# Patient Record
Sex: Male | Born: 1940
Health system: Southern US, Community
[De-identification: ages and names within clinical notes are randomized; demographics above are authoritative.]

## PROBLEM LIST (undated history)

## (undated) DIAGNOSIS — I700 Atherosclerosis of aorta: Secondary | ICD-10-CM

## (undated) DIAGNOSIS — I350 Nonrheumatic aortic (valve) stenosis: Secondary | ICD-10-CM

## (undated) DIAGNOSIS — J90 Pleural effusion, not elsewhere classified: Secondary | ICD-10-CM

## (undated) DIAGNOSIS — M1990 Unspecified osteoarthritis, unspecified site: Secondary | ICD-10-CM

## (undated) DIAGNOSIS — R011 Cardiac murmur, unspecified: Secondary | ICD-10-CM

## (undated) DIAGNOSIS — F40240 Claustrophobia: Secondary | ICD-10-CM

## (undated) DIAGNOSIS — Z7901 Long term (current) use of anticoagulants: Secondary | ICD-10-CM

## (undated) DIAGNOSIS — Z951 Presence of aortocoronary bypass graft: Secondary | ICD-10-CM

## (undated) DIAGNOSIS — Z8719 Personal history of other diseases of the digestive system: Secondary | ICD-10-CM

## (undated) DIAGNOSIS — I2510 Atherosclerotic heart disease of native coronary artery without angina pectoris: Secondary | ICD-10-CM

## (undated) DIAGNOSIS — R0600 Dyspnea, unspecified: Secondary | ICD-10-CM

## (undated) DIAGNOSIS — Z87442 Personal history of urinary calculi: Secondary | ICD-10-CM

## (undated) DIAGNOSIS — I429 Cardiomyopathy, unspecified: Secondary | ICD-10-CM

## (undated) DIAGNOSIS — R17 Unspecified jaundice: Secondary | ICD-10-CM

## (undated) DIAGNOSIS — I471 Supraventricular tachycardia, unspecified: Secondary | ICD-10-CM

## (undated) DIAGNOSIS — K5790 Diverticulosis of intestine, part unspecified, without perforation or abscess without bleeding: Secondary | ICD-10-CM

## (undated) DIAGNOSIS — Z8701 Personal history of pneumonia (recurrent): Secondary | ICD-10-CM

## (undated) DIAGNOSIS — K219 Gastro-esophageal reflux disease without esophagitis: Secondary | ICD-10-CM

## (undated) DIAGNOSIS — Z9849 Cataract extraction status, unspecified eye: Secondary | ICD-10-CM

## (undated) DIAGNOSIS — N183 Chronic kidney disease, stage 3 unspecified: Secondary | ICD-10-CM

## (undated) DIAGNOSIS — D126 Benign neoplasm of colon, unspecified: Secondary | ICD-10-CM

## (undated) DIAGNOSIS — J189 Pneumonia, unspecified organism: Secondary | ICD-10-CM

## (undated) DIAGNOSIS — I77810 Thoracic aortic ectasia: Secondary | ICD-10-CM

## (undated) DIAGNOSIS — I779 Disorder of arteries and arterioles, unspecified: Secondary | ICD-10-CM

## (undated) DIAGNOSIS — I10 Essential (primary) hypertension: Secondary | ICD-10-CM

## (undated) DIAGNOSIS — I480 Paroxysmal atrial fibrillation: Secondary | ICD-10-CM

## (undated) DIAGNOSIS — N400 Enlarged prostate without lower urinary tract symptoms: Secondary | ICD-10-CM

## (undated) DIAGNOSIS — H269 Unspecified cataract: Secondary | ICD-10-CM

## (undated) DIAGNOSIS — E785 Hyperlipidemia, unspecified: Secondary | ICD-10-CM

## (undated) DIAGNOSIS — I499 Cardiac arrhythmia, unspecified: Secondary | ICD-10-CM

## (undated) DIAGNOSIS — N189 Chronic kidney disease, unspecified: Secondary | ICD-10-CM

## (undated) DIAGNOSIS — I255 Ischemic cardiomyopathy: Secondary | ICD-10-CM

## (undated) DIAGNOSIS — Z961 Presence of intraocular lens: Secondary | ICD-10-CM

## (undated) DIAGNOSIS — E119 Type 2 diabetes mellitus without complications: Secondary | ICD-10-CM

## (undated) DIAGNOSIS — I4729 Other ventricular tachycardia: Secondary | ICD-10-CM

## (undated) DIAGNOSIS — D649 Anemia, unspecified: Secondary | ICD-10-CM

## (undated) HISTORY — DX: Hyperlipidemia, unspecified: E78.5

## (undated) HISTORY — DX: Unspecified cataract: H26.9

## (undated) HISTORY — DX: Pleural effusion, not elsewhere classified: J90

## (undated) HISTORY — DX: Personal history of other diseases of the digestive system: Z87.19

## (undated) HISTORY — DX: Nonrheumatic aortic (valve) stenosis: I35.0

## (undated) HISTORY — DX: Type 2 diabetes mellitus without complications: E11.9

## (undated) HISTORY — DX: Personal history of urinary calculi: Z87.442

## (undated) HISTORY — DX: Chronic kidney disease, unspecified: N18.9

## (undated) HISTORY — DX: Ischemic cardiomyopathy: I25.5

## (undated) HISTORY — PX: KNEE CARTILAGE SURGERY: SHX688

## (undated) HISTORY — DX: Personal history of pneumonia (recurrent): Z87.01

## (undated) HISTORY — PX: CATARACT EXTRACTION, BILATERAL: SHX1313

## (undated) HISTORY — DX: Gastro-esophageal reflux disease without esophagitis: K21.9

## (undated) HISTORY — PX: UMBILICAL HERNIA REPAIR: SHX196

## (undated) HISTORY — PX: CARPAL TUNNEL RELEASE: SHX101

## (undated) HISTORY — PX: LITHOTRIPSY: SUR834

## (undated) HISTORY — DX: Essential (primary) hypertension: I10

## (undated) HISTORY — DX: Paroxysmal atrial fibrillation: I48.0

---

## 2001-04-16 ENCOUNTER — Encounter: Payer: Self-pay | Admitting: Family Medicine

## 2001-04-16 ENCOUNTER — Encounter: Admission: RE | Admit: 2001-04-16 | Discharge: 2001-04-16 | Payer: Self-pay | Admitting: Family Medicine

## 2002-05-14 ENCOUNTER — Emergency Department (HOSPITAL_COMMUNITY): Admission: EM | Admit: 2002-05-14 | Discharge: 2002-05-14 | Payer: Self-pay | Admitting: *Deleted

## 2002-10-29 ENCOUNTER — Ambulatory Visit (HOSPITAL_COMMUNITY): Admission: RE | Admit: 2002-10-29 | Discharge: 2002-10-29 | Payer: Self-pay | Admitting: Specialist

## 2003-04-26 ENCOUNTER — Encounter: Payer: Self-pay | Admitting: Emergency Medicine

## 2003-04-26 ENCOUNTER — Emergency Department (HOSPITAL_COMMUNITY): Admission: EM | Admit: 2003-04-26 | Discharge: 2003-04-26 | Payer: Self-pay | Admitting: Emergency Medicine

## 2003-12-24 ENCOUNTER — Ambulatory Visit (HOSPITAL_COMMUNITY): Admission: RE | Admit: 2003-12-24 | Discharge: 2003-12-24 | Payer: Self-pay | Admitting: Family Medicine

## 2004-01-17 ENCOUNTER — Encounter: Admission: RE | Admit: 2004-01-17 | Discharge: 2004-01-17 | Payer: Self-pay | Admitting: Orthopedic Surgery

## 2004-08-24 ENCOUNTER — Emergency Department (HOSPITAL_COMMUNITY): Admission: EM | Admit: 2004-08-24 | Discharge: 2004-08-24 | Payer: Self-pay | Admitting: Emergency Medicine

## 2004-08-25 ENCOUNTER — Emergency Department (HOSPITAL_COMMUNITY): Admission: EM | Admit: 2004-08-25 | Discharge: 2004-08-26 | Payer: Self-pay | Admitting: Emergency Medicine

## 2004-09-15 ENCOUNTER — Ambulatory Visit (HOSPITAL_COMMUNITY): Admission: RE | Admit: 2004-09-15 | Discharge: 2004-09-15 | Payer: Self-pay | Admitting: Urology

## 2008-09-11 DIAGNOSIS — E119 Type 2 diabetes mellitus without complications: Secondary | ICD-10-CM

## 2008-09-11 HISTORY — DX: Type 2 diabetes mellitus without complications: E11.9

## 2009-10-16 ENCOUNTER — Emergency Department (HOSPITAL_COMMUNITY): Admission: EM | Admit: 2009-10-16 | Discharge: 2009-10-16 | Payer: Self-pay | Admitting: Emergency Medicine

## 2010-02-01 LAB — COMPREHENSIVE METABOLIC PANEL
ALT: 66 U/L — AB (ref 10–40)
BUN: 19 mg/dL (ref 4–21)
Creat: 0.95
Sodium: 137 mmol/L (ref 137–147)
Total Bilirubin: 0.9 mg/dL

## 2010-02-01 LAB — TSH: TSH: 2.87 u[IU]/mL (ref 0.41–5.90)

## 2010-02-01 LAB — CBC
Hemoglobin: 14.4 g/dL (ref 13.5–17.5)
platelet count: 182

## 2010-11-30 LAB — URINALYSIS, ROUTINE W REFLEX MICROSCOPIC
Glucose, UA: NEGATIVE mg/dL
Ketones, ur: NEGATIVE mg/dL
Specific Gravity, Urine: 1.023 (ref 1.005–1.030)
Urobilinogen, UA: 0.2 mg/dL (ref 0.0–1.0)

## 2010-12-26 LAB — PSA: PSA: 0.7 ng/mL

## 2011-10-05 ENCOUNTER — Encounter: Payer: Self-pay | Admitting: Family Medicine

## 2011-10-05 ENCOUNTER — Ambulatory Visit (INDEPENDENT_AMBULATORY_CARE_PROVIDER_SITE_OTHER): Payer: Medicare Other | Admitting: Family Medicine

## 2011-10-05 DIAGNOSIS — E1169 Type 2 diabetes mellitus with other specified complication: Secondary | ICD-10-CM | POA: Insufficient documentation

## 2011-10-05 DIAGNOSIS — E119 Type 2 diabetes mellitus without complications: Secondary | ICD-10-CM | POA: Diagnosis not present

## 2011-10-05 DIAGNOSIS — Z87442 Personal history of urinary calculi: Secondary | ICD-10-CM

## 2011-10-05 DIAGNOSIS — I10 Essential (primary) hypertension: Secondary | ICD-10-CM | POA: Insufficient documentation

## 2011-10-05 DIAGNOSIS — E785 Hyperlipidemia, unspecified: Secondary | ICD-10-CM | POA: Diagnosis not present

## 2011-10-05 DIAGNOSIS — E118 Type 2 diabetes mellitus with unspecified complications: Secondary | ICD-10-CM | POA: Insufficient documentation

## 2011-10-05 DIAGNOSIS — K219 Gastro-esophageal reflux disease without esophagitis: Secondary | ICD-10-CM | POA: Insufficient documentation

## 2011-10-05 NOTE — Progress Notes (Signed)
Subjective:    Patient ID: [name] Young., male    DOB: 1941/03/13, 71 y.o.   MRN: [id number]  HPI CC: new pt  New pt to establish, prior saw Delia Chimes.  She is leaving.  Presents with wife and grandson who they take care of.  Brings copy of records from prior PCP  DM - dx 2010.  Compliant with meds.  Since increase in metformin to 500mg  bid, notices bowel urgency intermittently, but not significantly bothering him.  Last vision check was 6 mo ago.  To get laser surgery upcoming.    HTN - compliant with meds, good control.  No HA, CP/tightness, SOB, leg swelling.   HLD - on fish oil, simcor, trilipix.  No myalgias.  H/o kidney stones, 2 in left kidney and 3 in right.  Latest lithotripsy x2 ~2011.  Sees Dr. [name].  Has had prostate checked there.  Has pain meds at home.  Weight down 40 lbs since seeing Delia Chimes, has been eating healthier and trying to lose weight for better diabetes control.  Preventative: Last CPE long time ago.  Due for this. Prostate check ok at urologist, sees yearly.  Medications and allergies reviewed and updated in chart.  Past histories reviewed and updated if relevant as below. Patient Active Problem List  Diagnoses  . T2DM (type 2 diabetes mellitus)  . HTN (hypertension)  . HLD (hyperlipidemia)  . History of kidney stones  . GERD (gastroesophageal reflux disease)   Past Medical History  Diagnosis Date  . T2DM (type 2 diabetes mellitus) 2010  . GERD (gastroesophageal reflux disease)   . HTN (hypertension)   . HLD (hyperlipidemia)   . History of kidney stones    Past Surgical History  Procedure Date  . Umbilical hernia repair     with mesh  . Knee cartilage surgery     left  . Carpal tunnel release     bilateral   History  Substance Use Topics  . Smoking status: Never Smoker   . Smokeless tobacco: Former Systems developer    Types: [place] date: 09/08/2001  . Alcohol Use: No   Family History  Problem Relation  Age of Onset  . Alzheimer's disease Mother   . Cancer Mother     breast  . Stroke Father 54  . Cancer Maternal Grandfather     rectal  . Coronary artery disease Neg Hx   . Diabetes Father    No Known Allergies No current outpatient prescriptions on file prior to visit.    Review of Systems  Constitutional: Negative for fever, chills, activity change, appetite change, fatigue and unexpected weight change.  HENT: Negative for hearing loss and neck pain.   Eyes: Positive for visual disturbance (see HPI).  Respiratory: Negative for cough, chest tightness, shortness of breath and wheezing.   Cardiovascular: Negative for chest pain, palpitations and leg swelling.  Gastrointestinal: Positive for diarrhea. Negative for nausea, vomiting, abdominal pain, constipation, blood in stool and abdominal distention.  Genitourinary: Negative for hematuria and difficulty urinating.  Musculoskeletal: Negative for myalgias and arthralgias.  Skin: Negative for rash.  Neurological: Negative for dizziness, seizures, syncope and headaches.  Hematological: Does not bruise/bleed easily.  Psychiatric/Behavioral: Negative for dysphoric mood. The patient is not nervous/anxious.        Objective:   Physical Exam  Nursing note and vitals reviewed. Constitutional: He is oriented to person, place, and time. He appears well-developed and well-nourished. No distress.  obese  HENT:  Head: Normocephalic and atraumatic.  Right Ear: External ear normal.  Left Ear: External ear normal.  Nose: Nose normal.  Mouth/Throat: Oropharynx is clear and moist. No oropharyngeal exudate.  Eyes: Conjunctivae and EOM are normal. Pupils are equal, round, and reactive to light. No scleral icterus.  Neck: Normal range of motion. Neck supple. No thyromegaly present.  Cardiovascular: Normal rate, regular rhythm, normal heart sounds and intact distal pulses.   No murmur heard. Pulses:      Radial pulses are 2+ on the right  side, and 2+ on the left side.  Pulmonary/Chest: Effort normal and breath sounds normal. No respiratory distress. He has no wheezes. He has no rales.  Abdominal: Soft. Bowel sounds are normal. He exhibits no distension and no mass. There is no tenderness. There is no rebound and no guarding.  Musculoskeletal: Normal range of motion.       Diabetic foot exam: Normal inspection No skin breakdown No calluses  Normal DP/PT pulses Normal sensation to light touch and monofilament Nails normal  Lymphadenopathy:    He has no cervical adenopathy.  Neurological: He is alert and oriented to person, place, and time.       CN grossly intact, station and gait intact  Skin: Skin is warm and dry. No rash noted.  Psychiatric: He has a normal mood and affect. His behavior is normal. Judgment and thought content normal.       Assessment & Plan:

## 2011-10-05 NOTE — Patient Instructions (Addendum)
Return at your convenience fasting for blood work, afterwards for wellness visit (physical).  We will go over labs when you return. We will check diabetes at blood work.   Good to meet you today, call us with questions. We will continue metformin for now.  May consider changing to long acting medicine.

## 2011-10-06 ENCOUNTER — Other Ambulatory Visit (INDEPENDENT_AMBULATORY_CARE_PROVIDER_SITE_OTHER): Payer: Medicare Other

## 2011-10-06 DIAGNOSIS — Z125 Encounter for screening for malignant neoplasm of prostate: Secondary | ICD-10-CM | POA: Diagnosis not present

## 2011-10-06 DIAGNOSIS — E119 Type 2 diabetes mellitus without complications: Secondary | ICD-10-CM | POA: Diagnosis not present

## 2011-10-06 DIAGNOSIS — I10 Essential (primary) hypertension: Secondary | ICD-10-CM | POA: Diagnosis not present

## 2011-10-06 LAB — COMPREHENSIVE METABOLIC PANEL
ALT: 15 U/L (ref 0–53)
AST: 19 U/L (ref 0–37)
Alkaline Phosphatase: 33 U/L — ABNORMAL LOW (ref 39–117)
BUN: 19 mg/dL (ref 6–23)
Creatinine, Ser: 1.1 mg/dL (ref 0.4–1.5)
GFR: 73.56 mL/min (ref >60.00)
Potassium: 4.7 mEq/L (ref 3.5–5.1)
Sodium: 141 mEq/L (ref 135–145)
Total Protein: 6.7 g/dL (ref 6.0–8.3)

## 2011-10-06 LAB — LIPID PANEL
Cholesterol: 112 mg/dL (ref 0–200)
HDL: 33.3 mg/dL — ABNORMAL LOW (ref >39.00)
LDL Cholesterol: 47 mg/dL (ref 0–99)
Total CHOL/HDL Ratio: 3
Triglycerides: 158 mg/dL — ABNORMAL HIGH (ref 0.0–149.0)

## 2011-10-06 LAB — HEMOGLOBIN A1C: Hgb A1c MFr Bld: 5.8 % (ref 4.6–6.5)

## 2011-10-07 ENCOUNTER — Encounter: Payer: Self-pay | Admitting: Family Medicine

## 2011-10-07 LAB — PSA, MEDICARE: PSA: 0.71 ng/mL (ref <=4.00)

## 2011-10-07 NOTE — Assessment & Plan Note (Signed)
Not currently active.  Has residual bilateral stones.

## 2011-10-07 NOTE — Assessment & Plan Note (Signed)
States good control, will check A1c when returns fasting and afterwards return for medicare wellness visit.

## 2011-10-07 NOTE — Assessment & Plan Note (Signed)
On simcor, trilipix and fish oil Review records, check FLP, LFTs when returns fasting. Denies myalgias.

## 2011-10-07 NOTE — Assessment & Plan Note (Signed)
Stable off meds, await records.

## 2011-10-07 NOTE — Assessment & Plan Note (Signed)
Good control, continue current meds.  Review records.

## 2011-10-10 ENCOUNTER — Encounter: Payer: Self-pay | Admitting: Family Medicine

## 2011-10-11 ENCOUNTER — Ambulatory Visit (INDEPENDENT_AMBULATORY_CARE_PROVIDER_SITE_OTHER): Payer: Medicare Other | Admitting: Family Medicine

## 2011-10-11 ENCOUNTER — Encounter: Payer: Self-pay | Admitting: Family Medicine

## 2011-10-11 VITALS — BP 138/76 | HR 72 | Temp 98.0°F | Ht 71.0 in | Wt 266.2 lb

## 2011-10-11 DIAGNOSIS — Z0110 Encounter for examination of ears and hearing without abnormal findings: Secondary | ICD-10-CM

## 2011-10-11 DIAGNOSIS — E785 Hyperlipidemia, unspecified: Secondary | ICD-10-CM | POA: Diagnosis not present

## 2011-10-11 DIAGNOSIS — Z0000 Encounter for general adult medical examination without abnormal findings: Secondary | ICD-10-CM | POA: Insufficient documentation

## 2011-10-11 DIAGNOSIS — E119 Type 2 diabetes mellitus without complications: Secondary | ICD-10-CM

## 2011-10-11 DIAGNOSIS — Z23 Encounter for immunization: Secondary | ICD-10-CM | POA: Diagnosis not present

## 2011-10-11 MED ORDER — NIACIN-SIMVASTATIN ER 1000-20 MG PO TB24: 1.0000 | ORAL_TABLET | Freq: Every day | ORAL | Status: DC

## 2011-10-11 NOTE — Assessment & Plan Note (Addendum)
I have personally reviewed the Medicare Annual Wellness questionnaire and have noted 1. The patient's medical and social history 2. Their use of alcohol, tobacco or illicit drugs 3. Their current medications and supplements 4. The patient's functional ability including ADL's, fall risks, home safety risks and hearing or visual impairment. 5. Diet and physical activities 6. Evidence for depression or mood disorders The patients weight, height, BMI have been recorded in the chart.  Hearing and vision has been addressed. I have made referrals, counseling and provided education to the patient based review of the above and I have provided the pt with a written personalized care plan for preventive services.  Pneumovax and Td today. Hearing screen today. Discussed healthy diet, living. iFOB today (h/o hemorrhoids but not currently active)

## 2011-10-11 NOTE — Assessment & Plan Note (Signed)
Reviewed numbers with pt. Great control, h/o trig in 70s. On 4 meds, concern for overtreatment. Decrease simvastatin component to 20mg  daily.

## 2011-10-11 NOTE — Patient Instructions (Addendum)
We've dropped your simcor to 1000-20mg  once daily. Hearing screen today. iFOB (stool kit) today. Schedule vision screen for 11/2011. Good to see you today, call us with quesitons. Pneumonia and tetanus shot today (Td). Return in 6 months for follow up, fasting prior for blood work.

## 2011-10-11 NOTE — Progress Notes (Signed)
Subjective:    Patient ID: [name] Young., male    DOB: 1941-03-18, 71 y.o.   MRN: [id number]  HPI CC: medicare wellness visit  metfomrin 1000mg  bid.  This causes some loose stools but pt tolerating overall well.  HLD - reviewed blood work, no myalgias.  Will drop statin component to 20mg  daily.  Wt Readings from Last 3 Encounters:  10/11/11 266 lb 4 oz (120.77 kg)  10/05/11 264 lb 12 oz (120.09 kg)   Preventative:  Reviewed blood work in detail. Has not had medicare wellness visit in past. Prostate check ok at urologist, sees yearly.  Due for this, will schedule. Dur for vision screen 11/2011.  Last was 11/2010.  To have laser surgery. Tetanus today.  Pneumonia shot today. H/o pneumonia. Has had flu shot.  Colon - colonoscopy vs flex sig ~1999 normal.  Requests iFOB.  H/o hemorrhoids. 3 falls in last year, denies problems, states tripped over brush, wire "self inflicted" No depression.  No anhedonia.  Medications and allergies reviewed and updated in chart.  Past histories reviewed and updated if relevant as below. Patient Active Problem List  Diagnoses  . T2DM (type 2 diabetes mellitus)  . HTN (hypertension)  . HLD (hyperlipidemia)  . History of kidney stones  . GERD (gastroesophageal reflux disease)   Past Medical History  Diagnosis Date  . T2DM (type 2 diabetes mellitus) 2010  . GERD (gastroesophageal reflux disease)   . HTN (hypertension)   . HLD (hyperlipidemia)   . History of kidney stones     [name] @ [facility])  . History of cholelithiasis    Past Surgical History  Procedure Date  . Umbilical hernia repair     with mesh  . Knee cartilage surgery     left  . Carpal tunnel release     bilateral   History  Substance Use Topics  . Smoking status: Never Smoker   . Smokeless tobacco: Former Systems developer    Types: [place] date: 09/08/2001  . Alcohol Use: No   Family History  Problem Relation Age of Onset  . Alzheimer's disease Mother   . Cancer  Mother     breast  . Stroke Father 56  . Cancer Maternal Grandfather     rectal  . Coronary artery disease Neg Hx   . Diabetes Father    No Known Allergies Current Outpatient Prescriptions on File Prior to Visit  Medication Sig Dispense Refill  . aspirin 325 MG tablet Take 325 mg by mouth daily.      . Cholecalciferol (VITAMIN D) 2000 UNITS CAPS Take 1 capsule by mouth daily.      . Choline Fenofibrate (TRILIPIX) 135 MG capsule Take 135 mg by mouth daily.      Marland Kitchen lisinopril (PRINIVIL,ZESTRIL) 20 MG tablet Take 20 mg by mouth daily.      . Misc Natural Products (OSTEO BI-FLEX JOINT SHIELD PO) Take 2 tablets by mouth daily.      . Multiple Vitamins-Minerals (MACULAR VITAMIN BENEFIT PO) Take 1 tablet by mouth daily.      . Niacin-Simvastatin [facility]) 1000-40 MG TB24 Take 1 tablet by mouth daily.      . Omega-3 Fatty Acids (FISH OIL) 1200 MG CAPS Take 1 capsule by mouth 3 (three) times daily.         Review of Systems  Constitutional: Negative for fever, chills, activity change, appetite change, fatigue and unexpected weight change.  HENT: Negative for hearing loss and neck  pain.   Eyes: Negative for visual disturbance.  Respiratory: Negative for cough, chest tightness, shortness of breath and wheezing.   Cardiovascular: Negative for chest pain, palpitations and leg swelling.  Gastrointestinal: Positive for diarrhea. Negative for nausea, vomiting, abdominal pain, constipation, blood in stool and abdominal distention.  Genitourinary: Negative for hematuria and difficulty urinating.  Musculoskeletal: Negative for myalgias and arthralgias.  Skin: Negative for rash.  Neurological: Negative for dizziness, seizures, syncope and headaches.  Hematological: Does not bruise/bleed easily.  Psychiatric/Behavioral: Negative for dysphoric mood. The patient is not nervous/anxious.        Objective:   Physical Exam  Nursing note and vitals reviewed. Constitutional: He is oriented to person,  place, and time. He appears well-developed and well-nourished. No distress.  HENT:  Head: Normocephalic and atraumatic.  Right Ear: External ear normal.  Left Ear: External ear normal.  Nose: Nose normal.  Mouth/Throat: Oropharynx is clear and moist. No oropharyngeal exudate.  Eyes: Conjunctivae and EOM are normal. Pupils are equal, round, and reactive to light. No scleral icterus.  Neck: Normal range of motion. Neck supple.  Cardiovascular: Normal rate, regular rhythm, normal heart sounds and intact distal pulses.   No murmur heard. Pulses:      Radial pulses are 2+ on the right side, and 2+ on the left side.  Pulmonary/Chest: Effort normal and breath sounds normal. No respiratory distress. He has no wheezes. He has no rales.  Abdominal: Soft. Bowel sounds are normal. He exhibits no distension and no mass. There is no tenderness. There is no rebound and no guarding.  Musculoskeletal: Normal range of motion.  Lymphadenopathy:    He has no cervical adenopathy.  Neurological: He is alert and oriented to person, place, and time.       CN grossly intact, station and gait intact  Skin: Skin is warm and dry. No rash noted.  Psychiatric: He has a normal mood and affect. His behavior is normal. Judgment and thought content normal.      Assessment & Plan:

## 2011-10-11 NOTE — Assessment & Plan Note (Signed)
Great control as evidenced by last A1c. Pt prefers to continue metformin 1000mg  bid. Does not want to drop dose.

## 2011-10-18 ENCOUNTER — Other Ambulatory Visit: Payer: Medicare Other

## 2011-10-18 DIAGNOSIS — Z1211 Encounter for screening for malignant neoplasm of colon: Secondary | ICD-10-CM

## 2011-10-18 LAB — FECAL OCCULT BLOOD, IMMUNOCHEMICAL: Fecal Occult Bld: NEGATIVE

## 2011-10-19 ENCOUNTER — Encounter: Payer: Self-pay | Admitting: *Deleted

## 2011-11-13 ENCOUNTER — Other Ambulatory Visit: Payer: Self-pay | Admitting: *Deleted

## 2011-11-13 MED ORDER — CHOLINE FENOFIBRATE 135 MG PO CPDR: 135.0000 mg | DELAYED_RELEASE_CAPSULE | Freq: Every day | ORAL | Status: DC

## 2011-11-15 ENCOUNTER — Other Ambulatory Visit: Payer: Self-pay | Admitting: *Deleted

## 2011-11-15 MED ORDER — LISINOPRIL 20 MG PO TABS: 20.0000 mg | ORAL_TABLET | Freq: Every day | ORAL | Status: DC

## 2011-12-06 DIAGNOSIS — H26499 Other secondary cataract, unspecified eye: Secondary | ICD-10-CM | POA: Diagnosis not present

## 2011-12-06 DIAGNOSIS — H353190 Nonexudative age-related macular degeneration, unspecified eye, stage unspecified: Secondary | ICD-10-CM | POA: Diagnosis not present

## 2011-12-06 DIAGNOSIS — E119 Type 2 diabetes mellitus without complications: Secondary | ICD-10-CM | POA: Diagnosis not present

## 2012-03-28 ENCOUNTER — Other Ambulatory Visit: Payer: Self-pay | Admitting: Family Medicine

## 2012-03-28 DIAGNOSIS — E119 Type 2 diabetes mellitus without complications: Secondary | ICD-10-CM

## 2012-03-28 DIAGNOSIS — E785 Hyperlipidemia, unspecified: Secondary | ICD-10-CM

## 2012-04-02 ENCOUNTER — Other Ambulatory Visit (INDEPENDENT_AMBULATORY_CARE_PROVIDER_SITE_OTHER): Payer: Medicare Other

## 2012-04-02 DIAGNOSIS — E785 Hyperlipidemia, unspecified: Secondary | ICD-10-CM

## 2012-04-02 DIAGNOSIS — E119 Type 2 diabetes mellitus without complications: Secondary | ICD-10-CM

## 2012-04-02 LAB — LIPID PANEL
Total CHOL/HDL Ratio: 3
Triglycerides: 223 mg/dL — ABNORMAL HIGH (ref 0.0–149.0)

## 2012-04-02 LAB — HEMOGLOBIN A1C: Hgb A1c MFr Bld: 6.2 % (ref 4.6–6.5)

## 2012-04-02 LAB — COMPREHENSIVE METABOLIC PANEL
Alkaline Phosphatase: 26 U/L — ABNORMAL LOW (ref 39–117)
BUN: 22 mg/dL (ref 6–23)
CO2: 27 mEq/L (ref 19–32)
Calcium: 9.3 mg/dL (ref 8.4–10.5)
Chloride: 106 mEq/L (ref 96–112)
GFR: 68.93 mL/min (ref >60.00)
Glucose, Bld: 116 mg/dL — ABNORMAL HIGH (ref 70–99)
Potassium: 4.6 mEq/L (ref 3.5–5.1)
Sodium: 141 mEq/L (ref 135–145)

## 2012-04-02 LAB — LDL CHOLESTEROL, DIRECT: Direct LDL: 61.5 mg/dL

## 2012-04-02 LAB — MICROALBUMIN / CREATININE URINE RATIO
Microalb Creat Ratio: 0.5 mg/g (ref 0.0–30.0)
Microalb, Ur: 0.8 mg/dL (ref 0.0–1.9)

## 2012-04-09 ENCOUNTER — Ambulatory Visit (INDEPENDENT_AMBULATORY_CARE_PROVIDER_SITE_OTHER): Payer: Medicare Other | Admitting: Family Medicine

## 2012-04-09 ENCOUNTER — Encounter: Payer: Self-pay | Admitting: Family Medicine

## 2012-04-09 VITALS — BP 150/80 | HR 60 | Temp 98.1°F | Wt 278.2 lb

## 2012-04-09 DIAGNOSIS — E119 Type 2 diabetes mellitus without complications: Secondary | ICD-10-CM

## 2012-04-09 DIAGNOSIS — E785 Hyperlipidemia, unspecified: Secondary | ICD-10-CM

## 2012-04-09 DIAGNOSIS — I10 Essential (primary) hypertension: Secondary | ICD-10-CM

## 2012-04-09 NOTE — Assessment & Plan Note (Signed)
Chronic, stable. Continue meds. Trig elevated but LDL good control.

## 2012-04-09 NOTE — Patient Instructions (Signed)
Keep an eye on your blood pressure - if consistently >140/90, call me for sooner appointment.   return in 6 months for next physical Cholesterol and sugar looking good. Good to see you today, call us with questions.

## 2012-04-09 NOTE — Assessment & Plan Note (Signed)
Chronic, stable. Continue meds. No evidence of periph neuropathy on exam today.  UTD ophtho and foot exam.  May need laser surgery.

## 2012-04-09 NOTE — Assessment & Plan Note (Signed)
Chronic, deteriorated. Continue ACEI.  If bp staying elevated, return sooner for change in med. O/w return in 6 mo.

## 2012-04-09 NOTE — Progress Notes (Signed)
  Subjective:    Patient ID: [name] Luna., male    DOB: [birth year], 71 y.o.   MRN: [id number]  HPI CC: 6 mo f/u  Noticing toes more sensitive, esp when cutting toenails.  DM - well controlled at home.  Checks fasting in am.  Highs of 120s.  Saw eye doctor 3 mo ago.  Told needs laser therapy to eyes, pending this.  Denies paresthesias.  On metformin 1000mg  bid. Lab Results  Component Value Date   HGBA1C 6.2 04/02/2012    HTN - bp elevated today.  Ate country ham recently, thinks this may have raised #s.  At home bp runs 130-140/60-70.  No HA, CP/tightness, SOB, leg swelling.  Dyslipidemia - compliant with 4 meds.  Denies myalgias.  Recently decreased simvastatin component to 20mg  daily, diarrhea improved with this.  Wt Readings from Last 3 Encounters:  04/09/12 278 lb 4 oz (126.213 kg)  10/11/11 266 lb 4 oz (120.77 kg)  10/05/11 264 lb 12 oz (120.09 kg)   gained 10 lbs.  Not very active.  Review of Systems Per HPI    Objective:   Physical Exam  Nursing note and vitals reviewed. Constitutional: He appears well-developed and well-nourished. No distress.  HENT:  Head: Normocephalic and atraumatic.  Right Ear: External ear normal.  Left Ear: External ear normal.  Nose: Nose normal.  Mouth/Throat: Oropharynx is clear and moist. No oropharyngeal exudate.  Eyes: Conjunctivae and EOM are normal. Pupils are equal, round, and reactive to light. No scleral icterus.  Neck: Normal range of motion. Neck supple.  Cardiovascular: Normal rate, regular rhythm, normal heart sounds and intact distal pulses.   No murmur heard. Pulses:      Dorsalis pedis pulses are 2+ on the right side, and 2+ on the left side.  Pulmonary/Chest: Effort normal and breath sounds normal. No respiratory distress. He has no wheezes. He has no rales.  Musculoskeletal: He exhibits no edema.       Diabetic foot exam: Normal inspection No skin breakdown No calluses  Normal DP/PT pulses Normal sensation to  light tough and monofilament Nails normal  Lymphadenopathy:    He has no cervical adenopathy.  Skin: Skin is warm and dry. No rash noted.  Psychiatric: He has a normal mood and affect.       Assessment & Plan:

## 2012-04-25 ENCOUNTER — Other Ambulatory Visit: Payer: Self-pay | Admitting: *Deleted

## 2012-04-25 MED ORDER — METFORMIN HCL 1000 MG PO TABS: 1000.0000 mg | ORAL_TABLET | Freq: Two times a day (BID) | ORAL | Status: DC

## 2012-06-10 ENCOUNTER — Other Ambulatory Visit: Payer: Self-pay | Admitting: Family Medicine

## 2012-07-17 DIAGNOSIS — Z23 Encounter for immunization: Secondary | ICD-10-CM | POA: Diagnosis not present

## 2012-07-29 DIAGNOSIS — N200 Calculus of kidney: Secondary | ICD-10-CM | POA: Diagnosis not present

## 2012-08-16 DIAGNOSIS — N200 Calculus of kidney: Secondary | ICD-10-CM | POA: Diagnosis not present

## 2012-08-24 ENCOUNTER — Other Ambulatory Visit: Payer: Self-pay | Admitting: Family Medicine

## 2012-09-10 ENCOUNTER — Other Ambulatory Visit: Payer: Self-pay | Admitting: *Deleted

## 2012-09-28 ENCOUNTER — Other Ambulatory Visit: Payer: Self-pay | Admitting: Family Medicine

## 2012-09-28 DIAGNOSIS — E119 Type 2 diabetes mellitus without complications: Secondary | ICD-10-CM

## 2012-09-28 DIAGNOSIS — E785 Hyperlipidemia, unspecified: Secondary | ICD-10-CM

## 2012-09-28 DIAGNOSIS — I10 Essential (primary) hypertension: Secondary | ICD-10-CM

## 2012-09-28 DIAGNOSIS — Z125 Encounter for screening for malignant neoplasm of prostate: Secondary | ICD-10-CM

## 2012-10-04 ENCOUNTER — Other Ambulatory Visit (INDEPENDENT_AMBULATORY_CARE_PROVIDER_SITE_OTHER): Payer: Medicare Other

## 2012-10-04 DIAGNOSIS — E119 Type 2 diabetes mellitus without complications: Secondary | ICD-10-CM | POA: Diagnosis not present

## 2012-10-04 DIAGNOSIS — Z125 Encounter for screening for malignant neoplasm of prostate: Secondary | ICD-10-CM

## 2012-10-04 DIAGNOSIS — E785 Hyperlipidemia, unspecified: Secondary | ICD-10-CM

## 2012-10-04 LAB — COMPREHENSIVE METABOLIC PANEL
ALT: 19 U/L (ref 0–53)
CO2: 26 mEq/L (ref 19–32)
Calcium: 9.8 mg/dL (ref 8.4–10.5)
Chloride: 105 mEq/L (ref 96–112)
GFR: 58.48 mL/min — ABNORMAL LOW (ref >60.00)
Sodium: 142 mEq/L (ref 135–145)
Total Bilirubin: 0.9 mg/dL (ref 0.3–1.2)
Total Protein: 6.8 g/dL (ref 6.0–8.3)

## 2012-10-04 LAB — LIPID PANEL: Total CHOL/HDL Ratio: 4

## 2012-10-04 LAB — MICROALBUMIN / CREATININE URINE RATIO: Creatinine,U: 175.5 mg/dL

## 2012-10-09 ENCOUNTER — Other Ambulatory Visit: Payer: Self-pay | Admitting: Family Medicine

## 2012-10-11 ENCOUNTER — Ambulatory Visit (INDEPENDENT_AMBULATORY_CARE_PROVIDER_SITE_OTHER): Payer: Medicare Other | Admitting: Family Medicine

## 2012-10-11 ENCOUNTER — Encounter: Payer: Self-pay | Admitting: Family Medicine

## 2012-10-11 VITALS — BP 126/80 | HR 72 | Temp 98.1°F | Ht 70.0 in | Wt 272.0 lb

## 2012-10-11 DIAGNOSIS — E785 Hyperlipidemia, unspecified: Secondary | ICD-10-CM

## 2012-10-11 DIAGNOSIS — I10 Essential (primary) hypertension: Secondary | ICD-10-CM

## 2012-10-11 DIAGNOSIS — E119 Type 2 diabetes mellitus without complications: Secondary | ICD-10-CM

## 2012-10-11 DIAGNOSIS — Z0000 Encounter for general adult medical examination without abnormal findings: Secondary | ICD-10-CM | POA: Diagnosis not present

## 2012-10-11 DIAGNOSIS — Z1211 Encounter for screening for malignant neoplasm of colon: Secondary | ICD-10-CM | POA: Diagnosis not present

## 2012-10-11 DIAGNOSIS — R1011 Right upper quadrant pain: Secondary | ICD-10-CM | POA: Diagnosis not present

## 2012-10-11 MED ORDER — SIMVASTATIN 20 MG PO TABS: 20.0000 mg | ORAL_TABLET | Freq: Every day | ORAL | Status: DC

## 2012-10-11 NOTE — Assessment & Plan Note (Signed)
Chronic, stable.  Good control on current meds. BP Readings from Last 3 Encounters:  10/11/12 126/80  04/09/12 150/80  10/11/11 138/76

## 2012-10-11 NOTE — Assessment & Plan Note (Signed)
On trilipix, simcor (1000/20), and fish oil 3gm daily. simcor very expensive - will switch to simvastatin alone, recheck FLP in 6 months.

## 2012-10-11 NOTE — Assessment & Plan Note (Signed)
Chronic, controlled with metformin.  Continue med.  Evidence of some periph neuropathy, but pt declines gabapentin.  Discussed importance of monitoring feet. Lab Results  Component Value Date   HGBA1C 6.1 10/04/2012   Lab Results  Component Value Date   CREATININE 1.3 10/04/2012

## 2012-10-11 NOTE — Assessment & Plan Note (Signed)
I have personally reviewed the Medicare Annual Wellness questionnaire and have noted 1. The patient's medical and social history 2. Their use of alcohol, tobacco or illicit drugs 3. Their current medications and supplements 4. The patient's functional ability including ADL's, fall risks, home safety risks and hearing or visual impairment. 5. Diet and physical activity 6. Evidence for depression or mood disorders The patients weight, height, BMI have been recorded in the chart.  Hearing and vision has been addressed. I have made referrals, counseling and provided education to the patient based review of the above and I have provided the pt with a written personalized care plan for preventive services. See scanned questionairre. Advanced directives not discussed.  Reviewed preventative protocols and updated unless pt declined. UTD pneumovax, Td, flu.  Declines shingles shot. iFOB today. PSA/DRE reassuring today.

## 2012-10-11 NOTE — Progress Notes (Signed)
Subjective:    Patient ID: [name] Young., male    DOB: 1941/07/30, 72 y.o.   MRN: [id number]  HPI CC: medicare wellness visit  HLD - on simcor (1000/20), trilipix 135mg , and fish oil 3g daily.  Tolerant of meds, no myalgias, but simcor is very expensive ($64).  Feet bothering him.  Progressively getting worse.  Feels like "leather on bottom of feet".  + numbness.  Known h/o gallstones - occasional sharp RUQ pain +indigestion with bloating and gassiness.  Notes worse with gassy foods.  More bothersome when laying down.  If has pain, when stands up pain resolves.  DM - last foot exam 03/2012.  Compliant with metformin bid. Lab Results  Component Value Date   HGBA1C 6.1 10/04/2012    HTN - lisinopril 20mg  daily.  Complaint with med.  Obesity. Body mass index is 39.03 kg/(m^2).  Wt Readings from Last 3 Encounters:  10/11/12 272 lb (123.378 kg)  04/09/12 278 lb 4 oz (126.213 kg)  10/11/11 266 lb 4 oz (120.77 kg)    Preventative:  Reviewed blood work in detail.  Did not have prostate check at latest urologist, last saw Dr. Karsten Ro (prior saw Dr. [name]).  requests screening today. Colon - colonoscopy vs flex sig ~1999 normal. Requests iFOB. H/o hemorrhoids.  Pneumovax 09/2011 Td 09/2011 Flu shot - done Shingles shot - declines  No falls this year. Denies depression. No anhedonia.  No sadness.  Vision screen passed Hearing screen passed.  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 Diet: some water, fruits/vegetables daily, avoids potatoes  Medications and allergies reviewed and updated in chart.  Past histories reviewed and updated if relevant as below. Patient Active Problem List  Diagnosis  . T2DM (type 2 diabetes mellitus)  . HTN (hypertension)  . HLD (hyperlipidemia)  . History of kidney stones  . GERD (gastroesophageal reflux disease)  . Medicare annual  wellness visit, initial   Past Medical History  Diagnosis Date  . T2DM (type 2 diabetes mellitus) 2010  . GERD (gastroesophageal reflux disease)   . HTN (hypertension)   . HLD (hyperlipidemia)   . History of kidney stones     [name] @ [facility]) now new doctor  . History of cholelithiasis   . History of pneumonia    Past Surgical History  Procedure Date  . Umbilical hernia repair     with mesh  . Knee cartilage surgery     left  . Carpal tunnel release     bilateral   History  Substance Use Topics  . Smoking status: Never Smoker   . Smokeless tobacco: Former Systems developer    Types: [place] date: 09/08/2001  . Alcohol Use: No   Family History  Problem Relation Age of Onset  . Alzheimer's disease Mother   . Cancer Mother     breast  . Stroke Father 27  . Cancer Maternal Grandfather     rectal  . Diabetes Father   . CAD Neg Hx    No Known Allergies Current Outpatient Prescriptions on File Prior to Visit  Medication Sig Dispense Refill  . aspirin 325 MG tablet Take 325 mg by mouth daily.      . Cholecalciferol (VITAMIN D) 2000 UNITS CAPS Take 1 capsule by mouth daily.      . Choline Fenofibrate (TRILIPIX) 135 MG capsule Take 1 capsule (135 mg total) by mouth daily.  30 capsule  11  . lisinopril (PRINIVIL,ZESTRIL) 20 MG tablet TAKE ONE (1) TABLET BY MOUTH EVERY DAY  30 tablet  11  . metFORMIN (GLUCOPHAGE) 1000 MG tablet TAKE ONE (1) TABLET BY MOUTH TWO (2)    TIMES DAILY WITH A MEAL  60 tablet  6  . Misc Natural Products (OSTEO BI-FLEX JOINT SHIELD PO) Take 2 tablets by mouth daily.      . Multiple Vitamins-Minerals (MACULAR VITAMIN BENEFIT PO) Take 1 tablet by mouth daily.      . Omega-3 Fatty Acids (FISH OIL) 1200 MG CAPS Take 1 capsule by mouth 3 (three) times daily.      . niacin-simvastatin (SIMCOR) 1000-20 MG 24 hr tablet Take 1 tablet by mouth at bedtime.  30 tablet  11     Review of Systems  Constitutional: Negative for fever, chills, activity change, appetite  change, fatigue and unexpected weight change.  HENT: Negative for hearing loss and neck pain.   Eyes: Negative for visual disturbance.  Respiratory: Negative for cough, chest tightness, shortness of breath and wheezing.   Cardiovascular: Negative for chest pain, palpitations and leg swelling.  Gastrointestinal: Positive for abdominal pain (see HPI). Negative for nausea, vomiting, diarrhea, constipation, blood in stool and abdominal distention.  Genitourinary: Negative for hematuria and difficulty urinating.  Musculoskeletal: Negative for myalgias and arthralgias.  Skin: Negative for rash.  Neurological: Negative for dizziness, seizures, syncope and headaches.  Hematological: Does not bruise/bleed easily.  Psychiatric/Behavioral: Negative for dysphoric mood. The patient is not nervous/anxious.        Objective:   Physical Exam  Nursing note and vitals reviewed. Constitutional: He is oriented to person, place, and time. He appears well-developed and well-nourished. No distress.  HENT:  Head: Normocephalic and atraumatic.  Right Ear: External ear normal.  Left Ear: External ear normal.  Nose: Nose normal.  Mouth/Throat: Oropharynx is clear and moist. No oropharyngeal exudate.  Eyes: Conjunctivae normal and EOM are normal. Pupils are equal, round, and reactive to light. No scleral icterus.  Neck: Normal range of motion. Neck supple. Carotid bruit is not present.  Cardiovascular: Normal rate, regular rhythm, normal heart sounds and intact distal pulses.   No murmur heard. Pulses:      Radial pulses are 2+ on the right side, and 2+ on the left side.  Pulmonary/Chest: Effort normal and breath sounds normal. No respiratory distress. He has no wheezes. He has no rales.  Abdominal: Soft. Bowel sounds are normal. He exhibits no distension and no mass. There is no hepatosplenomegaly. There is no tenderness. There is no rebound and no guarding.       Obese. No RUQ pain  Genitourinary: Rectum  normal and prostate normal. Rectal exam shows no external hemorrhoid, no internal hemorrhoid, no fissure, no mass, no tenderness and anal tone normal. Prostate is not enlarged (20gm) and not tender.  Musculoskeletal: Normal range of motion. He exhibits no edema.       Diabetic foot exam: Normal inspection No skin breakdown No calluses  Normal DP/PT pulses Normal sensation to light touch and diminished monofilament R>L Nails normal  Lymphadenopathy:    He has no cervical adenopathy.  Neurological: He is alert and oriented to person, place, and time.       CN grossly intact, station and gait intact  Skin: Skin is warm and dry. No rash noted.  Psychiatric: He has a normal mood and affect. His behavior is normal. Judgment and thought content normal.  Assessment & Plan:

## 2012-10-11 NOTE — Patient Instructions (Addendum)
Try gasX or beano whenever eating gas producing foods.  If not better with this, let me know for abdominal ultrasound.  If at any point fever >101 or nausea with worsening pain, seek urgent care. Stop simcor.  Change to simvastatin 20mg  daily (alone). Return in 6 months for follow up and recheck cholesterol levels. Good to see you today, call us with questions. Stool kit today.

## 2012-10-11 NOTE — Assessment & Plan Note (Signed)
Not consistent with cholelithiasis, however does have known gallstones. Discussed red flags to seek urgent care. Anticipate more gas related pain - recommended trial of gasx and beano. If not improved with this - consider further imaging with [place] vs CT.

## 2012-10-17 ENCOUNTER — Other Ambulatory Visit: Payer: Medicare Other

## 2012-10-17 DIAGNOSIS — Z1211 Encounter for screening for malignant neoplasm of colon: Secondary | ICD-10-CM

## 2012-10-20 ENCOUNTER — Other Ambulatory Visit: Payer: Self-pay | Admitting: Family Medicine

## 2012-10-20 DIAGNOSIS — R195 Other fecal abnormalities: Secondary | ICD-10-CM

## 2012-10-22 ENCOUNTER — Encounter: Payer: Self-pay | Admitting: Gastroenterology

## 2012-11-08 ENCOUNTER — Other Ambulatory Visit: Payer: Self-pay | Admitting: Family Medicine

## 2012-11-09 HISTORY — PX: COLONOSCOPY: SHX174

## 2012-11-20 ENCOUNTER — Ambulatory Visit (AMBULATORY_SURGERY_CENTER): Payer: Medicare Other | Admitting: *Deleted

## 2012-11-20 VITALS — Ht 70.5 in | Wt 276.0 lb

## 2012-11-20 DIAGNOSIS — K921 Melena: Secondary | ICD-10-CM

## 2012-11-20 MED ORDER — NA SULFATE-K SULFATE-MG SULF 17.5-3.13-1.6 GM/177ML PO SOLN: ORAL | Status: DC

## 2012-12-04 ENCOUNTER — Encounter: Payer: Self-pay | Admitting: Gastroenterology

## 2012-12-04 ENCOUNTER — Other Ambulatory Visit: Payer: Self-pay | Admitting: Gastroenterology

## 2012-12-04 ENCOUNTER — Ambulatory Visit (AMBULATORY_SURGERY_CENTER): Payer: Medicare Other | Admitting: Gastroenterology

## 2012-12-04 VITALS — BP 112/68 | HR 68 | Temp 96.8°F | Resp 12 | Ht 70.5 in | Wt 276.0 lb

## 2012-12-04 DIAGNOSIS — Z1211 Encounter for screening for malignant neoplasm of colon: Secondary | ICD-10-CM | POA: Diagnosis not present

## 2012-12-04 DIAGNOSIS — K5730 Diverticulosis of large intestine without perforation or abscess without bleeding: Secondary | ICD-10-CM | POA: Diagnosis not present

## 2012-12-04 DIAGNOSIS — K921 Melena: Secondary | ICD-10-CM

## 2012-12-04 DIAGNOSIS — D126 Benign neoplasm of colon, unspecified: Secondary | ICD-10-CM

## 2012-12-04 LAB — GLUCOSE, CAPILLARY: Glucose-Capillary: 102 mg/dL — ABNORMAL HIGH (ref 70–99)

## 2012-12-04 MED ORDER — SODIUM CHLORIDE 0.9 % IV SOLN: 500.0000 mL | INTRAVENOUS | Status: DC

## 2012-12-04 NOTE — Patient Instructions (Addendum)

## 2012-12-04 NOTE — Op Note (Signed)
[facility]  [address]. [place], [address]   COLONOSCOPY PROCEDURE REPORT  PATIENT: [name] Young, [name] Young  MR#: [id number] BIRTHDATE: 1941-01-23 , 24  yrs. old GENDER: Male ENDOSCOPIST: Milus Banister, MD REFERRED [id number] Gutierrez, M.D. PROCEDURE DATE:  12/04/2012 PROCEDURE:   Colonoscopy with snare polypectomy ASA CLASS:   Class III INDICATIONS:average risk screening. MEDICATIONS: Fentanyl 75 mcg IV, Versed 9 mg IV, and These medications were titrated to patient response per physician's verbal order  DESCRIPTION OF PROCEDURE:   After the risks benefits and alternatives of the procedure were thoroughly explained, informed consent was obtained.  A digital rectal exam revealed no abnormalities of the rectum.   The LB PCF-H180AL [id number]  endoscope was introduced through the anus and advanced to the cecum, which was identified by both the appendix and ileocecal valve. No adverse events experienced.   The quality of the prep was good.  The instrument was then slowly withdrawn as the colon was fully examined.  COLON FINDINGS: Eleven polyps were found, removed and 10 of them were retrieved, sent to pathology (jar 1).  These were all sessile, ranged in size from 15mm to 72mm, located in cecum, ascending, transverse segment.  All were removed with cold snare except one of them (72mm polyp in ascending segment) which required snare/cautery. There were numerous diverticulum in left colon.  The examination was otherwise normal.  Retroflexed views revealed no abnormalities. The time to cecum=4 minutes 25 seconds.  Withdrawal time=14 minutes 29 seconds.  The scope was withdrawn and the procedure completed. COMPLICATIONS: There were no complications.  ENDOSCOPIC IMPRESSION: Eleven polyps were found, removed and 10 of them were retrieved, sent to pathology (jar 1). There were numerous diverticulum in left colon. The examination was otherwise  normal.  RECOMMENDATIONS: You will receive a letter within 1-2 weeks with the results of your biopsy as well as final recommendations.  Please call my office if you have not received a letter after 3 weeks.  You will likely need repeat colonoscopy in 1 year given high number of polyps found today.   eSigned:  Milus Banister, MD 12/04/2012 9:28 AM

## 2012-12-04 NOTE — Progress Notes (Signed)
Patient did not experience any of the following events: a burn prior to discharge; a fall within the facility; wrong site/side/patient/procedure/implant event; or a hospital transfer or hospital admission upon discharge from the facility. (G8907) Patient did not have preoperative order for IV antibiotic SSI prophylaxis. (G8918)  

## 2012-12-05 ENCOUNTER — Telehealth: Payer: Self-pay | Admitting: *Deleted

## 2012-12-05 DIAGNOSIS — E119 Type 2 diabetes mellitus without complications: Secondary | ICD-10-CM | POA: Diagnosis not present

## 2012-12-05 NOTE — Telephone Encounter (Signed)
No identifier, left message, follow-up  

## 2012-12-09 ENCOUNTER — Encounter: Payer: Self-pay | Admitting: Family Medicine

## 2012-12-10 ENCOUNTER — Encounter: Payer: Self-pay | Admitting: Gastroenterology

## 2012-12-19 ENCOUNTER — Ambulatory Visit: Payer: Medicare Other | Admitting: Family Medicine

## 2012-12-19 ENCOUNTER — Ambulatory Visit (INDEPENDENT_AMBULATORY_CARE_PROVIDER_SITE_OTHER): Payer: Medicare Other | Admitting: Family Medicine

## 2012-12-19 ENCOUNTER — Encounter: Payer: Self-pay | Admitting: Family Medicine

## 2012-12-19 VITALS — BP 122/70 | HR 68 | Temp 97.7°F | Ht 71.0 in | Wt 276.0 lb

## 2012-12-19 DIAGNOSIS — L989 Disorder of the skin and subcutaneous tissue, unspecified: Secondary | ICD-10-CM | POA: Diagnosis not present

## 2012-12-19 DIAGNOSIS — E785 Hyperlipidemia, unspecified: Secondary | ICD-10-CM

## 2012-12-19 MED ORDER — CEPHALEXIN 500 MG PO CAPS: 500.0000 mg | ORAL_CAPSULE | Freq: Three times a day (TID) | ORAL | Status: DC

## 2012-12-19 NOTE — Assessment & Plan Note (Signed)
Recent regimen change - I asked him to return in 1 mo fasting for FLP. Prior on trilipix, simcor (1000/20) and fish oil 3gm/day

## 2012-12-19 NOTE — Progress Notes (Signed)
  Subjective:    Patient ID: [name] Young., male    DOB: [birth year], 72 y.o.   MRN: [id number]  HPI CC: lumps on body  4d ago noticed lump under R arm (axilla).  Seems to be getting smaller.  Denies recent dog bites or cat scratches.  Denies skin infections.  Also 3 wks ago when working outside (ice storm), branch he was breaking whipped back and hit his R medial lower leg.  Started as goose egg, has been improving.  Treating with ice packs as well as warm compresses.  Remaining minimally tender, occasional sharp pains.  Never broke skin.  Past Medical History  Diagnosis Date  . T2DM (type 2 diabetes mellitus) 2010  . GERD (gastroesophageal reflux disease)   . HTN (hypertension)   . HLD (hyperlipidemia)   . History of kidney stones     [name] @ [facility]) now new doctor  . History of cholelithiasis   . History of pneumonia      Review of Systems Per HPI    Objective:   Physical Exam  Nursing note and vitals reviewed. Constitutional: He appears well-developed and well-nourished. No distress.  Musculoskeletal:       Legs: R axilla with small superficial nodule overlying abrasion. R lower leg with hardened hematoma and inferior to this tender erythematous patch of skin.  Bruising noted around erythema  Lymphadenopathy:    He has no axillary adenopathy.       Right axillary: No lateral adenopathy present.       Left axillary: No lateral adenopathy present.      Assessment & Plan:

## 2012-12-19 NOTE — Patient Instructions (Signed)
I think arm lesion is scarred nodule - should resolve with time.  If enlarging let me know. Looks like you have resolving hematoma on right leg, but I am a bit worried about the redness below.  Treat as possible skin infection with course of antibiotic sent to pharmacy. Again, let me know if not improving as expected. Warm compresses to leg and elevate leg as much as able.

## 2012-12-19 NOTE — Assessment & Plan Note (Signed)
Axillary lesion - likely inflammatory nodule, too superficial for LAD.  Monitor should improve with time.   Leg lesion - initial hematoma after trauma, resolving.  concern for developing cellulitis inferior to hematoma.  Treat with keflex 500mg  tid x 7 days, update if sxs persist or worsen. Encouraged warm compresses and elevation

## 2013-01-14 ENCOUNTER — Other Ambulatory Visit (INDEPENDENT_AMBULATORY_CARE_PROVIDER_SITE_OTHER): Payer: Medicare Other

## 2013-01-14 DIAGNOSIS — E785 Hyperlipidemia, unspecified: Secondary | ICD-10-CM | POA: Diagnosis not present

## 2013-01-14 LAB — LIPID PANEL
HDL: 31 mg/dL — ABNORMAL LOW (ref >39.00)
Total CHOL/HDL Ratio: 4
Triglycerides: 217 mg/dL — ABNORMAL HIGH (ref 0.0–149.0)

## 2013-01-14 LAB — BASIC METABOLIC PANEL
Calcium: 9.9 mg/dL (ref 8.4–10.5)
GFR: 55.92 mL/min — ABNORMAL LOW (ref >60.00)
Potassium: 4.5 mEq/L (ref 3.5–5.1)
Sodium: 141 mEq/L (ref 135–145)

## 2013-01-16 ENCOUNTER — Ambulatory Visit (INDEPENDENT_AMBULATORY_CARE_PROVIDER_SITE_OTHER): Payer: Medicare Other | Admitting: Family Medicine

## 2013-01-16 ENCOUNTER — Encounter: Payer: Self-pay | Admitting: Family Medicine

## 2013-01-16 ENCOUNTER — Encounter: Payer: Self-pay | Admitting: *Deleted

## 2013-01-16 VITALS — BP 128/82 | HR 88 | Temp 98.1°F | Wt 275.2 lb

## 2013-01-16 DIAGNOSIS — B354 Tinea corporis: Secondary | ICD-10-CM | POA: Insufficient documentation

## 2013-01-16 LAB — POCT SKIN KOH: Skin KOH, POC: POSITIVE

## 2013-01-16 MED ORDER — CLOTRIMAZOLE 1 % EX CREA: TOPICAL_CREAM | Freq: Two times a day (BID) | CUTANEOUS | Status: DC

## 2013-01-16 NOTE — Assessment & Plan Note (Signed)
KOH positive. Treat with lotrimin. Pt agrees with plan.

## 2013-01-16 NOTE — Patient Instructions (Signed)
Ringworm - use clotrimazole or lotrimin OTC cream - apply to area twice daily for 3-4 weeks.  Ringworm, Body [Tinea Corporis] Ringworm is a fungal infection of the skin and hair. Another name for this problem is Tinea Corporis. It has nothing to do with worms. A fungus is an organism that lives on dead cells (the outer layer of skin). It can involve the entire body. It can spread from infected pets. Tinea corporis can be a problem in wrestlers who may get the infection form other players/opponents, equipment and mats. DIAGNOSIS  A skin scraping can be obtained from the affected area and by looking for fungus under the microscope. This is called a KOH examination.  HOME CARE INSTRUCTIONS   Ringworm may be treated with a topical antifungal cream, ointment, or oral medications.  If you are using a cream or ointment, wash infected skin. Dry it completely before application.  Scrub the skin with a buff puff or abrasive sponge using a shampoo with ketoconazole to remove dead skin and help treat the ringworm.  Have your pet treated by your veterinarian if it has the same infection. SEEK MEDICAL CARE IF:   Your ringworm patch (fungus) continues to spread after 7 days of treatment.  Your rash is not gone in 4 weeks. Fungal infections are slow to respond to treatment. Some redness (erythema) may remain for several weeks after the fungus is gone.  The area becomes red, warm, tender, and swollen beyond the patch. This may be a secondary bacterial (germ) infection.  You have a fever. Document Released: 08/25/2000 Document Revised: 11/20/2011 Document Reviewed: 02/05/2009 [facility] Patient Information 2013 [place].

## 2013-01-16 NOTE — Progress Notes (Signed)
  Subjective:    Patient ID: [name]., male    DOB: 1941-06-12, 72 y.o.   MRN: [id number]  HPI CC: check spot under arm  Seen here 12/19/2012 with axillary lesion, thought inflammatory nodule, too superficial for LAD.  Present for the last month. Actually has spread.  Not itchy or tender.  Right leg lesion significantly improved, no further erythema.  Continued bump anterior shin, smaller in size.  Treated with keflex course.  Past Medical History  Diagnosis Date  . T2DM (type 2 diabetes mellitus) 2010  . GERD (gastroesophageal reflux disease)   . HTN (hypertension)   . HLD (hyperlipidemia)   . History of kidney stones     [name] @ [facility]) now new doctor  . History of cholelithiasis   . History of pneumonia     Review of Systems Per HPI    Objective:   Physical Exam Obese CM, NAD. Right upper axilla with one round erythematous patch on skin with scaly borders, larger patche inferior to this, some scaling. No other skin lesions      Assessment & Plan:  KOH collected - positive for hyphae.

## 2013-01-16 NOTE — Addendum Note (Signed)
Addended by: Ria Bush on: 01/16/2013 10:40 AM   Modules accepted: Orders

## 2013-03-28 ENCOUNTER — Other Ambulatory Visit: Payer: Self-pay | Admitting: Family Medicine

## 2013-05-26 DIAGNOSIS — Z23 Encounter for immunization: Secondary | ICD-10-CM | POA: Diagnosis not present

## 2013-05-29 DIAGNOSIS — H353190 Nonexudative age-related macular degeneration, unspecified eye, stage unspecified: Secondary | ICD-10-CM | POA: Diagnosis not present

## 2013-05-29 DIAGNOSIS — E119 Type 2 diabetes mellitus without complications: Secondary | ICD-10-CM | POA: Diagnosis not present

## 2013-06-10 ENCOUNTER — Other Ambulatory Visit: Payer: Self-pay | Admitting: Family Medicine

## 2013-06-24 ENCOUNTER — Emergency Department (HOSPITAL_COMMUNITY)
Admission: EM | Admit: 2013-06-24 | Discharge: 2013-06-24 | Disposition: A | Discharge status: Home / Self Care | Payer: Medicare Other | Attending: Emergency Medicine | Admitting: Emergency Medicine

## 2013-06-24 ENCOUNTER — Encounter (HOSPITAL_COMMUNITY): Payer: Self-pay | Admitting: Emergency Medicine

## 2013-06-24 DIAGNOSIS — Z8701 Personal history of pneumonia (recurrent): Secondary | ICD-10-CM | POA: Diagnosis not present

## 2013-06-24 DIAGNOSIS — R109 Unspecified abdominal pain: Secondary | ICD-10-CM

## 2013-06-24 DIAGNOSIS — K219 Gastro-esophageal reflux disease without esophagitis: Secondary | ICD-10-CM | POA: Insufficient documentation

## 2013-06-24 DIAGNOSIS — E785 Hyperlipidemia, unspecified: Secondary | ICD-10-CM | POA: Diagnosis not present

## 2013-06-24 DIAGNOSIS — Z7982 Long term (current) use of aspirin: Secondary | ICD-10-CM | POA: Diagnosis not present

## 2013-06-24 DIAGNOSIS — N179 Acute kidney failure, unspecified: Secondary | ICD-10-CM | POA: Diagnosis not present

## 2013-06-24 DIAGNOSIS — Z87442 Personal history of urinary calculi: Secondary | ICD-10-CM | POA: Diagnosis not present

## 2013-06-24 DIAGNOSIS — E119 Type 2 diabetes mellitus without complications: Secondary | ICD-10-CM | POA: Diagnosis not present

## 2013-06-24 DIAGNOSIS — Z9889 Other specified postprocedural states: Secondary | ICD-10-CM | POA: Diagnosis not present

## 2013-06-24 DIAGNOSIS — Z79899 Other long term (current) drug therapy: Secondary | ICD-10-CM | POA: Insufficient documentation

## 2013-06-24 DIAGNOSIS — I10 Essential (primary) hypertension: Secondary | ICD-10-CM | POA: Insufficient documentation

## 2013-06-24 LAB — URINALYSIS, ROUTINE W REFLEX MICROSCOPIC
Bilirubin Urine: NEGATIVE
Glucose, UA: NEGATIVE mg/dL
Hgb urine dipstick: NEGATIVE
Ketones, ur: NEGATIVE mg/dL
Leukocytes, UA: NEGATIVE
Nitrite: NEGATIVE
Protein, ur: NEGATIVE mg/dL
Specific Gravity, Urine: 1.027 (ref 1.005–1.030)
Urobilinogen, UA: 1 mg/dL (ref 0.0–1.0)
pH: 5.5 (ref 5.0–8.0)

## 2013-06-24 LAB — POCT I-STAT, CHEM 8
BUN: 22 mg/dL (ref 6–23)
Calcium, Ion: 1.37 mmol/L — ABNORMAL HIGH (ref 1.13–1.30)
Chloride: 107 mEq/L (ref 96–112)
Creatinine, Ser: 1.9 mg/dL — ABNORMAL HIGH (ref 0.50–1.35)
Glucose, Bld: 135 mg/dL — ABNORMAL HIGH (ref 70–99)
HCT: 44 % (ref 39.0–52.0)
Hemoglobin: 15 g/dL (ref 13.0–17.0)
Potassium: 3.8 mEq/L (ref 3.5–5.1)
Sodium: 143 mEq/L (ref 135–145)
TCO2: 22 mmol/L (ref 0–100)

## 2013-06-24 MED ORDER — SODIUM CHLORIDE 0.9 % IV BOLUS (SEPSIS)
1000.0000 mL | Freq: Once | INTRAVENOUS | Status: AC
  Administered 2013-06-24: 1000 mL via INTRAVENOUS

## 2013-06-24 MED ORDER — PROMETHAZINE HCL 25 MG/ML IJ SOLN
12.5000 mg | Freq: Once | INTRAMUSCULAR | Status: AC
  Administered 2013-06-24: 12.5 mg via INTRAVENOUS
  Filled 2013-06-24: qty 1

## 2013-06-24 MED ORDER — HYDROMORPHONE HCL PF 1 MG/ML IJ SOLN
1.0000 mg | Freq: Once | INTRAMUSCULAR | Status: AC
  Administered 2013-06-24: 1 mg via INTRAVENOUS
  Filled 2013-06-24: qty 1

## 2013-06-24 MED ORDER — KETOROLAC TROMETHAMINE 15 MG/ML IJ SOLN
15.0000 mg | Freq: Once | INTRAMUSCULAR | Status: AC
  Administered 2013-06-24: 15 mg via INTRAVENOUS
  Filled 2013-06-24 (×2): qty 1

## 2013-06-24 MED ORDER — ONDANSETRON HCL 4 MG PO TABS: 4.0000 mg | ORAL_TABLET | Freq: Four times a day (QID) | ORAL | Status: DC

## 2013-06-24 MED ORDER — ONDANSETRON HCL 4 MG/2ML IJ SOLN
4.0000 mg | Freq: Once | INTRAMUSCULAR | Status: AC
  Administered 2013-06-24: 4 mg via INTRAVENOUS
  Filled 2013-06-24: qty 2

## 2013-06-24 NOTE — ED Notes (Signed)
Pt complains of right sided flank pain since this am, hx of kidney stone and the pain is the same, he took tramadol and dilaudid at home at 4pm, no relief and vomited on the way to ED

## 2013-06-24 NOTE — ED Provider Notes (Signed)
CSN: [id number]     Arrival date & time 06/24/13  1953 History   First MD Initiated Contact with Patient 06/24/13 2023     Chief Complaint  Patient presents with  . Flank Pain   (Consider location/radiation/quality/duration/timing/severity/associated sxs/prior Treatment) HPI  72 year old male with right flank pain. Onset this morning. Pain does not radiate. Pain waxes and wanes does not completely go away. Reports a past history of renal stones states that the current symptoms feel very similar. Nausea and has vomited and the pain is in severe. No urinary complaints. No fevers or chills. Took dilaudid prior to arrival with only minimal relief. No CP. No SOB.   Past Medical History  Diagnosis Date  . T2DM (type 2 diabetes mellitus) 2010  . GERD (gastroesophageal reflux disease)   . HTN (hypertension)   . HLD (hyperlipidemia)   . History of kidney stones     [name] @ [facility]) now new doctor  . History of cholelithiasis   . History of pneumonia    Past Surgical History  Procedure Laterality Date  . Umbilical hernia repair      with mesh  . Knee cartilage surgery      left  . Carpal tunnel release      bilateral  . Cataract extraction, bilateral    . Colonoscopy  11/2012    11 adenomatous polyps, diverticulosis, rec rpt 1 yr Ardis Hughs)   Family History  Problem Relation Age of Onset  . Alzheimer's disease Mother   . Cancer Mother     breast  . Stroke Father 60  . Diabetes Father   . Cancer Maternal Grandfather     rectal  . Colon cancer Maternal Grandfather 51  . CAD Neg Hx    History  Substance Use Topics  . Smoking status: Never Smoker   . Smokeless tobacco: Former Systems developer    Types: [place] date: 09/08/2001  . Alcohol Use: No    Review of Systems  All systems reviewed and negative, other than as noted in HPI.   Allergies  Review of patient's allergies indicates no known allergies.  Home Medications   Current Outpatient Rx  Name  Route  Sig   Dispense  Refill  . aspirin 325 MG tablet   Oral   Take 325 mg by mouth daily.         . Cholecalciferol (VITAMIN D) 2000 UNITS CAPS   Oral   Take 1 capsule by mouth daily.         . Choline Fenofibrate (FENOFIBRIC ACID) 135 MG CPDR      TAKE ONE CAPSULE BY MOUTH DAILY   30 capsule   3   . HYDROmorphone (DILAUDID) 4 MG tablet   Oral   Take 4 mg by mouth every 4 (four) hours as needed for pain.         Marland Kitchen lisinopril (PRINIVIL,ZESTRIL) 20 MG tablet      TAKE ONE (1) TABLET BY MOUTH EVERY DAY   30 tablet   11   . metFORMIN (GLUCOPHAGE) 1000 MG tablet      TAKE ONE (1) TABLET BY MOUTH TWO (2) TIMES DAILY WITH A MEAL   60 tablet   6   . Misc Natural Products (OSTEO BI-FLEX JOINT SHIELD PO)   Oral   Take 2 tablets by mouth daily.         . Multiple Vitamins-Minerals (MACULAR VITAMIN BENEFIT PO)   Oral   Take 1 tablet by  mouth daily.         . Omega-3 Fatty Acids (FISH OIL) 1200 MG CAPS   Oral   Take 1 capsule by mouth 3 (three) times daily.         . simvastatin (ZOCOR) 20 MG tablet   Oral   Take 1 tablet (20 mg total) by mouth at bedtime.   90 tablet   3   . traMADol (ULTRAM) 50 MG tablet   Oral   Take 50 mg by mouth every 6 (six) hours as needed for pain.         Marland Kitchen ondansetron (ZOFRAN) 4 MG tablet   Oral   Take 1 tablet (4 mg total) by mouth every 6 (six) hours.   12 tablet   0    BP 182/80  Pulse 76  Temp(Src) 98 F (36.7 C) (Oral)  Resp 18  Ht 5\' 11"  (1.803 m)  Wt 278 lb (126.1 kg)  BMI 38.79 kg/m2  SpO2 97% Physical Exam  Nursing note and vitals reviewed. Constitutional: He appears well-developed and well-nourished.  HENT:  Head: Normocephalic and atraumatic.  Eyes: Conjunctivae are normal. Right eye exhibits no discharge. Left eye exhibits no discharge.  Neck: Neck supple.  Cardiovascular: Normal rate, regular rhythm and normal heart sounds.  Exam reveals no gallop and no friction rub.   No murmur heard. Pulmonary/Chest:  Effort normal and breath sounds normal. No respiratory distress.  Abdominal: Soft. He exhibits no distension. There is no tenderness.  Genitourinary:  R CVA tenderness  Musculoskeletal: He exhibits no edema and no tenderness.  Neurological: He is alert.  Skin: Skin is warm and dry.  Psychiatric: He has a normal mood and affect. His behavior is normal. Thought content normal.    ED Course  Procedures (including critical care time) Labs Review Labs Reviewed  URINALYSIS, ROUTINE W REFLEX MICROSCOPIC - Abnormal; Notable for the following:    APPearance CLOUDY (*)    All other components within normal limits  POCT I-STAT, CHEM 8 - Abnormal; Notable for the following:    Creatinine, Ser 1.90 (*)    Glucose, Bld 135 (*)    Calcium, Ion 1.37 (*)    All other components within normal limits   Imaging Review No results found.  EKG Interpretation   None       MDM   1. Right flank pain   2. AKI (acute kidney injury)    72 year old male with right flank pain. Treated presumptively for renal colic based on his history and reports that current symptoms similar to previous episodes.  Urinalysis not consistent with infection. Interestingly, no blood noted. Small percentage of ureteral stones will not have blood on UA though. Some mild renal insufficiency was noted. This appears to be new. Patient was treated with IV fluids. Symptoms currently controlled. Discussed the need to followup with his PCP for repeat blood work. Urology followup as needed with regards to likely renal colic. Return precautions were discussed. No urinary follow and he was in    Virgel Manifold, MD 07/01/13 1419

## 2013-06-24 NOTE — ED Notes (Signed)
Pt states has a hx of kidney stones, started having R sided flank pain this morning, states he has the symptoms of having a kidney stones, pain 9/10.

## 2013-06-25 ENCOUNTER — Telehealth: Payer: Self-pay | Admitting: Family Medicine

## 2013-06-25 DIAGNOSIS — N179 Acute kidney failure, unspecified: Secondary | ICD-10-CM

## 2013-06-25 DIAGNOSIS — E785 Hyperlipidemia, unspecified: Secondary | ICD-10-CM

## 2013-06-25 DIAGNOSIS — E1140 Type 2 diabetes mellitus with diabetic neuropathy, unspecified: Secondary | ICD-10-CM

## 2013-06-25 DIAGNOSIS — I10 Essential (primary) hypertension: Secondary | ICD-10-CM

## 2013-06-25 NOTE — Telephone Encounter (Signed)
I see pt was evaluated at ER with flank pain and kidney insufficiency - I'd like him to come in in 1-2 wks to recheck kidneys, sooner if flank pain not improving.

## 2013-06-27 NOTE — Telephone Encounter (Signed)
OV or just lab?

## 2013-06-28 NOTE — Telephone Encounter (Signed)
labwork and then office visit. Orders in chart.

## 2013-07-01 NOTE — Telephone Encounter (Signed)
Message left for patient to call and schedule lab and OV.

## 2013-07-02 NOTE — Telephone Encounter (Signed)
Appts scheduled:

## 2013-07-03 ENCOUNTER — Other Ambulatory Visit (INDEPENDENT_AMBULATORY_CARE_PROVIDER_SITE_OTHER): Payer: Medicare Other

## 2013-07-03 DIAGNOSIS — N179 Acute kidney failure, unspecified: Secondary | ICD-10-CM

## 2013-07-03 LAB — CBC WITH DIFFERENTIAL/PLATELET
Basophils Relative: 0.4 % (ref 0.0–3.0)
Eosinophils Relative: 2.1 % (ref 0.0–5.0)
HCT: 37.8 % — ABNORMAL LOW (ref 39.0–52.0)
Hemoglobin: 12.6 g/dL — ABNORMAL LOW (ref 13.0–17.0)
Lymphocytes Relative: 31.9 % (ref 12.0–46.0)
Lymphs Abs: 2.4 10*3/uL (ref 0.7–4.0)
Monocytes Relative: 7 % (ref 3.0–12.0)
Neutro Abs: 4.5 10*3/uL (ref 1.4–7.7)
RDW: 15 % — ABNORMAL HIGH (ref 11.5–14.6)
WBC: 7.6 10*3/uL (ref 4.5–10.5)

## 2013-07-03 LAB — RENAL FUNCTION PANEL
BUN: 22 mg/dL (ref 6–23)
CO2: 27 mEq/L (ref 19–32)
Chloride: 106 mEq/L (ref 96–112)
Creatinine, Ser: 1.3 mg/dL (ref 0.4–1.5)
GFR: 57.83 mL/min — ABNORMAL LOW (ref >60.00)
Phosphorus: 2.8 mg/dL (ref 2.3–4.6)

## 2013-07-08 ENCOUNTER — Ambulatory Visit (INDEPENDENT_AMBULATORY_CARE_PROVIDER_SITE_OTHER): Payer: Medicare Other | Admitting: Family Medicine

## 2013-07-08 ENCOUNTER — Encounter: Payer: Self-pay | Admitting: Family Medicine

## 2013-07-08 VITALS — BP 148/82 | HR 80 | Temp 98.3°F | Wt 276.2 lb

## 2013-07-08 DIAGNOSIS — E1129 Type 2 diabetes mellitus with other diabetic kidney complication: Secondary | ICD-10-CM

## 2013-07-08 DIAGNOSIS — N23 Unspecified renal colic: Secondary | ICD-10-CM | POA: Diagnosis not present

## 2013-07-08 DIAGNOSIS — I10 Essential (primary) hypertension: Secondary | ICD-10-CM

## 2013-07-08 DIAGNOSIS — E1122 Type 2 diabetes mellitus with diabetic chronic kidney disease: Secondary | ICD-10-CM | POA: Insufficient documentation

## 2013-07-08 DIAGNOSIS — E1121 Type 2 diabetes mellitus with diabetic nephropathy: Secondary | ICD-10-CM

## 2013-07-08 DIAGNOSIS — N183 Chronic kidney disease, stage 3 unspecified: Secondary | ICD-10-CM

## 2013-07-08 DIAGNOSIS — N058 Unspecified nephritic syndrome with other morphologic changes: Secondary | ICD-10-CM

## 2013-07-08 NOTE — Assessment & Plan Note (Signed)
Chronic. Elevated today.  Attributed to dietary indiscretions (pork).  No changes today.

## 2013-07-08 NOTE — Assessment & Plan Note (Signed)
Associated with AKI - with Cr up to 1.9.  On recheck Cr back to 1.3, but persistently slightly elevated.   Discussed possible CKD stage and importance of hydration status and avoiding NSAIDs. Seems resolved.

## 2013-07-08 NOTE — Patient Instructions (Signed)
Return in 3 months for fasting blood work and afterwards for [employer] visit. For kidneys - stay hydrated.  Avoid too much ibuprofen, aleve, advil. Good to see you today, call us with questions.

## 2013-07-08 NOTE — Assessment & Plan Note (Signed)
New diagnosis - reviewed with patient avoiding NSAIDs and importance of good hydration status. New BP goal <130/80.  Reassess next visit. Recheck CBC next visit as well.

## 2013-07-08 NOTE — Progress Notes (Signed)
  Subjective:    Patient ID: [name] Young., male    DOB: 1941/07/24, 72 y.o.   MRN: [id number]  HPI CC: ER f/u  Seen at ER - with severe R flank pain in known h/o kidney stones but no hematuria.  Dx with renal colic (123XX123).  H/o same. Next morning passed 2-3 drops of blood but no kidney stones. Uses dilaudid and tramadol PO for pain.  Last saw urologist spring 2014 [facility]?).  HTN - bp elevated - has been eating more pork.  Not compliant with diet. DM - compliant with metformin.  HLD - compliant with 3 meds (fish oil, fenofibric acid, and simvastatin). Lab Results  Component Value Date   HGBA1C 6.1 10/04/2012    Wt Readings from Last 3 Encounters:  07/08/13 276 lb 4 oz (125.306 kg)  06/24/13 278 lb (126.1 kg)  01/16/13 275 lb 4 oz (124.853 kg)    Past Medical History  Diagnosis Date  . T2DM (type 2 diabetes mellitus) 2010  . GERD (gastroesophageal reflux disease)   . HTN (hypertension)   . HLD (hyperlipidemia)   . History of kidney stones     [name] @ [facility]) now new doctor  . History of cholelithiasis   . History of pneumonia      Review of Systems Per HPI    Objective:   Physical Exam  Nursing note and vitals reviewed. Constitutional: He appears well-developed and well-nourished. No distress.  Obese Body mass index is 38.55 kg/(m^2).        Assessment & Plan:

## 2013-09-22 ENCOUNTER — Other Ambulatory Visit: Payer: Self-pay | Admitting: Urology

## 2013-09-22 DIAGNOSIS — N200 Calculus of kidney: Secondary | ICD-10-CM | POA: Diagnosis not present

## 2013-09-23 ENCOUNTER — Encounter (HOSPITAL_COMMUNITY): Payer: Self-pay | Admitting: *Deleted

## 2013-09-23 NOTE — Progress Notes (Signed)
Spoke to patient via phone,history obtained,updated.  Bring blue folder,insurance cards,picture ID,designated driver and living will,POA, if desires (to be placed on chart). Reinforced no aspirin(instructions to hold aspirin per your doctor), ibuprofen products 72 hours prior to procedure. No vitamins or herbal medicines 7 days prior to procedure.   Follow laxative instructions provided by urologist (office) and in blue folder. Wear easy on/off clothing and no jewelry except wedding rings and ear rings. Leave all other valuables at home. Verbalizes understanding of instructions  No alcohol 24 hours prior to procedure.call md office if you have a cold,sorethroat or fever. Shower or bathe before your treatment. You may have clear liquids up to 4 hours prior to treatment,NO SOLIDS 6 hours prior to treatment. If you wear a pain patch,please remove it prior to treatment and do not reapply for 24 hours.  If you are breastfeeding, it is highly recommended that you pump and discard your breast milk for 24 hours after your tteatmentPlease bring your C_PAP/Breathing machine if you use one.

## 2013-09-24 ENCOUNTER — Encounter (HOSPITAL_COMMUNITY): Payer: Self-pay | Admitting: Pharmacy Technician

## 2013-09-25 ENCOUNTER — Encounter (HOSPITAL_COMMUNITY)
Admission: RE | Disposition: A | Discharge status: Home / Self Care | Payer: Self-pay | Source: Ambulatory Visit | Attending: Urology

## 2013-09-25 ENCOUNTER — Encounter (HOSPITAL_COMMUNITY): Payer: Self-pay | Admitting: General Practice

## 2013-09-25 ENCOUNTER — Ambulatory Visit (HOSPITAL_COMMUNITY): Payer: Medicare Other

## 2013-09-25 ENCOUNTER — Ambulatory Visit (HOSPITAL_COMMUNITY)
Admission: RE | Admit: 2013-09-25 | Discharge: 2013-09-25 | Disposition: A | Discharge status: Home / Self Care | Payer: Medicare Other | Source: Ambulatory Visit | Attending: Urology | Admitting: Urology

## 2013-09-25 DIAGNOSIS — Z79899 Other long term (current) drug therapy: Secondary | ICD-10-CM | POA: Insufficient documentation

## 2013-09-25 DIAGNOSIS — N201 Calculus of ureter: Secondary | ICD-10-CM | POA: Insufficient documentation

## 2013-09-25 DIAGNOSIS — N200 Calculus of kidney: Secondary | ICD-10-CM | POA: Insufficient documentation

## 2013-09-25 DIAGNOSIS — Z8701 Personal history of pneumonia (recurrent): Secondary | ICD-10-CM | POA: Insufficient documentation

## 2013-09-25 DIAGNOSIS — E119 Type 2 diabetes mellitus without complications: Secondary | ICD-10-CM | POA: Diagnosis not present

## 2013-09-25 DIAGNOSIS — I10 Essential (primary) hypertension: Secondary | ICD-10-CM | POA: Diagnosis not present

## 2013-09-25 DIAGNOSIS — Z7982 Long term (current) use of aspirin: Secondary | ICD-10-CM | POA: Diagnosis not present

## 2013-09-25 LAB — GLUCOSE, CAPILLARY: Glucose-Capillary: 89 mg/dL (ref 70–99)

## 2013-09-25 SURGERY — LITHOTRIPSY, ESWL
Anesthesia: LOCAL

## 2013-09-25 MED ORDER — SODIUM CHLORIDE 0.9 % IV SOLN
INTRAVENOUS | Status: DC
  Administered 2013-09-25: 14:00:00 via INTRAVENOUS

## 2013-09-25 MED ORDER — TAMSULOSIN HCL 0.4 MG PO CAPS: 0.4000 mg | ORAL_CAPSULE | ORAL | Status: DC

## 2013-09-25 MED ORDER — HYDROMORPHONE HCL 4 MG PO TABS: 4.0000 mg | ORAL_TABLET | ORAL | Status: DC | PRN

## 2013-09-25 MED ORDER — DIAZEPAM 5 MG PO TABS
10.0000 mg | ORAL_TABLET | ORAL | Status: AC
  Administered 2013-09-25: 10 mg via ORAL
  Filled 2013-09-25: qty 2

## 2013-09-25 MED ORDER — DIPHENHYDRAMINE HCL 25 MG PO CAPS
25.0000 mg | ORAL_CAPSULE | ORAL | Status: AC
Start: 2013-09-25 — End: 2013-09-25
  Administered 2013-09-25: 25 mg via ORAL
  Filled 2013-09-25: qty 1

## 2013-09-25 MED ORDER — CIPROFLOXACIN HCL 500 MG PO TABS
500.0000 mg | ORAL_TABLET | ORAL | Status: AC
  Administered 2013-09-25: 500 mg via ORAL
  Filled 2013-09-25: qty 1

## 2013-09-25 NOTE — Progress Notes (Signed)
[facility]t[facility] states Dr.Ottelin would like to start earlier on [name] Young if possible, new start time of 1500.  Called and spoke to [name] Young and informed him of request by doctor to start earlier.  Pt states that is no problem and he could do that. Informed pt to arrive at Short Stay at 1300, pt voiced understanding and states he will be there.

## 2013-09-25 NOTE — H&P (Signed)
Mr. Eichel is a 73 yr old male patient of Dr.Nassim Cosma's w/ GU hx of nephrolithiasis:     - He was managed for a number of years by Dr. [name]. He had a stone pass from the right side in 9/03. At that time a CT scan revealed right renal calculi but no left renal stones were noted. In 1/06 he required lithotripsy for right renal stone and in 5/11 a CT scan revealed 2, nonobstructing right renal calculi (6 mm and 4 mm) and no left renal stones were noted.  Stone analysis: 100% calcium oxalate    - He returned in Dec 2013 for left flank pain f/u after having passed a stone. He had not been seen since 2010-02-28 and has passed 3 stones since then. f/u KUB did not show any stones along the courses of the ureters and no left renal stone but possible right renal stone lower pole. No further interventions.    Interval hx:  Mr. Mcentire returns today w/ c/o worsening right flank pain that started 3 days ago and improves w/ hydromorphone 4mg . Pain is localized, constant, sharp at 8/10 in severity when it was first noted, and associated w/ nausea. He was carrying and playing w/ his grandson when pain started. Currently pain is 5/10 after taking hydromorphone several hours ago. Tramadol caused nausea but he denies any side effects on hydromorphone. He notes that similar right flank pain occurred in October 2014 but resolved after taking hydromorphone but returned a month later and again resolved after taking pain med. However, the current pain is more intense and persistence. Therefore he presents her today to "have something done" w/ this pain. He denies fever/chills. Denies hematuria. Denies any other associated signs/symptoms or modifying factors.  He had not had any stone episodes since he was last seen here in Dec 2013.      Surgical History Problems  1. History of Cataract Surgery 2. History of Lithotripsy 3. History of Umbilical Hernia Repair  Current Meds 1. Aspirin 325 MG Oral Tablet;   Therapy: (Recorded:18Nov2013) to Recorded 2. Fish Oil 1000 MG Oral Capsule;  Therapy: (Recorded:18Nov2013) to Recorded 3. HYDROmorphone HCl - 4 MG Oral Tablet;  Therapy: (Recorded:12Jan2015) to Recorded 4. Lisinopril 20 MG Oral Tablet;  Therapy: (Recorded:18Nov2013) to Recorded 5. Macular Vitamin Benefit Oral Tablet;  Therapy: (Recorded:12Jan2015) to Recorded 6. MetFORMIN HCl - 1000 MG Oral Tablet;  Therapy: (Recorded:18Nov2013) to Recorded 7. Osteo-Bi-Flex TABS;  Therapy: (Recorded:18Nov2013) to Recorded 8. Simvastatin 20 MG Oral Tablet;  Therapy: (Recorded:18Nov2013) to Recorded 9. Trilipix 135 MG Oral Capsule Delayed Release;  Therapy: (Recorded:18Nov2013) to Recorded 10. Vitamin D3 1000 UNIT Oral Capsule;   Therapy: (Recorded:18Nov2013) to Recorded  Allergies Medication  1. Tylox CAPS  Family History Problems  1. Family history of Family Health Status Number Of Children   1 son; 1 daughter 2. Family history of Alzheimer's disease (V17.2) : Mother 3. Family history of cerebrovascular accident (V17.1) : Father  Social History Problems  1. Denied: History of Alcohol Use 2. Caffeine Use   2 a day 3. Marital History - Currently Married 4. Never A Smoker 5. Occupation:   retired 90. Denied: History of Tobacco Use  Review of Systems Genitourinary system(s) were reviewed and pertinent findings if present are noted.  Genitourinary: no urinary frequency, no feelings of urinary urgency, no dysuria, no nocturia, urine stream is not weak, urinary stream does not start and stop, no incomplete emptying of bladder, no post-void dribbling, no hematuria and no suprapubic  pain.  Gastrointestinal: nausea and flank pain, but no vomiting, no abdominal pain, no diarrhea and no constipation.  Constitutional: no fever.    Vitals Vital Signs Height: 5 ft 10 in Weight: 258 lb  BMI Calculated: 37.02 BSA Calculated: 2.33 Blood Pressure: 157 / 87 Heart Rate: 88  Physical  Exam Constitutional: Well nourished and well developed . No acute distress.  ENT:. The ears and nose are normal in appearance.  Neck: The appearance of the neck is normal.  Pulmonary: No respiratory distress.  Cardiovascular:. The arterial pulses are normal.  Abdomen: The abdomen is soft and nontender. No suprapubic tenderness. Right CVA tenderness and no left CVA tenderness.  Skin: Normal skin turgor.  Neuro/Psych:. Mood and affect are appropriate.    Results/Data Urine  COLOR AMBER  APPEARANCE CLEAR  SPECIFIC GRAVITY 1.025  pH 5.5  GLUCOSE NEG mg/dL BILIRUBIN NEG  KETONE NEG mg/dL BLOOD TRACE  PROTEIN NEG mg/dL UROBILINOGEN 1 mg/dL NITRITE NEG  LEUKOCYTE ESTERASE NEG  SQUAMOUS EPITHELIAL/HPF RARE  WBC NONE SEEN WBC/hpf RBC 0-2 RBC/hpf BACTERIA NONE SEEN  CRYSTALS NONE SEEN  CASTS NONE SEEN   The following images/tracing/specimen were independently visualized:  [id number] is a density of calcification 46mm over the lower pole of his right kidney. There also appears to be a density of approximately 1cm between L3-L4, mid ureter, no calcifications in left kidney. There are stable phleboliths in the pelvis but no stones along the course of the left ureter. Renal US indicates moderate right hydronephrosis w/ hydroureter: L 14cm x D 7.37 cm x W 7.02 cm, and thickness 1.60 cm. 7.7 mm right lower pole and 1.1 cm midureterl stone.  The following clinical lab reports were reviewed:  UA: His urine is clear.    Assessment Assessed  1. Kidney stone on right side (592.0)     It appears that his previously noted right UPJ stone on Dec 2013 KUB may have moved distally to mid ureter today and is causing right flank discomfort. Right lower pole stone remains stable. UA is normal and he is afebrile. Pain improves to 1/10 after Toradol injection today and he has been responding to hydromorphone 4mg  for pain PRN.   Plan   Discussion/Summary - I informed patient that due to size of current mid  ureteral stone : 1 cm, it will not be possible to pass w/o surgical interventions.  Discussed right ESWL procedure with patient today due to size and location of stone. In addition, he has hx of 100% calcium oxalate stone which should be easily fragmented w/ the ESWL. Patient is willing to proceed w/ scheduling for procedure. He will stop ASA 325mg  and fish oil today in preparation for the procedure. Surgical scheduling form completed and signed by Dr. Karsten Ro and given to scheduler.  - Rx for hydromorphone 4mg  refilled. Encouraged hydration and take daily stool softener due to side effect of constipation from this pain med.

## 2013-09-25 NOTE — Discharge Instructions (Signed)
Lithotripsy, Care After °Refer to this sheet in the next few weeks. These instructions provide you with information on caring for yourself after your procedure. Your health care provider may also give you more specific instructions. Your treatment has been planned according to current medical practices, but problems sometimes occur. Call your health care provider if you have any problems or questions after your procedure. °WHAT TO EXPECT AFTER THE PROCEDURE  °· Your urine may have a red tinge for a few days after treatment. Blood loss is usually minimal. °· You may have soreness in the back or flank area. This usually goes away after a few days. The procedure can cause blotches or bruises on the back where the pressure wave enters the skin. These marks usually cause only minimal discomfort and should disappear in a short time. °· Stone fragments should begin to pass within 24 hours of treatment. However, a delayed passage is not unusual. °· You may have pain, discomfort, and feel sick to your stomach (nauseated) when the crushed fragments of stone are passed down the tube from the kidney to the bladder. Stone fragments can pass soon after the procedure and may last for up to 4 8 weeks. °· A small number of patients may have severe pain when stone fragments are not able to pass, which leads to an obstruction. °· If your stone is greater than 1 inch (2.5 cm) in diameter or if you have multiple stones that have a combined diameter greater than 1 inch (2.5 cm), you may require more than one treatment. °· If you had a stent placed prior to your procedure, you may experience some discomfort, especially during urination. You may experience the pain or discomfort in your flank or back, or you may experience a sharp pain or discomfort at the base of your penis or in your lower abdomen. The discomfort usually lasts only a few minutes after urinating. °HOME CARE INSTRUCTIONS  °· Rest at home until you feel your energy  improving. °· Only take over-the-counter or prescription medicines for pain, discomfort, or fever as directed by your health care provider. Depending on the type of lithotripsy, you may need to take antibiotics and anti-inflammatory medicines for a few days. °· Drink enough water and fluids to keep your urine clear or pale yellow. This helps "flush" your kidneys. It helps pass any remaining pieces of stone and prevents stones from coming back. °· Most people can resume daily activities within 1 2 days after standard lithotripsy. It can take longer to recover from laser and percutaneous lithotripsy. °· If the stones are in your urinary system, you may be asked to strain your urine at home to look for stones. Any stones that are found can be sent to a medical lab for examination. °· Visit your health care provider for a follow-up appointment in a few weeks. Your doctor may remove your stent if you have one. Your health care provider will also check to see whether stone particles still remain. °SEEK MEDICAL CARE IF:  °· Your pain is not relieved by medicine. °· You have a lasting nauseous feeling. °· You feel there is too much blood in the urine. °· You develop persistent problems with frequent or painful urination that does not at least partially improve after 2 days following the procedure. °· You have a congested cough. °· You feel lightheaded. °· You develop a rash or any other signs that might suggest an allergic problem. °· You develop any reaction or side   effects to your medicine(s). °SEEK IMMEDIATE MEDICAL CARE IF:  °· You experience severe back or flank pain or both. °· You see nothing but blood when you urinate. °· You cannot pass any urine at all. °· You have a fever or shaking chills. °· You develop shortness of breath, difficulty breathing, or chest pain. °· You develop vomiting that will not stop after 6 8 hours. °· You have a fainting episode. °Document Released: 09/17/2007 Document Revised: 06/18/2013  Document Reviewed: 03/13/2013 °Exit[employer]® Patient Information ©2014 [employer]. ° °

## 2013-09-26 ENCOUNTER — Encounter: Payer: Self-pay | Admitting: Family Medicine

## 2013-10-02 ENCOUNTER — Other Ambulatory Visit: Payer: Self-pay | Admitting: Family Medicine

## 2013-10-02 DIAGNOSIS — Z125 Encounter for screening for malignant neoplasm of prostate: Secondary | ICD-10-CM

## 2013-10-02 DIAGNOSIS — E1140 Type 2 diabetes mellitus with diabetic neuropathy, unspecified: Secondary | ICD-10-CM

## 2013-10-02 DIAGNOSIS — N183 Chronic kidney disease, stage 3 unspecified: Secondary | ICD-10-CM

## 2013-10-02 DIAGNOSIS — I10 Essential (primary) hypertension: Secondary | ICD-10-CM

## 2013-10-02 DIAGNOSIS — E785 Hyperlipidemia, unspecified: Secondary | ICD-10-CM

## 2013-10-03 ENCOUNTER — Other Ambulatory Visit (INDEPENDENT_AMBULATORY_CARE_PROVIDER_SITE_OTHER): Payer: Medicare Other

## 2013-10-03 DIAGNOSIS — N183 Chronic kidney disease, stage 3 unspecified: Secondary | ICD-10-CM | POA: Diagnosis not present

## 2013-10-03 DIAGNOSIS — E1142 Type 2 diabetes mellitus with diabetic polyneuropathy: Secondary | ICD-10-CM | POA: Diagnosis not present

## 2013-10-03 DIAGNOSIS — Z125 Encounter for screening for malignant neoplasm of prostate: Secondary | ICD-10-CM | POA: Diagnosis not present

## 2013-10-03 DIAGNOSIS — E1149 Type 2 diabetes mellitus with other diabetic neurological complication: Secondary | ICD-10-CM

## 2013-10-03 DIAGNOSIS — I10 Essential (primary) hypertension: Secondary | ICD-10-CM | POA: Diagnosis not present

## 2013-10-03 DIAGNOSIS — E1140 Type 2 diabetes mellitus with diabetic neuropathy, unspecified: Secondary | ICD-10-CM

## 2013-10-03 DIAGNOSIS — E785 Hyperlipidemia, unspecified: Secondary | ICD-10-CM | POA: Diagnosis not present

## 2013-10-03 LAB — RENAL FUNCTION PANEL
Albumin: 3.7 g/dL (ref 3.5–5.2)
BUN: 25 mg/dL — ABNORMAL HIGH (ref 6–23)
CO2: 27 mEq/L (ref 19–32)
Calcium: 9.8 mg/dL (ref 8.4–10.5)
Chloride: 106 mEq/L (ref 96–112)
Creatinine, Ser: 1.5 mg/dL (ref 0.4–1.5)
GFR: 49.37 mL/min — AB (ref >60.00)
GLUCOSE: 113 mg/dL — AB (ref 70–99)
Phosphorus: 3.6 mg/dL (ref 2.3–4.6)
Potassium: 4.5 mEq/L (ref 3.5–5.1)
SODIUM: 142 meq/L (ref 135–145)

## 2013-10-03 LAB — LIPID PANEL
CHOL/HDL RATIO: 4
Cholesterol: 128 mg/dL (ref 0–200)
HDL: 32.1 mg/dL — ABNORMAL LOW (ref >39.00)
Triglycerides: 255 mg/dL — ABNORMAL HIGH (ref 0.0–149.0)
VLDL: 51 mg/dL — ABNORMAL HIGH (ref 0.0–40.0)

## 2013-10-03 LAB — CBC WITH DIFFERENTIAL/PLATELET
Basophils Absolute: 0 10*3/uL (ref 0.0–0.1)
Basophils Relative: 0.5 % (ref 0.0–3.0)
Eosinophils Absolute: 0.2 10*3/uL (ref 0.0–0.7)
Eosinophils Relative: 3 % (ref 0.0–5.0)
HCT: 36.5 % — ABNORMAL LOW (ref 39.0–52.0)
Hemoglobin: 12.2 g/dL — ABNORMAL LOW (ref 13.0–17.0)
Lymphocytes Relative: 33.3 % (ref 12.0–46.0)
Lymphs Abs: 2.6 10*3/uL (ref 0.7–4.0)
MCHC: 33.5 g/dL (ref 30.0–36.0)
MCV: 89.3 fl (ref 78.0–100.0)
MONO ABS: 0.5 10*3/uL (ref 0.1–1.0)
MONOS PCT: 6.3 % (ref 3.0–12.0)
NEUTROS PCT: 56.9 % (ref 43.0–77.0)
Neutro Abs: 4.4 10*3/uL (ref 1.4–7.7)
PLATELETS: 256 10*3/uL (ref 150.0–400.0)
RBC: 4.08 Mil/uL — ABNORMAL LOW (ref 4.22–5.81)
RDW: 14.8 % — ABNORMAL HIGH (ref 11.5–14.6)
WBC: 7.8 10*3/uL (ref 4.5–10.5)

## 2013-10-03 LAB — HEMOGLOBIN A1C: Hgb A1c MFr Bld: 6.3 % (ref 4.6–6.5)

## 2013-10-03 LAB — LDL CHOLESTEROL, DIRECT: LDL DIRECT: 63.3 mg/dL

## 2013-10-03 LAB — PSA, MEDICARE: PSA: 0.96 ng/mL (ref 0.10–4.00)

## 2013-10-03 LAB — TSH: TSH: 2.35 u[IU]/mL (ref 0.35–5.50)

## 2013-10-06 ENCOUNTER — Other Ambulatory Visit: Payer: Medicare Other

## 2013-10-06 DIAGNOSIS — N200 Calculus of kidney: Secondary | ICD-10-CM | POA: Diagnosis not present

## 2013-10-07 ENCOUNTER — Encounter: Payer: Self-pay | Admitting: Gastroenterology

## 2013-10-08 ENCOUNTER — Other Ambulatory Visit: Payer: Self-pay | Admitting: Family Medicine

## 2013-10-13 ENCOUNTER — Ambulatory Visit (INDEPENDENT_AMBULATORY_CARE_PROVIDER_SITE_OTHER): Payer: Medicare Other | Admitting: Family Medicine

## 2013-10-13 ENCOUNTER — Encounter: Payer: Self-pay | Admitting: Family Medicine

## 2013-10-13 VITALS — BP 148/74 | HR 88 | Temp 98.2°F | Ht 70.0 in | Wt 274.0 lb

## 2013-10-13 DIAGNOSIS — Z0000 Encounter for general adult medical examination without abnormal findings: Secondary | ICD-10-CM | POA: Diagnosis not present

## 2013-10-13 DIAGNOSIS — Z87442 Personal history of urinary calculi: Secondary | ICD-10-CM | POA: Diagnosis not present

## 2013-10-13 DIAGNOSIS — E785 Hyperlipidemia, unspecified: Secondary | ICD-10-CM | POA: Diagnosis not present

## 2013-10-13 DIAGNOSIS — N183 Chronic kidney disease, stage 3 unspecified: Secondary | ICD-10-CM

## 2013-10-13 DIAGNOSIS — E1142 Type 2 diabetes mellitus with diabetic polyneuropathy: Secondary | ICD-10-CM

## 2013-10-13 DIAGNOSIS — E1149 Type 2 diabetes mellitus with other diabetic neurological complication: Secondary | ICD-10-CM

## 2013-10-13 DIAGNOSIS — E1140 Type 2 diabetes mellitus with diabetic neuropathy, unspecified: Secondary | ICD-10-CM

## 2013-10-13 NOTE — Assessment & Plan Note (Signed)
Continue fish oil, fibrate and statin. Uncontrolled trig.  Discussed diet changes to improve trig.

## 2013-10-13 NOTE — Patient Instructions (Addendum)
Call to schedule colonoscopy and ask them to send me a copy. Call your insurance about the shingles shot to see if it is covered or how much it would cost and where is cheaper (here or pharmacy).  If you want to receive here, call for nurse visit. Look into advanced directives - packet provided today. Return in 6 months for follow up diabetes and other issues.

## 2013-10-13 NOTE — Assessment & Plan Note (Signed)
Continue to monitor

## 2013-10-13 NOTE — Progress Notes (Signed)
Subjective:    Patient ID: [name] Young., male    DOB: [birth year], 73 y.o.   MRN: [id number]  HPI CC: medicare wellness visit  Just finished celebrating 51st wedding anniversary.  Since last seen here, has been treated by Dr. Karsten Ro for recurrent and large kidney stones. DM - some paresthesias "feet feel like leather". Lab Results  Component Value Date   HGBA1C 6.3 10/03/2013    Preventative:  Prostate - due for check.  Always normal. Colon - colonoscopy vs flex sig ~1999 normal. Has received letter for rescheduling - will call.  Family h/o colon and rectal cancer. Flu shot - 05/2013 Pneumovax 09/2011 Td 09/2011 Shingles shot - declines Advanced directive: would want wife to be HCPOA.  Does not want prolonged life support.  No falls this year.  Denies depression. No anhedonia. No sadness.  Vision screen passed - next eye appt is end of February. Dental appt is next month. Hearing screen passed.   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  Diet: some water, fruits/vegetables daily, avoids potatoes   Medications and allergies reviewed and updated in chart.  Past histories reviewed and updated if relevant as below. Patient Active Problem List   Diagnosis Date Noted  . Renal calculus, right 09/25/2013  . Renal colic on right side [id number]  . Chronic kidney disease, stage III (moderate) 07/08/2013  . Diabetic nephropathy 07/08/2013  . Tinea corporis 01/16/2013  . RUQ abdominal pain 10/11/2012  . Medicare annual wellness visit, subsequent 10/11/2011  . Type 2 diabetes, controlled, with neuropathy   . HTN (hypertension)   . HLD (hyperlipidemia)   . History of kidney stones   . GERD (gastroesophageal reflux disease)    Past Medical History  Diagnosis Date  . T2DM (type 2 diabetes mellitus) 2010  . GERD (gastroesophageal reflux disease)   . HTN  (hypertension)   . HLD (hyperlipidemia)   . History of kidney stones     ca ox [name] @ [facility]) now [place]  . History of cholelithiasis   . History of pneumonia    Past Surgical History  Procedure Laterality Date  . Umbilical hernia repair      with mesh  . Knee cartilage surgery      left  . Carpal tunnel release      bilateral  . Cataract extraction, bilateral    . Colonoscopy  11/2012    11 adenomatous polyps, diverticulosis, rec rpt 1 yr Ardis Hughs)   History  Substance Use Topics  . Smoking status: Never Smoker   . Smokeless tobacco: Former Systems developer    Types: Tibbie date: 09/08/2001  . Alcohol Use: No   Family History  Problem Relation Age of Onset  . Alzheimer's disease Mother   . Breast cancer Mother     breast  . Stroke Father 37  . Diabetes Father   . Rectal cancer Maternal Grandfather     rectal  . Colon cancer Maternal Grandfather 28  . CAD Neg Hx    No Known Allergies Current Outpatient Prescriptions on File Prior to Visit  Medication Sig Dispense Refill  . aspirin 325 MG tablet Take 325 mg by mouth daily.      . Cholecalciferol (VITAMIN D) 2000 UNITS CAPS Take 1 capsule by mouth daily.      . Choline Fenofibrate (FENOFIBRIC ACID) 135 MG CPDR TAKE ONE CAPSULE  BY MOUTH DAILY  30 capsule  3  . lisinopril (PRINIVIL,ZESTRIL) 20 MG tablet Take 20 mg by mouth at bedtime.      . metFORMIN (GLUCOPHAGE) 1000 MG tablet Take 1,000 mg by mouth 2 (two) times daily with a meal.      . Misc Natural Products (OSTEO BI-FLEX JOINT SHIELD PO) Take 2 tablets by mouth daily.      . Multiple Vitamins-Minerals (MACULAR VITAMIN BENEFIT PO) Take 1 tablet by mouth daily.      . Omega-3 Fatty Acids (FISH OIL) 1200 MG CAPS Take 1 capsule by mouth 3 (three) times daily.      . simvastatin (ZOCOR) 20 MG tablet Take 20 mg by mouth at bedtime.      . tamsulosin (FLOMAX) 0.4 MG CAPS capsule Take 1 capsule (0.4 mg total) by mouth daily after supper.  30 capsule  0   No current  facility-administered medications on file prior to visit.     Review of Systems  Constitutional: Negative for fever, chills, activity change, appetite change, fatigue and unexpected weight change.  HENT: Positive for postnasal drip (sinus drainage). Negative for hearing loss and sinus pressure.   Eyes: Negative for visual disturbance.  Respiratory: Positive for cough (mild). Negative for chest tightness, shortness of breath and wheezing.   Cardiovascular: Negative for chest pain, palpitations and leg swelling.  Gastrointestinal: Positive for abdominal pain (recent kidney stones) and constipation (occasional, not bothersome). Negative for nausea, vomiting, diarrhea, blood in stool and abdominal distention.  Genitourinary: Negative for hematuria and difficulty urinating.  Musculoskeletal: Negative for arthralgias, myalgias and neck pain.  Skin: Negative for rash.  Neurological: Negative for dizziness, seizures, syncope and headaches.  Hematological: Negative for adenopathy. Does not bruise/bleed easily.  Psychiatric/Behavioral: Negative for dysphoric mood. The patient is not nervous/anxious.        Objective:   Physical Exam  Nursing note and vitals reviewed. Constitutional: He is oriented to person, place, and time. He appears well-developed and well-nourished. No distress.  HENT:  Head: Normocephalic and atraumatic.  Right Ear: Hearing, tympanic membrane, external ear and ear canal normal.  Left Ear: Hearing, tympanic membrane, external ear and ear canal normal.  Nose: Nose normal.  Mouth/Throat: Oropharynx is clear and moist. No oropharyngeal exudate.  Eyes: Conjunctivae and EOM are normal. Pupils are equal, round, and reactive to light. No scleral icterus.  Neck: Normal range of motion. Neck supple. Carotid bruit is not present. No thyromegaly present.  Cardiovascular: Normal rate, regular rhythm, normal heart sounds and intact distal pulses.   No murmur heard. Pulses:      Radial  pulses are 2+ on the right side, and 2+ on the left side.  Pulmonary/Chest: Effort normal and breath sounds normal. No respiratory distress. He has no wheezes. He has no rales.  Abdominal: Soft. Bowel sounds are normal. He exhibits no distension and no mass. There is no tenderness. There is no rebound and no guarding.  Genitourinary: Rectum normal and prostate normal. Rectal exam shows no external hemorrhoid, no internal hemorrhoid, no fissure, no mass, no tenderness and anal tone normal. Prostate is not enlarged (20gm) and not tender.  Musculoskeletal: Normal range of motion. He exhibits no edema.  Diabetic foot exam: Normal inspection No skin breakdown Early callus formation bilateral soles Normal DP/PT pulses Normal sensation to light touch and decreased to monofilament Nails normal  Lymphadenopathy:    He has no cervical adenopathy.  Neurological: He is alert and oriented to person, place, and time.  CN grossly intact, station and gait intact 2/3 recall, 3/3 with cue [id number]  Skin: Skin is warm and dry. No rash noted.  Psychiatric: He has a normal mood and affect. His behavior is normal. Judgment and thought content normal.       Assessment & Plan:

## 2013-10-13 NOTE — Progress Notes (Signed)
Pre-visit discussion using our clinic review tool. No additional management support is needed unless otherwise documented below in the visit note.  

## 2013-10-13 NOTE — Assessment & Plan Note (Signed)
Followed by urology s/p recent lithotripsy.

## 2013-10-13 NOTE — Assessment & Plan Note (Addendum)
I have personally reviewed the Medicare Annual Wellness questionnaire and have noted 1. The patient's medical and social history 2. Their use of alcohol, tobacco or illicit drugs 3. Their current medications and supplements 4. The patient's functional ability including ADL's, fall risks, home safety risks and hearing or visual impairment. 5. Diet and physical activity 6. Evidence for depression or mood disorders The patients weight, height, BMI have been recorded in the chart.  Hearing and vision has been addressed. I have made referrals, counseling and provided education to the patient based review of the above and I have provided the pt with a written personalized care plan for preventive services. See scanned questionairre. Advanced directives discussed: would want wife to be HCproxy.  Handout provided today.  Reviewed preventative protocols and updated unless pt declined.  Pt will call to schedule colonsocopy. DRE/PSA reassuring today.

## 2013-10-13 NOTE — Assessment & Plan Note (Signed)
Foot exam today. rtc 6 mo for f/u DM Good control today as evidenced by recent A1c.

## 2013-10-16 ENCOUNTER — Telehealth: Payer: Self-pay

## 2013-10-16 NOTE — Telephone Encounter (Signed)
Relevant patient education mailed to patient.  

## 2013-10-19 ENCOUNTER — Other Ambulatory Visit: Payer: Self-pay | Admitting: Family Medicine

## 2013-10-22 ENCOUNTER — Other Ambulatory Visit: Payer: Self-pay | Admitting: Family Medicine

## 2013-10-22 ENCOUNTER — Encounter: Payer: Self-pay | Admitting: Gastroenterology

## 2013-11-05 ENCOUNTER — Ambulatory Visit (INDEPENDENT_AMBULATORY_CARE_PROVIDER_SITE_OTHER): Payer: Medicare Other | Admitting: Family Medicine

## 2013-11-05 ENCOUNTER — Encounter: Payer: Self-pay | Admitting: Family Medicine

## 2013-11-05 VITALS — BP 130/88 | HR 77 | Temp 97.5°F | Wt 281.5 lb

## 2013-11-05 DIAGNOSIS — J209 Acute bronchitis, unspecified: Secondary | ICD-10-CM | POA: Diagnosis not present

## 2013-11-05 MED ORDER — AZITHROMYCIN 250 MG PO TABS: ORAL_TABLET | ORAL | Status: DC

## 2013-11-05 MED ORDER — AMOXICILLIN 500 MG PO CAPS: 1000.0000 mg | ORAL_CAPSULE | Freq: Two times a day (BID) | ORAL | Status: DC

## 2013-11-05 MED ORDER — HYDROCODONE-HOMATROPINE 5-1.5 MG/5ML PO SYRP: ORAL_SOLUTION | ORAL | Status: DC

## 2013-11-05 NOTE — Progress Notes (Signed)
Date:  11/05/2013   Name:  [name] Young.   DOB:  April 15, 1941   MRN:  [id number] Gender: male Age: 73 y.o.  Primary Physician:  Ria Bush, MD   Chief Complaint: Cough   Subjective:   History of Present Illness:  [name] Young. is a 73 y.o. very pleasant male patient who presents with the following:  Cannot lay down and sleeping in a recliner for two nights in a row. Coughing and giving out a lot. Started off with a sore throat on Saturday.  Then developed sinus congestion, and coughing has been profuse.   Really stopped up. Used some afrin.  Never been a smoker.  Not running a fever. No aching.   Tried some mucinex  Past Medical History, Surgical History, Social History, Family History, Problem List, Medications, and Allergies have been reviewed and updated if relevant.  Review of Systems: ROS: GEN: Acute illness details above GI: Tolerating PO intake GU: maintaining adequate hydration and urination Pulm: No SOB Interactive and getting along well at home.  Otherwise, ROS is as per the HPI.   Objective:   Physical Examination: BP 130/88  Pulse 77  Temp(Src) 97.5 F (36.4 C) (Oral)  Wt 281 lb 8 oz (127.688 kg)  SpO2 95%   GEN: A and O x 3. WDWN. NAD.    ENT: Nose clear, ext NML.  No LAD.  No JVD.  TM's clear. Oropharynx clear.  PULM: Normal WOB, no distress. No crackles, wheezes, rhonchi. CV: RRR, no M/G/R, No rubs, No JVD.   EXT: warm and well-perfused, No c/c/e. PSYCH: Pleasant and conversant.    Laboratory and Imaging Data:  Assessment & Plan:   Acute bronchitis  Bronchitis vs early pna. Will treat with abx given pending snowstorm. Hycodan prn  New Prescriptions   AMOXICILLIN (AMOXIL) 500 MG CAPSULE    Take 2 capsules (1,000 mg total) by mouth 2 (two) times daily.   HYDROCODONE-HOMATROPINE (HYCODAN) 5-1.5 MG/5ML SYRUP    1 tsp po at night before bed prn cough   No orders of the defined types were placed in this encounter.     Signed,  Maud Deed. Abbi Mancini, MD, [place] [facility] [place] [address] Phone: [phone] Fax: [phone]  There are no Patient Instructions on file for this visit. Patient's Medications  New Prescriptions   AMOXICILLIN (AMOXIL) 500 MG CAPSULE    Take 2 capsules (1,000 mg total) by mouth 2 (two) times daily.   HYDROCODONE-HOMATROPINE (HYCODAN) 5-1.5 MG/5ML SYRUP    1 tsp po at night before bed prn cough  Previous Medications   ASPIRIN 325 MG TABLET    Take 325 mg by mouth daily.   CHOLECALCIFEROL (VITAMIN D) 2000 UNITS CAPS    Take 1 capsule by mouth daily.   CHOLINE FENOFIBRATE (FENOFIBRIC ACID) 135 MG CPDR    TAKE ONE CAPSULE BY MOUTH DAILY   GLUCOSAMINE-CHONDROITIN (OSTEO BI-FLEX REGULAR STRENGTH PO)    Take 2 tablets by mouth daily.   LISINOPRIL (PRINIVIL,ZESTRIL) 20 MG TABLET    TAKE ONE (1) TABLET BY MOUTH EVERY DAY   METFORMIN (GLUCOPHAGE) 1000 MG TABLET    TAKE 1 TABLET BY MOUTH TWICE A DAY WITH A MEAL   MULTIPLE VITAMINS-MINERALS (MACULAR VITAMIN BENEFIT PO)    Take 1 tablet by mouth daily.   OMEGA-3 FATTY ACIDS (FISH OIL) 1200 MG CAPS    Take 1 capsule by mouth 3 (three) times  daily.   SIMVASTATIN (ZOCOR) 20 MG TABLET    TAKE ONE TABLET BY MOUTH EVERY NIGHT AT BEDTIME  Modified Medications   No medications on file  Discontinued Medications   METFORMIN (GLUCOPHAGE) 1000 MG TABLET    Take 1,000 mg by mouth 2 (two) times daily with a meal.   MISC NATURAL PRODUCTS (OSTEO BI-FLEX JOINT SHIELD PO)    Take 2 tablets by mouth daily.   SIMVASTATIN (ZOCOR) 20 MG TABLET    Take 20 mg by mouth at bedtime.   TAMSULOSIN (FLOMAX) 0.4 MG CAPS CAPSULE    Take 1 capsule (0.4 mg total) by mouth daily after supper.

## 2013-11-05 NOTE — Progress Notes (Signed)
Pre visit review using our clinic review tool, if applicable. No additional management support is needed unless otherwise documented below in the visit note. 

## 2013-11-27 DIAGNOSIS — H26499 Other secondary cataract, unspecified eye: Secondary | ICD-10-CM | POA: Diagnosis not present

## 2013-11-27 DIAGNOSIS — E119 Type 2 diabetes mellitus without complications: Secondary | ICD-10-CM | POA: Diagnosis not present

## 2013-11-27 DIAGNOSIS — H353190 Nonexudative age-related macular degeneration, unspecified eye, stage unspecified: Secondary | ICD-10-CM | POA: Diagnosis not present

## 2013-12-10 HISTORY — PX: COLONOSCOPY: SHX174

## 2013-12-10 LAB — HM DIABETES EYE EXAM

## 2013-12-22 ENCOUNTER — Ambulatory Visit (AMBULATORY_SURGERY_CENTER): Payer: Self-pay | Admitting: *Deleted

## 2013-12-22 VITALS — Ht 71.0 in | Wt 277.6 lb

## 2013-12-22 DIAGNOSIS — Z86010 Personal history of colonic polyps: Secondary | ICD-10-CM

## 2013-12-22 MED ORDER — MOVIPREP 100 G PO SOLR: ORAL | Status: DC

## 2013-12-22 NOTE — Progress Notes (Signed)
No egg or soy allergy  No home oxygen use or problems with anesthesia

## 2014-01-05 ENCOUNTER — Ambulatory Visit (AMBULATORY_SURGERY_CENTER): Payer: Medicare Other | Admitting: Gastroenterology

## 2014-01-05 ENCOUNTER — Encounter: Payer: Self-pay | Admitting: Gastroenterology

## 2014-01-05 VITALS — BP 132/73 | HR 52 | Temp 96.7°F | Resp 14 | Ht 71.0 in | Wt 277.0 lb

## 2014-01-05 DIAGNOSIS — E119 Type 2 diabetes mellitus without complications: Secondary | ICD-10-CM | POA: Diagnosis not present

## 2014-01-05 DIAGNOSIS — Z1211 Encounter for screening for malignant neoplasm of colon: Secondary | ICD-10-CM | POA: Diagnosis not present

## 2014-01-05 DIAGNOSIS — K5730 Diverticulosis of large intestine without perforation or abscess without bleeding: Secondary | ICD-10-CM

## 2014-01-05 DIAGNOSIS — Z86010 Personal history of colonic polyps: Secondary | ICD-10-CM | POA: Diagnosis not present

## 2014-01-05 DIAGNOSIS — D126 Benign neoplasm of colon, unspecified: Secondary | ICD-10-CM

## 2014-01-05 DIAGNOSIS — K219 Gastro-esophageal reflux disease without esophagitis: Secondary | ICD-10-CM | POA: Diagnosis not present

## 2014-01-05 DIAGNOSIS — E669 Obesity, unspecified: Secondary | ICD-10-CM | POA: Diagnosis not present

## 2014-01-05 DIAGNOSIS — R1011 Right upper quadrant pain: Secondary | ICD-10-CM | POA: Diagnosis not present

## 2014-01-05 DIAGNOSIS — I10 Essential (primary) hypertension: Secondary | ICD-10-CM | POA: Diagnosis not present

## 2014-01-05 LAB — GLUCOSE, CAPILLARY
Glucose-Capillary: 96 mg/dL (ref 70–99)
Glucose-Capillary: 138 mg/dL — ABNORMAL HIGH (ref 70–99)
Glucose-Capillary: 71 mg/dL (ref 70–99)

## 2014-01-05 MED ORDER — SODIUM CHLORIDE 0.9 % IV SOLN: 500.0000 mL | INTRAVENOUS | Status: DC

## 2014-01-05 NOTE — Progress Notes (Addendum)
Pt. Alert and awake CBG=71 no s/s of hypoglycemia, doctor made aware of blood sugar order received to give D5W @ [facility]. CBG repeated pt. Blood sugar=138,pt. Stated I feel better. Pt. Received 100CC of D5W.

## 2014-01-05 NOTE — Op Note (Signed)
[place]  [address] [place], [id number]   COLONOSCOPY PROCEDURE REPORT  PATIENT: [name] Young, [name] Young  MR#: [id number] BIRTHDATE: 1941-06-25 , 72  yrs. old GENDER: Male ENDOSCOPIST: Milus Banister, MD PROCEDURE DATE:  01/05/2014 PROCEDURE:   Colonoscopy with snare polypectomy First Screening Colonoscopy - Avg.  risk and is 50 yrs.  old or older - No.  Prior Negative Screening - Now for repeat screening. N/A  History of Adenoma - Now for follow-up colonoscopy & has been > or = to 3 yrs.  No.  It has been less than 3 yrs since last colonoscopy.  Medical reason.  Polyps Removed Today? Yes. ASA CLASS:   Class II INDICATIONS:11 polyps removed 1 year ago, 10 of them were adenomatous. MEDICATIONS: MAC sedation, administered by CRNA and propofol (Diprivan) 250mg  IV  DESCRIPTION OF PROCEDURE:   After the risks benefits and alternatives of the procedure were thoroughly explained, informed consent was obtained.  A digital rectal exam revealed no abnormalities of the rectum.   The [id number]  endoscope was introduced through the anus and advanced to the cecum, which was identified by both the appendix and ileocecal valve. No adverse events experienced.   The quality of the prep was excellent.  The instrument was then slowly withdrawn as the colon was fully examined.   COLON FINDINGS: Four polyps were found, removed and sent to pathology.  These were all sessile, ranged in size from 3-23mm across, located in ascending and transverse segment, removed with cold snare.  There were numerous diverticulum in the left colon. The examination was otherwise normal.  Retroflexed views revealed no abnormalities. The time to cecum=1 minutes 38 seconds. Withdrawal time=14 minutes 04 seconds.  The scope was withdrawn and the procedure completed. COMPLICATIONS: There were no complications.  ENDOSCOPIC IMPRESSION: Four polyps were found, removed and sent to pathology.   These were all sessile, ranged in size from 3-12mm across, located in ascending and transverse segment, removed with cold snare.  There were numerous diverticulum in the left colon.  The examination was otherwise normal.  RECOMMENDATIONS: If the polyp(s) removed today are proven to be adenomatous (pre-cancerous) polyps, you will need a colonoscopy in 3-5 years. You will receive a letter within 1-2 weeks with the results of your biopsy as well as final recommendations.  Please call my office if you have not received a letter after 3 weeks.   eSigned:  Milus Banister, MD 01/05/2014 9:42 AM   cc: Emi Holes, MD

## 2014-01-05 NOTE — Progress Notes (Signed)
Called to room to assist during endoscopic procedure.  Patient ID and intended procedure confirmed with present staff. Received instructions for my participation in the procedure from the performing physician.  

## 2014-01-05 NOTE — Patient Instructions (Signed)
YOU HAD AN ENDOSCOPIC PROCEDURE TODAY AT THE [facility]: Refer to the procedure report that was given to you for any specific questions about what was found during the examination.  If the procedure report does not answer your questions, please call your gastroenterologist to clarify.  If you requested that your care partner not be given the details of your procedure findings, then the procedure report has been included in a sealed envelope for you to review at your convenience later.  YOU SHOULD EXPECT: Some feelings of bloating in the abdomen. Passage of more gas than usual.  Walking can help get rid of the air that was put into your GI tract during the procedure and reduce the bloating. If you had a lower endoscopy (such as a colonoscopy or flexible sigmoidoscopy) you may notice spotting of blood in your stool or on the toilet paper. If you underwent a bowel prep for your procedure, then you may not have a normal bowel movement for a few days.  DIET: Your first meal following the procedure should be a light meal and then it is ok to progress to your normal diet.  A half-sandwich or bowl of soup is an example of a good first meal.  Heavy or fried foods are harder to digest and may make you feel nauseous or bloated.  Likewise meals heavy in dairy and vegetables can cause extra gas to form and this can also increase the bloating.  Drink plenty of fluids but you should avoid alcoholic beverages for 24 hours.  ACTIVITY: Your care partner should take you home directly after the procedure.  You should plan to take it easy, moving slowly for the rest of the day.  You can resume normal activity the day after the procedure however you should NOT DRIVE or use heavy machinery for 24 hours (because of the sedation medicines used during the test).    SYMPTOMS TO REPORT IMMEDIATELY: A gastroenterologist can be reached at any hour.  During normal business hours, 8:30 AM to 5:00 PM Monday through Friday,  call [phone].  After hours and on weekends, please call the GI answering service at [phone] who will take a message and have the physician on call contact you.   Following lower endoscopy (colonoscopy or flexible sigmoidoscopy):  Excessive amounts of blood in the stool  Significant tenderness or worsening of abdominal pains  Swelling of the abdomen that is new, acute  Fever of 100F or higher   FOLLOW UP: If any biopsies were taken you will be contacted by phone or by letter within the next 1-3 weeks.  Call your gastroenterologist if you have not heard about the biopsies in 3 weeks.  Our staff will call the home number listed on your records the next business day following your procedure to check on you and address any questions or concerns that you may have at that time regarding the information given to you following your procedure. This is a courtesy call and so if there is no answer at the home number and we have not heard from you through the emergency physician on call, we will assume that you have returned to your regular daily activities without incident.  SIGNATURES/CONFIDENTIALITY: You and/or your care partner have signed paperwork which will be entered into your electronic medical record.  These signatures attest to the fact that that the information above on your After Visit Summary has been reviewed and is understood.  Full responsibility of the confidentiality of   this discharge information lies with you and/or your care-partner.   Resume medications. Information given on polyps,diverticulosis and high fiber diet with discharge instructions. 

## 2014-01-05 NOTE — Addendum Note (Signed)
Addended by: Cannon Kettle on: 01/05/2014 11:30 AM   Modules accepted: Orders

## 2014-01-05 NOTE — Progress Notes (Signed)
Report to pacu rn, vss, bbs=clear 

## 2014-01-06 ENCOUNTER — Telehealth: Payer: Self-pay

## 2014-01-06 NOTE — Telephone Encounter (Signed)
  Follow up Call-  Call back number 01/05/2014 12/04/2012  Post procedure Call Back phone  # [phone]  Permission to leave phone message Yes Yes     Patient questions:  Do you have a fever, pain , or abdominal swelling? no Pain Score  0 *  Have you tolerated food without any problems? yes  Have you been able to return to your normal activities? yes  Do you have any questions about your discharge instructions: Diet   no Medications  no Follow up visit  no  Do you have questions or concerns about your Care? no  Actions: * If pain score is 4 or above: No action needed, pain <4.

## 2014-01-09 ENCOUNTER — Encounter: Payer: Self-pay | Admitting: Gastroenterology

## 2014-02-02 ENCOUNTER — Other Ambulatory Visit: Payer: Self-pay | Admitting: Family Medicine

## 2014-04-07 DIAGNOSIS — N200 Calculus of kidney: Secondary | ICD-10-CM | POA: Diagnosis not present

## 2014-04-13 ENCOUNTER — Ambulatory Visit: Payer: Medicare Other | Admitting: Family Medicine

## 2014-04-19 ENCOUNTER — Other Ambulatory Visit: Payer: Self-pay | Admitting: Family Medicine

## 2014-04-28 ENCOUNTER — Ambulatory Visit (INDEPENDENT_AMBULATORY_CARE_PROVIDER_SITE_OTHER): Payer: Medicare Other | Admitting: Family Medicine

## 2014-04-28 ENCOUNTER — Encounter: Payer: Self-pay | Admitting: Family Medicine

## 2014-04-28 VITALS — BP 136/72 | HR 76 | Temp 98.1°F | Wt 278.0 lb

## 2014-04-28 DIAGNOSIS — E1142 Type 2 diabetes mellitus with diabetic polyneuropathy: Secondary | ICD-10-CM | POA: Diagnosis not present

## 2014-04-28 DIAGNOSIS — E1140 Type 2 diabetes mellitus with diabetic neuropathy, unspecified: Secondary | ICD-10-CM

## 2014-04-28 DIAGNOSIS — E1129 Type 2 diabetes mellitus with other diabetic kidney complication: Secondary | ICD-10-CM

## 2014-04-28 DIAGNOSIS — E1149 Type 2 diabetes mellitus with other diabetic neurological complication: Secondary | ICD-10-CM | POA: Diagnosis not present

## 2014-04-28 DIAGNOSIS — E785 Hyperlipidemia, unspecified: Secondary | ICD-10-CM | POA: Diagnosis not present

## 2014-04-28 DIAGNOSIS — N183 Chronic kidney disease, stage 3 unspecified: Secondary | ICD-10-CM | POA: Diagnosis not present

## 2014-04-28 DIAGNOSIS — E1121 Type 2 diabetes mellitus with diabetic nephropathy: Secondary | ICD-10-CM

## 2014-04-28 DIAGNOSIS — I10 Essential (primary) hypertension: Secondary | ICD-10-CM

## 2014-04-28 DIAGNOSIS — Z23 Encounter for immunization: Secondary | ICD-10-CM

## 2014-04-28 DIAGNOSIS — N058 Unspecified nephritic syndrome with other morphologic changes: Secondary | ICD-10-CM

## 2014-04-28 NOTE — Patient Instructions (Addendum)
prevnar today. Return at your convenience in next few weeks for fasting labwork. Return in 6 months for annual exam (medicare wellness). Good to see you today, call us with questions.

## 2014-04-28 NOTE — Addendum Note (Signed)
Addended by: Royann Shivers A on: 04/28/2014 08:44 AM   Modules accepted: Orders

## 2014-04-28 NOTE — Progress Notes (Signed)
Pre visit review using our clinic review tool, if applicable. No additional management support is needed unless otherwise documented below in the visit note. 

## 2014-04-28 NOTE — Addendum Note (Signed)
Addended by: Ria Bush on: 04/28/2014 08:27 AM   Modules accepted: Orders

## 2014-04-28 NOTE — Assessment & Plan Note (Signed)
Chronic, stable. Continue metformin Foot exam today. Check A1c next labwork.

## 2014-04-28 NOTE — Assessment & Plan Note (Signed)
Chronic, stable. Continue regimen of lisinopril 20mg  daily.

## 2014-04-28 NOTE — Assessment & Plan Note (Signed)
See above

## 2014-04-28 NOTE — Progress Notes (Signed)
BP 136/72  Pulse 76  Temp(Src) 98.1 F (36.7 C) (Oral)  Wt 278 lb (126.1 kg)   CC: 80mo f/u visit  Subjective:    Patient ID: [name] Luna., male    DOB: [birth year], 73 y.o.   MRN: [id number]  HPI: [name] Aye. is a 73 y.o. male presenting on 04/28/2014 for Follow-up   DM - regularly does not check sugars.  Compliant with antihyperglycemic regimen which includes: metformin 1000mg  twice daily.  Denies low sugars or hypoglycemic symptoms.  Denies paresthesias. Last diabetic eye exam 12/2013.  Pneumovax: 2013.  Prevnar: today.  Lab Results  Component Value Date   HGBA1C 6.3 10/03/2013    HTN - Compliant with current antihypertensive regimen of lisinopril 20mg  daily.  Does not check blood pressures at home.  No low blood pressure readings or symptoms of dizziness/syncope.  Denies HA, vision changes, CP/tightness, SOB, leg swelling.   HLD - on fish oil, fibrate and statin.  Kidney stones stable (ottelin). Not fasting today - oatmeal with fruit for breakfast at [facility]  Relevant past medical, surgical, family and social history reviewed and updated as indicated.  Allergies and medications reviewed and updated. Current Outpatient Prescriptions on File Prior to Visit  Medication Sig  . aspirin 325 MG tablet Take 325 mg by mouth daily.  . Cholecalciferol (VITAMIN D) 2000 UNITS CAPS Take 1 capsule by mouth daily.  . Choline Fenofibrate (FENOFIBRIC ACID) 135 MG CPDR TAKE ONE CAPSULE BY MOUTH DAILY  . Glucosamine-Chondroitin (OSTEO BI-FLEX REGULAR STRENGTH PO) Take 2 tablets by mouth daily.  Marland Kitchen lisinopril (PRINIVIL,ZESTRIL) 20 MG tablet TAKE ONE (1) TABLET BY MOUTH EVERY DAY  . metFORMIN (GLUCOPHAGE) 1000 MG tablet TAKE 1 TABLET BY MOUTH TWICE A DAY WITH A MEAL  . Multiple Vitamins-Minerals (MACULAR VITAMIN BENEFIT PO) Take 1 tablet by mouth daily.  . Omega-3 Fatty Acids (FISH OIL) 1200 MG CAPS Take 1 capsule by mouth 3 (three) times daily.  . simvastatin (ZOCOR) 20 MG  tablet TAKE ONE TABLET BY MOUTH EVERY NIGHT AT BEDTIME   No current facility-administered medications on file prior to visit.    Review of Systems Per HPI unless specifically indicated above    Objective:    BP 136/72  Pulse 76  Temp(Src) 98.1 F (36.7 C) (Oral)  Wt 278 lb (126.1 kg)  Physical Exam  Nursing note and vitals reviewed. Constitutional: He appears well-developed and well-nourished. No distress.  HENT:  Mouth/Throat: Oropharynx is clear and moist. No oropharyngeal exudate.  Cardiovascular: Normal rate, regular rhythm, normal heart sounds and intact distal pulses.   No murmur heard. Pulmonary/Chest: Effort normal and breath sounds normal. No respiratory distress. He has no wheezes. He has no rales.  Musculoskeletal: He exhibits no edema.  Diabetic foot exam: Normal inspection No skin breakdown No calluses  Slightly diminished DP pulses Normal sensation to light touch and monofilament Nails normal  Skin: Skin is warm and dry. No rash noted.       Assessment & Plan:   Problem List Items Addressed This Visit   Type 2 diabetes, controlled, with neuropathy - Primary     Chronic, stable. Continue metformin Foot exam today. Check A1c next labwork.    Relevant Orders      Hemoglobin A1c   HTN (hypertension)     Chronic, stable. Continue regimen of lisinopril 20mg  daily.    HLD (hyperlipidemia)     Continue current regimen, check FLP next fasting labwork.    Relevant Orders  Lipid panel   Chronic kidney disease, stage III (moderate)     Check renal panel when returns for labwork.    Relevant Orders      Renal function panel   Diabetic nephropathy     See above.        Follow up plan: Return in about 6 months (around 10/29/2014), or as needed, for medicare wellness.

## 2014-04-28 NOTE — Assessment & Plan Note (Signed)
Continue current regimen, check FLP next fasting labwork.

## 2014-04-28 NOTE — Assessment & Plan Note (Signed)
Check renal panel when returns for labwork.

## 2014-04-29 ENCOUNTER — Other Ambulatory Visit (INDEPENDENT_AMBULATORY_CARE_PROVIDER_SITE_OTHER): Payer: Medicare Other

## 2014-04-29 DIAGNOSIS — E1140 Type 2 diabetes mellitus with diabetic neuropathy, unspecified: Secondary | ICD-10-CM

## 2014-04-29 DIAGNOSIS — E785 Hyperlipidemia, unspecified: Secondary | ICD-10-CM | POA: Diagnosis not present

## 2014-04-29 DIAGNOSIS — I10 Essential (primary) hypertension: Secondary | ICD-10-CM

## 2014-04-29 DIAGNOSIS — N183 Chronic kidney disease, stage 3 unspecified: Secondary | ICD-10-CM

## 2014-04-29 DIAGNOSIS — E1149 Type 2 diabetes mellitus with other diabetic neurological complication: Secondary | ICD-10-CM | POA: Diagnosis not present

## 2014-04-29 DIAGNOSIS — E1142 Type 2 diabetes mellitus with diabetic polyneuropathy: Secondary | ICD-10-CM

## 2014-04-29 LAB — RENAL FUNCTION PANEL
ALBUMIN: 3.6 g/dL (ref 3.5–5.2)
BUN: 20 mg/dL (ref 6–23)
CO2: 26 mEq/L (ref 19–32)
Calcium: 9.5 mg/dL (ref 8.4–10.5)
Chloride: 107 mEq/L (ref 96–112)
Creatinine, Ser: 1.4 mg/dL (ref 0.4–1.5)
GFR: 55.24 mL/min — ABNORMAL LOW (ref >60.00)
Glucose, Bld: 112 mg/dL — ABNORMAL HIGH (ref 70–99)
PHOSPHORUS: 2.8 mg/dL (ref 2.3–4.6)
POTASSIUM: 4.3 meq/L (ref 3.5–5.1)
Sodium: 142 mEq/L (ref 135–145)

## 2014-04-29 LAB — LIPID PANEL
Cholesterol: 122 mg/dL (ref 0–200)
HDL: 27.2 mg/dL — AB (ref >39.00)
NONHDL: 94.8
TRIGLYCERIDES: 263 mg/dL — AB (ref 0.0–149.0)
Total CHOL/HDL Ratio: 4
VLDL: 52.6 mg/dL — ABNORMAL HIGH (ref 0.0–40.0)

## 2014-04-29 LAB — HEMOGLOBIN A1C: Hgb A1c MFr Bld: 6.7 % — ABNORMAL HIGH (ref 4.6–6.5)

## 2014-04-29 LAB — MICROALBUMIN / CREATININE URINE RATIO
Creatinine,U: 194.4 mg/dL
Microalb Creat Ratio: 0.7 mg/g (ref 0.0–30.0)
Microalb, Ur: 1.4 mg/dL (ref 0.0–1.9)

## 2014-04-29 LAB — LDL CHOLESTEROL, DIRECT: LDL DIRECT: 58.5 mg/dL

## 2014-05-01 ENCOUNTER — Encounter: Payer: Self-pay | Admitting: *Deleted

## 2014-05-06 ENCOUNTER — Other Ambulatory Visit: Payer: Self-pay | Admitting: Family Medicine

## 2014-06-04 ENCOUNTER — Other Ambulatory Visit: Payer: Self-pay | Admitting: Family Medicine

## 2014-06-16 DIAGNOSIS — Z23 Encounter for immunization: Secondary | ICD-10-CM | POA: Diagnosis not present

## 2014-10-19 ENCOUNTER — Other Ambulatory Visit: Payer: Self-pay | Admitting: Family Medicine

## 2014-10-28 ENCOUNTER — Other Ambulatory Visit: Payer: Self-pay | Admitting: Family Medicine

## 2014-10-28 DIAGNOSIS — I10 Essential (primary) hypertension: Secondary | ICD-10-CM

## 2014-10-28 DIAGNOSIS — N183 Chronic kidney disease, stage 3 unspecified: Secondary | ICD-10-CM

## 2014-10-28 DIAGNOSIS — Z125 Encounter for screening for malignant neoplasm of prostate: Secondary | ICD-10-CM

## 2014-10-28 DIAGNOSIS — E785 Hyperlipidemia, unspecified: Secondary | ICD-10-CM

## 2014-10-28 DIAGNOSIS — E1140 Type 2 diabetes mellitus with diabetic neuropathy, unspecified: Secondary | ICD-10-CM

## 2014-10-29 ENCOUNTER — Other Ambulatory Visit (INDEPENDENT_AMBULATORY_CARE_PROVIDER_SITE_OTHER): Payer: Medicare Other

## 2014-10-29 DIAGNOSIS — E1140 Type 2 diabetes mellitus with diabetic neuropathy, unspecified: Secondary | ICD-10-CM | POA: Diagnosis not present

## 2014-10-29 DIAGNOSIS — I10 Essential (primary) hypertension: Secondary | ICD-10-CM | POA: Diagnosis not present

## 2014-10-29 DIAGNOSIS — Z125 Encounter for screening for malignant neoplasm of prostate: Secondary | ICD-10-CM | POA: Diagnosis not present

## 2014-10-29 DIAGNOSIS — N183 Chronic kidney disease, stage 3 unspecified: Secondary | ICD-10-CM

## 2014-10-29 DIAGNOSIS — E785 Hyperlipidemia, unspecified: Secondary | ICD-10-CM | POA: Diagnosis not present

## 2014-10-29 LAB — LIPID PANEL
Cholesterol: 141 mg/dL (ref 0–200)
HDL: 29.4 mg/dL — AB (ref >39.00)
NONHDL: 111.6
TRIGLYCERIDES: 303 mg/dL — AB (ref 0.0–149.0)
Total CHOL/HDL Ratio: 5
VLDL: 60.6 mg/dL — ABNORMAL HIGH (ref 0.0–40.0)

## 2014-10-29 LAB — COMPREHENSIVE METABOLIC PANEL
ALK PHOS: 32 U/L — AB (ref 39–117)
ALT: 24 U/L (ref 0–53)
AST: 24 U/L (ref 0–37)
Albumin: 4 g/dL (ref 3.5–5.2)
BUN: 23 mg/dL (ref 6–23)
CALCIUM: 9.8 mg/dL (ref 8.4–10.5)
CHLORIDE: 105 meq/L (ref 96–112)
CO2: 29 mEq/L (ref 19–32)
CREATININE: 1.32 mg/dL (ref 0.40–1.50)
GFR: 56.61 mL/min — ABNORMAL LOW (ref >60.00)
Glucose, Bld: 124 mg/dL — ABNORMAL HIGH (ref 70–99)
Potassium: 4.6 mEq/L (ref 3.5–5.1)
Sodium: 141 mEq/L (ref 135–145)
Total Bilirubin: 0.8 mg/dL (ref 0.2–1.2)
Total Protein: 6.8 g/dL (ref 6.0–8.3)

## 2014-10-29 LAB — CBC WITH DIFFERENTIAL/PLATELET
BASOS PCT: 0.3 % (ref 0.0–3.0)
Basophils Absolute: 0 10*3/uL (ref 0.0–0.1)
Eosinophils Absolute: 0.2 10*3/uL (ref 0.0–0.7)
Eosinophils Relative: 2.7 % (ref 0.0–5.0)
HCT: 40.2 % (ref 39.0–52.0)
HEMOGLOBIN: 13.5 g/dL (ref 13.0–17.0)
LYMPHS ABS: 2.5 10*3/uL (ref 0.7–4.0)
LYMPHS PCT: 32.3 % (ref 12.0–46.0)
MCHC: 33.6 g/dL (ref 30.0–36.0)
MCV: 88.8 fl (ref 78.0–100.0)
MONOS PCT: 6.3 % (ref 3.0–12.0)
Monocytes Absolute: 0.5 10*3/uL (ref 0.1–1.0)
NEUTROS ABS: 4.4 10*3/uL (ref 1.4–7.7)
Neutrophils Relative %: 58.4 % (ref 43.0–77.0)
Platelets: 212 10*3/uL (ref 150.0–400.0)
RBC: 4.53 Mil/uL (ref 4.22–5.81)
RDW: 14.6 % (ref 11.5–15.5)
WBC: 7.6 10*3/uL (ref 4.0–10.5)

## 2014-10-29 LAB — PSA, MEDICARE: PSA: 0.67 ng/ml (ref 0.10–4.00)

## 2014-10-29 LAB — VITAMIN D 25 HYDROXY (VIT D DEFICIENCY, FRACTURES): VITD: 49.31 ng/mL (ref 30.00–100.00)

## 2014-10-29 LAB — HEMOGLOBIN A1C: Hgb A1c MFr Bld: 6.5 % (ref 4.6–6.5)

## 2014-10-29 LAB — LDL CHOLESTEROL, DIRECT: LDL DIRECT: 58 mg/dL

## 2014-10-30 ENCOUNTER — Encounter: Payer: Self-pay | Admitting: Family Medicine

## 2014-10-30 ENCOUNTER — Ambulatory Visit (INDEPENDENT_AMBULATORY_CARE_PROVIDER_SITE_OTHER): Payer: Medicare Other | Admitting: Family Medicine

## 2014-10-30 VITALS — BP 144/80 | HR 80 | Temp 98.0°F | Ht 70.0 in | Wt 266.5 lb

## 2014-10-30 DIAGNOSIS — E1121 Type 2 diabetes mellitus with diabetic nephropathy: Secondary | ICD-10-CM

## 2014-10-30 DIAGNOSIS — N183 Chronic kidney disease, stage 3 unspecified: Secondary | ICD-10-CM

## 2014-10-30 DIAGNOSIS — Z0000 Encounter for general adult medical examination without abnormal findings: Secondary | ICD-10-CM

## 2014-10-30 DIAGNOSIS — I10 Essential (primary) hypertension: Secondary | ICD-10-CM

## 2014-10-30 DIAGNOSIS — E785 Hyperlipidemia, unspecified: Secondary | ICD-10-CM

## 2014-10-30 DIAGNOSIS — Z7189 Other specified counseling: Secondary | ICD-10-CM | POA: Insufficient documentation

## 2014-10-30 DIAGNOSIS — E1140 Type 2 diabetes mellitus with diabetic neuropathy, unspecified: Secondary | ICD-10-CM

## 2014-10-30 DIAGNOSIS — N400 Enlarged prostate without lower urinary tract symptoms: Secondary | ICD-10-CM | POA: Insufficient documentation

## 2014-10-30 NOTE — Assessment & Plan Note (Addendum)
Advanced directive: would want wife to be HCPOA. Does not want prolonged life support. Packet provided today.

## 2014-10-30 NOTE — Assessment & Plan Note (Signed)
Mild, continue to monitor.  

## 2014-10-30 NOTE — Patient Instructions (Addendum)
Advanced directive packet provided today. Keep reading, keep brain active.  Good to see you today, call us with questions. Return in 6 months for diabetes follow up.

## 2014-10-30 NOTE — Assessment & Plan Note (Signed)

## 2014-10-30 NOTE — Progress Notes (Signed)
BP 144/80 mmHg  Pulse 80  Temp(Src) 98 F (36.7 C) (Oral)  Ht 5\' 10"  (1.778 m)  Wt 266 lb 8 oz (120.884 kg)  BMI 38.24 kg/m2   CC: medicare wellness visit  Subjective:    Patient ID: [name] Luna., male    DOB: March 07, 1941, 74 y.o.   MRN: [id number]  HPI: [name] Germer. is a 74 y.o. male presenting on 10/30/2014 for Annual Exam   H/o kidney stones - released by Dr Karsten Ro.  No falls this year.  Denies depression. No anhedonia. No sadness.  Vision screen - done 11/2013 at eye doctor.  Dental appt is next month. Hearing screen passed.   Preventative: Prostate - due for check. Always normal COLONOSCOPY Date: 12/2013 3 polyps, diverticulosis, rec rpt 3 yrs Ardis Hughs) Flu shot - 06/2014 Pneumovax 09/2011, prevnar 04/2014 Td 09/2011 Shingles shot - declines Advanced directive: would want wife to be HCPOA. Does not want prolonged life support. Packet provided today.  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  Diet: some water, fruits/vegetables daily, avoids potatoes   Relevant past medical, surgical, family and social history reviewed and updated as indicated. Interim medical history since our last visit reviewed. Allergies and medications reviewed and updated. Current Outpatient Prescriptions on File Prior to Visit  Medication Sig  . aspirin 325 MG tablet Take 325 mg by mouth daily.  . Cholecalciferol (VITAMIN D) 2000 UNITS CAPS Take 1 capsule by mouth daily.  . Choline Fenofibrate (FENOFIBRIC ACID) 135 MG CPDR TAKE ONE CAPSULE BY MOUTH DAILY  . Glucosamine-Chondroitin (OSTEO BI-FLEX REGULAR STRENGTH PO) Take 2 tablets by mouth daily.  Marland Kitchen lisinopril (PRINIVIL,ZESTRIL) 20 MG tablet TAKE 1 TABLET BY MOUTH DAILY  . metFORMIN (GLUCOPHAGE) 1000 MG tablet TAKE 1 TABLET BY MOUTH TWICE A DAY WITH A MEAL  . Multiple Vitamins-Minerals (MACULAR VITAMIN BENEFIT PO)  Take 1 tablet by mouth daily.  . Omega-3 Fatty Acids (FISH OIL) 1200 MG CAPS Take 1 capsule by mouth 3 (three) times daily.  . simvastatin (ZOCOR) 20 MG tablet TAKE ONE TABLET BY MOUTH EVERY NIGHT AT BEDTIME   No current facility-administered medications on file prior to visit.    Review of Systems Per HPI unless specifically indicated above     Objective:    BP 144/80 mmHg  Pulse 80  Temp(Src) 98 F (36.7 C) (Oral)  Ht 5\' 10"  (1.778 m)  Wt 266 lb 8 oz (120.884 kg)  BMI 38.24 kg/m2  Wt Readings from Last 3 Encounters:  10/30/14 266 lb 8 oz (120.884 kg)  04/28/14 278 lb (126.1 kg)  01/05/14 277 lb (125.646 kg)    Physical Exam  Constitutional: He is oriented to person, place, and time. He appears well-developed and well-nourished. No distress.  HENT:  Head: Normocephalic and atraumatic.  Right Ear: Hearing, tympanic membrane, external ear and ear canal normal.  Left Ear: Hearing, tympanic membrane, external ear and ear canal normal.  Nose: Nose normal.  Mouth/Throat: Uvula is midline, oropharynx is clear and moist and mucous membranes are normal. No oropharyngeal exudate, posterior oropharyngeal edema or posterior oropharyngeal erythema.  Eyes: Conjunctivae and EOM are normal. Pupils are equal, round, and reactive to light. No scleral icterus.  Neck: Normal range of motion. Neck supple. Carotid bruit is not present. No thyromegaly present.  Cardiovascular: Normal rate, regular rhythm, normal heart sounds and intact distal pulses.   No  murmur heard. Pulses:      Radial pulses are 2+ on the right side, and 2+ on the left side.  Pulmonary/Chest: Effort normal and breath sounds normal. No respiratory distress. He has no wheezes. He has no rales.  Abdominal: Soft. Bowel sounds are normal. He exhibits no distension and no mass. There is no tenderness. There is no rebound and no guarding.  Genitourinary: Rectum normal. Rectal exam shows no external hemorrhoid, no internal hemorrhoid,  no fissure, no mass, no tenderness and anal tone normal. Prostate is enlarged (25-30gm). Prostate is not tender.  Musculoskeletal: Normal range of motion. He exhibits no edema.  Lymphadenopathy:    He has no cervical adenopathy.  Neurological: He is alert and oriented to person, place, and time.  CN grossly intact, station and gait intact Recall 2/3, 2/3 with recall  Calculation 3/5 D-L-[id number]  Skin: Skin is warm and dry. No rash noted.  Psychiatric: He has a normal mood and affect. His behavior is normal. Judgment and thought content normal.  Nursing note and vitals reviewed.  Results for orders placed or performed in visit on 10/29/14  Lipid panel  Result Value Ref Range   Cholesterol 141 0 - 200 mg/dL   Triglycerides 303.0 (H) 0.0 - 149.0 mg/dL   HDL 29.40 (L) >39.00 mg/dL   VLDL 60.6 (H) 0.0 - 40.0 mg/dL   Total CHOL/HDL Ratio 5    NonHDL 111.60   Comprehensive metabolic panel  Result Value Ref Range   Sodium 141 135 - 145 mEq/L   Potassium 4.6 3.5 - 5.1 mEq/L   Chloride 105 96 - 112 mEq/L   CO2 29 19 - 32 mEq/L   Glucose, Bld 124 (H) 70 - 99 mg/dL   BUN 23 6 - 23 mg/dL   Creatinine, Ser 1.32 0.40 - 1.50 mg/dL   Total Bilirubin 0.8 0.2 - 1.2 mg/dL   Alkaline Phosphatase 32 (L) 39 - 117 U/L   AST 24 0 - 37 U/L   ALT 24 0 - 53 U/L   Total Protein 6.8 6.0 - 8.3 g/dL   Albumin 4.0 3.5 - 5.2 g/dL   Calcium 9.8 8.4 - 10.5 mg/dL   GFR 56.61 (L) >60.00 mL/min  Hemoglobin A1c  Result Value Ref Range   Hgb A1c MFr Bld 6.5 4.6 - 6.5 %  PSA, Medicare  Result Value Ref Range   PSA 0.67 0.10 - 4.00 ng/ml  CBC with Differential/Platelet  Result Value Ref Range   WBC 7.6 4.0 - 10.5 K/uL   RBC 4.53 4.22 - 5.81 Mil/uL   Hemoglobin 13.5 13.0 - 17.0 g/dL   HCT 40.2 39.0 - 52.0 %   MCV 88.8 78.0 - 100.0 fl   MCHC 33.6 30.0 - 36.0 g/dL   RDW 14.6 11.5 - 15.5 %   Platelets 212.0 150.0 - 400.0 K/uL   Neutrophils Relative % 58.4 43.0 - 77.0 %   Lymphocytes Relative 32.3 12.0 - 46.0 %    Monocytes Relative 6.3 3.0 - 12.0 %   Eosinophils Relative 2.7 0.0 - 5.0 %   Basophils Relative 0.3 0.0 - 3.0 %   Neutro Abs 4.4 1.4 - 7.7 K/uL   Lymphs Abs 2.5 0.7 - 4.0 K/uL   Monocytes Absolute 0.5 0.1 - 1.0 K/uL   Eosinophils Absolute 0.2 0.0 - 0.7 K/uL   Basophils Absolute 0.0 0.0 - 0.1 K/uL  Vit D  25 hydroxy (rtn osteoporosis monitoring)  Result Value Ref Range   VITD 49.31 30.00 -  100.00 ng/mL  LDL cholesterol, direct  Result Value Ref Range   Direct LDL 58.0 mg/dL      Assessment & Plan:   Problem List Items Addressed This Visit    Type 2 diabetes, controlled, with neuropathy    Chronic, stable. Continue metformin. Foot exam next visit.      Medicare annual wellness visit, subsequent - Primary    I have personally reviewed the Medicare Annual Wellness questionnaire and have noted 1. The patient's medical and social history 2. Their use of alcohol, tobacco or illicit drugs 3. Their current medications and supplements 4. The patient's functional ability including ADL's, fall risks, home safety risks and hearing or visual impairment. 5. Diet and physical activity 6. Evidence for depression or mood disorders The patients weight, height, BMI have been recorded in the chart.  Hearing and vision has been addressed. I have made referrals, counseling and provided education to the patient based review of the above and I have provided the pt with a written personalized care plan for preventive services. Provider list updated - see scanned questionairre. Reviewed preventative protocols and updated unless pt declined.      HTN (hypertension)    Mildly elevated today - continue to monitor.      HLD (hyperlipidemia)    Elevated today - pt endorses dietary indiscretions (home made sausage recently). Will recheck in 6 mo.      Diabetic nephropathy    Mild, continue to monitor.      Chronic kidney disease, stage III (moderate)    Mild, continue to monitor.      BPH  (benign prostatic hypertrophy)    Enlarged on DRE, as well as endorsement of mild BPH sxs. Not at point of desiring treatment however. Continue to monitor. PSA always benign, reassuring.      Advanced care planning/counseling discussion    Advanced directive: would want wife to be HCPOA. Does not want prolonged life support. Packet provided today.          Follow up plan: Return in about 6 months (around 04/30/2015), or as needed, for follow up visit.

## 2014-10-30 NOTE — Assessment & Plan Note (Signed)
Elevated today - pt endorses dietary indiscretions (home made sausage recently). Will recheck in 6 mo.

## 2014-10-30 NOTE — Assessment & Plan Note (Signed)
Mildly elevated today - continue to monitor.

## 2014-10-30 NOTE — Progress Notes (Signed)
Pre visit review using our clinic review tool, if applicable. No additional management support is needed unless otherwise documented below in the visit note. 

## 2014-10-30 NOTE — Assessment & Plan Note (Signed)
Enlarged on DRE, as well as endorsement of mild BPH sxs. Not at point of desiring treatment however. Continue to monitor. PSA always benign, reassuring.

## 2014-10-30 NOTE — Assessment & Plan Note (Signed)
Chronic, stable. Continue metformin. Foot exam next visit.

## 2014-10-31 ENCOUNTER — Telehealth: Payer: Self-pay | Admitting: Family Medicine

## 2014-10-31 NOTE — Telephone Encounter (Signed)
emmi mailed  °

## 2014-11-01 ENCOUNTER — Other Ambulatory Visit: Payer: Self-pay | Admitting: Family Medicine

## 2015-01-21 DIAGNOSIS — E119 Type 2 diabetes mellitus without complications: Secondary | ICD-10-CM | POA: Diagnosis not present

## 2015-01-21 DIAGNOSIS — H3531 Nonexudative age-related macular degeneration: Secondary | ICD-10-CM | POA: Diagnosis not present

## 2015-01-21 DIAGNOSIS — H26492 Other secondary cataract, left eye: Secondary | ICD-10-CM | POA: Diagnosis not present

## 2015-01-21 LAB — HM DIABETES EYE EXAM

## 2015-01-22 ENCOUNTER — Encounter: Payer: Self-pay | Admitting: Family Medicine

## 2015-02-01 DIAGNOSIS — H0012 Chalazion right lower eyelid: Secondary | ICD-10-CM | POA: Diagnosis not present

## 2015-02-01 DIAGNOSIS — H0015 Chalazion left lower eyelid: Secondary | ICD-10-CM | POA: Diagnosis not present

## 2015-02-16 ENCOUNTER — Other Ambulatory Visit: Payer: Self-pay | Admitting: Family Medicine

## 2015-02-18 DIAGNOSIS — H01001 Unspecified blepharitis right upper eyelid: Secondary | ICD-10-CM | POA: Diagnosis not present

## 2015-02-18 DIAGNOSIS — H0012 Chalazion right lower eyelid: Secondary | ICD-10-CM | POA: Diagnosis not present

## 2015-02-18 DIAGNOSIS — H01005 Unspecified blepharitis left lower eyelid: Secondary | ICD-10-CM | POA: Diagnosis not present

## 2015-04-21 ENCOUNTER — Other Ambulatory Visit: Payer: Self-pay | Admitting: Family Medicine

## 2015-04-21 DIAGNOSIS — E785 Hyperlipidemia, unspecified: Secondary | ICD-10-CM

## 2015-04-21 DIAGNOSIS — E1140 Type 2 diabetes mellitus with diabetic neuropathy, unspecified: Secondary | ICD-10-CM

## 2015-04-23 ENCOUNTER — Other Ambulatory Visit (INDEPENDENT_AMBULATORY_CARE_PROVIDER_SITE_OTHER): Payer: Medicare Other

## 2015-04-23 DIAGNOSIS — E785 Hyperlipidemia, unspecified: Secondary | ICD-10-CM

## 2015-04-23 DIAGNOSIS — E1140 Type 2 diabetes mellitus with diabetic neuropathy, unspecified: Secondary | ICD-10-CM

## 2015-04-23 LAB — HEMOGLOBIN A1C: Hgb A1c MFr Bld: 6.6 % — ABNORMAL HIGH (ref 4.6–6.5)

## 2015-04-23 LAB — BASIC METABOLIC PANEL
BUN: 23 mg/dL (ref 6–23)
CALCIUM: 9.6 mg/dL (ref 8.4–10.5)
CO2: 27 meq/L (ref 19–32)
Chloride: 104 mEq/L (ref 96–112)
Creatinine, Ser: 1.46 mg/dL (ref 0.40–1.50)
GFR: 50.33 mL/min — ABNORMAL LOW (ref >60.00)
Glucose, Bld: 128 mg/dL — ABNORMAL HIGH (ref 70–99)
Potassium: 4.1 mEq/L (ref 3.5–5.1)
Sodium: 139 mEq/L (ref 135–145)

## 2015-04-23 LAB — LIPID PANEL
CHOLESTEROL: 144 mg/dL (ref 0–200)
HDL: 27.3 mg/dL — ABNORMAL LOW (ref >39.00)
NonHDL: 116.46
Total CHOL/HDL Ratio: 5
Triglycerides: 342 mg/dL — ABNORMAL HIGH (ref 0.0–149.0)
VLDL: 68.4 mg/dL — ABNORMAL HIGH (ref 0.0–40.0)

## 2015-04-23 LAB — LDL CHOLESTEROL, DIRECT: LDL DIRECT: 57 mg/dL

## 2015-04-30 ENCOUNTER — Ambulatory Visit (INDEPENDENT_AMBULATORY_CARE_PROVIDER_SITE_OTHER): Payer: Medicare Other | Admitting: Family Medicine

## 2015-04-30 ENCOUNTER — Encounter: Payer: Self-pay | Admitting: Family Medicine

## 2015-04-30 VITALS — BP 132/76 | HR 73 | Temp 98.0°F | Wt 281.2 lb

## 2015-04-30 DIAGNOSIS — S46811A Strain of other muscles, fascia and tendons at shoulder and upper arm level, right arm, initial encounter: Secondary | ICD-10-CM

## 2015-04-30 DIAGNOSIS — N183 Chronic kidney disease, stage 3 unspecified: Secondary | ICD-10-CM

## 2015-04-30 DIAGNOSIS — E1140 Type 2 diabetes mellitus with diabetic neuropathy, unspecified: Secondary | ICD-10-CM | POA: Diagnosis not present

## 2015-04-30 DIAGNOSIS — E785 Hyperlipidemia, unspecified: Secondary | ICD-10-CM

## 2015-04-30 DIAGNOSIS — I10 Essential (primary) hypertension: Secondary | ICD-10-CM

## 2015-04-30 DIAGNOSIS — S46819A Strain of other muscles, fascia and tendons at shoulder and upper arm level, unspecified arm, initial encounter: Secondary | ICD-10-CM | POA: Insufficient documentation

## 2015-04-30 MED ORDER — METHOCARBAMOL 500 MG PO TABS: 500.0000 mg | ORAL_TABLET | Freq: Three times a day (TID) | ORAL | Status: DC | PRN

## 2015-04-30 MED ORDER — FENOFIBRATE 145 MG PO TABS: 145.0000 mg | ORAL_TABLET | Freq: Every day | ORAL | Status: DC

## 2015-04-30 NOTE — Patient Instructions (Addendum)
Stop 135mg  fenofibrate, start 145mg  dose. New dose sent to pharmacy. Continue metformin and other medicines. For right shoulder - I think you strained your trapezius muscle. Treat with tylenol as needed, continue heating pad, may try muscle relaxant provided today. We will recheck levels in 6 months. Return in 6 months for medicare wellness visit

## 2015-04-30 NOTE — Assessment & Plan Note (Signed)
Chronic, stable. Continue to monitor. Goal <130/80 given CKD

## 2015-04-30 NOTE — Assessment & Plan Note (Signed)
Trig elevated - change fibrate to 145mg  daily. Continue fish oil TID and simvastatin 20mg  nightly.

## 2015-04-30 NOTE — Assessment & Plan Note (Signed)
Discussed. No evidence of shoulder or neck pathology otherwise. rec heating pad, tylenol, robaxin. Discussed sedation precautions with tramadol. Provided with stretching exercises for cervical strain from [facility] pt advisor.

## 2015-04-30 NOTE — Progress Notes (Signed)
Pre visit review using our clinic review tool, if applicable. No additional management support is needed unless otherwise documented below in the visit note. 

## 2015-04-30 NOTE — Progress Notes (Signed)
BP 132/76 mmHg  Pulse 73  Temp(Src) 98 F (36.7 C) (Oral)  Wt 281 lb 4 oz (127.574 kg)  SpO2 98%   CC: 6 mo f/u visit  Subjective:    Patient ID: [name] Luna., male    DOB: April 05, 1941, 74 y.o.   MRN: [id number]  HPI: [name] Pezzullo. is a 74 y.o. male presenting on 04/30/2015 for Follow-up and Shoulder Pain   R shoulder pain - ongoing for last 2 months. Denies inciting trayuma/injury. No neck pain. No radiation down arm, numbness or tingling down arm. Tried nothing for this.   DM - requests DM shoe prescription. Regularly does not check sugars. Compliant with antihyperglycemic regimen which includes: metformin 1000mg  bid.  Denies low sugars or hypoglycemic symptoms.  Denies paresthesias. Last diabetic eye exam 01/2015.  Pneumovax: 2013.  Prevnar: 2015. Lab Results  Component Value Date   HGBA1C 6.6* 04/23/2015   Diabetic Foot Exam - Simple   Simple Foot Form  Diabetic Foot exam was performed with the following findings:  Yes 04/30/2015  9:22 AM  Visual Inspection  No deformities, no ulcerations, no other skin breakdown bilaterally:  Yes  Sensation Testing  See comments:  Yes  Pulse Check  See comments:  Yes  Comments  Diminished pulses bilaterally Decreased testing to monofilament R>L       HLD - uncontrolled despite fenofibrate 135mg  daily and fish oil 1200mg  dialy and zocor 20mg  nightly. Trig elevated to 300s. Attributes to bbq meal twice last week.  HTN - stable on lisinopril 20mg  daily.  Relevant past medical, surgical, family and social history reviewed and updated as indicated. Interim medical history since our last visit reviewed. Allergies and medications reviewed and updated. Current Outpatient Prescriptions on File Prior to Visit  Medication Sig  . aspirin 325 MG tablet Take 325 mg by mouth daily.  . Cholecalciferol (VITAMIN D) 2000 UNITS CAPS Take 1 capsule by mouth daily.  . Glucosamine-Chondroitin (OSTEO BI-FLEX REGULAR STRENGTH PO) Take 2  tablets by mouth daily.  Marland Kitchen lisinopril (PRINIVIL,ZESTRIL) 20 MG tablet TAKE 1 TABLET BY MOUTH DAILY  . metFORMIN (GLUCOPHAGE) 1000 MG tablet TAKE 1 TABLET BY MOUTH TWICE A DAY WITH A MEAL  . Multiple Vitamins-Minerals (MACULAR VITAMIN BENEFIT PO) Take 1 tablet by mouth daily.  . Omega-3 Fatty Acids (FISH OIL) 1200 MG CAPS Take 1 capsule by mouth 3 (three) times daily.  . simvastatin (ZOCOR) 20 MG tablet TAKE ONE TABLET BY MOUTH EVERY NIGHT AT BEDTIME   No current facility-administered medications on file prior to visit.    Review of Systems Per HPI unless specifically indicated above     Objective:    BP 132/76 mmHg  Pulse 73  Temp(Src) 98 F (36.7 C) (Oral)  Wt 281 lb 4 oz (127.574 kg)  SpO2 98%  Wt Readings from Last 3 Encounters:  04/30/15 281 lb 4 oz (127.574 kg)  10/30/14 266 lb 8 oz (120.884 kg)  04/28/14 278 lb (126.1 kg)    Physical Exam  Constitutional: He appears well-developed and well-nourished. No distress.  HENT:  Head: Normocephalic and atraumatic.  Right Ear: External ear normal.  Left Ear: External ear normal.  Nose: Nose normal.  Mouth/Throat: Oropharynx is clear and moist. No oropharyngeal exudate.  Eyes: Conjunctivae and EOM are normal. Pupils are equal, round, and reactive to light. No scleral icterus.  Neck: Normal range of motion. Neck supple.  Cardiovascular: Normal rate, regular rhythm, normal heart sounds and intact distal pulses.  No murmur heard. Pulmonary/Chest: Effort normal and breath sounds normal. No respiratory distress. He has no wheezes. He has no rales.  Musculoskeletal: He exhibits no edema.  See HPI for foot exam if done No midline cervical neck pain, FROM at neck. No shoulder pain, FROM shoulders bilaterally Tender to palpation R trapezius belly  Lymphadenopathy:    He has no cervical adenopathy.  Skin: Skin is warm and dry. No rash noted.  Psychiatric: He has a normal mood and affect.  Nursing note and vitals  reviewed.  Results for orders placed or performed in visit on 04/23/15  Lipid panel  Result Value Ref Range   Cholesterol 144 0 - 200 mg/dL   Triglycerides 342.0 (H) 0.0 - 149.0 mg/dL   HDL 27.30 (L) >39.00 mg/dL   VLDL 68.4 (H) 0.0 - 40.0 mg/dL   Total CHOL/HDL Ratio 5    NonHDL 116.46   Hemoglobin A1c  Result Value Ref Range   Hgb A1c MFr Bld 6.6 (H) 4.6 - 6.5 %  Basic metabolic panel  Result Value Ref Range   Sodium 139 135 - 145 mEq/L   Potassium 4.1 3.5 - 5.1 mEq/L   Chloride 104 96 - 112 mEq/L   CO2 27 19 - 32 mEq/L   Glucose, Bld 128 (H) 70 - 99 mg/dL   BUN 23 6 - 23 mg/dL   Creatinine, Ser 1.46 0.40 - 1.50 mg/dL   Calcium 9.6 8.4 - 10.5 mg/dL   GFR 50.33 (L) >60.00 mL/min  LDL cholesterol, direct  Result Value Ref Range   Direct LDL 57.0 mg/dL      Assessment & Plan:   Problem List Items Addressed This Visit    Type 2 diabetes, controlled, with neuropathy    Rx for diabetic shoes provided today. Chronic, stable. Good control. Continue metformin.      Relevant Medications   methocarbamol (ROBAXIN) 500 MG tablet   HTN (hypertension)    Chronic, stable. Continue to monitor. Goal <130/80 given CKD      Relevant Medications   fenofibrate (TRICOR) 145 MG tablet   HLD (hyperlipidemia)    Trig elevated - change fibrate to 145mg  daily. Continue fish oil TID and simvastatin 20mg  nightly.       Relevant Medications   fenofibrate (TRICOR) 145 MG tablet   Chronic kidney disease, stage III (moderate)    Discussed with patient. Continue to monitor.      Trapezius muscle strain - Primary    Discussed. No evidence of shoulder or neck pathology otherwise. rec heating pad, tylenol, robaxin. Discussed sedation precautions with tramadol. Provided with stretching exercises for cervical strain from [facility] pt advisor.          Follow up plan: Return in about 6 months (around 10/31/2015), or as needed, for medicare wellness visit.

## 2015-04-30 NOTE — Assessment & Plan Note (Signed)
Discussed with patient. Continue to monitor.

## 2015-04-30 NOTE — Assessment & Plan Note (Signed)
Rx for diabetic shoes provided today. Chronic, stable. Good control. Continue metformin.

## 2015-06-17 DIAGNOSIS — Z23 Encounter for immunization: Secondary | ICD-10-CM | POA: Diagnosis not present

## 2015-08-16 ENCOUNTER — Encounter: Payer: Self-pay | Admitting: Primary Care

## 2015-08-16 ENCOUNTER — Ambulatory Visit (INDEPENDENT_AMBULATORY_CARE_PROVIDER_SITE_OTHER): Payer: Medicare Other | Admitting: Primary Care

## 2015-08-16 VITALS — BP 144/84 | HR 68 | Temp 98.0°F | Ht 70.0 in | Wt 276.0 lb

## 2015-08-16 DIAGNOSIS — W57XXXA Bitten or stung by nonvenomous insect and other nonvenomous arthropods, initial encounter: Secondary | ICD-10-CM

## 2015-08-16 DIAGNOSIS — T148 Other injury of unspecified body region: Secondary | ICD-10-CM | POA: Diagnosis not present

## 2015-08-16 NOTE — Patient Instructions (Signed)
Keep the site clean and apply the triple antibiotic ointment twice daily.  It was a pleasure meeting you!

## 2015-08-16 NOTE — Progress Notes (Signed)
Pre visit review using our clinic review tool, if applicable. No additional management support is needed unless otherwise documented below in the visit note. 

## 2015-08-16 NOTE — Progress Notes (Signed)
Subjective:    Patient ID: [name] Luna., male    DOB: June 27, 1941, 74 y.o.   MRN: [id number]  HPI  [name]. Is a 74 year old male woh presents today with a chief complaint of tick bite. His bite is located to the left side of his neck and he noticed it this weekend. He believes the tick may had been attached since 1 week ago while bear hunting. His wife pulled off the tick yesterday, but he believes that part of the tick remains. Denies pain, rashes, headaches, fatigue.   Review of Systems  Constitutional: Negative for fever and fatigue.  Respiratory: Negative for shortness of breath.   Cardiovascular: Negative for chest pain.  Skin: Negative for rash.       Mild erythema  Neurological: Negative for weakness and headaches.       Past Medical History  Diagnosis Date  . T2DM (type 2 diabetes mellitus) ([place]) 2010  . GERD (gastroesophageal reflux disease)   . HTN (hypertension)   . HLD (hyperlipidemia)   . History of kidney stones     ca ox [name] @ [facility]) now [place]  . History of cholelithiasis   . History of pneumonia   . Chronic kidney disease     kidney stones    Social History   Social History  . Marital Status: Married    Spouse Name: N/A  . Number of Children: N/A  . Years of Education: N/A   Occupational History  . Not on file.   Social History Main Topics  . Smoking status: Never Smoker   . Smokeless tobacco: Former Systems developer    Types: [place] date: 09/08/2001  . Alcohol Use: No  . Drug Use: No  . Sexual Activity: Not on file   Other Topics Concern  . 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   Diet: some water, fruits/vegetables daily, avoids potatoes    Past Surgical History  Procedure Laterality Date  . Umbilical hernia repair      with mesh  . Knee cartilage surgery Left     . Carpal tunnel release Bilateral   . Cataract extraction, bilateral    . Colonoscopy  11/2012    11 adenomatous polyps, diverticulosis, rec rpt 1 yr Ardis Hughs)  . Lithotripsy    . Colonoscopy  12/2013    3 polyps, diverticulosis, rec rpt 3 yrs Ardis Hughs)    Family History  Problem Relation Age of Onset  . Alzheimer's disease Mother   . Breast cancer Mother     breast  . Stroke Father 3  . Diabetes Father   . Rectal cancer Maternal Grandfather     rectal  . Colon cancer Maternal Grandfather 61  . CAD Neg Hx   . Esophageal cancer Neg Hx   . Stomach cancer Neg Hx     No Known Allergies  Current Outpatient Prescriptions on File Prior to Visit  Medication Sig Dispense Refill  . aspirin 325 MG tablet Take 325 mg by mouth daily.    . Cholecalciferol (VITAMIN D) 2000 UNITS CAPS Take 1 capsule by mouth daily.    . fenofibrate (TRICOR) 145 MG tablet Take 1 tablet (145 mg total) by mouth daily. 30 tablet 11  . Glucosamine-Chondroitin (OSTEO BI-FLEX REGULAR STRENGTH PO) Take 2  tablets by mouth daily.    Marland Kitchen lisinopril (PRINIVIL,ZESTRIL) 20 MG tablet TAKE 1 TABLET BY MOUTH DAILY 30 tablet 6  . metFORMIN (GLUCOPHAGE) 1000 MG tablet TAKE 1 TABLET BY MOUTH TWICE A DAY WITH A MEAL 180 tablet 3  . methocarbamol (ROBAXIN) 500 MG tablet Take 1 tablet (500 mg total) by mouth 3 (three) times daily as needed for muscle spasms. 30 tablet 0  . Multiple Vitamins-Minerals (MACULAR VITAMIN BENEFIT PO) Take 1 tablet by mouth daily.    . Omega-3 Fatty Acids (FISH OIL) 1200 MG CAPS Take 1 capsule by mouth 3 (three) times daily.    . simvastatin (ZOCOR) 20 MG tablet TAKE ONE TABLET BY MOUTH EVERY NIGHT AT BEDTIME 90 tablet 3   No current facility-administered medications on file prior to visit.    BP 144/84 mmHg  Pulse 68  Temp(Src) 98 F (36.7 C) (Oral)  Ht 5\' 10"  (1.778 m)  Wt 276 lb (125.193 kg)  BMI 39.60 kg/m2  SpO2 98%    Objective:   Physical Exam  Constitutional: He appears well-nourished.   Cardiovascular: Normal rate and regular rhythm.   Pulmonary/Chest: Effort normal and breath sounds normal.  Skin:  Mild erythema to base of lateral neck. Localized. Small fragment of tick noted.          Assessment & Plan:  Tick removal:  Tick to base of left lateral neck removed by wife last night, some fragments remain. Denies fatigue, body aches, headaches, fevers. Mild erythema and small fragment of tick to base of left lateral neck.  Cleaned site with betadine. Applied pain ease. Used 24 gauge needle and tweezers to extract remaining fragment of tick head. Cleaned site again with alcohol and betadine. Triple antibiotic ointment applied and provided to patient.  Directions for home use provided. Does not appear infected, discussed return precautions.

## 2015-09-27 ENCOUNTER — Other Ambulatory Visit: Payer: Self-pay | Admitting: Family Medicine

## 2015-10-17 ENCOUNTER — Other Ambulatory Visit: Payer: Self-pay | Admitting: Family Medicine

## 2015-10-31 ENCOUNTER — Other Ambulatory Visit: Payer: Self-pay | Admitting: Family Medicine

## 2015-10-31 DIAGNOSIS — I10 Essential (primary) hypertension: Secondary | ICD-10-CM

## 2015-10-31 DIAGNOSIS — N400 Enlarged prostate without lower urinary tract symptoms: Secondary | ICD-10-CM

## 2015-10-31 DIAGNOSIS — E785 Hyperlipidemia, unspecified: Secondary | ICD-10-CM

## 2015-10-31 DIAGNOSIS — E1140 Type 2 diabetes mellitus with diabetic neuropathy, unspecified: Secondary | ICD-10-CM

## 2015-10-31 DIAGNOSIS — N183 Chronic kidney disease, stage 3 unspecified: Secondary | ICD-10-CM

## 2015-11-01 ENCOUNTER — Other Ambulatory Visit (INDEPENDENT_AMBULATORY_CARE_PROVIDER_SITE_OTHER): Payer: Medicare Other

## 2015-11-01 DIAGNOSIS — E785 Hyperlipidemia, unspecified: Secondary | ICD-10-CM

## 2015-11-01 DIAGNOSIS — N400 Enlarged prostate without lower urinary tract symptoms: Secondary | ICD-10-CM | POA: Diagnosis not present

## 2015-11-01 DIAGNOSIS — N183 Chronic kidney disease, stage 3 unspecified: Secondary | ICD-10-CM

## 2015-11-01 DIAGNOSIS — E1140 Type 2 diabetes mellitus with diabetic neuropathy, unspecified: Secondary | ICD-10-CM

## 2015-11-01 LAB — RENAL FUNCTION PANEL
Albumin: 4.1 g/dL (ref 3.5–5.2)
BUN: 24 mg/dL — AB (ref 6–23)
CHLORIDE: 106 meq/L (ref 96–112)
CO2: 27 mEq/L (ref 19–32)
Calcium: 9.6 mg/dL (ref 8.4–10.5)
Creatinine, Ser: 1.32 mg/dL (ref 0.40–1.50)
GFR: 56.45 mL/min — ABNORMAL LOW (ref >60.00)
Glucose, Bld: 126 mg/dL — ABNORMAL HIGH (ref 70–99)
PHOSPHORUS: 3.3 mg/dL (ref 2.3–4.6)
POTASSIUM: 4.8 meq/L (ref 3.5–5.1)
SODIUM: 143 meq/L (ref 135–145)

## 2015-11-01 LAB — HEMOGLOBIN A1C: Hgb A1c MFr Bld: 6.5 % (ref 4.6–6.5)

## 2015-11-01 LAB — LIPID PANEL
CHOLESTEROL: 146 mg/dL (ref 0–200)
HDL: 30.8 mg/dL — ABNORMAL LOW (ref >39.00)
NonHDL: 115.66
TRIGLYCERIDES: 279 mg/dL — AB (ref 0.0–149.0)
Total CHOL/HDL Ratio: 5
VLDL: 55.8 mg/dL — ABNORMAL HIGH (ref 0.0–40.0)

## 2015-11-01 LAB — PSA: PSA: 0.77 ng/mL (ref 0.10–4.00)

## 2015-11-01 LAB — LDL CHOLESTEROL, DIRECT: LDL DIRECT: 70 mg/dL

## 2015-11-03 ENCOUNTER — Ambulatory Visit (INDEPENDENT_AMBULATORY_CARE_PROVIDER_SITE_OTHER): Payer: Medicare Other | Admitting: Family Medicine

## 2015-11-03 ENCOUNTER — Encounter: Payer: Self-pay | Admitting: Family Medicine

## 2015-11-03 ENCOUNTER — Other Ambulatory Visit: Payer: Self-pay | Admitting: *Deleted

## 2015-11-03 VITALS — BP 148/70 | HR 80 | Temp 97.6°F | Ht 70.0 in | Wt 272.5 lb

## 2015-11-03 DIAGNOSIS — S90861A Insect bite (nonvenomous), right foot, initial encounter: Secondary | ICD-10-CM | POA: Insufficient documentation

## 2015-11-03 DIAGNOSIS — N400 Enlarged prostate without lower urinary tract symptoms: Secondary | ICD-10-CM | POA: Diagnosis not present

## 2015-11-03 DIAGNOSIS — I10 Essential (primary) hypertension: Secondary | ICD-10-CM

## 2015-11-03 DIAGNOSIS — N183 Chronic kidney disease, stage 3 unspecified: Secondary | ICD-10-CM

## 2015-11-03 DIAGNOSIS — S30860A Insect bite (nonvenomous) of lower back and pelvis, initial encounter: Secondary | ICD-10-CM

## 2015-11-03 DIAGNOSIS — E785 Hyperlipidemia, unspecified: Secondary | ICD-10-CM

## 2015-11-03 DIAGNOSIS — E1140 Type 2 diabetes mellitus with diabetic neuropathy, unspecified: Secondary | ICD-10-CM

## 2015-11-03 DIAGNOSIS — S46811A Strain of other muscles, fascia and tendons at shoulder and upper arm level, right arm, initial encounter: Secondary | ICD-10-CM

## 2015-11-03 DIAGNOSIS — Z0000 Encounter for general adult medical examination without abnormal findings: Secondary | ICD-10-CM | POA: Diagnosis not present

## 2015-11-03 DIAGNOSIS — W57XXXA Bitten or stung by nonvenomous insect and other nonvenomous arthropods, initial encounter: Secondary | ICD-10-CM

## 2015-11-03 DIAGNOSIS — E1122 Type 2 diabetes mellitus with diabetic chronic kidney disease: Secondary | ICD-10-CM | POA: Diagnosis not present

## 2015-11-03 DIAGNOSIS — Z7189 Other specified counseling: Secondary | ICD-10-CM

## 2015-11-03 NOTE — Telephone Encounter (Signed)
Patient's wife left voice mail requesting prescription refill.  Per 2/22 ov note, patient was to begin taking but it was not filled.

## 2015-11-03 NOTE — Patient Instructions (Addendum)
Try methocarbamol (trobaxin) muscle relaxant for trapezius muscle strain. Watch for developing fever, new rash around tick bite or elsewhere, abdominal pain, headache or joint pains over next 10 days. If this happens, let us know right away for antibiotic. Stay active, watch greasy fatty foods to help control cholesterol. Work on getting living will completed and bring me copy when done. Return in 6 months for diabetes follow up  Health Maintenance, Male A healthy lifestyle and preventative care can promote health and wellness.  Maintain regular health, dental, and eye exams.  Eat a healthy diet. Foods like vegetables, fruits, whole grains, low-fat dairy products, and lean protein foods contain the nutrients you need and are low in calories. Decrease your intake of foods high in solid fats, added sugars, and salt. Get information about a proper diet from your health care provider, if necessary.  Regular physical exercise is one of the most important things you can do for your health. Most adults should get at least 150 minutes of moderate-intensity exercise (any activity that increases your heart rate and causes you to sweat) each week. In addition, most adults need muscle-strengthening exercises on 2 or more days a week.   Maintain a healthy weight. The body mass index (BMI) is a screening tool to identify possible weight problems. It provides an estimate of body fat based on height and weight. Your health care provider can find your BMI and can help you achieve or maintain a healthy weight. For males 20 years and older:  A BMI below 18.5 is considered underweight.  A BMI of 18.5 to 24.9 is normal.  A BMI of 25 to 29.9 is considered overweight.  A BMI of 30 and above is considered obese.  Maintain normal blood lipids and cholesterol by exercising and minimizing your intake of saturated fat. Eat a balanced diet with plenty of fruits and vegetables. Blood tests for lipids and cholesterol  should begin at age 3 and be repeated every 5 years. If your lipid or cholesterol levels are high, you are over age 2, or you are at high risk for heart disease, you may need your cholesterol levels checked more frequently.Ongoing high lipid and cholesterol levels should be treated with medicines if diet and exercise are not working.  If you smoke, find out from your health care provider how to quit. If you do not use tobacco, do not start.  Lung cancer screening is recommended for adults aged 55-80 years who are at high risk for developing lung cancer because of a history of smoking. A yearly low-dose CT scan of the lungs is recommended for people who have at least a 30-pack-year history of smoking and are current smokers or have quit within the past 15 years. A pack year of smoking is smoking an average of 1 pack of cigarettes a day for 1 year (for example, a 30-pack-year history of smoking could mean smoking 1 pack a day for 30 years or 2 packs a day for 15 years). Yearly screening should continue until the smoker has stopped smoking for at least 15 years. Yearly screening should be stopped for people who develop a health problem that would prevent them from having lung cancer treatment.  If you choose to drink alcohol, do not have more than 2 drinks per day. One drink is considered to be 12 oz (360 mL) of beer, 5 oz (150 mL) of wine, or 1.5 oz (45 mL) of liquor.  Avoid the use of street drugs. Do not  share needles with anyone. Ask for help if you need support or instructions about stopping the use of drugs.  High blood pressure causes heart disease and increases the risk of stroke. High blood pressure is more likely to develop in:  People who have blood pressure in the end of the normal range (100-139/85-89 mm Hg).  People who are overweight or obese.  People who are African American.  If you are 76-18 years of age, have your blood pressure checked every 3-5 years. If you are 67 years of age  or older, have your blood pressure checked every year. You should have your blood pressure measured twice--once when you are at a hospital or clinic, and once when you are not at a hospital or clinic. Record the average of the two measurements. To check your blood pressure when you are not at a hospital or clinic, you can use:  An automated blood pressure machine at a pharmacy.  A home blood pressure monitor.  If you are 61-16 years old, ask your health care provider if you should take aspirin to prevent heart disease.  Diabetes screening involves taking a blood sample to check your fasting blood sugar level. This should be done once every 3 years after age 41 if you are at a normal weight and without risk factors for diabetes. Testing should be considered at a younger age or be carried out more frequently if you are overweight and have at least 1 risk factor for diabetes.  Colorectal cancer can be detected and often prevented. Most routine colorectal cancer screening begins at the age of 69 and continues through age 25. However, your health care provider may recommend screening at an earlier age if you have risk factors for colon cancer. On a yearly basis, your health care provider may provide home test kits to check for hidden blood in the stool. A small camera at the end of a tube may be used to directly examine the colon (sigmoidoscopy or colonoscopy) to detect the earliest forms of colorectal cancer. Talk to your health care provider about this at age 23 when routine screening begins. A direct exam of the colon should be repeated every 5-10 years through age 84, unless early forms of precancerous polyps or small growths are found.  People who are at an increased risk for hepatitis B should be screened for this virus. You are considered at high risk for hepatitis B if:  You were born in a country where hepatitis B occurs often. Talk with your health care provider about which countries are  considered high risk.  Your parents were born in a high-risk country and you have not received a shot to protect against hepatitis B (hepatitis B vaccine).  You have HIV or AIDS.  You use needles to inject street drugs.  You live with, or have sex with, someone who has hepatitis B.  You are a man who has sex with other men (MSM).  You get hemodialysis treatment.  You take certain medicines for conditions like cancer, organ transplantation, and autoimmune conditions.  Hepatitis C blood testing is recommended for all people born from [birth year] through 1964 and any individual with known risk factors for hepatitis C.  Healthy men should no longer receive prostate-specific antigen (PSA) blood tests as part of routine cancer screening. Talk to your health care provider about prostate cancer screening.  Testicular cancer screening is not recommended for adolescents or adult males who have no symptoms. Screening includes self-exam, a health  care provider exam, and other screening tests. Consult with your health care provider about any symptoms you have or any concerns you have about testicular cancer.  Practice safe sex. Use condoms and avoid high-risk sexual practices to reduce the spread of sexually transmitted infections (STIs).  You should be screened for STIs, including gonorrhea and chlamydia if:  You are sexually active and are younger than 24 years.  You are older than 24 years, and your health care provider tells you that you are at risk for this type of infection.  Your sexual activity has changed since you were last screened, and you are at an increased risk for chlamydia or gonorrhea. Ask your health care provider if you are at risk.  If you are at risk of being infected with HIV, it is recommended that you take a prescription medicine daily to prevent HIV infection. This is called pre-exposure prophylaxis (PrEP). You are considered at risk if:  You are a man who has sex with other  men (MSM).  You are a heterosexual man who is sexually active with multiple partners.  You take drugs by injection.  You are sexually active with a partner who has HIV.  Talk with your health care provider about whether you are at high risk of being infected with HIV. If you choose to begin PrEP, you should first be tested for HIV. You should then be tested every 3 months for as long as you are taking PrEP.  Use sunscreen. Apply sunscreen liberally and repeatedly throughout the day. You should seek shade when your shadow is shorter than you. Protect yourself by wearing long sleeves, pants, a wide-brimmed hat, and sunglasses year round whenever you are outdoors.  Tell your health care provider of new moles or changes in moles, especially if there is a change in shape or color. Also, tell your health care provider if a mole is larger than the size of a pencil eraser.  A one-time screening for abdominal aortic aneurysm (AAA) and surgical repair of large AAAs by ultrasound is recommended for men aged 82-75 years who are current or former smokers.  Stay current with your vaccines (immunizations).   This information is not intended to replace advice given to you by your health care provider. Make sure you discuss any questions you have with your health care provider.   Document Released: 02/24/2008 Document Revised: 09/18/2014 Document Reviewed: 01/23/2011 Elsevier Interactive Patient Education [employer].

## 2015-11-03 NOTE — Assessment & Plan Note (Signed)
Chronic. Great control. RTC 6 mo DM f/u visit.

## 2015-11-03 NOTE — Progress Notes (Signed)
Pre visit review using our clinic review tool, if applicable. No additional management support is needed unless otherwise documented below in the visit note. 

## 2015-11-03 NOTE — Assessment & Plan Note (Signed)
Chronic. bp mildly elevated today. No changes.

## 2015-11-03 NOTE — Assessment & Plan Note (Signed)

## 2015-11-03 NOTE — Assessment & Plan Note (Signed)
Trig elevated - continue current regimen of fish oil TID, simvastatin 20mg  nightly, fibrate 145mg  daily. Encouraged increased fatty fish in diet.

## 2015-11-03 NOTE — Assessment & Plan Note (Addendum)
No signs of infection. Discussed need to monitor for signs of tick borne illness over next 10 days and update [place] for abx course if these develop. Discussed regular skin checks with wife given frequent tick bites.

## 2015-11-03 NOTE — Progress Notes (Signed)
BP 148/70 mmHg  Pulse 80  Temp(Src) 97.6 F (36.4 C) (Oral)  Ht 5\' 10"  (1.778 m)  Wt 272 lb 8 oz (123.605 kg)  BMI 39.10 kg/m2  SpO2 96%   CC: medicare wellness visit  Subjective:    Patient ID: [name] Luna., male    DOB: [birth year], 75 y.o.   MRN: [id number]  HPI: [name] Resendis. is a 75 y.o. male presenting on 11/03/2015 for No chief complaint on file.   Tick bite December. Another one found on butt cheek yesterday. Thinks present for 2 weeks but only started itching yesterday. Deer time. Denies fevers/chills, new joint pains, headache, skin rash.   Occasional intermittent R trapezius pain for months. More active in yard recently. Never picked up methocarbamol.   No falls this year. Denies depression. No anhedonia. No sadness. Vision screen - sees Dr Bing Plume. Dental appt is next month. Denies hearing trouble.  Preventative: Prostate - yearly check. COLONOSCOPY Date: 12/2013 3 polyps, diverticulosis, rec rpt 3 yrs Ardis Hughs)  Flu shot - yearly Pneumovax 09/2011, prevnar 04/2014 Td 09/2011 Shingles shot - declines Advanced directive: would want wife to be HCPOA. Does not want prolonged life support. Packet provided today. Seat belt use discussed Sunscreen use discussed. No changing moles on skin.  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  Diet: some water, fruits/vegetables daily, avoids potatoes   Relevant past medical, surgical, family and social history reviewed and updated as indicated. Interim medical history since our last visit reviewed. Allergies and medications reviewed and updated. Current Outpatient Prescriptions on File Prior to Visit  Medication Sig  . aspirin 325 MG tablet Take 325 mg by mouth daily.  . Cholecalciferol (VITAMIN D) 2000 UNITS CAPS Take 1 capsule by mouth daily.  . fenofibrate (TRICOR) 145 MG tablet Take 1 tablet (145 mg  total) by mouth daily.  . Glucosamine-Chondroitin (OSTEO BI-FLEX REGULAR STRENGTH PO) Take 2 tablets by mouth daily.  Marland Kitchen lisinopril (PRINIVIL,ZESTRIL) 20 MG tablet TAKE 1 TABLET BY MOUTH DAILY  . metFORMIN (GLUCOPHAGE) 1000 MG tablet TAKE 1 TABLET BY MOUTH TWICE A DAY WITH A MEAL  . methocarbamol (ROBAXIN) 500 MG tablet Take 1 tablet (500 mg total) by mouth 3 (three) times daily as needed for muscle spasms.  . Multiple Vitamins-Minerals (MACULAR VITAMIN BENEFIT PO) Take 1 tablet by mouth daily.  . Omega-3 Fatty Acids (FISH OIL) 1200 MG CAPS Take 1 capsule by mouth 3 (three) times daily.  . simvastatin (ZOCOR) 20 MG tablet TAKE ONE TABLET BY MOUTH EVERY NIGHT AT BEDTIME   No current facility-administered medications on file prior to visit.    Review of Systems Per HPI unless specifically indicated in ROS section     Objective:    BP 148/70 mmHg  Pulse 80  Temp(Src) 97.6 F (36.4 C) (Oral)  Ht 5\' 10"  (1.778 m)  Wt 272 lb 8 oz (123.605 kg)  BMI 39.10 kg/m2  SpO2 96%  Wt Readings from Last 3 Encounters:  11/03/15 272 lb 8 oz (123.605 kg)  08/16/15 276 lb (125.193 kg)  04/30/15 281 lb 4 oz (127.574 kg)    Physical Exam  Constitutional: He is oriented to person, place, and time. He appears well-developed and well-nourished. No distress.  HENT:  Head: Normocephalic and atraumatic.  Right Ear: Hearing, tympanic membrane, external ear and ear canal normal.  Left Ear: Hearing, tympanic membrane, external ear  and ear canal normal.  Nose: Nose normal.  Mouth/Throat: Uvula is midline, oropharynx is clear and moist and mucous membranes are normal. No oropharyngeal exudate, posterior oropharyngeal edema or posterior oropharyngeal erythema.  Eyes: Conjunctivae and EOM are normal. Pupils are equal, round, and reactive to light. No scleral icterus.  Neck: Normal range of motion. Neck supple. Carotid bruit is not present. No thyromegaly present.  Cardiovascular: Normal rate, regular rhythm,  normal heart sounds and intact distal pulses.   No murmur heard. Pulses:      Radial pulses are 2+ on the right side, and 2+ on the left side.  Pulmonary/Chest: Effort normal and breath sounds normal. No respiratory distress. He has no wheezes. He has no rales.  Abdominal: Soft. Bowel sounds are normal. He exhibits no distension and no mass. There is no tenderness. There is no rebound and no guarding.  Genitourinary: Rectum normal. Rectal exam shows no external hemorrhoid, no internal hemorrhoid, no fissure, no mass, no tenderness and anal tone normal. Prostate is enlarged (30gm). Prostate is not tender.  Musculoskeletal: Normal range of motion. He exhibits no edema.  Lymphadenopathy:    He has no cervical adenopathy.  Neurological: He is alert and oriented to person, place, and time.  CN grossly intact, station and gait intact Recall 3/3 Calculation 4/5 serial 3s  Skin: Skin is warm and dry. No rash noted.  Site of tick bite R inner buttock minimal pruritic erythema  Psychiatric: He has a normal mood and affect. His behavior is normal. Judgment and thought content normal.  Nursing note and vitals reviewed.  Results for orders placed or performed in visit on 11/01/15  Lipid panel  Result Value Ref Range   Cholesterol 146 0 - 200 mg/dL   Triglycerides 279.0 (H) 0.0 - 149.0 mg/dL   HDL 30.80 (L) >39.00 mg/dL   VLDL 55.8 (H) 0.0 - 40.0 mg/dL   Total CHOL/HDL Ratio 5    NonHDL 115.66   Hemoglobin A1c  Result Value Ref Range   Hgb A1c MFr Bld 6.5 4.6 - 6.5 %  PSA  Result Value Ref Range   PSA 0.77 0.10 - 4.00 ng/mL  Renal function panel  Result Value Ref Range   Sodium 143 135 - 145 mEq/L   Potassium 4.8 3.5 - 5.1 mEq/L   Chloride 106 96 - 112 mEq/L   CO2 27 19 - 32 mEq/L   Calcium 9.6 8.4 - 10.5 mg/dL   Albumin 4.1 3.5 - 5.2 g/dL   BUN 24 (H) 6 - 23 mg/dL   Creatinine, Ser 1.32 0.40 - 1.50 mg/dL   Glucose, Bld 126 (H) 70 - 99 mg/dL   Phosphorus 3.3 2.3 - 4.6 mg/dL   GFR  56.45 (L) >60.00 mL/min  LDL cholesterol, direct  Result Value Ref Range   Direct LDL 70.0 mg/dL      Assessment & Plan:   Problem List Items Addressed This Visit    Type 2 diabetes, controlled, with neuropathy (HCC)    Chronic. Great control. RTC 6 mo DM f/u visit.      Trapezius muscle strain    Never tried muscle relaxant. rec try this.      Tick bite of buttock    No signs of infection. Discussed need to monitor for signs of tick borne illness over next 10 days and update [place] for abx course if these develop. Discussed regular skin checks with wife given frequent tick bites.      Stage 3 chronic kidney  disease due to diabetes mellitus [facility])    Discussed with patient, stable.       Medicare annual wellness visit, subsequent - Primary    I have personally reviewed the Medicare Annual Wellness questionnaire and have noted 1. The patient's medical and social history 2. Their use of alcohol, tobacco or illicit drugs 3. Their current medications and supplements 4. The patient's functional ability including ADL's, fall risks, home safety risks and hearing or visual impairment. Cognitive function has been assessed and addressed as indicated.  5. Diet and physical activity 6. Evidence for depression or mood disorders The patients weight, height, BMI have been recorded in the chart. I have made referrals, counseling and provided education to the patient based on review of the above and I have provided the pt with a written personalized care plan for preventive services. Provider list updated.. See scanned questionairre as needed for further documentation. Reviewed preventative protocols and updated unless pt declined.       HTN (hypertension)    Chronic. bp mildly elevated today. No changes.      HLD (hyperlipidemia)    Trig elevated - continue current regimen of fish oil TID, simvastatin 20mg  nightly, fibrate 145mg  daily. Encouraged increased fatty fish in diet.      BPH  (benign prostatic hypertrophy)    Mild BPH. No treatment. PSA normal.       Advanced care planning/counseling discussion    Advanced directive: would want wife to be HCPOA.Does not want prolonged life support. Planning on hiring lawyer to help fill this out.          Follow up plan: Return in about 6 months (around 05/02/2016), or as needed, for follow up visit.

## 2015-11-03 NOTE — Assessment & Plan Note (Signed)
Discussed with patient, stable.

## 2015-11-03 NOTE — Assessment & Plan Note (Addendum)
Mild BPH. No treatment. PSA normal.

## 2015-11-03 NOTE — Assessment & Plan Note (Addendum)
Advanced directive: would want wife to be HCPOA.Does not want prolonged life support. Planning on hiring lawyer to help fill this out.

## 2015-11-03 NOTE — Assessment & Plan Note (Signed)
Never tried muscle relaxant. rec try this.

## 2015-11-04 MED ORDER — METHOCARBAMOL 500 MG PO TABS: 500.0000 mg | ORAL_TABLET | Freq: Two times a day (BID) | ORAL | Status: DC | PRN

## 2015-11-16 ENCOUNTER — Other Ambulatory Visit: Payer: Self-pay | Admitting: Family Medicine

## 2016-01-10 LAB — HM DIABETES EYE EXAM

## 2016-02-17 DIAGNOSIS — Z961 Presence of intraocular lens: Secondary | ICD-10-CM | POA: Diagnosis not present

## 2016-02-17 DIAGNOSIS — H353131 Nonexudative age-related macular degeneration, bilateral, early dry stage: Secondary | ICD-10-CM | POA: Diagnosis not present

## 2016-02-17 DIAGNOSIS — H01001 Unspecified blepharitis right upper eyelid: Secondary | ICD-10-CM | POA: Diagnosis not present

## 2016-02-17 DIAGNOSIS — H0015 Chalazion left lower eyelid: Secondary | ICD-10-CM | POA: Diagnosis not present

## 2016-02-17 DIAGNOSIS — E119 Type 2 diabetes mellitus without complications: Secondary | ICD-10-CM | POA: Diagnosis not present

## 2016-05-01 ENCOUNTER — Other Ambulatory Visit (INDEPENDENT_AMBULATORY_CARE_PROVIDER_SITE_OTHER): Payer: Medicare Other

## 2016-05-01 ENCOUNTER — Other Ambulatory Visit: Payer: Self-pay | Admitting: Family Medicine

## 2016-05-01 DIAGNOSIS — E785 Hyperlipidemia, unspecified: Secondary | ICD-10-CM

## 2016-05-01 DIAGNOSIS — E1122 Type 2 diabetes mellitus with diabetic chronic kidney disease: Secondary | ICD-10-CM

## 2016-05-01 DIAGNOSIS — E1140 Type 2 diabetes mellitus with diabetic neuropathy, unspecified: Secondary | ICD-10-CM | POA: Diagnosis not present

## 2016-05-01 DIAGNOSIS — N183 Chronic kidney disease, stage 3 (moderate): Secondary | ICD-10-CM | POA: Diagnosis not present

## 2016-05-01 LAB — RENAL FUNCTION PANEL
Albumin: 4 g/dL (ref 3.5–5.2)
BUN: 22 mg/dL (ref 6–23)
CALCIUM: 9.1 mg/dL (ref 8.4–10.5)
CO2: 26 meq/L (ref 19–32)
CREATININE: 1.39 mg/dL (ref 0.40–1.50)
Chloride: 107 mEq/L (ref 96–112)
GFR: 53.11 mL/min — ABNORMAL LOW (ref >60.00)
Glucose, Bld: 125 mg/dL — ABNORMAL HIGH (ref 70–99)
Phosphorus: 3.2 mg/dL (ref 2.3–4.6)
Potassium: 4.6 mEq/L (ref 3.5–5.1)
Sodium: 141 mEq/L (ref 135–145)

## 2016-05-01 LAB — LIPID PANEL
CHOL/HDL RATIO: 5
Cholesterol: 136 mg/dL (ref 0–200)
HDL: 29.9 mg/dL — AB (ref >39.00)
NONHDL: 105.67
Triglycerides: 295 mg/dL — ABNORMAL HIGH (ref 0.0–149.0)
VLDL: 59 mg/dL — ABNORMAL HIGH (ref 0.0–40.0)

## 2016-05-01 LAB — LDL CHOLESTEROL, DIRECT: Direct LDL: 63 mg/dL

## 2016-05-01 LAB — HEMOGLOBIN A1C: HEMOGLOBIN A1C: 6.7 % — AB (ref 4.6–6.5)

## 2016-05-05 ENCOUNTER — Encounter: Payer: Self-pay | Admitting: Family Medicine

## 2016-05-05 ENCOUNTER — Ambulatory Visit (INDEPENDENT_AMBULATORY_CARE_PROVIDER_SITE_OTHER): Payer: Medicare Other | Admitting: Family Medicine

## 2016-05-05 VITALS — BP 134/74 | HR 76 | Temp 97.5°F | Wt 272.8 lb

## 2016-05-05 DIAGNOSIS — E785 Hyperlipidemia, unspecified: Secondary | ICD-10-CM | POA: Diagnosis not present

## 2016-05-05 DIAGNOSIS — K219 Gastro-esophageal reflux disease without esophagitis: Secondary | ICD-10-CM

## 2016-05-05 DIAGNOSIS — E1140 Type 2 diabetes mellitus with diabetic neuropathy, unspecified: Secondary | ICD-10-CM | POA: Diagnosis not present

## 2016-05-05 DIAGNOSIS — E1122 Type 2 diabetes mellitus with diabetic chronic kidney disease: Secondary | ICD-10-CM

## 2016-05-05 DIAGNOSIS — I10 Essential (primary) hypertension: Secondary | ICD-10-CM

## 2016-05-05 DIAGNOSIS — N183 Chronic kidney disease, stage 3 (moderate): Secondary | ICD-10-CM

## 2016-05-05 MED ORDER — OMEPRAZOLE 40 MG PO CPDR: DELAYED_RELEASE_CAPSULE | ORAL | 3 refills | Status: DC

## 2016-05-05 NOTE — Assessment & Plan Note (Addendum)
Chronic, stable. Continue current regimen. 

## 2016-05-05 NOTE — Assessment & Plan Note (Signed)
Worsening recently. Treat with omeprazole 40mg  daily for 3 wks then PRN.

## 2016-05-05 NOTE — Progress Notes (Signed)
BP 134/74   Pulse 76   Temp 97.5 F (36.4 C) (Oral)   Wt 272 lb 12 oz (123.7 kg)   BMI 39.14 kg/m    CC: 13mo f/u visit Subjective:    Patient ID: [name] Luna., male    DOB: December 10, 1940, 75 y.o.   MRN: [id number]  HPI: [name] Schlepp. is a 75 y.o. male presenting on 05/05/2016 for Follow-up   Several month h/o worsening reflux. Worse with lettuce, now notes even with water. Treats with tums. No dysphagia, nausea/vomiting.   DM - regularly does not check sugars. Eats piece of candy every day (payday). Compliant with antihyperglycemic regimen which includes: metformin 1000mg  bid. Denies low sugars or hypoglycemic symptoms. Mild paresthesias of feet. Last diabetic eye exam 01/2016.  Pneumovax: 2013.  Prevnar: 2015. Lab Results  Component Value Date   HGBA1C 6.7 (H) 05/01/2016   Diabetic Foot Exam - Simple   Simple Foot Form Diabetic Foot exam was performed with the following findings:  Yes 05/05/2016  8:25 AM  Visual Inspection No deformities, no ulcerations, no other skin breakdown bilaterally:  Yes Sensation Testing See comments:  Yes Pulse Check Posterior Tibialis and Dorsalis pulse intact bilaterally:  Yes Comments Diminished to monofilament testing bilaterally     HLD - compliant with fish oil TID, simvastatin 20mg  nightly, fibrate 145mg  daily without myalgias.  HTN - Compliant with current antihypertensive regimen of lisinopril 20mg  daily.  Does not check blood pressures at home. No low blood pressure readings or symptoms of dizziness/syncope. Denies HA, vision changes, CP/tightness, SOB, leg swelling.   Relevant past medical, surgical, family and social history reviewed and updated as indicated. Interim medical history since our last visit reviewed. Allergies and medications reviewed and updated. Current Outpatient Prescriptions on File Prior to Visit  Medication Sig  . aspirin 325 MG tablet Take 325 mg by mouth daily.  . Cholecalciferol (VITAMIN D) 2000  UNITS CAPS Take 1 capsule by mouth daily.  . fenofibrate (TRICOR) 145 MG tablet Take 1 tablet (145 mg total) by mouth daily.  . Glucosamine-Chondroitin (OSTEO BI-FLEX REGULAR STRENGTH PO) Take 2 tablets by mouth daily.  Marland Kitchen lisinopril (PRINIVIL,ZESTRIL) 20 MG tablet TAKE 1 TABLET BY MOUTH DAILY  . metFORMIN (GLUCOPHAGE) 1000 MG tablet TAKE 1 TABLET BY MOUTH TWICE A DAY WITH A MEAL  . methocarbamol (ROBAXIN) 500 MG tablet Take 1 tablet (500 mg total) by mouth 2 (two) times daily as needed for muscle spasms.  . Multiple Vitamins-Minerals (MACULAR VITAMIN BENEFIT PO) Take 1 tablet by mouth daily.  . Omega-3 Fatty Acids (FISH OIL) 1200 MG CAPS Take 1 capsule by mouth 3 (three) times daily.  . simvastatin (ZOCOR) 20 MG tablet TAKE ONE TABLET BY MOUTH EVERY NIGHT AT BEDTIME   No current facility-administered medications on file prior to visit.     Review of Systems Per HPI unless specifically indicated in ROS section     Objective:    BP 134/74   Pulse 76   Temp 97.5 F (36.4 C) (Oral)   Wt 272 lb 12 oz (123.7 kg)   BMI 39.14 kg/m   Wt Readings from Last 3 Encounters:  05/05/16 272 lb 12 oz (123.7 kg)  11/03/15 272 lb 8 oz (123.6 kg)  08/16/15 276 lb (125.2 kg)    Physical Exam  Constitutional: He appears well-developed and well-nourished. No distress.  HENT:  Head: Normocephalic and atraumatic.  Right Ear: External ear normal.  Left Ear: External ear normal.  Nose: Nose normal.  Mouth/Throat: Oropharynx is clear and moist. No oropharyngeal exudate.  Eyes: Conjunctivae and EOM are normal. Pupils are equal, round, and reactive to light. No scleral icterus.  Neck: Normal range of motion. Neck supple.  Cardiovascular: Normal rate, regular rhythm, normal heart sounds and intact distal pulses.   No murmur heard. Pulmonary/Chest: Effort normal and breath sounds normal. No respiratory distress. He has no wheezes. He has no rales.  Musculoskeletal: He exhibits no edema.  See HPI for foot  exam if done  Lymphadenopathy:    He has no cervical adenopathy.  Skin: Skin is warm and dry. No rash noted.  Psychiatric: He has a normal mood and affect.  Nursing note and vitals reviewed.  Results for orders placed or performed in visit on 05/05/16  HM DIABETES EYE EXAM  Result Value Ref Range   HM Diabetic Eye Exam No Retinopathy No Retinopathy      Assessment & Plan:   Problem List Items Addressed This Visit    Dyslipidemia    Chronic. Trig remain elevated despite 3 med regimen. Discussed diet changes to improve triglyceride levels.       GERD (gastroesophageal reflux disease)    Worsening recently. Treat with omeprazole 40mg  daily for 3 wks then PRN.       Relevant Medications   omeprazole (PRILOSEC) 40 MG capsule   HTN (hypertension)    Chronic, stable. Continue current regimen.      Stage 3 chronic kidney disease due to diabetes mellitus ([place])    Discussed this. Encouraged staying well hydrated, avoiding NSAIDs and other nephrotoxins.      Type 2 diabetes, controlled, with neuropathy (HCC)    Chronic, stable. Continue current regimen of metformin.        Other Visit Diagnoses   None.      Follow up plan: Return in about 6 months (around 11/05/2016), or as needed, for medicare wellness visit.  Ria Bush, MD

## 2016-05-05 NOTE — Patient Instructions (Addendum)
Continue current medicines.  Start omeprazole 40mg  daily for 1 month then as needed.  Try to back off tums.  Other strategies to help heartburn -  Head of bed elevated. Avoidance of citrus, fatty foods, chocolate, peppermint, and excessive alcohol, along with sodas, orange juice (acidic drinks).  At least a few hours between dinner and bed, minimize naps after eating. Return in 6 months for medicare wellness visit  Gastroesophageal Reflux Disease, Adult Normally, food travels down the esophagus and stays in the stomach to be digested. However, when a person has gastroesophageal reflux disease (GERD), food and stomach acid move back up into the esophagus. When this happens, the esophagus becomes sore and inflamed. Over time, GERD can create small holes (ulcers) in the lining of the esophagus.  CAUSES This condition is caused by a problem with the muscle between the esophagus and the stomach (lower esophageal sphincter, or LES). Normally, the LES muscle closes after food passes through the esophagus to the stomach. When the LES is weakened or abnormal, it does not close properly, and that allows food and stomach acid to go back up into the esophagus. The LES can be weakened by certain dietary substances, medicines, and medical conditions, including:  Tobacco use.  Pregnancy.  Having a hiatal hernia.  Heavy alcohol use.  Certain foods and beverages, such as coffee, chocolate, onions, and peppermint. RISK FACTORS This condition is more likely to develop in:  People who have an increased body weight.  People who have connective tissue disorders.  People who use NSAID medicines. SYMPTOMS Symptoms of this condition include:  Heartburn.  Difficult or painful swallowing.  The feeling of having a lump in the throat.  Abitter taste in the mouth.  Bad breath.  Having a large amount of saliva.  Having an upset or bloated stomach.  Belching.  Chest pain.  Shortness of breath or  wheezing.  Ongoing (chronic) cough or a night-time cough.  Wearing away of tooth enamel.  Weight loss. Different conditions can cause chest pain. Make sure to see your health care provider if you experience chest pain. DIAGNOSIS Your health care provider will take a medical history and perform a physical exam. To determine if you have mild or severe GERD, your health care provider may also monitor how you respond to treatment. You may also have other tests, including:  An endoscopy toexamine your stomach and esophagus with a small camera.  A test thatmeasures the acidity level in your esophagus.  A test thatmeasures how much pressure is on your esophagus.  A barium swallow or modified barium swallow to show the shape, size, and functioning of your esophagus. TREATMENT The goal of treatment is to help relieve your symptoms and to prevent complications. Treatment for this condition may vary depending on how severe your symptoms are. Your health care provider may recommend:  Changes to your diet.  Medicine.  Surgery. HOME CARE INSTRUCTIONS Diet  Follow a diet as recommended by your health care provider. This may involve avoiding foods and drinks such as:  Coffee and tea (with or without caffeine).  Drinks that containalcohol.  Energy drinks and sports drinks.  Carbonated drinks or sodas.  Chocolate and cocoa.  Peppermint and mint flavorings.  Garlic and onions.  Horseradish.  Spicy and acidic foods, including peppers, chili powder, curry powder, vinegar, hot sauces, and barbecue sauce.  Citrus fruit juices and citrus fruits, such as oranges, lemons, and limes.  Tomato-based foods, such as red sauce, chili, salsa, and pizza  with red sauce.  Fried and fatty foods, such as donuts, french fries, potato chips, and high-fat dressings.  High-fat meats, such as hot dogs and fatty cuts of red and white meats, such as rib eye steak, sausage, ham, and bacon.  High-fat  dairy items, such as whole milk, butter, and cream cheese.  Eat small, frequent meals instead of large meals.  Avoid drinking large amounts of liquid with your meals.  Avoid eating meals during the 2-3 hours before bedtime.  Avoid lying down right after you eat.  Do not exercise right after you eat. General Instructions  Pay attention to any changes in your symptoms.  Take over-the-counter and prescription medicines only as told by your health care provider. Do not take aspirin, ibuprofen, or other NSAIDs unless your health care provider told you to do so.  Do not use any tobacco products, including cigarettes, chewing tobacco, and e-cigarettes. If you need help quitting, ask your health care provider.  Wear loose-fitting clothing. Do not wear anything tight around your waist that causes pressure on your abdomen.  Raise (elevate) the head of your bed 6 inches (15cm).  Try to reduce your stress, such as with yoga or meditation. If you need help reducing stress, ask your health care provider.  If you are overweight, reduce your weight to an amount that is healthy for you. Ask your health care provider for guidance about a safe weight loss goal.  Keep all follow-up visits as told by your health care provider. This is important. SEEK MEDICAL CARE IF:  You have new symptoms.  You have unexplained weight loss.  You have difficulty swallowing, or it hurts to swallow.  You have wheezing or a persistent cough.  Your symptoms do not improve with treatment.  You have a hoarse voice. SEEK IMMEDIATE MEDICAL CARE IF:  You have pain in your arms, neck, jaw, teeth, or back.  You feel sweaty, dizzy, or light-headed.  You have chest pain or shortness of breath.  You vomit and your vomit looks like blood or coffee grounds.  You faint.  Your stool is bloody or black.  You cannot swallow, drink, or eat.   This information is not intended to replace advice given to you by your  health care provider. Make sure you discuss any questions you have with your health care provider.   Document Released: 06/07/2005 Document Revised: 05/19/2015 Document Reviewed: 12/23/2014 Elsevier Interactive Patient Education [employer].

## 2016-05-05 NOTE — Progress Notes (Signed)
Pre visit review using our clinic review tool, if applicable. No additional management support is needed unless otherwise documented below in the visit note. 

## 2016-05-05 NOTE — Assessment & Plan Note (Signed)
Discussed this. Encouraged staying well hydrated, avoiding NSAIDs and other nephrotoxins.

## 2016-05-05 NOTE — Assessment & Plan Note (Signed)
Chronic, stable. Continue current regimen of metformin.

## 2016-05-05 NOTE — Assessment & Plan Note (Signed)
Chronic. Trig remain elevated despite 3 med regimen. Discussed diet changes to improve triglyceride levels.

## 2016-05-23 DIAGNOSIS — Z23 Encounter for immunization: Secondary | ICD-10-CM | POA: Diagnosis not present

## 2016-05-30 ENCOUNTER — Other Ambulatory Visit: Payer: Self-pay | Admitting: Family Medicine

## 2016-08-21 ENCOUNTER — Other Ambulatory Visit: Payer: Self-pay | Admitting: Family Medicine

## 2016-10-08 ENCOUNTER — Other Ambulatory Visit: Payer: Self-pay | Admitting: Family Medicine

## 2016-10-16 ENCOUNTER — Encounter: Payer: Self-pay | Admitting: Gastroenterology

## 2016-11-05 ENCOUNTER — Other Ambulatory Visit: Payer: Self-pay | Admitting: Family Medicine

## 2016-11-05 DIAGNOSIS — N400 Enlarged prostate without lower urinary tract symptoms: Secondary | ICD-10-CM

## 2016-11-05 DIAGNOSIS — E785 Hyperlipidemia, unspecified: Secondary | ICD-10-CM

## 2016-11-05 DIAGNOSIS — N183 Chronic kidney disease, stage 3 unspecified: Secondary | ICD-10-CM

## 2016-11-05 DIAGNOSIS — E1122 Type 2 diabetes mellitus with diabetic chronic kidney disease: Secondary | ICD-10-CM

## 2016-11-05 DIAGNOSIS — E1140 Type 2 diabetes mellitus with diabetic neuropathy, unspecified: Secondary | ICD-10-CM

## 2016-11-06 ENCOUNTER — Other Ambulatory Visit: Payer: Medicare Other

## 2016-11-06 ENCOUNTER — Encounter (INDEPENDENT_AMBULATORY_CARE_PROVIDER_SITE_OTHER): Payer: Self-pay

## 2016-11-06 ENCOUNTER — Ambulatory Visit (INDEPENDENT_AMBULATORY_CARE_PROVIDER_SITE_OTHER): Payer: Medicare Other

## 2016-11-06 VITALS — BP 130/80 | HR 67 | Temp 98.5°F | Ht 69.5 in | Wt 276.5 lb

## 2016-11-06 DIAGNOSIS — E785 Hyperlipidemia, unspecified: Secondary | ICD-10-CM | POA: Diagnosis not present

## 2016-11-06 DIAGNOSIS — N183 Chronic kidney disease, stage 3 unspecified: Secondary | ICD-10-CM

## 2016-11-06 DIAGNOSIS — N400 Enlarged prostate without lower urinary tract symptoms: Secondary | ICD-10-CM

## 2016-11-06 DIAGNOSIS — Z125 Encounter for screening for malignant neoplasm of prostate: Secondary | ICD-10-CM | POA: Diagnosis not present

## 2016-11-06 DIAGNOSIS — E1122 Type 2 diabetes mellitus with diabetic chronic kidney disease: Secondary | ICD-10-CM | POA: Diagnosis not present

## 2016-11-06 DIAGNOSIS — Z0000 Encounter for general adult medical examination without abnormal findings: Secondary | ICD-10-CM | POA: Diagnosis not present

## 2016-11-06 DIAGNOSIS — E1140 Type 2 diabetes mellitus with diabetic neuropathy, unspecified: Secondary | ICD-10-CM | POA: Diagnosis not present

## 2016-11-06 LAB — LIPID PANEL
CHOL/HDL RATIO: 5
Cholesterol: 129 mg/dL (ref 0–200)
HDL: 28.6 mg/dL — AB (ref >39.00)
NonHDL: 100.85
TRIGLYCERIDES: 210 mg/dL — AB (ref 0.0–149.0)
VLDL: 42 mg/dL — AB (ref 0.0–40.0)

## 2016-11-06 LAB — COMPREHENSIVE METABOLIC PANEL
ALT: 30 U/L (ref 0–53)
AST: 30 U/L (ref 0–37)
Albumin: 3.9 g/dL (ref 3.5–5.2)
Alkaline Phosphatase: 26 U/L — ABNORMAL LOW (ref 39–117)
BUN: 19 mg/dL (ref 6–23)
CALCIUM: 9.3 mg/dL (ref 8.4–10.5)
CHLORIDE: 108 meq/L (ref 96–112)
CO2: 28 meq/L (ref 19–32)
Creatinine, Ser: 1.28 mg/dL (ref 0.40–1.50)
GFR: 58.33 mL/min — AB (ref >60.00)
Glucose, Bld: 126 mg/dL — ABNORMAL HIGH (ref 70–99)
POTASSIUM: 4.7 meq/L (ref 3.5–5.1)
Sodium: 145 mEq/L (ref 135–145)
Total Bilirubin: 0.6 mg/dL (ref 0.2–1.2)
Total Protein: 6.3 g/dL (ref 6.0–8.3)

## 2016-11-06 LAB — HEMOGLOBIN A1C: Hgb A1c MFr Bld: 6.6 % — ABNORMAL HIGH (ref 4.6–6.5)

## 2016-11-06 LAB — CBC WITH DIFFERENTIAL/PLATELET
BASOS PCT: 0.4 % (ref 0.0–3.0)
Basophils Absolute: 0 10*3/uL (ref 0.0–0.1)
EOS PCT: 2.1 % (ref 0.0–5.0)
Eosinophils Absolute: 0.1 10*3/uL (ref 0.0–0.7)
HEMATOCRIT: 38.5 % — AB (ref 39.0–52.0)
Hemoglobin: 12.6 g/dL — ABNORMAL LOW (ref 13.0–17.0)
LYMPHS PCT: 31.3 % (ref 12.0–46.0)
Lymphs Abs: 2.1 10*3/uL (ref 0.7–4.0)
MCHC: 32.7 g/dL (ref 30.0–36.0)
MCV: 91.4 fl (ref 78.0–100.0)
MONOS PCT: 6.9 % (ref 3.0–12.0)
Monocytes Absolute: 0.5 10*3/uL (ref 0.1–1.0)
Neutro Abs: 4.1 10*3/uL (ref 1.4–7.7)
Neutrophils Relative %: 59.3 % (ref 43.0–77.0)
Platelets: 226 10*3/uL (ref 150.0–400.0)
RBC: 4.21 Mil/uL — ABNORMAL LOW (ref 4.22–5.81)
RDW: 15.1 % (ref 11.5–15.5)
WBC: 6.9 10*3/uL (ref 4.0–10.5)

## 2016-11-06 LAB — PSA, MEDICARE: PSA: 0.66 ng/mL (ref 0.10–4.00)

## 2016-11-06 LAB — LDL CHOLESTEROL, DIRECT: Direct LDL: 64 mg/dL

## 2016-11-06 LAB — VITAMIN D 25 HYDROXY (VIT D DEFICIENCY, FRACTURES): VITD: 39.53 ng/mL (ref 30.00–100.00)

## 2016-11-06 NOTE — Patient Instructions (Signed)
[name] Young , Thank you for taking time to come for your Medicare Wellness Visit. I appreciate your ongoing commitment to your health goals. Please review the following plan we discussed and let me know if I can assist you in the future.   These are the goals we discussed: Goals    . Eat more fruits and vegetables          Starting 11/06/16, I will continue to eat at least 4-5 servings of fresh fruits and vegetables daily.        This is a list of the screening recommended for you and due dates:  Health Maintenance  Topic Date Due  . Stool Blood Test  01/05/2018*  . DTaP/Tdap/Td vaccine (1 - Tdap) 10/10/2021*  . Colon Cancer Screening  01/05/2017  . Eye exam for diabetics  01/09/2017  . Complete foot exam   05/05/2017  . Hemoglobin A1C  05/06/2017  . Tetanus Vaccine  10/10/2021  . Flu Shot  Addressed  . Pneumonia vaccines  Completed  *Topic was postponed. The date shown is not the original due date.   Preventive Care for Adults  A healthy lifestyle and preventive care can promote health and wellness. Preventive health guidelines for adults include the following key practices.  . A routine yearly physical is a good way to check with your health care provider about your health and preventive screening. It is a chance to share any concerns and updates on your health and to receive a thorough exam.  . Visit your dentist for a routine exam and preventive care every 6 months. Brush your teeth twice a day and floss once a day. Good oral hygiene prevents tooth decay and gum disease.  . The frequency of eye exams is based on your age, health, family medical history, use  of contact lenses, and other factors. Follow your health care provider's ecommendations for frequency of eye exams.  . Eat a healthy diet. Foods like vegetables, fruits, whole grains, low-fat dairy products, and lean protein foods contain the nutrients you need without too many calories. Decrease your intake of foods high  in solid fats, added sugars, and salt. Eat the right amount of calories for you. Get information about a proper diet from your health care provider, if necessary.  . Regular physical exercise is one of the most important things you can do for your health. Most adults should get at least 150 minutes of moderate-intensity exercise (any activity that increases your heart rate and causes you to sweat) each week. In addition, most adults need muscle-strengthening exercises on 2 or more days a week.  Silver Sneakers may be a benefit available to you. To determine eligibility, you may visit the website: www.silversneakers.com or contact program at 947-371-5005 Mon-Fri between 8AM-8PM.   . Maintain a healthy weight. The body mass index (BMI) is a screening tool to identify possible weight problems. It provides an estimate of body fat based on height and weight. Your health care provider can find your BMI and can help you achieve or maintain a healthy weight.   For adults 20 years and older: ? A BMI below 18.5 is considered underweight. ? A BMI of 18.5 to 24.9 is normal. ? A BMI of 25 to 29.9 is considered overweight. ? A BMI of 30 and above is considered obese.   . Maintain normal blood lipids and cholesterol levels by exercising and minimizing your intake of saturated fat. Eat a balanced diet with plenty of fruit and  vegetables. Blood tests for lipids and cholesterol should begin at age 78 and be repeated every 5 years. If your lipid or cholesterol levels are high, you are over 50, or you are at high risk for heart disease, you may need your cholesterol levels checked more frequently. Ongoing high lipid and cholesterol levels should be treated with medicines if diet and exercise are not working.  . If you smoke, find out from your health care provider how to quit. If you do not use tobacco, please do not start.  . If you choose to drink alcohol, please do not consume more than 2 drinks per day. One  drink is considered to be 12 ounces (355 mL) of beer, 5 ounces (148 mL) of wine, or 1.5 ounces (44 mL) of liquor.  . If you are 48-71 years old, ask your health care provider if you should take aspirin to prevent strokes.  . Use sunscreen. Apply sunscreen liberally and repeatedly throughout the day. You should seek shade when your shadow is shorter than you. Protect yourself by wearing long sleeves, pants, a wide-brimmed hat, and sunglasses year round, whenever you are outdoors.  . Once a month, do a whole body skin exam, using a mirror to look at the skin on your back. Tell your health care provider of new moles, moles that have irregular borders, moles that are larger than a pencil eraser, or moles that have changed in shape or color.

## 2016-11-06 NOTE — Progress Notes (Signed)
PCP notes:   Health maintenance:  Flu vaccine - per pt, administered in Oct 2017 A1C - completed Colonoscopy - addressed; pt stated he was going to wait until April 2019 to get screening  Abnormal screenings:   Hearing - failed  Patient concerns:   None  Nurse concerns:  None  Next PCP appt:   11/10/16 @ 1115

## 2016-11-06 NOTE — Progress Notes (Signed)
Pre visit review using our clinic review tool, if applicable. No additional management support is needed unless otherwise documented below in the visit note. 

## 2016-11-06 NOTE — Progress Notes (Signed)
I reviewed health advisor's note, was available for consultation, and agree with documentation and plan.  

## 2016-11-06 NOTE — Progress Notes (Signed)
Subjective:   [name] Young. is a 76 y.o. male who presents for Medicare Annual/Subsequent preventive examination.  Review of Systems:  N/A Cardiac Risk Factors include: advanced age (>41men, >83 women);diabetes mellitus;male gender;obesity (BMI >30kg/m2);dyslipidemia;hypertension     Objective:    Vitals: BP 130/80 (BP Location: Right Arm, Patient Position: Sitting, Cuff Size: Large)   Pulse 67   Temp 98.5 F (36.9 C) (Oral)   Ht 5' 9.5" (1.765 m) Comment: no shoes  Wt 276 lb 8 oz (125.4 kg)   SpO2 97%   BMI 40.25 kg/m   Body mass index is 40.25 kg/m.  Tobacco History  Smoking Status  . Never Smoker  Smokeless Tobacco  . Former Systems developer  . Types: Chew  . Quit date: 09/08/2001     Counseling given: No   Past Medical History:  Diagnosis Date  . Chronic kidney disease    kidney stones  . GERD (gastroesophageal reflux disease)   . History of cholelithiasis   . History of kidney stones    ca ox [name] @ [facility]) now [place]  . History of pneumonia   . HLD (hyperlipidemia)   . HTN (hypertension)   . T2DM (type 2 diabetes mellitus) ([place]) 2010   Past Surgical History:  Procedure Laterality Date  . CARPAL TUNNEL RELEASE Bilateral   . CATARACT EXTRACTION, BILATERAL    . COLONOSCOPY  11/2012   11 adenomatous polyps, diverticulosis, rec rpt 1 yr Ardis Hughs)  . COLONOSCOPY  12/2013   3 polyps, diverticulosis, rec rpt 3 yrs Ardis Hughs)  . KNEE CARTILAGE SURGERY Left   . LITHOTRIPSY    . UMBILICAL HERNIA REPAIR     with mesh   Family History  Problem Relation Age of Onset  . Alzheimer's disease Mother   . Breast cancer Mother     breast  . Stroke Father 42  . Diabetes Father   . Rectal cancer Maternal Grandfather     rectal  . Colon cancer Maternal Grandfather 71  . CAD Neg Hx   . Esophageal cancer Neg Hx   . Stomach cancer Neg Hx    History  Sexual Activity  . Sexual activity: Not on file    Outpatient Encounter Prescriptions as of 11/06/2016    Medication Sig  . aspirin 325 MG tablet Take 325 mg by mouth daily.  . Cholecalciferol (VITAMIN D) 2000 UNITS CAPS Take 1 capsule by mouth daily.  . fenofibrate (TRICOR) 145 MG tablet TAKE 1 TABLET BY MOUTH DAILY  . Glucosamine-Chondroitin (OSTEO BI-FLEX REGULAR STRENGTH PO) Take 2 tablets by mouth daily.  Marland Kitchen lisinopril (PRINIVIL,ZESTRIL) 20 MG tablet TAKE 1 TABLET BY MOUTH DAILY  . metFORMIN (GLUCOPHAGE) 1000 MG tablet TAKE 1 TABLET BY MOUTH TWICE A DAY WITH A MEAL  . methocarbamol (ROBAXIN) 500 MG tablet Take 1 tablet (500 mg total) by mouth 2 (two) times daily as needed for muscle spasms.  . Multiple Vitamins-Minerals (MACULAR VITAMIN BENEFIT PO) Take 1 tablet by mouth daily.  . Omega-3 Fatty Acids (FISH OIL) 1200 MG CAPS Take 1 capsule by mouth 3 (three) times daily.  Marland Kitchen omeprazole (PRILOSEC) 40 MG capsule TAKE ONE CAPSULE BY MOUTH DAILY FOR 3 WEEKS  THEN AS NEEDED FOR GERD  . simvastatin (ZOCOR) 20 MG tablet TAKE ONE TABLET BY MOUTH EVERY NIGHT AT BEDTIME   No facility-administered encounter medications on file as of 11/06/2016.     Activities of Daily Living In your present state of health, do you have any difficulty performing  the following activities: 11/06/2016  Hearing? Y  Vision? N  Difficulty concentrating or making decisions? Y  Walking or climbing stairs? Y  Dressing or bathing? N  Doing errands, shopping? N  Preparing Food and eating ? N  Using the Toilet? N  In the past six months, have you accidently leaked urine? Y  Do you have problems with loss of bowel control? N  Managing your Medications? Y  Managing your Finances? Y  Housekeeping or managing your Housekeeping? N  Some recent data might be hidden    Patient Care Team: Ria Bush, MD as PCP - General (Family Medicine) Calvert Cantor, MD as Consulting Physician (Ophthalmology)   Assessment:     Hearing Screening   125Hz  250Hz  500Hz  1000Hz  2000Hz  3000Hz  4000Hz  6000Hz  8000Hz   Right ear:   40 0 40  0     Left ear:   40 40 40  0    Vision Screening Comments: Last vision exam in May 2017 with Dr. Bing Plume  Exercise Activities and Dietary recommendations Current Exercise Habits: The patient does not participate in regular exercise at present, Exercise limited by: neurologic condition(s);orthopedic condition(s)  Goals    . Eat more fruits and vegetables          Starting 11/06/16, I will continue to eat at least 4-5 servings of fresh fruits and vegetables daily.       Fall Risk Fall Risk  11/06/2016 11/03/2015 10/30/2014 10/13/2013 10/11/2012  Falls in the past year? No No No No No  Risk for fall due to : - - - - -  Risk for fall due to (comments): - - - - -   Depression Screen PHQ 2/9 Scores 11/06/2016 11/03/2015 10/30/2014 10/13/2013  PHQ - 2 Score 0 0 0 0    Cognitive Function MMSE - Mini Mental State Exam 11/06/2016  Orientation to time 5  Orientation to Place 5  Registration 3  Attention/ Calculation 0  Recall 3  Language- name 2 objects 0  Language- repeat 1  Language- follow 3 step command 3  Language- read & follow direction 0  Write a sentence 0  Copy design 0  Total score 20     PLEASE NOTE: A Mini-Cog screen was completed. Maximum score is 20. A value of 0 denotes this part of Folstein MMSE was not completed or the patient failed this part of the Mini-Cog screening.   Mini-Cog Screening Orientation to Time - Max 5 pts Orientation to Place - Max 5 pts Registration - Max 3 pts Recall - Max 3 pts Language Repeat - Max 1 pts Language Follow 3 Step Command - Max 3 pts     Immunization History  Administered Date(s) Administered  . Influenza,inj,Quad PF,36+ Mos 06/22/2014  . Influenza-Unspecified 05/26/2013, 06/21/2015, 06/11/2016  . Pneumococcal Conjugate-13 04/28/2014  . Pneumococcal Polysaccharide-23 10/11/2011  . Td 10/11/2011   Screening Tests Health Maintenance  Topic Date Due  . COLON CANCER SCREENING ANNUAL FOBT  01/05/2018 (Originally 01/06/2015)  .  DTaP/Tdap/Td (1 - Tdap) 10/10/2021 (Originally 10/12/2011)  . COLONOSCOPY  01/05/2017  . OPHTHALMOLOGY EXAM  01/09/2017  . FOOT EXAM  05/05/2017  . HEMOGLOBIN A1C  05/06/2017  . TETANUS/TDAP  10/10/2021  . INFLUENZA VACCINE  Addressed  . PNA vac Low Risk Adult  Completed      Plan:     I have personally reviewed and addressed the Medicare Annual Wellness questionnaire and have noted the following in the patient's chart:  A. Medical and social  history B. Use of alcohol, tobacco or illicit drugs  C. Current medications and supplements D. Functional ability and status E.  Nutritional status F.  Physical activity G. Advance directives H. List of other physicians I.  Hospitalizations, surgeries, and ER visits in previous 12 months J.  [place] to include hearing, vision, cognitive, depression L. Referrals and appointments - none  In addition, I have reviewed and discussed with patient certain preventive protocols, quality metrics, and best practice recommendations. A written personalized care plan for preventive services as well as general preventive health recommendations were provided to patient.  See attached scanned questionnaire for additional information.   Signed,   Lindell Noe, MHA, BS, LPN Health Coach

## 2016-11-08 ENCOUNTER — Encounter: Payer: Medicare Other | Admitting: Family Medicine

## 2016-11-10 ENCOUNTER — Encounter: Payer: Medicare Other | Admitting: Family Medicine

## 2016-11-21 ENCOUNTER — Other Ambulatory Visit: Payer: Self-pay | Admitting: Family Medicine

## 2016-12-12 ENCOUNTER — Ambulatory Visit (INDEPENDENT_AMBULATORY_CARE_PROVIDER_SITE_OTHER): Payer: Medicare Other | Admitting: Family Medicine

## 2016-12-12 ENCOUNTER — Encounter: Payer: Self-pay | Admitting: Family Medicine

## 2016-12-12 VITALS — BP 142/76 | HR 65 | Temp 98.0°F | Ht 69.5 in | Wt 276.5 lb

## 2016-12-12 DIAGNOSIS — E1122 Type 2 diabetes mellitus with diabetic chronic kidney disease: Secondary | ICD-10-CM

## 2016-12-12 DIAGNOSIS — Z0000 Encounter for general adult medical examination without abnormal findings: Secondary | ICD-10-CM | POA: Diagnosis not present

## 2016-12-12 DIAGNOSIS — N400 Enlarged prostate without lower urinary tract symptoms: Secondary | ICD-10-CM

## 2016-12-12 DIAGNOSIS — Z0001 Encounter for general adult medical examination with abnormal findings: Secondary | ICD-10-CM | POA: Insufficient documentation

## 2016-12-12 DIAGNOSIS — I10 Essential (primary) hypertension: Secondary | ICD-10-CM | POA: Diagnosis not present

## 2016-12-12 DIAGNOSIS — E1140 Type 2 diabetes mellitus with diabetic neuropathy, unspecified: Secondary | ICD-10-CM

## 2016-12-12 DIAGNOSIS — E663 Overweight: Secondary | ICD-10-CM | POA: Insufficient documentation

## 2016-12-12 DIAGNOSIS — E66811 Obesity, class 1: Secondary | ICD-10-CM | POA: Insufficient documentation

## 2016-12-12 DIAGNOSIS — K219 Gastro-esophageal reflux disease without esophagitis: Secondary | ICD-10-CM

## 2016-12-12 DIAGNOSIS — Z7189 Other specified counseling: Secondary | ICD-10-CM

## 2016-12-12 DIAGNOSIS — Z6830 Body mass index (BMI) 30.0-30.9, adult: Secondary | ICD-10-CM | POA: Insufficient documentation

## 2016-12-12 DIAGNOSIS — E785 Hyperlipidemia, unspecified: Secondary | ICD-10-CM | POA: Diagnosis not present

## 2016-12-12 DIAGNOSIS — N183 Chronic kidney disease, stage 3 unspecified: Secondary | ICD-10-CM

## 2016-12-12 DIAGNOSIS — E669 Obesity, unspecified: Secondary | ICD-10-CM | POA: Insufficient documentation

## 2016-12-12 MED ORDER — OMEPRAZOLE 40 MG PO CPDR: 40.0000 mg | DELAYED_RELEASE_CAPSULE | Freq: Every day | ORAL | 11 refills | Status: DC

## 2016-12-12 NOTE — Assessment & Plan Note (Signed)
PSA remains stable. Defer DRE to next year.

## 2016-12-12 NOTE — Assessment & Plan Note (Signed)
Chronic, continue metformin. Discussed possible diarrhea related to metformin and options to decrease dose and add 2nd antihyperglycemic - pt desires to continue as is for now.

## 2016-12-12 NOTE — Patient Instructions (Addendum)
You are doing well today.  Work on Scientist, physiological.  Continue working on regular walking routine for goal weight loss.  Return as needed or in 6 months for diabetes follow up visit.   Health Maintenance, Male A healthy lifestyle and preventive care is important for your health and wellness. Ask your health care provider about what schedule of regular examinations is right for you. What should I know about weight and diet?  Eat a Healthy Diet  Eat plenty of vegetables, fruits, whole grains, low-fat dairy products, and lean protein.  Do not eat a lot of foods high in solid fats, added sugars, or salt. Maintain a Healthy Weight  Regular exercise can help you achieve or maintain a healthy weight. You should:  Do at least 150 minutes of exercise each week. The exercise should increase your heart rate and make you sweat (moderate-intensity exercise).  Do strength-training exercises at least twice a week. Watch Your Levels of Cholesterol and Blood Lipids  Have your blood tested for lipids and cholesterol every 5 years starting at 75 years of age. If you are at high risk for heart disease, you should start having your blood tested when you are 75 years old. You may need to have your cholesterol levels checked more often if:  Your lipid or cholesterol levels are high.  You are older than 75 years of age.  You are at high risk for heart disease. What should I know about cancer screening? Many types of cancers can be detected early and may often be prevented. Lung Cancer  You should be screened every year for lung cancer if:  You are a current smoker who has smoked for at least 30 years.  You are a former smoker who has quit within the past 15 years.  Talk to your health care provider about your screening options, when you should start screening, and how often you should be screened. Colorectal Cancer  Routine colorectal cancer screening usually begins at 75 years of age and should  be repeated every 5-10 years until you are 75 years old. You may need to be screened more often if early forms of precancerous polyps or small growths are found. Your health care provider may recommend screening at an earlier age if you have risk factors for colon cancer.  Your health care provider may recommend using home test kits to check for hidden blood in the stool.  A small camera at the end of a tube can be used to examine your colon (sigmoidoscopy or colonoscopy). This checks for the earliest forms of colorectal cancer. Prostate and Testicular Cancer  Depending on your age and overall health, your health care provider may do certain tests to screen for prostate and testicular cancer.  Talk to your health care provider about any symptoms or concerns you have about testicular or prostate cancer. Skin Cancer  Check your skin from head to toe regularly.  Tell your health care provider about any new moles or changes in moles, especially if:  There is a change in a mole's size, shape, or color.  You have a mole that is larger than a pencil eraser.  Always use sunscreen. Apply sunscreen liberally and repeat throughout the day.  Protect yourself by wearing long sleeves, pants, a wide-brimmed hat, and sunglasses when outside. What should I know about heart disease, diabetes, and high blood pressure?  If you are 62-39 years of age, have your blood pressure checked every 3-5 years. If you are 40  years of age or older, have your blood pressure checked every year. You should have your blood pressure measured twice-once when you are at a hospital or clinic, and once when you are not at a hospital or clinic. Record the average of the two measurements. To check your blood pressure when you are not at a hospital or clinic, you can use:  An automated blood pressure machine at a pharmacy.  A home blood pressure monitor.  Talk to your health care provider about your target blood pressure.  If  you are between 31-83 years old, ask your health care provider if you should take aspirin to prevent heart disease.  Have regular diabetes screenings by checking your fasting blood sugar level.  If you are at a normal weight and have a low risk for diabetes, have this test once every three years after the age of 26.  If you are overweight and have a high risk for diabetes, consider being tested at a younger age or more often.  A one-time screening for abdominal aortic aneurysm (AAA) by ultrasound is recommended for men aged 27-75 years who are current or former smokers. What should I know about preventing infection? Hepatitis B  If you have a higher risk for hepatitis B, you should be screened for this virus. Talk with your health care provider to find out if you are at risk for hepatitis B infection. Hepatitis C  Blood testing is recommended for:  Everyone born from [birth year] through 1964.  Anyone with known risk factors for hepatitis C. Sexually Transmitted Diseases (STDs)  You should be screened each year for STDs including gonorrhea and chlamydia if:  You are sexually active and are younger than 75 years of age.  You are older than 75 years of age and your health care provider tells you that you are at risk for this type of infection.  Your sexual activity has changed since you were last screened and you are at an increased risk for chlamydia or gonorrhea. Ask your health care provider if you are at risk.  Talk with your health care provider about whether you are at high risk of being infected with HIV. Your health care provider may recommend a prescription medicine to help prevent HIV infection. What else can I do?  Schedule regular health, dental, and eye exams.  Stay current with your vaccines (immunizations).  Do not use any tobacco products, such as cigarettes, chewing tobacco, and e-cigarettes. If you need help quitting, ask your health care provider.  Limit alcohol intake  to no more than 2 drinks per day. One drink equals 12 ounces of beer, 5 ounces of wine, or 1 ounces of hard liquor.  Do not use street drugs.  Do not share needles.  Ask your health care provider for help if you need support or information about quitting drugs.  Tell your health care provider if you often feel depressed.  Tell your health care provider if you have ever been abused or do not feel safe at home. This information is not intended to replace advice given to you by your health care provider. Make sure you discuss any questions you have with your health care provider. Document Released: 02/24/2008 Document Revised: 04/26/2016 Document Reviewed: 06/01/2015 Elsevier Interactive Patient Education  2017 [employer].

## 2016-12-12 NOTE — Progress Notes (Signed)
BP (!) 142/76 (BP Location: Left Arm, Patient Position: Sitting, Cuff Size: Large)   Pulse 65   Temp 98 F (36.7 C) (Oral)   Ht 5' 9.5" (1.765 m)   Wt 276 lb 8 oz (125.4 kg)   SpO2 98%   BMI 40.25 kg/m    CC: CPE Subjective:    Patient ID: [name] Young., male    DOB: 1941/01/17, 76 y.o.   MRN: [id number]  HPI: [name] Young. is a 76 y.o. male presenting on 12/12/2016 for Annual Exam   Saw Katha Cabal 10/2016 for medicare wellness visit. Note reviewed.   Some unsteadiness attributed to periph neuropathy  Preventative: Prostate - yearly check - PSA normal. Requests DRE next year.  COLONOSCOPY Date: 12/2013 3 polyps, diverticulosis, rec rpt 3 yrs Ardis Hughs). Pt wants to wait until 12/2017.  Flu shot - yearly  Pneumovax 09/2011, prevnar 04/2014 Td 09/2011 Shingles shot - declines  Advanced directive: would want wife to be HCPOA. Does not want prolonged life support. Wants to hire lawyer.  Seat belt use discussed Sunscreen use discussed. No changing moles on skin. 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  Diet: some water, fruits/vegetables daily, avoids potatoes   Relevant past medical, surgical, family and social history reviewed and updated as indicated. Interim medical history since our last visit reviewed. Allergies and medications reviewed and updated. Outpatient Medications Prior to Visit  Medication Sig Dispense Refill  . aspirin 325 MG tablet Take 325 mg by mouth daily.    . Cholecalciferol (VITAMIN D) 2000 UNITS CAPS Take 1 capsule by mouth daily.    . fenofibrate (TRICOR) 145 MG tablet TAKE 1 TABLET BY MOUTH DAILY 30 tablet 11  . Glucosamine-Chondroitin (OSTEO BI-FLEX REGULAR STRENGTH PO) Take 2 tablets by mouth daily.    Marland Kitchen lisinopril (PRINIVIL,ZESTRIL) 20 MG tablet TAKE 1 TABLET BY MOUTH DAILY 90 tablet 1  . metFORMIN  (GLUCOPHAGE) 1000 MG tablet TAKE 1 TABLET BY MOUTH TWICE A DAY WITH A MEAL 180 tablet 1  . methocarbamol (ROBAXIN) 500 MG tablet Take 1 tablet (500 mg total) by mouth 2 (two) times daily as needed for muscle spasms. 30 tablet 0  . Multiple Vitamins-Minerals (MACULAR VITAMIN BENEFIT PO) Take 1 tablet by mouth daily.    . Omega-3 Fatty Acids (FISH OIL) 1200 MG CAPS Take 1 capsule by mouth 3 (three) times daily.    . simvastatin (ZOCOR) 20 MG tablet TAKE ONE TABLET BY MOUTH EVERY NIGHT AT BEDTIME 90 tablet 3  . omeprazole (PRILOSEC) 40 MG capsule TAKE ONE CAPSULE BY MOUTH DAILY FOR 3 WEEKS  THEN AS NEEDED FOR GERD 30 capsule 3   No facility-administered medications prior to visit.      Per HPI unless specifically indicated in ROS section below Review of Systems  Constitutional: Negative for activity change, appetite change, chills, fatigue, fever and unexpected weight change.  HENT: Negative for hearing loss.   Eyes: Negative for visual disturbance.  Respiratory: Negative for cough, chest tightness, shortness of breath and wheezing.   Cardiovascular: Negative for chest pain, palpitations and leg swelling.  Gastrointestinal: Positive for diarrhea (metformin related - declines change at this time.). Negative for abdominal distention, abdominal pain, blood in stool, constipation, nausea and vomiting.  Genitourinary: Negative for difficulty urinating and hematuria.  Musculoskeletal: Negative for arthralgias, myalgias and neck pain.  Skin: Negative  for rash.  Neurological: Negative for dizziness, seizures, syncope and headaches.  Hematological: Negative for adenopathy. Does not bruise/bleed easily.  Psychiatric/Behavioral: Negative for dysphoric mood. The patient is not nervous/anxious.        Objective:    BP (!) 142/76 (BP Location: Left Arm, Patient Position: Sitting, Cuff Size: Large)   Pulse 65   Temp 98 F (36.7 C) (Oral)   Ht 5' 9.5" (1.765 m)   Wt 276 lb 8 oz (125.4 kg)   SpO2 98%    BMI 40.25 kg/m   Wt Readings from Last 3 Encounters:  12/12/16 276 lb 8 oz (125.4 kg)  11/06/16 276 lb 8 oz (125.4 kg)  05/05/16 272 lb 12 oz (123.7 kg)    Physical Exam  Constitutional: He is oriented to person, place, and time. He appears well-developed and well-nourished. No distress.  HENT:  Head: Normocephalic and atraumatic.  Right Ear: Hearing, tympanic membrane, external ear and ear canal normal.  Left Ear: Hearing, tympanic membrane, external ear and ear canal normal.  Nose: Nose normal.  Mouth/Throat: Uvula is midline, oropharynx is clear and moist and mucous membranes are normal. No oropharyngeal exudate, posterior oropharyngeal edema or posterior oropharyngeal erythema.  Eyes: Conjunctivae and EOM are normal. Pupils are equal, round, and reactive to light. No scleral icterus.  Neck: Normal range of motion. Neck supple. Carotid bruit is not present. No thyromegaly present.  Cardiovascular: Normal rate, regular rhythm, normal heart sounds and intact distal pulses.   No murmur heard. Pulses:      Radial pulses are 2+ on the right side, and 2+ on the left side.  Pulmonary/Chest: Effort normal and breath sounds normal. No respiratory distress. He has no wheezes. He has no rales.  Abdominal: Soft. Bowel sounds are normal. He exhibits no distension and no mass. There is no tenderness. There is no rebound and no guarding.  Genitourinary: Rectum normal. Rectal exam shows no external hemorrhoid, no internal hemorrhoid, no fissure, no mass, no tenderness and anal tone normal. Prostate is not enlarged and not tender.  Musculoskeletal: Normal range of motion. He exhibits no edema.  Lymphadenopathy:    He has no cervical adenopathy.  Neurological: He is alert and oriented to person, place, and time.  CN grossly intact, station and gait intact  Skin: Skin is warm and dry. No rash noted.  Psychiatric: He has a normal mood and affect. His behavior is normal. Judgment and thought content  normal.  Nursing note and vitals reviewed.  Results for orders placed or performed in visit on 11/06/16  Lipid panel  Result Value Ref Range   Cholesterol 129 0 - 200 mg/dL   Triglycerides 210.0 (H) 0.0 - 149.0 mg/dL   HDL 28.60 (L) >39.00 mg/dL   VLDL 42.0 (H) 0.0 - 40.0 mg/dL   Total CHOL/HDL Ratio 5    NonHDL 100.85   Comprehensive metabolic panel  Result Value Ref Range   Sodium 145 135 - 145 mEq/L   Potassium 4.7 3.5 - 5.1 mEq/L   Chloride 108 96 - 112 mEq/L   CO2 28 19 - 32 mEq/L   Glucose, Bld 126 (H) 70 - 99 mg/dL   BUN 19 6 - 23 mg/dL   Creatinine, Ser 1.28 0.40 - 1.50 mg/dL   Total Bilirubin 0.6 0.2 - 1.2 mg/dL   Alkaline Phosphatase 26 (L) 39 - 117 U/L   AST 30 0 - 37 U/L   ALT 30 0 - 53 U/L   Total Protein 6.3  6.0 - 8.3 g/dL   Albumin 3.9 3.5 - 5.2 g/dL   Calcium 9.3 8.4 - 10.5 mg/dL   GFR 58.33 (L) >60.00 mL/min  Hemoglobin A1c  Result Value Ref Range   Hgb A1c MFr Bld 6.6 (H) 4.6 - 6.5 %  CBC with Differential/Platelet  Result Value Ref Range   WBC 6.9 4.0 - 10.5 K/uL   RBC 4.21 (L) 4.22 - 5.81 Mil/uL   Hemoglobin 12.6 (L) 13.0 - 17.0 g/dL   HCT 38.5 (L) 39.0 - 52.0 %   MCV 91.4 78.0 - 100.0 fl   MCHC 32.7 30.0 - 36.0 g/dL   RDW 15.1 11.5 - 15.5 %   Platelets 226.0 150.0 - 400.0 K/uL   Neutrophils Relative % 59.3 43.0 - 77.0 %   Lymphocytes Relative 31.3 12.0 - 46.0 %   Monocytes Relative 6.9 3.0 - 12.0 %   Eosinophils Relative 2.1 0.0 - 5.0 %   Basophils Relative 0.4 0.0 - 3.0 %   Neutro Abs 4.1 1.4 - 7.7 K/uL   Lymphs Abs 2.1 0.7 - 4.0 K/uL   Monocytes Absolute 0.5 0.1 - 1.0 K/uL   Eosinophils Absolute 0.1 0.0 - 0.7 K/uL   Basophils Absolute 0.0 0.0 - 0.1 K/uL  VITAMIN D 25 Hydroxy (Vit-D Deficiency, Fractures)  Result Value Ref Range   VITD 39.53 30.00 - 100.00 ng/mL  PSA, Medicare  Result Value Ref Range   PSA 0.66 0.10 - 4.00 ng/ml  LDL cholesterol, direct  Result Value Ref Range   Direct LDL 64.0 mg/dL      Assessment & Plan:    Problem List Items Addressed This Visit    Advanced care planning/counseling discussion    Advanced directive: would want wife to be HCPOA. Does not want prolonged life support. Wants to hire lawyer.       Benign prostatic hyperplasia    PSA remains stable. Defer DRE to next year.       Dyslipidemia    Chronic. Trig elevation remains. Reviewed benefits of weight loss to help control cholesterol.       GERD (gastroesophageal reflux disease)    Marked improvement on omeprazole 40mg  daily. Reviewed dosing, rec trial QOD dosing.      Relevant Medications   omeprazole (PRILOSEC) 40 MG capsule   Health maintenance examination - Primary    Preventative protocols reviewed and updated unless pt declined. Discussed healthy diet and lifestyle.       HTN (hypertension)    Chronic, adequate. Continue lisinopril 20mg  daily.       Obesity, Class III, BMI 40-49.9 (morbid obesity) ([place])    Discussed healthy diet and lifestyle changes to affect sustainable weight loss.       Stage 3 chronic kidney disease due to diabetes mellitus (HCC)    Chronic. Improved readings with increased water intake.       Type 2 diabetes, controlled, with neuropathy (HCC)    Chronic, continue metformin. Discussed possible diarrhea related to metformin and options to decrease dose and add 2nd antihyperglycemic - pt desires to continue as is for now.           Follow up plan: Return in about 6 months (around 06/13/2017) for follow up visit.  Ria Bush, MD

## 2016-12-12 NOTE — Assessment & Plan Note (Signed)
Chronic. Improved readings with increased water intake.

## 2016-12-12 NOTE — Assessment & Plan Note (Signed)
Preventative protocols reviewed and updated unless pt declined. Discussed healthy diet and lifestyle.  

## 2016-12-12 NOTE — Assessment & Plan Note (Signed)
Chronic, adequate. Continue lisinopril 20mg  daily.

## 2016-12-12 NOTE — Assessment & Plan Note (Signed)
Chronic. Trig elevation remains. Reviewed benefits of weight loss to help control cholesterol.

## 2016-12-12 NOTE — Assessment & Plan Note (Addendum)
Advanced directive: would want wife to be HCPOA. Does not want prolonged life support. Wants to hire lawyer.

## 2016-12-12 NOTE — Assessment & Plan Note (Signed)
Marked improvement on omeprazole 40mg  daily. Reviewed dosing, rec trial QOD dosing.

## 2016-12-12 NOTE — Progress Notes (Signed)
Pre visit review using our clinic review tool, if applicable. No additional management support is needed unless otherwise documented below in the visit note. 

## 2016-12-12 NOTE — Assessment & Plan Note (Signed)
Discussed healthy diet and lifestyle changes to affect sustainable weight loss  

## 2017-03-11 LAB — HM DIABETES EYE EXAM

## 2017-04-02 ENCOUNTER — Ambulatory Visit: Payer: Medicare Other | Admitting: Family Medicine

## 2017-04-02 ENCOUNTER — Ambulatory Visit (INDEPENDENT_AMBULATORY_CARE_PROVIDER_SITE_OTHER): Payer: Medicare Other | Admitting: Family Medicine

## 2017-04-02 ENCOUNTER — Encounter: Payer: Self-pay | Admitting: Family Medicine

## 2017-04-02 DIAGNOSIS — B356 Tinea cruris: Secondary | ICD-10-CM | POA: Diagnosis not present

## 2017-04-02 MED ORDER — NYSTATIN 100000 UNIT/GM EX POWD: Freq: Three times a day (TID) | CUTANEOUS | 0 refills | Status: DC

## 2017-04-02 NOTE — Patient Instructions (Signed)
Use nystatin powder 3 times a day.  Take claritin 10mg  a day for itching.  If needed use a small amount of OTC hydrocortisone cream but allow that to dry in before using nystatin.  Take care.  Glad to see you.

## 2017-04-02 NOTE — Progress Notes (Signed)
Rash.  He has chiggers in the groin and B ankles about 2 weeks ago.  Then he thought he got a fungal infection in the meantime.  Last A1c controlled, he doesn't check his sugar at home.  No fevers.  Itchy rash.  Started using lotrimin spray with mild improvement recently  Meds, vitals, and allergies reviewed.   ROS: Per HPI unless specifically indicated in ROS section   nad B, R>L, inguinal rash, also on the scrotum, similar rash in the upper gluteal crease

## 2017-04-02 NOTE — Assessment & Plan Note (Signed)
Not severe, doesn't appear super infected.  D/w pt.  Use nystatin powder 3 times a day.  Take claritin 10mg  a day for itching.  If needed use a small amount of OTC hydrocortisone cream but allow that to dry in before using nystatin.  He agrees.

## 2017-04-04 ENCOUNTER — Other Ambulatory Visit: Payer: Self-pay | Admitting: Family Medicine

## 2017-04-11 ENCOUNTER — Other Ambulatory Visit: Payer: Self-pay | Admitting: Family Medicine

## 2017-05-30 ENCOUNTER — Other Ambulatory Visit: Payer: Self-pay | Admitting: Family Medicine

## 2017-06-10 ENCOUNTER — Other Ambulatory Visit: Payer: Self-pay | Admitting: Family Medicine

## 2017-06-10 DIAGNOSIS — E785 Hyperlipidemia, unspecified: Secondary | ICD-10-CM

## 2017-06-10 DIAGNOSIS — E1140 Type 2 diabetes mellitus with diabetic neuropathy, unspecified: Secondary | ICD-10-CM

## 2017-06-11 ENCOUNTER — Other Ambulatory Visit (INDEPENDENT_AMBULATORY_CARE_PROVIDER_SITE_OTHER): Payer: Medicare Other

## 2017-06-11 DIAGNOSIS — E1140 Type 2 diabetes mellitus with diabetic neuropathy, unspecified: Secondary | ICD-10-CM

## 2017-06-11 DIAGNOSIS — E785 Hyperlipidemia, unspecified: Secondary | ICD-10-CM

## 2017-06-11 LAB — LIPID PANEL
Cholesterol: 138 mg/dL (ref 0–200)
HDL: 24.4 mg/dL — AB (ref >39.00)
NONHDL: 113.74
Total CHOL/HDL Ratio: 6
Triglycerides: 308 mg/dL — ABNORMAL HIGH (ref 0.0–149.0)
VLDL: 61.6 mg/dL — ABNORMAL HIGH (ref 0.0–40.0)

## 2017-06-11 LAB — LDL CHOLESTEROL, DIRECT: Direct LDL: 67 mg/dL

## 2017-06-11 LAB — HEMOGLOBIN A1C: Hgb A1c MFr Bld: 7.2 % — ABNORMAL HIGH (ref 4.6–6.5)

## 2017-06-11 LAB — COMPREHENSIVE METABOLIC PANEL
ALK PHOS: 25 U/L — AB (ref 39–117)
ALT: 35 U/L (ref 0–53)
AST: 36 U/L (ref 0–37)
Albumin: 3.9 g/dL (ref 3.5–5.2)
BUN: 24 mg/dL — AB (ref 6–23)
CO2: 25 mEq/L (ref 19–32)
Calcium: 9.7 mg/dL (ref 8.4–10.5)
Chloride: 108 mEq/L (ref 96–112)
Creatinine, Ser: 1.38 mg/dL (ref 0.40–1.50)
GFR: 53.39 mL/min — ABNORMAL LOW (ref >60.00)
GLUCOSE: 151 mg/dL — AB (ref 70–99)
POTASSIUM: 4.4 meq/L (ref 3.5–5.1)
SODIUM: 143 meq/L (ref 135–145)
TOTAL PROTEIN: 6.4 g/dL (ref 6.0–8.3)
Total Bilirubin: 0.6 mg/dL (ref 0.2–1.2)

## 2017-06-13 ENCOUNTER — Ambulatory Visit: Payer: Medicare Other | Admitting: Family Medicine

## 2017-06-14 ENCOUNTER — Ambulatory Visit (INDEPENDENT_AMBULATORY_CARE_PROVIDER_SITE_OTHER): Payer: Medicare Other | Admitting: Family Medicine

## 2017-06-14 ENCOUNTER — Encounter: Payer: Self-pay | Admitting: Family Medicine

## 2017-06-14 VITALS — BP 150/80 | HR 65 | Temp 97.9°F | Wt 282.2 lb

## 2017-06-14 DIAGNOSIS — N183 Chronic kidney disease, stage 3 unspecified: Secondary | ICD-10-CM

## 2017-06-14 DIAGNOSIS — I10 Essential (primary) hypertension: Secondary | ICD-10-CM

## 2017-06-14 DIAGNOSIS — Z66 Do not resuscitate: Secondary | ICD-10-CM | POA: Insufficient documentation

## 2017-06-14 DIAGNOSIS — E1140 Type 2 diabetes mellitus with diabetic neuropathy, unspecified: Secondary | ICD-10-CM | POA: Diagnosis not present

## 2017-06-14 DIAGNOSIS — Z7189 Other specified counseling: Secondary | ICD-10-CM

## 2017-06-14 DIAGNOSIS — G6289 Other specified polyneuropathies: Secondary | ICD-10-CM | POA: Diagnosis not present

## 2017-06-14 DIAGNOSIS — G629 Polyneuropathy, unspecified: Secondary | ICD-10-CM | POA: Insufficient documentation

## 2017-06-14 DIAGNOSIS — E1122 Type 2 diabetes mellitus with diabetic chronic kidney disease: Secondary | ICD-10-CM | POA: Diagnosis not present

## 2017-06-14 DIAGNOSIS — R197 Diarrhea, unspecified: Secondary | ICD-10-CM | POA: Insufficient documentation

## 2017-06-14 MED ORDER — METFORMIN HCL 500 MG PO TABS
500.0000 mg | ORAL_TABLET | Freq: Two times a day (BID) | ORAL | 3 refills | Status: DC
Start: 2017-06-14 — End: 2017-12-21

## 2017-06-14 MED ORDER — SITAGLIPTIN PHOSPHATE 50 MG PO TABS: 50.0000 mg | ORAL_TABLET | Freq: Every day | ORAL | 11 refills | Status: DC

## 2017-06-14 NOTE — Assessment & Plan Note (Signed)
Consider labwork next visit to further evaluate this.

## 2017-06-14 NOTE — Assessment & Plan Note (Signed)
Chronic, stable readings.

## 2017-06-14 NOTE — Assessment & Plan Note (Addendum)
Chronic, may not be tolerating metformin - will decrease to 500mg  bid and add januvia 50mg  daily.  Declines DSME.  Concern for progressive neuropathy - declines gabapentin.

## 2017-06-14 NOTE — Patient Instructions (Addendum)
Pass by lab for stool test to rule out infection.  Decrease metformin to 500mg  twice daily - new dost at pharmacy. Start [place] 50mg  daily as well.  You have progressive neuropathy.  Return in 4 months for follow up visit.

## 2017-06-14 NOTE — Assessment & Plan Note (Signed)
Ongoing presumed due to metformin, will decrease does. Given watery nature of diarrhea, check C diff.

## 2017-06-14 NOTE — Progress Notes (Signed)
BP (!) 150/80 (BP Location: Right Arm, Cuff Size: Large)   Pulse 65   Temp 97.9 F (36.6 C) (Oral)   Wt 282 lb 4 oz (128 kg)   SpO2 97%   BMI 41.08 kg/m    CC: 6 mo f/u visit Subjective:    Patient ID: [name] Young., male    DOB: March 24, 1941, 76 y.o.   MRN: [id number]  HPI: [place] Clasby. is a 76 y.o. male presenting on 06/14/2017 for 6 mo follow-up   HTN - compliant with lisinopril 20mg  daily. Avoids salt. No HA, vision changes, CP/tightness, SOB, leg swelling.   DM - does not regularly check sugars. Compliant with antihyperglycemic regimen which includes: metformin 1000mg  bid. Denies low sugars or hypoglycemic symptoms. First stool of the day is normal, then ongoing watery diarrhea rest of the day for last 3 months. + stool urgency. No fevers/chills, abd pain. Known neuropathy. Declines gabapentin. Last diabetic eye exam DUE. Pneumovax: 2013. Prevnar: 2015. Glucometer brand: unsure. DSME: declines. Lab Results  Component Value Date   HGBA1C 7.2 (H) 06/11/2017   Diabetic Foot Exam - Simple   Simple Foot Form Diabetic Foot exam was performed with the following findings:  Yes 06/14/2017  8:28 AM  Visual Inspection No deformities, no ulcerations, no other skin breakdown bilaterally:  Yes Sensation Testing See comments:  Yes Pulse Check Posterior Tibialis and Dorsalis pulse intact bilaterally:  Yes Comments Progressive neuropathy - decreased monofilament to testing up to mid calf    Lab Results  Component Value Date   MICROALBUR 1.4 04/29/2014     He would like DNR form.   Relevant past medical, surgical, family and social history reviewed and updated as indicated. Interim medical history since our last visit reviewed. Allergies and medications reviewed and updated. Outpatient Medications Prior to Visit  Medication Sig Dispense Refill  . aspirin 325 MG tablet Take 325 mg by mouth daily.    . Cholecalciferol (VITAMIN D) 2000 UNITS CAPS Take 1 capsule by  mouth daily.    . fenofibrate (TRICOR) 145 MG tablet TAKE 1 TABLET BY MOUTH DAILY 30 tablet 0  . Glucosamine-Chondroitin (OSTEO BI-FLEX REGULAR STRENGTH PO) Take 2 tablets by mouth daily.    Marland Kitchen lisinopril (PRINIVIL,ZESTRIL) 20 MG tablet TAKE 1 TABLET BY MOUTH DAILY 90 tablet 3  . methocarbamol (ROBAXIN) 500 MG tablet Take 1 tablet (500 mg total) by mouth 2 (two) times daily as needed for muscle spasms. 30 tablet 0  . Multiple Vitamins-Minerals (MACULAR VITAMIN BENEFIT PO) Take 1 tablet by mouth daily.    Marland Kitchen nystatin (MYCOSTATIN/NYSTOP) powder Apply topically 3 (three) times daily. 30 g 0  . Omega-3 Fatty Acids (FISH OIL) 1200 MG CAPS Take 1 capsule by mouth 3 (three) times daily.    Marland Kitchen omeprazole (PRILOSEC) 40 MG capsule Take 1 capsule (40 mg total) by mouth daily. 30 capsule 11  . simvastatin (ZOCOR) 20 MG tablet TAKE ONE TABLET BY MOUTH EVERY NIGHT AT BEDTIME 90 tablet 3  . metFORMIN (GLUCOPHAGE) 1000 MG tablet TAKE ONE TABLET BY MOUTH TWICE DAILY WITH FOOD 180 tablet 1   No facility-administered medications prior to visit.      Per HPI unless specifically indicated in ROS section below Review of Systems     Objective:    BP (!) 150/80 (BP Location: Right Arm, Cuff Size: Large)   Pulse 65   Temp 97.9 F (36.6 C) (Oral)   Wt 282 lb 4 oz (128 kg)  SpO2 97%   BMI 41.08 kg/m   Wt Readings from Last 3 Encounters:  06/14/17 282 lb 4 oz (128 kg)  04/02/17 286 lb (129.7 kg)  12/12/16 276 lb 8 oz (125.4 kg)    Physical Exam  Constitutional: He appears well-developed and well-nourished. No distress.  HENT:  Head: Normocephalic and atraumatic.  Right Ear: External ear normal.  Left Ear: External ear normal.  Nose: Nose normal.  Mouth/Throat: Oropharynx is clear and moist. No oropharyngeal exudate.  Eyes: Pupils are equal, round, and reactive to light. Conjunctivae and EOM are normal. No scleral icterus.  Neck: Normal range of motion. Neck supple.  Cardiovascular: Normal rate, regular  rhythm, normal heart sounds and intact distal pulses.   No murmur heard. Pulmonary/Chest: Effort normal and breath sounds normal. No respiratory distress. He has no wheezes. He has no rales.  Musculoskeletal: He exhibits no edema.  See HPI for foot exam if done  Lymphadenopathy:    He has no cervical adenopathy.  Skin: Skin is warm and dry. No rash noted.  Psychiatric: He has a normal mood and affect.  Nursing note and vitals reviewed.  Results for orders placed or performed in visit on 06/14/17  HM DIABETES EYE EXAM  Result Value Ref Range   HM Diabetic Eye Exam No Retinopathy No Retinopathy      Assessment & Plan:   Problem List Items Addressed This Visit    Advanced care planning/counseling discussion    States daughter is HCPOA.       Diarrhea    Ongoing presumed due to metformin, will decrease does. Given watery nature of diarrhea, check C diff.       Relevant Orders   C. difficile GDH and Toxin A/B   DNR (do not resuscitate)    DNR order form filled out today. Reviewed with patient and provided him with form.       HTN (hypertension)    Chronic, adequate control. Continue lisinopril.       Peripheral neuropathy    Consider labwork next visit to further evaluate this.       Stage 3 chronic kidney disease due to diabetes mellitus (HCC)    Chronic, stable readings.       Relevant Medications   metFORMIN (GLUCOPHAGE) 500 MG tablet   sitaGLIPtin (JANUVIA) 50 MG tablet   Type 2 diabetes, controlled, with neuropathy ([place]) - Primary    Chronic, may not be tolerating metformin - will decrease to 500mg  bid and add januvia 50mg  daily.  Declines DSME.  Concern for progressive neuropathy - declines gabapentin.       Relevant Medications   metFORMIN (GLUCOPHAGE) 500 MG tablet   sitaGLIPtin (JANUVIA) 50 MG tablet       Follow up plan: Return in about 4 months (around 10/15/2017) for follow up visit.  Ria Bush, MD

## 2017-06-14 NOTE — Assessment & Plan Note (Signed)
Chronic, adequate control. Continue lisinopril.

## 2017-06-14 NOTE — Assessment & Plan Note (Signed)
States daughter is Economist.

## 2017-06-14 NOTE — Assessment & Plan Note (Signed)
DNR order form filled out today. Reviewed with patient and provided him with form.

## 2017-06-15 LAB — C. DIFFICILE GDH AND TOXIN A/B
GDH ANTIGEN: NOT DETECTED
MICRO NUMBER:: 81104291
SPECIMEN QUALITY:: ADEQUATE
TOXIN A AND B: NOT DETECTED

## 2017-07-08 ENCOUNTER — Other Ambulatory Visit: Payer: Self-pay | Admitting: Family Medicine

## 2017-10-15 ENCOUNTER — Telehealth: Payer: Self-pay | Admitting: Family Medicine

## 2017-10-15 NOTE — Telephone Encounter (Signed)
Called pt to schedule AWV. Please call office to schedule pt after 04/3 and ask for [facility].

## 2017-11-05 ENCOUNTER — Ambulatory Visit (INDEPENDENT_AMBULATORY_CARE_PROVIDER_SITE_OTHER)
Admission: RE | Admit: 2017-11-05 | Discharge: 2017-11-05 | Disposition: A | Discharge status: Home / Self Care | Payer: Medicare Other | Source: Ambulatory Visit | Attending: Primary Care | Admitting: Primary Care

## 2017-11-05 ENCOUNTER — Encounter: Payer: Self-pay | Admitting: Primary Care

## 2017-11-05 ENCOUNTER — Other Ambulatory Visit: Payer: Self-pay | Admitting: Primary Care

## 2017-11-05 ENCOUNTER — Ambulatory Visit: Payer: Medicare Other | Admitting: Primary Care

## 2017-11-05 ENCOUNTER — Ambulatory Visit: Payer: Self-pay | Admitting: *Deleted

## 2017-11-05 VITALS — BP 170/92 | HR 59 | Temp 98.2°F | Ht 69.5 in | Wt 286.2 lb

## 2017-11-05 DIAGNOSIS — E1122 Type 2 diabetes mellitus with diabetic chronic kidney disease: Secondary | ICD-10-CM | POA: Diagnosis not present

## 2017-11-05 DIAGNOSIS — N400 Enlarged prostate without lower urinary tract symptoms: Secondary | ICD-10-CM | POA: Diagnosis not present

## 2017-11-05 DIAGNOSIS — I10 Essential (primary) hypertension: Secondary | ICD-10-CM

## 2017-11-05 DIAGNOSIS — N183 Chronic kidney disease, stage 3 (moderate): Secondary | ICD-10-CM | POA: Diagnosis not present

## 2017-11-05 DIAGNOSIS — R0602 Shortness of breath: Secondary | ICD-10-CM | POA: Insufficient documentation

## 2017-11-05 DIAGNOSIS — R0600 Dyspnea, unspecified: Secondary | ICD-10-CM | POA: Insufficient documentation

## 2017-11-05 DIAGNOSIS — R350 Frequency of micturition: Secondary | ICD-10-CM | POA: Diagnosis not present

## 2017-11-05 DIAGNOSIS — K219 Gastro-esophageal reflux disease without esophagitis: Secondary | ICD-10-CM | POA: Diagnosis not present

## 2017-11-05 DIAGNOSIS — E1140 Type 2 diabetes mellitus with diabetic neuropathy, unspecified: Secondary | ICD-10-CM | POA: Diagnosis not present

## 2017-11-05 LAB — POC URINALSYSI DIPSTICK (AUTOMATED)
Bilirubin, UA: NEGATIVE
Blood, UA: NEGATIVE
Glucose, UA: NEGATIVE
Ketones, UA: NEGATIVE
Leukocytes, UA: NEGATIVE
Nitrite, UA: NEGATIVE
Protein, UA: NEGATIVE
Spec Grav, UA: 1.025
Urobilinogen, UA: 1 U/dL
pH, UA: 6

## 2017-11-05 LAB — COMPREHENSIVE METABOLIC PANEL
ALK PHOS: 29 U/L — AB (ref 39–117)
ALT: 18 U/L (ref 0–53)
AST: 20 U/L (ref 0–37)
Albumin: 4.1 g/dL (ref 3.5–5.2)
BILIRUBIN TOTAL: 0.6 mg/dL (ref 0.2–1.2)
BUN: 22 mg/dL (ref 6–23)
CALCIUM: 10.2 mg/dL (ref 8.4–10.5)
CO2: 30 meq/L (ref 19–32)
Chloride: 103 mEq/L (ref 96–112)
Creatinine, Ser: 1.42 mg/dL (ref 0.40–1.50)
GFR: 51.61 mL/min — ABNORMAL LOW (ref >60.00)
GLUCOSE: 106 mg/dL — AB (ref 70–99)
POTASSIUM: 4.4 meq/L (ref 3.5–5.1)
Sodium: 141 mEq/L (ref 135–145)
TOTAL PROTEIN: 6.9 g/dL (ref 6.0–8.3)

## 2017-11-05 LAB — HEMOGLOBIN A1C: HEMOGLOBIN A1C: 6.8 % — AB (ref 4.6–6.5)

## 2017-11-05 LAB — TROPONIN I: TNIDX: 0 ug/L (ref 0.00–0.06)

## 2017-11-05 LAB — BRAIN NATRIURETIC PEPTIDE: Pro B Natriuretic peptide (BNP): 158 pg/mL — ABNORMAL HIGH (ref 0.0–100.0)

## 2017-11-05 LAB — PSA: PSA: 0.83 ng/mL (ref 0.10–4.00)

## 2017-11-05 MED ORDER — DOXAZOSIN MESYLATE 1 MG PO TABS: 1.0000 mg | ORAL_TABLET | Freq: Every day | ORAL | 0 refills | Status: DC

## 2017-11-05 MED ORDER — RANITIDINE HCL 150 MG PO TABS: 150.0000 mg | ORAL_TABLET | Freq: Every day | ORAL | 0 refills | Status: DC

## 2017-11-05 NOTE — Patient Instructions (Addendum)
Stop by the lab and xray prior to leaving today. I will notify you of your results once received.   Resume omeprazole 40 mg once daily. Start taking ranitidine (Zantac) 150 mg once daily.   Continue to monitor your blood pressure at home. Please go to the hospital if your breathing gets worse, you develop chest pain, you start noticing fluid retention on the body.  It was a pleasure meeting you!

## 2017-11-05 NOTE — Telephone Encounter (Signed)
Noted  

## 2017-11-05 NOTE — Assessment & Plan Note (Signed)
Check A1C given increased urinary frequency x 1 month.

## 2017-11-05 NOTE — Progress Notes (Signed)
Subjective:    Patient ID: [name] Luna., male    DOB: [birth year], 77 y.o.   MRN: [id number]  HPI  [name] Young is a 77 year old male with a history of hypertension, GERD, Type 2 Diabetes, CKD stage III, tobacco abuse (quit in 2002) who presents today with a chief complaint of shortness of breath and abdominal pressure. He also reports high blood pressure.   He's noticed increased abdominal pressure and shortness of breath that began 4 days ago. He also reports belching with slight improvement in abdominal pressure. Over the past several days his symptoms have progressed. Today he had two bites of soup and experienced immediate shortness of breath.   He's also noticed urinary frequency and will urinate every 25-45 minutes throughout the day and evening. This has been going on for the past 1 month. He denies difficulty urinating, hematuria, dysuria. He feels less pressure when sitting in the recliner, cannot sleep in his bed.   He denies fevers, nausea, vomiting, chest pain, headaches, cough. He's been taking Rolaids and Beno without improvement in belching or symptoms. He's been eating full meals with shortness of breath before and after. Today was the first day he couldn't eat.   Review of Systems  Constitutional: Negative for fever.  HENT: Negative for congestion and sinus pressure.   Respiratory: Positive for shortness of breath. Negative for cough.   Cardiovascular: Negative for chest pain and palpitations.  Gastrointestinal:       Belching   Neurological: Negative for dizziness and headaches.       Past Medical History:  Diagnosis Date  . Chronic kidney disease    kidney stones  . GERD (gastroesophageal reflux disease)   . History of cholelithiasis   . History of kidney stones    ca ox [name] @ [facility]) now [place]  . History of pneumonia   . HLD (hyperlipidemia)   . HTN (hypertension)   . T2DM (type 2 diabetes mellitus) ([place]) 2010     Social History    Socioeconomic History  . Marital status: Married    Spouse name: Not on file  . Number of children: Not on file  . Years of education: Not on file  . Highest education level: Not on file  Social Needs  . Financial resource strain: Not on file  . Food insecurity - worry: Not on file  . Food insecurity - inability: Not on file  . Transportation needs - medical: Not on file  . Transportation needs - non-medical: Not on file  Occupational History  . Not on file  Tobacco Use  . Smoking status: Never Smoker  . Smokeless tobacco: Former Systems developer    Types: Chew  Substance and Sexual Activity  . Alcohol use: No  . Drug use: No  . Sexual activity: Not on file  Other Topics Concern  . 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   Diet: some water, fruits/vegetables daily, avoids potatoes    Past Surgical History:  Procedure Laterality Date  . CARPAL TUNNEL RELEASE Bilateral   . CATARACT EXTRACTION, BILATERAL    . COLONOSCOPY  11/2012   11 adenomatous polyps, diverticulosis, rec rpt 1 yr Ardis Hughs)  . COLONOSCOPY  12/2013   3 polyps, diverticulosis, rec rpt 3 yrs Ardis Hughs)  . KNEE CARTILAGE SURGERY Left   .  LITHOTRIPSY    . UMBILICAL HERNIA REPAIR     with mesh    Family History  Problem Relation Age of Onset  . Alzheimer's disease Mother   . Breast cancer Mother        breast  . Stroke Father 41  . Diabetes Father   . Rectal cancer Maternal Grandfather        rectal  . Colon cancer Maternal Grandfather 24  . CAD Neg Hx   . Esophageal cancer Neg Hx   . Stomach cancer Neg Hx     No Known Allergies  Current Outpatient Medications on File Prior to Visit  Medication Sig Dispense Refill  . aspirin 325 MG tablet Take 325 mg by mouth daily.    . Cholecalciferol (VITAMIN D) 2000 UNITS CAPS Take 1 capsule by mouth daily.    .  fenofibrate (TRICOR) 145 MG tablet TAKE 1 TABLET BY MOUTH DAILY 30 tablet 4  . Glucosamine-Chondroitin (OSTEO BI-FLEX REGULAR STRENGTH PO) Take 2 tablets by mouth daily.    Marland Kitchen lisinopril (PRINIVIL,ZESTRIL) 20 MG tablet TAKE 1 TABLET BY MOUTH DAILY 90 tablet 3  . metFORMIN (GLUCOPHAGE) 500 MG tablet Take 1 tablet (500 mg total) by mouth 2 (two) times daily with a meal. 180 tablet 3  . methocarbamol (ROBAXIN) 500 MG tablet Take 1 tablet (500 mg total) by mouth 2 (two) times daily as needed for muscle spasms. 30 tablet 0  . Multiple Vitamins-Minerals (MACULAR VITAMIN BENEFIT PO) Take 1 tablet by mouth daily.    Marland Kitchen nystatin (MYCOSTATIN/NYSTOP) powder Apply topically 3 (three) times daily. 30 g 0  . Omega-3 Fatty Acids (FISH OIL) 1200 MG CAPS Take 1 capsule by mouth 3 (three) times daily.    Marland Kitchen omeprazole (PRILOSEC) 40 MG capsule Take 1 capsule (40 mg total) by mouth daily. 30 capsule 11  . simvastatin (ZOCOR) 20 MG tablet TAKE ONE TABLET BY MOUTH EVERY NIGHT AT BEDTIME 90 tablet 3  . sitaGLIPtin (JANUVIA) 50 MG tablet Take 1 tablet (50 mg total) by mouth daily. 30 tablet 11   No current facility-administered medications on file prior to visit.     BP (!) 174/92   Pulse (!) 59   Temp 98.2 F (36.8 C) (Oral)   Ht 5' 9.5" (1.765 m)   Wt 286 lb 4 oz (129.8 kg)   SpO2 97%   BMI 41.67 kg/m    Objective:   Physical Exam  Constitutional: He is oriented to person, place, and time. He appears well-nourished.  Neck: Neck supple.  Cardiovascular: Normal rate and regular rhythm.  PVC's  Pulmonary/Chest: Effort normal and breath sounds normal. He has no wheezes.  Abdominal: Soft. Bowel sounds are normal. There is no tenderness.  Neurological: He is alert and oriented to person, place, and time.  Skin: Skin is warm and dry.          Assessment & Plan:  Case discussed with PCP.

## 2017-11-05 NOTE — Assessment & Plan Note (Signed)
Increased urinary frequency x 1 month, could be BPH. UA today negative. Also repeat PSA. Consider Tamsulosin.

## 2017-11-05 NOTE — Assessment & Plan Note (Signed)
Present for the past 4 days, worse.  Check chest xray. Check labs including Troponin, BNP, CMP. ECG with NSR, rate of 88 with bigeminy PVC's. No ST elevation or t-wave inversion.

## 2017-11-05 NOTE — Assessment & Plan Note (Signed)
Resume daily dosing of omeprazole 40 mg, has been taking every other day. Add Zantac 150 mg HS x 30 days.

## 2017-11-05 NOTE — Assessment & Plan Note (Addendum)
Above goal today, will have him continue current regimen and monitor BP at home. He will reports readings at or above 140/90. Consider adding Amlodipine 10 mg, await further testing that is pending.

## 2017-11-05 NOTE — Telephone Encounter (Signed)
    Answer Assessment - Initial Assessment Questions Phoned with bloating feeling in his stomach that feels like it's pushing up in to chest area for 3 days. High blood pressure readings this morning. Taking tums multiple times. Breathes easier outside in the cold but short of breath inside.  1. BLOOD PRESSURE: "What is the blood pressure?" "Did you take at least two measurements 5 minutes apart?"     196/95 twice this am. 2. ONSET: "When did you take your blood pressure?"    today 3. HOW: "How did you obtain the blood pressure?" (e.g., visiting nurse, automatic home BP monitor)     home 4. HISTORY: "Do you have a history of high blood pressure?"    yes 5. MEDICATIONS: "Are you taking any medications for blood pressure?" "Have you missed any doses recently?"    Yes taking as prescribed. 6. OTHER SYMPTOMS: "Do you have any symptoms?" (e.g., headache, chest pain, blurred vision, difficulty breathing, weakness)     Difficulty breathing, stomach feels like it's pushing up in chest. Taking tums multiple times over 3 days with no relief.  7. PREGNANCY: "Is there any chance you are pregnant?" "When was your last menstrual period?"    na  Protocols used: HIGH BLOOD PRESSURE-A-AH

## 2017-11-05 NOTE — Assessment & Plan Note (Signed)
Check CMP for renal and liver function given increased shortness of breath.

## 2017-11-15 ENCOUNTER — Telehealth: Payer: Self-pay

## 2017-11-15 ENCOUNTER — Ambulatory Visit: Payer: Medicare Other | Admitting: Family Medicine

## 2017-11-15 ENCOUNTER — Encounter: Payer: Self-pay | Admitting: Family Medicine

## 2017-11-15 VITALS — BP 144/64 | HR 63 | Temp 97.8°F | Wt 284.0 lb

## 2017-11-15 DIAGNOSIS — N400 Enlarged prostate without lower urinary tract symptoms: Secondary | ICD-10-CM

## 2017-11-15 DIAGNOSIS — R0600 Dyspnea, unspecified: Secondary | ICD-10-CM | POA: Diagnosis not present

## 2017-11-15 DIAGNOSIS — E1140 Type 2 diabetes mellitus with diabetic neuropathy, unspecified: Secondary | ICD-10-CM

## 2017-11-15 DIAGNOSIS — I10 Essential (primary) hypertension: Secondary | ICD-10-CM | POA: Diagnosis not present

## 2017-11-15 MED ORDER — DOXAZOSIN MESYLATE 2 MG PO TABS: 2.0000 mg | ORAL_TABLET | Freq: Every day | ORAL | 3 refills | Status: DC

## 2017-11-15 MED ORDER — FUROSEMIDE 20 MG PO TABS: 10.0000 mg | ORAL_TABLET | Freq: Every day | ORAL | 3 refills | Status: DC | PRN

## 2017-11-15 MED ORDER — OMEPRAZOLE 40 MG PO CPDR: 40.0000 mg | DELAYED_RELEASE_CAPSULE | Freq: Every day | ORAL | 11 refills | Status: DC

## 2017-11-15 MED ORDER — DOXAZOSIN MESYLATE 1 MG PO TABS: 1.0000 mg | ORAL_TABLET | Freq: Every day | ORAL | 11 refills | Status: DC

## 2017-11-15 NOTE — Assessment & Plan Note (Signed)
Hold januvia pending echo and cards eval. Continue metformin.

## 2017-11-15 NOTE — Assessment & Plan Note (Signed)
Dyspnea predominantly with exertion and orthopnea. Possible R-sided CHF despite stable BNP and no pedal edema. Discussed possible OSA contribution - pt declines sleep study at this time. Will start cardiac evaluation with echocardiogram and cards referral to eval for possible CAD contribution. Continue daily PPI and zantac for possible GI contribution.

## 2017-11-15 NOTE — Assessment & Plan Note (Signed)
BP improvement noted with addition of cardura. Will increase to 2mg  daily.

## 2017-11-15 NOTE — Telephone Encounter (Signed)
Lovena Le from [facility] called to see if pt was supposed to be on Cardura 1 mg or 2 mg. Per office note on 11/15/17 pt was increased to Cardura 2 mg daily. Lovena Le voiced understanding.

## 2017-11-15 NOTE — Progress Notes (Signed)
BP (!) 144/64 (BP Location: Right Arm, Cuff Size: Large)   Pulse 63   Temp 97.8 F (36.6 C) (Oral)   Wt 284 lb (128.8 kg)   SpO2 98%   BMI 41.34 kg/m   On recheck, 152/72  CC: dyspnea Subjective:    Patient ID: [name]., male    DOB: 1941/06/30, 77 y.o.   MRN: [id number]  HPI: Basilio Meadow. is a 77 y.o. male presenting on 11/15/2017 for Follow-up (Pt provided copy of home BP readings. States he still gets so gassy at times, after eating.  Also, urine stream has improved. Sometimes still takes awhile to finish but with less "dribble" at the end.)   See prior note for details - seen by [name] 2 wks ago with acute onset dyspnea, gassiness, and abdominal pressure. He also had urinary frequency and incomplete emptying. Workup reviewed including labs, CXR, EKG (poor R wave progression). Worsening exertional dyspnea and orthopnea. Describes increased abdominal pressure, gassiness, bloating. Needs to sleep in recliner. Does worse with   Started on cardura, omeprazole was changed to daily, zantac nightly was added. This has helped GI sxs some. Cardura has helped voiding but persistent nocturia. Known BPH.   Brings BP log from home - 150-160s/60-70s.  Never smoker. No known lung disease.  He does snore. No witnessed apenic episodes. Does awaken feeling refreshed. Has not been evalulated   Relevant past medical, surgical, family and social history reviewed and updated as indicated. Interim medical history since our last visit reviewed. Allergies and medications reviewed and updated. Outpatient Medications Prior to Visit  Medication Sig Dispense Refill  . aspirin 325 MG tablet Take 325 mg by mouth daily.    . Cholecalciferol (VITAMIN D) 2000 UNITS CAPS Take 1 capsule by mouth daily.    . fenofibrate (TRICOR) 145 MG tablet TAKE 1 TABLET BY MOUTH DAILY 30 tablet 4  . Glucosamine-Chondroitin (OSTEO BI-FLEX REGULAR STRENGTH PO) Take 2 tablets by mouth daily.    Marland Kitchen lisinopril  (PRINIVIL,ZESTRIL) 20 MG tablet TAKE 1 TABLET BY MOUTH DAILY 90 tablet 3  . metFORMIN (GLUCOPHAGE) 500 MG tablet Take 1 tablet (500 mg total) by mouth 2 (two) times daily with a meal. 180 tablet 3  . Multiple Vitamins-Minerals (MACULAR VITAMIN BENEFIT PO) Take 1 tablet by mouth daily.    . Omega-3 Fatty Acids (FISH OIL) 1200 MG CAPS Take 1 capsule by mouth 3 (three) times daily.    . ranitidine (ZANTAC) 150 MG tablet Take 1 tablet (150 mg total) by mouth at bedtime. 30 tablet 0  . simvastatin (ZOCOR) 20 MG tablet TAKE ONE TABLET BY MOUTH EVERY NIGHT AT BEDTIME 90 tablet 3  . sitaGLIPtin (JANUVIA) 50 MG tablet Take 1 tablet (50 mg total) by mouth daily. 30 tablet 11  . doxazosin (CARDURA) 1 MG tablet Take 1 tablet (1 mg total) by mouth daily. 30 tablet 0  . omeprazole (PRILOSEC) 40 MG capsule Take 1 capsule (40 mg total) by mouth daily. 30 capsule 11  . methocarbamol (ROBAXIN) 500 MG tablet Take 1 tablet (500 mg total) by mouth 2 (two) times daily as needed for muscle spasms. 30 tablet 0  . nystatin (MYCOSTATIN/NYSTOP) powder Apply topically 3 (three) times daily. 30 g 0   No facility-administered medications prior to visit.      Per HPI unless specifically indicated in ROS section below Review of Systems     Objective:    BP (!) 144/64 (BP Location: Right Arm, Cuff Size:  Large)   Pulse 63   Temp 97.8 F (36.6 C) (Oral)   Wt 284 lb (128.8 kg)   SpO2 98%   BMI 41.34 kg/m   Wt Readings from Last 3 Encounters:  11/15/17 284 lb (128.8 kg)  11/05/17 286 lb 4 oz (129.8 kg)  06/14/17 282 lb 4 oz (128 kg)    Physical Exam  Constitutional: He appears well-developed and well-nourished. No distress.  HENT:  Mouth/Throat: Oropharynx is clear and moist. No oropharyngeal exudate.  Eyes: Pupils are equal, round, and reactive to light.  Neck: Normal range of motion. Neck supple. JVD present. No hepatojugular reflux present.  Cardiovascular: Normal rate, regular rhythm, normal heart sounds and  intact distal pulses. Frequent extrasystoles are present.  No murmur heard. Pulmonary/Chest: Effort normal and breath sounds normal. No respiratory distress. He has no wheezes. He has no rales.  Musculoskeletal: He exhibits no edema.  Lymphadenopathy:    He has no cervical adenopathy.  Skin: Skin is warm and dry.  Psychiatric: He has a normal mood and affect.  Nursing note and vitals reviewed.  Results for orders placed or performed in visit on 11/05/17  Hemoglobin A1c  Result Value Ref Range   Hgb A1c MFr Bld 6.8 (H) 4.6 - 6.5 %  Comprehensive metabolic panel  Result Value Ref Range   Sodium 141 135 - 145 mEq/L   Potassium 4.4 3.5 - 5.1 mEq/L   Chloride 103 96 - 112 mEq/L   CO2 30 19 - 32 mEq/L   Glucose, Bld 106 (H) 70 - 99 mg/dL   BUN 22 6 - 23 mg/dL   Creatinine, Ser 1.42 0.40 - 1.50 mg/dL   Total Bilirubin 0.6 0.2 - 1.2 mg/dL   Alkaline Phosphatase 29 (L) 39 - 117 U/L   AST 20 0 - 37 U/L   ALT 18 0 - 53 U/L   Total Protein 6.9 6.0 - 8.3 g/dL   Albumin 4.1 3.5 - 5.2 g/dL   Calcium 10.2 8.4 - 10.5 mg/dL   GFR 51.61 (L) >60.00 mL/min  Brain natriuretic peptide  Result Value Ref Range   Pro B Natriuretic peptide (BNP) 158.0 (H) 0.0 - 100.0 pg/mL  Troponin I  Result Value Ref Range   TNIDX 0.00 0.00 - 0.06 ug/l  PSA  Result Value Ref Range   PSA 0.83 0.10 - 4.00 ng/mL  POCT Urinalysis Dipstick (Automated)  Result Value Ref Range   Color, UA yellow    Clarity, UA clear    Glucose, UA negative    Bilirubin, UA negative    Ketones, UA negative    Spec Grav, UA 1.025 1.010 - 1.025   Blood, UA negative    pH, UA 6.0 5.0 - 8.0   Protein, UA negative    Urobilinogen, UA 1.0 0.2 or 1.0 E.U./dL   Nitrite, UA negative    Leukocytes, UA Negative Negative   EKG - NSR rate 90s, ventricular trigeminy with frequent PVCs, normal axis, intervals, T wave flattening inferioseptally with poor R wave progression   Assessment & Plan:   Problem List Items Addressed This Visit     Benign prostatic hyperplasia    Improved urinary symptoms with addition of cardura - will continue titration.       Relevant Medications   doxazosin (CARDURA) 2 MG tablet   Dyspnea - Primary    Dyspnea predominantly with exertion and orthopnea. Possible R-sided CHF despite stable BNP and no pedal edema. Discussed possible OSA contribution - pt declines  sleep study at this time. Will start cardiac evaluation with echocardiogram and cards referral to eval for possible CAD contribution. Continue daily PPI and zantac for possible GI contribution.       Relevant Orders   Ambulatory referral to Cardiology   ECHOCARDIOGRAM COMPLETE   EKG 12-Lead (Completed)   HTN (hypertension)    BP improvement noted with addition of cardura. Will increase to 2mg  daily.       Relevant Medications   doxazosin (CARDURA) 2 MG tablet   Other Relevant Orders   EKG 12-Lead (Completed)   Type 2 diabetes, controlled, with neuropathy ([place])    Hold januvia pending echo and cards eval. Continue metformin.           Meds ordered this encounter  Medications  . omeprazole (PRILOSEC) 40 MG capsule    Sig: Take 1 capsule (40 mg total) by mouth daily.    Dispense:  30 capsule    Refill:  11  . DISCONTD: doxazosin (CARDURA) 1 MG tablet    Sig: Take 1 tablet (1 mg total) by mouth daily.    Dispense:  30 tablet    Refill:  11  . DISCONTD: furosemide (LASIX) 20 MG tablet    Sig: Take 0.5-1 tablets (10-20 mg total) by mouth daily as needed for fluid or edema.    Dispense:  30 tablet    Refill:  3  . doxazosin (CARDURA) 2 MG tablet    Sig: Take 1 tablet (2 mg total) by mouth daily.    Dispense:  30 tablet    Refill:  3    Use this sig   Orders Placed This Encounter  Procedures  . Ambulatory referral to Cardiology    Referral Priority:   Routine    Referral Type:   Consultation    Referral Reason:   Specialty Services Required    Requested Specialty:   Cardiology    Number of Visits Requested:   1  . EKG  12-Lead  . ECHOCARDIOGRAM COMPLETE    Standing Status:   Future    Standing Expiration Date:   02/16/2019    Order Specific Question:   Where should this test be performed    Answer:   [facility]    Order Specific Question:   Perflutren DEFINITY (image enhancing agent) should be administered unless hypersensitivity or allergy exist    Answer:   Administer Perflutren    Order Specific Question:   Expected Date:    Answer:   1 week    Follow up plan: Return if symptoms worsen or fail to improve.  Ria Bush, MD

## 2017-11-15 NOTE — Assessment & Plan Note (Signed)
Improved urinary symptoms with addition of cardura - will continue titration.

## 2017-11-15 NOTE — Patient Instructions (Addendum)
Repeat EKG today. Increase cardura to 2mg  daily - may take 2 pills until you run out then new dose 2mg  will be at pharmacy.  We will refer you for heart ultrasound.  Let's hold januvia for now - until heart is checked out.  We will refer you to heart doctor as well.

## 2017-11-23 ENCOUNTER — Ambulatory Visit (HOSPITAL_COMMUNITY): Payer: Medicare Other | Attending: Cardiovascular Disease

## 2017-11-23 ENCOUNTER — Other Ambulatory Visit: Payer: Self-pay

## 2017-11-23 DIAGNOSIS — E785 Hyperlipidemia, unspecified: Secondary | ICD-10-CM | POA: Diagnosis not present

## 2017-11-23 DIAGNOSIS — E1122 Type 2 diabetes mellitus with diabetic chronic kidney disease: Secondary | ICD-10-CM | POA: Insufficient documentation

## 2017-11-23 DIAGNOSIS — N183 Chronic kidney disease, stage 3 (moderate): Secondary | ICD-10-CM | POA: Diagnosis not present

## 2017-11-23 DIAGNOSIS — R0600 Dyspnea, unspecified: Secondary | ICD-10-CM | POA: Diagnosis not present

## 2017-11-23 DIAGNOSIS — I080 Rheumatic disorders of both mitral and aortic valves: Secondary | ICD-10-CM | POA: Insufficient documentation

## 2017-11-23 DIAGNOSIS — I1310 Hypertensive heart and chronic kidney disease without heart failure, with stage 1 through stage 4 chronic kidney disease, or unspecified chronic kidney disease: Secondary | ICD-10-CM | POA: Diagnosis not present

## 2017-11-23 DIAGNOSIS — Z6841 Body Mass Index (BMI) 40.0 and over, adult: Secondary | ICD-10-CM | POA: Diagnosis not present

## 2017-11-24 ENCOUNTER — Encounter: Payer: Self-pay | Admitting: Family Medicine

## 2017-11-24 DIAGNOSIS — I099 Rheumatic heart disease, unspecified: Secondary | ICD-10-CM | POA: Insufficient documentation

## 2017-12-03 NOTE — Progress Notes (Signed)
Cardiology Office Note   Date:  12/05/2017   ID:  [name]., DOB 1941/04/09, MRN [id number]  PCP:  Ria Bush, MD  Cardiologist: NEW Dr. Jacqulyn Bath  Today at [facility]  Chief Complaint  Patient presents with  . Chest Pain  . Shortness of Breath     History of Present Illness: [name] Young. is a 77 y.o. male who presents for cardiology evaluation and to be established with our practice in the setting of recurrent dyspnea at the request of Dr. Danise Mina.  The patient has had symptomatic dyspnea on exertion, orthopnea, along with bloating feeling, and an abdominal pressure with gas.  The patient needs to sleep in a recliner.  History includes hypertension, type 2 diabetes, hypercholesterolemia, arthritis, and GERD.  As result of ongoing symptoms, he is requested a cardiac evaluation for possible CAD evaluation for ischemia, along with further recommendations for  testing if necessary. He is retired from [employer], and also from [employer].   Echocardiogram 11/23/2017 Left ventricle: The cavity size was normal. There was mild   concentric hypertrophy. Systolic function was normal. The   estimated ejection fraction was in the range of 50% to 55%. Wall   motion was normal; there were no regional wall motion   abnormalities. Doppler parameters are consistent with abnormal   left ventricular relaxation (grade 1 diastolic dysfunction). - Aortic valve: Transvalvular velocity was within the normal range.   There was no stenosis. There was no regurgitation. - Mitral valve: Moderately thickened, moderately calcified anterior   leaflet. , consistent with rheumatic disease. Transvalvular   velocity was within the normal range. There was no evidence for   stenosis. There was trivial regurgitation. - Right ventricle: The cavity size was normal. Wall thickness was   normal. Systolic function was normal. - Atrial septum: No defect or patent foramen ovale was identified   by color  flow Doppler. - Tricuspid valve: There was trivial regurgitation. - Pulmonary arteries: Systolic pressure was within the normal   range. PA peak pressure: 20 mm Hg (S).  The patient states that he is having "spells"  When he begins burping, having substernal heart burn, pressure in his chest and dyspnea. This usually occurs when he is lying down. He is having episodes of PND, and orthopnea. He has been sleeping in a recliner for the last 5 days or more. He denies significant weight gain or edema. He does have some abdominal fullness. Also complains of DOE, and more fatigue over the last year. He is morbidly obese, snores and stops breathing at night according to his wife. He is NOT interested in sleep study or CPAP, as he is claustrophobic. He does not limit salt or adhere to carbohydrate restricted diet. Had ham biscuit for breakfast which is a "light breakfast" for him.   Past Medical History:  Diagnosis Date  . Chronic kidney disease    kidney stones  . GERD (gastroesophageal reflux disease)   . History of cholelithiasis   . History of kidney stones    ca ox [name] @ [facility]) now [facility]  . History of pneumonia   . HLD (hyperlipidemia)   . HTN (hypertension)   . T2DM (type 2 diabetes mellitus) ([place]) 2010    Past Surgical History:  Procedure Laterality Date  . CARPAL TUNNEL RELEASE Bilateral   . CATARACT EXTRACTION, BILATERAL    . COLONOSCOPY  11/2012   12 adenomatous polyps, diverticulosis, rec rpt 1 yr Ardis Hughs)  . COLONOSCOPY  12/2013  3 polyps, diverticulosis, rec rpt 3 yrs Ardis Hughs)  . KNEE CARTILAGE SURGERY Left   . LITHOTRIPSY    . UMBILICAL HERNIA REPAIR     with mesh     Current Outpatient Medications  Medication Sig Dispense Refill  . aspirin 325 MG tablet Take 325 mg by mouth daily.    . Cholecalciferol (VITAMIN D) 2000 UNITS CAPS Take 1 capsule by mouth daily.    Marland Kitchen doxazosin (CARDURA) 2 MG tablet Take 1 tablet (2 mg total) by mouth daily. 30 tablet 3  .  fenofibrate (TRICOR) 145 MG tablet TAKE 1 TABLET BY MOUTH DAILY 30 tablet 4  . Glucosamine-Chondroitin (OSTEO BI-FLEX REGULAR STRENGTH PO) Take 2 tablets by mouth daily.    Marland Kitchen lisinopril (PRINIVIL,ZESTRIL) 20 MG tablet TAKE 1 TABLET BY MOUTH DAILY 90 tablet 3  . metFORMIN (GLUCOPHAGE) 500 MG tablet Take 1 tablet (500 mg total) by mouth 2 (two) times daily with a meal. 180 tablet 3  . Multiple Vitamins-Minerals (MACULAR VITAMIN BENEFIT PO) Take 1 tablet by mouth daily.    . Omega-3 Fatty Acids (FISH OIL) 1200 MG CAPS Take 1 capsule by mouth 3 (three) times daily.    Marland Kitchen omeprazole (PRILOSEC) 40 MG capsule Take 1 capsule (40 mg total) by mouth daily. 30 capsule 11  . simvastatin (ZOCOR) 20 MG tablet TAKE ONE TABLET BY MOUTH EVERY NIGHT AT BEDTIME 90 tablet 3  . sitaGLIPtin (JANUVIA) 50 MG tablet Take 1 tablet (50 mg total) by mouth daily. (Patient not taking: Reported on 12/05/2017) 30 tablet 11   No current facility-administered medications for this visit.     Allergies:   Patient has no known allergies.    Social History:  The patient  reports that he has never smoked. He quit smokeless tobacco use about 16 years ago. His smokeless tobacco use included chew. He reports that he does not drink alcohol or use drugs.   Family History:  The patient's family history includes Alzheimer's disease in his mother; Atrial fibrillation in his mother; Breast cancer in his mother; CAD in his father and mother; Colon cancer (age of onset: 63) in his maternal grandfather; Diabetes in his father; Rectal cancer in his maternal grandfather; Stroke (age of onset: 11) in his father.    ROS: All other systems are reviewed and negative. Unless otherwise mentioned in H&P    PHYSICAL EXAM: VS:  BP 140/70 (BP Location: Left Arm, Patient Position: Sitting, Cuff Size: Normal)   Pulse 80   Ht 5' 11"  (1.803 m)   Wt 284 lb 6.4 oz (129 kg)   SpO2 98%   BMI 39.67 kg/m  , BMI Body mass index is 39.67 kg/m. GEN: Well  nourished, well developed, in no acute distress  HEENT: normal  Neck: no JVD, carotid bruits, or masses Cardiac: RRR; frequent extra systole., no murmurs, rubs, or gallops, 1+ pretibial edema  Respiratory:  Cear to auscultation bilaterally, normal work of breathing GI: soft, nontender, nondistended, + BS. Obese, no ballotment  MS: no deformity or atrophy  Skin: warm and dry, no rash Neuro:  Strength and sensation are intact Psych: euthymic mood, full affect   EKG:  SR with frequent PVC's heart rate of 80 bpm. Non-specific ST changes.   Recent Labs: 11/05/2017: ALT 18; BUN 22; Creatinine, Ser 1.42; Potassium 4.4; Pro B Natriuretic peptide (BNP) 158.0; Sodium 141    Lipid Panel    Component Value Date/Time   CHOL 138 06/11/2017 0752   CHOL 178 02/01/2010  TRIG 308.0 (H) 06/11/2017 0752   TRIG 812 02/01/2010   HDL 24.40 (L) 06/11/2017 0752   CHOLHDL 6 06/11/2017 0752   VLDL 61.6 (H) 06/11/2017 0752   LDLCALC 63 10/04/2012 0808   LDLDIRECT 67.0 06/11/2017 0752      Wt Readings from Last 3 Encounters:  12/05/17 284 lb 6.4 oz (129 kg)  11/15/17 284 lb (128.8 kg)  11/05/17 286 lb 4 oz (129.8 kg)      Other studies Reviewed: CXR: 11/05/2017 FINDINGS: The heart size and mediastinal contours are within normal limits. Both lungs are clear. No pneumothorax or pleural effusion is noted. The visualized skeletal structures are unremarkable.  IMPRESSION: No active cardiopulmonary disease.  ASSESSMENT AND PLAN:  1.  Chest discomfort: Multiple cardiovascular risk factors to include type 2 diabetes, obesity, age, male, family history, hypertension, and hypercholesterolemia.  The patient is adamantly refusing any type of invasive procedure right now, he is very claustrophobic and would not wish to undergo cardiac CTA.  He is willing to proceed with nuclear medicine GXT for diagnostic prognostic purposes.  I have discussed this with Dr. Percival Spanish, who is DOD today, there is reviewed  his chart met with the patient discussed his symptoms with him and agrees with my assessment and plan.  2.  PND and orthopnea: Possible chronic diastolic CHF.  Patient's echocardiogram as above with grade 1 diastolic dysfunction but normal LVEF.  The patient will be placed on Lasix 20 mg daily to assist with fluid retention and breathing status.  3.  Hypertension: Blood pressure is only moderately controlled.  We will not make any medication changes at this time until stress Myoview results are available.  Would like disease blood pressure approximately 20 points lower.  4.  Hypercholesterolemia with mixed hyperlipidemia: On review of most recent labs it is not well controlled.  Dietary restrictions are recommended he is not adhering to a low carb, low-cholesterol diet.  5.  Morbid obesity: Weight loss and increased exercise are recommended as well as adherence to his diabetic diet.  6.  OSA: Although he is not been officially diagnosed, he is very symptomatic and apnea his wife.  He adamantly refuses any further studies as he states that he will not wear a CPAP. Current medicines are reviewed at length with the patient today.    Labs/ tests ordered today include: NM Stress Myoview  Phill Myron. West Pugh, ANP, AACC   12/05/2017 10:46 AM    [facility] [address] [place], [place] [address] Phone: [phone]; Fax: [phone]

## 2017-12-05 ENCOUNTER — Ambulatory Visit: Payer: Medicare Other | Admitting: Adult Health

## 2017-12-05 ENCOUNTER — Encounter: Payer: Self-pay | Admitting: Cardiology

## 2017-12-05 ENCOUNTER — Encounter: Payer: Self-pay | Admitting: Adult Health

## 2017-12-05 VITALS — BP 140/70 | HR 80 | Ht 71.0 in | Wt 284.4 lb

## 2017-12-05 DIAGNOSIS — R0600 Dyspnea, unspecified: Secondary | ICD-10-CM

## 2017-12-05 DIAGNOSIS — R079 Chest pain, unspecified: Secondary | ICD-10-CM

## 2017-12-05 DIAGNOSIS — Z79899 Other long term (current) drug therapy: Secondary | ICD-10-CM | POA: Diagnosis not present

## 2017-12-05 DIAGNOSIS — E7800 Pure hypercholesterolemia, unspecified: Secondary | ICD-10-CM

## 2017-12-05 DIAGNOSIS — R5383 Other fatigue: Secondary | ICD-10-CM

## 2017-12-05 DIAGNOSIS — R0602 Shortness of breath: Secondary | ICD-10-CM | POA: Diagnosis not present

## 2017-12-05 MED ORDER — FUROSEMIDE 20 MG PO TABS: 20.0000 mg | ORAL_TABLET | Freq: Every day | ORAL | 3 refills | Status: DC

## 2017-12-05 NOTE — Patient Instructions (Signed)
Medication Instructions:  START LASIX 20MG  DAILY  If you need a refill on your cardiac medications before your next appointment, please call your pharmacy.  Labwork: BMET IN ONE WEEK HERE IN OUR OFFICE AT [facility]  Take the provided lab slips for you to take with you to the lab for you blood draw.    You will NOT need to fast   Testing/Procedures: Your physician has requested that you have an Exercise Myoview. A cardiac stress test is a cardiological test that measures the heart's ability to respond to external stress (exercise-treadmill) in a controlled clinical environment. The stress response is induced by exercise or by intravenous pharmacological stimulation. For further information please visit [employer]uge[employer]iesta[employer]tn. Please follow instructions below.  How to prepare for your Myocardial Perfusion Test:  Do not eat or drink 3 hours prior to your test, except you may have water.  Do not consume products containing caffeine (regular or decaffeinated) 12 hours prior to your test. (ex: coffee, chocolate, sodas, tea).  Do wear comfortable clothes (no dresses or overalls) and walking shoes, tennis shoes preferred (No heels or open toe shoes are allowed).  Do NOT wear cologne, perfume, aftershave, or lotions (deodorant is allowed).  If these instructions are not followed, your test will have to be rescheduled.  If you have questions or concerns about your appointment, you can call the Nuclear Lab at [phone].  Follow-Up: Your physician wants you to follow-up in: AFTER TESTING WITH [facility].   Thank you for choosing [facility] at [facility]!!

## 2017-12-06 ENCOUNTER — Telehealth (HOSPITAL_COMMUNITY): Payer: Self-pay

## 2017-12-06 NOTE — Telephone Encounter (Signed)
Encounter complete. 

## 2017-12-10 ENCOUNTER — Ambulatory Visit (INDEPENDENT_AMBULATORY_CARE_PROVIDER_SITE_OTHER): Payer: Medicare Other

## 2017-12-10 VITALS — BP 150/78 | HR 76 | Temp 97.9°F | Ht 70.5 in | Wt 280.2 lb

## 2017-12-10 DIAGNOSIS — E785 Hyperlipidemia, unspecified: Secondary | ICD-10-CM | POA: Diagnosis not present

## 2017-12-10 DIAGNOSIS — Z0000 Encounter for general adult medical examination without abnormal findings: Secondary | ICD-10-CM | POA: Diagnosis not present

## 2017-12-10 DIAGNOSIS — I099 Rheumatic heart disease, unspecified: Secondary | ICD-10-CM

## 2017-12-10 LAB — LIPID PANEL
Cholesterol: 117 mg/dL (ref 0–200)
HDL: 26.4 mg/dL — ABNORMAL LOW (ref >39.00)
LDL CALC: 59 mg/dL (ref 0–99)
NonHDL: 90.62
TRIGLYCERIDES: 159 mg/dL — AB (ref 0.0–149.0)
Total CHOL/HDL Ratio: 4
VLDL: 31.8 mg/dL (ref 0.0–40.0)

## 2017-12-10 LAB — BASIC METABOLIC PANEL
BUN: 28 mg/dL — AB (ref 6–23)
CHLORIDE: 106 meq/L (ref 96–112)
CO2: 29 mEq/L (ref 19–32)
Calcium: 9.3 mg/dL (ref 8.4–10.5)
Creatinine, Ser: 1.42 mg/dL (ref 0.40–1.50)
GFR: 51.59 mL/min — AB (ref >60.00)
Glucose, Bld: 169 mg/dL — ABNORMAL HIGH (ref 70–99)
POTASSIUM: 4.3 meq/L (ref 3.5–5.1)
Sodium: 143 mEq/L (ref 135–145)

## 2017-12-10 NOTE — Patient Instructions (Signed)
[name] Young , Thank you for taking time to come for your Medicare Wellness Visit. I appreciate your ongoing commitment to your health goals. Please review the following plan we discussed and let me know if I can assist you in the future.   These are the goals we discussed: Goals    . Follow up with Primary Care Provider     Starting 12/10/2017, I will continue to take medications as prescribed and to keep appointments with PCP as scheduled.        This is a list of the screening recommended for you and due dates:  Health Maintenance  Topic Date Due  . Stool Blood Test  09/10/2018*  . Colon Cancer Screening  09/10/2018*  . DTaP/Tdap/Td vaccine (1 - Tdap) 10/10/2021*  . Eye exam for diabetics  03/11/2018  . Flu Shot  04/11/2018  . Hemoglobin A1C  05/05/2018  . Complete foot exam   06/14/2018  . Tetanus Vaccine  10/10/2021  . Pneumonia vaccines  Completed  *Topic was postponed. The date shown is not the original due date.   Preventive Care for Adults  A healthy lifestyle and preventive care can promote health and wellness. Preventive health guidelines for adults include the following key practices.  . A routine yearly physical is a good way to check with your health care provider about your health and preventive screening. It is a chance to share any concerns and updates on your health and to receive a thorough exam.  . Visit your dentist for a routine exam and preventive care every 6 months. Brush your teeth twice a day and floss once a day. Good oral hygiene prevents tooth decay and gum disease.  . The frequency of eye exams is based on your age, health, family medical history, use  of contact lenses, and other factors. Follow your health care provider's recommendations for frequency of eye exams.  . Eat a healthy diet. Foods like vegetables, fruits, whole grains, low-fat dairy products, and lean protein foods contain the nutrients you need without too many calories. Decrease your  intake of foods high in solid fats, added sugars, and salt. Eat the right amount of calories for you. Get information about a proper diet from your health care provider, if necessary.  . Regular physical exercise is one of the most important things you can do for your health. Most adults should get at least 150 minutes of moderate-intensity exercise (any activity that increases your heart rate and causes you to sweat) each week. In addition, most adults need muscle-strengthening exercises on 2 or more days a week.  Silver Sneakers may be a benefit available to you. To determine eligibility, you may visit the website: www.silversneakers.com or contact program at (985)730-5296 Mon-Fri between 8AM-8PM.   . Maintain a healthy weight. The body mass index (BMI) is a screening tool to identify possible weight problems. It provides an estimate of body fat based on height and weight. Your health care provider can find your BMI and can help you achieve or maintain a healthy weight.   For adults 20 years and older: ? A BMI below 18.5 is considered underweight. ? A BMI of 18.5 to 24.9 is normal. ? A BMI of 25 to 29.9 is considered overweight. ? A BMI of 30 and above is considered obese.   . Maintain normal blood lipids and cholesterol levels by exercising and minimizing your intake of saturated fat. Eat a balanced diet with plenty of fruit and vegetables. Blood tests  for lipids and cholesterol should begin at age 85 and be repeated every 5 years. If your lipid or cholesterol levels are high, you are over 50, or you are at high risk for heart disease, you may need your cholesterol levels checked more frequently. Ongoing high lipid and cholesterol levels should be treated with medicines if diet and exercise are not working.  . If you smoke, find out from your health care provider how to quit. If you do not use tobacco, please do not start.  . If you choose to drink alcohol, please do not consume more than 2  drinks per day. One drink is considered to be 12 ounces (355 mL) of beer, 5 ounces (148 mL) of wine, or 1.5 ounces (44 mL) of liquor.  . If you are 33-59 years old, ask your health care provider if you should take aspirin to prevent strokes.  . Use sunscreen. Apply sunscreen liberally and repeatedly throughout the day. You should seek shade when your shadow is shorter than you. Protect yourself by wearing long sleeves, pants, a wide-brimmed hat, and sunglasses year round, whenever you are outdoors.  . Once a month, do a whole body skin exam, using a mirror to look at the skin on your back. Tell your health care provider of new moles, moles that have irregular borders, moles that are larger than a pencil eraser, or moles that have changed in shape or color.

## 2017-12-10 NOTE — Progress Notes (Signed)
PCP notes:   Health maintenance:  Colon cancer screening - PCP please address at next appt  Abnormal screenings:   Hearing - failed  Hearing Screening   125Hz  250Hz  500Hz  1000Hz  2000Hz  3000Hz  4000Hz  6000Hz  8000Hz   Right ear:   40 0 40  0    Left ear:   40 40 40  0     Patient concerns:   Pt states he was able to sleep in his bed last night for the first time in a long period. Still seeing cardiologist and has pending diagnostic tests.  Nurse concerns:  None  Next PCP appt:   12/18/2017 @ 1030

## 2017-12-10 NOTE — Progress Notes (Signed)
Subjective:   [name] Young. is a 77 y.o. male who presents for Medicare Annual/Subsequent preventive examination.  Review of Systems:  N/A Cardiac Risk Factors include: advanced age (>28men, >11 women);male gender;diabetes mellitus;obesity (BMI >30kg/m2);dyslipidemia;hypertension     Objective:    Vitals: BP (!) 150/78 (BP Location: Right Arm, Patient Position: Sitting, Cuff Size: Large)   Pulse 76   Temp 97.9 F (36.6 C) (Oral)   Ht 5' 10.5" (1.791 m) Comment: shoes  Wt 280 lb 4 oz (127.1 kg)   SpO2 96%   BMI 39.64 kg/m   Body mass index is 39.64 kg/m.  Advanced Directives 12/10/2017 11/06/2016 12/22/2013 09/25/2013 09/23/2013  Does Patient Have a Medical Advance Directive? Yes No Patient does not have advance directive;Patient would not like information Patient does not have advance directive;Patient would not like information Patient does not have advance directive;Patient would not like information  Type of Scientist, forensic Power of [place];Living will - - - -  Copy of [place] in Chart? No - copy requested - - - -    Tobacco Social History   Tobacco Use  Smoking Status Never Smoker  Smokeless Tobacco Former Systems developer  . Types: Chew     Counseling given: No   Clinical Intake:  Pre-visit preparation completed: Yes  Pain : No/denies pain Pain Score: 0-No pain     Nutritional Status: BMI > 30  Obese Nutritional Risks: None Diabetes: Yes CBG done?: No Did pt. bring in CBG monitor from home?: No  How often do you need to have someone help you when you read instructions, pamphlets, or other written materials from your doctor or pharmacy?: 1 - Never What is the last grade level you completed in school?: 12th grade   Interpreter Needed?: No  Comments: pt lives with spouse Information entered by :: LPinson, LPN  Past Medical History:  Diagnosis Date  . Chronic kidney disease    kidney stones  . GERD (gastroesophageal reflux  disease)   . History of cholelithiasis   . History of kidney stones    ca ox [name] @ [facility]) now [facility]  . History of pneumonia   . HLD (hyperlipidemia)   . HTN (hypertension)   . T2DM (type 2 diabetes mellitus) ([place]) 2010   Past Surgical History:  Procedure Laterality Date  . CARPAL TUNNEL RELEASE Bilateral   . CATARACT EXTRACTION, BILATERAL    . COLONOSCOPY  11/2012   11 adenomatous polyps, diverticulosis, rec rpt 1 yr Ardis Hughs)  . COLONOSCOPY  12/2013   3 polyps, diverticulosis, rec rpt 3 yrs Ardis Hughs)  . KNEE CARTILAGE SURGERY Left   . LITHOTRIPSY    . UMBILICAL HERNIA REPAIR     with mesh   Family History  Problem Relation Age of Onset  . Alzheimer's disease Mother   . Breast cancer Mother        breast  . Atrial fibrillation Mother   . CAD Mother   . Stroke Father 59  . Diabetes Father   . CAD Father   . Rectal cancer Maternal Grandfather        rectal  . Colon cancer Maternal Grandfather 78  . Esophageal cancer Neg Hx   . Stomach cancer Neg Hx    Social History   Socioeconomic History  . Marital status: Married    Spouse name: Not on file  . Number of children: Not on file  . Years of education: Not on file  . Highest education  level: Not on file  Occupational History  . Not on file  Social Needs  . Financial resource strain: Not on file  . Food insecurity:    Worry: Not on file    Inability: Not on file  . Transportation needs:    Medical: Not on file    Non-medical: Not on file  Tobacco Use  . Smoking status: Never Smoker  . Smokeless tobacco: Former Systems developer    Types: Chew  Substance and Sexual Activity  . Alcohol use: No  . Drug use: No  . Sexual activity: Not on file  Lifestyle  . Physical activity:    Days per week: Not on file    Minutes per session: Not on file  . Stress: Not on file  Relationships  . Social connections:    Talks on phone: Not on file    Gets together: Not on file    Attends religious service: Not on file     Active member of club or organization: Not on file    Attends meetings of clubs or organizations: Not on file    Relationship status: Not on file  Other Topics Concern  . 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   Diet: some water, fruits/vegetables daily, avoids potatoes    Outpatient Encounter Medications as of 12/10/2017  Medication Sig  . aspirin 325 MG tablet Take 325 mg by mouth daily.  . Cholecalciferol (VITAMIN D) 2000 UNITS CAPS Take 1 capsule by mouth daily.  Marland Kitchen doxazosin (CARDURA) 2 MG tablet Take 1 tablet (2 mg total) by mouth daily.  . fenofibrate (TRICOR) 145 MG tablet TAKE 1 TABLET BY MOUTH DAILY  . furosemide (LASIX) 20 MG tablet Take 1 tablet (20 mg total) by mouth daily.  . Glucosamine-Chondroitin (OSTEO BI-FLEX REGULAR STRENGTH PO) Take 2 tablets by mouth daily.  Marland Kitchen lisinopril (PRINIVIL,ZESTRIL) 20 MG tablet TAKE 1 TABLET BY MOUTH DAILY  . metFORMIN (GLUCOPHAGE) 500 MG tablet Take 1 tablet (500 mg total) by mouth 2 (two) times daily with a meal.  . Multiple Vitamins-Minerals (MACULAR VITAMIN BENEFIT PO) Take 1 tablet by mouth daily.  . Omega-3 Fatty Acids (FISH OIL) 1200 MG CAPS Take 1 capsule by mouth 3 (three) times daily.  Marland Kitchen omeprazole (PRILOSEC) 40 MG capsule Take 1 capsule (40 mg total) by mouth daily.  . simvastatin (ZOCOR) 20 MG tablet TAKE ONE TABLET BY MOUTH EVERY NIGHT AT BEDTIME  . sitaGLIPtin (JANUVIA) 50 MG tablet Take 1 tablet (50 mg total) by mouth daily. (Patient not taking: Reported on 12/10/2017)   No facility-administered encounter medications on file as of 12/10/2017.     Activities of Daily Living In your present state of health, do you have any difficulty performing the following activities: 12/10/2017  Hearing? N  Vision? N  Difficulty concentrating or making decisions? N  Walking or climbing stairs? N    Dressing or bathing? N  Doing errands, shopping? N  Preparing Food and eating ? N  Using the Toilet? N  In the past six months, have you accidently leaked urine? N  Do you have problems with loss of bowel control? N  Managing your Medications? N  Managing your Finances? N  Housekeeping or managing your Housekeeping? N  Some recent data might be hidden    Patient Care Team: Ria Bush, MD  as PCP - General (Family Medicine) Calvert Cantor, MD as Consulting Physician (Ophthalmology)   Assessment:   This is a routine wellness examination for Sergey.   Hearing Screening   125Hz  250Hz  500Hz  1000Hz  2000Hz  3000Hz  4000Hz  6000Hz  8000Hz   Right ear:   40 0 40  0    Left ear:   40 40 40  0    Vision Screening Comments: Last vision exam in 2018    Exercise Activities and Dietary recommendations Exercise limited by: None identified  Goals    . Follow up with Primary Care Provider     Starting 12/10/2017, I will continue to take medications as prescribed and to keep appointments with PCP as scheduled.        Fall Risk Fall Risk  12/10/2017 11/06/2016 11/03/2015 10/30/2014 10/13/2013  Falls in the past year? No No No No No  Risk for fall due to : - - - - -  Risk for fall due to: Comment - - - - -   Depression Screen PHQ 2/9 Scores 12/10/2017 11/06/2016 11/03/2015 10/30/2014  PHQ - 2 Score 0 0 0 0  PHQ- 9 Score 0 - - -    Cognitive Function MMSE - Mini Mental State Exam 12/10/2017 11/06/2016  Orientation to time 5 5  Orientation to Place 5 5  Registration 3 3  Attention/ Calculation 0 0  Recall 3 3  Language- name 2 objects 0 0  Language- repeat 1 1  Language- follow 3 step command 3 3  Language- read & follow direction 0 0  Write a sentence 0 0  Copy design 0 0  Total score 20 20       PLEASE NOTE: A Mini-Cog screen was completed. Maximum score is 20. A value of 0 denotes this part of [place]stein MMSE was not completed or the patient failed this part of the Mini-Cog screening.    Mini-Cog Screening Orientation to Time - Max 5 pts Orientation to Place - Max 5 pts Registration - Max 3 pts Recall - Max 3 pts Language Repeat - Max 1 pts Language Follow 3 Step Command - Max 3 pts   Immunization History  Administered Date(s) Administered  . Influenza,inj,Quad PF,6+ Mos 06/22/2014  . Influenza-Unspecified 05/26/2013, 06/21/2015, 06/11/2016  . Pneumococcal Conjugate-13 04/28/2014  . Pneumococcal Polysaccharide-23 10/11/2011  . Td 10/11/2011    Screening Tests Health Maintenance  Topic Date Due  . COLON CANCER SCREENING ANNUAL FOBT  09/10/2018 (Originally 01/06/2015)  . COLONOSCOPY  09/10/2018 (Originally 01/05/2017)  . DTaP/Tdap/Td (1 - Tdap) 10/10/2021 (Originally 10/12/2011)  . OPHTHALMOLOGY EXAM  03/11/2018  . INFLUENZA VACCINE  04/11/2018  . HEMOGLOBIN A1C  05/05/2018  . FOOT EXAM  06/14/2018  . TETANUS/TDAP  10/10/2021  . PNA vac Low Risk Adult  Completed     Plan:     I have personally reviewed, addressed, and noted the following in the patient's chart:  A. Medical and social history B. Use of alcohol, tobacco or illicit drugs  C. Current medications and supplements D. Functional ability and status E.  Nutritional status F.  Physical activity G. Advance directives H. List of other physicians I.  Hospitalizations, surgeries, and ER visits in previous 12 months J.  [place] to include hearing, vision, cognitive, depression L. Referrals and appointments - none  In addition, I have reviewed and discussed with patient certain preventive protocols, quality metrics, and best practice recommendations. A written personalized care plan for preventive services as well as general preventive  health recommendations were provided to patient.  See attached scanned questionnaire for additional information.   Signed,   Lindell Noe, MHA, BS, LPN Health Coach

## 2017-12-11 ENCOUNTER — Ambulatory Visit (HOSPITAL_COMMUNITY)
Admission: RE | Admit: 2017-12-11 | Discharge: 2017-12-11 | Disposition: A | Discharge status: Home / Self Care | Payer: Medicare Other | Source: Ambulatory Visit | Attending: Cardiology | Admitting: Cardiology

## 2017-12-11 ENCOUNTER — Telehealth: Payer: Self-pay | Admitting: Adult Health

## 2017-12-11 ENCOUNTER — Other Ambulatory Visit: Payer: Self-pay | Admitting: Family Medicine

## 2017-12-11 DIAGNOSIS — R9439 Abnormal result of other cardiovascular function study: Secondary | ICD-10-CM | POA: Diagnosis not present

## 2017-12-11 DIAGNOSIS — N189 Chronic kidney disease, unspecified: Secondary | ICD-10-CM | POA: Insufficient documentation

## 2017-12-11 DIAGNOSIS — R0600 Dyspnea, unspecified: Secondary | ICD-10-CM | POA: Diagnosis not present

## 2017-12-11 DIAGNOSIS — I2510 Atherosclerotic heart disease of native coronary artery without angina pectoris: Secondary | ICD-10-CM | POA: Insufficient documentation

## 2017-12-11 DIAGNOSIS — R0602 Shortness of breath: Secondary | ICD-10-CM | POA: Insufficient documentation

## 2017-12-11 DIAGNOSIS — R079 Chest pain, unspecified: Secondary | ICD-10-CM

## 2017-12-11 DIAGNOSIS — Z6839 Body mass index (BMI) 39.0-39.9, adult: Secondary | ICD-10-CM | POA: Insufficient documentation

## 2017-12-11 DIAGNOSIS — Z8249 Family history of ischemic heart disease and other diseases of the circulatory system: Secondary | ICD-10-CM | POA: Diagnosis not present

## 2017-12-11 DIAGNOSIS — E1122 Type 2 diabetes mellitus with diabetic chronic kidney disease: Secondary | ICD-10-CM | POA: Diagnosis not present

## 2017-12-11 DIAGNOSIS — E669 Obesity, unspecified: Secondary | ICD-10-CM | POA: Diagnosis not present

## 2017-12-11 DIAGNOSIS — I129 Hypertensive chronic kidney disease with stage 1 through stage 4 chronic kidney disease, or unspecified chronic kidney disease: Secondary | ICD-10-CM | POA: Diagnosis not present

## 2017-12-11 DIAGNOSIS — R5383 Other fatigue: Secondary | ICD-10-CM | POA: Insufficient documentation

## 2017-12-11 MED ORDER — TECHNETIUM TC 99M TETROFOSMIN IV KIT
29.1000 | PACK | Freq: Once | INTRAVENOUS | Status: AC | PRN
  Administered 2017-12-11: 29.1 via INTRAVENOUS
  Filled 2017-12-11: qty 30

## 2017-12-11 NOTE — Telephone Encounter (Signed)
Spoke with Pt and informed him that he does not have to come into the office tomorrow to have his labs drawn. Pt had them drawn at PCP. Labs resulted in epic. Routing result to Bunnie Domino, DNP

## 2017-12-11 NOTE — Telephone Encounter (Signed)
New Message:   Pt is scheduled to have lab work next week.His wife wants to know if he needs it,he just had lab work yesterday at his primary doctor?

## 2017-12-12 ENCOUNTER — Ambulatory Visit (HOSPITAL_COMMUNITY)
Admission: RE | Admit: 2017-12-12 | Discharge: 2017-12-12 | Disposition: A | Discharge status: Home / Self Care | Payer: Medicare Other | Source: Ambulatory Visit | Attending: Cardiovascular Disease | Admitting: Cardiovascular Disease

## 2017-12-12 LAB — MYOCARDIAL PERFUSION IMAGING
CHL CUP MPHR: 145 {beats}/min
CHL CUP RESTING HR STRESS: 78 {beats}/min
CHL RATE OF PERCEIVED EXERTION: 19
CSEPEDS: 55 s
CSEPPHR: 300 {beats}/min
Estimated workload: 4.6 METS
Exercise duration (min): 2 min
LVDIAVOL: 163 mL (ref 62–150)
LVSYSVOL: 80 mL
Percent HR: 206 %
SDS: 2
SRS: 1
SSS: 3
TID: 0.94

## 2017-12-12 MED ORDER — TECHNETIUM TC 99M TETROFOSMIN IV KIT
30.8000 | PACK | Freq: Once | INTRAVENOUS | Status: AC | PRN
  Administered 2017-12-12: 30.8 via INTRAVENOUS

## 2017-12-12 NOTE — Telephone Encounter (Signed)
Thank you :)

## 2017-12-13 NOTE — Progress Notes (Signed)
I reviewed health advisor's note, was available for consultation, and agree with documentation and plan.  

## 2017-12-18 ENCOUNTER — Encounter: Payer: Self-pay | Admitting: Family Medicine

## 2017-12-18 ENCOUNTER — Ambulatory Visit (INDEPENDENT_AMBULATORY_CARE_PROVIDER_SITE_OTHER): Payer: Medicare Other | Admitting: Family Medicine

## 2017-12-18 VITALS — BP 142/74 | HR 54 | Temp 97.5°F | Wt 279.8 lb

## 2017-12-18 DIAGNOSIS — I099 Rheumatic heart disease, unspecified: Secondary | ICD-10-CM | POA: Diagnosis not present

## 2017-12-18 DIAGNOSIS — Z7189 Other specified counseling: Secondary | ICD-10-CM

## 2017-12-18 DIAGNOSIS — Z66 Do not resuscitate: Secondary | ICD-10-CM

## 2017-12-18 DIAGNOSIS — Z0000 Encounter for general adult medical examination without abnormal findings: Secondary | ICD-10-CM | POA: Diagnosis not present

## 2017-12-18 DIAGNOSIS — N400 Enlarged prostate without lower urinary tract symptoms: Secondary | ICD-10-CM | POA: Diagnosis not present

## 2017-12-18 DIAGNOSIS — E1140 Type 2 diabetes mellitus with diabetic neuropathy, unspecified: Secondary | ICD-10-CM

## 2017-12-18 DIAGNOSIS — R0600 Dyspnea, unspecified: Secondary | ICD-10-CM

## 2017-12-18 DIAGNOSIS — E785 Hyperlipidemia, unspecified: Secondary | ICD-10-CM

## 2017-12-18 NOTE — Assessment & Plan Note (Signed)
Preventative protocols reviewed and updated unless pt declined. Discussed healthy diet and lifestyle.  

## 2017-12-18 NOTE — Assessment & Plan Note (Signed)
Chronic, stable. Continue on metformin, off [place]. May restart pending cards eval.

## 2017-12-18 NOTE — Assessment & Plan Note (Addendum)
Advanced directive: would wantdaughter RNto beHCPOA.Saw lawyer and completed.Asked to bring us copy. Does not want prolonged life support. Pt wants to be DNR/DNI. 

## 2017-12-18 NOTE — Patient Instructions (Addendum)
Keep cardiology appointment tomorrow Bring me copy of advanced directive at your convenience.   Restart januvia as long as ok by cardiology.

## 2017-12-19 ENCOUNTER — Encounter: Payer: Self-pay | Admitting: Adult Health

## 2017-12-19 ENCOUNTER — Other Ambulatory Visit: Payer: Self-pay | Admitting: Adult Health

## 2017-12-19 ENCOUNTER — Ambulatory Visit: Payer: Medicare Other | Admitting: Adult Health

## 2017-12-19 VITALS — BP 134/82 | HR 77 | Ht 71.0 in | Wt 282.0 lb

## 2017-12-19 DIAGNOSIS — R9431 Abnormal electrocardiogram [ECG] [EKG]: Secondary | ICD-10-CM | POA: Diagnosis not present

## 2017-12-19 DIAGNOSIS — E7800 Pure hypercholesterolemia, unspecified: Secondary | ICD-10-CM

## 2017-12-19 DIAGNOSIS — I10 Essential (primary) hypertension: Secondary | ICD-10-CM | POA: Diagnosis not present

## 2017-12-19 DIAGNOSIS — Z79899 Other long term (current) drug therapy: Secondary | ICD-10-CM

## 2017-12-19 DIAGNOSIS — E119 Type 2 diabetes mellitus without complications: Secondary | ICD-10-CM

## 2017-12-19 DIAGNOSIS — R931 Abnormal findings on diagnostic imaging of heart and coronary circulation: Secondary | ICD-10-CM

## 2017-12-19 NOTE — H&P (View-Only) (Signed)
Cardiology Office Note   Date:  12/19/2017   ID:  [name] Luna., DOB April 14, 1941, MRN [id number]  PCP:  Ria Bush, MD  Cardiologist: Dr. Percival Spanish Chief Complaint  Patient presents with  . Chest Pain  . Abnormal ECG     History of Present Illness: [name] Young. is a 77 y.o. male who presents for ongoing assessment and management of recurrent dyspnea who was seen as a new patient on 12/05/2017.  The patient was complaining of PND and was sleeping in recliner.  Other history includes type 2 diabetes, hypertension, hypercholesterolemia, arthritis, and GERD.  Due to symptoms he was scheduled for a nuclear medicine stress test.  We also discussed sleep study for OSA but he refused stating he was claustrophobic and would not wear CPAP if it was indicated.  Patient underwent nuclear medicine stress test on 12/12/2017.  This was found to be abnormal, with a medium defect of moderate severity present in the basal inferior lateral and mid inferior lateral location.  The findings were consistent with ischemia.  This was found to be an intermediate risk study.  Stress test EF was 51% with inferior lateral hypokinesis.  Left ventricular ejection fraction was mildly decreased at 45% to 54%.  He is here to discuss further invasive procedure to rule out CAD in the setting of abnormal stress test.Most recent labs included creatinine of 1.42.  He comes today stating he is more fatigued, shortness of breath has improved with addition of Lasix.   Past Medical History:  Diagnosis Date  . Chronic kidney disease    kidney stones  . GERD (gastroesophageal reflux disease)   . History of cholelithiasis   . History of kidney stones    ca ox [name] @ [facility]) now [place]  . History of pneumonia   . HLD (hyperlipidemia)   . HTN (hypertension)   . T2DM (type 2 diabetes mellitus) ([place]) 2010    Past Surgical History:  Procedure Laterality Date  . CARPAL TUNNEL RELEASE Bilateral   .  CATARACT EXTRACTION, BILATERAL    . COLONOSCOPY  11/2012   11 adenomatous polyps, diverticulosis, rec rpt 1 yr Ardis Hughs)  . COLONOSCOPY  12/2013   3 polyps, diverticulosis, rec rpt 3 yrs Ardis Hughs)  . KNEE CARTILAGE SURGERY Left   . LITHOTRIPSY    . UMBILICAL HERNIA REPAIR     with mesh     Current Outpatient Medications  Medication Sig Dispense Refill  . aspirin 325 MG tablet Take 325 mg by mouth daily.    . Cholecalciferol (VITAMIN D) 2000 UNITS CAPS Take 1 capsule by mouth daily.    Marland Kitchen doxazosin (CARDURA) 2 MG tablet Take 1 tablet (2 mg total) by mouth daily. 30 tablet 3  . fenofibrate (TRICOR) 145 MG tablet TAKE 1 TABLET BY MOUTH ONCE DAILY 30 tablet 4  . furosemide (LASIX) 20 MG tablet Take 1 tablet (20 mg total) by mouth daily. 30 tablet 3  . Glucosamine-Chondroitin (OSTEO BI-FLEX REGULAR STRENGTH PO) Take 2 tablets by mouth daily.    Marland Kitchen lisinopril (PRINIVIL,ZESTRIL) 20 MG tablet TAKE 1 TABLET BY MOUTH DAILY 90 tablet 3  . metFORMIN (GLUCOPHAGE) 500 MG tablet Take 1 tablet (500 mg total) by mouth 2 (two) times daily with a meal. 180 tablet 3  . Multiple Vitamins-Minerals (MACULAR VITAMIN BENEFIT PO) Take 1 tablet by mouth daily.    . Omega-3 Fatty Acids (FISH OIL) 1200 MG CAPS Take 1 capsule by mouth 3 (three) times daily.    Marland Kitchen  omeprazole (PRILOSEC) 40 MG capsule Take 1 capsule (40 mg total) by mouth daily. 30 capsule 11  . simvastatin (ZOCOR) 20 MG tablet TAKE ONE TABLET BY MOUTH EVERY NIGHT AT BEDTIME 90 tablet 3  . sitaGLIPtin (JANUVIA) 50 MG tablet Take 1 tablet (50 mg total) by mouth daily. 30 tablet 11   No current facility-administered medications for this visit.     Allergies:   Patient has no known allergies.    Social History:  The patient  reports that he has never smoked. He quit smokeless tobacco use about 16 years ago. His smokeless tobacco use included chew. He reports that he does not drink alcohol or use drugs.   Family History:  The patient's family history  includes Alzheimer's disease in his mother; Atrial fibrillation in his mother; Breast cancer in his mother; CAD in his father and mother; Colon cancer (age of onset: 45) in his maternal grandfather; Diabetes in his father; Rectal cancer in his maternal grandfather; Stroke (age of onset: 47) in his father.    ROS: All other systems are reviewed and negative. Unless otherwise mentioned in H&P    PHYSICAL EXAM: VS:  BP 134/82   Pulse 77   Ht 5\' 11"  (1.803 m)   Wt 282 lb (127.9 kg)   BMI 39.33 kg/m  , BMI Body mass index is 39.33 kg/m. GEN: Well nourished, well developed, in no acute distress  HEENT: normal  Neck: no JVD, carotid bruits, or masses Cardiac: RRR; occasional extrasystole no murmurs, rubs, or gallops,no edema  Respiratory:  clear to auscultation bilaterally, normal work of breathing GI: soft, nontender, nondistended, + BS, obese MS: no deformity or atrophy  Skin: warm and dry, no rash Neuro:  Strength and sensation are intact Psych: euthymic mood, full affect   EKG: Normal sinus rhythm, frequent PVCs, possible left atrial enlargement, nonspecific ST abnormality is noted laterally and inferior.  Heart rate of 77 bpm  Recent Labs: 11/05/2017: ALT 18; Pro B Natriuretic peptide (BNP) 158.0 12/10/2017: BUN 28; Creatinine, Ser 1.42; Potassium 4.3; Sodium 143    Lipid Panel    Component Value Date/Time   CHOL 117 12/10/2017 0802   CHOL 178 02/01/2010   TRIG 159.0 (H) 12/10/2017 0802   TRIG 812 02/01/2010   HDL 26.40 (L) 12/10/2017 0802   CHOLHDL 4 12/10/2017 0802   VLDL 31.8 12/10/2017 0802   LDLCALC 59 12/10/2017 0802   LDLDIRECT 67.0 06/11/2017 0752      Wt Readings from Last 3 Encounters:  12/19/17 282 lb (127.9 kg)  12/18/17 279 lb 12 oz (126.9 kg)  12/11/17 284 lb (128.8 kg)      Other studies Reviewed: Study Highlights 12/12/2017     The left ventricular ejection fraction is mildly decreased (45-54%).  Nuclear stress EF: 51%. Inferolateral  hypokinesis.  There was no ST segment deviation noted during stress. Frequent PVCs noted  Defect 1: There is a medium defect of moderate severity present in the basal inferolateral and mid inferolateral location.  Findings consistent with ischemia.  This is an intermediate risk study. Abnormal study   Echocardiogram 11/23/2017 Left ventricle: The cavity size was normal. There was mild   concentric hypertrophy. Systolic function was normal. The   estimated ejection fraction was in the range of 50% to 55%. Wall   motion was normal; there were no regional wall motion   abnormalities. Doppler parameters are consistent with abnormal   left ventricular relaxation (grade 1 diastolic dysfunction). - Aortic valve: Transvalvular velocity  was within the normal range.   There was no stenosis. There was no regurgitation. - Mitral valve: Moderately thickened, moderately calcified anterior   leaflet. , consistent with rheumatic disease. Transvalvular   velocity was within the normal range. There was no evidence for   stenosis. There was trivial regurgitation. - Right ventricle: The cavity size was normal. Wall thickness was   normal. Systolic function was normal. - Atrial septum: No defect or patent foramen ovale was identified   by color flow Doppler. - Tricuspid valve: There was trivial regurgitation. - Pulmonary arteries: Systolic pressure was within the normal   range. PA peak pressure: 20 mm Hg (S).  ASSESSMENT AND PLAN:  1.  Chest pain and dyspnea: Abnormal stress Myoview revealing inferior basilar, inferior lateral reversibility with evidence of ischemia.  I have discussed proceeding with cardiac catheterization with the patient and his wife.  I have described the procedure and answered multiple questions.  The patient is willing to proceed.  I have discussed this with Dr. Gwenlyn Found who is DOD on site today.  He is in agreement with my assessment and plan.  The patient understands that risks  include but are not limited to stroke (1 in 1000), death (1 in 21), kidney failure [usually temporary] (1 in 500), bleeding (1 in 200), allergic reaction [possibly serious] (1 in 200), and agrees to proceed.   Of note, the patient is very claustrophobic.  He is requesting some sedation while undergoing catheterization.  This will also be placed in orders.  2.  Hypercholesterolemia: The patient continues on TriCor, and simvastatin 20 mg by mouth daily.  Will likely need to transition to high-dose statin especially if there is stenosis and stent placement during catheterization.  3.  Hypertension: Blood pressures currently well controlled on medication regimen.  He is on ACE inhibitor, lisinopril 20 mg daily.  Recent labs as above with a creatinine of 1.42.  Repeat labs are being completed prior to cath.  4.  History of OSA: Not interested in CPAP due to claustrophobia.  5.  Type 2 diabetes: Followed by PCP.  Metformin will be held post catheterization and morning of cath as he will be n.p.o.  Current medicines are reviewed at length with the patient today.    Labs/ tests ordered today include: Cardiac catheterization, cords with possible PCI.  [place] labs. Abnormal cardiac stress test Phill Myron. West Pugh, ANP, AACC   12/19/2017 12:08 PM    [facility] [address], [place], [place] [address] Phone: [phone]; Fax: [phone]

## 2017-12-19 NOTE — Assessment & Plan Note (Signed)
Chronic, continue tricor and simvastatin. The ASCVD Risk score Mikey Bussing DC Jr., et al., 2013) failed to calculate for the following reasons:   The valid total cholesterol range is 130 to 320 mg/dL

## 2017-12-19 NOTE — Assessment & Plan Note (Signed)
Again confirmed desired DNR/DNI status.

## 2017-12-19 NOTE — Patient Instructions (Signed)
     [facility] [address] [place] [address] Dept: [phone] Loc: [phone]  [name] Young.  12/19/2017  You are scheduled for a Cardiac Catheterization on Friday, April 12 with Dr. Harrell Gave End.  1. Please arrive at the [facility] (Main Entrance A) at [facility]: [place], [place] [address] at 5:00 AM (two hours before your procedure to ensure your preparation). Free valet parking service is available.   Special note: Every effort is made to have your procedure done on time. Please understand that emergencies sometimes delay scheduled procedures.  2. Diet: Do not eat or drink anything after midnight prior to your procedure except sips of water to take medications.  3. Labs: You will need to have blood drawn on Wednesday, April 10 at [facility]  Open: [place] (Lunch 12:30 - 1:30)   Phone: [phone]. You do not need to be fasting.  4. Medication instructions in preparation for your procedure:  Stop taking, Glucophage (Metformin) on Thursday, April 11.    Stop taking Lasix the morning of the Cath Friday, April 12  On the morning of your procedure, take your Aspirin and any morning medicines NOT listed above.  You may use sips of water.  5. Plan for one night stay--bring personal belongings. 6. Bring a current list of your medications and current insurance cards. 7. You MUST have a responsible person to drive you home. 8. Someone MUST be with you the first 24 hours after you arrive home or your discharge will be delayed. 9. Please wear clothes that are easy to get on and off and wear slip-on shoes.  Thank you for allowing [place] to care for you!   -- [facility]

## 2017-12-19 NOTE — Assessment & Plan Note (Signed)
PSA stable. DRE deferred today.

## 2017-12-19 NOTE — Assessment & Plan Note (Signed)
Reviewed recent echo with patient.

## 2017-12-19 NOTE — Progress Notes (Signed)
BP (!) 142/74 (BP Location: Right Arm, Patient Position: Sitting, Cuff Size: Large)   Pulse (!) 54 Comment: Irregular  Temp (!) 97.5 F (36.4 C) (Oral)   Wt 279 lb 12 oz (126.9 kg)   SpO2 98%   BMI 39.02 kg/m    CC: CPE Subjective:    Patient ID: [name] Young., male    DOB: Jun 20, 1941, 77 y.o.   MRN: [id number]  HPI: [name] Young. is a 77 y.o. male presenting on 12/18/2017 for Annual Exam (Part 2)   Recent markedly abnormal stress test - pending f/u with cards to discuss further treatment tomorrow. EF 45-54%, inferolateral hypokinesis, frequent PVCs, medium defect of moderate severity basal inferolateral and mid inferolateral location consistent with ischemia.  Echo 11/2017 - EF 50-55%, normal wall motion, grade 1 DD, rheumatic mitral valve without stenosis or significant regurg. Denies recent chest pain/tightness or dyspnea.   Saw Lesia last week for medicare wellness visit. Note reviewed.    Preventative: COLONOSCOPY Date: 12/2013 3 polyps, diverticulosis, rec rpt 3 yrs Ardis Hughs). Pt wants to wait until 12/2017.will defer until heart evaluation.  Prostate - yearly check- PSA normal. Will defer DRE, consider next year. Flu shot - yearly  Pneumovax 09/2011, prevnar 04/2014 Td 09/2011 zostavax - declines  shingrix - declines Advanced directive: would want daughter RN to be HCPOA. Saw lawyer and completed. Asked to bring [place] copy. Does not want prolonged life support. Pt wants to be DNR/DNI.  Seat belt use discussed Sunscreen use discussed. No changing moles on skin. 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 Interim medical history since our last visit  reviewed. Allergies and medications reviewed and updated. Outpatient Medications Prior to Visit  Medication Sig Dispense Refill  . aspirin 325 MG tablet Take 325 mg by mouth daily.    . Cholecalciferol (VITAMIN D) 2000 UNITS CAPS Take 1 capsule by mouth daily.    Marland Kitchen doxazosin (CARDURA) 2 MG tablet Take 1 tablet (2 mg total) by mouth daily. 30 tablet 3  . fenofibrate (TRICOR) 145 MG tablet TAKE 1 TABLET BY MOUTH ONCE DAILY 30 tablet 4  . furosemide (LASIX) 20 MG tablet Take 1 tablet (20 mg total) by mouth daily. 30 tablet 3  . Glucosamine-Chondroitin (OSTEO BI-FLEX REGULAR STRENGTH PO) Take 2 tablets by mouth daily.    Marland Kitchen lisinopril (PRINIVIL,ZESTRIL) 20 MG tablet TAKE 1 TABLET BY MOUTH DAILY 90 tablet 3  . metFORMIN (GLUCOPHAGE) 500 MG tablet Take 1 tablet (500 mg total) by mouth 2 (two) times daily with a meal. 180 tablet 3  . Multiple Vitamins-Minerals (MACULAR VITAMIN BENEFIT PO) Take 1 tablet by mouth daily.    . Omega-3 Fatty Acids (FISH OIL) 1200 MG CAPS Take 1 capsule by mouth 3 (three) times daily.    Marland Kitchen omeprazole (PRILOSEC) 40 MG capsule Take 1 capsule (40 mg total) by mouth daily. 30 capsule 11  . simvastatin (ZOCOR) 20 MG tablet TAKE ONE TABLET BY MOUTH EVERY NIGHT AT BEDTIME 90 tablet 3  . sitaGLIPtin (JANUVIA) 50 MG tablet Take 1 tablet (50 mg total) by mouth daily. (Patient not taking: Reported on 12/18/2017) 30 tablet 11   No facility-administered medications prior to visit.  Per HPI unless specifically indicated in ROS section below Review of Systems  Constitutional: Negative for activity change, appetite change, chills, fatigue, fever and unexpected weight change.  HENT: Positive for rhinorrhea. Negative for hearing loss.   Eyes: Negative for visual disturbance.  Respiratory: Positive for cough and shortness of breath. Negative for chest tightness and wheezing.   Cardiovascular: Positive for leg swelling. Negative for chest pain and palpitations.  Gastrointestinal:  Negative for abdominal distention, abdominal pain, blood in stool, constipation, diarrhea, nausea and vomiting.  Genitourinary: Negative for difficulty urinating and hematuria.  Musculoskeletal: Negative for arthralgias, myalgias and neck pain.  Skin: Negative for rash.  Neurological: Negative for dizziness, seizures, syncope and headaches.  Hematological: Negative for adenopathy. Does not bruise/bleed easily.  Psychiatric/Behavioral: Negative for dysphoric mood. The patient is not nervous/anxious.        Objective:    BP (!) 142/74 (BP Location: Right Arm, Patient Position: Sitting, Cuff Size: Large)   Pulse (!) 54 Comment: Irregular  Temp (!) 97.5 F (36.4 C) (Oral)   Wt 279 lb 12 oz (126.9 kg)   SpO2 98%   BMI 39.02 kg/m   Wt Readings from Last 3 Encounters:  12/18/17 279 lb 12 oz (126.9 kg)  12/11/17 284 lb (128.8 kg)  12/10/17 280 lb 4 oz (127.1 kg)    Physical Exam  Constitutional: He is oriented to person, place, and time. He appears well-developed and well-nourished. No distress.  HENT:  Head: Normocephalic and atraumatic.  Right Ear: Hearing, tympanic membrane, external ear and ear canal normal.  Left Ear: Hearing, tympanic membrane, external ear and ear canal normal.  Nose: Nose normal.  Mouth/Throat: Uvula is midline, oropharynx is clear and moist and mucous membranes are normal. No oropharyngeal exudate, posterior oropharyngeal edema or posterior oropharyngeal erythema.  Eyes: Pupils are equal, round, and reactive to light. Conjunctivae and EOM are normal. No scleral icterus.  Neck: Normal range of motion. Neck supple.  Cardiovascular: Normal rate, regular rhythm, normal heart sounds and intact distal pulses.  No murmur heard. Pulses:      Radial pulses are 2+ on the right side, and 2+ on the left side.  Pulmonary/Chest: Effort normal and breath sounds normal. No respiratory distress. He has no wheezes. He has no rales.  Abdominal: Soft. Bowel sounds are normal. He  exhibits no distension and no mass. There is no tenderness. There is no rebound and no guarding.  Musculoskeletal: Normal range of motion. He exhibits no edema.  Lymphadenopathy:    He has no cervical adenopathy.  Neurological: He is alert and oriented to person, place, and time.  CN grossly intact, station and gait intact  Skin: Skin is warm and dry. No rash noted.  Psychiatric: He has a normal mood and affect. His behavior is normal. Judgment and thought content normal.  Nursing note and vitals reviewed.  Results for orders placed or performed during the hospital encounter of 12/11/17  MYOCARDIAL PERFUSION IMAGING  Result Value Ref Range   Rest HR 78 bpm   Rest BP 157/92 mmHg   RPE 19    Exercise duration (sec) 55 sec   Percent HR 206 %   Exercise duration (min) 2 min   Estimated workload 4.6 METS   Peak HR 300 bpm   Peak BP 207/117 mmHg   MPHR 145 bpm   SSS 3    SRS 1    SDS 2    TID 0.94    LV sys vol 80 mL  LV dias vol 163 62 - 150 mL   Lab Results  Component Value Date   CHOL 117 12/10/2017   HDL 26.40 (L) 12/10/2017   LDLCALC 59 12/10/2017   LDLDIRECT 67.0 06/11/2017   TRIG 159.0 (H) 12/10/2017   CHOLHDL 4 12/10/2017    Lab Results  Component Value Date   CREATININE 1.42 12/10/2017   BUN 28 (H) 12/10/2017   NA 143 12/10/2017   K 4.3 12/10/2017   CL 106 12/10/2017   CO2 29 12/10/2017    Lab Results  Component Value Date   PSA 0.83 11/05/2017   PSA 0.66 11/06/2016   PSA 0.77 11/01/2015       Assessment & Plan:   Problem List Items Addressed This Visit    Advanced care planning/counseling discussion    Advanced directive: would want daughter RN to be HCPOA. Saw lawyer and completed. Asked to bring [place] copy. Does not want prolonged life support. Pt wants to be DNR/DNI.       Benign prostatic hyperplasia    PSA stable. DRE deferred today.       Chronic inactive rheumatic heart disease    Reviewed recent echo with patient.       DNR (do not  resuscitate)    Again confirmed desired DNR/DNI status.       Dyslipidemia    Chronic, continue tricor and simvastatin. The ASCVD Risk score Mikey Bussing DC Jr., et al., 2013) failed to calculate for the following reasons:   The valid total cholesterol range is 130 to 320 mg/dL       Dyspnea    Appreciate cards care - undergoing workup for possible ischemic cause.       Health maintenance examination - Primary    Preventative protocols reviewed and updated unless pt declined. Discussed healthy diet and lifestyle.       Obesity, Class III, BMI 40-49.9 (morbid obesity) ([place])    Encouraged healthy diet and lifestyle for sustainable weight loss      Type 2 diabetes, controlled, with neuropathy (HCC)    Chronic, stable. Continue on metformin, off [place]. May restart pending cards eval.           No orders of the defined types were placed in this encounter.  No orders of the defined types were placed in this encounter.   Follow up plan: Return in about 6 months (around 06/19/2018) for follow up visit.  Ria Bush, MD

## 2017-12-19 NOTE — Assessment & Plan Note (Signed)
Encouraged healthy diet and lifestyle for sustainable weight loss.  

## 2017-12-19 NOTE — ACP (Advance Care Planning) (Signed)
Advanced directive: would wantdaughter RNto beHCPOA.Saw lawyer and completed.Asked to bring us copy. Does not want prolonged life support. Pt wants to be DNR/DNI. 

## 2017-12-19 NOTE — Progress Notes (Signed)
Cardiology Office Note   Date:  12/19/2017   ID:  [name] Luna., DOB 1941-02-08, MRN [id number]  PCP:  Ria Bush, MD  Cardiologist: Dr. Percival Spanish Chief Complaint  Patient presents with  . Chest Pain  . Abnormal ECG     History of Present Illness: [name] Westervelt. is a 77 y.o. male who presents for ongoing assessment and management of recurrent dyspnea who was seen as a new patient on 12/05/2017.  The patient was complaining of PND and was sleeping in recliner.  Other history includes type 2 diabetes, hypertension, hypercholesterolemia, arthritis, and GERD.  Due to symptoms he was scheduled for a nuclear medicine stress test.  We also discussed sleep study for OSA but he refused stating he was claustrophobic and would not wear CPAP if it was indicated.  Patient underwent nuclear medicine stress test on 12/12/2017.  This was found to be abnormal, with a medium defect of moderate severity present in the basal inferior lateral and mid inferior lateral location.  The findings were consistent with ischemia.  This was found to be an intermediate risk study.  Stress test EF was 51% with inferior lateral hypokinesis.  Left ventricular ejection fraction was mildly decreased at 45% to 54%.  He is here to discuss further invasive procedure to rule out CAD in the setting of abnormal stress test.Most recent labs included creatinine of 1.42.  He comes today stating he is more fatigued, shortness of breath has improved with addition of Lasix.   Past Medical History:  Diagnosis Date  . Chronic kidney disease    kidney stones  . GERD (gastroesophageal reflux disease)   . History of cholelithiasis   . History of kidney stones    ca ox [name] @ [facility]) now [place]  . History of pneumonia   . HLD (hyperlipidemia)   . HTN (hypertension)   . T2DM (type 2 diabetes mellitus) ([place]) 2010    Past Surgical History:  Procedure Laterality Date  . CARPAL TUNNEL RELEASE Bilateral   .  CATARACT EXTRACTION, BILATERAL    . COLONOSCOPY  11/2012   12 adenomatous polyps, diverticulosis, rec rpt 1 yr Ardis Hughs)  . COLONOSCOPY  12/2013   3 polyps, diverticulosis, rec rpt 3 yrs Ardis Hughs)  . KNEE CARTILAGE SURGERY Left   . LITHOTRIPSY    . UMBILICAL HERNIA REPAIR     with mesh     Current Outpatient Medications  Medication Sig Dispense Refill  . aspirin 325 MG tablet Take 325 mg by mouth daily.    . Cholecalciferol (VITAMIN D) 2000 UNITS CAPS Take 1 capsule by mouth daily.    Marland Kitchen doxazosin (CARDURA) 2 MG tablet Take 1 tablet (2 mg total) by mouth daily. 30 tablet 3  . fenofibrate (TRICOR) 145 MG tablet TAKE 1 TABLET BY MOUTH ONCE DAILY 30 tablet 4  . furosemide (LASIX) 20 MG tablet Take 1 tablet (20 mg total) by mouth daily. 30 tablet 3  . Glucosamine-Chondroitin (OSTEO BI-FLEX REGULAR STRENGTH PO) Take 2 tablets by mouth daily.    Marland Kitchen lisinopril (PRINIVIL,ZESTRIL) 20 MG tablet TAKE 1 TABLET BY MOUTH DAILY 90 tablet 3  . metFORMIN (GLUCOPHAGE) 500 MG tablet Take 1 tablet (500 mg total) by mouth 2 (two) times daily with a meal. 180 tablet 3  . Multiple Vitamins-Minerals (MACULAR VITAMIN BENEFIT PO) Take 1 tablet by mouth daily.    . Omega-3 Fatty Acids (FISH OIL) 1200 MG CAPS Take 1 capsule by mouth 3 (three) times daily.    Marland Kitchen  omeprazole (PRILOSEC) 40 MG capsule Take 1 capsule (40 mg total) by mouth daily. 30 capsule 11  . simvastatin (ZOCOR) 20 MG tablet TAKE ONE TABLET BY MOUTH EVERY NIGHT AT BEDTIME 90 tablet 3  . sitaGLIPtin (JANUVIA) 50 MG tablet Take 1 tablet (50 mg total) by mouth daily. 30 tablet 11   No current facility-administered medications for this visit.     Allergies:   Patient has no known allergies.    Social History:  The patient  reports that he has never smoked. He quit smokeless tobacco use about 16 years ago. His smokeless tobacco use included chew. He reports that he does not drink alcohol or use drugs.   Family History:  The patient's family history  includes Alzheimer's disease in his mother; Atrial fibrillation in his mother; Breast cancer in his mother; CAD in his father and mother; Colon cancer (age of onset: 25) in his maternal grandfather; Diabetes in his father; Rectal cancer in his maternal grandfather; Stroke (age of onset: 26) in his father.    ROS: All other systems are reviewed and negative. Unless otherwise mentioned in H&P    PHYSICAL EXAM: VS:  BP 134/82   Pulse 77   Ht 5\' 11"  (1.803 m)   Wt 282 lb (127.9 kg)   BMI 39.33 kg/m  , BMI Body mass index is 39.33 kg/m. GEN: Well nourished, well developed, in no acute distress  HEENT: normal  Neck: no JVD, carotid bruits, or masses Cardiac: RRR; occasional extrasystole no murmurs, rubs, or gallops,no edema  Respiratory:  clear to auscultation bilaterally, normal work of breathing GI: soft, nontender, nondistended, + BS, obese MS: no deformity or atrophy  Skin: warm and dry, no rash Neuro:  Strength and sensation are intact Psych: euthymic mood, full affect   EKG: Normal sinus rhythm, frequent PVCs, possible left atrial enlargement, nonspecific ST abnormality is noted laterally and inferior.  Heart rate of 77 bpm  Recent Labs: 11/05/2017: ALT 18; Pro B Natriuretic peptide (BNP) 158.0 12/10/2017: BUN 28; Creatinine, Ser 1.42; Potassium 4.3; Sodium 143    Lipid Panel    Component Value Date/Time   CHOL 117 12/10/2017 0802   CHOL 178 02/01/2010   TRIG 159.0 (H) 12/10/2017 0802   TRIG 812 02/01/2010   HDL 26.40 (L) 12/10/2017 0802   CHOLHDL 4 12/10/2017 0802   VLDL 31.8 12/10/2017 0802   LDLCALC 59 12/10/2017 0802   LDLDIRECT 67.0 06/11/2017 0752      Wt Readings from Last 3 Encounters:  12/19/17 282 lb (127.9 kg)  12/18/17 279 lb 12 oz (126.9 kg)  12/11/17 284 lb (128.8 kg)      Other studies Reviewed: Study Highlights 12/12/2017     The left ventricular ejection fraction is mildly decreased (45-54%).  Nuclear stress EF: 51%. Inferolateral  hypokinesis.  There was no ST segment deviation noted during stress. Frequent PVCs noted  Defect 1: There is a medium defect of moderate severity present in the basal inferolateral and mid inferolateral location.  Findings consistent with ischemia.  This is an intermediate risk study. Abnormal study   Echocardiogram 11/23/2017 Left ventricle: The cavity size was normal. There was mild   concentric hypertrophy. Systolic function was normal. The   estimated ejection fraction was in the range of 50% to 55%. Wall   motion was normal; there were no regional wall motion   abnormalities. Doppler parameters are consistent with abnormal   left ventricular relaxation (grade 1 diastolic dysfunction). - Aortic valve: Transvalvular velocity  was within the normal range.   There was no stenosis. There was no regurgitation. - Mitral valve: Moderately thickened, moderately calcified anterior   leaflet. , consistent with rheumatic disease. Transvalvular   velocity was within the normal range. There was no evidence for   stenosis. There was trivial regurgitation. - Right ventricle: The cavity size was normal. Wall thickness was   normal. Systolic function was normal. - Atrial septum: No defect or patent foramen ovale was identified   by color flow Doppler. - Tricuspid valve: There was trivial regurgitation. - Pulmonary arteries: Systolic pressure was within the normal   range. PA peak pressure: 20 mm Hg (S).  ASSESSMENT AND PLAN:  1.  Chest pain and dyspnea: Abnormal stress Myoview revealing inferior basilar, inferior lateral reversibility with evidence of ischemia.  I have discussed proceeding with cardiac catheterization with the patient and his wife.  I have described the procedure and answered multiple questions.  The patient is willing to proceed.  I have discussed this with Dr. Gwenlyn Found who is DOD on site today.  He is in agreement with my assessment and plan.  The patient understands that risks  include but are not limited to stroke (1 in 1000), death (1 in 92), kidney failure [usually temporary] (1 in 500), bleeding (1 in 200), allergic reaction [possibly serious] (1 in 200), and agrees to proceed.   Of note, the patient is very claustrophobic.  He is requesting some sedation while undergoing catheterization.  This will also be placed in orders.  2.  Hypercholesterolemia: The patient continues on TriCor, and simvastatin 20 mg by mouth daily.  Will likely need to transition to high-dose statin especially if there is stenosis and stent placement during catheterization.  3.  Hypertension: Blood pressures currently well controlled on medication regimen.  He is on ACE inhibitor, lisinopril 20 mg daily.  Recent labs as above with a creatinine of 1.42.  Repeat labs are being completed prior to cath.  4.  History of OSA: Not interested in CPAP due to claustrophobia.  5.  Type 2 diabetes: Followed by PCP.  Metformin will be held post catheterization and morning of cath as he will be n.p.o.  Current medicines are reviewed at length with the patient today.    Labs/ tests ordered today include: Cardiac catheterization, cords with possible PCI.  [place] labs. Abnormal cardiac stress test [name] Young. [name] Young, ANP, AACC   12/19/2017 12:08 PM    [facility] [address] [place], [place] [address] Phone: [phone]; Fax: [phone]

## 2017-12-19 NOTE — Assessment & Plan Note (Signed)
Appreciate cards care - undergoing workup for possible ischemic cause.

## 2017-12-20 ENCOUNTER — Telehealth: Payer: Self-pay | Admitting: *Deleted

## 2017-12-20 LAB — CBC
Hematocrit: 39.9 % (ref 37.5–51.0)
Hemoglobin: 12.9 g/dL — ABNORMAL LOW (ref 13.0–17.7)
MCH: 29 pg (ref 26.6–33.0)
MCHC: 32.3 g/dL (ref 31.5–35.7)
MCV: 90 fL (ref 79–97)
Platelets: 224 10*3/uL (ref 150–379)
RBC: 4.45 x10E6/uL (ref 4.14–5.80)
RDW: 14.6 % (ref 12.3–15.4)
WBC: 7.3 10*3/uL (ref 3.4–10.8)

## 2017-12-20 LAB — BASIC METABOLIC PANEL
BUN / CREAT RATIO: 18 (ref 10–24)
BUN: 24 mg/dL (ref 8–27)
CHLORIDE: 104 mmol/L (ref 96–106)
CO2: 24 mmol/L (ref 20–29)
Calcium: 9.5 mg/dL (ref 8.6–10.2)
Creatinine, Ser: 1.34 mg/dL — ABNORMAL HIGH (ref 0.76–1.27)
GFR calc non Af Amer: 51 mL/min/{1.73_m2} — ABNORMAL LOW (ref >59)
GFR, EST AFRICAN AMERICAN: 59 mL/min/{1.73_m2} — AB (ref >59)
GLUCOSE: 87 mg/dL (ref 65–99)
POTASSIUM: 4.4 mmol/L (ref 3.5–5.2)
SODIUM: 143 mmol/L (ref 134–144)

## 2017-12-20 LAB — PROTIME-INR
INR: 1 (ref 0.8–1.2)
Prothrombin Time: 10.5 s (ref 9.1–12.0)

## 2017-12-20 NOTE — Telephone Encounter (Signed)
Pt contacted pre-catheterization scheduled at [facility] for: Friday December 21, 2017 7:30 AM Verified arrival time and place: [place] [facility] at: 5:30 AM Nothing to eat or drink after midnight prior to cath. Verified no known allergies.  Hold: Metformin 12/19/17, 12/20/17 and 48 hours post cath. Januvia AM of cath Furosemide AM of cath.  Except hold medications AM meds can be  taken pre-cath with sip of water including: ASA   Confirmed patient has responsible person to drive home post procedure and observe patient for 24 hours: yes  Pt encourage to increase PO water today pre-cath.

## 2017-12-21 ENCOUNTER — Other Ambulatory Visit: Payer: Self-pay | Admitting: *Deleted

## 2017-12-21 ENCOUNTER — Ambulatory Visit (HOSPITAL_COMMUNITY)
Admission: RE | Admit: 2017-12-21 | Discharge: 2017-12-21 | Disposition: A | Discharge status: Home / Self Care | Payer: Medicare Other | Source: Ambulatory Visit | Attending: Internal Medicine | Admitting: Internal Medicine

## 2017-12-21 ENCOUNTER — Encounter (HOSPITAL_COMMUNITY)
Admission: RE | Disposition: A | Discharge status: Home / Self Care | Payer: Self-pay | Source: Ambulatory Visit | Attending: Internal Medicine

## 2017-12-21 DIAGNOSIS — I129 Hypertensive chronic kidney disease with stage 1 through stage 4 chronic kidney disease, or unspecified chronic kidney disease: Secondary | ICD-10-CM | POA: Insufficient documentation

## 2017-12-21 DIAGNOSIS — E1122 Type 2 diabetes mellitus with diabetic chronic kidney disease: Secondary | ICD-10-CM | POA: Insufficient documentation

## 2017-12-21 DIAGNOSIS — I2582 Chronic total occlusion of coronary artery: Secondary | ICD-10-CM | POA: Diagnosis not present

## 2017-12-21 DIAGNOSIS — I493 Ventricular premature depolarization: Secondary | ICD-10-CM | POA: Insufficient documentation

## 2017-12-21 DIAGNOSIS — Z7982 Long term (current) use of aspirin: Secondary | ICD-10-CM | POA: Insufficient documentation

## 2017-12-21 DIAGNOSIS — R9439 Abnormal result of other cardiovascular function study: Secondary | ICD-10-CM | POA: Diagnosis present

## 2017-12-21 DIAGNOSIS — I2584 Coronary atherosclerosis due to calcified coronary lesion: Secondary | ICD-10-CM | POA: Diagnosis not present

## 2017-12-21 DIAGNOSIS — R7989 Other specified abnormal findings of blood chemistry: Secondary | ICD-10-CM

## 2017-12-21 DIAGNOSIS — Z87891 Personal history of nicotine dependence: Secondary | ICD-10-CM | POA: Diagnosis not present

## 2017-12-21 DIAGNOSIS — N183 Chronic kidney disease, stage 3 (moderate): Secondary | ICD-10-CM | POA: Insufficient documentation

## 2017-12-21 DIAGNOSIS — R0600 Dyspnea, unspecified: Secondary | ICD-10-CM | POA: Diagnosis present

## 2017-12-21 DIAGNOSIS — E7800 Pure hypercholesterolemia, unspecified: Secondary | ICD-10-CM | POA: Diagnosis not present

## 2017-12-21 DIAGNOSIS — K219 Gastro-esophageal reflux disease without esophagitis: Secondary | ICD-10-CM | POA: Diagnosis not present

## 2017-12-21 DIAGNOSIS — Z7984 Long term (current) use of oral hypoglycemic drugs: Secondary | ICD-10-CM | POA: Diagnosis not present

## 2017-12-21 DIAGNOSIS — I2510 Atherosclerotic heart disease of native coronary artery without angina pectoris: Secondary | ICD-10-CM | POA: Diagnosis not present

## 2017-12-21 DIAGNOSIS — R0602 Shortness of breath: Secondary | ICD-10-CM | POA: Diagnosis present

## 2017-12-21 HISTORY — PX: LEFT HEART CATH AND CORONARY ANGIOGRAPHY: CATH118249

## 2017-12-21 LAB — GLUCOSE, CAPILLARY
Glucose-Capillary: 119 mg/dL — ABNORMAL HIGH (ref 65–99)
Glucose-Capillary: 134 mg/dL — ABNORMAL HIGH (ref 65–99)

## 2017-12-21 SURGERY — LEFT HEART CATH AND CORONARY ANGIOGRAPHY
Anesthesia: LOCAL

## 2017-12-21 MED ORDER — HEPARIN SODIUM (PORCINE) 1000 UNIT/ML IJ SOLN
INTRAMUSCULAR | Status: DC | PRN
  Administered 2017-12-21: 5000 [IU] via INTRAVENOUS

## 2017-12-21 MED ORDER — FENTANYL CITRATE (PF) 100 MCG/2ML IJ SOLN
INTRAMUSCULAR | Status: AC
Start: 2017-12-21 — End: ?
  Filled 2017-12-21: qty 2

## 2017-12-21 MED ORDER — IOHEXOL 350 MG/ML SOLN
INTRAVENOUS | Status: DC | PRN
  Administered 2017-12-21: 35 mL via INTRA_ARTERIAL

## 2017-12-21 MED ORDER — SODIUM CHLORIDE 0.9 % IV SOLN: 250.0000 mL | INTRAVENOUS | Status: DC | PRN

## 2017-12-21 MED ORDER — ASPIRIN 81 MG PO CHEW: 81.0000 mg | CHEWABLE_TABLET | ORAL | Status: AC

## 2017-12-21 MED ORDER — METFORMIN HCL 500 MG PO TABS: 500.0000 mg | ORAL_TABLET | Freq: Two times a day (BID) | ORAL | 3 refills | Status: DC

## 2017-12-21 MED ORDER — HEPARIN (PORCINE) IN NACL 2-0.9 UNIT/ML-% IJ SOLN
INTRAMUSCULAR | Status: AC
  Filled 2017-12-21: qty 1000

## 2017-12-21 MED ORDER — MIDAZOLAM HCL 2 MG/2ML IJ SOLN
INTRAMUSCULAR | Status: DC | PRN
  Administered 2017-12-21: 1 mg via INTRAVENOUS

## 2017-12-21 MED ORDER — HEPARIN SODIUM (PORCINE) 1000 UNIT/ML IJ SOLN
INTRAMUSCULAR | Status: AC
  Filled 2017-12-21: qty 1

## 2017-12-21 MED ORDER — SODIUM CHLORIDE 0.9 % WEIGHT BASED INFUSION
3.0000 mL/kg/h | INTRAVENOUS | Status: DC
  Administered 2017-12-21: 3 mL/kg/h via INTRAVENOUS

## 2017-12-21 MED ORDER — SODIUM CHLORIDE 0.9% FLUSH: 3.0000 mL | INTRAVENOUS | Status: DC | PRN

## 2017-12-21 MED ORDER — ASPIRIN EC 81 MG PO TBEC: 81.0000 mg | DELAYED_RELEASE_TABLET | Freq: Every day | ORAL | Status: DC

## 2017-12-21 MED ORDER — ONDANSETRON HCL 4 MG/2ML IJ SOLN: 4.0000 mg | Freq: Four times a day (QID) | INTRAMUSCULAR | Status: DC | PRN

## 2017-12-21 MED ORDER — VERAPAMIL HCL 2.5 MG/ML IV SOLN
INTRAVENOUS | Status: DC | PRN
  Administered 2017-12-21: 10 mL via INTRA_ARTERIAL

## 2017-12-21 MED ORDER — ACETAMINOPHEN 325 MG PO TABS: 650.0000 mg | ORAL_TABLET | ORAL | Status: DC | PRN

## 2017-12-21 MED ORDER — VERAPAMIL HCL 2.5 MG/ML IV SOLN
INTRAVENOUS | Status: AC
  Filled 2017-12-21: qty 2

## 2017-12-21 MED ORDER — SODIUM CHLORIDE 0.9% FLUSH: 3.0000 mL | Freq: Two times a day (BID) | INTRAVENOUS | Status: DC

## 2017-12-21 MED ORDER — NITROGLYCERIN 0.4 MG SL SUBL: 0.4000 mg | SUBLINGUAL_TABLET | SUBLINGUAL | 99 refills | Status: DC | PRN

## 2017-12-21 MED ORDER — FENTANYL CITRATE (PF) 100 MCG/2ML IJ SOLN
INTRAMUSCULAR | Status: DC | PRN
  Administered 2017-12-21: 50 ug via INTRAVENOUS

## 2017-12-21 MED ORDER — MIDAZOLAM HCL 2 MG/2ML IJ SOLN
INTRAMUSCULAR | Status: AC
  Filled 2017-12-21: qty 2

## 2017-12-21 MED ORDER — LIDOCAINE HCL (PF) 1 % IJ SOLN
INTRAMUSCULAR | Status: AC
  Filled 2017-12-21: qty 30

## 2017-12-21 MED ORDER — CARVEDILOL 3.125 MG PO TABS: 3.1250 mg | ORAL_TABLET | Freq: Two times a day (BID) | ORAL | 11 refills | Status: DC

## 2017-12-21 MED ORDER — SODIUM CHLORIDE 0.9 % WEIGHT BASED INFUSION: 1.0000 mL/kg/h | INTRAVENOUS | Status: DC

## 2017-12-21 MED ORDER — LIDOCAINE HCL (PF) 1 % IJ SOLN
INTRAMUSCULAR | Status: DC | PRN
  Administered 2017-12-21: 2 mL

## 2017-12-21 MED ORDER — HEPARIN (PORCINE) IN NACL 2-0.9 UNIT/ML-% IJ SOLN
INTRAMUSCULAR | Status: AC | PRN
  Administered 2017-12-21 (×2): 500 mL

## 2017-12-21 MED ORDER — SODIUM CHLORIDE 0.9 % IV SOLN: INTRAVENOUS | Status: DC

## 2017-12-21 SURGICAL SUPPLY — 10 items
BAND ZEPHYR COMPRESS 30 LONG (HEMOSTASIS) ×2 IMPLANT
CATH IMPULSE 5F ANG/FL3.5 (CATHETERS) ×2 IMPLANT
GUIDEWIRE INQWIRE 1.5J.035X260 (WIRE) ×1 IMPLANT
INQWIRE 1.5J .035X260CM (WIRE) ×2
KIT HEART LEFT (KITS) ×2 IMPLANT
NEEDLE PERC 21GX4CM (NEEDLE) ×2 IMPLANT
PACK CARDIAC CATHETERIZATION (CUSTOM PROCEDURE TRAY) ×2 IMPLANT
SHEATH RAIN RADIAL 21G 6FR (SHEATH) ×2 IMPLANT
TRANSDUCER W/STOPCOCK (MISCELLANEOUS) ×2 IMPLANT
TUBING CIL FLEX 10 FLL-RA (TUBING) ×2 IMPLANT

## 2017-12-21 NOTE — Discharge Instructions (Signed)
Drink plenty of fluids over next 48 hours and keep right wrist at heart level for 24 hours  Radial Site Care Refer to this sheet in the next few weeks. These instructions provide you with information about caring for yourself after your procedure. Your health care provider may also give you more specific instructions. Your treatment has been planned according to current medical practices, but problems sometimes occur. Call your health care provider if you have any problems or questions after your procedure. What can I expect after the procedure? After your procedure, it is typical to have the following:  Bruising at the radial site that usually fades within 1-2 weeks.  Blood collecting in the tissue (hematoma) that may be painful to the touch. It should usually decrease in size and tenderness within 1-2 weeks.  Follow these instructions at home:  Take medicines only as directed by your health care provider.  You may shower 24-48 hours after the procedure or as directed by your health care provider. Remove the bandage (dressing) and gently wash the site with plain soap and water. Pat the area dry with a clean towel. Do not rub the site, because this may cause bleeding.  Do not take baths, swim, or use a hot tub until your health care provider approves.  Check your insertion site every day for redness, swelling, or drainage.  Do not apply powder or lotion to the site.  Do not flex or bend the affected arm for 24 hours or as directed by your health care provider.  Do not push or pull heavy objects with the affected arm for 24 hours or as directed by your health care provider.  Do not lift over 10 lb (4.5 kg) for 5 days after your procedure or as directed by your health care provider.  Ask your health care provider when it is okay to: ? Return to work or school. ? Resume usual physical activities or sports. ? Resume sexual activity.  Do not drive home if you are discharged the same day  as the procedure. Have someone else drive you.  You may drive 24 hours after the procedure unless otherwise instructed by your health care provider.  Do not operate machinery or power tools for 24 hours after the procedure.  If your procedure was done as an outpatient procedure, which means that you went home the same day as your procedure, a responsible adult should be with you for the first 24 hours after you arrive home.  Keep all follow-up visits as directed by your health care provider. This is important. Contact a health care provider if:  You have a fever.  You have chills.  You have increased bleeding from the radial site. Hold pressure on the site. Get help right away if:  You have unusual pain at the radial site.  You have redness, warmth, or swelling at the radial site.  You have drainage (other than a small amount of blood on the dressing) from the radial site.  The radial site is bleeding, and the bleeding does not stop after 30 minutes of holding steady pressure on the site.  Your arm or hand becomes pale, cool, tingly, or numb. This information is not intended to replace advice given to you by your health care provider. Make sure you discuss any questions you have with your health care provider. Document Released: 09/30/2010 Document Revised: 02/03/2016 Document Reviewed: 03/16/2014 Elsevier Interactive Patient Education  2018 [employer].

## 2017-12-21 NOTE — Interval H&P Note (Signed)
History and Physical Interval Note:  12/21/2017 7:06 AM  [name] Young.  has presented today for cardiac catheterization, with the diagnosis of shortness of breath and abnormal stress test.  The various methods of treatment have been discussed with the patient and family. After consideration of risks, benefits and other options for treatment, the patient has consented to  Procedure(s): LEFT HEART CATH AND CORONARY ANGIOGRAPHY (N/A) as a surgical intervention .  The patient's history has been reviewed, patient examined, no change in status, stable for surgery.  I have reviewed the patient's chart and labs.  Questions were answered to the patient's satisfaction.    Cath Lab Visit (complete for each Cath Lab visit)  Clinical Evaluation Leading to the Procedure:   ACS: No.  Non-ACS:    Anginal Classification: CCS IV  Anti-ischemic medical therapy: No Therapy  Non-Invasive Test Results: Intermediate-risk stress test findings: cardiac mortality 1-3%/year  Prior CABG: No previous CABG  [name]

## 2017-12-21 NOTE — Brief Op Note (Signed)
BRIEF CARDIAC CATHETERIZATION NOTE  DATE: 12/21/2017 TIME: 8:15 AM  PATIENT:  [name] Young.  77 y.o. male  PRE-OPERATIVE DIAGNOSIS:  as  POST-OPERATIVE DIAGNOSIS:  * No post-op diagnosis entered *  PROCEDURE:  Procedure(s): LEFT HEART CATH AND CORONARY ANGIOGRAPHY (N/A)  SURGEON:  Surgeon(s) and Role:    * Nelva Bush, MD - Primary  FINDINGS: 1. Significant 3-vessel CAD, including 70-80% mid LAD stenosis, 80% ostial and mid diagonal lesions, large OM1 with 99% mid vessel stenosis, and CTO of proximal RCA with left-to-right collaterals. 2. Upper normal to mildly elevated LVEDP.  RECOMMENDATIONS: 1. Add carvedilol 3.125 mg BID. 2. Outpatient cardiac surgery consultation for CABG.  Nelva Bush, MD [facility] Pager: [phone]

## 2017-12-21 NOTE — Research (Signed)
CADFEM Informed Consent   Subject Name: [name] Young.  Subject met inclusion and exclusion criteria.  The informed consent form, study requirements and expectations were reviewed with the subject and questions and concerns were addressed prior to the signing of the consent form.  The subject verbalized understanding of the trail requirements.  The subject agreed to participate in the CADFEM trial and signed the informed consent.  The informed consent was obtained prior to performance of any protocol-specific procedures for the subject.  A copy of the signed informed consent was given to the subject and a copy was placed in the subject's medical record.  Christena Flake 12/21/2017, 06:50 AM

## 2017-12-24 ENCOUNTER — Telehealth: Payer: Self-pay

## 2017-12-24 ENCOUNTER — Other Ambulatory Visit: Payer: Medicare Other | Admitting: *Deleted

## 2017-12-24 ENCOUNTER — Encounter (HOSPITAL_COMMUNITY): Payer: Self-pay | Admitting: Internal Medicine

## 2017-12-24 DIAGNOSIS — R7989 Other specified abnormal findings of blood chemistry: Secondary | ICD-10-CM

## 2017-12-24 DIAGNOSIS — Z79899 Other long term (current) drug therapy: Secondary | ICD-10-CM

## 2017-12-24 LAB — BASIC METABOLIC PANEL
BUN/Creatinine Ratio: 11 (ref 10–24)
BUN: 14 mg/dL (ref 8–27)
CHLORIDE: 101 mmol/L (ref 96–106)
CO2: 24 mmol/L (ref 20–29)
Calcium: 10.9 mg/dL — ABNORMAL HIGH (ref 8.6–10.2)
Creatinine, Ser: 1.31 mg/dL — ABNORMAL HIGH (ref 0.76–1.27)
GFR calc non Af Amer: 53 mL/min/{1.73_m2} — ABNORMAL LOW (ref >59)
GFR, EST AFRICAN AMERICAN: 61 mL/min/{1.73_m2} (ref >59)
GLUCOSE: 134 mg/dL — AB (ref 65–99)
Potassium: 4.1 mmol/L (ref 3.5–5.2)
SODIUM: 140 mmol/L (ref 134–144)

## 2017-12-24 NOTE — Telephone Encounter (Signed)
Called Patient .  Left detailed message for patient (DPR)

## 2017-12-24 NOTE — Telephone Encounter (Signed)
-----   Message from Lendon Colonel, NP sent at 12/22/2017 11:07 AM EDT ----- Regarding: FW: Post cath f/u   ----- Message ----- From: Nelva Bush, MD Sent: 12/21/2017   9:03 AM To: Minus Breeding, MD, Lendon Colonel, NP, # Subject: Post cath f/u                                  Good morning Anderson Malta,  I cathed MR. [place] this morning and would like for him to have a BMP on Monday (4/15) for f/u of his renal function before restarting metformin.  Can you also add him onto my schedule at 7:40 AM next Friday (4/19); his wife is scheduled to see me that morning at 8 AM?  Thanks.  Chris End

## 2017-12-25 MED FILL — Heparin Sodium (Porcine) 2 Unit/ML in Sodium Chloride 0.9%: INTRAMUSCULAR | Qty: 1000 | Status: AC

## 2017-12-25 NOTE — Telephone Encounter (Signed)
Follow up    Patient wife [name] Young is returning call. Please call to discuss.

## 2017-12-25 NOTE — Telephone Encounter (Signed)
Patient has appt with Dr. Saunders Revel on 4/19 as requested

## 2017-12-28 ENCOUNTER — Encounter: Payer: Self-pay | Admitting: Internal Medicine

## 2017-12-28 ENCOUNTER — Ambulatory Visit: Payer: Medicare Other | Admitting: Internal Medicine

## 2017-12-28 VITALS — BP 144/56 | HR 86 | Ht 70.5 in | Wt 280.0 lb

## 2017-12-28 DIAGNOSIS — E785 Hyperlipidemia, unspecified: Secondary | ICD-10-CM

## 2017-12-28 DIAGNOSIS — E1159 Type 2 diabetes mellitus with other circulatory complications: Secondary | ICD-10-CM

## 2017-12-28 DIAGNOSIS — N183 Chronic kidney disease, stage 3 unspecified: Secondary | ICD-10-CM

## 2017-12-28 DIAGNOSIS — I10 Essential (primary) hypertension: Secondary | ICD-10-CM

## 2017-12-28 DIAGNOSIS — I25119 Atherosclerotic heart disease of native coronary artery with unspecified angina pectoris: Secondary | ICD-10-CM | POA: Diagnosis not present

## 2017-12-28 DIAGNOSIS — I5032 Chronic diastolic (congestive) heart failure: Secondary | ICD-10-CM | POA: Diagnosis not present

## 2017-12-28 MED ORDER — CARVEDILOL 6.25 MG PO TABS: 6.2500 mg | ORAL_TABLET | Freq: Two times a day (BID) | ORAL | 3 refills | Status: DC

## 2017-12-28 MED ORDER — FUROSEMIDE 40 MG PO TABS: 40.0000 mg | ORAL_TABLET | Freq: Every day | ORAL | 3 refills | Status: DC

## 2017-12-28 NOTE — Patient Instructions (Addendum)
Medication Instructions:  INCREASE LASIX to 40 mg daily INCREASE CARVEDILOL to 6.125 twice per day  -- If you need a refill on your cardiac medications before your next appointment, please call your pharmacy. --  Labwork: BMET 1-2 WEEKS at Dr. Ria Bush office [facility])  Testing/Procedures: None ordered  Follow-Up: Your physician wants you to follow-up in: 2 months with Dr. Saunders Revel.    Thank you for choosing [facility]!!    Any Other Special Instructions Will Be Listed Below (If Applicable).  [place] DR. BARTLE OR VANTRIGHT

## 2017-12-28 NOTE — Progress Notes (Signed)
Follow-up Outpatient Visit Date: 12/28/2017  Primary Care Provider: Ria Bush, MD [address] [place] [address]  Chief Complaint: Shortness of breath  HPI:  [name] Young is a 77 y.o. year-old male with history of recently diagnosed three-vessel coronary artery disease by catheterization in the setting of progressive dyspnea on exertion, hypertension, hyperlipidemia, type 2 diabetes mellitus, and chronic kidney disease stage III who presents for follow-up of coronary artery disease.  I met [name] Young a week ago at the time of his catheterization.  He was previously seen by Jory Sims, NP, and Dr. Percival Spanish at our [facility] office.  Due to worsening shortness of breath and intermediate risk stress test, he was referred for coronary angiography.  This showed three-vessel CAD (see details below).  Given lack of chest pain, we agreed to try medical therapy and have him follow-up with me today.  He was started on carvedilol 3.125 mg twice daily following his catheterization, which he has tolerated well.  Unfortunately, [name] Young reports that his exertional dyspnea has worsened further over the last week.  He is now only able to walk about 40-50 yards before needing to stop and catch his breath.  He denies chest pain, palpitations, lightheadedness, and orthopnea.  He has continued leg edema, unchanged, despite continuing to take furosemide 20 mg daily.  His right radial arteriotomy site is slightly tender but overall has healed up well.  --------------------------------------------------------------------------------------------------  Cardiovascular History & Procedures: Cardiovascular Problems:  Multivessel coronary artery disease  Risk Factors:  Hypertension, hyperlipidemia, type 2 diabetes mellitus, obesity, male gender, and age greater than 47  Cath/PCI:  LHC (12/21/17): LMCA normal.  LAD with sequential 20% proximal and 70-80% mid LAD stenoses.  Mid LAD lesion  involves moderate-caliber D1 branch, with 70% ostial and 90% mid stenoses Ladona Mow 0,1,1).  Large LCx with small OM1 with 80% stenosis and moderate OM 2 with subtotal chronic occlusion and bridging collaterals, and chronic total occlusion of the proximal RCA with left-to-right collaterals filling the RPDA.  CV Surgery:  None  EP Procedures and Devices:  None  Non-Invasive Evaluation(s):  Exercise MPI (12/12/17): Intermediate risk study with moderate in size, moderate in severity basal and mid inferolateral reversible defect consistent with ischemia.  LVEF mildly reduced at 51% with inferolateral hypokinesis.  Poor exercise tolerance with frequent PVCs noted during stress.  TTE (11/23/17): Normal LV size with mild LVH.  LVEF 50-55% with normal wall motion and grade 1 diastolic dysfunction.  Mildly thickened and calcified mitral valve consistent with rheumatic disease.  No evidence of stenosis.  Trivial regurgitation.  Normal RV size and function.  Normal PA pressure.  Recent CV Pertinent Labs: Lab Results  Component Value Date   CHOL 117 12/10/2017   CHOL 178 02/01/2010   HDL 26.40 (L) 12/10/2017   LDLCALC 59 12/10/2017   LDLDIRECT 67.0 06/11/2017   TRIG 159.0 (H) 12/10/2017   TRIG 812 02/01/2010   CHOLHDL 4 12/10/2017   INR 1.0 12/19/2017   K 4.1 12/24/2017   K 4.5 02/01/2010   BUN 14 12/24/2017   CREATININE 1.31 (H) 12/24/2017   CREATININE 0.95 02/01/2010    Past medical and surgical history were reviewed and updated in EPIC.  Current Meds  Medication Sig  . aspirin EC 81 MG tablet Take 1 tablet (81 mg total) by mouth daily.  . Cholecalciferol (VITAMIN D) 2000 UNITS CAPS Take 1 capsule by mouth daily.  Marland Kitchen doxazosin (CARDURA) 2 MG tablet Take 1 tablet (2 mg total)  by mouth daily.  . fenofibrate (TRICOR) 145 MG tablet TAKE 1 TABLET BY MOUTH ONCE DAILY  . Glucosamine-Chondroitin (OSTEO BI-FLEX REGULAR STRENGTH PO) Take 2 tablets by mouth daily.  Marland Kitchen lisinopril (PRINIVIL,ZESTRIL) 20  MG tablet TAKE 1 TABLET BY MOUTH DAILY  . metFORMIN (GLUCOPHAGE) 500 MG tablet Take 1 tablet (500 mg total) by mouth 2 (two) times daily with a meal.  . Multiple Vitamins-Minerals (MACULAR VITAMIN BENEFIT PO) Take 1 tablet by mouth daily.  . nitroGLYCERIN (NITROSTAT) 0.4 MG SL tablet Place 1 tablet (0.4 mg total) under the tongue every 5 (five) minutes as needed for chest pain.  . Omega-3 Fatty Acids (FISH OIL) 1200 MG CAPS Take 1 capsule by mouth 3 (three) times daily.  Marland Kitchen omeprazole (PRILOSEC) 40 MG capsule Take 1 capsule (40 mg total) by mouth daily.  . simvastatin (ZOCOR) 20 MG tablet TAKE ONE TABLET BY MOUTH EVERY NIGHT AT BEDTIME  . sitaGLIPtin (JANUVIA) 50 MG tablet Take 1 tablet (50 mg total) by mouth daily.  . [DISCONTINUED] carvedilol (COREG) 3.125 MG tablet Take 1 tablet (3.125 mg total) by mouth 2 (two) times daily.  . [DISCONTINUED] furosemide (LASIX) 20 MG tablet Take 1 tablet (20 mg total) by mouth daily.    Allergies: Patient has no known allergies.  Social History   Tobacco Use  . Smoking status: Never Smoker  . Smokeless tobacco: Former Systems developer    Types: Chew  Substance Use Topics  . Alcohol use: No  . Drug use: No    Family History  Problem Relation Age of Onset  . Alzheimer's disease Mother   . Breast cancer Mother        breast  . Atrial fibrillation Mother   . CAD Mother   . Stroke Father 62  . Diabetes Father   . CAD Father   . Rectal cancer Maternal Grandfather        rectal  . Colon cancer Maternal Grandfather 31  . Esophageal cancer Neg Hx   . Stomach cancer Neg Hx     Review of Systems: A 12-system review of systems was performed and was negative except as noted in the HPI.  --------------------------------------------------------------------------------------------------  Physical Exam: BP (!) 144/56   Pulse 86   Ht 5' 10.5" (1.791 m)   Wt 280 lb (127 kg)   BMI 39.61 kg/m   General: NAD.  He is accompanied by his wife and  daughter. HEENT: No conjunctival pallor or scleral icterus. Moist mucous membranes.  OP clear. Neck: Supple without lymphadenopathy, thyromegaly, JVD, or HJR. Lungs: Normal work of breathing. Clear to auscultation bilaterally without wheezes or crackles. Heart: Regular rate and rhythm occasional extrasystoles.  No murmurs or rubs.  Unable to assess PMI due to body habitus. Abd: Bowel sounds present. Soft, NT/ND.  Unable to assess HSM due to body habitus. Ext: Tibial edema.  Right radial arteriotomy site is well-healed with mild bruising.  2+ right radial pulse. Skin: Warm and dry without rash.  EKG: Normal sinus rhythm with frequent PVCs and nonspecific ST/T changes.  Lab Results  Component Value Date   WBC 7.3 12/19/2017   HGB 12.9 (L) 12/19/2017   HCT 39.9 12/19/2017   MCV 90 12/19/2017   PLT 224 12/19/2017    Lab Results  Component Value Date   NA 140 12/24/2017   K 4.1 12/24/2017   CL 101 12/24/2017   CO2 24 12/24/2017   BUN 14 12/24/2017   CREATININE 1.31 (H) 12/24/2017   GLUCOSE 134 (H)  12/24/2017   ALT 18 11/05/2017    Lab Results  Component Value Date   CHOL 117 12/10/2017   HDL 26.40 (L) 12/10/2017   LDLCALC 59 12/10/2017   LDLDIRECT 67.0 06/11/2017   TRIG 159.0 (H) 12/10/2017   CHOLHDL 4 12/10/2017    --------------------------------------------------------------------------------------------------  ASSESSMENT AND PLAN: Coronary artery disease with atypical angina I am worried that [name] Young worsening shortness of breath is his anginal equivalent.  Certainly with his history of diabetes, he may not experience typical chest pain.  Though his myocardial perfusion stress test was interpreted as low risk, the fact that [name] Young was not able to complete stage I and had significant ventricular ectopy during the stress test in the setting of three-vessel CAD by catheterization, I feel that he is at high risk for future cardiovascular events.  Despite addition of  low-dose carvedilol, [name] Young has not felt any better.  If anything his exertional dyspnea has worsened.  We have discussed further medical therapy, PCI, and referral for CABG.  We personally reviewed his angiograms during today's visit.  His Syntax score is 32 (intermediate to high complexity).  He would like to meet with cardiac surgery to discuss bypass.  If he is not a good surgical candidate, I would favor PCI to the LAD +/- diagonal branch with medical management chronic subtotal/total occlusions of OM2 and RCA.  In the meantime, we have agreed to increase carvedilol to 6.25 mg twice daily.  We will continue with aspirin and simvastatin, given most recent LDL of 59 earlier this month.  Chronic diastolic heart failure [name] Young still appears volume overloaded with lower extremity edema on exam.  LVEDP was mildly elevated at the time of his catheterization.  We will increase furosemide to 40 mg daily with repeat BMP in 1-2 weeks (he wishes to have this performed through his PCP, Dr. Danise Mina).  We will also increase carvedilol to 6.25 mg twice daily.  Hyperlipidemia LDL at goal on simvastatin 20 mg daily.  Defer changes at this time, though high intensity statin may need to be considered in the future.  We will also continue with fenofibrate fish oil for history of hypertriglyceridemia.  Hypertension Blood pressure suboptimally controlled (goal less than 130/80).  We will increase carvedilol to 6.25 mg twice daily.  To new the remainder of his medications for now.  Chronic kidney disease Creatinine was stable on recheck following catheterization.  We will need to repeat a BMP in 1-2 weeks given escalation of furosemide.  Type 2 diabetes mellitus Continue metformin and sitagliptin.  Morbid obesity BMI greater than 35 with multiple comorbidities including coronary artery disease, hypertension, chronic kidney disease, and diabetes mellitus.  Weight loss encouraged.  However, I have  recommended that Mr. Bodey limits his activities until we have been able to proceed with medical optimization and coronary revascularization.  Follow-up: Return to clinic in 2 months.  Nelva Bush, MD 12/29/2017 10:52 AM

## 2017-12-29 ENCOUNTER — Encounter: Payer: Self-pay | Admitting: Internal Medicine

## 2017-12-29 DIAGNOSIS — I5032 Chronic diastolic (congestive) heart failure: Secondary | ICD-10-CM

## 2017-12-29 DIAGNOSIS — I2510 Atherosclerotic heart disease of native coronary artery without angina pectoris: Secondary | ICD-10-CM | POA: Insufficient documentation

## 2017-12-31 ENCOUNTER — Telehealth: Payer: Self-pay | Admitting: Family Medicine

## 2017-12-31 ENCOUNTER — Ambulatory Visit: Payer: Medicare Other | Admitting: Cardiology

## 2017-12-31 DIAGNOSIS — I5032 Chronic diastolic (congestive) heart failure: Secondary | ICD-10-CM

## 2017-12-31 NOTE — Telephone Encounter (Signed)
Copied from [place] [id number]. Topic: Quick Communication - See Telephone Encounter >> Dec 31, 2017  9:48 AM Cleaster Corin, NT wrote: CRM for notification. See Telephone encounter for: 12/31/17.  Lab orders needed from Cardiologist BMET schedule for May 31st

## 2018-01-02 NOTE — Telephone Encounter (Signed)
Ordered.  plz schedule lab visit at our office.

## 2018-01-02 NOTE — Telephone Encounter (Signed)
Left message on vm per dpr for pt to call back and schedule lab visit before seeing cardiologist on 02/08/18.

## 2018-01-03 NOTE — Telephone Encounter (Addendum)
Left message on vm per dpr asking pt to call back and schedule a lab visit before his appt with cardiology on 02/08/18.

## 2018-01-04 NOTE — Telephone Encounter (Signed)
Mailed a letter to pt about scheduling a lab visit.

## 2018-01-08 ENCOUNTER — Institutional Professional Consult (permissible substitution): Payer: Medicare Other | Admitting: Cardiothoracic Surgery

## 2018-01-08 ENCOUNTER — Encounter: Payer: Self-pay | Admitting: Cardiothoracic Surgery

## 2018-01-08 VITALS — BP 124/67 | HR 64 | Resp 20 | Ht 70.5 in | Wt 279.0 lb

## 2018-01-08 DIAGNOSIS — I2510 Atherosclerotic heart disease of native coronary artery without angina pectoris: Secondary | ICD-10-CM

## 2018-01-08 NOTE — Patient Instructions (Signed)
Heart bypass surgery at [facility] a.m. May 10 My scheduling nurse Levonne Spiller will call you to schedule the preop visit Stop taking fish oil tablets now Stop taking metformin before surgery, last dose will be p.m. May 7 On a.m. of surgery no breakfast, take only carvedilol with sip of water before leaving house

## 2018-01-08 NOTE — Progress Notes (Signed)
PCP is Ria Bush, MD Referring Provider is End, Harrell Gave, MD  Chief Complaint  Patient presents with  . Coronary Artery Disease    Surgical eval,Cardiac Cath 12/21/17, ECHO 11/23/17  Patient examined, coronary angiograms and echocardiogram images personally reviewed and counseled with patient HPI: 77 year old morbidly obese diabetic Young with symptoms of progressive shortness of breath and exercise intolerance.  Stress test performed after cardiology consult showed inferior lateral scar.  Ejection fraction approximately 45-[name]%.  Cardiac cath by Dr. END demonstrated moderate to severe diabetic disease with suboptimal targets, occlusion of the RCA and diffuse distal disease of the LAD and circumflex systems.  LVEDP 15.  Echocardiogram shows EF 45% with a calcified anterior leaflet but without significant mitral stenosis or regurgitation. Patient has frequent ectopy but basic rhythm is sinus  Patient still remains active around the home and yard and enjoys hunting with his family.  He does not use a walker or a scooter when he goes to [place] for his hunting supplies.  His last hospitalization was for kidney stones which required lithotripsy over 5 years ago.  No hospitalizations for heart failure.  He has difficulty with claustrophobia and could not tolerate a sleep study and he cannot close the shower door in the bathroom. Past Medical History:  Diagnosis Date  . Chronic kidney disease    kidney stones  . GERD (gastroesophageal reflux disease)   . History of cholelithiasis   . History of kidney stones    ca ox [name] @ [facility]) now [place]  . History of pneumonia   . HLD (hyperlipidemia)   . HTN (hypertension)   . T2DM (type 2 diabetes mellitus) ([place]) 2010    Past Surgical History:  Procedure Laterality Date  . CARPAL TUNNEL RELEASE Bilateral   . CATARACT EXTRACTION, BILATERAL    . COLONOSCOPY  11/2012   12 adenomatous polyps, diverticulosis, rec rpt 1 yr Ardis Hughs)   . COLONOSCOPY  12/2013   3 polyps, diverticulosis, rec rpt 3 yrs Ardis Hughs)  . KNEE CARTILAGE SURGERY Left   . LEFT HEART CATH AND CORONARY ANGIOGRAPHY N/A 12/21/2017   Procedure: LEFT HEART CATH AND CORONARY ANGIOGRAPHY;  Surgeon: Nelva Bush, MD;  Location: [facility];  Service: Cardiovascular;  Laterality: N/A;  . LITHOTRIPSY    . UMBILICAL HERNIA REPAIR     with mesh    Family History  Problem Relation Age of Onset  . Alzheimer's disease Mother   . Breast cancer Mother        breast  . Atrial fibrillation Mother   . CAD Mother   . Stroke Father 22  . Diabetes Father   . CAD Father   . Rectal cancer Maternal Grandfather        rectal  . Colon cancer Maternal Grandfather 33  . Esophageal cancer Neg Hx   . Stomach cancer Neg Hx     Social History Social History   Tobacco Use  . Smoking status: Never Smoker  . Smokeless tobacco: Former Systems developer    Types: Chew  Substance Use Topics  . Alcohol use: No  . Drug use: No    Current Outpatient Medications  Medication Sig Dispense Refill  . aspirin EC 81 MG tablet Take 1 tablet (81 mg total) by mouth daily.    . carvedilol (COREG) 6.25 MG tablet Take 1 tablet (6.25 mg total) by mouth 2 (two) times daily. 180 tablet 3  . Cholecalciferol (VITAMIN D) 2000 UNITS CAPS Take 1 capsule by mouth daily.    Marland Kitchen  doxazosin (CARDURA) 2 MG tablet Take 1 tablet (2 mg total) by mouth daily. 30 tablet 3  . fenofibrate (TRICOR) 145 MG tablet TAKE 1 TABLET BY MOUTH ONCE DAILY 30 tablet 4  . furosemide (LASIX) 40 MG tablet Take 1 tablet (40 mg total) by mouth daily. 90 tablet 3  . lisinopril (PRINIVIL,ZESTRIL) 20 MG tablet TAKE 1 TABLET BY MOUTH DAILY 90 tablet 3  . metFORMIN (GLUCOPHAGE) 500 MG tablet Take 1 tablet (500 mg total) by mouth 2 (two) times daily with a meal. 180 tablet 3  . Multiple Vitamins-Minerals (MACULAR VITAMIN BENEFIT PO) Take 1 tablet by mouth daily.    . nitroGLYCERIN (NITROSTAT) 0.4 MG SL tablet Place 1 tablet (0.4  mg total) under the tongue every 5 (five) minutes as needed for chest pain. 30 tablet prn  . Omega-3 Fatty Acids (FISH OIL) 1200 MG CAPS Take 1 capsule by mouth 3 (three) times daily.    Marland Kitchen omeprazole (PRILOSEC) 40 MG capsule Take 1 capsule (40 mg total) by mouth daily. 30 capsule 11  . simvastatin (ZOCOR) 20 MG tablet TAKE ONE TABLET BY MOUTH EVERY NIGHT AT BEDTIME 90 tablet 3  . sitaGLIPtin (JANUVIA) [name] MG tablet Take 1 tablet ([name] mg total) by mouth daily. 30 tablet 11   No current facility-administered medications for this visit.     No Known Allergies  Review of Systems   The patients record indicates he is DO NOT RESUSCITATE DO NOT INTUBATE but he agrees to rescind this policy for an indefinite time after the surgery  Patient is right-hand dominant No previous thoracic trauma or pneumothorax Patient just had his teeth cleaned by his dentist 6 weeks ago no dental complaints or swallowing difficulties Patient has history of jaundice at age 21 probable hepatitis A No history of smoking or alcohol use No history of DVT or varicose veins Weight is been stable last 3 months No bleeding diathesis  BP 124/67   Pulse 64   Resp 20   Ht 5' 10.5" (1.791 m)   Wt 279 lb (126.6 kg)   SpO2 96% Comment: RA  BMI 39.47 kg/m  Physical Exam      Exam    General- alert and comfortable, obese but very verbose 77 year old man    Neck- no JVD, no cervical adenopathy palpable, no carotid bruit   Lungs- clear without rales, wheezes   Cor- regular rate and rhythm, no murmur , gallop   Abdomen- soft, non-tender   Extremities - warm, non-tender, minimal edema   Neuro- oriented, appropriate, no focal weakness   Diagnostic Tests: Coronary angiogram showed three-vessel coronary disease in a diabetic pattern Mild-moderate LV dysfunction PFTs pending CT scan of chest pending to assess for pulmonary hypertension  Impression: Class III symptoms of angina-coronary ischemia associated with  documented three-vessel CAD and mild-moderate LV dysfunction.  Plan: Patient will undergo preoperative evaluation and to be scheduled for multivessel bypass surgery at [facility] on May 10.  I discussed the indications expected benefits as well as the risks of CABG-stroke, bleeding, postop pulmonary problems including prolonged ventilation requirement for tracheostomy, postoperative organ failure, postop infection, and death.  He agrees to proceed with surgery.  Len Childs, MD [facility] [phone]

## 2018-01-09 ENCOUNTER — Other Ambulatory Visit: Payer: Self-pay | Admitting: *Deleted

## 2018-01-09 DIAGNOSIS — I2510 Atherosclerotic heart disease of native coronary artery without angina pectoris: Secondary | ICD-10-CM

## 2018-01-09 DIAGNOSIS — I272 Pulmonary hypertension, unspecified: Secondary | ICD-10-CM

## 2018-01-09 DIAGNOSIS — R0602 Shortness of breath: Secondary | ICD-10-CM

## 2018-01-09 DIAGNOSIS — Z01818 Encounter for other preprocedural examination: Secondary | ICD-10-CM

## 2018-01-11 ENCOUNTER — Other Ambulatory Visit: Payer: Medicare Other | Admitting: *Deleted

## 2018-01-11 ENCOUNTER — Other Ambulatory Visit: Payer: Self-pay

## 2018-01-11 DIAGNOSIS — I2510 Atherosclerotic heart disease of native coronary artery without angina pectoris: Secondary | ICD-10-CM

## 2018-01-11 LAB — BASIC METABOLIC PANEL
BUN/Creatinine Ratio: 16 (ref 10–24)
BUN: 26 mg/dL (ref 8–27)
CO2: 24 mmol/L (ref 20–29)
CREATININE: 1.63 mg/dL — AB (ref 0.76–1.27)
Calcium: 9.3 mg/dL (ref 8.6–10.2)
Chloride: 104 mmol/L (ref 96–106)
GFR, EST AFRICAN AMERICAN: 47 mL/min/{1.73_m2} — AB (ref >59)
GFR, EST NON AFRICAN AMERICAN: 41 mL/min/{1.73_m2} — AB (ref >59)
Glucose: 218 mg/dL — ABNORMAL HIGH (ref 65–99)
Potassium: 4.8 mmol/L (ref 3.5–5.2)
Sodium: 142 mmol/L (ref 134–144)

## 2018-01-11 LAB — CBC WITH DIFFERENTIAL/PLATELET
BASOS: 0 %
Basophils Absolute: 0 10*3/uL (ref 0.0–0.2)
EOS (ABSOLUTE): 0.1 10*3/uL (ref 0.0–0.4)
Eos: 2 %
HEMOGLOBIN: 12 g/dL — AB (ref 13.0–17.7)
Hematocrit: 37.2 % — ABNORMAL LOW (ref 37.5–51.0)
IMMATURE GRANS (ABS): 0 10*3/uL (ref 0.0–0.1)
IMMATURE GRANULOCYTES: 0 %
Lymphocytes Absolute: 1.8 10*3/uL (ref 0.7–3.1)
Lymphs: 30 %
MCH: 29.4 pg (ref 26.6–33.0)
MCHC: 32.3 g/dL (ref 31.5–35.7)
MCV: 91 fL (ref 79–97)
MONOCYTES: 6 %
Monocytes Absolute: 0.4 10*3/uL (ref 0.1–0.9)
NEUTROS PCT: 62 %
Neutrophils Absolute: 3.6 10*3/uL (ref 1.4–7.0)
PLATELETS: 190 10*3/uL (ref 150–379)
RBC: 4.08 x10E6/uL — ABNORMAL LOW (ref 4.14–5.80)
RDW: 14.2 % (ref 12.3–15.4)
WBC: 5.9 10*3/uL (ref 3.4–10.8)

## 2018-01-11 LAB — PROTIME-INR
INR: 1.1 (ref 0.8–1.2)
Prothrombin Time: 10.9 s (ref 9.1–12.0)

## 2018-01-14 ENCOUNTER — Other Ambulatory Visit: Payer: Self-pay

## 2018-01-14 ENCOUNTER — Inpatient Hospital Stay: Admission: RE | Admit: 2018-01-14 | Payer: Medicare Other | Source: Ambulatory Visit

## 2018-01-14 ENCOUNTER — Telehealth: Payer: Self-pay

## 2018-01-14 MED ORDER — DIAZEPAM 5 MG PO TABS: 5.0000 mg | ORAL_TABLET | Freq: Once | ORAL | 0 refills | Status: AC

## 2018-01-14 MED ORDER — FUROSEMIDE 20 MG PO TABS: 20.0000 mg | ORAL_TABLET | Freq: Every day | ORAL | 3 refills | Status: DC

## 2018-01-14 NOTE — Telephone Encounter (Signed)
Patient's wife called and stated that Mr. Andrew's was unable to have is Ct scan done due to being claustrophobic and requested medication to be given.  She stated that "his mind was already made up before we got there" that he was not going to have the procedure done.  Per Ellwood Handler, PA, patient could have a one time dose of valium 5 mg to help calm him enough to have scan done tomorrow 01/15/2018.  Patient's wife stated that she would call to have the CT scheduled and mentioned that he is to have CABG surgery with Dr. Prescott Gum on Friday 01/18/2018.  She asked what should she do, if the medication does not help and he doesn't have the scan done.  I advised her to contact the office and Dr. Prescott Gum would be notified.  She acknowledged receipt.

## 2018-01-15 NOTE — Pre-Procedure Instructions (Signed)
[name] Young.  01/15/2018      [place], [place] - [address] [place] [address] Phone: [phone] Fax: [phone]    Your procedure is scheduled on  Friday  01/18/18  Report to [facility] Admitting at 530 A.M.  Call this number if you have problems the morning of surgery:  [phone]   Remember:  Do not eat food or drink liquids after midnight.  Take these medicines the morning of surgery with A SIP OF WATER - CARVEDILOL (COREG), DOXAZOSIN (CARDURA), OMEPRAZOLE (PRILOSEC)  7 days prior to surgery STOP taking any Aspirin(unless otherwise instructed by your surgeon), Aleve, Naproxen, Ibuprofen, Motrin, Advil, Goody's, BC's, all herbal medications, fish oil, and all vitamins    How to Manage Your Diabetes Before and After Surgery  Why is it important to control my blood sugar before and after surgery? . Improving blood sugar levels before and after surgery helps healing and can limit problems. . A way of improving blood sugar control is eating a healthy diet by: o  Eating less sugar and carbohydrates o  Increasing activity/exercise o  Talking with your doctor about reaching your blood sugar goals . High blood sugars (greater than 180 mg/dL) can raise your risk of infections and slow your recovery, so you will need to focus on controlling your diabetes during the weeks before surgery. . Make sure that the doctor who takes care of your diabetes knows about your planned surgery including the date and location.  How do I manage my blood sugar before surgery? . Check your blood sugar at least 4 times a day, starting 2 days before surgery, to make sure that the level is not too high or low. o Check your blood sugar the morning of your surgery when you wake up and every 2 hours until you get to the Short Stay unit. . If your blood sugar is less than 70 mg/dL, you will need to treat for low blood sugar: o Do not  take insulin. o Treat a low blood sugar (less than 70 mg/dL) with  cup of clear juice (cranberry or apple), 4 glucose tablets, OR glucose gel. Recheck blood sugar in 15 minutes after treatment (to make sure it is greater than 70 mg/dL). If your blood sugar is not greater than 70 mg/dL on recheck, call [phone] o  for further instructions. . Report your blood sugar to the short stay nurse when you get to Short Stay.  . If you are admitted to the hospital after surgery: o Your blood sugar will be checked by the staff and you will probably be given insulin after surgery (instead of oral diabetes medicines) to make sure you have good blood sugar levels. o The goal for blood sugar control after surgery is 80-180 mg/dL.              WHAT DO I DO ABOUT MY DIABETES MEDICATION?   Marland Kitchen Do not take oral diabetes medicines (pills) the morning of surgery.  . THE NIGHT BEFORE SURGERY, take ___________ units of ___________insulin.       . THE MORNING OF SURGERY, take _____________ units of __________insulin.  . The day of surgery, do not take other diabetes injectables, including Byetta (exenatide), Bydureon (exenatide ER), Victoza (liraglutide), or Trulicity (dulaglutide).  . If your CBG is greater than 220 mg/dL, you may take  of your sliding scale (correction) dose of insulin.  Other Instructions:  Patient Signature:  Date:   Nurse Signature:  Date:   Reviewed and Endorsed by [facility] Patient Education Committee, August 2015  Do not wear jewelry, make-up or nail polish.  Do not wear lotions, powders, or perfumes, or deodorant.  Do not shave 48 hours prior to surgery.  Men may shave face and neck.  Do not bring valuables to the hospital.  [facility] [place] is not responsible for any belongings or valuables.  Contacts, dentures or bridgework may not be worn into surgery.  Leave your suitcase in the car.  After surgery it may be brought to your room.  For patients  admitted to the hospital, discharge time will be determined by your treatment team.  Patients discharged the day of surgery will not be allowed to drive home.   Name and phone number of your driver:   Special instructions:  [place] - Preparing for Surgery  Before surgery, you can play an important role.  Because skin is not sterile, your skin needs to be as free of germs as possible.  You can reduce the number of germs on you skin by washing with CHG (chlorahexidine gluconate) soap before surgery.  CHG is an antiseptic cleaner which kills germs and bonds with the skin to continue killing germs even after washing.  Please DO NOT use if you have an allergy to CHG or antibacterial soaps.  If your skin becomes reddened/irritated stop using the CHG and inform your nurse when you arrive at Short Stay.  Do not shave (including legs and underarms) for at least 48 hours prior to the first CHG shower.  You may shave your face.  Please follow these instructions carefully:   1.  Shower with CHG Soap the night before surgery and the                                morning of Surgery.  2.  If you choose to wash your hair, wash your hair first as usual with your       normal shampoo.  3.  After you shampoo, rinse your hair and body thoroughly to remove the                      Shampoo.  4.  Use CHG as you would any other liquid soap.  You can apply chg directly       to the skin and wash gently with scrungie or a clean washcloth.  5.  Apply the CHG Soap to your body ONLY FROM THE NECK DOWN.        Do not use on open wounds or open sores.  Avoid contact with your eyes,       ears, mouth and genitals (private parts).  Wash genitals (private parts)       with your normal soap.  6.  Wash thoroughly, paying special attention to the area where your surgery        will be performed.  7.  Thoroughly rinse your body with warm water from the neck down.  8.  DO NOT shower/wash with your normal soap after using and  rinsing off       the CHG Soap.  9.  Pat yourself dry with a clean towel.            10.  Wear clean pajamas.  11.  Place clean sheets on your bed the night of your first shower and do not        sleep with pets.  Day of Surgery  Do not apply any lotions/deoderants the morning of surgery.  Please wear clean clothes to the hospital/surgery center.     Please read over the following fact sheets that you were given. Pain Booklet, MRSA Information and Surgical Site Infection Prevention

## 2018-01-16 ENCOUNTER — Encounter (HOSPITAL_COMMUNITY): Payer: Medicare Other

## 2018-01-16 ENCOUNTER — Ambulatory Visit (HOSPITAL_COMMUNITY)
Admission: RE | Admit: 2018-01-16 | Discharge: 2018-01-16 | Disposition: A | Discharge status: Home / Self Care | Payer: Medicare Other | Source: Ambulatory Visit | Attending: Cardiothoracic Surgery | Admitting: Cardiothoracic Surgery

## 2018-01-16 ENCOUNTER — Encounter (HOSPITAL_COMMUNITY): Payer: Self-pay

## 2018-01-16 ENCOUNTER — Ambulatory Visit (HOSPITAL_BASED_OUTPATIENT_CLINIC_OR_DEPARTMENT_OTHER)
Admission: RE | Admit: 2018-01-16 | Discharge: 2018-01-16 | Disposition: A | Discharge status: Home / Self Care | Payer: Medicare Other | Source: Ambulatory Visit | Attending: Cardiothoracic Surgery | Admitting: Cardiothoracic Surgery

## 2018-01-16 ENCOUNTER — Other Ambulatory Visit: Payer: Self-pay

## 2018-01-16 ENCOUNTER — Other Ambulatory Visit (HOSPITAL_COMMUNITY): Payer: Medicare Other

## 2018-01-16 DIAGNOSIS — Z01810 Encounter for preprocedural cardiovascular examination: Secondary | ICD-10-CM

## 2018-01-16 DIAGNOSIS — I2510 Atherosclerotic heart disease of native coronary artery without angina pectoris: Secondary | ICD-10-CM

## 2018-01-16 DIAGNOSIS — Z01812 Encounter for preprocedural laboratory examination: Secondary | ICD-10-CM | POA: Insufficient documentation

## 2018-01-16 HISTORY — DX: Unspecified osteoarthritis, unspecified site: M19.90

## 2018-01-16 HISTORY — DX: Atherosclerotic heart disease of native coronary artery without angina pectoris: I25.10

## 2018-01-16 HISTORY — DX: Dyspnea, unspecified: R06.00

## 2018-01-16 HISTORY — DX: Unspecified jaundice: R17

## 2018-01-16 HISTORY — DX: Cardiac arrhythmia, unspecified: I49.9

## 2018-01-16 HISTORY — DX: Pneumonia, unspecified organism: J18.9

## 2018-01-16 LAB — CBC
HCT: 37 % — ABNORMAL LOW (ref 39.0–52.0)
Hemoglobin: 11.7 g/dL — ABNORMAL LOW (ref 13.0–17.0)
MCH: 29.4 pg (ref 26.0–34.0)
MCHC: 31.6 g/dL (ref 30.0–36.0)
MCV: 93 fL (ref 78.0–100.0)
Platelets: 166 10*3/uL (ref 150–400)
RBC: 3.98 MIL/uL — ABNORMAL LOW (ref 4.22–5.81)
RDW: 14.6 % (ref 11.5–15.5)
WBC: 6.6 10*3/uL (ref 4.0–10.5)

## 2018-01-16 LAB — SURGICAL PCR SCREEN
MRSA, PCR: NEGATIVE
Staphylococcus aureus: NEGATIVE

## 2018-01-16 LAB — COMPREHENSIVE METABOLIC PANEL
ALT: 22 U/L (ref 17–63)
AST: 28 U/L (ref 15–41)
Albumin: 3.6 g/dL (ref 3.5–5.0)
Alkaline Phosphatase: 26 U/L — ABNORMAL LOW (ref 38–126)
Anion gap: 11 (ref 5–15)
BUN: 30 mg/dL — ABNORMAL HIGH (ref 6–20)
CO2: 21 mmol/L — ABNORMAL LOW (ref 22–32)
Calcium: 8.8 mg/dL — ABNORMAL LOW (ref 8.9–10.3)
Chloride: 109 mmol/L (ref 101–111)
Creatinine, Ser: 1.66 mg/dL — ABNORMAL HIGH (ref 0.61–1.24)
GFR calc Af Amer: 45 mL/min — ABNORMAL LOW (ref >60)
GFR calc non Af Amer: 39 mL/min — ABNORMAL LOW (ref >60)
Glucose, Bld: 147 mg/dL — ABNORMAL HIGH (ref 65–99)
Potassium: 4 mmol/L (ref 3.5–5.1)
Sodium: 141 mmol/L (ref 135–145)
Total Bilirubin: 0.8 mg/dL (ref 0.3–1.2)
Total Protein: 6.4 g/dL — ABNORMAL LOW (ref 6.5–8.1)

## 2018-01-16 LAB — BLOOD GAS, ARTERIAL
Acid-Base Excess: 0.8 mmol/L (ref 0.0–2.0)
Bicarbonate: 24.3 mmol/L (ref 20.0–28.0)
Drawn by: 449841
FIO2: 21
O2 Saturation: 97.8 %
Patient temperature: 98.6
pCO2 arterial: 35.2 mmHg (ref 32.0–48.0)
pH, Arterial: 7.454 — ABNORMAL HIGH (ref 7.350–7.450)
pO2, Arterial: 99.8 mmHg (ref 83.0–108.0)

## 2018-01-16 LAB — HEMOGLOBIN A1C
Hgb A1c MFr Bld: 6.8 % — ABNORMAL HIGH (ref 4.8–5.6)
Mean Plasma Glucose: 148.46 mg/dL

## 2018-01-16 LAB — PULMONARY FUNCTION TEST
DL/VA % pred: 96 %
DL/VA: 4.44 ml/min/mmHg/L
DLCO unc % pred: 67 %
DLCO unc: 21.89 ml/min/mmHg
FEF 25-75 Post: 2.67 L/sec
FEF 25-75 Pre: 2.56 L/sec
FEF2575-%Change-Post: 4 %
FEF2575-%Pred-Post: 121 %
FEF2575-%Pred-Pre: 116 %
FEV1-%Change-Post: 0 %
FEV1-%Pred-Post: 85 %
FEV1-%Pred-Pre: 84 %
FEV1-Post: 2.59 L
FEV1-Pre: 2.57 L
FEV1FVC-%Change-Post: 3 %
FEV1FVC-%Pred-Pre: 111 %
FEV6-%Change-Post: -3 %
FEV6-%Pred-Post: 77 %
FEV6-%Pred-Pre: 80 %
FEV6-Post: 3.08 L
FEV6-Pre: 3.19 L
FEV6FVC-%Pred-Post: 106 %
FEV6FVC-%Pred-Pre: 106 %
FVC-%Change-Post: -2 %
FVC-%Pred-Post: 73 %
FVC-%Pred-Pre: 75 %
FVC-Post: 3.1 L
FVC-Pre: 3.19 L
Post FEV1/FVC ratio: 83 %
Post FEV6/FVC ratio: 100 %
Pre FEV1/FVC ratio: 81 %
Pre FEV6/FVC Ratio: 100 %

## 2018-01-16 LAB — URINALYSIS, ROUTINE W REFLEX MICROSCOPIC
Bacteria, UA: NONE SEEN
Bilirubin Urine: NEGATIVE
Glucose, UA: NEGATIVE mg/dL
Hgb urine dipstick: NEGATIVE
Ketones, ur: NEGATIVE mg/dL
Nitrite: NEGATIVE
Protein, ur: NEGATIVE mg/dL
Specific Gravity, Urine: 1.024 (ref 1.005–1.030)
pH: 6 (ref 5.0–8.0)

## 2018-01-16 LAB — PROTIME-INR
INR: 1.04
Prothrombin Time: 13.5 seconds (ref 11.4–15.2)

## 2018-01-16 LAB — APTT: aPTT: 27 seconds (ref 24–36)

## 2018-01-16 LAB — GLUCOSE, CAPILLARY: Glucose-Capillary: 150 mg/dL — ABNORMAL HIGH (ref 65–99)

## 2018-01-16 LAB — ABO/RH: ABO/RH(D): A POS

## 2018-01-16 MED ORDER — ALBUTEROL SULFATE (2.5 MG/3ML) 0.083% IN NEBU
2.5000 mg | INHALATION_SOLUTION | Freq: Once | RESPIRATORY_TRACT | Status: AC
  Administered 2018-01-16: 2.5 mg via RESPIRATORY_TRACT

## 2018-01-16 NOTE — Progress Notes (Signed)
   01/16/18 1318  OBSTRUCTIVE SLEEP APNEA  Have you ever been diagnosed with sleep apnea through a sleep study? No  Do you snore loudly (loud enough to be heard through closed doors)?  1  Do you often feel tired, fatigued, or sleepy during the daytime (such as falling asleep during driving or talking to someone)? 0  Has anyone observed you stop breathing during your sleep? 0  Do you have, or are you being treated for high blood pressure? 1  BMI more than 35 kg/m2? 1  Age > 50 (1-yes) 1  Neck circumference greater than:Male 16 inches or larger, Male 17inches or larger? 0  Male Gender (Yes=1) 1  Obstructive Sleep Apnea Score 5  Score 5 or greater  Results sent to PCP

## 2018-01-16 NOTE — Progress Notes (Signed)
Pre-op Cardiac Surgery  Carotid Findings:  1-39% ICA plaquing. Vertebral artery flow is antegrade.   Upper Extremity Right Left  Brachial Pressures 143T 156T  Radial Waveforms T T  Ulnar Waveforms T T  Palmar Arch (Allen's Test) WNL WNL   Findings:      Lower  Extremity Right Left  Dorsalis Pedis 150T 174T  Anterior Tibial    Posterior Tibial 162T 168T  Ankle/Brachial Indices 1.04     Findings:  ABIs are within normal limits

## 2018-01-17 ENCOUNTER — Other Ambulatory Visit: Payer: Self-pay | Admitting: Cardiothoracic Surgery

## 2018-01-17 ENCOUNTER — Encounter (HOSPITAL_COMMUNITY): Payer: Self-pay | Admitting: Certified Registered Nurse Anesthetist

## 2018-01-17 ENCOUNTER — Ambulatory Visit
Admission: RE | Admit: 2018-01-17 | Discharge: 2018-01-17 | Disposition: A | Discharge status: Home / Self Care | Payer: Medicare Other | Source: Ambulatory Visit | Attending: Cardiothoracic Surgery | Admitting: Cardiothoracic Surgery

## 2018-01-17 DIAGNOSIS — R0602 Shortness of breath: Secondary | ICD-10-CM

## 2018-01-17 DIAGNOSIS — Z01818 Encounter for other preprocedural examination: Secondary | ICD-10-CM

## 2018-01-17 DIAGNOSIS — I272 Pulmonary hypertension, unspecified: Secondary | ICD-10-CM

## 2018-01-17 MED ORDER — CEFUROXIME SODIUM 1.5 G IV SOLR
1.5000 g | INTRAVENOUS | Status: AC
  Administered 2018-01-18: 1.5 g via INTRAVENOUS
  Administered 2018-01-18: .75 g via INTRAVENOUS
  Filled 2018-01-17: qty 1.5

## 2018-01-17 MED ORDER — SODIUM CHLORIDE 0.9 % IV SOLN
1500.0000 mg | INTRAVENOUS | Status: AC
  Administered 2018-01-18: 1500 mg via INTRAVENOUS
  Filled 2018-01-17: qty 1500

## 2018-01-17 MED ORDER — DEXTROSE 5 % IV SOLN
0.0000 ug/min | INTRAVENOUS | Status: DC
  Filled 2018-01-17: qty 4

## 2018-01-17 MED ORDER — TRANEXAMIC ACID (OHS) PUMP PRIME SOLUTION
2.0000 mg/kg | INTRAVENOUS | Status: DC
  Filled 2018-01-17: qty 2.55

## 2018-01-17 MED ORDER — MILRINONE LACTATE IN DEXTROSE 20-5 MG/100ML-% IV SOLN
0.1250 ug/kg/min | INTRAVENOUS | Status: AC
  Administered 2018-01-18: .25 ug/kg/min via INTRAVENOUS
  Filled 2018-01-17: qty 100

## 2018-01-17 MED ORDER — SODIUM CHLORIDE 0.9 % IV SOLN
750.0000 mg | INTRAVENOUS | Status: DC
  Filled 2018-01-17: qty 750

## 2018-01-17 MED ORDER — DOPAMINE-DEXTROSE 3.2-5 MG/ML-% IV SOLN
0.0000 ug/kg/min | INTRAVENOUS | Status: AC
  Administered 2018-01-18: 2 ug/min via INTRAVENOUS
  Filled 2018-01-17: qty 250

## 2018-01-17 MED ORDER — NITROGLYCERIN IN D5W 200-5 MCG/ML-% IV SOLN
2.0000 ug/min | INTRAVENOUS | Status: AC
  Administered 2018-01-18: 16.67 ug/min via INTRAVENOUS
  Filled 2018-01-17: qty 250

## 2018-01-17 MED ORDER — TRANEXAMIC ACID 1000 MG/10ML IV SOLN
1.5000 mg/kg/h | INTRAVENOUS | Status: AC
  Administered 2018-01-18: 1.5 mg/kg/h via INTRAVENOUS
  Administered 2018-01-18: 11:00:00 via INTRAVENOUS
  Filled 2018-01-17: qty 25

## 2018-01-17 MED ORDER — SODIUM CHLORIDE 0.9 % IV SOLN
INTRAVENOUS | Status: AC
  Administered 2018-01-18: 1.9 [IU]/h via INTRAVENOUS
  Filled 2018-01-17: qty 1

## 2018-01-17 MED ORDER — SODIUM CHLORIDE 0.9 % IV SOLN
INTRAVENOUS | Status: DC
  Filled 2018-01-17: qty 30

## 2018-01-17 MED ORDER — TRANEXAMIC ACID (OHS) BOLUS VIA INFUSION
15.0000 mg/kg | INTRAVENOUS | Status: AC
  Administered 2018-01-18: 1915.5 mg via INTRAVENOUS
  Filled 2018-01-17: qty 1916

## 2018-01-17 MED ORDER — POTASSIUM CHLORIDE 2 MEQ/ML IV SOLN
80.0000 meq | INTRAVENOUS | Status: DC
Start: 2018-01-18 — End: 2018-01-18
  Filled 2018-01-17: qty 40

## 2018-01-17 MED ORDER — DEXMEDETOMIDINE HCL IN NACL 400 MCG/100ML IV SOLN
0.1000 ug/kg/h | INTRAVENOUS | Status: AC
  Administered 2018-01-18: .5 ug/kg/h via INTRAVENOUS
  Filled 2018-01-17: qty 100

## 2018-01-17 MED ORDER — PLASMA-LYTE 148 IV SOLN
INTRAVENOUS | Status: AC
  Administered 2018-01-18: 500 mL
  Filled 2018-01-17: qty 2.5

## 2018-01-17 MED ORDER — MAGNESIUM SULFATE 50 % IJ SOLN
40.0000 meq | INTRAMUSCULAR | Status: DC
Start: 2018-01-18 — End: 2018-01-18
  Filled 2018-01-17: qty 9.85

## 2018-01-17 MED ORDER — PHENYLEPHRINE HCL 10 MG/ML IJ SOLN
30.0000 ug/min | INTRAMUSCULAR | Status: DC
  Filled 2018-01-17: qty 2

## 2018-01-17 NOTE — Progress Notes (Signed)
Anesthesia Chart Review:   Case:  [id number] Date/Time:  01/18/18 0715   Procedures:      CORONARY ARTERY BYPASS GRAFTING (CABG) (N/A Chest)     TRANSESOPHAGEAL ECHOCARDIOGRAM (TEE) (N/A )   Anesthesia type:  General   Pre-op diagnosis:  CAD   Location:  [facility] OR ROOM 17 / [facility]   Surgeon:  Ivin Poot, MD      DISCUSSION: Pt is a 77 year old male with severe multivessel CAD, HTN, DM, CKD.    VS: BP (!) 157/57   Pulse 68   Temp 36.4 C   Resp 20   Ht 5\' 10"  (1.778 m)   Wt 281 lb 8 oz (127.7 kg)   SpO2 97%   BMI 40.39 kg/m    PROVIDERS:  PCP is Ria Bush, MD Cardiologist is Nelva Bush, MD   LABS:  - Cr 1.66, BUN 30.  It appears pt's baseline Cr ~1.3-1.4  (all labs ordered are listed, but only abnormal results are displayed)  Labs Reviewed  GLUCOSE, CAPILLARY - Abnormal; Notable for the following components:      Result Value   Glucose-Capillary 150 (*)    All other components within normal limits  BLOOD GAS, ARTERIAL - Abnormal; Notable for the following components:   pH, Arterial 7.454 (*)    All other components within normal limits  CBC - Abnormal; Notable for the following components:   RBC 3.98 (*)    Hemoglobin 11.7 (*)    HCT 37.0 (*)    All other components within normal limits  COMPREHENSIVE METABOLIC PANEL - Abnormal; Notable for the following components:   CO2 21 (*)    Glucose, Bld 147 (*)    BUN 30 (*)    Creatinine, Ser 1.66 (*)    Calcium 8.8 (*)    Total Protein 6.4 (*)    Alkaline Phosphatase 26 (*)    GFR calc non Af Amer 39 (*)    GFR calc Af Amer 45 (*)    All other components within normal limits  HEMOGLOBIN A1C - Abnormal; Notable for the following components:   Hgb A1c MFr Bld 6.8 (*)    All other components within normal limits  URINALYSIS, ROUTINE W REFLEX MICROSCOPIC - Abnormal; Notable for the following components:   Leukocytes, UA TRACE (*)    All other components within normal limits  SURGICAL PCR SCREEN  APTT   PROTIME-INR  TYPE AND SCREEN  ABO/RH     IMAGES:  CXR 01/16/18: No active cardiopulmonary disease   EKG 12/28/17: NSR.  Frequent PVCs.  Nonspecific ST/T abnormality   CV:  Doppler pre-CABG 01/16/18:  - Right Carotid: The extracranial vessels were near-normal with only minimal wall thickening or plaque. - Left Carotid: The extracranial vessels were near-normal with only minimal wall thickening or plaque. - Vertebrals: Bilateral vertebral arteries demonstrate antegrade flow. - Subclavians: Normal flow hemodynamics were seen in bilateral subclavian arteries.   Cardiac cath 12/21/17:  1. Significant 3-vessel coronary artery disease, including 75% mid LAD, 90% D1, and 80-99% OM1/2 stenoses, as well as chronic total occlusion of the proximal RCA with left-to-right collaterals. 2. Mildly elevated left ventricular filling pressure. 3. Frequent PVC's noted during procedure.  Echo 11/23/17:  - Left ventricle: The cavity size was normal. There was mild concentric hypertrophy. Systolic function was normal. The estimated ejection fraction was in the range of 50% to 55%. Wall motion was normal; there were no regional wall motion abnormalities. Doppler parameters are  consistent with abnormal left ventricular relaxation (grade 1 diastolic dysfunction). - Aortic valve: Transvalvular velocity was within the normal range. There was no stenosis. There was no regurgitation. - Mitral valve: Moderately thickened, moderately calcified anterior leaflet., consistent with rheumatic disease. Transvalvular velocity was within the normal range. There was no evidence for stenosis. There was trivial regurgitation. - Right ventricle: The cavity size was normal. Wall thickness was normal. Systolic function was normal. - Atrial septum: No defect or patent foramen ovale was identified by color flow Doppler. - Tricuspid valve: There was trivial regurgitation. - Pulmonary arteries: Systolic pressure was within the normal  range. PA peak pressure: 20 mm Hg (S).   Past Medical History:  Diagnosis Date  . Arthritis   . Chronic kidney disease    kidney stones  . Coronary artery disease   . Dyspnea   . Dysrhythmia   . GERD (gastroesophageal reflux disease)   . History of cholelithiasis   . History of kidney stones    ca ox [name] @ [facility]) now [place]  . History of pneumonia   . HLD (hyperlipidemia)   . HTN (hypertension)   . Jaundice    age 78  . Pneumonia    years ago   . T2DM (type 2 diabetes mellitus) ([place]) 2010    Past Surgical History:  Procedure Laterality Date  . CARPAL TUNNEL RELEASE Bilateral   . CATARACT EXTRACTION, BILATERAL    . COLONOSCOPY  11/2012   12 adenomatous polyps, diverticulosis, rec rpt 1 yr Ardis Hughs)  . COLONOSCOPY  12/2013   3 polyps, diverticulosis, rec rpt 3 yrs Ardis Hughs)  . KNEE CARTILAGE SURGERY Left   . LEFT HEART CATH AND CORONARY ANGIOGRAPHY N/A 12/21/2017   Procedure: LEFT HEART CATH AND CORONARY ANGIOGRAPHY;  Surgeon: Nelva Bush, MD;  Location: [facility];  Service: Cardiovascular;  Laterality: N/A;  . LITHOTRIPSY    . UMBILICAL HERNIA REPAIR     with mesh    MEDICATIONS: . aspirin EC 81 MG tablet  . carvedilol (COREG) 6.25 MG tablet  . Cholecalciferol (VITAMIN D) 2000 UNITS CAPS  . doxazosin (CARDURA) 2 MG tablet  . fenofibrate (TRICOR) 145 MG tablet  . furosemide (LASIX) 20 MG tablet  . lisinopril (PRINIVIL,ZESTRIL) 20 MG tablet  . metFORMIN (GLUCOPHAGE) 500 MG tablet  . Multiple Vitamins-Minerals (MACULAR VITAMIN BENEFIT PO)  . nitroGLYCERIN (NITROSTAT) 0.4 MG SL tablet  . Omega-3 Fatty Acids (FISH OIL) 1200 MG CAPS  . omeprazole (PRILOSEC) 40 MG capsule  . simvastatin (ZOCOR) 20 MG tablet  . sitaGLIPtin (JANUVIA) 50 MG tablet   No current facility-administered medications for this encounter.    Derrill Memo ON 01/18/2018] cefUROXime (ZINACEF) 1.5 g in sodium chloride 0.9 % 100 mL IVPB  . [START ON 01/18/2018] cefUROXime (ZINACEF) 750  mg in sodium chloride 0.9 % 100 mL IVPB  . [START ON 01/18/2018] dexmedetomidine (PRECEDEX) 400 MCG/100ML (4 mcg/mL) infusion  . [START ON 01/18/2018] DOPamine (INTROPIN) 800 mg in dextrose 5 % 250 mL (3.2 mg/mL) infusion  . [START ON 01/18/2018] EPINEPHrine (ADRENALIN) 4 mg in dextrose 5 % 250 mL (0.016 mg/mL) infusion  . [START ON 01/18/2018] heparin 2,500 Units, papaverine 30 mg in electrolyte-148 (PLASMALYTE-148) 500 mL irrigation  . [START ON 01/18/2018] heparin 30,000 units/NS 1000 mL solution for CELLSAVER  . [START ON 01/18/2018] insulin regular (NOVOLIN R,HUMULIN R) 100 Units in sodium chloride 0.9 % 100 mL (1 Units/mL) infusion  . [START ON 01/18/2018] magnesium sulfate (IV Push/IM) injection 40 mEq  . [  START ON 01/18/2018] milrinone (PRIMACOR) 20 MG/100 ML (0.2 mg/mL) infusion  . [START ON 01/18/2018] nitroGLYCERIN 50 mg in dextrose 5 % 250 mL (0.2 mg/mL) infusion  . [START ON 01/18/2018] phenylephrine (NEO-SYNEPHRINE) 20 mg in sodium chloride 0.9 % 250 mL (0.08 mg/mL) infusion  . [START ON 01/18/2018] potassium chloride injection 80 mEq  . [START ON 01/18/2018] tranexamic acid (CYKLOKAPRON) 2,500 mg in sodium chloride 0.9 % 250 mL (10 mg/mL) infusion  . [START ON 01/18/2018] tranexamic acid (CYKLOKAPRON) bolus via infusion - over 30 minutes 1,915.5 mg  . [START ON 01/18/2018] tranexamic acid (CYKLOKAPRON) pump prime solution 255 mg  . [START ON 01/18/2018] vancomycin (VANCOCIN) 1,500 mg in sodium chloride 0.9 % 250 mL IVPB    If no changes, I anticipate pt can proceed with surgery as scheduled.   Willeen Cass, FNP-BC [facility]/Anesthesiology Phone: [phone] 01/17/2018 10:48 AM

## 2018-01-18 ENCOUNTER — Inpatient Hospital Stay (HOSPITAL_COMMUNITY): Payer: Medicare Other | Admitting: Certified Registered Nurse Anesthetist

## 2018-01-18 ENCOUNTER — Inpatient Hospital Stay (HOSPITAL_COMMUNITY)
Admission: RE | Admit: 2018-01-18 | Discharge: 2018-01-28 | DRG: 235 | Disposition: A | Discharge status: Rehabilitation Facility | Payer: Medicare Other | Attending: Cardiothoracic Surgery | Admitting: Cardiothoracic Surgery

## 2018-01-18 ENCOUNTER — Ambulatory Visit (HOSPITAL_COMMUNITY): Payer: Medicare Other | Attending: Cardiothoracic Surgery

## 2018-01-18 ENCOUNTER — Inpatient Hospital Stay (HOSPITAL_COMMUNITY)
Admission: RE | Disposition: A | Discharge status: Rehabilitation Facility | Payer: Self-pay | Source: Home / Self Care | Attending: Cardiothoracic Surgery

## 2018-01-18 ENCOUNTER — Inpatient Hospital Stay (HOSPITAL_COMMUNITY): Payer: Medicare Other

## 2018-01-18 DIAGNOSIS — Z79899 Other long term (current) drug therapy: Secondary | ICD-10-CM

## 2018-01-18 DIAGNOSIS — I10 Essential (primary) hypertension: Secondary | ICD-10-CM | POA: Diagnosis not present

## 2018-01-18 DIAGNOSIS — Z87442 Personal history of urinary calculi: Secondary | ICD-10-CM | POA: Diagnosis not present

## 2018-01-18 DIAGNOSIS — Z7984 Long term (current) use of oral hypoglycemic drugs: Secondary | ICD-10-CM

## 2018-01-18 DIAGNOSIS — I2510 Atherosclerotic heart disease of native coronary artery without angina pectoris: Secondary | ICD-10-CM

## 2018-01-18 DIAGNOSIS — E669 Obesity, unspecified: Secondary | ICD-10-CM | POA: Diagnosis present

## 2018-01-18 DIAGNOSIS — E1122 Type 2 diabetes mellitus with diabetic chronic kidney disease: Secondary | ICD-10-CM | POA: Diagnosis present

## 2018-01-18 DIAGNOSIS — E1142 Type 2 diabetes mellitus with diabetic polyneuropathy: Secondary | ICD-10-CM | POA: Diagnosis not present

## 2018-01-18 DIAGNOSIS — I340 Nonrheumatic mitral (valve) insufficiency: Secondary | ICD-10-CM | POA: Diagnosis not present

## 2018-01-18 DIAGNOSIS — Z7982 Long term (current) use of aspirin: Secondary | ICD-10-CM

## 2018-01-18 DIAGNOSIS — N170 Acute kidney failure with tubular necrosis: Secondary | ICD-10-CM | POA: Diagnosis not present

## 2018-01-18 DIAGNOSIS — N183 Chronic kidney disease, stage 3 (moderate): Secondary | ICD-10-CM | POA: Diagnosis present

## 2018-01-18 DIAGNOSIS — N179 Acute kidney failure, unspecified: Secondary | ICD-10-CM | POA: Diagnosis present

## 2018-01-18 DIAGNOSIS — E785 Hyperlipidemia, unspecified: Secondary | ICD-10-CM | POA: Diagnosis present

## 2018-01-18 DIAGNOSIS — G479 Sleep disorder, unspecified: Secondary | ICD-10-CM | POA: Diagnosis not present

## 2018-01-18 DIAGNOSIS — I493 Ventricular premature depolarization: Secondary | ICD-10-CM | POA: Diagnosis not present

## 2018-01-18 DIAGNOSIS — Z951 Presence of aortocoronary bypass graft: Secondary | ICD-10-CM

## 2018-01-18 DIAGNOSIS — E8770 Fluid overload, unspecified: Secondary | ICD-10-CM | POA: Diagnosis not present

## 2018-01-18 DIAGNOSIS — N189 Chronic kidney disease, unspecified: Secondary | ICD-10-CM | POA: Diagnosis present

## 2018-01-18 DIAGNOSIS — R601 Generalized edema: Secondary | ICD-10-CM | POA: Diagnosis not present

## 2018-01-18 DIAGNOSIS — I129 Hypertensive chronic kidney disease with stage 1 through stage 4 chronic kidney disease, or unspecified chronic kidney disease: Secondary | ICD-10-CM | POA: Diagnosis present

## 2018-01-18 DIAGNOSIS — Z87891 Personal history of nicotine dependence: Secondary | ICD-10-CM

## 2018-01-18 DIAGNOSIS — Z6841 Body Mass Index (BMI) 40.0 and over, adult: Secondary | ICD-10-CM

## 2018-01-18 DIAGNOSIS — D62 Acute posthemorrhagic anemia: Secondary | ICD-10-CM | POA: Diagnosis not present

## 2018-01-18 DIAGNOSIS — K219 Gastro-esophageal reflux disease without esophagitis: Secondary | ICD-10-CM | POA: Diagnosis present

## 2018-01-18 DIAGNOSIS — I25110 Atherosclerotic heart disease of native coronary artery with unstable angina pectoris: Principal | ICD-10-CM | POA: Diagnosis present

## 2018-01-18 DIAGNOSIS — K5900 Constipation, unspecified: Secondary | ICD-10-CM | POA: Diagnosis not present

## 2018-01-18 DIAGNOSIS — G4730 Sleep apnea, unspecified: Secondary | ICD-10-CM | POA: Diagnosis present

## 2018-01-18 DIAGNOSIS — Z09 Encounter for follow-up examination after completed treatment for conditions other than malignant neoplasm: Secondary | ICD-10-CM

## 2018-01-18 DIAGNOSIS — Z8701 Personal history of pneumonia (recurrent): Secondary | ICD-10-CM

## 2018-01-18 DIAGNOSIS — J189 Pneumonia, unspecified organism: Secondary | ICD-10-CM | POA: Diagnosis not present

## 2018-01-18 DIAGNOSIS — Z9689 Presence of other specified functional implants: Secondary | ICD-10-CM

## 2018-01-18 DIAGNOSIS — R000 Tachycardia, unspecified: Secondary | ICD-10-CM | POA: Diagnosis present

## 2018-01-18 DIAGNOSIS — R5381 Other malaise: Secondary | ICD-10-CM | POA: Diagnosis not present

## 2018-01-18 DIAGNOSIS — E1165 Type 2 diabetes mellitus with hyperglycemia: Secondary | ICD-10-CM | POA: Diagnosis not present

## 2018-01-18 DIAGNOSIS — E46 Unspecified protein-calorie malnutrition: Secondary | ICD-10-CM | POA: Diagnosis not present

## 2018-01-18 DIAGNOSIS — F411 Generalized anxiety disorder: Secondary | ICD-10-CM | POA: Diagnosis not present

## 2018-01-18 HISTORY — PX: TEE WITHOUT CARDIOVERSION: SHX5443

## 2018-01-18 HISTORY — PX: CORONARY ARTERY BYPASS GRAFT: SHX141

## 2018-01-18 LAB — POCT I-STAT, CHEM 8
BUN: 22 mg/dL — ABNORMAL HIGH (ref 6–20)
BUN: 19 mg/dL (ref 6–20)
BUN: 21 mg/dL — ABNORMAL HIGH (ref 6–20)
BUN: 21 mg/dL — ABNORMAL HIGH (ref 6–20)
BUN: 21 mg/dL — ABNORMAL HIGH (ref 6–20)
BUN: 21 mg/dL — ABNORMAL HIGH (ref 6–20)
BUN: 21 mg/dL — ABNORMAL HIGH (ref 6–20)
Calcium, Ion: 1.22 mmol/L (ref 1.15–1.40)
Calcium, Ion: 1.1 mmol/L — ABNORMAL LOW (ref 1.15–1.40)
Calcium, Ion: 1.21 mmol/L (ref 1.15–1.40)
Calcium, Ion: 1.1 mmol/L — ABNORMAL LOW (ref 1.15–1.40)
Calcium, Ion: 1.11 mmol/L — ABNORMAL LOW (ref 1.15–1.40)
Calcium, Ion: 1.13 mmol/L — ABNORMAL LOW (ref 1.15–1.40)
Calcium, Ion: 1.13 mmol/L — ABNORMAL LOW (ref 1.15–1.40)
Chloride: 106 mmol/L (ref 101–111)
Chloride: 104 mmol/L (ref 101–111)
Chloride: 107 mmol/L (ref 101–111)
Chloride: 104 mmol/L (ref 101–111)
Chloride: 105 mmol/L (ref 101–111)
Chloride: 105 mmol/L (ref 101–111)
Chloride: 108 mmol/L (ref 101–111)
Creatinine, Ser: 1.5 mg/dL — ABNORMAL HIGH (ref 0.61–1.24)
Creatinine, Ser: 1.2 mg/dL (ref 0.61–1.24)
Creatinine, Ser: 1.4 mg/dL — ABNORMAL HIGH (ref 0.61–1.24)
Creatinine, Ser: 1.3 mg/dL — ABNORMAL HIGH (ref 0.61–1.24)
Creatinine, Ser: 1.3 mg/dL — ABNORMAL HIGH (ref 0.61–1.24)
Creatinine, Ser: 1.3 mg/dL — ABNORMAL HIGH (ref 0.61–1.24)
Creatinine, Ser: 1.4 mg/dL — ABNORMAL HIGH (ref 0.61–1.24)
Glucose, Bld: 153 mg/dL — ABNORMAL HIGH (ref 65–99)
Glucose, Bld: 123 mg/dL — ABNORMAL HIGH (ref 65–99)
Glucose, Bld: 133 mg/dL — ABNORMAL HIGH (ref 65–99)
Glucose, Bld: 144 mg/dL — ABNORMAL HIGH (ref 65–99)
Glucose, Bld: 157 mg/dL — ABNORMAL HIGH (ref 65–99)
Glucose, Bld: 166 mg/dL — ABNORMAL HIGH (ref 65–99)
Glucose, Bld: 144 mg/dL — ABNORMAL HIGH (ref 65–99)
HCT: 33 % — ABNORMAL LOW (ref 39.0–52.0)
HCT: 26 % — ABNORMAL LOW (ref 39.0–52.0)
HCT: 30 % — ABNORMAL LOW (ref 39.0–52.0)
HCT: 26 % — ABNORMAL LOW (ref 39.0–52.0)
HCT: 26 % — ABNORMAL LOW (ref 39.0–52.0)
HCT: 26 % — ABNORMAL LOW (ref 39.0–52.0)
HCT: 28 % — ABNORMAL LOW (ref 39.0–52.0)
Hemoglobin: 11.2 g/dL — ABNORMAL LOW (ref 13.0–17.0)
Hemoglobin: 10.2 g/dL — ABNORMAL LOW (ref 13.0–17.0)
Hemoglobin: 8.8 g/dL — ABNORMAL LOW (ref 13.0–17.0)
Hemoglobin: 8.8 g/dL — ABNORMAL LOW (ref 13.0–17.0)
Hemoglobin: 8.8 g/dL — ABNORMAL LOW (ref 13.0–17.0)
Hemoglobin: 8.8 g/dL — ABNORMAL LOW (ref 13.0–17.0)
Hemoglobin: 9.5 g/dL — ABNORMAL LOW (ref 13.0–17.0)
Potassium: 4.4 mmol/L (ref 3.5–5.1)
Potassium: 4.1 mmol/L (ref 3.5–5.1)
Potassium: 4.1 mmol/L (ref 3.5–5.1)
Potassium: 4.6 mmol/L (ref 3.5–5.1)
Potassium: 4.9 mmol/L (ref 3.5–5.1)
Potassium: 4.1 mmol/L (ref 3.5–5.1)
Potassium: 4.3 mmol/L (ref 3.5–5.1)
Sodium: 142 mmol/L (ref 135–145)
Sodium: 141 mmol/L (ref 135–145)
Sodium: 142 mmol/L (ref 135–145)
Sodium: 140 mmol/L (ref 135–145)
Sodium: 140 mmol/L (ref 135–145)
Sodium: 141 mmol/L (ref 135–145)
Sodium: 142 mmol/L (ref 135–145)
TCO2: 24 mmol/L (ref 22–32)
TCO2: 24 mmol/L (ref 22–32)
TCO2: 26 mmol/L (ref 22–32)
TCO2: 26 mmol/L (ref 22–32)
TCO2: 25 mmol/L (ref 22–32)
TCO2: 24 mmol/L (ref 22–32)
TCO2: 21 mmol/L — ABNORMAL LOW (ref 22–32)

## 2018-01-18 LAB — POCT I-STAT 3, ART BLOOD GAS (G3+)
Acid-base deficit: 2 mmol/L (ref 0.0–2.0)
Acid-base deficit: 2 mmol/L (ref 0.0–2.0)
Acid-base deficit: 3 mmol/L — ABNORMAL HIGH (ref 0.0–2.0)
Acid-base deficit: 5 mmol/L — ABNORMAL HIGH (ref 0.0–2.0)
Acid-base deficit: 5 mmol/L — ABNORMAL HIGH (ref 0.0–2.0)
Bicarbonate: 25.4 mmol/L (ref 20.0–28.0)
Bicarbonate: 23.5 mmol/L (ref 20.0–28.0)
Bicarbonate: 23.7 mmol/L (ref 20.0–28.0)
Bicarbonate: 22.3 mmol/L (ref 20.0–28.0)
Bicarbonate: 20.8 mmol/L (ref 20.0–28.0)
Bicarbonate: 20.9 mmol/L (ref 20.0–28.0)
O2 Saturation: 100 %
O2 Saturation: 100 %
O2 Saturation: 99 %
O2 Saturation: 97 %
O2 Saturation: 93 %
O2 Saturation: 95 %
Patient temperature: 35.6
Patient temperature: 36.7
Patient temperature: 36.7
Patient temperature: 36.8
TCO2: 27 mmol/L (ref 22–32)
TCO2: 25 mmol/L (ref 22–32)
TCO2: 25 mmol/L (ref 22–32)
TCO2: 23 mmol/L (ref 22–32)
TCO2: 22 mmol/L (ref 22–32)
TCO2: 22 mmol/L (ref 22–32)
pCO2 arterial: 43.3 mmHg (ref 32.0–48.0)
pCO2 arterial: 43.3 mmHg (ref 32.0–48.0)
pCO2 arterial: 40.9 mmHg (ref 32.0–48.0)
pCO2 arterial: 38.9 mmHg (ref 32.0–48.0)
pCO2 arterial: 40.6 mmHg (ref 32.0–48.0)
pCO2 arterial: 39.4 mmHg (ref 32.0–48.0)
pH, Arterial: 7.377 (ref 7.350–7.450)
pH, Arterial: 7.342 — ABNORMAL LOW (ref 7.350–7.450)
pH, Arterial: 7.364 (ref 7.350–7.450)
pH, Arterial: 7.365 (ref 7.350–7.450)
pH, Arterial: 7.315 — ABNORMAL LOW (ref 7.350–7.450)
pH, Arterial: 7.332 — ABNORMAL LOW (ref 7.350–7.450)
pO2, Arterial: 387 mmHg — ABNORMAL HIGH (ref 83.0–108.0)
pO2, Arterial: 255 mmHg — ABNORMAL HIGH (ref 83.0–108.0)
pO2, Arterial: 116 mmHg — ABNORMAL HIGH (ref 83.0–108.0)
pO2, Arterial: 91 mmHg (ref 83.0–108.0)
pO2, Arterial: 71 mmHg — ABNORMAL LOW (ref 83.0–108.0)
pO2, Arterial: 80 mmHg — ABNORMAL LOW (ref 83.0–108.0)

## 2018-01-18 LAB — CBC
HCT: 28.3 % — ABNORMAL LOW (ref 39.0–52.0)
HCT: 30.4 % — ABNORMAL LOW (ref 39.0–52.0)
HEMOGLOBIN: 9 g/dL — AB (ref 13.0–17.0)
Hemoglobin: 9.7 g/dL — ABNORMAL LOW (ref 13.0–17.0)
MCH: 29.2 pg (ref 26.0–34.0)
MCH: 29.3 pg (ref 26.0–34.0)
MCHC: 31.8 g/dL (ref 30.0–36.0)
MCHC: 31.9 g/dL (ref 30.0–36.0)
MCV: 91.9 fL (ref 78.0–100.0)
MCV: 91.8 fL (ref 78.0–100.0)
PLATELETS: 126 10*3/uL — AB (ref 150–400)
Platelets: 145 10*3/uL — ABNORMAL LOW (ref 150–400)
RBC: 3.08 MIL/uL — AB (ref 4.22–5.81)
RBC: 3.31 MIL/uL — ABNORMAL LOW (ref 4.22–5.81)
RDW: 14.3 % (ref 11.5–15.5)
RDW: 14.1 % (ref 11.5–15.5)
WBC: 9.1 10*3/uL (ref 4.0–10.5)
WBC: 9.5 10*3/uL (ref 4.0–10.5)

## 2018-01-18 LAB — MAGNESIUM: Magnesium: 2.9 mg/dL — ABNORMAL HIGH (ref 1.7–2.4)

## 2018-01-18 LAB — CREATININE, SERUM
Creatinine, Ser: 1.63 mg/dL — ABNORMAL HIGH (ref 0.61–1.24)
GFR calc Af Amer: 46 mL/min — ABNORMAL LOW (ref >60)
GFR calc non Af Amer: 40 mL/min — ABNORMAL LOW (ref >60)

## 2018-01-18 LAB — GLUCOSE, CAPILLARY
Glucose-Capillary: 147 mg/dL — ABNORMAL HIGH (ref 65–99)
Glucose-Capillary: 99 mg/dL (ref 65–99)
Glucose-Capillary: 105 mg/dL — ABNORMAL HIGH (ref 65–99)
Glucose-Capillary: 102 mg/dL — ABNORMAL HIGH (ref 65–99)
Glucose-Capillary: 112 mg/dL — ABNORMAL HIGH (ref 65–99)
Glucose-Capillary: 138 mg/dL — ABNORMAL HIGH (ref 65–99)
Glucose-Capillary: 159 mg/dL — ABNORMAL HIGH (ref 65–99)
Glucose-Capillary: 142 mg/dL — ABNORMAL HIGH (ref 65–99)
Glucose-Capillary: 128 mg/dL — ABNORMAL HIGH (ref 65–99)

## 2018-01-18 LAB — PROTIME-INR
INR: 1.2
PROTHROMBIN TIME: 15.1 s (ref 11.4–15.2)

## 2018-01-18 LAB — HEMOGLOBIN AND HEMATOCRIT, BLOOD
HCT: 27.4 % — ABNORMAL LOW (ref 39.0–52.0)
Hemoglobin: 9 g/dL — ABNORMAL LOW (ref 13.0–17.0)

## 2018-01-18 LAB — PLATELET COUNT: Platelets: 160 10*3/uL (ref 150–400)

## 2018-01-18 LAB — APTT: APTT: 35 s (ref 24–36)

## 2018-01-18 SURGERY — CORONARY ARTERY BYPASS GRAFTING (CABG)
Anesthesia: General | Site: Chest

## 2018-01-18 MED ORDER — HEPARIN SODIUM (PORCINE) 1000 UNIT/ML IJ SOLN
INTRAMUSCULAR | Status: AC
  Filled 2018-01-18: qty 1

## 2018-01-18 MED ORDER — FAMOTIDINE IN NACL 20-0.9 MG/50ML-% IV SOLN
20.0000 mg | Freq: Two times a day (BID) | INTRAVENOUS | Status: AC
  Administered 2018-01-18: 20 mg via INTRAVENOUS

## 2018-01-18 MED ORDER — LEVALBUTEROL HCL 1.25 MG/0.5ML IN NEBU: 1.2500 mg | INHALATION_SOLUTION | Freq: Four times a day (QID) | RESPIRATORY_TRACT | Status: DC | PRN

## 2018-01-18 MED ORDER — PHENYLEPHRINE HCL 10 MG/ML IJ SOLN
INTRAMUSCULAR | Status: DC | PRN
  Administered 2018-01-18: 40 ug via INTRAVENOUS
  Administered 2018-01-18: 120 ug via INTRAVENOUS

## 2018-01-18 MED ORDER — MILRINONE LACTATE IN DEXTROSE 20-5 MG/100ML-% IV SOLN
0.1250 ug/kg/min | INTRAVENOUS | Status: DC
  Administered 2018-01-19 (×3): 0.25 ug/kg/min via INTRAVENOUS
  Administered 2018-01-20: 0.125 ug/kg/min via INTRAVENOUS
  Filled 2018-01-18 (×4): qty 100

## 2018-01-18 MED ORDER — SODIUM BICARBONATE 8.4 % IV SOLN
50.0000 meq | Freq: Once | INTRAVENOUS | Status: AC
  Administered 2018-01-18: 50 meq via INTRAVENOUS

## 2018-01-18 MED ORDER — LACTATED RINGERS IV SOLN: INTRAVENOUS | Status: DC

## 2018-01-18 MED ORDER — FENTANYL CITRATE (PF) 250 MCG/5ML IJ SOLN
INTRAMUSCULAR | Status: DC | PRN
  Administered 2018-01-18 (×3): 50 ug via INTRAVENOUS
  Administered 2018-01-18 (×3): 100 ug via INTRAVENOUS
  Administered 2018-01-18 (×2): 50 ug via INTRAVENOUS
  Administered 2018-01-18: 100 ug via INTRAVENOUS
  Administered 2018-01-18: 150 ug via INTRAVENOUS
  Administered 2018-01-18 (×3): 100 ug via INTRAVENOUS
  Administered 2018-01-18 (×3): 50 ug via INTRAVENOUS
  Administered 2018-01-18 (×2): 100 ug via INTRAVENOUS
  Administered 2018-01-18: 200 ug via INTRAVENOUS
  Administered 2018-01-18: 100 ug via INTRAVENOUS

## 2018-01-18 MED ORDER — ONDANSETRON HCL 4 MG/2ML IJ SOLN
4.0000 mg | Freq: Four times a day (QID) | INTRAMUSCULAR | Status: DC | PRN
  Administered 2018-01-19 (×3): 4 mg via INTRAVENOUS
  Filled 2018-01-18 (×3): qty 2

## 2018-01-18 MED ORDER — SIMVASTATIN 20 MG PO TABS
20.0000 mg | ORAL_TABLET | Freq: Every day | ORAL | Status: DC
  Administered 2018-01-19 – 2018-01-27 (×9): 20 mg via ORAL
  Filled 2018-01-18 (×9): qty 1

## 2018-01-18 MED ORDER — METOPROLOL TARTRATE 12.5 MG HALF TABLET: 12.5000 mg | ORAL_TABLET | Freq: Once | ORAL | Status: DC

## 2018-01-18 MED ORDER — ROCURONIUM BROMIDE 10 MG/ML (PF) SYRINGE
PREFILLED_SYRINGE | INTRAVENOUS | Status: DC | PRN
  Administered 2018-01-18: 50 mg via INTRAVENOUS
  Administered 2018-01-18: 100 mg via INTRAVENOUS
  Administered 2018-01-18 (×3): 50 mg via INTRAVENOUS

## 2018-01-18 MED ORDER — MILRINONE LACTATE IN DEXTROSE 20-5 MG/100ML-% IV SOLN: 0.2500 ug/kg/min | INTRAVENOUS | Status: DC

## 2018-01-18 MED ORDER — MILRINONE LACTATE IN DEXTROSE 20-5 MG/100ML-% IV SOLN
0.2500 ug/kg/min | INTRAVENOUS | Status: AC
  Administered 2018-01-18: 0.25 ug/kg/min via INTRAVENOUS
  Filled 2018-01-18: qty 100

## 2018-01-18 MED ORDER — LACTATED RINGERS IV SOLN: 500.0000 mL | Freq: Once | INTRAVENOUS | Status: DC | PRN

## 2018-01-18 MED ORDER — SODIUM CHLORIDE 0.9 % IV SOLN
1.5000 g | Freq: Two times a day (BID) | INTRAVENOUS | Status: AC
  Administered 2018-01-18 – 2018-01-20 (×4): 1.5 g via INTRAVENOUS
  Filled 2018-01-18 (×4): qty 1.5

## 2018-01-18 MED ORDER — HEPARIN SODIUM (PORCINE) 1000 UNIT/ML IJ SOLN
INTRAMUSCULAR | Status: DC | PRN
  Administered 2018-01-18: 42000 [IU] via INTRAVENOUS
  Administered 2018-01-18: 3000 [IU] via INTRAVENOUS

## 2018-01-18 MED ORDER — NITROGLYCERIN IN D5W 200-5 MCG/ML-% IV SOLN: 0.0000 ug/min | INTRAVENOUS | Status: DC

## 2018-01-18 MED ORDER — OXYCODONE HCL 5 MG PO TABS
5.0000 mg | ORAL_TABLET | ORAL | Status: DC | PRN
  Administered 2018-01-18 – 2018-01-19 (×4): 10 mg via ORAL
  Administered 2018-01-19: 5 mg via ORAL
  Administered 2018-01-19 – 2018-01-20 (×3): 10 mg via ORAL
  Administered 2018-01-21: 5 mg via ORAL
  Filled 2018-01-18: qty 1
  Filled 2018-01-18 (×6): qty 2
  Filled 2018-01-18: qty 1
  Filled 2018-01-18: qty 2

## 2018-01-18 MED ORDER — SODIUM CHLORIDE 0.9 % IV SOLN: 250.0000 mL | INTRAVENOUS | Status: DC

## 2018-01-18 MED ORDER — CLOPIDOGREL BISULFATE 75 MG PO TABS
75.0000 mg | ORAL_TABLET | Freq: Every day | ORAL | Status: DC
  Administered 2018-01-19 – 2018-01-28 (×10): 75 mg via ORAL
  Filled 2018-01-18 (×10): qty 1

## 2018-01-18 MED ORDER — LACTATED RINGERS IV SOLN
INTRAVENOUS | Status: DC | PRN
Start: 2018-01-18 — End: 2018-01-18
  Administered 2018-01-18: 07:00:00 via INTRAVENOUS

## 2018-01-18 MED ORDER — HEPARIN SODIUM (PORCINE) 1000 UNIT/ML IJ SOLN
INTRAMUSCULAR | Status: AC
  Filled 2018-01-18: qty 2

## 2018-01-18 MED ORDER — ALBUMIN HUMAN 5 % IV SOLN
250.0000 mL | INTRAVENOUS | Status: DC | PRN
  Filled 2018-01-18: qty 250

## 2018-01-18 MED ORDER — MIDAZOLAM HCL 10 MG/2ML IJ SOLN
INTRAMUSCULAR | Status: AC
Start: 2018-01-18 — End: ?
  Filled 2018-01-18: qty 2

## 2018-01-18 MED ORDER — CHLORHEXIDINE GLUCONATE 4 % EX LIQD: 30.0000 mL | CUTANEOUS | Status: DC

## 2018-01-18 MED ORDER — MAGNESIUM SULFATE 4 GM/100ML IV SOLN
4.0000 g | Freq: Once | INTRAVENOUS | Status: AC
  Administered 2018-01-18: 4 g via INTRAVENOUS
  Filled 2018-01-18 (×2): qty 100

## 2018-01-18 MED ORDER — ASPIRIN EC 81 MG PO TBEC
81.0000 mg | DELAYED_RELEASE_TABLET | Freq: Every day | ORAL | Status: DC
  Administered 2018-01-19 – 2018-01-28 (×10): 81 mg via ORAL
  Filled 2018-01-18 (×10): qty 1

## 2018-01-18 MED ORDER — 0.9 % SODIUM CHLORIDE (POUR BTL) OPTIME
TOPICAL | Status: DC | PRN
  Administered 2018-01-18 (×7): 1000 mL

## 2018-01-18 MED ORDER — FENTANYL CITRATE (PF) 250 MCG/5ML IJ SOLN
INTRAMUSCULAR | Status: AC
  Filled 2018-01-18: qty 5

## 2018-01-18 MED ORDER — LACTATED RINGERS IV SOLN
INTRAVENOUS | Status: DC
  Administered 2018-01-18: 16:00:00 via INTRAVENOUS

## 2018-01-18 MED ORDER — SODIUM CHLORIDE 0.9 % IV SOLN
INTRAVENOUS | Status: DC | PRN
  Administered 2018-01-18: 14:00:00 via INTRAVENOUS

## 2018-01-18 MED ORDER — ALBUMIN HUMAN 5 % IV SOLN
INTRAVENOUS | Status: DC | PRN
  Administered 2018-01-18 (×2): via INTRAVENOUS

## 2018-01-18 MED ORDER — DEXMEDETOMIDINE HCL IN NACL 200 MCG/50ML IV SOLN
0.0000 ug/kg/h | INTRAVENOUS | Status: DC
Start: 2018-01-18 — End: 2018-01-20
  Administered 2018-01-18: 0.5 ug/kg/h via INTRAVENOUS
  Administered 2018-01-18: 0.7 ug/kg/h via INTRAVENOUS
  Administered 2018-01-19: 0.2 ug/kg/h via INTRAVENOUS
  Filled 2018-01-18 (×2): qty 50

## 2018-01-18 MED ORDER — VANCOMYCIN HCL IN DEXTROSE 1-5 GM/200ML-% IV SOLN: 1000.0000 mg | Freq: Once | INTRAVENOUS | Status: DC

## 2018-01-18 MED ORDER — MORPHINE SULFATE (PF) 2 MG/ML IV SOLN
1.0000 mg | INTRAVENOUS | Status: AC | PRN
  Administered 2018-01-18 (×2): 4 mg via INTRAVENOUS
  Filled 2018-01-18 (×2): qty 2

## 2018-01-18 MED ORDER — SODIUM CHLORIDE 0.9 % IJ SOLN
OROMUCOSAL | Status: DC | PRN
  Administered 2018-01-18 (×3): 4 mL via TOPICAL

## 2018-01-18 MED ORDER — METOCLOPRAMIDE HCL 5 MG/ML IJ SOLN
10.0000 mg | Freq: Four times a day (QID) | INTRAMUSCULAR | Status: DC
  Administered 2018-01-18 – 2018-01-24 (×22): 10 mg via INTRAVENOUS
  Filled 2018-01-18 (×21): qty 2

## 2018-01-18 MED ORDER — PROTAMINE SULFATE 10 MG/ML IV SOLN
INTRAVENOUS | Status: AC
  Filled 2018-01-18: qty 25

## 2018-01-18 MED ORDER — ACETAMINOPHEN 160 MG/5ML PO SOLN: 1000.0000 mg | Freq: Four times a day (QID) | ORAL | Status: DC

## 2018-01-18 MED ORDER — PANTOPRAZOLE SODIUM 40 MG PO TBEC
40.0000 mg | DELAYED_RELEASE_TABLET | Freq: Every day | ORAL | Status: DC
Start: 2018-01-20 — End: 2018-01-27
  Administered 2018-01-20 – 2018-01-26 (×7): 40 mg via ORAL
  Filled 2018-01-18 (×7): qty 1

## 2018-01-18 MED ORDER — PROTAMINE SULFATE 10 MG/ML IV SOLN
INTRAVENOUS | Status: DC | PRN
  Administered 2018-01-18: 10 mg via INTRAVENOUS
  Administered 2018-01-18: 390 mg via INTRAVENOUS

## 2018-01-18 MED ORDER — SODIUM CHLORIDE 0.9 % IV SOLN
INTRAVENOUS | Status: DC
  Administered 2018-01-18 – 2018-01-19 (×2): via INTRAVENOUS

## 2018-01-18 MED ORDER — SODIUM CHLORIDE 0.9% FLUSH
3.0000 mL | Freq: Two times a day (BID) | INTRAVENOUS | Status: DC
  Administered 2018-01-19 – 2018-01-20 (×3): 3 mL via INTRAVENOUS
  Administered 2018-01-20: 09:00:00 via INTRAVENOUS
  Administered 2018-01-21 – 2018-01-24 (×2): 3 mL via INTRAVENOUS
  Administered 2018-01-24: 10 mL via INTRAVENOUS
  Administered 2018-01-25 – 2018-01-26 (×2): 3 mL via INTRAVENOUS

## 2018-01-18 MED ORDER — MORPHINE SULFATE (PF) 2 MG/ML IV SOLN
2.0000 mg | INTRAVENOUS | Status: DC | PRN
  Administered 2018-01-20: 2 mg via INTRAVENOUS
  Filled 2018-01-18: qty 2
  Filled 2018-01-18: qty 1

## 2018-01-18 MED ORDER — PROPOFOL 10 MG/ML IV BOLUS
INTRAVENOUS | Status: AC
  Filled 2018-01-18: qty 20

## 2018-01-18 MED ORDER — LACTATED RINGERS IV SOLN
INTRAVENOUS | Status: DC | PRN
  Administered 2018-01-18: 07:00:00 via INTRAVENOUS

## 2018-01-18 MED ORDER — INSULIN REGULAR BOLUS VIA INFUSION
0.0000 [IU] | Freq: Three times a day (TID) | INTRAVENOUS | Status: DC
  Filled 2018-01-18: qty 10

## 2018-01-18 MED ORDER — ORAL CARE MOUTH RINSE: 15.0000 mL | Freq: Two times a day (BID) | OROMUCOSAL | Status: DC

## 2018-01-18 MED ORDER — MIDAZOLAM HCL 2 MG/2ML IJ SOLN
2.0000 mg | INTRAMUSCULAR | Status: DC | PRN
  Administered 2018-01-18: 2 mg via INTRAVENOUS
  Filled 2018-01-18: qty 2

## 2018-01-18 MED ORDER — CHLORHEXIDINE GLUCONATE 0.12 % MT SOLN
15.0000 mL | OROMUCOSAL | Status: AC
  Administered 2018-01-18: 15 mL via OROMUCOSAL

## 2018-01-18 MED ORDER — PHENYLEPHRINE HCL 10 MG/ML IJ SOLN
INTRAVENOUS | Status: DC | PRN
  Administered 2018-01-18: 30 ug/min via INTRAVENOUS

## 2018-01-18 MED ORDER — SODIUM CHLORIDE 0.9% FLUSH: 3.0000 mL | INTRAVENOUS | Status: DC | PRN

## 2018-01-18 MED ORDER — ROCURONIUM BROMIDE 50 MG/5ML IV SOLN
INTRAVENOUS | Status: AC
  Filled 2018-01-18: qty 2

## 2018-01-18 MED ORDER — METOPROLOL TARTRATE 12.5 MG HALF TABLET
12.5000 mg | ORAL_TABLET | Freq: Two times a day (BID) | ORAL | Status: DC
Start: 2018-01-18 — End: 2018-01-27
  Administered 2018-01-18 – 2018-01-26 (×17): 12.5 mg via ORAL
  Filled 2018-01-18 (×17): qty 1

## 2018-01-18 MED ORDER — BISACODYL 5 MG PO TBEC
10.0000 mg | DELAYED_RELEASE_TABLET | Freq: Every day | ORAL | Status: DC
  Administered 2018-01-19 – 2018-01-26 (×7): 10 mg via ORAL
  Filled 2018-01-18 (×7): qty 2

## 2018-01-18 MED ORDER — PHENYLEPHRINE HCL 10 MG/ML IJ SOLN: INTRAMUSCULAR | Status: DC | PRN

## 2018-01-18 MED ORDER — ACETAMINOPHEN 160 MG/5ML PO SOLN: 650.0000 mg | Freq: Once | ORAL | Status: AC

## 2018-01-18 MED ORDER — POTASSIUM CHLORIDE 10 MEQ/50ML IV SOLN
10.0000 meq | INTRAVENOUS | Status: AC
  Administered 2018-01-18 (×2): 10 meq via INTRAVENOUS
  Filled 2018-01-18: qty 50

## 2018-01-18 MED ORDER — DOXAZOSIN MESYLATE 2 MG PO TABS
2.0000 mg | ORAL_TABLET | Freq: Every day | ORAL | Status: DC
  Administered 2018-01-19 – 2018-01-21 (×3): 2 mg via ORAL
  Filled 2018-01-18 (×3): qty 1

## 2018-01-18 MED ORDER — ACETAMINOPHEN 500 MG PO TABS
1000.0000 mg | ORAL_TABLET | Freq: Four times a day (QID) | ORAL | Status: DC
  Administered 2018-01-18 – 2018-01-23 (×17): 1000 mg via ORAL
  Filled 2018-01-18 (×17): qty 2

## 2018-01-18 MED ORDER — TRAMADOL HCL 50 MG PO TABS
50.0000 mg | ORAL_TABLET | ORAL | Status: DC | PRN
  Administered 2018-01-20: 100 mg via ORAL
  Filled 2018-01-18: qty 2

## 2018-01-18 MED ORDER — PROPOFOL 10 MG/ML IV BOLUS
INTRAVENOUS | Status: DC | PRN
  Administered 2018-01-18: 150 mg via INTRAVENOUS

## 2018-01-18 MED ORDER — METOPROLOL TARTRATE 5 MG/5ML IV SOLN: 2.5000 mg | INTRAVENOUS | Status: DC | PRN

## 2018-01-18 MED ORDER — ALBUMIN HUMAN 5 % IV SOLN
250.0000 mL | INTRAVENOUS | Status: AC | PRN
  Administered 2018-01-18 (×4): 250 mL via INTRAVENOUS
  Filled 2018-01-18 (×2): qty 250

## 2018-01-18 MED ORDER — BISACODYL 10 MG RE SUPP: 10.0000 mg | Freq: Every day | RECTAL | Status: DC

## 2018-01-18 MED ORDER — METOCLOPRAMIDE HCL 5 MG/ML IJ SOLN: 10.0000 mg | Freq: Four times a day (QID) | INTRAMUSCULAR | Status: DC

## 2018-01-18 MED ORDER — EPHEDRINE SULFATE 50 MG/ML IJ SOLN
INTRAMUSCULAR | Status: DC | PRN
  Administered 2018-01-18: 5 mg via INTRAVENOUS
  Administered 2018-01-18: 10 mg via INTRAVENOUS

## 2018-01-18 MED ORDER — CHLORHEXIDINE GLUCONATE 0.12 % MT SOLN
15.0000 mL | Freq: Two times a day (BID) | OROMUCOSAL | Status: DC
  Administered 2018-01-18: 15 mL via OROMUCOSAL
  Filled 2018-01-18: qty 15

## 2018-01-18 MED ORDER — SODIUM CHLORIDE 0.45 % IV SOLN
INTRAVENOUS | Status: DC | PRN
  Administered 2018-01-18: 15:00:00 via INTRAVENOUS

## 2018-01-18 MED ORDER — PHENYLEPHRINE HCL 10 MG/ML IJ SOLN
INTRAVENOUS | Status: DC | PRN
  Administered 2018-01-18: 20 ug/min via INTRAVENOUS

## 2018-01-18 MED ORDER — SODIUM CHLORIDE 0.9 % IV SOLN
INTRAVENOUS | Status: DC
  Administered 2018-01-19: 3.6 [IU]/h via INTRAVENOUS
  Filled 2018-01-18: qty 1

## 2018-01-18 MED ORDER — TRANEXAMIC ACID 1000 MG/10ML IV SOLN
1.5000 mg/kg/h | INTRAVENOUS | Status: DC
  Filled 2018-01-18: qty 25

## 2018-01-18 MED ORDER — ACETAMINOPHEN 650 MG RE SUPP
650.0000 mg | Freq: Once | RECTAL | Status: AC
  Administered 2018-01-18: 650 mg via RECTAL

## 2018-01-18 MED ORDER — METOPROLOL TARTRATE 25 MG/10 ML ORAL SUSPENSION: 12.5000 mg | Freq: Two times a day (BID) | ORAL | Status: DC

## 2018-01-18 MED ORDER — FENTANYL CITRATE (PF) 250 MCG/5ML IJ SOLN
INTRAMUSCULAR | Status: AC
  Filled 2018-01-18: qty 25

## 2018-01-18 MED ORDER — HEMOSTATIC AGENTS (NO CHARGE) OPTIME
TOPICAL | Status: DC | PRN
  Administered 2018-01-18 (×3): 1 via TOPICAL

## 2018-01-18 MED ORDER — ROCURONIUM BROMIDE 50 MG/5ML IV SOLN
INTRAVENOUS | Status: AC
  Filled 2018-01-18: qty 4

## 2018-01-18 MED ORDER — MIDAZOLAM HCL 5 MG/5ML IJ SOLN
INTRAMUSCULAR | Status: DC | PRN
  Administered 2018-01-18: 1 mg via INTRAVENOUS
  Administered 2018-01-18: 4 mg via INTRAVENOUS
  Administered 2018-01-18: 2 mg via INTRAVENOUS
  Administered 2018-01-18: 1 mg via INTRAVENOUS
  Administered 2018-01-18: 2 mg via INTRAVENOUS

## 2018-01-18 MED ORDER — CHLORHEXIDINE GLUCONATE 0.12 % MT SOLN
15.0000 mL | Freq: Once | OROMUCOSAL | Status: AC
  Administered 2018-01-18: 15 mL via OROMUCOSAL
  Filled 2018-01-18: qty 15

## 2018-01-18 MED ORDER — DOPAMINE-DEXTROSE 3.2-5 MG/ML-% IV SOLN: 2.5000 ug/kg/min | INTRAVENOUS | Status: DC

## 2018-01-18 MED ORDER — ASPIRIN EC 325 MG PO TBEC: 325.0000 mg | DELAYED_RELEASE_TABLET | Freq: Every day | ORAL | Status: DC

## 2018-01-18 MED ORDER — SODIUM CHLORIDE 0.9 % IV SOLN
0.0000 ug/min | INTRAVENOUS | Status: DC
  Administered 2018-01-19: 10 ug/min via INTRAVENOUS
  Filled 2018-01-18: qty 20

## 2018-01-18 MED ORDER — ASPIRIN 81 MG PO CHEW: 324.0000 mg | CHEWABLE_TABLET | Freq: Every day | ORAL | Status: DC

## 2018-01-18 MED ORDER — DOCUSATE SODIUM 100 MG PO CAPS
200.0000 mg | ORAL_CAPSULE | Freq: Every day | ORAL | Status: DC
  Administered 2018-01-19 – 2018-01-26 (×8): 200 mg via ORAL
  Filled 2018-01-18 (×8): qty 2

## 2018-01-18 MED ORDER — SUCCINYLCHOLINE CHLORIDE 20 MG/ML IJ SOLN
INTRAMUSCULAR | Status: DC | PRN
  Administered 2018-01-18: 140 mg via INTRAVENOUS

## 2018-01-18 MED ORDER — VANCOMYCIN HCL IN DEXTROSE 1-5 GM/200ML-% IV SOLN
1000.0000 mg | Freq: Two times a day (BID) | INTRAVENOUS | Status: AC
  Administered 2018-01-18 – 2018-01-19 (×3): 1000 mg via INTRAVENOUS
  Filled 2018-01-18 (×3): qty 200

## 2018-01-18 SURGICAL SUPPLY — 114 items
ADAPTER CARDIO PERF ANTE/RETRO (ADAPTER) ×4 IMPLANT
BAG DECANTER FOR FLEXI CONT (MISCELLANEOUS) ×4 IMPLANT
BANDAGE ACE 4X5 VEL STRL LF (GAUZE/BANDAGES/DRESSINGS) ×4 IMPLANT
BANDAGE ACE 6X5 VEL STRL LF (GAUZE/BANDAGES/DRESSINGS) ×4 IMPLANT
BASKET HEART  (ORDER IN 25'S) (MISCELLANEOUS) ×1
BASKET HEART (ORDER IN 25'S) (MISCELLANEOUS) ×1
BASKET HEART (ORDER IN 25S) (MISCELLANEOUS) ×2 IMPLANT
BLADE CLIPPER SURG (BLADE) ×8 IMPLANT
BLADE NEEDLE 3 SS STRL (BLADE) ×3 IMPLANT
BLADE NEEDLE 3MM SS STRL (BLADE) ×1
BLADE STERNUM SYSTEM 6 (BLADE) ×4 IMPLANT
BLADE SURG 11 STRL SS (BLADE) ×4 IMPLANT
BLADE SURG 12 STRL SS (BLADE) ×4 IMPLANT
BNDG GAUZE ELAST 4 BULKY (GAUZE/BANDAGES/DRESSINGS) ×4 IMPLANT
CANISTER SUCT 3000ML PPV (MISCELLANEOUS) ×4 IMPLANT
CANNULA ARTERIAL NVNT 3/8 22FR (MISCELLANEOUS) ×4 IMPLANT
CANNULA GUNDRY RCSP 15FR (MISCELLANEOUS) ×4 IMPLANT
CATH CPB KIT VANTRIGT (MISCELLANEOUS) ×4 IMPLANT
CATH ROBINSON RED A/P 18FR (CATHETERS) ×12 IMPLANT
CATH THORACIC 36FR RT ANG (CATHETERS) ×4 IMPLANT
CLIP RETRACTION 3.0MM CORONARY (MISCELLANEOUS) ×4 IMPLANT
CLIP VESOCCLUDE SM WIDE 24/CT (CLIP) ×4 IMPLANT
CONT SPEC 4OZ CLIKSEAL STRL BL (MISCELLANEOUS) ×4 IMPLANT
COVER SURGICAL LIGHT HANDLE (MISCELLANEOUS) ×4 IMPLANT
CRADLE DONUT ADULT HEAD (MISCELLANEOUS) ×4 IMPLANT
DRAIN CHANNEL 32F RND 10.7 FF (WOUND CARE) ×4 IMPLANT
DRAPE CARDIOVASCULAR INCISE (DRAPES) ×2
DRAPE SLUSH/WARMER DISC (DRAPES) ×4 IMPLANT
DRAPE SRG 135X102X78XABS (DRAPES) ×2 IMPLANT
DRSG AQUACEL AG ADV 3.5X14 (GAUZE/BANDAGES/DRESSINGS) ×4 IMPLANT
ELECT BLADE 4.0 EZ CLEAN MEGAD (MISCELLANEOUS) ×4
ELECT BLADE 6.5 EXT (BLADE) ×4 IMPLANT
ELECT CAUTERY BLADE 6.4 (BLADE) ×8 IMPLANT
ELECT REM PT RETURN 9FT ADLT (ELECTROSURGICAL) ×8
ELECTRODE BLDE 4.0 EZ CLN MEGD (MISCELLANEOUS) ×2 IMPLANT
ELECTRODE REM PT RTRN 9FT ADLT (ELECTROSURGICAL) ×4 IMPLANT
FELT TEFLON 1X6 (MISCELLANEOUS) ×8 IMPLANT
FLOSEAL 10ML (HEMOSTASIS) ×4 IMPLANT
FLOSEAL 5ML (HEMOSTASIS) IMPLANT
GAUZE SPONGE 4X4 12PLY STRL (GAUZE/BANDAGES/DRESSINGS) ×4 IMPLANT
GAUZE SPONGE 4X4 12PLY STRL LF (GAUZE/BANDAGES/DRESSINGS) ×4 IMPLANT
GLOVE BIO SURGEON STRL SZ 6 (GLOVE) ×4 IMPLANT
GLOVE BIO SURGEON STRL SZ 6.5 (GLOVE) ×12 IMPLANT
GLOVE BIO SURGEON STRL SZ7.5 (GLOVE) ×12 IMPLANT
GLOVE BIO SURGEON STRL SZ8.5 (GLOVE) ×4 IMPLANT
GLOVE BIO SURGEONS STRL SZ 6.5 (GLOVE) ×4
GLOVE BIOGEL PI IND STRL 7.5 (GLOVE) ×2 IMPLANT
GLOVE BIOGEL PI IND STRL 8.5 (GLOVE) ×8 IMPLANT
GLOVE BIOGEL PI INDICATOR 7.5 (GLOVE) ×2
GLOVE BIOGEL PI INDICATOR 8.5 (GLOVE) ×8
GLOVE ECLIPSE 8.0 STRL XLNG CF (GLOVE) ×4 IMPLANT
GLOVE SURG SS PI 6.0 STRL IVOR (GLOVE) ×8 IMPLANT
GOWN STRL REUS W/ TWL LRG LVL3 (GOWN DISPOSABLE) ×18 IMPLANT
GOWN STRL REUS W/TWL 2XL LVL3 (GOWN DISPOSABLE) ×4 IMPLANT
GOWN STRL REUS W/TWL LRG LVL3 (GOWN DISPOSABLE) ×18
HEMOSTAT POWDER SURGIFOAM 1G (HEMOSTASIS) ×12 IMPLANT
HEMOSTAT SURGICEL 2X14 (HEMOSTASIS) ×4 IMPLANT
INSERT FOGARTY XLG (MISCELLANEOUS) ×4 IMPLANT
KIT BASIN OR (CUSTOM PROCEDURE TRAY) ×4 IMPLANT
KIT SUCTION CATH 14FR (SUCTIONS) ×4 IMPLANT
KIT TURNOVER KIT B (KITS) ×4 IMPLANT
KIT VASOVIEW HEMOPRO VH 3000 (KITS) ×4 IMPLANT
LEAD PACING MYOCARDI (MISCELLANEOUS) ×4 IMPLANT
MARKER GRAFT CORONARY BYPASS (MISCELLANEOUS) ×12 IMPLANT
NS IRRIG 1000ML POUR BTL (IV SOLUTION) ×24 IMPLANT
PACK E OPEN HEART (SUTURE) ×4 IMPLANT
PACK OPEN HEART (CUSTOM PROCEDURE TRAY) ×4 IMPLANT
PAD ARMBOARD 7.5X6 YLW CONV (MISCELLANEOUS) ×12 IMPLANT
PAD ELECT DEFIB RADIOL ZOLL (MISCELLANEOUS) ×4 IMPLANT
PENCIL BUTTON HOLSTER BLD 10FT (ELECTRODE) ×8 IMPLANT
POWDER SURGICEL 3.0 GRAM (HEMOSTASIS) ×4 IMPLANT
PUNCH AORTIC ROTATE 4.0MM (MISCELLANEOUS) IMPLANT
PUNCH AORTIC ROTATE 4.5MM 8IN (MISCELLANEOUS) ×8 IMPLANT
PUNCH AORTIC ROTATE 5MM 8IN (MISCELLANEOUS) IMPLANT
SET CARDIOPLEGIA MPS 5001102 (MISCELLANEOUS) ×4 IMPLANT
SOLUTION ANTI FOG 6CC (MISCELLANEOUS) ×4 IMPLANT
SPONGE LAP 18X18 X RAY DECT (DISPOSABLE) ×8 IMPLANT
SPONGE LAP 4X18 X RAY DECT (DISPOSABLE) ×4 IMPLANT
SURGIFLO W/THROMBIN 8M KIT (HEMOSTASIS) IMPLANT
SUT BONE WAX W31G (SUTURE) ×4 IMPLANT
SUT MNCRL AB 4-0 PS2 18 (SUTURE) ×4 IMPLANT
SUT PROLENE 3 0 SH DA (SUTURE) IMPLANT
SUT PROLENE 3 0 SH1 36 (SUTURE) IMPLANT
SUT PROLENE 4 0 RB 1 (SUTURE) ×2
SUT PROLENE 4 0 SH DA (SUTURE) ×4 IMPLANT
SUT PROLENE 4-0 RB1 .5 CRCL 36 (SUTURE) ×2 IMPLANT
SUT PROLENE 5 0 C 1 36 (SUTURE) IMPLANT
SUT PROLENE 6 0 C 1 30 (SUTURE) ×8 IMPLANT
SUT PROLENE 6 0 CC (SUTURE) ×12 IMPLANT
SUT PROLENE 8 0 BV175 6 (SUTURE) ×16 IMPLANT
SUT PROLENE BLUE 7 0 (SUTURE) ×4 IMPLANT
SUT SILK  1 MH (SUTURE)
SUT SILK 1 MH (SUTURE) IMPLANT
SUT SILK 2 0 SH CR/8 (SUTURE) ×4 IMPLANT
SUT SILK 3 0 SH CR/8 (SUTURE) ×4 IMPLANT
SUT STEEL 6MS V (SUTURE) IMPLANT
SUT STEEL STERNAL CCS#1 18IN (SUTURE) ×4 IMPLANT
SUT STEEL SZ 6 DBL 3X14 BALL (SUTURE) ×8 IMPLANT
SUT VIC AB 1 CTX 36 (SUTURE) ×8
SUT VIC AB 1 CTX36XBRD ANBCTR (SUTURE) ×8 IMPLANT
SUT VIC AB 2-0 CT1 27 (SUTURE) ×2
SUT VIC AB 2-0 CT1 TAPERPNT 27 (SUTURE) ×2 IMPLANT
SUT VIC AB 2-0 CTX 27 (SUTURE) ×4 IMPLANT
SUT VIC AB 3-0 X1 27 (SUTURE) IMPLANT
SYR BULB IRRIGATION 50ML (SYRINGE) ×4 IMPLANT
SYSTEM SAHARA CHEST DRAIN ATS (WOUND CARE) ×4 IMPLANT
TAPE CLOTH SURG 4X10 WHT LF (GAUZE/BANDAGES/DRESSINGS) ×8 IMPLANT
TAPE PAPER 2X10 WHT MICROPORE (GAUZE/BANDAGES/DRESSINGS) ×4 IMPLANT
TOWEL GREEN STERILE (TOWEL DISPOSABLE) ×4 IMPLANT
TOWEL GREEN STERILE FF (TOWEL DISPOSABLE) ×8 IMPLANT
TRAY FOLEY SILVER 16FR TEMP (SET/KITS/TRAYS/PACK) ×4 IMPLANT
TUBING INSUFFLATION (TUBING) ×4 IMPLANT
UNDERPAD 30X30 (UNDERPADS AND DIAPERS) ×4 IMPLANT
WATER STERILE IRR 1000ML POUR (IV SOLUTION) ×8 IMPLANT

## 2018-01-18 NOTE — Progress Notes (Signed)
Pre Procedure note for inpatients:   [name] Young. has been scheduled for Procedure(s): CORONARY ARTERY BYPASS GRAFTING (CABG) (N/A) TRANSESOPHAGEAL ECHOCARDIOGRAM (TEE) (N/A) today. The various methods of treatment have been discussed with the patient. After consideration of the risks, benefits and treatment options the patient has consented to the planned procedure.   The patient has been seen and labs reviewed. There are no changes in the patient's condition to prevent proceeding with the planned procedure today.  Recent labs:  Lab Results  Component Value Date   WBC 6.6 01/16/2018   HGB 11.7 (L) 01/16/2018   HCT 37.0 (L) 01/16/2018   PLT 166 01/16/2018   GLUCOSE 147 (H) 01/16/2018   CHOL 117 12/10/2017   TRIG 159.0 (H) 12/10/2017   HDL 26.40 (L) 12/10/2017   LDLDIRECT 67.0 06/11/2017   LDLCALC 59 12/10/2017   ALT 22 01/16/2018   AST 28 01/16/2018   NA 141 01/16/2018   K 4.0 01/16/2018   CL 109 01/16/2018   CREATININE 1.66 (H) 01/16/2018   BUN 30 (H) 01/16/2018   CO2 21 (L) 01/16/2018   TSH 2.35 10/03/2013   PSA 0.83 11/05/2017   INR 1.04 01/16/2018   HGBA1C 6.8 (H) 01/16/2018   MICROALBUR 1.4 04/29/2014    Ivin Poot III, MD 01/18/2018 7:00 AM

## 2018-01-18 NOTE — Anesthesia Procedure Notes (Signed)
Central Venous Catheter Insertion Performed by: Duane Boston, MD, anesthesiologist Start/End5/06/2018 6:51 AM, 01/18/2018 7:01 AM Patient location: Pre-op. Preanesthetic checklist: patient identified, IV checked, site marked, risks and benefits discussed, surgical consent, monitors and equipment checked, pre-op evaluation, timeout performed and anesthesia consent Position: Trendelenburg Lidocaine 1% used for infiltration and patient sedated Hand hygiene performed , maximum sterile barriers used  and Seldinger technique used Catheter size: 8.5 Fr Total catheter length 8. PA cath was placed.Sheath introducer Swan type:thermodilution PA Cath depth:50 Procedure performed using ultrasound guided technique. Ultrasound Notes:anatomy identified, needle tip was noted to be adjacent to the nerve/plexus identified, no ultrasound evidence of intravascular and/or intraneural injection and image(s) printed for medical record Attempts: 1 Following insertion, line sutured, dressing applied and Biopatch. Post procedure assessment: free fluid flow, blood return through all ports and no air  Patient tolerated the procedure well with no immediate complications.

## 2018-01-18 NOTE — Procedures (Signed)
Extubation Procedure Note  Patient Details:   Name: [name] Young. DOB: Jan 15, 1941 MRN: [id number]   Airway Documentation:  Airway 8 mm (Active)  Secured at (cm) 23 cm 01/18/2018  6:10 PM  Measured From Lips 01/18/2018  6:10 PM  Secured Location Right 01/18/2018  6:10 PM  Secured By [employer] Tape 01/18/2018  6:10 PM  Cuff Pressure (cm H2O) 28 cm H2O 01/18/2018  2:08 PM  Site Condition Dry 01/18/2018  6:10 PM   Vent end date: (not recorded) Vent end time: (not recorded)   Evaluation  O2 sats: stable throughout Complications: No apparent complications Patient did tolerate procedure well. Bilateral Breath Sounds: Clear   Yes   Patient was extubated to a 4L [place] without any complications, dyspnea or stridor noted. Patient was instructed on IS x 5, highest goal achieved was 559mL. NIF: -20, VC: 1L.  Claretta Fraise 01/18/2018, 6:45 PM

## 2018-01-18 NOTE — Anesthesia Postprocedure Evaluation (Signed)
Anesthesia Post Note  Patient: [name] Young.  Procedure(s) Performed: CORONARY ARTERY BYPASS GRAFTING (CABG) x 3; Using Left Internal Mammary Artery, and Right Greater Saphenous Vein harvested Endoscopically, Coronary Artery Endarterectomy (N/A Chest) TRANSESOPHAGEAL ECHOCARDIOGRAM (TEE) (N/A )     Patient location during evaluation: SICU Anesthesia Type: General Level of consciousness: patient remains intubated per anesthesia plan Pain management: pain level controlled Vital Signs Assessment: post-procedure vital signs reviewed and stable Respiratory status: patient remains intubated per anesthesia plan Cardiovascular status: stable Anesthetic complications: no    Last Vitals:  Vitals:   01/18/18 0546 01/18/18 1408  BP: (!) 171/50 (!) 112/52  Pulse: 71 80  Resp: 20 12  Temp: (!) 36.4 C   SpO2: 99% 96%    Last Pain:  Vitals:   01/18/18 0558  PainSc: 0-No pain                 Barnabas Henriques

## 2018-01-18 NOTE — Anesthesia Preprocedure Evaluation (Addendum)
Anesthesia Evaluation  Patient identified by MRN, date of birth, ID band  Reviewed: Allergy & Precautions, NPO status , Patient's Chart, lab work & pertinent test results  Airway Mallampati: II  TM Distance: >3 FB     Dental   Pulmonary shortness of breath, pneumonia,    breath sounds clear to auscultation       Cardiovascular hypertension, + angina + CAD  + dysrhythmias  Rhythm:Regular Rate:Normal     Neuro/Psych    GI/Hepatic Neg liver ROS, GERD  ,  Endo/Other  diabetes  Renal/GU Renal disease     Musculoskeletal   Abdominal   Peds  Hematology   Anesthesia Other Findings   Reproductive/Obstetrics                             Anesthesia Physical Anesthesia Plan  ASA: III  Anesthesia Plan: General   Post-op Pain Management:    Induction: Intravenous  PONV Risk Score and Plan: 2 and Treatment may vary due to age or medical condition  Airway Management Planned: Oral ETT  Additional Equipment:   Intra-op Plan:   Post-operative Plan: Post-operative intubation/ventilation  Informed Consent: I have reviewed the patients History and Physical, chart, labs and discussed the procedure including the risks, benefits and alternatives for the proposed anesthesia with the patient or authorized representative who has indicated his/her understanding and acceptance.   Dental advisory given  Plan Discussed with: CRNA and Anesthesiologist  Anesthesia Plan Comments:         Anesthesia Quick Evaluation

## 2018-01-18 NOTE — Anesthesia Procedure Notes (Signed)
Arterial Line Insertion Start/End5/06/2018 7:45 AM Performed by: White, Amedeo Plenty, CRNA, CRNA  Lidocaine 1% used for infiltration Left, radial was placed Catheter size: 20 G Hand hygiene performed  and maximum sterile barriers used  Allen's test indicative of satisfactory collateral circulation Attempts: 1 Procedure performed without using ultrasound guided technique. Following insertion, Biopatch and dressing applied. Post procedure assessment: normal  Patient tolerated the procedure well with no immediate complications.

## 2018-01-18 NOTE — Progress Notes (Signed)
CT surgery p.m. Rounds  Status post multivessel CABG with coronary endarterectomy Extubated with stable hemodynamics Follow pulmonary function because of obesity and sleep apnea-ABG in a.m.

## 2018-01-18 NOTE — Brief Op Note (Signed)
01/18/2018  10:50 AM  PATIENT:  [name] Young.  77 y.o. male  PRE-OPERATIVE DIAGNOSIS:  CAD  POST-OPERATIVE DIAGNOSIS:  CAD  PROCEDURE:  Procedure(s): CORONARY ARTERY BYPASS GRAFTING (CABG) x 3; Using Left Internal Mammary Artery, and Right Greater Saphenous Vein harvested Endoscopically, Coronary Artery Endarterectomy (N/A) TRANSESOPHAGEAL ECHOCARDIOGRAM (TEE) (N/A)  SURGEON:  Surgeon(s) and Role:    Ivin Poot, MD - Primary  PHYSICIAN ASSISTANT:  Nicholes Rough, PA-C   ANESTHESIA:   general  EBL: 570ml  BLOOD ADMINISTERED:none  DRAINS: ROUTINE   LOCAL MEDICATIONS USED:  NONE  SPECIMEN:  Source of Specimen:  CORNARY ARTERY PLAQUE  DISPOSITION OF SPECIMEN:  PATHOLOGY  COUNTS:  YES  DICTATION: .Dragon Dictation  PLAN OF CARE: Admit to inpatient   PATIENT DISPOSITION:  ICU - intubated and hemodynamically stable.   Delay start of Pharmacological VTE agent (>24hrs) due to surgical blood loss or risk of bleeding: yes

## 2018-01-18 NOTE — Progress Notes (Addendum)
  Echocardiogram Echocardiogram Transesophageal has been performed.  Aneesh Faller L Androw 01/18/2018, 8:18 AM

## 2018-01-18 NOTE — Anesthesia Procedure Notes (Signed)
Procedure Name: Intubation Date/Time: 01/18/2018 7:39 AM Performed by: White, Amedeo Plenty, CRNA Pre-anesthesia Checklist: Patient identified, Emergency Drugs available, Suction available and Patient being monitored Patient Re-evaluated:Patient Re-evaluated prior to induction Oxygen Delivery Method: Circle System Utilized Preoxygenation: Pre-oxygenation with 100% oxygen Induction Type: IV induction and Rapid sequence Ventilation: Mask ventilation without difficulty Laryngoscope Size: Mac and 4 Grade View: Grade II Tube type: Oral Tube size: 8.0 mm Number of attempts: 1 Airway Equipment and Method: Stylet Placement Confirmation: ETT inserted through vocal cords under direct vision,  positive ETCO2 and breath sounds checked- equal and bilateral Secured at: 23 cm Tube secured with: Tape Dental Injury: Teeth and Oropharynx as per pre-operative assessment

## 2018-01-18 NOTE — Progress Notes (Signed)
Patient refused CPAP. Informed patient if he changed his mind to let RN know.

## 2018-01-18 NOTE — Transfer of Care (Signed)
Immediate Anesthesia Transfer of Care Note  Patient: [name] Young.  Procedure(s) Performed: CORONARY ARTERY BYPASS GRAFTING (CABG) x 3; Using Left Internal Mammary Artery, and Right Greater Saphenous Vein harvested Endoscopically, Coronary Artery Endarterectomy (N/A Chest) TRANSESOPHAGEAL ECHOCARDIOGRAM (TEE) (N/A )  Patient Location: ICU  Anesthesia Type:General  Level of Consciousness: Patient remains intubated per anesthesia plan  Airway & Oxygen Therapy: Patient remains intubated per anesthesia plan and Patient placed on Ventilator (see vital sign flow sheet for setting)  Post-op Assessment: Report given to RN and Post -op Vital signs reviewed and stable  Post vital signs: Reviewed and stable  Last Vitals:  Vitals Value Taken Time  BP    Temp    Pulse    Resp    SpO2      Last Pain:  Vitals:   01/18/18 0558  PainSc: 0-No pain      Patients Stated Pain Goal: 3 (16/83/72 9021)  Complications: No apparent anesthesia complications

## 2018-01-19 ENCOUNTER — Inpatient Hospital Stay (HOSPITAL_COMMUNITY): Payer: Medicare Other

## 2018-01-19 LAB — POCT I-STAT, CHEM 8
BUN: 24 mg/dL — ABNORMAL HIGH (ref 6–20)
Calcium, Ion: 1.11 mmol/L — ABNORMAL LOW (ref 1.15–1.40)
Chloride: 102 mmol/L (ref 101–111)
Creatinine, Ser: 1.7 mg/dL — ABNORMAL HIGH (ref 0.61–1.24)
Glucose, Bld: 182 mg/dL — ABNORMAL HIGH (ref 65–99)
HCT: 27 % — ABNORMAL LOW (ref 39.0–52.0)
Hemoglobin: 9.2 g/dL — ABNORMAL LOW (ref 13.0–17.0)
Potassium: 4.2 mmol/L (ref 3.5–5.1)
Sodium: 139 mmol/L (ref 135–145)
TCO2: 23 mmol/L (ref 22–32)

## 2018-01-19 LAB — GLUCOSE, CAPILLARY
Glucose-Capillary: 111 mg/dL — ABNORMAL HIGH (ref 65–99)
Glucose-Capillary: 102 mg/dL — ABNORMAL HIGH (ref 65–99)
Glucose-Capillary: 95 mg/dL (ref 65–99)
Glucose-Capillary: 97 mg/dL (ref 65–99)
Glucose-Capillary: 98 mg/dL (ref 65–99)
Glucose-Capillary: 118 mg/dL — ABNORMAL HIGH (ref 65–99)
Glucose-Capillary: 159 mg/dL — ABNORMAL HIGH (ref 65–99)
Glucose-Capillary: 154 mg/dL — ABNORMAL HIGH (ref 65–99)
Glucose-Capillary: 142 mg/dL — ABNORMAL HIGH (ref 65–99)
Glucose-Capillary: 191 mg/dL — ABNORMAL HIGH (ref 65–99)
Glucose-Capillary: 166 mg/dL — ABNORMAL HIGH (ref 65–99)
Glucose-Capillary: 117 mg/dL — ABNORMAL HIGH (ref 65–99)
Glucose-Capillary: 119 mg/dL — ABNORMAL HIGH (ref 65–99)
Glucose-Capillary: 165 mg/dL — ABNORMAL HIGH (ref 65–99)
Glucose-Capillary: 171 mg/dL — ABNORMAL HIGH (ref 65–99)

## 2018-01-19 LAB — POCT I-STAT 3, ART BLOOD GAS (G3+)
Acid-base deficit: 3 mmol/L — ABNORMAL HIGH (ref 0.0–2.0)
Bicarbonate: 21.8 mmol/L (ref 20.0–28.0)
O2 Saturation: 97 %
Patient temperature: 36.9
TCO2: 23 mmol/L (ref 22–32)
pCO2 arterial: 37 mmHg (ref 32.0–48.0)
pH, Arterial: 7.378 (ref 7.350–7.450)
pO2, Arterial: 97 mmHg (ref 83.0–108.0)

## 2018-01-19 LAB — BPAM FFP
Blood Product Expiration Date: 201905102359
Blood Product Expiration Date: 201905102359
ISSUE DATE / TIME: 201905101202
ISSUE DATE / TIME: 201905101202
UNIT TYPE AND RH: 6200
Unit Type and Rh: 6200

## 2018-01-19 LAB — CBC
HCT: 28.1 % — ABNORMAL LOW (ref 39.0–52.0)
HEMATOCRIT: 28.4 % — AB (ref 39.0–52.0)
HEMOGLOBIN: 8.9 g/dL — AB (ref 13.0–17.0)
Hemoglobin: 8.9 g/dL — ABNORMAL LOW (ref 13.0–17.0)
MCH: 28.6 pg (ref 26.0–34.0)
MCH: 29.3 pg (ref 26.0–34.0)
MCHC: 31.3 g/dL (ref 30.0–36.0)
MCHC: 31.7 g/dL (ref 30.0–36.0)
MCV: 91.3 fL (ref 78.0–100.0)
MCV: 92.4 fL (ref 78.0–100.0)
Platelets: 150 10*3/uL (ref 150–400)
Platelets: 141 10*3/uL — ABNORMAL LOW (ref 150–400)
RBC: 3.11 MIL/uL — ABNORMAL LOW (ref 4.22–5.81)
RBC: 3.04 MIL/uL — ABNORMAL LOW (ref 4.22–5.81)
RDW: 14.3 % (ref 11.5–15.5)
RDW: 14.5 % (ref 11.5–15.5)
WBC: 9.4 10*3/uL (ref 4.0–10.5)
WBC: 10.1 10*3/uL (ref 4.0–10.5)

## 2018-01-19 LAB — BASIC METABOLIC PANEL
ANION GAP: 7 (ref 5–15)
BUN: 23 mg/dL — ABNORMAL HIGH (ref 6–20)
CHLORIDE: 111 mmol/L (ref 101–111)
CO2: 25 mmol/L (ref 22–32)
Calcium: 8 mg/dL — ABNORMAL LOW (ref 8.9–10.3)
Creatinine, Ser: 1.59 mg/dL — ABNORMAL HIGH (ref 0.61–1.24)
GFR calc non Af Amer: 41 mL/min — ABNORMAL LOW (ref >60)
GFR, EST AFRICAN AMERICAN: 47 mL/min — AB (ref >60)
Glucose, Bld: 108 mg/dL — ABNORMAL HIGH (ref 65–99)
POTASSIUM: 4.2 mmol/L (ref 3.5–5.1)
Sodium: 143 mmol/L (ref 135–145)

## 2018-01-19 LAB — MAGNESIUM
MAGNESIUM: 2.4 mg/dL (ref 1.7–2.4)
Magnesium: 2.7 mg/dL — ABNORMAL HIGH (ref 1.7–2.4)

## 2018-01-19 LAB — PREPARE FRESH FROZEN PLASMA
Unit division: 0
Unit division: 0

## 2018-01-19 LAB — CREATININE, SERUM
Creatinine, Ser: 1.79 mg/dL — ABNORMAL HIGH (ref 0.61–1.24)
GFR calc Af Amer: 41 mL/min — ABNORMAL LOW (ref >60)
GFR, EST NON AFRICAN AMERICAN: 35 mL/min — AB (ref >60)

## 2018-01-19 MED ORDER — FUROSEMIDE 10 MG/ML IJ SOLN
40.0000 mg | Freq: Two times a day (BID) | INTRAMUSCULAR | Status: DC
Start: 2018-01-19 — End: 2018-01-21
  Administered 2018-01-19 – 2018-01-20 (×4): 40 mg via INTRAVENOUS
  Filled 2018-01-19 (×4): qty 4

## 2018-01-19 MED ORDER — INSULIN ASPART 100 UNIT/ML ~~LOC~~ SOLN
0.0000 [IU] | SUBCUTANEOUS | Status: DC
  Administered 2018-01-19 (×3): 4 [IU] via SUBCUTANEOUS
  Administered 2018-01-20 (×2): 2 [IU] via SUBCUTANEOUS
  Administered 2018-01-20 (×3): 4 [IU] via SUBCUTANEOUS
  Administered 2018-01-21: 2 [IU] via SUBCUTANEOUS

## 2018-01-19 MED ORDER — INSULIN DETEMIR 100 UNIT/ML ~~LOC~~ SOLN
14.0000 [IU] | Freq: Two times a day (BID) | SUBCUTANEOUS | Status: DC
Start: 2018-01-19 — End: 2018-01-28
  Administered 2018-01-19 – 2018-01-28 (×19): 14 [IU] via SUBCUTANEOUS
  Filled 2018-01-19 (×20): qty 0.14

## 2018-01-19 MED ORDER — ORAL CARE MOUTH RINSE
15.0000 mL | Freq: Two times a day (BID) | OROMUCOSAL | Status: DC
  Administered 2018-01-19: 15 mL via OROMUCOSAL

## 2018-01-19 NOTE — Op Note (Signed)
NAME: [name] Young, [name] Young MEDICAL RECORD KX:[id number] ACCOUNT [id number] DATE OF BIRTH:[birth year] FACILITY: [facility] LOCATION: [facility] PHYSICIAN:PETER VAN TRIGT III, MD  OPERATIVE REPORT  DATE OF PROCEDURE:  01/18/2018  PROCEDURE PERFORMED: 1.  Coronary artery bypass grafting x3 (left internal mammary artery to LAD, saphenous vein graft to diagonal, saphenous vein graft to posterolateral branch of RCA). 2.  Coronary endarterectomy of the LAD. 3.  Endoscopic harvest of right leg greater saphenous vein.  SURGEON:  Len Childs, MD  ASSISTANT:  Nicholes Rough, PA-C  ANESTHESIA:  General, Belinda Block, MD  PREOPERATIVE DIAGNOSES:  Accelerating angina, severe 3-vessel coronary artery disease, obesity, diabetes.  POSTOPERATIVE DIAGNOSES:  Accelerating angina, severe 3-vessel coronary artery disease, obesity, diabetes.  CLINICAL NOTE:  The patient is a 77 year old obese diabetic male who has had increasing symptoms of dyspnea on exertion.  Chest x-ray was unremarkable.  EKG was nonspecific.  He underwent cardiac evaluation, and cardiac catheterization was recommended  because of his multiple risk factors for coronary artery disease.  Dr. Saunders Revel performed cardiac catheterization which demonstrated chronic occlusion of the RCA with high-grade LAD diagonal stenosis and a patent main circumflex but with a diseased small  marginal branch.  EF was fairly well preserved, and the patient was referred for evaluation for coronary artery bypass surgery.  I evaluated the patient in the office after reviewing his cardiac cath and echo images.  I examined the patient and discussed  the role of CABG for treatment of his CAD.  I discussed the details of CABG at length, including the use of general anesthesia and cardiopulmonary bypass, the location of the surgical incisions, and the expected postoperative recovery.  I discussed at  length the potential risks to him with the operation, which would be  increased due to his morbid obesity and diabetes and deconditioned state.  He understood the risks to include stroke, bleeding, blood transfusion, infection, pulmonary problems  including pleural effusion, arrhythmias, organ failure, and death.  After reviewing these issues, he demonstrated his understanding and agreed to proceed with surgery and what I felt was informed consent.  OPERATIVE FINDINGS:  Poor quality targets because of the diabetic pattern.  The LAD required endarterectomy in order to perform a distal anastomosis and a very heavily calcified and diffusely diseased vessel.  The patient suffered from cardiopulmonary  bypass and had good global LV function after closing the chest.  DESCRIPTION OF PROCEDURE:  The patient was brought to the operating room and placed supine on the operating table.  General anesthesia was induced under invasive hemodynamic monitoring.  A transesophageal echo probe was placed by the anesthesia team.   The chest, abdomen and legs were prepped with Betadine and draped as a sterile field.  A sternal incision was made, and the saphenous vein was harvested endoscopically from the right leg.  The left internal mammary artery was harvested as a pedicle graft  from its origin at the subclavian vessels.  It was 1.5 mm vessel but had good flow.  The sternal retractor was placed using the deep blades because of the patient's obese body habitus.  The pericardium was opened.  There were large amounts of fat in the  pericardial space, pleural space, and mediastinum.  The heart had a thick layer of epicardial fat.  Pursestrings were placed in the ascending aorta and the right atrium, and heparin was administered.  The patient was cannulated when the ACT was therapeutic and placed on cardiopulmonary bypass.  The coronaries were identified for  grafting.  The distal  circumflex posterolateral, the distal LAD, and the diagonal were found to be suboptimal but adequate targets.  The  circumflex had no good targets for grafting, and the main vessel had minimal disease.  Cardioplegia cannula was replaced for both antegrade and retrograde cold blood cardioplegia, and the patient was cooled to 32 degrees.  A cross-clamp was applied, and a liter of cold blood cardioplegia was delivered in split doses, beginning in the  antegrade aortic and retrograde coronary sinus catheters.  There was good cardioplegic arrest and esophageal temperature dropped less than 12 degrees.  Cardioplegia was redosed every 20 minutes.  The distal coronary anastomoses were performed.  The first distal anastomosis was the posterolateral branch of the right coronary.  This was a 1.5 mm vessel.  There was proximal total occlusion.  A reverse saphenous vein was sewn in the side with a  running 7-0 Prolene with good flow through the graft.  Cardioplegia was redosed.  The second distal anastomosis was of the diagonal branch to LAD.  This had a proximal 95% stenosis.  A reverse saphenous vein was sewn end-to-side with running 7-0 Prolene, and there was good flow through the graft.  Cardioplegia was redosed.  The third distal anastomosis with the distal third of the LAD.  It was a heavily calcified vessel.  The left mammary artery was brought through an opening in the left lateral pericardium.  It was brought onto the LAD and sewn end-to-side.  However, this  anastomosis encountered heavy calcification on the distal aspect of the anastomosis, and the stainless-steel needle could not penetrate the wall of the vessel, so the anastomosis was taken down.  The mammary artery refreshened at its end, and a formal  endarterectomy of the LAD was performed, extracting a cast of calcium from the proximal and distal vessel, which was submitted to pathology.  The artery was then irrigated, and the soft remaining adventitia was then used to construct the anastomosis with  the mammary artery with a running 8-0 Prolene.  There was good  flow through the anastomosis after briefly releasing the pedicle bulldog on the mammary artery.  The bulldog was reapplied, and the pedicle secured to the epicardium with 6-0 Prolenes.   Cardioplegia was re-dosed.  The 2 proximal anastomoses were performed using 4.5 mm punch and running 6-0 Prolene and _____ of the coronaries with a dose of retrograde warm blood cardioplegia.  The cross-clamp was removed.  The heart resumed a spontaneous rhythm.  The vein grafts were deaired and opened.  Each had good flow.  Hemostasis was documented at the proximal and distal sites.  The patient was rewarmed to reperfuse.  Temporary pacing wires were applied.  The lungs  were expanded and the ventilator resumed.  The patient was started on low-dose milrinone.  He was weaned off cardiopulmonary bypass without difficulty.  Echo showed good LV global function.  Protamine was administered without adverse reaction.  The  cannulas were removed.  The mediastinum was irrigated.  The superior pericardial valve was closed over the aorta.  Anterior mediastinal and left pleural chest tubes were placed and brought out through separate incisions.  The sternum was closed with  wire.  The pectoralis fascia was closed with a running #1 Vicryl.  The subcutaneous layer was closed with a running Vicryl.  The skin was closed with a subcuticular running Vicryl.  Total cardiopulmonary bypass time was 147 minutes.  LN/NUANCE  D:01/18/2018 T:01/18/2018 JOB:[id number]

## 2018-01-19 NOTE — Progress Notes (Signed)
CT surgery p.m. Rounds  Patient examined and record reviewed.Hemodynamics stable,labs satisfactory.Patient had stable day.Continue current care. Tharon Aquas Trigt III 01/19/2018

## 2018-01-19 NOTE — Progress Notes (Signed)
Patient refused CPAP HS. Patient states he does not wear a CPAP at home.

## 2018-01-20 ENCOUNTER — Inpatient Hospital Stay (HOSPITAL_COMMUNITY): Payer: Medicare Other

## 2018-01-20 LAB — POCT I-STAT, CHEM 8
BUN: 33 mg/dL — ABNORMAL HIGH (ref 6–20)
Calcium, Ion: 1.11 mmol/L — ABNORMAL LOW (ref 1.15–1.40)
Chloride: 101 mmol/L (ref 101–111)
Creatinine, Ser: 2.3 mg/dL — ABNORMAL HIGH (ref 0.61–1.24)
Glucose, Bld: 176 mg/dL — ABNORMAL HIGH (ref 65–99)
HCT: 24 % — ABNORMAL LOW (ref 39.0–52.0)
Hemoglobin: 8.2 g/dL — ABNORMAL LOW (ref 13.0–17.0)
Potassium: 4 mmol/L (ref 3.5–5.1)
Sodium: 138 mmol/L (ref 135–145)
TCO2: 23 mmol/L (ref 22–32)

## 2018-01-20 LAB — BASIC METABOLIC PANEL
Anion gap: 11 (ref 5–15)
BUN: 29 mg/dL — ABNORMAL HIGH (ref 6–20)
CO2: 24 mmol/L (ref 22–32)
Calcium: 8.2 mg/dL — ABNORMAL LOW (ref 8.9–10.3)
Chloride: 105 mmol/L (ref 101–111)
Creatinine, Ser: 2.1 mg/dL — ABNORMAL HIGH (ref 0.61–1.24)
GFR calc Af Amer: 34 mL/min — ABNORMAL LOW (ref >60)
GFR calc non Af Amer: 29 mL/min — ABNORMAL LOW (ref >60)
Glucose, Bld: 168 mg/dL — ABNORMAL HIGH (ref 65–99)
Potassium: 4 mmol/L (ref 3.5–5.1)
Sodium: 140 mmol/L (ref 135–145)

## 2018-01-20 LAB — GLUCOSE, CAPILLARY
Glucose-Capillary: 117 mg/dL — ABNORMAL HIGH (ref 65–99)
Glucose-Capillary: 145 mg/dL — ABNORMAL HIGH (ref 65–99)
Glucose-Capillary: 150 mg/dL — ABNORMAL HIGH (ref 65–99)
Glucose-Capillary: 153 mg/dL — ABNORMAL HIGH (ref 65–99)
Glucose-Capillary: 163 mg/dL — ABNORMAL HIGH (ref 65–99)
Glucose-Capillary: 170 mg/dL — ABNORMAL HIGH (ref 65–99)
Glucose-Capillary: 183 mg/dL — ABNORMAL HIGH (ref 65–99)

## 2018-01-20 LAB — CBC
HCT: 28.3 % — ABNORMAL LOW (ref 39.0–52.0)
Hemoglobin: 8.9 g/dL — ABNORMAL LOW (ref 13.0–17.0)
MCH: 29.3 pg (ref 26.0–34.0)
MCHC: 31.4 g/dL (ref 30.0–36.0)
MCV: 93.1 fL (ref 78.0–100.0)
Platelets: 160 10*3/uL (ref 150–400)
RBC: 3.04 MIL/uL — ABNORMAL LOW (ref 4.22–5.81)
RDW: 14.9 % (ref 11.5–15.5)
WBC: 10.9 10*3/uL — ABNORMAL HIGH (ref 4.0–10.5)

## 2018-01-20 MED ORDER — GUAIFENESIN ER 600 MG PO TB12
600.0000 mg | ORAL_TABLET | Freq: Two times a day (BID) | ORAL | Status: DC
  Administered 2018-01-20 – 2018-01-28 (×17): 600 mg via ORAL
  Filled 2018-01-20 (×17): qty 1

## 2018-01-20 MED ORDER — CEFTAZIDIME 1 G IJ SOLR
1.0000 g | Freq: Three times a day (TID) | INTRAMUSCULAR | Status: DC
  Filled 2018-01-20: qty 1

## 2018-01-20 MED ORDER — AMIODARONE HCL 200 MG PO TABS
400.0000 mg | ORAL_TABLET | Freq: Two times a day (BID) | ORAL | Status: DC
  Administered 2018-01-20 – 2018-01-21 (×3): 400 mg via ORAL
  Filled 2018-01-20 (×3): qty 2

## 2018-01-20 MED ORDER — DIPHENHYDRAMINE HCL 50 MG/ML IJ SOLN
25.0000 mg | Freq: Every evening | INTRAMUSCULAR | Status: DC | PRN
  Administered 2018-01-21: 25 mg via INTRAVENOUS
  Filled 2018-01-20: qty 1

## 2018-01-20 MED ORDER — SODIUM CHLORIDE 0.9 % IV SOLN
1.0000 g | Freq: Two times a day (BID) | INTRAVENOUS | Status: DC
  Administered 2018-01-20 – 2018-01-24 (×10): 1 g via INTRAVENOUS
  Filled 2018-01-20 (×11): qty 1

## 2018-01-20 MED ORDER — PNEUMOCOCCAL VAC POLYVALENT 25 MCG/0.5ML IJ INJ: 0.5000 mL | INJECTION | INTRAMUSCULAR | Status: DC

## 2018-01-20 NOTE — Plan of Care (Signed)
  Problem: Activity: Goal: Risk for activity intolerance will decrease Outcome: Progressing Note:  Patient able to ambulate 1 full lap in hallway   Problem: Pain Managment: Goal: General experience of comfort will improve Outcome: Progressing  Patient able to maintain pain control with PRN tramadol

## 2018-01-20 NOTE — Progress Notes (Signed)
2 Days Post-Op Procedure(s) (LRB): CORONARY ARTERY BYPASS GRAFTING (CABG) x 3; Using Left Internal Mammary Artery, and Right Greater Saphenous Vein harvested Endoscopically, Coronary Artery Endarterectomy (N/A) TRANSESOPHAGEAL ECHOCARDIOGRAM (TEE) (N/A) Subjective: nsr with PVC Slow progress LLL air space dz Objective: Vital signs in last 24 hours: Temp:  [98 F (36.7 C)-99.3 F (37.4 C)] 98.2 F (36.8 C) (05/12 0736) Pulse Rate:  [66-106] 106 (05/12 0800) Cardiac Rhythm: Sinus tachycardia (05/12 0800) Resp:  [13-23] 23 (05/12 0800) BP: (99-187)/(45-145) 106/77 (05/12 0800) SpO2:  [89 %-98 %] 96 % (05/12 0800) Weight:  [292 lb 5.3 oz (132.6 kg)] 292 lb 5.3 oz (132.6 kg) (05/12 0425)  Hemodynamic parameters for last 24 hours:    Intake/Output from previous day: 05/11 0701 - 05/12 0700 In: 1114.2 [P.O.:360; I.V.:554.2; IV Piggyback:200] Out: 1475 [Urine:1225; Chest Tube:250] Intake/Output this shift: Total I/O In: 277.2 [P.O.:240; I.V.:37.2] Out: 68 [Urine:75; Chest Tube:10]       Exam    General- alert and comfortable    Neck- no JVD, no cervical adenopathy palpable, no carotid bruit   Lungs- clear without rales, wheezes   Cor- regular rate and rhythm, no murmur , gallop   Abdomen- soft, non-tender   Extremities - warm, non-tender, minimal edema   Neuro- oriented, appropriate, no focal weakness   Lab Results: Recent Labs    01/19/18 1621 01/19/18 1623 01/20/18 0534  WBC 10.1  --  10.9*  HGB 8.9* 9.2* 8.9*  HCT 28.1* 27.0* 28.3*  PLT 141*  --  160   BMET:  Recent Labs    01/19/18 0400  01/19/18 1623 01/20/18 0534  NA 143  --  139 140  K 4.2  --  4.2 4.0  CL 111  --  102 105  CO2 25  --   --  24  GLUCOSE 108*  --  182* 168*  BUN 23*  --  24* 29*  CREATININE 1.59*   < > 1.70* 2.10*  CALCIUM 8.0*  --   --  8.2*   < > = values in this interval not displayed.    PT/INR:  Recent Labs    01/18/18 1416  LABPROT 15.1  INR 1.20   ABG    Component  Value Date/Time   PHART 7.378 01/19/2018 0512   HCO3 21.8 01/19/2018 0512   TCO2 23 01/19/2018 1623   ACIDBASEDEF 3.0 (H) 01/19/2018 0512   O2SAT 97.0 01/19/2018 0512   CBG (last 3)  Recent Labs    01/20/18 0319 01/20/18 0530 01/20/18 0734  GLUCAP 150* 163* 153*    Assessment/Plan: S/P Procedure(s) (LRB): CORONARY ARTERY BYPASS GRAFTING (CABG) x 3; Using Left Internal Mammary Artery, and Right Greater Saphenous Vein harvested Endoscopically, Coronary Artery Endarterectomy (N/A) TRANSESOPHAGEAL ECHOCARDIOGRAM (TEE) (N/A) Mobilize Diuresis start po amiodarone   LOS: 2 days    [name] Young 01/20/2018

## 2018-01-21 ENCOUNTER — Inpatient Hospital Stay (HOSPITAL_COMMUNITY): Payer: Medicare Other

## 2018-01-21 ENCOUNTER — Encounter (HOSPITAL_COMMUNITY): Payer: Self-pay | Admitting: Cardiothoracic Surgery

## 2018-01-21 ENCOUNTER — Other Ambulatory Visit: Payer: Self-pay

## 2018-01-21 LAB — COMPREHENSIVE METABOLIC PANEL
ALT: 17 U/L (ref 17–63)
AST: 23 U/L (ref 15–41)
Albumin: 2.8 g/dL — ABNORMAL LOW (ref 3.5–5.0)
Alkaline Phosphatase: 27 U/L — ABNORMAL LOW (ref 38–126)
Anion gap: 11 (ref 5–15)
BUN: 43 mg/dL — ABNORMAL HIGH (ref 6–20)
CO2: 24 mmol/L (ref 22–32)
Calcium: 8 mg/dL — ABNORMAL LOW (ref 8.9–10.3)
Chloride: 104 mmol/L (ref 101–111)
Creatinine, Ser: 2.91 mg/dL — ABNORMAL HIGH (ref 0.61–1.24)
GFR calc Af Amer: 23 mL/min — ABNORMAL LOW (ref >60)
GFR calc non Af Amer: 20 mL/min — ABNORMAL LOW (ref >60)
Glucose, Bld: 143 mg/dL — ABNORMAL HIGH (ref 65–99)
Potassium: 4.1 mmol/L (ref 3.5–5.1)
Sodium: 139 mmol/L (ref 135–145)
Total Bilirubin: 0.9 mg/dL (ref 0.3–1.2)
Total Protein: 5.7 g/dL — ABNORMAL LOW (ref 6.5–8.1)

## 2018-01-21 LAB — COOXEMETRY PANEL
Carboxyhemoglobin: 1.1 % (ref 0.5–1.5)
Methemoglobin: 1.4 % (ref 0.0–1.5)
O2 Saturation: 53.7 %
Total hemoglobin: 8 g/dL — ABNORMAL LOW (ref 12.0–16.0)

## 2018-01-21 LAB — GLUCOSE, CAPILLARY
Glucose-Capillary: 173 mg/dL — ABNORMAL HIGH (ref 65–99)
Glucose-Capillary: 133 mg/dL — ABNORMAL HIGH (ref 65–99)
Glucose-Capillary: 135 mg/dL — ABNORMAL HIGH (ref 65–99)
Glucose-Capillary: 177 mg/dL — ABNORMAL HIGH (ref 65–99)
Glucose-Capillary: 165 mg/dL — ABNORMAL HIGH (ref 65–99)
Glucose-Capillary: 125 mg/dL — ABNORMAL HIGH (ref 65–99)
Glucose-Capillary: 120 mg/dL — ABNORMAL HIGH (ref 65–99)

## 2018-01-21 LAB — CBC
HCT: 26.4 % — ABNORMAL LOW (ref 39.0–52.0)
Hemoglobin: 8.5 g/dL — ABNORMAL LOW (ref 13.0–17.0)
MCH: 29.9 pg (ref 26.0–34.0)
MCHC: 32.2 g/dL (ref 30.0–36.0)
MCV: 93 fL (ref 78.0–100.0)
Platelets: 170 10*3/uL (ref 150–400)
RBC: 2.84 MIL/uL — ABNORMAL LOW (ref 4.22–5.81)
RDW: 15.3 % (ref 11.5–15.5)
WBC: 9.5 10*3/uL (ref 4.0–10.5)

## 2018-01-21 LAB — POCT I-STAT 3, ART BLOOD GAS (G3+)
Acid-base deficit: 5 mmol/L — ABNORMAL HIGH (ref 0.0–2.0)
Bicarbonate: 20 mmol/L (ref 20.0–28.0)
O2 Saturation: 98 %
Patient temperature: 97.8
TCO2: 21 mmol/L — ABNORMAL LOW (ref 22–32)
pCO2 arterial: 33.1 mmHg (ref 32.0–48.0)
pH, Arterial: 7.388 (ref 7.350–7.450)
pO2, Arterial: 95 mmHg (ref 83.0–108.0)

## 2018-01-21 LAB — POCT I-STAT 4, (NA,K, GLUC, HGB,HCT)
Glucose, Bld: 131 mg/dL — ABNORMAL HIGH (ref 65–99)
HCT: 26 % — ABNORMAL LOW (ref 39.0–52.0)
Hemoglobin: 8.8 g/dL — ABNORMAL LOW (ref 13.0–17.0)
Potassium: 4 mmol/L (ref 3.5–5.1)
Sodium: 143 mmol/L (ref 135–145)

## 2018-01-21 MED ORDER — SODIUM CHLORIDE 0.9% FLUSH: 3.0000 mL | INTRAVENOUS | Status: DC | PRN

## 2018-01-21 MED ORDER — FUROSEMIDE 10 MG/ML IJ SOLN
80.0000 mg | Freq: Once | INTRAMUSCULAR | Status: AC
  Administered 2018-01-21: 80 mg via INTRAVENOUS
  Filled 2018-01-21: qty 8

## 2018-01-21 MED ORDER — MOVING RIGHT ALONG BOOK
Freq: Once | Status: AC
  Administered 2018-01-21: 09:00:00
  Filled 2018-01-21: qty 1

## 2018-01-21 MED ORDER — AMIODARONE LOAD VIA INFUSION
150.0000 mg | Freq: Once | INTRAVENOUS | Status: AC
  Administered 2018-01-21: 150 mg via INTRAVENOUS
  Filled 2018-01-21: qty 83.34

## 2018-01-21 MED ORDER — AMIODARONE HCL IN DEXTROSE 360-4.14 MG/200ML-% IV SOLN
60.0000 mg/h | INTRAVENOUS | Status: AC
  Administered 2018-01-21 (×2): 60 mg/h via INTRAVENOUS
  Filled 2018-01-21: qty 200

## 2018-01-21 MED ORDER — MAGNESIUM HYDROXIDE 400 MG/5ML PO SUSP: 30.0000 mL | Freq: Every day | ORAL | Status: DC | PRN

## 2018-01-21 MED ORDER — MILRINONE LACTATE IN DEXTROSE 20-5 MG/100ML-% IV SOLN
0.1250 ug/kg/min | INTRAVENOUS | Status: DC
  Administered 2018-01-21 – 2018-01-23 (×5): 0.25 ug/kg/min via INTRAVENOUS
  Filled 2018-01-21 (×5): qty 100

## 2018-01-21 MED ORDER — INSULIN ASPART 100 UNIT/ML ~~LOC~~ SOLN
0.0000 [IU] | Freq: Three times a day (TID) | SUBCUTANEOUS | Status: DC
  Administered 2018-01-21: 2 [IU] via SUBCUTANEOUS
  Administered 2018-01-21 (×2): 4 [IU] via SUBCUTANEOUS
  Administered 2018-01-22 – 2018-01-26 (×9): 2 [IU] via SUBCUTANEOUS
  Administered 2018-01-26: 4 [IU] via SUBCUTANEOUS

## 2018-01-21 MED ORDER — LACTULOSE 10 GM/15ML PO SOLN
30.0000 g | Freq: Every day | ORAL | Status: AC
  Administered 2018-01-21 – 2018-01-22 (×2): 30 g via ORAL
  Filled 2018-01-21 (×2): qty 45

## 2018-01-21 MED ORDER — ENOXAPARIN SODIUM 30 MG/0.3ML ~~LOC~~ SOLN
30.0000 mg | SUBCUTANEOUS | Status: DC
  Administered 2018-01-21 – 2018-01-27 (×7): 30 mg via SUBCUTANEOUS
  Filled 2018-01-21 (×7): qty 0.3

## 2018-01-21 MED ORDER — MILRINONE LACTATE IN DEXTROSE 20-5 MG/100ML-% IV SOLN: 0.2500 ug/kg/min | INTRAVENOUS | Status: DC

## 2018-01-21 MED ORDER — AMIODARONE HCL IN DEXTROSE 360-4.14 MG/200ML-% IV SOLN
INTRAVENOUS | Status: AC
  Administered 2018-01-21: 60 mg/h via INTRAVENOUS
  Filled 2018-01-21: qty 200

## 2018-01-21 MED ORDER — SODIUM BICARBONATE 8.4 % IV SOLN
25.0000 meq | Freq: Once | INTRAVENOUS | Status: AC
  Administered 2018-01-21: 25 meq via INTRAVENOUS
  Filled 2018-01-21: qty 50

## 2018-01-21 MED ORDER — AMIODARONE HCL IN DEXTROSE 360-4.14 MG/200ML-% IV SOLN
30.0000 mg/h | INTRAVENOUS | Status: DC
  Administered 2018-01-21 – 2018-01-22 (×2): 30 mg/h via INTRAVENOUS
  Filled 2018-01-21 (×3): qty 200

## 2018-01-21 MED ORDER — INSULIN ASPART 100 UNIT/ML ~~LOC~~ SOLN: 0.0000 [IU] | SUBCUTANEOUS | Status: DC

## 2018-01-21 MED ORDER — SODIUM CHLORIDE 0.9% FLUSH
3.0000 mL | Freq: Two times a day (BID) | INTRAVENOUS | Status: DC
  Administered 2018-01-21 – 2018-01-23 (×5): 3 mL via INTRAVENOUS
  Administered 2018-01-24: 10 mL via INTRAVENOUS
  Administered 2018-01-24 – 2018-01-26 (×4): 3 mL via INTRAVENOUS

## 2018-01-21 MED ORDER — SODIUM CHLORIDE 0.9 % IV SOLN
250.0000 mL | INTRAVENOUS | Status: DC | PRN
  Administered 2018-01-25: 250 mL via INTRAVENOUS

## 2018-01-21 NOTE — Progress Notes (Signed)
Patient very drowsy, easily aroused but falls back to sleep quickly. At the beginning of the shift he was more alert and talkative. Patient received Oxycodone 5 mg around 0840, which may be causing drowsiness. Wife at bedside. Will continue to monitor closely.

## 2018-01-21 NOTE — Progress Notes (Signed)
3 Days Post-Op Procedure(s) (LRB): CORONARY ARTERY BYPASS GRAFTING (CABG) x 3; Using Left Internal Mammary Artery, and Right Greater Saphenous Vein harvested Endoscopically, Coronary Artery Endarterectomy (N/A) TRANSESOPHAGEAL ECHOCARDIOGRAM (TEE) (N/A) Subjective: Stronger, walking Creat up to 2.9, LE edema 2+ nsr with PVCs Objective: Vital signs in last 24 hours: Temp:  [97.7 F (36.5 C)-98.9 F (37.2 C)] 98 F (36.7 C) (05/13 0700) Pulse Rate:  [66-110] 70 (05/13 0700) Cardiac Rhythm: Sinus tachycardia (05/13 0000) Resp:  [11-23] 13 (05/13 0700) BP: (109-135)/(49-121) 122/66 (05/13 0500) SpO2:  [91 %-100 %] 100 % (05/13 0700) Weight:  [294 lb 1.5 oz (133.4 kg)] 294 lb 1.5 oz (133.4 kg) (05/13 0600)  Hemodynamic parameters for last 24 hours:    Intake/Output from previous day: 05/12 0701 - 05/13 0700 In: 1171 [P.O.:720; I.V.:351; IV Piggyback:100] Out: 585 [Urine:575; Chest Tube:10] Intake/Output this shift: No intake/output data recorded.       Exam    General- alert and comfortable    Neck- no JVD, no cervical adenopathy palpable, no carotid bruit   Lungs- clear without rales, wheezes   Cor- regular rate and rhythm, no murmur , gallop   Abdomen- soft, non-tender   Extremities - warm, non-tender,2+l edema   Neuro- oriented, appropriate, no focal weakness   Lab Results: Recent Labs    01/20/18 0534 01/20/18 1634 01/21/18 0305  WBC 10.9*  --  9.5  HGB 8.9* 8.2* 8.5*  HCT 28.3* 24.0* 26.4*  PLT 160  --  170   BMET:  Recent Labs    01/20/18 0534 01/20/18 1634 01/21/18 0305  NA 140 138 139  K 4.0 4.0 4.1  CL 105 101 104  CO2 24  --  24  GLUCOSE 168* 176* 143*  BUN 29* 33* 43*  CREATININE 2.10* 2.30* 2.91*  CALCIUM 8.2*  --  8.0*    PT/INR:  Recent Labs    01/18/18 1416  LABPROT 15.1  INR 1.20   ABG    Component Value Date/Time   PHART 7.378 01/19/2018 0512   HCO3 21.8 01/19/2018 0512   TCO2 23 01/20/2018 1634   ACIDBASEDEF 3.0 (H)  01/19/2018 0512   O2SAT 97.0 01/19/2018 0512   CBG (last 3)  Recent Labs    01/20/18 2003 01/20/18 2334 01/21/18 0349  GLUCAP 183* 117* 133*    Assessment/Plan: S/P Procedure(s) (LRB): CORONARY ARTERY BYPASS GRAFTING (CABG) x 3; Using Left Internal Mammary Artery, and Right Greater Saphenous Vein harvested Endoscopically, Coronary Artery Endarterectomy (N/A) TRANSESOPHAGEAL ECHOCARDIOGRAM (TEE) (N/A) Mobilize Diuresis Diabetes control Plan for transfer to step-down: see transfer orders plavix for LAD endarterectomy Follow creat  LOS: 3 days    Tharon Aquas Trigt III 01/21/2018

## 2018-01-21 NOTE — Progress Notes (Signed)
Patient ID: [name] Young., male   DOB: [birth year], 77 y.o.   MRN: [id number] TCTS Evening Rounds:  Hemodynamically stable in sinus rhythm on amiodarone On Milrinone 0.25 CVP 17 Urine output ok, creat 2.9 this am.

## 2018-01-21 NOTE — Progress Notes (Signed)
Tried to transfer patient to [facility], but patient couldn't stand and wouldn't move his feet. Nursing staff had to lift him and put [facility] under him. Patient is very weak. Patient is still drowsy but responsive. 1530: Three nursing staff transferred patient back to bed from [facility]. Patient didn't help at all or move his feet again.  Once in bed and head laid flat, patient c/o being SOB. CVP checked and was 17. 1600: Notified Dr. Prescott Gum of the drowsiness, SOB, CVP reading, and low urine output. Patient received Lasix 80 mg IV this am and only had 150 ml's out after.  New orders: ABG, restart Milrinone, CVP's Q 4, Co-ox, leave Foley in. Wife at bedside and daughter on phone and updated. Will continue to monitor closely.

## 2018-01-21 NOTE — Progress Notes (Signed)
Patient wanted to stand up and get repositioned in chair. Patient could not get up and didn't help nursing staff stand him, his legs were bent. Patient has stood up 3-4 other times this shift and ambulated around whole unit on the previous shift. Will continue to monitor.

## 2018-01-22 ENCOUNTER — Inpatient Hospital Stay (HOSPITAL_COMMUNITY): Payer: Medicare Other

## 2018-01-22 LAB — POCT I-STAT 3, ART BLOOD GAS (G3+)
Acid-base deficit: 3 mmol/L — ABNORMAL HIGH (ref 0.0–2.0)
Bicarbonate: 20.6 mmol/L (ref 20.0–28.0)
O2 Saturation: 94 %
Patient temperature: 98.6
TCO2: 22 mmol/L (ref 22–32)
pCO2 arterial: 31.6 mmHg — ABNORMAL LOW (ref 32.0–48.0)
pH, Arterial: 7.422 (ref 7.350–7.450)
pO2, Arterial: 67 mmHg — ABNORMAL LOW (ref 83.0–108.0)

## 2018-01-22 LAB — GLUCOSE, CAPILLARY
Glucose-Capillary: 119 mg/dL — ABNORMAL HIGH (ref 65–99)
Glucose-Capillary: 143 mg/dL — ABNORMAL HIGH (ref 65–99)
Glucose-Capillary: 135 mg/dL — ABNORMAL HIGH (ref 65–99)

## 2018-01-22 LAB — BASIC METABOLIC PANEL
Anion gap: 12 (ref 5–15)
BUN: 60 mg/dL — ABNORMAL HIGH (ref 6–20)
CO2: 24 mmol/L (ref 22–32)
Calcium: 7.7 mg/dL — ABNORMAL LOW (ref 8.9–10.3)
Chloride: 100 mmol/L — ABNORMAL LOW (ref 101–111)
Creatinine, Ser: 3.3 mg/dL — ABNORMAL HIGH (ref 0.61–1.24)
GFR calc Af Amer: 20 mL/min — ABNORMAL LOW (ref >60)
GFR calc non Af Amer: 17 mL/min — ABNORMAL LOW (ref >60)
Glucose, Bld: 136 mg/dL — ABNORMAL HIGH (ref 65–99)
Potassium: 3.7 mmol/L (ref 3.5–5.1)
Sodium: 136 mmol/L (ref 135–145)

## 2018-01-22 LAB — URINALYSIS, ROUTINE W REFLEX MICROSCOPIC
Bilirubin Urine: NEGATIVE
GLUCOSE, UA: NEGATIVE mg/dL
Ketones, ur: NEGATIVE mg/dL
Nitrite: NEGATIVE
PH: 5 (ref 5.0–8.0)
PROTEIN: NEGATIVE mg/dL
Specific Gravity, Urine: 1.027 (ref 1.005–1.030)

## 2018-01-22 LAB — COOXEMETRY PANEL
Carboxyhemoglobin: 1.5 % (ref 0.5–1.5)
Carboxyhemoglobin: 1.2 % (ref 0.5–1.5)
Methemoglobin: 0.9 % (ref 0.0–1.5)
Methemoglobin: 1.5 % (ref 0.0–1.5)
O2 Saturation: 50.7 %
O2 Saturation: 52.8 %
Total hemoglobin: 10.6 g/dL — ABNORMAL LOW (ref 12.0–16.0)
Total hemoglobin: 9.6 g/dL — ABNORMAL LOW (ref 12.0–16.0)

## 2018-01-22 LAB — CBC
HCT: 24.6 % — ABNORMAL LOW (ref 39.0–52.0)
Hemoglobin: 8.1 g/dL — ABNORMAL LOW (ref 13.0–17.0)
MCH: 30.2 pg (ref 26.0–34.0)
MCHC: 32.9 g/dL (ref 30.0–36.0)
MCV: 91.8 fL (ref 78.0–100.0)
Platelets: 177 10*3/uL (ref 150–400)
RBC: 2.68 MIL/uL — ABNORMAL LOW (ref 4.22–5.81)
RDW: 15.7 % — ABNORMAL HIGH (ref 11.5–15.5)
WBC: 7.1 10*3/uL (ref 4.0–10.5)

## 2018-01-22 MED ORDER — TRAMADOL HCL 50 MG PO TABS
50.0000 mg | ORAL_TABLET | ORAL | Status: DC | PRN
  Administered 2018-01-23: 50 mg via ORAL
  Filled 2018-01-22: qty 1

## 2018-01-22 MED ORDER — AMIODARONE HCL 200 MG PO TABS
400.0000 mg | ORAL_TABLET | Freq: Two times a day (BID) | ORAL | Status: DC
  Administered 2018-01-23: 400 mg via ORAL
  Filled 2018-01-22: qty 2

## 2018-01-22 MED ORDER — AMIODARONE HCL 200 MG PO TABS: 400.0000 mg | ORAL_TABLET | Freq: Every day | ORAL | Status: DC

## 2018-01-22 MED ORDER — POTASSIUM CHLORIDE CRYS ER 20 MEQ PO TBCR
20.0000 meq | EXTENDED_RELEASE_TABLET | ORAL | Status: DC
  Administered 2018-01-22: 20 meq via ORAL
  Filled 2018-01-22: qty 1

## 2018-01-22 MED FILL — Potassium Chloride Inj 2 mEq/ML: INTRAVENOUS | Qty: 40 | Status: AC

## 2018-01-22 MED FILL — Magnesium Sulfate Inj 50%: INTRAMUSCULAR | Qty: 10 | Status: AC

## 2018-01-22 MED FILL — Heparin Sodium (Porcine) Inj 1000 Unit/ML: INTRAMUSCULAR | Qty: 30 | Status: AC

## 2018-01-22 NOTE — Progress Notes (Signed)
  Amiodarone Drug - Drug Interaction Consult Note  Recommendations: Continue simvastatin 20mg  qhs- if a higher intensity statin is required, recommendation is to change statin rather than increase simvastatin dose. Muscle aches could occur.  Watch for bradycardia with metoprolol.  Amiodarone is metabolized by the cytochrome P450 system and therefore has the potential to cause many drug interactions. Amiodarone has an average plasma half-life of 50 days (range 20 to 100 days).   There is potential for drug interactions to occur several weeks or months after stopping treatment and the onset of drug interactions may be slow after initiating amiodarone.   [x]  Statins: Increased risk of myopathy. Simvastatin- restrict dose to 20mg  daily. Other statins: counsel patients to report any muscle pain or weakness immediately.  []  Anticoagulants: Amiodarone can increase anticoagulant effect. Consider warfarin dose reduction. Patients should be monitored closely and the dose of anticoagulant altered accordingly, remembering that amiodarone levels take several weeks to stabilize.  []  Antiepileptics: Amiodarone can increase plasma concentration of phenytoin, the dose should be reduced. Note that small changes in phenytoin dose can result in large changes in levels. Monitor patient and counsel on signs of toxicity.  [x]  Beta blockers: increased risk of bradycardia, AV block and myocardial depression. Sotalol - avoid concomitant use.  []   Calcium channel blockers (diltiazem and verapamil): increased risk of bradycardia, AV block and myocardial depression.  []   Cyclosporine: Amiodarone increases levels of cyclosporine. Reduced dose of cyclosporine is recommended.  []  Digoxin dose should be halved when amiodarone is started.  []  Diuretics: increased risk of cardiotoxicity if hypokalemia occurs.  []  Oral hypoglycemic agents (glyburide, glipizide, glimepiride): increased risk of hypoglycemia. Patient's glucose  levels should be monitored closely when initiating amiodarone therapy.   []  Drugs that prolong the QT interval:  Torsades de pointes risk may be increased with concurrent use - avoid if possible.  Monitor QTc, also keep magnesium/potassium WNL if concurrent therapy can't be avoided. Marland Kitchen Antibiotics: e.g. fluoroquinolones, erythromycin. . Antiarrhythmics: e.g. quinidine, procainamide, disopyramide, sotalol. . Antipsychotics: e.g. phenothiazines, haloperidol.  . Lithium, tricyclic antidepressants, and methadone.   Thank You,  Chrisotpher Rivero D. Mandeep Kiser, PharmD, BCPS Clinical Pharmacist5/14/2019 5:48 PM

## 2018-01-22 NOTE — Progress Notes (Signed)
Stopped in to visit with this patient and family while rounding the floor.  Patient sitting up in chair expressing his desire to be back in the bed.  Family is concerned that he needs to eat while up in the chair but I expressed that I would notify the nurse of his concern of which I did.  Happy to continue to follow up with this family.     01/22/18 1203  Clinical Encounter Type  Visited With Patient and family together  Visit Type Initial;Spiritual support;Post-op

## 2018-01-22 NOTE — Progress Notes (Addendum)
Patient ID: Equan Cogbill., male   DOB: Mar 31, 1941, 77 y.o.   MRN: [id number] EVENING ROUNDS NOTE :     [address]       [place] [address]             [phone]                 4 Days Post-Op Procedure(s) (LRB): CORONARY ARTERY BYPASS GRAFTING (CABG) x 3; Using Left Internal Mammary Artery, and Right Greater Saphenous Vein harvested Endoscopically, Coronary Artery Endarterectomy (N/A) TRANSESOPHAGEAL ECHOCARDIOGRAM (TEE) (N/A)  Total Length of Stay:  LOS: 4 days  BP (!) 141/65 (BP Location: Left Arm)   Pulse 83   Temp (!) 96.2 F (35.7 C) (Axillary)   Resp 18   Ht 5\' 10"  (1.778 m)   Wt 297 lb 9.9 oz (135 kg)   SpO2 97%   BMI 42.70 kg/m   .Intake/Output      05/13 0701 - 05/14 0700 05/14 0701 - 05/15 0700   P.O. 720 480   I.V. (mL/kg) 785.6 (5.8) 216 (1.6)   IV Piggyback     Total Intake(mL/kg) 1505.6 (11.2) 696 (5.2)   Urine (mL/kg/hr) 580 (0.2) 211 (0.1)   Stool 0 2   Chest Tube     Total Output 580 213   Net +925.6 +483        Stool Occurrence 1 x      . sodium chloride    . amiodarone 30 mg/hr (01/22/18 0800)  . cefTAZidime (FORTAZ)  IV Stopped (01/22/18 1038)  . milrinone 0.25 mcg/kg/min (01/22/18 0800)     Lab Results  Component Value Date   WBC 7.1 01/22/2018   HGB 8.1 (L) 01/22/2018   HCT 24.6 (L) 01/22/2018   PLT 177 01/22/2018   GLUCOSE 136 (H) 01/22/2018   CHOL 117 12/10/2017   TRIG 159.0 (H) 12/10/2017   HDL 26.40 (L) 12/10/2017   LDLDIRECT 67.0 06/11/2017   LDLCALC 59 12/10/2017   ALT 17 01/21/2018   AST 23 01/21/2018   NA 136 01/22/2018   K 3.7 01/22/2018   CL 100 (L) 01/22/2018   CREATININE 3.30 (H) 01/22/2018   BUN 60 (H) 01/22/2018   CO2 24 01/22/2018   TSH 2.35 10/03/2013   PSA 0.83 11/05/2017   INR 1.20 01/18/2018   HGBA1C 6.8 (H) 01/16/2018   MICROALBUR 1.4 04/29/2014   lower extremity edema Sinus ,on iv Cordarone , convert to po Foley in place, cr up to 3.3   [name] MD  Beeper  [phone] Office [phone] 01/22/2018 5:37 PM

## 2018-01-22 NOTE — Progress Notes (Signed)
Rehab Admissions Coordinator Note:  Patient was screened by Cleatrice Burke for appropriateness for an Inpatient Acute Rehab Consult per PT recommendation.   At this time, we are recommending Inpatient Rehab consult if pt would like to be considered for admit. Also recommend OT eval. Please advise.  Cleatrice Burke 01/22/2018, 4:17 PM  I can be reached at [phone].

## 2018-01-22 NOTE — Evaluation (Signed)
Physical Therapy Evaluation Patient Details Name: [name] Young. MRN: [id number] DOB: 22-May-1941 Today's Date: 01/22/2018   History of Present Illness  Deverick Pruss. is a 77 y.o. male with past medical history significant for hypertension, diabetes mellitus, hyperlipidemia as well as history of kidney stones and chronic kidney disease.  Seems most recent creatinine is between 1.3 and 1.6.  He presented with shortness of breath-stress test was abnormal, EF 45 % cardiac catheterization demonstrated moderate diffuse vascular disease so underwent an elective coronary artery bypass grafting on 5/10 per Dr. Darcey Nora.    Clinical Impression  Pt admitted with the above. Pt was indep PTA and ambulating the unit the day after his surgery. Pt now with kidney dysfunction causing edema t/o body and further weakness and deconditioning. Pt able to tolerate amb of 60 today with RW. Recommend CIR Upon d/c as pt with good home set up and support and suspect would progress well once kidney dysfunction addressed.    Follow Up Recommendations CIR    Equipment Recommendations  (TBD by next venue)    Recommendations for Other Services Rehab consult     Precautions / Restrictions Precautions Precautions: Fall;Sternal Precaution Booklet Issued: No(will bring next session) Precaution Comments: pt and spouse with verbal understanding Restrictions Weight Bearing Restrictions: Yes Other Position/Activity Restrictions: sternal precautions      Mobility  Bed Mobility               General bed mobility comments: pt up in chair upon PT arrival  Transfers Overall transfer level: Needs assistance Equipment used: None Transfers: Sit to/from Stand Sit to Stand: Mod assist;+2 physical assistance         General transfer comment: modAx2 from lower surface, used rocking technique with pt hugging heart pillow, modAx2 to power up and steady upon standing during transition of  hands  Ambulation/Gait Ambulation/Gait assistance: Min assist;+2 safety/equipment Ambulation Distance (Feet): 60 Feet Assistive device: Rolling walker (2 wheeled) Gait Pattern/deviations: Step-through pattern;Decreased stride length;Wide base of support Gait velocity: slow Gait velocity interpretation: <1.8 ft/sec, indicate of risk for recurrent falls General Gait Details: slow, freq standing rest breaks, minimal bilat UE WBing  Stairs            Wheelchair Mobility    Modified Rankin (Stroke Patients Only)       Balance Overall balance assessment: Needs assistance Sitting-balance support: Feet supported;Bilateral upper extremity supported Sitting balance-Leahy Scale: Poor Sitting balance - Comments: requires UE support to maintain balance, unable ot maintain without, posterior lean   Standing balance support: Bilateral upper extremity supported Standing balance-Leahy Scale: Poor Standing balance comment: needs RW                             Pertinent Vitals/Pain Pain Assessment: No/denies pain    Home Living Family/patient expects to be discharged to:: Inpatient rehab Living Arrangements: Spouse/significant other               Additional Comments: was indep PTA    Prior Function Level of Independence: Independent         Comments: didn't use AD     Hand Dominance        Extremity/Trunk Assessment   Upper Extremity Assessment Upper Extremity Assessment: Generalized weakness    Lower Extremity Assessment Lower Extremity Assessment: Generalized weakness    Cervical / Trunk Assessment Cervical / Trunk Assessment: Normal  Communication   Communication: No difficulties  Cognition  Arousal/Alertness: Awake/alert Behavior During Therapy: WFL for tasks assessed/performed Overall Cognitive Status: Within Functional Limits for tasks assessed                                        General Comments General comments  (skin integrity, edema, etc.): pt with noted edema t/o body. per nephrology note pt +5L of fluid on him    Exercises General Exercises - Lower Extremity Ankle Circles/Pumps: AROM;Both;10 reps;Seated Quad Sets: AROM;10 reps;Both;Seated(with LEs elevated) Gluteal Sets: AROM;Both;10 reps;Seated   Assessment/Plan    PT Assessment Patient needs continued PT services  PT Problem List Decreased strength;Decreased range of motion;Decreased activity tolerance;Decreased balance;Decreased mobility;Cardiopulmonary status limiting activity;Decreased knowledge of use of DME       PT Treatment Interventions DME instruction;Gait training;Stair training;Functional mobility training;Therapeutic activities;Therapeutic exercise;Balance training    PT Goals (Current goals can be found in the Care Plan section)  Acute Rehab PT Goals Patient Stated Goal: get back to walking PT Goal Formulation: With patient/family Time For Goal Achievement: 02/05/18 Potential to Achieve Goals: Good    Frequency Min 3X/week   Barriers to discharge Decreased caregiver support wife unable to provide more than supervision level of care    Co-evaluation               AM-PAC PT "6 Clicks" Daily Activity  Outcome Measure Difficulty turning over in bed (including adjusting bedclothes, sheets and blankets)?: Unable Difficulty moving from lying on back to sitting on the side of the bed? : Unable Difficulty sitting down on and standing up from a chair with arms (e.g., wheelchair, bedside commode, etc,.)?: Unable Help needed moving to and from a bed to chair (including a wheelchair)?: A Little Help needed walking in hospital room?: A Little Help needed climbing 3-5 steps with a railing? : Total 6 Click Score: 10    End of Session Equipment Utilized During Treatment: Gait belt;Oxygen Activity Tolerance: Patient tolerated treatment well Patient left: in chair;with call bell/phone within reach;with chair alarm set;with  family/visitor present Nurse Communication: Mobility status PT Visit Diagnosis: Unsteadiness on feet (R26.81)    Time: 0940-1003 PT Time Calculation (min) (ACUTE ONLY): 23 min   Charges:   PT Evaluation $PT Eval Moderate Complexity: 1 Mod PT Treatments $Gait Training: 8-22 mins   PT G Codes:        Kittie Plater, PT, DPT Pager #: [phone] Office #: [phone]   [place] 01/22/2018, 2:53 PM

## 2018-01-22 NOTE — Consult Note (Signed)
[facility] Renal Consultation Note  Requesting MD: Prescott Gum  Indication for Consultation: AKI on CKD  HPI: [name] Young. is a 77 y.o. male with past medical history significant for hypertension, diabetes mellitus, hyperlipidemia as well as history of kidney stones and chronic kidney disease.  Seems most recent creatinine is between 1.3 and 1.6.  He presented with shortness of breath-stress test was abnormal, EF 45 % cardiac catheterization demonstrated moderate diffuse vascular disease so underwent an elective coronary artery bypass grafting on 5/10 per Dr. Darcey Nora.  Postoperative course has been fairly typical.  Immediately postop, creatinine which had been 1.2 increased to 1.63, then decreased to 1.4.  However, it then increased to around 1.8 on 5/11 and has progressed since, being 3.3 today.  There is no significant hypotension recorded-but some soft blood pressures.  He has been on milrinone and amiodarone drips most recently.  Urine output on 5/12 was 575, and on 5/13 580 -prior to that was over 1 L.  Currently patient is about 5 L positive-but was able to be extubated.  He appears to have been on lisinopril pre-surgery  Creat  Date/Time Value Ref Range Status  02/01/2010 0.95  Final   Creatinine, Ser  Date/Time Value Ref Range Status  01/22/2018 04:29 AM 3.30 (H) 0.61 - 1.24 mg/dL Final  01/21/2018 03:05 AM 2.91 (H) 0.61 - 1.24 mg/dL Final  01/20/2018 04:34 PM 2.30 (H) 0.61 - 1.24 mg/dL Final  01/20/2018 05:34 AM 2.10 (H) 0.61 - 1.24 mg/dL Final  01/19/2018 04:23 PM 1.70 (H) 0.61 - 1.24 mg/dL Final  01/19/2018 04:21 PM 1.79 (H) 0.61 - 1.24 mg/dL Final  01/19/2018 04:00 AM 1.59 (H) 0.61 - 1.24 mg/dL Final  01/18/2018 07:53 PM 1.40 (H) 0.61 - 1.24 mg/dL Final  01/18/2018 07:52 PM 1.63 (H) 0.61 - 1.24 mg/dL Final  01/18/2018 12:52 PM 1.30 (H) 0.61 - 1.24 mg/dL Final  01/18/2018 12:08 PM 1.30 (H) 0.61 - 1.24 mg/dL Final  01/18/2018 11:11 AM 1.30 (H) 0.61 - 1.24  mg/dL Final  01/18/2018 10:11 AM 1.20 0.61 - 1.24 mg/dL Final  01/18/2018 09:52 AM 1.40 (H) 0.61 - 1.24 mg/dL Final  01/18/2018 07:48 AM 1.50 (H) 0.61 - 1.24 mg/dL Final  01/16/2018 01:37 PM 1.66 (H) 0.61 - 1.24 mg/dL Final  01/11/2018 12:00 AM 1.63 (H) 0.76 - 1.27 mg/dL Final  12/24/2017 08:09 AM 1.31 (H) 0.76 - 1.27 mg/dL Final  12/19/2017 12:18 PM 1.34 (H) 0.76 - 1.27 mg/dL Final  12/10/2017 08:02 AM 1.42 0.40 - 1.50 mg/dL Final  11/05/2017 03:18 PM 1.42 0.40 - 1.50 mg/dL Final  06/11/2017 07:52 AM 1.38 0.40 - 1.50 mg/dL Final  11/06/2016 08:25 AM 1.28 0.40 - 1.50 mg/dL Final  05/01/2016 08:08 AM 1.39 0.40 - 1.50 mg/dL Final  11/01/2015 08:09 AM 1.32 0.40 - 1.50 mg/dL Final  04/23/2015 07:44 AM 1.46 0.40 - 1.50 mg/dL Final  10/29/2014 07:45 AM 1.32 0.40 - 1.50 mg/dL Final  04/29/2014 07:52 AM 1.4 0.4 - 1.5 mg/dL Final  10/03/2013 07:49 AM 1.5 0.4 - 1.5 mg/dL Final  07/03/2013 07:33 AM 1.3 0.4 - 1.5 mg/dL Final  06/24/2013 09:02 PM 1.90 (H) 0.50 - 1.35 mg/dL Final  01/14/2013 08:06 AM 1.3 0.4 - 1.5 mg/dL Final  10/04/2012 08:08 AM 1.3 0.4 - 1.5 mg/dL Final  04/02/2012 08:12 AM 1.1 0.4 - 1.5 mg/dL Final  10/06/2011 08:27 AM 1.1 0.4 - 1.5 mg/dL Final     PMHx:   Past Medical History:  Diagnosis Date  .  Arthritis   . Chronic kidney disease    kidney stones  . Coronary artery disease   . Dyspnea   . Dysrhythmia   . GERD (gastroesophageal reflux disease)   . History of cholelithiasis   . History of kidney stones    ca ox [name]) now [place]  . History of pneumonia   . HLD (hyperlipidemia)   . HTN (hypertension)   . Jaundice    age 65  . Pneumonia    years ago   . T2DM (type 2 diabetes mellitus) ([place]) 2010    Past Surgical History:  Procedure Laterality Date  . CARPAL TUNNEL RELEASE Bilateral   . CATARACT EXTRACTION, BILATERAL    . COLONOSCOPY  11/2012   11 adenomatous polyps, diverticulosis, rec rpt 1 yr Ardis Hughs)  . COLONOSCOPY  12/2013   3 polyps,  diverticulosis, rec rpt 3 yrs Ardis Hughs)  . CORONARY ARTERY BYPASS GRAFT N/A 01/18/2018   Procedure: CORONARY ARTERY BYPASS GRAFTING (CABG) x 3; Using Left Internal Mammary Artery, and Right Greater Saphenous Vein harvested Endoscopically, Coronary Artery Endarterectomy;  Surgeon: Ivin Poot, MD;  Location: [facility];  Service: Open Heart Surgery;  Laterality: N/A;  . KNEE CARTILAGE SURGERY Left   . LEFT HEART CATH AND CORONARY ANGIOGRAPHY N/A 12/21/2017   Procedure: LEFT HEART CATH AND CORONARY ANGIOGRAPHY;  Surgeon: Nelva Bush, MD;  Location: [facility];  Service: Cardiovascular;  Laterality: N/A;  . LITHOTRIPSY    . TEE WITHOUT CARDIOVERSION N/A 01/18/2018   Procedure: TRANSESOPHAGEAL ECHOCARDIOGRAM (TEE);  Surgeon: Prescott Gum, Collier Salina, MD;  Location: [facility];  Service: Open Heart Surgery;  Laterality: N/A;  . UMBILICAL HERNIA REPAIR     with mesh    Family Hx:  Family History  Problem Relation Age of Onset  . Alzheimer's disease Mother   . Breast cancer Mother        breast  . Atrial fibrillation Mother   . CAD Mother   . Stroke Father 65  . Diabetes Father   . CAD Father   . Rectal cancer Maternal Grandfather        rectal  . Colon cancer Maternal Grandfather 72  . Esophageal cancer Neg Hx   . Stomach cancer Neg Hx     Social History:  reports that he has never smoked. He quit smokeless tobacco use about 16 years ago. His smokeless tobacco use included chew. He reports that he drinks alcohol. He reports that he does not use drugs.  Allergies: No Known Allergies  Medications: Prior to Admission medications   Medication Sig Start Date End Date Taking? Authorizing Provider  aspirin EC 81 MG tablet Take 1 tablet (81 mg total) by mouth daily. Patient taking differently: Take 81 mg by mouth at bedtime.  12/21/17  Yes End, Harrell Gave, MD  carvedilol (COREG) 6.25 MG tablet Take 1 tablet (6.25 mg total) by mouth 2 (two) times daily. 12/28/17  Yes End, Harrell Gave, MD   Cholecalciferol (VITAMIN D) 2000 UNITS CAPS Take 2,000 Units by mouth daily.    Yes [provider]  doxazosin (CARDURA) 2 MG tablet Take 1 tablet (2 mg total) by mouth daily. 11/15/17 11/15/18 Yes Ria Bush, MD  fenofibrate (TRICOR) 145 MG tablet TAKE 1 TABLET BY MOUTH ONCE DAILY Patient taking differently: TAKE 1 TABLET BY MOUTH ONCE DAILY AT BEDTIME. 12/13/17  Yes Ria Bush, MD  lisinopril (PRINIVIL,ZESTRIL) 20 MG tablet TAKE 1 TABLET BY MOUTH DAILY Patient taking differently: TAKE 1 TABLET BY MOUTH  DAILY AT BEDTIME. 04/11/17  Yes Ria Bush, MD  metFORMIN (GLUCOPHAGE) 500 MG tablet Take 1 tablet (500 mg total) by mouth 2 (two) times daily with a meal. 12/25/17  Yes End, Harrell Gave, MD  Multiple Vitamins-Minerals (MACULAR VITAMIN BENEFIT PO) Take 1 tablet by mouth daily.   Yes [provider]  nitroGLYCERIN (NITROSTAT) 0.4 MG SL tablet Place 1 tablet (0.4 mg total) under the tongue every 5 (five) minutes as needed for chest pain. 12/21/17  Yes End, Harrell Gave, MD  Omega-3 Fatty Acids (FISH OIL) 1200 MG CAPS Take 1,200 mg by mouth 3 (three) times daily.    Yes [provider]  omeprazole (PRILOSEC) 40 MG capsule Take 1 capsule (40 mg total) by mouth daily. Patient taking differently: Take 40 mg by mouth daily at 12 noon.  11/15/17  Yes Ria Bush, MD  simvastatin (ZOCOR) 20 MG tablet TAKE ONE TABLET BY MOUTH EVERY NIGHT AT BEDTIME 11/22/16  Yes Ria Bush, MD  sitaGLIPtin (JANUVIA) 50 MG tablet Take 1 tablet (50 mg total) by mouth daily. 06/14/17  Yes Ria Bush, MD  furosemide (LASIX) 20 MG tablet Take 1 tablet (20 mg total) by mouth daily. 01/14/18   End, Harrell Gave, MD    I have reviewed the patient's current medications.  Labs:  Results for orders placed or performed during the hospital encounter of 01/18/18 (from the past 48 hour(s))  Glucose, capillary     Status: Abnormal   Collection Time: 01/20/18  3:40 PM  Result Value Ref  Range   Glucose-Capillary 145 (H) 65 - 99 mg/dL   Comment 1 Capillary Specimen    Comment 2 Notify RN   I-STAT, chem 8     Status: Abnormal   Collection Time: 01/20/18  4:34 PM  Result Value Ref Range   Sodium 138 135 - 145 mmol/L   Potassium 4.0 3.5 - 5.1 mmol/L   Chloride 101 101 - 111 mmol/L   BUN 33 (H) 6 - 20 mg/dL   Creatinine, Ser 2.30 (H) 0.61 - 1.24 mg/dL   Glucose, Bld 176 (H) 65 - 99 mg/dL   Calcium, Ion 1.11 (L) 1.15 - 1.40 mmol/L   TCO2 23 22 - 32 mmol/L   Hemoglobin 8.2 (L) 13.0 - 17.0 g/dL   HCT 24.0 (L) 39.0 - 52.0 %  Glucose, capillary     Status: Abnormal   Collection Time: 01/20/18  8:03 PM  Result Value Ref Range   Glucose-Capillary 183 (H) 65 - 99 mg/dL   Comment 1 Notify RN   Glucose, capillary     Status: Abnormal   Collection Time: 01/20/18 11:34 PM  Result Value Ref Range   Glucose-Capillary 117 (H) 65 - 99 mg/dL   Comment 1 Notify RN   Comprehensive metabolic panel     Status: Abnormal   Collection Time: 01/21/18  3:05 AM  Result Value Ref Range   Sodium 139 135 - 145 mmol/L   Potassium 4.1 3.5 - 5.1 mmol/L   Chloride 104 101 - 111 mmol/L   CO2 24 22 - 32 mmol/L   Glucose, Bld 143 (H) 65 - 99 mg/dL   BUN 43 (H) 6 - 20 mg/dL   Creatinine, Ser 2.91 (H) 0.61 - 1.24 mg/dL   Calcium 8.0 (L) 8.9 - 10.3 mg/dL   Total Protein 5.7 (L) 6.5 - 8.1 g/dL   Albumin 2.8 (L) 3.5 - 5.0 g/dL   AST 23 15 - 41 U/L   ALT 17 17 - 63 U/L  Alkaline Phosphatase 27 (L) 38 - 126 U/L   Total Bilirubin 0.9 0.3 - 1.2 mg/dL   GFR calc non Af Amer 20 (L) >60 mL/min   GFR calc Af Amer 23 (L) >60 mL/min    Comment: (NOTE) The eGFR has been calculated using the CKD EPI equation. This calculation has not been validated in all clinical situations. eGFR's persistently <60 mL/min signify possible Chronic Kidney Disease.    Anion gap 11 5 - 15    Comment: Performed at [facility] [address], [place], [place] [address]  CBC     Status: Abnormal   Collection Time:  01/21/18  3:05 AM  Result Value Ref Range   WBC 9.5 4.0 - 10.5 K/uL   RBC 2.84 (L) 4.22 - 5.81 MIL/uL   Hemoglobin 8.5 (L) 13.0 - 17.0 g/dL   HCT 26.4 (L) 39.0 - 52.0 %   MCV 93.0 78.0 - 100.0 fL   MCH 29.9 26.0 - 34.0 pg   MCHC 32.2 30.0 - 36.0 g/dL   RDW 15.3 11.5 - 15.5 %   Platelets 170 150 - 400 K/uL    Comment: Performed at [facility], [place] [address], [place], [place] [address]  Glucose, capillary     Status: Abnormal   Collection Time: 01/21/18  3:49 AM  Result Value Ref Range   Glucose-Capillary 133 (H) 65 - 99 mg/dL   Comment 1 Notify RN   Glucose, capillary     Status: Abnormal   Collection Time: 01/21/18  7:41 AM  Result Value Ref Range   Glucose-Capillary 135 (H) 65 - 99 mg/dL   Comment 1 Capillary Specimen   Glucose, capillary     Status: Abnormal   Collection Time: 01/21/18 12:06 PM  Result Value Ref Range   Glucose-Capillary 177 (H) 65 - 99 mg/dL  I-STAT 3, arterial blood gas (G3+)     Status: Abnormal   Collection Time: 01/21/18  3:48 PM  Result Value Ref Range   pH, Arterial 7.388 7.350 - 7.450   pCO2 arterial 33.1 32.0 - 48.0 mmHg   pO2, Arterial 95.0 83.0 - 108.0 mmHg   Bicarbonate 20.0 20.0 - 28.0 mmol/L   TCO2 21 (L) 22 - 32 mmol/L   O2 Saturation 98.0 %   Acid-base deficit 5.0 (H) 0.0 - 2.0 mmol/L   Patient temperature 97.8 F    Collection site RADIAL, ALLEN'S TEST ACCEPTABLE    Drawn by RT    Sample type ARTERIAL   Glucose, capillary     Status: Abnormal   Collection Time: 01/21/18  4:11 PM  Result Value Ref Range   Glucose-Capillary 165 (H) 65 - 99 mg/dL   Comment 1 Capillary Specimen   Cooxemetry Panel (carboxy, met, total hgb, O2 sat)     Status: Abnormal   Collection Time: 01/21/18  7:00 PM  Result Value Ref Range   Total hemoglobin 8.0 (L) 12.0 - 16.0 g/dL   O2 Saturation 53.7 %   Carboxyhemoglobin 1.1 0.5 - 1.5 %   Methemoglobin 1.4 0.0 - 1.5 %  Glucose, capillary     Status: Abnormal   Collection Time: 01/21/18  9:00 PM   Result Value Ref Range   Glucose-Capillary 125 (H) 65 - 99 mg/dL   Comment 1 Notify RN   Glucose, capillary     Status: Abnormal   Collection Time: 01/21/18 10:33 PM  Result Value Ref Range   Glucose-Capillary 120 (H) 65 - 99 mg/dL  Comment 1 Notify RN   I-STAT 3, arterial blood gas (G3+)     Status: Abnormal   Collection Time: 01/22/18  3:32 AM  Result Value Ref Range   pH, Arterial 7.422 7.350 - 7.450   pCO2 arterial 31.6 (L) 32.0 - 48.0 mmHg   pO2, Arterial 67.0 (L) 83.0 - 108.0 mmHg   Bicarbonate 20.6 20.0 - 28.0 mmol/L   TCO2 22 22 - 32 mmol/L   O2 Saturation 94.0 %   Acid-base deficit 3.0 (H) 0.0 - 2.0 mmol/L   Patient temperature 98.6 F    Collection site RADIAL, ALLEN'S TEST ACCEPTABLE    Drawn by RT    Sample type ARTERIAL   CBC     Status: Abnormal   Collection Time: 01/22/18  4:29 AM  Result Value Ref Range   WBC 7.1 4.0 - 10.5 K/uL   RBC 2.68 (L) 4.22 - 5.81 MIL/uL   Hemoglobin 8.1 (L) 13.0 - 17.0 g/dL   HCT 24.6 (L) 39.0 - 52.0 %   MCV 91.8 78.0 - 100.0 fL   MCH 30.2 26.0 - 34.0 pg   MCHC 32.9 30.0 - 36.0 g/dL   RDW 15.7 (H) 11.5 - 15.5 %   Platelets 177 150 - 400 K/uL    Comment: Performed at [facility], [place] [address], [place], [place] [address]  Basic metabolic panel     Status: Abnormal   Collection Time: 01/22/18  4:29 AM  Result Value Ref Range   Sodium 136 135 - 145 mmol/L   Potassium 3.7 3.5 - 5.1 mmol/L   Chloride 100 (L) 101 - 111 mmol/L   CO2 24 22 - 32 mmol/L   Glucose, Bld 136 (H) 65 - 99 mg/dL   BUN 60 (H) 6 - 20 mg/dL   Creatinine, Ser 3.30 (H) 0.61 - 1.24 mg/dL   Calcium 7.7 (L) 8.9 - 10.3 mg/dL   GFR calc non Af Amer 17 (L) >60 mL/min   GFR calc Af Amer 20 (L) >60 mL/min    Comment: (NOTE) The eGFR has been calculated using the CKD EPI equation. This calculation has not been validated in all clinical situations. eGFR's persistently <60 mL/min signify possible Chronic Kidney Disease.    Anion gap 12 5 - 15    Comment:  Performed at [facility] [address] [place], [place] [address]  Cooxemetry Panel (carboxy, met, total hgb, O2 sat)     Status: Abnormal   Collection Time: 01/22/18  4:35 AM  Result Value Ref Range   Total hemoglobin 10.6 (L) 12.0 - 16.0 g/dL   O2 Saturation 50.7 %   Carboxyhemoglobin 1.5 0.5 - 1.5 %   Methemoglobin 0.9 0.0 - 1.5 %  Cooxemetry Panel (carboxy, met, total hgb, O2 sat)     Status: Abnormal   Collection Time: 01/22/18  5:25 AM  Result Value Ref Range   Total hemoglobin 9.6 (L) 12.0 - 16.0 g/dL   O2 Saturation 52.8 %   Carboxyhemoglobin 1.2 0.5 - 1.5 %   Methemoglobin 1.5 0.0 - 1.5 %  Glucose, capillary     Status: Abnormal   Collection Time: 01/22/18  6:38 AM  Result Value Ref Range   Glucose-Capillary 119 (H) 65 - 99 mg/dL   Comment 1 Notify RN   Glucose, capillary     Status: Abnormal   Collection Time: 01/22/18 11:58 AM  Result Value Ref Range   Glucose-Capillary 143 (H) 65 - 99 mg/dL   Comment  1 Capillary Specimen    Comment 2 Notify RN      ROS:  A comprehensive review of systems was negative except for: Constitutional: positive for fatigue also some edema noted by family member  Physical Exam: Vitals:   01/22/18 1013 01/22/18 1200  BP: 116/70   Pulse: 85   Resp: 20   Temp:  98.1 F (36.7 C)  SpO2: 97%      General: Obese, pleasant white male.  He has his eyes closed and some pursed lip breathing but really no acute distress HEENT: Pupils equal round reactive to light, extra elevations are intact, mucous membranes moist Neck: Difficult to determine JVD Heart: Regular rate and rhythm Lungs: Poor effort, coarse breath sounds bilaterally Abdomen: Obese, soft, nontender Extremities: Compression hose on.  Some pitting edema Skin: Warm and dry Neuro: Alert, nonfocal  Assessment/Plan: 77 year old white male with hypertension, diabetes, coronary artery disease, baseline stage III CKD creatinine 1.3-1.6-developed acute on chronic renal failure  in the setting of postop from CABG 5/10 1.Renal-CKD at baseline, baseline creatinine 1.3-1.6.  Preop urinalysis was clean.  Will check one now.  Nonoliguric so do not suspect obstruction.  With common things being common it is usually a hemodynamic insult/ATN in this setting.  I was able to confirm that he was on lisinopril all the way up to his surgery and that would make the kidneys a little more susceptible to injury.  Right now there are no acute indications for dialysis.  I will continue to follow urine output and labs closely.  I discussed with the patient and his family member that dialysis is a possibility before this is all said and done 2. Hypertension/volume  -is likely volume overloaded and reportedly 5 L positive.  Reportedly received Lasix recently with no significant response.  Because is not hypoxic I will not challenge him with that again unless urine output drops off further.  Lopressor only- BP for the most part OK  3. Anemia  - post op issue- supportive care per CTS 4. [place] - K 3.7   Lakresha Stifter A 01/22/2018, 1:15 PM

## 2018-01-23 ENCOUNTER — Inpatient Hospital Stay (HOSPITAL_COMMUNITY): Payer: Medicare Other

## 2018-01-23 LAB — GLUCOSE, CAPILLARY
Glucose-Capillary: 123 mg/dL — ABNORMAL HIGH (ref 65–99)
Glucose-Capillary: 140 mg/dL — ABNORMAL HIGH (ref 65–99)
Glucose-Capillary: 140 mg/dL — ABNORMAL HIGH (ref 65–99)
Glucose-Capillary: 115 mg/dL — ABNORMAL HIGH (ref 65–99)
Glucose-Capillary: 102 mg/dL — ABNORMAL HIGH (ref 65–99)

## 2018-01-23 LAB — BASIC METABOLIC PANEL
Anion gap: 11 (ref 5–15)
BUN: 65 mg/dL — ABNORMAL HIGH (ref 6–20)
CO2: 22 mmol/L (ref 22–32)
Calcium: 7.8 mg/dL — ABNORMAL LOW (ref 8.9–10.3)
Chloride: 101 mmol/L (ref 101–111)
Creatinine, Ser: 2.96 mg/dL — ABNORMAL HIGH (ref 0.61–1.24)
GFR calc Af Amer: 22 mL/min — ABNORMAL LOW (ref >60)
GFR calc non Af Amer: 19 mL/min — ABNORMAL LOW (ref >60)
Glucose, Bld: 130 mg/dL — ABNORMAL HIGH (ref 65–99)
Potassium: 3.5 mmol/L (ref 3.5–5.1)
Sodium: 134 mmol/L — ABNORMAL LOW (ref 135–145)

## 2018-01-23 LAB — COOXEMETRY PANEL
Carboxyhemoglobin: 1.6 % — ABNORMAL HIGH (ref 0.5–1.5)
Methemoglobin: 0.9 % (ref 0.0–1.5)
O2 Saturation: 57 %
Total hemoglobin: 8.1 g/dL — ABNORMAL LOW (ref 12.0–16.0)

## 2018-01-23 LAB — MAGNESIUM: Magnesium: 2.6 mg/dL — ABNORMAL HIGH (ref 1.7–2.4)

## 2018-01-23 LAB — HEPATIC FUNCTION PANEL
ALT: 16 U/L — ABNORMAL LOW (ref 17–63)
AST: 18 U/L (ref 15–41)
Albumin: 2.4 g/dL — ABNORMAL LOW (ref 3.5–5.0)
Alkaline Phosphatase: 32 U/L — ABNORMAL LOW (ref 38–126)
Bilirubin, Direct: 0.2 mg/dL (ref 0.1–0.5)
Indirect Bilirubin: 0.5 mg/dL (ref 0.3–0.9)
Total Bilirubin: 0.7 mg/dL (ref 0.3–1.2)
Total Protein: 5.5 g/dL — ABNORMAL LOW (ref 6.5–8.1)

## 2018-01-23 LAB — CBC
HCT: 24.4 % — ABNORMAL LOW (ref 39.0–52.0)
Hemoglobin: 7.9 g/dL — ABNORMAL LOW (ref 13.0–17.0)
MCH: 29.5 pg (ref 26.0–34.0)
MCHC: 32.4 g/dL (ref 30.0–36.0)
MCV: 91 fL (ref 78.0–100.0)
Platelets: 209 10*3/uL (ref 150–400)
RBC: 2.68 MIL/uL — ABNORMAL LOW (ref 4.22–5.81)
RDW: 15 % (ref 11.5–15.5)
WBC: 7 10*3/uL (ref 4.0–10.5)

## 2018-01-23 LAB — TSH: TSH: 5.764 u[IU]/mL — ABNORMAL HIGH (ref 0.350–4.500)

## 2018-01-23 MED ORDER — ACETAMINOPHEN 500 MG PO TABS
500.0000 mg | ORAL_TABLET | Freq: Four times a day (QID) | ORAL | Status: AC
  Administered 2018-01-23 (×2): 500 mg via ORAL
  Filled 2018-01-23 (×2): qty 1

## 2018-01-23 MED ORDER — POTASSIUM CHLORIDE 10 MEQ/50ML IV SOLN
10.0000 meq | INTRAVENOUS | Status: DC | PRN
  Administered 2018-01-23 (×2): 10 meq via INTRAVENOUS
  Filled 2018-01-23 (×2): qty 50

## 2018-01-23 MED ORDER — TRAMADOL HCL 50 MG PO TABS
50.0000 mg | ORAL_TABLET | Freq: Four times a day (QID) | ORAL | Status: DC | PRN
  Administered 2018-01-24 – 2018-01-27 (×8): 50 mg via ORAL
  Filled 2018-01-23 (×8): qty 1

## 2018-01-23 MED ORDER — AMIODARONE HCL 200 MG PO TABS
200.0000 mg | ORAL_TABLET | Freq: Two times a day (BID) | ORAL | Status: DC
  Administered 2018-01-23 – 2018-01-28 (×11): 200 mg via ORAL
  Filled 2018-01-23 (×11): qty 1

## 2018-01-23 MED ORDER — ACETAMINOPHEN 160 MG/5ML PO SOLN: 1000.0000 mg | Freq: Four times a day (QID) | ORAL | Status: AC

## 2018-01-23 MED ORDER — CLONAZEPAM 1 MG PO TABS
1.0000 mg | ORAL_TABLET | Freq: Every day | ORAL | Status: DC
  Administered 2018-01-23 – 2018-01-27 (×5): 1 mg via ORAL
  Filled 2018-01-23 (×5): qty 1

## 2018-01-23 MED FILL — Mannitol IV Soln 20%: INTRAVENOUS | Qty: 500 | Status: AC

## 2018-01-23 MED FILL — Sodium Chloride IV Soln 0.9%: INTRAVENOUS | Qty: 2000 | Status: AC

## 2018-01-23 MED FILL — Electrolyte-R (PH 7.4) Solution: INTRAVENOUS | Qty: 3000 | Status: AC

## 2018-01-23 MED FILL — Lidocaine HCl Local Soln Prefilled Syringe 100 MG/5ML (2%): INTRAMUSCULAR | Qty: 5 | Status: AC

## 2018-01-23 MED FILL — Sodium Bicarbonate IV Soln 8.4%: INTRAVENOUS | Qty: 50 | Status: AC

## 2018-01-23 MED FILL — Heparin Sodium (Porcine) Inj 1000 Unit/ML: INTRAMUSCULAR | Qty: 30 | Status: AC

## 2018-01-23 NOTE — Progress Notes (Signed)
5 Days Post-Op Procedure(s) (LRB): CORONARY ARTERY BYPASS GRAFTING (CABG) x 3; Using Left Internal Mammary Artery, and Right Greater Saphenous Vein harvested Endoscopically, Coronary Artery Endarterectomy (N/A) TRANSESOPHAGEAL ECHOCARDIOGRAM (TEE) (N/A) Subjective: Urgent CABG/coronary endarterectomy Postop acute renal failure, afib, poss LLL pneumonia Now nsr Urine output sl improved  Objective: Vital signs in last 24 hours: Temp:  [96.2 F (35.7 C)-98.7 F (37.1 C)] 98.2 F (36.8 C) (05/15 0400) Pulse Rate:  [69-108] 78 (05/15 0700) Cardiac Rhythm: Normal sinus rhythm (05/14 1945) Resp:  [12-30] 13 (05/15 0700) BP: (93-170)/(52-97) 137/71 (05/15 0700) SpO2:  [93 %-99 %] 99 % (05/15 0700) Weight:  [297 lb 2.9 oz (134.8 kg)] 297 lb 2.9 oz (134.8 kg) (05/15 0500)  Hemodynamic parameters for last 24 hours: CVP:  [10 mmHg-11 mmHg] 10 mmHg  Intake/Output from previous day: 05/14 0701 - 05/15 0700 In: 1056 [P.O.:480; I.V.:576] Out: 668 [Urine:666; Stool:2] Intake/Output this shift: No intake/output data recorded.       Exam    General- alert and comfortable, edematous    Neck- no JVD, no cervical adenopathy palpable, no carotid bruit   Lungs- decreased BS L base   Cor- regular rate and rhythm, no murmur , gallop   Abdomen- soft, non-tender   Extremities - warm, non-tender,2+l edema   Neuro- oriented, appropriate, no focal weakness   Lab Results: Recent Labs    01/22/18 0429 01/23/18 0526  WBC 7.1 7.0  HGB 8.1* 7.9*  HCT 24.6* 24.4*  PLT 177 209   BMET:  Recent Labs    01/22/18 0429 01/23/18 0526  NA 136 134*  K 3.7 3.5  CL 100* 101  CO2 24 22  GLUCOSE 136* 130*  BUN 60* 65*  CREATININE 3.30* 2.96*  CALCIUM 7.7* 7.8*    PT/INR: No results for input(s): LABPROT, INR in the last 72 hours. ABG    Component Value Date/Time   PHART 7.422 01/22/2018 0332   HCO3 20.6 01/22/2018 0332   TCO2 22 01/22/2018 0332   ACIDBASEDEF 3.0 (H) 01/22/2018 0332   O2SAT  57.0 01/23/2018 0500   CBG (last 3)  Recent Labs    01/22/18 1158 01/22/18 1630 01/23/18 0651  GLUCAP 143* 135* 140*    Assessment/Plan: S/P Procedure(s) (LRB): CORONARY ARTERY BYPASS GRAFTING (CABG) x 3; Using Left Internal Mammary Artery, and Right Greater Saphenous Vein harvested Endoscopically, Coronary Artery Endarterectomy (N/A) TRANSESOPHAGEAL ECHOCARDIOGRAM (TEE) (N/A) Expected postop blood loss anemia Cont low dose milrinone until renal function improves keep in ICU   LOS: 5 days    [name] Young 01/23/2018

## 2018-01-23 NOTE — Care Management Note (Signed)
Case Management Note Marvetta Gibbons RN,BSN Unit [facility] 1-22 Case Manager  [phone]  Patient Details  Name: [name] Young. MRN: [id number] Date of Birth: [birth year]  Subjective/Objective:  Pt admitted s/p Urgent CABGx3/coronary endarterectomy Postop acute renal failure, afib, poss LLL pneumonia, on IV milrinone                  Action/Plan: PTA pt lived at home with spouse- per PT eval- recommendation for CIR- will need CIR consult placed and OT eval to be considered for IP admit- MD please order if agreeable- CM to follow for transition of care needs  Expected Discharge Date:                  Expected Discharge Plan:  IP Rehab Facility  In-House Referral:  Clinical Social Work  Discharge planning Services  CM Consult  Post Acute Care Choice:    Choice offered to:     DME Arranged:    DME Agency:     HH Arranged:    [place] Agency:     Status of Service:  In process, will continue to follow  If discussed at Long Length of Stay Meetings, dates discussed:    Discharge Disposition:   Additional Comments:  Dawayne Patricia, RN 01/23/2018, 10:53 AM

## 2018-01-23 NOTE — Progress Notes (Signed)
TCTS BRIEF SICU PROGRESS NOTE  5 Days Post-Op  S/P Procedure(s) (LRB): CORONARY ARTERY BYPASS GRAFTING (CABG) x 3; Using Left Internal Mammary Artery, and Right Greater Saphenous Vein harvested Endoscopically, Coronary Artery Endarterectomy (N/A) TRANSESOPHAGEAL ECHOCARDIOGRAM (TEE) (N/A)   Stable day NSR w/ stable BP Breathing comfortably w/ O2 sats 95% on room air UOP adequate  Plan: Continue current plan  Rexene Alberts, MD 01/23/2018 7:39 PM

## 2018-01-23 NOTE — Progress Notes (Signed)
Subjective:  No signif events overnight - UOP 666 yest  but 425 this AM - crt down - BUN up slightly  Objective Vital signs in last 24 hours: Vitals:   01/23/18 0400 01/23/18 0500 01/23/18 0600 01/23/18 0700  BP: (!) 156/82 (!) 142/59 (!) 93/52 137/71  Pulse: 79 84 81 78  Resp: 18 20 17 13   Temp: 98.2 F (36.8 C)     TempSrc: Oral     SpO2: 97% 96% 99% 99%  Weight:  134.8 kg (297 lb 2.9 oz)    Height:       Weight change: 1.442 kg (3 lb 2.9 oz)  Intake/Output Summary (Last 24 hours) at 01/23/2018 5329 Last data filed at 01/23/2018 0700 Gross per 24 hour  Intake 1056 ml  Output 668 ml  Net 388 ml    Assessment/Plan: 77 year old white male with hypertension, diabetes, coronary artery disease, baseline stage III CKD creatinine 1.3-1.6-developed acute on chronic renal failure in the setting of postop from CABG 5/10 1.Renal-CKD at baseline, baseline creatinine 1.3-1.6.  Preop urinalysis was clean, post op with hematuria- no protein  Nonoliguric so do not suspect obstruction.  Suspect a hemodynamic insult/ATN in this setting.  I was able to confirm that he was on lisinopril all the way up to his surgery and that has made the kidneys a little more susceptible to injury.  Right now there are no acute indications for dialysis, possibly UOP picking up and crt trending down!!  I will continue to follow urine output and labs closely.  I discussed with the patient and his family member that dialysis is a possibility before this is all said and done but things are looking good for now 2. Hypertension/volume  -is volume overloaded and reportedly 5 L positive.  Received Lasix recently with no significant response.  Because is not hypoxic I will not challenge him with that again - now it appears that UOP is increasing on its own.  Lopressor only- BP for the most part OK  3. Anemia  - post op issue- supportive care per CTS 4. Elytes - K 3.5- being given 30 meq IV 5. ID- on Fortaz- per primary       Nishi Neiswonger A    Labs: Basic Metabolic Panel: Recent Labs  Lab 01/21/18 0305 01/22/18 0429 01/23/18 0526  NA 139 136 134*  K 4.1 3.7 3.5  CL 104 100* 101  CO2 24 24 22   GLUCOSE 143* 136* 130*  BUN 43* 60* 65*  CREATININE 2.91* 3.30* 2.96*  CALCIUM 8.0* 7.7* 7.8*   Liver Function Tests: Recent Labs  Lab 01/16/18 1337 01/21/18 0305 01/23/18 0526  AST 28 23 18   ALT 22 17 16*  ALKPHOS 26* 27* 32*  BILITOT 0.8 0.9 0.7  PROT 6.4* 5.7* 5.5*  ALBUMIN 3.6 2.8* 2.4*   No results for input(s): LIPASE, AMYLASE in the last 168 hours. No results for input(s): AMMONIA in the last 168 hours. CBC: Recent Labs  Lab 01/19/18 1621  01/20/18 0534  01/21/18 0305 01/22/18 0429 01/23/18 0526  WBC 10.1  --  10.9*  --  9.5 7.1 7.0  HGB 8.9*   < > 8.9*   < > 8.5* 8.1* 7.9*  HCT 28.1*   < > 28.3*   < > 26.4* 24.6* 24.4*  MCV 92.4  --  93.1  --  93.0 91.8 91.0  PLT 141*  --  160  --  170 177 209   < > = values in this  interval not displayed.   Cardiac Enzymes: No results for input(s): CKTOTAL, CKMB, CKMBINDEX, TROPONINI in the last 168 hours. CBG: Recent Labs  Lab 01/21/18 2233 01/22/18 0638 01/22/18 1158 01/22/18 1630 01/23/18 0651  GLUCAP 120* 119* 143* 135* 140*    Iron Studies: No results for input(s): IRON, TIBC, TRANSFERRIN, FERRITIN in the last 72 hours. Studies/Results: Dg Chest Port 1 View  Result Date: 01/22/2018 CLINICAL DATA:  Follow-up post CABG EXAM: PORTABLE CHEST 1 VIEW COMPARISON:  Chest x-rays dated 01/21/2018 and 01/20/2018. FINDINGS: Stable cardiomegaly. Vague opacity at the LEFT lung base, stable, probable atelectasis and/or small pleural effusion. Opacity within the LEFT upper lung is stable, corresponding to the site of a previous chest tube placement. No new lung findings. No pneumothorax seen. RIGHT HA sheath remains in place. No acute or suspicious osseous finding. IMPRESSION: No change compared to yesterday's chest x-ray. Probable  atelectasis and/or small pleural effusion at the LEFT lung base. Stable cardiomegaly. Electronically Signed   By: Franki Cabot M.D.   On: 01/22/2018 16:08   Medications: Infusions: . sodium chloride    . amiodarone 30 mg/hr (01/22/18 2116)  . cefTAZidime (FORTAZ)  IV Stopped (01/22/18 2145)  . milrinone 0.25 mcg/kg/min (01/22/18 1812)  . potassium chloride      Scheduled Medications: . acetaminophen  1,000 mg Oral Q6H   Or  . acetaminophen (TYLENOL) oral liquid 160 mg/5 mL  1,000 mg Per Tube Q6H  . amiodarone  400 mg Oral Q12H   Followed by  . [START ON 01/30/2018] amiodarone  400 mg Oral Daily  . aspirin EC  81 mg Oral Daily  . bisacodyl  10 mg Oral Daily   Or  . bisacodyl  10 mg Rectal Daily  . clopidogrel  75 mg Oral Daily  . docusate sodium  200 mg Oral Daily  . enoxaparin (LOVENOX) injection  30 mg Subcutaneous Q24H  . guaiFENesin  600 mg Oral BID  . insulin aspart  0-24 Units Subcutaneous TID AC & HS  . insulin detemir  14 Units Subcutaneous BID  . metoCLOPramide (REGLAN) injection  10 mg Intravenous Q6H  . metoprolol tartrate  12.5 mg Oral BID   Or  . metoprolol tartrate  12.5 mg Per Tube BID  . pantoprazole  40 mg Oral Daily  . pneumococcal 23 valent vaccine  0.5 mL Intramuscular Tomorrow-1000  . simvastatin  20 mg Oral QHS  . sodium chloride flush  3 mL Intravenous Q12H  . sodium chloride flush  3 mL Intravenous Q12H    have reviewed scheduled and prn medications.  Physical Exam: General: sitting up in chair- looks better Heart: RRR Lungs: mostly clear Abdomen: obese, soft Extremities: pitting edema    01/23/2018,7:21 AM  LOS: 5 days

## 2018-01-24 DIAGNOSIS — R5381 Other malaise: Secondary | ICD-10-CM

## 2018-01-24 DIAGNOSIS — Z951 Presence of aortocoronary bypass graft: Secondary | ICD-10-CM

## 2018-01-24 LAB — COOXEMETRY PANEL
Carboxyhemoglobin: 1.1 % (ref 0.5–1.5)
Methemoglobin: 1.4 % (ref 0.0–1.5)
O2 Saturation: 49.8 %
Total hemoglobin: 11.2 g/dL — ABNORMAL LOW (ref 12.0–16.0)

## 2018-01-24 LAB — CBC
HCT: 23.6 % — ABNORMAL LOW (ref 39.0–52.0)
Hemoglobin: 7.7 g/dL — ABNORMAL LOW (ref 13.0–17.0)
MCH: 30 pg (ref 26.0–34.0)
MCHC: 32.6 g/dL (ref 30.0–36.0)
MCV: 91.8 fL (ref 78.0–100.0)
Platelets: 238 10*3/uL (ref 150–400)
RBC: 2.57 MIL/uL — ABNORMAL LOW (ref 4.22–5.81)
RDW: 15.1 % (ref 11.5–15.5)
WBC: 6.9 10*3/uL (ref 4.0–10.5)

## 2018-01-24 LAB — BASIC METABOLIC PANEL
Anion gap: 10 (ref 5–15)
BUN: 61 mg/dL — ABNORMAL HIGH (ref 6–20)
CO2: 25 mmol/L (ref 22–32)
Calcium: 8.3 mg/dL — ABNORMAL LOW (ref 8.9–10.3)
Chloride: 102 mmol/L (ref 101–111)
Creatinine, Ser: 2.06 mg/dL — ABNORMAL HIGH (ref 0.61–1.24)
GFR calc Af Amer: 35 mL/min — ABNORMAL LOW (ref >60)
GFR calc non Af Amer: 30 mL/min — ABNORMAL LOW (ref >60)
Glucose, Bld: 123 mg/dL — ABNORMAL HIGH (ref 65–99)
Potassium: 3.8 mmol/L (ref 3.5–5.1)
Sodium: 137 mmol/L (ref 135–145)

## 2018-01-24 LAB — GLUCOSE, CAPILLARY
Glucose-Capillary: 138 mg/dL — ABNORMAL HIGH (ref 65–99)
Glucose-Capillary: 118 mg/dL — ABNORMAL HIGH (ref 65–99)
Glucose-Capillary: 120 mg/dL — ABNORMAL HIGH (ref 65–99)

## 2018-01-24 MED ORDER — ACETAMINOPHEN 500 MG PO TABS
750.0000 mg | ORAL_TABLET | Freq: Four times a day (QID) | ORAL | Status: DC | PRN
  Administered 2018-01-24 – 2018-01-26 (×4): 750 mg via ORAL
  Filled 2018-01-24 (×4): qty 2

## 2018-01-24 NOTE — PMR Pre-admission (Addendum)
PMR Admission Coordinator Pre-Admission Assessment  Patient: [name] Young. is an 77 y.o., male MRN: [id number] DOB: 12/18/1940  Height: 5\' 10"  (177.8 cm) Weight: 132.8 kg (292 lb 12.3 oz)              Insurance Information HMO:     PPO: yes     PCP:      IPA:      80/20:      OTHER: medicare advantage plan PRIMARY: [employer] Medicare      Policy#: [id number]      Subscriber: pt CM Name: [name] Young      Phone#: [phone]     Fax#: [phone] Pre-Cert#: [id number]  F/u with Vevelyn Royals ; approved for 7 days     Employer: retired Benefits:  Phone #: [phone]     Name: 01/24/2018 Eff. Date: 09/11/2017     Deduct: none      Out of Pocket Max: $4000      Life Max: none CIR: $160 co pay per day days 1 until 10      SNF: no co pay days 1 until 20; $50 co pay per day days 21 until 100 Outpatient: $20 co pay per visit     Co-Pay: visits per medical neccesity Home Health: 100%      Co-Pay: visits per medical neccesity DME: 80%     Co-Pay: 20% Providers: in network  SECONDARY: none        Medicaid Application Date:       Case Manager:  Disability Application Date:       Case Worker:   Emergency Facilities manager Information    Name Relation Home Work Mobile   [name] Young Spouse [phone]  [phone]     Current Medical History  Patient Admitting Diagnosis: debility after CABG  History of Present Illness: HPI: [name] Deroy. Ewan, [name] is a 77 year old right-handed male with past medical history of hypertension, diabetes mellitus, CKD stage III.  Presented 01/18/2018 with increasing dyspnea on exertion and has been followed by cardiothoracic surgery with recent echocardiogram showing ejection fraction of 45% no significant regurgitation as well as chest x-ray unremarkable.  EKG was nonspecific.  Cardiac catheterization completed demonstrating chronic occlusion of the RCA with high-grade LAD diagonal stenosis.  Underwent CABG x3 01/18/2018 per Dr. Prescott Gum.   Hospital course pain management with sternal precautions.  Nephrology consulted 01/22/2018 for elevating creatinine postoperatively 3.3 from a baseline 1.6.  No acute indications at this time for dialysis.  Creatinine stabilizing 1.62 and nephrology services have signed off.  Acute blood loss anemia 7.3 and monitored after being transfused and latest hemoglobin 9.6.  Currently maintained on aspirin and Plavix after CABG. initially required milrinone for blood pressure control which is since been weaned off.  Subcutaneous Lovenox for DVT prophylaxis.  Tolerating a regular diet.     Past Medical History  Past Medical History:  Diagnosis Date  . Arthritis   . Chronic kidney disease    kidney stones  . Coronary artery disease   . Dyspnea   . Dysrhythmia   . GERD (gastroesophageal reflux disease)   . History of cholelithiasis   . History of kidney stones    ca ox [name] @ [facility]) now [facility]  . History of pneumonia   . HLD (hyperlipidemia)   . HTN (hypertension)   . Jaundice    age 78  . Pneumonia    years ago   . T2DM (type 2 diabetes mellitus) ([place])  2010    Family History  family history includes Alzheimer's disease in his mother; Atrial fibrillation in his mother; Breast cancer in his mother; CAD in his father and mother; Colon cancer (age of onset: 67) in his maternal grandfather; Diabetes in his father; Rectal cancer in his maternal grandfather; Stroke (age of onset: 61) in his father.  Prior Rehab/Hospitalizations:  Has the patient had major surgery during 100 days prior to admission? No  Current Medications   Current Facility-Administered Medications:  .  0.9 %  sodium chloride infusion, 250 mL, Intravenous, PRN, [name], MD .  acetaminophen (TYLENOL) tablet 650 mg, 650 mg, Oral, Q6H PRN, Barrett, Erin R, PA-C, 650 mg at 01/28/18 0917 .  amiodarone (PACERONE) tablet 200 mg, 200 mg, Oral, BID, [name], MD, 200 mg at 01/28/18 0911 .  aspirin EC tablet  81 mg, 81 mg, Oral, Daily, [name], MD, 81 mg at 01/28/18 0912 .  bisacodyl (DULCOLAX) EC tablet 10 mg, 10 mg, Oral, Daily PRN **OR** bisacodyl (DULCOLAX) suppository 10 mg, 10 mg, Rectal, Daily PRN, [name], MD .  clonazePAM Bobbye Charleston) tablet 1 mg, 1 mg, Oral, QHS, [name], MD, 1 mg at 01/27/18 2128 .  clopidogrel (PLAVIX) tablet 75 mg, 75 mg, Oral, Daily, [name], MD, 75 mg at 01/28/18 0911 .  enoxaparin (LOVENOX) injection 30 mg, 30 mg, Subcutaneous, Q24H, [name], MD, 30 mg at 01/27/18 2129 .  furosemide (LASIX) tablet 40 mg, 40 mg, Oral, Daily, [name], MD, 40 mg at 01/28/18 0911 .  guaiFENesin (MUCINEX) 12 hr tablet 600 mg, 600 mg, Oral, BID, [name], MD, 600 mg at 01/28/18 0912 .  CBG monitoring, , , 4x Daily, AC & HS **AND** insulin aspart (novoLOG) injection 0-24 Units, 0-24 Units, Subcutaneous, TID AC & HS, [name], MD, 2 Units at 01/28/18 1158 .  insulin detemir (LEVEMIR) injection 14 Units, 14 Units, Subcutaneous, BID, [name], MD, 14 Units at 01/28/18 0911 .  metoprolol tartrate (LOPRESSOR) tablet 12.5 mg, 12.5 mg, Oral, BID, [name], MD, 12.5 mg at 01/28/18 0911 .  ondansetron (ZOFRAN) tablet 4 mg, 4 mg, Oral, Q6H PRN **OR** ondansetron (ZOFRAN) injection 4 mg, 4 mg, Intravenous, Q6H PRN, [name], MD .  pantoprazole (PROTONIX) EC tablet 40 mg, 40 mg, Oral, QAC breakfast, [name], MD, 40 mg at 01/28/18 0911 .  simvastatin (ZOCOR) tablet 20 mg, 20 mg, Oral, QHS, [name], MD, 20 mg at 01/27/18 2129 .  sodium chloride flush (NS) 0.9 % injection 3 mL, 3 mL, Intravenous, Q12H, [name], MD, 3 mL at 01/28/18 1043 .  sodium chloride flush (NS) 0.9 % injection 3 mL, 3 mL, Intravenous, PRN, [name], MD .  traMADol Veatrice Bourbon) tablet 50 mg, 50 mg, Oral, Q6H PRN, [name], MD, 50 mg at 01/27/18 2263  Patients Current Diet:  Diet  Order           Diet heart healthy/carb modified Room service appropriate? Yes; Fluid consistency: Thin  Diet effective now          Precautions / Restrictions Precautions Precautions: Fall, Sternal Precaution Booklet Issued: No Precaution Comments: Pt requires mod to max cues to adhere to sternal precautions.  Pt was instructed to avoid pushing through his arms.  Restrictions Weight Bearing Restrictions: Yes Other Position/Activity Restrictions: sternal precautions   Has the patient had 2 or more falls or  a fall with injury in the past year?No  Prior Activity Level Limited Community (1-2x/wk): Independent without AD; becoming more SOB with short distances pta  [place] / [place] Devices/Equipment: Blood pressure cuff, Cane (specify quad or straight), CBG Meter, Eyeglasses, Raised toilet seat with rails, Scales Home Equipment: Walker - 2 wheels, Bedside commode, Other (comment)(lift chair )  Prior Device Use: Indicate devices/aids used by the patient prior to current illness, exacerbation or injury? 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  He does report he fatigued more quickly than normal lately   Self Care: Did the patient need help bathing, dressing, using the toilet or eating?  Independent  Indoor Mobility: Did the patient need assistance with walking from room to room (with or without device)? Independent  Stairs: Did the patient need assistance with internal or external stairs (with or without device)? Independent  Functional Cognition: Did the patient need help planning regular tasks such as shopping or remembering to take medications? Independent  Current Functional Level Cognition  Overall Cognitive Status: Impaired/Different from baseline Orientation Level: Oriented X4 General  Comments: Pt requires mod to max cues to follow precautions     Extremity Assessment (includes Sensation/Coordination)  Upper Extremity Assessment: Generalized weakness(bil. UEs tremulous )  Lower Extremity Assessment: Defer to PT evaluation    ADLs  Overall ADL's : Needs assistance/impaired Eating/Feeding: Independent Grooming: Oral care, Wash/dry face, Wash/dry hands, Set up, Sitting Upper Body Bathing: Minimal assistance, Sitting Lower Body Bathing: Maximal assistance, Sit to/from stand Lower Body Bathing Details (indicate cue type and reason): assist below knees and for buttocks  Upper Body Dressing : Minimal assistance, Sitting Lower Body Dressing: Total assistance, Sit to/from stand Lower Body Dressing Details (indicate cue type and reason): Pt unablet to access bil. feet  Toilet Transfer: +2 for safety/equipment, Moderate assistance, +2 for physical assistance, Ambulation, Comfort height toilet, RW Toilet Transfer Details (indicate cue type and reason): requires mod A +2 to boost into standing  Toileting- Clothing Manipulation and Hygiene: Maximal assistance, Sit to/from stand Functional mobility during ADLs: Moderate assistance, +2 for physical assistance, +2 for safety/equipment, Rolling walker    Mobility  Overal bed mobility: Needs Assistance Bed Mobility: Rolling, Sidelying to Sit Rolling: +2 for safety/equipment, Min assist Sidelying to sit: Mod assist, +2 for safety/equipment Sit to sidelying: Mod assist, +2 for physical assistance General bed mobility comments: Pt required step by step cues for technique, as well as assist to move LEs onto  bed.    Transfers  Overall transfer level: Needs assistance Equipment used: Rolling walker (2 wheeled) Transfers: Sit to/from Stand, [employer] Transfers Sit to Stand: Mod assist, Min assist Stand pivot transfers: Min assist, +2 safety/equipment General transfer comment: mod verbal cues for precautions and hand placement.  Min A  to move into standing position, and min A to maneuver RW     Ambulation / Gait / Stairs / Wheelchair Mobility  Ambulation/Gait Ambulation/Gait assistance: Min assist, Min guard, +2 safety/equipment Ambulation Distance (Feet): 210 Feet Assistive device: Rolling walker (2 wheeled) Gait Pattern/deviations: Step-through pattern, Decreased stride length, Wide base of support, Trunk flexed, Drifts right/left, Antalgic General Gait Details: pt ambulating with min to min  guard with chair follow for safety, using RW with cues not to rely heavily on BUE support, cues for correcting flexed posture. As pt fatigued, he did  lean a little more on his UEs.  Pt O2 sats were 88-92 % on RA.  Had to cue for pursed lip breathing and take standing rest breaks when pt would desat.   Needed assist to steer RW at times and stay close to RW as well.   Gait velocity: decreased Gait velocity interpretation: <1.31 ft/sec, indicative of household ambulator    Posture / Balance Dynamic Sitting Balance Sitting balance - Comments: static sitting with supervision  Balance Overall balance assessment: Needs assistance Sitting-balance support: Feet supported, No upper extremity supported Sitting balance-Leahy Scale: Fair Sitting balance - Comments: static sitting with supervision  Standing balance support: Bilateral upper extremity supported Standing balance-Leahy Scale: Poor Standing balance comment: dependent on Use of RW/UE support as well as external support for stability.     Special needs/care consideration BiPAP/CPAP  N/a CPM n/a Continuous Drip IV  N/a Dialysis  N/a Life Vest  N/a Oxygen  N/a Special Bed  N/a Trach Size  N/a Wound Vac n/a Skin surgical incisions; old chest tube sites and pacing wire sites Bowel mgmt: continent LBM 5/18 Bladder mgmt: continent Diabetic mgmt yes pta   Previous Home Environment Living Arrangements: Spouse/significant other  Lives With: Spouse Available Help at Discharge:  Family, Available 24 hours/day Type of Home: House Home Layout: One level Home Access: Stairs to enter Entrance Stairs-Rails: None Technical brewer of Steps: 3 Bathroom Shower/Tub: Public librarian, Architectural technologist: Handicapped height Bathroom Accessibility: Yes How Accessible: Accessible via walker [place]: No Additional Comments: was indep PTA  Discharge Living Setting Plans for Discharge Living Setting: Patient's home, Lives with (comment)(wife) Type of Home at Discharge: House Discharge Home Layout: One level Discharge Home Access: Stairs to enter Entrance Stairs-Rails: None(brick wall to the right of steps) Entrance Stairs-Number of Steps: 3 Discharge Bathroom Shower/Tub: Tub/shower unit, Curtain Discharge Bathroom Toilet: Handicapped height Discharge Bathroom Accessibility: Yes How Accessible: Accessible via walker Does the patient have any problems obtaining your medications?: No  Social/Family/Support Systems Patient Roles: Spouse, Parent Contact Information: Barnetta Chapel, wife and daughter, Anne Ng Anticipated Caregiver: wife Anticipated Ambulance person Information: see above Ability/Limitations of Caregiver: none; daughter, Anne Ng, is a Marine scientist at [employer] Availability: 24/7 Discharge Plan Discussed with Primary Caregiver: Yes Is Caregiver In Agreement with Plan?: No Does Caregiver/Family have Issues with Lodging/Transportation while Pt is in Rehab?: No   Wife very assertive and direct in conversations.  Goals/Additional Needs Patient/Family Goal for Rehab: Mod I with PT and OT Expected length of stay: ELOS 5 to 9 days Pt/Family Agrees to Admission and willing to participate: Yes Program Orientation Provided & Reviewed with Pt/Caregiver Including Roles  & Responsibilities: Yes  Decrease burden of Care through IP rehab admission: n/a  Possible need for SNF placement upon discharge: not anticipated  Patient Condition: This  patient's medical and functional status has changed since the consult dated: 01/24/2018 in which the Rehabilitation Physician determined and documented that the patient's condition is appropriate for intensive rehabilitative care in an inpatient rehabilitation facility. See "History of Present Illness" (above) for medical update. Functional changes are: overall min to mod assist. Patient's medical and functional status update has been discussed with the Rehabilitation physician and patient remains appropriate for inpatient rehabilitation. Will admit to inpatient rehab today.  Preadmission Screen Completed By:   Cleatrice Burke, 01/28/2018 2:28 PM ______________________________________________________________________   Discussed status with Dr. Posey Pronto on 01/28/2018 at  1428 and received telephone approval for admission today.  Admission Coordinator:  Cleatrice Burke,  time 1428 Date 01/28/2018

## 2018-01-24 NOTE — Progress Notes (Signed)
Occupational Therapy Treatment Patient Details Name: [name] Young. MRN: [id number] DOB: December 17, 1940 Today's Date: 01/24/2018    History of present illness [name] Young. is a 77 y.o. male with past medical history significant for hypertension, diabetes mellitus, hyperlipidemia as well as history of kidney stones and chronic kidney disease.  Seems most recent creatinine is between 1.3 and 1.6.  He presented with shortness of breath-stress test was abnormal, EF 45 % cardiac catheterization demonstrated moderate diffuse vascular disease so underwent an elective coronary artery bypass grafting on 5/10 per Dr. Darcey Nora.     OT comments  Assisted RN with transfer of pt to recliner.  He requires mod verbal cues to adhere to sternal precautions, often disregarding them even when instruction provided.  He requires mod cues for correct technique when moving sidelying to sit.  He was able to move sit to stand with less assist from the bed, but requires assist to maneuver RW safely.  Will continue to follow   Follow Up Recommendations  CIR;Supervision/Assistance - 24 hour    Equipment Recommendations  3 in 1 bedside commode;Tub/shower bench    Recommendations for Other Services      Precautions / Restrictions Precautions Precautions: Fall;Sternal Precaution Booklet Issued: No Precaution Comments: Pt requires max cues to adhere to sternal precautions.  Pt was instructed to avoid pushing through his arms, but then continued to do it anyway, disregarding insruction.    Restrictions Weight Bearing Restrictions: Yes Other Position/Activity Restrictions: sternal precautions       Mobility Bed Mobility Overal bed mobility: Needs Assistance Bed Mobility: Rolling;Sidelying to Sit Rolling: +2 for safety/equipment;Min assist Sidelying to sit: Mod assist;+2 for safety/equipment     Sit to sidelying: Mod assist;+2 for physical assistance General bed mobility comments: Pt required step by  step cues for technique, as well as assist to move LEs off bed and to lift trunk   Transfers Overall transfer level: Needs assistance Equipment used: Rolling walker (2 wheeled) Transfers: Sit to/from [facility] Sit to Stand: Min assist;+2 safety/equipment Stand pivot transfers: Min assist;+2 safety/equipment       General transfer comment: mod verbal cues for precautions and hand placement.  Min A to move into standing position, and min A to maneuver RW     Balance Overall balance assessment: Needs assistance Sitting-balance support: Feet supported Sitting balance-Leahy Scale: Fair Sitting balance - Comments: static sitting with supervision    Standing balance support: Bilateral upper extremity supported Standing balance-Leahy Scale: Poor Standing balance comment: dependent on Use of eva wlaker                           ADL either performed or assessed with clinical judgement   ADL Overall ADL's : Needs assistance/impaired                                             Vision       Perception     Praxis      Cognition Arousal/Alertness: Awake/alert Behavior During Therapy: Restless Overall Cognitive Status: Impaired/Different from baseline Area of Impairment: Memory                     Memory: Decreased recall of precautions         General Comments: Pt requires max cues to follow  precautions         Exercises     Shoulder Instructions       General Comments VSS. wife present    Pertinent Vitals/ Pain       Pain Assessment: Faces Pain Score: 9  Faces Pain Scale: Hurts little more Pain Location: buttocks, chest  Pain Descriptors / Indicators: Grimacing Pain Intervention(s): Repositioned  Home Living                                          Prior Functioning/Environment              Frequency  Min 2X/week        Progress Toward Goals  OT Goals(current goals can  now be found in the care plan section)  Progress towards OT goals: Progressing toward goals  Acute Rehab OT Goals Patient Stated Goal: to regain independence and to hunt   Plan Discharge plan remains appropriate    Co-evaluation      Reason for Co-Treatment: For patient/therapist safety;To address functional/ADL transfers PT goals addressed during session: Mobility/safety with mobility;Balance;Strengthening/ROM;Proper use of DME OT goals addressed during session: ADL's and self-care;Proper use of Adaptive equipment and DME;Strengthening/ROM      AM-PAC PT "6 Clicks" Daily Activity     Outcome Measure   Help from another person eating meals?: None Help from another person taking care of personal grooming?: A Little Help from another person toileting, which includes using toliet, bedpan, or urinal?: A Lot Help from another person bathing (including washing, rinsing, drying)?: A Lot Help from another person to put on and taking off regular upper body clothing?: A Little Help from another person to put on and taking off regular lower body clothing?: Total 6 Click Score: 15    End of Session Equipment Utilized During Treatment: Rolling walker  OT Visit Diagnosis: Unsteadiness on feet (R26.81);Pain Pain - part of body: (chest, buttocks)   Activity Tolerance Patient tolerated treatment well   Patient Left in chair;with call bell/phone within reach;with family/visitor present;with nursing/sitter in room   Nurse Communication Mobility status        Time: 3790-2409 OT Time Calculation (min): 8 min  Charges: OT General Charges $OT Visit: 1 Visit OT Treatments $Therapeutic Activity: 8-22 mins  [facility], OTR/L [phone]    Lucille Passy M 01/24/2018, 10:15 PM

## 2018-01-24 NOTE — Progress Notes (Addendum)
      [address]       [place] [address]             [phone]      6 Days Post-Op Procedure(s) (LRB): CORONARY ARTERY BYPASS GRAFTING (CABG) x 3; Using Left Internal Mammary Artery, and Right Greater Saphenous Vein harvested Endoscopically, Coronary Artery Endarterectomy (N/A) TRANSESOPHAGEAL ECHOCARDIOGRAM (TEE) (N/A) Subjective: No issues overnight. Sleepy this morning. Feeling better. + bowel movement day before yesterday.   Objective: Vital signs in last 24 hours: Temp:  [98 F (36.7 C)-98.3 F (36.8 C)] 98 F (36.7 C) (05/16 0818) Pulse Rate:  [72-86] 86 (05/16 0802) Cardiac Rhythm: Normal sinus rhythm (05/16 0400) Resp:  [13-30] 23 (05/16 0802) BP: (119-153)/(57-80) 150/79 (05/16 0802) SpO2:  [92 %-100 %] 96 % (05/16 0802) Weight:  [295 lb 10.2 oz (134.1 kg)] 295 lb 10.2 oz (134.1 kg) (05/16 0600)  Hemodynamic parameters for last 24 hours: CVP:  [13 mmHg-18 mmHg] 13 mmHg  Intake/Output from previous day: 05/15 0701 - 05/16 0700 In: 375.2 [I.V.:175.2; IV Piggyback:200] Out: 1310 [Urine:1310] Intake/Output this shift: Total I/O In: -  Out: 110 [Urine:110]  General appearance: alert, cooperative and no distress Heart: regular rate and rhythm, S1, S2 normal, no murmur, click, rub or gallop Lungs: clear to auscultation bilaterally Abdomen: soft, non-tender; bowel sounds normal; no masses,  no organomegaly Extremities: +edema in lower and upper extremity Wound: clean and dry  Lab Results: Recent Labs    01/23/18 0526 01/24/18 0444  WBC 7.0 6.9  HGB 7.9* 7.7*  HCT 24.4* 23.6*  PLT 209 238   BMET:  Recent Labs    01/23/18 0526 01/24/18 0444  NA 134* 137  K 3.5 3.8  CL 101 102  CO2 22 25  GLUCOSE 130* 123*  BUN 65* 61*  CREATININE 2.96* 2.06*  CALCIUM 7.8* 8.3*    PT/INR: No results for input(s): LABPROT, INR in the last 72 hours. ABG    Component Value Date/Time   PHART 7.422 01/22/2018 0332   HCO3 20.6 01/22/2018 0332   TCO2 22 01/22/2018 0332   ACIDBASEDEF 3.0 (H) 01/22/2018 0332   O2SAT 49.8 01/24/2018 0450   CBG (last 3)  Recent Labs    01/23/18 1142 01/23/18 1621 01/23/18 2253  GLUCAP 140* 115* 102*    Assessment/Plan: S/P Procedure(s) (LRB): CORONARY ARTERY BYPASS GRAFTING (CABG) x 3; Using Left Internal Mammary Artery, and Right Greater Saphenous Vein harvested Endoscopically, Coronary Artery Endarterectomy (N/A) TRANSESOPHAGEAL ECHOCARDIOGRAM (TEE) (N/A)  1. CV-BP climbing, will allow for renal perfusion. NSR in the 80s. Continue Amio 200mg  BID. Remains on Milrinone 0.145mcg/kg/min, coox 49.8. Continue Zocor. Continue Plavix for endarterectomy.  2. Pulm-Xopenex nebs. CXR yesterday showed small left pleural effusion which has been stable. Continue use of incentive spirometer.  3. Nephrology following, baseline is 1.3-1.6. UOP increasing and creatinine trending down.  4. Endo-blood glucose well controlled, TSH elevated at 5.7 5. H and H stable, 7.7/23.6, platelets 238k 6. Continue lovenox for DVT proph.  7. Will place CIR consult per PT recommendations.   Plan: Ambulate in the halls. Continue milrinone. Continue to optimize kidney function. Keep in ICU one more day. Keep foley cath for accurate UOP.     LOS: 6 days    Elgie Collard 01/24/2018  Renal function improving Leave patient in ICU Wean milrinone  patient examined and medical record reviewed,agree with above note. Tharon Aquas Trigt III 01/24/2018

## 2018-01-24 NOTE — Progress Notes (Signed)
Physical Therapy Treatment Patient Details Name: [name] Young. MRN: [id number] DOB: [birth year] Today's Date: 01/24/2018    History of Present Illness [name] Young. is a 77 y.o. male with past medical history significant for hypertension, diabetes mellitus, hyperlipidemia as well as history of kidney stones and chronic kidney disease.  Seems most recent creatinine is between 1.3 and 1.6.  He presented with shortness of breath-stress test was abnormal, EF 45 % cardiac catheterization demonstrated moderate diffuse vascular disease so underwent an elective coronary artery bypass grafting on 5/10 per Dr. Darcey Nora.      PT Comments    Patient progressing towards goals with therapy today. Ambulated further distances than prior visit, min guard x2 with chair follow. Patient requiring mod A x2 for safe transfers, and still requiring cueing for adherence to sternal precautions in functional tasks. Agree patient could benefit from CIR still at this time.  VSS on RA this session.      Follow Up Recommendations  CIR     Equipment Recommendations  (TBD in next venue. )    Recommendations for Other Services Rehab consult     Precautions / Restrictions Precautions Precautions: Fall;Sternal Precaution Booklet Issued: No Precaution Comments: Pt requires mod cues to adhere to sternal precautions  Restrictions Weight Bearing Restrictions: Yes Other Position/Activity Restrictions: sternal precautions    Mobility  Bed Mobility Overal bed mobility: Needs Assistance Bed Mobility: Sit to Sidelying         Sit to sidelying: Mod assist;+2 for physical assistance General bed mobility comments: assist to lower trunk and to lift LEs   Transfers Overall transfer level: Needs assistance Equipment used: (EVA walker) Transfers: Sit to/from [facility] Sit to Stand: Mod assist;+2 physical assistance Stand pivot transfers: Min guard;+2 safety/equipment       General  transfer comment: mod A +2 to power into standing, cues for hand placement during transfer to adhere to sternal precautions   Ambulation/Gait Ambulation/Gait assistance: Min guard;+2 safety/equipment;+2 physical assistance Ambulation Distance (Feet): 175 Feet Assistive device: Rolling walker (2 wheeled) Gait Pattern/deviations: Step-through pattern;Decreased stride length;Wide base of support Gait velocity: decreased   General Gait Details: pt ambulating with min guard x2 for safety, using eva walker with cues not to rely heavily on BUE support, cues for correcting flexed posture. Pt O2 sats were 98% on RA. chair follow for safety. x2 standing rest breaks.    Stairs             Wheelchair Mobility    Modified Rankin (Stroke Patients Only)       Balance Overall balance assessment: Needs assistance Sitting-balance support: Feet supported;Bilateral upper extremity supported Sitting balance-Leahy Scale: Fair Sitting balance - Comments: static sitting with supervision    Standing balance support: Bilateral upper extremity supported Standing balance-Leahy Scale: Poor Standing balance comment: dependent on Use of eva wlaker                            Cognition Arousal/Alertness: Awake/alert Behavior During Therapy: WFL for tasks assessed/performed Overall Cognitive Status: Within Functional Limits for tasks assessed                                        Exercises      General Comments General comments (skin integrity, edema, etc.): VSS. wife present      Pertinent Vitals/Pain  Pain Assessment: 0-10 Pain Score: 9  Pain Location: buttocks from sitting in the chair  Pain Descriptors / Indicators: Grimacing;Restless;Sharp;Sore Pain Intervention(s): Repositioned;Monitored during session    Home Living                      Prior Function            PT Goals (current goals can now be found in the care plan section) Acute Rehab PT  Goals Patient Stated Goal: to regain independence and to hunt  PT Goal Formulation: With patient/family Time For Goal Achievement: 02/05/18 Potential to Achieve Goals: Good Progress towards PT goals: Progressing toward goals    Frequency    Min 3X/week      PT Plan Current plan remains appropriate    Co-evaluation PT/OT/SLP Co-Evaluation/Treatment: Yes Reason for Co-Treatment: For patient/therapist safety;To address functional/ADL transfers PT goals addressed during session: Mobility/safety with mobility;Balance;Strengthening/ROM;Proper use of DME OT goals addressed during session: ADL's and self-care;Proper use of Adaptive equipment and DME;Strengthening/ROM      AM-PAC PT "6 Clicks" Daily Activity  Outcome Measure  Difficulty turning over in bed (including adjusting bedclothes, sheets and blankets)?: Unable Difficulty moving from lying on back to sitting on the side of the bed? : Unable Difficulty sitting down on and standing up from a chair with arms (e.g., wheelchair, bedside commode, etc,.)?: Unable Help needed moving to and from a bed to chair (including a wheelchair)?: A Lot Help needed walking in hospital room?: A Lot Help needed climbing 3-5 steps with a railing? : Total 6 Click Score: 8    End of Session Equipment Utilized During Treatment: Gait belt Activity Tolerance: Patient tolerated treatment well Patient left: in bed;with call bell/phone within reach;with nursing/sitter in room;with family/visitor present Nurse Communication: Mobility status PT Visit Diagnosis: Unsteadiness on feet (R26.81)     Time: 6244-6950 PT Time Calculation (min) (ACUTE ONLY): 48 min  Charges:  $Gait Training: 8-22 mins                    G Codes:      Reinaldo Berber, PT, DPT Acute Rehab Services Pager: [phone]     Reinaldo Berber 01/24/2018, 9:18 PM

## 2018-01-24 NOTE — Evaluation (Signed)
Occupational Therapy Evaluation Patient Details Name: [name] Young. MRN: [id number] DOB: 1941/02/03 Today's Date: 01/24/2018    History of Present Illness Randie Tallarico. is a 77 y.o. male with past medical history significant for hypertension, diabetes mellitus, hyperlipidemia as well as history of kidney stones and chronic kidney disease.  Seems most recent creatinine is between 1.3 and 1.6.  He presented with shortness of breath-stress test was abnormal, EF 45 % cardiac catheterization demonstrated moderate diffuse vascular disease so underwent an elective coronary artery bypass grafting on 5/10 per Dr. Darcey Nora.     Clinical Impression   Pt admitted with above. He demonstrates the below listed deficits and will benefit from continued OT to maximize safety and independence with BADLs.  Pt required mod A +2 for sit to stand, and min guard assist of 2 for ambulation with EVA walker.  He requires min A for UB ADLs and mod - total A for LB ADLs.  He lives with wife, who is supportive and was independent PTA.  Feel he would benefit from CIR to allow him to maximize safety and independence with ADLs.         Follow Up Recommendations  CIR;Supervision/Assistance - 24 hour    Equipment Recommendations  3 in 1 bedside commode;Tub/shower bench    Recommendations for Other Services       Precautions / Restrictions Precautions Precautions: Fall;Sternal Precaution Comments: Pt requires mod cues to adhere to sternal precautions  Restrictions Weight Bearing Restrictions: Yes Other Position/Activity Restrictions: sternal precautions      Mobility Bed Mobility Overal bed mobility: Needs Assistance Bed Mobility: Sit to Sidelying         Sit to sidelying: Mod assist;+2 for physical assistance General bed mobility comments: assist to lower trunk and to lift LEs   Transfers Overall transfer level: Needs assistance Equipment used: (EVA walker ) Transfers: Sit to/from  [facility] Sit to Stand: Mod assist;+2 physical assistance Stand pivot transfers: Min guard;+2 safety/equipment       General transfer comment: mod A +2 to boost into standing     Balance Overall balance assessment: Needs assistance Sitting-balance support: Feet supported;Bilateral upper extremity supported Sitting balance-Leahy Scale: Fair Sitting balance - Comments: static sitting with supervision    Standing balance support: Bilateral upper extremity supported Standing balance-Leahy Scale: Poor Standing balance comment: dependent on Use of RW                            ADL either performed or assessed with clinical judgement   ADL Overall ADL's : Needs assistance/impaired Eating/Feeding: Independent   Grooming: Oral care;Wash/dry face;Wash/dry hands;Set up;Sitting   Upper Body Bathing: Minimal assistance;Sitting   Lower Body Bathing: Maximal assistance;Sit to/from stand Lower Body Bathing Details (indicate cue type and reason): assist below knees and for buttocks  Upper Body Dressing : Minimal assistance;Sitting   Lower Body Dressing: Total assistance;Sit to/from stand Lower Body Dressing Details (indicate cue type and reason): Pt unablet to access bil. feet  Toilet Transfer: +2 for safety/equipment;Moderate assistance;+2 for physical assistance;Ambulation;Comfort height toilet;RW Armed forces technical officer Details (indicate cue type and reason): requires mod A +2 to boost into standing  Toileting- Clothing Manipulation and Hygiene: Maximal assistance;Sit to/from stand       Functional mobility during ADLs: Moderate assistance;+2 for physical assistance;+2 for safety/equipment;Rolling walker       Vision         Perception  Praxis      Pertinent Vitals/Pain Pain Assessment: 0-10 Pain Score: 9  Pain Location: buttocks from sitting in the chair  Pain Descriptors / Indicators: Grimacing;Restless;Sharp;Sore     Hand Dominance      Extremity/Trunk Assessment Upper Extremity Assessment Upper Extremity Assessment: Generalized weakness(bil. UEs tremulous )   Lower Extremity Assessment Lower Extremity Assessment: Defer to PT evaluation       Communication Communication Communication: No difficulties   Cognition Arousal/Alertness: Awake/alert Behavior During Therapy: WFL for tasks assessed/performed Overall Cognitive Status: Within Functional Limits for tasks assessed                                     General Comments  VSS.  Wife present     Exercises     Shoulder Instructions      Home Living Family/patient expects to be discharged to:: Private residence Living Arrangements: Spouse/significant other Available Help at Discharge: Family;Available 24 hours/day Type of Home: House Home Access: Stairs to enter [employer] of Steps: 3 Entrance Stairs-Rails: None Home Layout: One level     Bathroom Shower/Tub: Tub/shower unit;Curtain   Bathroom Toilet: Handicapped height Bathroom Accessibility: Yes How Accessible: Accessible via walker Home Equipment: [place] - 2 wheels;Bedside commode;Other (comment)(lift chair )      Lives With: Spouse    Prior Functioning/Environment Level of Independence: Independent        Comments: 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  He does report he fatigued more quickly than normal lately         OT Problem List: Decreased strength;Decreased activity tolerance;Impaired balance (sitting and/or standing);Decreased safety awareness;Decreased knowledge of use of DME or AE;Decreased knowledge of precautions;Obesity;Pain      OT Treatment/Interventions: Self-care/ADL training;DME and/or AE instruction;Energy conservation;Therapeutic activities;Balance training;Patient/family education    OT Goals(Current goals can be found in the care plan section) Acute Rehab OT Goals Patient Stated Goal: to  regain independence and to hunt  OT Goal Formulation: With patient Time For Goal Achievement: 02/07/18 Potential to Achieve Goals: Good ADL Goals Pt Will Perform Grooming: with supervision;standing Pt Will Perform Upper Body Bathing: with set-up;sitting Pt Will Perform Lower Body Bathing: with min assist;sit to/from stand;with adaptive equipment Pt Will Perform Upper Body Dressing: with supervision;with set-up;sitting Pt Will Perform Lower Body Dressing: sit to/from stand;with min assist;with adaptive equipment Pt Will Transfer to Toilet: with min guard assist;ambulating;regular height toilet;bedside commode Pt Will Perform Toileting - Clothing Manipulation and hygiene: with min guard assist;sit to/from stand Pt Will Perform Tub/Shower Transfer: Tub transfer;with min assist;ambulating;tub bench;rolling walker  OT Frequency: Min 2X/week   Barriers to D/C:            Co-evaluation              AM-PAC PT "6 Clicks" Daily Activity     Outcome Measure Help from another person eating meals?: None Help from another person taking care of personal grooming?: A Little Help from another person toileting, which includes using toliet, bedpan, or urinal?: A Lot Help from another person bathing (including washing, rinsing, drying)?: A Lot Help from another person to put on and taking off regular upper body clothing?: A Little Help from another person to put on and taking off regular lower body clothing?: Total 6 Click Score: 15   End of Session Equipment Utilized During Treatment: Other (comment);Gait belt(EVA walker )  Nurse Communication: Mobility status  Activity Tolerance:   Patient left: in bed;with call bell/phone within reach;with family/visitor present  OT Visit Diagnosis: Unsteadiness on feet (R26.81);Pain Pain - part of body: (buttocks )                Time: 3291-9166 OT Time Calculation (min): 34 min Charges:  OT General Charges $OT Visit: 1 Visit OT Evaluation $OT Eval  Moderate Complexity: 1 Mod G-Codes:     [facility], OTR/L [phone]   Lucille Passy M 01/24/2018, 3:02 PM

## 2018-01-24 NOTE — Consult Note (Signed)
Physical Medicine and Rehabilitation Consult Reason for Consult: Decreased functional mobility Referring Physician: Dr.Van Kerby Less   HPI: [name] Young. is a 77 y.o. right-hand male with past medical history of hypertension, diabetes mellitus, CKD stage III.  Per chart review patient was independent prior to admission living with spouse.  Presented 01/18/2018 with increasing dyspnea on exertion and has been followed by cardiothoracic surgery.  Echocardiogram with ejection fraction of 45% and no significant regurgitation as well chest x-ray unremarkable.  EKG nonspecific.  Cardiac catheterization completed demonstrating chronic occlusion of the RCA with high-grade LAD diagonal stenosis.  Underwent CABG x3 01/18/2018 per Dr. Darcey Nora.  Hospital course pain management with sternal precautions.  Nephrology consulted 01/22/2018 for elevated creatinine postoperatively 3.3 from a baseline 1.6.  No acute indications for dialysis at this time.  Creatinine is stabilized 2.06 with gentle IV fluids.  Blood loss anemia 7.7 on monitor.  Patient currently maintained on aspirin and Plavix after CABG.  Lovenox for DVT prophylaxis.  Tressie Ellis was initiated 01/20/2018 for left lower lobe pneumonia monitor.  Currently tolerating a regular diet.  Physical therapy evaluation completed 01/22/2018 recommendations medicine rehab consult.     Review of Systems  Constitutional: Negative for chills and fever.  HENT: Negative for hearing loss.   Eyes: Negative for blurred vision and double vision.  Respiratory: Positive for cough and shortness of breath.   Cardiovascular: Positive for chest pain and leg swelling.  Gastrointestinal:       GERD  Genitourinary: Positive for urgency. Negative for dysuria and flank pain.  Musculoskeletal: Positive for joint pain and myalgias.  Skin: Negative for rash.  All other systems reviewed and are negative.  Past Medical History:  Diagnosis Date  . Arthritis   . Chronic kidney  disease    kidney stones  . Coronary artery disease   . Dyspnea   . Dysrhythmia   . GERD (gastroesophageal reflux disease)   . History of cholelithiasis   . History of kidney stones    ca ox [name] @ [facility]) now [place]  . History of pneumonia   . HLD (hyperlipidemia)   . HTN (hypertension)   . Jaundice    age 62  . Pneumonia    years ago   . T2DM (type 2 diabetes mellitus) ([place]) 2010   Past Surgical History:  Procedure Laterality Date  . CARPAL TUNNEL RELEASE Bilateral   . CATARACT EXTRACTION, BILATERAL    . COLONOSCOPY  11/2012   12 adenomatous polyps, diverticulosis, rec rpt 1 yr Ardis Hughs)  . COLONOSCOPY  12/2013   3 polyps, diverticulosis, rec rpt 3 yrs Ardis Hughs)  . CORONARY ARTERY BYPASS GRAFT N/A 01/18/2018   Procedure: CORONARY ARTERY BYPASS GRAFTING (CABG) x 3; Using Left Internal Mammary Artery, and Right Greater Saphenous Vein harvested Endoscopically, Coronary Artery Endarterectomy;  Surgeon: Ivin Poot, MD;  Location: [facility];  Service: Open Heart Surgery;  Laterality: N/A;  . KNEE CARTILAGE SURGERY Left   . LEFT HEART CATH AND CORONARY ANGIOGRAPHY N/A 12/21/2017   Procedure: LEFT HEART CATH AND CORONARY ANGIOGRAPHY;  Surgeon: Nelva Bush, MD;  Location: [facility];  Service: Cardiovascular;  Laterality: N/A;  . LITHOTRIPSY    . TEE WITHOUT CARDIOVERSION N/A 01/18/2018   Procedure: TRANSESOPHAGEAL ECHOCARDIOGRAM (TEE);  Surgeon: Prescott Gum, Collier Salina, MD;  Location: [facility];  Service: Open Heart Surgery;  Laterality: N/A;  . UMBILICAL HERNIA REPAIR     with mesh   Family History  Problem Relation  Age of Onset  . Alzheimer's disease Mother   . Breast cancer Mother        breast  . Atrial fibrillation Mother   . CAD Mother   . Stroke Father 26  . Diabetes Father   . CAD Father   . Rectal cancer Maternal Grandfather        rectal  . Colon cancer Maternal Grandfather 40  . Esophageal cancer Neg Hx   . Stomach cancer Neg Hx    Social History:   reports that he has never smoked. He quit smokeless tobacco use about 16 years ago. His smokeless tobacco use included chew. He reports that he drinks alcohol. He reports that he does not use drugs. Allergies: No Known Allergies Medications Prior to Admission  Medication Sig Dispense Refill  . aspirin EC 81 MG tablet Take 1 tablet (81 mg total) by mouth daily. (Patient taking differently: Take 81 mg by mouth at bedtime. )    . carvedilol (COREG) 6.25 MG tablet Take 1 tablet (6.25 mg total) by mouth 2 (two) times daily. 180 tablet 3  . Cholecalciferol (VITAMIN D) 2000 UNITS CAPS Take 2,000 Units by mouth daily.     Marland Kitchen doxazosin (CARDURA) 2 MG tablet Take 1 tablet (2 mg total) by mouth daily. 30 tablet 3  . fenofibrate (TRICOR) 145 MG tablet TAKE 1 TABLET BY MOUTH ONCE DAILY (Patient taking differently: TAKE 1 TABLET BY MOUTH ONCE DAILY AT BEDTIME.) 30 tablet 4  . lisinopril (PRINIVIL,ZESTRIL) 20 MG tablet TAKE 1 TABLET BY MOUTH DAILY (Patient taking differently: TAKE 1 TABLET BY MOUTH DAILY AT BEDTIME.) 90 tablet 3  . metFORMIN (GLUCOPHAGE) 500 MG tablet Take 1 tablet (500 mg total) by mouth 2 (two) times daily with a meal. 180 tablet 3  . Multiple Vitamins-Minerals (MACULAR VITAMIN BENEFIT PO) Take 1 tablet by mouth daily.    . nitroGLYCERIN (NITROSTAT) 0.4 MG SL tablet Place 1 tablet (0.4 mg total) under the tongue every 5 (five) minutes as needed for chest pain. 30 tablet prn  . Omega-3 Fatty Acids (FISH OIL) 1200 MG CAPS Take 1,200 mg by mouth 3 (three) times daily.     Marland Kitchen omeprazole (PRILOSEC) 40 MG capsule Take 1 capsule (40 mg total) by mouth daily. (Patient taking differently: Take 40 mg by mouth daily at 12 Young. ) 30 capsule 11  . simvastatin (ZOCOR) 20 MG tablet TAKE ONE TABLET BY MOUTH EVERY NIGHT AT BEDTIME 90 tablet 3  . sitaGLIPtin (JANUVIA) 50 MG tablet Take 1 tablet (50 mg total) by mouth daily. 30 tablet 11  . furosemide (LASIX) 20 MG tablet Take 1 tablet (20 mg total) by mouth daily.  90 tablet 3    Home: Home Living Family/patient expects to be discharged to:: Inpatient rehab Living Arrangements: Spouse/significant other Additional Comments: was indep PTA  Functional History: Prior Function Level of Independence: Independent Comments: didn't use AD Functional Status:  Mobility: Bed Mobility General bed mobility comments: pt up in chair upon PT arrival Transfers Overall transfer level: Needs assistance Equipment used: None Transfers: Sit to/from Stand Sit to Stand: Mod assist, +2 physical assistance General transfer comment: modAx2 from lower surface, used rocking technique with pt hugging heart pillow, modAx2 to power up and steady upon standing during transition of hands Ambulation/Gait Ambulation/Gait assistance: Min assist, +2 safety/equipment Ambulation Distance (Feet): 60 Feet Assistive device: Rolling walker (2 wheeled) Gait Pattern/deviations: Step-through pattern, Decreased stride length, Wide base of support General Gait Details: slow, freq standing rest breaks,  minimal bilat UE WBing Gait velocity: slow Gait velocity interpretation: <1.8 ft/sec, indicate of risk for recurrent falls    ADL:    Cognition: Cognition Overall Cognitive Status: Within Functional Limits for tasks assessed Orientation Level: Oriented X4 Cognition Arousal/Alertness: Awake/alert Behavior During Therapy: WFL for tasks assessed/performed Overall Cognitive Status: Within Functional Limits for tasks assessed  Blood pressure (!) 150/79, pulse 86, temperature 98 F (36.7 C), temperature source Axillary, resp. rate (!) 23, height _0  (1.778 m), weight 134.1 kg (295 lb 10.2 oz), SpO2 96 %. Physical Exam  Vitals reviewed. Constitutional: He is oriented to person, place, and time. No distress.  77 year old right-handed obese male  HENT:  Head: Normocephalic.  Eyes: Pupils are equal, round, and reactive to light.  Neck: Normal range of motion.  Cardiovascular: Normal  rate.  Respiratory: Effort normal.  Chest incision cdi  GI: Soft.  Musculoskeletal: He exhibits edema (pedal).  Neurological: He is alert and oriented to person, place, and time.  Motor 4/5 UE, 3/5 HF, KE and 4/5 ADF/PF. Decreased LT distally in both legs  Skin: Skin is warm.  Psychiatric: He has a normal mood and affect.    Results for orders placed or performed during the hospital encounter of 01/18/18 (from the past 24 hour(s))  Glucose, capillary     Status: Abnormal   Collection Time: 01/23/18 11:42 AM  Result Value Ref Range   Glucose-Capillary 140 (H) 65 - 99 mg/dL   Comment 1 Capillary Specimen    Comment 2 Notify RN   Glucose, capillary     Status: Abnormal   Collection Time: 01/23/18  4:21 PM  Result Value Ref Range   Glucose-Capillary 115 (H) 65 - 99 mg/dL   Comment 1 Capillary Specimen    Comment 2 Notify RN   Glucose, capillary     Status: Abnormal   Collection Time: 01/23/18 10:53 PM  Result Value Ref Range   Glucose-Capillary 102 (H) 65 - 99 mg/dL  Basic metabolic panel     Status: Abnormal   Collection Time: 01/24/18  4:44 AM  Result Value Ref Range   Sodium 137 135 - 145 mmol/L   Potassium 3.8 3.5 - 5.1 mmol/L   Chloride 102 101 - 111 mmol/L   CO2 25 22 - 32 mmol/L   Glucose, Bld 123 (H) 65 - 99 mg/dL   BUN 61 (H) 6 - 20 mg/dL   Creatinine, Ser 2.06 (H) 0.61 - 1.24 mg/dL   Calcium 8.3 (L) 8.9 - 10.3 mg/dL   GFR calc non Af Amer 30 (L) >60 mL/min   GFR calc Af Amer 35 (L) >60 mL/min   Anion gap 10 5 - 15  CBC     Status: Abnormal   Collection Time: 01/24/18  4:44 AM  Result Value Ref Range   WBC 6.9 4.0 - 10.5 K/uL   RBC 2.57 (L) 4.22 - 5.81 MIL/uL   Hemoglobin 7.7 (L) 13.0 - 17.0 g/dL   HCT 23.6 (L) 39.0 - 52.0 %   MCV 91.8 78.0 - 100.0 fL   MCH 30.0 26.0 - 34.0 pg   MCHC 32.6 30.0 - 36.0 g/dL   RDW 15.1 11.5 - 15.5 %   Platelets 238 150 - 400 K/uL  Cooxemetry Panel (carboxy, met, total hgb, O2 sat)     Status: Abnormal   Collection Time:  01/24/18  4:50 AM  Result Value Ref Range   Total hemoglobin 11.2 (L) 12.0 - 16.0 g/dL   O2 Saturation 49.8 %  Carboxyhemoglobin 1.1 0.5 - 1.5 %   Methemoglobin 1.4 0.0 - 1.5 %   Dg Chest Port 1 View  Result Date: 01/23/2018 CLINICAL DATA:  CABG EXAM: PORTABLE CHEST 1 VIEW COMPARISON:  01/22/2018 FINDINGS: Cardiac enlargement without heart failure. Left lower lobe airspace disease and small left effusion unchanged. Right lung remains clear. Right jugular sheath unchanged in position. No pneumothorax IMPRESSION: Left lower lobe airspace disease and small left effusion stable and unchanged. Negative for heart failure. Electronically Signed   By: Franchot Gallo M.D.   On: 01/23/2018 09:55   Dg Chest Port 1 View  Result Date: 01/22/2018 CLINICAL DATA:  Follow-up post CABG EXAM: PORTABLE CHEST 1 VIEW COMPARISON:  Chest x-rays dated 01/21/2018 and 01/20/2018. FINDINGS: Stable cardiomegaly. Vague opacity at the LEFT lung base, stable, probable atelectasis and/or small pleural effusion. Opacity within the LEFT upper lung is stable, corresponding to the site of a previous chest tube placement. No new lung findings. No pneumothorax seen. RIGHT HA sheath remains in place. No acute or suspicious osseous finding. IMPRESSION: No change compared to yesterday's chest x-ray. Probable atelectasis and/or small pleural effusion at the LEFT lung base. Stable cardiomegaly. Electronically Signed   By: Franki Cabot M.D.   On: 01/22/2018 16:08     Assessment/Plan: Diagnosis: debility after CABG x3  and associated medical issues 1. Does the need for close, 24 hr/day medical supervision in concert with the patient's rehab needs make it unreasonable for this patient to be served in a less intensive setting? Yes 2. Co-Morbidities requiring supervision/potential complications: DM with peripheral neuropathy, CKD II, htn, morbid obesity 3. Due to bladder management, bowel management, safety, skin/wound care, disease  management, medication administration, pain management and patient education, does the patient require 24 hr/day rehab nursing? Yes 4. Does the patient require coordinated care of a physician, rehab nurse, PT (1-2 hrs/day, 5 days/week) and OT (1-2 hrs/day, 5 days/week) to address physical and functional deficits in the context of the above medical diagnosis(es)? Yes Addressing deficits in the following areas: balance, endurance, locomotion, strength, transferring, bowel/bladder control, bathing, dressing, feeding, grooming, toileting and psychosocial support 5. Can the patient actively participate in an intensive therapy program of at least 3 hrs of therapy per day at least 5 days per week? Yes 6. The potential for patient to make measurable gains while on inpatient rehab is excellent 7. Anticipated functional outcomes upon discharge from inpatient rehab are modified independent  with PT, modified independent with OT, n/a with SLP. 8. Estimated rehab length of stay to reach the above functional goals is: 7-10 days 9. Anticipated D/C setting: Home 10. Anticipated post D/C treatments: HH therapy and Outpatient therapy 11. Overall Rehab/Functional Prognosis: excellent  RECOMMENDATIONS: This patient's condition is appropriate for continued rehabilitative care in the following setting: CIR Patient has agreed to participate in recommended program. Yes Note that insurance prior authorization may be required for reimbursement for recommended care.  Comment: Rehab Admissions Coordinator to follow up.  Thanks,  Meredith Staggers, MD, Mellody Drown  I have personally performed a face to face diagnostic evaluation of this patient. Additionally, I have reviewed and concur with the physician assistant's documentation above.    Lavon Paganini Angiulli, PA-C 01/24/2018

## 2018-01-24 NOTE — Progress Notes (Signed)
CT surgery p.m. Rounds  Patient slowly making progress Milrinone weaned off surgical line removed Foley catheter removed Following urine output and bladder scans carefully Replace Foley catheter if no void by 11 PM or if bladder scan shows volume greater than 400 cc.

## 2018-01-24 NOTE — Progress Notes (Addendum)
I met with patient and his wife at bedside to begin discussions concerning a possible inpt rehab admit. We dicussed goals and expectations and answered questions.  I await OT eval and then will begin authorization for a possible inpt rehab admit pending insurance approval. I discussed with wife that insurance is not a guaranteed approval . We will follow up and await medical readiness for d/cancer and insurance decision. I provided pt's wife a letter reviewing insurance coverage of CIR. [phone]

## 2018-01-24 NOTE — Progress Notes (Signed)
Subjective:  No signif events overnight - UOP 1300!  - crt and BUN both down Objective Vital signs in last 24 hours: Vitals:   01/24/18 0400 01/24/18 0500 01/24/18 0600 01/24/18 0615  BP: 125/63 130/70  (!) 147/77  Pulse: 79 76  83  Resp: 16 20  19   Temp:      TempSrc:      SpO2: 97% 97%  92%  Weight:   134.1 kg (295 lb 10.2 oz)   Height:       Weight change: -0.7 kg (-1 lb 8.7 oz)  Intake/Output Summary (Last 24 hours) at 01/24/2018 0657 Last data filed at 01/24/2018 0600 Gross per 24 hour  Intake 391.9 ml  Output 1310 ml  Net -918.1 ml    Assessment/Plan: 77 year old white male with hypertension, diabetes, coronary artery disease, baseline stage III CKD creatinine 1.3-1.6-developed acute on chronic renal failure in the setting of postop from CABG 5/10 1.Renal-CKD at baseline, baseline creatinine 1.3-1.6.  Preop urinalysis was clean, post op with hematuria- no protein  Nonoliguric so do not suspect obstruction.  Suspect Young hemodynamic insult/ATN in this setting.  I was able to confirm that he was on lisinopril all the way up to his surgery and that made the kidneys Young little more susceptible to injury.  Right now there are no acute indications for dialysis,  UOP picking up and crt trending down!!  I will continue to follow urine output and labs closely.  I hope we are out of the woods in terms of dialysis 2. Hypertension/volume  -is volume overloaded and reportedly 5 L positive.  Received Lasix in the past with no significant response.  UOP is picking up on own- Because is not hypoxic I will not challenge him with lasix again yet-   Lopressor only- BP for the most part OK  3. Anemia  - post op issue- supportive care per CTS 4. Elytes - K 3.8- got repleted yest 5. ID- on Fortaz- per primary      [name] Young    Labs: Basic Metabolic Panel: Recent Labs  Lab 01/22/18 0429 01/23/18 0526 01/24/18 0444  NA 136 134* 137  K 3.7 3.5 3.8  CL 100* 101 102  CO2 24 22 25    GLUCOSE 136* 130* 123*  BUN 60* 65* 61*  CREATININE 3.30* 2.96* 2.06*  CALCIUM 7.7* 7.8* 8.3*   Liver Function Tests: Recent Labs  Lab 01/21/18 0305 01/23/18 0526  AST 23 18  ALT 17 16*  ALKPHOS 27* 32*  BILITOT 0.9 0.7  PROT 5.7* 5.5*  ALBUMIN 2.8* 2.4*   No results for input(s): LIPASE, AMYLASE in the last 168 hours. No results for input(s): AMMONIA in the last 168 hours. CBC: Recent Labs  Lab 01/20/18 0534  01/21/18 0305 01/22/18 0429 01/23/18 0526 01/24/18 0444  WBC 10.9*  --  9.5 7.1 7.0 6.9  HGB 8.9*   < > 8.5* 8.1* 7.9* 7.7*  HCT 28.3*   < > 26.4* 24.6* 24.4* 23.6*  MCV 93.1  --  93.0 91.8 91.0 91.8  PLT 160  --  170 177 209 238   < > = values in this interval not displayed.   Cardiac Enzymes: No results for input(s): CKTOTAL, CKMB, CKMBINDEX, TROPONINI in the last 168 hours. CBG: Recent Labs  Lab 01/22/18 2056 01/23/18 0651 01/23/18 1142 01/23/18 1621 01/23/18 2253  GLUCAP 123* 140* 140* 115* 102*    Iron Studies: No results for input(s): IRON, TIBC, TRANSFERRIN, FERRITIN in the last  72 hours. Studies/Results: Dg Chest Port 1 View  Result Date: 01/23/2018 CLINICAL DATA:  CABG EXAM: PORTABLE CHEST 1 VIEW COMPARISON:  01/22/2018 FINDINGS: Cardiac enlargement without heart failure. Left lower lobe airspace disease and small left effusion unchanged. Right lung remains clear. Right jugular sheath unchanged in position. No pneumothorax IMPRESSION: Left lower lobe airspace disease and small left effusion stable and unchanged. Negative for heart failure. Electronically Signed   By: Franchot Gallo M.D.   On: 01/23/2018 09:55   Dg Chest Port 1 View  Result Date: 01/22/2018 CLINICAL DATA:  Follow-up post CABG EXAM: PORTABLE CHEST 1 VIEW COMPARISON:  Chest x-rays dated 01/21/2018 and 01/20/2018. FINDINGS: Stable cardiomegaly. Vague opacity at the LEFT lung base, stable, probable atelectasis and/or small pleural effusion. Opacity within the LEFT upper lung is  stable, corresponding to the site of Young previous chest tube placement. No new lung findings. No pneumothorax seen. RIGHT HA sheath remains in place. No acute or suspicious osseous finding. IMPRESSION: No change compared to yesterday's chest x-ray. Probable atelectasis and/or small pleural effusion at the LEFT lung base. Stable cardiomegaly. Electronically Signed   By: Franki Cabot M.D.   On: 01/22/2018 16:08   Medications: Infusions: . sodium chloride    . cefTAZidime (FORTAZ)  IV Stopped (01/23/18 2325)  . milrinone 0.25 mcg/kg/min (01/23/18 2254)  . potassium chloride Stopped (01/23/18 1030)    Scheduled Medications: . amiodarone  200 mg Oral BID  . aspirin EC  81 mg Oral Daily  . bisacodyl  10 mg Oral Daily   Or  . bisacodyl  10 mg Rectal Daily  . clonazePAM  1 mg Oral QHS  . clopidogrel  75 mg Oral Daily  . docusate sodium  200 mg Oral Daily  . enoxaparin (LOVENOX) injection  30 mg Subcutaneous Q24H  . guaiFENesin  600 mg Oral BID  . insulin aspart  0-24 Units Subcutaneous TID AC & HS  . insulin detemir  14 Units Subcutaneous BID  . metoCLOPramide (REGLAN) injection  10 mg Intravenous Q6H  . metoprolol tartrate  12.5 mg Oral BID  . pantoprazole  40 mg Oral Daily  . pneumococcal 23 valent vaccine  0.5 mL Intramuscular Tomorrow-1000  . simvastatin  20 mg Oral QHS  . sodium chloride flush  3 mL Intravenous Q12H  . sodium chloride flush  3 mL Intravenous Q12H    have reviewed scheduled and prn medications.  Physical Exam: General: sitting up in chair- looks better Heart: RRR Lungs: mostly clear Abdomen: obese, soft Extremities: pitting edema    01/24/2018,6:57 AM  LOS: 6 days

## 2018-01-25 ENCOUNTER — Inpatient Hospital Stay (HOSPITAL_COMMUNITY): Payer: Medicare Other

## 2018-01-25 LAB — CBC WITH DIFFERENTIAL/PLATELET
Abs Immature Granulocytes: 0.5 10*3/uL — ABNORMAL HIGH (ref 0.0–0.1)
Basophils Absolute: 0.1 10*3/uL (ref 0.0–0.1)
Basophils Relative: 1 %
Eosinophils Absolute: 0.4 10*3/uL (ref 0.0–0.7)
Eosinophils Relative: 3 %
HCT: 32 % — ABNORMAL LOW (ref 39.0–52.0)
Hemoglobin: 10.1 g/dL — ABNORMAL LOW (ref 13.0–17.0)
Immature Granulocytes: 4 %
Lymphocytes Relative: 23 %
Lymphs Abs: 2.9 10*3/uL (ref 0.7–4.0)
MCH: 29.4 pg (ref 26.0–34.0)
MCHC: 31.6 g/dL (ref 30.0–36.0)
MCV: 93 fL (ref 78.0–100.0)
Monocytes Absolute: 1.4 10*3/uL — ABNORMAL HIGH (ref 0.1–1.0)
Monocytes Relative: 11 %
Neutro Abs: 7.4 10*3/uL (ref 1.7–7.7)
Neutrophils Relative %: 58 %
Platelets: 361 10*3/uL (ref 150–400)
RBC: 3.44 MIL/uL — ABNORMAL LOW (ref 4.22–5.81)
RDW: 15.2 % (ref 11.5–15.5)
WBC: 12.7 10*3/uL — ABNORMAL HIGH (ref 4.0–10.5)

## 2018-01-25 LAB — GLUCOSE, CAPILLARY
Glucose-Capillary: 109 mg/dL — ABNORMAL HIGH (ref 65–99)
Glucose-Capillary: 111 mg/dL — ABNORMAL HIGH (ref 65–99)
Glucose-Capillary: 113 mg/dL — ABNORMAL HIGH (ref 65–99)
Glucose-Capillary: 114 mg/dL — ABNORMAL HIGH (ref 65–99)
Glucose-Capillary: 115 mg/dL — ABNORMAL HIGH (ref 65–99)
Glucose-Capillary: 130 mg/dL — ABNORMAL HIGH (ref 65–99)

## 2018-01-25 LAB — CBC
HCT: 22.9 % — ABNORMAL LOW (ref 39.0–52.0)
Hemoglobin: 7.3 g/dL — ABNORMAL LOW (ref 13.0–17.0)
MCH: 29.4 pg (ref 26.0–34.0)
MCHC: 31.9 g/dL (ref 30.0–36.0)
MCV: 92.3 fL (ref 78.0–100.0)
Platelets: 324 10*3/uL (ref 150–400)
RBC: 2.48 MIL/uL — ABNORMAL LOW (ref 4.22–5.81)
RDW: 15.1 % (ref 11.5–15.5)
WBC: 11.3 10*3/uL — ABNORMAL HIGH (ref 4.0–10.5)

## 2018-01-25 LAB — BASIC METABOLIC PANEL
Anion gap: 11 (ref 5–15)
BUN: 54 mg/dL — ABNORMAL HIGH (ref 6–20)
CO2: 23 mmol/L (ref 22–32)
Calcium: 8.8 mg/dL — ABNORMAL LOW (ref 8.9–10.3)
Chloride: 103 mmol/L (ref 101–111)
Creatinine, Ser: 1.76 mg/dL — ABNORMAL HIGH (ref 0.61–1.24)
GFR calc Af Amer: 42 mL/min — ABNORMAL LOW (ref >60)
GFR calc non Af Amer: 36 mL/min — ABNORMAL LOW (ref >60)
Glucose, Bld: 122 mg/dL — ABNORMAL HIGH (ref 65–99)
Potassium: 4.4 mmol/L (ref 3.5–5.1)
Sodium: 137 mmol/L (ref 135–145)

## 2018-01-25 LAB — PREPARE RBC (CROSSMATCH)

## 2018-01-25 MED ORDER — SODIUM CHLORIDE 0.9 % IV SOLN
1.0000 g | Freq: Three times a day (TID) | INTRAVENOUS | Status: DC
  Administered 2018-01-25 – 2018-01-27 (×6): 1 g via INTRAVENOUS
  Filled 2018-01-25 (×7): qty 1

## 2018-01-25 NOTE — Progress Notes (Signed)
Subjective:  No signif events overnight - UOP only 660 but not all recorded   - crt and BUN both down Objective Vital signs in last 24 hours: Vitals:   01/25/18 0500 01/25/18 0600 01/25/18 0700 01/25/18 0805  BP:      Pulse: 68 70 71   Resp: 14 16 14    Temp:    97.9 F (36.6 C)  TempSrc:    Oral  SpO2: 96% 96% 97%   Weight:      Height:       Weight change:   Intake/Output Summary (Last 24 hours) at 01/25/2018 0847 Last data filed at 01/25/2018 0500 Gross per 24 hour  Intake 700.41 ml  Output 550 ml  Net 150.41 ml    Assessment/Plan: 77 year old white male with hypertension, diabetes, coronary artery disease, baseline stage III CKD creatinine 1.3-1.6-developed acute on chronic renal failure in the setting of postop from CABG 5/10 1.Renal-CKD at baseline, baseline creatinine 1.3-1.6.  Preop urinalysis was clean, post op with hematuria- no protein  Nonoliguric so do not suspect obstruction.  Suspect a hemodynamic insult/ATN in this setting.  I was able to confirm that he was on lisinopril all the way up to his surgery and that made the kidneys a little more susceptible to injury.  Right now there are no acute indications for dialysis,  UOP adequate and crt trending down!!   I hope we are out of the woods in terms of dialysis- he is close to his baseline.  Renal will sign off, call with questions 2. Hypertension/volume  -is volume overloaded and reportedly 5 L positive.  Received Lasix in the past with no significant response.  UOP is picking up on own- Because is not hypoxic I will not challenge him with lasix again yet, feel he will autodiurese-   Lopressor only- BP for the most part OK  3. Anemia  - post op issue- supportive care per CTS 4. Elytes - K normal 5. ID- on Fortaz- per primary      Kevon Tench A    Labs: Basic Metabolic Panel: Recent Labs  Lab 01/23/18 0526 01/24/18 0444 01/25/18 0358  NA 134* 137 137  K 3.5 3.8 4.4  CL 101 102 103  CO2 22 25 23    GLUCOSE 130* 123* 122*  BUN 65* 61* 54*  CREATININE 2.96* 2.06* 1.76*  CALCIUM 7.8* 8.3* 8.8*   Liver Function Tests: Recent Labs  Lab 01/21/18 0305 01/23/18 0526  AST 23 18  ALT 17 16*  ALKPHOS 27* 32*  BILITOT 0.9 0.7  PROT 5.7* 5.5*  ALBUMIN 2.8* 2.4*   No results for input(s): LIPASE, AMYLASE in the last 168 hours. No results for input(s): AMMONIA in the last 168 hours. CBC: Recent Labs  Lab 01/21/18 0305 01/22/18 0429 01/23/18 0526 01/24/18 0444 01/25/18 0358  WBC 9.5 7.1 7.0 6.9 11.3*  HGB 8.5* 8.1* 7.9* 7.7* 7.3*  HCT 26.4* 24.6* 24.4* 23.6* 22.9*  MCV 93.0 91.8 91.0 91.8 92.3  PLT 170 177 209 238 324   Cardiac Enzymes: No results for input(s): CKTOTAL, CKMB, CKMBINDEX, TROPONINI in the last 168 hours. CBG: Recent Labs  Lab 01/24/18 1214 01/24/18 1627 01/25/18 0006 01/25/18 0639 01/25/18 0803  GLUCAP 118* 120* 114* 109* 130*    Iron Studies: No results for input(s): IRON, TIBC, TRANSFERRIN, FERRITIN in the last 72 hours. Studies/Results: Dg Chest Port 1 View  Result Date: 01/25/2018 CLINICAL DATA:  Status post CABG.  Pain. EXAM: PORTABLE CHEST 1 VIEW COMPARISON:  Jan 23, 2018 FINDINGS: Platelike opacity in the left perihilar region is likely atelectasis, unchanged. No pneumothorax. Opacity in the left base is probably atelectasis, unchanged as well. Stable cardiomegaly. No other changes. IMPRESSION: No interval change. Stable cardiomegaly, left perihilar platelike opacity which is likely atelectasis, and probable atelectasis in the retrocardiac region. Electronically Signed   By: Dorise Bullion III M.D   On: 01/25/2018 08:43   Medications: Infusions: . sodium chloride    . cefTAZidime (FORTAZ)  IV Stopped (01/24/18 2156)  . potassium chloride Stopped (01/23/18 1030)    Scheduled Medications: . amiodarone  200 mg Oral BID  . aspirin EC  81 mg Oral Daily  . bisacodyl  10 mg Oral Daily   Or  . bisacodyl  10 mg Rectal Daily  . clonazePAM  1 mg  Oral QHS  . clopidogrel  75 mg Oral Daily  . docusate sodium  200 mg Oral Daily  . enoxaparin (LOVENOX) injection  30 mg Subcutaneous Q24H  . guaiFENesin  600 mg Oral BID  . insulin aspart  0-24 Units Subcutaneous TID AC & HS  . insulin detemir  14 Units Subcutaneous BID  . metoprolol tartrate  12.5 mg Oral BID  . pantoprazole  40 mg Oral Daily  . pneumococcal 23 valent vaccine  0.5 mL Intramuscular Tomorrow-1000  . simvastatin  20 mg Oral QHS  . sodium chloride flush  3 mL Intravenous Q12H  . sodium chloride flush  3 mL Intravenous Q12H    have reviewed scheduled and prn medications.  Physical Exam: General: sitting up in chair- looks better Heart: RRR Lungs: mostly clear Abdomen: obese, soft Extremities: mild edema    01/25/2018,8:47 AM  LOS: 7 days

## 2018-01-25 NOTE — Progress Notes (Addendum)
TCTS DAILY ICU PROGRESS NOTE                   [place]            [employer] [address]          [phone]   7 Days Post-Op Procedure(s) (LRB): CORONARY ARTERY BYPASS GRAFTING (CABG) x 3; Using Left Internal Mammary Artery, and Right Greater Saphenous Vein harvested Endoscopically, Coronary Artery Endarterectomy (N/A) TRANSESOPHAGEAL ECHOCARDIOGRAM (TEE) (N/A)  Total Length of Stay:  LOS: 7 days   Subjective: Feels ok, some soreness   Objective: Vital signs in last 24 hours: Temp:  [97.9 F (36.6 C)-98.4 F (36.9 C)] 97.9 F (36.6 C) (05/17 0805) Pulse Rate:  [68-86] 71 (05/17 0700) Cardiac Rhythm: Normal sinus rhythm (05/16 2000) Resp:  [13-25] 14 (05/17 0700) BP: (104-156)/(68-103) 138/86 (05/17 0300) SpO2:  [90 %-98 %] 97 % (05/17 0700)  Filed Weights   01/22/18 0500 01/23/18 0500 01/24/18 0600  Weight: 135 kg (297 lb 9.9 oz) 134.8 kg (297 lb 2.9 oz) 134.1 kg (295 lb 10.2 oz)    Weight change:    Hemodynamic parameters for last 24 hours: CVP:  [10 mmHg-11 mmHg] 10 mmHg  Intake/Output from previous day: 05/16 0701 - 05/17 0700 In: 707.7 [P.O.:480; I.V.:27.7; IV Piggyback:200] Out: 660 [Urine:660]  Intake/Output this shift: Total I/O In: 240 [P.O.:240] Out: -   Current Meds: Scheduled Meds: . amiodarone  200 mg Oral BID  . aspirin EC  81 mg Oral Daily  . bisacodyl  10 mg Oral Daily   Or  . bisacodyl  10 mg Rectal Daily  . clonazePAM  1 mg Oral QHS  . clopidogrel  75 mg Oral Daily  . docusate sodium  200 mg Oral Daily  . enoxaparin (LOVENOX) injection  30 mg Subcutaneous Q24H  . guaiFENesin  600 mg Oral BID  . insulin aspart  0-24 Units Subcutaneous TID AC & HS  . insulin detemir  14 Units Subcutaneous BID  . metoprolol tartrate  12.5 mg Oral BID  . pantoprazole  40 mg Oral Daily  . pneumococcal 23 valent vaccine  0.5 mL Intramuscular Tomorrow-1000  . simvastatin  20 mg Oral QHS  . sodium chloride flush  3 mL Intravenous Q12H  .  sodium chloride flush  3 mL Intravenous Q12H   Continuous Infusions: . sodium chloride    . cefTAZidime (FORTAZ)  IV Stopped (01/24/18 2156)  . potassium chloride Stopped (01/23/18 1030)   PRN Meds:.sodium chloride, acetaminophen, levalbuterol, magnesium hydroxide, metoprolol tartrate, ondansetron (ZOFRAN) IV, potassium chloride, sodium chloride flush, sodium chloride flush, traMADol  General appearance: alert, cooperative, fatigued and no distress Heart: regular rate and rhythm Lungs: mildly dim in bases Abdomen: benign Extremities: + LE edema Wound: incis healing well  Lab Results: CBC: Recent Labs    01/24/18 0444 01/25/18 0358  WBC 6.9 11.3*  HGB 7.7* 7.3*  HCT 23.6* 22.9*  PLT 238 324   BMET:  Recent Labs    01/24/18 0444 01/25/18 0358  NA 137 137  K 3.8 4.4  CL 102 103  CO2 25 23  GLUCOSE 123* 122*  BUN 61* 54*  CREATININE 2.06* 1.76*  CALCIUM 8.3* 8.8*    CMET: Lab Results  Component Value Date   WBC 11.3 (H) 01/25/2018   HGB 7.3 (L) 01/25/2018   HCT 22.9 (L) 01/25/2018   PLT 324 01/25/2018   GLUCOSE 122 (H) 01/25/2018   CHOL 117 12/10/2017  TRIG 159.0 (H) 12/10/2017   HDL 26.40 (L) 12/10/2017   LDLDIRECT 67.0 06/11/2017   LDLCALC 59 12/10/2017   ALT 16 (L) 01/23/2018   AST 18 01/23/2018   NA 137 01/25/2018   K 4.4 01/25/2018   CL 103 01/25/2018   CREATININE 1.76 (H) 01/25/2018   BUN 54 (H) 01/25/2018   CO2 23 01/25/2018   TSH 5.764 (H) 01/23/2018   PSA 0.83 11/05/2017   INR 1.20 01/18/2018   HGBA1C 6.8 (H) 01/16/2018   MICROALBUR 1.4 04/29/2014      PT/INR: No results for input(s): LABPROT, INR in the last 72 hours. Radiology: Dg Chest Port 1 View  Result Date: 01/25/2018 CLINICAL DATA:  Status post CABG.  Pain. EXAM: PORTABLE CHEST 1 VIEW COMPARISON:  Jan 23, 2018 FINDINGS: Platelike opacity in the left perihilar region is likely atelectasis, unchanged. No pneumothorax. Opacity in the left base is probably atelectasis, unchanged as  well. Stable cardiomegaly. No other changes. IMPRESSION: No interval change. Stable cardiomegaly, left perihilar platelike opacity which is likely atelectasis, and probable atelectasis in the retrocardiac region. Electronically Signed   By: Dorise Bullion III M.D   On: 01/25/2018 08:43     Assessment/Plan: S/P Procedure(s) (LRB): CORONARY ARTERY BYPASS GRAFTING (CABG) x 3; Using Left Internal Mammary Artery, and Right Greater Saphenous Vein harvested Endoscopically, Coronary Artery Endarterectomy (N/A) TRANSESOPHAGEAL ECHOCARDIOGRAM (TEE) (N/A)   1 steady overall progress, hemodyn stable in sinus rhythm, on amio/beta blocker 2 anemia has reached transfusion threshold, PRBS's ordered 3 Creat is improving- nephrology feels he will autodiurese 4 lopressor for current BP control - no ACE/ARB for now with renal issues 5 sugars fairly well controlled 6 poss transition to home meds soon, follow with elev creat. Cont insulin for now 7 cont aggressive pulm toilet/ push cardiac rehab as able 8 elev TSH on amio- monitor closely- consider further thyroid eval , but could be euthyroid sick with normal TSH in past [facility]    Renal fx baseline- renal signed off Received 1 U PRBC for postop blood loss anemia May transfer to CIR when bed available- patient needs rehab before safe to go home patient examined and medical record reviewed,agree with above note. Tharon Aquas Trigt III 01/25/2018

## 2018-01-25 NOTE — Progress Notes (Signed)
PHARMACY NOTE:  ANTIMICROBIAL RENAL DOSAGE ADJUSTMENT  Current antimicrobial regimen includes a mismatch between antimicrobial dosage and estimated renal function.  As per policy approved by the Pharmacy & Therapeutics and Medical Executive Committees, the antimicrobial dosage will be adjusted accordingly.  Current antimicrobial dosage:  Fortaz 1gm IV q12  Indication: Pneumonia  Renal Function:  Estimated Creatinine Clearance: 50 mL/min (A) (by C-G formula based on SCr of 1.76 mg/dL (H)).     Antimicrobial dosage has been changed to:   [place] 1gm IV q8h  Additional comments: Consider a total of 7 days or define the length of therapy   Thank you for allowing pharmacy to be a part of this patient's care.  Dareen Piano, [facility] 01/25/2018 9:11 AM

## 2018-01-25 NOTE — Progress Notes (Addendum)
Inpatient Rehabilitation  Case reviewed with Dr. Naaman Plummer.  Patient getting blood and not medically ready for IP Rehab today.  Insurance authorization still pending.  Updated patient and family at bedside.  Plan for my co-worker Pamala Hurry to follow up Monday, 01/28/18 with team.  Call if questions.   Carmelia Roller., CCC/SLP Admission Coordinator  [place]  Cell [phone]

## 2018-01-25 NOTE — Progress Notes (Signed)
Patient ID: [name] Young., male   DOB: 09-02-1941, 77 y.o.   MRN: [id number] EVENING ROUNDS NOTE :     [address]       [place] [address]             [phone]                 7 Days Post-Op Procedure(s) (LRB): CORONARY ARTERY BYPASS GRAFTING (CABG) x 3; Using Left Internal Mammary Artery, and Right Greater Saphenous Vein harvested Endoscopically, Coronary Artery Endarterectomy (N/A) TRANSESOPHAGEAL ECHOCARDIOGRAM (TEE) (N/A)  Total Length of Stay:  LOS: 7 days  BP 132/69   Pulse 72   Temp 97.8 F (36.6 C) (Oral)   Resp 16   Ht 5\' 10"  (1.778 m)   Wt 295 lb 10.2 oz (134.1 kg)   SpO2 98%   BMI 42.42 kg/m   .Intake/Output      05/16 0701 - 05/17 0700 05/17 0701 - 05/18 0700   P.O. 480 650   I.V. (mL/kg) 27.7 (0.2)    Blood  346   IV Piggyback 200    Total Intake(mL/kg) 707.7 (5.3) 996 (7.4)   Urine (mL/kg/hr) 660 (0.2) 150 (0.1)   Total Output 660 150   Net +47.7 +846        Urine Occurrence 3 x 1 x     . sodium chloride 250 mL (01/25/18 1318)  . cefTAZidime (FORTAZ)  IV Stopped (01/25/18 1047)  . potassium chloride Stopped (01/23/18 1030)     Lab Results  Component Value Date   WBC 11.3 (H) 01/25/2018   HGB 7.3 (L) 01/25/2018   HCT 22.9 (L) 01/25/2018   PLT 324 01/25/2018   GLUCOSE 122 (H) 01/25/2018   CHOL 117 12/10/2017   TRIG 159.0 (H) 12/10/2017   HDL 26.40 (L) 12/10/2017   LDLDIRECT 67.0 06/11/2017   LDLCALC 59 12/10/2017   ALT 16 (L) 01/23/2018   AST 18 01/23/2018   NA 137 01/25/2018   K 4.4 01/25/2018   CL 103 01/25/2018   CREATININE 1.76 (H) 01/25/2018   BUN 54 (H) 01/25/2018   CO2 23 01/25/2018   TSH 5.764 (H) 01/23/2018   PSA 0.83 11/05/2017   INR 1.20 01/18/2018   HGBA1C 6.8 (H) 01/16/2018   MICROALBUR 1.4 04/29/2014  week post op  Cr improving    [name] MD  Beeper [phone] Office [phone] 01/25/2018 6:04 PM

## 2018-01-26 ENCOUNTER — Inpatient Hospital Stay (HOSPITAL_COMMUNITY): Payer: Medicare Other

## 2018-01-26 LAB — CBC
HCT: 30.8 % — ABNORMAL LOW (ref 39.0–52.0)
Hemoglobin: 9.6 g/dL — ABNORMAL LOW (ref 13.0–17.0)
MCH: 29 pg (ref 26.0–34.0)
MCHC: 31.2 g/dL (ref 30.0–36.0)
MCV: 93.1 fL (ref 78.0–100.0)
Platelets: 360 10*3/uL (ref 150–400)
RBC: 3.31 MIL/uL — ABNORMAL LOW (ref 4.22–5.81)
RDW: 15.2 % (ref 11.5–15.5)
WBC: 12.1 10*3/uL — ABNORMAL HIGH (ref 4.0–10.5)

## 2018-01-26 LAB — GLUCOSE, CAPILLARY
Glucose-Capillary: 113 mg/dL — ABNORMAL HIGH (ref 65–99)
Glucose-Capillary: 170 mg/dL — ABNORMAL HIGH (ref 65–99)
Glucose-Capillary: 129 mg/dL — ABNORMAL HIGH (ref 65–99)
Glucose-Capillary: 118 mg/dL — ABNORMAL HIGH (ref 65–99)

## 2018-01-26 LAB — BASIC METABOLIC PANEL
Anion gap: 11 (ref 5–15)
BUN: 49 mg/dL — ABNORMAL HIGH (ref 6–20)
CO2: 22 mmol/L (ref 22–32)
Calcium: 8.7 mg/dL — ABNORMAL LOW (ref 8.9–10.3)
Chloride: 105 mmol/L (ref 101–111)
Creatinine, Ser: 1.54 mg/dL — ABNORMAL HIGH (ref 0.61–1.24)
GFR calc Af Amer: 49 mL/min — ABNORMAL LOW (ref >60)
GFR calc non Af Amer: 42 mL/min — ABNORMAL LOW (ref >60)
Glucose, Bld: 104 mg/dL — ABNORMAL HIGH (ref 65–99)
Potassium: 4.6 mmol/L (ref 3.5–5.1)
Sodium: 138 mmol/L (ref 135–145)

## 2018-01-26 LAB — BPAM RBC
Blood Product Expiration Date: 201906012359
ISSUE DATE / TIME: 201905171043
Unit Type and Rh: 6200

## 2018-01-26 LAB — TYPE AND SCREEN
ABO/RH(D): A POS
Antibody Screen: NEGATIVE
Unit division: 0

## 2018-01-26 MED ORDER — PNEUMOCOCCAL VAC POLYVALENT 25 MCG/0.5ML IJ INJ: 0.5000 mL | INJECTION | INTRAMUSCULAR | Status: DC | PRN

## 2018-01-26 NOTE — Progress Notes (Signed)
Patient ID: [name] Vandeusen., male   DOB: 12-01-1940, 77 y.o.   MRN: [id number] EVENING ROUNDS NOTE :     [address]       [place] [address]             [phone]                 8 Days Post-Op Procedure(s) (LRB): CORONARY ARTERY BYPASS GRAFTING (CABG) x 3; Using Left Internal Mammary Artery, and Right Greater Saphenous Vein harvested Endoscopically, Coronary Artery Endarterectomy (N/A) TRANSESOPHAGEAL ECHOCARDIOGRAM (TEE) (N/A)  Total Length of Stay:  LOS: 8 days  BP 123/71   Pulse 74   Temp 98 F (36.7 C) (Oral)   Resp 15   Ht 5\' 10"  (1.778 m)   Wt 297 lb 2.9 oz (134.8 kg)   SpO2 96%   BMI 42.64 kg/m   .Intake/Output      05/18 0701 - 05/19 0700   P.O. 1100   Blood    IV Piggyback 100   Total Intake(mL/kg) 1200 (8.9)   Urine (mL/kg/hr) 150 (0.1)   Stool 0   Total Output 150   Net +1050       Urine Occurrence 1 x   Stool Occurrence 1 x     . sodium chloride 250 mL (01/25/18 1318)  . cefTAZidime (FORTAZ)  IV Stopped (01/26/18 1359)  . potassium chloride Stopped (01/23/18 1030)     Lab Results  Component Value Date   WBC 12.1 (H) 01/26/2018   HGB 9.6 (L) 01/26/2018   HCT 30.8 (L) 01/26/2018   PLT 360 01/26/2018   GLUCOSE 104 (H) 01/26/2018   CHOL 117 12/10/2017   TRIG 159.0 (H) 12/10/2017   HDL 26.40 (L) 12/10/2017   LDLDIRECT 67.0 06/11/2017   LDLCALC 59 12/10/2017   ALT 16 (L) 01/23/2018   AST 18 01/23/2018   NA 138 01/26/2018   K 4.6 01/26/2018   CL 105 01/26/2018   CREATININE 1.54 (H) 01/26/2018   BUN 49 (H) 01/26/2018   CO2 22 01/26/2018   TSH 5.764 (H) 01/23/2018   PSA 0.83 11/05/2017   INR 1.20 01/18/2018   HGBA1C 6.8 (H) 01/16/2018   MICROALBUR 1.4 04/29/2014    Stable day, to rehab soon   [name] MD  Beeper [phone] Office [phone] 01/26/2018 7:08 PM

## 2018-01-26 NOTE — Progress Notes (Signed)
Patient ID: [name] Young., male   DOB: 1941/03/23, 77 y.o.   MRN: [id number] TCTS DAILY ICU PROGRESS NOTE                   [place]            [employer] [address]          [phone]   8 Days Post-Op Procedure(s) (LRB): CORONARY ARTERY BYPASS GRAFTING (CABG) x 3; Using Left Internal Mammary Artery, and Right Greater Saphenous Vein harvested Endoscopically, Coronary Artery Endarterectomy (N/A) TRANSESOPHAGEAL ECHOCARDIOGRAM (TEE) (N/A)  Total Length of Stay:  LOS: 8 days   Subjective: Patient up to chair, ambulated the length of the unit this morning, which is a greater distance that he has been doing , taking a p.o. diet well  Objective: Vital signs in last 24 hours: Temp:  [97.6 F (36.4 C)-98.6 F (37 C)] 98.6 F (37 C) (05/18 0728) Pulse Rate:  [56-78] 71 (05/18 0700) Cardiac Rhythm: Normal sinus rhythm (05/18 0400) Resp:  [13-20] 16 (05/18 0700) BP: (103-149)/(62-116) 131/88 (05/18 0728) SpO2:  [87 %-99 %] 98 % (05/18 0700) Weight:  [295 lb 10.2 oz (134.1 kg)-297 lb 2.9 oz (134.8 kg)] 297 lb 2.9 oz (134.8 kg) (05/18 0500)  Filed Weights   01/24/18 0600 01/25/18 1632 01/26/18 0500  Weight: 295 lb 10.2 oz (134.1 kg) 295 lb 10.2 oz (134.1 kg) 297 lb 2.9 oz (134.8 kg)    Weight change:    Hemodynamic parameters for last 24 hours:    Intake/Output from previous day: 05/17 0701 - 05/18 0700 In: 1546 [P.O.:1000; Blood:346; IV Piggyback:200] Out: 620 [Urine:620]  Intake/Output this shift: No intake/output data recorded.  Current Meds: Scheduled Meds: . amiodarone  200 mg Oral BID  . aspirin EC  81 mg Oral Daily  . bisacodyl  10 mg Oral Daily   Or  . bisacodyl  10 mg Rectal Daily  . clonazePAM  1 mg Oral QHS  . clopidogrel  75 mg Oral Daily  . docusate sodium  200 mg Oral Daily  . enoxaparin (LOVENOX) injection  30 mg Subcutaneous Q24H  . guaiFENesin  600 mg Oral BID  . insulin aspart  0-24 Units Subcutaneous TID AC & HS  . insulin  detemir  14 Units Subcutaneous BID  . metoprolol tartrate  12.5 mg Oral BID  . pantoprazole  40 mg Oral Daily  . simvastatin  20 mg Oral QHS  . sodium chloride flush  3 mL Intravenous Q12H  . sodium chloride flush  3 mL Intravenous Q12H   Continuous Infusions: . sodium chloride 250 mL (01/25/18 1318)  . cefTAZidime (FORTAZ)  IV Stopped (01/26/18 0645)  . potassium chloride Stopped (01/23/18 1030)   PRN Meds:.sodium chloride, acetaminophen, levalbuterol, magnesium hydroxide, metoprolol tartrate, ondansetron (ZOFRAN) IV, pneumococcal 23 valent vaccine, potassium chloride, sodium chloride flush, sodium chloride flush, traMADol  General appearance: alert, cooperative, appears older than stated age and no distress Neurologic: intact Heart: regular rate and rhythm, S1, S2 normal, no murmur, click, rub or gallop Lungs: diminished breath sounds bibasilar Abdomen: soft, non-tender; bowel sounds normal; no masses,  no organomegaly Extremities: Patient still with bilateral lower extremity edema but appears improved Wound: Sternal incisions healing well bone intact  Lab Results: CBC: Recent Labs    01/25/18 1720 01/26/18 0419  WBC 12.7* 12.1*  HGB 10.1* 9.6*  HCT 32.0* 30.8*  PLT 361 360   BMET:  Recent Labs    01/25/18  0358 01/26/18 0419  NA 137 138  K 4.4 4.6  CL 103 105  CO2 23 22  GLUCOSE 122* 104*  BUN 54* 49*  CREATININE 1.76* 1.54*  CALCIUM 8.8* 8.7*    CMET: Lab Results  Component Value Date   WBC 12.1 (H) 01/26/2018   HGB 9.6 (L) 01/26/2018   HCT 30.8 (L) 01/26/2018   PLT 360 01/26/2018   GLUCOSE 104 (H) 01/26/2018   CHOL 117 12/10/2017   TRIG 159.0 (H) 12/10/2017   HDL 26.40 (L) 12/10/2017   LDLDIRECT 67.0 06/11/2017   LDLCALC 59 12/10/2017   ALT 16 (L) 01/23/2018   AST 18 01/23/2018   NA 138 01/26/2018   K 4.6 01/26/2018   CL 105 01/26/2018   CREATININE 1.54 (H) 01/26/2018   BUN 49 (H) 01/26/2018   CO2 22 01/26/2018   TSH 5.764 (H) 01/23/2018   PSA  0.83 11/05/2017   INR 1.20 01/18/2018   HGBA1C 6.8 (H) 01/16/2018   MICROALBUR 1.4 04/29/2014      PT/INR: No results for input(s): LABPROT, INR in the last 72 hours. Radiology: Dg Chest Port 1 View  Result Date: 01/26/2018 CLINICAL DATA:  Follow-up chest tube EXAM: PORTABLE CHEST 1 VIEW COMPARISON:  Yesterday FINDINGS: No visible chest tube. Cardiopericardial enlargement with hazy opacity at the left more than right base. Streaky opacity at the left apex. No visible pneumothorax. No pulmonary edema. IMPRESSION: 1. History of chest tube with none seen.  No visible pneumothorax. 2. Stable haziness of the lower chest that is likely atelectasis and pleural fluid. Electronically Signed   By: Monte Fantasia M.D.   On: 01/26/2018 08:02     Assessment/Plan: S/P Procedure(s) (LRB): CORONARY ARTERY BYPASS GRAFTING (CABG) x 3; Using Left Internal Mammary Artery, and Right Greater Saphenous Vein harvested Endoscopically, Coronary Artery Endarterectomy (N/A) TRANSESOPHAGEAL ECHOCARDIOGRAM (TEE) (N/A) Mobilize Diuresis Diabetes control Resolving acute on chronic renal failure Slowly improving with ambulation   [name] Young 01/26/2018 8:31 AM

## 2018-01-27 LAB — BASIC METABOLIC PANEL
Anion gap: 11 (ref 5–15)
BUN: 44 mg/dL — ABNORMAL HIGH (ref 6–20)
CO2: 23 mmol/L (ref 22–32)
Calcium: 8.8 mg/dL — ABNORMAL LOW (ref 8.9–10.3)
Chloride: 103 mmol/L (ref 101–111)
Creatinine, Ser: 1.42 mg/dL — ABNORMAL HIGH (ref 0.61–1.24)
GFR calc Af Amer: 54 mL/min — ABNORMAL LOW (ref >60)
GFR calc non Af Amer: 47 mL/min — ABNORMAL LOW (ref >60)
Glucose, Bld: 101 mg/dL — ABNORMAL HIGH (ref 65–99)
Potassium: 4.2 mmol/L (ref 3.5–5.1)
Sodium: 137 mmol/L (ref 135–145)

## 2018-01-27 LAB — CBC
HCT: 30.6 % — ABNORMAL LOW (ref 39.0–52.0)
Hemoglobin: 9.6 g/dL — ABNORMAL LOW (ref 13.0–17.0)
MCH: 29.5 pg (ref 26.0–34.0)
MCHC: 31.4 g/dL (ref 30.0–36.0)
MCV: 94.2 fL (ref 78.0–100.0)
Platelets: 398 10*3/uL (ref 150–400)
RBC: 3.25 MIL/uL — ABNORMAL LOW (ref 4.22–5.81)
RDW: 15.4 % (ref 11.5–15.5)
WBC: 12.3 10*3/uL — ABNORMAL HIGH (ref 4.0–10.5)

## 2018-01-27 LAB — GLUCOSE, CAPILLARY
Glucose-Capillary: 116 mg/dL — ABNORMAL HIGH (ref 65–99)
Glucose-Capillary: 179 mg/dL — ABNORMAL HIGH (ref 65–99)
Glucose-Capillary: 79 mg/dL (ref 65–99)

## 2018-01-27 MED ORDER — BISACODYL 10 MG RE SUPP: 10.0000 mg | Freq: Every day | RECTAL | Status: DC | PRN

## 2018-01-27 MED ORDER — BISACODYL 5 MG PO TBEC: 10.0000 mg | DELAYED_RELEASE_TABLET | Freq: Every day | ORAL | Status: DC | PRN

## 2018-01-27 MED ORDER — SODIUM CHLORIDE 0.9% FLUSH: 3.0000 mL | INTRAVENOUS | Status: DC | PRN

## 2018-01-27 MED ORDER — ONDANSETRON HCL 4 MG/2ML IJ SOLN: 4.0000 mg | Freq: Four times a day (QID) | INTRAMUSCULAR | Status: DC | PRN

## 2018-01-27 MED ORDER — FUROSEMIDE 40 MG PO TABS
40.0000 mg | ORAL_TABLET | Freq: Every day | ORAL | Status: DC
  Administered 2018-01-27 – 2018-01-28 (×2): 40 mg via ORAL
  Filled 2018-01-27 (×2): qty 1

## 2018-01-27 MED ORDER — ONDANSETRON HCL 4 MG PO TABS: 4.0000 mg | ORAL_TABLET | Freq: Four times a day (QID) | ORAL | Status: DC | PRN

## 2018-01-27 MED ORDER — MOVING RIGHT ALONG BOOK
Freq: Once | Status: AC
  Administered 2018-01-27: 10:00:00
  Filled 2018-01-27: qty 1

## 2018-01-27 MED ORDER — PANTOPRAZOLE SODIUM 40 MG PO TBEC
40.0000 mg | DELAYED_RELEASE_TABLET | Freq: Every day | ORAL | Status: DC
  Administered 2018-01-27 – 2018-01-28 (×2): 40 mg via ORAL
  Filled 2018-01-27 (×2): qty 1

## 2018-01-27 MED ORDER — SODIUM CHLORIDE 0.9% FLUSH
3.0000 mL | Freq: Two times a day (BID) | INTRAVENOUS | Status: DC
  Administered 2018-01-27 – 2018-01-28 (×3): 3 mL via INTRAVENOUS

## 2018-01-27 MED ORDER — SODIUM CHLORIDE 0.9 % IV SOLN: 250.0000 mL | INTRAVENOUS | Status: DC | PRN

## 2018-01-27 MED ORDER — METOPROLOL TARTRATE 12.5 MG HALF TABLET
12.5000 mg | ORAL_TABLET | Freq: Two times a day (BID) | ORAL | Status: DC
  Administered 2018-01-27 – 2018-01-28 (×3): 12.5 mg via ORAL
  Filled 2018-01-27 (×3): qty 1

## 2018-01-27 MED ORDER — INSULIN ASPART 100 UNIT/ML ~~LOC~~ SOLN
0.0000 [IU] | Freq: Three times a day (TID) | SUBCUTANEOUS | Status: DC
  Administered 2018-01-27: 2 [IU] via SUBCUTANEOUS
  Administered 2018-01-27: 4 [IU] via SUBCUTANEOUS
  Administered 2018-01-28 (×2): 2 [IU] via SUBCUTANEOUS

## 2018-01-27 NOTE — Progress Notes (Addendum)
Patient ID: [name] Rathe., male   DOB: Aug 18, 1941, 77 y.o.   MRN: [id number] TCTS DAILY ICU PROGRESS NOTE                   [address]            [name] [address]          [phone]   9 Days Post-Op Procedure(s) (LRB): CORONARY ARTERY BYPASS GRAFTING (CABG) x 3; Using Left Internal Mammary Artery, and Right Greater Saphenous Vein harvested Endoscopically, Coronary Artery Endarterectomy (N/A) TRANSESOPHAGEAL ECHOCARDIOGRAM (TEE) (N/A)  Total Length of Stay:  LOS: 9 days   Subjective: Patient awake alert walked in the unit this morning currently in the chair  Objective: Vital signs in last 24 hours: Temp:  [97.8 F (36.6 C)-98.1 F (36.7 C)] 98.1 F (36.7 C) (05/19 0400) Pulse Rate:  [63-89] 83 (05/19 0800) Cardiac Rhythm: Normal sinus rhythm (05/19 0800) Resp:  [14-23] 20 (05/19 0800) BP: (107-149)/(63-92) 120/83 (05/19 0800) SpO2:  [94 %-97 %] 97 % (05/19 0800) Weight:  [297 lb 6.4 oz (134.9 kg)] 297 lb 6.4 oz (134.9 kg) (05/19 0600)  Filed Weights   01/25/18 1632 01/26/18 0500 01/27/18 0600  Weight: 295 lb 10.2 oz (134.1 kg) 297 lb 2.9 oz (134.8 kg) 297 lb 6.4 oz (134.9 kg)    Weight change: 1 lb 12.2 oz (0.8 kg)   Hemodynamic parameters for last 24 hours:    Intake/Output from previous day: 05/18 0701 - 05/19 0700 In: 1200 [P.O.:1100; IV Piggyback:100] Out: 550 [Urine:550]  Intake/Output this shift: Total I/O In: 200 [P.O.:150; IV Piggyback:50] Out: -   Current Meds: Scheduled Meds: . amiodarone  200 mg Oral BID  . aspirin EC  81 mg Oral Daily  . bisacodyl  10 mg Oral Daily   Or  . bisacodyl  10 mg Rectal Daily  . clonazePAM  1 mg Oral QHS  . clopidogrel  75 mg Oral Daily  . docusate sodium  200 mg Oral Daily  . enoxaparin (LOVENOX) injection  30 mg Subcutaneous Q24H  . guaiFENesin  600 mg Oral BID  . insulin aspart  0-24 Units Subcutaneous TID AC & HS  . insulin detemir  14 Units Subcutaneous BID  . metoprolol tartrate   12.5 mg Oral BID  . pantoprazole  40 mg Oral Daily  . simvastatin  20 mg Oral QHS  . sodium chloride flush  3 mL Intravenous Q12H  . sodium chloride flush  3 mL Intravenous Q12H   Continuous Infusions: . sodium chloride 250 mL (01/25/18 1318)  . cefTAZidime (FORTAZ)  IV Stopped (01/27/18 0645)  . potassium chloride Stopped (01/23/18 1030)   PRN Meds:.sodium chloride, acetaminophen, levalbuterol, magnesium hydroxide, metoprolol tartrate, ondansetron (ZOFRAN) IV, pneumococcal 23 valent vaccine, potassium chloride, sodium chloride flush, sodium chloride flush, traMADol  General appearance: alert and cooperative Neurologic: intact Heart: regular rate and rhythm, S1, S2 normal, no murmur, click, rub or gallop Lungs: diminished breath sounds bibasilar Abdomen: soft, non-tender; bowel sounds normal; no masses,  no organomegaly Extremities: edema Bilateral lower extremity edema improving Wound: Sternal wound is intact bone stable  Lab Results: CBC: Recent Labs    01/26/18 0419 01/27/18 0255  WBC 12.1* 12.3*  HGB 9.6* 9.6*  HCT 30.8* 30.6*  PLT 360 398   BMET:  Recent Labs    01/26/18 0419 01/27/18 0255  NA 138 137  K 4.6 4.2  CL 105 103  CO2 22 23  GLUCOSE  104* 101*  BUN 49* 44*  CREATININE 1.54* 1.42*  CALCIUM 8.7* 8.8*    CMET: Lab Results  Component Value Date   WBC 12.3 (H) 01/27/2018   HGB 9.6 (L) 01/27/2018   HCT 30.6 (L) 01/27/2018   PLT 398 01/27/2018   GLUCOSE 101 (H) 01/27/2018   CHOL 117 12/10/2017   TRIG 159.0 (H) 12/10/2017   HDL 26.40 (L) 12/10/2017   LDLDIRECT 67.0 06/11/2017   LDLCALC 59 12/10/2017   ALT 16 (L) 01/23/2018   AST 18 01/23/2018   NA 137 01/27/2018   K 4.2 01/27/2018   CL 103 01/27/2018   CREATININE 1.42 (H) 01/27/2018   BUN 44 (H) 01/27/2018   CO2 23 01/27/2018   TSH 5.764 (H) 01/23/2018   PSA 0.83 11/05/2017   INR 1.20 01/18/2018   HGBA1C 6.8 (H) 01/16/2018   MICROALBUR 1.4 04/29/2014      PT/INR: No results for  input(s): LABPROT, INR in the last 72 hours. Radiology: No results found.   Assessment/Plan: S/P Procedure(s) (LRB): CORONARY ARTERY BYPASS GRAFTING (CABG) x 3; Using Left Internal Mammary Artery, and Right Greater Saphenous Vein harvested Endoscopically, Coronary Artery Endarterectomy (N/A) TRANSESOPHAGEAL ECHOCARDIOGRAM (TEE) (N/A) Plan for transfer to step-down: see transfer orders Ready for rehab Renal function continues to improve  Has been on fortaz 7 days will d/c now    [name] Young 01/27/2018 8:24 AM

## 2018-01-28 ENCOUNTER — Other Ambulatory Visit: Payer: Self-pay

## 2018-01-28 ENCOUNTER — Inpatient Hospital Stay (HOSPITAL_COMMUNITY)
Admission: RE | Admit: 2018-01-28 | Discharge: 2018-02-02 | DRG: 945 | Disposition: A | Discharge status: Home Health | Payer: Medicare Other | Source: Intra-hospital | Attending: Physical Medicine & Rehabilitation | Admitting: Physical Medicine & Rehabilitation

## 2018-01-28 ENCOUNTER — Encounter (HOSPITAL_COMMUNITY): Payer: Self-pay | Admitting: *Deleted

## 2018-01-28 ENCOUNTER — Inpatient Hospital Stay (HOSPITAL_COMMUNITY): Payer: Medicare Other

## 2018-01-28 DIAGNOSIS — Z8249 Family history of ischemic heart disease and other diseases of the circulatory system: Secondary | ICD-10-CM | POA: Diagnosis not present

## 2018-01-28 DIAGNOSIS — Z79899 Other long term (current) drug therapy: Secondary | ICD-10-CM | POA: Diagnosis not present

## 2018-01-28 DIAGNOSIS — E1122 Type 2 diabetes mellitus with diabetic chronic kidney disease: Secondary | ICD-10-CM | POA: Diagnosis present

## 2018-01-28 DIAGNOSIS — N183 Chronic kidney disease, stage 3 (moderate): Secondary | ICD-10-CM | POA: Diagnosis present

## 2018-01-28 DIAGNOSIS — F411 Generalized anxiety disorder: Secondary | ICD-10-CM

## 2018-01-28 DIAGNOSIS — K5900 Constipation, unspecified: Secondary | ICD-10-CM | POA: Diagnosis present

## 2018-01-28 DIAGNOSIS — Z6841 Body Mass Index (BMI) 40.0 and over, adult: Secondary | ICD-10-CM | POA: Diagnosis not present

## 2018-01-28 DIAGNOSIS — Z87891 Personal history of nicotine dependence: Secondary | ICD-10-CM | POA: Diagnosis not present

## 2018-01-28 DIAGNOSIS — D62 Acute posthemorrhagic anemia: Secondary | ICD-10-CM | POA: Diagnosis present

## 2018-01-28 DIAGNOSIS — E1142 Type 2 diabetes mellitus with diabetic polyneuropathy: Secondary | ICD-10-CM | POA: Diagnosis present

## 2018-01-28 DIAGNOSIS — Z794 Long term (current) use of insulin: Secondary | ICD-10-CM | POA: Diagnosis not present

## 2018-01-28 DIAGNOSIS — Z833 Family history of diabetes mellitus: Secondary | ICD-10-CM

## 2018-01-28 DIAGNOSIS — Z7982 Long term (current) use of aspirin: Secondary | ICD-10-CM

## 2018-01-28 DIAGNOSIS — R601 Generalized edema: Secondary | ICD-10-CM | POA: Diagnosis not present

## 2018-01-28 DIAGNOSIS — I10 Essential (primary) hypertension: Secondary | ICD-10-CM

## 2018-01-28 DIAGNOSIS — R5381 Other malaise: Principal | ICD-10-CM | POA: Diagnosis present

## 2018-01-28 DIAGNOSIS — E8809 Other disorders of plasma-protein metabolism, not elsewhere classified: Secondary | ICD-10-CM

## 2018-01-28 DIAGNOSIS — E1165 Type 2 diabetes mellitus with hyperglycemia: Secondary | ICD-10-CM

## 2018-01-28 DIAGNOSIS — D649 Anemia, unspecified: Secondary | ICD-10-CM

## 2018-01-28 DIAGNOSIS — K219 Gastro-esophageal reflux disease without esophagitis: Secondary | ICD-10-CM | POA: Diagnosis present

## 2018-01-28 DIAGNOSIS — Z951 Presence of aortocoronary bypass graft: Secondary | ICD-10-CM

## 2018-01-28 DIAGNOSIS — G479 Sleep disorder, unspecified: Secondary | ICD-10-CM

## 2018-01-28 DIAGNOSIS — R627 Adult failure to thrive: Secondary | ICD-10-CM | POA: Diagnosis present

## 2018-01-28 DIAGNOSIS — E46 Unspecified protein-calorie malnutrition: Secondary | ICD-10-CM

## 2018-01-28 DIAGNOSIS — E785 Hyperlipidemia, unspecified: Secondary | ICD-10-CM | POA: Diagnosis present

## 2018-01-28 DIAGNOSIS — I2510 Atherosclerotic heart disease of native coronary artery without angina pectoris: Secondary | ICD-10-CM | POA: Diagnosis present

## 2018-01-28 DIAGNOSIS — I129 Hypertensive chronic kidney disease with stage 1 through stage 4 chronic kidney disease, or unspecified chronic kidney disease: Secondary | ICD-10-CM | POA: Diagnosis present

## 2018-01-28 LAB — CBC
HCT: 30.7 % — ABNORMAL LOW (ref 39.0–52.0)
HCT: 30.8 % — ABNORMAL LOW (ref 39.0–52.0)
Hemoglobin: 9.6 g/dL — ABNORMAL LOW (ref 13.0–17.0)
Hemoglobin: 9.4 g/dL — ABNORMAL LOW (ref 13.0–17.0)
MCH: 29.3 pg (ref 26.0–34.0)
MCH: 29.1 pg (ref 26.0–34.0)
MCHC: 31.3 g/dL (ref 30.0–36.0)
MCHC: 30.5 g/dL (ref 30.0–36.0)
MCV: 93.6 fL (ref 78.0–100.0)
MCV: 95.4 fL (ref 78.0–100.0)
PLATELETS: 411 10*3/uL — AB (ref 150–400)
Platelets: 416 10*3/uL — ABNORMAL HIGH (ref 150–400)
RBC: 3.28 MIL/uL — ABNORMAL LOW (ref 4.22–5.81)
RBC: 3.23 MIL/uL — ABNORMAL LOW (ref 4.22–5.81)
RDW: 15.3 % (ref 11.5–15.5)
RDW: 15.7 % — AB (ref 11.5–15.5)
WBC: 11.3 10*3/uL — ABNORMAL HIGH (ref 4.0–10.5)
WBC: 10.9 10*3/uL — ABNORMAL HIGH (ref 4.0–10.5)

## 2018-01-28 LAB — BASIC METABOLIC PANEL
Anion gap: 10 (ref 5–15)
BUN: 44 mg/dL — ABNORMAL HIGH (ref 6–20)
CO2: 27 mmol/L (ref 22–32)
Calcium: 8.9 mg/dL (ref 8.9–10.3)
Chloride: 102 mmol/L (ref 101–111)
Creatinine, Ser: 1.62 mg/dL — ABNORMAL HIGH (ref 0.61–1.24)
GFR calc Af Amer: 46 mL/min — ABNORMAL LOW (ref >60)
GFR calc non Af Amer: 40 mL/min — ABNORMAL LOW (ref >60)
Glucose, Bld: 111 mg/dL — ABNORMAL HIGH (ref 65–99)
Potassium: 4.2 mmol/L (ref 3.5–5.1)
Sodium: 139 mmol/L (ref 135–145)

## 2018-01-28 LAB — CREATININE, SERUM
Creatinine, Ser: 1.69 mg/dL — ABNORMAL HIGH (ref 0.61–1.24)
GFR calc Af Amer: 44 mL/min — ABNORMAL LOW (ref >60)
GFR calc non Af Amer: 38 mL/min — ABNORMAL LOW (ref >60)

## 2018-01-28 LAB — GLUCOSE, CAPILLARY
Glucose-Capillary: 152 mg/dL — ABNORMAL HIGH (ref 65–99)
Glucose-Capillary: 116 mg/dL — ABNORMAL HIGH (ref 65–99)
Glucose-Capillary: 96 mg/dL (ref 65–99)
Glucose-Capillary: 145 mg/dL — ABNORMAL HIGH (ref 65–99)
Glucose-Capillary: 152 mg/dL — ABNORMAL HIGH (ref 65–99)
Glucose-Capillary: 93 mg/dL (ref 65–99)

## 2018-01-28 MED ORDER — ASPIRIN EC 81 MG PO TBEC
81.0000 mg | DELAYED_RELEASE_TABLET | Freq: Every day | ORAL | Status: DC
  Administered 2018-01-29 – 2018-02-02 (×5): 81 mg via ORAL
  Filled 2018-01-28 (×5): qty 1

## 2018-01-28 MED ORDER — INSULIN ASPART 100 UNIT/ML ~~LOC~~ SOLN
0.0000 [IU] | Freq: Three times a day (TID) | SUBCUTANEOUS | Status: DC
  Administered 2018-01-29 – 2018-02-01 (×2): 2 [IU] via SUBCUTANEOUS

## 2018-01-28 MED ORDER — BISACODYL 5 MG PO TBEC
10.0000 mg | DELAYED_RELEASE_TABLET | Freq: Every day | ORAL | Status: DC
  Administered 2018-01-29 – 2018-02-02 (×5): 10 mg via ORAL
  Filled 2018-01-28 (×5): qty 2

## 2018-01-28 MED ORDER — BISACODYL 5 MG PO TBEC: 10.0000 mg | DELAYED_RELEASE_TABLET | Freq: Every day | ORAL | Status: DC | PRN

## 2018-01-28 MED ORDER — PANTOPRAZOLE SODIUM 40 MG PO TBEC
40.0000 mg | DELAYED_RELEASE_TABLET | Freq: Every day | ORAL | Status: DC
  Administered 2018-01-29 – 2018-02-02 (×5): 40 mg via ORAL
  Filled 2018-01-28 (×5): qty 1

## 2018-01-28 MED ORDER — TRAMADOL HCL 50 MG PO TABS
50.0000 mg | ORAL_TABLET | Freq: Four times a day (QID) | ORAL | Status: DC | PRN
  Administered 2018-01-28 – 2018-02-02 (×10): 50 mg via ORAL
  Filled 2018-01-28 (×11): qty 1

## 2018-01-28 MED ORDER — ACETAMINOPHEN 500 MG PO TABS
750.0000 mg | ORAL_TABLET | Freq: Four times a day (QID) | ORAL | Status: DC | PRN
  Administered 2018-01-30 – 2018-02-01 (×3): 750 mg via ORAL
  Filled 2018-01-28 (×3): qty 2

## 2018-01-28 MED ORDER — CLONAZEPAM 0.5 MG PO TABS
1.0000 mg | ORAL_TABLET | Freq: Every day | ORAL | Status: DC
  Administered 2018-01-28 – 2018-02-01 (×5): 1 mg via ORAL
  Filled 2018-01-28 (×5): qty 2

## 2018-01-28 MED ORDER — PANTOPRAZOLE SODIUM 40 MG PO TBEC: 40.0000 mg | DELAYED_RELEASE_TABLET | Freq: Every day | ORAL | Status: DC

## 2018-01-28 MED ORDER — INSULIN DETEMIR 100 UNIT/ML ~~LOC~~ SOLN
14.0000 [IU] | Freq: Two times a day (BID) | SUBCUTANEOUS | Status: DC
  Administered 2018-01-28 – 2018-02-02 (×10): 14 [IU] via SUBCUTANEOUS
  Filled 2018-01-28 (×10): qty 0.14

## 2018-01-28 MED ORDER — METOPROLOL TARTRATE 12.5 MG HALF TABLET: 12.5000 mg | ORAL_TABLET | Freq: Two times a day (BID) | ORAL | Status: DC

## 2018-01-28 MED ORDER — BISACODYL 10 MG RE SUPP: 10.0000 mg | Freq: Every day | RECTAL | Status: DC | PRN

## 2018-01-28 MED ORDER — ACETAMINOPHEN 325 MG PO TABS
650.0000 mg | ORAL_TABLET | Freq: Four times a day (QID) | ORAL | Status: DC | PRN
  Administered 2018-01-28: 650 mg via ORAL
  Filled 2018-01-28: qty 2

## 2018-01-28 MED ORDER — ENOXAPARIN SODIUM 30 MG/0.3ML ~~LOC~~ SOLN: 30.0000 mg | SUBCUTANEOUS | Status: DC

## 2018-01-28 MED ORDER — AMIODARONE HCL 200 MG PO TABS
200.0000 mg | ORAL_TABLET | Freq: Two times a day (BID) | ORAL | Status: DC
  Administered 2018-01-28 – 2018-02-02 (×10): 200 mg via ORAL
  Filled 2018-01-28 (×10): qty 1

## 2018-01-28 MED ORDER — DOCUSATE SODIUM 100 MG PO CAPS
200.0000 mg | ORAL_CAPSULE | Freq: Every day | ORAL | Status: DC
  Administered 2018-01-29 – 2018-02-02 (×5): 200 mg via ORAL
  Filled 2018-01-28 (×5): qty 2

## 2018-01-28 MED ORDER — CLOPIDOGREL BISULFATE 75 MG PO TABS
75.0000 mg | ORAL_TABLET | Freq: Every day | ORAL | Status: DC
  Administered 2018-01-29 – 2018-02-02 (×5): 75 mg via ORAL
  Filled 2018-01-28 (×5): qty 1

## 2018-01-28 MED ORDER — ENOXAPARIN SODIUM 30 MG/0.3ML ~~LOC~~ SOLN
30.0000 mg | SUBCUTANEOUS | Status: DC
  Administered 2018-01-28 – 2018-02-01 (×5): 30 mg via SUBCUTANEOUS
  Filled 2018-01-28 (×5): qty 0.3

## 2018-01-28 MED ORDER — FUROSEMIDE 40 MG PO TABS
40.0000 mg | ORAL_TABLET | Freq: Every day | ORAL | Status: DC
  Administered 2018-01-29 – 2018-02-02 (×5): 40 mg via ORAL
  Filled 2018-01-28 (×5): qty 1

## 2018-01-28 MED ORDER — GUAIFENESIN ER 600 MG PO TB12
600.0000 mg | ORAL_TABLET | Freq: Two times a day (BID) | ORAL | Status: DC
  Administered 2018-01-28 – 2018-02-02 (×10): 600 mg via ORAL
  Filled 2018-01-28 (×10): qty 1

## 2018-01-28 MED ORDER — SIMVASTATIN 20 MG PO TABS
20.0000 mg | ORAL_TABLET | Freq: Every day | ORAL | Status: DC
  Administered 2018-01-28 – 2018-02-01 (×5): 20 mg via ORAL
  Filled 2018-01-28 (×5): qty 1

## 2018-01-28 MED ORDER — BISACODYL 10 MG RE SUPP
10.0000 mg | Freq: Every day | RECTAL | Status: DC
  Filled 2018-01-28 (×2): qty 1

## 2018-01-28 MED ORDER — METOPROLOL TARTRATE 12.5 MG HALF TABLET
12.5000 mg | ORAL_TABLET | Freq: Two times a day (BID) | ORAL | Status: DC
  Administered 2018-01-28 – 2018-02-02 (×10): 12.5 mg via ORAL
  Filled 2018-01-28 (×10): qty 1

## 2018-01-28 NOTE — IPOC Note (Signed)
Overall Plan of Care [facility]) Patient Details Name: [name] [name]. MRN: [id number] DOB: 1941-02-28  Admitting Diagnosis: Debility  Hospital Problems: Active Problems:   Debility   Anxiety state   Acute blood loss anemia   Type 2 diabetes mellitus with peripheral neuropathy (HCC)   Stage 3 chronic kidney disease (HCC)   Constipation   Hyperlipidemia   Anasarca   Hypoalbuminemia due to protein-calorie malnutrition (HCC)     Functional Problem List: Nursing Bladder, Motor, Pain, Safety, Skin Integrity  PT Balance, Endurance, Motor, Pain, Safety  OT Balance, Endurance, Pain  SLP    TR         Basic ADL's: OT Grooming, Bathing, Dressing, Toileting     Advanced  ADL's: OT       Transfers: PT Bed Mobility, Bed to Chair, Car, Manufacturing systems engineer, Metallurgist: PT Ambulation, Emergency planning/management officer, Stairs     Additional Impairments: OT    SLP        TR      Anticipated Outcomes Item Anticipated Outcome  Self Feeding independent  Swallowing      Basic self-care  supervision to min assist (donning TEDs and shoes)  Toileting  supervision   Bathroom Transfers supervision  Bowel/Bladder  Continent to B&B with min. assist.  Transfers  modI  Locomotion  S with LRAD  Communication     Cognition     Pain  Less than 3 on 1 to 10 scale.  Safety/Judgment  Free from falls during his time on rehab   Therapy Plan: PT Intensity: Minimum of 1-2 x/day ,45 to 90 minutes PT Frequency: 5 out of 7 days PT Duration Estimated Length of Stay: 5-7 days OT Intensity: Minimum of 1-2 x/day, 45 to 90 minutes OT Frequency: 5 out of 7 days OT Duration/Estimated Length of Stay: 5-7 days      Team Interventions: Nursing Interventions Patient/Family Education, Pain Management, Bladder Management, Skin Care/Wound Management  PT interventions Ambulation/gait training, Discharge planning, Functional mobility training, Psychosocial support, Therapeutic  Activities, Wheelchair propulsion/positioning, Therapeutic Exercise, Neuromuscular re-education, Skin care/wound management, Disease management/prevention, Training and development officer, DME/adaptive equipment instruction, Pain management, UE/LE Strength taining/ROM, UE/LE Coordination activities, Stair training, Patient/family education, Community reintegration  OT Interventions Training and development officer, Discharge planning, Pain management, Self Care/advanced ADL retraining, Therapeutic Activities, Functional mobility training, Patient/family education, Therapeutic Exercise, DME/adaptive equipment instruction, Community reintegration, Neuromuscular re-education, UE/LE Strength taining/ROM  SLP Interventions    TR Interventions    SW/CM Interventions Discharge Planning, Psychosocial Support, Patient/Family Education   Barriers to Discharge MD  Medical stability and Weight  Nursing      PT Inaccessible home environment 3 STE home  OT      SLP      SW       Team Discharge Planning: Destination: PT-Home ,OT- Home , SLP-  Projected Follow-up: PT-Home health PT, 24 hour supervision/assistance, OT-  24 hour supervision/assistance, SLP-  Projected Equipment Needs: PT-To be determined, OT- 3 in 1 bedside comode(wide 3:1), SLP-  Equipment Details: PT-may need RW, TBD, OT-  Patient/family involved in discharge planning: PT- Patient, Family member/caregiver,  OT-Patient, SLP-   MD ELOS: 4-7 days. Medical Rehab Prognosis:  Good                                  Assessment: 77 year old right-handed male with past medical history of hypertension, diabetes mellitus, CKD stage III.  Presented 01/18/2018 with increasing dyspnea on exertion and has been followed by cardiothoracic surgery with recent echocardiogram showing ejection fraction of 45% no significant regurgitation as well as chest x-ray unremarkable.  EKG was nonspecific.  Cardiac catheterization completed demonstrating chronic occlusion of the  RCA with high-grade LAD diagonal stenosis.  Underwent CABG x3 01/18/2018 per Dr. Prescott Gum.  Hospital course pain management with sternal precautions.  Nephrology consulted 01/22/2018 for elevating creatinine postoperatively 3.3 from a baseline 1.6.  No acute indications at this time for dialysis.  Creatinine stabilizing and nephrology services have signed off.  Acute blood loss anemia monitored after being transfused.  Currently maintained on aspirin and Plavix after CABG. Initially required milrinone for blood pressure control which is since been weaned off.  Tolerating a regular diet.  Patient with resultant functional deficits with mobility, endurance, self-care. Will set goals for Supervision PT/OT.  See Team Conference Notes for weekly updates to the plan of care

## 2018-01-28 NOTE — H&P (Signed)
Physical Medicine and Rehabilitation Admission H&P    Chief complaint: Weakness  HPI: [name] [name]. [name] [name], [name] [name]. is a 77 year old right-handed male with past medical history of hypertension, diabetes mellitus, CKD stage III.  History taken from chart review, patient, and wife. Patient independent prior to admission living with his wife.  Presented 01/18/2018 with increasing dyspnea on exertion and has been followed by cardiothoracic surgery with recent echocardiogram showing ejection fraction of 45% no significant regurgitation as well as chest x-ray unremarkable.  EKG was nonspecific.  Cardiac catheterization completed demonstrating chronic occlusion of the RCA with high-grade LAD diagonal stenosis.  Underwent CABG x3 01/18/2018 per Dr. Prescott Gum.  Hospital course pain management with sternal precautions.  Nephrology consulted 01/22/2018 for elevating creatinine postoperatively 3.3 from a baseline 1.6.  No acute indications at this time for dialysis.  Creatinine stabilizing 1.62 and nephrology services have signed off.  Acute blood loss anemia 7.3 and monitored after being transfused and latest hemoglobin 9.6.  Currently maintained on aspirin and Plavix after CABG. Initially required milrinone for blood pressure control which is since been weaned off.  Subcutaneous Lovenox for DVT prophylaxis.  Tolerating a regular diet.  Physical and occupational therapy evaluations completed with recommendations of physical medicine rehab consult.  Patient was admitted for a comprehensive rehab program.  Review of Systems  Constitutional: Negative for chills and fever.  HENT: Negative for hearing loss.   Eyes: Negative for blurred vision and double vision.  Respiratory: Positive for cough and shortness of breath.   Cardiovascular: Positive for leg swelling.  Gastrointestinal: Positive for constipation. Negative for nausea and vomiting.       GERD  Genitourinary: Positive for urgency. Negative for flank pain and  hematuria.  Musculoskeletal: Positive for myalgias.  Skin: Negative for rash.  Neurological: Positive for weakness.  All other systems reviewed and are negative.  Past Medical History:  Diagnosis Date  . Arthritis   . Chronic kidney disease    kidney stones  . Coronary artery disease   . Dyspnea   . Dysrhythmia   . GERD (gastroesophageal reflux disease)   . History of cholelithiasis   . History of kidney stones    ca ox [name] @ [facility]) now [facility]  . History of pneumonia   . HLD (hyperlipidemia)   . HTN (hypertension)   . Jaundice    age 48  . Pneumonia    years ago   . T2DM (type 2 diabetes mellitus) ([place]) 2010   Past Surgical History:  Procedure Laterality Date  . CARPAL TUNNEL RELEASE Bilateral   . CATARACT EXTRACTION, BILATERAL    . COLONOSCOPY  11/2012   11 adenomatous polyps, diverticulosis, rec rpt 1 yr Ardis Hughs)  . COLONOSCOPY  12/2013   3 polyps, diverticulosis, rec rpt 3 yrs Ardis Hughs)  . CORONARY ARTERY BYPASS GRAFT N/A 01/18/2018   Procedure: CORONARY ARTERY BYPASS GRAFTING (CABG) x 3; Using Left Internal Mammary Artery, and Right Greater Saphenous Vein harvested Endoscopically, Coronary Artery Endarterectomy;  Surgeon: Ivin Poot, MD;  Location: [facility];  Service: Open Heart Surgery;  Laterality: N/A;  . KNEE CARTILAGE SURGERY Left   . LEFT HEART CATH AND CORONARY ANGIOGRAPHY N/A 12/21/2017   Procedure: LEFT HEART CATH AND CORONARY ANGIOGRAPHY;  Surgeon: Nelva Bush, MD;  Location: [facility];  Service: Cardiovascular;  Laterality: N/A;  . LITHOTRIPSY    . TEE WITHOUT CARDIOVERSION N/A 01/18/2018   Procedure: TRANSESOPHAGEAL ECHOCARDIOGRAM (TEE);  Surgeon: Ivin Poot, MD;  Location:  [facility];  Service: Open Heart Surgery;  Laterality: N/A;  . UMBILICAL HERNIA REPAIR     with mesh   Family History  Problem Relation Age of Onset  . Alzheimer's disease Mother   . Breast cancer Mother        breast  . Atrial fibrillation Mother   . CAD  Mother   . Stroke Father 37  . Diabetes Father   . CAD Father   . Rectal cancer Maternal Grandfather        rectal  . Colon cancer Maternal Grandfather 29  . Esophageal cancer Neg Hx   . Stomach cancer Neg Hx    Social History:  reports that he has never smoked. He quit smokeless tobacco use about 16 years ago. His smokeless tobacco use included chew. He reports that he drinks alcohol. He reports that he does not use drugs. Allergies: No Known Allergies Medications Prior to Admission  Medication Sig Dispense Refill  . aspirin EC 81 MG tablet Take 1 tablet (81 mg total) by mouth daily. (Patient taking differently: Take 81 mg by mouth at bedtime. )    . carvedilol (COREG) 6.25 MG tablet Take 1 tablet (6.25 mg total) by mouth 2 (two) times daily. 180 tablet 3  . Cholecalciferol (VITAMIN D) 2000 UNITS CAPS Take 2,000 Units by mouth daily.     Marland Kitchen doxazosin (CARDURA) 2 MG tablet Take 1 tablet (2 mg total) by mouth daily. 30 tablet 3  . fenofibrate (TRICOR) 145 MG tablet TAKE 1 TABLET BY MOUTH ONCE DAILY (Patient taking differently: TAKE 1 TABLET BY MOUTH ONCE DAILY AT BEDTIME.) 30 tablet 4  . furosemide (LASIX) 20 MG tablet Take 1 tablet (20 mg total) by mouth daily. 90 tablet 3  . lisinopril (PRINIVIL,ZESTRIL) 20 MG tablet TAKE 1 TABLET BY MOUTH DAILY (Patient taking differently: TAKE 1 TABLET BY MOUTH DAILY AT BEDTIME.) 90 tablet 3  . metFORMIN (GLUCOPHAGE) 500 MG tablet Take 1 tablet (500 mg total) by mouth 2 (two) times daily with a meal. 180 tablet 3  . Multiple Vitamins-Minerals (MACULAR VITAMIN BENEFIT PO) Take 1 tablet by mouth daily.    . nitroGLYCERIN (NITROSTAT) 0.4 MG SL tablet Place 1 tablet (0.4 mg total) under the tongue every 5 (five) minutes as needed for chest pain. 30 tablet prn  . Omega-3 Fatty Acids (FISH OIL) 1200 MG CAPS Take 1,200 mg by mouth 3 (three) times daily.     Marland Kitchen omeprazole (PRILOSEC) 40 MG capsule Take 1 capsule (40 mg total) by mouth daily. (Patient taking  differently: Take 40 mg by mouth daily at 12 noon. ) 30 capsule 11  . simvastatin (ZOCOR) 20 MG tablet TAKE ONE TABLET BY MOUTH EVERY NIGHT AT BEDTIME 90 tablet 3  . sitaGLIPtin (JANUVIA) 50 MG tablet Take 1 tablet (50 mg total) by mouth daily. 30 tablet 11    Drug Regimen Review Drug regimen was reviewed and remains appropriate with no significant issues identified  Home: Home Living Family/patient expects to be discharged to:: Private residence Living Arrangements: Spouse/significant other Available Help at Discharge: Family, Available 24 hours/day Type of Home: House Home Access: Stairs to enter [employer] of Steps: 3 Entrance Stairs-Rails: None Home Layout: One level Bathroom Shower/Tub: Tub/shower unit, Architectural technologist: Handicapped height Bathroom Accessibility: Yes Home Equipment: Environmental consultant - 2 wheels, Bedside commode, Other (comment)(lift chair ) Additional Comments: was indep PTA  Lives With: Spouse   Functional History: Prior Function Level of Independence: Independent Comments: 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  He does report he fatigued more quickly than normal lately   Functional Status:  Mobility: Bed Mobility Overal bed mobility: Needs Assistance Bed Mobility: Rolling, Sidelying to Sit Rolling: +2 for safety/equipment, Min assist Sidelying to sit: Mod assist, +2 for safety/equipment Sit to sidelying: Mod assist, +2 for physical assistance General bed mobility comments: Pt required step by step cues for technique, as well as assist to move LEs onto  bed. Transfers Overall transfer level: Needs assistance Equipment used: Rolling walker (2 wheeled) Transfers: Sit to/from Stand, [employer] Transfers Sit to Stand: Mod assist, Min assist Stand pivot transfers: Min assist, +2 safety/equipment General transfer comment: mod verbal cues for precautions and hand placement.  Min A to move into  standing position, and min A to maneuver RW  Ambulation/Gait Ambulation/Gait assistance: Min assist, Min guard, +2 safety/equipment Ambulation Distance (Feet): 210 Feet Assistive device: Rolling walker (2 wheeled) Gait Pattern/deviations: Step-through pattern, Decreased stride length, Wide base of support, Trunk flexed, Drifts right/left, Antalgic General Gait Details: pt ambulating with min to min  guard with chair follow for safety, using RW with cues not to rely heavily on BUE support, cues for correcting flexed posture. As pt fatigued, he did lean a little more on his UEs.  Pt O2 sats were 88-92 % on RA.  Had to cue for pursed lip breathing and take standing rest breaks when pt would desat.   Needed assist to steer RW at times and stay close to RW as well.   Gait velocity: decreased Gait velocity interpretation: <1.31 ft/sec, indicative of household ambulator    ADL: ADL Overall ADL's : Needs assistance/impaired Eating/Feeding: Independent Grooming: Oral care, Wash/dry face, Wash/dry hands, Set up, Sitting Upper Body Bathing: Minimal assistance, Sitting Lower Body Bathing: Maximal assistance, Sit to/from stand Lower Body Bathing Details (indicate cue type and reason): assist below knees and for buttocks  Upper Body Dressing : Minimal assistance, Sitting Lower Body Dressing: Total assistance, Sit to/from stand Lower Body Dressing Details (indicate cue type and reason): Pt unablet to access bil. feet  Toilet Transfer: +2 for safety/equipment, Moderate assistance, +2 for physical assistance, Ambulation, Comfort height toilet, RW Toilet Transfer Details (indicate cue type and reason): requires mod A +2 to boost into standing  Toileting- Clothing Manipulation and Hygiene: Maximal assistance, Sit to/from stand Functional mobility during ADLs: Moderate assistance, +2 for physical assistance, +2 for safety/equipment, Rolling walker  Cognition: Cognition Overall Cognitive Status:  Impaired/Different from baseline Orientation Level: Oriented X4 Cognition Arousal/Alertness: Awake/alert Behavior During Therapy: Restless Overall Cognitive Status: Impaired/Different from baseline Area of Impairment: Memory Memory: Decreased recall of precautions General Comments: Pt requires mod to max cues to follow precautions   Physical Exam: Blood pressure 128/69, pulse 80, temperature 98.1 F (36.7 C), temperature source Oral, resp. rate 19, height _0  (1.778 m), weight 132.8 kg (292 lb 12.3 oz), SpO2 99 %. Physical Exam  Vitals reviewed. Constitutional: He is oriented to person, place, and time. He appears well-developed.  Morbidly obese  HENT:  Head: Normocephalic and atraumatic.  Eyes: EOM are normal. Right eye exhibits no discharge. Left eye exhibits no discharge.  Neck: Normal range of motion. Neck supple.  Cardiovascular: Normal rate and regular rhythm.  Respiratory: Effort normal and breath sounds normal.  GI: Soft. Bowel sounds are normal.  Musculoskeletal: He exhibits no tenderness.  Anasarca  Neurological: He is alert and oriented to person, place, and time.  Motor: Bilateral upper extremities 4/5 proximal distal Bilateral lower extremities: Hip flexion 3 -/5, knee extension 3+/5, ankle dorsiflexion 4/5  Skin:  Midline chest incision clean and dry  Psychiatric: He has a normal mood and affect. His behavior is normal.    Results for orders placed or performed during the hospital encounter of 01/18/18 (from the past 48 hour(s))  Glucose, capillary     Status: Abnormal   Collection Time: 01/26/18  9:11 PM  Result Value Ref Range   Glucose-Capillary 118 (H) 65 - 99 mg/dL   Comment 1 Notify RN   CBC     Status: Abnormal   Collection Time: 01/27/18  2:55 AM  Result Value Ref Range   WBC 12.3 (H) 4.0 - 10.5 K/uL   RBC 3.25 (L) 4.22 - 5.81 MIL/uL   Hemoglobin 9.6 (L) 13.0 - 17.0 g/dL   HCT 30.6 (L) 39.0 - 52.0 %   MCV 94.2 78.0 - 100.0 fL   MCH 29.5 26.0 -  34.0 pg   MCHC 31.4 30.0 - 36.0 g/dL   RDW 15.4 11.5 - 15.5 %   Platelets 398 150 - 400 K/uL    Comment: Performed at [facility], [place] [address] [place], [place] [address]  Basic metabolic panel     Status: Abnormal   Collection Time: 01/27/18  2:55 AM  Result Value Ref Range   Sodium 137 135 - 145 mmol/L   Potassium 4.2 3.5 - 5.1 mmol/L   Chloride 103 101 - 111 mmol/L   CO2 23 22 - 32 mmol/L   Glucose, Bld 101 (H) 65 - 99 mg/dL   BUN 44 (H) 6 - 20 mg/dL   Creatinine, Ser 1.42 (H) 0.61 - 1.24 mg/dL   Calcium 8.8 (L) 8.9 - 10.3 mg/dL   GFR calc non Af Amer 47 (L) >60 mL/min   GFR calc Af Amer 54 (L) >60 mL/min    Comment: (NOTE) The eGFR has been calculated using the CKD EPI equation. This calculation has not been validated in all clinical situations. eGFR's persistently <60 mL/min signify possible Chronic Kidney Disease.    Anion gap 11 5 - 15    Comment: Performed at [facility] [address] [place], [place] [address]  Glucose, capillary     Status: Abnormal   Collection Time: 01/27/18  6:32 AM  Result Value Ref Range   Glucose-Capillary 116 (H) 65 - 99 mg/dL   Comment 1 Capillary Specimen    Comment 2 Notify RN   Glucose, capillary     Status: Abnormal   Collection Time: 01/27/18 11:49 AM  Result Value Ref Range   Glucose-Capillary 152 (H) 65 - 99 mg/dL   Comment 1 Notify RN   Glucose, capillary     Status: Abnormal   Collection Time: 01/27/18  4:15 PM  Result Value Ref Range   Glucose-Capillary 179 (H) 65 - 99 mg/dL   Comment 1 Notify RN   Glucose, capillary     Status: None   Collection Time: 01/27/18  9:39 PM  Result Value Ref Range   Glucose-Capillary 79 65 - 99 mg/dL  CBC     Status: Abnormal   Collection Time: 01/28/18  4:34 AM  Result Value Ref Range   WBC 11.3 (H) 4.0 - 10.5 K/uL   RBC 3.28 (L) 4.22 - 5.81 MIL/uL   Hemoglobin 9.6 (L) 13.0 - 17.0 g/dL   HCT 30.7 (L) 39.0 - 52.0 %   MCV 93.6  78.0 - 100.0 fL   MCH 29.3 26.0 - 34.0 pg    MCHC 31.3 30.0 - 36.0 g/dL   RDW 15.3 11.5 - 15.5 %   Platelets 416 (H) 150 - 400 K/uL    Comment: Performed at [facility] [address], [place], [place] [address]  Basic metabolic panel     Status: Abnormal   Collection Time: 01/28/18  4:34 AM  Result Value Ref Range   Sodium 139 135 - 145 mmol/L   Potassium 4.2 3.5 - 5.1 mmol/L   Chloride 102 101 - 111 mmol/L   CO2 27 22 - 32 mmol/L   Glucose, Bld 111 (H) 65 - 99 mg/dL   BUN 44 (H) 6 - 20 mg/dL   Creatinine, Ser 1.62 (H) 0.61 - 1.24 mg/dL   Calcium 8.9 8.9 - 10.3 mg/dL   GFR calc non Af Amer 40 (L) >60 mL/min   GFR calc Af Amer 46 (L) >60 mL/min    Comment: (NOTE) The eGFR has been calculated using the CKD EPI equation. This calculation has not been validated in all clinical situations. eGFR's persistently <60 mL/min signify possible Chronic Kidney Disease.    Anion gap 10 5 - 15    Comment: Performed at [facility] [address], [place], [place] [address]  Glucose, capillary     Status: Abnormal   Collection Time: 01/28/18  6:03 AM  Result Value Ref Range   Glucose-Capillary 116 (H) 65 - 99 mg/dL  Glucose, capillary     Status: None   Collection Time: 01/28/18  7:44 AM  Result Value Ref Range   Glucose-Capillary 96 65 - 99 mg/dL   Comment 1 Notify RN    Comment 2 Document in Chart   Glucose, capillary     Status: Abnormal   Collection Time: 01/28/18 11:39 AM  Result Value Ref Range   Glucose-Capillary 145 (H) 65 - 99 mg/dL   Comment 1 Notify RN    Comment 2 Document in Chart   Glucose, capillary     Status: Abnormal   Collection Time: 01/28/18  5:01 PM  Result Value Ref Range   Glucose-Capillary 152 (H) 65 - 99 mg/dL   Comment 1 Notify RN    Comment 2 Document in Chart    Dg Chest 2 View  Result Date: 01/28/2018 CLINICAL DATA:  Recent coronary artery bypass grafting with chest pain EXAM: CHEST - 2 VIEW COMPARISON:  Jan 26, 2018 FINDINGS: Three pacemaker wires are attached to the right heart.  There is a small left pleural effusion with patchy consolidation in the left lower lobe. There is also a focal area of consolidation in the posterior segment of the left upper lobe. There is mild right base atelectasis. Lungs elsewhere clear. There is stable cardiomegaly with pulmonary vascularity normal. No adenopathy. There is aortic atherosclerosis. There is degenerative change in the thoracic spine. No pneumothorax. IMPRESSION: No pneumothorax. Patchy airspace consolidation posterior segment left upper lobe and left base regions with small left pleural effusion. Mild right base atelectasis. Lungs appear similar to recent prior study. Stable cardiomegaly. There is aortic atherosclerosis. Aortic Atherosclerosis (ICD10-I70.0). Electronically Signed   By: Lowella Grip III M.D.   On: 01/28/2018 07:28       Medical Problem List and Plan: 1.  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  Sternal precautions 2.  DVT Prophylaxis/Anticoagulation: Lovenox 30 mg daily.  Monitor for any bleeding episodes 3. Pain Management:  Ultram as needed 4. Mood: Klonopin 1 mg nightly 5. Neuropsych: This patient is capable of making decisions on his own behalf. 6. Skin/Wound Care: Routine skin checks 7. Fluids/Electrolytes/Nutrition: Routine in and outs with follow-up chemistries 8.  Acute blood loss anemia.  Follow-up CBC 9.  Diabetes mellitus with peripheral neuropathy.  Hemoglobin A1c 6.8.  Levemir 14 units twice daily.  Check blood sugars before meals and at bedtime.  Diabetic teaching 10.  Hypertension.  Lopressor 12.5 mg twice daily, amiodarone 200 mg twice daily and Lasix 40 mg daily 11.  CKD stage III.  Follow-up renal services as needed.  Repeat chemistries 12.  Morbid Obesity.  BMI 42.42.  Dietary follow-up 13.  Constipation.  Laxative assistance 14.  Hyperlipidemia.  Zocor  Post Admission Physician Evaluation: 1. Preadmission assessment reviewed and changes made  below. 2. Functional deficits secondary  to debility. 3. Patient is admitted to receive collaborative, interdisciplinary care between the physiatrist, rehab nursing staff, and therapy team. 4. Patient's level of medical complexity and substantial therapy needs in context of that medical necessity cannot be provided at a lesser intensity of care such as a SNF. 5. Patient has experienced substantial functional loss from his/her baseline which was documented above under the "Functional History" and "Functional Status" headings.  Judging by the patient's diagnosis, physical exam, and functional history, the patient has potential for functional progress which will result in measurable gains while on inpatient rehab.  These gains will be of substantial and practical use upon discharge  in facilitating mobility and self-care at the household level. 6. Physiatrist will provide 24 hour management of medical needs as well as oversight of the therapy plan/treatment and provide guidance as appropriate regarding the interaction of the two. 7. 24 hour rehab nursing will assist with safety, skin/wound care, disease management and patient education  and help integrate therapy concepts, techniques,education, etc. 8. PT will assess and treat for/with: Lower extremity strength, range of motion, stamina, balance, functional mobility, safety, adaptive techniques and equipment, wound care, coping skills, pain control, education. Goals are: Mod I. 9. OT will assess and treat for/with: ADL's, functional mobility, safety, upper extremity strength, adaptive techniques and equipment, wound mgt, ego support, and community reintegration.   Goals are: Mod I. Therapy may not proceed with showering this patient. 10. Case Management and Social Worker will assess and treat for psychological issues and discharge planning. 11. Team conference will be held weekly to assess progress toward goals and to determine barriers to  discharge. 12. Patient will receive at least 3 hours of therapy per day at least 5 days per week. 13. ELOS: 4-7 days.       14. Prognosis:  good  I have personally performed a face to face diagnostic evaluation, including, but not limited to relevant history and physical exam findings, of this patient and developed relevant assessment and plan.  Additionally, I have reviewed and concur with the physician assistant's documentation above.  Delice Lesch, MD, ABPMR Lavon Paganini Angiulli, PA-C 01/28/2018

## 2018-01-28 NOTE — Discharge Summary (Signed)
Physician Discharge Summary  Patient ID: [name] [name]. MRN: [id number] DOB/AGE: [birth year] 77 y.o.  Admit date: 01/18/2018 Discharge date: 01/29/2018  Admission Diagnoses: Severe coronary artery disease  Discharge Diagnoses:  Active Problems:   S/P CABG x 3  Patient Active Problem List   Diagnosis Date Noted  . Hypoalbuminemia due to protein-calorie malnutrition ([place])   . Debility 01/28/2018  . Anxiety state   . Acute blood loss anemia   . Type 2 diabetes mellitus with peripheral neuropathy ([facility])   . Stage 3 chronic kidney disease ([place])   . Constipation   . Hyperlipidemia   . Anasarca   . S/P CABG x 3 01/18/2018  . Coronary artery disease involving native coronary artery of native heart with angina pectoris ([place]) 12/29/2017  . Chronic diastolic heart failure ([place]) 12/29/2017  . Hyperlipidemia LDL goal <70 12/29/2017  . Abnormal stress test 12/21/2017  . Chronic inactive rheumatic heart disease 11/24/2017  . Dyspnea 11/05/2017  . Diarrhea 06/14/2017  . DNR (do not resuscitate) 06/14/2017  . Peripheral neuropathy 06/14/2017  . Tinea cruris 04/02/2017  . Health maintenance examination 12/12/2016  . Morbid obesity ([place]) 12/12/2016  . Advanced care planning/counseling discussion 10/30/2014  . Benign prostatic hyperplasia 10/30/2014  . Chronic kidney disease, stage III (moderate) ([place]) 07/08/2013  . Tinea corporis 01/16/2013  . Diabetes mellitus ([place])   . Essential hypertension   . Dyslipidemia   . History of kidney stones   . GERD (gastroesophageal reflux disease)     HPI: 77 year old morbidly obese diabetic non-smoker with symptoms of progressive shortness of breath and exercise intolerance.  Stress test performed after cardiology consult showed inferior lateral scar.  Ejection fraction approximately 45-50%.  Cardiac cath by Dr. END demonstrated moderate to severe diabetic disease with suboptimal targets, occlusion of the RCA and diffuse distal disease of the LAD  and circumflex systems.  LVEDP 15.  Echocardiogram shows EF 45% with a calcified anterior leaflet but without significant mitral stenosis or regurgitation. Patient has frequent ectopy but basic rhythm is sinus  Patient still remains active around the home and yard and enjoys hunting with his family.  He does not use a walker or a scooter when he goes to [place] for his hunting supplies.  His last hospitalization was for kidney stones which required lithotripsy over 5 years ago.  No hospitalizations for heart failure.  He has difficulty with claustrophobia and could not tolerate a sleep study and he cannot close the shower door in the bathroom.  Patient was seen in cardiothoracic surgical consultation by Dr. Darcey Nora who evaluated the patient and studies and agreed with recommendations to proceed with coronary artery bypass grafting.  Discharged Condition: good  Hospital Course: Patient was admitted for coronary artery bypass grafting and on 01/18/2018 he was taken to the operating room where he underwent the below described procedure.  Tolerated well was taken to the surgical intensive care unit in stable condition  Postoperative hospital course: The patient has made fairly steady overall progress throughout the postoperative course.  He initially did require some inotropic support but this has been weaned over time.  He has been placed on amiodarone for atrial fibrillation, initially intravenous with transition to p.o. regimen.  He is maintaining sinus rhythm.  He was weaned from the ventilator without difficulty using standard protocols.  He has required aggressive diuresis for excess postoperative volume.  He has had a postoperative acute renal insufficiency but creatinine has shown a steady improvement over time.  His most  recent creatinine is 1.62 and BUN is 44.  He has been started on Plavix for coronary endarterectomy of the LAD.  His diabetes is been under adequate control using standard  protocols.  Due to his renal insufficiency he has not been resumed on metformin or Januvia as of yet but over time hopefully he can be transition back to his home medication regimen and wean from insulin.  He does have an expected acute blood loss anemia and values are stable with most recent hemoglobin 9.6 and hematocrit 30.7.  He was treated for a possible left lower lobe pneumonia with a course of Fortaz.  He does not have a leukocytosis or fevers at this time. He is showing some steady improvement with his overall rehabilitation but due to decreased functional mobility is felt to be a candidate for a short-term stay in CIR for continued physical therapy.  At the time of transfer to CIR he is felt to be stable from a cardiac and surgical viewpoint.  Consults: rehabilitation medicine  Significant Diagnostic Studies: Routine postoperative serial chest x-rays and laboratories  Treatments: surgery:  OPERATIVE REPORT  DATE OF PROCEDURE:  01/18/2018  PROCEDURE PERFORMED: 1.  Coronary artery bypass grafting x3 (left internal mammary artery to LAD, saphenous vein graft to diagonal, saphenous vein graft to posterolateral branch of RCA). 2.  Coronary endarterectomy of the LAD. 3.  Endoscopic harvest of right leg greater saphenous vein.  SURGEON:  Len Childs, MD  ASSISTANT:  Nicholes Rough, PA-C  ANESTHESIA:  General, Belinda Block, MD   Discharge Exam: Blood pressure 128/69, pulse 80, temperature 98.1 F (36.7 C), temperature source Oral, resp. rate 19, height 5\' 10"  (1.778 m), weight 292 lb 12.3 oz (132.8 kg), SpO2 99 %.   General appearance: alert, cooperative and no distress Heart: regular rate and rhythm Lungs: clear to auscultation bilaterally Abdomen: soft, non-tender; bowel sounds normal; no masses,  no organomegaly Extremities: edema 1-2+ pitting Wound: clean and dry, some erythema at [facility] site     Disposition: To CIR for further rehabailitation  Discharge  Instructions    Amb Referral to Cardiac Rehabilitation   Complete by:  As directed    Diagnosis:  CABG   CABG X ___:  3     Allergies as of 01/28/2018   No Known Allergies     Medication List    ASK your doctor about these medications   aspirin EC 81 MG tablet Take 1 tablet (81 mg total) by mouth daily.   carvedilol 6.25 MG tablet Commonly known as:  COREG Take 1 tablet (6.25 mg total) by mouth 2 (two) times daily.   doxazosin 2 MG tablet Commonly known as:  CARDURA Take 1 tablet (2 mg total) by mouth daily.   fenofibrate 145 MG tablet Commonly known as:  TRICOR TAKE 1 TABLET BY MOUTH ONCE DAILY   Fish Oil 1200 MG Caps Take 1,200 mg by mouth 3 (three) times daily.   furosemide 20 MG tablet Commonly known as:  LASIX Take 1 tablet (20 mg total) by mouth daily.   lisinopril 20 MG tablet Commonly known as:  PRINIVIL,ZESTRIL TAKE 1 TABLET BY MOUTH DAILY   MACULAR VITAMIN BENEFIT PO Take 1 tablet by mouth daily.   metFORMIN 500 MG tablet Commonly known as:  GLUCOPHAGE Take 1 tablet (500 mg total) by mouth 2 (two) times daily with a meal.   nitroGLYCERIN 0.4 MG SL tablet Commonly known as:  NITROSTAT Place 1 tablet (0.4 mg total) under  the tongue every 5 (five) minutes as needed for chest pain.   omeprazole 40 MG capsule Commonly known as:  PRILOSEC Take 1 capsule (40 mg total) by mouth daily.   simvastatin 20 MG tablet Commonly known as:  ZOCOR TAKE ONE TABLET BY MOUTH EVERY NIGHT AT BEDTIME   sitaGLIPtin 50 MG tablet Commonly known as:  JANUVIA Take 1 tablet (50 mg total) by mouth daily.   Vitamin D 2000 units Caps Take 2,000 Units by mouth daily.      Follow-up Information    End, Harrell Gave, MD Follow up.   Specialty:  Cardiology Why:  Your appointment is on 6/20 at 8:00am. Please bring your medication list.  Contact information: [address] [place] [address] [phone]        Ria Bush, MD. Call in 1 day(s).    Specialty:  Family Medicine Contact information: [place] [address] [phone]        Ivin Poot, MD Follow up.   Specialty:  Cardiothoracic Surgery Why:  Your routine follow-up appointment is on 6/26 at 12:00pm (noon). Please arrive at 11:30am for a chest xray located at [facility] which is located on the first floor of our buildings.  Contact information: [place] [address] [phone]          The patient has been discharged on:   1.Beta Blocker:  Yes [  y ]                              No   [   ]                              If No, reason:  2.Ace Inhibitor/ARB: Yes [   ]                                     No  [   n ]                                     If No, reason:renal insuff  3.Statin:   Yes [ y  ]                  No  [   ]                  If No, reason:  4.Ecasa:  Yes  Blue.Reese   ]                  No   [   ]                  If No, reason:  Signed: Elgie Collard 01/29/2018, 9:43 AM

## 2018-01-28 NOTE — Progress Notes (Addendum)
[phone]       [name],[name] [id number]             [phone]      10 Days Post-Op Procedure(s) (LRB): CORONARY ARTERY BYPASS GRAFTING (CABG) x 3; Using Left Internal Mammary Artery, and Right Greater Saphenous Vein harvested Endoscopically, Coronary Artery Endarterectomy (N/A) TRANSESOPHAGEAL ECHOCARDIOGRAM (TEE) (N/A)   Subjective:  No complaints per patient.  Wife is at bedside and took TED hose off since they are too tight.  We do not carry the appropriate size for the patient.  I wil give provide a prescription for the proper size and the wife states she will go get a pair  Objective: Vital signs in last 24 hours: Temp:  [97.5 F (36.4 C)-98.3 F (36.8 C)] 98.3 F (36.8 C) (05/20 0746) Pulse Rate:  [77-96] 96 (05/20 0746) Cardiac Rhythm: Normal sinus rhythm;Heart block (05/20 0701) Resp:  [16-20] 17 (05/20 0746) BP: (114-146)/(67-76) 134/72 (05/20 0746) SpO2:  [92 %-99 %] 92 % (05/20 0746) Weight:  [292 lb 12.3 oz (132.8 kg)] 292 lb 12.3 oz (132.8 kg) (05/20 0408)  Intake/Output from previous day: 05/19 0701 - 05/20 0700 In: 880 [P.O.:830; IV Piggyback:50] Out: 1331 [Urine:1330; Stool:1]  General appearance: alert, cooperative and no distress Heart: regular rate and rhythm Lungs: clear to auscultation bilaterally Abdomen: soft, non-tender; bowel sounds normal; no masses,  no organomegaly Extremities: edema 1-2+ pitting Wound: clean and dry, some erythema at [facility] site  Lab Results: Recent Labs    01/27/18 0255 01/28/18 0434  WBC 12.3* 11.3*  HGB 9.6* 9.6*  HCT 30.6* 30.7*  PLT 398 416*   BMET:  Recent Labs    01/27/18 0255 01/28/18 0434  NA 137 139  K 4.2 4.2  CL 103 102  CO2 23 27  GLUCOSE 101* 111*  BUN 44* 44*  CREATININE 1.42* 1.62*  CALCIUM 8.8* 8.9    PT/INR: No results for input(s): LABPROT, INR in the last 72 hours. ABG    Component Value Date/Time   PHART 7.422 01/22/2018 0332   HCO3 20.6 01/22/2018 0332   TCO2  22 01/22/2018 0332   ACIDBASEDEF 3.0 (H) 01/22/2018 0332   O2SAT 49.8 01/24/2018 0450   CBG (last 3)  Recent Labs    01/27/18 2139 01/28/18 0603 01/28/18 0744  GLUCAP 79 116* 96    Assessment/Plan: S/P Procedure(s) (LRB): CORONARY ARTERY BYPASS GRAFTING (CABG) x 3; Using Left Internal Mammary Artery, and Right Greater Saphenous Vein harvested Endoscopically, Coronary Artery Endarterectomy (N/A) TRANSESOPHAGEAL ECHOCARDIOGRAM (TEE) (N/A)  1. CV- NSR, BP elevated at times- on Lopressor, Amiodarone.. Will watch BP but needs to be a little higher with underlying renal insufficiency, continue ASA, PLavix 2. Pulm- no acute issues, continue IS 3. Renal- creatinine up to 1.62 from 1. 45 this morning, remains edematous on Lasix, his I/O was negative yesterday, will continue diuretics for now, repeat CMP in AM 4. DM- sugars controlled 5. Expected post operative blood loss anemia, mild hgb at 9.6 6. Deconditioning- to CIR once appropriate 7. Dispo- patient stable, creatinine up to 1.62 this morning, will repeat CMP in AM, mild HTN will follow, d/c EPW today, will provide RX for proper size compression stockings, will observe today, if creatinine remains stable for CIR in AM   LOS: 10 days    [name] [name] 01/28/2018   His baseline creat is about 1.5 I feel he is ready for CIR patient examined and medical record reviewed,agree with above  note. [name] [name] 01/28/2018

## 2018-01-28 NOTE — Progress Notes (Signed)
I met with Dr. Prescott Gum at bedside and he is in agreement to d/c today. I iwll make the arrnagemtns to admit today. Wife also at bedside. RN CM and SW made aware. [phone]

## 2018-01-28 NOTE — Progress Notes (Signed)
Pacing wires removed per order. Patient instructed on bed rest and vital signs protocol initiated

## 2018-01-28 NOTE — Care Management Important Message (Signed)
Important Message  Patient Details  Name: [name] [name]. MRN: [id number] Date of Birth: [birth year]   Medicare Important Message Given:  Yes    Alysabeth Scalia 01/28/2018, 3:09 PM

## 2018-01-28 NOTE — Progress Notes (Signed)
Jamse Arn, MD  Physician  Physical Medicine and Rehabilitation  PMR Pre-admission  Addendum  Date of Service:  01/24/2018 2:52 PM       Related encounter: Admission (Current) from 01/18/2018 in [facility] 4E CV SURGICAL PROGRESSIVE CARE           Show:Clear all [x] Manual[x] Template[x] Copied  Added by: [x] Cristina Gong, RN[x] Jamse Arn, MD   [] Hover for details   PMR Admission Coordinator Pre-Admission Assessment  Patient: [name] [name]. is an 77 y.o., male MRN: [id number] DOB: July 26, 1941  Height: 5\' 10"  (177.8 cm) Weight: 132.8 kg (292 lb 12.3 oz)                                                                                                                                                  Insurance Information HMO:     PPO: yes     PCP:      IPA:      80/20:      OTHER: medicare advantage plan PRIMARY: [employer] Medicare      Policy#: [id number]      Subscriber: pt CM Name: Orvan July      Phone#: [phone]     Fax#: [phone] Pre-Cert#: [id number]  F/u with Vevelyn Royals ; approved for 7 days     Employer: retired Benefits:  Phone #: [phone]     Name: 01/24/2018 Eff. Date: 09/11/2017     Deduct: none      Out of Pocket Max: $4000      Life Max: none CIR: $160 co pay per day days 1 until 10      SNF: no co pay days 1 until 20; $50 co pay per day days 21 until 100 Outpatient: $20 co pay per visit     Co-Pay: visits per medical neccesity Home Health: 100%      Co-Pay: visits per medical neccesity DME: 80%     Co-Pay: 20% Providers: in network  SECONDARY: none        Medicaid Application Date:       Case Manager:  Disability Application Date:       Case Worker:   Emergency Publishing copy Information    Name Relation Home Work Mobile   [name] [name] Spouse [phone]  [phone]     Current Medical History  Patient Admitting Diagnosis: debility after CABG  History of Present  Illness: SJG:GEZMOQ V. Kiril, Hippe. is a 77 year old right-handed male with past medical history of hypertension, diabetes mellitus, CKD stage III. Presented 01/18/2018 with increasing dyspnea on exertion and has been followed by cardiothoracic surgery with recent echocardiogram showing ejection fraction of 45% no significant regurgitation as well as chest x-ray unremarkable. EKG was nonspecific. Cardiac catheterization completed demonstrating chronic occlusion of the RCA with high-grade LAD diagonal stenosis.  Underwent CABG x3 01/18/2018 per Dr. Prescott Gum. Hospital course pain management with sternal precautions. Nephrology consulted 01/22/2018 for elevating creatinine postoperatively 3.3 from a baseline 1.6. No acute indications at this time for dialysis. Creatinine stabilizing 1.62and nephrology services have signed off. Acute blood loss anemia 7.3 and monitoredafter being transfused and latest hemoglobin 9.6. Currently maintained on aspirin and Plavix after CABG.initially required milrinone for blood pressure control which is since been weaned off. Subcutaneous Lovenox for DVT prophylaxis. Tolerating a regular diet.   Past Medical History      Past Medical History:  Diagnosis Date  . Arthritis   . Chronic kidney disease    kidney stones  . Coronary artery disease   . Dyspnea   . Dysrhythmia   . GERD (gastroesophageal reflux disease)   . History of cholelithiasis   . History of kidney stones    ca ox [name] @ [facility]) now [place]  . History of pneumonia   . HLD (hyperlipidemia)   . HTN (hypertension)   . Jaundice    age 53  . Pneumonia    years ago   . T2DM (type 2 diabetes mellitus) ([place]) 2010    Family History  family history includes Alzheimer's disease in his mother; Atrial fibrillation in his mother; Breast cancer in his mother; CAD in his father and mother; Colon cancer (age of onset: 62) in his maternal grandfather; Diabetes in his father;  Rectal cancer in his maternal grandfather; Stroke (age of onset: 17) in his father.  Prior Rehab/Hospitalizations:  Has the patient had major surgery during 100 days prior to admission? No  Current Medications   Current Facility-Administered Medications:  .  0.9 %  sodium chloride infusion, 250 mL, Intravenous, PRN, [name], MD .  acetaminophen (TYLENOL) tablet 650 mg, 650 mg, Oral, Q6H PRN, Barrett, Erin R, PA-C, 650 mg at 01/28/18 0917 .  amiodarone (PACERONE) tablet 200 mg, 200 mg, Oral, BID, [name], MD, 200 mg at 01/28/18 0911 .  aspirin EC tablet 81 mg, 81 mg, Oral, Daily, [name], MD, 81 mg at 01/28/18 0912 .  bisacodyl (DULCOLAX) EC tablet 10 mg, 10 mg, Oral, Daily PRN **OR** bisacodyl (DULCOLAX) suppository 10 mg, 10 mg, Rectal, Daily PRN, [name], MD .  clonazePAM Bobbye [place]) tablet 1 mg, 1 mg, Oral, QHS, [name], MD, 1 mg at 01/27/18 2128 .  clopidogrel (PLAVIX) tablet 75 mg, 75 mg, Oral, Daily, [name], MD, 75 mg at 01/28/18 0911 .  enoxaparin (LOVENOX) injection 30 mg, 30 mg, Subcutaneous, Q24H, [name], MD, 30 mg at 01/27/18 2129 .  furosemide (LASIX) tablet 40 mg, 40 mg, Oral, Daily, [name], MD, 40 mg at 01/28/18 0911 .  guaiFENesin (MUCINEX) 12 hr tablet 600 mg, 600 mg, Oral, BID, [name], MD, 600 mg at 01/28/18 0912 .  CBG monitoring, , , 4x Daily, AC & HS **AND** insulin aspart (novoLOG) injection 0-24 Units, 0-24 Units, Subcutaneous, TID AC & HS, [name], MD, 2 Units at 01/28/18 1158 .  insulin detemir (LEVEMIR) injection 14 Units, 14 Units, Subcutaneous, BID, [name], MD, 14 Units at 01/28/18 0911 .  metoprolol tartrate (LOPRESSOR) tablet 12.5 mg, 12.5 mg, Oral, BID, [name], MD, 12.5 mg at 01/28/18 0911 .  ondansetron (ZOFRAN) tablet 4 mg, 4 mg, Oral, Q6H PRN **OR** ondansetron (ZOFRAN) injection 4 mg, 4 mg, Intravenous, Q6H PRN, [name], MD .  pantoprazole (PROTONIX)  EC tablet 40 mg, 40 mg, Oral, QAC breakfast, [name], MD, 40 mg at 01/28/18 0911 .  simvastatin (ZOCOR) tablet 20 mg, 20 mg, Oral, QHS, [name], MD, 20 mg at 01/27/18 2129 .  sodium chloride flush (NS) 0.9 % injection 3 mL, 3 mL, Intravenous, Q12H, [name], MD, 3 mL at 01/28/18 1043 .  sodium chloride flush (NS) 0.9 % injection 3 mL, 3 mL, Intravenous, PRN, [name], MD .  traMADol Veatrice Bourbon) tablet 50 mg, 50 mg, Oral, Q6H PRN, [name], MD, 50 mg at 01/27/18 1610  Patients Current Diet:       Diet Order           Diet heart healthy/carb modified Room service appropriate? Yes; Fluid consistency: Thin  Diet effective now          Precautions / Restrictions Precautions Precautions: Fall, Sternal Precaution Booklet Issued: No Precaution Comments: Pt requires mod to max cues to adhere to sternal precautions.  Pt was instructed to avoid pushing through his arms.  Restrictions Weight Bearing Restrictions: Yes Other Position/Activity Restrictions: sternal precautions   Has the patient had 2 or more falls or a fall with injury in the past year?No  Prior Activity Level Limited Community (1-2x/wk): Independent without AD; becoming more SOB with short distances pta  [place] Devices/Equipment: Blood pressure cuff, Cane (specify quad or straight), CBG Meter, Eyeglasses, Raised toilet seat with rails, Scales Home Equipment: Walker - 2 wheels, Bedside commode, Other (comment)(lift chair )  Prior Device Use: Indicate devices/aids used by the patient prior to current illness, exacerbation or injury? 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  He does report he fatigued more quickly than normal lately    Self Care: Did the patient need help bathing, dressing, using the toilet or eating?  Independent  Indoor Mobility: Did the patient need assistance with walking from room to room (with or without device)? Independent  Stairs: Did the patient need assistance with internal or external stairs (with or without device)? Independent  Functional Cognition: Did the patient need help planning regular tasks such as shopping or remembering to take medications? Independent  Current Functional Level Cognition  Overall Cognitive Status: Impaired/Different from baseline Orientation Level: Oriented X4 General Comments: Pt requires mod to max cues to follow precautions     Extremity Assessment (includes Sensation/Coordination)  Upper Extremity Assessment: Generalized weakness(bil. UEs tremulous )  Lower Extremity Assessment: Defer to PT evaluation    ADLs  Overall ADL's : Needs assistance/impaired Eating/Feeding: Independent Grooming: Oral care, Wash/dry face, Wash/dry hands, Set up, Sitting Upper Body Bathing: Minimal assistance, Sitting Lower Body Bathing: Maximal assistance, Sit to/from stand Lower Body Bathing Details (indicate cue type and reason): assist below knees and for buttocks  Upper Body Dressing : Minimal assistance, Sitting Lower Body Dressing: Total assistance, Sit to/from stand Lower Body Dressing Details (indicate cue type and reason): Pt unablet to access bil. feet  Toilet Transfer: +2 for safety/equipment, Moderate assistance, +2 for physical assistance, Ambulation, Comfort height toilet, RW Toilet Transfer Details (indicate cue type and reason): requires mod A +2 to boost into standing  Toileting- Clothing Manipulation and Hygiene: Maximal assistance, Sit to/from stand Functional mobility during ADLs: Moderate assistance, +2 for physical assistance, +2 for safety/equipment, Rolling walker    Mobility  Overal  bed mobility: Needs Assistance Bed Mobility: Rolling,  Sidelying to Sit Rolling: +2 for safety/equipment, Min assist Sidelying to sit: Mod assist, +2 for safety/equipment Sit to sidelying: Mod assist, +2 for physical assistance General bed mobility comments: Pt required step by step cues for technique, as well as assist to move LEs onto  bed.    Transfers  Overall transfer level: Needs assistance Equipment used: Rolling walker (2 wheeled) Transfers: Sit to/from Stand, [employer] Transfers Sit to Stand: Mod assist, Min assist Stand pivot transfers: Min assist, +2 safety/equipment General transfer comment: mod verbal cues for precautions and hand placement.  Min A to move into standing position, and min A to maneuver RW     Ambulation / Gait / Stairs / Wheelchair Mobility  Ambulation/Gait Ambulation/Gait assistance: Min assist, Min guard, +2 safety/equipment Ambulation Distance (Feet): 210 Feet Assistive device: Rolling walker (2 wheeled) Gait Pattern/deviations: Step-through pattern, Decreased stride length, Wide base of support, Trunk flexed, Drifts right/left, Antalgic General Gait Details: pt ambulating with min to min  guard with chair follow for safety, using RW with cues not to rely heavily on BUE support, cues for correcting flexed posture. As pt fatigued, he did lean a little more on his UEs.  Pt O2 sats were 88-92 % on RA.  Had to cue for pursed lip breathing and take standing rest breaks when pt would desat.   Needed assist to steer RW at times and stay close to RW as well.   Gait velocity: decreased Gait velocity interpretation: <1.31 ft/sec, indicative of household ambulator    Posture / Balance Dynamic Sitting Balance Sitting balance - Comments: static sitting with supervision  Balance Overall balance assessment: Needs assistance Sitting-balance support: Feet supported, No upper extremity supported Sitting balance-Leahy Scale: Fair Sitting balance - Comments: static sitting with supervision  Standing balance support:  Bilateral upper extremity supported Standing balance-Leahy Scale: Poor Standing balance comment: dependent on Use of RW/UE support as well as external support for stability.     Special needs/care consideration BiPAP/CPAP  N/a CPM n/a Continuous Drip IV  N/a Dialysis  N/a Life Vest  N/a Oxygen  N/a Special Bed  N/a Trach Size  N/a Wound Vac n/a Skin surgical incisions; old chest tube sites and pacing wire sites Bowel mgmt: continent LBM 5/18 Bladder mgmt: continent Diabetic mgmt yes pta   Previous Home Environment Living Arrangements: Spouse/significant other  Lives With: Spouse Available Help at Discharge: Family, Available 24 hours/day Type of Home: House Home Layout: One level Home Access: Stairs to enter Entrance Stairs-Rails: None Technical brewer of Steps: 3 Bathroom Shower/Tub: Public librarian, Architectural technologist: Handicapped height Bathroom Accessibility: Yes How Accessible: Accessible via walker [place]: No Additional Comments: was indep PTA  Discharge Living Setting Plans for Discharge Living Setting: Patient's home, Lives with (comment)(wife) Type of Home at Discharge: House Discharge Home Layout: One level Discharge Home Access: Stairs to enter Entrance Stairs-Rails: None(brick wall to the right of steps) Entrance Stairs-Number of Steps: 3 Discharge Bathroom Shower/Tub: Tub/shower unit, Curtain Discharge Bathroom Toilet: Handicapped height Discharge Bathroom Accessibility: Yes How Accessible: Accessible via walker Does the patient have any problems obtaining your medications?: No  Social/Family/Support Systems Patient Roles: Spouse, Parent Contact Information: Barnetta Chapel, wife and daughter, Anne Ng Anticipated Caregiver: wife Anticipated Caregiver's Contact Information: see above Ability/Limitations of Caregiver: none; daughter, Anne Ng, is a Marine scientist at [employer] Availability: 24/7 Discharge Plan Discussed with  Primary Caregiver: Yes Is Caregiver In Agreement with Plan?: No Does Caregiver/Family have  Issues with Lodging/Transportation while Pt is in Rehab?: No   Wife very assertive and direct in conversations.  Goals/Additional Needs Patient/Family Goal for Rehab: Mod I with PT and OT Expected length of stay: ELOS 5 to 9 days Pt/Family Agrees to Admission and willing to participate: Yes Program Orientation Provided & Reviewed with Pt/Caregiver Including Roles  & Responsibilities: Yes  Decrease burden of Care through IP rehab admission: n/a  Possible need for SNF placement upon discharge: not anticipated  Patient Condition: This patient's medical and functional status has changed since the consult dated: 01/24/2018 in which the Rehabilitation Physician determined and documented that the patient's condition is appropriate for intensive rehabilitative care in an inpatient rehabilitation facility. See "History of Present Illness" (above) for medical update. Functional changes are: overall min to mod assist. Patient's medical and functional status update has been discussed with the Rehabilitation physician and patient remains appropriate for inpatient rehabilitation. Will admit to inpatient rehab today.  Preadmission Screen Completed By:   Cleatrice Burke, 01/28/2018 2:28 PM ______________________________________________________________________   Discussed status with Dr. Posey Pronto on 01/28/2018 at  1428 and received telephone approval for admission today.  Admission Coordinator:  Cleatrice Burke, time 9826 Date 01/28/2018         Revision History

## 2018-01-28 NOTE — Progress Notes (Signed)
Physical Therapy Treatment Patient Details Name: [name] [name]. MRN: [id number] DOB: 1941-05-15 Today's Date: 01/28/2018    History of Present Illness [name] [name]. is a 77 y.o. male with past medical history significant for hypertension, diabetes mellitus, hyperlipidemia as well as history of kidney stones and chronic kidney disease.  Seems most recent creatinine is between 1.3 and 1.6.  He presented with shortness of breath-stress test was abnormal, EF 45 % cardiac catheterization demonstrated moderate diffuse vascular disease so underwent an elective coronary artery bypass grafting on 5/10 per Dr. Darcey Nora.      PT Comments    Pt admitted with above diagnosis. Pt currently with functional limitations due to balance and endurance deficits. Pt was able to ambulate with RW with min assist and  mod cues  For technique and safety.  Pt needs incr assist with bed mobility and sit to stand as he needs mod assist for these due to sternal precautions.  Needs Mod cues for all mobility due to precautions. Takes pt several attempts to come to standing.  Pt also needs standing rest breaks and ambulates very slowly with cues needed constantly for pursed lip breathing and upright posture as well as to stay close to Rw.  Pt is ready for CIR.  HR 83-98 bpm.    Pt will benefit from skilled PT to increase their independence and safety with mobility to allow discharge to the venue listed below.     Follow Up Recommendations  CIR     Equipment Recommendations  (TBD in next venue. )    Recommendations for Other Services Rehab consult     Precautions / Restrictions Precautions Precautions: Fall;Sternal Precaution Booklet Issued: No Precaution Comments: Pt requires mod to max cues to adhere to sternal precautions.  Pt was instructed to avoid pushing through his arms.  Restrictions Weight Bearing Restrictions: Yes Other Position/Activity Restrictions: sternal precautions    Mobility  Bed  Mobility Overal bed mobility: Needs Assistance Bed Mobility: Rolling;Sidelying to Sit Rolling: +2 for safety/equipment;Min assist       Sit to sidelying: Mod assist;+2 for physical assistance General bed mobility comments: Pt required step by step cues for technique, as well as assist to move LEs onto  bed.  Transfers Overall transfer level: Needs assistance Equipment used: Rolling walker (2 wheeled) Transfers: Sit to/from [facility] Sit to Stand: Mod assist;Min assist         General transfer comment: mod verbal cues for precautions and hand placement.  Min A to move into standing position, and min A to maneuver RW   Ambulation/Gait Ambulation/Gait assistance: Min assist;Min guard;+2 safety/equipment Ambulation Distance (Feet): 210 Feet Assistive device: Rolling walker (2 wheeled) Gait Pattern/deviations: Step-through pattern;Decreased stride length;Wide base of support;Trunk flexed;Drifts right/left;Antalgic Gait velocity: decreased Gait velocity interpretation: <1.31 ft/sec, indicative of household ambulator General Gait Details: pt ambulating with min to min  guard with chair follow for safety, using RW with cues not to rely heavily on BUE support, cues for correcting flexed posture. As pt fatigued, he did lean a little more on his UEs.  Pt O2 sats were 88-92 % on RA.  Had to cue for pursed lip breathing and take standing rest breaks when pt would desat.   Needed assist to steer RW at times and stay close to RW as well.     Stairs             Wheelchair Mobility    Modified Rankin (Stroke Patients Only)  Balance Overall balance assessment: Needs assistance Sitting-balance support: Feet supported;No upper extremity supported Sitting balance-Leahy Scale: Fair Sitting balance - Comments: static sitting with supervision    Standing balance support: Bilateral upper extremity supported Standing balance-Leahy Scale: Poor Standing balance  comment: dependent on Use of RW/UE support as well as external support for stability.                             Cognition Arousal/Alertness: Awake/alert Behavior During Therapy: Restless Overall Cognitive Status: Impaired/Different from baseline Area of Impairment: Memory                     Memory: Decreased recall of precautions         General Comments: Pt requires mod to max cues to follow precautions       Exercises General Exercises - Lower Extremity Ankle Circles/Pumps: AROM;Both;10 reps;Seated Quad Sets: AROM;10 reps;Both;Supine Long Arc Quad: AROM;Both;10 reps;Seated    General Comments        Pertinent Vitals/Pain Pain Assessment: Faces Faces Pain Scale: Hurts little more Pain Location: buttocks, chest  Pain Descriptors / Indicators: Grimacing Pain Intervention(s): Limited activity within patient's tolerance;Monitored during session;Premedicated before session;Repositioned    Home Living                      Prior Function            PT Goals (current goals can now be found in the care plan section) Acute Rehab PT Goals Patient Stated Goal: to regain independence and to hunt  Progress towards PT goals: Progressing toward goals    Frequency    Min 3X/week      PT Plan Current plan remains appropriate    Co-evaluation              AM-PAC PT "6 Clicks" Daily Activity  Outcome Measure  Difficulty turning over in bed (including adjusting bedclothes, sheets and blankets)?: Unable Difficulty moving from lying on back to sitting on the side of the bed? : Unable Difficulty sitting down on and standing up from a chair with arms (e.g., wheelchair, bedside commode, etc,.)?: A Lot Help needed moving to and from a bed to chair (including a wheelchair)?: A Lot Help needed walking in hospital room?: A Lot Help needed climbing 3-5 steps with a railing? : Total 6 Click Score: 9    End of Session Equipment Utilized  During Treatment: Gait belt Activity Tolerance: Patient tolerated treatment well Patient left: with call bell/phone within reach;with family/visitor present;in bed(nurse pulling pacing wires and wanted pt back in bed) Nurse Communication: Mobility status PT Visit Diagnosis: Unsteadiness on feet (R26.81)     Time: 1540-0867 PT Time Calculation (min) (ACUTE ONLY): 26 min  Charges:  $Gait Training: 23-37 mins                    G Codes:       Kylena Mole,PT Acute Rehabilitation [phone] (pager)    Denice Paradise 01/28/2018, 12:26 PM

## 2018-01-28 NOTE — Progress Notes (Signed)
Received pt. As a new admission.Pt. And his wife have been oriented to the unit routine and protocol.Safety plan was explained,fall prevention plan was explained and signed.

## 2018-01-28 NOTE — Progress Notes (Signed)
[id number] CR came earlier to walk but pt had just walked with PT. Now pt will be transferring to CIR soon. Education completed with pt and wife re sternal precautions and keeping arms in the tube. Stressed importance of IS and flutter valve. Gave diabetic and heart healthy diets. Discussed CRP 2 for after recovery. Will refer to [facility].  Will leave ex ed to Rehab since pt is going to CIR and that will depend on recovery. Graylon Good RN BSN 01/28/2018 3:24 PM

## 2018-01-28 NOTE — Progress Notes (Signed)
Inpatient Rehabilitation Admissions Coordinator   I met with patient at bedside. Wife has left to obtain patient stockings. Noted concerns for creat 1.62 toady. We have insurance approval to admit pt to inpt rehab when felt medically ready. Patient has ambulated with therapy today and continues to need Rehab admit. I contacted Ellwood Handler, PA to question if pt could be admitted to [facility] today with our Medical team to follow up on monitoring of renal status. Noted Nephrology signed off on Friday. We await Dr. Prescott Gum to assess patient today. I will follow.  Danne Baxter, RN, MSN Rehab Admissions Coordinator [phone] 01/28/2018 11:36 AM

## 2018-01-28 NOTE — Progress Notes (Signed)
Physical Medicine and Rehabilitation Consult Reason for Consult: Decreased functional mobility Referring Physician: Dr.Van Kerby Less   HPI: [name] [name]. is a 77 y.o. right-hand male with past medical history of hypertension, diabetes mellitus, CKD stage III.  Per chart review patient was independent prior to admission living with spouse.  Presented 01/18/2018 with increasing dyspnea on exertion and has been followed by cardiothoracic surgery.  Echocardiogram with ejection fraction of 45% and no significant regurgitation as well chest x-ray unremarkable.  EKG nonspecific.  Cardiac catheterization completed demonstrating chronic occlusion of the RCA with high-grade LAD diagonal stenosis.  Underwent CABG x3 01/18/2018 per Dr. Darcey Nora.  Hospital course pain management with sternal precautions.  Nephrology consulted 01/22/2018 for elevated creatinine postoperatively 3.3 from a baseline 1.6.  No acute indications for dialysis at this time.  Creatinine is stabilized 2.06 with gentle IV fluids.  Blood loss anemia 7.7 on monitor.  Patient currently maintained on aspirin and Plavix after CABG.  Lovenox for DVT prophylaxis.  Tressie Ellis was initiated 01/20/2018 for left lower lobe pneumonia monitor.  Currently tolerating a regular diet.  Physical therapy evaluation completed 01/22/2018 recommendations medicine rehab consult.     Review of Systems  Constitutional: Negative for chills and fever.  HENT: Negative for hearing loss.   Eyes: Negative for blurred vision and double vision.  Respiratory: Positive for cough and shortness of breath.   Cardiovascular: Positive for chest pain and leg swelling.  Gastrointestinal:       GERD  Genitourinary: Positive for urgency. Negative for dysuria and flank pain.  Musculoskeletal: Positive for joint pain and myalgias.  Skin: Negative for rash.  All other systems reviewed and are negative.      Past Medical History:  Diagnosis Date  . Arthritis   .  Chronic kidney disease    kidney stones  . Coronary artery disease   . Dyspnea   . Dysrhythmia   . GERD (gastroesophageal reflux disease)   . History of cholelithiasis   . History of kidney stones    ca ox [name] @ [facility]) now [facility]  . History of pneumonia   . HLD (hyperlipidemia)   . HTN (hypertension)   . Jaundice    age 77  . Pneumonia    years ago   . T2DM (type 2 diabetes mellitus) ([place]) 2010        Past Surgical History:  Procedure Laterality Date  . CARPAL TUNNEL RELEASE Bilateral   . CATARACT EXTRACTION, BILATERAL    . COLONOSCOPY  11/2012   11 adenomatous polyps, diverticulosis, rec rpt 1 yr Ardis Hughs)  . COLONOSCOPY  12/2013   3 polyps, diverticulosis, rec rpt 3 yrs Ardis Hughs)  . CORONARY ARTERY BYPASS GRAFT N/A 01/18/2018   Procedure: CORONARY ARTERY BYPASS GRAFTING (CABG) x 3; Using Left Internal Mammary Artery, and Right Greater Saphenous Vein harvested Endoscopically, Coronary Artery Endarterectomy;  Surgeon: Ivin Poot, MD;  Location: [place];  Service: Open Heart Surgery;  Laterality: N/A;  . KNEE CARTILAGE SURGERY Left   . LEFT HEART CATH AND CORONARY ANGIOGRAPHY N/A 12/21/2017   Procedure: LEFT HEART CATH AND CORONARY ANGIOGRAPHY;  Surgeon: Nelva Bush, MD;  Location: [facility];  Service: Cardiovascular;  Laterality: N/A;  . LITHOTRIPSY    . TEE WITHOUT CARDIOVERSION N/A 01/18/2018   Procedure: TRANSESOPHAGEAL ECHOCARDIOGRAM (TEE);  Surgeon: Prescott Gum, Collier Salina, MD;  Location: [facility];  Service: Open Heart Surgery;  Laterality: N/A;  . UMBILICAL HERNIA REPAIR  with mesh   Family History  Problem Relation Age of Onset  . Alzheimer's disease Mother   . Breast cancer Mother        breast  . Atrial fibrillation Mother   . CAD Mother   . Stroke Father 66  . Diabetes Father   . CAD Father   . Rectal cancer Maternal Grandfather        rectal  . Colon cancer Maternal Grandfather 58  . Esophageal  cancer Neg Hx   . Stomach cancer Neg Hx    Social History:  reports that he has never smoked. He quit smokeless tobacco use about 16 years ago. His smokeless tobacco use included chew. He reports that he drinks alcohol. He reports that he does not use drugs. Allergies: No Known Allergies       Medications Prior to Admission  Medication Sig Dispense Refill  . aspirin EC 81 MG tablet Take 1 tablet (81 mg total) by mouth daily. (Patient taking differently: Take 81 mg by mouth at bedtime. )    . carvedilol (COREG) 6.25 MG tablet Take 1 tablet (6.25 mg total) by mouth 2 (two) times daily. 180 tablet 3  . Cholecalciferol (VITAMIN D) 2000 UNITS CAPS Take 2,000 Units by mouth daily.     Marland Kitchen doxazosin (CARDURA) 2 MG tablet Take 1 tablet (2 mg total) by mouth daily. 30 tablet 3  . fenofibrate (TRICOR) 145 MG tablet TAKE 1 TABLET BY MOUTH ONCE DAILY (Patient taking differently: TAKE 1 TABLET BY MOUTH ONCE DAILY AT BEDTIME.) 30 tablet 4  . lisinopril (PRINIVIL,ZESTRIL) 20 MG tablet TAKE 1 TABLET BY MOUTH DAILY (Patient taking differently: TAKE 1 TABLET BY MOUTH DAILY AT BEDTIME.) 90 tablet 3  . metFORMIN (GLUCOPHAGE) 500 MG tablet Take 1 tablet (500 mg total) by mouth 2 (two) times daily with a meal. 180 tablet 3  . Multiple Vitamins-Minerals (MACULAR VITAMIN BENEFIT PO) Take 1 tablet by mouth daily.    . nitroGLYCERIN (NITROSTAT) 0.4 MG SL tablet Place 1 tablet (0.4 mg total) under the tongue every 5 (five) minutes as needed for chest pain. 30 tablet prn  . Omega-3 Fatty Acids (FISH OIL) 1200 MG CAPS Take 1,200 mg by mouth 3 (three) times daily.     Marland Kitchen omeprazole (PRILOSEC) 40 MG capsule Take 1 capsule (40 mg total) by mouth daily. (Patient taking differently: Take 40 mg by mouth daily at 12 noon. ) 30 capsule 11  . simvastatin (ZOCOR) 20 MG tablet TAKE ONE TABLET BY MOUTH EVERY NIGHT AT BEDTIME 90 tablet 3  . sitaGLIPtin (JANUVIA) 50 MG tablet Take 1 tablet (50 mg total) by mouth daily. 30 tablet  11  . furosemide (LASIX) 20 MG tablet Take 1 tablet (20 mg total) by mouth daily. 90 tablet 3    Home: Home Living Family/patient expects to be discharged to:: Inpatient rehab Living Arrangements: Spouse/significant other Additional Comments: was indep PTA  Functional History: Prior Function Level of Independence: Independent Comments: didn't use AD Functional Status:  Mobility: Bed Mobility General bed mobility comments: pt up in chair upon PT arrival Transfers Overall transfer level: Needs assistance Equipment used: None Transfers: Sit to/from Stand Sit to Stand: Mod assist, +2 physical assistance General transfer comment: modAx2 from lower surface, used rocking technique with pt hugging heart pillow, modAx2 to power up and steady upon standing during transition of hands Ambulation/Gait Ambulation/Gait assistance: Min assist, +2 safety/equipment Ambulation Distance (Feet): 60 Feet Assistive device: Rolling walker (2 wheeled) Gait Pattern/deviations: Step-through pattern,  Decreased stride length, Wide base of support General Gait Details: slow, freq standing rest breaks, minimal bilat UE WBing Gait velocity: slow Gait velocity interpretation: <1.8 ft/sec, indicate of risk for recurrent falls  ADL:  Cognition: Cognition Overall Cognitive Status: Within Functional Limits for tasks assessed Orientation Level: Oriented X4 Cognition Arousal/Alertness: Awake/alert Behavior During Therapy: WFL for tasks assessed/performed Overall Cognitive Status: Within Functional Limits for tasks assessed  Blood pressure (!) 150/79, pulse 86, temperature 98 F (36.7 C), temperature source Axillary, resp. rate (!) 23, height 5' 10"  (1.778 m), weight 134.1 kg (295 lb 10.2 oz), SpO2 96 %. Physical Exam  Vitals reviewed. Constitutional: He is oriented to person, place, and time. No distress.  77 year old right-handed obese male  HENT:  Head: Normocephalic.  Eyes: Pupils are equal,  round, and reactive to light.  Neck: Normal range of motion.  Cardiovascular: Normal rate.  Respiratory: Effort normal.  Chest incision cdi  GI: Soft.  Musculoskeletal: He exhibits edema (pedal).  Neurological: He is alert and oriented to person, place, and time.  Motor 4/5 UE, 3/5 HF, KE and 4/5 ADF/PF. Decreased LT distally in both legs  Skin: Skin is warm.  Psychiatric: He has a normal mood and affect.    LabResultsLast24Hours       Results for orders placed or performed during the hospital encounter of 01/18/18 (from the past 24 hour(s))  Glucose, capillary     Status: Abnormal   Collection Time: 01/23/18 11:42 AM  Result Value Ref Range   Glucose-Capillary 140 (H) 65 - 99 mg/dL   Comment 1 Capillary Specimen    Comment 2 Notify RN   Glucose, capillary     Status: Abnormal   Collection Time: 01/23/18  4:21 PM  Result Value Ref Range   Glucose-Capillary 115 (H) 65 - 99 mg/dL   Comment 1 Capillary Specimen    Comment 2 Notify RN   Glucose, capillary     Status: Abnormal   Collection Time: 01/23/18 10:53 PM  Result Value Ref Range   Glucose-Capillary 102 (H) 65 - 99 mg/dL  Basic metabolic panel     Status: Abnormal   Collection Time: 01/24/18  4:44 AM  Result Value Ref Range   Sodium 137 135 - 145 mmol/L   Potassium 3.8 3.5 - 5.1 mmol/L   Chloride 102 101 - 111 mmol/L   CO2 25 22 - 32 mmol/L   Glucose, Bld 123 (H) 65 - 99 mg/dL   BUN 61 (H) 6 - 20 mg/dL   Creatinine, Ser 2.06 (H) 0.61 - 1.24 mg/dL   Calcium 8.3 (L) 8.9 - 10.3 mg/dL   GFR calc non Af Amer 30 (L) >60 mL/min   GFR calc Af Amer 35 (L) >60 mL/min   Anion gap 10 5 - 15  CBC     Status: Abnormal   Collection Time: 01/24/18  4:44 AM  Result Value Ref Range   WBC 6.9 4.0 - 10.5 K/uL   RBC 2.57 (L) 4.22 - 5.81 MIL/uL   Hemoglobin 7.7 (L) 13.0 - 17.0 g/dL   HCT 23.6 (L) 39.0 - 52.0 %   MCV 91.8 78.0 - 100.0 fL   MCH 30.0 26.0 - 34.0 pg   MCHC 32.6 30.0 - 36.0 g/dL     RDW 15.1 11.5 - 15.5 %   Platelets 238 150 - 400 K/uL  Cooxemetry Panel (carboxy, met, total hgb, O2 sat)     Status: Abnormal   Collection Time: 01/24/18  4:50 AM  Result Value Ref Range   Total hemoglobin 11.2 (L) 12.0 - 16.0 g/dL   O2 Saturation 49.8 %   Carboxyhemoglobin 1.1 0.5 - 1.5 %   Methemoglobin 1.4 0.0 - 1.5 %      ImagingResults(Last48hours)  Dg Chest Port 1 View  Result Date: 01/23/2018 CLINICAL DATA:  CABG EXAM: PORTABLE CHEST 1 VIEW COMPARISON:  01/22/2018 FINDINGS: Cardiac enlargement without heart failure. Left lower lobe airspace disease and small left effusion unchanged. Right lung remains clear. Right jugular sheath unchanged in position. No pneumothorax IMPRESSION: Left lower lobe airspace disease and small left effusion stable and unchanged. Negative for heart failure. Electronically Signed   By: Franchot Gallo M.D.   On: 01/23/2018 09:55   Dg Chest Port 1 View  Result Date: 01/22/2018 CLINICAL DATA:  Follow-up post CABG EXAM: PORTABLE CHEST 1 VIEW COMPARISON:  Chest x-rays dated 01/21/2018 and 01/20/2018. FINDINGS: Stable cardiomegaly. Vague opacity at the LEFT lung base, stable, probable atelectasis and/or small pleural effusion. Opacity within the LEFT upper lung is stable, corresponding to the site of a previous chest tube placement. No new lung findings. No pneumothorax seen. RIGHT HA sheath remains in place. No acute or suspicious osseous finding. IMPRESSION: No change compared to yesterday's chest x-ray. Probable atelectasis and/or small pleural effusion at the LEFT lung base. Stable cardiomegaly. Electronically Signed   By: Franki Cabot M.D.   On: 01/22/2018 16:08      Assessment/Plan: Diagnosis: debility after CABG x3  and associated medical issues 1. Does the need for close, 24 hr/day medical supervision in concert with the patient's rehab needs make it unreasonable for this patient to be served in a less intensive setting?  Yes 2. Co-Morbidities requiring supervision/potential complications: DM with peripheral neuropathy, CKD II, htn, morbid obesity 3. Due to bladder management, bowel management, safety, skin/wound care, disease management, medication administration, pain management and patient education, does the patient require 24 hr/day rehab nursing? Yes 4. Does the patient require coordinated care of a physician, rehab nurse, PT (1-2 hrs/day, 5 days/week) and OT (1-2 hrs/day, 5 days/week) to address physical and functional deficits in the context of the above medical diagnosis(es)? Yes Addressing deficits in the following areas: balance, endurance, locomotion, strength, transferring, bowel/bladder control, bathing, dressing, feeding, grooming, toileting and psychosocial support 5. Can the patient actively participate in an intensive therapy program of at least 3 hrs of therapy per day at least 5 days per week? Yes 6. The potential for patient to make measurable gains while on inpatient rehab is excellent 7. Anticipated functional outcomes upon discharge from inpatient rehab are modified independent  with PT, modified independent with OT, n/a with SLP. 8. Estimated rehab length of stay to reach the above functional goals is: 7-10 days 9. Anticipated D/C setting: Home 10. Anticipated post D/C treatments: HH therapy and Outpatient therapy 11. Overall Rehab/Functional Prognosis: excellent  RECOMMENDATIONS: This patient's condition is appropriate for continued rehabilitative care in the following setting: CIR Patient has agreed to participate in recommended program. Yes Note that insurance prior authorization may be required for reimbursement for recommended care.  Comment: Rehab Admissions Coordinator to follow up.  Thanks,  Meredith Staggers, MD, Mellody Drown  I have personally performed a face to face diagnostic evaluation of this patient. Additionally, I have reviewed and concur with the physician assistant's  documentation above.    Lavon Paganini Angiulli, PA-C 01/24/2018          Revision History

## 2018-01-29 ENCOUNTER — Inpatient Hospital Stay (HOSPITAL_COMMUNITY): Payer: Medicare Other | Admitting: Occupational Therapy

## 2018-01-29 ENCOUNTER — Inpatient Hospital Stay (HOSPITAL_COMMUNITY): Payer: Medicare Other | Admitting: Physical Therapy

## 2018-01-29 DIAGNOSIS — E46 Unspecified protein-calorie malnutrition: Secondary | ICD-10-CM

## 2018-01-29 DIAGNOSIS — E8809 Other disorders of plasma-protein metabolism, not elsewhere classified: Secondary | ICD-10-CM

## 2018-01-29 LAB — COMPREHENSIVE METABOLIC PANEL
ALT: 24 U/L (ref 17–63)
AST: 22 U/L (ref 15–41)
Albumin: 2.4 g/dL — ABNORMAL LOW (ref 3.5–5.0)
Alkaline Phosphatase: 49 U/L (ref 38–126)
Anion gap: 8 (ref 5–15)
BUN: 40 mg/dL — AB (ref 6–20)
CHLORIDE: 104 mmol/L (ref 101–111)
CO2: 29 mmol/L (ref 22–32)
Calcium: 8.9 mg/dL (ref 8.9–10.3)
Creatinine, Ser: 1.64 mg/dL — ABNORMAL HIGH (ref 0.61–1.24)
GFR calc Af Amer: 46 mL/min — ABNORMAL LOW (ref >60)
GFR, EST NON AFRICAN AMERICAN: 39 mL/min — AB (ref >60)
Glucose, Bld: 99 mg/dL (ref 65–99)
POTASSIUM: 4.6 mmol/L (ref 3.5–5.1)
Sodium: 141 mmol/L (ref 135–145)
Total Bilirubin: 1 mg/dL (ref 0.3–1.2)
Total Protein: 5.8 g/dL — ABNORMAL LOW (ref 6.5–8.1)

## 2018-01-29 LAB — CBC WITH DIFFERENTIAL/PLATELET
Abs Immature Granulocytes: 0.2 10*3/uL — ABNORMAL HIGH (ref 0.0–0.1)
BASOS ABS: 0 10*3/uL (ref 0.0–0.1)
Basophils Relative: 0 %
EOS ABS: 0.2 10*3/uL (ref 0.0–0.7)
Eosinophils Relative: 2 %
HCT: 29.3 % — ABNORMAL LOW (ref 39.0–52.0)
Hemoglobin: 9.1 g/dL — ABNORMAL LOW (ref 13.0–17.0)
IMMATURE GRANULOCYTES: 2 %
Lymphocytes Relative: 19 %
Lymphs Abs: 2 10*3/uL (ref 0.7–4.0)
MCH: 29 pg (ref 26.0–34.0)
MCHC: 31.1 g/dL (ref 30.0–36.0)
MCV: 93.3 fL (ref 78.0–100.0)
Monocytes Absolute: 0.8 10*3/uL (ref 0.1–1.0)
Monocytes Relative: 8 %
NEUTROS PCT: 69 %
Neutro Abs: 7.5 10*3/uL (ref 1.7–7.7)
Platelets: 419 10*3/uL — ABNORMAL HIGH (ref 150–400)
RBC: 3.14 MIL/uL — AB (ref 4.22–5.81)
RDW: 15.8 % — AB (ref 11.5–15.5)
WBC: 10.9 10*3/uL — ABNORMAL HIGH (ref 4.0–10.5)

## 2018-01-29 LAB — GLUCOSE, CAPILLARY
GLUCOSE-CAPILLARY: 92 mg/dL (ref 65–99)
GLUCOSE-CAPILLARY: 101 mg/dL — AB (ref 65–99)
Glucose-Capillary: 116 mg/dL — ABNORMAL HIGH (ref 65–99)
Glucose-Capillary: 131 mg/dL — ABNORMAL HIGH (ref 65–99)

## 2018-01-29 MED ORDER — PRO-STAT SUGAR FREE PO LIQD
30.0000 mL | Freq: Two times a day (BID) | ORAL | Status: DC
  Administered 2018-01-29 – 2018-02-02 (×9): 30 mL via ORAL
  Filled 2018-01-29 (×9): qty 30

## 2018-01-29 NOTE — Plan of Care (Signed)
Pt pain controlled with current plan of care Pt voids without difficulty Encourage safety precautions to family and patient Patient monitored for constipation  Surgical incision without complications continue to monitor

## 2018-01-29 NOTE — Care Management Note (Signed)
[facility] Individual Statement of Services  Patient Name:  [name] Young.  Date:  01/29/2018  Welcome to the [facility].  Our goal is to provide you with an individualized program based on your diagnosis and situation, designed to meet your specific needs.  With this comprehensive rehabilitation program, you will be expected to participate in at least 3 hours of rehabilitation therapies Monday-Friday, with modified therapy programming on the weekends.  Your rehabilitation program will include the following services:  Physical Therapy (PT), Occupational Therapy (OT), 24 hour per day rehabilitation nursing, Case Management (Social Worker), Rehabilitation Medicine, Nutrition Services and Pharmacy Services  Weekly team conferences will be held on Wednesday to discuss your progress.  Your Social Worker will talk with you frequently to get your input and to update you on team discussions.  Team conferences with you and your family in attendance may also be held.  Expected length of stay: 5-7 days  Overall anticipated outcome: supervision with cueing/set-up  Depending on your progress and recovery, your program may change. Your Social Worker will coordinate services and will keep you informed of any changes. Your Social Worker's name and contact numbers are listed  below.  The following services may also be recommended but are not provided by the [facility] will be made to provide these services after discharge if needed.  Arrangements include referral to agencies that provide these services.  Your insurance has been verified to be:  [employer]-Medicare Your primary doctor is:  Ria Bush  Pertinent information will be shared with your doctor and your insurance company.  Social Worker:  Ovidio Kin, [place] or ([phone]  Information discussed with and copy given to patient by: Elease Hashimoto, 01/29/2018, 9:05 AM

## 2018-01-29 NOTE — Progress Notes (Signed)
Physical Therapy Session Note  Patient Details  Name: [name] Young. MRN: [id number] Date of Birth: Aug 14, 1941  Today's Date: 01/29/2018 PT Individual Time: 1600-1630 PT Individual Time Calculation (min): 30 min   Short Term Goals: Week 1:     Skilled Therapeutic Interventions/Progress Updates:    no c/o pain.  Session focus on functional transfers.  Pt requires cues to adhere to sternal precautions throughout session. Min assist for sit<>stand from all surfaces with tactile cues to maintain forward weight shift and verbal cues for push through LEs.  Stand/pivot to car with min assist, but mod assist needed for LEs into car due to body habitus and height of floor board in simulator car.  Min guard for ambulation within room without device for toileting and hand washing.  Pt repositioned in recliner at end of session with call bell in reach and daughter present.   Therapy Documentation Precautions:  Precautions Precautions: Fall, Sternal Precaution Booklet Issued: No Precaution Comments: Requires min cues to adhere to sternal precautions Restrictions Weight Bearing Restrictions: No Other Position/Activity Restrictions: sternal precautions   See Function Navigator for Current Functional Status.   Therapy/Group: Individual Therapy  Michel Santee 01/29/2018, 4:39 PM

## 2018-01-29 NOTE — Progress Notes (Signed)
  Subjective: Up in chair AM creat stable 1.6 Sternal incision clean,dry NSR  Objective: Vital signs in last 24 hours: Temp:  [97.5 F (36.4 C)-98.4 F (36.9 C)] 98.4 F (36.9 C) (05/21 0452) Pulse Rate:  [53-80] 77 (05/21 0452) Resp:  [16-21] 16 (05/21 0452) BP: (105-142)/(60-70) 127/69 (05/21 0452) SpO2:  [96 %-99 %] 96 % (05/21 0452) Weight:  [296 lb 8.3 oz (134.5 kg)] 296 lb 8.3 oz (134.5 kg) (05/21 0443)  Hemodynamic parameters for last 24 hours:  stable  Intake/Output from previous day: 05/20 0701 - 05/21 0700 In: -  Out: 100 [Urine:100] Intake/Output this shift: Total I/O In: -  Out: 100 [Urine:100]       Exam    General- alert and comfortable    Neck- no JVD, no cervical adenopathy palpable, no carotid bruit   Lungs- clear without rales, wheezes   Cor- regular rate and rhythm, no murmur , gallop   Abdomen- soft, non-tender   Extremities - warm, non-tender, minimal edema   Neuro- oriented, appropriate, no focal weakness   Lab Results: Recent Labs    01/28/18 1917 01/29/18 0627  WBC 10.9* 10.9*  HGB 9.4* 9.1*  HCT 30.8* 29.3*  PLT 411* 419*   BMET:  Recent Labs    01/28/18 0434 01/28/18 1917 01/29/18 0627  NA 139  --  141  K 4.2  --  4.6  CL 102  --  104  CO2 27  --  29  GLUCOSE 111*  --  99  BUN 44*  --  40*  CREATININE 1.62* 1.69* 1.64*  CALCIUM 8.9  --  8.9    PT/INR: No results for input(s): LABPROT, INR in the last 72 hours. ABG    Component Value Date/Time   PHART 7.422 01/22/2018 0332   HCO3 20.6 01/22/2018 0332   TCO2 22 01/22/2018 0332   ACIDBASEDEF 3.0 (H) 01/22/2018 0332   O2SAT 49.8 01/24/2018 0450   CBG (last 3)  Recent Labs    01/28/18 2153 01/29/18 0636 01/29/18 1129  GLUCAP 93 92 116*    Assessment/Plan: S/P  Patient will benefit greatly from In Patient rehab therapies Will follow    LOS: 1 day    [name] Young 01/29/2018

## 2018-01-29 NOTE — Evaluation (Signed)
Physical Therapy Assessment and Plan  Patient Details  Name: [name] Young. MRN: [id number] Date of Birth: 13-May-1941  PT Diagnosis: Abnormality of gait, Difficulty walking, Edema and Muscle weakness Rehab Potential: Good ELOS: 5-7 days   Today's Date: 01/29/2018 PT Individual Time: 0800-0915 PT Individual Time Calculation (min): 75 min    Problem List:  Patient Active Problem List   Diagnosis Date Noted  . Hypoalbuminemia due to protein-calorie malnutrition ([place])   . Debility 01/28/2018  . Anxiety state   . Acute blood loss anemia   . Type 2 diabetes mellitus with peripheral neuropathy ([facility])   . Stage 3 chronic kidney disease ([place])   . Constipation   . Hyperlipidemia   . Anasarca   . S/P CABG x 3 01/18/2018  . Coronary artery disease involving native coronary artery of native heart with angina pectoris ([place]) 12/29/2017  . Chronic diastolic heart failure ([place]) 12/29/2017  . Hyperlipidemia LDL goal <70 12/29/2017  . Abnormal stress test 12/21/2017  . Chronic inactive rheumatic heart disease 11/24/2017  . Dyspnea 11/05/2017  . Diarrhea 06/14/2017  . DNR (do not resuscitate) 06/14/2017  . Peripheral neuropathy 06/14/2017  . Tinea cruris 04/02/2017  . Health maintenance examination 12/12/2016  . Morbid obesity ([place]) 12/12/2016  . Advanced care planning/counseling discussion 10/30/2014  . Benign prostatic hyperplasia 10/30/2014  . Chronic kidney disease, stage III (moderate) ([place]) 07/08/2013  . Tinea corporis 01/16/2013  . Diabetes mellitus ([place])   . Essential hypertension   . Dyslipidemia   . History of kidney stones   . GERD (gastroesophageal reflux disease)     Past Medical History:  Past Medical History:  Diagnosis Date  . Arthritis   . Chronic kidney disease    kidney stones  . Coronary artery disease   . Dyspnea   . Dysrhythmia   . GERD (gastroesophageal reflux disease)   . History of cholelithiasis   . History of kidney stones    ca ox [name] Young @  [facility]) now [place]  . History of pneumonia   . HLD (hyperlipidemia)   . HTN (hypertension)   . Jaundice    age 28  . Pneumonia    years ago   . T2DM (type 2 diabetes mellitus) ([name] Young) 2010   Past Surgical History:  Past Surgical History:  Procedure Laterality Date  . CARPAL TUNNEL RELEASE Bilateral   . CATARACT EXTRACTION, BILATERAL    . COLONOSCOPY  11/2012   11 adenomatous polyps, diverticulosis, rec rpt 1 yr [name] Young)  . COLONOSCOPY  12/2013   3 polyps, diverticulosis, rec rpt 3 yrs [name] Young)  . CORONARY ARTERY BYPASS GRAFT N/A 01/18/2018   Procedure: CORONARY ARTERY BYPASS GRAFTING (CABG) x 3; Using Left Internal Mammary Artery, and Right Greater Saphenous Vein harvested Endoscopically, Coronary Artery Endarterectomy;  Surgeon: Ivin Poot, MD;  Location: [facility];  Service: Open Heart Surgery;  Laterality: N/A;  . KNEE CARTILAGE SURGERY Left   . LEFT HEART CATH AND CORONARY ANGIOGRAPHY N/A 12/21/2017   Procedure: LEFT HEART CATH AND CORONARY ANGIOGRAPHY;  Surgeon: Nelva Bush, MD;  Location: [facility];  Service: Cardiovascular;  Laterality: N/A;  . LITHOTRIPSY    . TEE WITHOUT CARDIOVERSION N/A 01/18/2018   Procedure: TRANSESOPHAGEAL ECHOCARDIOGRAM (TEE);  Surgeon: Prescott Gum, Collier Salina, MD;  Location: [facility];  Service: Open Heart Surgery;  Laterality: N/A;  . UMBILICAL HERNIA REPAIR     with mesh    Assessment & Plan Clinical Impression: [name] Young, [name] is a 77 year old  right-handed male with past medical history of hypertension, diabetes mellitus, CKD stage III.  History taken from chart review, patient, and wife. Patient independent prior to admission living with his wife.  Presented 01/18/2018 with increasing dyspnea on exertion and has been followed by cardiothoracic surgery with recent echocardiogram showing ejection fraction of 45% no significant regurgitation as well as chest x-ray unremarkable.  EKG was nonspecific.  Cardiac catheterization completed  demonstrating chronic occlusion of the RCA with high-grade LAD diagonal stenosis.  Underwent CABG x3 01/18/2018 per Dr. Prescott Gum.  Hospital course pain management with sternal precautions.  Nephrology consulted 01/22/2018 for elevating creatinine postoperatively 3.3 from a baseline 1.6.  No acute indications at this time for dialysis.  Creatinine stabilizing 1.62 and nephrology services have signed off.  Acute blood loss anemia 7.3 and monitored after being transfused and latest hemoglobin 9.6.  Currently maintained on aspirin and Plavix after CABG. Initially required milrinone for blood pressure control which is since been weaned off.  Subcutaneous Lovenox for DVT prophylaxis.  Tolerating a regular diet.  Physical and occupational therapy evaluations completed with recommendations of physical medicine rehab consult.  Patient was admitted for a comprehensive rehab program. Patient transferred to CIR on 01/28/2018 .   Patient currently requires mod with mobility secondary to muscle weakness, decreased cardiorespiratoy endurance and decreased sitting balance, decreased standing balance, decreased postural control, decreased balance strategies and difficulty maintaining precautions.  Prior to hospitalization, patient was independent  with mobility and lived with Spouse in a House home.  Home access is 3Stairs to enter.  Patient will benefit from skilled PT intervention to maximize safe functional mobility, minimize fall risk and decrease caregiver burden for planned discharge home with 24 hour supervision.  Anticipate patient will benefit from follow up [place] at discharge.  PT - End of Session Activity Tolerance: Tolerates 10 - 20 min activity with multiple rests Endurance Deficit: Yes Endurance Deficit Description: fatigues quickly with ADL and mobility activities, requires prolonged seated rest breaks to recover PT Assessment Rehab Potential (ACUTE/IP ONLY): Good PT Barriers to Discharge: Inaccessible home  environment PT Barriers to Discharge Comments: 3 STE home PT Patient demonstrates impairments in the following area(s): Balance;Endurance;Motor;Pain;Safety PT Transfers Functional Problem(s): Bed Mobility;Bed to Chair;Car;Furniture PT Locomotion Functional Problem(s): Ambulation;Wheelchair Mobility;Stairs PT Plan PT Intensity: Minimum of 1-2 x/day ,45 to 90 minutes PT Frequency: 5 out of 7 days PT Duration Estimated Length of Stay: 5-7 days PT Treatment/Interventions: Ambulation/gait training;Discharge planning;Functional mobility training;Psychosocial support;Therapeutic Activities;Wheelchair propulsion/positioning;Therapeutic Exercise;Neuromuscular re-education;Skin care/wound management;Disease management/prevention;Balance/vestibular training;DME/adaptive equipment instruction;Pain management;UE/LE Strength taining/ROM;UE/LE Coordination activities;Stair training;Patient/family education;Community reintegration PT Transfers Anticipated Outcome(s): modI PT Locomotion Anticipated Outcome(s): S with LRAD PT Recommendation Recommendations for Other Services: Therapeutic Recreation consult Follow Up Recommendations: Home health PT;24 hour supervision/assistance Patient destination: Home Equipment Recommended: To be determined Equipment Details: may need RW, TBD  Skilled Therapeutic Intervention Pt received seated in bed, denies pain initially but later reporting pain in buttocks and RN alerted. Performed toileting and dressing tasks in room prior to leaving room d/t pt incontinence in bed and on gown. Fatigues very quickly with short duration mobility tasks and requires frequent prolonged rest breaks throughout. Assessed mobility as described below with minA overall, modA for boosting from lower surfaces such as recliner. Educated pt and wife on rehab process, goals, estimated length of stay all to be determined by team once evaluations completed; pt and wife agreeable. Motivated to "get home as  soon as I can". Remained seated in recliner at end of session  with RN and wife present, all needs in reach.   PT Evaluation Precautions/Restrictions Precautions Precautions: Fall;Sternal Precaution Comments: Requires min cues to adhere to sternal precautions Restrictions Weight Bearing Restrictions: Yes Other Position/Activity Restrictions: sternal precautions General Chart Reviewed: Yes Response to Previous Treatment: Not applicable Family/Caregiver Present: Yes  Pain Pain Assessment Pain Scale: 0-10 Pain Score: 6  Pain Type: Acute pain Pain Location: Back Pain Orientation: Mid;Lower Pain Descriptors / Indicators: Aching Pain Frequency: Intermittent Pain Onset: Gradual Patients Stated Pain Goal: 2 Pain Intervention(s): Medication (See eMAR);Repositioned Multiple Pain Sites: No Home Living/Prior Functioning Home Living Available Help at Discharge: Family;Available 24 hours/day Type of Home: House Home Access: Stairs to enter [employer] of Steps: 3 Entrance Stairs-Rails: Left Home Layout: One level Additional Comments: was indep PTA  Lives With: Spouse Prior Function Level of Independence: Independent with basic ADLs;Independent with homemaking with ambulation;Independent with gait  Able to Take Stairs?: Yes Driving: Yes Vocation: Retired Comments: 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 bear hunting, goes to [employer] for "old mens club" breakfasts  Vision/Perception  Perception Perception: Within Advertising copywriter Praxis Praxis: Intact  Cognition Overall Cognitive Status: Within Functional Limits for tasks assessed Arousal/Alertness: Awake/alert Orientation Level: Oriented X4 Memory: Appears intact Awareness: Appears intact Problem Solving: Appears intact Safety/Judgment: Appears intact Sensation Sensation Light Touch: Appears Intact Proprioception: Appears Intact Coordination Gross Motor Movements are Fluid  and Coordinated: No Finger Nose Finger Test: decreased excursion Motor  Motor Motor - Skilled Clinical Observations: generalized weakness  Mobility Bed Mobility Bed Mobility: Supine to Sit Supine to Sit: HOB elevated;5: Supervision Supine to Sit Details: Verbal cues for sequencing;Verbal cues for technique;Verbal cues for precautions/safety Transfers Transfers: Yes Sit to Stand: 4: Min assist;Without upper extremity assist;From elevated surface Sit to Stand Details: Verbal cues for technique;Verbal cues for precautions/safety;Verbal cues for sequencing Stand Pivot Transfers: 4: Min Psychologist, occupational Details: Verbal cues for sequencing;Verbal cues for technique;Verbal cues for safe use of DME/AE;Verbal cues for precautions/safety Locomotion  Ambulation Ambulation: Yes Ambulation/Gait Assistance: 4: Min guard Ambulation Distance (Feet): 80 Feet Assistive device: Rolling walker Gait Gait: Yes Gait Pattern: Impaired Gait Pattern: Step-through pattern;Decreased stride length;Poor foot clearance - left;Poor foot clearance - right;Decreased trunk rotation;Right foot flat;Left foot flat Gait velocity: significantly decreased Stairs / Additional Locomotion Stairs: No Architect: Yes Wheelchair Assistance: 4: Advertising account executive Details: Verbal cues for technique;Verbal cues for Information systems manager: Both lower extermities Wheelchair Parts Management: Needs assistance Distance: 68'  Trunk/Postural Assessment  Cervical Assessment Cervical Assessment: Within Functional Limits Thoracic Assessment Thoracic Assessment: Within Functional Limits Lumbar Assessment Lumbar Assessment: Within Functional Limits Postural Control Postural Control: Deficits on evaluation(delayed stepping/righting reactions in standing)  Balance Balance Balance Assessed: Yes Static Sitting Balance Static Sitting - Balance Support: No upper  extremity supported Static Sitting - Level of Assistance: 6: Modified independent (Device/Increase time) Dynamic Sitting Balance Dynamic Sitting - Balance Support: No upper extremity supported;During functional activity Dynamic Sitting - Level of Assistance: 5: Stand by assistance Dynamic Sitting - Balance Activities: Lateral lean/weight shifting;Forward lean/weight shifting;Reaching for objects Static Standing Balance Static Standing - Balance Support: Bilateral upper extremity supported Static Standing - Level of Assistance: 5: Stand by assistance Dynamic Standing Balance Dynamic Standing - Balance Support: During functional activity;Bilateral upper extremity supported Dynamic Standing - Level of Assistance: 4: Min assist Dynamic Standing - Balance Activities: Lateral lean/weight shifting;Forward lean/weight shifting;Reaching for objects Extremity Assessment  RUE Assessment RUE Assessment:  Within Functional Limits(AROM WFLS within limitations of sternal precautions.  Strength at least 3+/5 but not formally tested. ) LUE Assessment LUE Assessment: Within Functional Limits(AROM WFLS within limitations of sternal precautions.  Strength at least 3+/5 but not formally tested.) RLE Assessment RLE Assessment: Exceptions to WFL(grossly 4/5 throughout) LLE Assessment LLE Assessment: Exceptions to WFL(grossly 4/5 throughout)   See Function Navigator for Current Functional Status.   Refer to Care Plan for Long Term Goals  Recommendations for other services: Therapeutic Recreation  Kitchen group and Outing/community reintegration  Discharge Criteria: Patient will be discharged from PT if patient refuses treatment 3 consecutive times without medical reason, if treatment goals not met, if there is a change in medical status, if patient makes no progress towards goals or if patient is discharged from hospital.  The above assessment, treatment plan, treatment alternatives and goals were discussed  and mutually agreed upon: by patient and by family  [name] 01/29/2018, 9:42 AM

## 2018-01-29 NOTE — Progress Notes (Signed)
Occupational Therapy Session Note  Patient Details  Name: [name] Young. MRN: [id number] Date of Birth: 18-Aug-1941  Today's Date: 01/29/2018 OT Individual Time: 0349-1791 OT Individual Time Calculation (min): 51 min    Short Term Goals: Week 1:  OT Short Term Goal 1 (Week 1): STGs equal to LTGs set at supervision to modified independence.  Skilled Therapeutic Interventions/Progress Updates:  Pt completed functional mobility to the ortho gym with use of the RW and min guard assist.  Once in the gym, therapist provided education on use of AE for donning shoes (shoe funnel, and reacher).  He was able to return demonstrate with supervision.  Next had pt ambulate to the main therapy gym with min guard assist as well.  Noted smaller step length and increased time needed.  Oxygen sats 96% or greater on room air.  HR in the low 80s as well.  Finished session with functional mobility back to the room with pt completing toileting in standing with min guard assist.  Pt's daughter present at session as well.  Discussed expectations of goals and ELOS.  She reports wanting to have him ambulating on uneven surfaces so he can go to his garden and also to watch his grandchildren play baseball.  Pt left in bedside recliner at end of session with call button and phone in reach and daughter present in room.     Therapy Documentation Precautions:  Precautions Precautions: Fall, Sternal Precaution Booklet Issued: No Precaution Comments: Requires min cues to adhere to sternal precautions Restrictions Weight Bearing Restrictions: No Other Position/Activity Restrictions: sternal precautions  Pain: Pain Assessment Pain Scale: 0-10 Pain Score: 0-No pain Faces Pain Scale: Hurts a little bit Pain Location: Buttocks Pain Descriptors / Indicators: Discomfort Pain Onset: On-going Pain Intervention(s): Repositioned ADL: See Function Navigator for Current Functional Status.   Therapy/Group: Individual  Therapy  Avabella Wailes OTR/L 01/29/2018, 4:14 PM

## 2018-01-29 NOTE — Evaluation (Signed)
Occupational Therapy Assessment and Plan  Patient Details  Name: [name] Young. MRN: [id number] Date of Birth: 12/10/1940  OT Diagnosis: muscle weakness (generalized) Rehab Potential: Rehab Potential (ACUTE ONLY): Excellent ELOS: 5-7 days   Today's Date: 01/29/2018 OT Individual Time: 1001-1101 OT Individual Time Calculation (min): 60 min     Problem List:  Patient Active Problem List   Diagnosis Date Noted  . Hypoalbuminemia due to protein-calorie malnutrition ([place])   . Debility 01/28/2018  . Anxiety state   . Acute blood loss anemia   . Type 2 diabetes mellitus with peripheral neuropathy (HCC)   . Stage 3 chronic kidney disease ([place])   . Constipation   . Hyperlipidemia   . Anasarca   . S/P CABG x 3 01/18/2018  . Coronary artery disease involving native coronary artery of native heart with angina pectoris ([place]) 12/29/2017  . Chronic diastolic heart failure ([place]) 12/29/2017  . Hyperlipidemia LDL goal <70 12/29/2017  . Abnormal stress test 12/21/2017  . Chronic inactive rheumatic heart disease 11/24/2017  . Dyspnea 11/05/2017  . Diarrhea 06/14/2017  . DNR (do not resuscitate) 06/14/2017  . Peripheral neuropathy 06/14/2017  . Tinea cruris 04/02/2017  . Health maintenance examination 12/12/2016  . Morbid obesity ([place]) 12/12/2016  . Advanced care planning/counseling discussion 10/30/2014  . Benign prostatic hyperplasia 10/30/2014  . Chronic kidney disease, stage III (moderate) ([place]) 07/08/2013  . Tinea corporis 01/16/2013  . Diabetes mellitus ([place])   . Essential hypertension   . Dyslipidemia   . History of kidney stones   . GERD (gastroesophageal reflux disease)     Past Medical History:  Past Medical History:  Diagnosis Date  . Arthritis   . Chronic kidney disease    kidney stones  . Coronary artery disease   . Dyspnea   . Dysrhythmia   . GERD (gastroesophageal reflux disease)   . History of cholelithiasis   . History of kidney stones    ca ox [name]  @ [facility]) now [place]  . History of pneumonia   . HLD (hyperlipidemia)   . HTN (hypertension)   . Jaundice    age 42  . Pneumonia    years ago   . T2DM (type 2 diabetes mellitus) ([place]) 2010   Past Surgical History:  Past Surgical History:  Procedure Laterality Date  . CARPAL TUNNEL RELEASE Bilateral   . CATARACT EXTRACTION, BILATERAL    . COLONOSCOPY  11/2012   11 adenomatous polyps, diverticulosis, rec rpt 1 yr Ardis Hughs)  . COLONOSCOPY  12/2013   3 polyps, diverticulosis, rec rpt 3 yrs Ardis Hughs)  . CORONARY ARTERY BYPASS GRAFT N/A 01/18/2018   Procedure: CORONARY ARTERY BYPASS GRAFTING (CABG) x 3; Using Left Internal Mammary Artery, and Right Greater Saphenous Vein harvested Endoscopically, Coronary Artery Endarterectomy;  Surgeon: Ivin Poot, MD;  Location: [facility];  Service: Open Heart Surgery;  Laterality: N/A;  . KNEE CARTILAGE SURGERY Left   . LEFT HEART CATH AND CORONARY ANGIOGRAPHY N/A 12/21/2017   Procedure: LEFT HEART CATH AND CORONARY ANGIOGRAPHY;  Surgeon: Nelva Bush, MD;  Location: [facility];  Service: Cardiovascular;  Laterality: N/A;  . LITHOTRIPSY    . TEE WITHOUT CARDIOVERSION N/A 01/18/2018   Procedure: TRANSESOPHAGEAL ECHOCARDIOGRAM (TEE);  Surgeon: Prescott Gum, Collier Salina, MD;  Location: [facility] [place];  Service: Open Heart Surgery;  Laterality: N/A;  . UMBILICAL HERNIA REPAIR     with mesh    Assessment & Plan Clinical Impression: Patient is a 77 y.o. year old male  with recent admission to the hospital on  01/18/2018 with increasing dyspnea on exertion and has been followed by cardiothoracic surgery with recent echocardiogram showing ejection fraction of 45% no significant regurgitation as well as chest x-ray unremarkable.  EKG was nonspecific.  Cardiac catheterization completed demonstrating chronic occlusion of the RCA with high-grade LAD diagonal stenosis.  Underwent CABG x3 01/18/2018 per Dr. Prescott Gum.  Hospital course pain management with sternal precautions     Patient transferred to CIR on 01/28/2018 .    Patient currently requires min with basic self-care skills secondary to muscle weakness, decreased cardiorespiratoy endurance and decreased standing balance and decreased balance strategies.  Prior to hospitalization, patient could complete ADLs with independent .  Patient will benefit from skilled intervention to decrease level of assist with basic self-care skills and increase independence with basic self-care skills prior to discharge home with care partner.  Anticipate patient will require 24 hour supervision and follow up home health.  OT - End of Session Activity Tolerance: Decreased this session Endurance Deficit: Yes Endurance Deficit Description: Pt reported fatigue with toileting as well as demonstrating dyspnea. OT Assessment Rehab Potential (ACUTE ONLY): Excellent OT Patient demonstrates impairments in the following area(s): Balance;Endurance;Pain OT Basic ADL's Functional Problem(s): Grooming;Bathing;Dressing;Toileting OT Transfers Functional Problem(s): Toilet;Tub/Shower OT Plan OT Intensity: Minimum of 1-2 x/day, 45 to 90 minutes OT Frequency: 5 out of 7 days OT Duration/Estimated Length of Stay: 5-7 days OT Treatment/Interventions: Balance/vestibular training;Discharge planning;Pain management;Self Care/advanced ADL retraining;Therapeutic Activities;Functional mobility training;Patient/family education;Therapeutic Exercise;DME/adaptive equipment instruction;Community reintegration;Neuromuscular re-education;UE/LE Strength taining/ROM OT Self Feeding Anticipated Outcome(s): independent OT Basic Self-Care Anticipated Outcome(s): supervision to min assist (donning TEDs and shoes) OT Toileting Anticipated Outcome(s): supervision OT Bathroom Transfers Anticipated Outcome(s): supervision OT Recommendation Patient destination: Home Follow Up Recommendations: 24 hour supervision/assistance Equipment Recommended: 3 in 1 bedside  comode(wide 3:1)   Skilled Therapeutic Intervention Pt began working on selfcare retraining sit to stand at the sink as well as toileting and toilet transfers.  He was able to complete sit to stand from the bedside recliner with wheelchair cushion in place with overall min assist, following sternal precautions.  Min assist for transfer to the toilet without assistive device secondary to needing to go urgently.  Once on the seat he was able to complete toilet hygiene in sitting with lateral lean to the left.  Min assist for sit to stand from the slightly elevated toilet with min guard for balance while managing clothing over hips.  He was not able to reach his feet for any dressing or washing this am.  Increased fatigue and dyspnea noted with these tasks but HR remained in the low 80s and O2 in the upper 90s.  Pt left in bedside recliner with call button and phone in reach and spouse present.    OT Evaluation Precautions/Restrictions  Precautions Precautions: Fall;Sternal Precaution Booklet Issued: No Precaution Comments: Requires min cues to adhere to sternal precautions Restrictions Weight Bearing Restrictions: Yes(strernal precautions) Other Position/Activity Restrictions: sternal precautions  Vital Signs 97% O2 on room air and HR at 81 BPM  Pain Pain Assessment Pain Scale: Faces Pain Score: 2  Faces Pain Scale: Hurts a little bit Pain Type: Acute pain Pain Location: Buttocks Pain Orientation: Mid;Lower Pain Descriptors / Indicators: Discomfort Pain Frequency: Intermittent Pain Onset: On-going Patients Stated Pain Goal: 2 Pain Intervention(s): Repositioned Multiple Pain Sites: No Home Living/Prior Functioning Home Living Family/patient expects to be discharged to:: Private residence Living Arrangements: Spouse/significant other Available Help at Discharge: Family, Available 24  hours/day Type of Home: House Home Access: Stairs to enter [employer] of Steps:  3 Entrance Stairs-Rails: Left Home Layout: One level Bathroom Shower/Tub: Tub/shower unit, Architectural technologist: Handicapped height Additional Comments: was indep PTA  Lives With: Spouse IADL History Homemaking Responsibilities: Yes Current License: Yes Mode of Transportation: (truck) Occupation: Retired Leisure and Hobbies: Likes to garden and hunt Prior Function Level of Independence: Independent with basic ADLs  Able to [place]?: Yes Driving: Yes Vocation: Retired Comments: 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 bear hunting, goes to [employer] for "old mens club" breakfasts  ADL  See Function Section of chart for details  Vision Baseline Vision/History: No visual deficits Patient Visual Report: No change from baseline Vision Assessment?: No apparent visual deficits Perception  Perception: Within Functional Limits Praxis Praxis: Intact Cognition Overall Cognitive Status: Within Functional Limits for tasks assessed Arousal/Alertness: Awake/alert Orientation Level: Person;Place;Situation Person: Oriented Place: Oriented Situation: Oriented Year: 2019 Month: May Day of Week: Correct Memory: Appears intact Immediate Memory Recall: Sock;Bed;Blue Memory Recall: Sock;Blue;Bed Memory Recall Sock: Without Cue Memory Recall Blue: Without Cue Memory Recall Bed: Without Cue Attention: Sustained Awareness: Appears intact Problem Solving: Appears intact Safety/Judgment: Appears intact Sensation Sensation Light Touch: Appears Intact Stereognosis: Appears Intact Hot/Cold: Appears Intact Proprioception: Appears Intact Coordination Gross Motor Movements are Fluid and Coordinated: Yes(UE coordination WFLS) Fine Motor Movements are Fluid and Coordinated: Yes Finger Nose Finger Test: decreased excursion Motor  Motor Motor - Skilled Clinical Observations: generalized weakness Mobility  Bed Mobility Bed Mobility: Supine to  Sit Supine to Sit: HOB elevated;5: Supervision Supine to Sit Details: Verbal cues for sequencing;Verbal cues for technique;Verbal cues for precautions/safety Transfers Sit to Stand: 4: Min assist;Without upper extremity assist;From elevated surface Sit to Stand Details: Verbal cues for technique;Verbal cues for precautions/safety;Verbal cues for sequencing  Trunk/Postural Assessment  Cervical Assessment Cervical Assessment: Within Functional Limits Thoracic Assessment Thoracic Assessment: Within Functional Limits Lumbar Assessment Lumbar Assessment: Within Functional Limits Postural Control Postural Control: Deficits on evaluation(delayed stepping/righting reactions in standing)  Balance Balance Balance Assessed: Yes Static Sitting Balance Static Sitting - Balance Support: No upper extremity supported Static Sitting - Level of Assistance: 6: Modified independent (Device/Increase time) Dynamic Sitting Balance Dynamic Sitting - Balance Support: No upper extremity supported;During functional activity Dynamic Sitting - Level of Assistance: 5: Stand by assistance Dynamic Sitting - Balance Activities: Lateral lean/weight shifting;Forward lean/weight shifting;Reaching for objects Static Standing Balance Static Standing - Balance Support: No upper extremity supported Static Standing - Level of Assistance: 5: Stand by assistance Dynamic Standing Balance Dynamic Standing - Balance Support: During functional activity Dynamic Standing - Level of Assistance: 4: Min assist Dynamic Standing - Balance Activities: Lateral lean/weight shifting;Forward lean/weight shifting;Reaching for objects Extremity/Trunk Assessment RUE Assessment RUE Assessment: Within Functional Limits(AROM WFLS within limitations of sternal precautions.  Strength at least 3+/5 but not formally tested. ) LUE Assessment LUE Assessment: Within Functional Limits(AROM WFLS within limitations of sternal precautions.  Strength at  least 3+/5 but not formally tested.)   See Function Navigator for Current Functional Status.   Refer to Care Plan for Long Term Goals  Recommendations for other services: None    Discharge Criteria: Patient will be discharged from OT if patient refuses treatment 3 consecutive times without medical reason, if treatment goals not met, if there is a change in medical status, if patient makes no progress towards goals or if patient is discharged from hospital.  The above assessment, treatment plan,  treatment alternatives and goals were discussed and mutually agreed upon: by patient and by family  , OTR/L 01/29/2018, 12:44 PM

## 2018-01-29 NOTE — Progress Notes (Signed)
Social Work  Social Work Assessment and Plan  Patient Details  Name: [name] Young. MRN: [id number] Date of Birth: June 26, 1941  Today's Date: 01/29/2018  Problem List:  Patient Active Problem List   Diagnosis Date Noted  . Hypoalbuminemia due to protein-calorie malnutrition ([place])   . Debility 01/28/2018  . Anxiety state   . Acute blood loss anemia   . Type 2 diabetes mellitus with peripheral neuropathy (HCC)   . Stage 3 chronic kidney disease ([place])   . Constipation   . Hyperlipidemia   . Anasarca   . S/P CABG x 3 01/18/2018  . Coronary artery disease involving native coronary artery of native heart with angina pectoris ([place]) 12/29/2017  . Chronic diastolic heart failure ([place]) 12/29/2017  . Hyperlipidemia LDL goal <70 12/29/2017  . Abnormal stress test 12/21/2017  . Chronic inactive rheumatic heart disease 11/24/2017  . Dyspnea 11/05/2017  . Diarrhea 06/14/2017  . DNR (do not resuscitate) 06/14/2017  . Peripheral neuropathy 06/14/2017  . Tinea cruris 04/02/2017  . Health maintenance examination 12/12/2016  . Morbid obesity ([place]) 12/12/2016  . Advanced care planning/counseling discussion 10/30/2014  . Benign prostatic hyperplasia 10/30/2014  . Chronic kidney disease, stage III (moderate) ([place]) 07/08/2013  . Tinea corporis 01/16/2013  . Diabetes mellitus ([place])   . Essential hypertension   . Dyslipidemia   . History of kidney stones   . GERD (gastroesophageal reflux disease)    Past Medical History:  Past Medical History:  Diagnosis Date  . Arthritis   . Chronic kidney disease    kidney stones  . Coronary artery disease   . Dyspnea   . Dysrhythmia   . GERD (gastroesophageal reflux disease)   . History of cholelithiasis   . History of kidney stones    ca ox [name] @ [facility]) now [place]  . History of pneumonia   . HLD (hyperlipidemia)   . HTN (hypertension)   . Jaundice    age 77  . Pneumonia    years ago   . T2DM (type 2 diabetes mellitus) ([place])  2010   Past Surgical History:  Past Surgical History:  Procedure Laterality Date  . CARPAL TUNNEL RELEASE Bilateral   . CATARACT EXTRACTION, BILATERAL    . COLONOSCOPY  11/2012   11 adenomatous polyps, diverticulosis, rec rpt 1 yr Ardis Hughs)  . COLONOSCOPY  12/2013   3 polyps, diverticulosis, rec rpt 3 yrs Ardis Hughs)  . CORONARY ARTERY BYPASS GRAFT N/A 01/18/2018   Procedure: CORONARY ARTERY BYPASS GRAFTING (CABG) x 3; Using Left Internal Mammary Artery, and Right Greater Saphenous Vein harvested Endoscopically, Coronary Artery Endarterectomy;  Surgeon: Ivin Poot, MD;  Location: [facility];  Service: Open Heart Surgery;  Laterality: N/A;  . KNEE CARTILAGE SURGERY Left   . LEFT HEART CATH AND CORONARY ANGIOGRAPHY N/A 12/21/2017   Procedure: LEFT HEART CATH AND CORONARY ANGIOGRAPHY;  Surgeon: Nelva Bush, MD;  Location: [facility];  Service: Cardiovascular;  Laterality: N/A;  . LITHOTRIPSY    . TEE WITHOUT CARDIOVERSION N/A 01/18/2018   Procedure: TRANSESOPHAGEAL ECHOCARDIOGRAM (TEE);  Surgeon: Prescott Gum, Collier Salina, MD;  Location: [facility];  Service: Open Heart Surgery;  Laterality: N/A;  . UMBILICAL HERNIA REPAIR     with mesh   Social History:  reports that he has never smoked. He quit smokeless tobacco use about 16 years ago. His smokeless tobacco use included chew. He reports that he drinks alcohol. He reports that he does not use drugs.  Family / [place]  Marital Status: Married Patient Roles: Spouse, Parent, Other (Comment)(grandfather) Spouse/Significant Other: Barnetta Chapel [phone] Children: Annette-daughter lives in [place] Other Supports: friends and church member Anticipated Caregiver: Wife Ability/Limitations of Caregiver: Wife is in good health and daughter is a Therapist, sports in [employer] and works along with having a family Caregiver Availability: 24/7 Family Dynamics: Close knit family daughter and her family are very supportive and involved. They have friends and church  members who visit and provide support.  Social History Preferred language: English Religion: Non-Denominational Cultural Background: No issues Education: High School Read: Yes Write: Yes Employment Status: Retired Freight forwarder Issues: No issues Guardian/Conservator: none-according to MD pt is capable of making his own decisions while here. Wife plans to be here daily and pt wants her included in any decisions   Abuse/Neglect Abuse/Neglect Assessment Can Be Completed: Yes Physical Abuse: Denies Verbal Abuse: Denies Sexual Abuse: Denies Exploitation of patient/patient's resources: Denies Self-Neglect: Denies  Emotional Status Pt's affect, behavior adn adjustment status: Pt is exhausted from one therpay this am. He is very deconditioned and it will take time for him to recover from his heart surgery. His wife is hopeful he will do better than he was doing at home, due to was having issues there until came into the hospital Recent Psychosocial Issues: other health issues were being managed prior to admission Pyschiatric History: No history deferred depression screen due to pt is coping appropriately with his surgery and dealing with it. He feels he should do better once healed since his heart was not functioning well before his surgery. Wife agrees with this and feels he is his old self. Substance Abuse History: No issues  Patient / Family Perceptions, Expectations & Goals Pt/Family understanding of illness & functional limitations: Pt and wife can explain his surgery and precautions he is doing well considering he had major surgery. They both talk with the MD's involved and feel they have a good understanding of his treatment plan moving forward. Wants to get home but needs to be stronger before he goes. Premorbid pt/family roles/activities: Husband, father, grandfather, friend, retiree, Retail banker, church member, Social research officer, government Anticipated changes in roles/activities/participation:  resume Pt/family expectations/goals: Pt states: " I want to be able to get around on my own I am a big man my wife can't physically help me."  Wife states: " I hope he gets stronger and is able to be mobile before coming home."  [employer]: Other (Comment)(had in past) Premorbid Home Care/DME Agencies: Other (Comment)(has rw, bsc and lift chair) Transportation available at discharge: Wife Resource referrals recommended: Support group (specify)  Discharge Planning Living Arrangements: Spouse/significant other Support Systems: Spouse/significant other, Children, Friends/neighbors, Church/faith community Type of Residence: Private residence Insurance Resources: Multimedia programmer (specify)(UHC-Medicare) Financial Resources: Social Security Financial Screen Referred: No Living Expenses: Own Money Management: Patient, Spouse Does the patient have any problems obtaining your medications?: No Home Management: Wife Patient/Family Preliminary Plans: Return home with wife who can provide assist. They have a daughter who is involved and supportive but busy with her three children and full time job. Aware team conference and team evaluating today and setting goals. Wife plans to be here today to observe him in therapies and plans to be here to provide support to pt. Social Work Anticipated Follow Up Needs: HH/OP, Support Group  Clinical Impression Pleasant exhausted gentleman who is willing to work hard in therapies but still recovering from his major surgery. His wife and daughter are involved and supportive and will assist him  at home. Wife is the main caregiver. Will await therapy evaluations and work on a safe plan for pt.  Elease Hashimoto 01/29/2018, 9:52 AM

## 2018-01-29 NOTE — Care Management Note (Signed)
Case Management Note Marvetta Gibbons RN,BSN Unit [facility] 1-22 Case Manager  [phone]  Patient Details  Name: [name] Young. MRN: [id number] Date of Birth: 17-Feb-1941  Subjective/Objective:  Pt admitted s/p Urgent CABGx3/coronary endarterectomy Postop acute renal failure, afib, poss LLL pneumonia, on IV milrinone                  Action/Plan: PTA pt lived at home with spouse- per PT eval- recommendation for CIR- will need CIR consult placed and OT eval to be considered for IP admit- MD please order if agreeable- CM to follow for transition of care needs  Expected Discharge Date:                  Expected Discharge Plan:  IP Rehab Facility  In-House Referral:  Clinical Social Work  Discharge planning Services  CM Consult  Post Acute Care Choice:    Choice offered to:     DME Arranged:    DME Agency:     HH Arranged:    [place] Agency:     Status of Service:  Completed, signed off  If discussed at [employer], dates discussed:    Discharge Disposition: IP rehab   Additional Comments:  01/28/18- 1430- Marvetta Gibbons RN, CM- notified by Pamala Hurry with CIR that pt will be admitted to IP rehab later today- bed available, insurance auth received- pt stable per MD for d/c to IP rehab.   Dawayne Patricia, RN 01/29/2018, 9:00 AM

## 2018-01-29 NOTE — Progress Notes (Signed)
[facility] PHYSICAL MEDICINE & REHABILITATION     PROGRESS NOTE  Subjective/Complaints:  Pt seen lying in bed this AM.  Wife at bedside.  Pt states he slept well after receiving sleep aid.  He has questions about his edema.  ROS: Denies CP, SOB, N/V/D.  Objective: Vital Signs: Blood pressure 127/69, pulse 77, temperature 98.4 F (36.9 C), temperature source Oral, resp. rate 16, weight 134.5 kg (296 lb 8.3 oz), SpO2 96 %. Dg Chest 2 View  Result Date: 01/28/2018 CLINICAL DATA:  Recent coronary artery bypass grafting with chest pain EXAM: CHEST - 2 VIEW COMPARISON:  Jan 26, 2018 FINDINGS: Three pacemaker wires are attached to the right heart. There is a small left pleural effusion with patchy consolidation in the left lower lobe. There is also a focal area of consolidation in the posterior segment of the left upper lobe. There is mild right base atelectasis. Lungs elsewhere clear. There is stable cardiomegaly with pulmonary vascularity normal. No adenopathy. There is aortic atherosclerosis. There is degenerative change in the thoracic spine. No pneumothorax. IMPRESSION: No pneumothorax. Patchy airspace consolidation posterior segment left upper lobe and left base regions with small left pleural effusion. Mild right base atelectasis. Lungs appear similar to recent prior study. Stable cardiomegaly. There is aortic atherosclerosis. Aortic Atherosclerosis (ICD10-I70.0). Electronically Signed   By: Lowella Grip III M.D.   On: 01/28/2018 07:28   Recent Labs    01/28/18 1917 01/29/18 0627  WBC 10.9* 10.9*  HGB 9.4* 9.1*  HCT 30.8* 29.3*  PLT 411* 419*   Recent Labs    01/28/18 0434 01/28/18 1917 01/29/18 0627  NA 139  --  141  K 4.2  --  4.6  CL 102  --  104  GLUCOSE 111*  --  99  BUN 44*  --  40*  CREATININE 1.62* 1.69* 1.64*  CALCIUM 8.9  --  8.9   CBG (last 3)  Recent Labs    01/28/18 1701 01/28/18 2153 01/29/18 0636  GLUCAP 152* 93 92    Wt Readings from Last 3  Encounters:  01/29/18 134.5 kg (296 lb 8.3 oz)  01/28/18 132.8 kg (292 lb 12.3 oz)  01/16/18 127.7 kg (281 lb 8 oz)    Physical Exam:  BP 127/69 (BP Location: Left Arm)   Pulse 77   Temp 98.4 F (36.9 C) (Oral)   Resp 16   Wt 134.5 kg (296 lb 8.3 oz)   SpO2 96%   BMI 42.55 kg/m  Constitutional: He appears well-developed. Morbidly obese  HENT: Normocephalic and atraumatic.  Eyes: EOM are normal. No discharge.  Cardiovascular: Normal rate and regular rhythm. No JVD. Respiratory: Effort normal and breath sounds normal.  GI: Bowel sounds are normal. Mild distention Musculoskeletal: He exhibits no tenderness. Anasarca  Neurological: He is alert and oriented.  Motor: Bilateral upper extremities 4/5 proximal distal Bilateral lower extremities: Hip flexion 3-/5, knee extension 3+/5, ankle dorsiflexion 4/5  Skin: Midline chest incision clean/dry/intact Psychiatric: He has a normal mood and affect. His behavior is normal.   Assessment/Plan: 1. Functional deficits secondary to debility which require 3+ hours per day of interdisciplinary therapy in a comprehensive inpatient rehab setting. Physiatrist is providing close team supervision and 24 hour management of active medical problems listed below. Physiatrist and rehab team continue to assess barriers to discharge/monitor patient progress toward functional and medical goals.  Function:  Bathing Bathing position      Bathing parts      Bathing assist  Upper Body Dressing/Undressing Upper body dressing                    Upper body assist        Lower Body Dressing/Undressing Lower body dressing                                  Lower body assist        Toileting Toileting          Toileting assist     Transfers Chair/bed transfer   Chair/bed transfer method: Stand pivot Chair/bed transfer assist level: Touching or steadying assistance (Pt > 75%) Chair/bed transfer assistive device:  Other(sternal precautions)     Locomotion Ambulation           Wheelchair          Cognition Comprehension    Expression    Social Interaction    Problem Solving    Memory      Medical Problem List and Plan: 1.  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  Sternal precautions  Begin CIR 2.  DVT Prophylaxis/Anticoagulation: Lovenox 30 mg daily.  Monitor for any bleeding episodes 3. Pain Management: Ultram as needed 4. Mood: Klonopin 1 mg nightly  Will require intermittent reassurance 5. Neuropsych: This patient is capable of making decisions on his own behalf. 6. Skin/Wound Care: Routine skin checks 7. Fluids/Electrolytes/Nutrition: Routine in and outs 8.  Acute blood loss anemia.     Hb 9.1 on 5/21   Cont to monitor 9.  Diabetes mellitus with peripheral neuropathy.  Hemoglobin A1c 6.8.  Levemir 14 units twice daily.  Check blood sugars before meals and at bedtime.  Diabetic teaching   Monitor with increased mobility 10.  Hypertension.  Lopressor 12.5 mg twice daily, amiodarone 200 mg twice daily and Lasix 40 mg daily   Monitor with increased mobility, labile at present 11.  CKD stage III.  Follow-up renal services as needed.     Cr. 1.64 on 5/21   Cont to monitor 12.  Morbid Obesity.  BMI 42.42.  Dietary follow-up 13.  Constipation.  Laxative assistance 14.  Hyperlipidemia.  Zocor 15. Hypoalbuminemia  Supplement initiated on 5/21  LOS (Days) 1 A FACE TO FACE EVALUATION WAS PERFORMED  Demontrez Rindfleisch Lorie Phenix 01/29/2018 8:16 AM

## 2018-01-30 ENCOUNTER — Inpatient Hospital Stay (HOSPITAL_COMMUNITY): Payer: Medicare Other | Admitting: Physical Therapy

## 2018-01-30 ENCOUNTER — Inpatient Hospital Stay (HOSPITAL_COMMUNITY): Payer: Medicare Other

## 2018-01-30 ENCOUNTER — Inpatient Hospital Stay (HOSPITAL_COMMUNITY): Payer: Medicare Other | Admitting: Occupational Therapy

## 2018-01-30 DIAGNOSIS — G479 Sleep disorder, unspecified: Secondary | ICD-10-CM

## 2018-01-30 LAB — GLUCOSE, CAPILLARY
GLUCOSE-CAPILLARY: 83 mg/dL (ref 65–99)
GLUCOSE-CAPILLARY: 103 mg/dL — AB (ref 65–99)
Glucose-Capillary: 84 mg/dL (ref 65–99)
Glucose-Capillary: 117 mg/dL — ABNORMAL HIGH (ref 65–99)

## 2018-01-30 MED ORDER — NON FORMULARY: 1.5000 mg | Freq: Every day | Status: DC

## 2018-01-30 MED ORDER — MELATONIN 3 MG PO TABS
1.5000 mg | ORAL_TABLET | Freq: Every day | ORAL | Status: DC
  Administered 2018-01-30 – 2018-02-01 (×3): 1.5 mg via ORAL
  Filled 2018-01-30 (×3): qty 0.5

## 2018-01-30 NOTE — Progress Notes (Signed)
Occupational Therapy Session Note  Patient Details  Name: [name] Young. MRN: [id number] Date of Birth: July 12, 1941  Today's Date: 01/30/2018 OT Individual Time: 1300-1345 OT Individual Time Calculation (min): 45 min  OT Missed Time: 15 min d/t pt fatigue   Short Term Goals: Week 1:  OT Short Term Goal 1 (Week 1): STGs equal to LTGs set at supervision to modified independence.  Skilled Therapeutic Interventions/Progress Updates:    Pt received on toilet. Pt completed sit to stand transfer from toilet with (S). Pt ambulated to chair with CGA and vc for environment awareness/overall safety awareness. Pt sat in recliner and required extended rest break d/t extreme fatigue/SOB. Skilled monitoring of vitals with HR at 122 following 5 ft of functional mobility. Pt completed 2x sit to stand transfer with extensive edu/cueing for completion without use of UE for sternal precautions, with pt completing with min A overall. Pt completed 50 ft of functional mobility with RW before requiring seated rest break and requesting to return to room. Pt completed standing level urinal/clothing management with (S) level overall. Pt returned to seated in recliner with feet up, chair alarm set and all needs met.   Therapy Documentation Precautions:  Precautions Precautions: Fall, Sternal Precaution Booklet Issued: No Precaution Comments: Requires min cues to adhere to sternal precautions Restrictions Weight Bearing Restrictions: Yes(strernal precautions) Other Position/Activity Restrictions: sternal precautions Therapy Vitals Temp: 98.6 F (37 C) Pulse Rate: 70 Resp: 20 BP: (!) 152/63 Patient Position (if appropriate): Sitting Oxygen Therapy SpO2: 95 % O2 Device: Room Air Pain:  No pain reported  See Function Navigator for Current Functional Status.   Therapy/Group: Individual Therapy  Curtis Sites 01/30/2018, 2:59 PM

## 2018-01-30 NOTE — Progress Notes (Signed)
Patient information reviewed and entered into eRehab system by Megham Dwyer, RN, CRRN, PPS Coordinator.  Information including medical coding and functional independence measure will be reviewed and updated through discharge.     Per nursing patient was given "Data Collection Information Summary for Patients in Inpatient Rehabilitation Facilities with attached "Privacy Act Statement-Health Care Records" upon admission.  

## 2018-01-30 NOTE — Progress Notes (Signed)
Social Work Patient ID: [name] Young., male   DOB: Feb 15, 1941, 77 y.o.   MRN: [id number]  Spoke with wife to inform team conference goals supervision and target discharge date 5/25. She will be glad to get him home and pt will be glad to be there. Discussed needs they have a elevated commode but not a walker at home. Will see in am to discuss needs.
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
## 2018-01-30 NOTE — Progress Notes (Signed)
Physical Therapy Session Note  Patient Details  Name: [name] Young. MRN: [id number] Date of Birth: Apr 16, 1941  Today's Date: 01/30/2018 PT Individual Time: 0800-0915 PT Individual Time Calculation (min): 75 min   Short Term Goals: Week 1:  PT Short Term Goal 1 (Week 1): =LTG due to estimated LOS  Skilled Therapeutic Interventions/Progress Updates: Pt received seated in recliner, c/o pain in buttocks as below and agreeable to treatment. TEDs donned totalA. Gait in/out of bathroom with RW and S; S for standing balance while urinating and washing hands. Gait to gym x150' with RW and min guard/close S; slow speed and shortened step length. Performed TUG with RW 1 min 3 sec, performed without RW 30 sec. Ascent/descent four 6" steps with 2 handrails with min guard; pt only has one rail at home, discussed plan to begin with two rails, decrease to 1 as pt improves strength and confidence. Ambulatory transfer to mat table x15' with no AD and min guard. 5 time sit to stand performed 1 min 5 sec. Stand pivot transfer to w/c for prolonged rest break with back support d/t fatigue and report of being "give out". BP assessed 131/73 HR 81 bpm. Educated pt on blunted HR response to exercise d/t use of beta blockers and importance of measuring rate of perceived exertion to determine intensity of exercise. Standing gastroc stretch on wedge x2 min, x15 reps heel raises. Gait to return to room with RW and min guard x150'. Remained seated in recliner at end of session, chair alarm intact, wife present and all needs in reach.      Therapy Documentation Precautions:  Precautions Precautions: Fall, Sternal Precaution Booklet Issued: No Precaution Comments: Requires min cues to adhere to sternal precautions Restrictions Weight Bearing Restrictions: Yes(strernal precautions) Other Position/Activity Restrictions: sternal precautions   See Function Navigator for Current Functional Status.   Therapy/Group:  Individual Therapy  [name] 01/30/2018, 9:41 AM

## 2018-01-30 NOTE — Plan of Care (Signed)
  Problem: RH BOWEL ELIMINATION Goal: RH STG MANAGE BOWEL WITH ASSISTANCE Description STG Manage Bowel with Min  Assistance.  Outcome: Progressing Goal: RH STG MANAGE BOWEL W/MEDICATION W/ASSISTANCE Description STG Manage Bowel with Medication with min Assistance.  Outcome: Progressing Goal: RH STG MANAGE BOWEL W/EQUIPMENT W/ASSISTANCE Description STG Manage Bowel With Equipment With mod  Assistance  Outcome: Progressing Goal: RH OTHER STG BOWEL ELIMINATION GOALS W/ASSIST Description Other STG Bowel Elimination Goals With [place].  Outcome: Progressing   Problem: RH BLADDER ELIMINATION Goal: RH STG MANAGE BLADDER WITH ASSISTANCE Description STG Manage Bladder With Min  Assistance  Outcome: Progressing Goal: RH STG MANAGE BLADDER WITH EQUIPMENT WITH ASSISTANCE Description STG Manage Bladder With Equipment With  Mod Assistance  Outcome: Progressing Goal: RH OTHER STG BLADDER ELIMINATION GOALS W/ASSIST Description Other STG Bladder Elimination Goals With Mod  Assistance  Outcome: Progressing   Problem: RH SKIN INTEGRITY Goal: RH STG SKIN FREE OF INFECTION/BREAKDOWN Outcome: Progressing Goal: RH STG MAINTAIN SKIN INTEGRITY WITH ASSISTANCE Description STG Maintain Skin Integrity With Mod  Assistance.  Outcome: Progressing Goal: RH STG ABLE TO PERFORM INCISION/WOUND CARE W/ASSISTANCE Description STG Able To Perform Incision/Wound Care With  [employer].  Outcome: Progressing Goal: RH OTHER STG SKIN INTEGRITY GOALS W/ASSIST Description Other STG Skin Integrity Goals With [place].  Outcome: Progressing   Problem: RH SAFETY Goal: RH STG ADHERE TO SAFETY PRECAUTIONS W/ASSISTANCE/DEVICE Description STG Adhere to Safety Precautions With Assistance/Device Mod assist   Outcome: Progressing Goal: RH STG DECREASED RISK OF FALL WITH ASSISTANCE Description STG Decreased Risk of Fall With mod  Assistance.  Outcome: Progressing Goal: RH STG DEMO UNDERSTANDING  HOME SAFETY PRECAUTIONS Outcome: Progressing Goal: RH OTHER STG SAFETY GOALS W/ASSIST Description Other STG Safety Goals With Mod Assistance.  Outcome: Progressing   Problem: RH PAIN MANAGEMENT Goal: Pain level will decrease with appropriate interventions Description Patient will have pain that is <= 3/10  Outcome: Progressing

## 2018-01-30 NOTE — Progress Notes (Signed)
[name] PHYSICAL MEDICINE & REHABILITATION     PROGRESS NOTE  Subjective/Complaints:  Patient seen sitting up in his chair this morning. He states he did not sl He requests sleeping.He states he had a good day in therapy yesterday.  ROS: Denies CP, SOB, N/V/D.  Objective: Vital Signs: Blood pressure (!) 141/58, pulse (!) 42, temperature 98 F (36.7 C), temperature source Oral, resp. rate 18, weight 132.5 kg (292 lb 1.6 oz), SpO2 96 %. No results found. Recent Labs    01/28/18 1917 01/29/18 0627  WBC 10.9* 10.9*  HGB 9.4* 9.1*  HCT 30.8* 29.3*  PLT 411* 419*   Recent Labs    01/28/18 0434 01/28/18 1917 01/29/18 0627  NA 139  --  141  K 4.2  --  4.6  CL 102  --  104  GLUCOSE 111*  --  99  BUN 44*  --  40*  CREATININE 1.62* 1.69* 1.64*  CALCIUM 8.9  --  8.9   CBG (last 3)  Recent Labs    01/29/18 1644 01/29/18 2127 01/30/18 0649  GLUCAP 101* 131* 83    Wt Readings from Last 3 Encounters:  01/30/18 132.5 kg (292 lb 1.6 oz)  01/28/18 132.8 kg (292 lb 12.3 oz)  01/16/18 127.7 kg (281 lb 8 oz)    Physical Exam:  BP (!) 141/58 (BP Location: Left Arm)   Pulse (!) 42   Temp 98 F (36.7 C) (Oral)   Resp 18   Wt 132.5 kg (292 lb 1.6 oz)   SpO2 96%   BMI 41.91 kg/m  Constitutional: He appears well-developed. Morbidly obese  HENT: Normocephalic and atraumatic.  Eyes: EOM are normal. No discharge.  Cardiovascular: RRR. No JVD. Respiratory: Effort normal and breath sounds normal.  GI: Bowel sounds are normal. Mild distention Musculoskeletal: He exhibits no tenderness. Anasarca  Neurological: He is alert and oriented.  Motor: Bilateral upper extremities 4/5 proximal distal Bilateral lower extremities: Hip flexion 4+/5, knee extension 4+/5, ankle dorsiflexion 4+/5  Skin: Midline chest incision clean/dry/intact Psychiatric: He has a normal mood and affect. His behavior is normal.   Assessment/Plan: 1. Functional deficits secondary to debility which require 3+  hours per day of interdisciplinary therapy in a comprehensive inpatient rehab setting. Physiatrist is providing close team supervision and 24 hour management of active medical problems listed below. Physiatrist and rehab team continue to assess barriers to discharge/monitor patient progress toward functional and medical goals.  Function:  Bathing Bathing position   Position: Wheelchair/chair at sink(bedside recliner at the sink)  Bathing parts Body parts bathed by patient: Right arm, Left arm, Chest, Abdomen, Front perineal area, Buttocks, Right upper leg, Left upper leg Body parts bathed by helper: Right lower leg, Left lower leg, Back  Bathing assist        Upper Body Dressing/Undressing Upper body dressing   What is the patient wearing?: Pull over shirt/dress     Pull over shirt/dress - Perfomed by patient: Thread/unthread right sleeve, Thread/unthread left sleeve, Put head through opening, Pull shirt over trunk          Upper body assist Assist Level: Set up      Lower Body Dressing/Undressing Lower body dressing   What is the patient wearing?: Underwear, Pants, Socks, [employer], Shoes   Underwear - Performed by helper: Thread/unthread right underwear leg, Thread/unthread left underwear leg, Pull underwear up/down   Pants- Performed by helper: Thread/unthread right pants leg, Thread/unthread left pants leg, Pull pants up/down  Shoes - Performed by helper: Don/doff right shoe, Don/doff left shoe, Fasten right, Fasten left       TED Hose - Performed by helper: Don/doff right TED hose, Don/doff left TED hose  Lower body assist        Toileting Toileting   Toileting steps completed by patient: Adjust clothing prior to toileting, Performs perineal hygiene, Adjust clothing after toileting   Toileting Assistive Devices: Grab bar or rail  Toileting assist Assist level: Touching or steadying assistance (Pt.75%)   Transfers Chair/bed transfer   Chair/bed  transfer method: Stand pivot Chair/bed transfer assist level: Touching or steadying assistance (Pt > 75%) Chair/bed transfer assistive device: Medical sales representative     Max distance: 80 Assist level: Touching or steadying assistance (Pt > 75%)   Wheelchair     Max wheelchair distance: 75 Assist Level: Touching or steadying assistance (Pt > 75%)(BLEs)  Cognition Comprehension Comprehension assist level: Follows complex conversation/direction with extra time/assistive device  Expression Expression assist level: Expresses complex ideas: With extra time/assistive device  Social Interaction Social Interaction assist level: Interacts appropriately with others with medication or extra time (anti-anxiety, antidepressant).  Problem Solving Problem solving assist level: Solves basic problems with no assist  Memory Memory assist level: More than reasonable amount of time    Medical Problem List and Plan: 1.  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  Sternal precautions  Continue CIR 2.  DVT Prophylaxis/Anticoagulation: Lovenox 30 mg daily.  Monitor for any bleeding episodes 3. Pain Management: Ultram as needed 4. Mood: Klonopin 1 mg nightly  Will likely require intermittent reassurance 5. Neuropsych: This patient is capable of making decisions on his own behalf. 6. Skin/Wound Care: Routine skin checks 7. Fluids/Electrolytes/Nutrition: Routine in and outs 8.  Acute blood loss anemia.     Hb 9.1 on 5/21   Cont to monitor 9.  Diabetes mellitus with peripheral neuropathy.  Hemoglobin A1c 6.8.  Levemir 14 units twice daily.  Check blood sugars before meals and at bedtime.  Diabetic teaching   SLightly labile, but overall controlled on 5/22 10.  Hypertension.  Lopressor 12.5 mg twice daily, amiodarone 200 mg twice daily and Lasix 40 mg daily   Slightly labile, but overall controlled on 5/22 11.  CKD stage III.  Follow-up renal services as needed.      Cr. 1.64 on 5/21   Cont to monitor 12.  Morbid Obesity.  BMI 42.42.  Dietary follow-up 13.  Constipation.  Laxative assistance 14.  Hyperlipidemia.  Zocor 15. Hypoalbuminemia  Supplement initiated on 5/21 16. Sleep disturbance  Melatonin started on 5/22  LOS (Days) 2 A FACE TO FACE EVALUATION WAS PERFORMED  [name] Young Lorie Phenix 01/30/2018 8:27 AM

## 2018-01-30 NOTE — Progress Notes (Signed)
Pt wife called nurse in to see pt regarding his feet having more edema than normal. Ted hose placed back on BLE and elevated BLE in recliner and chair. Wife concerned about area on R foot that is new. Small red area 0.5x0.5 cm. Notified PA Dan to assess.

## 2018-01-30 NOTE — Progress Notes (Signed)
Occupational Therapy Session Note  Patient Details  Name: [name] Young. MRN: [id number] Date of Birth: 1940/12/22  Today's Date: 01/30/2018 OT Individual Time: 1004-1100 OT Individual Time Calculation (min): 56 min    Short Term Goals: Week 1:  OT Short Term Goal 1 (Week 1): STGs equal to LTGs set at supervision to modified independence.  Skilled Therapeutic Interventions/Progress Updates:    Pt completed bathing and dressing sit to stand at the sink during session.  Min assist for all sit to stand transitions with min instructional cueing for hand placement on knees to avoid breaking his sternal precautions.  Min assist also for toilet transfer to the bathroom to stand and urinate.  Checked pt's spouse off to assist pt with toilet transfers as well.  Pt utilized reacher for doffing gripper socks but needed therapist assist to remove compression socks.  He also needed assist with washing bilateral feet but did discuss need for LH brush or sponge at home but did not supply him with one as it would not be sturdy enough for him.  Pt completed donning underpants and pants with use of the reacher with supervision and then pulled over his hips with min assist. Notified nursing about increased swelling in pt's legs, which both spouse and pt state has not been this bad before. Pt left in bedside recliner with LEs elevated on pillows to help decreased swelling.  Call button and phone in reach at end of session.     Therapy Documentation Precautions:  Precautions Precautions: Fall, Sternal Precaution Booklet Issued: No Precaution Comments: Requires min cues to adhere to sternal precautions Restrictions Weight Bearing Restrictions: Yes(strernal precautions) Other Position/Activity Restrictions: sternal precautions  Pain: Pain Assessment Pain Score: 0-No pain ADL: See Function Navigator for Current Functional Status.   Therapy/Group: Individual Therapy  Channing Yeager OTR/L 01/30/2018,  12:25 PM

## 2018-01-31 ENCOUNTER — Inpatient Hospital Stay (HOSPITAL_COMMUNITY): Payer: Medicare Other | Admitting: Occupational Therapy

## 2018-01-31 ENCOUNTER — Inpatient Hospital Stay (HOSPITAL_COMMUNITY): Payer: Medicare Other | Admitting: Physical Therapy

## 2018-01-31 LAB — GLUCOSE, CAPILLARY
GLUCOSE-CAPILLARY: 83 mg/dL (ref 65–99)
GLUCOSE-CAPILLARY: 108 mg/dL — AB (ref 65–99)
Glucose-Capillary: 115 mg/dL — ABNORMAL HIGH (ref 65–99)
Glucose-Capillary: 94 mg/dL (ref 65–99)

## 2018-01-31 NOTE — Progress Notes (Signed)
Occupational Therapy Session Note  Patient Details  Name: [name] Young. MRN: [id number] Date of Birth: 1941/08/19  Today's Date: 01/31/2018 OT Individual Time: 1120-1200 OT Individual Time Calculation (min): 40 min   Short Term Goals: Week 1:  OT Short Term Goal 1 (Week 1): STGs equal to LTGs set at supervision to modified independence.  Skilled Therapeutic Interventions/Progress Updates:    Pt greeted sitting on commode after successful BM. Pt able to complete toileting with supervision and increased time. Pt ambulated out of bathroom and washed hands at the sink with close supervision. Pt needed extended rest break, then ambulated to apartment bathroom with close supervision. Pt completed tub bench transfer with assist to lift BLEs into and out of tub and verbal cues for sternal precautions. Pt returned to room with min guard A 2/2 fatigue and no AD. Pt left seated in recliner with chair alarm on and needs met.   Therapy Documentation Precautions:  Precautions Precautions: Fall, Sternal Precaution Booklet Issued: No Precaution Comments: Requires min cues to adhere to sternal precautions Restrictions Weight Bearing Restrictions: Yes(Strenal Precautions ) Other Position/Activity Restrictions: sternal precautions Pain:   none/denies pain  See Function Navigator for Current Functional Status.   Therapy/Group: Individual Therapy  Valma Cava 01/31/2018, 12:15 PM

## 2018-01-31 NOTE — Progress Notes (Signed)
Social Work Patient ID: [name] Young., male   DOB: Jan 11, 1941, 77 y.o.   MRN: [id number]  Discussed equipment needs -wide rolling walker & tub bench and follow up. Wife aware tub bench is private pay. No preference regarding follow up. She has been educated in therapies and is ready to go home Sat.

## 2018-01-31 NOTE — Progress Notes (Signed)
[facility] PHYSICAL MEDICINE & REHABILITATION     PROGRESS NOTE  Subjective/Complaints:  Patient seen sitting up in his chair this morning. He states he slept better overnight. He states he wants better food.  ROS: denies CP, SOB, N/V/D.  Objective: Vital Signs: Blood pressure (!) 119/58, pulse 72, temperature 98.4 F (36.9 C), temperature source Oral, resp. rate 18, weight 131.2 kg (289 lb 3 oz), SpO2 96 %. No results found. Recent Labs    01/28/18 1917 01/29/18 0627  WBC 10.9* 10.9*  HGB 9.4* 9.1*  HCT 30.8* 29.3*  PLT 411* 419*   Recent Labs    01/28/18 1917 01/29/18 0627  NA  --  141  K  --  4.6  CL  --  104  GLUCOSE  --  99  BUN  --  40*  CREATININE 1.69* 1.64*  CALCIUM  --  8.9   CBG (last 3)  Recent Labs    01/30/18 1621 01/30/18 2103 01/31/18 0637  GLUCAP 84 117* 83    Wt Readings from Last 3 Encounters:  01/31/18 131.2 kg (289 lb 3 oz)  01/28/18 132.8 kg (292 lb 12.3 oz)  01/16/18 127.7 kg (281 lb 8 oz)    Physical Exam:  BP (!) 119/58   Pulse 72   Temp 98.4 F (36.9 C) (Oral)   Resp 18   Wt 131.2 kg (289 lb 3 oz)   SpO2 96%   BMI 41.49 kg/m  Constitutional: He appears well-developed. Morbidly obese  HENT: Normocephalic and atraumatic.  Eyes: EOM are normal. No discharge.  Cardiovascular: RRR. No JVD. Respiratory: Effort normal and breath sounds normal.  GI: Bowel sounds are normal. Distention Musculoskeletal: He exhibits no tenderness. Anasarca, stable Neurological: He is alert and oriented.  Motor: Bilateral upper extremities 4+/5 proximal distal Bilateral lower extremities: Hip flexion 4+/5, knee extension 4+/5, ankle dorsiflexion 4+/5  Skin: Midline chest incision clean/dry/intact Psychiatric: He has a normal mood and affect. His behavior is normal.   Assessment/Plan: 1. Functional deficits secondary to debility which require 3+ hours per day of interdisciplinary therapy in a comprehensive inpatient rehab setting. Physiatrist is  providing close team supervision and 24 hour management of active medical problems listed below. Physiatrist and rehab team continue to assess barriers to discharge/monitor patient progress toward functional and medical goals.  Function:  Bathing Bathing position   Position: Wheelchair/chair at sink  Bathing parts Body parts bathed by patient: Right arm, Left arm, Chest, Abdomen, Front perineal area, Buttocks, Right upper leg, Left upper leg Body parts bathed by helper: Back, Left lower leg, Right lower leg  Bathing assist        Upper Body Dressing/Undressing Upper body dressing   What is the patient wearing?: Pull over shirt/dress     Pull over shirt/dress - Perfomed by patient: Thread/unthread right sleeve, Thread/unthread left sleeve, Put head through opening, Pull shirt over trunk          Upper body assist Assist Level: Supervision or verbal cues      Lower Body Dressing/Undressing Lower body dressing   What is the patient wearing?: Underwear, Pants, [employer], Shoes, Non-skid Museum/gallery curator - Performed by patient: Thread/unthread right underwear leg, Thread/unthread left underwear leg, Pull underwear up/down Underwear - Performed by helper: Thread/unthread right underwear leg, Thread/unthread left underwear leg, Pull underwear up/down Pants- Performed by patient: Thread/unthread right pants leg, Thread/unthread left pants leg, Pull pants up/down Pants- Performed by helper: Thread/unthread right pants leg, Thread/unthread left pants leg, Pull  pants up/down Non-skid slipper socks- Performed by patient: Don/doff right sock, Don/doff left sock(doff with reacher)         Shoes - Performed by helper: Don/doff right shoe, Don/doff left shoe, Fasten right, Fasten left       TED Hose - Performed by helper: Don/doff right TED hose, Don/doff left TED hose  Lower body assist Assist for lower body dressing: Touching or steadying assistance (Pt > 75%)       Toileting Toileting   Toileting steps completed by patient: Adjust clothing prior to toileting   Toileting Assistive Devices: Grab bar or rail  Toileting assist Assist level: Touching or steadying assistance (Pt.75%)   Transfers Chair/bed transfer   Chair/bed transfer method: Stand pivot Chair/bed transfer assist level: Touching or steadying assistance (Pt > 75%) Chair/bed transfer assistive device: Medical sales representative     Max distance: 150 Assist level: Touching or steadying assistance (Pt > 75%)   Wheelchair     Max wheelchair distance: 75 Assist Level: Touching or steadying assistance (Pt > 75%)(BLEs)  Cognition Comprehension Comprehension assist level: Follows complex conversation/direction with extra time/assistive device  Expression Expression assist level: Expresses complex ideas: With extra time/assistive device  Social Interaction Social Interaction assist level: Interacts appropriately with others with medication or extra time (anti-anxiety, antidepressant).  Problem Solving Problem solving assist level: Solves basic problems with no assist  Memory Memory assist level: More than reasonable amount of time    Medical Problem List and Plan: 1.  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  Sternal precautions  Continue CIR 2.  DVT Prophylaxis/Anticoagulation: Lovenox 30 mg daily.  Monitor for any bleeding episodes 3. Pain Management: Ultram as needed 4. Mood: Klonopin 1 mg nightly  Will likely require intermittent reassurance 5. Neuropsych: This patient is capable of making decisions on his own behalf. 6. Skin/Wound Care: Routine skin checks 7. Fluids/Electrolytes/Nutrition: Routine in and outs 8.  Acute blood loss anemia.     Hb 9.1 on 5/21   Labs ordered for tomorrow   Cont to monitor 9.  Diabetes mellitus with peripheral neuropathy.  Hemoglobin A1c 6.8.  Levemir 14 units twice daily.  Check blood sugars before meals and  at bedtime.  Diabetic teaching   Overall controlled on 5/23 10.  Hypertension.  Lopressor 12.5 mg twice daily, amiodarone 200 mg twice daily and Lasix 40 mg daily   Labile on 5/23 11.  CKD stage III.  Follow-up renal services as needed.     Cr. 1.64 on 5/21   Labs ordered for tomorrow   Cont to monitor 12.  Morbid Obesity.  BMI 42.42.  Dietary follow-up 13.  Constipation.  Laxative assistance 14.  Hyperlipidemia.  Zocor 15. Hypoalbuminemia  Supplement initiated on 5/21 16. Sleep disturbance  Melatonin started on 5/22 with benefit  LOS (Days) 3 A FACE TO FACE EVALUATION WAS PERFORMED  Ankit Lorie Phenix 01/31/2018 8:58 AM

## 2018-01-31 NOTE — Progress Notes (Signed)
Occupational Therapy Session Note  Patient Details  Name: [name] Young. MRN: [id number] Date of Birth: 07-Apr-1941  Today's Date: 01/31/2018 OT Individual Time: 9629-5284 OT Individual Time Calculation (min): 48 min    Short Term Goals: Week 1:  OT Short Term Goal 1 (Week 1): STGs equal to LTGs set at supervision to modified independence.  Skilled Therapeutic Interventions/Progress Updates:    Pt completed functional mobility to the orthopedic gym with supervision using the RW for support.  Once in the therapy gym, pt completed multiple sit to stand transitions and squat transitions without use of the UEs in order to maintain sternal precautions.  He needed min instructional cueing for greater forward weight shift with his trunk as well as occasional min assist for sit to stand.  Started with mat elevated and pt demonstrating ability to complete sit to stand with supervision.  As mat was lowered, pt needing min assist to complete this.  Finished session with ambulation back to the room with pt being left up in the recliner with call button and phone in reach and visitors present in the room.    Therapy Documentation Precautions:  Precautions Precautions: Fall, Sternal Precaution Booklet Issued: No Precaution Comments: Requires min cues to adhere to sternal precautions Restrictions Weight Bearing Restrictions: Yes Other Position/Activity Restrictions: sternal precautions  Pain: No report of pain during session  See Function Navigator for Current Functional Status.   Therapy/Group: Individual Therapy  Reighn Kaplan OTR/L 01/31/2018, 4:11 PM

## 2018-01-31 NOTE — Progress Notes (Signed)
Occupational Therapy Session Note  Patient Details  Name: [name] Young. MRN: [id number] Date of Birth: 1940-11-20  Today's Date: 01/31/2018 OT Individual Time: 1120-1200 OT Individual Time Calculation (min): 40 min    Short Term Goals: Week 1:  OT Short Term Goal 1 (Week 1): STGs equal to LTGs set at supervision to modified independence.  Skilled Therapeutic Interventions/Progress Updates:    Pt completed bathing and dressing during session.  Min assist for sit to stand from the elevated bedside recliner.  He then ambulated to the shower bench with supervision using the RW.  AE utilized for removal of gripper socks and he was able to remove his underpants, pants, and pullover shirt with supervision.  Bathing sit to stand with min guard assist.  Pt with one LOB posteriorly when attempting to stand and rinse his hair.  Educated pt on the need to sit for safety when completing UB selfcare tasks.  Still with decreased ability to reach his LEs for washing or for dressing.  Min instructional cueing for use of reacher to thread items over feet.  He also needed mod assist for donning shoes with laces already tied, using reacher and shoe funnel.    Therapy Documentation Precautions:  Precautions Precautions: Fall, Sternal Precaution Booklet Issued: No Precaution Comments: Requires min cues to adhere to sternal precautions Restrictions Weight Bearing Restrictions: Yes Other Position/Activity Restrictions: sternal precautions  Pain: Pain Assessment Pain Scale: Faces Pain Score: 0-No pain ADL: See Function Navigator for Current Functional Status.   Therapy/Group: Individual Therapy  Camry Theiss OTR/L 01/31/2018, 12:21 PM

## 2018-01-31 NOTE — Discharge Instructions (Signed)
Inpatient Rehab Discharge Instructions  [name] Young. Discharge date and time: No discharge date for patient encounter.   Activities/Precautions/ Functional Status: Activity: Sternal precautions Diet: diabetic diet Wound Care: keep wound clean and dry Functional status:  ___ No restrictions     ___ Walk up steps independently ___ 24/7 supervision/assistance   ___ Walk up steps with assistance ___ Intermittent supervision/assistance  ___ Bathe/dress independently ___ Walk with walker     _x__ Bathe/dress with assistance ___ Walk Independently    ___ Shower independently ___ Walk with assistance    ___ Shower with assistance ___ No alcohol     ___ Return to work/school ________  Special Instructions:    COMMUNITY REFERRALS UPON DISCHARGE:    Home Health:   PT & RN   [facility]   Date of last service:02/02/2018  Medical Equipment/Items Ordered:[facility] [place]   [phone]    My questions have been answered and I understand these instructions. I will adhere to these goals and the provided educational materials after my discharge from the hospital.  Patient/Caregiver Signature _______________________________ Date __________  Clinician Signature _______________________________________ Date __________  Please bring this form and your medication list with you to all your follow-up doctor's appointments.

## 2018-01-31 NOTE — Progress Notes (Signed)
Physical Therapy Session Note  Patient Details  Name: [name] Young. MRN: [id number] Date of Birth: 31-May-1941  Today's Date: 01/31/2018 PT Individual Time: 0900-1000 PT Individual Time Calculation (min): 60 min   Short Term Goals: Week 1:  PT Short Term Goal 1 (Week 1): =LTG due to estimated LOS  Skilled Therapeutic Interventions/Progress Updates: Pt received seated in recliner immediately following OT session; c/o pain as below and reports very fatigued from shower with OT. Gait x150' with RW and S, slow speed, to ADL apartment. Once in apartment pt reports urgency to void; gait in/out of bathroom with RW and S. Pt manages clothing and performs hygiene with S; unable to void, (+) flatulence. Pt significantly short of breath following toileting. Performed bed mobility on flat bed with S and increased time. Decreased supine tolerance, reports sleeping on several pillows at home. Performed couch transfer S onto couch, modA off d/t low seat height. Requires prolonged rest break on couch d/t fatigue. Returned to room totalA in w/c; transfer to recliner with RW and S. Educated pt and wife in energy conservation and considerations, I.e. Showering the night before if he has something to do the next day, scheduling/planning rest breaks between activities, etc. Remained in recliner with quick release belt intact, all needs in reach.      Therapy Documentation Precautions:  Precautions Precautions: Fall, Sternal Precaution Booklet Issued: No Precaution Comments: Requires min cues to adhere to sternal precautions Restrictions Weight Bearing Restrictions: Yes(Strenal Precautions ) Other Position/Activity Restrictions: sternal precautions Pain: Pain Assessment Pain Scale: 0-10 Pain Score: 8  Pain Type: Acute pain Pain Location: Buttocks Pain Descriptors / Indicators: Aching;Discomfort Pain Frequency: Intermittent Pain Onset: On-going Pain Intervention(s): Repositioned;Medication (See  eMAR)   See Function Navigator for Current Functional Status.   Therapy/Group: Individual Therapy  [name] 01/31/2018, 9:56 AM

## 2018-01-31 NOTE — Plan of Care (Signed)
  Problem: RH BOWEL ELIMINATION Goal: RH STG MANAGE BOWEL WITH ASSISTANCE Description STG Manage Bowel with Min  Assistance.  Outcome: Progressing Goal: RH STG MANAGE BOWEL W/MEDICATION W/ASSISTANCE Description STG Manage Bowel with Medication with min Assistance.  Outcome: Progressing Goal: RH STG MANAGE BOWEL W/EQUIPMENT W/ASSISTANCE Description STG Manage Bowel With Equipment With mod  Assistance  Outcome: Progressing Goal: RH OTHER STG BOWEL ELIMINATION GOALS W/ASSIST Description Other STG Bowel Elimination Goals With [place].  Outcome: Progressing   Problem: RH BLADDER ELIMINATION Goal: RH STG MANAGE BLADDER WITH ASSISTANCE Description STG Manage Bladder With Min  Assistance  Outcome: Progressing Goal: RH STG MANAGE BLADDER WITH EQUIPMENT WITH ASSISTANCE Description STG Manage Bladder With Equipment With  Mod Assistance  Outcome: Progressing Goal: RH OTHER STG BLADDER ELIMINATION GOALS W/ASSIST Description Other STG Bladder Elimination Goals With Mod  Assistance  Outcome: Progressing   Problem: RH SKIN INTEGRITY Goal: RH STG SKIN FREE OF INFECTION/BREAKDOWN Outcome: Progressing Goal: RH STG MAINTAIN SKIN INTEGRITY WITH ASSISTANCE Description STG Maintain Skin Integrity With Mod  Assistance.  Outcome: Progressing Goal: RH STG ABLE TO PERFORM INCISION/WOUND CARE W/ASSISTANCE Description STG Able To Perform Incision/Wound Care With  [employer].  Outcome: Progressing Goal: RH OTHER STG SKIN INTEGRITY GOALS W/ASSIST Description Other STG Skin Integrity Goals With [place].  Outcome: Progressing   Problem: RH SAFETY Goal: RH STG ADHERE TO SAFETY PRECAUTIONS W/ASSISTANCE/DEVICE Description STG Adhere to Safety Precautions With Assistance/Device Mod assist   Outcome: Progressing Goal: RH STG DECREASED RISK OF FALL WITH ASSISTANCE Description STG Decreased Risk of Fall With mod  Assistance.  Outcome: Progressing Goal: RH STG DEMO UNDERSTANDING  HOME SAFETY PRECAUTIONS Outcome: Progressing Goal: RH OTHER STG SAFETY GOALS W/ASSIST Description Other STG Safety Goals With Mod Assistance.  Outcome: Progressing   Problem: RH PAIN MANAGEMENT Goal: Pain level will decrease with appropriate interventions Description Patient will have pain that is <= 3/10  Outcome: Progressing

## 2018-02-01 ENCOUNTER — Inpatient Hospital Stay (HOSPITAL_COMMUNITY): Payer: Medicare Other | Admitting: Physical Therapy

## 2018-02-01 ENCOUNTER — Inpatient Hospital Stay (HOSPITAL_COMMUNITY): Payer: Medicare Other | Admitting: Occupational Therapy

## 2018-02-01 ENCOUNTER — Inpatient Hospital Stay (HOSPITAL_COMMUNITY): Payer: Medicare Other

## 2018-02-01 LAB — CBC WITH DIFFERENTIAL/PLATELET
Abs Immature Granulocytes: 0.1 10*3/uL (ref 0.0–0.1)
Basophils Absolute: 0 10*3/uL (ref 0.0–0.1)
Basophils Relative: 0 %
EOS ABS: 0.2 10*3/uL (ref 0.0–0.7)
EOS PCT: 3 %
HEMATOCRIT: 29.5 % — AB (ref 39.0–52.0)
Hemoglobin: 9.1 g/dL — ABNORMAL LOW (ref 13.0–17.0)
Immature Granulocytes: 1 %
LYMPHS ABS: 1.4 10*3/uL (ref 0.7–4.0)
Lymphocytes Relative: 16 %
MCH: 29 pg (ref 26.0–34.0)
MCHC: 30.8 g/dL (ref 30.0–36.0)
MCV: 93.9 fL (ref 78.0–100.0)
MONOS PCT: 7 %
Monocytes Absolute: 0.6 10*3/uL (ref 0.1–1.0)
Neutro Abs: 6.6 10*3/uL (ref 1.7–7.7)
Neutrophils Relative %: 73 %
PLATELETS: 440 10*3/uL — AB (ref 150–400)
RBC: 3.14 MIL/uL — ABNORMAL LOW (ref 4.22–5.81)
RDW: 15.3 % (ref 11.5–15.5)
WBC: 9 10*3/uL (ref 4.0–10.5)

## 2018-02-01 LAB — BASIC METABOLIC PANEL
Anion gap: 12 (ref 5–15)
BUN: 36 mg/dL — AB (ref 6–20)
CHLORIDE: 101 mmol/L (ref 101–111)
CO2: 29 mmol/L (ref 22–32)
CREATININE: 1.66 mg/dL — AB (ref 0.61–1.24)
Calcium: 9 mg/dL (ref 8.9–10.3)
GFR calc Af Amer: 45 mL/min — ABNORMAL LOW (ref >60)
GFR calc non Af Amer: 39 mL/min — ABNORMAL LOW (ref >60)
Glucose, Bld: 135 mg/dL — ABNORMAL HIGH (ref 65–99)
Potassium: 4.6 mmol/L (ref 3.5–5.1)
SODIUM: 142 mmol/L (ref 135–145)

## 2018-02-01 LAB — GLUCOSE, CAPILLARY
Glucose-Capillary: 114 mg/dL — ABNORMAL HIGH (ref 65–99)
Glucose-Capillary: 84 mg/dL (ref 65–99)
Glucose-Capillary: 88 mg/dL (ref 65–99)
Glucose-Capillary: 121 mg/dL — ABNORMAL HIGH (ref 65–99)

## 2018-02-01 MED ORDER — CLONAZEPAM 1 MG PO TABS: 1.0000 mg | ORAL_TABLET | Freq: Every day | ORAL | 0 refills | Status: DC

## 2018-02-01 MED ORDER — CLOPIDOGREL BISULFATE 75 MG PO TABS: 75.0000 mg | ORAL_TABLET | Freq: Every day | ORAL | 1 refills | Status: DC

## 2018-02-01 MED ORDER — BLOOD GLUCOSE METER KIT: PACK | 0 refills | Status: DC

## 2018-02-01 MED ORDER — INSULIN DETEMIR 100 UNIT/ML FLEXPEN: 14.0000 [IU] | PEN_INJECTOR | Freq: Two times a day (BID) | SUBCUTANEOUS | 11 refills | Status: DC

## 2018-02-01 MED ORDER — FUROSEMIDE 40 MG PO TABS: 40.0000 mg | ORAL_TABLET | Freq: Every day | ORAL | 1 refills | Status: DC

## 2018-02-01 MED ORDER — AMIODARONE HCL 200 MG PO TABS: 200.0000 mg | ORAL_TABLET | Freq: Two times a day (BID) | ORAL | 0 refills | Status: DC

## 2018-02-01 MED ORDER — MELATONIN 3 MG PO TABS: 1.5000 mg | ORAL_TABLET | Freq: Every day | ORAL | 0 refills | Status: DC

## 2018-02-01 MED ORDER — GUAIFENESIN ER 600 MG PO TB12: 600.0000 mg | ORAL_TABLET | Freq: Two times a day (BID) | ORAL | 0 refills | Status: DC

## 2018-02-01 MED ORDER — TRAMADOL HCL 50 MG PO TABS: 50.0000 mg | ORAL_TABLET | Freq: Four times a day (QID) | ORAL | 0 refills | Status: DC | PRN

## 2018-02-01 MED ORDER — METOPROLOL TARTRATE 25 MG PO TABS: 12.5000 mg | ORAL_TABLET | Freq: Two times a day (BID) | ORAL | 0 refills | Status: DC

## 2018-02-01 NOTE — Progress Notes (Signed)
Social Work  Discharge Note  The overall goal for the admission was met for: DC SAT 5/25  Discharge location: Yes-HOME WITH WIFE WHO CAN PROVIDE SUPERVISION LEVEL  Length of Stay: Yes-5 DAYS  Discharge activity level: Yes-SUPERVISION LEVEL  Home/community participation: Yes  Services provided included: MD, RD, PT, OT, RN, CM, Pharmacy and SW  Financial Services: Private Insurance: [employer]  Follow-up services arranged: Home Health: [facility]-PT & RN, DME: [facility] and Patient/Family has no preference for HH/DME agencies  Comments (or additional information):WIFE HAS BEEN HERE AND ATTENDED [place]. PLEASED WITH HIS PROGRESS HERE.  Patient/Family verbalized understanding of follow-up arrangements: Yes  Individual responsible for coordination of the follow-up plan: CATHERINE-WIFE  Confirmed correct DME delivered: Elease Hashimoto 02/01/2018    Elease Hashimoto

## 2018-02-01 NOTE — Progress Notes (Signed)
[place] PHYSICAL MEDICINE & REHABILITATION     PROGRESS NOTE  Subjective/Complaints:  Patient seen sitting up in chair this morning. He states he slept fairly overnight due to back and bottom pain from sitting in bed chair. He states he has to go home.  ROS: denies CP, SOB, N/V/D.  Objective: Vital Signs: Blood pressure (!) 129/54, pulse 79, temperature 97.9 F (36.6 C), temperature source Oral, resp. rate 18, weight 129.8 kg (286 lb 1 oz), SpO2 96 %. No results found. Recent Labs    02/01/18 0557  WBC 9.0  HGB 9.1*  HCT 29.5*  PLT 440*   Recent Labs    02/01/18 0557  NA 142  K 4.6  CL 101  GLUCOSE 135*  BUN 36*  CREATININE 1.66*  CALCIUM 9.0   CBG (last 3)  Recent Labs    01/31/18 1646 01/31/18 2117 02/01/18 0646  GLUCAP 94 108* 114*    Wt Readings from Last 3 Encounters:  02/01/18 129.8 kg (286 lb 1 oz)  01/28/18 132.8 kg (292 lb 12.3 oz)  01/16/18 127.7 kg (281 lb 8 oz)    Physical Exam:  BP (!) 129/54 (BP Location: Right Arm)   Pulse 79   Temp 97.9 F (36.6 C) (Oral)   Resp 18   Wt 129.8 kg (286 lb 1 oz)   SpO2 96%   BMI 41.05 kg/m  Constitutional: He appears well-developed. Morbidly obese  HENT: Normocephalic and atraumatic.  Eyes: EOM are normal. No discharge.  Cardiovascular: RRR. No JVD. Respiratory: Effort normal and breath sounds normal.  GI: Bowel sounds are normal. Distention, stable Musculoskeletal: He exhibits no tenderness. Anasarca, stable Neurological: He is alert and oriented.  Motor: Bilateral upper extremities 4+/5 proximal distal Bilateral lower extremities: Hip flexion 4+/5, knee extension 4+/5, ankle dorsiflexion 4+/5 (stable) Skin: Midline chest incision clean/dry/intact Psychiatric: He has a normal mood and affect. His behavior is normal.   Assessment/Plan: 1. Functional deficits secondary to debility which require 3+ hours per day of interdisciplinary therapy in a comprehensive inpatient rehab setting. Physiatrist is  providing close team supervision and 24 hour management of active medical problems listed below. Physiatrist and rehab team continue to assess barriers to discharge/monitor patient progress toward functional and medical goals.  Function:  Bathing Bathing position   Position: Shower  Bathing parts Body parts bathed by patient: Right arm, Left arm, Chest, Abdomen, Front perineal area, Buttocks, Right upper leg, Left upper leg, Left lower leg, Right lower leg, Back Body parts bathed by helper: Back, Left lower leg, Right lower leg  Bathing assist Assist Level: Touching or steadying assistance(Pt > 75%)      Upper Body Dressing/Undressing Upper body dressing   What is the patient wearing?: Pull over shirt/dress     Pull over shirt/dress - Perfomed by patient: Thread/unthread right sleeve, Thread/unthread left sleeve, Put head through opening, Pull shirt over trunk          Upper body assist Assist Level: Supervision or verbal cues      Lower Body Dressing/Undressing Lower body dressing   What is the patient wearing?: Pants, [employer], Underwear, Shoes Underwear - Performed by patient: Thread/unthread right underwear leg, Thread/unthread left underwear leg, Pull underwear up/down Underwear - Performed by helper: Thread/unthread right underwear leg, Thread/unthread left underwear leg, Pull underwear up/down Pants- Performed by patient: Thread/unthread right pants leg, Thread/unthread left pants leg, Pull pants up/down Pants- Performed by helper: Thread/unthread right pants leg, Thread/unthread left pants leg, Pull pants up/down Non-skid  slipper socks- Performed by patient: Don/doff right sock, Don/doff left sock(doff with reacher)       Shoes - Performed by patient: Don/doff right shoe, Don/doff left shoe Shoes - Performed by helper: Don/doff right shoe, Don/doff left shoe, Fasten right, Fasten left       TED Hose - Performed by helper: Don/doff right TED hose, Don/doff left TED  hose  Lower body assist Assist for lower body dressing: Touching or steadying assistance (Pt > 75%)      Toileting Toileting   Toileting steps completed by patient: Adjust clothing prior to toileting   Toileting Assistive Devices: Grab bar or rail  Toileting assist Assist level: Supervision or verbal cues   Transfers Chair/bed transfer   Chair/bed transfer method: Stand pivot Chair/bed transfer assist level: Touching or steadying assistance (Pt > 75%) Chair/bed transfer assistive device: Medical sales representative     Max distance: 100' Assist level: Supervision or verbal cues   Wheelchair     Max wheelchair distance: 75 Assist Level: Touching or steadying assistance (Pt > 75%)(BLEs)  Cognition Comprehension Comprehension assist level: Follows complex conversation/direction with extra time/assistive device  Expression Expression assist level: Expresses complex ideas: With extra time/assistive device  Social Interaction Social Interaction assist level: Interacts appropriately with others with medication or extra time (anti-anxiety, antidepressant).  Problem Solving Problem solving assist level: Solves basic problems with no assist  Memory Memory assist level: More than reasonable amount of time    Medical Problem List and Plan: 1.  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  Sternal precautions  Continue CIR, DC tomorrow  Will see patient for transitional care management in 1-2 weeks 2.  DVT Prophylaxis/Anticoagulation: Lovenox 30 mg daily.  Monitor for any bleeding episodes 3. Pain Management: Ultram as needed 4. Mood: Klonopin 1 mg nightly  Will likely require encouragement 5. Neuropsych: This patient is capable of making decisions on his own behalf. 6. Skin/Wound Care: Routine skin checks 7. Fluids/Electrolytes/Nutrition: Routine in and outs 8.  Acute blood loss anemia.     Hb 9.1 on 5/24   Cont to monitor 9.  Diabetes mellitus  with peripheral neuropathy.  Hemoglobin A1c 6.8.  Levemir 14 units twice daily.  Check blood sugars before meals and at bedtime.  Diabetic teaching   Overall controlled on 5/24 10.  Hypertension.  Lopressor 12.5 mg twice daily, amiodarone 200 mg twice daily and Lasix 40 mg daily   Relatively controlled on 5/24 11.  CKD stage III.  Follow-up renal services as needed.     Cr. 1.66 on 5/24   Cont to monitor 12.  Morbid Obesity.  BMI 42.42.  Dietary follow-up 13.  Constipation.  Laxative assistance 14.  Hyperlipidemia.  Zocor 15. Hypoalbuminemia  Supplement initiated on 5/21 16. Sleep disturbance  Melatonin started on 5/22 with benefit  LOS (Days) 4 A FACE TO FACE EVALUATION WAS PERFORMED  Reveca Desmarais Lorie Phenix 02/01/2018 9:08 AM

## 2018-02-01 NOTE — Progress Notes (Signed)
Occupational Therapy Discharge Summary  Patient Details  Name: [name] Young. MRN: [id number] Date of Birth: 1941/08/26  Today's Date: 02/01/2018 OT Individual Time: 0800-0900 OT Individual Time Calculation (min): 60 min    Session Note:  Pt completed shower and dressing during session.  He voiced not sleeping well and feeling really weak.  Min instructional cueing with supervision for hand placement with sit to stand.  Supervision to complete shower transfer with use of the RW.  One LOB posteriorly when attempting to stand and doff his shirt, requiring min assist to correct.  He was able to remove his gripper socks with use of the reacher and also dried his feet using it after washing them.  Dressing sit to stand at the wheelchair with supervision, but pt initially attempted to donn underpants in standing.  After being unsuccessful with this technique he then transferred to the wheelchair and utilized reacher to thread them over his feet.  Therapist assisted with compression socks and then pt donned his shoes with use of the reacher and the shoe funnel.  Pt left in chair with chair alarm in place and call button and phone in reach at end of session.  Pt working on breakfast as well.     Patient has met 8 of 9 long term goals due to improved activity tolerance, improved balance and ability to compensate for deficits.  Patient to discharge at overall Supervision level.  Patient's care partner is independent to provide the necessary physical assistance at discharge.    Reasons goals not met: Pt needs min to mod assist for tub transfers to lift LEs into and out of the tub  Recommendation:  Patient will benefit from ongoing skilled OT services in home health setting to continue to advance functional skills in the area of BADL.  Pt still needs supervision for safety with B/D tasks sit to stand.  He continues to try and stand at times to donn clothing, even though he has been cued this is not safe  and has lost his balance on occasion.  He also still demonstrates decreased flexibility and endurance overall.  Feel he will benefit from continued HHOT to address these deficits and increase independence in order to return back to PLOF.    Equipment: tub bench  Reasons for discharge: treatment goals met and discharge from hospital  Patient/family agrees with progress made and goals achieved: Yes  OT Discharge Precautions/Restrictions  Precautions Precautions: Fall;Sternal Precaution Booklet Issued: No Precaution Comments: Requires min cues to adhere to sternal precautions  Pain Pain Assessment Pain Scale: Faces Faces Pain Scale: Hurts little more Pain Type: Acute pain Pain Location: Buttocks Pain Orientation: Lower Pain Descriptors / Indicators: Aching;Discomfort Pain Intervention(s): Repositioned ADL  See function section of chart for details Vision Baseline Vision/History: No visual deficits Patient Visual Report: No change from baseline Vision Assessment?: No apparent visual deficits Perception  Perception: Within Functional Limits Praxis Praxis: Intact Cognition Overall Cognitive Status: Within Functional Limits for tasks assessed Arousal/Alertness: Awake/alert Orientation Level: Oriented X4 Attention: Sustained Memory: Appears intact Awareness: Impaired Awareness Impairment: Anticipatory impairment Problem Solving: Appears intact Safety/Judgment: Impaired Comments: Pt needs cueing for safety as he tries to do things in standing at times as he did before this admission.  Feel this is just his nature not related to new cognitive impairment. Sensation Sensation Light Touch: Appears Intact Stereognosis: Appears Intact Hot/Cold: Appears Intact Proprioception: Appears Intact Additional Comments: Sensation intact in BUEs Coordination Gross Motor Movements are Fluid and Coordinated:  Yes Fine Motor Movements are Fluid and Coordinated: Yes Coordination and Movement  Description: Coordination WFLs for BUEs Motor  Motor Motor - Discharge Observations: Still with generalized weakness overall Mobility    See Function Section of chart for details  Trunk/Postural Assessment  Cervical Assessment Cervical Assessment: Within Functional Limits Thoracic Assessment Thoracic Assessment: Within Functional Limits Lumbar Assessment Lumbar Assessment: Within Functional Limits Postural Control Postural Control: (posterior pelvic tilt in sitting)  Balance Balance Balance Assessed: Yes Static Sitting Balance Static Sitting - Balance Support: No upper extremity supported Static Sitting - Level of Assistance: 6: Modified independent (Device/Increase time) Dynamic Sitting Balance Dynamic Sitting - Balance Support: No upper extremity supported;During functional activity Dynamic Sitting - Level of Assistance: 6: Modified independent (Device/Increase time) Static Standing Balance Static Standing - Balance Support: During functional activity Static Standing - Level of Assistance: 6: Modified independent (Device/Increase time) Dynamic Standing Balance Dynamic Standing - Balance Support: During functional activity Dynamic Standing - Level of Assistance: 6: Modified independent (Device/Increase time)(with use of RW or hand on stable surface during selfcare tasks) Extremity/Trunk Assessment RUE Assessment RUE Assessment: Within Functional Limits(Strength at least 3+/5 but not formally tested secondary to precautions.) LUE Assessment LUE Assessment: Within Functional Limits(Strength at least 3+/5 but not formally tested secondary to precautions.)   See Function Navigator for Current Functional Status.  Karle Desrosier OTR/L 02/01/2018, 12:20 PM

## 2018-02-01 NOTE — Discharge Summary (Signed)
Discharge summary job [id number]

## 2018-02-01 NOTE — Discharge Summary (Signed)
NAME: [name] Young, [name] Young MEDICAL RECORD [id number] ACCOUNT [id number] DATE OF BIRTH:1941/05/15 FACILITY: [facility] LOCATION: [facility] PHYSICIAN:ANKIT PATEL, MD  DISCHARGE SUMMARY  DATE OF DISCHARGE:  02/02/2018  DISCHARGE DIAGNOSES: 1.  Debility secondary to coronary artery bypass graft 01/18/2018. 2.  Subcutaneous Lovenox for deep venous thrombosis prophylaxis. 3.  Mood. 4.  Acute blood loss anemia. 5.  Diabetes mellitus, peripheral neuropathy. 6.  Hypertension. 7.  Chronic kidney disease, stage III. 8.  Morbid obesity. 9.  Constipation. 10.  Hyperlipidemia.  HISTORY OF PRESENT ILLNESS:  This is a 77 year old right-handed male with history of hypertension, diabetes mellitus, CKD stage III.  He lives with his wife.  Independent prior to admission.  Presented 01/18/2018 with increasing shortness of breath  followed by cardiothoracic surgery with recent echocardiogram showing ejection fraction of 45%.  No significant regurgitation.  EKG nonspecific.  Cardiac catheterization demonstrated chronic occlusion of the RCA with high-grade LAD diagonal stenosis.   Underwent CABG x3 01/18/2018 per Dr. Tharon Aquas Trigt.  HOSPITAL COURSE:  Sternal precautions.  Renal service followup for elevated creatinine 3.3 from baseline 1.6.  No acute indications for dialysis.  Creatinine stabilizing 1.62 and renal service is signing off.  Acute blood loss anemia 7.3, transfused.   Hemoglobin followup 9.6.  Remained on aspirin and Plavix after CABG.  Subcutaneous Lovenox for DVT prophylaxis as well as tolerating a regular diet.  The patient was admitted for a comprehensive rehabilitation program.  PAST MEDICAL HISTORY:  See discharge diagnoses.  SOCIAL HISTORY:  Lives with wife.  Independent prior to admission.  FUNCTIONAL STATUS:  Upon admission to rehab services was minimal assist sit to stand, minimal assist 210 feet rolling walker, mod/max assist with activities of daily living.  PHYSICAL  EXAMINATION: VITAL SIGNS:  Blood pressure 128/69, pulse 80, temperature 98, respirations 19. GENERAL:  Alert male in no acute distress.  EOMs intact. NECK:  Supple, nontender.  No JVD. CARDIAC:  Rate controlled.   ABDOMEN:  Soft, nontender, good bowel sounds. LUNGS:  Clear to auscultation without wheeze. SKIN:  Cardiac site clean and dry.  REHABILITATION HOSPITAL COURSE:  The patient was admitted to inpatient rehab services.  Therapies initiated on a 3-hour daily basis, consisting of physical therapy, occupational therapy and rehabilitation nursing.  The following issues were addressed  during patient's rehabilitation stay. 1.  Pertaining to the patient CABG on 01/18/2018, sternal precautions.  Surgical site healing nicely.  He would follow up with cardiothoracic surgery.  Remained on subcutaneous Lovenox for DVT prophylaxis.  No bleeding episodes.  Mood stable.  Maintained  on Klonopin.  Tolerating therapies. 2.  Diabetes mellitus with peripheral neuropathy.  Hemoglobin A1c of 6.8.  Insulin therapy is directed with full diabetic teaching. 3.  Blood pressures remain controlled with present regimen.  No signs of fluid overload. 4.  CKD stage III, creatinine 1.64 at baseline. 5.  Morbid obesity.  BMI of 42.42.  Dietary followup. 6.  Bouts of constipation, resolved with laxative assistance.  The patient received weekly collaborative interdisciplinary team conferences to discuss  estimated length of stay, family teaching, any barriers to his discharge.  Ambulating rolling walker, supervision, working with energy conservation techniques,  manages clothing, performs hygiene with supervision, performs bed mobility on a flat bed with supervision.  Full family teaching was completed and plan was discharge to home.  DISCHARGE MEDICATIONS:  Included amiodarone 200 mg p.o. b.i.d., aspirin 81 mg p.o. daily, Klonopin 1 mg p.o. at bedtime, Plavix 75 mg p.o. daily, Colace daily, hold for  loose stools.  Lasix  40 mg p.o. daily, Levemir 14 units b.i.d., melatonin 1.5 mg p.o.  at bedtime, Lopressor 12.5 mg p.o. b.i.d., Protonix 40 mg p.o. daily, Zocor 20 mg p.o. at bedtime, Ultram 50 mg p.o. every 6 hours as needed for pain.  His diet was a diabetic diet.  SPECIAL INSTRUCTIONS:  Sternal precautions and no driving.  The patient will follow up with Dr. Delice Lesch at the outpatient rehab service office as advised by Dr. Tharon Aquas Trigt.  Call for appointment.  Dr. Vanetta Mulders as needed.  Dr. Ria Bush, medical management.  Dr. Harrell Gave End, cardiology services.  Call for appointment.  LN/NUANCE D:02/01/2018 T:02/01/2018 JOB:[id number]

## 2018-02-01 NOTE — Progress Notes (Signed)
Physical Therapy Session Note  Patient Details  Name: [name] Young. MRN: [id number] Date of Birth: [birth year]  Today's Date: 02/01/2018 PT Individual Time: 1300-1411 PT Individual Time Calculation (min): 71 min   Short Term Goals: Week 1:  PT Short Term Goal 1 (Week 1): =LTG due to estimated LOS  Skilled Therapeutic Interventions/Progress Updates:    Pt seated in w/c upon PT arrival, agreeable to therapy tx and denies pain. Pt ambulated from recliner<>bathroom with RW and supervision, maintained standing balance while using urinal. Pt ambulated to the sink with RW and supervision, washed hands. Pt ambulated from room>gym x 100 ft with RW and supervision. Pt performed sit<>stands throughout session from the mat with supervision. Pt worked on dynamic standing balance without UE support to stand on foam airex while tossing horseshoes, x 2 trials with min assist. Pt worked on dynamic standing balance without UE support to perform toe taps on 1 inch step x 10 each foot, x 2 trials with min assist. Pt ambulated x 200 ft throughout unit with RW and supervision working on endurance, decreased gait speed and shortened step length. Pt ambulated back to room and left seated in recliner with needs in reach and wife present, chair alarm set.   Therapy Documentation Precautions:  Precautions Precautions: Fall, Sternal Precaution Booklet Issued: No Precaution Comments: Requires min cues to adhere to sternal precautions Restrictions Weight Bearing Restrictions: Yes(Strenal Precautions ) Other Position/Activity Restrictions: sternal precautions   See Function Navigator for Current Functional Status.   Therapy/Group: Individual Therapy  Netta Corrigan, PT, DPT 02/01/2018, 7:41 AM

## 2018-02-01 NOTE — Progress Notes (Addendum)
Physical Therapy Discharge Summary  Patient Details  Name: [name] Young. MRN: [id number] Date of Birth: [birth year]  Today's Date: 02/01/2018 PT Individual Time: 1000-1100 PT Individual Time Calculation (min): 60 min    Patient has met 9 of 9 long term goals due to improved activity tolerance, improved balance, improved postural control, increased strength, decreased pain, ability to compensate for deficits and functional use of  right lower extremity and left lower extremity.  Patient to discharge at an ambulatory level Supervision.   Patient's care partner is independent to provide the necessary physical assistance at discharge.  Reasons goals not met: All goals met  Recommendation:  Patient will benefit from ongoing skilled PT services in home health setting to continue to advance safe functional mobility, address ongoing impairments in strength, ROM, activity tolerance, balance, and minimize fall risk.  Equipment: RW  Reasons for discharge: treatment goals met and discharge from hospital  Patient/family agrees with progress made and goals achieved: Yes  PT Discharge Precautions/Restrictions Precautions Precautions: Fall;Sternal Precaution Booklet Issued: No Precaution Comments: Requires min cues to adhere to sternal precautions Vision/Perception  Perception Perception: Within Functional Limits Praxis Praxis: Intact  Cognition Overall Cognitive Status: Within Functional Limits for tasks assessed Arousal/Alertness: Awake/alert Orientation Level: Oriented X4 Attention: Sustained Memory: Appears intact Awareness: Impaired Awareness Impairment: Anticipatory impairment Problem Solving: Appears intact Safety/Judgment: Impaired Comments: Pt needs cueing for safety as he tries to do things in standing at times as he did before this admission.  Feel this is just his nature not related to new cognitive impairment. Sensation Sensation Light Touch: Appears  Intact Stereognosis: Appears Intact Hot/Cold: Appears Intact Proprioception: Appears Intact Additional Comments: Sensation intact in BUEs Coordination Gross Motor Movements are Fluid and Coordinated: Yes Fine Motor Movements are Fluid and Coordinated: Yes Coordination and Movement Description: Coordination WFLs for BUEs Motor  Motor Motor - Discharge Observations: Still with generalized weakness overall  Mobility Bed Mobility Bed Mobility: Supine to Sit;Sit to Supine Supine to Sit: 6: Modified independent (Device/Increase time);HOB flat Sit to Supine: 6: Modified independent (Device/Increase time);HOB flat Transfers Transfers: Yes Sit to Stand: 6: Modified independent (Device/Increase time) Stand Pivot Transfers: 6: Modified independent (Device/Increase time) Locomotion  Ambulation Ambulation: Yes Ambulation/Gait Assistance: 5: Supervision Ambulation Distance (Feet): 150 Feet Assistive device: Rolling walker Ambulation/Gait Assistance Details: Verbal cues for safe use of DME/AE;Verbal cues for precautions/safety Gait Gait: Yes Gait Pattern: Impaired Gait Pattern: Poor foot clearance - right;Poor foot clearance - left;Right foot flat;Left foot flat;Step-through pattern Gait velocity: 1.0 ft/sec; indicative of household ambulator Stairs / Additional Locomotion Stairs: Yes Stairs Assistance: 5: Supervision Stair Management Technique: One rail Left;Step to pattern;Forwards Number of Stairs: 4 Height of Stairs: 6 Ramp: 5: Supervision Curb: 5: Supervision Wheelchair Mobility Wheelchair Mobility: No  Trunk/Postural Assessment  Cervical Assessment Cervical Assessment: Within Functional Limits Thoracic Assessment Thoracic Assessment: Within Functional Limits Lumbar Assessment Lumbar Assessment: Within Functional Limits Postural Control Postural Control: (posterior pelvic tilt in sitting)  Balance Balance Balance Assessed: Yes Static Sitting Balance Static Sitting -  Balance Support: No upper extremity supported Static Sitting - Level of Assistance: 6: Modified independent (Device/Increase time) Dynamic Sitting Balance Dynamic Sitting - Balance Support: No upper extremity supported;During functional activity Dynamic Sitting - Level of Assistance: 6: Modified independent (Device/Increase time) Static Standing Balance Static Standing - Balance Support: During functional activity Static Standing - Level of Assistance: 6: Modified independent (Device/Increase time) Dynamic Standing Balance Dynamic Standing - Balance Support: During functional activity Dynamic Standing - Level of  Assistance: 6: Modified independent (Device/Increase time)(with use of RW or hand on stable surface during selfcare tasks) Extremity Assessment  RUE Assessment RUE Assessment: Within Functional Limits(Strength at least 3+/5 but not formally tested secondary to precautions.) LUE Assessment LUE Assessment: Within Functional Limits(Strength at least 3+/5 but not formally tested secondary to precautions.) RLE Assessment RLE Assessment: Exceptions to WFL(grossly 4/5 throughout) LLE Assessment LLE Assessment: Exceptions to WFL(grossly 4/5 throughout)  Skilled Therapeutic Intervention: Pt received seated on toilet having bowel movement; required increased time to complete. Assessed mobility as above with S overall, modI transfers and bed mobility. Discussed home health follow up and goal to move away from RW however do strongly recommend RW use at this time. Pt remained seated in recliner at end of session, all needs in reach.    See Function Navigator for Current Functional Status.  [name] 02/01/2018, 10:58 AM

## 2018-02-02 NOTE — Progress Notes (Signed)
Pt in room with wife ready to go home, went over medications, doses and time prior to discharge.

## 2018-02-02 NOTE — Progress Notes (Signed)
[facility] PHYSICAL MEDICINE & REHABILITATION     PROGRESS NOTE  Subjective/Complaints:  Feels ready to go home, d/c instructions and Rx received fronm PA yesterday  ROS: denies CP, SOB, N/V/D.  Objective: Vital Signs: Blood pressure (!) 160/82, pulse 77, temperature 98.1 F (36.7 C), temperature source Oral, resp. rate 18, weight 130 kg (286 lb 9.6 oz), SpO2 96 %. No results found. Recent Labs    02/01/18 0557  WBC 9.0  HGB 9.1*  HCT 29.5*  PLT 440*   Recent Labs    02/01/18 0557  NA 142  K 4.6  CL 101  GLUCOSE 135*  BUN 36*  CREATININE 1.66*  CALCIUM 9.0   CBG (last 3)  Recent Labs    02/01/18 1146 02/01/18 1633 02/01/18 2106  GLUCAP 84 88 121*    Wt Readings from Last 3 Encounters:  02/02/18 130 kg (286 lb 9.6 oz)  01/28/18 132.8 kg (292 lb 12.3 oz)  01/16/18 127.7 kg (281 lb 8 oz)    Physical Exam:  BP (!) 160/82 (BP Location: Right Arm)   Pulse 77   Temp 98.1 F (36.7 C) (Oral)   Resp 18   Wt 130 kg (286 lb 9.6 oz)   SpO2 96%   BMI 41.12 kg/m  Constitutional: He appears well-developed. Morbidly obese  HENT: Normocephalic and atraumatic.  Eyes: EOM are normal. No discharge.  Cardiovascular: RRR. No JVD. Respiratory: Effort normal and breath sounds normal.  GI: Bowel sounds are normal. Distention, stable Musculoskeletal: He exhibits no tenderness. Anasarca, stable Neurological: He is alert and oriented.  Motor: Bilateral upper extremities 4+/5 proximal distal Bilateral lower extremities: Hip flexion 4+/5, knee extension 4+/5, ankle dorsiflexion 4+/5 (stable) Skin: Midline chest incision clean/dry/intact Psychiatric: He has a normal mood and affect. His behavior is normal.   Assessment/Plan: 1. Functional deficits secondary to debility Stable for D/C today F/u PCP in 3-4 weeks F/u PM&R 2 weeks See D/C summary See D/C instructions Function:  Bathing Bathing position   Position: Shower  Bathing parts Body parts bathed by patient: Right  arm, Left arm, Chest, Abdomen, Front perineal area, Buttocks, Right upper leg, Left upper leg, Left lower leg, Right lower leg, Back Body parts bathed by helper: Back, Left lower leg, Right lower leg  Bathing assist Assist Level: Supervision or verbal cues      Upper Body Dressing/Undressing Upper body dressing   What is the patient wearing?: Pull over shirt/dress     Pull over shirt/dress - Perfomed by patient: Thread/unthread right sleeve, Thread/unthread left sleeve, Put head through opening, Pull shirt over trunk          Upper body assist Assist Level: Supervision or verbal cues      Lower Body Dressing/Undressing Lower body dressing   What is the patient wearing?: Pants, [employer], Underwear, Shoes Underwear - Performed by patient: Thread/unthread right underwear leg, Thread/unthread left underwear leg, Pull underwear up/down Underwear - Performed by helper: Thread/unthread right underwear leg, Thread/unthread left underwear leg, Pull underwear up/down Pants- Performed by patient: Thread/unthread right pants leg, Thread/unthread left pants leg, Pull pants up/down Pants- Performed by helper: Thread/unthread right pants leg, Thread/unthread left pants leg, Pull pants up/down Non-skid slipper socks- Performed by patient: Don/doff right sock, Don/doff left sock(doff with reacher)       Shoes - Performed by patient: Don/doff right shoe, Don/doff left shoe, Fasten right, Fasten left(shoes already fastened) Shoes - Performed by helper: Don/doff right shoe, Don/doff left shoe, Fasten right, Fasten left  TED Hose - Performed by helper: Don/doff right TED hose, Don/doff left TED hose  Lower body assist Assist for lower body dressing: Touching or steadying assistance (Pt > 75%)      Toileting Toileting   Toileting steps completed by patient: Adjust clothing prior to toileting, Performs perineal hygiene, Adjust clothing after toileting   Toileting Assistive Devices: Grab bar  or rail  Toileting assist Assist level: Supervision or verbal cues   Transfers Chair/bed transfer   Chair/bed transfer method: Stand pivot Chair/bed transfer assist level: No Help, no cues, assistive device, takes more than a reasonable amount of time Chair/bed transfer assistive device: Medical sales representative     Max distance: 150 Assist level: Supervision or verbal cues   Wheelchair     Max wheelchair distance: 75 Assist Level: Supervision or verbal cues  Cognition Comprehension Comprehension assist level: Follows complex conversation/direction with extra time/assistive device  Expression Expression assist level: Expresses complex ideas: With extra time/assistive device  Social Interaction Social Interaction assist level: Interacts appropriately with others with medication or extra time (anti-anxiety, antidepressant).  Problem Solving Problem solving assist level: Solves basic problems with no assist  Memory Memory assist level: More than reasonable amount of time    Medical Problem List and Plan: 1.  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  Sternal precautions  , DC today  Will see patient for transitional care management in 1-2 weeks 2.  DVT Prophylaxis/Anticoagulation: Lovenox 30 mg daily.  Monitor for any bleeding episodes 3. Pain Management: Ultram as needed 4. Mood: Klonopin 1 mg nightly  Will likely require encouragement 5. Neuropsych: This patient is capable of making decisions on his own behalf. 6. Skin/Wound Care: Routine skin checks 7. Fluids/Electrolytes/Nutrition: Routine in and outs 8.  Acute blood loss anemia.     Hb 9.1 on 5/24   Cont to monitor 9.  Diabetes mellitus with peripheral neuropathy.  Hemoglobin A1c 6.8.  Levemir 14 units twice daily.  Check blood sugars before meals and at bedtime.  Diabetic teaching CBG (last 3)  Recent Labs    02/01/18 1146 02/01/18 1633 02/01/18 2106  GLUCAP 84 88 121*      Overall controlled on 5/25 10.  Hypertension.  Lopressor 12.5 mg twice daily, amiodarone 200 mg twice daily and Lasix 40 mg daily Vitals:   02/01/18 2039 02/02/18 0522  BP: (!) 139/102 (!) 160/82  Pulse: 76 77  Resp:  18  Temp:  98.1 F (36.7 C)  SpO2:  96%    Fluctuating, f/u with PCP 11.  CKD stage III.  Follow-up renal services as needed.     Cr. 1.66 on 5/24-stable   Cont to monitor 12.  Morbid Obesity.  BMI 42.42.  Dietary follow-up 13.  Constipation.  Laxative assistance 14.  Hyperlipidemia.  Zocor 15. Hypoalbuminemia  Supplement initiated on 5/21   LOS (Days) 5 A FACE TO FACE EVALUATION WAS PERFORMED  Charlett Blake 02/02/2018 7:17 AM

## 2018-02-05 ENCOUNTER — Telehealth: Payer: Self-pay | Admitting: *Deleted

## 2018-02-05 LAB — GLUCOSE, CAPILLARY: GLUCOSE-CAPILLARY: 99 mg/dL (ref 65–99)

## 2018-02-05 NOTE — Telephone Encounter (Signed)
Lm requesting return call to complete TCM and confirm hosp f/u appt  

## 2018-02-08 ENCOUNTER — Encounter: Payer: Self-pay | Admitting: Family Medicine

## 2018-02-08 ENCOUNTER — Ambulatory Visit: Payer: Medicare Other | Admitting: Family Medicine

## 2018-02-08 VITALS — BP 130/70 | HR 75 | Temp 98.2°F | Ht 71.0 in | Wt 281.8 lb

## 2018-02-08 DIAGNOSIS — R5381 Other malaise: Secondary | ICD-10-CM

## 2018-02-08 DIAGNOSIS — I4891 Unspecified atrial fibrillation: Secondary | ICD-10-CM

## 2018-02-08 DIAGNOSIS — E1142 Type 2 diabetes mellitus with diabetic polyneuropathy: Secondary | ICD-10-CM | POA: Diagnosis not present

## 2018-02-08 DIAGNOSIS — I25119 Atherosclerotic heart disease of native coronary artery with unspecified angina pectoris: Secondary | ICD-10-CM

## 2018-02-08 DIAGNOSIS — E118 Type 2 diabetes mellitus with unspecified complications: Secondary | ICD-10-CM | POA: Diagnosis not present

## 2018-02-08 DIAGNOSIS — N183 Chronic kidney disease, stage 3 unspecified: Secondary | ICD-10-CM

## 2018-02-08 DIAGNOSIS — D62 Acute posthemorrhagic anemia: Secondary | ICD-10-CM

## 2018-02-08 DIAGNOSIS — Z951 Presence of aortocoronary bypass graft: Secondary | ICD-10-CM | POA: Diagnosis not present

## 2018-02-08 DIAGNOSIS — I480 Paroxysmal atrial fibrillation: Secondary | ICD-10-CM | POA: Insufficient documentation

## 2018-02-08 DIAGNOSIS — I10 Essential (primary) hypertension: Secondary | ICD-10-CM | POA: Diagnosis not present

## 2018-02-08 DIAGNOSIS — E785 Hyperlipidemia, unspecified: Secondary | ICD-10-CM

## 2018-02-08 LAB — CBC WITH DIFFERENTIAL/PLATELET
Basophils Absolute: 0.2 10*3/uL — ABNORMAL HIGH (ref 0.0–0.1)
Basophils Relative: 3.7 % — ABNORMAL HIGH (ref 0.0–3.0)
EOS PCT: 4 % (ref 0.0–5.0)
Eosinophils Absolute: 0.2 10*3/uL (ref 0.0–0.7)
HEMATOCRIT: 32.2 % — AB (ref 39.0–52.0)
Hemoglobin: 10.6 g/dL — ABNORMAL LOW (ref 13.0–17.0)
LYMPHS ABS: 1.8 10*3/uL (ref 0.7–4.0)
LYMPHS PCT: 31.9 % (ref 12.0–46.0)
MCHC: 32.9 g/dL (ref 30.0–36.0)
MCV: 92 fl (ref 78.0–100.0)
MONOS PCT: 8.8 % (ref 3.0–12.0)
Monocytes Absolute: 0.5 10*3/uL (ref 0.1–1.0)
NEUTROS ABS: 2.9 10*3/uL (ref 1.4–7.7)
NEUTROS PCT: 51.6 % (ref 43.0–77.0)
PLATELETS: 353 10*3/uL (ref 150.0–400.0)
RBC: 3.5 Mil/uL — ABNORMAL LOW (ref 4.22–5.81)
RDW: 15.8 % — ABNORMAL HIGH (ref 11.5–15.5)
WBC: 5.6 10*3/uL (ref 4.0–10.5)

## 2018-02-08 LAB — RENAL FUNCTION PANEL
ALBUMIN: 3.6 g/dL (ref 3.5–5.2)
BUN: 19 mg/dL (ref 6–23)
CALCIUM: 9.2 mg/dL (ref 8.4–10.5)
CO2: 30 mEq/L (ref 19–32)
Chloride: 101 mEq/L (ref 96–112)
Creatinine, Ser: 1.38 mg/dL (ref 0.40–1.50)
GFR: 53.3 mL/min — ABNORMAL LOW (ref >60.00)
GLUCOSE: 101 mg/dL — AB (ref 70–99)
POTASSIUM: 4.8 meq/L (ref 3.5–5.1)
Phosphorus: 4 mg/dL (ref 2.3–4.6)
Sodium: 140 mEq/L (ref 135–145)

## 2018-02-08 LAB — MICROALBUMIN / CREATININE URINE RATIO
Creatinine,U: 26 mg/dL
Microalb Creat Ratio: 2.7 mg/g (ref 0.0–30.0)
Microalb, Ur: <0.7 mg/dL (ref 0.0–1.9)

## 2018-02-08 NOTE — Assessment & Plan Note (Signed)
Off metformin and januvia due to recent ARF. Tolerating levemir 14u BID however desires to return to oral antihyperglycemics. Will update Cr today and if stable, consider slowly restarting.

## 2018-02-08 NOTE — Assessment & Plan Note (Addendum)
Continue tricor, simvastatin, fish oil.

## 2018-02-08 NOTE — Assessment & Plan Note (Signed)
Update CBC. 

## 2018-02-08 NOTE — Progress Notes (Signed)
BP 130/70 (BP Location: Left Arm, Patient Position: Sitting, Cuff Size: Large)   Pulse 75   Temp 98.2 F (36.8 C) (Oral)   Ht _0  (1.803 m)   Wt 281 lb 12 oz (127.8 kg)   SpO2 97%   BMI 39.30 kg/m    CC: hosp f/u visit Subjective:    Patient ID: [name] Luna., male    DOB: [birth year], 77 y.o.   MRN: [id number]  HPI: [name] Gehlhausen. is a 77 y.o. male presenting on 02/08/2018 for Hospitalization Follow-up (D/c on 01/18/18 from [facility]. )   Recent 3v CABG and coronary endarterectomy of LAD due to progressive dyspnea, exercise intolerance, with cardiovascular stress test showing inferior lateral scar, EF 45-50%, cardiac cath showed mod-severe diabetic disease, occlusion of RCA, and diffuse distal disease of LAD and circ. Underwent CABG 5/10 by Dr Lawson Fiscal, placed on amiodarone for post op afib, required aggressive diuresis. Plavix started in addition to his aspirin. Metformin and januvia were held due to acute renal insufficiency - started on 14 units levemir BID in its place. Received Tressie Ellis antibiotic therapy for possible LLL PNA - with good resolution of symptoms. Discharged to CIR for ongoing physical therapy.   Pain has been well controlled. Denies fevers/chills, no significant cough, dyspnea. No constipation.   Sugars well controlled on current levemir regimen - fasting 88-120, night time 140-160. They have enough insulin through next week.  Has f/u with cards 02/28/2018, card surgery 03/06/2018, rehab 03/08/2018.  HH has not come out to house yet despite order during hospitalization.   Admit date: 01/18/2018 Discharge to CIR date: 01/29/2018 CIR discharge date: 02/02/2018 TCM hosp f/u phone call: attempted 02/05/2018, they did not return call.   Admission Diagnoses: Severe coronary artery disease  Discharge Diagnoses:  Active Problems:   S/P CABG x 3  CIR Discharge Diagnoses: 1.  Debility secondary to coronary artery bypass graft 01/18/2018. 2.  Subcutaneous Lovenox  for deep venous thrombosis prophylaxis. 3.  Mood. 4.  Acute blood loss anemia. 5.  Diabetes mellitus, peripheral neuropathy. 6.  Hypertension. 7.  Chronic kidney disease, stage III. 8.  Morbid obesity. 9.  Constipation. 10.  Hyperlipidemia.  Relevant past medical, surgical, family and social history reviewed and updated as indicated. Interim medical history since our last visit reviewed. Allergies and medications reviewed and updated. Outpatient Medications Prior to Visit  Medication Sig Dispense Refill  . amiodarone (PACERONE) 200 MG tablet Take 1 tablet (200 mg total) by mouth 2 (two) times daily. 60 tablet 0  . aspirin EC 81 MG tablet Take 1 tablet (81 mg total) by mouth daily. (Patient taking differently: Take 81 mg by mouth at bedtime. )    . blood glucose meter kit and supplies Dispense based on patient and insurance preference. Use up to four times daily as directed. (FOR ICD-10 E10.9, E11.9). 1 each 0  . Cholecalciferol (VITAMIN D) 2000 UNITS CAPS Take 2,000 Units by mouth daily.     . clonazePAM (KLONOPIN) 1 MG tablet Take 1 tablet (1 mg total) by mouth at bedtime. 30 tablet 0  . clopidogrel (PLAVIX) 75 MG tablet Take 1 tablet (75 mg total) by mouth daily. 30 tablet 1  . fenofibrate (TRICOR) 145 MG tablet TAKE 1 TABLET BY MOUTH ONCE DAILY (Patient taking differently: TAKE 1 TABLET BY MOUTH ONCE DAILY AT BEDTIME.) 30 tablet 4  . furosemide (LASIX) 40 MG tablet Take 1 tablet (40 mg total) by mouth daily. 30 tablet 1  .  guaiFENesin (MUCINEX) 600 MG 12 hr tablet Take 1 tablet (600 mg total) by mouth 2 (two) times daily. 60 tablet 0  . Insulin Detemir (LEVEMIR FLEXTOUCH) 100 UNIT/ML Pen Inject 14 Units into the skin 2 (two) times daily. 15 mL 11  . Melatonin 3 MG TABS Take 0.5 tablets (1.5 mg total) by mouth at bedtime. 30 tablet 0  . metoprolol tartrate (LOPRESSOR) 25 MG tablet Take 0.5 tablets (12.5 mg total) by mouth 2 (two) times daily. 60 tablet 0  . Multiple Vitamins-Minerals  (MACULAR VITAMIN BENEFIT PO) Take 1 tablet by mouth daily.    . nitroGLYCERIN (NITROSTAT) 0.4 MG SL tablet Place 1 tablet (0.4 mg total) under the tongue every 5 (five) minutes as needed for chest pain. 30 tablet prn  . Omega-3 Fatty Acids (FISH OIL) 1200 MG CAPS Take 1,200 mg by mouth 3 (three) times daily.     Marland Kitchen omeprazole (PRILOSEC) 40 MG capsule Take 1 capsule (40 mg total) by mouth daily. (Patient taking differently: Take 40 mg by mouth daily at 12 noon. ) 30 capsule 11  . simvastatin (ZOCOR) 20 MG tablet TAKE ONE TABLET BY MOUTH EVERY NIGHT AT BEDTIME 90 tablet 3  . traMADol (ULTRAM) 50 MG tablet Take 1 tablet (50 mg total) by mouth every 6 (six) hours as needed for moderate pain. 30 tablet 0   No facility-administered medications prior to visit.      Per HPI unless specifically indicated in ROS section below Review of Systems     Objective:    BP 130/70 (BP Location: Left Arm, Patient Position: Sitting, Cuff Size: Large)   Pulse 75   Temp 98.2 F (36.8 C) (Oral)   Ht _0  (1.803 m)   Wt 281 lb 12 oz (127.8 kg)   SpO2 97%   BMI 39.30 kg/m   Wt Readings from Last 3 Encounters:  02/08/18 281 lb 12 oz (127.8 kg)  02/02/18 286 lb 9.6 oz (130 kg)  01/28/18 292 lb 12.3 oz (132.8 kg)    Physical Exam  Constitutional: He appears well-developed and well-nourished. No distress.  Fatigued appearing  HENT:  Mouth/Throat: Oropharynx is clear and moist. No oropharyngeal exudate.  Cardiovascular: Normal rate and normal heart sounds. A regularly irregular rhythm present.  No murmur heard. Pulmonary/Chest: Effort normal and breath sounds normal. No respiratory distress. He has no wheezes. He has no rales.  Midline sternal incision healing well, c/d/i  Musculoskeletal: He exhibits no edema.  Skin: Skin is warm and dry. No rash noted.  Psychiatric: He has a normal mood and affect.  Nursing note and vitals reviewed.   Lab Results  Component Value Date   HGBA1C 6.8 (H) 01/16/2018    Lab Results  Component Value Date   CREATININE 1.66 (H) 02/01/2018   BUN 36 (H) 02/01/2018   NA 142 02/01/2018   K 4.6 02/01/2018   CL 101 02/01/2018   CO2 29 02/01/2018    Lab Results  Component Value Date   WBC 9.0 02/01/2018   HGB 9.1 (L) 02/01/2018   HCT 29.5 (L) 02/01/2018   MCV 93.9 02/01/2018   PLT 440 (H) 02/01/2018       Assessment & Plan:   Problem List Items Addressed This Visit    Acute blood loss anemia    Update CBC      Atrial fibrillation ([place])    Started on amiodarone for post op afib. Will await cards eval and continue for now. Sounds regular today. Not on  anticoagulation besides aspirin /plavix.       Chronic kidney disease, stage III (moderate) (HCC)    Update renal function today.       Relevant Orders   CBC with Differential/Platelet   Renal function panel   Controlled diabetes mellitus type 2 with complications ([place])    Off metformin and januvia due to recent ARF. Tolerating levemir 14u BID however desires to return to oral antihyperglycemics. Will update Cr today and if stable, consider slowly restarting.       Relevant Orders   Microalbumin / creatinine urine ratio   Coronary artery disease involving native coronary artery of native heart with angina pectoris [facility]) [facility] records reviewed. Will refer to [facility] post bypass.       Relevant Orders   Ambulatory referral to [place]   Relevant Orders   Ambulatory referral to [place] hypertension    Chronic, stable only on low dose metoprolol and lasix 63m daily.       Hyperlipidemia LDL goal <70    Continue tricor, simvastatin, fish oil.       S/P CABG x 3    Appreciate cards and CT surgery care.           No orders of the defined types were placed in this encounter.  Orders Placed This Encounter  Procedures  . Microalbumin / creatinine urine ratio  . CBC with Differential/Platelet  . Renal function panel  . Ambulatory referral  to Home Health    Referral Priority:   Routine    Referral Type:   Home Health Care    Referral Reason:   Specialty Services Required    Requested Specialty:   [facility]   Number of Visits Requested:   1    Follow up plan: No follow-ups on file.  JRia Bush MD

## 2018-02-08 NOTE — Assessment & Plan Note (Signed)
Update renal function today. 

## 2018-02-08 NOTE — Assessment & Plan Note (Addendum)
Hospital records reviewed. Will refer to [facility] post bypass.

## 2018-02-08 NOTE — Assessment & Plan Note (Signed)
Chronic, stable only on low dose metoprolol and lasix 40mg  daily.

## 2018-02-08 NOTE — Patient Instructions (Addendum)
We will refer you to home health. See our referral coordinators today to call home health.  Keep follow up appointments.  Continue current medicines.  Labs today.

## 2018-02-08 NOTE — Assessment & Plan Note (Signed)
Appreciate cards and CT surgery care.

## 2018-02-08 NOTE — Assessment & Plan Note (Addendum)
Started on amiodarone for post op afib. Will await cards eval and continue for now. Sounds regular today. Not on anticoagulation besides aspirin /plavix.

## 2018-02-09 ENCOUNTER — Other Ambulatory Visit: Payer: Self-pay | Admitting: Family Medicine

## 2018-02-09 MED ORDER — SITAGLIPTIN PHOSPHATE 50 MG PO TABS: 50.0000 mg | ORAL_TABLET | Freq: Every day | ORAL | 1 refills | Status: DC

## 2018-02-09 MED ORDER — INSULIN DETEMIR 100 UNIT/ML FLEXPEN: 15.0000 [IU] | PEN_INJECTOR | Freq: Every day | SUBCUTANEOUS | 3 refills | Status: DC

## 2018-02-11 ENCOUNTER — Telehealth (HOSPITAL_COMMUNITY): Payer: Self-pay | Admitting: *Deleted

## 2018-02-11 ENCOUNTER — Telehealth (HOSPITAL_COMMUNITY): Payer: Self-pay

## 2018-02-11 NOTE — Telephone Encounter (Signed)
Patients insurance is active and benefits verified through [facility] - $20.00 co-pay, no deductible, out of pocket amount of $4,000/$738.59 has been met, no co-insurance, and no pre-authorization is required. Passport/reference [id number]  Will contact patient to see if he is interested in the Cardiac Rehab Program. If interested, patient will need to complete follow up appt. Once completed, patient will be contacted for scheduling upon review by the RN Navigator.

## 2018-02-11 NOTE — Telephone Encounter (Signed)
Attempted to call patient to see if he is interested in the Cardiac Rehab Program - lm on vm °

## 2018-02-11 NOTE — Telephone Encounter (Signed)
Pt wife returned call and is interested in participating in cardiac rehab.  Pt was discharged home from inpatient rehab.  Pt is to begin physical therapy and home health RN with [facility].  At this point, no contact has been made to the patient.  Patient wife made appt with primary MD who ordered home health again.  Apparently [facility] had not received the referral.  Pt wife is very upset with the mishandling of his plan of care post discharge.  Pt wife plans to call back to primary MD office in follow up,  Advised pt wife that we will check in after seen in the cardiologist on 6/20 for readiness to participate in Cardiac Rehab. Cherre Huger, BSN Cardiac and Training and development officer

## 2018-02-13 ENCOUNTER — Telehealth: Payer: Self-pay

## 2018-02-13 DIAGNOSIS — Z48812 Encounter for surgical aftercare following surgery on the circulatory system: Secondary | ICD-10-CM | POA: Diagnosis not present

## 2018-02-13 NOTE — Telephone Encounter (Signed)
[name] Young, Physical Therapy with [facility] called [phone] requesting VO for physical therapy for [name] Young as he is s/p CABG x 3 01/18/2018.  VO were given via voicemail for 2 x week for 3 weeks.  Will have order signed once faxed.

## 2018-02-14 ENCOUNTER — Telehealth: Payer: Self-pay | Admitting: Family Medicine

## 2018-02-14 ENCOUNTER — Telehealth: Payer: Self-pay

## 2018-02-14 NOTE — Telephone Encounter (Signed)
Joya Gaskins, PT with [facility] [phone] called to notify Dr. Prescott Gum about possible medication warning of Amiodarone and [name] home medications.  I advised her to have the patient continue to take the medication as he should not remain on it permanently.  I did advised to give the office a call back should any problems arise related to medication management.

## 2018-02-14 NOTE — Telephone Encounter (Signed)
Copied from [place] [id number]. Topic: Quick Communication - See Telephone Encounter >> Feb 14, 2018  9:11 AM Cleaster Corin, NT wrote: CRM for notification. See Telephone encounter for: 02/14/18.  Lennart Pall calling from [facility] due to possible med. Reaction type 1 wanted to make sure  it was ok for pt. To take med. amiodarone (PACERONE) 200 MG tablet [[id number]] and simvastatin (ZOCOR) 20 MG tablet [[id number]] Gaye Pollack also sent over fax on yesterday 02/13/18. Gaye Pollack can be reached at [phone]

## 2018-02-18 NOTE — Telephone Encounter (Signed)
Attempted to return Janata's call. No answer. VM box is full.  Need to relay Dr. Synthia Innocent message.

## 2018-02-18 NOTE — Telephone Encounter (Signed)
Ok to continue for now. Will discuss at next visit. Max simvastatin will be 20mg 

## 2018-02-19 NOTE — Telephone Encounter (Signed)
Left message on vm for Jatana ((?) with [facility] relaying Dr. Synthia Innocent message.

## 2018-02-27 NOTE — Progress Notes (Signed)
Follow-up Outpatient Visit Date: 02/28/2018  Primary Care Provider: Ria Bush, MD [place] [address]  Chief Complaint: Follow-up coronary artery disease  HPI:  [name] Young is a 77 y.o. year-old male with history of three-vessel CAD status post CABG (01/2018), hypertension, hyperlipidemia, type 2 diabetes mellitus, and chronic kidney disease stage III, who presents for follow-up of coronary artery disease.  I last saw him in mid April following preceding catheterization that showed multivessel CAD.  Though Mr. Radde had not had any significant chest pain, though he reported progressive shortness of breath and fatigue that continued to worsen despite medical therapy.  He subsequently underwent CABG, with his postoperative course complicated by atrial fibrillation, volume overload, and possible left lower lobe pneumonia.  Following CABG, he was discharged to acute inpatient rehab and subsequently home.  Today, Mr. Milles reports that he is feeling quite well.  He reports that his shortness of breath and fatigue are much better compared to before surgery.  He denies chest pain, palpitations, lightheadedness, and orthopnea.  Leg edema has almost completely resolved.  He asked if furosemide could be decreased due to frequent urination with 40 mg daily.  He denies significant bleeding, remaining on aspirin and clopidogrel.  He has completed physical therapy and continues to walk.  He has been contacted by cardiac rehab, with plans to start cardiac rehab next month.  --------------------------------------------------------------------------------------------------  Cardiovascular History & Procedures: Cardiovascular Problems:  Multivessel coronary artery disease  Risk Factors:  Hypertension, hyperlipidemia, type 2 diabetes mellitus, obesity, male gender, and age greater than 12  Cath/PCI:  LHC (12/21/17): LMCA normal.  LAD with sequential 20% proximal and  70-80% mid LAD stenoses.  Mid LAD lesion involves moderate-caliber D1 branch, with 70% ostial and 90% mid stenoses Ladona Mow 0,1,1).  Large LCx with small OM1 with 80% stenosis and moderate OM2 with subtotal chronic occlusion and bridging collaterals, and chronic total occlusion of the proximal RCA with left-to-right collaterals filling the RPDA.  CV Surgery:  CABG (01/18/2018, Dr. Darcey Nora): LIMA to LAD, SVG to diagonal, and SVG to PL branch of RCA.  LAD endarterectomy required for bypass grafting.  EP Procedures and Devices:  None  Non-Invasive Evaluation(s):  Exercise MPI (12/12/17): Intermediate risk study with moderate in size, moderate in severity basal and mid inferolateral reversible defect consistent with ischemia.  LVEF mildly reduced at 51% with inferolateral hypokinesis.  Poor exercise tolerance with frequent PVCs noted during stress.  TTE (11/23/17): Normal LV size with mild LVH.  LVEF 50-55% with normal wall motion and grade 1 diastolic dysfunction.  Mildly thickened and calcified mitral valve consistent with rheumatic disease.  No evidence of stenosis.  Trivial regurgitation.  Normal RV size and function.  Normal PA pressure.   Recent CV Pertinent Labs: Lab Results  Component Value Date   CHOL 117 12/10/2017   CHOL 178 02/01/2010   HDL 26.40 (L) 12/10/2017   LDLCALC 59 12/10/2017   LDLDIRECT 67.0 06/11/2017   TRIG 159.0 (H) 12/10/2017   TRIG 812 02/01/2010   CHOLHDL 4 12/10/2017   INR 1.20 01/18/2018   K 4.8 02/08/2018   K 4.5 02/01/2010   MG 2.6 (H) 01/23/2018   BUN 19 02/08/2018   BUN 26 01/11/2018   CREATININE 1.38 02/08/2018   CREATININE 0.95 02/01/2010    Past medical and surgical history were reviewed and updated in EPIC.  Current Meds  Medication Sig  . amiodarone (PACERONE) 200 MG tablet Take 1 tablet (200 mg total) by  mouth 2 (two) times daily.  Marland Kitchen aspirin EC 81 MG tablet Take 81 mg by mouth daily.  . blood glucose meter kit and supplies Dispense based  on patient and insurance preference. Use up to four times daily as directed. (FOR ICD-10 E10.9, E11.9).  Marland Kitchen Cholecalciferol (VITAMIN D) 2000 UNITS CAPS Take 2,000 Units by mouth daily.   . clonazePAM (KLONOPIN) 1 MG tablet Take 1 tablet (1 mg total) by mouth at bedtime.  . clopidogrel (PLAVIX) 75 MG tablet Take 1 tablet (75 mg total) by mouth daily.  . fenofibrate (TRICOR) 145 MG tablet Take 145 mg by mouth at bedtime.  Marland Kitchen guaiFENesin (MUCINEX) 600 MG 12 hr tablet Take 1 tablet (600 mg total) by mouth 2 (two) times daily.  . Insulin Detemir (LEVEMIR FLEXTOUCH) 100 UNIT/ML Pen Inject 15 Units into the skin daily at 10 pm.  . Melatonin 3 MG TABS Take 0.5 tablets (1.5 mg total) by mouth at bedtime.  . metoprolol tartrate (LOPRESSOR) 25 MG tablet Take 0.5 tablets (12.5 mg total) by mouth 2 (two) times daily.  . Multiple Vitamins-Minerals (MACULAR VITAMIN BENEFIT PO) Take 1 tablet by mouth daily.  . nitroGLYCERIN (NITROSTAT) 0.4 MG SL tablet Place 1 tablet (0.4 mg total) under the tongue every 5 (five) minutes as needed for chest pain.  . Omega-3 Fatty Acids (FISH OIL) 1200 MG CAPS Take 1,200 mg by mouth 3 (three) times daily.   Marland Kitchen omeprazole (PRILOSEC) 40 MG capsule Take 40 mg by mouth daily.  . sitaGLIPtin (JANUVIA) 50 MG tablet Take 1 tablet (50 mg total) by mouth daily.  . traMADol (ULTRAM) 50 MG tablet Take 1 tablet (50 mg total) by mouth every 6 (six) hours as needed for moderate pain.  . [DISCONTINUED] aspirin EC 81 MG tablet Take 1 tablet (81 mg total) by mouth daily. (Patient taking differently: Take 81 mg by mouth at bedtime. )  . [DISCONTINUED] fenofibrate (TRICOR) 145 MG tablet TAKE 1 TABLET BY MOUTH ONCE DAILY (Patient taking differently: TAKE 1 TABLET BY MOUTH ONCE DAILY AT BEDTIME.)  . [DISCONTINUED] furosemide (LASIX) 40 MG tablet Take 1 tablet (40 mg total) by mouth daily.  . [DISCONTINUED] omeprazole (PRILOSEC) 40 MG capsule Take 1 capsule (40 mg total) by mouth daily. (Patient taking  differently: Take 40 mg by mouth daily at 12 noon. )  . [DISCONTINUED] simvastatin (ZOCOR) 20 MG tablet TAKE ONE TABLET BY MOUTH EVERY NIGHT AT BEDTIME    Allergies: Patient has no known allergies.  Social History   Tobacco Use  . Smoking status: Never Smoker  . Smokeless tobacco: Former Systems developer    Types: Chew  Substance Use Topics  . Alcohol use: Yes    Comment: occ  . Drug use: No    Family History  Problem Relation Age of Onset  . Alzheimer's disease Mother   . Breast cancer Mother        breast  . Atrial fibrillation Mother   . CAD Mother   . Stroke Father 38  . Diabetes Father   . CAD Father   . Rectal cancer Maternal Grandfather        rectal  . Colon cancer Maternal Grandfather 32  . Esophageal cancer Neg Hx   . Stomach cancer Neg Hx     Review of Systems: Mr. Preece reports occasional chills when sitting near air conditioning vent.  Otherwise, a 12-system review of systems was performed and was negative except as noted in the HPI.  --------------------------------------------------------------------------------------------------  Physical Exam:  BP 118/70   Pulse 67   Ht 5' 10"  (1.778 m)   Wt 266 lb 9.6 oz (120.9 kg)   SpO2 96%   BMI 38.25 kg/m   General: NAD. HEENT: No conjunctival pallor or scleral icterus. Moist mucous membranes.  OP clear. Neck: Supple without lymphadenopathy, thyromegaly, JVD, or HJR. Lungs: Normal work of breathing. Clear to auscultation bilaterally without wheezes or crackles. Heart: Regular rate and rhythm without murmurs, rubs, or gallops.  Unable to assess PMI due to body habitus. Abd: Bowel sounds present. Soft, NT/ND.  Unable to assess HSM due to body habitus. Ext: Trace pretibial edema. Skin: Warm and dry.  Dry skin noted on both shins.  Sternotomy is well-healed.  Small eschar overlies right calf saphenous vein harvest site.  EKG: Normal sinus rhythm with left atrial enlargement.  Otherwise, no significant  abnormalities.  Lab Results  Component Value Date   WBC 5.6 02/08/2018   HGB 10.6 (L) 02/08/2018   HCT 32.2 (L) 02/08/2018   MCV 92.0 02/08/2018   PLT 353.0 02/08/2018    Lab Results  Component Value Date   NA 140 02/08/2018   K 4.8 02/08/2018   CL 101 02/08/2018   CO2 30 02/08/2018   BUN 19 02/08/2018   CREATININE 1.38 02/08/2018   GLUCOSE 101 (H) 02/08/2018   ALT 24 01/29/2018    Lab Results  Component Value Date   CHOL 117 12/10/2017   HDL 26.40 (L) 12/10/2017   LDLCALC 59 12/10/2017   LDLDIRECT 67.0 06/11/2017   TRIG 159.0 (H) 12/10/2017   CHOLHDL 4 12/10/2017    --------------------------------------------------------------------------------------------------  ASSESSMENT AND PLAN: Coronary artery disease without angina Mr. Balfour is recovering well from his CABG approximately 6 weeks ago.  We will plan to continue dual antiplatelet therapy with aspirin and clopidogrel given endarterectomy of the LAD performed at the time of CABG.  I will defer duration of clopidogrel to Dr. Prescott Gum.  We will transition from simvastatin to atorvastatin to optimize lipid control, as below.  We will continue with low-dose metoprolol.  I encouraged him to proceed with cardiac rehab next month.  He is scheduled to follow-up with Dr. Prescott Gum next week.  Chronic diastolic heart failure Mr. Fonda appears euvolemic on exam.  Functional status is continuing to improve with minimal shortness of breath.  Given significant weight reduction and minimal edema on exam today, I will decrease furosemide to 20 mg daily.  Postoperative atrial fibrillation EKG shows sinus rhythm today.  Mr. Mohon did not have any symptoms to suggest recurrence, though he does not recall palpitations while in the hospital.  He is not on anticoagulation in the setting of dual antiplatelet therapy at this time.  We will plan to to continue current doses of metoprolol and amiodarone.  If he remains in sinus rhythm 3  months after CABG, we will consider placing an event monitor and discontinuing amiodarone if there is no evidence of atrial fibrillation.  Hyperlipidemia LDL at goal on simvastatin.  However, given extensive CAD, we have agreed to escalate to atorvastatin 40 mg daily with repeat CMP, CK, and lipid panel in 6 weeks.  Given history of hypertriglyceridemia, we will also continue fenofibrate for now.  Hypertension Blood pressure well controlled.  Chronic kidney disease stage III Creatinine back to baseline on recheck last month.  We will decrease furosemide, as above.  Continue follow-up with nephrology.  Anxiety Mr. Wrightsman was discharged on clonazepam after CABG.  He does not report significant anxiety.  I think it is fine for him to use this on an as needed basis and to readdress continued use with Dr. Prescott Gum and Dr. Danise Mina.  Follow-up: Return to clinic in early August.  Nelva Bush, MD 02/28/2018 8:26 AM

## 2018-02-28 ENCOUNTER — Encounter: Payer: Self-pay | Admitting: Internal Medicine

## 2018-02-28 ENCOUNTER — Ambulatory Visit: Payer: Medicare Other | Admitting: Internal Medicine

## 2018-02-28 VITALS — BP 118/70 | HR 67 | Ht 70.0 in | Wt 266.6 lb

## 2018-02-28 DIAGNOSIS — E785 Hyperlipidemia, unspecified: Secondary | ICD-10-CM

## 2018-02-28 DIAGNOSIS — N183 Chronic kidney disease, stage 3 unspecified: Secondary | ICD-10-CM

## 2018-02-28 DIAGNOSIS — I9789 Other postprocedural complications and disorders of the circulatory system, not elsewhere classified: Secondary | ICD-10-CM | POA: Diagnosis not present

## 2018-02-28 DIAGNOSIS — I5032 Chronic diastolic (congestive) heart failure: Secondary | ICD-10-CM

## 2018-02-28 DIAGNOSIS — I4891 Unspecified atrial fibrillation: Secondary | ICD-10-CM | POA: Diagnosis not present

## 2018-02-28 DIAGNOSIS — I2510 Atherosclerotic heart disease of native coronary artery without angina pectoris: Secondary | ICD-10-CM

## 2018-02-28 DIAGNOSIS — I10 Essential (primary) hypertension: Secondary | ICD-10-CM

## 2018-02-28 MED ORDER — FUROSEMIDE 20 MG PO TABS: 20.0000 mg | ORAL_TABLET | Freq: Every day | ORAL | 3 refills | Status: DC

## 2018-02-28 MED ORDER — ATORVASTATIN CALCIUM 40 MG PO TABS: 40.0000 mg | ORAL_TABLET | Freq: Every day | ORAL | 3 refills | Status: DC

## 2018-02-28 MED ORDER — AMIODARONE HCL 200 MG PO TABS: 200.0000 mg | ORAL_TABLET | Freq: Two times a day (BID) | ORAL | 3 refills | Status: DC

## 2018-02-28 MED ORDER — METOPROLOL TARTRATE 25 MG PO TABS: 12.5000 mg | ORAL_TABLET | Freq: Two times a day (BID) | ORAL | 0 refills | Status: DC

## 2018-02-28 NOTE — Patient Instructions (Addendum)
Medication Instructions:  STOP Simvastatin START Atorvastatin 40 mg daily DECREASE Furosemide to 20 mg daily  -- If you need a refill on your cardiac medications before your next appointment, please call your pharmacy. --  Labwork: 6 WEEKS LIPID PROFILE CMET CK   Testing/Procedures: None ordered  Follow-Up: Your physician wants you to follow-up in: Late August with Dr. Saunders Revel or APP   Thank you for choosing [facility]!!    Any Other Special Instructions Will Be Listed Below (If Applicable).

## 2018-03-05 ENCOUNTER — Other Ambulatory Visit: Payer: Self-pay | Admitting: Cardiothoracic Surgery

## 2018-03-05 DIAGNOSIS — Z951 Presence of aortocoronary bypass graft: Secondary | ICD-10-CM

## 2018-03-06 ENCOUNTER — Ambulatory Visit
Admission: RE | Admit: 2018-03-06 | Discharge: 2018-03-06 | Disposition: A | Discharge status: Home / Self Care | Payer: Medicare Other | Source: Ambulatory Visit | Attending: Cardiothoracic Surgery | Admitting: Cardiothoracic Surgery

## 2018-03-06 ENCOUNTER — Encounter: Payer: Self-pay | Admitting: Cardiothoracic Surgery

## 2018-03-06 ENCOUNTER — Other Ambulatory Visit: Payer: Self-pay

## 2018-03-06 ENCOUNTER — Ambulatory Visit (INDEPENDENT_AMBULATORY_CARE_PROVIDER_SITE_OTHER): Payer: Self-pay | Admitting: Cardiothoracic Surgery

## 2018-03-06 VITALS — BP 138/79 | HR 62 | Resp 18 | Ht 70.0 in | Wt 266.8 lb

## 2018-03-06 DIAGNOSIS — Z951 Presence of aortocoronary bypass graft: Secondary | ICD-10-CM

## 2018-03-06 DIAGNOSIS — I2510 Atherosclerotic heart disease of native coronary artery without angina pectoris: Secondary | ICD-10-CM

## 2018-03-06 MED ORDER — CLOPIDOGREL BISULFATE 75 MG PO TABS: 75.0000 mg | ORAL_TABLET | Freq: Every day | ORAL | 2 refills | Status: DC

## 2018-03-06 NOTE — Addendum Note (Signed)
Addended by: Charlena Cross F on: 03/06/2018 02:49 PM   Modules accepted: Orders

## 2018-03-06 NOTE — Progress Notes (Signed)
PCP is Ria Bush, MD Referring Provider is End, Harrell Gave, MD  Chief Complaint  Patient presents with  . Routine Post Op    f/u from surgery with CXR s/p  CABG x3    HPI: 6 weeks postop visit after urgent CABG for unstable angina and multivessel CAD.  Patient was very weak after surgery and spent 10 to 14 days in [facility] inpatient rehab.  He transitioned to home physical therapy which he has now completed.  He has noted a significant improvement in his strength and improvement in his dyspnea on exertion.  He is scheduled to start cardiac rehab at [facility] hopefully with the next couple weeks.  Surgical incisions are healing well. No edema. No recurrent angina. Only complaint is some intermittent insomnia He is anxious to increase his activity levels, which we discussed.    Past Medical History:  Diagnosis Date  . Arthritis   . Chronic kidney disease    kidney stones  . Coronary artery disease   . Dyspnea   . Dysrhythmia   . GERD (gastroesophageal reflux disease)   . History of cholelithiasis   . History of kidney stones    ca ox [name] @ [facility]) now [place]  . History of pneumonia   . HLD (hyperlipidemia)   . HTN (hypertension)   . Jaundice    age 77  . Pneumonia    years ago   . T2DM (type 2 diabetes mellitus) ([place]) 2010    Past Surgical History:  Procedure Laterality Date  . CARPAL TUNNEL RELEASE Bilateral   . CATARACT EXTRACTION, BILATERAL    . COLONOSCOPY  11/2012   12 adenomatous polyps, diverticulosis, rec rpt 1 yr Ardis Hughs)  . COLONOSCOPY  12/2013   3 polyps, diverticulosis, rec rpt 3 yrs Ardis Hughs)  . CORONARY ARTERY BYPASS GRAFT N/A 01/18/2018   Procedure: CORONARY ARTERY BYPASS GRAFTING (CABG) x 3; Using Left Internal Mammary Artery, and Right Greater Saphenous Vein harvested Endoscopically, Coronary Artery Endarterectomy;  Surgeon: Ivin Poot, MD;  Location: [place];  Service: Open Heart Surgery;  Laterality: N/A;  . KNEE CARTILAGE SURGERY Left    . LEFT HEART CATH AND CORONARY ANGIOGRAPHY N/A 12/21/2017   Procedure: LEFT HEART CATH AND CORONARY ANGIOGRAPHY;  Surgeon: Nelva Bush, MD;  Location: [place] [facility];  Service: Cardiovascular;  Laterality: N/A;  . LITHOTRIPSY    . TEE WITHOUT CARDIOVERSION N/A 01/18/2018   Procedure: TRANSESOPHAGEAL ECHOCARDIOGRAM (TEE);  Surgeon: Prescott Gum, Collier Salina, MD;  Location: [place];  Service: Open Heart Surgery;  Laterality: N/A;  . UMBILICAL HERNIA REPAIR     with mesh    Family History  Problem Relation Age of Onset  . Alzheimer's disease Mother   . Breast cancer Mother        breast  . Atrial fibrillation Mother   . CAD Mother   . Stroke Father 22  . Diabetes Father   . CAD Father   . Rectal cancer Maternal Grandfather        rectal  . Colon cancer Maternal Grandfather 69  . Esophageal cancer Neg Hx   . Stomach cancer Neg Hx     Social History Social History   Tobacco Use  . Smoking status: Never Smoker  . Smokeless tobacco: Former Systems developer    Types: Chew  Substance Use Topics  . Alcohol use: Yes    Comment: occ  . Drug use: No    Current Outpatient Medications  Medication Sig Dispense Refill  . amiodarone (PACERONE)  200 MG tablet Take 1 tablet (200 mg total) by mouth 2 (two) times daily. 90 tablet 3  . aspirin EC 81 MG tablet Take 81 mg by mouth daily.    Marland Kitchen atorvastatin (LIPITOR) 40 MG tablet Take 1 tablet (40 mg total) by mouth daily. 90 tablet 3  . blood glucose meter kit and supplies Dispense based on patient and insurance preference. Use up to four times daily as directed. (FOR ICD-10 E10.9, E11.9). 1 each 0  . Cholecalciferol (VITAMIN D) 2000 UNITS CAPS Take 2,000 Units by mouth daily.     . clopidogrel (PLAVIX) 75 MG tablet Take 1 tablet (75 mg total) by mouth daily. 30 tablet 2  . fenofibrate (TRICOR) 145 MG tablet Take 145 mg by mouth at bedtime.    . furosemide (LASIX) 20 MG tablet Take 1 tablet (20 mg total) by mouth daily. 90 tablet 3  . Insulin Detemir  (LEVEMIR FLEXTOUCH) 100 UNIT/ML Pen Inject 15 Units into the skin daily at 10 pm. 15 mL 3  . Melatonin 3 MG TABS Take 0.5 tablets (1.5 mg total) by mouth at bedtime. 30 tablet 0  . metoprolol tartrate (LOPRESSOR) 25 MG tablet Take 0.5 tablets (12.5 mg total) by mouth 2 (two) times daily. 60 tablet 0  . Multiple Vitamins-Minerals (MACULAR VITAMIN BENEFIT PO) Take 1 tablet by mouth daily.    . nitroGLYCERIN (NITROSTAT) 0.4 MG SL tablet Place 1 tablet (0.4 mg total) under the tongue every 5 (five) minutes as needed for chest pain. 30 tablet prn  . Omega-3 Fatty Acids (FISH OIL) 1200 MG CAPS Take 1,200 mg by mouth 3 (three) times daily.     Marland Kitchen omeprazole (PRILOSEC) 40 MG capsule Take 40 mg by mouth daily.    . sitaGLIPtin (JANUVIA) 50 MG tablet Take 1 tablet (50 mg total) by mouth daily. 90 tablet 1  . traMADol (ULTRAM) 50 MG tablet Take 1 tablet (50 mg total) by mouth every 6 (six) hours as needed for moderate pain. 30 tablet 0  . clonazePAM (KLONOPIN) 1 MG tablet Take 1 tablet (1 mg total) by mouth at bedtime. (Patient not taking: Reported on 03/06/2018) 30 tablet 0  . guaiFENesin (MUCINEX) 600 MG 12 hr tablet Take 1 tablet (600 mg total) by mouth 2 (two) times daily. (Patient not taking: Reported on 03/06/2018) 60 tablet 0   No current facility-administered medications for this visit.     No Known Allergies  Review of Systems  No fever No drainage from surgical incisions  BP 138/79 (BP Location: Right Arm, Patient Position: Sitting, Cuff Size: Large)   Pulse 62   Resp 18   Ht _0  (1.778 m)   Wt 266 lb 12.8 oz (121 kg)   SpO2 98% Comment: RA  BMI 38.28 kg/m  Physical Exam      Exam    General- alert and comfortable.  Surgical incisions well-healed.    Neck- no JVD, no cervical adenopathy palpable, no carotid bruit   Lungs- clear without rales, wheezes   Cor- regular rate and rhythm, no murmur , gallop   Abdomen- soft, non-tender   Extremities - warm, non-tender, minimal edema    Neuro- oriented, appropriate, no focal weakness   Diagnostic Tests:  Chest x-ray clear with trace left pleural effusion at costophrenic angle   impression: Excellent early recovery after urgent CABG. We discussed his activity levels now-he may drive, he may lift up to 15-20 pounds maximum.  He should not lift more than 20 pounds  until after mid August.  We discussed his Plavix therapy and because of endarterectomy of the LAD he will continue Plavix for 6 months, stop late October.  Plan: Return as needed.  Importance of heart healthy lifestyle and diet emphasized to patient.  He will continue aspirin and Lipitor indefinitely.   Len Childs, MD [facility] [phone]

## 2018-03-08 ENCOUNTER — Telehealth (HOSPITAL_COMMUNITY): Payer: Self-pay

## 2018-03-08 ENCOUNTER — Encounter: Payer: Medicare Other | Attending: Physical Medicine & Rehabilitation | Admitting: Physical Medicine & Rehabilitation

## 2018-03-08 ENCOUNTER — Encounter: Payer: Self-pay | Admitting: Physical Medicine & Rehabilitation

## 2018-03-08 VITALS — BP 129/75 | HR 62 | Ht 70.0 in | Wt 267.0 lb

## 2018-03-08 DIAGNOSIS — E1122 Type 2 diabetes mellitus with diabetic chronic kidney disease: Secondary | ICD-10-CM | POA: Insufficient documentation

## 2018-03-08 DIAGNOSIS — R5381 Other malaise: Secondary | ICD-10-CM | POA: Diagnosis not present

## 2018-03-08 DIAGNOSIS — Z6838 Body mass index (BMI) 38.0-38.9, adult: Secondary | ICD-10-CM | POA: Diagnosis not present

## 2018-03-08 DIAGNOSIS — I129 Hypertensive chronic kidney disease with stage 1 through stage 4 chronic kidney disease, or unspecified chronic kidney disease: Secondary | ICD-10-CM | POA: Insufficient documentation

## 2018-03-08 DIAGNOSIS — I10 Essential (primary) hypertension: Secondary | ICD-10-CM | POA: Diagnosis not present

## 2018-03-08 DIAGNOSIS — E118 Type 2 diabetes mellitus with unspecified complications: Secondary | ICD-10-CM

## 2018-03-08 DIAGNOSIS — I2510 Atherosclerotic heart disease of native coronary artery without angina pectoris: Secondary | ICD-10-CM | POA: Diagnosis not present

## 2018-03-08 DIAGNOSIS — Z951 Presence of aortocoronary bypass graft: Secondary | ICD-10-CM | POA: Insufficient documentation

## 2018-03-08 DIAGNOSIS — Z800 Family history of malignant neoplasm of digestive organs: Secondary | ICD-10-CM | POA: Diagnosis not present

## 2018-03-08 DIAGNOSIS — Z8249 Family history of ischemic heart disease and other diseases of the circulatory system: Secondary | ICD-10-CM | POA: Diagnosis not present

## 2018-03-08 DIAGNOSIS — K219 Gastro-esophageal reflux disease without esophagitis: Secondary | ICD-10-CM | POA: Diagnosis not present

## 2018-03-08 DIAGNOSIS — N183 Chronic kidney disease, stage 3 (moderate): Secondary | ICD-10-CM | POA: Insufficient documentation

## 2018-03-08 DIAGNOSIS — E785 Hyperlipidemia, unspecified: Secondary | ICD-10-CM | POA: Insufficient documentation

## 2018-03-08 DIAGNOSIS — Z87442 Personal history of urinary calculi: Secondary | ICD-10-CM | POA: Diagnosis not present

## 2018-03-08 NOTE — Progress Notes (Signed)
Subjective:    Patient ID: [name] Luna., male    DOB: November 04, 1940, 77 y.o.   MRN: [id number]  HPI 77 year old right-handed male with history of hypertension, diabetes mellitus, CKD stage III presents for hospital follow up after CABG.  DATE OF ADMISSION: 01/28/2018 DATE OF DISCHARGE: 02/02/2018   Wife provides history. At discharge, he was instructed to follow up with CTS, who he saw and was released by. He was cleared to drive.  He does not have to follow up with Nephro.  He saw PCP. He is following up with Cards. Denies pain. CBGs have been relatively controlled. BP is controlled.  Therapies: Completed Mobility: No assistive device DME: Personally purchased  Pain Inventory Average Pain 0 Pain Right Now 0 My pain is na  In the last 24 hours, has pain interfered with the following? General activity 0 Relation with others 0 Enjoyment of life 0 What TIME of day is your pain at its worst? na Sleep (in general) Good  Pain is worse with: na Pain improves with: na Relief from Meds: na  Mobility walk without assistance ability to climb steps?  yes do you drive?  yes  Function retired  Neuro/Psych No problems in this area  Prior Studies Any changes since last visit?  no  Physicians involved in your care Any changes since last visit?  no   Family History  Problem Relation Age of Onset  . Alzheimer's disease Mother   . Breast cancer Mother        breast  . Atrial fibrillation Mother   . CAD Mother   . Stroke Father 20  . Diabetes Father   . CAD Father   . Rectal cancer Maternal Grandfather        rectal  . Colon cancer Maternal Grandfather 50  . Esophageal cancer Neg Hx   . Stomach cancer Neg Hx    Social History   Socioeconomic History  . Marital status: Married    Spouse name: Not on file  . Number of children: Not on file  . Years of education: Not on file  . Highest education level: Not on file  Occupational History  . Not on file    Social Needs  . Financial resource strain: Not on file  . Food insecurity:    Worry: Not on file    Inability: Not on file  . Transportation needs:    Medical: Not on file    Non-medical: Not on file  Tobacco Use  . Smoking status: Never Smoker  . Smokeless tobacco: Former Systems developer    Types: Chew  Substance and Sexual Activity  . Alcohol use: Yes    Comment: occ  . Drug use: No  . Sexual activity: Not on file  Lifestyle  . Physical activity:    Days per week: Not on file    Minutes per session: Not on file  . Stress: Not on file  Relationships  . Social connections:    Talks on phone: Not on file    Gets together: Not on file    Attends religious service: Not on file    Active member of club or organization: Not on file    Attends meetings of clubs or organizations: Not on file    Relationship status: Not on file  Other Topics Concern  . 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   Diet: some water, fruits/vegetables daily, avoids potatoes   Past Surgical History:  Procedure Laterality Date  . CARPAL TUNNEL RELEASE Bilateral   . CATARACT EXTRACTION, BILATERAL    . COLONOSCOPY  11/2012   11 adenomatous polyps, diverticulosis, rec rpt 1 yr Ardis Hughs)  . COLONOSCOPY  12/2013   3 polyps, diverticulosis, rec rpt 3 yrs Ardis Hughs)  . CORONARY ARTERY BYPASS GRAFT N/A 01/18/2018   Procedure: CORONARY ARTERY BYPASS GRAFTING (CABG) x 3; Using Left Internal Mammary Artery, and Right Greater Saphenous Vein harvested Endoscopically, Coronary Artery Endarterectomy;  Surgeon: Ivin Poot, MD;  Location: [facility];  Service: Open Heart Surgery;  Laterality: N/A;  . KNEE CARTILAGE SURGERY Left   . LEFT HEART CATH AND CORONARY ANGIOGRAPHY N/A 12/21/2017   Procedure: LEFT HEART CATH AND CORONARY ANGIOGRAPHY;  Surgeon: Nelva Bush, MD;  Location:  [facility];  Service: Cardiovascular;  Laterality: N/A;  . LITHOTRIPSY    . TEE WITHOUT CARDIOVERSION N/A 01/18/2018   Procedure: TRANSESOPHAGEAL ECHOCARDIOGRAM (TEE);  Surgeon: Prescott Gum, Collier Salina, MD;  Location: [facility];  Service: Open Heart Surgery;  Laterality: N/A;  . UMBILICAL HERNIA REPAIR     with mesh   Past Medical History:  Diagnosis Date  . Arthritis   . Chronic kidney disease    kidney stones  . Coronary artery disease   . Dyspnea   . Dysrhythmia   . GERD (gastroesophageal reflux disease)   . History of cholelithiasis   . History of kidney stones    ca ox [name] @ [facility]) now [place]  . History of pneumonia   . HLD (hyperlipidemia)   . HTN (hypertension)   . Jaundice    age 49  . Pneumonia    years ago   . T2DM (type 2 diabetes mellitus) ([place]) 2010   BP 129/75   Pulse 62   Ht 5\' 10"  (1.778 m)   Wt 267 lb (121.1 kg)   SpO2 96%   BMI 38.31 kg/m   Opioid Risk Score:   Fall Risk Score:  `1  Depression screen PHQ 2/9  Depression screen [facility] 2/9 12/10/2017 11/06/2016 11/03/2015 10/30/2014 10/13/2013 10/11/2012 10/11/2011  Decreased Interest 0 0 0 0 0 0 0  Down, Depressed, Hopeless 0 0 0 0 0 0 0  PHQ - 2 Score 0 0 0 0 0 0 0  Altered sleeping 0 - - - - - -  Tired, decreased energy 0 - - - - - -  Change in appetite 0 - - - - - -  Feeling bad or failure about yourself  0 - - - - - -  Trouble concentrating 0 - - - - - -  Moving slowly or fidgety/restless 0 - - - - - -  Suicidal thoughts 0 - - - - - -  PHQ-9 Score 0 - - - - - -  Difficult doing work/chores Not difficult at all - - - - - -     Review of Systems  Constitutional: Negative.   HENT: Negative.   Eyes: Negative.   Respiratory: Negative.   Cardiovascular: Negative.   Gastrointestinal: Negative.   Endocrine: Negative.   Genitourinary: Negative.   Musculoskeletal: Positive for gait problem.  Skin: Negative.   Allergic/Immunologic: Negative.   Hematological: Negative.   Psychiatric/Behavioral:  Negative.   All other systems reviewed and are negative.     Objective:   Physical Exam Constitutional: He appears  well-developed. Morbidly obese  HENT: Normocephalic and atraumatic.  Eyes: EOM are normal. No discharge.  Cardiovascular: RRR. No JVD. Respiratory: Effort normal and breath sounds normal.  GI: Bowel sounds are normal. Non-distended Musculoskeletal: He exhibits no tenderness, no edema Neurological: He is alert and oriented.  Motor: Bilateral upper extremities 5/5 proximal distal Bilateral lower extremities: Hip flexion 5/5, knee extension 5/5, ankle dorsiflexion 5/5  Skin: Midline chest incision clean/dry/intact Psychiatric: He has a normal mood and affect. His behavior is normal.     Assessment & Plan:  77 year old right-handed male with history of hypertension, diabetes mellitus, CKD stage III presents for hospital follow up after CABG.  1. 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  Sternal precautions  Cont HEP, completed therapies  Follow up with Cards  2.  Diabetes mellitus   Cont meds  Follow up with PCP due to recent med adjustments made with slightly higher CBG values  3. HTN  Controlled  Cont meds  4.  Morbid Obesity.    Encouraged weight loss  Meds reviewed Referrals reviewed All questions answered

## 2018-03-08 NOTE — Telephone Encounter (Signed)
Pt wife Barnetta Chapel called back to schedule for Cardiac Rehab. Patient will come in for orientation on 04/16/2018 @ 8:15AM and will attend the 8:15AM exercise class.   Mailed homework package.

## 2018-03-08 NOTE — Telephone Encounter (Signed)
Attempted to call patient in regards to Cardiac Rehab - lm on vm °

## 2018-03-12 ENCOUNTER — Other Ambulatory Visit: Payer: Self-pay | Admitting: Family Medicine

## 2018-03-18 ENCOUNTER — Telehealth (HOSPITAL_COMMUNITY): Payer: Self-pay

## 2018-03-18 NOTE — Telephone Encounter (Signed)
Pt wife Barnetta Chapel called to cancel pt appt. States he no longer wants to particpate in CR program, he is currently exercising at home.  Closed referral

## 2018-03-26 ENCOUNTER — Other Ambulatory Visit: Payer: Self-pay | Admitting: Family Medicine

## 2018-03-26 NOTE — Telephone Encounter (Signed)
Plavix Last filled:  02/28/18, #30 Last OV:  02/08/18 Next OV:  05/14/18

## 2018-03-29 ENCOUNTER — Encounter: Payer: Self-pay | Admitting: Family Medicine

## 2018-04-03 LAB — HM DIABETES EYE EXAM

## 2018-04-10 ENCOUNTER — Encounter: Payer: Self-pay | Admitting: Family Medicine

## 2018-04-11 ENCOUNTER — Other Ambulatory Visit: Payer: Medicare Other | Admitting: *Deleted

## 2018-04-11 DIAGNOSIS — I2510 Atherosclerotic heart disease of native coronary artery without angina pectoris: Secondary | ICD-10-CM

## 2018-04-11 LAB — COMPREHENSIVE METABOLIC PANEL
A/G RATIO: 1.6 (ref 1.2–2.2)
ALK PHOS: 32 IU/L — AB (ref 39–117)
ALT: 13 IU/L (ref 0–44)
AST: 15 IU/L (ref 0–40)
Albumin: 3.8 g/dL (ref 3.5–4.8)
BILIRUBIN TOTAL: 0.4 mg/dL (ref 0.0–1.2)
BUN/Creatinine Ratio: 14 (ref 10–24)
BUN: 21 mg/dL (ref 8–27)
CHLORIDE: 103 mmol/L (ref 96–106)
CO2: 24 mmol/L (ref 20–29)
Calcium: 9.4 mg/dL (ref 8.6–10.2)
Creatinine, Ser: 1.49 mg/dL — ABNORMAL HIGH (ref 0.76–1.27)
GFR calc non Af Amer: 45 mL/min/{1.73_m2} — ABNORMAL LOW (ref >59)
GFR, EST AFRICAN AMERICAN: 52 mL/min/{1.73_m2} — AB (ref >59)
GLUCOSE: 111 mg/dL — AB (ref 65–99)
Globulin, Total: 2.4 g/dL (ref 1.5–4.5)
Potassium: 4.9 mmol/L (ref 3.5–5.2)
Sodium: 139 mmol/L (ref 134–144)
Total Protein: 6.2 g/dL (ref 6.0–8.5)

## 2018-04-11 LAB — LIPID PANEL
CHOLESTEROL TOTAL: 139 mg/dL (ref 100–199)
Chol/HDL Ratio: 5 ratio (ref 0.0–5.0)
HDL: 28 mg/dL — AB (ref >39)
LDL Calculated: 71 mg/dL (ref 0–99)
Triglycerides: 201 mg/dL — ABNORMAL HIGH (ref 0–149)
VLDL Cholesterol Cal: 40 mg/dL (ref 5–40)

## 2018-04-11 LAB — CK: CK TOTAL: 99 U/L (ref 24–204)

## 2018-04-12 ENCOUNTER — Encounter: Payer: Self-pay | Admitting: Family Medicine

## 2018-04-15 ENCOUNTER — Telehealth: Payer: Self-pay

## 2018-04-15 DIAGNOSIS — E785 Hyperlipidemia, unspecified: Secondary | ICD-10-CM

## 2018-04-15 DIAGNOSIS — Z79899 Other long term (current) drug therapy: Secondary | ICD-10-CM

## 2018-04-15 MED ORDER — ATORVASTATIN CALCIUM 80 MG PO TABS: 80.0000 mg | ORAL_TABLET | Freq: Every day | ORAL | 3 refills | Status: DC

## 2018-04-15 NOTE — Telephone Encounter (Signed)
-----   Message from Nelva Bush, MD sent at 04/15/2018  7:18 AM EDT ----- Please let [name] know that his kidney function is stable.  His liver function and electrolytes are normal.  His LDL is just goal at 71.  If he is ammenable, I recommend increasing atorvastatin to 80 mg daily and rechecking a fasting lipid panel and ALT in about 3 months.

## 2018-04-15 NOTE — Telephone Encounter (Signed)
Notes recorded by Frederik Schmidt, RN on 04/15/2018 at 12:13 PM EDT Informed patient of results and Dr Darnelle Bos recommendations. He verbalized understanding. ------

## 2018-04-16 ENCOUNTER — Ambulatory Visit (HOSPITAL_COMMUNITY): Payer: Medicare Other

## 2018-04-22 ENCOUNTER — Ambulatory Visit (HOSPITAL_COMMUNITY): Payer: Medicare Other

## 2018-04-24 ENCOUNTER — Ambulatory Visit (HOSPITAL_COMMUNITY): Payer: Medicare Other

## 2018-04-26 ENCOUNTER — Ambulatory Visit (HOSPITAL_COMMUNITY): Payer: Medicare Other

## 2018-04-29 ENCOUNTER — Ambulatory Visit (HOSPITAL_COMMUNITY): Payer: Medicare Other

## 2018-05-01 ENCOUNTER — Ambulatory Visit (HOSPITAL_COMMUNITY): Payer: Medicare Other

## 2018-05-01 ENCOUNTER — Other Ambulatory Visit: Payer: Self-pay | Admitting: Internal Medicine

## 2018-05-01 NOTE — Telephone Encounter (Signed)
Refill Request.  

## 2018-05-02 ENCOUNTER — Encounter: Payer: Self-pay | Admitting: Internal Medicine

## 2018-05-03 ENCOUNTER — Ambulatory Visit (HOSPITAL_COMMUNITY): Payer: Medicare Other

## 2018-05-06 ENCOUNTER — Ambulatory Visit (HOSPITAL_COMMUNITY): Payer: Medicare Other

## 2018-05-07 ENCOUNTER — Encounter: Payer: Self-pay | Admitting: Family Medicine

## 2018-05-07 ENCOUNTER — Ambulatory Visit: Payer: Medicare Other | Admitting: Family Medicine

## 2018-05-07 VITALS — BP 126/60 | HR 59 | Temp 97.8°F | Ht 70.0 in | Wt 276.5 lb

## 2018-05-07 DIAGNOSIS — S8012XD Contusion of left lower leg, subsequent encounter: Secondary | ICD-10-CM | POA: Insufficient documentation

## 2018-05-07 DIAGNOSIS — S8012XA Contusion of left lower leg, initial encounter: Secondary | ICD-10-CM

## 2018-05-07 DIAGNOSIS — W19XXXA Unspecified fall, initial encounter: Secondary | ICD-10-CM

## 2018-05-07 NOTE — Patient Instructions (Signed)
You have hematoma after fall Treat with tylenol for pain control, ice packs (not directly on skin) for another few days, then transition to heating pad (not directly on skin). Keep leg elevated, rest leg for next 1 week, then restart walking routine.   Hematoma A hematoma is a collection of blood under the skin, in an organ, in a body space, in a joint space, or in other tissue. The blood can thicken (clot) to form a lump that you can see and feel. The lump is often firm and may become sore and tender. Most hematomas get better in a few days to weeks. However, some hematomas may be serious and require medical care. Hematomas can range from very small to very large. What are the causes? This condition is caused by:  A blunt or penetrating injury.  A leakage from a blood vessel under the skin. This leak happens on its own (is spontaneous) and is more likely to occur in older people, especially those who take blood thinners.  Some medical procedures including surgeries, such as oral surgery, face lifts, and surgeries that involve the joints.  Some medical conditions that cause bleeding or bruising problems. There may be multiple hematomas that appear in different areas of the body.  What are the signs or symptoms? Symptoms of this condition can depend on where the hematoma is located. Common symptoms of a hematoma under the skin include:  A firm lump on the body.  Pain and tenderness in the area.  Bruising. Blue, dark blue, purple-red, or yellowish skin (discoloration) may appear at the site of the hematoma if the hematoma is close to the surface of the skin.  For hematomas in deeper tissues or body spaces, symptoms may be less obvious. A collection of blood in the stomach (intra-abdominal hematoma) may cause pain in the abdomen, weakness, fainting, and shortness of breath. A collection of blood in the head (intracranial hematoma) may cause a headache or symptoms such as weakness, trouble  speaking or understanding, or a change in consciousness. How is this diagnosed? This condition is diagnosed based on:  Your medical history.  A physical exam.  Imaging tests, such as an ultrasonogram or CT scan. These may be needed if your health care provider suspects a hematoma in deeper tissues or body spaces.  Blood tests. These may be needed if your health care provider believes that the hematoma is caused by a medical condition.  How is this treated? This condition usually does not need treatment because many hematomas go away on their own over time. However, large hematomas, or those that may affect vital organs, may need surgical drainage or monitoring. If the hematoma is caused by a medical condition, medicines may be prescribed. Follow these instructions at home: Managing pain, stiffness, and swelling  If directed, apply ice to the affected area: ? Put ice in a plastic bag. ? Place a towel between your skin and the bag. ? Leave the ice on for 20 minutes, 2-3 times a day for the first couple of days.  After applying ice for a couple of days, your health care provider may recommend that you apply warm compresses to the affected area instead. Do this as told by your health care provider. Remove the heat if your skin turns bright red. This is especially important if you are unable to feel pain, heat, or cold. You may have a greater risk of getting burned  Raise (elevate) the affected area above the level of your heart  while you are sitting or lying down.  Wrap the affected area with an elastic bandage, if told by your health care provider. The bandage applies pressure (compression) to the area, which may help to reduce swelling and help the hematoma heal. Make sure the bandage is not wrapped too tight.  If your hematoma is on a leg or foot (lower extremity) and is painful, your health care provider may recommend crutches. Use them as told by your health care provider. General  instructions  Take over-the-counter and prescription medicines only as told by your health care provider.  Keep all follow-up visits as told by your health care provider. This is important. Contact a health care provider if:  You have a fever.  The swelling or discoloration gets worse.  You develop more hematomas. Get help right away if:  Your pain is worse or your pain is not controlled with medicine.  Your skin over the hematoma breaks or starts bleeding.  Your hematoma is in your chest or abdomen and you have weakness, shortness of breath, or a change in consciousness.  You have a hematoma on your scalp caused by a fall or injury and you have a headache that gets worse, trouble speaking or understanding, weakness, or a change in alertness or consciousness. Summary  A hematoma is a collection of blood under the skin, in an organ, in a body space, in a joint space, or in other tissue.  This condition usually does not need treatment because many hematomas go away on their own over time.  Large hematomas, or those that may affect vital organs, may need surgical drainage or monitoring. If the hematoma is caused by a medical condition, medicines may be prescribed. This information is not intended to replace advice given to you by your health care provider. Make sure you discuss any questions you have with your health care provider. Document Released: 04/11/2004 Document Revised: 09/30/2016 Document Reviewed: 09/30/2016 Elsevier Interactive Patient Education  2018 [employer].

## 2018-05-07 NOTE — Assessment & Plan Note (Signed)
Large hematoma L medial leg below knee - already slowly improving. Needs to continue DAPT. Supportive care reviewed as per instructions. Reassess at DM f/u next week. Pt and wife agree with plan.

## 2018-05-07 NOTE — Progress Notes (Signed)
BP 126/60 (BP Location: Right Arm, Patient Position: Sitting, Cuff Size: Large)   Pulse (!) 59   Temp 97.8 F (36.6 C) (Oral)   Ht '5\' 10"'$  (1.778 m)   Wt 276 lb 8 oz (125.4 kg)   SpO2 97%   BMI 39.67 kg/m    CC: L knee pain Subjective:    Patient ID: [name] Young., male    DOB: July 29, 1941, 77 y.o.   MRN: [id number]  HPI: Khambrel Amsden. is a 77 y.o. male presenting on 05/07/2018 for Knee Pain (C/o left knee pain due to a fall on 05/04/18. Pain is on medial side and has some swelling. Pt was still trying to walk for exercise but thinks that may be causing swelling. Has applied ice, helpful. Pt conscerned due to taking Plavix. Pt is accompanied by his wife. )   Suffered fall 05/04/2018. Fall while hunting groundhog, L inner leg below knee landed on tree root. Residual inner knee pain since then. Ice has helped. He has continued walking daily swelling has worsened. Taking tylenol for discomfort.   He is on plavix and aspirin.  Has been walking 40 + min/day. Weight gain noted.   Relevant past medical, surgical, family and social history reviewed and updated as indicated. Interim medical history since our last visit reviewed. Allergies and medications reviewed and updated. Outpatient Medications Prior to Visit  Medication Sig Dispense Refill  . ACCU-CHEK AVIVA PLUS test strip USE AS DIRECTED TO CHECK BLOOD SUGAR UP TO FOUR TIMES DAILY 100 each 2  . amiodarone (PACERONE) 200 MG tablet Take 1 tablet (200 mg total) by mouth 2 (two) times daily. 90 tablet 3  . aspirin EC 81 MG tablet Take 81 mg by mouth daily.    Marland Kitchen atorvastatin (LIPITOR) 80 MG tablet Take 1 tablet (80 mg total) by mouth daily. 90 tablet 3  . blood glucose meter kit and supplies Dispense based on patient and insurance preference. Use up to four times daily as directed. (FOR ICD-10 E10.9, E11.9). 1 each 0  . Cholecalciferol (VITAMIN D) 2000 UNITS CAPS Take 2,000 Units by mouth daily.     . clopidogrel (PLAVIX) 75 MG  tablet TAKE 1 TABLET BY MOUTH ONCE A DAY 30 tablet 3  . fenofibrate (TRICOR) 145 MG tablet Take 145 mg by mouth at bedtime.    . Melatonin 3 MG TABS Take 0.5 tablets (1.5 mg total) by mouth at bedtime. 30 tablet 0  . metFORMIN (GLUCOPHAGE) 500 MG tablet Take 1 tablet (500 mg total) by mouth daily with breakfast.    . metoprolol tartrate (LOPRESSOR) 25 MG tablet TAKE 1/2 TABLET BY MOUTH TWICE A DAY 30 tablet 0  . Multiple Vitamins-Minerals (MACULAR VITAMIN BENEFIT PO) Take 1 tablet by mouth daily.    . nitroGLYCERIN (NITROSTAT) 0.4 MG SL tablet Place 1 tablet (0.4 mg total) under the tongue every 5 (five) minutes as needed for chest pain. 30 tablet prn  . Omega-3 Fatty Acids (FISH OIL) 1200 MG CAPS Take 1,200 mg by mouth 3 (three) times daily.     Marland Kitchen omeprazole (PRILOSEC) 40 MG capsule Take 40 mg by mouth daily.    . sitaGLIPtin (JANUVIA) 50 MG tablet Take 1 tablet (50 mg total) by mouth daily. 90 tablet 1  . traMADol (ULTRAM) 50 MG tablet Take 1 tablet (50 mg total) by mouth every 6 (six) hours as needed for moderate pain. 30 tablet 0  . furosemide (LASIX) 20 MG tablet Take 1 tablet (  20 mg total) by mouth daily. 90 tablet 3  . Insulin Detemir (LEVEMIR FLEXTOUCH) 100 UNIT/ML Pen Inject 10 Units into the skin daily at 10 pm.  3   No facility-administered medications prior to visit.      Per HPI unless specifically indicated in ROS section below Review of Systems     Objective:    BP 126/60 (BP Location: Right Arm, Patient Position: Sitting, Cuff Size: Large)   Pulse (!) 59   Temp 97.8 F (36.6 C) (Oral)   Ht '5\' 10"'$  (1.778 m)   Wt 276 lb 8 oz (125.4 kg)   SpO2 97%   BMI 39.67 kg/m   Wt Readings from Last 3 Encounters:  05/07/18 276 lb 8 oz (125.4 kg)  03/08/18 267 lb (121.1 kg)  03/06/18 266 lb 12.8 oz (121 kg)    Physical Exam  Constitutional: He appears well-developed and well-nourished. No distress.  Musculoskeletal: Normal range of motion. He exhibits edema.  R knee WNL L  knee: FROM at knee without pain No pain at pes anserine bursa Tender hematoma medial left leg below knee No palpable cords  Skin: Bruising and ecchymosis noted.     Nursing note and vitals reviewed.  Results for orders placed or performed in visit on 04/11/18  CK (Creatine Kinase)  Result Value Ref Range   Total CK 99 24 - 204 U/L  Comp Met (CMET)  Result Value Ref Range   Glucose 111 (H) 65 - 99 mg/dL   BUN 21 8 - 27 mg/dL   Creatinine, Ser 1.49 (H) 0.76 - 1.27 mg/dL   GFR calc non Af Amer 45 (L) >59 mL/min/1.73   GFR calc Af Amer 52 (L) >59 mL/min/1.73   BUN/Creatinine Ratio 14 10 - 24   Sodium 139 134 - 144 mmol/L   Potassium 4.9 3.5 - 5.2 mmol/L   Chloride 103 96 - 106 mmol/L   CO2 24 20 - 29 mmol/L   Calcium 9.4 8.6 - 10.2 mg/dL   Total Protein 6.2 6.0 - 8.5 g/dL   Albumin 3.8 3.5 - 4.8 g/dL   Globulin, Total 2.4 1.5 - 4.5 g/dL   Albumin/Globulin Ratio 1.6 1.2 - 2.2   Bilirubin Total 0.4 0.0 - 1.2 mg/dL   Alkaline Phosphatase 32 (L) 39 - 117 IU/L   AST 15 0 - 40 IU/L   ALT 13 0 - 44 IU/L  Lipid Profile  Result Value Ref Range   Cholesterol, Total 139 100 - 199 mg/dL   Triglycerides 201 (H) 0 - 149 mg/dL   HDL 28 (L) >39 mg/dL   VLDL Cholesterol Cal 40 5 - 40 mg/dL   LDL Calculated 71 0 - 99 mg/dL   Chol/HDL Ratio 5.0 0.0 - 5.0 ratio      Assessment & Plan:   Problem List Items Addressed This Visit    Leg hematoma, left, initial encounter - Primary    Large hematoma L medial leg below knee - already slowly improving. Needs to continue DAPT. Supportive care reviewed as per instructions. Reassess at DM f/u next week. Pt and wife agree with plan.        Other Visit Diagnoses    Fall with injury, initial encounter           No orders of the defined types were placed in this encounter.  No orders of the defined types were placed in this encounter.   Follow up plan: Return if symptoms worsen or fail to improve.  Ria Bush, MD

## 2018-05-08 ENCOUNTER — Ambulatory Visit (HOSPITAL_COMMUNITY): Payer: Medicare Other

## 2018-05-10 ENCOUNTER — Ambulatory Visit (HOSPITAL_COMMUNITY): Payer: Medicare Other

## 2018-05-14 ENCOUNTER — Encounter: Payer: Self-pay | Admitting: Family Medicine

## 2018-05-14 ENCOUNTER — Ambulatory Visit: Payer: Medicare Other | Admitting: Family Medicine

## 2018-05-14 VITALS — BP 122/66 | HR 58 | Temp 97.9°F | Ht 70.0 in | Wt 278.0 lb

## 2018-05-14 DIAGNOSIS — I9789 Other postprocedural complications and disorders of the circulatory system, not elsewhere classified: Secondary | ICD-10-CM | POA: Diagnosis not present

## 2018-05-14 DIAGNOSIS — E118 Type 2 diabetes mellitus with unspecified complications: Secondary | ICD-10-CM

## 2018-05-14 DIAGNOSIS — I4891 Unspecified atrial fibrillation: Secondary | ICD-10-CM

## 2018-05-14 DIAGNOSIS — S8012XD Contusion of left lower leg, subsequent encounter: Secondary | ICD-10-CM | POA: Diagnosis not present

## 2018-05-14 DIAGNOSIS — S90861A Insect bite (nonvenomous), right foot, initial encounter: Secondary | ICD-10-CM

## 2018-05-14 DIAGNOSIS — W57XXXA Bitten or stung by nonvenomous insect and other nonvenomous arthropods, initial encounter: Secondary | ICD-10-CM

## 2018-05-14 LAB — POCT GLYCOSYLATED HEMOGLOBIN (HGB A1C): Hemoglobin A1C: 5.9 % — AB (ref 4.0–5.6)

## 2018-05-14 LAB — CBC WITH DIFFERENTIAL/PLATELET
BASOS ABS: 0 10*3/uL (ref 0.0–0.1)
Basophils Relative: 0.6 % (ref 0.0–3.0)
EOS ABS: 0.2 10*3/uL (ref 0.0–0.7)
Eosinophils Relative: 3.1 % (ref 0.0–5.0)
HEMATOCRIT: 34.1 % — AB (ref 39.0–52.0)
HEMOGLOBIN: 11.2 g/dL — AB (ref 13.0–17.0)
LYMPHS PCT: 24.7 % (ref 12.0–46.0)
Lymphs Abs: 1.4 10*3/uL (ref 0.7–4.0)
MCHC: 32.7 g/dL (ref 30.0–36.0)
MCV: 89.8 fl (ref 78.0–100.0)
Monocytes Absolute: 0.5 10*3/uL (ref 0.1–1.0)
Monocytes Relative: 8.6 % (ref 3.0–12.0)
Neutro Abs: 3.5 10*3/uL (ref 1.4–7.7)
Neutrophils Relative %: 63 % (ref 43.0–77.0)
PLATELETS: 299 10*3/uL (ref 150.0–400.0)
RBC: 3.8 Mil/uL — ABNORMAL LOW (ref 4.22–5.81)
RDW: 17.6 % — ABNORMAL HIGH (ref 11.5–15.5)
WBC: 5.5 10*3/uL (ref 4.0–10.5)

## 2018-05-14 LAB — RENAL FUNCTION PANEL
Albumin: 3.9 g/dL (ref 3.5–5.2)
BUN: 25 mg/dL — ABNORMAL HIGH (ref 6–23)
CHLORIDE: 106 meq/L (ref 96–112)
CO2: 27 meq/L (ref 19–32)
CREATININE: 1.59 mg/dL — AB (ref 0.40–1.50)
Calcium: 9.4 mg/dL (ref 8.4–10.5)
GFR: 45.23 mL/min — AB (ref >60.00)
Glucose, Bld: 127 mg/dL — ABNORMAL HIGH (ref 70–99)
PHOSPHORUS: 3.6 mg/dL (ref 2.3–4.6)
Potassium: 4.7 mEq/L (ref 3.5–5.1)
SODIUM: 141 meq/L (ref 135–145)

## 2018-05-14 NOTE — Patient Instructions (Addendum)
Labs today - A1c was 5.9 but I worry this is partly related to drop in blood count. We will be in touch with results.  No medicine changes for now.  Tick removed today.  Return in 3 months for follow up visit.

## 2018-05-14 NOTE — Assessment & Plan Note (Signed)
Sounds regular today.  ?

## 2018-05-14 NOTE — Assessment & Plan Note (Signed)
Tick fully removed. Likely present <24 hours. Reviewed signs of tick born illness to monitor for.

## 2018-05-14 NOTE — Progress Notes (Addendum)
BP 122/66 (BP Location: Left Arm, Patient Position: Sitting, Cuff Size: Large)   Pulse (!) 58   Temp 97.9 F (36.6 C) (Oral)   Ht 5' 10"  (1.778 m)   Wt 278 lb (126.1 kg)   SpO2 96%   BMI 39.89 kg/m    CC: DM f/u visit Subjective:    Patient ID: [name] Luna., male    DOB: 1941/09/01, 77 y.o.   MRN: [id number]  HPI: [name] Gullikson. is a 77 y.o. male presenting on 05/14/2018 for Diabetes (Here for 3 mo f/u. Pt accompanied by his wife.)   Seen here last week after fall with resultant L leg hematoma on plavix and aspirin. Persistent bruising and hematoma.   DM - does regularly check sugars and brings log BID - fasting 110-130, PM postprandial readings 120-160. Compliant with antihyperglycemic regimen which includes: metformin 528m daily and januvia 575mdaily. Stopped levemir early 04/2018 due to good sugar control. Denies low sugars or hypoglycemic symptoms. Denies paresthesias. Last diabetic eye exam 03/2018. Pneumovax: 2013. Prevnar: 2015. Glucometer brand: accu-check. DSME: has not completed. Lab Results  Component Value Date   HGBA1C 5.9 (A) 05/14/2018   Diabetic Foot Exam - Simple   Simple Foot Form Diabetic Foot exam was performed with the following findings:  Yes 05/14/2018  9:28 AM  Visual Inspection No deformities, no ulcerations, no other skin breakdown bilaterally:  Yes Sensation Testing See comments:  Yes Pulse Check Posterior Tibialis and Dorsalis pulse intact bilaterally:  Yes Comments Diminished sensation to monofilament testing    Lab Results  Component Value Date   MICROALBUR <0.7 02/08/2018     Relevant past medical, surgical, family and social history reviewed and updated as indicated. Interim medical history since our last visit reviewed. Allergies and medications reviewed and updated. Outpatient Medications Prior to Visit  Medication Sig Dispense Refill  . ACCU-CHEK AVIVA PLUS test strip USE AS DIRECTED TO CHECK BLOOD SUGAR UP TO FOUR TIMES  DAILY 100 each 2  . amiodarone (PACERONE) 200 MG tablet Take 1 tablet (200 mg total) by mouth 2 (two) times daily. 90 tablet 3  . aspirin EC 81 MG tablet Take 81 mg by mouth daily.    . Marland Kitchentorvastatin (LIPITOR) 80 MG tablet Take 1 tablet (80 mg total) by mouth daily. 90 tablet 3  . blood glucose meter kit and supplies Dispense based on patient and insurance preference. Use up to four times daily as directed. (FOR ICD-10 E10.9, E11.9). 1 each 0  . Cholecalciferol (VITAMIN D) 2000 UNITS CAPS Take 2,000 Units by mouth daily.     . clopidogrel (PLAVIX) 75 MG tablet TAKE 1 TABLET BY MOUTH ONCE A DAY 30 tablet 3  . fenofibrate (TRICOR) 145 MG tablet Take 145 mg by mouth at bedtime.    . metFORMIN (GLUCOPHAGE) 500 MG tablet Take 1 tablet (500 mg total) by mouth daily with breakfast.    . metoprolol tartrate (LOPRESSOR) 25 MG tablet TAKE 1/2 TABLET BY MOUTH TWICE A DAY 30 tablet 0  . Multiple Vitamins-Minerals (MACULAR VITAMIN BENEFIT PO) Take 1 tablet by mouth daily.    . nitroGLYCERIN (NITROSTAT) 0.4 MG SL tablet Place 1 tablet (0.4 mg total) under the tongue every 5 (five) minutes as needed for chest pain. 30 tablet prn  . Omega-3 Fatty Acids (FISH OIL) 1200 MG CAPS Take 1,200 mg by mouth 3 (three) times daily.     . Marland Kitchenmeprazole (PRILOSEC) 40 MG capsule Take 40 mg by mouth daily.    .Marland Kitchen  sitaGLIPtin (JANUVIA) 50 MG tablet Take 1 tablet (50 mg total) by mouth daily. 90 tablet 1  . traMADol (ULTRAM) 50 MG tablet Take 1 tablet (50 mg total) by mouth every 6 (six) hours as needed for moderate pain. 30 tablet 0  . Melatonin 3 MG TABS Take 0.5 tablets (1.5 mg total) by mouth at bedtime. 30 tablet 0   No facility-administered medications prior to visit.      Per HPI unless specifically indicated in ROS section below Review of Systems     Objective:    BP 122/66 (BP Location: Left Arm, Patient Position: Sitting, Cuff Size: Large)   Pulse (!) 58   Temp 97.9 F (36.6 C) (Oral)   Ht 5' 10"  (1.778 m)   Wt  278 lb (126.1 kg)   SpO2 96%   BMI 39.89 kg/m   Wt Readings from Last 3 Encounters:  05/14/18 278 lb (126.1 kg)  05/07/18 276 lb 8 oz (125.4 kg)  03/08/18 267 lb (121.1 kg)    Physical Exam  Constitutional: He appears well-developed and well-nourished. No distress.  HENT:  Head: Normocephalic and atraumatic.  Right Ear: External ear normal.  Left Ear: External ear normal.  Nose: Nose normal.  Mouth/Throat: Oropharynx is clear and moist. No oropharyngeal exudate.  Eyes: Pupils are equal, round, and reactive to light. Conjunctivae and EOM are normal. No scleral icterus.  Neck: Normal range of motion. Neck supple.  Cardiovascular: Normal rate, regular rhythm, normal heart sounds and intact distal pulses.  No murmur heard. Pulmonary/Chest: Effort normal and breath sounds normal. No respiratory distress. He has no wheezes. He has no rales.  Musculoskeletal: He exhibits no edema.  See HPI for foot exam if done 1+ DP bilaterally Hematoma L medial superior lower leg just below knee Bruising/ecchymosis above and below hematoma  FROM at knee  Lymphadenopathy:    He has no cervical adenopathy.  Skin: Skin is warm and dry. Bruising and ecchymosis noted. No rash noted. No erythema.  Small tick removed from right dorsal foot   Psychiatric: He has a normal mood and affect.  Nursing note and vitals reviewed.  Results for orders placed or performed in visit on 05/14/18  POCT glycosylated hemoglobin (Hb A1C)  Result Value Ref Range   Hemoglobin A1C 5.9 (A) 4.0 - 5.6 %   HbA1c POC (<> result, manual entry)     HbA1c, POC (prediabetic range)     HbA1c, POC (controlled diabetic range)        Assessment & Plan:   Problem List Items Addressed This Visit    Tick bite of right foot    Tick fully removed. Likely present <24 hours. Reviewed signs of tick born illness to monitor for.       Severe obesity (BMI 35.0-39.9) with comorbidity ([place])    Weight gain noted. He is off diuretic (stopped  03/25/2018 according to wife).       Postoperative atrial fibrillation ([place])    Sounds regular today.       Leg hematoma, left, subsequent encounter    Injury happened 05/04/2018. Slow recovery with persistent bruising and swelling. No erythema. Check CBC today. He continues aspirin and plavix after recent CABG      Relevant Orders   CBC with Differential/Platelet   Controlled diabetes mellitus type 2 with complications (HCC) - Primary    Chronic, overall stable off levemir. Anticipate higher A1c based on cbg results, however it returned lower at 5.9% - concern for falsely low  due to recent blood loss into LLE hematoma - check CBC today. No med changes until we get these results.       Relevant Orders   POCT glycosylated hemoglobin (Hb A1C) (Completed)   Fructosamine   Renal function panel       No orders of the defined types were placed in this encounter.  Orders Placed This Encounter  Procedures  . CBC with Differential/Platelet  . Fructosamine  . Renal function panel  . POCT glycosylated hemoglobin (Hb A1C)    Follow up plan: Return in about 3 months (around 08/13/2018), or if symptoms worsen or fail to improve, for follow up visit.  Ria Bush, MD

## 2018-05-14 NOTE — Assessment & Plan Note (Signed)
Chronic, overall stable off levemir. Anticipate higher A1c based on cbg results, however it returned lower at 5.9% - concern for falsely low due to recent blood loss into LLE hematoma - check CBC today. No med changes until we get these results.

## 2018-05-14 NOTE — Assessment & Plan Note (Addendum)
Weight gain noted. He is off diuretic (stopped 03/25/2018 according to wife).

## 2018-05-14 NOTE — Assessment & Plan Note (Addendum)
Injury happened 05/04/2018. Slow recovery with persistent bruising and swelling. No erythema. Check CBC today. He continues aspirin and plavix after recent CABG

## 2018-05-15 ENCOUNTER — Ambulatory Visit (HOSPITAL_COMMUNITY): Payer: Medicare Other

## 2018-05-15 LAB — FRUCTOSAMINE: FRUCTOSAMINE: 224 umol/L (ref 190–270)

## 2018-05-17 ENCOUNTER — Ambulatory Visit (HOSPITAL_COMMUNITY): Payer: Medicare Other

## 2018-05-20 ENCOUNTER — Ambulatory Visit (HOSPITAL_COMMUNITY): Payer: Medicare Other

## 2018-05-22 ENCOUNTER — Ambulatory Visit (HOSPITAL_COMMUNITY): Payer: Medicare Other

## 2018-05-24 ENCOUNTER — Encounter: Payer: Self-pay | Admitting: Internal Medicine

## 2018-05-24 ENCOUNTER — Ambulatory Visit (HOSPITAL_COMMUNITY): Payer: Medicare Other

## 2018-05-24 ENCOUNTER — Ambulatory Visit: Payer: Medicare Other | Admitting: Internal Medicine

## 2018-05-24 VITALS — BP 126/72 | HR 60 | Ht 70.0 in | Wt 277.0 lb

## 2018-05-24 DIAGNOSIS — I5032 Chronic diastolic (congestive) heart failure: Secondary | ICD-10-CM | POA: Diagnosis not present

## 2018-05-24 DIAGNOSIS — I9789 Other postprocedural complications and disorders of the circulatory system, not elsewhere classified: Secondary | ICD-10-CM | POA: Diagnosis not present

## 2018-05-24 DIAGNOSIS — I10 Essential (primary) hypertension: Secondary | ICD-10-CM

## 2018-05-24 DIAGNOSIS — I4891 Unspecified atrial fibrillation: Secondary | ICD-10-CM

## 2018-05-24 DIAGNOSIS — I5022 Chronic systolic (congestive) heart failure: Secondary | ICD-10-CM | POA: Insufficient documentation

## 2018-05-24 DIAGNOSIS — E785 Hyperlipidemia, unspecified: Secondary | ICD-10-CM

## 2018-05-24 DIAGNOSIS — I2510 Atherosclerotic heart disease of native coronary artery without angina pectoris: Secondary | ICD-10-CM | POA: Diagnosis not present

## 2018-05-24 MED ORDER — METOPROLOL SUCCINATE ER 25 MG PO TB24: 25.0000 mg | ORAL_TABLET | Freq: Every day | ORAL | 3 refills | Status: DC

## 2018-05-24 NOTE — Progress Notes (Signed)
Follow-up Outpatient Visit Date: 05/24/2018  Primary Care Provider: Ria Bush, MD [place] [id number]  Chief Complaint: Follow-up CAD  HPI:  [name] Young is a 77 y.o. year-old male with history of three-vessel CAD status post CABG (09/930) complicated by postoperative atrial fibrillation, hypertension, hyperlipidemia, type 2 diabetes mellitus, and chronic kidney disease stage III, who presents for follow-up of coronary artery disease.  I last saw [name] Young in late June following CABG.  He was feeling well with significantly better shortness of breath and fatigue compared to before his bypass.  Leg edema had almost completely resolved, prompting [place] to decrease furosemide from 40 mg to 20 mg daily.  Statin therapy was also escalated.  Today, [name] Young' only complaint is of pain and bruising involving the left leg.  He fell into a whole while pursuing a groundhog this summer.  Pain and swelling are gradually improving.  He has otherwise been doing well, denying chest pain, shortness of breath, palpitations, and lightheadedness.  Left leg has been swollen since aforementioned injury.  He remains complaint with his medications but is hopeful that some can be stopped.  --------------------------------------------------------------------------------------------------  Cardiovascular History & Procedures: Cardiovascular Problems:  Multivessel coronary artery disease  Risk Factors:  Hypertension, hyperlipidemia, type 2 diabetes mellitus, obesity, male gender, and age greater than 53  Cath/PCI:  LHC (12/21/17): LMCA normal. LAD with sequential 20% proximal and 70-80%mid LAD stenoses. Mid LAD lesion involves moderate-caliber D1 branch, with 70% ostial and 90% mid stenoses Ladona Mow 0,1,1). LargeLCx with small OM1 with 80% stenosis and moderate OM2 with subtotal chronic occlusion and bridging collaterals, and chronic total occlusion of the proximal RCA with  left-to-right collaterals filling the RPDA.  CV Surgery:  CABG (01/18/2018, Dr. Darcey Nora): LIMA to LAD, SVG to diagonal, and SVG to PL branch of RCA.  LAD endarterectomy required for bypass grafting.  EP Procedures and Devices:  None  Non-Invasive Evaluation(s):  Exercise MPI (12/12/17): Intermediate risk study with moderate in size, moderate in severity basal and mid inferolateral reversible defect consistent with ischemia. LVEF mildly reduced at 51% with inferolateral hypokinesis. Poor exercise tolerance with frequent PVCs noted during stress.  TTE (11/23/17): Normal LV size with mild LVH. LVEF 50-55% with normal wall motion and grade 1 diastolic dysfunction. Mildly thickened and calcified mitral valve consistent with rheumatic disease. No evidence of stenosis. Trivial regurgitation. Normal RV size and function. Normal PA pressure.  Recent CV Pertinent Labs: Lab Results  Component Value Date   CHOL 139 04/11/2018   CHOL 178 02/01/2010   HDL 28 (L) 04/11/2018   LDLCALC 71 04/11/2018   LDLDIRECT 67.0 06/11/2017   TRIG 201 (H) 04/11/2018   TRIG 812 02/01/2010   CHOLHDL 5.0 04/11/2018   CHOLHDL 4 12/10/2017   INR 1.20 01/18/2018   K 4.7 05/14/2018   K 4.5 02/01/2010   MG 2.6 (H) 01/23/2018   BUN 25 (H) 05/14/2018   BUN 21 04/11/2018   CREATININE 1.59 (H) 05/14/2018   CREATININE 0.95 02/01/2010    Past medical and surgical history were reviewed and updated in EPIC.  Current Meds  Medication Sig  . ACCU-CHEK AVIVA PLUS test strip USE AS DIRECTED TO CHECK BLOOD SUGAR UP TO FOUR TIMES DAILY  . amiodarone (PACERONE) 200 MG tablet Take 1 tablet (200 mg total) by mouth 2 (two) times daily.  Marland Kitchen aspirin EC 81 MG tablet Take 81 mg by mouth daily.  Marland Kitchen atorvastatin (LIPITOR) 80 MG tablet Take 1 tablet (80  mg total) by mouth daily.  . blood glucose meter kit and supplies Dispense based on patient and insurance preference. Use up to four times daily as directed. (FOR ICD-10 E10.9,  E11.9).  Marland Kitchen Cholecalciferol (VITAMIN D) 2000 UNITS CAPS Take 2,000 Units by mouth daily.   . clopidogrel (PLAVIX) 75 MG tablet TAKE 1 TABLET BY MOUTH ONCE A DAY  . fenofibrate (TRICOR) 145 MG tablet Take 145 mg by mouth at bedtime.  . metFORMIN (GLUCOPHAGE) 500 MG tablet Take 1 tablet (500 mg total) by mouth daily with breakfast.  . metoprolol tartrate (LOPRESSOR) 25 MG tablet TAKE 1/2 TABLET BY MOUTH TWICE A DAY  . Multiple Vitamins-Minerals (MACULAR VITAMIN BENEFIT PO) Take 1 tablet by mouth daily.  . nitroGLYCERIN (NITROSTAT) 0.4 MG SL tablet Place 1 tablet (0.4 mg total) under the tongue every 5 (five) minutes as needed for chest pain.  . Omega-3 Fatty Acids (FISH OIL) 1200 MG CAPS Take 1,200 mg by mouth 3 (three) times daily.   Marland Kitchen omeprazole (PRILOSEC) 40 MG capsule Take 40 mg by mouth daily.  . sitaGLIPtin (JANUVIA) 50 MG tablet Take 1 tablet (50 mg total) by mouth daily.  . traMADol (ULTRAM) 50 MG tablet Take 1 tablet (50 mg total) by mouth every 6 (six) hours as needed for moderate pain.    Allergies: Patient has no known allergies.  Social History   Tobacco Use  . Smoking status: Never Smoker  . Smokeless tobacco: Former Systems developer    Types: Chew  Substance Use Topics  . Alcohol use: Yes    Comment: occ  . Drug use: No    Family History  Problem Relation Age of Onset  . Alzheimer's disease Mother   . Breast cancer Mother        breast  . Atrial fibrillation Mother   . CAD Mother   . Stroke Father 16  . Diabetes Father   . CAD Father   . Rectal cancer Maternal Grandfather        rectal  . Colon cancer Maternal Grandfather 2  . Esophageal cancer Neg Hx   . Stomach cancer Neg Hx     Review of Systems: A 12-system review of systems was performed and was negative except as noted in the HPI.  --------------------------------------------------------------------------------------------------  Physical Exam: BP 126/72   Pulse 60   Ht _0  (1.778 m)   Wt 277 lb (125.6  kg)   SpO2 98%   BMI 39.75 kg/m   General:  NAD.  Accompanied by his wife. HEENT: No conjunctival pallor or scleral icterus. Moist mucous membranes.  OP clear. Neck: Supple without lymphadenopathy, thyromegaly, JVD, or HJR.  Lungs: Normal work of breathing. Clear to auscultation bilaterally without wheezes or crackles. Heart: Regular rate and rhythm without murmurs, rubs, or gallops. Unable to assess PMI due to body habitus. Abd: Bowel sounds present. Soft, NT/ND. Unable to assess HSM due to body habitus. Ext: 1+ left calf edema with significant bruising about the knee.  Right proximal calf hematoma noted medially. Skin: Warm and dry without rash.  EKG:  NSR with nonspecific ST changes.  Lab Results  Component Value Date   WBC 5.5 05/14/2018   HGB 11.2 (L) 05/14/2018   HCT 34.1 (L) 05/14/2018   MCV 89.8 05/14/2018   PLT 299.0 05/14/2018    Lab Results  Component Value Date   NA 141 05/14/2018   K 4.7 05/14/2018   CL 106 05/14/2018   CO2 27 05/14/2018   BUN 25 (  H) 05/14/2018   CREATININE 1.59 (H) 05/14/2018   GLUCOSE 127 (H) 05/14/2018   ALT 13 04/11/2018    Lab Results  Component Value Date   CHOL 139 04/11/2018   HDL 28 (L) 04/11/2018   LDLCALC 71 04/11/2018   LDLDIRECT 67.0 06/11/2017   TRIG 201 (H) 04/11/2018   CHOLHDL 5.0 04/11/2018    --------------------------------------------------------------------------------------------------  ASSESSMENT AND PLAN: CAD without angina [name] Young continues to do well following CABG this spring.  He will remain on indefinite ASA 81 mg daily.  Clopidogrel can be stopped at the [place] of October, at which time he will have completed 6 months of DAPT (per Dr. Lucianne Lei Trigt's recommendations).  I will transition metoprolol tartrate 12.5 mg BID to metoprolol succinate 25 mg daily for easier dosing and to avoid having to split pills, per the patient's wife's request.  HFpEF No symptoms of HF.  LLE edema likley related to recent  injury.  No medication changes at this time.  Post-operative atrial fibrillation No symptoms to suggest recurrence.  EKG today shows NSR.  We will obtain in 14-day event monitor.  If no evidence of a-fib, we will discontinue amiodarone.  Hyperlipidemia Recent lipid panel showed suboptimal LDL, prompting escalation of atorvastatin.  Repeat lipid panel to be completed in 1-2 months.  Hypertension BP well-controlled.  No medication changes at this time.  Follow-up: Return to [facility] clinic to see me in 4 months.  Nelva Bush, MD 05/24/2018 10:21 AM

## 2018-05-24 NOTE — Patient Instructions (Addendum)
Medication Instructions:  STOP Metoprolol Tartrate STOP Plavix mid October 6 months post surgery  START Metoprolol Succinate 25 mg daily  -- If you need a refill on your cardiac medications before your next appointment, please call your pharmacy. --  Labwork: July 11, 2018   Testing/Procedures: Your physician has recommended that you wear a 14 day event monitor. Event monitors are medical devices that record the heart's electrical activity. Doctors most often [place] these monitors to diagnose arrhythmias. Arrhythmias are problems with the speed or rhythm of the heartbeat. The monitor is a small, portable device. You can wear one while you do your normal daily activities. This is usually used to diagnose what is causing palpitations/syncope (passing out).   Follow-Up: Your physician wants you to follow-up in: 4 months with Dr. Saunders Revel in [place]   Thank you for choosing [facility]!!    Any Other Special Instructions Will Be Listed Below (If Applicable).

## 2018-05-27 ENCOUNTER — Ambulatory Visit (HOSPITAL_COMMUNITY): Payer: Medicare Other

## 2018-05-29 ENCOUNTER — Ambulatory Visit (HOSPITAL_COMMUNITY): Payer: Medicare Other

## 2018-05-30 ENCOUNTER — Ambulatory Visit (INDEPENDENT_AMBULATORY_CARE_PROVIDER_SITE_OTHER): Payer: Medicare Other

## 2018-05-30 DIAGNOSIS — I4891 Unspecified atrial fibrillation: Secondary | ICD-10-CM | POA: Diagnosis not present

## 2018-05-30 DIAGNOSIS — I9789 Other postprocedural complications and disorders of the circulatory system, not elsewhere classified: Secondary | ICD-10-CM | POA: Diagnosis not present

## 2018-05-30 DIAGNOSIS — I2510 Atherosclerotic heart disease of native coronary artery without angina pectoris: Secondary | ICD-10-CM

## 2018-05-31 ENCOUNTER — Ambulatory Visit (HOSPITAL_COMMUNITY): Payer: Medicare Other

## 2018-06-03 ENCOUNTER — Ambulatory Visit (HOSPITAL_COMMUNITY): Payer: Medicare Other

## 2018-06-04 DIAGNOSIS — Z23 Encounter for immunization: Secondary | ICD-10-CM | POA: Diagnosis not present

## 2018-06-05 ENCOUNTER — Ambulatory Visit (HOSPITAL_COMMUNITY): Payer: Medicare Other

## 2018-06-07 ENCOUNTER — Ambulatory Visit (HOSPITAL_COMMUNITY): Payer: Medicare Other

## 2018-06-08 ENCOUNTER — Other Ambulatory Visit: Payer: Self-pay | Admitting: Family Medicine

## 2018-06-10 ENCOUNTER — Ambulatory Visit (HOSPITAL_COMMUNITY): Payer: Medicare Other

## 2018-06-12 ENCOUNTER — Ambulatory Visit (HOSPITAL_COMMUNITY): Payer: Medicare Other

## 2018-06-14 ENCOUNTER — Ambulatory Visit (HOSPITAL_COMMUNITY): Payer: Medicare Other

## 2018-06-17 ENCOUNTER — Ambulatory Visit (HOSPITAL_COMMUNITY): Payer: Medicare Other

## 2018-06-19 ENCOUNTER — Ambulatory Visit (HOSPITAL_COMMUNITY): Payer: Medicare Other

## 2018-06-20 ENCOUNTER — Telehealth: Payer: Self-pay

## 2018-06-20 NOTE — Telephone Encounter (Signed)
Notes recorded by Frederik Schmidt, RN on 06/20/2018 at 2:28 PM EDT Spoke to patient's wife and gave her results and recommendation, discontinuing Amiodarone. She verbalized understanding.

## 2018-06-20 NOTE — Telephone Encounter (Signed)
-----   Message from Nelva Bush, MD sent at 06/20/2018 11:31 AM EDT ----- Please let [name] know that his event monitor showed no evidence of atrial fibrillation.  I think it is fine for him to discontinue amiodarone at this time.

## 2018-06-20 NOTE — Telephone Encounter (Signed)
-----   Message from Nelva Bush, MD sent at 06/20/2018 11:31 AM EDT ----- Please let [name] Young know that his event monitor showed no evidence of atrial fibrillation.  I think it is fine for him to discontinue amiodarone at this time.

## 2018-06-20 NOTE — Telephone Encounter (Signed)
Notes recorded by Frederik Schmidt, RN on 06/20/2018 at 1:55 PM EDT [id number] 10/10 ------

## 2018-06-21 ENCOUNTER — Ambulatory Visit (HOSPITAL_COMMUNITY): Payer: Medicare Other

## 2018-06-24 ENCOUNTER — Ambulatory Visit (HOSPITAL_COMMUNITY): Payer: Medicare Other

## 2018-06-26 ENCOUNTER — Ambulatory Visit (HOSPITAL_COMMUNITY): Payer: Medicare Other

## 2018-06-28 ENCOUNTER — Ambulatory Visit (HOSPITAL_COMMUNITY): Payer: Medicare Other

## 2018-07-01 ENCOUNTER — Ambulatory Visit (HOSPITAL_COMMUNITY): Payer: Medicare Other

## 2018-07-03 ENCOUNTER — Ambulatory Visit (HOSPITAL_COMMUNITY): Payer: Medicare Other

## 2018-07-05 ENCOUNTER — Ambulatory Visit (HOSPITAL_COMMUNITY): Payer: Medicare Other

## 2018-07-08 ENCOUNTER — Ambulatory Visit (HOSPITAL_COMMUNITY): Payer: Medicare Other

## 2018-07-10 ENCOUNTER — Ambulatory Visit (HOSPITAL_COMMUNITY): Payer: Medicare Other

## 2018-07-11 ENCOUNTER — Other Ambulatory Visit: Payer: Medicare Other

## 2018-07-11 DIAGNOSIS — Z79899 Other long term (current) drug therapy: Secondary | ICD-10-CM

## 2018-07-11 DIAGNOSIS — E785 Hyperlipidemia, unspecified: Secondary | ICD-10-CM

## 2018-07-11 LAB — ALT: ALT: 28 IU/L (ref 0–44)

## 2018-07-11 LAB — LIPID PANEL
CHOL/HDL RATIO: 4.5 ratio (ref 0.0–5.0)
Cholesterol, Total: 153 mg/dL (ref 100–199)
HDL: 34 mg/dL — ABNORMAL LOW (ref >39)
LDL CALC: 79 mg/dL (ref 0–99)
Triglycerides: 199 mg/dL — ABNORMAL HIGH (ref 0–149)
VLDL CHOLESTEROL CAL: 40 mg/dL (ref 5–40)

## 2018-07-12 ENCOUNTER — Ambulatory Visit (HOSPITAL_COMMUNITY): Payer: Medicare Other

## 2018-07-12 ENCOUNTER — Telehealth: Payer: Self-pay

## 2018-07-12 MED ORDER — EZETIMIBE 10 MG PO TABS: 10.0000 mg | ORAL_TABLET | Freq: Every day | ORAL | 3 refills | Status: DC

## 2018-07-12 NOTE — Telephone Encounter (Signed)
Notes recorded by Frederik Schmidt, RN on 07/12/2018 at 9:00 AM EDT The patient has been notified of the result and verbalized understanding. All questions (if any) were answered. Started him on Zetia 10 mg daily. Labs to be done at [facility] appt. Frederik Schmidt, RN 07/12/2018 8:59 AM

## 2018-07-12 NOTE — Telephone Encounter (Signed)
-----   Message from Nelva Bush, MD sent at 07/12/2018  7:23 AM EDT ----- Please let [name] Young know that his LDL increased slightly despite going up on atorvastatin.  Please confirm that he is taking atorvastatin 80 mg daily.  If so, I recommend that we start ezetimibe 10 mg daily and recheck a lipid panel and ALT in 3 months.

## 2018-07-15 ENCOUNTER — Ambulatory Visit (HOSPITAL_COMMUNITY): Payer: Medicare Other

## 2018-07-16 ENCOUNTER — Other Ambulatory Visit: Payer: Self-pay | Admitting: Family Medicine

## 2018-07-17 ENCOUNTER — Ambulatory Visit (HOSPITAL_COMMUNITY): Payer: Medicare Other

## 2018-07-19 ENCOUNTER — Ambulatory Visit (HOSPITAL_COMMUNITY): Payer: Medicare Other

## 2018-07-22 ENCOUNTER — Ambulatory Visit (HOSPITAL_COMMUNITY): Payer: Medicare Other

## 2018-07-24 ENCOUNTER — Ambulatory Visit (HOSPITAL_COMMUNITY): Payer: Medicare Other

## 2018-08-06 IMAGING — DX DG CHEST 1V PORT
1 series · 1 of 1 positions shown · non-contrast
Comparison: January 19, 2018

CLINICAL DATA: Status post CABG.

EXAM:
PORTABLE CHEST 1 VIEW

[chest ap]
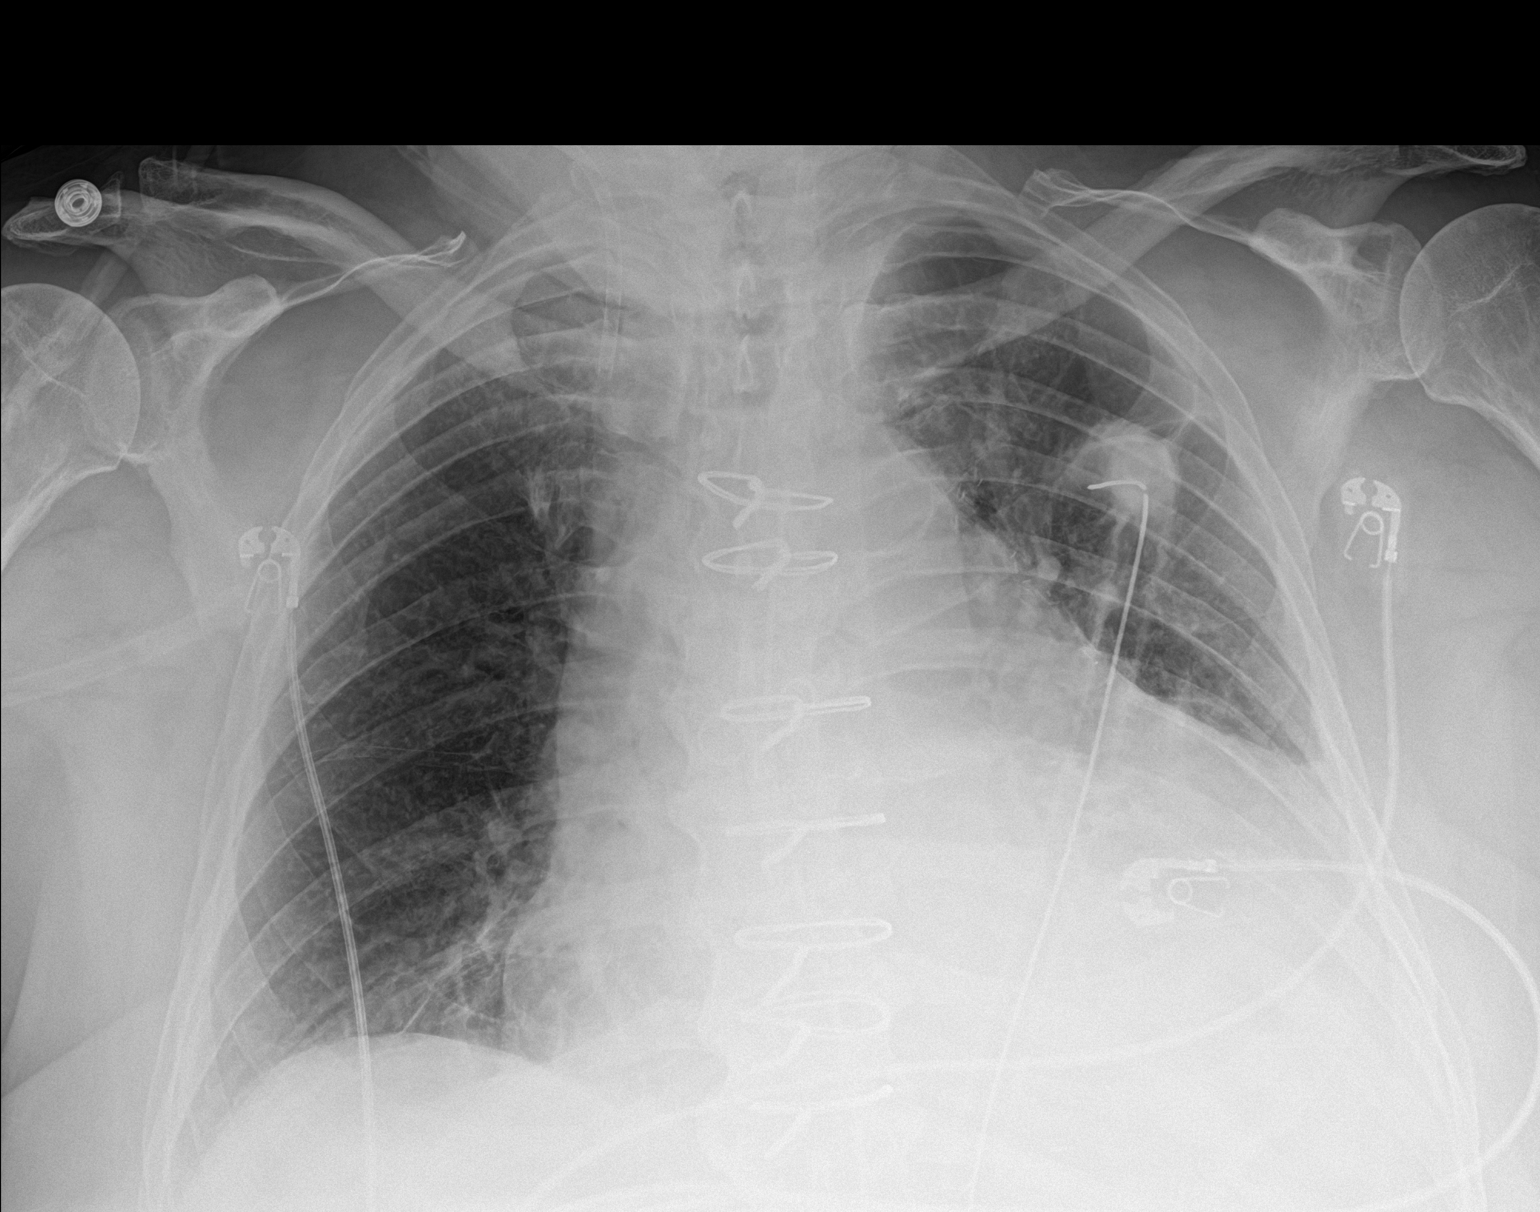

[1 of 1 positions shown; findings below may reference images not displayed]

FINDINGS: The mediastinal contour is normal. Patient status post prior median
sternotomy and CABG. Heart size is enlarged. A right jugular
vascular sheath is identified with distal tip in the superior vena
cava. Left chest tube is unchanged. There is no pneumothorax.
Consolidation of left lung base is unchanged. The visualized
skeletal structures are stable.
IMPRESSION: Left chest tube is unchanged.  There is no pneumothorax.

Persistent consolidation of left lung base unchanged.

## 2018-08-11 IMAGING — DX DG CHEST 1V PORT
1 series · 1 of 1 positions shown · non-contrast
Comparison: January 23, 2018

CLINICAL DATA: Status post CABG.  Pain.

EXAM:
PORTABLE CHEST 1 VIEW

[chest ap]
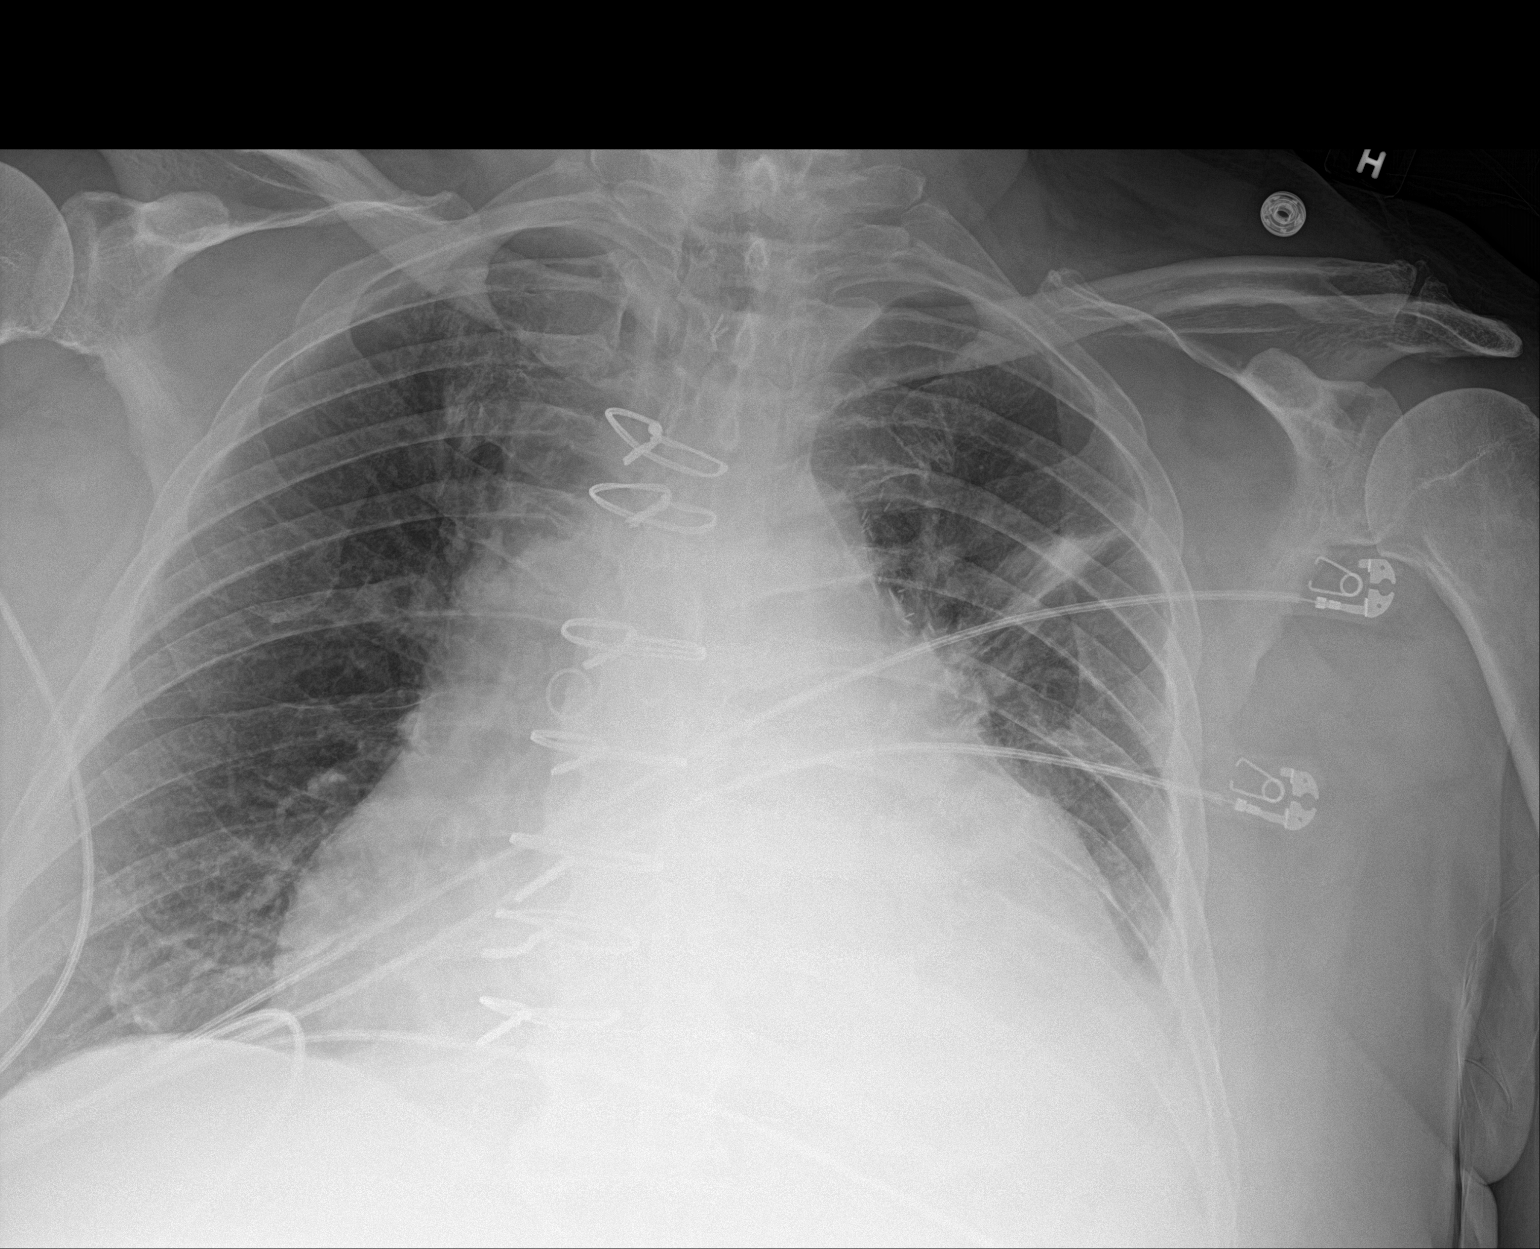

[1 of 1 positions shown; findings below may reference images not displayed]

FINDINGS: Platelike opacity in the left perihilar region is likely
atelectasis, unchanged. No pneumothorax. Opacity in the left base is
probably atelectasis, unchanged as well. Stable cardiomegaly. No
other changes.
IMPRESSION: No interval change. Stable cardiomegaly, left perihilar platelike
opacity which is likely atelectasis, and probable atelectasis in the
retrocardiac region.

## 2018-08-12 IMAGING — DX DG CHEST 1V PORT
1 series · 1 of 1 positions shown · non-contrast
Comparison: Yesterday

CLINICAL DATA: Follow-up chest tube

EXAM:
PORTABLE CHEST 1 VIEW

[chest ap]
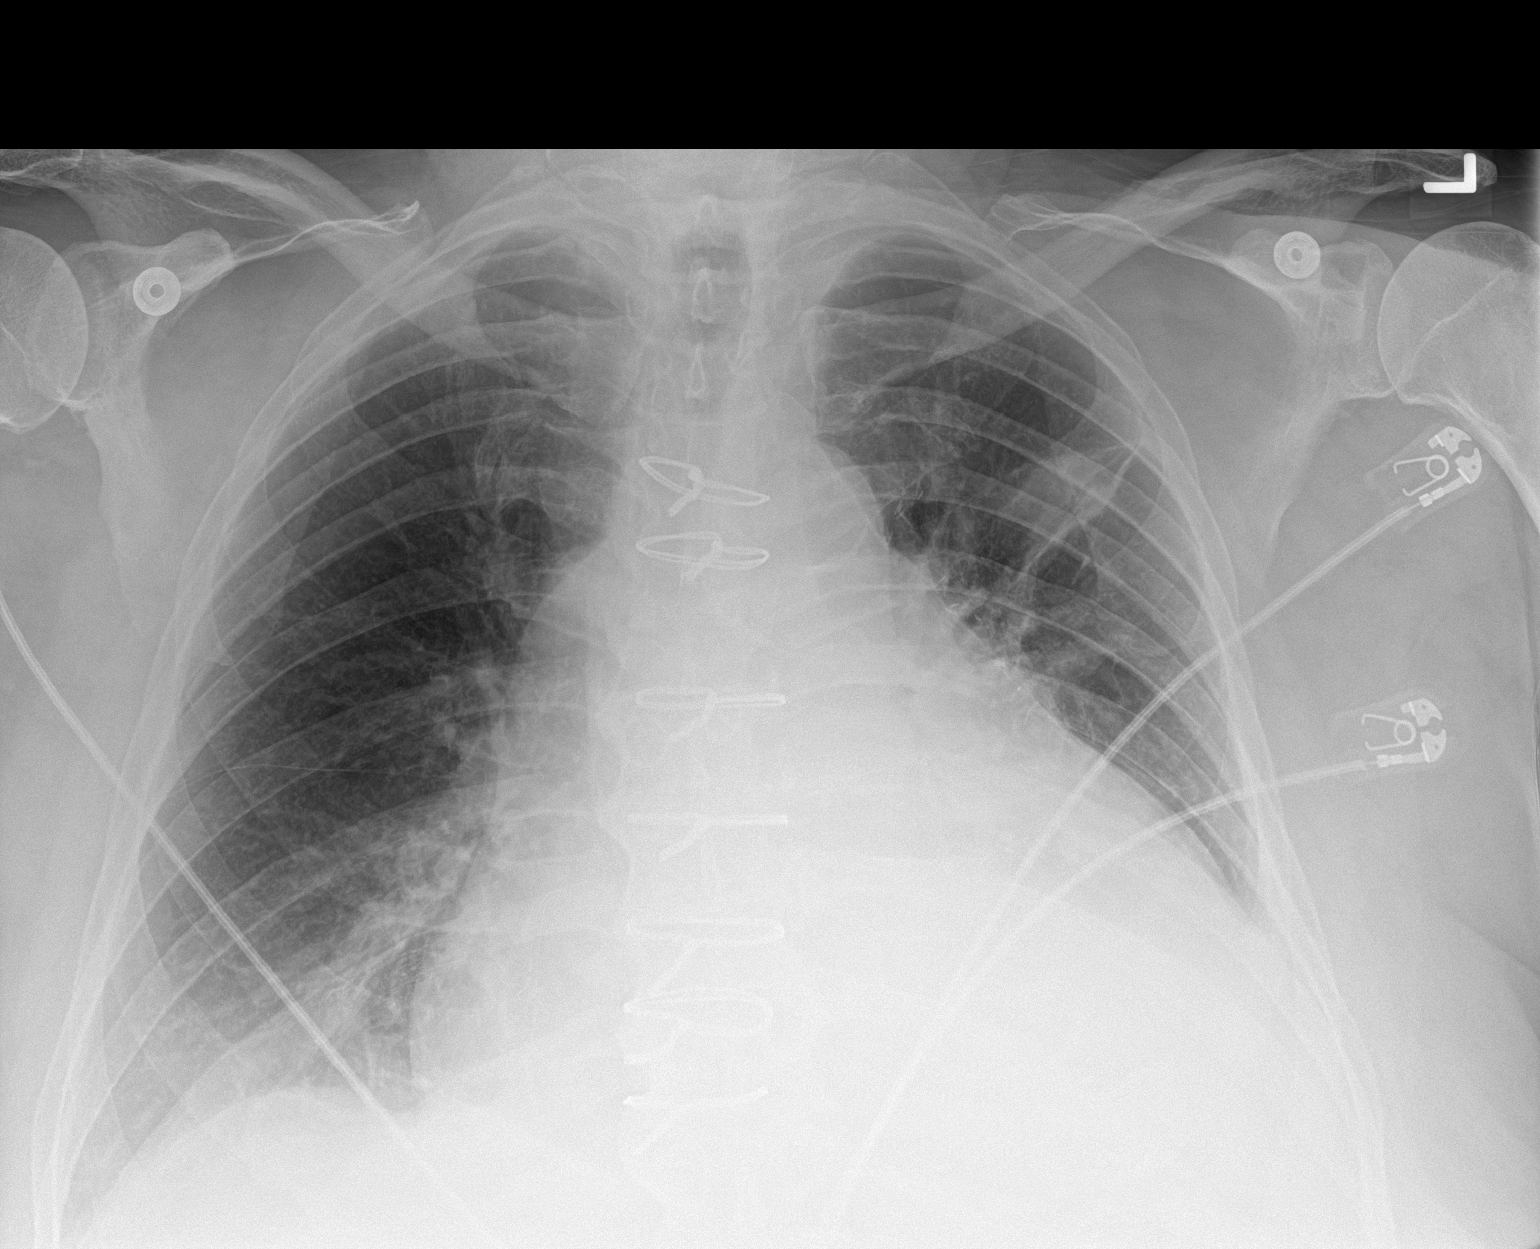

[1 of 1 positions shown; findings below may reference images not displayed]

FINDINGS: No visible chest tube.

Cardiopericardial enlargement with hazy opacity at the left more
than right base. Streaky opacity at the left apex. No visible
pneumothorax. No pulmonary edema.
IMPRESSION: 1. History of chest tube with none seen.  No visible pneumothorax.
2. Stable haziness of the lower chest that is likely atelectasis and
pleural fluid.

## 2018-08-13 ENCOUNTER — Ambulatory Visit: Payer: Medicare Other | Admitting: Family Medicine

## 2018-08-13 ENCOUNTER — Encounter: Payer: Self-pay | Admitting: Family Medicine

## 2018-08-13 VITALS — BP 130/74 | HR 56 | Temp 97.8°F | Ht 70.0 in | Wt 289.8 lb

## 2018-08-13 DIAGNOSIS — E785 Hyperlipidemia, unspecified: Secondary | ICD-10-CM | POA: Diagnosis not present

## 2018-08-13 DIAGNOSIS — I10 Essential (primary) hypertension: Secondary | ICD-10-CM

## 2018-08-13 DIAGNOSIS — Z951 Presence of aortocoronary bypass graft: Secondary | ICD-10-CM

## 2018-08-13 DIAGNOSIS — E118 Type 2 diabetes mellitus with unspecified complications: Secondary | ICD-10-CM

## 2018-08-13 DIAGNOSIS — S8012XD Contusion of left lower leg, subsequent encounter: Secondary | ICD-10-CM

## 2018-08-13 DIAGNOSIS — K219 Gastro-esophageal reflux disease without esophagitis: Secondary | ICD-10-CM

## 2018-08-13 LAB — POCT GLYCOSYLATED HEMOGLOBIN (HGB A1C): Hemoglobin A1C: 5.8 % — AB (ref 4.0–5.6)

## 2018-08-13 MED ORDER — FISH OIL 1200 MG PO CAPS: 1200.0000 mg | ORAL_CAPSULE | Freq: Two times a day (BID) | ORAL | Status: DC

## 2018-08-13 NOTE — Patient Instructions (Addendum)
Diabetes is doing well! Continue current medicines. Check with cardiology about vascepa in place of fish oil.  You are doing well today.  Return for physical in April.  Price out ozempic or trulicity (once weekly injections) or jardiance (once daily pill) for diabetes - in place of januvia.  Weight is up - watch snacking between meals. Restart walking routine, with walking stick.

## 2018-08-13 NOTE — Assessment & Plan Note (Signed)
Chronic, stable. Continue current regimen. States off lasix.

## 2018-08-13 NOTE — Progress Notes (Signed)
BP 130/74 (BP Location: Left Arm, Patient Position: Sitting, Cuff Size: Normal)   Pulse (!) 56   Temp 97.8 F (36.6 C) (Oral)   Ht 5' 10"  (1.778 m)   Wt 289 lb 12 oz (131.4 kg)   SpO2 97%   BMI 41.57 kg/m    CC: 3 mo f/u visit Subjective:    Patient ID: [name] Luna., male    DOB: 1941-01-13, 77 y.o.   MRN: [id number]  HPI: [name] Young. is a 77 y.o. male presenting on 08/13/2018 for Follow-up (Here for 3 mo f/u. Pt accompanied by his wife, [name] Young.)   He's been bear hunting.   Since last seek, had anther fall while cutting plant on the ground. Felt lightheaded and fell forward. Fortunately no injury sustained. Intermittent lightheadedness usually when stooping down.   Saw cardiology 05/2018, note reviewed. Amiodarone discontinued as event monitor did not show recurrent atrial fibrillation. MI suffered 01/2018. Completed 6 months of DAPT, now only on aspirin 76m indefinitely. No dyspnea, chest pain or leg swelling.   HLD - recently started on zetia in addition to his tricor and fish oil and atorvastatin 820m  DM - does regularly check sugars 126 this morning, 119 yesterday. Compliant with antihyperglycemic regimen which includes: metformin 50057maily and januvia 17m3mily. Denies low sugars or hypoglycemic symptoms. Denies paresthesias. Last diabetic eye exam 03/2018. Pneumovax: 2013. Prevnar: 2015. Glucometer brand: accu check. DSME: declines.  Lab Results  Component Value Date   HGBA1C 5.8 (A) 08/13/2018   Diabetic Foot Exam - Simple   No data filed     Lab Results  Component Value Date   MICROALBUR <0.7 02/08/2018     Relevant past medical, surgical, family and social history reviewed and updated as indicated. Interim medical history since our last visit reviewed. Allergies and medications reviewed and updated. Outpatient Medications Prior to Visit  Medication Sig Dispense Refill  . ACCU-CHEK AVIVA PLUS test strip USE AS DIRECTED TO CHECK BLOOD  SUGARS UPTO FOUR TIMES DAILY. 100 each 2  . aspirin EC 81 MG tablet Take 81 mg by mouth daily.    . atMarland Kitchenrvastatin (LIPITOR) 80 MG tablet Take 1 tablet (80 mg total) by mouth daily. 90 tablet 3  . blood glucose meter kit and supplies Dispense based on patient and insurance preference. Use up to four times daily as directed. (FOR ICD-10 E10.9, E11.9). 1 each 0  . Cholecalciferol (VITAMIN D) 2000 UNITS CAPS Take 2,000 Units by mouth daily.     . ezMarland Kitchentimibe (ZETIA) 10 MG tablet Take 1 tablet (10 mg total) by mouth daily. 90 tablet 3  . fenofibrate (TRICOR) 145 MG tablet Take 1 tablet (145 mg total) by mouth at bedtime. 90 tablet 3  . metFORMIN (GLUCOPHAGE) 500 MG tablet Take 1 tablet (500 mg total) by mouth daily with breakfast.    . metoprolol succinate (TOPROL-XL) 25 MG 24 hr tablet Take 1 tablet (25 mg total) by mouth daily. Take with or immediately following a meal. 90 tablet 3  . Multiple Vitamins-Minerals (MACULAR VITAMIN BENEFIT PO) Take 1 tablet by mouth daily.    . nitroGLYCERIN (NITROSTAT) 0.4 MG SL tablet Place 1 tablet (0.4 mg total) under the tongue every 5 (five) minutes as needed for chest pain. 30 tablet prn  . [START ON 08/14/2018] omeprazole (PRILOSEC) 40 MG capsule Take 1 capsule (40 mg total) by mouth every Monday, Wednesday, and Friday.    . sitaGLIPtin (JANUVIA) 50 MG tablet Take 1  tablet (50 mg total) by mouth daily. 90 tablet 1  . Omega-3 Fatty Acids (FISH OIL) 1200 MG CAPS Take 1,200 mg by mouth 3 (three) times daily.     Marland Kitchen omeprazole (PRILOSEC) 40 MG capsule Take 40 mg by mouth daily.    . fenofibrate (TRICOR) 145 MG tablet Take 145 mg by mouth at bedtime.    . clopidogrel (PLAVIX) 75 MG tablet TAKE 1 TABLET BY MOUTH ONCE A DAY 30 tablet 3  . traMADol (ULTRAM) 50 MG tablet Take 1 tablet (50 mg total) by mouth every 6 (six) hours as needed for moderate pain. 30 tablet 0   No facility-administered medications prior to visit.      Per HPI unless specifically indicated in ROS  section below Review of Systems     Objective:    BP 130/74 (BP Location: Left Arm, Patient Position: Sitting, Cuff Size: Normal)   Pulse (!) 56   Temp 97.8 F (36.6 C) (Oral)   Ht 5' 10"  (1.778 m)   Wt 289 lb 12 oz (131.4 kg)   SpO2 97%   BMI 41.57 kg/m   Wt Readings from Last 3 Encounters:  08/13/18 289 lb 12 oz (131.4 kg)  05/24/18 277 lb (125.6 kg)  05/14/18 278 lb (126.1 kg)    Physical Exam  Constitutional: He appears well-developed and well-nourished. No distress.  HENT:  Head: Normocephalic and atraumatic.  Nose: Nose normal.  Mouth/Throat: Oropharynx is clear and moist. No oropharyngeal exudate.  Eyes: Pupils are equal, round, and reactive to light. Conjunctivae and EOM are normal. No scleral icterus.  Neck: Normal range of motion. Neck supple.  Cardiovascular: Normal rate, regular rhythm, normal heart sounds and intact distal pulses.  No murmur heard. Pulmonary/Chest: Effort normal and breath sounds normal. No respiratory distress. He has no wheezes. He has no rales.  Musculoskeletal: He exhibits no edema.  See HPI for foot exam if done No significan pedal edema  Lymphadenopathy:    He has no cervical adenopathy.  Skin: Skin is warm and dry. No rash noted.  Psychiatric: He has a normal mood and affect.  Nursing note and vitals reviewed.  Results for orders placed or performed in visit on 08/13/18  POCT glycosylated hemoglobin (Hb A1C)  Result Value Ref Range   Hemoglobin A1C 5.8 (A) 4.0 - 5.6 %   HbA1c POC (<> result, manual entry)     HbA1c, POC (prediabetic range)     HbA1c, POC (controlled diabetic range)     Lab Results  Component Value Date   CHOL 153 07/11/2018   HDL 34 (L) 07/11/2018   LDLCALC 79 07/11/2018   LDLDIRECT 67.0 06/11/2017   TRIG 199 (H) 07/11/2018   CHOLHDL 4.5 07/11/2018       Assessment & Plan:   Problem List Items Addressed This Visit    Severe obesity (BMI 35.0-39.9) with comorbidity ([place])    Weight gain noted, but  anticipate not due to CHF as no CHF symptoms. Discussed restarting walking routine, as well as limiting snacking between meals.       S/P CABG x 3   Leg hematoma, left, subsequent encounter    Injury happened 04/2018. Bruising has resolved. Residual leg knot.       Hyperlipidemia LDL goal <70    Zetia recently started in addition to fish oil, statin, tricor - discussed possible vascepa in place of fish oil - asked to check with cards.       GERD (gastroesophageal reflux disease)  Well controlled on MWF PPI      Relevant Medications   omeprazole (PRILOSEC) 40 MG capsule (Start on 08/14/2018)   Essential hypertension    Chronic, stable. Continue current regimen. States off lasix.       Controlled diabetes mellitus type 2 with complications (HCC) - Primary    Chronic, stable off levemir.  A1c showing great control. Continue current regimen I did ask him to price out GLP1 RA vs SGLT2 inhibitor for cardiovascular benefit.       Relevant Orders   POCT glycosylated hemoglobin (Hb A1C) (Completed)       Meds ordered this encounter  Medications  . Omega-3 Fatty Acids (FISH OIL) 1200 MG CAPS    Sig: Take 1 capsule (1,200 mg total) by mouth 2 (two) times daily.   Orders Placed This Encounter  Procedures  . POCT glycosylated hemoglobin (Hb A1C)    Follow up plan: Return if symptoms worsen or fail to improve.  Ria Bush, MD

## 2018-08-13 NOTE — Assessment & Plan Note (Signed)
Weight gain noted, but anticipate not due to CHF as no CHF symptoms. Discussed restarting walking routine, as well as limiting snacking between meals.

## 2018-08-13 NOTE — Assessment & Plan Note (Signed)
Chronic, stable off levemir.  A1c showing great control. Continue current regimen I did ask him to price out GLP1 RA vs SGLT2 inhibitor for cardiovascular benefit.

## 2018-08-13 NOTE — Assessment & Plan Note (Signed)
Injury happened 04/2018. Bruising has resolved. Residual leg knot.

## 2018-08-13 NOTE — Assessment & Plan Note (Signed)
Zetia recently started in addition to fish oil, statin, tricor - discussed possible vascepa in place of fish oil - asked to check with cards.

## 2018-08-13 NOTE — Assessment & Plan Note (Signed)
Well controlled on MWF PPI

## 2018-08-27 ENCOUNTER — Other Ambulatory Visit: Payer: Self-pay | Admitting: Family Medicine

## 2018-09-20 IMAGING — CR DG CHEST 2V
2 series · 2 of 2 positions shown · non-contrast
Comparison: 01/28/2018.

CLINICAL DATA: Recent CABG.  No complaints.

EXAM:
CHEST - 2 VIEW

[w chest pa]
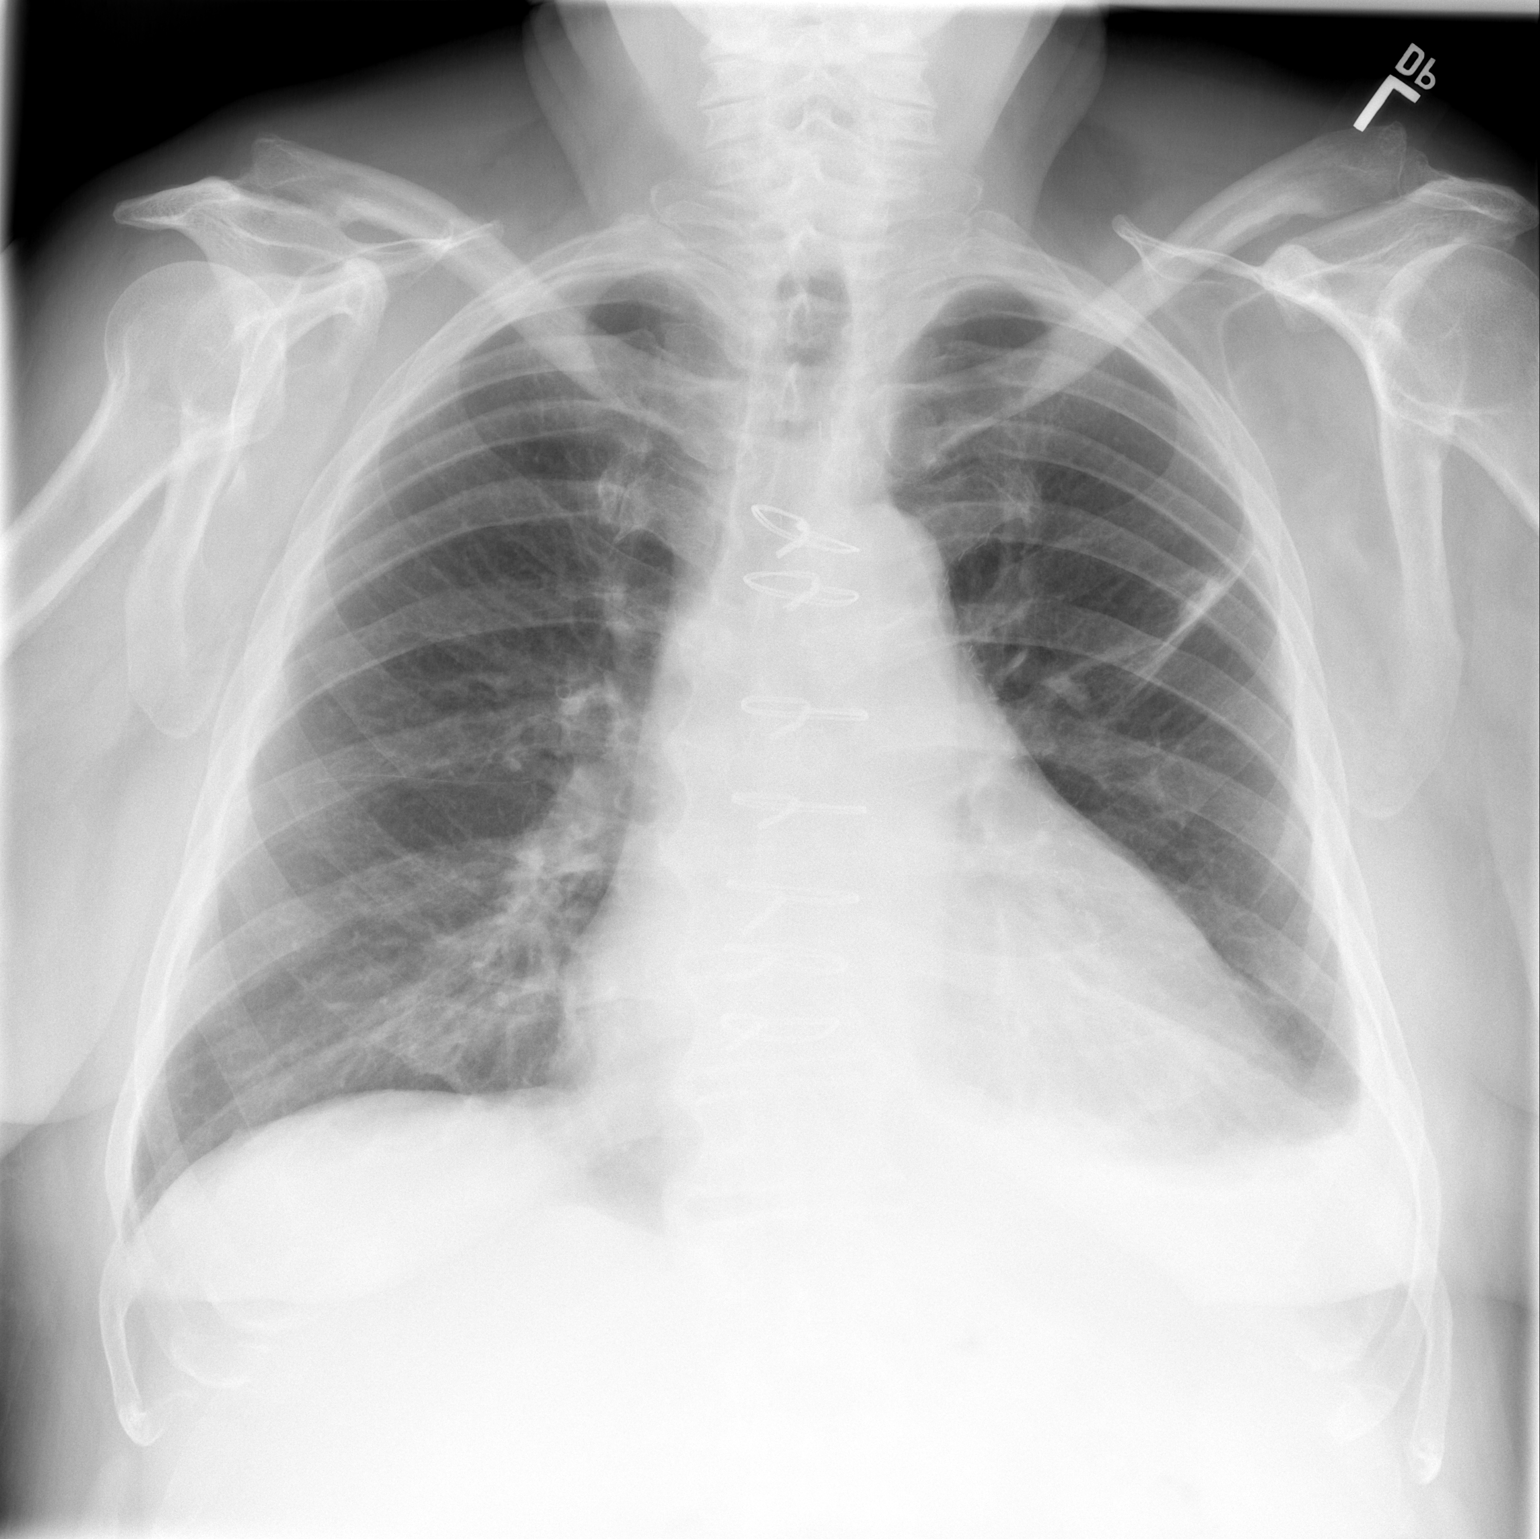

[w chest lat]
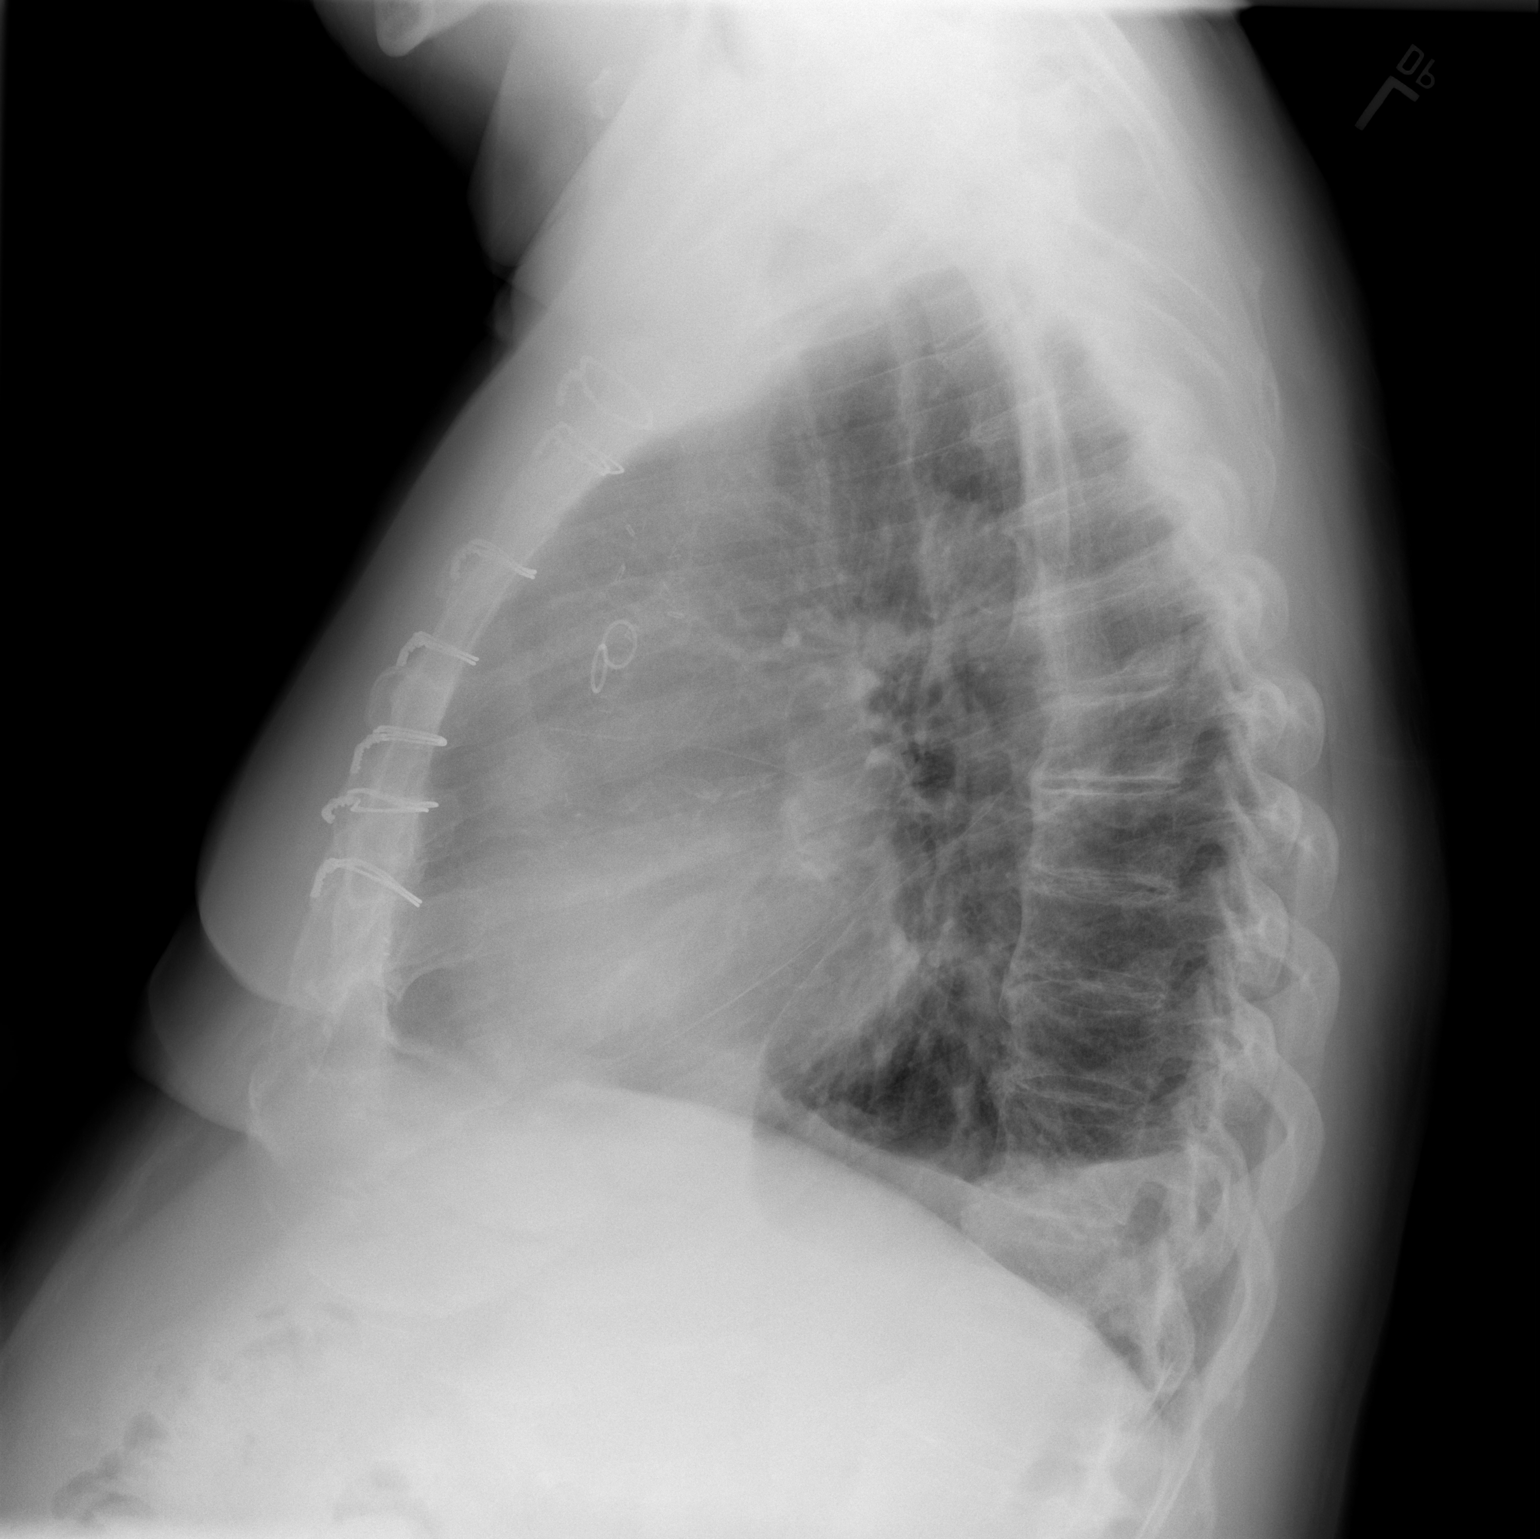

[2 of 2 positions shown; findings below may reference images not displayed]

FINDINGS: Increasing cardiac silhouette, probable baseline cardiomegaly.
Decreasing LEFT pleural effusion, now relatively minor. Platelike
atelectasis LEFT mid to upper lung zone is improved. No
consolidation or frank edema. No pneumothorax. Median sternotomy for
CABG. No thoracic vertebral body osseous findings.
IMPRESSION: Satisfactory postoperative CABG appearance. Mild LEFT pleural
effusion remains.

## 2018-09-23 ENCOUNTER — Ambulatory Visit: Payer: Medicare Other | Admitting: Internal Medicine

## 2018-09-23 ENCOUNTER — Encounter: Payer: Self-pay | Admitting: Internal Medicine

## 2018-09-23 VITALS — BP 158/72 | HR 61 | Ht 71.0 in | Wt 289.0 lb

## 2018-09-23 DIAGNOSIS — E785 Hyperlipidemia, unspecified: Secondary | ICD-10-CM

## 2018-09-23 DIAGNOSIS — I5032 Chronic diastolic (congestive) heart failure: Secondary | ICD-10-CM | POA: Diagnosis not present

## 2018-09-23 DIAGNOSIS — I2510 Atherosclerotic heart disease of native coronary artery without angina pectoris: Secondary | ICD-10-CM

## 2018-09-23 DIAGNOSIS — I10 Essential (primary) hypertension: Secondary | ICD-10-CM | POA: Diagnosis not present

## 2018-09-23 DIAGNOSIS — I4891 Unspecified atrial fibrillation: Secondary | ICD-10-CM

## 2018-09-23 DIAGNOSIS — I9789 Other postprocedural complications and disorders of the circulatory system, not elsewhere classified: Secondary | ICD-10-CM

## 2018-09-23 NOTE — Progress Notes (Signed)
Follow-up Outpatient Visit Date: 09/23/2018  Primary Care Provider: Ria Bush, MD [place] [id number]  Chief Complaint: Follow-up coronary artery disease  HPI:  [name] Young is a 78 y.o. year-old male with history of three-vessel CAD status post CABG (03/6159) complicated by postoperative atrial fibrillation, hypertension, hyperlipidemia, type 2 diabetes mellitus, and chronic kidney disease stage III, who presents for follow-up of CAD.  I last saw him in September, at which time his only complaints were of pain and bruising in the left lower leg after falling into a hole while pursuing a ground hog.  In an effort to consolidate his medication regimen, we transition from metoprolol tartrate to metoprolol succinate 25 mg daily.  We also agreed to stop clopidogrel in October after having completed 6 months of therapy (per instructions from Dr. Prescott Gum).  Today, [name] Young reports that he is feeling quite well from a heart standpoint.  He has not had any chest pain, shortness of breath, palpitations, or edema.  He notes that he occasionally feels off balance, particularly when walking on uneven surfaces.  He also fell a few months ago while bending over to pick collard greens.  He did not injure himself.  He does not check his blood pressure regularly at home.  He notes some difficulty sleeping over the last few weeks, which he attributes to "bizarre dreams."  He remains compliant with his medications.  --------------------------------------------------------------------------------------------------  Cardiovascular History & Procedures: Cardiovascular Problems:  Multivessel coronary artery disease  Risk Factors:  Hypertension, hyperlipidemia, type 2 diabetes mellitus, obesity, male gender, and age greater than 17  Cath/PCI:  LHC (12/21/17): LMCA normal. LAD with sequential 20% proximal and 70-80%mid LAD stenoses. Mid LAD lesion involves moderate-caliber  D1 branch, with 70% ostial and 90% mid stenoses Ladona Mow 0,1,1). LargeLCx with small OM1 with 80% stenosis and moderate OM2 with subtotal chronic occlusion and bridging collaterals, and chronic total occlusion of the proximal RCA with left-to-right collaterals filling the RPDA.  CV Surgery:  CABG (01/18/2018, Dr. Darcey Nora): LIMA to LAD, SVG to diagonal, and SVG to PL branch of RCA. LAD endarterectomy required for bypass grafting.  EP Procedures and Devices:  None  Non-Invasive Evaluation(s):  Exercise MPI (12/12/17): Intermediate risk study with moderate in size, moderate in severity basal and mid inferolateral reversible defect consistent with ischemia. LVEF mildly reduced at 51% with inferolateral hypokinesis. Poor exercise tolerance with frequent PVCs noted during stress.  TTE (11/23/17): Normal LV size with mild LVH. LVEF 50-55% with normal wall motion and grade 1 diastolic dysfunction. Mildly thickened and calcified mitral valve consistent with rheumatic disease. No evidence of stenosis. Trivial regurgitation. Normal RV size and function. Normal PA pressure.  Recent CV Pertinent Labs: Lab Results  Component Value Date   CHOL 153 07/11/2018   CHOL 178 02/01/2010   HDL 34 (L) 07/11/2018   LDLCALC 79 07/11/2018   LDLDIRECT 67.0 06/11/2017   TRIG 199 (H) 07/11/2018   TRIG 812 02/01/2010   CHOLHDL 4.5 07/11/2018   CHOLHDL 4 12/10/2017   INR 1.20 01/18/2018   K 4.7 05/14/2018   K 4.5 02/01/2010   MG 2.6 (H) 01/23/2018   BUN 25 (H) 05/14/2018   BUN 21 04/11/2018   CREATININE 1.59 (H) 05/14/2018   CREATININE 0.95 02/01/2010    Past medical and surgical history were reviewed and updated in EPIC.  Current Meds  Medication Sig  . ACCU-CHEK AVIVA PLUS test strip USE AS DIRECTED TO CHECK BLOOD SUGARS UPTO FOUR  TIMES DAILY.  Marland Kitchen aspirin EC 81 MG tablet Take 81 mg by mouth daily.  Marland Kitchen atorvastatin (LIPITOR) 80 MG tablet Take 1 tablet (80 mg total) by mouth daily.  . blood  glucose meter kit and supplies Dispense based on patient and insurance preference. Use up to four times daily as directed. (FOR ICD-10 E10.9, E11.9).  Marland Kitchen Cholecalciferol (VITAMIN D) 2000 UNITS CAPS Take 2,000 Units by mouth daily.   Marland Kitchen ezetimibe (ZETIA) 10 MG tablet Take 1 tablet (10 mg total) by mouth daily.  . fenofibrate (TRICOR) 145 MG tablet Take 1 tablet (145 mg total) by mouth at bedtime.  Marland Kitchen JANUVIA 50 MG tablet TAKE 1 TABLET BY MOUTH ONCE A DAY  . metFORMIN (GLUCOPHAGE) 500 MG tablet Take 1 tablet (500 mg total) by mouth daily with breakfast.  . metoprolol succinate (TOPROL-XL) 25 MG 24 hr tablet Take 1 tablet (25 mg total) by mouth daily. Take with or immediately following a meal.  . Multiple Vitamins-Minerals (MACULAR VITAMIN BENEFIT PO) Take 1 tablet by mouth daily.  . nitroGLYCERIN (NITROSTAT) 0.4 MG SL tablet Place 1 tablet (0.4 mg total) under the tongue every 5 (five) minutes as needed for chest pain.  . Omega-3 Fatty Acids (FISH OIL) 1200 MG CAPS Take 1 capsule (1,200 mg total) by mouth 2 (two) times daily.  Marland Kitchen omeprazole (PRILOSEC) 40 MG capsule Take 1 capsule (40 mg total) by mouth every Monday, Wednesday, and Friday.    Allergies: Patient has no known allergies.  Social History   Tobacco Use  . Smoking status: Never Smoker  . Smokeless tobacco: Former Systems developer    Types: Chew  Substance Use Topics  . Alcohol use: Yes    Comment: occ  . Drug use: No    Family History  Problem Relation Age of Onset  . Alzheimer's disease Mother   . Breast cancer Mother        breast  . Atrial fibrillation Mother   . CAD Mother   . Stroke Father 87  . Diabetes Father   . CAD Father   . Rectal cancer Maternal Grandfather        rectal  . Colon cancer Maternal Grandfather 50  . Esophageal cancer Neg Hx   . Stomach cancer Neg Hx     Review of Systems: A 12-system review of systems was performed and was negative except as noted in the  HPI.  --------------------------------------------------------------------------------------------------  Physical Exam: BP (!) 158/72 (BP Location: Left Arm, Patient Position: Sitting, Cuff Size: Normal)   Pulse 61   Ht 5' 11"  (1.803 m)   Wt 289 lb (131.1 kg)   BMI 40.31 kg/m   General: NAD.  Accompanied by his wife. HEENT: No conjunctival pallor or scleral icterus. Moist mucous membranes.  OP clear. Neck: Supple without lymphadenopathy, thyromegaly, JVD, or HJR. Lungs: Normal work of breathing. Clear to auscultation bilaterally without wheezes or crackles. Heart: Regular rate and rhythm without murmurs, rubs, or gallops. Non-displaced PMI.  Median sternotomy is well-healed. Abd: Bowel sounds present. Soft, NT/ND without hepatosplenomegaly Ext: Trace pretibial edema bilaterally. Skin: Warm and dry without rash.  EKG: Normal sinus rhythm with nonspecific ST/T changes.  Lab Results  Component Value Date   WBC 5.5 05/14/2018   HGB 11.2 (L) 05/14/2018   HCT 34.1 (L) 05/14/2018   MCV 89.8 05/14/2018   PLT 299.0 05/14/2018    Lab Results  Component Value Date   NA 141 05/14/2018   K 4.7 05/14/2018   CL 106 05/14/2018  CO2 27 05/14/2018   BUN 25 (H) 05/14/2018   CREATININE 1.59 (H) 05/14/2018   GLUCOSE 127 (H) 05/14/2018   ALT 28 07/11/2018    Lab Results  Component Value Date   CHOL 153 07/11/2018   HDL 34 (L) 07/11/2018   LDLCALC 79 07/11/2018   LDLDIRECT 67.0 06/11/2017   TRIG 199 (H) 07/11/2018   CHOLHDL 4.5 07/11/2018    --------------------------------------------------------------------------------------------------  ASSESSMENT AND PLAN: Coronary artery disease No angina reported.  [name] Young has recovered well from his CABG in April.  Continue indefinite low-dose aspirin and high intensity statin therapy plus ezetimibe.  HFpEF No symptoms of decompensated heart failure.  Only trace pedal edema noted on exam today.  Continue current  medications.  Postoperative atrial fibrillation EKG today demonstrates normal sinus rhythm.  [name] Young has not had any symptoms to suggest recurrence.  Continue metoprolol succinate 25 mg daily.  We will continue to defer anticoagulation, given self-limited nature of postoperative atrial fibrillation..  Hyperlipidemia Patient currently on atorvastatin 80 mg daily, fenofibrate, ezetimibe, and fish oil.  We discussed switching fish oil to the seatbelt, but have agreed to defer this due to cost.  We will plan to repeat a fasting lipid panel and ALT at the patient's convenience, given addition of ezetimibe in November.  Hypertension Blood pressure moderately elevated today, though typically better at prior visits.  [name] Young attributes some of this to stress and dietary indiscretion earlier today.  I have encouraged him to check his blood pressure at home.  We will defer medication changes at this time.  Disequilibrium Physical exam today is without focal neurologic deficits.  I wonder if altered sensation, possibly due to diabetic neuropathy may be contributing to his gait instability on uneven surfaces.  If symptoms persist or worsen, [name] Young should speak with his PCP.  Follow-up: Return to clinic in 3 months.  Nelva Bush, MD 09/23/2018 3:24 PM

## 2018-09-23 NOTE — Patient Instructions (Signed)
Medication Instructions:  Your physician recommends that you continue on your current medications as directed. Please refer to the Current Medication list given to you today. If you need a refill on your cardiac medications before your next appointment, please call your pharmacy.   Lab work: Your physician recommends that you return for lab work in: TODAY - LIPID, ALT. - You will need to be fasting. - Please go to the [facility]. You will check in at the front desk to the right as you walk into the atrium. Valet Parking is offered if needed.   If you have labs (blood work) drawn today and your tests are completely normal, you will receive your results only by: Marland Kitchen MyChart Message (if you have MyChart) OR . A paper copy in the mail If you have any lab test that is abnormal or we need to change your treatment, we will call you to review the results.  Testing/Procedures: none  Follow-Up: At [facility], you and your health needs are our priority.  As part of our continuing mission to provide you with exceptional heart care, we have created designated Provider Care Teams.  These Care Teams include your primary Cardiologist (physician) and Advanced Practice Providers (APPs -  Physician Assistants and Nurse Practitioners) who all work together to provide you with the care you need, when you need it. You will need a follow up appointment in 3 months.  Please call our office 2 months in advance to schedule this appointment.  You may see Nelva Bush, MD or one of the following Advanced Practice Providers on your designated Care Team:   Murray Hodgkins, NP Christell Faith, PA-C . Marrianne Mood, PA-C    DASH Eating Plan DASH stands for "Dietary Approaches to Stop Hypertension." The DASH eating plan is a healthy eating plan that has been shown to reduce high blood pressure (hypertension). It may also reduce your risk for type 2 diabetes, heart disease, and stroke. The DASH eating plan may  also help with weight loss. What are tips for following this plan?  General guidelines  Avoid eating more than 2,300 mg (milligrams) of salt (sodium) a day. If you have hypertension, you may need to reduce your sodium intake to 1,500 mg a day.  Limit alcohol intake to no more than 1 drink a day for nonpregnant women and 2 drinks a day for men. One drink equals 12 oz of beer, 5 oz of wine, or 1 oz of hard liquor.  Work with your health care provider to maintain a healthy body weight or to lose weight. Ask what an ideal weight is for you.  Get at least 30 minutes of exercise that causes your heart to beat faster (aerobic exercise) most days of the week. Activities may include walking, swimming, or biking.  Work with your health care provider or diet and nutrition specialist (dietitian) to adjust your eating plan to your individual calorie needs. Reading food labels   Check food labels for the amount of sodium per serving. Choose foods with less than 5 percent of the Daily Value of sodium. Generally, foods with less than 300 mg of sodium per serving fit into this eating plan.  To find whole grains, look for the word "whole" as the first word in the ingredient list. Shopping  Buy products labeled as "low-sodium" or "no salt added."  Buy fresh foods. Avoid canned foods and premade or frozen meals. Cooking  Avoid adding salt when cooking. Use salt-free seasonings or  herbs instead of table salt or sea salt. Check with your health care provider or pharmacist before using salt substitutes.  Do not fry foods. Cook foods using healthy methods such as baking, boiling, grilling, and broiling instead.  Cook with heart-healthy oils, such as olive, canola, soybean, or sunflower oil. Meal planning  Eat a balanced diet that includes: ? 5 or more servings of fruits and vegetables each day. At each meal, try to fill half of your plate with fruits and vegetables. ? Up to 6-8 servings of whole grains  each day. ? Less than 6 oz of lean meat, poultry, or fish each day. A 3-oz serving of meat is about the same size as a deck of cards. One egg equals 1 oz. ? 2 servings of low-fat dairy each day. ? A serving of nuts, seeds, or beans 5 times each week. ? Heart-healthy fats. Healthy fats called Omega-3 fatty acids are found in foods such as flaxseeds and coldwater fish, like sardines, salmon, and mackerel.  Limit how much you eat of the following: ? Canned or prepackaged foods. ? Food that is high in trans fat, such as fried foods. ? Food that is high in saturated fat, such as fatty meat. ? Sweets, desserts, sugary drinks, and other foods with added sugar. ? Full-fat dairy products.  Do not salt foods before eating.  Try to eat at least 2 vegetarian meals each week.  Eat more home-cooked food and less restaurant, buffet, and fast food.  When eating at a restaurant, ask that your food be prepared with less salt or no salt, if possible. What foods are recommended? The items listed may not be a complete list. Talk with your dietitian about what dietary choices are best for you. Grains Whole-grain or whole-wheat bread. Whole-grain or whole-wheat pasta. Brown rice. [place]. Bulgur. Whole-grain and low-sodium cereals. Pita bread. Low-fat, low-sodium crackers. Whole-wheat flour tortillas. Vegetables Fresh or frozen vegetables (raw, steamed, roasted, or grilled). Low-sodium or reduced-sodium tomato and vegetable juice. Low-sodium or reduced-sodium tomato sauce and tomato paste. Low-sodium or reduced-sodium canned vegetables. Fruits All fresh, dried, or frozen fruit. Canned fruit in natural juice (without added sugar). Meat and other protein foods Skinless chicken or [place]. Ground chicken or [place]. Pork with fat trimmed off. Fish and seafood. Egg whites. Dried beans, peas, or lentils. Unsalted nuts, nut butters, and seeds. Unsalted canned beans. Lean cuts of beef with fat trimmed off.  Low-sodium, lean deli meat. Dairy Low-fat (1%) or fat-free (skim) milk. Fat-free, low-fat, or reduced-fat cheeses. Nonfat, low-sodium ricotta or cottage cheese. Low-fat or nonfat yogurt. Low-fat, low-sodium cheese. Fats and oils Soft margarine without trans fats. Vegetable oil. Low-fat, reduced-fat, or light mayonnaise and salad dressings (reduced-sodium). Canola, safflower, olive, soybean, and sunflower oils. Avocado. Seasoning and other foods Herbs. Spices. Seasoning mixes without salt. Unsalted popcorn and pretzels. Fat-free sweets. What foods are not recommended? The items listed may not be a complete list. Talk with your dietitian about what dietary choices are best for you. Grains Baked goods made with fat, such as croissants, muffins, or some breads. Dry pasta or rice meal packs. Vegetables Creamed or fried vegetables. Vegetables in a cheese sauce. Regular canned vegetables (not low-sodium or reduced-sodium). Regular canned tomato sauce and paste (not low-sodium or reduced-sodium). Regular tomato and vegetable juice (not low-sodium or reduced-sodium). Angie Fava. Olives. Fruits Canned fruit in a light or heavy syrup. Fried fruit. Fruit in cream or butter sauce. Meat and other protein foods Fatty cuts of meat. Ribs.  Fried meat. Berniece Salines. Sausage. Bologna and other processed lunch meats. Salami. Fatback. Hotdogs. Bratwurst. Salted nuts and seeds. Canned beans with added salt. Canned or smoked fish. Whole eggs or egg yolks. Chicken or [place] with skin. Dairy Whole or 2% milk, cream, and half-and-half. Whole or full-fat cream cheese. Whole-fat or sweetened yogurt. Full-fat cheese. Nondairy creamers. Whipped toppings. Processed cheese and cheese spreads. Fats and oils Butter. Stick margarine. Lard. Shortening. Ghee. Bacon fat. Tropical oils, such as coconut, palm kernel, or palm oil. Seasoning and other foods Salted popcorn and pretzels. Onion salt, garlic salt, seasoned salt, table salt, and sea  salt. Worcestershire sauce. Tartar sauce. Barbecue sauce. Teriyaki sauce. Soy sauce, including reduced-sodium. Steak sauce. Canned and packaged gravies. Fish sauce. Oyster sauce. Cocktail sauce. Horseradish that you find on the shelf. Ketchup. Mustard. Meat flavorings and tenderizers. Bouillon cubes. Hot sauce and Tabasco sauce. Premade or packaged marinades. Premade or packaged taco seasonings. Relishes. Regular salad dressings. Where to find more information:  National Heart, Lung, and [place]: https://wilson-eaton.com/  American Heart Association: www.heart.org Summary  The DASH eating plan is a healthy eating plan that has been shown to reduce high blood pressure (hypertension). It may also reduce your risk for type 2 diabetes, heart disease, and stroke.  With the DASH eating plan, you should limit salt (sodium) intake to 2,300 mg a day. If you have hypertension, you may need to reduce your sodium intake to 1,500 mg a day.  When on the DASH eating plan, aim to eat more fresh fruits and vegetables, whole grains, lean proteins, low-fat dairy, and heart-healthy fats.  Work with your health care provider or diet and nutrition specialist (dietitian) to adjust your eating plan to your individual calorie needs. This information is not intended to replace advice given to you by your health care provider. Make sure you discuss any questions you have with your health care provider. Document Released: 08/17/2011 Document Revised: 08/21/2016 Document Reviewed: 08/21/2016 Elsevier Interactive Patient Education  2019 [employer].

## 2018-09-24 ENCOUNTER — Encounter: Payer: Self-pay | Admitting: Internal Medicine

## 2018-09-25 ENCOUNTER — Other Ambulatory Visit
Admission: RE | Admit: 2018-09-25 | Discharge: 2018-09-25 | Disposition: A | Discharge status: Home / Self Care | Payer: Medicare Other | Source: Ambulatory Visit | Attending: Internal Medicine | Admitting: Internal Medicine

## 2018-09-25 DIAGNOSIS — E785 Hyperlipidemia, unspecified: Secondary | ICD-10-CM | POA: Insufficient documentation

## 2018-09-25 LAB — ALT: ALT: 33 U/L (ref 0–44)

## 2018-09-25 LAB — LIPID PANEL
Cholesterol: 168 mg/dL (ref 0–200)
HDL: 38 mg/dL — ABNORMAL LOW (ref >40)
LDL Cholesterol: 82 mg/dL (ref 0–99)
Total CHOL/HDL Ratio: 4.4 RATIO
Triglycerides: 239 mg/dL — ABNORMAL HIGH (ref <150)
VLDL: 48 mg/dL — ABNORMAL HIGH (ref 0–40)

## 2018-09-26 ENCOUNTER — Telehealth: Payer: Self-pay | Admitting: *Deleted

## 2018-09-26 DIAGNOSIS — E785 Hyperlipidemia, unspecified: Secondary | ICD-10-CM

## 2018-09-26 NOTE — Telephone Encounter (Signed)
No answer. Left message to call back.   

## 2018-09-26 NOTE — Telephone Encounter (Signed)
-----   Message from Nelva Bush, MD sent at 09/25/2018  7:26 AM EST ----- Please let [name] Young know that his lipids remain suboptimally controlled with elevated LDL (goal < 70) and triglycerides.  Given that he is on max statin therapy + ezetimibe, I recommend that he be seen in the lipid clinic to discuss addition of PCSK9 inhibitor +/- Vascepa.

## 2018-09-26 NOTE — Telephone Encounter (Signed)
Patient's wife calling back, ok per DPR. She verbalized understanding of results and is agreeable to the lipid clinic.  Referral placed.  Message sent to scheduling.

## 2018-10-10 ENCOUNTER — Encounter: Payer: Self-pay | Admitting: Pharmacist

## 2018-10-10 ENCOUNTER — Ambulatory Visit (INDEPENDENT_AMBULATORY_CARE_PROVIDER_SITE_OTHER): Payer: Medicare Other | Admitting: Pharmacist

## 2018-10-10 DIAGNOSIS — E785 Hyperlipidemia, unspecified: Secondary | ICD-10-CM | POA: Diagnosis not present

## 2018-10-10 MED ORDER — ICOSAPENT ETHYL 1 G PO CAPS: 2.0000 | ORAL_CAPSULE | Freq: Two times a day (BID) | ORAL | 11 refills | Status: DC

## 2018-10-10 NOTE — Patient Instructions (Addendum)
Would recommend adding Repatha or Praluent  (PCSK9 inhibitor) to help lower LDL (bad cholesterol) and reduce your risk of having another cardiovascular event ie. Blockages, heart attack, stroke  Would recommend stopping over the counter fish oil since it is not regulated by FDA and could contain things that could elevate your other cholesterol number. Could consider Vascepa which is a prescription fish oil with good cardiovascular benefits.   Would recommend stating Repatha over Vascepa if you did not want to start both.   Limit carbohydrates, sweets, fatty foods  Call us at [phone] with any questions or concerns

## 2018-10-10 NOTE — Progress Notes (Signed)
Patient ID: [name] Young.                 DOB: 10-15-1940                    MRN: [id number]     HPI: [name] Young. is a 78 y.o. male patient referred to lipid clinic by Dr. Saunders Revel. PMH is significant for three-vessel CAD status post CABG (01/2018)complicated by postoperative atrial fibrillation, hypertension, hyperlipidemia, type 2 diabetes mellitus, and chronic kidney disease stage III.  Patient presents today accompanied by his wife. Wife expressed a disinterest in starting medications from the beginning of the visit. However they did listen to my explanation of the benefits of his current medications and the benefits of the new medications I was recommending.   Patient admits his diet has not been the best over the holidays and he has not been as active outside as he wants to be because of the cold. He is not going for walks like he was doing after his CABG.  Had gained some weight. States his blood sugars are well controlled. Last A1C about a month ago was 5.8.  We did call pharmacy for cost of Vascepea- it was around $60. Repatha requires a PA so cannot find out exact price until PA is approved.  Current Medications: atorvastatin 50m daily, fenofibrate 1414mdaily, fish oil 120034mwice a day, ezetimibe 10m38mily Risk Factors: CAD LDL goal: <70  Diet: doesn't eat many carbs (breads/pastas) does eat a lot of fruit, some sweets. No milk, eats cottage cheese  Exercise: hasn't been able to be as active as he wants to be because of the cold weather  Social History: - ETOH  Labs:09/25/2018 TC 168, TG 239, HDL 38, LDL 82  Past Medical History:  Diagnosis Date  . Arthritis   . Chronic kidney disease    kidney stones  . Coronary artery disease   . Dyspnea   . Dysrhythmia   . GERD (gastroesophageal reflux disease)   . History of cholelithiasis   . History of kidney stones    [place]   . HLD (hyperlipidemia)   . HTN  (hypertension)   . Jaundice    age 44  76Pneumonia    years ago   . T2DM (type 2 diabetes mellitus) (HCC)[place]10    Current Outpatient Medications on File Prior to Visit  Medication Sig Dispense Refill  . ACCU-CHEK AVIVA PLUS test strip USE AS DIRECTED TO CHECK BLOOD SUGARS UPTO FOUR TIMES DAILY. 100 each 2  . aspirin EC 81 MG tablet Take 81 mg by mouth daily.    . atMarland Kitchenrvastatin (LIPITOR) 80 MG tablet Take 1 tablet (80 mg total) by mouth daily. 90 tablet 3  . blood glucose meter kit and supplies Dispense based on patient and insurance preference. Use up to four times daily as directed. (FOR ICD-10 E10.9, E11.9). 1 each 0  . Cholecalciferol (VITAMIN D) 2000 UNITS CAPS Take 2,000 Units by mouth daily.     . ezMarland Kitchentimibe (ZETIA) 10 MG tablet Take 1 tablet (10 mg total) by mouth daily. 90 tablet 3  . fenofibrate (TRICOR) 145 MG tablet Take 1 tablet (145 mg total) by mouth at bedtime. 90 tablet 3  . JANUVIA 50 MG tablet TAKE 1 TABLET BY MOUTH ONCE A DAY 90 tablet 1  . metFORMIN (GLUCOPHAGE) 500 MG tablet Take 1 tablet (500 mg  total) by mouth daily with breakfast.    . metoprolol succinate (TOPROL-XL) 25 MG 24 hr tablet Take 1 tablet (25 mg total) by mouth daily. Take with or immediately following a meal. 90 tablet 3  . Multiple Vitamins-Minerals (MACULAR VITAMIN BENEFIT PO) Take 1 tablet by mouth daily.    . nitroGLYCERIN (NITROSTAT) 0.4 MG SL tablet Place 1 tablet (0.4 mg total) under the tongue every 5 (five) minutes as needed for chest pain. 30 tablet prn  . Omega-3 Fatty Acids (FISH OIL) 1200 MG CAPS Take 1 capsule (1,200 mg total) by mouth 2 (two) times daily.    Marland Kitchen omeprazole (PRILOSEC) 40 MG capsule Take 1 capsule (40 mg total) by mouth every Monday, Wednesday, and Friday.     No current facility-administered medications on file prior to visit.     No Known Allergies  Assessment/Plan:  1. Hyperlipidemia - LDL is above goal of <70, TG >150 (although significantly lower than in the past).  Patient and wife are not interested in adding any medication at this time. I reviewed the benefits of both Repatha (PCSK9 inhibitors in general) and Vascepea. Reviewed injection technique and side effects. I recommended that if they were only to want to start one medication, I would recommend Repatha as I feel it would provide better benefit to patient to lower LDL to goal than TG (although I do feel they both provide very good cardiovascular protection beyond their cholesterol number lowering. I also recommended that he stop over the counter fish oil as I do not believe the patient is getting any benefit from this. Patient and wife appear to be in agreement.  I wrote out all my recommendations for patient per their request so that they might discuss with their PCP or Dr. Saunders Revel. Patient wishes to try life style modifications and recheck his lipids in April. He has both a PCP physical and appointment with Dr. Saunders Revel in April. If his levels are high and Dr. Saunders Revel wants him to come back to see [place] they will per patients wife. Provided them with our phone number if they change their mind.   Thank you,  Ramond Dial, Pharm.D, [place]  [address], [place], [place] [address]  Phone: [phone]; Fax: [phone]

## 2018-12-16 ENCOUNTER — Telehealth: Payer: Self-pay | Admitting: *Deleted

## 2018-12-16 NOTE — Telephone Encounter (Signed)
Called and spoke with patient and wife. They agreed to virtual visit. I talked through the process of WEbEx. I also spoke with their granddaughter to help with downloading the Webex app. She said it was taking a while to download. Not sure if Webex will work on smartphone or not. However, they do agree to telephone visit as well.  They verbalized understanding of the following:     Virtual Visit Pre-Appointment Phone Call  Steps For Call:  1. Confirm consent - "In the setting of the current Covid19 crisis, you are scheduled for a (phone or video) visit with your provider on (date) at (time).  Just as we do with many in-office visits, in order for you to participate in this visit, we must obtain consent.  If you'd like, I can send this to your mychart (if signed up) or email for you to review.  Otherwise, I can obtain your verbal consent now.  All virtual visits are billed to your insurance company just like a normal visit would be.  By agreeing to a virtual visit, we'd like you to understand that the technology does not allow for your provider to perform an examination, and thus may limit your provider's ability to fully assess your condition.  Finally, though the technology is pretty good, we cannot assure that it will always work on either your or our end, and in the setting of a video visit, we may have to convert it to a phone-only visit.  In either situation, we cannot ensure that we have a secure connection.  Are you willing to proceed?"  2. Give patient instructions for WebEx download to smartphone as below if video visit  3. Advise patient to be prepared with any vital sign or heart rhythm information, their current medicines, and a piece of paper and pen handy for any instructions they may receive the day of their visit  4. Inform patient they will receive a phone call 15 minutes prior to their appointment time (may be from unknown caller ID) so they should be prepared to answer  5.  Confirm that appointment type is correct in Epic appointment notes (video vs telephone)    TELEPHONE CALL NOTE  [name] Young. has been deemed a candidate for a follow-up tele-health visit to limit community exposure during the Covid-19 pandemic. I spoke with the patient via phone to ensure availability of phone/video source, confirm preferred email & phone number, and discuss instructions and expectations.  I reminded Dae Highley. to be prepared with any vital sign and/or heart rhythm information that could potentially be obtained via home monitoring, at the time of his visit. I reminded Quanah Majka. to expect a phone call at the time of his visit if his visit.  Did the patient verbally acknowledge consent to treatment? yes  Roney Jaffe, RN 12/16/2018 1:29 PM   DOWNLOADING THE [employer]ap TO SMARTPHONE  - If Apple, go to [employer] and type in WebEx in the search bar. [place] [employer], the blue/green circle. The app is free but as with any other app downloads, their phone may require them to verify saved payment information or Apple password. The patient does NOT have to create an account.  - If Android, ask patient to go to [employer] and type in WebEx in the search bar. [place] [employer], the blue/green circle. The app is free but as with any other app downloads, their phone may require  them to verify saved payment information or Android password. The patient does NOT have to create an account.   CONSENT FOR TELE-HEALTH VISIT - PLEASE REVIEW  I hereby voluntarily request, consent and authorize [facility] and its employed or contracted physicians, physician assistants, nurse practitioners or other licensed health care professionals (the Practitioner), to provide me with telemedicine health care services (the "Services") as deemed necessary by the treating Practitioner. I acknowledge and consent to receive the Services by  the Practitioner via telemedicine. I understand that the telemedicine visit will involve communicating with the Practitioner through live audiovisual communication technology and the disclosure of certain medical information by electronic transmission. I acknowledge that I have been given the opportunity to request an in-person assessment or other available alternative prior to the telemedicine visit and am voluntarily participating in the telemedicine visit.  I understand that I have the right to withhold or withdraw my consent to the use of telemedicine in the course of my care at any time, without affecting my right to future care or treatment, and that the Practitioner or I may terminate the telemedicine visit at any time. I understand that I have the right to inspect all information obtained and/or recorded in the course of the telemedicine visit and may receive copies of available information for a reasonable fee.  I understand that some of the potential risks of receiving the Services via telemedicine include:  Marland Kitchen Delay or interruption in medical evaluation due to technological equipment failure or disruption; . Information transmitted may not be sufficient (e.g. poor resolution of images) to allow for appropriate medical decision making by the Practitioner; and/or  . In rare instances, security protocols could fail, causing a breach of personal health information.  Furthermore, I acknowledge that it is my responsibility to provide information about my medical history, conditions and care that is complete and accurate to the best of my ability. I acknowledge that Practitioner's advice, recommendations, and/or decision may be based on factors not within their control, such as incomplete or inaccurate data provided by me or distortions of diagnostic images or specimens that may result from electronic transmissions. I understand that the practice of medicine is not an exact science and that Practitioner  makes no warranties or guarantees regarding treatment outcomes. I acknowledge that I will receive a copy of this consent concurrently upon execution via email to the email address I last provided but may also request a printed copy by calling the office of [place].    I understand that my insurance will be billed for this visit.   I have read or had this consent read to me. . I understand the contents of this consent, which adequately explains the benefits and risks of the Services being provided via telemedicine.  . I have been provided ample opportunity to ask questions regarding this consent and the Services and have had my questions answered to my satisfaction. . I give my informed consent for the services to be provided through the use of telemedicine in my medical care  By participating in this telemedicine visit I agree to the above.

## 2018-12-19 ENCOUNTER — Ambulatory Visit: Payer: Medicare Other

## 2018-12-23 NOTE — Progress Notes (Signed)
Virtual Visit via Video Note   This visit type was conducted due to national recommendations for restrictions regarding the COVID-19 Pandemic (e.g. social distancing) in an effort to limit this patient's exposure and mitigate transmission in our community.  Due to his co-morbid illnesses, this patient is at least at moderate risk for complications without adequate follow up.  This format is felt to be most appropriate for this patient at this time.  All issues noted in this document were discussed and addressed.  A limited physical exam was performed with this format.  Please refer to the patient's chart for his consent to telehealth for [facility].   Evaluation Performed:  Follow-up visit  Date:  12/25/2018   ID:  [name] Young., DOB 02/18/1941, MRN [id number]  Patient Location: Home  Provider Location: Office  PCP:  Ria Bush, MD  Cardiologist:  Nelva Bush, MD  Electrophysiologist:  None   Chief Complaint:  Buttock pain  History of Present Illness:    [name] Young. is a 78 y.o. male who presents via audio/video conferencing for a telehealth visit today.  He has a history of three-vessel CAD status post CABG (01/2018)complicated by postoperative atrial fibrillation, hypertension, hyperlipidemia, type 2 diabetes mellitus, and chronic kidney disease stage III.  We are speaking today for follow-up of his coronary artery disease.  I last saw him in January, at which time he was doing well other than occasional balance problems.  Due to suboptimal lipid control (LDL 82, triglycerides 239) on atorvastatin 80 mg daily, ezetimibe 10 mg daily, and fenofibrate 145 mg daily, I referred [name] Young to the lipid clinic to discuss PCSK9 inhibitor therapy +/- Vesepa.  Patient and his wife did not wish to add any medication after meeting with the pharmacy team.  Today, [name] Young complains primarily of bilateral buttock pain that has been present since shortly after our  last visit in January.  It occurs with minimal activity, such as walking 100 yards to his shop or pulling objects in the yard.  Pain resolves promptly with rest.  It does not radiate.  Other than chronic kneed pain, he does not have any pain in his lower extremities, including thigh or calf pain with activity.  [name]' wife has also noticed that her husband seems more tired and sits around all day.  [name] Young denies chest pain, shortness of breath, palpitations, lightheadedness, and edema.  He is compliant with his medications.  Home BP is usually 110-130/70-80 mmHg.  The patient does not have symptoms concerning for COVID-19 infection (fever, chills, cough, or new shortness of breath).    Past Medical History:  Diagnosis Date  . Arthritis   . Chronic kidney disease    kidney stones  . Coronary artery disease   . Dyspnea   . Dysrhythmia   . GERD (gastroesophageal reflux disease)   . History of cholelithiasis   . History of kidney stones    ca ox [name] Young @ [facility]) now [place]  . History of pneumonia   . HLD (hyperlipidemia)   . HTN (hypertension)   . Jaundice    age 81  . Pneumonia    years ago   . T2DM (type 2 diabetes mellitus) ([place]) 2010   Past Surgical History:  Procedure Laterality Date  . CARPAL TUNNEL RELEASE Bilateral   . CATARACT EXTRACTION, BILATERAL    . COLONOSCOPY  11/2012   11 adenomatous polyps, diverticulosis, rec rpt 1 yr Ardis Hughs)  . COLONOSCOPY  12/2013   3 polyps, diverticulosis, rec rpt 3 yrs Ardis Hughs)  . CORONARY ARTERY BYPASS GRAFT N/A 01/18/2018   Procedure: CORONARY ARTERY BYPASS GRAFTING (CABG) x 3; Using Left Internal Mammary Artery, and Right Greater Saphenous Vein harvested Endoscopically, Coronary Artery Endarterectomy;  Surgeon: Ivin Poot, MD;  Location: [place];  Service: Open Heart Surgery;  Laterality: N/A;  . KNEE CARTILAGE SURGERY Left   . LEFT HEART CATH AND CORONARY ANGIOGRAPHY N/A 12/21/2017   Procedure: LEFT HEART CATH AND  CORONARY ANGIOGRAPHY;  Surgeon: Nelva Bush, MD;  Location: [place] [facility];  Service: Cardiovascular;  Laterality: N/A;  . LITHOTRIPSY    . TEE WITHOUT CARDIOVERSION N/A 01/18/2018   Procedure: TRANSESOPHAGEAL ECHOCARDIOGRAM (TEE);  Surgeon: Prescott Gum, Collier Salina, MD;  Location: [place];  Service: Open Heart Surgery;  Laterality: N/A;  . UMBILICAL HERNIA REPAIR     with mesh     Current Meds  Medication Sig  . ACCU-CHEK AVIVA PLUS test strip USE AS DIRECTED TO CHECK BLOOD SUGARS UPTO FOUR TIMES DAILY.  Marland Kitchen aspirin EC 81 MG tablet Take 81 mg by mouth daily.  Marland Kitchen atorvastatin (LIPITOR) 80 MG tablet Take 1 tablet (80 mg total) by mouth daily.  . blood glucose meter kit and supplies Dispense based on patient and insurance preference. Use up to four times daily as directed. (FOR ICD-10 E10.9, E11.9).  Marland Kitchen Cholecalciferol (VITAMIN D) 2000 UNITS CAPS Take 2,000 Units by mouth daily.   Marland Kitchen ezetimibe (ZETIA) 10 MG tablet Take 1 tablet (10 mg total) by mouth daily.  . fenofibrate (TRICOR) 145 MG tablet Take 1 tablet (145 mg total) by mouth at bedtime.  Marland Kitchen JANUVIA 50 MG tablet TAKE 1 TABLET BY MOUTH ONCE A DAY  . metFORMIN (GLUCOPHAGE) 500 MG tablet Take 1 tablet (500 mg total) by mouth daily with breakfast.  . metoprolol succinate (TOPROL-XL) 25 MG 24 hr tablet Take 1 tablet (25 mg total) by mouth daily. Take with or immediately following a meal.  . Multiple Vitamins-Minerals (MACULAR VITAMIN BENEFIT PO) Take 1 tablet by mouth daily.  . nitroGLYCERIN (NITROSTAT) 0.4 MG SL tablet Place 1 tablet (0.4 mg total) under the tongue every 5 (five) minutes as needed for chest pain.  Marland Kitchen omeprazole (PRILOSEC) 40 MG capsule Take 1 capsule (40 mg total) by mouth every Monday, Wednesday, and Friday.     Allergies:   Patient has no known allergies.   Social History   Tobacco Use  . Smoking status: Never Smoker  . Smokeless tobacco: Former Systems developer    Types: Chew  Substance Use Topics  . Alcohol use: Yes    Comment: occ   . Drug use: No     Family Hx: The patient's family history includes Alzheimer's disease in his mother; Atrial fibrillation in his mother; Breast cancer in his mother; CAD in his father and mother; Colon cancer (age of onset: 85) in his maternal grandfather; Diabetes in his father; Rectal cancer in his maternal grandfather; Stroke (age of onset: 85) in his father. There is no history of Esophageal cancer or Stomach cancer.  ROS:   Please see the history of present illness.   All other systems reviewed and are negative.   Prior CV studies:   The following studies were reviewed today:  Cath/PCI:  LHC (12/21/17): LMCA normal. LAD with sequential 20% proximal and 70-80%mid LAD stenoses. Mid LAD lesion involves moderate-caliber D1 branch, with 70% ostial and 90% mid stenoses Ladona Mow 0,1,1). LargeLCx with small OM1 with 80%  stenosis and moderate OM2 with subtotal chronic occlusion and bridging collaterals, and chronic total occlusion of the proximal RCA with left-to-right collaterals filling the RPDA.  CV Surgery:  CABG (01/18/2018, Dr. Darcey Nora): LIMA to LAD, SVG to diagonal, and SVG to PL branch of RCA. LAD endarterectomy required for bypass grafting.  Non-Invasive Evaluation(s):  Exercise MPI (12/12/17): Intermediate risk study with moderate in size, moderate in severity basal and mid inferolateral reversible defect consistent with ischemia. LVEF mildly reduced at 51% with inferolateral hypokinesis. Poor exercise tolerance with frequent PVCs noted during stress.  TTE (11/23/17): Normal LV size with mild LVH. LVEF 50-55% with normal wall motion and grade 1 diastolic dysfunction. Mildly thickened and calcified mitral valve consistent with rheumatic disease. No evidence of stenosis. Trivial regurgitation. Normal RV size and function. Normal PA pressure.  Labs/Other Tests and Data Reviewed:    EKG:  No ECG reviewed.  Recent Labs: 01/23/2018: Magnesium 2.6; TSH 5.764 05/14/2018: BUN  25; Creatinine, Ser 1.59; Hemoglobin 11.2; Platelets 299.0; Potassium 4.7; Sodium 141 09/25/2018: ALT 33   Recent Lipid Panel Lab Results  Component Value Date/Time   CHOL 168 09/25/2018 06:52 AM   CHOL 153 07/11/2018 07:59 AM   CHOL 178 02/01/2010   TRIG 239 (H) 09/25/2018 06:52 AM   TRIG 812 02/01/2010   HDL 38 (L) 09/25/2018 06:52 AM   HDL 34 (L) 07/11/2018 07:59 AM   CHOLHDL 4.4 09/25/2018 06:52 AM   LDLCALC 82 09/25/2018 06:52 AM   LDLCALC 79 07/11/2018 07:59 AM   LDLDIRECT 67.0 06/11/2017 07:52 AM    Wt Readings from Last 3 Encounters:  12/25/18 290 lb (131.5 kg)  09/23/18 289 lb (131.1 kg)  08/13/18 289 lb 12 oz (131.4 kg)     Objective:    Vital Signs:  BP (!) 142/82 (BP Location: Left Arm, Patient Position: Sitting, Cuff Size: Large)   Pulse 63   Ht 5' 10.5" (1.791 m)   Wt 290 lb (131.5 kg)   BMI 41.02 kg/m    Unable to see patient clearly due to technical difficulties with video feed.  ASSESSMENT & PLAN:    Coronary artery disease: No symptoms to suggest worsening coronary insufficiency.  We will hold all lipid therapy for a month (see details below).  Otherwise, continue current medications.  Buttock pain: Symptoms could be due to medication side effects (especially multiple lipid agents), vascular insufficiency (Leriche syndrome), or nerve/MSK etiology.  We have agreed to begin with discontinuation of atorvastatin, ezetemibe, and fenofibrate for 1 month.  If symptoms resolve, we will need to consider alternate lipid regimen in the future.  If pain persists, we will need to consider vascular imaging to exclude aortoiliac disease.  Of note, pre-CABG ABI's were normal.  Hyperlipidemia: Not optimally controlled on last check.  Patient deferred additional therapy at subsequent visit with the lipid clinic.  Given buttock pain, we have agreed to a holiday from all lipid therapy.  If symptoms resolve, we will need to consider careful reintroduction of lipid medications  (I would favor starting with statin first) or consider PCSK9 inhibitor.  Hypertension: BP mildly elevated today but typically normal at home.  No medication changes today.  COVID-19 Education: The signs and symptoms of COVID-19 were discussed with the patient and how to seek care for testing (follow up with PCP or arrange E-visit).  The importance of social distancing was discussed today.  Time:   Today, I have spent 15 minutes with the patient with telehealth technology discussing the above problems.  An additional 10 minutes were spent reviewing the patients chart and documenting the encounter.  Medication Adjustments/Labs and Tests Ordered: Current medicines are reviewed at length with the patient today.  Concerns regarding medicines are outlined above.  Tests Ordered: None.  Medication Changes: Hold atorvastatin, ezetimibe, and fenofibrate until f/u appointment in 1 month.  Disposition:  Follow up in 1 month(s) with virtual visit.  Signed, Nelva Bush, MD  12/25/2018 10:00 AM    [place]

## 2018-12-25 ENCOUNTER — Other Ambulatory Visit: Payer: Self-pay

## 2018-12-25 ENCOUNTER — Encounter: Payer: Self-pay | Admitting: Internal Medicine

## 2018-12-25 ENCOUNTER — Telehealth (INDEPENDENT_AMBULATORY_CARE_PROVIDER_SITE_OTHER): Payer: Medicare Other | Admitting: Internal Medicine

## 2018-12-25 VITALS — BP 142/82 | HR 63 | Ht 70.5 in | Wt 290.0 lb

## 2018-12-25 DIAGNOSIS — I2510 Atherosclerotic heart disease of native coronary artery without angina pectoris: Secondary | ICD-10-CM

## 2018-12-25 DIAGNOSIS — M7918 Myalgia, other site: Secondary | ICD-10-CM | POA: Diagnosis not present

## 2018-12-25 DIAGNOSIS — E785 Hyperlipidemia, unspecified: Secondary | ICD-10-CM | POA: Diagnosis not present

## 2018-12-25 DIAGNOSIS — I10 Essential (primary) hypertension: Secondary | ICD-10-CM | POA: Diagnosis not present

## 2018-12-25 DIAGNOSIS — Z7189 Other specified counseling: Secondary | ICD-10-CM

## 2018-12-25 NOTE — Patient Instructions (Addendum)
Medication Instructions:  Your physician has recommended you make the following change in your medication:  STOP taking Atorvastatin, Fenofibrate and Ezetimibe.   If you need a refill on your cardiac medications before your next appointment, please call your pharmacy.   Lab work: none If you have labs (blood work) drawn today and your tests are completely normal, you will receive your results only by: Marland Kitchen MyChart Message (if you have MyChart) OR . A paper copy in the mail If you have any lab test that is abnormal or we need to change your treatment, we will call you to review the results.  Testing/Procedures: none  Follow-Up: At [facility], you and your health needs are our priority.  As part of our continuing mission to provide you with exceptional heart care, we have created designated Provider Care Teams.  These Care Teams include your primary Cardiologist (physician) and Advanced Practice Providers (APPs -  Physician Assistants and Nurse Practitioners) who all work together to provide you with the care you need, when you need it. You will need a follow up appointment in 1 months Virtual Visit with Dr End.

## 2018-12-27 ENCOUNTER — Encounter: Payer: Medicare Other | Admitting: Family Medicine

## 2018-12-30 ENCOUNTER — Other Ambulatory Visit: Payer: Self-pay

## 2018-12-30 NOTE — Patient Outreach (Signed)
[facility]) Care Management  12/30/2018  [name] Young 1941-06-29 [id number]   Medication Adherence call to [name] Young [facility] Verify spoke with patients wife she explain patientt is taking 1 tablet daily and still has medication at this time patient will order when she is done with what she have. [name] Young is showing past due under [name] Young.   [facility] Direct Dial [phone]  Fax [phone] [email or web address]

## 2019-01-15 ENCOUNTER — Other Ambulatory Visit: Payer: Self-pay | Admitting: Family Medicine

## 2019-01-28 ENCOUNTER — Other Ambulatory Visit: Payer: Self-pay | Admitting: Family Medicine

## 2019-01-28 NOTE — Progress Notes (Signed)
Virtual Visit via Video Note   This visit type was conducted due to national recommendations for restrictions regarding the COVID-19 Pandemic (e.g. social distancing) in an effort to limit this patient's exposure and mitigate transmission in our community.  Due to his co-morbid illnesses, this patient is at least at moderate risk for complications without adequate follow up.  This format is felt to be most appropriate for this patient at this time.  All issues noted in this document were discussed and addressed.  A limited physical exam was performed with this format.  Please refer to the patient's chart for his consent to telehealth for [facility].   Date:  01/29/2019   ID:  [name] Luna., DOB Sep 25, 1940, MRN [id number]  Patient Location: Home Provider Location: Office  PCP:  Ria Bush, MD  Cardiologist:  Nelva Bush, MD  Electrophysiologist:  None   Evaluation Performed:  Follow-Up Visit  Chief Complaint: Follow-up buttock pain  History of Present Illness:    [name] Pawloski. is a 78 y.o. male with history of three-vessel CAD status post CABG (01/2018)complicated by postoperative atrial fibrillation, hypertension, hyperlipidemia, type 2 diabetes mellitus, and chronic kidney disease stage III.  We spoke a month ago, at which time [name] Young reported bilateral buttock pain that began in January.  The pain occurred with minimal activity and would improve with rest.  He also noted increased generalized fatigue.  We agreed to try a statin holiday and are speaking again today to reassess his symptoms.   Today, [name] Young reports that his buttock pain has improved considerably with discontinuation of atorvastatin.  He has been able to work in his yard and walk further distances without any symptoms.  He still notes some soreness in his back and buttocks when he "overdoes it."  He denies chest pain, shortness of breath, edema, and palpitations.  Blood pressure is  elevated today, which he [name] Young attributes to eating more pork.  He was on lisinopril in the past, though he believes this was stopped due to improvement in his blood pressure.  The patient does not have symptoms concerning for COVID-19 infection (fever, chills, cough, or new shortness of breath).    Past Medical History:  Diagnosis Date  . Arthritis   . Chronic kidney disease    kidney stones  . Coronary artery disease   . Dyspnea   . Dysrhythmia   . GERD (gastroesophageal reflux disease)   . History of cholelithiasis   . History of kidney stones    ca ox [name] @ [facility]) now [place]  . History of pneumonia   . HLD (hyperlipidemia)   . HTN (hypertension)   . Jaundice    age 74  . Pneumonia    years ago   . T2DM (type 2 diabetes mellitus) ([place]) 2010   Past Surgical History:  Procedure Laterality Date  . CARPAL TUNNEL RELEASE Bilateral   . CATARACT EXTRACTION, BILATERAL    . COLONOSCOPY  11/2012   11 adenomatous polyps, diverticulosis, rec rpt 1 yr Ardis Hughs)  . COLONOSCOPY  12/2013   3 polyps, diverticulosis, rec rpt 3 yrs Ardis Hughs)  . CORONARY ARTERY BYPASS GRAFT N/A 01/18/2018   Procedure: CORONARY ARTERY BYPASS GRAFTING (CABG) x 3; Using Left Internal Mammary Artery, and Right Greater Saphenous Vein harvested Endoscopically, Coronary Artery Endarterectomy;  Surgeon: Ivin Poot, MD;  Location: [facility];  Service: Open Heart Surgery;  Laterality: N/A;  . KNEE CARTILAGE SURGERY Left   . LEFT  HEART CATH AND CORONARY ANGIOGRAPHY N/A 12/21/2017   Procedure: LEFT HEART CATH AND CORONARY ANGIOGRAPHY;  Surgeon: Nelva Bush, MD;  Location: [place] [facility];  Service: Cardiovascular;  Laterality: N/A;  . LITHOTRIPSY    . TEE WITHOUT CARDIOVERSION N/A 01/18/2018   Procedure: TRANSESOPHAGEAL ECHOCARDIOGRAM (TEE);  Surgeon: Prescott Gum, Collier Salina, MD;  Location: [place];  Service: Open Heart Surgery;  Laterality: N/A;  . UMBILICAL HERNIA REPAIR     with mesh     Current Meds   Medication Sig  . ACCU-CHEK AVIVA PLUS test strip USE AS DIRECTED TO CHECK BLOOD SUGARS UPTO FOUR TIMES DAILY.  Marland Kitchen aspirin EC 81 MG tablet Take 81 mg by mouth daily.  . blood glucose meter kit and supplies Dispense based on patient and insurance preference. Use up to four times daily as directed. (FOR ICD-10 E10.9, E11.9).  Marland Kitchen Cholecalciferol (VITAMIN D) 2000 UNITS CAPS Take 2,000 Units by mouth daily.   Marland Kitchen JANUVIA 50 MG tablet TAKE 1 TABLET BY MOUTH ONCE DAILY  . metFORMIN (GLUCOPHAGE) 500 MG tablet Take 1 tablet (500 mg total) by mouth daily with breakfast.  . metoprolol succinate (TOPROL-XL) 25 MG 24 hr tablet Take 1 tablet (25 mg total) by mouth daily. Take with or immediately following a meal.  . Multiple Vitamins-Minerals (MACULAR VITAMIN BENEFIT PO) Take 1 tablet by mouth daily.  . nitroGLYCERIN (NITROSTAT) 0.4 MG SL tablet Place 1 tablet (0.4 mg total) under the tongue every 5 (five) minutes as needed for chest pain.  Marland Kitchen omeprazole (PRILOSEC) 40 MG capsule Take 1 (one) capsule by mouth every Monday, Wednesday and Friday     Allergies:   Patient has no known allergies.   Social History   Tobacco Use  . Smoking status: Never Smoker  . Smokeless tobacco: Former Systems developer    Types: Chew  Substance Use Topics  . Alcohol use: Yes    Comment: occ  . Drug use: No     Family Hx: The patient's family history includes Alzheimer's disease in his mother; Atrial fibrillation in his mother; Breast cancer in his mother; CAD in his father and mother; Colon cancer (age of onset: 54) in his maternal grandfather; Diabetes in his father; Rectal cancer in his maternal grandfather; Stroke (age of onset: 59) in his father. There is no history of Esophageal cancer or Stomach cancer.  ROS:   Please see the history of present illness.   All other systems reviewed and are negative.   Prior CV studies:   The following studies were reviewed today:  Cath/PCI:  LHC (12/21/17): LMCA normal. LAD with sequential  20% proximal and 70-80%mid LAD stenoses. Mid LAD lesion involves moderate-caliber D1 branch, with 70% ostial and 90% mid stenoses Ladona Mow 0,1,1). LargeLCx with small OM1 with 80% stenosis and moderate OM2 with subtotal chronic occlusion and bridging collaterals, and chronic total occlusion of the proximal RCA with left-to-right collaterals filling the RPDA.  CV Surgery:  CABG (01/18/2018, Dr. Darcey Nora): LIMA to LAD, SVG to diagonal, and SVG to PL branch of RCA. LAD endarterectomy required for bypass grafting.  Non-Invasive Evaluation(s):  Exercise MPI (12/12/17): Intermediate risk study with moderate in size, moderate in severity basal and mid inferolateral reversible defect consistent with ischemia. LVEF mildly reduced at 51% with inferolateral hypokinesis. Poor exercise tolerance with frequent PVCs noted during stress.  TTE (11/23/17): Normal LV size with mild LVH. LVEF 50-55% with normal wall motion and grade 1 diastolic dysfunction. Mildly thickened and calcified mitral valve consistent with rheumatic  disease. No evidence of stenosis. Trivial regurgitation. Normal RV size and function. Normal PA pressure.  Labs/Other Tests and Data Reviewed:    EKG:  No ECG reviewed.  Recent Labs: 05/14/2018: BUN 25; Creatinine, Ser 1.59; Hemoglobin 11.2; Platelets 299.0; Potassium 4.7; Sodium 141 09/25/2018: ALT 33   Recent Lipid Panel Lab Results  Component Value Date/Time   CHOL 168 09/25/2018 06:52 AM   CHOL 153 07/11/2018 07:59 AM   CHOL 178 02/01/2010   TRIG 239 (H) 09/25/2018 06:52 AM   TRIG 812 02/01/2010   HDL 38 (L) 09/25/2018 06:52 AM   HDL 34 (L) 07/11/2018 07:59 AM   CHOLHDL 4.4 09/25/2018 06:52 AM   LDLCALC 82 09/25/2018 06:52 AM   LDLCALC 79 07/11/2018 07:59 AM   LDLDIRECT 67.0 06/11/2017 07:52 AM    Wt Readings from Last 3 Encounters:  01/29/19 289 lb (131.1 kg)  12/25/18 290 lb (131.5 kg)  09/23/18 289 lb (131.1 kg)     Objective:    Vital Signs:  BP (!) 162/72  (BP Location: Left Arm, Patient Position: Sitting, Cuff Size: Normal)   Pulse 63   Ht _0  (1.778 m)   Wt 289 lb (131.1 kg)   BMI 41.47 kg/m    VITAL SIGNS:  reviewed GEN:  no acute distress  ASSESSMENT & PLAN:    Myalgias and hyperlipidemia: Buttock pain has improved with discontinuation of atorvastatin.  We will rechallenge him with rosuvastatin 5 mg daily and plan to repeat a fasting lipid panel and ALT in about 3 months.  If pain recurs or we are unable to achieve goal LDL less than 70, addition of PCSK9 inhibitor, ezetimibe, or bempedoic acid will need to be considered in the future.  Essential hypertension: Blood pressure is suboptimally controlled today, likely exacerbated by recent dietary indiscretion.  I stressed the importance of sodium restriction.  I will have [name] Young come in for a basic metabolic panel at his convenience to reassess his renal function, as creatinine has been elevated in the past.  If renal function and potassium allow, we will plan to start low-dose lisinopril.  Coronary artery disease: No angina reported.  Continue current medications for secondary prevention with the addition of rosuvastatin as outlined above.  Chronic kidney disease stage III: Creatinine has been modestly elevated over the last year, most recently 1.6 in September.  Given history of diabetes and hypertension, [name] Young would certainly benefit from addition of an ACE inhibitor or ARB.  I will have him come in for a BMP at his convenience with plans to initiate lisinopril, if creatinine and potassium allow.  COVID-19 Education: The signs and symptoms of COVID-19 were discussed with the patient and how to seek care for testing (follow up with PCP or arrange E-visit).  The importance of social distancing was discussed today.  Time:   Today, I have spent 15 minutes with the patient with telehealth technology discussing the above problems.  An additional 10 minutes were spent reviewing  the patient's chart and documenting today's encounter.   Medication Adjustments/Labs and Tests Ordered: Current medicines are reviewed at length with the patient today.  Concerns regarding medicines are outlined above.   Tests Ordered: Orders Placed This Encounter  Procedures  . Basic metabolic panel    Medication Changes: Meds ordered this encounter  Medications  . rosuvastatin (CRESTOR) 5 MG tablet    Sig: Take 1 tablet (5 mg total) by mouth daily.    Dispense:  30 tablet  Refill:  5    Disposition:  Follow up in 3 month(s)  Signed, Nelva Bush, MD  01/29/2019 9:16 AM    [facility]

## 2019-01-29 ENCOUNTER — Telehealth: Payer: Self-pay | Admitting: *Deleted

## 2019-01-29 ENCOUNTER — Encounter: Payer: Self-pay | Admitting: Internal Medicine

## 2019-01-29 ENCOUNTER — Telehealth (INDEPENDENT_AMBULATORY_CARE_PROVIDER_SITE_OTHER): Payer: Medicare Other | Admitting: Internal Medicine

## 2019-01-29 ENCOUNTER — Other Ambulatory Visit: Payer: Self-pay

## 2019-01-29 VITALS — BP 162/72 | HR 63 | Ht 70.0 in | Wt 289.0 lb

## 2019-01-29 DIAGNOSIS — I10 Essential (primary) hypertension: Secondary | ICD-10-CM | POA: Diagnosis not present

## 2019-01-29 DIAGNOSIS — E785 Hyperlipidemia, unspecified: Secondary | ICD-10-CM

## 2019-01-29 DIAGNOSIS — I2510 Atherosclerotic heart disease of native coronary artery without angina pectoris: Secondary | ICD-10-CM

## 2019-01-29 DIAGNOSIS — N183 Chronic kidney disease, stage 3 unspecified: Secondary | ICD-10-CM

## 2019-01-29 DIAGNOSIS — M791 Myalgia, unspecified site: Secondary | ICD-10-CM | POA: Diagnosis not present

## 2019-01-29 MED ORDER — ROSUVASTATIN CALCIUM 5 MG PO TABS: 5.0000 mg | ORAL_TABLET | Freq: Every day | ORAL | 5 refills | Status: DC

## 2019-01-29 NOTE — Patient Instructions (Addendum)
Medication Instructions:  Your physician has recommended you make the following change in your medication:  1- START Rosuvastatin 5 mg ({1 tablet) by mouth once a day.  If you need a refill on your cardiac medications before your next appointment, please call your pharmacy.   Lab work: Your physician recommends that you return for lab work in: BMP in the next week at the [facility]. - Please go to the [facility]. You will check in at the front desk to the right as you walk into the atrium. Valet Parking is offered if needed. - Please wear a mask.  If you have labs (blood work) drawn today and your tests are completely normal, you will receive your results only by: Marland Kitchen MyChart Message (if you have MyChart) OR . A paper copy in the mail If you have any lab test that is abnormal or we need to change your treatment, we will call you to review the results.  Testing/Procedures: none  Follow-Up: At [facility], you and your health needs are our priority.  As part of our continuing mission to provide you with exceptional heart care, we have created designated Provider Care Teams.  These Care Teams include your primary Cardiologist (physician) and Advanced Practice Providers (APPs -  Physician Assistants and Nurse Practitioners) who all work together to provide you with the care you need, when you need it. You will need a follow up appointment in 3 months.  Please call our office 2 months in advance to schedule this appointment.  You may see Nelva Bush, MD or one of the following Advanced Practice Providers on your designated Care Team:   Murray Hodgkins, NP Christell Faith, PA-C . Marrianne Mood, PA-C

## 2019-01-29 NOTE — Telephone Encounter (Signed)
Called and s/w wife, ok per DPR. She verbalized understanding of the instructions on the AVS and knows to have patient go to the [place] sometime this week and to wear a mask.

## 2019-01-30 ENCOUNTER — Telehealth: Payer: Self-pay | Admitting: *Deleted

## 2019-01-30 ENCOUNTER — Other Ambulatory Visit
Admission: RE | Admit: 2019-01-30 | Discharge: 2019-01-30 | Disposition: A | Discharge status: Home / Self Care | Payer: Medicare Other | Source: Ambulatory Visit | Attending: Internal Medicine | Admitting: Internal Medicine

## 2019-01-30 DIAGNOSIS — N183 Chronic kidney disease, stage 3 unspecified: Secondary | ICD-10-CM

## 2019-01-30 DIAGNOSIS — I5032 Chronic diastolic (congestive) heart failure: Secondary | ICD-10-CM

## 2019-01-30 DIAGNOSIS — I10 Essential (primary) hypertension: Secondary | ICD-10-CM | POA: Insufficient documentation

## 2019-01-30 LAB — BASIC METABOLIC PANEL
Anion gap: 12 (ref 5–15)
BUN: 21 mg/dL (ref 8–23)
CO2: 23 mmol/L (ref 22–32)
Calcium: 9.4 mg/dL (ref 8.9–10.3)
Chloride: 105 mmol/L (ref 98–111)
Creatinine, Ser: 1.31 mg/dL — ABNORMAL HIGH (ref 0.61–1.24)
GFR calc Af Amer: >60 mL/min (ref >60)
GFR calc non Af Amer: 53 mL/min — ABNORMAL LOW (ref >60)
Glucose, Bld: 138 mg/dL — ABNORMAL HIGH (ref 70–99)
Potassium: 4.2 mmol/L (ref 3.5–5.1)
Sodium: 140 mmol/L (ref 135–145)

## 2019-01-30 MED ORDER — LISINOPRIL 5 MG PO TABS: 5.0000 mg | ORAL_TABLET | Freq: Every day | ORAL | 3 refills | Status: DC

## 2019-01-30 NOTE — Telephone Encounter (Signed)
Patient's wife answered, ok per DPR.  She verbalized understanding for patient to start lisinopril 5 mg daily and to go to [place] on June 4th for lab work.  Rx sent to pharmacy. Lab orders entered.

## 2019-01-30 NOTE — Telephone Encounter (Signed)
-----   Message from Nelva Bush, MD sent at 01/30/2019  2:57 PM EDT ----- Please let [name] Young know that his renal function and electrolytes are stable to mildly improved from prior checks.  I recommend starting lisinopril 5 mg daily with repeat BMP in ~2 weeks.  He should continue his other medications and monitor his BP at home.

## 2019-02-13 ENCOUNTER — Other Ambulatory Visit
Admission: RE | Admit: 2019-02-13 | Discharge: 2019-02-13 | Disposition: A | Discharge status: Home / Self Care | Payer: Medicare Other | Attending: Internal Medicine | Admitting: Internal Medicine

## 2019-02-13 ENCOUNTER — Other Ambulatory Visit: Payer: Self-pay

## 2019-02-13 DIAGNOSIS — I10 Essential (primary) hypertension: Secondary | ICD-10-CM | POA: Insufficient documentation

## 2019-02-13 DIAGNOSIS — I5032 Chronic diastolic (congestive) heart failure: Secondary | ICD-10-CM | POA: Diagnosis present

## 2019-02-13 LAB — BASIC METABOLIC PANEL
Anion gap: 12 (ref 5–15)
BUN: 24 mg/dL — ABNORMAL HIGH (ref 8–23)
CO2: 24 mmol/L (ref 22–32)
Calcium: 9.5 mg/dL (ref 8.9–10.3)
Chloride: 107 mmol/L (ref 98–111)
Creatinine, Ser: 1.38 mg/dL — ABNORMAL HIGH (ref 0.61–1.24)
GFR calc Af Amer: 57 mL/min — ABNORMAL LOW (ref >60)
GFR calc non Af Amer: 49 mL/min — ABNORMAL LOW (ref >60)
Glucose, Bld: 130 mg/dL — ABNORMAL HIGH (ref 70–99)
Potassium: 4.2 mmol/L (ref 3.5–5.1)
Sodium: 143 mmol/L (ref 135–145)

## 2019-02-14 ENCOUNTER — Telehealth: Payer: Self-pay

## 2019-02-14 NOTE — Telephone Encounter (Signed)
-----   Message from Nelva Bush, MD sent at 02/13/2019  9:29 PM EDT ----- Please let Mr. Mander know that his renal function and electrolytes are stable.  He should continue his current medications and f/u as previously discussed.

## 2019-02-27 ENCOUNTER — Other Ambulatory Visit: Payer: Self-pay | Admitting: Family Medicine

## 2019-05-12 ENCOUNTER — Other Ambulatory Visit: Payer: Self-pay | Admitting: Family Medicine

## 2019-05-12 DIAGNOSIS — E785 Hyperlipidemia, unspecified: Secondary | ICD-10-CM

## 2019-05-12 DIAGNOSIS — E118 Type 2 diabetes mellitus with unspecified complications: Secondary | ICD-10-CM

## 2019-05-12 DIAGNOSIS — I480 Paroxysmal atrial fibrillation: Secondary | ICD-10-CM

## 2019-05-12 DIAGNOSIS — N400 Enlarged prostate without lower urinary tract symptoms: Secondary | ICD-10-CM

## 2019-05-15 ENCOUNTER — Other Ambulatory Visit (INDEPENDENT_AMBULATORY_CARE_PROVIDER_SITE_OTHER): Payer: Medicare Other

## 2019-05-15 ENCOUNTER — Ambulatory Visit: Payer: Medicare Other

## 2019-05-15 ENCOUNTER — Other Ambulatory Visit: Payer: Self-pay

## 2019-05-15 DIAGNOSIS — I480 Paroxysmal atrial fibrillation: Secondary | ICD-10-CM

## 2019-05-15 DIAGNOSIS — E785 Hyperlipidemia, unspecified: Secondary | ICD-10-CM

## 2019-05-15 DIAGNOSIS — N400 Enlarged prostate without lower urinary tract symptoms: Secondary | ICD-10-CM | POA: Diagnosis not present

## 2019-05-15 DIAGNOSIS — E118 Type 2 diabetes mellitus with unspecified complications: Secondary | ICD-10-CM

## 2019-05-15 LAB — CBC WITH DIFFERENTIAL/PLATELET
Basophils Absolute: 0 10*3/uL (ref 0.0–0.1)
Basophils Relative: 0.5 % (ref 0.0–3.0)
Eosinophils Absolute: 0.1 10*3/uL (ref 0.0–0.7)
Eosinophils Relative: 2.2 % (ref 0.0–5.0)
HCT: 35.9 % — ABNORMAL LOW (ref 39.0–52.0)
Hemoglobin: 11.9 g/dL — ABNORMAL LOW (ref 13.0–17.0)
Lymphocytes Relative: 33.3 % (ref 12.0–46.0)
Lymphs Abs: 2.1 10*3/uL (ref 0.7–4.0)
MCHC: 33.1 g/dL (ref 30.0–36.0)
MCV: 96.8 fl (ref 78.0–100.0)
Monocytes Absolute: 0.4 10*3/uL (ref 0.1–1.0)
Monocytes Relative: 6.5 % (ref 3.0–12.0)
Neutro Abs: 3.7 10*3/uL (ref 1.4–7.7)
Neutrophils Relative %: 57.5 % (ref 43.0–77.0)
Platelets: 194 10*3/uL (ref 150.0–400.0)
RBC: 3.71 Mil/uL — ABNORMAL LOW (ref 4.22–5.81)
RDW: 15.9 % — ABNORMAL HIGH (ref 11.5–15.5)
WBC: 6.4 10*3/uL (ref 4.0–10.5)

## 2019-05-15 LAB — LDL CHOLESTEROL, DIRECT: Direct LDL: 32 mg/dL

## 2019-05-15 LAB — LIPID PANEL
Cholesterol: 194 mg/dL (ref 0–200)
HDL: 23.7 mg/dL — ABNORMAL LOW (ref >39.00)
Total CHOL/HDL Ratio: 8
Triglycerides: 984 mg/dL — ABNORMAL HIGH (ref 0.0–149.0)

## 2019-05-15 LAB — COMPREHENSIVE METABOLIC PANEL
ALT: 28 U/L (ref 0–53)
AST: 32 U/L (ref 0–37)
Albumin: 3.8 g/dL (ref 3.5–5.2)
Alkaline Phosphatase: 58 U/L (ref 39–117)
BUN: 16 mg/dL (ref 6–23)
CO2: 29 mEq/L (ref 19–32)
Calcium: 9.1 mg/dL (ref 8.4–10.5)
Chloride: 103 mEq/L (ref 96–112)
Creatinine, Ser: 1.17 mg/dL (ref 0.40–1.50)
GFR: 60.47 mL/min (ref >60.00)
Glucose, Bld: 141 mg/dL — ABNORMAL HIGH (ref 70–99)
Potassium: 4.8 mEq/L (ref 3.5–5.1)
Sodium: 141 mEq/L (ref 135–145)
Total Bilirubin: 0.5 mg/dL (ref 0.2–1.2)
Total Protein: 6.3 g/dL (ref 6.0–8.3)

## 2019-05-15 LAB — PSA: PSA: 0.69 ng/mL (ref 0.10–4.00)

## 2019-05-15 LAB — HEMOGLOBIN A1C: Hgb A1c MFr Bld: 7 % — ABNORMAL HIGH (ref 4.6–6.5)

## 2019-05-22 ENCOUNTER — Other Ambulatory Visit: Payer: Self-pay

## 2019-05-22 ENCOUNTER — Ambulatory Visit (INDEPENDENT_AMBULATORY_CARE_PROVIDER_SITE_OTHER): Payer: Medicare Other | Admitting: Family Medicine

## 2019-05-22 ENCOUNTER — Encounter: Payer: Self-pay | Admitting: Family Medicine

## 2019-05-22 VITALS — BP 150/80 | HR 69 | Temp 97.6°F | Ht 70.5 in | Wt 306.4 lb

## 2019-05-22 DIAGNOSIS — Z6841 Body Mass Index (BMI) 40.0 and over, adult: Secondary | ICD-10-CM

## 2019-05-22 DIAGNOSIS — H9191 Unspecified hearing loss, right ear: Secondary | ICD-10-CM | POA: Insufficient documentation

## 2019-05-22 DIAGNOSIS — E785 Hyperlipidemia, unspecified: Secondary | ICD-10-CM

## 2019-05-22 DIAGNOSIS — Z0000 Encounter for general adult medical examination without abnormal findings: Secondary | ICD-10-CM

## 2019-05-22 DIAGNOSIS — E118 Type 2 diabetes mellitus with unspecified complications: Secondary | ICD-10-CM

## 2019-05-22 DIAGNOSIS — I5032 Chronic diastolic (congestive) heart failure: Secondary | ICD-10-CM

## 2019-05-22 DIAGNOSIS — H9193 Unspecified hearing loss, bilateral: Secondary | ICD-10-CM | POA: Insufficient documentation

## 2019-05-22 DIAGNOSIS — N400 Enlarged prostate without lower urinary tract symptoms: Secondary | ICD-10-CM

## 2019-05-22 DIAGNOSIS — K219 Gastro-esophageal reflux disease without esophagitis: Secondary | ICD-10-CM

## 2019-05-22 DIAGNOSIS — Z951 Presence of aortocoronary bypass graft: Secondary | ICD-10-CM

## 2019-05-22 DIAGNOSIS — E1122 Type 2 diabetes mellitus with diabetic chronic kidney disease: Secondary | ICD-10-CM

## 2019-05-22 DIAGNOSIS — Z1211 Encounter for screening for malignant neoplasm of colon: Secondary | ICD-10-CM

## 2019-05-22 DIAGNOSIS — Z7189 Other specified counseling: Secondary | ICD-10-CM

## 2019-05-22 DIAGNOSIS — I10 Essential (primary) hypertension: Secondary | ICD-10-CM

## 2019-05-22 DIAGNOSIS — I480 Paroxysmal atrial fibrillation: Secondary | ICD-10-CM

## 2019-05-22 NOTE — Assessment & Plan Note (Signed)
Chronic, reviewed recent trend. Encouraged renewed efforts at following diabetic diet. Continue metformin, januvia.

## 2019-05-22 NOTE — Assessment & Plan Note (Addendum)
Chronic, elevated today. They will start start monitoring more closely at home and let me know if consistently elevated to likely increase ACEI dosing.

## 2019-05-22 NOTE — Assessment & Plan Note (Signed)
Will refer to audiology for further evaluation

## 2019-05-22 NOTE — Assessment & Plan Note (Signed)
PSA stable. DRE with evidence of BPH. See below.

## 2019-05-22 NOTE — Assessment & Plan Note (Signed)
Preventative protocols reviewed and updated unless pt declined. Discussed healthy diet and lifestyle.  

## 2019-05-22 NOTE — Assessment & Plan Note (Signed)
Continue PPI MWF

## 2019-05-22 NOTE — Assessment & Plan Note (Signed)
Renal function stable.

## 2019-05-22 NOTE — Patient Instructions (Addendum)
We will refer you to audiologist.  We will refer you to GI to discuss colonoscopy  Let me know if you change your mind about physical therapy for fall prevention.  Return fasting at your convenience to recheck triglyceride levels. If remaining high, we may need to restart tricor.  Start monitoring blood pressures at home - if staying consistently >140/90 let me know and we will increase blood pressure medicine.  Return as needed or in 6 months for follow up visit.   Health Maintenance After Age 62 After age 39, you are at a higher risk for certain long-term diseases and infections as well as injuries from falls. Falls are a major cause of broken bones and head injuries in people who are older than age 13. Getting regular preventive care can help to keep you healthy and well. Preventive care includes getting regular testing and making lifestyle changes as recommended by your health care provider. Talk with your health care provider about:  Which screenings and tests you should have. A screening is a test that checks for a disease when you have no symptoms.  A diet and exercise plan that is right for you. What should I know about screenings and tests to prevent falls? Screening and testing are the best ways to find a health problem early. Early diagnosis and treatment give you the best chance of managing medical conditions that are common after age 53. Certain conditions and lifestyle choices may make you more likely to have a fall. Your health care provider may recommend:  Regular vision checks. Poor vision and conditions such as cataracts can make you more likely to have a fall. If you wear glasses, make sure to get your prescription updated if your vision changes.  Medicine review. Work with your health care provider to regularly review all of the medicines you are taking, including over-the-counter medicines. Ask your health care provider about any side effects that may make you more likely to  have a fall. Tell your health care provider if any medicines that you take make you feel dizzy or sleepy.  Osteoporosis screening. Osteoporosis is a condition that causes the bones to get weaker. This can make the bones weak and cause them to break more easily.  Blood pressure screening. Blood pressure changes and medicines to control blood pressure can make you feel dizzy.  Strength and balance checks. Your health care provider may recommend certain tests to check your strength and balance while standing, walking, or changing positions.  Foot health exam. Foot pain and numbness, as well as not wearing proper footwear, can make you more likely to have a fall.  Depression screening. You may be more likely to have a fall if you have a fear of falling, feel emotionally low, or feel unable to do activities that you used to do.  Alcohol use screening. Using too much alcohol can affect your balance and may make you more likely to have a fall. What actions can I take to lower my risk of falls? General instructions  Talk with your health care provider about your risks for falling. Tell your health care provider if: ? You fall. Be sure to tell your health care provider about all falls, even ones that seem minor. ? You feel dizzy, sleepy, or off-balance.  Take over-the-counter and prescription medicines only as told by your health care provider. These include any supplements.  Eat a healthy diet and maintain a healthy weight. A healthy diet includes low-fat dairy products, low-fat (  lean) meats, and fiber from whole grains, beans, and lots of fruits and vegetables. Home safety  Remove any tripping hazards, such as rugs, cords, and clutter.  Install safety equipment such as grab bars in bathrooms and safety rails on stairs.  Keep rooms and walkways well-lit. Activity   Follow a regular exercise program to stay fit. This will help you maintain your balance. Ask your health care provider what  types of exercise are appropriate for you.  If you need a cane or walker, use it as recommended by your health care provider.  Wear supportive shoes that have nonskid soles. Lifestyle  Do not drink alcohol if your health care provider tells you not to drink.  If you drink alcohol, limit how much you have: ? 0-1 drink a day for women. ? 0-2 drinks a day for men.  Be aware of how much alcohol is in your drink. In the [place] one drink equals one typical bottle of beer (12 oz), one-half glass of wine (5 oz), or one shot of hard liquor (1 oz).  Do not use any products that contain nicotine or tobacco, such as cigarettes and e-cigarettes. If you need help quitting, ask your health care provider. Summary  Having a healthy lifestyle and getting preventive care can help to protect your health and wellness after age 42.  Screening and testing are the best way to find a health problem early and help you avoid having a fall. Early diagnosis and treatment give you the best chance for managing medical conditions that are more common for people who are older than age 70.  Falls are a major cause of broken bones and head injuries in people who are older than age 58. Take precautions to prevent a fall at home.  Work with your health care provider to learn what changes you can make to improve your health and wellness and to prevent falls. This information is not intended to replace advice given to you by your health care provider. Make sure you discuss any questions you have with your health care provider. Document Released: 07/11/2017 Document Revised: 12/19/2018 Document Reviewed: 07/11/2017 Elsevier Patient Education  2020 [employer].

## 2019-05-22 NOTE — Assessment & Plan Note (Signed)

## 2019-05-22 NOTE — Assessment & Plan Note (Signed)
Weight gain noted without signs of CHF exacerbation. Discussed importance of healthy diet and lifestyle to affect sustainable weight loss.

## 2019-05-22 NOTE — Assessment & Plan Note (Addendum)
LDL at goal. Triglycerides markedly elevated. Will return fasting to recheck this. Previously on tricor - will restart if persistently elevated.  The 10-year ASCVD risk score Mikey Bussing DC Brooke Bonito., et al., 2013) is: 68.7%   Values used to calculate the score:     Age: 78 years     Sex: Male     Is Non-Hispanic African American: No     Diabetic: Yes     Tobacco smoker: No     Systolic Blood Pressure: 865 mmHg     Is BP treated: Yes     HDL Cholesterol: 23.7 mg/dL     Total Cholesterol: 194 mg/dL

## 2019-05-22 NOTE — Assessment & Plan Note (Signed)
Sounds regular today. Continue aspirin 81mg  daily.

## 2019-05-22 NOTE — Assessment & Plan Note (Signed)
Seems euvolemic.  

## 2019-05-22 NOTE — Progress Notes (Signed)
This visit was conducted in person.  BP (!) 150/80 (BP Location: Right Arm, Patient Position: Sitting, Cuff Size: Large)   Pulse 69   Temp 97.6 F (36.4 C) (Tympanic)   Ht 5' 10.5" (1.791 m)   Wt (!) 306 lb 6 oz (139 kg)   SpO2 97%   BMI 43.34 kg/m    CC: AMW/CPE Subjective:    Patient ID: [name] Luna., male    DOB: 11/02/1940, 78 y.o.   MRN: [id number]  HPI: [name] Russett. is a 78 y.o. male presenting on 05/22/2019 for Medicare Wellness (Pt accompanied by wife, [name] Young. )   Did not see health advisor this year for medicare wellness visit.    Hearing Screening   125Hz  250Hz  500Hz  1000Hz  2000Hz  3000Hz  4000Hz  6000Hz  8000Hz   Right ear:   40 0 0  0    Left ear:   25 25 25   0      Visual Acuity Screening   Right eye Left eye Both eyes  Without correction: 20/25 20/25 20/20   With correction:     pt and wife note trouble with hearing - agrees to audiology eval   Office Visit from 05/22/2019 in [facility] at [facility]  PHQ-2 Total Score  1      Fall Risk  05/22/2019 03/08/2018 12/10/2017 11/06/2016 11/03/2015  Falls in the past year? 1 No No No No  Number falls in past yr: 1 - - - -  Injury with Fall? 0 - - - -  Risk for fall due to : - - - - -  Risk for fall due to: Comment - - - - -  Several falls this past year - tripped over tree roots, no injury. Notes worsening balance - has started using walking cane regularly with benefit. Declines PT referral.   S/p 3v CABG and coronary endarterectomy 01/2018 for severe symptomatic CAD. Last saw cardiology 01/2019 for telemedicine visit. Atorvastatin changed to rosuvastatin with benefit in myalgias. tricor was stopped.   BP elevated today - he had egg sausage and toast this morning. Has not been checking BP at home.   17 lb weight gain since May.   Preventative: COLONOSCOPY 12/2013 3 polyps, diverticulosis, rec rpt 3 yrs Ardis Hughs). Previously postponed due to heart issues then covid pandemic. Will re refer.   Prostate - yearly check- PSA normal.Will deferDRE, considernext year. Flu shot - yearly  Pneumovax 09/2011, prevnar 04/2014 Td 09/2011 zostavax- declines shingrix - declines Advanced directive: would wantdaughter RNto beHCPOA.Saw lawyer and completed.Asked to bring [place] copy. Does not want prolonged life support. Pt wants to be DNR/DNI. Seat belt use discussed Sunscreen use discussed. No changing moles on skin.  Non smoker Alcohol - none Dentist yearly Eye exam yearly Bowel - no constipation Bladder - some urge incontinence with leaking, also noted when standing up after sitting for a while - super beta prostate worsened pedal edema. 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 Interim medical history since our last visit reviewed. Allergies and medications reviewed and updated. Outpatient Medications Prior to Visit  Medication Sig Dispense Refill  . ACCU-CHEK AVIVA PLUS test strip USE AS DIRECTED TO CHECK BLOOD  SUGARS UPTO FOUR TIMES DAILY. 100 each 2  . aspirin EC 81 MG tablet Take 81 mg by mouth daily.    . blood glucose meter kit and supplies Dispense based on patient and insurance preference. Use up to four times daily as directed. (FOR ICD-10 E10.9, E11.9). 1 each 0  . Cholecalciferol (VITAMIN D) 2000 UNITS CAPS Take 2,000 Units by mouth daily.     Marland Kitchen JANUVIA 50 MG tablet TAKE 1 TABLET BY MOUTH ONCE DAILY 90 tablet 1  . metFORMIN (GLUCOPHAGE) 500 MG tablet TAKE 1 TABLET BY MOUTH TWICE (2) DAILY WITH MEALS 180 tablet 1  . metoprolol succinate (TOPROL-XL) 25 MG 24 hr tablet Take 1 tablet (25 mg total) by mouth daily. Take with or immediately  following a meal. 90 tablet 3  . Multiple Vitamins-Minerals (MACULAR VITAMIN BENEFIT PO) Take 1 tablet by mouth daily.    . nitroGLYCERIN (NITROSTAT) 0.4 MG SL tablet Place 1 tablet (0.4 mg total) under the tongue every 5 (five) minutes as needed for chest pain. 30 tablet prn  . omeprazole (PRILOSEC) 40 MG capsule Take 1 (one) capsule by mouth every Monday, Wednesday and Friday 30 capsule 3  . lisinopril (ZESTRIL) 5 MG tablet Take 1 tablet (5 mg total) by mouth daily. 90 tablet 3  . rosuvastatin (CRESTOR) 5 MG tablet Take 1 tablet (5 mg total) by mouth daily. 30 tablet 5   No facility-administered medications prior to visit.      Per HPI unless specifically indicated in ROS section below Review of Systems  Constitutional: Negative for activity change, appetite change, chills, fatigue, fever and unexpected weight change.  HENT: Negative for hearing loss.   Eyes: Negative for visual disturbance.  Respiratory: Negative for cough, chest tightness, shortness of breath and wheezing.   Cardiovascular: Negative for chest pain, palpitations and leg swelling.  Gastrointestinal: Negative for abdominal distention, abdominal pain, blood in stool, constipation, diarrhea, nausea and vomiting.  Genitourinary: Negative for difficulty urinating and hematuria.  Musculoskeletal: Negative for arthralgias, myalgias and neck pain.  Skin: Negative for rash.  Neurological: Negative for dizziness, seizures, syncope and headaches.  Hematological: Negative for adenopathy. Does not bruise/bleed easily.  Psychiatric/Behavioral: Negative for dysphoric mood. The patient is not nervous/anxious.    Objective:    BP (!) 150/80 (BP Location: Right Arm, Patient Position: Sitting, Cuff Size: Large)   Pulse 69   Temp 97.6 F (36.4 C) (Tympanic)   Ht 5' 10.5" (1.791 m)   Wt (!) 306 lb 6 oz (139 kg)   SpO2 97%   BMI 43.34 kg/m   Wt Readings from Last 3 Encounters:  05/22/19 (!) 306 lb 6 oz (139 kg)  01/29/19 289 lb  (131.1 kg)  12/25/18 290 lb (131.5 kg)    Physical Exam Vitals signs and nursing note reviewed.  Constitutional:      General: He is not in acute distress.    Appearance: Normal appearance. He is well-developed. He is obese. He is not ill-appearing.  HENT:     Head: Normocephalic and atraumatic.     Right Ear: Hearing, tympanic membrane, ear canal and external ear normal.     Left Ear: Hearing, tympanic membrane, ear canal and external ear normal.     Nose: Nose normal.     Mouth/Throat:     Mouth: Mucous membranes are moist.     Pharynx: Uvula midline. No oropharyngeal exudate or posterior oropharyngeal erythema.  Eyes:     General: No scleral icterus.  Extraocular Movements: Extraocular movements intact.     Conjunctiva/sclera: Conjunctivae normal.     Pupils: Pupils are equal, round, and reactive to light.  Neck:     Musculoskeletal: Normal range of motion and neck supple.     Vascular: No carotid bruit.  Cardiovascular:     Rate and Rhythm: Normal rate and regular rhythm.     Pulses: Normal pulses.          Radial pulses are 2+ on the right side and 2+ on the left side.     Heart sounds: Normal heart sounds. No murmur.  Pulmonary:     Effort: Pulmonary effort is normal. No respiratory distress.     Breath sounds: Normal breath sounds. No wheezing, rhonchi or rales.  Abdominal:     General: Abdomen is flat. Bowel sounds are normal. There is no distension.     Palpations: Abdomen is soft. There is no mass.     Tenderness: There is no abdominal tenderness. There is no guarding or rebound.     Hernia: No hernia is present.  Genitourinary:    Prostate: Enlarged (20gm). Not tender and no nodules present.     Rectum: Normal. No mass, tenderness, anal fissure, external hemorrhoid or internal hemorrhoid. Normal anal tone.  Musculoskeletal: Normal range of motion.  Lymphadenopathy:     Cervical: No cervical adenopathy.  Skin:    General: Skin is warm and dry.     Findings: No  rash.  Neurological:     General: No focal deficit present.     Mental Status: He is alert and oriented to person, place, and time.     Comments:  CN grossly intact, station and gait intact Recall 2/3, 3/3 with cue Calculation 4/5 D-L-O-W  Psychiatric:        Mood and Affect: Mood normal.        Behavior: Behavior normal.        Thought Content: Thought content normal.        Judgment: Judgment normal.       Results for orders placed or performed in visit on 05/15/19  CBC with Differential/Platelet  Result Value Ref Range   WBC 6.4 4.0 - 10.5 K/uL   RBC 3.71 (L) 4.22 - 5.81 Mil/uL   Hemoglobin 11.9 (L) 13.0 - 17.0 g/dL   HCT 35.9 (L) 39.0 - 52.0 %   MCV 96.8 78.0 - 100.0 fl   MCHC 33.1 30.0 - 36.0 g/dL   RDW 15.9 (H) 11.5 - 15.5 %   Platelets 194.0 150.0 - 400.0 K/uL   Neutrophils Relative % 57.5 43.0 - 77.0 %   Lymphocytes Relative 33.3 12.0 - 46.0 %   Monocytes Relative 6.5 3.0 - 12.0 %   Eosinophils Relative 2.2 0.0 - 5.0 %   Basophils Relative 0.5 0.0 - 3.0 %   Neutro Abs 3.7 1.4 - 7.7 K/uL   Lymphs Abs 2.1 0.7 - 4.0 K/uL   Monocytes Absolute 0.4 0.1 - 1.0 K/uL   Eosinophils Absolute 0.1 0.0 - 0.7 K/uL   Basophils Absolute 0.0 0.0 - 0.1 K/uL  Hemoglobin A1c  Result Value Ref Range   Hgb A1c MFr Bld 7.0 (H) 4.6 - 6.5 %  PSA  Result Value Ref Range   PSA 0.69 0.10 - 4.00 ng/mL  Lipid panel  Result Value Ref Range   Cholesterol 194 0 - 200 mg/dL   Triglycerides (H) 0.0 - 149.0 mg/dL    984.0 Triglyceride is over 400; calculations on  Lipids are invalid.   HDL 23.70 (L) >39.00 mg/dL   Total CHOL/HDL Ratio 8   Comprehensive metabolic panel  Result Value Ref Range   Sodium 141 135 - 145 mEq/L   Potassium 4.8 3.5 - 5.1 mEq/L   Chloride 103 96 - 112 mEq/L   CO2 29 19 - 32 mEq/L   Glucose, Bld 141 (H) 70 - 99 mg/dL   BUN 16 6 - 23 mg/dL   Creatinine, Ser 1.17 0.40 - 1.50 mg/dL   Total Bilirubin 0.5 0.2 - 1.2 mg/dL   Alkaline Phosphatase 58 39 - 117 U/L   AST 32  0 - 37 U/L   ALT 28 0 - 53 U/L   Total Protein 6.3 6.0 - 8.3 g/dL   Albumin 3.8 3.5 - 5.2 g/dL   Calcium 9.1 8.4 - 10.5 mg/dL   GFR 60.47 >60.00 mL/min  LDL cholesterol, direct  Result Value Ref Range   Direct LDL 32.0 mg/dL   Assessment & Plan:   Problem List Items Addressed This Visit    S/P CABG x 3   Paroxysmal atrial fibrillation ([place])    Sounds regular today. Continue aspirin 73m daily.       Morbid obesity with BMI of 40.0-44.9, adult ([place]    Weight gain noted without signs of CHF exacerbation. Discussed importance of healthy diet and lifestyle to affect sustainable weight loss.       Medicare annual wellness visit, subsequent - Primary    I have personally reviewed the Medicare Annual Wellness questionnaire and have noted 1. The patient's medical and social history 2. Their use of alcohol, tobacco or illicit drugs 3. Their current medications and supplements 4. The patient's functional ability including ADL's, fall risks, home safety risks and hearing or visual impairment. Cognitive function has been assessed and addressed as indicated.  5. Diet and physical activity 6. Evidence for depression or mood disorders The patients weight, height, BMI have been recorded in the chart. I have made referrals, counseling and provided education to the patient based on review of the above and I have provided the pt with a written personalized care plan for preventive services. Provider list updated.. See scanned questionairre as needed for further documentation. Reviewed preventative protocols and updated unless pt declined.       Hyperlipidemia LDL goal <70    LDL at goal. Triglycerides markedly elevated. Will return fasting to recheck this. Previously on tricor - will restart if persistently elevated.  The 10-year ASCVD risk score (Mikey BussingDC JBrooke Bonito, et al., 2013) is: 68.7%   Values used to calculate the score:     Age: 89years     Sex: Male     Is Non-Hispanic African American:  No     Diabetic: Yes     Tobacco smoker: No     Systolic Blood Pressure: 1283mmHg     Is BP treated: Yes     HDL Cholesterol: 23.7 mg/dL     Total Cholesterol: 194 mg/dL       Relevant Orders   Triglycerides   Health maintenance examination    Preventative protocols reviewed and updated unless pt declined. Discussed healthy diet and lifestyle.       GERD (gastroesophageal reflux disease)    Continue PPI MWF      Essential hypertension    Chronic, elevated today. They will start start monitoring more closely at home and let me know if consistently elevated to likely increase ACEI dosing.  Controlled diabetes mellitus type 2 with complications (HCC)    Chronic, reviewed recent trend. Encouraged renewed efforts at following diabetic diet. Continue metformin, januvia.       Relevant Orders   Microalbumin / creatinine urine ratio   CKD stage 3 due to type 2 diabetes mellitus ([place])    Renal function stable.       Chronic heart failure with preserved ejection fraction (HCC)    Seems euvolemic.       Bilateral hearing loss    Will refer to audiology for further evaluation      Relevant Orders   Ambulatory referral to Audiology   Benign prostatic hyperplasia    PSA stable. DRE with evidence of BPH. See below.       Advanced care planning/counseling discussion    Advanced directive: would wantdaughter RNto beHCPOA.Saw lawyer and completed.Asked to bring [place] copy. Does not want prolonged life support. Pt wants to be DNR/DNI.       Other Visit Diagnoses    Special screening for malignant neoplasms, colon       Relevant Orders   Ambulatory referral to Gastroenterology       No orders of the defined types were placed in this encounter.  Orders Placed This Encounter  Procedures  . Triglycerides    Standing Status:   Future    Standing Expiration Date:   05/21/2020  . Microalbumin / creatinine urine ratio    Standing Status:   Future    Standing  Expiration Date:   05/21/2020  . Ambulatory referral to Gastroenterology    Referral Priority:   Routine    Referral Type:   Consultation    Referral Reason:   Specialty Services Required    Number of Visits Requested:   1  . Ambulatory referral to Audiology    Referral Priority:   Routine    Referral Type:   Audiology Exam    Referral Reason:   Specialty Services Required    Number of Visits Requested:   1    Patient instructions: We will refer you to audiologist.  We will refer you to GI to discuss colonoscopy  Let me know if you change your mind about physical therapy for fall prevention.  Return fasting at your convenience to recheck triglyceride levels. If remaining high, we may need to restart tricor.  Start monitoring blood pressures at home - if staying consistently >140/90 let me know and we will increase blood pressure medicine.  Return as needed or in 6 months for follow up visit.   Follow up plan: Return in about 6 months (around 11/19/2019) for follow up visit.  Ria Bush, MD

## 2019-05-22 NOTE — Assessment & Plan Note (Signed)
Advanced directive: would wantdaughter RNto beHCPOA.Saw lawyer and completed.Asked to bring [place] copy. Does not want prolonged life support. Pt wants to be DNR/DNI.

## 2019-05-26 ENCOUNTER — Other Ambulatory Visit (INDEPENDENT_AMBULATORY_CARE_PROVIDER_SITE_OTHER): Payer: Medicare Other

## 2019-05-26 ENCOUNTER — Encounter: Payer: Self-pay | Admitting: Gastroenterology

## 2019-05-26 ENCOUNTER — Other Ambulatory Visit: Payer: Self-pay

## 2019-05-26 DIAGNOSIS — E785 Hyperlipidemia, unspecified: Secondary | ICD-10-CM | POA: Diagnosis not present

## 2019-05-26 DIAGNOSIS — E118 Type 2 diabetes mellitus with unspecified complications: Secondary | ICD-10-CM

## 2019-05-26 LAB — TRIGLYCERIDES: Triglycerides: 906 mg/dL — ABNORMAL HIGH (ref 0.0–149.0)

## 2019-05-26 LAB — MICROALBUMIN / CREATININE URINE RATIO
Creatinine,U: 179.6 mg/dL
Microalb Creat Ratio: 1 mg/g (ref 0.0–30.0)
Microalb, Ur: 1.7 mg/dL (ref 0.0–1.9)

## 2019-05-28 ENCOUNTER — Other Ambulatory Visit: Payer: Self-pay | Admitting: Family Medicine

## 2019-05-28 MED ORDER — FENOFIBRATE 145 MG PO TABS: 145.0000 mg | ORAL_TABLET | Freq: Every day | ORAL | 3 refills | Status: DC

## 2019-06-05 ENCOUNTER — Other Ambulatory Visit: Payer: Self-pay | Admitting: Internal Medicine

## 2019-06-12 HISTORY — PX: COLONOSCOPY: SHX174

## 2019-06-13 ENCOUNTER — Other Ambulatory Visit: Payer: Self-pay

## 2019-06-13 ENCOUNTER — Ambulatory Visit (AMBULATORY_SURGERY_CENTER): Payer: Self-pay | Admitting: *Deleted

## 2019-06-13 ENCOUNTER — Encounter: Payer: Self-pay | Admitting: Gastroenterology

## 2019-06-13 VITALS — Temp 96.8°F | Ht 70.5 in | Wt 302.2 lb

## 2019-06-13 DIAGNOSIS — Z86010 Personal history of colonic polyps: Secondary | ICD-10-CM

## 2019-06-13 MED ORDER — PEG 3350-KCL-NA BICARB-NACL 420 G PO SOLR: 4000.0000 mL | Freq: Once | ORAL | 0 refills | Status: AC

## 2019-06-13 NOTE — Progress Notes (Addendum)
No egg or soy allergy known to patient  No issues with past sedation with any surgeries  or procedures, no intubation problems  No diet pills per patient No home 02 use per patient  No blood thinners per patient  Pt denies issues with constipation  No A fib or A flutter  EMMI video sent to pt's e mail   Due to the COVID-19 pandemic we are asking patients to follow these guidelines. Please only bring one care partner. Please be aware that your care partner may wait in the car in the parking lot or if they feel like they will be too hot to wait in the car, they may wait in the lobby on the 4th floor. All care partners are required to wear a mask the entire time (we do not have any that we can provide them), they need to practice social distancing, and we will do a Covid check for all patient's and care partners when you arrive. Also we will check their temperature and your temperature. If the care partner waits in their car they need to stay in the parking lot the entire time and we will call them on their cell phone when the patient is ready for discharge so they can bring the car to the front of the building. Also all patient's will need to wear a mask into building.  Wife accompanied patient to assist with interpretation.  Her temp was 97.6 and wearing a mask

## 2019-06-26 ENCOUNTER — Telehealth: Payer: Self-pay

## 2019-06-26 NOTE — Telephone Encounter (Signed)
Covid-19 screening questions   Do you now or have you had a fever in the last 14 days?  Do you have any respiratory symptoms of shortness of breath or cough now or in the last 14 days?  Do you have any family members or close contacts with diagnosed or suspected Covid-19 in the past 14 days?  Have you been tested for Covid-19 and found to be positive?       

## 2019-06-26 NOTE — Telephone Encounter (Signed)
Pt answered "NO" to all Covid Questions °

## 2019-06-27 ENCOUNTER — Other Ambulatory Visit: Payer: Self-pay

## 2019-06-27 ENCOUNTER — Encounter: Payer: Self-pay | Admitting: Gastroenterology

## 2019-06-27 ENCOUNTER — Ambulatory Visit (AMBULATORY_SURGERY_CENTER): Payer: Medicare Other | Admitting: Gastroenterology

## 2019-06-27 VITALS — BP 136/59 | HR 57 | Temp 97.8°F | Resp 17 | Ht 70.5 in | Wt 302.2 lb

## 2019-06-27 DIAGNOSIS — D122 Benign neoplasm of ascending colon: Secondary | ICD-10-CM

## 2019-06-27 DIAGNOSIS — Z86010 Personal history of colonic polyps: Secondary | ICD-10-CM

## 2019-06-27 DIAGNOSIS — D123 Benign neoplasm of transverse colon: Secondary | ICD-10-CM | POA: Diagnosis not present

## 2019-06-27 MED ORDER — SODIUM CHLORIDE 0.9 % IV SOLN: 500.0000 mL | Freq: Once | INTRAVENOUS | Status: DC

## 2019-06-27 NOTE — Op Note (Signed)
[facility] Patient Name: [name] Young Procedure Date: 06/27/2019 10:09 AM MRN: [id number] Endoscopist: Milus Banister , MD Age: 78 Referring MD:  Date of Birth: 02/19/1941 Gender: Male Account #: [id number] Procedure:                Colonoscopy Indications:              High risk colon cancer surveillance: Personal                            history of colonic polyps; colonosc opy 2014 ten                            adenomatous polyps removed, colonoscopy 2015 four                            adenomatous polyps removed Medicines:                Monitored Anesthesia Care Procedure:                Pre-Anesthesia Assessment:                           - Prior to the procedure, a History and Physical                            was performed, and patient medications and                            allergies were reviewed. The patient's tolerance of                            previous anesthesia was also reviewed. The risks                            and benefits of the procedure and the sedation                            options and risks were discussed with the patient.                            All questions were answered, and informed consent                            was obtained. Prior Anticoagulants: The patient has                            taken no previous anticoagulant or antiplatelet                            agents. ASA Grade Assessment: III - A patient with                            severe systemic disease. After reviewing the risks  and benefits, the patient was deemed in                            satisfactory condition to undergo the procedure.                           After obtaining informed consent, the colonoscope                            was passed under direct vision. Throughout the                            procedure, the patient's blood pressure, pulse, and                            oxygen saturations were monitored  continuously. The                            Colonoscope was introduced through the anus and                            advanced to the the cecum, identified by                            appendiceal orifice and ileocecal valve. The                            colonoscopy was performed without difficulty. The                            patient tolerated the procedure well. The quality                            of the bowel preparation was adequate. The                            ileocecal valve, appendiceal orifice, and rectum                            were photographed. Scope In: 10:14:39 AM Scope Out: 10:27:23 AM Scope Withdrawal Time: 0 hours 10 minutes 12 seconds  Total Procedure Duration: 0 hours 12 minutes 44 seconds  Findings:                 Six sessile polyps were found in the transverse                            colon, hepatic flexure and ascending colon. The                            polyps were 2 to 6 mm in size. These polyps were                            removed with a cold snare. Resection and retrieval  were complete.                           Multiple small and large-mouthed diverticula were                            found in the left colon.                           The exam was otherwise without abnormality on                            direct and retroflexion views. Complications:            No immediate complications. Estimated blood loss:                            None. Estimated Blood Loss:     Estimated blood loss: none. Impression:               - Six 2 to 6 mm polyps in the transverse colon, at                            the hepatic flexure and in the ascending colon,                            removed with a cold snare. Resected and retrieved.                           - Diverticulosis in the left colon.                           - The examination was otherwise normal on direct                            and retroflexion  views. Recommendation:           - Patient has a contact number available for                            emergencies. The signs and symptoms of potential                            delayed complications were discussed with the                            patient. Return to normal activities tomorrow.                            Written discharge instructions were provided to the                            patient.                           - Resume previous diet.                           -  Continue present medications.                           - Await pathology results. Milus Banister, MD 06/27/2019 10:30:43 AM This report has been signed electronically.

## 2019-06-27 NOTE — Patient Instructions (Signed)
Handout on polyps and diverticulosis given   YOU HAD AN ENDOSCOPIC PROCEDURE TODAY AT [facility]:   Refer to the procedure report that was given to you for any specific questions about what was found during the examination.  If the procedure report does not answer your questions, please call your gastroenterologist to clarify.  If you requested that your care partner not be given the details of your procedure findings, then the procedure report has been included in a sealed envelope for you to review at your convenience later.  YOU SHOULD EXPECT: Some feelings of bloating in the abdomen. Passage of more gas than usual.  Walking can help get rid of the air that was put into your GI tract during the procedure and reduce the bloating. If you had a lower endoscopy (such as a colonoscopy or flexible sigmoidoscopy) you may notice spotting of blood in your stool or on the toilet paper. If you underwent a bowel prep for your procedure, you may not have a normal bowel movement for a few days.  Please Note:  You might notice some irritation and congestion in your nose or some drainage.  This is from the oxygen used during your procedure.  There is no need for concern and it should clear up in a day or so.  SYMPTOMS TO REPORT IMMEDIATELY:   Following lower endoscopy (colonoscopy or flexible sigmoidoscopy):  Excessive amounts of blood in the stool  Significant tenderness or worsening of abdominal pains  Swelling of the abdomen that is new, acute  Fever of 100F or higher    For urgent or emergent issues, a gastroenterologist can be reached at any hour by calling [phone].   DIET:  We do recommend a small meal at first, but then you may proceed to your regular diet.  Drink plenty of fluids but you should avoid alcoholic beverages for 24 hours.  ACTIVITY:  You should plan to take it easy for the rest of today and you should NOT DRIVE or use heavy machinery until tomorrow (because  of the sedation medicines used during the test).    FOLLOW UP: Our staff will call the number listed on your records 48-72 hours following your procedure to check on you and address any questions or concerns that you may have regarding the information given to you following your procedure. If we do not reach you, we will leave a message.  We will attempt to reach you two times.  During this call, we will ask if you have developed any symptoms of COVID 19. If you develop any symptoms (ie: fever, flu-like symptoms, shortness of breath, cough etc.) before then, please call [phone].  If you test positive for Covid 19 in the 2 weeks post procedure, please call and report this information to [place].    If any biopsies were taken you will be contacted by phone or by letter within the next 1-3 weeks.  Please call us at [phone] if you have not heard about the biopsies in 3 weeks.    SIGNATURES/CONFIDENTIALITY: You and/or your care partner have signed paperwork which will be entered into your electronic medical record.  These signatures attest to the fact that that the information above on your After Visit Summary has been reviewed and is understood.  Full responsibility of the confidentiality of this discharge information lies with you and/or your care-partner.

## 2019-06-27 NOTE — Progress Notes (Signed)
Report given to PACU, vss 

## 2019-06-27 NOTE — Progress Notes (Signed)
Temperature taken by K.A., VS taken by C.W. 

## 2019-06-27 NOTE — Progress Notes (Signed)
Pt's states no medical or surgical changes since previsit or office visit. 

## 2019-07-01 ENCOUNTER — Encounter: Payer: Self-pay | Admitting: Gastroenterology

## 2019-07-01 ENCOUNTER — Telehealth: Payer: Self-pay

## 2019-07-01 NOTE — Telephone Encounter (Signed)
  Follow up Call-  Call back number 06/27/2019  Post procedure Call Back phone  # [phone]  Permission to leave phone message Yes  Some recent data might be hidden     Patient questions:  Do you have a fever, pain , or abdominal swelling? No. Pain Score  0 *  Have you tolerated food without any problems? Yes.    Have you been able to return to your normal activities? Yes.    Do you have any questions about your discharge instructions: Diet   No. Medications  No. Follow up visit  No.  Do you have questions or concerns about your Care? No.  Actions: * If pain score is 4 or above: No action needed, pain <4.  1. Have you developed a fever since your procedure? no  2.   Have you had an respiratory symptoms (SOB or cough) since your procedure? no  3.   Have you tested positive for COVID 19 since your procedure no  4.   Have you had any family members/close contacts diagnosed with the COVID 19 since your procedure?  no   If yes to any of these questions please route to Joylene John, RN and Alphonsa Gin, Therapist, sports.

## 2019-07-04 ENCOUNTER — Other Ambulatory Visit: Payer: Self-pay | Admitting: Family Medicine

## 2019-07-11 ENCOUNTER — Encounter: Payer: Self-pay | Admitting: Family Medicine

## 2019-07-11 DIAGNOSIS — E118 Type 2 diabetes mellitus with unspecified complications: Secondary | ICD-10-CM

## 2019-07-17 ENCOUNTER — Encounter: Payer: Self-pay | Admitting: Family Medicine

## 2019-07-17 LAB — HM DIABETES EYE EXAM

## 2019-07-28 ENCOUNTER — Ambulatory Visit: Payer: Medicare Other | Admitting: Podiatry

## 2019-07-28 ENCOUNTER — Other Ambulatory Visit: Payer: Self-pay | Admitting: Internal Medicine

## 2019-07-28 ENCOUNTER — Encounter: Payer: Self-pay | Admitting: Podiatry

## 2019-07-28 ENCOUNTER — Other Ambulatory Visit: Payer: Self-pay

## 2019-07-28 DIAGNOSIS — M79675 Pain in left toe(s): Secondary | ICD-10-CM | POA: Diagnosis not present

## 2019-07-28 DIAGNOSIS — E1142 Type 2 diabetes mellitus with diabetic polyneuropathy: Secondary | ICD-10-CM

## 2019-07-28 DIAGNOSIS — B351 Tinea unguium: Secondary | ICD-10-CM

## 2019-07-28 DIAGNOSIS — E1140 Type 2 diabetes mellitus with diabetic neuropathy, unspecified: Secondary | ICD-10-CM | POA: Insufficient documentation

## 2019-07-28 DIAGNOSIS — M79674 Pain in right toe(s): Secondary | ICD-10-CM | POA: Diagnosis not present

## 2019-07-28 NOTE — Progress Notes (Signed)
Complaint:  Visit Type: Patient presents  to my office for  preventative foot care services. Complaint: Patient states" my nails have grown long and thick and become painful to walk and wear shoes" Patient has been diagnosed with DM with no foot complications. The patient presents for preventative foot care services.  Podiatric Exam: Vascular: dorsalis pedis and posterior tibial pulses are weakly  palpable bilateral. Capillary return is immediate. Temperature gradient is WNL. Skin turgor WNL  Sensorium: Absent  Semmes Weinstein monofilament test. Normal tactile sensation bilaterally. Nail Exam: Pt has thick disfigured discolored nails with subungual debris noted bilateral entire nail hallux through fifth toenails Ulcer Exam: There is no evidence of ulcer or pre-ulcerative changes or infection. Orthopedic Exam: Muscle tone and strength are WNL. No limitations in general ROM. No crepitus or effusions noted. Foot type and digits show no abnormalities. Bony prominences are unremarkable. Skin: No Porokeratosis. No infection or ulcers  Diagnosis:  Onychomycosis, , Pain in right toe, pain in left toes  Treatment & Plan Procedures and Treatment: Consent by patient was obtained for treatment procedures.   Debridement of mycotic and hypertrophic toenails, 1 through 5 bilateral and clearing of subungual debris. No ulceration, no infection noted.  Diabetic foot exam reveals weak pulses and absent LOPS. Return Visit-Office Procedure: Patient instructed to return to the office for a follow up visit 3 months for continued evaluation and treatment.    Gardiner Barefoot DPM

## 2019-07-28 NOTE — Telephone Encounter (Signed)
Please schedule overdue 3 mo F/U with Dr. Saunders Revel. Thank you!

## 2019-07-29 NOTE — Telephone Encounter (Signed)
Scheduled

## 2019-08-08 ENCOUNTER — Other Ambulatory Visit: Payer: Self-pay | Admitting: Family Medicine

## 2019-08-29 ENCOUNTER — Other Ambulatory Visit: Payer: Self-pay | Admitting: Family Medicine

## 2019-08-29 ENCOUNTER — Other Ambulatory Visit: Payer: Self-pay | Admitting: Internal Medicine

## 2019-09-17 ENCOUNTER — Ambulatory Visit (INDEPENDENT_AMBULATORY_CARE_PROVIDER_SITE_OTHER): Payer: Medicare PPO | Admitting: Internal Medicine

## 2019-09-17 ENCOUNTER — Other Ambulatory Visit: Payer: Self-pay

## 2019-09-17 ENCOUNTER — Encounter: Payer: Self-pay | Admitting: Internal Medicine

## 2019-09-17 VITALS — BP 152/80 | HR 68 | Ht 70.5 in | Wt 287.8 lb

## 2019-09-17 DIAGNOSIS — I2510 Atherosclerotic heart disease of native coronary artery without angina pectoris: Secondary | ICD-10-CM

## 2019-09-17 DIAGNOSIS — I10 Essential (primary) hypertension: Secondary | ICD-10-CM

## 2019-09-17 DIAGNOSIS — E782 Mixed hyperlipidemia: Secondary | ICD-10-CM | POA: Diagnosis not present

## 2019-09-17 DIAGNOSIS — Z79899 Other long term (current) drug therapy: Secondary | ICD-10-CM | POA: Diagnosis not present

## 2019-09-17 MED ORDER — FISH OIL 1000 MG PO CAPS: 3.0000 | ORAL_CAPSULE | Freq: Every day | ORAL | 2 refills | Status: DC

## 2019-09-17 MED ORDER — LISINOPRIL 10 MG PO TABS: 10.0000 mg | ORAL_TABLET | Freq: Every day | ORAL | 3 refills | Status: DC

## 2019-09-17 NOTE — Patient Instructions (Signed)
Medication Instructions:  Your physician has recommended you make the following change in your medication:  1- INCREASE Lisinopril to 10 mg by mouth once a day. 2- START Fish oil 3000 mg by mouth once a day.  *If you need a refill on your cardiac medications before your next appointment, please call your pharmacy*  Lab Work: Your physician recommends that you return for lab work in: 2 weeks on January 20th. BMET. - Please go to the [facility]. You will check in at the front desk to the right as you walk into the atrium. Valet Parking is offered if needed. - No appointment needed. You may go any day between 7 am and 6 pm.  If you have labs (blood work) drawn today and your tests are completely normal, you will receive your results only by: Marland Kitchen MyChart Message (if you have MyChart) OR . A paper copy in the mail If you have any lab test that is abnormal or we need to change your treatment, we will call you to review the results.  Testing/Procedures: none  Follow-Up: At [facility], you and your health needs are our priority.  As part of our continuing mission to provide you with exceptional heart care, we have created designated Provider Care Teams.  These Care Teams include your primary Cardiologist (physician) and Advanced Practice Providers (APPs -  Physician Assistants and Nurse Practitioners) who all work together to provide you with the care you need, when you need it.  Your next appointment:   6 month(s)  The format for your next appointment:   In Person  Provider:    You may see Nelva Bush, MD or one of the following Advanced Practice Providers on your designated Care Team:    Murray Hodgkins, NP  Christell Faith, PA-C  Marrianne Mood, PA-C

## 2019-09-17 NOTE — Progress Notes (Signed)
Follow-up Outpatient Visit Date: 09/17/2019  Primary Care Provider: Ria Bush, MD [place] [address]  Chief Complaint: Follow-up CAD  HPI:  [name] is a 79 y.o. male with history of three-vessel CAD status post CABG (01/2018)complicated by postoperative atrial fibrillation, hypertension, hyperlipidemia, type 2 diabetes mellitus, and chronic kidney disease stage III, who presents for follow-up of coronary artery disease.  We last spoke in late May via virtual visit, at which time he reported significant improvement in buttock pain after discontinuation of atorvastatin.  He was otherwise doing well without chest pain, shortness of breath, palpitations, and edema.  His blood pressure was elevated, which she attributed to dietary indiscretion.    Today, Mr. Veron reports that he feels well, though he is somewhat frustrated by lack of activity in the setting of COVID-19.  He tries to stay busy around his house.  He denies chest pain, shortness of breath, palpitations, lightheadedness, and edema.  Buttock pain has not been an issue, though he has also not been as active as in years past.  He is tolerating his current medications well.  --------------------------------------------------------------------------------------------------  Cardiovascular History & Procedures: Cardiovascular Problems:  Multivessel coronary artery disease  Risk Factors:  Hypertension, hyperlipidemia, type 2 diabetes mellitus, obesity, male gender, and age greater than 94  Cath/PCI:  LHC (12/21/17): LMCA normal. LAD with sequential 20% proximal and 70-80%mid LAD stenoses. Mid LAD lesion involves moderate-caliber D1 branch, with 70% ostial and 90% mid stenoses Ladona Mow 0,1,1). LargeLCx with small OM1 with 80% stenosis and moderate OM2 with subtotal chronic occlusion and bridging collaterals, and chronic total occlusion of the proximal RCA with left-to-right collaterals filling  the RPDA.  CV Surgery:  CABG (01/18/2018, Dr. Prescott Gum): LIMA to LAD, SVG to diagonal, and SVG to PL branch of RCA. LAD endarterectomy required for bypass grafting.  EP Procedures and Devices:  None  Non-Invasive Evaluation(s):  Exercise MPI (12/12/17): Intermediate risk study with moderate in size, moderate in severity basal and mid inferolateral reversible defect consistent with ischemia. LVEF mildly reduced at 51% with inferolateral hypokinesis. Poor exercise tolerance with frequent PVCs noted during stress.  TTE (11/23/17): Normal LV size with mild LVH. LVEF 50-55% with normal wall motion and grade 1 diastolic dysfunction. Mildly thickened and calcified mitral valve consistent with rheumatic disease. No evidence of stenosis. Trivial regurgitation. Normal RV size and function. Normal PA pressure.  Recent CV Pertinent Labs: Lab Results  Component Value Date   CHOL 194 05/15/2019   CHOL 153 07/11/2018   CHOL 178 02/01/2010   HDL 23.70 (L) 05/15/2019   HDL 34 (L) 07/11/2018   LDLCALC 82 09/25/2018   LDLCALC 79 07/11/2018   LDLDIRECT 32.0 05/15/2019   TRIG (H) 05/26/2019    906.0 Triglyceride is over 400; calculations on Lipids are invalid.   TRIG 812 02/01/2010   CHOLHDL 8 05/15/2019   INR 1.20 01/18/2018   K 4.8 05/15/2019   K 4.5 02/01/2010   MG 2.6 (H) 01/23/2018   BUN 16 05/15/2019   BUN 21 04/11/2018   CREATININE 1.17 05/15/2019   CREATININE 0.95 02/01/2010    Past medical and surgical history were reviewed and updated in EPIC.  Current Meds  Medication Sig  . ACCU-CHEK AVIVA PLUS test strip USE AS DIRECTED TO CHECK BLOOD SUGARS UPTO FOUR TIMES DAILY.  Marland Kitchen aspirin EC 81 MG tablet Take 81 mg by mouth daily.  . blood glucose meter kit and supplies Dispense based on patient and insurance preference.  Use up to four times daily as directed. (FOR ICD-10 E10.9, E11.9).  . fenofibrate (TRICOR) 145 MG tablet Take 1 tablet (145 mg total) by mouth daily.  Marland Kitchen JANUVIA  50 MG tablet TAKE 1 TABLET BY MOUTH ONCE DAILY  . lisinopril (ZESTRIL) 5 MG tablet Take 1 tablet (5 mg total) by mouth daily.  . metFORMIN (GLUCOPHAGE) 500 MG tablet TAKE 1 TABLET BY MOUTH TWICE A DAY  . metoprolol succinate (TOPROL-XL) 25 MG 24 hr tablet TAKE 1 TABLET BY MOUTH ONCE A DAY WITH OR IMMEDIATELY FOLLOWING A MEAL  . Multiple Vitamins-Minerals (MACULAR VITAMIN BENEFIT PO) Take 1 tablet by mouth daily.  . nitroGLYCERIN (NITROSTAT) 0.4 MG SL tablet Place 1 tablet (0.4 mg total) under the tongue every 5 (five) minutes as needed for chest pain.  Marland Kitchen omeprazole (PRILOSEC) 40 MG capsule TAKE 1 CAPSULE BY MOUTH EVERY MONDAY, WEDNESDAY, AND FRIDAY.  . rosuvastatin (CRESTOR) 5 MG tablet TAKE 1 TABLET BY MOUTH ONCE DAILY    Allergies: Patient has no known allergies.  Social History   Tobacco Use  . Smoking status: Never Smoker  . Smokeless tobacco: Former Systems developer    Types: Chew  Substance Use Topics  . Alcohol use: Yes    Comment: occ  . Drug use: No    Family History  Problem Relation Age of Onset  . Alzheimer's disease Mother   . Breast cancer Mother        breast  . Atrial fibrillation Mother   . CAD Mother   . Stroke Father 44  . Diabetes Father   . CAD Father   . Rectal cancer Maternal Grandfather        rectal  . Colon cancer Maternal Grandfather 73  . Colon polyps Sister   . Esophageal cancer Neg Hx   . Stomach cancer Neg Hx     Review of Systems: A 12-system review of systems was performed and was negative except as noted in the HPI.  --------------------------------------------------------------------------------------------------  Physical Exam: BP (!) 152/80 (BP Location: Left Arm, Patient Position: Sitting, Cuff Size: Large)   Pulse 68   Ht 5' 10.5" (1.791 m)   Wt 287 lb 12 oz (130.5 kg)   BMI 40.70 kg/m   General:  NAD.  Accompanied by his wife. HEENT: No conjunctival pallor or scleral icterus. Facemask in place. Neck: Supple without lymphadenopathy,  thyromegaly, JVD, or HJR. Lungs: Normal work of breathing. Clear to auscultation bilaterally without wheezes or crackles. Heart: Regular rate and rhythm without murmurs, rubs, or gallops. Non-displaced PMI. Abd: Bowel sounds present. Soft, NT/ND without hepatosplenomegaly Ext: Trace pretibial edema. Skin: Warm and dry without rash.  EKG:  NSR with poor R wave progression.  Lab Results  Component Value Date   WBC 6.4 05/15/2019   HGB 11.9 (L) 05/15/2019   HCT 35.9 (L) 05/15/2019   MCV 96.8 05/15/2019   PLT 194.0 05/15/2019    Lab Results  Component Value Date   NA 141 05/15/2019   K 4.8 05/15/2019   CL 103 05/15/2019   CO2 29 05/15/2019   BUN 16 05/15/2019   CREATININE 1.17 05/15/2019   GLUCOSE 141 (H) 05/15/2019   ALT 28 05/15/2019    Lab Results  Component Value Date   CHOL 194 05/15/2019   HDL 23.70 (L) 05/15/2019   LDLCALC 82 09/25/2018   LDLDIRECT 32.0 05/15/2019   TRIG (H) 05/26/2019    906.0 Triglyceride is over 400; calculations on Lipids are invalid.   CHOLHDL 8 05/15/2019    --------------------------------------------------------------------------------------------------  ASSESSMENT AND PLAN: Coronary artery disease: No chest pain or other symptoms to suggest worsening coronary insufficiency.  Weight loss encouraged.  Continue ASA, metoprolol, and rosuvastatin for secondary prevention.  Hypertension: BP suboptimally controlled (goal < 130/80).  We will increase lisinopril to 10 mg daily with repeat BMP in ~2 weeks.  Continue other BP meds.  Hyperlipidemia: LDL well controlled but triglycerides still quite elevated.  Patient previously discussed treatment options with lipid clinic, but Vascepa was cost-prohibitive.  Though not as potent, I have recommended adding fish oil 3000 mg daily.  Continue current doses of rosuvastatin and fenofibrate.  Morbid obesity: BMI > 40 with multiple comorbidities (CAD, DM, HTN).  Weight loss encouraged through diet and  exercise.  Follow-up: Return to clinic in 6 months.  Nelva Bush, MD 09/17/2019 3:18 PM

## 2019-09-18 ENCOUNTER — Encounter: Payer: Self-pay | Admitting: Internal Medicine

## 2019-09-21 ENCOUNTER — Other Ambulatory Visit: Payer: Self-pay

## 2019-09-21 ENCOUNTER — Ambulatory Visit: Payer: Medicare Other | Attending: Internal Medicine

## 2019-09-21 DIAGNOSIS — Z23 Encounter for immunization: Secondary | ICD-10-CM

## 2019-09-21 NOTE — Progress Notes (Signed)
   [id number]  Name:  Leemon Ayala.    MRN: [id number] DOB: 06-12-1941  09/21/2019  Mr. Cline was observed post Covid-19 immunization for 15 minutes without incidence. He was provided with Vaccine Information Sheet and instruction to access the V-Safe system.   Mr. Bernard was instructed to call 911 with any severe reactions post vaccine: Marland Kitchen Difficulty breathing  . Swelling of your face and throat  . A fast heartbeat  . A bad rash all over your body  . Dizziness and weakness    Immunizations Administered    Name Date Dose VIS Date Route   Pfizer COVID-19 Vaccine 09/21/2019 11:03 AM 0.3 mL 08/22/2019 Intramuscular   Manufacturer: [employer]   Lot: [id number]   [place]: [id number]

## 2019-09-22 ENCOUNTER — Ambulatory Visit: Payer: Self-pay

## 2019-09-22 ENCOUNTER — Other Ambulatory Visit: Payer: Self-pay | Admitting: Internal Medicine

## 2019-10-01 ENCOUNTER — Other Ambulatory Visit
Admission: RE | Admit: 2019-10-01 | Discharge: 2019-10-01 | Disposition: A | Discharge status: Home / Self Care | Payer: Medicare PPO | Source: Ambulatory Visit | Attending: Internal Medicine | Admitting: Internal Medicine

## 2019-10-01 DIAGNOSIS — I10 Essential (primary) hypertension: Secondary | ICD-10-CM | POA: Diagnosis not present

## 2019-10-01 DIAGNOSIS — Z79899 Other long term (current) drug therapy: Secondary | ICD-10-CM | POA: Diagnosis not present

## 2019-10-01 DIAGNOSIS — I2510 Atherosclerotic heart disease of native coronary artery without angina pectoris: Secondary | ICD-10-CM | POA: Insufficient documentation

## 2019-10-01 LAB — BASIC METABOLIC PANEL
Anion gap: 9 (ref 5–15)
BUN: 26 mg/dL — ABNORMAL HIGH (ref 8–23)
CO2: 24 mmol/L (ref 22–32)
Calcium: 8.9 mg/dL (ref 8.9–10.3)
Chloride: 109 mmol/L (ref 98–111)
Creatinine, Ser: 1.37 mg/dL — ABNORMAL HIGH (ref 0.61–1.24)
GFR calc Af Amer: 57 mL/min — ABNORMAL LOW (ref >60)
GFR calc non Af Amer: 49 mL/min — ABNORMAL LOW (ref >60)
Glucose, Bld: 131 mg/dL — ABNORMAL HIGH (ref 70–99)
Potassium: 3.9 mmol/L (ref 3.5–5.1)
Sodium: 142 mmol/L (ref 135–145)

## 2019-10-11 ENCOUNTER — Ambulatory Visit: Payer: Medicare PPO | Attending: Internal Medicine

## 2019-10-11 DIAGNOSIS — Z23 Encounter for immunization: Secondary | ICD-10-CM

## 2019-10-11 NOTE — Progress Notes (Signed)
   [id number]  Name:  [name] Young.    MRN: [id number] DOB: [birth year]  10/11/2019  [name] was observed post Covid-19 immunization for 15 minutes without incidence. He was provided with Vaccine Information Sheet and instruction to access the V-Safe system.   [name] was instructed to call 911 with any severe reactions post vaccine: Marland Kitchen Difficulty breathing  . Swelling of your face and throat  . A fast heartbeat  . A bad rash all over your body  . Dizziness and weakness    Immunizations Administered    Name Date Dose VIS Date Route   Pfizer COVID-19 Vaccine 10/11/2019 10:10 AM 0.3 mL 08/22/2019 Intramuscular   Manufacturer: [place]   Lot: [id number]   [place]: [id number]

## 2019-10-26 ENCOUNTER — Encounter: Payer: Self-pay | Admitting: Family Medicine

## 2019-10-27 ENCOUNTER — Other Ambulatory Visit: Payer: Self-pay

## 2019-10-27 ENCOUNTER — Encounter: Payer: Self-pay | Admitting: Podiatry

## 2019-10-27 ENCOUNTER — Ambulatory Visit: Payer: Medicare PPO | Admitting: Podiatry

## 2019-10-27 DIAGNOSIS — M79674 Pain in right toe(s): Secondary | ICD-10-CM

## 2019-10-27 DIAGNOSIS — B351 Tinea unguium: Secondary | ICD-10-CM | POA: Diagnosis not present

## 2019-10-27 DIAGNOSIS — E1142 Type 2 diabetes mellitus with diabetic polyneuropathy: Secondary | ICD-10-CM

## 2019-10-27 DIAGNOSIS — M79675 Pain in left toe(s): Secondary | ICD-10-CM

## 2019-10-27 NOTE — Progress Notes (Signed)
Complaint:  Visit Type: Patient presents  to my office for  preventative foot care services. Complaint: Patient states" my nails have grown long and thick and become painful to walk and wear shoes" Patient has been diagnosed with DM with no foot complications. The patient presents for preventative foot care services.  Podiatric Exam: Vascular: dorsalis pedis and posterior tibial pulses are weakly  palpable bilateral. Capillary return is immediate. Temperature gradient is WNL. Skin turgor WNL  Sensorium: Absent  Semmes Weinstein monofilament test. Normal tactile sensation bilaterally. Nail Exam: Pt has thick disfigured discolored nails with subungual debris noted bilateral entire nail hallux through fifth toenails Ulcer Exam: There is no evidence of ulcer or pre-ulcerative changes or infection. Orthopedic Exam: Muscle tone and strength are WNL. No limitations in general ROM. No crepitus or effusions noted. Foot type and digits show no abnormalities. Bony prominences are unremarkable. Skin: No Porokeratosis. No infection or ulcers  Diagnosis:  Onychomycosis, , Pain in right toe, pain in left toes  Treatment & Plan Procedures and Treatment: Consent by patient was obtained for treatment procedures.   Debridement of mycotic and hypertrophic toenails, 1 through 5 bilateral and clearing of subungual debris. No ulceration, no infection noted.  Diabetic foot exam reveals weak pulses and absent LOPS. Scant pus lateral border right great toe.  Neosporin/DSD. Return Visit-Office Procedure: Patient instructed to return to the office for a follow up visit 3 months for continued evaluation and treatment.    Gardiner Barefoot DPM

## 2019-11-03 ENCOUNTER — Other Ambulatory Visit: Payer: Self-pay | Admitting: Family Medicine

## 2019-11-20 ENCOUNTER — Other Ambulatory Visit: Payer: Self-pay

## 2019-11-20 ENCOUNTER — Ambulatory Visit (INDEPENDENT_AMBULATORY_CARE_PROVIDER_SITE_OTHER): Payer: Medicare PPO | Admitting: Family Medicine

## 2019-11-20 ENCOUNTER — Encounter: Payer: Self-pay | Admitting: Family Medicine

## 2019-11-20 VITALS — BP 140/66 | HR 66 | Temp 98.3°F | Ht 70.5 in | Wt 281.1 lb

## 2019-11-20 DIAGNOSIS — E785 Hyperlipidemia, unspecified: Secondary | ICD-10-CM

## 2019-11-20 DIAGNOSIS — I10 Essential (primary) hypertension: Secondary | ICD-10-CM

## 2019-11-20 DIAGNOSIS — E118 Type 2 diabetes mellitus with unspecified complications: Secondary | ICD-10-CM

## 2019-11-20 LAB — POCT GLYCOSYLATED HEMOGLOBIN (HGB A1C): Hemoglobin A1C: 5.8 % — AB (ref 4.0–5.6)

## 2019-11-20 NOTE — Assessment & Plan Note (Signed)
Chronic, a bit high but stable on current regimen.

## 2019-11-20 NOTE — Progress Notes (Signed)
This visit was conducted in person.  BP 140/66 (BP Location: Left Arm, Patient Position: Sitting, Cuff Size: Large)   Pulse 66   Temp 98.3 F (36.8 C) (Temporal)   Ht 5' 10.5" (1.791 m)   Wt 281 lb 1 oz (127.5 kg)   SpO2 96%   BMI 39.76 kg/m    CC: 6 mo f/u visit  Subjective:    Patient ID: [name] Young., male    DOB: 05/17/1941, 79 y.o.   MRN: [id number]  HPI: Lerry Cordrey. is a 79 y.o. male presenting on 11/20/2019 for Follow-up (Here for 6 mo f/u.  Pt accompanied by wife, Barnetta Chapel- temp 97.9.)   Recently saw cardiology. Lisinopril increased to 63m daily. Fish oil 30064mdaily added to crestor and fenofibrate. 6 lb weight loss since last visit.   Several close falls, avoided with his walking stick. No premonitory symptoms - trips over vines outside.   DM - does not regularly check sugars. Compliant with antihyperglycemic regimen which includes: metformin 50054mid and januvia 25m56mily. Denies low sugars or hypoglycemic symptoms. Denies paresthesias. Last diabetic eye exam DUE - appt scheduled later this year. Pneumovax: 2013. Prevnar: 2015. Glucometer brand: Accuchek. DSME: declines. Now seeing podiatry.  Lab Results  Component Value Date   HGBA1C 5.8 (A) 11/20/2019   Diabetic Foot Exam - Simple   No data filed     Lab Results  Component Value Date   MICROALBUR 1.7 05/26/2019         Relevant past medical, surgical, family and social history reviewed and updated as indicated. Interim medical history since our last visit reviewed. Allergies and medications reviewed and updated. Outpatient Medications Prior to Visit  Medication Sig Dispense Refill  . ACCU-CHEK AVIVA PLUS test strip USE AS DIRECTED TO CHECK BLOOD SUGARS UPTO FOUR TIMES DAILY. 100 each 2  . aspirin EC 81 MG tablet Take 81 mg by mouth daily.    . blood glucose meter kit and supplies Dispense based on patient and insurance preference. Use up to four times daily as directed. (FOR ICD-10  E10.9, E11.9). 1 each 0  . fenofibrate (TRICOR) 145 MG tablet Take 1 tablet (145 mg total) by mouth daily. 90 tablet 3  . JANUVIA 50 MG tablet TAKE 1 TABLET BY MOUTH ONCE DAILY 90 tablet 0  . lisinopril (ZESTRIL) 10 MG tablet Take 1 tablet (10 mg total) by mouth daily. 90 tablet 3  . metFORMIN (GLUCOPHAGE) 500 MG tablet Take 1 tablet (500 mg total) by mouth daily with breakfast.    . metoprolol succinate (TOPROL-XL) 25 MG 24 hr tablet TAKE 1 TABLET BY MOUTH ONCE A DAY WITH OR IMMEDIATELY FOLLOWING A MEAL 90 tablet 0  . Multiple Vitamins-Minerals (MACULAR VITAMIN BENEFIT PO) Take 1 tablet by mouth daily.    . nitroGLYCERIN (NITROSTAT) 0.4 MG SL tablet Place 1 tablet (0.4 mg total) under the tongue every 5 (five) minutes as needed for chest pain. 30 tablet prn  . Omega-3 Fatty Acids (FISH OIL) 1000 MG CAPS Take 3 capsules (3,000 mg total) by mouth daily. 270 capsule 2  . omeprazole (PRILOSEC) 40 MG capsule TAKE 1 CAPSULE BY MOUTH EVERY MONDAY, WEDNESDAY, AND FRIDAY. 30 capsule 3  . rosuvastatin (CRESTOR) 5 MG tablet TAKE 1 TABLET BY MOUTH ONCE DAILY 30 tablet 5  . metFORMIN (GLUCOPHAGE) 500 MG tablet TAKE 1 TABLET BY MOUTH TWICE A DAY 180 tablet 1   No facility-administered medications prior to visit.  Per HPI unless specifically indicated in ROS section below Review of Systems Objective:    BP 140/66 (BP Location: Left Arm, Patient Position: Sitting, Cuff Size: Large)   Pulse 66   Temp 98.3 F (36.8 C) (Temporal)   Ht 5' 10.5" (1.791 m)   Wt 281 lb 1 oz (127.5 kg)   SpO2 96%   BMI 39.76 kg/m   Wt Readings from Last 3 Encounters:  11/20/19 281 lb 1 oz (127.5 kg)  09/17/19 287 lb 12 oz (130.5 kg)  06/27/19 (!) 302 lb 3.2 oz (137.1 kg)    Physical Exam Vitals and nursing note reviewed.  Constitutional:      General: He is not in acute distress.    Appearance: He is well-developed. He is obese.  HENT:     Head: Normocephalic and atraumatic.  Eyes:     General: No scleral  icterus.    Conjunctiva/sclera: Conjunctivae normal.     Pupils: Pupils are equal, round, and reactive to light.  Cardiovascular:     Rate and Rhythm: Normal rate and regular rhythm.     Heart sounds: Normal heart sounds. No murmur.  Pulmonary:     Effort: Pulmonary effort is normal. No respiratory distress.     Breath sounds: Normal breath sounds. No wheezing or rales.  Musculoskeletal:     Cervical back: Normal range of motion and neck supple.     Comments: See HPI for foot exam if done  Lymphadenopathy:     Cervical: No cervical adenopathy.  Skin:    General: Skin is warm and dry.     Findings: No rash.       Results for orders placed or performed in visit on 11/20/19  POCT glycosylated hemoglobin (Hb A1C)  Result Value Ref Range   Hemoglobin A1C 5.8 (A) 4.0 - 5.6 %   HbA1c POC (<> result, manual entry)     HbA1c, POC (prediabetic range)     HbA1c, POC (controlled diabetic range)     Assessment & Plan:  This visit occurred during the SARS-CoV-2 public health emergency.  Safety protocols were in place, including screening questions prior to the visit, additional usage of staff PPE, and extensive cleaning of exam room while observing appropriate contact time as indicated for disinfecting solutions.   Problem List Items Addressed This Visit    Severe obesity (BMI 35.0-39.9) with comorbidity ([place])    Congratulated on weight loss. Encouraged ongoing weight loss efforts, continue low sugar diabetic diet      Relevant Medications   metFORMIN (GLUCOPHAGE) 500 MG tablet   Hyperlipidemia LDL goal <70    Tolerating fish oil. Triglycerides recently high. Continue crestor and fibrate. Not fasting today.       Essential hypertension    Chronic, a bit high but stable on current regimen.      Controlled diabetes mellitus type 2 with complications (HCC) - Primary    Chronic, improvement noted. Will decrease metformin to 566m daily. Declines DSME. Has established with podiatry for  foot exam. RTC 6 mo CPE.       Relevant Medications   metFORMIN (GLUCOPHAGE) 500 MG tablet   Other Relevant Orders   POCT glycosylated hemoglobin (Hb A1C) (Completed)       No orders of the defined types were placed in this encounter.  Orders Placed This Encounter  Procedures  . POCT glycosylated hemoglobin (Hb A1C)    Patient Instructions  We will request latest eye doctor appointment (Dr DBing Plume 07/2019 Drop  metformin to 1 a day.  Keep working on low sugar diet.  You are doing well today.    Follow up plan: Return in about 6 months (around 05/22/2020) for annual exam, prior fasting for blood work, medicare wellness visit.  Ria Bush, MD

## 2019-11-20 NOTE — Assessment & Plan Note (Signed)
Tolerating fish oil. Triglycerides recently high. Continue crestor and fibrate. Not fasting today.

## 2019-11-20 NOTE — Patient Instructions (Addendum)
We will request latest eye doctor appointment (Dr Bing Plume) 07/2019 Drop metformin to 1 a day.  Keep working on low sugar diet.  You are doing well today.

## 2019-11-20 NOTE — Assessment & Plan Note (Signed)
Congratulated on weight loss. Encouraged ongoing weight loss efforts, continue low sugar diabetic diet

## 2019-11-20 NOTE — Assessment & Plan Note (Signed)
Chronic, improvement noted. Will decrease metformin to 500mg  daily. Declines DSME. Has established with podiatry for foot exam. RTC 6 mo CPE.

## 2019-12-01 ENCOUNTER — Encounter: Payer: Self-pay | Admitting: Family Medicine

## 2019-12-01 DIAGNOSIS — H353131 Nonexudative age-related macular degeneration, bilateral, early dry stage: Secondary | ICD-10-CM | POA: Insufficient documentation

## 2019-12-08 ENCOUNTER — Other Ambulatory Visit: Payer: Self-pay | Admitting: Internal Medicine

## 2020-01-26 ENCOUNTER — Encounter: Payer: Self-pay | Admitting: Podiatry

## 2020-01-26 ENCOUNTER — Ambulatory Visit: Payer: Medicare PPO | Admitting: Podiatry

## 2020-01-26 ENCOUNTER — Other Ambulatory Visit: Payer: Self-pay

## 2020-01-26 DIAGNOSIS — B351 Tinea unguium: Secondary | ICD-10-CM | POA: Diagnosis not present

## 2020-01-26 DIAGNOSIS — M79674 Pain in right toe(s): Secondary | ICD-10-CM | POA: Diagnosis not present

## 2020-01-26 DIAGNOSIS — E1142 Type 2 diabetes mellitus with diabetic polyneuropathy: Secondary | ICD-10-CM | POA: Diagnosis not present

## 2020-01-26 DIAGNOSIS — M79675 Pain in left toe(s): Secondary | ICD-10-CM

## 2020-01-26 NOTE — Progress Notes (Signed)
This patient returns to my office for at risk foot care.  This patient requires this care by a professional since this patient will be at risk due to having diabetres and neuropathy.  Patient presents to the office with her husband.  This patient is unable to cut nails himself since the patient cannot reach his nails.These nails are painful walking and wearing shoes.  This patient presents for at risk foot care today.  General Appearance  Alert, conversant and in no acute stress.  Vascular  Dorsalis pedis and posterior tibial  pulses are weakly  palpable  bilaterally.  Capillary return is within normal limits  bilaterally. Temperature is within normal limits  bilaterally.  Neurologic  Senn-Weinstein monofilament wire test absent  bilaterally. Muscle power within normal limits bilaterally.  Nails Thick disfigured discolored nails with subungual debris  from hallux to fifth toes bilaterally. No evidence of bacterial infection or drainage bilaterally.  Orthopedic  No limitations of motion  feet .  No crepitus or effusions noted.  No bony pathology or digital deformities noted.  Skin  normotropic skin with no porokeratosis noted bilaterally.  No signs of infections or ulcers noted.     Onychomycosis  Pain in right toes  Pain in left toes  Consent was obtained for treatment procedures.   Mechanical debridement of nails 1-5  bilaterally performed with a nail nipper.  Filed with dremel without incident.    Return office visit   3 months                   Told patient to return for periodic foot care and evaluation due to potential at risk complications.   Gardiner Barefoot DPM

## 2020-02-16 ENCOUNTER — Other Ambulatory Visit: Payer: Self-pay | Admitting: Internal Medicine

## 2020-03-09 ENCOUNTER — Other Ambulatory Visit: Payer: Self-pay | Admitting: Internal Medicine

## 2020-03-09 ENCOUNTER — Other Ambulatory Visit: Payer: Self-pay | Admitting: Family Medicine

## 2020-03-15 ENCOUNTER — Other Ambulatory Visit: Payer: Self-pay | Admitting: Family Medicine

## 2020-03-15 DIAGNOSIS — E118 Type 2 diabetes mellitus with unspecified complications: Secondary | ICD-10-CM

## 2020-03-16 NOTE — Telephone Encounter (Signed)
E-scribed refills.  

## 2020-03-24 NOTE — Progress Notes (Addendum)
Follow-up Outpatient Visit Date: 03/26/2020  Primary Care Provider: Ria Bush, MD [place] [address]  Chief Complaint: Follow-up coronary artery disease  HPI:  Mr. Loeffelholz is a 79 y.o. male with history of three-vessel CAD status post CABG (01/2018)complicated by postoperative atrial fibrillation, hypertension, hyperlipidemia, type 2 diabetes mellitus, and chronic kidney disease stage III, who presents for follow-up of coronary artery disease.  I last saw Mr. Winnie in January, at which time he was doing relatively well, though he was frustrated by his lack of activity in the setting of COVID-19 pandemic.  He denies chest pain and shortness of breath.  Previous buttock pain was no longer bothering him.  Due to suboptimal blood pressure control, we increase lisinopril to 10 mg daily.  Mr. Sylla was also started on fish oil 3000 mg daily for his hypertriglyceridemia (Vascepa was cost prohibitive).  Today, Mr. Mancha reports he has been doing relatively well.  He still has difficulty ambulating due to weakness in his legs.  He does not have any chest pain, shortness of breath, palpitations, lightheadedness, or edema.  Previously described gluteal pain with activity has remained absent.  He does not check his blood pressure regularly.  He wonders if intermittent salt intake may have driven up his blood pressure today.  --------------------------------------------------------------------------------------------------  Cardiovascular History & Procedures: Cardiovascular Problems:  Multivessel coronary artery disease  Risk Factors:  Hypertension, hyperlipidemia, type 2 diabetes mellitus, obesity, male gender, and age greater than 65  Cath/PCI:  LHC (12/21/17): LMCA normal. LAD with sequential 20% proximal and 70-80%mid LAD stenoses. Mid LAD lesion involves moderate-caliber D1 branch, with 70% ostial and 90% mid stenoses Ladona Mow 0,1,1). LargeLCx with  small OM1 with 80% stenosis and moderate OM2 with subtotal chronic occlusion and bridging collaterals, and chronic total occlusion of the proximal RCA with left-to-right collaterals filling the RPDA.  CV Surgery:  CABG (01/18/2018, Dr. Prescott Gum): LIMA to LAD, SVG to diagonal, and SVG to PL branch of RCA. LAD endarterectomy required for bypass grafting.  EP Procedures and Devices:  None  Non-Invasive Evaluation(s):  Exercise MPI (12/12/17): Intermediate risk study with moderate in size, moderate in severity basal and mid inferolateral reversible defect consistent with ischemia. LVEF mildly reduced at 51% with inferolateral hypokinesis. Poor exercise tolerance with frequent PVCs noted during stress.  TTE (11/23/17): Normal LV size with mild LVH. LVEF 50-55% with normal wall motion and grade 1 diastolic dysfunction. Mildly thickened and calcified mitral valve consistent with rheumatic disease. No evidence of stenosis. Trivial regurgitation. Normal RV size and function. Normal PA pressure.  Recent CV Pertinent Labs: Lab Results  Component Value Date   CHOL 194 05/15/2019   CHOL 153 07/11/2018   CHOL 178 02/01/2010   HDL 23.70 (L) 05/15/2019   HDL 34 (L) 07/11/2018   LDLCALC 82 09/25/2018   LDLCALC 79 07/11/2018   LDLDIRECT 32.0 05/15/2019   TRIG (H) 05/26/2019    906.0 Triglyceride is over 400; calculations on Lipids are invalid.   TRIG 812 02/01/2010   CHOLHDL 8 05/15/2019   INR 1.20 01/18/2018   K 4.2 03/26/2020   K 4.5 02/01/2010   MG 2.6 (H) 01/23/2018   BUN 24 03/26/2020   CREATININE 1.46 (H) 03/26/2020   CREATININE 0.95 02/01/2010    Past medical and surgical history were reviewed and updated in EPIC.  Current Meds  Medication Sig  . ACCU-CHEK AVIVA PLUS test strip USE AS DIRECTED TO CHECK BLOOD SUGARS UPTO FOUR TIMES DAILY.  Marland Kitchen  aspirin EC 81 MG tablet Take 81 mg by mouth daily.  . blood glucose meter kit and supplies Dispense based on patient and insurance  preference. Use up to four times daily as directed. (FOR ICD-10 E10.9, E11.9).  . fenofibrate (TRICOR) 145 MG tablet Take 1 tablet (145 mg total) by mouth daily.  Marland Kitchen JANUVIA 50 MG tablet TAKE 1 TABLET BY MOUTH ONCE DAILY  . metFORMIN (GLUCOPHAGE) 500 MG tablet Take 1 tablet (500 mg total) by mouth daily with breakfast.  . metoprolol succinate (TOPROL-XL) 25 MG 24 hr tablet TAKE 1 TABLET BY MOUTH ONCE A DAY WITH OR IMMEDIATELY FOLLOWING A MEAL  . Multiple Vitamins-Minerals (MACULAR VITAMIN BENEFIT PO) Take 1 tablet by mouth daily.  . Omega-3 Fatty Acids (FISH OIL) 1000 MG CAPS Take 3 capsules (3,000 mg total) by mouth daily.  Marland Kitchen omeprazole (PRILOSEC) 40 MG capsule TAKE 1 CAPSULE BY MOUTH EVERY MONDAY, WEDNESDAY, AND FRIDAY.  . rosuvastatin (CRESTOR) 5 MG tablet TAKE 1 TABLET BY MOUTH ONCE DAILY  . [DISCONTINUED] lisinopril (ZESTRIL) 10 MG tablet Take 1 tablet (10 mg total) by mouth daily.  . [DISCONTINUED] nitroGLYCERIN (NITROSTAT) 0.4 MG SL tablet Place 1 tablet (0.4 mg total) under the tongue every 5 (five) minutes as needed for chest pain.    Allergies: Patient has no known allergies.  Social History   Tobacco Use  . Smoking status: Never Smoker  . Smokeless tobacco: Former Systems developer    Types: Secondary school teacher  . Vaping Use: Never used  Substance Use Topics  . Alcohol use: Yes    Comment: occ  . Drug use: No    Family History  Problem Relation Age of Onset  . Alzheimer's disease Mother   . Breast cancer Mother        breast  . Atrial fibrillation Mother   . CAD Mother   . Stroke Father 7  . Diabetes Father   . CAD Father   . Rectal cancer Maternal Grandfather        rectal  . Colon cancer Maternal Grandfather 47  . Colon polyps Sister   . Esophageal cancer Neg Hx   . Stomach cancer Neg Hx     Review of Systems: A 12-system review of systems was performed and was negative except as noted in the  HPI.  --------------------------------------------------------------------------------------------------  Physical Exam: BP 140/84 (BP Location: Left Arm, Patient Position: Sitting, Cuff Size: Large)   Pulse 60   Ht 5' 10"  (1.778 m)   Wt 274 lb (124.3 kg)   SpO2 96%   BMI 39.31 kg/m   General: NAD.  Accompanied by his wife. Neck: No JVD or HJR. Lungs: Clear to auscultation without wheezes or crackles. Heart: Regular rate and rhythm without murmurs, rubs, or gallops. Abdomen: Soft, nontender, nondistended. Extremities: Trace pretibial edema bilaterally.  EKG: Normal sinus rhythm with nonspecific ST segment changes.  No significant change from prior tracing on 09/17/2019.  Lab Results  Component Value Date   WBC 6.4 05/15/2019   HGB 11.9 (L) 05/15/2019   HCT 35.9 (L) 05/15/2019   MCV 96.8 05/15/2019   PLT 194.0 05/15/2019    Lab Results  Component Value Date   NA 140 03/26/2020   K 4.2 03/26/2020   CL 105 03/26/2020   CO2 22 03/26/2020   BUN 24 03/26/2020   CREATININE 1.46 (H) 03/26/2020   GLUCOSE 132 (H) 03/26/2020   ALT 28 05/15/2019    Lab Results  Component Value Date   CHOL  194 05/15/2019   HDL 23.70 (L) 05/15/2019   LDLCALC 82 09/25/2018   LDLDIRECT 32.0 05/15/2019   TRIG (H) 05/26/2019    906.0 Triglyceride is over 400; calculations on Lipids are invalid.   CHOLHDL 8 05/15/2019    --------------------------------------------------------------------------------------------------  ASSESSMENT AND PLAN: Coronary artery disease: Mr. Hildebran is stable without symptoms to suggest worsening coronary insufficiency.  Continue current medications for secondary prevention.  I encouraged him to increase his activity, as tolerated.  Hypertension: Blood pressure suboptimally controlled with goal pressure less than 130/80.  We have discussed the importance of sodium restriction.  We will increase lisinopril to 20 mg daily.  I will check a BMP today, as well as a CMP in  about 2 weeks.  Mixed hyperlipidemia: Mr. Wotton is tolerating his current regimen of rosuvastatin, fenofibrate, and fish oil.  We will repeat a fasting lipid panel and CMP in about 2 weeks to assess his lipids and LFTs.  Morbid obesity: BMI greater than 35 with multiple comorbidities.  Weight has come down since our last visit.  He should continue to work on weight loss through diet and exercise.  Follow-up: Return to clinic in 6 months.  Nelva Bush, MD 03/27/2020 2:04 PM

## 2020-03-26 ENCOUNTER — Ambulatory Visit: Payer: Medicare PPO | Admitting: Internal Medicine

## 2020-03-26 ENCOUNTER — Other Ambulatory Visit: Payer: Self-pay

## 2020-03-26 ENCOUNTER — Encounter: Payer: Self-pay | Admitting: Internal Medicine

## 2020-03-26 VITALS — BP 140/84 | HR 60 | Ht 70.0 in | Wt 274.0 lb

## 2020-03-26 DIAGNOSIS — E782 Mixed hyperlipidemia: Secondary | ICD-10-CM | POA: Diagnosis not present

## 2020-03-26 DIAGNOSIS — I10 Essential (primary) hypertension: Secondary | ICD-10-CM | POA: Diagnosis not present

## 2020-03-26 DIAGNOSIS — I2510 Atherosclerotic heart disease of native coronary artery without angina pectoris: Secondary | ICD-10-CM

## 2020-03-26 DIAGNOSIS — E785 Hyperlipidemia, unspecified: Secondary | ICD-10-CM | POA: Diagnosis not present

## 2020-03-26 DIAGNOSIS — Z79899 Other long term (current) drug therapy: Secondary | ICD-10-CM

## 2020-03-26 MED ORDER — LISINOPRIL 20 MG PO TABS: 20.0000 mg | ORAL_TABLET | Freq: Every day | ORAL | 1 refills | Status: DC

## 2020-03-26 MED ORDER — NITROGLYCERIN 0.4 MG SL SUBL: 0.4000 mg | SUBLINGUAL_TABLET | SUBLINGUAL | 4 refills | Status: DC | PRN

## 2020-03-26 NOTE — Patient Instructions (Signed)
Medication Instructions:  Your physician has recommended you make the following change in your medication:  1- INCREASE Lisinopril to 20 mg by mouth once a day. 2- Nitroglycerin 0.4 mg tablets - Dissolve one tablet under the tongue every 5 minutes AS NEEDED for chest pain. No more than 3 doses. If chest pain does not resolve, then call 911 or go to the Emergency room.   *If you need a refill on your cardiac medications before your next appointment, please call your pharmacy*   Lab Work: 1- Your physician recommends that you return for lab work in: [place].  2- Your physician recommends that you return for lab work in: [place] ~ 04/09/20 AT [place]. - Will check fasting Lipid panel and CMP. - You will need to be fasting. Please do not have anything to eat or drink after midnight the morning you have the lab work. You may only have water or black coffee with no cream or sugar. - Please go to the [facility]. You will check in at the front desk to the right as you walk into the atrium. Valet Parking is offered if needed. - No appointment needed. You may go any day between 7 am and 6 pm.  If you have labs (blood work) drawn today and your tests are completely normal, you will receive your results only by: Marland Kitchen MyChart Message (if you have MyChart) OR . A paper copy in the mail If you have any lab test that is abnormal or we need to change your treatment, we will call you to review the results.   Testing/Procedures: none   Follow-Up: At [facility], you and your health needs are our priority.  As part of our continuing mission to provide you with exceptional heart care, we have created designated Provider Care Teams.  These Care Teams include your primary Cardiologist (physician) and Advanced Practice Providers (APPs -  Physician Assistants and Nurse Practitioners) who all work together to provide you with the care you need, when you need it.  We recommend signing up for  the patient portal called "MyChart".  Sign up information is provided on this After Visit Summary.  MyChart is used to connect with patients for Virtual Visits (Telemedicine).  Patients are able to view lab/test results, encounter notes, upcoming appointments, etc.  Non-urgent messages can be sent to your provider as well.   To learn more about what you can do with MyChart, go to [email or web address].    Your next appointment:   6 month(s)  The format for your next appointment:   In Person  Provider:    You may see Nelva Bush, MD or one of the following Advanced Practice Providers on your designated Care Team:    Murray Hodgkins, NP  Christell Faith, PA-C  Marrianne Mood, PA-C

## 2020-03-27 LAB — BASIC METABOLIC PANEL
BUN/Creatinine Ratio: 16 (ref 10–24)
BUN: 24 mg/dL (ref 8–27)
CO2: 22 mmol/L (ref 20–29)
Calcium: 9.5 mg/dL (ref 8.6–10.2)
Chloride: 105 mmol/L (ref 96–106)
Creatinine, Ser: 1.46 mg/dL — ABNORMAL HIGH (ref 0.76–1.27)
GFR calc Af Amer: 53 mL/min/{1.73_m2} — ABNORMAL LOW (ref >59)
GFR calc non Af Amer: 46 mL/min/{1.73_m2} — ABNORMAL LOW (ref >59)
Glucose: 132 mg/dL — ABNORMAL HIGH (ref 65–99)
Potassium: 4.2 mmol/L (ref 3.5–5.2)
Sodium: 140 mmol/L (ref 134–144)

## 2020-04-09 ENCOUNTER — Other Ambulatory Visit
Admission: RE | Admit: 2020-04-09 | Discharge: 2020-04-09 | Disposition: A | Discharge status: Home / Self Care | Payer: Medicare PPO | Attending: Internal Medicine | Admitting: Internal Medicine

## 2020-04-09 ENCOUNTER — Telehealth: Payer: Self-pay | Admitting: *Deleted

## 2020-04-09 DIAGNOSIS — Z79899 Other long term (current) drug therapy: Secondary | ICD-10-CM

## 2020-04-09 DIAGNOSIS — I10 Essential (primary) hypertension: Secondary | ICD-10-CM | POA: Diagnosis not present

## 2020-04-09 DIAGNOSIS — I2510 Atherosclerotic heart disease of native coronary artery without angina pectoris: Secondary | ICD-10-CM

## 2020-04-09 DIAGNOSIS — E785 Hyperlipidemia, unspecified: Secondary | ICD-10-CM | POA: Insufficient documentation

## 2020-04-09 LAB — LIPID PANEL
Cholesterol: 142 mg/dL (ref 0–200)
HDL: 24 mg/dL — ABNORMAL LOW (ref >40)
LDL Cholesterol: 60 mg/dL (ref 0–99)
Total CHOL/HDL Ratio: 5.9 RATIO
Triglycerides: 290 mg/dL — ABNORMAL HIGH (ref <150)
VLDL: 58 mg/dL — ABNORMAL HIGH (ref 0–40)

## 2020-04-09 LAB — COMPREHENSIVE METABOLIC PANEL
ALT: 11 U/L (ref 0–44)
AST: 15 U/L (ref 15–41)
Albumin: 3.9 g/dL (ref 3.5–5.0)
Alkaline Phosphatase: 29 U/L — ABNORMAL LOW (ref 38–126)
Anion gap: 10 (ref 5–15)
BUN: 29 mg/dL — ABNORMAL HIGH (ref 8–23)
CO2: 26 mmol/L (ref 22–32)
Calcium: 8.9 mg/dL (ref 8.9–10.3)
Chloride: 105 mmol/L (ref 98–111)
Creatinine, Ser: 1.61 mg/dL — ABNORMAL HIGH (ref 0.61–1.24)
GFR calc Af Amer: 47 mL/min — ABNORMAL LOW (ref >60)
GFR calc non Af Amer: 41 mL/min — ABNORMAL LOW (ref >60)
Glucose, Bld: 134 mg/dL — ABNORMAL HIGH (ref 70–99)
Potassium: 4.1 mmol/L (ref 3.5–5.1)
Sodium: 141 mmol/L (ref 135–145)
Total Bilirubin: 0.8 mg/dL (ref 0.3–1.2)
Total Protein: 7.1 g/dL (ref 6.5–8.1)

## 2020-04-09 NOTE — Telephone Encounter (Signed)
Results called to patient's wife, ok per DPR. She verbalized understanding of results and to have repeat lab work in 2 weeks at [facility].  We also discussed how to lower triglycerides.  She was appreciative.

## 2020-04-09 NOTE — Telephone Encounter (Signed)
-----   Message from Nelva Bush, MD sent at 04/09/2020 11:04 AM EDT ----- Please let Mr. Decarlo know that his cholesterol has improved and that his LDL remains at goal.  His triglycerides remain mildly elevated.  Creatinine has increased slightly from prior assay with escalation of lisinopril.  I recommend that Mr. Colson try to increase his water intake and to continue his current medications.  We should recheck a BMP in ~2 weeks to ensure stable/improved creatinine.

## 2020-04-19 ENCOUNTER — Other Ambulatory Visit: Payer: Self-pay | Admitting: Internal Medicine

## 2020-04-23 ENCOUNTER — Other Ambulatory Visit
Admission: RE | Admit: 2020-04-23 | Discharge: 2020-04-23 | Disposition: A | Discharge status: Home / Self Care | Payer: Medicare PPO | Attending: Internal Medicine | Admitting: Internal Medicine

## 2020-04-23 DIAGNOSIS — E785 Hyperlipidemia, unspecified: Secondary | ICD-10-CM | POA: Diagnosis not present

## 2020-04-23 DIAGNOSIS — Z79899 Other long term (current) drug therapy: Secondary | ICD-10-CM | POA: Diagnosis not present

## 2020-04-23 DIAGNOSIS — I2510 Atherosclerotic heart disease of native coronary artery without angina pectoris: Secondary | ICD-10-CM | POA: Insufficient documentation

## 2020-04-23 LAB — BASIC METABOLIC PANEL
Anion gap: 10 (ref 5–15)
BUN: 25 mg/dL — ABNORMAL HIGH (ref 8–23)
CO2: 26 mmol/L (ref 22–32)
Calcium: 9.1 mg/dL (ref 8.9–10.3)
Chloride: 106 mmol/L (ref 98–111)
Creatinine, Ser: 1.45 mg/dL — ABNORMAL HIGH (ref 0.61–1.24)
GFR calc Af Amer: 53 mL/min — ABNORMAL LOW (ref >60)
GFR calc non Af Amer: 46 mL/min — ABNORMAL LOW (ref >60)
Glucose, Bld: 129 mg/dL — ABNORMAL HIGH (ref 70–99)
Potassium: 4.3 mmol/L (ref 3.5–5.1)
Sodium: 142 mmol/L (ref 135–145)

## 2020-04-29 ENCOUNTER — Encounter: Payer: Self-pay | Admitting: Podiatry

## 2020-04-29 ENCOUNTER — Ambulatory Visit: Payer: Medicare PPO | Admitting: Podiatry

## 2020-04-29 ENCOUNTER — Other Ambulatory Visit: Payer: Self-pay

## 2020-04-29 DIAGNOSIS — M79675 Pain in left toe(s): Secondary | ICD-10-CM | POA: Diagnosis not present

## 2020-04-29 DIAGNOSIS — B351 Tinea unguium: Secondary | ICD-10-CM | POA: Diagnosis not present

## 2020-04-29 DIAGNOSIS — E1142 Type 2 diabetes mellitus with diabetic polyneuropathy: Secondary | ICD-10-CM | POA: Diagnosis not present

## 2020-04-29 DIAGNOSIS — M79674 Pain in right toe(s): Secondary | ICD-10-CM | POA: Diagnosis not present

## 2020-04-29 NOTE — Progress Notes (Signed)
This patient returns to my office for at risk foot care.  This patient requires this care by a professional since this patient will be at risk due to having diabetres and neuropathy.  Patient presents to the office with her husband.  This patient is unable to cut nails himself since the patient cannot reach his nails.These nails are painful walking and wearing shoes.  This patient presents for at risk foot care today.  General Appearance  Alert, conversant and in no acute stress.  Vascular  Dorsalis pedis and posterior tibial  pulses are weakly  palpable  bilaterally.  Capillary return is within normal limits  bilaterally. Temperature is within normal limits  bilaterally.  Neurologic  Senn-Weinstein monofilament wire test absent  bilaterally. Muscle power within normal limits bilaterally.  Nails Thick disfigured discolored nails with subungual debris  from hallux to fifth toes bilaterally. No evidence of bacterial infection or drainage bilaterally.  Orthopedic  No limitations of motion  feet .  No crepitus or effusions noted.  No bony pathology or digital deformities noted.  Skin  normotropic skin with no porokeratosis noted bilaterally.  No signs of infections or ulcers noted.     Onychomycosis  Pain in right toes  Pain in left toes  Consent was obtained for treatment procedures.   Mechanical debridement of nails 1-5  bilaterally performed with a nail nipper.  Filed with dremel without incident.    Return office visit   3 months                   Told patient to return for periodic foot care and evaluation due to potential at risk complications.   Gardiner Barefoot DPM

## 2020-05-20 ENCOUNTER — Ambulatory Visit: Payer: Medicare PPO

## 2020-05-20 ENCOUNTER — Other Ambulatory Visit (INDEPENDENT_AMBULATORY_CARE_PROVIDER_SITE_OTHER): Payer: Medicare PPO

## 2020-05-20 ENCOUNTER — Other Ambulatory Visit: Payer: Self-pay | Admitting: Family Medicine

## 2020-05-20 ENCOUNTER — Other Ambulatory Visit: Payer: Self-pay

## 2020-05-20 DIAGNOSIS — D649 Anemia, unspecified: Secondary | ICD-10-CM | POA: Diagnosis not present

## 2020-05-20 DIAGNOSIS — E1122 Type 2 diabetes mellitus with diabetic chronic kidney disease: Secondary | ICD-10-CM

## 2020-05-20 DIAGNOSIS — N183 Chronic kidney disease, stage 3 unspecified: Secondary | ICD-10-CM

## 2020-05-20 DIAGNOSIS — N400 Enlarged prostate without lower urinary tract symptoms: Secondary | ICD-10-CM

## 2020-05-20 DIAGNOSIS — E782 Mixed hyperlipidemia: Secondary | ICD-10-CM | POA: Diagnosis not present

## 2020-05-20 DIAGNOSIS — E118 Type 2 diabetes mellitus with unspecified complications: Secondary | ICD-10-CM

## 2020-05-20 LAB — COMPREHENSIVE METABOLIC PANEL
ALT: 10 U/L (ref 0–53)
AST: 16 U/L (ref 0–37)
Albumin: 4.1 g/dL (ref 3.5–5.2)
Alkaline Phosphatase: 31 U/L — ABNORMAL LOW (ref 39–117)
BUN: 24 mg/dL — ABNORMAL HIGH (ref 6–23)
CO2: 27 mEq/L (ref 19–32)
Calcium: 9.4 mg/dL (ref 8.4–10.5)
Chloride: 104 mEq/L (ref 96–112)
Creatinine, Ser: 1.52 mg/dL — ABNORMAL HIGH (ref 0.40–1.50)
GFR: 44.59 mL/min — ABNORMAL LOW (ref >60.00)
Glucose, Bld: 121 mg/dL — ABNORMAL HIGH (ref 70–99)
Potassium: 4.8 mEq/L (ref 3.5–5.1)
Sodium: 140 mEq/L (ref 135–145)
Total Bilirubin: 0.6 mg/dL (ref 0.2–1.2)
Total Protein: 6.6 g/dL (ref 6.0–8.3)

## 2020-05-20 LAB — LDL CHOLESTEROL, DIRECT: Direct LDL: 69 mg/dL

## 2020-05-20 LAB — CBC WITH DIFFERENTIAL/PLATELET
Basophils Absolute: 0 10*3/uL (ref 0.0–0.1)
Basophils Relative: 0.6 % (ref 0.0–3.0)
Eosinophils Absolute: 0.1 10*3/uL (ref 0.0–0.7)
Eosinophils Relative: 1.8 % (ref 0.0–5.0)
HCT: 38.4 % — ABNORMAL LOW (ref 39.0–52.0)
Hemoglobin: 12.6 g/dL — ABNORMAL LOW (ref 13.0–17.0)
Lymphocytes Relative: 34.8 % (ref 12.0–46.0)
Lymphs Abs: 2.2 10*3/uL (ref 0.7–4.0)
MCHC: 32.9 g/dL (ref 30.0–36.0)
MCV: 90.7 fl (ref 78.0–100.0)
Monocytes Absolute: 0.5 10*3/uL (ref 0.1–1.0)
Monocytes Relative: 7.8 % (ref 3.0–12.0)
Neutro Abs: 3.5 10*3/uL (ref 1.4–7.7)
Neutrophils Relative %: 55 % (ref 43.0–77.0)
Platelets: 232 10*3/uL (ref 150.0–400.0)
RBC: 4.23 Mil/uL (ref 4.22–5.81)
RDW: 14.5 % (ref 11.5–15.5)
WBC: 6.4 10*3/uL (ref 4.0–10.5)

## 2020-05-20 LAB — IBC PANEL
Iron: 84 ug/dL (ref 42–165)
Saturation Ratios: 20.5 % (ref 20.0–50.0)
Transferrin: 292 mg/dL (ref 212.0–360.0)

## 2020-05-20 LAB — VITAMIN B12: Vitamin B-12: 165 pg/mL — ABNORMAL LOW (ref 211–911)

## 2020-05-20 LAB — LIPID PANEL
Cholesterol: 157 mg/dL (ref 0–200)
HDL: 25.7 mg/dL — ABNORMAL LOW (ref >39.00)
Total CHOL/HDL Ratio: 6
Triglycerides: 407 mg/dL — ABNORMAL HIGH (ref 0.0–149.0)

## 2020-05-20 LAB — PSA: PSA: 0.78 ng/mL (ref 0.10–4.00)

## 2020-05-20 LAB — VITAMIN D 25 HYDROXY (VIT D DEFICIENCY, FRACTURES): VITD: 23.85 ng/mL — ABNORMAL LOW (ref 30.00–100.00)

## 2020-05-20 LAB — HEMOGLOBIN A1C: Hgb A1c MFr Bld: 6.4 % (ref 4.6–6.5)

## 2020-05-20 LAB — FERRITIN: Ferritin: 38.5 ng/mL (ref 22.0–322.0)

## 2020-05-20 LAB — FOLATE: Folate: 8.1 ng/mL (ref >5.9)

## 2020-05-24 ENCOUNTER — Ambulatory Visit (INDEPENDENT_AMBULATORY_CARE_PROVIDER_SITE_OTHER): Payer: Medicare PPO | Admitting: Family Medicine

## 2020-05-24 ENCOUNTER — Encounter: Payer: Self-pay | Admitting: Family Medicine

## 2020-05-24 ENCOUNTER — Other Ambulatory Visit: Payer: Self-pay

## 2020-05-24 VITALS — BP 134/76 | HR 60 | Temp 97.7°F | Ht 70.0 in | Wt 270.3 lb

## 2020-05-24 DIAGNOSIS — IMO0001 Reserved for inherently not codable concepts without codable children: Secondary | ICD-10-CM

## 2020-05-24 DIAGNOSIS — Z7189 Other specified counseling: Secondary | ICD-10-CM

## 2020-05-24 DIAGNOSIS — E1169 Type 2 diabetes mellitus with other specified complication: Secondary | ICD-10-CM

## 2020-05-24 DIAGNOSIS — Z0000 Encounter for general adult medical examination without abnormal findings: Secondary | ICD-10-CM | POA: Diagnosis not present

## 2020-05-24 DIAGNOSIS — I5032 Chronic diastolic (congestive) heart failure: Secondary | ICD-10-CM

## 2020-05-24 DIAGNOSIS — Z66 Do not resuscitate: Secondary | ICD-10-CM

## 2020-05-24 DIAGNOSIS — E559 Vitamin D deficiency, unspecified: Secondary | ICD-10-CM | POA: Insufficient documentation

## 2020-05-24 DIAGNOSIS — K219 Gastro-esophageal reflux disease without esophagitis: Secondary | ICD-10-CM

## 2020-05-24 DIAGNOSIS — D649 Anemia, unspecified: Secondary | ICD-10-CM

## 2020-05-24 DIAGNOSIS — G6289 Other specified polyneuropathies: Secondary | ICD-10-CM

## 2020-05-24 DIAGNOSIS — I10 Essential (primary) hypertension: Secondary | ICD-10-CM

## 2020-05-24 DIAGNOSIS — E538 Deficiency of other specified B group vitamins: Secondary | ICD-10-CM | POA: Insufficient documentation

## 2020-05-24 DIAGNOSIS — N400 Enlarged prostate without lower urinary tract symptoms: Secondary | ICD-10-CM

## 2020-05-24 DIAGNOSIS — I2510 Atherosclerotic heart disease of native coronary artery without angina pectoris: Secondary | ICD-10-CM

## 2020-05-24 DIAGNOSIS — H353131 Nonexudative age-related macular degeneration, bilateral, early dry stage: Secondary | ICD-10-CM

## 2020-05-24 DIAGNOSIS — E1142 Type 2 diabetes mellitus with diabetic polyneuropathy: Secondary | ICD-10-CM

## 2020-05-24 DIAGNOSIS — E118 Type 2 diabetes mellitus with unspecified complications: Secondary | ICD-10-CM

## 2020-05-24 DIAGNOSIS — N183 Chronic kidney disease, stage 3 unspecified: Secondary | ICD-10-CM

## 2020-05-24 DIAGNOSIS — Z951 Presence of aortocoronary bypass graft: Secondary | ICD-10-CM

## 2020-05-24 MED ORDER — FISH OIL 1200 MG PO CAPS: 2.0000 | ORAL_CAPSULE | Freq: Every day | ORAL | Status: DC

## 2020-05-24 MED ORDER — VITAMIN D3 25 MCG (1000 UT) PO CAPS: 1.0000 | ORAL_CAPSULE | Freq: Every day | ORAL | Status: AC

## 2020-05-24 MED ORDER — CYANOCOBALAMIN 1000 MCG/ML IJ SOLN
1000.0000 ug | Freq: Once | INTRAMUSCULAR | Status: AC
  Administered 2020-05-24: 1000 ug via INTRAMUSCULAR

## 2020-05-24 MED ORDER — B-12 1000 MCG SL SUBL: 1.0000 | SUBLINGUAL_TABLET | Freq: Every day | SUBLINGUAL | Status: DC

## 2020-05-24 NOTE — Assessment & Plan Note (Signed)
rec start vit D oral 1000 IU daily.

## 2020-05-24 NOTE — Assessment & Plan Note (Signed)
Continue to encourage healthy diet choices.

## 2020-05-24 NOTE — Assessment & Plan Note (Signed)
Chronic, stable. Continue current regimen. 

## 2020-05-24 NOTE — Assessment & Plan Note (Signed)
B12 shot today, then start oral b12 1058mcg sublingual dissolvable tablet daily.

## 2020-05-24 NOTE — Assessment & Plan Note (Signed)
Stable period off medication.  

## 2020-05-24 NOTE — Assessment & Plan Note (Signed)
Again confirmed today with patient. Will need to verify if he has [facility] order form.

## 2020-05-24 NOTE — Assessment & Plan Note (Signed)
Seems to be improving each year.

## 2020-05-24 NOTE — Assessment & Plan Note (Signed)
Continues PPI MWF °

## 2020-05-24 NOTE — Assessment & Plan Note (Addendum)
Chronic, stable. Reviewed with patient.  Tolerating recent increased ACEI dose.

## 2020-05-24 NOTE — Assessment & Plan Note (Signed)
Now on AREDS vitamins.

## 2020-05-24 NOTE — Assessment & Plan Note (Signed)

## 2020-05-24 NOTE — Assessment & Plan Note (Signed)
Chronic, continues statin, fibrate, fish oil 2400mg  daily but with persistently high triglycerides. Consider increasing fish oil dose vs vascepa. Reviewed dietary recommendations to lower triglyceride levels. He is not very careful with diet.  The 10-year ASCVD risk score Mikey Bussing DC Brooke Bonito., et al., 2013) is: 60.1%   Values used to calculate the score:     Age: 79 years     Sex: Male     Is Non-Hispanic African American: No     Diabetic: Yes     Tobacco smoker: No     Systolic Blood Pressure: 282 mmHg     Is BP treated: Yes     HDL Cholesterol: 25.7 mg/dL     Total Cholesterol: 157 mg/dL

## 2020-05-24 NOTE — Patient Instructions (Addendum)
Increase water intake.  b12 shot today then start b12 1079mcg dissolvable tablets daily Start vitamin D3 1000 units daily.  Bring [place] copy of your advanced directives.  Return as needed or in 6 months for diabetes follow up visit.   Health Maintenance After Age 78 After age 33, you are at a higher risk for certain long-term diseases and infections as well as injuries from falls. Falls are a major cause of broken bones and head injuries in people who are older than age 90. Getting regular preventive care can help to keep you healthy and well. Preventive care includes getting regular testing and making lifestyle changes as recommended by your health care provider. Talk with your health care provider about:  Which screenings and tests you should have. A screening is a test that checks for a disease when you have no symptoms.  A diet and exercise plan that is right for you. What should I know about screenings and tests to prevent falls? Screening and testing are the best ways to find a health problem early. Early diagnosis and treatment give you the best chance of managing medical conditions that are common after age 75. Certain conditions and lifestyle choices may make you more likely to have a fall. Your health care provider may recommend:  Regular vision checks. Poor vision and conditions such as cataracts can make you more likely to have a fall. If you wear glasses, make sure to get your prescription updated if your vision changes.  Medicine review. Work with your health care provider to regularly review all of the medicines you are taking, including over-the-counter medicines. Ask your health care provider about any side effects that may make you more likely to have a fall. Tell your health care provider if any medicines that you take make you feel dizzy or sleepy.  Osteoporosis screening. Osteoporosis is a condition that causes the bones to get weaker. This can make the bones weak and cause them  to break more easily.  Blood pressure screening. Blood pressure changes and medicines to control blood pressure can make you feel dizzy.  Strength and balance checks. Your health care provider may recommend certain tests to check your strength and balance while standing, walking, or changing positions.  Foot health exam. Foot pain and numbness, as well as not wearing proper footwear, can make you more likely to have a fall.  Depression screening. You may be more likely to have a fall if you have a fear of falling, feel emotionally low, or feel unable to do activities that you used to do.  Alcohol use screening. Using too much alcohol can affect your balance and may make you more likely to have a fall. What actions can I take to lower my risk of falls? General instructions  Talk with your health care provider about your risks for falling. Tell your health care provider if: ? You fall. Be sure to tell your health care provider about all falls, even ones that seem minor. ? You feel dizzy, sleepy, or off-balance.  Take over-the-counter and prescription medicines only as told by your health care provider. These include any supplements.  Eat a healthy diet and maintain a healthy weight. A healthy diet includes low-fat dairy products, low-fat (lean) meats, and fiber from whole grains, beans, and lots of fruits and vegetables. Home safety  Remove any tripping hazards, such as rugs, cords, and clutter.  Install safety equipment such as grab bars in bathrooms and safety rails on stairs.  Keep rooms and walkways well-lit. Activity   Follow a regular exercise program to stay fit. This will help you maintain your balance. Ask your health care provider what types of exercise are appropriate for you.  If you need a cane or walker, use it as recommended by your health care provider.  Wear supportive shoes that have nonskid soles. Lifestyle  Do not drink alcohol if your health care provider  tells you not to drink.  If you drink alcohol, limit how much you have: ? 0-1 drink a day for women. ? 0-2 drinks a day for men.  Be aware of how much alcohol is in your drink. In the [place] one drink equals one typical bottle of beer (12 oz), one-half glass of wine (5 oz), or one shot of hard liquor (1 oz).  Do not use any products that contain nicotine or tobacco, such as cigarettes and e-cigarettes. If you need help quitting, ask your health care provider. Summary  Having a healthy lifestyle and getting preventive care can help to protect your health and wellness after age 89.  Screening and testing are the best way to find a health problem early and help you avoid having a fall. Early diagnosis and treatment give you the best chance for managing medical conditions that are more common for people who are older than age 73.  Falls are a major cause of broken bones and head injuries in people who are older than age 73. Take precautions to prevent a fall at home.  Work with your health care provider to learn what changes you can make to improve your health and wellness and to prevent falls. This information is not intended to replace advice given to you by your health care provider. Make sure you discuss any questions you have with your health care provider. Document Revised: 12/19/2018 Document Reviewed: 07/11/2017 Elsevier Patient Education  2020 [employer].

## 2020-05-24 NOTE — Assessment & Plan Note (Signed)
Chronic, stable on januvia and metformin.

## 2020-05-24 NOTE — Assessment & Plan Note (Signed)
Preventative protocols reviewed and updated unless pt declined. Discussed healthy diet and lifestyle.  

## 2020-05-24 NOTE — Assessment & Plan Note (Addendum)
Chronic, longstanding. ?DM related.

## 2020-05-24 NOTE — Assessment & Plan Note (Signed)
Present for at least the past 2 years. Previously declined audiology eval. Agrees today. Will refer to ENT.

## 2020-05-24 NOTE — Assessment & Plan Note (Signed)
Again asked to bring [place] a copy.

## 2020-05-24 NOTE — Progress Notes (Signed)
This visit was conducted in person.  BP 134/76 (BP Location: Left Arm, Patient Position: Sitting, Cuff Size: Large)   Pulse 60   Temp 97.7 F (36.5 C) (Temporal)   Ht 5' 10" (1.778 m)   Wt 270 lb 5 oz (122.6 kg)   SpO2 98%   BMI 38.79 kg/m    CC: AMW Subjective:    Patient ID: [name] Luna., male    DOB: Jan 15, 1941, 79 y.o.   MRN: [id number]  HPI: [name] Glogowski. is a 79 y.o. male presenting on 05/24/2020 for Medicare Wellness (Pt accompanied by wife, Barnetta Chapel- temp 98.0.)   Did not see health advisor this year.    Hearing Screening   125Hz 250Hz 500Hz 1000Hz 2000Hz 3000Hz 4000Hz 6000Hz 8000Hz  Right ear:   0 0 0  0    Left ear:   _0 Vision Screening Comments: Last eye exam, 07/2019.    Office Visit from 05/24/2020 in [place] at [facility] Total Score 0    failed R ear hearing screen - no wax in ear.  Fall Risk  05/24/2020 05/22/2019 03/08/2018 12/10/2017 11/06/2016  Falls in the past year? 0 1 No No No  Number falls in past yr: - 1 - - -  Injury with Fall? - 0 - - -  Risk for fall due to : - - - - -  Risk for fall due to: Comment - - - - -    S/p 3v CABG and coronary endarterectomy 01/2018 for severe symptomatic CAD. 11 lb weight loss since March.   Preventative: COLONOSCOPY 12/2013 3 polyps, diverticulosis, rec rpt 3 yrs Ardis Hughs).  COLONOSCOPY 06/2019 - 6 polyps (TA), diverticulosis, f/u left open ended Ardis Hughs) Prostate - yearly check- PSA normal. No nocturia.  Flu shot - yearly  Pneumovax 09/2011, prevnar 04/2014 Td 09/2011 COVID vaccine - [place] 09/2019 x2  Shingrix - declines  Advanced directive: would wantdaughter RNto beHCPOA.Saw lawyerand completed.Asked to bring [place] copy.Does not want prolonged life support. Pt wants to be DNR/DNI. Seat belt use discussed Sunscreen use discussed. No changing moles on skin.  Non smoker Alcohol - none  Dentist yearly Eye exam yearly - overdue Bowel - occasional constipation  managed with prunes  Bladder - some urge incontinence with leaking, also noted when standing up after sitting for a while - super beta prostate worsened pedal edema. Has not seen urology in several years ([place]). Doesn't wear depends or pads. Declines medication.   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 Interim medical history since our last visit reviewed. Allergies and medications reviewed and updated. Outpatient Medications Prior to Visit  Medication Sig Dispense Refill  . ACCU-CHEK AVIVA PLUS test strip USE AS DIRECTED TO CHECK BLOOD SUGARS UPTO FOUR TIMES DAILY. 100 each 3  . aspirin EC 81 MG tablet Take 81 mg by mouth daily.    . blood glucose meter kit and supplies Dispense based on patient and insurance preference. Use up to four times daily as directed. (FOR ICD-10 E10.9, E11.9). 1 each 0  . fenofibrate (TRICOR) 145 MG tablet Take 1 tablet (145 mg total) by mouth daily. 90 tablet 3  . JANUVIA 50 MG  tablet TAKE 1 TABLET BY MOUTH ONCE DAILY 90 tablet 3  . lisinopril (ZESTRIL) 20 MG tablet Take 1 tablet (20 mg total) by mouth daily. 90 tablet 1  . metFORMIN (GLUCOPHAGE) 500 MG tablet Take 1 tablet (500 mg total) by mouth daily with breakfast.    . metoprolol succinate (TOPROL-XL) 25 MG 24 hr tablet TAKE 1 TABLET BY MOUTH ONCE A DAY WITH OR IMMEDIATELY FOLLOWING A MEAL 90 tablet 0  . Multiple Vitamins-Minerals (MACULAR VITAMIN BENEFIT PO) Take 1 tablet by mouth daily.    . nitroGLYCERIN (NITROSTAT) 0.4 MG SL tablet Place 1 tablet (0.4 mg total) under the tongue every 5 (five) minutes as needed for chest pain. 25 tablet 4  . omeprazole (PRILOSEC) 40 MG capsule TAKE 1 CAPSULE BY MOUTH EVERY MONDAY,  WEDNESDAY, AND FRIDAY. 30 capsule 3  . rosuvastatin (CRESTOR) 5 MG tablet TAKE 1 TABLET BY MOUTH ONCE DAILY 30 tablet 4  . Omega-3 Fatty Acids (FISH OIL) 1000 MG CAPS Take 3 capsules (3,000 mg total) by mouth daily. 270 capsule 2   No facility-administered medications prior to visit.     Per HPI unless specifically indicated in ROS section below Review of Systems  Constitutional: Negative for activity change, appetite change, chills, fatigue, fever and unexpected weight change.  HENT: Negative for hearing loss.   Eyes: Negative for visual disturbance.  Respiratory: Negative for cough, chest tightness, shortness of breath and wheezing.   Cardiovascular: Negative for chest pain, palpitations and leg swelling.  Gastrointestinal: Negative for abdominal distention, abdominal pain, blood in stool, constipation, diarrhea, nausea and vomiting.  Genitourinary: Negative for difficulty urinating and hematuria.  Musculoskeletal: Negative for arthralgias, myalgias and neck pain.  Skin: Negative for rash.  Neurological: Negative for dizziness, seizures, syncope and headaches.  Hematological: Negative for adenopathy. Bruises/bleeds easily.  Psychiatric/Behavioral: Negative for dysphoric mood. The patient is not nervous/anxious.    Objective:  BP 134/76 (BP Location: Left Arm, Patient Position: Sitting, Cuff Size: Large)   Pulse 60   Temp 97.7 F (36.5 C) (Temporal)   Ht 5' 10" (1.778 m)   Wt 270 lb 5 oz (122.6 kg)   SpO2 98%   BMI 38.79 kg/m   Wt Readings from Last 3 Encounters:  05/24/20 270 lb 5 oz (122.6 kg)  03/26/20 274 lb (124.3 kg)  11/20/19 281 lb 1 oz (127.5 kg)      Physical Exam Vitals and nursing note reviewed.  Constitutional:      General: He is not in acute distress.    Appearance: Normal appearance. He is well-developed. He is obese. He is not ill-appearing.  HENT:     Head: Normocephalic and atraumatic.     Right Ear: Hearing, tympanic membrane, ear canal and external  ear normal.     Left Ear: Hearing, tympanic membrane, ear canal and external ear normal.  Eyes:     General: No scleral icterus.    Extraocular Movements: Extraocular movements intact.     Conjunctiva/sclera: Conjunctivae normal.     Pupils: Pupils are equal, round, and reactive to light.  Neck:     Thyroid: No thyroid mass.     Vascular: No carotid bruit.  Cardiovascular:     Rate and Rhythm: Normal rate and regular rhythm.     Pulses: Normal pulses.          Radial pulses are 2+ on the right side and 2+ on the left side.     Heart sounds: Normal heart sounds. No murmur  heard.   Pulmonary:     Effort: Pulmonary effort is normal. No respiratory distress.     Breath sounds: Normal breath sounds. No wheezing, rhonchi or rales.  Abdominal:     General: Abdomen is flat. Bowel sounds are normal. There is no distension.     Palpations: Abdomen is soft. There is no mass.     Tenderness: There is no abdominal tenderness. There is no guarding or rebound.     Hernia: No hernia is present.  Musculoskeletal:        General: Normal range of motion.     Cervical back: Normal range of motion and neck supple.     Right lower leg: No edema.     Left lower leg: No edema.  Lymphadenopathy:     Cervical: No cervical adenopathy.  Skin:    General: Skin is warm and dry.     Findings: No rash.  Neurological:     General: No focal deficit present.     Mental Status: He is alert and oriented to person, place, and time.     Comments:  CN grossly intact, station and gait intact Recall 3/3 Calculation 4/5 DLOW  Psychiatric:        Mood and Affect: Mood normal.        Behavior: Behavior normal.        Thought Content: Thought content normal.        Judgment: Judgment normal.       Results for orders placed or performed in visit on 05/20/20  VITAMIN D 25 Hydroxy (Vit-D Deficiency, Fractures)  Result Value Ref Range   VITD 23.85 (L) 30.00 - 100.00 ng/mL  Folate  Result Value Ref Range    Folate 8.1 >5.9 ng/mL  IBC panel  Result Value Ref Range   Iron 84 42 - 165 ug/dL   Transferrin 292.0 212.0 - 360.0 mg/dL   Saturation Ratios 20.5 20.0 - 50.0 %  Ferritin  Result Value Ref Range   Ferritin 38.5 22.0 - 322.0 ng/mL  Vitamin B12  Result Value Ref Range   Vitamin B-12 165 (L) 211 - 911 pg/mL  CBC with Differential/Platelet  Result Value Ref Range   WBC 6.4 4.0 - 10.5 K/uL   RBC 4.23 4.22 - 5.81 Mil/uL   Hemoglobin 12.6 (L) 13.0 - 17.0 g/dL   HCT 38.4 (L) 39 - 52 %   MCV 90.7 78.0 - 100.0 fl   MCHC 32.9 30.0 - 36.0 g/dL   RDW 14.5 11.5 - 15.5 %   Platelets 232.0 150 - 400 K/uL   Neutrophils Relative % 55.0 43 - 77 %   Lymphocytes Relative 34.8 12 - 46 %   Monocytes Relative 7.8 3 - 12 %   Eosinophils Relative 1.8 0 - 5 %   Basophils Relative 0.6 0 - 3 %   Neutro Abs 3.5 1.4 - 7.7 K/uL   Lymphs Abs 2.2 0.7 - 4.0 K/uL   Monocytes Absolute 0.5 0 - 1 K/uL   Eosinophils Absolute 0.1 0 - 0 K/uL   Basophils Absolute 0.0 0 - 0 K/uL  PSA  Result Value Ref Range   PSA 0.78 0.10 - 4.00 ng/mL  Hemoglobin A1c  Result Value Ref Range   Hgb A1c MFr Bld 6.4 4.6 - 6.5 %  Comprehensive metabolic panel  Result Value Ref Range   Sodium 140 135 - 145 mEq/L   Potassium 4.8 3.5 - 5.1 mEq/L   Chloride 104 96 - 112 mEq/L  CO2 27 19 - 32 mEq/L   Glucose, Bld 121 (H) 70 - 99 mg/dL   BUN 24 (H) 6 - 23 mg/dL   Creatinine, Ser 1.52 (H) 0.40 - 1.50 mg/dL   Total Bilirubin 0.6 0.2 - 1.2 mg/dL   Alkaline Phosphatase 31 (L) 39 - 117 U/L   AST 16 0 - 37 U/L   ALT 10 0 - 53 U/L   Total Protein 6.6 6.0 - 8.3 g/dL   Albumin 4.1 3.5 - 5.2 g/dL   GFR 44.59 (L) >60.00 mL/min   Calcium 9.4 8.4 - 10.5 mg/dL  Lipid panel  Result Value Ref Range   Cholesterol 157 0 - 200 mg/dL   Triglycerides (H) 0 - 149 mg/dL    407.0 Triglyceride is over 400; calculations on Lipids are invalid.   HDL 25.70 (L) >39.00 mg/dL   Total CHOL/HDL Ratio 6   LDL cholesterol, direct  Result Value Ref Range    Direct LDL 69.0 mg/dL   Assessment & Plan:  This visit occurred during the SARS-CoV-2 public health emergency.  Safety protocols were in place, including screening questions prior to the visit, additional usage of staff PPE, and extensive cleaning of exam room while observing appropriate contact time as indicated for disinfecting solutions.   Problem List Items Addressed This Visit    Vitamin D deficiency    rec start vit D oral 1000 IU daily.      Vitamin B12 deficiency    B12 shot today, then start oral b12 1015mg sublingual dissolvable tablet daily.      Severe obesity (BMI 35.0-39.9) with comorbidity ([place]    Continue to encourage healthy diet choices.       S/P CABG x 3   Peripheral neuropathy    Chronic, longstanding. ?DM related.       Medicare annual wellness visit, subsequent - Primary    I have personally reviewed the Medicare Annual Wellness questionnaire and have noted 1. The patient's medical and social history 2. Their use of alcohol, tobacco or illicit drugs 3. Their current medications and supplements 4. The patient's functional ability including ADL's, fall risks, home safety risks and hearing or visual impairment. Cognitive function has been assessed and addressed as indicated.  5. Diet and physical activity 6. Evidence for depression or mood disorders The patients weight, height, BMI have been recorded in the chart. I have made referrals, counseling and provided education to the patient based on review of the above and I have provided the pt with a written personalized care plan for preventive services. Provider list updated.. See scanned questionairre as needed for further documentation. Reviewed preventative protocols and updated unless pt declined.       Health maintenance examination    Preventative protocols reviewed and updated unless pt declined. Discussed healthy diet and lifestyle.       GERD (gastroesophageal reflux disease)    Continues PPI  MWF.       Essential hypertension    Chronic, stable. Continue current regimen.       Early dry stage nonexudative age-related macular degeneration of both eyes    Now on AREDS vitamins.      Dyslipidemia associated with type 2 diabetes mellitus (HCC)    Chronic, continues statin, fibrate, fish oil 24065mdaily but with persistently high triglycerides. Consider increasing fish oil dose vs vascepa. Reviewed dietary recommendations to lower triglyceride levels. He is not very careful with diet.  The 10-year ASCVD risk score (GMikey BussingC JrBrooke Bonito et  al., 2013) is: 60.1%   Values used to calculate the score:     Age: 24 years     Sex: Male     Is Non-Hispanic African American: No     Diabetic: Yes     Tobacco smoker: No     Systolic Blood Pressure: 322 mmHg     Is BP treated: Yes     HDL Cholesterol: 25.7 mg/dL     Total Cholesterol: 157 mg/dL       DNR (do not resuscitate)    Again confirmed today with patient. Will need to verify if he has [facility] order form.       Diabetic neuropathy ([place])   Coronary artery disease involving native coronary artery of native heart without angina pectoris   Controlled diabetes mellitus type 2 with complications (HCC)    Chronic, stable on januvia and metformin.       CKD stage 3 due to type 2 diabetes mellitus (HCC)    Chronic, stable. Reviewed with patient.  Tolerating recent increased ACEI dose.       Chronic heart failure with preserved ejection fraction (HCC)   Benign prostatic hyperplasia    Stable period off medication.       Asymmetrical hearing loss of right ear    Present for at least the past 2 years. Previously declined audiology eval. Agrees today. Will refer to ENT.       Relevant Orders   Ambulatory referral to ENT   Anemia    Seems to be improving each year.       Relevant Medications   Cyanocobalamin (B-12) 1000 MCG SUBL   Advanced care planning/counseling discussion    Again asked to bring [place] a copy.            Meds ordered this encounter  Medications  . Omega-3 Fatty Acids (FISH OIL) 1200 MG CAPS    Sig: Take 2 capsules (2,400 mg total) by mouth daily.  . cyanocobalamin ((VITAMIN B-12)) injection 1,000 mcg  . Cyanocobalamin (B-12) 1000 MCG SUBL    Sig: Place 1 tablet under the tongue daily.  . Cholecalciferol (VITAMIN D3) 25 MCG (1000 UT) CAPS    Sig: Take 1 capsule (1,000 Units total) by mouth daily.    Dispense:  30 capsule   Orders Placed This Encounter  Procedures  . Ambulatory referral to ENT    Referral Priority:   Routine    Referral Type:   Consultation    Referral Reason:   Specialty Services Required    Requested Specialty:   Otolaryngology    Number of Visits Requested:   1    Patient instructions: Increase water intake.  b12 shot today then start b12 1053mg dissolvable tablets daily Start vitamin D3 1000 units daily.  [place].  Return as needed or in 6 months for diabetes follow up visit.   Follow up plan: Return in about 6 months (around 11/21/2020) for follow up visit.  JRia Bush MD

## 2020-06-02 ENCOUNTER — Other Ambulatory Visit: Payer: Self-pay | Admitting: Family Medicine

## 2020-06-17 ENCOUNTER — Other Ambulatory Visit: Payer: Self-pay | Admitting: Internal Medicine

## 2020-07-12 ENCOUNTER — Ambulatory Visit: Payer: Medicare PPO | Admitting: Podiatry

## 2020-07-12 ENCOUNTER — Encounter: Payer: Self-pay | Admitting: Podiatry

## 2020-07-12 ENCOUNTER — Other Ambulatory Visit: Payer: Self-pay

## 2020-07-12 DIAGNOSIS — B351 Tinea unguium: Secondary | ICD-10-CM

## 2020-07-12 DIAGNOSIS — M79674 Pain in right toe(s): Secondary | ICD-10-CM

## 2020-07-12 DIAGNOSIS — M79675 Pain in left toe(s): Secondary | ICD-10-CM

## 2020-07-12 DIAGNOSIS — E1142 Type 2 diabetes mellitus with diabetic polyneuropathy: Secondary | ICD-10-CM | POA: Diagnosis not present

## 2020-07-12 NOTE — Progress Notes (Signed)
This patient returns to my office for at risk foot care.  This patient requires this care by a professional since this patient will be at risk due to having diabetres and neuropathy.  Patient presents to the office with her husband.  This patient is unable to cut nails himself since the patient cannot reach his nails.These nails are painful walking and wearing shoes.  This patient presents for at risk foot care today.  General Appearance  Alert, conversant and in no acute stress.  Vascular  Dorsalis pedis and posterior tibial  pulses are weakly  palpable  bilaterally.  Capillary return is within normal limits  bilaterally. Temperature is within normal limits  bilaterally.  Neurologic  Senn-Weinstein monofilament wire test absent  bilaterally. Muscle power within normal limits bilaterally.  Nails Thick disfigured discolored nails with subungual debris  from hallux to fifth toes bilaterally. No evidence of bacterial infection or drainage bilaterally.  Orthopedic  No limitations of motion  feet .  No crepitus or effusions noted.  No bony pathology or digital deformities noted.  Skin  normotropic skin with no porokeratosis noted bilaterally.  No signs of infections or ulcers noted.     Onychomycosis  Pain in right toes  Pain in left toes  Consent was obtained for treatment procedures.   Mechanical debridement of nails 1-5  bilaterally performed with a nail nipper.  Filed with dremel without incident.    Return office visit   10 weeks                 Told patient to return for periodic foot care and evaluation due to potential at risk complications.   Gardiner Barefoot DPM

## 2020-07-15 ENCOUNTER — Other Ambulatory Visit: Payer: Self-pay | Admitting: Internal Medicine

## 2020-07-15 ENCOUNTER — Other Ambulatory Visit: Payer: Self-pay | Admitting: Family Medicine

## 2020-07-15 NOTE — Telephone Encounter (Signed)
Rx request sent to pharmacy.  

## 2020-08-04 ENCOUNTER — Other Ambulatory Visit: Payer: Self-pay | Admitting: Family Medicine

## 2020-09-16 DIAGNOSIS — H353131 Nonexudative age-related macular degeneration, bilateral, early dry stage: Secondary | ICD-10-CM | POA: Diagnosis not present

## 2020-09-16 DIAGNOSIS — H0012 Chalazion right lower eyelid: Secondary | ICD-10-CM | POA: Diagnosis not present

## 2020-09-16 DIAGNOSIS — Z961 Presence of intraocular lens: Secondary | ICD-10-CM | POA: Diagnosis not present

## 2020-09-16 DIAGNOSIS — H0015 Chalazion left lower eyelid: Secondary | ICD-10-CM | POA: Diagnosis not present

## 2020-09-20 ENCOUNTER — Ambulatory Visit: Payer: Medicare PPO | Admitting: Podiatry

## 2020-09-20 ENCOUNTER — Other Ambulatory Visit: Payer: Self-pay

## 2020-09-20 ENCOUNTER — Encounter: Payer: Self-pay | Admitting: Podiatry

## 2020-09-20 DIAGNOSIS — M79674 Pain in right toe(s): Secondary | ICD-10-CM

## 2020-09-20 DIAGNOSIS — B351 Tinea unguium: Secondary | ICD-10-CM | POA: Diagnosis not present

## 2020-09-20 DIAGNOSIS — M79675 Pain in left toe(s): Secondary | ICD-10-CM | POA: Diagnosis not present

## 2020-09-20 DIAGNOSIS — E1142 Type 2 diabetes mellitus with diabetic polyneuropathy: Secondary | ICD-10-CM

## 2020-09-20 NOTE — Progress Notes (Signed)
This patient returns to my office for at risk foot care.  This patient requires this care by a professional since this patient will be at risk due to having diabetres and neuropathy.  Patient presents to the office with her husband.  This patient is unable to cut nails himself since the patient cannot reach his nails.These nails are painful walking and wearing shoes.  This patient presents for at risk foot care today.  General Appearance  Alert, conversant and in no acute stress.  Vascular  Dorsalis pedis and posterior tibial  pulses are weakly  palpable  bilaterally.  Capillary return is within normal limits  bilaterally. Cold feet  Bilaterally. Absent digital hair.  Neurologic  Senn-Weinstein monofilament wire test absent  bilaterally. Muscle power within normal limits bilaterally.  Nails Thick disfigured discolored nails with subungual debris  from hallux to fifth toes bilaterally. No evidence of bacterial infection or drainage bilaterally.  Orthopedic  No limitations of motion  feet .  No crepitus or effusions noted.  No bony pathology or digital deformities noted.  Skin  normotropic skin with no porokeratosis noted bilaterally.  No signs of infections or ulcers noted.     Onychomycosis  Pain in right toes  Pain in left toes  Consent was obtained for treatment procedures.   Mechanical debridement of nails 1-5  bilaterally performed with a nail nipper.  Filed with dremel without incident.    Return office visit   10 weeks                 Told patient to return for periodic foot care and evaluation due to potential at risk complications.   Gardiner Barefoot DPM

## 2020-09-21 ENCOUNTER — Other Ambulatory Visit: Payer: Self-pay | Admitting: *Deleted

## 2020-09-21 MED ORDER — METOPROLOL SUCCINATE ER 25 MG PO TB24: ORAL_TABLET | ORAL | 2 refills | Status: DC

## 2020-09-27 NOTE — Progress Notes (Signed)
Follow-up Outpatient Visit Date: 09/29/2020  Primary Care Provider: Ria Bush, MD [place] [address]  Chief Complaint: Follow-up coronary artery disease  HPI:  Mr. Stthomas is a 80 y.o. male with history of three-vessel CAD status post CABG (01/2018)complicated by postoperative atrial fibrillation, hypertension, hyperlipidemia, type 2 diabetes mellitus, and chronic kidney disease stage III, who presents for follow-up of CAD.  I last saw Mr. Cedillo in July, at which time he was doing relatively well, complaining mostly of chronic leg weakness.  Due to suboptimal blood pressure control, we elected to increase lisinopril to 20 mg daily.  Today, Mr. Barletta reports he has been doing fairly well.  His main complaints are frequent urination as well as continued difficulty walking on account of poor balance.  He denies chest pain, shortness of breath, palpitations, lightheadedness, and edema.  He does not have any leg pain.  He does not check his blood pressure regularly at home but is tolerating increased dose of lisinopril well.  He tries to minimize his sodium intake but endorses eating at [employer] frequently for breakfast.  He had sausage and eggs this morning.  --------------------------------------------------------------------------------------------------  Cardiovascular History & Procedures: Cardiovascular Problems:  Multivessel coronary artery disease  Risk Factors:  Hypertension, hyperlipidemia, type 2 diabetes mellitus, obesity, male gender, and age greater than 67  Cath/PCI:  LHC (12/21/17): LMCA normal. LAD with sequential 20% proximal and 70-80%mid LAD stenoses. Mid LAD lesion involves moderate-caliber D1 branch, with 70% ostial and 90% mid stenoses Ladona Mow 0,1,1). LargeLCx with small OM1 with 80% stenosis and moderate OM2 with subtotal chronic occlusion and bridging collaterals, and chronic total occlusion of the proximal RCA with  left-to-right collaterals filling the RPDA.  CV Surgery:  CABG (01/18/2018, Dr. Darcey Nora): LIMA to LAD, SVG to diagonal, and SVG to PL branch of RCA. LAD endarterectomy required for bypass grafting.  EP Procedures and Devices:  None  Non-Invasive Evaluation(s):  Exercise MPI (12/12/17): Intermediate risk study with moderate in size, moderate in severity basal and mid inferolateral reversible defect consistent with ischemia. LVEF mildly reduced at 51% with inferolateral hypokinesis. Poor exercise tolerance with frequent PVCs noted during stress.  TTE (11/23/17): Normal LV size with mild LVH. LVEF 50-55% with normal wall motion and grade 1 diastolic dysfunction. Mildly thickened and calcified mitral valve consistent with rheumatic disease. No evidence of stenosis. Trivial regurgitation. Normal RV size and function. Normal PA pressure.   Recent CV Pertinent Labs: Lab Results  Component Value Date   CHOL 157 05/20/2020   CHOL 153 07/11/2018   CHOL 178 02/01/2010   HDL 25.70 (L) 05/20/2020   HDL 34 (L) 07/11/2018   LDLCALC 60 04/09/2020   LDLCALC 79 07/11/2018   LDLDIRECT 69.0 05/20/2020   TRIG (H) 05/20/2020    407.0 Triglyceride is over 400; calculations on Lipids are invalid.   TRIG 812 02/01/2010   CHOLHDL 6 05/20/2020   INR 1.20 01/18/2018   K 4.8 05/20/2020   K 4.5 02/01/2010   MG 2.6 (H) 01/23/2018   BUN 24 (H) 05/20/2020   BUN 24 03/26/2020   CREATININE 1.52 (H) 05/20/2020   CREATININE 0.95 02/01/2010    Past medical and surgical history were reviewed and updated in EPIC.  Current Meds  Medication Sig  . ACCU-CHEK AVIVA PLUS test strip USE AS DIRECTED TO CHECK BLOOD SUGARS UPTO FOUR TIMES DAILY.  Marland Kitchen aspirin EC 81 MG tablet Take 81 mg by mouth daily.  . blood glucose meter kit and supplies  Dispense based on patient and insurance preference. Use up to four times daily as directed. (FOR ICD-10 E10.9, E11.9).  Marland Kitchen Cholecalciferol (VITAMIN D3) 25 MCG (1000 UT)  CAPS Take 1 capsule (1,000 Units total) by mouth daily.  . Cyanocobalamin (B-12) 1000 MCG SUBL Place 1 tablet under the tongue daily.  . fenofibrate (TRICOR) 145 MG tablet TAKE 1 TABLET BY MOUTH ONCE DAILY  . JANUVIA 50 MG tablet TAKE 1 TABLET BY MOUTH ONCE DAILY  . lisinopril (ZESTRIL) 20 MG tablet TAKE 1 TABLET BY MOUTH ONCE A DAY  . metFORMIN (GLUCOPHAGE) 500 MG tablet Take 1 tablet (500 mg total) by mouth daily with breakfast.  . metoprolol succinate (TOPROL-XL) 25 MG 24 hr tablet TAKE 1 TABLET BY MOUTH ONCE A DAY WITH OR IMMEDIATELY FOLLOWING A MEAL  . Multiple Vitamins-Minerals (MACULAR VITAMIN BENEFIT PO) Take 1 tablet by mouth daily.  . nitroGLYCERIN (NITROSTAT) 0.4 MG SL tablet Place 1 tablet (0.4 mg total) under the tongue every 5 (five) minutes as needed for chest pain.  . Omega-3 Fatty Acids (FISH OIL) 1200 MG CAPS Take 2 capsules (2,400 mg total) by mouth daily.  Marland Kitchen omeprazole (PRILOSEC) 40 MG capsule TAKE 1 CAPSULE BY MOUTH EVERY MONDAY, WEDNESDAY, AND FRIDAY.  . rosuvastatin (CRESTOR) 5 MG tablet TAKE 1 TABLET BY MOUTH ONCE DAILY    Allergies: Patient has no known allergies.  Social History   Tobacco Use  . Smoking status: Never Smoker  . Smokeless tobacco: Former Systems developer    Types: Secondary school teacher  . Vaping Use: Never used  Substance Use Topics  . Alcohol use: Yes    Comment: occ  . Drug use: No    Family History  Problem Relation Age of Onset  . Alzheimer's disease Mother   . Breast cancer Mother        breast  . Atrial fibrillation Mother   . CAD Mother   . Stroke Father 41  . Diabetes Father   . CAD Father   . Rectal cancer Maternal Grandfather        rectal  . Colon cancer Maternal Grandfather 60  . Colon polyps Sister   . Esophageal cancer Neg Hx   . Stomach cancer Neg Hx     Review of Systems: A 12-system review of systems was performed and was negative except as noted in the  HPI.  --------------------------------------------------------------------------------------------------  Physical Exam: BP (!) 170/80 (BP Location: Left Arm, Patient Position: Sitting, Cuff Size: Large)   Pulse 63   Ht 5' 11"  (1.803 m)   Wt 262 lb 8 oz (119.1 kg)   SpO2 98%   BMI 36.61 kg/m   General:  NAD.  Accompanied by his wife. Neck: No JVD or HJR. Lungs: Clear to auscultation bilaterally without wheezes or crackles. Heart: Regular rate and rhythm without murmurs, rubs, or gallops. Abdomen: Soft, nontender, nondistended. Extremities: No lower extremity edema.  EKG: Normal sinus rhythm without significant abnormality.  No significant change from prior tracing on 03/26/2020.  Lab Results  Component Value Date   WBC 6.4 05/20/2020   HGB 12.6 (L) 05/20/2020   HCT 38.4 (L) 05/20/2020   MCV 90.7 05/20/2020   PLT 232.0 05/20/2020    Lab Results  Component Value Date   NA 140 05/20/2020   K 4.8 05/20/2020   CL 104 05/20/2020   CO2 27 05/20/2020   BUN 24 (H) 05/20/2020   CREATININE 1.52 (H) 05/20/2020   GLUCOSE 121 (H) 05/20/2020   ALT 10  05/20/2020    Lab Results  Component Value Date   CHOL 157 05/20/2020   HDL 25.70 (L) 05/20/2020   LDLCALC 60 04/09/2020   LDLDIRECT 69.0 05/20/2020   TRIG (H) 05/20/2020    407.0 Triglyceride is over 400; calculations on Lipids are invalid.   CHOLHDL 6 05/20/2020    --------------------------------------------------------------------------------------------------  ASSESSMENT AND PLAN: Coronary artery disease: No angina reported.  Continue current medications for secondary prevention.  Uncontrolled hypertension: Blood pressure significantly elevated today despite escalation of lisinopril at our last visit.  Mr. Awbrey has been trying to limit his sodium intake, further review of his diet reveals some indiscretion.  I have reinforced the importance of sodium restriction.  We will check a BMP today and increase lisinopril to  40 mg daily.  We will need to recheck a BMP in 2 weeks to ensure stable renal function and electrolytes.  Chronic kidney disease stage III: Check BMP today and again in 2 weeks with escalation of lisinopril, as outlined above.  Mr. Down appears euvolemic on examination today.  Hyperlipidemia in the setting of diabetes mellitus: LDL well controlled on last check though significant hypertriglyceridemia was noted.  Importance of dietary modifications were reviewed.  We will continue current doses of fenofibrate and rosuvastatin, with repeat fasting lipid panel in about 2 weeks.  Morbid obesity: BMI greater than 35 with multiple comorbidities (diabetes mellitus, coronary artery disease, and hypertension).  Weight loss encouraged through diet and exercise.  Follow-up: Return to clinic in 4 months.  Nelva Bush, MD 09/29/2020 10:07 AM

## 2020-09-29 ENCOUNTER — Ambulatory Visit: Payer: Medicare PPO | Admitting: Internal Medicine

## 2020-09-29 ENCOUNTER — Other Ambulatory Visit: Payer: Self-pay

## 2020-09-29 ENCOUNTER — Encounter: Payer: Self-pay | Admitting: Internal Medicine

## 2020-09-29 VITALS — BP 170/80 | HR 63 | Ht 71.0 in | Wt 262.5 lb

## 2020-09-29 DIAGNOSIS — E1169 Type 2 diabetes mellitus with other specified complication: Secondary | ICD-10-CM | POA: Diagnosis not present

## 2020-09-29 DIAGNOSIS — I2510 Atherosclerotic heart disease of native coronary artery without angina pectoris: Secondary | ICD-10-CM | POA: Diagnosis not present

## 2020-09-29 DIAGNOSIS — I10 Essential (primary) hypertension: Secondary | ICD-10-CM | POA: Diagnosis not present

## 2020-09-29 DIAGNOSIS — Z79899 Other long term (current) drug therapy: Secondary | ICD-10-CM

## 2020-09-29 DIAGNOSIS — N1832 Chronic kidney disease, stage 3b: Secondary | ICD-10-CM | POA: Diagnosis not present

## 2020-09-29 DIAGNOSIS — E785 Hyperlipidemia, unspecified: Secondary | ICD-10-CM

## 2020-09-29 MED ORDER — LISINOPRIL 40 MG PO TABS: 40.0000 mg | ORAL_TABLET | Freq: Every day | ORAL | 1 refills | Status: DC

## 2020-09-29 NOTE — Patient Instructions (Signed)
Medication Instructions:  Your physician has recommended you make the following change in your medication:  1- INCREASE Lisinopril to 40 mg by mouth once a day.  *If you need a refill on your cardiac medications before your next appointment, please call your pharmacy*  Lab Work: [place].  2- Your physician recommends that you return for lab work in: 2 weeks - 10/13/20 for fasting lipid panel and CMP at the [facility] will need to be fasting. Please do not have anything to eat or drink after midnight the morning you have the lab work. You may only have water or black coffee with no cream or sugar. - Please go to the [facility]. You will check in at the front desk to the right as you walk into the atrium. Valet Parking is offered if needed. - No appointment needed. You may go any day between 7 am and 6 pm.  If you have labs (blood work) drawn today and your tests are completely normal, you will receive your results only by: Marland Kitchen MyChart Message (if you have MyChart) OR . A paper copy in the mail If you have any lab test that is abnormal or we need to change your treatment, we will call you to review the results.  Testing/Procedures: none  Follow-Up: At [facility], you and your health needs are our priority.  As part of our continuing mission to provide you with exceptional heart care, we have created designated Provider Care Teams.  These Care Teams include your primary Cardiologist (physician) and Advanced Practice Providers (APPs -  Physician Assistants and Nurse Practitioners) who all work together to provide you with the care you need, when you need it.  We recommend signing up for the patient portal called "MyChart".  Sign up information is provided on this After Visit Summary.  MyChart is used to connect with patients for Virtual Visits (Telemedicine).  Patients are able to view lab/test results, encounter notes, upcoming appointments, etc.  Non-urgent messages can  be sent to your provider as well.   To learn more about what you can do with MyChart, go to [email or web address].    Your next appointment:   4 month(s)  The format for your next appointment:   In Person  Provider:   You may see Nelva Bush, MD or one of the following Advanced Practice Providers on your designated Care Team:    Murray Hodgkins, NP  Christell Faith, PA-C  Marrianne Mood, PA-C  Cadence [place], [place]  Laurann Montana, NP

## 2020-09-30 LAB — BASIC METABOLIC PANEL
BUN/Creatinine Ratio: 17 (ref 10–24)
BUN: 24 mg/dL (ref 8–27)
CO2: 21 mmol/L (ref 20–29)
Calcium: 9.5 mg/dL (ref 8.6–10.2)
Chloride: 107 mmol/L — ABNORMAL HIGH (ref 96–106)
Creatinine, Ser: 1.4 mg/dL — ABNORMAL HIGH (ref 0.76–1.27)
GFR calc Af Amer: 55 mL/min/{1.73_m2} — ABNORMAL LOW (ref >59)
GFR calc non Af Amer: 48 mL/min/{1.73_m2} — ABNORMAL LOW (ref >59)
Glucose: 106 mg/dL — ABNORMAL HIGH (ref 65–99)
Potassium: 4.5 mmol/L (ref 3.5–5.2)
Sodium: 144 mmol/L (ref 134–144)

## 2020-10-13 ENCOUNTER — Other Ambulatory Visit
Admission: RE | Admit: 2020-10-13 | Discharge: 2020-10-13 | Disposition: A | Discharge status: Home / Self Care | Payer: Medicare PPO | Attending: Internal Medicine | Admitting: Internal Medicine

## 2020-10-13 DIAGNOSIS — I10 Essential (primary) hypertension: Secondary | ICD-10-CM | POA: Insufficient documentation

## 2020-10-13 DIAGNOSIS — Z79899 Other long term (current) drug therapy: Secondary | ICD-10-CM | POA: Diagnosis not present

## 2020-10-13 LAB — COMPREHENSIVE METABOLIC PANEL
ALT: 11 U/L (ref 0–44)
AST: 19 U/L (ref 15–41)
Albumin: 4 g/dL (ref 3.5–5.0)
Alkaline Phosphatase: 28 U/L — ABNORMAL LOW (ref 38–126)
Anion gap: 12 (ref 5–15)
BUN: 26 mg/dL — ABNORMAL HIGH (ref 8–23)
CO2: 23 mmol/L (ref 22–32)
Calcium: 9.4 mg/dL (ref 8.9–10.3)
Chloride: 104 mmol/L (ref 98–111)
Creatinine, Ser: 1.42 mg/dL — ABNORMAL HIGH (ref 0.61–1.24)
GFR, Estimated: 51 mL/min — ABNORMAL LOW (ref >60)
Glucose, Bld: 123 mg/dL — ABNORMAL HIGH (ref 70–99)
Potassium: 4.7 mmol/L (ref 3.5–5.1)
Sodium: 139 mmol/L (ref 135–145)
Total Bilirubin: 0.7 mg/dL (ref 0.3–1.2)
Total Protein: 7.2 g/dL (ref 6.5–8.1)

## 2020-10-13 LAB — LIPID PANEL
Cholesterol: 162 mg/dL (ref 0–200)
HDL: 30 mg/dL — ABNORMAL LOW (ref >40)
LDL Cholesterol: 80 mg/dL (ref 0–99)
Total CHOL/HDL Ratio: 5.4 RATIO
Triglycerides: 259 mg/dL — ABNORMAL HIGH (ref <150)
VLDL: 52 mg/dL — ABNORMAL HIGH (ref 0–40)

## 2020-10-14 ENCOUNTER — Telehealth: Payer: Self-pay | Admitting: *Deleted

## 2020-10-14 DIAGNOSIS — I2510 Atherosclerotic heart disease of native coronary artery without angina pectoris: Secondary | ICD-10-CM

## 2020-10-14 DIAGNOSIS — I10 Essential (primary) hypertension: Secondary | ICD-10-CM

## 2020-10-14 DIAGNOSIS — E785 Hyperlipidemia, unspecified: Secondary | ICD-10-CM

## 2020-10-14 MED ORDER — ROSUVASTATIN CALCIUM 10 MG PO TABS: 10.0000 mg | ORAL_TABLET | Freq: Every day | ORAL | 2 refills | Status: DC

## 2020-10-14 NOTE — Telephone Encounter (Signed)
-----   Message from Nelva Bush, MD sent at 10/13/2020  9:11 PM EST ----- Please let [name] Young know that his cholesterol is suboptimally controlled with elevated LDL (goal < 70) and triglycerides (goal < 150).  I recommend that we increase rosuvastatin to 10 mg daily with repeat fasting lipid panel and ALT in ~3 months.

## 2020-10-14 NOTE — Telephone Encounter (Signed)
The patient's wife has been notified of the result and verbalized understanding.  All questions (if any) were answered. Rx sent to pharmacy. She is aware that patient should have fasting lab work in 3 months at [facility] and will mark their calendar as a reminder.

## 2020-11-22 ENCOUNTER — Ambulatory Visit: Payer: Medicare PPO | Admitting: Family Medicine

## 2020-12-02 ENCOUNTER — Other Ambulatory Visit: Payer: Self-pay

## 2020-12-02 ENCOUNTER — Encounter: Payer: Self-pay | Admitting: Podiatry

## 2020-12-02 ENCOUNTER — Ambulatory Visit: Payer: Medicare PPO | Admitting: Podiatry

## 2020-12-02 DIAGNOSIS — E1142 Type 2 diabetes mellitus with diabetic polyneuropathy: Secondary | ICD-10-CM

## 2020-12-02 DIAGNOSIS — M79674 Pain in right toe(s): Secondary | ICD-10-CM | POA: Diagnosis not present

## 2020-12-02 DIAGNOSIS — M79675 Pain in left toe(s): Secondary | ICD-10-CM

## 2020-12-02 DIAGNOSIS — B351 Tinea unguium: Secondary | ICD-10-CM | POA: Diagnosis not present

## 2020-12-02 NOTE — Progress Notes (Signed)
This patient returns to my office for at risk foot care.  This patient requires this care by a professional since this patient will be at risk due to having diabetres and neuropathy.  Patient presents to the office with her husband.  This patient is unable to cut nails himself since the patient cannot reach his nails.These nails are painful walking and wearing shoes.  This patient presents for at risk foot care today.  General Appearance  Alert, conversant and in no acute stress.  Vascular  Dorsalis pedis and posterior tibial  pulses are weakly  palpable  bilaterally.  Capillary return is within normal limits  bilaterally. Cold feet  Bilaterally. Absent digital hair.  Neurologic  Senn-Weinstein monofilament wire test absent  bilaterally. Muscle power within normal limits bilaterally.  Nails Thick disfigured discolored nails with subungual debris  from hallux to fifth toes bilaterally. No evidence of bacterial infection or drainage bilaterally.  Orthopedic  No limitations of motion  feet .  No crepitus or effusions noted.  No bony pathology or digital deformities noted.  Skin  normotropic skin with no porokeratosis noted bilaterally.  No signs of infections or ulcers noted.     Onychomycosis  Pain in right toes  Pain in left toes  Consent was obtained for treatment procedures.   Mechanical debridement of nails 1-5  bilaterally performed with a nail nipper.  Filed with dremel without incident.    Return office visit   12 weeks                 Told patient to return for periodic foot care and evaluation due to potential at risk complications.   Avaree Gilberti DPM  

## 2021-01-11 ENCOUNTER — Other Ambulatory Visit: Payer: Self-pay | Admitting: Internal Medicine

## 2021-01-11 NOTE — Telephone Encounter (Signed)
Requested Prescriptions   Signed Prescriptions Disp Refills   lisinopril (ZESTRIL) 40 MG tablet 90 tablet 0    Sig: TAKE 1 TABLET BY MOUTH ONCE A DAY    Authorizing Provider: END, [place]    Ordering User: Britt Bottom

## 2021-01-28 ENCOUNTER — Other Ambulatory Visit: Payer: Self-pay

## 2021-01-28 ENCOUNTER — Ambulatory Visit: Payer: Medicare PPO | Admitting: Internal Medicine

## 2021-01-28 ENCOUNTER — Encounter: Payer: Self-pay | Admitting: Internal Medicine

## 2021-01-28 VITALS — BP 170/74 | HR 60 | Ht 70.0 in | Wt 262.0 lb

## 2021-01-28 DIAGNOSIS — I2510 Atherosclerotic heart disease of native coronary artery without angina pectoris: Secondary | ICD-10-CM

## 2021-01-28 DIAGNOSIS — I10 Essential (primary) hypertension: Secondary | ICD-10-CM | POA: Diagnosis not present

## 2021-01-28 DIAGNOSIS — E785 Hyperlipidemia, unspecified: Secondary | ICD-10-CM | POA: Diagnosis not present

## 2021-01-28 DIAGNOSIS — E1169 Type 2 diabetes mellitus with other specified complication: Secondary | ICD-10-CM | POA: Diagnosis not present

## 2021-01-28 NOTE — Patient Instructions (Signed)
Medication Instructions:  - Your physician recommends that you continue on your current medications as directed. Please refer to the Current Medication list given to you today.  *If you need a refill on your cardiac medications before your next appointment, please call your pharmacy*   Lab Work: - none ordered  If you have labs (blood work) drawn today and your tests are completely normal, you will receive your results only by: Marland Kitchen MyChart Message (if you have MyChart) OR . A paper copy in the mail If you have any lab test that is abnormal or we need to change your treatment, we will call you to review the results.   Testing/Procedures: - none ordered   Follow-Up: At [facility], you and your health needs are our priority.  As part of our continuing mission to provide you with exceptional heart care, we have created designated Provider Care Teams.  These Care Teams include your primary Cardiologist (physician) and Advanced Practice Providers (APPs -  Physician Assistants and Nurse Practitioners) who all work together to provide you with the care you need, when you need it.  We recommend signing up for the patient portal called "MyChart".  Sign up information is provided on this After Visit Summary.  MyChart is used to connect with patients for Virtual Visits (Telemedicine).  Patients are able to view lab/test results, encounter notes, upcoming appointments, etc.  Non-urgent messages can be sent to your provider as well.   To learn more about what you can do with MyChart, go to [email or web address].    Your next appointment:   6 month(s)  The format for your next appointment:   In Person  Provider:   You may see Nelva Bush, MD or one of the following Advanced Practice Providers on your designated Care Team:    Murray Hodgkins, NP  Christell Faith, PA-C  Marrianne Mood, PA-C  Cadence [place], [place]  Laurann Montana, NP    Other Instructions  1) Check your blood  pressure at home- call if this is consistently > 140/90   2) DASH Eating Plan DASH stands for Dietary Approaches to Stop Hypertension. The DASH eating plan is a healthy eating plan that has been shown to:  Reduce high blood pressure (hypertension).  Reduce your risk for type 2 diabetes, heart disease, and stroke.  Help with weight loss. What are tips for following this plan? Reading food labels  Check food labels for the amount of salt (sodium) per serving. Choose foods with less than 5 percent of the Daily Value of sodium. Generally, foods with less than 300 milligrams (mg) of sodium per serving fit into this eating plan.  To find whole grains, look for the word "whole" as the first word in the ingredient list. Shopping  Buy products labeled as "low-sodium" or "no salt added."  Buy fresh foods. Avoid canned foods and pre-made or frozen meals. Cooking  Avoid adding salt when cooking. Use salt-free seasonings or herbs instead of table salt or sea salt. Check with your health care provider or pharmacist before using salt substitutes.  Do not fry foods. Cook foods using healthy methods such as baking, boiling, grilling, roasting, and broiling instead.  Cook with heart-healthy oils, such as olive, canola, avocado, soybean, or sunflower oil. Meal planning  Eat a balanced diet that includes: ? 4 or more servings of fruits and 4 or more servings of vegetables each day. Try to fill one-half of your plate with fruits and vegetables. ? 6-8 servings of whole  grains each day. ? Less than 6 oz (170 g) of lean meat, poultry, or fish each day. A 3-oz (85-g) serving of meat is about the same size as a deck of cards. One egg equals 1 oz (28 g). ? 2-3 servings of low-fat dairy each day. One serving is 1 cup (237 mL). ? 1 serving of nuts, seeds, or beans 5 times each week. ? 2-3 servings of heart-healthy fats. Healthy fats called omega-3 fatty acids are found in foods such as walnuts, flaxseeds,  fortified milks, and eggs. These fats are also found in cold-water fish, such as sardines, salmon, and mackerel.  Limit how much you eat of: ? Canned or prepackaged foods. ? Food that is high in trans fat, such as some fried foods. ? Food that is high in saturated fat, such as fatty meat. ? Desserts and other sweets, sugary drinks, and other foods with added sugar. ? Full-fat dairy products.  Do not salt foods before eating.  Do not eat more than 4 egg yolks a week.  Try to eat at least 2 vegetarian meals a week.  Eat more home-cooked food and less restaurant, buffet, and fast food.   Lifestyle  When eating at a restaurant, ask that your food be prepared with less salt or no salt, if possible.  If you drink alcohol: ? Limit how much you use to:  0-1 drink a day for women who are not pregnant.  0-2 drinks a day for men. ? Be aware of how much alcohol is in your drink. In the [place] one drink equals one 12 oz bottle of beer (355 mL), one 5 oz glass of wine (148 mL), or one 1 oz glass of hard liquor (44 mL). General information  Avoid eating more than 2,300 mg of salt a day. If you have hypertension, you may need to reduce your sodium intake to 1,500 mg a day.  Work with your health care provider to maintain a healthy body weight or to lose weight. Ask what an ideal weight is for you.  Get at least 30 minutes of exercise that causes your heart to beat faster (aerobic exercise) most days of the week. Activities may include walking, swimming, or biking.  Work with your health care provider or dietitian to adjust your eating plan to your individual calorie needs. What foods should I eat? Fruits All fresh, dried, or frozen fruit. Canned fruit in natural juice (without added sugar). Vegetables Fresh or frozen vegetables (raw, steamed, roasted, or grilled). Low-sodium or reduced-sodium tomato and vegetable juice. Low-sodium or reduced-sodium tomato sauce and tomato paste. Low-sodium or  reduced-sodium canned vegetables. Grains Whole-grain or whole-wheat bread. Whole-grain or whole-wheat pasta. Brown rice. [place]. Bulgur. Whole-grain and low-sodium cereals. Pita bread. Low-fat, low-sodium crackers. Whole-wheat flour tortillas. Meats and other proteins Skinless chicken or [place]. Ground chicken or [place]. Pork with fat trimmed off. Fish and seafood. Egg whites. Dried beans, peas, or lentils. Unsalted nuts, nut butters, and seeds. Unsalted canned beans. Lean cuts of beef with fat trimmed off. Low-sodium, lean precooked or cured meat, such as sausages or meat loaves. Dairy Low-fat (1%) or fat-free (skim) milk. Reduced-fat, low-fat, or fat-free cheeses. Nonfat, low-sodium ricotta or cottage cheese. Low-fat or nonfat yogurt. Low-fat, low-sodium cheese. Fats and oils Soft margarine without trans fats. Vegetable oil. Reduced-fat, low-fat, or light mayonnaise and salad dressings (reduced-sodium). Canola, safflower, olive, avocado, soybean, and sunflower oils. Avocado. Seasonings and condiments Herbs. Spices. Seasoning mixes without salt. Other foods Unsalted popcorn  and pretzels. Fat-free sweets. The items listed above may not be a complete list of foods and beverages you can eat. Contact a dietitian for more information. What foods should I avoid? Fruits Canned fruit in a light or heavy syrup. Fried fruit. Fruit in cream or butter sauce. Vegetables Creamed or fried vegetables. Vegetables in a cheese sauce. Regular canned vegetables (not low-sodium or reduced-sodium). Regular canned tomato sauce and paste (not low-sodium or reduced-sodium). Regular tomato and vegetable juice (not low-sodium or reduced-sodium). Angie Fava. Olives. Grains Baked goods made with fat, such as croissants, muffins, or some breads. Dry pasta or rice meal packs. Meats and other proteins Fatty cuts of meat. Ribs. Fried meat. Berniece Salines. Bologna, salami, and other precooked or cured meats, such as sausages or  meat loaves. Fat from the back of a pig (fatback). Bratwurst. Salted nuts and seeds. Canned beans with added salt. Canned or smoked fish. Whole eggs or egg yolks. Chicken or [place] with skin. Dairy Whole or 2% milk, cream, and half-and-half. Whole or full-fat cream cheese. Whole-fat or sweetened yogurt. Full-fat cheese. Nondairy creamers. Whipped toppings. Processed cheese and cheese spreads. Fats and oils Butter. Stick margarine. Lard. Shortening. Ghee. Bacon fat. Tropical oils, such as coconut, palm kernel, or palm oil. Seasonings and condiments Onion salt, garlic salt, seasoned salt, table salt, and sea salt. Worcestershire sauce. Tartar sauce. Barbecue sauce. Teriyaki sauce. Soy sauce, including reduced-sodium. Steak sauce. Canned and packaged gravies. Fish sauce. Oyster sauce. Cocktail sauce. Store-bought horseradish. Ketchup. Mustard. Meat flavorings and tenderizers. Bouillon cubes. Hot sauces. Pre-made or packaged marinades. Pre-made or packaged taco seasonings. Relishes. Regular salad dressings. Other foods Salted popcorn and pretzels. The items listed above may not be a complete list of foods and beverages you should avoid. Contact a dietitian for more information. Where to find more information  National Heart, Lung, and Blood Institute: https://wilson-eaton.com/  American Heart Association: www.heart.org  Academy of Nutrition and Dietetics: www.eatright.[place]: www.kidney.org Summary  The DASH eating plan is a healthy eating plan that has been shown to reduce high blood pressure (hypertension). It may also reduce your risk for type 2 diabetes, heart disease, and stroke.  When on the DASH eating plan, aim to eat more fresh fruits and vegetables, whole grains, lean proteins, low-fat dairy, and heart-healthy fats.  With the DASH eating plan, you should limit salt (sodium) intake to 2,300 mg a day. If you have hypertension, you may need to reduce your sodium intake to  1,500 mg a day.  Work with your health care provider or dietitian to adjust your eating plan to your individual calorie needs. This information is not intended to replace advice given to you by your health care provider. Make sure you discuss any questions you have with your health care provider. Document Revised: 08/01/2019 Document Reviewed: 08/01/2019 Elsevier Patient Education  2021 [employer].

## 2021-01-28 NOTE — Progress Notes (Signed)
Follow-up Outpatient Visit Date: 01/28/2021  Primary Care Provider: Ria Bush, MD [place] [address]  Chief Complaint: Follow-up coronary artery disease  HPI:  [name] Young is a 80 y.o. male with history of  three-vessel CAD status post CABG (01/2018)complicated by postoperative atrial fibrillation, hypertension, hyperlipidemia, type 2 diabetes mellitus, and chronic kidney disease stage III, who presents for follow-up of coronary artery disease.  I last saw him in January, at which time he was doing fairly well, complaining primarily of balance problems and urinary frequency.  Due to suboptimal blood pressure control, we agreed to increase lisinopril to 40 mg daily.  Today, [name] Young reports that he is feeling relatively well.  His biggest complaint is urinary frequency.  He denies chest pain, shortness of breath, palpitations, lightheadedness, and edema.  He does not check his blood pressure regularly at home.  He reports being active around the house but does not exercise regularly.  He does not follow a low-sodium diet and thinks that his elevated blood pressure today may be related to having recently had pork barbecue.  --------------------------------------------------------------------------------------------------  Cardiovascular History & Procedures: Cardiovascular Problems:  Multivessel coronary artery disease  Risk Factors:  Hypertension, hyperlipidemia, type 2 diabetes mellitus, obesity, male gender, and age greater than 16  Cath/PCI:  LHC (12/21/17): LMCA normal. LAD with sequential 20% proximal and 70-80%mid LAD stenoses. Mid LAD lesion involves moderate-caliber D1 branch, with 70% ostial and 90% mid stenoses Ladona Mow 0,1,1). LargeLCx with small OM1 with 80% stenosis and moderate OM2 with subtotal chronic occlusion and bridging collaterals, and chronic total occlusion of the proximal RCA with left-to-right collaterals filling the  RPDA.  CV Surgery:  CABG (01/18/2018, Dr. Darcey Nora): LIMA to LAD, SVG to diagonal, and SVG to PL branch of RCA. LAD endarterectomy required for bypass grafting.  EP Procedures and Devices:  None  Non-Invasive Evaluation(s):  Exercise MPI (12/12/17): Intermediate risk study with moderate in size, moderate in severity basal and mid inferolateral reversible defect consistent with ischemia. LVEF mildly reduced at 51% with inferolateral hypokinesis. Poor exercise tolerance with frequent PVCs noted during stress.  TTE (11/23/17): Normal LV size with mild LVH. LVEF 50-55% with normal wall motion and grade 1 diastolic dysfunction. Mildly thickened and calcified mitral valve consistent with rheumatic disease. No evidence of stenosis. Trivial regurgitation. Normal RV size and function. Normal PA pressure.   Recent CV Pertinent Labs: Lab Results  Component Value Date   CHOL 162 10/13/2020   CHOL 153 07/11/2018   CHOL 178 02/01/2010   HDL 30 (L) 10/13/2020   HDL 34 (L) 07/11/2018   LDLCALC 80 10/13/2020   LDLCALC 79 07/11/2018   LDLDIRECT 69.0 05/20/2020   TRIG 259 (H) 10/13/2020   TRIG 812 02/01/2010   CHOLHDL 5.4 10/13/2020   INR 1.20 01/18/2018   K 4.7 10/13/2020   K 4.5 02/01/2010   MG 2.6 (H) 01/23/2018   BUN 26 (H) 10/13/2020   BUN 24 09/29/2020   CREATININE 1.42 (H) 10/13/2020   CREATININE 0.95 02/01/2010    Past medical and surgical history were reviewed and updated in EPIC.  Current Meds  Medication Sig  . ACCU-CHEK AVIVA PLUS test strip USE AS DIRECTED TO CHECK BLOOD SUGARS UPTO FOUR TIMES DAILY.  Marland Kitchen aspirin EC 81 MG tablet Take 81 mg by mouth daily.  . blood glucose meter kit and supplies Dispense based on patient and insurance preference. Use up to four times daily as directed. (FOR ICD-10 E10.9, E11.9).  Marland Kitchen  Cholecalciferol (VITAMIN D3) 25 MCG (1000 UT) CAPS Take 1 capsule (1,000 Units total) by mouth daily.  . Cyanocobalamin (B-12) 1000 MCG SUBL Place 1 tablet  under the tongue daily.  . fenofibrate (TRICOR) 145 MG tablet TAKE 1 TABLET BY MOUTH ONCE DAILY  . JANUVIA 50 MG tablet TAKE 1 TABLET BY MOUTH ONCE DAILY  . lisinopril (ZESTRIL) 40 MG tablet TAKE 1 TABLET BY MOUTH ONCE A DAY  . metFORMIN (GLUCOPHAGE) 500 MG tablet Take 1 tablet (500 mg total) by mouth daily with breakfast.  . metoprolol succinate (TOPROL-XL) 25 MG 24 hr tablet TAKE 1 TABLET BY MOUTH ONCE A DAY WITH OR IMMEDIATELY FOLLOWING A MEAL  . Multiple Vitamins-Minerals (MACULAR VITAMIN BENEFIT PO) Take 1 tablet by mouth daily.  . nitroGLYCERIN (NITROSTAT) 0.4 MG SL tablet Place 1 tablet (0.4 mg total) under the tongue every 5 (five) minutes as needed for chest pain.  . Omega-3 Fatty Acids (FISH OIL) 1200 MG CAPS Take 2 capsules (2,400 mg total) by mouth daily.  Marland Kitchen omeprazole (PRILOSEC) 40 MG capsule TAKE 1 CAPSULE BY MOUTH EVERY MONDAY, WEDNESDAY, AND FRIDAY.    Allergies: Patient has no known allergies.  Social History   Tobacco Use  . Smoking status: Never Smoker  . Smokeless tobacco: Former Systems developer    Types: Secondary school teacher  . Vaping Use: Never used  Substance Use Topics  . Alcohol use: Yes    Comment: occ  . Drug use: No    Family History  Problem Relation Age of Onset  . Alzheimer's disease Mother   . Breast cancer Mother        breast  . Atrial fibrillation Mother   . CAD Mother   . Stroke Father 26  . Diabetes Father   . CAD Father   . Rectal cancer Maternal Grandfather        rectal  . Colon cancer Maternal Grandfather 60  . Colon polyps Sister   . Esophageal cancer Neg Hx   . Stomach cancer Neg Hx     Review of Systems: A 12-system review of systems was performed and was negative except as noted in the HPI.  --------------------------------------------------------------------------------------------------  Physical Exam: BP (!) 170/74 (BP Location: Left Arm, Patient Position: Sitting, Cuff Size: Large)   Pulse 60   Ht 5' 10"  (1.778 m)   Wt 262 lb  (118.8 kg)   SpO2 98%   BMI 37.59 kg/m   Repeat BP 158/70  General:  NAD.  Accompanied by his wife. Neck: No JVD or HJR. Lungs: Clear to auscultation bilaterally without wheezes or crackles. Heart: Regular rate and rhythm without murmurs, rubs, or gallops. Abdomen: Soft, nontender, nondistended. Extremities: Trace pretibial edema.  EKG: Normal sinus rhythm without abnormality.  Lab Results  Component Value Date   WBC 6.4 05/20/2020   HGB 12.6 (L) 05/20/2020   HCT 38.4 (L) 05/20/2020   MCV 90.7 05/20/2020   PLT 232.0 05/20/2020    Lab Results  Component Value Date   NA 139 10/13/2020   K 4.7 10/13/2020   CL 104 10/13/2020   CO2 23 10/13/2020   BUN 26 (H) 10/13/2020   CREATININE 1.42 (H) 10/13/2020   GLUCOSE 123 (H) 10/13/2020   ALT 11 10/13/2020    Lab Results  Component Value Date   CHOL 162 10/13/2020   HDL 30 (L) 10/13/2020   LDLCALC 80 10/13/2020   LDLDIRECT 69.0 05/20/2020   TRIG 259 (H) 10/13/2020   CHOLHDL 5.4 10/13/2020    --------------------------------------------------------------------------------------------------  ASSESSMENT AND PLAN: Coronary artery disease without angina: [name] Young does not report any chest pain or dyspnea (dyspnea was his anginal equivalent prior to CABG).  Continue current medications for secondary prevention.  Uncontrolled hypertension: Blood pressure remains quite elevated today, only modestly improved on recheck and still not at goal (goal less than 130/80).  We discussed addition of another agent for blood pressure control, as he is currently on maximum dose of lisinopril and resting heart rate of 60 bpm precludes escalation of metoprolol.  [name] Young does not wish to add any medications.  I have again counseled him about the importance of sodium restriction and weight loss to help with blood pressure control, as well as the potential negative consequences of untreated hypertension.  He voices  understanding.  Hyperlipidemia associated with type 2 diabetes mellitus: Continue rosuvastatin 10 mg daily with borderline LDL on last check in 10/2020 but history of myalgias with aggressive statin therapy.  Lifestyle modifications were encouraged.  Morbid obesity: BMI remains greater than 35 with multiple comorbidities (diabetes mellitus, hypertension, and coronary artery disease).  Weight loss encouraged through diet and exercise.  Follow-up: Return to clinic in 6 months.  Nelva Bush, MD 01/28/2021 3:43 PM

## 2021-01-30 ENCOUNTER — Encounter: Payer: Self-pay | Admitting: Internal Medicine

## 2021-02-02 ENCOUNTER — Telehealth: Payer: Self-pay | Admitting: Family Medicine

## 2021-02-02 NOTE — Telephone Encounter (Signed)
E-scribed refill.  Plz schedule wellness, lab and cpe visits.  

## 2021-02-02 NOTE — Telephone Encounter (Signed)
Patient is scheduled EM 

## 2021-02-02 NOTE — Telephone Encounter (Signed)
Noted  

## 2021-03-07 ENCOUNTER — Ambulatory Visit: Payer: Medicare PPO | Admitting: Podiatry

## 2021-03-10 ENCOUNTER — Other Ambulatory Visit: Payer: Self-pay

## 2021-03-10 ENCOUNTER — Encounter: Payer: Self-pay | Admitting: Podiatry

## 2021-03-10 ENCOUNTER — Ambulatory Visit: Payer: Medicare PPO | Admitting: Podiatry

## 2021-03-10 DIAGNOSIS — B351 Tinea unguium: Secondary | ICD-10-CM | POA: Diagnosis not present

## 2021-03-10 DIAGNOSIS — M79674 Pain in right toe(s): Secondary | ICD-10-CM

## 2021-03-10 DIAGNOSIS — E1142 Type 2 diabetes mellitus with diabetic polyneuropathy: Secondary | ICD-10-CM | POA: Diagnosis not present

## 2021-03-10 DIAGNOSIS — M79675 Pain in left toe(s): Secondary | ICD-10-CM | POA: Diagnosis not present

## 2021-03-10 NOTE — Progress Notes (Signed)
This patient returns to my office for at risk foot care.  This patient requires this care by a professional since this patient will be at risk due to having diabetres and neuropathy.  Patient presents to the office with her husband.  This patient is unable to cut nails himself since the patient cannot reach his nails.These nails are painful walking and wearing shoes.  This patient presents for at risk foot care today.  General Appearance  Alert, conversant and in no acute stress.  Vascular  Dorsalis pedis and posterior tibial  pulses are weakly  palpable  bilaterally.  Capillary return is within normal limits  bilaterally. Cold feet  Bilaterally. Absent digital hair.  Neurologic  Senn-Weinstein monofilament wire test absent  bilaterally. Muscle power within normal limits bilaterally.  Nails Thick disfigured discolored nails with subungual debris  from hallux to fifth toes bilaterally. No evidence of bacterial infection or drainage bilaterally.  Orthopedic  No limitations of motion  feet .  No crepitus or effusions noted.  No bony pathology or digital deformities noted.  Skin  normotropic skin with no porokeratosis noted bilaterally.  No signs of infections or ulcers noted.     Onychomycosis  Pain in right toes  Pain in left toes  Consent was obtained for treatment procedures.   Mechanical debridement of nails 1-5  bilaterally performed with a nail nipper.  Filed with dremel without incident.    Return office visit   12 weeks                 Told patient to return for periodic foot care and evaluation due to potential at risk complications.   Aitana Burry DPM  

## 2021-04-22 ENCOUNTER — Other Ambulatory Visit: Payer: Self-pay | Admitting: Family Medicine

## 2021-05-06 ENCOUNTER — Other Ambulatory Visit: Payer: Self-pay | Admitting: Family Medicine

## 2021-05-20 DIAGNOSIS — J019 Acute sinusitis, unspecified: Secondary | ICD-10-CM | POA: Diagnosis not present

## 2021-05-20 DIAGNOSIS — J029 Acute pharyngitis, unspecified: Secondary | ICD-10-CM | POA: Diagnosis not present

## 2021-05-20 DIAGNOSIS — R051 Acute cough: Secondary | ICD-10-CM | POA: Diagnosis not present

## 2021-05-28 ENCOUNTER — Other Ambulatory Visit: Payer: Self-pay | Admitting: Family Medicine

## 2021-05-28 DIAGNOSIS — E785 Hyperlipidemia, unspecified: Secondary | ICD-10-CM

## 2021-05-28 DIAGNOSIS — E538 Deficiency of other specified B group vitamins: Secondary | ICD-10-CM

## 2021-05-28 DIAGNOSIS — Z1159 Encounter for screening for other viral diseases: Secondary | ICD-10-CM

## 2021-05-28 DIAGNOSIS — N183 Chronic kidney disease, stage 3 unspecified: Secondary | ICD-10-CM

## 2021-05-28 DIAGNOSIS — E559 Vitamin D deficiency, unspecified: Secondary | ICD-10-CM

## 2021-05-31 ENCOUNTER — Other Ambulatory Visit: Payer: Medicare PPO

## 2021-06-07 ENCOUNTER — Other Ambulatory Visit: Payer: Self-pay

## 2021-06-07 ENCOUNTER — Ambulatory Visit (INDEPENDENT_AMBULATORY_CARE_PROVIDER_SITE_OTHER): Payer: Medicare PPO | Admitting: Family Medicine

## 2021-06-07 ENCOUNTER — Encounter: Payer: Self-pay | Admitting: Family Medicine

## 2021-06-07 VITALS — BP 132/78 | HR 63 | Temp 98.1°F | Ht 70.25 in | Wt 260.2 lb

## 2021-06-07 DIAGNOSIS — E785 Hyperlipidemia, unspecified: Secondary | ICD-10-CM | POA: Diagnosis not present

## 2021-06-07 DIAGNOSIS — I5032 Chronic diastolic (congestive) heart failure: Secondary | ICD-10-CM

## 2021-06-07 DIAGNOSIS — D649 Anemia, unspecified: Secondary | ICD-10-CM

## 2021-06-07 DIAGNOSIS — I10 Essential (primary) hypertension: Secondary | ICD-10-CM

## 2021-06-07 DIAGNOSIS — G6289 Other specified polyneuropathies: Secondary | ICD-10-CM

## 2021-06-07 DIAGNOSIS — Z23 Encounter for immunization: Secondary | ICD-10-CM

## 2021-06-07 DIAGNOSIS — I4891 Unspecified atrial fibrillation: Secondary | ICD-10-CM

## 2021-06-07 DIAGNOSIS — K219 Gastro-esophageal reflux disease without esophagitis: Secondary | ICD-10-CM

## 2021-06-07 DIAGNOSIS — N400 Enlarged prostate without lower urinary tract symptoms: Secondary | ICD-10-CM

## 2021-06-07 DIAGNOSIS — E538 Deficiency of other specified B group vitamins: Secondary | ICD-10-CM | POA: Diagnosis not present

## 2021-06-07 DIAGNOSIS — Z1159 Encounter for screening for other viral diseases: Secondary | ICD-10-CM

## 2021-06-07 DIAGNOSIS — Z0000 Encounter for general adult medical examination without abnormal findings: Secondary | ICD-10-CM

## 2021-06-07 DIAGNOSIS — N183 Chronic kidney disease, stage 3 unspecified: Secondary | ICD-10-CM

## 2021-06-07 DIAGNOSIS — E1169 Type 2 diabetes mellitus with other specified complication: Secondary | ICD-10-CM | POA: Diagnosis not present

## 2021-06-07 DIAGNOSIS — E1122 Type 2 diabetes mellitus with diabetic chronic kidney disease: Secondary | ICD-10-CM

## 2021-06-07 DIAGNOSIS — H9191 Unspecified hearing loss, right ear: Secondary | ICD-10-CM

## 2021-06-07 DIAGNOSIS — E1142 Type 2 diabetes mellitus with diabetic polyneuropathy: Secondary | ICD-10-CM

## 2021-06-07 DIAGNOSIS — E559 Vitamin D deficiency, unspecified: Secondary | ICD-10-CM

## 2021-06-07 DIAGNOSIS — Z7189 Other specified counseling: Secondary | ICD-10-CM

## 2021-06-07 DIAGNOSIS — Z951 Presence of aortocoronary bypass graft: Secondary | ICD-10-CM

## 2021-06-07 DIAGNOSIS — E118 Type 2 diabetes mellitus with unspecified complications: Secondary | ICD-10-CM

## 2021-06-07 LAB — CBC WITH DIFFERENTIAL/PLATELET
Basophils Absolute: 0 10*3/uL (ref 0.0–0.1)
Basophils Relative: 0.4 % (ref 0.0–3.0)
Eosinophils Absolute: 0.1 10*3/uL (ref 0.0–0.7)
Eosinophils Relative: 1.9 % (ref 0.0–5.0)
HCT: 38.5 % — ABNORMAL LOW (ref 39.0–52.0)
Hemoglobin: 12.5 g/dL — ABNORMAL LOW (ref 13.0–17.0)
Lymphocytes Relative: 33.8 % (ref 12.0–46.0)
Lymphs Abs: 2.1 10*3/uL (ref 0.7–4.0)
MCHC: 32.5 g/dL (ref 30.0–36.0)
MCV: 92.1 fl (ref 78.0–100.0)
Monocytes Absolute: 0.4 10*3/uL (ref 0.1–1.0)
Monocytes Relative: 6.4 % (ref 3.0–12.0)
Neutro Abs: 3.5 10*3/uL (ref 1.4–7.7)
Neutrophils Relative %: 57.5 % (ref 43.0–77.0)
Platelets: 262 10*3/uL (ref 150.0–400.0)
RBC: 4.18 Mil/uL — ABNORMAL LOW (ref 4.22–5.81)
RDW: 15 % (ref 11.5–15.5)
WBC: 6.1 10*3/uL (ref 4.0–10.5)

## 2021-06-07 LAB — HEMOGLOBIN A1C: Hgb A1c MFr Bld: 6.8 % — ABNORMAL HIGH (ref 4.6–6.5)

## 2021-06-07 LAB — COMPREHENSIVE METABOLIC PANEL
ALT: 13 U/L (ref 0–53)
AST: 18 U/L (ref 0–37)
Albumin: 4.3 g/dL (ref 3.5–5.2)
Alkaline Phosphatase: 32 U/L — ABNORMAL LOW (ref 39–117)
BUN: 27 mg/dL — ABNORMAL HIGH (ref 6–23)
CO2: 26 mEq/L (ref 19–32)
Calcium: 9.7 mg/dL (ref 8.4–10.5)
Chloride: 106 mEq/L (ref 96–112)
Creatinine, Ser: 1.34 mg/dL (ref 0.40–1.50)
GFR: 50.58 mL/min — ABNORMAL LOW (ref >60.00)
Glucose, Bld: 116 mg/dL — ABNORMAL HIGH (ref 70–99)
Potassium: 4.8 mEq/L (ref 3.5–5.1)
Sodium: 141 mEq/L (ref 135–145)
Total Bilirubin: 0.9 mg/dL (ref 0.2–1.2)
Total Protein: 7 g/dL (ref 6.0–8.3)

## 2021-06-07 LAB — LIPID PANEL
Cholesterol: 163 mg/dL (ref 0–200)
HDL: 30.2 mg/dL — ABNORMAL LOW (ref >39.00)
NonHDL: 132.31
Total CHOL/HDL Ratio: 5
Triglycerides: 393 mg/dL — ABNORMAL HIGH (ref 0.0–149.0)
VLDL: 78.6 mg/dL — ABNORMAL HIGH (ref 0.0–40.0)

## 2021-06-07 LAB — LDL CHOLESTEROL, DIRECT: Direct LDL: 66 mg/dL

## 2021-06-07 LAB — VITAMIN B12: Vitamin B-12: 277 pg/mL (ref 211–911)

## 2021-06-07 LAB — VITAMIN D 25 HYDROXY (VIT D DEFICIENCY, FRACTURES): VITD: 41.6 ng/mL (ref 30.00–100.00)

## 2021-06-07 MED ORDER — FENOFIBRATE 145 MG PO TABS: 145.0000 mg | ORAL_TABLET | Freq: Every day | ORAL | 3 refills | Status: DC

## 2021-06-07 MED ORDER — OMEPRAZOLE 40 MG PO CPDR: 40.0000 mg | DELAYED_RELEASE_CAPSULE | ORAL | 3 refills | Status: DC

## 2021-06-07 MED ORDER — SITAGLIPTIN PHOSPHATE 50 MG PO TABS: 50.0000 mg | ORAL_TABLET | Freq: Every day | ORAL | 3 refills | Status: DC

## 2021-06-07 MED ORDER — METFORMIN HCL 500 MG PO TABS: 500.0000 mg | ORAL_TABLET | Freq: Every day | ORAL | 3 refills | Status: DC

## 2021-06-07 NOTE — Patient Instructions (Addendum)
Flu shot today  Labs today  Bring [place] copy of your living will.  Return as needed or in 6 months for follow up visit.  Schedule appointment with audiologist, let us know if you need a referral.   Health Maintenance After Age 79 After age 69, you are at a higher risk for certain long-term diseases and infections as well as injuries from falls. Falls are a major cause of broken bones and head injuries in people who are older than age 60. Getting regular preventive care can help to keep you healthy and well. Preventive care includes getting regular testing and making lifestyle changes as recommended by your health care provider. Talk with your health care provider about: Which screenings and tests you should have. A screening is a test that checks for a disease when you have no symptoms. A diet and exercise plan that is right for you. What should I know about screenings and tests to prevent falls? Screening and testing are the best ways to find a health problem early. Early diagnosis and treatment give you the best chance of managing medical conditions that are common after age 51. Certain conditions and lifestyle choices may make you more likely to have a fall. Your health care provider may recommend: Regular vision checks. Poor vision and conditions such as cataracts can make you more likely to have a fall. If you wear glasses, make sure to get your prescription updated if your vision changes. Medicine review. Work with your health care provider to regularly review all of the medicines you are taking, including over-the-counter medicines. Ask your health care provider about any side effects that may make you more likely to have a fall. Tell your health care provider if any medicines that you take make you feel dizzy or sleepy. Osteoporosis screening. Osteoporosis is a condition that causes the bones to get weaker. This can make the bones weak and cause them to break more easily. Blood pressure  screening. Blood pressure changes and medicines to control blood pressure can make you feel dizzy. Strength and balance checks. Your health care provider may recommend certain tests to check your strength and balance while standing, walking, or changing positions. Foot health exam. Foot pain and numbness, as well as not wearing proper footwear, can make you more likely to have a fall. Depression screening. You may be more likely to have a fall if you have a fear of falling, feel emotionally low, or feel unable to do activities that you used to do. Alcohol use screening. Using too much alcohol can affect your balance and may make you more likely to have a fall. What actions can I take to lower my risk of falls? General instructions Talk with your health care provider about your risks for falling. Tell your health care provider if: You fall. Be sure to tell your health care provider about all falls, even ones that seem minor. You feel dizzy, sleepy, or off-balance. Take over-the-counter and prescription medicines only as told by your health care provider. These include any supplements. Eat a healthy diet and maintain a healthy weight. A healthy diet includes low-fat dairy products, low-fat (lean) meats, and fiber from whole grains, beans, and lots of fruits and vegetables. Home safety Remove any tripping hazards, such as rugs, cords, and clutter. Install safety equipment such as grab bars in bathrooms and safety rails on stairs. Keep rooms and walkways well-lit. Activity  Follow a regular exercise program to stay fit. This will help you maintain  your balance. Ask your health care provider what types of exercise are appropriate for you. If you need a cane or walker, use it as recommended by your health care provider. Wear supportive shoes that have nonskid soles. Lifestyle Do not drink alcohol if your health care provider tells you not to drink. If you drink alcohol, limit how much you have: 0-1  drink a day for women. 0-2 drinks a day for men. Be aware of how much alcohol is in your drink. In the [place] one drink equals one typical bottle of beer (12 oz), one-half glass of wine (5 oz), or one shot of hard liquor (1 oz). Do not use any products that contain nicotine or tobacco, such as cigarettes and e-cigarettes. If you need help quitting, ask your health care provider. Summary Having a healthy lifestyle and getting preventive care can help to protect your health and wellness after age 79. Screening and testing are the best way to find a health problem early and help you avoid having a fall. Early diagnosis and treatment give you the best chance for managing medical conditions that are more common for people who are older than age 64. Falls are a major cause of broken bones and head injuries in people who are older than age 66. Take precautions to prevent a fall at home. Work with your health care provider to learn what changes you can make to improve your health and wellness and to prevent falls. This information is not intended to replace advice given to you by your health care provider. Make sure you discuss any questions you have with your health care provider. Document Revised: 11/05/2020 Document Reviewed: 08/13/2020 Elsevier Patient Education  2022 [employer].

## 2021-06-07 NOTE — Assessment & Plan Note (Signed)
Update labs.  

## 2021-06-07 NOTE — Assessment & Plan Note (Signed)
Isolated post-op afib. Sounds regular today. Continue aspirin 81mg  daily.

## 2021-06-07 NOTE — Assessment & Plan Note (Addendum)
Seems euvolemic.  Followed by cardiology.

## 2021-06-07 NOTE — Assessment & Plan Note (Signed)
Chronic on januvia and metformin 500mg  daily. Update A1c.

## 2021-06-07 NOTE — Assessment & Plan Note (Signed)
Chronic, stable. Continue current regimen. 

## 2021-06-07 NOTE — Assessment & Plan Note (Signed)
Preventative protocols reviewed and updated unless pt declined. Discussed healthy diet and lifestyle.  

## 2021-06-07 NOTE — Assessment & Plan Note (Signed)
Chronic, longstanding.  

## 2021-06-07 NOTE — Assessment & Plan Note (Deleted)
H/o this. Only on aspirin 81mg  daily. Sounds regular today.

## 2021-06-07 NOTE — Assessment & Plan Note (Signed)

## 2021-06-07 NOTE — Assessment & Plan Note (Signed)
Update vit D levels on D3 replacement 1000 IU daily.

## 2021-06-07 NOTE — Assessment & Plan Note (Signed)
Update B12 level on oral 1017mcg daily.

## 2021-06-07 NOTE — Assessment & Plan Note (Signed)
Managed with MWF PPI.

## 2021-06-07 NOTE — Assessment & Plan Note (Addendum)
Asymmetric. Longstanding for at least 3 years.  Encouraged audiology evaluation - they will let me know if they need referral.  Referred to ENT back in 2020, pt cancelled referral.

## 2021-06-07 NOTE — Assessment & Plan Note (Signed)
Stable period off medication, with minimal symptoms. Discussed aging out of prostate cancer screen

## 2021-06-07 NOTE — Assessment & Plan Note (Signed)
Advanced directive: would want daughter RN to be HCPOA. Saw lawyer and completed. Asked to bring [place] copy. Does not want prolonged life support. Pt wants to be DNR/DNI.

## 2021-06-07 NOTE — Assessment & Plan Note (Signed)
Encouraged healthy diet and lifestyle choices to affect sustainable weight loss.  ?

## 2021-06-07 NOTE — Progress Notes (Signed)
Patient ID: Torsten Weniger., male    DOB: Feb 02, 1941, 80 y.o.   MRN: [id number]  This visit was conducted in person.  BP 132/78   Pulse 63   Temp 98.1 F (36.7 C) (Temporal)   Ht 5' 10.25" (1.784 m)   Wt 260 lb 3 oz (118 kg)   SpO2 97%   BMI 37.07 kg/m    CC: AMW/CPE Subjective:   HPI: Cordie Buening. is a 80 y.o. male presenting on 06/07/2021 for Medicare Wellness (Pt accompanied by wife, Barnetta Chapel- temp 97.9.)   Did not see health advisor.  Hearing Screening   _0  _1  _2  _3   Right ear 0 0 0 0  Left ear _4 0  Comments: Per wife, pt's hearing is worsening.   Vision Screening - Comments:: Last eye exam, 09/2020.  [place] Office Visit from 06/07/2021 in [place] at [facility] Total Score 0     Failed R ear hearing - wife notices this as well. Planning to see audiologist.  Fall Risk  06/07/2021 05/24/2020 05/22/2019 03/08/2018 12/10/2017  Falls in the past year? 0 0 1 No No  Number falls in past yr: - - 1 - -  Injury with Fall? - - 0 - -  Risk for fall due to : - - - - -  Risk for fall due to: Travis illness 05/2021 seen at [facility] urgent care - symptoms fully resolved. Treated with cefdinir.   S/p 3v CABG and coronary endarterectomy 01/2018 for severe symptomatic CAD.   Preventative: COLONOSCOPY 12/2013 3 polyps, diverticulosis, rec rpt 3 yrs Ardis Hughs).  COLONOSCOPY 06/2019 - 6 polyps (TA), diverticulosis, f/u left open ended Ardis Hughs) - wants to age out  Prostate - yearly check - PSA normal. No nocturia. Discussed - will stop screening Lung cancer - not eligible  Flu shot - yearly  [place] 09/2019 x2, booster x2 06/2020, 01/2021  Pneumovax 09/2011, prevnar-13 04/2014 Td 09/2011 Shingrix - declines  Advanced directive: would want daughter RN to be HCPOA. Saw lawyer and completed. Asked to bring [place] copy. Does not want prolonged life support. Pt wants to be DNR/DNI.  Seat belt use discussed   Sunscreen use discussed. No changing moles on skin.  Non smoker Alcohol - none  Dentist yearly  Eye exam yearly - upcoming appt 09/2020 Bowel - no constipation  Bladder - urge incontinence with leaking if he waits too long. Doesn't wear depends or pads. Declines medication.    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 Interim medical history since our last visit reviewed. Allergies and medications reviewed and updated. Outpatient Medications Prior to Visit  Medication Sig Dispense Refill   ACCU-CHEK AVIVA PLUS test strip USE AS DIRECTED TO CHECK BLOOD SUGARS UPTO FOUR TIMES DAILY. 100 each 3   aspirin EC 81 MG tablet Take 81 mg by mouth daily.     blood glucose meter kit and supplies Dispense based on patient and insurance preference. Use up to four times daily as directed. (FOR ICD-10 E10.9, E11.9). 1 each 0   Cholecalciferol (VITAMIN D3) 25 MCG (1000 UT) CAPS Take 1 capsule (  1,000 Units total) by mouth daily. 30 capsule    Cyanocobalamin (B-12) 1000 MCG SUBL Place 1 tablet under the tongue daily.     lisinopril (ZESTRIL) 40 MG tablet TAKE 1 TABLET BY MOUTH ONCE A DAY 90 tablet 0   metoprolol succinate (TOPROL-XL) 25 MG 24 hr tablet TAKE 1 TABLET BY MOUTH ONCE A DAY WITH OR IMMEDIATELY FOLLOWING A MEAL 90 tablet 2   Multiple Vitamins-Minerals (MACULAR VITAMIN BENEFIT PO) Take 1 tablet by mouth daily.     nitroGLYCERIN (NITROSTAT) 0.4 MG SL tablet Place 1 tablet (0.4 mg total) under the tongue every 5 (five) minutes as needed for chest pain. 25 tablet 4   Omega-3 Fatty Acids (FISH OIL) 1200 MG CAPS Take 2 capsules (2,400 mg total) by mouth daily.     fenofibrate (TRICOR) 145 MG tablet TAKE 1 TABLET BY  MOUTH ONCE DAILY 90 tablet 0   JANUVIA 50 MG tablet TAKE 1 TABLET BY MOUTH ONCE DAILY 90 tablet 0   metFORMIN (GLUCOPHAGE) 500 MG tablet TAKE 1 TABLET BY MOUTH ONCE A DAY WITH BREAKFAST 90 tablet 1   omeprazole (PRILOSEC) 40 MG capsule TAKE 1 CAPSULE BY MOUTH EVERY MONDAY, WEDNESDAY, AND FRIDAY. 30 capsule 3   rosuvastatin (CRESTOR) 10 MG tablet Take 1 tablet (10 mg total) by mouth daily. 90 tablet 2   No facility-administered medications prior to visit.     Per HPI unless specifically indicated in ROS section below Review of Systems  Constitutional:  Negative for activity change, appetite change, chills, fatigue, fever and unexpected weight change.  HENT:  Negative for hearing loss.   Eyes:  Negative for visual disturbance.  Respiratory:  Positive for cough. Negative for chest tightness, shortness of breath and wheezing.   Cardiovascular:  Negative for chest pain, palpitations and leg swelling.  Gastrointestinal:  Negative for abdominal distention, abdominal pain, blood in stool, constipation, diarrhea, nausea and vomiting.  Genitourinary:  Negative for difficulty urinating and hematuria.  Musculoskeletal:  Negative for arthralgias, myalgias and neck pain.  Skin:  Negative for rash.  Neurological:  Negative for dizziness, seizures, syncope and headaches.  Hematological:  Negative for adenopathy. Bruises/bleeds easily.  Psychiatric/Behavioral:  Negative for dysphoric mood. The patient is not nervous/anxious.    Objective:  BP 132/78   Pulse 63   Temp 98.1 F (36.7 C) (Temporal)   Ht 5' 10.25" (1.784 m)   Wt 260 lb 3 oz (118 kg)   SpO2 97%   BMI 37.07 kg/m   Wt Readings from Last 3 Encounters:  06/07/21 260 lb 3 oz (118 kg)  01/28/21 262 lb (118.8 kg)  09/29/20 262 lb 8 oz (119.1 kg)      Physical Exam Vitals and nursing note reviewed.  Constitutional:      General: He is not in acute distress.    Appearance: Normal appearance. He is well-developed. He is not ill-appearing.   HENT:     Head: Normocephalic and atraumatic.     Right Ear: Hearing, tympanic membrane, ear canal and external ear normal.     Left Ear: Hearing, tympanic membrane, ear canal and external ear normal.  Eyes:     General: No scleral icterus.    Extraocular Movements: Extraocular movements intact.     Conjunctiva/sclera: Conjunctivae normal.     Pupils: Pupils are equal, round, and reactive to light.  Neck:     Thyroid: No thyroid mass or thyromegaly.     Vascular: No carotid bruit.  Cardiovascular:  Rate and Rhythm: Normal rate and regular rhythm.     Pulses: Normal pulses.          Radial pulses are 2+ on the right side and 2+ on the left side.     Heart sounds: Normal heart sounds. No murmur heard. Pulmonary:     Effort: Pulmonary effort is normal. No respiratory distress.     Breath sounds: Normal breath sounds. No wheezing, rhonchi or rales.  Abdominal:     General: Bowel sounds are normal. There is no distension.     Palpations: Abdomen is soft. There is no mass.     Tenderness: There is no abdominal tenderness. There is no guarding or rebound.     Hernia: No hernia is present.  Musculoskeletal:        General: Normal range of motion.     Cervical back: Normal range of motion and neck supple.     Right lower leg: No edema.     Left lower leg: No edema.  Lymphadenopathy:     Cervical: No cervical adenopathy.  Skin:    General: Skin is warm and dry.     Findings: No rash.  Neurological:     General: No focal deficit present.     Mental Status: He is alert and oriented to person, place, and time.     Comments:  Recall 3/3 Calculation 1/5 DOL  Psychiatric:        Mood and Affect: Mood normal.        Behavior: Behavior normal.        Thought Content: Thought content normal.        Judgment: Judgment normal.      Results for orders placed or performed during the hospital encounter of 10/13/20  Lipid panel  Result Value Ref Range   Cholesterol 162 0 - 200 mg/dL    Triglycerides 259 (H) <150 mg/dL   HDL 30 (L) >40 mg/dL   Total CHOL/HDL Ratio 5.4 RATIO   VLDL 52 (H) 0 - 40 mg/dL   LDL Cholesterol 80 0 - 99 mg/dL  Comprehensive metabolic panel  Result Value Ref Range   Sodium 139 135 - 145 mmol/L   Potassium 4.7 3.5 - 5.1 mmol/L   Chloride 104 98 - 111 mmol/L   CO2 23 22 - 32 mmol/L   Glucose, Bld 123 (H) 70 - 99 mg/dL   BUN 26 (H) 8 - 23 mg/dL   Creatinine, Ser 1.42 (H) 0.61 - 1.24 mg/dL   Calcium 9.4 8.9 - 10.3 mg/dL   Total Protein 7.2 6.5 - 8.1 g/dL   Albumin 4.0 3.5 - 5.0 g/dL   AST 19 15 - 41 U/L   ALT 11 0 - 44 U/L   Alkaline Phosphatase 28 (L) 38 - 126 U/L   Total Bilirubin 0.7 0.3 - 1.2 mg/dL   GFR, Estimated 51 (L) >60 mL/min   Anion gap 12 5 - 15   Lab Results  Component Value Date   HGBA1C 6.4 05/20/2020    Lab Results  Component Value Date   PSA 0.78 05/20/2020   PSA 0.69 05/15/2019   PSA 0.83 11/05/2017   Assessment & Plan:  This visit occurred during the SARS-CoV-2 public health emergency.  Safety protocols were in place, including screening questions prior to the visit, additional usage of staff PPE, and extensive cleaning of exam room while observing appropriate contact time as indicated for disinfecting solutions.   Problem List Items Addressed This Visit  Medicare annual wellness visit, subsequent - Primary (Chronic)    I have personally reviewed the Medicare Annual Wellness questionnaire and have noted 1. The patient's medical and social history 2. Their use of alcohol, tobacco or illicit drugs 3. Their current medications and supplements 4. The patient's functional ability including ADL's, fall risks, home safety risks and hearing or visual impairment. Cognitive function has been assessed and addressed as indicated.  5. Diet and physical activity 6. Evidence for depression or mood disorders The patients weight, height, BMI have been recorded in the chart. I have made referrals, counseling and provided  education to the patient based on review of the above and I have provided the pt with a written personalized care plan for preventive services. Provider list updated.. See scanned questionairre as needed for further documentation. Reviewed preventative protocols and updated unless pt declined.       Advanced care planning/counseling discussion (Chronic)    Advanced directive: would want daughter RN to be HCPOA. Saw lawyer and completed. Asked to bring [place] copy. Does not want prolonged life support. Pt wants to be DNR/DNI.       Health maintenance examination (Chronic)    Preventative protocols reviewed and updated unless pt declined. Discussed healthy diet and lifestyle.       Controlled diabetes mellitus type 2 with complications (HCC)    Chronic on januvia and metformin 539m daily. Update A1c.       Relevant Medications   metFORMIN (GLUCOPHAGE) 500 MG tablet   sitaGLIPtin (JANUVIA) 50 MG tablet   Essential hypertension    Chronic, stable. Continue current regimen.       Relevant Medications   fenofibrate (TRICOR) 145 MG tablet   Hyperlipidemia associated with type 2 diabetes mellitus (HCC)    Chronic, continues crestor (through cardiology), fenofibrate, and fish oil 24044m Prior triglycerides elevated - consider vascepa.  The 10-year ASCVD risk score (Arnett DK, et al., 2019) is: 59.9%   Values used to calculate the score:     Age: [age]ears     Sex: Male     Is Non-Hispanic African American: No     Diabetic: Yes     Tobacco smoker: No     Systolic Blood Pressure: 13628mHg     Is BP treated: Yes     HDL Cholesterol: 30 mg/dL     Total Cholesterol: 162 mg/dL       Relevant Medications   metFORMIN (GLUCOPHAGE) 500 MG tablet   sitaGLIPtin (JANUVIA) 50 MG tablet   fenofibrate (TRICOR) 145 MG tablet   GERD (gastroesophageal reflux disease)    Managed with MWF PPI.       Relevant Medications   omeprazole (PRILOSEC) 40 MG capsule (Start on 06/08/2021)   CKD stage 3 due  to type 2 diabetes mellitus (HCLas [place]   Update labs.       Relevant Medications   metFORMIN (GLUCOPHAGE) 500 MG tablet   sitaGLIPtin (JANUVIA) 50 MG tablet   Benign prostatic hyperplasia    Stable period off medication, with minimal symptoms. Discussed aging out of prostate cancer screen      Severe obesity (BMI 35.0-39.9) with comorbidity ([place]   Encouraged healthy diet and lifestyle choices to affect sustainable weight loss.       Relevant Medications   metFORMIN (GLUCOPHAGE) 500 MG tablet   sitaGLIPtin (JANUVIA) 50 MG tablet   Peripheral neuropathy    Chronic, longstanding.       S/P CABG x 3  Anemia   Atrial fibrillation (HCC)    Isolated post-op afib. Sounds regular today. Continue aspirin 33m daily.       Relevant Medications   fenofibrate (TRICOR) 145 MG tablet   Chronic heart failure with preserved ejection fraction (HCC)    Seems euvolemic.  Followed by cardiology.       Relevant Medications   fenofibrate (TRICOR) 145 MG tablet   Hearing loss, right    Asymmetric. Longstanding for at least 3 years.  Encouraged audiology evaluation - they will let me know if they need referral.  Referred to ENT back in 2020, pt cancelled referral.       Diabetic neuropathy ([place]   Relevant Medications   metFORMIN (GLUCOPHAGE) 500 MG tablet   sitaGLIPtin (JANUVIA) 50 MG tablet   Vitamin B12 deficiency    Update B12 level on oral 10027m daily.       Vitamin D deficiency    Update vit D levels on D3 replacement 1000 IU daily.       Other Visit Diagnoses     Need for influenza vaccination       Relevant Orders   Flu Vaccine QUAD High Dose(Fluad) (Completed)   Need for hepatitis C screening test            Meds ordered this encounter  Medications   omeprazole (PRILOSEC) 40 MG capsule    Sig: Take 1 capsule (40 mg total) by mouth every Monday, Wednesday, and Friday.    Dispense:  40 capsule    Refill:  3   metFORMIN (GLUCOPHAGE) 500 MG tablet    Sig: Take 1  tablet (500 mg total) by mouth daily with breakfast.    Dispense:  90 tablet    Refill:  3   sitaGLIPtin (JANUVIA) 50 MG tablet    Sig: Take 1 tablet (50 mg total) by mouth daily.    Dispense:  90 tablet    Refill:  3   fenofibrate (TRICOR) 145 MG tablet    Sig: Take 1 tablet (145 mg total) by mouth daily.    Dispense:  90 tablet    Refill:  3   Orders Placed This Encounter  Procedures   Flu Vaccine QUAD High Dose(Fluad)    Patient instructions: Flu shot today  Labs today  Bring us[place]opy of your living will.  Return as needed or in 6 months for follow up visit.  Schedule appointment with audiologist, let usKoreanow if you need a referral.   Follow up plan: Return in about 6 months (around 12/05/2021) for follow up visit.  JaRia BushMD

## 2021-06-07 NOTE — Assessment & Plan Note (Signed)
Chronic, continues crestor (through cardiology), fenofibrate, and fish oil 2400mg . Prior triglycerides elevated - consider vascepa.  The 10-year ASCVD risk score (Arnett DK, et al., 2019) is: 59.9%   Values used to calculate the score:     Age: 80 years     Sex: Male     Is Non-Hispanic African American: No     Diabetic: Yes     Tobacco smoker: No     Systolic Blood Pressure: 213 mmHg     Is BP treated: Yes     HDL Cholesterol: 30 mg/dL     Total Cholesterol: 162 mg/dL

## 2021-06-08 LAB — HEPATITIS C ANTIBODY
Hepatitis C Ab: NONREACTIVE
SIGNAL TO CUT-OFF: 0.01 (ref <1.00)

## 2021-06-16 ENCOUNTER — Other Ambulatory Visit: Payer: Self-pay

## 2021-06-16 ENCOUNTER — Encounter: Payer: Self-pay | Admitting: Podiatry

## 2021-06-16 ENCOUNTER — Ambulatory Visit: Payer: Medicare PPO | Admitting: Podiatry

## 2021-06-16 DIAGNOSIS — E1142 Type 2 diabetes mellitus with diabetic polyneuropathy: Secondary | ICD-10-CM | POA: Diagnosis not present

## 2021-06-16 DIAGNOSIS — M79674 Pain in right toe(s): Secondary | ICD-10-CM | POA: Diagnosis not present

## 2021-06-16 DIAGNOSIS — B351 Tinea unguium: Secondary | ICD-10-CM | POA: Diagnosis not present

## 2021-06-16 DIAGNOSIS — M79675 Pain in left toe(s): Secondary | ICD-10-CM | POA: Diagnosis not present

## 2021-06-16 NOTE — Progress Notes (Signed)
This patient returns to my office for at risk foot care.  This patient requires this care by a professional since this patient will be at risk due to having diabetres and neuropathy.  Patient presents to the office with her husband.  This patient is unable to cut nails himself since the patient cannot reach his nails.These nails are painful walking and wearing shoes.  This patient presents for at risk foot care today.  General Appearance  Alert, conversant and in no acute stress.  Vascular  Dorsalis pedis and posterior tibial  pulses are weakly  palpable  bilaterally.  Capillary return is within normal limits  bilaterally. Cold feet  Bilaterally. Absent digital hair.  Neurologic  Senn-Weinstein monofilament wire test absent  bilaterally. Muscle power within normal limits bilaterally.  Nails Thick disfigured discolored nails with subungual debris  from hallux to fifth toes bilaterally. No evidence of bacterial infection or drainage bilaterally.  Orthopedic  No limitations of motion  feet .  No crepitus or effusions noted.  No bony pathology or digital deformities noted.  Skin  normotropic skin with no porokeratosis noted bilaterally.  No signs of infections or ulcers noted.     Onychomycosis  Pain in right toes  Pain in left toes  Consent was obtained for treatment procedures.   Mechanical debridement of nails 1-5  bilaterally performed with a nail nipper.  Filed with dremel without incident.    Return office visit   12 weeks                 Told patient to return for periodic foot care and evaluation due to potential at risk complications.   Kody Brandl DPM  

## 2021-07-13 ENCOUNTER — Other Ambulatory Visit: Payer: Self-pay | Admitting: Internal Medicine

## 2021-07-18 ENCOUNTER — Other Ambulatory Visit: Payer: Self-pay | Admitting: Internal Medicine

## 2021-09-16 ENCOUNTER — Ambulatory Visit: Payer: Medicare PPO | Admitting: Internal Medicine

## 2021-09-22 ENCOUNTER — Ambulatory Visit (INDEPENDENT_AMBULATORY_CARE_PROVIDER_SITE_OTHER): Payer: Medicare PPO | Admitting: Podiatry

## 2021-09-22 ENCOUNTER — Other Ambulatory Visit: Payer: Self-pay

## 2021-09-22 ENCOUNTER — Encounter: Payer: Self-pay | Admitting: Podiatry

## 2021-09-22 DIAGNOSIS — M79675 Pain in left toe(s): Secondary | ICD-10-CM

## 2021-09-22 DIAGNOSIS — E1142 Type 2 diabetes mellitus with diabetic polyneuropathy: Secondary | ICD-10-CM

## 2021-09-22 DIAGNOSIS — N183 Chronic kidney disease, stage 3 unspecified: Secondary | ICD-10-CM | POA: Diagnosis not present

## 2021-09-22 DIAGNOSIS — M79674 Pain in right toe(s): Secondary | ICD-10-CM | POA: Diagnosis not present

## 2021-09-22 DIAGNOSIS — E1122 Type 2 diabetes mellitus with diabetic chronic kidney disease: Secondary | ICD-10-CM | POA: Diagnosis not present

## 2021-09-22 DIAGNOSIS — B351 Tinea unguium: Secondary | ICD-10-CM | POA: Diagnosis not present

## 2021-09-22 NOTE — Progress Notes (Signed)
This patient returns to my office for at risk foot care.  This patient requires this care by a professional since this patient will be at risk due to having diabetres and neuropathy.  Patient presents to the office with her husband.  This patient is unable to cut nails himself since the patient cannot reach his nails.These nails are painful walking and wearing shoes.  This patient presents for at risk foot care today.  General Appearance  Alert, conversant and in no acute stress.  Vascular  Dorsalis pedis and posterior tibial  pulses are weakly  palpable  bilaterally.  Capillary return is within normal limits  bilaterally. Cold feet  Bilaterally. Absent digital hair.  Neurologic  Senn-Weinstein monofilament wire test absent  bilaterally. Muscle power within normal limits bilaterally.  Nails Thick disfigured discolored nails with subungual debris  from hallux to fifth toes bilaterally. No evidence of bacterial infection or drainage bilaterally.  Orthopedic  No limitations of motion  feet .  No crepitus or effusions noted.  No bony pathology or digital deformities noted.  Skin  normotropic skin with no porokeratosis noted bilaterally.  No signs of infections or ulcers noted.     Onychomycosis  Pain in right toes  Pain in left toes  Consent was obtained for treatment procedures.   Mechanical debridement of nails 1-5  bilaterally performed with a nail nipper.  Filed with dremel without incident.    Return office visit   12 weeks                 Told patient to return for periodic foot care and evaluation due to potential at risk complications.   Ashe Gago DPM  

## 2021-09-29 DIAGNOSIS — H0231 Blepharochalasis right upper eyelid: Secondary | ICD-10-CM | POA: Diagnosis not present

## 2021-09-29 DIAGNOSIS — Z961 Presence of intraocular lens: Secondary | ICD-10-CM | POA: Diagnosis not present

## 2021-09-29 DIAGNOSIS — E119 Type 2 diabetes mellitus without complications: Secondary | ICD-10-CM | POA: Diagnosis not present

## 2021-09-29 DIAGNOSIS — H0234 Blepharochalasis left upper eyelid: Secondary | ICD-10-CM | POA: Diagnosis not present

## 2021-09-29 LAB — HM DIABETES EYE EXAM

## 2021-09-30 ENCOUNTER — Encounter: Payer: Self-pay | Admitting: Family Medicine

## 2021-10-12 DIAGNOSIS — I5020 Unspecified systolic (congestive) heart failure: Secondary | ICD-10-CM

## 2021-10-12 DIAGNOSIS — I059 Rheumatic mitral valve disease, unspecified: Secondary | ICD-10-CM

## 2021-10-12 DIAGNOSIS — K8309 Other cholangitis: Secondary | ICD-10-CM

## 2021-10-12 HISTORY — DX: Unspecified systolic (congestive) heart failure: I50.20

## 2021-10-12 HISTORY — DX: Other cholangitis: K83.09

## 2021-10-12 HISTORY — DX: Rheumatic mitral valve disease, unspecified: I05.9

## 2021-10-23 ENCOUNTER — Encounter (HOSPITAL_COMMUNITY): Payer: Self-pay

## 2021-10-23 ENCOUNTER — Emergency Department (HOSPITAL_COMMUNITY): Payer: Medicare PPO

## 2021-10-23 ENCOUNTER — Inpatient Hospital Stay (HOSPITAL_COMMUNITY): Payer: Medicare PPO | Admitting: Anesthesiology

## 2021-10-23 ENCOUNTER — Inpatient Hospital Stay (HOSPITAL_COMMUNITY): Payer: Medicare PPO

## 2021-10-23 ENCOUNTER — Other Ambulatory Visit: Payer: Self-pay

## 2021-10-23 ENCOUNTER — Encounter (HOSPITAL_COMMUNITY)
Admission: EM | Disposition: A | Discharge status: Skilled Nursing Facility | Payer: Self-pay | Source: Home / Self Care | Attending: Internal Medicine

## 2021-10-23 ENCOUNTER — Inpatient Hospital Stay (HOSPITAL_COMMUNITY)
Admission: EM | Admit: 2021-10-23 | Discharge: 2021-11-05 | DRG: 871 | Disposition: A | Discharge status: Skilled Nursing Facility | Payer: Medicare PPO | Attending: Internal Medicine | Admitting: Internal Medicine

## 2021-10-23 DIAGNOSIS — R7881 Bacteremia: Secondary | ICD-10-CM

## 2021-10-23 DIAGNOSIS — K8051 Calculus of bile duct without cholangitis or cholecystitis with obstruction: Secondary | ICD-10-CM

## 2021-10-23 DIAGNOSIS — B955 Unspecified streptococcus as the cause of diseases classified elsewhere: Secondary | ICD-10-CM | POA: Diagnosis not present

## 2021-10-23 DIAGNOSIS — A4189 Other specified sepsis: Secondary | ICD-10-CM | POA: Diagnosis not present

## 2021-10-23 DIAGNOSIS — I081 Rheumatic disorders of both mitral and tricuspid valves: Secondary | ICD-10-CM | POA: Diagnosis not present

## 2021-10-23 DIAGNOSIS — N184 Chronic kidney disease, stage 4 (severe): Secondary | ICD-10-CM | POA: Diagnosis present

## 2021-10-23 DIAGNOSIS — A491 Streptococcal infection, unspecified site: Secondary | ICD-10-CM | POA: Diagnosis not present

## 2021-10-23 DIAGNOSIS — R6520 Severe sepsis without septic shock: Secondary | ICD-10-CM | POA: Diagnosis not present

## 2021-10-23 DIAGNOSIS — R0609 Other forms of dyspnea: Secondary | ICD-10-CM

## 2021-10-23 DIAGNOSIS — K8309 Other cholangitis: Secondary | ICD-10-CM

## 2021-10-23 DIAGNOSIS — K8010 Calculus of gallbladder with chronic cholecystitis without obstruction: Secondary | ICD-10-CM | POA: Diagnosis present

## 2021-10-23 DIAGNOSIS — S5012XA Contusion of left forearm, initial encounter: Secondary | ICD-10-CM | POA: Diagnosis present

## 2021-10-23 DIAGNOSIS — E1122 Type 2 diabetes mellitus with diabetic chronic kidney disease: Secondary | ICD-10-CM | POA: Diagnosis present

## 2021-10-23 DIAGNOSIS — I358 Other nonrheumatic aortic valve disorders: Secondary | ICD-10-CM | POA: Diagnosis not present

## 2021-10-23 DIAGNOSIS — J90 Pleural effusion, not elsewhere classified: Secondary | ICD-10-CM | POA: Diagnosis not present

## 2021-10-23 DIAGNOSIS — I130 Hypertensive heart and chronic kidney disease with heart failure and stage 1 through stage 4 chronic kidney disease, or unspecified chronic kidney disease: Secondary | ICD-10-CM | POA: Diagnosis present

## 2021-10-23 DIAGNOSIS — R5383 Other fatigue: Secondary | ICD-10-CM | POA: Diagnosis not present

## 2021-10-23 DIAGNOSIS — E785 Hyperlipidemia, unspecified: Secondary | ICD-10-CM | POA: Diagnosis present

## 2021-10-23 DIAGNOSIS — N179 Acute kidney failure, unspecified: Secondary | ICD-10-CM

## 2021-10-23 DIAGNOSIS — Z6838 Body mass index (BMI) 38.0-38.9, adult: Secondary | ICD-10-CM

## 2021-10-23 DIAGNOSIS — F05 Delirium due to known physiological condition: Secondary | ICD-10-CM | POA: Diagnosis not present

## 2021-10-23 DIAGNOSIS — K8020 Calculus of gallbladder without cholecystitis without obstruction: Secondary | ICD-10-CM | POA: Diagnosis not present

## 2021-10-23 DIAGNOSIS — R1084 Generalized abdominal pain: Secondary | ICD-10-CM | POA: Diagnosis not present

## 2021-10-23 DIAGNOSIS — D6959 Other secondary thrombocytopenia: Secondary | ICD-10-CM | POA: Diagnosis present

## 2021-10-23 DIAGNOSIS — I472 Ventricular tachycardia, unspecified: Secondary | ICD-10-CM | POA: Diagnosis not present

## 2021-10-23 DIAGNOSIS — B9620 Unspecified Escherichia coli [E. coli] as the cause of diseases classified elsewhere: Secondary | ICD-10-CM | POA: Diagnosis not present

## 2021-10-23 DIAGNOSIS — D6489 Other specified anemias: Secondary | ICD-10-CM | POA: Diagnosis present

## 2021-10-23 DIAGNOSIS — Z515 Encounter for palliative care: Secondary | ICD-10-CM

## 2021-10-23 DIAGNOSIS — I493 Ventricular premature depolarization: Secondary | ICD-10-CM | POA: Diagnosis present

## 2021-10-23 DIAGNOSIS — X58XXXA Exposure to other specified factors, initial encounter: Secondary | ICD-10-CM | POA: Diagnosis present

## 2021-10-23 DIAGNOSIS — N201 Calculus of ureter: Secondary | ICD-10-CM | POA: Diagnosis not present

## 2021-10-23 DIAGNOSIS — N170 Acute kidney failure with tubular necrosis: Secondary | ICD-10-CM | POA: Diagnosis not present

## 2021-10-23 DIAGNOSIS — K8031 Calculus of bile duct with cholangitis, unspecified, with obstruction: Secondary | ICD-10-CM | POA: Diagnosis present

## 2021-10-23 DIAGNOSIS — I330 Acute and subacute infective endocarditis: Secondary | ICD-10-CM | POA: Diagnosis not present

## 2021-10-23 DIAGNOSIS — R17 Unspecified jaundice: Secondary | ICD-10-CM | POA: Diagnosis not present

## 2021-10-23 DIAGNOSIS — Z8371 Family history of colonic polyps: Secondary | ICD-10-CM

## 2021-10-23 DIAGNOSIS — I214 Non-ST elevation (NSTEMI) myocardial infarction: Secondary | ICD-10-CM | POA: Diagnosis not present

## 2021-10-23 DIAGNOSIS — A419 Sepsis, unspecified organism: Secondary | ICD-10-CM | POA: Diagnosis not present

## 2021-10-23 DIAGNOSIS — Z79899 Other long term (current) drug therapy: Secondary | ICD-10-CM

## 2021-10-23 DIAGNOSIS — I2510 Atherosclerotic heart disease of native coronary artery without angina pectoris: Secondary | ICD-10-CM

## 2021-10-23 DIAGNOSIS — K7200 Acute and subacute hepatic failure without coma: Secondary | ICD-10-CM | POA: Diagnosis present

## 2021-10-23 DIAGNOSIS — M6281 Muscle weakness (generalized): Secondary | ICD-10-CM | POA: Diagnosis not present

## 2021-10-23 DIAGNOSIS — Z66 Do not resuscitate: Secondary | ICD-10-CM | POA: Diagnosis present

## 2021-10-23 DIAGNOSIS — E669 Obesity, unspecified: Secondary | ICD-10-CM | POA: Diagnosis present

## 2021-10-23 DIAGNOSIS — E860 Dehydration: Secondary | ICD-10-CM | POA: Diagnosis present

## 2021-10-23 DIAGNOSIS — K8033 Calculus of bile duct with acute cholangitis with obstruction: Secondary | ICD-10-CM | POA: Diagnosis not present

## 2021-10-23 DIAGNOSIS — I5021 Acute systolic (congestive) heart failure: Secondary | ICD-10-CM | POA: Diagnosis not present

## 2021-10-23 DIAGNOSIS — R000 Tachycardia, unspecified: Secondary | ICD-10-CM | POA: Diagnosis not present

## 2021-10-23 DIAGNOSIS — I059 Rheumatic mitral valve disease, unspecified: Secondary | ICD-10-CM | POA: Diagnosis not present

## 2021-10-23 DIAGNOSIS — K219 Gastro-esophageal reflux disease without esophagitis: Secondary | ICD-10-CM | POA: Diagnosis present

## 2021-10-23 DIAGNOSIS — I959 Hypotension, unspecified: Secondary | ICD-10-CM | POA: Diagnosis not present

## 2021-10-23 DIAGNOSIS — I25119 Atherosclerotic heart disease of native coronary artery with unspecified angina pectoris: Secondary | ICD-10-CM

## 2021-10-23 DIAGNOSIS — I480 Paroxysmal atrial fibrillation: Secondary | ICD-10-CM | POA: Diagnosis not present

## 2021-10-23 DIAGNOSIS — N183 Chronic kidney disease, stage 3 unspecified: Secondary | ICD-10-CM | POA: Diagnosis not present

## 2021-10-23 DIAGNOSIS — R52 Pain, unspecified: Secondary | ICD-10-CM

## 2021-10-23 DIAGNOSIS — R7401 Elevation of levels of liver transaminase levels: Secondary | ICD-10-CM

## 2021-10-23 DIAGNOSIS — R7989 Other specified abnormal findings of blood chemistry: Secondary | ICD-10-CM | POA: Diagnosis not present

## 2021-10-23 DIAGNOSIS — I4891 Unspecified atrial fibrillation: Secondary | ICD-10-CM

## 2021-10-23 DIAGNOSIS — E872 Acidosis, unspecified: Secondary | ICD-10-CM | POA: Diagnosis present

## 2021-10-23 DIAGNOSIS — Z7984 Long term (current) use of oral hypoglycemic drugs: Secondary | ICD-10-CM

## 2021-10-23 DIAGNOSIS — Z7189 Other specified counseling: Secondary | ICD-10-CM | POA: Diagnosis not present

## 2021-10-23 DIAGNOSIS — R2689 Other abnormalities of gait and mobility: Secondary | ICD-10-CM | POA: Diagnosis not present

## 2021-10-23 DIAGNOSIS — R410 Disorientation, unspecified: Secondary | ICD-10-CM | POA: Diagnosis not present

## 2021-10-23 DIAGNOSIS — Z7401 Bed confinement status: Secondary | ICD-10-CM | POA: Diagnosis not present

## 2021-10-23 DIAGNOSIS — E119 Type 2 diabetes mellitus without complications: Secondary | ICD-10-CM

## 2021-10-23 DIAGNOSIS — N401 Enlarged prostate with lower urinary tract symptoms: Secondary | ICD-10-CM | POA: Diagnosis present

## 2021-10-23 DIAGNOSIS — Z951 Presence of aortocoronary bypass graft: Secondary | ICD-10-CM | POA: Diagnosis not present

## 2021-10-23 DIAGNOSIS — E876 Hypokalemia: Secondary | ICD-10-CM | POA: Diagnosis present

## 2021-10-23 DIAGNOSIS — E1165 Type 2 diabetes mellitus with hyperglycemia: Secondary | ICD-10-CM | POA: Diagnosis present

## 2021-10-23 DIAGNOSIS — A408 Other streptococcal sepsis: Secondary | ICD-10-CM | POA: Diagnosis present

## 2021-10-23 DIAGNOSIS — I499 Cardiac arrhythmia, unspecified: Secondary | ICD-10-CM | POA: Diagnosis not present

## 2021-10-23 DIAGNOSIS — I4729 Other ventricular tachycardia: Secondary | ICD-10-CM

## 2021-10-23 DIAGNOSIS — I10 Essential (primary) hypertension: Secondary | ICD-10-CM

## 2021-10-23 DIAGNOSIS — A4151 Sepsis due to Escherichia coli [E. coli]: Secondary | ICD-10-CM | POA: Diagnosis not present

## 2021-10-23 DIAGNOSIS — R0602 Shortness of breath: Secondary | ICD-10-CM

## 2021-10-23 DIAGNOSIS — Z20822 Contact with and (suspected) exposure to covid-19: Secondary | ICD-10-CM | POA: Diagnosis present

## 2021-10-23 DIAGNOSIS — Z8249 Family history of ischemic heart disease and other diseases of the circulatory system: Secondary | ICD-10-CM

## 2021-10-23 DIAGNOSIS — M542 Cervicalgia: Secondary | ICD-10-CM | POA: Diagnosis not present

## 2021-10-23 DIAGNOSIS — G9341 Metabolic encephalopathy: Secondary | ICD-10-CM | POA: Diagnosis not present

## 2021-10-23 DIAGNOSIS — Z833 Family history of diabetes mellitus: Secondary | ICD-10-CM

## 2021-10-23 DIAGNOSIS — Z7982 Long term (current) use of aspirin: Secondary | ICD-10-CM

## 2021-10-23 DIAGNOSIS — I517 Cardiomegaly: Secondary | ICD-10-CM | POA: Diagnosis not present

## 2021-10-23 DIAGNOSIS — Z8701 Personal history of pneumonia (recurrent): Secondary | ICD-10-CM

## 2021-10-23 DIAGNOSIS — Z87891 Personal history of nicotine dependence: Secondary | ICD-10-CM

## 2021-10-23 DIAGNOSIS — R008 Other abnormalities of heart beat: Secondary | ICD-10-CM | POA: Diagnosis present

## 2021-10-23 DIAGNOSIS — D649 Anemia, unspecified: Secondary | ICD-10-CM | POA: Diagnosis not present

## 2021-10-23 DIAGNOSIS — R54 Age-related physical debility: Secondary | ICD-10-CM | POA: Diagnosis present

## 2021-10-23 DIAGNOSIS — K838 Other specified diseases of biliary tract: Secondary | ICD-10-CM | POA: Diagnosis not present

## 2021-10-23 DIAGNOSIS — I129 Hypertensive chronic kidney disease with stage 1 through stage 4 chronic kidney disease, or unspecified chronic kidney disease: Secondary | ICD-10-CM | POA: Diagnosis not present

## 2021-10-23 DIAGNOSIS — I340 Nonrheumatic mitral (valve) insufficiency: Secondary | ICD-10-CM | POA: Diagnosis not present

## 2021-10-23 DIAGNOSIS — K529 Noninfective gastroenteritis and colitis, unspecified: Secondary | ICD-10-CM | POA: Diagnosis present

## 2021-10-23 DIAGNOSIS — Z87442 Personal history of urinary calculi: Secondary | ICD-10-CM

## 2021-10-23 DIAGNOSIS — R278 Other lack of coordination: Secondary | ICD-10-CM | POA: Diagnosis not present

## 2021-10-23 DIAGNOSIS — R079 Chest pain, unspecified: Secondary | ICD-10-CM | POA: Diagnosis not present

## 2021-10-23 DIAGNOSIS — M1990 Unspecified osteoarthritis, unspecified site: Secondary | ICD-10-CM | POA: Diagnosis present

## 2021-10-23 DIAGNOSIS — I491 Atrial premature depolarization: Secondary | ICD-10-CM | POA: Diagnosis not present

## 2021-10-23 DIAGNOSIS — Z86010 Personal history of colonic polyps: Secondary | ICD-10-CM

## 2021-10-23 HISTORY — PX: REMOVAL OF STONES: SHX5545

## 2021-10-23 HISTORY — PX: ERCP: SHX5425

## 2021-10-23 HISTORY — PX: SPHINCTEROTOMY: SHX5544

## 2021-10-23 LAB — PROTIME-INR
INR: 1.1 (ref 0.8–1.2)
Prothrombin Time: 13.7 seconds (ref 11.4–15.2)

## 2021-10-23 LAB — CBC WITH DIFFERENTIAL/PLATELET
Abs Immature Granulocytes: 0.05 10*3/uL (ref 0.00–0.07)
Abs Immature Granulocytes: 0.32 10*3/uL — ABNORMAL HIGH (ref 0.00–0.07)
Basophils Absolute: 0 10*3/uL (ref 0.0–0.1)
Basophils Absolute: 0 10*3/uL (ref 0.0–0.1)
Basophils Relative: 0 %
Basophils Relative: 0 %
Eosinophils Absolute: 0 10*3/uL (ref 0.0–0.5)
Eosinophils Absolute: 0 10*3/uL (ref 0.0–0.5)
Eosinophils Relative: 0 %
Eosinophils Relative: 0 %
HCT: 40.6 % (ref 39.0–52.0)
HCT: 30.6 % — ABNORMAL LOW (ref 39.0–52.0)
Hemoglobin: 12.9 g/dL — ABNORMAL LOW (ref 13.0–17.0)
Hemoglobin: 9.5 g/dL — ABNORMAL LOW (ref 13.0–17.0)
Immature Granulocytes: 1 %
Immature Granulocytes: 2 %
Lymphocytes Relative: 4 %
Lymphocytes Relative: 4 %
Lymphs Abs: 0.3 10*3/uL — ABNORMAL LOW (ref 0.7–4.0)
Lymphs Abs: 0.7 10*3/uL (ref 0.7–4.0)
MCH: 29.8 pg (ref 26.0–34.0)
MCH: 29.6 pg (ref 26.0–34.0)
MCHC: 31.8 g/dL (ref 30.0–36.0)
MCHC: 31 g/dL (ref 30.0–36.0)
MCV: 93.8 fL (ref 80.0–100.0)
MCV: 95.3 fL (ref 80.0–100.0)
Monocytes Absolute: 0.1 10*3/uL (ref 0.1–1.0)
Monocytes Absolute: 0.7 10*3/uL (ref 0.1–1.0)
Monocytes Relative: 1 %
Monocytes Relative: 4 %
Neutro Abs: 7.4 10*3/uL (ref 1.7–7.7)
Neutro Abs: 14.4 10*3/uL — ABNORMAL HIGH (ref 1.7–7.7)
Neutrophils Relative %: 94 %
Neutrophils Relative %: 90 %
Platelets: 193 10*3/uL (ref 150–400)
Platelets: 96 10*3/uL — ABNORMAL LOW (ref 150–400)
RBC: 4.33 MIL/uL (ref 4.22–5.81)
RBC: 3.21 MIL/uL — ABNORMAL LOW (ref 4.22–5.81)
RDW: 14.7 % (ref 11.5–15.5)
RDW: 15.7 % — ABNORMAL HIGH (ref 11.5–15.5)
Smear Review: DECREASED
WBC: 7.9 10*3/uL (ref 4.0–10.5)
WBC: 16.2 10*3/uL — ABNORMAL HIGH (ref 4.0–10.5)
nRBC: 0 % (ref 0.0–0.2)
nRBC: 0 % (ref 0.0–0.2)

## 2021-10-23 LAB — URINALYSIS, ROUTINE W REFLEX MICROSCOPIC
Glucose, UA: 50 mg/dL — AB
Hgb urine dipstick: NEGATIVE
Ketones, ur: NEGATIVE mg/dL
Leukocytes,Ua: NEGATIVE
Nitrite: NEGATIVE
Protein, ur: >=300 mg/dL — AB
Specific Gravity, Urine: 1.027 (ref 1.005–1.030)
pH: 5 (ref 5.0–8.0)

## 2021-10-23 LAB — BLOOD CULTURE ID PANEL (REFLEXED) - BCID2
A.calcoaceticus-baumannii: NOT DETECTED
Bacteroides fragilis: NOT DETECTED
CTX-M ESBL: NOT DETECTED
Candida albicans: NOT DETECTED
Candida auris: NOT DETECTED
Candida glabrata: NOT DETECTED
Candida krusei: NOT DETECTED
Candida parapsilosis: NOT DETECTED
Candida tropicalis: NOT DETECTED
Carbapenem resist OXA 48 LIKE: NOT DETECTED
Carbapenem resistance IMP: NOT DETECTED
Carbapenem resistance KPC: NOT DETECTED
Carbapenem resistance NDM: NOT DETECTED
Carbapenem resistance VIM: NOT DETECTED
Cryptococcus neoformans/gattii: NOT DETECTED
Enterobacter cloacae complex: NOT DETECTED
Enterobacterales: DETECTED — AB
Enterococcus Faecium: NOT DETECTED
Enterococcus faecalis: NOT DETECTED
Escherichia coli: DETECTED — AB
Haemophilus influenzae: NOT DETECTED
Klebsiella aerogenes: NOT DETECTED
Klebsiella oxytoca: NOT DETECTED
Klebsiella pneumoniae: NOT DETECTED
Listeria monocytogenes: NOT DETECTED
Neisseria meningitidis: NOT DETECTED
Proteus species: NOT DETECTED
Pseudomonas aeruginosa: NOT DETECTED
Salmonella species: NOT DETECTED
Serratia marcescens: NOT DETECTED
Staphylococcus aureus (BCID): NOT DETECTED
Staphylococcus epidermidis: NOT DETECTED
Staphylococcus lugdunensis: NOT DETECTED
Staphylococcus species: NOT DETECTED
Stenotrophomonas maltophilia: NOT DETECTED
Streptococcus agalactiae: NOT DETECTED
Streptococcus pneumoniae: NOT DETECTED
Streptococcus pyogenes: NOT DETECTED
Streptococcus species: DETECTED — AB

## 2021-10-23 LAB — BASIC METABOLIC PANEL
Anion gap: 17 — ABNORMAL HIGH (ref 5–15)
BUN: 34 mg/dL — ABNORMAL HIGH (ref 8–23)
CO2: 14 mmol/L — ABNORMAL LOW (ref 22–32)
Calcium: 7.6 mg/dL — ABNORMAL LOW (ref 8.9–10.3)
Chloride: 105 mmol/L (ref 98–111)
Creatinine, Ser: 2.67 mg/dL — ABNORMAL HIGH (ref 0.61–1.24)
GFR, Estimated: 24 mL/min — ABNORMAL LOW (ref >60)
Glucose, Bld: 108 mg/dL — ABNORMAL HIGH (ref 70–99)
Potassium: 4.1 mmol/L (ref 3.5–5.1)
Sodium: 136 mmol/L (ref 135–145)

## 2021-10-23 LAB — BLOOD GAS, VENOUS
Acid-base deficit: 7.3 mmol/L — ABNORMAL HIGH (ref 0.0–2.0)
Bicarbonate: 17.9 mmol/L — ABNORMAL LOW (ref 20.0–28.0)
O2 Saturation: 50.9 %
Patient temperature: 36.8
pCO2, Ven: 37.4 mmHg — ABNORMAL LOW (ref 44.0–60.0)
pH, Ven: 7.301 (ref 7.250–7.430)
pO2, Ven: 31.1 mmHg — CL (ref 32.0–45.0)

## 2021-10-23 LAB — COMPREHENSIVE METABOLIC PANEL
ALT: 235 U/L — ABNORMAL HIGH (ref 0–44)
AST: 532 U/L — ABNORMAL HIGH (ref 15–41)
Albumin: 3.1 g/dL — ABNORMAL LOW (ref 3.5–5.0)
Alkaline Phosphatase: 149 U/L — ABNORMAL HIGH (ref 38–126)
Anion gap: 18 — ABNORMAL HIGH (ref 5–15)
BUN: 24 mg/dL — ABNORMAL HIGH (ref 8–23)
CO2: 13 mmol/L — ABNORMAL LOW (ref 22–32)
Calcium: 9.1 mg/dL (ref 8.9–10.3)
Chloride: 106 mmol/L (ref 98–111)
Creatinine, Ser: 1.91 mg/dL — ABNORMAL HIGH (ref 0.61–1.24)
GFR, Estimated: 35 mL/min — ABNORMAL LOW (ref >60)
Glucose, Bld: 219 mg/dL — ABNORMAL HIGH (ref 70–99)
Potassium: 4.9 mmol/L (ref 3.5–5.1)
Sodium: 137 mmol/L (ref 135–145)
Total Bilirubin: 5.9 mg/dL — ABNORMAL HIGH (ref 0.3–1.2)
Total Protein: 6.1 g/dL — ABNORMAL LOW (ref 6.5–8.1)

## 2021-10-23 LAB — MAGNESIUM
Magnesium: 1.4 mg/dL — ABNORMAL LOW (ref 1.7–2.4)
Magnesium: 1.3 mg/dL — ABNORMAL LOW (ref 1.7–2.4)

## 2021-10-23 LAB — CBC
HCT: 38.3 % — ABNORMAL LOW (ref 39.0–52.0)
Hemoglobin: 12 g/dL — ABNORMAL LOW (ref 13.0–17.0)
MCH: 29.6 pg (ref 26.0–34.0)
MCHC: 31.3 g/dL (ref 30.0–36.0)
MCV: 94.3 fL (ref 80.0–100.0)
Platelets: 149 10*3/uL — ABNORMAL LOW (ref 150–400)
RBC: 4.06 MIL/uL — ABNORMAL LOW (ref 4.22–5.81)
RDW: 15.2 % (ref 11.5–15.5)
WBC: 14.8 10*3/uL — ABNORMAL HIGH (ref 4.0–10.5)
nRBC: 0 % (ref 0.0–0.2)

## 2021-10-23 LAB — TROPONIN I (HIGH SENSITIVITY)
Troponin I (High Sensitivity): 68 ng/L — ABNORMAL HIGH (ref <18)
Troponin I (High Sensitivity): 142 ng/L (ref <18)

## 2021-10-23 LAB — GLUCOSE, CAPILLARY
Glucose-Capillary: 135 mg/dL — ABNORMAL HIGH (ref 70–99)
Glucose-Capillary: 112 mg/dL — ABNORMAL HIGH (ref 70–99)
Glucose-Capillary: 90 mg/dL (ref 70–99)
Glucose-Capillary: 112 mg/dL — ABNORMAL HIGH (ref 70–99)
Glucose-Capillary: 124 mg/dL — ABNORMAL HIGH (ref 70–99)

## 2021-10-23 LAB — I-STAT VENOUS BLOOD GAS, ED
Acid-base deficit: 8 mmol/L — ABNORMAL HIGH (ref 0.0–2.0)
Bicarbonate: 15.2 mmol/L — ABNORMAL LOW (ref 20.0–28.0)
Calcium, Ion: 1.1 mmol/L — ABNORMAL LOW (ref 1.15–1.40)
HCT: 40 % (ref 39.0–52.0)
Hemoglobin: 13.6 g/dL (ref 13.0–17.0)
O2 Saturation: 87 %
Patient temperature: 37
Potassium: 5.1 mmol/L (ref 3.5–5.1)
Sodium: 139 mmol/L (ref 135–145)
TCO2: 16 mmol/L — ABNORMAL LOW (ref 22–32)
pCO2, Ven: 23.9 mmHg — ABNORMAL LOW (ref 44.0–60.0)
pH, Ven: 7.412 (ref 7.250–7.430)
pO2, Ven: 51 mmHg — ABNORMAL HIGH (ref 32.0–45.0)

## 2021-10-23 LAB — HEMOGLOBIN A1C
Hgb A1c MFr Bld: 6.1 % — ABNORMAL HIGH (ref 4.8–5.6)
Mean Plasma Glucose: 128.37 mg/dL

## 2021-10-23 LAB — ECHOCARDIOGRAM COMPLETE
Area-P 1/2: 4.49 cm2
Height: 71 in
S' Lateral: 4.6 cm
Single Plane A2C EF: 34.1 %
Weight: 3680 oz

## 2021-10-23 LAB — CREATININE, SERUM
Creatinine, Ser: 2.11 mg/dL — ABNORMAL HIGH (ref 0.61–1.24)
GFR, Estimated: 31 mL/min — ABNORMAL LOW (ref >60)

## 2021-10-23 LAB — RESP PANEL BY RT-PCR (FLU A&B, COVID) ARPGX2
Influenza A by PCR: NEGATIVE
Influenza B by PCR: NEGATIVE
SARS Coronavirus 2 by RT PCR: NEGATIVE

## 2021-10-23 LAB — AMMONIA: Ammonia: 57 umol/L — ABNORMAL HIGH (ref 9–35)

## 2021-10-23 LAB — CBG MONITORING, ED: Glucose-Capillary: 119 mg/dL — ABNORMAL HIGH (ref 70–99)

## 2021-10-23 LAB — LACTIC ACID, PLASMA
Lactic Acid, Venous: 8.9 mmol/L (ref 0.5–1.9)
Lactic Acid, Venous: 7.8 mmol/L (ref 0.5–1.9)
Lactic Acid, Venous: 6.5 mmol/L (ref 0.5–1.9)
Lactic Acid, Venous: 6.5 mmol/L (ref 0.5–1.9)
Lactic Acid, Venous: 7.3 mmol/L (ref 0.5–1.9)

## 2021-10-23 LAB — LIPASE, BLOOD: Lipase: 390 U/L — ABNORMAL HIGH (ref 11–51)

## 2021-10-23 LAB — MRSA NEXT GEN BY PCR, NASAL: MRSA by PCR Next Gen: NOT DETECTED

## 2021-10-23 SURGERY — ERCP, WITH INTERVENTION IF INDICATED
Anesthesia: General

## 2021-10-23 MED ORDER — SUGAMMADEX SODIUM 200 MG/2ML IV SOLN
INTRAVENOUS | Status: DC | PRN
  Administered 2021-10-23: 300 mg via INTRAVENOUS

## 2021-10-23 MED ORDER — LACTATED RINGERS IV BOLUS (SEPSIS)
1100.0000 mL | Freq: Once | INTRAVENOUS | Status: AC
  Administered 2021-10-23: 1100 mL via INTRAVENOUS

## 2021-10-23 MED ORDER — MAGNESIUM SULFATE 2 GM/50ML IV SOLN
2.0000 g | Freq: Once | INTRAVENOUS | Status: AC
  Administered 2021-10-23: 2 g via INTRAVENOUS
  Filled 2021-10-23: qty 50

## 2021-10-23 MED ORDER — LACTATED RINGERS IV BOLUS
1000.0000 mL | Freq: Once | INTRAVENOUS | Status: AC
Start: 2021-10-23 — End: 2021-10-23
  Administered 2021-10-23: 1000 mL via INTRAVENOUS

## 2021-10-23 MED ORDER — ALBUMIN HUMAN 5 % IV SOLN
12.5000 g | Freq: Once | INTRAVENOUS | Status: AC
  Administered 2021-10-23: 12.5 g via INTRAVENOUS
  Filled 2021-10-23: qty 250

## 2021-10-23 MED ORDER — FENTANYL CITRATE (PF) 100 MCG/2ML IJ SOLN
INTRAMUSCULAR | Status: AC
  Filled 2021-10-23: qty 2

## 2021-10-23 MED ORDER — PROPOFOL 10 MG/ML IV BOLUS
INTRAVENOUS | Status: DC | PRN
  Administered 2021-10-23: 100 mg via INTRAVENOUS

## 2021-10-23 MED ORDER — FENTANYL CITRATE (PF) 250 MCG/5ML IJ SOLN
INTRAMUSCULAR | Status: DC | PRN
Start: 2021-10-23 — End: 2021-10-23
  Administered 2021-10-23 (×2): 50 ug via INTRAVENOUS

## 2021-10-23 MED ORDER — SODIUM CHLORIDE 0.9 % IV SOLN
2.0000 g | INTRAVENOUS | Status: DC
  Administered 2021-10-23 – 2021-10-25 (×3): 2 g via INTRAVENOUS
  Filled 2021-10-23 (×3): qty 20

## 2021-10-23 MED ORDER — SUCCINYLCHOLINE CHLORIDE 200 MG/10ML IV SOSY
PREFILLED_SYRINGE | INTRAVENOUS | Status: DC | PRN
  Administered 2021-10-23: 120 mg via INTRAVENOUS

## 2021-10-23 MED ORDER — GLUCAGON HCL RDNA (DIAGNOSTIC) 1 MG IJ SOLR
INTRAMUSCULAR | Status: AC
  Filled 2021-10-23: qty 1

## 2021-10-23 MED ORDER — PIPERACILLIN-TAZOBACTAM 3.375 G IVPB
3.3750 g | Freq: Three times a day (TID) | INTRAVENOUS | Status: DC
  Filled 2021-10-23: qty 50

## 2021-10-23 MED ORDER — INDOMETHACIN 50 MG RE SUPP: 100.0000 mg | Freq: Once | RECTAL | Status: DC

## 2021-10-23 MED ORDER — INDOMETHACIN 50 MG RE SUPP
RECTAL | Status: DC | PRN
  Administered 2021-10-23: 100 mg via RECTAL

## 2021-10-23 MED ORDER — INDOMETHACIN 50 MG RE SUPP
RECTAL | Status: AC
  Filled 2021-10-23: qty 2

## 2021-10-23 MED ORDER — LIDOCAINE VISCOUS HCL 2 % MT SOLN
15.0000 mL | Freq: Once | OROMUCOSAL | Status: DC
  Filled 2021-10-23: qty 15

## 2021-10-23 MED ORDER — FAMOTIDINE IN NACL 20-0.9 MG/50ML-% IV SOLN
20.0000 mg | Freq: Once | INTRAVENOUS | Status: AC
  Administered 2021-10-23: 20 mg via INTRAVENOUS
  Filled 2021-10-23: qty 50

## 2021-10-23 MED ORDER — PHENYLEPHRINE 40 MCG/ML (10ML) SYRINGE FOR IV PUSH (FOR BLOOD PRESSURE SUPPORT)
PREFILLED_SYRINGE | INTRAVENOUS | Status: DC | PRN
  Administered 2021-10-23 (×2): 120 ug via INTRAVENOUS
  Administered 2021-10-23 (×2): 80 ug via INTRAVENOUS

## 2021-10-23 MED ORDER — ALUM & MAG HYDROXIDE-SIMETH 200-200-20 MG/5ML PO SUSP
30.0000 mL | Freq: Once | ORAL | Status: DC
  Filled 2021-10-23: qty 30

## 2021-10-23 MED ORDER — EPHEDRINE SULFATE (PRESSORS) 50 MG/ML IJ SOLN
INTRAMUSCULAR | Status: DC | PRN
  Administered 2021-10-23: 10 mg via INTRAVENOUS

## 2021-10-23 MED ORDER — POLYETHYLENE GLYCOL 3350 17 G PO PACK: 17.0000 g | PACK | Freq: Every day | ORAL | Status: DC | PRN

## 2021-10-23 MED ORDER — AMIODARONE HCL IN DEXTROSE 360-4.14 MG/200ML-% IV SOLN
60.0000 mg/h | INTRAVENOUS | Status: DC
  Administered 2021-10-23 (×2): 60 mg/h via INTRAVENOUS
  Filled 2021-10-23: qty 200

## 2021-10-23 MED ORDER — METRONIDAZOLE 500 MG/100ML IV SOLN
500.0000 mg | Freq: Two times a day (BID) | INTRAVENOUS | Status: DC
  Administered 2021-10-23 – 2021-10-25 (×4): 500 mg via INTRAVENOUS
  Filled 2021-10-23 (×4): qty 100

## 2021-10-23 MED ORDER — AMIODARONE LOAD VIA INFUSION
150.0000 mg | Freq: Once | INTRAVENOUS | Status: AC
  Administered 2021-10-23 (×2): 150 mg via INTRAVENOUS
  Filled 2021-10-23: qty 83.34

## 2021-10-23 MED ORDER — ONDANSETRON HCL 4 MG/2ML IJ SOLN
4.0000 mg | Freq: Once | INTRAMUSCULAR | Status: AC
  Administered 2021-10-23: 4 mg via INTRAVENOUS
  Filled 2021-10-23: qty 2

## 2021-10-23 MED ORDER — MORPHINE SULFATE (PF) 4 MG/ML IV SOLN
4.0000 mg | Freq: Once | INTRAVENOUS | Status: AC
  Administered 2021-10-23: 4 mg via INTRAVENOUS
  Filled 2021-10-23: qty 1

## 2021-10-23 MED ORDER — PHENYLEPHRINE HCL-NACL 20-0.9 MG/250ML-% IV SOLN
INTRAVENOUS | Status: DC | PRN
  Administered 2021-10-23: 40 ug/min via INTRAVENOUS

## 2021-10-23 MED ORDER — RINGERS IV SOLN: INTRAVENOUS | Status: DC

## 2021-10-23 MED ORDER — LIDOCAINE 2% (20 MG/ML) 5 ML SYRINGE
INTRAMUSCULAR | Status: DC | PRN
  Administered 2021-10-23: 100 mg via INTRAVENOUS

## 2021-10-23 MED ORDER — IOHEXOL 350 MG/ML SOLN
75.0000 mL | Freq: Once | INTRAVENOUS | Status: AC | PRN
  Administered 2021-10-23: 75 mL via INTRAVENOUS

## 2021-10-23 MED ORDER — SODIUM CHLORIDE 0.9 % IV SOLN: INTRAVENOUS | Status: DC

## 2021-10-23 MED ORDER — LACTATED RINGERS IV BOLUS
1000.0000 mL | Freq: Once | INTRAVENOUS | Status: AC
  Administered 2021-10-23: 1000 mL via INTRAVENOUS

## 2021-10-23 MED ORDER — HEPARIN SODIUM (PORCINE) 5000 UNIT/ML IJ SOLN
5000.0000 [IU] | Freq: Three times a day (TID) | INTRAMUSCULAR | Status: DC
  Administered 2021-10-23 – 2021-10-25 (×6): 5000 [IU] via SUBCUTANEOUS
  Filled 2021-10-23 (×7): qty 1

## 2021-10-23 MED ORDER — SODIUM BICARBONATE 8.4 % IV SOLN
INTRAVENOUS | Status: DC
  Filled 2021-10-23: qty 1000

## 2021-10-23 MED ORDER — PIPERACILLIN-TAZOBACTAM 3.375 G IVPB 30 MIN
3.3750 g | Freq: Once | INTRAVENOUS | Status: AC
  Administered 2021-10-23: 3.375 g via INTRAVENOUS
  Filled 2021-10-23: qty 50

## 2021-10-23 MED ORDER — LACTATED RINGERS IV SOLN: INTRAVENOUS | Status: DC

## 2021-10-23 MED ORDER — DOCUSATE SODIUM 100 MG PO CAPS
100.0000 mg | ORAL_CAPSULE | Freq: Two times a day (BID) | ORAL | Status: DC | PRN
  Filled 2021-10-23: qty 1

## 2021-10-23 MED ORDER — GLUCAGON HCL RDNA (DIAGNOSTIC) 1 MG IJ SOLR
INTRAMUSCULAR | Status: DC | PRN
  Administered 2021-10-23 (×2): .25 mg via INTRAVENOUS

## 2021-10-23 MED ORDER — CHLORHEXIDINE GLUCONATE CLOTH 2 % EX PADS
6.0000 | MEDICATED_PAD | Freq: Every day | CUTANEOUS | Status: DC
  Administered 2021-10-23 – 2021-11-05 (×12): 6 via TOPICAL

## 2021-10-23 MED ORDER — INSULIN ASPART 100 UNIT/ML IJ SOLN
0.0000 [IU] | INTRAMUSCULAR | Status: DC
  Administered 2021-10-24 – 2021-10-27 (×8): 2 [IU] via SUBCUTANEOUS
  Administered 2021-10-27: 5 [IU] via SUBCUTANEOUS
  Filled 2021-10-23: qty 0.15

## 2021-10-23 MED ORDER — SODIUM CHLORIDE 0.9 % IV SOLN
INTRAVENOUS | Status: DC | PRN
  Administered 2021-10-23: 25 mL

## 2021-10-23 MED ORDER — AMIODARONE HCL IN DEXTROSE 360-4.14 MG/200ML-% IV SOLN
30.0000 mg/h | INTRAVENOUS | Status: DC
  Administered 2021-10-23 (×2): 30 mg/h via INTRAVENOUS
  Filled 2021-10-23 (×4): qty 200

## 2021-10-23 MED ORDER — ONDANSETRON HCL 4 MG/2ML IJ SOLN: 4.0000 mg | Freq: Four times a day (QID) | INTRAMUSCULAR | Status: DC | PRN

## 2021-10-23 MED ORDER — CALCIUM GLUCONATE-NACL 1-0.675 GM/50ML-% IV SOLN
1.0000 g | Freq: Once | INTRAVENOUS | Status: AC
  Administered 2021-10-24: 1000 mg via INTRAVENOUS
  Filled 2021-10-23: qty 50

## 2021-10-23 MED ORDER — ROCURONIUM BROMIDE 10 MG/ML (PF) SYRINGE
PREFILLED_SYRINGE | INTRAVENOUS | Status: DC | PRN
  Administered 2021-10-23: 50 mg via INTRAVENOUS

## 2021-10-23 MED ORDER — ONDANSETRON HCL 4 MG/2ML IJ SOLN
INTRAMUSCULAR | Status: DC | PRN
  Administered 2021-10-23: 4 mg via INTRAVENOUS

## 2021-10-23 NOTE — ED Notes (Signed)
Family update about next stage of care. Family aware of ERCP.

## 2021-10-23 NOTE — ED Notes (Signed)
Pt transported to Endo 

## 2021-10-23 NOTE — Progress Notes (Signed)
°  Echocardiogram 2D Echocardiogram has been performed.  [name] Young 10/23/2021, 4:41 PM

## 2021-10-23 NOTE — Consult Note (Addendum)
Referring Provider: Dr. Aletta Edouard  Primary Care Physician:  Ria Bush, MD Primary Gastroenterologist:  Dr. Ardis Hughs  Reason for Consultation: Abdominal pain, elevated LFTs  HPI: [name] Young. is a 81 y.o. male with a past medical history of hypertension, coronary artery disease s/p CABG 2019, atrial fibrillation, hyperlipidemia, diabetes mellitus type 2, CKD, GERD and colon polyps.  He developed generalized abdominal and back pain around 4 PM after eating BBQ on 10/22/2021.  He took Pepto-Bismol and Tums without relief.  He vomited x 2.  Emesis consisted of Coca-Cola and Pepto-Bismol.  No frank hematemesis.  He was profoundly weak and restless during the night. He was unable to get out of  bed and he continued to have generalized abdominal pain therefore his wife called 911. He was transported to [facility] ED at 3:30 AM today.  Upon arrival to the ED he was tachypneic, tachycardic and EMS assessed he was in A-fib with RVR and administered Cardizem 10 mg IV.  In the ED, he was in nonsustained VT be in nonsustained VT treated with Amiodarone load and infusion. He received 2L of Lactated Ringer's.  His family reported patient had dysarthria, stroke scale evaluation by EMS was negative.  Head CT was negative.  Labs in the ED showed a WBC count 7.9.  Hemoglobin 12.9 which is his baseline level.  Glucose 219.  BUN 24.  Creatinine 1.91 (Cr.  1.34 on 06/07/2021).  Total bili 5.9.  Alk phos 149.  AST 532.  ALT 235.  Lipase 390.  Initial troponin level 68 -> repeat troponin level 142. INR 1.1.  SARS coronavirus 2 negative.  Lactic acid 8.9.  Ammonia 57.  Blood cultures and urinalysis pending.  Twelve-lead EKG showed sinus tach with ventricular bigeminy, no evidence of acute ischemia. Chest x ray showed stable mild cardiomegaly without acute cardiopulmonary disease.  Abdominal/pelvic chest CTA with contrast identified cholelithiasis without evidence of acute cholecystitis, dilatation of  the CBD without intrahepatic biliary ductal dilatation.  An abdominal MRI/MRCP has been ordered to further evaluate the biliary tree.  No evidence of an aortic dissection.  GI consult was requested for further evaluation regarding gallstones with elevated LFTs and lipase level with CBD dilatation per CTA concerning for biliary obstruction, may require ERCP.  He is alert and fatigued.  He continues to have mild central abdominal pain.  No further vomiting since arriving to the ED.  Jaundice started early this morning.  Decreased urine output overnight.  No prior history of gallstones or pancreatitis.  He is on aspirin a day.  Not on anticoagulation. He takes Prilosec 20 mg 3 days weekly for remote history of GERD.  Denies ever having an EGD.  No history of PUD.  He typically passes a normal formed brown bowel movement daily.  No rectal bleeding or black stool.  He underwent a colonoscopy 06/2019 which identified 6 tubular adenomatous polyps which were removed from the colon.  He denies having any chest pain or palpitations.  His wife is at the bedside.   Past Medical History:  Diagnosis Date   Arthritis    Cataract    lens implant bilateral   Chronic kidney disease    kidney stones   Coronary artery disease    Dyspnea    Dysrhythmia    GERD (gastroesophageal reflux disease)    History of cholelithiasis    History of kidney stones    ca ox [name] @ [facility]) now [place]   History of pneumonia  HLD (hyperlipidemia)    HTN (hypertension)    Jaundice    age 79   Pneumonia    years ago    T2DM (type 2 diabetes mellitus) ([place]) 2010    Past Surgical History:  Procedure Laterality Date   CARPAL TUNNEL RELEASE Bilateral    CATARACT EXTRACTION, BILATERAL     COLONOSCOPY  11/2012   11 adenomatous polyps, diverticulosis, rec rpt 1 yr Ardis Hughs)   COLONOSCOPY  12/2013   3 polyps, diverticulosis, rec rpt 3 yrs Ardis Hughs)   COLONOSCOPY  06/2019   6 polyps (TA), diverticulosis, f/u left open  ended Ardis Hughs)   CORONARY ARTERY BYPASS GRAFT N/A 01/18/2018   Procedure: CORONARY ARTERY BYPASS GRAFTING (CABG) x 3; Using Left Internal Mammary Artery, and Right Greater Saphenous Vein harvested Endoscopically, Coronary Artery Endarterectomy;  Surgeon: Ivin Poot, MD;  Location: [place];  Service: Open Heart Surgery;  Laterality: N/A;   KNEE CARTILAGE SURGERY Left    LEFT HEART CATH AND CORONARY ANGIOGRAPHY N/A 12/21/2017   Procedure: LEFT HEART CATH AND CORONARY ANGIOGRAPHY;  Surgeon: Nelva Bush, MD;  Location: [place] [facility];  Service: Cardiovascular;  Laterality: N/A;   LITHOTRIPSY     TEE WITHOUT CARDIOVERSION N/A 01/18/2018   Procedure: TRANSESOPHAGEAL ECHOCARDIOGRAM (TEE);  Surgeon: Prescott Gum, Collier Salina, MD;  Location: [place];  Service: Open Heart Surgery;  Laterality: N/A;   UMBILICAL HERNIA REPAIR     with mesh    Prior to Admission medications   Medication Sig Start Date End Date Taking? Authorizing Provider  aspirin EC 81 MG tablet Take 81 mg by mouth daily.   Yes [provider]  Cholecalciferol (VITAMIN D3) 25 MCG (1000 UT) CAPS Take 1 capsule (1,000 Units total) by mouth daily. 05/24/20  Yes Ria Bush, MD  Cyanocobalamin (B-12) 1000 MCG SUBL Place 1 tablet under the tongue daily. 05/24/20  Yes Ria Bush, MD  fenofibrate (TRICOR) 145 MG tablet Take 1 tablet (145 mg total) by mouth daily. 06/07/21  Yes Ria Bush, MD  lisinopril (ZESTRIL) 40 MG tablet TAKE 1 TABLET BY MOUTH ONCE A DAY Patient taking differently: Take 40 mg by mouth daily. 07/18/21  Yes End, Harrell Gave, MD  metFORMIN (GLUCOPHAGE) 500 MG tablet Take 1 tablet (500 mg total) by mouth daily with breakfast. 06/07/21  Yes Ria Bush, MD  metoprolol succinate (TOPROL-XL) 25 MG 24 hr tablet TAKE 1 TABLET BY MOUTH ONCE A DAY WITH OR IMMEDIATELY FOLLOWING A MEAL Patient taking differently: Take 25 mg by mouth daily. TAKE 1 TABLET BY MOUTH ONCE A DAY WITH OR IMMEDIATELY FOLLOWING A  MEAL 09/21/20  Yes End, Harrell Gave, MD  Multiple Vitamins-Minerals (MACULAR VITAMIN BENEFIT PO) Take 1 tablet by mouth daily.   Yes [provider]  Omega-3 Fatty Acids (FISH OIL) 1200 MG CAPS Take 2 capsules (2,400 mg total) by mouth daily. 05/24/20  Yes Ria Bush, MD  omeprazole (PRILOSEC) 40 MG capsule Take 1 capsule (40 mg total) by mouth every Monday, Wednesday, and Friday. 06/08/21  Yes Ria Bush, MD  rosuvastatin (CRESTOR) 10 MG tablet TAKE 1 TABLET BY MOUTH ONCE A DAY Patient taking differently: Take 10 mg by mouth daily. 07/13/21  Yes End, Harrell Gave, MD  sitaGLIPtin (JANUVIA) 50 MG tablet Take 1 tablet (50 mg total) by mouth daily. 06/07/21  Yes Ria Bush, MD  ACCU-CHEK AVIVA PLUS test strip USE AS DIRECTED TO CHECK BLOOD SUGARS UPTO FOUR TIMES DAILY. 03/16/20   Ria Bush, MD  blood glucose meter kit and supplies  Dispense based on patient and insurance preference. Use up to four times daily as directed. (FOR ICD-10 E10.9, E11.9). 02/01/18   Angiulli, Lavon Paganini, PA-C  nitroGLYCERIN (NITROSTAT) 0.4 MG SL tablet Place 1 tablet (0.4 mg total) under the tongue every 5 (five) minutes as needed for chest pain. 03/26/20   End, Harrell Gave, MD    Current Facility-Administered Medications  Medication Dose Route Frequency Provider Last Rate Last Admin   amiodarone (NEXTERONE PREMIX) 360-4.14 MG/200ML-% (1.8 mg/mL) IV infusion  60 mg/hr Intravenous Continuous Wynona Dove A, DO 33.3 mL/hr at 10/23/21 0715 60 mg/hr at 10/23/21 0715   Followed by   amiodarone (NEXTERONE PREMIX) 360-4.14 MG/200ML-% (1.8 mg/mL) IV infusion  30 mg/hr Intravenous Continuous Wynona Dove A, DO       lactated ringers bolus 1,100 mL  1,100 mL Intravenous Once Hayden Rasmussen, MD       lactated ringers infusion   Intravenous Continuous Hayden Rasmussen, MD       morphine (PF) 4 MG/ML injection 4 mg  4 mg Intravenous Once Hayden Rasmussen, MD       piperacillin-tazobactam (ZOSYN) IVPB  3.375 g  3.375 g Intravenous Q8H Henri Medal, [facility]       Current Outpatient Medications  Medication Sig Dispense Refill   aspirin EC 81 MG tablet Take 81 mg by mouth daily.     Cholecalciferol (VITAMIN D3) 25 MCG (1000 UT) CAPS Take 1 capsule (1,000 Units total) by mouth daily. 30 capsule    Cyanocobalamin (B-12) 1000 MCG SUBL Place 1 tablet under the tongue daily.     fenofibrate (TRICOR) 145 MG tablet Take 1 tablet (145 mg total) by mouth daily. 90 tablet 3   lisinopril (ZESTRIL) 40 MG tablet TAKE 1 TABLET BY MOUTH ONCE A DAY (Patient taking differently: Take 40 mg by mouth daily.) 90 tablet 0   metFORMIN (GLUCOPHAGE) 500 MG tablet Take 1 tablet (500 mg total) by mouth daily with breakfast. 90 tablet 3   metoprolol succinate (TOPROL-XL) 25 MG 24 hr tablet TAKE 1 TABLET BY MOUTH ONCE A DAY WITH OR IMMEDIATELY FOLLOWING A MEAL (Patient taking differently: Take 25 mg by mouth daily. TAKE 1 TABLET BY MOUTH ONCE A DAY WITH OR IMMEDIATELY FOLLOWING A MEAL) 90 tablet 2   Multiple Vitamins-Minerals (MACULAR VITAMIN BENEFIT PO) Take 1 tablet by mouth daily.     Omega-3 Fatty Acids (FISH OIL) 1200 MG CAPS Take 2 capsules (2,400 mg total) by mouth daily.     omeprazole (PRILOSEC) 40 MG capsule Take 1 capsule (40 mg total) by mouth every Monday, Wednesday, and Friday. 40 capsule 3   rosuvastatin (CRESTOR) 10 MG tablet TAKE 1 TABLET BY MOUTH ONCE A DAY (Patient taking differently: Take 10 mg by mouth daily.) 90 tablet 0   sitaGLIPtin (JANUVIA) 50 MG tablet Take 1 tablet (50 mg total) by mouth daily. 90 tablet 3   ACCU-CHEK AVIVA PLUS test strip USE AS DIRECTED TO CHECK BLOOD SUGARS UPTO FOUR TIMES DAILY. 100 each 3   blood glucose meter kit and supplies Dispense based on patient and insurance preference. Use up to four times daily as directed. (FOR ICD-10 E10.9, E11.9). 1 each 0   nitroGLYCERIN (NITROSTAT) 0.4 MG SL tablet Place 1 tablet (0.4 mg total) under the tongue every 5 (five) minutes as needed for  chest pain. 25 tablet 4    Allergies as of 10/23/2021   (No Known Allergies)    Family History  Problem Relation Age of  Onset   Alzheimer's disease Mother    Breast cancer Mother        breast   Atrial fibrillation Mother    CAD Mother    Stroke Father 71   Diabetes Father    CAD Father    Colon polyps Sister    Colon polyps Brother    Rectal cancer Maternal Grandfather        rectal   Colon cancer Maternal Grandfather 63   Esophageal cancer Neg Hx    Stomach cancer Neg Hx     Social History   Socioeconomic History   Marital status: Married    Spouse name: Not on file   Number of children: Not on file   Years of education: Not on file   Highest education level: Not on file  Occupational History   Not on file  Tobacco Use   Smoking status: Never   Smokeless tobacco: Former    Types: Chew    Quit date: 09/08/2001  Vaping Use   Vaping Use: Never used  Substance and Sexual Activity   Alcohol use: Yes    Comment: occ   Drug use: No   Sexual activity: Not on file  Other Topics Concern   Not on file  Social History Narrative   Caffeine: occasional   Lives with wife, grandson ([place] 2009)   Occupation: retired, worked for state on [employer]   Activity: likes to hunt bears.   Diet: some water, fruits/vegetables daily, avoids potatoes   Social Determinants of Health   Financial Resource Strain: Not on file  Food Insecurity: Not on file  Transportation Needs: Not on file  Physical Activity: Not on file  Stress: Not on file  Social Connections: Not on file  Intimate Partner Violence: Not on file    Review of Systems: Gen: Denies fever, sweats or chills. No weight loss.  CV: Denies chest pain, palpitations or edema. Resp: Denies cough, shortness of breath of hemoptysis.  GI: See HPI GU : Denies urinary burning, blood in urine, increased urinary frequency or incontinence. MS: Denies joint pain, muscles aches or weakness. Derm: Denies rash, itchiness, skin  lesions or unhealing ulcers. Psych: Denies depression, anxiety or significant memory loss. Heme: Denies easy bruising, bleeding. Neuro:  Denies headaches, dizziness or paresthesias. Endo: + DM II.  Physical Exam: Vital signs in last 24 hours: Temp:  [98.4 F (36.9 C)] 98.4 F (36.9 C) (02/12 0338) Pulse Rate:  [102-143] 102 (02/12 0730) Resp:  [23-40] 36 (02/12 0730) BP: (118-148)/(56-127) 130/60 (02/12 0730) SpO2:  [92 %-98 %] 93 % (02/12 0730) Weight:  [104.3 kg] 104.3 kg (02/12 0340)   General: Critically ill-appearing 81 year old male alert and conversant. Head: Normocephalic and atraumatic. Eyes: Mild scleral icterus. Conjunctiva pink. Ears:  Normal auditory acuity. Nose:  No deformity, discharge or lesions. Mouth:  Dentition intact.  No dentures or bridges.  No ulcers or lesions.  Neck:  Supple. No lymphadenopathy or thyromegaly.  Lungs: Breath sounds clear throughout.  On oxygen 2 L nasal cannula. Heart: Heart rhythm mostly regular with occasional irregularity due to PVC.  No murmur. Abdomen: Soft, nondistended.  Mild generalized abdominal tenderness without rebound or guarding.  Positive bowel sounds all 4 quadrants. Rectal: Deferred. Musculoskeletal:  Symmetrical without gross deformities.  Pulses:  Normal pulses noted. Extremities:  Without clubbing or edema. Neurologic:  Alert and  oriented x 4. No focal deficits.  Skin: Moderate jaundice present. Psych:  Alert and cooperative. Normal mood and affect.  Intake/Output from previous day: 02/11 0701 - 02/12 0700 In: 718.4 [I.V.:88.4; IV Piggyback:630] Out: -  Intake/Output this shift: Total I/O In: 1050 [IV Piggyback:1050] Out: 225 [Urine:225]  Lab Results: Recent Labs    10/23/21 0334 10/23/21 0431  WBC 7.9  --   HGB 12.9* 13.6  HCT 40.6 40.0  PLT 193  --    BMET Recent Labs    10/23/21 0334 10/23/21 0431  NA 137 139  K 4.9 5.1  CL 106  --   CO2 13*  --   GLUCOSE 219*  --   BUN 24*  --    CREATININE 1.91*  --   CALCIUM 9.1  --    LFT Recent Labs    10/23/21 0334  PROT 6.1*  ALBUMIN 3.1*  AST 532*  ALT 235*  ALKPHOS 149*  BILITOT 5.9*   PT/INR Recent Labs    10/23/21 0334  LABPROT 13.7  INR 1.1    Studies/Results: CT HEAD WO CONTRAST (5MM)  Result Date: 10/23/2021 CLINICAL DATA:  Delirium EXAM: CT HEAD WITHOUT CONTRAST TECHNIQUE: Contiguous axial images were obtained from the base of the skull through the vertex without intravenous contrast. RADIATION DOSE REDUCTION: This exam was performed according to the departmental dose-optimization program which includes automated exposure control, adjustment of the mA and/or kV according to patient size and/or use of iterative reconstruction technique. COMPARISON:  None. FINDINGS: Brain: No evidence of acute infarction, hemorrhage, hydrocephalus, extra-axial collection or mass lesion/mass effect. Mild cerebral volume loss and white matter low-density in keeping with age. Vascular: No hyperdense vessel or unexpected calcification. Skull: Normal. Negative for fracture or focal lesion. Sinuses/Orbits: No acute finding. IMPRESSION: No acute finding.  Unremarkable study for age. Electronically Signed   By: Jorje Guild M.D.   On: 10/23/2021 07:15   DG Chest Portable 1 View  Result Date: 10/23/2021 CLINICAL DATA:  AFib with RVR EXAM: PORTABLE CHEST 1 VIEW COMPARISON:  03/06/2018 FINDINGS: Lungs are clear.  No pleural effusion or pneumothorax. Stable mild cardiomegaly. Postsurgical changes related to prior CABG. Median sternotomy. IMPRESSION: Stable mild cardiomegaly. No evidence of acute cardiopulmonary disease. Electronically Signed   By: Julian Hy M.D.   On: 10/23/2021 03:55   CT Angio Chest/Abd/Pel for Dissection W and/or Wo Contrast  Result Date: 10/23/2021 CLINICAL DATA:  81 year old male with history of chest and back pain. Suspected aortic dissection. EXAM: CT ANGIOGRAPHY CHEST, ABDOMEN AND PELVIS TECHNIQUE:  Non-contrast CT of the chest was initially obtained. Multidetector CT imaging through the chest, abdomen and pelvis was performed using the standard protocol during bolus administration of intravenous contrast. Multiplanar reconstructed images and MIPs were obtained and reviewed to evaluate the vascular anatomy. RADIATION DOSE REDUCTION: This exam was performed according to the departmental dose-optimization program which includes automated exposure control, adjustment of the mA and/or kV according to patient size and/or use of iterative reconstruction technique. CONTRAST:  60m OMNIPAQUE IOHEXOL 350 MG/ML SOLN COMPARISON:  Chest CT 01/17/2018. FINDINGS: CTA CHEST FINDINGS Cardiovascular: Precontrast images demonstrate no crescentic high attenuation associated with the wall of the thoracic aorta to suggest the presence of acute intramural hemorrhage. Postcontrast images demonstrate no evidence of thoracic aortic aneurysm or dissection. Heart size is normal. There is no significant pericardial fluid, thickening or pericardial calcification. There is aortic atherosclerosis, as well as atherosclerosis of the great vessels of the mediastinum and the coronary arteries, including calcified atherosclerotic plaque in the left main, left anterior descending, left circumflex and right coronary arteries. Status post median sternotomy  for CABG including LIMA to the LAD. Calcifications of the aortic valve. Mediastinum/Nodes: No pathologically enlarged mediastinal or hilar lymph nodes. Esophagus is unremarkable in appearance. No axillary lymphadenopathy. Lungs/Pleura: No acute consolidative airspace disease. No pleural effusions. No definite suspicious appearing pulmonary nodules or masses are noted on today's examination which is limited by considerable patient motion. Dependent areas of subsegmental atelectasis or scarring are noted in the lung bases bilaterally. Musculoskeletal: Median sternotomy wires. There are no aggressive  appearing lytic or blastic lesions noted in the visualized portions of the skeleton. Review of the MIP images confirms the above findings. CTA ABDOMEN AND PELVIS FINDINGS VASCULAR Aorta: Normal caliber aorta without aneurysm, dissection, vasculitis or significant stenosis. Celiac: Patent without evidence of aneurysm, dissection, vasculitis or significant stenosis. SMA: Patent without evidence of aneurysm, dissection, vasculitis or significant stenosis. Renals: Both renal arteries are patent without evidence of aneurysm, dissection, vasculitis, fibromuscular dysplasia or significant stenosis. IMA: Patent without evidence of aneurysm, dissection, vasculitis or significant stenosis. Inflow: Patent without evidence of aneurysm, dissection, vasculitis or significant stenosis. Veins: No obvious venous abnormality within the limitations of this arterial phase study. Review of the MIP images confirms the above findings. NON-VASCULAR Hepatobiliary: No suspicious cystic or solid hepatic lesions are confidently identified on today's arterial phase examination. Partially calcified gallstone measuring 1.1 cm in diameter lying dependently in the gallbladder. No findings to suggest an acute cholecystitis are noted at this time. Common bile duct measures 11 mm in the porta hepatis. No calcified stones are identified in the common bile duct. No intrahepatic biliary ductal dilatation. Pancreas: No pancreatic mass. No pancreatic ductal dilatation. No pancreatic or peripancreatic fluid collections or inflammatory changes. Spleen: Unremarkable. Adrenals/Urinary Tract: 5 mm calculus at the right ureteropelvic junction (axial image 191 of series 7). No proximal hydroureteronephrosis to indicate active obstruction at this time. There is extensive perinephric stranding bilaterally (nonspecific). Low-attenuation lesions in both kidneys, compatible with simple cysts, measuring up to 4.4 cm in the lower pole of the right kidney. Urinary  bladder is normal in appearance. Bilateral adrenal glands are normal in appearance. Stomach/Bowel: Normal appearance of the stomach. No pathologic dilatation of small bowel or colon. Numerous colonic diverticulae are noted, particularly in the sigmoid colon, without definite focal surrounding inflammatory changes to clearly indicate an associated diverticulitis at this time. The appendix is not confidently identified and may be surgically absent. Regardless, there are no inflammatory changes noted adjacent to the cecum to suggest the presence of an acute appendicitis at this time. Lymphatic: No lymphadenopathy noted in the abdomen or pelvis. Reproductive: Prostate gland and seminal vesicles are unremarkable in appearance. Other: No significant volume of ascites.  No pneumoperitoneum. Musculoskeletal: There are no aggressive appearing lytic or blastic lesions noted in the visualized portions of the skeleton. Review of the MIP images confirms the above findings. IMPRESSION: 1. No evidence of acute aortic syndrome. 2. No acute findings in the thorax to account for the patient's symptoms. 3. 5 mm calculus at the right ureteropelvic junction. At this time, there is no proximal hydronephrosis to indicate urinary tract obstruction. 4. Cholelithiasis without evidence of acute cholecystitis. 5. Dilatation of the common bile duct. No intrahepatic biliary ductal dilatation to clearly indicate biliary tract obstruction. Additionally, there is no calcified choledocholithiasis. This is of uncertain etiology and significance, and could be age related, however, correlation with liver function tests is recommended. If there is clinical concern for biliary tract obstruction, further evaluation with abdominal MRI with and without IV gadolinium with MRCP  would be recommended. 6. Aortic atherosclerosis, in addition to left main and three-vessel coronary artery disease. Status post median sternotomy for CABG including LIMA to the LAD. 7.  There are calcifications of the aortic valve. Echocardiographic correlation for evaluation of potential valvular dysfunction may be warranted if clinically indicated. 8. Additional incidental findings, as above. Electronically Signed   By: Vinnie Langton M.D.   On: 10/23/2021 07:20     IMPRESSION/PLAN:  23) 81 year old male with abdominal and back pain with elevated LFTs, lipase level and lactic acid levels. CT angiogram identified gallstones and a dilated CBD without intrahepatic biliary ductal dilatation.  Normal-appearing pancreas. Overall presentation concerning for ascending cholangitis.  Temperature 98.4.  Heart rate 103.  Blood pressure 126/59. -NPO -Continue LR 150 cc/hour -Zosyn 3.375 mg IV every 8 hours -Zofran 4 mg IV every 6 hours as needed -PPI 40 mg IV every 24 hours -Pain management per the medical service -MRI/MRCP canceled.   -Emergent ERCP with Dr. Fuller Plan benefits and risks discussed including risk with sedation, risk of bleeding, perforation and infection.  Patient is high risk for sepsis and vascular decompensation in the setting of suspected ascending cholangitis therefore the benefits of the ERCP outweigh the risks at this juncture.  2) Nonsustained VT.  Received Cardizem IV then Amiodarone IV load with continuous infusion.  3) History of paroxysmal atrial fibrillation which occurred s/p CABG surgery.  Not on anticoagulation.  4) History of CAD s/p 3 vessel CABG 01/2018. Elevated Troponin level, likely demand ischemia.  5) CKD  6) DM II   Noralyn Pick  10/23/2021, 09:06AM     Attending Physician Note   I have taken a history, reviewed the chart and examined the patient. I performed more than 50% of this encounter in conjunction with the APP. I agree with the APP's note, impression and recommendations. My additional impressions and recommendations are as follows.   Ascending cholangitis, cholelithiasis with sepsis presenting with acute abdominal, back  pain, jaundice and elevated LFTs, lipase, lactic acid levels. CT angiogram shows gallstones and dilated CBD without intrahepatic biliary ductal dilatation.  Normal appearing pancreas. Nonsustained VT treated with amiodarone. Elevated troponin likely due to demand ischemia.  Recommend emergent ERCP, possible sphincterotomy, possible stent. He is critically ill and is at very high risk for procedure, anesthesia related complications. If his cholangitis is not adequately treated he is at very high risk of further morbidity and potentially mortality so ERCP benefits outweighs the risks.     Lucio Edward, MD [facility] See AMION, [facility], for our on call provider

## 2021-10-23 NOTE — ED Notes (Signed)
Provider at bedside

## 2021-10-23 NOTE — ED Notes (Signed)
Consent: Wife of pt, Nunzio Cory gave verbal consent for ERCP  to be performed by Dr. Fuller Plan. Santiago Glad Therapist, sports to witness.

## 2021-10-23 NOTE — Op Note (Signed)
[facility] Patient Name: [name] Young Procedure Date : 10/23/2021 MRN: [id number] Attending MD: Ladene Artist , MD Date of Birth: 04-05-1941 CSN: [id number] Age: 81 Admit Type: Emergency Department Procedure:                ERCP Indications:              Abdominal pain of suspected biliary origin,                            Suspected ascending cholangitis, Jaundice, Elevated                            LFTs, Dilated CBD on CT Providers:                Pricilla Riffle. Fuller Plan, MD, [name], RN, Cherylynn Ridges, Technician, Silas Flood, CRNA Referring MD:             CCM Medicines:                General Anesthesia Complications:            No immediate complications. Estimated Blood Loss:     Estimated blood loss: none. Procedure:                Pre-Anesthesia Assessment:                           - Prior to the procedure, a History and Physical                            was performed, and patient medications and                            allergies were reviewed. The patient's tolerance of                            previous anesthesia was also reviewed. The risks                            and benefits of the procedure and the sedation                            options and risks were discussed with the patient.                            All questions were answered, and informed consent                            was obtained. Prior Anticoagulants: The patient has                            taken no previous anticoagulant or antiplatelet  agents. ASA Grade Assessment: IV - A patient with                            severe systemic disease that is a constant threat                            to life. After reviewing the risks and benefits,                            the patient was deemed in satisfactory condition to                            undergo the procedure.                           After obtaining  informed consent, the scope was                            passed under direct vision. Throughout the                            procedure, the patient's blood pressure, pulse, and                            oxygen saturations were monitored continuously. The                            [id number]-Q190V ([id number]) Olympus duodenoscope was                            introduced through the mouth, and used to inject                            contrast into and used to inject contrast into the                            bile duct. The ERCP was accomplished without                            difficulty. The patient tolerated the procedure                            well. Scope In: Scope Out: Findings:      The scout film was normal. The scope was advanced to a normal major       papilla in the descending duodenum. Limited examination of the pharynx,       larynx and associated structures, and upper GI tract was normal. A       straight Roadrunner wire was passed into the biliary tree. The       short-nosed traction sphincterotome was passed over the guidewire and       the bile duct was then deeply cannulated. Contrast was injected. I       personally interpreted the bile duct images. There was appropriate flow  of contrast through the ducts. The common bile duct contained two       stones, the largest of which was 8 mm in diameter. The common bile duct       was diffusely dilated, with stones causing an obstruction. The largest       diameter was 25mm. A 10 mm biliary sphincterotomy was made with a       traction (standard) sphincterotome using ERBE electrocautery. There was       no post-sphincterotomy bleeding. The biliary tree was swept several       times with a 12 mm balloon starting at the bifurcation. Sludge was swept       from the duct. All stones were removed. Pus drained from the CBD. Very       good biliary drainage was then noted. The PD was not cannulated or       injected by  intention. Impression:               - The common bile duct was dilated, with two stones                            causing an obstruction.                           - Choledocholithiasis was found. Complete removal                            was accomplished by biliary sphincterotomy and                            balloon extraction.                           - A biliary sphincterotomy was performed.                           - The biliary tree was swept with all stones and                            sludge removed.                           - Cholangitis. Recommendation:           - Return patient to ICU for ongoing care.                           - Avoid aspirin and nonsteroidal anti-inflammatory                            medicines for 1 week.                           - Clear liquid diet.                           - Observe patient's clinical course following  today's ERCP with therapeutic intervention.                           - Continue IV Zosyn                           - Trend LFTs. Procedure Code(s):        --- Professional ---                           [phone], Endoscopic retrograde                            cholangiopancreatography (ERCP); with removal of                            calculi/debris from biliary/pancreatic duct(s)                           43262, Endoscopic retrograde                            cholangiopancreatography (ERCP); with                            sphincterotomy/papillotomy Diagnosis Code(s):        --- Professional ---                           K80.51, Calculus of bile duct without cholangitis                            or cholecystitis with obstruction                           R10.9, Unspecified abdominal pain                           R17, Unspecified jaundice CPT copyright 2019 [employer] Association. All rights reserved. The codes documented in this report are preliminary and upon coder review may  be revised  to meet current compliance requirements. Ladene Artist, MD 10/23/2021 11:36:45 AM This report has been signed electronically. Number of Addenda: 0

## 2021-10-23 NOTE — ED Notes (Signed)
Pt transported to ct at this time 

## 2021-10-23 NOTE — Progress Notes (Signed)
Elink following for sepsis protocol. 

## 2021-10-23 NOTE — ED Notes (Signed)
Returned from ct at this time ?

## 2021-10-23 NOTE — Anesthesia Postprocedure Evaluation (Signed)
Anesthesia Post Note  Patient: [name] Young.  Procedure(s) Performed: ENDOSCOPIC RETROGRADE CHOLANGIOPANCREATOGRAPHY (ERCP) SPHINCTEROTOMY REMOVAL OF STONES     Patient location during evaluation: PACU Anesthesia Type: General Level of consciousness: sedated and patient cooperative Pain management: pain level controlled Vital Signs Assessment: post-procedure vital signs reviewed and stable Respiratory status: spontaneous breathing Cardiovascular status: stable Anesthetic complications: no   No notable events documented.  Last Vitals:  Vitals:   10/23/21 1428 10/23/21 1500  BP:    Pulse: 82 87  Resp: (!) 23 (!) 26  Temp: 36.4 C   SpO2: 94% 93%    Last Pain:  Vitals:   10/23/21 1428  TempSrc: Oral  PainSc:                  Nolon Nations

## 2021-10-23 NOTE — Progress Notes (Addendum)
[facility] Progress Note Patient Name: [name] Young. DOB: 1941-09-02 MRN: [id number]   Date of Service  10/23/2021  HPI/Events of Note  Notified that a follow up lactate had resulted and patient also with no urine output now since shift started. Last made urine during the day. Is on Amio and LR infusion. LA rising but really no symptoms and otherwise doing well from a hemodynamic standpoint. Very comfortable on camera.   eICU Interventions  Send CBC Bmp and mag. Bladder scan was done and did not show urine. Will try an albumin bolus as well.      Intervention Category Major Interventions: Acute renal failure - evaluation and management  Margaretmary Lombard 10/23/2021, 9:54 PM  Addendum at 11:30 pm I noted labs are back and spoke with RN on camera Patient with no complaints No belly pain and on exam, not tender either - observed RN palpating his belly Clarified with RN - RN got report that patient had urinated during the day shift as well but thsi was not charted Since night shift has started, he has not made urine EF 45% Not in any respiratory distress while on LR at 150 cc/hour We are trying albumin, switching fluids to bicarb drip at 100 cc/hour, repleting mag and calcium and will re check another LA at 4 am. Have asked RN to send AM labs also at the same time at 4 am and let us know when resulted  Addendum at 5:20 am Notified of labs Chemistry with low mag and calcium - replacement ordered Worsening renal function , however K and bicarb are ok  Significant transaminitis, normal Alk phos - likely from shock liver. Continue to trend Liver enzymes LA improved - repeat check at 8 am Monitor counts on CBC

## 2021-10-23 NOTE — ED Notes (Signed)
Radiology at bedside

## 2021-10-23 NOTE — H&P (Signed)
NAME:  [name] Young, [name] Young MRN:  [id number], DOB:  May 05, 1941, LOS: 0 ADMISSION DATE:  10/23/2021, CONSULTATION DATE:  10/23/2021  REFERRING MD:  Dr. Melina Copa, CHIEF COMPLAINT:  Sepsis    History of Present Illness:  [name] Young. Is a 81 y.o. male with a PMH significant for CAD s/p CAGB 2019 A-fib,, HTN. HLD, diabetes, GERD, and CKD who presented to the ED for complaints of abdominal pain with associated nausea and vomiting  that began the day prior to admission at approximately 1600.   On ED arrival patient was seen in A-fib RVR, hypertensive, and tachypnea. Labwork significant for creatinine 1.91, elevated LFTs, lipase 390, total bill 5.9, lactic acid 8.9. CT head negative but CTA chest/abd/pelvis reveled dilated bile duct, acute cholelithiasis without cholecystis. GI was consulted and recommended urgent MRCP to better characterize biliary system. Given sepsis criteria on admission PCCM was consulted for further management and admission.   Pertinent  Medical History  CAD s/p CAGB 2019 A-fib,, HTN. HLD, diabetes, GERD, and CKD  Significant Hospital Events: Including procedures, antibiotic start and stop dates in addition to other pertinent events   2/11 Admitted with sepsis felt secondary to GI source   Interim History / Subjective:  As above  Objective   Blood pressure (!) 126/59, pulse (!) 103, temperature 98.4 F (36.9 C), temperature source Oral, resp. rate (!) 28, height 5' 11"  (1.803 m), weight 104.3 kg, SpO2 94 %.        Intake/Output Summary (Last 24 hours) at 10/23/2021 0850 Last data filed at 10/23/2021 9024 Gross per 24 hour  Intake 1768.39 ml  Output 225 ml  Net 1543.39 ml   Filed Weights   10/23/21 0340  Weight: 104.3 kg    Examination: General: Acute on chronically ill appearing elderly male lying in bed, in NAD HEENT: [place]/AT, MM pink/moist, PERRL,  Neuro: Alert and oriented with some underlying confusion  CV: s1s2 regular rate and rhythm, no murmur,  rubs, or gallops,  PULM:  Clear to ascultation, no increased work of breathing, no added breath sounds GI: soft, bowel sounds active in all 4 quadrants, tender to plapation, non-distended Extremities: warm/dry, no edema  Skin: no rashes or lesions  Resolved Hospital Problem list     Assessment & Plan:  Severe sepsis -On admission patient was seen with tachycardia, tachypnea, hypertensive, with elevated WBC and lactic acid 8.9 -Source felt GI  P: Admit ICU Supplemental oxygen for sat goal > 90 Pan cultures prior to antibiotic Broad spectrum IV antibiotics with Zosyn Aggressive IV hydration provided on admit  Trend lactic acid Monitor urine output Capillary refill  Elevated LFTs and lipase with reported ABD pain  -CTA ABD reveled gallstones with dilated CBD but no acute cholecystis P: GI consulted and following, appreciated assistance  Emergent ERCP   Zosyn as above  NPO Ensure adequate pain control  Trend lipase at risk for pancreatis  Hx of CAD s/p CABG  Hx of HTN/HLD A-fib RVR versus nonsustained VT on admit  with HX of PAF P: Continue IV amio  Continuous telemetry  Optimize electrolytes  Strict intake and output  Check ECHO   AKI superimposed on CKD 3a -Baselin GFR ranges from 45-53, GFR on admit 31 P: Follow renal function  Monitor urine output Trend Bmet Avoid nephrotoxins Ensure adequate renal perfusion   Hx of diabetes P: SSI  CBG q4  CBG goal 140-180    Best Practice (right click and "Reselect all SmartList Selections" daily)  Diet/type: NPO DVT prophylaxis: prophylactic heparin  GI prophylaxis: PPI Lines: N/A Foley:  N/A Code Status:  full code Last date of multidisciplinary goals of care discussion: pending   Labs   CBC: Recent Labs  Lab 10/23/21 0334 10/23/21 0431  WBC 7.9  --   NEUTROABS 7.4  --   HGB 12.9* 13.6  HCT 40.6 40.0  MCV 93.8  --   PLT 193  --     Basic Metabolic Panel: Recent Labs  Lab 10/23/21 0334  10/23/21 0431  NA 137 139  K 4.9 5.1  CL 106  --   CO2 13*  --   GLUCOSE 219*  --   BUN 24*  --   CREATININE 1.91*  --   CALCIUM 9.1  --   MG 1.4*  --    GFR: Estimated Creatinine Clearance: 38.5 mL/min (A) (by C-G formula based on SCr of 1.91 mg/dL (H)). Recent Labs  Lab 10/23/21 0334 10/23/21 0336  WBC 7.9  --   LATICACIDVEN  --  8.9*    Liver Function Tests: Recent Labs  Lab 10/23/21 0334  AST 532*  ALT 235*  ALKPHOS 149*  BILITOT 5.9*  PROT 6.1*  ALBUMIN 3.1*   Recent Labs  Lab 10/23/21 0334  LIPASE 390*   Recent Labs  Lab 10/23/21 0401  AMMONIA 57*    ABG    Component Value Date/Time   PHART 7.422 01/22/2018 0332   PCO2ART 31.6 (L) 01/22/2018 0332   PO2ART 67.0 (L) 01/22/2018 0332   HCO3 15.2 (L) 10/23/2021 0431   TCO2 16 (L) 10/23/2021 0431   ACIDBASEDEF 8.0 (H) 10/23/2021 0431   O2SAT 87.0 10/23/2021 0431     Coagulation Profile: Recent Labs  Lab 10/23/21 0334  INR 1.1    Cardiac Enzymes: No results for input(s): CKTOTAL, CKMB, CKMBINDEX, TROPONINI in the last 168 hours.  HbA1C: Hgb A1c MFr Bld  Date/Time Value Ref Range Status  06/07/2021 12:28 PM 6.8 (H) 4.6 - 6.5 % Final    Comment:    Glycemic Control Guidelines for People with Diabetes:Non Diabetic:  <6%Goal of Therapy: <7%Additional Action Suggested:  >8%   05/20/2020 08:39 AM 6.4 4.6 - 6.5 % Final    Comment:    Glycemic Control Guidelines for People with Diabetes:Non Diabetic:  <6%Goal of Therapy: <7%Additional Action Suggested:  >8%     CBG: No results for input(s): GLUCAP in the last 168 hours.  Review of Systems:   Please see the history of present illness. All other systems reviewed and are negative   Past Medical History:  He,  has a past medical history of Arthritis, Cataract, Chronic kidney disease, Coronary artery disease, Dyspnea, Dysrhythmia, GERD (gastroesophageal reflux disease), History of cholelithiasis, History of kidney stones, History of pneumonia, HLD  (hyperlipidemia), HTN (hypertension), Jaundice, Pneumonia, and T2DM (type 2 diabetes mellitus) ([place]) (2010).   Surgical History:   Past Surgical History:  Procedure Laterality Date   CARPAL TUNNEL RELEASE Bilateral    CATARACT EXTRACTION, BILATERAL     COLONOSCOPY  11/2012   11 adenomatous polyps, diverticulosis, rec rpt 1 yr Ardis Hughs)   COLONOSCOPY  12/2013   3 polyps, diverticulosis, rec rpt 3 yrs Ardis Hughs)   COLONOSCOPY  06/2019   6 polyps (TA), diverticulosis, f/u left open ended Ardis Hughs)   CORONARY ARTERY BYPASS GRAFT N/A 01/18/2018   Procedure: CORONARY ARTERY BYPASS GRAFTING (CABG) x 3; Using Left Internal Mammary Artery, and Right Greater Saphenous Vein harvested Endoscopically, Coronary Artery Endarterectomy;  Surgeon:  Ivin Poot, MD;  Location: [facility];  Service: Open Heart Surgery;  Laterality: N/A;   KNEE CARTILAGE SURGERY Left    LEFT HEART CATH AND CORONARY ANGIOGRAPHY N/A 12/21/2017   Procedure: LEFT HEART CATH AND CORONARY ANGIOGRAPHY;  Surgeon: Nelva Bush, MD;  Location: [facility];  Service: Cardiovascular;  Laterality: N/A;   LITHOTRIPSY     TEE WITHOUT CARDIOVERSION N/A 01/18/2018   Procedure: TRANSESOPHAGEAL ECHOCARDIOGRAM (TEE);  Surgeon: Prescott Gum, Collier Salina, MD;  Location: [facility];  Service: Open Heart Surgery;  Laterality: N/A;   UMBILICAL HERNIA REPAIR     with mesh     Social History:   reports that he has never smoked. He quit smokeless tobacco use about 20 years ago.  His smokeless tobacco use included chew. He reports current alcohol use. He reports that he does not use drugs.   Family History:  His family history includes Alzheimer's disease in his mother; Atrial fibrillation in his mother; Breast cancer in his mother; CAD in his father and mother; Colon cancer (age of onset: 45) in his maternal grandfather; Colon polyps in his brother and sister; Diabetes in his father; Rectal cancer in his maternal grandfather; Stroke (age of onset: 77) in his  father. There is no history of Esophageal cancer or Stomach cancer.   Allergies No Known Allergies   Home Medications  Prior to Admission medications   Medication Sig Start Date End Date Taking? Authorizing Provider  aspirin EC 81 MG tablet Take 81 mg by mouth daily.   Yes [provider]  Cholecalciferol (VITAMIN D3) 25 MCG (1000 UT) CAPS Take 1 capsule (1,000 Units total) by mouth daily. 05/24/20  Yes Ria Bush, MD  Cyanocobalamin (B-12) 1000 MCG SUBL Place 1 tablet under the tongue daily. 05/24/20  Yes Ria Bush, MD  fenofibrate (TRICOR) 145 MG tablet Take 1 tablet (145 mg total) by mouth daily. 06/07/21  Yes Ria Bush, MD  lisinopril (ZESTRIL) 40 MG tablet TAKE 1 TABLET BY MOUTH ONCE A DAY Patient taking differently: Take 40 mg by mouth daily. 07/18/21  Yes End, Harrell Gave, MD  metFORMIN (GLUCOPHAGE) 500 MG tablet Take 1 tablet (500 mg total) by mouth daily with breakfast. 06/07/21  Yes Ria Bush, MD  metoprolol succinate (TOPROL-XL) 25 MG 24 hr tablet TAKE 1 TABLET BY MOUTH ONCE A DAY WITH OR IMMEDIATELY FOLLOWING A MEAL Patient taking differently: Take 25 mg by mouth daily. TAKE 1 TABLET BY MOUTH ONCE A DAY WITH OR IMMEDIATELY FOLLOWING A MEAL 09/21/20  Yes End, Harrell Gave, MD  Multiple Vitamins-Minerals (MACULAR VITAMIN BENEFIT PO) Take 1 tablet by mouth daily.   Yes [provider]  Omega-3 Fatty Acids (FISH OIL) 1200 MG CAPS Take 2 capsules (2,400 mg total) by mouth daily. 05/24/20  Yes Ria Bush, MD  omeprazole (PRILOSEC) 40 MG capsule Take 1 capsule (40 mg total) by mouth every Monday, Wednesday, and Friday. 06/08/21  Yes Ria Bush, MD  rosuvastatin (CRESTOR) 10 MG tablet TAKE 1 TABLET BY MOUTH ONCE A DAY Patient taking differently: Take 10 mg by mouth daily. 07/13/21  Yes End, Harrell Gave, MD  sitaGLIPtin (JANUVIA) 50 MG tablet Take 1 tablet (50 mg total) by mouth daily. 06/07/21  Yes Ria Bush, MD  ACCU-CHEK AVIVA  PLUS test strip USE AS DIRECTED TO CHECK BLOOD SUGARS UPTO FOUR TIMES DAILY. 03/16/20   Ria Bush, MD  blood glucose meter kit and supplies Dispense based on patient and insurance preference. Use up to four times daily as  directed. (FOR ICD-10 E10.9, E11.9). 02/01/18   Angiulli, Lavon Paganini, PA-C  nitroGLYCERIN (NITROSTAT) 0.4 MG SL tablet Place 1 tablet (0.4 mg total) under the tongue every 5 (five) minutes as needed for chest pain. 03/26/20   End, Harrell Gave, MD     Critical care time:    Performed by: Abdullah Rizzi D. Harris  Total critical care time: 45 minutes  Critical care time was exclusive of separately billable procedures and treating other patients.  Critical care was necessary to treat or prevent imminent or life-threatening deterioration.  Critical care was time spent personally by me on the following activities: development of treatment plan with patient and/or surrogate as well as nursing, discussions with consultants, evaluation of patient's response to treatment, examination of patient, obtaining history from patient or surrogate, ordering and performing treatments and interventions, ordering and review of laboratory studies, ordering and review of radiographic studies, pulse oximetry and re-evaluation of patient's condition.  Mariann Palo D. Kenton Kingfisher, NP-C [facility] Personal contact information can be found on Amion  10/23/2021, 10:33 AM

## 2021-10-23 NOTE — ED Notes (Signed)
Placed on 2 liters for comfort and improvement of SpO2 92%. Increased to 96%

## 2021-10-23 NOTE — ED Provider Notes (Signed)
81 year old male here with generalized abdominal pain nausea and vomiting that started yesterday.  Arrived here tachycardic and given amiodarone for nonsustained VT.  Altered on arrival.  Labs significant for greatly elevated lactate elevated white count elevated LFTs and signs of jaundice on exam.  Patient is diffusely tender abdomen although not peritoneal.  He is awake and alert.  Getting IV fluids antibiotics.  Will need admission for probable cholangitis. Physical Exam  BP (!) 124/91 (BP Location: Right Arm)    Pulse (!) 103    Temp 98.4 F (36.9 C) (Oral)    Resp (!) 29    Ht 5\' 11"  (1.803 m)    Wt 104.3 kg    SpO2 95%    BMI 32.08 kg/m   Physical Exam  Procedures  .Critical Care Performed by: Hayden Rasmussen, MD Authorized by: Hayden Rasmussen, MD   Critical care provider statement:    Critical care time (minutes):  45   Critical care time was exclusive of:  Separately billable procedures and treating other patients   Critical care was necessary to treat or prevent imminent or life-threatening deterioration of the following conditions:  Sepsis   Critical care was time spent personally by me on the following activities:  Development of treatment plan with patient or surrogate, discussions with consultants, evaluation of patient's response to treatment, examination of patient, obtaining history from patient or surrogate, ordering and performing treatments and interventions, ordering and review of laboratory studies, ordering and review of radiographic studies, pulse oximetry, re-evaluation of patient's condition and review of old charts   Care discussed with: admitting provider    ED Course / MDM   Clinical Course as of 10/23/21 0716  Sun Oct 23, 2021  0415 Called to bedside as pt HR 150-160, appears to diaphoretic, chest discomfort.  Multiple runs of monomorphic V. tach on the monitor.  Repetitive.  We will give patient amiodarone, bolus and infusion [SG]  0546 HR improved on amio  infusion, I did not observe any further runs of NSVT on tele monitoring. He is having occ PVC's.  [SG]    Clinical Course User Index [SG] Jeanell Sparrow, DO   Medical Decision Making Amount and/or Complexity of Data Reviewed Labs: ordered. Radiology: ordered.  Risk Prescription drug management. Decision regarding hospitalization.   Discussed with Dr. Amada Jupiter GI who will evaluate as a consultant.  Does recommend MRCP.  Daughter who is power of attorney updated on current status.  Her contact number is [phone], Anne Ng.   Discussed with PA Whitney from critical care.  She said she would have somebody from the team come to evaluate patient.  GI PA Berniece Pap at bedside evaluating patient.  9 AM GI informed me that they have canceled the MRCP and are proceeding with emergent ERCP with Dr. Fuller Plan.  9 AM critical care evaluated patient, will admit to their service.    Hayden Rasmussen, MD 10/23/21 [phone]

## 2021-10-23 NOTE — Transfer of Care (Signed)
Immediate Anesthesia Transfer of Care Note  Patient: [name] Young.  Procedure(s) Performed: ENDOSCOPIC RETROGRADE CHOLANGIOPANCREATOGRAPHY (ERCP)  Patient Location: Endoscopy Unit  Anesthesia Type:General  Level of Consciousness: awake, alert  and oriented  Airway & Oxygen Therapy: Patient Spontanous Breathing and Patient connected to face mask oxygen  Post-op Assessment: Report given to RN, Post -op Vital signs reviewed and stable and Patient moving all extremities  Post vital signs: Reviewed and stable  Last Vitals:  Vitals Value Taken Time  BP 101/40 10/23/21 1128  Temp 37.3 C 10/23/21 1126  Pulse 75 10/23/21 1131  Resp 23 10/23/21 1131  SpO2 95 % 10/23/21 1131  Vitals shown include unvalidated device data.  Last Pain:  Vitals:   10/23/21 1126  TempSrc: Temporal  PainSc:          Complications: No notable events documented.

## 2021-10-23 NOTE — ED Triage Notes (Signed)
BIB [facility] from home. C/o abd pain started at 4pm yesterday. On EMS arrival pt was warm to the touch, tachypneic, slurred speech, HTN A&OX4. Pt was found to be in A-fib with RVR. PIV 18ga lt fa, Cardizem 10mg , NS 514ml bolus, Torreon 2lpm

## 2021-10-23 NOTE — Progress Notes (Signed)
Pharmacy Antibiotic Note  [name] Young. is a 81 y.o. male admitted on 10/23/2021 with intra-abdominal infection concern.  Pharmacy has been consulted for Zosyn dosing.  Plan: Zosyn 3.375g (30 minute infusion) followed by Zosyn 3.375g q8h (extended infusion) Monitor renal function, cultures, and clinical progression  Height: 5\' 11"  (180.3 cm) Weight: 104.3 kg (230 lb) IBW/kg (Calculated) : 75.3  Temp (24hrs), Avg:98.4 F (36.9 C), Min:98.4 F (36.9 C), Max:98.4 F (36.9 C)  Recent Labs  Lab 10/23/21 0334 10/23/21 0336  WBC 7.9  --   CREATININE 1.91*  --   LATICACIDVEN  --  8.9*    Estimated Creatinine Clearance: 38.5 mL/min (A) (by C-G formula based on SCr of 1.91 mg/dL (H)).    No Known Allergies  Antimicrobials this admission: Zosyn 2/12 >>  Dose adjustments this admission:  Microbiology results: 2/12 bcx:  Thank you for allowing pharmacy to be a part of this patients care.  Cristela Felt, PharmD, BCPS Clinical Pharmacist 10/23/2021 7:14 AM

## 2021-10-23 NOTE — ED Provider Notes (Signed)
[facility] EMERGENCY DEPARTMENT Provider Note   CSN: [id number] Arrival date & time: 10/23/21  5400     History  Chief Complaint  Patient presents with   Abdominal Pain    Antonie Borjon. is a 81 y.o. male.  This is a 81 y.o. male  with significant medical history as below, including CAD s/p CABG 2019, afib, HLD, HTN, T2DM, CKD who presents to the ED with complaint of abdominal pain, n/v, ha, elevated BP.  On EMS arrival patient was hypertensive, tachycardic.  He was given Cardizem by EMS as concern for afib with RVR- which did improve his blood pressure and heart rate.  I reviewed rhythm strip and does not appear to show afib. Patient complaining of abdominal discomfort, generalized, periumbilical region.  Reports pain began after eating dinner, a/w nausea and vomiting.  No change in bowel or bladder function.  No fevers.  No chest pain or dyspnea.  No recent travel or sick contacts.  No trauma.  EMS reports there was initially some concern for dysarthria by family however this seems to have resolved.  EMS report stroke scale was negative on their arrival.   D/w family: Gervasi,[name]     [phone]  Around 1600 pt with abdominal pain, back pain. Took pepto/tums w/o relief. Emesis x1 and felt a little better. Unable to get up out of bed, wife helped him. Unable to get him out of bed again so she called EMS. Ongoing chills last 24 hrs. Wife denies ANY speech difficulty, NO slurred speech as was reported by EMS, NO behavior changes, NO word finding difficulties, NO numbness or tingling.    Past Medical History: No date: Arthritis No date: Cataract     Comment:  lens implant bilateral No date: Chronic kidney disease     Comment:  kidney stones No date: Coronary artery disease No date: Dyspnea No date: Dysrhythmia No date: GERD (gastroesophageal reflux disease) No date: History of cholelithiasis No date: History of kidney stones     Comment:  ca  [name] [facility] now [employer] No date: History of pneumonia No date: HLD (hyperlipidemia) No date: HTN (hypertension) No date: Jaundice     Comment:  age 23 No date: Pneumonia     Comment:  years ago  2010: T2DM (type 2 diabetes mellitus) ([place])  Past Surgical History: No date: CARPAL TUNNEL RELEASE; Bilateral No date: CATARACT EXTRACTION, BILATERAL 11/2012: COLONOSCOPY     Comment:  11 adenomatous polyps, diverticulosis, rec rpt 1 yr               Ardis Hughs) 12/2013: COLONOSCOPY     Comment:  3 polyps, diverticulosis, rec rpt 3 yrs Ardis Hughs) 06/2019: COLONOSCOPY     Comment:  6 polyps (TA), diverticulosis, f/u left open ended               Ardis Hughs) 01/18/2018: CORONARY ARTERY BYPASS GRAFT; N/A     Comment:  Procedure: CORONARY ARTERY BYPASS GRAFTING (CABG) x 3;               Using Left Internal Mammary Artery, and Right Greater               Saphenous Vein harvested Endoscopically, Coronary Artery               Endarterectomy;  Surgeon: Ivin Poot, MD;                Location: [place];  Service: Open Heart  Surgery;                Laterality: N/A; No date: KNEE CARTILAGE SURGERY; Left 12/21/2017: LEFT HEART CATH AND CORONARY ANGIOGRAPHY; N/A     Comment:  Procedure: LEFT HEART CATH AND CORONARY ANGIOGRAPHY;                Surgeon: Nelva Bush, MD;  Location: [place] [facility]               [facility];  Service: Cardiovascular;  Laterality: N/A; No date: LITHOTRIPSY 01/18/2018: TEE WITHOUT CARDIOVERSION; N/A     Comment:  Procedure: TRANSESOPHAGEAL ECHOCARDIOGRAM (TEE);                Surgeon: Prescott Gum, Collier Salina, MD;  Location: [place];                Service: Open Heart Surgery;  Laterality: N/A; No date: UMBILICAL HERNIA REPAIR     Comment:  with mesh    The history is provided by the patient and the EMS personnel. No language interpreter was used.  Abdominal Pain Associated symptoms: chills, cough, nausea and vomiting   Associated symptoms: no chest pain, no fever, no  hematuria and no shortness of breath       Home Medications Prior to Admission medications   Medication Sig Start Date End Date Taking? Authorizing Provider  aspirin EC 81 MG tablet Take 81 mg by mouth daily.   Yes [provider]  Cholecalciferol (VITAMIN D3) 25 MCG (1000 UT) CAPS Take 1 capsule (1,000 Units total) by mouth daily. 05/24/20  Yes Ria Bush, MD  Cyanocobalamin (B-12) 1000 MCG SUBL Place 1 tablet under the tongue daily. 05/24/20  Yes Ria Bush, MD  fenofibrate (TRICOR) 145 MG tablet Take 1 tablet (145 mg total) by mouth daily. 06/07/21  Yes Ria Bush, MD  lisinopril (ZESTRIL) 40 MG tablet TAKE 1 TABLET BY MOUTH ONCE A DAY Patient taking differently: Take 40 mg by mouth daily. 07/18/21  Yes End, Harrell Gave, MD  metFORMIN (GLUCOPHAGE) 500 MG tablet Take 1 tablet (500 mg total) by mouth daily with breakfast. 06/07/21  Yes Ria Bush, MD  metoprolol succinate (TOPROL-XL) 25 MG 24 hr tablet TAKE 1 TABLET BY MOUTH ONCE A DAY WITH OR IMMEDIATELY FOLLOWING A MEAL Patient taking differently: Take 25 mg by mouth daily. TAKE 1 TABLET BY MOUTH ONCE A DAY WITH OR IMMEDIATELY FOLLOWING A MEAL 09/21/20  Yes End, Harrell Gave, MD  Multiple Vitamins-Minerals (MACULAR VITAMIN BENEFIT PO) Take 1 tablet by mouth daily.   Yes [provider]  Omega-3 Fatty Acids (FISH OIL) 1200 MG CAPS Take 2 capsules (2,400 mg total) by mouth daily. 05/24/20  Yes Ria Bush, MD  omeprazole (PRILOSEC) 40 MG capsule Take 1 capsule (40 mg total) by mouth every Monday, Wednesday, and Friday. 06/08/21  Yes Ria Bush, MD  rosuvastatin (CRESTOR) 10 MG tablet TAKE 1 TABLET BY MOUTH ONCE A DAY Patient taking differently: Take 10 mg by mouth daily. 07/13/21  Yes End, Harrell Gave, MD  sitaGLIPtin (JANUVIA) 50 MG tablet Take 1 tablet (50 mg total) by mouth daily. 06/07/21  Yes Ria Bush, MD  ACCU-CHEK AVIVA PLUS test strip USE AS DIRECTED TO CHECK BLOOD SUGARS UPTO  FOUR TIMES DAILY. 03/16/20   Ria Bush, MD  blood glucose meter kit and supplies Dispense based on patient and insurance preference. Use up to four times daily as directed. (FOR ICD-10 E10.9, E11.9). 02/01/18   Angiulli, Lavon Paganini, PA-C  nitroGLYCERIN (NITROSTAT)  0.4 MG SL tablet Place 1 tablet (0.4 mg total) under the tongue every 5 (five) minutes as needed for chest pain. 03/26/20   End, Harrell Gave, MD      Allergies    Patient has no known allergies.    Review of Systems   Review of Systems  Constitutional:  Positive for chills. Negative for fever.  HENT:  Negative for facial swelling and trouble swallowing.   Eyes:  Negative for photophobia and visual disturbance.  Respiratory:  Positive for cough. Negative for shortness of breath.   Cardiovascular:  Negative for chest pain and palpitations.  Gastrointestinal:  Positive for abdominal pain, nausea and vomiting.  Endocrine: Negative for polydipsia and polyuria.  Genitourinary:  Negative for difficulty urinating and hematuria.  Musculoskeletal:  Positive for arthralgias and back pain. Negative for gait problem, joint swelling and neck pain.  Skin:  Negative for pallor and rash.  Neurological:  Negative for syncope, facial asymmetry, numbness and headaches.  Psychiatric/Behavioral:  Negative for agitation and confusion.    Physical Exam Updated Vital Signs BP (!) 124/91 (BP Location: Right Arm)    Pulse (!) 103    Temp 98.4 F (36.9 C) (Oral)    Resp (!) 29    Ht 5' 11"  (1.803 m)    Wt 104.3 kg    SpO2 95%    BMI 32.08 kg/m  Physical Exam Vitals and nursing note reviewed.  Constitutional:      General: He is not in acute distress.    Appearance: He is well-developed. He is not ill-appearing.  HENT:     Head: Normocephalic and atraumatic.     Right Ear: External ear normal.     Left Ear: External ear normal.     Mouth/Throat:     Mouth: Mucous membranes are moist.  Eyes:     General: Scleral icterus present.      Extraocular Movements: Extraocular movements intact.     Pupils: Pupils are equal, round, and reactive to light.  Cardiovascular:     Rate and Rhythm: Regular rhythm. Tachycardia present.     Pulses: Normal pulses.          Radial pulses are 2+ on the right side and 2+ on the left side.       Dorsalis pedis pulses are 2+ on the right side and 2+ on the left side.     Heart sounds: Normal heart sounds.  Pulmonary:     Effort: Pulmonary effort is normal. No respiratory distress.     Breath sounds: Normal breath sounds.  Chest:    Abdominal:     General: Abdomen is flat.     Palpations: Abdomen is soft.     Tenderness: There is generalized abdominal tenderness and tenderness in the epigastric area.  Musculoskeletal:        General: Normal range of motion.     Cervical back: Normal range of motion.     Right lower leg: No edema.     Left lower leg: No edema.  Skin:    General: Skin is warm and dry.     Capillary Refill: Capillary refill takes less than 2 seconds.     Coloration: Skin is jaundiced.  Neurological:     Mental Status: He is alert and oriented to person, place, and time. He is confused.     GCS: GCS eye subscore is 4. GCS verbal subscore is 5. GCS motor subscore is 6.     Cranial Nerves: Cranial nerves  2-12 are intact. No dysarthria or facial asymmetry.     Sensory: Sensation is intact.     Motor: Motor function is intact. No tremor or pronator drift.     Coordination: Coordination is intact.     Comments: Intermittently confused   Psychiatric:        Mood and Affect: Mood normal.        Behavior: Behavior normal.    ED Results / Procedures / Treatments   Labs (all labs ordered are listed, but only abnormal results are displayed) Labs Reviewed  CBC WITH DIFFERENTIAL/PLATELET - Abnormal; Notable for the following components:      Result Value   Hemoglobin 12.9 (*)    Lymphs Abs 0.3 (*)    All other components within normal limits  COMPREHENSIVE METABOLIC PANEL  - Abnormal; Notable for the following components:   CO2 13 (*)    Glucose, Bld 219 (*)    BUN 24 (*)    Creatinine, Ser 1.91 (*)    Total Protein 6.1 (*)    Albumin 3.1 (*)    AST 532 (*)    ALT 235 (*)    Alkaline Phosphatase 149 (*)    Total Bilirubin 5.9 (*)    GFR, Estimated 35 (*)    Anion gap 18 (*)    All other components within normal limits  LIPASE, BLOOD - Abnormal; Notable for the following components:   Lipase 390 (*)    All other components within normal limits  MAGNESIUM - Abnormal; Notable for the following components:   Magnesium 1.4 (*)    All other components within normal limits  LACTIC ACID, PLASMA - Abnormal; Notable for the following components:   Lactic Acid, Venous 8.9 (*)    All other components within normal limits  AMMONIA - Abnormal; Notable for the following components:   Ammonia 57 (*)    All other components within normal limits  I-STAT VENOUS BLOOD GAS, ED - Abnormal; Notable for the following components:   pCO2, Ven 23.9 (*)    pO2, Ven 51.0 (*)    Bicarbonate 15.2 (*)    TCO2 16 (*)    Acid-base deficit 8.0 (*)    Calcium, Ion 1.10 (*)    All other components within normal limits  TROPONIN I (HIGH SENSITIVITY) - Abnormal; Notable for the following components:   Troponin I (High Sensitivity) 68 (*)    All other components within normal limits  TROPONIN I (HIGH SENSITIVITY) - Abnormal; Notable for the following components:   Troponin I (High Sensitivity) 142 (*)    All other components within normal limits  RESP PANEL BY RT-PCR (FLU A&B, COVID) ARPGX2  CULTURE, BLOOD (ROUTINE X 2)  CULTURE, BLOOD (ROUTINE X 2)  PROTIME-INR  URINALYSIS, ROUTINE W REFLEX MICROSCOPIC  BLOOD GAS, VENOUS  LACTIC ACID, PLASMA    EKG EKG Interpretation  Date/Time:  Sunday October 23 2021 03:53:20 EST Ventricular Rate:  121 PR Interval:  80 QRS Duration: 88 QT Interval:  321 QTC Calculation: 456 R Axis:   44 Text Interpretation: Sinus or ectopic  atrial tachycardia NSVT Aberrant complex Repol abnrm suggests ischemia, diffuse leads Confirmed by Wynona Dove ([id number]) on 10/23/2021 4:00:12 AM  Radiology CT HEAD WO CONTRAST (5MM)  Result Date: 10/23/2021 CLINICAL DATA:  Delirium EXAM: CT HEAD WITHOUT CONTRAST TECHNIQUE: Contiguous axial images were obtained from the base of the skull through the vertex without intravenous contrast. RADIATION DOSE REDUCTION: This exam was performed according to the departmental dose-optimization  program which includes automated exposure control, adjustment of the mA and/or kV according to patient size and/or use of iterative reconstruction technique. COMPARISON:  None. FINDINGS: Brain: No evidence of acute infarction, hemorrhage, hydrocephalus, extra-axial collection or mass lesion/mass effect. Mild cerebral volume loss and white matter low-density in keeping with age. Vascular: No hyperdense vessel or unexpected calcification. Skull: Normal. Negative for fracture or focal lesion. Sinuses/Orbits: No acute finding. IMPRESSION: No acute finding.  Unremarkable study for age. Electronically Signed   By: Jorje Guild M.D.   On: 10/23/2021 07:15   DG Chest Portable 1 View  Result Date: 10/23/2021 CLINICAL DATA:  AFib with RVR EXAM: PORTABLE CHEST 1 VIEW COMPARISON:  03/06/2018 FINDINGS: Lungs are clear.  No pleural effusion or pneumothorax. Stable mild cardiomegaly. Postsurgical changes related to prior CABG. Median sternotomy. IMPRESSION: Stable mild cardiomegaly. No evidence of acute cardiopulmonary disease. Electronically Signed   By: Julian Hy M.D.   On: 10/23/2021 03:55    Procedures .Critical Care Performed by: Jeanell Sparrow, DO Authorized by: Jeanell Sparrow, DO   Critical care provider statement:    Critical care time (minutes):  75   Critical care time was exclusive of:  Separately billable procedures and treating other patients   Critical care was necessary to treat or prevent imminent or  life-threatening deterioration of the following conditions:  Hepatic failure and cardiac failure   Critical care was time spent personally by me on the following activities:  Development of treatment plan with patient or surrogate, discussions with consultants, evaluation of patient's response to treatment, examination of patient, ordering and review of laboratory studies, ordering and review of radiographic studies, ordering and performing treatments and interventions, pulse oximetry, re-evaluation of patient's condition, review of old charts and obtaining history from patient or surrogate    Medications Ordered in ED Medications  amiodarone (NEXTERONE) 1.8 mg/mL load via infusion 150 mg (150 mg Intravenous Bolus from Bag 10/23/21 0435)    Followed by  amiodarone (NEXTERONE PREMIX) 360-4.14 MG/200ML-% (1.8 mg/mL) IV infusion (60 mg/hr Intravenous Rate/Dose Verify 10/23/21 0715)    Followed by  amiodarone (NEXTERONE PREMIX) 360-4.14 MG/200ML-% (1.8 mg/mL) IV infusion (has no administration in time range)  piperacillin-tazobactam (ZOSYN) IVPB 3.375 g (has no administration in time range)  piperacillin-tazobactam (ZOSYN) IVPB 3.375 g (has no administration in time range)  ondansetron (ZOFRAN) injection 4 mg (4 mg Intravenous Given 10/23/21 0412)  famotidine (PEPCID) IVPB 20 mg premix (0 mg Intravenous Stopped 10/23/21 0435)  lactated ringers bolus 1,000 mL (0 mLs Intravenous Stopped 10/23/21 0613)  magnesium sulfate IVPB 2 g 50 mL (2 g Intravenous New Bag/Given 10/23/21 0623)  lactated ringers bolus 1,000 mL (1,000 mLs Intravenous New Bag/Given 10/23/21 0620)  iohexol (OMNIPAQUE) 350 MG/ML injection 75 mL (75 mLs Intravenous Contrast Given 10/23/21 0657)    ED Course/ Medical Decision Making/ A&P Clinical Course as of 10/23/21 0734  Sun Oct 23, 2021  0415 Called to bedside as pt HR 150-160, appears to diaphoretic, chest discomfort.  Multiple runs of monomorphic V. tach on the monitor.  Repetitive.  We  will give patient amiodarone, bolus and infusion [SG]  0546 HR improved on amio infusion, I did not observe any further runs of NSVT on tele monitoring. He is having occ PVC's.  [SG]    Clinical Course User Index [SG] Jeanell Sparrow, DO  Medical Decision Making Amount and/or Complexity of Data Reviewed Independent Historian: spouse    Details: daughter, spouse External Data Reviewed: labs, radiology, ECG and notes. Labs: ordered. Decision-making details documented in ED Course. Radiology: ordered. Decision-making details documented in ED Course. ECG/medicine tests: ordered and independent interpretation performed. Decision-making details documented in ED Course.  Risk OTC drugs. Prescription drug management. Decision regarding hospitalization.    CC: abd pain, n/v, chills, malaise, tachycardia  This patient presents to the Emergency Department for the above complaint. This involves an extensive number of treatment options and is a complaint that carries with it a high risk of complications and morbidity. Vital signs were reviewed. Serious etiologies considered.  Record review:  Previous records obtained and reviewed  Additional history obtained from spouse, daughter  Medical and surgical history as noted above.   Work up as above, notable for:  Labs & imaging results that were available during my care of the patient were reviewed by me and considered in my medical decision making.   I ordered imaging studies which included CTH, CT A/P CXR and I independently visualized and interpreted imaging which showed chest x-ray stable, CT head stable.  CT abdomen pelvis pending at time of signout.  Cardiac monitoring reviewed and interpreted personally which shows patient multiple episodes of monomorphic recurrent ventricular tachycardia.  Currently amiodarone sinus tachycardia, occasional PVCs.  Heart rate greatly improved after amiodarone.  Social determinants  of health include - N/a  Management: IV abx (zosyn), IVF, gi cocktail.   Reassessment:  Patient with profound lactic acidosis.  Tachycardia.  Tachypnea.  Chills.  No fever.  Patient with likely infection, unclear source at this time.  Presumed to be intra-abdominal given diffuse abdominal discomfort.  Blood cultures obtained.  Broad-spectrum antibiotics started following blood culture.  Patient found to have NSTEMI.  EKG with sinus tachycardia.  No chest pain.  Pending CT abdomen imaging at this time.  Care this is likely demand ischemia in setting of intra-abdominal pathology, possibly cholangitis, tachycardia.  Patient is NPO.  May require surgical intervention.  Hold off on heparin at this time.  Liver enzymes acutely elevated with AST 532, ALT 235, alk phos elevated 149.  T. bili elevated 5.9.  Lipase also elevated, 390. jaundice, diffuse abdominal discomfort.  High suspicion for cholangitis versus choledocholithiasis, ?  Cholecystitis.  Patient will require admission.  Patient pending CT imaging of the abdomen pelvis at time of handoff.  Handoff to incoming physician at this time. Pt is HDS, protecting his airway. Will require admission for the above. Pt/family agreeable.   This chart was dictated using voice recognition software.  Despite best efforts to proofread,  errors can occur which can change the documentation meaning.         Final Clinical Impression(s) / ED Diagnoses Final diagnoses:  NSVT (nonsustained ventricular tachycardia)  Transaminitis  Hyperbilirubinemia  AKI (acute kidney injury) ([place])  NSTEMI (non-ST elevated myocardial infarction) ([place])  Hypomagnesemia    Rx / DC Orders ED Discharge Orders     None         Jeanell Sparrow, DO 10/23/21 [phone]

## 2021-10-23 NOTE — Progress Notes (Addendum)
PHARMACY - PHYSICIAN COMMUNICATION CRITICAL VALUE ALERT - BLOOD CULTURE IDENTIFICATION (BCID)  [name] Young. is an 81 y.o. male who presented to [facility] on 10/23/2021 with a chief complaint of abdominal pain.  Assessment:  Patient noted to be tachycardic with nonsustained VT and altered in the ED. Some jaundice noted per EDP note as well. Patient started on  zosyn for suspected intra-abdominal infection.   Name of physician (or Provider) Contacted: Agarwala  Current antibiotics: Zosyn 3.375g q8h  Changes to prescribed antibiotics recommended:  Have recommended de-escalation to ceftriaxone 2g q24hr to provider at this time, and I am awaiting a response. Patient is currently being covered for organisms identified on BCID, but is broader coverage than is required per BCID findings. If intra-abdominal coverage (anaerobic coverage) is still desired by treatment team, rocephin alone would not cover this.  Results for orders placed or performed during the hospital encounter of 10/23/21  Blood Culture ID Panel (Reflexed) (Collected: 10/23/2021  4:15 AM)  Result Value Ref Range   Enterococcus faecalis NOT DETECTED NOT DETECTED   Enterococcus Faecium NOT DETECTED NOT DETECTED   Listeria monocytogenes NOT DETECTED NOT DETECTED   Staphylococcus species NOT DETECTED NOT DETECTED   Staphylococcus aureus (BCID) NOT DETECTED NOT DETECTED   Staphylococcus epidermidis NOT DETECTED NOT DETECTED   Staphylococcus lugdunensis NOT DETECTED NOT DETECTED   Streptococcus species DETECTED (A) NOT DETECTED   Streptococcus agalactiae NOT DETECTED NOT DETECTED   Streptococcus pneumoniae NOT DETECTED NOT DETECTED   Streptococcus pyogenes NOT DETECTED NOT DETECTED   A.calcoaceticus-baumannii NOT DETECTED NOT DETECTED   Bacteroides fragilis NOT DETECTED NOT DETECTED   Enterobacterales DETECTED (A) NOT DETECTED   Enterobacter cloacae complex NOT DETECTED NOT DETECTED   Escherichia coli DETECTED (A) NOT  DETECTED   Klebsiella aerogenes NOT DETECTED NOT DETECTED   Klebsiella oxytoca NOT DETECTED NOT DETECTED   Klebsiella pneumoniae NOT DETECTED NOT DETECTED   Proteus species NOT DETECTED NOT DETECTED   Salmonella species NOT DETECTED NOT DETECTED   Serratia marcescens NOT DETECTED NOT DETECTED   Haemophilus influenzae NOT DETECTED NOT DETECTED   Neisseria meningitidis NOT DETECTED NOT DETECTED   Pseudomonas aeruginosa NOT DETECTED NOT DETECTED   Stenotrophomonas maltophilia NOT DETECTED NOT DETECTED   Candida albicans NOT DETECTED NOT DETECTED   Candida auris NOT DETECTED NOT DETECTED   Candida glabrata NOT DETECTED NOT DETECTED   Candida krusei NOT DETECTED NOT DETECTED   Candida parapsilosis NOT DETECTED NOT DETECTED   Candida tropicalis NOT DETECTED NOT DETECTED   Cryptococcus neoformans/gattii NOT DETECTED NOT DETECTED   CTX-M ESBL NOT DETECTED NOT DETECTED   Carbapenem resistance IMP NOT DETECTED NOT DETECTED   Carbapenem resistance KPC NOT DETECTED NOT DETECTED   Carbapenem resistance NDM NOT DETECTED NOT DETECTED   Carbapenem resist OXA 48 LIKE NOT DETECTED NOT DETECTED   Carbapenem resistance VIM NOT DETECTED NOT DETECTED    Lorelei Pont, PharmD, BCPS 10/23/2021 1:56 PM ED Clinical Pharmacist -  [phone]

## 2021-10-23 NOTE — ED Notes (Signed)
Made provider aware of current heart rate and rhythm. Provider at beside.

## 2021-10-23 NOTE — ED Notes (Signed)
Pt assisted to bedside commode because he stated he needed to have a bm. Pt was placed back in bed but didn't have a bm.

## 2021-10-23 NOTE — Anesthesia Procedure Notes (Signed)
Procedure Name: Intubation Date/Time: 10/23/2021 10:43 AM Performed by: Amadeo Garnet, CRNA Pre-anesthesia Checklist: Patient identified, Emergency Drugs available, Suction available and Patient being monitored Patient Re-evaluated:Patient Re-evaluated prior to induction Oxygen Delivery Method: Circle system utilized Preoxygenation: Pre-oxygenation with 100% oxygen Induction Type: IV induction and Rapid sequence Laryngoscope Size: Mac and 4 Grade View: Grade I Tube type: Oral Tube size: 7.5 mm Number of attempts: 2 Airway Equipment and Method: Stylet and Oral airway Placement Confirmation: ETT inserted through vocal cords under direct vision, positive ETCO2 and breath sounds checked- equal and bilateral Secured at: 23 cm Tube secured with: Tape Dental Injury: Teeth and Oropharynx as per pre-operative assessment

## 2021-10-23 NOTE — Anesthesia Preprocedure Evaluation (Addendum)
Anesthesia Evaluation  Patient identified by MRN, date of birth, ID band Patient confused    Reviewed: Allergy & Precautions, NPO status , Patient's Chart, lab work & pertinent test results, reviewed documented beta blocker date and time   Airway Mallampati: II  TM Distance: >3 FB Neck ROM: Full    Dental  (+) Dental Advisory Given, Teeth Intact   Pulmonary shortness of breath, pneumonia,    Pulmonary exam normal breath sounds clear to auscultation       Cardiovascular hypertension, Pt. on medications and Pt. on home beta blockers + angina + CAD and + CABG  Normal cardiovascular exam+ dysrhythmias  Rhythm:Regular Rate:Normal  Echo 2019 Left Ventricle LV systolic function is low normal with an EF of 50-55%. Septum Normal atrial septum with no atrial septal defect and normal septal motion. No Patent Foramen Ovale present. Aortic Valve Normal valve structure. Mitral Valve Normal valve structure. Mild regurgitation. Right Ventricle Normal cavity size. Tricuspid Valve Normal tricuspid valve structure.     Neuro/Psych  Neuromuscular disease    GI/Hepatic Neg liver ROS, GERD  ,  Endo/Other  diabetes  Renal/GU Renal disease     Musculoskeletal  (+) Arthritis ,   Abdominal (+) + obese,   Peds  Hematology  (+) Blood dyscrasia, anemia ,   Anesthesia Other Findings   Reproductive/Obstetrics                           Anesthesia Physical  Anesthesia Plan  ASA: 4 and emergent  Anesthesia Plan: General   Post-op Pain Management: Minimal or no pain anticipated   Induction: Intravenous, Rapid sequence and Cricoid pressure planned  PONV Risk Score and Plan: 2 and Treatment may vary due to age or medical condition, Ondansetron and Dexamethasone  Airway Management Planned: Oral ETT  Additional Equipment:   Intra-op Plan:   Post-operative Plan: Possible Post-op intubation/ventilation  Informed  Consent: I have reviewed the patients History and Physical, chart, labs and discussed the procedure including the risks, benefits and alternatives for the proposed anesthesia with the patient or authorized representative who has indicated his/her understanding and acceptance.     Dental advisory given and Consent reviewed with POA  Plan Discussed with: CRNA  Anesthesia Plan Comments:       Anesthesia Quick Evaluation

## 2021-10-24 DIAGNOSIS — R6520 Severe sepsis without septic shock: Secondary | ICD-10-CM

## 2021-10-24 DIAGNOSIS — B955 Unspecified streptococcus as the cause of diseases classified elsewhere: Secondary | ICD-10-CM

## 2021-10-24 DIAGNOSIS — R7881 Bacteremia: Secondary | ICD-10-CM | POA: Diagnosis not present

## 2021-10-24 DIAGNOSIS — N179 Acute kidney failure, unspecified: Secondary | ICD-10-CM | POA: Diagnosis not present

## 2021-10-24 DIAGNOSIS — A419 Sepsis, unspecified organism: Secondary | ICD-10-CM | POA: Diagnosis not present

## 2021-10-24 DIAGNOSIS — N170 Acute kidney failure with tubular necrosis: Secondary | ICD-10-CM

## 2021-10-24 DIAGNOSIS — K8309 Other cholangitis: Secondary | ICD-10-CM

## 2021-10-24 DIAGNOSIS — K8033 Calculus of bile duct with acute cholangitis with obstruction: Secondary | ICD-10-CM | POA: Diagnosis not present

## 2021-10-24 DIAGNOSIS — I10 Essential (primary) hypertension: Secondary | ICD-10-CM

## 2021-10-24 DIAGNOSIS — B9620 Unspecified Escherichia coli [E. coli] as the cause of diseases classified elsewhere: Secondary | ICD-10-CM

## 2021-10-24 DIAGNOSIS — I5021 Acute systolic (congestive) heart failure: Secondary | ICD-10-CM

## 2021-10-24 DIAGNOSIS — A4189 Other specified sepsis: Secondary | ICD-10-CM

## 2021-10-24 DIAGNOSIS — E785 Hyperlipidemia, unspecified: Secondary | ICD-10-CM

## 2021-10-24 DIAGNOSIS — R7989 Other specified abnormal findings of blood chemistry: Secondary | ICD-10-CM

## 2021-10-24 HISTORY — DX: Unspecified streptococcus as the cause of diseases classified elsewhere: B95.5

## 2021-10-24 LAB — COMPREHENSIVE METABOLIC PANEL
ALT: 849 U/L — ABNORMAL HIGH (ref 0–44)
AST: 1557 U/L — ABNORMAL HIGH (ref 15–41)
Albumin: 2.3 g/dL — ABNORMAL LOW (ref 3.5–5.0)
Alkaline Phosphatase: 106 U/L (ref 38–126)
Anion gap: 16 — ABNORMAL HIGH (ref 5–15)
BUN: 37 mg/dL — ABNORMAL HIGH (ref 8–23)
CO2: 23 mmol/L (ref 22–32)
Calcium: 7.1 mg/dL — ABNORMAL LOW (ref 8.9–10.3)
Chloride: 99 mmol/L (ref 98–111)
Creatinine, Ser: 3.19 mg/dL — ABNORMAL HIGH (ref 0.61–1.24)
GFR, Estimated: 19 mL/min — ABNORMAL LOW (ref >60)
Glucose, Bld: 374 mg/dL — ABNORMAL HIGH (ref 70–99)
Potassium: 3.7 mmol/L (ref 3.5–5.1)
Sodium: 138 mmol/L (ref 135–145)
Total Bilirubin: 3.7 mg/dL — ABNORMAL HIGH (ref 0.3–1.2)
Total Protein: 4.8 g/dL — ABNORMAL LOW (ref 6.5–8.1)

## 2021-10-24 LAB — POCT I-STAT 7, (LYTES, BLD GAS, ICA,H+H)
Acid-base deficit: 3 mmol/L — ABNORMAL HIGH (ref 0.0–2.0)
Bicarbonate: 20.4 mmol/L (ref 20.0–28.0)
Calcium, Ion: 1.09 mmol/L — ABNORMAL LOW (ref 1.15–1.40)
HCT: 32 % — ABNORMAL LOW (ref 39.0–52.0)
Hemoglobin: 10.9 g/dL — ABNORMAL LOW (ref 13.0–17.0)
O2 Saturation: 95 %
Patient temperature: 98.9
Potassium: 4.2 mmol/L (ref 3.5–5.1)
Sodium: 137 mmol/L (ref 135–145)
TCO2: 21 mmol/L — ABNORMAL LOW (ref 22–32)
pCO2 arterial: 30.3 mmHg — ABNORMAL LOW (ref 32.0–48.0)
pH, Arterial: 7.435 (ref 7.350–7.450)
pO2, Arterial: 72 mmHg — ABNORMAL LOW (ref 83.0–108.0)

## 2021-10-24 LAB — CBC
HCT: 30.2 % — ABNORMAL LOW (ref 39.0–52.0)
Hemoglobin: 9.4 g/dL — ABNORMAL LOW (ref 13.0–17.0)
MCH: 28.7 pg (ref 26.0–34.0)
MCHC: 31.1 g/dL (ref 30.0–36.0)
MCV: 92.4 fL (ref 80.0–100.0)
Platelets: 75 10*3/uL — ABNORMAL LOW (ref 150–400)
RBC: 3.27 MIL/uL — ABNORMAL LOW (ref 4.22–5.81)
RDW: 15.6 % — ABNORMAL HIGH (ref 11.5–15.5)
WBC: 11.4 10*3/uL — ABNORMAL HIGH (ref 4.0–10.5)
nRBC: 0 % (ref 0.0–0.2)

## 2021-10-24 LAB — MAGNESIUM: Magnesium: 1.6 mg/dL — ABNORMAL LOW (ref 1.7–2.4)

## 2021-10-24 LAB — GLUCOSE, CAPILLARY
Glucose-Capillary: 135 mg/dL — ABNORMAL HIGH (ref 70–99)
Glucose-Capillary: 141 mg/dL — ABNORMAL HIGH (ref 70–99)
Glucose-Capillary: 112 mg/dL — ABNORMAL HIGH (ref 70–99)
Glucose-Capillary: 96 mg/dL (ref 70–99)
Glucose-Capillary: 95 mg/dL (ref 70–99)
Glucose-Capillary: 100 mg/dL — ABNORMAL HIGH (ref 70–99)

## 2021-10-24 LAB — LACTIC ACID, PLASMA
Lactic Acid, Venous: 5.1 mmol/L (ref 0.5–1.9)
Lactic Acid, Venous: 5.7 mmol/L (ref 0.5–1.9)
Lactic Acid, Venous: 5.4 mmol/L (ref 0.5–1.9)
Lactic Acid, Venous: 5.1 mmol/L (ref 0.5–1.9)

## 2021-10-24 LAB — LIPASE, BLOOD: Lipase: 109 U/L — ABNORMAL HIGH (ref 11–51)

## 2021-10-24 LAB — PHOSPHORUS: Phosphorus: 5.8 mg/dL — ABNORMAL HIGH (ref 2.5–4.6)

## 2021-10-24 MED ORDER — LORAZEPAM 2 MG/ML IJ SOLN
INTRAMUSCULAR | Status: AC
  Filled 2021-10-24: qty 1

## 2021-10-24 MED ORDER — LORAZEPAM 2 MG/ML IJ SOLN
1.0000 mg | Freq: Once | INTRAMUSCULAR | Status: AC
  Administered 2021-10-24: 1 mg via INTRAVENOUS

## 2021-10-24 MED ORDER — ASPIRIN 81 MG PO CHEW
81.0000 mg | CHEWABLE_TABLET | Freq: Every day | ORAL | Status: DC
  Administered 2021-10-24: 81 mg via ORAL
  Filled 2021-10-24: qty 1

## 2021-10-24 MED ORDER — METOPROLOL SUCCINATE ER 50 MG PO TB24
25.0000 mg | ORAL_TABLET | Freq: Every day | ORAL | Status: DC
  Administered 2021-10-24: 25 mg via ORAL
  Filled 2021-10-24: qty 1

## 2021-10-24 MED ORDER — LACTATED RINGERS IV SOLN: INTRAVENOUS | Status: DC

## 2021-10-24 MED ORDER — TAMSULOSIN HCL 0.4 MG PO CAPS
0.4000 mg | ORAL_CAPSULE | Freq: Every day | ORAL | Status: DC
  Administered 2021-10-24 – 2021-11-05 (×11): 0.4 mg via ORAL
  Filled 2021-10-24 (×12): qty 1

## 2021-10-24 MED ORDER — FUROSEMIDE 10 MG/ML IJ SOLN
80.0000 mg | Freq: Once | INTRAMUSCULAR | Status: AC
Start: 2021-10-24 — End: 2021-10-24
  Administered 2021-10-24: 80 mg via INTRAVENOUS
  Filled 2021-10-24: qty 8

## 2021-10-24 MED ORDER — CALCIUM GLUCONATE-NACL 1-0.675 GM/50ML-% IV SOLN
1.0000 g | Freq: Once | INTRAVENOUS | Status: AC
  Administered 2021-10-24: 1000 mg via INTRAVENOUS
  Filled 2021-10-24: qty 50

## 2021-10-24 MED ORDER — HALOPERIDOL LACTATE 5 MG/ML IJ SOLN: 1.0000 mg | Freq: Four times a day (QID) | INTRAMUSCULAR | Status: DC | PRN

## 2021-10-24 MED ORDER — MAGNESIUM SULFATE 2 GM/50ML IV SOLN
2.0000 g | Freq: Once | INTRAVENOUS | Status: AC
  Administered 2021-10-24: 2 g via INTRAVENOUS
  Filled 2021-10-24: qty 50

## 2021-10-24 NOTE — Progress Notes (Signed)
[facility] Progress Note Patient Name: [name] Young. DOB: [birth year] MRN: [id number]   Date of Service  10/24/2021  HPI/Events of Note  ABG = 7.435/30.3/72/20.4.  eICU Interventions  Continue present management.      Intervention Category Major Interventions: Other:  Lysle Dingwall 10/24/2021, 11:00 PM

## 2021-10-24 NOTE — Progress Notes (Signed)
[facility] Progress Note Patient Name: [name] Young. DOB: 08/04/1941 MRN: [id number]   Date of Service  10/24/2021  HPI/Events of Note  Multiple issues: 1. Oliguria - Creatinine = 3.19. LVEF = 40-45%. BP = 122/87. IV fluids stopped d/t lack of response to Lasix per progress note. However, I can't find that Lasix was given on the [facility]. 2. Decrease LOC - Patient also noted to be lethargic post Ativan dose. Sat = 98%.   eICU Interventions  Plan: Lasix 80 mg IV X 1 now.  ABG STAT.      Intervention Category Major Interventions: Other:  Lysle Dingwall 10/24/2021, 9:06 PM

## 2021-10-24 NOTE — Progress Notes (Signed)
Patient agitated this afternoon, responded to ativan. Per RN, wife reports history of sundowning during previous admissions. He was placed in restraints to prevent him from getting out of bed. Haldol PRN ordered. IVF stopped since no response to lasix today. Placing foley for strict I/Os- can remove in 1-2 days of he remains anuric.   Julian Hy, DO 10/24/21 4:19 PM [facility]

## 2021-10-24 NOTE — Consult Note (Signed)
Cardiology Consultation:   Patient ID: [name] Young. MRN: [id number]; DOB: 28-Jun-1941  Admit date: 10/23/2021 Date of Consult: 10/24/2021  PCP:  Ria Bush, MD   [facility] Providers Cardiologist:  Nelva Bush, MD        Patient Profile:   [name] Young. is a 81 y.o. male with a hx of 3v CAD with CABG 2019, with post op a fib, HTN, HLD, DM-2, CKD 3, now admitted with abd pain due to biliary tract obstruction and ascending cholangitis undergoing ERCP removing 2 stones, sepsis and a fib who is being seen 10/24/2021 for the evaluation of atrial fib and new CM with EF 40-45% at the request of Dr. Carlis Abbott with CCM.  History of Present Illness:   [name] Young with above hx including CAD with CABG 01/18/2018 with LIMA to LAD, VG to diag and VG to PLA. Coronary endarterectomy of LAD.  He did have post op a fib but none since.  Echo prior to cath EF 50-55%.  He has HTN not well controlled and pt wished to lower with diet - unable to tolerate high doses of statins due to myalgias.   Pt presented 10/23/21 with abd pain, dx with sepsis, due to biliary tract obstruction and ascending cholangitis undergoing ERCP removing 2 stones and bacteremia.  Will need elective cholecystectomy as outpt.      EKG:  The EKG was personally reviewed and demonstrates:  ST with freq PVCs   second EKG with bursts of NSVT 3-4 beats.  3rd EKG SR with occ PVC.  Telemetry:  Telemetry was personally reviewed and demonstrates:  ST with PVCs Labs na 138 K+ 3.7 GLucose 374 BUN 37 Cr 3.19  Mg 1.6  Lipase 109, AST 1,557  ALT 849  lactic acid improved 5.4 Hgb 9.4  WBC 11.4  plts 75  Hs troponin 68 then 142.   PCXR stable mild cardiopulmonary disease CTA chest IMPRESSION: 1. No evidence of acute aortic syndrome. 2. No acute findings in the thorax to account for the patient's symptoms. 3. 5 mm calculus at the right ureteropelvic junction. At this time, there is no proximal hydronephrosis to indicate  urinary tract obstruction. 4. Cholelithiasis without evidence of acute cholecystitis. 5. Dilatation of the common bile duct. No intrahepatic biliary ductal dilatation to clearly indicate biliary tract obstruction. Additionally, there is no calcified choledocholithiasis. This is of uncertain etiology and significance, and could be age related, however, correlation with liver function tests is recommended. If there is clinical concern for biliary tract obstruction, further evaluation with abdominal MRI with and without IV gadolinium with MRCP would be recommended. 6. Aortic atherosclerosis, in addition to left main and three-vessel coronary artery disease. Status post median sternotomy for CABG including LIMA to the LAD. 7. There are calcifications of the aortic valve. Echocardiographic correlation for evaluation of potential valvular dysfunction may be warranted if clinically indicated  BP 148/70 P 101 to 115  resp 25-33 afebrile.  Confused, hands wrapped   Past Medical History:  Diagnosis Date   Arthritis    Cataract    lens implant bilateral   Chronic kidney disease    kidney stones   Coronary artery disease    Dyspnea    Dysrhythmia    GERD (gastroesophageal reflux disease)    History of cholelithiasis    History of kidney stones    ca ox [name] @ [facility]) now [employer]   History of pneumonia    HLD (hyperlipidemia)    HTN (hypertension)  Jaundice    age 73   Pneumonia    years ago    T2DM (type 2 diabetes mellitus) ([place]) 2010    Past Surgical History:  Procedure Laterality Date   CARPAL TUNNEL RELEASE Bilateral    CATARACT EXTRACTION, BILATERAL     COLONOSCOPY  11/2012   11 adenomatous polyps, diverticulosis, rec rpt 1 yr Ardis Hughs)   COLONOSCOPY  12/2013   3 polyps, diverticulosis, rec rpt 3 yrs Ardis Hughs)   COLONOSCOPY  06/2019   6 polyps (TA), diverticulosis, f/u left open ended Ardis Hughs)   CORONARY ARTERY BYPASS GRAFT N/A 01/18/2018   Procedure: CORONARY  ARTERY BYPASS GRAFTING (CABG) x 3; Using Left Internal Mammary Artery, and Right Greater Saphenous Vein harvested Endoscopically, Coronary Artery Endarterectomy;  Surgeon: Ivin Poot, MD;  Location: [place];  Service: Open Heart Surgery;  Laterality: N/A;   ERCP N/A 10/23/2021   Procedure: ENDOSCOPIC RETROGRADE CHOLANGIOPANCREATOGRAPHY (ERCP);  Surgeon: Ladene Artist, MD;  Location: [facility] ENDOSCOPY;  Service: Endoscopy;  Laterality: N/A;   KNEE CARTILAGE SURGERY Left    LEFT HEART CATH AND CORONARY ANGIOGRAPHY N/A 12/21/2017   Procedure: LEFT HEART CATH AND CORONARY ANGIOGRAPHY;  Surgeon: Nelva Bush, MD;  Location: [place] CV LAB;  Service: Cardiovascular;  Laterality: N/A;   LITHOTRIPSY     REMOVAL OF STONES  10/23/2021   Procedure: REMOVAL OF STONES;  Surgeon: Ladene Artist, MD;  Location: [facility] ENDOSCOPY;  Service: Endoscopy;;   SPHINCTEROTOMY  10/23/2021   Procedure: Joan Mayans;  Surgeon: Ladene Artist, MD;  Location: [place];  Service: Endoscopy;;   TEE WITHOUT CARDIOVERSION N/A 01/18/2018   Procedure: TRANSESOPHAGEAL ECHOCARDIOGRAM (TEE);  Surgeon: Prescott Gum, Collier Salina, MD;  Location: [place];  Service: Open Heart Surgery;  Laterality: N/A;   UMBILICAL HERNIA REPAIR     with mesh     Home Medications:  Prior to Admission medications   Medication Sig Start Date End Date Taking? Authorizing Provider  aspirin EC 81 MG tablet Take 81 mg by mouth daily.   Yes [provider]  Cholecalciferol (VITAMIN D3) 25 MCG (1000 UT) CAPS Take 1 capsule (1,000 Units total) by mouth daily. 05/24/20  Yes Ria Bush, MD  Cyanocobalamin (B-12) 1000 MCG SUBL Place 1 tablet under the tongue daily. 05/24/20  Yes Ria Bush, MD  fenofibrate (TRICOR) 145 MG tablet Take 1 tablet (145 mg total) by mouth daily. 06/07/21  Yes Ria Bush, MD  lisinopril (ZESTRIL) 40 MG tablet TAKE 1 TABLET BY MOUTH ONCE A DAY Patient taking differently: Take 40 mg by mouth daily. 07/18/21  Yes  End, Harrell Gave, MD  metFORMIN (GLUCOPHAGE) 500 MG tablet Take 1 tablet (500 mg total) by mouth daily with breakfast. 06/07/21  Yes Ria Bush, MD  metoprolol succinate (TOPROL-XL) 25 MG 24 hr tablet TAKE 1 TABLET BY MOUTH ONCE A DAY WITH OR IMMEDIATELY FOLLOWING A MEAL Patient taking differently: Take 25 mg by mouth daily. TAKE 1 TABLET BY MOUTH ONCE A DAY WITH OR IMMEDIATELY FOLLOWING A MEAL 09/21/20  Yes End, Harrell Gave, MD  Multiple Vitamins-Minerals (MACULAR VITAMIN BENEFIT PO) Take 1 tablet by mouth daily.   Yes [provider]  Omega-3 Fatty Acids (FISH OIL) 1200 MG CAPS Take 2 capsules (2,400 mg total) by mouth daily. 05/24/20  Yes Ria Bush, MD  omeprazole (PRILOSEC) 40 MG capsule Take 1 capsule (40 mg total) by mouth every Monday, Wednesday, and Friday. 06/08/21  Yes Ria Bush, MD  rosuvastatin (CRESTOR) 10 MG tablet TAKE 1 TABLET BY  MOUTH ONCE A DAY Patient taking differently: Take 10 mg by mouth daily. 07/13/21  Yes End, Harrell Gave, MD  sitaGLIPtin (JANUVIA) 50 MG tablet Take 1 tablet (50 mg total) by mouth daily. 06/07/21  Yes Ria Bush, MD  ACCU-CHEK AVIVA PLUS test strip USE AS DIRECTED TO CHECK BLOOD SUGARS UPTO FOUR TIMES DAILY. 03/16/20   Ria Bush, MD  blood glucose meter kit and supplies Dispense based on patient and insurance preference. Use up to four times daily as directed. (FOR ICD-10 E10.9, E11.9). 02/01/18   Angiulli, Lavon Paganini, PA-C  nitroGLYCERIN (NITROSTAT) 0.4 MG SL tablet Place 1 tablet (0.4 mg total) under the tongue every 5 (five) minutes as needed for chest pain. 03/26/20   End, Harrell Gave, MD    Inpatient Medications: Scheduled Meds:  aspirin  81 mg Oral Daily   Chlorhexidine Gluconate Cloth  6 each Topical Q0600   heparin  5,000 Units Subcutaneous Q8H   insulin aspart  0-15 Units Subcutaneous Q4H   metoprolol succinate  25 mg Oral Daily   tamsulosin  0.4 mg Oral Daily   Continuous Infusions:  cefTRIAXone (ROCEPHIN)   IV 2 g (10/24/21 1446)   lactated ringers 50 mL/hr at 10/24/21 1323   metronidazole Stopped (10/24/21 0425)   PRN Meds: docusate sodium, ondansetron (ZOFRAN) IV, polyethylene glycol  Allergies:   No Known Allergies  Social History:   Social History   Socioeconomic History   Marital status: Married    Spouse name: Not on file   Number of children: Not on file   Years of education: Not on file   Highest education level: Not on file  Occupational History   Not on file  Tobacco Use   Smoking status: Never   Smokeless tobacco: Former    Types: Chew    Quit date: 09/08/2001  Vaping Use   Vaping Use: Never used  Substance and Sexual Activity   Alcohol use: Yes    Comment: occ   Drug use: No   Sexual activity: Not on file  Other Topics Concern   Not on file  Social History Narrative   Caffeine: occasional   Lives with wife, [name] 2009)   Occupation: retired, worked for state on [employer]   Activity: likes to hunt bears.   Diet: some water, fruits/vegetables daily, avoids potatoes   Social Determinants of Health   Financial Resource Strain: Not on file  Food Insecurity: Not on file  Transportation Needs: Not on file  Physical Activity: Not on file  Stress: Not on file  Social Connections: Not on file  Intimate Partner Violence: Not on file    Family History:    Family History  Problem Relation Age of Onset   Alzheimer's disease Mother    Breast cancer Mother        breast   Atrial fibrillation Mother    CAD Mother    Stroke Father 43   Diabetes Father    CAD Father    Colon polyps Sister    Colon polyps Brother    Rectal cancer Maternal Grandfather        rectal   Colon cancer Maternal Grandfather 88   Esophageal cancer Neg Hx    Stomach cancer Neg Hx      ROS:  Please see the history of present illness.  General:no colds or fevers, no weight changes Skin:no rashes or ulcers HEENT:no blurred vision, no congestion CV:see HPI PUL:see  HPI GI:no diarrhea constipation or melena, no indigestion GU:no  hematuria, no dysuria MS:no joint pain, no claudication Neuro:no syncope, no lightheadedness Endo:++ diabetes, no thyroid disease  All other ROS reviewed and negative.     Physical Exam/Data:   Vitals:   10/24/21 0800 10/24/21 0900 10/24/21 1121 10/24/21 1146  BP: 126/63 96/72 (!) 128/55   Pulse: 97 94 94   Resp: (!) 25 (!) 25    Temp:    97.7 F (36.5 C)  TempSrc:    Oral  SpO2:  92%    Weight:      Height:        Intake/Output Summary (Last 24 hours) at 10/24/2021 1511 Last data filed at 10/24/2021 1019 Gross per 24 hour  Intake 2681.96 ml  Output 50 ml  Net 2631.96 ml   Last 3 Weights 10/23/2021 06/07/2021 01/28/2021  Weight (lbs) 230 lb 260 lb 3 oz 262 lb  Weight (kg) 104.327 kg 118.02 kg 118.842 kg     Body mass index is 32.08 kg/m.  General:  Well nourished, well developed, in no acute distress with some confusion HEENT: normal Neck: no JVD Vascular: No carotid bruits; Distal pulses 2+ bilaterally Cardiac:  normal S1, S2; RRR; no murmur gallup rub or click Lungs:  clear to auscultation bilaterally, no wheezing, rhonchi or rales  Abd: soft, nontender, no hepatomegaly  Ext: no edema Musculoskeletal:  No deformities, BUE and BLE strength normal and equal Skin: warm and dry  Neuro:  confusion, did say he was fine but moving all over bed intermittently.  no focal abnormalities noted Psych:  Normal affect   Relevant CV Studies: Cardiovascular Problems: Multivessel coronary artery disease   Risk Factors: Hypertension, hyperlipidemia, type 2 diabetes mellitus, obesity, male gender, and age greater than 58   Cath/PCI: LHC (12/21/17): LMCA normal.  LAD with sequential 20% proximal and 70-80% mid LAD stenoses.  Mid LAD lesion involves moderate-caliber D1 branch, with 70% ostial and 90% mid stenoses Ladona Mow 0,1,1).  Large LCx with small OM1 with 80% stenosis and moderate OM2 with subtotal chronic occlusion  and bridging collaterals, and chronic total occlusion of the proximal RCA with left-to-right collaterals filling the RPDA.   CV Surgery: CABG (01/18/2018, Dr. Prescott Gum): LIMA to LAD, SVG to diagonal, and SVG to PL branch of RCA.  LAD endarterectomy required for bypass grafting.   EP Procedures and Devices: None   Non-Invasive Evaluation(s): Exercise MPI (12/12/17): Intermediate risk study with moderate in size, moderate in severity basal and mid inferolateral reversible defect consistent with ischemia.  LVEF mildly reduced at 51% with inferolateral hypokinesis.  Poor exercise tolerance with frequent PVCs noted during stress. TTE (11/23/17): Normal LV size with mild LVH.  LVEF 50-55% with normal wall motion and grade 1 diastolic dysfunction.  Mildly thickened and calcified mitral valve consistent with rheumatic disease.  No evidence of stenosis.  Trivial regurgitation.  Normal RV size and function.  Normal PA pressure.    Laboratory Data:  High Sensitivity Troponin:   Recent Labs  Lab 10/23/21 0334 10/23/21 0559  TROPONINIHS 68* 142*     Chemistry Recent Labs  Lab 10/23/21 0334 10/23/21 0431 10/23/21 0933 10/23/21 2154 10/24/21 0411  NA 137 139  --  136 138  K 4.9 5.1  --  4.1 3.7  CL 106  --   --  105 99  CO2 13*  --   --  14* 23  GLUCOSE 219*  --   --  108* 374*  BUN 24*  --   --  34* 37*  CREATININE 1.91*  --  2.11* 2.67* 3.19*  CALCIUM 9.1  --   --  7.6* 7.1*  MG 1.4*  --   --  1.3* 1.6*  GFRNONAA 35*  --  31* 24* 19*  ANIONGAP 18*  --   --  17* 16*    Recent Labs  Lab 10/23/21 0334 10/24/21 0411  PROT 6.1* 4.8*  ALBUMIN 3.1* 2.3*  AST 532* 1,557*  ALT 235* 849*  ALKPHOS 149* 106  BILITOT 5.9* 3.7*   Lipids No results for input(s): CHOL, TRIG, HDL, LABVLDL, LDLCALC, CHOLHDL in the last 168 hours.  Hematology Recent Labs  Lab 10/23/21 0933 10/23/21 2154 10/24/21 0411  WBC 14.8* 16.2* 11.4*  RBC 4.06* 3.21* 3.27*  HGB 12.0* 9.5* 9.4*  HCT 38.3* 30.6* 30.2*   MCV 94.3 95.3 92.4  MCH 29.6 29.6 28.7  MCHC 31.3 31.0 31.1  RDW 15.2 15.7* 15.6*  PLT 149* 96* 75*   Thyroid No results for input(s): TSH, FREET4 in the last 168 hours.  BNPNo results for input(s): BNP, PROBNP in the last 168 hours.  DDimer No results for input(s): DDIMER in the last 168 hours.   Radiology/Studies:  CT HEAD WO CONTRAST (5MM)  Result Date: 10/23/2021 CLINICAL DATA:  Delirium EXAM: CT HEAD WITHOUT CONTRAST TECHNIQUE: Contiguous axial images were obtained from the base of the skull through the vertex without intravenous contrast. RADIATION DOSE REDUCTION: This exam was performed according to the departmental dose-optimization program which includes automated exposure control, adjustment of the mA and/or kV according to patient size and/or use of iterative reconstruction technique. COMPARISON:  None. FINDINGS: Brain: No evidence of acute infarction, hemorrhage, hydrocephalus, extra-axial collection or mass lesion/mass effect. Mild cerebral volume loss and white matter low-density in keeping with age. Vascular: No hyperdense vessel or unexpected calcification. Skull: Normal. Negative for fracture or focal lesion. Sinuses/Orbits: No acute finding. IMPRESSION: No acute finding.  Unremarkable study for age. Electronically Signed   By: Jorje Guild M.D.   On: 10/23/2021 07:15   DG Chest Portable 1 View  Result Date: 10/23/2021 CLINICAL DATA:  AFib with RVR EXAM: PORTABLE CHEST 1 VIEW COMPARISON:  03/06/2018 FINDINGS: Lungs are clear.  No pleural effusion or pneumothorax. Stable mild cardiomegaly. Postsurgical changes related to prior CABG. Median sternotomy. IMPRESSION: Stable mild cardiomegaly. No evidence of acute cardiopulmonary disease. Electronically Signed   By: Julian Hy M.D.   On: 10/23/2021 03:55   DG ERCP WITH SPHINCTEROTOMY  Result Date: 10/23/2021 CLINICAL DATA:  Chest and back pain Cholelithiasis Dilated CBD on CT EXAM: ERCP TECHNIQUE: Multiple spot images  obtained with the fluoroscopic device and submitted for interpretation post-procedure. FLUOROSCOPY: Radiation Exposure Index (as provided by the fluoroscopic device): 65.5 mGy Kerma COMPARISON:  None. FINDINGS: Thirteen images were submitted for interpretation. The submitted images demonstrate cannulation of the common bile duct. A filling defect is seen in the mid duct on the initial images which is no longer identified following balloon sweep. IMPRESSION: Intraoperative fluoroscopic images of ERCP as above. These images were submitted for radiologic interpretation only. Please see the procedural report for the amount of contrast and the fluoroscopy time utilized. Electronically Signed   By: Miachel Roux M.D.   On: 10/23/2021 11:45   ECHOCARDIOGRAM COMPLETE  Result Date: 10/23/2021    ECHOCARDIOGRAM REPORT   Patient Name:   Kenny Rea. Date of Exam: 10/23/2021 Medical Rec #:  [id number]            Height:  71.0 in Accession #:    [id number]           Weight:       230.0 lb Date of Birth:  13-Jun-1941           BSA:          2.238 m Patient Age:    27 years             BP:           96/53 mmHg Patient Gender: M                    HR:           92 bpm. Exam Location:  Inpatient Procedure: 2D Echo Indications:    dyspnea  History:        Patient has prior history of Echocardiogram examinations, most                 recent 01/18/2018. Prior CABG, Sepsis. Chronic kidney disease;                 Risk Factors:Diabetes, Dyslipidemia and Hypertension.  Sonographer:    Johny Chess RDCS Referring Phys: [place]  1. Left ventricular ejection fraction, by estimation, is 40 to 45%. The left ventricle has mildly decreased function. The left ventricle demonstrates global hypokinesis. Left ventricular diastolic parameters are consistent with Grade II diastolic dysfunction (pseudonormalization). Elevated left ventricular end-diastolic pressure.  2. Right ventricular systolic function is  normal. The right ventricular size is normal. There is normal pulmonary artery systolic pressure. The estimated right ventricular systolic pressure is 09.8 mmHg.  3. Left atrial size was mildly dilated.  4. The mitral valve is degenerative. Trivial mitral valve regurgitation. No evidence of mitral stenosis.  5. The aortic valve is calcified. There is moderate calcification of the aortic valve. There is moderate thickening of the aortic valve. Aortic valve regurgitation is not visualized. Aortic valve sclerosis/calcification is present, without any evidence of aortic stenosis.  6. There is mild dilatation of the aortic root, measuring 41 mm. There is mild dilatation of the ascending aorta, measuring 38 mm.  7. The inferior vena cava is dilated in size with >50% respiratory variability, suggesting right atrial pressure of 8 mmHg. FINDINGS  Left Ventricle: Left ventricular ejection fraction, by estimation, is 40 to 45%. The left ventricle has mildly decreased function. The left ventricle demonstrates global hypokinesis. The left ventricular internal cavity size was normal in size. There is  no left ventricular hypertrophy. Left ventricular diastolic parameters are consistent with Grade II diastolic dysfunction (pseudonormalization). Elevated left ventricular end-diastolic pressure. Right Ventricle: The right ventricular size is normal. No increase in right ventricular wall thickness. Right ventricular systolic function is normal. There is normal pulmonary artery systolic pressure. The tricuspid regurgitant velocity is 2.02 m/s, and  with an assumed right atrial pressure of 8 mmHg, the estimated right ventricular systolic pressure is 11.9 mmHg. Left Atrium: Left atrial size was mildly dilated. Right Atrium: Right atrial size was normal in size. Pericardium: There is no evidence of pericardial effusion. Mitral Valve: The mitral valve is degenerative in appearance. There is moderate thickening of the mitral valve  leaflet(s). There is moderate calcification of the mitral valve leaflet(s). Mild mitral annular calcification. Trivial mitral valve regurgitation. No evidence of mitral valve stenosis. Tricuspid Valve: The tricuspid valve is normal in structure. Tricuspid valve regurgitation is trivial. No evidence of tricuspid stenosis. Aortic Valve: The aortic valve is  calcified. There is moderate calcification of the aortic valve. There is moderate thickening of the aortic valve. Aortic valve regurgitation is not visualized. Aortic valve sclerosis/calcification is present, without any  evidence of aortic stenosis. Pulmonic Valve: The pulmonic valve was normal in structure. Pulmonic valve regurgitation is trivial. No evidence of pulmonic stenosis. Aorta: The aortic root is normal in size and structure. There is mild dilatation of the aortic root, measuring 41 mm. There is mild dilatation of the ascending aorta, measuring 38 mm. Venous: The inferior vena cava is dilated in size with greater than 50% respiratory variability, suggesting right atrial pressure of 8 mmHg. IAS/Shunts: No atrial level shunt detected by color flow Doppler.  LEFT VENTRICLE PLAX 2D LVIDd:         6.30 cm      Diastology LVIDs:         4.60 cm      LV e' medial:    5.87 cm/s LV PW:         1.10 cm      LV E/e' medial:  16.7 LV IVS:        1.00 cm      LV e' lateral:   7.29 cm/s LVOT diam:     2.60 cm      LV E/e' lateral: 13.5 LV SV:         73 LV SV Index:   33 LVOT Area:     5.31 cm  LV Volumes (MOD) LV vol d, MOD A2C: 115.0 ml LV vol s, MOD A2C: 75.8 ml LV SV MOD A2C:     39.2 ml RIGHT VENTRICLE            IVC RV S prime:     9.03 cm/s  IVC diam: 2.60 cm TAPSE (M-mode): 1.3 cm LEFT ATRIUM             Index        RIGHT ATRIUM           Index LA diam:        4.30 cm 1.92 cm/m   RA Area:     17.00 cm LA Vol (A2C):   87.2 ml 38.97 ml/m  RA Volume:   43.90 ml  19.62 ml/m LA Vol (A4C):   66.9 ml 29.90 ml/m LA Biplane Vol: 77.2 ml 34.50 ml/m  AORTIC VALVE  LVOT Vmax:   78.30 cm/s LVOT Vmean:  52.600 cm/s LVOT VTI:    0.138 m  AORTA Ao Root diam: 4.10 cm Ao Asc diam:  3.80 cm MITRAL VALVE               TRICUSPID VALVE MV Area (PHT): 4.49 cm    TR Peak grad:   16.3 mmHg MV Decel Time: 169 msec    TR Vmax:        202.00 cm/s MV E velocity: 98.20 cm/s MV A velocity: 83.10 cm/s  SHUNTS MV E/A ratio:  1.18        Systemic VTI:  0.14 m                            Systemic Diam: 2.60 cm Fransico Him MD Electronically signed by Fransico Him MD Signature Date/Time: 10/23/2021/4:51:21 PM    Final    CT Angio Chest/Abd/Pel for Dissection W and/or Wo Contrast  Result Date: 10/23/2021 CLINICAL DATA:  81 year old male with history of chest and back pain. Suspected aortic dissection. EXAM:  CT ANGIOGRAPHY CHEST, ABDOMEN AND PELVIS TECHNIQUE: Non-contrast CT of the chest was initially obtained. Multidetector CT imaging through the chest, abdomen and pelvis was performed using the standard protocol during bolus administration of intravenous contrast. Multiplanar reconstructed images and MIPs were obtained and reviewed to evaluate the vascular anatomy. RADIATION DOSE REDUCTION: This exam was performed according to the departmental dose-optimization program which includes automated exposure control, adjustment of the mA and/or kV according to patient size and/or use of iterative reconstruction technique. CONTRAST:  64m OMNIPAQUE IOHEXOL 350 MG/ML SOLN COMPARISON:  Chest CT 01/17/2018. FINDINGS: CTA CHEST FINDINGS Cardiovascular: Precontrast images demonstrate no crescentic high attenuation associated with the wall of the thoracic aorta to suggest the presence of acute intramural hemorrhage. Postcontrast images demonstrate no evidence of thoracic aortic aneurysm or dissection. Heart size is normal. There is no significant pericardial fluid, thickening or pericardial calcification. There is aortic atherosclerosis, as well as atherosclerosis of the great vessels of the mediastinum and  the coronary arteries, including calcified atherosclerotic plaque in the left main, left anterior descending, left circumflex and right coronary arteries. Status post median sternotomy for CABG including LIMA to the LAD. Calcifications of the aortic valve. Mediastinum/Nodes: No pathologically enlarged mediastinal or hilar lymph nodes. Esophagus is unremarkable in appearance. No axillary lymphadenopathy. Lungs/Pleura: No acute consolidative airspace disease. No pleural effusions. No definite suspicious appearing pulmonary nodules or masses are noted on today's examination which is limited by considerable patient motion. Dependent areas of subsegmental atelectasis or scarring are noted in the lung bases bilaterally. Musculoskeletal: Median sternotomy wires. There are no aggressive appearing lytic or blastic lesions noted in the visualized portions of the skeleton. Review of the MIP images confirms the above findings. CTA ABDOMEN AND PELVIS FINDINGS VASCULAR Aorta: Normal caliber aorta without aneurysm, dissection, vasculitis or significant stenosis. Celiac: Patent without evidence of aneurysm, dissection, vasculitis or significant stenosis. SMA: Patent without evidence of aneurysm, dissection, vasculitis or significant stenosis. Renals: Both renal arteries are patent without evidence of aneurysm, dissection, vasculitis, fibromuscular dysplasia or significant stenosis. IMA: Patent without evidence of aneurysm, dissection, vasculitis or significant stenosis. Inflow: Patent without evidence of aneurysm, dissection, vasculitis or significant stenosis. Veins: No obvious venous abnormality within the limitations of this arterial phase study. Review of the MIP images confirms the above findings. NON-VASCULAR Hepatobiliary: No suspicious cystic or solid hepatic lesions are confidently identified on today's arterial phase examination. Partially calcified gallstone measuring 1.1 cm in diameter lying dependently in the  gallbladder. No findings to suggest an acute cholecystitis are noted at this time. Common bile duct measures 11 mm in the porta hepatis. No calcified stones are identified in the common bile duct. No intrahepatic biliary ductal dilatation. Pancreas: No pancreatic mass. No pancreatic ductal dilatation. No pancreatic or peripancreatic fluid collections or inflammatory changes. Spleen: Unremarkable. Adrenals/Urinary Tract: 5 mm calculus at the right ureteropelvic junction (axial image 191 of series 7). No proximal hydroureteronephrosis to indicate active obstruction at this time. There is extensive perinephric stranding bilaterally (nonspecific). Low-attenuation lesions in both kidneys, compatible with simple cysts, measuring up to 4.4 cm in the lower pole of the right kidney. Urinary bladder is normal in appearance. Bilateral adrenal glands are normal in appearance. Stomach/Bowel: Normal appearance of the stomach. No pathologic dilatation of small bowel or colon. Numerous colonic diverticulae are noted, particularly in the sigmoid colon, without definite focal surrounding inflammatory changes to clearly indicate an associated diverticulitis at this time. The appendix is not confidently identified and may be surgically absent. Regardless, there  are no inflammatory changes noted adjacent to the cecum to suggest the presence of an acute appendicitis at this time. Lymphatic: No lymphadenopathy noted in the abdomen or pelvis. Reproductive: Prostate gland and seminal vesicles are unremarkable in appearance. Other: No significant volume of ascites.  No pneumoperitoneum. Musculoskeletal: There are no aggressive appearing lytic or blastic lesions noted in the visualized portions of the skeleton. Review of the MIP images confirms the above findings. IMPRESSION: 1. No evidence of acute aortic syndrome. 2. No acute findings in the thorax to account for the patient's symptoms. 3. 5 mm calculus at the right ureteropelvic junction.  At this time, there is no proximal hydronephrosis to indicate urinary tract obstruction. 4. Cholelithiasis without evidence of acute cholecystitis. 5. Dilatation of the common bile duct. No intrahepatic biliary ductal dilatation to clearly indicate biliary tract obstruction. Additionally, there is no calcified choledocholithiasis. This is of uncertain etiology and significance, and could be age related, however, correlation with liver function tests is recommended. If there is clinical concern for biliary tract obstruction, further evaluation with abdominal MRI with and without IV gadolinium with MRCP would be recommended. 6. Aortic atherosclerosis, in addition to left main and three-vessel coronary artery disease. Status post median sternotomy for CABG including LIMA to the LAD. 7. There are calcifications of the aortic valve. Echocardiographic correlation for evaluation of potential valvular dysfunction may be warranted if clinically indicated. 8. Additional incidental findings, as above. Electronically Signed   By: Vinnie Langton M.D.   On: 10/23/2021 07:20     Assessment and Plan:   Sepsis due to cholangitis, bacteremia per CCM and ID to see.  No shock.  ERCP has been performed.  Will need outpt elective cholecystectomy, on ABX  AKI with oliguria Cr 3.19 Elevated transaminases hyperbilirubinemia due to cholangitis, s/p ERCP per IM   Acute HFrEF h/o CABG and echo with EF 40-45% previously 50-55 % --EKG lateral ST depression lateral leads.  Hs troponin 68 then 142  - may be demand from acute sepsis but once improved would need ischemic eval.  Pt has not wanted many meds as outpt - once able to add meds plan for GDMT.  He was on ACE but now AKI , HR elevated BB would help.  Along with imdur and hydralazine, for now with AKI not much we can add.  HLD on crestor and tricor on hold with elevated LFTs but will resume once improved.   Does not tolerate higher doses.  CAD with hx CABG 2019 with LIMA to LAD VG  to diag and VG to PLA.   No angina. HTN has been difficult to control as outpt. Was on lisionpril 40 daily.   DM-2 per CCM Ammonia level up at 57  Confusion     Risk Assessment/Risk Scores:        New York Heart Association (NYHA) Functional Class NYHA Class III        For questions or updates, please contact [place] Please consult [email or web address] for contact info under    Signed, Cecilie Kicks, NP  10/24/2021 3:11 PM

## 2021-10-24 NOTE — Progress Notes (Addendum)
Attending physician's note   I have taken an interval history, reviewed the chart and examined the patient. I agree with the Advanced Practitioner's note, impression, and recommendations as outlined. I spent over >50% of the total time with the patient, to include discussion with his spouse at bedside and coordinating the plan.   1) Ascending cholangitis 2) Choledocholithiasis 3) Sepsis -s/p ERCP on 10/23/2021 with sphincterotomy and balloon sweeps, with removal of 2 stones, sludge, and pus - Blood cultures with E. coli, Enterobacterales, and Streptococcus - Continue broad-spectrum ABX - Will eventually need surgical consult to discuss if/when cholecystectomy -No ASA or NSAIDs 1 week post ERCP - Slowly advancing diet as tolerated  4) Elevated liver enzymes - Uptrending liver enzymes today, likely multifactorial, including underlying sepsis but also possibly component of starting on amiodarone - Continue trending enzymes - T. bili otherwise downtrending s/p ERCP  5) CAD s/p CABG 6) Paroxysmal A-fib - Amiodarone was stopped by primary service this morning.  Restarted metoprolol   Gerrit Heck, DO, FACG [id number] office          Progress Note   Subjective  Chief Complaint: Abdominal pain and elevated LFTs, status post ERCP 10/23/2021  This morning patient is found awake and alert and comfortable in his bed with his wife by his bedside.  He tells me he has not had a bowel movement since he has been here and denies any acute abdominal pain.  Apparently had a small amount of his clear liquid diet this morning and seemed to tolerate it well.   Objective   Vital signs in last 24 hours: Temp:  [97.6 F (36.4 C)-99.1 F (37.3 C)] 97.8 F (36.6 C) (02/13 0735) Pulse Rate:  [65-102] 94 (02/13 0900) Resp:  [9-31] 25 (02/13 0900) BP: (78-185)/(32-83) 96/72 (02/13 0900) SpO2:  [88 %-100 %] 92 % (02/13 0900) Last BM Date:  (PTA) General:    Elderly white male in  NAD Heart:  Regular rate and rhythm; no murmurs Lungs: Respirations even and unlabored, lungs CTA bilaterally Abdomen:  Soft, nontender and nondistended. Normal bowel sounds. Psych:  Cooperative. Normal mood and affect.  Intake/Output from previous day: 02/12 0701 - 02/13 0700 In: 4550.2 [I.V.:2095.9; IV Piggyback:2454.3] Out: 225 [Urine:225] Intake/Output this shift: Total I/O In: 308 [I.V.:250.8; IV Piggyback:57.2] Out: 50 [Urine:50]  Lab Results: Recent Labs    10/23/21 0933 10/23/21 2154 10/24/21 0411  WBC 14.8* 16.2* 11.4*  HGB 12.0* 9.5* 9.4*  HCT 38.3* 30.6* 30.2*  PLT 149* 96* 75*   BMET Recent Labs    10/23/21 0334 10/23/21 0431 10/23/21 0933 10/23/21 2154 10/24/21 0411  NA 137 139  --  136 138  K 4.9 5.1  --  4.1 3.7  CL 106  --   --  105 99  CO2 13*  --   --  14* 23  GLUCOSE 219*  --   --  108* 374*  BUN 24*  --   --  34* 37*  CREATININE 1.91*  --  2.11* 2.67* 3.19*  CALCIUM 9.1  --   --  7.6* 7.1*   Hepatic Function Latest Ref Rng & Units 10/24/2021 10/23/2021 06/07/2021  Total Protein 6.5 - 8.1 g/dL 4.8(L) 6.1(L) 7.0  Albumin 3.5 - 5.0 g/dL 2.3(L) 3.1(L) 4.3  AST 15 - 41 U/L 1,557(H) 532(H) 18  ALT 0 - 44 U/L 849(H) 235(H) 13  Alk Phosphatase 38 - 126 U/L 106 149(H) 32(L)  Total Bilirubin 0.3 - 1.2 mg/dL  3.7(H) 5.9(H) 0.9  Bilirubin, Direct 0.1 - 0.5 mg/dL - - -     PT/INR Recent Labs    10/23/21 0334  LABPROT 13.7  INR 1.1    Studies/Results: CT HEAD WO CONTRAST (5MM)  Result Date: 10/23/2021 CLINICAL DATA:  [place]ium EXAM: CT HEAD WITHOUT CONTRAST TECHNIQUE: Contiguous axial images were obtained from the base of the skull through the vertex without intravenous contrast. RADIATION DOSE REDUCTION: This exam was performed according to the departmental dose-optimization program which includes automated exposure control, adjustment of the mA and/or kV according to patient size and/or use of iterative reconstruction technique. COMPARISON:  None.  FINDINGS: Brain: No evidence of acute infarction, hemorrhage, hydrocephalus, extra-axial collection or mass lesion/mass effect. Mild cerebral volume loss and white matter low-density in keeping with age. Vascular: No hyperdense vessel or unexpected calcification. Skull: Normal. Negative for fracture or focal lesion. Sinuses/Orbits: No acute finding. IMPRESSION: No acute finding.  Unremarkable study for age. Electronically Signed   By: Jorje Guild M.D.   On: 10/23/2021 07:15   DG Chest Portable 1 View  Result Date: 10/23/2021 CLINICAL DATA:  AFib with RVR EXAM: PORTABLE CHEST 1 VIEW COMPARISON:  03/06/2018 FINDINGS: Lungs are clear.  No pleural effusion or pneumothorax. Stable mild cardiomegaly. Postsurgical changes related to prior CABG. Median sternotomy. IMPRESSION: Stable mild cardiomegaly. No evidence of acute cardiopulmonary disease. Electronically Signed   By: Julian Hy M.D.   On: 10/23/2021 03:55   DG ERCP WITH SPHINCTEROTOMY  Result Date: 10/23/2021 CLINICAL DATA:  Chest and back pain Cholelithiasis Dilated CBD on CT EXAM: ERCP TECHNIQUE: Multiple spot images obtained with the fluoroscopic device and submitted for interpretation post-procedure. FLUOROSCOPY: Radiation Exposure Index (as provided by the fluoroscopic device): 65.5 mGy Kerma COMPARISON:  None. FINDINGS: Thirteen images were submitted for interpretation. The submitted images demonstrate cannulation of the common bile duct. A filling defect is seen in the mid duct on the initial images which is no longer identified following balloon sweep. IMPRESSION: Intraoperative fluoroscopic images of ERCP as above. These images were submitted for radiologic interpretation only. Please see the procedural report for the amount of contrast and the fluoroscopy time utilized. Electronically Signed   By: Miachel Roux M.D.   On: 10/23/2021 11:45   ECHOCARDIOGRAM COMPLETE  Result Date: 10/23/2021    ECHOCARDIOGRAM REPORT   Patient Name:    [name] Young. Date of Exam: 10/23/2021 Medical Rec #:  [id number]            Height:       71.0 in Accession #:    [id number]           Weight:       230.0 lb Date of Birth:  Oct 07, 1940           BSA:          2.238 m Patient Age:    31 years             BP:           96/53 mmHg Patient Gender: M                    HR:           92 bpm. Exam Location:  Inpatient Procedure: 2D Echo Indications:    dyspnea  History:        Patient has prior history of Echocardiogram examinations, most  recent 01/18/2018. Prior CABG, Sepsis. Chronic kidney disease;                 Risk Factors:Diabetes, Dyslipidemia and Hypertension.  Sonographer:    Johny Chess RDCS Referring Phys: [place]  1. Left ventricular ejection fraction, by estimation, is 40 to 45%. The left ventricle has mildly decreased function. The left ventricle demonstrates global hypokinesis. Left ventricular diastolic parameters are consistent with Grade II diastolic dysfunction (pseudonormalization). Elevated left ventricular end-diastolic pressure.  2. Right ventricular systolic function is normal. The right ventricular size is normal. There is normal pulmonary artery systolic pressure. The estimated right ventricular systolic pressure is 32.9 mmHg.  3. Left atrial size was mildly dilated.  4. The mitral valve is degenerative. Trivial mitral valve regurgitation. No evidence of mitral stenosis.  5. The aortic valve is calcified. There is moderate calcification of the aortic valve. There is moderate thickening of the aortic valve. Aortic valve regurgitation is not visualized. Aortic valve sclerosis/calcification is present, without any evidence of aortic stenosis.  6. There is mild dilatation of the aortic root, measuring 41 mm. There is mild dilatation of the ascending aorta, measuring 38 mm.  7. The inferior vena cava is dilated in size with >50% respiratory variability, suggesting right atrial pressure of 8 mmHg.  FINDINGS  Left Ventricle: Left ventricular ejection fraction, by estimation, is 40 to 45%. The left ventricle has mildly decreased function. The left ventricle demonstrates global hypokinesis. The left ventricular internal cavity size was normal in size. There is  no left ventricular hypertrophy. Left ventricular diastolic parameters are consistent with Grade II diastolic dysfunction (pseudonormalization). Elevated left ventricular end-diastolic pressure. Right Ventricle: The right ventricular size is normal. No increase in right ventricular wall thickness. Right ventricular systolic function is normal. There is normal pulmonary artery systolic pressure. The tricuspid regurgitant velocity is 2.02 m/s, and  with an assumed right atrial pressure of 8 mmHg, the estimated right ventricular systolic pressure is 92.4 mmHg. Left Atrium: Left atrial size was mildly dilated. Right Atrium: Right atrial size was normal in size. Pericardium: There is no evidence of pericardial effusion. Mitral Valve: The mitral valve is degenerative in appearance. There is moderate thickening of the mitral valve leaflet(s). There is moderate calcification of the mitral valve leaflet(s). Mild mitral annular calcification. Trivial mitral valve regurgitation. No evidence of mitral valve stenosis. Tricuspid Valve: The tricuspid valve is normal in structure. Tricuspid valve regurgitation is trivial. No evidence of tricuspid stenosis. Aortic Valve: The aortic valve is calcified. There is moderate calcification of the aortic valve. There is moderate thickening of the aortic valve. Aortic valve regurgitation is not visualized. Aortic valve sclerosis/calcification is present, without any  evidence of aortic stenosis. Pulmonic Valve: The pulmonic valve was normal in structure. Pulmonic valve regurgitation is trivial. No evidence of pulmonic stenosis. Aorta: The aortic root is normal in size and structure. There is mild dilatation of the aortic root,  measuring 41 mm. There is mild dilatation of the ascending aorta, measuring 38 mm. Venous: The inferior vena cava is dilated in size with greater than 50% respiratory variability, suggesting right atrial pressure of 8 mmHg. IAS/Shunts: No atrial level shunt detected by color flow Doppler.  LEFT VENTRICLE PLAX 2D LVIDd:         6.30 cm      Diastology LVIDs:         4.60 cm      LV e' medial:    5.87 cm/s LV PW:  1.10 cm      LV E/e' medial:  16.7 LV IVS:        1.00 cm      LV e' lateral:   7.29 cm/s LVOT diam:     2.60 cm      LV E/e' lateral: 13.5 LV SV:         73 LV SV Index:   33 LVOT Area:     5.31 cm  LV Volumes (MOD) LV vol d, MOD A2C: 115.0 ml LV vol s, MOD A2C: 75.8 ml LV SV MOD A2C:     39.2 ml RIGHT VENTRICLE            IVC RV S prime:     9.03 cm/s  IVC diam: 2.60 cm TAPSE (M-mode): 1.3 cm LEFT ATRIUM             Index        RIGHT ATRIUM           Index LA diam:        4.30 cm 1.92 cm/m   RA Area:     17.00 cm LA Vol (A2C):   87.2 ml 38.97 ml/m  RA Volume:   43.90 ml  19.62 ml/m LA Vol (A4C):   66.9 ml 29.90 ml/m LA Biplane Vol: 77.2 ml 34.50 ml/m  AORTIC VALVE LVOT Vmax:   78.30 cm/s LVOT Vmean:  52.600 cm/s LVOT VTI:    0.138 m  AORTA Ao Root diam: 4.10 cm Ao Asc diam:  3.80 cm MITRAL VALVE               TRICUSPID VALVE MV Area (PHT): 4.49 cm    TR Peak grad:   16.3 mmHg MV Decel Time: 169 msec    TR Vmax:        202.00 cm/s MV E velocity: 98.20 cm/s MV A velocity: 83.10 cm/s  SHUNTS MV E/A ratio:  1.18        Systemic VTI:  0.14 m                            Systemic Diam: 2.60 cm Fransico Him MD Electronically signed by Fransico Him MD Signature Date/Time: 10/23/2021/4:51:21 PM    Final    CT Angio Chest/Abd/Pel for Dissection W and/or Wo Contrast  Result Date: 10/23/2021 CLINICAL DATA:  81 year old male with history of chest and back pain. Suspected aortic dissection. EXAM: CT ANGIOGRAPHY CHEST, ABDOMEN AND PELVIS TECHNIQUE: Non-contrast CT of the chest was initially obtained.  Multidetector CT imaging through the chest, abdomen and pelvis was performed using the standard protocol during bolus administration of intravenous contrast. Multiplanar reconstructed images and MIPs were obtained and reviewed to evaluate the vascular anatomy. RADIATION DOSE REDUCTION: This exam was performed according to the departmental dose-optimization program which includes automated exposure control, adjustment of the mA and/or kV according to patient size and/or use of iterative reconstruction technique. CONTRAST:  52m OMNIPAQUE IOHEXOL 350 MG/ML SOLN COMPARISON:  Chest CT 01/17/2018. FINDINGS: CTA CHEST FINDINGS Cardiovascular: Precontrast images demonstrate no crescentic high attenuation associated with the wall of the thoracic aorta to suggest the presence of acute intramural hemorrhage. Postcontrast images demonstrate no evidence of thoracic aortic aneurysm or dissection. Heart size is normal. There is no significant pericardial fluid, thickening or pericardial calcification. There is aortic atherosclerosis, as well as atherosclerosis of the great vessels of the mediastinum and the coronary arteries, including calcified atherosclerotic plaque in  the left main, left anterior descending, left circumflex and right coronary arteries. Status post median sternotomy for CABG including LIMA to the LAD. Calcifications of the aortic valve. Mediastinum/Nodes: No pathologically enlarged mediastinal or hilar lymph nodes. Esophagus is unremarkable in appearance. No axillary lymphadenopathy. Lungs/Pleura: No acute consolidative airspace disease. No pleural effusions. No definite suspicious appearing pulmonary nodules or masses are noted on today's examination which is limited by considerable patient motion. Dependent areas of subsegmental atelectasis or scarring are noted in the lung bases bilaterally. Musculoskeletal: Median sternotomy wires. There are no aggressive appearing lytic or blastic lesions noted in the  visualized portions of the skeleton. Review of the MIP images confirms the above findings. CTA ABDOMEN AND PELVIS FINDINGS VASCULAR Aorta: Normal caliber aorta without aneurysm, dissection, vasculitis or significant stenosis. Celiac: Patent without evidence of aneurysm, dissection, vasculitis or significant stenosis. SMA: Patent without evidence of aneurysm, dissection, vasculitis or significant stenosis. Renals: Both renal arteries are patent without evidence of aneurysm, dissection, vasculitis, fibromuscular dysplasia or significant stenosis. IMA: Patent without evidence of aneurysm, dissection, vasculitis or significant stenosis. Inflow: Patent without evidence of aneurysm, dissection, vasculitis or significant stenosis. Veins: No obvious venous abnormality within the limitations of this arterial phase study. Review of the MIP images confirms the above findings. NON-VASCULAR Hepatobiliary: No suspicious cystic or solid hepatic lesions are confidently identified on today's arterial phase examination. Partially calcified gallstone measuring 1.1 cm in diameter lying dependently in the gallbladder. No findings to suggest an acute cholecystitis are noted at this time. Common bile duct measures 11 mm in the porta hepatis. No calcified stones are identified in the common bile duct. No intrahepatic biliary ductal dilatation. Pancreas: No pancreatic mass. No pancreatic ductal dilatation. No pancreatic or peripancreatic fluid collections or inflammatory changes. Spleen: Unremarkable. Adrenals/Urinary Tract: 5 mm calculus at the right ureteropelvic junction (axial image 191 of series 7). No proximal hydroureteronephrosis to indicate active obstruction at this time. There is extensive perinephric stranding bilaterally (nonspecific). Low-attenuation lesions in both kidneys, compatible with simple cysts, measuring up to 4.4 cm in the lower pole of the right kidney. Urinary bladder is normal in appearance. Bilateral adrenal  glands are normal in appearance. Stomach/Bowel: Normal appearance of the stomach. No pathologic dilatation of small bowel or colon. Numerous colonic diverticulae are noted, particularly in the sigmoid colon, without definite focal surrounding inflammatory changes to clearly indicate an associated diverticulitis at this time. The appendix is not confidently identified and may be surgically absent. Regardless, there are no inflammatory changes noted adjacent to the cecum to suggest the presence of an acute appendicitis at this time. Lymphatic: No lymphadenopathy noted in the abdomen or pelvis. Reproductive: Prostate gland and seminal vesicles are unremarkable in appearance. Other: No significant volume of ascites.  No pneumoperitoneum. Musculoskeletal: There are no aggressive appearing lytic or blastic lesions noted in the visualized portions of the skeleton. Review of the MIP images confirms the above findings. IMPRESSION: 1. No evidence of acute aortic syndrome. 2. No acute findings in the thorax to account for the patient's symptoms. 3. 5 mm calculus at the right ureteropelvic junction. At this time, there is no proximal hydronephrosis to indicate urinary tract obstruction. 4. Cholelithiasis without evidence of acute cholecystitis. 5. Dilatation of the common bile duct. No intrahepatic biliary ductal dilatation to clearly indicate biliary tract obstruction. Additionally, there is no calcified choledocholithiasis. This is of uncertain etiology and significance, and could be age related, however, correlation with liver function tests is recommended. If there is clinical concern  for biliary tract obstruction, further evaluation with abdominal MRI with and without IV gadolinium with MRCP would be recommended. 6. Aortic atherosclerosis, in addition to left main and three-vessel coronary artery disease. Status post median sternotomy for CABG including LIMA to the LAD. 7. There are calcifications of the aortic valve.  Echocardiographic correlation for evaluation of potential valvular dysfunction may be warranted if clinically indicated. 8. Additional incidental findings, as above. Electronically Signed   By: Vinnie Langton M.D.   On: 10/23/2021 07:20      Assessment / Plan:   Assessment: 1.  Cholangitis with choledocholithiasis: Status post ERCP with sphincterotomy and stone extraction 10/23/2021, overnight no abdominal pain, but transaminases have increased 2.  Nonsustained V. tach 3.  History of A-fib 4.  History of CAD status post three-vessel CABG 01/2018 5.  CKD 6.  Diabetes type 2  Plan: 1.  Continue to monitor LFTs 2.  Okay for soft/low-fat diet today as long as patient is not scheduled for the procedures 3.  Continue antibiotics 4.  Avoid aspirin and NSAIDs for a week  Thank you for your kind consultation, we will continue to follow along.   LOS: 1 day   Levin Erp  10/24/2021, 10:59 AM

## 2021-10-24 NOTE — Progress Notes (Signed)
PT Cancellation Note  Patient Details Name: [name] Young. MRN: [id number] DOB: 12/21/1940   Cancelled Treatment:    Reason Eval/Treat Not Completed: (P) Medical issues which prohibited therapy Pt sundowning and needing restraints, not following commands. PT will follow back for Evaluation tomorrow.   Canuto Kingston B. Migdalia Dk PT, DPT Acute Rehabilitation Services Pager [phone] Office [phone]   [place] 10/24/2021, 4:02 PM

## 2021-10-24 NOTE — Progress Notes (Signed)
NAME:  [name] Young, [name] Young MRN:  [id number], DOB:  [birth year], LOS: 1 ADMISSION DATE:  10/23/2021, CONSULTATION DATE:  10/23/2021 REFERRING MD: Orlena Sheldon, MD CHIEF COMPLAINT:  abdominal pain   History of Present Illness:  [name] Young. Is a 81 y.o. male with a PMH significant for CAD s/p CAGB 2019 A-fib,, HTN. HLD, diabetes, GERD, and CKD who presented to the ED for complaints of abdominal pain with associated nausea and vomiting  that began the day prior to admission at approximately 1600.    On ED arrival patient was seen in A-fib RVR, hypertensive, and tachypnea. Labwork significant for creatinine 1.91, elevated LFTs, lipase 390, total bill 5.9, lactic acid 8.9. CT head negative but CTA chest/abd/pelvis reveled dilated bile duct, acute cholelithiasis without cholecystis. GI was consulted and recommended urgent MRCP to better characterize biliary system. Given sepsis criteria on admission PCCM was consulted for further management and admission.   Pertinent  Medical History  CAD s/p CAGB 2019 A-fib,, HTN. HLD, diabetes, GERD, and CKD  Significant Hospital Events: Including procedures, antibiotic start and stop dates in addition to other pertinent events   2/11 Admitted with sepsis felt secondary to GI source   Interim History / Subjective:  Patient resting in bed this morning. Abdominal pain has resolved. He felt like he needed to have bowel movement, but says he cant on bed pan. He would like to get up in chair today.   Objective   Blood pressure 116/60, pulse 92, temperature 97.8 F (36.6 C), temperature source Oral, resp. rate (!) 24, height 5' 11"  (1.803 m), weight 104.3 kg, SpO2 95 %.        Intake/Output Summary (Last 24 hours) at 10/24/2021 8309 Last data filed at 10/24/2021 0600 Gross per 24 hour  Intake 4550.22 ml  Output 225 ml  Net 4325.22 ml   Filed Weights   10/23/21 0340  Weight: 104.3 kg    Examination: General: Sitting up in bed on approach, NAD, anxious to  get out of bed into chair  HENT: antiicteric sclera Lungs: On 2 L supplemental oxygen via nasal cannula, normal work of breathing, rales left base Cardiovascular: Regular rate and rhythm Abdomen: + bowel sounds, soft nontender Extremities: Warm, no lower extremity edema Neuro: Alert, oriented to person place and time GU: External catheter in place  Labs 2/13  Mg 1.6 (L) Phos 5.8  (H) WBC 16.2 > 11.4 Hgb 9.4/HCT 30.2 , stable Platelets 149> 96 > 75 Corrected calcium 8.5  AST 532 > 1557 ALT 235 > 849 Alk phos 149 > 106 Bilirubin 5.9 > 3.7  Lactic Acid 7.3 >5.1>Trending   Micro:  Blood Cultures 2/12 : Streptococcus species , Escherichia Coli  2/12 TEE: EF 40 to 45%, global-hypokinesis of left ventricle, grade 2 diastolic dysfunction Resolved Hospital Problem list     Assessment & Plan:  Sepsis secondary to E.coli and Streptococcus species bacteremia from ascending cholangitis now s/p ERCP with two stones removed and sphincterotomy - Abdominal pain resolved  - Continue LR @125  ml/hr, if keeping up PO will stop fluids. Trend lactic acid -Continue ceftriaxone and metronidazole, leukocytosis trending down - Repeat blood cultures - Trend CBC  Oliguric AKI on CKD stage 3A Hyperphosphatemia Baseline creatinine 1.4-1.6.  - Review of CT Angio Chest/Abd/pel from yesterday showed 5 mm calculus at right ureteropelvic junction, no hydronephrosis, nl bladder.  - Creatinine continues to trend up 3.19, expect this is ATN from ischemic injury 2/2 sepsis  - IV fluids as above -  Bladder scan -Strict I/O's  Elevated Liver enzymes with hepatocellular pattern -Liver enzymes trending up this morning expect this is secondary to problem 1.  Patient also started on amiodarone for atrial fibrillation, in normal sinus rhythm this morning.  -Trend LFT's - Stop Amio  T2DM Hemoglobin A1c 6.1 on 10/23/21 -Sitagliptin and metformin at home, hold home medications in setting of AKI -Goal CBG 1 40-1  80 -Continue SSI-M  Hx of PAF - NSR - Stop Amio - Restart home Metoprolol succinate   Hx of CAD s/p CABG Hx of HTN/HLD HFmrEF - Home medications of Toprol-xl 25 mg, lisinopril 40 mg, ASA 81 mg - global hypokinesis on Echo 2/12, in setting of sepsis. Will restart GDMT as patient kidney function improves. -Holding Crestor in setting of acute liver injury   Best Practice (right click and "Reselect all SmartList Selections" daily)   Diet/type: Regular consistency (see orders) DVT prophylaxis: prophylactic heparin  GI prophylaxis: N/A Lines: N/A Foley:  N/A Code Status:  full code Last date of multidisciplinary goals of cxare discussion [x]

## 2021-10-24 NOTE — Consult Note (Signed)
[facility]    Date of Admission:  10/23/2021     Reason for Consult:  Strep and E coli bacteremia     Referring Physician: Dr Court Joy  Current antibiotics: Ceftriaxone 2/12 -- present Metronidazole 2/12 -- present  Previous antibiotics: Pip Tazo 2/12 x 1 dose   ASSESSMENT:    81 y.o. male admitted with:  # Strep species and E coli bacteremia # Cholangitis s/p ERCP # Severe sepsis due to above # Acute kidney injury # Elevated LFTs, T bili # Hx of CAD s/p CABG  RECOMMENDATIONS:    Continue ceftriaxone and metronidazole Await ID and susceptibilities.  May need TEE Repeat blood cultures Lab monitoring Will follow   Principal Problem:   Bacteremia due to Streptococcus Active Problems:   Sepsis ([place])   Calculus of bile duct with cholangitis and obstruction   Jaundice   Elevated LFTs   Hyperbilirubinemia   Bacteremia due to Escherichia coli   MEDICATIONS:    Scheduled Meds:  aspirin  81 mg Oral Daily   Chlorhexidine Gluconate Cloth  6 each Topical Q0600   heparin  5,000 Units Subcutaneous Q8H   insulin aspart  0-15 Units Subcutaneous Q4H   metoprolol succinate  25 mg Oral Daily   tamsulosin  0.4 mg Oral Daily   Continuous Infusions:  cefTRIAXone (ROCEPHIN)  IV Stopped (10/23/21 1610)   lactated ringers 150 mL/hr at 10/24/21 1003   metronidazole Stopped (10/24/21 0425)   PRN Meds:.docusate sodium, ondansetron (ZOFRAN) IV, polyethylene glycol  HPI:    [name] Young. is a 81 y.o. male with a past medical history significant for CAD status post CABG, paroxysmal A-fib, nephrolithiasis who presented with weakness, abdominal pain, and was found to have cholangitis status post ERCP with gastroenterology on 10/23/2021.  His blood cultures on admission are positive for E. coli and Streptococcus species.  He had a echocardiogram yesterday that noted calcification of the aortic valve with thickening.  There was no obvious valvular  vegetations.  We have been consulted for further antibiotic recommendations.   Past Medical History:  Diagnosis Date   Arthritis    Cataract    lens implant bilateral   Chronic kidney disease    kidney stones   Coronary artery disease    Dyspnea    Dysrhythmia    GERD (gastroesophageal reflux disease)    History of cholelithiasis    History of kidney stones    ca ox [name] @ [facility]) now [facility]   History of pneumonia    HLD (hyperlipidemia)    HTN (hypertension)    Jaundice    age 3   Pneumonia    years ago    T2DM (type 2 diabetes mellitus) ([place]) 2010    Social History   Tobacco Use   Smoking status: Never   Smokeless tobacco: Former    Types: Chew    Quit date: 09/08/2001  Vaping Use   Vaping Use: Never used  Substance Use Topics   Alcohol use: Yes    Comment: occ   Drug use: No    Family History  Problem Relation Age of Onset   Alzheimer's disease Mother    Breast cancer Mother        breast   Atrial fibrillation Mother    CAD Mother    Stroke Father 67   Diabetes Father    CAD Father    Colon polyps Sister    Colon polyps Brother  Rectal cancer Maternal Grandfather        rectal   Colon cancer Maternal Grandfather 88   Esophageal cancer Neg Hx    Stomach cancer Neg Hx     No Known Allergies  Review of Systems  Constitutional:  Positive for malaise/fatigue. Negative for fever.  Respiratory: Negative.    Cardiovascular: Negative.   Gastrointestinal:  Positive for abdominal pain.  Musculoskeletal: Negative.   Skin:        + jaundice  All other systems reviewed and are negative.  OBJECTIVE:   Blood pressure (!) 128/55, pulse 94, temperature 97.7 F (36.5 C), temperature source Oral, resp. rate (!) 25, height 5\' 11"  (1.803 m), weight 104.3 kg, SpO2 92 %. Body mass index is 32.08 kg/m.  Physical Exam Constitutional:      General: He is not in acute distress.    Appearance: Normal appearance. He is ill-appearing.  HENT:      Head: Normocephalic and atraumatic.     Mouth/Throat:     Comments:  Dentition is poor.  Eyes:     General: Scleral icterus present.     Extraocular Movements: Extraocular movements intact.  Cardiovascular:     Rate and Rhythm: Normal rate and regular rhythm.     Heart sounds: No murmur heard. Pulmonary:     Effort: Pulmonary effort is normal. No respiratory distress.  Abdominal:     General: There is distension.     Palpations: Abdomen is soft.     Tenderness: There is no abdominal tenderness. There is no guarding or rebound.  Musculoskeletal:        General: Normal range of motion.     Cervical back: Normal range of motion and neck supple.  Skin:    General: Skin is warm and dry.     Coloration: Skin is not jaundiced.     Findings: No rash.  Neurological:     Mental Status: He is alert.  Psychiatric:        Mood and Affect: Mood normal.        Behavior: Behavior normal.     Lab Results: Lab Results  Component Value Date   WBC 11.4 (H) 10/24/2021   HGB 9.4 (L) 10/24/2021   HCT 30.2 (L) 10/24/2021   MCV 92.4 10/24/2021   PLT 75 (L) 10/24/2021    Lab Results  Component Value Date   NA 138 10/24/2021   K 3.7 10/24/2021   CO2 23 10/24/2021   GLUCOSE 374 (H) 10/24/2021   BUN 37 (H) 10/24/2021   CREATININE 3.19 (H) 10/24/2021   CALCIUM 7.1 (L) 10/24/2021   GFRNONAA 19 (L) 10/24/2021   GFRAA 55 (L) 09/29/2020    Lab Results  Component Value Date   ALT 849 (H) 10/24/2021   AST 1,557 (H) 10/24/2021   ALKPHOS 106 10/24/2021   BILITOT 3.7 (H) 10/24/2021    No results found for: CRP  No results found for: ESRSEDRATE  I have reviewed the micro and lab results in Epic.  Imaging: CT HEAD WO CONTRAST (5MM)  Result Date: 10/23/2021 CLINICAL DATA:  [place] EXAM: CT HEAD WITHOUT CONTRAST TECHNIQUE: Contiguous axial images were obtained from the base of the skull through the vertex without intravenous contrast. RADIATION DOSE REDUCTION: This exam was performed  according to the departmental dose-optimization program which includes automated exposure control, adjustment of the mA and/or kV according to patient size and/or use of iterative reconstruction technique. COMPARISON:  None. FINDINGS: Brain: No evidence of acute infarction, hemorrhage,  hydrocephalus, extra-axial collection or mass lesion/mass effect. Mild cerebral volume loss and white matter low-density in keeping with age. Vascular: No hyperdense vessel or unexpected calcification. Skull: Normal. Negative for fracture or focal lesion. Sinuses/Orbits: No acute finding. IMPRESSION: No acute finding.  Unremarkable study for age. Electronically Signed   By: Jorje Guild M.D.   On: 10/23/2021 07:15   DG Chest Portable 1 View  Result Date: 10/23/2021 CLINICAL DATA:  AFib with RVR EXAM: PORTABLE CHEST 1 VIEW COMPARISON:  03/06/2018 FINDINGS: Lungs are clear.  No pleural effusion or pneumothorax. Stable mild cardiomegaly. Postsurgical changes related to prior CABG. Median sternotomy. IMPRESSION: Stable mild cardiomegaly. No evidence of acute cardiopulmonary disease. Electronically Signed   By: Julian Hy M.D.   On: 10/23/2021 03:55   DG ERCP WITH SPHINCTEROTOMY  Result Date: 10/23/2021 CLINICAL DATA:  Chest and back pain Cholelithiasis Dilated CBD on CT EXAM: ERCP TECHNIQUE: Multiple spot images obtained with the fluoroscopic device and submitted for interpretation post-procedure. FLUOROSCOPY: Radiation Exposure Index (as provided by the fluoroscopic device): 65.5 mGy Kerma COMPARISON:  None. FINDINGS: Thirteen images were submitted for interpretation. The submitted images demonstrate cannulation of the common bile duct. A filling defect is seen in the mid duct on the initial images which is no longer identified following balloon sweep. IMPRESSION: Intraoperative fluoroscopic images of ERCP as above. These images were submitted for radiologic interpretation only. Please see the procedural report for the  amount of contrast and the fluoroscopy time utilized. Electronically Signed   By: Miachel Roux M.D.   On: 10/23/2021 11:45   ECHOCARDIOGRAM COMPLETE  Result Date: 10/23/2021    ECHOCARDIOGRAM REPORT   Patient Name:   [name] Young. Date of Exam: 10/23/2021 Medical Rec #:  [id number]            Height:       71.0 in Accession #:    [id number]           Weight:       230.0 lb Date of Birth:  Jun 29, 1941           BSA:          2.238 m Patient Age:    23 years             BP:           96/53 mmHg Patient Gender: M                    HR:           92 bpm. Exam Location:  Inpatient Procedure: 2D Echo Indications:    dyspnea  History:        Patient has prior history of Echocardiogram examinations, most                 recent 01/18/2018. Prior CABG, Sepsis. Chronic kidney disease;                 Risk Factors:Diabetes, Dyslipidemia and Hypertension.  Sonographer:    Johny Chess RDCS Referring Phys: [place]  1. Left ventricular ejection fraction, by estimation, is 40 to 45%. The left ventricle has mildly decreased function. The left ventricle demonstrates global hypokinesis. Left ventricular diastolic parameters are consistent with Grade II diastolic dysfunction (pseudonormalization). Elevated left ventricular end-diastolic pressure.  2. Right ventricular systolic function is normal. The right ventricular size is normal. There is normal pulmonary artery systolic pressure. The estimated right ventricular systolic pressure is 24.3  mmHg.  3. Left atrial size was mildly dilated.  4. The mitral valve is degenerative. Trivial mitral valve regurgitation. No evidence of mitral stenosis.  5. The aortic valve is calcified. There is moderate calcification of the aortic valve. There is moderate thickening of the aortic valve. Aortic valve regurgitation is not visualized. Aortic valve sclerosis/calcification is present, without any evidence of aortic stenosis.  6. There is mild dilatation of the  aortic root, measuring 41 mm. There is mild dilatation of the ascending aorta, measuring 38 mm.  7. The inferior vena cava is dilated in size with >50% respiratory variability, suggesting right atrial pressure of 8 mmHg. FINDINGS  Left Ventricle: Left ventricular ejection fraction, by estimation, is 40 to 45%. The left ventricle has mildly decreased function. The left ventricle demonstrates global hypokinesis. The left ventricular internal cavity size was normal in size. There is  no left ventricular hypertrophy. Left ventricular diastolic parameters are consistent with Grade II diastolic dysfunction (pseudonormalization). Elevated left ventricular end-diastolic pressure. Right Ventricle: The right ventricular size is normal. No increase in right ventricular wall thickness. Right ventricular systolic function is normal. There is normal pulmonary artery systolic pressure. The tricuspid regurgitant velocity is 2.02 m/s, and  with an assumed right atrial pressure of 8 mmHg, the estimated right ventricular systolic pressure is 02.4 mmHg. Left Atrium: Left atrial size was mildly dilated. Right Atrium: Right atrial size was normal in size. Pericardium: There is no evidence of pericardial effusion. Mitral Valve: The mitral valve is degenerative in appearance. There is moderate thickening of the mitral valve leaflet(s). There is moderate calcification of the mitral valve leaflet(s). Mild mitral annular calcification. Trivial mitral valve regurgitation. No evidence of mitral valve stenosis. Tricuspid Valve: The tricuspid valve is normal in structure. Tricuspid valve regurgitation is trivial. No evidence of tricuspid stenosis. Aortic Valve: The aortic valve is calcified. There is moderate calcification of the aortic valve. There is moderate thickening of the aortic valve. Aortic valve regurgitation is not visualized. Aortic valve sclerosis/calcification is present, without any  evidence of aortic stenosis. Pulmonic Valve: The  pulmonic valve was normal in structure. Pulmonic valve regurgitation is trivial. No evidence of pulmonic stenosis. Aorta: The aortic root is normal in size and structure. There is mild dilatation of the aortic root, measuring 41 mm. There is mild dilatation of the ascending aorta, measuring 38 mm. Venous: The inferior vena cava is dilated in size with greater than 50% respiratory variability, suggesting right atrial pressure of 8 mmHg. IAS/Shunts: No atrial level shunt detected by color flow Doppler.  LEFT VENTRICLE PLAX 2D LVIDd:         6.30 cm      Diastology LVIDs:         4.60 cm      LV e' medial:    5.87 cm/s LV PW:         1.10 cm      LV E/e' medial:  16.7 LV IVS:        1.00 cm      LV e' lateral:   7.29 cm/s LVOT diam:     2.60 cm      LV E/e' lateral: 13.5 LV SV:         73 LV SV Index:   33 LVOT Area:     5.31 cm  LV Volumes (MOD) LV vol d, MOD A2C: 115.0 ml LV vol s, MOD A2C: 75.8 ml LV SV MOD A2C:     39.2 ml RIGHT VENTRICLE  IVC RV S prime:     9.03 cm/s  IVC diam: 2.60 cm TAPSE (M-mode): 1.3 cm LEFT ATRIUM             Index        RIGHT ATRIUM           Index LA diam:        4.30 cm 1.92 cm/m   RA Area:     17.00 cm LA Vol (A2C):   87.2 ml 38.97 ml/m  RA Volume:   43.90 ml  19.62 ml/m LA Vol (A4C):   66.9 ml 29.90 ml/m LA Biplane Vol: 77.2 ml 34.50 ml/m  AORTIC VALVE LVOT Vmax:   78.30 cm/s LVOT Vmean:  52.600 cm/s LVOT VTI:    0.138 m  AORTA Ao Root diam: 4.10 cm Ao Asc diam:  3.80 cm MITRAL VALVE               TRICUSPID VALVE MV Area (PHT): 4.49 cm    TR Peak grad:   16.3 mmHg MV Decel Time: 169 msec    TR Vmax:        202.00 cm/s MV E velocity: 98.20 cm/s MV A velocity: 83.10 cm/s  SHUNTS MV E/A ratio:  1.18        Systemic VTI:  0.14 m                            Systemic Diam: 2.60 cm Fransico Him MD Electronically signed by Fransico Him MD Signature Date/Time: 10/23/2021/4:51:21 PM    Final    CT Angio Chest/Abd/Pel for Dissection W and/or Wo Contrast  Result Date:  10/23/2021 CLINICAL DATA:  81 year old male with history of chest and back pain. Suspected aortic dissection. EXAM: CT ANGIOGRAPHY CHEST, ABDOMEN AND PELVIS TECHNIQUE: Non-contrast CT of the chest was initially obtained. Multidetector CT imaging through the chest, abdomen and pelvis was performed using the standard protocol during bolus administration of intravenous contrast. Multiplanar reconstructed images and MIPs were obtained and reviewed to evaluate the vascular anatomy. RADIATION DOSE REDUCTION: This exam was performed according to the departmental dose-optimization program which includes automated exposure control, adjustment of the mA and/or kV according to patient size and/or use of iterative reconstruction technique. CONTRAST:  27mL OMNIPAQUE IOHEXOL 350 MG/ML SOLN COMPARISON:  Chest CT 01/17/2018. FINDINGS: CTA CHEST FINDINGS Cardiovascular: Precontrast images demonstrate no crescentic high attenuation associated with the wall of the thoracic aorta to suggest the presence of acute intramural hemorrhage. Postcontrast images demonstrate no evidence of thoracic aortic aneurysm or dissection. Heart size is normal. There is no significant pericardial fluid, thickening or pericardial calcification. There is aortic atherosclerosis, as well as atherosclerosis of the great vessels of the mediastinum and the coronary arteries, including calcified atherosclerotic plaque in the left main, left anterior descending, left circumflex and right coronary arteries. Status post median sternotomy for CABG including LIMA to the LAD. Calcifications of the aortic valve. Mediastinum/Nodes: No pathologically enlarged mediastinal or hilar lymph nodes. Esophagus is unremarkable in appearance. No axillary lymphadenopathy. Lungs/Pleura: No acute consolidative airspace disease. No pleural effusions. No definite suspicious appearing pulmonary nodules or masses are noted on today's examination which is limited by considerable patient  motion. Dependent areas of subsegmental atelectasis or scarring are noted in the lung bases bilaterally. Musculoskeletal: Median sternotomy wires. There are no aggressive appearing lytic or blastic lesions noted in the visualized portions of the skeleton. Review of the MIP images confirms the above  findings. CTA ABDOMEN AND PELVIS FINDINGS VASCULAR Aorta: Normal caliber aorta without aneurysm, dissection, vasculitis or significant stenosis. Celiac: Patent without evidence of aneurysm, dissection, vasculitis or significant stenosis. SMA: Patent without evidence of aneurysm, dissection, vasculitis or significant stenosis. Renals: Both renal arteries are patent without evidence of aneurysm, dissection, vasculitis, fibromuscular dysplasia or significant stenosis. IMA: Patent without evidence of aneurysm, dissection, vasculitis or significant stenosis. Inflow: Patent without evidence of aneurysm, dissection, vasculitis or significant stenosis. Veins: No obvious venous abnormality within the limitations of this arterial phase study. Review of the MIP images confirms the above findings. NON-VASCULAR Hepatobiliary: No suspicious cystic or solid hepatic lesions are confidently identified on today's arterial phase examination. Partially calcified gallstone measuring 1.1 cm in diameter lying dependently in the gallbladder. No findings to suggest an acute cholecystitis are noted at this time. Common bile duct measures 11 mm in the porta hepatis. No calcified stones are identified in the common bile duct. No intrahepatic biliary ductal dilatation. Pancreas: No pancreatic mass. No pancreatic ductal dilatation. No pancreatic or peripancreatic fluid collections or inflammatory changes. Spleen: Unremarkable. Adrenals/Urinary Tract: 5 mm calculus at the right ureteropelvic junction (axial image 191 of series 7). No proximal hydroureteronephrosis to indicate active obstruction at this time. There is extensive perinephric stranding  bilaterally (nonspecific). Low-attenuation lesions in both kidneys, compatible with simple cysts, measuring up to 4.4 cm in the lower pole of the right kidney. Urinary bladder is normal in appearance. Bilateral adrenal glands are normal in appearance. Stomach/Bowel: Normal appearance of the stomach. No pathologic dilatation of small bowel or colon. Numerous colonic diverticulae are noted, particularly in the sigmoid colon, without definite focal surrounding inflammatory changes to clearly indicate an associated diverticulitis at this time. The appendix is not confidently identified and may be surgically absent. Regardless, there are no inflammatory changes noted adjacent to the cecum to suggest the presence of an acute appendicitis at this time. Lymphatic: No lymphadenopathy noted in the abdomen or pelvis. Reproductive: Prostate gland and seminal vesicles are unremarkable in appearance. Other: No significant volume of ascites.  No pneumoperitoneum. Musculoskeletal: There are no aggressive appearing lytic or blastic lesions noted in the visualized portions of the skeleton. Review of the MIP images confirms the above findings. IMPRESSION: 1. No evidence of acute aortic syndrome. 2. No acute findings in the thorax to account for the patient's symptoms. 3. 5 mm calculus at the right ureteropelvic junction. At this time, there is no proximal hydronephrosis to indicate urinary tract obstruction. 4. Cholelithiasis without evidence of acute cholecystitis. 5. Dilatation of the common bile duct. No intrahepatic biliary ductal dilatation to clearly indicate biliary tract obstruction. Additionally, there is no calcified choledocholithiasis. This is of uncertain etiology and significance, and could be age related, however, correlation with liver function tests is recommended. If there is clinical concern for biliary tract obstruction, further evaluation with abdominal MRI with and without IV gadolinium with MRCP would be  recommended. 6. Aortic atherosclerosis, in addition to left main and three-vessel coronary artery disease. Status post median sternotomy for CABG including LIMA to the LAD. 7. There are calcifications of the aortic valve. Echocardiographic correlation for evaluation of potential valvular dysfunction may be warranted if clinically indicated. 8. Additional incidental findings, as above. Electronically Signed   By: Vinnie Langton M.D.   On: 10/23/2021 07:20     Imaging independently reviewed in Epic.  [facility] [phone] pager 10/24/2021, 12:48 PM

## 2021-10-24 NOTE — Progress Notes (Signed)
Patient noted to have increased confusion by RN. Patient is restless and will not stay in chair or in bed without asking to move. He is weak and a risk for fall. When I came to bedside patient was trying to get out of bed. He would not take instruction by the RN to sit back in bed. Patient was given 1 mg of ativan and required soft restraints.

## 2021-10-25 DIAGNOSIS — I4891 Unspecified atrial fibrillation: Secondary | ICD-10-CM

## 2021-10-25 DIAGNOSIS — R7401 Elevation of levels of liver transaminase levels: Secondary | ICD-10-CM

## 2021-10-25 DIAGNOSIS — N179 Acute kidney failure, unspecified: Secondary | ICD-10-CM | POA: Diagnosis not present

## 2021-10-25 DIAGNOSIS — B9620 Unspecified Escherichia coli [E. coli] as the cause of diseases classified elsewhere: Secondary | ICD-10-CM | POA: Diagnosis not present

## 2021-10-25 DIAGNOSIS — G9341 Metabolic encephalopathy: Secondary | ICD-10-CM | POA: Diagnosis not present

## 2021-10-25 DIAGNOSIS — I2510 Atherosclerotic heart disease of native coronary artery without angina pectoris: Secondary | ICD-10-CM | POA: Diagnosis not present

## 2021-10-25 DIAGNOSIS — R7881 Bacteremia: Secondary | ICD-10-CM | POA: Diagnosis not present

## 2021-10-25 DIAGNOSIS — I480 Paroxysmal atrial fibrillation: Secondary | ICD-10-CM

## 2021-10-25 DIAGNOSIS — K8033 Calculus of bile duct with acute cholangitis with obstruction: Secondary | ICD-10-CM | POA: Diagnosis not present

## 2021-10-25 DIAGNOSIS — R7989 Other specified abnormal findings of blood chemistry: Secondary | ICD-10-CM | POA: Diagnosis not present

## 2021-10-25 DIAGNOSIS — B955 Unspecified streptococcus as the cause of diseases classified elsewhere: Secondary | ICD-10-CM | POA: Diagnosis not present

## 2021-10-25 LAB — CBC WITH DIFFERENTIAL/PLATELET
Abs Immature Granulocytes: 0.09 10*3/uL — ABNORMAL HIGH (ref 0.00–0.07)
Basophils Absolute: 0 10*3/uL (ref 0.0–0.1)
Basophils Relative: 0 %
Eosinophils Absolute: 0 10*3/uL (ref 0.0–0.5)
Eosinophils Relative: 0 %
HCT: 33.7 % — ABNORMAL LOW (ref 39.0–52.0)
Hemoglobin: 11.1 g/dL — ABNORMAL LOW (ref 13.0–17.0)
Immature Granulocytes: 1 %
Lymphocytes Relative: 4 %
Lymphs Abs: 0.4 10*3/uL — ABNORMAL LOW (ref 0.7–4.0)
MCH: 29.4 pg (ref 26.0–34.0)
MCHC: 32.9 g/dL (ref 30.0–36.0)
MCV: 89.2 fL (ref 80.0–100.0)
Monocytes Absolute: 0.4 10*3/uL (ref 0.1–1.0)
Monocytes Relative: 4 %
Neutro Abs: 8.4 10*3/uL — ABNORMAL HIGH (ref 1.7–7.7)
Neutrophils Relative %: 91 %
Platelets: 63 10*3/uL — ABNORMAL LOW (ref 150–400)
RBC: 3.78 MIL/uL — ABNORMAL LOW (ref 4.22–5.81)
RDW: 15.8 % — ABNORMAL HIGH (ref 11.5–15.5)
WBC: 9.3 10*3/uL (ref 4.0–10.5)
nRBC: 0 % (ref 0.0–0.2)

## 2021-10-25 LAB — COMPREHENSIVE METABOLIC PANEL
ALT: 905 U/L — ABNORMAL HIGH (ref 0–44)
AST: 1104 U/L — ABNORMAL HIGH (ref 15–41)
Albumin: 2.6 g/dL — ABNORMAL LOW (ref 3.5–5.0)
Alkaline Phosphatase: 150 U/L — ABNORMAL HIGH (ref 38–126)
Anion gap: 17 — ABNORMAL HIGH (ref 5–15)
BUN: 57 mg/dL — ABNORMAL HIGH (ref 8–23)
CO2: 21 mmol/L — ABNORMAL LOW (ref 22–32)
Calcium: 8.4 mg/dL — ABNORMAL LOW (ref 8.9–10.3)
Chloride: 102 mmol/L (ref 98–111)
Creatinine, Ser: 4.42 mg/dL — ABNORMAL HIGH (ref 0.61–1.24)
GFR, Estimated: 13 mL/min — ABNORMAL LOW (ref >60)
Glucose, Bld: 96 mg/dL (ref 70–99)
Potassium: 4.4 mmol/L (ref 3.5–5.1)
Sodium: 140 mmol/L (ref 135–145)
Total Bilirubin: 3.5 mg/dL — ABNORMAL HIGH (ref 0.3–1.2)
Total Protein: 5.6 g/dL — ABNORMAL LOW (ref 6.5–8.1)

## 2021-10-25 LAB — CULTURE, BLOOD (ROUTINE X 2)
Special Requests: ADEQUATE
Special Requests: ADEQUATE

## 2021-10-25 LAB — MAGNESIUM: Magnesium: 2.5 mg/dL — ABNORMAL HIGH (ref 1.7–2.4)

## 2021-10-25 LAB — GLUCOSE, CAPILLARY
Glucose-Capillary: 111 mg/dL — ABNORMAL HIGH (ref 70–99)
Glucose-Capillary: 122 mg/dL — ABNORMAL HIGH (ref 70–99)
Glucose-Capillary: 113 mg/dL — ABNORMAL HIGH (ref 70–99)
Glucose-Capillary: 107 mg/dL — ABNORMAL HIGH (ref 70–99)
Glucose-Capillary: 128 mg/dL — ABNORMAL HIGH (ref 70–99)
Glucose-Capillary: 120 mg/dL — ABNORMAL HIGH (ref 70–99)

## 2021-10-25 LAB — HEPATITIS PANEL, ACUTE
HCV Ab: NONREACTIVE
Hep A IgM: NONREACTIVE
Hep B C IgM: NONREACTIVE
Hepatitis B Surface Ag: NONREACTIVE

## 2021-10-25 LAB — LACTIC ACID, PLASMA: Lactic Acid, Venous: 3 mmol/L (ref 0.5–1.9)

## 2021-10-25 LAB — HEPARIN LEVEL (UNFRACTIONATED): Heparin Unfractionated: 0.17 IU/mL — ABNORMAL LOW (ref 0.30–0.70)

## 2021-10-25 MED ORDER — AMIODARONE HCL IN DEXTROSE 360-4.14 MG/200ML-% IV SOLN
30.0000 mg/h | INTRAVENOUS | Status: DC
  Filled 2021-10-25: qty 200

## 2021-10-25 MED ORDER — ORAL CARE MOUTH RINSE
15.0000 mL | Freq: Two times a day (BID) | OROMUCOSAL | Status: DC
  Administered 2021-10-25 – 2021-11-02 (×12): 15 mL via OROMUCOSAL

## 2021-10-25 MED ORDER — METOPROLOL SUCCINATE ER 50 MG PO TB24: 50.0000 mg | ORAL_TABLET | Freq: Every day | ORAL | Status: DC

## 2021-10-25 MED ORDER — CHLORHEXIDINE GLUCONATE 0.12 % MT SOLN
15.0000 mL | Freq: Two times a day (BID) | OROMUCOSAL | Status: DC
  Administered 2021-10-25 – 2021-11-03 (×19): 15 mL via OROMUCOSAL
  Filled 2021-10-25 (×18): qty 15

## 2021-10-25 MED ORDER — HEPARIN BOLUS VIA INFUSION
2000.0000 [IU] | Freq: Once | INTRAVENOUS | Status: AC
  Administered 2021-10-25: 2000 [IU] via INTRAVENOUS
  Filled 2021-10-25: qty 2000

## 2021-10-25 MED ORDER — SODIUM CHLORIDE 0.9 % IV SOLN
3.0000 g | Freq: Two times a day (BID) | INTRAVENOUS | Status: DC
  Administered 2021-10-25 – 2021-10-27 (×4): 3 g via INTRAVENOUS
  Filled 2021-10-25 (×4): qty 8

## 2021-10-25 MED ORDER — SODIUM CHLORIDE 0.9 % IV SOLN: 3.0000 g | Freq: Two times a day (BID) | INTRAVENOUS | Status: DC

## 2021-10-25 MED ORDER — AMIODARONE HCL IN DEXTROSE 360-4.14 MG/200ML-% IV SOLN
30.0000 mg/h | INTRAVENOUS | Status: DC
  Administered 2021-10-25 – 2021-10-27 (×4): 30 mg/h via INTRAVENOUS
  Filled 2021-10-25 (×3): qty 200

## 2021-10-25 MED ORDER — HEPARIN (PORCINE) 25000 UT/250ML-% IV SOLN
2600.0000 [IU]/h | INTRAVENOUS | Status: DC
  Administered 2021-10-25: 1700 [IU]/h via INTRAVENOUS
  Administered 2021-10-25: 1400 [IU]/h via INTRAVENOUS
  Administered 2021-10-26: 2000 [IU]/h via INTRAVENOUS
  Administered 2021-10-27: 2600 [IU]/h via INTRAVENOUS
  Filled 2021-10-25 (×5): qty 250

## 2021-10-25 MED ORDER — SODIUM CHLORIDE 0.9 % IV SOLN: INTRAVENOUS | Status: AC

## 2021-10-25 MED ORDER — ALBUMIN HUMAN 25 % IV SOLN
25.0000 g | Freq: Four times a day (QID) | INTRAVENOUS | Status: AC
  Administered 2021-10-25 – 2021-10-26 (×3): 25 g via INTRAVENOUS
  Filled 2021-10-25 (×3): qty 100

## 2021-10-25 MED ORDER — DILTIAZEM HCL-DEXTROSE 125-5 MG/125ML-% IV SOLN (PREMIX)
5.0000 mg/h | INTRAVENOUS | Status: DC
  Administered 2021-10-25: 5 mg/h via INTRAVENOUS
  Filled 2021-10-25: qty 125

## 2021-10-25 MED ORDER — METOPROLOL TARTRATE 5 MG/5ML IV SOLN
5.0000 mg | Freq: Four times a day (QID) | INTRAVENOUS | Status: DC
  Administered 2021-10-25 (×2): 5 mg via INTRAVENOUS
  Filled 2021-10-25 (×2): qty 5

## 2021-10-25 NOTE — Progress Notes (Signed)
ANTICOAGULATION CONSULT NOTE - Initial Consult  Pharmacy Consult for Heparin Indication: atrial fibrillation  No Known Allergies  Patient Measurements: Height: 5\' 11"  (180.3 cm) Weight: 118.2 kg (260 lb 9.3 oz) IBW/kg (Calculated) : 75.3 Heparin Dosing Weight: 100 kg  Vital Signs: Temp: 98.6 F (37 C) (02/14 0300) Temp Source: Axillary (02/14 0300) BP: 153/86 (02/14 0500) Pulse Rate: 87 (02/14 0500)  Labs: Recent Labs    10/23/21 0334 10/23/21 0431 10/23/21 0559 10/23/21 0933 10/23/21 2154 10/24/21 0411 10/24/21 2226 10/25/21 0029  HGB 12.9*   < >  --    < > 9.5* 9.4* 10.9* 11.1*  HCT 40.6   < >  --    < > 30.6* 30.2* 32.0* 33.7*  PLT 193  --   --    < > 96* 75*  --  63*  LABPROT 13.7  --   --   --   --   --   --   --   INR 1.1  --   --   --   --   --   --   --   CREATININE 1.91*  --   --    < > 2.67* 3.19*  --  4.42*  TROPONINIHS 68*  --  142*  --   --   --   --   --    < > = values in this interval not displayed.    Estimated Creatinine Clearance: 17.7 mL/min (A) (by C-G formula based on SCr of 4.42 mg/dL (H)).   Medical History: Past Medical History:  Diagnosis Date   Arthritis    Cataract    lens implant bilateral   Chronic kidney disease    kidney stones   Coronary artery disease    Dyspnea    Dysrhythmia    GERD (gastroesophageal reflux disease)    History of cholelithiasis    History of kidney stones    ca ox [name] @ [facility]) now [facility]   History of pneumonia    HLD (hyperlipidemia)    HTN (hypertension)    Jaundice    age 62   Pneumonia    years ago    T2DM (type 2 diabetes mellitus) ([place]) 2010    Medications:  Scheduled:   chlorhexidine  15 mL Mouth Rinse BID   Chlorhexidine Gluconate Cloth  6 each Topical Q0600   insulin aspart  0-15 Units Subcutaneous Q4H   mouth rinse  15 mL Mouth Rinse q12n4p   metoprolol succinate  25 mg Oral Daily   tamsulosin  0.4 mg Oral Daily    Assessment: 81 y.o. male admitted with  cholangitis/sepsis, now with Afib, for heparin Goal of Therapy:  Heparin level 0.3-0.7 units/ml Monitor platelets by anticoagulation protocol: Yes   Plan:  Heparin 2000 units IV bolus, then start heparin 1400 units/hr Check heparin level in 8 hours.   Caryl Pina 10/25/2021,6:12 AM

## 2021-10-25 NOTE — Assessment & Plan Note (Addendum)
Secondary to cholangitis, choledocholithiasis, sepsis.  Continue monitoring

## 2021-10-25 NOTE — Progress Notes (Signed)
ANTICOAGULATION CONSULT NOTE - Initial Consult  Pharmacy Consult for Heparin Indication: atrial fibrillation  No Known Allergies  Patient Measurements: Height: 5\' 11"  (180.3 cm) Weight: 118.2 kg (260 lb 9.3 oz) IBW/kg (Calculated) : 75.3 Heparin Dosing Weight: 100 kg  Vital Signs: Temp: 98.6 F (37 C) (02/14 1100) Temp Source: Axillary (02/14 1100) BP: 111/62 (02/14 0900) Pulse Rate: 111 (02/14 0900)  Labs: Recent Labs    10/23/21 0334 10/23/21 0431 10/23/21 0559 10/23/21 0933 10/23/21 2154 10/24/21 0411 10/24/21 2226 10/25/21 0029 10/25/21 1448  HGB 12.9*   < >  --    < > 9.5* 9.4* 10.9* 11.1*  --   HCT 40.6   < >  --    < > 30.6* 30.2* 32.0* 33.7*  --   PLT 193  --   --    < > 96* 75*  --  63*  --   LABPROT 13.7  --   --   --   --   --   --   --   --   INR 1.1  --   --   --   --   --   --   --   --   HEPARINUNFRC  --   --   --   --   --   --   --   --  0.17*  CREATININE 1.91*  --   --    < > 2.67* 3.19*  --  4.42*  --   TROPONINIHS 68*  --  142*  --   --   --   --   --   --    < > = values in this interval not displayed.     Estimated Creatinine Clearance: 17.7 mL/min (A) (by C-G formula based on SCr of 4.42 mg/dL (H)).   Medical History: Past Medical History:  Diagnosis Date   Arthritis    Cataract    lens implant bilateral   Chronic kidney disease    kidney stones   Coronary artery disease    Dyspnea    Dysrhythmia    GERD (gastroesophageal reflux disease)    History of cholelithiasis    History of kidney stones    ca ox [name] @ [facility]) now [employer]   History of pneumonia    HLD (hyperlipidemia)    HTN (hypertension)    Jaundice    age 69   Pneumonia    years ago    T2DM (type 2 diabetes mellitus) ([place]) 2010    Medications:  Scheduled:   chlorhexidine  15 mL Mouth Rinse BID   Chlorhexidine Gluconate Cloth  6 each Topical Q0600   insulin aspart  0-15 Units Subcutaneous Q4H   mouth rinse  15 mL Mouth Rinse q12n4p   metoprolol  tartrate  5 mg Intravenous Q6H   tamsulosin  0.4 mg Oral Daily    Assessment: 81 y.o. male admitted with cholangitis/sepsis, now with Afib, for heparin  Heparin level returned below goal at 0.17. Per RN no issues during infusion and no signs or symptoms of bleeding.   Goal of Therapy:  Heparin level 0.3-0.7 units/ml Monitor platelets by anticoagulation protocol: Yes   Plan:  Increase heparin infusion to 1700 units/h Check heparin level in 8 hours Monitor CBC and sign/symptoms of bleeding  Lestine Box, PharmD PGY2 Infectious Diseases Pharmacy Resident   Please check [email or web address] for unit-specific pharmacy phone numbers

## 2021-10-25 NOTE — Assessment & Plan Note (Signed)
Presented with elevated liver enzymes.  Imagings showed cholelithiasis, dilated CBD.  S/p ERCP on 2/12 with esophagectomy and balloon sweeps with removal of 2 stones, sludge and pus.  Currently on ceftriaxone.

## 2021-10-25 NOTE — Hospital Course (Signed)
Patient is a 81 year old male with history of nephrolithiasis, coronary artery disease status post CABG, paroxysmal A-fib who presented initially with weakness, lower abdominal pain.  On presentation lab work showed lactic acidosis, AKI, leukocytosis, severely elevated liver enzymes.  CT imaging showed cholelithiasis without evidence of acute cholecystitis,dilatation of the common bile duct.  GI consulted, underwent ERCP.  Hospital course remarkable for worsening kidney function, A-fib with RVR, bacteremia with E. coli and gram-positive cocci, metabolic encephalopathy.  Cardiology, GI, ID ,  Nephrology following.  Patient transferred to our service on 10/25/2021 from [facility]

## 2021-10-25 NOTE — Progress Notes (Signed)
OT Cancellation Note  Patient Details Name: [name] Young. MRN: [id number] DOB: March 04, 1941   Cancelled Treatment:    Reason Eval/Treat Not Completed: Fatigue/lethargy limiting ability to participate Checked back this PM with pt still lethargic, sleeping deeply on entry. Will hold today and follow-up tomorrow for OT eval.  Layla Maw 10/25/2021, 12:27 PM

## 2021-10-25 NOTE — Progress Notes (Addendum)
[facility] for Infectious Disease  Date of Admission:  10/23/2021           Reason for visit: Follow up on bacteremia  Current antibiotics: Ceftriaxone 2/12 -- present Metronidazole 2/12 -- present   Previous antibiotics: Pip Tazo 2/12 x 1 dose   ASSESSMENT:    81 y.o. male admitted with:  Strep infantarius and E coli bacteremia due to #2 Cholangitis s/p ERCP, gallstones Severe sepsis AKI Elevated LFTs, T bili Encephalopathy Hx of CAD s/p CABG Atrial fibrillation   RECOMMENDATIONS:    Will narrow to Unasyn dosed for kidney function Given Strep infantarius in blood cultures, recommend TEE Encephalopathy likely multifactorial from AKI, ALI, and medications.  Lower suspicion at this time for CNS infection.  Continue to monitor mental status for improvement Will need evaluation for colonic neoplasia (due to Strep infantarius) as well when more stabilized although bacteremia is due to cholangitis Follow up repeat blood cultures Will check hepatitis panel for completeness Lab monitoring Will follow   Principal Problem:   Bacteremia due to Streptococcus Active Problems:   Coronary artery disease   Paroxysmal A-fib (HCC)   Sepsis (HCC)   Calculus of bile duct with cholangitis and obstruction   Jaundice   Elevated LFTs   Hyperbilirubinemia   Bacteremia due to Escherichia coli    MEDICATIONS:    Scheduled Meds:  chlorhexidine  15 mL Mouth Rinse BID   Chlorhexidine Gluconate Cloth  6 each Topical Q0600   insulin aspart  0-15 Units Subcutaneous Q4H   mouth rinse  15 mL Mouth Rinse q12n4p   metoprolol tartrate  5 mg Intravenous Q6H   tamsulosin  0.4 mg Oral Daily   Continuous Infusions:  cefTRIAXone (ROCEPHIN)  IV Stopped (10/24/21 1518)   diltiazem (CARDIZEM) infusion 5 mg/hr (10/25/21 0700)   heparin 1,400 Units/hr (10/25/21 0700)   metronidazole Stopped (10/25/21 0439)   PRN Meds:.docusate sodium, haloperidol lactate, ondansetron (ZOFRAN) IV,  polyethylene glycol  SUBJECTIVE:   24 hour events:  Overnight events noted with issues related to A-fib, oliguria, and decreased level of consciousness. ABG done -- 7.435/30 point 3/72/20.4 Afebrile, Tmax 98.9 Saturating 98 to 100% on 2 L via nasal cannula Not requiring pressors Repeat blood cultures drawn yesterday no growth to date Strep species identified as Streptococcus infantarius WBC improved down to 9.3 this morning LFTs remain significantly elevated but overall stable/improved T. bili stable at 3.5 Worsening AKI with creatinine 4.42 Urine output over the last 24 hours measured as 0.2 mL/kg/h   Patient is encephalopathic and lethargic but is able to follow simple commands.  Review of Systems  Unable to perform ROS: Mental status change     OBJECTIVE:   Blood pressure (!) 141/71, pulse (!) 115, temperature 98.7 F (37.1 C), temperature source Axillary, resp. rate (!) 27, height 5\' 11"  (1.803 m), weight 118.2 kg, SpO2 100 %. Body mass index is 36.34 kg/m.  Physical Exam Constitutional:      General: He is not in acute distress.    Appearance: He is ill-appearing.  HENT:     Head: Normocephalic and atraumatic.  Eyes:     Extraocular Movements: Extraocular movements intact.     Conjunctiva/sclera: Conjunctivae normal.  Cardiovascular:     Rate and Rhythm: Normal rate. Rhythm irregular.  Pulmonary:     Effort: Pulmonary effort is normal. No respiratory distress.     Comments: Diminished breath sounds at bases.  Abdominal:     General: There is no  distension.     Palpations: Abdomen is soft.     Tenderness: There is no abdominal tenderness. There is no guarding or rebound.  Genitourinary:    Comments: Foley in place.  Musculoskeletal:        General: Normal range of motion.  Skin:    General: Skin is warm and dry.     Coloration: Skin is not jaundiced.  Neurological:     General: No focal deficit present.     Mental Status: He is alert. He is disoriented.      Lab Results: Lab Results  Component Value Date   WBC 9.3 10/25/2021   HGB 11.1 (L) 10/25/2021   HCT 33.7 (L) 10/25/2021   MCV 89.2 10/25/2021   PLT 63 (L) 10/25/2021    Lab Results  Component Value Date   NA 140 10/25/2021   K 4.4 10/25/2021   CO2 21 (L) 10/25/2021   GLUCOSE 96 10/25/2021   BUN 57 (H) 10/25/2021   CREATININE 4.42 (H) 10/25/2021   CALCIUM 8.4 (L) 10/25/2021   GFRNONAA 13 (L) 10/25/2021   GFRAA 55 (L) 09/29/2020    Lab Results  Component Value Date   ALT 905 (H) 10/25/2021   AST 1,104 (H) 10/25/2021   ALKPHOS 150 (H) 10/25/2021   BILITOT 3.5 (H) 10/25/2021    No results found for: CRP  No results found for: ESRSEDRATE   I have reviewed the micro and lab results in Epic.  Imaging: DG ERCP WITH SPHINCTEROTOMY  Result Date: 10/23/2021 CLINICAL DATA:  Chest and back pain Cholelithiasis Dilated CBD on CT EXAM: ERCP TECHNIQUE: Multiple spot images obtained with the fluoroscopic device and submitted for interpretation post-procedure. FLUOROSCOPY: Radiation Exposure Index (as provided by the fluoroscopic device): 65.5 mGy Kerma COMPARISON:  None. FINDINGS: Thirteen images were submitted for interpretation. The submitted images demonstrate cannulation of the common bile duct. A filling defect is seen in the mid duct on the initial images which is no longer identified following balloon sweep. IMPRESSION: Intraoperative fluoroscopic images of ERCP as above. These images were submitted for radiologic interpretation only. Please see the procedural report for the amount of contrast and the fluoroscopy time utilized. Electronically Signed   By: Miachel Roux M.D.   On: 10/23/2021 11:45   ECHOCARDIOGRAM COMPLETE  Result Date: 10/23/2021    ECHOCARDIOGRAM REPORT   Patient Name:   [name] Young. Date of Exam: 10/23/2021 Medical Rec #:  [id number]            Height:       71.0 in Accession #:    [id number]           Weight:       230.0 lb Date of Birth:  [birth year]            BSA:          2.238 m Patient Age:    51 years             BP:           96/53 mmHg Patient Gender: M                    HR:           92 bpm. Exam Location:  Inpatient Procedure: 2D Echo Indications:    dyspnea  History:        Patient has prior history of Echocardiogram examinations, most  recent 01/18/2018. Prior CABG, Sepsis. Chronic kidney disease;                 Risk Factors:Diabetes, Dyslipidemia and Hypertension.  Sonographer:    Johny Chess RDCS Referring Phys: [place]  1. Left ventricular ejection fraction, by estimation, is 40 to 45%. The left ventricle has mildly decreased function. The left ventricle demonstrates global hypokinesis. Left ventricular diastolic parameters are consistent with Grade II diastolic dysfunction (pseudonormalization). Elevated left ventricular end-diastolic pressure.  2. Right ventricular systolic function is normal. The right ventricular size is normal. There is normal pulmonary artery systolic pressure. The estimated right ventricular systolic pressure is 52.8 mmHg.  3. Left atrial size was mildly dilated.  4. The mitral valve is degenerative. Trivial mitral valve regurgitation. No evidence of mitral stenosis.  5. The aortic valve is calcified. There is moderate calcification of the aortic valve. There is moderate thickening of the aortic valve. Aortic valve regurgitation is not visualized. Aortic valve sclerosis/calcification is present, without any evidence of aortic stenosis.  6. There is mild dilatation of the aortic root, measuring 41 mm. There is mild dilatation of the ascending aorta, measuring 38 mm.  7. The inferior vena cava is dilated in size with >50% respiratory variability, suggesting right atrial pressure of 8 mmHg. FINDINGS  Left Ventricle: Left ventricular ejection fraction, by estimation, is 40 to 45%. The left ventricle has mildly decreased function. The left ventricle demonstrates global hypokinesis.  The left ventricular internal cavity size was normal in size. There is  no left ventricular hypertrophy. Left ventricular diastolic parameters are consistent with Grade II diastolic dysfunction (pseudonormalization). Elevated left ventricular end-diastolic pressure. Right Ventricle: The right ventricular size is normal. No increase in right ventricular wall thickness. Right ventricular systolic function is normal. There is normal pulmonary artery systolic pressure. The tricuspid regurgitant velocity is 2.02 m/s, and  with an assumed right atrial pressure of 8 mmHg, the estimated right ventricular systolic pressure is 41.3 mmHg. Left Atrium: Left atrial size was mildly dilated. Right Atrium: Right atrial size was normal in size. Pericardium: There is no evidence of pericardial effusion. Mitral Valve: The mitral valve is degenerative in appearance. There is moderate thickening of the mitral valve leaflet(s). There is moderate calcification of the mitral valve leaflet(s). Mild mitral annular calcification. Trivial mitral valve regurgitation. No evidence of mitral valve stenosis. Tricuspid Valve: The tricuspid valve is normal in structure. Tricuspid valve regurgitation is trivial. No evidence of tricuspid stenosis. Aortic Valve: The aortic valve is calcified. There is moderate calcification of the aortic valve. There is moderate thickening of the aortic valve. Aortic valve regurgitation is not visualized. Aortic valve sclerosis/calcification is present, without any  evidence of aortic stenosis. Pulmonic Valve: The pulmonic valve was normal in structure. Pulmonic valve regurgitation is trivial. No evidence of pulmonic stenosis. Aorta: The aortic root is normal in size and structure. There is mild dilatation of the aortic root, measuring 41 mm. There is mild dilatation of the ascending aorta, measuring 38 mm. Venous: The inferior vena cava is dilated in size with greater than 50% respiratory variability, suggesting right  atrial pressure of 8 mmHg. IAS/Shunts: No atrial level shunt detected by color flow Doppler.  LEFT VENTRICLE PLAX 2D LVIDd:         6.30 cm      Diastology LVIDs:         4.60 cm      LV e' medial:    5.87 cm/s LV PW:  1.10 cm      LV E/e' medial:  16.7 LV IVS:        1.00 cm      LV e' lateral:   7.29 cm/s LVOT diam:     2.60 cm      LV E/e' lateral: 13.5 LV SV:         73 LV SV Index:   33 LVOT Area:     5.31 cm  LV Volumes (MOD) LV vol d, MOD A2C: 115.0 ml LV vol s, MOD A2C: 75.8 ml LV SV MOD A2C:     39.2 ml RIGHT VENTRICLE            IVC RV S prime:     9.03 cm/s  IVC diam: 2.60 cm TAPSE (M-mode): 1.3 cm LEFT ATRIUM             Index        RIGHT ATRIUM           Index LA diam:        4.30 cm 1.92 cm/m   RA Area:     17.00 cm LA Vol (A2C):   87.2 ml 38.97 ml/m  RA Volume:   43.90 ml  19.62 ml/m LA Vol (A4C):   66.9 ml 29.90 ml/m LA Biplane Vol: 77.2 ml 34.50 ml/m  AORTIC VALVE LVOT Vmax:   78.30 cm/s LVOT Vmean:  52.600 cm/s LVOT VTI:    0.138 m  AORTA Ao Root diam: 4.10 cm Ao Asc diam:  3.80 cm MITRAL VALVE               TRICUSPID VALVE MV Area (PHT): 4.49 cm    TR Peak grad:   16.3 mmHg MV Decel Time: 169 msec    TR Vmax:        202.00 cm/s MV E velocity: 98.20 cm/s MV A velocity: 83.10 cm/s  SHUNTS MV E/A ratio:  1.18        Systemic VTI:  0.14 m                            Systemic Diam: 2.60 cm Fransico Him MD Electronically signed by Fransico Him MD Signature Date/Time: 10/23/2021/4:51:21 PM    Final      Imaging independently reviewed in Epic.    Rayn[facility]ious [facility] [phone] pager 10/25/2021, 8:19 AM

## 2021-10-25 NOTE — Progress Notes (Addendum)
[facility] Progress Note Patient Name: [name] Young. DOB: 1941-09-07 MRN: [id number]   Date of Service  10/25/2021  HPI/Events of Note  Patient in AFIB with ventricular rate = 105 to 130. BP = 153/86. K+ = 4.4 and Mg++ = 2.5.  eICU Interventions  Plan: Cardizem IV infusion. Titrate to HR = 65-105. D/C Heparin [place]. Heparin IV infusion per pharmacy consultation.     Intervention Category Major Interventions: Arrhythmia - evaluation and management  Zaneta Lightcap [place] 10/25/2021, 5:58 AM

## 2021-10-25 NOTE — Progress Notes (Signed)
OT Cancellation Note  Patient Details Name: Marguis Mathieson. MRN: [id number] DOB: 02-21-1941   Cancelled Treatment:    Reason Eval/Treat Not Completed: Fatigue/lethargy limiting ability to participate Per RN, pt able to follow commands but unable to stay awake long enough to successfully participate with therapy this AM. Will follow-up as schedule permits for OT eval.  Layla Maw 10/25/2021, 8:00 AM

## 2021-10-25 NOTE — Assessment & Plan Note (Signed)
Presented with abdominal discomfort.  Sepsis secondary to cholangitis.  Blood cultures showing E. coli, strep to coccus.  ID following.  Continue ceftriaxone.

## 2021-10-25 NOTE — Assessment & Plan Note (Signed)
Secondary to cholangitis.  Blood cultures showing E. coli and staph coccus.  Currently on ceftriaxone.  ID following.  Follow-up final culture report.

## 2021-10-25 NOTE — Progress Notes (Signed)
This nurse secure chatted Sarah RN and Dr. Carlis Abbott regarding VAST consult. At this time patient has 2 currently working IV's with compatible medications at this time. D/t multiple PIV restarts since 2 day admission, recommend that CVC line be considered if continuing current medication regimen. Attempted to reach nurse also by phone.  Fran Lowes, RN VAST

## 2021-10-25 NOTE — Progress Notes (Signed)
PT Cancellation Note  Patient Details Name: [name] Young. MRN: [id number] DOB: 1941/02/08   Cancelled Treatment:    Reason Eval/Treat Not Completed: Fatigue/lethargy limiting ability to participate Pt has been lethargic all day. RN request follow back tomorrow for Evaluation.   Kynleigh Artz B. Migdalia Dk PT, DPT Acute Rehabilitation Services Pager [phone] Office [phone]    [place] 10/25/2021, 12:50 PM

## 2021-10-25 NOTE — Progress Notes (Addendum)
NAME:  [name], Young MRN:  [id number], DOB:  11-14-1940, LOS: 2 ADMISSION DATE:  10/23/2021, CONSULTATION DATE:  10/23/2021 REFERRING MD: Orlena Sheldon, MD CHIEF COMPLAINT:  abdominal pain   History of Present Illness:  [name] Young. Is a 81 y.o. male with a PMH significant for CAD s/p CAGB 2019 A-fib,, HTN. HLD, diabetes, GERD, and CKD who presented to the ED for complaints of abdominal pain with associated nausea and vomiting  that began the day prior to admission at approximately 1600.    On ED arrival patient was seen in A-fib RVR, hypertensive, and tachypnea. Labwork significant for creatinine 1.91, elevated LFTs, lipase 390, total bill 5.9, lactic acid 8.9. CT head negative but CTA chest/abd/pelvis reveled dilated bile duct, acute cholelithiasis without cholecystis. GI was consulted and recommended urgent MRCP to better characterize biliary system. Given sepsis criteria on admission PCCM was consulted for further management and admission.   Pertinent  Medical History  CAD s/p CAGB 2019 A-fib,, HTN. HLD, diabetes, GERD, and CKD  Significant Hospital Events: Including procedures, antibiotic start and stop dates in addition to other pertinent events   2/12 Admitted with sepsis felt secondary to GI source  2/13 Initially given Zosyn then switched to Ceftriaxone and Metronidazole for bacteremia. Oliguric AKI. Confusion and trying to get out of bed , given one dose of ativan and continued on PRN Haldol   Interim History / Subjective:  [facility] contacted for Oliguria and lethargy. Given one dose of IV lasix 80 mg and ABG obtained. ABG = 7.435/30.3/72/20.4. Noted to be in atrial fibrillation this morning and started on Cardizem drip. Started on treatment dose IV Heparin for Afib.   Patient wakes up to voice this morning , but falls back to sleep quickly.  Objective   Blood pressure (!) 153/86, pulse 87, temperature 98.6 F (37 C), temperature source Axillary, resp. rate (!) 30, height 5'  11" (1.803 m), weight 118.2 kg, SpO2 100 %.        Intake/Output Summary (Last 24 hours) at 10/25/2021 0553 Last data filed at 10/25/2021 0400 Gross per 24 hour  Intake 1867.23 ml  Output 525 ml  Net 1342.23 ml    Filed Weights   10/23/21 0340 10/25/21 0414  Weight: 104.3 kg 118.2 kg    Examination:  General: ill appearing, somnolent HEENT: antiicteric sclerae, pupil equal Cardiovascular: irregular pulse, tachycardic Pulmonary : Tachypnea, no accessory muscle use, on 2L supplemental oxygen via [place] Abdominal: soft, tenderness on deep palpation,  bowel sounds present Musculoskeletal: no swelling or tenderness in extremities, Skin: Warm, dry , no bruising, erythema, or rash Neuro: Awakens to voice, follow simple commands , he knows he is at [place] 2/14  Cr 3.19 > 4.42 , with 525 cc out ( IV Lasix 25m once) BUN 37 >57 CO2 23> 21 , anion gap 17  AST 1557>1104 ALT 849 >905 Alk Phos 106 > 150 T Billi 3.7 > 3.5  WBC 11.4 > 9.3 Hgb 9.4 > 11.1 ( Baseline 10 -12) Platelets 63   Lactic Acid 5.1 > 3  Micro:  Blood Cultures 2/12 : Streptococcus Infantarius , Escherichia Coli Blood Cultures 2/13: Pending  2/12 TEE: EF 40 to 45%, global-hypokinesis of left ventricle, grade 2 diastolic dysfunction. No vegetation.  Resolved Hospital Problem list     Assessment & Plan:  Sepsis secondary to E.coli and Streptococcus species bacteremia from ascending cholangitis now s/p ERCP with two stones removed and sphincterotomy - Abdominal pain  elicited on deep palpation or right quadrant this morning. Elevated liver enzymes. AST trending down. WBC trending down. Tmax 98.9. LA improving. Expect to continue to improve.   - Appreciate consult by GI , Cardiology , and ID - Continue ceftriaxone and metronidazole, follow susceptibilities on culture 2/12 - Follow repeat blood cultures 2/13 - Eventually patient will need surgical consult to discuss cholecystectomy  - NPO until  mental status improves - Trend liver enzymes - TEE per cardiology   Acute metabolic encephalopathy - In setting of Sepsis with liver and kidney injuries. Given one dose of ativan 2/13, will avoid benzodiazapine for agitation.   - Try to redirect patient if agitated. PRN low dose haldol and soft restraints for non redirectable agitation.  - PT, OT as patient is able to tolerate  AKI with oliguria, on CKD stage 3A Baseline creatinine 1.4-1.6. - Review of CT Angio Chest/Abd/pel from yesterday showed 5 mm calculus at right ureteropelvic junction, no hydronephrosis, nl bladder.  - Patient was not retaining on bladder scan yesterday.  Foley catheter placed for close monitoring of urine output in setting of oliguric AKI. Continue to monitor output. - Urine output is increasing this morning, bedside 225cc out since 6 am. No emergent dialysis needs at this time.  - Family requested nephrology consult, nephrology consulted this morning  BPH - Problem list in chart shows hx of BPH, previously treated with Doxazosin. PSA was stable and stopped monitoring given age. Patient was off of treatment and reported minimal symptoms to PCP. Having frequent urination at home on interview 2/13. - continue Tamsulosin  Atrial Fibrillation with RVR - hx of PAF - Started on Heparin and Cardizem IV infusion overnight  - Stop Cardizem. Not taking PO. Start lopressor 18m q6h, titrate up as needed for rate control, goal <130 BPM in setting of sepsis, < 110 as sepsis resolves.  - Monitor on Tele  T2DM Hemoglobin A1c 6.1 on 10/23/21 -Sitagliptin and metformin at home, hold home medications in setting of AKI - At hospital goal  - Continue SSI-M  Hx of CAD s/p CABG Hx of HTN/HLD HFmrEF - Home medications of Toprol-xl 25 mg, lisinopril 40 mg, ASA 81 mg, Crestor - global hypokinesis on Echo 2/12, in setting of sepsis. ST depressions on lateral leads, repeat EKG 2/13 nl. HS-Troponin 68 > 142 - Appreciate cardiology  consulting. May need ischemic evaluation once stable.   Chronic Anemia - Stable  Best Practice (right click and "Reselect all SmartList Selections" daily)   Diet/type: NPO DVT prophylaxis: systemic heparin GI prophylaxis: N/A Lines: N/A Foley:  Yes, and it is still needed Code Status:  full code Last date of multidisciplinary goals of cxare discussion _0

## 2021-10-25 NOTE — Assessment & Plan Note (Signed)
History of three-vessel disease with CABG in 2019.  Cardiology following.  Denies any chest pain.  EKG showed ST depression in lateral leads

## 2021-10-25 NOTE — Assessment & Plan Note (Signed)
Continue metoprolol.  Monitor on telemetry.  Continue anticoagulation

## 2021-10-25 NOTE — Assessment & Plan Note (Signed)
On ceftriaxone

## 2021-10-25 NOTE — Consult Note (Signed)
[facility] Nephrology Consultation Note  Requesting MD: Noemi Chapel, DO Reason for consult: AKI  HPI:  [name] Young. is a 81 y.o. male with a past medical history of CAD with s/p CABG, paroxsymal afib, HTN, HLD, diabetes, CKD stage 3a who presented to nausea an vomiting and admitted for cholangitis s/p ERCP.  Patient was feeling well until he started having abdominal pain with vomiting on 2/12. Admitted for cholangitis. Daughter reports worsening renal function since admission with little to no urine output. Reports history of CKD 3a  and history of frequent kidney stones. Reports yesterday afternoon he was more contused puttling at lines and given ativan and has been somnolent since then. Exam limited due to lethargy. Nephrology consulted at daughter's request for worsening renal function.   Creat  Date/Time Value Ref Range Status  02/01/2010 12:00 AM 0.95  Final   Creatinine, Ser  Date/Time Value Ref Range Status  10/25/2021 12:29 AM 4.42 (H) 0.61 - 1.24 mg/dL Final  10/24/2021 04:11 AM 3.19 (H) 0.61 - 1.24 mg/dL Final  10/23/2021 09:54 PM 2.67 (H) 0.61 - 1.24 mg/dL Final  10/23/2021 09:33 AM 2.11 (H) 0.61 - 1.24 mg/dL Final  10/23/2021 03:34 AM 1.91 (H) 0.61 - 1.24 mg/dL Final  06/07/2021 12:28 PM 1.34 0.40 - 1.50 mg/dL Final  10/13/2020 08:49 AM 1.42 (H) 0.61 - 1.24 mg/dL Final  09/29/2020 10:41 AM 1.40 (H) 0.76 - 1.27 mg/dL Final  05/20/2020 08:39 AM 1.52 (H) 0.40 - 1.50 mg/dL Final  04/23/2020 07:20 AM 1.45 (H) 0.61 - 1.24 mg/dL Final  04/09/2020 08:15 AM 1.61 (H) 0.61 - 1.24 mg/dL Final  03/26/2020 08:51 AM 1.46 (H) 0.76 - 1.27 mg/dL Final  10/01/2019 07:54 AM 1.37 (H) 0.61 - 1.24 mg/dL Final  05/15/2019 08:34 AM 1.17 0.40 - 1.50 mg/dL Final  02/13/2019 07:05 AM 1.38 (H) 0.61 - 1.24 mg/dL Final  01/30/2019 09:34 AM 1.31 (H) 0.61 - 1.24 mg/dL Final  05/14/2018 09:35 AM 1.59 (H) 0.40 - 1.50 mg/dL Final  04/11/2018 08:58 AM 1.49 (H) 0.76 - 1.27 mg/dL  Final  02/08/2018 12:24 PM 1.38 0.40 - 1.50 mg/dL Final  02/01/2018 05:57 AM 1.66 (H) 0.61 - 1.24 mg/dL Final  01/29/2018 06:27 AM 1.64 (H) 0.61 - 1.24 mg/dL Final  01/28/2018 07:17 PM 1.69 (H) 0.61 - 1.24 mg/dL Final  01/28/2018 04:34 AM 1.62 (H) 0.61 - 1.24 mg/dL Final  01/27/2018 02:55 AM 1.42 (H) 0.61 - 1.24 mg/dL Final  01/26/2018 04:19 AM 1.54 (H) 0.61 - 1.24 mg/dL Final  01/25/2018 03:58 AM 1.76 (H) 0.61 - 1.24 mg/dL Final  01/24/2018 04:44 AM 2.06 (H) 0.61 - 1.24 mg/dL Final  01/23/2018 05:26 AM 2.96 (H) 0.61 - 1.24 mg/dL Final  01/22/2018 04:29 AM 3.30 (H) 0.61 - 1.24 mg/dL Final  01/21/2018 03:05 AM 2.91 (H) 0.61 - 1.24 mg/dL Final  01/20/2018 04:34 PM 2.30 (H) 0.61 - 1.24 mg/dL Final  01/20/2018 05:34 AM 2.10 (H) 0.61 - 1.24 mg/dL Final  01/19/2018 04:23 PM 1.70 (H) 0.61 - 1.24 mg/dL Final  01/19/2018 04:21 PM 1.79 (H) 0.61 - 1.24 mg/dL Final  01/19/2018 04:00 AM 1.59 (H) 0.61 - 1.24 mg/dL Final  01/18/2018 07:53 PM 1.40 (H) 0.61 - 1.24 mg/dL Final  01/18/2018 07:52 PM 1.63 (H) 0.61 - 1.24 mg/dL Final  01/18/2018 12:52 PM 1.30 (H) 0.61 - 1.24 mg/dL Final  01/18/2018 12:08 PM 1.30 (H) 0.61 - 1.24 mg/dL Final  01/18/2018 11:11 AM 1.30 (H) 0.61 - 1.24 mg/dL Final  01/18/2018  10:11 AM 1.20 0.61 - 1.24 mg/dL Final  01/18/2018 09:52 AM 1.40 (H) 0.61 - 1.24 mg/dL Final  01/18/2018 07:48 AM 1.50 (H) 0.61 - 1.24 mg/dL Final  01/16/2018 01:37 PM 1.66 (H) 0.61 - 1.24 mg/dL Final  01/11/2018 12:00 AM 1.63 (H) 0.76 - 1.27 mg/dL Final  12/24/2017 08:09 AM 1.31 (H) 0.76 - 1.27 mg/dL Final  12/19/2017 12:18 PM 1.34 (H) 0.76 - 1.27 mg/dL Final  12/10/2017 08:02 AM 1.42 0.40 - 1.50 mg/dL Final  11/05/2017 03:18 PM 1.42 0.40 - 1.50 mg/dL Final  06/11/2017 07:52 AM 1.38 0.40 - 1.50 mg/dL Final  11/06/2016 08:25 AM 1.28 0.40 - 1.50 mg/dL Final  05/01/2016 08:08 AM 1.39 0.40 - 1.50 mg/dL Final     PMHx:   Past Medical History:  Diagnosis Date   Arthritis    Cataract    lens implant  bilateral   Chronic kidney disease    kidney stones   Coronary artery disease    Dyspnea    Dysrhythmia    GERD (gastroesophageal reflux disease)    History of cholelithiasis    History of kidney stones    ca ox [name]) now [facility]   History of pneumonia    HLD (hyperlipidemia)    HTN (hypertension)    Jaundice    age 32   Pneumonia    years ago    T2DM (type 2 diabetes mellitus) ([place]) 2010    Past Surgical History:  Procedure Laterality Date   CARPAL TUNNEL RELEASE Bilateral    CATARACT EXTRACTION, BILATERAL     COLONOSCOPY  11/2012   11 adenomatous polyps, diverticulosis, rec rpt 1 yr Ardis Hughs)   COLONOSCOPY  12/2013   3 polyps, diverticulosis, rec rpt 3 yrs Ardis Hughs)   COLONOSCOPY  06/2019   6 polyps (TA), diverticulosis, f/u left open ended Ardis Hughs)   CORONARY ARTERY BYPASS GRAFT N/A 01/18/2018   Procedure: CORONARY ARTERY BYPASS GRAFTING (CABG) x 3; Using Left Internal Mammary Artery, and Right Greater Saphenous Vein harvested Endoscopically, Coronary Artery Endarterectomy;  Surgeon: Ivin Poot, MD;  Location: [facility];  Service: Open Heart Surgery;  Laterality: N/A;   ERCP N/A 10/23/2021   Procedure: ENDOSCOPIC RETROGRADE CHOLANGIOPANCREATOGRAPHY (ERCP);  Surgeon: Ladene Artist, MD;  Location: [facility] ENDOSCOPY;  Service: Endoscopy;  Laterality: N/A;   KNEE CARTILAGE SURGERY Left    LEFT HEART CATH AND CORONARY ANGIOGRAPHY N/A 12/21/2017   Procedure: LEFT HEART CATH AND CORONARY ANGIOGRAPHY;  Surgeon: Nelva Bush, MD;  Location: [facility] CV LAB;  Service: Cardiovascular;  Laterality: N/A;   LITHOTRIPSY     REMOVAL OF STONES  10/23/2021   Procedure: REMOVAL OF STONES;  Surgeon: Ladene Artist, MD;  Location: [facility] ENDOSCOPY;  Service: Endoscopy;;   SPHINCTEROTOMY  10/23/2021   Procedure: Joan Mayans;  Surgeon: Ladene Artist, MD;  Location: [facility];  Service: Endoscopy;;   TEE WITHOUT CARDIOVERSION N/A 01/18/2018   Procedure: TRANSESOPHAGEAL  ECHOCARDIOGRAM (TEE);  Surgeon: Prescott Gum, Collier Salina, MD;  Location: [facility];  Service: Open Heart Surgery;  Laterality: N/A;   UMBILICAL HERNIA REPAIR     with mesh    Family Hx:  Family History  Problem Relation Age of Onset   Alzheimer's disease Mother    Breast cancer Mother        breast   Atrial fibrillation Mother    CAD Mother    Stroke Father 11   Diabetes Father    CAD Father    Colon polyps Sister  Colon polyps Brother    Rectal cancer Maternal Grandfather        rectal   Colon cancer Maternal Grandfather 88   Esophageal cancer Neg Hx    Stomach cancer Neg Hx     Social History:  reports that he has never smoked. He quit smokeless tobacco use about 20 years ago.  His smokeless tobacco use included chew. He reports current alcohol use. He reports that he does not use drugs.  Allergies: No Known Allergies  Medications: Prior to Admission medications   Medication Sig Start Date End Date Taking? Authorizing Provider  aspirin EC 81 MG tablet Take 81 mg by mouth daily.   Yes [provider]  Cholecalciferol (VITAMIN D3) 25 MCG (1000 UT) CAPS Take 1 capsule (1,000 Units total) by mouth daily. 05/24/20  Yes Ria Bush, MD  Cyanocobalamin (B-12) 1000 MCG SUBL Place 1 tablet under the tongue daily. 05/24/20  Yes Ria Bush, MD  fenofibrate (TRICOR) 145 MG tablet Take 1 tablet (145 mg total) by mouth daily. 06/07/21  Yes Ria Bush, MD  lisinopril (ZESTRIL) 40 MG tablet TAKE 1 TABLET BY MOUTH ONCE A DAY Patient taking differently: Take 40 mg by mouth daily. 07/18/21  Yes End, Harrell Gave, MD  metFORMIN (GLUCOPHAGE) 500 MG tablet Take 1 tablet (500 mg total) by mouth daily with breakfast. 06/07/21  Yes Ria Bush, MD  metoprolol succinate (TOPROL-XL) 25 MG 24 hr tablet TAKE 1 TABLET BY MOUTH ONCE A DAY WITH OR IMMEDIATELY FOLLOWING A MEAL Patient taking differently: Take 25 mg by mouth daily. TAKE 1 TABLET BY MOUTH ONCE A DAY WITH OR IMMEDIATELY  FOLLOWING A MEAL 09/21/20  Yes End, Harrell Gave, MD  Multiple Vitamins-Minerals (MACULAR VITAMIN BENEFIT PO) Take 1 tablet by mouth daily.   Yes [provider]  Omega-3 Fatty Acids (FISH OIL) 1200 MG CAPS Take 2 capsules (2,400 mg total) by mouth daily. 05/24/20  Yes Ria Bush, MD  omeprazole (PRILOSEC) 40 MG capsule Take 1 capsule (40 mg total) by mouth every Monday, Wednesday, and Friday. 06/08/21  Yes Ria Bush, MD  rosuvastatin (CRESTOR) 10 MG tablet TAKE 1 TABLET BY MOUTH ONCE A DAY Patient taking differently: Take 10 mg by mouth daily. 07/13/21  Yes End, Harrell Gave, MD  sitaGLIPtin (JANUVIA) 50 MG tablet Take 1 tablet (50 mg total) by mouth daily. 06/07/21  Yes Ria Bush, MD  ACCU-CHEK AVIVA PLUS test strip USE AS DIRECTED TO CHECK BLOOD SUGARS UPTO FOUR TIMES DAILY. 03/16/20   Ria Bush, MD  blood glucose meter kit and supplies Dispense based on patient and insurance preference. Use up to four times daily as directed. (FOR ICD-10 E10.9, E11.9). 02/01/18   Angiulli, Lavon Paganini, PA-C  nitroGLYCERIN (NITROSTAT) 0.4 MG SL tablet Place 1 tablet (0.4 mg total) under the tongue every 5 (five) minutes as needed for chest pain. 03/26/20   End, Harrell Gave, MD    I have reviewed the patient's current medications.  Labs:  Results for orders placed or performed during the hospital encounter of 10/23/21 (from the past 48 hour(s))  Lactic acid, plasma     Status: Abnormal   Collection Time: 10/23/21  9:33 AM  Result Value Ref Range   Lactic Acid, Venous 7.8 (HH) 0.5 - 1.9 mmol/L    Comment: CRITICAL VALUE NOTED.  VALUE IS CONSISTENT WITH PREVIOUSLY REPORTED AND CALLED VALUE. Performed at [facility], [place] [address], [place], [place] [address]   CBC     Status: Abnormal   Collection  Time: 10/23/21  9:33 AM  Result Value Ref Range   WBC 14.8 (H) 4.0 - 10.5 K/uL   RBC 4.06 (L) 4.22 - 5.81 MIL/uL   Hemoglobin 12.0 (L) 13.0 - 17.0 g/dL   HCT 38.3 (L) 39.0 - 52.0  %   MCV 94.3 80.0 - 100.0 fL   MCH 29.6 26.0 - 34.0 pg   MCHC 31.3 30.0 - 36.0 g/dL   RDW 15.2 11.5 - 15.5 %   Platelets 149 (L) 150 - 400 K/uL   nRBC 0.0 0.0 - 0.2 %    Comment: Performed at [facility] [address] [place], [place] [address]  Creatinine, serum     Status: Abnormal   Collection Time: 10/23/21  9:33 AM  Result Value Ref Range   Creatinine, Ser 2.11 (H) 0.61 - 1.24 mg/dL   GFR, Estimated 31 (L) >60 mL/min    Comment: (NOTE) Calculated using the CKD-EPI Creatinine Equation (2021) Performed at [facility] [address], [place], [place] [address]   Hemoglobin A1c     Status: Abnormal   Collection Time: 10/23/21  9:33 AM  Result Value Ref Range   Hgb A1c MFr Bld 6.1 (H) 4.8 - 5.6 %    Comment: (NOTE) Pre diabetes:          5.7%-6.4%  Diabetes:              >6.4%  Glycemic control for   <7.0% adults with diabetes    Mean Plasma Glucose 128.37 mg/dL    Comment: Performed at [facility] [address], [place], [place] [address]  Glucose, capillary     Status: Abnormal   Collection Time: 10/23/21 10:27 AM  Result Value Ref Range   Glucose-Capillary 135 (H) 70 - 99 mg/dL    Comment: Glucose reference range applies only to samples taken after fasting for at least 8 hours.  CBG monitoring, ED     Status: Abnormal   Collection Time: 10/23/21  1:56 PM  Result Value Ref Range   Glucose-Capillary 119 (H) 70 - 99 mg/dL    Comment: Glucose reference range applies only to samples taken after fasting for at least 8 hours.  Glucose, capillary     Status: Abnormal   Collection Time: 10/23/21  2:26 PM  Result Value Ref Range   Glucose-Capillary 112 (H) 70 - 99 mg/dL    Comment: Glucose reference range applies only to samples taken after fasting for at least 8 hours.  MRSA Next Gen by PCR, Nasal     Status: None   Collection Time: 10/23/21  2:27 PM   Specimen: Nasal Mucosa; Nasal Swab  Result Value Ref Range   MRSA by PCR Next Gen NOT DETECTED  NOT DETECTED    Comment: (NOTE) The GeneXpert MRSA Assay (FDA approved for NASAL specimens only), is one component of a comprehensive MRSA colonization surveillance program. It is not intended to diagnose MRSA infection nor to guide or monitor treatment for MRSA infections. Test performance is not FDA approved in patients less than 85 years old. Performed at [facility], [place] [address] [place], [place] [address]   Lactic acid, plasma     Status: Abnormal   Collection Time: 10/23/21  2:46 PM  Result Value Ref Range   Lactic Acid, Venous 6.5 (HH) 0.5 - 1.9 mmol/L    Comment: CRITICAL VALUE NOTED.  VALUE IS CONSISTENT WITH PREVIOUSLY REPORTED AND CALLED VALUE. Performed at [facility], [place] [address], [place], [place] [address]   Glucose, capillary     Status: None   Collection Time: 10/23/21  4:11 PM  Result Value Ref Range   Glucose-Capillary 90 70 - 99 mg/dL    Comment: Glucose reference range applies only to samples taken after fasting for at least 8 hours.  Blood gas, venous (at [facility], not at [facility])     Status: Abnormal   Collection Time: 10/23/21  6:30 PM  Result Value Ref Range   pH, Ven 7.301 7.250 - 7.430   pCO2, Ven 37.4 (L) 44.0 - 60.0 mmHg   pO2, Ven 31.1 (LL) 32.0 - 45.0 mmHg    Comment: CRITICAL RESULT CALLED TO, READ BACK BY AND VERIFIED WITH: J.BEABRAUT RN @ [phone] 10/23/2021 BY C.EDENS    Bicarbonate 17.9 (L) 20.0 - 28.0 mmol/L   Acid-base deficit 7.3 (H) 0.0 - 2.0 mmol/L   O2 Saturation 50.9 %   Patient temperature 36.8    Sample type VENOUS     Comment: Performed at [facility], [place] [address], [place], [place] [address]  Lactic acid, plasma     Status: Abnormal   Collection Time: 10/23/21  6:30 PM  Result Value Ref Range   Lactic Acid, Venous 6.5 (HH) 0.5 - 1.9 mmol/L    Comment: CRITICAL VALUE NOTED.  VALUE IS CONSISTENT WITH PREVIOUSLY REPORTED AND CALLED VALUE. Performed at [facility], [place] [address], [place], [place]  [address]   Glucose, capillary     Status: Abnormal   Collection Time: 10/23/21  7:27 PM  Result Value Ref Range   Glucose-Capillary 112 (H) 70 - 99 mg/dL    Comment: Glucose reference range applies only to samples taken after fasting for at least 8 hours.  Lactic acid, plasma     Status: Abnormal   Collection Time: 10/23/21  8:25 PM  Result Value Ref Range   Lactic Acid, Venous 7.3 (HH) 0.5 - 1.9 mmol/L    Comment: CRITICAL VALUE NOTED.  VALUE IS CONSISTENT WITH PREVIOUSLY REPORTED AND CALLED VALUE. Performed at [facility], [place] [address], [place], [place] [address]   Basic metabolic panel     Status: Abnormal   Collection Time: 10/23/21  9:54 PM  Result Value Ref Range   Sodium 136 135 - 145 mmol/L   Potassium 4.1 3.5 - 5.1 mmol/L   Chloride 105 98 - 111 mmol/L   CO2 14 (L) 22 - 32 mmol/L   Glucose, Bld 108 (H) 70 - 99 mg/dL    Comment: Glucose reference range applies only to samples taken after fasting for at least 8 hours.   BUN 34 (H) 8 - 23 mg/dL   Creatinine, Ser 2.67 (H) 0.61 - 1.24 mg/dL   Calcium 7.6 (L) 8.9 - 10.3 mg/dL   GFR, Estimated 24 (L) >60 mL/min    Comment: (NOTE) Calculated using the CKD-EPI Creatinine Equation (2021)    Anion gap 17 (H) 5 - 15    Comment: Performed at [facility] [address], [place], [place] [address]  Magnesium     Status: Abnormal   Collection Time: 10/23/21  9:54 PM  Result Value Ref Range   Magnesium 1.3 (L) 1.7 - 2.4 mg/dL    Comment: Performed at [facility] [address] [place], [place] [address]  CBC with Differential/Platelet     Status: Abnormal   Collection Time: 10/23/21  9:54 PM  Result Value Ref Range   WBC 16.2 (H) 4.0 - 10.5 K/uL   RBC 3.21 (L) 4.22 - 5.81 MIL/uL   Hemoglobin 9.5 (L) 13.0 - 17.0 g/dL   HCT 30.6 (L) 39.0 - 52.0 %   MCV 95.3 80.0 - 100.0 fL   MCH 29.6 26.0 - 34.0 pg   MCHC 31.0 30.0 - 36.0 g/dL   RDW 15.7 (H) 11.5 - 15.5 %   Platelets 96 (L) 150 - 400 K/uL    Comment:  Immature Platelet Fraction may be clinically indicated, consider ordering this additional test [id number] REPEATED TO VERIFY PLATELET COUNT CONFIRMED BY SMEAR    nRBC 0.0 0.0 - 0.2 %   Neutrophils Relative % 90 %   Neutro Abs 14.4 (H) 1.7 - 7.7 K/uL   Lymphocytes Relative 4 %   Lymphs Abs 0.7 0.7 - 4.0 K/uL   Monocytes Relative 4 %   Monocytes Absolute 0.7 0.1 - 1.0 K/uL   Eosinophils Relative 0 %   Eosinophils Absolute 0.0 0.0 - 0.5 K/uL   Basophils Relative 0 %   Basophils Absolute 0.0 0.0 - 0.1 K/uL   WBC Morphology MILD LEFT SHIFT (1-5% METAS, OCC MYELO, OCC BANDS)    RBC Morphology MORPHOLOGY UNREMARKABLE    Smear Review PLATELETS APPEAR DECREASED    Immature Granulocytes 2 %   Abs Immature Granulocytes 0.32 (H) 0.00 - 0.07 K/uL   Dohle Bodies PRESENT     Comment: Performed at [facility], [address] [place], [place] [address]  Glucose, capillary     Status: Abnormal   Collection Time: 10/23/21 11:21 PM  Result Value Ref Range   Glucose-Capillary 124 (H) 70 - 99 mg/dL    Comment: Glucose reference range applies only to samples taken after fasting for at least 8 hours.  Glucose, capillary     Status: Abnormal   Collection Time: 10/24/21  3:30 AM  Result Value Ref Range   Glucose-Capillary 135 (H) 70 - 99 mg/dL    Comment: Glucose reference range applies only to samples taken after fasting for at least 8 hours.  Magnesium     Status: Abnormal   Collection Time: 10/24/21  4:11 AM  Result Value Ref Range   Magnesium 1.6 (L) 1.7 - 2.4 mg/dL    Comment: Performed at [facility] [address] [place], [place] [address]  Phosphorus     Status: Abnormal   Collection Time: 10/24/21  4:11 AM  Result Value Ref Range   Phosphorus 5.8 (H) 2.5 - 4.6 mg/dL    Comment: Performed at [facility] [address] [place], [place] [address]  CBC     Status: Abnormal   Collection Time: 10/24/21  4:11 AM  Result Value Ref Range   WBC 11.4 (H) 4.0 - 10.5 K/uL    RBC 3.27 (L) 4.22 - 5.81 MIL/uL   Hemoglobin 9.4 (L) 13.0 - 17.0 g/dL   HCT 30.2 (L) 39.0 - 52.0 %   MCV 92.4 80.0 - 100.0 fL   MCH 28.7 26.0 - 34.0 pg   MCHC 31.1 30.0 - 36.0 g/dL   RDW 15.6 (H) 11.5 - 15.5 %   Platelets 75 (L) 150 - 400 K/uL    Comment: Immature Platelet Fraction may be clinically indicated, consider ordering this additional test [id number] CONSISTENT WITH PREVIOUS RESULT REPEATED TO VERIFY    nRBC 0.0 0.0 - 0.2 %    Comment: Performed at [facility], [address] [place], [place] [address]  Comprehensive metabolic panel     Status: Abnormal   Collection Time: 10/24/21  4:11 AM  Result Value Ref Range   Sodium 138 135 - 145 mmol/L   Potassium 3.7 3.5 - 5.1 mmol/L   Chloride 99 98 - 111 mmol/L   CO2 23 22 - 32 mmol/L   Glucose, Bld 374 (H) 70 - 99 mg/dL    Comment: Glucose reference range applies only to samples taken after fasting for at least 8 hours.   BUN 37 (H) 8 - 23 mg/dL   Creatinine, Ser 3.19 (H) 0.61 - 1.24 mg/dL   Calcium 7.1 (L) 8.9 - 10.3 mg/dL   Total Protein 4.8 (L) 6.5 - 8.1 g/dL   Albumin 2.3 (L) 3.5 - 5.0 g/dL   AST 1,557 (H) 15 - 41 U/L   ALT 849 (H) 0 - 44 U/L   Alkaline Phosphatase 106 38 - 126 U/L   Total Bilirubin 3.7 (H) 0.3 - 1.2 mg/dL   GFR, Estimated 19 (L) >60 mL/min    Comment: (NOTE) Calculated using the CKD-EPI Creatinine Equation (2021)    Anion gap 16 (H) 5 - 15    Comment: Performed at [facility], [place] [address], [place], [place] [address]  Lipase, blood     Status: Abnormal   Collection Time: 10/24/21  4:11 AM  Result Value Ref Range   Lipase 109 (H) 11 - 51 U/L    Comment: Performed at [facility], [place] [address], [place], [place] [address]  Lactic acid, plasma     Status: Abnormal   Collection Time: 10/24/21  4:11 AM  Result Value Ref Range   Lactic Acid, Venous 5.1 (HH) 0.5 - 1.9 mmol/L    Comment: CRITICAL VALUE NOTED.  VALUE IS CONSISTENT WITH PREVIOUSLY REPORTED AND CALLED  VALUE. Performed at [facility], [place] [address], [place], [place] [address]   Glucose, capillary     Status: Abnormal   Collection Time: 10/24/21  7:34 AM  Result Value Ref Range   Glucose-Capillary 141 (H) 70 - 99 mg/dL    Comment: Glucose reference range applies only to samples taken after fasting for at least 8 hours.  Lactic acid, plasma     Status: Abnormal   Collection Time: 10/24/21  7:36 AM  Result Value Ref Range   Lactic Acid, Venous 5.7 (HH) 0.5 - 1.9 mmol/L    Comment: CRITICAL VALUE NOTED.  VALUE IS CONSISTENT WITH PREVIOUSLY REPORTED AND CALLED VALUE. Performed at [facility], [place] [address], [place], [place] [address]   Glucose, capillary     Status: Abnormal   Collection Time: 10/24/21 11:44 AM  Result Value Ref Range   Glucose-Capillary 112 (H) 70 - 99 mg/dL    Comment: Glucose reference range applies only to samples taken after fasting for at least 8 hours.  Lactic acid, plasma     Status: Abnormal   Collection Time: 10/24/21 11:59 AM  Result Value Ref Range   Lactic Acid, Venous 5.4 (HH) 0.5 - 1.9 mmol/L    Comment: CRITICAL VALUE NOTED.  VALUE IS CONSISTENT WITH PREVIOUSLY REPORTED AND CALLED VALUE. Performed at [facility], [place] [address] [place], [place] [address]   Culture, blood (routine x 2)     Status: None (Preliminary result)   Collection Time: 10/24/21 12:00 PM   Specimen: BLOOD  Result Value Ref Range   Specimen Description BLOOD RIGHT ANTECUBITAL  Special Requests      BOTTLES DRAWN AEROBIC AND ANAEROBIC Blood Culture results may not be optimal due to an inadequate volume of blood received in culture bottles   Culture      NO GROWTH < 24 HOURS Performed at [facility] [address] [place], [place] [address]    Report Status PENDING   Culture, blood (routine x 2)     Status: None (Preliminary result)   Collection Time: 10/24/21 12:03 PM   Specimen: BLOOD  Result Value Ref Range   Specimen Description BLOOD  RIGHT ANTECUBITAL    Special Requests      BOTTLES DRAWN AEROBIC AND ANAEROBIC Blood Culture adequate volume   Culture      NO GROWTH < 24 HOURS Performed at [facility], [place] [address] [place], [place] [address]    Report Status PENDING   Lactic acid, plasma     Status: Abnormal   Collection Time: 10/24/21  3:08 PM  Result Value Ref Range   Lactic Acid, Venous 5.1 (HH) 0.5 - 1.9 mmol/L    Comment: CRITICAL VALUE NOTED.  VALUE IS CONSISTENT WITH PREVIOUSLY REPORTED AND CALLED VALUE. Performed at [facility], [place] [address], [place], [place] [address]   Glucose, capillary     Status: None   Collection Time: 10/24/21  3:37 PM  Result Value Ref Range   Glucose-Capillary 96 70 - 99 mg/dL    Comment: Glucose reference range applies only to samples taken after fasting for at least 8 hours.  Glucose, capillary     Status: None   Collection Time: 10/24/21  7:45 PM  Result Value Ref Range   Glucose-Capillary 95 70 - 99 mg/dL    Comment: Glucose reference range applies only to samples taken after fasting for at least 8 hours.  I-STAT 7, (LYTES, BLD GAS, ICA, H+H)     Status: Abnormal   Collection Time: 10/24/21 10:26 PM  Result Value Ref Range   pH, Arterial 7.435 7.350 - 7.450   pCO2 arterial 30.3 (L) 32.0 - 48.0 mmHg   pO2, Arterial 72 (L) 83.0 - 108.0 mmHg   Bicarbonate 20.4 20.0 - 28.0 mmol/L   TCO2 21 (L) 22 - 32 mmol/L   O2 Saturation 95.0 %   Acid-base deficit 3.0 (H) 0.0 - 2.0 mmol/L   Sodium 137 135 - 145 mmol/L   Potassium 4.2 3.5 - 5.1 mmol/L   Calcium, Ion 1.09 (L) 1.15 - 1.40 mmol/L   HCT 32.0 (L) 39.0 - 52.0 %   Hemoglobin 10.9 (L) 13.0 - 17.0 g/dL   Patient temperature 98.9 F    Collection site RADIAL, ALLEN'S TEST ACCEPTABLE    Drawn by RT    Sample type ARTERIAL   Glucose, capillary     Status: Abnormal   Collection Time: 10/24/21 11:07 PM  Result Value Ref Range   Glucose-Capillary 100 (H) 70 - 99 mg/dL    Comment: Glucose reference range applies  only to samples taken after fasting for at least 8 hours.  Comprehensive metabolic panel     Status: Abnormal   Collection Time: 10/25/21 12:29 AM  Result Value Ref Range   Sodium 140 135 - 145 mmol/L   Potassium 4.4 3.5 - 5.1 mmol/L   Chloride 102 98 - 111 mmol/L   CO2 21 (L) 22 - 32 mmol/L   Glucose, Bld 96 70 - 99 mg/dL    Comment: Glucose reference range applies only to samples  taken after fasting for at least 8 hours.   BUN 57 (H) 8 - 23 mg/dL   Creatinine, Ser 4.42 (H) 0.61 - 1.24 mg/dL   Calcium 8.4 (L) 8.9 - 10.3 mg/dL   Total Protein 5.6 (L) 6.5 - 8.1 g/dL   Albumin 2.6 (L) 3.5 - 5.0 g/dL   AST 1,104 (H) 15 - 41 U/L   ALT 905 (H) 0 - 44 U/L   Alkaline Phosphatase 150 (H) 38 - 126 U/L   Total Bilirubin 3.5 (H) 0.3 - 1.2 mg/dL   GFR, Estimated 13 (L) >60 mL/min    Comment: (NOTE) Calculated using the CKD-EPI Creatinine Equation (2021)    Anion gap 17 (H) 5 - 15    Comment: Performed at [facility], [place] [address], [place], [place] [address]  CBC with Differential/Platelet     Status: Abnormal   Collection Time: 10/25/21 12:29 AM  Result Value Ref Range   WBC 9.3 4.0 - 10.5 K/uL   RBC 3.78 (L) 4.22 - 5.81 MIL/uL   Hemoglobin 11.1 (L) 13.0 - 17.0 g/dL   HCT 33.7 (L) 39.0 - 52.0 %   MCV 89.2 80.0 - 100.0 fL   MCH 29.4 26.0 - 34.0 pg   MCHC 32.9 30.0 - 36.0 g/dL   RDW 15.8 (H) 11.5 - 15.5 %   Platelets 63 (L) 150 - 400 K/uL    Comment: Immature Platelet Fraction may be clinically indicated, consider ordering this additional test [id number] CONSISTENT WITH PREVIOUS RESULT REPEATED TO VERIFY    nRBC 0.0 0.0 - 0.2 %   Neutrophils Relative % 91 %   Neutro Abs 8.4 (H) 1.7 - 7.7 K/uL   Lymphocytes Relative 4 %   Lymphs Abs 0.4 (L) 0.7 - 4.0 K/uL   Monocytes Relative 4 %   Monocytes Absolute 0.4 0.1 - 1.0 K/uL   Eosinophils Relative 0 %   Eosinophils Absolute 0.0 0.0 - 0.5 K/uL   Basophils Relative 0 %   Basophils Absolute 0.0 0.0 - 0.1 K/uL   Immature  Granulocytes 1 %   Abs Immature Granulocytes 0.09 (H) 0.00 - 0.07 K/uL    Comment: Performed at [facility] [address], [place], [place] [address]  Lactic acid, plasma     Status: Abnormal   Collection Time: 10/25/21 12:29 AM  Result Value Ref Range   Lactic Acid, Venous 3.0 (HH) 0.5 - 1.9 mmol/L    Comment: CRITICAL VALUE NOTED.  VALUE IS CONSISTENT WITH PREVIOUSLY REPORTED AND CALLED VALUE. Performed at [facility], [place] [address], [place], [place] [address]   Magnesium     Status: Abnormal   Collection Time: 10/25/21 12:29 AM  Result Value Ref Range   Magnesium 2.5 (H) 1.7 - 2.4 mg/dL    Comment: Performed at [facility] [address] [place], [place] [address]  Glucose, capillary     Status: Abnormal   Collection Time: 10/25/21  3:54 AM  Result Value Ref Range   Glucose-Capillary 111 (H) 70 - 99 mg/dL    Comment: Glucose reference range applies only to samples taken after fasting for at least 8 hours.  Glucose, capillary     Status: Abnormal   Collection Time: 10/25/21  7:50 AM  Result Value Ref Range   Glucose-Capillary 122 (H) 70 - 99 mg/dL    Comment: Glucose reference range applies only to samples taken after fasting for at least 8 hours.    ROS:  Pertinent items are noted in HPI.  Physical Exam: Vitals:   10/25/21 0600 10/25/21 0700  BP: (!) 142/66 (!) 141/71  Pulse: (!) 109 (!) 115  Resp: (!) 29 (!) 27  Temp:  98.7 F (37.1 C)  SpO2: 100% 100%     General exam: lethargic, somewhat uncomfortable  HENT: normocephalic, atraumatic  Respiratory system: Coarse bilaterally, no wheezing, or crackles Cardiovascular system: tachycardic, irregularly irregular , trace edema bilaterally Gastrointestinal system: protuberant, soft and nontender. Normal bowel sounds heard. Central nervous system: somnolent, awakens easily to voice but quickly falls asleep Skin: No rashes, lesions or ulcers  Assessment/Plan:  AKI on CKD stage 4 - Baseline cr  1.4. AKI likely multifactorial including sepsis, hypotension, afib, contrast load on admission. CT angio c/a/p with perinephric stranding and 66m calculus without hydronephrosis or obstruction  - s/p lasix 80 mg IV on 2/13 and albumin on 12/12, can try another dose of lasix with albumin -Urine output 625 mL in last 24 hours -avoid nephrotoxins - s/p foley placement 12/13, no obstruction on CT, bladder scans negative - daily RFP, strict I/os - No urgent indication for dialysis, if  renal function and urine output not improving may need progress to need dialysis  Sepsis 2/2 ecoli and step bateremia, and ascending cholangitis s/p ERCP -now on Unasyn -ID and GI consulted -TEE Friday  - follow cultures  Afib with RVT- Pafib not on anticoagulation at home - Now on heparin infusion and metoprolol - Management per primary and cardiology  Acute metabolic encephalopathy- likely in setting of sepsis, ativan, delirium  - avoid benzos -management per primary   Anemia- stable  -hgb 11.1 today - transfuse PRN  BPH- no signs of obstruction during this admission, per daughter PSA stable -continue tamsulosin - foley in place   JIona Beard MD IMT[facility] PGY-2 10/25/2021, 8:41 AM

## 2021-10-25 NOTE — Progress Notes (Addendum)
Progress Note  Patient Name: [name] Young. Date of Encounter: 10/25/2021  Primary Cardiologist: Nelva Bush, MD   Subjective   Patient see and examined at his bedside.  He is with altered mental status. No complaints overnight events patient converted to atrial fibrillation with rapid ventricular rate from her sinus rhythm yesterday.   Inpatient Medications    Scheduled Meds:  chlorhexidine  15 mL Mouth Rinse BID   Chlorhexidine Gluconate Cloth  6 each Topical Q0600   insulin aspart  0-15 Units Subcutaneous Q4H   mouth rinse  15 mL Mouth Rinse q12n4p   metoprolol tartrate  5 mg Intravenous Q6H   tamsulosin  0.4 mg Oral Daily   Continuous Infusions:  ampicillin-sulbactam (UNASYN) IV     diltiazem (CARDIZEM) infusion 10 mg/hr (10/25/21 0838)   heparin 1,400 Units/hr (10/25/21 0838)   PRN Meds: docusate sodium, haloperidol lactate, ondansetron (ZOFRAN) IV, polyethylene glycol   Vital Signs    Vitals:   10/25/21 0730 10/25/21 0800 10/25/21 0830 10/25/21 0900  BP: 131/65 121/65 (!) 155/80 111/62  Pulse: (!) 102 (!) 118 (!) 118 (!) 111  Resp: (!) 28 (!) 31 (!) 31 (!) 26  Temp:      TempSrc:      SpO2: 98% 100% 100% 100%  Weight:      Height:        Intake/Output Summary (Last 24 hours) at 10/25/2021 5885 Last data filed at 10/25/2021 0277 Gross per 24 hour  Intake 1355.13 ml  Output 940 ml  Net 415.13 ml   Filed Weights   10/23/21 0340 10/25/21 0414  Weight: 104.3 kg 118.2 kg    Telemetry    Atrial fibrillation with ventricular rate- Personally Reviewed  ECG    Patient fibrillation with rapid ventricular rate- Personally Reviewed  Physical Exam    General: Not in acute distress, with altered mental status, oriented to self Head: Atraumatic, normal size  Eyes: PEERLA, EOMI  Neck: Supple, normal JVD Cardiac: Normal S1, S2; RRR; no murmurs, rubs, or gallops Lungs: Clear to auscultation bilaterally Abd: Soft, nontender, no hepatomegaly  Ext:  warm, no edema Musculoskeletal: No deformities, BUE and BLE strength normal and equal Skin: Warm and dry, no rashes   Neuro: Awake, open his eyes to voice and oriented to self   Labs    Chemistry Recent Labs  Lab 10/23/21 0334 10/23/21 0431 10/23/21 2154 10/24/21 0411 10/24/21 2226 10/25/21 0029  NA 137   < > 136 138 137 140  K 4.9   < > 4.1 3.7 4.2 4.4  CL 106  --  105 99  --  102  CO2 13*  --  14* 23  --  21*  GLUCOSE 219*  --  108* 374*  --  96  BUN 24*  --  34* 37*  --  57*  CREATININE 1.91*   < > 2.67* 3.19*  --  4.42*  CALCIUM 9.1  --  7.6* 7.1*  --  8.4*  PROT 6.1*  --   --  4.8*  --  5.6*  ALBUMIN 3.1*  --   --  2.3*  --  2.6*  AST 532*  --   --  1,557*  --  1,104*  ALT 235*  --   --  849*  --  905*  ALKPHOS 149*  --   --  106  --  150*  BILITOT 5.9*  --   --  3.7*  --  3.5*  GFRNONAA  35*   < > 24* 19*  --  13*  ANIONGAP 18*  --  17* 16*  --  17*   < > = values in this interval not displayed.     Hematology Recent Labs  Lab 10/23/21 2154 10/24/21 0411 10/24/21 2226 10/25/21 0029  WBC 16.2* 11.4*  --  9.3  RBC 3.21* 3.27*  --  3.78*  HGB 9.5* 9.4* 10.9* 11.1*  HCT 30.6* 30.2* 32.0* 33.7*  MCV 95.3 92.4  --  89.2  MCH 29.6 28.7  --  29.4  MCHC 31.0 31.1  --  32.9  RDW 15.7* 15.6*  --  15.8*  PLT 96* 75*  --  63*    Cardiac EnzymesNo results for input(s): TROPONINI in the last 168 hours. No results for input(s): TROPIPOC in the last 168 hours.   BNPNo results for input(s): BNP, PROBNP in the last 168 hours.   DDimer No results for input(s): DDIMER in the last 168 hours.   Radiology    DG ERCP WITH SPHINCTEROTOMY  Result Date: 10/23/2021 CLINICAL DATA:  Chest and back pain Cholelithiasis Dilated CBD on CT EXAM: ERCP TECHNIQUE: Multiple spot images obtained with the fluoroscopic device and submitted for interpretation post-procedure. FLUOROSCOPY: Radiation Exposure Index (as provided by the fluoroscopic device): 65.5 mGy Kerma COMPARISON:  None.  FINDINGS: Thirteen images were submitted for interpretation. The submitted images demonstrate cannulation of the common bile duct. A filling defect is seen in the mid duct on the initial images which is no longer identified following balloon sweep. IMPRESSION: Intraoperative fluoroscopic images of ERCP as above. These images were submitted for radiologic interpretation only. Please see the procedural report for the amount of contrast and the fluoroscopy time utilized. Electronically Signed   By: Miachel Roux M.D.   On: 10/23/2021 11:45   ECHOCARDIOGRAM COMPLETE  Result Date: 10/23/2021    ECHOCARDIOGRAM REPORT   Patient Name:   [name] Young. Date of Exam: 10/23/2021 Medical Rec #:  [id number]            Height:       71.0 in Accession #:    [id number]           Weight:       230.0 lb Date of Birth:  1941/06/02           BSA:          2.238 m Patient Age:    81 years             BP:           96/53 mmHg Patient Gender: M                    HR:           92 bpm. Exam Location:  Inpatient Procedure: 2D Echo Indications:    dyspnea  History:        Patient has prior history of Echocardiogram examinations, most                 recent 01/18/2018. Prior CABG, Sepsis. Chronic kidney disease;                 Risk Factors:Diabetes, Dyslipidemia and Hypertension.  Sonographer:    Johny Chess RDCS Referring Phys: [place]  1. Left ventricular ejection fraction, by estimation, is 40 to 45%. The left ventricle has mildly decreased function. The left ventricle demonstrates global hypokinesis. Left ventricular diastolic parameters  are consistent with Grade II diastolic dysfunction (pseudonormalization). Elevated left ventricular end-diastolic pressure.  2. Right ventricular systolic function is normal. The right ventricular size is normal. There is normal pulmonary artery systolic pressure. The estimated right ventricular systolic pressure is 53.2 mmHg.  3. Left atrial size was mildly  dilated.  4. The mitral valve is degenerative. Trivial mitral valve regurgitation. No evidence of mitral stenosis.  5. The aortic valve is calcified. There is moderate calcification of the aortic valve. There is moderate thickening of the aortic valve. Aortic valve regurgitation is not visualized. Aortic valve sclerosis/calcification is present, without any evidence of aortic stenosis.  6. There is mild dilatation of the aortic root, measuring 41 mm. There is mild dilatation of the ascending aorta, measuring 38 mm.  7. The inferior vena cava is dilated in size with >50% respiratory variability, suggesting right atrial pressure of 8 mmHg. FINDINGS  Left Ventricle: Left ventricular ejection fraction, by estimation, is 40 to 45%. The left ventricle has mildly decreased function. The left ventricle demonstrates global hypokinesis. The left ventricular internal cavity size was normal in size. There is  no left ventricular hypertrophy. Left ventricular diastolic parameters are consistent with Grade II diastolic dysfunction (pseudonormalization). Elevated left ventricular end-diastolic pressure. Right Ventricle: The right ventricular size is normal. No increase in right ventricular wall thickness. Right ventricular systolic function is normal. There is normal pulmonary artery systolic pressure. The tricuspid regurgitant velocity is 2.02 m/s, and  with an assumed right atrial pressure of 8 mmHg, the estimated right ventricular systolic pressure is 99.2 mmHg. Left Atrium: Left atrial size was mildly dilated. Right Atrium: Right atrial size was normal in size. Pericardium: There is no evidence of pericardial effusion. Mitral Valve: The mitral valve is degenerative in appearance. There is moderate thickening of the mitral valve leaflet(s). There is moderate calcification of the mitral valve leaflet(s). Mild mitral annular calcification. Trivial mitral valve regurgitation. No evidence of mitral valve stenosis. Tricuspid Valve:  The tricuspid valve is normal in structure. Tricuspid valve regurgitation is trivial. No evidence of tricuspid stenosis. Aortic Valve: The aortic valve is calcified. There is moderate calcification of the aortic valve. There is moderate thickening of the aortic valve. Aortic valve regurgitation is not visualized. Aortic valve sclerosis/calcification is present, without any  evidence of aortic stenosis. Pulmonic Valve: The pulmonic valve was normal in structure. Pulmonic valve regurgitation is trivial. No evidence of pulmonic stenosis. Aorta: The aortic root is normal in size and structure. There is mild dilatation of the aortic root, measuring 41 mm. There is mild dilatation of the ascending aorta, measuring 38 mm. Venous: The inferior vena cava is dilated in size with greater than 50% respiratory variability, suggesting right atrial pressure of 8 mmHg. IAS/Shunts: No atrial level shunt detected by color flow Doppler.  LEFT VENTRICLE PLAX 2D LVIDd:         6.30 cm      Diastology LVIDs:         4.60 cm      LV e' medial:    5.87 cm/s LV PW:         1.10 cm      LV E/e' medial:  16.7 LV IVS:        1.00 cm      LV e' lateral:   7.29 cm/s LVOT diam:     2.60 cm      LV E/e' lateral: 13.5 LV SV:         73 LV SV  Index:   33 LVOT Area:     5.31 cm  LV Volumes (MOD) LV vol d, MOD A2C: 115.0 ml LV vol s, MOD A2C: 75.8 ml LV SV MOD A2C:     39.2 ml RIGHT VENTRICLE            IVC RV S prime:     9.03 cm/s  IVC diam: 2.60 cm TAPSE (M-mode): 1.3 cm LEFT ATRIUM             Index        RIGHT ATRIUM           Index LA diam:        4.30 cm 1.92 cm/m   RA Area:     17.00 cm LA Vol (A2C):   87.2 ml 38.97 ml/m  RA Volume:   43.90 ml  19.62 ml/m LA Vol (A4C):   66.9 ml 29.90 ml/m LA Biplane Vol: 77.2 ml 34.50 ml/m  AORTIC VALVE LVOT Vmax:   78.30 cm/s LVOT Vmean:  52.600 cm/s LVOT VTI:    0.138 m  AORTA Ao Root diam: 4.10 cm Ao Asc diam:  3.80 cm MITRAL VALVE               TRICUSPID VALVE MV Area (PHT): 4.49 cm    TR Peak  grad:   16.3 mmHg MV Decel Time: 169 msec    TR Vmax:        202.00 cm/s MV E velocity: 98.20 cm/s MV A velocity: 83.10 cm/s  SHUNTS MV E/A ratio:  1.18        Systemic VTI:  0.14 m                            Systemic Diam: 2.60 cm Fransico Him MD Electronically signed by Fransico Him MD Signature Date/Time: 10/23/2021/4:51:21 PM    Final     Cardiac Studies   TTE 10/23/2021 IMPRESSIONS     1. Left ventricular ejection fraction, by estimation, is 40 to 45%. The  left ventricle has mildly decreased function. The left ventricle  demonstrates global hypokinesis. Left ventricular diastolic parameters are  consistent with Grade II diastolic  dysfunction (pseudonormalization). Elevated left ventricular end-diastolic  pressure.   2. Right ventricular systolic function is normal. The right ventricular  size is normal. There is normal pulmonary artery systolic pressure. The  estimated right ventricular systolic pressure is 16.1 mmHg.   3. Left atrial size was mildly dilated.   4. The mitral valve is degenerative. Trivial mitral valve regurgitation.  No evidence of mitral stenosis.   5. The aortic valve is calcified. There is moderate calcification of the  aortic valve. There is moderate thickening of the aortic valve. Aortic  valve regurgitation is not visualized. Aortic valve  sclerosis/calcification is present, without any evidence  of aortic stenosis.   6. There is mild dilatation of the aortic root, measuring 41 mm. There is  mild dilatation of the ascending aorta, measuring 38 mm.   7. The inferior vena cava is dilated in size with >50% respiratory  variability, suggesting right atrial pressure of 8 mmHg.   FINDINGS   Left Ventricle: Left ventricular ejection fraction, by estimation, is 40  to 45%. The left ventricle has mildly decreased function. The left  ventricle demonstrates global hypokinesis. The left ventricular internal  cavity size was normal in size. There is   no left  ventricular hypertrophy. Left ventricular diastolic parameters  are consistent with Grade II diastolic dysfunction (pseudonormalization).  Elevated left ventricular end-diastolic pressure.   Right Ventricle: The right ventricular size is normal. No increase in  right ventricular wall thickness. Right ventricular systolic function is  normal. There is normal pulmonary artery systolic pressure. The tricuspid  regurgitant velocity is 2.02 m/s, and   with an assumed right atrial pressure of 8 mmHg, the estimated right  ventricular systolic pressure is 78.2 mmHg.   Left Atrium: Left atrial size was mildly dilated.   Right Atrium: Right atrial size was normal in size.   Pericardium: There is no evidence of pericardial effusion.   Mitral Valve: The mitral valve is degenerative in appearance. There is  moderate thickening of the mitral valve leaflet(s). There is moderate  calcification of the mitral valve leaflet(s). Mild mitral annular  calcification. Trivial mitral valve  regurgitation. No evidence of mitral valve stenosis.   Tricuspid Valve: The tricuspid valve is normal in structure. Tricuspid  valve regurgitation is trivial. No evidence of tricuspid stenosis.   Aortic Valve: The aortic valve is calcified. There is moderate  calcification of the aortic valve. There is moderate thickening of the  aortic valve. Aortic valve regurgitation is not visualized. Aortic valve  sclerosis/calcification is present, without any   evidence of aortic stenosis.   Pulmonic Valve: The pulmonic valve was normal in structure. Pulmonic valve  regurgitation is trivial. No evidence of pulmonic stenosis.   Aorta: The aortic root is normal in size and structure. There is mild  dilatation of the aortic root, measuring 41 mm. There is mild dilatation  of the ascending aorta, measuring 38 mm.   Venous: The inferior vena cava is dilated in size with greater than 50%  respiratory variability, suggesting right  atrial pressure of 8 mmHg.   IAS/Shunts: No atrial level shunt detected by color flow Doppler.       Patient Profile     81 y.o. male hx of 3v CAD with CABG 2019, with post op a fib, HTN, HLD, DM-2, CKD 3, now admitted with abd pain due to biliary tract obstruction and ascending cholangitis undergoing ERCP removing 2 stones, sepsis and a fib who is being seen 10/24/2021 for the evaluation of atrial fib and new CM with EF 40-45%.  Assessment & Plan   Atrial fibrillation rapid ventricular rate-transitioning off Cardizem now on metoprolol tartrate IV.  Lenient rate approach given his sepsis .  Continue heparin drip for anticoagulation CHA2DS2-VASc score 4.  Recent echocardiogram with depressed ejection fraction EF 40 to 45%.  CAD-slightly elevated troponin in the setting of his infection.  Will reevaluate for need of ischemic evaluation once acute issues have resolved.  For now continue with medical therapy aspirin 81 mg daily, Crestor.  Depressed ejection fraction-does not appear to be volume overloaded.  Sepsis secondary to E. coli and strep to coccus species bacteremia in the setting of acute ascending cholangitis now status post ERCP-ID following on antibiotics ceftriaxone and metronidazole.  Culture is concerning discussed agree with plan for TEE to assess for vegetations.  Plan for TEE on Friday.  Acute metabolic encephalopathy-in the setting of his elevated liver enzymes sepsis, as well as kidney injury.  This is being managed by the ICU team.  Transaminitis managed by the primary team.    Type 2 diabetes AKI Chronic anemia  All managed by primary ICU team.  CRITICAL CARE Performed by: Berniece Salines  Total critical care time: 30 minutes. Critical care time was exclusive of separately  billable procedures and treating other patients. Critical care was necessary to treat or prevent imminent or life-threatening deterioration. Critical care was time spent personally by me on the  following activities: development of treatment plan with patient and/or surrogate as well as nursing, discussions with consultants, evaluation of patient's response to treatment, examination of patient, obtaining history from patient or surrogate, ordering and performing treatments and interventions, ordering and review of laboratory studies, ordering and review of radiographic studies, pulse oximetry and re-evaluation of patient's condition.   Signed, Berniece Salines, DO [facility]  10/25/2021 10:43 AM     For questions or updates, please contact [facility] Please consult [email or web address] for contact info under Cardiology/STEMI.

## 2021-10-25 NOTE — Progress Notes (Addendum)
Attending physician's note   I have taken a history, reviewed the chart, and examined the patient. I performed a substantive portion of this encounter, including complete performance of at least one of the key components, in conjunction with the APP. I agree with the APP's note, impression, and recommendations with my edits.   AMS vs sundowning overnight.  Did have Ativan yesterday, also possibly component of delayed BZD clearance.  - AST/ALT/ALP 1104/904/150 (yesterday 1557/849/106) - T. bili 3.5 (from 3.7 yesterday and 5.9 the day prior) - WBC 9.3 (from 11.4) - Lactate downtrending at 3 (from 5.1) - Acute viral hepatitis panel negative  Patient does not provide history today.  Discussed with RN at bedside.  Exam with hypoactive bowel sounds.  Does have TTP in upper abdomen.  1) Ascending cholangitis 2) Choledocholithiasis 3) Sepsis -s/p ERCP on 10/23/2021 with sphincterotomy and balloon sweeps, with removal of 2 stones, sludge, and pus - Continue ABX per primary [facility] consulting ID service - If increasing abdominal pain or other concerns, will check lipase and repeat cross sectional imaging - IVF as able - Per ID service, as one of the organisms on blood culture was Strep infantarius, will need eventual colonoscopy when more clinically stable to r/o colonic neoplasm  4) Elevated liver enzymes - AST and Tbili downtrending, but ALT and ALP slightly up from yesterday - Likely multifactorial related to underlying sepsis, medication effect - Continue trending enzymes daily  5) AMS - Again, multifactorial with sepsis, sundowning, and possibly med effect  6) CAD s/p CABG 2019 7) Afib w/ RVR - On heparin gtt. No e/o overt GIB and H/H stable  GI service will continue to follow    Gerrit Heck, DO, FACG [phone] office          Progress Note   Subjective  Chief Complaint: Abdominal pain, elevated LFTs, choledocholithiasis/cholecystitis with sepsis, status post  ERCP 10/23/2021  This morning patient is found with altered mental status, apparently had some sundowning yesterday and was given Ativan.  His daughter is by his bedside and has questions.   Objective   Vital signs in last 24 hours: Temp:  [97.7 F (36.5 C)-98.9 F (37.2 C)] 98.7 F (37.1 C) (02/14 0700) Pulse Rate:  [72-119] 111 (02/14 0900) Resp:  [16-33] 26 (02/14 0900) BP: (108-155)/(46-87) 111/62 (02/14 0900) SpO2:  [92 %-100 %] 100 % (02/14 0900) Weight:  [118.2 kg] 118.2 kg (02/14 0414) Last BM Date :  (pta) General:    white male in NAD Heart:  Regular rate and rhythm; no murmurs Lungs: Respirations even and unlabored, lungs CTA bilaterally Abdomen:  Soft, some wincing with palpation of the epigastrium and nondistended. Normal bowel sounds. Psych:  Cooperative. Normal mood and affect.  Intake/Output from previous day: 02/13 0701 - 02/14 0700 In: 1877 [P.O.:220; I.V.:1209.7; IV Piggyback:357.3] Out: 625 [Urine:625] Intake/Output this shift: Total I/O In: 37.7 [I.V.:37.7] Out: 465 [Urine:465]  Lab Results: Recent Labs    10/23/21 2154 10/24/21 0411 10/24/21 2226 10/25/21 0029  WBC 16.2* 11.4*  --  9.3  HGB 9.5* 9.4* 10.9* 11.1*  HCT 30.6* 30.2* 32.0* 33.7*  PLT 96* 75*  --  63*   BMET Recent Labs    10/23/21 2154 10/24/21 0411 10/24/21 2226 10/25/21 0029  NA 136 138 137 140  K 4.1 3.7 4.2 4.4  CL 105 99  --  102  CO2 14* 23  --  21*  GLUCOSE 108* 374*  --  96  BUN 34*  37*  --  57*  CREATININE 2.67* 3.19*  --  4.42*  CALCIUM 7.6* 7.1*  --  8.4*   LFT Recent Labs    10/25/21 0029  PROT 5.6*  ALBUMIN 2.6*  AST 1,104*  ALT 905*  ALKPHOS 150*  BILITOT 3.5*   PT/INR Recent Labs    10/23/21 0334  LABPROT 13.7  INR 1.1    Studies/Results: DG ERCP WITH SPHINCTEROTOMY  Result Date: 10/23/2021 CLINICAL DATA:  Chest and back pain Cholelithiasis Dilated CBD on CT EXAM: ERCP TECHNIQUE: Multiple spot images obtained with the fluoroscopic device  and submitted for interpretation post-procedure. FLUOROSCOPY: Radiation Exposure Index (as provided by the fluoroscopic device): 65.5 mGy Kerma COMPARISON:  None. FINDINGS: Thirteen images were submitted for interpretation. The submitted images demonstrate cannulation of the common bile duct. A filling defect is seen in the mid duct on the initial images which is no longer identified following balloon sweep. IMPRESSION: Intraoperative fluoroscopic images of ERCP as above. These images were submitted for radiologic interpretation only. Please see the procedural report for the amount of contrast and the fluoroscopy time utilized. Electronically Signed   By: Miachel Roux M.D.   On: 10/23/2021 11:45   ECHOCARDIOGRAM COMPLETE  Result Date: 10/23/2021    ECHOCARDIOGRAM REPORT   Patient Name:   [name] Young. Date of Exam: 10/23/2021 Medical Rec #:  [id number]            Height:       71.0 in Accession #:    [id number]           Weight:       230.0 lb Date of Birth:  22-Jul-1941           BSA:          2.238 m Patient Age:    32 years             BP:           96/53 mmHg Patient Gender: M                    HR:           92 bpm. Exam Location:  Inpatient Procedure: 2D Echo Indications:    dyspnea  History:        Patient has prior history of Echocardiogram examinations, most                 recent 01/18/2018. Prior CABG, Sepsis. Chronic kidney disease;                 Risk Factors:Diabetes, Dyslipidemia and Hypertension.  Sonographer:    Johny Chess RDCS Referring Phys: [place]  1. Left ventricular ejection fraction, by estimation, is 40 to 45%. The left ventricle has mildly decreased function. The left ventricle demonstrates global hypokinesis. Left ventricular diastolic parameters are consistent with Grade II diastolic dysfunction (pseudonormalization). Elevated left ventricular end-diastolic pressure.  2. Right ventricular systolic function is normal. The right ventricular size is  normal. There is normal pulmonary artery systolic pressure. The estimated right ventricular systolic pressure is 64.6 mmHg.  3. Left atrial size was mildly dilated.  4. The mitral valve is degenerative. Trivial mitral valve regurgitation. No evidence of mitral stenosis.  5. The aortic valve is calcified. There is moderate calcification of the aortic valve. There is moderate thickening of the aortic valve. Aortic valve regurgitation is not visualized. Aortic valve sclerosis/calcification is present, without any evidence of  aortic stenosis.  6. There is mild dilatation of the aortic root, measuring 41 mm. There is mild dilatation of the ascending aorta, measuring 38 mm.  7. The inferior vena cava is dilated in size with >50% respiratory variability, suggesting right atrial pressure of 8 mmHg. FINDINGS  Left Ventricle: Left ventricular ejection fraction, by estimation, is 40 to 45%. The left ventricle has mildly decreased function. The left ventricle demonstrates global hypokinesis. The left ventricular internal cavity size was normal in size. There is  no left ventricular hypertrophy. Left ventricular diastolic parameters are consistent with Grade II diastolic dysfunction (pseudonormalization). Elevated left ventricular end-diastolic pressure. Right Ventricle: The right ventricular size is normal. No increase in right ventricular wall thickness. Right ventricular systolic function is normal. There is normal pulmonary artery systolic pressure. The tricuspid regurgitant velocity is 2.02 m/s, and  with an assumed right atrial pressure of 8 mmHg, the estimated right ventricular systolic pressure is 48.5 mmHg. Left Atrium: Left atrial size was mildly dilated. Right Atrium: Right atrial size was normal in size. Pericardium: There is no evidence of pericardial effusion. Mitral Valve: The mitral valve is degenerative in appearance. There is moderate thickening of the mitral valve leaflet(s). There is moderate calcification of  the mitral valve leaflet(s). Mild mitral annular calcification. Trivial mitral valve regurgitation. No evidence of mitral valve stenosis. Tricuspid Valve: The tricuspid valve is normal in structure. Tricuspid valve regurgitation is trivial. No evidence of tricuspid stenosis. Aortic Valve: The aortic valve is calcified. There is moderate calcification of the aortic valve. There is moderate thickening of the aortic valve. Aortic valve regurgitation is not visualized. Aortic valve sclerosis/calcification is present, without any  evidence of aortic stenosis. Pulmonic Valve: The pulmonic valve was normal in structure. Pulmonic valve regurgitation is trivial. No evidence of pulmonic stenosis. Aorta: The aortic root is normal in size and structure. There is mild dilatation of the aortic root, measuring 41 mm. There is mild dilatation of the ascending aorta, measuring 38 mm. Venous: The inferior vena cava is dilated in size with greater than 50% respiratory variability, suggesting right atrial pressure of 8 mmHg. IAS/Shunts: No atrial level shunt detected by color flow Doppler.  LEFT VENTRICLE PLAX 2D LVIDd:         6.30 cm      Diastology LVIDs:         4.60 cm      LV e' medial:    5.87 cm/s LV PW:         1.10 cm      LV E/e' medial:  16.7 LV IVS:        1.00 cm      LV e' lateral:   7.29 cm/s LVOT diam:     2.60 cm      LV E/e' lateral: 13.5 LV SV:         73 LV SV Index:   33 LVOT Area:     5.31 cm  LV Volumes (MOD) LV vol d, MOD A2C: 115.0 ml LV vol s, MOD A2C: 75.8 ml LV SV MOD A2C:     39.2 ml RIGHT VENTRICLE            IVC RV S prime:     9.03 cm/s  IVC diam: 2.60 cm TAPSE (M-mode): 1.3 cm LEFT ATRIUM             Index        RIGHT ATRIUM  Index LA diam:        4.30 cm 1.92 cm/m   RA Area:     17.00 cm LA Vol (A2C):   87.2 ml 38.97 ml/m  RA Volume:   43.90 ml  19.62 ml/m LA Vol (A4C):   66.9 ml 29.90 ml/m LA Biplane Vol: 77.2 ml 34.50 ml/m  AORTIC VALVE LVOT Vmax:   78.30 cm/s LVOT Vmean:  52.600  cm/s LVOT VTI:    0.138 m  AORTA Ao Root diam: 4.10 cm Ao Asc diam:  3.80 cm MITRAL VALVE               TRICUSPID VALVE MV Area (PHT): 4.49 cm    TR Peak grad:   16.3 mmHg MV Decel Time: 169 msec    TR Vmax:        202.00 cm/s MV E velocity: 98.20 cm/s MV A velocity: 83.10 cm/s  SHUNTS MV E/A ratio:  1.18        Systemic VTI:  0.14 m                            Systemic Diam: 2.60 cm Fransico Him MD Electronically signed by Fransico Him MD Signature Date/Time: 10/23/2021/4:51:21 PM    Final      Assessment / Plan:   Assessment: 1.  Cholangitis with choledocholithiasis: Status post ERCP with sphincterotomy and stone extraction 2/12, mild epigastric pain today on exam though patient with AMS, LFTs seem to be trending down, sepsis secondary to E. coli and strep, plan for TEE Friday 2.  A-fib 3.  History of CAD status post three-vessel CABG in 2019 4.  AKI on CKD: Nephrology following 5.  Acute metabolic encephalopathy: In the setting of sepsis as well as kidney injury  Plan: 1.  Continue to monitor LFTs, they seem to be trending down some today 2.  Hold off on advancing diet 3.  Avoid aspirin and NSAIDs for a week 4.  Continue broad-spectrum antibiotics 5.  Again will need to consider eventual surgical consult for cholecystectomy     LOS: 2 days   Levin Erp  10/25/2021, 11:01 AM

## 2021-10-25 NOTE — Progress Notes (Signed)
Pharmacy Antibiotic Note  [name] Young. is a 81 y.o. male admitted on 10/23/2021 with E.coli and Strep infantarius bacteremia. Pharmacy has been consulted for Unasyn dosing.  Noted AKI with baseline SCr 1.3-1.5 however SCr currently rising and up to 4.42, UOP/24h 0.2 m/kg/hr, estimated CrCl<20 ml/min.   Plan: - Start Unasyn 3g IV every 12 hours - Will continue to follow renal function, culture results, LOT, and antibiotic de-escalation plans   Height: 5\' 11"  (180.3 cm) Weight: 118.2 kg (260 lb 9.3 oz) IBW/kg (Calculated) : 75.3  Temp (24hrs), Avg:98.6 F (37 C), Min:97.7 F (36.5 C), Max:98.9 F (37.2 C)  Recent Labs  Lab 10/23/21 0334 10/23/21 0336 10/23/21 0933 10/23/21 1446 10/23/21 2154 10/24/21 0411 10/24/21 0736 10/24/21 1159 10/24/21 1508 10/25/21 0029  WBC 7.9  --  14.8*  --  16.2* 11.4*  --   --   --  9.3  CREATININE 1.91*  --  2.11*  --  2.67* 3.19*  --   --   --  4.42*  LATICACIDVEN  --    < > 7.8*   < >  --  5.1* 5.7* 5.4* 5.1* 3.0*   < > = values in this interval not displayed.    Estimated Creatinine Clearance: 17.7 mL/min (A) (by C-G formula based on SCr of 4.42 mg/dL (H)).    No Known Allergies  Antimicrobials this admission: Zosyn 2/12 x 1 Rocephin 2/12 >> 2/14 Flagyl 2/12 >> 2/14 Unasyn 2/14 >>  Dose adjustments this admission: N/a  Microbiology results: 2/12 BCx >> 4/4 E.coli (pan-S) + Strep infantarius (S-PCN/CTX/Vanc, R-LVQ/erythromycin)  Thank you for allowing pharmacy to be a part of this patients care.  Alycia Rossetti, PharmD, BCPS Clinical Pharmacist 10/25/2021 10:51 AM   **Pharmacist phone directory can now be found on [email or web address] ([email or web address]).  Listed under [place].

## 2021-10-26 ENCOUNTER — Ambulatory Visit: Payer: Medicare PPO | Admitting: Internal Medicine

## 2021-10-26 DIAGNOSIS — K8033 Calculus of bile duct with acute cholangitis with obstruction: Secondary | ICD-10-CM | POA: Diagnosis not present

## 2021-10-26 DIAGNOSIS — I4891 Unspecified atrial fibrillation: Secondary | ICD-10-CM | POA: Diagnosis not present

## 2021-10-26 DIAGNOSIS — R7989 Other specified abnormal findings of blood chemistry: Secondary | ICD-10-CM | POA: Diagnosis not present

## 2021-10-26 DIAGNOSIS — B955 Unspecified streptococcus as the cause of diseases classified elsewhere: Secondary | ICD-10-CM | POA: Diagnosis not present

## 2021-10-26 DIAGNOSIS — N179 Acute kidney failure, unspecified: Secondary | ICD-10-CM | POA: Diagnosis not present

## 2021-10-26 DIAGNOSIS — K8309 Other cholangitis: Secondary | ICD-10-CM | POA: Diagnosis not present

## 2021-10-26 DIAGNOSIS — R7881 Bacteremia: Secondary | ICD-10-CM | POA: Diagnosis not present

## 2021-10-26 DIAGNOSIS — G9341 Metabolic encephalopathy: Secondary | ICD-10-CM | POA: Diagnosis not present

## 2021-10-26 DIAGNOSIS — I2510 Atherosclerotic heart disease of native coronary artery without angina pectoris: Secondary | ICD-10-CM | POA: Diagnosis not present

## 2021-10-26 LAB — CBC
HCT: 31.2 % — ABNORMAL LOW (ref 39.0–52.0)
Hemoglobin: 10.2 g/dL — ABNORMAL LOW (ref 13.0–17.0)
MCH: 29.1 pg (ref 26.0–34.0)
MCHC: 32.7 g/dL (ref 30.0–36.0)
MCV: 88.9 fL (ref 80.0–100.0)
Platelets: 72 10*3/uL — ABNORMAL LOW (ref 150–400)
RBC: 3.51 MIL/uL — ABNORMAL LOW (ref 4.22–5.81)
RDW: 15.9 % — ABNORMAL HIGH (ref 11.5–15.5)
WBC: 12 10*3/uL — ABNORMAL HIGH (ref 4.0–10.5)
nRBC: 0 % (ref 0.0–0.2)

## 2021-10-26 LAB — COMPREHENSIVE METABOLIC PANEL
ALT: 515 U/L — ABNORMAL HIGH (ref 0–44)
AST: 357 U/L — ABNORMAL HIGH (ref 15–41)
Albumin: 2.7 g/dL — ABNORMAL LOW (ref 3.5–5.0)
Alkaline Phosphatase: 141 U/L — ABNORMAL HIGH (ref 38–126)
Anion gap: 16 — ABNORMAL HIGH (ref 5–15)
BUN: 71 mg/dL — ABNORMAL HIGH (ref 8–23)
CO2: 19 mmol/L — ABNORMAL LOW (ref 22–32)
Calcium: 7.8 mg/dL — ABNORMAL LOW (ref 8.9–10.3)
Chloride: 106 mmol/L (ref 98–111)
Creatinine, Ser: 3.65 mg/dL — ABNORMAL HIGH (ref 0.61–1.24)
GFR, Estimated: 16 mL/min — ABNORMAL LOW (ref >60)
Glucose, Bld: 114 mg/dL — ABNORMAL HIGH (ref 70–99)
Potassium: 3.6 mmol/L (ref 3.5–5.1)
Sodium: 141 mmol/L (ref 135–145)
Total Bilirubin: 1.9 mg/dL — ABNORMAL HIGH (ref 0.3–1.2)
Total Protein: 5.5 g/dL — ABNORMAL LOW (ref 6.5–8.1)

## 2021-10-26 LAB — GLUCOSE, CAPILLARY
Glucose-Capillary: 114 mg/dL — ABNORMAL HIGH (ref 70–99)
Glucose-Capillary: 116 mg/dL — ABNORMAL HIGH (ref 70–99)
Glucose-Capillary: 117 mg/dL — ABNORMAL HIGH (ref 70–99)
Glucose-Capillary: 119 mg/dL — ABNORMAL HIGH (ref 70–99)
Glucose-Capillary: 121 mg/dL — ABNORMAL HIGH (ref 70–99)
Glucose-Capillary: 129 mg/dL — ABNORMAL HIGH (ref 70–99)

## 2021-10-26 LAB — HEPARIN LEVEL (UNFRACTIONATED)
Heparin Unfractionated: <0.1 IU/mL — ABNORMAL LOW (ref 0.30–0.70)
Heparin Unfractionated: <0.1 IU/mL — ABNORMAL LOW (ref 0.30–0.70)
Heparin Unfractionated: <0.1 [IU]/mL — ABNORMAL LOW (ref 0.30–0.70)
Heparin Unfractionated: <0.1 [IU]/mL — ABNORMAL LOW (ref 0.30–0.70)

## 2021-10-26 LAB — AMMONIA: Ammonia: 26 umol/L (ref 9–35)

## 2021-10-26 LAB — MAGNESIUM: Magnesium: 2.6 mg/dL — ABNORMAL HIGH (ref 1.7–2.4)

## 2021-10-26 MED ORDER — HYDRALAZINE HCL 20 MG/ML IJ SOLN
10.0000 mg | INTRAMUSCULAR | Status: DC | PRN
Start: 2021-10-26 — End: 2021-11-06
  Administered 2021-10-27: 02:00:00 10 mg via INTRAVENOUS
  Filled 2021-10-26 (×2): qty 1

## 2021-10-26 MED ORDER — ALBUMIN HUMAN 25 % IV SOLN
25.0000 g | Freq: Four times a day (QID) | INTRAVENOUS | Status: AC
  Administered 2021-10-26 (×2): 25 g via INTRAVENOUS
  Filled 2021-10-26 (×2): qty 100

## 2021-10-26 MED ORDER — LACTULOSE 10 GM/15ML PO SOLN
30.0000 g | Freq: Every day | ORAL | Status: DC
  Administered 2021-10-27 – 2021-10-28 (×2): 30 g via ORAL
  Administered 2021-11-01: 10 g via ORAL
  Administered 2021-11-02 – 2021-11-03 (×2): 30 g via ORAL
  Filled 2021-10-26 (×8): qty 45

## 2021-10-26 MED ORDER — LACTULOSE 10 GM/15ML PO SOLN
30.0000 g | Freq: Every day | ORAL | Status: DC
  Filled 2021-10-26: qty 45

## 2021-10-26 NOTE — Progress Notes (Signed)
ANTICOAGULATION CONSULT NOTE - Initial Consult  Pharmacy Consult for Heparin Indication: atrial fibrillation  No Known Allergies  Patient Measurements: Height: 5\' 11"  (180.3 cm) Weight: 119.2 kg (262 lb 12.6 oz) IBW/kg (Calculated) : 75.3 Heparin Dosing Weight: 100 kg  Vital Signs: Temp: 99.2 F (37.3 C) (02/15 1200) Temp Source: Axillary (02/15 1200) BP: 146/76 (02/15 0600) Pulse Rate: 79 (02/15 0630)  Labs: Recent Labs    10/24/21 0411 10/24/21 2226 10/25/21 0029 10/25/21 1448 10/26/21 0112 10/26/21 0710 10/26/21 1243  HGB 9.4* 10.9* 11.1*  --  10.2*  --   --   HCT 30.2* 32.0* 33.7*  --  31.2*  --   --   PLT 75*  --  63*  --  72*  --   --   HEPARINUNFRC  --   --   --    < > <0.10* <0.10* <0.10*  CREATININE 3.19*  --  4.42*  --  3.65*  --   --    < > = values in this interval not displayed.     Estimated Creatinine Clearance: 21.6 mL/min (A) (by C-G formula based on SCr of 3.65 mg/dL (H)).   Medical History: Past Medical History:  Diagnosis Date   Arthritis    Cataract    lens implant bilateral   Chronic kidney disease    kidney stones   Coronary artery disease    Dyspnea    Dysrhythmia    GERD (gastroesophageal reflux disease)    History of cholelithiasis    History of kidney stones    ca ox [name] @ [facility]) now [employer]   History of pneumonia    HLD (hyperlipidemia)    HTN (hypertension)    Jaundice    age 21   Pneumonia    years ago    T2DM (type 2 diabetes mellitus) ([place]) 2010    Medications:  Scheduled:   chlorhexidine  15 mL Mouth Rinse BID   Chlorhexidine Gluconate Cloth  6 each Topical Q0600   insulin aspart  0-15 Units Subcutaneous Q4H   lactulose  30 g Oral Daily   mouth rinse  15 mL Mouth Rinse q12n4p   tamsulosin  0.4 mg Oral Daily    Assessment: 81 y.o. male admitted with cholangitis/sepsis, now with Afib, for heparin  Heparin level returned below goal at <0.10. Per RN no issues during infusion and no signs or symptoms  of bleeding.   Goal of Therapy:  Heparin level 0.3-0.7 units/ml Monitor platelets by anticoagulation protocol: Yes   Plan:  Increase heparin infusion to 2200 units/h Check heparin level in 8 hours Monitor CBC and sign/symptoms of bleeding  Lestine Box, PharmD PGY2 Infectious Diseases Pharmacy Resident   Please check [email or web address] for unit-specific pharmacy phone numbers

## 2021-10-26 NOTE — Progress Notes (Signed)
[facility] Progress Note Patient Name: [name] Young. DOB: 05/31/1941 MRN: [id number]   Date of Service  10/26/2021  HPI/Events of Note  Hypertension - BP = 165/84.  eICU Interventions  Plan: Hydralazine 10 mg IV Q 4 hours PRN SBP > 160 or DBP > 105.     Intervention Category Major Interventions: Hypertension - evaluation and management  Zarayah Lanting [place] 10/26/2021, 11:49 PM

## 2021-10-26 NOTE — Progress Notes (Addendum)
NAME:  [name] Young., MRN:  [id number], DOB:  1941/07/26, LOS: 3 ADMISSION DATE:  10/23/2021, CONSULTATION DATE:  10/23/2021 REFERRING MD: Orlena Sheldon, MD CHIEF COMPLAINT:  abdominal pain   History of Present Illness:  [name] Young is more responsive this morning on exam Jr. Is a 81 y.o. male with a PMH significant for CAD s/p CAGB 2019 A-fib,, HTN. HLD, diabetes, GERD, and CKD who presented to the ED for complaints of abdominal pain with associated nausea and vomiting  that began the day prior to admission at approximately 1600.    On ED arrival patient was seen in A-fib RVR, hypertensive, and tachypnea. Labwork significant for creatinine 1.91, elevated LFTs, lipase 390, total bill 5.9, lactic acid 8.9. CT head negative but CTA chest/abd/pelvis reveled dilated bile duct, acute cholelithiasis without cholecystis. GI was consulted and recommended urgent MRCP to better characterize biliary system. Given sepsis criteria on admission PCCM was consulted for further management and admission.   Pertinent  Medical History  CAD s/p CAGB 2019 A-fib,, HTN. HLD, diabetes, GERD, and CKD  Significant Hospital Events: Including procedures, antibiotic start and stop dates in addition to other pertinent events   2/12 Admitted with sepsis felt secondary to GI source  2/13 Initially given Zosyn then switched to Ceftriaxone and Metronidazole for bacteremia. Oliguric AKI. Confusion and trying to get out of bed , given one dose of ativan and continued on PRN Haldol 2/14 Antibiotics switched to Unasyn based on susceptibility.    Interim History / Subjective:  ID consult for bacteremia.  Patient started on Unasyn.  Cardiology has plans for TEE on Friday to assess for vegetations.  Given patient growing strep infantarius GI recommended future colonoscopy for rule out of colonic neoplasm. Nephrology consulted and started normal saline and concomitant albumin for 24 hours. Started back on amiodarone yesterday for A-fib  with RVR  This morning patient opens eyes to voice on exam.  He is thirsty, denies any abdominal pain.   Objective   Blood pressure (!) 146/76, pulse 81, temperature 98.1 F (36.7 C), temperature source Axillary, resp. rate (!) 23, height 5' 11"  (1.803 m), weight 119.2 kg, SpO2 99 %.        Intake/Output Summary (Last 24 hours) at 10/26/2021 0622 Last data filed at 10/26/2021 0600 Gross per 24 hour  Intake 1890.73 ml  Output 1600 ml  Net 290.73 ml    Filed Weights   10/23/21 0340 10/25/21 0414 10/26/21 0408  Weight: 104.3 kg 118.2 kg 119.2 kg    Examination: General: NAD, elderly man who has hearing impairment, wearing mits HEENT: Antiicteric sclerae, dry oral mucosa Cardiovascular: Normal rate, regular rhythm.  No murmurs, rubs, or gallops, no edema in lower ext Pulmonary : Effort normal, breath sounds normal. No wheezes, rales, or rhonchi Abdominal: soft, nontender,  bowel sounds present Musculoskeletal: protective heel pads in place, no deformity  Neuro: wakes up to voice. Not alert,oriented to person place time and situation.   Labs 2/15  WBC 9.3 > 12 Hemoglobin 9.1 > 10.2 Platelets 63 > 72  Serum creatinine 4.42 > 3.65 estimated GFR 13 > 16 CO2 21 >19  AST 1104 > 357 ALT 95 > 515 Alk phos 150 > 141  total bilirubin 3.5 > 1.9  Micro: Blood cultures 2/12: Streptococcus Infantarius, E.Coli -see sensitivities Blood cultures 2/13:  NGTD at 24 hour  Resolved Hospital Problem list     Assessment & Plan:  Sepsis secondary to E.coli and Streptococcus species bacteremia from ascending cholangitis  now s/p ERCP with two stones removed and sphincterotomy -Clinically patient is improving this morning and more alert than previous day.  -Source control with ERCP 2/12. LFTs trending down.  Patient afebrile for 24 hours.  Repeat blood cultures 2/13 negative to date.   - TTE did not show any vegetations, cardiology has plans for TEE on Friday. - Patient will eventually need  colonoscopy with GI.  Also will need surgical consult to discuss cholecystectomy. -Appreciate consulting services: GI, cardiology, and ID. -Continue Unasyn -Follow cultures  AKI with oliguria, on CKD stage 3A Baseline creatinine 1.4-1.6. --ATN in setting of sepsis.  - Nephrology consulting and patient was given albumin and normal saline for 24 hours.  He has had increased urine output, 1600 cc total in last 24 hours. Cr trending down.  - Trend kidney function, I/O's - If mental status is improving today patient can increase PO intake. If not would prefer to continue gentle hydration with IV fluids. - Appreciate Nephrology recommendations  BPH - Hx of BPH, not on treatment upon admission  - continue Tamsulosin - Outpatient follow up with his urologist.   Paroxysmal Atrial Fibrillation  - Patient in NSR, with frequent PVC's this morning. Hx of PVC's. - Mg 2.6 , K 3.6, will hold on repletion in setting of AKI, maintain K >3.5 - Continue amiodarone today. If patient stays in NSR and becomes more alert can transition back to home Toprolol-Xl   T2DM Hemoglobin A1c 6.1 on 10/23/21 -Sitagliptin and metformin at home, hold home medications in setting of AKI -CBGs 113 to 128, 6 units of aspart yesterday - SSI-M  Hx of CAD s/p CABG Hx of HTN/HLD HFmrEF - Home medications of Toprol-xl 25 mg, lisinopril 40 mg, ASA 81 mg, Crestor - global hypokinesis on Echo 2/12, in setting of sepsis. ST depressions on lateral leads, repeat EKG 2/13 nl. HS-Troponin 68 > 142 - Appreciate cardiology consulting. May need ischemic evaluation once stable.   Chronic Anemia - Stable  Thrombocytopenia In setting of sepsis, starting to improve -Trend with CBC  Best Practice (right click and "Reselect all SmartList Selections" daily)   Diet/type: NPO DVT prophylaxis: systemic heparin GI prophylaxis: N/A Lines: N/A Foley:  Yes, and it is still needed Code Status:  full code Last date of multidisciplinary  goals of [facility]are discussion [x]

## 2021-10-26 NOTE — Progress Notes (Addendum)
Attending physician's note   I have taken a history, reviewed the chart, and examined the patient. I performed a substantive portion of this encounter, including complete performance of at least one of the key components, in conjunction with the APP. I agree with the APP's note, impression, and recommendations with my edits.   Liver enzymes downtrending today.  Mental status improving compared with yesterday.  GI service will sign off at this time.  Please do not hesitate to contact us with additional questions or concerns  Gerrit Heck, DO, FACG [phone] office                Daily Rounding Note  10/26/2021, 12:29 PM  LOS: 3 days   SUBJECTIVE:   Chief complaint: Choledocholithiasis.  Ascending cholangitis.  Patient continues to have significant lethargy but is hemodynamically stable.  No BMs.  No complaint of abdominal pain.  OBJECTIVE:         Vital signs in last 24 hours:    Temp:  [98.1 F (36.7 C)-99.3 F (37.4 C)] 99.2 F (37.3 C) (02/15 1200) Pulse Rate:  [69-111] 79 (02/15 0630) Resp:  [18-30] 20 (02/15 0630) BP: (124-157)/(56-95) 146/76 (02/15 0600) SpO2:  [93 %-100 %] 100 % (02/15 0630) Weight:  [119.2 kg] 119.2 kg (02/15 0408) Last BM Date :  (pta) Filed Weights   10/23/21 0340 10/25/21 0414 10/26/21 0408  Weight: 104.3 kg 118.2 kg 119.2 kg   General: Awakens with minimal stimuli but lethargic. Mouth: Dry oral mucosa with collection of dried islands of debris and mid to posterior aspect of tongue Heart: Currently in sinus rhythm rates 70s to 80. Chest: No labored breathing.  No cough Abdomen: Soft.  Not tender.  Not distended.  Bowel sounds scant but normal quality. Extremities: No CCE Neuro/Psych: Oriented to self and place.  Drifts back and forth between lethargy, nonverbal and being present.  Follows simple commands.  Intake/Output from previous day: 02/14 0701 - 02/15 0700 In: 1761.5  [I.V.:1290; IV Piggyback:471.5] Out: 1700 [Urine:1700]  Intake/Output this shift: Total I/O In: -  Out: 300 [Urine:300]  Lab Results: Recent Labs    10/24/21 0411 10/24/21 2226 10/25/21 0029 10/26/21 0112  WBC 11.4*  --  9.3 12.0*  HGB 9.4* 10.9* 11.1* 10.2*  HCT 30.2* 32.0* 33.7* 31.2*  PLT 75*  --  63* 72*   BMET Recent Labs    10/24/21 0411 10/24/21 2226 10/25/21 0029 10/26/21 0112  NA 138 137 140 141  K 3.7 4.2 4.4 3.6  CL 99  --  102 106  CO2 23  --  21* 19*  GLUCOSE 374*  --  96 114*  BUN 37*  --  57* 71*  CREATININE 3.19*  --  4.42* 3.65*  CALCIUM 7.1*  --  8.4* 7.8*   LFT Recent Labs    10/24/21 0411 10/25/21 0029 10/26/21 0112  PROT 4.8* 5.6* 5.5*  ALBUMIN 2.3* 2.6* 2.7*  AST 1,557* 1,104* 357*  ALT 849* 905* 515*  ALKPHOS 106 150* 141*  BILITOT 3.7* 3.5* 1.9*   PT/INR No results for input(s): LABPROT, INR in the last 72 hours. Hepatitis Panel Recent Labs    10/25/21 0029  HEPBSAG NON REACTIVE  HCVAB NON REACTIVE  HEPAIGM NON REACTIVE  HEPBIGM NON REACTIVE    Studies/Results: No results found.  Scheduled Meds:  chlorhexidine  15 mL Mouth Rinse BID   Chlorhexidine Gluconate Cloth  6 each Topical Q0600   insulin aspart  0-15 Units Subcutaneous  Q4H   lactulose  30 g Oral Daily   mouth rinse  15 mL Mouth Rinse q12n4p   tamsulosin  0.4 mg Oral Daily   Continuous Infusions:  sodium chloride Stopped (10/25/21 2257)   albumin human 25 g (10/26/21 1037)   amiodarone 30 mg/hr (10/26/21 0600)   ampicillin-sulbactam (UNASYN) IV 3 g (10/26/21 1006)   heparin 2,000 Units/hr (10/26/21 1212)   PRN Meds:.docusate sodium, ondansetron (ZOFRAN) IV, polyethylene glycol   ASSESMENT:   Ascending cholangitis w sepsis.  Choledocholithiasis.  10/23/2021 ERCP, sphincterotomy, balloon sweep, removal stones/sludge/pus.  Blood clx growing strep infantarius, ID managing w Unasyn.  Will eventually need colonoscopy to r/O colon cancer a/w this organism.   06/2019 colonoscopy: 6 TA polyps.  LFTs improving w lagging ALK phos.   Afib, RVR.  Amiodarone and heparin drips in place    AMS, delirium in setting of sepsis.  Limits ability to take po safely.  Unremarkable for age head CT 2/12.    AKI.  Oliguria.  Labs improving.  Renal treating with IV albumin.   PLAN     If lethargy and diminished po intake continue, consider cortrak placement/enteral feeding, this is preferable to TPN.  Continue to offer feedings as tolerated and safe.      Azucena Freed  10/26/2021, 12:29 PM Phone [phone]

## 2021-10-26 NOTE — Progress Notes (Signed)
ANTICOAGULATION CONSULT NOTE   Pharmacy Consult for Heparin Indication: atrial fibrillation  No Known Allergies  Patient Measurements: Height: 5\' 11"  (180.3 cm) Weight: 119.2 kg (262 lb 12.6 oz) IBW/kg (Calculated) : 75.3 Heparin Dosing Weight: 100 kg  Vital Signs: Temp: 99.2 F (37.3 C) (02/14 1900) Temp Source: Axillary (02/14 1900) BP: 151/72 (02/15 0300) Pulse Rate: 86 (02/15 0400)  Labs: Recent Labs    10/23/21 0559 10/23/21 0933 10/24/21 0411 10/24/21 2226 10/25/21 0029 10/25/21 1448 10/26/21 0112  HGB  --    < > 9.4* 10.9* 11.1*  --  10.2*  HCT  --    < > 30.2* 32.0* 33.7*  --  31.2*  PLT  --    < > 75*  --  63*  --  72*  HEPARINUNFRC  --   --   --   --   --  0.17* <0.10*  CREATININE  --    < > 3.19*  --  4.42*  --  3.65*  TROPONINIHS 142*  --   --   --   --   --   --    < > = values in this interval not displayed.     Estimated Creatinine Clearance: 21.6 mL/min (A) (by C-G formula based on SCr of 3.65 mg/dL (H)).   Medical History: Past Medical History:  Diagnosis Date   Arthritis    Cataract    lens implant bilateral   Chronic kidney disease    kidney stones   Coronary artery disease    Dyspnea    Dysrhythmia    GERD (gastroesophageal reflux disease)    History of cholelithiasis    History of kidney stones    ca ox [name] @ [facility]) now [employer]   History of pneumonia    HLD (hyperlipidemia)    HTN (hypertension)    Jaundice    age 59   Pneumonia    years ago    T2DM (type 2 diabetes mellitus) ([place]) 2010    Medications:  Scheduled:   chlorhexidine  15 mL Mouth Rinse BID   Chlorhexidine Gluconate Cloth  6 each Topical Q0600   insulin aspart  0-15 Units Subcutaneous Q4H   mouth rinse  15 mL Mouth Rinse q12n4p   tamsulosin  0.4 mg Oral Daily    Assessment: 81 y.o. male admitted with cholangitis/sepsis, now with Afib, for heparin  Heparin level returned below goal at 0.17. Per RN no issues during infusion and no signs or symptoms  of bleeding.   2/15 AM update:  Heparin level low Plts 72, but up a little from yesterday  Goal of Therapy:  Heparin level 0.3-0.7 units/ml Monitor platelets by anticoagulation protocol: Yes   Plan:  Increase heparin infusion to 2000 units/hr Check heparin level in 8 hours Monitor CBC and sign/symptoms of bleeding  Narda Bonds, PharmD, BCPS Clinical Pharmacist Phone: [phone]

## 2021-10-26 NOTE — Progress Notes (Signed)
S: [name] Young is more awake this morning, denies complaints other than wanting to drink water.  O: BP (!) 146/76    Pulse 79    Temp 98.1 F (36.7 C) (Axillary)    Resp 20    Ht 5\' 11"  (1.803 m)    Wt 119.2 kg    SpO2 100%    BMI 36.65 kg/m  Chronically ill appearing man Sleeping, but arouses to stimulation, falls back asleep quickly. Answering questions appropriately, speaking in full sentences.  S1S2, irreg rhythm, reg rate No wheezing or rhales, mild tachypnea Abd obese, soft, NT Foley with light yellow urine Skin warm, dry, no rashes   Blood cultures> NG Innitial blood cultures> Strep, E coli  BUN  71 Cr  3.65 AST 357 ALT 515 T bili 1.9  Assessment & plan: Acute metabolic encephalopathy due to sepsis -avoid additional benzos -PT, OT -verbally redirect -can have diet if he stays awake, was able to eat 2 days ago -when more awake can transfer out of ICU, but has had prolonged sedation after ativan for agitation  Sepsis due to bacteremia, cholangitis -repeat blood cultures pending -appreciate ID's input -TEE Friday per Cardiology -con't unasyn -needs surgery evaluation eventually for elective cholecystectomy  AKI, improving -appreciate Nephrology's input -con't fluids until eating and drinking again -monitor -renally dose meds, avoid nephrotoxic meds -con't foley for strict I/O  Acute liver injury- improving, due to cholangitis -monitor -avoid hepatically metabolized meds where abel -check ammonia level again -lactulose daily  Acute HFrEF, suspect due to sepsis, but has h/o CAD and had EKG changes and trop leak at admission Afib with RVR Frequent PVCs -appreciate cardiology -back on amiodarone, con't to monitor LFTs on this -tele monitoring -monitor and optimize electrolytes -heparin gtt  Likely BPH -tamsulosin -needs to follow up with his urologist about his chronic urinary frequency symptoms  Julian Hy, DO 10/26/21 7:45 AM [facility]

## 2021-10-26 NOTE — Progress Notes (Signed)
[facility] for Infectious Disease  Date of Admission:  10/23/2021           Reason for visit: Follow up on cholangitis/bacteremia  Current antibiotics: Unasyn 2/14 -- present   Previous antibiotics: Pip Tazo 2/12 x 1 dose Ceftriaxone 2/12 -- 2/14 Metronidazole 2/12 -- 2/14  ASSESSMENT:    81 y.o. male admitted with:  Strep infantarius and E coli bacteremia: Secondary to cholangitis status post ERCP with gastroenterology with removal of gallstones. Severe sepsis: Secondary to #1 and improved. Acute kidney injury: Seen by nephrology and creatinine improved today. Elevated LFTs, T. bili: Secondary to 1 and improved.  Hepatitis panel was negative. Encephalopathy: Improving  RECOMMENDATIONS:    Continue Unasyn dosed for kidney function TEE planned for Friday to rule out endocarditis.  Appreciate cardiology assistance Follow-up repeat blood cultures Will need evaluation for colonic neoplasia when more stabilized although current bacteremia is secondary to cholangitis Lab monitoring Will follow   Principal Problem:   Bacteremia due to Streptococcus Active Problems:   Coronary artery disease   Paroxysmal A-fib (HCC)   Sepsis (HCC)   Calculus of bile duct with cholangitis and obstruction   Jaundice   Elevated LFTs   Hyperbilirubinemia   Bacteremia due to Escherichia coli    MEDICATIONS:    Scheduled Meds:  chlorhexidine  15 mL Mouth Rinse BID   Chlorhexidine Gluconate Cloth  6 each Topical Q0600   insulin aspart  0-15 Units Subcutaneous Q4H   lactulose  30 g Oral Daily   mouth rinse  15 mL Mouth Rinse q12n4p   tamsulosin  0.4 mg Oral Daily   Continuous Infusions:  sodium chloride Stopped (10/25/21 2257)   albumin human     amiodarone 30 mg/hr (10/26/21 0600)   ampicillin-sulbactam (UNASYN) IV 3 g (10/26/21 1006)   heparin 2,000 Units/hr (10/26/21 0600)   PRN Meds:.docusate sodium, ondansetron (ZOFRAN) IV, polyethylene glycol  SUBJECTIVE:   24  hour events:  No acute events noted overnight Afebrile, Tmax 99.3 Creatinine improved today LFTs also improved with T. bili  Repeat blood cultures no growth to date No new imaging   Patient seen at the bedside today along with his wife.  He is getting ready to work with physical therapy.  He appears little bit more awake this morning.  He opens his eyes and follows conversation.  Planning for TEE on Friday.   Review of Systems  Unable to perform ROS: Mental acuity     OBJECTIVE:   Blood pressure (!) 146/76, pulse 79, temperature 98.9 F (37.2 C), temperature source Axillary, resp. rate 20, height 5\' 11"  (1.803 m), weight 119.2 kg, SpO2 100 %. Body mass index is 36.65 kg/m.  Physical Exam Constitutional:      General: He is not in acute distress.    Comments: Sleeping but arouses to conversation.  Falls back asleep.   HENT:     Head: Normocephalic and atraumatic.  Eyes:     Extraocular Movements: Extraocular movements intact.     Conjunctiva/sclera: Conjunctivae normal.  Pulmonary:     Effort: Pulmonary effort is normal. No respiratory distress.  Abdominal:     General: There is no distension.     Palpations: Abdomen is soft.  Musculoskeletal:     Cervical back: Normal range of motion and neck supple.  Skin:    General: Skin is warm and dry.     Findings: No rash.  Neurological:     General: No focal deficit present.  Mental Status: He is alert.     Lab Results: Lab Results  Component Value Date   WBC 12.0 (H) 10/26/2021   HGB 10.2 (L) 10/26/2021   HCT 31.2 (L) 10/26/2021   MCV 88.9 10/26/2021   PLT 72 (L) 10/26/2021    Lab Results  Component Value Date   NA 141 10/26/2021   K 3.6 10/26/2021   CO2 19 (L) 10/26/2021   GLUCOSE 114 (H) 10/26/2021   BUN 71 (H) 10/26/2021   CREATININE 3.65 (H) 10/26/2021   CALCIUM 7.8 (L) 10/26/2021   GFRNONAA 16 (L) 10/26/2021   GFRAA 55 (L) 09/29/2020    Lab Results  Component Value Date   ALT 515 (H) 10/26/2021    AST 357 (H) 10/26/2021   ALKPHOS 141 (H) 10/26/2021   BILITOT 1.9 (H) 10/26/2021    No results found for: CRP  No results found for: ESRSEDRATE   I have reviewed the micro and lab results in Epic.  Imaging: No results found.   Imaging independently reviewed in Epic.    Raynelle Highland for Infectious Disease [facility] [phone] pager 10/26/2021, 10:22 AM

## 2021-10-26 NOTE — TOC Initial Note (Signed)
Transition of Care (TOC) - Initial/Assessment Note    Patient Details  Name: [name] Young. MRN: [id number] Date of Birth: 10/09/1940  Transition of Care [facility]) CM/SW Contact:    Joanne Chars, LCSW Phone Number: 10/26/2021, 4:02 PM  Clinical Narrative: CSW spoke briefly with pt, who was able to engage, asking for water, permission given to speak with wife and daughter.  CSW spoke with wife Barnetta Chapel by phone, discussed current SNF recommendation.  Wife is hoping that pt makes improvements prior to DC so that he can come straight home and avoid SNF.  If pt not able to walk, she would be willing to consider SNF at that time.  Pt is vaccinated for covid with 2 boosters.  TOC will continue to follow.                    Expected Discharge Plan:  (TBD) Barriers to Discharge: Continued Medical Work up   Patient Goals and CMS Choice        Expected Discharge Plan and Services Expected Discharge Plan:  (TBD) In-house Referral: Clinical Social Work   Post Acute Care Choice:  (TBD) Living arrangements for the past 2 months: Single Family Home                                      Prior Living Arrangements/Services Living arrangements for the past 2 months: Single Family Home Lives with:: Spouse          Need for Family Participation in Patient Care: Yes (Comment) Care giver support system in place?: Yes (comment) Current home services: Other (comment) (none) Criminal Activity/Legal Involvement Pertinent to Current Situation/Hospitalization: No - Comment as needed  Activities of Daily Living Home Assistive Devices/Equipment: None ADL Screening (condition at time of admission) Patient's cognitive ability adequate to safely complete daily activities?: No Is the patient deaf or have difficulty hearing?: Yes Does the patient have difficulty seeing, even when wearing glasses/contacts?: No Does the patient have difficulty concentrating, remembering, or making  decisions?: Yes Patient able to express need for assistance with ADLs?: Yes Does the patient have difficulty dressing or bathing?: Yes Independently performs ADLs?: No Communication: Independent Dressing (OT): Needs assistance Is this a change from baseline?: Change from baseline, expected to last <3days Grooming: Needs assistance Is this a change from baseline?: Change from baseline, expected to last <3 days Feeding: Independent Bathing: Needs assistance Is this a change from baseline?: Change from baseline, expected to last <3 days Toileting: Needs assistance Is this a change from baseline?: Change from baseline, expected to last <3 days In/Out Bed: Needs assistance Is this a change from baseline?: Change from baseline, expected to last <3 days Walks in Home: Dependent Is this a change from baseline?: Change from baseline, expected to last <3 days Does the patient have difficulty walking or climbing stairs?: Yes Weakness of Legs: Both Weakness of Arms/Hands: Both  Permission Sought/Granted Permission sought to share information with : Family Supports Permission granted to share information with : Yes, Verbal Permission Granted  Share Information with NAME: wife Barnetta Chapel, daughter Anne Ng           Emotional Assessment Appearance:: Appears stated age Attitude/Demeanor/Rapport: Lethargic Affect (typically observed): Pleasant Orientation: : Oriented to Self, Oriented to Place, Oriented to  Time Alcohol / Substance Use: Not Applicable Psych Involvement: No (comment)  Admission diagnosis:  Hypomagnesemia [E83.42] Hyperbilirubinemia [E80.6] Pain [R52]  Transaminitis [R74.01] NSTEMI (non-ST elevated myocardial infarction) ([place]) [I21.4] AKI (acute kidney injury) ([place]) [N17.9] NSVT (nonsustained ventricular tachycardia) [I47.29] Sepsis ([place]) [A41.9] Sepsis with acute organ dysfunction, due to unspecified organism, unspecified type, unspecified whether septic shock present ([place])  [A41.9, R65.20] Patient Active Problem List   Diagnosis Date Noted   Bacteremia due to Escherichia coli 10/24/2021   Bacteremia due to Streptococcus 10/24/2021   Hyperbilirubinemia    Sepsis ([place]) 10/23/2021   Calculus of bile duct with cholangitis and obstruction    Jaundice    Elevated LFTs    Vitamin B12 deficiency 05/24/2020   Vitamin D deficiency 05/24/2020   Early dry stage nonexudative age-related macular degeneration of both eyes 12/01/2019   Pain due to onychomycosis of toenails of both feet 07/28/2019   Diabetic neuropathy ([place]) 07/28/2019   Hearing loss, right 05/22/2019   Chronic heart failure with preserved ejection fraction ([place]) 05/24/2018   Paroxysmal A-fib ([place]) 02/08/2018   Debility 01/28/2018   Anemia    S/P CABG x 3 01/18/2018   Coronary artery disease 12/29/2017   Chronic inactive rheumatic heart disease 11/24/2017   DNR (do not resuscitate) 06/14/2017   Peripheral neuropathy 06/14/2017   Tinea cruris 04/02/2017   Health maintenance examination 12/12/2016   Severe obesity (BMI 35.0-39.9) with comorbidity ([place]) 12/12/2016   Advanced care planning/counseling discussion 10/30/2014   Benign prostatic hyperplasia 10/30/2014   CKD stage 3 due to type 2 diabetes mellitus ([place]) 07/08/2013   Tinea corporis 01/16/2013   Medicare annual wellness visit, subsequent 10/11/2011   Controlled diabetes mellitus type 2 with complications ([place])    Essential hypertension    Hyperlipidemia associated with type 2 diabetes mellitus ([place])    History of kidney stones    GERD (gastroesophageal reflux disease)    PCP:  Ria Bush, MD Pharmacy:   [facility] [address] [place] [address] Phone: [phone] Fax: [phone]     Social Determinants of Health (SDOH) Interventions    Readmission Risk Interventions No flowsheet data found.

## 2021-10-26 NOTE — Evaluation (Signed)
Occupational Therapy Evaluation Patient Details Name: [name] Young. MRN: [id number] DOB: 01-08-1941 Today's Date: 10/26/2021   History of Present Illness 81 y.o. male presented to the ED 10/23/21 with complaints of abdominal pain with associated nausea and vomiting that began the day before. In ED pt found to be in A-fib RVR, hypertensive, and tachypnea. CTA chest/abd/pelvis reveled dilated bile duct, acute cholelithiasis without cholecystis found to have cholangitis status post ERCP with gastroenterology on 10/23/2021.  His blood cultures on admission are positive for E. coli and Streptococcus species.  PMH significant for CAD s/p CAGB 2019 A-fib,, HTN. HLD, diabetes, GERD, and CKD   Clinical Impression   PTA, pt lives with spouse and typically Modified Independent with ADLs/mobility using cane. Pt presents now with deficits in cognition, strength, and endurance. Pt remains significantly limited by lethargy (from Ativan 2 days ago per staff), awakens easily to voice and responds appropriately though quickly falls back to sleep. Even with initiation of bed mobility, pt unable to sustain alertness long enough to assist in sitting EOB. Pt able to complete simple grooming tasks today though requires Mod A for UB ADLs, Total A x 2 for LB ADLs and significant assist for any OOB attempts. Recommend SNF rehab at discharge as pt significantly below functional baseline.   VSS on RA      Recommendations for follow up therapy are one component of a multi-disciplinary discharge planning process, led by the attending physician.  Recommendations may be updated based on patient status, additional functional criteria and insurance authorization.   Follow Up Recommendations  Skilled nursing-short term rehab (<3 hours/day)    Assistance Recommended at Discharge Frequent or constant Supervision/Assistance  Patient can return home with the following Two people to help with walking and/or transfers;Two people  to help with bathing/dressing/bathroom    Functional Status Assessment  Patient has had a recent decline in their functional status and demonstrates the ability to make significant improvements in function in a reasonable and predictable amount of time.  Equipment Recommendations  Hospital bed    Recommendations for Other Services       Precautions / Restrictions Precautions Precautions: Fall Restrictions Weight Bearing Restrictions: No      Mobility Bed Mobility Overal bed mobility: Needs Assistance             General bed mobility comments: Guided pt in long sitting by pulling on B bedrails with max A to lift trunk from bed. Initiated bed mobility to sit EOB with tactile cues and pt initially assisting LEs towards EOB but quickly fell asleep and unable to sustain    Transfers                   General transfer comment: unable due to fatigue and weakness      Balance                                           ADL either performed or assessed with clinical judgement   ADL Overall ADL's : Needs assistance/impaired     Grooming: Set up;Bed level;Wash/dry face Grooming Details (indicate cue type and reason): and use of suction device Upper Body Bathing: Moderate assistance;Bed level   Lower Body Bathing: Total assistance;+2 for physical assistance;+2 for safety/equipment;Bed level   Upper Body Dressing : Moderate assistance;Bed level   Lower Body Dressing: Total assistance;+2 for physical assistance;+2  for safety/equipment;Bed level                 General ADL Comments: Limited by quick lethargy and noted slower processing and weakness requiring extensive assist for ADLs     Vision Ability to See in Adequate Light: 1 Impaired Patient Visual Report: No change from baseline Vision Assessment?: No apparent visual deficits     Perception     Praxis      Pertinent Vitals/Pain Pain Assessment Pain Assessment: Faces Faces Pain  Scale: Hurts little more Pain Location: urinary catheter Pain Descriptors / Indicators: Sore Pain Intervention(s): Monitored during session, Limited activity within patient's tolerance     Hand Dominance Right   Extremity/Trunk Assessment Upper Extremity Assessment Upper Extremity Assessment: Generalized weakness   Lower Extremity Assessment Lower Extremity Assessment: Defer to PT evaluation       Communication Communication Communication: No difficulties   Cognition Arousal/Alertness: Lethargic, Awake/alert Behavior During Therapy: WFL for tasks assessed/performed Overall Cognitive Status: Impaired/Different from baseline Area of Impairment: Following commands, Memory, Safety/judgement, Awareness, Problem solving                     Memory: Decreased short-term memory Following Commands: Follows one step commands with increased time, Follows one step commands inconsistently Safety/Judgement: Decreased awareness of deficits, Decreased awareness of safety Awareness: Emergent Problem Solving: Slow processing, Decreased initiation, Difficulty sequencing, Requires verbal cues, Requires tactile cues General Comments: awakens very easily and conversive but unable to sustain alertness for effective participation. Inconsistent PLOF reports to PT and OT. Pt awakens self when coughing to use suction device     General Comments  VSS on RA    Exercises     Shoulder Instructions      Home Living Family/patient expects to be discharged to:: Private residence Living Arrangements: Spouse/significant other Available Help at Discharge: Family;Available 24 hours/day Type of Home: House Home Access: Stairs to enter [employer] of Steps: 3 Entrance Stairs-Rails: Left Home Layout: One level     Bathroom Shower/Tub: Teacher, early years/pre: Handicapped height Bathroom Accessibility: No   Home Equipment: Conservation officer, nature (2 wheels);Tub bench;Hand held  shower head;Cane - single point   Additional Comments: Noted tub bench listed from chart review though pt denied having shower chair at home with OT eval      Prior Functioning/Environment Prior Level of Function : Needs assist;Patient poor historian/Family not available       Physical Assist : ADLs (physical)   ADLs (physical): IADLs Mobility Comments: limited community ambulation with cane ADLs Comments: independent with bADLs, wife provides for iADLs. 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        OT Problem List: Decreased strength;Decreased activity tolerance;Impaired balance (sitting and/or standing);Decreased cognition;Decreased safety awareness;Decreased coordination;Cardiopulmonary status limiting activity      OT Treatment/Interventions: Self-care/ADL training;Therapeutic exercise;Energy conservation;DME and/or AE instruction;Therapeutic activities;Patient/family education;Balance training    OT Goals(Current goals can be found in the care plan section) Acute Rehab OT Goals Patient Stated Goal: get something to eat OT Goal Formulation: With patient Time For Goal Achievement: 11/09/21 Potential to Achieve Goals: Good  OT Frequency: Min 2X/week    Co-evaluation              AM-PAC OT "6 Clicks" Daily Activity     Outcome Measure Help from another person eating meals?: Total (NPO) Help from another person taking care of personal grooming?: A Little Help from another person toileting, which includes using  toliet, bedpan, or urinal?: Total Help from another person bathing (including washing, rinsing, drying)?: A Lot Help from another person to put on and taking off regular upper body clothing?: A Lot Help from another person to put on and taking off regular lower body clothing?: Total 6 Click Score: 10   End of Session Nurse Communication: Mobility status  Activity Tolerance: Patient limited by lethargy;Patient limited by fatigue Patient  left: in bed;with call bell/phone within reach;with bed alarm set  OT Visit Diagnosis: Unsteadiness on feet (R26.81);Other abnormalities of gait and mobility (R26.89);Muscle weakness (generalized) (M62.81)                Time: 9861-4830 OT Time Calculation (min): 15 min Charges:  OT General Charges $OT Visit: 1 Visit OT Evaluation $OT Eval Moderate Complexity: 1 Mod  Malachy Chamber, OTR/L Acute Rehab Services Office: [phone]   Layla Maw 10/26/2021, 2:46 PM

## 2021-10-26 NOTE — Progress Notes (Signed)
Progress Note  Patient Name: [name] Young. Date of Encounter: 10/26/2021  Primary Cardiologist: Nelva Bush, MD   Subjective   Patient see and examined at his bedside.  He was awake and alert at the time of my encounter.  His wife was by the bedside.  Inpatient Medications    Scheduled Meds:  chlorhexidine  15 mL Mouth Rinse BID   Chlorhexidine Gluconate Cloth  6 each Topical Q0600   insulin aspart  0-15 Units Subcutaneous Q4H   lactulose  30 g Oral Daily   mouth rinse  15 mL Mouth Rinse q12n4p   tamsulosin  0.4 mg Oral Daily   Continuous Infusions:  sodium chloride Stopped (10/25/21 2257)   albumin human     amiodarone 30 mg/hr (10/26/21 0600)   ampicillin-sulbactam (UNASYN) IV 3 g (10/26/21 1006)   heparin 2,000 Units/hr (10/26/21 0600)   PRN Meds: docusate sodium, ondansetron (ZOFRAN) IV, polyethylene glycol   Vital Signs    Vitals:   10/26/21 0500 10/26/21 0600 10/26/21 0630 10/26/21 0800  BP: (!) 142/71 (!) 146/76    Pulse: 84 81 79   Resp: (!) 23 (!) 23 20   Temp:    98.9 F (37.2 C)  TempSrc:    Axillary  SpO2: 99% 99% 100%   Weight:      Height:        Intake/Output Summary (Last 24 hours) at 10/26/2021 1020 Last data filed at 10/26/2021 0600 Gross per 24 hour  Intake 1723.83 ml  Output 1285 ml  Net 438.83 ml   Filed Weights   10/23/21 0340 10/25/21 0414 10/26/21 0408  Weight: 104.3 kg 118.2 kg 119.2 kg    Telemetry    Atrial fibrillation with ventricular rate- Personally Reviewed  ECG    Patient fibrillation with rapid ventricular rate- Personally Reviewed  Physical Exam    General: Not in acute distress,  Head: Atraumatic, normal size  Eyes: PEERLA, EOMI  Neck: Supple, normal JVD Cardiac: Normal S1, S2; RRR; no murmurs, rubs, or gallops Lungs: Clear to auscultation bilaterally Abd: Soft, nontender, no hepatomegaly  Ext: warm, no edema Musculoskeletal: No deformities, BUE and BLE strength normal and equal Skin: Warm and  dry, no rashes   Neuro: Awake alert and oriented x3  Labs    Chemistry Recent Labs  Lab 10/24/21 0411 10/24/21 2226 10/25/21 0029 10/26/21 0112  NA 138 137 140 141  K 3.7 4.2 4.4 3.6  CL 99  --  102 106  CO2 23  --  21* 19*  GLUCOSE 374*  --  96 114*  BUN 37*  --  57* 71*  CREATININE 3.19*  --  4.42* 3.65*  CALCIUM 7.1*  --  8.4* 7.8*  PROT 4.8*  --  5.6* 5.5*  ALBUMIN 2.3*  --  2.6* 2.7*  AST 1,557*  --  1,104* 357*  ALT 849*  --  905* 515*  ALKPHOS 106  --  150* 141*  BILITOT 3.7*  --  3.5* 1.9*  GFRNONAA 19*  --  13* 16*  ANIONGAP 16*  --  17* 16*     Hematology Recent Labs  Lab 10/24/21 0411 10/24/21 2226 10/25/21 0029 10/26/21 0112  WBC 11.4*  --  9.3 12.0*  RBC 3.27*  --  3.78* 3.51*  HGB 9.4* 10.9* 11.1* 10.2*  HCT 30.2* 32.0* 33.7* 31.2*  MCV 92.4  --  89.2 88.9  MCH 28.7  --  29.4 29.1  MCHC 31.1  --  32.9  32.7  RDW 15.6*  --  15.8* 15.9*  PLT 75*  --  63* 72*    Cardiac EnzymesNo results for input(s): TROPONINI in the last 168 hours. No results for input(s): TROPIPOC in the last 168 hours.   BNPNo results for input(s): BNP, PROBNP in the last 168 hours.   DDimer No results for input(s): DDIMER in the last 168 hours.   Radiology    No results found.  Cardiac Studies   TTE 10/23/2021 IMPRESSIONS     1. Left ventricular ejection fraction, by estimation, is 40 to 45%. The  left ventricle has mildly decreased function. The left ventricle  demonstrates global hypokinesis. Left ventricular diastolic parameters are  consistent with Grade II diastolic  dysfunction (pseudonormalization). Elevated left ventricular end-diastolic  pressure.   2. Right ventricular systolic function is normal. The right ventricular  size is normal. There is normal pulmonary artery systolic pressure. The  estimated right ventricular systolic pressure is 42.5 mmHg.   3. Left atrial size was mildly dilated.   4. The mitral valve is degenerative. Trivial mitral valve  regurgitation.  No evidence of mitral stenosis.   5. The aortic valve is calcified. There is moderate calcification of the  aortic valve. There is moderate thickening of the aortic valve. Aortic  valve regurgitation is not visualized. Aortic valve  sclerosis/calcification is present, without any evidence  of aortic stenosis.   6. There is mild dilatation of the aortic root, measuring 41 mm. There is  mild dilatation of the ascending aorta, measuring 38 mm.   7. The inferior vena cava is dilated in size with >50% respiratory  variability, suggesting right atrial pressure of 8 mmHg.   FINDINGS   Left Ventricle: Left ventricular ejection fraction, by estimation, is 40  to 45%. The left ventricle has mildly decreased function. The left  ventricle demonstrates global hypokinesis. The left ventricular internal  cavity size was normal in size. There is   no left ventricular hypertrophy. Left ventricular diastolic parameters  are consistent with Grade II diastolic dysfunction (pseudonormalization).  Elevated left ventricular end-diastolic pressure.   Right Ventricle: The right ventricular size is normal. No increase in  right ventricular wall thickness. Right ventricular systolic function is  normal. There is normal pulmonary artery systolic pressure. The tricuspid  regurgitant velocity is 2.02 m/s, and   with an assumed right atrial pressure of 8 mmHg, the estimated right  ventricular systolic pressure is 95.6 mmHg.   Left Atrium: Left atrial size was mildly dilated.   Right Atrium: Right atrial size was normal in size.   Pericardium: There is no evidence of pericardial effusion.   Mitral Valve: The mitral valve is degenerative in appearance. There is  moderate thickening of the mitral valve leaflet(s). There is moderate  calcification of the mitral valve leaflet(s). Mild mitral annular  calcification. Trivial mitral valve  regurgitation. No evidence of mitral valve stenosis.    Tricuspid Valve: The tricuspid valve is normal in structure. Tricuspid  valve regurgitation is trivial. No evidence of tricuspid stenosis.   Aortic Valve: The aortic valve is calcified. There is moderate  calcification of the aortic valve. There is moderate thickening of the  aortic valve. Aortic valve regurgitation is not visualized. Aortic valve  sclerosis/calcification is present, without any   evidence of aortic stenosis.   Pulmonic Valve: The pulmonic valve was normal in structure. Pulmonic valve  regurgitation is trivial. No evidence of pulmonic stenosis.   Aorta: The aortic root is normal in  size and structure. There is mild  dilatation of the aortic root, measuring 41 mm. There is mild dilatation  of the ascending aorta, measuring 38 mm.   Venous: The inferior vena cava is dilated in size with greater than 50%  respiratory variability, suggesting right atrial pressure of 8 mmHg.   IAS/Shunts: No atrial level shunt detected by color flow Doppler.       Patient Profile     81 y.o. male hx of 3v CAD with CABG 2019, with post op a fib, HTN, HLD, DM-2, CKD 3, now admitted with abd pain due to biliary tract obstruction and ascending cholangitis undergoing ERCP removing 2 stones, sepsis and a fib who is being seen 10/24/2021 for the evaluation of atrial fib and new CM with EF 40-45%.  Assessment & Plan   Atrial fibrillation rapid ventricular rate-rate controlled but with frequent PVCs.  Has been started back on amiodarone in the setting of his transaminitis would favor titrating off amiodarone and increasing beta-blocker.  Though transaminitis was in the setting of sepsis concern with significantly elevated liver enzymes slightly higher risk for amiodarone induced liver toxicity. Agree with heparin drip.  CAD-slightly elevated troponin in the setting of his infection.  Will reevaluate for need of ischemic evaluation once acute issues have resolved.  For now continue with medical  therapy aspirin 81 mg daily, Crestor.  Depressed ejection fraction-does not appear to be volume overloaded.  Cautious diuretic as clinically indicated.  Sepsis secondary to E. coli and strep to coccus species bacteremia in the setting of acute ascending cholangitis now status post ERCP-ID following now on Unasyn.   Plan for TEE on Friday.  Shared Decision Making/Informed Consent The risks [esophageal damage, perforation (1:10,000 risk), bleeding, pharyngeal hematoma as well as other potential complications associated with conscious sedation including aspiration, arrhythmia, respiratory failure and death], benefits (treatment guidance and diagnostic support) and alternatives of a transesophageal echocardiogram were discussed in detail with Mr. Rosana Berger and he is willing to proceed.    Acute metabolic encephalopathy- resolved   Type 2 diabetes AKI Chronic anemia  All managed by primary ICU team.  CRITICAL CARE Performed by: Berniece Salines  Total critical care time: 30 minutes. Critical care time was exclusive of separately billable procedures and treating other patients. Critical care was necessary to treat or prevent imminent or life-threatening deterioration. Critical care was time spent personally by me on the following activities: development of treatment plan with patient and/or surrogate as well as nursing, discussions with consultants, evaluation of patient's response to treatment, examination of patient, obtaining history from patient or surrogate, ordering and performing treatments and interventions, ordering and review of laboratory studies, ordering and review of radiographic studies, pulse oximetry and re-evaluation of patient's condition.   Signed, Berniece Salines, DO [place]  10/26/2021 10:20 AM     For questions or updates, please contact [place] Please consult [email or web address] for contact info under Cardiology/STEMI.

## 2021-10-26 NOTE — Progress Notes (Signed)
[facility] NEPHROLOGY PROGRESS NOTE  Assessment/ Plan: Pt is a 81 y.o. yo male  with history of HTN, HLD, DM, CAD status post CABG, A-fib, CKD with baseline creatinine level seems to be around 1.3-1.6, with cholangitis, E. coli and streptococcal bacteremia consulted for AKI on CKD.  # Acute kidney injury on CKD stage III likely  ATN in the setting of sepsis/bacteremia concomitant with the use of IV contrast and liver disease.  UA is cloudy and CT scan ruled out obstruction.  He was n.p.o. and looking dehydrated therefore restarted IV fluid.  The urine output increased to 1.6 L and the creatinine level trending down. Continue gentle IV hydration until he can eat and drink well.  Order 2 doses of IV albumin..  Continue daily lab and strict ins and out.  Hopefully we can avoid dialysis.  I have discussed with ICU team.   #Sepsis due to E. coli and Streptococcus bacteremia: Currently on Unasyn per ID and plan for TEE noted.   #Ascending colitis status post ERCP and removal of stone and sphincterotomy: Seen by GI, trend liver enzyme.   #Acute metabolic encephalopathy probably due to Ativan, liver disease.  Recommend to avoid binge use or sedatives.  Mental status has improved  today.   #A-fib with RVR: On heparin and rate control per ICU team.  Subjective: Seen and examined in ICU.  The urine output is 1.6 L.  He is alert awake and following simple commands.  This is much better than before.  No other new event. Objective Vital signs in last 24 hours: Vitals:   10/26/21 0500 10/26/21 0600 10/26/21 0630 10/26/21 0800  BP: (!) 142/71 (!) 146/76    Pulse: 84 81 79   Resp: (!) 23 (!) 23 20   Temp:    98.9 F (37.2 C)  TempSrc:    Axillary  SpO2: 99% 99% 100%   Weight:      Height:       Weight change: 1 kg  Intake/Output Summary (Last 24 hours) at 10/26/2021 0833 Last data filed at 10/26/2021 0600 Gross per 24 hour  Intake 1761.54 ml  Output 1600 ml  Net 161.54 ml        Labs: Basic Metabolic Panel: Recent Labs  Lab 10/24/21 0411 10/24/21 2226 10/25/21 0029 10/26/21 0112  NA 138 137 140 141  K 3.7 4.2 4.4 3.6  CL 99  --  102 106  CO2 23  --  21* 19*  GLUCOSE 374*  --  96 114*  BUN 37*  --  57* 71*  CREATININE 3.19*  --  4.42* 3.65*  CALCIUM 7.1*  --  8.4* 7.8*  PHOS 5.8*  --   --   --    Liver Function Tests: Recent Labs  Lab 10/24/21 0411 10/25/21 0029 10/26/21 0112  AST 1,557* 1,104* 357*  ALT 849* 905* 515*  ALKPHOS 106 150* 141*  BILITOT 3.7* 3.5* 1.9*  PROT 4.8* 5.6* 5.5*  ALBUMIN 2.3* 2.6* 2.7*   Recent Labs  Lab 10/23/21 0334 10/24/21 0411  LIPASE 390* 109*   Recent Labs  Lab 10/23/21 0401  AMMONIA 57*   CBC: Recent Labs  Lab 10/23/21 0334 10/23/21 0431 10/23/21 0933 10/23/21 2154 10/24/21 0411 10/24/21 2226 10/25/21 0029 10/26/21 0112  WBC 7.9  --  14.8* 16.2* 11.4*  --  9.3 12.0*  NEUTROABS 7.4  --   --  14.4*  --   --  8.4*  --   HGB 12.9*   < >  12.0* 9.5* 9.4* 10.9* 11.1* 10.2*  HCT 40.6   < > 38.3* 30.6* 30.2* 32.0* 33.7* 31.2*  MCV 93.8  --  94.3 95.3 92.4  --  89.2 88.9  PLT 193  --  149* 96* 75*  --  63* 72*   < > = values in this interval not displayed.   Cardiac Enzymes: No results for input(s): CKTOTAL, CKMB, CKMBINDEX, TROPONINI in the last 168 hours. CBG: Recent Labs  Lab 10/25/21 1530 10/25/21 2004 10/25/21 2312 10/26/21 0332 10/26/21 0748  GLUCAP 107* 128* 120* 121* 116*    Iron Studies: No results for input(s): IRON, TIBC, TRANSFERRIN, FERRITIN in the last 72 hours. Studies/Results: No results found.  Medications: Infusions:  sodium chloride Stopped (10/25/21 2257)   amiodarone 30 mg/hr (10/26/21 0600)   ampicillin-sulbactam (UNASYN) IV Stopped (10/25/21 2327)   heparin 2,000 Units/hr (10/26/21 0600)    Scheduled Medications:  chlorhexidine  15 mL Mouth Rinse BID   Chlorhexidine Gluconate Cloth  6 each Topical Q0600   insulin aspart  0-15 Units Subcutaneous Q4H    lactulose  30 g Per Tube Daily   mouth rinse  15 mL Mouth Rinse q12n4p   tamsulosin  0.4 mg Oral Daily    have reviewed scheduled and prn medications.  Physical Exam: General:NAD, comfortable Heart:RRR, s1s2 nl Lungs:clear b/l, no crackle Abdomen:soft, Non-tender, non-distended Extremities:No edema Neurology: Alert, awake and following commands  Dayle Sherpa Tanna Furry 10/26/2021,8:33 AM  LOS: 3 days

## 2021-10-26 NOTE — Evaluation (Signed)
Physical Therapy Evaluation Patient Details Name: [name] Young. MRN: [id number] DOB: [birth year] Today's Date: 10/26/2021  History of Present Illness  81 y.o. male presented to the ED 10/23/21 with complaints of abdominal pain with associated nausea and vomiting that began the day before. In ED pt found to be in A-fib RVR, hypertensive, and tachypnea. CTA chest/abd/pelvis reveled dilated bile duct, acute cholelithiasis without cholecystis found to have cholangitis status post ERCP with gastroenterology on 10/23/2021.  His blood cultures on admission are positive for E. coli and Streptococcus species.  PMH significant for CAD s/p CAGB 2019 A-fib,, HTN. HLD, diabetes, GERD, and CKD  Clinical Impression  PTA pt living with wife in single story home with 3 steps to enter. Pt was able to ambulate limited community distances with his cane and was independent in bADLs, wife performs iADLs. Pt is currently limited in safe mobility by continued lethargy s/p Ativan earlier in the week as well as decreased cognition particularly decreased safety awareness in presence of generalized weakness and increased fatigue with mobility. Pt requires max A for rolling and total A for coming to sit EoB and get back to bed. Pt asleep within 3 min of return to bed. PT hopeful for increased mobility once lethargy clears however at this time PT recommending SNF level rehab at discharge. PT will continue to follow acutely.     Recommendations for follow up therapy are one component of a multi-disciplinary discharge planning process, led by the attending physician.  Recommendations may be updated based on patient status, additional functional criteria and insurance authorization.  Follow Up Recommendations Skilled nursing-short term rehab (<3 hours/day)    Assistance Recommended at Discharge Frequent or constant Supervision/Assistance  Patient can return home with the following  Two people to help with walking and/or  transfers;Two people to help with bathing/dressing/bathroom;Assistance with cooking/housework;Assistance with feeding;Direct supervision/assist for medications management;Direct supervision/assist for financial management;Assist for transportation;Help with stairs or ramp for entrance    Equipment Recommendations Other (comment);Hospital bed (hoyer lift,)     Functional Status Assessment Patient has had a recent decline in their functional status and demonstrates the ability to make significant improvements in function in a reasonable and predictable amount of time.     Precautions / Restrictions Precautions Precautions: Fall Restrictions Weight Bearing Restrictions: No      Mobility  Bed Mobility Overal bed mobility: Needs Assistance Bed Mobility: Rolling, Supine to Sit, Sit to Supine Rolling: Max assist   Supine to sit: Total assist, HOB elevated Sit to supine: Total assist   General bed mobility comments: max A for coming over onto R side for placement and removal of bedpan, able to follow commands for sequencing LE movement off bed,requiring total  pt with c/o pain at urinary catheter site with movement, able to reach across body to bring trunk to side with maxA and total A for bringing trunk to upright, pt able to static sit EoB for about 2 min with increasing fatigue, pt able to lay trunk down and requires total A for returning LE to bed    Transfers                   General transfer comment: unable due to fatigue and weakness        Balance Overall balance assessment: Needs assistance Sitting-balance support: Feet supported, Bilateral upper extremity supported Sitting balance-Leahy Scale: Poor Sitting balance - Comments: able to hold fully upright sitting balance for approximately 10 sec before requiring outside support  Postural control: Left lateral lean                                   Pertinent Vitals/Pain Pain Assessment Pain Assessment:  Faces Faces Pain Scale: Hurts little more Pain Location: urinary catheter Pain Descriptors / Indicators: Grimacing, Guarding, Moaning Pain Intervention(s): Limited activity within patient's tolerance, Monitored during session, Repositioned    Home Living Family/patient expects to be discharged to:: Private residence Living Arrangements: Spouse/significant other Available Help at Discharge: Family;Available 24 hours/day Type of Home: House Home Access: Stairs to enter Entrance Stairs-Rails: Left Entrance Stairs-Number of Steps: 3   Home Layout: One level Home Equipment: Conservation officer, nature (2 wheels);Tub bench;Hand held shower head;Cane - single point      Prior Function Prior Level of Function : Independent/Modified Independent             Mobility Comments: limited community ambulation with cane ADLs Comments: independent with bADLs, wife provides for iADLs     Hand Dominance   Dominant Hand: Right    Extremity/Trunk Assessment   Upper Extremity Assessment Upper Extremity Assessment: Defer to OT evaluation    Lower Extremity Assessment Lower Extremity Assessment: Generalized weakness (difficult to full assess due to increased fatigue/lethardy)       Communication   Communication: No difficulties  Cognition Arousal/Alertness: Lethargic, Awake/alert Behavior During Therapy: WFL for tasks assessed/performed Overall Cognitive Status: Impaired/Different from baseline Area of Impairment: Following commands, Memory, Safety/judgement, Awareness, Problem solving                     Memory: Decreased short-term memory Following Commands: Follows one step commands with increased time, Follows one step commands inconsistently Safety/Judgement: Decreased awareness of deficits, Decreased awareness of safety Awareness: Emergent Problem Solving: Slow processing, Decreased initiation, Difficulty sequencing, Requires verbal cues, Requires tactile cues General Comments: pt  initially awake asking to get to [facility], upset when PT told him that he could use the bedpan, decreased awareness of period of time with minimal awareness yesterday, pt able to follow one step commands about 75% of the time        General Comments General comments (skin integrity, edema, etc.): VSS on RA     Assessment/Plan    PT Assessment Patient needs continued PT services  PT Problem List Decreased strength;Decreased activity tolerance;Decreased balance;Decreased mobility;Decreased coordination;Decreased cognition;Decreased safety awareness       PT Treatment Interventions DME instruction;Gait training;Stair training;Functional mobility training;Therapeutic activities;Cognitive remediation;Patient/family education    PT Goals (Current goals can be found in the Care Plan section)  Acute Rehab PT Goals PT Goal Formulation: With patient/family Time For Goal Achievement: 11/09/21 Potential to Achieve Goals: Good    Frequency Min 3X/week        AM-PAC PT "6 Clicks" Mobility  Outcome Measure Help needed turning from your back to your side while in a flat bed without using bedrails?: A Lot Help needed moving from lying on your back to sitting on the side of a flat bed without using bedrails?: Total Help needed moving to and from a bed to a chair (including a wheelchair)?: Total Help needed standing up from a chair using your arms (e.g., wheelchair or bedside chair)?: Total Help needed to walk in hospital room?: Total Help needed climbing 3-5 steps with a railing? : Total 6 Click Score: 7    End of Session   Activity Tolerance: Patient limited by fatigue;Patient limited  by lethargy Patient left: in bed;with call bell/phone within reach;with family/visitor present Nurse Communication: Mobility status PT Visit Diagnosis: Muscle weakness (generalized) (M62.81);Difficulty in walking, not elsewhere classified (R26.2)    Time: 9357-0177 PT Time Calculation (min) (ACUTE ONLY): 36  min   Charges:   PT Evaluation $PT Eval Moderate Complexity: 1 Mod PT Treatments $Therapeutic Activity: 8-22 mins        Zaila Crew B. Migdalia Dk PT, DPT Acute Rehabilitation Services Pager [phone] Office [phone]   [place] 10/26/2021, 11:47 AM

## 2021-10-27 DIAGNOSIS — I5021 Acute systolic (congestive) heart failure: Secondary | ICD-10-CM | POA: Diagnosis not present

## 2021-10-27 DIAGNOSIS — N179 Acute kidney failure, unspecified: Secondary | ICD-10-CM | POA: Diagnosis not present

## 2021-10-27 DIAGNOSIS — R7881 Bacteremia: Secondary | ICD-10-CM | POA: Diagnosis not present

## 2021-10-27 DIAGNOSIS — I4891 Unspecified atrial fibrillation: Secondary | ICD-10-CM | POA: Diagnosis not present

## 2021-10-27 DIAGNOSIS — K8033 Calculus of bile duct with acute cholangitis with obstruction: Secondary | ICD-10-CM | POA: Diagnosis not present

## 2021-10-27 DIAGNOSIS — B955 Unspecified streptococcus as the cause of diseases classified elsewhere: Secondary | ICD-10-CM | POA: Diagnosis not present

## 2021-10-27 DIAGNOSIS — G9341 Metabolic encephalopathy: Secondary | ICD-10-CM | POA: Diagnosis not present

## 2021-10-27 DIAGNOSIS — I2510 Atherosclerotic heart disease of native coronary artery without angina pectoris: Secondary | ICD-10-CM | POA: Diagnosis not present

## 2021-10-27 LAB — GLUCOSE, CAPILLARY
Glucose-Capillary: 130 mg/dL — ABNORMAL HIGH (ref 70–99)
Glucose-Capillary: 119 mg/dL — ABNORMAL HIGH (ref 70–99)
Glucose-Capillary: 148 mg/dL — ABNORMAL HIGH (ref 70–99)
Glucose-Capillary: 217 mg/dL — ABNORMAL HIGH (ref 70–99)
Glucose-Capillary: 177 mg/dL — ABNORMAL HIGH (ref 70–99)
Glucose-Capillary: 164 mg/dL — ABNORMAL HIGH (ref 70–99)

## 2021-10-27 LAB — RENAL FUNCTION PANEL
Albumin: 2.7 g/dL — ABNORMAL LOW (ref 3.5–5.0)
Anion gap: 14 (ref 5–15)
BUN: 61 mg/dL — ABNORMAL HIGH (ref 8–23)
CO2: 21 mmol/L — ABNORMAL LOW (ref 22–32)
Calcium: 8 mg/dL — ABNORMAL LOW (ref 8.9–10.3)
Chloride: 109 mmol/L (ref 98–111)
Creatinine, Ser: 1.9 mg/dL — ABNORMAL HIGH (ref 0.61–1.24)
GFR, Estimated: 35 mL/min — ABNORMAL LOW (ref >60)
Glucose, Bld: 139 mg/dL — ABNORMAL HIGH (ref 70–99)
Phosphorus: 2.8 mg/dL (ref 2.5–4.6)
Potassium: 3.1 mmol/L — ABNORMAL LOW (ref 3.5–5.1)
Sodium: 144 mmol/L (ref 135–145)

## 2021-10-27 LAB — CBC
HCT: 31.7 % — ABNORMAL LOW (ref 39.0–52.0)
Hemoglobin: 10.8 g/dL — ABNORMAL LOW (ref 13.0–17.0)
MCH: 29.1 pg (ref 26.0–34.0)
MCHC: 34.1 g/dL (ref 30.0–36.0)
MCV: 85.4 fL (ref 80.0–100.0)
Platelets: 70 10*3/uL — ABNORMAL LOW (ref 150–400)
RBC: 3.71 MIL/uL — ABNORMAL LOW (ref 4.22–5.81)
RDW: 16.1 % — ABNORMAL HIGH (ref 11.5–15.5)
WBC: 9.6 10*3/uL (ref 4.0–10.5)
nRBC: 0 % (ref 0.0–0.2)

## 2021-10-27 LAB — HEPARIN LEVEL (UNFRACTIONATED): Heparin Unfractionated: <0.1 IU/mL — ABNORMAL LOW (ref 0.30–0.70)

## 2021-10-27 MED ORDER — SODIUM BICARBONATE 650 MG PO TABS
650.0000 mg | ORAL_TABLET | Freq: Two times a day (BID) | ORAL | Status: DC
  Administered 2021-10-27 (×2): 650 mg via ORAL
  Filled 2021-10-27 (×2): qty 1

## 2021-10-27 MED ORDER — SODIUM CHLORIDE 0.9 % IV SOLN
3.0000 g | Freq: Four times a day (QID) | INTRAVENOUS | Status: DC
  Administered 2021-10-27 – 2021-10-30 (×11): 3 g via INTRAVENOUS
  Filled 2021-10-27 (×11): qty 8

## 2021-10-27 MED ORDER — ENOXAPARIN SODIUM 120 MG/0.8ML IJ SOSY
1.0000 mg/kg | PREFILLED_SYRINGE | Freq: Two times a day (BID) | INTRAMUSCULAR | Status: DC
  Administered 2021-10-27 – 2021-10-29 (×5): 120 mg via SUBCUTANEOUS
  Filled 2021-10-27 (×8): qty 0.8

## 2021-10-27 MED ORDER — SODIUM CHLORIDE 0.9 % IV SOLN
3.0000 g | Freq: Three times a day (TID) | INTRAVENOUS | Status: DC
  Administered 2021-10-27: 3 g via INTRAVENOUS
  Filled 2021-10-27: qty 8

## 2021-10-27 MED ORDER — INSULIN ASPART 100 UNIT/ML IJ SOLN
0.0000 [IU] | Freq: Three times a day (TID) | INTRAMUSCULAR | Status: DC
  Administered 2021-10-27 – 2021-10-28 (×2): 3 [IU] via SUBCUTANEOUS
  Administered 2021-10-29: 5 [IU] via SUBCUTANEOUS
  Administered 2021-10-29 – 2021-10-31 (×8): 3 [IU] via SUBCUTANEOUS
  Administered 2021-11-01: 2 [IU] via SUBCUTANEOUS
  Administered 2021-11-01: 3 [IU] via SUBCUTANEOUS
  Administered 2021-11-01 – 2021-11-02 (×3): 2 [IU] via SUBCUTANEOUS
  Administered 2021-11-02: 3 [IU] via SUBCUTANEOUS
  Administered 2021-11-03: 2 [IU] via SUBCUTANEOUS
  Administered 2021-11-03: 3 [IU] via SUBCUTANEOUS
  Administered 2021-11-04: 2 [IU] via SUBCUTANEOUS
  Administered 2021-11-04 (×2): 3 [IU] via SUBCUTANEOUS
  Administered 2021-11-05 (×3): 2 [IU] via SUBCUTANEOUS

## 2021-10-27 MED ORDER — LABETALOL HCL 5 MG/ML IV SOLN
10.0000 mg | INTRAVENOUS | Status: DC | PRN
  Administered 2021-10-27: 10 mg via INTRAVENOUS
  Filled 2021-10-27: qty 4

## 2021-10-27 MED ORDER — METOPROLOL TARTRATE 25 MG PO TABS
25.0000 mg | ORAL_TABLET | Freq: Two times a day (BID) | ORAL | Status: AC
  Administered 2021-10-27 – 2021-10-29 (×6): 25 mg via ORAL
  Filled 2021-10-27 (×6): qty 1

## 2021-10-27 MED ORDER — POTASSIUM CHLORIDE CRYS ER 20 MEQ PO TBCR
40.0000 meq | EXTENDED_RELEASE_TABLET | Freq: Once | ORAL | Status: AC
  Administered 2021-10-27: 40 meq via ORAL
  Filled 2021-10-27: qty 2

## 2021-10-27 MED ORDER — INSULIN ASPART 100 UNIT/ML IJ SOLN: 0.0000 [IU] | Freq: Three times a day (TID) | INTRAMUSCULAR | Status: DC

## 2021-10-27 NOTE — Progress Notes (Signed)
Mr. Kerney transferred from 27M to 2w24. Alert and oriented x4. No complaints of pain. Skin assessed with Lars Mage, RN. Connected to telemetry monitor. Wife at bedside. Oriented to room, call bell, and bed controls. Bed placed in lowest position.

## 2021-10-27 NOTE — Progress Notes (Signed)
ANTICOAGULATION CONSULT NOTE - Follow Up Consult  Pharmacy Consult for heparin Indication: atrial fibrillation  Labs: Recent Labs    10/24/21 0411 10/24/21 2226 10/25/21 0029 10/25/21 1448 10/26/21 0112 10/26/21 0710 10/26/21 1243 10/26/21 2217  HGB 9.4* 10.9* 11.1*  --  10.2*  --   --   --   HCT 30.2* 32.0* 33.7*  --  31.2*  --   --   --   PLT 75*  --  63*  --  72*  --   --   --   HEPARINUNFRC  --   --   --    < > <0.10* <0.10* <0.10* <0.10*  CREATININE 3.19*  --  4.42*  --  3.65*  --   --   --    < > = values in this interval not displayed.    Assessment: 81yo male subtherapeutic on heparin after rate change; no infusion issues or signs of bleeding per RN.  Goal of Therapy:  Heparin level 0.3-0.7 units/ml   Plan:  Will increase heparin infusion by 4 units/kgABW/hr to 2600 units/hr and check level in 8 hours.    Wynona Neat, PharmD, BCPS  10/27/2021,12:04 AM

## 2021-10-27 NOTE — Progress Notes (Signed)
[facility] will take over as primary tomorrow morning at 7 am. Transfer order placed.

## 2021-10-27 NOTE — Progress Notes (Signed)
[facility] NEPHROLOGY PROGRESS NOTE  Assessment/ Plan: Pt is a 81 y.o. yo male  with history of HTN, HLD, DM, CAD status post CABG, A-fib, CKD with baseline creatinine level seems to be around 1.3-1.6, with cholangitis, E. coli and streptococcal bacteremia consulted for AKI on CKD.  # Acute kidney injury on CKD stage III likely  ATN in the setting of sepsis/bacteremia concomitant with the use of IV contrast and liver disease.  UA is cloudy and CT scan ruled out obstruction.  Treated with IV fluid with improvement of urine output and creatinine level trending down.  Lab pending from this morning. No need for IV fluid, encourage oral intake.  The creatinine level expect to improve gradually.  No need for dialysis.  Discussed with ICU team.    #Sepsis due to E. coli and Streptococcus bacteremia: Currently on Unasyn per ID and plan for TEE noted.   #Ascending colitis status post ERCP and removal of stone and sphincterotomy: Seen by GI, trend liver enzyme.   #Acute metabolic encephalopathy probably due to Ativan, liver disease.  Recommend to avoid binge use or sedatives.  Mental status significantly improved.  #A-fib with RVR: On heparin and rate control per ICU team.  #Metabolic acidosis: Start oral sodium bicarbonate.  Subjective: Seen and examined in ICU.  The urine output is recorded around 1.8 L.  The labs are pending from today.  Patient is alert awake and following simple commands.    Objective Vital signs in last 24 hours: Vitals:   10/27/21 0500 10/27/21 0600 10/27/21 0613 10/27/21 0700  BP: (!) 165/59 127/73  (!) 145/72  Pulse: 81 67 63 66  Resp: (!) 28 (!) 21 (!) 24 (!) 23  Temp:    98.8 F (37.1 C)  TempSrc:    Oral  SpO2: 97% 99% 98% 98%  Weight:      Height:       Weight change: -0.7 kg  Intake/Output Summary (Last 24 hours) at 10/27/2021 0836 Last data filed at 10/27/2021 0600 Gross per 24 hour  Intake 1392.21 ml  Output 1775 ml  Net -382.79 ml         Labs: Basic Metabolic Panel: Recent Labs  Lab 10/24/21 0411 10/24/21 2226 10/25/21 0029 10/26/21 0112  NA 138 137 140 141  K 3.7 4.2 4.4 3.6  CL 99  --  102 106  CO2 23  --  21* 19*  GLUCOSE 374*  --  96 114*  BUN 37*  --  57* 71*  CREATININE 3.19*  --  4.42* 3.65*  CALCIUM 7.1*  --  8.4* 7.8*  PHOS 5.8*  --   --   --     Liver Function Tests: Recent Labs  Lab 10/24/21 0411 10/25/21 0029 10/26/21 0112  AST 1,557* 1,104* 357*  ALT 849* 905* 515*  ALKPHOS 106 150* 141*  BILITOT 3.7* 3.5* 1.9*  PROT 4.8* 5.6* 5.5*  ALBUMIN 2.3* 2.6* 2.7*    Recent Labs  Lab 10/23/21 0334 10/24/21 0411  LIPASE 390* 109*    Recent Labs  Lab 10/23/21 0401 10/26/21 0811  AMMONIA 57* 26    CBC: Recent Labs  Lab 10/23/21 0334 10/23/21 0431 10/23/21 0933 10/23/21 2154 10/24/21 0411 10/24/21 2226 10/25/21 0029 10/26/21 0112  WBC 7.9  --  14.8* 16.2* 11.4*  --  9.3 12.0*  NEUTROABS 7.4  --   --  14.4*  --   --  8.4*  --   HGB 12.9*   < >  12.0* 9.5* 9.4* 10.9* 11.1* 10.2*  HCT 40.6   < > 38.3* 30.6* 30.2* 32.0* 33.7* 31.2*  MCV 93.8  --  94.3 95.3 92.4  --  89.2 88.9  PLT 193  --  149* 96* 75*  --  63* 72*   < > = values in this interval not displayed.    Cardiac Enzymes: No results for input(s): CKTOTAL, CKMB, CKMBINDEX, TROPONINI in the last 168 hours. CBG: Recent Labs  Lab 10/26/21 1545 10/26/21 1954 10/26/21 2341 10/27/21 0354 10/27/21 0757  GLUCAP 119* 129* 117* 130* 119*     Iron Studies: No results for input(s): IRON, TIBC, TRANSFERRIN, FERRITIN in the last 72 hours. Studies/Results: No results found.  Medications: Infusions:  amiodarone 30 mg/hr (10/27/21 0600)   ampicillin-sulbactam (UNASYN) IV Stopped (10/26/21 2248)   heparin 2,600 Units/hr (10/27/21 0600)    Scheduled Medications:  chlorhexidine  15 mL Mouth Rinse BID   Chlorhexidine Gluconate Cloth  6 each Topical Q0600   insulin aspart  0-15 Units Subcutaneous Q4H    lactulose  30 g Oral Daily   mouth rinse  15 mL Mouth Rinse q12n4p   tamsulosin  0.4 mg Oral Daily    have reviewed scheduled and prn medications.  Physical Exam: General: Alert awake, not in distress Heart:RRR, s1s2 nl Lungs:clear b/l, no crackle Abdomen:soft, Non-tender, non-distended Extremities:No peripheral edema. Neurology: Alert, awake and following commands  Rakeem Colley Tanna Furry 10/27/2021,8:36 AM  LOS: 4 days

## 2021-10-27 NOTE — Progress Notes (Signed)
NAME:  [name] Ruffini., MRN:  [id number], DOB:  [birth year], LOS: 4 ADMISSION DATE:  10/23/2021, CONSULTATION DATE:  10/23/2021 REFERRING MD: Orlena Sheldon, MD CHIEF COMPLAINT:  abdominal pain   History of Present Illness:  [name] Young is more responsive this morning on exam Jr. Is a 81 y.o. male with a PMH significant for CAD s/p CAGB 2019 A-fib,, HTN. HLD, diabetes, GERD, and CKD who presented to the ED for complaints of abdominal pain with associated nausea and vomiting  that began the day prior to admission at approximately 1600.    On ED arrival patient was seen in A-fib RVR, hypertensive, and tachypnea. Labwork significant for creatinine 1.91, elevated LFTs, lipase 390, total bill 5.9, lactic acid 8.9. CT head negative but CTA chest/abd/pelvis reveled dilated bile duct, acute cholelithiasis without cholecystis. GI was consulted and recommended urgent MRCP to better characterize biliary system. Given sepsis criteria on admission PCCM was consulted for further management and admission.   Pertinent  Medical History  CAD s/p CAGB 2019 A-fib,, HTN. HLD, diabetes, GERD, and CKD  Significant Hospital Events: Including procedures, antibiotic start and stop dates in addition to other pertinent events   2/12 Admitted with sepsis felt secondary to GI source  2/13 Initially given Zosyn then switched to Ceftriaxone and Metronidazole for bacteremia. Oliguric AKI. Confusion and trying to get out of bed , given one dose of ativan and continued on PRN Haldol 2/14 Antibiotics switched to Unasyn based on susceptibility.    Interim History / Subjective:  E-link paged for HTN,   Objective   Blood pressure 127/73, pulse 63, temperature 98.6 F (37 C), temperature source Oral, resp. rate (!) 24, height 5\' 11"  (1.803 m), weight 118.5 kg, SpO2 98 %.        Intake/Output Summary (Last 24 hours) at 10/27/2021 4098 Last data filed at 10/27/2021 0600 Gross per 24 hour  Intake 1412.21 ml  Output 1975 ml  Net  -562.79 ml    Filed Weights   10/25/21 0414 10/26/21 0408 10/27/21 0130  Weight: 118.2 kg 119.2 kg 118.5 kg    Examination: General: No acute distress, elderly man has hearing impairment, calm CV: Regular pulse, normal rate, no murmurs, no lower extremity edema, no JVD Pulmonary: Effort normal, no adventitious breath sounds Abdomen: Soft, nontender, bowel sounds present Neuro: awake on approach. Alert and oriented x4 ( improved from previous day)  CBC and RFP pending  1875 urine output in last 24 hours Resolved Hospital Problem list     Assessment & Plan:  Acute metabolic encephalopathy secondary to sepsis  Streptococcus Infantarius and E.Coli Bacteremia  -ascending cholangitis and bacteremia, now status post ERCP with 2 stones removed and sphincterotomy on 2/12 -On exam this morning patient is able to answer orientation questions correctly, awake on my approach and more alert than he was yesterday. - Patient continues to have good urine output, labs are pending this morning - Plan for TEE on Friday - She will need colonoscopy and surgical consult for discussion of cholecystectomy in the future - Continue Unasyn-repeat blood cultures NGTD 3 days   AKI on CKD stage III Baseline creatinine 1.4-1.6 -Nephrology following - ATN from sepsis - Improving, monitor urine output and RFP this morning -More alert this morning, encourage PO intake -Oral sodium bicarb started this morning  Acute liver injury -Liver enzymes trending down 2/15.   -CMP tomorrow to monitor trend.    Acute HFmrEF, in setting of sepsis. History CAD s/p CABG History of HTN, HLD -  Ischemic eval per cardiology when stable. -Holding Aspirin for 1 week status post ERCP - Holding Crestor in setting of elevated LFT's, will restart as enzymes normalize not to confuse picture.  Atrial fibrillation -Cardiology following -On heparin IV infusion and rate control with amiodarone   BPH - History of previously  treated BPH, not on medication on admission - Continue tamsulosin, and have patient follow-up with his urologist outpatient  T2DM - Hemoglobin A1c 6.1 on 2/12 - Sitagliptin metformin at home, hold home medications in setting of AKI - CBGs at hospital goal - SSI-M  Best Practice (right click and "Reselect all SmartList Selections" daily)   Diet/type: Regular consistency (see orders) DVT prophylaxis: systemic heparin GI prophylaxis: N/A Lines: N/A Foley:  Yes, and it is still needed Code Status:  full code Last date of multidisciplinary goals of cxare discussion [x]

## 2021-10-27 NOTE — Progress Notes (Signed)
Pharmacy Antibiotic Note  [name] Young. is a 81 y.o. male admitted on 10/23/2021 with E.coli and Strep infantarius bacteremia. Pharmacy has been consulted for Unasyn dosing.  Known baseline around 1.3-1.5 however with AKI this admission with SCr peak of 4.42 on 2/14 - now back down to 1.9. Will adjust Unasyn accordingly.   Plan: - Adjust Unasyn to 3g IV every 6 hours - Will continue to follow renal function, culture results, LOT, and antibiotic de-escalation plans   Height: 5\' 11"  (180.3 cm) Weight: 118.5 kg (261 lb 3.9 oz) IBW/kg (Calculated) : 75.3  Temp (24hrs), Avg:98.6 F (37 C), Min:98.1 F (36.7 C), Max:99.3 F (37.4 C)  Recent Labs  Lab 10/23/21 2154 10/24/21 0411 10/24/21 0736 10/24/21 1159 10/24/21 1508 10/25/21 0029 10/26/21 0112 10/27/21 0852  WBC 16.2* 11.4*  --   --   --  9.3 12.0* 9.6  CREATININE 2.67* 3.19*  --   --   --  4.42* 3.65* 1.90*  LATICACIDVEN  --  5.1* 5.7* 5.4* 5.1* 3.0*  --   --      Estimated Creatinine Clearance: 41.3 mL/min (A) (by C-G formula based on SCr of 1.9 mg/dL (H)).    No Known Allergies  Antimicrobials this admission: Zosyn 2/12 x 1 Rocephin 2/12 >> 2/14 Flagyl 2/12 >> 2/14 Unasyn 2/14 >>  Dose adjustments this admission: N/a  Microbiology results: 2/12 BCx >> 4/4 E.coli (pan-S) + Strep infantarius (S-PCN/CTX/Vanc, R-LVQ/erythromycin)  Thank you for allowing pharmacy to be a part of this patients care.  Alycia Rossetti, PharmD, BCPS Clinical Pharmacist 10/27/2021 6:39 PM   **Pharmacist phone directory can now be found on [email or web address] ([email or web address]).  Listed under [place].

## 2021-10-27 NOTE — Progress Notes (Signed)
[facility] Progress Note Patient Name: [name] Young. DOB: 11-27-1940 MRN: [id number]   Date of Service  10/27/2021  HPI/Events of Note  Hypertension - BP = 162/54. LVEF = 40-45%  eICU Interventions  Plan: Add Labetalol 10 mg IV Q 2 hours PRN SBP > 160. Hold dose for HR < 60.     Intervention Category Major Interventions: Hypertension - evaluation and management  [name] [place] 10/27/2021, 4:51 AM

## 2021-10-27 NOTE — Progress Notes (Signed)
Progress Note  Patient Name: [name] Young. Date of Encounter: 10/27/2021  Primary Cardiologist: Nelva Bush, MD   Subjective   Patient see and examined at his bedside.  He was awake and alert at the time of my encounter.  His wife was by the bedside.  Inpatient Medications    Scheduled Meds:  chlorhexidine  15 mL Mouth Rinse BID   Chlorhexidine Gluconate Cloth  6 each Topical Q0600   enoxaparin (LOVENOX) injection  1 mg/kg Subcutaneous Q12H   insulin aspart  0-15 Units Subcutaneous Q4H   lactulose  30 g Oral Daily   mouth rinse  15 mL Mouth Rinse q12n4p   metoprolol tartrate  25 mg Oral BID   potassium chloride  40 mEq Oral Once   sodium bicarbonate  650 mg Oral BID   tamsulosin  0.4 mg Oral Daily   Continuous Infusions:  ampicillin-sulbactam (UNASYN) IV     PRN Meds: docusate sodium, hydrALAZINE, labetalol, ondansetron (ZOFRAN) IV, polyethylene glycol   Vital Signs    Vitals:   10/27/21 0800 10/27/21 0900 10/27/21 1000 10/27/21 1100  BP: (!) 150/61 (!) 160/55 (!) 150/49   Pulse: 71 67 66   Resp: (!) 26 (!) 25 (!) 24   Temp:    98.2 F (36.8 C)  TempSrc:    Axillary  SpO2: 98% 98% 99%   Weight:      Height:        Intake/Output Summary (Last 24 hours) at 10/27/2021 1209 Last data filed at 10/27/2021 0600 Gross per 24 hour  Intake 1292.21 ml  Output 1575 ml  Net -282.79 ml   Filed Weights   10/25/21 0414 10/26/21 0408 10/27/21 0130  Weight: 118.2 kg 119.2 kg 118.5 kg    Telemetry    Atrial fibrillation with ventricular rate- Personally Reviewed  ECG    Patient fibrillation with rapid ventricular rate- Personally Reviewed  Physical Exam    General: Not in acute distress,  Head: Atraumatic, normal size  Eyes: PEERLA, EOMI  Neck: Supple, normal JVD Cardiac: Normal S1, S2; RRR; no murmurs, rubs, or gallops Lungs: Clear to auscultation bilaterally Abd: Soft, nontender, no hepatomegaly  Ext: warm, no edema Musculoskeletal: No  deformities, BUE and BLE strength normal and equal Skin: Warm and dry, no rashes   Neuro: Awake alert and oriented x3  Labs    Chemistry Recent Labs  Lab 10/24/21 0411 10/24/21 2226 10/25/21 0029 10/26/21 0112 10/27/21 0852  NA 138   < > 140 141 144  K 3.7   < > 4.4 3.6 3.1*  CL 99  --  102 106 109  CO2 23  --  21* 19* 21*  GLUCOSE 374*  --  96 114* 139*  BUN 37*  --  57* 71* 61*  CREATININE 3.19*  --  4.42* 3.65* 1.90*  CALCIUM 7.1*  --  8.4* 7.8* 8.0*  PROT 4.8*  --  5.6* 5.5*  --   ALBUMIN 2.3*  --  2.6* 2.7* 2.7*  AST 1,557*  --  1,104* 357*  --   ALT 849*  --  905* 515*  --   ALKPHOS 106  --  150* 141*  --   BILITOT 3.7*  --  3.5* 1.9*  --   GFRNONAA 19*  --  13* 16* 35*  ANIONGAP 16*  --  17* 16* 14   < > = values in this interval not displayed.     Hematology Recent Labs  Lab 10/25/21 [phone]  10/26/21 0112 10/27/21 0852  WBC 9.3 12.0* 9.6  RBC 3.78* 3.51* 3.71*  HGB 11.1* 10.2* 10.8*  HCT 33.7* 31.2* 31.7*  MCV 89.2 88.9 85.4  MCH 29.4 29.1 29.1  MCHC 32.9 32.7 34.1  RDW 15.8* 15.9* 16.1*  PLT 63* 72* 70*    Cardiac EnzymesNo results for input(s): TROPONINI in the last 168 hours. No results for input(s): TROPIPOC in the last 168 hours.   BNPNo results for input(s): BNP, PROBNP in the last 168 hours.   DDimer No results for input(s): DDIMER in the last 168 hours.   Radiology    No results found.  Cardiac Studies   TTE 10/23/2021 IMPRESSIONS     1. Left ventricular ejection fraction, by estimation, is 40 to 45%. The  left ventricle has mildly decreased function. The left ventricle  demonstrates global hypokinesis. Left ventricular diastolic parameters are  consistent with Grade II diastolic  dysfunction (pseudonormalization). Elevated left ventricular end-diastolic  pressure.   2. Right ventricular systolic function is normal. The right ventricular  size is normal. There is normal pulmonary artery systolic pressure. The  estimated right  ventricular systolic pressure is 24.2 mmHg.   3. Left atrial size was mildly dilated.   4. The mitral valve is degenerative. Trivial mitral valve regurgitation.  No evidence of mitral stenosis.   5. The aortic valve is calcified. There is moderate calcification of the  aortic valve. There is moderate thickening of the aortic valve. Aortic  valve regurgitation is not visualized. Aortic valve  sclerosis/calcification is present, without any evidence  of aortic stenosis.   6. There is mild dilatation of the aortic root, measuring 41 mm. There is  mild dilatation of the ascending aorta, measuring 38 mm.   7. The inferior vena cava is dilated in size with >50% respiratory  variability, suggesting right atrial pressure of 8 mmHg.   FINDINGS   Left Ventricle: Left ventricular ejection fraction, by estimation, is 40  to 45%. The left ventricle has mildly decreased function. The left  ventricle demonstrates global hypokinesis. The left ventricular internal  cavity size was normal in size. There is   no left ventricular hypertrophy. Left ventricular diastolic parameters  are consistent with Grade II diastolic dysfunction (pseudonormalization).  Elevated left ventricular end-diastolic pressure.   Right Ventricle: The right ventricular size is normal. No increase in  right ventricular wall thickness. Right ventricular systolic function is  normal. There is normal pulmonary artery systolic pressure. The tricuspid  regurgitant velocity is 2.02 m/s, and   with an assumed right atrial pressure of 8 mmHg, the estimated right  ventricular systolic pressure is 68.3 mmHg.   Left Atrium: Left atrial size was mildly dilated.   Right Atrium: Right atrial size was normal in size.   Pericardium: There is no evidence of pericardial effusion.   Mitral Valve: The mitral valve is degenerative in appearance. There is  moderate thickening of the mitral valve leaflet(s). There is moderate  calcification of the  mitral valve leaflet(s). Mild mitral annular  calcification. Trivial mitral valve  regurgitation. No evidence of mitral valve stenosis.   Tricuspid Valve: The tricuspid valve is normal in structure. Tricuspid  valve regurgitation is trivial. No evidence of tricuspid stenosis.   Aortic Valve: The aortic valve is calcified. There is moderate  calcification of the aortic valve. There is moderate thickening of the  aortic valve. Aortic valve regurgitation is not visualized. Aortic valve  sclerosis/calcification is present, without any   evidence of aortic  stenosis.   Pulmonic Valve: The pulmonic valve was normal in structure. Pulmonic valve  regurgitation is trivial. No evidence of pulmonic stenosis.   Aorta: The aortic root is normal in size and structure. There is mild  dilatation of the aortic root, measuring 41 mm. There is mild dilatation  of the ascending aorta, measuring 38 mm.   Venous: The inferior vena cava is dilated in size with greater than 50%  respiratory variability, suggesting right atrial pressure of 8 mmHg.   IAS/Shunts: No atrial level shunt detected by color flow Doppler.       Patient Profile     81 y.o. male hx of 3v CAD with CABG 2019, with post op a fib, HTN, HLD, DM-2, CKD 3, now admitted with abd pain due to biliary tract obstruction and ascending cholangitis undergoing ERCP removing 2 stones, sepsis and a fib who is being seen 10/24/2021 for the evaluation of atrial fib and new CM with EF 40-45%.  Assessment & Plan   Atrial fibrillation -now tolerating p.o. restart his Lopressor.  Continue he is on heparin drip.  CAD-slightly elevated troponin in the setting of his infection.  Will reevaluate for need of ischemic evaluation once acute issues have resolved.  For now continue with medical therapy aspirin 81 mg daily, Crestor.  Depressed ejection fraction-does not appear to be volume overloaded.  Cautious diuretic as clinically indicated.  Back on beta-blocker  will eventually transition to Toprol-XL.  Plan to add Entresto soon.  Sepsis secondary to E. coli and strep to coccus species bacteremia in the setting of acute ascending cholangitis now status post ERCP-ID following now on Unasyn.   Plan for TEE on Friday.  Shared Decision Making/Informed Consent The risks [esophageal damage, perforation (1:10,000 risk), bleeding, pharyngeal hematoma as well as other potential complications associated with conscious sedation including aspiration, arrhythmia, respiratory failure and death], benefits (treatment guidance and diagnostic support) and alternatives of a transesophageal echocardiogram were discussed in detail with Mr. Rosana Berger and he is willing to proceed.    His wife is agreeable as well for this procedure.  She was at the bedside.  Hypertension-blood pressure is elevated but he is just restarted his metoprolol we will monitor and make adjustments as appropriate.  Acute metabolic encephalopathy- resolved   Type 2 diabetes AKI Chronic anemia  All managed by primary ICU team.  CRITICAL CARE Performed by: Berniece Salines  Total critical care time: 30 minutes. Critical care time was exclusive of separately billable procedures and treating other patients. Critical care was necessary to treat or prevent imminent or life-threatening deterioration. Critical care was time spent personally by me on the following activities: development of treatment plan with patient and/or surrogate as well as nursing, discussions with consultants, evaluation of patient's response to treatment, examination of patient, obtaining history from patient or surrogate, ordering and performing treatments and interventions, ordering and review of laboratory studies, ordering and review of radiographic studies, pulse oximetry and re-evaluation of patient's condition.   Signed, Berniece Salines, DO [place]  10/27/2021 12:09 PM     For questions or updates, please  contact [facility] Please consult [email or web address] for contact info under Cardiology/STEMI.

## 2021-10-27 NOTE — Progress Notes (Signed)
[facility]  Date of Admission:  10/23/2021           Reason for visit: Follow up on cholangitis/bacteremia  Current antibiotics: Unasyn 2/14 -- present   Previous antibiotics: Pip Tazo 2/12 x 1 dose Ceftriaxone 2/12 -- 2/14 Metronidazole 2/12 -- 2/14   ASSESSMENT:    81 y.o. male admitted with:  Strep infantarius and E coli bacteremia: Secondary to cholangitis s/p ERCP with GI and removal of gallstones. Severe sepsis: due to #1 and improving. Acute kidney injury: Improved on labs yesterday.  Seen by nephrology.  Elevated LFTs, T bili: Improved on labs yesterday. Encephalopathy: Improving.   RECOMMENDATIONS:    Continue with Unasyn dosed for kidney function. TEE tomorrow Follow blood cultures.  NGTD Will need evaluation for colonic neoplasia when more stabilized although current bacteremia is secondary to cholangitis Lab monitoring Will follow Discussed with primary team   Principal Problem:   Bacteremia due to Streptococcus Active Problems:   Coronary artery disease   Paroxysmal A-fib (HCC)   Sepsis ([place])   Calculus of bile duct with cholangitis and obstruction   Jaundice   Elevated LFTs   Hyperbilirubinemia   Bacteremia due to Escherichia coli    MEDICATIONS:    Scheduled Meds:  chlorhexidine  15 mL Mouth Rinse BID   Chlorhexidine Gluconate Cloth  6 each Topical Q0600   insulin aspart  0-15 Units Subcutaneous Q4H   lactulose  30 g Oral Daily   mouth rinse  15 mL Mouth Rinse q12n4p   tamsulosin  0.4 mg Oral Daily   Continuous Infusions:  amiodarone 30 mg/hr (10/27/21 0600)   ampicillin-sulbactam (UNASYN) IV Stopped (10/26/21 2248)   heparin 2,600 Units/hr (10/27/21 0600)   PRN Meds:.docusate sodium, hydrALAZINE, labetalol, ondansetron (ZOFRAN) IV, polyethylene glycol  SUBJECTIVE:   24 hour events:  No acute events noted overnight other than hypertension Afebrile, Tmax 99.3 No labs back this morning No new  micro No new imaging  No complaints Much more alert Sitting up in bed No fevers, chills No abdominal pain  Review of Systems  All other systems reviewed and are negative.    OBJECTIVE:   Blood pressure (!) 145/72, pulse 66, temperature 98.6 F (37 C), temperature source Oral, resp. rate (!) 23, height 5\' 11"  (1.803 m), weight 118.5 kg, SpO2 98 %. Body mass index is 36.44 kg/m.  Physical Exam Constitutional:      General: He is not in acute distress. HENT:     Head: Normocephalic and atraumatic.  Eyes:     Extraocular Movements: Extraocular movements intact.     Conjunctiva/sclera: Conjunctivae normal.  Pulmonary:     Effort: Pulmonary effort is normal. No respiratory distress.  Abdominal:     Palpations: Abdomen is soft.     Tenderness: There is no abdominal tenderness. There is no guarding or rebound.  Neurological:     General: No focal deficit present.     Mental Status: He is alert and oriented to person, place, and time.  Psychiatric:        Mood and Affect: Mood normal.        Behavior: Behavior normal.     Lab Results: Lab Results  Component Value Date   WBC 12.0 (H) 10/26/2021   HGB 10.2 (L) 10/26/2021   HCT 31.2 (L) 10/26/2021   MCV 88.9 10/26/2021   PLT 72 (L) 10/26/2021    Lab Results  Component Value Date   NA 141 10/26/2021  K 3.6 10/26/2021   CO2 19 (L) 10/26/2021   GLUCOSE 114 (H) 10/26/2021   BUN 71 (H) 10/26/2021   CREATININE 3.65 (H) 10/26/2021   CALCIUM 7.8 (L) 10/26/2021   GFRNONAA 16 (L) 10/26/2021   GFRAA 55 (L) 09/29/2020    Lab Results  Component Value Date   ALT 515 (H) 10/26/2021   AST 357 (H) 10/26/2021   ALKPHOS 141 (H) 10/26/2021   BILITOT 1.9 (H) 10/26/2021    No results found for: CRP  No results found for: ESRSEDRATE   I have reviewed the micro and lab results in Epic.  Imaging: No results found.   Imaging independently reviewed in Epic.    Raynelle [facility] for Infectious Disease [facility] [phone] pager 10/27/2021, 8:09 AM

## 2021-10-28 ENCOUNTER — Inpatient Hospital Stay (HOSPITAL_COMMUNITY): Payer: Medicare PPO

## 2021-10-28 ENCOUNTER — Inpatient Hospital Stay: Payer: Self-pay

## 2021-10-28 ENCOUNTER — Inpatient Hospital Stay (HOSPITAL_COMMUNITY): Payer: Medicare PPO | Admitting: Anesthesiology

## 2021-10-28 ENCOUNTER — Encounter (HOSPITAL_COMMUNITY)
Admission: EM | Disposition: A | Discharge status: Skilled Nursing Facility | Payer: Self-pay | Source: Home / Self Care | Attending: Internal Medicine

## 2021-10-28 ENCOUNTER — Other Ambulatory Visit (HOSPITAL_COMMUNITY): Payer: Self-pay

## 2021-10-28 ENCOUNTER — Encounter (HOSPITAL_COMMUNITY): Payer: Self-pay | Admitting: Pulmonary Disease

## 2021-10-28 DIAGNOSIS — R7881 Bacteremia: Secondary | ICD-10-CM

## 2021-10-28 DIAGNOSIS — K8033 Calculus of bile duct with acute cholangitis with obstruction: Secondary | ICD-10-CM | POA: Diagnosis not present

## 2021-10-28 DIAGNOSIS — I340 Nonrheumatic mitral (valve) insufficiency: Secondary | ICD-10-CM

## 2021-10-28 DIAGNOSIS — N179 Acute kidney failure, unspecified: Secondary | ICD-10-CM | POA: Diagnosis not present

## 2021-10-28 HISTORY — PX: TEE WITHOUT CARDIOVERSION: SHX5443

## 2021-10-28 LAB — CBC
HCT: 32 % — ABNORMAL LOW (ref 39.0–52.0)
Hemoglobin: 11 g/dL — ABNORMAL LOW (ref 13.0–17.0)
MCH: 29.3 pg (ref 26.0–34.0)
MCHC: 34.4 g/dL (ref 30.0–36.0)
MCV: 85.1 fL (ref 80.0–100.0)
Platelets: 92 10*3/uL — ABNORMAL LOW (ref 150–400)
RBC: 3.76 MIL/uL — ABNORMAL LOW (ref 4.22–5.81)
RDW: 16.5 % — ABNORMAL HIGH (ref 11.5–15.5)
WBC: 11 10*3/uL — ABNORMAL HIGH (ref 4.0–10.5)
nRBC: 0.2 % (ref 0.0–0.2)

## 2021-10-28 LAB — BASIC METABOLIC PANEL
Anion gap: 10 (ref 5–15)
BUN: 49 mg/dL — ABNORMAL HIGH (ref 8–23)
CO2: 24 mmol/L (ref 22–32)
Calcium: 7.9 mg/dL — ABNORMAL LOW (ref 8.9–10.3)
Chloride: 108 mmol/L (ref 98–111)
Creatinine, Ser: 1.51 mg/dL — ABNORMAL HIGH (ref 0.61–1.24)
GFR, Estimated: 47 mL/min — ABNORMAL LOW (ref >60)
Glucose, Bld: 157 mg/dL — ABNORMAL HIGH (ref 70–99)
Potassium: 3.2 mmol/L — ABNORMAL LOW (ref 3.5–5.1)
Sodium: 142 mmol/L (ref 135–145)

## 2021-10-28 LAB — GLUCOSE, CAPILLARY
Glucose-Capillary: 141 mg/dL — ABNORMAL HIGH (ref 70–99)
Glucose-Capillary: 148 mg/dL — ABNORMAL HIGH (ref 70–99)
Glucose-Capillary: 153 mg/dL — ABNORMAL HIGH (ref 70–99)
Glucose-Capillary: 196 mg/dL — ABNORMAL HIGH (ref 70–99)
Glucose-Capillary: 201 mg/dL — ABNORMAL HIGH (ref 70–99)

## 2021-10-28 LAB — PROCALCITONIN: Procalcitonin: 13.32 ng/mL

## 2021-10-28 LAB — C-REACTIVE PROTEIN: CRP: 25.4 mg/dL — ABNORMAL HIGH (ref <1.0)

## 2021-10-28 SURGERY — ECHOCARDIOGRAM, TRANSESOPHAGEAL
Anesthesia: Monitor Anesthesia Care

## 2021-10-28 MED ORDER — POTASSIUM CHLORIDE CRYS ER 20 MEQ PO TBCR
40.0000 meq | EXTENDED_RELEASE_TABLET | Freq: Once | ORAL | Status: AC
  Administered 2021-10-28: 40 meq via ORAL
  Filled 2021-10-28: qty 2

## 2021-10-28 MED ORDER — PROPOFOL 500 MG/50ML IV EMUL
INTRAVENOUS | Status: DC | PRN
  Administered 2021-10-28: 150 ug/kg/min via INTRAVENOUS

## 2021-10-28 MED ORDER — SODIUM CHLORIDE 0.9 % IV SOLN
INTRAVENOUS | Status: DC
  Administered 2021-10-28: 500 mL via INTRAVENOUS

## 2021-10-28 MED ORDER — MELATONIN 3 MG PO TABS
3.0000 mg | ORAL_TABLET | Freq: Every evening | ORAL | Status: DC | PRN
  Administered 2021-10-29 – 2021-11-04 (×3): 3 mg via ORAL
  Filled 2021-10-28 (×3): qty 1

## 2021-10-28 NOTE — Progress Notes (Signed)
Consent obtained from wife at bedside for PICC due to patient having recently had anesthesia.  Patient very sleepy, explained procedure to both.

## 2021-10-28 NOTE — Progress Notes (Addendum)
Occupational Therapy Treatment Patient Details Name: [name] Young. MRN: [id number] DOB: Jun 05, 1941 Today's Date: 10/28/2021   History of present illness 81 y.o. male presented to the ED 10/23/21 with complaints of abdominal pain with associated nausea and vomiting that began the day before. In ED pt found to be in A-fib RVR, hypertensive, and tachypnea. CTA chest/abd/pelvis reveled dilated bile duct, acute cholelithiasis without cholecystis found to have cholangitis status post ERCP with gastroenterology on 10/23/2021.  His blood cultures on admission are positive for E. coli and Streptococcus species.  PMH significant for CAD s/p CAGB 2019 A-fib,, HTN. HLD, diabetes, GERD, and CKD   OT comments  Pt much more alert than on initial OT eval. Pt able to progress standing and BSC transfers (use of Stedy) though still requires +2 physical assist to safely complete these tasks. Pt requires Total A for LB ADLs including peri care today. Wife present, eager for pt to participate with therapies and hopeful that he could progress home. Per wife, pt would need to be able to ambulate from car <> house to DC home safely.    Recommendations for follow up therapy are one component of a multi-disciplinary discharge planning process, led by the attending physician.  Recommendations may be updated based on patient status, additional functional criteria and insurance authorization.    Follow Up Recommendations  Skilled nursing-short term rehab (<3 hours/day)    Assistance Recommended at Discharge Frequent or constant Supervision/Assistance  Patient can return home with the following  Two people to help with walking and/or transfers;Two people to help with bathing/dressing/bathroom   Equipment Recommendations  Hospital bed    Recommendations for Other Services      Precautions / Restrictions Precautions Precautions: Fall Restrictions Weight Bearing Restrictions: No       Mobility Bed  Mobility Overal bed mobility: Needs Assistance Bed Mobility: Supine to Sit, Sit to Supine     Supine to sit: +2 for physical assistance, Mod assist Sit to supine: +2 for physical assistance, Max assist   General bed mobility comments: Assist to move legs off of bed, elevate trunk into sitting and bring hips to EOB. Assist to lower trunk and bring legs back up into bed    Transfers Overall transfer level: Needs assistance Equipment used: Rolling walker (2 wheels), Ambulation equipment used Transfers: Sit to/from Stand, Bed to chair/wheelchair/BSC Sit to Stand: +2 physical assistance, Mod assist           General transfer comment: +2 mod assist to rise from bed with walker and to rise from bed or bsc with Stedy. Coming to stand from elevated seat on Stedy pt only requires min assist. Used Stedy for bed to bsc to bed. Transfer via Lift Equipment: Stedy   Balance Overall balance assessment: Needs assistance Sitting-balance support: No upper extremity supported, Feet supported Sitting balance-Leahy Scale: Fair     Standing balance support: Bilateral upper extremity supported, During functional activity Standing balance-Leahy Scale: Poor                             ADL either performed or assessed with clinical judgement   ADL Overall ADL's : Needs assistance/impaired                     Lower Body Dressing: Total assistance Lower Body Dressing Details (indicate cue type and reason): to don socks sitting EOB; difficulty reaching Toilet Transfer: BSC/3in1;Requires wide/bariatric;Moderate assistance;+2 for physical  assistance;+2 for safety/equipment Toilet Transfer Details (indicate cue type and reason): use of Stedy to transfer to [facility] (Mod A x 2 to stand from bed and [facility]) Toileting- Clothing Manipulation and Hygiene: Total assistance;+2 for physical assistance;+2 for safety/equipment;Sit to/from stand Toileting - Clothing Manipulation Details (indicate cue type  and reason): Total A for peri care standing in [place]       General ADL Comments: Much more alert with progression of BSC transfers via [facility] (unable to safely take steps with RW today)    Extremity/Trunk Assessment Upper Extremity Assessment Upper Extremity Assessment: Overall WFL for tasks assessed   Lower Extremity Assessment Lower Extremity Assessment: Defer to PT evaluation        Vision   Vision Assessment?: No apparent visual deficits   Perception     Praxis      Cognition Arousal/Alertness: Awake/alert Behavior During Therapy: WFL for tasks assessed/performed Overall Cognitive Status: Impaired/Different from baseline Area of Impairment: Problem solving, Following commands, Awareness, Safety/judgement                       Following Commands: Follows one step commands with increased time, Follows one step commands consistently Safety/Judgement: Decreased awareness of deficits Awareness: Emergent Problem Solving: Slow processing, Decreased initiation, Requires verbal cues General Comments: Pt also HOH so may affect incr time to process. pleasant, follows commands consistently. some decreased insight into deficits as pt wanted to walk into bathroom        Exercises      Shoulder Instructions       General Comments VSS on RA. Wife present and reports "I thought therapy dropped him off of their list" - educ on pt alertness and inability to participate when in ICU. educated on likely frequency of therapy at hospital as wife asked if therapy would be back today or tomorrow    Pertinent Vitals/ Pain       Pain Assessment Pain Assessment: No/denies pain Pain Descriptors / Indicators: Sore  Home Living                                          Prior Functioning/Environment              Frequency  Min 2X/week        Progress Toward Goals  OT Goals(current goals can now be found in the care plan section)  Progress towards OT  goals: Progressing toward goals  Acute Rehab OT Goals Patient Stated Goal: to get to toilet OT Goal Formulation: With patient Time For Goal Achievement: 11/09/21 Potential to Achieve Goals: Good ADL Goals Pt Will Perform Lower Body Bathing: with mod assist;sit to/from stand Pt Will Transfer to Toilet: with mod assist;with +2 assist;stand pivot transfer;bedside commode Additional ADL Goal #1: Pt to complete  bed mobility at Mod A in prep for ADL transfers Additional ADL Goal #2: Pt to demonstrate ability to sit EOB > 5 min with no more than Min A to maintain balance during functional tasks  Plan Discharge plan remains appropriate    Co-evaluation    PT/OT/SLP Co-Evaluation/Treatment: Yes Reason for Co-Treatment: For patient/therapist safety;To address functional/ADL transfers PT goals addressed during session: Mobility/safety with mobility;Proper use of DME OT goals addressed during session: ADL's and self-care      AM-PAC OT "6 Clicks" Daily Activity     Outcome Measure   Help from  another person eating meals?: Total (NPO) Help from another person taking care of personal grooming?: A Little Help from another person toileting, which includes using toliet, bedpan, or urinal?: Total Help from another person bathing (including washing, rinsing, drying)?: A Lot Help from another person to put on and taking off regular upper body clothing?: A Lot Help from another person to put on and taking off regular lower body clothing?: Total 6 Click Score: 10    End of Session Equipment Utilized During Treatment: Gait belt  OT Visit Diagnosis: Unsteadiness on feet (R26.81);Other abnormalities of gait and mobility (R26.89);Muscle weakness (generalized) (M62.81)   Activity Tolerance Patient tolerated treatment well   Patient Left in bed;with call bell/phone within reach;with bed alarm set;with family/visitor present   Nurse Communication Mobility status        Time: 4599-7741 OT Time  Calculation (min): 32 min  Charges: OT General Charges $OT Visit: 1 Visit OT Treatments $Self Care/Home Management : 8-22 mins  Malachy Chamber, OTR/L Acute Rehab Services Office: [phone]   Layla Maw 10/28/2021, 12:27 PM

## 2021-10-28 NOTE — Progress Notes (Signed)
°  Echocardiogram Echocardiogram Transesophageal has been performed.  [name] Young 10/28/2021, 2:13 PM

## 2021-10-28 NOTE — TOC Benefit Eligibility Note (Signed)
Patient Teacher, English as a foreign language completed.    The patient is currently admitted and upon discharge could be taking Eliquis 5 mg.  The current 30 day co-pay is, $40.00.   The patient is currently admitted and upon discharge could be taking Xarelto 20 mg.  The current 30 day co-pay is, $40.00.   The patient is currently admitted and upon discharge could be taking Warfarin 5 mg.  The current 30 day co-pay is, $9.24.   The patient is insured through [place], [place] Patient Advocate Specialist [place] Patient Advocate Team Direct Number: [phone]  Fax: [phone]

## 2021-10-28 NOTE — Progress Notes (Signed)
Progress Note  Patient Name: [name] Young. Date of Encounter: 10/28/2021  Primary Cardiologist: Nelva Bush, MD   Subjective   Patient see and examined at his bedside.  He was awake and alert at the time of my encounter.    Inpatient Medications    Scheduled Meds:  [MAR Hold] chlorhexidine  15 mL Mouth Rinse BID   [MAR Hold] Chlorhexidine Gluconate Cloth  6 each Topical Q0600   [MAR Hold] enoxaparin (LOVENOX) injection  1 mg/kg Subcutaneous Q12H   [MAR Hold] insulin aspart  0-15 Units Subcutaneous TID WC   [MAR Hold] lactulose  30 g Oral Daily   [MAR Hold] mouth rinse  15 mL Mouth Rinse q12n4p   [MAR Hold] metoprolol tartrate  25 mg Oral BID   [MAR Hold] tamsulosin  0.4 mg Oral Daily   Continuous Infusions:  sodium chloride 500 mL (10/28/21 1236)   [MAR Hold] ampicillin-sulbactam (UNASYN) IV 3 g (10/28/21 0857)   PRN Meds: [MAR Hold] docusate sodium, [MAR Hold] hydrALAZINE, [MAR Hold] melatonin, [MAR Hold] ondansetron (ZOFRAN) IV, [MAR Hold] polyethylene glycol   Vital Signs    Vitals:   10/28/21 0334 10/28/21 0744 10/28/21 0854 10/28/21 1225  BP: (!) 153/72 (!) 155/78  (!) 169/70  Pulse: 72 79 87   Resp: (!) 24 17  (!) 24  Temp: 98.7 F (37.1 C) 98.9 F (37.2 C)    TempSrc: Axillary Oral    SpO2: 92% 94%  94%  Weight: 120 kg     Height:        Intake/Output Summary (Last 24 hours) at 10/28/2021 1254 Last data filed at 10/28/2021 0900 Gross per 24 hour  Intake 120 ml  Output 1200 ml  Net -1080 ml   Filed Weights   10/26/21 0408 10/27/21 0130 10/28/21 0334  Weight: 119.2 kg 118.5 kg 120 kg    Telemetry    Atrial fibrillation with controlled ventricular rate- Personally Reviewed  ECG    None - Personally Reviewed  Physical Exam    General: Not in acute distress,  Head: Atraumatic, normal size  Eyes: PEERLA, EOMI  Neck: Supple, normal JVD Cardiac: Normal S1, S2; RRR; no murmurs, rubs, or gallops Lungs: Clear to auscultation  bilaterally Abd: Soft, nontender, no hepatomegaly  Ext: warm, no edema Musculoskeletal: No deformities, BUE and BLE strength normal and equal Skin: Warm and dry, no rashes   Neuro: Awake alert and oriented x3  Labs    Chemistry Recent Labs  Lab 10/24/21 0411 10/24/21 2226 10/25/21 0029 10/26/21 0112 10/27/21 0852 10/28/21 0317  NA 138   < > 140 141 144 142  K 3.7   < > 4.4 3.6 3.1* 3.2*  CL 99  --  102 106 109 108  CO2 23  --  21* 19* 21* 24  GLUCOSE 374*  --  96 114* 139* 157*  BUN 37*  --  57* 71* 61* 49*  CREATININE 3.19*  --  4.42* 3.65* 1.90* 1.51*  CALCIUM 7.1*  --  8.4* 7.8* 8.0* 7.9*  PROT 4.8*  --  5.6* 5.5*  --   --   ALBUMIN 2.3*  --  2.6* 2.7* 2.7*  --   AST 1,557*  --  1,104* 357*  --   --   ALT 849*  --  905* 515*  --   --   ALKPHOS 106  --  150* 141*  --   --   BILITOT 3.7*  --  3.5* 1.9*  --   --  GFRNONAA 19*  --  13* 16* 35* 47*  ANIONGAP 16*  --  17* 16* 14 10   < > = values in this interval not displayed.     Hematology Recent Labs  Lab 10/26/21 0112 10/27/21 0852 10/28/21 0317  WBC 12.0* 9.6 11.0*  RBC 3.51* 3.71* 3.76*  HGB 10.2* 10.8* 11.0*  HCT 31.2* 31.7* 32.0*  MCV 88.9 85.4 85.1  MCH 29.1 29.1 29.3  MCHC 32.7 34.1 34.4  RDW 15.9* 16.1* 16.5*  PLT 72* 70* 92*    Cardiac EnzymesNo results for input(s): TROPONINI in the last 168 hours. No results for input(s): TROPIPOC in the last 168 hours.   BNPNo results for input(s): BNP, PROBNP in the last 168 hours.   DDimer No results for input(s): DDIMER in the last 168 hours.   Radiology    DG Chest Port 1 View  Result Date: 10/28/2021 CLINICAL DATA:  Shortness of breath EXAM: PORTABLE CHEST 1 VIEW COMPARISON:  Radiograph 10/23/2021 FINDINGS: Unchanged enlarged cardiac silhouette. Prior median sternotomy and CABG. There are left basilar opacities. Small bilateral pleural effusions, left greater than right. No visible pneumothorax. No acute osseous abnormality. IMPRESSION: Small  bilateral pleural effusions, left greater than right. Left basilar opacities could be atelectasis or infection. Unchanged cardiomegaly. Electronically Signed   By: Maurine Simmering M.D.   On: 10/28/2021 11:58    Cardiac Studies   TTE 10/23/2021 IMPRESSIONS     1. Left ventricular ejection fraction, by estimation, is 40 to 45%. The  left ventricle has mildly decreased function. The left ventricle  demonstrates global hypokinesis. Left ventricular diastolic parameters are  consistent with Grade II diastolic  dysfunction (pseudonormalization). Elevated left ventricular end-diastolic  pressure.   2. Right ventricular systolic function is normal. The right ventricular  size is normal. There is normal pulmonary artery systolic pressure. The  estimated right ventricular systolic pressure is 24.0 mmHg.   3. Left atrial size was mildly dilated.   4. The mitral valve is degenerative. Trivial mitral valve regurgitation.  No evidence of mitral stenosis.   5. The aortic valve is calcified. There is moderate calcification of the  aortic valve. There is moderate thickening of the aortic valve. Aortic  valve regurgitation is not visualized. Aortic valve  sclerosis/calcification is present, without any evidence  of aortic stenosis.   6. There is mild dilatation of the aortic root, measuring 41 mm. There is  mild dilatation of the ascending aorta, measuring 38 mm.   7. The inferior vena cava is dilated in size with >50% respiratory  variability, suggesting right atrial pressure of 8 mmHg.   FINDINGS   Left Ventricle: Left ventricular ejection fraction, by estimation, is 40  to 45%. The left ventricle has mildly decreased function. The left  ventricle demonstrates global hypokinesis. The left ventricular internal  cavity size was normal in size. There is   no left ventricular hypertrophy. Left ventricular diastolic parameters  are consistent with Grade II diastolic dysfunction (pseudonormalization).   Elevated left ventricular end-diastolic pressure.   Right Ventricle: The right ventricular size is normal. No increase in  right ventricular wall thickness. Right ventricular systolic function is  normal. There is normal pulmonary artery systolic pressure. The tricuspid  regurgitant velocity is 2.02 m/s, and   with an assumed right atrial pressure of 8 mmHg, the estimated right  ventricular systolic pressure is 97.3 mmHg.   Left Atrium: Left atrial size was mildly dilated.   Right Atrium: Right atrial size was normal  in size.   Pericardium: There is no evidence of pericardial effusion.   Mitral Valve: The mitral valve is degenerative in appearance. There is  moderate thickening of the mitral valve leaflet(s). There is moderate  calcification of the mitral valve leaflet(s). Mild mitral annular  calcification. Trivial mitral valve  regurgitation. No evidence of mitral valve stenosis.   Tricuspid Valve: The tricuspid valve is normal in structure. Tricuspid  valve regurgitation is trivial. No evidence of tricuspid stenosis.   Aortic Valve: The aortic valve is calcified. There is moderate  calcification of the aortic valve. There is moderate thickening of the  aortic valve. Aortic valve regurgitation is not visualized. Aortic valve  sclerosis/calcification is present, without any   evidence of aortic stenosis.   Pulmonic Valve: The pulmonic valve was normal in structure. Pulmonic valve  regurgitation is trivial. No evidence of pulmonic stenosis.   Aorta: The aortic root is normal in size and structure. There is mild  dilatation of the aortic root, measuring 41 mm. There is mild dilatation  of the ascending aorta, measuring 38 mm.   Venous: The inferior vena cava is dilated in size with greater than 50%  respiratory variability, suggesting right atrial pressure of 8 mmHg.   IAS/Shunts: No atrial level shunt detected by color flow Doppler.       Patient Profile     81 y.o.  male hx of 3v CAD with CABG 2019, with post op a fib, HTN, HLD, DM-2, CKD 3, now admitted with abd pain due to biliary tract obstruction and ascending cholangitis undergoing ERCP removing 2 stones, sepsis and a fib who is being seen 10/24/2021 for the evaluation of atrial fib and new CM with EF 40-45%.  Assessment & Plan   Atrial fibrillation -now tolerating p.o. restart his Lopressor.  Continue he is on heparin drip.  CAD-slightly elevated troponin in the setting of his infection.  Will reevaluate for need of ischemic evaluation once acute issues have resolved.  For now continue with medical therapy aspirin 81 mg daily, Crestor.  Depressed ejection fraction-does not appear to be volume overloaded.  Cautious diuretic as clinically indicated.  Back on beta-blocker will eventually transition to Toprol-XL.  Plan to add Entresto soon.  Sepsis secondary to E. coli and strep to coccus species bacteremia in the setting of acute ascending cholangitis now status post ERCP-ID following now on Unasyn.  Plan for TEE today.  If no vegetations I discussed with the patient we will proceed with cardioversion if no left atrial appendix clots.  Shared Decision Making/Informed Consent The risks [stroke, cardiac arrhythmias rarely resulting in the need for a temporary or permanent pacemaker, skin irritation or burns, esophageal damage, perforation (1:10,000 risk), bleeding, pharyngeal hematoma as well as other potential complications associated with conscious sedation including aspiration, arrhythmia, respiratory failure and death], benefits (treatment guidance, restoration of normal sinus rhythm, diagnostic support) and alternatives of a transesophageal echocardiogram guided cardioversion were discussed in detail with Mr. Rosana Berger and he is willing to proceed.   His wife is agreeable as well for this procedure.  She was at the bedside.  Hypertension-blood pressure is elevated but he is just restarted his metoprolol we  will monitor and make adjustments as appropriate.  Acute metabolic encephalopathy- resolved  Type 2 diabetes by the hospitalist  Of note the patient wife who also had been in the room every day this week, I was able to discuss with her she is agreeable with the plan as described above TEE, no  vegetation proceed with cardioversion.  For questions or updates, please contact [place] Please consult [email or web address] for contact info under Cardiology/STEMI.

## 2021-10-28 NOTE — CV Procedure (Addendum)
TRANSESOPHAGEAL ECHOCARDIOGRAM (TEE) NOTE  INDICATIONS: infective endocarditis  PROCEDURE:   Informed consent was obtained prior to the procedure. The risks, benefits and alternatives for the procedure were discussed and the patient comprehended these risks.  Risks include, but are not limited to, cough, sore throat, vomiting, nausea, somnolence, esophageal and stomach trauma or perforation, bleeding, low blood pressure, aspiration, pneumonia, infection, trauma to the teeth and death.    After a procedural time-out, the patient was given propofol per anesthesia for sedation.  The patient's heart rate, blood pressure, and oxygen saturation are monitored continuously during the procedure.The oropharynx was anesthetized with topical cetacaine.  The transesophageal probe was inserted in the esophagus and stomach without difficulty and multiple views were obtained.  The patient was kept under observation until the patient left the procedure room.  I was present face-to-face 100% of this time. The patient left the procedure room in stable condition.   Agitated microbubble saline contrast was not administered.  COMPLICATIONS:    There were no immediate complications.  Findings:  LEFT VENTRICLE: The left ventricular wall thickness is normal.  The left ventricular cavity is normal in size. Wall motion is globally hypokinetic.  LVEF is 40-45%.  RIGHT VENTRICLE:  The right ventricle is normal in structure and function without any thrombus or masses.    LEFT ATRIUM:  The left atrium is mildly dilated in size without any thrombus or masses.  There is not spontaneous echo contrast ("smoke") in the left atrium consistent with a low flow state.  LEFT ATRIAL APPENDAGE:  The left atrial appendage is free of any thrombus or masses. The appendage has single lobes. Pulse doppler indicates moderate flow in the appendage.  ATRIAL SEPTUM:  The atrial septum appears intact and is free of thrombus and/or  masses.  There is no evidence for interatrial shunting by color doppler.  RIGHT ATRIUM:  The right atrium is normal in size and function without any thrombus or masses.  MITRAL VALVE:  The mitral valve is abnormal with thickening and increased echogenicity suggestive of endocarditis, particularly of the anterior leaflet. There is a small, mobile subvalvular structure that is also consistent with vegetation.  There is Mild regurgitation.  There is no stenosis.  AORTIC VALVE:  The aortic valve leaflets are thickened. Lambl's excrescences are seen on the leaflet tips, but no definitive vegetetation. There is  no  regurgitation.  There is no stenosis  TRICUSPID VALVE:  The tricuspid valve is normal in structure and function with  trivial  regurgitation.  There were no vegetations or stenosis   PULMONIC VALVE:  The pulmonic valve is normal in structure and function with  no  regurgitation.  There were no vegetations or stenosis.   AORTIC ARCH, ASCENDING AND DESCENDING AORTA:  There was grade 1 Ron Parker et. Al, 1992) atherosclerosis of the ascending aorta, aortic arch, or proximal descending aorta.  12. PULMONARY VEINS: Anomalous pulmonary venous return was not noted.  13. PERICARDIUM: The pericardium appeared normal and non-thickened.  There is no pericardial effusion.  IMPRESSION:   Suspected endocarditis of the mitral valve with increased echogenicty, thickening and hockystick motion as well as a small mobile vegetation. There is mild MR. Findings are new compared to TEE in 2019 which was personally reviewed. Dilated ascending aorta to 38 mm. Thickening of the aortic valve leaflets without mobile vegetation. LVEF 40-45%, global hypokinesis. Pre-op EKG demonstrated the patient was back in sinus rhythm with PVC's, therefore, cardioversion was not pursued.  RECOMMENDATIONS:  Recommend antiobiotic therapy for mitral valve endocarditis as per ID recommendations.  Time Spent Directly with the  Patient:  45 minutes   [name] Casino, MD, [facility] [place], [place] Director of the [facility] Lipid Disorders &  Cardiovascular [facility] Reduction Clinic Diplomate of the American Board of Clinical Lipidology Attending Cardiologist  Direct Dial: [phone]   Fax: [phone]  Website:  [name] 10/28/2021, 2:06 PM

## 2021-10-28 NOTE — Progress Notes (Signed)
[facility] NEPHROLOGY PROGRESS NOTE  Assessment/ Plan: Pt is a 81 y.o. yo male  with history of HTN, HLD, DM, CAD status post CABG, A-fib, CKD with baseline creatinine level seems to be around 1.3-1.6, with cholangitis, E. coli and streptococcal bacteremia consulted for AKI on CKD.  # Acute kidney injury on CKD stage III likely  ATN in the setting of sepsis/bacteremia concomitant with the use of IV contrast and liver disease.  Has improved with conservative care.  Creatinine at his baseline.  We will sign off    #Sepsis due to E. coli and Streptococcus bacteremia: Currently on Unasyn per ID and plan for TEE noted.   #Ascending colitis status post ERCP and removal of stone and sphincterotomy: Seen by GI, trend liver enzyme.   #Acute metabolic encephalopathy probably due to Ativan, liver disease.  Recommend to avoid binge use or sedatives.  Mental status significantly improved.  #A-fib with RVR: Management per primary  #Metabolic acidosis: Resolved.  Stop oral bicarbonate  Subjective: Patient feels well today with no complaints.  Creatinine and urine output adequate   Objective Vital signs in last 24 hours: Vitals:   10/27/21 2310 10/28/21 0334 10/28/21 0744 10/28/21 0854  BP: 135/68 (!) 153/72 (!) 155/78   Pulse: 62 72 79 87  Resp: (!) 24 (!) 24 17   Temp: 98.7 F (37.1 C) 98.7 F (37.1 C) 98.9 F (37.2 C)   TempSrc: Oral Axillary Oral   SpO2: 94% 92% 94%   Weight:  120 kg    Height:       Weight change: 1.5 kg  Intake/Output Summary (Last 24 hours) at 10/28/2021 1120 Last data filed at 10/28/2021 0900 Gross per 24 hour  Intake 120 ml  Output 1200 ml  Net -1080 ml       Labs: Basic Metabolic Panel: Recent Labs  Lab 10/24/21 0411 10/24/21 2226 10/26/21 0112 10/27/21 0852 10/28/21 0317  NA 138   < > 141 144 142  K 3.7   < > 3.6 3.1* 3.2*  CL 99   < > 106 109 108  CO2 23   < > 19* 21* 24  GLUCOSE 374*   < > 114* 139* 157*  BUN 37*   < > 71* 61*  49*  CREATININE 3.19*   < > 3.65* 1.90* 1.51*  CALCIUM 7.1*   < > 7.8* 8.0* 7.9*  PHOS 5.8*  --   --  2.8  --    < > = values in this interval not displayed.   Liver Function Tests: Recent Labs  Lab 10/24/21 0411 10/25/21 0029 10/26/21 0112 10/27/21 0852  AST 1,557* 1,104* 357*  --   ALT 849* 905* 515*  --   ALKPHOS 106 150* 141*  --   BILITOT 3.7* 3.5* 1.9*  --   PROT 4.8* 5.6* 5.5*  --   ALBUMIN 2.3* 2.6* 2.7* 2.7*   Recent Labs  Lab 10/23/21 0334 10/24/21 0411  LIPASE 390* 109*   Recent Labs  Lab 10/23/21 0401 10/26/21 0811  AMMONIA 57* 26   CBC: Recent Labs  Lab 10/23/21 0334 10/23/21 0431 10/23/21 2154 10/24/21 0411 10/24/21 2226 10/25/21 0029 10/26/21 0112 10/27/21 0852 10/28/21 0317  WBC 7.9   < > 16.2* 11.4*  --  9.3 12.0* 9.6 11.0*  NEUTROABS 7.4  --  14.4*  --   --  8.4*  --   --   --   HGB 12.9*   < > 9.5* 9.4*   < >  11.1* 10.2* 10.8* 11.0*  HCT 40.6   < > 30.6* 30.2*   < > 33.7* 31.2* 31.7* 32.0*  MCV 93.8   < > 95.3 92.4  --  89.2 88.9 85.4 85.1  PLT 193   < > 96* 75*  --  63* 72* 70* 92*   < > = values in this interval not displayed.   Cardiac Enzymes: No results for input(s): CKTOTAL, CKMB, CKMBINDEX, TROPONINI in the last 168 hours. CBG: Recent Labs  Lab 10/27/21 1134 10/27/21 1535 10/27/21 1850 10/27/21 1953 10/28/21 0746  GLUCAP 148* 217* 177* 164* 141*    Iron Studies: No results for input(s): IRON, TIBC, TRANSFERRIN, FERRITIN in the last 72 hours. Studies/Results: No results found.  Medications: Infusions:  sodium chloride 20 mL/hr at 10/28/21 0610   ampicillin-sulbactam (UNASYN) IV 3 g (10/28/21 0857)    Scheduled Medications:  chlorhexidine  15 mL Mouth Rinse BID   Chlorhexidine Gluconate Cloth  6 each Topical Q0600   enoxaparin (LOVENOX) injection  1 mg/kg Subcutaneous Q12H   insulin aspart  0-15 Units Subcutaneous TID WC   lactulose  30 g Oral Daily   mouth rinse  15 mL Mouth Rinse q12n4p   metoprolol tartrate   25 mg Oral BID   sodium bicarbonate  650 mg Oral BID   tamsulosin  0.4 mg Oral Daily    have reviewed scheduled and prn medications.  Physical Exam: General: Alert awake, not in distress Heart:RRR, s1s2 nl Lungs:clear b/l, no crackle Abdomen:soft, Non-tender, non-distended Extremities:No peripheral edema. Neurology: Alert, awake and following commands  Reesa Chew 10/28/2021,11:20 AM  LOS: 5 days

## 2021-10-28 NOTE — Progress Notes (Signed)
Physical Therapy Treatment Patient Details Name: [name] Young. MRN: [id number] DOB: 05/22/1941 Today's Date: 10/28/2021   History of Present Illness 81 y.o. male presented to the ED 10/23/21 with complaints of abdominal pain with associated nausea and vomiting that began the day before. In ED pt found to be in A-fib RVR, hypertensive, and tachypnea. CTA chest/abd/pelvis reveled dilated bile duct, acute cholelithiasis without cholecystis found to have cholangitis status post ERCP with gastroenterology on 10/23/2021.  His blood cultures on admission are positive for E. coli and Streptococcus species.  PMH significant for CAD s/p CAGB 2019 A-fib,, HTN. HLD, diabetes, GERD, and CKD    PT Comments    Pt much more alert and able to participate. Wife needs pt to be able to walk from car into house. Currently pt is requiring 2 person assist to stand. Will keep recommendation to SNF at this time but if pt progresses to amb level may be able look at [facility].    Recommendations for follow up therapy are one component of a multi-disciplinary discharge planning process, led by the attending physician.  Recommendations may be updated based on patient status, additional functional criteria and insurance authorization.  Follow Up Recommendations  Skilled nursing-short term rehab (<3 hours/day)     Assistance Recommended at Discharge Frequent or constant Supervision/Assistance  Patient can return home with the following Two people to help with walking and/or transfers;Help with stairs or ramp for entrance   Equipment Recommendations  Wheelchair (measurements PT)    Recommendations for Other Services       Precautions / Restrictions Precautions Precautions: Fall Restrictions Weight Bearing Restrictions: No     Mobility  Bed Mobility Overal bed mobility: Needs Assistance Bed Mobility: Supine to Sit, Sit to Supine     Supine to sit: +2 for physical assistance, Mod assist Sit to supine: +2 for  physical assistance, Max assist   General bed mobility comments: Assist to move legs off of bed, elevate trunk into sitting and bring hips to EOB. Assist to lower trunk and bring legs back up into bed    Transfers Overall transfer level: Needs assistance Equipment used: Rolling walker (2 wheels), Ambulation equipment used Transfers: Sit to/from Stand, Bed to chair/wheelchair/BSC Sit to Stand: +2 physical assistance, Min assist, Mod assist           General transfer comment: +2 mod assist to rise from bed with walker and to rise from bed or bsc with Stedy. Coming to stand from elevated seat on Stedy pt only requires min assist. Used Stedy for bed to bsc to bed. Transfer via Lift Equipment: Stedy  Ambulation/Gait             Pre-gait activities: Stood x 5 with walker and Stedy. Unable to achieve enough stability to attempt ambulation     Stairs             Wheelchair Mobility    Modified Rankin (Stroke Patients Only)       Balance Overall balance assessment: Needs assistance Sitting-balance support: No upper extremity supported, Feet supported Sitting balance-Leahy Scale: Fair                                      Cognition Arousal/Alertness: Awake/alert Behavior During Therapy: WFL for tasks assessed/performed Overall Cognitive Status: Impaired/Different from baseline Area of Impairment: Problem solving, Following commands, Awareness, Safety/judgement  Following Commands: Follows one step commands with increased time Safety/Judgement: Decreased awareness of deficits Awareness: Emergent Problem Solving: Slow processing, Decreased initiation, Requires verbal cues General Comments: Pt also HOH so may affect incr time to process        Exercises      General Comments General comments (skin integrity, edema, etc.): VSS on RA      Pertinent Vitals/Pain Pain Assessment Pain Assessment: No/denies pain Pain  Descriptors / Indicators: Sore    Home Living                          Prior Function            PT Goals (current goals can now be found in the care plan section) Progress towards PT goals: Progressing toward goals    Frequency    Min 3X/week      PT Plan Current plan remains appropriate    Co-evaluation PT/OT/SLP Co-Evaluation/Treatment: Yes Reason for Co-Treatment: For patient/therapist safety PT goals addressed during session: Mobility/safety with mobility;Proper use of DME        AM-PAC PT "6 Clicks" Mobility   Outcome Measure  Help needed turning from your back to your side while in a flat bed without using bedrails?: Total Help needed moving from lying on your back to sitting on the side of a flat bed without using bedrails?: Total Help needed moving to and from a bed to a chair (including a wheelchair)?: Total Help needed standing up from a chair using your arms (e.g., wheelchair or bedside chair)?: Total Help needed to walk in hospital room?: Total Help needed climbing 3-5 steps with a railing? : Total 6 Click Score: 6    End of Session Equipment Utilized During Treatment: Gait belt Activity Tolerance: Patient tolerated treatment well Patient left: in bed;with call bell/phone within reach;with bed alarm set;with family/visitor present Nurse Communication: Mobility status;Need for lift equipment PT Visit Diagnosis: Muscle weakness (generalized) (M62.81);Difficulty in walking, not elsewhere classified (R26.2)     Time: 3276-1470 PT Time Calculation (min) (ACUTE ONLY): 31 min  Charges:  $Therapeutic Activity: 8-22 mins                     [place] Pager [phone] Office [place] 10/28/2021, 12:18 PM

## 2021-10-28 NOTE — Interval H&P Note (Signed)
History and Physical Interval Note:  10/28/2021 1:32 PM  [name] Young.  has presented today for surgery, with the diagnosis of BACTEREMIA.  The various methods of treatment have been discussed with the patient and family. After consideration of risks, benefits and other options for treatment, the patient has consented to  Procedure(s): TRANSESOPHAGEAL ECHOCARDIOGRAM (TEE) (N/A) as a surgical intervention.  The patient's history has been reviewed, patient examined, no change in status, stable for surgery.  I have reviewed the patient's chart and labs.  Questions were answered to the patient's satisfaction.     Pixie Casino

## 2021-10-28 NOTE — Anesthesia Postprocedure Evaluation (Signed)
Anesthesia Post Note  Patient: [name] Young.  Procedure(s) Performed: TRANSESOPHAGEAL ECHOCARDIOGRAM (TEE)     Patient location during evaluation: Endoscopy Anesthesia Type: MAC Level of consciousness: awake Pain management: pain level controlled Vital Signs Assessment: post-procedure vital signs reviewed and stable Respiratory status: spontaneous breathing Cardiovascular status: stable Postop Assessment: no apparent nausea or vomiting Anesthetic complications: no   No notable events documented.  Last Vitals:  Vitals:   10/28/21 1440 10/28/21 1504  BP: (!) 156/62 129/78  Pulse: 76 76  Resp: (!) 25 (!) 21  Temp: 37.4 C   SpO2: 97% 95%    Last Pain:  Vitals:   10/28/21 1440  TempSrc:   PainSc: 0-No pain                 Rajean Desantiago

## 2021-10-28 NOTE — Progress Notes (Signed)
PROGRESS NOTE                                                                                                                                                                                                             Patient Demographics:    [name] Young, is a 81 y.o. male, DOB - [birth year], JHE:[id number]  Outpatient Primary MD for the patient is Ria Bush, MD    LOS - 5  Admit date - 10/23/2021    Chief Complaint  Patient presents with   Abdominal Pain       Brief Narrative (HPI from H&P)     81 year old gentleman with a history of coronary artery disease status post CABG 2019, paroxysmal atrial fibrillation, diabetes, GERD, CKD who presented with abdominal pain, found to have ascending cholangitis and bacteremia, he was kept in ICU was seen by GI underwent ERCP with gallstone extraction, he was also seen by ID for bacteremia.  He was transferred to my service on 10/28/2021 on day 5 of his hospital admission   Subjective:    [name] today has, No headache, No chest pain, No abdominal pain - No Nausea, No new weakness tingling or numbness, No SOB.   Assessment  & Plan :   Sepsis and bacteremia with Streptococcus infantarius and E. Coli - caused by ascending cholangitis, had gallstones was seen by GI and he underwent ERCP with 2 gallbladder stones removed with sphincterotomy on 10/23/2021, seen by ID currently on IV antibiotics, TTE stable undergoing TEE to rule out vegetation.  Will defer antibiotic course and duration to ID.  Clinically marginally improved with sepsis pathophysiology resolved.  2.  AKI on CKD 3B.  Seen by nephrology, AKI due to ATN from sepsis.  Improved after hydration and hemodynamic stability.  Creatinine peaked at 4.5 now down to 1.5.  3.   Elevated LFTs due to #1 coming down.  4.  Acute drop in systolic function of heart with EF dropping to 45% down from 55% few years ago.  Likely  due to sepsis.  Currently on beta-blocker, seen by cardiology.  Avoiding ACE/ARB due to renal failure.  Compensated from this fluid standpoint.  Outpatient cardiology follow-up post discharge.  5.  Paroxysmal atrial fibrillation with [place] vas 2 score of greater than 3.  On combination of Lopressor and full dose Lovenox for  now.  6.  Severe of weakness and deconditioning.  PT OT and monitor.  May require SNF.  7.  Hypokalemia.  Replaced.  8.  BPH.  On Flomax continue.  9.  DM type II.  For now sliding scale.  A1c satisfactory at 6.1  CBG (last 3)  Recent Labs    10/27/21 1850 10/27/21 1953 10/28/21 0746  GLUCAP 177* 164* 141*         Condition - Extremely Guarded  Family Communication  :  wife bedside 10/28/21  Code Status :  Full  Consults  :  PCCM, ID, Cards  PUD Prophylaxis :    Procedures  :     CT Head - non acute  ERCP with removal of 2 gallbladder stones and sphincterotomy on 10/23/2021.    CT Abd & Pelvis - Chest -  1. No evidence of acute aortic syndrome. 2. No acute findings in the thorax to account for the patient's symptoms. 3. 5 mm calculus at the right ureteropelvic junction. At this time, there is no proximal hydronephrosis to indicate urinary tract obstruction. 4. Cholelithiasis without evidence of acute cholecystitis. 5. Dilatation of the common bile duct. No intrahepatic biliary ductal dilatation to clearly indicate biliary tract obstruction. Additionally, there is no calcified choledocholithiasis. This is of uncertain etiology and significance, and could be age related, however, correlation with liver function tests is recommended. If there is clinical concern for biliary tract obstruction, further evaluation with abdominal MRI with and without IV gadolinium with MRCP would be recommended. 6. Aortic atherosclerosis, in addition to left main and three-vessel coronary artery disease. Status post median sternotomy for CABG including LIMA to the LAD. 7.  There are calcifications of the aortic valve. Echocardiographic correlation for evaluation of potential valvular dysfunction may be warranted if clinically indicated. 8. Additional incidental findings, as above.  TTE -  EF 45% with global hypokinesis  TEE      Disposition Plan  :    Status is: Inpatient  DVT Prophylaxis  :  Lovenox  SCDs Start: 10/23/21 0909  Lab Results  Component Value Date   PLT 92 (L) 10/28/2021    Diet :  Diet Order             Diet NPO time specified  Diet effective midnight                    Inpatient Medications  Scheduled Meds:  chlorhexidine  15 mL Mouth Rinse BID   Chlorhexidine Gluconate Cloth  6 each Topical Q0600   enoxaparin (LOVENOX) injection  1 mg/kg Subcutaneous Q12H   insulin aspart  0-15 Units Subcutaneous TID WC   lactulose  30 g Oral Daily   mouth rinse  15 mL Mouth Rinse q12n4p   metoprolol tartrate  25 mg Oral BID   sodium bicarbonate  650 mg Oral BID   tamsulosin  0.4 mg Oral Daily   Continuous Infusions:  sodium chloride 20 mL/hr at 10/28/21 0610   ampicillin-sulbactam (UNASYN) IV 3 g (10/28/21 0857)   PRN Meds:.docusate sodium, hydrALAZINE, melatonin, ondansetron (ZOFRAN) IV, polyethylene glycol  Antibiotics  :    Anti-infectives (From admission, onward)    Start     Dose/Rate Route Frequency Ordered Stop   10/27/21 2149  Ampicillin-Sulbactam (UNASYN) 3 g in sodium chloride 0.9 % 100 mL IVPB        3 g 200 mL/hr over 30 Minutes Intravenous Every 6 hours 10/27/21 1838     10/27/21  1647  Ampicillin-Sulbactam (UNASYN) 3 g in sodium chloride 0.9 % 100 mL IVPB  Status:  Discontinued        3 g 200 mL/hr over 30 Minutes Intravenous Every 8 hours 10/27/21 1018 10/27/21 1838   10/25/21 2200  Ampicillin-Sulbactam (UNASYN) 3 g in sodium chloride 0.9 % 100 mL IVPB  Status:  Discontinued        3 g 200 mL/hr over 30 Minutes Intravenous Every 12 hours 10/25/21 0928 10/27/21 1018   10/25/21 1015  Ampicillin-Sulbactam  (UNASYN) 3 g in sodium chloride 0.9 % 100 mL IVPB  Status:  Discontinued        3 g 200 mL/hr over 30 Minutes Intravenous Every 12 hours 10/25/21 0919 10/25/21 0928   10/23/21 1530  piperacillin-tazobactam (ZOSYN) IVPB 3.375 g  Status:  Discontinued        3.375 g 12.5 mL/hr over 240 Minutes Intravenous Every 8 hours 10/23/21 1359 10/23/21 1420   10/23/21 1515  cefTRIAXone (ROCEPHIN) 2 g in sodium chloride 0.9 % 100 mL IVPB  Status:  Discontinued        2 g 200 mL/hr over 30 Minutes Intravenous Every 24 hours 10/23/21 1420 10/25/21 0919   10/23/21 1515  metroNIDAZOLE (FLAGYL) IVPB 500 mg  Status:  Discontinued        500 mg 100 mL/hr over 60 Minutes Intravenous Every 12 hours 10/23/21 1420 10/25/21 0919   10/23/21 1330  piperacillin-tazobactam (ZOSYN) IVPB 3.375 g  Status:  Discontinued        3.375 g 12.5 mL/hr over 240 Minutes Intravenous Every 8 hours 10/23/21 0714 10/23/21 1359   10/23/21 0715  piperacillin-tazobactam (ZOSYN) IVPB 3.375 g        3.375 g 100 mL/hr over 30 Minutes Intravenous  Once 10/23/21 0714 10/23/21 0741        Time Spent in minutes  30   Lala Lund M.D on 10/28/2021 at 11:18 AM  To page go to [email or web address]   [facility] -  Office  [phone]  See all Orders from today for further details    Objective:   Vitals:   10/27/21 2310 10/28/21 0334 10/28/21 0744 10/28/21 0854  BP: 135/68 (!) 153/72 (!) 155/78   Pulse: 62 72 79 87  Resp: (!) 24 (!) 24 17   Temp: 98.7 F (37.1 C) 98.7 F (37.1 C) 98.9 F (37.2 C)   TempSrc: Oral Axillary Oral   SpO2: 94% 92% 94%   Weight:  120 kg    Height:        Wt Readings from Last 3 Encounters:  10/28/21 120 kg  06/07/21 118 kg  01/28/21 118.8 kg     Intake/Output Summary (Last 24 hours) at 10/28/2021 1118 Last data filed at 10/28/2021 0900 Gross per 24 hour  Intake 120 ml  Output 1200 ml  Net -1080 ml     Physical Exam  Awake Alert x2, No new F.N deficits, appears extremely weak and  deconditioned overall [place].AT,PERRAL Supple Neck, No JVD,   Symmetrical Chest wall movement, Good air movement bilaterally, CTAB RRR,No Gallops,Rubs or new Murmurs,  +ve B.Sounds, Abd Soft, No tenderness,   No Cyanosis, Clubbing or edema      Data Review:    CBC Recent Labs  Lab 10/23/21 0334 10/23/21 0431 10/23/21 2154 10/24/21 0411 10/24/21 2226 10/25/21 0029 10/26/21 0112 10/27/21 0852 10/28/21 0317  WBC 7.9   < > 16.2* 11.4*  --  9.3 12.0* 9.6 11.0*  HGB 12.9*   < >  9.5* 9.4* 10.9* 11.1* 10.2* 10.8* 11.0*  HCT 40.6   < > 30.6* 30.2* 32.0* 33.7* 31.2* 31.7* 32.0*  PLT 193   < > 96* 75*  --  63* 72* 70* 92*  MCV 93.8   < > 95.3 92.4  --  89.2 88.9 85.4 85.1  MCH 29.8   < > 29.6 28.7  --  29.4 29.1 29.1 29.3  MCHC 31.8   < > 31.0 31.1  --  32.9 32.7 34.1 34.4  RDW 14.7   < > 15.7* 15.6*  --  15.8* 15.9* 16.1* 16.5*  LYMPHSABS 0.3*  --  0.7  --   --  0.4*  --   --   --   MONOABS 0.1  --  0.7  --   --  0.4  --   --   --   EOSABS 0.0  --  0.0  --   --  0.0  --   --   --   BASOSABS 0.0  --  0.0  --   --  0.0  --   --   --    < > = values in this interval not displayed.    Electrolytes Recent Labs  Lab 10/23/21 0334 10/23/21 0336 10/23/21 0401 10/23/21 0431 10/23/21 0933 10/23/21 1446 10/23/21 2154 10/24/21 0411 10/24/21 0736 10/24/21 1159 10/24/21 1508 10/24/21 2226 10/25/21 0029 10/26/21 0112 10/26/21 0811 10/27/21 0852 10/28/21 0317  NA 137  --   --    < >  --   --  136 138  --   --   --  137 140 141  --  144 142  K 4.9  --   --    < >  --   --  4.1 3.7  --   --   --  4.2 4.4 3.6  --  3.1* 3.2*  CL 106  --   --   --   --   --  105 99  --   --   --   --  102 106  --  109 108  CO2 13*  --   --   --   --   --  14* 23  --   --   --   --  21* 19*  --  21* 24  GLUCOSE 219*  --   --   --   --   --  108* 374*  --   --   --   --  96 114*  --  139* 157*  BUN 24*  --   --   --   --   --  34* 37*  --   --   --   --  57* 71*  --  61* 49*  CREATININE 1.91*  --   --    --  2.11*  --  2.67* 3.19*  --   --   --   --  4.42* 3.65*  --  1.90* 1.51*  CALCIUM 9.1  --   --   --   --   --  7.6* 7.1*  --   --   --   --  8.4* 7.8*  --  8.0* 7.9*  AST 532*  --   --   --   --   --   --  1,557*  --   --   --   --  1,104* 357*  --   --   --  ALT 235*  --   --   --   --   --   --  849*  --   --   --   --  905* 515*  --   --   --   ALKPHOS 149*  --   --   --   --   --   --  106  --   --   --   --  150* 141*  --   --   --   BILITOT 5.9*  --   --   --   --   --   --  3.7*  --   --   --   --  3.5* 1.9*  --   --   --   ALBUMIN 3.1*  --   --   --   --   --   --  2.3*  --   --   --   --  2.6* 2.7*  --  2.7*  --   MG 1.4*  --   --   --   --   --  1.3* 1.6*  --   --   --   --  2.5* 2.6*  --   --   --   CRP  --   --   --   --   --   --   --   --   --   --   --   --   --   --   --   --  25.4*  PROCALCITON  --   --   --   --   --   --   --   --   --   --   --   --   --   --   --   --  13.32  LATICACIDVEN  --    < >  --   --  7.8*   < >  --  5.1* 5.7* 5.4* 5.1*  --  3.0*  --   --   --   --   INR 1.1  --   --   --   --   --   --   --   --   --   --   --   --   --   --   --   --   HGBA1C  --   --   --   --  6.1*  --   --   --   --   --   --   --   --   --   --   --   --   AMMONIA  --   --  57*  --   --   --   --   --   --   --   --   --   --   --  26  --   --    < > = values in this interval not displayed.    ------------------------------------------------------------------------------------------------------------------ No results for input(s): CHOL, HDL, LDLCALC, TRIG, CHOLHDL, LDLDIRECT in the last 72 hours.  Lab Results  Component Value Date   HGBA1C 6.1 (H) 10/23/2021    No results for input(s): TSH, T4TOTAL, T3FREE, THYROIDAB in the last 72 hours.  Invalid input(s): FREET3 ------------------------------------------------------------------------------------------------------------------ ID Labs Recent Labs  Lab 10/24/21 0411 10/24/21 0736 10/24/21 1159 10/24/21 1508  10/25/21 0029 10/26/21 0112 10/27/21  1610 10/28/21 0317  WBC 11.4*  --   --   --  9.3 12.0* 9.6 11.0*  PLT 75*  --   --   --  63* 72* 70* 92*  CRP  --   --   --   --   --   --   --  25.4*  PROCALCITON  --   --   --   --   --   --   --  13.32  LATICACIDVEN 5.1* 5.7* 5.4* 5.1* 3.0*  --   --   --   CREATININE 3.19*  --   --   --  4.42* 3.65* 1.90* 1.51*   Cardiac Enzymes No results for input(s): CKMB, TROPONINI, MYOGLOBIN in the last 168 hours.  Invalid input(s): CK   Radiology Reports No results found.

## 2021-10-28 NOTE — Progress Notes (Signed)
[facility]  Date of Admission:  10/23/2021           Reason for visit: Follow up on cholangitis/bacteremia  Current antibiotics: Unasyn 2/14 -- present   Previous antibiotics: Pip Tazo 2/12 x 1 dose Ceftriaxone 2/12 -- 2/14 Metronidazole 2/12 -- 2/14  ASSESSMENT:    81 y.o. male admitted with:  Strep infantarius and E. coli bacteremia: Secondary to cholangitis status post ERCP with gastroenterology and removal of gallstones.  Repeat blood cultures negative from 10/24/2021. Severe sepsis: Due to #1.  Improved. Acute kidney injury: Creatinine continues to improve.  Seen by nephrology this admission.  Unasyn dosing adjusted given his improved renal function. Elevated LFTs, T. bili: Improved on previous labs. Encephalopathy: Resolved.  RECOMMENDATIONS:    Continue Unasyn as is Await TEE later today If TEE is negative, can plan to transition to Augmentin for 2 weeks from date of negative blood cultures with end date of 11/07/2021 If TEE is positive, will need plans for PICC line and long-term IV antibiotics Lab monitoring Should probably have evaluation for colonic neoplasia as an outpatient but current bacteremia is secondary to cholangitis Will follow   Principal Problem:   Bacteremia due to Streptococcus Active Problems:   Coronary artery disease   Paroxysmal A-fib (HCC)   Sepsis ([place])   Calculus of bile duct with cholangitis and obstruction   Jaundice   Elevated LFTs   Hyperbilirubinemia   Bacteremia due to Escherichia coli    MEDICATIONS:    Scheduled Meds:  chlorhexidine  15 mL Mouth Rinse BID   Chlorhexidine Gluconate Cloth  6 each Topical Q0600   enoxaparin (LOVENOX) injection  1 mg/kg Subcutaneous Q12H   insulin aspart  0-15 Units Subcutaneous TID WC   lactulose  30 g Oral Daily   mouth rinse  15 mL Mouth Rinse q12n4p   metoprolol tartrate  25 mg Oral BID   sodium bicarbonate  650 mg Oral BID   tamsulosin  0.4 mg Oral  Daily   Continuous Infusions:  sodium chloride 20 mL/hr at 10/28/21 0610   ampicillin-sulbactam (UNASYN) IV 3 g (10/28/21 0857)   PRN Meds:.docusate sodium, hydrALAZINE, ondansetron (ZOFRAN) IV, polyethylene glycol  SUBJECTIVE:   24 hour events:  No acute events noted overnight Patient moved out of the ICU to 5 W.  Patient with no new complaints.  Reports he feels great.  He is hungry and just waiting on his TEE at this point.  They are anxious to discharge home when it is safe to do so.  Review of Systems  All other systems reviewed and are negative.    OBJECTIVE:   Blood pressure (!) 155/78, pulse 87, temperature 98.9 F (37.2 C), temperature source Oral, resp. rate 17, height 5\' 11"  (1.803 m), weight 120 kg, SpO2 94 %. Body mass index is 36.9 kg/m.  Physical Exam Constitutional:      General: He is not in acute distress.    Appearance: Normal appearance.  HENT:     Head: Normocephalic and atraumatic.  Eyes:     Extraocular Movements: Extraocular movements intact.     Conjunctiva/sclera: Conjunctivae normal.  Pulmonary:     Effort: Pulmonary effort is normal. No respiratory distress.  Abdominal:     General: There is no distension.     Palpations: Abdomen is soft.     Tenderness: There is no abdominal tenderness. There is no guarding or rebound.  Musculoskeletal:     Cervical back:  Normal range of motion and neck supple.  Neurological:     General: No focal deficit present.     Mental Status: He is alert and oriented to person, place, and time.  Psychiatric:        Mood and Affect: Mood normal.        Behavior: Behavior normal.     Lab Results: Lab Results  Component Value Date   WBC 11.0 (H) 10/28/2021   HGB 11.0 (L) 10/28/2021   HCT 32.0 (L) 10/28/2021   MCV 85.1 10/28/2021   PLT 92 (L) 10/28/2021    Lab Results  Component Value Date   NA 142 10/28/2021   K 3.2 (L) 10/28/2021   CO2 24 10/28/2021   GLUCOSE 157 (H) 10/28/2021   BUN 49 (H)  10/28/2021   CREATININE 1.51 (H) 10/28/2021   CALCIUM 7.9 (L) 10/28/2021   GFRNONAA 47 (L) 10/28/2021   GFRAA 55 (L) 09/29/2020    Lab Results  Component Value Date   ALT 515 (H) 10/26/2021   AST 357 (H) 10/26/2021   ALKPHOS 141 (H) 10/26/2021   BILITOT 1.9 (H) 10/26/2021       Component Value Date/Time   CRP 25.4 (H) 10/28/2021 0317    No results found for: ESRSEDRATE   I have reviewed the micro and lab results in Epic.  Imaging: No results found.   Imaging independently reviewed in Epic.    Raynelle High[facility] for Infectious Disease [facility] [phone] pager 10/28/2021, 11:01 AM

## 2021-10-28 NOTE — Anesthesia Preprocedure Evaluation (Signed)
Anesthesia Evaluation  Patient identified by MRN, date of birth, ID band Patient awake    Reviewed: Allergy & Precautions, NPO status , Patient's Chart, lab work & pertinent test results  Airway Mallampati: II       Dental   Pulmonary    breath sounds clear to auscultation       Cardiovascular hypertension, + CAD  + dysrhythmias Atrial Fibrillation  Rhythm:Irregular Rate:Normal     Neuro/Psych    GI/Hepatic Neg liver ROS, GERD  ,  Endo/Other  diabetes  Renal/GU Renal disease     Musculoskeletal   Abdominal   Peds  Hematology   Anesthesia Other Findings   Reproductive/Obstetrics                             Anesthesia Physical Anesthesia Plan  ASA: 3  Anesthesia Plan: MAC   Post-op Pain Management:    Induction: Intravenous  PONV Risk Score and Plan: Treatment may vary due to age or medical condition and Propofol infusion  Airway Management Planned: Simple Face Mask  Additional Equipment:   Intra-op Plan:   Post-operative Plan:   Informed Consent: I have reviewed the patients History and Physical, chart, labs and discussed the procedure including the risks, benefits and alternatives for the proposed anesthesia with the patient or authorized representative who has indicated his/her understanding and acceptance.     Dental advisory given  Plan Discussed with: CRNA and Anesthesiologist  Anesthesia Plan Comments:         Anesthesia Quick Evaluation

## 2021-10-28 NOTE — Progress Notes (Addendum)
At bedside to place PICC.  Cephalic with 8% occupancy, basilic small and brachial with bifurcations until close to axilla.  Cephalic vein accessed with ease and guidewire inserted without difficulties.  When attempting to thread PICC, on sherlock PICC noted to be turning toward the axilla.  Unable to thread PICC into SVC.  Attempts to repostion the line with patient positioning and flushing unsuccessful.  Bedside nurse Jun, RN and patient's wife updated.  Plan to attempt again on 10/29/21.

## 2021-10-28 NOTE — Care Management Important Message (Signed)
Important Message  Patient Details  Name: [name] Young. MRN: [id number] Date of Birth: 02-Aug-1941   Medicare Important Message Given:  Yes     Liliani Bobo 10/28/2021, 2:41 PM

## 2021-10-28 NOTE — Transfer of Care (Signed)
Immediate Anesthesia Transfer of Care Note  Patient: [name] Young.  Procedure(s) Performed: TRANSESOPHAGEAL ECHOCARDIOGRAM (TEE)  Patient Location: PACU  Anesthesia Type:MAC  Level of Consciousness: awake, drowsy and confused  Airway & Oxygen Therapy: Patient Spontanous Breathing and Patient connected to nasal cannula oxygen  Post-op Assessment: Report given to RN and Post -op Vital signs reviewed and stable  Post vital signs: Reviewed and stable  Last Vitals:  Vitals Value Taken Time  BP 129/67 10/28/21 1412  Temp    Pulse 76 10/28/21 1413  Resp 26 10/28/21 1413  SpO2 97 % 10/28/21 1413  Vitals shown include unvalidated device data.  Last Pain:  Vitals:   10/28/21 1225  TempSrc:   PainSc: 0-No pain         Complications: No notable events documented.

## 2021-10-29 DIAGNOSIS — I4891 Unspecified atrial fibrillation: Secondary | ICD-10-CM | POA: Diagnosis not present

## 2021-10-29 DIAGNOSIS — Z7189 Other specified counseling: Secondary | ICD-10-CM

## 2021-10-29 DIAGNOSIS — Z66 Do not resuscitate: Secondary | ICD-10-CM

## 2021-10-29 DIAGNOSIS — Z515 Encounter for palliative care: Secondary | ICD-10-CM

## 2021-10-29 LAB — CBC WITH DIFFERENTIAL/PLATELET
Abs Immature Granulocytes: 0.73 10*3/uL — ABNORMAL HIGH (ref 0.00–0.07)
Basophils Absolute: 0.1 10*3/uL (ref 0.0–0.1)
Basophils Relative: 0 %
Eosinophils Absolute: 0.2 10*3/uL (ref 0.0–0.5)
Eosinophils Relative: 2 %
HCT: 31 % — ABNORMAL LOW (ref 39.0–52.0)
Hemoglobin: 10.1 g/dL — ABNORMAL LOW (ref 13.0–17.0)
Immature Granulocytes: 5 %
Lymphocytes Relative: 12 %
Lymphs Abs: 1.6 10*3/uL (ref 0.7–4.0)
MCH: 28.1 pg (ref 26.0–34.0)
MCHC: 32.6 g/dL (ref 30.0–36.0)
MCV: 86.4 fL (ref 80.0–100.0)
Monocytes Absolute: 1.1 10*3/uL — ABNORMAL HIGH (ref 0.1–1.0)
Monocytes Relative: 8 %
Neutro Abs: 10 10*3/uL — ABNORMAL HIGH (ref 1.7–7.7)
Neutrophils Relative %: 73 %
Platelets: 124 10*3/uL — ABNORMAL LOW (ref 150–400)
RBC: 3.59 MIL/uL — ABNORMAL LOW (ref 4.22–5.81)
RDW: 16.5 % — ABNORMAL HIGH (ref 11.5–15.5)
WBC: 13.7 10*3/uL — ABNORMAL HIGH (ref 4.0–10.5)
nRBC: 0 % (ref 0.0–0.2)

## 2021-10-29 LAB — CULTURE, BLOOD (ROUTINE X 2)
Culture: NO GROWTH
Culture: NO GROWTH
Special Requests: ADEQUATE

## 2021-10-29 LAB — COMPREHENSIVE METABOLIC PANEL
ALT: 104 U/L — ABNORMAL HIGH (ref 0–44)
AST: 32 U/L (ref 15–41)
Albumin: 2.2 g/dL — ABNORMAL LOW (ref 3.5–5.0)
Alkaline Phosphatase: 97 U/L (ref 38–126)
Anion gap: 10 (ref 5–15)
BUN: 35 mg/dL — ABNORMAL HIGH (ref 8–23)
CO2: 24 mmol/L (ref 22–32)
Calcium: 7.8 mg/dL — ABNORMAL LOW (ref 8.9–10.3)
Chloride: 107 mmol/L (ref 98–111)
Creatinine, Ser: 1.14 mg/dL (ref 0.61–1.24)
GFR, Estimated: >60 mL/min (ref >60)
Glucose, Bld: 174 mg/dL — ABNORMAL HIGH (ref 70–99)
Potassium: 3.5 mmol/L (ref 3.5–5.1)
Sodium: 141 mmol/L (ref 135–145)
Total Bilirubin: 1.8 mg/dL — ABNORMAL HIGH (ref 0.3–1.2)
Total Protein: 5.2 g/dL — ABNORMAL LOW (ref 6.5–8.1)

## 2021-10-29 LAB — PROCALCITONIN: Procalcitonin: 5.9 ng/mL

## 2021-10-29 LAB — GLUCOSE, CAPILLARY
Glucose-Capillary: 202 mg/dL — ABNORMAL HIGH (ref 70–99)
Glucose-Capillary: 163 mg/dL — ABNORMAL HIGH (ref 70–99)
Glucose-Capillary: 173 mg/dL — ABNORMAL HIGH (ref 70–99)
Glucose-Capillary: 152 mg/dL — ABNORMAL HIGH (ref 70–99)

## 2021-10-29 LAB — MAGNESIUM: Magnesium: 1.9 mg/dL (ref 1.7–2.4)

## 2021-10-29 LAB — BRAIN NATRIURETIC PEPTIDE: B Natriuretic Peptide: 727.2 pg/mL — ABNORMAL HIGH (ref 0.0–100.0)

## 2021-10-29 LAB — C-REACTIVE PROTEIN: CRP: 16.7 mg/dL — ABNORMAL HIGH (ref <1.0)

## 2021-10-29 MED ORDER — SODIUM CHLORIDE 0.9% FLUSH: 10.0000 mL | INTRAVENOUS | Status: DC | PRN

## 2021-10-29 MED ORDER — METOPROLOL SUCCINATE ER 50 MG PO TB24
50.0000 mg | ORAL_TABLET | Freq: Every day | ORAL | Status: DC
  Administered 2021-10-30 – 2021-11-05 (×7): 50 mg via ORAL
  Filled 2021-10-29 (×8): qty 1

## 2021-10-29 MED ORDER — SODIUM CHLORIDE 0.9% FLUSH
10.0000 mL | Freq: Two times a day (BID) | INTRAVENOUS | Status: DC
  Administered 2021-10-29 – 2021-11-05 (×14): 10 mL

## 2021-10-29 MED ORDER — POTASSIUM CHLORIDE CRYS ER 20 MEQ PO TBCR
40.0000 meq | EXTENDED_RELEASE_TABLET | Freq: Once | ORAL | Status: AC
  Administered 2021-10-29: 40 meq via ORAL
  Filled 2021-10-29: qty 2

## 2021-10-29 NOTE — Progress Notes (Addendum)
RN entered pt's room to ask if he would like his PRN melatonin; Pt noted to be resting quietly with eyes closed, RR even & unlabored, medication not offered at this time.

## 2021-10-29 NOTE — Progress Notes (Signed)
Progress Note  Patient Name: [name] Young. Date of Encounter: 10/29/2021  [facility] Cardiologist: Nelva Bush, MD   Subjective   No complaints  Inpatient Medications    Scheduled Meds:  chlorhexidine  15 mL Mouth Rinse BID   Chlorhexidine Gluconate Cloth  6 each Topical Q0600   enoxaparin (LOVENOX) injection  1 mg/kg Subcutaneous Q12H   insulin aspart  0-15 Units Subcutaneous TID WC   lactulose  30 g Oral Daily   mouth rinse  15 mL Mouth Rinse q12n4p   metoprolol tartrate  25 mg Oral BID   sodium chloride flush  10-40 mL Intracatheter Q12H   tamsulosin  0.4 mg Oral Daily   Continuous Infusions:  ampicillin-sulbactam (UNASYN) IV 3 g (10/29/21 0921)   PRN Meds: docusate sodium, hydrALAZINE, melatonin, ondansetron (ZOFRAN) IV, polyethylene glycol, sodium chloride flush   Vital Signs    Vitals:   10/28/21 2036 10/29/21 0000 10/29/21 0400 10/29/21 0800  BP: (!) 156/67 (!) 158/68 135/73 (!) 153/61  Pulse: 75 75 78   Resp: 19 20 (!) 24 (!) 22  Temp: 97.8 F (36.6 C) 98.5 F (36.9 C) 98.2 F (36.8 C)   TempSrc: Oral Axillary Oral   SpO2: 96% 94% 94%   Weight:      Height:        Intake/Output Summary (Last 24 hours) at 10/29/2021 1238 Last data filed at 10/29/2021 0400 Gross per 24 hour  Intake 250 ml  Output 1850 ml  Net -1600 ml   Last 3 Weights 10/28/2021 10/27/2021 10/26/2021  Weight (lbs) 264 lb 8.8 oz 261 lb 3.9 oz 262 lb 12.6 oz  Weight (kg) 120 kg 118.5 kg 119.2 kg      Telemetry    SR, PVCs, PACs - Personally Reviewed  ECG    N/a - Personally Reviewed  Physical Exam   GEN: No acute distress.   Neck: No JVD Cardiac: RRR, no murmurs, rubs, or gallops.  Respiratory: Clear to auscultation bilaterally. GI: Soft, nontender, non-distended  MS: No edema; No deformity. Neuro:  Nonfocal  Psych: Normal affect   Labs    High Sensitivity Troponin:   Recent Labs  Lab 10/23/21 0334 10/23/21 0559  TROPONINIHS 68* 142*      Chemistry Recent Labs  Lab 10/25/21 0029 10/26/21 0112 10/27/21 0852 10/28/21 0317 10/29/21 0209  NA 140 141 144 142 141  K 4.4 3.6 3.1* 3.2* 3.5  CL 102 106 109 108 107  CO2 21* 19* 21* 24 24  GLUCOSE 96 114* 139* 157* 174*  BUN 57* 71* 61* 49* 35*  CREATININE 4.42* 3.65* 1.90* 1.51* 1.14  CALCIUM 8.4* 7.8* 8.0* 7.9* 7.8*  MG 2.5* 2.6*  --   --  1.9  PROT 5.6* 5.5*  --   --  5.2*  ALBUMIN 2.6* 2.7* 2.7*  --  2.2*  AST 1,104* 357*  --   --  32  ALT 905* 515*  --   --  104*  ALKPHOS 150* 141*  --   --  97  BILITOT 3.5* 1.9*  --   --  1.8*  GFRNONAA 13* 16* 35* 47* >60  ANIONGAP 17* 16* 14 10 10     Lipids No results for input(s): CHOL, TRIG, HDL, LABVLDL, LDLCALC, CHOLHDL in the last 168 hours.  Hematology Recent Labs  Lab 10/27/21 0852 10/28/21 0317 10/29/21 0209  WBC 9.6 11.0* 13.7*  RBC 3.71* 3.76* 3.59*  HGB 10.8* 11.0* 10.1*  HCT 31.7* 32.0* 31.0*  MCV 85.4 85.1 86.4  MCH 29.1 29.3 28.1  MCHC 34.1 34.4 32.6  RDW 16.1* 16.5* 16.5*  PLT 70* 92* 124*   Thyroid No results for input(s): TSH, FREET4 in the last 168 hours.  BNP Recent Labs  Lab 10/29/21 0209  BNP 727.2*    DDimer No results for input(s): DDIMER in the last 168 hours.   Radiology    DG Chest Port 1 View  Result Date: 10/28/2021 CLINICAL DATA:  Shortness of breath EXAM: PORTABLE CHEST 1 VIEW COMPARISON:  Radiograph 10/23/2021 FINDINGS: Unchanged enlarged cardiac silhouette. Prior median sternotomy and CABG. There are left basilar opacities. Small bilateral pleural effusions, left greater than right. No visible pneumothorax. No acute osseous abnormality. IMPRESSION: Small bilateral pleural effusions, left greater than right. Left basilar opacities could be atelectasis or infection. Unchanged cardiomegaly. Electronically Signed   By: Maurine Simmering M.D.   On: 10/28/2021 11:58   ECHO TEE  Result Date: 10/28/2021    TRANSESOPHOGEAL ECHO REPORT   Patient Name:   [name] Young. Date of Exam:  10/28/2021 Medical Rec #:  [id number]            Height:       71.0 in Accession #:    [id number]           Weight:       264.6 lb Date of Birth:  04-30-1941           BSA:          2.375 m Patient Age:    81 years             BP:           169/70 mmHg Patient Gender: M                    HR:           77 bpm. Exam Location:  Inpatient Procedure: Transesophageal Echo, Cardiac Doppler and Color Doppler Indications:     Bacteremia  History:         Patient has prior history of Echocardiogram examinations, most                  recent 10/24/2021. CAD, Abnormal ECG and Prior CABG,                  Arrythmias:Atrial Fibrillation, Signs/Symptoms:Bacteremia; Risk                  Factors:Diabetes.  Sonographer:     Roseanna Rainbow RDCS Referring Phys:  [id number] [facility] B ROBERTS Diagnosing Phys: Lyman Bishop MD PROCEDURE: After discussion of the risks and benefits of a TEE, an informed consent was obtained from the patient. The transesophogeal probe was passed without difficulty through the esophogus of the patient. Imaged were obtained with the patient in a supine position. Sedation performed by different physician. The patient was monitored while under deep sedation. Anesthestetic sedation was provided intravenously by Anesthesiology: 168mg  of Propofol. The patient developed no complications during the procedure. IMPRESSIONS  1. Left ventricular ejection fraction, by estimation, is 40 to 45%. The left ventricle has mildly decreased function. The left ventricle demonstrates global hypokinesis.  2. Right ventricular systolic function is normal. The right ventricular size is normal.  3. No left atrial/left atrial appendage thrombus was detected.  4. Diffuse thickening and increased echogenicity of the anterior mitral leaflet with small mobile vegetation, consistent with endocarditis. The mitral valve is abnormal. Mild mitral valve  regurgitation.  5. Small vegetation on the mitral valve.  6. No vegetation, Lambl's noted on the leaflet  tips. The aortic valve is tricuspid. There is moderate thickening of the aortic valve. Aortic valve regurgitation is not visualized.  7. Aortic dilatation noted. There is borderline dilatation of the ascending aorta, measuring 38 mm. Conclusion(s)/Recommendation(s): Findings are concerning for vegetation/infective endocarditis as detailed above. FINDINGS  Left Ventricle: Left ventricular ejection fraction, by estimation, is 40 to 45%. The left ventricle has mildly decreased function. The left ventricle demonstrates global hypokinesis. The left ventricular internal cavity size was normal in size. Right Ventricle: The right ventricular size is normal. No increase in right ventricular wall thickness. Right ventricular systolic function is normal. Left Atrium: Left atrial size was normal in size. No left atrial/left atrial appendage thrombus was detected. Right Atrium: Right atrial size was normal in size. Pericardium: There is no evidence of pericardial effusion. Mitral Valve: Diffuse thickening and increased echogenicity of the anterior mitral leaflet with small mobile vegetation, consistent with endocarditis. The mitral valve is abnormal. A small vegetation is seen on the anterior mitral leaflet. The MV vegetation measures 5 mm x 5 mm. Mild mitral valve regurgitation. Tricuspid Valve: The tricuspid valve is grossly normal. Tricuspid valve regurgitation is trivial. Aortic Valve: No vegetation, Lambl's noted on the leaflet tips. The aortic valve is tricuspid. There is moderate thickening of the aortic valve. Aortic valve regurgitation is not visualized. Pulmonic Valve: The pulmonic valve was normal in structure. Pulmonic valve regurgitation is not visualized. Aorta: Aortic dilatation noted. There is borderline dilatation of the ascending aorta, measuring 38 mm. IAS/Shunts: No atrial level shunt detected by color flow Doppler. Lyman Bishop MD Electronically signed by Lyman Bishop MD Signature Date/Time:  10/28/2021/2:25:50 PM    Final    [place] EKG SITE RITE  Result Date: 10/28/2021 If Site Rite image not attached, placement could not be confirmed due to current cardiac rhythm.   Cardiac Studies     Patient Profile     81 y.o. male hx of 3v CAD with CABG 2019, with post op a fib, HTN, HLD, DM-2, CKD 3, now admitted with abd pain due to biliary tract obstruction and ascending cholangitis undergoing ERCP removing 2 stones, sepsis and a fib who is being seen 10/24/2021 for the evaluation of atrial fib and new CM with EF 40-45%.  Assessment & Plan    1.Afib -on oral lopressor, hepar gtt. DOAC later in admission when no invasive procedures are needed - tele shows he is back in SR   2. Acute systolic heart failure - 02/5464 echo LVEF 40-45%, grade II dd, normal RV,  - new mild LV dysfunction in setting of severe sysetmic disease.   -on lopressor 25mg  bid. Change to toprol 50mg  daily starting tomorrow. If renal function remains stable ARB tomorrow if tolerated then ARNI.    3.Sepsis Ecoli and streptococcus bacteremia/Endocarditis - in setting of cholangitis - s/p ERCP - abx per ID - TEE small anterior MV leaflet vegetation, mild MR  4. History of CAD with prior CABG   5. AKI - in setting of sepsis Resolving  For questions or updates, please contact [facility] Please consult [email or web address] for contact info under        Signed, Carlyle Dolly, MD  10/29/2021, 12:38 PM

## 2021-10-29 NOTE — NC FL2 (Signed)
[place] LEVEL OF CARE SCREENING TOOL     IDENTIFICATION  Patient Name: [name] Young. Birthdate: 1940/12/01 Sex: male Admission Date (Current Location): 10/23/2021  [facility] and [place] Number:  Herbalist and Address:  [facility], [place] [address] [place], [place] [address]      Provider Number: [id number]  Attending Physician Name and Address:  Thurnell Lose, MD  Relative Name and Phone Number:       Current Level of Care: Hospital Recommended Level of Care: [place] Prior Approval Number:    Date Approved/Denied:   PASRR Number: [id number]  Discharge Plan: SNF    Current Diagnoses: Patient Active Problem List   Diagnosis Date Noted   Bacteremia due to Escherichia coli 10/24/2021   Bacteremia due to Streptococcus 10/24/2021   Hyperbilirubinemia    Sepsis ([place]) 10/23/2021   Calculus of bile duct with cholangitis and obstruction    Jaundice    Elevated LFTs    Vitamin B12 deficiency 05/24/2020   Vitamin D deficiency 05/24/2020   Early dry stage nonexudative age-related macular degeneration of both eyes 12/01/2019   Pain due to onychomycosis of toenails of both feet 07/28/2019   Diabetic neuropathy ([place]) 07/28/2019   Hearing loss, right 05/22/2019   Chronic heart failure with preserved ejection fraction ([place]) 05/24/2018   Paroxysmal A-fib ([place]) 02/08/2018   Debility 01/28/2018   Anemia    S/P CABG x 3 01/18/2018   Coronary artery disease 12/29/2017   Chronic inactive rheumatic heart disease 11/24/2017   DNR (do not resuscitate) 06/14/2017   Peripheral neuropathy 06/14/2017   Tinea cruris 04/02/2017   Health maintenance examination 12/12/2016   Severe obesity (BMI 35.0-39.9) with comorbidity ([place]) 12/12/2016   Advanced care planning/counseling discussion 10/30/2014   Benign prostatic hyperplasia 10/30/2014   CKD stage 3 due to type 2 diabetes mellitus ([place]) 07/08/2013   Tinea  corporis 01/16/2013   Medicare annual wellness visit, subsequent 10/11/2011   Controlled diabetes mellitus type 2 with complications ([place])    Essential hypertension    Hyperlipidemia associated with type 2 diabetes mellitus ([place])    History of kidney stones    GERD (gastroesophageal reflux disease)     Orientation RESPIRATION BLADDER Height & Weight     Self, Time, Situation, Place  Normal Continent, External catheter Weight: 264 lb 8.8 oz (120 kg) Height:  5\' 11"  (180.3 cm)  BEHAVIORAL SYMPTOMS/MOOD NEUROLOGICAL BOWEL NUTRITION STATUS      Incontinent Diet (See dc summary)  AMBULATORY STATUS COMMUNICATION OF NEEDS Skin   Extensive Assist Verbally Normal                       Personal Care Assistance Level of Assistance  Bathing, Feeding, Dressing Bathing Assistance: Maximum assistance Feeding assistance: Limited assistance Dressing Assistance: Maximum assistance     Functional Limitations Info  Hearing   Hearing Info: Impaired      SPECIAL CARE FACTORS FREQUENCY  PT (By licensed PT), OT (By licensed OT)     PT Frequency: 5x/week OT Frequency: 5x/week            Contractures Contractures Info: Not present    Additional Factors Info  Code Status, Allergies, Insulin Sliding Scale Code Status Info: Full Allergies Info: NKA   Insulin Sliding Scale Info: See dc summary       Current Medications (10/29/2021):  This is the current hospital active medication list Current Facility-Administered Medications  Medication Dose Route Frequency Provider Last Rate Last Admin   Ampicillin-Sulbactam (UNASYN) 3 g in sodium chloride 0.9 % 100 mL IVPB  3 g Intravenous Q6H Hilty, Nadean Corwin, MD 200 mL/hr at 10/29/21 0921 3 g at 10/29/21 0921   chlorhexidine (PERIDEX) 0.12 % solution 15 mL  15 mL Mouth Rinse BID Pixie Casino, MD   15 mL at 10/29/21 0918   Chlorhexidine Gluconate Cloth 2 % PADS 6 each  6 each Topical Q0600 Pixie Casino, MD   6 each at 10/29/21 0401    docusate sodium (COLACE) capsule 100 mg  100 mg Oral BID PRN Pixie Casino, MD       enoxaparin (LOVENOX) injection 120 mg  1 mg/kg Subcutaneous Q12H Pixie Casino, MD   120 mg at 10/28/21 2313   hydrALAZINE (APRESOLINE) injection 10 mg  10 mg Intravenous Q4H PRN Pixie Casino, MD   10 mg at 10/27/21 0217   insulin aspart (novoLOG) injection 0-15 Units  0-15 Units Subcutaneous TID WC Pixie Casino, MD   5 Units at 10/29/21 0921   lactulose (CHRONULAC) 10 GM/15ML solution 30 g  30 g Oral Daily Pixie Casino, MD   30 g at 10/28/21 1522   MEDLINE mouth rinse  15 mL Mouth Rinse q12n4p Pixie Casino, MD   15 mL at 10/27/21 1546   melatonin tablet 3 mg  3 mg Oral QHS PRN Pixie Casino, MD       metoprolol tartrate (LOPRESSOR) tablet 25 mg  25 mg Oral BID Pixie Casino, MD   25 mg at 10/29/21 0919   ondansetron (ZOFRAN) injection 4 mg  4 mg Intravenous Q6H PRN Hilty, Nadean Corwin, MD       polyethylene glycol (MIRALAX / GLYCOLAX) packet 17 g  17 g Oral Daily PRN Pixie Casino, MD       tamsulosin (FLOMAX) capsule 0.4 mg  0.4 mg Oral Daily Pixie Casino, MD   0.4 mg at 10/29/21 0919     Discharge Medications: Please see discharge summary for a list of discharge medications.  Relevant Imaging Results:  Relevant Lab Results:   Additional Information SSN: [id number]. [place] COVID-19 Vaccine 02/01/2021 , 06/22/2020 , 10/11/2019 , 09/21/2019. Will need 6 weeks IV antibiotics  Benard Halsted, LCSW

## 2021-10-29 NOTE — Progress Notes (Signed)
Peripherally Inserted Central Catheter Placement  The IV Nurse has discussed with the patient and/or persons authorized to consent for the patient, the purpose of this procedure and the potential benefits and risks involved with this procedure.  The benefits include less needle sticks, lab draws from the catheter, and the patient may be discharged home with the catheter. Risks include, but not limited to, infection, bleeding, blood clot (thrombus formation), and puncture of an artery; nerve damage and irregular heartbeat and possibility to perform a PICC exchange if needed/ordered by physician.  Alternatives to this procedure were also discussed.  B[place] Power PICC patient education guide, fact sheet on infection prevention and patient information card has been provided to patient /or left at bedside.    PICC Placement Documentation  PICC Single Lumen 97/53/00 Left Basilic 48 cm 0 cm (Active)  Indication for Insertion or Continuance of Line Home intravenous therapies (PICC only) 10/29/21 1138  Exposed Catheter (cm) 0 cm 10/29/21 1138  Site Assessment Clean, Dry, Intact;Clean;Dry;Other (Comment) 10/29/21 1138  Line Status Flushed;Saline locked;Blood return noted 10/29/21 1138  Dressing Type Transparent;Securing device 10/29/21 1138  Dressing Status Antimicrobial disc in place;Clean, Dry, Intact 10/29/21 1138  Safety Lock Not Applicable 51/10/21 1173  Line Care Connections checked and tightened 10/29/21 1138  Line Adjustment (NICU/IV Team Only) No 10/29/21 1138  Dressing Intervention New dressing;Securement device changed;Antimicrobial disc changed 10/29/21 1138  Dressing Change Due 11/05/21 10/29/21 1138       [place] 10/29/2021, 11:41 AM

## 2021-10-29 NOTE — Progress Notes (Signed)
PROGRESS NOTE                                                                                                                                                                                                             Patient Demographics:    [name] Young, is a 81 y.o. male, DOB - 1941/03/15, XQJ:[id number]  Outpatient Primary MD for the patient is Ria Bush, MD    LOS - 6  Admit date - 10/23/2021    Chief Complaint  Patient presents with   Abdominal Pain       Brief Narrative (HPI from H&P)     81 year old gentleman with a history of coronary artery disease status post CABG 2019, paroxysmal atrial fibrillation, diabetes, GERD, CKD who presented with abdominal pain, found to have ascending cholangitis and bacteremia, he was kept in ICU was seen by GI underwent ERCP with gallstone extraction, he was also seen by ID for bacteremia.  He was transferred to my service on 10/28/2021 on day 5 of his hospital admission   Subjective:   Patient in bed, appears comfortable, denies any headache, no fever, no chest pain or pressure, no shortness of breath , no abdominal pain. No new focal weakness.   Assessment  & Plan :   Sepsis and bacteremia with Streptococcus infantarius and E. Coli - caused by ascending cholangitis, had gallstones was seen by GI and he underwent ERCP with 2 gallbladder stones removed with sphincterotomy on 10/23/2021, seen by ID currently on IV antibiotics, TTE was stable unfortunately TEE done on 10/28/2021 does confirm mitral valve endocarditis, case discussed with ID PICC line placed on 10/28/2021 for prolonged IV antibiotics.  Thankfully sepsis pathophysiology has resolved and CRP and procalcitonin are trending down.  He is currently on Unasyn defer course and duration to ID.  2.  AKI on CKD 3B.  Seen by nephrology, AKI due to ATN from sepsis.  Improved after hydration and hemodynamic stability.  Creatinine  peaked at 4.5 now down to 1.5.  3.   Elevated LFTs due to #1 coming down.  4.  Acute drop in systolic function of heart with EF dropping to 45% down from 55% few years ago.  Likely due to sepsis.  Currently on beta-blocker, seen by cardiology.  Avoiding ACE/ARB due to renal failure.  Compensated from this fluid standpoint.  Outpatient  cardiology follow-up post discharge.  5.  Paroxysmal atrial fibrillation with [place] vas 2 score of greater than 3.  On combination of Lopressor and full dose Lovenox for now.  6.  Severe of weakness and deconditioning.  PT OT and monitor.  May require SNF.  7.  Hypokalemia.  Replaced.  8.  BPH.  On Flomax continue.  9.  DM type II.  For now sliding scale.  A1c satisfactory at 6.1  CBG (last 3)  Recent Labs    10/28/21 1539 10/28/21 2021 10/29/21 0837  GLUCAP 196* 201* 202*         Condition - Extremely Guarded  Family Communication  :  wife bedside 10/28/21  Code Status :  Full  Consults  :  PCCM, ID, Cards  PUD Prophylaxis :    Procedures  :     CT Head - non acute  ERCP with removal of 2 gallbladder stones and sphincterotomy on 10/23/2021.    CT Abd & Pelvis - Chest -  1. No evidence of acute aortic syndrome. 2. No acute findings in the thorax to account for the patient's symptoms. 3. 5 mm calculus at the right ureteropelvic junction. At this time, there is no proximal hydronephrosis to indicate urinary tract obstruction. 4. Cholelithiasis without evidence of acute cholecystitis. 5. Dilatation of the common bile duct. No intrahepatic biliary ductal dilatation to clearly indicate biliary tract obstruction. Additionally, there is no calcified choledocholithiasis. This is of uncertain etiology and significance, and could be age related, however, correlation with liver function tests is recommended. If there is clinical concern for biliary tract obstruction, further evaluation with abdominal MRI with and without IV gadolinium with MRCP  would be recommended. 6. Aortic atherosclerosis, in addition to left main and three-vessel coronary artery disease. Status post median sternotomy for CABG including LIMA to the LAD. 7. There are calcifications of the aortic valve. Echocardiographic correlation for evaluation of potential valvular dysfunction may be warranted if clinically indicated. 8. Additional incidental findings, as above.  TTE -  EF 45% with global hypokinesis  TEE -   Suspected endocarditis of the mitral valve with increased echogenicty, thickening and hockystick motion as well as a small mobile vegetation. There is mild MR. Findings are new compared to TEE in 2019 which was personally reviewed. Dilated ascending aorta to 38 mm. Thickening of the aortic valve leaflets without mobile vegetation. LVEF 40-45%, global hypokinesis. Pre-op EKG demonstrated the patient was back in sinus rhythm with PVC's, therefore, cardioversion was not pursued.      Disposition Plan  :    Status is: Inpatient  DVT Prophylaxis  :  Lovenox  SCDs Start: 10/23/21 5176  Lab Results  Component Value Date   PLT 124 (L) 10/29/2021    Diet :  Diet Order             DIET SOFT Room service appropriate? Yes; Fluid consistency: Nectar Thick  Diet effective now                    Inpatient Medications  Scheduled Meds:  chlorhexidine  15 mL Mouth Rinse BID   Chlorhexidine Gluconate Cloth  6 each Topical Q0600   enoxaparin (LOVENOX) injection  1 mg/kg Subcutaneous Q12H   insulin aspart  0-15 Units Subcutaneous TID WC   lactulose  30 g Oral Daily   mouth rinse  15 mL Mouth Rinse q12n4p   metoprolol tartrate  25 mg Oral BID   tamsulosin  0.4 mg  Oral Daily   Continuous Infusions:  ampicillin-sulbactam (UNASYN) IV 3 g (10/29/21 0921)   PRN Meds:.docusate sodium, hydrALAZINE, melatonin, ondansetron (ZOFRAN) IV, polyethylene glycol  Antibiotics  :    Anti-infectives (From admission, onward)    Start     Dose/Rate Route  Frequency Ordered Stop   10/27/21 2149  Ampicillin-Sulbactam (UNASYN) 3 g in sodium chloride 0.9 % 100 mL IVPB        3 g 200 mL/hr over 30 Minutes Intravenous Every 6 hours 10/27/21 1838     10/27/21 1647  Ampicillin-Sulbactam (UNASYN) 3 g in sodium chloride 0.9 % 100 mL IVPB  Status:  Discontinued        3 g 200 mL/hr over 30 Minutes Intravenous Every 8 hours 10/27/21 1018 10/27/21 1838   10/25/21 2200  Ampicillin-Sulbactam (UNASYN) 3 g in sodium chloride 0.9 % 100 mL IVPB  Status:  Discontinued        3 g 200 mL/hr over 30 Minutes Intravenous Every 12 hours 10/25/21 0928 10/27/21 1018   10/25/21 1015  Ampicillin-Sulbactam (UNASYN) 3 g in sodium chloride 0.9 % 100 mL IVPB  Status:  Discontinued        3 g 200 mL/hr over 30 Minutes Intravenous Every 12 hours 10/25/21 0919 10/25/21 0928   10/23/21 1530  piperacillin-tazobactam (ZOSYN) IVPB 3.375 g  Status:  Discontinued        3.375 g 12.5 mL/hr over 240 Minutes Intravenous Every 8 hours 10/23/21 1359 10/23/21 1420   10/23/21 1515  cefTRIAXone (ROCEPHIN) 2 g in sodium chloride 0.9 % 100 mL IVPB  Status:  Discontinued        2 g 200 mL/hr over 30 Minutes Intravenous Every 24 hours 10/23/21 1420 10/25/21 0919   10/23/21 1515  metroNIDAZOLE (FLAGYL) IVPB 500 mg  Status:  Discontinued        500 mg 100 mL/hr over 60 Minutes Intravenous Every 12 hours 10/23/21 1420 10/25/21 0919   10/23/21 1330  piperacillin-tazobactam (ZOSYN) IVPB 3.375 g  Status:  Discontinued        3.375 g 12.5 mL/hr over 240 Minutes Intravenous Every 8 hours 10/23/21 0714 10/23/21 1359   10/23/21 0715  piperacillin-tazobactam (ZOSYN) IVPB 3.375 g        3.375 g 100 mL/hr over 30 Minutes Intravenous  Once 10/23/21 0714 10/23/21 0741        Time Spent in minutes  30   Lala Lund M.D on 10/29/2021 at 11:18 AM  To page go to [email or web address]   [facility] -  Office  [phone]  See all Orders from today for further details    Objective:   Vitals:    10/28/21 2036 10/29/21 0000 10/29/21 0400 10/29/21 0800  BP: (!) 156/67 (!) 158/68 135/73 (!) 153/61  Pulse: 75 75 78   Resp: 19 20 (!) 24 (!) 22  Temp: 97.8 F (36.6 C) 98.5 F (36.9 C) 98.2 F (36.8 C)   TempSrc: Oral Axillary Oral   SpO2: 96% 94% 94%   Weight:      Height:        Wt Readings from Last 3 Encounters:  10/28/21 120 kg  06/07/21 118 kg  01/28/21 118.8 kg     Intake/Output Summary (Last 24 hours) at 10/29/2021 1118 Last data filed at 10/29/2021 0400 Gross per 24 hour  Intake 250 ml  Output 1850 ml  Net -1600 ml     Physical Exam  Awake Alert, No new F.N deficits, Normal affect [place].AT,PERRAL Supple  Neck, No JVD,   Symmetrical Chest wall movement, Good air movement bilaterally, CTAB RRR,No Gallops, Rubs or new Murmurs,  +ve B.Sounds, Abd Soft, No tenderness,   No Cyanosis, Clubbing or edema      Data Review:    CBC Recent Labs  Lab 10/23/21 0334 10/23/21 0431 10/23/21 2154 10/24/21 0411 10/25/21 0029 10/26/21 0112 10/27/21 0852 10/28/21 0317 10/29/21 0209  WBC 7.9   < > 16.2*   < > 9.3 12.0* 9.6 11.0* 13.7*  HGB 12.9*   < > 9.5*   < > 11.1* 10.2* 10.8* 11.0* 10.1*  HCT 40.6   < > 30.6*   < > 33.7* 31.2* 31.7* 32.0* 31.0*  PLT 193   < > 96*   < > 63* 72* 70* 92* 124*  MCV 93.8   < > 95.3   < > 89.2 88.9 85.4 85.1 86.4  MCH 29.8   < > 29.6   < > 29.4 29.1 29.1 29.3 28.1  MCHC 31.8   < > 31.0   < > 32.9 32.7 34.1 34.4 32.6  RDW 14.7   < > 15.7*   < > 15.8* 15.9* 16.1* 16.5* 16.5*  LYMPHSABS 0.3*  --  0.7  --  0.4*  --   --   --  1.6  MONOABS 0.1  --  0.7  --  0.4  --   --   --  1.1*  EOSABS 0.0  --  0.0  --  0.0  --   --   --  0.2  BASOSABS 0.0  --  0.0  --  0.0  --   --   --  0.1   < > = values in this interval not displayed.    Electrolytes Recent Labs  Lab 10/23/21 0334 10/23/21 0336 10/23/21 0401 10/23/21 0431 10/23/21 0933 10/23/21 1446 10/23/21 2154 10/24/21 0411 10/24/21 0736 10/24/21 1159 10/24/21 1508 10/24/21 2226  10/25/21 0029 10/26/21 0112 10/26/21 0811 10/27/21 0852 10/28/21 0317 10/29/21 0209  NA 137  --   --    < >  --   --  136 138  --   --   --    < > 140 141  --  144 142 141  K 4.9  --   --    < >  --   --  4.1 3.7  --   --   --    < > 4.4 3.6  --  3.1* 3.2* 3.5  CL 106  --   --   --   --   --  105 99  --   --   --   --  102 106  --  109 108 107  CO2 13*  --   --   --   --   --  14* 23  --   --   --   --  21* 19*  --  21* 24 24  GLUCOSE 219*  --   --   --   --   --  108* 374*  --   --   --   --  96 114*  --  139* 157* 174*  BUN 24*  --   --   --   --   --  34* 37*  --   --   --   --  57* 71*  --  61* 49* 35*  CREATININE 1.91*  --   --   --  2.11*  --  2.67* 3.19*  --   --   --   --  4.42* 3.65*  --  1.90* 1.51* 1.14  CALCIUM 9.1  --   --   --   --   --  7.6* 7.1*  --   --   --   --  8.4* 7.8*  --  8.0* 7.9* 7.8*  AST 532*  --   --   --   --   --   --  1,557*  --   --   --   --  1,104* 357*  --   --   --  32  ALT 235*  --   --   --   --   --   --  849*  --   --   --   --  905* 515*  --   --   --  104*  ALKPHOS 149*  --   --   --   --   --   --  106  --   --   --   --  150* 141*  --   --   --  97  BILITOT 5.9*  --   --   --   --   --   --  3.7*  --   --   --   --  3.5* 1.9*  --   --   --  1.8*  ALBUMIN 3.1*  --   --   --   --   --   --  2.3*  --   --   --   --  2.6* 2.7*  --  2.7*  --  2.2*  MG 1.4*  --   --   --   --   --  1.3* 1.6*  --   --   --   --  2.5* 2.6*  --   --   --  1.9  CRP  --   --   --   --   --   --   --   --   --   --   --   --   --   --   --   --  25.4* 16.7*  PROCALCITON  --   --   --   --   --   --   --   --   --   --   --   --   --   --   --   --  13.32 5.90  LATICACIDVEN  --    < >  --   --  7.8*   < >  --  5.1* 5.7* 5.4* 5.1*  --  3.0*  --   --   --   --   --   INR 1.1  --   --   --   --   --   --   --   --   --   --   --   --   --   --   --   --   --   HGBA1C  --   --   --   --  6.1*  --   --   --   --   --   --   --   --   --   --   --   --   --   AMMONIA  --   --  47*   --   --   --   --   --   --   --   --   --   --   --  26  --   --   --   BNP  --   --   --   --   --   --   --   --   --   --   --   --   --   --   --   --   --  727.2*   < > = values in this interval not displayed.    ------------------------------------------------------------------------------------------------------------------ No results for input(s): CHOL, HDL, LDLCALC, TRIG, CHOLHDL, LDLDIRECT in the last 72 hours.  Lab Results  Component Value Date   HGBA1C 6.1 (H) 10/23/2021    No results for input(s): TSH, T4TOTAL, T3FREE, THYROIDAB in the last 72 hours.  Invalid input(s): FREET3 ------------------------------------------------------------------------------------------------------------------ ID Labs Recent Labs  Lab 10/24/21 0411 10/24/21 0736 10/24/21 1159 10/24/21 1508 10/25/21 0029 10/26/21 0112 10/27/21 6063 10/28/21 0317 10/29/21 0209  WBC 11.4*  --   --   --  9.3 12.0* 9.6 11.0* 13.7*  PLT 75*  --   --   --  63* 72* 70* 92* 124*  CRP  --   --   --   --   --   --   --  25.4* 16.7*  PROCALCITON  --   --   --   --   --   --   --  13.32 5.90  LATICACIDVEN 5.1* 5.7* 5.4* 5.1* 3.0*  --   --   --   --   CREATININE 3.19*  --   --   --  4.42* 3.65* 1.90* 1.51* 1.14   Cardiac Enzymes No results for input(s): CKMB, TROPONINI, MYOGLOBIN in the last 168 hours.  Invalid input(s): CK   Radiology Reports DG Chest Port 1 View  Result Date: 10/28/2021 CLINICAL DATA:  Shortness of breath EXAM: PORTABLE CHEST 1 VIEW COMPARISON:  Radiograph 10/23/2021 FINDINGS: Unchanged enlarged cardiac silhouette. Prior median sternotomy and CABG. There are left basilar opacities. Small bilateral pleural effusions, left greater than right. No visible pneumothorax. No acute osseous abnormality. IMPRESSION: Small bilateral pleural effusions, left greater than right. Left basilar opacities could be atelectasis or infection. Unchanged cardiomegaly. Electronically Signed   By: Maurine Simmering M.D.    On: 10/28/2021 11:58   ECHO TEE  Result Date: 10/28/2021    TRANSESOPHOGEAL ECHO REPORT   Patient Name:   Jaeveon Ashland. Date of Exam: 10/28/2021 Medical Rec #:  [id number]            Height:       71.0 in Accession #:    [id number]           Weight:       264.6 lb Date of Birth:  [birth year]           BSA:          2.375 m Patient Age:    58 years             BP:           169/70 mmHg Patient Gender: M                    HR:           77 bpm. Exam Location:  Inpatient Procedure: Transesophageal Echo, Cardiac Doppler  and Color Doppler Indications:     Bacteremia  History:         Patient has prior history of Echocardiogram examinations, most                  recent 10/24/2021. CAD, Abnormal ECG and Prior CABG,                  Arrythmias:Atrial Fibrillation, Signs/Symptoms:Bacteremia; Risk                  Factors:Diabetes.  Sonographer:     Roseanna Rainbow RDCS Referring Phys:  [id number] [facility] B ROBERTS Diagnosing Phys: Lyman Bishop MD PROCEDURE: After discussion of the risks and benefits of a TEE, an informed consent was obtained from the patient. The transesophogeal probe was passed without difficulty through the esophogus of the patient. Imaged were obtained with the patient in a supine position. Sedation performed by different physician. The patient was monitored while under deep sedation. Anesthestetic sedation was provided intravenously by Anesthesiology: 168mg  of Propofol. The patient developed no complications during the procedure. IMPRESSIONS  1. Left ventricular ejection fraction, by estimation, is 40 to 45%. The left ventricle has mildly decreased function. The left ventricle demonstrates global hypokinesis.  2. Right ventricular systolic function is normal. The right ventricular size is normal.  3. No left atrial/left atrial appendage thrombus was detected.  4. Diffuse thickening and increased echogenicity of the anterior mitral leaflet with small mobile vegetation, consistent with endocarditis. The  mitral valve is abnormal. Mild mitral valve regurgitation.  5. Small vegetation on the mitral valve.  6. No vegetation, Lambl's noted on the leaflet tips. The aortic valve is tricuspid. There is moderate thickening of the aortic valve. Aortic valve regurgitation is not visualized.  7. Aortic dilatation noted. There is borderline dilatation of the ascending aorta, measuring 38 mm. Conclusion(s)/Recommendation(s): Findings are concerning for vegetation/infective endocarditis as detailed above. FINDINGS  Left Ventricle: Left ventricular ejection fraction, by estimation, is 40 to 45%. The left ventricle has mildly decreased function. The left ventricle demonstrates global hypokinesis. The left ventricular internal cavity size was normal in size. Right Ventricle: The right ventricular size is normal. No increase in right ventricular wall thickness. Right ventricular systolic function is normal. Left Atrium: Left atrial size was normal in size. No left atrial/left atrial appendage thrombus was detected. Right Atrium: Right atrial size was normal in size. Pericardium: There is no evidence of pericardial effusion. Mitral Valve: Diffuse thickening and increased echogenicity of the anterior mitral leaflet with small mobile vegetation, consistent with endocarditis. The mitral valve is abnormal. A small vegetation is seen on the anterior mitral leaflet. The MV vegetation measures 5 mm x 5 mm. Mild mitral valve regurgitation. Tricuspid Valve: The tricuspid valve is grossly normal. Tricuspid valve regurgitation is trivial. Aortic Valve: No vegetation, Lambl's noted on the leaflet tips. The aortic valve is tricuspid. There is moderate thickening of the aortic valve. Aortic valve regurgitation is not visualized. Pulmonic Valve: The pulmonic valve was normal in structure. Pulmonic valve regurgitation is not visualized. Aorta: Aortic dilatation noted. There is borderline dilatation of the ascending aorta, measuring 38 mm. IAS/Shunts:  No atrial level shunt detected by color flow Doppler. Lyman Bishop MD Electronically signed by Lyman Bishop MD Signature Date/Time: 10/28/2021/2:25:50 PM    Final    [place] EKG SITE RITE  Result Date: 10/28/2021 If Site Rite image not attached, placement could not be confirmed due to current cardiac rhythm.

## 2021-10-29 NOTE — Consult Note (Signed)
Palliative Medicine Inpatient Consult Note  Consulting Provider: Thurnell Lose, MD  Reason for consult:   [name] Young Palliative Medicine Consult  Reason for Consult? Bactermic, frail, GOC   HPI:  Per hospitalist note  --> 81 year old gentleman with a history of coronary artery disease status post CABG 2019, paroxysmal atrial fibrillation, diabetes, GERD, CKD who presented with abdominal pain, found to have ascending cholangitis and bacteremia, he was kept in ICU was seen by GI underwent ERCP with gallstone extraction, he was also seen by ID for bacteremia.  He was transferred to my service on 10/28/2021 on day 5 of his hospital admission.  Palliative care has been asked to get involved to further discuss goals of care in the setting of prolonged hospitalization, bacteremia, and overall frail physical condition.  Clinical Assessment/Goals of Care:  *Please note that this is a verbal dictation therefore any spelling or grammatical errors are due to the "[place] [employer]" system interpretation.  I have reviewed medical records including EPIC notes, labs and imaging, received report from bedside RN, assessed the patient who is lying in bed, somewhat drowsy though arousable.    I met with [name] Young and his spouse, [name] Young to further discuss diagnosis prognosis, GOC, EOL wishes, disposition and options.   I introduced Palliative Medicine as specialized medical care for people living with serious illness. It focuses on providing relief from the symptoms and stress of a serious illness. The goal is to improve quality of life for both the patient and the family.  Medical History Review and Understanding:  A review of [name] Young coronary artery disease was held and the need for a CABG in 2019.  We reviewed his abnormal heart rhythm more specifically his atrial fibrillation.  Discussed his diabetes and chronic kidney disease and the relationship between those two  diagnoses.  Reviewed patient's acute reasons for hospitalization inclusive of his bacteremia due to a gallstone.  Social History:  [name] Young is from [place], [place].  He has been married to his wife, [name] Young for the past 38 years.  They shared 3 children 1 of whom is alive and 2 of which are deceased.  They have 3 grandchildren.  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  Recreationally [name] Young likes to bear hunt and shares that he has hunted black bears in the past.  He is a man of faith and attends services regularly practicing within the [facility] denomination.  Functional and Nutritional State:  [name] Young was fully functional of all BADLs and IADLs prior to hospital admission.  He was able to use a cane for mobility though otherwise was still performing tasks such as showering, dressing, feeding and preparing meals.  He was still driving.  He was still able to perform payment of bills.  [name] Young is known to have a more robust appetite.  Advance Directives: A detailed discussion was had today regarding advanced directives.  [name] Young shares that [name] Young does have advanced directives and states she will bring in a copy of these.  Code Status: Concepts specific to code status, artifical feeding and hydration, continued IV antibiotics and rehospitalization was had.  [name] Young has been an established DO NOT RESUSCITATE per his primary care doctor Dr. Danise Mina.  Patient is wife share that he had "a gold DNR" on his refrigerator though this has been lost. We reviewed creating a new one.   Goals for the Future:  [name] Young would ideally  like to get home.Discussed concerns as they relate to his ability to care for himself. I shared that each hospital day results in substantial loss of strength which he understands. Reviewed the importance of optimization from a strength perspective.   Discussed the importance of continued  conversation with family and their  medical providers regarding overall plan of care and treatment options, ensuring decisions are within the context of the patients values and GOCs.  Decision Maker: [name] Young (spouse) [phone]  SUMMARY OF RECOMMENDATIONS   DNAR/DNI  Will complete a MOST prior to discharge  Ongoing [place] conversations  Goal: To get back to baseline and back home  TOC - OP Palliative  Ongoing Palliative support  Code Status/Advance Care Planning: DNAR/DNI  Palliative Prophylaxis:  Aspiration, Bowel Regimen, Delirium Protocol, Frequent Pain Assessment, Oral Care, Palliative Wound Care, and Turn Reposition  Additional Recommendations (Limitations, Scope, Preferences): Treat what is treatable  Psycho-social/Spiritual:  Desire for further Chaplaincy support: No Additional Recommendations: Education on sepsis   Prognosis: Prognosis unclear, patient was fully functional prior to admission  Discharge Planning: Discharge plan will depend upon progress with PT.  Vitals:   10/29/21 0400 10/29/21 0800  BP: 135/73 (!) 153/61  Pulse: 78   Resp: (!) 24 (!) 22  Temp: 98.2 F (36.8 C)   SpO2: 94%     Intake/Output Summary (Last 24 hours) at 10/29/2021 1137 Last data filed at 10/29/2021 0400 Gross per 24 hour  Intake 250 ml  Output 1850 ml  Net -1600 ml   Last Weight  Most recent update: 10/28/2021  3:35 AM    Weight  120 kg (264 lb 8.8 oz)            Gen:  Elderly M in NAD HEENT: moist mucous membranes CV: Regular rate and irregular rhythm PULM: ON RA breathing is even and nonlabored ABD: soft/nontender EXT: No edema  Neuro: Alert and oriented x2-3   PPS: 50%   This conversation/these recommendations were discussed with patient primary care team, Dr. Candiss Norse  Total Time: 82  MDM High  Medical Decision Making:4 #/Complex Problems:4                      Data Reviewed:4                 Management:4 (1-Straightforward, 2-Low,  3-Moderate, 4-High) ______________________________________________________ [place] Team Team Cell Phone: [phone] Please utilize secure chat with additional questions, if there is no response within 30 minutes please call the above phone number  Palliative Medicine Team providers are available by phone from 7am to 7pm daily and can be reached through the team cell phone.  Should this patient require assistance outside of these hours, please call the patient's attending physician.

## 2021-10-30 ENCOUNTER — Encounter (HOSPITAL_COMMUNITY): Payer: Self-pay | Admitting: Internal Medicine

## 2021-10-30 DIAGNOSIS — I4891 Unspecified atrial fibrillation: Secondary | ICD-10-CM | POA: Diagnosis not present

## 2021-10-30 DIAGNOSIS — Z515 Encounter for palliative care: Secondary | ICD-10-CM

## 2021-10-30 LAB — GLUCOSE, CAPILLARY
Glucose-Capillary: 182 mg/dL — ABNORMAL HIGH (ref 70–99)
Glucose-Capillary: 151 mg/dL — ABNORMAL HIGH (ref 70–99)
Glucose-Capillary: 153 mg/dL — ABNORMAL HIGH (ref 70–99)
Glucose-Capillary: 150 mg/dL — ABNORMAL HIGH (ref 70–99)

## 2021-10-30 LAB — CBC WITH DIFFERENTIAL/PLATELET
Abs Immature Granulocytes: 0.62 10*3/uL — ABNORMAL HIGH (ref 0.00–0.07)
Basophils Absolute: 0 10*3/uL (ref 0.0–0.1)
Basophils Relative: 0 %
Eosinophils Absolute: 0.2 10*3/uL (ref 0.0–0.5)
Eosinophils Relative: 1 %
HCT: 30.7 % — ABNORMAL LOW (ref 39.0–52.0)
Hemoglobin: 10 g/dL — ABNORMAL LOW (ref 13.0–17.0)
Immature Granulocytes: 4 %
Lymphocytes Relative: 12 %
Lymphs Abs: 1.9 10*3/uL (ref 0.7–4.0)
MCH: 28.7 pg (ref 26.0–34.0)
MCHC: 32.6 g/dL (ref 30.0–36.0)
MCV: 88 fL (ref 80.0–100.0)
Monocytes Absolute: 0.9 10*3/uL (ref 0.1–1.0)
Monocytes Relative: 6 %
Neutro Abs: 11.7 10*3/uL — ABNORMAL HIGH (ref 1.7–7.7)
Neutrophils Relative %: 77 %
Platelets: 148 10*3/uL — ABNORMAL LOW (ref 150–400)
RBC: 3.49 MIL/uL — ABNORMAL LOW (ref 4.22–5.81)
RDW: 16.9 % — ABNORMAL HIGH (ref 11.5–15.5)
WBC: 15.3 10*3/uL — ABNORMAL HIGH (ref 4.0–10.5)
nRBC: 0.1 % (ref 0.0–0.2)

## 2021-10-30 LAB — MAGNESIUM: Magnesium: 1.7 mg/dL (ref 1.7–2.4)

## 2021-10-30 LAB — COMPREHENSIVE METABOLIC PANEL
ALT: 63 U/L — ABNORMAL HIGH (ref 0–44)
AST: 27 U/L (ref 15–41)
Albumin: 2 g/dL — ABNORMAL LOW (ref 3.5–5.0)
Alkaline Phosphatase: 85 U/L (ref 38–126)
Anion gap: 10 (ref 5–15)
BUN: 25 mg/dL — ABNORMAL HIGH (ref 8–23)
CO2: 23 mmol/L (ref 22–32)
Calcium: 7.6 mg/dL — ABNORMAL LOW (ref 8.9–10.3)
Chloride: 105 mmol/L (ref 98–111)
Creatinine, Ser: 1.08 mg/dL (ref 0.61–1.24)
GFR, Estimated: >60 mL/min (ref >60)
Glucose, Bld: 155 mg/dL — ABNORMAL HIGH (ref 70–99)
Potassium: 3.6 mmol/L (ref 3.5–5.1)
Sodium: 138 mmol/L (ref 135–145)
Total Bilirubin: 2 mg/dL — ABNORMAL HIGH (ref 0.3–1.2)
Total Protein: 4.9 g/dL — ABNORMAL LOW (ref 6.5–8.1)

## 2021-10-30 LAB — C-REACTIVE PROTEIN: CRP: 14.2 mg/dL — ABNORMAL HIGH (ref <1.0)

## 2021-10-30 LAB — PROCALCITONIN: Procalcitonin: 3.48 ng/mL

## 2021-10-30 LAB — BRAIN NATRIURETIC PEPTIDE: B Natriuretic Peptide: 394.5 pg/mL — ABNORMAL HIGH (ref 0.0–100.0)

## 2021-10-30 MED ORDER — SACUBITRIL-VALSARTAN 24-26 MG PO TABS
1.0000 | ORAL_TABLET | Freq: Two times a day (BID) | ORAL | Status: DC
  Administered 2021-10-30 – 2021-11-05 (×13): 1 via ORAL
  Filled 2021-10-30 (×14): qty 1

## 2021-10-30 MED ORDER — ROSUVASTATIN CALCIUM 5 MG PO TABS
10.0000 mg | ORAL_TABLET | Freq: Every day | ORAL | Status: DC
  Administered 2021-10-30 – 2021-11-05 (×7): 10 mg via ORAL
  Filled 2021-10-30 (×7): qty 2

## 2021-10-30 MED ORDER — POTASSIUM CHLORIDE CRYS ER 20 MEQ PO TBCR
40.0000 meq | EXTENDED_RELEASE_TABLET | Freq: Once | ORAL | Status: AC
  Administered 2021-10-30: 40 meq via ORAL
  Filled 2021-10-30: qty 2

## 2021-10-30 MED ORDER — MAGNESIUM SULFATE 2 GM/50ML IV SOLN
2.0000 g | Freq: Once | INTRAVENOUS | Status: AC
  Administered 2021-10-30: 2 g via INTRAVENOUS
  Filled 2021-10-30: qty 50

## 2021-10-30 MED ORDER — FENOFIBRATE 160 MG PO TABS
160.0000 mg | ORAL_TABLET | Freq: Every day | ORAL | Status: DC
  Administered 2021-10-30 – 2021-11-05 (×7): 160 mg via ORAL
  Filled 2021-10-30 (×7): qty 1

## 2021-10-30 MED ORDER — FENOFIBRATE 145 MG PO TABS
145.0000 mg | ORAL_TABLET | Freq: Every day | ORAL | Status: DC
  Filled 2021-10-30: qty 1

## 2021-10-30 MED ORDER — APIXABAN 5 MG PO TABS
5.0000 mg | ORAL_TABLET | Freq: Two times a day (BID) | ORAL | Status: DC
Start: 2021-10-31 — End: 2021-11-06
  Administered 2021-10-31 – 2021-11-05 (×11): 5 mg via ORAL
  Filled 2021-10-30 (×12): qty 1

## 2021-10-30 MED ORDER — SODIUM CHLORIDE 0.9 % IV SOLN
3.0000 g | Freq: Four times a day (QID) | INTRAVENOUS | Status: DC
  Administered 2021-10-30 – 2021-10-31 (×4): 3 g via INTRAVENOUS
  Filled 2021-10-30 (×4): qty 8

## 2021-10-30 NOTE — Progress Notes (Signed)
Pharmacy Antibiotic + Anticoagulation Consult Note  [name] [name]. is a 81 y.o. male admitted on 10/23/2021 with  endocarditis  and atrial fibrillation.  Pharmacy has been consulted for ampicillin/sulbactam and apixaban dosing.  AC: Atrial fibrillation, not on anticoagulation PTA. Now back in NSR with HR <110 on oral metoprolol. Patient <80 y/o, SCr <1.5, and >60kg. CBC stable with Hgb 10 and Plts 148.  Patient has been on treatment dose Lovenox up to this point. Will discontinue Lovenox today and start apixaban tomorrow morning per Dr. Candiss Norse. Eliquis copay check: $40/month.   ID: Patient diagnosed with mitral valve endocarditis with cultures positive for E. coli and Strep. infantarius. WBC remain elevated at 15.3, patient afebrile, SCr 1.08. Per ID, continue Unasyn x6 weeks from negative culture. Stop date is 3/27, PICC line placed.   Plan: - Start apixaban 5mg  PO BID on 2/20 at 10:00 - Provide patient education once started - Monitor CBC and signs of bleeding.   - Continue Unasyn 3g q6h through 6-week stop date  - Monitor WBC, temp, renal function, signs of clinical improvement - F/u OPAT plan  Height: 5\' 11"  (180.3 cm) Weight: 121.6 kg (268 lb 1.3 oz) IBW/kg (Calculated) : 75.3  Temp (24hrs), Avg:98.3 F (36.8 C), Min:98 F (36.7 C), Max:98.6 F (37 C)  Recent Labs  Lab 10/24/21 0411 10/24/21 0736 10/24/21 1159 10/24/21 1508 10/25/21 0029 10/26/21 0112 10/27/21 0852 10/28/21 0317 10/29/21 0209 10/30/21 0127  WBC 11.4*  --   --   --  9.3 12.0* 9.6 11.0* 13.7* 15.3*  CREATININE 3.19*  --   --   --  4.42* 3.65* 1.90* 1.51* 1.14 1.08  LATICACIDVEN 5.1* 5.7* 5.4* 5.1* 3.0*  --   --   --   --   --     Estimated Creatinine Clearance: 73.6 mL/min (by C-G formula based on SCr of 1.08 mg/dL).    No Known Allergies  Antimicrobials this admission: Zosyn 2/12 >>2/13 CTX 2/13 >>2/14 Flagyl 2/13 >>2/14 Unasyn 2/14 >(3/27)   Dose adjustments this admission: 2/16 Unasyn  adjusted up to 3 g q6h for improved renal function.  Microbiology results: 2/12 BCx >> 4/4 [place], [facility] ([facility]ID [place], Strep Infantarius) 2/13 BCx >> no growth 2/12 MRSA PCR negative  Thank you for allowing pharmacy to be a part of this patients care.  Donald Pore 10/30/2021 10:13 AM

## 2021-10-30 NOTE — Progress Notes (Signed)
Progress Note  Patient Name: [name] [name]. Date of Encounter: 10/30/2021  [facility] Cardiologist: Nelva Bush, MD   Subjective   No palpitaitons, no significaint SOB  Inpatient Medications    Scheduled Meds:  chlorhexidine  15 mL Mouth Rinse BID   Chlorhexidine Gluconate Cloth  6 each Topical Q0600   fenofibrate  145 mg Oral Daily   insulin aspart  0-15 Units Subcutaneous TID WC   lactulose  30 g Oral Daily   mouth rinse  15 mL Mouth Rinse q12n4p   metoprolol succinate  50 mg Oral Daily   rosuvastatin  10 mg Oral Daily   sodium chloride flush  10-40 mL Intracatheter Q12H   tamsulosin  0.4 mg Oral Daily   Continuous Infusions:  ampicillin-sulbactam (UNASYN) IV     PRN Meds: docusate sodium, hydrALAZINE, melatonin, ondansetron (ZOFRAN) IV, polyethylene glycol   Vital Signs    Vitals:   10/29/21 2211 10/29/21 2332 10/30/21 0356 10/30/21 0815  BP: 134/66 131/70  (!) 141/58  Pulse: 69 67  87  Resp:  20  20  Temp:  98.6 F (37 C)  98 F (36.7 C)  TempSrc:  Oral  Oral  SpO2:  94%  93%  Weight:   121.6 kg   Height:        Intake/Output Summary (Last 24 hours) at 10/30/2021 1004 Last data filed at 10/30/2021 0400 Gross per 24 hour  Intake 220 ml  Output 100 ml  Net 120 ml   Last 3 Weights 10/30/2021 10/28/2021 10/27/2021  Weight (lbs) 268 lb 1.3 oz 264 lb 8.8 oz 261 lb 3.9 oz  Weight (kg) 121.6 kg 120 kg 118.5 kg      Telemetry    SR, PVCs. 11 beat run NSVT - Personally Reviewed  ECG    N/a - Personally Reviewed  Physical Exam   GEN: No acute distress.   Neck: No JVD Cardiac: RRR, no murmurs, rubs, or gallops.  Respiratory: Clear to auscultation bilaterally. GI: Soft, nontender, non-distended  MS: No edema; No deformity. Neuro:  Nonfocal  Psych: Normal affect   Labs    High Sensitivity Troponin:   Recent Labs  Lab 10/23/21 0334 10/23/21 0559  TROPONINIHS 68* 142*     Chemistry Recent Labs  Lab 10/26/21 0112 10/27/21 0852  10/28/21 0317 10/29/21 0209 10/30/21 0127  NA 141 144 142 141 138  K 3.6 3.1* 3.2* 3.5 3.6  CL 106 109 108 107 105  CO2 19* 21* 24 24 23   GLUCOSE 114* 139* 157* 174* 155*  BUN 71* 61* 49* 35* 25*  CREATININE 3.65* 1.90* 1.51* 1.14 1.08  CALCIUM 7.8* 8.0* 7.9* 7.8* 7.6*  MG 2.6*  --   --  1.9 1.7  PROT 5.5*  --   --  5.2* 4.9*  ALBUMIN 2.7* 2.7*  --  2.2* 2.0*  AST 357*  --   --  32 27  ALT 515*  --   --  104* 63*  ALKPHOS 141*  --   --  97 85  BILITOT 1.9*  --   --  1.8* 2.0*  GFRNONAA 16* 35* 47* >60 >60  ANIONGAP 16* 14 10 10 10     Lipids No results for input(s): CHOL, TRIG, HDL, LABVLDL, LDLCALC, CHOLHDL in the last 168 hours.  Hematology Recent Labs  Lab 10/28/21 0317 10/29/21 0209 10/30/21 0127  WBC 11.0* 13.7* 15.3*  RBC 3.76* 3.59* 3.49*  HGB 11.0* 10.1* 10.0*  HCT 32.0* 31.0* 30.7*  MCV 85.1 86.4 88.0  MCH 29.3 28.1 28.7  MCHC 34.4 32.6 32.6  RDW 16.5* 16.5* 16.9*  PLT 92* 124* 148*   Thyroid No results for input(s): TSH, FREET4 in the last 168 hours.  BNP Recent Labs  Lab 10/29/21 0209 10/30/21 0127  BNP 727.2* 394.5*    DDimer No results for input(s): DDIMER in the last 168 hours.   Radiology    DG Chest Port 1 View  Result Date: 10/28/2021 CLINICAL DATA:  Shortness of breath EXAM: PORTABLE CHEST 1 VIEW COMPARISON:  Radiograph 10/23/2021 FINDINGS: Unchanged enlarged cardiac silhouette. Prior median sternotomy and CABG. There are left basilar opacities. Small bilateral pleural effusions, left greater than right. No visible pneumothorax. No acute osseous abnormality. IMPRESSION: Small bilateral pleural effusions, left greater than right. Left basilar opacities could be atelectasis or infection. Unchanged cardiomegaly. Electronically Signed   By: Maurine Simmering M.D.   On: 10/28/2021 11:58   ECHO TEE  Result Date: 10/28/2021    TRANSESOPHOGEAL ECHO REPORT   Patient Name:   [name] [name]. Date of Exam: 10/28/2021 Medical Rec #:  [id number]             Height:       71.0 in Accession #:    [id number]           Weight:       264.6 lb Date of Birth:  07-14-1941           BSA:          2.375 m Patient Age:    81 years             BP:           169/70 mmHg Patient Gender: M                    HR:           77 bpm. Exam Location:  Inpatient Procedure: Transesophageal Echo, Cardiac Doppler and Color Doppler Indications:     Bacteremia  History:         Patient has prior history of Echocardiogram examinations, most                  recent 10/24/2021. CAD, Abnormal ECG and Prior CABG,                  Arrythmias:Atrial Fibrillation, Signs/Symptoms:Bacteremia; Risk                  Factors:Diabetes.  Sonographer:     Roseanna Rainbow RDCS Referring Phys:  [id number] [facility]ERT[facility] Diagnosing Phys: Lyman Bishop MD PROCEDURE: After discussion of the risks and benefits of a TEE, an informed consent was obtained from the patient. The transesophogeal probe was passed without difficulty through the esophogus of the patient. Imaged were obtained with the patient in a supine position. Sedation performed by different physician. The patient was monitored while under deep sedation. Anesthestetic sedation was provided intravenously by Anesthesiology: 168mg  of Propofol. The patient developed no complications during the procedure. IMPRESSIONS  1. Left ventricular ejection fraction, by estimation, is 40 to 45%. The left ventricle has mildly decreased function. The left ventricle demonstrates global hypokinesis.  2. Right ventricular systolic function is normal. The right ventricular size is normal.  3. No left atrial/left atrial appendage thrombus was detected.  4. Diffuse thickening and increased echogenicity of the anterior mitral leaflet with small mobile vegetation, consistent with endocarditis. The mitral valve is abnormal.  Mild mitral valve regurgitation.  5. Small vegetation on the mitral valve.  6. No vegetation, Lambl's noted on the leaflet tips. The aortic valve is tricuspid. There is  moderate thickening of the aortic valve. Aortic valve regurgitation is not visualized.  7. Aortic dilatation noted. There is borderline dilatation of the ascending aorta, measuring 38 mm. Conclusion(s)/Recommendation(s): Findings are concerning for vegetation/infective endocarditis as detailed above. FINDINGS  Left Ventricle: Left ventricular ejection fraction, by estimation, is 40 to 45%. The left ventricle has mildly decreased function. The left ventricle demonstrates global hypokinesis. The left ventricular internal cavity size was normal in size. Right Ventricle: The right ventricular size is normal. No increase in right ventricular wall thickness. Right ventricular systolic function is normal. Left Atrium: Left atrial size was normal in size. No left atrial/left atrial appendage thrombus was detected. Right Atrium: Right atrial size was normal in size. Pericardium: There is no evidence of pericardial effusion. Mitral Valve: Diffuse thickening and increased echogenicity of the anterior mitral leaflet with small mobile vegetation, consistent with endocarditis. The mitral valve is abnormal. A small vegetation is seen on the anterior mitral leaflet. The MV vegetation measures 5 mm x 5 mm. Mild mitral valve regurgitation. Tricuspid Valve: The tricuspid valve is grossly normal. Tricuspid valve regurgitation is trivial. Aortic Valve: No vegetation, Lambl's noted on the leaflet tips. The aortic valve is tricuspid. There is moderate thickening of the aortic valve. Aortic valve regurgitation is not visualized. Pulmonic Valve: The pulmonic valve was normal in structure. Pulmonic valve regurgitation is not visualized. Aorta: Aortic dilatation noted. There is borderline dilatation of the ascending aorta, measuring 38 mm. IAS/Shunts: No atrial level shunt detected by color flow Doppler. Lyman Bishop MD Electronically signed by Lyman Bishop MD Signature Date/Time: 10/28/2021/2:25:50 PM    Final    [place] EKG SITE  RITE  Result Date: 10/28/2021 If Site Rite image not attached, placement could not be confirmed due to current cardiac rhythm.   Cardiac Studies    Patient Profile     81 y.o. male hx of 3v CAD with CABG 2019, with post op a fib, HTN, HLD, DM-2, CKD 3, now admitted with abd pain due to biliary tract obstruction and ascending cholangitis undergoing ERCP removing 2 stones, sepsis and a fib who is being seen 10/24/2021 for the evaluation of atrial fib and new CM with EF 40-45%.  Assessment & Plan    1.Afib -on oral lopressor, hepar gtt changed to eliquis 5mg  bid as patient has completed GI procedures - tele shows he is back in SR  - transition to toprol 50mg  daily due to systolic dysfunction     2. Acute systolic heart failure - 12/4008 echo LVEF 40-45%, grade II dd, normal RV,  - new mild LV dysfunction in setting of severe sysetmic disease/sepsis.    - on toprol 50mg  daily. Severe AKI on admissoin now resolved, start entresot 24/26mg  bid. Follow bp and labs, to add aldactone and SGLT2i soon.  - do not see indication for daily BNPs, will d/c - potentially tachy mediated CM or stress induced given he presented with sepsis. would optimize medical therapy over the next 3 months and let acute systemic illness resolve then repeat echo, if ongoign sysotlic dysfunction at that time could consider ischemic testing.      3.Sepsis Ecoli and streptococcus bacteremia/Endocarditis - in setting of cholangitis - s/p ERCP - abx per ID - TEE small anterior MV leaflet vegetation, mild MR   4. History of CAD with prior  CABG     5. AKI - in setting of sepsis - has resolved  6.NSVT - in setting of prior CAD, decreased systolic dysfunction, sepsis, mildly decreaed K and Mg - continue beta blocker. Replete K to 4 and Mg to 2  - titrate beta blocker as needed.   For questions or updates, please contact [place] Please consult [email or web address] for contact info under        Signed, Carlyle Dolly, MD  10/30/2021, 10:04 AM

## 2021-10-30 NOTE — Progress Notes (Signed)
Palliative Medicine Inpatient Follow Up Note  HPI:  Per hospitalist note  --> 81 year old gentleman with a history of coronary artery disease status post CABG 2019, paroxysmal atrial fibrillation, diabetes, GERD, CKD who presented with abdominal pain, found to have ascending cholangitis and bacteremia, he was kept in ICU was seen by GI underwent ERCP with gallstone extraction, he was also seen by ID for bacteremia.  He was transferred to my service on 10/28/2021 on day 5 of his hospital admission.   Palliative care has been asked to get involved to further discuss goals of care in the setting of prolonged hospitalization, bacteremia, and overall frail physical condition.  Today's Discussion (10/30/2021):  *Please note that this is a verbal dictation therefore any spelling or grammatical errors are due to the "[place] [employer]" system interpretation.  Chart reviewed inclusive of [name], progress notes, laboratory results, and diagnostic images.   I met with [name] [name] and his spouse, [name] [name] at bedside this morning.  [name] [name] was incrementally somnolent though able to arouse when I spoke to him and fully engage in conversation.  A brief review of patient's conditions was held this morning.  His wife and I discussed the severity of endocarditis in terms of the need for prolonged antibiotics and also the likelihood of reinfection.  We reviewed that he is not someone who will get heart surgery for this.    Reviewed in the setting of his prolonged hospital stay though that he will likely be quite weak moving forward which is one of the reasons why physical therapy most likely shared the need for skilled nursing.  I was able to get [name] [name] up at bedside with his wife present.  She shares that she cannot take him home unless he can get up himself which at this point in time he is not able to do.  [name] [name] shares with me that if the medical team tries to discharge [name] [name] she would like to appeal  that.  I asked her why this is and she shares because she believes [name] [name] should have 7 days of physical therapy.  I shared with her that at rehab that is what they do typically 45 minutes of physical therapy and 45 minutes of occupational therapy ideally to get the patient to a point where they are at their prior baseline level of functioning.  [name] [name] seems more open to this now and states that [name] [name] is about 10 minutes from her and [name] [name] is another facility to consider.  We discussed that not everyone has the same experiences at skilled nursing facilities and in all honesty no 2 experiences are the same.  I shared that the point is truly to provide that physical therapy for patients to be strengthened.  I reviewed with [name] [name] that I will be present throughout today and tomorrow should any additional questions or concerns arise.  Palliative Support Provided  Objective Assessment: [name] Vitals:   10/29/21 2332 10/30/21 0815  BP: 131/70 (!) 141/58  Pulse: 67 87  Resp: 20 20  Temp: 98.6 F (37 C) 98 F (36.7 C)  SpO2: 94% 93%    Intake/Output Summary (Last 24 hours) at 10/30/2021 1056 Last data filed at 10/30/2021 0400 Gross per 24 hour  Intake 220 ml  Output 100 ml  Net 120 ml   Last Weight  Most recent update: 10/30/2021  3:57 AM    Weight  121.6 kg (268 lb 1.3 oz)  Gen:  Elderly M in NAD HEENT: moist mucous membranes CV: Regular rate and irregular rhythm PULM: ON RA breathing is even and nonlabored ABD: soft/nontender EXT: No edema  Neuro: Alert and oriented x2-3   SUMMARY OF RECOMMENDATIONS   DNAR/DNI   Will complete a MOST prior to discharge   Ongoing [name] [name] conversations - today reviewed the significance of bacterial endocarditis in the larger picture of things  Patient's spouse, [name] [name] is now considering skilled nursing interested in [name] [name] and [name] [name]   Goal: To get back to baseline and back home   TOC - OP  Palliative   Ongoing Palliative support  Total Time: 60  MDM - Moderate  ______________________________________________________________________________________ [place] Team Team Cell Phone: [phone] Please utilize secure chat with additional questions, if there is no response within 30 minutes please call the above phone number  Palliative Medicine Team providers are available by phone from 7am to 7pm daily and can be reached through the team cell phone.  Should this patient require assistance outside of these hours, please call the patient's attending physician.

## 2021-10-30 NOTE — Progress Notes (Signed)
Brief ID Note:  Patient with positive TEE for mitral valve endocarditis.  PICC line was placed for long term antibiotics which will be needed for 6 weeks from date of negative blood cultures through 3/27.   Raynelle Highland for Infectious Disease [facility] 10/30/2021, 6:44 AM

## 2021-10-30 NOTE — Progress Notes (Signed)
PROGRESS NOTE                                                                                                                                                                                                             Patient Demographics:    [name] [name], is a 81 y.o. male, DOB - 1941/09/11, YQM:[id number]  Outpatient Primary MD for the patient is Ria Bush, MD    LOS - 7  Admit date - 10/23/2021    Chief Complaint  Patient presents with   Abdominal Pain       Brief Narrative (HPI from H&P)     81 year old gentleman with a history of coronary artery disease status post CABG 2019, paroxysmal atrial fibrillation, diabetes, GERD, CKD who presented with abdominal pain, found to have ascending cholangitis and bacteremia, he was kept in ICU was seen by GI underwent ERCP with gallstone extraction, he was also seen by ID for bacteremia.  He was transferred to my service on 10/28/2021 on day 5 of his hospital admission   Subjective:   Patient in bed, appears comfortable, denies any headache, no fever, no chest pain or pressure, no shortness of breath , no abdominal pain. No new focal weakness.   Assessment  & Plan :   Sepsis and bacteremia with Streptococcus infantarius and E. Coli - caused by ascending cholangitis, had gallstones was seen by GI and he underwent ERCP with 2 gallbladder stones removed with sphincterotomy on 10/23/2021, seen by ID currently on IV antibiotics, TTE was stable unfortunately TEE done on 10/28/2021 does confirm mitral valve endocarditis, case discussed with ID PICC line placed on 10/28/2021 for prolonged IV antibiotics.  Thankfully sepsis pathophysiology has resolved and CRP and procalcitonin are trending down.  He is currently on Unasyn x 6 weeks - ID.  2.  AKI on CKD 3B.  Seen by nephrology, AKI due to ATN from sepsis.  Improved after hydration and hemodynamic stability.  Creatinine peaked at 4.5 now  down to 1.5.  3.   Elevated LFTs due to #1 coming down.  4.  Acute drop in systolic function of heart with EF dropping to 45% down from 55% few years ago.  Likely due to sepsis.  Currently on beta-blocker, seen by cardiology.  Avoiding ACE/ARB due to renal failure.  Compensated from this fluid standpoint.  Outpatient cardiology  follow-up post discharge.  5.  Paroxysmal atrial fibrillation with [place] vas 2 score of greater than 3.  On combination of Lopressor and full dose Lovenox which we will hold on 10/30/2021 due to PICC line site hematoma, resume Eliquis on 10/31/2021.  6.  Severe of weakness and deconditioning.  PT OT and monitor.  May require SNF.  7.  Hypokalemia.  Replaced.  8.  BPH.  On Flomax continue.  9.  DM type II.  For now sliding scale.  A1c satisfactory at 6.1  CBG (last 3)  Recent Labs    10/29/21 1656 10/29/21 2025 10/30/21 0815  GLUCAP 173* 152* 182*         Condition - Extremely Guarded  Family Communication  :  wife Barnetta Chapel [phone] - bedside 10/28/21, phone 10/30/21  Code Status :  Full  Consults  :  PCCM, ID, Cards, Pall. Care  PUD Prophylaxis :    Procedures  :     CT Head - non acute  ERCP with removal of 2 gallbladder stones and sphincterotomy on 10/23/2021.    CT Abd & Pelvis - Chest -  1. No evidence of acute aortic syndrome. 2. No acute findings in the thorax to account for the patient's symptoms. 3. 5 mm calculus at the right ureteropelvic junction. At this time, there is no proximal hydronephrosis to indicate urinary tract obstruction. 4. Cholelithiasis without evidence of acute cholecystitis. 5. Dilatation of the common bile duct. No intrahepatic biliary ductal dilatation to clearly indicate biliary tract obstruction. Additionally, there is no calcified choledocholithiasis. This is of uncertain etiology and significance, and could be age related, however, correlation with liver function tests is recommended. If there is clinical  concern for biliary tract obstruction, further evaluation with abdominal MRI with and without IV gadolinium with MRCP would be recommended. 6. Aortic atherosclerosis, in addition to left main and three-vessel coronary artery disease. Status post median sternotomy for CABG including LIMA to the LAD. 7. There are calcifications of the aortic valve. Echocardiographic correlation for evaluation of potential valvular dysfunction may be warranted if clinically indicated. 8. Additional incidental findings, as above.  TTE -  EF 45% with global hypokinesis.  TEE -   Suspected endocarditis of the mitral valve with increased echogenicty, thickening and hockystick motion as well as a small mobile vegetation. There is mild MR. Findings are new compared to TEE in 2019 which was personally reviewed. Dilated ascending aorta to 38 mm. Thickening of the aortic valve leaflets without mobile vegetation. LVEF 40-45%, global hypokinesis. Pre-op EKG demonstrated the patient was back in sinus rhythm with PVC's, therefore, cardioversion was not pursued.      Disposition Plan  :    Status is: Inpatient  DVT Prophylaxis  :  Lovenox  SCDs Start: 10/23/21 4562  Lab Results  Component Value Date   PLT 148 (L) 10/30/2021    Diet :  Diet Order             DIET SOFT Room service appropriate? Yes; Fluid consistency: Nectar Thick  Diet effective now                    Inpatient Medications  Scheduled Meds:  chlorhexidine  15 mL Mouth Rinse BID   Chlorhexidine Gluconate Cloth  6 each Topical Q0600   enoxaparin (LOVENOX) injection  1 mg/kg Subcutaneous Q12H   insulin aspart  0-15 Units Subcutaneous TID WC   lactulose  30 g Oral Daily   mouth rinse  15 mL Mouth Rinse q12n4p   metoprolol succinate  50 mg Oral Daily   sodium chloride flush  10-40 mL Intracatheter Q12H   tamsulosin  0.4 mg Oral Daily   Continuous Infusions:  ampicillin-sulbactam (UNASYN) IV 3 g (10/30/21 0923)   PRN Meds:.docusate  sodium, hydrALAZINE, melatonin, ondansetron (ZOFRAN) IV, polyethylene glycol    Time Spent in minutes  30   Lala Lund M.D on 10/30/2021 at 9:38 AM  To page go to [email or web address]   [facility] -  Office  [phone]  See all Orders from today for further details    Objective:   Vitals:   10/29/21 2211 10/29/21 2332 10/30/21 0356 10/30/21 0815  BP: 134/66 131/70  (!) 141/58  Pulse: 69 67  87  Resp:  20  20  Temp:  98.6 F (37 C)  98 F (36.7 C)  TempSrc:  Oral  Oral  SpO2:  94%  93%  Weight:   121.6 kg   Height:        Wt Readings from Last 3 Encounters:  10/30/21 121.6 kg  06/07/21 118 kg  01/28/21 118.8 kg     Intake/Output Summary (Last 24 hours) at 10/30/2021 7253 Last data filed at 10/30/2021 0400 Gross per 24 hour  Intake 220 ml  Output 100 ml  Net 120 ml     Physical Exam  Awake Alert, No new F.N deficits, Normal affect [place].AT,PERRAL Supple Neck, No JVD,   Symmetrical Chest wall movement, Good air movement bilaterally, CTAB RRR,No Gallops, Rubs or new Murmurs,  +ve B.Sounds, Abd Soft, No tenderness,   Left arm PICC line with a large bruise around the site    Data Review:    CBC Recent Labs  Lab 10/23/21 2154 10/24/21 0411 10/25/21 0029 10/26/21 0112 10/27/21 6644 10/28/21 0317 10/29/21 0209 10/30/21 0127  WBC 16.2*   < > 9.3 12.0* 9.6 11.0* 13.7* 15.3*  HGB 9.5*   < > 11.1* 10.2* 10.8* 11.0* 10.1* 10.0*  HCT 30.6*   < > 33.7* 31.2* 31.7* 32.0* 31.0* 30.7*  PLT 96*   < > 63* 72* 70* 92* 124* 148*  MCV 95.3   < > 89.2 88.9 85.4 85.1 86.4 88.0  MCH 29.6   < > 29.4 29.1 29.1 29.3 28.1 28.7  MCHC 31.0   < > 32.9 32.7 34.1 34.4 32.6 32.6  RDW 15.7*   < > 15.8* 15.9* 16.1* 16.5* 16.5* 16.9*  LYMPHSABS 0.7  --  0.4*  --   --   --  1.6 1.9  MONOABS 0.7  --  0.4  --   --   --  1.1* 0.9  EOSABS 0.0  --  0.0  --   --   --  0.2 0.2  BASOSABS 0.0  --  0.0  --   --   --  0.1 0.0   < > = values in this interval not displayed.     Electrolytes Recent Labs  Lab 10/24/21 0411 10/24/21 0736 10/24/21 1159 10/24/21 1508 10/24/21 2226 10/25/21 0029 10/26/21 0112 10/26/21 0811 10/27/21 0852 10/28/21 0317 10/29/21 0209 10/30/21 0127  NA 138  --   --   --    < > 140 141  --  144 142 141 138  K 3.7  --   --   --    < > 4.4 3.6  --  3.1* 3.2* 3.5 3.6  CL 99  --   --   --   --  102  106  --  109 108 107 105  CO2 23  --   --   --   --  21* 19*  --  21* 24 24 23   GLUCOSE 374*  --   --   --   --  96 114*  --  139* 157* 174* 155*  BUN 37*  --   --   --   --  57* 71*  --  61* 49* 35* 25*  CREATININE 3.19*  --   --   --   --  4.42* 3.65*  --  1.90* 1.51* 1.14 1.08  CALCIUM 7.1*  --   --   --   --  8.4* 7.8*  --  8.0* 7.9* 7.8* 7.6*  AST 1,557*  --   --   --   --  1,104* 357*  --   --   --  32 27  ALT 849*  --   --   --   --  905* 515*  --   --   --  104* 63*  ALKPHOS 106  --   --   --   --  150* 141*  --   --   --  97 85  BILITOT 3.7*  --   --   --   --  3.5* 1.9*  --   --   --  1.8* 2.0*  ALBUMIN 2.3*  --   --   --   --  2.6* 2.7*  --  2.7*  --  2.2* 2.0*  MG 1.6*  --   --   --   --  2.5* 2.6*  --   --   --  1.9 1.7  CRP  --   --   --   --   --   --   --   --   --  25.4* 16.7* 14.2*  PROCALCITON  --   --   --   --   --   --   --   --   --  13.32 5.90 3.48  LATICACIDVEN 5.1* 5.7* 5.4* 5.1*  --  3.0*  --   --   --   --   --   --   AMMONIA  --   --   --   --   --   --   --  26  --   --   --   --   BNP  --   --   --   --   --   --   --   --   --   --  727.2* 394.5*   < > = values in this interval not displayed.    ------------------------------------------------------------------------------------------------------------------ No results for input(s): CHOL, HDL, LDLCALC, TRIG, CHOLHDL, LDLDIRECT in the last 72 hours.  Lab Results  Component Value Date   HGBA1C 6.1 (H) 10/23/2021    No results for input(s): TSH, T4TOTAL, T3FREE, THYROIDAB in the last 72 hours.  Invalid input(s):  FREET3 ------------------------------------------------------------------------------------------------------------------ ID Labs Recent Labs  Lab 10/24/21 0411 10/24/21 0736 10/24/21 1159 10/24/21 1508 10/25/21 0029 10/26/21 0112 10/27/21 0254 10/28/21 0317 10/29/21 0209 10/30/21 0127  WBC 11.4*  --   --   --  9.3 12.0* 9.6 11.0* 13.7* 15.3*  PLT 75*  --   --   --  63* 72* 70* 92* 124* 148*  CRP  --   --   --   --   --   --   --  25.4* 16.7* 14.2*  PROCALCITON  --   --   --   --   --   --   --  13.32 5.90 3.48  LATICACIDVEN 5.1* 5.7* 5.4* 5.1* 3.0*  --   --   --   --   --   CREATININE 3.19*  --   --   --  4.42* 3.65* 1.90* 1.51* 1.14 1.08   Cardiac Enzymes No results for input(s): CKMB, TROPONINI, MYOGLOBIN in the last 168 hours.  Invalid input(s): CK   Radiology Reports DG Chest Port 1 View  Result Date: 10/28/2021 CLINICAL DATA:  Shortness of breath EXAM: PORTABLE CHEST 1 VIEW COMPARISON:  Radiograph 10/23/2021 FINDINGS: Unchanged enlarged cardiac silhouette. Prior median sternotomy and CABG. There are left basilar opacities. Small bilateral pleural effusions, left greater than right. No visible pneumothorax. No acute osseous abnormality. IMPRESSION: Small bilateral pleural effusions, left greater than right. Left basilar opacities could be atelectasis or infection. Unchanged cardiomegaly. Electronically Signed   By: Maurine Simmering M.D.   On: 10/28/2021 11:58   ECHO TEE  Result Date: 10/28/2021    TRANSESOPHOGEAL ECHO REPORT   Patient Name:   Alieu Finnigan. Date of Exam: 10/28/2021 Medical Rec #:  [id number]            Height:       71.0 in Accession #:    [id number]           Weight:       264.6 lb Date of Birth:  Sep 04, 1941           BSA:          2.375 m Patient Age:    37 years             BP:           169/70 mmHg Patient Gender: M                    HR:           77 bpm. Exam Location:  Inpatient Procedure: Transesophageal Echo, Cardiac Doppler and Color Doppler  Indications:     Bacteremia  History:         Patient has prior history of Echocardiogram examinations, most                  recent 10/24/2021. CAD, Abnormal ECG and Prior CABG,                  Arrythmias:Atrial Fibrillation, Signs/Symptoms:Bacteremia; Risk                  Factors:Diabetes.  Sonographer:     Roseanna Rainbow RDCS Referring Phys:  [id number] [employer] B ROBERTS Diagnosing Phys: Lyman Bishop MD PROCEDURE: After discussion of the risks and benefits of a TEE, an informed consent was obtained from the patient. The transesophogeal probe was passed without difficulty through the esophogus of the patient. Imaged were obtained with the patient in a supine position. Sedation performed by different physician. The patient was monitored while under deep sedation. Anesthestetic sedation was provided intravenously by Anesthesiology: 168mg  of Propofol. The patient developed no complications during the procedure. IMPRESSIONS  1. Left ventricular ejection fraction, by estimation, is 40 to 45%. The left ventricle has mildly decreased function. The left ventricle demonstrates global hypokinesis.  2. Right ventricular systolic function is normal. The right ventricular size is normal.  3. No left atrial/left atrial appendage thrombus was detected.  4. Diffuse  thickening and increased echogenicity of the anterior mitral leaflet with small mobile vegetation, consistent with endocarditis. The mitral valve is abnormal. Mild mitral valve regurgitation.  5. Small vegetation on the mitral valve.  6. No vegetation, Lambl's noted on the leaflet tips. The aortic valve is tricuspid. There is moderate thickening of the aortic valve. Aortic valve regurgitation is not visualized.  7. Aortic dilatation noted. There is borderline dilatation of the ascending aorta, measuring 38 mm. Conclusion(s)/Recommendation(s): Findings are concerning for vegetation/infective endocarditis as detailed above. FINDINGS  Left Ventricle: Left ventricular ejection  fraction, by estimation, is 40 to 45%. The left ventricle has mildly decreased function. The left ventricle demonstrates global hypokinesis. The left ventricular internal cavity size was normal in size. Right Ventricle: The right ventricular size is normal. No increase in right ventricular wall thickness. Right ventricular systolic function is normal. Left Atrium: Left atrial size was normal in size. No left atrial/left atrial appendage thrombus was detected. Right Atrium: Right atrial size was normal in size. Pericardium: There is no evidence of pericardial effusion. Mitral Valve: Diffuse thickening and increased echogenicity of the anterior mitral leaflet with small mobile vegetation, consistent with endocarditis. The mitral valve is abnormal. A small vegetation is seen on the anterior mitral leaflet. The MV vegetation measures 5 mm x 5 mm. Mild mitral valve regurgitation. Tricuspid Valve: The tricuspid valve is grossly normal. Tricuspid valve regurgitation is trivial. Aortic Valve: No vegetation, Lambl's noted on the leaflet tips. The aortic valve is tricuspid. There is moderate thickening of the aortic valve. Aortic valve regurgitation is not visualized. Pulmonic Valve: The pulmonic valve was normal in structure. Pulmonic valve regurgitation is not visualized. Aorta: Aortic dilatation noted. There is borderline dilatation of the ascending aorta, measuring 38 mm. IAS/Shunts: No atrial level shunt detected by color flow Doppler. Lyman Bishop MD Electronically signed by Lyman Bishop MD Signature Date/Time: 10/28/2021/2:25:50 PM    Final    [place] EKG SITE RITE  Result Date: 10/28/2021 If Site Rite image not attached, placement could not be confirmed due to current cardiac rhythm.

## 2021-10-31 ENCOUNTER — Inpatient Hospital Stay (HOSPITAL_COMMUNITY): Payer: Medicare PPO

## 2021-10-31 ENCOUNTER — Encounter: Payer: Self-pay | Admitting: Internal Medicine

## 2021-10-31 DIAGNOSIS — I4891 Unspecified atrial fibrillation: Secondary | ICD-10-CM | POA: Diagnosis not present

## 2021-10-31 DIAGNOSIS — R7989 Other specified abnormal findings of blood chemistry: Secondary | ICD-10-CM | POA: Diagnosis not present

## 2021-10-31 DIAGNOSIS — I472 Ventricular tachycardia, unspecified: Secondary | ICD-10-CM | POA: Diagnosis not present

## 2021-10-31 DIAGNOSIS — R7881 Bacteremia: Secondary | ICD-10-CM | POA: Diagnosis not present

## 2021-10-31 DIAGNOSIS — K8033 Calculus of bile duct with acute cholangitis with obstruction: Secondary | ICD-10-CM | POA: Diagnosis not present

## 2021-10-31 DIAGNOSIS — N179 Acute kidney failure, unspecified: Secondary | ICD-10-CM | POA: Diagnosis not present

## 2021-10-31 DIAGNOSIS — I5021 Acute systolic (congestive) heart failure: Secondary | ICD-10-CM | POA: Diagnosis not present

## 2021-10-31 LAB — CBC WITH DIFFERENTIAL/PLATELET
Abs Immature Granulocytes: 0.5 10*3/uL — ABNORMAL HIGH (ref 0.00–0.07)
Basophils Absolute: 0 10*3/uL (ref 0.0–0.1)
Basophils Relative: 0 %
Eosinophils Absolute: 0.2 10*3/uL (ref 0.0–0.5)
Eosinophils Relative: 1 %
HCT: 28.5 % — ABNORMAL LOW (ref 39.0–52.0)
Hemoglobin: 9.3 g/dL — ABNORMAL LOW (ref 13.0–17.0)
Immature Granulocytes: 4 %
Lymphocytes Relative: 13 %
Lymphs Abs: 1.8 10*3/uL (ref 0.7–4.0)
MCH: 28.9 pg (ref 26.0–34.0)
MCHC: 32.6 g/dL (ref 30.0–36.0)
MCV: 88.5 fL (ref 80.0–100.0)
Monocytes Absolute: 0.8 10*3/uL (ref 0.1–1.0)
Monocytes Relative: 5 %
Neutro Abs: 11.2 10*3/uL — ABNORMAL HIGH (ref 1.7–7.7)
Neutrophils Relative %: 77 %
Platelets: 184 10*3/uL (ref 150–400)
RBC: 3.22 MIL/uL — ABNORMAL LOW (ref 4.22–5.81)
RDW: 17.1 % — ABNORMAL HIGH (ref 11.5–15.5)
WBC: 14.4 10*3/uL — ABNORMAL HIGH (ref 4.0–10.5)
nRBC: 0 % (ref 0.0–0.2)

## 2021-10-31 LAB — COMPREHENSIVE METABOLIC PANEL
ALT: 46 U/L — ABNORMAL HIGH (ref 0–44)
AST: 24 U/L (ref 15–41)
Albumin: 1.9 g/dL — ABNORMAL LOW (ref 3.5–5.0)
Alkaline Phosphatase: 79 U/L (ref 38–126)
Anion gap: 8 (ref 5–15)
BUN: 22 mg/dL (ref 8–23)
CO2: 25 mmol/L (ref 22–32)
Calcium: 7.7 mg/dL — ABNORMAL LOW (ref 8.9–10.3)
Chloride: 105 mmol/L (ref 98–111)
Creatinine, Ser: 1.11 mg/dL (ref 0.61–1.24)
GFR, Estimated: >60 mL/min (ref >60)
Glucose, Bld: 161 mg/dL — ABNORMAL HIGH (ref 70–99)
Potassium: 3.6 mmol/L (ref 3.5–5.1)
Sodium: 138 mmol/L (ref 135–145)
Total Bilirubin: 1.3 mg/dL — ABNORMAL HIGH (ref 0.3–1.2)
Total Protein: 4.8 g/dL — ABNORMAL LOW (ref 6.5–8.1)

## 2021-10-31 LAB — URINALYSIS, ROUTINE W REFLEX MICROSCOPIC
Bilirubin Urine: NEGATIVE
Glucose, UA: NEGATIVE mg/dL
Hgb urine dipstick: NEGATIVE
Ketones, ur: NEGATIVE mg/dL
Leukocytes,Ua: NEGATIVE
Nitrite: NEGATIVE
Protein, ur: NEGATIVE mg/dL
Specific Gravity, Urine: 1.021 (ref 1.005–1.030)
pH: 5 (ref 5.0–8.0)

## 2021-10-31 LAB — GLUCOSE, CAPILLARY
Glucose-Capillary: 154 mg/dL — ABNORMAL HIGH (ref 70–99)
Glucose-Capillary: 129 mg/dL — ABNORMAL HIGH (ref 70–99)
Glucose-Capillary: 167 mg/dL — ABNORMAL HIGH (ref 70–99)
Glucose-Capillary: 153 mg/dL — ABNORMAL HIGH (ref 70–99)

## 2021-10-31 LAB — RESP PANEL BY RT-PCR (FLU A&B, COVID) ARPGX2
Influenza A by PCR: NEGATIVE
Influenza B by PCR: NEGATIVE
SARS Coronavirus 2 by RT PCR: NEGATIVE

## 2021-10-31 LAB — C-REACTIVE PROTEIN: CRP: 14.5 mg/dL — ABNORMAL HIGH (ref <1.0)

## 2021-10-31 LAB — MAGNESIUM: Magnesium: 2 mg/dL (ref 1.7–2.4)

## 2021-10-31 LAB — PROCALCITONIN: Procalcitonin: 2.26 ng/mL

## 2021-10-31 MED ORDER — IBUPROFEN 400 MG PO TABS
400.0000 mg | ORAL_TABLET | ORAL | Status: AC
  Administered 2021-10-31: 400 mg via ORAL
  Filled 2021-10-31: qty 1

## 2021-10-31 MED ORDER — SODIUM CHLORIDE 0.9 % IV SOLN: 3.0000 g | Freq: Four times a day (QID) | INTRAVENOUS | Status: DC

## 2021-10-31 MED ORDER — VANCOMYCIN HCL 2000 MG/400ML IV SOLN
2000.0000 mg | Freq: Once | INTRAVENOUS | Status: AC
  Administered 2021-10-31: 2000 mg via INTRAVENOUS
  Filled 2021-10-31: qty 400

## 2021-10-31 MED ORDER — CEFAZOLIN SODIUM-DEXTROSE 2-4 GM/100ML-% IV SOLN: 2.0000 g | Freq: Three times a day (TID) | INTRAVENOUS | Status: DC

## 2021-10-31 MED ORDER — SODIUM CHLORIDE 0.9 % IV SOLN
3.0000 g | Freq: Four times a day (QID) | INTRAVENOUS | Status: DC
  Administered 2021-10-31 – 2021-11-02 (×8): 3 g via INTRAVENOUS
  Filled 2021-10-31 (×8): qty 8

## 2021-10-31 MED ORDER — DAPAGLIFLOZIN PROPANEDIOL 5 MG PO TABS
5.0000 mg | ORAL_TABLET | Freq: Every day | ORAL | Status: DC
  Administered 2021-10-31 – 2021-11-05 (×6): 5 mg via ORAL
  Filled 2021-10-31 (×6): qty 1

## 2021-10-31 MED ORDER — POTASSIUM CHLORIDE CRYS ER 20 MEQ PO TBCR
20.0000 meq | EXTENDED_RELEASE_TABLET | Freq: Every day | ORAL | Status: DC
  Administered 2021-10-31 – 2021-11-05 (×6): 20 meq via ORAL
  Filled 2021-10-31 (×6): qty 1

## 2021-10-31 MED ORDER — VANCOMYCIN HCL 1500 MG/300ML IV SOLN
1500.0000 mg | INTRAVENOUS | Status: DC
  Administered 2021-11-01: 1500 mg via INTRAVENOUS
  Filled 2021-10-31 (×2): qty 300

## 2021-10-31 NOTE — Progress Notes (Addendum)
PHARMACY CONSULT NOTE FOR:  OUTPATIENT  PARENTERAL ANTIBIOTIC THERAPY (OPAT)  Indication: MV endocarditis Regimen: Cefazolin 2g IV every 8 hours End date: 12/05/21  IV antibiotic discharge orders are pended. To discharging provider:  please sign these orders via discharge navigator,  Select New Orders & click on the button choice - Manage This Unsigned Work.     Thank you for allowing pharmacy to be a part of this patients care.  Alycia Rossetti, PharmD, BCPS Clinical Pharmacist 11/02/2021 11:57 AM   **Pharmacist phone directory can now be found on [email or web address] ([email or web address]).  Listed under [place].

## 2021-10-31 NOTE — Progress Notes (Signed)
Progress Note  Patient Name: [name] Young. Date of Encounter: 10/31/2021  [facility] Cardiologist: Nelva Bush, MD   Subjective   Wife is present, she wants to know if he has pneumonia, discussed this with her.  He has scales at home and can weigh daily, but has not been doing so.  He was not aware of the atrial fibrillation, denies palpitations.  Denies shortness of breath, but has not been out of bed.  Wife is frustrated because he got Ativan that made him sleep for 2 days, she feels that he might not need to go to a rehab facility if that were the case  Inpatient Medications    Scheduled Meds:  apixaban  5 mg Oral BID   chlorhexidine  15 mL Mouth Rinse BID   Chlorhexidine Gluconate Cloth  6 each Topical Q0600   fenofibrate  160 mg Oral Daily   insulin aspart  0-15 Units Subcutaneous TID WC   lactulose  30 g Oral Daily   mouth rinse  15 mL Mouth Rinse q12n4p   metoprolol succinate  50 mg Oral Daily   rosuvastatin  10 mg Oral Daily   sacubitril-valsartan  1 tablet Oral BID   sodium chloride flush  10-40 mL Intracatheter Q12H   tamsulosin  0.4 mg Oral Daily   Continuous Infusions:  ampicillin-sulbactam (UNASYN) IV 3 g (10/31/21 0340)   PRN Meds: docusate sodium, hydrALAZINE, melatonin, ondansetron (ZOFRAN) IV, polyethylene glycol   Vital Signs    Vitals:   10/30/21 0815 10/30/21 1929 10/31/21 0000 10/31/21 0400  BP: (!) 141/58 (!) 135/59 132/62 (!) 144/51  Pulse: 87 78 79 79  Resp: 20 20 20  (!) 21  Temp: 98 F (36.7 C) 99.6 F (37.6 C) 98.9 F (37.2 C) 98.8 F (37.1 C)  TempSrc: Oral Oral Oral Oral  SpO2: 93% 95% 94% 93%  Weight:      Height:        Intake/Output Summary (Last 24 hours) at 10/31/2021 0721 Last data filed at 10/31/2021 0600 Gross per 24 hour  Intake 0 ml  Output 800 ml  Net -800 ml   Last 3 Weights 10/30/2021 10/28/2021 10/27/2021  Weight (lbs) 268 lb 1.3 oz 264 lb 8.8 oz 261 lb 3.9 oz  Weight (kg) 121.6 kg 120 kg 118.5 kg       Telemetry    Sinus rhythm, frequent PVCs up to 3 beats at a time- Personally Reviewed  ECG    N/a - Personally Reviewed  Physical Exam   GEN: No acute distress.   Neck:  JVD 10 cm Cardiac: RRR, no murmur, no rubs, or gallops.  Respiratory: diminished to auscultation bilaterally with rales in the bases. GI: Soft, nontender, non-distended  MS: No edema; No deformity. Neuro:  Nonfocal  Psych: Normal affect   Labs    High Sensitivity Troponin:   Recent Labs  Lab 10/23/21 0334 10/23/21 0559  TROPONINIHS 68* 142*     Chemistry Recent Labs  Lab 10/29/21 0209 10/30/21 0127 10/31/21 0145  NA 141 138 138  K 3.5 3.6 3.6  CL 107 105 105  CO2 24 23 25   GLUCOSE 174* 155* 161*  BUN 35* 25* 22  CREATININE 1.14 1.08 1.11  CALCIUM 7.8* 7.6* 7.7*  MG 1.9 1.7 2.0  PROT 5.2* 4.9* 4.8*  ALBUMIN 2.2* 2.0* 1.9*  AST 32 27 24  ALT 104* 63* 46*  ALKPHOS 97 85 79  BILITOT 1.8* 2.0* 1.3*  GFRNONAA >60 >60 >60  ANIONGAP 10 10 8     Lipids No results for input(s): CHOL, TRIG, HDL, LABVLDL, LDLCALC, CHOLHDL in the last 168 hours.  Hematology Recent Labs  Lab 10/29/21 0209 10/30/21 0127 10/31/21 0145  WBC 13.7* 15.3* 14.4*  RBC 3.59* 3.49* 3.22*  HGB 10.1* 10.0* 9.3*  HCT 31.0* 30.7* 28.5*  MCV 86.4 88.0 88.5  MCH 28.1 28.7 28.9  MCHC 32.6 32.6 32.6  RDW 16.5* 16.9* 17.1*  PLT 124* 148* 184   Thyroid No results for input(s): TSH, FREET4 in the last 168 hours.  BNP Recent Labs  Lab 10/29/21 0209 10/30/21 0127  BNP 727.2* 394.5*    DDimer No results for input(s): DDIMER in the last 168 hours.   Lab Results  Component Value Date   TSH 5.764 (H) 01/23/2018   No results found for: [facility]   Radiology    No results found.  Cardiac Studies   TEE: 10/28/2021  1. Left ventricular ejection fraction, by estimation, is 40 to 45%. The  left ventricle has mildly decreased function. The left ventricle  demonstrates global hypokinesis.   2. Right ventricular  systolic function is normal. The right ventricular  size is normal.   3. No left atrial/left atrial appendage thrombus was detected.   4. Diffuse thickening and increased echogenicity of the anterior mitral leaflet with small mobile vegetation, consistent with endocarditis. The mitral valve is abnormal. Mild mitral valve regurgitation.   5. Small vegetation on the mitral valve.   6. No vegetation, Lambl's noted on the leaflet tips. The aortic valve is tricuspid. There is moderate thickening of the aortic valve. Aortic valve regurgitation is not visualized.   7. Aortic dilatation noted. There is borderline dilatation of the  ascending aorta, measuring 38 mm.   Conclusion(s)/Recommendation(s): Findings are concerning for  vegetation/infective endocarditis as detailed above  ECHO: 10/23/2021  1. Left ventricular ejection fraction, by estimation, is 40 to 45%. The  left ventricle has mildly decreased function. The left ventricle  demonstrates global hypokinesis. Left ventricular diastolic parameters are consistent with Grade II diastolic dysfunction (pseudonormalization). Elevated left ventricular end-diastolic  pressure.   2. Right ventricular systolic function is normal. The right ventricular  size is normal. There is normal pulmonary artery systolic pressure. The estimated right ventricular systolic pressure is 51.7 mmHg.   3. Left atrial size was mildly dilated.   4. The mitral valve is degenerative. Trivial mitral valve regurgitation.  No evidence of mitral stenosis.   5. The aortic valve is calcified. There is moderate calcification of the  aortic valve. There is moderate thickening of the aortic valve. Aortic  valve regurgitation is not visualized. Aortic valve  sclerosis/calcification is present, without any evidence  of aortic stenosis.   6. There is mild dilatation of the aortic root, measuring 41 mm. There is mild dilatation of the ascending aorta, measuring 38 mm.   7. The inferior  vena cava is dilated in size with >50% respiratory  variability, suggesting right atrial pressure of 8 mmHg.   Patient Profile     81 y.o. male hx of 3v CAD with CABG 2019, with post op a fib, HTN, HLD, DM-2, CKD 3, now admitted with abd pain due to biliary tract obstruction and ascending cholangitis undergoing ERCP removing 2 stones, sepsis and a fib who is being seen 10/24/2021 for the evaluation of atrial fib and new CM with EF 40-45%.  Assessment & Plan    1.Afib -S/p TEE 10/28/2021, that spontaneously converted to sinus rhythm, but endocarditis  suspected due to vegetation on the mitral valve - Maintaining sinus rhythm with frequent ventricular ectopy, pairs and salvos - Heparin->>Eliquis since GI procedures completed -Continue Toprol-XL 50 mg daily -BP/heart rate tolerating the beta-blocker  2. Acute systolic heart failure -Echo results above, EF 40-45% with grade 2 DD -In the setting of biliary tract obstruction, sepsis and rapid A-fib -Continue beta-blocker and Entresto 24-26. -Feel he will tolerate [place], add today. - I/O are +2.5 L Since admission, but are incomplete, weight is up 8 pounds -He got Lasix 80 mg once on 2/13 and 14, he is not currently on a standing dose of Lasix, oral or IV - He was not on a diuretic at home - Discuss giving IV Lasix with MD -Follow-up as outpatient, recheck echo and then decide on ischemic eval - His albumin is 1.9, he is at high likelihood to develop volume overload   3.Sepsis Ecoli and streptococcus bacteremia/Endocarditis - in setting of cholangitis - s/p ERCP - abx per ID - TEE small anterior MV leaflet vegetation, mild MR   4. History of CAD with prior CABG -No ischemic symptoms, on beta-blocker and home lipid meds - Prior to admission was on aspirin, he is off that now   5. AKI - in setting of sepsis - has resolved  6.NSVT -Ventricular ectopy is improved on beta-blocker but he still having short runs - He got IV mag and his  level improved - Potassium is still 3.6, supplement  For questions or updates, please contact [place] Please consult [email or web address] for contact info under        Signed, Rosaria Ferries, PA-C  10/31/2021, 7:21 AM

## 2021-10-31 NOTE — Progress Notes (Addendum)
Palliative Medicine Inpatient Follow Up Note  HPI:  Per hospitalist note  --> 81 year old gentleman with a history of coronary artery disease status post CABG 2019, paroxysmal atrial fibrillation, diabetes, GERD, CKD who presented with abdominal pain, found to have ascending cholangitis and bacteremia, he was kept in ICU was seen by GI underwent ERCP with gallstone extraction, he was also seen by ID for bacteremia.  He was transferred to my service on 10/28/2021 on day 5 of his hospital admission.   Palliative care has been asked to get involved to further discuss goals of care in the setting of prolonged hospitalization, bacteremia, and overall frail physical condition.  Today's Discussion (10/31/2021):  *Please note that this is a verbal dictation therefore any spelling or grammatical errors are due to the "[place]" system interpretation.  Chart reviewed inclusive of vital signs, progress notes, laboratory results, and diagnostic images.   I met with [name] Young, his spouse, [name] Young, and his daughter, [name] Young at bedside.  They were speaking to the unit director when I arrived.  [name] Young shares a variety of concerns pertaining to the patient's care.  I was able to listen to her concerns with an empathetic year.  There is a lot of concern this morning as patient was rigors and is febrile at the time I am but at bedside.  Reviewed that sepsis can be very dangerous and can result in death which [name] Young had shared with patient's wife yesterday.  Reviewed that all of the appropriate diagnostic tests had been ordered.  I asked if [name] Young and [name] Young had been provided a prognosis in the setting of patients endocarditis. [name] Young shares she asked the cardiologist who feels that [name] Young has a fair EF and that will likely not lead to his death. They did not get an impression of his  I was able to again discuss CODE STATUS and confirmed patient's DO NOT RESUSCITATE, patient's daughter was not aware  of this though assured the patient had wanted this when it spoken to him 2 days ago.  A copy of patient's living will and desire of natural death were also obtained.  Palliative Support Provided  Objective Assessment: Vital Signs Vitals:   10/31/21 1133 10/31/21 1206  BP:  (!) 154/47  Pulse:  100  Resp:  (!) 24  Temp: 98.7 F (37.1 C) (!) 102.1 F (38.9 C)  SpO2:  92%    Intake/Output Summary (Last 24 hours) at 10/31/2021 1244 Last data filed at 10/31/2021 5003 Gross per 24 hour  Intake 240 ml  Output 800 ml  Net -560 ml    Last Weight  Most recent update: 10/30/2021  3:57 AM    Weight  121.6 kg (268 lb 1.3 oz)            Gen:  Elderly M in NAD HEENT: moist mucous membranes CV: Regular rate and irregular rhythm PULM: ON RA breathing is even and nonlabored ABD: soft/nontender EXT: No edema  Neuro: Alert and oriented x2-3   SUMMARY OF RECOMMENDATIONS   DNAR/DNI   Will complete a MOST prior to discharge   Ongoing [place] conversations - today reviewed the significance of bacterial endocarditis in the larger picture of things  Patient's spouse, [name] Young is now considering skilled nursing interested in [facility] and [place]   Goal: To get back to baseline and back home   TOC - OP Palliative at the time of discharge   Ongoing incremental Palliative support - Please call the team phone if  support is needed  MDM - High ______________________________________________________________________________________ [place] Team Team Cell Phone: [phone] Please utilize secure chat with additional questions, if there is no response within 30 minutes please call the above phone number  Palliative Medicine Team providers are available by phone from 7am to 7pm daily and can be reached through the team cell phone.  Should this patient require assistance outside of these hours, please call the patient's attending  physician.

## 2021-10-31 NOTE — Progress Notes (Signed)
Called phlebotomist to come draw stat blood cultures ordered by Dr. Candiss Norse. Phlebotomist stated she'd be up shortly.

## 2021-10-31 NOTE — Progress Notes (Addendum)
[facility]  Date of Admission:  10/23/2021           Reason for visit: Follow up on bacteremia  Current antibiotics: Unasyn 2/14 -- present   Previous antibiotics: Pip Tazo 2/12 x 1 dose Ceftriaxone 2/12 -- 2/14 Metronidazole 2/12 -- 2/14  ASSESSMENT:    81 y.o. male admitted with:  Streptococcus infantarius and E. coli bacteremia with mitral valve endocarditis: Secondary to cholangitis status post ERCP with gastroenterology and removal of gallstones.  Repeat blood cultures negative from 10/24/2021 and has had steady improvement in his LFTs.  Underwent transesophageal echocardiogram on Friday which unfortunately confirmed the presence of a small mitral valve vegetation.  Had PICC line placed 10/29/2021 for long-term antibiotics. New onset fevers and rigors: Concerning for development of new bacteremia. Severe sepsis: Due to #1 and #2. Acute kidney injury: Creatinine has continued to steadily improve. Elevated LFTs, T. bili: Has continued to steadily improve as well.  RECOMMENDATIONS:    Continue Unasyn for strep and E. coli bacteremia Check blood cultures given new fevers and chills Will empirically start vancomycin due to concern for line infection Lab monitoring Will follow   Principal Problem:   Bacteremia due to Streptococcus Active Problems:   Coronary artery disease   Atrial fibrillation (HCC)   Sepsis (HCC)   Calculus of bile duct with cholangitis and obstruction   Jaundice   Elevated LFTs   Hyperbilirubinemia   Bacteremia due to Escherichia coli   Palliative care by specialist    MEDICATIONS:    Scheduled Meds:  apixaban  5 mg Oral BID   chlorhexidine  15 mL Mouth Rinse BID   Chlorhexidine Gluconate Cloth  6 each Topical Q0600   dapagliflozin propanediol  5 mg Oral Daily   fenofibrate  160 mg Oral Daily   insulin aspart  0-15 Units Subcutaneous TID WC   lactulose  30 g Oral Daily   mouth rinse  15 mL Mouth Rinse q12n4p    metoprolol succinate  50 mg Oral Daily   potassium chloride  20 mEq Oral Daily   rosuvastatin  10 mg Oral Daily   sacubitril-valsartan  1 tablet Oral BID   sodium chloride flush  10-40 mL Intracatheter Q12H   tamsulosin  0.4 mg Oral Daily   Continuous Infusions:  ampicillin-sulbactam (UNASYN) IV     PRN Meds:.docusate sodium, hydrALAZINE, melatonin, ondansetron (ZOFRAN) IV, polyethylene glycol  SUBJECTIVE:   24 hour events:  Patient found to be shaking with chills and rigors this morning Febrile to 102 F Labs this morning reassuring with stable creatinine improved LFTs/T. bili WBC stable, platelets improved   Patient having rigors in the bed.  Appears to be developing fever.  PICC line site nontender, not erythematous.  Review of Systems  All other systems reviewed and are negative.    OBJECTIVE:   Blood pressure (!) 142/59, pulse 84, temperature 98.3 F (36.8 C), temperature source Oral, resp. rate 20, height 5\' 11"  (1.803 m), weight 121.6 kg, SpO2 96 %. Body mass index is 37.39 kg/m.  Physical Exam Constitutional:      Appearance: Normal appearance. He is ill-appearing.     Comments: Patient is having rigors in the bed.  Appears ill.  HENT:     Head: Normocephalic and atraumatic.  Eyes:     Extraocular Movements: Extraocular movements intact.     Conjunctiva/sclera: Conjunctivae normal.  Pulmonary:     Effort: Pulmonary effort is normal. No respiratory distress.  Abdominal:     General: There is no distension.     Palpations: Abdomen is soft.     Tenderness: There is no abdominal tenderness.  Musculoskeletal:        General: Normal range of motion.     Cervical back: Normal range of motion and neck supple.  Skin:    General: Skin is warm and dry.     Comments: Skin is cool to the touch.  PICC line site is nontender and nonerythematous.  Neurological:     General: No focal deficit present.     Mental Status: He is alert and oriented to person, place, and  time.  Psychiatric:        Mood and Affect: Mood normal.        Behavior: Behavior normal.     Lab Results: Lab Results  Component Value Date   WBC 14.4 (H) 10/31/2021   HGB 9.3 (L) 10/31/2021   HCT 28.5 (L) 10/31/2021   MCV 88.5 10/31/2021   PLT 184 10/31/2021    Lab Results  Component Value Date   NA 138 10/31/2021   K 3.6 10/31/2021   CO2 25 10/31/2021   GLUCOSE 161 (H) 10/31/2021   BUN 22 10/31/2021   CREATININE 1.11 10/31/2021   CALCIUM 7.7 (L) 10/31/2021   GFRNONAA >60 10/31/2021   GFRAA 55 (L) 09/29/2020    Lab Results  Component Value Date   ALT 46 (H) 10/31/2021   AST 24 10/31/2021   ALKPHOS 79 10/31/2021   BILITOT 1.3 (H) 10/31/2021       Component Value Date/Time   CRP 14.5 (H) 10/31/2021 0145    No results found for: ESRSEDRATE   I have reviewed the micro and lab results in Epic.  Imaging: DG Chest Port 1 View  Result Date: 10/31/2021 CLINICAL DATA:  Shortness of breath in a 81 year old male. EXAM: PORTABLE CHEST 1 VIEW COMPARISON:  Comparison with October 28, 2021. FINDINGS: EKG leads project over the chest. Study limited by patient condition by report. Median sternotomy changes and changes of coronary revascularization. Heart size is stable. No lobar consolidative process. Minimal LEFT basilar airspace disease with slightly improved aeration in the chest compared to previous imaging. No visible pneumothorax. On limited assessment there is no acute skeletal finding. IMPRESSION: Minimal LEFT basilar airspace disease with slightly improved aeration in the chest compared to previous imaging. No other interval change. Electronically Signed   By: Zetta Bills M.D.   On: 10/31/2021 11:52     Imaging independently reviewed in Epic.    [facility] [phone] pager 10/31/2021, 12:05 PM

## 2021-10-31 NOTE — Progress Notes (Signed)
PROGRESS NOTE                                                                                                                                                                                                             Patient Demographics:    [name] Young, is a 81 y.o. male, DOB - 03/19/1941, VOH:[id number]  Outpatient Primary MD for the patient is Ria Bush, MD    LOS - 8  Admit date - 10/23/2021    Chief Complaint  Patient presents with   Abdominal Pain       Brief Narrative (HPI from H&P)     81 year old gentleman with a history of coronary artery disease status post CABG 2019, paroxysmal atrial fibrillation, diabetes, GERD, CKD who presented with abdominal pain, found to have ascending cholangitis and bacteremia, he was kept in ICU was seen by GI underwent ERCP with gallstone extraction, he was also seen by ID for bacteremia.  He was transferred to my service on 10/28/2021 on day 5 of his hospital admission   Subjective:   Patient in bed, appears comfortable, denies any headache, no fever, no chest pain or pressure, no shortness of breath , no abdominal pain. No new focal weakness.   Assessment  & Plan :   Sepsis and bacteremia with Streptococcus infantarius and E. Coli - caused by ascending cholangitis, had gallstones was seen by GI and he underwent ERCP with 2 gallbladder stones removed with sphincterotomy on 10/23/2021, seen by ID currently on IV antibiotics, TTE was stable unfortunately TEE done on 10/28/2021 does confirm mitral valve endocarditis, case discussed with ID PICC line placed on 10/28/2021 for prolonged IV antibiotics.  Thankfully sepsis pathophysiology has resolved and CRP and procalcitonin are trending down.  He is currently on Unasyn x 6 weeks - ID. Overall frail, longterm prognosis looks poor, patient and wife informed.  2.  AKI on CKD 3B.  Seen by nephrology, AKI due to ATN from sepsis.  Improved  after hydration and hemodynamic stability.  Creatinine peaked at 4.5 now down to 1.5.  3.   Elevated LFTs due to #1 coming down.  4.  Acute drop in systolic function of heart with EF dropping to 45% down from 55% few years ago.  Likely due to sepsis.  Currently on beta-blocker, seen by cardiology.  Avoiding ACE/ARB due to renal  failure.  Compensated from this fluid standpoint. Cards on board.  5.  Paroxysmal atrial fibrillation with [place] vas 2 score of greater than 3.  On combination of Lopressor and full dose Lovenox which we will hold on 10/30/2021 due to PICC line site hematoma, resume Eliquis on 10/31/2021.  6.  Severe of weakness and deconditioning.  PT OT and monitor.  May require SNF.  7.  Hypokalemia.  Replaced.  8.  BPH.  On Flomax continue.  9.  DM type II.  For now sliding scale.  A1c satisfactory at 6.1  CBG (last 3)  Recent Labs    10/30/21 1629 10/30/21 2039 10/31/21 0813  GLUCAP 153* 150* 154*         Condition - Extremely Guarded  Family Communication  :  wife [name] Young [phone] - bedside 10/28/21, phone 10/30/21  Code Status :  Full  Consults  :  PCCM, ID, Cards, Pall. Care  PUD Prophylaxis :    Procedures  :     CT Head - non acute  ERCP with removal of 2 gallbladder stones and sphincterotomy on 10/23/2021.    CT Abd & Pelvis - Chest -  1. No evidence of acute aortic syndrome. 2. No acute findings in the thorax to account for the patient's symptoms. 3. 5 mm calculus at the right ureteropelvic junction. At this time, there is no proximal hydronephrosis to indicate urinary tract obstruction. 4. Cholelithiasis without evidence of acute cholecystitis. 5. Dilatation of the common bile duct. No intrahepatic biliary ductal dilatation to clearly indicate biliary tract obstruction. Additionally, there is no calcified choledocholithiasis. This is of uncertain etiology and significance, and could be age related, however, correlation with liver function tests  is recommended. If there is clinical concern for biliary tract obstruction, further evaluation with abdominal MRI with and without IV gadolinium with MRCP would be recommended. 6. Aortic atherosclerosis, in addition to left main and three-vessel coronary artery disease. Status post median sternotomy for CABG including LIMA to the LAD. 7. There are calcifications of the aortic valve. Echocardiographic correlation for evaluation of potential valvular dysfunction may be warranted if clinically indicated. 8. Additional incidental findings, as above.  TTE -  EF 45% with global hypokinesis.  TEE -   Suspected endocarditis of the mitral valve with increased echogenicty, thickening and hockystick motion as well as a small mobile vegetation. There is mild MR. Findings are new compared to TEE in 2019 which was personally reviewed. Dilated ascending aorta to 38 mm. Thickening of the aortic valve leaflets without mobile vegetation. LVEF 40-45%, global hypokinesis. Pre-op EKG demonstrated the patient was back in sinus rhythm with PVC's, therefore, cardioversion was not pursued.      Disposition Plan  :    Status is: Inpatient  DVT Prophylaxis  :  Lovenox  SCDs Start: 10/23/21 0909 apixaban (ELIQUIS) tablet 5 mg  Lab Results  Component Value Date   PLT 184 10/31/2021    Diet :  Diet Order             DIET SOFT Room service appropriate? Yes; Fluid consistency: Nectar Thick  Diet effective now                    Inpatient Medications  Scheduled Meds:  apixaban  5 mg Oral BID   chlorhexidine  15 mL Mouth Rinse BID   Chlorhexidine Gluconate Cloth  6 each Topical Q0600   fenofibrate  160 mg Oral Daily   insulin aspart  0-15  Units Subcutaneous TID WC   lactulose  30 g Oral Daily   mouth rinse  15 mL Mouth Rinse q12n4p   metoprolol succinate  50 mg Oral Daily   potassium chloride  20 mEq Oral Daily   rosuvastatin  10 mg Oral Daily   sacubitril-valsartan  1 tablet Oral BID    sodium chloride flush  10-40 mL Intracatheter Q12H   tamsulosin  0.4 mg Oral Daily   Continuous Infusions:   ceFAZolin (ANCEF) IV     PRN Meds:.docusate sodium, hydrALAZINE, melatonin, ondansetron (ZOFRAN) IV, polyethylene glycol    Time Spent in minutes  30   Lala Lund M.D on 10/31/2021 at 10:39 AM  To page go to [email or web address]   [facility] -  Office  [phone]  See all Orders from today for further details    Objective:   Vitals:   10/30/21 1929 10/31/21 0000 10/31/21 0400 10/31/21 0809  BP: (!) 135/59 132/62 (!) 144/51 (!) 142/59  Pulse: 78 79 79 84  Resp: 20 20 (!) 21 20  Temp: 99.6 F (37.6 C) 98.9 F (37.2 C) 98.8 F (37.1 C) 98.3 F (36.8 C)  TempSrc: Oral Oral Oral Oral  SpO2: 95% 94% 93% 96%  Weight:      Height:        Wt Readings from Last 3 Encounters:  10/30/21 121.6 kg  06/07/21 118 kg  01/28/21 118.8 kg     Intake/Output Summary (Last 24 hours) at 10/31/2021 1039 Last data filed at 10/31/2021 0600 Gross per 24 hour  Intake 0 ml  Output 800 ml  Net -800 ml     Physical Exam  Awake Alert, No new F.N deficits, Normal affect [place].AT,PERRAL Supple Neck, No JVD,   Symmetrical Chest wall movement, Good air movement bilaterally, CTAB RRR,No Gallops, Rubs or new Murmurs,  +ve B.Sounds, Abd Soft, No tenderness,   Trace edema. Left arm PICC line with a large bruise around the site    Data Review:    CBC Recent Labs  Lab 10/25/21 0029 10/26/21 0112 10/27/21 7846 10/28/21 0317 10/29/21 0209 10/30/21 0127 10/31/21 0145  WBC 9.3   < > 9.6 11.0* 13.7* 15.3* 14.4*  HGB 11.1*   < > 10.8* 11.0* 10.1* 10.0* 9.3*  HCT 33.7*   < > 31.7* 32.0* 31.0* 30.7* 28.5*  PLT 63*   < > 70* 92* 124* 148* 184  MCV 89.2   < > 85.4 85.1 86.4 88.0 88.5  MCH 29.4   < > 29.1 29.3 28.1 28.7 28.9  MCHC 32.9   < > 34.1 34.4 32.6 32.6 32.6  RDW 15.8*   < > 16.1* 16.5* 16.5* 16.9* 17.1*  LYMPHSABS 0.4*  --   --   --  1.6 1.9 1.8  MONOABS 0.4  --    --   --  1.1* 0.9 0.8  EOSABS 0.0  --   --   --  0.2 0.2 0.2  BASOSABS 0.0  --   --   --  0.1 0.0 0.0   < > = values in this interval not displayed.    Electrolytes Recent Labs  Lab 10/24/21 1159 10/24/21 1508 10/24/21 2226 10/25/21 0029 10/26/21 0112 10/26/21 9629 10/27/21 5284 10/28/21 0317 10/29/21 0209 10/30/21 0127 10/31/21 0145  NA  --   --    < > 140 141  --  144 142 141 138 138  K  --   --    < > 4.4 3.6  --  3.1* 3.2* 3.5 3.6 3.6  CL  --   --    < > 102 106  --  109 108 107 105 105  CO2  --   --    < > 21* 19*  --  21* 24 24 23 25   GLUCOSE  --   --    < > 96 114*  --  139* 157* 174* 155* 161*  BUN  --   --    < > 57* 71*  --  61* 49* 35* 25* 22  CREATININE  --   --    < > 4.42* 3.65*  --  1.90* 1.51* 1.14 1.08 1.11  CALCIUM  --   --    < > 8.4* 7.8*  --  8.0* 7.9* 7.8* 7.6* 7.7*  AST  --   --   --  1,104* 357*  --   --   --  32 27 24  ALT  --   --   --  905* 515*  --   --   --  104* 63* 46*  ALKPHOS  --   --   --  150* 141*  --   --   --  97 85 79  BILITOT  --   --   --  3.5* 1.9*  --   --   --  1.8* 2.0* 1.3*  ALBUMIN  --   --    < > 2.6* 2.7*  --  2.7*  --  2.2* 2.0* 1.9*  MG  --   --   --  2.5* 2.6*  --   --   --  1.9 1.7 2.0  CRP  --   --   --   --   --   --   --  25.4* 16.7* 14.2* 14.5*  PROCALCITON  --   --   --   --   --   --   --  13.32 5.90 3.48 2.26  LATICACIDVEN 5.4* 5.1*  --  3.0*  --   --   --   --   --   --   --   AMMONIA  --   --   --   --   --  26  --   --   --   --   --   BNP  --   --   --   --   --   --   --   --  727.2* 394.5*  --    < > = values in this interval not displayed.    ------------------------------------------------------------------------------------------------------------------ No results for input(s): CHOL, HDL, LDLCALC, TRIG, CHOLHDL, LDLDIRECT in the last 72 hours.  Lab Results  Component Value Date   HGBA1C 6.1 (H) 10/23/2021    No results for input(s): TSH, T4TOTAL, T3FREE, THYROIDAB in the last 72 hours.  Invalid  input(s): FREET3 ------------------------------------------------------------------------------------------------------------------ ID Labs Recent Labs  Lab 10/24/21 1159 10/24/21 1508 10/25/21 0029 10/26/21 0112 10/27/21 5956 10/28/21 0317 10/29/21 0209 10/30/21 0127 10/31/21 0145  WBC  --   --  9.3   < > 9.6 11.0* 13.7* 15.3* 14.4*  PLT  --   --  63*   < > 70* 92* 124* 148* 184  CRP  --   --   --   --   --  25.4* 16.7* 14.2* 14.5*  PROCALCITON  --   --   --   --   --  13.32 5.90 3.48 2.26  LATICACIDVEN 5.4* 5.1* 3.0*  --   --   --   --   --   --   CREATININE  --   --  4.42*   < > 1.90* 1.51* 1.14 1.08 1.11   < > = values in this interval not displayed.   Cardiac Enzymes No results for input(s): CKMB, TROPONINI, MYOGLOBIN in the last 168 hours.  Invalid input(s): CK   Radiology Reports DG Chest Port 1 View  Result Date: 10/28/2021 CLINICAL DATA:  Shortness of breath EXAM: PORTABLE CHEST 1 VIEW COMPARISON:  Radiograph 10/23/2021 FINDINGS: Unchanged enlarged cardiac silhouette. Prior median sternotomy and CABG. There are left basilar opacities. Small bilateral pleural effusions, left greater than right. No visible pneumothorax. No acute osseous abnormality. IMPRESSION: Small bilateral pleural effusions, left greater than right. Left basilar opacities could be atelectasis or infection. Unchanged cardiomegaly. Electronically Signed   By: Maurine Simmering M.D.   On: 10/28/2021 11:58   ECHO TEE  Result Date: 10/28/2021    TRANSESOPHOGEAL ECHO REPORT   Patient Name:   [name] Young. Date of Exam: 10/28/2021 Medical Rec #:  [id number]            Height:       71.0 in Accession #:    [id number]           Weight:       264.6 lb Date of Birth:  07/07/1941           BSA:          2.375 m Patient Age:    57 years             BP:           169/70 mmHg Patient Gender: M                    HR:           77 bpm. Exam Location:  Inpatient Procedure: Transesophageal Echo, Cardiac Doppler and Color  Doppler Indications:     Bacteremia  History:         Patient has prior history of Echocardiogram examinations, most                  recent 10/24/2021. CAD, Abnormal ECG and Prior CABG,                  Arrythmias:Atrial Fibrillation, Signs/Symptoms:Bacteremia; Risk                  Factors:Diabetes.  Sonographer:     Roseanna Rainbow RDCS Referring Phys:  [id number] [facility] B ROBERTS Diagnosing Phys: Lyman Bishop MD PROCEDURE: After discussion of the risks and benefits of a TEE, an informed consent was obtained from the patient. The transesophogeal probe was passed without difficulty through the esophogus of the patient. Imaged were obtained with the patient in a supine position. Sedation performed by different physician. The patient was monitored while under deep sedation. Anesthestetic sedation was provided intravenously by Anesthesiology: 168mg  of Propofol. The patient developed no complications during the procedure. IMPRESSIONS  1. Left ventricular ejection fraction, by estimation, is 40 to 45%. The left ventricle has mildly decreased function. The left ventricle demonstrates global hypokinesis.  2. Right ventricular systolic function is normal. The right ventricular size is normal.  3. No left atrial/left atrial appendage thrombus was detected.  4. Diffuse thickening and increased echogenicity of the anterior mitral leaflet with small mobile vegetation, consistent with endocarditis. The mitral  valve is abnormal. Mild mitral valve regurgitation.  5. Small vegetation on the mitral valve.  6. No vegetation, Lambl's noted on the leaflet tips. The aortic valve is tricuspid. There is moderate thickening of the aortic valve. Aortic valve regurgitation is not visualized.  7. Aortic dilatation noted. There is borderline dilatation of the ascending aorta, measuring 38 mm. Conclusion(s)/Recommendation(s): Findings are concerning for vegetation/infective endocarditis as detailed above. FINDINGS  Left Ventricle: Left ventricular  ejection fraction, by estimation, is 40 to 45%. The left ventricle has mildly decreased function. The left ventricle demonstrates global hypokinesis. The left ventricular internal cavity size was normal in size. Right Ventricle: The right ventricular size is normal. No increase in right ventricular wall thickness. Right ventricular systolic function is normal. Left Atrium: Left atrial size was normal in size. No left atrial/left atrial appendage thrombus was detected. Right Atrium: Right atrial size was normal in size. Pericardium: There is no evidence of pericardial effusion. Mitral Valve: Diffuse thickening and increased echogenicity of the anterior mitral leaflet with small mobile vegetation, consistent with endocarditis. The mitral valve is abnormal. A small vegetation is seen on the anterior mitral leaflet. The MV vegetation measures 5 mm x 5 mm. Mild mitral valve regurgitation. Tricuspid Valve: The tricuspid valve is grossly normal. Tricuspid valve regurgitation is trivial. Aortic Valve: No vegetation, Lambl's noted on the leaflet tips. The aortic valve is tricuspid. There is moderate thickening of the aortic valve. Aortic valve regurgitation is not visualized. Pulmonic Valve: The pulmonic valve was normal in structure. Pulmonic valve regurgitation is not visualized. Aorta: Aortic dilatation noted. There is borderline dilatation of the ascending aorta, measuring 38 mm. IAS/Shunts: No atrial level shunt detected by color flow Doppler. Lyman Bishop MD Electronically signed by Lyman Bishop MD Signature Date/Time: 10/28/2021/2:25:50 PM    Final    [place] EKG SITE RITE  Result Date: 10/28/2021 If Site Rite image not attached, placement could not be confirmed due to current cardiac rhythm.

## 2021-10-31 NOTE — Progress Notes (Signed)
Physical Therapy Treatment Patient Details Name: [name] Young. MRN: [id number] DOB: [birth year] Today's Date: 10/31/2021   History of Present Illness 81 y.o. male presented to the ED 10/23/21 with complaints of abdominal pain with associated nausea and vomiting that began the day before. In ED pt found to be in A-fib RVR, hypertensive, and tachypnea. CTA chest/abd/pelvis reveled dilated bile duct, acute cholelithiasis without cholecystis found to have cholangitis status post ERCP with gastroenterology on 10/23/2021.  His blood cultures on admission are positive for E. coli and Streptococcus species.  PMH significant for CAD s/p CAGB 2019 A-fib,, HTN. HLD, diabetes, GERD, and CKD    PT Comments    Patient lethargic and with difficulty staying awake throughout session. Noted pt had episode of rigors and fever this a.m. Patient was able to sit at EOB, complete LE exercises, and work on [place] transfer along EOB. Discharge plan remains appropriate as pt not able to tolerate 3+ hours of therapy.     Recommendations for follow up therapy are one component of a multi-disciplinary discharge planning process, led by the attending physician.  Recommendations may be updated based on patient status, additional functional criteria and insurance authorization.  Follow Up Recommendations  Skilled nursing-short term rehab (<3 hours/day)     Assistance Recommended at Discharge Frequent or constant Supervision/Assistance  Patient can return home with the following Two people to help with walking and/or transfers;Help with stairs or ramp for entrance;Assist for transportation   Equipment Recommendations  Wheelchair (measurements PT)    Recommendations for Other Services       Precautions / Restrictions Precautions Precautions: Fall Restrictions Weight Bearing Restrictions: No     Mobility  Bed Mobility Overal bed mobility: Needs Assistance Bed Mobility: Supine to Sit, Sit to Sidelying      Supine to sit: Mod assist   Sit to sidelying: Mod assist General bed mobility comments: HOB elevated, assisted legs over EOB and pt then assisted with rail to raise torso; assisted with scooting hips out to EOB with use of bed pad; return to bed with guiding assist for trunk and assist to lift legs onto bed    Transfers                  Lateral/Scoot Transfers: Mod assist General transfer comment: pt too lethargic to attempt standing with +1 assist; lateral scoot along EOB toward HOB with bed pad under pelvis x 4 scoots    Ambulation/Gait                   Stairs             Wheelchair Mobility    Modified Rankin (Stroke Patients Only)       Balance Overall balance assessment: Needs assistance Sitting-balance support: No upper extremity supported, Feet supported Sitting balance-Leahy Scale: Fair                                      Cognition Arousal/Alertness: Lethargic Behavior During Therapy: WFL for tasks assessed/performed Overall Cognitive Status: Impaired/Different from baseline Area of Impairment: Problem solving, Following commands, Awareness, Safety/judgement                       Following Commands: Follows one step commands with increased time, Follows one step commands inconsistently Safety/Judgement: Decreased awareness of deficits Awareness: Emergent Problem Solving: Slow processing, Decreased initiation, Requires verbal  cues, Difficulty sequencing, Requires tactile cues General Comments: Very sleepy, but arousable. Required repeated cues (multi-modal) to complete tasks/exercises        Exercises General Exercises - Lower Extremity Ankle Circles/Pumps: AROM, 5 reps Long Arc Quad: AROM, Both, 5 reps Heel Slides: AAROM, Strengthening, Both, 5 reps (resisted extension) Hip Flexion/Marching: AROM, Both, 5 reps, Seated Low Level/ICU Exercises Stabilized Bridging: Strengthening, Both, 5 reps    General  Comments        Pertinent Vitals/Pain Pain Assessment Pain Assessment: No/denies pain    Home Living Family/patient expects to be discharged to:: Private residence Living Arrangements: Spouse/significant other Available Help at Discharge: Family;Available 24 hours/day Type of Home: House Home Access: Stairs to enter Entrance Stairs-Rails: Left Entrance Stairs-Number of Steps: 3   Home Layout: One level Home Equipment: Conservation officer, nature (2 wheels);Tub bench;Hand held shower head;Cane - single point Additional Comments: Noted tub bench listed from chart review though pt denied having shower chair at home with OT eval    Prior Function            PT Goals (current goals can now be found in the care plan section) Acute Rehab PT Goals Time For Goal Achievement: 11/09/21 Potential to Achieve Goals: Good Progress towards PT goals: Not progressing toward goals - comment (more lethargic)    Frequency    Min 3X/week      PT Plan Current plan remains appropriate    Co-evaluation              AM-PAC PT "6 Clicks" Mobility   Outcome Measure  Help needed turning from your back to your side while in a flat bed without using bedrails?: Total Help needed moving from lying on your back to sitting on the side of a flat bed without using bedrails?: Total Help needed moving to and from a bed to a chair (including a wheelchair)?: Total Help needed standing up from a chair using your arms (e.g., wheelchair or bedside chair)?: Total Help needed to walk in hospital room?: Total Help needed climbing 3-5 steps with a railing? : Total 6 Click Score: 6    End of Session   Activity Tolerance: Patient limited by lethargy Patient left: in bed;with call bell/phone within reach;with bed alarm set;with family/visitor present;with nursing/sitter in room Nurse Communication: Mobility status;Need for lift equipment PT Visit Diagnosis: Muscle weakness (generalized) (M62.81);Difficulty in  walking, not elsewhere classified (R26.2)     Time: 2426-8341 PT Time Calculation (min) (ACUTE ONLY): 27 min  Charges:  $Therapeutic Exercise: 8-22 mins $Therapeutic Activity: 8-22 mins                      Arby Barrette, PT Acute Rehabilitation Services  Pager [phone] Office [phone]    Rexanne Mano 10/31/2021, 4:12 PM

## 2021-10-31 NOTE — Progress Notes (Addendum)
Pharmacy Antibiotic Note  Javanni Maring. is a 81 y.o. male admitted on 10/23/2021 and found to have E. Coli + Strep intermedius bacteremia with MV IE. Today, the patient was noted to have fevers/rigors and pharmacy consulted to add Vancomycin for broadened coverage while awaiting repeat blood cultures.   Plan: - Vancomycin 2000 mg IV x 1 followed by 1500 mg IV every 24 hours (eAUC 475, SCr 1.11, Vd 0.5) - Continue Unasyn 3g IV every 6 hours - Will continue to follow renal function, culture results, LOT, and antibiotic de-escalation plans   Height: 5\' 11"  (180.3 cm) Weight: 121.6 kg (268 lb 1.3 oz) IBW/kg (Calculated) : 75.3  Temp (24hrs), Avg:99.5 F (37.5 C), Min:98.3 F (36.8 C), Max:102.1 F (38.9 C)  Recent Labs  Lab 10/24/21 1508 10/25/21 0029 10/26/21 0112 10/27/21 0852 10/28/21 0317 10/29/21 0209 10/30/21 0127 10/31/21 0145  WBC  --  9.3   < > 9.6 11.0* 13.7* 15.3* 14.4*  CREATININE  --  4.42*   < > 1.90* 1.51* 1.14 1.08 1.11  LATICACIDVEN 5.1* 3.0*  --   --   --   --   --   --    < > = values in this interval not displayed.    Estimated Creatinine Clearance: 71.6 mL/min (by C-G formula based on SCr of 1.11 mg/dL).    No Known Allergies  Antimicrobials this admission: Zosyn 2/12 x 1 CTX 2/12 >> 2/14 Flagyl 2/12 >> 2/14 Unasyn 2/14 >> Vancomycin 2/20 >>  Dose adjustments this admission: N/a  Microbiology results: 2/12 COVID/flu >> neg 2/12 MRSA PCR >> meg 2/12 BCx >> E.coli (pan-sensitive) + Strep infantarius (S-PCN, CTX, Vanc)  Thank you for allowing pharmacy to be a part of this patients care.  Alycia Rossetti, PharmD, BCPS Clinical Pharmacist 10/31/2021 2:34 PM   **Pharmacist phone directory can now be found on [email or web address] ([email or web address]).  Listed under [place].

## 2021-10-31 NOTE — TOC Progression Note (Signed)
Transition of Care (TOC) - Progression Note  ° ° °Patient Details  °Name: [name] V Skora Jr. °MRN: [id number] °Date of Birth: 04/18/1941 ° °Transition of Care (TOC) CM/SW Contact  ° P , LCSW °Phone Number: °10/31/2021, 1:38 PM ° °Clinical Narrative:    ° °CSW initially met with pt, pt daughter, and pt spouse bedside. MD in room as well. Pt was shivering and MD was assessing; CSW was asked to come back at later time when more appropriate.  ° °1330: CSW met with pt and pt spouse bedside. Spouse explained they are still undecided between SNF vs Home with HH. CSW provided SNF bed offers; Spouse states that [facility] would be their first choice  out of the offers but reiterated that they are still undecided about SNF vs home and are hopeful that pt will be more mobile. Spouse begins to mention wanting pt to go to CIR; CSW explained that current PT recommendation is SNF and that pt may not meet criteria for CIR if PT isn't recommending it. TOC will continue to follow for disposition.  ° °Expected Discharge Plan:  (TBD) °Barriers to Discharge: Continued Medical Work up ° °Expected Discharge Plan and Services °Expected Discharge Plan:  (TBD) °In-house Referral: Clinical Social Work °  °Post Acute Care Choice:  (TBD) °Living arrangements for the past 2 months: Single Family Home °                °  °  °  °  °  °  °  °  °  °  ° ° °Social Determinants of Health (SDOH) Interventions °  ° °Readmission Risk Interventions °No flowsheet data found. ° °

## 2021-10-31 NOTE — Plan of Care (Signed)

## 2021-11-01 DIAGNOSIS — I5021 Acute systolic (congestive) heart failure: Secondary | ICD-10-CM | POA: Diagnosis not present

## 2021-11-01 DIAGNOSIS — K8033 Calculus of bile duct with acute cholangitis with obstruction: Secondary | ICD-10-CM | POA: Diagnosis not present

## 2021-11-01 DIAGNOSIS — I4891 Unspecified atrial fibrillation: Secondary | ICD-10-CM | POA: Diagnosis not present

## 2021-11-01 DIAGNOSIS — I059 Rheumatic mitral valve disease, unspecified: Secondary | ICD-10-CM

## 2021-11-01 DIAGNOSIS — R7989 Other specified abnormal findings of blood chemistry: Secondary | ICD-10-CM | POA: Diagnosis not present

## 2021-11-01 DIAGNOSIS — A419 Sepsis, unspecified organism: Secondary | ICD-10-CM | POA: Diagnosis not present

## 2021-11-01 DIAGNOSIS — R7881 Bacteremia: Secondary | ICD-10-CM | POA: Diagnosis not present

## 2021-11-01 DIAGNOSIS — I472 Ventricular tachycardia, unspecified: Secondary | ICD-10-CM | POA: Diagnosis not present

## 2021-11-01 DIAGNOSIS — A491 Streptococcal infection, unspecified site: Secondary | ICD-10-CM

## 2021-11-01 LAB — CBC WITH DIFFERENTIAL/PLATELET
Abs Immature Granulocytes: 0.32 10*3/uL — ABNORMAL HIGH (ref 0.00–0.07)
Basophils Absolute: 0 10*3/uL (ref 0.0–0.1)
Basophils Relative: 0 %
Eosinophils Absolute: 0.2 10*3/uL (ref 0.0–0.5)
Eosinophils Relative: 2 %
HCT: 28.6 % — ABNORMAL LOW (ref 39.0–52.0)
Hemoglobin: 8.9 g/dL — ABNORMAL LOW (ref 13.0–17.0)
Immature Granulocytes: 2 %
Lymphocytes Relative: 10 %
Lymphs Abs: 1.5 10*3/uL (ref 0.7–4.0)
MCH: 28.3 pg (ref 26.0–34.0)
MCHC: 31.1 g/dL (ref 30.0–36.0)
MCV: 90.8 fL (ref 80.0–100.0)
Monocytes Absolute: 0.7 10*3/uL (ref 0.1–1.0)
Monocytes Relative: 5 %
Neutro Abs: 12.7 10*3/uL — ABNORMAL HIGH (ref 1.7–7.7)
Neutrophils Relative %: 81 %
Platelets: 218 10*3/uL (ref 150–400)
RBC: 3.15 MIL/uL — ABNORMAL LOW (ref 4.22–5.81)
RDW: 17.2 % — ABNORMAL HIGH (ref 11.5–15.5)
WBC: 15.5 10*3/uL — ABNORMAL HIGH (ref 4.0–10.5)
nRBC: 0 % (ref 0.0–0.2)

## 2021-11-01 LAB — URINE CULTURE: Culture: NO GROWTH

## 2021-11-01 LAB — GLUCOSE, CAPILLARY
Glucose-Capillary: 146 mg/dL — ABNORMAL HIGH (ref 70–99)
Glucose-Capillary: 166 mg/dL — ABNORMAL HIGH (ref 70–99)
Glucose-Capillary: 132 mg/dL — ABNORMAL HIGH (ref 70–99)
Glucose-Capillary: 118 mg/dL — ABNORMAL HIGH (ref 70–99)

## 2021-11-01 LAB — MAGNESIUM: Magnesium: 2 mg/dL (ref 1.7–2.4)

## 2021-11-01 LAB — COMPREHENSIVE METABOLIC PANEL
ALT: 51 U/L — ABNORMAL HIGH (ref 0–44)
AST: 64 U/L — ABNORMAL HIGH (ref 15–41)
Albumin: 1.9 g/dL — ABNORMAL LOW (ref 3.5–5.0)
Alkaline Phosphatase: 77 U/L (ref 38–126)
Anion gap: 7 (ref 5–15)
BUN: 20 mg/dL (ref 8–23)
CO2: 25 mmol/L (ref 22–32)
Calcium: 7.7 mg/dL — ABNORMAL LOW (ref 8.9–10.3)
Chloride: 106 mmol/L (ref 98–111)
Creatinine, Ser: 1.18 mg/dL (ref 0.61–1.24)
GFR, Estimated: >60 mL/min (ref >60)
Glucose, Bld: 132 mg/dL — ABNORMAL HIGH (ref 70–99)
Potassium: 3.6 mmol/L (ref 3.5–5.1)
Sodium: 138 mmol/L (ref 135–145)
Total Bilirubin: 1.2 mg/dL (ref 0.3–1.2)
Total Protein: 5 g/dL — ABNORMAL LOW (ref 6.5–8.1)

## 2021-11-01 LAB — C-REACTIVE PROTEIN: CRP: 15.7 mg/dL — ABNORMAL HIGH (ref <1.0)

## 2021-11-01 LAB — PROCALCITONIN: Procalcitonin: 3.17 ng/mL

## 2021-11-01 NOTE — Progress Notes (Signed)
[facility]  Date of Admission:  10/23/2021      Total days of antibiotics 9   Vancomycin 2/20 >> current  Unasyn 2/14 >> current           ASSESSMENT: [name] Young. is a 81 y.o. male with streptococcus infantarius and e coli bacteremia complicated by mitral valve endocarditis. Currently on day 9 of treatment. Yesterday with 1 episode of rigors / high fever. Started empirically on vancomycin after blood cultures repeated for concern over possible line related infection. Thus far, nothing growing preliminarily. Will follow another 24 hours with vancomycin and consider stopping if no new infection identified.  No abdominal pain or headaches to suggest vegetation embolization.   Left FA bruising and mild swelling - feels soft and not painful to palpation. Low suspicion for clot. Will monitor.    PLAN: Continue current antibiotics Follow blood cultures    Principal Problem:   Bacteremia due to Streptococcus Active Problems:   Coronary artery disease   Atrial fibrillation (HCC)   Sepsis (HCC)   Calculus of bile duct with cholangitis and obstruction   Jaundice   Elevated LFTs   Hyperbilirubinemia   Bacteremia due to Escherichia coli   Palliative care by specialist    apixaban  5 mg Oral BID   chlorhexidine  15 mL Mouth Rinse BID   Chlorhexidine Gluconate Cloth  6 each Topical Q0600   dapagliflozin propanediol  5 mg Oral Daily   fenofibrate  160 mg Oral Daily   insulin aspart  0-15 Units Subcutaneous TID WC   lactulose  30 g Oral Daily   mouth rinse  15 mL Mouth Rinse q12n4p   metoprolol succinate  50 mg Oral Daily   potassium chloride  20 mEq Oral Daily   rosuvastatin  10 mg Oral Daily   sacubitril-valsartan  1 tablet Oral BID   sodium chloride flush  10-40 mL Intracatheter Q12H   tamsulosin  0.4 mg Oral Daily    SUBJECTIVE: Better today. Wife joins him in the room.  No fevers/shaking chills. Some swelling / bruising to  the left forearm noted but no pain/discomfort here.   Review of Systems: Review of Systems  Constitutional:  Negative for chills, fever and malaise/fatigue.  Respiratory: Negative.    Cardiovascular: Negative.   Gastrointestinal: Negative.   Genitourinary: Negative.   Skin:  Negative for rash.   No Known Allergies  OBJECTIVE: Vitals:   11/01/21 0423 11/01/21 0729 11/01/21 0800 11/01/21 1214  BP: (!) 122/41 (!) 141/50  139/67  Pulse: 85 78  79  Resp: (!) 24 (!) 23  (!) 25  Temp: 98.1 F (36.7 C)  99.1 F (37.3 C) 98.3 F (36.8 C)  TempSrc: Axillary  Axillary Oral  SpO2: 94% 94%  96%  Weight: 121.3 kg     Height:       Body mass index is 37.3 kg/m.   Physical Exam Constitutional:      Appearance: He is well-developed.     Comments: Sitting in recliner. Well appearing today  Cardiovascular:     Rate and Rhythm: Normal rate and regular rhythm.  Abdominal:     Tenderness: There is no abdominal tenderness.  Skin:    General: Skin is warm and dry.     Comments: L arm with some bruising noted to anterior/lateral forearm. Soft   Neurological:     Mental Status: He is alert and oriented to person,  place, and time.    Lab Results Lab Results  Component Value Date   WBC 15.5 (H) 11/01/2021   HGB 8.9 (L) 11/01/2021   HCT 28.6 (L) 11/01/2021   MCV 90.8 11/01/2021   PLT 218 11/01/2021    Lab Results  Component Value Date   CREATININE 1.18 11/01/2021   BUN 20 11/01/2021   NA 138 11/01/2021   K 3.6 11/01/2021   CL 106 11/01/2021   CO2 25 11/01/2021    Lab Results  Component Value Date   ALT 51 (H) 11/01/2021   AST 64 (H) 11/01/2021   ALKPHOS 77 11/01/2021   BILITOT 1.2 11/01/2021     Microbiology: Recent Results (from the past 240 hour(s))  Resp Panel by RT-PCR (Flu A&B, Covid) Nasopharyngeal Swab     Status: None   Collection Time: 10/23/21  3:36 AM   Specimen: Nasopharyngeal Swab; Nasopharyngeal(NP) swabs in vial transport medium  Result Value Ref Range  Status   SARS Coronavirus 2 by RT PCR NEGATIVE NEGATIVE Final    Comment: (NOTE) SARS-CoV-2 target nucleic acids are NOT DETECTED.  The SARS-CoV-2 RNA is generally detectable in upper respiratory specimens during the acute phase of infection. The lowest concentration of SARS-CoV-2 viral copies this assay can detect is 138 copies/mL. A negative result does not preclude SARS-Cov-2 infection and should not be used as the sole basis for treatment or other patient management decisions. A negative result may occur with  improper specimen collection/handling, submission of specimen other than nasopharyngeal swab, presence of viral mutation(s) within the areas targeted by this assay, and inadequate number of viral copies(<138 copies/mL). A negative result must be combined with clinical observations, patient history, and epidemiological information. The expected result is Negative.  Fact Sheet for Patients:  EntrepreneurPulse.com.au  Fact Sheet for Healthcare Providers:  IncredibleEmployment.be  This test is no t yet approved or cleared by the [place] FDA and  has been authorized for detection and/or diagnosis of SARS-CoV-2 by FDA under an Emergency Use Authorization (EUA). This EUA will remain  in effect (meaning this test can be used) for the duration of the COVID-19 declaration under Section 564(b)(1) of the Act, 21 U.S.C.section 360bbb-3(b)(1), unless the authorization is terminated  or revoked sooner.       Influenza A by PCR NEGATIVE NEGATIVE Final   Influenza B by PCR NEGATIVE NEGATIVE Final    Comment: (NOTE) The Xpert Xpress SARS-CoV-2/FLU/RSV plus assay is intended as an aid in the diagnosis of influenza from Nasopharyngeal swab specimens and should not be used as a sole basis for treatment. Nasal washings and aspirates are unacceptable for Xpert Xpress SARS-CoV-2/FLU/RSV testing.  Fact Sheet for  Patients: EntrepreneurPulse.com.au  Fact Sheet for Healthcare Providers: IncredibleEmployment.be  This test is not yet approved or cleared by the [place] FDA and has been authorized for detection and/or diagnosis of SARS-CoV-2 by FDA under an Emergency Use Authorization (EUA). This EUA will remain in effect (meaning this test can be used) for the duration of the COVID-19 declaration under Section 564(b)(1) of the Act, 21 U.S.C. section 360bbb-3(b)(1), unless the authorization is terminated or revoked.  Performed at [facility], [place] [name], [place], [place] [address]   Blood culture (routine x 2)     Status: Abnormal   Collection Time: 10/23/21  4:15 AM   Specimen: BLOOD  Result Value Ref Range Status   Specimen Description BLOOD SITE NOT SPECIFIED  Final   Special Requests   Final  BOTTLES DRAWN AEROBIC AND ANAEROBIC Blood Culture adequate volume   Culture  Setup Time   Final    GRAM POSITIVE COCCI IN PAIRS AND CHAINS GRAM NEGATIVE RODS IN BOTH AEROBIC AND ANAEROBIC BOTTLES CRITICAL RESULT CALLED TO, READ BACK BY AND VERIFIED WITH: Vena Austria M.WEISKOPF ON [id number] AT [id number] BY E.PARRISH Performed at [facility], [place] [address] [place], [place] [address]    Culture ESCHERICHIA COLI STREPTOCOCCUS INFANTARIUS  (A)  Final   Report Status 10/25/2021 FINAL  Final   Organism ID, Bacteria ESCHERICHIA COLI  Final   Organism ID, Bacteria STREPTOCOCCUS INFANTARIUS  Final      Susceptibility   Escherichia coli - MIC*    AMPICILLIN 8 SENSITIVE Sensitive     CEFAZOLIN <=4 SENSITIVE Sensitive     CEFEPIME <=0.12 SENSITIVE Sensitive     CEFTAZIDIME <=1 SENSITIVE Sensitive     CEFTRIAXONE <=0.25 SENSITIVE Sensitive     CIPROFLOXACIN <=0.25 SENSITIVE Sensitive     GENTAMICIN <=1 SENSITIVE Sensitive     IMIPENEM <=0.25 SENSITIVE Sensitive     TRIMETH/SULFA <=20 SENSITIVE Sensitive     AMPICILLIN/SULBACTAM 4 SENSITIVE Sensitive      PIP/TAZO <=4 SENSITIVE Sensitive     * ESCHERICHIA COLI   Streptococcus infantarius - MIC*    PENICILLIN <=0.06 SENSITIVE Sensitive     CEFTRIAXONE <=0.12 SENSITIVE Sensitive     ERYTHROMYCIN >=8 RESISTANT Resistant     LEVOFLOXACIN 4 INTERMEDIATE Intermediate     VANCOMYCIN 0.5 SENSITIVE Sensitive     * STREPTOCOCCUS INFANTARIUS  Blood Culture ID Panel (Reflexed)     Status: Abnormal   Collection Time: 10/23/21  4:15 AM  Result Value Ref Range Status   Enterococcus faecalis NOT DETECTED NOT DETECTED Final   Enterococcus Faecium NOT DETECTED NOT DETECTED Final   Listeria monocytogenes NOT DETECTED NOT DETECTED Final   Staphylococcus species NOT DETECTED NOT DETECTED Final   Staphylococcus aureus (BCID) NOT DETECTED NOT DETECTED Final   Staphylococcus epidermidis NOT DETECTED NOT DETECTED Final   Staphylococcus lugdunensis NOT DETECTED NOT DETECTED Final   Streptococcus species DETECTED (A) NOT DETECTED Final    Comment: Not Enterococcus species, Streptococcus agalactiae, Streptococcus pyogenes, or Streptococcus pneumoniae. CRITICAL RESULT CALLED TO, READ BACK BY AND VERIFIED WITH: PHARM D M.WEISKOPF ON [id number] AT 9449 BY E.PARRISH    Streptococcus agalactiae NOT DETECTED NOT DETECTED Final   Streptococcus pneumoniae NOT DETECTED NOT DETECTED Final   Streptococcus pyogenes NOT DETECTED NOT DETECTED Final   A.calcoaceticus-baumannii NOT DETECTED NOT DETECTED Final   Bacteroides fragilis NOT DETECTED NOT DETECTED Final   Enterobacterales DETECTED (A) NOT DETECTED Final    Comment: Enterobacterales represent a large order of gram negative bacteria, not a single organism. CRITICAL RESULT CALLED TO, READ BACK BY AND VERIFIED WITH: PHARM D M.WEISKOPF ON [id number] AT [id number] BY E.PARRISH    Enterobacter cloacae complex NOT DETECTED NOT DETECTED Final   Escherichia coli DETECTED (A) NOT DETECTED Final    Comment: CRITICAL RESULT CALLED TO, READ BACK BY AND VERIFIED WITH: PHARM D M.WEISKOPF  ON [id number] AT 1344 BY E.PARRISH    Klebsiella aerogenes NOT DETECTED NOT DETECTED Final   Klebsiella oxytoca NOT DETECTED NOT DETECTED Final   Klebsiella pneumoniae NOT DETECTED NOT DETECTED Final   Proteus species NOT DETECTED NOT DETECTED Final   Salmonella species NOT DETECTED NOT DETECTED Final   Serratia marcescens NOT DETECTED NOT DETECTED Final   Haemophilus influenzae NOT DETECTED NOT DETECTED Final   Neisseria meningitidis NOT  DETECTED NOT DETECTED Final   Pseudomonas aeruginosa NOT DETECTED NOT DETECTED Final   Stenotrophomonas maltophilia NOT DETECTED NOT DETECTED Final   Candida albicans NOT DETECTED NOT DETECTED Final   Candida auris NOT DETECTED NOT DETECTED Final   Candida glabrata NOT DETECTED NOT DETECTED Final   Candida krusei NOT DETECTED NOT DETECTED Final   Candida parapsilosis NOT DETECTED NOT DETECTED Final   Candida tropicalis NOT DETECTED NOT DETECTED Final   Cryptococcus neoformans/gattii NOT DETECTED NOT DETECTED Final   CTX-M ESBL NOT DETECTED NOT DETECTED Final   Carbapenem resistance IMP NOT DETECTED NOT DETECTED Final   Carbapenem resistance KPC NOT DETECTED NOT DETECTED Final   Carbapenem resistance NDM NOT DETECTED NOT DETECTED Final   Carbapenem resist OXA 48 LIKE NOT DETECTED NOT DETECTED Final   Carbapenem resistance VIM NOT DETECTED NOT DETECTED Final    Comment: Performed at [facility], [address], [place], [place] [address]  Blood culture (routine x 2)     Status: Abnormal   Collection Time: 10/23/21  4:20 AM   Specimen: BLOOD  Result Value Ref Range Status   Specimen Description BLOOD SITE NOT SPECIFIED  Final   Special Requests   Final    BOTTLES DRAWN AEROBIC AND ANAEROBIC Blood Culture adequate volume   Culture  Setup Time   Final    GRAM POSITIVE COCCI IN PAIRS AND CHAINS GRAM NEGATIVE RODS IN BOTH AEROBIC AND ANAEROBIC BOTTLES CRITICAL VALUE NOTED.  VALUE IS CONSISTENT WITH PREVIOUSLY REPORTED AND CALLED VALUE.     Culture (A)  Final    ESCHERICHIA COLI STREPTOCOCCUS INFANTARIUS SUSCEPTIBILITIES PERFORMED ON PREVIOUS CULTURE WITHIN THE LAST 5 DAYS. Performed at [facility], [place] [address], [place], [place] [address]    Report Status 10/25/2021 FINAL  Final  MRSA Next Gen by PCR, Nasal     Status: None   Collection Time: 10/23/21  2:27 PM   Specimen: Nasal Mucosa; Nasal Swab  Result Value Ref Range Status   MRSA by PCR Next Gen NOT DETECTED NOT DETECTED Final    Comment: (NOTE) The GeneXpert MRSA Assay (FDA approved for NASAL specimens only), is one component of a comprehensive MRSA colonization surveillance program. It is not intended to diagnose MRSA infection nor to guide or monitor treatment for MRSA infections. Test performance is not FDA approved in patients less than 20 years old. Performed at [facility], [place] [address], [place], [place] [address]   Culture, blood (routine x 2)     Status: None   Collection Time: 10/24/21 12:00 PM   Specimen: BLOOD  Result Value Ref Range Status   Specimen Description BLOOD RIGHT ANTECUBITAL  Final   Special Requests   Final    BOTTLES DRAWN AEROBIC AND ANAEROBIC Blood Culture results may not be optimal due to an inadequate volume of blood received in culture bottles   Culture   Final    NO GROWTH 5 DAYS Performed at [facility], [place] [address] [place], [place] [address]    Report Status 10/29/2021 FINAL  Final  Culture, blood (routine x 2)     Status: None   Collection Time: 10/24/21 12:03 PM   Specimen: BLOOD  Result Value Ref Range Status   Specimen Description BLOOD RIGHT ANTECUBITAL  Final   Special Requests   Final    BOTTLES DRAWN AEROBIC AND ANAEROBIC Blood Culture adequate volume   Culture   Final    NO GROWTH 5  DAYS Performed at [facility], [place] [address], [place], [place] [address]    Report Status 10/29/2021 FINAL  Final  Urine Culture     Status: None   Collection Time: 10/31/21 11:23 AM    Specimen: Urine, Clean Catch  Result Value Ref Range Status   Specimen Description URINE, CLEAN CATCH  Final   Special Requests NONE  Final   Culture   Final    NO GROWTH Performed at [facility], [place] [address], [place], [place] [address]    Report Status 11/01/2021 FINAL  Final  Resp Panel by RT-PCR (Flu A&B, Covid) Nasopharyngeal Swab     Status: None   Collection Time: 10/31/21 12:53 PM   Specimen: Nasopharyngeal Swab; Nasopharyngeal(NP) swabs in vial transport medium  Result Value Ref Range Status   SARS Coronavirus 2 by RT PCR NEGATIVE NEGATIVE Final    Comment: (NOTE) SARS-CoV-2 target nucleic acids are NOT DETECTED.  The SARS-CoV-2 RNA is generally detectable in upper respiratory specimens during the acute phase of infection. The lowest concentration of SARS-CoV-2 viral copies this assay can detect is 138 copies/mL. A negative result does not preclude SARS-Cov-2 infection and should not be used as the sole basis for treatment or other patient management decisions. A negative result may occur with  improper specimen collection/handling, submission of specimen other than nasopharyngeal swab, presence of viral mutation(s) within the areas targeted by this assay, and inadequate number of viral copies(<138 copies/mL). A negative result must be combined with clinical observations, patient history, and epidemiological information. The expected result is Negative.  Fact Sheet for Patients:  EntrepreneurPulse.com.au  Fact Sheet for Healthcare Providers:  IncredibleEmployment.be  This test is no t yet approved or cleared by the [place] FDA and  has been authorized for detection and/or diagnosis of SARS-CoV-2 by FDA under an Emergency Use Authorization (EUA). This EUA will remain  in effect (meaning this test can be used) for the duration of the COVID-19 declaration under Section 564(b)(1) of the Act, 21 U.S.C.section  360bbb-3(b)(1), unless the authorization is terminated  or revoked sooner.       Influenza A by PCR NEGATIVE NEGATIVE Final   Influenza B by PCR NEGATIVE NEGATIVE Final    Comment: (NOTE) The Xpert Xpress SARS-CoV-2/FLU/RSV plus assay is intended as an aid in the diagnosis of influenza from Nasopharyngeal swab specimens and should not be used as a sole basis for treatment. Nasal washings and aspirates are unacceptable for Xpert Xpress SARS-CoV-2/FLU/RSV testing.  Fact Sheet for Patients: EntrepreneurPulse.com.au  Fact Sheet for Healthcare Providers: IncredibleEmployment.be  This test is not yet approved or cleared by the [place] FDA and has been authorized for detection and/or diagnosis of SARS-CoV-2 by FDA under an Emergency Use Authorization (EUA). This EUA will remain in effect (meaning this test can be used) for the duration of the COVID-19 declaration under Section 564(b)(1) of the Act, 21 U.S.C. section 360bbb-3(b)(1), unless the authorization is terminated or revoked.  Performed at [facility], [place] [address], [place], [place] [address]   Culture, blood (routine x 2)     Status: None (Preliminary result)   Collection Time: 10/31/21  1:56 PM   Specimen: BLOOD RIGHT HAND  Result Value Ref Range Status   Specimen Description BLOOD RIGHT HAND  Final   Special Requests   Final    BOTTLES DRAWN AEROBIC AND ANAEROBIC Blood Culture adequate volume   Culture   Final    NO GROWTH <  24 HOURS Performed at [facility], [place] [address] [place], [place] [address]    Report Status PENDING  Incomplete  Culture, blood (routine x 2)     Status: None (Preliminary result)   Collection Time: 10/31/21  2:03 PM   Specimen: BLOOD RIGHT WRIST  Result Value Ref Range Status   Specimen Description BLOOD RIGHT WRIST  Final   Special Requests   Final    BOTTLES DRAWN AEROBIC AND ANAEROBIC Blood Culture adequate volume   Culture   Final     NO GROWTH < 24 HOURS Performed at [facility], [place] [address], [place], [place] [address]    Report Status PENDING  Incomplete     Janene Madeira, MSN, NP-C [facility] [email or web address] Pager: [phone] Office: [phone] [facility] Main Line: [facility] Welcome

## 2021-11-01 NOTE — Progress Notes (Signed)
Progress Note  Patient Name: [name] Young. Date of Encounter: 11/01/2021  [facility] Cardiologist: Nelva Bush, MD   Subjective   Wife at bedside. Patient denies any chest pain, palpitations, SOB. Has a good understanding of his hospital course and treatment plan. Understands his heart conditions, but denies feeling any symptoms of them.    Inpatient Medications    Scheduled Meds:  apixaban  5 mg Oral BID   chlorhexidine  15 mL Mouth Rinse BID   Chlorhexidine Gluconate Cloth  6 each Topical Q0600   dapagliflozin propanediol  5 mg Oral Daily   fenofibrate  160 mg Oral Daily   insulin aspart  0-15 Units Subcutaneous TID WC   lactulose  30 g Oral Daily   mouth rinse  15 mL Mouth Rinse q12n4p   metoprolol succinate  50 mg Oral Daily   potassium chloride  20 mEq Oral Daily   rosuvastatin  10 mg Oral Daily   sacubitril-valsartan  1 tablet Oral BID   sodium chloride flush  10-40 mL Intracatheter Q12H   tamsulosin  0.4 mg Oral Daily   Continuous Infusions:  ampicillin-sulbactam (UNASYN) IV 3 g (11/01/21 0857)   vancomycin     PRN Meds: docusate sodium, hydrALAZINE, melatonin, ondansetron (ZOFRAN) IV, polyethylene glycol   Vital Signs    Vitals:   10/31/21 2335 11/01/21 0423 11/01/21 0729 11/01/21 0800  BP: (!) 131/59 (!) 122/41 (!) 141/50   Pulse: 70 85 78   Resp: (!) 22 (!) 24 (!) 23   Temp: 99 F (37.2 C) 98.1 F (36.7 C)  99.1 F (37.3 C)  TempSrc: Axillary Axillary  Axillary  SpO2: 93% 94% 94%   Weight:  121.3 kg    Height:        Intake/Output Summary (Last 24 hours) at 11/01/2021 1002 Last data filed at 10/31/2021 2104 Gross per 24 hour  Intake 200 ml  Output --  Net 200 ml   Last 3 Weights 11/01/2021 10/30/2021 10/28/2021  Weight (lbs) 267 lb 6.7 oz 268 lb 1.3 oz 264 lb 8.8 oz  Weight (kg) 121.3 kg 121.6 kg 120 kg      Telemetry    Sinus rhythm, PVCs and PACs - Personally Reviewed  ECG    No new tracings since 2/17 - Personally  Reviewed  Physical Exam   GEN: No acute distress.   Neck: Mild JVD Cardiac: RRR, no murmurs, rubs, or gallops.  Respiratory: Mild rales in Bilateral lung bases  GI: Soft, nontender, non-distended  MS: No edema; No deformity. Neuro:  Nonfocal  Psych: Normal affect   Labs    High Sensitivity Troponin:   Recent Labs  Lab 10/23/21 0334 10/23/21 0559  TROPONINIHS 68* 142*     Chemistry Recent Labs  Lab 10/30/21 0127 10/31/21 0145 11/01/21 0056  NA 138 138 138  K 3.6 3.6 3.6  CL 105 105 106  CO2 23 25 25   GLUCOSE 155* 161* 132*  BUN 25* 22 20  CREATININE 1.08 1.11 1.18  CALCIUM 7.6* 7.7* 7.7*  MG 1.7 2.0 2.0  PROT 4.9* 4.8* 5.0*  ALBUMIN 2.0* 1.9* 1.9*  AST 27 24 64*  ALT 63* 46* 51*  ALKPHOS 85 79 77  BILITOT 2.0* 1.3* 1.2  GFRNONAA >60 >60 >60  ANIONGAP 10 8 7     Lipids No results for input(s): CHOL, TRIG, HDL, LABVLDL, LDLCALC, CHOLHDL in the last 168 hours.  Hematology Recent Labs  Lab 10/30/21 0127 10/31/21 0145 11/01/21 5462  WBC 15.3* 14.4* 15.5*  RBC 3.49* 3.22* 3.15*  HGB 10.0* 9.3* 8.9*  HCT 30.7* 28.5* 28.6*  MCV 88.0 88.5 90.8  MCH 28.7 28.9 28.3  MCHC 32.6 32.6 31.1  RDW 16.9* 17.1* 17.2*  PLT 148* 184 218   Thyroid No results for input(s): TSH, FREET4 in the last 168 hours.  BNP Recent Labs  Lab 10/29/21 0209 10/30/21 0127  BNP 727.2* 394.5*    DDimer No results for input(s): DDIMER in the last 168 hours.   Radiology    DG Chest Port 1 View  Result Date: 10/31/2021 CLINICAL DATA:  Shortness of breath in a 81 year old male. EXAM: PORTABLE CHEST 1 VIEW COMPARISON:  Comparison with October 28, 2021. FINDINGS: EKG leads project over the chest. Study limited by patient condition by report. Median sternotomy changes and changes of coronary revascularization. Heart size is stable. No lobar consolidative process. Minimal LEFT basilar airspace disease with slightly improved aeration in the chest compared to previous imaging. No visible  pneumothorax. On limited assessment there is no acute skeletal finding. IMPRESSION: Minimal LEFT basilar airspace disease with slightly improved aeration in the chest compared to previous imaging. No other interval change. Electronically Signed   By: Zetta Bills M.D.   On: 10/31/2021 11:52    Cardiac Studies   Echocardiogram 10/23/2021  1. Left ventricular ejection fraction, by estimation, is 40 to 45%. The  left ventricle has mildly decreased function. The left ventricle  demonstrates global hypokinesis. Left ventricular diastolic parameters are  consistent with Grade II diastolic  dysfunction (pseudonormalization). Elevated left ventricular end-diastolic  pressure.   2. Right ventricular systolic function is normal. The right ventricular  size is normal. There is normal pulmonary artery systolic pressure. The  estimated right ventricular systolic pressure is 15.7 mmHg.   3. Left atrial size was mildly dilated.   4. The mitral valve is degenerative. Trivial mitral valve regurgitation.  No evidence of mitral stenosis.   5. The aortic valve is calcified. There is moderate calcification of the  aortic valve. There is moderate thickening of the aortic valve. Aortic  valve regurgitation is not visualized. Aortic valve  sclerosis/calcification is present, without any evidence  of aortic stenosis.   6. There is mild dilatation of the aortic root, measuring 41 mm. There is  mild dilatation of the ascending aorta, measuring 38 mm.   7. The inferior vena cava is dilated in size with >50% respiratory  variability, suggesting right atrial pressure of 8 mmHg.   TEE 10/28/2021  1. Left ventricular ejection fraction, by estimation, is 40 to 45%. The  left ventricle has mildly decreased function. The left ventricle  demonstrates global hypokinesis.   2. Right ventricular systolic function is normal. The right ventricular  size is normal.   3. No left atrial/left atrial appendage thrombus was  detected.   4. Diffuse thickening and increased echogenicity of the anterior mitral  leaflet with small mobile vegetation, consistent with endocarditis. The  mitral valve is abnormal. Mild mitral valve regurgitation.   5. Small vegetation on the mitral valve.   6. No vegetation, Lambl's noted on the leaflet tips. The aortic valve is  tricuspid. There is moderate thickening of the aortic valve. Aortic valve  regurgitation is not visualized.   7. Aortic dilatation noted. There is borderline dilatation of the  ascending aorta, measuring 38 mm.   Conclusion(s)/Recommendation(s): Findings are concerning for  vegetation/infective endocarditis as detailed above.   Patient Profile     81 y.o. male  hx of 3v CAD with CABG 2019, with post op a fib, HTN, HLD, DM-2, CKD 3, now admitted with abd pain due to biliary tract obstruction and ascending cholangitis undergoing ERCP removing 2 stones, sepsis and a fib who is being seen 10/24/2021 for the evaluation of atrial fib and new CM with EF 40-45%  Assessment & Plan    Atrial Fibrillation  - S/p TEE on 10/28/2021 that showed no left atrial thrombus. Did show vegetation on mitral valve suspicious for endocarditis  - Spontaneously converted to sinus rhythm on 2/17, has been in sinus since (planned for cardioversion after TEE on 2/17, but postop EKG showed sinus rhythm)  - On eliquis 5 mg BID (since all GI procedures complete)  - Continue toprol-xl 50 mg daily  - BP and HR tolerating BB well  - Question need for long term anticoagulation as afib was noted in the setting of sepsis, spontaneously converted and patient is holding SR. Will continue to assess [facility] needs at outpatient follow up appointments.   Acute Systolic Heart Failure  - Echo this admission showed EF 40-45%, grade 2 DD  - In the setting of biliary tract obstruction, sepsis, rapid A-fib - Continue BB, entresto - Started farxiga yesterday, creatinine stable this AM  - Appears intravascularly  dry, not on lasix  - Will need outpatient follow up and repeat echocardiogram, can then decide on need for ischemic eval   Sepsis E. Coli and streptococcus bacteremia  Endocarditis  - In setting of cholangitis, s/p ERCP  - Tee showed small angerior MV leaflet vegetation, mild MR  - Abx per ID - No plans for surgery at this time, 6 weeks abx   History of CAD with prior CABG  - No ischemic symptoms, on BB and home cholesterol medications  - On ASA PTA, stopped with initiation of eliquis   NSVT - Per telemetry, there have not been new runs of NSVT overnight/this AM. Continues to have frequent PVCs  - K 3.6, on supplementation  - Mag 2.0  - Continue BB       For questions or updates, please contact [facility] Please consult [email or web address] for contact info under        Signed, Margie Billet, PA-C  11/01/2021, 10:02 AM

## 2021-11-01 NOTE — Evaluation (Signed)
Clinical/Bedside Swallow Evaluation Patient Details  Name: [name] Young. MRN: [id number] Date of Birth: 1941-08-29  Today's Date: 11/01/2021 Time: SLP Start Time (ACUTE ONLY): 0905 SLP Stop Time (ACUTE ONLY): 0920 SLP Time Calculation (min) (ACUTE ONLY): 15 min  Past Medical History:  Past Medical History:  Diagnosis Date   Arthritis    Cataract    lens implant bilateral   Chronic kidney disease    kidney stones   Coronary artery disease    Dyspnea    Dysrhythmia    GERD (gastroesophageal reflux disease)    History of cholelithiasis    History of kidney stones    ca ox [name] @ [facility]) now [employer]   History of pneumonia    HLD (hyperlipidemia)    HTN (hypertension)    Jaundice    age 58   Pneumonia    years ago    T2DM (type 2 diabetes mellitus) ([place]) 2010   Past Surgical History:  Past Surgical History:  Procedure Laterality Date   CARPAL TUNNEL RELEASE Bilateral    CATARACT EXTRACTION, BILATERAL     COLONOSCOPY  11/2012   11 adenomatous polyps, diverticulosis, rec rpt 1 yr Ardis Hughs)   COLONOSCOPY  12/2013   3 polyps, diverticulosis, rec rpt 3 yrs Ardis Hughs)   COLONOSCOPY  06/2019   6 polyps (TA), diverticulosis, f/u left open ended Ardis Hughs)   CORONARY ARTERY BYPASS GRAFT N/A 01/18/2018   Procedure: CORONARY ARTERY BYPASS GRAFTING (CABG) x 3; Using Left Internal Mammary Artery, and Right Greater Saphenous Vein harvested Endoscopically, Coronary Artery Endarterectomy;  Surgeon: Ivin Poot, MD;  Location: [place];  Service: Open Heart Surgery;  Laterality: N/A;   ERCP N/A 10/23/2021   Procedure: ENDOSCOPIC RETROGRADE CHOLANGIOPANCREATOGRAPHY (ERCP);  Surgeon: Ladene Artist, MD;  Location: [facility] ENDOSCOPY;  Service: Endoscopy;  Laterality: N/A;   KNEE CARTILAGE SURGERY Left    LEFT HEART CATH AND CORONARY ANGIOGRAPHY N/A 12/21/2017   Procedure: LEFT HEART CATH AND CORONARY ANGIOGRAPHY;  Surgeon: Nelva Bush, MD;  Location: [place] CV LAB;  Service:  Cardiovascular;  Laterality: N/A;   LITHOTRIPSY     REMOVAL OF STONES  10/23/2021   Procedure: REMOVAL OF STONES;  Surgeon: Ladene Artist, MD;  Location: [facility] ENDOSCOPY;  Service: Endoscopy;;   SPHINCTEROTOMY  10/23/2021   Procedure: Joan Mayans;  Surgeon: Ladene Artist, MD;  Location: [place];  Service: Endoscopy;;   TEE WITHOUT CARDIOVERSION N/A 01/18/2018   Procedure: TRANSESOPHAGEAL ECHOCARDIOGRAM (TEE);  Surgeon: Prescott Gum, Collier Salina, MD;  Location: [place];  Service: Open Heart Surgery;  Laterality: N/A;   TEE WITHOUT CARDIOVERSION N/A 10/28/2021   Procedure: TRANSESOPHAGEAL ECHOCARDIOGRAM (TEE);  Surgeon: Pixie Casino, MD;  Location: [facility] ENDOSCOPY;  Service: Cardiovascular;  Laterality: N/A;   UMBILICAL HERNIA REPAIR     with mesh   HPI:  81 y.o. male presented to the ED 10/23/21 with complaints of abdominal pain with associated nausea and vomiting that began the day before. In ED pt found to be in A-fib RVR, hypertensive, and tachypnea. CTA chest/abd/pelvis reveled dilated bile duct, acute cholelithiasis without cholecystis found to have cholangitis status post ERCP with gastroenterology on 10/23/2021.  His blood cultures on admission are positive for E. coli and Streptococcus species.  PMH significant for CAD s/p CAGB 2019 A-fib,, HTN. HLD, diabetes, GERD, and CKD    Assessment / Plan / Recommendation  Clinical Impression  Patient did not present with any clinical s/s of dysphagia as per this bedside/clinical swallow evaluation. Patient  was alert but fatigued and would close his eyes intermittently during evaluation. His wife was at bedside and she and patient both deny any swallowing difficulties. Wife reported that at home, patient has a "spit cup" for spitting out phlegm/mucous. SLP observed patient with cup sips of thin liquids (water); swallow initiation appeared timely and no overt s/s aspiration or penetration. Patient's voice was mildy low in intensity but was clear and remained  clear throughout session. SLP recommended to spouse and patient to have patient drink water througout day to help with thinning down mucous/phlegm. No further skilled SLP services warranted at this time but please reorder if patient demonstrating and any concerns for dysphagia. SLP Visit Diagnosis: Dysphagia, unspecified (R13.10)    Aspiration Risk  No limitations;Mild aspiration risk    Diet Recommendation Regular;Thin liquid   Liquid Administration via: Cup;Straw Medication Administration: Whole meds with liquid Supervision: Patient able to self feed Compensations: Slow rate;Small sips/bites Postural Changes: Seated upright at 90 degrees    Other  Recommendations Oral Care Recommendations: Oral care BID    Recommendations for follow up therapy are one component of a multi-disciplinary discharge planning process, led by the attending physician.  Recommendations may be updated based on patient status, additional functional criteria and insurance authorization.  Follow up Recommendations No SLP follow up      Assistance Recommended at Discharge None  Functional Status Assessment Patient has had a recent decline in their functional status and demonstrates the ability to make significant improvements in function in a reasonable and predictable amount of time.  Frequency and Duration     N/A       Prognosis    N/A     Swallow Study   General Date of Onset: 10/31/21 HPI: 81 y.o. male presented to the ED 10/23/21 with complaints of abdominal pain with associated nausea and vomiting that began the day before. In ED pt found to be in A-fib RVR, hypertensive, and tachypnea. CTA chest/abd/pelvis reveled dilated bile duct, acute cholelithiasis without cholecystis found to have cholangitis status post ERCP with gastroenterology on 10/23/2021.  His blood cultures on admission are positive for E. coli and Streptococcus species.  PMH significant for CAD s/p CAGB 2019 A-fib,, HTN. HLD, diabetes,  GERD, and CKD Type of Study: Bedside Swallow Evaluation Previous Swallow Assessment: none found Diet Prior to this Study: Regular;Thin liquids Temperature Spikes Noted: No Respiratory Status: Room air History of Recent Intubation: No Behavior/Cognition: Alert;Cooperative;Lethargic/Drowsy;Pleasant mood Oral Cavity Assessment: Within Functional Limits Oral Care Completed by SLP: No Oral Cavity - Dentition: Adequate natural dentition Vision: Functional for self-feeding Self-Feeding Abilities: Able to feed self Patient Positioning: Upright in bed Baseline Vocal Quality: Other (comment) (mildly weak sounding voice) Volitional Cough: Strong Volitional Swallow: Able to elicit    Oral/Motor/Sensory Function Overall Oral Motor/Sensory Function: Within functional limits   Ice Chips     Thin Liquid Thin Liquid: Within functional limits Presentation: Cup;Self Fed    Nectar Thick     Honey Thick     Puree Puree: Not tested   Solid     Solid: Not tested     Sonia Baller, MA, CCC-SLP Speech Therapy

## 2021-11-01 NOTE — Progress Notes (Signed)
PROGRESS NOTE                                                                                                                                                                                                             Patient Demographics:    [name] Young, is a 81 y.o. male, DOB - 01/21/1941, WUJ:[id number]  Outpatient Primary MD for the patient is Ria Bush, MD    LOS - 9  Admit date - 10/23/2021    Chief Complaint  Patient presents with   Abdominal Pain       Brief Narrative (HPI from H&P)     81 year old gentleman with a history of coronary artery disease status post CABG 2019, paroxysmal atrial fibrillation, diabetes, GERD, CKD who presented with abdominal pain, found to have ascending cholangitis and bacteremia, he was kept in ICU was seen by GI underwent ERCP with gallstone extraction, he was also seen by ID for bacteremia.  He was transferred to my service on 10/28/2021 on day 5 of his hospital admission   Subjective:   Patient in bed, appears comfortable, denies any headache, no fever, no chest pain or pressure, no shortness of breath , no abdominal pain. No new focal weakness.    Assessment  & Plan :   Sepsis and bacteremia with Streptococcus infantarius and E. Coli and now confirmed mitral valve endocarditis - caused by ascending cholangitis, had gallstones was seen by GI and he underwent ERCP with 2 gallbladder stones removed with sphincterotomy on 10/23/2021, seen by ID currently on IV antibiotics, TTE was stable unfortunately TEE done on 10/28/2021 does confirm mitral valve endocarditis, case discussed with ID PICC line placed on 10/28/2021 for prolonged IV antibiotics.  Thankfully sepsis pathophysiology has resolved and CRP and procalcitonin are trending down.  He is currently on Unasyn x 6 weeks - ID. Overall frail, longterm prognosis looks poor, patient and wife informed.  Patient spiked an abrupt fever  of 102 in the afternoon of 10/31/2021 with rigors suspicious for gram-negative bacteremia, he was seen again with ID, chest x-ray stable, COVID-negative, procalcitonin and CRP and leukocyte count have gone up, looks like he had repeat transient bacteremia, repeat cultures ordered and pending.  ID has added vancomycin on 10/31/2021 to Unasyn.  We will continue to monitor follow repeat cultures results.    2.  AKI on  CKD 3B.  Seen by nephrology, AKI due to ATN from sepsis.  Improved after hydration and hemodynamic stability.  Creatinine peaked at 4.5 now down to 1.5.  3.   Elevated LFTs due to #1 coming down.  4.  Acute drop in systolic function of heart with EF dropping to 45% down from 55% few years ago.  Likely due to sepsis.  Currently on beta-blocker, seen by cardiology.  Avoiding ACE/ARB due to renal failure.  Compensated from this fluid standpoint. Cards on board.  5.  Paroxysmal atrial fibrillation with [place] vas 2 score of greater than 3.  On combination of Lopressor and full dose Lovenox which we will hold on 10/30/2021 due to PICC line site hematoma, resumed Eliquis on 10/31/2021.  6.  Severe of weakness and deconditioning.  PT OT and monitor.  May require SNF.  7.  Hypokalemia.  Replaced.  8.  BPH.  On Flomax continue.  9.  DM type II.  For now sliding scale.  A1c satisfactory at 6.1  CBG (last 3)  Recent Labs    10/31/21 1800 10/31/21 2015 11/01/21 0731  GLUCAP 167* 153* 146*         Condition - Extremely Guarded  Family Communication  :  wife [name] Young [phone] - bedside 10/28/21, phone 10/30/21, wife and daughter bedside on 10/31/2021, again explained poor prognosis long-term  Code Status :  Full  Consults  :  PCCM, ID, Cards, Pall. Care  PUD Prophylaxis :    Procedures  :     CT Head - non acute  ERCP with removal of 2 gallbladder stones and sphincterotomy on 10/23/2021.    CT Abd & Pelvis - Chest -  1. No evidence of acute aortic syndrome. 2. No acute  findings in the thorax to account for the patient's symptoms. 3. 5 mm calculus at the right ureteropelvic junction. At this time, there is no proximal hydronephrosis to indicate urinary tract obstruction. 4. Cholelithiasis without evidence of acute cholecystitis. 5. Dilatation of the common bile duct. No intrahepatic biliary ductal dilatation to clearly indicate biliary tract obstruction. Additionally, there is no calcified choledocholithiasis. This is of uncertain etiology and significance, and could be age related, however, correlation with liver function tests is recommended. If there is clinical concern for biliary tract obstruction, further evaluation with abdominal MRI with and without IV gadolinium with MRCP would be recommended. 6. Aortic atherosclerosis, in addition to left main and three-vessel coronary artery disease. Status post median sternotomy for CABG including LIMA to the LAD. 7. There are calcifications of the aortic valve. Echocardiographic correlation for evaluation of potential valvular dysfunction may be warranted if clinically indicated. 8. Additional incidental findings, as above.  TTE -  EF 45% with global hypokinesis.  TEE -   Suspected endocarditis of the mitral valve with increased echogenicty, thickening and hockystick motion as well as a small mobile vegetation. There is mild MR. Findings are new compared to TEE in 2019 which was personally reviewed. Dilated ascending aorta to 38 mm. Thickening of the aortic valve leaflets without mobile vegetation. LVEF 40-45%, global hypokinesis. Pre-op EKG demonstrated the patient was back in sinus rhythm with PVC's, therefore, cardioversion was not pursued.      Disposition Plan  :    Status is: Inpatient  DVT Prophylaxis  :  Lovenox  SCDs Start: 10/23/21 0909 apixaban (ELIQUIS) tablet 5 mg  Lab Results  Component Value Date   PLT 218 11/01/2021    Diet :  Diet Order  Diet regular Room service  appropriate? Yes; Fluid consistency: Thin  Diet effective now                    Inpatient Medications  Scheduled Meds:  apixaban  5 mg Oral BID   chlorhexidine  15 mL Mouth Rinse BID   Chlorhexidine Gluconate Cloth  6 each Topical Q0600   dapagliflozin propanediol  5 mg Oral Daily   fenofibrate  160 mg Oral Daily   insulin aspart  0-15 Units Subcutaneous TID WC   lactulose  30 g Oral Daily   mouth rinse  15 mL Mouth Rinse q12n4p   metoprolol succinate  50 mg Oral Daily   potassium chloride  20 mEq Oral Daily   rosuvastatin  10 mg Oral Daily   sacubitril-valsartan  1 tablet Oral BID   sodium chloride flush  10-40 mL Intracatheter Q12H   tamsulosin  0.4 mg Oral Daily   Continuous Infusions:  ampicillin-sulbactam (UNASYN) IV 3 g (11/01/21 0857)   vancomycin     PRN Meds:.docusate sodium, hydrALAZINE, melatonin, ondansetron (ZOFRAN) IV, polyethylene glycol    Time Spent in minutes  30   Lala Lund M.D on 11/01/2021 at 11:07 AM  To page go to [email or web address]   [facility] -  Office  [phone]  See all Orders from today for further details    Objective:   Vitals:   10/31/21 2335 11/01/21 0423 11/01/21 0729 11/01/21 0800  BP: (!) 131/59 (!) 122/41 (!) 141/50   Pulse: 70 85 78   Resp: (!) 22 (!) 24 (!) 23   Temp: 99 F (37.2 C) 98.1 F (36.7 C)  99.1 F (37.3 C)  TempSrc: Axillary Axillary  Axillary  SpO2: 93% 94% 94%   Weight:  121.3 kg    Height:        Wt Readings from Last 3 Encounters:  11/01/21 121.3 kg  06/07/21 118 kg  01/28/21 118.8 kg     Intake/Output Summary (Last 24 hours) at 11/01/2021 1107 Last data filed at 10/31/2021 2104 Gross per 24 hour  Intake 200 ml  Output --  Net 200 ml     Physical Exam  Awake Alert, No new F.N deficits, Normal affect [place].AT,PERRAL Supple Neck, No JVD,   Symmetrical Chest wall movement, Good air movement bilaterally, CTAB RRR,No Gallops, Rubs or new Murmurs,  +ve B.Sounds, Abd Soft, No  tenderness,   Trace edema. Left arm PICC line with a large bruise around the site    Data Review:    CBC Recent Labs  Lab 10/28/21 0317 10/29/21 0209 10/30/21 0127 10/31/21 0145 11/01/21 0056  WBC 11.0* 13.7* 15.3* 14.4* 15.5*  HGB 11.0* 10.1* 10.0* 9.3* 8.9*  HCT 32.0* 31.0* 30.7* 28.5* 28.6*  PLT 92* 124* 148* 184 218  MCV 85.1 86.4 88.0 88.5 90.8  MCH 29.3 28.1 28.7 28.9 28.3  MCHC 34.4 32.6 32.6 32.6 31.1  RDW 16.5* 16.5* 16.9* 17.1* 17.2*  LYMPHSABS  --  1.6 1.9 1.8 1.5  MONOABS  --  1.1* 0.9 0.8 0.7  EOSABS  --  0.2 0.2 0.2 0.2  BASOSABS  --  0.1 0.0 0.0 0.0    Electrolytes Recent Labs  Lab 10/26/21 0112 10/26/21 0811 10/27/21 0852 10/28/21 0317 10/29/21 0209 10/30/21 0127 10/31/21 0145 11/01/21 0056  NA 141  --  144 142 141 138 138 138  K 3.6  --  3.1* 3.2* 3.5 3.6 3.6 3.6  CL 106  --  109 108 107  105 105 106  CO2 19*  --  21* 24 24 23 25 25   GLUCOSE 114*  --  139* 157* 174* 155* 161* 132*  BUN 71*  --  61* 49* 35* 25* 22 20  CREATININE 3.65*  --  1.90* 1.51* 1.14 1.08 1.11 1.18  CALCIUM 7.8*  --  8.0* 7.9* 7.8* 7.6* 7.7* 7.7*  AST 357*  --   --   --  32 27 24 64*  ALT 515*  --   --   --  104* 63* 46* 51*  ALKPHOS 141*  --   --   --  97 85 79 77  BILITOT 1.9*  --   --   --  1.8* 2.0* 1.3* 1.2  ALBUMIN 2.7*  --  2.7*  --  2.2* 2.0* 1.9* 1.9*  MG 2.6*  --   --   --  1.9 1.7 2.0 2.0  CRP  --   --   --  25.4* 16.7* 14.2* 14.5* 15.7*  PROCALCITON  --   --   --  13.32 5.90 3.48 2.26 3.17  AMMONIA  --  26  --   --   --   --   --   --   BNP  --   --   --   --  727.2* 394.5*  --   --     ------------------------------------------------------------------------------------------------------------------ No results for input(s): CHOL, HDL, LDLCALC, TRIG, CHOLHDL, LDLDIRECT in the last 72 hours.  Lab Results  Component Value Date   HGBA1C 6.1 (H) 10/23/2021    No results for input(s): TSH, T4TOTAL, T3FREE, THYROIDAB in the last 72 hours.  Invalid  input(s): FREET3 ------------------------------------------------------------------------------------------------------------------ ID Labs Recent Labs  Lab 10/28/21 0317 10/29/21 0209 10/30/21 0127 10/31/21 0145 11/01/21 0056  WBC 11.0* 13.7* 15.3* 14.4* 15.5*  PLT 92* 124* 148* 184 218  CRP 25.4* 16.7* 14.2* 14.5* 15.7*  PROCALCITON 13.32 5.90 3.48 2.26 3.17  CREATININE 1.51* 1.14 1.08 1.11 1.18   Cardiac Enzymes No results for input(s): CKMB, TROPONINI, MYOGLOBIN in the last 168 hours.  Invalid input(s): CK   Radiology Reports DG Chest Port 1 View  Result Date: 10/31/2021 CLINICAL DATA:  Shortness of breath in a 81 year old male. EXAM: PORTABLE CHEST 1 VIEW COMPARISON:  Comparison with October 28, 2021. FINDINGS: EKG leads project over the chest. Study limited by patient condition by report. Median sternotomy changes and changes of coronary revascularization. Heart size is stable. No lobar consolidative process. Minimal LEFT basilar airspace disease with slightly improved aeration in the chest compared to previous imaging. No visible pneumothorax. On limited assessment there is no acute skeletal finding. IMPRESSION: Minimal LEFT basilar airspace disease with slightly improved aeration in the chest compared to previous imaging. No other interval change. Electronically Signed   By: Zetta Bills M.D.   On: 10/31/2021 11:52   DG Chest Port 1 View  Result Date: 10/28/2021 CLINICAL DATA:  Shortness of breath EXAM: PORTABLE CHEST 1 VIEW COMPARISON:  Radiograph 10/23/2021 FINDINGS: Unchanged enlarged cardiac silhouette. Prior median sternotomy and CABG. There are left basilar opacities. Small bilateral pleural effusions, left greater than right. No visible pneumothorax. No acute osseous abnormality. IMPRESSION: Small bilateral pleural effusions, left greater than right. Left basilar opacities could be atelectasis or infection. Unchanged cardiomegaly. Electronically Signed   By: Maurine Simmering  M.D.   On: 10/28/2021 11:58   ECHO TEE  Result Date: 10/28/2021    TRANSESOPHOGEAL ECHO REPORT   Patient Name:   [name] Young  Rolanda Jay. Date of Exam: 10/28/2021 Medical Rec #:  [id number]            Height:       71.0 in Accession #:    [id number]           Weight:       264.6 lb Date of Birth:  [birth year]           BSA:          2.375 m Patient Age:    61 years             BP:           169/70 mmHg Patient Gender: M                    HR:           77 bpm. Exam Location:  Inpatient Procedure: Transesophageal Echo, Cardiac Doppler and Color Doppler Indications:     Bacteremia  History:         Patient has prior history of Echocardiogram examinations, most                  recent 10/24/2021. CAD, Abnormal ECG and Prior CABG,                  Arrythmias:Atrial Fibrillation, Signs/Symptoms:Bacteremia; Risk                  Factors:Diabetes.  Sonographer:     Roseanna Rainbow RDCS Referring Phys:  [id number] [facility]ERT[facility] Diagnosing Phys: Lyman Bishop MD PROCEDURE: After discussion of the risks and benefits of a TEE, an informed consent was obtained from the patient. The transesophogeal probe was passed without difficulty through the esophogus of the patient. Imaged were obtained with the patient in a supine position. Sedation performed by different physician. The patient was monitored while under deep sedation. Anesthestetic sedation was provided intravenously by Anesthesiology: 168mg  of Propofol. The patient developed no complications during the procedure. IMPRESSIONS  1. Left ventricular ejection fraction, by estimation, is 40 to 45%. The left ventricle has mildly decreased function. The left ventricle demonstrates global hypokinesis.  2. Right ventricular systolic function is normal. The right ventricular size is normal.  3. No left atrial/left atrial appendage thrombus was detected.  4. Diffuse thickening and increased echogenicity of the anterior mitral leaflet with small mobile vegetation, consistent with endocarditis.  The mitral valve is abnormal. Mild mitral valve regurgitation.  5. Small vegetation on the mitral valve.  6. No vegetation, Lambl's noted on the leaflet tips. The aortic valve is tricuspid. There is moderate thickening of the aortic valve. Aortic valve regurgitation is not visualized.  7. Aortic dilatation noted. There is borderline dilatation of the ascending aorta, measuring 38 mm. Conclusion(s)/Recommendation(s): Findings are concerning for vegetation/infective endocarditis as detailed above. FINDINGS  Left Ventricle: Left ventricular ejection fraction, by estimation, is 40 to 45%. The left ventricle has mildly decreased function. The left ventricle demonstrates global hypokinesis. The left ventricular internal cavity size was normal in size. Right Ventricle: The right ventricular size is normal. No increase in right ventricular wall thickness. Right ventricular systolic function is normal. Left Atrium: Left atrial size was normal in size. No left atrial/left atrial appendage thrombus was detected. Right Atrium: Right atrial size was normal in size. Pericardium: There is no evidence of pericardial effusion. Mitral Valve: Diffuse thickening and increased echogenicity of the anterior mitral leaflet with small mobile vegetation, consistent with endocarditis. The mitral valve  is abnormal. A small vegetation is seen on the anterior mitral leaflet. The MV vegetation measures 5 mm x 5 mm. Mild mitral valve regurgitation. Tricuspid Valve: The tricuspid valve is grossly normal. Tricuspid valve regurgitation is trivial. Aortic Valve: No vegetation, Lambl's noted on the leaflet tips. The aortic valve is tricuspid. There is moderate thickening of the aortic valve. Aortic valve regurgitation is not visualized. Pulmonic Valve: The pulmonic valve was normal in structure. Pulmonic valve regurgitation is not visualized. Aorta: Aortic dilatation noted. There is borderline dilatation of the ascending aorta, measuring 38 mm.  IAS/Shunts: No atrial level shunt detected by color flow Doppler. Lyman Bishop MD Electronically signed by Lyman Bishop MD Signature Date/Time: 10/28/2021/2:25:50 PM    Final    [place] EKG SITE RITE  Result Date: 10/28/2021 If Site Rite image not attached, placement could not be confirmed due to current cardiac rhythm.

## 2021-11-01 NOTE — Progress Notes (Signed)
Occupational Therapy Treatment Patient Details Name: [name] Young. MRN: [id number] DOB: 1941-06-28 Today's Date: 11/01/2021   History of present illness 81 y.o. male presented to the ED 10/23/21 with complaints of abdominal pain with associated nausea and vomiting that began the day before. In ED pt found to be in A-fib RVR, hypertensive, and tachypnea. CTA chest/abd/pelvis reveled dilated bile duct, acute cholelithiasis without cholecystis found to have cholangitis status post ERCP with gastroenterology on 10/23/2021.  His blood cultures on admission are positive for E. coli and Streptococcus species.  PMH significant for CAD s/p CAGB 2019 A-fib,, HTN. HLD, diabetes, GERD, and CKD   OT comments  Pt progressing well towards established goals. Pt able to progress to using RW for basic transfers and limited mobility though still benefits from +2 assist to ensure safety. Pt quickly fatigues with these tasks. Pt continues to require extensive assist for LB ADLs. Wife endorses ability to assist with socks, pants around feet, etc if he were to DC home though requires pt to be able to stand/mobilize short distances without assist. Continue to rec SNF rehab with pt/wife hopeful he can progress home.    Recommendations for follow up therapy are one component of a multi-disciplinary discharge planning process, led by the attending physician.  Recommendations may be updated based on patient status, additional functional criteria and insurance authorization.    Follow Up Recommendations  Skilled nursing-short term rehab (<3 hours/day)    Assistance Recommended at Discharge Frequent or constant Supervision/Assistance  Patient can return home with the following  Two people to help with walking and/or transfers;Two people to help with bathing/dressing/bathroom   Equipment Recommendations  Hospital bed    Recommendations for Other Services      Precautions / Restrictions Precautions Precautions:  Fall Restrictions Weight Bearing Restrictions: No       Mobility Bed Mobility Overal bed mobility: Needs Assistance Bed Mobility: Supine to Sit     Supine to sit: Mod assist, +2 for safety/equipment     General bed mobility comments: Assist to advance B LEs and scoot hips. able to lift trunk well with use of bedrails    Transfers Overall transfer level: Needs assistance Equipment used: Rolling walker (2 wheels) Transfers: Sit to/from Stand, Bed to chair/wheelchair/BSC Sit to Stand: Mod assist, +2 physical assistance, +2 safety/equipment     Step pivot transfers: Min assist, +2 safety/equipment, +2 physical assistance     General transfer comment: Min A x 2 to power up from elevated bed with RW, benefits from hand placement cues. Pt able to step to recliner using RW with slow pace and increasing physical abilities. Trialed standing from lower recliner with Mod A x 2     Balance Overall balance assessment: Needs assistance Sitting-balance support: No upper extremity supported, Feet supported Sitting balance-Leahy Scale: Fair     Standing balance support: Bilateral upper extremity supported, During functional activity Standing balance-Leahy Scale: Poor                             ADL either performed or assessed with clinical judgement   ADL Overall ADL's : Needs assistance/impaired                     Lower Body Dressing: Total assistance Lower Body Dressing Details (indicate cue type and reason): to don socks. per wife, she often helps with task because pt wants her to Toilet Transfer: Moderate assistance;+2 for  physical assistance;+2 for safety/equipment;Stand-pivot;Rolling walker (2 wheels) Toilet Transfer Details (indicate cue type and reason): simulated to recliner           General ADL Comments: Focus on OOB endurance and facilitating functional mobility to maximize bathroom independence at home and per wife's valued goals. Pt able to  transfer using RW and progress mobility from recliner towards foot of bed (approx 76ft)    Extremity/Trunk Assessment Upper Extremity Assessment Upper Extremity Assessment: LUE deficits/detail LUE Deficits / Details: noted edema/bruising around forearm   Lower Extremity Assessment Lower Extremity Assessment: Defer to PT evaluation        Vision   Vision Assessment?: No apparent visual deficits   Perception     Praxis      Cognition Arousal/Alertness: Awake/alert Behavior During Therapy: WFL for tasks assessed/performed Overall Cognitive Status: Impaired/Different from baseline Area of Impairment: Problem solving, Following commands, Awareness, Safety/judgement                       Following Commands: Follows one step commands consistently, Follows one step commands with increased time Safety/Judgement: Decreased awareness of deficits Awareness: Emergent Problem Solving: Slow processing, Decreased initiation, Requires verbal cues, Difficulty sequencing, Requires tactile cues General Comments: Follows commands consistently; slower processing time        Exercises      Shoulder Instructions       General Comments VSS on RA. Wife present    Pertinent Vitals/ Pain       Pain Assessment Pain Assessment: No/denies pain  Home Living                                          Prior Functioning/Environment              Frequency  Min 2X/week        Progress Toward Goals  OT Goals(current goals can now be found in the care plan section)  Progress towards OT goals: Progressing toward goals  Acute Rehab OT Goals Patient Stated Goal: wife would like for pt to be able to walk from car to house, to/from bathroom OT Goal Formulation: With patient Time For Goal Achievement: 11/09/21 Potential to Achieve Goals: Good ADL Goals Pt Will Perform Lower Body Bathing: with mod assist;sit to/from stand Pt Will Transfer to Toilet: with mod  assist;with +2 assist;stand pivot transfer;bedside commode Additional ADL Goal #1: Pt to complete  bed mobility at Mod A in prep for ADL transfers Additional ADL Goal #2: Pt to demonstrate ability to sit EOB > 5 min with no more than Min A to maintain balance during functional tasks  Plan Discharge plan remains appropriate    Co-evaluation                 AM-PAC OT "6 Clicks" Daily Activity     Outcome Measure   Help from another person eating meals?: A Little Help from another person taking care of personal grooming?: A Little Help from another person toileting, which includes using toliet, bedpan, or urinal?: Total Help from another person bathing (including washing, rinsing, drying)?: A Lot Help from another person to put on and taking off regular upper body clothing?: A Lot Help from another person to put on and taking off regular lower body clothing?: Total 6 Click Score: 12    End of Session Equipment Utilized During Treatment: Rolling walker (  2 wheels);Gait belt  OT Visit Diagnosis: Unsteadiness on feet (R26.81);Other abnormalities of gait and mobility (R26.89);Muscle weakness (generalized) (M62.81)   Activity Tolerance Patient tolerated treatment well   Patient Left in chair;with call bell/phone within reach;with chair alarm set;with family/visitor present   Nurse Communication  (talked with NT)        Time: 9935-7017 OT Time Calculation (min): 25 min  Charges: OT General Charges $OT Visit: 1 Visit OT Treatments $Self Care/Home Management : 8-22 mins $Therapeutic Activity: 8-22 mins  Malachy Chamber, OTR/L Acute Rehab Services Office: [phone]   Layla Maw 11/01/2021, 12:45 PM

## 2021-11-02 DIAGNOSIS — B9620 Unspecified Escherichia coli [E. coli] as the cause of diseases classified elsewhere: Secondary | ICD-10-CM | POA: Diagnosis not present

## 2021-11-02 DIAGNOSIS — R7881 Bacteremia: Secondary | ICD-10-CM | POA: Diagnosis not present

## 2021-11-02 DIAGNOSIS — N179 Acute kidney failure, unspecified: Secondary | ICD-10-CM | POA: Diagnosis not present

## 2021-11-02 DIAGNOSIS — K8033 Calculus of bile duct with acute cholangitis with obstruction: Secondary | ICD-10-CM | POA: Diagnosis not present

## 2021-11-02 LAB — PROCALCITONIN: Procalcitonin: 1.69 ng/mL

## 2021-11-02 LAB — COMPREHENSIVE METABOLIC PANEL
ALT: 40 U/L (ref 0–44)
AST: 43 U/L — ABNORMAL HIGH (ref 15–41)
Albumin: 1.8 g/dL — ABNORMAL LOW (ref 3.5–5.0)
Alkaline Phosphatase: 74 U/L (ref 38–126)
Anion gap: 9 (ref 5–15)
BUN: 15 mg/dL (ref 8–23)
CO2: 22 mmol/L (ref 22–32)
Calcium: 7.6 mg/dL — ABNORMAL LOW (ref 8.9–10.3)
Chloride: 105 mmol/L (ref 98–111)
Creatinine, Ser: 1.11 mg/dL (ref 0.61–1.24)
GFR, Estimated: >60 mL/min (ref >60)
Glucose, Bld: 123 mg/dL — ABNORMAL HIGH (ref 70–99)
Potassium: 3.9 mmol/L (ref 3.5–5.1)
Sodium: 136 mmol/L (ref 135–145)
Total Bilirubin: 1.2 mg/dL (ref 0.3–1.2)
Total Protein: 5 g/dL — ABNORMAL LOW (ref 6.5–8.1)

## 2021-11-02 LAB — GLUCOSE, CAPILLARY
Glucose-Capillary: 168 mg/dL — ABNORMAL HIGH (ref 70–99)
Glucose-Capillary: 141 mg/dL — ABNORMAL HIGH (ref 70–99)
Glucose-Capillary: 142 mg/dL — ABNORMAL HIGH (ref 70–99)
Glucose-Capillary: 144 mg/dL — ABNORMAL HIGH (ref 70–99)

## 2021-11-02 LAB — CBC
HCT: 27.1 % — ABNORMAL LOW (ref 39.0–52.0)
Hemoglobin: 8.4 g/dL — ABNORMAL LOW (ref 13.0–17.0)
MCH: 28 pg (ref 26.0–34.0)
MCHC: 31 g/dL (ref 30.0–36.0)
MCV: 90.3 fL (ref 80.0–100.0)
Platelets: 270 10*3/uL (ref 150–400)
RBC: 3 MIL/uL — ABNORMAL LOW (ref 4.22–5.81)
RDW: 17.2 % — ABNORMAL HIGH (ref 11.5–15.5)
WBC: 11.8 10*3/uL — ABNORMAL HIGH (ref 4.0–10.5)
nRBC: 0 % (ref 0.0–0.2)

## 2021-11-02 MED ORDER — CEFAZOLIN SODIUM-DEXTROSE 2-4 GM/100ML-% IV SOLN
2.0000 g | Freq: Three times a day (TID) | INTRAVENOUS | Status: DC
  Administered 2021-11-02 – 2021-11-05 (×10): 2 g via INTRAVENOUS
  Filled 2021-11-02 (×11): qty 100

## 2021-11-02 NOTE — Progress Notes (Signed)
[facility]  Date of Admission:  10/23/2021           Reason for visit: Follow up on Strep, Ecoli bactremia, endocarditis  Current antibiotics: Unasyn Vancomycin  ASSESSMENT:    81 y.o. male admitted with:  Streptococcus infantarius and E. coli bacteremia with mitral valve endocarditis: This is secondary to cholangitis status post ERCP 10/23/21 with gastroenterology and removal of gallstones.  Repeat blood cultures have been negative since 10/24/2021.  He has had stable improvement in his LFTs.  They are improved today compared to yesterday.  He underwent transesophageal echocardiogram on 10/28/2021 which confirmed the presence of a small mitral valve vegetation.  PICC line was placed 2/18 for long-term antibiotics. New fever and rigors: Noted on 2/20 with a temperature of 102 F.  Has been afebrile since that time and blood cultures are no growth at over 48 hours. Acute kidney injury: Creatinine has continued to steadily improve.  Creatinine today is 1.11 with a creatinine clearance of 72.1. Elevated LFTs: Improved as noted above.  RECOMMENDATIONS:    Continue antibiotics.  Will change from Unasyn to cefazolin in anticipation of OPAT orders.  He has received 10 days of anaerobic coverage since his ERCP and no anaerobes were isolated in cultures. Stop vancomycin with negative blood cultures Lab monitoring Will follow   Principal Problem:   Bacteremia due to Streptococcus Active Problems:   Coronary artery disease   Atrial fibrillation (HCC)   Sepsis (HCC)   Calculus of bile duct with cholangitis and obstruction   Jaundice   Elevated LFTs   Hyperbilirubinemia   Bacteremia due to Escherichia coli   Palliative care by specialist    MEDICATIONS:    Scheduled Meds:  apixaban  5 mg Oral BID   chlorhexidine  15 mL Mouth Rinse BID   Chlorhexidine Gluconate Cloth  6 each Topical Q0600   dapagliflozin propanediol  5 mg Oral Daily   fenofibrate   160 mg Oral Daily   insulin aspart  0-15 Units Subcutaneous TID WC   lactulose  30 g Oral Daily   mouth rinse  15 mL Mouth Rinse q12n4p   metoprolol succinate  50 mg Oral Daily   potassium chloride  20 mEq Oral Daily   rosuvastatin  10 mg Oral Daily   sacubitril-valsartan  1 tablet Oral BID   sodium chloride flush  10-40 mL Intracatheter Q12H   tamsulosin  0.4 mg Oral Daily   Continuous Infusions:   ceFAZolin (ANCEF) IV     PRN Meds:.docusate sodium, hydrALAZINE, melatonin, ondansetron (ZOFRAN) IV, polyethylene glycol  SUBJECTIVE:   24 hour events:  Afebrile, Tmax 99.2 WBC improved LFTs improved Creatinine stable Reviewed blood cultures no growth  No new complaints this morning other than a lot of diarrhea due to lactulose.  No fevers or chills.  Hoping to work with physical therapy a little bit more to determine appropriate disposition.  Review of Systems  All other systems reviewed and are negative.    OBJECTIVE:   Blood pressure 134/60, pulse 74, temperature 98.8 F (37.1 C), temperature source Oral, resp. rate (!) 25, height 5\' 11"  (1.803 m), weight 123.3 kg, SpO2 95 %. Body mass index is 37.91 kg/m.  Physical Exam Constitutional:      General: He is not in acute distress.    Appearance: Normal appearance.  HENT:     Head: Normocephalic and atraumatic.  Eyes:     Extraocular Movements: Extraocular movements intact.  Conjunctiva/sclera: Conjunctivae normal.  Pulmonary:     Effort: Pulmonary effort is normal. No respiratory distress.  Abdominal:     General: There is no distension.  Musculoskeletal:        General: Normal range of motion.     Comments: Left upper extremity PICC line appears in appropriate position.  No obvious erythema, swelling or drainage from the site.  Neurological:     General: No focal deficit present.     Mental Status: He is alert and oriented to person, place, and time.  Psychiatric:        Mood and Affect: Mood normal.         Behavior: Behavior normal.     Lab Results: Lab Results  Component Value Date   WBC 11.8 (H) 11/02/2021   HGB 8.4 (L) 11/02/2021   HCT 27.1 (L) 11/02/2021   MCV 90.3 11/02/2021   PLT 270 11/02/2021    Lab Results  Component Value Date   NA 136 11/02/2021   K 3.9 11/02/2021   CO2 22 11/02/2021   GLUCOSE 123 (H) 11/02/2021   BUN 15 11/02/2021   CREATININE 1.11 11/02/2021   CALCIUM 7.6 (L) 11/02/2021   GFRNONAA >60 11/02/2021   GFRAA 55 (L) 09/29/2020    Lab Results  Component Value Date   ALT 40 11/02/2021   AST 43 (H) 11/02/2021   ALKPHOS 74 11/02/2021   BILITOT 1.2 11/02/2021       Component Value Date/Time   CRP 15.7 (H) 11/01/2021 0056    No results found for: ESRSEDRATE   I have reviewed the micro and lab results in Epic.  Imaging: No results found.   Imaging independently reviewed in Epic.    [facility]ious [facility] [phone] pager 11/02/2021, 12:21 PM

## 2021-11-02 NOTE — Plan of Care (Signed)
  Problem: Education: Goal: Knowledge of General Education information will improve Description Including pain rating scale, medication(s)/side effects and non-pharmacologic comfort measures Outcome: Progressing   Problem: Health Behavior/Discharge Planning: Goal: Ability to manage health-related needs will improve Outcome: Progressing   

## 2021-11-02 NOTE — Progress Notes (Signed)
Physical Therapy Treatment Patient Details Name: [name] Young. MRN: [id number] DOB: Mar 01, 1941 Today's Date: 11/02/2021   History of Present Illness 81 y.o. male presented to the ED 10/23/21 with complaints of abdominal pain with associated nausea and vomiting that began the day before. In ED pt found to be in A-fib RVR, hypertensive, and tachypnea. CTA chest/abd/pelvis reveled dilated bile duct, acute cholelithiasis without cholecystis found to have cholangitis status post ERCP with gastroenterology on 10/23/2021.  His blood cultures on admission are positive for E. coli and Streptococcus species.  PMH significant for CAD s/p CAGB 2019 A-fib,, HTN. HLD, diabetes, GERD, and CKD.    PT Comments    Pt received in supine, agreeable to limited therapy session at bed-level, pt with slow processing and some word finding difficulty, RN/MD notified as this therapist unsure of his baseline. Pt able to express discomfort but unable to localize or explain symptoms other than "my L leg doesn't feel right". Pt refusing to eat dinner tray, to transfer to chair or edge of bed and minimally participatory. Emphasis on pressure relief strategies, UE/LE therapeutic exercises for strengthening, positioning for edema reduction and benefits of mobility.  Plan to assess seated balance next session, may try earlier in day when pt family present. Pt continues to benefit from PT services to progress toward functional mobility goals.  Recommendations for follow up therapy are one component of a multi-disciplinary discharge planning process, led by the attending physician.  Recommendations may be updated based on patient status, additional functional criteria and insurance authorization.  Follow Up Recommendations  Skilled nursing-short term rehab (<3 hours/day)     Assistance Recommended at Discharge Frequent or constant Supervision/Assistance  Patient can return home with the following Two people to help with walking  and/or transfers;Help with stairs or ramp for entrance;Assist for transportation   Equipment Recommendations  Wheelchair (measurements PT)    Recommendations for Other Services       Precautions / Restrictions Precautions Precautions: Fall Precaution Comments: word finding difficulty, possible mild delirium? Restrictions Weight Bearing Restrictions: No     Mobility  Bed Mobility Overal bed mobility: Needs Assistance Bed Mobility: Rolling Rolling: Mod assist         General bed mobility comments: pt needing +2 maxA with bed in trendelenburg to reposition toward HOB; his LUE was too painful and swollen to assist with pulling up    Transfers                   General transfer comment: pt refusing EOB/OOB mobility to chair despite max encouragement    Ambulation/Gait                   Stairs             Wheelchair Mobility    Modified Rankin (Stroke Patients Only)       Balance Overall balance assessment: Needs assistance     Sitting balance - Comments: pt defers                                    Cognition Arousal/Alertness: Awake/alert Behavior During Therapy: WFL for tasks assessed/performed (depressed affect) Overall Cognitive Status: Impaired/Different from baseline Area of Impairment: Problem solving, Following commands, Awareness, Safety/judgement                       Following Commands: Follows one step commands consistently,  Follows one step commands with increased time Safety/Judgement: Decreased awareness of deficits Awareness: Emergent Problem Solving: Slow processing, Decreased initiation, Requires verbal cues, Difficulty sequencing, Requires tactile cues General Comments: slow processing, some word finding issues (pt with possible LE discomfort but unable to explain quality or localize area even when given multiple choice style options given word finding issues); RN/MD notified, pt also refusing  his dinner or to eat more than a few bites of jello from tray. Curtains raised to allow more light into room and encourage natural sleep/wake cycles.        Exercises Low Level/ICU Exercises Ankle Circles/Pumps: AROM, Both, 10 reps, Supine Hip ABduction/ADduction: AAROM, Both, 5 reps, Supine Heel Slides: AAROM, Both, 5 reps, Supine Breathing Exercises: 5 reps, Supine Shoulder Flexion: AROM, Both, 5 reps, Supine    General Comments General comments (skin integrity, edema, etc.): LUE edema and tender to touch, elevated this extremity but RN notified of pt discomfort; heels floated for comfort and pillow under L hip to offload pressure; VSS on RA      Pertinent Vitals/Pain Pain Assessment Pain Assessment: PAINAD Breathing: occasional labored breathing, short period of hyperventilation Negative Vocalization: occasional moan/groan, low speech, negative/disapproving quality Facial Expression: sad, frightened, frown Body Language: relaxed Consolability: distracted or reassured by voice/touch PAINAD Score: 4 Pain Location: pt unable to localize, just c/o leg "issues"; heels floated for comfort Pain Descriptors / Indicators: Discomfort, Grimacing Pain Intervention(s): Limited activity within patient's tolerance, Monitored during session, Repositioned    Home Living                          Prior Function            PT Goals (current goals can now be found in the care plan section) Acute Rehab PT Goals Patient Stated Goal: less pain PT Goal Formulation: With patient Time For Goal Achievement: 11/09/21 Progress towards PT goals: Progressing toward goals (self-limiting, pain limiting)    Frequency    Min 3X/week      PT Plan Current plan remains appropriate    Co-evaluation              AM-PAC PT "6 Clicks" Mobility   Outcome Measure  Help needed turning from your back to your side while in a flat bed without using bedrails?: A Lot Help needed moving  from lying on your back to sitting on the side of a flat bed without using bedrails?: Total Help needed moving to and from a bed to a chair (including a wheelchair)?: Total Help needed standing up from a chair using your arms (e.g., wheelchair or bedside chair)?: Total Help needed to walk in hospital room?: Total Help needed climbing 3-5 steps with a railing? : Total 6 Click Score: 7    End of Session   Activity Tolerance: Patient limited by pain;Patient limited by fatigue Patient left: in bed;with call bell/phone within reach;with bed alarm set;Other (comment) (HOB elevated in case he will eat/drink more from tray, heels floated, pillow under L hip to relieve pressure) Nurse Communication: Mobility status;Need for lift equipment;Other (comment) (some word finding difficulty, LUE edema/tenderness, pt depressed affect and refusing dinner) PT Visit Diagnosis: Muscle weakness (generalized) (M62.81);Difficulty in walking, not elsewhere classified (R26.2)     Time: 5400-8676 PT Time Calculation (min) (ACUTE ONLY): 18 min  Charges:  $Therapeutic Activity: 8-22 mins  Houston Siren., PTA Acute Rehabilitation Services Pager: [phone] Office: [facility] 11/02/2021, 6:39 PM

## 2021-11-02 NOTE — Progress Notes (Signed)
Pt noted with episode of ventricular trigeminy and 6 run beat of multifocal PVCs. Pt asymptomatic upon assessment. Dr. Alcario Drought made aware, no new orders noted.

## 2021-11-02 NOTE — Progress Notes (Signed)
PROGRESS NOTE                                                                                                                                                                                                             Patient Demographics:    [name] Young, is a 81 y.o. male, DOB - 1940/09/13, VVO:[id number]  Outpatient Primary MD for the patient is Ria Bush, MD    LOS - 86  Admit date - 10/23/2021    Chief Complaint  Patient presents with   Abdominal Pain       Brief Narrative (HPI from H&P)      81 year old gentleman with a history of coronary artery disease status post CABG 2019, paroxysmal atrial fibrillation, diabetes, GERD, CKD who presented with abdominal pain, found to have ascending cholangitis and bacteremia, he was kept in ICU was seen by GI underwent ERCP with gallstone extraction, he was also seen by ID for bacteremia.  He was transferred to my service on 10/28/2021 on day 5 of his hospital admission   Subjective:   No significant events overnight as discussed with staff, patient denies any fever, chills, headache, he is afebrile for last 48 hours.       Assessment  & Plan :   Sepsis and bacteremia with Streptococcus infantarius and E. Coli and now confirmed mitral valve endocarditis  - caused by ascending cholangitis, had gallstones was seen by GI and he underwent ERCP with 2 gallbladder stones removed with sphincterotomy on 10/23/2021, seen by ID currently on IV antibiotics, TTE was stable unfortunately TEE done on 10/28/2021 does confirm mitral valve endocarditis, case discussed with ID PICC line placed on 10/28/2021 for prolonged IV antibiotics.  Thankfully sepsis pathophysiology has resolved and CRP and procalcitonin are trending down. -PICC line placed 2/18 -Antibiotics management per ID, on IV Unasyn, on IV vancomycin for last 2 days, currently afebrile, currently changed to IV cefazolin.  IV  vancomycin discontinued given negative cultures.    AKI on CKD 3B.   - Seen by nephrology, AKI due to ATN from sepsis.  Improved after hydration and hemodynamic stability.  Creatinine peaked at 4.5 now down to 1.5.  Elevated LFT - due to above , trending down coming down  Acute systolic CHF  - drop in systolic function of heart with EF dropping to 45% down from  55% few years ago.   - Likely due to sepsis.  Currently on beta-blocker, seen by cardiology.  Avoiding ACE/ARB due to renal failure.  Compensated from this fluid standpoint. Cards on board. -Started on Farxiga  Paroxysmal atrial fibrillation with [place] vas 2 score of greater than 3.   - On combination of Lopressor and full dose Lovenox which we will hold on 10/30/2021 due to PICC line site hematoma, resumed Eliquis on 10/31/2021.  Severe of weakness and deconditioning.  PT OT and monitor.  May require SNF.  Hypokalemia.  Replaced.  BPH.  On Flomax continue.  DM type II.  For now sliding scale.  A1c satisfactory at 6.1  CBG (last 3)  Recent Labs    11/01/21 2055 11/02/21 0821 11/02/21 1202  GLUCAP 118* 168* 141*         Condition - Extremely Guarded  Family Communication  :  None at bedside  Code Status :  Full  Consults  :  PCCM, ID, Cards, Pall. Care  PUD Prophylaxis :    Procedures  :     CT Head - non acute  ERCP with removal of 2 gallbladder stones and sphincterotomy on 10/23/2021.    CT Abd & Pelvis - Chest -  1. No evidence of acute aortic syndrome. 2. No acute findings in the thorax to account for the patient's symptoms. 3. 5 mm calculus at the right ureteropelvic junction. At this time, there is no proximal hydronephrosis to indicate urinary tract obstruction. 4. Cholelithiasis without evidence of acute cholecystitis. 5. Dilatation of the common bile duct. No intrahepatic biliary ductal dilatation to clearly indicate biliary tract obstruction. Additionally, there is no calcified  choledocholithiasis. This is of uncertain etiology and significance, and could be age related, however, correlation with liver function tests is recommended. If there is clinical concern for biliary tract obstruction, further evaluation with abdominal MRI with and without IV gadolinium with MRCP would be recommended. 6. Aortic atherosclerosis, in addition to left main and three-vessel coronary artery disease. Status post median sternotomy for CABG including LIMA to the LAD. 7. There are calcifications of the aortic valve. Echocardiographic correlation for evaluation of potential valvular dysfunction may be warranted if clinically indicated. 8. Additional incidental findings, as above.  TTE -  EF 45% with global hypokinesis.  TEE -   Suspected endocarditis of the mitral valve with increased echogenicty, thickening and hockystick motion as well as a small mobile vegetation. There is mild MR. Findings are new compared to TEE in 2019 which was personally reviewed. Dilated ascending aorta to 38 mm. Thickening of the aortic valve leaflets without mobile vegetation. LVEF 40-45%, global hypokinesis. Pre-op EKG demonstrated the patient was back in sinus rhythm with PVC's, therefore, cardioversion was not pursued.      Disposition Plan  :    Status is: Inpatient  DVT Prophylaxis  :  on Eliquis  SCDs Start: 10/23/21 0909 apixaban (ELIQUIS) tablet 5 mg  Lab Results  Component Value Date   PLT 270 11/02/2021    Diet :  Diet Order             Diet regular Room service appropriate? Yes; Fluid consistency: Thin  Diet effective now                    Inpatient Medications  Scheduled Meds:  apixaban  5 mg Oral BID   chlorhexidine  15 mL Mouth Rinse BID   Chlorhexidine Gluconate Cloth  6 each Topical Q0600  dapagliflozin propanediol  5 mg Oral Daily   fenofibrate  160 mg Oral Daily   insulin aspart  0-15 Units Subcutaneous TID WC   lactulose  30 g Oral Daily   mouth rinse  15 mL  Mouth Rinse q12n4p   metoprolol succinate  50 mg Oral Daily   potassium chloride  20 mEq Oral Daily   rosuvastatin  10 mg Oral Daily   sacubitril-valsartan  1 tablet Oral BID   sodium chloride flush  10-40 mL Intracatheter Q12H   tamsulosin  0.4 mg Oral Daily   Continuous Infusions:   ceFAZolin (ANCEF) IV     PRN Meds:.docusate sodium, hydrALAZINE, melatonin, ondansetron (ZOFRAN) IV, polyethylene glycol     Phillips Climes M.D on 11/02/2021 at 1:19 PM  To page go to [email or web address]   [facility] -  Office  [phone]  See all Orders from today for further details    Objective:   Vitals:   11/02/21 0350 11/02/21 0500 11/02/21 0819 11/02/21 1200  BP: 137/60  (!) 137/59 134/60  Pulse: 76  76 74  Resp: (!) 22  (!) 24 (!) 25  Temp: 97.9 F (36.6 C)  (!) 97.3 F (36.3 C) 98.8 F (37.1 C)  TempSrc: Oral  Oral Oral  SpO2: 96%  94% 95%  Weight:  123.3 kg    Height:        Wt Readings from Last 3 Encounters:  11/02/21 123.3 kg  06/07/21 118 kg  01/28/21 118.8 kg     Intake/Output Summary (Last 24 hours) at 11/02/2021 1319 Last data filed at 11/02/2021 0981 Gross per 24 hour  Intake 360 ml  Output 625 ml  Net -265 ml     Physical Exam  Awake Alert, Oriented X 3, slow to answer but is appropriate, follows commands appropriately. Symmetrical Chest wall movement, Good air movement bilaterally, CTAB RRR,No Gallops,Rubs or new Murmurs, No Parasternal Heave +ve B.Sounds, Abd Soft, No tenderness, No rebound - guarding or rigidity.   Trace edema. Left arm PICC line with a large bruise around the site    Data Review:    CBC Recent Labs  Lab 10/29/21 0209 10/30/21 0127 10/31/21 0145 11/01/21 0056 11/02/21 0252  WBC 13.7* 15.3* 14.4* 15.5* 11.8*  HGB 10.1* 10.0* 9.3* 8.9* 8.4*  HCT 31.0* 30.7* 28.5* 28.6* 27.1*  PLT 124* 148* 184 218 270  MCV 86.4 88.0 88.5 90.8 90.3  MCH 28.1 28.7 28.9 28.3 28.0  MCHC 32.6 32.6 32.6 31.1 31.0  RDW 16.5* 16.9* 17.1*  17.2* 17.2*  LYMPHSABS 1.6 1.9 1.8 1.5  --   MONOABS 1.1* 0.9 0.8 0.7  --   EOSABS 0.2 0.2 0.2 0.2  --   BASOSABS 0.1 0.0 0.0 0.0  --     Electrolytes Recent Labs  Lab 10/28/21 0317 10/29/21 0209 10/30/21 0127 10/31/21 0145 11/01/21 0056 11/02/21 0252  NA 142 141 138 138 138 136  K 3.2* 3.5 3.6 3.6 3.6 3.9  CL 108 107 105 105 106 105  CO2 24 24 23 25 25 22   GLUCOSE 157* 174* 155* 161* 132* 123*  BUN 49* 35* 25* 22 20 15   CREATININE 1.51* 1.14 1.08 1.11 1.18 1.11  CALCIUM 7.9* 7.8* 7.6* 7.7* 7.7* 7.6*  AST  --  32 27 24 64* 43*  ALT  --  104* 63* 46* 51* 40  ALKPHOS  --  97 85 79 77 74  BILITOT  --  1.8* 2.0* 1.3* 1.2 1.2  ALBUMIN  --  2.2* 2.0* 1.9* 1.9* 1.8*  MG  --  1.9 1.7 2.0 2.0  --   CRP 25.4* 16.7* 14.2* 14.5* 15.7*  --   PROCALCITON 13.32 5.90 3.48 2.26 3.17 1.69  BNP  --  727.2* 394.5*  --   --   --     ------------------------------------------------------------------------------------------------------------------ No results for input(s): CHOL, HDL, LDLCALC, TRIG, CHOLHDL, LDLDIRECT in the last 72 hours.  Lab Results  Component Value Date   HGBA1C 6.1 (H) 10/23/2021    No results for input(s): TSH, T4TOTAL, T3FREE, THYROIDAB in the last 72 hours.  Invalid input(s): FREET3 ------------------------------------------------------------------------------------------------------------------ ID Labs Recent Labs  Lab 10/28/21 0317 10/29/21 0209 10/30/21 0127 10/31/21 0145 11/01/21 0056 11/02/21 0252  WBC 11.0* 13.7* 15.3* 14.4* 15.5* 11.8*  PLT 92* 124* 148* 184 218 270  CRP 25.4* 16.7* 14.2* 14.5* 15.7*  --   PROCALCITON 13.32 5.90 3.48 2.26 3.17 1.69  CREATININE 1.51* 1.14 1.08 1.11 1.18 1.11   Cardiac Enzymes No results for input(s): CKMB, TROPONINI, MYOGLOBIN in the last 168 hours.  Invalid input(s): CK   Radiology Reports DG Chest Port 1 View  Result Date: 10/31/2021 CLINICAL DATA:  Shortness of breath in a 81 year old male. EXAM:  PORTABLE CHEST 1 VIEW COMPARISON:  Comparison with October 28, 2021. FINDINGS: EKG leads project over the chest. Study limited by patient condition by report. Median sternotomy changes and changes of coronary revascularization. Heart size is stable. No lobar consolidative process. Minimal LEFT basilar airspace disease with slightly improved aeration in the chest compared to previous imaging. No visible pneumothorax. On limited assessment there is no acute skeletal finding. IMPRESSION: Minimal LEFT basilar airspace disease with slightly improved aeration in the chest compared to previous imaging. No other interval change. Electronically Signed   By: Zetta Bills M.D.   On: 10/31/2021 11:52

## 2021-11-03 ENCOUNTER — Other Ambulatory Visit (HOSPITAL_COMMUNITY): Payer: Self-pay

## 2021-11-03 DIAGNOSIS — K8033 Calculus of bile duct with acute cholangitis with obstruction: Secondary | ICD-10-CM | POA: Diagnosis not present

## 2021-11-03 DIAGNOSIS — I059 Rheumatic mitral valve disease, unspecified: Secondary | ICD-10-CM

## 2021-11-03 DIAGNOSIS — R7989 Other specified abnormal findings of blood chemistry: Secondary | ICD-10-CM | POA: Diagnosis not present

## 2021-11-03 DIAGNOSIS — N179 Acute kidney failure, unspecified: Secondary | ICD-10-CM | POA: Diagnosis not present

## 2021-11-03 DIAGNOSIS — R7881 Bacteremia: Secondary | ICD-10-CM | POA: Diagnosis not present

## 2021-11-03 LAB — CBC
HCT: 26.9 % — ABNORMAL LOW (ref 39.0–52.0)
Hemoglobin: 8.4 g/dL — ABNORMAL LOW (ref 13.0–17.0)
MCH: 28.3 pg (ref 26.0–34.0)
MCHC: 31.2 g/dL (ref 30.0–36.0)
MCV: 90.6 fL (ref 80.0–100.0)
Platelets: 316 10*3/uL (ref 150–400)
RBC: 2.97 MIL/uL — ABNORMAL LOW (ref 4.22–5.81)
RDW: 17.1 % — ABNORMAL HIGH (ref 11.5–15.5)
WBC: 10.2 10*3/uL (ref 4.0–10.5)
nRBC: 0 % (ref 0.0–0.2)

## 2021-11-03 LAB — GLUCOSE, CAPILLARY
Glucose-Capillary: 144 mg/dL — ABNORMAL HIGH (ref 70–99)
Glucose-Capillary: 180 mg/dL — ABNORMAL HIGH (ref 70–99)
Glucose-Capillary: 110 mg/dL — ABNORMAL HIGH (ref 70–99)
Glucose-Capillary: 144 mg/dL — ABNORMAL HIGH (ref 70–99)

## 2021-11-03 NOTE — TOC Benefit Eligibility Note (Signed)
Patient Teacher, English as a foreign language completed.    The patient is currently admitted and upon discharge could be taking Entresto 24-26 mg.  The current 30 day co-pay is, $40.00.   The patient is currently admitted and upon discharge could be taking Farxiga 5 mg.  The current 30 day co-pay is, $64.00.  The patient is insured through [place], [place] Patient Advocate Specialist [place] Patient Advocate Team Direct Number: [phone]  Fax: [phone]

## 2021-11-03 NOTE — Progress Notes (Signed)
\                                                                   PROGRESS NOTE                                                                                                                                                                                                             Patient Demographics:    [name] Young, is a 81 y.o. male, DOB - [birth year], MPN:[id number]  Outpatient Primary MD for the patient is Ria Bush, MD    LOS - 35  Admit date - 10/23/2021    Chief Complaint  Patient presents with   Abdominal Pain       Brief Narrative (HPI from H&P)       81 year old gentleman with a history of coronary artery disease status post CABG 2019, paroxysmal atrial fibrillation, diabetes, GERD, CKD who presented with abdominal pain, found to have ascending cholangitis and bacteremia, he was kept in ICU was seen by GI underwent ERCP with gallstone extraction, he was also seen by ID for bacteremia.  As well work-up significant for endocarditis.   Subjective:   No significant events overnight as discussed with staff, patient reports diarrhea and asking for his lactulose to be stopped.      Assessment  & Plan :   Sepsis and bacteremia with Streptococcus infantarius and E. Coli and now confirmed mitral valve endocarditis  - caused by ascending cholangitis, had gallstones was seen by GI and he underwent ERCP with 2 gallbladder stones removed with sphincterotomy on 10/23/2021, seen by ID currently on IV antibiotics, TTE was stable unfortunately TEE done on 10/28/2021 does confirm mitral valve endocarditis, case discussed with ID PICC line placed on 10/28/2021 for prolonged IV antibiotics.  Thankfully sepsis pathophysiology has resolved and CRP and procalcitonin are trending down. -PICC line placed 2/18 -Antibiotics management per ID, on IV Unasyn, on IV vancomycin for last 2 days, currently afebrile, currently changed to IV cefazolin.  IV vancomycin discontinued given negative  cultures. -Recommendation for IV cefazolin for total of 6 weeks with end date /12/05/2021.   AKI on CKD 3B.   - Seen by nephrology, AKI due to ATN from sepsis.  Improved after hydration and hemodynamic stability.  Creatinine peaked at 4.5 now  down to 1.5.  Elevated LFT - due to above , trending down  Acute systolic CHF  - drop in systolic function of heart with EF dropping to 45% down from 55% few years ago.   - Likely due to sepsis.  Currently on beta-blocker, seen by cardiology.  Avoiding ACE/ARB due to renal failure.  Compensated from this fluid standpoint. Cards on board. -Started on Farxiga  Paroxysmal atrial fibrillation with [place] vas 2 score of greater than 3.   - On combination of Lopressor and full dose Lovenox which we will hold on 10/30/2021 due to PICC line site hematoma, resumed Eliquis on 10/31/2021.  Severe of weakness and deconditioning.  PT OT and monitor.  May require SNF.  Hypokalemia.  Replaced.  BPH.  On Flomax continue.  DM type II.  For now sliding scale.  A1c satisfactory at 6.1  CBG (last 3)  Recent Labs    11/02/21 2016 11/03/21 0750 11/03/21 1332  GLUCAP 144* 144* 180*         Condition - Extremely Guarded  Family Communication  :  D/W wife at bedside  Code Status :  Full  Consults  :  PCCM, ID, Cards, Pall. Care  PUD Prophylaxis :    Procedures  :     CT Head - non acute  ERCP with removal of 2 gallbladder stones and sphincterotomy on 10/23/2021.    CT Abd & Pelvis - Chest -  1. No evidence of acute aortic syndrome. 2. No acute findings in the thorax to account for the patient's symptoms. 3. 5 mm calculus at the right ureteropelvic junction. At this time, there is no proximal hydronephrosis to indicate urinary tract obstruction. 4. Cholelithiasis without evidence of acute cholecystitis. 5. Dilatation of the common bile duct. No intrahepatic biliary ductal dilatation to clearly indicate biliary tract obstruction. Additionally, there  is no calcified choledocholithiasis. This is of uncertain etiology and significance, and could be age related, however, correlation with liver function tests is recommended. If there is clinical concern for biliary tract obstruction, further evaluation with abdominal MRI with and without IV gadolinium with MRCP would be recommended. 6. Aortic atherosclerosis, in addition to left main and three-vessel coronary artery disease. Status post median sternotomy for CABG including LIMA to the LAD. 7. There are calcifications of the aortic valve. Echocardiographic correlation for evaluation of potential valvular dysfunction may be warranted if clinically indicated. 8. Additional incidental findings, as above.  TTE -  EF 45% with global hypokinesis.  TEE -   Suspected endocarditis of the mitral valve with increased echogenicty, thickening and hockystick motion as well as a small mobile vegetation. There is mild MR. Findings are new compared to TEE in 2019 which was personally reviewed. Dilated ascending aorta to 38 mm. Thickening of the aortic valve leaflets without mobile vegetation. LVEF 40-45%, global hypokinesis. Pre-op EKG demonstrated the patient was back in sinus rhythm with PVC's, therefore, cardioversion was not pursued.      Disposition Plan  :    Status is: Inpatient  DVT Prophylaxis  :  on Eliquis  SCDs Start: 10/23/21 0909 apixaban (ELIQUIS) tablet 5 mg  Lab Results  Component Value Date   PLT 316 11/03/2021    Diet :  Diet Order             Diet regular Room service appropriate? Yes; Fluid consistency: Thin  Diet effective now  Inpatient Medications  Scheduled Meds:  apixaban  5 mg Oral BID   Chlorhexidine Gluconate Cloth  6 each Topical Q0600   dapagliflozin propanediol  5 mg Oral Daily   fenofibrate  160 mg Oral Daily   insulin aspart  0-15 Units Subcutaneous TID WC   metoprolol succinate  50 mg Oral Daily   potassium chloride  20 mEq Oral  Daily   rosuvastatin  10 mg Oral Daily   sacubitril-valsartan  1 tablet Oral BID   sodium chloride flush  10-40 mL Intracatheter Q12H   tamsulosin  0.4 mg Oral Daily   Continuous Infusions:   ceFAZolin (ANCEF) IV 2 g (11/03/21 1348)   PRN Meds:.docusate sodium, hydrALAZINE, melatonin, ondansetron (ZOFRAN) IV, polyethylene glycol     Phillips Climes M.D on 11/03/2021 at 2:46 PM  To page go to [email or web address]   [facility] -  Office  [phone]  See all Orders from today for further details    Objective:   Vitals:   11/02/21 2316 11/03/21 0356 11/03/21 0749 11/03/21 0800  BP: (!) 150/66 140/63 133/62 (!) 136/59  Pulse: 76 72 71 74  Resp: (!) 23 (!) 24 (!) 22   Temp: 98.8 F (37.1 C) 98.7 F (37.1 C) 98.3 F (36.8 C)   TempSrc: Oral Oral Oral   SpO2: 95% 95% 93%   Weight:  126.6 kg    Height:        Wt Readings from Last 3 Encounters:  11/03/21 126.6 kg  06/07/21 118 kg  01/28/21 118.8 kg     Intake/Output Summary (Last 24 hours) at 11/03/2021 1446 Last data filed at 11/03/2021 7741 Gross per 24 hour  Intake 210 ml  Output 2675 ml  Net -2465 ml     Physical Exam  Awake Alert, Oriented X 3, answering questions appropriately, but slow to respond  Symmetrical Chest wall movement, Good air movement bilaterally, CTAB RRR,No Gallops,Rubs or new Murmurs, No Parasternal Heave +ve B.Sounds, Abd Soft, No tenderness, No rebound - guarding or rigidity. Trace edema, right upper extremity edema     Data Review:    CBC Recent Labs  Lab 10/29/21 0209 10/30/21 0127 10/31/21 0145 11/01/21 0056 11/02/21 0252 11/03/21 0404  WBC 13.7* 15.3* 14.4* 15.5* 11.8* 10.2  HGB 10.1* 10.0* 9.3* 8.9* 8.4* 8.4*  HCT 31.0* 30.7* 28.5* 28.6* 27.1* 26.9*  PLT 124* 148* 184 218 270 316  MCV 86.4 88.0 88.5 90.8 90.3 90.6  MCH 28.1 28.7 28.9 28.3 28.0 28.3  MCHC 32.6 32.6 32.6 31.1 31.0 31.2  RDW 16.5* 16.9* 17.1* 17.2* 17.2* 17.1*  LYMPHSABS 1.6 1.9 1.8 1.5  --   --    MONOABS 1.1* 0.9 0.8 0.7  --   --   EOSABS 0.2 0.2 0.2 0.2  --   --   BASOSABS 0.1 0.0 0.0 0.0  --   --     Electrolytes Recent Labs  Lab 10/28/21 0317 10/29/21 0209 10/30/21 0127 10/31/21 0145 11/01/21 0056 11/02/21 0252  NA 142 141 138 138 138 136  K 3.2* 3.5 3.6 3.6 3.6 3.9  CL 108 107 105 105 106 105  CO2 24 24 23 25 25 22   GLUCOSE 157* 174* 155* 161* 132* 123*  BUN 49* 35* 25* 22 20 15   CREATININE 1.51* 1.14 1.08 1.11 1.18 1.11  CALCIUM 7.9* 7.8* 7.6* 7.7* 7.7* 7.6*  AST  --  32 27 24 64* 43*  ALT  --  104* 63* 46* 51* 40  ALKPHOS  --  97 85 79 77 74  BILITOT  --  1.8* 2.0* 1.3* 1.2 1.2  ALBUMIN  --  2.2* 2.0* 1.9* 1.9* 1.8*  MG  --  1.9 1.7 2.0 2.0  --   CRP 25.4* 16.7* 14.2* 14.5* 15.7*  --   PROCALCITON 13.32 5.90 3.48 2.26 3.17 1.69  BNP  --  727.2* 394.5*  --   --   --     ------------------------------------------------------------------------------------------------------------------ No results for input(s): CHOL, HDL, LDLCALC, TRIG, CHOLHDL, LDLDIRECT in the last 72 hours.  Lab Results  Component Value Date   HGBA1C 6.1 (H) 10/23/2021    No results for input(s): TSH, T4TOTAL, T3FREE, THYROIDAB in the last 72 hours.  Invalid input(s): FREET3 ------------------------------------------------------------------------------------------------------------------ ID Labs Recent Labs  Lab 10/28/21 0317 10/29/21 0209 10/30/21 0127 10/31/21 0145 11/01/21 0056 11/02/21 0252 11/03/21 0404  WBC 11.0* 13.7* 15.3* 14.4* 15.5* 11.8* 10.2  PLT 92* 124* 148* 184 218 270 316  CRP 25.4* 16.7* 14.2* 14.5* 15.7*  --   --   PROCALCITON 13.32 5.90 3.48 2.26 3.17 1.69  --   CREATININE 1.51* 1.14 1.08 1.11 1.18 1.11  --    Cardiac Enzymes No results for input(s): CKMB, TROPONINI, MYOGLOBIN in the last 168 hours.  Invalid input(s): CK   Radiology Reports DG Chest Port 1 View  Result Date: 10/31/2021 CLINICAL DATA:  Shortness of breath in a 81 year old male.  EXAM: PORTABLE CHEST 1 VIEW COMPARISON:  Comparison with October 28, 2021. FINDINGS: EKG leads project over the chest. Study limited by patient condition by report. Median sternotomy changes and changes of coronary revascularization. Heart size is stable. No lobar consolidative process. Minimal LEFT basilar airspace disease with slightly improved aeration in the chest compared to previous imaging. No visible pneumothorax. On limited assessment there is no acute skeletal finding. IMPRESSION: Minimal LEFT basilar airspace disease with slightly improved aeration in the chest compared to previous imaging. No other interval change. Electronically Signed   By: Zetta Bills M.D.   On: 10/31/2021 11:52

## 2021-11-03 NOTE — TOC Progression Note (Signed)
Transition of Care (TOC) - Progression Note    Patient Details  Name: [name] Young. MRN: [id number] Date of Birth: 1940-12-28  Transition of Care [facility]) CM/SW [place], LCSW Phone Number: 11/03/2021, 3:21 PM  Clinical Narrative:    CSW spoke with patient and spouse at bedside to confirm discharge plan for this weekend. They reported that they are requesting [facility]. Patient will require non-emergency transport.   CSW contacted admissions at [facility] and they have a bed available for patient and aware of PICC for IV Ancef q8. They request DC Summary on Friday to obtain medications (and do addendum as needed on Saturday). No Covid test needed.    CSW obtained insurance authorization approval through [employer]: Ref# [id number], effective 11/04/2021-11/08/2021.    Expected Discharge Plan: [place] Barriers to Discharge: Continued Medical Work up  Expected Discharge Plan and Services Expected Discharge Plan: [place] In-house Referral: Clinical Social Work   Post Acute Care Choice:  (TBD) Living arrangements for the past 2 months: Single Family Home                                       Social Determinants of Health (SDOH) Interventions    Readmission Risk Interventions No flowsheet data found.

## 2021-11-03 NOTE — Progress Notes (Signed)
Occupational Therapy Treatment Patient Details Name: Trevell Young. MRN: [id number] DOB: August 15, 1941 Today's Date: 11/03/2021   History of present illness 81 y.o. male presented to the ED 10/23/21 with complaints of abdominal pain with associated nausea and vomiting that began the day before. In ED pt found to be in A-fib RVR, hypertensive, and tachypnea. CTA chest/abd/pelvis reveled dilated bile duct, acute cholelithiasis without cholecystis found to have cholangitis status post ERCP with gastroenterology on 10/23/2021.  His blood cultures on admission are positive for E. coli and Streptococcus species.  PMH significant for CAD s/p CAGB 2019 A-fib,, HTN. HLD, diabetes, GERD, and CKD.   OT comments  Pt continues to progress towards established OT goals with improvements in bed mobility and sit to stand transfers with RW at [place]. Session focused on standing tolerance for ADLs at sink and endurance during short mobility. Pt able to stand without UE support to complete grooming tasks at sink > 5 min though notably SOB with standing tasks. Wife present, supportive and hands on to assist pt with LB ADLs. Both remain hopeful pt can progress home with limited assist. Educated re: tub bench use at home (already have this DME), raised toilet seat options (DME recs updated to reflect their preferences), and energy conservation with ADLs/mobility. VSS on RA.    Recommendations for follow up therapy are one component of a multi-disciplinary discharge planning process, led by the attending physician.  Recommendations may be updated based on patient status, additional functional criteria and insurance authorization.    Follow Up Recommendations  Skilled nursing-short term rehab (<3 hours/day) (hopeful to progress to home with [facility])    Assistance Recommended at Discharge Frequent or constant Supervision/Assistance  Patient can return home with the following  A lot of help with walking and/or transfers;A lot  of help with bathing/dressing/bathroom;Assistance with cooking/housework;Direct supervision/assist for medications management;Assist for transportation;Help with stairs or ramp for entrance   Equipment Recommendations  Toilet riser    Recommendations for Other Services      Precautions / Restrictions Precautions Precautions: Fall Precaution Comments: urine incontinence Restrictions Weight Bearing Restrictions: No       Mobility Bed Mobility Overal bed mobility: Needs Assistance Bed Mobility: Supine to Sit     Supine to sit: Min assist     General bed mobility comments: light min a to lift trunk with handheld assist    Transfers Overall transfer level: Needs assistance Equipment used: Rolling walker (2 wheels) Transfers: Sit to/from Stand Sit to Stand: Min assist           General transfer comment: Min A to stand from slightly elevated bed and from recliner, cues for hand placement. increased effort from lower recliner but same assist level     Balance Overall balance assessment: Needs assistance Sitting-balance support: No upper extremity supported, Feet supported Sitting balance-Leahy Scale: Good     Standing balance support: Bilateral upper extremity supported, During functional activity Standing balance-Leahy Scale: Fair Standing balance comment: able to stand unsupported at sink for ADLs, BUE for mobility                           ADL either performed or assessed with clinical judgement   ADL Overall ADL's : Needs assistance/impaired     Grooming: Min guard;Standing;Oral care;Wash/dry face Grooming Details (indicate cue type and reason): able to stand without UE support to place toothpaste on toothbrush without LOB. mild sway. shaved face seated  Lower Body Bathing: Moderate assistance;Sit to/from stand Lower Body Bathing Details (indicate cue type and reason): able to assist with lower LE bathing, wife assisting with peri care      Lower Body Dressing: Maximal assistance;Sit to/from stand Lower Body Dressing Details (indicate cue type and reason): to don socks. per wife, she often helps with task because pt wants her to             Functional mobility during ADLs: Minimal assistance;Rolling walker (2 wheels) General ADL Comments: improving functional abilities, continued difficulty with sit to stand transfers and endurance for standing/mobility with rest breaks needed after 5 min of activity    Extremity/Trunk Assessment Upper Extremity Assessment Upper Extremity Assessment: RUE deficits/detail;LUE deficits/detail LUE Deficits / Details: edema but improving   Lower Extremity Assessment Lower Extremity Assessment: Defer to PT evaluation        Vision   Vision Assessment?: No apparent visual deficits   Perception     Praxis      Cognition Arousal/Alertness: Awake/alert Behavior During Therapy: WFL for tasks assessed/performed Overall Cognitive Status: Impaired/Different from baseline Area of Impairment: Problem solving, Awareness, Safety/judgement                         Safety/Judgement: Decreased awareness of deficits Awareness: Emergent Problem Solving: Slow processing, Decreased initiation, Requires verbal cues, Difficulty sequencing, Requires tactile cues General Comments: slower processing but consistently follows commands. Benefits from encouragement to participate and cues for sequencing        Exercises      Shoulder Instructions       General Comments      Pertinent Vitals/ Pain       Pain Assessment Pain Assessment: No/denies pain Pain Intervention(s): Monitored during session  Home Living                                          Prior Functioning/Environment              Frequency  Min 2X/week        Progress Toward Goals  OT Goals(current goals can now be found in the care plan section)  Progress towards OT goals: Progressing  toward goals  Acute Rehab OT Goals Patient Stated Goal: hopeful to go home OT Goal Formulation: With patient Time For Goal Achievement: 11/09/21 Potential to Achieve Goals: Good ADL Goals Pt Will Perform Lower Body Bathing: with mod assist;sit to/from stand Pt Will Transfer to Toilet: with mod assist;with +2 assist;stand pivot transfer;bedside commode Additional ADL Goal #1: Pt to complete  bed mobility at Mod A in prep for ADL transfers Additional ADL Goal #2: Pt to demonstrate ability to sit EOB > 5 min with no more than Min A to maintain balance during functional tasks  Plan Discharge plan remains appropriate    Co-evaluation                 AM-PAC OT "6 Clicks" Daily Activity     Outcome Measure   Help from another person eating meals?: A Little Help from another person taking care of personal grooming?: A Little Help from another person toileting, which includes using toliet, bedpan, or urinal?: A Lot Help from another person bathing (including washing, rinsing, drying)?: A Lot Help from another person to put on and taking off regular upper body clothing?: A Little Help from  another person to put on and taking off regular lower body clothing?: A Lot 6 Click Score: 15    End of Session Equipment Utilized During Treatment: Gait belt;Rollator (4 wheels)  OT Visit Diagnosis: Unsteadiness on feet (R26.81);Other abnormalities of gait and mobility (R26.89);Muscle weakness (generalized) (M62.81)   Activity Tolerance Patient tolerated treatment well   Patient Left in chair;with call bell/phone within reach;with chair alarm set;with family/visitor present   Nurse Communication Mobility status        Time: 0221-7981 OT Time Calculation (min): 50 min  Charges: OT General Charges $OT Visit: 1 Visit OT Treatments $Self Care/Home Management : 23-37 mins $Therapeutic Activity: 8-22 mins  Malachy Chamber, OTR/L Acute Rehab Services Office: [phone]   Layla Maw 11/03/2021, 2:17 PM

## 2021-11-03 NOTE — Progress Notes (Signed)
[place] for Infectious Disease  Date of Admission:  10/23/2021           Reason for visit: Follow up on strep and E coli bacteremia, endocarditis  Current antibiotics: Cefazolin  ASSESSMENT:    81 y.o. male admitted with:  Streptococcus infantarius and E. coli bacteremia with mitral valve endocarditis: Secondary to cholangitis status post ERCP on 10/23/2021 with GI with removal of gallstones at that time.  Repeat blood cultures have been negative since 10/24/2021 with stable improvement in his LFTs.  TEE done on 2/17 confirmed the presence of a small mitral valve vegetation and PICC line was placed 2/18 for long-term antibiotics. Acute kidney injury: Creatinine has continued to steadily improve.  Creatinine yesterday was 1.11. Elevated LFTs: Also have continued to improve this hospitalization.  RECOMMENDATIONS:    Continue cefazolin OPAT orders placed yesterday Discharge planning in progress.  See OPAT note below and will sign off.  Please call with any questions.  Diagnosis: Mitral valve endocarditis  Culture Result: Strep and E. coli  No Known Allergies  OPAT Orders Discharge antibiotics to be given via PICC line Discharge antibiotics: Per pharmacy protocol cefazolin  Duration: 6 weeks End Date: 12/05/2021  [facility] Care Per Protocol:  Home health RN for IV administration and teaching; PICC line care and labs.    Labs weekly while on IV antibiotics: _xx_ CBC with differential _xx_ BMP __ CMP __ CRP __ ESR __ Vancomycin trough __ CK  _xx_ Please pull PIC at completion of IV antibiotics __ Please leave PIC in place until doctor has seen patient or been notified  Fax weekly labs to [phone]  Clinic Follow Up Appt: 11/17/21 @ 11:15am with Dr Juleen China     Principal Problem:   Bacteremia due to Streptococcus Active Problems:   Coronary artery disease   Atrial fibrillation (HCC)   Sepsis ([place])   Calculus of bile duct with cholangitis and  obstruction   Jaundice   Elevated LFTs   Hyperbilirubinemia   Bacteremia due to Escherichia coli   Palliative care by specialist    MEDICATIONS:    Scheduled Meds:  apixaban  5 mg Oral BID   Chlorhexidine Gluconate Cloth  6 each Topical Q0600   dapagliflozin propanediol  5 mg Oral Daily   fenofibrate  160 mg Oral Daily   insulin aspart  0-15 Units Subcutaneous TID WC   metoprolol succinate  50 mg Oral Daily   potassium chloride  20 mEq Oral Daily   rosuvastatin  10 mg Oral Daily   sacubitril-valsartan  1 tablet Oral BID   sodium chloride flush  10-40 mL Intracatheter Q12H   tamsulosin  0.4 mg Oral Daily   Continuous Infusions:   ceFAZolin (ANCEF) IV 2 g (11/03/21 0604)   PRN Meds:.docusate sodium, hydrALAZINE, melatonin, ondansetron (ZOFRAN) IV, polyethylene glycol  SUBJECTIVE:   24 hour events:  No acute events have been noted overnight Afebrile Tmax 99.2 WBC 10.2 10/31/2021 cultures remain negative   Patient reports some continued diarrhea with lactulose.  No fevers or abdominal pain.  Review of Systems  All other systems reviewed and are negative.    OBJECTIVE:   Blood pressure (!) 136/59, pulse 74, temperature 98.3 F (36.8 C), temperature source Oral, resp. rate (!) 22, height _0  (1.803 m), weight 126.6 kg, SpO2 93 %. Body mass index is 38.93 kg/m.  Physical Exam Constitutional:      General: He is not in acute distress. HENT:  Head: Normocephalic and atraumatic.  Eyes:     Extraocular Movements: Extraocular movements intact.     Conjunctiva/sclera: Conjunctivae normal.  Pulmonary:     Effort: Pulmonary effort is normal. No respiratory distress.  Abdominal:     General: There is no distension.     Palpations: Abdomen is soft.     Tenderness: There is no abdominal tenderness. There is no guarding or rebound.  Musculoskeletal:     Cervical back: Normal range of motion and neck supple.     Comments: Left upper extremity PICC line  in place nontender  Skin:    General: Skin is warm and dry.     Coloration: Skin is not jaundiced.     Findings: No rash.  Neurological:     General: No focal deficit present.     Mental Status: He is alert and oriented to person, place, and time.  Psychiatric:        Mood and Affect: Mood normal.        Behavior: Behavior normal.     Lab Results: Lab Results  Component Value Date   WBC 10.2 11/03/2021   HGB 8.4 (L) 11/03/2021   HCT 26.9 (L) 11/03/2021   MCV 90.6 11/03/2021   PLT 316 11/03/2021    Lab Results  Component Value Date   NA 136 11/02/2021   K 3.9 11/02/2021   CO2 22 11/02/2021   GLUCOSE 123 (H) 11/02/2021   BUN 15 11/02/2021   CREATININE 1.11 11/02/2021   CALCIUM 7.6 (L) 11/02/2021   GFRNONAA >60 11/02/2021   GFRAA 55 (L) 09/29/2020    Lab Results  Component Value Date   ALT 40 11/02/2021   AST 43 (H) 11/02/2021   ALKPHOS 74 11/02/2021   BILITOT 1.2 11/02/2021       Component Value Date/Time   CRP 15.7 (H) 11/01/2021 0056    No results found for: ESRSEDRATE   I have reviewed the micro and lab results in Epic.  Imaging: No results found.   Imaging independently reviewed in Epic.    [facility] for Infectious Disease [facility] [phone] pager 11/03/2021, 11:54 AM

## 2021-11-03 NOTE — Consult Note (Signed)
° °  [facility] CM Inpatient Consult   11/03/2021  Marlyn Rabine 1941-01-18 [id number]  [place] [facility] [ACO] Patient: [name] Young  Primary Care Provider:  Ria Bush, MD, Clover Mealy is an embedded provider with a Chronic Care Management team and program, and is listed for the transition of care follow up and appointments.  Patient was screened for Embedded practice service needs for chronic care management and found that patient is being recommended for a skilled nursing facility level of care for post hospital transition.  Plan: Patient needs to be met at a skilled nursing facility for [employer] needs. No community [facility] follow up however patient could benefit from Chronic Care Management with Embedded provider when back in community setting.  Please contact for further questions,  Natividad Brood, RN BSN [facility] Liaison  [phone] business mobile phone Toll free office [phone]  Fax number: [phone] [email or web address]

## 2021-11-04 DIAGNOSIS — I330 Acute and subacute infective endocarditis: Secondary | ICD-10-CM

## 2021-11-04 LAB — BASIC METABOLIC PANEL
Anion gap: 7 (ref 5–15)
BUN: 11 mg/dL (ref 8–23)
CO2: 23 mmol/L (ref 22–32)
Calcium: 8.2 mg/dL — ABNORMAL LOW (ref 8.9–10.3)
Chloride: 105 mmol/L (ref 98–111)
Creatinine, Ser: 1.14 mg/dL (ref 0.61–1.24)
GFR, Estimated: >60 mL/min (ref >60)
Glucose, Bld: 128 mg/dL — ABNORMAL HIGH (ref 70–99)
Potassium: 3.9 mmol/L (ref 3.5–5.1)
Sodium: 135 mmol/L (ref 135–145)

## 2021-11-04 LAB — GLUCOSE, CAPILLARY
Glucose-Capillary: 160 mg/dL — ABNORMAL HIGH (ref 70–99)
Glucose-Capillary: 143 mg/dL — ABNORMAL HIGH (ref 70–99)
Glucose-Capillary: 154 mg/dL — ABNORMAL HIGH (ref 70–99)
Glucose-Capillary: 165 mg/dL — ABNORMAL HIGH (ref 70–99)

## 2021-11-04 LAB — CBC
HCT: 26.5 % — ABNORMAL LOW (ref 39.0–52.0)
Hemoglobin: 8.4 g/dL — ABNORMAL LOW (ref 13.0–17.0)
MCH: 28.8 pg (ref 26.0–34.0)
MCHC: 31.7 g/dL (ref 30.0–36.0)
MCV: 90.8 fL (ref 80.0–100.0)
Platelets: 370 10*3/uL (ref 150–400)
RBC: 2.92 MIL/uL — ABNORMAL LOW (ref 4.22–5.81)
RDW: 16.6 % — ABNORMAL HIGH (ref 11.5–15.5)
WBC: 8.6 10*3/uL (ref 4.0–10.5)
nRBC: 0 % (ref 0.0–0.2)

## 2021-11-04 MED ORDER — SACUBITRIL-VALSARTAN 24-26 MG PO TABS: 1.0000 | ORAL_TABLET | Freq: Two times a day (BID) | ORAL | Status: DC

## 2021-11-04 MED ORDER — METOPROLOL SUCCINATE ER 50 MG PO TB24
50.0000 mg | ORAL_TABLET | Freq: Every day | ORAL | Status: DC
Start: 2021-11-05 — End: 2021-12-21

## 2021-11-04 MED ORDER — TAMSULOSIN HCL 0.4 MG PO CAPS: 0.4000 mg | ORAL_CAPSULE | Freq: Every day | ORAL | Status: DC

## 2021-11-04 MED ORDER — INSULIN ASPART 100 UNIT/ML IJ SOLN: 0.0000 [IU] | Freq: Three times a day (TID) | INTRAMUSCULAR | 11 refills | Status: DC

## 2021-11-04 MED ORDER — DAPAGLIFLOZIN PROPANEDIOL 5 MG PO TABS: 5.0000 mg | ORAL_TABLET | Freq: Every day | ORAL | Status: DC

## 2021-11-04 MED ORDER — CEFAZOLIN IV (FOR PTA / DISCHARGE USE ONLY): 2.0000 g | Freq: Three times a day (TID) | INTRAVENOUS | 0 refills | Status: DC

## 2021-11-04 MED ORDER — APIXABAN 5 MG PO TABS: 5.0000 mg | ORAL_TABLET | Freq: Two times a day (BID) | ORAL | Status: DC

## 2021-11-04 NOTE — Consult Note (Signed)
° °  [facility] CM Inpatient Consult   11/04/2021  Bharat Antillon Dec 21, 1940 [id number]  Follow up:  Call received  Call received from [phone] person identified themself as Earnest Conroy regarding discharge planning for this patient today.  Explained my role with [facility] Liaison for post hospital community support. Record reviewed to try to assist and find with the inpatient TOC coverage however, person states, "I would like to be asked than told and to speak with someone because I have an appointment today...here comes someone now." Person terminated the call.  No intervention follow up.  Natividad Brood, RN BSN [facility] Liaison  [phone] business mobile phone Toll free office [phone]  Fax number: [phone] [email or web address]

## 2021-11-04 NOTE — Progress Notes (Signed)
° °  Palliative Medicine Inpatient Follow Up Note  HPI:  Per hospitalist note  --> 81 year old gentleman with a history of coronary artery disease status post CABG 2019, paroxysmal atrial fibrillation, diabetes, GERD, CKD who presented with abdominal pain, found to have ascending cholangitis and bacteremia, he was kept in ICU was seen by GI underwent ERCP with gallstone extraction, he was also seen by ID for bacteremia.  He was transferred to my service on 10/28/2021 on day 5 of his hospital admission.   Palliative care has been asked to get involved to further discuss goals of care in the setting of prolonged hospitalization, bacteremia, and overall frail physical condition.  Today's Discussion (11/04/2021):  *Please note that this is a verbal dictation therefore any spelling or grammatical errors are due to the "[place] [employer]" system interpretation.  Chart reviewed inclusive of vital signs, progress notes, laboratory results, and diagnostic images.   I met with [name] Young, at bedside this morning. He was noted to be breathing comfortably on RA and eating a fruit cup. He denied pain this morning.  [name] Young shared with me that he looks forward to getting out of the hospital setting to gain strength to get home with his grandson. He shares that this is his reason for living as he and his grandson do nearly everything together. [name] Young shares that he essentially raised his grandson [name] Young who is 60yr old. He shares that he goes to his baseball games and that they go hunting and fishing together. He vocalized how proud of him he is to me.   JKramershares a few aspect of his life that are important to him inclusive of being there for his grandchildren. He shares that he wants to be around as long as within reasons for this purpose. I was able to offer emotional support through therapeutic listening.   Palliative Support Provided  Objective Assessment: Vital Signs Vitals:   11/04/21 0700 11/04/21  0800  BP:  113/62  Pulse: 68 75  Resp: (!) 23 16  Temp:  98.1 F (36.7 C)  SpO2: 90% 95%    Intake/Output Summary (Last 24 hours) at 11/04/2021 1119 Last data filed at 11/04/2021 07517Gross per 24 hour  Intake --  Output 3100 ml  Net -3100 ml    Last Weight  Most recent update: 11/03/2021  3:59 AM    Weight  126.6 kg (279 lb 1.6 oz)            Gen:  Elderly M in NAD HEENT: moist mucous membranes CV: Regular rate and irregular rhythm PULM: ON RA breathing is even and nonlabored ABD: soft/nontender EXT: No edema  Neuro: Alert and oriented x2-3   SUMMARY OF RECOMMENDATIONS   DNAR/DNI   Goal: To get back to baseline and back home  [place] [facility] [place]   TOC - OP Palliative at the time of discharge   Ongoing incremental Palliative support - Please call the team phone if support is needed  MDM - Moderate ______________________________________________________________________________________ [facility] Team Cell Phone: [phone] utilize secure chat with additional questions, if there is no response within 30 minutes please call the above phone number  Palliative Medicine Team providers are available by phone from 7am to 7pm daily and can be reached through the team cell phone.  Should this patient require assistance outside of these hours, please call the patient's attending physician.

## 2021-11-04 NOTE — Progress Notes (Signed)
Physical Therapy Treatment Patient Details Name: [name] Young. MRN: [id number] DOB: [birth year] Today's Date: 11/04/2021   History of Present Illness 81 y.o. male presented to the ED 10/23/21 with complaints of abdominal pain with associated nausea and vomiting that began the day before. In ED pt found to be in A-fib RVR, hypertensive, and tachypnea. CTA chest/abd/pelvis reveled dilated bile duct, acute cholelithiasis without cholecystis found to have cholangitis status post ERCP with gastroenterology on 10/23/2021.  His blood cultures on admission are positive for E. coli and Streptococcus species.  PMH significant for CAD s/p CAGB 2019 A-fib,, HTN. HLD, diabetes, GERD, and CKD.    PT Comments    Pt received in bed, agreeable to participation in therapy. He required mod assist supine to sit, min assist sit to stand from elevated surface, and min assist step pivot transfer with RW bed to recliner. 2/4 DOE with minimal activity. SpO2 90-94% on RA. Pt in recliner with feet elevated at end of session.    Recommendations for follow up therapy are one component of a multi-disciplinary discharge planning process, led by the attending physician.  Recommendations may be updated based on patient status, additional functional criteria and insurance authorization.  Follow Up Recommendations  Skilled nursing-short term rehab (<3 hours/day)     Assistance Recommended at Discharge Frequent or constant Supervision/Assistance  Patient can return home with the following Two people to help with walking and/or transfers;Help with stairs or ramp for entrance;Assist for transportation   Equipment Recommendations  Wheelchair (measurements PT)    Recommendations for Other Services       Precautions / Restrictions Precautions Precautions: Fall     Mobility  Bed Mobility Overal bed mobility: Needs Assistance Bed Mobility: Supine to Sit     Supine to sit: Mod assist, HOB elevated     General bed  mobility comments: +rail, cues for sequencing, assist with BLE and trunk, use of bed pad to scoot to EOB    Transfers Overall transfer level: Needs assistance Equipment used: Rolling walker (2 wheels) Transfers: Sit to/from Stand Sit to Stand: Min assist, From elevated surface   Step pivot transfers: Min assist       General transfer comment: assist to power up and stabilize balance, pivot steps with RW bed to recliner    Ambulation/Gait                   Stairs             Wheelchair Mobility    Modified Rankin (Stroke Patients Only)       Balance Overall balance assessment: Needs assistance Sitting-balance support: No upper extremity supported, Feet supported Sitting balance-Leahy Scale: Good     Standing balance support: Bilateral upper extremity supported, During functional activity, Reliant on assistive device for balance Standing balance-Leahy Scale: Poor                              Cognition Arousal/Alertness: Awake/alert Behavior During Therapy: WFL for tasks assessed/performed Overall Cognitive Status: Impaired/Different from baseline Area of Impairment: Problem solving, Safety/judgement, Awareness, Memory                     Memory: Decreased short-term memory   Safety/Judgement: Decreased awareness of deficits Awareness: Emergent Problem Solving: Slow processing, Decreased initiation, Requires verbal cues, Difficulty sequencing, Requires tactile cues          Exercises  General Comments        Pertinent Vitals/Pain Pain Assessment Pain Assessment: No/denies pain    Home Living                          Prior Function            PT Goals (current goals can now be found in the care plan section) Acute Rehab PT Goals Patient Stated Goal: go to his grandson's baseball games Progress towards PT goals: Progressing toward goals    Frequency    Min 3X/week      PT Plan Current plan  remains appropriate    Co-evaluation              AM-PAC PT "6 Clicks" Mobility   Outcome Measure  Help needed turning from your back to your side while in a flat bed without using bedrails?: A Lot Help needed moving from lying on your back to sitting on the side of a flat bed without using bedrails?: A Lot Help needed moving to and from a bed to a chair (including a wheelchair)?: A Little Help needed standing up from a chair using your arms (e.g., wheelchair or bedside chair)?: A Lot Help needed to walk in hospital room?: Total Help needed climbing 3-5 steps with a railing? : Total 6 Click Score: 11    End of Session Equipment Utilized During Treatment: Gait belt Activity Tolerance: Patient tolerated treatment well Patient left: in chair;with call bell/phone within reach;with chair alarm set Nurse Communication: Mobility status PT Visit Diagnosis: Muscle weakness (generalized) (M62.81);Difficulty in walking, not elsewhere classified (R26.2)     Time: 3500-9381 PT Time Calculation (min) (ACUTE ONLY): 25 min  Charges:  $Gait Training: 8-22 mins $Therapeutic Activity: 8-22 mins                     Lorrin Goodell, PT  Office # [phone] Pager [phone]    Lorriane Shire 11/04/2021, 9:06 AM

## 2021-11-04 NOTE — Discharge Instructions (Signed)

## 2021-11-04 NOTE — TOC Progression Note (Signed)
Transition of Care (TOC) - Progression Note    Patient Details  Name: [name] Young. MRN: [id number] Date of Birth: 03/20/1941  Transition of Care [facility]) CM/SW [place], LCSW Phone Number: 11/04/2021, 2:51 PM  Clinical Narrative:    11am-CSW spoke with patient's daughter outside of the room. She shared concern over patient discharging tomorrow and using up his SNF days but not receiving therapy since it is the weekend. MD present and explained lack of medical necessity to stay in the hospital. Daughter shared nursing concerns, which CSW shared with RN leadership on the unit.   CSW contacted admissions at [facility] and they reported that if patient admits to [place] after 5pm on Saturday then patient can receive therapy on Sunday. CSW provided requested discharge summary today and facility confirmed they have the IV antibiotic ready.  CSW went to patient's room where wife was present and shared information with them. Wife agreeable to discharge later on Saturday so that patient can get therapy on Sunday. Patient will require non-emergency transport.    Expected Discharge Plan: [place] Barriers to Discharge: Continued Medical Work up  Expected Discharge Plan and Services Expected Discharge Plan: [place] In-house Referral: Clinical Social Work   Post Acute Care Choice:  (TBD) Living arrangements for the past 2 months: Single Family Home                                       Social Determinants of Health (SDOH) Interventions    Readmission Risk Interventions No flowsheet data found.

## 2021-11-04 NOTE — Progress Notes (Signed)
Patient's family asked to speak regarding come concerns.  Patient arrived to 5W on 2/16 and was documented that 2 RN skin check completed.  Daily skin checks should have been performed in between, but nothing in LDAs/skin assessment regarding excoriation around buttocks and thighs.  There is also a mark from [facility] pinching him on the right upper thigh that is not documented.  Mepilexes on heels were marked 2/13 and have not been changed since.  Daughter also stated that RNs have not listed to his lungs in several days- patient is AAOx4 and does not recall the last time they did. Additionally, was told RN on Tuesday would not help pull patient up in bed stating, "I'm not going to hurt my back".  She also was informed of the pinched spot from the NT (Bre) and did not document anything.   Relayed concerns to [facility] [employer]), placed new mepilex on patient's bottom, and provided emotional support to family.

## 2021-11-04 NOTE — Discharge Summary (Signed)
Physician Discharge Summary  [name] Young. WNI:[id number] DOB: 1941-07-26 DOA: 10/23/2021  PCP: Ria Bush, MD  Admit date: 10/23/2021   Admitted From: Home Disposition:  SNF   This is a prelim discharge summary given for SNF in anticipation of discharge 2/25.    Discharge Diagnoses:  Principal Problem:   Bacteremia due to Streptococcus Active Problems:   Coronary artery disease   Atrial fibrillation (HCC)   Sepsis (HCC)   Calculus of bile duct with cholangitis and obstruction   Jaundice   Elevated LFTs   Hyperbilirubinemia   Bacteremia due to Escherichia coli   Palliative care by specialist    Discharge Instructions  Discharge Instructions     [facility] pharmacist to adjust dose for Vancomycin, Aminoglycosides and other anti-infective therapies as requested by physician.   Complete by: As directed    [facility] to provide Cath Flo 16m   Complete by: As directed    Administer for PICC line occlusion and as ordered by physician for other access device issues.   Anaphylaxis Kit: Provided to treat any anaphylactic reaction to the medication being provided to the patient if First Dose or when requested by physician   Complete by: As directed    Epinephrine 1164mml vial / amp: Administer 0.64m39m0.64ml37mubcutaneously once for moderate to severe anaphylaxis, nurse to call physician and pharmacy when reaction occurs and call 911 if needed for immediate care   Diphenhydramine 50mg71mIV vial: Administer 25-50mg 66mM PRN for first dose reaction, rash, itching, mild reaction, nurse to call physician and pharmacy when reaction occurs   Sodium Chloride 0.9% NS 500ml I41mdminister if needed for hypovolemic blood pressure drop or as ordered by physician after call to physician with anaphylactic reaction   Change dressing on IV access line weekly and PRN   Complete by: As directed    Flush IV access with Sodium Chloride 0.9% and Heparin 10 units/ml  or 100 units/ml   Complete by: As directed    Home infusion instructions - [facility]   Complete by: As directed    Instructions: Flush IV access with Sodium Chloride 0.9% and Heparin 10units/ml or 100units/ml   Change dressing on IV access line: Weekly and PRN   Instructions Cath Flo 2mg: Ad102mister for PICC Line occlusion and as ordered by physician for other access device   [facility] pharmacist to adjust dose for: Vancomycin, Aminoglycosides and other anti-infective therapies as requested by physician   Method of administration may be changed at the discretion of home infusion pharmacist based upon assessment of the patient and/or caregivers ability to self-administer the medication ordered   Complete by: As directed       Allergies as of 11/04/2021   No Known Allergies      Medication List     STOP taking these medications    aspirin EC 81 MG tablet   lisinopril 40 MG tablet Commonly known as: ZESTRIL   metFORMIN 500 MG tablet Commonly known as: GLUCOPHAGE   sitaGLIPtin 50 MG tablet Commonly known as: Januvia       TAKE these medications    Accu-Chek Aviva Plus test strip Generic drug: glucose blood USE AS DIRECTED TO CHECK BLOOD SUGARS UPTO FOUR TIMES DAILY.   apixaban 5 MG Tabs tablet Commonly known as: ELIQUIS Take 1 tablet (5 mg total) by mouth 2 (two) times daily.   B-12 1000 MCG Subl Place 1 tablet under the tongue daily.   blood  glucose meter kit and supplies Dispense based on patient and insurance preference. Use up to four times daily as directed. (FOR ICD-10 E10.9, E11.9).   ceFAZolin  IVPB Commonly known as: ANCEF Inject 2 g into the vein every 8 (eight) hours. Indication:  MV endocarditis First Dose: Yes Last Day of Therapy:  12/05/21 Labs - Once weekly:  CBC/D and BMP, Labs - Every other week:  ESR and CRP Method of administration: IV Push Method of administration may be changed at the discretion of home infusion  pharmacist based upon assessment of the patient and/or caregiver's ability to self-administer the medication ordered.   dapagliflozin propanediol 5 MG Tabs tablet Commonly known as: FARXIGA Take 1 tablet (5 mg total) by mouth daily. Start taking on: November 05, 2021   fenofibrate 145 MG tablet Commonly known as: TRICOR Take 1 tablet (145 mg total) by mouth daily.   Fish Oil 1200 MG Caps Take 2 capsules (2,400 mg total) by mouth daily.   insulin aspart 100 UNIT/ML injection Commonly known as: novoLOG Inject 0-15 Units into the skin 3 (three) times daily with meals.   MACULAR VITAMIN BENEFIT PO Take 1 tablet by mouth daily.   metoprolol succinate 50 MG 24 hr tablet Commonly known as: TOPROL-XL Take 1 tablet (50 mg total) by mouth daily. Take with or immediately following a meal. Start taking on: November 05, 2021 What changed:  medication strength how much to take how to take this when to take this additional instructions   nitroGLYCERIN 0.4 MG SL tablet Commonly known as: Nitrostat Place 1 tablet (0.4 mg total) under the tongue every 5 (five) minutes as needed for chest pain.   omeprazole 40 MG capsule Commonly known as: PRILOSEC Take 1 capsule (40 mg total) by mouth every Monday, Wednesday, and Friday.   rosuvastatin 10 MG tablet Commonly known as: CRESTOR TAKE 1 TABLET BY MOUTH ONCE A DAY   sacubitril-valsartan 24-26 MG Commonly known as: ENTRESTO Take 1 tablet by mouth 2 (two) times daily.   tamsulosin 0.4 MG Caps capsule Commonly known as: FLOMAX Take 1 capsule (0.4 mg total) by mouth daily. Start taking on: November 05, 2021   Vitamin D3 25 MCG (1000 UT) Caps Take 1 capsule (1,000 Units total) by mouth daily.               Discharge Care Instructions  (From admission, onward)           Start     Ordered   11/04/21 0000  Change dressing on IV access line weekly and PRN  (Home infusion instructions - [facility] )        11/04/21  1246            Follow-up Information     End, Harrell Gave, MD Follow up on 11/16/2021.   Specialty: Cardiology Why: Appointment at 9:20 AM Contact information: [address] [place] [address] [phone]                No Known Allergies    Procedures/Studies: CT HEAD WO CONTRAST (5MM)  Result Date: 10/23/2021 CLINICAL DATA:  Delirium EXAM: CT HEAD WITHOUT CONTRAST TECHNIQUE: Contiguous axial images were obtained from the base of the skull through the vertex without intravenous contrast. RADIATION DOSE REDUCTION: This exam was performed according to the departmental dose-optimization program which includes automated exposure control, adjustment of the mA and/or kV according to patient size and/or use of iterative reconstruction technique. COMPARISON:  None. FINDINGS:  Brain: No evidence of acute infarction, hemorrhage, hydrocephalus, extra-axial collection or mass lesion/mass effect. Mild cerebral volume loss and white matter low-density in keeping with age. Vascular: No hyperdense vessel or unexpected calcification. Skull: Normal. Negative for fracture or focal lesion. Sinuses/Orbits: No acute finding. IMPRESSION: No acute finding.  Unremarkable study for age. Electronically Signed   By: Jorje Guild M.D.   On: 10/23/2021 07:15   DG Chest Port 1 View  Result Date: 10/31/2021 CLINICAL DATA:  Shortness of breath in a 81 year old male. EXAM: PORTABLE CHEST 1 VIEW COMPARISON:  Comparison with October 28, 2021. FINDINGS: EKG leads project over the chest. Study limited by patient condition by report. Median sternotomy changes and changes of coronary revascularization. Heart size is stable. No lobar consolidative process. Minimal LEFT basilar airspace disease with slightly improved aeration in the chest compared to previous imaging. No visible pneumothorax. On limited assessment there is no acute skeletal finding. IMPRESSION: Minimal LEFT basilar airspace disease  with slightly improved aeration in the chest compared to previous imaging. No other interval change. Electronically Signed   By: Zetta Bills M.D.   On: 10/31/2021 11:52   DG Chest Port 1 View  Result Date: 10/28/2021 CLINICAL DATA:  Shortness of breath EXAM: PORTABLE CHEST 1 VIEW COMPARISON:  Radiograph 10/23/2021 FINDINGS: Unchanged enlarged cardiac silhouette. Prior median sternotomy and CABG. There are left basilar opacities. Small bilateral pleural effusions, left greater than right. No visible pneumothorax. No acute osseous abnormality. IMPRESSION: Small bilateral pleural effusions, left greater than right. Left basilar opacities could be atelectasis or infection. Unchanged cardiomegaly. Electronically Signed   By: Maurine Simmering M.D.   On: 10/28/2021 11:58   DG Chest Portable 1 View  Result Date: 10/23/2021 CLINICAL DATA:  AFib with RVR EXAM: PORTABLE CHEST 1 VIEW COMPARISON:  03/06/2018 FINDINGS: Lungs are clear.  No pleural effusion or pneumothorax. Stable mild cardiomegaly. Postsurgical changes related to prior CABG. Median sternotomy. IMPRESSION: Stable mild cardiomegaly. No evidence of acute cardiopulmonary disease. Electronically Signed   By: Julian Hy M.D.   On: 10/23/2021 03:55   DG ERCP WITH SPHINCTEROTOMY  Result Date: 10/23/2021 CLINICAL DATA:  Chest and back pain Cholelithiasis Dilated CBD on CT EXAM: ERCP TECHNIQUE: Multiple spot images obtained with the fluoroscopic device and submitted for interpretation post-procedure. FLUOROSCOPY: Radiation Exposure Index (as provided by the fluoroscopic device): 65.5 mGy Kerma COMPARISON:  None. FINDINGS: Thirteen images were submitted for interpretation. The submitted images demonstrate cannulation of the common bile duct. A filling defect is seen in the mid duct on the initial images which is no longer identified following balloon sweep. IMPRESSION: Intraoperative fluoroscopic images of ERCP as above. These images were submitted for  radiologic interpretation only. Please see the procedural report for the amount of contrast and the fluoroscopy time utilized. Electronically Signed   By: Miachel Roux M.D.   On: 10/23/2021 11:45   ECHOCARDIOGRAM COMPLETE  Result Date: 10/23/2021    ECHOCARDIOGRAM REPORT   Patient Name:   [name] Young. Date of Exam: 10/23/2021 Medical Rec #:  [id number]            Height:       71.0 in Accession #:    [id number]           Weight:       230.0 lb Date of Birth:  [birth year]           BSA:          2.238 m Patient Age:  80 years             BP:           96/53 mmHg Patient Gender: M                    HR:           92 bpm. Exam Location:  Inpatient Procedure: 2D Echo Indications:    dyspnea  History:        Patient has prior history of Echocardiogram examinations, most                 recent 01/18/2018. Prior CABG, Sepsis. Chronic kidney disease;                 Risk Factors:Diabetes, Dyslipidemia and Hypertension.  Sonographer:    Johny Chess RDCS Referring Phys: [place]  1. Left ventricular ejection fraction, by estimation, is 40 to 45%. The left ventricle has mildly decreased function. The left ventricle demonstrates global hypokinesis. Left ventricular diastolic parameters are consistent with Grade II diastolic dysfunction (pseudonormalization). Elevated left ventricular end-diastolic pressure.  2. Right ventricular systolic function is normal. The right ventricular size is normal. There is normal pulmonary artery systolic pressure. The estimated right ventricular systolic pressure is 93.2 mmHg.  3. Left atrial size was mildly dilated.  4. The mitral valve is degenerative. Trivial mitral valve regurgitation. No evidence of mitral stenosis.  5. The aortic valve is calcified. There is moderate calcification of the aortic valve. There is moderate thickening of the aortic valve. Aortic valve regurgitation is not visualized. Aortic valve sclerosis/calcification is present,  without any evidence of aortic stenosis.  6. There is mild dilatation of the aortic root, measuring 41 mm. There is mild dilatation of the ascending aorta, measuring 38 mm.  7. The inferior vena cava is dilated in size with >50% respiratory variability, suggesting right atrial pressure of 8 mmHg. FINDINGS  Left Ventricle: Left ventricular ejection fraction, by estimation, is 40 to 45%. The left ventricle has mildly decreased function. The left ventricle demonstrates global hypokinesis. The left ventricular internal cavity size was normal in size. There is  no left ventricular hypertrophy. Left ventricular diastolic parameters are consistent with Grade II diastolic dysfunction (pseudonormalization). Elevated left ventricular end-diastolic pressure. Right Ventricle: The right ventricular size is normal. No increase in right ventricular wall thickness. Right ventricular systolic function is normal. There is normal pulmonary artery systolic pressure. The tricuspid regurgitant velocity is 2.02 m/s, and  with an assumed right atrial pressure of 8 mmHg, the estimated right ventricular systolic pressure is 67.1 mmHg. Left Atrium: Left atrial size was mildly dilated. Right Atrium: Right atrial size was normal in size. Pericardium: There is no evidence of pericardial effusion. Mitral Valve: The mitral valve is degenerative in appearance. There is moderate thickening of the mitral valve leaflet(s). There is moderate calcification of the mitral valve leaflet(s). Mild mitral annular calcification. Trivial mitral valve regurgitation. No evidence of mitral valve stenosis. Tricuspid Valve: The tricuspid valve is normal in structure. Tricuspid valve regurgitation is trivial. No evidence of tricuspid stenosis. Aortic Valve: The aortic valve is calcified. There is moderate calcification of the aortic valve. There is moderate thickening of the aortic valve. Aortic valve regurgitation is not visualized. Aortic valve  sclerosis/calcification is present, without any  evidence of aortic stenosis. Pulmonic Valve: The pulmonic valve was normal in structure. Pulmonic valve regurgitation is trivial. No evidence of pulmonic stenosis. Aorta: The aortic root is  normal in size and structure. There is mild dilatation of the aortic root, measuring 41 mm. There is mild dilatation of the ascending aorta, measuring 38 mm. Venous: The inferior vena cava is dilated in size with greater than 50% respiratory variability, suggesting right atrial pressure of 8 mmHg. IAS/Shunts: No atrial level shunt detected by color flow Doppler.  LEFT VENTRICLE PLAX 2D LVIDd:         6.30 cm      Diastology LVIDs:         4.60 cm      LV e' medial:    5.87 cm/s LV PW:         1.10 cm      LV E/e' medial:  16.7 LV IVS:        1.00 cm      LV e' lateral:   7.29 cm/s LVOT diam:     2.60 cm      LV E/e' lateral: 13.5 LV SV:         73 LV SV Index:   33 LVOT Area:     5.31 cm  LV Volumes (MOD) LV vol d, MOD A2C: 115.0 ml LV vol s, MOD A2C: 75.8 ml LV SV MOD A2C:     39.2 ml RIGHT VENTRICLE            IVC RV S prime:     9.03 cm/s  IVC diam: 2.60 cm TAPSE (M-mode): 1.3 cm LEFT ATRIUM             Index        RIGHT ATRIUM           Index LA diam:        4.30 cm 1.92 cm/m   RA Area:     17.00 cm LA Vol (A2C):   87.2 ml 38.97 ml/m  RA Volume:   43.90 ml  19.62 ml/m LA Vol (A4C):   66.9 ml 29.90 ml/m LA Biplane Vol: 77.2 ml 34.50 ml/m  AORTIC VALVE LVOT Vmax:   78.30 cm/s LVOT Vmean:  52.600 cm/s LVOT VTI:    0.138 m  AORTA Ao Root diam: 4.10 cm Ao Asc diam:  3.80 cm MITRAL VALVE               TRICUSPID VALVE MV Area (PHT): 4.49 cm    TR Peak grad:   16.3 mmHg MV Decel Time: 169 msec    TR Vmax:        202.00 cm/s MV E velocity: 98.20 cm/s MV A velocity: 83.10 cm/s  SHUNTS MV E/A ratio:  1.18        Systemic VTI:  0.14 m                            Systemic Diam: 2.60 cm Fransico Him MD Electronically signed by Fransico Him MD Signature Date/Time: 10/23/2021/4:51:21 PM     Final    ECHO TEE  Result Date: 10/28/2021    TRANSESOPHOGEAL ECHO REPORT   Patient Name:   [name] Young. Date of Exam: 10/28/2021 Medical Rec #:  [id number]            Height:       71.0 in Accession #:    [id number]           Weight:       264.6 lb Date of Birth:  10-15-1940  BSA:          2.375 m Patient Age:    33 years             BP:           169/70 mmHg Patient Gender: M                    HR:           77 bpm. Exam Location:  Inpatient Procedure: Transesophageal Echo, Cardiac Doppler and Color Doppler Indications:     Bacteremia  History:         Patient has prior history of Echocardiogram examinations, most                  recent 10/24/2021. CAD, Abnormal ECG and Prior CABG,                  Arrythmias:Atrial Fibrillation, Signs/Symptoms:Bacteremia; Risk                  Factors:Diabetes.  Sonographer:     Roseanna Rainbow RDCS Referring Phys:  [id number] [facility] Diagnosing Phys: Lyman Bishop MD PROCEDURE: After discussion of the risks and benefits of a TEE, an informed consent was obtained from the patient. The transesophogeal probe was passed without difficulty through the esophogus of the patient. Imaged were obtained with the patient in a supine position. Sedation performed by different physician. The patient was monitored while under deep sedation. Anesthestetic sedation was provided intravenously by Anesthesiology: 153m of Propofol. The patient developed no complications during the procedure. IMPRESSIONS  1. Left ventricular ejection fraction, by estimation, is 40 to 45%. The left ventricle has mildly decreased function. The left ventricle demonstrates global hypokinesis.  2. Right ventricular systolic function is normal. The right ventricular size is normal.  3. No left atrial/left atrial appendage thrombus was detected.  4. Diffuse thickening and increased echogenicity of the anterior mitral leaflet with small mobile vegetation, consistent with endocarditis. The mitral valve  is abnormal. Mild mitral valve regurgitation.  5. Small vegetation on the mitral valve.  6. No vegetation, Lambl's noted on the leaflet tips. The aortic valve is tricuspid. There is moderate thickening of the aortic valve. Aortic valve regurgitation is not visualized.  7. Aortic dilatation noted. There is borderline dilatation of the ascending aorta, measuring 38 mm. Conclusion(s)/Recommendation(s): Findings are concerning for vegetation/infective endocarditis as detailed above. FINDINGS  Left Ventricle: Left ventricular ejection fraction, by estimation, is 40 to 45%. The left ventricle has mildly decreased function. The left ventricle demonstrates global hypokinesis. The left ventricular internal cavity size was normal in size. Right Ventricle: The right ventricular size is normal. No increase in right ventricular wall thickness. Right ventricular systolic function is normal. Left Atrium: Left atrial size was normal in size. No left atrial/left atrial appendage thrombus was detected. Right Atrium: Right atrial size was normal in size. Pericardium: There is no evidence of pericardial effusion. Mitral Valve: Diffuse thickening and increased echogenicity of the anterior mitral leaflet with small mobile vegetation, consistent with endocarditis. The mitral valve is abnormal. A small vegetation is seen on the anterior mitral leaflet. The MV vegetation measures 5 mm x 5 mm. Mild mitral valve regurgitation. Tricuspid Valve: The tricuspid valve is grossly normal. Tricuspid valve regurgitation is trivial. Aortic Valve: No vegetation, Lambl's noted on the leaflet tips. The aortic valve is tricuspid. There is moderate thickening of the aortic valve. Aortic valve regurgitation is not visualized. Pulmonic Valve: The pulmonic  valve was normal in structure. Pulmonic valve regurgitation is not visualized. Aorta: Aortic dilatation noted. There is borderline dilatation of the ascending aorta, measuring 38 mm. IAS/Shunts: No atrial  level shunt detected by color flow Doppler. Lyman Bishop MD Electronically signed by Lyman Bishop MD Signature Date/Time: 10/28/2021/2:25:50 PM    Final    CT Angio Chest/Abd/Pel for Dissection W and/or Wo Contrast  Result Date: 10/23/2021 CLINICAL DATA:  81 year old male with history of chest and back pain. Suspected aortic dissection. EXAM: CT ANGIOGRAPHY CHEST, ABDOMEN AND PELVIS TECHNIQUE: Non-contrast CT of the chest was initially obtained. Multidetector CT imaging through the chest, abdomen and pelvis was performed using the standard protocol during bolus administration of intravenous contrast. Multiplanar reconstructed images and MIPs were obtained and reviewed to evaluate the vascular anatomy. RADIATION DOSE REDUCTION: This exam was performed according to the departmental dose-optimization program which includes automated exposure control, adjustment of the mA and/or kV according to patient size and/or use of iterative reconstruction technique. CONTRAST:  39m OMNIPAQUE IOHEXOL 350 MG/ML SOLN COMPARISON:  Chest CT 01/17/2018. FINDINGS: CTA CHEST FINDINGS Cardiovascular: Precontrast images demonstrate no crescentic high attenuation associated with the wall of the thoracic aorta to suggest the presence of acute intramural hemorrhage. Postcontrast images demonstrate no evidence of thoracic aortic aneurysm or dissection. Heart size is normal. There is no significant pericardial fluid, thickening or pericardial calcification. There is aortic atherosclerosis, as well as atherosclerosis of the great vessels of the mediastinum and the coronary arteries, including calcified atherosclerotic plaque in the left main, left anterior descending, left circumflex and right coronary arteries. Status post median sternotomy for CABG including LIMA to the LAD. Calcifications of the aortic valve. Mediastinum/Nodes: No pathologically enlarged mediastinal or hilar lymph nodes. Esophagus is unremarkable in appearance. No  axillary lymphadenopathy. Lungs/Pleura: No acute consolidative airspace disease. No pleural effusions. No definite suspicious appearing pulmonary nodules or masses are noted on today's examination which is limited by considerable patient motion. Dependent areas of subsegmental atelectasis or scarring are noted in the lung bases bilaterally. Musculoskeletal: Median sternotomy wires. There are no aggressive appearing lytic or blastic lesions noted in the visualized portions of the skeleton. Review of the MIP images confirms the above findings. CTA ABDOMEN AND PELVIS FINDINGS VASCULAR Aorta: Normal caliber aorta without aneurysm, dissection, vasculitis or significant stenosis. Celiac: Patent without evidence of aneurysm, dissection, vasculitis or significant stenosis. SMA: Patent without evidence of aneurysm, dissection, vasculitis or significant stenosis. Renals: Both renal arteries are patent without evidence of aneurysm, dissection, vasculitis, fibromuscular dysplasia or significant stenosis. IMA: Patent without evidence of aneurysm, dissection, vasculitis or significant stenosis. Inflow: Patent without evidence of aneurysm, dissection, vasculitis or significant stenosis. Veins: No obvious venous abnormality within the limitations of this arterial phase study. Review of the MIP images confirms the above findings. NON-VASCULAR Hepatobiliary: No suspicious cystic or solid hepatic lesions are confidently identified on today's arterial phase examination. Partially calcified gallstone measuring 1.1 cm in diameter lying dependently in the gallbladder. No findings to suggest an acute cholecystitis are noted at this time. Common bile duct measures 11 mm in the porta hepatis. No calcified stones are identified in the common bile duct. No intrahepatic biliary ductal dilatation. Pancreas: No pancreatic mass. No pancreatic ductal dilatation. No pancreatic or peripancreatic fluid collections or inflammatory changes. Spleen:  Unremarkable. Adrenals/Urinary Tract: 5 mm calculus at the right ureteropelvic junction (axial image 191 of series 7). No proximal hydroureteronephrosis to indicate active obstruction at this time. There is extensive perinephric stranding bilaterally (nonspecific). Low-attenuation  lesions in both kidneys, compatible with simple cysts, measuring up to 4.4 cm in the lower pole of the right kidney. Urinary bladder is normal in appearance. Bilateral adrenal glands are normal in appearance. Stomach/Bowel: Normal appearance of the stomach. No pathologic dilatation of small bowel or colon. Numerous colonic diverticulae are noted, particularly in the sigmoid colon, without definite focal surrounding inflammatory changes to clearly indicate an associated diverticulitis at this time. The appendix is not confidently identified and may be surgically absent. Regardless, there are no inflammatory changes noted adjacent to the cecum to suggest the presence of an acute appendicitis at this time. Lymphatic: No lymphadenopathy noted in the abdomen or pelvis. Reproductive: Prostate gland and seminal vesicles are unremarkable in appearance. Other: No significant volume of ascites.  No pneumoperitoneum. Musculoskeletal: There are no aggressive appearing lytic or blastic lesions noted in the visualized portions of the skeleton. Review of the MIP images confirms the above findings. IMPRESSION: 1. No evidence of acute aortic syndrome. 2. No acute findings in the thorax to account for the patient's symptoms. 3. 5 mm calculus at the right ureteropelvic junction. At this time, there is no proximal hydronephrosis to indicate urinary tract obstruction. 4. Cholelithiasis without evidence of acute cholecystitis. 5. Dilatation of the common bile duct. No intrahepatic biliary ductal dilatation to clearly indicate biliary tract obstruction. Additionally, there is no calcified choledocholithiasis. This is of uncertain etiology and significance, and  could be age related, however, correlation with liver function tests is recommended. If there is clinical concern for biliary tract obstruction, further evaluation with abdominal MRI with and without IV gadolinium with MRCP would be recommended. 6. Aortic atherosclerosis, in addition to left main and three-vessel coronary artery disease. Status post median sternotomy for CABG including LIMA to the LAD. 7. There are calcifications of the aortic valve. Echocardiographic correlation for evaluation of potential valvular dysfunction may be warranted if clinically indicated. 8. Additional incidental findings, as above. Electronically Signed   By: Vinnie Langton M.D.   On: 10/23/2021 07:20   [place] EKG SITE RITE  Result Date: 10/28/2021 If Site Rite image not attached, placement could not be confirmed due to current cardiac rhythm.     Discharge Exam: Vitals:   11/04/21 0800 11/04/21 1209  BP: 113/62 (!) 135/55  Pulse: 75 76  Resp: 16 20  Temp: 98.1 F (36.7 C) 97.6 F (36.4 C)  SpO2: 95% 97%   Vitals:   11/04/21 0600 11/04/21 0700 11/04/21 0800 11/04/21 1209  BP:   113/62 (!) 135/55  Pulse: 68 68 75 76  Resp: 20 (!) 23 16 20   Temp:   98.1 F (36.7 C) 97.6 F (36.4 C)  TempSrc:   Oral Oral  SpO2: 93% 90% 95% 97%  Weight:      Height:         Microbiology: Recent Results (from the past 240 hour(s))  Urine Culture     Status: None   Collection Time: 10/31/21 11:23 AM   Specimen: Urine, Clean Catch  Result Value Ref Range Status   Specimen Description URINE, CLEAN CATCH  Final   Special Requests NONE  Final   Culture   Final    NO GROWTH Performed at [facility], [address] [place], [place] [address]    Report Status 11/01/2021 FINAL  Final  Resp Panel by RT-PCR (Flu A&B, Covid) Nasopharyngeal Swab     Status: None   Collection Time: 10/31/21 12:53 PM   Specimen: Nasopharyngeal Swab; Nasopharyngeal(NP) swabs  in vial transport medium  Result Value Ref Range Status    SARS Coronavirus 2 by RT PCR NEGATIVE NEGATIVE Final    Comment: (NOTE) SARS-CoV-2 target nucleic acids are NOT DETECTED.  The SARS-CoV-2 RNA is generally detectable in upper respiratory specimens during the acute phase of infection. The lowest concentration of SARS-CoV-2 viral copies this assay can detect is 138 copies/mL. A negative result does not preclude SARS-Cov-2 infection and should not be used as the sole basis for treatment or other patient management decisions. A negative result may occur with  improper specimen collection/handling, submission of specimen other than nasopharyngeal swab, presence of viral mutation(s) within the areas targeted by this assay, and inadequate number of viral copies(<138 copies/mL). A negative result must be combined with clinical observations, patient history, and epidemiological information. The expected result is Negative.  Fact Sheet for Patients:  EntrepreneurPulse.com.au  Fact Sheet for Healthcare Providers:  IncredibleEmployment.be  This test is no t yet approved or cleared by the [place] FDA and  has been authorized for detection and/or diagnosis of SARS-CoV-2 by FDA under an Emergency Use Authorization (EUA). This EUA will remain  in effect (meaning this test can be used) for the duration of the COVID-19 declaration under Section 564(b)(1) of the Act, 21 U.S.C.section 360bbb-3(b)(1), unless the authorization is terminated  or revoked sooner.       Influenza A by PCR NEGATIVE NEGATIVE Final   Influenza B by PCR NEGATIVE NEGATIVE Final    Comment: (NOTE) The Xpert Xpress SARS-CoV-2/FLU/RSV plus assay is intended as an aid in the diagnosis of influenza from Nasopharyngeal swab specimens and should not be used as a sole basis for treatment. Nasal washings and aspirates are unacceptable for Xpert Xpress SARS-CoV-2/FLU/RSV testing.  Fact Sheet for  Patients: EntrepreneurPulse.com.au  Fact Sheet for Healthcare Providers: IncredibleEmployment.be  This test is not yet approved or cleared by the [place] FDA and has been authorized for detection and/or diagnosis of SARS-CoV-2 by FDA under an Emergency Use Authorization (EUA). This EUA will remain in effect (meaning this test can be used) for the duration of the COVID-19 declaration under Section 564(b)(1) of the Act, 21 U.S.C. section 360bbb-3(b)(1), unless the authorization is terminated or revoked.  Performed at [facility], [place] [address], [place], [place] [address]   Culture, blood (routine x 2)     Status: None (Preliminary result)   Collection Time: 10/31/21  1:56 PM   Specimen: BLOOD RIGHT HAND  Result Value Ref Range Status   Specimen Description BLOOD RIGHT HAND  Final   Special Requests   Final    BOTTLES DRAWN AEROBIC AND ANAEROBIC Blood Culture adequate volume   Culture   Final    NO GROWTH 4 DAYS Performed at [facility], [place] [address], [place], [place] [address]    Report Status PENDING  Incomplete  Culture, blood (routine x 2)     Status: None (Preliminary result)   Collection Time: 10/31/21  2:03 PM   Specimen: BLOOD RIGHT WRIST  Result Value Ref Range Status   Specimen Description BLOOD RIGHT WRIST  Final   Special Requests   Final    BOTTLES DRAWN AEROBIC AND ANAEROBIC Blood Culture adequate volume   Culture   Final    NO GROWTH 4 DAYS Performed at [facility], [place] [address], [place], [place] [address]    Report Status PENDING  Incomplete     Labs: BNP (last 3 results) Recent Labs  10/29/21 0209 10/30/21 0127  BNP 727.2* 989.2*   Basic Metabolic Panel: Recent Labs  Lab 10/29/21 0209 10/30/21 0127 10/31/21 0145 11/01/21 0056 11/02/21 0252 11/04/21 0418  NA 141 138 138 138 136 135  K 3.5 3.6 3.6 3.6 3.9 3.9  CL 107 105 105 106 105 105  CO2 24 23 25 25 22 23   GLUCOSE 174*  155* 161* 132* 123* 128*  BUN 35* 25* 22 20 15 11   CREATININE 1.14 1.08 1.11 1.18 1.11 1.14  CALCIUM 7.8* 7.6* 7.7* 7.7* 7.6* 8.2*  MG 1.9 1.7 2.0 2.0  --   --    Liver Function Tests: Recent Labs  Lab 10/29/21 0209 10/30/21 0127 10/31/21 0145 11/01/21 0056 11/02/21 0252  AST 32 27 24 64* 43*  ALT 104* 63* 46* 51* 40  ALKPHOS 97 85 79 77 74  BILITOT 1.8* 2.0* 1.3* 1.2 1.2  PROT 5.2* 4.9* 4.8* 5.0* 5.0*  ALBUMIN 2.2* 2.0* 1.9* 1.9* 1.8*   No results for input(s): LIPASE, AMYLASE in the last 168 hours. No results for input(s): AMMONIA in the last 168 hours. CBC: Recent Labs  Lab 10/29/21 0209 10/30/21 0127 10/31/21 0145 11/01/21 0056 11/02/21 0252 11/03/21 0404 11/04/21 0418  WBC 13.7* 15.3* 14.4* 15.5* 11.8* 10.2 8.6  NEUTROABS 10.0* 11.7* 11.2* 12.7*  --   --   --   HGB 10.1* 10.0* 9.3* 8.9* 8.4* 8.4* 8.4*  HCT 31.0* 30.7* 28.5* 28.6* 27.1* 26.9* 26.5*  MCV 86.4 88.0 88.5 90.8 90.3 90.6 90.8  PLT 124* 148* 184 218 270 316 370   Cardiac Enzymes: No results for input(s): CKTOTAL, CKMB, CKMBINDEX, TROPONINI in the last 168 hours. BNP: Invalid input(s): POCBNP CBG: Recent Labs  Lab 11/03/21 1332 11/03/21 1638 11/03/21 2118 11/04/21 0819 11/04/21 1211  GLUCAP 180* 110* 144* 160* 143*   D-Dimer No results for input(s): DDIMER in the last 72 hours. Hgb A1c No results for input(s): HGBA1C in the last 72 hours. Lipid Profile No results for input(s): CHOL, HDL, LDLCALC, TRIG, CHOLHDL, LDLDIRECT in the last 72 hours. Thyroid function studies No results for input(s): TSH, T4TOTAL, T3FREE, THYROIDAB in the last 72 hours.  Invalid input(s): FREET3 Anemia work up No results for input(s): VITAMINB12, FOLATE, FERRITIN, TIBC, IRON, RETICCTPCT in the last 72 hours. Urinalysis    Component Value Date/Time   COLORURINE AMBER (A) 10/31/2021 1205   APPEARANCEUR CLEAR 10/31/2021 1205   LABSPEC 1.021 10/31/2021 1205   PHURINE 5.0 10/31/2021 1205   GLUCOSEU NEGATIVE  10/31/2021 1205   HGBUR NEGATIVE 10/31/2021 1205   [place] 10/31/2021 1205   BILIRUBINUR negative 11/05/2017 1457   KETONESUR NEGATIVE 10/31/2021 1205   PROTEINUR NEGATIVE 10/31/2021 1205   UROBILINOGEN 1.0 11/05/2017 1457   UROBILINOGEN 1.0 06/24/2013 2159   NITRITE NEGATIVE 10/31/2021 1205   LEUKOCYTESUR NEGATIVE 10/31/2021 1205   Sepsis Labs Invalid input(s): PROCALCITONIN,  WBC,  LACTICIDVEN Microbiology Recent Results (from the past 240 hour(s))  Urine Culture     Status: None   Collection Time: 10/31/21 11:23 AM   Specimen: Urine, Clean Catch  Result Value Ref Range Status   Specimen Description URINE, CLEAN CATCH  Final   Special Requests NONE  Final   Culture   Final    NO GROWTH Performed at [facility], [place] [address], [place], [place] [address]    Report Status 11/01/2021 FINAL  Final  Resp Panel by RT-PCR (Flu A&B, Covid) Nasopharyngeal Swab     Status: None   Collection  Time: 10/31/21 12:53 PM   Specimen: Nasopharyngeal Swab; Nasopharyngeal(NP) swabs in vial transport medium  Result Value Ref Range Status   SARS Coronavirus 2 by RT PCR NEGATIVE NEGATIVE Final    Comment: (NOTE) SARS-CoV-2 target nucleic acids are NOT DETECTED.  The SARS-CoV-2 RNA is generally detectable in upper respiratory specimens during the acute phase of infection. The lowest concentration of SARS-CoV-2 viral copies this assay can detect is 138 copies/mL. A negative result does not preclude SARS-Cov-2 infection and should not be used as the sole basis for treatment or other patient management decisions. A negative result may occur with  improper specimen collection/handling, submission of specimen other than nasopharyngeal swab, presence of viral mutation(s) within the areas targeted by this assay, and inadequate number of viral copies(<138 copies/mL). A negative result must be combined with clinical observations, patient history, and epidemiological information. The  expected result is Negative.  Fact Sheet for Patients:  EntrepreneurPulse.com.au  Fact Sheet for Healthcare Providers:  IncredibleEmployment.be  This test is no t yet approved or cleared by the [place] FDA and  has been authorized for detection and/or diagnosis of SARS-CoV-2 by FDA under an Emergency Use Authorization (EUA). This EUA will remain  in effect (meaning this test can be used) for the duration of the COVID-19 declaration under Section 564(b)(1) of the Act, 21 U.S.C.section 360bbb-3(b)(1), unless the authorization is terminated  or revoked sooner.       Influenza A by PCR NEGATIVE NEGATIVE Final   Influenza B by PCR NEGATIVE NEGATIVE Final    Comment: (NOTE) The Xpert Xpress SARS-CoV-2/FLU/RSV plus assay is intended as an aid in the diagnosis of influenza from Nasopharyngeal swab specimens and should not be used as a sole basis for treatment. Nasal washings and aspirates are unacceptable for Xpert Xpress SARS-CoV-2/FLU/RSV testing.  Fact Sheet for Patients: EntrepreneurPulse.com.au  Fact Sheet for Healthcare Providers: IncredibleEmployment.be  This test is not yet approved or cleared by the [place] FDA and has been authorized for detection and/or diagnosis of SARS-CoV-2 by FDA under an Emergency Use Authorization (EUA). This EUA will remain in effect (meaning this test can be used) for the duration of the COVID-19 declaration under Section 564(b)(1) of the Act, 21 U.S.C. section 360bbb-3(b)(1), unless the authorization is terminated or revoked.  Performed at [facility], [place] [address], [place], [place] [address]   Culture, blood (routine x 2)     Status: None (Preliminary result)   Collection Time: 10/31/21  1:56 PM   Specimen: BLOOD RIGHT HAND  Result Value Ref Range Status   Specimen Description BLOOD RIGHT HAND  Final   Special Requests   Final    BOTTLES DRAWN  AEROBIC AND ANAEROBIC Blood Culture adequate volume   Culture   Final    NO GROWTH 4 DAYS Performed at [facility], [place] [address], [place], [place] [address]    Report Status PENDING  Incomplete  Culture, blood (routine x 2)     Status: None (Preliminary result)   Collection Time: 10/31/21  2:03 PM   Specimen: BLOOD RIGHT WRIST  Result Value Ref Range Status   Specimen Description BLOOD RIGHT WRIST  Final   Special Requests   Final    BOTTLES DRAWN AEROBIC AND ANAEROBIC Blood Culture adequate volume   Culture   Final    NO GROWTH 4 DAYS Performed at [facility], [place] [address], [place], [place] [address]    Report Status PENDING  Incomplete  Phillips Climes, MD  [facility] 11/04/2021, 12:52 PM Pager   If 7PM-7AM, please contact night-coverage [email or web address] Password [email or web address]

## 2021-11-04 NOTE — Progress Notes (Signed)
\                                                                   PROGRESS NOTE                                                                                                                                                                                                             Patient Demographics:    [name] Young, is a 81 y.o. male, DOB - [birth year], ZDG:[id number]  Outpatient Primary MD for the patient is Ria Bush, MD    LOS - 12  Admit date - 10/23/2021    Chief Complaint  Patient presents with   Abdominal Pain       Brief Narrative (HPI from H&P)       81 year old gentleman with a history of coronary artery disease status post CABG 2019, paroxysmal atrial fibrillation, diabetes, GERD, CKD who presented with abdominal pain, found to have ascending cholangitis and bacteremia, he was kept in ICU was seen by GI underwent ERCP with gallstone extraction, he was also seen by ID for bacteremia.  As well work-up significant for endocarditis.   Subjective:   Overnight, no further diarrhea, no chest pain, no shortness of breath, sitting in a chair this morning.      Assessment  & Plan :   Sepsis and bacteremia with Streptococcus infantarius and E. Coli and now confirmed mitral valve endocarditis  - caused by ascending cholangitis, had gallstones was seen by GI and he underwent ERCP with 2 gallbladder stones removed with sphincterotomy on 10/23/2021, seen by ID currently on IV antibiotics, TTE was stable unfortunately TEE done on 10/28/2021 does confirm mitral valve endocarditis, case discussed with ID PICC line placed on 10/28/2021 for prolonged IV antibiotics.  Thankfully sepsis pathophysiology has resolved and CRP and procalcitonin are trending down. -PICC line placed 2/18 -Antibiotics management per ID, on IV Unasyn, on IV vancomycin for last 2 days, currently afebrile, currently changed to IV cefazolin.  IV vancomycin discontinued given negative cultures. -Recommendation for  IV cefazolin for total of 6 weeks with end date /12/05/2021.   AKI on CKD 3B.   - Seen by nephrology, AKI due to ATN from sepsis.  Improved after hydration and hemodynamic stability.  Creatinine peaked at 4.5 now down to  1.5.  Elevated LFT - due to above , trending down  Acute systolic CHF  - drop in systolic function of heart with EF dropping to 45% down from 55% few years ago.   - Likely due to sepsis.  Currently on beta-blocker, seen by cardiology he is started on [place]. -Appears to be euvolemic currently. -Started on Farxiga  Paroxysmal atrial fibrillation with [place] vas 2 score of greater than 3.   - On combination of Lopressor and full dose Lovenox which we will hold on 10/30/2021 due to PICC line site hematoma, resumed Eliquis on 10/31/2021.  Normocytic anemia/unspecified -due acute illness and sepsis   Severe of weakness and deconditioning.  PT OT and monitor.  Will require SNF.  Hypokalemia.  Replaced.  BPH.  On Flomax continue.  DM type II.  For now sliding scale.  A1c satisfactory at 6.1  CBG (last 3)  Recent Labs    11/03/21 2118 11/04/21 0819 11/04/21 1211  GLUCAP 144* 160* 143*   Obesity with BMI of 38.9      Condition - Extremely Guarded  Family Communication  :  D/W wife at bedside, D/W daughter in [place] as well.  Code Status :  DNR  Consults  :  PCCM, ID, Cards, Pall. Care  PUD Prophylaxis :    Procedures  :     CT Head - non acute  ERCP with removal of 2 gallbladder stones and sphincterotomy on 10/23/2021.    CT Abd & Pelvis - Chest -  1. No evidence of acute aortic syndrome. 2. No acute findings in the thorax to account for the patient's symptoms. 3. 5 mm calculus at the right ureteropelvic junction. At this time, there is no proximal hydronephrosis to indicate urinary tract obstruction. 4. Cholelithiasis without evidence of acute cholecystitis. 5. Dilatation of the common bile duct. No intrahepatic biliary ductal  dilatation to clearly indicate biliary tract obstruction. Additionally, there is no calcified choledocholithiasis. This is of uncertain etiology and significance, and could be age related, however, correlation with liver function tests is recommended. If there is clinical concern for biliary tract obstruction, further evaluation with abdominal MRI with and without IV gadolinium with MRCP would be recommended. 6. Aortic atherosclerosis, in addition to left main and three-vessel coronary artery disease. Status post median sternotomy for CABG including LIMA to the LAD. 7. There are calcifications of the aortic valve. Echocardiographic correlation for evaluation of potential valvular dysfunction may be warranted if clinically indicated. 8. Additional incidental findings, as above.  TTE -  EF 45% with global hypokinesis.  TEE -   Suspected endocarditis of the mitral valve with increased echogenicty, thickening and hockystick motion as well as a small mobile vegetation. There is mild MR. Findings are new compared to TEE in 2019 which was personally reviewed. Dilated ascending aorta to 38 mm. Thickening of the aortic valve leaflets without mobile vegetation. LVEF 40-45%, global hypokinesis. Pre-op EKG demonstrated the patient was back in sinus rhythm with PVC's, therefore, cardioversion was not pursued.      Disposition Plan  :    Status is: Inpatient  DVT Prophylaxis  :  on Eliquis  SCDs Start: 10/23/21 0909 apixaban (ELIQUIS) tablet 5 mg  Lab Results  Component Value Date   PLT 370 11/04/2021    Diet :  Diet Order             Diet regular Room service appropriate? Yes; Fluid consistency: Thin  Diet effective now  Inpatient Medications  Scheduled Meds:  apixaban  5 mg Oral BID   Chlorhexidine Gluconate Cloth  6 each Topical Q0600   dapagliflozin propanediol  5 mg Oral Daily   fenofibrate  160 mg Oral Daily   insulin aspart  0-15 Units Subcutaneous TID  WC   metoprolol succinate  50 mg Oral Daily   potassium chloride  20 mEq Oral Daily   rosuvastatin  10 mg Oral Daily   sacubitril-valsartan  1 tablet Oral BID   sodium chloride flush  10-40 mL Intracatheter Q12H   tamsulosin  0.4 mg Oral Daily   Continuous Infusions:   ceFAZolin (ANCEF) IV 2 g (11/04/21 1303)   PRN Meds:.docusate sodium, hydrALAZINE, melatonin, ondansetron (ZOFRAN) IV, polyethylene glycol     Phillips Climes M.D on 11/04/2021 at 2:49 PM  To page go to [email or web address]   [facility] -  Office  [phone]  See all Orders from today for further details    Objective:   Vitals:   11/04/21 0600 11/04/21 0700 11/04/21 0800 11/04/21 1209  BP:   113/62 (!) 135/55  Pulse: 68 68 75 76  Resp: 20 (!) 23 16 20   Temp:   98.1 F (36.7 C) 97.6 F (36.4 C)  TempSrc:   Oral Oral  SpO2: 93% 90% 95% 97%  Weight:      Height:        Wt Readings from Last 3 Encounters:  11/03/21 126.6 kg  06/07/21 118 kg  01/28/21 118.8 kg     Intake/Output Summary (Last 24 hours) at 11/04/2021 1449 Last data filed at 11/04/2021 8101 Gross per 24 hour  Intake --  Output 3100 ml  Net -3100 ml     Physical Exam  Awake Alert, Oriented X 3, No new F.N deficits, Normal affect Symmetrical Chest wall movement, Good air movement bilaterally, CTAB RRR,No Gallops,Rubs or new Murmurs, No Parasternal Heave +ve B.Sounds, Abd Soft, No tenderness, No rebound - guarding or rigidity. No Cyanosis, Clubbing or edema, No new Rash or bruise       Data Review:    CBC Recent Labs  Lab 10/29/21 0209 10/30/21 0127 10/31/21 0145 11/01/21 0056 11/02/21 0252 11/03/21 0404 11/04/21 0418  WBC 13.7* 15.3* 14.4* 15.5* 11.8* 10.2 8.6  HGB 10.1* 10.0* 9.3* 8.9* 8.4* 8.4* 8.4*  HCT 31.0* 30.7* 28.5* 28.6* 27.1* 26.9* 26.5*  PLT 124* 148* 184 218 270 316 370  MCV 86.4 88.0 88.5 90.8 90.3 90.6 90.8  MCH 28.1 28.7 28.9 28.3 28.0 28.3 28.8  MCHC 32.6 32.6 32.6 31.1 31.0 31.2 31.7  RDW  16.5* 16.9* 17.1* 17.2* 17.2* 17.1* 16.6*  LYMPHSABS 1.6 1.9 1.8 1.5  --   --   --   MONOABS 1.1* 0.9 0.8 0.7  --   --   --   EOSABS 0.2 0.2 0.2 0.2  --   --   --   BASOSABS 0.1 0.0 0.0 0.0  --   --   --     Electrolytes Recent Labs  Lab 10/29/21 0209 10/30/21 0127 10/31/21 0145 11/01/21 0056 11/02/21 0252 11/04/21 0418  NA 141 138 138 138 136 135  K 3.5 3.6 3.6 3.6 3.9 3.9  CL 107 105 105 106 105 105  CO2 24 23 25 25 22 23   GLUCOSE 174* 155* 161* 132* 123* 128*  BUN 35* 25* 22 20 15 11   CREATININE 1.14 1.08 1.11 1.18 1.11 1.14  CALCIUM 7.8* 7.6* 7.7* 7.7* 7.6* 8.2*  AST 32 27 24 64*  43*  --   ALT 104* 63* 46* 51* 40  --   ALKPHOS 97 85 79 77 74  --   BILITOT 1.8* 2.0* 1.3* 1.2 1.2  --   ALBUMIN 2.2* 2.0* 1.9* 1.9* 1.8*  --   MG 1.9 1.7 2.0 2.0  --   --   CRP 16.7* 14.2* 14.5* 15.7*  --   --   PROCALCITON 5.90 3.48 2.26 3.17 1.69  --   BNP 727.2* 394.5*  --   --   --   --     ------------------------------------------------------------------------------------------------------------------ No results for input(s): CHOL, HDL, LDLCALC, TRIG, CHOLHDL, LDLDIRECT in the last 72 hours.  Lab Results  Component Value Date   HGBA1C 6.1 (H) 10/23/2021    No results for input(s): TSH, T4TOTAL, T3FREE, THYROIDAB in the last 72 hours.  Invalid input(s): FREET3 ------------------------------------------------------------------------------------------------------------------ ID Labs Recent Labs  Lab 10/29/21 0209 10/30/21 0127 10/31/21 0145 11/01/21 0056 11/02/21 0252 11/03/21 0404 11/04/21 0418  WBC 13.7* 15.3* 14.4* 15.5* 11.8* 10.2 8.6  PLT 124* 148* 184 218 270 316 370  CRP 16.7* 14.2* 14.5* 15.7*  --   --   --   PROCALCITON 5.90 3.48 2.26 3.17 1.69  --   --   CREATININE 1.14 1.08 1.11 1.18 1.11  --  1.14   Cardiac Enzymes No results for input(s): CKMB, TROPONINI, MYOGLOBIN in the last 168 hours.  Invalid input(s): CK   Radiology Reports No results found.

## 2021-11-05 DIAGNOSIS — M6281 Muscle weakness (generalized): Secondary | ICD-10-CM | POA: Diagnosis not present

## 2021-11-05 DIAGNOSIS — B372 Candidiasis of skin and nail: Secondary | ICD-10-CM | POA: Diagnosis not present

## 2021-11-05 DIAGNOSIS — B9620 Unspecified Escherichia coli [E. coli] as the cause of diseases classified elsewhere: Secondary | ICD-10-CM | POA: Diagnosis not present

## 2021-11-05 DIAGNOSIS — I330 Acute and subacute infective endocarditis: Secondary | ICD-10-CM | POA: Diagnosis not present

## 2021-11-05 DIAGNOSIS — R5383 Other fatigue: Secondary | ICD-10-CM | POA: Diagnosis not present

## 2021-11-05 DIAGNOSIS — R17 Unspecified jaundice: Secondary | ICD-10-CM | POA: Diagnosis not present

## 2021-11-05 DIAGNOSIS — N179 Acute kidney failure, unspecified: Secondary | ICD-10-CM | POA: Diagnosis not present

## 2021-11-05 DIAGNOSIS — Z66 Do not resuscitate: Secondary | ICD-10-CM | POA: Diagnosis not present

## 2021-11-05 DIAGNOSIS — K8309 Other cholangitis: Secondary | ICD-10-CM | POA: Diagnosis not present

## 2021-11-05 DIAGNOSIS — I2510 Atherosclerotic heart disease of native coronary artery without angina pectoris: Secondary | ICD-10-CM | POA: Diagnosis not present

## 2021-11-05 DIAGNOSIS — N183 Chronic kidney disease, stage 3 unspecified: Secondary | ICD-10-CM | POA: Diagnosis not present

## 2021-11-05 DIAGNOSIS — B955 Unspecified streptococcus as the cause of diseases classified elsewhere: Secondary | ICD-10-CM | POA: Diagnosis not present

## 2021-11-05 DIAGNOSIS — I5032 Chronic diastolic (congestive) heart failure: Secondary | ICD-10-CM | POA: Diagnosis not present

## 2021-11-05 DIAGNOSIS — R6521 Severe sepsis with septic shock: Secondary | ICD-10-CM | POA: Diagnosis not present

## 2021-11-05 DIAGNOSIS — R278 Other lack of coordination: Secondary | ICD-10-CM | POA: Diagnosis not present

## 2021-11-05 DIAGNOSIS — I10 Essential (primary) hypertension: Secondary | ICD-10-CM | POA: Diagnosis not present

## 2021-11-05 DIAGNOSIS — I4891 Unspecified atrial fibrillation: Secondary | ICD-10-CM | POA: Diagnosis not present

## 2021-11-05 DIAGNOSIS — R6520 Severe sepsis without septic shock: Secondary | ICD-10-CM | POA: Diagnosis not present

## 2021-11-05 DIAGNOSIS — K8033 Calculus of bile duct with acute cholangitis with obstruction: Secondary | ICD-10-CM | POA: Diagnosis not present

## 2021-11-05 DIAGNOSIS — R7881 Bacteremia: Secondary | ICD-10-CM | POA: Diagnosis not present

## 2021-11-05 DIAGNOSIS — I482 Chronic atrial fibrillation, unspecified: Secondary | ICD-10-CM | POA: Diagnosis not present

## 2021-11-05 DIAGNOSIS — E782 Mixed hyperlipidemia: Secondary | ICD-10-CM | POA: Diagnosis not present

## 2021-11-05 DIAGNOSIS — Z7189 Other specified counseling: Secondary | ICD-10-CM | POA: Diagnosis not present

## 2021-11-05 DIAGNOSIS — I959 Hypotension, unspecified: Secondary | ICD-10-CM | POA: Diagnosis not present

## 2021-11-05 DIAGNOSIS — K219 Gastro-esophageal reflux disease without esophagitis: Secondary | ICD-10-CM | POA: Diagnosis not present

## 2021-11-05 DIAGNOSIS — E1122 Type 2 diabetes mellitus with diabetic chronic kidney disease: Secondary | ICD-10-CM | POA: Diagnosis not present

## 2021-11-05 DIAGNOSIS — R195 Other fecal abnormalities: Secondary | ICD-10-CM | POA: Diagnosis not present

## 2021-11-05 DIAGNOSIS — D649 Anemia, unspecified: Secondary | ICD-10-CM | POA: Diagnosis not present

## 2021-11-05 DIAGNOSIS — D419 Neoplasm of uncertain behavior of unspecified urinary organ: Secondary | ICD-10-CM | POA: Diagnosis not present

## 2021-11-05 DIAGNOSIS — A419 Sepsis, unspecified organism: Secondary | ICD-10-CM | POA: Diagnosis not present

## 2021-11-05 DIAGNOSIS — Z515 Encounter for palliative care: Secondary | ICD-10-CM | POA: Diagnosis not present

## 2021-11-05 DIAGNOSIS — R2689 Other abnormalities of gait and mobility: Secondary | ICD-10-CM | POA: Diagnosis not present

## 2021-11-05 DIAGNOSIS — Z7401 Bed confinement status: Secondary | ICD-10-CM | POA: Diagnosis not present

## 2021-11-05 LAB — CULTURE, BLOOD (ROUTINE X 2)
Culture: NO GROWTH
Culture: NO GROWTH
Special Requests: ADEQUATE
Special Requests: ADEQUATE

## 2021-11-05 LAB — GLUCOSE, CAPILLARY
Glucose-Capillary: 142 mg/dL — ABNORMAL HIGH (ref 70–99)
Glucose-Capillary: 143 mg/dL — ABNORMAL HIGH (ref 70–99)
Glucose-Capillary: 138 mg/dL — ABNORMAL HIGH (ref 70–99)

## 2021-11-05 NOTE — TOC Progression Note (Signed)
Transition of Care (TOC) - Progression Note    Patient Details  Name: [name] Young. MRN: [id number] Date of Birth: 01-07-1941  Transition of Care [facility]) CM/SW Contact  [address], [place], [place] Phone Number: 11/05/2021, 11:53 AM  Clinical Narrative:    Patient to discharge today to [facility]. Patient will be going to room 1202. RN to call in report to [phone]. Transport scheduled through [employer] for a 5pm transport time.  [address] LCSW Transition of Care [phone]    Expected Discharge Plan: [place] Barriers to Discharge: Continued Medical Work up  Expected Discharge Plan and Services Expected Discharge Plan: [place] In-house Referral: Clinical Social Work   Post Acute Care Choice:  (TBD) Living arrangements for the past 2 months: Single Family Home Expected Discharge Date: 11/05/21                                     Social Determinants of Health (SDOH) Interventions    Readmission Risk Interventions No flowsheet data found.

## 2021-11-05 NOTE — Discharge Summary (Signed)
Physician Discharge Summary  [name] Young. ZSW:[id number] DOB: [birth year] DOA: 10/23/2021  PCP: Ria Bush, MD  Admit date: 10/23/2021 Discharge date: 11/05/2021  Admitted From: Home Disposition:  SNF  Recommendations for Outpatient Follow-up:  Please check CBC, CMP in 3 days Please ensure patient keep the follow-up appointments with ID and cardiology as they have been scheduled .  OPAT Orders recommendations per ID. Discharge antibiotics to be given via PICC line Discharge antibiotics: Per pharmacy protocol cefazolin   Duration: 6 weeks End Date: 12/05/2021   [facility] Care Per Protocol:   Home health RN for IV administration and teaching; PICC line care and labs.     Labs weekly while on IV antibiotics: _xx_ CBC with differential _xx_ BMP __ CMP __ CRP __ ESR __ Vancomycin trough __ CK   _xx_ Please pull PIC at completion of IV antibiotics __ Please leave PIC in place until doctor has seen patient or been notified   Fax weekly labs to [phone]   Clinic Follow Up Appt: 11/17/21 @ 11:15am with Dr Juleen China   Discharge Condition:Stable CODE STATUS:DNR Diet recommendation: Heart Healthy   Brief/Interim Summary:  81 year old gentleman with a history of coronary artery disease status post CABG 2019, paroxysmal atrial fibrillation, diabetes, GERD, CKD who presented with abdominal pain, found to have ascending cholangitis and bacteremia, he was kept in ICU was seen by GI underwent ERCP with gallstone extraction, he was also seen by ID for bacteremia.  As well work-up significant for endocarditis.  Sepsis and bacteremia with Streptococcus infantarius and E. Coli and now confirmed mitral valve endocarditis  - caused by ascending cholangitis, had gallstones was seen by GI and he underwent ERCP with 2 gallbladder stones removed with sphincterotomy on 10/23/2021, seen by ID currently on IV antibiotics, TTE was stable unfortunately TEE done on 10/28/2021 does confirm  mitral valve endocarditis, case discussed with ID PICC line placed on 10/28/2021 for prolonged IV antibiotics.  Thankfully sepsis pathophysiology has resolved and CRP and procalcitonin are trending down. -PICC line placed 2/18 -Antibiotics management per ID, antibiotics narrowed to IV cefazolin.  -Recommendation for IV cefazolin for total of 6 weeks with end date /12/05/2021. -To follow-up with ID on 3/9, and with cardiology on 3/8     AKI on CKD 3B.   - Seen by nephrology, AKI due to ATN from sepsis.  Improved after hydration and hemodynamic stability.  Creatinine peaked at 4.5 now down to 1.14   Elevated LFT - due to above , trending down   Acute systolic CHF  - drop in systolic function of heart with EF dropping to 45% down from 55% few years ago.   - Likely due to sepsis.  Currently on beta-blocker, seen by cardiology he is started on [place]. -Appears to be euvolemic currently. -Started on Farxiga   Paroxysmal atrial fibrillation with [place] vas 2 score of greater than 3.   - on Eliquis - on Metoprolol   Normocytic anemia/unspecified -due acute illness and sepsis     Severe of weakness and deconditioning.  PT OT .  Will require SNF.   Hypokalemia.  Replaced.   BPH.  On Flomax continue.   DM type II.  For now sliding scale.  A1c satisfactory at 6.1   CBG (last 3)  Recent Labs (last 2 labs)        Recent Labs    11/03/21 2118 11/04/21 0819 11/04/21 1211  GLUCAP 144* 160* 143*      Obesity with  BMI of 38.9  Discharge Diagnoses:  Principal Problem:   Bacteremia due to Streptococcus Active Problems:   Coronary artery disease   Atrial fibrillation (HCC)   Sepsis (HCC)   Calculus of bile duct with cholangitis and obstruction   Jaundice   Elevated LFTs   Hyperbilirubinemia   Bacteremia due to Escherichia coli   Palliative care by specialist    Discharge Instructions  Discharge Instructions     [facility] pharmacist to adjust dose  for Vancomycin, Aminoglycosides and other anti-infective therapies as requested by physician.   Complete by: As directed    [facility] to provide Cath Flo 381m   Complete by: As directed    Administer for PICC line occlusion and as ordered by physician for other access device issues.   Anaphylaxis Kit: Provided to treat any anaphylactic reaction to the medication being provided to the patient if First Dose or when requested by physician   Complete by: As directed    Epinephrine 1681mml vial / amp: Administer 0.81m47m0.81ml4mubcutaneously once for moderate to severe anaphylaxis, nurse to call physician and pharmacy when reaction occurs and call 911 if needed for immediate care   Diphenhydramine 50mg49mIV vial: Administer 25-50mg 48mM PRN for first dose reaction, rash, itching, mild reaction, nurse to call physician and pharmacy when reaction occurs   Sodium Chloride 0.9% NS 500ml I37mdminister if needed for hypovolemic blood pressure drop or as ordered by physician after call to physician with anaphylactic reaction   Change dressing on IV access line weekly and PRN   Complete by: As directed    Diet - low sodium heart healthy   Complete by: As directed    Discharge instructions   Complete by: As directed    Follow with Primary MD GutierrRia BushNF physician  Get CBC, CMP, checked  by Primary MD next visit.    Activity: As tolerated with Full fall precautions use walker/cane & assistance as needed   Disposition SNF   Diet: Regular Diet   On your next visit with your primary care physician please Get Medicines reviewed and adjusted.   Please request your Prim.MD to go over all Hospital Tests and Procedure/Radiological results at the follow up, please get all Hospital records sent to your Prim MD by signing hospital release before you go home.   If you experience worsening of your admission symptoms, develop shortness of breath, life threatening emergency,  suicidal or homicidal thoughts you must seek medical attention immediately by calling 911 or calling your MD immediately  if symptoms less severe.  You Must read complete instructions/literature along with all the possible adverse reactions/side effects for all the Medicines you take and that have been prescribed to you. Take any new Medicines after you have completely understood and accpet all the possible adverse reactions/side effects.   Do not drive, operating heavy machinery, perform activities at heights, swimming or participation in water activities or provide baby sitting services if your were admitted for syncope or siezures until you have seen by Primary MD or a Neurologist and advised to do so again.  Do not drive when taking Pain medications.    Do not take more than prescribed Pain, Sleep and Anxiety Medications  Special Instructions: If you have smoked or chewed Tobacco  in the last 2 yrs please stop smoking, stop any regular Alcohol  and or any Recreational drug use.  Wear Seat belts while driving.   Please note  You were  cared for by a hospitalist during your hospital stay. If you have any questions about your discharge medications or the care you received while you were in the hospital after you are discharged, you can call the unit and asked to speak with the hospitalist on call if the hospitalist that took care of you is not available. Once you are discharged, your primary care physician will handle any further medical issues. Please note that NO REFILLS for any discharge medications will be authorized once you are discharged, as it is imperative that you return to your primary care physician (or establish a relationship with a primary care physician if you do not have one) for your aftercare needs so that they can reassess your need for medications and monitor your lab values.   Flush IV access with Sodium Chloride 0.9% and Heparin 10 units/ml or 100 units/ml   Complete by: As  directed    Home infusion instructions - [facility]   Complete by: As directed    Instructions: Flush IV access with Sodium Chloride 0.9% and Heparin 10units/ml or 100units/ml   Change dressing on IV access line: Weekly and PRN   Instructions Cath Flo 23m: Administer for PICC Line occlusion and as ordered by physician for other access device   [facility] pharmacist to adjust dose for: Vancomycin, Aminoglycosides and other anti-infective therapies as requested by physician   Increase activity slowly   Complete by: As directed    Method of administration may be changed at the discretion of home infusion pharmacist based upon assessment of the patient and/or caregivers ability to self-administer the medication ordered   Complete by: As directed    No wound care   Complete by: As directed       Allergies as of 11/05/2021   No Known Allergies      Medication List     STOP taking these medications    aspirin EC 81 MG tablet   lisinopril 40 MG tablet Commonly known as: ZESTRIL   metFORMIN 500 MG tablet Commonly known as: GLUCOPHAGE   sitaGLIPtin 50 MG tablet Commonly known as: Januvia       TAKE these medications    Accu-Chek Aviva Plus test strip Generic drug: glucose blood USE AS DIRECTED TO CHECK BLOOD SUGARS UPTO FOUR TIMES DAILY.   apixaban 5 MG Tabs tablet Commonly known as: ELIQUIS Take 1 tablet (5 mg total) by mouth 2 (two) times daily.   B-12 1000 MCG Subl Place 1 tablet under the tongue daily.   blood glucose meter kit and supplies Dispense based on patient and insurance preference. Use up to four times daily as directed. (FOR ICD-10 E10.9, E11.9).   ceFAZolin  IVPB Commonly known as: ANCEF Inject 2 g into the vein every 8 (eight) hours. Indication:  MV endocarditis First Dose: Yes Last Day of Therapy:  12/05/21 Labs - Once weekly:  CBC/D and BMP, Labs - Every other week:  ESR and CRP Method of administration: IV Push Method  of administration may be changed at the discretion of home infusion pharmacist based upon assessment of the patient and/or caregiver's ability to self-administer the medication ordered.   dapagliflozin propanediol 5 MG Tabs tablet Commonly known as: FARXIGA Take 1 tablet (5 mg total) by mouth daily.   fenofibrate 145 MG tablet Commonly known as: TRICOR Take 1 tablet (145 mg total) by mouth daily.   Fish Oil 1200 MG Caps Take 2 capsules (2,400 mg total) by mouth daily.  insulin aspart 100 UNIT/ML injection Commonly known as: novoLOG Inject 0-15 Units into the skin 3 (three) times daily with meals.   MACULAR VITAMIN BENEFIT PO Take 1 tablet by mouth daily.   metoprolol succinate 50 MG 24 hr tablet Commonly known as: TOPROL-XL Take 1 tablet (50 mg total) by mouth daily. Take with or immediately following a meal. What changed:  medication strength how much to take how to take this when to take this additional instructions   nitroGLYCERIN 0.4 MG SL tablet Commonly known as: Nitrostat Place 1 tablet (0.4 mg total) under the tongue every 5 (five) minutes as needed for chest pain.   omeprazole 40 MG capsule Commonly known as: PRILOSEC Take 1 capsule (40 mg total) by mouth every Monday, Wednesday, and Friday.   rosuvastatin 10 MG tablet Commonly known as: CRESTOR TAKE 1 TABLET BY MOUTH ONCE A DAY   sacubitril-valsartan 24-26 MG Commonly known as: ENTRESTO Take 1 tablet by mouth 2 (two) times daily.   tamsulosin 0.4 MG Caps capsule Commonly known as: FLOMAX Take 1 capsule (0.4 mg total) by mouth daily.   Vitamin D3 25 MCG (1000 UT) Caps Take 1 capsule (1,000 Units total) by mouth daily.               Discharge Care Instructions  (From admission, onward)           Start     Ordered   11/04/21 0000  Change dressing on IV access line weekly and PRN  (Home infusion instructions - [facility] )        11/04/21 1246            Contact  information for follow-up providers     End, Harrell Gave, MD Follow up on 11/16/2021.   Specialty: Cardiology Why: Appointment at 9:20 AM Contact information: [place] [id number] [phone]         Mignon Pine, DO. Go on 11/17/2021.   Specialties: Infectious Diseases, Internal Medicine Why: 11/17/21 @ 11:15am with Dr Therisa Doyne information: [address] [place] [address] [phone]              Contact information for after-discharge care     Destination     [facility]erred [facility] .   Service: Skilled Nursing Contact information: [address] [place] [phone]                    No Known Allergies  Consultations:  PCCM, ID, Cards, Pall. Care   Procedures/Studies: CT HEAD WO CONTRAST (5MM)  Result Date: 10/23/2021 CLINICAL DATA:  Delirium EXAM: CT HEAD WITHOUT CONTRAST TECHNIQUE: Contiguous axial images were obtained from the base of the skull through the vertex without intravenous contrast. RADIATION DOSE REDUCTION: This exam was performed according to the departmental dose-optimization program which includes automated exposure control, adjustment of the mA and/or kV according to patient size and/or use of iterative reconstruction technique. COMPARISON:  None. FINDINGS: Brain: No evidence of acute infarction, hemorrhage, hydrocephalus, extra-axial collection or mass lesion/mass effect. Mild cerebral volume loss and white matter low-density in keeping with age. Vascular: No hyperdense vessel or unexpected calcification. Skull: Normal. Negative for fracture or focal lesion. Sinuses/Orbits: No acute finding. IMPRESSION: No acute finding.  Unremarkable study for age. Electronically Signed   By: Jorje Guild M.D.   On: 10/23/2021 07:15   DG Chest Port 1 View  Result Date: 10/31/2021  CLINICAL DATA:  Shortness of breath in a 81 year old male. EXAM:  PORTABLE CHEST 1 VIEW COMPARISON:  Comparison with October 28, 2021. FINDINGS: EKG leads project over the chest. Study limited by patient condition by report. Median sternotomy changes and changes of coronary revascularization. Heart size is stable. No lobar consolidative process. Minimal LEFT basilar airspace disease with slightly improved aeration in the chest compared to previous imaging. No visible pneumothorax. On limited assessment there is no acute skeletal finding. IMPRESSION: Minimal LEFT basilar airspace disease with slightly improved aeration in the chest compared to previous imaging. No other interval change. Electronically Signed   By: Zetta Bills M.D.   On: 10/31/2021 11:52   DG Chest Port 1 View  Result Date: 10/28/2021 CLINICAL DATA:  Shortness of breath EXAM: PORTABLE CHEST 1 VIEW COMPARISON:  Radiograph 10/23/2021 FINDINGS: Unchanged enlarged cardiac silhouette. Prior median sternotomy and CABG. There are left basilar opacities. Small bilateral pleural effusions, left greater than right. No visible pneumothorax. No acute osseous abnormality. IMPRESSION: Small bilateral pleural effusions, left greater than right. Left basilar opacities could be atelectasis or infection. Unchanged cardiomegaly. Electronically Signed   By: Maurine Simmering M.D.   On: 10/28/2021 11:58   DG Chest Portable 1 View  Result Date: 10/23/2021 CLINICAL DATA:  AFib with RVR EXAM: PORTABLE CHEST 1 VIEW COMPARISON:  03/06/2018 FINDINGS: Lungs are clear.  No pleural effusion or pneumothorax. Stable mild cardiomegaly. Postsurgical changes related to prior CABG. Median sternotomy. IMPRESSION: Stable mild cardiomegaly. No evidence of acute cardiopulmonary disease. Electronically Signed   By: Julian Hy M.D.   On: 10/23/2021 03:55   DG ERCP WITH SPHINCTEROTOMY  Result Date: 10/23/2021 CLINICAL DATA:  Chest and back pain Cholelithiasis Dilated CBD on CT EXAM: ERCP TECHNIQUE: Multiple spot images obtained with the  fluoroscopic device and submitted for interpretation post-procedure. FLUOROSCOPY: Radiation Exposure Index (as provided by the fluoroscopic device): 65.5 mGy Kerma COMPARISON:  None. FINDINGS: Thirteen images were submitted for interpretation. The submitted images demonstrate cannulation of the common bile duct. A filling defect is seen in the mid duct on the initial images which is no longer identified following balloon sweep. IMPRESSION: Intraoperative fluoroscopic images of ERCP as above. These images were submitted for radiologic interpretation only. Please see the procedural report for the amount of contrast and the fluoroscopy time utilized. Electronically Signed   By: Miachel Roux M.D.   On: 10/23/2021 11:45   ECHOCARDIOGRAM COMPLETE  Result Date: 10/23/2021    ECHOCARDIOGRAM REPORT   Patient Name:   Eldredge Veldhuizen. Date of Exam: 10/23/2021 Medical Rec #:  [id number]            Height:       71.0 in Accession #:    [id number]           Weight:       230.0 lb Date of Birth:  [birth year]           BSA:          2.238 m Patient Age:    45 years             BP:           96/53 mmHg Patient Gender: M                    HR:           92 bpm. Exam Location:  Inpatient Procedure: 2D Echo Indications:    dyspnea  History:  Patient has prior history of Echocardiogram examinations, most                 recent 01/18/2018. Prior CABG, Sepsis. Chronic kidney disease;                 Risk Factors:Diabetes, Dyslipidemia and Hypertension.  Sonographer:    Johny Chess RDCS Referring Phys: [place]  1. Left ventricular ejection fraction, by estimation, is 40 to 45%. The left ventricle has mildly decreased function. The left ventricle demonstrates global hypokinesis. Left ventricular diastolic parameters are consistent with Grade II diastolic dysfunction (pseudonormalization). Elevated left ventricular end-diastolic pressure.  2. Right ventricular systolic function is normal. The right  ventricular size is normal. There is normal pulmonary artery systolic pressure. The estimated right ventricular systolic pressure is 54.2 mmHg.  3. Left atrial size was mildly dilated.  4. The mitral valve is degenerative. Trivial mitral valve regurgitation. No evidence of mitral stenosis.  5. The aortic valve is calcified. There is moderate calcification of the aortic valve. There is moderate thickening of the aortic valve. Aortic valve regurgitation is not visualized. Aortic valve sclerosis/calcification is present, without any evidence of aortic stenosis.  6. There is mild dilatation of the aortic root, measuring 41 mm. There is mild dilatation of the ascending aorta, measuring 38 mm.  7. The inferior vena cava is dilated in size with >50% respiratory variability, suggesting right atrial pressure of 8 mmHg. FINDINGS  Left Ventricle: Left ventricular ejection fraction, by estimation, is 40 to 45%. The left ventricle has mildly decreased function. The left ventricle demonstrates global hypokinesis. The left ventricular internal cavity size was normal in size. There is  no left ventricular hypertrophy. Left ventricular diastolic parameters are consistent with Grade II diastolic dysfunction (pseudonormalization). Elevated left ventricular end-diastolic pressure. Right Ventricle: The right ventricular size is normal. No increase in right ventricular wall thickness. Right ventricular systolic function is normal. There is normal pulmonary artery systolic pressure. The tricuspid regurgitant velocity is 2.02 m/s, and  with an assumed right atrial pressure of 8 mmHg, the estimated right ventricular systolic pressure is 70.6 mmHg. Left Atrium: Left atrial size was mildly dilated. Right Atrium: Right atrial size was normal in size. Pericardium: There is no evidence of pericardial effusion. Mitral Valve: The mitral valve is degenerative in appearance. There is moderate thickening of the mitral valve leaflet(s). There is  moderate calcification of the mitral valve leaflet(s). Mild mitral annular calcification. Trivial mitral valve regurgitation. No evidence of mitral valve stenosis. Tricuspid Valve: The tricuspid valve is normal in structure. Tricuspid valve regurgitation is trivial. No evidence of tricuspid stenosis. Aortic Valve: The aortic valve is calcified. There is moderate calcification of the aortic valve. There is moderate thickening of the aortic valve. Aortic valve regurgitation is not visualized. Aortic valve sclerosis/calcification is present, without any  evidence of aortic stenosis. Pulmonic Valve: The pulmonic valve was normal in structure. Pulmonic valve regurgitation is trivial. No evidence of pulmonic stenosis. Aorta: The aortic root is normal in size and structure. There is mild dilatation of the aortic root, measuring 41 mm. There is mild dilatation of the ascending aorta, measuring 38 mm. Venous: The inferior vena cava is dilated in size with greater than 50% respiratory variability, suggesting right atrial pressure of 8 mmHg. IAS/Shunts: No atrial level shunt detected by color flow Doppler.  LEFT VENTRICLE PLAX 2D LVIDd:         6.30 cm      Diastology LVIDs:  4.60 cm      LV e' medial:    5.87 cm/s LV PW:         1.10 cm      LV E/e' medial:  16.7 LV IVS:        1.00 cm      LV e' lateral:   7.29 cm/s LVOT diam:     2.60 cm      LV E/e' lateral: 13.5 LV SV:         73 LV SV Index:   33 LVOT Area:     5.31 cm  LV Volumes (MOD) LV vol d, MOD A2C: 115.0 ml LV vol s, MOD A2C: 75.8 ml LV SV MOD A2C:     39.2 ml RIGHT VENTRICLE            IVC RV S prime:     9.03 cm/s  IVC diam: 2.60 cm TAPSE (M-mode): 1.3 cm LEFT ATRIUM             Index        RIGHT ATRIUM           Index LA diam:        4.30 cm 1.92 cm/m   RA Area:     17.00 cm LA Vol (A2C):   87.2 ml 38.97 ml/m  RA Volume:   43.90 ml  19.62 ml/m LA Vol (A4C):   66.9 ml 29.90 ml/m LA Biplane Vol: 77.2 ml 34.50 ml/m  AORTIC VALVE LVOT Vmax:   78.30  cm/s LVOT Vmean:  52.600 cm/s LVOT VTI:    0.138 m  AORTA Ao Root diam: 4.10 cm Ao Asc diam:  3.80 cm MITRAL VALVE               TRICUSPID VALVE MV Area (PHT): 4.49 cm    TR Peak grad:   16.3 mmHg MV Decel Time: 169 msec    TR Vmax:        202.00 cm/s MV E velocity: 98.20 cm/s MV A velocity: 83.10 cm/s  SHUNTS MV E/A ratio:  1.18        Systemic VTI:  0.14 m                            Systemic Diam: 2.60 cm Fransico Him MD Electronically signed by Fransico Him MD Signature Date/Time: 10/23/2021/4:51:21 PM    Final    ECHO TEE  Result Date: 10/28/2021    TRANSESOPHOGEAL ECHO REPORT   Patient Name:   Dencil Cayson. Date of Exam: 10/28/2021 Medical Rec #:  [id number]            Height:       71.0 in Accession #:    [id number]           Weight:       264.6 lb Date of Birth:  [birth year]           BSA:          2.375 m Patient Age:    81 years             BP:           169/70 mmHg Patient Gender: M                    HR:           77 bpm. Exam Location:  Inpatient Procedure: Transesophageal Echo, Cardiac  Doppler and Color Doppler Indications:     Bacteremia  History:         Patient has prior history of Echocardiogram examinations, most                  recent 10/24/2021. CAD, Abnormal ECG and Prior CABG,                  Arrythmias:Atrial Fibrillation, Signs/Symptoms:Bacteremia; Risk                  Factors:Diabetes.  Sonographer:     Roseanna Rainbow RDCS Referring Phys:  [id number] [facility] Diagnosing Phys: Lyman Bishop MD PROCEDURE: After discussion of the risks and benefits of a TEE, an informed consent was obtained from the patient. The transesophogeal probe was passed without difficulty through the esophogus of the patient. Imaged were obtained with the patient in a supine position. Sedation performed by different physician. The patient was monitored while under deep sedation. Anesthestetic sedation was provided intravenously by Anesthesiology: 186m of Propofol. The patient developed no complications during  the procedure. IMPRESSIONS  1. Left ventricular ejection fraction, by estimation, is 40 to 45%. The left ventricle has mildly decreased function. The left ventricle demonstrates global hypokinesis.  2. Right ventricular systolic function is normal. The right ventricular size is normal.  3. No left atrial/left atrial appendage thrombus was detected.  4. Diffuse thickening and increased echogenicity of the anterior mitral leaflet with small mobile vegetation, consistent with endocarditis. The mitral valve is abnormal. Mild mitral valve regurgitation.  5. Small vegetation on the mitral valve.  6. No vegetation, Lambl's noted on the leaflet tips. The aortic valve is tricuspid. There is moderate thickening of the aortic valve. Aortic valve regurgitation is not visualized.  7. Aortic dilatation noted. There is borderline dilatation of the ascending aorta, measuring 38 mm. Conclusion(s)/Recommendation(s): Findings are concerning for vegetation/infective endocarditis as detailed above. FINDINGS  Left Ventricle: Left ventricular ejection fraction, by estimation, is 40 to 45%. The left ventricle has mildly decreased function. The left ventricle demonstrates global hypokinesis. The left ventricular internal cavity size was normal in size. Right Ventricle: The right ventricular size is normal. No increase in right ventricular wall thickness. Right ventricular systolic function is normal. Left Atrium: Left atrial size was normal in size. No left atrial/left atrial appendage thrombus was detected. Right Atrium: Right atrial size was normal in size. Pericardium: There is no evidence of pericardial effusion. Mitral Valve: Diffuse thickening and increased echogenicity of the anterior mitral leaflet with small mobile vegetation, consistent with endocarditis. The mitral valve is abnormal. A small vegetation is seen on the anterior mitral leaflet. The MV vegetation measures 5 mm x 5 mm. Mild mitral valve regurgitation. Tricuspid Valve:  The tricuspid valve is grossly normal. Tricuspid valve regurgitation is trivial. Aortic Valve: No vegetation, Lambl's noted on the leaflet tips. The aortic valve is tricuspid. There is moderate thickening of the aortic valve. Aortic valve regurgitation is not visualized. Pulmonic Valve: The pulmonic valve was normal in structure. Pulmonic valve regurgitation is not visualized. Aorta: Aortic dilatation noted. There is borderline dilatation of the ascending aorta, measuring 38 mm. IAS/Shunts: No atrial level shunt detected by color flow Doppler. KLyman BishopMD Electronically signed by KLyman BishopMD Signature Date/Time: 10/28/2021/2:25:50 PM    Final    CT Angio Chest/Abd/Pel for Dissection W and/or Wo Contrast  Result Date: 10/23/2021 CLINICAL DATA:  81year old male with history of chest and back pain. Suspected aortic dissection. EXAM: CT  ANGIOGRAPHY CHEST, ABDOMEN AND PELVIS TECHNIQUE: Non-contrast CT of the chest was initially obtained. Multidetector CT imaging through the chest, abdomen and pelvis was performed using the standard protocol during bolus administration of intravenous contrast. Multiplanar reconstructed images and MIPs were obtained and reviewed to evaluate the vascular anatomy. RADIATION DOSE REDUCTION: This exam was performed according to the departmental dose-optimization program which includes automated exposure control, adjustment of the mA and/or kV according to patient size and/or use of iterative reconstruction technique. CONTRAST:  63m OMNIPAQUE IOHEXOL 350 MG/ML SOLN COMPARISON:  Chest CT 01/17/2018. FINDINGS: CTA CHEST FINDINGS Cardiovascular: Precontrast images demonstrate no crescentic high attenuation associated with the wall of the thoracic aorta to suggest the presence of acute intramural hemorrhage. Postcontrast images demonstrate no evidence of thoracic aortic aneurysm or dissection. Heart size is normal. There is no significant pericardial fluid, thickening or pericardial  calcification. There is aortic atherosclerosis, as well as atherosclerosis of the great vessels of the mediastinum and the coronary arteries, including calcified atherosclerotic plaque in the left main, left anterior descending, left circumflex and right coronary arteries. Status post median sternotomy for CABG including LIMA to the LAD. Calcifications of the aortic valve. Mediastinum/Nodes: No pathologically enlarged mediastinal or hilar lymph nodes. Esophagus is unremarkable in appearance. No axillary lymphadenopathy. Lungs/Pleura: No acute consolidative airspace disease. No pleural effusions. No definite suspicious appearing pulmonary nodules or masses are noted on today's examination which is limited by considerable patient motion. Dependent areas of subsegmental atelectasis or scarring are noted in the lung bases bilaterally. Musculoskeletal: Median sternotomy wires. There are no aggressive appearing lytic or blastic lesions noted in the visualized portions of the skeleton. Review of the MIP images confirms the above findings. CTA ABDOMEN AND PELVIS FINDINGS VASCULAR Aorta: Normal caliber aorta without aneurysm, dissection, vasculitis or significant stenosis. Celiac: Patent without evidence of aneurysm, dissection, vasculitis or significant stenosis. SMA: Patent without evidence of aneurysm, dissection, vasculitis or significant stenosis. Renals: Both renal arteries are patent without evidence of aneurysm, dissection, vasculitis, fibromuscular dysplasia or significant stenosis. IMA: Patent without evidence of aneurysm, dissection, vasculitis or significant stenosis. Inflow: Patent without evidence of aneurysm, dissection, vasculitis or significant stenosis. Veins: No obvious venous abnormality within the limitations of this arterial phase study. Review of the MIP images confirms the above findings. NON-VASCULAR Hepatobiliary: No suspicious cystic or solid hepatic lesions are confidently identified on today's  arterial phase examination. Partially calcified gallstone measuring 1.1 cm in diameter lying dependently in the gallbladder. No findings to suggest an acute cholecystitis are noted at this time. Common bile duct measures 11 mm in the porta hepatis. No calcified stones are identified in the common bile duct. No intrahepatic biliary ductal dilatation. Pancreas: No pancreatic mass. No pancreatic ductal dilatation. No pancreatic or peripancreatic fluid collections or inflammatory changes. Spleen: Unremarkable. Adrenals/Urinary Tract: 5 mm calculus at the right ureteropelvic junction (axial image 191 of series 7). No proximal hydroureteronephrosis to indicate active obstruction at this time. There is extensive perinephric stranding bilaterally (nonspecific). Low-attenuation lesions in both kidneys, compatible with simple cysts, measuring up to 4.4 cm in the lower pole of the right kidney. Urinary bladder is normal in appearance. Bilateral adrenal glands are normal in appearance. Stomach/Bowel: Normal appearance of the stomach. No pathologic dilatation of small bowel or colon. Numerous colonic diverticulae are noted, particularly in the sigmoid colon, without definite focal surrounding inflammatory changes to clearly indicate an associated diverticulitis at this time. The appendix is not confidently identified and may be surgically absent. Regardless, there are  no inflammatory changes noted adjacent to the cecum to suggest the presence of an acute appendicitis at this time. Lymphatic: No lymphadenopathy noted in the abdomen or pelvis. Reproductive: Prostate gland and seminal vesicles are unremarkable in appearance. Other: No significant volume of ascites.  No pneumoperitoneum. Musculoskeletal: There are no aggressive appearing lytic or blastic lesions noted in the visualized portions of the skeleton. Review of the MIP images confirms the above findings. IMPRESSION: 1. No evidence of acute aortic syndrome. 2. No acute  findings in the thorax to account for the patient's symptoms. 3. 5 mm calculus at the right ureteropelvic junction. At this time, there is no proximal hydronephrosis to indicate urinary tract obstruction. 4. Cholelithiasis without evidence of acute cholecystitis. 5. Dilatation of the common bile duct. No intrahepatic biliary ductal dilatation to clearly indicate biliary tract obstruction. Additionally, there is no calcified choledocholithiasis. This is of uncertain etiology and significance, and could be age related, however, correlation with liver function tests is recommended. If there is clinical concern for biliary tract obstruction, further evaluation with abdominal MRI with and without IV gadolinium with MRCP would be recommended. 6. Aortic atherosclerosis, in addition to left main and three-vessel coronary artery disease. Status post median sternotomy for CABG including LIMA to the LAD. 7. There are calcifications of the aortic valve. Echocardiographic correlation for evaluation of potential valvular dysfunction may be warranted if clinically indicated. 8. Additional incidental findings, as above. Electronically Signed   By: Vinnie Langton M.D.   On: 10/23/2021 07:20   [place] EKG SITE RITE  Result Date: 10/28/2021 If Site Rite image not attached, placement could not be confirmed due to current cardiac rhythm.     Subjective:  He denies any abdominal pain, nausea, vomiting, no further diarrhea Discharge Exam: Vitals:   11/05/21 0600 11/05/21 0810  BP:  133/74  Pulse: 68 65  Resp: 20 (!) 24  Temp:  98.2 F (36.8 C)  SpO2: 92% 96%   Vitals:   11/05/21 0443 11/05/21 0500 11/05/21 0600 11/05/21 0810  BP: (!) 136/55   133/74  Pulse: 66 65 68 65  Resp: (!) 24 20 20  (!) 24  Temp: 98 F (36.7 C)   98.2 F (36.8 C)  TempSrc: Oral   Oral  SpO2: 93% 92% 92% 96%  Weight:      Height:        General: Pt is alert, awake, not in acute distress Cardiovascular: RRR, S1/S2 +, no rubs, no  gallops Respiratory: CTA bilaterally, no wheezing, no rhonchi Abdominal: Soft, NT, ND, bowel sounds + Extremities: no edema, no cyanosis    The results of significant diagnostics from this hospitalization (including imaging, microbiology, ancillary and laboratory) are listed below for reference.     Microbiology: Recent Results (from the past 240 hour(s))  Urine Culture     Status: None   Collection Time: 10/31/21 11:23 AM   Specimen: Urine, Clean Catch  Result Value Ref Range Status   Specimen Description URINE, CLEAN CATCH  Final   Special Requests NONE  Final   Culture   Final    NO GROWTH Performed at [facility], [address] [place], [place] [address]    Report Status 11/01/2021 FINAL  Final  Resp Panel by RT-PCR (Flu A&B, Covid) Nasopharyngeal Swab     Status: None   Collection Time: 10/31/21 12:53 PM   Specimen: Nasopharyngeal Swab; Nasopharyngeal(NP) swabs in vial transport medium  Result Value Ref Range Status   SARS Coronavirus 2  by RT PCR NEGATIVE NEGATIVE Final    Comment: (NOTE) SARS-CoV-2 target nucleic acids are NOT DETECTED.  The SARS-CoV-2 RNA is generally detectable in upper respiratory specimens during the acute phase of infection. The lowest concentration of SARS-CoV-2 viral copies this assay can detect is 138 copies/mL. A negative result does not preclude SARS-Cov-2 infection and should not be used as the sole basis for treatment or other patient management decisions. A negative result may occur with  improper specimen collection/handling, submission of specimen other than nasopharyngeal swab, presence of viral mutation(s) within the areas targeted by this assay, and inadequate number of viral copies(<138 copies/mL). A negative result must be combined with clinical observations, patient history, and epidemiological information. The expected result is Negative.  Fact Sheet for Patients:  EntrepreneurPulse.com.au  Fact  Sheet for Healthcare Providers:  IncredibleEmployment.be  This test is no t yet approved or cleared by the [place] FDA and  has been authorized for detection and/or diagnosis of SARS-CoV-2 by FDA under an Emergency Use Authorization (EUA). This EUA will remain  in effect (meaning this test can be used) for the duration of the COVID-19 declaration under Section 564(b)(1) of the Act, 21 U.S.C.section 360bbb-3(b)(1), unless the authorization is terminated  or revoked sooner.       Influenza A by PCR NEGATIVE NEGATIVE Final   Influenza B by PCR NEGATIVE NEGATIVE Final    Comment: (NOTE) The Xpert Xpress SARS-CoV-2/FLU/RSV plus assay is intended as an aid in the diagnosis of influenza from Nasopharyngeal swab specimens and should not be used as a sole basis for treatment. Nasal washings and aspirates are unacceptable for Xpert Xpress SARS-CoV-2/FLU/RSV testing.  Fact Sheet for Patients: EntrepreneurPulse.com.au  Fact Sheet for Healthcare Providers: IncredibleEmployment.be  This test is not yet approved or cleared by the [place] FDA and has been authorized for detection and/or diagnosis of SARS-CoV-2 by FDA under an Emergency Use Authorization (EUA). This EUA will remain in effect (meaning this test can be used) for the duration of the COVID-19 declaration under Section 564(b)(1) of the Act, 21 U.S.C. section 360bbb-3(b)(1), unless the authorization is terminated or revoked.  Performed at [facility], [place] [address] [place], [place] [address]   Culture, blood (routine x 2)     Status: None   Collection Time: 10/31/21  1:56 PM   Specimen: BLOOD RIGHT HAND  Result Value Ref Range Status   Specimen Description BLOOD RIGHT HAND  Final   Special Requests   Final    BOTTLES DRAWN AEROBIC AND ANAEROBIC Blood Culture adequate volume   Culture   Final    NO GROWTH 5 DAYS Performed at [facility],  [place] [address], [place], [place] [address]    Report Status 11/05/2021 FINAL  Final  Culture, blood (routine x 2)     Status: None   Collection Time: 10/31/21  2:03 PM   Specimen: BLOOD RIGHT WRIST  Result Value Ref Range Status   Specimen Description BLOOD RIGHT WRIST  Final   Special Requests   Final    BOTTLES DRAWN AEROBIC AND ANAEROBIC Blood Culture adequate volume   Culture   Final    NO GROWTH 5 DAYS Performed at [facility], [place] [address], [place], [place] [address]    Report Status 11/05/2021 FINAL  Final     Labs: BNP (last 3 results) Recent Labs    10/29/21 0209 10/30/21 0127  BNP 727.2* 195.0*   Basic Metabolic Panel: Recent Labs  Lab 10/30/21 0127 10/31/21 0145 11/01/21 0056 11/02/21 0252 11/04/21 0418  NA 138 138 138 136 135  K 3.6 3.6 3.6 3.9 3.9  CL 105 105 106 105 105  CO2 23 25 25 22 23   GLUCOSE 155* 161* 132* 123* 128*  BUN 25* 22 20 15 11   CREATININE 1.08 1.11 1.18 1.11 1.14  CALCIUM 7.6* 7.7* 7.7* 7.6* 8.2*  MG 1.7 2.0 2.0  --   --    Liver Function Tests: Recent Labs  Lab 10/30/21 0127 10/31/21 0145 11/01/21 0056 11/02/21 0252  AST 27 24 64* 43*  ALT 63* 46* 51* 40  ALKPHOS 85 79 77 74  BILITOT 2.0* 1.3* 1.2 1.2  PROT 4.9* 4.8* 5.0* 5.0*  ALBUMIN 2.0* 1.9* 1.9* 1.8*   No results for input(s): LIPASE, AMYLASE in the last 168 hours. No results for input(s): AMMONIA in the last 168 hours. CBC: Recent Labs  Lab 10/30/21 0127 10/31/21 0145 11/01/21 0056 11/02/21 0252 11/03/21 0404 11/04/21 0418  WBC 15.3* 14.4* 15.5* 11.8* 10.2 8.6  NEUTROABS 11.7* 11.2* 12.7*  --   --   --   HGB 10.0* 9.3* 8.9* 8.4* 8.4* 8.4*  HCT 30.7* 28.5* 28.6* 27.1* 26.9* 26.5*  MCV 88.0 88.5 90.8 90.3 90.6 90.8  PLT 148* 184 218 270 316 370   Cardiac Enzymes: No results for input(s): CKTOTAL, CKMB, CKMBINDEX, TROPONINI in the last 168 hours. BNP: Invalid input(s): POCBNP CBG: Recent Labs  Lab 11/04/21 0819 11/04/21 1211 11/04/21 1615  11/04/21 2101 11/05/21 0811  GLUCAP 160* 143* 154* 165* 142*   D-Dimer No results for input(s): DDIMER in the last 72 hours. Hgb A1c No results for input(s): HGBA1C in the last 72 hours. Lipid Profile No results for input(s): CHOL, HDL, LDLCALC, TRIG, CHOLHDL, LDLDIRECT in the last 72 hours. Thyroid function studies No results for input(s): TSH, T4TOTAL, T3FREE, THYROIDAB in the last 72 hours.  Invalid input(s): FREET3 Anemia work up No results for input(s): VITAMINB12, FOLATE, FERRITIN, TIBC, IRON, RETICCTPCT in the last 72 hours. Urinalysis    Component Value Date/Time   COLORURINE AMBER (A) 10/31/2021 1205   APPEARANCEUR CLEAR 10/31/2021 1205   LABSPEC 1.021 10/31/2021 1205   PHURINE 5.0 10/31/2021 1205   GLUCOSEU NEGATIVE 10/31/2021 1205   HGBUR NEGATIVE 10/31/2021 1205   [place] 10/31/2021 1205   BILIRUBINUR negative 11/05/2017 1457   KETONESUR NEGATIVE 10/31/2021 1205   PROTEINUR NEGATIVE 10/31/2021 1205   UROBILINOGEN 1.0 11/05/2017 1457   UROBILINOGEN 1.0 06/24/2013 2159   NITRITE NEGATIVE 10/31/2021 1205   LEUKOCYTESUR NEGATIVE 10/31/2021 1205   Sepsis Labs Invalid input(s): PROCALCITONIN,  WBC,  LACTICIDVEN Microbiology Recent Results (from the past 240 hour(s))  Urine Culture     Status: None   Collection Time: 10/31/21 11:23 AM   Specimen: Urine, Clean Catch  Result Value Ref Range Status   Specimen Description URINE, CLEAN CATCH  Final   Special Requests NONE  Final   Culture   Final    NO GROWTH Performed at [facility], [place] [address], [place], [place] [address]    Report Status 11/01/2021 FINAL  Final  Resp Panel by RT-PCR (Flu A&B, Covid) Nasopharyngeal Swab     Status: None   Collection Time: 10/31/21 12:53 PM   Specimen: Nasopharyngeal Swab; Nasopharyngeal(NP) swabs in vial transport medium  Result Value Ref Range Status   SARS Coronavirus 2 by RT PCR NEGATIVE NEGATIVE Final    Comment: (NOTE) SARS-CoV-2 target nucleic  acids are NOT  DETECTED.  The SARS-CoV-2 RNA is generally detectable in upper respiratory specimens during the acute phase of infection. The lowest concentration of SARS-CoV-2 viral copies this assay can detect is 138 copies/mL. A negative result does not preclude SARS-Cov-2 infection and should not be used as the sole basis for treatment or other patient management decisions. A negative result may occur with  improper specimen collection/handling, submission of specimen other than nasopharyngeal swab, presence of viral mutation(s) within the areas targeted by this assay, and inadequate number of viral copies(<138 copies/mL). A negative result must be combined with clinical observations, patient history, and epidemiological information. The expected result is Negative.  Fact Sheet for Patients:  EntrepreneurPulse.com.au  Fact Sheet for Healthcare Providers:  IncredibleEmployment.be  This test is no t yet approved or cleared by the [place] FDA and  has been authorized for detection and/or diagnosis of SARS-CoV-2 by FDA under an Emergency Use Authorization (EUA). This EUA will remain  in effect (meaning this test can be used) for the duration of the COVID-19 declaration under Section 564(b)(1) of the Act, 21 U.S.C.section 360bbb-3(b)(1), unless the authorization is terminated  or revoked sooner.       Influenza A by PCR NEGATIVE NEGATIVE Final   Influenza B by PCR NEGATIVE NEGATIVE Final    Comment: (NOTE) The Xpert Xpress SARS-CoV-2/FLU/RSV plus assay is intended as an aid in the diagnosis of influenza from Nasopharyngeal swab specimens and should not be used as a sole basis for treatment. Nasal washings and aspirates are unacceptable for Xpert Xpress SARS-CoV-2/FLU/RSV testing.  Fact Sheet for Patients: EntrepreneurPulse.com.au  Fact Sheet for Healthcare Providers: IncredibleEmployment.be  This  test is not yet approved or cleared by the [place] FDA and has been authorized for detection and/or diagnosis of SARS-CoV-2 by FDA under an Emergency Use Authorization (EUA). This EUA will remain in effect (meaning this test can be used) for the duration of the COVID-19 declaration under Section 564(b)(1) of the Act, 21 U.S.C. section 360bbb-3(b)(1), unless the authorization is terminated or revoked.  Performed at [facility], [place] [address] [place], [place] [address]   Culture, blood (routine x 2)     Status: None   Collection Time: 10/31/21  1:56 PM   Specimen: BLOOD RIGHT HAND  Result Value Ref Range Status   Specimen Description BLOOD RIGHT HAND  Final   Special Requests   Final    BOTTLES DRAWN AEROBIC AND ANAEROBIC Blood Culture adequate volume   Culture   Final    NO GROWTH 5 DAYS Performed at [facility], [place] [address], [place], [place] [address]    Report Status 11/05/2021 FINAL  Final  Culture, blood (routine x 2)     Status: None   Collection Time: 10/31/21  2:03 PM   Specimen: BLOOD RIGHT WRIST  Result Value Ref Range Status   Specimen Description BLOOD RIGHT WRIST  Final   Special Requests   Final    BOTTLES DRAWN AEROBIC AND ANAEROBIC Blood Culture adequate volume   Culture   Final    NO GROWTH 5 DAYS Performed at [facility], [place] [address], [place], [place] [address]    Report Status 11/05/2021 FINAL  Final     Time coordinating discharge: Over 30 minutes  SIGNED:   Phillips Climes, MD  [facility] 11/05/2021, 10:34 AM Pager   If 7PM-7AM, please contact night-coverage [email or web address] Password [email or web address]

## 2021-11-05 NOTE — Progress Notes (Signed)
Attempted to call report 5 times to [facility]. The Network engineer at [facility] could not get anyone to answer as well. [facility] is set to come pick the patient up at 5pm.

## 2021-11-05 NOTE — Progress Notes (Signed)
° °  Palliative Medicine Inpatient Follow Up Note  HPI:  Per hospitalist note  --> 81 year old gentleman with a history of coronary artery disease status post CABG 2019, paroxysmal atrial fibrillation, diabetes, GERD, CKD who presented with abdominal pain, found to have ascending cholangitis and bacteremia, he was kept in ICU was seen by GI underwent ERCP with gallstone extraction, he was also seen by ID for bacteremia.  He was transferred to my service on 10/28/2021 on day 5 of his hospital admission.   Palliative care has been asked to get involved to further discuss goals of care in the setting of prolonged hospitalization, bacteremia, and overall frail physical condition.  Today's Discussion (11/05/2021):  *Please note that this is a verbal dictation therefore any spelling or grammatical errors are due to the "[place] [employer]" system interpretation.  Chart reviewed inclusive of vital signs, progress notes, laboratory results, and diagnostic images.   I met with Jonty, at bedside this morning he was joined by his wife [name]  Dr. Waldron Labs was present explaining the plan for discharge and medications.  After the primary team left I was able to sit down with Rolan Bucco and his wife.  Reviewed in summary how his hospital course is gone and the reality that he is improving.  Almyra Free shares with me the ambition of getting home to his grandson again.  Questions and concerns answered.   Palliative Support Provided.  Plan for DC to [facility].  Objective Assessment: Vital Signs Vitals:   11/05/21 0600 11/05/21 0810  BP:  133/74  Pulse: 68 65  Resp: 20 (!) 24  Temp:  98.2 F (36.8 C)  SpO2: 92% 96%    Intake/Output Summary (Last 24 hours) at 11/05/2021 1055 Last data filed at 11/05/2021 2197 Gross per 24 hour  Intake --  Output 1275 ml  Net -1275 ml    Last Weight  Most recent update: 11/03/2021  3:59 AM    Weight  126.6 kg (279 lb 1.6 oz)            Gen:  Elderly M in  NAD HEENT: moist mucous membranes CV: Regular rate and irregular rhythm PULM: ON RA breathing is even and nonlabored ABD: soft/nontender EXT: No edema  Neuro: Alert and oriented x2-3   SUMMARY OF RECOMMENDATIONS   DNAR/DNI   Goal: To get back to baseline and back home  Plan for transition to [facility] this weekend   TOC - OP Palliative at the time of discharge   Discharge to [facility] this morning  MDM - Moderate ______________________________________________________________________________________ [place] Team Team Cell Phone: [phone] Please utilize secure chat with additional questions, if there is no response within 30 minutes please call the above phone number  Palliative Medicine Team providers are available by phone from 7am to 7pm daily and can be reached through the team cell phone.  Should this patient require assistance outside of these hours, please call the patient's attending physician.

## 2021-11-05 NOTE — Progress Notes (Signed)
Patient discharged to [facility] via [place], wife at bedside.

## 2021-11-05 NOTE — Progress Notes (Signed)
Occupational Therapy Treatment Patient Details Name: [name] Young. MRN: [id number] DOB: [birth year] Today's Date: 11/05/2021   History of present illness 81 y.o. male presented to the ED 10/23/21 with complaints of abdominal pain with associated nausea and vomiting that began the day before. In ED pt found to be in A-fib RVR, hypertensive, and tachypnea. CTA chest/abd/pelvis reveled dilated bile duct, acute cholelithiasis without cholecystis found to have cholangitis status post ERCP with gastroenterology on 10/23/2021.  His blood cultures on admission are positive for E. coli and Streptococcus species.  PMH significant for CAD s/p CAGB 2019 A-fib,, HTN. HLD, diabetes, GERD, and CKD.   OT comments  Pt progressing to supervisionA to minguardA for transfers and mobility with RW for stability. Pt fatigues easily with minimal exertion, dyspnea, and decreased ability to care for self. Pt ambulating to sink from chair on opposite sides of the room. Pt's O2 >90%. Pt would benefit from continued OT skilled services. SNF appears appropriate. OT following acutely.   Recommendations for follow up therapy are one component of a multi-disciplinary discharge planning process, led by the attending physician.  Recommendations may be updated based on patient status, additional functional criteria and insurance authorization.    Follow Up Recommendations  Skilled nursing-short term rehab (<3 hours/day)    Assistance Recommended at Discharge Frequent or constant Supervision/Assistance  Patient can return home with the following  A little help with bathing/dressing/bathroom   Equipment Recommendations  Toilet riser    Recommendations for Other Services      Precautions / Restrictions Precautions Precautions: Fall Precaution Comments: urine incontinence Restrictions Weight Bearing Restrictions: No       Mobility Bed Mobility Overal bed mobility: Needs Assistance Bed Mobility: Sit to Supine        Sit to supine: Min assist   General bed mobility comments: +rail use and pt using rails to [facility]    Transfers Overall transfer level: Needs assistance Equipment used: Rolling walker (2 wheels) Transfers: Sit to/from Stand Sit to Stand: Min guard     Step pivot transfers: Min guard     General transfer comment: No assist for power up, steadied with RW     Balance Overall balance assessment: Needs assistance Sitting-balance support: No upper extremity supported, Feet supported Sitting balance-Leahy Scale: Good     Standing balance support: Bilateral upper extremity supported, During functional activity, Reliant on assistive device for balance Standing balance-Leahy Scale: Poor                             ADL either performed or assessed with clinical judgement   ADL Overall ADL's : Needs assistance/impaired Eating/Feeding: Supervision/ safety   Grooming: Min guard;Standing;Oral care;Wash/dry face Grooming Details (indicate cue type and reason): washed faced, rinsed with mouthwash             Lower Body Dressing: Maximal assistance;Sitting/lateral leans;Sit to/from stand               Functional mobility during ADLs: Minimal assistance;Rolling walker (2 wheels) General ADL Comments: Pt fatigues easily with minimal exertion, dyspnea, and decreased ability to care for self. Pt's O2 >90%.    Extremity/Trunk Assessment Upper Extremity Assessment Upper Extremity Assessment: Generalized weakness LUE Deficits / Details: edema but improving   Lower Extremity Assessment Lower Extremity Assessment: Generalized weakness        Vision   Vision Assessment?: No apparent visual deficits   Perception     Praxis  Cognition Arousal/Alertness: Awake/alert Behavior During Therapy: WFL for tasks assessed/performed Overall Cognitive Status: Within Functional Limits for tasks assessed                                 General Comments:  He appears to take time to process due to fatigue after exertion.        Exercises      Shoulder Instructions       General Comments Pt's O2 >90% with exertion.    Pertinent Vitals/ Pain       Pain Assessment Pain Assessment: 0-10 Pain Score: 4  Breathing: occasional labored breathing, short period of hyperventilation Negative Vocalization: occasional moan/groan, low speech, negative/disapproving quality Facial Expression: sad, frightened, frown Body Language: relaxed Consolability: distracted or reassured by voice/touch PAINAD Score: 4 Pain Location: bottom Pain Descriptors / Indicators: Discomfort, Grimacing Pain Intervention(s): Monitored during session, Repositioned  Home Living                                          Prior Functioning/Environment              Frequency  Min 2X/week        Progress Toward Goals  OT Goals(current goals can now be found in the care plan section)  Progress towards OT goals: Progressing toward goals  Acute Rehab OT Goals Patient Stated Goal: to go home OT Goal Formulation: With patient Time For Goal Achievement: 11/09/21 Potential to Achieve Goals: Good ADL Goals Pt Will Perform Lower Body Bathing: with mod assist;sit to/from stand Pt Will Transfer to Toilet: with mod assist;with +2 assist;stand pivot transfer;bedside commode Additional ADL Goal #1: Pt to complete  bed mobility at Mod A in prep for ADL transfers Additional ADL Goal #2: Pt to demonstrate ability to sit EOB > 5 min with no more than Min A to maintain balance during functional tasks  Plan Discharge plan remains appropriate    Co-evaluation                 AM-PAC OT "6 Clicks" Daily Activity     Outcome Measure   Help from another person eating meals?: A Little Help from another person taking care of personal grooming?: A Little Help from another person toileting, which includes using toliet, bedpan, or urinal?: A Lot Help  from another person bathing (including washing, rinsing, drying)?: A Lot Help from another person to put on and taking off regular upper body clothing?: A Little Help from another person to put on and taking off regular lower body clothing?: A Lot 6 Click Score: 15    End of Session Equipment Utilized During Treatment: Rolling walker (2 wheels);Gait belt  OT Visit Diagnosis: Unsteadiness on feet (R26.81);Other abnormalities of gait and mobility (R26.89);Muscle weakness (generalized) (M62.81)   Activity Tolerance Patient limited by fatigue   Patient Left in bed;with call bell/phone within reach   Nurse Communication Mobility status        Time: 1000-1033 OT Time Calculation (min): 33 min  Charges: OT General Charges $OT Visit: 1 Visit OT Treatments $Self Care/Home Management : 8-22 mins $Therapeutic Activity: 8-22 mins  Jefferey Pica, OTR/L Acute Rehabilitation Services Pager: [phone] Office: [phone]   Amanii Snethen C 11/05/2021, 11:21 AM

## 2021-11-07 ENCOUNTER — Telehealth: Payer: Self-pay

## 2021-11-07 DIAGNOSIS — E782 Mixed hyperlipidemia: Secondary | ICD-10-CM | POA: Diagnosis not present

## 2021-11-07 DIAGNOSIS — I482 Chronic atrial fibrillation, unspecified: Secondary | ICD-10-CM | POA: Diagnosis not present

## 2021-11-07 DIAGNOSIS — I10 Essential (primary) hypertension: Secondary | ICD-10-CM | POA: Diagnosis not present

## 2021-11-07 DIAGNOSIS — K8309 Other cholangitis: Secondary | ICD-10-CM | POA: Diagnosis not present

## 2021-11-07 DIAGNOSIS — E1122 Type 2 diabetes mellitus with diabetic chronic kidney disease: Secondary | ICD-10-CM | POA: Diagnosis not present

## 2021-11-07 DIAGNOSIS — K219 Gastro-esophageal reflux disease without esophagitis: Secondary | ICD-10-CM | POA: Diagnosis not present

## 2021-11-07 DIAGNOSIS — R17 Unspecified jaundice: Secondary | ICD-10-CM | POA: Diagnosis not present

## 2021-11-07 DIAGNOSIS — I330 Acute and subacute infective endocarditis: Secondary | ICD-10-CM | POA: Diagnosis not present

## 2021-11-07 DIAGNOSIS — R6521 Severe sepsis with septic shock: Secondary | ICD-10-CM | POA: Diagnosis not present

## 2021-11-07 NOTE — Telephone Encounter (Signed)
LVM with Lajuana Matte about antibiotic plans. - Estill Bamberg

## 2021-11-07 NOTE — Telephone Encounter (Signed)
Lajuana Matte with [facility] called stating the patient has missed 3 doses of cefazolin due to him being a new admit and cefazolin being on backorder.   Lajuana Matte also stated the patient's picc line is "clogged" now, they are waiting on cath flo from their pharmacy in [place] and waiting on the vascular to come and evaluate the picc line. Lajuana Matte is wanting to know is their any other options for the patient. Lajuana Matte can be reached at [phone] ext [phone]. Please advise

## 2021-11-07 NOTE — Telephone Encounter (Signed)
See above telephone note. Dr. Juleen China, would you consider ceftriaxone in the interim if PICC line issue is addressed? Otherwise, let me know if you would like him to temporarily receive oral therapy or if we can try for Unasyn at the rehab center. Thanks, Estill Bamberg

## 2021-11-08 NOTE — Telephone Encounter (Signed)
LVM again with [facility] re[facility] relaying verbal orders on answering machine. Requested callback to confirm verbal orders were received. Also asked for fax number so we could fax over verbal orders.

## 2021-11-09 DIAGNOSIS — I330 Acute and subacute infective endocarditis: Secondary | ICD-10-CM | POA: Diagnosis not present

## 2021-11-09 DIAGNOSIS — R6521 Severe sepsis with septic shock: Secondary | ICD-10-CM | POA: Diagnosis not present

## 2021-11-09 DIAGNOSIS — E782 Mixed hyperlipidemia: Secondary | ICD-10-CM | POA: Diagnosis not present

## 2021-11-09 DIAGNOSIS — R17 Unspecified jaundice: Secondary | ICD-10-CM | POA: Diagnosis not present

## 2021-11-09 DIAGNOSIS — K219 Gastro-esophageal reflux disease without esophagitis: Secondary | ICD-10-CM | POA: Diagnosis not present

## 2021-11-09 DIAGNOSIS — I10 Essential (primary) hypertension: Secondary | ICD-10-CM | POA: Diagnosis not present

## 2021-11-09 DIAGNOSIS — I482 Chronic atrial fibrillation, unspecified: Secondary | ICD-10-CM | POA: Diagnosis not present

## 2021-11-09 DIAGNOSIS — E1122 Type 2 diabetes mellitus with diabetic chronic kidney disease: Secondary | ICD-10-CM | POA: Diagnosis not present

## 2021-11-09 DIAGNOSIS — D649 Anemia, unspecified: Secondary | ICD-10-CM | POA: Diagnosis not present

## 2021-11-10 DIAGNOSIS — E782 Mixed hyperlipidemia: Secondary | ICD-10-CM | POA: Diagnosis not present

## 2021-11-10 DIAGNOSIS — I482 Chronic atrial fibrillation, unspecified: Secondary | ICD-10-CM | POA: Diagnosis not present

## 2021-11-10 DIAGNOSIS — E1122 Type 2 diabetes mellitus with diabetic chronic kidney disease: Secondary | ICD-10-CM | POA: Diagnosis not present

## 2021-11-10 DIAGNOSIS — K219 Gastro-esophageal reflux disease without esophagitis: Secondary | ICD-10-CM | POA: Diagnosis not present

## 2021-11-10 DIAGNOSIS — I10 Essential (primary) hypertension: Secondary | ICD-10-CM | POA: Diagnosis not present

## 2021-11-10 DIAGNOSIS — R6521 Severe sepsis with septic shock: Secondary | ICD-10-CM | POA: Diagnosis not present

## 2021-11-10 DIAGNOSIS — I330 Acute and subacute infective endocarditis: Secondary | ICD-10-CM | POA: Diagnosis not present

## 2021-11-10 DIAGNOSIS — D649 Anemia, unspecified: Secondary | ICD-10-CM | POA: Diagnosis not present

## 2021-11-10 DIAGNOSIS — R17 Unspecified jaundice: Secondary | ICD-10-CM | POA: Diagnosis not present

## 2021-11-11 DIAGNOSIS — I10 Essential (primary) hypertension: Secondary | ICD-10-CM | POA: Diagnosis not present

## 2021-11-11 DIAGNOSIS — I330 Acute and subacute infective endocarditis: Secondary | ICD-10-CM | POA: Diagnosis not present

## 2021-11-11 DIAGNOSIS — I482 Chronic atrial fibrillation, unspecified: Secondary | ICD-10-CM | POA: Diagnosis not present

## 2021-11-11 DIAGNOSIS — R17 Unspecified jaundice: Secondary | ICD-10-CM | POA: Diagnosis not present

## 2021-11-11 DIAGNOSIS — E782 Mixed hyperlipidemia: Secondary | ICD-10-CM | POA: Diagnosis not present

## 2021-11-11 DIAGNOSIS — E1122 Type 2 diabetes mellitus with diabetic chronic kidney disease: Secondary | ICD-10-CM | POA: Diagnosis not present

## 2021-11-11 DIAGNOSIS — D649 Anemia, unspecified: Secondary | ICD-10-CM | POA: Diagnosis not present

## 2021-11-11 DIAGNOSIS — K219 Gastro-esophageal reflux disease without esophagitis: Secondary | ICD-10-CM | POA: Diagnosis not present

## 2021-11-11 DIAGNOSIS — R6521 Severe sepsis with septic shock: Secondary | ICD-10-CM | POA: Diagnosis not present

## 2021-11-12 ENCOUNTER — Encounter: Payer: Self-pay | Admitting: Family Medicine

## 2021-11-12 DIAGNOSIS — I330 Acute and subacute infective endocarditis: Secondary | ICD-10-CM | POA: Insufficient documentation

## 2021-11-14 DIAGNOSIS — D649 Anemia, unspecified: Secondary | ICD-10-CM | POA: Diagnosis not present

## 2021-11-14 DIAGNOSIS — R6521 Severe sepsis with septic shock: Secondary | ICD-10-CM | POA: Diagnosis not present

## 2021-11-14 DIAGNOSIS — E1122 Type 2 diabetes mellitus with diabetic chronic kidney disease: Secondary | ICD-10-CM | POA: Diagnosis not present

## 2021-11-14 DIAGNOSIS — I330 Acute and subacute infective endocarditis: Secondary | ICD-10-CM | POA: Diagnosis not present

## 2021-11-14 DIAGNOSIS — I10 Essential (primary) hypertension: Secondary | ICD-10-CM | POA: Diagnosis not present

## 2021-11-14 DIAGNOSIS — B372 Candidiasis of skin and nail: Secondary | ICD-10-CM | POA: Diagnosis not present

## 2021-11-14 DIAGNOSIS — R17 Unspecified jaundice: Secondary | ICD-10-CM | POA: Diagnosis not present

## 2021-11-14 DIAGNOSIS — E782 Mixed hyperlipidemia: Secondary | ICD-10-CM | POA: Diagnosis not present

## 2021-11-14 DIAGNOSIS — I482 Chronic atrial fibrillation, unspecified: Secondary | ICD-10-CM | POA: Diagnosis not present

## 2021-11-16 ENCOUNTER — Ambulatory Visit: Payer: Medicare PPO | Admitting: Internal Medicine

## 2021-11-17 ENCOUNTER — Encounter: Payer: Self-pay | Admitting: Internal Medicine

## 2021-11-17 ENCOUNTER — Ambulatory Visit: Payer: Medicare PPO | Admitting: Internal Medicine

## 2021-11-17 ENCOUNTER — Other Ambulatory Visit: Payer: Self-pay

## 2021-11-17 VITALS — BP 140/79 | HR 98 | Temp 97.7°F | Wt 243.0 lb

## 2021-11-17 DIAGNOSIS — B955 Unspecified streptococcus as the cause of diseases classified elsewhere: Secondary | ICD-10-CM | POA: Diagnosis not present

## 2021-11-17 DIAGNOSIS — I330 Acute and subacute infective endocarditis: Secondary | ICD-10-CM

## 2021-11-17 DIAGNOSIS — R7881 Bacteremia: Secondary | ICD-10-CM | POA: Diagnosis not present

## 2021-11-17 DIAGNOSIS — K8033 Calculus of bile duct with acute cholangitis with obstruction: Secondary | ICD-10-CM

## 2021-11-17 DIAGNOSIS — B9620 Unspecified Escherichia coli [E. coli] as the cause of diseases classified elsewhere: Secondary | ICD-10-CM

## 2021-11-17 NOTE — Progress Notes (Signed)
[facility] for Infectious Disease  CHIEF COMPLAINT:    Follow up for mitral valve endocarditis  SUBJECTIVE:    [name] Haug. is a 81 y.o. male with PMHx as below who presents to the clinic for hospital follow up regarding sepsis due to streptococcus infantarius and E coli bacteremia due to ascending cholangitis complicated by the presence of MV endocarditis noted on TEE.   Patient was admitted at [facility] 10/23/21 - 11/05/21 where he was admitted with above issues. He underwent ERCP on 10/23/21 with GI and had removal of gallstones at that time.  Repeat blood cultures were negative as of 10/24/21 and he slowly improved during his hospitalization with improved LFTs and creatinine.  TEE was completed on 2/17 that confirmed the presence of a small MV vegetation.  PICC line was placed and he was discharged to SNF on 2/24 to complete 6 weeks of IV antibiotics through 12/05/21.  There were some issues with getting access to cefazolin at the SNF as well as PICC not functioning initially, but this has since been resolved and he is on appropriate antibiotics without issues.  Labs obtained from 3/7 were reviewed showing normal creatinine, normal WBC.  Hemoglobin remains low which is concerning for his wife but is relatively stable from when discharged.  Patient reports that he is slowly improving but still tired and worn out from his long admission.  He is greatly looking forward to being back home and having home cooked meals and sleeping in his bed.  He has had no fevers, chills, N/V/D, abdominal pain.  They report he has been dealing with a cough and a feeling of pine needles in his throat. The SNF is giving him some medication for this.     Please see A&P for the details of today's visit and status of the patient's medical problems.   Patient's Medications  New Prescriptions   No medications on file  Previous Medications   ACCU-CHEK AVIVA PLUS TEST STRIP    USE AS  DIRECTED TO CHECK BLOOD SUGARS UPTO FOUR TIMES DAILY.   APIXABAN (ELIQUIS) 5 MG TABS TABLET    Take 1 tablet (5 mg total) by mouth 2 (two) times daily.   BLOOD GLUCOSE METER KIT AND SUPPLIES    Dispense based on patient and insurance preference. Use up to four times daily as directed. (FOR ICD-10 E10.9, E11.9).   CEFAZOLIN (ANCEF) IVPB    Inject 2 g into the vein every 8 (eight) hours. Indication:  MV endocarditis First Dose: Yes Last Day of Therapy:  12/05/21 Labs - Once weekly:  CBC/D and BMP, Labs - Every other week:  ESR and CRP Method of administration: IV Push Method of administration may be changed at the discretion of home infusion pharmacist based upon assessment of the patient and/or caregiver's ability to self-administer the medication ordered.   CHOLECALCIFEROL (VITAMIN D3) 25 MCG (1000 UT) CAPS    Take 1 capsule (1,000 Units total) by mouth daily.   CYANOCOBALAMIN (B-12) 1000 MCG SUBL    Place 1 tablet under the tongue daily.   DAPAGLIFLOZIN PROPANEDIOL (FARXIGA) 5 MG TABS TABLET    Take 1 tablet (5 mg total) by mouth daily.   FENOFIBRATE (TRICOR) 145 MG TABLET    Take 1 tablet (145 mg total) by mouth daily.   INSULIN ASPART (NOVOLOG) 100 UNIT/ML INJECTION    Inject 0-15 Units into the skin 3 (three) times daily with meals.  METOPROLOL SUCCINATE (TOPROL-XL) 50 MG 24 HR TABLET    Take 1 tablet (50 mg total) by mouth daily. Take with or immediately following a meal.   MULTIPLE VITAMINS-MINERALS (MACULAR VITAMIN BENEFIT PO)    Take 1 tablet by mouth daily.   NITROGLYCERIN (NITROSTAT) 0.4 MG SL TABLET    Place 1 tablet (0.4 mg total) under the tongue every 5 (five) minutes as needed for chest pain.   OMEGA-3 FATTY ACIDS (FISH OIL) 1200 MG CAPS    Take 2 capsules (2,400 mg total) by mouth daily.   OMEPRAZOLE (PRILOSEC) 40 MG CAPSULE    Take 1 capsule (40 mg total) by mouth every Monday, Wednesday, and Friday.   ROSUVASTATIN (CRESTOR) 10 MG TABLET    TAKE 1 TABLET BY MOUTH ONCE A DAY    SACUBITRIL-VALSARTAN (ENTRESTO) 24-26 MG    Take 1 tablet by mouth 2 (two) times daily.   TAMSULOSIN (FLOMAX) 0.4 MG CAPS CAPSULE    Take 1 capsule (0.4 mg total) by mouth daily.  Modified Medications   No medications on file  Discontinued Medications   No medications on file      Past Medical History:  Diagnosis Date   Arthritis    Cataract    lens implant bilateral   Chronic kidney disease    kidney stones   Coronary artery disease    Dyspnea    Dysrhythmia    GERD (gastroesophageal reflux disease)    History of cholelithiasis    History of kidney stones    ca ox [name]) now [employer]   History of pneumonia    HLD (hyperlipidemia)    HTN (hypertension)    Jaundice    age 38   Pneumonia    years ago    T2DM (type 2 diabetes mellitus) ([place]) 2010    Social History   Tobacco Use   Smoking status: Never   Smokeless tobacco: Former    Types: Chew    Quit date: 09/08/2001  Vaping Use   Vaping Use: Never used  Substance Use Topics   Alcohol use: Yes    Comment: occ   Drug use: No    Family History  Problem Relation Age of Onset   Alzheimer's disease Mother    Breast cancer Mother        breast   Atrial fibrillation Mother    CAD Mother    Stroke Father 55   Diabetes Father    CAD Father    Colon polyps Sister    Colon polyps Brother    Rectal cancer Maternal Grandfather        rectal   Colon cancer Maternal Grandfather 88   Esophageal cancer Neg Hx    Stomach cancer Neg Hx     No Known Allergies  Review of Systems  Constitutional: Negative.   HENT:  Positive for sore throat.   Respiratory:  Positive for cough.   Cardiovascular: Negative.   Gastrointestinal: Negative.   Skin: Negative.     OBJECTIVE:    Vitals:   11/17/21 1126  BP: 140/79  Pulse: 98  Temp: 97.7 F (36.5 C)  TempSrc: Oral  SpO2: 96%  Weight: 243 lb (110.2 kg)   Body mass index is 33.89 kg/m.  Physical Exam Constitutional:      General: He is not in  acute distress.    Appearance: Normal appearance.  HENT:     Head: Normocephalic and atraumatic.  Pulmonary:     Effort: Pulmonary effort  is normal. No respiratory distress.  Musculoskeletal:     Comments: Left UE PICC line without warmth, tenderness, erythema.   Skin:    General: Skin is warm and dry.  Neurological:     General: No focal deficit present.     Mental Status: He is alert and oriented to person, place, and time.  Psychiatric:        Mood and Affect: Mood normal.        Behavior: Behavior normal.     Labs and Microbiology: CBC Latest Ref Rng & Units 11/04/2021 11/03/2021 11/02/2021  WBC 4.0 - 10.5 K/uL 8.6 10.2 11.8(H)  Hemoglobin 13.0 - 17.0 g/dL 8.4(L) 8.4(L) 8.4(L)  Hematocrit 39.0 - 52.0 % 26.5(L) 26.9(L) 27.1(L)  Platelets 150 - 400 K/uL 370 316 270   CMP Latest Ref Rng & Units 11/04/2021 11/02/2021 11/01/2021  Glucose 70 - 99 mg/dL 128(H) 123(H) 132(H)  BUN 8 - 23 mg/dL 11 15 20   Creatinine 0.61 - 1.24 mg/dL 1.14 1.11 1.18  Sodium 135 - 145 mmol/L 135 136 138  Potassium 3.5 - 5.1 mmol/L 3.9 3.9 3.6  Chloride 98 - 111 mmol/L 105 105 106  CO2 22 - 32 mmol/L 23 22 25   Calcium 8.9 - 10.3 mg/dL 8.2(L) 7.6(L) 7.7(L)  Total Protein 6.5 - 8.1 g/dL - 5.0(L) 5.0(L)  Total Bilirubin 0.3 - 1.2 mg/dL - 1.2 1.2  Alkaline Phos 38 - 126 U/L - 74 77  AST 15 - 41 U/L - 43(H) 64(H)  ALT 0 - 44 U/L - 40 51(H)       ASSESSMENT & PLAN:    Bacterial endocarditis Patient will continue with IV cefazolin 2gm q8h via PICC line through 12/05/21 for MV endocarditis in setting of Strep infantarius and E coli bacteremia due to ascending cholangitis.  At that time, PICC line can be removed and no further antibiotics needed.  Discussed with patient and family that SNF should coordinate with home health to transition antibiotics.  However, if this is not able to be done then they will call us to help with coordination.   Calculus of bile duct with cholangitis and obstruction Patient  underwent ERCP with GI during admission with removal of gallstones.  He should have evaluation for colonic neoplasia due to presence of Strep infantarius however current bacteremia was due to cholangitis.  He can follow up with GI as an outpatient for this once he has recovered further.  He also should have surgical consult to discuss if/when should have cholecystectomy.       Raynelle [facility] for Infectious Disease [facility] 11/17/2021, 12:16 PM  I have personally spent 40 minutes involved in face-to-face and non-face-to-face activities for this patient on the day of the visit. Professional time spent includes the following activities: Preparing to see the patient (review of tests), Obtaining and/or reviewing separately obtained history (admission/discharge record), Performing a medically appropriate examination and/or evaluation , Ordering medications/tests/procedures, referring and communicating with other health care professionals, Documenting clinical information in the EMR, Independently interpreting results (not separately reported), Communicating results to the patient/family/caregiver, Counseling and educating the patient/family/caregiver and Care coordination (not separately reported).

## 2021-11-17 NOTE — Assessment & Plan Note (Signed)
Patient will continue with IV cefazolin 2gm q8h via PICC line through 12/05/21 for MV endocarditis in setting of Strep infantarius and E coli bacteremia due to ascending cholangitis.  At that time, PICC line can be removed and no further antibiotics needed.  Discussed with patient and family that SNF should coordinate with home health to transition antibiotics.  However, if this is not able to be done then they will call us to help with coordination.  ?

## 2021-11-17 NOTE — Assessment & Plan Note (Signed)
Patient underwent ERCP with GI during admission with removal of gallstones.  He should have evaluation for colonic neoplasia due to presence of Strep infantarius however current bacteremia was due to cholangitis.  He can follow up with GI as an outpatient for this once he has recovered further.  He also should have surgical consult to discuss if/when should have cholecystectomy. ?

## 2021-11-17 NOTE — Patient Instructions (Signed)
Patient will continue with IV Cefazolin via PICC line through 12/05/21 for mitral valve endocarditis in setting of Strep infantarius and E coli bacteremia due to ascending cholangitis.  At that time, PICC line can be removed and no further antibiotics needed.  Please coordinate with home health for continuation of IV antibiotics at discharge.  If you cannot help with coordination, please let our office know so that we can coordinate with home health. ? ?Patient underwent ERCP with GI during admission with removal of gallstones.  He should have evaluation for colonic neoplasia due to presence of Strep infantarius however current bacteremia was due to cholangitis with GI follow up.  He also should also have surgical consult to discuss if/when should have cholecystectomy upon discharge.  This can be coordinated via primary care. ? ?If you have any questions or concerns, please do not hesitate to call the office at [phone]. ? ? ? ?

## 2021-11-18 DIAGNOSIS — B372 Candidiasis of skin and nail: Secondary | ICD-10-CM | POA: Diagnosis not present

## 2021-11-18 DIAGNOSIS — I482 Chronic atrial fibrillation, unspecified: Secondary | ICD-10-CM | POA: Diagnosis not present

## 2021-11-18 DIAGNOSIS — K219 Gastro-esophageal reflux disease without esophagitis: Secondary | ICD-10-CM | POA: Diagnosis not present

## 2021-11-18 DIAGNOSIS — R6521 Severe sepsis with septic shock: Secondary | ICD-10-CM | POA: Diagnosis not present

## 2021-11-18 DIAGNOSIS — E782 Mixed hyperlipidemia: Secondary | ICD-10-CM | POA: Diagnosis not present

## 2021-11-18 DIAGNOSIS — D649 Anemia, unspecified: Secondary | ICD-10-CM | POA: Diagnosis not present

## 2021-11-18 DIAGNOSIS — I10 Essential (primary) hypertension: Secondary | ICD-10-CM | POA: Diagnosis not present

## 2021-11-18 DIAGNOSIS — E1122 Type 2 diabetes mellitus with diabetic chronic kidney disease: Secondary | ICD-10-CM | POA: Diagnosis not present

## 2021-11-22 DIAGNOSIS — B372 Candidiasis of skin and nail: Secondary | ICD-10-CM | POA: Diagnosis not present

## 2021-11-22 DIAGNOSIS — I330 Acute and subacute infective endocarditis: Secondary | ICD-10-CM | POA: Diagnosis not present

## 2021-11-22 DIAGNOSIS — I10 Essential (primary) hypertension: Secondary | ICD-10-CM | POA: Diagnosis not present

## 2021-11-22 DIAGNOSIS — E1122 Type 2 diabetes mellitus with diabetic chronic kidney disease: Secondary | ICD-10-CM | POA: Diagnosis not present

## 2021-11-22 DIAGNOSIS — E782 Mixed hyperlipidemia: Secondary | ICD-10-CM | POA: Diagnosis not present

## 2021-11-22 DIAGNOSIS — I482 Chronic atrial fibrillation, unspecified: Secondary | ICD-10-CM | POA: Diagnosis not present

## 2021-11-22 DIAGNOSIS — K219 Gastro-esophageal reflux disease without esophagitis: Secondary | ICD-10-CM | POA: Diagnosis not present

## 2021-11-22 DIAGNOSIS — R6521 Severe sepsis with septic shock: Secondary | ICD-10-CM | POA: Diagnosis not present

## 2021-11-22 DIAGNOSIS — D649 Anemia, unspecified: Secondary | ICD-10-CM | POA: Diagnosis not present

## 2021-11-23 DIAGNOSIS — E782 Mixed hyperlipidemia: Secondary | ICD-10-CM | POA: Diagnosis not present

## 2021-11-23 DIAGNOSIS — E1122 Type 2 diabetes mellitus with diabetic chronic kidney disease: Secondary | ICD-10-CM | POA: Diagnosis not present

## 2021-11-23 DIAGNOSIS — I330 Acute and subacute infective endocarditis: Secondary | ICD-10-CM | POA: Diagnosis not present

## 2021-11-23 DIAGNOSIS — D649 Anemia, unspecified: Secondary | ICD-10-CM | POA: Diagnosis not present

## 2021-11-23 DIAGNOSIS — I10 Essential (primary) hypertension: Secondary | ICD-10-CM | POA: Diagnosis not present

## 2021-11-23 DIAGNOSIS — R6521 Severe sepsis with septic shock: Secondary | ICD-10-CM | POA: Diagnosis not present

## 2021-11-23 DIAGNOSIS — I482 Chronic atrial fibrillation, unspecified: Secondary | ICD-10-CM | POA: Diagnosis not present

## 2021-11-23 DIAGNOSIS — K219 Gastro-esophageal reflux disease without esophagitis: Secondary | ICD-10-CM | POA: Diagnosis not present

## 2021-11-24 DIAGNOSIS — I330 Acute and subacute infective endocarditis: Secondary | ICD-10-CM | POA: Diagnosis not present

## 2021-11-25 ENCOUNTER — Telehealth: Payer: Self-pay | Admitting: Family Medicine

## 2021-11-25 NOTE — Telephone Encounter (Signed)
Home Health verbal orders ?Caller Name:[name] Young ?Agency Name: [facility] ? ?Callback number: [phone] ? ?Requesting OT/PT/Skilled nursing/Social Work/Speech: ? ?Reason:Home Care ? ?Frequency:Orders to pull Picc line in  10 days on 12/06/21 ? ?Please forward to [facility] pool or providers CMA  ?

## 2021-11-25 NOTE — Telephone Encounter (Signed)
Will forward to ID who seems has been managing this patient since discharge.  ?

## 2021-11-28 ENCOUNTER — Telehealth: Payer: Self-pay

## 2021-11-28 DIAGNOSIS — I10 Essential (primary) hypertension: Secondary | ICD-10-CM | POA: Diagnosis not present

## 2021-11-28 NOTE — Telephone Encounter (Signed)
[facility] to relay verbal orders, line continued to ring. No answer and unable to leave message.  ? ?Beryle Flock, RN ? ?

## 2021-11-28 NOTE — Telephone Encounter (Signed)
Patient was discharged from [facility] on 3/16 to [facility]. Spoke with Truman Hayward, RN at [facility] and relayed verbal orders per Dr. Juleen China to remove PICC line after last dose on 12/05/21.  ? ? ?Truman Hayward [facility]): [phone] ? ?Beryle Flock, RN ? ?

## 2021-11-28 NOTE — Telephone Encounter (Signed)
Thank you [name] Young.

## 2021-12-03 DIAGNOSIS — I330 Acute and subacute infective endocarditis: Secondary | ICD-10-CM | POA: Diagnosis not present

## 2021-12-05 DIAGNOSIS — E782 Mixed hyperlipidemia: Secondary | ICD-10-CM

## 2021-12-05 DIAGNOSIS — N189 Chronic kidney disease, unspecified: Secondary | ICD-10-CM

## 2021-12-05 DIAGNOSIS — Z87442 Personal history of urinary calculi: Secondary | ICD-10-CM

## 2021-12-05 DIAGNOSIS — Z794 Long term (current) use of insulin: Secondary | ICD-10-CM

## 2021-12-05 DIAGNOSIS — Z8719 Personal history of other diseases of the digestive system: Secondary | ICD-10-CM

## 2021-12-05 DIAGNOSIS — Z87891 Personal history of nicotine dependence: Secondary | ICD-10-CM

## 2021-12-05 DIAGNOSIS — I480 Paroxysmal atrial fibrillation: Secondary | ICD-10-CM | POA: Diagnosis not present

## 2021-12-05 DIAGNOSIS — M1990 Unspecified osteoarthritis, unspecified site: Secondary | ICD-10-CM

## 2021-12-05 DIAGNOSIS — I129 Hypertensive chronic kidney disease with stage 1 through stage 4 chronic kidney disease, or unspecified chronic kidney disease: Secondary | ICD-10-CM | POA: Diagnosis not present

## 2021-12-05 DIAGNOSIS — Z792 Long term (current) use of antibiotics: Secondary | ICD-10-CM

## 2021-12-05 DIAGNOSIS — K8309 Other cholangitis: Secondary | ICD-10-CM | POA: Diagnosis not present

## 2021-12-05 DIAGNOSIS — Z7984 Long term (current) use of oral hypoglycemic drugs: Secondary | ICD-10-CM

## 2021-12-05 DIAGNOSIS — B9620 Unspecified Escherichia coli [E. coli] as the cause of diseases classified elsewhere: Secondary | ICD-10-CM | POA: Diagnosis not present

## 2021-12-05 DIAGNOSIS — I330 Acute and subacute infective endocarditis: Secondary | ICD-10-CM | POA: Diagnosis not present

## 2021-12-05 DIAGNOSIS — Z8701 Personal history of pneumonia (recurrent): Secondary | ICD-10-CM

## 2021-12-05 DIAGNOSIS — K259 Gastric ulcer, unspecified as acute or chronic, without hemorrhage or perforation: Secondary | ICD-10-CM

## 2021-12-05 DIAGNOSIS — A408 Other streptococcal sepsis: Secondary | ICD-10-CM | POA: Diagnosis not present

## 2021-12-05 DIAGNOSIS — R17 Unspecified jaundice: Secondary | ICD-10-CM | POA: Diagnosis not present

## 2021-12-05 DIAGNOSIS — E1122 Type 2 diabetes mellitus with diabetic chronic kidney disease: Secondary | ICD-10-CM

## 2021-12-05 DIAGNOSIS — R7881 Bacteremia: Secondary | ICD-10-CM | POA: Diagnosis not present

## 2021-12-05 DIAGNOSIS — Z452 Encounter for adjustment and management of vascular access device: Secondary | ICD-10-CM

## 2021-12-05 DIAGNOSIS — Z7901 Long term (current) use of anticoagulants: Secondary | ICD-10-CM

## 2021-12-05 DIAGNOSIS — K219 Gastro-esophageal reflux disease without esophagitis: Secondary | ICD-10-CM

## 2021-12-05 DIAGNOSIS — I499 Cardiac arrhythmia, unspecified: Secondary | ICD-10-CM

## 2021-12-05 DIAGNOSIS — Z9181 History of falling: Secondary | ICD-10-CM

## 2021-12-05 DIAGNOSIS — R6521 Severe sepsis with septic shock: Secondary | ICD-10-CM | POA: Diagnosis not present

## 2021-12-07 ENCOUNTER — Other Ambulatory Visit: Payer: Self-pay

## 2021-12-07 ENCOUNTER — Encounter: Payer: Self-pay | Admitting: Family Medicine

## 2021-12-07 ENCOUNTER — Ambulatory Visit: Payer: Medicare PPO | Admitting: Family Medicine

## 2021-12-07 VITALS — BP 122/72 | HR 57 | Temp 97.8°F | Ht 71.0 in | Wt 235.2 lb

## 2021-12-07 DIAGNOSIS — I330 Acute and subacute infective endocarditis: Secondary | ICD-10-CM

## 2021-12-07 DIAGNOSIS — R7881 Bacteremia: Secondary | ICD-10-CM | POA: Diagnosis not present

## 2021-12-07 DIAGNOSIS — I5022 Chronic systolic (congestive) heart failure: Secondary | ICD-10-CM

## 2021-12-07 DIAGNOSIS — E1122 Type 2 diabetes mellitus with diabetic chronic kidney disease: Secondary | ICD-10-CM

## 2021-12-07 DIAGNOSIS — B9620 Unspecified Escherichia coli [E. coli] as the cause of diseases classified elsewhere: Secondary | ICD-10-CM

## 2021-12-07 DIAGNOSIS — B955 Unspecified streptococcus as the cause of diseases classified elsewhere: Secondary | ICD-10-CM

## 2021-12-07 DIAGNOSIS — E538 Deficiency of other specified B group vitamins: Secondary | ICD-10-CM | POA: Diagnosis not present

## 2021-12-07 DIAGNOSIS — K8309 Other cholangitis: Secondary | ICD-10-CM | POA: Diagnosis not present

## 2021-12-07 DIAGNOSIS — N183 Chronic kidney disease, stage 3 unspecified: Secondary | ICD-10-CM

## 2021-12-07 DIAGNOSIS — E118 Type 2 diabetes mellitus with unspecified complications: Secondary | ICD-10-CM | POA: Diagnosis not present

## 2021-12-07 DIAGNOSIS — I4891 Unspecified atrial fibrillation: Secondary | ICD-10-CM | POA: Diagnosis not present

## 2021-12-07 DIAGNOSIS — E669 Obesity, unspecified: Secondary | ICD-10-CM

## 2021-12-07 LAB — COMPREHENSIVE METABOLIC PANEL
ALT: 3 U/L (ref 0–53)
AST: 13 U/L (ref 0–37)
Albumin: 3.6 g/dL (ref 3.5–5.2)
Alkaline Phosphatase: 56 U/L (ref 39–117)
BUN: 25 mg/dL — ABNORMAL HIGH (ref 6–23)
CO2: 27 mEq/L (ref 19–32)
Calcium: 9.4 mg/dL (ref 8.4–10.5)
Chloride: 103 mEq/L (ref 96–112)
Creatinine, Ser: 1.06 mg/dL (ref 0.40–1.50)
GFR: 66.77 mL/min (ref >60.00)
Glucose, Bld: 128 mg/dL — ABNORMAL HIGH (ref 70–99)
Potassium: 5 mEq/L (ref 3.5–5.1)
Sodium: 139 mEq/L (ref 135–145)
Total Bilirubin: 0.7 mg/dL (ref 0.2–1.2)
Total Protein: 6.8 g/dL (ref 6.0–8.3)

## 2021-12-07 LAB — LIPID PANEL
Cholesterol: 115 mg/dL (ref 0–200)
HDL: 25.7 mg/dL — ABNORMAL LOW (ref >39.00)
LDL Cholesterol: 59 mg/dL (ref 0–99)
NonHDL: 89.38
Total CHOL/HDL Ratio: 4
Triglycerides: 151 mg/dL — ABNORMAL HIGH (ref 0.0–149.0)
VLDL: 30.2 mg/dL (ref 0.0–40.0)

## 2021-12-07 LAB — CBC WITH DIFFERENTIAL/PLATELET
Basophils Absolute: 0 10*3/uL (ref 0.0–0.1)
Basophils Relative: 0.7 % (ref 0.0–3.0)
Eosinophils Absolute: 0.1 10*3/uL (ref 0.0–0.7)
Eosinophils Relative: 2.2 % (ref 0.0–5.0)
HCT: 32.3 % — ABNORMAL LOW (ref 39.0–52.0)
Hemoglobin: 10.6 g/dL — ABNORMAL LOW (ref 13.0–17.0)
Lymphocytes Relative: 28.7 % (ref 12.0–46.0)
Lymphs Abs: 1.9 10*3/uL (ref 0.7–4.0)
MCHC: 32.8 g/dL (ref 30.0–36.0)
MCV: 88.6 fl (ref 78.0–100.0)
Monocytes Absolute: 0.5 10*3/uL (ref 0.1–1.0)
Monocytes Relative: 8 % (ref 3.0–12.0)
Neutro Abs: 4 10*3/uL (ref 1.4–7.7)
Neutrophils Relative %: 60.4 % (ref 43.0–77.0)
Platelets: 287 10*3/uL (ref 150.0–400.0)
RBC: 3.65 Mil/uL — ABNORMAL LOW (ref 4.22–5.81)
RDW: 17 % — ABNORMAL HIGH (ref 11.5–15.5)
WBC: 6.7 10*3/uL (ref 4.0–10.5)

## 2021-12-07 NOTE — Progress Notes (Signed)
? ? Patient ID: [name] Young., male    DOB: 1941/05/17, 81 y.o.   MRN: [id number] ? ?This visit was conducted in person. ? ?BP 122/72   Pulse (!) 57   Temp 97.8 ?F (36.6 ?C) (Temporal)   Ht 5' 11"  (1.803 m)   Wt 235 lb 4 oz (106.7 kg)   SpO2 99%   BMI 32.81 kg/m?   ? ?CC: 76-monthfollow-up visit, hospital follow-up ?Subjective:  ? ?HPI: ?[name] Young is a 81y.o. male presenting on 12/07/2021 for Follow-up (Here for 6 mo f/u. Pt accompanied by wife, CBarnetta Chapel ) ? ? ?Recent hospitalization last month for 2 weeks with ICU stay due to ascending cholangitis with bacteremia status post ERCP with gallstone extraction.  This was complicated by mitral valve endocarditis.  Cultures grew Streptococcus infantarius and E. coli.  ID and GI were consulted.  He was treated with IV cefazolin through PICC line for 6 weeks-this ended on Monday.  PICC line was subsequently removed-this was managed by ID. He was discharged to SNF and came home on 11/24/2021.  ?Acute illness also complicated by acute systolic congestive heart failure with a drop in ejection fraction down to 45%-he was seen by cardiology and was placed on beta-blocker, FWilder Glade and Entresto. ?Atrial fibrillation was managed with beta-blocker metoprolol and Eliquis. ? ?Hospital records reviewed. Med rec performed - new meds eliquis, entresto, farxiga and toprol XL, and flomax ?He's lost 16 lbs since last seen 05/2022.  ?He continues regular incentive spirometer use ? ?He's had yeast infection x2 (in hospital and again since coming home from SNF) - currently treating with miconazole with benefit.  ? ?Since home, remains fatigued/deconditioned. No dyspnea, chest pain, leg swelling. No headache or dizziness or palpitations.  ? ?DM - now off novolog - did restart metformin 5051myesterday.  ? ?[facility]. Having [facility] PT, SN (finished yesterday).  ? ?He has seen Dr. WaJuleen Chinaf infectious diseases outpatient setting and is  scheduled to see Dr. EnSaunders Revelf cardiology in 2 weeks.  ? ?S/p 3v CABG and coronary endarterectomy 01/2018 for severe symptomatic CAD. ? ?Requests gen surgery referral to discuss nonurgent elective cholecystectomy.  ?____________________________________________________________________ ? ?Admission date: 10/23/2021 ?Discharge date: 11/05/2021 ?TCM hosp f/u phone call not performed  ? ?Discharge Condition:Stable ?CODE STATUS:DNR ?Diet recommendation: Heart Healthy  ? ?Discharge Diagnoses:  ?Principal Problem: ?  Bacteremia due to Streptococcus ?Active Problems: ?  Coronary artery disease ?  [place]rial fibrillation ([facility]?  Sepsis ([place]?  Calculus of bile duct with cholangitis and obstruction ?  Jaundice ?  Elevated LFTs ?  Hyperbilirubinemia ?  Bacteremia due to Escherichia coli ?  Palliative care by specialist ?   ? ?Relevant past medical, surgical, family and social history reviewed and updated as indicated. Interim medical history since our last visit reviewed. ?Allergies and medications reviewed and updated. ?Outpatient Medications Prior to Visit  ?Medication Sig Dispense Refill  ? ACCU-CHEK AVIVA PLUS test strip USE AS DIRECTED TO CHECK BLOOD SUGARS UPTO FOUR TIMES DAILY. 100 each 3  ? apixaban (ELIQUIS) 5 MG TABS tablet Take 1 tablet (5 mg total) by mouth 2 (two) times daily. 60 tablet   ? blood glucose meter kit and supplies Dispense based on patient and insurance preference. Use up to four times daily as directed. (FOR ICD-10 E10.9, E11.9). 1 each 0  ? Cholecalciferol (VITAMIN D3) 25 MCG (1000 UT) CAPS Take 1 capsule (1,000 Units total) by mouth  daily. 30 capsule   ? Cyanocobalamin (B-12) 1000 MCG SUBL Place 1 tablet under the tongue daily.    ? dapagliflozin propanediol (FARXIGA) 5 MG TABS tablet Take 1 tablet (5 mg total) by mouth daily. 30 tablet   ? fenofibrate (TRICOR) 145 MG tablet Take 1 tablet (145 mg total) by mouth daily. 90 tablet 3  ? metFORMIN (GLUCOPHAGE) 500 MG tablet Take by mouth daily with breakfast.     ? metoprolol succinate (TOPROL-XL) 50 MG 24 hr tablet Take 1 tablet (50 mg total) by mouth daily. Take with or immediately following a meal.    ? Multiple Vitamins-Minerals (MACULAR VITAMIN BENEFIT PO) Take 1 tablet by mouth daily.    ? nitroGLYCERIN (NITROSTAT) 0.4 MG SL tablet Place 1 tablet (0.4 mg total) under the tongue every 5 (five) minutes as needed for chest pain. 25 tablet 4  ? Omega-3 Fatty Acids (FISH OIL) 1200 MG CAPS Take 2 capsules (2,400 mg total) by mouth daily.    ? omeprazole (PRILOSEC) 40 MG capsule Take 1 capsule (40 mg total) by mouth every Monday, Wednesday, and Friday. 40 capsule 3  ? rosuvastatin (CRESTOR) 10 MG tablet TAKE 1 TABLET BY MOUTH ONCE A DAY (Patient taking differently: Take 10 mg by mouth daily.) 90 tablet 0  ? sacubitril-valsartan (ENTRESTO) 24-26 MG Take 1 tablet by mouth 2 (two) times daily. 60 tablet   ? tamsulosin (FLOMAX) 0.4 MG CAPS capsule Take 1 capsule (0.4 mg total) by mouth daily. 30 capsule   ? ceFAZolin (ANCEF) IVPB Inject 2 g into the vein every 8 (eight) hours. Indication:  MV endocarditis ?First Dose: Yes ?Last Day of Therapy:  12/05/21 ?Labs - Once weekly:  CBC/D and BMP, ?Labs - Every other week:  ESR and CRP ?Method of administration: IV Push ?Method of administration may be changed at the discretion of home infusion pharmacist based upon assessment of the patient and/or caregiver's ability to self-administer the medication ordered. 100 Units 0  ? insulin aspart (NOVOLOG) 100 UNIT/ML injection Inject 0-15 Units into the skin 3 (three) times daily with meals. (Patient not taking: Reported on 12/07/2021) 10 mL 11  ? ?No facility-administered medications prior to visit.  ?  ? ?Per HPI unless specifically indicated in ROS section below ?Review of Systems ? ?Objective:  ?BP 122/72   Pulse (!) 57   Temp 97.8 ?F (36.6 ?C) (Temporal)   Ht 5' 11"  (1.803 m)   Wt 235 lb 4 oz (106.7 kg)   SpO2 99%   BMI 32.81 kg/m?   ?Wt Readings from Last 3 Encounters:  ?12/07/21  235 lb 4 oz (106.7 kg)  ?11/17/21 243 lb (110.2 kg)  ?11/03/21 279 lb 1.6 oz (126.6 kg)  ?  ?  ?Physical Exam ?Vitals and nursing note reviewed.  ?Constitutional:   ?   Appearance: Normal appearance. He is not ill-appearing.  ?   Comments: Ambulates with cane  ?HENT:  ?   Mouth/Throat:  ?   Mouth: Mucous membranes are moist.  ?   Pharynx: Oropharynx is clear. No oropharyngeal exudate or posterior oropharyngeal erythema.  ?Eyes:  ?   Extraocular Movements: Extraocular movements intact.  ?   Pupils: Pupils are equal, round, and reactive to light.  ?Cardiovascular:  ?   Rate and Rhythm: Normal rate. Rhythm irregular.  ?   Pulses: Normal pulses.  ?   Heart sounds: Normal heart sounds. No murmur heard. ?Pulmonary:  ?   Effort: Pulmonary effort is normal. No respiratory distress.  ?  Breath sounds: Normal breath sounds. No wheezing, rhonchi or rales.  ?Abdominal:  ?   General: Bowel sounds are normal.  ?   Palpations: Abdomen is soft. There is no mass.  ?   Tenderness: There is no abdominal tenderness. There is no guarding or rebound.  ?Musculoskeletal:  ?   Right lower leg: No edema.  ?   Left lower leg: No edema.  ?Skin: ?   General: Skin is warm and dry.  ?   Findings: No rash.  ?Neurological:  ?   Mental Status: He is alert.  ?Psychiatric:     ?   Mood and Affect: Mood normal.     ?   Behavior: Behavior normal.  ? ?   ?Lab Results  ?Component Value Date  ? HGBA1C 6.1 (H) 10/23/2021  ?  ?Results for orders placed or performed in visit on 12/07/21  ?Comprehensive metabolic panel  ?Result Value Ref Range  ? Sodium 139 135 - 145 mEq/L  ? Potassium 5.0 3.5 - 5.1 mEq/L  ? Chloride 103 96 - 112 mEq/L  ? CO2 27 19 - 32 mEq/L  ? Glucose, Bld 128 (H) 70 - 99 mg/dL  ? BUN 25 (H) 6 - 23 mg/dL  ? Creatinine, Ser 1.06 0.40 - 1.50 mg/dL  ? Total Bilirubin 0.7 0.2 - 1.2 mg/dL  ? Alkaline Phosphatase 56 39 - 117 U/L  ? AST 13 0 - 37 U/L  ? ALT 3 0 - 53 U/L  ? Total Protein 6.8 6.0 - 8.3 g/dL  ? Albumin 3.6 3.5 - 5.2 g/dL  ? GFR 66.77  >60.00 mL/min  ? Calcium 9.4 8.4 - 10.5 mg/dL  ?CBC with Differential/Platelet  ?Result Value Ref Range  ? WBC 6.7 4.0 - 10.5 K/uL  ? RBC 3.65 (L) 4.22 - 5.81 Mil/uL  ? Hemoglobin 10.6 (L) 13.0 - 17.0 g/dL  ? HC

## 2021-12-07 NOTE — Patient Instructions (Addendum)
Labs today  ?Good to see you today. ?You are recovering well. ?Keep appointment with Dr End in 2 weeks, touch base with them about the farxiga. However let us know if any recurrent yeast or new bladder infections.  ?Return in 3 months for diabetes follow up visit.  ?

## 2021-12-08 ENCOUNTER — Encounter: Payer: Self-pay | Admitting: Family Medicine

## 2021-12-08 NOTE — Assessment & Plan Note (Signed)
Recent hospitalization for ascending cholangitis due to choledocholithiasis complicated by sepsis. He did have SNF stay for rehab after hospitalization and is now back home, recovering well.  ?Will refer to GI and gen surgery for outpatient follow up of strep infantarius bacteremia as well as discussion on cholecystectomy.  ?

## 2021-12-08 NOTE — Assessment & Plan Note (Addendum)
Evidence of systolic CHF on latest echo, now on entresto and farxiga. They note yeast infection since farxiga started, they will touch base with cardiology if recurrent.  ?Has short term cardiology f/u planned  ?

## 2021-12-08 NOTE — Assessment & Plan Note (Signed)
Significant weight loss while acutely ill.  ?

## 2021-12-08 NOTE — Assessment & Plan Note (Signed)
Completed 6 wks of cefazolin through PICC, line removed earlier this week. Appreciate ID care.  ?

## 2021-12-08 NOTE — Assessment & Plan Note (Signed)
Acute on chronic kidney disease while hospitalized, now back to baseline.  ?

## 2021-12-08 NOTE — Assessment & Plan Note (Addendum)
New onset - now on eliquis, has cardiology f/u planned.  ?

## 2021-12-08 NOTE — Assessment & Plan Note (Signed)
Continue vitamin B12 1000 mcg daily.

## 2021-12-08 NOTE — Assessment & Plan Note (Addendum)
Managed with rapid acting insulin while hospitalized, now off this, recently started metformin '500mg'$  once daily. He also continues farxiga started while hospitalized.  ?Good control recently. Not checking sugars. Will continue for now.  ?

## 2021-12-21 ENCOUNTER — Ambulatory Visit: Payer: Medicare PPO | Admitting: Internal Medicine

## 2021-12-21 ENCOUNTER — Encounter: Payer: Self-pay | Admitting: Internal Medicine

## 2021-12-21 VITALS — BP 100/56 | HR 74 | Ht 71.0 in | Wt 235.0 lb

## 2021-12-21 DIAGNOSIS — E785 Hyperlipidemia, unspecified: Secondary | ICD-10-CM

## 2021-12-21 DIAGNOSIS — I2510 Atherosclerotic heart disease of native coronary artery without angina pectoris: Secondary | ICD-10-CM

## 2021-12-21 DIAGNOSIS — I480 Paroxysmal atrial fibrillation: Secondary | ICD-10-CM | POA: Diagnosis not present

## 2021-12-21 DIAGNOSIS — K8309 Other cholangitis: Secondary | ICD-10-CM | POA: Diagnosis not present

## 2021-12-21 DIAGNOSIS — I5020 Unspecified systolic (congestive) heart failure: Secondary | ICD-10-CM

## 2021-12-21 DIAGNOSIS — I4891 Unspecified atrial fibrillation: Secondary | ICD-10-CM

## 2021-12-21 DIAGNOSIS — E1169 Type 2 diabetes mellitus with other specified complication: Secondary | ICD-10-CM

## 2021-12-21 DIAGNOSIS — R490 Dysphonia: Secondary | ICD-10-CM | POA: Diagnosis not present

## 2021-12-21 DIAGNOSIS — I330 Acute and subacute infective endocarditis: Secondary | ICD-10-CM

## 2021-12-21 DIAGNOSIS — I10 Essential (primary) hypertension: Secondary | ICD-10-CM | POA: Diagnosis not present

## 2021-12-21 DIAGNOSIS — I5022 Chronic systolic (congestive) heart failure: Secondary | ICD-10-CM

## 2021-12-21 MED ORDER — METOPROLOL SUCCINATE ER 50 MG PO TB24: 50.0000 mg | ORAL_TABLET | Freq: Every day | ORAL | 3 refills | Status: DC

## 2021-12-21 MED ORDER — SACUBITRIL-VALSARTAN 24-26 MG PO TABS: 1.0000 | ORAL_TABLET | Freq: Two times a day (BID) | ORAL | 3 refills | Status: DC

## 2021-12-21 MED ORDER — DAPAGLIFLOZIN PROPANEDIOL 5 MG PO TABS: 5.0000 mg | ORAL_TABLET | Freq: Every day | ORAL | 3 refills | Status: DC

## 2021-12-21 MED ORDER — APIXABAN 5 MG PO TABS: 5.0000 mg | ORAL_TABLET | Freq: Two times a day (BID) | ORAL | 3 refills | Status: DC

## 2021-12-21 MED ORDER — FENOFIBRATE 145 MG PO TABS: 145.0000 mg | ORAL_TABLET | Freq: Every day | ORAL | 3 refills | Status: DC

## 2021-12-21 NOTE — Patient Instructions (Signed)
Medication Instructions:  ? ?Your physician recommends that you continue on your current medications as directed. Please refer to the Current Medication list given to you today. ? ?*If you need a refill on your cardiac medications before your next appointment, please call your pharmacy* ? ? ?Lab Work: ? ?None ordered ? ?Testing/Procedures: ? ?Your physician has requested that you have an echocardiogram. Echocardiography is a painless test that uses sound waves to create images of your heart. It provides your doctor with information about the size and shape of your heart and how well your heart?s chambers and valves are working. This procedure takes approximately one hour. There are no restrictions for this procedure. ? ? ?Follow-Up: ?At [facility], you and your health needs are our priority.  As part of our continuing mission to provide you with exceptional heart care, we have created designated Provider Care Teams.  These Care Teams include your primary Cardiologist (physician) and Advanced Practice Providers (APPs -  Physician Assistants and Nurse Practitioners) who all work together to provide you with the care you need, when you need it. ? ?We recommend signing up for the patient portal called "MyChart".  Sign up information is provided on this After Visit Summary.  MyChart is used to connect with patients for Virtual Visits (Telemedicine).  Patients are able to view lab/test results, encounter notes, upcoming appointments, etc.  Non-urgent messages can be sent to your provider as well.   ?To learn more about what you can do with MyChart, go to [email or web address].   ? ?Your next appointment:   ?3 month(s) ? ?The format for your next appointment:   ?In Person ? ?Provider:   ?You may see Nelva Bush, MD or one of the following Advanced Practice Providers on your designated Care Team:   ?Murray Hodgkins, NP ?Christell Faith, PA-C ?Cadence Kathlen Mody, PA-C  ? ? ?Other Instructions ? ?You have been referred to  [place] [facility] Th[facility] for evaluation of hoarseness. Their office will call you to schedule an appointment. Their number is [phone]. ? ?Important Information About Sugar ? ? ? ? ? ? ?

## 2021-12-21 NOTE — Progress Notes (Signed)
? ?Follow-up Outpatient Visit ?Date: 12/21/2021 ? ?Primary Care Provider: ?Ria Bush, MD ?[address] ?[place] [id number] ? ?Chief Complaint: Follow-up heart failure, endocarditis, and coronary artery disease ? ?HPI:  [name] Young is a 81 y.o. male with history of three-vessel CAD status post CABG (12/345) complicated by postoperative atrial fibrillation, ascending cholangitis complicated by recurrent atrial fibrillation, acute HFrEF (LVEF 40-45%), and mitral valve endocarditis, as well as hypertension, hyperlipidemia, type 2 diabetes mellitus, and chronic kidney disease stage III, who presents for follow-up of CAD, cardiomyopathy, and endocarditis.  He was hospitalized with in February with abdominal pain.  He was found to have polymicrobial bacteremia secondary to a sending cholangitis complicated by endocarditis with a small vegetation on his mitral valve.  He also had acute decline in LVEF to 40-45% and paroxysmal atrial fibrillation.  He was discharged on metoprolol and apixaban as well as 6 weeks of IV antibiotics (completed 12/05/2021). ? ?Today, [name] Young reports that he is slowly building back his strength.  Following his hospitalization at [facility] in February, he spent a few weeks in rehab.  He has been back at home for almost a month now.  He participated in physical therapy but is still not back to his baseline strength.  He is also concerned about difficulty sleeping.  He often takes 2 Tylenol PM's and melatonin to help him sleep.  He denies chest pain, shortness of breath, palpitations, lightheadedness, and edema.  He inquires about coming off apixaban, as he feels like it is making him feel cold.  He also reports that his voice has been hoarse ever since his TEE in February.  He does not have any difficulty swallowing. ? ?-------------------------------------------------------------------------------------------------- ? ?Past Medical History:  ?Diagnosis Date  ? Arthritis   ?  Ascending cholangitis 10/2021  ? Cataract   ? lens implant bilateral  ? Chronic kidney disease   ? kidney stones  ? Coronary artery disease   ? Dyspnea   ? Dysrhythmia   ? Endocarditis of mitral valve 10/2021  ? In setting of bacteremia from ascending colangitis  ? GERD (gastroesophageal reflux disease)   ? HFrEF (heart failure with reduced ejection fraction) ([place]) 10/2021  ? LVEF 40-45%  ? History of cholelithiasis   ? History of kidney stones   ? ca ox [name]) now [place]  ? History of pneumonia   ? HLD (hyperlipidemia)   ? HTN (hypertension)   ? Jaundice   ? age 42  ? Paroxysmal atrial fibrillation (HCC)   ? Pneumonia   ? years ago   ? T2DM (type 2 diabetes mellitus) ([place]) 2010  ? ?Past Surgical History:  ?Procedure Laterality Date  ? CARPAL TUNNEL RELEASE Bilateral   ? CATARACT EXTRACTION, BILATERAL    ? COLONOSCOPY  11/2012  ? 11 adenomatous polyps, diverticulosis, rec rpt 1 yr Ardis Hughs)  ? COLONOSCOPY  12/2013  ? 3 polyps, diverticulosis, rec rpt 3 yrs Ardis Hughs)  ? COLONOSCOPY  06/2019  ? 6 polyps (TA), diverticulosis, f/u left open ended Ardis Hughs)  ? CORONARY ARTERY BYPASS GRAFT N/A 01/18/2018  ? Procedure: CORONARY ARTERY BYPASS GRAFTING (CABG) x 3; Using Left Internal Mammary Artery, and Right Greater Saphenous Vein harvested Endoscopically, Coronary Artery Endarterectomy;  Surgeon: Ivin Poot, MD;  Location: [facility];  Service: Open Heart Surgery;  Laterality: N/A;  ? ERCP N/A 10/23/2021  ? Procedure: ENDOSCOPIC RETROGRADE CHOLANGIOPANCREATOGRAPHY (ERCP);  Surgeon: Ladene Artist, MD;  Location: [facility] ENDOSCOPY;  Service: Endoscopy;  Laterality: N/A;  ?  KNEE CARTILAGE SURGERY Left   ? LEFT HEART CATH AND CORONARY ANGIOGRAPHY N/A 12/21/2017  ? Procedure: LEFT HEART CATH AND CORONARY ANGIOGRAPHY;  Surgeon: Nelva Bush, MD;  Location: [facility] CV LAB;  Service: Cardiovascular;  Laterality: N/A;  ? LITHOTRIPSY    ? REMOVAL OF STONES  10/23/2021  ? Procedure: REMOVAL OF STONES;  Surgeon: Ladene Artist, MD;  Location: [facility];  Service: Endoscopy;;  ? SPHINCTEROTOMY  10/23/2021  ? Procedure: SPHINCTEROTOMY;  Surgeon: Ladene Artist, MD;  Location: [facility] ENDOSCOPY;  Service: Endoscopy;;  ? TEE WITHOUT CARDIOVERSION N/A 01/18/2018  ? Procedure: TRANSESOPHAGEAL ECHOCARDIOGRAM (TEE);  Surgeon: Prescott Gum, Collier Salina, MD;  Location: [facility];  Service: Open Heart Surgery;  Laterality: N/A;  ? TEE WITHOUT CARDIOVERSION N/A 10/28/2021  ? Procedure: TRANSESOPHAGEAL ECHOCARDIOGRAM (TEE);  Surgeon: Pixie Casino, MD;  Location: [facility] ENDOSCOPY;  Service: Cardiovascular;  Laterality: N/A;  ? UMBILICAL HERNIA REPAIR    ? with mesh  ? ? ?Current Meds  ?Medication Sig  ? ACCU-CHEK AVIVA PLUS test strip USE AS DIRECTED TO CHECK BLOOD SUGARS UPTO FOUR TIMES DAILY.  ? blood glucose meter kit and supplies Dispense based on patient and insurance preference. Use up to four times daily as directed. (FOR ICD-10 E10.9, E11.9).  ? Cholecalciferol (VITAMIN D3) 25 MCG (1000 UT) CAPS Take 1 capsule (1,000 Units total) by mouth daily.  ? Cyanocobalamin (B-12) 1000 MCG SUBL Place 1 tablet under the tongue daily.  ? metFORMIN (GLUCOPHAGE) 500 MG tablet Take by mouth daily with breakfast.  ? Multiple Vitamins-Minerals (MACULAR VITAMIN BENEFIT PO) Take 1 tablet by mouth daily.  ? nitroGLYCERIN (NITROSTAT) 0.4 MG SL tablet Place 1 tablet (0.4 mg total) under the tongue every 5 (five) minutes as needed for chest pain.  ? Omega-3 Fatty Acids (FISH OIL) 1200 MG CAPS Take 2 capsules (2,400 mg total) by mouth daily.  ? omeprazole (PRILOSEC) 40 MG capsule Take 1 capsule (40 mg total) by mouth every Monday, Wednesday, and Friday.  ? rosuvastatin (CRESTOR) 10 MG tablet TAKE 1 TABLET BY MOUTH ONCE A DAY  ? tamsulosin (FLOMAX) 0.4 MG CAPS capsule Take 1 capsule (0.4 mg total) by mouth daily.  ? [DISCONTINUED] apixaban (ELIQUIS) 5 MG TABS tablet Take 1 tablet (5 mg total) by mouth 2 (two) times daily.  ? [DISCONTINUED] dapagliflozin propanediol (FARXIGA)  5 MG TABS tablet Take 1 tablet (5 mg total) by mouth daily.  ? [DISCONTINUED] fenofibrate (TRICOR) 145 MG tablet Take 1 tablet (145 mg total) by mouth daily.  ? [DISCONTINUED] metoprolol succinate (TOPROL-XL) 50 MG 24 hr tablet Take 1 tablet (50 mg total) by mouth daily. Take with or immediately following a meal.  ? [DISCONTINUED] sacubitril-valsartan (ENTRESTO) 24-26 MG Take 1 tablet by mouth 2 (two) times daily.  ? ? ?Allergies: Patient has no known allergies. ? ?Social History  ? ?Tobacco Use  ? Smoking status: Never  ? Smokeless tobacco: Former  ?  Types: Chew  ?  Quit date: 09/08/2001  ?Vaping Use  ? Vaping Use: Never used  ?Substance Use Topics  ? Alcohol use: Not Currently  ?  Comment: occ  ? Drug use: No  ? ? ?Family History  ?Problem Relation Age of Onset  ? Alzheimer's disease Mother   ? Breast cancer Mother   ?     breast  ? Atrial fibrillation Mother   ? CAD Mother   ? Stroke Father 67  ? Diabetes Father   ? CAD Father   ?  Colon polyps Sister   ? Colon polyps Brother   ? Rectal cancer Maternal Grandfather   ?     rectal  ? Colon cancer Maternal Grandfather 54  ? Esophageal cancer Neg Hx   ? Stomach cancer Neg Hx   ? ? ?Review of Systems: ?A 12-system review of systems was performed and was negative except as noted in the HPI. ? ?-------------------------------------------------------------------------------------------------- ? ?Physical Exam: ?BP (!) 100/56 (BP Location: Left Arm, Patient Position: Sitting, Cuff Size: Large)   Pulse 74   Ht _0  (1.803 m)   Wt 235 lb (106.6 kg)   SpO2 99%   BMI 32.78 kg/m?  ? ?General:  NAD. ?Neck: No JVD or HJR. ?Lungs: Clear to auscultation bilaterally without wheezes or crackles. ?Heart: Regular rate and rhythm without murmurs, rubs, or gallops. ?Abdomen: Soft, nontender, nondistended. ?Extremities: No lower extremity edema. ? ?EKG: Normal sinus rhythm without abnormality. ? ?Lab Results  ?Component Value Date  ? WBC 6.7 12/07/2021  ? HGB 10.6 (L) 12/07/2021   ? HCT 32.3 (L) 12/07/2021  ? MCV 88.6 12/07/2021  ? PLT 287.0 12/07/2021  ? ? ?Lab Results  ?Component Value Date  ? NA 139 12/07/2021  ? K 5.0 12/07/2021  ? CL 103 12/07/2021  ? CO2 27 12/07/2021  ? BUN 25 (H) 0

## 2021-12-22 ENCOUNTER — Other Ambulatory Visit: Payer: Self-pay | Admitting: Family Medicine

## 2021-12-22 ENCOUNTER — Encounter: Payer: Self-pay | Admitting: Internal Medicine

## 2021-12-22 ENCOUNTER — Encounter: Payer: Self-pay | Admitting: *Deleted

## 2021-12-22 ENCOUNTER — Other Ambulatory Visit: Payer: Self-pay | Admitting: Internal Medicine

## 2021-12-22 DIAGNOSIS — R490 Dysphonia: Secondary | ICD-10-CM | POA: Insufficient documentation

## 2021-12-22 NOTE — Telephone Encounter (Signed)
Erroneous encounter

## 2021-12-23 NOTE — Telephone Encounter (Signed)
ERx 

## 2021-12-23 NOTE — Telephone Encounter (Signed)
Refill request Flomax ?Last prescribed 11/05/21 #30 Dr. Waldron [facility] ? ? ?

## 2022-01-02 ENCOUNTER — Ambulatory Visit (INDEPENDENT_AMBULATORY_CARE_PROVIDER_SITE_OTHER): Payer: Medicare PPO | Admitting: Podiatry

## 2022-01-02 ENCOUNTER — Encounter: Payer: Self-pay | Admitting: Podiatry

## 2022-01-02 DIAGNOSIS — E1122 Type 2 diabetes mellitus with diabetic chronic kidney disease: Secondary | ICD-10-CM | POA: Diagnosis not present

## 2022-01-02 DIAGNOSIS — N183 Chronic kidney disease, stage 3 unspecified: Secondary | ICD-10-CM

## 2022-01-02 DIAGNOSIS — M79675 Pain in left toe(s): Secondary | ICD-10-CM | POA: Diagnosis not present

## 2022-01-02 DIAGNOSIS — B351 Tinea unguium: Secondary | ICD-10-CM

## 2022-01-02 DIAGNOSIS — M79674 Pain in right toe(s): Secondary | ICD-10-CM | POA: Diagnosis not present

## 2022-01-02 DIAGNOSIS — E1142 Type 2 diabetes mellitus with diabetic polyneuropathy: Secondary | ICD-10-CM | POA: Diagnosis not present

## 2022-01-02 NOTE — Progress Notes (Signed)
This patient returns to my office for at risk foot care.  This patient requires this care by a professional since this patient will be at risk due to having diabetres and neuropathy.  Patient presents to the office with her husband.  This patient is unable to cut nails himself since the patient cannot reach his nails.These nails are painful walking and wearing shoes.  This patient presents for at risk foot care today.  General Appearance  Alert, conversant and in no acute stress.  Vascular  Dorsalis pedis and posterior tibial  pulses are weakly  palpable  bilaterally.  Capillary return is within normal limits  bilaterally. Cold feet  Bilaterally. Absent digital hair.  Neurologic  Senn-Weinstein monofilament wire test absent  bilaterally. Muscle power within normal limits bilaterally.  Nails Thick disfigured discolored nails with subungual debris  from hallux to fifth toes bilaterally. No evidence of bacterial infection or drainage bilaterally.  Orthopedic  No limitations of motion  feet .  No crepitus or effusions noted.  No bony pathology or digital deformities noted.  Skin  normotropic skin with no porokeratosis noted bilaterally.  No signs of infections or ulcers noted.     Onychomycosis  Pain in right toes  Pain in left toes  Consent was obtained for treatment procedures.   Mechanical debridement of nails 1-5  bilaterally performed with a nail nipper.  Filed with dremel without incident.    Return office visit   12 weeks                 Told patient to return for periodic foot care and evaluation due to potential at risk complications.   Gillie Fleites DPM  

## 2022-01-08 ENCOUNTER — Other Ambulatory Visit: Payer: Self-pay | Admitting: Internal Medicine

## 2022-01-11 ENCOUNTER — Ambulatory Visit (INDEPENDENT_AMBULATORY_CARE_PROVIDER_SITE_OTHER): Payer: Medicare PPO

## 2022-01-11 DIAGNOSIS — I5020 Unspecified systolic (congestive) heart failure: Secondary | ICD-10-CM

## 2022-01-11 LAB — ECHOCARDIOGRAM COMPLETE
AR max vel: 1.82 cm2
AV Area VTI: 1.89 cm2
AV Area mean vel: 1.64 cm2
AV Mean grad: 8.5 mmHg
AV Peak grad: 14.4 mmHg
Ao pk vel: 1.9 m/s
Area-P 1/2: 2.5 cm2
Calc EF: 52.1 %
MV VTI: 2.54 cm2
S' Lateral: 4.1 cm
Single Plane A2C EF: 52.6 %
Single Plane A4C EF: 54.7 %

## 2022-03-02 DIAGNOSIS — J3801 Paralysis of vocal cords and larynx, unilateral: Secondary | ICD-10-CM | POA: Diagnosis not present

## 2022-03-10 ENCOUNTER — Ambulatory Visit: Payer: Medicare PPO | Admitting: Family Medicine

## 2022-03-24 ENCOUNTER — Encounter: Payer: Self-pay | Admitting: Family Medicine

## 2022-03-24 ENCOUNTER — Ambulatory Visit: Payer: Medicare PPO | Admitting: Family Medicine

## 2022-03-24 VITALS — BP 122/68 | HR 59 | Temp 97.3°F | Ht 71.0 in | Wt 240.0 lb

## 2022-03-24 DIAGNOSIS — I5022 Chronic systolic (congestive) heart failure: Secondary | ICD-10-CM

## 2022-03-24 DIAGNOSIS — E1169 Type 2 diabetes mellitus with other specified complication: Secondary | ICD-10-CM

## 2022-03-24 DIAGNOSIS — G6289 Other specified polyneuropathies: Secondary | ICD-10-CM

## 2022-03-24 DIAGNOSIS — E118 Type 2 diabetes mellitus with unspecified complications: Secondary | ICD-10-CM | POA: Diagnosis not present

## 2022-03-24 DIAGNOSIS — I480 Paroxysmal atrial fibrillation: Secondary | ICD-10-CM

## 2022-03-24 DIAGNOSIS — R5381 Other malaise: Secondary | ICD-10-CM

## 2022-03-24 DIAGNOSIS — R490 Dysphonia: Secondary | ICD-10-CM | POA: Diagnosis not present

## 2022-03-24 LAB — POCT GLYCOSYLATED HEMOGLOBIN (HGB A1C): Hemoglobin A1C: 6.1 % — AB (ref 4.0–5.6)

## 2022-03-24 NOTE — Progress Notes (Signed)
Patient ID: [name] Young., male    DOB: 08-18-1941, 81 y.o.   MRN: [id number]  This visit was conducted in person.  BP 122/68   Pulse (!) 59   Temp (!) 97.3 F (36.3 C) (Temporal)   Ht _0  (1.803 m)   Wt 240 lb (108.9 kg)   SpO2 99%   BMI 33.47 kg/m    CC: DM f/u visit  Subjective:   HPI: [name] Young. is a 81 y.o. male presenting on 03/24/2022 for Diabetes (Here for f/u.)   Hospitalization early this year for ascending cholangitis with bacteremia s/p ERCP with gallstone extraction, complicated by afib and MV endocarditis. Received 6 wks of IV antibiotics via PICC line.   Has not seen gen surg yet - told heart not strong enough. Has upcoming appt next week with Dr End.   Hoarseness since TEE - permanent hoarseness, easily loses voice. Not interested in speech therapy.   Notes ongoing leg weakness, no muscle pain.  No fevers/chills, chest pain dyspnea or abdominal pain.   DM - does not regularly check sugars. Compliant with antihyperglycemic regimen which includes: farxiga 42m daily and metformin 5049mdaily. Denies low sugars or hypoglycemic symptoms. Denies paresthesias, blurry vision. Last diabetic eye exam 09/2021. Glucometer brand: accu-chek. Last foot exam: 09/2021. DSME: declines.  Lab Results  Component Value Date   HGBA1C 6.1 (A) 03/24/2022   Diabetic Foot Exam - Simple   No data filed    Lab Results  Component Value Date   MICROALBUR 1.7 05/26/2019         Relevant past medical, surgical, family and social history reviewed and updated as indicated. Interim medical history since our last visit reviewed. Allergies and medications reviewed and updated. Outpatient Medications Prior to Visit  Medication Sig Dispense Refill   ACCU-CHEK AVIVA PLUS test strip USE AS DIRECTED TO CHECK BLOOD SUGARS UPTO FOUR TIMES DAILY. 100 each 3   apixaban (ELIQUIS) 5 MG TABS tablet Take 1 tablet (5 mg total) by mouth 2 (two) times daily. 60 tablet 3   blood  glucose meter kit and supplies Dispense based on patient and insurance preference. Use up to four times daily as directed. (FOR ICD-10 E10.9, E11.9). 1 each 0   Cholecalciferol (VITAMIN D3) 25 MCG (1000 UT) CAPS Take 1 capsule (1,000 Units total) by mouth daily. 30 capsule    Cyanocobalamin (B-12) 1000 MCG SUBL Place 1 tablet under the tongue daily.     ENTRESTO 24-26 MG TAKE 1 TABLET BY MOUTH TWICE A DAY 60 tablet 3   FARXIGA 5 MG TABS tablet TAKE 1 TABLET BY MOUTH EVERY DAY 30 tablet 3   fenofibrate (TRICOR) 145 MG tablet Take 1 tablet (145 mg total) by mouth daily. 30 tablet 3   metFORMIN (GLUCOPHAGE) 500 MG tablet Take by mouth daily with breakfast.     metoprolol succinate (TOPROL-XL) 50 MG 24 hr tablet Take 1 tablet (50 mg total) by mouth daily. Take with or immediately following a meal. 30 tablet 3   Multiple Vitamins-Minerals (MACULAR VITAMIN BENEFIT PO) Take 1 tablet by mouth daily.     nitroGLYCERIN (NITROSTAT) 0.4 MG SL tablet Place 1 tablet (0.4 mg total) under the tongue every 5 (five) minutes as needed for chest pain. 25 tablet 4   Omega-3 Fatty Acids (FISH OIL) 1200 MG CAPS Take 2 capsules (2,400 mg total) by mouth daily.     omeprazole (PRILOSEC) 40 MG capsule Take 1 capsule (40 mg  total) by mouth every Monday, Wednesday, and Friday. 40 capsule 3   rosuvastatin (CRESTOR) 10 MG tablet TAKE 1 TABLET BY MOUTH EVERY DAY 90 tablet 0   tamsulosin (FLOMAX) 0.4 MG CAPS capsule TAKE 1 CAPSULE BY MOUTH EVERY DAY 30 capsule 6   No facility-administered medications prior to visit.     Per HPI unless specifically indicated in ROS section below Review of Systems  Objective:  BP 122/68   Pulse (!) 59   Temp (!) 97.3 F (36.3 C) (Temporal)   Ht _0  (1.803 m)   Wt 240 lb (108.9 kg)   SpO2 99%   BMI 33.47 kg/m   Wt Readings from Last 3 Encounters:  03/24/22 240 lb (108.9 kg)  12/21/21 235 lb (106.6 kg)  12/07/21 235 lb 4 oz (106.7 kg)      Physical Exam Vitals and nursing note  reviewed.  Constitutional:      Appearance: Normal appearance. He is not ill-appearing.  Eyes:     Extraocular Movements: Extraocular movements intact.     Conjunctiva/sclera: Conjunctivae normal.     Pupils: Pupils are equal, round, and reactive to light.  Cardiovascular:     Rate and Rhythm: Normal rate and regular rhythm.     Pulses: Normal pulses.     Heart sounds: Normal heart sounds. No murmur heard. Pulmonary:     Effort: Pulmonary effort is normal. No respiratory distress.     Breath sounds: Normal breath sounds. No wheezing, rhonchi or rales.  Musculoskeletal:     Right lower leg: No edema.     Left lower leg: No edema.     Comments: See HPI for foot exam if done  Skin:    General: Skin is warm and dry.     Findings: No rash.  Neurological:     Mental Status: He is alert.     Comments: Uses arms to stand up. 5/5 strength BLE except mild decreased strength noted to R>L hip flexors.  Psychiatric:        Mood and Affect: Mood normal.        Behavior: Behavior normal.       Results for orders placed or performed in visit on 03/24/22  POCT glycosylated hemoglobin (Hb A1C)  Result Value Ref Range   Hemoglobin A1C 6.1 (A) 4.0 - 5.6 %   HbA1c POC (<> result, manual entry)     HbA1c, POC (prediabetic range)     HbA1c, POC (controlled diabetic range)      Assessment & Plan:   Problem List Items Addressed This Visit     Type 2 diabetes mellitus with other specified complication ([place]) - Primary    Chronic, stable on current regimen of metformin and farxiga - continue this.  Declines DSME.       Peripheral neuropathy    H/o this - could contribute to leg weakness.       Debility    Predominantly leg weakness now.  Consider CPK next labs.  Declines outpatient PT referral.       Atrial fibrillation ([place])    Sounds regular today.       Chronic HFrEF (heart failure with reduced ejection fraction) ([place])    Appreciate cardiology care.       Hoarseness    Saw  ENT - told permante. Declines ST referral.         No orders of the defined types were placed in this encounter.  Orders Placed This Encounter  Procedures  POCT glycosylated hemoglobin (Hb A1C)     Patient Instructions  You are doing well today. Continue current medicines.  Consider outpatient physical therapy.  Good to see you today Return in 2-3 months for physical/wellness visit.   Follow up plan: Return in about 3 months (around 06/24/2022) for annual exam, prior fasting for blood work, medicare wellness visit.  Ria Bush, MD

## 2022-03-24 NOTE — Assessment & Plan Note (Signed)
Saw ENT - told permante. Declines ST referral.

## 2022-03-24 NOTE — Assessment & Plan Note (Signed)
Sounds regular today.  ?

## 2022-03-24 NOTE — Assessment & Plan Note (Signed)
H/o this - could contribute to leg weakness.

## 2022-03-24 NOTE — Patient Instructions (Addendum)
You are doing well today. Continue current medicines.  Consider outpatient physical therapy.  Good to see you today Return in 2-3 months for physical/wellness visit.

## 2022-03-24 NOTE — Assessment & Plan Note (Signed)
Predominantly leg weakness now.  Consider CPK next labs.  Declines outpatient PT referral.

## 2022-03-24 NOTE — Assessment & Plan Note (Signed)
Chronic, stable on current regimen of metformin and farxiga - continue this.  Declines DSME.

## 2022-03-24 NOTE — Assessment & Plan Note (Signed)
Appreciate cardiology care.  °

## 2022-03-27 ENCOUNTER — Encounter: Payer: Self-pay | Admitting: Podiatry

## 2022-03-27 ENCOUNTER — Ambulatory Visit (INDEPENDENT_AMBULATORY_CARE_PROVIDER_SITE_OTHER): Payer: Medicare PPO | Admitting: Podiatry

## 2022-03-27 DIAGNOSIS — E1122 Type 2 diabetes mellitus with diabetic chronic kidney disease: Secondary | ICD-10-CM | POA: Diagnosis not present

## 2022-03-27 DIAGNOSIS — M79674 Pain in right toe(s): Secondary | ICD-10-CM | POA: Diagnosis not present

## 2022-03-27 DIAGNOSIS — M79675 Pain in left toe(s): Secondary | ICD-10-CM

## 2022-03-27 DIAGNOSIS — N183 Chronic kidney disease, stage 3 unspecified: Secondary | ICD-10-CM | POA: Diagnosis not present

## 2022-03-27 DIAGNOSIS — E1142 Type 2 diabetes mellitus with diabetic polyneuropathy: Secondary | ICD-10-CM | POA: Diagnosis not present

## 2022-03-27 DIAGNOSIS — B351 Tinea unguium: Secondary | ICD-10-CM | POA: Diagnosis not present

## 2022-03-27 NOTE — Progress Notes (Signed)
This patient returns to my office for at risk foot care.  This patient requires this care by a professional since this patient will be at risk due to having diabetres and neuropathy.  Patient presents to the office with her husband.  This patient is unable to cut nails himself since the patient cannot reach his nails.These nails are painful walking and wearing shoes.  This patient presents for at risk foot care today.  General Appearance  Alert, conversant and in no acute stress.  Vascular  Dorsalis pedis and posterior tibial  pulses are weakly  palpable  bilaterally.  Capillary return is within normal limits  bilaterally. Cold feet  Bilaterally. Absent digital hair.  Neurologic  Senn-Weinstein monofilament wire test absent  bilaterally. Muscle power within normal limits bilaterally.  Nails Thick disfigured discolored nails with subungual debris  from hallux to fifth toes bilaterally. No evidence of bacterial infection or drainage bilaterally.  Orthopedic  No limitations of motion  feet .  No crepitus or effusions noted.  No bony pathology or digital deformities noted.  Skin  normotropic skin with no porokeratosis noted bilaterally.  No signs of infections or ulcers noted.     Onychomycosis  Pain in right toes  Pain in left toes  Consent was obtained for treatment procedures.   Mechanical debridement of nails 1-5  bilaterally performed with a nail nipper.  Filed with dremel without incident.    Return office visit   12 weeks                 Told patient to return for periodic foot care and evaluation due to potential at risk complications.   Kajsa Butrum DPM  

## 2022-03-29 ENCOUNTER — Ambulatory Visit: Payer: Medicare PPO | Admitting: Internal Medicine

## 2022-03-29 ENCOUNTER — Encounter: Payer: Self-pay | Admitting: Internal Medicine

## 2022-03-29 VITALS — BP 138/62 | HR 60 | Ht 70.0 in | Wt 241.0 lb

## 2022-03-29 DIAGNOSIS — E1169 Type 2 diabetes mellitus with other specified complication: Secondary | ICD-10-CM | POA: Diagnosis not present

## 2022-03-29 DIAGNOSIS — I480 Paroxysmal atrial fibrillation: Secondary | ICD-10-CM | POA: Diagnosis not present

## 2022-03-29 DIAGNOSIS — E785 Hyperlipidemia, unspecified: Secondary | ICD-10-CM

## 2022-03-29 DIAGNOSIS — I2510 Atherosclerotic heart disease of native coronary artery without angina pectoris: Secondary | ICD-10-CM

## 2022-03-29 DIAGNOSIS — I10 Essential (primary) hypertension: Secondary | ICD-10-CM | POA: Diagnosis not present

## 2022-03-29 DIAGNOSIS — I5022 Chronic systolic (congestive) heart failure: Secondary | ICD-10-CM

## 2022-03-29 DIAGNOSIS — K8080 Other cholelithiasis without obstruction: Secondary | ICD-10-CM | POA: Diagnosis not present

## 2022-03-29 DIAGNOSIS — R531 Weakness: Secondary | ICD-10-CM

## 2022-03-29 MED ORDER — METOPROLOL SUCCINATE ER 50 MG PO TB24: 50.0000 mg | ORAL_TABLET | Freq: Every day | ORAL | 5 refills | Status: DC

## 2022-03-29 NOTE — Patient Instructions (Signed)
Medication Instructions:   Your physician recommends that you continue on your current medications as directed. Please refer to the Current Medication list given to you today.  *If you need a refill on your cardiac medications before your next appointment, please call your pharmacy*   Lab Work:  None ordered  Testing/Procedures:  Your physician has requested that you have a carotid duplex. This test is an ultrasound of the carotid arteries in your neck. It looks at blood flow through these arteries that supply the brain with blood. Allow one hour for this exam. There are no restrictions or special instructions.   Follow-Up: At [facility], you and your health needs are our priority.  As part of our continuing mission to provide you with exceptional heart care, we have created designated Provider Care Teams.  These Care Teams include your primary Cardiologist (physician) and Advanced Practice Providers (APPs -  Physician Assistants and Nurse Practitioners) who all work together to provide you with the care you need, when you need it.  We recommend signing up for the patient portal called "MyChart".  Sign up information is provided on this After Visit Summary.  MyChart is used to connect with patients for Virtual Visits (Telemedicine).  Patients are able to view lab/test results, encounter notes, upcoming appointments, etc.  Non-urgent messages can be sent to your provider as well.   To learn more about what you can do with MyChart, go to [email or web address].    Your next appointment:    Early November  The format for your next appointment:   In Person  Provider:   You may see Nelva Bush, MD or one of the following Advanced Practice Providers on your designated Care Team:   Murray Hodgkins, NP Christell Faith, PA-C Cadence Kathlen Mody, PA-C{    Important Information About Sugar

## 2022-03-29 NOTE — Progress Notes (Signed)
Follow-up Outpatient Visit Date: 03/29/2022  Primary Care Provider: Ria Bush, MD [place] [address]  Chief Complaint: Follow-up coronary artery disease, atrial fibrillation, HFrEF, and endocarditis  HPI:  Mr. Meegan is a 81 y.o. male with history of three-vessel CAD status post CABG (0/5110) complicated by postoperative atrial fibrillation (transient atrial fibrillation recurred in the setting of sepsis related to a sending cholangitis), ascending cholangitis complicated by recurrent atrial fibrillation, acute HFrEF (LVEF 40-45%), and mitral valve endocarditis, as well as hypertension, hyperlipidemia, type 2 diabetes mellitus, and chronic kidney disease stage III, who presents for follow-up of coronary artery disease, cardiomyopathy, and endocarditis.  He was hospitalized with in February with abdominal pain.  He was found to have polymicrobial bacteremia secondary to a sending cholangitis complicated by endocarditis with a small vegetation on his mitral valve.  He also had acute decline in LVEF to 40-45% and paroxysmal atrial fibrillation.  He was discharged on metoprolol and apixaban as well as 6 weeks of IV antibiotics (completed 12/05/2021).  I last saw him in April, at which time Mr. Macchia was slowly rebuilding his strength.  He was also concerned about insomnia at that time.  He also noted that he felt worse ever since he underwent TEE in February.  We agreed to repeat an echocardiogram to see if his cardiomyopathy was improving (LVEF 50-55% in May).  He was also referred to ENT for further assessment of his hoarseness.  He reports that paralysis of the right vocal cord was identified.  He was offered referral to [facility] for further testing but declined.  Today, Mr. Wingard is bothered most by continued fatigue.  He finds it difficult to walk extended distances.  He was recently told by Dr. Danise Mina that he seemed to have more profound weakness in the right leg.   He also gets out of breath when he walks for extended periods.  Overall, he feels slightly better compared with our last visit but is not back to baseline.  He denies chest pain, palpitations, and lightheadedness.  He has also had minimal edema.  He inquires today about moving forward with cholecystectomy, which had previously been on hold pending improvement in his cardiomyopathy.  --------------------------------------------------------------------------------------------------  Past Medical History:  Diagnosis Date   Arthritis    Ascending cholangitis 10/2021   Cataract    lens implant bilateral   Chronic kidney disease    kidney stones   Coronary artery disease    Dyspnea    Dysrhythmia    Endocarditis of mitral valve 10/2021   In setting of bacteremia from ascending colangitis   GERD (gastroesophageal reflux disease)    HFrEF (heart failure with reduced ejection fraction) ([place]) 10/2021   LVEF 40-45%   History of cholelithiasis    History of kidney stones    ca ox [name] @ [facility]) now [employer]   History of pneumonia    HLD (hyperlipidemia)    HTN (hypertension)    Jaundice    age 63   Paroxysmal atrial fibrillation ([place])    Pneumonia    years ago    T2DM (type 2 diabetes mellitus) ([place]) 2010   Past Surgical History:  Procedure Laterality Date   CARPAL TUNNEL RELEASE Bilateral    CATARACT EXTRACTION, BILATERAL     COLONOSCOPY  11/2012   11 adenomatous polyps, diverticulosis, rec rpt 1 yr Ardis Hughs)   COLONOSCOPY  12/2013   3 polyps, diverticulosis, rec rpt 3 yrs Ardis Hughs)   COLONOSCOPY  06/2019  6 polyps (TA), diverticulosis, f/u left open ended Ardis Hughs)   CORONARY ARTERY BYPASS GRAFT N/A 01/18/2018   Procedure: CORONARY ARTERY BYPASS GRAFTING (CABG) x 3; Using Left Internal Mammary Artery, and Right Greater Saphenous Vein harvested Endoscopically, Coronary Artery Endarterectomy;  Surgeon: Ivin Poot, MD;  Location: [facility];  Service: Open Heart Surgery;   Laterality: N/A;   ERCP N/A 10/23/2021   Procedure: ENDOSCOPIC RETROGRADE CHOLANGIOPANCREATOGRAPHY (ERCP);  Surgeon: Ladene Artist, MD;  Location: [facility] ENDOSCOPY;  Service: Endoscopy;  Laterality: N/A;   KNEE CARTILAGE SURGERY Left    LEFT HEART CATH AND CORONARY ANGIOGRAPHY N/A 12/21/2017   Procedure: LEFT HEART CATH AND CORONARY ANGIOGRAPHY;  Surgeon: Nelva Bush, MD;  Location: [place] [facility] CV LAB;  Service: Cardiovascular;  Laterality: N/A;   LITHOTRIPSY     REMOVAL OF STONES  10/23/2021   Procedure: REMOVAL OF STONES;  Surgeon: Ladene Artist, MD;  Location: [facility] ENDOSCOPY;  Service: Endoscopy;;   SPHINCTEROTOMY  10/23/2021   Procedure: Joan Mayans;  Surgeon: Ladene Artist, MD;  Location: [place];  Service: Endoscopy;;   TEE WITHOUT CARDIOVERSION N/A 01/18/2018   Procedure: TRANSESOPHAGEAL ECHOCARDIOGRAM (TEE);  Surgeon: Prescott Gum, Collier Salina, MD;  Location: [place];  Service: Open Heart Surgery;  Laterality: N/A;   TEE WITHOUT CARDIOVERSION N/A 10/28/2021   Procedure: TRANSESOPHAGEAL ECHOCARDIOGRAM (TEE);  Surgeon: Pixie Casino, MD;  Location: [facility] ENDOSCOPY;  Service: Cardiovascular;  Laterality: N/A;   UMBILICAL HERNIA REPAIR     with mesh    Current Meds  Medication Sig   ACCU-CHEK AVIVA PLUS test strip USE AS DIRECTED TO CHECK BLOOD SUGARS UPTO FOUR TIMES DAILY.   apixaban (ELIQUIS) 5 MG TABS tablet Take 1 tablet (5 mg total) by mouth 2 (two) times daily.   blood glucose meter kit and supplies Dispense based on patient and insurance preference. Use up to four times daily as directed. (FOR ICD-10 E10.9, E11.9).   Cholecalciferol (VITAMIN D3) 25 MCG (1000 UT) CAPS Take 1 capsule (1,000 Units total) by mouth daily.   Cyanocobalamin (B-12) 1000 MCG SUBL Place 1 tablet under the tongue daily.   ENTRESTO 24-26 MG TAKE 1 TABLET BY MOUTH TWICE A DAY   FARXIGA 5 MG TABS tablet TAKE 1 TABLET BY MOUTH EVERY DAY   fenofibrate (TRICOR) 145 MG tablet Take 1 tablet (145 mg total) by mouth  daily.   metFORMIN (GLUCOPHAGE) 500 MG tablet Take by mouth daily with breakfast.   metoprolol succinate (TOPROL-XL) 50 MG 24 hr tablet Take 1 tablet (50 mg total) by mouth daily. Take with or immediately following a meal.   Multiple Vitamins-Minerals (MACULAR VITAMIN BENEFIT PO) Take 1 tablet by mouth daily.   nitroGLYCERIN (NITROSTAT) 0.4 MG SL tablet Place 1 tablet (0.4 mg total) under the tongue every 5 (five) minutes as needed for chest pain.   Omega-3 Fatty Acids (FISH OIL) 1200 MG CAPS Take 2 capsules (2,400 mg total) by mouth daily.   omeprazole (PRILOSEC) 40 MG capsule Take 1 capsule (40 mg total) by mouth every Monday, Wednesday, and Friday.   rosuvastatin (CRESTOR) 10 MG tablet TAKE 1 TABLET BY MOUTH EVERY DAY   tamsulosin (FLOMAX) 0.4 MG CAPS capsule TAKE 1 CAPSULE BY MOUTH EVERY DAY    Allergies: Patient has no known allergies.  Social History   Tobacco Use   Smoking status: Never   Smokeless tobacco: Former    Types: Chew    Quit date: 09/08/2001  Vaping Use   Vaping Use: Never used  Substance Use Topics   Alcohol use: Not Currently    Comment: occ   Drug use: No    Family History  Problem Relation Age of Onset   Alzheimer's disease Mother    Breast cancer Mother        breast   Atrial fibrillation Mother    CAD Mother    Stroke Father 69   Diabetes Father    CAD Father    Colon polyps Sister    Colon polyps Brother    Rectal cancer Maternal Grandfather        rectal   Colon cancer Maternal Grandfather 67   Esophageal cancer Neg Hx    Stomach cancer Neg Hx     Review of Systems: A 12-system review of systems was performed and was negative except as noted in the HPI.  --------------------------------------------------------------------------------------------------  Physical Exam: BP 138/62 (BP Location: Left Arm, Patient Position: Sitting, Cuff Size: Large)   Pulse 60   Ht 5' 10" (1.778 m)   Wt 241 lb (109.3 kg)   BMI 34.58 kg/m   General:   NAD. Neck: No JVD or HJR.  No carotid bruit. Lungs: Clear to auscultation bilaterally without wheezes or crackles. Heart: Regular rate and rhythm with occasional extrasystoles.  No murmurs, rubs, or gallops. Abdomen: Soft, nontender, nondistended. Extremities: No lower extremity edema.  EKG: Normal sinus rhythm with PACs and nonspecific ST segment changes.  PACs are new since 12/21/2021.  Otherwise, no significant interval change.  Lab Results  Component Value Date   WBC 6.7 12/07/2021   HGB 10.6 (L) 12/07/2021   HCT 32.3 (L) 12/07/2021   MCV 88.6 12/07/2021   PLT 287.0 12/07/2021    Lab Results  Component Value Date   NA 139 12/07/2021   K 5.0 12/07/2021   CL 103 12/07/2021   CO2 27 12/07/2021   BUN 25 (H) 12/07/2021   CREATININE 1.06 12/07/2021   GLUCOSE 128 (H) 12/07/2021   ALT 3 12/07/2021    Lab Results  Component Value Date   CHOL 115 12/07/2021   HDL 25.70 (L) 12/07/2021   LDLCALC 59 12/07/2021   LDLDIRECT 66.0 06/07/2021   TRIG 151.0 (H) 12/07/2021   CHOLHDL 4 12/07/2021    --------------------------------------------------------------------------------------------------  ASSESSMENT AND PLAN: Coronary artery disease: No angina reported.  LDL well controlled on last check in March.  Triglycerides borderline.  Continue current medications for secondary prevention, including apixaban and lieu of aspirin given paroxysmal atrial fibrillation.  Right-sided weakness: Mr. Minahan was recently told that he has asymmetric weakness of the right leg.  He was also noted to have right vocal cord paralysis.  This all began around the time of his hospitalization in February.  I am concerned about the possibility that he could have suffered a stroke in the setting of septic shock and endocarditis.  We discussed brain MRI, which Mr. Schrom declines due to claustrophobia.  We will obtain carotid Dopplers to ensure that he does not have significant carotid artery disease.  Further  management is deferred to Dr. Danise Mina.  Chronic HFrEF with recovered ejection fraction: Echo in May showed improved LVEF to 50-55%.  Mr. Santellan has persistent fatigue consistent with NYHA class II-III heart failure.  He appears euvolemic on examination.  Continue current doses of metoprolol and Entresto.  Defer ischemia evaluation given lack of angina and improved LVEF.  Paroxysmal atrial fibrillation: No palpitations reported.  EKG today again shows sinus rhythm with isolated PACs.  Continue apixaban 5 mg twice  daily and metoprolol succinate 50 mg daily.  Hyperlipidemia associated with type 2 diabetes mellitus: Lipids near goal on last check in March with triglycerides just slightly elevated at 151.  LDL good at 59.  Continue rosuvastatin and fenofibrate at current doses.  Defer further escalation of rosuvastatin for now given LDL at goal and concern for myalgias in the past with higher dose statin therapy.  Hypertension: Blood pressure mildly elevated today (goal less than 130/80) but typically better at other office visits.  Defer medication changes at this time, continuing metoprolol and Entresto.  Cholelithiasis and history of ascending cholangitis: Mr. Beverlin is eager to move forward with cholecystectomy.  He appears compensated from a heart standpoint.  I am also encouraged by his improved LVEF on echo in May.  I think it is reasonable for him to proceed with cholecystectomy, if felt appropriate by surgery and Dr. Danise Mina.  Additional cardiac testing/intervention at this point would not further mitigate the perioperative risk associated with this intermediate risk procedure.  Apixaban can be held for 2 days before the procedure and restarted when it is felt safe to do so from a postoperative standpoint.  I would recommend administration of aspirin 81 mg daily while Mr. Ogata is off apixaban given his history of CAD and CABG.  Follow-up: Return to clinic in 4 months.  Nelva Bush, MD 03/29/2022 10:46 AM

## 2022-04-05 ENCOUNTER — Ambulatory Visit (INDEPENDENT_AMBULATORY_CARE_PROVIDER_SITE_OTHER): Payer: Medicare PPO

## 2022-04-05 DIAGNOSIS — R531 Weakness: Secondary | ICD-10-CM

## 2022-04-10 ENCOUNTER — Encounter: Payer: Self-pay | Admitting: Family Medicine

## 2022-04-10 DIAGNOSIS — K8309 Other cholangitis: Secondary | ICD-10-CM

## 2022-04-10 DIAGNOSIS — I5022 Chronic systolic (congestive) heart failure: Secondary | ICD-10-CM

## 2022-04-10 NOTE — Telephone Encounter (Signed)
Spoke with pt's wife, Barnetta Chapel (on dpr) for clarification of message.  She states pt was seen by Dr. Saunders Revel and told he was ok for surgery.  She is asking Dr. Darnell Level to schedule appt with the surgeon.  She states all of this was discussed at pt's last OV.

## 2022-04-18 NOTE — Addendum Note (Signed)
Addended by: Ria Bush on: 04/18/2022 02:09 PM   Modules accepted: Orders

## 2022-04-24 ENCOUNTER — Other Ambulatory Visit: Payer: Self-pay | Admitting: Internal Medicine

## 2022-04-24 ENCOUNTER — Ambulatory Visit: Payer: Medicare PPO | Admitting: Family Medicine

## 2022-04-24 NOTE — Telephone Encounter (Signed)
Please advise for Farxiga see notification when attempting to refill there is a preferred formulary prompt.

## 2022-04-24 NOTE — Telephone Encounter (Signed)
Refill Request.  

## 2022-04-24 NOTE — Telephone Encounter (Signed)
Prescription refill request for Eliquis received. Indication:Afib Last office visit:7/23 Scr:1.0 Age: 81 Weight:109.3 kg  Prescription refilled

## 2022-04-25 ENCOUNTER — Telehealth: Payer: Self-pay | Admitting: Internal Medicine

## 2022-04-25 ENCOUNTER — Telehealth: Payer: Self-pay

## 2022-04-25 ENCOUNTER — Other Ambulatory Visit
Admission: RE | Admit: 2022-04-25 | Discharge: 2022-04-25 | Disposition: A | Discharge status: Home / Self Care | Payer: Medicare PPO | Source: Ambulatory Visit | Attending: Surgery | Admitting: Surgery

## 2022-04-25 ENCOUNTER — Ambulatory Visit: Payer: Medicare PPO | Admitting: Surgery

## 2022-04-25 ENCOUNTER — Other Ambulatory Visit: Payer: Self-pay

## 2022-04-25 ENCOUNTER — Encounter: Payer: Self-pay | Admitting: Surgery

## 2022-04-25 VITALS — BP 126/77 | HR 62 | Temp 97.9°F | Ht 71.0 in | Wt 242.8 lb

## 2022-04-25 DIAGNOSIS — K8010 Calculus of gallbladder with chronic cholecystitis without obstruction: Secondary | ICD-10-CM | POA: Diagnosis not present

## 2022-04-25 DIAGNOSIS — K8309 Other cholangitis: Secondary | ICD-10-CM | POA: Diagnosis not present

## 2022-04-25 DIAGNOSIS — Z8719 Personal history of other diseases of the digestive system: Secondary | ICD-10-CM | POA: Insufficient documentation

## 2022-04-25 HISTORY — DX: Calculus of gallbladder with chronic cholecystitis without obstruction: K80.10

## 2022-04-25 HISTORY — DX: Personal history of other diseases of the digestive system: Z87.19

## 2022-04-25 LAB — CBC WITH DIFFERENTIAL/PLATELET
Abs Immature Granulocytes: 0.03 10*3/uL (ref 0.00–0.07)
Basophils Absolute: 0 10*3/uL (ref 0.0–0.1)
Basophils Relative: 0 %
Eosinophils Absolute: 0.1 10*3/uL (ref 0.0–0.5)
Eosinophils Relative: 1 %
HCT: 38.8 % — ABNORMAL LOW (ref 39.0–52.0)
Hemoglobin: 11.8 g/dL — ABNORMAL LOW (ref 13.0–17.0)
Immature Granulocytes: 0 %
Lymphocytes Relative: 29 %
Lymphs Abs: 2.3 10*3/uL (ref 0.7–4.0)
MCH: 27.4 pg (ref 26.0–34.0)
MCHC: 30.4 g/dL (ref 30.0–36.0)
MCV: 90 fL (ref 80.0–100.0)
Monocytes Absolute: 0.5 10*3/uL (ref 0.1–1.0)
Monocytes Relative: 6 %
Neutro Abs: 5.1 10*3/uL (ref 1.7–7.7)
Neutrophils Relative %: 64 %
Platelets: 291 10*3/uL (ref 150–400)
RBC: 4.31 MIL/uL (ref 4.22–5.81)
RDW: 15.9 % — ABNORMAL HIGH (ref 11.5–15.5)
WBC: 8.1 10*3/uL (ref 4.0–10.5)
nRBC: 0 % (ref 0.0–0.2)

## 2022-04-25 LAB — COMPREHENSIVE METABOLIC PANEL
ALT: 6 U/L (ref 0–44)
AST: 13 U/L — ABNORMAL LOW (ref 15–41)
Albumin: 3.6 g/dL (ref 3.5–5.0)
Alkaline Phosphatase: 36 U/L — ABNORMAL LOW (ref 38–126)
Anion gap: 7 (ref 5–15)
BUN: 29 mg/dL — ABNORMAL HIGH (ref 8–23)
CO2: 23 mmol/L (ref 22–32)
Calcium: 9.1 mg/dL (ref 8.9–10.3)
Chloride: 110 mmol/L (ref 98–111)
Creatinine, Ser: 1.44 mg/dL — ABNORMAL HIGH (ref 0.61–1.24)
GFR, Estimated: 49 mL/min — ABNORMAL LOW (ref >60)
Glucose, Bld: 122 mg/dL — ABNORMAL HIGH (ref 70–99)
Potassium: 4.2 mmol/L (ref 3.5–5.1)
Sodium: 140 mmol/L (ref 135–145)
Total Bilirubin: 0.6 mg/dL (ref 0.3–1.2)
Total Protein: 7.3 g/dL (ref 6.5–8.1)

## 2022-04-25 NOTE — Patient Instructions (Addendum)
    Please go to [facility] to have blood work done.   Ultrasound scheduled 04/28/2022 @ 7:45 am @ Outpatient imaging. [place]  [phone]  Please see your follow up appointment listed below.

## 2022-04-25 NOTE — Telephone Encounter (Signed)
   Pre-operative Risk Assessment    Patient Name: [name] Young.  DOB: 07/14/1941 MRN: [id number]      Request for Surgical Clearance    Procedure:  Laparoscopic Cholecystectomy  Date of Surgery:  Clearance TBD                                 Surgeon:  Dr. Ronny Bacon Surgeon's Group or Practice Name:  [facility] Phone number:  [phone] Fax number:  [phone]   Type of Clearance Requested:   - Medical    Type of Anesthesia:  Not Indicated   Additional requests/questions:    Signed, Pilar A Ham   04/25/2022, 1:44 PM

## 2022-04-25 NOTE — Telephone Encounter (Signed)
   Patient Name: [name] Young.  DOB: [birth year] MRN: [id number]  Primary Cardiologist: Nelva Bush, MD  Chart reviewed as part of pre-operative protocol coverage.   Patient was cleared for surgery by Dr. Saunders Revel at recent office visit on 03/29/2022.  Per Dr. Saunders Revel, patient may hold Eliquis for 2 days prior to procedure.  Please resume Eliquis as soon as possible postprocedure, the discretion of the surgeon.  I will route this recommendation well as Dr. Darnelle Bos note from recent office visit on 03/29/2022 to the requesting party via [facility] fax function and remove from pre-op pool.  Please call with questions.  Lenna Sciara, NP 04/25/2022, 4:26 PM

## 2022-04-25 NOTE — Progress Notes (Signed)
Patient ID: [name] Young., male   DOB: 06-18-1941, 81 y.o.   MRN: [id number]  Chief Complaint: History of ascending cholangitis  History of Present Illness [name] Young. is a 81 y.o. male with ascending cholangitis, for which she was hospitalized last February.  Underwent ERCP February 12.  Common bile duct was dilated to 11 mm at that time, sphincterotomy of 10 mm was completed, and the stone cleared, draining bile and sludge.  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  He also reports having vegetations of his mitral valve with endocarditis for which he received antibiotics for a 6-week period.  He subsequently went through rehab.  He reports his cardiologist has cleared him to proceed with elective cholecystectomy. His EF improved with left ventricular ejection fraction of 50 to 55% last noted in May. Currently taking Eliquis, and should be bridged with aspirin through his surgery. Of note he reports that he remains weak, having relearn to walk over the last once.  He reports belching, but is currently not on any diet restrictions.  He does not check his blood sugars but he reports his hemoglobin A1c is good.  He denies any other issues regarding right upper quadrant pain or postprandial pain. No recent jaundice, hematochezia or melena, or acholic stools.  He denies any back pain.   Past Medical History Past Medical History:  Diagnosis Date   Arthritis    Ascending cholangitis 10/2021   Cataract    lens implant bilateral   Chronic kidney disease    kidney stones   Coronary artery disease    Dyspnea    Dysrhythmia    Endocarditis of mitral valve 10/2021   In setting of bacteremia from ascending colangitis   GERD (gastroesophageal reflux disease)    HFrEF (heart failure with reduced ejection fraction) ([place]) 10/2021   LVEF 40-45%   History of cholelithiasis    History of kidney stones    ca ox [name]) now  [facility]   History of pneumonia    HLD (hyperlipidemia)    HTN (hypertension)    Jaundice    age 24   Paroxysmal atrial fibrillation ([place])    Pneumonia    years ago    T2DM (type 2 diabetes mellitus) ([place]) 2010      Past Surgical History:  Procedure Laterality Date   CARPAL TUNNEL RELEASE Bilateral    CATARACT EXTRACTION, BILATERAL     COLONOSCOPY  11/2012   11 adenomatous polyps, diverticulosis, rec rpt 1 yr Ardis Hughs)   COLONOSCOPY  12/2013   3 polyps, diverticulosis, rec rpt 3 yrs Ardis Hughs)   COLONOSCOPY  06/2019   6 polyps (TA), diverticulosis, f/u left open ended Ardis Hughs)   CORONARY ARTERY BYPASS GRAFT N/A 01/18/2018   Procedure: CORONARY ARTERY BYPASS GRAFTING (CABG) x 3; Using Left Internal Mammary Artery, and Right Greater Saphenous Vein harvested Endoscopically, Coronary Artery Endarterectomy;  Surgeon: Ivin Poot, MD;  Location: [place];  Service: Open Heart Surgery;  Laterality: N/A;   ERCP N/A 10/23/2021   Procedure: ENDOSCOPIC RETROGRADE CHOLANGIOPANCREATOGRAPHY (ERCP);  Surgeon: Ladene Artist, MD;  Location[facility] ENDOSCOPY;  Service: Endoscopy;  Laterality: N/A;   KNEE CARTILAGE SURGERY Left    LEFT HEART CATH AND CORONARY ANGIOGRAPHY N/A 12/21/2017   Procedure: LEFT HEART CATH AND CORONARY ANGIOGRAPHY;  Surgeon: Nelva Bush, MD;  Location: [place] CV LAB;  Service: Cardiovascular;  Laterality: N/A;   LITHOTRIPSY  REMOVAL OF STONES  10/23/2021   Procedure: REMOVAL OF STONES;  Surgeon: Ladene Artist, MD;  Location: [facility] ENDOSCOPY;  Service: Endoscopy;;   SPHINCTEROTOMY  10/23/2021   Procedure: Joan Mayans;  Surgeon: Ladene Artist, MD;  Location: [place];  Service: Endoscopy;;   TEE WITHOUT CARDIOVERSION N/A 01/18/2018   Procedure: TRANSESOPHAGEAL ECHOCARDIOGRAM (TEE);  Surgeon: Prescott Gum, Collier Salina, MD;  Location: [place];  Service: Open Heart Surgery;  Laterality: N/A;   TEE WITHOUT CARDIOVERSION N/A 10/28/2021   Procedure: TRANSESOPHAGEAL ECHOCARDIOGRAM  (TEE);  Surgeon: Pixie Casino, MD;  Location: [facility] ENDOSCOPY;  Service: Cardiovascular;  Laterality: N/A;   UMBILICAL HERNIA REPAIR     with mesh    No Known Allergies  Current Outpatient Medications  Medication Sig Dispense Refill   ACCU-CHEK AVIVA PLUS test strip USE AS DIRECTED TO CHECK BLOOD SUGARS UPTO FOUR TIMES DAILY. 100 each 3   blood glucose meter kit and supplies Dispense based on patient and insurance preference. Use up to four times daily as directed. (FOR ICD-10 E10.9, E11.9). 1 each 0   Cholecalciferol (VITAMIN D3) 25 MCG (1000 UT) CAPS Take 1 capsule (1,000 Units total) by mouth daily. 30 capsule    Cyanocobalamin (B-12) 1000 MCG SUBL Place 1 tablet under the tongue daily.     ELIQUIS 5 MG TABS tablet TAKE 1 TABLET BY MOUTH TWICE A DAY 60 tablet 3   ENTRESTO 24-26 MG TAKE 1 TABLET BY MOUTH TWICE A DAY 60 tablet 4   FARXIGA 5 MG TABS tablet TAKE 1 TABLET BY MOUTH EVERY DAY 30 tablet 3   metFORMIN (GLUCOPHAGE) 500 MG tablet Take by mouth daily with breakfast.     metoprolol succinate (TOPROL-XL) 50 MG 24 hr tablet Take 1 tablet (50 mg total) by mouth daily. Take with or immediately following a meal. 30 tablet 5   Multiple Vitamins-Minerals (MACULAR VITAMIN BENEFIT PO) Take 1 tablet by mouth daily.     nitroGLYCERIN (NITROSTAT) 0.4 MG SL tablet Place 1 tablet (0.4 mg total) under the tongue every 5 (five) minutes as needed for chest pain. 25 tablet 4   Omega-3 Fatty Acids (FISH OIL) 1200 MG CAPS Take 2 capsules (2,400 mg total) by mouth daily.     omeprazole (PRILOSEC) 40 MG capsule Take 1 capsule (40 mg total) by mouth every Monday, Wednesday, and Friday. 40 capsule 3   rosuvastatin (CRESTOR) 10 MG tablet TAKE 1 TABLET BY MOUTH EVERY DAY 90 tablet 0   tamsulosin (FLOMAX) 0.4 MG CAPS capsule TAKE 1 CAPSULE BY MOUTH EVERY DAY 30 capsule 6   fenofibrate (TRICOR) 145 MG tablet Take 1 tablet (145 mg total) by mouth daily. 30 tablet 3   No current facility-administered medications  for this visit.    Family History Family History  Problem Relation Age of Onset   Alzheimer's disease Mother    Breast cancer Mother        breast   Atrial fibrillation Mother    CAD Mother    Stroke Father 75   Diabetes Father    CAD Father    Colon polyps Sister    Colon polyps Brother    Rectal cancer Maternal Grandfather        rectal   Colon cancer Maternal Grandfather 6   Esophageal cancer Neg Hx    Stomach cancer Neg Hx       Social History Social History   Tobacco Use   Smoking status: Never   Smokeless tobacco: Former  Types: Sarina Ser    Quit date: 09/08/2001  Vaping Use   Vaping Use: Never used  Substance Use Topics   Alcohol use: Not Currently    Comment: occ   Drug use: No        Review of Systems  Constitutional: Negative.   HENT:  Positive for hearing loss.   Eyes: Negative.   Respiratory: Negative.    Cardiovascular: Negative.   Gastrointestinal: Negative.   Genitourinary:  Positive for urgency.  Skin: Negative.   Neurological: Negative.   Psychiatric/Behavioral: Negative.        Physical Exam Blood pressure 126/77, pulse 62, temperature 97.9 F (36.6 C), temperature source Oral, height _0  (1.803 m), weight 242 lb 12.8 oz (110.1 kg), SpO2 98 %. Last Weight  Most recent update: 04/25/2022 10:11 AM    Weight  110.1 kg (242 lb 12.8 oz)             CONSTITUTIONAL: Well developed, and nourished, appropriately responsive and aware without distress.   EYES: Sclera non-icteric.   EARS, NOSE, MOUTH AND THROAT:  The oropharynx is clear. Oral mucosa is pink and moist.   Hearing is intact to voice, his voice is at he reports a new baseline, and not his native voice. NECK: Trachea is midline, and there is no jugular venous distension.  LYMPH NODES:  Lymph nodes in the neck are not enlarged. RESPIRATORY:  Lungs are clear, and breath sounds are equal bilaterally. Normal respiratory effort without pathologic use of accessory  muscles. CARDIOVASCULAR: Heart is regular in rate and rhythm. GI: The abdomen is  soft, nontender, and nondistended.  He has a wide epigastric diastases.  There were no palpable masses. I did not appreciate hepatosplenomegaly. There were normal bowel sounds.   SKIN: Skin turgor is normal. No pathologic skin lesions appreciated.  NEUROLOGIC:  Motor and sensation appear grossly normal.  Cranial nerves are grossly without defect. PSYCH:  Alert and oriented to person, place and time. Affect is appropriate for situation.  Data Reviewed I have personally reviewed what is currently available of the patient's imaging, recent labs and medical records.   Labs:     Latest Ref Rng & Units 04/25/2022   11:13 AM 12/07/2021    8:33 AM 11/04/2021    4:18 AM  CBC  WBC 4.0 - 10.5 K/uL 8.1  6.7  8.6   Hemoglobin 13.0 - 17.0 g/dL 11.8  10.6  8.4   Hematocrit 39.0 - 52.0 % 38.8  32.3  26.5   Platelets 150 - 400 K/uL 291  287.0  370       Latest Ref Rng & Units 04/25/2022   11:13 AM 12/07/2021    8:33 AM 11/04/2021    4:18 AM  CMP  Glucose 70 - 99 mg/dL 122  128  128   BUN 8 - 23 mg/dL _1 Creatinine 0.61 - 1.24 mg/dL 1.44  1.06  1.14   Sodium 135 - 145 mmol/L 140  139  135   Potassium 3.5 - 5.1 mmol/L 4.2  5.0  3.9   Chloride 98 - 111 mmol/L 110  103  105   CO2 22 - 32 mmol/L _2 Calcium 8.9 - 10.3 mg/dL 9.1  9.4  8.2   Total Protein 6.5 - 8.1 g/dL 7.3  6.8    Total Bilirubin 0.3 - 1.2 mg/dL 0.6  0.7    Alkaline Phos 38 - 126 U/L 36  56    AST 15 - 41 U/L 13  13    ALT 0 - 44 U/L 6  3        Imaging: CLINICAL DATA:  81 year old male with history of chest and back pain. Suspected aortic dissection.   EXAM: CT ANGIOGRAPHY CHEST, ABDOMEN AND PELVIS   TECHNIQUE: Non-contrast CT of the chest was initially obtained.   Multidetector CT imaging through the chest, abdomen and pelvis was performed using the standard protocol during bolus administration of intravenous contrast.  Multiplanar reconstructed images and MIPs were obtained and reviewed to evaluate the vascular anatomy.   RADIATION DOSE REDUCTION: This exam was performed according to the departmental dose-optimization program which includes automated exposure control, adjustment of the mA and/or kV according to patient size and/or use of iterative reconstruction technique.   CONTRAST:  19m OMNIPAQUE IOHEXOL 350 MG/ML SOLN   COMPARISON:  Chest CT 01/17/2018.   FINDINGS: CTA CHEST FINDINGS   Cardiovascular: Precontrast images demonstrate no crescentic high attenuation associated with the wall of the thoracic aorta to suggest the presence of acute intramural hemorrhage. Postcontrast images demonstrate no evidence of thoracic aortic aneurysm or dissection. Heart size is normal. There is no significant pericardial fluid, thickening or pericardial calcification. There is aortic atherosclerosis, as well as atherosclerosis of the great vessels of the mediastinum and the coronary arteries, including calcified atherosclerotic plaque in the left main, left anterior descending, left circumflex and right coronary arteries. Status post median sternotomy for CABG including LIMA to the LAD. Calcifications of the aortic valve.   Mediastinum/Nodes: No pathologically enlarged mediastinal or hilar lymph nodes. Esophagus is unremarkable in appearance. No axillary lymphadenopathy.   Lungs/Pleura: No acute consolidative airspace disease. No pleural effusions. No definite suspicious appearing pulmonary nodules or masses are noted on today's examination which is limited by considerable patient motion. Dependent areas of subsegmental atelectasis or scarring are noted in the lung bases bilaterally.   Musculoskeletal: Median sternotomy wires. There are no aggressive appearing lytic or blastic lesions noted in the visualized portions of the skeleton.   Review of the MIP images confirms the above findings.   CTA  ABDOMEN AND PELVIS FINDINGS   VASCULAR   Aorta: Normal caliber aorta without aneurysm, dissection, vasculitis or significant stenosis.   Celiac: Patent without evidence of aneurysm, dissection, vasculitis or significant stenosis.   SMA: Patent without evidence of aneurysm, dissection, vasculitis or significant stenosis.   Renals: Both renal arteries are patent without evidence of aneurysm, dissection, vasculitis, fibromuscular dysplasia or significant stenosis.   IMA: Patent without evidence of aneurysm, dissection, vasculitis or significant stenosis.   Inflow: Patent without evidence of aneurysm, dissection, vasculitis or significant stenosis.   Veins: No obvious venous abnormality within the limitations of this arterial phase study.   Review of the MIP images confirms the above findings.   NON-VASCULAR   Hepatobiliary: No suspicious cystic or solid hepatic lesions are confidently identified on today's arterial phase examination. Partially calcified gallstone measuring 1.1 cm in diameter lying dependently in the gallbladder. No findings to suggest an acute cholecystitis are noted at this time. Common bile duct measures 11 mm in the porta hepatis. No calcified stones are identified in the common bile duct. No intrahepatic biliary ductal dilatation.   Pancreas: No pancreatic mass. No pancreatic ductal dilatation. No pancreatic or peripancreatic fluid collections or inflammatory changes.   Spleen: Unremarkable.   Adrenals/Urinary Tract: 5 mm calculus at the right ureteropelvic junction (axial image 191 of series 7). No proximal hydroureteronephrosis  to indicate active obstruction at this time. There is extensive perinephric stranding bilaterally (nonspecific). Low-attenuation lesions in both kidneys, compatible with simple cysts, measuring up to 4.4 cm in the lower pole of the right kidney. Urinary bladder is normal in appearance. Bilateral adrenal glands are normal  in appearance.   Stomach/Bowel: Normal appearance of the stomach. No pathologic dilatation of small bowel or colon. Numerous colonic diverticulae are noted, particularly in the sigmoid colon, without definite focal surrounding inflammatory changes to clearly indicate an associated diverticulitis at this time. The appendix is not confidently identified and may be surgically absent. Regardless, there are no inflammatory changes noted adjacent to the cecum to suggest the presence of an acute appendicitis at this time.   Lymphatic: No lymphadenopathy noted in the abdomen or pelvis.   Reproductive: Prostate gland and seminal vesicles are unremarkable in appearance.   Other: No significant volume of ascites.  No pneumoperitoneum.   Musculoskeletal: There are no aggressive appearing lytic or blastic lesions noted in the visualized portions of the skeleton.   Review of the MIP images confirms the above findings.   IMPRESSION: 1. No evidence of acute aortic syndrome. 2. No acute findings in the thorax to account for the patient's symptoms. 3. 5 mm calculus at the right ureteropelvic junction. At this time, there is no proximal hydronephrosis to indicate urinary tract obstruction. 4. Cholelithiasis without evidence of acute cholecystitis. 5. Dilatation of the common bile duct. No intrahepatic biliary ductal dilatation to clearly indicate biliary tract obstruction. Additionally, there is no calcified choledocholithiasis. This is of uncertain etiology and significance, and could be age related, however, correlation with liver function tests is recommended. If there is clinical concern for biliary tract obstruction, further evaluation with abdominal MRI with and without IV gadolinium with MRCP would be recommended. 6. Aortic atherosclerosis, in addition to left main and three-vessel coronary artery disease. Status post median sternotomy for CABG including LIMA to the LAD. 7. There are  calcifications of the aortic valve. Echocardiographic correlation for evaluation of potential valvular dysfunction may be warranted if clinically indicated. 8. Additional incidental findings, as above.     Electronically Signed   By: Vinnie Langton M.D.   On: 10/23/2021 07:20  Within last 24 hrs: No results found.  Assessment    Gallstones, relatively asymptomatic however with history of ascending cholangitis with choledocholithiasis.  Status post ERCP with sphincterotomy.  Currently with normal LFTs, and CBC. Patient Active Problem List   Diagnosis Date Noted   Hoarseness 12/22/2021   Infective endocarditis of mitral valve 11/12/2021   Palliative care by specialist    Bacteremia due to Escherichia coli 10/24/2021   Bacteremia due to Streptococcus 10/24/2021   Ascending cholangitis    Vitamin B12 deficiency 05/24/2020   Vitamin D deficiency 05/24/2020   Early dry stage nonexudative age-related macular degeneration of both eyes 12/01/2019   Pain due to onychomycosis of toenails of both feet 07/28/2019   Diabetic neuropathy ([place]) 07/28/2019   Hearing loss, right 05/22/2019   Chronic HFrEF (heart failure with reduced ejection fraction) ([place]) 05/24/2018   Atrial fibrillation ([place]) 02/08/2018   Debility 01/28/2018   Anemia    S/P CABG x 3 01/18/2018   Coronary artery disease 12/29/2017   Chronic inactive rheumatic heart disease 11/24/2017   DNR (do not resuscitate) 06/14/2017   Peripheral neuropathy 06/14/2017   Tinea cruris 04/02/2017   Health maintenance examination 12/12/2016   Obesity, Class I, BMI 30-34.9 12/12/2016   Advanced care planning/counseling discussion 10/30/2014  Benign prostatic hyperplasia 10/30/2014   CKD stage 3 due to type 2 diabetes mellitus ([place]) 07/08/2013   Tinea corporis 01/16/2013   Medicare annual wellness visit, subsequent 10/11/2011   Type 2 diabetes mellitus with other specified complication ([place])    Essential hypertension    Hyperlipidemia  associated with type 2 diabetes mellitus ([place])    History of kidney stones    GERD (gastroesophageal reflux disease)     Plan    I have ordered a right upper quadrant ultrasound to currently evaluate what remains. He strongly desires to proceed and have definitive clearance of his gallstones, concerned that he may have another attack. We discussed robotic cholecystectomy with ICG imaging.  This was discussed thoroughly.  Optimal plan is for robotic cholecystectomy utilizing ICG imaging.  We would hold his Eliquis for 2 days preop and continue baby aspirin through the procedure.  Risks and benefits have been discussed with the patient which include but are not limited to anesthesia, bleeding, infection, biliary ductal injury, resulting in leak or stenosis, other associated unanticipated injuries affiliated with laparoscopic surgery.   Reviewed that removing the gallbladder will only address the symptoms related to the gallbladder itself.  I believe there is the desire to proceed, accepting the risks with understanding.  Questions elicited and answered to satisfaction.    No guarantees ever expressed or implied.   Face-to-face time spent with the patient and accompanying care providers(if present) was 45 minutes, with more than 50% of the time spent counseling, educating, and coordinating care of the patient.    These notes generated with voice recognition software. I apologize for typographical errors.  Ronny Bacon M.D., FACS 04/25/2022, 4:27 PM

## 2022-04-25 NOTE — Telephone Encounter (Signed)
Cardiac Clearance faxed to Dr.End  Medical Clearance faxed to Dr.[name] Gutierez.

## 2022-04-27 NOTE — Progress Notes (Signed)
Cardiology clearance has been received from Dr End. The patient is cleared for surgery. He may hold his Eliquis for 2 days prior to the procedure and resume as soon as possible post procedure. All notes are in Epic.

## 2022-04-28 ENCOUNTER — Ambulatory Visit
Admission: RE | Admit: 2022-04-28 | Discharge: 2022-04-28 | Disposition: A | Discharge status: Home / Self Care | Payer: Medicare PPO | Source: Ambulatory Visit | Attending: Surgery | Admitting: Surgery

## 2022-04-28 DIAGNOSIS — K8309 Other cholangitis: Secondary | ICD-10-CM | POA: Insufficient documentation

## 2022-04-28 DIAGNOSIS — K8020 Calculus of gallbladder without cholecystitis without obstruction: Secondary | ICD-10-CM | POA: Diagnosis not present

## 2022-05-01 ENCOUNTER — Telehealth: Payer: Self-pay

## 2022-05-01 NOTE — Telephone Encounter (Signed)
Received faxed surgical clearance form form [facility] for laparoscopic cholecystostomy. Surgery date TBD.  Spoke with pt's wife, Barnetta Chapel (on dpr), scheduling pre-op eval tomorrow at 3:00.

## 2022-05-02 ENCOUNTER — Encounter: Payer: Self-pay | Admitting: Surgery

## 2022-05-02 ENCOUNTER — Ambulatory Visit: Payer: Medicare PPO | Admitting: Surgery

## 2022-05-02 ENCOUNTER — Telehealth: Payer: Self-pay

## 2022-05-02 ENCOUNTER — Ambulatory Visit: Payer: Medicare PPO | Admitting: Family Medicine

## 2022-05-02 ENCOUNTER — Ambulatory Visit (INDEPENDENT_AMBULATORY_CARE_PROVIDER_SITE_OTHER)
Admission: RE | Admit: 2022-05-02 | Discharge: 2022-05-02 | Disposition: A | Discharge status: Home / Self Care | Payer: Medicare PPO | Source: Ambulatory Visit | Attending: Family Medicine | Admitting: Family Medicine

## 2022-05-02 ENCOUNTER — Encounter: Payer: Self-pay | Admitting: Family Medicine

## 2022-05-02 VITALS — BP 126/62 | HR 55 | Temp 98.1°F | Ht 71.0 in | Wt 247.1 lb

## 2022-05-02 VITALS — BP 134/75 | HR 60 | Temp 98.4°F | Ht 71.0 in | Wt 245.0 lb

## 2022-05-02 DIAGNOSIS — E538 Deficiency of other specified B group vitamins: Secondary | ICD-10-CM | POA: Diagnosis not present

## 2022-05-02 DIAGNOSIS — I5022 Chronic systolic (congestive) heart failure: Secondary | ICD-10-CM | POA: Diagnosis not present

## 2022-05-02 DIAGNOSIS — K8010 Calculus of gallbladder with chronic cholecystitis without obstruction: Secondary | ICD-10-CM

## 2022-05-02 DIAGNOSIS — Z01818 Encounter for other preprocedural examination: Secondary | ICD-10-CM

## 2022-05-02 DIAGNOSIS — R160 Hepatomegaly, not elsewhere classified: Secondary | ICD-10-CM | POA: Diagnosis not present

## 2022-05-02 DIAGNOSIS — E1169 Type 2 diabetes mellitus with other specified complication: Secondary | ICD-10-CM | POA: Diagnosis not present

## 2022-05-02 DIAGNOSIS — N183 Chronic kidney disease, stage 3 unspecified: Secondary | ICD-10-CM

## 2022-05-02 DIAGNOSIS — I480 Paroxysmal atrial fibrillation: Secondary | ICD-10-CM

## 2022-05-02 DIAGNOSIS — E1122 Type 2 diabetes mellitus with diabetic chronic kidney disease: Secondary | ICD-10-CM | POA: Diagnosis not present

## 2022-05-02 MED ORDER — LORAZEPAM 1 MG PO TABS
ORAL_TABLET | ORAL | 0 refills | Status: DC
Start: 2022-05-02 — End: 2022-05-18

## 2022-05-02 NOTE — Assessment & Plan Note (Addendum)
Chronic, stable on metformin and farxiga daily. Last A1c 6.1%.

## 2022-05-02 NOTE — Telephone Encounter (Addendum)
Seen today, awaiting CXR results then will forward to [facility].

## 2022-05-02 NOTE — Progress Notes (Unsigned)
Patient ID: [name] Young., male    DOB: 03-25-1941, 81 y.o.   MRN: [id number]  This visit was conducted in person.  BP 126/62   Pulse (!) 55   Temp 98.1 F (36.7 C) (Temporal)   Ht 5' 11"  (1.803 m)   Wt 247 lb 2 oz (112.1 kg)   SpO2 97%   BMI 34.47 kg/m    CC: preop evaluation for laparoscopic cholecystectomy  Subjective:   HPI: [name] Young. is a 81 y.o. male presenting on 05/02/2022 for Pre-op Exam (Pt needs laparoscopic cholecystostomy with Dr. Ronny Bacon of [facility]  Surgery date TBD.  Cards has already given instructions for Eliquis.  Pt accompanied by wife, [name])   [name]  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 Lab Results  Component Value Date   HGBA1C 6.1 (A) 03/24/2022     Planned upcoming laparoscopic cholecystectomy by Dr [facility]. Recent hospitalization for ascending cholangitis s/p ERCP 10/2021 with sphincterotomy. Just saw surgery earlier today - where abd [place] from last week showed new 5x5x5.4cm solid and cystic mass off R hepatic dome - planned CT scan as he would be unable to tolerate MRI (claustrophobia).   Has already received cardiac clearance with eliquis instructions as well (to bridge with aspirin perioperatively) - recent endocarditis s/p 6 weeks of IV antibiotics.   Patient has been feeling well.  Denies sleep apnea history. Melatonin hasn't helped sleep.  Patient has tolerated anesthesia well in the  past.  Latest surgical intervention was ERCP with sphincterotomy 10/2021.  Denies trouble with post-op nausea/vomiting, or trouble awakening after surgery.   Denies chest pain, dyspnea, palpitations, leg swelling, HA, dizziness.  No fevers/chills, coughing, abd pain, diarrhea.  No PNdyspnea, witnessed apnea, no loud snoring.      Relevant past medical, surgical, family and social history reviewed and updated as indicated. Interim medical history since our last visit reviewed. Allergies and medications reviewed and updated. Outpatient Medications Prior to Visit  Medication Sig Dispense Refill   ACCU-CHEK AVIVA PLUS test strip USE AS DIRECTED TO CHECK BLOOD SUGARS UPTO FOUR TIMES DAILY. 100 each 3   blood glucose meter kit and supplies Dispense based on patient and insurance preference. Use up to four times daily as directed. (FOR ICD-10 E10.9, E11.9). 1 each 0   Cholecalciferol (VITAMIN D3) 25 MCG (1000 UT) CAPS Take 1 capsule (1,000 Units total) by mouth daily. 30 capsule    Cyanocobalamin (B-12) 1000 MCG SUBL Place 1 tablet under the tongue daily.     ELIQUIS 5 MG TABS tablet TAKE 1 TABLET BY MOUTH TWICE A DAY 60 tablet 3   ENTRESTO 24-26 MG TAKE 1 TABLET BY MOUTH TWICE A DAY 60 tablet 4   FARXIGA 5 MG TABS tablet TAKE 1 TABLET BY MOUTH EVERY DAY 30 tablet 3   LORazepam (ATIVAN) 1 MG tablet Take one hour prior to CT scan. 1 tablet 0   metFORMIN (GLUCOPHAGE) 500 MG tablet Take by mouth daily with breakfast.     metoprolol succinate (TOPROL-XL) 50 MG 24 hr tablet Take 1 tablet (50 mg total) by mouth  daily. Take with or immediately following a meal. 30 tablet 5   Multiple Vitamins-Minerals (MACULAR VITAMIN BENEFIT PO) Take 1 tablet by mouth daily.     nitroGLYCERIN (NITROSTAT) 0.4 MG SL tablet Place 1 tablet (0.4 mg total) under the tongue every 5 (five) minutes as needed for chest pain. 25 tablet 4   Omega-3 Fatty Acids (FISH OIL) 1200 MG CAPS Take 2 capsules (2,400 mg total) by mouth daily.      omeprazole (PRILOSEC) 40 MG capsule Take 1 capsule (40 mg total) by mouth every Monday, Wednesday, and Friday. 40 capsule 3   rosuvastatin (CRESTOR) 10 MG tablet TAKE 1 TABLET BY MOUTH EVERY DAY 90 tablet 0   tamsulosin (FLOMAX) 0.4 MG CAPS capsule TAKE 1 CAPSULE BY MOUTH EVERY DAY 30 capsule 6   fenofibrate (TRICOR) 145 MG tablet Take 1 tablet (145 mg total) by mouth daily. 30 tablet 3   No facility-administered medications prior to visit.     Per HPI unless specifically indicated in ROS section below Review of Systems  Objective:  BP 126/62   Pulse (!) 55   Temp 98.1 F (36.7 C) (Temporal)   Ht 5' 11"  (1.803 m)   Wt 247 lb 2 oz (112.1 kg)   SpO2 97%   BMI 34.47 kg/m   Wt Readings from Last 3 Encounters:  05/02/22 247 lb 2 oz (112.1 kg)  05/02/22 245 lb (111.1 kg)  04/25/22 242 lb 12.8 oz (110.1 kg)      Physical Exam Vitals and nursing note reviewed.  Constitutional:      Appearance: Normal appearance. He is obese. He is not ill-appearing.     Comments: Ambulates with cane  HENT:     Mouth/Throat:     Mouth: Mucous membranes are moist.     Pharynx: Oropharynx is clear. No oropharyngeal exudate or posterior oropharyngeal erythema.  Eyes:     General:        Right eye: No discharge.        Left eye: No discharge.     Extraocular Movements: Extraocular movements intact.     Conjunctiva/sclera: Conjunctivae normal.     Pupils: Pupils are equal, round, and reactive to light.  Cardiovascular:     Rate and Rhythm: Normal rate and regular rhythm.     Pulses: Normal pulses.     Heart sounds: Normal heart sounds. No murmur heard. Pulmonary:     Effort: Pulmonary effort is normal. No respiratory distress.     Breath sounds: Normal breath sounds. No wheezing, rhonchi or rales.  Musculoskeletal:     Right lower leg: No edema.     Left lower leg: No edema.  Skin:    General: Skin is warm and dry.     Findings: No rash.  Neurological:     Mental Status: He is alert.   Psychiatric:        Mood and Affect: Mood normal.        Behavior: Behavior normal.       Results for orders placed or performed during the hospital encounter of 04/25/22  CBC with Differential/Platelet  Result Value Ref Range   WBC 8.1 4.0 - 10.5 K/uL   RBC 4.31 4.22 - 5.81 MIL/uL   Hemoglobin 11.8 (L) 13.0 - 17.0 g/dL   HCT 38.8 (L) 39.0 - 52.0 %   MCV 90.0 80.0 - 100.0 fL   MCH 27.4 26.0 - 34.0 pg   MCHC 30.4 30.0 - 36.0 g/dL   RDW  15.9 (H) 11.5 - 15.5 %   Platelets 291 150 - 400 K/uL   nRBC 0.0 0.0 - 0.2 %   Neutrophils Relative % 64 %   Neutro Abs 5.1 1.7 - 7.7 K/uL   Lymphocytes Relative 29 %   Lymphs Abs 2.3 0.7 - 4.0 K/uL   Monocytes Relative 6 %   Monocytes Absolute 0.5 0.1 - 1.0 K/uL   Eosinophils Relative 1 %   Eosinophils Absolute 0.1 0.0 - 0.5 K/uL   Basophils Relative 0 %   Basophils Absolute 0.0 0.0 - 0.1 K/uL   Immature Granulocytes 0 %   Abs Immature Granulocytes 0.03 0.00 - 0.07 K/uL  Comprehensive metabolic panel  Result Value Ref Range   Sodium 140 135 - 145 mmol/L   Potassium 4.2 3.5 - 5.1 mmol/L   Chloride 110 98 - 111 mmol/L   CO2 23 22 - 32 mmol/L   Glucose, Bld 122 (H) 70 - 99 mg/dL   BUN 29 (H) 8 - 23 mg/dL   Creatinine, Ser 1.44 (H) 0.61 - 1.24 mg/dL   Calcium 9.1 8.9 - 10.3 mg/dL   Total Protein 7.3 6.5 - 8.1 g/dL   Albumin 3.6 3.5 - 5.0 g/dL   AST 13 (L) 15 - 41 U/L   ALT 6 0 - 44 U/L   Alkaline Phosphatase 36 (L) 38 - 126 U/L   Total Bilirubin 0.6 0.3 - 1.2 mg/dL   GFR, Estimated 49 (L) >60 mL/min   Anion gap 7 5 - 15   Lab Results  Component Value Date   HGBA1C 6.1 (A) 03/24/2022    Lab Results  Component Value Date   VITAMINB12 277 06/07/2021   Lab Results  Component Value Date   VD25OH 41.60 06/07/2021  BNP    Component Value Date/Time   BNP 394.5 (H) 10/30/2021 0127   US Abdomen Limited RUQ (LIVER/GB) CLINICAL DATA:  Cholangitis  EXAM: ULTRASOUND ABDOMEN LIMITED RIGHT UPPER QUADRANT  COMPARISON:  CT scan  October 23, 2021  FINDINGS: Gallbladder:  Cholelithiasis identified with mild wall thickening measuring 3.6 mm. No Murphy's sign or pericholecystic fluid.  Common bile duct:  Diameter: 3.7 mm  Liver:  Apparent indeterminate mass in the right hepatic dome measuring 5.1 x 4.9 x 5.4 cm. This mass may contain solid and cystic components. Portal vein is patent on color Doppler imaging with normal direction of blood flow towards the liver.  Other: None.  IMPRESSION: 1. Apparent solid and cystic mass off the right hepatic dome measuring 5.1 x 4.9 x 5.4 cm. Recommend an MRI of the abdomen with and without contrast for better characterization. 2. Cholelithiasis and mild gallbladder wall thickening. The gallbladder is otherwise unremarkable. 3. The common bile duct is normal.  These results will be called to the ordering clinician or representative by the Radiologist Assistant, and communication documented in the PACS or [employer].  Electronically Signed   By: Dorise Bullion III M.D.   On: 04/28/2022 17:40   Assessment & Plan:   Problem List Items Addressed This Visit     Type 2 diabetes mellitus with other specified complication ([place])    Chronic, stable on metformin and farxiga daily. Last A1c 6.1%.       CKD stage 3 due to type 2 diabetes mellitus (H[facility])    Recent GFR dropped to 49 (previously >60). Upcoming contrasted CT for further evaluation of liver mass. I did ask him to return for labwork after CT to ensure kidney function stable  prior to planned surgery.       Relevant Orders   Renal function panel   Atrial fibrillation ([place])    Has already received cardiac clearance with blood thinner bridging recommendations.       Chronic HFrEF (heart failure with reduced ejection fraction) ([place])    Has already received cardiac clearance.      Vitamin B12 deficiency    He is on regular metformin, should also be on oral b12 1047mg daily.  Will update b12 prior to  surgery as well.       Relevant Orders   Vitamin B12   CCC (chronic calculous cholecystitis)    Appreciate gen surgery care, planned lap chole.       Pre-op evaluation - Primary    RCRI = 3 (15% 30d morbidity/mortality risk).  Has received cardiac clearance.  Will check CXR prior to surgery.  WIll ask him to return for Cr after upcoming contrasted CT. Will check BNP at same time (latest 394 10/2021).  If reassuring, and assuming reassuring CT scan, anticipate adequately compensated to proceed with planned moderate risk surgery.       Relevant Orders   DG Chest 2 View   Liver mass, right lobe    New liver mass incidentally found on latest RUQ [place] planned contrasted CT abd/pelvis next week for further evaluation of this. Discussed possible etiologies including cancer, infection.         No orders of the defined types were placed in this encounter.  Orders Placed This Encounter  Procedures   DG Chest 2 View    Standing Status:   Future    Number of Occurrences:   1    Standing Expiration Date:   05/03/2023    Order Specific Question:   Reason for Exam (SYMPTOM  OR DIAGNOSIS REQUIRED)    Answer:   preop eval    Order Specific Question:   Preferred imaging location?    Answer:   [facility]   Renal function panel    Standing Status:   Future    Standing Expiration Date:   05/04/2023   Vitamin B12    Standing Status:   Future    Standing Expiration Date:   05/04/2023     Patient Instructions  Chest xray today.  Try tylenol PM instead of advil PM.  Increase water by 1-2 glasses a day as kidney function was a bit worse on last check.  Schedule lab visit for a few days after upcoming CT scan (first week of September).  Good to see you today!    Follow up plan: Return if symptoms worsen or fail to improve.  JRia Bush MD

## 2022-05-02 NOTE — Telephone Encounter (Signed)
Called and spoke with the patient's wife. The patient is scheduled for a CT scan of the abdomen and pelvis at [facility], [place] entrance on August 30th, arrival time is 8:30 am. The patient will have clear liquids only for 4 hours prior and will need to pick up a prep kit for this scan. Dr Christian Mate will call him with the results and next steps.

## 2022-05-02 NOTE — Progress Notes (Signed)
Patient ID: [name] Young., male   DOB: Mar 02, 1941, 81 y.o.   MRN: [id number]  Chief Complaint: History of ascending cholangitis  History of Present Illness Follow-up after LFTs and ultrasound. There appears to be an abnormality on his ultrasound involving the dome of the right lobe of the liver, not well defined on last February's imaging.  Additional imaging requested specifically MRI.  Patient would not tolerate an MRI.  He will require sedation for CT scan. [name] Young. is a 81 y.o. male with ascending cholangitis, for which she was hospitalized last February.  Underwent ERCP February 12.  Common bile duct was dilated to 11 mm at that time, sphincterotomy of 10 mm was completed, and the stone cleared, draining bile and sludge.  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  He also reports having vegetations of his mitral valve with endocarditis for which he received antibiotics for a 6-week period.  He subsequently went through rehab.  He reports his cardiologist has cleared him to proceed with elective cholecystectomy. His EF improved with left ventricular ejection fraction of 50 to 55% last noted in May. Currently taking Eliquis, and should be bridged with aspirin through his surgery. Of note he reports that he remains weak, having relearn to walk over the last once.  He reports belching, but is currently not on any diet restrictions.  He does not check his blood sugars but he reports his hemoglobin A1c is good.  He denies any other issues regarding right upper quadrant pain or postprandial pain. No recent jaundice, hematochezia or melena, or acholic stools.  He denies any back pain.   Past Medical History Past Medical History:  Diagnosis Date   Arthritis    Ascending cholangitis 10/2021   Cataract    lens implant bilateral   Chronic kidney disease    kidney stones   Coronary artery disease    Dyspnea    Dysrhythmia     Endocarditis of mitral valve 10/2021   In setting of bacteremia from ascending colangitis   GERD (gastroesophageal reflux disease)    HFrEF (heart failure with reduced ejection fraction) ([place]) 10/2021   LVEF 40-45%   History of cholelithiasis    History of kidney stones    ca ox [name]) now [employer]   History of pneumonia    HLD (hyperlipidemia)    HTN (hypertension)    Jaundice    age 77   Paroxysmal atrial fibrillation ([place])    Pneumonia    years ago    T2DM (type 2 diabetes mellitus) ([place]) 2010      Past Surgical History:  Procedure Laterality Date   CARPAL TUNNEL RELEASE Bilateral    CATARACT EXTRACTION, BILATERAL     COLONOSCOPY  11/2012   11 adenomatous polyps, diverticulosis, rec rpt 1 yr Ardis Hughs)   COLONOSCOPY  12/2013   3 polyps, diverticulosis, rec rpt 3 yrs Ardis Hughs)   COLONOSCOPY  06/2019   6 polyps (TA), diverticulosis, f/u left open ended Ardis Hughs)   CORONARY ARTERY BYPASS GRAFT N/A 01/18/2018   Procedure: CORONARY ARTERY BYPASS GRAFTING (CABG) x 3; Using Left Internal Mammary Artery, and Right Greater Saphenous Vein harvested Endoscopically, Coronary Artery Endarterectomy;  Surgeon: Ivin Poot, MD;  Location: [place];  Service: Open Heart Surgery;  Laterality: N/A;   ERCP N/A 10/23/2021   Procedure: ENDOSCOPIC RETROGRADE CHOLANGIOPANCREATOGRAPHY (ERCP);  Surgeon: Ladene Artist, MD;  Location[facility] ENDOSCOPY;  Service: Endoscopy;  Laterality: N/A;   KNEE CARTILAGE SURGERY Left    LEFT HEART CATH AND CORONARY ANGIOGRAPHY N/A 12/21/2017   Procedure: LEFT HEART CATH AND CORONARY ANGIOGRAPHY;  Surgeon: Nelva Bush, MD;  Location: [place] CV LAB;  Service: Cardiovascular;  Laterality: N/A;   LITHOTRIPSY     REMOVAL OF STONES  10/23/2021   Procedure: REMOVAL OF STONES;  Surgeon: Ladene Artist, MD;  Location: [facility] ENDOSCOPY;  Service: Endoscopy;;   SPHINCTEROTOMY  10/23/2021   Procedure: Joan Mayans;  Surgeon: Ladene Artist, MD;  Location: [place];  Service: Endoscopy;;   TEE WITHOUT CARDIOVERSION N/A 01/18/2018   Procedure: TRANSESOPHAGEAL ECHOCARDIOGRAM (TEE);  Surgeon: Prescott Gum, Collier Salina, MD;  Location: [place];  Service: Open Heart Surgery;  Laterality: N/A;   TEE WITHOUT CARDIOVERSION N/A 10/28/2021   Procedure: TRANSESOPHAGEAL ECHOCARDIOGRAM (TEE);  Surgeon: Pixie Casino, MD;  Location: [facility] ENDOSCOPY;  Service: Cardiovascular;  Laterality: N/A;   UMBILICAL HERNIA REPAIR     with mesh    No Known Allergies  Current Outpatient Medications  Medication Sig Dispense Refill   ACCU-CHEK AVIVA PLUS test strip USE AS DIRECTED TO CHECK BLOOD SUGARS UPTO FOUR TIMES DAILY. 100 each 3   blood glucose meter kit and supplies Dispense based on patient and insurance preference. Use up to four times daily as directed. (FOR ICD-10 E10.9, E11.9). 1 each 0   Cholecalciferol (VITAMIN D3) 25 MCG (1000 UT) CAPS Take 1 capsule (1,000 Units total) by mouth daily. 30 capsule    Cyanocobalamin (B-12) 1000 MCG SUBL Place 1 tablet under the tongue daily.     ELIQUIS 5 MG TABS tablet TAKE 1 TABLET BY MOUTH TWICE A DAY 60 tablet 3   ENTRESTO 24-26 MG TAKE 1 TABLET BY MOUTH TWICE A DAY 60 tablet 4   FARXIGA 5 MG TABS tablet TAKE 1 TABLET BY MOUTH EVERY DAY 30 tablet 3   LORazepam (ATIVAN) 1 MG tablet Take one hour prior to CT scan. 1 tablet 0   metFORMIN (GLUCOPHAGE) 500 MG tablet Take by mouth daily with breakfast.     metoprolol succinate (TOPROL-XL) 50 MG 24 hr tablet Take 1 tablet (50 mg total) by mouth daily. Take with or immediately following a meal. 30 tablet 5   Multiple Vitamins-Minerals (MACULAR VITAMIN BENEFIT PO) Take 1 tablet by mouth daily.     nitroGLYCERIN (NITROSTAT) 0.4 MG SL tablet Place 1 tablet (0.4 mg total) under the tongue every 5 (five) minutes as needed for chest pain. 25 tablet 4   Omega-3 Fatty Acids (FISH OIL) 1200 MG CAPS Take 2 capsules (2,400 mg total) by mouth daily.     omeprazole (PRILOSEC) 40 MG capsule Take 1 capsule  (40 mg total) by mouth every Monday, Wednesday, and Friday. 40 capsule 3   rosuvastatin (CRESTOR) 10 MG tablet TAKE 1 TABLET BY MOUTH EVERY DAY 90 tablet 0   tamsulosin (FLOMAX) 0.4 MG CAPS capsule TAKE 1 CAPSULE BY MOUTH EVERY DAY 30 capsule 6   fenofibrate (TRICOR) 145 MG tablet Take 1 tablet (145 mg total) by mouth daily. 30 tablet 3   No current facility-administered medications for this visit.    Family History Family History  Problem Relation Age of Onset   Alzheimer's disease Mother    Breast cancer Mother        breast   Atrial fibrillation Mother    CAD Mother    Stroke Father 23   Diabetes Father    CAD Father  Colon polyps Sister    Colon polyps Brother    Rectal cancer Maternal Grandfather        rectal   Colon cancer Maternal Grandfather 12   Esophageal cancer Neg Hx    Stomach cancer Neg Hx       Social History Social History   Tobacco Use   Smoking status: Never    Passive exposure: Never   Smokeless tobacco: Former    Types: Chew    Quit date: 09/08/2001  Vaping Use   Vaping Use: Never used  Substance Use Topics   Alcohol use: Not Currently    Comment: occ   Drug use: No        Review of Systems  Constitutional: Negative.   HENT:  Positive for hearing loss.   Eyes: Negative.   Respiratory: Negative.    Cardiovascular: Negative.   Gastrointestinal: Negative.   Genitourinary:  Positive for urgency.  Skin: Negative.   Neurological: Negative.   Psychiatric/Behavioral: Negative.        Physical Exam Blood pressure 134/75, pulse 60, temperature 98.4 F (36.9 C), height 5' 11" (1.803 m), weight 245 lb (111.1 kg), SpO2 98 %. Last Weight  Most recent update: 05/02/2022 11:12 AM    Weight  111.1 kg (245 lb)             CONSTITUTIONAL: Well developed, and nourished, appropriately responsive and aware without distress.   EYES: Sclera non-icteric.   EARS, NOSE, MOUTH AND THROAT:  The oropharynx is clear. Oral mucosa is pink and moist.    Hearing is intact to voice, his voice is at he reports a new baseline, and not his native voice. NECK: Trachea is midline, and there is no jugular venous distension.  LYMPH NODES:  Lymph nodes in the neck are not enlarged. RESPIRATORY:  Lungs are clear, and breath sounds are equal bilaterally. Normal respiratory effort without pathologic use of accessory muscles. CARDIOVASCULAR: Heart is regular in rate and rhythm. GI: The abdomen is  soft, nontender, and nondistended.  He has a wide epigastric diastases.  There were no palpable masses. I did not appreciate hepatosplenomegaly. There were normal bowel sounds.   SKIN: Skin turgor is normal. No pathologic skin lesions appreciated.  NEUROLOGIC:  Motor and sensation appear grossly normal.  Cranial nerves are grossly without defect. PSYCH:  Alert and oriented to person, place and time. Affect is appropriate for situation.  Data Reviewed I have personally reviewed what is currently available of the patient's imaging, recent labs and medical records.   Labs:     Latest Ref Rng & Units 04/25/2022   11:13 AM 12/07/2021    8:33 AM 11/04/2021    4:18 AM  CBC  WBC 4.0 - 10.5 K/uL 8.1  6.7  8.6   Hemoglobin 13.0 - 17.0 g/dL 11.8  10.6  8.4   Hematocrit 39.0 - 52.0 % 38.8  32.3  26.5   Platelets 150 - 400 K/uL 291  287.0  370       Latest Ref Rng & Units 04/25/2022   11:13 AM 12/07/2021    8:33 AM 11/04/2021    4:18 AM  CMP  Glucose 70 - 99 mg/dL 122  128  128   BUN 8 - 23 mg/dL _0 Creatinine 0.61 - 1.24 mg/dL 1.44  1.06  1.14   Sodium 135 - 145 mmol/L 140  139  135   Potassium 3.5 - 5.1 mmol/L 4.2  5.0  3.9   Chloride 98 - 111 mmol/L 110  103  105   CO2 22 - 32 mmol/L _0 Calcium 8.9 - 10.3 mg/dL 9.1  9.4  8.2   Total Protein 6.5 - 8.1 g/dL 7.3  6.8    Total Bilirubin 0.3 - 1.2 mg/dL 0.6  0.7    Alkaline Phos 38 - 126 U/L 36  56    AST 15 - 41 U/L 13  13    ALT 0 - 44 U/L 6  3        Imaging: CLINICAL DATA:  81 year old  male with history of chest and back pain. Suspected aortic dissection.   EXAM: CT ANGIOGRAPHY CHEST, ABDOMEN AND PELVIS   TECHNIQUE: Non-contrast CT of the chest was initially obtained.   Multidetector CT imaging through the chest, abdomen and pelvis was performed using the standard protocol during bolus administration of intravenous contrast. Multiplanar reconstructed images and MIPs were obtained and reviewed to evaluate the vascular anatomy.   RADIATION DOSE REDUCTION: This exam was performed according to the departmental dose-optimization program which includes automated exposure control, adjustment of the mA and/or kV according to patient size and/or use of iterative reconstruction technique.   CONTRAST:  38m OMNIPAQUE IOHEXOL 350 MG/ML SOLN   COMPARISON:  Chest CT 01/17/2018.   FINDINGS: CTA CHEST FINDINGS   Cardiovascular: Precontrast images demonstrate no crescentic high attenuation associated with the wall of the thoracic aorta to suggest the presence of acute intramural hemorrhage. Postcontrast images demonstrate no evidence of thoracic aortic aneurysm or dissection. Heart size is normal. There is no significant pericardial fluid, thickening or pericardial calcification. There is aortic atherosclerosis, as well as atherosclerosis of the great vessels of the mediastinum and the coronary arteries, including calcified atherosclerotic plaque in the left main, left anterior descending, left circumflex and right coronary arteries. Status post median sternotomy for CABG including LIMA to the LAD. Calcifications of the aortic valve.   Mediastinum/Nodes: No pathologically enlarged mediastinal or hilar lymph nodes. Esophagus is unremarkable in appearance. No axillary lymphadenopathy.   Lungs/Pleura: No acute consolidative airspace disease. No pleural effusions. No definite suspicious appearing pulmonary nodules or masses are noted on today's examination which is limited  by considerable patient motion. Dependent areas of subsegmental atelectasis or scarring are noted in the lung bases bilaterally.   Musculoskeletal: Median sternotomy wires. There are no aggressive appearing lytic or blastic lesions noted in the visualized portions of the skeleton.   Review of the MIP images confirms the above findings.   CTA ABDOMEN AND PELVIS FINDINGS   VASCULAR   Aorta: Normal caliber aorta without aneurysm, dissection, vasculitis or significant stenosis.   Celiac: Patent without evidence of aneurysm, dissection, vasculitis or significant stenosis.   SMA: Patent without evidence of aneurysm, dissection, vasculitis or significant stenosis.   Renals: Both renal arteries are patent without evidence of aneurysm, dissection, vasculitis, fibromuscular dysplasia or significant stenosis.   IMA: Patent without evidence of aneurysm, dissection, vasculitis or significant stenosis.   Inflow: Patent without evidence of aneurysm, dissection, vasculitis or significant stenosis.   Veins: No obvious venous abnormality within the limitations of this arterial phase study.   Review of the MIP images confirms the above findings.   NON-VASCULAR   Hepatobiliary: No suspicious cystic or solid hepatic lesions are confidently identified on today's arterial phase examination. Partially calcified gallstone measuring 1.1 cm in diameter lying dependently in the gallbladder. No findings to suggest an acute cholecystitis are noted at  this time. Common bile duct measures 11 mm in the porta hepatis. No calcified stones are identified in the common bile duct. No intrahepatic biliary ductal dilatation.   Pancreas: No pancreatic mass. No pancreatic ductal dilatation. No pancreatic or peripancreatic fluid collections or inflammatory changes.   Spleen: Unremarkable.   Adrenals/Urinary Tract: 5 mm calculus at the right ureteropelvic junction (axial image 191 of series 7). No  proximal hydroureteronephrosis to indicate active obstruction at this time. There is extensive perinephric stranding bilaterally (nonspecific). Low-attenuation lesions in both kidneys, compatible with simple cysts, measuring up to 4.4 cm in the lower pole of the right kidney. Urinary bladder is normal in appearance. Bilateral adrenal glands are normal in appearance.   Stomach/Bowel: Normal appearance of the stomach. No pathologic dilatation of small bowel or colon. Numerous colonic diverticulae are noted, particularly in the sigmoid colon, without definite focal surrounding inflammatory changes to clearly indicate an associated diverticulitis at this time. The appendix is not confidently identified and may be surgically absent. Regardless, there are no inflammatory changes noted adjacent to the cecum to suggest the presence of an acute appendicitis at this time.   Lymphatic: No lymphadenopathy noted in the abdomen or pelvis.   Reproductive: Prostate gland and seminal vesicles are unremarkable in appearance.   Other: No significant volume of ascites.  No pneumoperitoneum.   Musculoskeletal: There are no aggressive appearing lytic or blastic lesions noted in the visualized portions of the skeleton.   Review of the MIP images confirms the above findings.   IMPRESSION: 1. No evidence of acute aortic syndrome. 2. No acute findings in the thorax to account for the patient's symptoms. 3. 5 mm calculus at the right ureteropelvic junction. At this time, there is no proximal hydronephrosis to indicate urinary tract obstruction. 4. Cholelithiasis without evidence of acute cholecystitis. 5. Dilatation of the common bile duct. No intrahepatic biliary ductal dilatation to clearly indicate biliary tract obstruction. Additionally, there is no calcified choledocholithiasis. This is of uncertain etiology and significance, and could be age related, however, correlation with liver function tests  is recommended. If there is clinical concern for biliary tract obstruction, further evaluation with abdominal MRI with and without IV gadolinium with MRCP would be recommended. 6. Aortic atherosclerosis, in addition to left main and three-vessel coronary artery disease. Status post median sternotomy for CABG including LIMA to the LAD. 7. There are calcifications of the aortic valve. Echocardiographic correlation for evaluation of potential valvular dysfunction may be warranted if clinically indicated. 8. Additional incidental findings, as above.     Electronically Signed   By: Vinnie Langton M.D.   On: 10/23/2021 07:20  CLINICAL DATA:  Cholangitis   EXAM: ULTRASOUND ABDOMEN LIMITED RIGHT UPPER QUADRANT   COMPARISON:  CT scan October 23, 2021   FINDINGS: Gallbladder:   Cholelithiasis identified with mild wall thickening measuring 3.6 mm. No Murphy's sign or pericholecystic fluid.   Common bile duct:   Diameter: 3.7 mm   Liver:   Apparent indeterminate mass in the right hepatic dome measuring 5.1 x 4.9 x 5.4 cm. This mass may contain solid and cystic components. Portal vein is patent on color Doppler imaging with normal direction of blood flow towards the liver.   Other: None.   IMPRESSION: 1. Apparent solid and cystic mass off the right hepatic dome measuring 5.1 x 4.9 x 5.4 cm. Recommend an MRI of the abdomen with and without contrast for better characterization. 2. Cholelithiasis and mild gallbladder wall thickening. The gallbladder is otherwise unremarkable.  3. The common bile duct is normal.   These results will be called to the ordering clinician or representative by the Radiologist Assistant, and communication documented in the PACS or [employer].     Electronically Signed   By: Dorise Bullion III M.D.   On: 04/28/2022 17:40  Assessment    Gallstones, relatively asymptomatic however with history of ascending cholangitis with  choledocholithiasis.  Status post ERCP with sphincterotomy.  Currently with normal LFTs, and CBC. Abnormality of dome of right lobe of liver on ultrasound, hopefully CT scan will better define.  Will provide Ativan to help with patient's tolerance of CT scan examination. Patient Active Problem List   Diagnosis Date Noted   CCC (chronic calculous cholecystitis) 04/25/2022   History of acute cholangitis 04/25/2022   Hoarseness 12/22/2021   Infective endocarditis of mitral valve 11/12/2021   Palliative care by specialist    Bacteremia due to Escherichia coli 10/24/2021   Bacteremia due to Streptococcus 10/24/2021   Vitamin B12 deficiency 05/24/2020   Vitamin D deficiency 05/24/2020   Early dry stage nonexudative age-related macular degeneration of both eyes 12/01/2019   Pain due to onychomycosis of toenails of both feet 07/28/2019   Diabetic neuropathy ([place]) 07/28/2019   Hearing loss, right 05/22/2019   Chronic HFrEF (heart failure with reduced ejection fraction) ([place]) 05/24/2018   Atrial fibrillation ([place]) 02/08/2018   Debility 01/28/2018   Anemia    S/P CABG x 3 01/18/2018   Coronary artery disease 12/29/2017   Chronic inactive rheumatic heart disease 11/24/2017   DNR (do not resuscitate) 06/14/2017   Peripheral neuropathy 06/14/2017   Tinea cruris 04/02/2017   Health maintenance examination 12/12/2016   Obesity, Class I, BMI 30-34.9 12/12/2016   Advanced care planning/counseling discussion 10/30/2014   Benign prostatic hyperplasia 10/30/2014   CKD stage 3 due to type 2 diabetes mellitus ([place]) 07/08/2013   Tinea corporis 01/16/2013   Medicare annual wellness visit, subsequent 10/11/2011   Type 2 diabetes mellitus with other specified complication ([place])    Essential hypertension    Hyperlipidemia associated with type 2 diabetes mellitus ([place])    History of kidney stones    GERD (gastroesophageal reflux disease)     Plan    I have ordered a abdominal CT scan with contrast, and  sedation with Ativan to currently evaluate what remains. He strongly desires to proceed and have definitive clearance of his gallstones, concerned that he may have another attack. We discussed robotic cholecystectomy with ICG imaging.  This was discussed thoroughly.  Optimal plan is for robotic cholecystectomy utilizing ICG imaging, pending further evaluation with CT.  We would hold his Eliquis for 2 days preop and continue baby aspirin through the procedure.  Risks and benefits have been discussed with the patient which include but are not limited to anesthesia, bleeding, infection, biliary ductal injury, resulting in leak or stenosis, other associated unanticipated injuries affiliated with laparoscopic surgery.   Reviewed that removing the gallbladder will only address the symptoms related to the gallbladder itself.  I believe there is the desire to proceed, accepting the risks with understanding.  Questions elicited and answered to satisfaction.    No guarantees ever expressed or implied.   Face-to-face time spent with the patient and accompanying care providers(if present) was 50 minutes, with more than 50% of the time spent counseling, educating, and coordinating care of the patient.    These notes generated with voice recognition software. I apologize for typographical errors.  Ronny Bacon M.D., FACS  05/02/2022, 12:11 PM

## 2022-05-02 NOTE — Patient Instructions (Addendum)
Chest xray today.  Try tylenol PM instead of advil PM.  Increase water by 1-2 glasses a day as kidney function was a bit worse on last check.  Schedule lab visit for a few days after upcoming CT scan (first week of September).  Good to see you today!

## 2022-05-02 NOTE — Patient Instructions (Addendum)
We will get you scheduled for a CT scan. We will prescribe medication to take before your scan for anxiety.   We will call you with the results of the CT scan and the next steps.     You have requested to have your gallbladder removed. This will be done at [facility] with Dr. Christian Mate. You will need to stop your Eliquis 2 days before your surgery.   You will most likely be out of work 1-2 weeks for this surgery.  If you have FMLA or disability paperwork that needs filled out you may drop this off at our office or this can be faxed to [phone].  You will return after your post-op appointment with a lifting restriction for approximately 4 more weeks.  You will be able to eat anything you would like to following surgery. But, start by eating a bland diet and advance this as tolerated. The Gallbladder diet is below, please go as closely by this diet as possible prior to surgery to avoid any further attacks.  Please see the (blue)pre-care form that you have been given today. Our surgery scheduler will call you to verify surgery date and to go over information.   If you have any questions, please call our office.  Laparoscopic Cholecystectomy Laparoscopic cholecystectomy is surgery to remove the gallbladder. The gallbladder is located in the upper right part of the abdomen, behind the liver. It is a storage sac for bile, which is produced in the liver. Bile aids in the digestion and absorption of fats. Cholecystectomy is often done for inflammation of the gallbladder (cholecystitis). This condition is usually caused by a buildup of gallstones (cholelithiasis) in the gallbladder. Gallstones can block the flow of bile, and that can result in inflammation and pain. In severe cases, emergency surgery may be required. If emergency surgery is not required, you will have time to prepare for the procedure. Laparoscopic surgery is an alternative to open surgery. Laparoscopic surgery has a  shorter recovery time. Your common bile duct may also need to be examined during the procedure. If stones are found in the common bile duct, they may be removed. LET [facility] CARE PROVIDER KNOW ABOUT: Any allergies you have. All medicines you are taking, including vitamins, herbs, eye drops, creams, and over-the-counter medicines. Previous problems you or members of your family have had with the use of anesthetics. Any blood disorders you have. Previous surgeries you have had.  Any medical conditions you have. RISKS AND COMPLICATIONS Generally, this is a safe procedure. However, problems may occur, including: Infection. Bleeding. Allergic reactions to medicines. Damage to other structures or organs. A stone remaining in the common bile duct. A bile leak from the cyst duct that is clipped when your gallbladder is removed. The need to convert to open surgery, which requires a larger incision in the abdomen. This may be necessary if your surgeon thinks that it is not safe to continue with a laparoscopic procedure. BEFORE THE PROCEDURE Ask your health care provider about: Changing or stopping your regular medicines. This is especially important if you are taking diabetes medicines or blood thinners. Taking medicines such as aspirin and ibuprofen. These medicines can thin your blood. Do not take these medicines before your procedure if your health care provider instructs you not to. Follow instructions from your health care provider about eating or drinking restrictions. Let your health care provider know if you develop a cold or an infection before surgery. Plan to have someone take you  home after the procedure. Ask your health care provider how your surgical site will be marked or identified. You may be given antibiotic medicine to help prevent infection. PROCEDURE To reduce your risk of infection: Your health care team will wash or sanitize their hands. Your skin will be washed with  soap. An IV tube may be inserted into one of your veins. You will be given a medicine to make you fall asleep (general anesthetic). A breathing tube will be placed in your mouth. The surgeon will make several small cuts (incisions) in your abdomen. A thin, lighted tube (laparoscope) that has a tiny camera on the end will be inserted through one of the small incisions. The camera on the laparoscope will send a picture to a TV screen (monitor) in the operating room. This will give the surgeon a good view inside your abdomen. A gas will be pumped into your abdomen. This will expand your abdomen to give the surgeon more room to perform the surgery. Other tools that are needed for the procedure will be inserted through the other incisions. The gallbladder will be removed through one of the incisions. After your gallbladder has been removed, the incisions will be closed with stitches (sutures), staples, or skin glue. Your incisions may be covered with a bandage (dressing). The procedure may vary among health care providers and hospitals. AFTER THE PROCEDURE Your blood pressure, heart rate, breathing rate, and blood oxygen level will be monitored often until the medicines you were given have worn off. You will be given medicines as needed to control your pain.   This information is not intended to replace advice given to you by your health care provider. Make sure you discuss any questions you have with your health care provider.   Document Released: 08/28/2005 Document Revised: 05/19/2015 Document Reviewed: 04/09/2013 Elsevier Interactive Patient Education 2016 [place] Diet for Gallbladder Conditions A low-fat diet can be helpful if you have pancreatitis or a gallbladder condition. With these conditions, your pancreas and gallbladder have trouble digesting fats. A healthy eating plan with less fat will help rest your pancreas and gallbladder and reduce your symptoms. WHAT DO I NEED  TO KNOW ABOUT THIS DIET? Eat a low-fat diet. Reduce your fat intake to less than 20-30% of your total daily calories. This is less than 50-60 g of fat per day. Remember that you need some fat in your diet. Ask your dietician what your daily goal should be. Choose nonfat and low-fat healthy foods. Look for the words "nonfat," "low fat," or "fat free." As a guide, look on the label and choose foods with less than 3 g of fat per serving. Eat only one serving. Avoid alcohol. Do not smoke. If you need help quitting, talk with your health care provider. Eat small frequent meals instead of three large heavy meals. WHAT FOODS CAN I EAT? Grains Include healthy grains and starches such as potatoes, wheat bread, fiber-rich cereal, and brown rice. Choose whole grain options whenever possible. In adults, whole grains should account for 45-65% of your daily calories.  Fruits and Vegetables Eat plenty of fruits and vegetables. Fresh fruits and vegetables add fiber to your diet. Meats and Other Protein Sources Eat lean meat such as chicken and pork. Trim any fat off of meat before cooking it. Eggs, fish, and beans are other sources of protein. In adults, these foods should account for 10-35% of your daily calories. Dairy Choose low-fat milk and dairy options. Dairy includes  fat and protein, as well as calcium.  Fats and Oils Limit high-fat foods such as fried foods, sweets, baked goods, sugary drinks.  Other Creamy sauces and condiments, such as mayonnaise, can add extra fat. Think about whether or not you need to use them, or use smaller amounts or low fat options. WHAT FOODS ARE NOT RECOMMENDED? High fat foods, such as: [employer]. Ice cream. [place] toast. Sweet rolls. Pizza. Cheese bread. Foods covered with batter, butter, creamy sauces, or cheese. Fried foods. Sugary drinks and desserts. Foods that cause gas or bloating   This information is not intended to replace advice given to you by your  health care provider. Make sure you discuss any questions you have with your health care provider.   Document Released: 09/02/2013 Document Reviewed: 09/02/2013 Elsevier Interactive Patient Education [employer].

## 2022-05-03 DIAGNOSIS — Z01818 Encounter for other preprocedural examination: Secondary | ICD-10-CM | POA: Insufficient documentation

## 2022-05-03 DIAGNOSIS — R160 Hepatomegaly, not elsewhere classified: Secondary | ICD-10-CM | POA: Insufficient documentation

## 2022-05-03 NOTE — Assessment & Plan Note (Addendum)
Recent GFR dropped to 49 (previously >60). Upcoming contrasted CT for further evaluation of liver mass. I did ask him to return for labwork after CT to ensure kidney function stable prior to planned surgery.

## 2022-05-03 NOTE — Assessment & Plan Note (Addendum)
New liver mass incidentally found on latest RUQ [place], planned contrasted CT abd/pelvis next week for further evaluation of this. Discussed possible etiologies including cancer, infection.

## 2022-05-03 NOTE — Assessment & Plan Note (Addendum)
Appreciate gen surgery care, planned lap chole.

## 2022-05-03 NOTE — Assessment & Plan Note (Signed)
Has already received cardiac clearance with blood thinner bridging recommendations.

## 2022-05-03 NOTE — Assessment & Plan Note (Signed)
He is on regular metformin, should also be on oral b12 1072mg daily.  Will update b12 prior to surgery as well.

## 2022-05-03 NOTE — Assessment & Plan Note (Signed)
Has already received cardiac clearance.

## 2022-05-03 NOTE — Assessment & Plan Note (Addendum)
RCRI = 3 (15% 30d morbidity/mortality risk).  Has received cardiac clearance.  Will check CXR prior to surgery.  WIll ask him to return for Cr after upcoming contrasted CT. Will check BNP at same time (latest 394 10/2021).  If reassuring, and assuming reassuring CT scan, anticipate adequately compensated to proceed with planned moderate risk surgery.

## 2022-05-05 NOTE — Telephone Encounter (Signed)
Faxed form, OV notes and results to [facility] at [phone].

## 2022-05-08 ENCOUNTER — Other Ambulatory Visit: Payer: Self-pay | Admitting: Surgery

## 2022-05-08 ENCOUNTER — Telehealth: Payer: Self-pay | Admitting: *Deleted

## 2022-05-08 ENCOUNTER — Other Ambulatory Visit: Payer: Self-pay | Admitting: Internal Medicine

## 2022-05-08 MED ORDER — DIAZEPAM 10 MG PO TABS: 10.0000 mg | ORAL_TABLET | Freq: Once | ORAL | 0 refills | Status: AC

## 2022-05-08 NOTE — Telephone Encounter (Signed)
Medical clearance received from Ria Bush MD -states patient has received cardiac clearance for Eliquis and bridge instructions.

## 2022-05-08 NOTE — Telephone Encounter (Signed)
Patient called and was prescribed Ativan to take before he his CT scan and he stated that the pharmacy said it was on back order and they would not get it in any time soon. Can we call in something different?

## 2022-05-08 NOTE — Progress Notes (Signed)
No ativan for CT sedation, will change to Valium.

## 2022-05-09 ENCOUNTER — Telehealth: Payer: Self-pay | Admitting: Family Medicine

## 2022-05-09 NOTE — Telephone Encounter (Signed)
Patients wife declined AWV. Will discuss with Dr. Darnell Level

## 2022-05-10 ENCOUNTER — Ambulatory Visit
Admission: RE | Admit: 2022-05-10 | Discharge: 2022-05-10 | Disposition: A | Discharge status: Home / Self Care | Payer: Medicare PPO | Source: Ambulatory Visit | Attending: Surgery | Admitting: Surgery

## 2022-05-10 DIAGNOSIS — K8020 Calculus of gallbladder without cholecystitis without obstruction: Secondary | ICD-10-CM | POA: Diagnosis not present

## 2022-05-10 DIAGNOSIS — N200 Calculus of kidney: Secondary | ICD-10-CM | POA: Diagnosis not present

## 2022-05-10 DIAGNOSIS — R160 Hepatomegaly, not elsewhere classified: Secondary | ICD-10-CM | POA: Diagnosis not present

## 2022-05-10 DIAGNOSIS — N281 Cyst of kidney, acquired: Secondary | ICD-10-CM | POA: Diagnosis not present

## 2022-05-10 MED ORDER — IOHEXOL 300 MG/ML  SOLN
100.0000 mL | Freq: Once | INTRAMUSCULAR | Status: AC | PRN
  Administered 2022-05-10: 100 mL via INTRAVENOUS

## 2022-05-10 NOTE — Telephone Encounter (Signed)
Noted. It was too early to schedule anyway. Will discuss at [place] 06/2022

## 2022-05-17 ENCOUNTER — Other Ambulatory Visit (INDEPENDENT_AMBULATORY_CARE_PROVIDER_SITE_OTHER): Payer: Medicare PPO

## 2022-05-17 DIAGNOSIS — E538 Deficiency of other specified B group vitamins: Secondary | ICD-10-CM | POA: Diagnosis not present

## 2022-05-17 DIAGNOSIS — N183 Chronic kidney disease, stage 3 unspecified: Secondary | ICD-10-CM | POA: Diagnosis not present

## 2022-05-17 DIAGNOSIS — E1122 Type 2 diabetes mellitus with diabetic chronic kidney disease: Secondary | ICD-10-CM

## 2022-05-17 LAB — RENAL FUNCTION PANEL
Albumin: 3.6 g/dL (ref 3.5–5.2)
BUN: 23 mg/dL (ref 6–23)
CO2: 25 mEq/L (ref 19–32)
Calcium: 9.4 mg/dL (ref 8.4–10.5)
Chloride: 104 mEq/L (ref 96–112)
Creatinine, Ser: 1.33 mg/dL (ref 0.40–1.50)
GFR: 50.7 mL/min — ABNORMAL LOW (ref >60.00)
Glucose, Bld: 109 mg/dL — ABNORMAL HIGH (ref 70–99)
Phosphorus: 3.7 mg/dL (ref 2.3–4.6)
Potassium: 4.5 mEq/L (ref 3.5–5.1)
Sodium: 139 mEq/L (ref 135–145)

## 2022-05-17 LAB — VITAMIN B12: Vitamin B-12: 470 pg/mL (ref 211–911)

## 2022-05-18 ENCOUNTER — Ambulatory Visit: Payer: Self-pay | Admitting: Surgery

## 2022-05-18 ENCOUNTER — Ambulatory Visit: Payer: Medicare PPO | Admitting: Surgery

## 2022-05-18 ENCOUNTER — Encounter: Payer: Self-pay | Admitting: Surgery

## 2022-05-18 VITALS — BP 129/69 | HR 58 | Temp 97.7°F | Wt 243.2 lb

## 2022-05-18 DIAGNOSIS — K8010 Calculus of gallbladder with chronic cholecystitis without obstruction: Secondary | ICD-10-CM

## 2022-05-18 DIAGNOSIS — Z8719 Personal history of other diseases of the digestive system: Secondary | ICD-10-CM

## 2022-05-18 NOTE — Progress Notes (Signed)
Patient ID: [name] Young., male   DOB: 05-Feb-1941, 81 y.o.   MRN: [id number]  Chief Complaint: History of ascending cholangitis  History of Present Illness Follow-up after CT scan examination. We discussed the CT findings, differential diagnoses, and potential impact it may play on proceeding with robotic cholecystectomy.  He still desires to proceed. Follow-up after LFTs and ultrasound. There appears to be an abnormality on his ultrasound involving the dome of the right lobe of the liver, not well defined on last February's imaging.  Additional imaging requested specifically MRI.  Patient would not tolerate an MRI.  He will require sedation for CT scan. [name] Young. is a 81 y.o. male with ascending cholangitis, for which she was hospitalized last February.  Underwent ERCP February 12.  Common bile duct was dilated to 11 mm at that time, sphincterotomy of 10 mm was completed, and the stone cleared, draining bile and sludge.  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  He also reports having vegetations of his mitral valve with endocarditis for which he received antibiotics for a 6-week period.  He subsequently went through rehab.  He reports his cardiologist has cleared him to proceed with elective cholecystectomy. His EF improved with left ventricular ejection fraction of 50 to 55% last noted in May. Currently taking Eliquis, and should be bridged with aspirin through his surgery. Of note he reports that he remains weak, having relearn to walk over the last once.  He reports belching, but is currently not on any diet restrictions.  He does not check his blood sugars but he reports his hemoglobin A1c is good.  He denies any other issues regarding right upper quadrant pain or postprandial pain. No recent jaundice, hematochezia or melena, or acholic stools.  He denies any back pain.   Past Medical History Past Medical History:   Diagnosis Date   Arthritis    Ascending cholangitis 10/2021   Cataract    lens implant bilateral   Chronic kidney disease    kidney stones   Coronary artery disease    Dyspnea    Dysrhythmia    Endocarditis of mitral valve 10/2021   In setting of bacteremia from ascending colangitis   GERD (gastroesophageal reflux disease)    HFrEF (heart failure with reduced ejection fraction) ([place]) 10/2021   LVEF 40-45%   History of cholelithiasis    History of kidney stones    ca ox [name]) now [employer]   History of pneumonia    HLD (hyperlipidemia)    HTN (hypertension)    Jaundice    age 20   Paroxysmal atrial fibrillation ([place])    Pneumonia    years ago    T2DM (type 2 diabetes mellitus) ([place]) 2010      Past Surgical History:  Procedure Laterality Date   CARPAL TUNNEL RELEASE Bilateral    CATARACT EXTRACTION, BILATERAL     COLONOSCOPY  11/2012   11 adenomatous polyps, diverticulosis, rec rpt 1 yr Ardis Hughs)   COLONOSCOPY  12/2013   3 polyps, diverticulosis, rec rpt 3 yrs Ardis Hughs)   COLONOSCOPY  06/2019   6 polyps (TA), diverticulosis, f/u left open ended Ardis Hughs)   CORONARY ARTERY BYPASS GRAFT N/A 01/18/2018   Procedure: CORONARY ARTERY BYPASS GRAFTING (CABG) x 3; Using Left Internal Mammary Artery, and Right Greater Saphenous Vein harvested Endoscopically, Coronary Artery Endarterectomy;  Surgeon: Ivin Poot, MD;  Location: [facility];  Service: Open Heart Surgery;  Laterality: N/A;   ERCP N/A 10/23/2021   Procedure: ENDOSCOPIC RETROGRADE CHOLANGIOPANCREATOGRAPHY (ERCP);  Surgeon: Ladene Artist, MD;  Location: [facility] ENDOSCOPY;  Service: Endoscopy;  Laterality: N/A;   KNEE CARTILAGE SURGERY Left    LEFT HEART CATH AND CORONARY ANGIOGRAPHY N/A 12/21/2017   Procedure: LEFT HEART CATH AND CORONARY ANGIOGRAPHY;  Surgeon: Nelva Bush, MD;  Location: [facility];  Service: Cardiovascular;  Laterality: N/A;   LITHOTRIPSY     REMOVAL OF STONES  10/23/2021    Procedure: REMOVAL OF STONES;  Surgeon: Ladene Artist, MD;  Location: [facility] ENDOSCOPY;  Service: Endoscopy;;   SPHINCTEROTOMY  10/23/2021   Procedure: Joan Mayans;  Surgeon: Ladene Artist, MD;  Location: [facility];  Service: Endoscopy;;   TEE WITHOUT CARDIOVERSION N/A 01/18/2018   Procedure: TRANSESOPHAGEAL ECHOCARDIOGRAM (TEE);  Surgeon: Prescott Gum, Collier Salina, MD;  Location: [facility];  Service: Open Heart Surgery;  Laterality: N/A;   TEE WITHOUT CARDIOVERSION N/A 10/28/2021   Procedure: TRANSESOPHAGEAL ECHOCARDIOGRAM (TEE);  Surgeon: Pixie Casino, MD;  Location: [facility] ENDOSCOPY;  Service: Cardiovascular;  Laterality: N/A;   UMBILICAL HERNIA REPAIR     with mesh    No Known Allergies  Current Outpatient Medications  Medication Sig Dispense Refill   ACCU-CHEK AVIVA PLUS test strip USE AS DIRECTED TO CHECK BLOOD SUGARS UPTO FOUR TIMES DAILY. 100 each 3   blood glucose meter kit and supplies Dispense based on patient and insurance preference. Use up to four times daily as directed. (FOR ICD-10 E10.9, E11.9). 1 each 0   Cholecalciferol (VITAMIN D3) 25 MCG (1000 UT) CAPS Take 1 capsule (1,000 Units total) by mouth daily. 30 capsule    Cyanocobalamin (B-12) 1000 MCG SUBL Place 1 tablet under the tongue daily.     ELIQUIS 5 MG TABS tablet TAKE 1 TABLET BY MOUTH TWICE A DAY 60 tablet 3   ENTRESTO 24-26 MG TAKE 1 TABLET BY MOUTH TWICE A DAY 60 tablet 4   FARXIGA 5 MG TABS tablet TAKE 1 TABLET BY MOUTH EVERY DAY 30 tablet 3   fenofibrate (TRICOR) 145 MG tablet TAKE 1 TABLET BY MOUTH EVERY DAY 90 tablet 0   metFORMIN (GLUCOPHAGE) 500 MG tablet Take by mouth daily with breakfast.     metoprolol succinate (TOPROL-XL) 50 MG 24 hr tablet Take 1 tablet (50 mg total) by mouth daily. Take with or immediately following a meal. 30 tablet 5   Multiple Vitamins-Minerals (MACULAR VITAMIN BENEFIT PO) Take 1 tablet by mouth daily.     nitroGLYCERIN (NITROSTAT) 0.4 MG SL tablet Place 1 tablet (0.4 mg total) under the  tongue every 5 (five) minutes as needed for chest pain. 25 tablet 4   Omega-3 Fatty Acids (FISH OIL) 1200 MG CAPS Take 2 capsules (2,400 mg total) by mouth daily.     omeprazole (PRILOSEC) 40 MG capsule Take 1 capsule (40 mg total) by mouth every Monday, Wednesday, and Friday. 40 capsule 3   rosuvastatin (CRESTOR) 10 MG tablet TAKE 1 TABLET BY MOUTH EVERY DAY 90 tablet 0   tamsulosin (FLOMAX) 0.4 MG CAPS capsule TAKE 1 CAPSULE BY MOUTH EVERY DAY 30 capsule 6   No current facility-administered medications for this visit.    Family History Family History  Problem Relation Age of Onset   Alzheimer's disease Mother    Breast cancer Mother        breast   Atrial fibrillation Mother    CAD Mother    Stroke Father  37   Diabetes Father    CAD Father    Colon polyps Sister    Colon polyps Brother    Rectal cancer Maternal Grandfather        rectal   Colon cancer Maternal Grandfather 80   Esophageal cancer Neg Hx    Stomach cancer Neg Hx       Social History Social History   Tobacco Use   Smoking status: Never    Passive exposure: Never   Smokeless tobacco: Former    Types: Chew    Quit date: 09/08/2001  Vaping Use   Vaping Use: Never used  Substance Use Topics   Alcohol use: Not Currently    Comment: occ   Drug use: No        Review of Systems  Constitutional: Negative.   HENT:  Positive for hearing loss.   Eyes: Negative.   Respiratory: Negative.    Cardiovascular: Negative.   Gastrointestinal: Negative.   Genitourinary:  Positive for urgency.  Skin: Negative.   Neurological: Negative.   Psychiatric/Behavioral: Negative.        Physical Exam Blood pressure 129/69, pulse (!) 58, temperature 97.7 F (36.5 C), temperature source Oral, weight 243 lb 3.2 oz (110.3 kg), SpO2 97 %. Last Weight  Most recent update: 05/18/2022 10:08 AM    Weight  110.3 kg (243 lb 3.2 oz)             CONSTITUTIONAL: Well developed, and nourished, appropriately responsive and  aware without distress.   EYES: Sclera non-icteric.   EARS, NOSE, MOUTH AND THROAT:  The oropharynx is clear. Oral mucosa is pink and moist.   Hearing is intact to voice, his voice is at he reports a new baseline, and not his native voice. NECK: Trachea is midline, and there is no jugular venous distension.  LYMPH NODES:  Lymph nodes in the neck are not enlarged. RESPIRATORY:  Lungs are clear, and breath sounds are equal bilaterally. Normal respiratory effort without pathologic use of accessory muscles. CARDIOVASCULAR: Heart is regular in rate and rhythm. GI: The abdomen is  soft, nontender, and nondistended.  He has a wide epigastric diastases.  There were no palpable masses. I did not appreciate hepatosplenomegaly. There were normal bowel sounds.   SKIN: Skin turgor is normal. No pathologic skin lesions appreciated.  NEUROLOGIC:  Motor and sensation appear grossly normal.  Cranial nerves are grossly without defect. PSYCH:  Alert and oriented to person, place and time. Affect is appropriate for situation.  Data Reviewed I have personally reviewed what is currently available of the patient's imaging, recent labs and medical records.   Labs:     Latest Ref Rng & Units 04/25/2022   11:13 AM 12/07/2021    8:33 AM 11/04/2021    4:18 AM  CBC  WBC 4.0 - 10.5 K/uL 8.1  6.7  8.6   Hemoglobin 13.0 - 17.0 g/dL 11.8  10.6  8.4   Hematocrit 39.0 - 52.0 % 38.8  32.3  26.5   Platelets 150 - 400 K/uL 291  287.0  370       Latest Ref Rng & Units 05/17/2022    7:59 AM 04/25/2022   11:13 AM 12/07/2021    8:33 AM  CMP  Glucose 70 - 99 mg/dL 109  122  128   BUN 6 - 23 mg/dL _0 Creatinine 0.40 - 1.50 mg/dL 1.33  1.44  1.06   Sodium 135 - 145 mEq/L  139  140  139   Potassium 3.5 - 5.1 mEq/L 4.5  4.2  5.0   Chloride 96 - 112 mEq/L 104  110  103   CO2 19 - 32 mEq/L _0 Calcium 8.4 - 10.5 mg/dL 9.4  9.1  9.4   Total Protein 6.5 - 8.1 g/dL  7.3  6.8   Total Bilirubin 0.3 - 1.2 mg/dL  0.6   0.7   Alkaline Phos 38 - 126 U/L  36  56   AST 15 - 41 U/L  13  13   ALT 0 - 44 U/L  6  3       Imaging: CLINICAL DATA:  81 year old male with history of chest and back pain. Suspected aortic dissection.   EXAM: CT ANGIOGRAPHY CHEST, ABDOMEN AND PELVIS   TECHNIQUE: Non-contrast CT of the chest was initially obtained.   Multidetector CT imaging through the chest, abdomen and pelvis was performed using the standard protocol during bolus administration of intravenous contrast. Multiplanar reconstructed images and MIPs were obtained and reviewed to evaluate the vascular anatomy.   RADIATION DOSE REDUCTION: This exam was performed according to the departmental dose-optimization program which includes automated exposure control, adjustment of the mA and/or kV according to patient size and/or use of iterative reconstruction technique.   CONTRAST:  66m OMNIPAQUE IOHEXOL 350 MG/ML SOLN   COMPARISON:  Chest CT 01/17/2018.   FINDINGS: CTA CHEST FINDINGS   Cardiovascular: Precontrast images demonstrate no crescentic high attenuation associated with the wall of the thoracic aorta to suggest the presence of acute intramural hemorrhage. Postcontrast images demonstrate no evidence of thoracic aortic aneurysm or dissection. Heart size is normal. There is no significant pericardial fluid, thickening or pericardial calcification. There is aortic atherosclerosis, as well as atherosclerosis of the great vessels of the mediastinum and the coronary arteries, including calcified atherosclerotic plaque in the left main, left anterior descending, left circumflex and right coronary arteries. Status post median sternotomy for CABG including LIMA to the LAD. Calcifications of the aortic valve.   Mediastinum/Nodes: No pathologically enlarged mediastinal or hilar lymph nodes. Esophagus is unremarkable in appearance. No axillary lymphadenopathy.   Lungs/Pleura: No acute consolidative airspace  disease. No pleural effusions. No definite suspicious appearing pulmonary nodules or masses are noted on today's examination which is limited by considerable patient motion. Dependent areas of subsegmental atelectasis or scarring are noted in the lung bases bilaterally.   Musculoskeletal: Median sternotomy wires. There are no aggressive appearing lytic or blastic lesions noted in the visualized portions of the skeleton.   Review of the MIP images confirms the above findings.   CTA ABDOMEN AND PELVIS FINDINGS   VASCULAR   Aorta: Normal caliber aorta without aneurysm, dissection, vasculitis or significant stenosis.   Celiac: Patent without evidence of aneurysm, dissection, vasculitis or significant stenosis.   SMA: Patent without evidence of aneurysm, dissection, vasculitis or significant stenosis.   Renals: Both renal arteries are patent without evidence of aneurysm, dissection, vasculitis, fibromuscular dysplasia or significant stenosis.   IMA: Patent without evidence of aneurysm, dissection, vasculitis or significant stenosis.   Inflow: Patent without evidence of aneurysm, dissection, vasculitis or significant stenosis.   Veins: No obvious venous abnormality within the limitations of this arterial phase study.   Review of the MIP images confirms the above findings.   NON-VASCULAR   Hepatobiliary: No suspicious cystic or solid hepatic lesions are confidently identified on today's arterial phase examination. Partially calcified gallstone measuring 1.1 cm in  diameter lying dependently in the gallbladder. No findings to suggest an acute cholecystitis are noted at this time. Common bile duct measures 11 mm in the porta hepatis. No calcified stones are identified in the common bile duct. No intrahepatic biliary ductal dilatation.   Pancreas: No pancreatic mass. No pancreatic ductal dilatation. No pancreatic or peripancreatic fluid collections or inflammatory changes.    Spleen: Unremarkable.   Adrenals/Urinary Tract: 5 mm calculus at the right ureteropelvic junction (axial image 191 of series 7). No proximal hydroureteronephrosis to indicate active obstruction at this time. There is extensive perinephric stranding bilaterally (nonspecific). Low-attenuation lesions in both kidneys, compatible with simple cysts, measuring up to 4.4 cm in the lower pole of the right kidney. Urinary bladder is normal in appearance. Bilateral adrenal glands are normal in appearance.   Stomach/Bowel: Normal appearance of the stomach. No pathologic dilatation of small bowel or colon. Numerous colonic diverticulae are noted, particularly in the sigmoid colon, without definite focal surrounding inflammatory changes to clearly indicate an associated diverticulitis at this time. The appendix is not confidently identified and may be surgically absent. Regardless, there are no inflammatory changes noted adjacent to the cecum to suggest the presence of an acute appendicitis at this time.   Lymphatic: No lymphadenopathy noted in the abdomen or pelvis.   Reproductive: Prostate gland and seminal vesicles are unremarkable in appearance.   Other: No significant volume of ascites.  No pneumoperitoneum.   Musculoskeletal: There are no aggressive appearing lytic or blastic lesions noted in the visualized portions of the skeleton.   Review of the MIP images confirms the above findings.   IMPRESSION: 1. No evidence of acute aortic syndrome. 2. No acute findings in the thorax to account for the patient's symptoms. 3. 5 mm calculus at the right ureteropelvic junction. At this time, there is no proximal hydronephrosis to indicate urinary tract obstruction. 4. Cholelithiasis without evidence of acute cholecystitis. 5. Dilatation of the common bile duct. No intrahepatic biliary ductal dilatation to clearly indicate biliary tract obstruction. Additionally, there is no calcified  choledocholithiasis. This is of uncertain etiology and significance, and could be age related, however, correlation with liver function tests is recommended. If there is clinical concern for biliary tract obstruction, further evaluation with abdominal MRI with and without IV gadolinium with MRCP would be recommended. 6. Aortic atherosclerosis, in addition to left main and three-vessel coronary artery disease. Status post median sternotomy for CABG including LIMA to the LAD. 7. There are calcifications of the aortic valve. Echocardiographic correlation for evaluation of potential valvular dysfunction may be warranted if clinically indicated. 8. Additional incidental findings, as above.     Electronically Signed   By: Vinnie Langton M.D.   On: 10/23/2021 07:20  CLINICAL DATA:  Cholangitis   EXAM: ULTRASOUND ABDOMEN LIMITED RIGHT UPPER QUADRANT   COMPARISON:  CT scan October 23, 2021   FINDINGS: Gallbladder:   Cholelithiasis identified with mild wall thickening measuring 3.6 mm. No Murphy's sign or pericholecystic fluid.   Common bile duct:   Diameter: 3.7 mm   Liver:   Apparent indeterminate mass in the right hepatic dome measuring 5.1 x 4.9 x 5.4 cm. This mass may contain solid and cystic components. Portal vein is patent on color Doppler imaging with normal direction of blood flow towards the liver.   Other: None.   IMPRESSION: 1. Apparent solid and cystic mass off the right hepatic dome measuring 5.1 x 4.9 x 5.4 cm. Recommend an MRI of the abdomen with and without  contrast for better characterization. 2. Cholelithiasis and mild gallbladder wall thickening. The gallbladder is otherwise unremarkable. 3. The common bile duct is normal.   These results will be called to the ordering clinician or representative by the Radiologist Assistant, and communication documented in the PACS or [employer].     Electronically Signed   By: Dorise Bullion III M.D.    On: 04/28/2022 17:40  CLINICAL DATA:  Further evaluation of hepatic dome lesion seen on ultrasound.   EXAM: CT ABDOMEN AND PELVIS WITH CONTRAST   TECHNIQUE: Multidetector CT imaging of the abdomen and pelvis was performed using the standard protocol following bolus administration of intravenous contrast.   RADIATION DOSE REDUCTION: This exam was performed according to the departmental dose-optimization program which includes automated exposure control, adjustment of the mA and/or kV according to patient size and/or use of iterative reconstruction technique.   CONTRAST:  147m OMNIPAQUE IOHEXOL 300 MG/ML  SOLN   COMPARISON:  CT October 23, 2021 and ultrasound April 28, 2022   FINDINGS: Lower chest: Small right pleural effusion.  Median sternotomy wires.   Hepatobiliary: Hypodense lesion in the dome of the liver demonstrates an irregular, thickened enhancing wall and measures 5.9 x 4.3 cm on image 22/2 corresponding with the lesion seen on recent ultrasound. Pneumobilia reflect sequela of ampullary access and is consistent with duct patency. There is some atrophy of the lateral left lobe of the liver with some associated mild intrahepatic ductal dilation best seen on delayed postcontrast imaging for instance image 28/12. Cholelithiasis without findings of acute cholecystitis.   Pancreas: No pancreatic ductal dilation or evidence of acute inflammation.   Spleen: No splenomegaly or focal splenic lesion.   Adrenals/Urinary Tract: Bilateral adrenal glands appear normal. No hydronephrosis. Nonobstructive right renal stones measure up to 3-4 mm. Bilateral hypodense renal lesions measure fluid density and are consistent with cysts as well as additional bilateral subcentimeter hypodense renal lesions which are technically too small to accurately characterize but statistically likely reflect cysts, these are considered benign and requiring no independent imaging follow-up.  Urinary bladder is unremarkable for degree of distension.   Stomach/Bowel: Radiopaque enteric contrast material traverses the descending colon. Small hiatal hernia otherwise the stomach is unremarkable for degree of distension. No pathologic dilation of small or large bowel. No evidence of acute bowel inflammation. Left-sided colonic diverticulosis without findings of acute diverticulitis.   Vascular/Lymphatic: Aortic atherosclerosis. No pathologically enlarged abdominal or pelvic lymph nodes.   Reproductive: Prostate is unremarkable.   Other: No significant abdominopelvic free fluid.   Musculoskeletal: No acute osseous abnormality. Multilevel degenerative changes spine. Degenerative change of the bilateral hips and SI joints. Chronic osseous changes of the pubic symphysis.   IMPRESSION: 1. Thick-walled lesion in the dome of the liver is hypodense measuring close to fluid density, with imaging characteristics most consistent with an intrahepatic abscess. Suggest further evaluation with direct fluid sampling. 2. Relative atrophy of the lateral left lobe of the liver with some associated mild segmental intrahepatic ductal dilation, nonspecific possibly reflecting sequela of prior infection/inflammation. Consider assessment of stability and more definitive characterization by hepatic protocol MRI with and without contrast in 3-6 months. 3. Cholelithiasis without findings of acute cholecystitis. 4. Left-sided colonic diverticulosis without findings of acute diverticulitis. 5. Nonobstructive right renal stones. 6.  Aortic Atherosclerosis (ICD10-I70.0).   These results will be called to the ordering clinician or representative by the Radiologist Assistant, and communication documented in the PACS or [employer]     Electronically Signed  By: Dahlia Bailiff M.D.   On: 05/10/2022 15:03 Assessment    Gallstones, relatively asymptomatic however with history of ascending  cholangitis with choledocholithiasis.  Status post ERCP with sphincterotomy.  Currently with normal LFTs, and CBC. Abnormality of dome of right lobe of liver on ultrasound, CT scan consistent with intrahepatic abscess, however this is inconsistent with his clinical presentation at present.  Uncertain the degree of added difficulty this may create for proceeding with cholecystectomy.  I am uncertain that it will even be appreciated during the cholecystectomy.  If accessible it may be sampled or aspirated.   Patient Active Problem List   Diagnosis Date Noted   Pre-op evaluation 05/03/2022   Liver mass, right lobe 05/03/2022   CCC (chronic calculous cholecystitis) 04/25/2022   History of acute cholangitis 04/25/2022   Hoarseness 12/22/2021   Infective endocarditis of mitral valve 11/12/2021   Palliative care by specialist    Bacteremia due to Escherichia coli 10/24/2021   Bacteremia due to Streptococcus 10/24/2021   Vitamin B12 deficiency 05/24/2020   Vitamin D deficiency 05/24/2020   Early dry stage nonexudative age-related macular degeneration of both eyes 12/01/2019   Pain due to onychomycosis of toenails of both feet 07/28/2019   Diabetic neuropathy ([place]) 07/28/2019   Hearing loss, right 05/22/2019   Chronic HFrEF (heart failure with reduced ejection fraction) ([place]) 05/24/2018   Atrial fibrillation ([place]) 02/08/2018   Debility 01/28/2018   Anemia    S/P CABG x 3 01/18/2018   Coronary artery disease 12/29/2017   Chronic inactive rheumatic heart disease 11/24/2017   DNR (do not resuscitate) 06/14/2017   Peripheral neuropathy 06/14/2017   Tinea cruris 04/02/2017   Health maintenance examination 12/12/2016   Obesity, Class I, BMI 30-34.9 12/12/2016   Advanced care planning/counseling discussion 10/30/2014   Benign prostatic hyperplasia 10/30/2014   CKD stage 3 due to type 2 diabetes mellitus ([place]) 07/08/2013   Tinea corporis 01/16/2013   Medicare annual wellness visit, subsequent  10/11/2011   Type 2 diabetes mellitus with other specified complication ([place])    Essential hypertension    Hyperlipidemia associated with type 2 diabetes mellitus ([place])    History of kidney stones    GERD (gastroesophageal reflux disease)     Plan   He strongly desires to proceed and have definitive clearance of his gallstones, concerned that he may have another attack.  We we will proceed with robotic cholecystectomy with ICG imaging.  This was discussed thoroughly.  Optimal plan is for robotic cholecystectomy utilizing ICG imaging.  We would hold his Eliquis for 2 days preop and continue baby aspirin through the procedure.  Risks and benefits have been discussed with the patient which include but are not limited to anesthesia, bleeding, infection, biliary ductal injury, resulting in leak or stenosis, other associated unanticipated injuries affiliated with laparoscopic surgery.   Reviewed that removing the gallbladder will only address the symptoms related to the gallbladder itself.  I believe there is the desire to proceed, accepting the risks with understanding.  Questions elicited and answered to satisfaction.    No guarantees ever expressed or implied.   Face-to-face time spent with the patient and accompanying care providers(if present) was 20 minutes, with more than 50% of the time spent counseling, educating, and coordinating care of the patient.    These notes generated with voice recognition software. I apologize for typographical errors.  Ronny Bacon M.D., FACS 05/18/2022, 2:00 PM

## 2022-05-18 NOTE — H&P (View-Only) (Signed)
Patient ID: [name] Young., male   DOB: 1941/08/10, 81 y.o.   MRN: [id number]  Chief Complaint: History of ascending cholangitis  History of Present Illness Follow-up after CT scan examination. We discussed the CT findings, differential diagnoses, and potential impact it may play on proceeding with robotic cholecystectomy.  He still desires to proceed. Follow-up after LFTs and ultrasound. There appears to be an abnormality on his ultrasound involving the dome of the right lobe of the liver, not well defined on last February's imaging.  Additional imaging requested specifically MRI.  Patient would not tolerate an MRI.  He will require sedation for CT scan. [name] Young. is a 81 y.o. male with ascending cholangitis, for which she was hospitalized last February.  Underwent ERCP February 12.  Common bile duct was dilated to 11 mm at that time, sphincterotomy of 10 mm was completed, and the stone cleared, draining bile and sludge.  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  He also reports having vegetations of his mitral valve with endocarditis for which he received antibiotics for a 6-week period.  He subsequently went through rehab.  He reports his cardiologist has cleared him to proceed with elective cholecystectomy. His EF improved with left ventricular ejection fraction of 50 to 55% last noted in May. Currently taking Eliquis, and should be bridged with aspirin through his surgery. Of note he reports that he remains weak, having relearn to walk over the last once.  He reports belching, but is currently not on any diet restrictions.  He does not check his blood sugars but he reports his hemoglobin A1c is good.  He denies any other issues regarding right upper quadrant pain or postprandial pain. No recent jaundice, hematochezia or melena, or acholic stools.  He denies any back pain.   Past Medical History Past Medical History:   Diagnosis Date   Arthritis    Ascending cholangitis 10/2021   Cataract    lens implant bilateral   Chronic kidney disease    kidney stones   Coronary artery disease    Dyspnea    Dysrhythmia    Endocarditis of mitral valve 10/2021   In setting of bacteremia from ascending colangitis   GERD (gastroesophageal reflux disease)    HFrEF (heart failure with reduced ejection fraction) ([place]) 10/2021   LVEF 40-45%   History of cholelithiasis    History of kidney stones    ca ox [name]) now [employer]   History of pneumonia    HLD (hyperlipidemia)    HTN (hypertension)    Jaundice    age 75   Paroxysmal atrial fibrillation ([place])    Pneumonia    years ago    T2DM (type 2 diabetes mellitus) ([place]) 2010      Past Surgical History:  Procedure Laterality Date   CARPAL TUNNEL RELEASE Bilateral    CATARACT EXTRACTION, BILATERAL     COLONOSCOPY  11/2012   11 adenomatous polyps, diverticulosis, rec rpt 1 yr Ardis Hughs)   COLONOSCOPY  12/2013   3 polyps, diverticulosis, rec rpt 3 yrs Ardis Hughs)   COLONOSCOPY  06/2019   6 polyps (TA), diverticulosis, f/u left open ended Ardis Hughs)   CORONARY ARTERY BYPASS GRAFT N/A 01/18/2018   Procedure: CORONARY ARTERY BYPASS GRAFTING (CABG) x 3; Using Left Internal Mammary Artery, and Right Greater Saphenous Vein harvested Endoscopically, Coronary Artery Endarterectomy;  Surgeon: Ivin Poot, MD;  Location: [facility];  Service: Open Heart Surgery;  Laterality: N/A;   ERCP N/A 10/23/2021   Procedure: ENDOSCOPIC RETROGRADE CHOLANGIOPANCREATOGRAPHY (ERCP);  Surgeon: Ladene Artist, MD;  Location: [facility] ENDOSCOPY;  Service: Endoscopy;  Laterality: N/A;   KNEE CARTILAGE SURGERY Left    LEFT HEART CATH AND CORONARY ANGIOGRAPHY N/A 12/21/2017   Procedure: LEFT HEART CATH AND CORONARY ANGIOGRAPHY;  Surgeon: Nelva Bush, MD;  Location: [facility] LAB;  Service: Cardiovascular;  Laterality: N/A;   LITHOTRIPSY     REMOVAL OF STONES  10/23/2021    Procedure: REMOVAL OF STONES;  Surgeon: Ladene Artist, MD;  Location: [facility] ENDOSCOPY;  Service: Endoscopy;;   SPHINCTEROTOMY  10/23/2021   Procedure: Joan Mayans;  Surgeon: Ladene Artist, MD;  Location: [facility];  Service: Endoscopy;;   TEE WITHOUT CARDIOVERSION N/A 01/18/2018   Procedure: TRANSESOPHAGEAL ECHOCARDIOGRAM (TEE);  Surgeon: Prescott Gum, Collier Salina, MD;  Location: [facility];  Service: Open Heart Surgery;  Laterality: N/A;   TEE WITHOUT CARDIOVERSION N/A 10/28/2021   Procedure: TRANSESOPHAGEAL ECHOCARDIOGRAM (TEE);  Surgeon: Pixie Casino, MD;  Location: [facility] ENDOSCOPY;  Service: Cardiovascular;  Laterality: N/A;   UMBILICAL HERNIA REPAIR     with mesh    No Known Allergies  Current Outpatient Medications  Medication Sig Dispense Refill   ACCU-CHEK AVIVA PLUS test strip USE AS DIRECTED TO CHECK BLOOD SUGARS UPTO FOUR TIMES DAILY. 100 each 3   blood glucose meter kit and supplies Dispense based on patient and insurance preference. Use up to four times daily as directed. (FOR ICD-10 E10.9, E11.9). 1 each 0   Cholecalciferol (VITAMIN D3) 25 MCG (1000 UT) CAPS Take 1 capsule (1,000 Units total) by mouth daily. 30 capsule    Cyanocobalamin (B-12) 1000 MCG SUBL Place 1 tablet under the tongue daily.     ELIQUIS 5 MG TABS tablet TAKE 1 TABLET BY MOUTH TWICE A DAY 60 tablet 3   ENTRESTO 24-26 MG TAKE 1 TABLET BY MOUTH TWICE A DAY 60 tablet 4   FARXIGA 5 MG TABS tablet TAKE 1 TABLET BY MOUTH EVERY DAY 30 tablet 3   fenofibrate (TRICOR) 145 MG tablet TAKE 1 TABLET BY MOUTH EVERY DAY 90 tablet 0   metFORMIN (GLUCOPHAGE) 500 MG tablet Take by mouth daily with breakfast.     metoprolol succinate (TOPROL-XL) 50 MG 24 hr tablet Take 1 tablet (50 mg total) by mouth daily. Take with or immediately following a meal. 30 tablet 5   Multiple Vitamins-Minerals (MACULAR VITAMIN BENEFIT PO) Take 1 tablet by mouth daily.     nitroGLYCERIN (NITROSTAT) 0.4 MG SL tablet Place 1 tablet (0.4 mg total) under the  tongue every 5 (five) minutes as needed for chest pain. 25 tablet 4   Omega-3 Fatty Acids (FISH OIL) 1200 MG CAPS Take 2 capsules (2,400 mg total) by mouth daily.     omeprazole (PRILOSEC) 40 MG capsule Take 1 capsule (40 mg total) by mouth every Monday, Wednesday, and Friday. 40 capsule 3   rosuvastatin (CRESTOR) 10 MG tablet TAKE 1 TABLET BY MOUTH EVERY DAY 90 tablet 0   tamsulosin (FLOMAX) 0.4 MG CAPS capsule TAKE 1 CAPSULE BY MOUTH EVERY DAY 30 capsule 6   No current facility-administered medications for this visit.    Family History Family History  Problem Relation Age of Onset   Alzheimer's disease Mother    Breast cancer Mother        breast   Atrial fibrillation Mother    CAD Mother    Stroke Father  37   Diabetes Father    CAD Father    Colon polyps Sister    Colon polyps Brother    Rectal cancer Maternal Grandfather        rectal   Colon cancer Maternal Grandfather 80   Esophageal cancer Neg Hx    Stomach cancer Neg Hx       Social History Social History   Tobacco Use   Smoking status: Never    Passive exposure: Never   Smokeless tobacco: Former    Types: Chew    Quit date: 09/08/2001  Vaping Use   Vaping Use: Never used  Substance Use Topics   Alcohol use: Not Currently    Comment: occ   Drug use: No        Review of Systems  Constitutional: Negative.   HENT:  Positive for hearing loss.   Eyes: Negative.   Respiratory: Negative.    Cardiovascular: Negative.   Gastrointestinal: Negative.   Genitourinary:  Positive for urgency.  Skin: Negative.   Neurological: Negative.   Psychiatric/Behavioral: Negative.        Physical Exam Blood pressure 129/69, pulse (!) 58, temperature 97.7 F (36.5 C), temperature source Oral, weight 243 lb 3.2 oz (110.3 kg), SpO2 97 %. Last Weight  Most recent update: 05/18/2022 10:08 AM    Weight  110.3 kg (243 lb 3.2 oz)             CONSTITUTIONAL: Well developed, and nourished, appropriately responsive and  aware without distress.   EYES: Sclera non-icteric.   EARS, NOSE, MOUTH AND THROAT:  The oropharynx is clear. Oral mucosa is pink and moist.   Hearing is intact to voice, his voice is at he reports a new baseline, and not his native voice. NECK: Trachea is midline, and there is no jugular venous distension.  LYMPH NODES:  Lymph nodes in the neck are not enlarged. RESPIRATORY:  Lungs are clear, and breath sounds are equal bilaterally. Normal respiratory effort without pathologic use of accessory muscles. CARDIOVASCULAR: Heart is regular in rate and rhythm. GI: The abdomen is  soft, nontender, and nondistended.  He has a wide epigastric diastases.  There were no palpable masses. I did not appreciate hepatosplenomegaly. There were normal bowel sounds.   SKIN: Skin turgor is normal. No pathologic skin lesions appreciated.  NEUROLOGIC:  Motor and sensation appear grossly normal.  Cranial nerves are grossly without defect. PSYCH:  Alert and oriented to person, place and time. Affect is appropriate for situation.  Data Reviewed I have personally reviewed what is currently available of the patient's imaging, recent labs and medical records.   Labs:     Latest Ref Rng & Units 04/25/2022   11:13 AM 12/07/2021    8:33 AM 11/04/2021    4:18 AM  CBC  WBC 4.0 - 10.5 K/uL 8.1  6.7  8.6   Hemoglobin 13.0 - 17.0 g/dL 11.8  10.6  8.4   Hematocrit 39.0 - 52.0 % 38.8  32.3  26.5   Platelets 150 - 400 K/uL 291  287.0  370       Latest Ref Rng & Units 05/17/2022    7:59 AM 04/25/2022   11:13 AM 12/07/2021    8:33 AM  CMP  Glucose 70 - 99 mg/dL 109  122  128   BUN 6 - 23 mg/dL _0 Creatinine 0.40 - 1.50 mg/dL 1.33  1.44  1.06   Sodium 135 - 145 mEq/L  139  140  139   Potassium 3.5 - 5.1 mEq/L 4.5  4.2  5.0   Chloride 96 - 112 mEq/L 104  110  103   CO2 19 - 32 mEq/L _0 Calcium 8.4 - 10.5 mg/dL 9.4  9.1  9.4   Total Protein 6.5 - 8.1 g/dL  7.3  6.8   Total Bilirubin 0.3 - 1.2 mg/dL  0.6   0.7   Alkaline Phos 38 - 126 U/L  36  56   AST 15 - 41 U/L  13  13   ALT 0 - 44 U/L  6  3       Imaging: CLINICAL DATA:  81 year old male with history of chest and back pain. Suspected aortic dissection.   EXAM: CT ANGIOGRAPHY CHEST, ABDOMEN AND PELVIS   TECHNIQUE: Non-contrast CT of the chest was initially obtained.   Multidetector CT imaging through the chest, abdomen and pelvis was performed using the standard protocol during bolus administration of intravenous contrast. Multiplanar reconstructed images and MIPs were obtained and reviewed to evaluate the vascular anatomy.   RADIATION DOSE REDUCTION: This exam was performed according to the departmental dose-optimization program which includes automated exposure control, adjustment of the mA and/or kV according to patient size and/or use of iterative reconstruction technique.   CONTRAST:  66m OMNIPAQUE IOHEXOL 350 MG/ML SOLN   COMPARISON:  Chest CT 01/17/2018.   FINDINGS: CTA CHEST FINDINGS   Cardiovascular: Precontrast images demonstrate no crescentic high attenuation associated with the wall of the thoracic aorta to suggest the presence of acute intramural hemorrhage. Postcontrast images demonstrate no evidence of thoracic aortic aneurysm or dissection. Heart size is normal. There is no significant pericardial fluid, thickening or pericardial calcification. There is aortic atherosclerosis, as well as atherosclerosis of the great vessels of the mediastinum and the coronary arteries, including calcified atherosclerotic plaque in the left main, left anterior descending, left circumflex and right coronary arteries. Status post median sternotomy for CABG including LIMA to the LAD. Calcifications of the aortic valve.   Mediastinum/Nodes: No pathologically enlarged mediastinal or hilar lymph nodes. Esophagus is unremarkable in appearance. No axillary lymphadenopathy.   Lungs/Pleura: No acute consolidative airspace  disease. No pleural effusions. No definite suspicious appearing pulmonary nodules or masses are noted on today's examination which is limited by considerable patient motion. Dependent areas of subsegmental atelectasis or scarring are noted in the lung bases bilaterally.   Musculoskeletal: Median sternotomy wires. There are no aggressive appearing lytic or blastic lesions noted in the visualized portions of the skeleton.   Review of the MIP images confirms the above findings.   CTA ABDOMEN AND PELVIS FINDINGS   VASCULAR   Aorta: Normal caliber aorta without aneurysm, dissection, vasculitis or significant stenosis.   Celiac: Patent without evidence of aneurysm, dissection, vasculitis or significant stenosis.   SMA: Patent without evidence of aneurysm, dissection, vasculitis or significant stenosis.   Renals: Both renal arteries are patent without evidence of aneurysm, dissection, vasculitis, fibromuscular dysplasia or significant stenosis.   IMA: Patent without evidence of aneurysm, dissection, vasculitis or significant stenosis.   Inflow: Patent without evidence of aneurysm, dissection, vasculitis or significant stenosis.   Veins: No obvious venous abnormality within the limitations of this arterial phase study.   Review of the MIP images confirms the above findings.   NON-VASCULAR   Hepatobiliary: No suspicious cystic or solid hepatic lesions are confidently identified on today's arterial phase examination. Partially calcified gallstone measuring 1.1 cm in  diameter lying dependently in the gallbladder. No findings to suggest an acute cholecystitis are noted at this time. Common bile duct measures 11 mm in the porta hepatis. No calcified stones are identified in the common bile duct. No intrahepatic biliary ductal dilatation.   Pancreas: No pancreatic mass. No pancreatic ductal dilatation. No pancreatic or peripancreatic fluid collections or inflammatory changes.    Spleen: Unremarkable.   Adrenals/Urinary Tract: 5 mm calculus at the right ureteropelvic junction (axial image 191 of series 7). No proximal hydroureteronephrosis to indicate active obstruction at this time. There is extensive perinephric stranding bilaterally (nonspecific). Low-attenuation lesions in both kidneys, compatible with simple cysts, measuring up to 4.4 cm in the lower pole of the right kidney. Urinary bladder is normal in appearance. Bilateral adrenal glands are normal in appearance.   Stomach/Bowel: Normal appearance of the stomach. No pathologic dilatation of small bowel or colon. Numerous colonic diverticulae are noted, particularly in the sigmoid colon, without definite focal surrounding inflammatory changes to clearly indicate an associated diverticulitis at this time. The appendix is not confidently identified and may be surgically absent. Regardless, there are no inflammatory changes noted adjacent to the cecum to suggest the presence of an acute appendicitis at this time.   Lymphatic: No lymphadenopathy noted in the abdomen or pelvis.   Reproductive: Prostate gland and seminal vesicles are unremarkable in appearance.   Other: No significant volume of ascites.  No pneumoperitoneum.   Musculoskeletal: There are no aggressive appearing lytic or blastic lesions noted in the visualized portions of the skeleton.   Review of the MIP images confirms the above findings.   IMPRESSION: 1. No evidence of acute aortic syndrome. 2. No acute findings in the thorax to account for the patient's symptoms. 3. 5 mm calculus at the right ureteropelvic junction. At this time, there is no proximal hydronephrosis to indicate urinary tract obstruction. 4. Cholelithiasis without evidence of acute cholecystitis. 5. Dilatation of the common bile duct. No intrahepatic biliary ductal dilatation to clearly indicate biliary tract obstruction. Additionally, there is no calcified  choledocholithiasis. This is of uncertain etiology and significance, and could be age related, however, correlation with liver function tests is recommended. If there is clinical concern for biliary tract obstruction, further evaluation with abdominal MRI with and without IV gadolinium with MRCP would be recommended. 6. Aortic atherosclerosis, in addition to left main and three-vessel coronary artery disease. Status post median sternotomy for CABG including LIMA to the LAD. 7. There are calcifications of the aortic valve. Echocardiographic correlation for evaluation of potential valvular dysfunction may be warranted if clinically indicated. 8. Additional incidental findings, as above.     Electronically Signed   By: Vinnie Langton M.D.   On: 10/23/2021 07:20  CLINICAL DATA:  Cholangitis   EXAM: ULTRASOUND ABDOMEN LIMITED RIGHT UPPER QUADRANT   COMPARISON:  CT scan October 23, 2021   FINDINGS: Gallbladder:   Cholelithiasis identified with mild wall thickening measuring 3.6 mm. No Murphy's sign or pericholecystic fluid.   Common bile duct:   Diameter: 3.7 mm   Liver:   Apparent indeterminate mass in the right hepatic dome measuring 5.1 x 4.9 x 5.4 cm. This mass may contain solid and cystic components. Portal vein is patent on color Doppler imaging with normal direction of blood flow towards the liver.   Other: None.   IMPRESSION: 1. Apparent solid and cystic mass off the right hepatic dome measuring 5.1 x 4.9 x 5.4 cm. Recommend an MRI of the abdomen with and without  contrast for better characterization. 2. Cholelithiasis and mild gallbladder wall thickening. The gallbladder is otherwise unremarkable. 3. The common bile duct is normal.   These results will be called to the ordering clinician or representative by the Radiologist Assistant, and communication documented in the PACS or [employer].     Electronically Signed   By: Dorise Bullion III M.D.    On: 04/28/2022 17:40  CLINICAL DATA:  Further evaluation of hepatic dome lesion seen on ultrasound.   EXAM: CT ABDOMEN AND PELVIS WITH CONTRAST   TECHNIQUE: Multidetector CT imaging of the abdomen and pelvis was performed using the standard protocol following bolus administration of intravenous contrast.   RADIATION DOSE REDUCTION: This exam was performed according to the departmental dose-optimization program which includes automated exposure control, adjustment of the mA and/or kV according to patient size and/or use of iterative reconstruction technique.   CONTRAST:  147m OMNIPAQUE IOHEXOL 300 MG/ML  SOLN   COMPARISON:  CT October 23, 2021 and ultrasound April 28, 2022   FINDINGS: Lower chest: Small right pleural effusion.  Median sternotomy wires.   Hepatobiliary: Hypodense lesion in the dome of the liver demonstrates an irregular, thickened enhancing wall and measures 5.9 x 4.3 cm on image 22/2 corresponding with the lesion seen on recent ultrasound. Pneumobilia reflect sequela of ampullary access and is consistent with duct patency. There is some atrophy of the lateral left lobe of the liver with some associated mild intrahepatic ductal dilation best seen on delayed postcontrast imaging for instance image 28/12. Cholelithiasis without findings of acute cholecystitis.   Pancreas: No pancreatic ductal dilation or evidence of acute inflammation.   Spleen: No splenomegaly or focal splenic lesion.   Adrenals/Urinary Tract: Bilateral adrenal glands appear normal. No hydronephrosis. Nonobstructive right renal stones measure up to 3-4 mm. Bilateral hypodense renal lesions measure fluid density and are consistent with cysts as well as additional bilateral subcentimeter hypodense renal lesions which are technically too small to accurately characterize but statistically likely reflect cysts, these are considered benign and requiring no independent imaging follow-up.  Urinary bladder is unremarkable for degree of distension.   Stomach/Bowel: Radiopaque enteric contrast material traverses the descending colon. Small hiatal hernia otherwise the stomach is unremarkable for degree of distension. No pathologic dilation of small or large bowel. No evidence of acute bowel inflammation. Left-sided colonic diverticulosis without findings of acute diverticulitis.   Vascular/Lymphatic: Aortic atherosclerosis. No pathologically enlarged abdominal or pelvic lymph nodes.   Reproductive: Prostate is unremarkable.   Other: No significant abdominopelvic free fluid.   Musculoskeletal: No acute osseous abnormality. Multilevel degenerative changes spine. Degenerative change of the bilateral hips and SI joints. Chronic osseous changes of the pubic symphysis.   IMPRESSION: 1. Thick-walled lesion in the dome of the liver is hypodense measuring close to fluid density, with imaging characteristics most consistent with an intrahepatic abscess. Suggest further evaluation with direct fluid sampling. 2. Relative atrophy of the lateral left lobe of the liver with some associated mild segmental intrahepatic ductal dilation, nonspecific possibly reflecting sequela of prior infection/inflammation. Consider assessment of stability and more definitive characterization by hepatic protocol MRI with and without contrast in 3-6 months. 3. Cholelithiasis without findings of acute cholecystitis. 4. Left-sided colonic diverticulosis without findings of acute diverticulitis. 5. Nonobstructive right renal stones. 6.  Aortic Atherosclerosis (ICD10-I70.0).   These results will be called to the ordering clinician or representative by the Radiologist Assistant, and communication documented in the PACS or [employer]     Electronically Signed  By: Dahlia Bailiff M.D.   On: 05/10/2022 15:03 Assessment    Gallstones, relatively asymptomatic however with history of ascending  cholangitis with choledocholithiasis.  Status post ERCP with sphincterotomy.  Currently with normal LFTs, and CBC. Abnormality of dome of right lobe of liver on ultrasound, CT scan consistent with intrahepatic abscess, however this is inconsistent with his clinical presentation at present.  Uncertain the degree of added difficulty this may create for proceeding with cholecystectomy.  I am uncertain that it will even be appreciated during the cholecystectomy.  If accessible it may be sampled or aspirated.   Patient Active Problem List   Diagnosis Date Noted   Pre-op evaluation 05/03/2022   Liver mass, right lobe 05/03/2022   CCC (chronic calculous cholecystitis) 04/25/2022   History of acute cholangitis 04/25/2022   Hoarseness 12/22/2021   Infective endocarditis of mitral valve 11/12/2021   Palliative care by specialist    Bacteremia due to Escherichia coli 10/24/2021   Bacteremia due to Streptococcus 10/24/2021   Vitamin B12 deficiency 05/24/2020   Vitamin D deficiency 05/24/2020   Early dry stage nonexudative age-related macular degeneration of both eyes 12/01/2019   Pain due to onychomycosis of toenails of both feet 07/28/2019   Diabetic neuropathy ([place]) 07/28/2019   Hearing loss, right 05/22/2019   Chronic HFrEF (heart failure with reduced ejection fraction) ([place]) 05/24/2018   Atrial fibrillation ([place]) 02/08/2018   Debility 01/28/2018   Anemia    S/P CABG x 3 01/18/2018   Coronary artery disease 12/29/2017   Chronic inactive rheumatic heart disease 11/24/2017   DNR (do not resuscitate) 06/14/2017   Peripheral neuropathy 06/14/2017   Tinea cruris 04/02/2017   Health maintenance examination 12/12/2016   Obesity, Class I, BMI 30-34.9 12/12/2016   Advanced care planning/counseling discussion 10/30/2014   Benign prostatic hyperplasia 10/30/2014   CKD stage 3 due to type 2 diabetes mellitus ([place]) 07/08/2013   Tinea corporis 01/16/2013   Medicare annual wellness visit, subsequent  10/11/2011   Type 2 diabetes mellitus with other specified complication ([place])    Essential hypertension    Hyperlipidemia associated with type 2 diabetes mellitus ([place])    History of kidney stones    GERD (gastroesophageal reflux disease)     Plan   He strongly desires to proceed and have definitive clearance of his gallstones, concerned that he may have another attack.  We we will proceed with robotic cholecystectomy with ICG imaging.  This was discussed thoroughly.  Optimal plan is for robotic cholecystectomy utilizing ICG imaging.  We would hold his Eliquis for 2 days preop and continue baby aspirin through the procedure.  Risks and benefits have been discussed with the patient which include but are not limited to anesthesia, bleeding, infection, biliary ductal injury, resulting in leak or stenosis, other associated unanticipated injuries affiliated with laparoscopic surgery.   Reviewed that removing the gallbladder will only address the symptoms related to the gallbladder itself.  I believe there is the desire to proceed, accepting the risks with understanding.  Questions elicited and answered to satisfaction.    No guarantees ever expressed or implied.   Face-to-face time spent with the patient and accompanying care providers(if present) was 20 minutes, with more than 50% of the time spent counseling, educating, and coordinating care of the patient.    These notes generated with voice recognition software. I apologize for typographical errors.  Ronny Bacon M.D., FACS 05/18/2022, 2:00 PM

## 2022-05-18 NOTE — Patient Instructions (Addendum)
STOP taking Eliquis 2 days prior to surgery and keep taking the Aspirin.  Our surgery scheduler Pamala Hurry will call you within 24-48 hours to get you scheduled. If you have not heard from her after 48 hours, please call our office. Have the blue sheet available when she calls to write down important information.  If you have any concerns or questions, please feel free to call our office.   Minimally Invasive Cholecystectomy Minimally invasive cholecystectomy is surgery to remove the gallbladder. The gallbladder is a pear-shaped organ that lies beneath the liver on the right side of the body. The gallbladder stores bile, which is a fluid that helps the body digest fats. Cholecystectomy is often done to treat inflammation (irritation and swelling) of the gallbladder (cholecystitis). This condition is usually caused by a buildup of gallstones (cholelithiasis) in the gallbladder or when the fluid in the gall bladder becomes stagnant because gallstones get stuck in the ducts (tubes) and block the flow of bile. This can result in inflammation and pain. In severe cases, emergency surgery may be required. This procedure is done through small incisions in the abdomen, instead of one large incision. It is also called laparoscopic surgery. A thin scope with a camera (laparoscope) is inserted through one incision. Then surgical instruments are inserted through the other incisions. In some cases, a minimally invasive surgery may need to be changed to a surgery that is done through a larger incision. This is called open surgery. Tell a health care provider about: Any allergies you have. All medicines you are taking, including vitamins, herbs, eye drops, creams, and over-the-counter medicines. Any problems you or family members have had with anesthetic medicines. Any bleeding problems you have. Any surgeries you have had. Any medical conditions you have. Whether you are pregnant or may be pregnant. What are the  risks? Generally, this is a safe procedure. However, problems may occur, including: Infection. Bleeding. Allergic reactions to medicines. Damage to nearby structures or organs. A gallstone remaining in the common bile duct. The common bile duct carries bile from the gallbladder to the small intestine. A bile leak from the liver or cystic duct after your gallbladder is removed. What happens before the procedure? When to stop eating and drinking Follow instructions from your health care provider about what you may eat and drink before your procedure. These may include: 8 hours before the procedure Stop eating most foods. Do not eat meat, fried foods, or fatty foods. Eat only light foods, such as toast or crackers. All liquids are okay except energy drinks and alcohol. 6 hours before the procedure Stop eating. Drink only clear liquids, such as water, clear fruit juice, black coffee, plain tea, and sports drinks. Do not drink energy drinks or alcohol. 2 hours before the procedure Stop drinking all liquids. You may be allowed to take medicines with small sips of water. If you do not follow your health care provider's instructions, your procedure may be delayed or canceled. Medicines Ask your health care provider about: Changing or stopping your regular medicines. This is especially important if you are taking diabetes medicines or blood thinners. Taking medicines such as aspirin and ibuprofen. These medicines can thin your blood. Do not take these medicines unless your health care provider tells you to take them. Taking over-the-counter medicines, vitamins, herbs, and supplements. General instructions If you will be going home right after the procedure, plan to have a responsible adult: Take you home from the hospital or clinic. You will not be  allowed to drive. Care for you for the time you are told. Do not use any products that contain nicotine or tobacco for at least 4 weeks before the  procedure. These products include cigarettes, chewing tobacco, and vaping devices, such as e-cigarettes. If you need help quitting, ask your health care provider. Ask your health care provider: How your surgery site will be marked. What steps will be taken to help prevent infection. These may include: Removing hair at the surgery site. Washing skin with a germ-killing soap. Taking antibiotic medicine. What happens during the procedure?  An IV will be inserted into one of your veins. You will be given one or both of the following: A medicine to help you relax (sedative). A medicine to make you fall asleep (general anesthetic). Your surgeon will make several small incisions in your abdomen. The laparoscope will be inserted through one of the small incisions. The camera on the laparoscope will send images to a monitor in the operating room. This lets your surgeon see inside your abdomen. A gas will be pumped into your abdomen. This will expand your abdomen to give the surgeon more room to perform the surgery. Other tools that are needed for the procedure will be inserted through the other incisions. The gallbladder will be removed through one of the incisions. Your common bile duct may be examined. If stones are found in the common bile duct, they may be removed. After your gallbladder has been removed, the incisions will be closed with stitches (sutures), staples, or skin glue. Your incisions will be covered with a bandage (dressing). The procedure may vary among health care providers and hospitals. What happens after the procedure? Your blood pressure, heart rate, breathing rate, and blood oxygen level will be monitored until you leave the hospital or clinic. You will be given medicines as needed to control your pain. You may have a drain placed in the incision. The drain will be removed a day or two after the procedure. Summary Minimally invasive cholecystectomy, also called laparoscopic  cholecystectomy, is surgery to remove the gallbladder using small incisions. Tell your health care provider about all the medical conditions you have and all the medicines you are taking for those conditions. Before the procedure, follow instructions about when to stop eating and drinking and changing or stopping medicines. Plan to have a responsible adult care for you for the time you are told after you leave the hospital or clinic. This information is not intended to replace advice given to you by your health care provider. Make sure you discuss any questions you have with your health care provider. Document Revised: 03/01/2021 Document Reviewed: 03/01/2021 [employer] Patient Education  [employer].

## 2022-05-19 ENCOUNTER — Telehealth: Payer: Self-pay | Admitting: Surgery

## 2022-05-19 NOTE — Telephone Encounter (Signed)
Patient and wife have been advised of Pre-Admission date/time, and Surgery date at [facility].  Surgery Date: 05/31/22 Preadmission Testing Date: 05/24/22 (phone 8a-1p)  Patient has been made aware to call [phone], between 1-3:00pm the day before surgery, to find out what time to arrive for surgery.

## 2022-05-24 ENCOUNTER — Other Ambulatory Visit: Payer: Self-pay

## 2022-05-24 ENCOUNTER — Encounter
Admission: RE | Admit: 2022-05-24 | Discharge: 2022-05-24 | Disposition: A | Discharge status: Home / Self Care | Payer: Medicare PPO | Source: Ambulatory Visit | Attending: Surgery | Admitting: Surgery

## 2022-05-24 VITALS — Ht 71.0 in | Wt 240.0 lb

## 2022-05-24 DIAGNOSIS — Z01812 Encounter for preprocedural laboratory examination: Secondary | ICD-10-CM

## 2022-05-24 DIAGNOSIS — E1169 Type 2 diabetes mellitus with other specified complication: Secondary | ICD-10-CM

## 2022-05-24 NOTE — Patient Instructions (Addendum)
Your procedure is scheduled on: May 31, 2022 [facility] Report to the Registration Desk on the 1st floor of the [place]. To find out your arrival time, please call [phone] between 1PM - 3PM on: May 30, 2022 TUESDAY If your arrival time is 6:00 am, do not arrive prior to that time as the [place] entrance doors do not open until 6:00 am.  REMEMBER: Instructions that are not followed completely may result in serious medical risk, up to and including death; or upon the discretion of your surgeon and anesthesiologist your surgery may need to be rescheduled.  DO NOT EAT OR DRINK ANYTHING after midnight the night before surgery.  No gum chewing, lozengers or hard candies.  TAKE THESE MEDICATIONS THE MORNING OF SURGERY WITH A SIP OF WATER: METOPROLOL ASPIRIN TAMSULOSIN TRICOR ROSUVASTATIN OMEPRAZOLE (take one the night before and one on the morning of surgery - helps to prevent nausea after surgery.)  LAST DOSE OF FARXIGA IS May 27, 2022 SATURDAY.  LAST DOSE OF METFORMIN IS May 28, 2022 SUNDAY.  LAST DOSE OF ELIQUIS IS May 28, 2022 SUNDAY. TAKE ASPIRIN EVERYDAY UNTIL YOU RESTART ELIQUIS.  One week prior to surgery: Stop Anti-inflammatories (NSAIDS) such as Advil, Aleve, Ibuprofen, Motrin, Naproxen, Naprosyn and Aspirin based products such as Excedrin, Goodys Powder, BC Powder. Stop ANY OVER THE COUNTER supplements until after surgery. You may however, continue to take Tylenol if needed for pain up until the day of surgery.  No Alcohol for 24 hours before or after surgery.  No Smoking including e-cigarettes for 24 hours prior to surgery.  No chewable tobacco products for at least 6 hours prior to surgery.  No nicotine patches on the day of surgery.  Do not use any "recreational" drugs for at least a week prior to your surgery.  Please be advised that the combination of cocaine and anesthesia may have negative outcomes, up to and including  death. If you test positive for cocaine, your surgery will be cancelled.  On the morning of surgery brush your teeth with toothpaste and water, you may rinse your mouth with mouthwash if you wish. Do not swallow any toothpaste or mouthwash.  Use CHG Soap as directed on instruction sheet.  Do not wear jewelry, make-up, hairpins, clips or nail polish.  Do not wear lotions, powders, or perfumes.   Do not shave body from the neck down 48 hours prior to surgery just in case you cut yourself which could leave a site for infection.  Also, freshly shaved skin may become irritated if using the CHG soap.  Contact lenses, hearing aids and dentures may not be worn into surgery.  Do not bring valuables to the hospital. [facility] is not responsible for any missing/lost belongings or valuables.   Notify your doctor if there is any change in your medical condition (cold, fever, infection).  Wear comfortable clothing (specific to your surgery type) to the hospital.  After surgery, you can help prevent lung complications by doing breathing exercises.  Take deep breaths and cough every 1-2 hours. Your doctor may order a device called an Incentive Spirometer to help you take deep breaths. When coughing or sneezing, hold a pillow firmly against your incision with both hands. This is called "splinting." Doing this helps protect your incision. It also decreases belly discomfort.  If you are being discharged the day of surgery, you will not be allowed to drive home. You will need a responsible adult (18 years or older) to drive  you home and stay with you that night.   If you are taking public transportation, you will need to have a responsible adult (18 years or older) with you. Please confirm with your physician that it is acceptable to use public transportation.   Please call the [place] Dept. at [phone] if you have any questions about these instructions.  Surgery Visitation  Policy:  Patients undergoing a surgery or procedure may have two family members or support persons with them as long as the person is not COVID-19 positive or experiencing its symptoms.

## 2022-05-25 ENCOUNTER — Encounter: Payer: Self-pay | Admitting: Surgery

## 2022-05-25 NOTE — Progress Notes (Signed)
Perioperative Services  Pre-Admission/Anesthesia Testing Clinical Review  Date: 05/29/22  Patient Demographics:  Name: [name] Young. DOB:   [birth year] MRN:   [id number]  Planned Surgical Procedure(s):    Case: [id number] Date/Time: 05/31/22 0911   Procedures:      XI ROBOTIC ASSISTED LAPAROSCOPIC CHOLECYSTECTOMY     INDOCYANINE GREEN FLUORESCENCE IMAGING (ICG)   Anesthesia type: General   Pre-op diagnosis: Chronic calculous cholecystitis K80.10   Location: [facility] OR ROOM 04 / [place] [facility]   Surgeons: Ronny Bacon, MD   NOTE: Available PAT nursing documentation and vital signs have been reviewed. Clinical nursing staff has updated patient's PMH/PSHx, current medication list, and drug allergies/intolerances to ensure comprehensive history available to assist in medical decision making as it pertains to the aforementioned surgical procedure and anticipated anesthetic course. Extensive review of available clinical information performed. [facility] PM[facility] and PSHx updated with any diagnoses/procedures that  may have been inadvertently omitted during his intake with the pre-admission testing department's nursing staff.  Clinical Discussion:  [name] Young. is a 81 y.o. male who is submitted for pre-surgical anesthesia review and clearance prior to him undergoing the above procedure. Patient has never been a smoker. Pertinent PMH includes: CAD, cardiomyopathy, HFrEF, paroxysmal atrial fibrillation, ascending aortic dilatation, aortic atherosclerosis, endocarditis of mitral valve, RIGHT carotid artery disease, HTN, HLD, T2DM, CKD-III, GERD (on daily PPI), dyspnea, chronic calculus cholecystitis, BPH, OA.  Patient is followed by cardiology (End, MD). He was last seen in the cardiology clinic on 03/29/2022; notes reviewed.  At the time of his clinic visit, patient reporting significant fatigue, RIGHT lower extremity weakness, and exertional dyspnea.  He denied any  episodes of chest pain, PND, orthopnea, palpitations, or presyncope/syncope.  He has had intermittent episodes of peripheral edema and vertiginous symptoms.  Patient with a past medical history significant for cardiovascular diagnoses.  Myocardial perfusion imaging study was performed on 12/12/2017 revealing a low normal left ventricular systolic function with an EF of 51%.  There was inferolateral hypokinesis noted.  Additionally, there was a medium defect of moderate severity present in the basal inferolateral and mid inferolateral location consistent with ischemia.  There were no ST segment changes noted during stress, however frequent PVCs were noted.  Study was determined to be abnormal and intermediate risk.  Patient underwent diagnostic LEFT heart catheterization on 12/21/2017 revealing multivessel CAD; 75% mid LAD, 90% D1, 80-99% OM1/2, and complete occlusion of the proximal RCA with left-to-right collaterals.  Patient was referred to CVTS for consideration of revascularization procedure.  Patient underwent a three-vessel CABG procedure on 01/18/2018.  LIMA-LAD, SVG-diagonal, and SVG--PLB (RCA) bypass grafts were placed.  Patient developed mitral valve endocarditis in the setting of polymicrobial bacteremia associated with ascending cholangitis in 10/2021.  TEE at that time revealed a mildly reduced left ventricular systolic function with an EF of 40-45%.  There was left ventricular global hypokinesis.  No LA/LAA thrombus was detected.  Increased echogenicity of the anterior mitral valve leaflet with small mobile vegetation noted consistent with endocarditis.  There was mild mitral valve regurgitation.  Small vegetation also noted on the mitral valve.  Patient was treated with 6 weeks of intravenous antibiotics.  Most recent TTE was performed on 01/11/2022 revealing a low normal left ventricular systolic function with an EF of 50-55%.  There was mild LVH. Left ventricular diastolic Doppler  parameters consistent with abnormal relaxation (G1DD).  Right ventricle mildly enlarged.  There were calcifications noted on the mitral valve leaflet  tips consistent with prior endocarditis.  Mitral valve is degenerative with mild regurgitation.  There was mild aortic valve sclerosis/calcification noted there was no evidence of a significant transvalvular gradient to suggest stenosis.  Aortic dilatation noted; aortic root 40 mm and ascending aorta 39 mm.  Patient with an atrial fibrillation diagnosis; CHA2DS2-VASc Score = 6 (age x2, HFrEF, HTN, vascular disease history, T2DM). His rate and rhythm are currently being maintained on oral metoprolol tartrate. He is chronically anticoagulated using apixaban; reported to be compliant with therapy with no evidence or reports of GI bleeding.  Blood pressure reason why controlled a 138/62 mm/Hg on currently prescribed ARB/ANRI Delene Loll) and beta-blocker (metoprolol succinate) therapies. He is on a rosuvastatin + fenofibrate + omega-3 fatty acid for his HLD diagnosis and further ASCVD prevention. T2DM well controlled on currently prescribed regimen; last HgbA1c was 6.1% when checked on 03/24/2022.  Additionally, patient is on an SGLT2i (dapagliflozin) in the setting of known T2DM and HFrEF.  Concerns addressed regarding patient with RIGHT-sided weakness.  Patient with questionable RIGHT vocal cord paralysis.  Discussed possibility of CVA in the setting of septic shock and endocarditis.  Cardiology wish to work-up further via MRI imaging of the brain, however patient declined due to claustrophobia.  ENT referral was recommended/offered, however patient declined. While patient has improved following admission for sepsis/endocarditis, patient does not feel back to baseline.  Given weakness, shortness of breath, and other multiple medical comorbidities, patient is not felt to currently be able to achieve 4 METS of activity without experiencing any angina/anginal equivalent  symptoms.  No changes were made to his medication regimen.  Patient follow-up with outpatient cardiology in 4 months or sooner if needed.  [name] Young. is scheduled for a XI ROBOTIC ASSISTED LAPAROSCOPIC CHOLECYSTECTOMY on 05/31/2022 with Dr. Ronny Bacon, MD. Given patient's past medical history significant for cardiovascular diagnoses and multiple medical comorbidities, presurgical clearance was sought from patient's primary care provider and cardiologist.  Specialty clearances were obtained as follows:  Per internal medicine Danise Mina, MD),  "this patient is optimized for surgery and may proceed at an overall MODERATE risk of significant perioperative complications.  Follow blood thinner bridging recommendations from cardiology".  Per cardiology (End, MD), "Mr. Platts is eager to move forward with cholecystectomy.  He appears compensated from a heart standpoint.  I am also encouraged by his improved LVEF on echo in May.  I think it is reasonable for him to proceed with cholecystectomy, if felt appropriate by surgery and Dr. Danise Mina.  Additional cardiac testing/intervention at this point would not further mitigate the perioperative risk associated with this intermediate risk procedure. Apixaban can be held for 2 days before the procedure and restarted when it is felt safe to do so from a postoperative standpoint.  I would recommend administration of aspirin 81 mg daily while Mr. Southwood is off apixaban given his history of CAD and CABG".  Patient denies previous perioperative complications with anesthesia in the past. In review of the available records, it is noted that patient underwent a MAC anesthetic course at [facility] ([facility]) in 10/2021 without documented complications.      05/24/2022    8:16 AM 05/18/2022   10:07 AM 05/02/2022    2:42 PM  Vitals with BMI  Height _0   _1   Weight 240 lbs 243 lbs 3 oz 247 lbs 2 oz  BMI 33.49 37.62 83.15  Systolic  176 160   Diastolic  69 62  Pulse  58  72    Providers/Specialists:   NOTE: Primary physician provider listed below. Patient may have been seen by APP or partner within same practice.   PROVIDER ROLE / SPECIALTY LAST Katy Apo, MD General Surgery (Surgeon) 05/18/2022  Ria Bush, MD Primary Care Provider 05/02/2022  Nelva Bush, MD Cardiology 03/29/2022   Allergies:  Patient has no known allergies.  Current Home Medications:   No current facility-administered medications for this encounter.    ACCU-CHEK AVIVA PLUS test strip   blood glucose meter kit and supplies   Cholecalciferol (VITAMIN D3) 25 MCG (1000 UT) CAPS   Cyanocobalamin (B-12) 1000 MCG SUBL   ELIQUIS 5 MG TABS tablet   ENTRESTO 24-26 MG   FARXIGA 5 MG TABS tablet   fenofibrate (TRICOR) 145 MG tablet   metFORMIN (GLUCOPHAGE) 500 MG tablet   metoprolol succinate (TOPROL-XL) 50 MG 24 hr tablet   Multiple Vitamins-Minerals (MACULAR VITAMIN BENEFIT PO)   nitroGLYCERIN (NITROSTAT) 0.4 MG SL tablet   Omega-3 Fatty Acids (FISH OIL) 1200 MG CAPS   omeprazole (PRILOSEC) 40 MG capsule   rosuvastatin (CRESTOR) 10 MG tablet   tamsulosin (FLOMAX) 0.4 MG CAPS capsule   History:   Past Medical History:  Diagnosis Date   Adenomatous colon polyp    Aortic atherosclerosis (HCC)    Arthritis    Ascending aorta dilatation (HCC)    a.) TTE 11/23/2017: asc Ao measured 37 mm; b.) TTE 10/23/2021: Ao root measured 41 mm, asc Ao measured 38 mm; c.) TEE 10/28/2021: asc Ao measured 38 mm; d.) TTE 01/11/2022: Ao root 40 mm, asc Ao 39 mm   Ascending cholangitis 10/2021   BPH (benign prostatic hyperplasia)    Cardiomyopathy (HCC)    CKD (chronic kidney disease), stage III (HCC)    Claustrophobia    Coronary artery disease    a.) LHC 12/21/2017: 75% mLAD, 90% D1, 80-99% OM1/2, CTO pRCA (L-R collaterals) --> CVTS consult. 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   In  setting of bacteremia from ascending colangitis   GERD (gastroesophageal reflux disease)    HFrEF (heart failure with reduced ejection fraction) ([place])    a.) TTE 11/23/2017: EF 50-55%, mod MAC, triv TR, G1DD; b.) TTE 10/23/2021: EF 40-45%, mild LAE, Ao sclerosis, triv MR, G2DD; c.) TEE 10/28/2021: EF 40-45%, glob HK, mobile vegitation on MV; d.) TTE 01/11/2022: EF 50-55%, mild LVH, RVE, Ao sclerosis, mild MR/AR, G1DD   History of cholelithiasis    History of kidney stones    ca ox [name] @ [facility]) now [employer]   History of pneumonia    HLD (hyperlipidemia)    HTN (hypertension)    Jaundice    age 16   Long term current use of anticoagulant    a.) apixaban   Paroxysmal atrial fibrillation ([place])    a.) CHA2DS2VASc = 6 (age x 2, HFrEF, HTN, vascular disease history, T2DM);  b.) rate/rhythm maintained on oral metoprolol succinate; chronically anticoagulated with apixaban   Pneumonia    Right-sided carotid artery disease ([place])    a.) carotid doppler 04/05/2022: 1-39% RICA   S/P CABG x 3    a.) LIMA-LAD, SVG-diagonal, SVG-PL branch of RCA   S/P cataract extraction and insertion of intraocular lens    T2DM (type 2 diabetes mellitus) ([place]) 2010   Past Surgical History:  Procedure Laterality Date   CARPAL TUNNEL RELEASE Bilateral    CATARACT EXTRACTION, BILATERAL  COLONOSCOPY  11/2012   12 adenomatous polyps, diverticulosis, rec rpt 1 yr Ardis Hughs)   COLONOSCOPY  12/2013   3 polyps, diverticulosis, rec rpt 3 yrs Ardis Hughs)   COLONOSCOPY  06/2019   6 polyps (TA), diverticulosis, f/u left open ended Ardis Hughs)   CORONARY ARTERY BYPASS GRAFT N/A 01/18/2018   Procedure: CORONARY ARTERY BYPASS GRAFTING (CABG) x 3; Using Left Internal Mammary Artery, and Right Greater Saphenous Vein harvested Endoscopically, Coronary Artery Endarterectomy;  Surgeon: Ivin Poot, MD;  Location: [place];  Service: Open Heart Surgery;  Laterality: N/A;   ERCP N/A 10/23/2021   Procedure: ENDOSCOPIC RETROGRADE  CHOLANGIOPANCREATOGRAPHY (ERCP);  Surgeon: Ladene Artist, MD;  Location: [facility] ENDOSCOPY;  Service: Endoscopy;  Laterality: N/A;   KNEE CARTILAGE SURGERY Left    LEFT HEART CATH AND CORONARY ANGIOGRAPHY N/A 12/21/2017   Procedure: LEFT HEART CATH AND CORONARY ANGIOGRAPHY;  Surgeon: Nelva Bush, MD;  Location: [place] CV LAB;  Service: Cardiovascular;  Laterality: N/A;   LITHOTRIPSY     REMOVAL OF STONES  10/23/2021   Procedure: REMOVAL OF STONES;  Surgeon: Ladene Artist, MD;  Location: [facility] ENDOSCOPY;  Service: Endoscopy;;   SPHINCTEROTOMY  10/23/2021   Procedure: Joan Mayans;  Surgeon: Ladene Artist, MD;  Location: [place];  Service: Endoscopy;;   TEE WITHOUT CARDIOVERSION N/A 01/18/2018   Procedure: TRANSESOPHAGEAL ECHOCARDIOGRAM (TEE);  Surgeon: Prescott Gum, Collier Salina, MD;  Location: [place];  Service: Open Heart Surgery;  Laterality: N/A;   TEE WITHOUT CARDIOVERSION N/A 10/28/2021   Procedure: TRANSESOPHAGEAL ECHOCARDIOGRAM (TEE);  Surgeon: Pixie Casino, MD;  Location[facility] ENDOSCOPY;  Service: Cardiovascular;  Laterality: N/A;   UMBILICAL HERNIA REPAIR     with mesh   Family History  Problem Relation Age of Onset   Alzheimer's disease Mother    Breast cancer Mother        breast   Atrial fibrillation Mother    CAD Mother    Stroke Father 77   Diabetes Father    CAD Father    Colon polyps Sister    Colon polyps Brother    Rectal cancer Maternal Grandfather        rectal   Colon cancer Maternal Grandfather 88   Esophageal cancer Neg Hx    Stomach cancer Neg Hx    Social History   Tobacco Use   Smoking status: Never    Passive exposure: Never   Smokeless tobacco: Former    Types: Chew    Quit date: 09/08/2001  Vaping Use   Vaping Use: Never used  Substance Use Topics   Alcohol use: Not Currently    Comment: occ   Drug use: No    Pertinent Clinical Results:  LABS: Labs reviewed: Acceptable for surgery.  Lab Results  Component Value Date   WBC 8.1  04/25/2022   HGB 11.8 (L) 04/25/2022   HCT 38.8 (L) 04/25/2022   MCV 90.0 04/25/2022   PLT 291 04/25/2022   Lab Results  Component Value Date   NA 139 05/17/2022   K 4.5 05/17/2022   CO2 25 05/17/2022   GLUCOSE 109 (H) 05/17/2022   BUN 23 05/17/2022   CREATININE 1.33 05/17/2022   CALCIUM 9.4 05/17/2022   GFRNONAA 49 (L) 04/25/2022    ECG: Date: 03/29/2022 Time ECG obtained: 1031 AM Rate: 60 bpm Rhythm:  Sinus rhythm with PACs Axis (leads I and aVF): Normal Intervals: PR 172 ms. QRS 86 ms. QTc 404 ms. ST segment and T wave changes: Nonspecific ST  abnormality Comparison: PACs new since previous tracing obtained on 12/21/2021; otherwise no change   IMAGING / PROCEDURES: CT ABDOMEN PELVIS W CONTRAST performed on 05/10/2022 Thick-walled lesion in the dome of the liver is hypodense measuring close to fluid density, with imaging characteristics most consistent with an intrahepatic abscess. Suggest further evaluation with direct fluid sampling. Relative atrophy of the lateral left lobe of the liver with some associated mild segmental intrahepatic ductal dilation, nonspecific possibly reflecting sequela of prior infection/inflammation. Consider assessment of stability and more definitive characterization by hepatic protocol MRI with and without contrast in 3-6 months. Cholelithiasis without findings of acute cholecystitis. Left-sided colonic diverticulosis without findings of acute diverticulitis. Nonobstructive right renal stones. Aortic atherosclerosis  DIAGNOSTIC RADIOGRAPHS OF CHEST 2 VIEWS performed on 05/02/2022 Mild cardiomegaly with old CABG Stable mediastinum with mild aortic atherosclerosis Lungs are clear Multilevel spinal bridging enthesopathy Slight thoracic kyphodextroscoliosis Interval resolution of pleural effusions and interstitial edema No active cardiopulmonary disease  US ABDOMEN LIMITED RUQ (LIVER/GB) performed on 04/28/2022 Apparent solid and cystic mass  off the right hepatic dome measuring 5.1 x 4.9 x 5.4 cm. Recommend an MRI of the abdomen with and without contrast for better characterization. Cholelithiasis and mild gallbladder wall thickening. The gallbladder is otherwise unremarkable. The common bile duct is normal.  VAS US CAROTID performed on 04/05/2022 Right Carotid: Velocities in the right ICA are consistent with a 1-39% stenosis.  Left Carotid: There was no evidence of thrombus, dissection, atherosclerotic plaque or stenosis in the cervical carotid system.  Vertebrals:  Bilateral vertebral arteries demonstrate antegrade flow. Subclavians: Normal flow hemodynamics were seen in bilateral subclavian arteries.   TRANSTHORACIC ECHOCARDIOGRAM performed on 01/11/2022 Normal left ventricular systolic function with an EF of 65-68% Mild LVH Diastolic Doppler parameters consistent with abnormal relaxation (G1DD). Right ventricle mildly enlarged Calcification/mass noted on both mitral valve leaflet tips consistent with prior endocarditis Degenerative mitral valve with mild regurgitation Mild aortic valve regurgitation Aortic root mildly dilated at 40 mm Ascending aorta mildly dilated 39 mm Normal transvalvular gradients; no valvular stenosis No pericardial effusion  CORONARY ARTERY BYPASS GRAFTING performed on 01/18/2018 Three-vessel CABG procedure LIMA-LAD SVG-diagonal SVG-posterolateral branch of the RCA  LEFT HEART CATHETERIZATION AND CORONARY ANGIOGRAPHY performed on 12/21/2017 Significant three-vessel CAD 75% mid LAD 90% D1 80-99% OM1/2 CTO of the proximal RCA with left-to-right collaterals Mildly elevated LVEDP = 15-20 mmHg Frequent PVCs noted during procedure Recommendations: refer to CVTS for consideration of revascularization procedure   MYOCARDIAL PERFUSION IMAGING STUDY (LEXISCAN) performed on 12/12/2017 Mildly reduced left ventricular systolic function with an EF of 45-54% Inferolateral hypokinesis No ST segment  deviation noted during stress.  Frequent PVCs Medium defect of moderate severity present in the basal inferolateral and mid inferolateral location consistent with ischemia Study determined to be abnormal and intermediate risk  Impression and Plan:  [name] Young. has been referred for pre-anesthesia review and clearance prior to him undergoing the planned anesthetic and procedural courses. Available labs, pertinent testing, and imaging results were personally reviewed by me. This patient has been appropriately cleared by cardiology (ACCEPTABLE) and by internal medicine (MODERATE) with the individually indicated risk of significant perioperative complications.  Based on clinical review performed today (05/29/22), barring any significant acute changes in the patient's overall condition, it is anticipated that he will be able to proceed with the planned surgical intervention. Any acute changes in clinical condition may necessitate his procedure being postponed and/or cancelled. Patient will meet with anesthesia team (MD and/or CRNA) on the day  of his procedure for preoperative evaluation/assessment. Questions regarding anesthetic course will be fielded at that time.   Pre-surgical instructions were reviewed with the patient during his PAT appointment and questions were fielded by PAT clinical staff. Patient was advised that if any questions or concerns arise prior to his procedure then he should return a call to PAT and/or his surgeon's office to discuss.  Honor Loh, MSN, APRN, FNP-C, CEN [facility]  Peri-operative Services Nurse Practitioner Phone: [phone] Fax: [phone] 05/29/22 12:07 PM  NOTE: This note has been prepared using Dragon dictation software. Despite my best ability to proofread, there is always the potential that unintentional transcriptional errors may still occur from this process.

## 2022-05-29 ENCOUNTER — Encounter: Payer: Self-pay | Admitting: Surgery

## 2022-05-31 ENCOUNTER — Ambulatory Visit: Payer: Medicare PPO | Admitting: Urgent Care

## 2022-05-31 ENCOUNTER — Ambulatory Visit
Admission: RE | Admit: 2022-05-31 | Discharge: 2022-05-31 | Disposition: A | Discharge status: Home / Self Care | Payer: Medicare PPO | Attending: Surgery | Admitting: Surgery

## 2022-05-31 ENCOUNTER — Encounter
Admission: RE | Disposition: A | Discharge status: Home / Self Care | Payer: Self-pay | Source: Home / Self Care | Attending: Surgery

## 2022-05-31 ENCOUNTER — Encounter: Payer: Self-pay | Admitting: Surgery

## 2022-05-31 ENCOUNTER — Other Ambulatory Visit: Payer: Self-pay

## 2022-05-31 DIAGNOSIS — E1169 Type 2 diabetes mellitus with other specified complication: Secondary | ICD-10-CM

## 2022-05-31 DIAGNOSIS — E785 Hyperlipidemia, unspecified: Secondary | ICD-10-CM | POA: Insufficient documentation

## 2022-05-31 DIAGNOSIS — K219 Gastro-esophageal reflux disease without esophagitis: Secondary | ICD-10-CM | POA: Insufficient documentation

## 2022-05-31 DIAGNOSIS — N189 Chronic kidney disease, unspecified: Secondary | ICD-10-CM | POA: Diagnosis not present

## 2022-05-31 DIAGNOSIS — Z87442 Personal history of urinary calculi: Secondary | ICD-10-CM | POA: Insufficient documentation

## 2022-05-31 DIAGNOSIS — Z01812 Encounter for preprocedural laboratory examination: Secondary | ICD-10-CM

## 2022-05-31 DIAGNOSIS — K819 Cholecystitis, unspecified: Secondary | ICD-10-CM | POA: Diagnosis not present

## 2022-05-31 DIAGNOSIS — I2510 Atherosclerotic heart disease of native coronary artery without angina pectoris: Secondary | ICD-10-CM | POA: Diagnosis not present

## 2022-05-31 DIAGNOSIS — I129 Hypertensive chronic kidney disease with stage 1 through stage 4 chronic kidney disease, or unspecified chronic kidney disease: Secondary | ICD-10-CM | POA: Diagnosis not present

## 2022-05-31 DIAGNOSIS — I480 Paroxysmal atrial fibrillation: Secondary | ICD-10-CM | POA: Diagnosis not present

## 2022-05-31 DIAGNOSIS — E1122 Type 2 diabetes mellitus with diabetic chronic kidney disease: Secondary | ICD-10-CM | POA: Diagnosis not present

## 2022-05-31 DIAGNOSIS — K8010 Calculus of gallbladder with chronic cholecystitis without obstruction: Secondary | ICD-10-CM | POA: Diagnosis not present

## 2022-05-31 DIAGNOSIS — I700 Atherosclerosis of aorta: Secondary | ICD-10-CM | POA: Insufficient documentation

## 2022-05-31 DIAGNOSIS — I5022 Chronic systolic (congestive) heart failure: Secondary | ICD-10-CM | POA: Insufficient documentation

## 2022-05-31 DIAGNOSIS — N183 Chronic kidney disease, stage 3 unspecified: Secondary | ICD-10-CM | POA: Insufficient documentation

## 2022-05-31 DIAGNOSIS — Z951 Presence of aortocoronary bypass graft: Secondary | ICD-10-CM | POA: Insufficient documentation

## 2022-05-31 DIAGNOSIS — I130 Hypertensive heart and chronic kidney disease with heart failure and stage 1 through stage 4 chronic kidney disease, or unspecified chronic kidney disease: Secondary | ICD-10-CM | POA: Insufficient documentation

## 2022-05-31 HISTORY — DX: Presence of aortocoronary bypass graft: Z95.1

## 2022-05-31 HISTORY — DX: Chronic kidney disease, stage 3 unspecified: N18.30

## 2022-05-31 HISTORY — DX: Benign prostatic hyperplasia without lower urinary tract symptoms: N40.0

## 2022-05-31 HISTORY — DX: Diverticulosis of intestine, part unspecified, without perforation or abscess without bleeding: K57.90

## 2022-05-31 HISTORY — DX: Thoracic aortic ectasia: I77.810

## 2022-05-31 HISTORY — DX: Disorder of arteries and arterioles, unspecified: I77.9

## 2022-05-31 HISTORY — DX: Long term (current) use of anticoagulants: Z79.01

## 2022-05-31 HISTORY — DX: Atherosclerosis of aorta: I70.0

## 2022-05-31 HISTORY — DX: Presence of intraocular lens: Z96.1

## 2022-05-31 HISTORY — DX: Cardiomyopathy, unspecified: I42.9

## 2022-05-31 HISTORY — DX: Presence of intraocular lens: Z98.49

## 2022-05-31 HISTORY — DX: Claustrophobia: F40.240

## 2022-05-31 HISTORY — DX: Benign neoplasm of colon, unspecified: D12.6

## 2022-05-31 LAB — GLUCOSE, CAPILLARY
Glucose-Capillary: 105 mg/dL — ABNORMAL HIGH (ref 70–99)
Glucose-Capillary: 185 mg/dL — ABNORMAL HIGH (ref 70–99)

## 2022-05-31 SURGERY — CHOLECYSTECTOMY, ROBOT-ASSISTED, LAPAROSCOPIC
Anesthesia: General

## 2022-05-31 MED ORDER — MIDAZOLAM HCL 2 MG/2ML IJ SOLN
INTRAMUSCULAR | Status: AC
  Filled 2022-05-31: qty 2

## 2022-05-31 MED ORDER — HYDROCODONE-ACETAMINOPHEN 5-325 MG PO TABS: 1.0000 | ORAL_TABLET | Freq: Four times a day (QID) | ORAL | 0 refills | Status: DC | PRN

## 2022-05-31 MED ORDER — FENTANYL CITRATE (PF) 100 MCG/2ML IJ SOLN
INTRAMUSCULAR | Status: DC | PRN
  Administered 2022-05-31: 25 ug via INTRAVENOUS
  Administered 2022-05-31: 50 ug via INTRAVENOUS
  Administered 2022-05-31: 25 ug via INTRAVENOUS

## 2022-05-31 MED ORDER — PROPOFOL 10 MG/ML IV BOLUS
INTRAVENOUS | Status: DC | PRN
  Administered 2022-05-31: 100 mg via INTRAVENOUS

## 2022-05-31 MED ORDER — INDOCYANINE GREEN 25 MG IV SOLR
1.2500 mg | Freq: Once | INTRAVENOUS | Status: AC
  Administered 2022-05-31: 1.25 mg via INTRAVENOUS
  Filled 2022-05-31: qty 0.5

## 2022-05-31 MED ORDER — LIDOCAINE HCL (CARDIAC) PF 100 MG/5ML IV SOSY
PREFILLED_SYRINGE | INTRAVENOUS | Status: DC | PRN
  Administered 2022-05-31: 100 mg via INTRAVENOUS

## 2022-05-31 MED ORDER — GABAPENTIN 300 MG PO CAPS: 300.0000 mg | ORAL_CAPSULE | ORAL | Status: AC

## 2022-05-31 MED ORDER — SUGAMMADEX SODIUM 500 MG/5ML IV SOLN
INTRAVENOUS | Status: DC | PRN
  Administered 2022-05-31: 300 mg via INTRAVENOUS

## 2022-05-31 MED ORDER — BUPIVACAINE-EPINEPHRINE (PF) 0.25% -1:200000 IJ SOLN
INTRAMUSCULAR | Status: DC | PRN
  Administered 2022-05-31: 30 mL

## 2022-05-31 MED ORDER — OXYCODONE HCL 5 MG PO TABS: 5.0000 mg | ORAL_TABLET | Freq: Once | ORAL | Status: AC | PRN

## 2022-05-31 MED ORDER — CHLORHEXIDINE GLUCONATE CLOTH 2 % EX PADS: 6.0000 | MEDICATED_PAD | Freq: Once | CUTANEOUS | Status: DC

## 2022-05-31 MED ORDER — ONDANSETRON HCL 4 MG/2ML IJ SOLN
INTRAMUSCULAR | Status: DC | PRN
  Administered 2022-05-31: 4 mg via INTRAVENOUS

## 2022-05-31 MED ORDER — FENTANYL CITRATE (PF) 100 MCG/2ML IJ SOLN
INTRAMUSCULAR | Status: AC
  Administered 2022-05-31: 25 ug via INTRAVENOUS
  Filled 2022-05-31: qty 2

## 2022-05-31 MED ORDER — CEFAZOLIN SODIUM-DEXTROSE 2-4 GM/100ML-% IV SOLN
INTRAVENOUS | Status: AC
  Filled 2022-05-31: qty 100

## 2022-05-31 MED ORDER — CHLORHEXIDINE GLUCONATE CLOTH 2 % EX PADS
6.0000 | MEDICATED_PAD | Freq: Once | CUTANEOUS | Status: AC
  Administered 2022-05-31: 6 via TOPICAL

## 2022-05-31 MED ORDER — MIDAZOLAM HCL 2 MG/2ML IJ SOLN
INTRAMUSCULAR | Status: DC | PRN
  Administered 2022-05-31: 2 mg via INTRAVENOUS

## 2022-05-31 MED ORDER — GABAPENTIN 300 MG PO CAPS
ORAL_CAPSULE | ORAL | Status: AC
  Administered 2022-05-31: 300 mg via ORAL
  Filled 2022-05-31: qty 1

## 2022-05-31 MED ORDER — CHLORHEXIDINE GLUCONATE 0.12 % MT SOLN
OROMUCOSAL | Status: AC
  Administered 2022-05-31: 15 mL via OROMUCOSAL
  Filled 2022-05-31: qty 15

## 2022-05-31 MED ORDER — EPHEDRINE SULFATE (PRESSORS) 50 MG/ML IJ SOLN
INTRAMUSCULAR | Status: DC | PRN
  Administered 2022-05-31: 10 mg via INTRAVENOUS
  Administered 2022-05-31 (×2): 5 mg via INTRAVENOUS

## 2022-05-31 MED ORDER — OXYCODONE HCL 5 MG/5ML PO SOLN: 5.0000 mg | Freq: Once | ORAL | Status: AC | PRN

## 2022-05-31 MED ORDER — SODIUM CHLORIDE 0.9 % IV SOLN: INTRAVENOUS | Status: DC

## 2022-05-31 MED ORDER — SODIUM CHLORIDE 0.9 % IR SOLN
Status: DC | PRN
  Administered 2022-05-31: 200 mL

## 2022-05-31 MED ORDER — SUCCINYLCHOLINE CHLORIDE 200 MG/10ML IV SOSY
PREFILLED_SYRINGE | INTRAVENOUS | Status: DC | PRN
  Administered 2022-05-31: 100 mg via INTRAVENOUS

## 2022-05-31 MED ORDER — CHLORHEXIDINE GLUCONATE 0.12 % MT SOLN: 15.0000 mL | Freq: Once | OROMUCOSAL | Status: AC

## 2022-05-31 MED ORDER — ACETAMINOPHEN 500 MG PO TABS: 1000.0000 mg | ORAL_TABLET | ORAL | Status: AC

## 2022-05-31 MED ORDER — BUPIVACAINE LIPOSOME 1.3 % IJ SUSP: 20.0000 mL | Freq: Once | INTRAMUSCULAR | Status: DC

## 2022-05-31 MED ORDER — CEFAZOLIN SODIUM-DEXTROSE 2-4 GM/100ML-% IV SOLN
2.0000 g | INTRAVENOUS | Status: AC
  Administered 2022-05-31: 2 g via INTRAVENOUS

## 2022-05-31 MED ORDER — FENTANYL CITRATE (PF) 100 MCG/2ML IJ SOLN
INTRAMUSCULAR | Status: AC
  Filled 2022-05-31: qty 2

## 2022-05-31 MED ORDER — ROCURONIUM BROMIDE 100 MG/10ML IV SOLN
INTRAVENOUS | Status: DC | PRN
  Administered 2022-05-31: 20 mg via INTRAVENOUS
  Administered 2022-05-31: 40 mg via INTRAVENOUS
  Administered 2022-05-31: 20 mg via INTRAVENOUS

## 2022-05-31 MED ORDER — ORAL CARE MOUTH RINSE: 15.0000 mL | Freq: Once | OROMUCOSAL | Status: AC

## 2022-05-31 MED ORDER — ACETAMINOPHEN 500 MG PO TABS
ORAL_TABLET | ORAL | Status: AC
  Administered 2022-05-31: 1000 mg via ORAL
  Filled 2022-05-31: qty 2

## 2022-05-31 MED ORDER — BUPIVACAINE-EPINEPHRINE (PF) 0.25% -1:200000 IJ SOLN
INTRAMUSCULAR | Status: AC
  Filled 2022-05-31: qty 30

## 2022-05-31 MED ORDER — OXYCODONE HCL 5 MG PO TABS
ORAL_TABLET | ORAL | Status: AC
  Administered 2022-05-31: 5 mg via ORAL
  Filled 2022-05-31: qty 1

## 2022-05-31 MED ORDER — LACTATED RINGERS IV SOLN: INTRAVENOUS | Status: DC | PRN

## 2022-05-31 MED ORDER — FENTANYL CITRATE (PF) 100 MCG/2ML IJ SOLN
25.0000 ug | INTRAMUSCULAR | Status: DC | PRN
  Administered 2022-05-31: 25 ug via INTRAVENOUS

## 2022-05-31 SURGICAL SUPPLY — 46 items
ADH SKN CLS APL DERMABOND .7 (GAUZE/BANDAGES/DRESSINGS) ×1
BAG PRESSURE INF REUSE 3000 (BAG) IMPLANT
CLIP LIGATING HEM O LOK PURPLE (MISCELLANEOUS) ×1 IMPLANT
COVER TIP SHEARS 8 DVNC (MISCELLANEOUS) ×1 IMPLANT
COVER TIP SHEARS 8MM DA VINCI (MISCELLANEOUS) ×1
DERMABOND ADVANCED .7 DNX12 (GAUZE/BANDAGES/DRESSINGS) ×1 IMPLANT
DRAPE ARM DVNC X/XI (DISPOSABLE) ×4 IMPLANT
DRAPE COLUMN DVNC XI (DISPOSABLE) ×1 IMPLANT
DRAPE DA VINCI XI ARM (DISPOSABLE) ×4
DRAPE DA VINCI XI COLUMN (DISPOSABLE) ×1
ELECT CAUTERY BLADE 6.4 (BLADE) ×1 IMPLANT
GLOVE ORTHO TXT STRL SZ7.5 (GLOVE) ×2 IMPLANT
GOWN STRL REUS W/ TWL LRG LVL3 (GOWN DISPOSABLE) ×2 IMPLANT
GOWN STRL REUS W/ TWL XL LVL3 (GOWN DISPOSABLE) ×2 IMPLANT
GOWN STRL REUS W/TWL LRG LVL3 (GOWN DISPOSABLE) ×2
GOWN STRL REUS W/TWL XL LVL3 (GOWN DISPOSABLE) ×2
GRASPER SUT TROCAR 14GX15 (MISCELLANEOUS) IMPLANT
IRRIGATION STRYKERFLOW (MISCELLANEOUS) IMPLANT
IRRIGATOR STRYKERFLOW (MISCELLANEOUS)
IRRIGATOR SUCT 8 DISP DVNC XI (IRRIGATION / IRRIGATOR) IMPLANT
IRRIGATOR SUCTION 8MM XI DISP (IRRIGATION / IRRIGATOR) ×1
IV NS IRRIG 3000ML ARTHROMATIC (IV SOLUTION) IMPLANT
KIT PINK PAD W/HEAD ARE REST (MISCELLANEOUS) ×1
KIT PINK PAD W/HEAD ARM REST (MISCELLANEOUS) ×1 IMPLANT
KIT TURNOVER KIT A (KITS) ×1 IMPLANT
LABEL OR SOLS (LABEL) ×1 IMPLANT
MANIFOLD NEPTUNE II (INSTRUMENTS) ×1 IMPLANT
NDL INSUFFLATION 14GA 120MM (NEEDLE) IMPLANT
NEEDLE HYPO 22GX1.5 SAFETY (NEEDLE) ×1 IMPLANT
NEEDLE INSUFFLATION 14GA 120MM (NEEDLE) ×1 IMPLANT
NS IRRIG 500ML POUR BTL (IV SOLUTION) ×1 IMPLANT
PACK LAP CHOLECYSTECTOMY (MISCELLANEOUS) ×1 IMPLANT
SEAL CANN UNIV 5-8 DVNC XI (MISCELLANEOUS) ×4 IMPLANT
SEAL XI 5MM-8MM UNIVERSAL (MISCELLANEOUS) ×4
SET TUBE SMOKE EVAC HIGH FLOW (TUBING) ×1 IMPLANT
SOLUTION ELECTROLUBE (MISCELLANEOUS) ×1 IMPLANT
SPIKE FLUID TRANSFER (MISCELLANEOUS) ×1 IMPLANT
SUT MNCRL 4-0 (SUTURE) ×1
SUT MNCRL 4-0 27XMFL (SUTURE) ×1
SUT VICRYL 0 AB UR-6 (SUTURE) ×1 IMPLANT
SUTURE MNCRL 4-0 27XMF (SUTURE) ×1 IMPLANT
SYS BAG RETRIEVAL 10MM (BASKET) ×1
SYSTEM BAG RETRIEVAL 10MM (BASKET) ×1 IMPLANT
TRAP FLUID SMOKE EVACUATOR (MISCELLANEOUS) ×1 IMPLANT
TROCAR Z-THREAD FIOS 11X100 BL (TROCAR) IMPLANT
WATER STERILE IRR 500ML POUR (IV SOLUTION) ×1 IMPLANT

## 2022-05-31 NOTE — Anesthesia Preprocedure Evaluation (Addendum)
Anesthesia Evaluation  Patient identified by MRN, date of birth, ID band Patient awake    Reviewed: Allergy & Precautions, NPO status , Patient's Chart, lab work & pertinent test results  History of Anesthesia Complications Negative for: history of anesthetic complications  Airway Mallampati: III  TM Distance: >3 FB Neck ROM: full    Dental  (+) Chipped, Poor Dentition, Missing   Pulmonary neg pulmonary ROS, neg shortness of breath,    Pulmonary exam normal        Cardiovascular Exercise Tolerance: Good hypertension, (-) angina+ CAD and + CABG  + dysrhythmias Atrial Fibrillation      Neuro/Psych  Neuromuscular disease negative psych ROS   GI/Hepatic Neg liver ROS, GERD  Controlled,  Endo/Other  negative endocrine ROSdiabetes, Type 2  Renal/GU Renal disease     Musculoskeletal   Abdominal   Peds  Hematology negative hematology ROS (+)   Anesthesia Other Findings Baseline hoarseness   Past Medical History: No date: Adenomatous colon polyp No date: Aortic atherosclerosis (HCC) No date: Arthritis No date: Ascending aorta dilatation (HCC)     Comment:  a.) TTE 11/23/2017: asc Ao measured 37 mm; b.) TTE               10/23/2021: Ao root measured 41 mm, asc Ao measured 38               mm; c.) TEE 10/28/2021: asc Ao measured 38 mm; d.) TTE               01/11/2022: Ao root 40 mm, asc Ao 39 mm 10/2021: Ascending cholangitis No date: BPH (benign prostatic hyperplasia) No date: Cardiomyopathy ([place]) No date: CKD (chronic kidney disease), stage III (HCC) No date: Claustrophobia No date: Coronary artery disease     Comment:  a.) LHC 12/21/2017: 75% mLAD, 90% D1, 80-99% OM1/2, CTO               pRCA (L-R collaterals) --> CVTS consult. b.) 3v CABG               01/18/2018 No date: Diverticulosis No date: Dyspnea 10/2021: Endocarditis of mitral valve     Comment:  In setting of bacteremia from ascending colangitis No  date: GERD (gastroesophageal reflux disease) No date: HFrEF (heart failure with reduced ejection fraction) ([place])     Comment:  a.) TTE 11/23/2017: EF 50-55%, mod MAC, triv TR, G1DD;               b.) TTE 10/23/2021: EF 40-45%, mild LAE, Ao sclerosis,               triv MR, G2DD; c.) TEE 10/28/2021: EF 40-45%, glob HK,               mobile vegitation on MV; d.) TTE 01/11/2022: EF 50-55%,               mild LVH, RVE, Ao sclerosis, mild MR/AR, G1DD No date: History of cholelithiasis No date: History of kidney stones     Comment:  ca ox [name] @ [employer]) now [employer] No date: History of pneumonia No date: HLD (hyperlipidemia) No date: HTN (hypertension) No date: Jaundice     Comment:  age 81 No date: Long term current use of anticoagulant     Comment:  a.) apixaban No date: Paroxysmal atrial fibrillation (HCC)     Comment:  a.) CHA2DS2VASc = 6 (age x 2, HFrEF, HTN, vascular  disease history, T2DM);  b.) rate/rhythm maintained on               oral metoprolol succinate; chronically anticoagulated               with apixaban No date: Pneumonia No date: Right-sided carotid artery disease ([place])     Comment:  a.) carotid doppler 04/05/2022: 1-39% RICA No date: S/P CABG x 3     Comment:  a.) LIMA-LAD, SVG-diagonal, SVG-PL branch of RCA No date: S/P cataract extraction and insertion of intraocular lens 2010: T2DM (type 2 diabetes mellitus) ([place])  Past Surgical History: No date: CARPAL TUNNEL RELEASE; Bilateral No date: CATARACT EXTRACTION, BILATERAL 11/2012: COLONOSCOPY     Comment:  11 adenomatous polyps, diverticulosis, rec rpt 1 yr               Ardis Hughs) 12/2013: COLONOSCOPY     Comment:  3 polyps, diverticulosis, rec rpt 3 yrs Ardis Hughs) 06/2019: COLONOSCOPY     Comment:  6 polyps (TA), diverticulosis, f/u left open ended               Ardis Hughs) 01/18/2018: CORONARY ARTERY BYPASS GRAFT; N/A     Comment:  Procedure: CORONARY ARTERY BYPASS GRAFTING (CABG) x 3;                Using Left Internal Mammary Artery, and Right Greater               Saphenous Vein harvested Endoscopically, Coronary Artery               Endarterectomy;  Surgeon: Ivin Poot, MD;                Location: [place];  Service: Open Heart Surgery;                Laterality: N/A; 10/23/2021: ERCP; N/A     Comment:  Procedure: ENDOSCOPIC RETROGRADE               CHOLANGIOPANCREATOGRAPHY (ERCP);  Surgeon: Ladene Artist, MD;  Location: [facility] ENDOSCOPY;  Service: Endoscopy;                Laterality: N/A; No date: KNEE CARTILAGE SURGERY; Left 12/21/2017: LEFT HEART CATH AND CORONARY ANGIOGRAPHY; N/A     Comment:  Procedure: LEFT HEART CATH AND CORONARY ANGIOGRAPHY;                Surgeon: Nelva Bush, MD;  Location: [place] CV               LAB;  Service: Cardiovascular;  Laterality: N/A; No date: LITHOTRIPSY 10/23/2021: REMOVAL OF STONES     Comment:  Procedure: REMOVAL OF STONES;  Surgeon: Ladene Artist, MD;  Location: [facility] ENDOSCOPY;  Service: Endoscopy;; 10/23/2021: SPHINCTEROTOMY     Comment:  Procedure: SPHINCTEROTOMY;  Surgeon: Ladene Artist,               MD;  Location: [place];  Service: Endoscopy;; 01/18/2018: TEE WITHOUT CARDIOVERSION; N/A     Comment:  Procedure: TRANSESOPHAGEAL ECHOCARDIOGRAM (TEE);                Surgeon: Prescott Gum, Collier Salina, MD;  Location: [place];                Service: Open Heart Surgery;  Laterality: N/A; 10/28/2021: TEE WITHOUT CARDIOVERSION; N/A     Comment:  Procedure: TRANSESOPHAGEAL ECHOCARDIOGRAM (TEE);                Surgeon: Pixie Casino, MD;  Location: [facility] ENDOSCOPY;                Service: Cardiovascular;  Laterality: N/A; No date: UMBILICAL HERNIA REPAIR     Comment:  with mesh  BMI    Body Mass Index: 33.48 kg/m      Reproductive/Obstetrics negative OB ROS                            Anesthesia Physical Anesthesia Plan  ASA: 3  Anesthesia Plan: General ETT   Post-op  Pain Management:    Induction: Intravenous  PONV Risk Score and Plan: Ondansetron, Dexamethasone, Midazolam and Treatment may vary due to age or medical condition  Airway Management Planned: Oral ETT and Video Laryngoscope Planned  Additional Equipment:   Intra-op Plan:   Post-operative Plan: Extubation in OR  Informed Consent: I have reviewed the patients History and Physical, chart, labs and discussed the procedure including the risks, benefits and alternatives for the proposed anesthesia with the patient or authorized representative who has indicated his/her understanding and acceptance.     Dental Advisory Given  Plan Discussed with: Anesthesiologist, CRNA and Surgeon  Anesthesia Plan Comments: (Patient reports baseline hoarsness from a previous intubation which he reports lead to damage of his vocal cords.  Patient consented that this type of damage is always a risk with intubation and he voiced assent.  Plan to intubate with video laryngoscope   Patient consented for risks of anesthesia including but not limited to:  - adverse reactions to medications - damage to eyes, teeth, lips or other oral mucosa - nerve damage due to positioning  - sore throat or hoarseness - Damage to heart, brain, nerves, lungs, other parts of body or loss of life  Patient voiced understanding.)       Anesthesia Quick Evaluation

## 2022-05-31 NOTE — Op Note (Signed)
Robotic cholecystectomy with Indocyamine Green Ductal Imaging.   Pre-operative Diagnosis: Chronic calculus cholecystitis, history of ascending cholangitis, and history of probable hepatic abscess  Post-operative Diagnosis:  Same.  Procedure: Robotic assisted laparoscopic cholecystectomy with Indocyamine Green Ductal Imaging.   Surgeon: Ronny Bacon, M.D., FACS  Anesthesia: General. with endotracheal tube  Findings: Omental adhesions to the dome of the right hepatic lobe, no adhesions of omentum to the adjacent liver or the gallbladder itself.  No evidence of hepatic nodularity, peritoneal studding.  Dilated cystic duct gallbladder neck junction.  Estimated Blood Loss: 25 mL         Drains: None         Specimens: Gallbladder           Complications: none  Procedure Details  The patient was seen again in the Holding Room.  1.25 mg dose of ICG was administered intravenously.   The benefits, complications, treatment options, risks and expected outcomes were again reviewed with the patient. The likelihood of improving the patient's symptoms with return to their baseline status is good.  The patient and/or family concurred with the proposed plan, giving informed consent, again alternatives reviewed.  The patient was taken to Operating Room, identified, and the procedure verified as robotic assisted laparoscopic cholecystectomy.  Prior to the induction of general anesthesia, antibiotic prophylaxis was administered. VTE prophylaxis was in place. General endotracheal anesthesia was then administered and tolerated well. The patient was positioned in the supine position.  After the induction, the abdomen was prepped with Chloraprep and draped in the sterile fashion.  A Time Out was held and the above information confirmed.    Right para-umbilical local infiltration with quarter percent Marcaine with epinephrine is utilized.  Made a 12 mm incision on the right periumbilical site, I advanced an  optical 69m port under direct visualization into the peritoneal cavity.  Once the peritoneum was penetrated, insufflation was initiated.  The trocar was then advanced into the abdominal cavity under direct visualization. Pneumoperitoneum was then continued utilizing CO2 at 15 mmHg or less and tolerated well without any adverse changes in the patient's vital signs.  Two 8.5-mm ports were placed in the left lower quadrant and laterally, and one to the right lower quadrant, all under direct vision. All skin incisions  were infiltrated with a local anesthetic agent before making the incision and placing the trocars.  The patient was positioned  in reverse Trendelenburg, tilted the patient's left side down.  Da Vinci XI robot was then positioned on to the patient's left side, and docked. Omentum was seen covering the right hepatic lobe adherent to the dome of the liver.  This was adherent along the junction between the liver and the dense scarring to the right hemidiaphragm.  The omentum was divided with bipolar cautery and monopolar scissors maintaining excellent hemostasis.  This released the omentum to be pulled down into the lower part of the abdomen and thus freeing the anterior surface of the liver and the gallbladder without any additional adhesive process. The gallbladder was identified, the fundus grasped via the arm 4 Prograsp and retracted cephalad. Adhesions were lysed with scissors and cautery.  The infundibulum was identified grasped and retracted laterally, exposing the peritoneum overlying the triangle of Calot. This was then opened and dissected using cautery & scissors. An extended critical view of the widened gallbladder neck junction with cystic duct and an anteriorly located cystic artery was obtained, aided by the ICG via FireFly which improved localization of the ductal  anatomy.  The cystic artery was isolated clipped and divided with bipolar. A cystotomy was made inadvertently in the  gallbladder neck during dissection.  I then utilized the suction irrigator to do further blunt dissection and defining the structures within the triangle of Calot.  The cystic duct was clearly identified and dissected to isolation.   The cystic duct was quadruple clipped and divided with scissors, as close to the gallbladder neck as feasible, thus leaving 3 on the remaining dilated cystic duct stump.    The gallbladder was taken from the gallbladder fossa in a retrograde fashion with the electrocautery. The gallbladder was removed and placed in an Endocatch bag.  The liver bed is inspected. Hemostasis was confirmed.  The robot was undocked and moved away from the operative field. Irrigation was utilized and was aspirated clear.  The gallbladder and Endocatch sac were then removed through the infraumbilical port site.   Inspection of the right upper quadrant was performed. No bleeding, bile duct injury or leak, or bowel injury was noted. The infra-umbilical port site fascia was closed with interrumpted 0 Vicryl sutures using PMI/cone under direct visualization. Pneumoperitoneum was released and ports removed.  4-0 subcuticular Monocryl was used to close the skin. Dermabond was  applied.  The patient was then extubated and brought to the recovery room in stable condition. Sponge, lap, and needle counts were correct at closure and at the conclusion of the case.               Ronny Bacon, M.D., [facility] 05/31/2022 10:44 AM

## 2022-05-31 NOTE — Discharge Instructions (Signed)
AMBULATORY SURGERY  ?DISCHARGE INSTRUCTIONS ? ? ?The drugs that you were given will stay in your system until tomorrow so for the next 24 hours you should not: ? ?Drive an automobile ?Make any legal decisions ?Drink any alcoholic beverage ? ? ?You may resume regular meals tomorrow.  Today it is better to start with liquids and gradually work up to solid foods. ? ?You may eat anything you prefer, but it is better to start with liquids, then soup and crackers, and gradually work up to solid foods. ? ? ?Please notify your doctor immediately if you have any unusual bleeding, trouble breathing, redness and pain at the surgery site, drainage, fever, or pain not relieved by medication. ? ? ? ?Additional Instructions: ? ? ? ?Please contact your physician with any problems or Same Day Surgery at [phone], Monday through Friday 6 am to 4 pm, or [facility] Main number at [phone].  ?

## 2022-05-31 NOTE — Interval H&P Note (Signed)
History and Physical Interval Note:  05/31/2022 8:43 AM  [name] Young.  has presented today for surgery, with the diagnosis of Chronic calculous cholecystitis K80.10.  The various methods of treatment have been discussed with the patient and family. After consideration of risks, benefits and other options for treatment, the patient has consented to  Procedure(s): XI ROBOTIC ASSISTED LAPAROSCOPIC CHOLECYSTECTOMY (N/A) [place] (ICG) (N/A) as a surgical intervention.  The patient's history has been reviewed, patient examined, no change in status, stable for surgery.  I have reviewed the patient's chart and labs.  Questions were answered to the patient's satisfaction.     Ronny Bacon

## 2022-05-31 NOTE — Anesthesia Procedure Notes (Signed)
Procedure Name: Intubation Date/Time: 05/31/2022 9:17 AM  Performed by: Beverely Low, CRNAPre-anesthesia Checklist: Patient identified, Patient being monitored, Timeout performed, Emergency Drugs available and Suction available Patient Re-evaluated:Patient Re-evaluated prior to induction Oxygen Delivery Method: Circle system utilized Preoxygenation: Pre-oxygenation with 100% oxygen Induction Type: IV induction Ventilation: Mask ventilation without difficulty Laryngoscope Size: McGraph and 4 Grade View: Grade I Tube type: Oral Tube size: 6.5 mm Number of attempts: 1 Airway Equipment and Method: Stylet Placement Confirmation: ETT inserted through vocal cords under direct vision, positive ETCO2 and breath sounds checked- equal and bilateral Secured at: 21 cm Tube secured with: Tape Dental Injury: Teeth and Oropharynx as per pre-operative assessment

## 2022-05-31 NOTE — Anesthesia Postprocedure Evaluation (Signed)
Anesthesia Post Note  Patient: [name] Young.  Procedure(s) Performed: XI ROBOTIC ASSISTED LAPAROSCOPIC CHOLECYSTECTOMY INDOCYANINE GREEN FLUORESCENCE IMAGING (ICG)  Patient location during evaluation: PACU Anesthesia Type: General Level of consciousness: awake and alert Pain management: pain level controlled Vital Signs Assessment: post-procedure vital signs reviewed and stable Respiratory status: spontaneous breathing, nonlabored ventilation, respiratory function stable and patient connected to nasal cannula oxygen Cardiovascular status: blood pressure returned to baseline and stable Postop Assessment: no apparent nausea or vomiting Anesthetic complications: no   No notable events documented.   Last Vitals:  Vitals:   05/31/22 1145 05/31/22 1202  BP: (!) 142/63 (!) 154/60  Pulse: 63 60  Resp: 15 16  Temp: (!) 36.3 C   SpO2: 98% 100%    Last Pain:  Vitals:   05/31/22 1202  TempSrc: Temporal  PainSc: 0-No pain                 Precious Haws Leydi Winstead

## 2022-05-31 NOTE — Transfer of Care (Addendum)
Immediate Anesthesia Transfer of Care Note  Patient: [name] Young.  Procedure(s) Performed: XI ROBOTIC ASSISTED LAPAROSCOPIC CHOLECYSTECTOMY INDOCYANINE GREEN FLUORESCENCE IMAGING (ICG)  Patient Location: PACU  Anesthesia Type:General  Level of Consciousness: drowsy  Airway & Oxygen Therapy: Patient Spontanous Breathing and Patient connected to face mask oxygen  Post-op Assessment: Report given to RN and Post -op Vital signs reviewed and stable  Post vital signs: Reviewed and stable  Last Vitals:  Vitals Value Taken Time  BP    Temp    Pulse    Resp    SpO2      Last Pain:  Vitals:   05/31/22 0814  TempSrc: Temporal  PainSc: 0-No pain         Complications: No notable events documented.

## 2022-06-01 ENCOUNTER — Telehealth: Payer: Self-pay

## 2022-06-01 LAB — SURGICAL PATHOLOGY

## 2022-06-01 NOTE — Patient Outreach (Signed)
  Care Coordination   Initial Visit Note   06/01/2022 Name: [name] Young. MRN: [id number] DOB: [birth year]  [name] Young. is a 81 y.o. year old male who sees Ria Bush, MD for primary care. I  wife, [name] Young   What matters to the patients health and wellness today?  Wife states patient does not have any nursing or community resource needs at this time.    Goals Addressed             This Visit's Progress    Care coordination activities - no follow up needed.       Care coordination program/ services discussed  Social determinants of health survey completed Patient / caregiver advised to contact primary care provider office  if care coordination services needed in the future         SDOH assessments and interventions completed:  Yes  SDOH Interventions Today    Flowsheet Row Most Recent Value  SDOH Interventions   Food Insecurity Interventions Intervention Not Indicated  Housing Interventions Intervention Not Indicated  Transportation Interventions Intervention Not Indicated        Care Coordination Interventions Activated:  Yes  Care Coordination Interventions:  Yes, provided   Follow up plan: No further intervention required.   Encounter Outcome:  Pt. Visit Completed   Quinn Plowman RN,BSN,CCM [place] [phone] direct line

## 2022-06-02 ENCOUNTER — Telehealth: Payer: Self-pay | Admitting: Surgery

## 2022-06-02 NOTE — Telephone Encounter (Signed)
Patient had robotic cholecystectomy done on 05/31/22 with Dr. Christian Mate.  States the Hydrocodone is not working. They have also been using Tylenol, but this is not helping either.  No drug allergies. Wants to know if there is something else they can try.  Please call.  Thank you.

## 2022-06-13 ENCOUNTER — Other Ambulatory Visit: Payer: Self-pay

## 2022-06-13 ENCOUNTER — Ambulatory Visit (INDEPENDENT_AMBULATORY_CARE_PROVIDER_SITE_OTHER): Payer: Medicare PPO | Admitting: Physician Assistant

## 2022-06-13 ENCOUNTER — Encounter: Payer: Self-pay | Admitting: Physician Assistant

## 2022-06-13 VITALS — BP 134/74 | HR 75 | Temp 98.0°F | Ht 70.0 in | Wt 240.0 lb

## 2022-06-13 DIAGNOSIS — K8010 Calculus of gallbladder with chronic cholecystitis without obstruction: Secondary | ICD-10-CM

## 2022-06-13 DIAGNOSIS — Z09 Encounter for follow-up examination after completed treatment for conditions other than malignant neoplasm: Secondary | ICD-10-CM

## 2022-06-13 MED ORDER — CYCLOBENZAPRINE HCL 5 MG PO TABS: 5.0000 mg | ORAL_TABLET | Freq: Three times a day (TID) | ORAL | 0 refills | Status: DC | PRN

## 2022-06-13 NOTE — Progress Notes (Signed)
[facility] POST-OP OFFICE VISIT  06/13/2022  HPI: [name] Young. is a 81 y.o. male 13 days s/p robotic assisted laparoscopic cholecystectomy for [facility] with Dr Christian Mate   Doing reasonably well Still with bilateral lateral abdominal discomfort; worse with laying on his side; only using tylenol but this is not helping enough No fever, chills, nausea, emesis Bowel movements are baseline No issues with incisions No other complaints  Vital signs: BP 134/74   Pulse 75   Temp 98 F (36.7 C) (Oral)   Ht '5\' 10"'$  (1.778 m)   Wt 240 lb (108.9 kg)   SpO2 98%   BMI 34.44 kg/m    Physical Exam: Constitutional: Well appearing male, NAD Abdomen: Soft, No incisional tenderness, seems most tender in the lateral parts of his abdomen bilaterally, non-distended, no rebound/guarding Skin: Laparoscopic incisions are healing well, no erythema or drainage, there is healing ecchymosis present  Assessment/Plan: This is a 81 y.o. male 13 days s/p robotic assisted laparoscopic cholecystectomy for [place] with Dr Christian Mate    - Pain control prn; continue tylenol. Will send small Rx of Flexeril QHS  - Reviewed wound care recommendation  - Reviewed lifting restrictions; 4 weeks total  - Reviewed surgical pathology; [place]  - I will see him again in 2 weeks for recheck; He, and his wife, understand to call with questions/concerns  -- Edison Simon, PA-C [facility] 06/13/2022, 3:06 PM M-F: 7am - 4pm

## 2022-06-15 ENCOUNTER — Ambulatory Visit: Payer: Medicare PPO | Admitting: Podiatry

## 2022-06-15 ENCOUNTER — Encounter: Payer: Self-pay | Admitting: Podiatry

## 2022-06-15 ENCOUNTER — Other Ambulatory Visit: Payer: Self-pay | Admitting: Internal Medicine

## 2022-06-15 DIAGNOSIS — M79674 Pain in right toe(s): Secondary | ICD-10-CM

## 2022-06-15 DIAGNOSIS — N183 Chronic kidney disease, stage 3 unspecified: Secondary | ICD-10-CM

## 2022-06-15 DIAGNOSIS — B351 Tinea unguium: Secondary | ICD-10-CM | POA: Diagnosis not present

## 2022-06-15 DIAGNOSIS — E1142 Type 2 diabetes mellitus with diabetic polyneuropathy: Secondary | ICD-10-CM | POA: Diagnosis not present

## 2022-06-15 DIAGNOSIS — M79675 Pain in left toe(s): Secondary | ICD-10-CM

## 2022-06-15 DIAGNOSIS — E1122 Type 2 diabetes mellitus with diabetic chronic kidney disease: Secondary | ICD-10-CM

## 2022-06-15 NOTE — Progress Notes (Signed)
This patient returns to my office for at risk foot care.  This patient requires this care by a professional since this patient will be at risk due to having diabetres and neuropathy.  Patient presents to the office with her husband.  This patient is unable to cut nails himself since the patient cannot reach his nails.These nails are painful walking and wearing shoes.  This patient presents for at risk foot care today.  General Appearance  Alert, conversant and in no acute stress.  Vascular  Dorsalis pedis and posterior tibial  pulses are weakly  palpable  bilaterally.  Capillary return is within normal limits  bilaterally. Cold feet  Bilaterally. Absent digital hair.  Neurologic  Senn-Weinstein monofilament wire test absent  bilaterally. Muscle power within normal limits bilaterally.  Nails Thick disfigured discolored nails with subungual debris  from hallux to fifth toes bilaterally. No evidence of bacterial infection or drainage bilaterally.  Orthopedic  No limitations of motion  feet .  No crepitus or effusions noted.  No bony pathology or digital deformities noted.  Skin  normotropic skin with no porokeratosis noted bilaterally.  No signs of infections or ulcers noted.     Onychomycosis  Pain in right toes  Pain in left toes  Consent was obtained for treatment procedures.   Mechanical debridement of nails 1-5  bilaterally performed with a nail nipper.  Filed with dremel without incident.    Return office visit   12 weeks                 Told patient to return for periodic foot care and evaluation due to potential at risk complications.   Victoriah Wilds DPM  

## 2022-06-17 ENCOUNTER — Other Ambulatory Visit: Payer: Self-pay | Admitting: Family Medicine

## 2022-06-17 DIAGNOSIS — E538 Deficiency of other specified B group vitamins: Secondary | ICD-10-CM

## 2022-06-17 DIAGNOSIS — E1169 Type 2 diabetes mellitus with other specified complication: Secondary | ICD-10-CM

## 2022-06-17 DIAGNOSIS — I480 Paroxysmal atrial fibrillation: Secondary | ICD-10-CM

## 2022-06-17 DIAGNOSIS — E559 Vitamin D deficiency, unspecified: Secondary | ICD-10-CM

## 2022-06-17 DIAGNOSIS — N183 Chronic kidney disease, stage 3 unspecified: Secondary | ICD-10-CM

## 2022-06-18 ENCOUNTER — Other Ambulatory Visit: Payer: Self-pay | Admitting: Family Medicine

## 2022-06-19 ENCOUNTER — Other Ambulatory Visit: Payer: Self-pay | Admitting: Family Medicine

## 2022-06-20 ENCOUNTER — Other Ambulatory Visit (INDEPENDENT_AMBULATORY_CARE_PROVIDER_SITE_OTHER): Payer: Medicare PPO

## 2022-06-20 DIAGNOSIS — E785 Hyperlipidemia, unspecified: Secondary | ICD-10-CM | POA: Diagnosis not present

## 2022-06-20 DIAGNOSIS — E1122 Type 2 diabetes mellitus with diabetic chronic kidney disease: Secondary | ICD-10-CM

## 2022-06-20 DIAGNOSIS — E559 Vitamin D deficiency, unspecified: Secondary | ICD-10-CM

## 2022-06-20 DIAGNOSIS — E1169 Type 2 diabetes mellitus with other specified complication: Secondary | ICD-10-CM | POA: Diagnosis not present

## 2022-06-20 DIAGNOSIS — N183 Chronic kidney disease, stage 3 unspecified: Secondary | ICD-10-CM | POA: Diagnosis not present

## 2022-06-20 LAB — CBC WITH DIFFERENTIAL/PLATELET
Basophils Absolute: 0.1 10*3/uL (ref 0.0–0.1)
Basophils Relative: 0.6 % (ref 0.0–3.0)
Eosinophils Absolute: 0 10*3/uL (ref 0.0–0.7)
Eosinophils Relative: 0.4 % (ref 0.0–5.0)
HCT: 34.3 % — ABNORMAL LOW (ref 39.0–52.0)
Hemoglobin: 11.1 g/dL — ABNORMAL LOW (ref 13.0–17.0)
Lymphocytes Relative: 17.2 % (ref 12.0–46.0)
Lymphs Abs: 1.8 10*3/uL (ref 0.7–4.0)
MCHC: 32.2 g/dL (ref 30.0–36.0)
MCV: 85.3 fl (ref 78.0–100.0)
Monocytes Absolute: 0.7 10*3/uL (ref 0.1–1.0)
Monocytes Relative: 6.2 % (ref 3.0–12.0)
Neutro Abs: 8.1 10*3/uL — ABNORMAL HIGH (ref 1.4–7.7)
Neutrophils Relative %: 75.6 % (ref 43.0–77.0)
Platelets: 466 10*3/uL — ABNORMAL HIGH (ref 150.0–400.0)
RBC: 4.03 Mil/uL — ABNORMAL LOW (ref 4.22–5.81)
RDW: 15.4 % (ref 11.5–15.5)
WBC: 10.7 10*3/uL — ABNORMAL HIGH (ref 4.0–10.5)

## 2022-06-20 LAB — MICROALBUMIN / CREATININE URINE RATIO
Creatinine,U: 124.6 mg/dL
Microalb Creat Ratio: 0.9 mg/g (ref 0.0–30.0)
Microalb, Ur: 1.1 mg/dL (ref 0.0–1.9)

## 2022-06-20 LAB — LIPID PANEL
Cholesterol: 76 mg/dL (ref 0–200)
HDL: 19.5 mg/dL — ABNORMAL LOW (ref >39.00)
LDL Cholesterol: 29 mg/dL (ref 0–99)
NonHDL: 56
Total CHOL/HDL Ratio: 4
Triglycerides: 133 mg/dL (ref 0.0–149.0)
VLDL: 26.6 mg/dL (ref 0.0–40.0)

## 2022-06-20 LAB — COMPREHENSIVE METABOLIC PANEL
ALT: 13 U/L (ref 0–53)
AST: 22 U/L (ref 0–37)
Albumin: 3.2 g/dL — ABNORMAL LOW (ref 3.5–5.2)
Alkaline Phosphatase: 64 U/L (ref 39–117)
BUN: 24 mg/dL — ABNORMAL HIGH (ref 6–23)
CO2: 26 mEq/L (ref 19–32)
Calcium: 9.4 mg/dL (ref 8.4–10.5)
Chloride: 103 mEq/L (ref 96–112)
Creatinine, Ser: 1.27 mg/dL (ref 0.40–1.50)
GFR: 53.55 mL/min — ABNORMAL LOW (ref >60.00)
Glucose, Bld: 138 mg/dL — ABNORMAL HIGH (ref 70–99)
Potassium: 4.5 mEq/L (ref 3.5–5.1)
Sodium: 139 mEq/L (ref 135–145)
Total Bilirubin: 0.5 mg/dL (ref 0.2–1.2)
Total Protein: 6.8 g/dL (ref 6.0–8.3)

## 2022-06-20 LAB — HEMOGLOBIN A1C: Hgb A1c MFr Bld: 6.8 % — ABNORMAL HIGH (ref 4.6–6.5)

## 2022-06-20 LAB — VITAMIN D 25 HYDROXY (VIT D DEFICIENCY, FRACTURES): VITD: 55.7 ng/mL (ref 30.00–100.00)

## 2022-06-21 LAB — PARATHYROID HORMONE, INTACT (NO CA): PTH: 11 pg/mL — ABNORMAL LOW (ref 16–77)

## 2022-06-27 ENCOUNTER — Ambulatory Visit (INDEPENDENT_AMBULATORY_CARE_PROVIDER_SITE_OTHER): Payer: Medicare PPO | Admitting: Physician Assistant

## 2022-06-27 ENCOUNTER — Ambulatory Visit (INDEPENDENT_AMBULATORY_CARE_PROVIDER_SITE_OTHER): Payer: Medicare PPO | Admitting: Family Medicine

## 2022-06-27 ENCOUNTER — Encounter: Payer: Self-pay | Admitting: Physician Assistant

## 2022-06-27 ENCOUNTER — Encounter: Payer: Self-pay | Admitting: Family Medicine

## 2022-06-27 VITALS — BP 126/60 | HR 68 | Temp 98.4°F | Ht 69.75 in | Wt 236.0 lb

## 2022-06-27 VITALS — BP 126/64 | HR 83 | Temp 97.9°F | Ht 69.75 in | Wt 238.0 lb

## 2022-06-27 DIAGNOSIS — E1169 Type 2 diabetes mellitus with other specified complication: Secondary | ICD-10-CM

## 2022-06-27 DIAGNOSIS — I480 Paroxysmal atrial fibrillation: Secondary | ICD-10-CM

## 2022-06-27 DIAGNOSIS — Z7189 Other specified counseling: Secondary | ICD-10-CM

## 2022-06-27 DIAGNOSIS — Z951 Presence of aortocoronary bypass graft: Secondary | ICD-10-CM

## 2022-06-27 DIAGNOSIS — Z0000 Encounter for general adult medical examination without abnormal findings: Secondary | ICD-10-CM

## 2022-06-27 DIAGNOSIS — Z09 Encounter for follow-up examination after completed treatment for conditions other than malignant neoplasm: Secondary | ICD-10-CM

## 2022-06-27 DIAGNOSIS — Z23 Encounter for immunization: Secondary | ICD-10-CM | POA: Diagnosis not present

## 2022-06-27 DIAGNOSIS — Z66 Do not resuscitate: Secondary | ICD-10-CM

## 2022-06-27 DIAGNOSIS — E669 Obesity, unspecified: Secondary | ICD-10-CM

## 2022-06-27 DIAGNOSIS — D649 Anemia, unspecified: Secondary | ICD-10-CM

## 2022-06-27 DIAGNOSIS — N183 Chronic kidney disease, stage 3 unspecified: Secondary | ICD-10-CM

## 2022-06-27 DIAGNOSIS — K8010 Calculus of gallbladder with chronic cholecystitis without obstruction: Secondary | ICD-10-CM

## 2022-06-27 DIAGNOSIS — I5022 Chronic systolic (congestive) heart failure: Secondary | ICD-10-CM

## 2022-06-27 DIAGNOSIS — K219 Gastro-esophageal reflux disease without esophagitis: Secondary | ICD-10-CM

## 2022-06-27 DIAGNOSIS — H9191 Unspecified hearing loss, right ear: Secondary | ICD-10-CM

## 2022-06-27 DIAGNOSIS — Z8719 Personal history of other diseases of the digestive system: Secondary | ICD-10-CM

## 2022-06-27 DIAGNOSIS — E538 Deficiency of other specified B group vitamins: Secondary | ICD-10-CM

## 2022-06-27 DIAGNOSIS — R160 Hepatomegaly, not elsewhere classified: Secondary | ICD-10-CM

## 2022-06-27 DIAGNOSIS — E559 Vitamin D deficiency, unspecified: Secondary | ICD-10-CM

## 2022-06-27 DIAGNOSIS — I10 Essential (primary) hypertension: Secondary | ICD-10-CM

## 2022-06-27 NOTE — Patient Instructions (Signed)
GENERAL POST-OPERATIVE PATIENT INSTRUCTIONS   WOUND CARE INSTRUCTIONS:  Try to keep the wound dry and avoid ointments on the wound unless directed to do so.  If the wound becomes bright red and painful or starts to drain infected material that is not clear, please contact your physician immediately.  If the wound is mildly pink and has a thick firm ridge underneath it, this is normal, and is referred to as a healing ridge.  This will resolve over the next 4-6 weeks.  BATHING: You may shower if you have been informed of this by your surgeon. However, Please do not submerge in a tub, hot tub, or pool until incisions are completely sealed or have been told by your surgeon that you may do so.  DIET:  You may eat any foods that you can tolerate.  It is a good idea to eat a high fiber diet and take in plenty of fluids to prevent constipation.  If you do become constipated you may want to take a mild laxative or take ducolax tablets on a daily basis until your bowel habits are regular.  Constipation can be very uncomfortable, along with straining, after recent surgery.  ACTIVITY:  You are encouraged to walk and engage in light activity for the next two weeks.  You should not lift more than 20 pounds for 6 weeks total after surgery as it could put you at increased risk for complications.  Twenty pounds is roughly equivalent to a plastic bag of groceries. At that time- Listen to your body when lifting, if you have pain when lifting, stop and then try again in a few days. Soreness after doing exercises or activities of daily living is normal as you get back in to your normal routine.  MEDICATIONS:  Try to take narcotic medications and anti-inflammatory medications, such as tylenol, ibuprofen, naprosyn, etc., with food.  This will minimize stomach upset from the medication.  Should you develop nausea and vomiting from the pain medication, or develop a rash, please discontinue the medication and contact your  physician.  You should not drive, make important decisions, or operate machinery when taking narcotic pain medication.  SUNBLOCK Use sun block to incision area over the next year if this area will be exposed to sun. This helps decrease scarring and will allow you avoid a permanent darkened area over your incision.  QUESTIONS:  Please feel free to call our office if you have any questions, and we will be glad to assist you. [phone]   

## 2022-06-27 NOTE — Patient Instructions (Addendum)
Flu shot today Good to see you today Let me know if you need referral to hearing doctor.  Return as needed or in 6 months for follow up visit with labs.   Health Maintenance After Age 80 After age 3, you are at a higher risk for certain long-term diseases and infections as well as injuries from falls. Falls are a major cause of broken bones and head injuries in people who are older than age 11. Getting regular preventive care can help to keep you healthy and well. Preventive care includes getting regular testing and making lifestyle changes as recommended by your health care provider. Talk with your health care provider about: Which screenings and tests you should have. A screening is a test that checks for a disease when you have no symptoms. A diet and exercise plan that is right for you. What should I know about screenings and tests to prevent falls? Screening and testing are the best ways to find a health problem early. Early diagnosis and treatment give you the best chance of managing medical conditions that are common after age 61. Certain conditions and lifestyle choices may make you more likely to have a fall. Your health care provider may recommend: Regular vision checks. Poor vision and conditions such as cataracts can make you more likely to have a fall. If you wear glasses, make sure to get your prescription updated if your vision changes. Medicine review. Work with your health care provider to regularly review all of the medicines you are taking, including over-the-counter medicines. Ask your health care provider about any side effects that may make you more likely to have a fall. Tell your health care provider if any medicines that you take make you feel dizzy or sleepy. Strength and balance checks. Your health care provider may recommend certain tests to check your strength and balance while standing, walking, or changing positions. Foot health exam. Foot pain and numbness, as well as  not wearing proper footwear, can make you more likely to have a fall. Screenings, including: Osteoporosis screening. Osteoporosis is a condition that causes the bones to get weaker and break more easily. Blood pressure screening. Blood pressure changes and medicines to control blood pressure can make you feel dizzy. Depression screening. You may be more likely to have a fall if you have a fear of falling, feel depressed, or feel unable to do activities that you used to do. Alcohol use screening. Using too much alcohol can affect your balance and may make you more likely to have a fall. Follow these instructions at home: Lifestyle Do not drink alcohol if: Your health care provider tells you not to drink. If you drink alcohol: Limit how much you have to: 0-1 drink a day for women. 0-2 drinks a day for men. Know how much alcohol is in your drink. In the [place] one drink equals one 12 oz bottle of beer (355 mL), one 5 oz glass of wine (148 mL), or one 1 oz glass of hard liquor (44 mL). Do not use any products that contain nicotine or tobacco. These products include cigarettes, chewing tobacco, and vaping devices, such as e-cigarettes. If you need help quitting, ask your health care provider. Activity  Follow a regular exercise program to stay fit. This will help you maintain your balance. Ask your health care provider what types of exercise are appropriate for you. If you need a cane or walker, use it as recommended by your health care provider. Wear supportive shoes  that have nonskid soles. Safety  Remove any tripping hazards, such as rugs, cords, and clutter. Install safety equipment such as grab bars in bathrooms and safety rails on stairs. Keep rooms and walkways well-lit. General instructions Talk with your health care provider about your risks for falling. Tell your health care provider if: You fall. Be sure to tell your health care provider about all falls, even ones that seem  minor. You feel dizzy, tiredness (fatigue), or off-balance. Take over-the-counter and prescription medicines only as told by your health care provider. These include supplements. Eat a healthy diet and maintain a healthy weight. A healthy diet includes low-fat dairy products, low-fat (lean) meats, and fiber from whole grains, beans, and lots of fruits and vegetables. Stay current with your vaccines. Schedule regular health, dental, and eye exams. Summary Having a healthy lifestyle and getting preventive care can help to protect your health and wellness after age 30. Screening and testing are the best way to find a health problem early and help you avoid having a fall. Early diagnosis and treatment give you the best chance for managing medical conditions that are more common for people who are older than age 76. Falls are a major cause of broken bones and head injuries in people who are older than age 31. Take precautions to prevent a fall at home. Work with your health care provider to learn what changes you can make to improve your health and wellness and to prevent falls. This information is not intended to replace advice given to you by your health care provider. Make sure you discuss any questions you have with your health care provider. Document Revised: 01/17/2021 Document Reviewed: 01/17/2021 Elsevier Patient Education  [place].

## 2022-06-27 NOTE — Assessment & Plan Note (Signed)
Confirmed. Has goldenrod form at home.

## 2022-06-27 NOTE — Assessment & Plan Note (Signed)
Preventative protocols reviewed and updated unless pt declined. Discussed healthy diet and lifestyle.  

## 2022-06-27 NOTE — Progress Notes (Signed)
[facility] POST-OP OFFICE VISIT  06/27/2022  HPI: [name] Senat. is a 81 y.o. male 56 days s/p robotic assisted laparoscopic cholecystectomy for [facility] with Dr Christian Mate   Doing better Flexeril helped with pain noted during previous visit; only needed 2 of these No fever, chills, nausea, emesis Some fatigue and decreased appetite No issues with incisions No other complaints   Vital signs: BP 126/60   Pulse 68   Temp 98.4 F (36.9 C)   Ht 5' 9.75" (1.772 m)   Wt 236 lb (107 kg)   SpO2 99%   BMI 34.11 kg/m    Physical Exam: Constitutional: Well appearing male, NAD Abdomen: Soft, non-tender, non-distended, no rebound/guarding Skin: Laparoscopic incisions are healing well, no erythema or drainage   Assessment/Plan: This is a 81 y.o. male 27 days s/p robotic assisted laparoscopic cholecystectomy for [facility] with Dr Christian Mate    - Pain control prn; nothing further  - Reviewed wound care recommendations; he is healed  - Reviewed lifting restrictions; completed these  - He can follow up on as needed basis; He understands to call with questions/concerns  -- Edison Simon, PA-C [facility] 06/27/2022, 2:54 PM M-F: 7am - 4pm

## 2022-06-27 NOTE — Assessment & Plan Note (Signed)

## 2022-06-27 NOTE — Assessment & Plan Note (Signed)
Asked to bring us a copy 

## 2022-06-27 NOTE — Progress Notes (Unsigned)
Patient ID: Mattheus Rauls., male    DOB: 11-Feb-1941, 81 y.o.   MRN: [id number]  This visit was conducted in person.  BP 126/64   Pulse 83   Temp 97.9 F (36.6 C) (Temporal)   Ht 5' 9.75" (1.772 m)   Wt 238 lb (108 kg)   SpO2 99%   BMI 34.39 kg/m    CC: AMW  Subjective:   HPI: Milik Gilreath. is a 81 y.o. male presenting on 06/27/2022 for Medicare Wellness (Pt accompanied by wife, [name] )   Did not see health advisor this year.   Hearing Screening   500Hz  1000Hz  2000Hz  4000Hz   Right ear 40 0 0 0  Left ear 25 0 0 0  Comments: Per wife, pt's hearing has decreased.   Vision Screening - Comments:: Last eye exam, 09/2021.  [place] Office Visit from 06/27/2022 in [place] at [place]  PHQ-2 Total Score 0     Planning to see audiologist early 2024.     06/27/2022   10:00 AM 06/13/2022    2:35 PM 04/25/2022   10:08 AM 06/07/2021   11:26 AM 05/24/2020    8:35 AM  Fall Risk   Falls in the past year? 0 0 0 0 0    S/p 3v CABG and coronary endarterectomy 01/2018 for severe symptomatic CAD.  Hospitalization 10/2021 for ascending cholangitis with bacteremia s/p ERCP with sphincterotomy and gallstone extraction, complicated by afib and MV endocarditis. Received 6 wks of IV antibiotics via PICC line. Underwent subsequent lap chole by Dr Christian Mate 05/2022. Has upcoming gen surgery f/u later today.   Incidentally found mass above liver. This was unable to be biopsied. Advised could just monitor this. Discussed option of IR directed biopsy /aspiration. He prefers not to further evaluate. Denies RUQ abd pain, fever, jaundice, nausea.   Preventative: COLONOSCOPY 12/2013 3 polyps, diverticulosis, rec rpt 3 yrs Ardis Hughs).  COLONOSCOPY 06/2019 - 6 polyps (TA), diverticulosis, f/u left open ended Ardis Hughs) - wants to age out  Prostate - yearly check - PSA previously normal. No nocturia. aged out Lung cancer - not eligible  Flu shot - yearly  [place] 09/2019 x2, booster x2 06/2020, 01/2021  Pneumovax 09/2011, prevnar-13 04/2014 Td 09/2011 Shingrix - discussed, declines  Advanced directive: would want daughter RN to be HCPOA. Saw lawyer and completed. Asked to bring [place] copy. Does not want prolonged life support. Pt wants to be DNR/DNI.  Seat belt use discussed  Sunscreen use discussed. No changing moles on skin.  Non smoker Alcohol - none  Dentist yearly  Eye exam yearly - spring 2023 Bowel - no constipation. Diarrhea since cholecystectomy  Bladder - urge incontinence with leaking if he waits too long. Doesn't wear depends or pads. Declines medication.   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 Interim medical history since our last visit reviewed. Allergies and medications reviewed and updated. Outpatient Medications Prior to Visit  Medication Sig Dispense Refill   ACCU-CHEK AVIVA PLUS test strip USE AS DIRECTED TO CHECK BLOOD SUGARS UPTO FOUR TIMES DAILY. 100 each 3   blood glucose meter kit and supplies Dispense based on patient and insurance preference. Use up  to four times daily as directed. (FOR ICD-10 E10.9, E11.9). 1 each 0   Cholecalciferol (VITAMIN D3) 25 MCG (1000 UT) CAPS Take 1 capsule (1,000 Units total) by mouth daily. 30 capsule    Cyanocobalamin (B-12) 1000 MCG SUBL Place 1 tablet under the tongue daily.     cyclobenzaprine (FLEXERIL) 5 MG tablet Take 1 tablet (5 mg total) by mouth 3 (three) times daily as needed for muscle spasms. 15 tablet 0   ELIQUIS 5 MG TABS tablet TAKE 1 TABLET BY MOUTH TWICE A DAY 60 tablet 3   ENTRESTO 24-26 MG TAKE 1 TABLET BY MOUTH TWICE A DAY 60 tablet 4   FARXIGA 5 MG TABS tablet TAKE 1 TABLET BY  MOUTH EVERY DAY 30 tablet 3   fenofibrate (TRICOR) 145 MG tablet TAKE 1 TABLET BY MOUTH EVERY DAY 90 tablet 0   metFORMIN (GLUCOPHAGE) 500 MG tablet TAKE 1 TABLET BY MOUTH EVERY DAY WITH BREAKFAST 90 tablet 0   metoprolol succinate (TOPROL-XL) 50 MG 24 hr tablet Take 1 tablet (50 mg total) by mouth daily. Take with or immediately following a meal. 30 tablet 5   Multiple Vitamins-Minerals (MACULAR VITAMIN BENEFIT PO) Take 1 tablet by mouth daily.     nitroGLYCERIN (NITROSTAT) 0.4 MG SL tablet Place 1 tablet (0.4 mg total) under the tongue every 5 (five) minutes as needed for chest pain. 25 tablet 4   Omega-3 Fatty Acids (FISH OIL) 1200 MG CAPS Take 2 capsules (2,400 mg total) by mouth daily.     omeprazole (PRILOSEC) 40 MG capsule Take 1 capsule (40 mg total) by mouth every Monday, Wednesday, and Friday. 40 capsule 3   rosuvastatin (CRESTOR) 10 MG tablet TAKE 1 TABLET BY MOUTH EVERY DAY 90 tablet 3   tamsulosin (FLOMAX) 0.4 MG CAPS capsule TAKE 1 CAPSULE BY MOUTH EVERY DAY 90 capsule 0   aspirin EC 81 MG tablet Take 81 mg by mouth daily. Swallow whole.     diazepam (VALIUM) 10 MG tablet Take 10 mg by mouth once. (Patient not taking: Reported on 06/15/2022)     No facility-administered medications prior to visit.     Per HPI unless specifically indicated in ROS section below Review of Systems  Constitutional:  Negative for activity change, appetite change, chills, fatigue, fever and unexpected weight change.  HENT:  Negative for hearing loss.   Eyes:  Negative for visual disturbance.  Respiratory:  Negative for cough, chest tightness, shortness of breath and wheezing.   Cardiovascular:  Negative for chest pain, palpitations and leg swelling.  Gastrointestinal:  Positive for diarrhea. Negative for abdominal distention, abdominal pain, blood in stool, constipation, nausea and vomiting.  Genitourinary:  Negative for difficulty urinating and hematuria.  Musculoskeletal:  Negative for arthralgias,  myalgias and neck pain.  Skin:  Negative for rash.  Neurological:  Positive for weakness. Negative for dizziness, seizures, syncope and headaches.  Hematological:  Negative for adenopathy. Does not bruise/bleed easily.  Psychiatric/Behavioral:  Negative for dysphoric mood. The patient is not nervous/anxious.     Objective:  BP 126/64   Pulse 83   Temp 97.9 F (36.6 C) (Temporal)   Ht 5' 9.75" (1.772 m)   Wt 238 lb (108 kg)   SpO2 99%   BMI 34.39 kg/m   Wt Readings from Last 3 Encounters:  06/27/22 238 lb (108 kg)  06/13/22 240 lb (108.9 kg)  05/31/22 240 lb 1.3 oz (108.9 kg)      Physical Exam Vitals and nursing note reviewed.  Constitutional:      General: He is not in acute distress.    Appearance: Normal appearance. He is well-developed. He is not ill-appearing.  HENT:     Head: Normocephalic and atraumatic.     Right Ear: Hearing, tympanic membrane, ear canal and external ear normal.     Left Ear: Hearing, tympanic membrane, ear canal and external ear normal.     Mouth/Throat:     Mouth: Mucous membranes are moist.     Pharynx: Oropharynx is clear. No oropharyngeal exudate or posterior oropharyngeal erythema.  Eyes:     General: No scleral icterus.    Extraocular Movements: Extraocular movements intact.     Conjunctiva/sclera: Conjunctivae normal.     Pupils: Pupils are equal, round, and reactive to light.  Neck:     Thyroid: No thyroid mass or thyromegaly.  Cardiovascular:     Rate and Rhythm: Normal rate and regular rhythm.     Pulses: Normal pulses.          Radial pulses are 2+ on the right side and 2+ on the left side.     Heart sounds: Normal heart sounds. No murmur heard. Pulmonary:     Effort: Pulmonary effort is normal. No respiratory distress.     Breath sounds: Normal breath sounds. No wheezing, rhonchi or rales.  Abdominal:     General: Bowel sounds are normal. There is no distension.     Palpations: Abdomen is soft. There is no mass.      Tenderness: There is no abdominal tenderness. There is no guarding or rebound.     Hernia: No hernia is present.  Musculoskeletal:        General: Normal range of motion.     Cervical back: Normal range of motion and neck supple.     Right lower leg: No edema.     Left lower leg: No edema.  Lymphadenopathy:     Cervical: No cervical adenopathy.  Skin:    General: Skin is warm and dry.     Findings: No rash.  Neurological:     General: No focal deficit present.     Mental Status: He is alert and oriented to person, place, and time.     Comments:  Recall 3/3 Calculation 3/5 RLOW  Psychiatric:        Mood and Affect: Mood normal.        Behavior: Behavior normal.        Thought Content: Thought content normal.        Judgment: Judgment normal.       Results for orders placed or performed in visit on 06/20/22  Parathyroid hormone, intact (no Ca)  Result Value Ref Range   PTH 11 (L) 16 - 77 pg/mL  CBC with Differential/Platelet  Result Value Ref Range   WBC 10.7 (H) 4.0 - 10.5 K/uL   RBC 4.03 (L) 4.22 - 5.81 Mil/uL   Hemoglobin 11.1 (L) 13.0 - 17.0 g/dL   HCT 34.3 (L) 39.0 - 52.0 %   MCV 85.3 78.0 - 100.0 fl   MCHC 32.2 30.0 - 36.0 g/dL   RDW 15.4 11.5 - 15.5 %   Platelets 466.0 (H) 150.0 - 400.0 K/uL   Neutrophils Relative % 75.6 43.0 - 77.0 %   Lymphocytes Relative 17.2 12.0 - 46.0 %   Monocytes Relative 6.2 3.0 - 12.0 %   Eosinophils Relative 0.4 0.0 - 5.0 %   Basophils Relative 0.6 0.0 - 3.0 %  Neutro Abs 8.1 (H) 1.4 - 7.7 K/uL   Lymphs Abs 1.8 0.7 - 4.0 K/uL   Monocytes Absolute 0.7 0.1 - 1.0 K/uL   Eosinophils Absolute 0.0 0.0 - 0.7 K/uL   Basophils Absolute 0.1 0.0 - 0.1 K/uL  Microalbumin / creatinine urine ratio  Result Value Ref Range   Microalb, Ur 1.1 0.0 - 1.9 mg/dL   Creatinine,U 124.6 mg/dL   Microalb Creat Ratio 0.9 0.0 - 30.0 mg/g  Hemoglobin A1c  Result Value Ref Range   Hgb A1c MFr Bld 6.8 (H) 4.6 - 6.5 %  Comprehensive metabolic panel  Result  Value Ref Range   Sodium 139 135 - 145 mEq/L   Potassium 4.5 3.5 - 5.1 mEq/L   Chloride 103 96 - 112 mEq/L   CO2 26 19 - 32 mEq/L   Glucose, Bld 138 (H) 70 - 99 mg/dL   BUN 24 (H) 6 - 23 mg/dL   Creatinine, Ser 1.27 0.40 - 1.50 mg/dL   Total Bilirubin 0.5 0.2 - 1.2 mg/dL   Alkaline Phosphatase 64 39 - 117 U/L   AST 22 0 - 37 U/L   ALT 13 0 - 53 U/L   Total Protein 6.8 6.0 - 8.3 g/dL   Albumin 3.2 (L) 3.5 - 5.2 g/dL   GFR 53.55 (L) >60.00 mL/min   Calcium 9.4 8.4 - 10.5 mg/dL  Lipid panel  Result Value Ref Range   Cholesterol 76 0 - 200 mg/dL   Triglycerides 133.0 0.0 - 149.0 mg/dL   HDL 19.50 (L) >39.00 mg/dL   VLDL 26.6 0.0 - 40.0 mg/dL   LDL Cholesterol 29 0 - 99 mg/dL   Total CHOL/HDL Ratio 4    NonHDL 56.00   VITAMIN D 25 Hydroxy (Vit-D Deficiency, Fractures)  Result Value Ref Range   VITD 55.70 30.00 - 100.00 ng/mL    Assessment & Plan:   Problem List Items Addressed This Visit   None Visit Diagnoses     Need for influenza vaccination    -  Primary   Relevant Orders   Flu Vaccine QUAD High Dose(Fluad) (Completed)        No orders of the defined types were placed in this encounter.  Orders Placed This Encounter  Procedures   Flu Vaccine QUAD High Dose(Fluad)    Patient instructions: Flu shot today Good to see you today Let me know if you need referral to hearing doctor.  Return as needed or in 6 months for follow up visit with labs.   Follow up plan: No follow-ups on file.  Ria Bush, MD

## 2022-06-29 ENCOUNTER — Encounter: Payer: Self-pay | Admitting: Family Medicine

## 2022-06-29 ENCOUNTER — Ambulatory Visit: Payer: Medicare PPO | Admitting: Podiatry

## 2022-06-29 NOTE — Assessment & Plan Note (Signed)
Continue vit D replacement.  

## 2022-06-29 NOTE — Assessment & Plan Note (Signed)
Declines audiology evaluation at this time.

## 2022-06-29 NOTE — Assessment & Plan Note (Addendum)
Continues fenofibrate and crestor, as well as fish oil. Lipid levels overall well controlled except for chronically low HDL.  The ASCVD Risk score (Arnett DK, et al., 2019) failed to calculate for the following reasons:   The 2019 ASCVD risk score is only valid for ages 47 to 9   The patient has a prior MI or stroke diagnosis

## 2022-06-29 NOTE — Assessment & Plan Note (Addendum)
Unclear etiology, was not able to be biopsied during recent hospitalization.  Declines further evaluation of this at this time.

## 2022-06-29 NOTE — Assessment & Plan Note (Signed)
Chronic, stable with GFR 50s.

## 2022-06-29 NOTE — Assessment & Plan Note (Signed)
Persists. ?anemia of chronic disease.  Consider anemia panel next labs.

## 2022-06-29 NOTE — Assessment & Plan Note (Signed)
Chronic, stable on current regimen - continue. 

## 2022-06-29 NOTE — Assessment & Plan Note (Signed)
S/p cholecystectomy

## 2022-06-29 NOTE — Assessment & Plan Note (Addendum)
Sound regular. Stable period on eliquis and metoprolol.

## 2022-06-29 NOTE — Assessment & Plan Note (Signed)
Continue oral B12.

## 2022-06-29 NOTE — Assessment & Plan Note (Signed)
A1c remains controlled on metformin and farxiga - continue.

## 2022-06-29 NOTE — Assessment & Plan Note (Signed)
Continues MWF PPI

## 2022-06-29 NOTE — Assessment & Plan Note (Signed)
Seems euvolemic.  

## 2022-07-20 ENCOUNTER — Ambulatory Visit: Payer: Medicare PPO | Admitting: Internal Medicine

## 2022-07-23 ENCOUNTER — Other Ambulatory Visit: Payer: Self-pay | Admitting: Family Medicine

## 2022-08-03 ENCOUNTER — Other Ambulatory Visit: Payer: Self-pay | Admitting: Internal Medicine

## 2022-08-23 ENCOUNTER — Other Ambulatory Visit: Payer: Self-pay | Admitting: Internal Medicine

## 2022-08-23 DIAGNOSIS — I480 Paroxysmal atrial fibrillation: Secondary | ICD-10-CM

## 2022-08-23 NOTE — Telephone Encounter (Signed)
Refill request

## 2022-08-23 NOTE — Telephone Encounter (Signed)
Prescription refill request for Eliquis received. Indication: Afib  Last office visit: 03/29/22 (End)  Scr: 1.27 (06/20/22)  Age: 81 Weight: 107kg  Appropriate dose and refill sent to requested pharmacy.

## 2022-09-15 ENCOUNTER — Encounter: Payer: Self-pay | Admitting: Physician Assistant

## 2022-09-15 ENCOUNTER — Ambulatory Visit: Payer: Medicare PPO | Attending: Internal Medicine | Admitting: Cardiology

## 2022-09-15 VITALS — BP 138/64 | HR 69 | Ht 70.0 in | Wt 234.5 lb

## 2022-09-15 DIAGNOSIS — E1169 Type 2 diabetes mellitus with other specified complication: Secondary | ICD-10-CM

## 2022-09-15 DIAGNOSIS — I480 Paroxysmal atrial fibrillation: Secondary | ICD-10-CM

## 2022-09-15 DIAGNOSIS — E785 Hyperlipidemia, unspecified: Secondary | ICD-10-CM | POA: Diagnosis not present

## 2022-09-15 DIAGNOSIS — R059 Cough, unspecified: Secondary | ICD-10-CM | POA: Diagnosis not present

## 2022-09-15 DIAGNOSIS — I330 Acute and subacute infective endocarditis: Secondary | ICD-10-CM

## 2022-09-15 DIAGNOSIS — I77810 Thoracic aortic ectasia: Secondary | ICD-10-CM | POA: Diagnosis not present

## 2022-09-15 DIAGNOSIS — I2510 Atherosclerotic heart disease of native coronary artery without angina pectoris: Secondary | ICD-10-CM | POA: Diagnosis not present

## 2022-09-15 DIAGNOSIS — I10 Essential (primary) hypertension: Secondary | ICD-10-CM | POA: Diagnosis not present

## 2022-09-15 DIAGNOSIS — I5022 Chronic systolic (congestive) heart failure: Secondary | ICD-10-CM | POA: Diagnosis not present

## 2022-09-15 NOTE — Progress Notes (Signed)
Cardiology Office Note:    Date:  09/15/2022   ID:  [name] Young., DOB 03-24-1941, MRN [id number]  PCP:  [name] Young, [place] Prov[facility] Cardiologist:  [name] Bush, MD     Referring MD: [name] Bush, MD   Chief Complaint  Patient presents with   Other    OD Early nov. F/u no complaints today pt would like to f/u with Dr. Saunders Young in the future. Meds reviewed verbally with pt.    History of Present Illness:    [name] Young is a 82 y.o. male with history of three-vessel CAD status post CABG (12/979) complicated by postoperative atrial fibrillation (transient atrial fibrillation recurred in the setting of sepsis related to a sending cholangitis), acute HFrEF (LVEF 40-45%), and mitral valve endocarditis (s/p IV abx), as well as hypertension, hyperlipidemia, type 2 diabetes mellitus, and chronic kidney disease stage III, who presents for follow-up of coronary artery disease, cardiomyopathy, and endocarditis.    Last seen in the office with Dr. Saunders Young 03/2022, at which time he complained of continued fatigue and generalized weakness, particularly in his right leg.  There was concern that he may have had a stroke in the setting of his endocarditis.  MRI of the brain discussed for further evaluation, which [name] Young declined due to claustrophobia. Carotid Dopplers showed only mild right ICA disease.  He underwent successful cholecystectomy with Dr. Christian Young in September.  He presents today accompanied by his wife for a follow up. He is "no worse and no better" than when he was last seen with Dr. Saunders Young. He is able to go to his shop, feed squirrels, went on one bear hunting trip. He is concerned with his leg weakness and a cough. States he slipped while stepping on the running boards getting into his truck and skinned his shin. He denies any falls or dizziness, just states his legs feel weak. We discussed following up with his PCP for further possible testing, however he  is not interested. He has had a cough for ~ 5 weeks, occasionally he is able to get some white phlegm up, but mostly it is dry. He has not followed up with his PCP regarding this, even though his wife has asked him to on several occasions. He denies fever, chills, or weight loss. He denies chest pain, palpitations, dyspnea, pnd, orthopnea, n, v, dizziness, syncope, edema, weight gain, or early satiety. Denies hematochezia, hemoptysis, or hematuria.    Past Medical History:  Diagnosis Date   Adenomatous colon polyp    Aortic atherosclerosis (HCC)    Arthritis    Ascending aorta dilatation ([place])    a.) TTE 11/23/2017: asc Ao measured 37 mm; b.) TTE 10/23/2021: Ao root measured 41 mm, asc Ao measured 38 mm; c.) TEE 10/28/2021: asc Ao measured 38 mm; d.) TTE 01/11/2022: Ao root 40 mm, asc Ao 39 mm   Ascending cholangitis 10/2021   BPH (benign prostatic hyperplasia)    Cardiomyopathy (HCC)    CKD (chronic kidney disease), stage III (HCC)    Claustrophobia    Coronary artery disease    a.) LHC 12/21/2017: 75% mLAD, 90% D1, 80-99% OM1/2, CTO pRCA (L-R collaterals) --> CVTS consult. 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   In setting of bacteremia from ascending colangitis   GERD (gastroesophageal reflux disease)    HFrEF (heart failure with reduced ejection fraction) ([place])    a.) TTE 11/23/2017:  EF 50-55%, mod MAC, triv TR, G1DD; b.) TTE 10/23/2021: EF 40-45%, mild LAE, Ao sclerosis, triv MR, G2DD; c.) TEE 10/28/2021: EF 40-45%, glob HK, mobile vegitation on MV; d.) TTE 01/11/2022: EF 50-55%, mild LVH, RVE, Ao sclerosis, mild MR/AR, G1DD   History of cholelithiasis    History of kidney stones    ca ox [name] @ [facility]) now [employer]   History of pneumonia    HLD (hyperlipidemia)    HTN (hypertension)    Jaundice    age 36   Long term current use of anticoagulant    a.) apixaban   Paroxysmal atrial fibrillation ([place])    a.) CHA2DS2VASc  = 6 (age x 2, HFrEF, HTN, vascular disease history, T2DM);  b.) rate/rhythm maintained on oral metoprolol succinate; chronically anticoagulated with apixaban   Pneumonia    Right-sided carotid artery disease ([place])    a.) carotid doppler 04/05/2022: 1-39% RICA   S/P CABG x 3    a.) LIMA-LAD, SVG-diagonal, SVG-PL branch of RCA   S/P cataract extraction and insertion of intraocular lens    T2DM (type 2 diabetes mellitus) ([place]) 2010    Past Surgical History:  Procedure Laterality Date   CARPAL TUNNEL RELEASE Bilateral    CATARACT EXTRACTION, BILATERAL     COLONOSCOPY  11/2012   11 adenomatous polyps, diverticulosis, rec rpt 1 yr Ardis Hughs)   COLONOSCOPY  12/2013   3 polyps, diverticulosis, rec rpt 3 yrs Ardis Hughs)   COLONOSCOPY  06/2019   6 polyps (TA), diverticulosis, f/u left open ended Ardis Hughs)   CORONARY ARTERY BYPASS GRAFT N/A 01/18/2018   Procedure: CORONARY ARTERY BYPASS GRAFTING (CABG) x 3; Using Left Internal Mammary Artery, and Right Greater Saphenous Vein harvested Endoscopically, Coronary Artery Endarterectomy;  Surgeon: Ivin Poot, MD;  Location: [facility];  Service: Open Heart Surgery;  Laterality: N/A;   ERCP N/A 10/23/2021   Procedure: ENDOSCOPIC RETROGRADE CHOLANGIOPANCREATOGRAPHY (ERCP);  Surgeon: Ladene Artist, MD;  Location: [facility] ENDOSCOPY;  Service: Endoscopy;  Laterality: N/A;   KNEE CARTILAGE SURGERY Left    LEFT HEART CATH AND CORONARY ANGIOGRAPHY N/A 12/21/2017   Procedure: LEFT HEART CATH AND CORONARY ANGIOGRAPHY;  Surgeon: [name] Bush, MD;  Location: [place] [facility] CV LAB;  Service: Cardiovascular;  Laterality: N/A;   LITHOTRIPSY     REMOVAL OF STONES  10/23/2021   Procedure: REMOVAL OF STONES;  Surgeon: Ladene Artist, MD;  Location: [facility] ENDOSCOPY;  Service: Endoscopy;;   SPHINCTEROTOMY  10/23/2021   Procedure: Joan Mayans;  Surgeon: Ladene Artist, MD;  Location: [place];  Service: Endoscopy;;   TEE WITHOUT CARDIOVERSION N/A 01/18/2018   Procedure:  TRANSESOPHAGEAL ECHOCARDIOGRAM (TEE);  Surgeon: Prescott Gum, Collier Salina, MD;  Location: [place];  Service: Open Heart Surgery;  Laterality: N/A;   TEE WITHOUT CARDIOVERSION N/A 10/28/2021   Procedure: TRANSESOPHAGEAL ECHOCARDIOGRAM (TEE);  Surgeon: Pixie Casino, MD;  Location: [facility] ENDOSCOPY;  Service: Cardiovascular;  Laterality: N/A;   UMBILICAL HERNIA REPAIR     with mesh    Current Medications: Current Meds  Medication Sig   ACCU-CHEK AVIVA PLUS test strip USE AS DIRECTED TO CHECK BLOOD SUGARS UPTO FOUR TIMES DAILY.   apixaban (ELIQUIS) 5 MG TABS tablet TAKE 1 TABLET BY MOUTH TWICE A DAY   blood glucose meter kit and supplies Dispense based on patient and insurance preference. Use up to four times daily as directed. (FOR ICD-10 E10.9, E11.9).   Cholecalciferol (VITAMIN D3) 25 MCG (1000 UT) CAPS Take 1 capsule (1,000 Units total) by mouth  daily.   Cyanocobalamin (B-12) 1000 MCG CAPS Take by mouth daily at 2 am.   ENTRESTO 24-26 MG TAKE 1 TABLET BY MOUTH TWICE A DAY   FARXIGA 5 MG TABS tablet TAKE 1 TABLET BY MOUTH EVERY DAY   fenofibrate (TRICOR) 145 MG tablet TAKE 1 TABLET BY MOUTH EVERY DAY   metFORMIN (GLUCOPHAGE) 500 MG tablet TAKE 1 TABLET BY MOUTH EVERY DAY WITH BREAKFAST   metoprolol succinate (TOPROL-XL) 50 MG 24 hr tablet Take 1 tablet (50 mg total) by mouth daily. Take with or immediately following a meal.   Multiple Vitamins-Minerals (MACULAR VITAMIN BENEFIT PO) Take 1 tablet by mouth daily.   nitroGLYCERIN (NITROSTAT) 0.4 MG SL tablet Place 1 tablet (0.4 mg total) under the tongue every 5 (five) minutes as needed for chest pain.   Omega-3 Fatty Acids (FISH OIL) 1200 MG CAPS Take 2 capsules (2,400 mg total) by mouth daily.   omeprazole (PRILOSEC) 40 MG capsule TAKE 1 CAPSULE BY MOUTH EVERY MONDAY,WEDNESDAY, AND FRIDAY   rosuvastatin (CRESTOR) 10 MG tablet TAKE 1 TABLET BY MOUTH EVERY DAY   tamsulosin (FLOMAX) 0.4 MG CAPS capsule TAKE 1 CAPSULE BY MOUTH EVERY DAY     Allergies:    Patient has no known allergies.   Social History   Socioeconomic History   Marital status: Married    Spouse name: Not on file   Number of children: Not on file   Years of education: Not on file   Highest education level: Not on file  Occupational History   Not on file  Tobacco Use   Smoking status: Never    Passive exposure: Past   Smokeless tobacco: Former    Types: Chew    Quit date: 09/08/2001  Vaping Use   Vaping Use: Never used  Substance and Sexual Activity   Alcohol use: Not Currently    Comment: occ   Drug use: No   Sexual activity: Not on file  Other Topics Concern   Not on file  Social History Narrative   Caffeine: occasional Lives with wife, [name] 2009)Occupation: retired, 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.Diet: some water, fruits/vegetables daily, avoids potatoes   Social Determinants of Health   Financial Resource Strain: Not on file  Food Insecurity: No Food Insecurity (06/01/2022)   Hunger Vital Sign    Worried About Running Out of Food in the Last Year: Never true    Ran Out of Food in the Last Year: Never true  Transportation Needs: No Transportation Needs (06/01/2022)   PRAPARE - Hydrologist (Medical): No    Lack of Transportation (Non-Medical): No  Physical Activity: Not on file  Stress: Not on file  Social Connections: Not on file     Family History: The patient's family history includes Alzheimer's disease in his mother; Atrial fibrillation in his mother; Breast cancer in his mother; CAD in his father and mother; Colon cancer (age of onset: 77) in his maternal grandfather; Colon polyps in his brother and sister; Diabetes in his father; Rectal cancer in his maternal grandfather; Stroke (age of onset: 68) in his father. There is no history of Esophageal cancer or Stomach cancer.  ROS:   Review of Systems  Constitutional:  Positive for malaise/fatigue (baseline for the last ~ 9   months).  HENT: Negative.    Eyes: Negative.   Respiratory:  Positive for cough and sputum production. Negative for hemoptysis, shortness of breath and wheezing.  Cardiovascular:  Negative for chest pain, palpitations, orthopnea, claudication, leg swelling and PND.  Gastrointestinal: Negative.  Negative for blood in stool and melena.  Genitourinary:  Negative for hematuria.  Musculoskeletal: Negative.   Skin: Negative.   Neurological:  Negative for dizziness, tingling and loss of consciousness.  Endo/Heme/Allergies: Negative.   Psychiatric/Behavioral: Negative.       EKGs/Labs/Other Studies Reviewed:    The following studies were reviewed today:  03/16/22 Carotid duplex - Mild stenosis noted in the right internal carotid artery.  No significant disease noted in the left carotid system, vertebral arteries, or subclavian arteries.  01/11/22 echo complete - (improved from prior) LV 50-55%, no RWMA, mild LVH, grade I DD, RV mildly enlarged, calcifications/mass noted on both MV leaflets c/w prior endocarditis, mild MVR, mild AVR, aortic dilation noted (40 mm).   10/28/21 echo TEE - LV 40-45%, global hypokinesis, Diffuse thickening and increased echogenicity of the anterior mitral leaflet with small mobile vegetation, consistent with endocarditis. The mitral valve is abnormal. Mild mitral valve regurgitation. Small vegetation on the mitral valve. Aortic dilatation noted. There is borderline dilatation of the ascending aorta, measuring 38 mm     EKG:  EKG is ordered today.  The ekg ordered today demonstrates sinus rhythm with PACs.   Recent Labs: 10/30/2021: B Natriuretic Peptide 394.5 11/01/2021: Magnesium 2.0 06/20/2022: ALT 13; BUN 24; Creatinine, Ser 1.27; Hemoglobin 11.1; Platelets 466.0; Potassium 4.5; Sodium 139  Recent Lipid Panel    Component Value Date/Time   CHOL 76 06/20/2022 0729   CHOL 153 07/11/2018 0759   CHOL 178 02/01/2010 0000   TRIG 133.0 06/20/2022 0729   TRIG 812  02/01/2010 0000   HDL 19.50 (L) 06/20/2022 0729   HDL 34 (L) 07/11/2018 0759   CHOLHDL 4 06/20/2022 0729   VLDL 26.6 06/20/2022 0729   LDLCALC 29 06/20/2022 0729   LDLCALC 79 07/11/2018 0759   LDLDIRECT 66.0 06/07/2021 1228     Risk Assessment/Calculations:                Physical Exam:    VS:  BP 138/64 (BP Location: Left Arm, Patient Position: Sitting, Cuff Size: Large)   Pulse 69   Ht _0  (1.778 m)   Wt 234 lb 8 oz (106.4 kg)   SpO2 98%   BMI 33.65 kg/m     Wt Readings from Last 3 Encounters:  09/15/22 234 lb 8 oz (106.4 kg)  06/27/22 236 lb (107 kg)  06/27/22 238 lb (108 kg)     GEN:  Well nourished, well developed in no acute distress HEENT: Normal NECK: No JVD; No carotid bruits LYMPHATICS: No lymphadenopathy CARDIAC: RRR, no murmurs, rubs, gallops RESPIRATORY:  Clear to auscultation without rales, wheezing or rhonchi  ABDOMEN: Soft, non-tender, non-distended MUSCULOSKELETAL:  No edema; No deformity  SKIN: Warm and dry NEUROLOGIC:  Alert and oriented x 3 PSYCHIATRIC:  Normal affect   ASSESSMENT:    1. Coronary artery disease involving native coronary artery of native heart without angina pectoris   2. Chronic HFrEF (heart failure with reduced ejection fraction) (HCC)   3. Paroxysmal atrial fibrillation ([place])   4. Essential hypertension   5. Hyperlipidemia associated with type 2 diabetes mellitus ([place])   6. Infective endocarditis of mitral valve   7. Aortic root dilation (HCC)   8. Cough, unspecified type    PLAN:    In order of problems listed above:  CAD - No angina or acute decompensation. Currently on Eliquis instead of ASA  for paroxysmal AF.  Chronic HFrEF - Recent EF 50-55%. Euvolemic today. NYHA II - III, this seems to be his current baseline. Continue metoprolol and entresto. On Farxiga (5 mg for DM per PCP). He is essentially unchanged with his HF symptoms, consideration of spironolactone but he and his wife do not really want to add  anything else to his medication regimen at this time. Paroxysmal AF - SR with PAC's on EKG today, denies palpitations. Continue Eliquis 5 mg BID (no indication for dose reduction), continue metoprolol succinate 50 mg daily.  CHA2DS2-VASc Score = 6 [CHF History: 1, HTN History: 1, Diabetes History: 1, Stroke History: 0, Vascular Disease History: 1, Age Score: 2, Gender Score: 0].  Therefore, the patient's annual risk of stroke is 9.7 %.     Essential HTN - BP today 138/64, controlled. Continue metoprolol, Entresto.  Infective endocarditis of MV - s/p 6 weeks of IV antibiotics (completed 12/05/21). Follow up echo complete 01/11/22 - (improved from prior) LV 50-55%, no RWMA, mild LVH, grade I DD, RV mildly enlarged, calcifications/mass noted on both MV leaflets c/w prior endocarditis, mild MVR, mild AVR, aortic dilation noted (40 mm).  HLD - LDL on 06/20/22 29, well controlled. Continue Crestor 10 mg daily, continue fenofibrate 145 mg daily.  Aortic root dilation - Stable on recent TEE 10/2021 (38 mm) Cough - States it has been present for ~ 5 weeks, productive at times, he feels it is coming from his stomach. LS are clear and equal. SPO2 98% on RA. No PND or orthopnea. He has been drinking             Medication Adjustments/Labs and Tests Ordered: Current medicines are reviewed at length with the patient today.  Concerns regarding medicines are outlined above.  Orders Placed This Encounter  Procedures   EKG 12-Lead   No orders of the defined types were placed in this encounter.   Patient Instructions  Please call your primary care provider regarding your persistent cough.   Medication Instructions:  No changes at this time.   *If you need a refill on your cardiac medications before your next appointment, please call your pharmacy*   Lab Work: None  If you have labs (blood work) drawn today and your tests are completely normal, you will receive your results only by: [place] (if  you have MyChart) OR A paper copy in the mail If you have any lab test that is abnormal or we need to change your treatment, we will call you to review the results.   Testing/Procedures: None   Follow-Up: At [facility], you and your health needs are our priority.  As part of our continuing mission to provide you with exceptional heart care, we have created designated Provider Care Teams.  These Care Teams include your primary Cardiologist (physician) and Advanced Practice Providers (APPs -  Physician Assistants and Nurse Practitioners) who all work together to provide you with the care you need, when you need it.   Your next appointment:   6 month(s)  The format for your next appointment:   In Person  Provider:   Nelva Bush, MD      Important Information About Sugar         Signed, Trudi Ida, NP  09/15/2022 12:48 PM    [place]

## 2022-09-15 NOTE — Patient Instructions (Signed)
Please call your primary care provider regarding your persistent cough.   Medication Instructions:  No changes at this time.   *If you need a refill on your cardiac medications before your next appointment, please call your pharmacy*   Lab Work: None  If you have labs (blood work) drawn today and your tests are completely normal, you will receive your results only by: [name] Young (if you have MyChart) OR A paper copy in the mail If you have any lab test that is abnormal or we need to change your treatment, we will call you to review the results.   Testing/Procedures: None   Follow-Up: At [facility], you and your health needs are our priority.  As part of our continuing mission to provide you with exceptional heart care, we have created designated Provider Care Teams.  These Care Teams include your primary Cardiologist (physician) and Advanced Practice Providers (APPs -  Physician Assistants and Nurse Practitioners) who all work together to provide you with the care you need, when you need it.   Your next appointment:   6 month(s)  The format for your next appointment:   In Person  Provider:   Nelva Bush, MD      Important Information About Sugar

## 2022-09-17 ENCOUNTER — Other Ambulatory Visit: Payer: Self-pay | Admitting: Family Medicine

## 2022-09-17 DIAGNOSIS — N400 Enlarged prostate without lower urinary tract symptoms: Secondary | ICD-10-CM

## 2022-09-17 DIAGNOSIS — E1169 Type 2 diabetes mellitus with other specified complication: Secondary | ICD-10-CM

## 2022-09-21 ENCOUNTER — Ambulatory Visit: Payer: Medicare PPO | Admitting: Podiatry

## 2022-09-21 ENCOUNTER — Encounter: Payer: Self-pay | Admitting: Podiatry

## 2022-09-21 VITALS — BP 174/73

## 2022-09-21 DIAGNOSIS — B351 Tinea unguium: Secondary | ICD-10-CM

## 2022-09-21 DIAGNOSIS — M79675 Pain in left toe(s): Secondary | ICD-10-CM | POA: Diagnosis not present

## 2022-09-21 DIAGNOSIS — E1122 Type 2 diabetes mellitus with diabetic chronic kidney disease: Secondary | ICD-10-CM

## 2022-09-21 DIAGNOSIS — E1142 Type 2 diabetes mellitus with diabetic polyneuropathy: Secondary | ICD-10-CM

## 2022-09-21 DIAGNOSIS — M79674 Pain in right toe(s): Secondary | ICD-10-CM | POA: Diagnosis not present

## 2022-09-21 DIAGNOSIS — N183 Chronic kidney disease, stage 3 unspecified: Secondary | ICD-10-CM

## 2022-09-21 NOTE — Progress Notes (Signed)
This patient returns to my office for at risk foot care.  This patient requires this care by a professional since this patient will be at risk due to having diabetres and neuropathy.  Patient presents to the office with her husband.  This patient is unable to cut nails himself since the patient cannot reach his nails.These nails are painful walking and wearing shoes.  This patient presents for at risk foot care today.  General Appearance  Alert, conversant and in no acute stress.  Vascular  Dorsalis pedis and posterior tibial  pulses are weakly  palpable  bilaterally.  Capillary return is within normal limits  bilaterally. Cold feet  Bilaterally. Absent digital hair.  Neurologic  Senn-Weinstein monofilament wire test absent  bilaterally. Muscle power within normal limits bilaterally.  Nails Thick disfigured discolored nails with subungual debris  from hallux to fifth toes bilaterally. No evidence of bacterial infection or drainage bilaterally.  Orthopedic  No limitations of motion  feet .  No crepitus or effusions noted.  No bony pathology or digital deformities noted.  Skin  normotropic skin with no porokeratosis noted bilaterally.  No signs of infections or ulcers noted.     Onychomycosis  Pain in right toes  Pain in left toes  Consent was obtained for treatment procedures.   Mechanical debridement of nails 1-5  bilaterally performed with a nail nipper.  Filed with dremel without incident.    Return office visit   12 weeks                 Told patient to return for periodic foot care and evaluation due to potential at risk complications.   Gardiner Barefoot DPM

## 2022-09-23 ENCOUNTER — Other Ambulatory Visit: Payer: Self-pay | Admitting: Internal Medicine

## 2022-09-24 ENCOUNTER — Other Ambulatory Visit: Payer: Self-pay | Admitting: Internal Medicine

## 2022-09-26 ENCOUNTER — Telehealth: Payer: Self-pay

## 2022-09-26 ENCOUNTER — Telehealth: Payer: Self-pay | Admitting: Internal Medicine

## 2022-09-26 MED ORDER — DAPAGLIFLOZIN PROPANEDIOL 5 MG PO TABS: 5.0000 mg | ORAL_TABLET | Freq: Every day | ORAL | 2 refills | Status: DC

## 2022-09-26 NOTE — Telephone Encounter (Signed)
*  STAT* If patient is at the pharmacy, call can be transferred to refill team.   1. Which medications need to be refilled? (please list name of each medication and dose if known)   FARXIGA 5 MG TABS tablet    2. Which pharmacy/location (including street and city if local pharmacy) is medication to be sent to?  [id number]   3. Do they need a 30 day or 90 day supply? 30 day supply

## 2022-09-26 NOTE — Patient Outreach (Signed)
  Care Coordination   Initial Visit Note   09/26/2022 Name: [name] Young. MRN: [id number] DOB: November 10, 1940  [name] Young. is a 82 y.o. year old male who sees Ria Bush, MD for primary care. I  spouse, Hayze Gazda  What matters to the patients health and wellness today?  Wife states patient has no nursing or community resource needs at this time.     Goals Addressed             This Visit's Progress    Care coordination activities - no follow up needed       Care coordination activities Care coordination program/ services discussed  Social determinants of health survey completed Patient advised to contact primary care provider office  if care coordination services needed in the future         SDOH assessments and interventions completed:  Yes  SDOH Interventions Today    Flowsheet Row Most Recent Value  SDOH Interventions   Food Insecurity Interventions Intervention Not Indicated  Housing Interventions Intervention Not Indicated  Transportation Interventions Intervention Not Indicated        Care Coordination Interventions:  Yes, provided   Follow up plan: No further intervention required.   Encounter Outcome:  Pt. Visit Completed   Quinn Plowman RN,BSN,CCM [place] [phone] direct line

## 2022-09-26 NOTE — Telephone Encounter (Signed)
Please contact patient for overdue follow up with Dr. Saunders Revel.  Refill request pending appt.  Thank you.

## 2022-09-28 DIAGNOSIS — Z961 Presence of intraocular lens: Secondary | ICD-10-CM | POA: Diagnosis not present

## 2022-09-28 DIAGNOSIS — E119 Type 2 diabetes mellitus without complications: Secondary | ICD-10-CM | POA: Diagnosis not present

## 2022-09-28 DIAGNOSIS — H353131 Nonexudative age-related macular degeneration, bilateral, early dry stage: Secondary | ICD-10-CM | POA: Diagnosis not present

## 2022-09-28 LAB — HM DIABETES EYE EXAM

## 2022-10-03 ENCOUNTER — Encounter: Payer: Self-pay | Admitting: Family Medicine

## 2022-10-03 ENCOUNTER — Encounter: Payer: Self-pay | Admitting: Primary Care

## 2022-10-03 ENCOUNTER — Ambulatory Visit: Payer: Medicare PPO | Admitting: Primary Care

## 2022-10-03 VITALS — BP 128/88 | HR 62 | Temp 97.5°F | Ht 70.0 in | Wt 235.0 lb

## 2022-10-03 DIAGNOSIS — R052 Subacute cough: Secondary | ICD-10-CM | POA: Insufficient documentation

## 2022-10-03 DIAGNOSIS — R053 Chronic cough: Secondary | ICD-10-CM | POA: Diagnosis not present

## 2022-10-03 MED ORDER — AZITHROMYCIN 250 MG PO TABS: ORAL_TABLET | ORAL | 0 refills | Status: AC

## 2022-10-03 MED ORDER — BENZONATATE 200 MG PO CAPS: 200.0000 mg | ORAL_CAPSULE | Freq: Three times a day (TID) | ORAL | 0 refills | Status: DC | PRN

## 2022-10-03 NOTE — Assessment & Plan Note (Addendum)
No red flag symptoms.  Differentials include acute bronchitis, community acquired pneumonia, CHF. Reviewed chest xray from August 2023.  Given presentation on physical exam and duration of symptoms, will go ahead and treat for presumed bacterial cause.  Prescription sent for Azithromycin 250 mg two tablets to take by mouth on day 1 and one tablet by mouth from days 2-5.   Prescription sent for Benzonatate 200 mg take one tablet by mouth three times a day as needed.  Recommend follow up with Dr. Danise Mina if symptoms do not improve or worsen.  I evaluated patient, was consulted regarding treatment, and agree with assessment and plan per Tinnie Gens, RN, DNP student.   Allie Bossier, NP-C

## 2022-10-03 NOTE — Progress Notes (Signed)
Subjective:    Patient ID: [name] Young., male    DOB: 05/21/1941, 82 y.o.   MRN: [id number]  Cough Pertinent negatives include no chills, fever, postnasal drip, sore throat or wheezing.    [name] Young. is a very pleasant 82 y.o. male patient of Dr. Danise Mina with a history of hypertension, CAD, CHF, atrial fibrillation, type 2 diabetes, streptococcus bacteremia, e coli bacteremia who presents today to discuss cough.   Symptom onset five weeks ago with dry cough. He then began to develop a productive with clear sputum and has noticed persistent cough and fatigue since. Two days ago he began to experience nasal congestion.   He denies chest pain, wheezing, lower extremity edema, shortness of breath, sore throat, abdominal distention. He's taken Mucinex and is drinking whiskey each night with little improvement. Last dose of Mucinex was one week ago. His wife mentions that this is not his norm.   He is a former smoker, quit in 2002. He underwent chest xray in August 2023 which was negative for pulmonary nodules. He did have mild cardiomegaly.   BP Readings from Last 3 Encounters:  10/03/22 128/88  09/21/22 (!) 174/73  09/15/22 138/64      Review of Systems  Constitutional:  Positive for fatigue. Negative for chills and fever.  HENT:  Positive for congestion. Negative for postnasal drip and sore throat.   Respiratory:  Positive for cough. Negative for wheezing.   Cardiovascular:  Negative for leg swelling.         Past Medical History:  Diagnosis Date   Adenomatous colon polyp    Aortic atherosclerosis (HCC)    Arthritis    Ascending aorta dilatation ([place])    a.) TTE 11/23/2017: asc Ao measured 37 mm; b.) TTE 10/23/2021: Ao root measured 41 mm, asc Ao measured 38 mm; c.) TEE 10/28/2021: asc Ao measured 38 mm; d.) TTE 01/11/2022: Ao root 40 mm, asc Ao 39 mm   Ascending cholangitis 10/2021   BPH (benign prostatic hyperplasia)    Cardiomyopathy (HCC)    CKD  (chronic kidney disease), stage III (HCC)    Claustrophobia    Coronary artery disease    a.) LHC 12/21/2017: 75% mLAD, 90% D1, 80-99% OM1/2, CTO pRCA (L-R collaterals) --> CVTS consult. 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   In setting of bacteremia from ascending colangitis   GERD (gastroesophageal reflux disease)    HFrEF (heart failure with reduced ejection fraction) ([place])    a.) TTE 11/23/2017: EF 50-55%, mod MAC, triv TR, G1DD; b.) TTE 10/23/2021: EF 40-45%, mild LAE, Ao sclerosis, triv MR, G2DD; c.) TEE 10/28/2021: EF 40-45%, glob HK, mobile vegitation on MV; d.) TTE 01/11/2022: EF 50-55%, mild LVH, RVE, Ao sclerosis, mild MR/AR, G1DD   History of cholelithiasis    History of kidney stones    ca ox [name] @ [facility]) now [employer]   History of pneumonia    HLD (hyperlipidemia)    HTN (hypertension)    Jaundice    age 60   Long term current use of anticoagulant    a.) apixaban   Paroxysmal atrial fibrillation ([place])    a.) CHA2DS2VASc = 6 (age x 2, HFrEF, HTN, vascular disease history, T2DM);  b.) rate/rhythm maintained on oral metoprolol succinate; chronically anticoagulated with apixaban   Pneumonia    Right-sided carotid artery disease ([place])    a.) carotid doppler 04/05/2022: 1-39% RICA  S/P CABG x 3    a.) LIMA-LAD, SVG-diagonal, SVG-PL branch of RCA   S/P cataract extraction and insertion of intraocular lens    T2DM (type 2 diabetes mellitus) ([place]) 2010    Social History   Socioeconomic History   Marital status: Married    Spouse name: Not on file   Number of children: Not on file   Years of education: Not on file   Highest education level: Not on file  Occupational History   Not on file  Tobacco Use   Smoking status: Never    Passive exposure: Past   Smokeless tobacco: Former    Types: Chew    Quit date: 09/08/2001  Vaping Use   Vaping Use: Never used  Substance and Sexual Activity   Alcohol use: Not  Currently    Comment: occ   Drug use: No   Sexual activity: Not on file  Other Topics Concern   Not on file  Social History Narrative   Caffeine: occasional Lives with wife, [name] 2009)Occupation: retired, 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.Diet: some water, fruits/vegetables daily, avoids potatoes   Social Determinants of Health   Financial Resource Strain: Not on file  Food Insecurity: No Food Insecurity (09/26/2022)   Hunger Vital Sign    Worried About Running Out of Food in the Last Year: Never true    Ran Out of Food in the Last Year: Never true  Transportation Needs: No Transportation Needs (09/26/2022)   PRAPARE - Hydrologist (Medical): No    Lack of Transportation (Non-Medical): No  Physical Activity: Not on file  Stress: Not on file  Social Connections: Not on file  Intimate Partner Violence: Not on file    Past Surgical History:  Procedure Laterality Date   CARPAL TUNNEL RELEASE Bilateral    CATARACT EXTRACTION, BILATERAL     COLONOSCOPY  11/2012   12 adenomatous polyps, diverticulosis, rec rpt 1 yr Ardis Hughs)   COLONOSCOPY  12/2013   3 polyps, diverticulosis, rec rpt 3 yrs Ardis Hughs)   COLONOSCOPY  06/2019   6 polyps (TA), diverticulosis, f/u left open ended Ardis Hughs)   CORONARY ARTERY BYPASS GRAFT N/A 01/18/2018   Procedure: CORONARY ARTERY BYPASS GRAFTING (CABG) x 3; Using Left Internal Mammary Artery, and Right Greater Saphenous Vein harvested Endoscopically, Coronary Artery Endarterectomy;  Surgeon: Ivin Poot, MD;  Location: [facility];  Service: Open Heart Surgery;  Laterality: N/A;   ERCP N/A 10/23/2021   Procedure: ENDOSCOPIC RETROGRADE CHOLANGIOPANCREATOGRAPHY (ERCP);  Surgeon: Ladene Artist, MD;  Location: [facility] ENDOSCOPY;  Service: Endoscopy;  Laterality: N/A;   KNEE CARTILAGE SURGERY Left    LEFT HEART CATH AND CORONARY ANGIOGRAPHY N/A 12/21/2017   Procedure: LEFT HEART CATH AND CORONARY ANGIOGRAPHY;   Surgeon: Nelva Bush, MD;  Location: [place] CV LAB;  Service: Cardiovascular;  Laterality: N/A;   LITHOTRIPSY     REMOVAL OF STONES  10/23/2021   Procedure: REMOVAL OF STONES;  Surgeon: Ladene Artist, MD;  Location[facility] ENDOSCOPY;  Service: Endoscopy;;   SPHINCTEROTOMY  10/23/2021   Procedure: Joan Mayans;  Surgeon: Ladene Artist, MD;  Location: [place];  Service: Endoscopy;;   TEE WITHOUT CARDIOVERSION N/A 01/18/2018   Procedure: TRANSESOPHAGEAL ECHOCARDIOGRAM (TEE);  Surgeon: Prescott Gum, Collier Salina, MD;  Location: [place];  Service: Open Heart Surgery;  Laterality: N/A;   TEE WITHOUT CARDIOVERSION N/A 10/28/2021   Procedure: TRANSESOPHAGEAL ECHOCARDIOGRAM (TEE);  Surgeon: Pixie Casino, MD;  Location: [facility] ENDOSCOPY;  Service: Cardiovascular;  Laterality: N/A;   UMBILICAL HERNIA REPAIR     with mesh    Family History  Problem Relation Age of Onset   Alzheimer's disease Mother    Breast cancer Mother        breast   Atrial fibrillation Mother    CAD Mother    Stroke Father 10   Diabetes Father    CAD Father    Colon polyps Sister    Colon polyps Brother    Rectal cancer Maternal Grandfather        rectal   Colon cancer Maternal Grandfather 88   Esophageal cancer Neg Hx    Stomach cancer Neg Hx     No Known Allergies  Current Outpatient Medications on File Prior to Visit  Medication Sig Dispense Refill   ACCU-CHEK AVIVA PLUS test strip USE AS DIRECTED TO CHECK BLOOD SUGARS UPTO FOUR TIMES DAILY. 100 each 3   apixaban (ELIQUIS) 5 MG TABS tablet TAKE 1 TABLET BY MOUTH TWICE A DAY 60 tablet 5   blood glucose meter kit and supplies Dispense based on patient and insurance preference. Use up to four times daily as directed. (FOR ICD-10 E10.9, E11.9). 1 each 0   Cholecalciferol (VITAMIN D3) 25 MCG (1000 UT) CAPS Take 1 capsule (1,000 Units total) by mouth daily. 30 capsule    Cyanocobalamin (B-12) 1000 MCG CAPS Take by mouth daily at 2 am.     dapagliflozin propanediol  (FARXIGA) 5 MG TABS tablet Take 1 tablet (5 mg total) by mouth daily. 30 tablet 2   ENTRESTO 24-26 MG TAKE 1 TABLET BY MOUTH TWICE A DAY 60 tablet 4   fenofibrate (TRICOR) 145 MG tablet TAKE 1 TABLET BY MOUTH EVERY DAY 60 tablet 0   metFORMIN (GLUCOPHAGE) 500 MG tablet TAKE 1 TABLET BY MOUTH EVERY DAY WITH BREAKFAST 90 tablet 3   metoprolol succinate (TOPROL-XL) 50 MG 24 hr tablet TAKE 1 TABLET BY MOUTH DAILY. TAKE WITH OR IMMEDIATELY FOLLOWING A MEAL. 90 tablet 1   Multiple Vitamins-Minerals (MACULAR VITAMIN BENEFIT PO) Take 1 tablet by mouth daily.     nitroGLYCERIN (NITROSTAT) 0.4 MG SL tablet Place 1 tablet (0.4 mg total) under the tongue every 5 (five) minutes as needed for chest pain. 25 tablet 4   Omega-3 Fatty Acids (FISH OIL) 1200 MG CAPS Take 2 capsules (2,400 mg total) by mouth daily.     omeprazole (PRILOSEC) 40 MG capsule TAKE 1 CAPSULE BY MOUTH EVERY MONDAY,WEDNESDAY, AND FRIDAY 38 capsule 1   rosuvastatin (CRESTOR) 10 MG tablet TAKE 1 TABLET BY MOUTH EVERY DAY 90 tablet 3   tamsulosin (FLOMAX) 0.4 MG CAPS capsule TAKE 1 CAPSULE BY MOUTH EVERY DAY 90 capsule 3   No current facility-administered medications on file prior to visit.    BP 128/88   Pulse 62   Temp (!) 97.5 F (36.4 C) (Temporal)   Ht _0  (1.778 m)   Wt 235 lb (106.6 kg)   SpO2 98%   BMI 33.72 kg/m  Objective:   Physical Exam Constitutional:      General: He is not in acute distress.    Appearance: He is not ill-appearing.  HENT:     Nose: No mucosal edema.     Right Sinus: No maxillary sinus tenderness or frontal sinus tenderness.     Left Sinus: No maxillary sinus tenderness or frontal sinus tenderness.  Eyes:     Conjunctiva/sclera: Conjunctivae normal.  Cardiovascular:  Rate and Rhythm: Normal rate and regular rhythm.  Pulmonary:     Effort: Pulmonary effort is normal.     Breath sounds: Examination of the left-lower field reveals rhonchi. Rhonchi present. No wheezing or rales.   Musculoskeletal:     Cervical back: Neck supple.  Skin:    General: Skin is warm and dry.           Assessment & Plan:  Persistent cough for 3 weeks or longer Assessment & Plan: No red flag symptoms.  Differentials include acute bronchitis, community acquired pneumonia, CHF. Reviewed chest xray from August 2023.  Given presentation on physical exam and duration of symptoms, will go ahead and treat for presumed bacterial cause.  Prescription sent for Azithromycin 250 mg two tablets to take by mouth on day 1 and one tablet by mouth from days 2-5.   Prescription sent for Benzonatate 200 mg take one tablet by mouth three times a day as needed.  Recommend follow up with Dr. Danise Mina if symptoms do not improve or worsen.  I evaluated patient, was consulted regarding treatment, and agree with assessment and plan per Tinnie Gens, RN, DNP student.   Allie Bossier, NP-C   Orders: -     Benzonatate; Take 1 capsule (200 mg total) by mouth 3 (three) times daily as needed for cough.  Dispense: 15 capsule; Refill: 0 -     Azithromycin; Take 2 tablets by mouth on day 1, then 1 tablet daily on days 2 through 5  Dispense: 6 tablet; Refill: 0        Pleas Koch, NP

## 2022-10-03 NOTE — Progress Notes (Signed)
Established Patient Office Visit  Subjective   Patient ID: [name] Young., male    DOB: 1941/04/29  Age: 82 y.o. MRN: [id number]  Chief Complaint  Patient presents with   Cough    Productive cough x 5 weeks. No other symptoms, except fatigue from coughing all the time    Cough Pertinent negatives include no chest pain, chills, ear pain, fever, headaches, myalgias, sore throat, shortness of breath or wheezing.    [name] Young. Is a 82 year old male, patient of Dr. Danise Mina, with past medical history of hypertension, GERD, type 2 diabetes, chronic HFrEF, former smoker presents today for an acute visit.   Symptoms start five weeks ago with a dry cough then developed a productive cough. Reports his sputum is clear. He has finished two bottles of mucinex with no relief. He feels more tired from continuous coughing. He denies being around anyone who has been sick.  Denies any fever, chills, shortness of breath or chest pain.  Two days ago he started experiencing nasal congestion with runny nose. His last dose of Mucinex was one week ago. He had a chest x-ray in August, 2023 which revealed no active cardiopulmonary disease. Mild cardiomegaly.   Denies any difficulty breathing while laying down or lower extremity swelling.    Patient Active Problem List   Diagnosis Date Noted   Persistent cough for 3 weeks or longer 10/03/2022   Pre-op evaluation 05/03/2022   Liver mass, right lobe 05/03/2022   CCC (chronic calculous cholecystitis) 04/25/2022   History of acute cholangitis 04/25/2022   Hoarseness 12/22/2021   Infective endocarditis of mitral valve 11/12/2021   Palliative care by specialist    Bacteremia due to Escherichia coli 10/24/2021   Bacteremia due to Streptococcus 10/24/2021   Vitamin B12 deficiency 05/24/2020   Vitamin D deficiency 05/24/2020   Early dry stage nonexudative age-related macular degeneration of both eyes 12/01/2019   Pain due to onychomycosis of  toenails of both feet 07/28/2019   Diabetic neuropathy ([place]) 07/28/2019   Hearing loss, right 05/22/2019   Chronic HFrEF (heart failure with reduced ejection fraction) ([place]) 05/24/2018   Atrial fibrillation ([place]) 02/08/2018   Debility 01/28/2018   Anemia    S/P CABG x 3 01/18/2018   Coronary artery disease 12/29/2017   Chronic inactive rheumatic heart disease 11/24/2017   DNR (do not resuscitate) 06/14/2017   Peripheral neuropathy 06/14/2017   Tinea cruris 04/02/2017   Health maintenance examination 12/12/2016   Obesity, Class I, BMI 30-34.9 12/12/2016   Advanced care planning/counseling discussion 10/30/2014   Benign prostatic hyperplasia 10/30/2014   CKD stage 3 due to type 2 diabetes mellitus ([place]) 07/08/2013   Tinea corporis 01/16/2013   Medicare annual wellness visit, subsequent 10/11/2011   Type 2 diabetes mellitus with other specified complication ([place])    Essential hypertension    Hyperlipidemia associated with type 2 diabetes mellitus ([place])    History of kidney stones    GERD (gastroesophageal reflux disease)    Past Medical History:  Diagnosis Date   Adenomatous colon polyp    Aortic atherosclerosis (HCC)    Arthritis    Ascending aorta dilatation ([place])    a.) TTE 11/23/2017: asc Ao measured 37 mm; b.) TTE 10/23/2021: Ao root measured 41 mm, asc Ao measured 38 mm; c.) TEE 10/28/2021: asc Ao measured 38 mm; d.) TTE 01/11/2022: Ao root 40 mm, asc Ao 39 mm   Ascending cholangitis 10/2021   BPH (benign prostatic hyperplasia)  Cardiomyopathy ([place])    CKD (chronic kidney disease), stage III ([place])    Claustrophobia    Coronary artery disease    a.) LHC 12/21/2017: 75% mLAD, 90% D1, 80-99% OM1/2, CTO pRCA (L-R collaterals) --> CVTS consult. 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   In setting of bacteremia from ascending colangitis   GERD (gastroesophageal reflux disease)    HFrEF (heart failure with reduced ejection  fraction) ([place])    a.) TTE 11/23/2017: EF 50-55%, mod MAC, triv TR, G1DD; b.) TTE 10/23/2021: EF 40-45%, mild LAE, Ao sclerosis, triv MR, G2DD; c.) TEE 10/28/2021: EF 40-45%, glob HK, mobile vegitation on MV; d.) TTE 01/11/2022: EF 50-55%, mild LVH, RVE, Ao sclerosis, mild MR/AR, G1DD   History of cholelithiasis    History of kidney stones    ca ox [name] @ [facility]) now [employer]   History of pneumonia    HLD (hyperlipidemia)    HTN (hypertension)    Jaundice    age 66   Long term current use of anticoagulant    a.) apixaban   Paroxysmal atrial fibrillation ([place])    a.) CHA2DS2VASc = 6 (age x 2, HFrEF, HTN, vascular disease history, T2DM);  b.) rate/rhythm maintained on oral metoprolol succinate; chronically anticoagulated with apixaban   Pneumonia    Right-sided carotid artery disease ([place])    a.) carotid doppler 04/05/2022: 1-39% RICA   S/P CABG x 3    a.) LIMA-LAD, SVG-diagonal, SVG-PL branch of RCA   S/P cataract extraction and insertion of intraocular lens    T2DM (type 2 diabetes mellitus) ([place]) 2010   Past Surgical History:  Procedure Laterality Date   CARPAL TUNNEL RELEASE Bilateral    CATARACT EXTRACTION, BILATERAL     COLONOSCOPY  11/2012   12 adenomatous polyps, diverticulosis, rec rpt 1 yr Ardis Hughs)   COLONOSCOPY  12/2013   3 polyps, diverticulosis, rec rpt 3 yrs Ardis Hughs)   COLONOSCOPY  06/2019   6 polyps (TA), diverticulosis, f/u left open ended Ardis Hughs)   CORONARY ARTERY BYPASS GRAFT N/A 01/18/2018   Procedure: CORONARY ARTERY BYPASS GRAFTING (CABG) x 3; Using Left Internal Mammary Artery, and Right Greater Saphenous Vein harvested Endoscopically, Coronary Artery Endarterectomy;  Surgeon: Ivin Poot, MD;  Location: [place];  Service: Open Heart Surgery;  Laterality: N/A;   ERCP N/A 10/23/2021   Procedure: ENDOSCOPIC RETROGRADE CHOLANGIOPANCREATOGRAPHY (ERCP);  Surgeon: Ladene Artist, MD;  Location: [facility] ENDOSCOPY;  Service: Endoscopy;  Laterality: N/A;   KNEE  CARTILAGE SURGERY Left    LEFT HEART CATH AND CORONARY ANGIOGRAPHY N/A 12/21/2017   Procedure: LEFT HEART CATH AND CORONARY ANGIOGRAPHY;  Surgeon: Nelva Bush, MD;  Location: [place] CV LAB;  Service: Cardiovascular;  Laterality: N/A;   LITHOTRIPSY     REMOVAL OF STONES  10/23/2021   Procedure: REMOVAL OF STONES;  Surgeon: Ladene Artist, MD;  Location[facility] ENDOSCOPY;  Service: Endoscopy;;   SPHINCTEROTOMY  10/23/2021   Procedure: Joan Mayans;  Surgeon: Ladene Artist, MD;  Location: [place];  Service: Endoscopy;;   TEE WITHOUT CARDIOVERSION N/A 01/18/2018   Procedure: TRANSESOPHAGEAL ECHOCARDIOGRAM (TEE);  Surgeon: Prescott Gum, Collier Salina, MD;  Location: [place];  Service: Open Heart Surgery;  Laterality: N/A;   TEE WITHOUT CARDIOVERSION N/A 10/28/2021   Procedure: TRANSESOPHAGEAL ECHOCARDIOGRAM (TEE);  Surgeon: Pixie Casino, MD;  Location: [place];  Service: Cardiovascular;  Laterality: N/A;   H. Rivera Colon  with mesh   Social History   Tobacco Use   Smoking status: Never    Passive exposure: Past   Smokeless tobacco: Former    Types: Chew    Quit date: 09/08/2001  Vaping Use   Vaping Use: Never used  Substance Use Topics   Alcohol use: Not Currently    Comment: occ   Drug use: No   Family History  Problem Relation Age of Onset   Alzheimer's disease Mother    Breast cancer Mother        breast   Atrial fibrillation Mother    CAD Mother    Stroke Father 46   Diabetes Father    CAD Father    Colon polyps Sister    Colon polyps Brother    Rectal cancer Maternal Grandfather        rectal   Colon cancer Maternal Grandfather 88   Esophageal cancer Neg Hx    Stomach cancer Neg Hx    No Known Allergies    Review of Systems  Constitutional:  Positive for malaise/fatigue. Negative for chills and fever.  HENT:  Positive for congestion. Negative for ear pain and sore throat.   Respiratory:  Positive for cough and sputum production. Negative for  shortness of breath and wheezing.   Cardiovascular:  Negative for chest pain, orthopnea and leg swelling.  Musculoskeletal:  Negative for myalgias.  Neurological:  Negative for dizziness and headaches.      Objective:     BP 128/88   Pulse 62   Temp (!) 97.5 F (36.4 C) (Temporal)   Ht _0  (1.778 m)   Wt 235 lb (106.6 kg)   SpO2 98%   BMI 33.72 kg/m  BP Readings from Last 3 Encounters:  10/03/22 128/88  09/21/22 (!) 174/73  09/15/22 138/64   Wt Readings from Last 3 Encounters:  10/03/22 235 lb (106.6 kg)  09/15/22 234 lb 8 oz (106.4 kg)  06/27/22 236 lb (107 kg)      Physical Exam Vitals and nursing note reviewed.  HENT:     Right Ear: Tympanic membrane, ear canal and external ear normal.     Left Ear: Tympanic membrane, ear canal and external ear normal.     Nose: Congestion present.     Right Sinus: No maxillary sinus tenderness or frontal sinus tenderness.     Left Sinus: No maxillary sinus tenderness or frontal sinus tenderness.     Mouth/Throat:     Mouth: Mucous membranes are moist.  Cardiovascular:     Rate and Rhythm: Normal rate and regular rhythm.     Pulses: Normal pulses.     Heart sounds: Normal heart sounds.  Pulmonary:     Effort: Pulmonary effort is normal.     Breath sounds: Normal breath sounds.  Neurological:     Mental Status: He is alert and oriented to person, place, and time.  Psychiatric:        Mood and Affect: Mood normal.        Behavior: Behavior normal.      Results for orders placed or performed in visit on 10/03/22  HM DIABETES EYE EXAM  Result Value Ref Range   HM Diabetic Eye Exam No Retinopathy No Retinopathy       The ASCVD Risk score (Arnett DK, et al., 2019) failed to calculate for the following reasons:   The 2019 ASCVD risk score is only valid for ages 32 to 68   The patient has  a prior MI or stroke diagnosis    Assessment & Plan:   Problem List Items Addressed This Visit       Other   Persistent  cough for 3 weeks or longer - Primary    No red flag symptoms.  Differentials include acute bronchitis, community acquired pneumonia  Given presentation on physical exam and duration on symptoms, will go ahead and treat.   Prescription sent for Azithromycin 250 mg two tablets to take by mouth on day 1 and one tablet by mouth from days 2-5.   Prescription sent for Benzonatate 200 mg take one tablet by mouth three times a day as needed.  Recommend follow up with Dr. Danise Mina if symptoms do not improve or worsen.      Relevant Medications   benzonatate (TESSALON) 200 MG capsule   azithromycin (ZITHROMAX) 250 MG tablet    No follow-ups on file.    Tinnie Gens, BSN-RN, DNP STUDENT

## 2022-10-03 NOTE — Patient Instructions (Addendum)
Start Azithromycin. Take two tablets on day one and then 1 tablet for days 2-5.   Tessalon perles - Take one tablet three times a day as needed.   Please follow up with PCP if your symptoms worsen or do not improve.   It was a pleasure to see you today!

## 2022-10-05 ENCOUNTER — Other Ambulatory Visit: Payer: Self-pay | Admitting: Internal Medicine

## 2022-10-11 ENCOUNTER — Ambulatory Visit (INDEPENDENT_AMBULATORY_CARE_PROVIDER_SITE_OTHER)
Admission: RE | Admit: 2022-10-11 | Discharge: 2022-10-11 | Disposition: A | Discharge status: Home / Self Care | Payer: Medicare PPO | Source: Ambulatory Visit | Attending: Family Medicine | Admitting: Family Medicine

## 2022-10-11 ENCOUNTER — Encounter: Payer: Self-pay | Admitting: Family Medicine

## 2022-10-11 ENCOUNTER — Ambulatory Visit: Payer: Medicare PPO | Admitting: Family Medicine

## 2022-10-11 VITALS — BP 120/62 | HR 64 | Temp 97.6°F | Ht 70.0 in | Wt 232.0 lb

## 2022-10-11 DIAGNOSIS — Z951 Presence of aortocoronary bypass graft: Secondary | ICD-10-CM

## 2022-10-11 DIAGNOSIS — R059 Cough, unspecified: Secondary | ICD-10-CM | POA: Diagnosis not present

## 2022-10-11 DIAGNOSIS — J90 Pleural effusion, not elsewhere classified: Secondary | ICD-10-CM | POA: Diagnosis not present

## 2022-10-11 DIAGNOSIS — R053 Chronic cough: Secondary | ICD-10-CM

## 2022-10-11 DIAGNOSIS — I480 Paroxysmal atrial fibrillation: Secondary | ICD-10-CM | POA: Diagnosis not present

## 2022-10-11 DIAGNOSIS — I5022 Chronic systolic (congestive) heart failure: Secondary | ICD-10-CM

## 2022-10-11 NOTE — Progress Notes (Signed)
[name] Young T. [name] Femia, MD, [place] at [facility] [place], [address]  Phone: [phone]  FAX: [place] 82 y.o. male  MRN [id number]  Date of Birth: [birth year]  Date: 10/11/2022  PCP: Ria Bush, MD  Referral: Ria Bush, MD  Chief Complaint  Patient presents with   Cough    Seen [name] on 10/03/22   Subjective:   [name] Young. is a 82 y.o. very pleasant male patient with Body mass index is 33.29 kg/m. who presents with the following:  Pleasant patient of Dr. Danise Mina with a history of coronary disease status post CABG, chronic atrial fibrillation, chronic congestive heart failure who presents with coughing x 5 weeks.  It is a clear, productive cough, and is never having colored sputum.  He will often cough 8-10 times in a row before he produces any phlegm.  He previously was given some Zithromax as well as some Ladona Ridgel on October 03, 2022.  5 weeks or more, he has been coughing a lot.   Runs nose for a couple of weeks He is predominately only having issues coughing.  He has not had any fever, chills, sweats.  No diffuse polyarthralgia.  No myalgia.  No nausea, vomiting, diarrhea.  Never has been a smoker  [phone]  Review of Systems is noted in the HPI, as appropriate  Objective:   BP 120/62   Pulse 64   Temp 97.6 F (36.4 C) (Oral)   Ht '5\' 10"'$  (1.778 m)   Wt 232 lb (105.2 kg)   SpO2 99%   BMI 33.29 kg/m   GEN: No acute distress; alert,appropriate. PULM: Breathing comfortably in no respiratory distress PSYCH: Normally interactive.  CV: RRR, no m/g/r  PULM: Normal respiratory rate, no accessory muscle use. No wheezes, crackles or rhonchi   Laboratory and Imaging Data: DG Chest 2 View  Result Date: 10/11/2022 CLINICAL DATA:  Cough EXAM: CHEST - 2 VIEW COMPARISON:  Chest x-ray 05/02/2022 FINDINGS: Patient is status post cardiac surgery.  The heart is enlarged. There is a small right pleural effusion. Both lungs are clear. The visualized skeletal structures are unremarkable. IMPRESSION: 1. Cardiomegaly. 2. Small right pleural effusion. Electronically Signed   By: Ronney Asters M.D.   On: 10/11/2022 16:06     Assessment and Plan:     ICD-10-CM   1. Pleural effusion on right  J90 Ambulatory referral to Pulmonology    2. Persistent cough for 3 weeks or longer  R05.3 DG Chest 2 View    Ambulatory referral to Pulmonology    3. Chronic HFrEF (heart failure with reduced ejection fraction) (HCC)  I50.22     4. Paroxysmal atrial fibrillation (HCC)  I48.0     5. S/P CABG x 3  Z95.1      He definitely has a pleural effusion on the right side, as well as suspect that this is at least in part causing his chronic cough.  I discussed the case with the patient's primary care doctor, and both of [place] thinks that he needs to have a higher level of care and potentially have this pleural effusion drained.  I have arranged for him to see pulmonology to help address.  I appreciate their help in consult in this matter.  Multiple other medical problems, that he does not seem to be volume overloaded right now, so I would doubt that heart failure is playing a role.  Medication Management during today's office visit: No orders of the defined types were placed in this encounter.  Medications Discontinued During This Encounter  Medication Reason   benzonatate (TESSALON) 200 MG capsule Completed Course    Orders placed today for conditions managed today: Orders Placed This Encounter  Procedures   DG Chest 2 View   Ambulatory referral to Pulmonology    Disposition: No follow-ups on file.  Dragon [employer] One speech-to-text software was used for transcription in this dictation.  Possible transcriptional errors can occur using Editor, commissioning.   Signed,  Maud Deed. Silvana Holecek, MD   Outpatient Encounter Medications as of 10/11/2022  Medication  Sig   ACCU-CHEK AVIVA PLUS test strip USE AS DIRECTED TO CHECK BLOOD SUGARS UPTO FOUR TIMES DAILY.   apixaban (ELIQUIS) 5 MG TABS tablet TAKE 1 TABLET BY MOUTH TWICE A DAY   blood glucose meter kit and supplies Dispense based on patient and insurance preference. Use up to four times daily as directed. (FOR ICD-10 E10.9, E11.9).   Cholecalciferol (VITAMIN D3) 25 MCG (1000 UT) CAPS Take 1 capsule (1,000 Units total) by mouth daily.   Cyanocobalamin (B-12) 1000 MCG CAPS Take by mouth daily at 2 am.   dapagliflozin propanediol (FARXIGA) 5 MG TABS tablet Take 1 tablet (5 mg total) by mouth daily.   ENTRESTO 24-26 MG TAKE 1 TABLET BY MOUTH TWICE A DAY   fenofibrate (TRICOR) 145 MG tablet TAKE 1 TABLET BY MOUTH EVERY DAY   metFORMIN (GLUCOPHAGE) 500 MG tablet TAKE 1 TABLET BY MOUTH EVERY DAY WITH BREAKFAST   metoprolol succinate (TOPROL-XL) 50 MG 24 hr tablet TAKE 1 TABLET BY MOUTH DAILY. TAKE WITH OR IMMEDIATELY FOLLOWING A MEAL.   Multiple Vitamins-Minerals (MACULAR VITAMIN BENEFIT PO) Take 1 tablet by mouth daily.   nitroGLYCERIN (NITROSTAT) 0.4 MG SL tablet Place 1 tablet (0.4 mg total) under the tongue every 5 (five) minutes as needed for chest pain.   Omega-3 Fatty Acids (FISH OIL) 1200 MG CAPS Take 2 capsules (2,400 mg total) by mouth daily.   omeprazole (PRILOSEC) 40 MG capsule TAKE 1 CAPSULE BY MOUTH EVERY MONDAY,WEDNESDAY, AND FRIDAY   rosuvastatin (CRESTOR) 10 MG tablet TAKE 1 TABLET BY MOUTH EVERY DAY   tamsulosin (FLOMAX) 0.4 MG CAPS capsule TAKE 1 CAPSULE BY MOUTH EVERY DAY   [DISCONTINUED] benzonatate (TESSALON) 200 MG capsule Take 1 capsule (200 mg total) by mouth 3 (three) times daily as needed for cough.   No facility-administered encounter medications on file as of 10/11/2022.

## 2022-10-12 ENCOUNTER — Encounter: Payer: Self-pay | Admitting: Pulmonary Disease

## 2022-10-12 ENCOUNTER — Ambulatory Visit: Payer: Medicare PPO | Admitting: Pulmonary Disease

## 2022-10-12 VITALS — BP 118/78 | HR 71 | Ht 70.0 in | Wt 231.2 lb

## 2022-10-12 DIAGNOSIS — R052 Subacute cough: Secondary | ICD-10-CM

## 2022-10-12 DIAGNOSIS — J90 Pleural effusion, not elsewhere classified: Secondary | ICD-10-CM | POA: Diagnosis not present

## 2022-10-12 MED ORDER — PREDNISONE 20 MG PO TABS: 20.0000 mg | ORAL_TABLET | Freq: Every day | ORAL | 0 refills | Status: DC

## 2022-10-12 MED ORDER — DOXYCYCLINE HYCLATE 100 MG PO TABS: 100.0000 mg | ORAL_TABLET | Freq: Two times a day (BID) | ORAL | 0 refills | Status: DC

## 2022-10-12 MED ORDER — HYDROCODONE BIT-HOMATROP MBR 5-1.5 MG/5ML PO SOLN: 5.0000 mL | Freq: Four times a day (QID) | ORAL | 0 refills | Status: DC | PRN

## 2022-10-12 NOTE — Patient Instructions (Signed)
I will see you in about 4 to 5 weeks  Breathing study on the day of next visit  Schedule for ultrasound-guided thoracentesis  Prescription for doxycycline  Prescription for short course of steroids  Cough medicine -Sometimes the pharmacy may not carry it and you may need to asked them if there is another pharmacy to send the prescription to where you can pick it up

## 2022-10-12 NOTE — Progress Notes (Signed)
[name] Young    [id number]    1941/03/29  Primary Care Physician:Gutierrez, Garlon Hatchet, MD  Referring Physician: Owens Loffler, MD [place],  [place] [id number]  Chief complaint:   Patient being seen for subacute cough  HPI:  Has had a cough for about 5 weeks Bringing up clear stringy phlegm Has not had any fevers or chills Has not recently started any new medications that may be responsible for the cough Denies significant nasal stuffiness or congestion  Some chest discomfort with protracted coughing  He feels whenever he is laying on one side he is able to clear secretions better  Was not around anybody with a febrile illness  Remote history of smoking cigars here and there, was never really a smoker  No significant occupational exposure worked outdoors, worked for [employer] for many years  History of hypertension, diabetes, hypercholesterolemia, coronary artery disease for which he had bypass in 2018  Associated with the cough is more shortness of breath, has felt more limited with activities of daily living, decreased usual routine  Coughing that keeps him up at night  Was evaluated in the emergency department/urgent care Recent chest x-ray showing right pleural effusion  Was recently treated with a course of azithromycin that did not really help Benzonatate does not help the cough  Denies any significant leg swelling  Outpatient Encounter Medications as of 10/12/2022  Medication Sig   ACCU-CHEK AVIVA PLUS test strip USE AS DIRECTED TO CHECK BLOOD SUGARS UPTO FOUR TIMES DAILY.   apixaban (ELIQUIS) 5 MG TABS tablet TAKE 1 TABLET BY MOUTH TWICE A DAY   blood glucose meter kit and supplies Dispense based on patient and insurance preference. Use up to four times daily as directed. (FOR ICD-10 E10.9, E11.9).   Cholecalciferol (VITAMIN D3) 25 MCG (1000 UT) CAPS Take 1 capsule (1,000 Units total) by mouth daily.   Cyanocobalamin (B-12)  1000 MCG CAPS Take by mouth daily at 2 am.   dapagliflozin propanediol (FARXIGA) 5 MG TABS tablet Take 1 tablet (5 mg total) by mouth daily.   ENTRESTO 24-26 MG TAKE 1 TABLET BY MOUTH TWICE A DAY   fenofibrate (TRICOR) 145 MG tablet TAKE 1 TABLET BY MOUTH EVERY DAY   metFORMIN (GLUCOPHAGE) 500 MG tablet TAKE 1 TABLET BY MOUTH EVERY DAY WITH BREAKFAST   metoprolol succinate (TOPROL-XL) 50 MG 24 hr tablet TAKE 1 TABLET BY MOUTH DAILY. TAKE WITH OR IMMEDIATELY FOLLOWING A MEAL.   Multiple Vitamins-Minerals (MACULAR VITAMIN BENEFIT PO) Take 1 tablet by mouth daily.   nitroGLYCERIN (NITROSTAT) 0.4 MG SL tablet Place 1 tablet (0.4 mg total) under the tongue every 5 (five) minutes as needed for chest pain.   Omega-3 Fatty Acids (FISH OIL) 1200 MG CAPS Take 2 capsules (2,400 mg total) by mouth daily.   omeprazole (PRILOSEC) 40 MG capsule TAKE 1 CAPSULE BY MOUTH EVERY MONDAY,WEDNESDAY, AND FRIDAY   rosuvastatin (CRESTOR) 10 MG tablet TAKE 1 TABLET BY MOUTH EVERY DAY   tamsulosin (FLOMAX) 0.4 MG CAPS capsule TAKE 1 CAPSULE BY MOUTH EVERY DAY   No facility-administered encounter medications on file as of 10/12/2022.    Allergies as of 10/12/2022   (No Known Allergies)    Past Medical History:  Diagnosis Date   Adenomatous colon polyp    Aortic atherosclerosis (HCC)    Arthritis    Ascending aorta dilatation ([place])    a.) TTE 11/23/2017: asc Ao measured 37 mm; b.)  TTE 10/23/2021: Ao root measured 41 mm, asc Ao measured 38 mm; c.) TEE 10/28/2021: asc Ao measured 38 mm; d.) TTE 01/11/2022: Ao root 40 mm, asc Ao 39 mm   Ascending cholangitis 10/2021   BPH (benign prostatic hyperplasia)    Cardiomyopathy (HCC)    CKD (chronic kidney disease), stage III (HCC)    Claustrophobia    Coronary artery disease    a.) LHC 12/21/2017: 75% mLAD, 90% D1, 80-99% OM1/2, CTO pRCA (L-R collaterals) --> CVTS consult. 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   In  setting of bacteremia from ascending colangitis   GERD (gastroesophageal reflux disease)    HFrEF (heart failure with reduced ejection fraction) ([place])    a.) TTE 11/23/2017: EF 50-55%, mod MAC, triv TR, G1DD; b.) TTE 10/23/2021: EF 40-45%, mild LAE, Ao sclerosis, triv MR, G2DD; c.) TEE 10/28/2021: EF 40-45%, glob HK, mobile vegitation on MV; d.) TTE 01/11/2022: EF 50-55%, mild LVH, RVE, Ao sclerosis, mild MR/AR, G1DD   History of cholelithiasis    History of kidney stones    ca ox [name] @ [employer]) now [employer]   History of pneumonia    HLD (hyperlipidemia)    HTN (hypertension)    Jaundice    age 82   Long term current use of anticoagulant    a.) apixaban   Paroxysmal atrial fibrillation ([place])    a.) CHA2DS2VASc = 6 (age x 2, HFrEF, HTN, vascular disease history, T2DM);  b.) rate/rhythm maintained on oral metoprolol succinate; chronically anticoagulated with apixaban   Pneumonia    Right-sided carotid artery disease ([place])    a.) carotid doppler 04/05/2022: 1-39% RICA   S/P CABG x 3    a.) LIMA-LAD, SVG-diagonal, SVG-PL branch of RCA   S/P cataract extraction and insertion of intraocular lens    T2DM (type 2 diabetes mellitus) ([place]) 2010    Past Surgical History:  Procedure Laterality Date   CARPAL TUNNEL RELEASE Bilateral    CATARACT EXTRACTION, BILATERAL     COLONOSCOPY  11/2012   12 adenomatous polyps, diverticulosis, rec rpt 1 yr Ardis Hughs)   COLONOSCOPY  12/2013   3 polyps, diverticulosis, rec rpt 3 yrs Ardis Hughs)   COLONOSCOPY  06/2019   7 polyps (TA), diverticulosis, f/u left open ended Ardis Hughs)   CORONARY ARTERY BYPASS GRAFT N/A 01/18/2018   Procedure: CORONARY ARTERY BYPASS GRAFTING (CABG) x 3; Using Left Internal Mammary Artery, and Right Greater Saphenous Vein harvested Endoscopically, Coronary Artery Endarterectomy;  Surgeon: Ivin Poot, MD;  Location: [facility];  Service: Open Heart Surgery;  Laterality: N/A;   ERCP N/A 10/23/2021   Procedure: ENDOSCOPIC RETROGRADE  CHOLANGIOPANCREATOGRAPHY (ERCP);  Surgeon: Ladene Artist, MD;  Location: [facility] ENDOSCOPY;  Service: Endoscopy;  Laterality: N/A;   KNEE CARTILAGE SURGERY Left    LEFT HEART CATH AND CORONARY ANGIOGRAPHY N/A 12/21/2017   Procedure: LEFT HEART CATH AND CORONARY ANGIOGRAPHY;  Surgeon: Nelva Bush, MD;  Location: [facility];  Service: Cardiovascular;  Laterality: N/A;   LITHOTRIPSY     REMOVAL OF STONES  10/23/2021   Procedure: REMOVAL OF STONES;  Surgeon: Ladene Artist, MD;  [facility] ENDOSCOPY;  Service: Endoscopy;;   SPHINCTEROTOMY  10/23/2021   Procedure: Joan Mayans;  Surgeon: Ladene Artist, MD;  Location: [facility];  Service: Endoscopy;;   TEE WITHOUT CARDIOVERSION N/A 01/18/2018   Procedure: TRANSESOPHAGEAL ECHOCARDIOGRAM (TEE);  Surgeon: Prescott Gum, Collier Salina, MD;  Location: [facility];  Service: Open Heart Surgery;  Laterality: N/A;   TEE WITHOUT CARDIOVERSION N/A 10/28/2021   Procedure: TRANSESOPHAGEAL ECHOCARDIOGRAM (TEE);  Surgeon: Pixie Casino, MD;  Location: [facility] ENDOSCOPY;  Service: Cardiovascular;  Laterality: N/A;   UMBILICAL HERNIA REPAIR     with mesh    Family History  Problem Relation Age of Onset   Alzheimer's disease Mother    Breast cancer Mother        breast   Atrial fibrillation Mother    CAD Mother    Stroke Father 30   Diabetes Father    CAD Father    Colon polyps Sister    Colon polyps Brother    Rectal cancer Maternal Grandfather        rectal   Colon cancer Maternal Grandfather 88   Esophageal cancer Neg Hx    Stomach cancer Neg Hx     Social History   Socioeconomic History   Marital status: Married    Spouse name: Not on file   Number of children: Not on file   Years of education: Not on file   Highest education level: Not on file  Occupational History   Not on file  Tobacco Use   Smoking status: Never    Passive exposure: Past   Smokeless tobacco: Former    Types: Chew    Quit date: 09/08/2001  Vaping Use   Vaping Use:  Never used  Substance and Sexual Activity   Alcohol use: Not Currently    Comment: occ   Drug use: No   Sexual activity: Not on file  Other Topics Concern   Not on file  Social History Narrative   Caffeine: occasional Lives with wife, [name] 2009)Occupation: retired, 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.Diet: some water, fruits/vegetables daily, avoids potatoes   Social Determinants of Health   Financial Resource Strain: Not on file  Food Insecurity: No Food Insecurity (09/26/2022)   Hunger Vital Sign    Worried About Running Out of Food in the Last Year: Never true    Ran Out of Food in the Last Year: Never true  Transportation Needs: No Transportation Needs (09/26/2022)   PRAPARE - Hydrologist (Medical): No    Lack of Transportation (Non-Medical): No  Physical Activity: Not on file  Stress: Not on file  Social Connections: Not on file  Intimate Partner Violence: Not on file    Review of Systems  Constitutional:  Positive for fatigue.  Respiratory:  Positive for cough and shortness of breath.     Vitals:   10/12/22 1359  BP: 118/78  Pulse: 71  SpO2: 99%     Physical Exam Constitutional:      Appearance: He is obese.  HENT:     Head: Normocephalic.     Mouth/Throat:     Mouth: Mucous membranes are moist.  Eyes:     General: No scleral icterus. Cardiovascular:     Rate and Rhythm: Normal rate and regular rhythm.     Heart sounds: No murmur heard.    No friction rub.  Pulmonary:     Effort: No respiratory distress.     Breath sounds: No stridor. No wheezing or rhonchi.  Musculoskeletal:     Cervical back: No rigidity or tenderness.  Neurological:     Mental Status: He is alert.  Psychiatric:        Mood and Affect: Mood normal.    Data Reviewed: PFT from  2019 with no obstruction, no restriction, normal diffusing capacity  CT scan from August 2023, CT of the abdomen did show a right pleural  effusion  Recent chest x-ray 10/11/2022 with right pleural effusion, prominent bronchovascular markings-reviewed with the patient  Assessment:  Subacute cough  Right pleural effusion  Shortness of breath on exertion  Denies underlying lung disease  History of coronary artery disease  Diastolic heart failure  Progressive deconditioning  Plan/Recommendations: Course of antibiotics-doxycycline for 7 days  Short course of steroids for 7 days-prednisone 20 p.o. daily  Schedule with IR for ultrasound-guided thoracentesis  Schedule for pulmonary function test at day of next visit  Follow-up in 4 to 5 weeks  Prescription for Hycodan sent to pharmacy  Encouraged to call with significant concerns    Sherrilyn Rist MD [facility] 10/12/2022, 2:22 PM  CC: Owens Loffler, MD

## 2022-10-13 ENCOUNTER — Ambulatory Visit
Admission: RE | Admit: 2022-10-13 | Discharge: 2022-10-13 | Disposition: A | Discharge status: Home / Self Care | Payer: Medicare PPO | Source: Ambulatory Visit | Attending: Pulmonary Disease | Admitting: Pulmonary Disease

## 2022-10-13 ENCOUNTER — Other Ambulatory Visit: Payer: Self-pay | Admitting: Pulmonary Disease

## 2022-10-13 VITALS — BP 136/69 | HR 76

## 2022-10-13 DIAGNOSIS — J90 Pleural effusion, not elsewhere classified: Secondary | ICD-10-CM | POA: Insufficient documentation

## 2022-10-13 DIAGNOSIS — R053 Chronic cough: Secondary | ICD-10-CM | POA: Insufficient documentation

## 2022-10-13 DIAGNOSIS — Z9889 Other specified postprocedural states: Secondary | ICD-10-CM | POA: Insufficient documentation

## 2022-10-13 LAB — BODY FLUID CELL COUNT WITH DIFFERENTIAL
Eos, Fluid: 0 %
Lymphs, Fluid: 26 %
Monocyte-Macrophage-Serous Fluid: 13 %
Neutrophil Count, Fluid: 61 %
Total Nucleated Cell Count, Fluid: 850 cu mm

## 2022-10-13 LAB — LACTATE DEHYDROGENASE, PLEURAL OR PERITONEAL FLUID: LD, Fluid: 82 U/L — ABNORMAL HIGH (ref 3–23)

## 2022-10-13 LAB — GLUCOSE, PLEURAL OR PERITONEAL FLUID: Glucose, Fluid: 113 mg/dL

## 2022-10-13 LAB — ALBUMIN, PLEURAL OR PERITONEAL FLUID: Albumin, Fluid: 2.2 g/dL

## 2022-10-13 LAB — PROTEIN, PLEURAL OR PERITONEAL FLUID: Total protein, fluid: 4.4 g/dL

## 2022-10-13 MED ORDER — LIDOCAINE HCL (PF) 1 % IJ SOLN
15.0000 mL | Freq: Once | INTRAMUSCULAR | Status: AC
  Administered 2022-10-13: 15 mL via INTRADERMAL
  Filled 2022-10-13: qty 15

## 2022-10-13 NOTE — Procedures (Signed)
PROCEDURE SUMMARY:  Successful US guided diagnostic and therapeutic right thoracentesis. Yielded 1 L of hazy, yellow fluid. Pt tolerated procedure well. No immediate complications.  Specimen was sent for labs. CXR ordered.  EBL < 1 mL  Tyson Alias, AGNP 10/13/2022 11:21 AM

## 2022-10-15 LAB — ACID FAST SMEAR (AFB, MYCOBACTERIA): Acid Fast Smear: NEGATIVE

## 2022-10-16 LAB — CYTOLOGY - NON PAP

## 2022-10-16 LAB — BODY FLUID CULTURE W GRAM STAIN: Culture: NO GROWTH

## 2022-10-18 LAB — CHOLESTEROL, BODY FLUID: Cholesterol, Fluid: 53 mg/dL

## 2022-10-19 LAB — TRIGLYCERIDES, BODY FLUIDS: Triglycerides, Fluid: 28 mg/dL

## 2022-10-20 LAB — MISC LABCORP TEST (SEND OUT): Labcorp test code: 9985

## 2022-10-24 ENCOUNTER — Telehealth: Payer: Self-pay

## 2022-10-24 NOTE — Telephone Encounter (Signed)
Per letter received from [facility], dapagliflozin 5 mg tablets are non-formulary and will no longer be covered by patient's insurance plan.  I spoke with Vonna Kotyk from [facility] who states [name] Young is the preferred drug and would cost $40 per 30 day supply. Please advise if [name] Young is appropriate. Thank you!  Call reference [id number]

## 2022-10-24 NOTE — Telephone Encounter (Signed)
It is fine to switch from dapagliflozin to empagliflozin 10 mg daily.  Nelva Bush, MD [facility]

## 2022-10-25 MED ORDER — EMPAGLIFLOZIN 10 MG PO TABS: 10.0000 mg | ORAL_TABLET | Freq: Every day | ORAL | 3 refills | Status: DC

## 2022-10-25 NOTE — Addendum Note (Signed)
Addended by: Meryl Crutch on: 10/25/2022 09:24 AM   Modules accepted: Orders

## 2022-10-25 NOTE — Telephone Encounter (Signed)
Spoke to pt's wife. Ok per [employer].  Informed pt's wife we received notification from [facility] stating fargixa is not on pt's formulary and requested to switch to [place]. Wife made aware Dr. Saunders Revel is agreeable to switch.  Wife verbalized understanding Fargixa discontinued and Jardiance sent to requested pharmacy.

## 2022-10-26 NOTE — Progress Notes (Signed)
Pleural fluid shows no infection, no cancer cells.

## 2022-11-10 ENCOUNTER — Ambulatory Visit (INDEPENDENT_AMBULATORY_CARE_PROVIDER_SITE_OTHER): Payer: Medicare PPO | Admitting: Pulmonary Disease

## 2022-11-10 ENCOUNTER — Encounter: Payer: Self-pay | Admitting: Pulmonary Disease

## 2022-11-10 ENCOUNTER — Ambulatory Visit: Payer: Medicare PPO | Admitting: Pulmonary Disease

## 2022-11-10 VITALS — BP 142/62 | HR 80 | Ht 70.0 in | Wt 228.2 lb

## 2022-11-10 DIAGNOSIS — R053 Chronic cough: Secondary | ICD-10-CM

## 2022-11-10 DIAGNOSIS — R052 Subacute cough: Secondary | ICD-10-CM | POA: Diagnosis not present

## 2022-11-10 LAB — PULMONARY FUNCTION TEST
DL/VA % pred: 116 %
DL/VA: 4.54 ml/min/mmHg/L
DLCO cor % pred: 77 %
DLCO cor: 18.96 ml/min/mmHg
DLCO unc % pred: 77 %
DLCO unc: 18.96 ml/min/mmHg
FEF 25-75 Post: 1.09 L/sec
FEF 25-75 Pre: 1.8 L/sec
FEF2575-%Change-Post: -39 %
FEF2575-%Pred-Post: 54 %
FEF2575-%Pred-Pre: 91 %
FEV1-%Change-Post: -25 %
FEV1-%Pred-Post: 52 %
FEV1-%Pred-Pre: 70 %
FEV1-Post: 1.51 L
FEV1-Pre: 2.01 L
FEV1FVC-%Change-Post: -25 %
FEV1FVC-%Pred-Pre: 108 %
FEV6-%Change-Post: 0 %
FEV6-%Pred-Post: 68 %
FEV6-%Pred-Pre: 68 %
FEV6-Post: 2.59 L
FEV6-Pre: 2.58 L
FEV6FVC-%Change-Post: 0 %
FEV6FVC-%Pred-Post: 107 %
FEV6FVC-%Pred-Pre: 106 %
FVC-%Change-Post: 0 %
FVC-%Pred-Post: 64 %
FVC-%Pred-Pre: 64 %
FVC-Post: 2.59 L
FVC-Pre: 2.59 L
Post FEV1/FVC ratio: 58 %
Post FEV6/FVC ratio: 100 %
Pre FEV1/FVC ratio: 78 %
Pre FEV6/FVC Ratio: 99 %

## 2022-11-10 NOTE — Patient Instructions (Addendum)
Graded exercise as tolerated  Use cough medicine to help your cough  Call us with significant concerns  I will see you back in about 6 weeks  Your breathing study today, though not optimal with your coughing spells during the study, is not significantly changed from one you had a few years back

## 2022-11-10 NOTE — Progress Notes (Signed)
Full PFT performed today. Patient did not meet ATS standards on the PLETH.

## 2022-11-10 NOTE — Patient Instructions (Signed)
Full PFT performed today. Patient did not meet ATS standards on the PLETH.

## 2022-11-10 NOTE — Progress Notes (Signed)
[name] Young    IG:[id number]    12/04/1940  Primary Care Physician:Gutierrez, Garlon Hatchet, MD  Referring Physician: Ria Bush, MD [place],  [place] [address]  Chief complaint:   Patient being seen for subacute cough  HPI:  Continues to have intermittent cough with clear phlegm Has not been having fevers or chills  According to spouse, did not use the cough medicine  Does get short of breath with minimal exertion Cough appears to be little bit better but still persist then Bringing up clear stringy phlegm  Has not recently started any new medications that may be responsible for the cough Denies significant nasal stuffiness or congestion  Some chest discomfort with protracted coughing  He feels whenever he is laying on one side he is able to clear secretions better   Remote history of smoking cigars here and there, was never really a smoker  No significant occupational exposure worked outdoors, worked for [employer] for many years  History of hypertension, diabetes, hypercholesterolemia, coronary artery disease for which he had bypass in 2018  Associated with the cough is more shortness of breath, has felt more limited with activities of daily living, decreased usual routine  Coughing that keeps him up at night  Was evaluated in the emergency department/urgent care Recent chest x-ray showing right pleural effusion  Was recently treated with a course of azithromycin that did not really help Benzonatate does not help the cough  Denies any significant leg swelling  Outpatient Encounter Medications as of 11/10/2022  Medication Sig   ACCU-CHEK AVIVA PLUS test strip USE AS DIRECTED TO CHECK BLOOD SUGARS UPTO FOUR TIMES DAILY.   apixaban (ELIQUIS) 5 MG TABS tablet TAKE 1 TABLET BY MOUTH TWICE A DAY   blood glucose meter kit and supplies Dispense based on patient and insurance preference. Use up to four times daily as directed. (FOR ICD-10  E10.9, E11.9).   Cholecalciferol (VITAMIN D3) 25 MCG (1000 UT) CAPS Take 1 capsule (1,000 Units total) by mouth daily.   Cyanocobalamin (B-12) 1000 MCG CAPS Take by mouth daily at 2 am.   empagliflozin (JARDIANCE) 10 MG TABS tablet Take 1 tablet (10 mg total) by mouth daily before breakfast. (Patient not taking: Reported on 11/10/2022)   ENTRESTO 24-26 MG TAKE 1 TABLET BY MOUTH TWICE A DAY   FARXIGA 5 MG TABS tablet Take 5 mg by mouth daily.   fenofibrate (TRICOR) 145 MG tablet TAKE 1 TABLET BY MOUTH EVERY DAY   HYDROcodone bit-homatropine (HYCODAN) 5-1.5 MG/5ML syrup Take 5 mLs by mouth every 6 (six) hours as needed for cough.   metFORMIN (GLUCOPHAGE) 500 MG tablet TAKE 1 TABLET BY MOUTH EVERY DAY WITH BREAKFAST   metoprolol succinate (TOPROL-XL) 50 MG 24 hr tablet TAKE 1 TABLET BY MOUTH DAILY. TAKE WITH OR IMMEDIATELY FOLLOWING A MEAL.   Multiple Vitamins-Minerals (MACULAR VITAMIN BENEFIT PO) Take 1 tablet by mouth daily.   nitroGLYCERIN (NITROSTAT) 0.4 MG SL tablet Place 1 tablet (0.4 mg total) under the tongue every 5 (five) minutes as needed for chest pain.   Omega-3 Fatty Acids (FISH OIL) 1200 MG CAPS Take 2 capsules (2,400 mg total) by mouth daily.   omeprazole (PRILOSEC) 40 MG capsule TAKE 1 CAPSULE BY MOUTH EVERY MONDAY,WEDNESDAY, AND FRIDAY   rosuvastatin (CRESTOR) 10 MG tablet TAKE 1 TABLET BY MOUTH EVERY DAY   tamsulosin (FLOMAX) 0.4 MG CAPS capsule TAKE 1 CAPSULE BY MOUTH EVERY DAY  doxycycline (VIBRA-TABS) 100 MG tablet Take 1 tablet (100 mg total) by mouth 2 (two) times daily. (Patient not taking: Reported on 11/10/2022)   predniSONE (DELTASONE) 20 MG tablet Take 1 tablet (20 mg total) by mouth daily with breakfast. (Patient not taking: Reported on 11/10/2022)   No facility-administered encounter medications on file as of 11/10/2022.    Allergies as of 11/10/2022   (No Known Allergies)    Past Medical History:  Diagnosis Date   Adenomatous colon polyp    Aortic atherosclerosis  (HCC)    Arthritis    Ascending aorta dilatation ([place])    a.) TTE 11/23/2017: asc Ao measured 37 mm; b.) TTE 10/23/2021: Ao root measured 41 mm, asc Ao measured 38 mm; c.) TEE 10/28/2021: asc Ao measured 38 mm; d.) TTE 01/11/2022: Ao root 40 mm, asc Ao 39 mm   Ascending cholangitis 10/2021   BPH (benign prostatic hyperplasia)    Cardiomyopathy (HCC)    CKD (chronic kidney disease), stage III (HCC)    Claustrophobia    Coronary artery disease    a.) LHC 12/21/2017: 75% mLAD, 90% D1, 80-99% OM1/2, CTO pRCA (L-R collaterals) --> CVTS consult. 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   In setting of bacteremia from ascending colangitis   GERD (gastroesophageal reflux disease)    HFrEF (heart failure with reduced ejection fraction) ([place])    a.) TTE 11/23/2017: EF 50-55%, mod MAC, triv TR, G1DD; b.) TTE 10/23/2021: EF 40-45%, mild LAE, Ao sclerosis, triv MR, G2DD; c.) TEE 10/28/2021: EF 40-45%, glob HK, mobile vegitation on MV; d.) TTE 01/11/2022: EF 50-55%, mild LVH, RVE, Ao sclerosis, mild MR/AR, G1DD   History of cholelithiasis    History of kidney stones    ca ox [name] @ [facility]) now [employer]   History of pneumonia    HLD (hyperlipidemia)    HTN (hypertension)    Jaundice    age 43   Long term current use of anticoagulant    a.) apixaban   Paroxysmal atrial fibrillation ([place])    a.) CHA2DS2VASc = 6 (age x 2, HFrEF, HTN, vascular disease history, T2DM);  b.) rate/rhythm maintained on oral metoprolol succinate; chronically anticoagulated with apixaban   Pneumonia    Right-sided carotid artery disease ([place])    a.) carotid doppler 04/05/2022: 1-39% RICA   S/P CABG x 3    a.) LIMA-LAD, SVG-diagonal, SVG-PL branch of RCA   S/P cataract extraction and insertion of intraocular lens    T2DM (type 2 diabetes mellitus) ([place]) 2010    Past Surgical History:  Procedure Laterality Date   CARPAL TUNNEL RELEASE Bilateral    CATARACT  EXTRACTION, BILATERAL     COLONOSCOPY  11/2012   11 adenomatous polyps, diverticulosis, rec rpt 1 yr Ardis Hughs)   COLONOSCOPY  12/2013   3 polyps, diverticulosis, rec rpt 3 yrs Ardis Hughs)   COLONOSCOPY  06/2019   6 polyps (TA), diverticulosis, f/u left open ended Ardis Hughs)   CORONARY ARTERY BYPASS GRAFT N/A 01/18/2018   Procedure: CORONARY ARTERY BYPASS GRAFTING (CABG) x 3; Using Left Internal Mammary Artery, and Right Greater Saphenous Vein harvested Endoscopically, Coronary Artery Endarterectomy;  Surgeon: Ivin Poot, MD;  Location: [place];  Service: Open Heart Surgery;  Laterality: N/A;   ERCP N/A 10/23/2021   Procedure: ENDOSCOPIC RETROGRADE CHOLANGIOPANCREATOGRAPHY (ERCP);  Surgeon: Ladene Artist, MD;  Location: [facility] ENDOSCOPY;  Service: Endoscopy;  Laterality: N/A;   KNEE CARTILAGE SURGERY Left  LEFT HEART CATH AND CORONARY ANGIOGRAPHY N/A 12/21/2017   Procedure: LEFT HEART CATH AND CORONARY ANGIOGRAPHY;  Surgeon: Nelva Bush, MD;  Location: [place] CV LAB;  Service: Cardiovascular;  Laterality: N/A;   LITHOTRIPSY     REMOVAL OF STONES  10/23/2021   Procedure: REMOVAL OF STONES;  Surgeon: Ladene Artist, MD;  Location: [facility] ENDOSCOPY;  Service: Endoscopy;;   SPHINCTEROTOMY  10/23/2021   Procedure: Joan Mayans;  Surgeon: Ladene Artist, MD;  Location: [place];  Service: Endoscopy;;   TEE WITHOUT CARDIOVERSION N/A 01/18/2018   Procedure: TRANSESOPHAGEAL ECHOCARDIOGRAM (TEE);  Surgeon: Prescott Gum, Collier Salina, MD;  Location: [place];  Service: Open Heart Surgery;  Laterality: N/A;   TEE WITHOUT CARDIOVERSION N/A 10/28/2021   Procedure: TRANSESOPHAGEAL ECHOCARDIOGRAM (TEE);  Surgeon: Pixie Casino, MD;  Location: [facility] ENDOSCOPY;  Service: Cardiovascular;  Laterality: N/A;   UMBILICAL HERNIA REPAIR     with mesh    Family History  Problem Relation Age of Onset   Alzheimer's disease Mother    Breast cancer Mother        breast   Atrial fibrillation Mother    CAD Mother     Stroke Father 80   Diabetes Father    CAD Father    Colon polyps Sister    Colon polyps Brother    Rectal cancer Maternal Grandfather        rectal   Colon cancer Maternal Grandfather 88   Esophageal cancer Neg Hx    Stomach cancer Neg Hx     Social History   Socioeconomic History   Marital status: Married    Spouse name: Not on file   Number of children: Not on file   Years of education: Not on file   Highest education level: Not on file  Occupational History   Not on file  Tobacco Use   Smoking status: Never    Passive exposure: Past   Smokeless tobacco: Former    Types: Chew    Quit date: 09/08/2001  Vaping Use   Vaping Use: Never used  Substance and Sexual Activity   Alcohol use: Not Currently    Comment: occ   Drug use: No   Sexual activity: Not on file  Other Topics Concern   Not on file  Social History Narrative   Caffeine: occasional Lives with wife, [name] 2009)Occupation: retired, 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.Diet: some water, fruits/vegetables daily, avoids potatoes   Social Determinants of Health   Financial Resource Strain: Not on file  Food Insecurity: No Food Insecurity (09/26/2022)   Hunger Vital Sign    Worried About Running Out of Food in the Last Year: Never true    Ran Out of Food in the Last Year: Never true  Transportation Needs: No Transportation Needs (09/26/2022)   PRAPARE - Hydrologist (Medical): No    Lack of Transportation (Non-Medical): No  Physical Activity: Not on file  Stress: Not on file  Social Connections: Not on file  Intimate Partner Violence: Not on file    Review of Systems  Constitutional:  Positive for fatigue.  Respiratory:  Positive for cough and shortness of breath.     Vitals:   11/10/22 1001  BP: (!) 142/62  Pulse: 80  SpO2: 98%     Physical Exam Constitutional:      Appearance: He is obese.  HENT:     Head: Normocephalic.      Mouth/Throat:  Mouth: Mucous membranes are moist.  Eyes:     General: No scleral icterus. Cardiovascular:     Rate and Rhythm: Normal rate and regular rhythm.     Heart sounds: No murmur heard.    No friction rub.  Pulmonary:     Effort: No respiratory distress.     Breath sounds: No stridor. No wheezing or rhonchi.  Musculoskeletal:     Cervical back: No rigidity or tenderness.  Neurological:     Mental Status: He is alert.  Psychiatric:        Mood and Affect: Mood normal.    Data Reviewed: PFT from 2019 with no obstruction, no restriction, normal diffusing capacity  CT scan from August 2023, CT of the abdomen did show a right pleural effusion  Recent chest x-ray 10/11/2022 with right pleural effusion, prominent bronchovascular markings-reviewed with the patient  PFT reviewed showing a suboptimal study, mild obstruction, lung capacity could not be performed -Stable compared to one performed in 2019  Chest x-ray with no new effusion  Pleural fluid was benign, cytology negative  Assessment:  Chronic cough  Significant deconditioning  History of coronary artery disease  Diastolic heart failure   Plan/Recommendations: Encourage graded exercise as tolerated  Encouraged to use cough medicine for symptomatic relief  Encouraged to call with significant concerns  Follow-up in about 6 weeks     Sherrilyn Rist MD [facility] 11/10/2022, 10:14 AM  CC: Ria Bush, MD

## 2022-11-13 LAB — FUNGUS CULTURE RESULT

## 2022-11-13 LAB — FUNGUS CULTURE WITH STAIN

## 2022-11-13 LAB — FUNGAL ORGANISM REFLEX

## 2022-11-28 LAB — ACID FAST CULTURE WITH REFLEXED SENSITIVITIES (MYCOBACTERIA): Acid Fast Culture: NEGATIVE

## 2022-12-21 ENCOUNTER — Ambulatory Visit: Payer: Medicare PPO | Admitting: Podiatry

## 2022-12-21 ENCOUNTER — Encounter: Payer: Self-pay | Admitting: Internal Medicine

## 2022-12-21 ENCOUNTER — Ambulatory Visit: Payer: Medicare PPO | Admitting: Internal Medicine

## 2022-12-21 ENCOUNTER — Ambulatory Visit (INDEPENDENT_AMBULATORY_CARE_PROVIDER_SITE_OTHER)
Admission: RE | Admit: 2022-12-21 | Discharge: 2022-12-21 | Disposition: A | Discharge status: Home / Self Care | Payer: Medicare PPO | Source: Ambulatory Visit | Attending: Internal Medicine | Admitting: Internal Medicine

## 2022-12-21 ENCOUNTER — Encounter: Payer: Self-pay | Admitting: Podiatry

## 2022-12-21 ENCOUNTER — Other Ambulatory Visit: Payer: Self-pay | Admitting: Internal Medicine

## 2022-12-21 VITALS — BP 158/75 | HR 45

## 2022-12-21 VITALS — BP 110/60 | HR 71 | Temp 98.2°F | Resp 30 | Ht 70.0 in | Wt 225.0 lb

## 2022-12-21 DIAGNOSIS — B351 Tinea unguium: Secondary | ICD-10-CM | POA: Diagnosis not present

## 2022-12-21 DIAGNOSIS — J90 Pleural effusion, not elsewhere classified: Secondary | ICD-10-CM

## 2022-12-21 DIAGNOSIS — M79674 Pain in right toe(s): Secondary | ICD-10-CM | POA: Diagnosis not present

## 2022-12-21 DIAGNOSIS — E1142 Type 2 diabetes mellitus with diabetic polyneuropathy: Secondary | ICD-10-CM | POA: Diagnosis not present

## 2022-12-21 DIAGNOSIS — M79675 Pain in left toe(s): Secondary | ICD-10-CM

## 2022-12-21 DIAGNOSIS — I5022 Chronic systolic (congestive) heart failure: Secondary | ICD-10-CM | POA: Diagnosis not present

## 2022-12-21 DIAGNOSIS — I480 Paroxysmal atrial fibrillation: Secondary | ICD-10-CM | POA: Diagnosis not present

## 2022-12-21 NOTE — Assessment & Plan Note (Addendum)
Doesn't seem to have exacerbation Has recurrence of effusion and some fluid in the fissure BNP pending--consider furosemide if elevated

## 2022-12-21 NOTE — Progress Notes (Signed)
Subjective:    Patient ID: [name] MerlJulius V Palmer Jr., male    DOB: December 21, 1940, 82 y.o.   MRN: [id number]  HPI Here with wife due to not feeling well  "I am as weak as water" Gets short of breath--just walking short distances (like across the parking lot) Vomited lunch yesterday--and breakfast today Not eating much--just some jello today  Chronic cough---sees a lot of specialists Did have thoracentesis 2 months ago Fluid tests negative--but gave antibiotics just in case  No nausea Some pain along right flank---not really abdominal No fever No chills but did have sweat last night  Hasn't been checking sugars Has been okay in general  Current Outpatient Medications on File Prior to Visit  Medication Sig Dispense Refill   ACCU-CHEK AVIVA PLUS test strip USE AS DIRECTED TO CHECK BLOOD SUGARS UPTO FOUR TIMES DAILY. 100 each 3   apixaban (ELIQUIS) 5 MG TABS tablet TAKE 1 TABLET BY MOUTH TWICE A DAY 60 tablet 5   blood glucose meter kit and supplies Dispense based on patient and insurance preference. Use up to four times daily as directed. (FOR ICD-10 E10.9, E11.9). 1 each 0   Cholecalciferol (VITAMIN D3) 25 MCG (1000 UT) CAPS Take 1 capsule (1,000 Units total) by mouth daily. 30 capsule    Cyanocobalamin (B-12) 1000 MCG CAPS Take by mouth daily at 2 am.     ENTRESTO 24-26 MG TAKE 1 TABLET BY MOUTH TWICE A DAY 60 tablet 4   FARXIGA 5 MG TABS tablet Take 5 mg by mouth daily.     fenofibrate (TRICOR) 145 MG tablet TAKE 1 TABLET BY MOUTH EVERY DAY 90 tablet 1   metFORMIN (GLUCOPHAGE) 500 MG tablet TAKE 1 TABLET BY MOUTH EVERY DAY WITH BREAKFAST 90 tablet 3   metoprolol succinate (TOPROL-XL) 50 MG 24 hr tablet TAKE 1 TABLET BY MOUTH DAILY. TAKE WITH OR IMMEDIATELY FOLLOWING A MEAL. 90 tablet 1   Multiple Vitamins-Minerals (MACULAR VITAMIN BENEFIT PO) Take 1 tablet by mouth daily.     nitroGLYCERIN (NITROSTAT) 0.4 MG SL tablet Place 1 tablet (0.4 mg total) under the tongue every 5 (five)  minutes as needed for chest pain. 25 tablet 4   Omega-3 Fatty Acids (FISH OIL) 1200 MG CAPS Take 2 capsules (2,400 mg total) by mouth daily.     omeprazole (PRILOSEC) 40 MG capsule TAKE 1 CAPSULE BY MOUTH EVERY MONDAY,WEDNESDAY, AND FRIDAY 38 capsule 1   predniSONE (DELTASONE) 20 MG tablet Take 1 tablet (20 mg total) by mouth daily with breakfast. 7 tablet 0   rosuvastatin (CRESTOR) 10 MG tablet TAKE 1 TABLET BY MOUTH EVERY DAY 90 tablet 3   tamsulosin (FLOMAX) 0.4 MG CAPS capsule TAKE 1 CAPSULE BY MOUTH EVERY DAY 90 capsule 3   No current facility-administered medications on file prior to visit.    No Known Allergies  Past Medical History:  Diagnosis Date   Adenomatous colon polyp    Aortic atherosclerosis    Arthritis    Ascending aorta dilatation    a.) TTE 11/23/2017: asc Ao measured 37 mm; b.) TTE 10/23/2021: Ao root measured 41 mm, asc Ao measured 38 mm; c.) TEE 10/28/2021: asc Ao measured 38 mm; d.) TTE 01/11/2022: Ao root 40 mm, asc Ao 39 mm   Ascending cholangitis 10/2021   BPH (benign prostatic hyperplasia)    Cardiomyopathy    CKD (chronic kidney disease), stage III    Claustrophobia    Coronary artery disease    a.) LHC 12/21/2017: 75% mLAD,  90% D1, 80-99% OM1/2, CTO pRCA (L-R collaterals) --> CVTS consult. 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   In setting of bacteremia from ascending colangitis   GERD (gastroesophageal reflux disease)    HFrEF (heart failure with reduced ejection fraction)    a.) TTE 11/23/2017: EF 50-55%, mod MAC, triv TR, G1DD; b.) TTE 10/23/2021: EF 40-45%, mild LAE, Ao sclerosis, triv MR, G2DD; c.) TEE 10/28/2021: EF 40-45%, glob HK, mobile vegitation on MV; d.) TTE 01/11/2022: EF 50-55%, mild LVH, RVE, Ao sclerosis, mild MR/AR, G1DD   History of cholelithiasis    History of kidney stones    ca ox Vonita Moss @ [facility]) now [employer]   History of pneumonia    HLD (hyperlipidemia)    HTN  (hypertension)    Jaundice    age 53   Long term current use of anticoagulant    a.) apixaban   Paroxysmal atrial fibrillation    a.) CHA2DS2VASc = 6 (age x 2, HFrEF, HTN, vascular disease history, T2DM);  b.) rate/rhythm maintained on oral metoprolol succinate; chronically anticoagulated with apixaban   Pneumonia    Right-sided carotid artery disease    a.) carotid doppler 04/05/2022: 1-39% RICA   S/P CABG x 3    a.) LIMA-LAD, SVG-diagonal, SVG-PL branch of RCA   S/P cataract extraction and insertion of intraocular lens    T2DM (type 2 diabetes mellitus) 2010    Past Surgical History:  Procedure Laterality Date   CARPAL TUNNEL RELEASE Bilateral    CATARACT EXTRACTION, BILATERAL     COLONOSCOPY  11/2012   11 adenomatous polyps, diverticulosis, rec rpt 1 yr [name])   COLONOSCOPY  12/2013   3 polyps, diverticulosis, rec rpt 3 yrs [name])   COLONOSCOPY  06/2019   6 polyps (TA), diverticulosis, f/u left open ended [name])   CORONARY ARTERY BYPASS GRAFT N/A 01/18/2018   Procedure: CORONARY ARTERY BYPASS GRAFTING (CABG) x 3; Using Left Internal Mammary Artery, and Right Greater Saphenous Vein harvested Endoscopically, Coronary Artery Endarterectomy;  Surgeon: Kerin Perna, MD;  Location: [facility] OR;  Service: Open Heart Surgery;  Laterality: N/A;   ERCP N/A 10/23/2021   Procedure: ENDOSCOPIC RETROGRADE CHOLANGIOPANCREATOGRAPHY (ERCP);  Surgeon: Meryl Dare, MD;  Location: [facility] ENDOSCOPY;  Service: Endoscopy;  Laterality: N/A;   KNEE CARTILAGE SURGERY Left    LEFT HEART CATH AND CORONARY ANGIOGRAPHY N/A 12/21/2017   Procedure: LEFT HEART CATH AND CORONARY ANGIOGRAPHY;  Surgeon: Yvonne Kendall, MD;  Location: [facility] INVASIVE CV LAB;  Service: Cardiovascular;  Laterality: N/A;   LITHOTRIPSY     REMOVAL OF STONES  10/23/2021   Procedure: REMOVAL OF STONES;  Surgeon: Meryl Dare, MD;  Location: [facility] ENDOSCOPY;  Service: Endoscopy;;   SPHINCTEROTOMY  10/23/2021   Procedure: Dennison Mascot;   Surgeon: Meryl Dare, MD;  Location[facility] ENDOSCOPY;  Service: Endoscopy;;   TEE WITHOUT CARDIOVERSION N/A 01/18/2018   Procedure: TRANSESOPHAGEAL ECHOCARDIOGRAM (TEE);  Surgeon: Donata Clay, Theron Arista, MD;  Location: [facility] OR;  Service: Open Heart Surgery;  Laterality: N/A;   TEE WITHOUT CARDIOVERSION N/A 10/28/2021   Procedure: TRANSESOPHAGEAL ECHOCARDIOGRAM (TEE);  Surgeon: Chrystie Nose, MD;  Location: [facility] ENDOSCOPY;  Service: Cardiovascular;  Laterality: N/A;   UMBILICAL HERNIA REPAIR     with mesh    Family History  Problem Relation Age of Onset   Alzheimer's disease Mother    Breast cancer Mother        breast  Atrial fibrillation Mother    CAD Mother    Stroke Father 68   Diabetes Father    CAD Father    Colon polyps Sister    Colon polyps Brother    Rectal cancer Maternal Grandfather        rectal   Colon cancer Maternal Grandfather 44   Esophageal cancer Neg Hx    Stomach cancer Neg Hx     Social History   Socioeconomic History   Marital status: Married    Spouse name: Not on file   Number of children: Not on file   Years of education: Not on file   Highest education level: Not on file  Occupational History   Not on file  Tobacco Use   Smoking status: Never    Passive exposure: Past   Smokeless tobacco: Former    Types: Chew    Quit date: 09/08/2001  Vaping Use   Vaping Use: Never used  Substance and Sexual Activity   Alcohol use: Not Currently    Comment: occ   Drug use: No   Sexual activity: Not on file  Other Topics Concern   Not on file  Social History Narrative   Caffeine: occasional Lives with wife, grandson [name] 2009)Occupation: retired, 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.Diet: some water, fruits/vegetables daily, avoids potatoes   Social Determinants of Health   Financial Resource Strain: Not on file  Food Insecurity: No Food Insecurity (09/26/2022)   Hunger Vital Sign    Worried About Running Out of Food in the Last  Year: Never true    Ran Out of Food in the Last Year: Never true  Transportation Needs: No Transportation Needs (09/26/2022)   PRAPARE - Administrator, Civil Service (Medical): No    Lack of Transportation (Non-Medical): No  Physical Activity: Not on file  Stress: Not on file  Social Connections: Not on file  Intimate Partner Violence: Not on file   Review of Systems No chest pain No diarrhea----no stool in 3 days Did lose some weight with sepsis spell last year--and a few more pounds since then    Objective:   Physical Exam Constitutional:      Appearance: Normal appearance.     Comments: Trouble getting up on table  Cardiovascular:     Rate and Rhythm: Normal rate. Rhythm irregular.     Heart sounds: No murmur heard.    No gallop.  Pulmonary:     Comments: Dullness and decreased breath sounds 1/2 way up on right Abdominal:     Palpations: Abdomen is soft.     Tenderness: There is no abdominal tenderness.  Musculoskeletal:     Right lower leg: No edema.     Left lower leg: No edema.  Lymphadenopathy:     Cervical: No cervical adenopathy.  Neurological:     Mental Status: He is alert.            Assessment & Plan:

## 2022-12-21 NOTE — Progress Notes (Signed)
This patient returns to my office for at risk foot care.  This patient requires this care by a professional since this patient will be at risk due to having diabetres and neuropathy.  Patient presents to the office with her husband.  This patient is unable to cut nails himself since the patient cannot reach his nails.These nails are painful walking and wearing shoes.  This patient presents for at risk foot care today.  General Appearance  Alert, conversant and in no acute stress.  Vascular  Dorsalis pedis and posterior tibial  pulses are weakly  palpable  bilaterally.  Capillary return is within normal limits  bilaterally. Cold feet  Bilaterally. Absent digital hair.  Neurologic  Senn-Weinstein monofilament wire test absent  bilaterally. Muscle power within normal limits bilaterally.  Nails Thick disfigured discolored nails with subungual debris  from hallux to fifth toes bilaterally. No evidence of bacterial infection or drainage bilaterally.  Orthopedic  No limitations of motion  feet .  No crepitus or effusions noted.  No bony pathology or digital deformities noted.  Skin  normotropic skin with no porokeratosis noted bilaterally.  No signs of infections or ulcers noted.     Onychomycosis  Pain in right toes  Pain in left toes  Consent was obtained for treatment procedures.   Mechanical debridement of nails 1-5  bilaterally performed with a nail nipper.  Filed with dremel without incident.    Return office visit   12 weeks                 Told patient to return for periodic foot care and evaluation due to potential at risk complications.   Lorelei Heikkila DPM  

## 2022-12-21 NOTE — Assessment & Plan Note (Addendum)
Probable cause of DOE No etiology from fluid tests last time Will check CXR to see how bad it is  Awaiting radiologist report---but effusion looks about the same as before the last thoracentesis Called [facility]---ordered repeat thoracentesis.  Spoke to [facility] for just cytology again (since excluded neoplasm is the most important diagnostic factor)

## 2022-12-21 NOTE — Assessment & Plan Note (Signed)
This may be adding to his fatigue--they say he has generally been regular May need cardiology again

## 2022-12-22 ENCOUNTER — Ambulatory Visit
Admission: RE | Admit: 2022-12-22 | Discharge: 2022-12-22 | Disposition: A | Discharge status: Home / Self Care | Payer: Medicare PPO | Source: Ambulatory Visit | Attending: Internal Medicine | Admitting: Internal Medicine

## 2022-12-22 ENCOUNTER — Other Ambulatory Visit: Payer: Self-pay | Admitting: Internal Medicine

## 2022-12-22 ENCOUNTER — Ambulatory Visit
Admission: RE | Admit: 2022-12-22 | Discharge: 2022-12-22 | Disposition: A | Discharge status: Home / Self Care | Payer: Medicare PPO | Source: Ambulatory Visit | Attending: Student | Admitting: Student

## 2022-12-22 ENCOUNTER — Other Ambulatory Visit: Payer: Self-pay | Admitting: Student

## 2022-12-22 DIAGNOSIS — J90 Pleural effusion, not elsewhere classified: Secondary | ICD-10-CM | POA: Insufficient documentation

## 2022-12-22 DIAGNOSIS — Z9889 Other specified postprocedural states: Secondary | ICD-10-CM

## 2022-12-22 LAB — COMPREHENSIVE METABOLIC PANEL
ALT: 4 U/L (ref 0–53)
AST: 8 U/L (ref 0–37)
Albumin: 3.3 g/dL — ABNORMAL LOW (ref 3.5–5.2)
Alkaline Phosphatase: 47 U/L (ref 39–117)
BUN: 27 mg/dL — ABNORMAL HIGH (ref 6–23)
CO2: 23 mEq/L (ref 19–32)
Calcium: 8.8 mg/dL (ref 8.4–10.5)
Chloride: 103 mEq/L (ref 96–112)
Creatinine, Ser: 1.43 mg/dL (ref 0.40–1.50)
GFR: 46.28 mL/min — ABNORMAL LOW (ref >60.00)
Glucose, Bld: 127 mg/dL — ABNORMAL HIGH (ref 70–99)
Potassium: 4.4 mEq/L (ref 3.5–5.1)
Sodium: 139 mEq/L (ref 135–145)
Total Bilirubin: 1.1 mg/dL (ref 0.2–1.2)
Total Protein: 6.5 g/dL (ref 6.0–8.3)

## 2022-12-22 LAB — BODY FLUID CELL COUNT WITH DIFFERENTIAL
Eos, Fluid: 0 %
Lymphs, Fluid: 11 %
Monocyte-Macrophage-Serous Fluid: 8 %
Neutrophil Count, Fluid: 81 %
Total Nucleated Cell Count, Fluid: 1546 cu mm

## 2022-12-22 LAB — CBC
HCT: 37 % — ABNORMAL LOW (ref 39.0–52.0)
Hemoglobin: 12 g/dL — ABNORMAL LOW (ref 13.0–17.0)
MCHC: 32.4 g/dL (ref 30.0–36.0)
MCV: 86.2 fl (ref 78.0–100.0)
Platelets: 340 10*3/uL (ref 150.0–400.0)
RBC: 4.29 Mil/uL (ref 4.22–5.81)
RDW: 16.7 % — ABNORMAL HIGH (ref 11.5–15.5)
WBC: 11.6 10*3/uL — ABNORMAL HIGH (ref 4.0–10.5)

## 2022-12-22 LAB — BRAIN NATRIURETIC PEPTIDE: Pro B Natriuretic peptide (BNP): 357 pg/mL — ABNORMAL HIGH (ref 0.0–100.0)

## 2022-12-22 LAB — TSH: TSH: <0.01 u[IU]/mL — ABNORMAL LOW (ref 0.35–5.50)

## 2022-12-22 MED ORDER — LIDOCAINE HCL (PF) 1 % IJ SOLN
10.0000 mL | Freq: Once | INTRAMUSCULAR | Status: AC
  Administered 2022-12-22: 10 mL via SUBCUTANEOUS
  Filled 2022-12-22: qty 10

## 2022-12-22 MED ORDER — FUROSEMIDE 40 MG PO TABS: 40.0000 mg | ORAL_TABLET | Freq: Every day | ORAL | 0 refills | Status: DC | PRN

## 2022-12-22 NOTE — Procedures (Signed)
PROCEDURE SUMMARY:  Successful image-guided right thoracentesis. Yielded 1.0 L of cloudy yellow fluid. Pt tolerated procedure well. No immediate complications. EBL = trace   Specimen was sent for labs. CXR ordered.  Please see imaging section of Epic for full dictation.  Kennieth Francois PA-C 12/22/2022 2:26 PM

## 2022-12-23 ENCOUNTER — Other Ambulatory Visit: Payer: Self-pay | Admitting: Internal Medicine

## 2022-12-25 ENCOUNTER — Telehealth: Payer: Self-pay

## 2022-12-25 NOTE — Telephone Encounter (Signed)
-----   Message from Karie Schwalbe, MD sent at 12/22/2022  4:22 PM EDT ----- Results released Let him know that the blood test shows he may have fluid overload aside from the fluid in the chest that was taken off today. He should try furosemide 40mg  daily for the next few days and monitor his weight daily. Can review with Dr Reece Agar at next week's visit. Find out if he takes biotin

## 2022-12-25 NOTE — Telephone Encounter (Signed)
Left message to see if he got the furosemide over the weekend. He saw the results over the weekend per MyChart.

## 2022-12-25 NOTE — Telephone Encounter (Signed)
Please advise if ok to refill historical provider. 

## 2022-12-25 NOTE — Telephone Encounter (Signed)
Patient's wife notified as instructed by telephone and verbalized understanding. Patient's wife stated that his shortness of breath does not seem to be much worse. Patient's wife stated that she noticed over the weekend that his speech has not been as good as before. Patient's wife stated that he is not eating but she is pushing fluids. Patient's wife stated that her husband refuses to go to the ER. Patient's wife stated that her husband is coughing up clear mucus. Patient scheduled for an appointment with Dr. [name] tomorrow 12/26/22 at 1:00 pm. Patient's wife was given ER/911 precautions and she verbalized understanding.

## 2022-12-25 NOTE — Telephone Encounter (Signed)
Please schedule with me at 1pm tomorrow. Ensure doesn't have significantly worse shortness of breath - if that is the case, he will need to go to ER today to r/o hole in lung after procedure last week.  I'd like him to try taking lasix 1/2 tab (20mg ) tomorrow am.

## 2022-12-25 NOTE — Telephone Encounter (Signed)
Spoke to pt's wife. She said since Friday, he is even weaker than he was, no appetite. Does not feel good. Did not start the furosemide since he was feeling so bad.   He is scheduled to see Dr Reece Agar on 12-27-22 but was trying to see if Dr Reece Agar would work him in tomorrow. They do not really want to see anyone else.

## 2022-12-26 ENCOUNTER — Ambulatory Visit: Payer: Medicare PPO | Admitting: Family Medicine

## 2022-12-26 ENCOUNTER — Ambulatory Visit (INDEPENDENT_AMBULATORY_CARE_PROVIDER_SITE_OTHER)
Admission: RE | Admit: 2022-12-26 | Discharge: 2022-12-26 | Disposition: A | Discharge status: Home / Self Care | Payer: Medicare PPO | Source: Ambulatory Visit | Attending: Family Medicine | Admitting: Family Medicine

## 2022-12-26 VITALS — BP 100/60 | HR 56 | Temp 97.6°F | Ht 70.0 in | Wt 218.2 lb

## 2022-12-26 DIAGNOSIS — J90 Pleural effusion, not elsewhere classified: Secondary | ICD-10-CM | POA: Diagnosis not present

## 2022-12-26 DIAGNOSIS — E1169 Type 2 diabetes mellitus with other specified complication: Secondary | ICD-10-CM

## 2022-12-26 DIAGNOSIS — I480 Paroxysmal atrial fibrillation: Secondary | ICD-10-CM

## 2022-12-26 DIAGNOSIS — R160 Hepatomegaly, not elsewhere classified: Secondary | ICD-10-CM

## 2022-12-26 DIAGNOSIS — N183 Chronic kidney disease, stage 3 unspecified: Secondary | ICD-10-CM

## 2022-12-26 DIAGNOSIS — Z951 Presence of aortocoronary bypass graft: Secondary | ICD-10-CM

## 2022-12-26 DIAGNOSIS — I499 Cardiac arrhythmia, unspecified: Secondary | ICD-10-CM | POA: Insufficient documentation

## 2022-12-26 DIAGNOSIS — R053 Chronic cough: Secondary | ICD-10-CM | POA: Diagnosis not present

## 2022-12-26 DIAGNOSIS — I5022 Chronic systolic (congestive) heart failure: Secondary | ICD-10-CM | POA: Diagnosis not present

## 2022-12-26 DIAGNOSIS — E1122 Type 2 diabetes mellitus with diabetic chronic kidney disease: Secondary | ICD-10-CM

## 2022-12-26 DIAGNOSIS — R7989 Other specified abnormal findings of blood chemistry: Secondary | ICD-10-CM | POA: Diagnosis not present

## 2022-12-26 DIAGNOSIS — R627 Adult failure to thrive: Secondary | ICD-10-CM

## 2022-12-26 LAB — CBC WITH DIFFERENTIAL/PLATELET
Basophils Absolute: 0 10*3/uL (ref 0.0–0.1)
Basophils Relative: 0.3 % (ref 0.0–3.0)
Eosinophils Absolute: 0.1 10*3/uL (ref 0.0–0.7)
Eosinophils Relative: 0.6 % (ref 0.0–5.0)
HCT: 36.6 % — ABNORMAL LOW (ref 39.0–52.0)
Hemoglobin: 12 g/dL — ABNORMAL LOW (ref 13.0–17.0)
Lymphocytes Relative: 14.6 % (ref 12.0–46.0)
Lymphs Abs: 1.5 10*3/uL (ref 0.7–4.0)
MCHC: 32.7 g/dL (ref 30.0–36.0)
MCV: 84.3 fl (ref 78.0–100.0)
Monocytes Absolute: 1.1 10*3/uL — ABNORMAL HIGH (ref 0.1–1.0)
Monocytes Relative: 10.6 % (ref 3.0–12.0)
Neutro Abs: 7.5 10*3/uL (ref 1.4–7.7)
Neutrophils Relative %: 73.9 % (ref 43.0–77.0)
Platelets: 544 10*3/uL — ABNORMAL HIGH (ref 150.0–400.0)
RBC: 4.34 Mil/uL (ref 4.22–5.81)
RDW: 16.7 % — ABNORMAL HIGH (ref 11.5–15.5)
WBC: 10.2 10*3/uL (ref 4.0–10.5)

## 2022-12-26 LAB — POC URINALSYSI DIPSTICK (AUTOMATED)
Bilirubin, UA: NEGATIVE
Blood, UA: NEGATIVE
Glucose, UA: NEGATIVE
Ketones, UA: NEGATIVE
Leukocytes, UA: NEGATIVE
Nitrite, UA: NEGATIVE
Protein, UA: NEGATIVE
Spec Grav, UA: 1.015 (ref 1.010–1.025)
Urobilinogen, UA: 1 E.U./dL
pH, UA: 5 (ref 5.0–8.0)

## 2022-12-26 LAB — HEMOGLOBIN A1C: Hgb A1c MFr Bld: 5.8 % (ref 4.6–6.5)

## 2022-12-26 LAB — CYTOLOGY - NON PAP

## 2022-12-26 LAB — TSH: TSH: 0.05 u[IU]/mL — ABNORMAL LOW (ref 0.35–5.50)

## 2022-12-26 LAB — CK: Total CK: 17 U/L (ref 7–232)

## 2022-12-26 LAB — T4, FREE: Free T4: 1.68 ng/dL — ABNORMAL HIGH (ref 0.60–1.60)

## 2022-12-26 NOTE — Progress Notes (Signed)
Negative again ?fluid from CHF?

## 2022-12-26 NOTE — Progress Notes (Unsigned)
Ph: [phone]       Fax: [phone]   Patient ID: [name] Merl., male    DOB: 11-20-1940, 82 y.o.   MRN: [id number]  This visit was conducted in person.  BP 100/60 (BP Location: Left Arm, Patient Position: Sitting)   Pulse (!) 56   Temp 97.6 F (36.4 C) (Skin)   Ht 5\' 10"  (1.778 m)   Wt 218 lb 4 oz (99 kg)   SpO2 99%   BMI 31.32 kg/m   BP Readings from Last 3 Encounters:  12/26/22 100/60  12/22/22 (!) 107/48  12/21/22 110/60    CC: follow up visit, feeling worse Subjective:   HPI: [name] Juday. is a 82 y.o. male presenting on 12/26/2022 for Cough   Known CAD s/p 3v CABG and coronary endarterectomy 01/2018. Hospitalized 10/2021 for ascending cholangitis with bacteremia s/p ERCP with sphincterotomy and gallstone extraction, complicated by afib and MV endocarditis s/p IV abx x6 wks via PICC. Underwent lap cholecystectomy   More recently this year developed persistent cough, found to have R pleural effusion 09/2022 s/p pulmonology evaluation and thoracentesis 10/2022 followed by antibiotics (zpack then doxycycline) and prednisone 20mg  7d course. Subsequently saw pulm 11/2022 with PFT showing suboptimal study, mild obstruction, stable compared to one done 2019. He subsequently saw Dr Alphonsus Sias with repeat thoracentesis 12/22/2022 s/p 1L fluid removed - with resolved R pleural effusion on rpt CXR after procedure. Cytology - no cancer seen. ?CHF related.   Rpt CXR today - small residual R effusion without significant recurrence.   New weakness present for the past 1+ week - he has trouble getting out of bed. Wife also notes new slurring of speech present since last week. Staying more short winded at rest.  Vomited a few times last week.  Symptoms overall worse after latest thoracocentesis.  He feels weak - "I can't get out of bed on my own". Has trouble getting up off high seat commode even.  Denies chest pain, abd pain, leg pain, back pain. No unilateral weakness,  numbness, paresthesias. No fevers/chills, no new leg swelling. No dizziness, palpitations, muscle aches.  No dysuria, hematuria, urinary frequency or urgency.   Hasn't scheduled cardiology appt yet.   No fmhx or personal history of thyroid disease. No palpitations, diarrhea, heat intolerance, skin or hair changes.  He stays cold.  No mouth or throat pain. No appetite, decreased food intake, lost taste for meat, decreased portion sizes, 10 lb weight loss in the past few weeks.  Last BM this morning, normal.   He does not want to go to ER today.      Relevant past medical, surgical, family and social history reviewed and updated as indicated. Interim medical history since our last visit reviewed. Allergies and medications reviewed and updated. Outpatient Medications Prior to Visit  Medication Sig Dispense Refill   ACCU-CHEK AVIVA PLUS test strip USE AS DIRECTED TO CHECK BLOOD SUGARS UPTO FOUR TIMES DAILY. (Patient not taking: Reported on 12/28/2022) 100 each 3   apixaban (ELIQUIS) 5 MG TABS tablet TAKE 1 TABLET BY MOUTH TWICE A DAY 60 tablet 5   blood glucose meter kit and supplies Dispense based on patient and insurance preference. Use up to four times daily as directed. (FOR ICD-10 E10.9, E11.9). (Patient not taking: Reported on 12/28/2022) 1 each 0   Cholecalciferol (VITAMIN D3) 25 MCG (1000 UT) CAPS Take 1 capsule (1,000 Units total) by mouth daily. 30 capsule    Cyanocobalamin (B-12) 1000 MCG  CAPS Take by mouth daily at 2 am.     ENTRESTO 24-26 MG TAKE 1 TABLET BY MOUTH TWICE A DAY 60 tablet 4   FARXIGA 5 MG TABS tablet Take 5 mg by mouth daily. (Patient not taking: Reported on 12/28/2022)     fenofibrate (TRICOR) 145 MG tablet TAKE 1 TABLET BY MOUTH EVERY DAY 90 tablet 1   furosemide (LASIX) 40 MG tablet Take 1 tablet (40 mg total) by mouth daily as needed. (Patient not taking: Reported on 12/28/2022) 30 tablet 0   metFORMIN (GLUCOPHAGE) 500 MG tablet TAKE 1 TABLET BY MOUTH EVERY DAY WITH  BREAKFAST (Patient not taking: Reported on 12/28/2022) 90 tablet 3   metoprolol succinate (TOPROL-XL) 50 MG 24 hr tablet TAKE 1 TABLET BY MOUTH DAILY. TAKE WITH OR IMMEDIATELY FOLLOWING A MEAL. 90 tablet 1   Multiple Vitamins-Minerals (MACULAR VITAMIN BENEFIT PO) Take 1 tablet by mouth daily.     nitroGLYCERIN (NITROSTAT) 0.4 MG SL tablet Place 1 tablet (0.4 mg total) under the tongue every 5 (five) minutes as needed for chest pain. 25 tablet 4   Omega-3 Fatty Acids (FISH OIL) 1200 MG CAPS Take 2 capsules (2,400 mg total) by mouth daily.     omeprazole (PRILOSEC) 40 MG capsule TAKE 1 CAPSULE BY MOUTH EVERY MONDAY,WEDNESDAY, AND FRIDAY 38 capsule 1   rosuvastatin (CRESTOR) 10 MG tablet TAKE 1 TABLET BY MOUTH EVERY DAY 90 tablet 3   tamsulosin (FLOMAX) 0.4 MG CAPS capsule TAKE 1 CAPSULE BY MOUTH EVERY DAY 90 capsule 3   predniSONE (DELTASONE) 20 MG tablet Take 1 tablet (20 mg total) by mouth daily with breakfast. (Patient not taking: Reported on 12/26/2022) 7 tablet 0   No facility-administered medications prior to visit.     Per HPI unless specifically indicated in ROS section below Review of Systems  Objective:  BP 100/60 (BP Location: Left Arm, Patient Position: Sitting)   Pulse (!) 56   Temp 97.6 F (36.4 C) (Skin)   Ht 5\' 10"  (1.778 m)   Wt 218 lb 4 oz (99 kg)   SpO2 99%   BMI 31.32 kg/m   Wt Readings from Last 3 Encounters:  12/28/22 217 lb (98.4 kg)  12/26/22 218 lb 4 oz (99 kg)  12/21/22 225 lb (102.1 kg)      Physical Exam Vitals and nursing note reviewed.  Constitutional:      Appearance: Normal appearance. He is ill-appearing.     Comments: Sitting in wheelchair  HENT:     Mouth/Throat:     Mouth: Mucous membranes are moist.     Pharynx: Oropharynx is clear. No oropharyngeal exudate or posterior oropharyngeal erythema.  Eyes:     Extraocular Movements: Extraocular movements intact.     Pupils: Pupils are equal, round, and reactive to light.  Cardiovascular:      Rate and Rhythm: Normal rate. Rhythm irregular. Frequent Extrasystoles are present.    Pulses: Normal pulses.     Heart sounds: Normal heart sounds. No murmur heard. Pulmonary:     Effort: Pulmonary effort is normal. No respiratory distress.     Breath sounds: No wheezing, rhonchi or rales.     Comments: Diminished breath sounds RLL Abdominal:     General: Bowel sounds are normal. There is no distension.     Palpations: Abdomen is soft. There is no mass.     Tenderness: There is no abdominal tenderness. There is no guarding or rebound.     Hernia: No hernia is present.  Musculoskeletal:     Right lower leg: Edema (r) present.     Left lower leg: Edema (tr) present.  Neurological:     Mental Status: He is alert.  Psychiatric:        Mood and Affect: Mood normal.        Behavior: Behavior normal.       Results for orders placed or performed in visit on 12/26/22  TSH  Result Value Ref Range   TSH 0.05 (L) 0.35 - 5.50 uIU/mL  T3  Result Value Ref Range   T3, Total 91 76 - 181 ng/dL  T4, free  Result Value Ref Range   Free T4 1.68 (H) 0.60 - 1.60 ng/dL  CBC with Differential/Platelet  Result Value Ref Range   WBC 10.2 4.0 - 10.5 K/uL   RBC 4.34 4.22 - 5.81 Mil/uL   Hemoglobin 12.0 (L) 13.0 - 17.0 g/dL   HCT 16.1 (L) 09.6 - 04.5 %   MCV 84.3 78.0 - 100.0 fl   MCHC 32.7 30.0 - 36.0 g/dL   RDW 40.9 (H) 81.1 - 91.4 %   Platelets 544.0 (H) 150.0 - 400.0 K/uL   Neutrophils Relative % 73.9 43.0 - 77.0 %   Lymphocytes Relative 14.6 12.0 - 46.0 %   Monocytes Relative 10.6 3.0 - 12.0 %   Eosinophils Relative 0.6 0.0 - 5.0 %   Basophils Relative 0.3 0.0 - 3.0 %   Neutro Abs 7.5 1.4 - 7.7 K/uL   Lymphs Abs 1.5 0.7 - 4.0 K/uL   Monocytes Absolute 1.1 (H) 0.1 - 1.0 K/uL   Eosinophils Absolute 0.1 0.0 - 0.7 K/uL   Basophils Absolute 0.0 0.0 - 0.1 K/uL  Hemoglobin A1c  Result Value Ref Range   Hgb A1c MFr Bld 5.8 4.6 - 6.5 %  CK  Result Value Ref Range   Total CK 17 7 - 232 U/L   POCT Urinalysis Dipstick (Automated)  Result Value Ref Range   Color, UA yellow    Clarity, UA clear    Glucose, UA Negative Negative   Bilirubin, UA negative    Ketones, UA negative    Spec Grav, UA 1.015 1.010 - 1.025   Blood, UA negative    pH, UA 5.0 5.0 - 8.0   Protein, UA Negative Negative   Urobilinogen, UA 1.0 0.2 or 1.0 E.U./dL   Nitrite, UA negative    Leukocytes, UA Negative Negative   Lab Results  Component Value Date   ALT 4 12/21/2022   AST 8 12/21/2022   ALKPHOS 47 12/21/2022   BILITOT 1.1 12/21/2022   BNP 357 (H) 12/21/2022  EKG - NSR rate 80s with p waves present in V3 otherwise hard to find, frequent PVCs, normal axis, intervals, poor R wave progression, no acute ST/T changes  DG Chest 2 View CLINICAL DATA:  Dyspnea. Pleural effusion post thoracentesis last week.  EXAM: CHEST - 2 VIEW  COMPARISON:  Radiographs 12/22/2022 and 12/21/2022.  CT 10/23/2021.  FINDINGS: The heart size and mediastinal contours are stable status post median sternotomy and CABG. Small residual right pleural effusion without significant recurrence. There is no pneumothorax, edema or confluent airspace opacity. The bones appear unchanged.  IMPRESSION: Small residual right pleural effusion without significant recurrence. No acute cardiopulmonary process.  Electronically Signed   By: Carey Bullocks M.D.   On: 12/26/2022 13:09   Assessment & Plan:  I spent 45 minutes caring for this patient today face to face, reviewing labs, prior records from  another facility, performing a medically appropriate examination and/or evaluation, counseling and educating the patient, documenting in the record and arranging for follow up care.   Problem List Items Addressed This Visit     Type 2 diabetes mellitus with other specified complication    Update A1c, hold metformin for now.       CKD stage 3 due to type 2 diabetes mellitus    Update renal panel.       Relevant Orders   POCT  Urinalysis Dipstick (Automated) (Completed)   S/P CABG x 3   FTT (failure to thrive) in adult    Predominant issue, as of yet unclear etiology. Discussed safest place today is ER for expedited evaluation, however he is adamant about avoiding hospitalization at this time.  Check CXR, EKG, labs and urinalysis today.  Hold metformin, lasix for now.       Atrial fibrillation    Sounds irregular, but EKG consistent with frequent PVCs not afib.  He continues eliquis, toprol XL .       Chronic HFrEF (heart failure with reduced ejection fraction)    He continues entresto, toprol XL. Has not been taking lasix. Hold lasix until labs return. Did recommend sooner cards f/u ensure FTT not cardiac related.        Liver mass, right lobe    Prior CT 04/2022 showing irregular hypodense liver dome lesion measuring 5.9x4.3cm - ?intrahepatic abscess - this had not been biopsied during lap chole 05/2022. He had previously declined further evaluation for this. I wonder if this is contributing to  current symptoms including recurrent R pleural effusion - will offer IR guided fluid sampling.       Persistent cough for 3 weeks or longer   Recurrent right pleural effusion - Primary    S/p thoracentesis x2 in the past few months, latest last week.  Cytology negative for malignancy both times, culture negative as well as fungal and AfB cultures.  Initially treated with azithromycin and doxycycline.  Recent fluid analysis with 1500 white cells, PMN predominance.  Update CXR today - overall better.       Relevant Orders   DG Chest 2 View (Completed)   TSH (Completed)   T3 (Completed)   T4, free (Completed)   T3, reverse   CBC with Differential/Platelet (Completed)   Hemoglobin A1c (Completed)   CK (Completed)   Irregular heart rate    Check EKG today - frequent PVCs.  Continue Toprol XL  daily      Relevant Orders   EKG 12-Lead (Completed)   TSH (Completed)   T3 (Completed)   T4, free  (Completed)   T3, reverse   CBC with Differential/Platelet (Completed)   Hemoglobin A1c (Completed)   CK (Completed)   Abnormal TSH    Anticipate due to sick euthyroid - update TFTs including reverse T3 today.         No orders of the defined types were placed in this encounter.   Orders Placed This Encounter  Procedures   DG Chest 2 View    Standing Status:   Future    Number of Occurrences:   1    Standing Expiration Date:   12/26/2023    Order Specific Question:   Reason for Exam (SYMPTOM  OR DIAGNOSIS REQUIRED)    Answer:   pleural effusion s/p thoracentesis last week, dyspnea    Order Specific Question:   Preferred imaging location?    Answer:   [facility]  TSH   T3   T4, free   T3, reverse   CBC with Differential/Platelet   Hemoglobin A1c   CK   POCT Urinalysis Dipstick (Automated)   EKG 12-Lead    Patient Instructions  EKG today Chest ray today  Labs today and urinalysis today.  If worsening symptoms, go to the ER for further evaluation. Call cardiology to schedule sooner appointment.  Hold metformin.  Hold lasix for now until labs return.  Follow up plan: No follow-ups on file.  [name], MD

## 2022-12-26 NOTE — Patient Instructions (Addendum)
EKG today Chest ray today  Labs today and urinalysis today.  If worsening symptoms, go to the ER for further evaluation. Call cardiology to schedule sooner appointment.  Hold metformin.  Hold lasix for now until labs return.

## 2022-12-27 ENCOUNTER — Other Ambulatory Visit (INDEPENDENT_AMBULATORY_CARE_PROVIDER_SITE_OTHER): Payer: Medicare PPO

## 2022-12-27 ENCOUNTER — Encounter: Payer: Self-pay | Admitting: Family Medicine

## 2022-12-27 ENCOUNTER — Ambulatory Visit: Payer: Medicare PPO | Admitting: Family Medicine

## 2022-12-27 DIAGNOSIS — R7989 Other specified abnormal findings of blood chemistry: Secondary | ICD-10-CM | POA: Insufficient documentation

## 2022-12-27 DIAGNOSIS — N189 Chronic kidney disease, unspecified: Secondary | ICD-10-CM

## 2022-12-27 DIAGNOSIS — E038 Other specified hypothyroidism: Secondary | ICD-10-CM | POA: Insufficient documentation

## 2022-12-27 LAB — RENAL FUNCTION PANEL
Albumin: 2.9 g/dL — ABNORMAL LOW (ref 3.5–5.2)
BUN: 58 mg/dL — ABNORMAL HIGH (ref 6–23)
CO2: 23 mEq/L (ref 19–32)
Calcium: 8.8 mg/dL (ref 8.4–10.5)
Chloride: 101 mEq/L (ref 96–112)
Creatinine, Ser: 1.34 mg/dL (ref 0.40–1.50)
GFR: 50.03 mL/min — ABNORMAL LOW (ref >60.00)
Glucose, Bld: 117 mg/dL — ABNORMAL HIGH (ref 70–99)
Phosphorus: 3.8 mg/dL (ref 2.3–4.6)
Potassium: 5.1 mEq/L (ref 3.5–5.1)
Sodium: 137 mEq/L (ref 135–145)

## 2022-12-27 NOTE — Assessment & Plan Note (Signed)
Anticipate due to sick euthyroid - update TFTs including reverse T3 today.

## 2022-12-27 NOTE — Assessment & Plan Note (Signed)
Sounds irregular, but EKG consistent with frequent PVCs not afib.  He continues eliquis, toprol XL .

## 2022-12-28 ENCOUNTER — Emergency Department: Payer: Medicare PPO

## 2022-12-28 ENCOUNTER — Inpatient Hospital Stay
Admission: EM | Admit: 2022-12-28 | Discharge: 2023-01-05 | DRG: 441 | Disposition: A | Discharge status: Skilled Nursing Facility | Payer: Medicare PPO | Source: Ambulatory Visit | Attending: Internal Medicine | Admitting: Internal Medicine

## 2022-12-28 ENCOUNTER — Encounter: Payer: Self-pay | Admitting: Internal Medicine

## 2022-12-28 ENCOUNTER — Other Ambulatory Visit: Payer: Self-pay

## 2022-12-28 ENCOUNTER — Ambulatory Visit: Payer: Medicare PPO | Attending: Internal Medicine | Admitting: Internal Medicine

## 2022-12-28 ENCOUNTER — Other Ambulatory Visit (INDEPENDENT_AMBULATORY_CARE_PROVIDER_SITE_OTHER): Payer: Medicare PPO

## 2022-12-28 VITALS — BP 98/50 | HR 87 | Ht 70.5 in | Wt 217.0 lb

## 2022-12-28 DIAGNOSIS — K750 Abscess of liver: Principal | ICD-10-CM | POA: Diagnosis present

## 2022-12-28 DIAGNOSIS — Z8249 Family history of ischemic heart disease and other diseases of the circulatory system: Secondary | ICD-10-CM

## 2022-12-28 DIAGNOSIS — Z6830 Body mass index (BMI) 30.0-30.9, adult: Secondary | ICD-10-CM | POA: Diagnosis present

## 2022-12-28 DIAGNOSIS — I358 Other nonrheumatic aortic valve disorders: Secondary | ICD-10-CM

## 2022-12-28 DIAGNOSIS — I5022 Chronic systolic (congestive) heart failure: Secondary | ICD-10-CM

## 2022-12-28 DIAGNOSIS — D75838 Other thrombocytosis: Secondary | ICD-10-CM | POA: Diagnosis present

## 2022-12-28 DIAGNOSIS — K219 Gastro-esophageal reflux disease without esophagitis: Secondary | ICD-10-CM | POA: Diagnosis not present

## 2022-12-28 DIAGNOSIS — Z8619 Personal history of other infectious and parasitic diseases: Secondary | ICD-10-CM

## 2022-12-28 DIAGNOSIS — I38 Endocarditis, valve unspecified: Secondary | ICD-10-CM | POA: Diagnosis not present

## 2022-12-28 DIAGNOSIS — M6281 Muscle weakness (generalized): Secondary | ICD-10-CM | POA: Diagnosis not present

## 2022-12-28 DIAGNOSIS — L89312 Pressure ulcer of right buttock, stage 2: Secondary | ICD-10-CM | POA: Diagnosis not present

## 2022-12-28 DIAGNOSIS — I480 Paroxysmal atrial fibrillation: Secondary | ICD-10-CM | POA: Diagnosis present

## 2022-12-28 DIAGNOSIS — R160 Hepatomegaly, not elsewhere classified: Secondary | ICD-10-CM

## 2022-12-28 DIAGNOSIS — E1122 Type 2 diabetes mellitus with diabetic chronic kidney disease: Secondary | ICD-10-CM | POA: Diagnosis present

## 2022-12-28 DIAGNOSIS — Z961 Presence of intraocular lens: Secondary | ICD-10-CM | POA: Diagnosis present

## 2022-12-28 DIAGNOSIS — N1832 Chronic kidney disease, stage 3b: Secondary | ICD-10-CM | POA: Diagnosis present

## 2022-12-28 DIAGNOSIS — E669 Obesity, unspecified: Secondary | ICD-10-CM | POA: Diagnosis present

## 2022-12-28 DIAGNOSIS — Z87898 Personal history of other specified conditions: Secondary | ICD-10-CM

## 2022-12-28 DIAGNOSIS — B9620 Unspecified Escherichia coli [E. coli] as the cause of diseases classified elsewhere: Secondary | ICD-10-CM | POA: Diagnosis present

## 2022-12-28 DIAGNOSIS — E66811 Obesity, class 1: Secondary | ICD-10-CM | POA: Diagnosis present

## 2022-12-28 DIAGNOSIS — I959 Hypotension, unspecified: Secondary | ICD-10-CM | POA: Diagnosis not present

## 2022-12-28 DIAGNOSIS — I10 Essential (primary) hypertension: Secondary | ICD-10-CM | POA: Diagnosis present

## 2022-12-28 DIAGNOSIS — I080 Rheumatic disorders of both mitral and aortic valves: Secondary | ICD-10-CM | POA: Diagnosis present

## 2022-12-28 DIAGNOSIS — R0609 Other forms of dyspnea: Secondary | ICD-10-CM | POA: Diagnosis not present

## 2022-12-28 DIAGNOSIS — I350 Nonrheumatic aortic (valve) stenosis: Secondary | ICD-10-CM | POA: Diagnosis not present

## 2022-12-28 DIAGNOSIS — Z9841 Cataract extraction status, right eye: Secondary | ICD-10-CM

## 2022-12-28 DIAGNOSIS — Z8679 Personal history of other diseases of the circulatory system: Secondary | ICD-10-CM | POA: Diagnosis not present

## 2022-12-28 DIAGNOSIS — I2510 Atherosclerotic heart disease of native coronary artery without angina pectoris: Secondary | ICD-10-CM | POA: Diagnosis not present

## 2022-12-28 DIAGNOSIS — D631 Anemia in chronic kidney disease: Secondary | ICD-10-CM | POA: Diagnosis not present

## 2022-12-28 DIAGNOSIS — Z87442 Personal history of urinary calculi: Secondary | ICD-10-CM

## 2022-12-28 DIAGNOSIS — Z8701 Personal history of pneumonia (recurrent): Secondary | ICD-10-CM

## 2022-12-28 DIAGNOSIS — R531 Weakness: Secondary | ICD-10-CM | POA: Diagnosis not present

## 2022-12-28 DIAGNOSIS — R5381 Other malaise: Secondary | ICD-10-CM | POA: Diagnosis not present

## 2022-12-28 DIAGNOSIS — M6259 Muscle wasting and atrophy, not elsewhere classified, multiple sites: Secondary | ICD-10-CM | POA: Diagnosis not present

## 2022-12-28 DIAGNOSIS — Z83719 Family history of colon polyps, unspecified: Secondary | ICD-10-CM

## 2022-12-28 DIAGNOSIS — J90 Pleural effusion, not elsewhere classified: Secondary | ICD-10-CM | POA: Diagnosis not present

## 2022-12-28 DIAGNOSIS — N400 Enlarged prostate without lower urinary tract symptoms: Secondary | ICD-10-CM | POA: Diagnosis present

## 2022-12-28 DIAGNOSIS — N281 Cyst of kidney, acquired: Secondary | ICD-10-CM | POA: Diagnosis not present

## 2022-12-28 DIAGNOSIS — Z9842 Cataract extraction status, left eye: Secondary | ICD-10-CM

## 2022-12-28 DIAGNOSIS — I700 Atherosclerosis of aorta: Secondary | ICD-10-CM | POA: Diagnosis present

## 2022-12-28 DIAGNOSIS — I5032 Chronic diastolic (congestive) heart failure: Secondary | ICD-10-CM | POA: Diagnosis not present

## 2022-12-28 DIAGNOSIS — Z951 Presence of aortocoronary bypass graft: Secondary | ICD-10-CM

## 2022-12-28 DIAGNOSIS — R350 Frequency of micturition: Secondary | ICD-10-CM | POA: Diagnosis not present

## 2022-12-28 DIAGNOSIS — R0602 Shortness of breath: Secondary | ICD-10-CM | POA: Diagnosis present

## 2022-12-28 DIAGNOSIS — R791 Abnormal coagulation profile: Secondary | ICD-10-CM | POA: Diagnosis present

## 2022-12-28 DIAGNOSIS — Z7984 Long term (current) use of oral hypoglycemic drugs: Secondary | ICD-10-CM

## 2022-12-28 DIAGNOSIS — E872 Acidosis, unspecified: Secondary | ICD-10-CM | POA: Insufficient documentation

## 2022-12-28 DIAGNOSIS — Z7901 Long term (current) use of anticoagulants: Secondary | ICD-10-CM | POA: Diagnosis not present

## 2022-12-28 DIAGNOSIS — R627 Adult failure to thrive: Secondary | ICD-10-CM | POA: Diagnosis present

## 2022-12-28 DIAGNOSIS — Z79899 Other long term (current) drug therapy: Secondary | ICD-10-CM

## 2022-12-28 DIAGNOSIS — E43 Unspecified severe protein-calorie malnutrition: Secondary | ICD-10-CM | POA: Diagnosis not present

## 2022-12-28 DIAGNOSIS — Z66 Do not resuscitate: Secondary | ICD-10-CM | POA: Diagnosis not present

## 2022-12-28 DIAGNOSIS — Z4803 Encounter for change or removal of drains: Secondary | ICD-10-CM | POA: Diagnosis not present

## 2022-12-28 DIAGNOSIS — I130 Hypertensive heart and chronic kidney disease with heart failure and stage 1 through stage 4 chronic kidney disease, or unspecified chronic kidney disease: Secondary | ICD-10-CM | POA: Diagnosis present

## 2022-12-28 DIAGNOSIS — I429 Cardiomyopathy, unspecified: Secondary | ICD-10-CM | POA: Diagnosis present

## 2022-12-28 DIAGNOSIS — R519 Headache, unspecified: Secondary | ICD-10-CM | POA: Diagnosis not present

## 2022-12-28 DIAGNOSIS — Z86010 Personal history of colonic polyps: Secondary | ICD-10-CM

## 2022-12-28 DIAGNOSIS — L89311 Pressure ulcer of right buttock, stage 1: Secondary | ICD-10-CM | POA: Insufficient documentation

## 2022-12-28 DIAGNOSIS — Z833 Family history of diabetes mellitus: Secondary | ICD-10-CM

## 2022-12-28 DIAGNOSIS — F17220 Nicotine dependence, chewing tobacco, uncomplicated: Secondary | ICD-10-CM | POA: Diagnosis present

## 2022-12-28 DIAGNOSIS — N401 Enlarged prostate with lower urinary tract symptoms: Secondary | ICD-10-CM | POA: Diagnosis not present

## 2022-12-28 DIAGNOSIS — E785 Hyperlipidemia, unspecified: Secondary | ICD-10-CM | POA: Diagnosis present

## 2022-12-28 DIAGNOSIS — M1990 Unspecified osteoarthritis, unspecified site: Secondary | ICD-10-CM | POA: Diagnosis present

## 2022-12-28 DIAGNOSIS — R7301 Impaired fasting glucose: Secondary | ICD-10-CM | POA: Insufficient documentation

## 2022-12-28 DIAGNOSIS — Z7401 Bed confinement status: Secondary | ICD-10-CM | POA: Diagnosis not present

## 2022-12-28 DIAGNOSIS — F40240 Claustrophobia: Secondary | ICD-10-CM | POA: Diagnosis present

## 2022-12-28 DIAGNOSIS — Z741 Need for assistance with personal care: Secondary | ICD-10-CM | POA: Diagnosis not present

## 2022-12-28 DIAGNOSIS — J9811 Atelectasis: Secondary | ICD-10-CM | POA: Diagnosis not present

## 2022-12-28 DIAGNOSIS — E663 Overweight: Secondary | ICD-10-CM | POA: Diagnosis present

## 2022-12-28 DIAGNOSIS — N200 Calculus of kidney: Secondary | ICD-10-CM | POA: Diagnosis not present

## 2022-12-28 DIAGNOSIS — I4891 Unspecified atrial fibrillation: Secondary | ICD-10-CM | POA: Diagnosis present

## 2022-12-28 LAB — BASIC METABOLIC PANEL WITH GFR
Anion gap: 9 (ref 5–15)
BUN: 41 mg/dL — ABNORMAL HIGH (ref 8–23)
CO2: 24 mmol/L (ref 22–32)
Calcium: 8.8 mg/dL — ABNORMAL LOW (ref 8.9–10.3)
Chloride: 103 mmol/L (ref 98–111)
Creatinine, Ser: 1.17 mg/dL (ref 0.61–1.24)
GFR, Estimated: >60 mL/min
Glucose, Bld: 136 mg/dL — ABNORMAL HIGH (ref 70–99)
Potassium: 4.4 mmol/L (ref 3.5–5.1)
Sodium: 136 mmol/L (ref 135–145)

## 2022-12-28 LAB — HEPATIC FUNCTION PANEL
ALT: 15 U/L (ref 0–44)
ALT: 11 U/L (ref 0–53)
AST: 32 U/L (ref 15–41)
AST: 26 U/L (ref 0–37)
Albumin: 2.5 g/dL — ABNORMAL LOW (ref 3.5–5.0)
Albumin: 2.9 g/dL — ABNORMAL LOW (ref 3.5–5.2)
Alkaline Phosphatase: 65 U/L (ref 38–126)
Alkaline Phosphatase: 74 U/L (ref 39–117)
Bilirubin, Direct: 0.2 mg/dL (ref 0.0–0.2)
Bilirubin, Direct: 0.2 mg/dL (ref 0.0–0.3)
Indirect Bilirubin: 0.4 mg/dL (ref 0.3–0.9)
Total Bilirubin: 0.6 mg/dL (ref 0.3–1.2)
Total Bilirubin: 0.4 mg/dL (ref 0.2–1.2)
Total Protein: 6.8 g/dL (ref 6.5–8.1)
Total Protein: 6.1 g/dL (ref 6.0–8.3)

## 2022-12-28 LAB — BRAIN NATRIURETIC PEPTIDE: B Natriuretic Peptide: 286.2 pg/mL — ABNORMAL HIGH (ref 0.0–100.0)

## 2022-12-28 LAB — CBC
HCT: 38.6 % — ABNORMAL LOW (ref 39.0–52.0)
Hemoglobin: 12.1 g/dL — ABNORMAL LOW (ref 13.0–17.0)
MCH: 27.1 pg (ref 26.0–34.0)
MCHC: 31.3 g/dL (ref 30.0–36.0)
MCV: 86.5 fL (ref 80.0–100.0)
Platelets: 627 10*3/uL — ABNORMAL HIGH (ref 150–400)
RBC: 4.46 MIL/uL (ref 4.22–5.81)
RDW: 16 % — ABNORMAL HIGH (ref 11.5–15.5)
WBC: 10.6 10*3/uL — ABNORMAL HIGH (ref 4.0–10.5)
nRBC: 0 % (ref 0.0–0.2)

## 2022-12-28 LAB — LACTIC ACID, PLASMA: Lactic Acid, Venous: 1.2 mmol/L (ref 0.5–1.9)

## 2022-12-28 LAB — TROPONIN I (HIGH SENSITIVITY): Troponin I (High Sensitivity): 7 ng/L

## 2022-12-28 MED ORDER — ACETAMINOPHEN 650 MG RE SUPP: 650.0000 mg | Freq: Four times a day (QID) | RECTAL | Status: AC | PRN

## 2022-12-28 MED ORDER — ONDANSETRON HCL 4 MG/2ML IJ SOLN: 4.0000 mg | Freq: Four times a day (QID) | INTRAMUSCULAR | Status: AC | PRN

## 2022-12-28 MED ORDER — HYDRALAZINE HCL 20 MG/ML IJ SOLN: 5.0000 mg | Freq: Three times a day (TID) | INTRAMUSCULAR | Status: AC | PRN

## 2022-12-28 MED ORDER — HEPARIN SODIUM (PORCINE) 5000 UNIT/ML IJ SOLN: 5000.0000 [IU] | Freq: Three times a day (TID) | INTRAMUSCULAR | Status: DC

## 2022-12-28 MED ORDER — ACETAMINOPHEN 325 MG PO TABS
650.0000 mg | ORAL_TABLET | Freq: Four times a day (QID) | ORAL | Status: AC | PRN
  Administered 2022-12-31 – 2023-01-01 (×2): 650 mg via ORAL
  Filled 2022-12-28 (×3): qty 2

## 2022-12-28 MED ORDER — IOHEXOL 350 MG/ML SOLN
100.0000 mL | Freq: Once | INTRAVENOUS | Status: AC | PRN
  Administered 2022-12-28: 100 mL via INTRAVENOUS

## 2022-12-28 MED ORDER — VANCOMYCIN HCL 1500 MG/300ML IV SOLN
1500.0000 mg | Freq: Once | INTRAVENOUS | Status: AC
  Administered 2022-12-28: 1500 mg via INTRAVENOUS
  Filled 2022-12-28: qty 300

## 2022-12-28 MED ORDER — METRONIDAZOLE 500 MG/100ML IV SOLN
500.0000 mg | Freq: Once | INTRAVENOUS | Status: DC
  Filled 2022-12-28: qty 100

## 2022-12-28 MED ORDER — ONDANSETRON HCL 4 MG PO TABS: 4.0000 mg | ORAL_TABLET | Freq: Four times a day (QID) | ORAL | Status: AC | PRN

## 2022-12-28 MED ORDER — VITAMIN D 25 MCG (1000 UNIT) PO TABS
1000.0000 [IU] | ORAL_TABLET | Freq: Every day | ORAL | Status: DC
  Administered 2022-12-29 – 2023-01-05 (×8): 1000 [IU] via ORAL
  Filled 2022-12-28 (×11): qty 1

## 2022-12-28 MED ORDER — METRONIDAZOLE 500 MG/100ML IV SOLN
500.0000 mg | Freq: Two times a day (BID) | INTRAVENOUS | Status: DC
  Administered 2022-12-28 – 2022-12-29 (×2): 500 mg via INTRAVENOUS
  Filled 2022-12-28 (×2): qty 100

## 2022-12-28 MED ORDER — SENNOSIDES-DOCUSATE SODIUM 8.6-50 MG PO TABS: 1.0000 | ORAL_TABLET | Freq: Every evening | ORAL | Status: DC | PRN

## 2022-12-28 MED ORDER — VANCOMYCIN HCL IN DEXTROSE 1-5 GM/200ML-% IV SOLN
1000.0000 mg | Freq: Once | INTRAVENOUS | Status: AC
  Administered 2022-12-28: 1000 mg via INTRAVENOUS
  Filled 2022-12-28: qty 200

## 2022-12-28 MED ORDER — LACTATED RINGERS IV BOLUS
1000.0000 mL | Freq: Once | INTRAVENOUS | Status: AC
  Administered 2022-12-28: 1000 mL via INTRAVENOUS

## 2022-12-28 MED ORDER — NITROGLYCERIN 0.4 MG SL SUBL: 0.4000 mg | SUBLINGUAL_TABLET | SUBLINGUAL | Status: DC | PRN

## 2022-12-28 MED ORDER — VANCOMYCIN HCL 1250 MG/250ML IV SOLN: 1250.0000 mg | INTRAVENOUS | Status: DC

## 2022-12-28 MED ORDER — SODIUM CHLORIDE 0.9 % IV SOLN
2.0000 g | Freq: Three times a day (TID) | INTRAVENOUS | Status: DC
  Administered 2022-12-28 – 2022-12-29 (×2): 2 g via INTRAVENOUS
  Filled 2022-12-28 (×2): qty 12.5

## 2022-12-28 MED ORDER — PANTOPRAZOLE SODIUM 40 MG PO TBEC
80.0000 mg | DELAYED_RELEASE_TABLET | Freq: Every day | ORAL | Status: DC
  Administered 2022-12-28 – 2023-01-05 (×9): 80 mg via ORAL
  Filled 2022-12-28 (×10): qty 2

## 2022-12-28 MED ORDER — SODIUM CHLORIDE 0.9 % IV SOLN: INTRAVENOUS | Status: AC

## 2022-12-28 MED ORDER — SODIUM CHLORIDE 0.9 % IV SOLN
2.0000 g | Freq: Once | INTRAVENOUS | Status: AC
  Administered 2022-12-28: 2 g via INTRAVENOUS
  Filled 2022-12-28: qty 12.5

## 2022-12-28 NOTE — Consult Note (Signed)
Pharmacy Antibiotic Note  [name] Young. is a 82 y.o. male admitted on 12/28/2022 with sepsis and hx of endocarditis.  Pharmacy has been consulted for cefepime and vancomycin dosing.  Plan: Start cefepime 2 g TID. CrCl borderline.   Pt received vancomycin 1000 mg x 1. Will give vancomycin 1500 mg x 1 followed by 1250 mg q24H. Predicted AUC of 534. Goal AUC of 400-550. Plan to obtain vancomycin level after 4th or 5th dose?      Temp (24hrs), Avg:97.8 F (36.6 C), Min:97.8 F (36.6 C), Max:97.8 F (36.6 C)  Recent Labs  Lab 12/26/22 1405 12/27/22 0807 12/28/22 1135  WBC 10.2  --  10.6*  CREATININE  --  1.34 1.17    Estimated Creatinine Clearance: 59.8 mL/min (by C-G formula based on SCr of 1.17 mg/dL).    No Known Allergies  Antimicrobials this admission: 4/18 cefepime >>  4/18 vancomycin >>   Dose adjustments this admission: None  Microbiology results: 4/18 BCx: pending  Thank you for allowing pharmacy to be a part of this patient's care.  Ronnald Ramp, PharmD, BCPS 12/28/2022 3:56 PM

## 2022-12-28 NOTE — Assessment & Plan Note (Deleted)
Hydralazine 5 mg IV every 8 hours as needed for SBP greater than 175, 4 days ordered 

## 2022-12-28 NOTE — Assessment & Plan Note (Addendum)
IR drain of abscess removed 550 mL of purulent fluid.  E. coli growing out of the cultures.  Now on IV Rocephin and will continue Rocephin through 01/25/2023.  Remove PICC line after completion of antibiotics.  CBC and BMP weekly while on antibiotics.  Interventional radiology to monitor the drain.  Follow-up with interventional radiology team in 10 to 14 days.  Staff at the rehab to empty the drain every eight hours.  Flush with Picc  with 5 mL normal saline every 8 hours and as needed.

## 2022-12-28 NOTE — Patient Instructions (Signed)
Medication Instructions:  Your Physician recommend you continue on your current medication as directed.    *If you need a refill on your cardiac medications before your next appointment, please call your pharmacy*   Lab Work: None ordered today   Testing/Procedures: None ordered today   Follow-Up: At [facility], you and your health needs are our priority.  As part of our continuing mission to provide you with exceptional heart care, we have created designated Provider Care Teams.  These Care Teams include your primary Cardiologist (physician) and Advanced Practice Providers (APPs -  Physician Assistants and Nurse Practitioners) who all work together to provide you with the care you need, when you need it.  We recommend signing up for the patient portal called "MyChart".  Sign up information is provided on this After Visit Summary.  MyChart is used to connect with patients for Virtual Visits (Telemedicine).  Patients are able to view lab/test results, encounter notes, upcoming appointments, etc.  Non-urgent messages can be sent to your provider as well.   To learn more about what you can do with MyChart, go to [email or web address].    Your next appointment:   Based on hospitalization   Provider:   You may see Yvonne Kendall, MD or one of the following Advanced Practice Providers on your designated Care Team:   Nicolasa Ducking, NP Eula Listen, PA-C Cadence Fransico Michael, PA-C Charlsie Quest, NP

## 2022-12-28 NOTE — Assessment & Plan Note (Addendum)
Predominant issue, as of yet unclear etiology. Discussed safest place today is ER for expedited evaluation, however he is adamant about avoiding hospitalization at this time.  Check CXR, EKG, labs and urinalysis today.  Hold metformin, lasix for now.

## 2022-12-28 NOTE — Assessment & Plan Note (Addendum)
-   PPI resumed °

## 2022-12-28 NOTE — Assessment & Plan Note (Deleted)
Etiology workup in progress, differentials include heart failure exacerbation Given patient history of bacteremia with endocarditis, complete echo has been ordered Strict I's and O's Will initiate appropriate antibiotic to cover for endocarditis: Cefepime and vancomycin Blood cultures x 2 are in process

## 2022-12-28 NOTE — Assessment & Plan Note (Signed)
Check EKG today - frequent PVCs.  Continue Toprol XL  daily

## 2022-12-28 NOTE — Progress Notes (Addendum)
Elink monitoring for the code sepsis protocol.  

## 2022-12-28 NOTE — ED Provider Notes (Signed)
[facility] Provider Note    Event Date/Time   First MD Initiated Contact with Patient 12/28/22 1230     (approximate)   History   Shortness of Breath   HPI  [name] Young. is a 82 y.o. male  hree-vessel CAD status post CABG (01/2018) complicated by postoperative atrial fibrillation (transient atrial fibrillation recurred in the setting of sepsis related to ascending cholangitis), ascending cholangitis complicated by recurrent atrial fibrillation, acute HFrEF (LVEF 40-45%), and mitral valve endocarditis, as well as hypertension, hyperlipidemia, type 2 diabetes mellitus, and chronic kidney disease stage III , who presents for follow-up of coronary artery disease, cardiomyopathy, endocarditis, and atrial fibrillation who comes in for SOB.   Patient last had a thoracentesis on Thursday.  Patient reportedly cannot lie flat because is too weak.  Patient was seen at cardiology by Dr. Okey Dupre and recommended to come over here   Patient reports that since thoracentesis he has been more weak.  He reports increased shortness of breath with ambulation.  He states that he just feels like his legs are going to give out.  He denies any headaches, confusion, falls and hitting his head.  Denies any chest pain, abdominal pain.  He states that he saw cardiology today and they sent him here in the ER to be evaluated given they were not sure what was causing his symptoms.  They do report a weight loss of 8 pounds recently and just some decreased appetite.  Patient is on a blood thinner.  He does report some intermittent right upper abdominal discomfort.   Physical Exam   Triage Vital Signs: ED Triage Vitals  Enc Vitals Group     BP 12/28/22 1135 (!) 120/59     Pulse Rate 12/28/22 1135 83     Resp --      Temp 12/28/22 1135 97.8 F (36.6 C)     Temp Source 12/28/22 1135 Oral     SpO2 12/28/22 1135 95 %     Weight --      Height --      Head Circumference --      Peak Flow  --      Pain Score 12/28/22 1120 0     Pain Loc --      Pain Edu? --      Excl. in GC? --     Most recent vital signs: Vitals:   12/28/22 1135  BP: (!) 120/59  Pulse: 83  Temp: 97.8 F (36.6 C)  SpO2: 95%     General: Awake, no distress.  CV:  Good peripheral perfusion.  Resp:  Normal effort.  Abd:  No distention.  Soft nontender Other:  Able to lift both legs up off the bed   ED Results / Procedures / Treatments   Labs (all labs ordered are listed, but only abnormal results are displayed) Labs Reviewed  BASIC METABOLIC PANEL - Abnormal; Notable for the following components:      Result Value   Glucose, Bld 136 (*)    BUN 41 (*)    Calcium 8.8 (*)    All other components within normal limits  CBC - Abnormal; Notable for the following components:   WBC 10.6 (*)    Hemoglobin 12.1 (*)    HCT 38.6 (*)    RDW 16.0 (*)    Platelets 627 (*)    All other components within normal limits     EKG  My interpretation of EKG:  Normal sinus rate of 88 without any ST elevation or T wave inversions, occasional PVC  RADIOLOGY I have reviewed the xray personally and interpreted and patient has a 6 right-sided pleural effusion that is small   PROCEDURES:  Critical Care performed: No  .1-3 Lead EKG Interpretation  Performed by: Concha Se, MD Authorized by: Concha Se, MD     Interpretation: normal     ECG rate:  60   ECG rate assessment: normal     Rhythm: sinus rhythm     Ectopy: none   .Critical Care  Performed by: Concha Se, MD Authorized by: Concha Se, MD   Critical care provider statement:    Critical care time (minutes):  30   Critical care was necessary to treat or prevent imminent or life-threatening deterioration of the following conditions: hepatic abscess.   Critical care was time spent personally by me on the following activities:  Development of treatment plan with patient or surrogate, discussions with consultants, evaluation of  patient's response to treatment, examination of patient, ordering and review of laboratory studies, ordering and review of radiographic studies, ordering and performing treatments and interventions, pulse oximetry, re-evaluation of patient's condition and review of old charts    MEDICATIONS ORDERED IN ED: Medications  vancomycin (VANCOCIN) IVPB 1000 mg/200 mL premix (has no administration in time range)  acetaminophen (TYLENOL) tablet 650 mg (has no administration in time range)    Or  acetaminophen (TYLENOL) suppository 650 mg (has no administration in time range)  ondansetron (ZOFRAN) tablet 4 mg (has no administration in time range)    Or  ondansetron (ZOFRAN) injection 4 mg (has no administration in time range)  senna-docusate (Senokot-S) tablet 1 tablet (has no administration in time range)  metroNIDAZOLE (FLAGYL) IVPB 500 mg (500 mg Intravenous New Bag/Given 12/28/22 1619)  hydrALAZINE (APRESOLINE) injection 5 mg (has no administration in time range)  nitroGLYCERIN (NITROSTAT) SL tablet 0.4 mg (has no administration in time range)  pantoprazole (PROTONIX) EC tablet 80 mg (80 mg Oral Given 12/28/22 1619)  cholecalciferol (VITAMIN D3) tablet 1,000 Units (has no administration in time range)  ceFEPIme (MAXIPIME) 2 g in sodium chloride 0.9 % 100 mL IVPB (has no administration in time range)  vancomycin (VANCOREADY) IVPB 1500 mg/300 mL (has no administration in time range)    Followed by  vancomycin (VANCOREADY) IVPB 1250 mg/250 mL (has no administration in time range)  heparin injection 5,000 Units (has no administration in time range)  iohexol (OMNIPAQUE) 350 MG/ML injection 100 mL (100 mLs Intravenous Contrast Given 12/28/22 1412)  ceFEPIme (MAXIPIME) 2 g in sodium chloride 0.9 % 100 mL IVPB (0 g Intravenous Stopped 12/28/22 1609)     IMPRESSION / MDM / ASSESSMENT AND PLAN / ED COURSE  I reviewed the triage vital signs and the nursing notes.   Patient's presentation is most  consistent with acute presentation with potential threat to life or bodily function.   Patient comes in with shortness of breath, weakness vitals are stable.  I reviewed a prior CT imaging where he had a possible hepatic abscess previously.  He does report pain in the right upper quadrant.  Will get CT imaging to evaluate for potential causes for weakness.  CBC shows stable hemoglobin.  BMP stable   1:19 PM Discussed with Dr. Okey Dupre from cardiology who recommended admission for echocardiogram given his history of endocarditis recommended blood cultures and agreed with CT imaging to evaluate for any other cause for patient's weakness.  Patient CT scan shows some incidental findings discussed with patient but there is concern for potential liver abscess as well as pleural effusion I discussed the case with IR Dr Juliette Alcide and will see if they can tap these.  They state they should be able to do tomorrow.  He is not really septic but given the concern for abscess I placed on broad-spectrum antibiotics.  Going to hold off on fluid given his history of recurrent pleural effusions and CHF.  There is also concern for potential endocarditis and Dr. Okey Dupre want him to get an echo tomorrow.  Discussed case the hospitalist who will admit him to the hospital.   The patient is on the cardiac monitor to evaluate for evidence of arrhythmia and/or significant heart rate changes.      FINAL CLINICAL IMPRESSION(S) / ED DIAGNOSES   Final diagnoses:  Hepatic abscess  Pleural effusion     Rx / DC Orders   ED Discharge Orders     None        Note:  This document was prepared using Dragon voice recognition software and may include unintentional dictation errors.   Concha Se, MD 12/28/22 [phone]

## 2022-12-28 NOTE — ED Triage Notes (Addendum)
Pt comes from [facility] with worsening sob. Pt also states weakness. Pt had thorocentensis last week. And hx of endocarditis. Pt is on eliquis. No recent falls. Pt states he has lost a lot of weight also. Pt has labored breathing noted and appears a little pale.

## 2022-12-28 NOTE — Assessment & Plan Note (Signed)
Update A1c, hold metformin for now.

## 2022-12-28 NOTE — Progress Notes (Signed)
Follow-up Outpatient Visit Date: 12/28/2022  Primary Care Provider: [name], MD [address] [place] [address]  Chief Complaint: "I feel like I am dying"  HPI:  [name] is a 82 y.o. male with history of three-vessel CAD status post CABG (01/2018) complicated by postoperative atrial fibrillation (transient atrial fibrillation recurred in the setting of sepsis related to ascending cholangitis), ascending cholangitis complicated by recurrent atrial fibrillation, acute HFrEF (LVEF 40-45%), and mitral valve endocarditis, as well as hypertension, hyperlipidemia, type 2 diabetes mellitus, and chronic kidney disease stage III , who presents for follow-up of coronary artery disease, cardiomyopathy, endocarditis, and atrial fibrillation.  He was last seen in our office in January by Wallis Bamberg, NP, at which time he reported feeling "no worse and no better" than at his last visit with me in 03/2022.  He was concerned about continued leg weakness and cough.  No medication changes or additional testing were pursued.  Today, Mr. Faucett reports that he is feeling very poorly.  He has not felt great for some time but has felt particularly short of breath and weak following thoracentesis last week.  He is now barely able to walk 10 feet without having to stop due to to shortness of breath.  He even gets out of breath sitting still sometimes.  He has not had any substernal chest pain but notes some discomfort near the site of his right thoracentesis.  His wife notes that Mr. Jafri also recently had some right upper quadrant pain.  He denies nausea but reports that his appetite has been poor and that he has thrown up at least twice in the last week.  He has not had any fevers or chills.  He denies dysuria and increased urinary frequency.  He has not been lying down in his bed because he is too weak to get up.  He saw Dr. [name] 2 days ago; he was advised to go to the ED at that time  but declined.  Chest radiograph showed decreased size of right pleural effusion without pneumothorax or other significant abnormality.  Other than mild anemia, no significant abnormalities were seen on lab work.  He has lost quite a bit of weight over the last month or two.  --------------------------------------------------------------------------------------------------  Past Medical History:  Diagnosis Date   Adenomatous colon polyp    Aortic atherosclerosis    Arthritis    Ascending aorta dilatation    a.) TTE 11/23/2017: asc Ao measured 37 mm; b.) TTE 10/23/2021: Ao root measured 41 mm, asc Ao measured 38 mm; c.) TEE 10/28/2021: asc Ao measured 38 mm; d.) TTE 01/11/2022: Ao root 40 mm, asc Ao 39 mm   Ascending cholangitis 10/2021   BPH (benign prostatic hyperplasia)    Cardiomyopathy    CKD (chronic kidney disease), stage III    Claustrophobia    Coronary artery disease    a.) LHC 12/21/2017: 75% mLAD, 90% D1, 80-99% OM1/2, CTO pRCA (L-R collaterals) --> CVTS consult. 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   In setting of bacteremia from ascending colangitis   GERD (gastroesophageal reflux disease)    HFrEF (heart failure with reduced ejection fraction)    a.) TTE 11/23/2017: EF 50-55%, mod MAC, triv TR, G1DD; b.) TTE 10/23/2021: EF 40-45%, mild LAE, Ao sclerosis, triv MR, G2DD; c.) TEE 10/28/2021: EF 40-45%, glob HK, mobile vegitation on MV; d.) TTE 01/11/2022: EF 50-55%, mild LVH,  RVE, Ao sclerosis, mild MR/AR, G1DD   History of cholelithiasis    History of kidney stones    ca ox Vonita Moss @ [facility]) now [employer]   History of pneumonia    HLD (hyperlipidemia)    HTN (hypertension)    Jaundice    age 89   Long term current use of anticoagulant    a.) apixaban   Paroxysmal atrial fibrillation    a.) CHA2DS2VASc = 6 (age x 2, HFrEF, HTN, vascular disease history, T2DM);  b.) rate/rhythm maintained on oral metoprolol  succinate; chronically anticoagulated with apixaban   Pneumonia    Right-sided carotid artery disease    a.) carotid doppler 04/05/2022: 1-39% RICA   S/P CABG x 3    a.) LIMA-LAD, SVG-diagonal, SVG-PL branch of RCA   S/P cataract extraction and insertion of intraocular lens    T2DM (type 2 diabetes mellitus) 2010   Past Surgical History:  Procedure Laterality Date   CARPAL TUNNEL RELEASE Bilateral    CATARACT EXTRACTION, BILATERAL     COLONOSCOPY  11/2012   12 adenomatous polyps, diverticulosis, rec rpt 1 yr [name])   COLONOSCOPY  12/2013   3 polyps, diverticulosis, rec rpt 3 yrs [name])   COLONOSCOPY  06/2019   6 polyps (TA), diverticulosis, f/u left open ended [name])   CORONARY ARTERY BYPASS GRAFT N/A 01/18/2018   Procedure: CORONARY ARTERY BYPASS GRAFTING (CABG) x 3; Using Left Internal Mammary Artery, and Right Greater Saphenous Vein harvested Endoscopically, Coronary Artery Endarterectomy;  Surgeon: Kerin Perna, MD;  Location: [facility] OR;  Service: Open Heart Surgery;  Laterality: N/A;   ERCP N/A 10/23/2021   Procedure: ENDOSCOPIC RETROGRADE CHOLANGIOPANCREATOGRAPHY (ERCP);  Surgeon: Meryl Dare, MD;  Location: [facility] ENDOSCOPY;  Service: Endoscopy;  Laterality: N/A;   KNEE CARTILAGE SURGERY Left    LEFT HEART CATH AND CORONARY ANGIOGRAPHY N/A 12/21/2017   Procedure: LEFT HEART CATH AND CORONARY ANGIOGRAPHY;  Surgeon: Yvonne Kendall, MD;  Location: [facility] INVASIVE CV LAB;  Service: Cardiovascular;  Laterality: N/A;   LITHOTRIPSY     REMOVAL OF STONES  10/23/2021   Procedure: REMOVAL OF STONES;  Surgeon: Meryl Dare, MD;  Location: [facility] ENDOSCOPY;  Service: Endoscopy;;   SPHINCTEROTOMY  10/23/2021   Procedure: Dennison Mascot;  Surgeon: Meryl Dare, MD;  Location: [facility] ENDOSCOPY;  Service: Endoscopy;;   TEE WITHOUT CARDIOVERSION N/A 01/18/2018   Procedure: TRANSESOPHAGEAL ECHOCARDIOGRAM (TEE);  Surgeon: Donata Clay, Theron Arista, MD;  Location: [facility] OR;  Service: Open Heart Surgery;   Laterality: N/A;   TEE WITHOUT CARDIOVERSION N/A 10/28/2021   Procedure: TRANSESOPHAGEAL ECHOCARDIOGRAM (TEE);  Surgeon: Chrystie Nose, MD;  Location: [facility] ENDOSCOPY;  Service: Cardiovascular;  Laterality: N/A;   UMBILICAL HERNIA REPAIR     with mesh    Current Meds  Medication Sig   apixaban (ELIQUIS) 5 MG TABS tablet TAKE 1 TABLET BY MOUTH TWICE A DAY   Cholecalciferol (VITAMIN D3) 25 MCG (1000 UT) CAPS Take 1 capsule (1,000 Units total) by mouth daily.   Cyanocobalamin (B-12) 1000 MCG CAPS Take by mouth daily at 2 am.   ENTRESTO 24-26 MG TAKE 1 TABLET BY MOUTH TWICE A DAY   fenofibrate (TRICOR) 145 MG tablet TAKE 1 TABLET BY MOUTH EVERY DAY   metoprolol succinate (TOPROL-XL) 50 MG 24 hr tablet TAKE 1 TABLET BY MOUTH DAILY. TAKE WITH OR IMMEDIATELY FOLLOWING A MEAL.   Multiple Vitamins-Minerals (MACULAR VITAMIN BENEFIT PO) Take 1 tablet by mouth daily.   nitroGLYCERIN (NITROSTAT) 0.4 MG SL tablet Place  1 tablet (0.4 mg total) under the tongue every 5 (five) minutes as needed for chest pain.   Omega-3 Fatty Acids (FISH OIL) 1200 MG CAPS Take 2 capsules (2,400 mg total) by mouth daily.   omeprazole (PRILOSEC) 40 MG capsule TAKE 1 CAPSULE BY MOUTH EVERY MONDAY,WEDNESDAY, AND FRIDAY   rosuvastatin (CRESTOR) 10 MG tablet TAKE 1 TABLET BY MOUTH EVERY DAY   tamsulosin (FLOMAX) 0.4 MG CAPS capsule TAKE 1 CAPSULE BY MOUTH EVERY DAY    Allergies: Patient has no known allergies.  Social History   Tobacco Use   Smoking status: Never    Passive exposure: Past   Smokeless tobacco: Former    Types: Chew    Quit date: 09/08/2001  Vaping Use   Vaping Use: Never used  Substance Use Topics   Alcohol use: Not Currently    Comment: occ   Drug use: No    Family History  Problem Relation Age of Onset   Alzheimer's disease Mother    Breast cancer Mother        breast   Atrial fibrillation Mother    CAD Mother    Stroke Father 82   Diabetes Father    CAD Father    Colon polyps Sister     Colon polyps Brother    Rectal cancer Maternal Grandfather        rectal   Colon cancer Maternal Grandfather 36   Esophageal cancer Neg Hx    Stomach cancer Neg Hx     Review of Systems: A 12-system review of systems was performed and was negative except as noted in the HPI.  --------------------------------------------------------------------------------------------------  Physical Exam: BP (!) 98/50 (BP Location: Left Arm, Patient Position: Sitting, Cuff Size: Normal)   Pulse 87   Ht 5' 10.5" (1.791 m)   Wt 217 lb (98.4 kg)   SpO2 98%   BMI 30.70 kg/m   General: Ill-appearing man, seated in wheelchair.  He is accompanied by his wife.. Neck: No JVD or HJR. Lungs: Mildly diminished sounds without wheezes or crackles. Heart: Regular rate and rhythm without murmurs, rubs, or gallops. Abdomen: Soft, nontender, nondistended. Extremities: Mottled parents with trace pretibial edema.  EKG: Normal sinus rhythm with borderline LVH and nonspecific ST segment changes.  Compared with prior tracing in our office from 09/15/2022, PVCs are no longer present.  Lab Results  Component Value Date   WBC 10.2 12/26/2022   HGB 12.0 (L) 12/26/2022   HCT 36.6 (L) 12/26/2022   MCV 84.3 12/26/2022   PLT 544.0 (H) 12/26/2022    Lab Results  Component Value Date   NA 137 12/27/2022   K 5.1 12/27/2022   CL 101 12/27/2022   CO2 23 12/27/2022   BUN 58 (H) 12/27/2022   CREATININE 1.34 12/27/2022   GLUCOSE 117 (H) 12/27/2022   ALT 4 12/21/2022    Lab Results  Component Value Date   CHOL 76 06/20/2022   HDL 19.50 (L) 06/20/2022   LDLCALC 29 06/20/2022   LDLDIRECT 66.0 06/07/2021   TRIG 133.0 06/20/2022   CHOLHDL 4 06/20/2022    --------------------------------------------------------------------------------------------------  ASSESSMENT AND PLAN: Shortness of breath and malaise: Mr. Lyon is notably unwell in appearance today.  He has lost a lot of weight since our last visit and  reports significant worsening of his dyspnea despite recent thoracentesis.  His legs appear mottled.  I am uncertain as to what has caused his marked decline but I am worried about underlying infection or worsening heart failure.  Given his medically treated endocarditis last year, there is potential for recurrent endocarditis.  I have recommended that Mr. Vieth go to the ER for further evaluation.  Blood cultures should be drawn and echocardiogram obtained.  Suspicion for PE is low given that Mr. Mimbs is on chronic anticoagulation.  However, cross-sectional imaging of the chest and abdomen would be helpful to further evaluate for potential sources of infection.  Coronary artery disease: Mr. Adora Fridge does not report any angina though I worry that his exertional dyspnea could be an anginal equivalent.  If aforementioned workup does not reveal any obvious cause for his symptoms, ischemia evaluation will need to be considered in the future.  Continue secondary prevention with apixaban and lieu of aspirin and rosuvastatin.  Chronic HFrEF: Mr. Mena dyspnea could be due to worsening heart failure though he does not appear significantly volume overloaded.  I think it would be prudent to check a BNP and lactate in the ED as well as obtain an echocardiogram.  Diuretic should be held at this time.  I would also suggest holding metoprolol and Entresto, at least temporarily, in the setting of soft blood pressure.  Paroxysmal atrial fibrillation: EKG today shows sinus rhythm.  Plan to continue anticoagulation with apixaban.  However, based on workup, this may need to be held temporarily in case invasive procedures are necessary.  Follow-up: To be determined based on hospital course.  Yvonne Kendall, MD 12/28/2022 10:47 AM

## 2022-12-28 NOTE — Assessment & Plan Note (Addendum)
Continue supplements.  Encouraged to eat.  Continue Remeron at night.

## 2022-12-28 NOTE — Assessment & Plan Note (Deleted)
Confirmed with patient at bedside and spouse

## 2022-12-28 NOTE — H&P (Signed)
History and Physical   [name] Young. UJW:[id number] DOB: [birth year] DOA: 12/28/2022  PCP: [name] Boyden, MD  Outpatient Specialists: Dr. Okey Young, cardiology Patient coming from: Home  I have personally briefly reviewed patient's old medical records in [facility] EMR.  Chief Concern: Shortness of breath and weakness  HPI: [name], [name] Young. 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  Vitals in the ED showed temperature 97.8, respiration rate of 22, heart rate 87, blood pressure 98/50, improved to 111/58, SpO2 98% on room air. Exam serum sodium is 136, potassium 4.4, chloride 103, bicarb 24, BUN of 41, serum creatinine 1.17, nonfasting blood glucose 136, WBC 10.6, hemoglobin 12.1, platelets of 627.  BNP was elevated at 286.2.  High sensitive troponin 7.  ED treatment: Cefepime 2 g IV, Flagyl 500 mg IV, vancomycin per pharmacy ordered by EDP. --------------------------- At bedside, patient was able to tell me his name, age, current calendar year, current location, and identify spouse at bedside. He does not appear to be in acute distress.   Patient does have mild/moderate bilateral temporal wasting.  At his outpatient cardiology clinic, he reports feeling "washed out "with generalized malaise, weakness, shortness of breath he states he has been having no energy.  He states shortness of breath is worse with exertion.  He denies chest pain, abdominal pain, dysuria, hematuria, nausea, vomiting, diarrhea.  He endorses no appetite for a very long  time. Spouse at bedside reports that on Thursday about 1 week ago, he weighed 225 and currently weighs 217.  He denies fever at home, new cough, swelling of his lower extremity, syncope, loss of consciousness.  Social history: He lives at home with his spouse. He denies tobacco, etoh, and recreational drug use.   ROS: Constitutional: no weight change, no fever ENT/Mouth: no sore throat, no rhinorrhea Eyes: no eye pain, no vision changes Cardiovascular: no chest pain, + dyspnea,  no edema, no palpitations Respiratory: no cough, no sputum, no wheezing Gastrointestinal: no nausea, no vomiting, no diarrhea, no constipation Genitourinary: no urinary incontinence, no dysuria, no hematuria Musculoskeletal: no arthralgias, no myalgias Skin: no skin lesions, no pruritus, Neuro: + weakness, no loss of consciousness, no syncope Psych: no anxiety, no depression, + decrease appetite Heme/Lymph: no bruising, no bleeding  ED Course: Discussed with emergency medicine provider, patient requiring hospitalization for chief concerns of shortness of breath setting of history of bacteremia and endocarditis.  Assessment/Plan  Principal Problem:   Shortness of breath Active Problems:   Essential hypertension   GERD (gastroesophageal reflux disease)   Benign prostatic hyperplasia   DNR (do not resuscitate)   S/P CABG x 3   FTT (failure to thrive) in adult   Atrial fibrillation   Chronic HFrEF (heart failure with reduced ejection fraction)   History of endocarditis   History of bacteremia   Hepatic abscess   Protein-calorie malnutrition, severe   Assessment and Plan:  * Shortness of breath Etiology workup in progress, differentials include heart failure exacerbation Given patient history of bacteremia with endocarditis, complete echo has been ordered Strict I's and O's Will initiate appropriate antibiotic to cover for endocarditis: Cefepime and vancomycin Blood  cultures x 2 are in  process  Protein-calorie malnutrition, severe Moderate to severe Consultation placed to dietitian for nutritional supplementation Encourage patient at bedside to eat colorful vegetables and fresh fruit along with healthy proteins including fish and chicken broth Patient endorses he understands this however he has no appetite I encouraged him to try to eat and chew so that he can get the nutrition to help his body heal Spouse at bedside is in agreement  Hepatic abscess Enlarging compared to prior CT abdomen pelvis per radiology read IR consulted for evaluation and management Metronidazole 500 mg IV twice daily and cefepime per pharmacy for intra-abdominal infection ordered  History of bacteremia History of E. coli and Streptococcus bacteremia  History of endocarditis History of Streptococcus bacteremia  Atrial fibrillation Eliquis not resumed on admission due to anticipation of percutaneous drain placement by IR AM team to resume when the benefits outweigh the risk  FTT (failure to thrive) in adult Registered dietitian consulted  DNR (do not resuscitate) Confirmed with patient at bedside and spouse  GERD (gastroesophageal reflux disease) PPI resumed  Essential hypertension Hydralazine 5 mg IV every 8 hours as needed for SBP greater than 175, 4 days ordered  Chart reviewed.   Hospitalization 10/23/2021 to 11/05/2021: Patient was admitted for sepsis and found to have bacteremia with Streptococcus infantarius and E. coli with confirmed mitral valve endocarditis.  TEE was done on 10/28/2021.  Infectious disease specialist was consulted.  PICC line placed on 10/29/2021.  The etiology was presumed secondary to ascending cholangitis and patient underwent ERCP with 2 gallbladder stone removal with sphincterotomy on 10/23/2021.  Patient was discharged for IV cefazolin to complete 6-week course end date on 12/05/2021.  Patient was also found to have acute kidney injury on CKD 3B presumed  secondary to ATN from sepsis.  Nephrology was consulted.  Cardiology was consulted.  Repeat complete echo on 01/11/2022: Mitral valve: Calcification/mass noted on both mitral valve leaflet tips consistent with prior endocarditis.  Mitral valve is degenerative in appearance.  There is moderate thickening of the anterior mitral valve leaflets. Estimated ejection fraction 50 to 55%.  Left ventricle has low normal function.  There is mild left ventricular hypertrophy.  Grade 1 diastolic dysfunction.    DVT prophylaxis: Heparin 5000 units subcutaneous at 2200, one-time dose ordered Code Status: DNR per MOST form and ACP Diet: Heart healthy; n.p.o. after midnight Family Communication: Updated [name] Young, spouse at bedside and daughter, [name] Young who is RN and healthcare power of attorney. Disposition Plan: Pending clinical course Consults called: IR Admission status: Telemetry cardiac, inpatient  Past Medical History:  Diagnosis Date   Adenomatous colon polyp    Aortic atherosclerosis    Arthritis    Ascending aorta dilatation    a.) TTE 11/23/2017: asc Ao measured 37 mm; b.) TTE 10/23/2021: Ao root measured 41 mm, asc Ao measured 38 mm; c.) TEE 10/28/2021: asc Ao measured 38 mm; d.) TTE 01/11/2022: Ao root 40 mm, asc Ao 39 mm   Ascending cholangitis 10/2021   BPH (benign prostatic hyperplasia)    Cardiomyopathy    CKD (chronic kidney disease), stage III    Claustrophobia    Coronary artery disease    a.) LHC 12/21/2017: 75% mLAD, 90% D1, 80-99% OM1/2, CTO pRCA (L-R collaterals) --> CVTS consult. 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   In setting of bacteremia from ascending colangitis   GERD (gastroesophageal reflux disease)  HFrEF (heart failure with reduced ejection fraction)    a.) TTE 11/23/2017: EF 50-55%, mod MAC, triv TR, G1DD; b.) TTE 10/23/2021: EF 40-45%, mild LAE, Ao sclerosis, triv MR, G2DD; c.) TEE 10/28/2021: EF  40-45%, glob HK, mobile vegitation on MV; d.) TTE 01/11/2022: EF 50-55%, mild LVH, RVE, Ao sclerosis, mild MR/AR, G1DD   History of cholelithiasis    History of kidney stones    ca ox Vonita Moss @ [facility]) now [employer] [facility]   History of pneumonia    HLD (hyperlipidemia)    HTN (hypertension)    Jaundice    age 38   Long term current use of anticoagulant    a.) apixaban   Paroxysmal atrial fibrillation    a.) CHA2DS2VASc = 6 (age x 2, HFrEF, HTN, vascular disease history, T2DM);  b.) rate/rhythm maintained on oral metoprolol succinate; chronically anticoagulated with apixaban   Pneumonia    Right-sided carotid artery disease    a.) carotid doppler 04/05/2022: 1-39% RICA   S/P CABG x 3    a.) LIMA-LAD, SVG-diagonal, SVG-PL branch of RCA   S/P cataract extraction and insertion of intraocular lens    T2DM (type 2 diabetes mellitus) 2010   Past Surgical History:  Procedure Laterality Date   CARPAL TUNNEL RELEASE Bilateral    CATARACT EXTRACTION, BILATERAL     COLONOSCOPY  11/2012   11 adenomatous polyps, diverticulosis, rec rpt 1 yr [name])   COLONOSCOPY  12/2013   3 polyps, diverticulosis, rec rpt 3 yrs [name])   COLONOSCOPY  06/2019   6 polyps (TA), diverticulosis, f/u left open ended [name])   CORONARY ARTERY BYPASS GRAFT N/A 01/18/2018   Procedure: CORONARY ARTERY BYPASS GRAFTING (CABG) x 3; Using Left Internal Mammary Artery, and Right Greater Saphenous Vein harvested Endoscopically, Coronary Artery Endarterectomy;  Surgeon: Kerin Perna, MD;  Location: [facility] OR;  Service: Open Heart Surgery;  Laterality: N/A;   ERCP N/A 10/23/2021   Procedure: ENDOSCOPIC RETROGRADE CHOLANGIOPANCREATOGRAPHY (ERCP);  Surgeon: Meryl Dare, MD;  Location: [facility] ENDOSCOPY;  Service: Endoscopy;  Laterality: N/A;   KNEE CARTILAGE SURGERY Left    LEFT HEART CATH AND CORONARY ANGIOGRAPHY N/A 12/21/2017   Procedure: LEFT HEART CATH AND CORONARY ANGIOGRAPHY;  Surgeon: Yvonne Kendall, MD;  Location: [facility]  INVASIVE CV LAB;  Service: Cardiovascular;  Laterality: N/A;   LITHOTRIPSY     REMOVAL OF STONES  10/23/2021   Procedure: REMOVAL OF STONES;  Surgeon: Meryl Dare, MD;  Location: [facility] ENDOSCOPY;  Service: Endoscopy;;   SPHINCTEROTOMY  10/23/2021   Procedure: Dennison Mascot;  Surgeon: Meryl Dare, MD;  Location: [facility] ENDOSCOPY;  Service: Endoscopy;;   TEE WITHOUT CARDIOVERSION N/A 01/18/2018   Procedure: TRANSESOPHAGEAL ECHOCARDIOGRAM (TEE);  Surgeon: Donata Clay, Theron Arista, MD;  Location: [facility] OR;  Service: Open Heart Surgery;  Laterality: N/A;   TEE WITHOUT CARDIOVERSION N/A 10/28/2021   Procedure: TRANSESOPHAGEAL ECHOCARDIOGRAM (TEE);  Surgeon: Chrystie Nose, MD;  Location: [facility] ENDOSCOPY;  Service: Cardiovascular;  Laterality: N/A;   UMBILICAL HERNIA REPAIR     with mesh   Social History:  reports that he has never smoked. He has been exposed to tobacco smoke. He quit smokeless tobacco use about 21 years ago.  His smokeless tobacco use included chew. He reports that he does not currently use alcohol. He reports that he does not use drugs.  No Known Allergies Family History  Problem Relation Age of Onset   Alzheimer's disease Mother    Breast cancer Mother  breast   Atrial fibrillation Mother    CAD Mother    Stroke Father 5   Diabetes Father    CAD Father    Colon polyps Sister    Colon polyps Brother    Rectal cancer Maternal Grandfather        rectal   Colon cancer Maternal Grandfather 58   Esophageal cancer Neg Hx    Stomach cancer Neg Hx    Family history: Family history reviewed and not pertinent.  Prior to Admission medications   Medication Sig Start Date End Date Taking? Authorizing Provider  furosemide (LASIX) 40 MG tablet Take 1 tablet (40 mg total) by mouth daily as needed. Patient not taking: Reported on 12/28/2022 12/22/22   Karie Schwalbe, MD  ACCU-CHEK AVIVA PLUS test strip USE AS DIRECTED TO CHECK BLOOD SUGARS UPTO FOUR TIMES DAILY. Patient not taking:  Reported on 12/28/2022 03/16/20   [name] Boyden, MD  apixaban (ELIQUIS) 5 MG TABS tablet TAKE 1 TABLET BY MOUTH TWICE A DAY 08/23/22   End, [name], MD  blood glucose meter kit and supplies Dispense based on patient and insurance preference. Use up to four times daily as directed. (FOR ICD-10 E10.9, E11.9). Patient not taking: Reported on 12/28/2022 02/01/18   Angiulli, Mcarthur Rossetti, PA-C  Cholecalciferol (VITAMIN D3) 25 MCG (1000 UT) CAPS Take 1 capsule (1,000 Units total) by mouth daily. 05/24/20   [name] Boyden, MD  Cyanocobalamin (B-12) 1000 MCG CAPS Take by mouth daily at 2 am.    [provider]  ENTRESTO 24-26 MG TAKE 1 TABLET BY MOUTH TWICE A DAY 09/25/22   End, [name], MD  FARXIGA 5 MG TABS tablet Take 5 mg by mouth daily. Patient not taking: Reported on 12/28/2022 10/27/22   [provider]  fenofibrate (TRICOR) 145 MG tablet TAKE 1 TABLET BY MOUTH EVERY DAY 10/05/22   End, [name], MD  metFORMIN (GLUCOPHAGE) 500 MG tablet TAKE 1 TABLET BY MOUTH EVERY DAY WITH BREAKFAST Patient not taking: Reported on 12/28/2022 09/18/22   [name] Boyden, MD  metoprolol succinate (TOPROL-XL) 50 MG 24 hr tablet TAKE 1 TABLET BY MOUTH DAILY. TAKE WITH OR IMMEDIATELY FOLLOWING A MEAL. 09/25/22   End, [name], MD  Multiple Vitamins-Minerals (MACULAR VITAMIN BENEFIT PO) Take 1 tablet by mouth daily.    [provider]  nitroGLYCERIN (NITROSTAT) 0.4 MG SL tablet Place 1 tablet (0.4 mg total) under the tongue every 5 (five) minutes as needed for chest pain. 03/26/20   End, [name], MD  Omega-3 Fatty Acids (FISH OIL) 1200 MG CAPS Take 2 capsules (2,400 mg total) by mouth daily. 05/24/20   [name] Boyden, MD  omeprazole (PRILOSEC) 40 MG capsule TAKE 1 CAPSULE BY MOUTH EVERY MONDAY,WEDNESDAY, AND FRIDAY 07/24/22   [name] Boyden, MD  rosuvastatin (CRESTOR) 10 MG tablet TAKE 1 TABLET BY MOUTH EVERY DAY 06/15/22   End, [name], MD  tamsulosin (FLOMAX) 0.4 MG CAPS  capsule TAKE 1 CAPSULE BY MOUTH EVERY DAY 09/18/22   [name] Boyden, MD   Physical Exam: Vitals:   12/28/22 1445 12/28/22 1500 12/28/22 1530 12/28/22 1715  BP: (!) 140/116 (!) 111/58 116/63 (!) 117/55  Pulse: (!) 35 62 63 69  Resp: 18 (!) 22 19 20   Temp:   98 F (36.7 C) 98.1 F (36.7 C)  TempSrc:   Oral Oral  SpO2:   99% 98%   Constitutional: appears age-appropriate, frail Eyes: PERRL, lids and conjunctivae normal HENMT: Bilateral temporal wasting, mucous membranes are moist. Posterior pharynx clear of  any exudate or lesions. Age-appropriate dentition. Hearing appropriate Neck: normal, supple, no masses, no thyromegaly Respiratory: clear to auscultation bilaterally, no wheezing, no crackles. Normal respiratory effort. No accessory muscle use.  Cardiovascular: Regular rate and rhythm, no murmurs / rubs / gallops. No extremity edema. 2+ pedal pulses. No carotid bruits.  Abdomen: Obese abdomen, no tenderness, no masses palpated, no hepatosplenomegaly. Bowel sounds positive.  Musculoskeletal: no clubbing / cyanosis. No joint deformity upper and lower extremities. Good ROM, no contractures, no atrophy. Normal muscle tone.  Skin: no rashes, lesions, ulcers. No induration Neurologic: Sensation intact. Strength 5/5 in all 4.  Psychiatric: Normal judgment and insight. Alert and oriented x 3.  Depressed and tearful mood.   EKG: independently reviewed, showing sinus rhythm with rate of 88, QTc 418.  PVCs  Chest x-ray on Admission: I personally reviewed and I agree with radiologist reading as below.  CT Angio Chest PE W and/or Wo Contrast  Result Date: 12/28/2022 CLINICAL DATA:  Headache, short of breath, weakness, history of endocarditis, abdominal pain, nonlocalized right upper quadrant pain EXAM: CT ANGIOGRAPHY CHEST CT ABDOMEN AND PELVIS WITH CONTRAST TECHNIQUE: Multidetector CT imaging of the chest was performed using the standard protocol during bolus administration of intravenous  contrast. Multiplanar CT image reconstructions and MIPs were obtained to evaluate the vascular anatomy. Multidetector CT imaging of the abdomen and pelvis was performed using the standard protocol during bolus administration of intravenous contrast. RADIATION DOSE REDUCTION: This exam was performed according to the departmental dose-optimization program which includes automated exposure control, adjustment of the mA and/or kV according to patient size and/or use of iterative reconstruction technique. CONTRAST:  OMNIPAQUE IOHEXOL 350 MG/ML SOLN COMPARISON:  05/10/2022, 10/23/2021 FINDINGS: CTA CHEST FINDINGS Cardiovascular: This is a technically adequate evaluation of the pulmonary vasculature. No filling defects or pulmonary emboli. The heart is enlarged, with no evidence of pericardial effusion. There is calcification of the aortic and mitral valves. Normal caliber of the thoracic aorta. No evidence of aneurysm or dissection. Postsurgical changes from prior CABG. Atherosclerosis of the aorta and native coronary vessels. Mediastinum/Nodes: No enlarged mediastinal, hilar, or axillary lymph nodes. Thyroid gland, trachea, and esophagus demonstrate no significant findings. Lungs/Pleura: There is a large right pleural effusion, volume estimated in excess of 1 L. minimal compressive atelectasis of the right lower lobe. No airspace disease or pneumothorax. Minimal mucoid material within the trachea. Otherwise the central airways are patent. Musculoskeletal: No acute or destructive bony lesions. Reconstructed images demonstrate no additional findings. Review of the MIP images confirms the above findings. CT ABDOMEN and PELVIS FINDINGS Hepatobiliary: Since the prior exam, the complex fluid collection along the ventral aspect of the liver has enlarged, measuring 15.8 x 8.6 by 9.8 cm on today's exam. Continued mural thickening with internal septations concerning for subcapsular or Peri hepatic abscess. This is amenable  to percutaneous sampling if not previously performed. The remainder of the liver is unremarkable. Gallbladder is surgically absent. No biliary duct dilation. Pancreas: Unremarkable. No pancreatic ductal dilatation or surrounding inflammatory changes. Spleen: Normal in size without focal abnormality. Adrenals/Urinary Tract: Stable bilateral renal cysts do not require specific imaging follow-up. Stable nonobstructing right renal calculi, measuring up to 5 mm. No obstructive uropathy within either kidney. The adrenals and bladder are unremarkable. Stomach/Bowel: No bowel obstruction or ileus. Diffuse colonic diverticulosis without evidence of diverticulitis. No bowel wall thickening. Vascular/Lymphatic: Aortic atherosclerosis. No enlarged abdominal or pelvic lymph nodes. Reproductive: Mild enlargement of the prostate unchanged. Other: No free fluid or free  intraperitoneal gas. Evidence of prior umbilical hernia repair. Stable fat containing bilateral inguinal hernias. No bowel herniation. Musculoskeletal: No acute or destructive bony lesions. Reconstructed images demonstrate no additional findings. Review of the MIP images confirms the above findings. IMPRESSION: Chest: 1. No evidence of pulmonary embolus. 2. Large right pleural effusion, volume estimated in excess of 1 L. 3. Cardiomegaly. 4. Aortic and mitral valve calcifications. 5.  Aortic Atherosclerosis (ICD10-I70.0). Abdomen/pelvis: 1. Enlarging 15.8 x 8.6 x 9.8 cm complex fluid collection along the ventral margin of the liver, increased since prior exam and consistent with enlarging subcapsular or perihepatic abscess. This is amenable to percutaneous aspiration/drainage. 2. Nonobstructing right renal calculi. 3. Diffuse colonic diverticulosis without diverticulitis. 4.  Aortic Atherosclerosis (ICD10-I70.0). Electronically Signed   By: Sharlet Salina M.D.   On: 12/28/2022 14:57   CT ABDOMEN PELVIS W CONTRAST  Result Date: 12/28/2022 CLINICAL DATA:  Headache,  short of breath, weakness, history of endocarditis, abdominal pain, nonlocalized right upper quadrant pain EXAM: CT ANGIOGRAPHY CHEST CT ABDOMEN AND PELVIS WITH CONTRAST TECHNIQUE: Multidetector CT imaging of the chest was performed using the standard protocol during bolus administration of intravenous contrast. Multiplanar CT image reconstructions and MIPs were obtained to evaluate the vascular anatomy. Multidetector CT imaging of the abdomen and pelvis was performed using the standard protocol during bolus administration of intravenous contrast. RADIATION DOSE REDUCTION: This exam was performed according to the departmental dose-optimization program which includes automated exposure control, adjustment of the mA and/or kV according to patient size and/or use of iterative reconstruction technique. CONTRAST:  OMNIPAQUE IOHEXOL 350 MG/ML SOLN COMPARISON:  05/10/2022, 10/23/2021 FINDINGS: CTA CHEST FINDINGS Cardiovascular: This is a technically adequate evaluation of the pulmonary vasculature. No filling defects or pulmonary emboli. The heart is enlarged, with no evidence of pericardial effusion. There is calcification of the aortic and mitral valves. Normal caliber of the thoracic aorta. No evidence of aneurysm or dissection. Postsurgical changes from prior CABG. Atherosclerosis of the aorta and native coronary vessels. Mediastinum/Nodes: No enlarged mediastinal, hilar, or axillary lymph nodes. Thyroid gland, trachea, and esophagus demonstrate no significant findings. Lungs/Pleura: There is a large right pleural effusion, volume estimated in excess of 1 L. minimal compressive atelectasis of the right lower lobe. No airspace disease or pneumothorax. Minimal mucoid material within the trachea. Otherwise the central airways are patent. Musculoskeletal: No acute or destructive bony lesions. Reconstructed images demonstrate no additional findings. Review of the MIP images confirms the above findings. CT ABDOMEN and  PELVIS FINDINGS Hepatobiliary: Since the prior exam, the complex fluid collection along the ventral aspect of the liver has enlarged, measuring 15.8 x 8.6 by 9.8 cm on today's exam. Continued mural thickening with internal septations concerning for subcapsular or Peri hepatic abscess. This is amenable to percutaneous sampling if not previously performed. The remainder of the liver is unremarkable. Gallbladder is surgically absent. No biliary duct dilation. Pancreas: Unremarkable. No pancreatic ductal dilatation or surrounding inflammatory changes. Spleen: Normal in size without focal abnormality. Adrenals/Urinary Tract: Stable bilateral renal cysts do not require specific imaging follow-up. Stable nonobstructing right renal calculi, measuring up to 5 mm. No obstructive uropathy within either kidney. The adrenals and bladder are unremarkable. Stomach/Bowel: No bowel obstruction or ileus. Diffuse colonic diverticulosis without evidence of diverticulitis. No bowel wall thickening. Vascular/Lymphatic: Aortic atherosclerosis. No enlarged abdominal or pelvic lymph nodes. Reproductive: Mild enlargement of the prostate unchanged. Other: No free fluid or free intraperitoneal gas. Evidence of prior umbilical hernia repair. Stable fat containing bilateral inguinal hernias. No  bowel herniation. Musculoskeletal: No acute or destructive bony lesions. Reconstructed images demonstrate no additional findings. Review of the MIP images confirms the above findings. IMPRESSION: Chest: 1. No evidence of pulmonary embolus. 2. Large right pleural effusion, volume estimated in excess of 1 L. 3. Cardiomegaly. 4. Aortic and mitral valve calcifications. 5.  Aortic Atherosclerosis (ICD10-I70.0). Abdomen/pelvis: 1. Enlarging 15.8 x 8.6 x 9.8 cm complex fluid collection along the ventral margin of the liver, increased since prior exam and consistent with enlarging subcapsular or perihepatic abscess. This is amenable to percutaneous  aspiration/drainage. 2. Nonobstructing right renal calculi. 3. Diffuse colonic diverticulosis without diverticulitis. 4.  Aortic Atherosclerosis (ICD10-I70.0). Electronically Signed   By: Sharlet Salina M.D.   On: 12/28/2022 14:57   CT HEAD WO CONTRAST ( )  Result Date: 12/28/2022 CLINICAL DATA:  Headache, shortness of breath, weakness, on Eliquis EXAM: CT HEAD WITHOUT CONTRAST TECHNIQUE: Contiguous axial images were obtained from the base of the skull through the vertex without intravenous contrast. RADIATION DOSE REDUCTION: This exam was performed according to the departmental dose-optimization program which includes automated exposure control, adjustment of the mA and/or kV according to patient size and/or use of iterative reconstruction technique. COMPARISON:  10/23/2021 FINDINGS: Brain: No evidence of acute infarction, hemorrhage, mass, mass effect, or midline shift. No hydrocephalus or extra-axial fluid collection. Periventricular white matter changes, likely the sequela of chronic small vessel ischemic disease. Cerebral volume is within normal limits for age. Vascular: No hyperdense vessel. Atherosclerotic calcifications in the intracranial carotid and vertebral arteries. Skull: Negative for fracture or focal lesion. Sinuses/Orbits: No acute finding. Status post bilateral lens replacements. Other: The mastoid air cells are well aerated. IMPRESSION: No acute intracranial process. Electronically Signed   By: Wiliam Ke M.D.   On: 12/28/2022 14:46   DG Chest 2 View  Result Date: 12/28/2022 CLINICAL DATA:  SOB EXAM: CHEST - 2 VIEW COMPARISON:  12/26/2022. FINDINGS: Enlarged cardiac silhouette. Small right-sided pleural effusion. No pneumothorax. Calcified aorta. Status post median sternotomy and CABG. IMPRESSION: Enlarged cardiac silhouette.  Small right-sided pleural effusion. Electronically Signed   By: Layla Maw M.D.   On: 12/28/2022 12:36    Labs on Admission: I have personally reviewed  following labs CBC: Recent Labs  Lab 12/26/22 1405 12/28/22 1135  WBC 10.2 10.6*  NEUTROABS 7.5  --   HGB 12.0* 12.1*  HCT 36.6* 38.6*  MCV 84.3 86.5  PLT 544.0* 627*   Basic Metabolic Panel: Recent Labs  Lab 12/27/22 0807 12/28/22 1135  NA 137 136  K 5.1 4.4  CL 101 103  CO2 23 24  GLUCOSE 117* 136*  BUN 58* 41*  CREATININE 1.34 1.17  CALCIUM 8.8 8.8*  PHOS 3.8  --    GFR: Estimated Creatinine Clearance: 59.8 mL/min (by C-G formula based on SCr of 1.17 mg/dL).  Liver Function Tests: Recent Labs  Lab 12/27/22 0807 12/28/22 1135 12/28/22 1233  AST  --  32 26  ALT  --  15 11  ALKPHOS  --  65 74  BILITOT  --  0.6 0.4  PROT  --  6.8 6.1  ALBUMIN 2.9* 2.5* 2.9*   Cardiac Enzymes: Recent Labs  Lab 12/26/22 1405  CKTOTAL 17   BNP (last 3 results) Recent Labs    12/21/22 1455  PROBNP 357.0*   HbA1C: Recent Labs    12/26/22 1405  HGBA1C 5.8   Thyroid Function Tests: Recent Labs    12/26/22 1405  TSH 0.05*  FREET4 1.68*   Urine analysis:  Component Value Date/Time   COLORURINE AMBER (A) 10/31/2021 1205   APPEARANCEUR CLEAR 10/31/2021 1205   LABSPEC 1.021 10/31/2021 1205   PHURINE 5.0 10/31/2021 1205   GLUCOSEU NEGATIVE 10/31/2021 1205   HGBUR NEGATIVE 10/31/2021 1205   BILIRUBINUR negative 12/26/2022 1440   KETONESUR NEGATIVE 10/31/2021 1205   PROTEINUR Negative 12/26/2022 1440   PROTEINUR NEGATIVE 10/31/2021 1205   UROBILINOGEN 1.0 12/26/2022 1440   UROBILINOGEN 1.0 06/24/2013 2159   NITRITE negative 12/26/2022 1440   NITRITE NEGATIVE 10/31/2021 1205   LEUKOCYTESUR Negative 12/26/2022 1440   LEUKOCYTESUR NEGATIVE 10/31/2021 1205   This document was prepared using Dragon Voice Recognition software and may include unintentional dictation errors.  Dr. Sedalia Muta [facility]  If 7PM-7AM, please contact overnight-coverage provider If 7AM-7PM, please contact day attending provider [email or web address]  12/28/2022, 6:41 PM

## 2022-12-28 NOTE — Assessment & Plan Note (Signed)
Prior CT 04/2022 showing irregular hypodense liver dome lesion measuring 5.9x4.3cm - ?intrahepatic abscess - this had not been biopsied during lap chole 05/2022. He had previously declined further evaluation for this. I wonder if this is contributing to  current symptoms including recurrent R pleural effusion - will offer IR guided fluid sampling.

## 2022-12-28 NOTE — Assessment & Plan Note (Deleted)
History of E. coli and Streptococcus bacteremia

## 2022-12-28 NOTE — Assessment & Plan Note (Addendum)
He continues entresto, toprol XL. Has not been taking lasix. Hold lasix until labs return. Did recommend sooner cards f/u ensure FTT not cardiac related.

## 2022-12-28 NOTE — Assessment & Plan Note (Signed)
Update renal panel 

## 2022-12-28 NOTE — Assessment & Plan Note (Addendum)
S/p thoracentesis x2 in the past few months, latest last week.  Cytology negative for malignancy both times, culture negative as well as fungal and AfB cultures.  Initially treated with azithromycin and doxycycline.  Recent fluid analysis with 1500 white cells, PMN predominance.  Update CXR today - overall better.

## 2022-12-28 NOTE — Assessment & Plan Note (Deleted)
History of Streptococcus bacteremia

## 2022-12-28 NOTE — ED Notes (Signed)
Lab to add on blood work 

## 2022-12-28 NOTE — Assessment & Plan Note (Addendum)
-   Continue Eliquis 

## 2022-12-28 NOTE — Assessment & Plan Note (Addendum)
Continue supplements

## 2022-12-28 NOTE — Hospital Course (Addendum)
[name], [name] Young. 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 Showed large right pleural effusion, large liver abscess of 15.8 x8.6 x9.8 cm. Patient is placed on broad-spectrum antibiotics changed to Zosyn.  IR drain performed on 4/19.  Culture came back with E. coli, pansensitive. Consult from ID obtained.  Recommended 4 weeks ceftriaxone, with final dose of 5/16. OPAT placed.  4/24.  Patient's daughter concerned that his urine is dark and wanted IV fluids overnight.  Explained that the patient needs to eat and drink.  Started on Remeron for poor appetite and sleep. 4/25.  Medically stable to go out to rehab once bed and insurance authorization obtained 4/26.  Received insurance authorization to go out to rehab.  Will restart Marcelline Deist and [place] as outpatient.  Holding parameters on Entresto for systolic blood pressure less than 100.  Lasix only as needed for shortness of breath.

## 2022-12-28 NOTE — Consult Note (Signed)
PHARMACY -  BRIEF ANTIBIOTIC NOTE   Pharmacy has received consult(s) for Sepsis from an ED provider.  The patient's profile has been reviewed for ht/wt/allergies/indication/available labs.    One time order(s) placed for cefepime and vancomycin   Further antibiotics/pharmacy consults should be ordered by admitting physician if indicated.                       Thank you, Ronnald Ramp 12/28/2022  3:10 PM

## 2022-12-28 NOTE — ED Notes (Signed)
Difficult stick, only able to obtain 1 set of cultures.

## 2022-12-28 NOTE — Consult Note (Signed)
CODE SEPSIS - PHARMACY COMMUNICATION  **Broad Spectrum Antibiotics should be administered within 1 hour of Sepsis diagnosis**  Time Code Sepsis Called/Page Received: 1500  Antibiotics Ordered: cefepime + vancomycin  Time of 1st antibiotic administration: 1534  Additional action taken by pharmacy: None     Ronnald Ramp ,PharmD Clinical Pharmacist  12/28/2022  4:04 PM

## 2022-12-29 ENCOUNTER — Inpatient Hospital Stay
Admit: 2022-12-29 | Discharge: 2022-12-29 | Disposition: A | Discharge status: Home / Self Care | Payer: Medicare PPO | Attending: Internal Medicine | Admitting: Internal Medicine

## 2022-12-29 ENCOUNTER — Inpatient Hospital Stay: Payer: Medicare PPO | Admitting: Radiology

## 2022-12-29 ENCOUNTER — Telehealth: Payer: Self-pay

## 2022-12-29 ENCOUNTER — Inpatient Hospital Stay: Payer: Medicare PPO

## 2022-12-29 DIAGNOSIS — D75838 Other thrombocytosis: Secondary | ICD-10-CM | POA: Insufficient documentation

## 2022-12-29 DIAGNOSIS — J90 Pleural effusion, not elsewhere classified: Secondary | ICD-10-CM

## 2022-12-29 DIAGNOSIS — K750 Abscess of liver: Secondary | ICD-10-CM | POA: Diagnosis not present

## 2022-12-29 DIAGNOSIS — I5022 Chronic systolic (congestive) heart failure: Secondary | ICD-10-CM

## 2022-12-29 HISTORY — PX: IR THORACENTESIS ASP PLEURAL SPACE W/IMG GUIDE: IMG5380

## 2022-12-29 LAB — CBC
HCT: 32.2 % — ABNORMAL LOW (ref 39.0–52.0)
Hemoglobin: 10.1 g/dL — ABNORMAL LOW (ref 13.0–17.0)
MCH: 27.6 pg (ref 26.0–34.0)
MCHC: 31.4 g/dL (ref 30.0–36.0)
MCV: 88 fL (ref 80.0–100.0)
Platelets: 471 10*3/uL — ABNORMAL HIGH (ref 150–400)
RBC: 3.66 MIL/uL — ABNORMAL LOW (ref 4.22–5.81)
RDW: 16.1 % — ABNORMAL HIGH (ref 11.5–15.5)
WBC: 9.8 10*3/uL (ref 4.0–10.5)
nRBC: 0 % (ref 0.0–0.2)

## 2022-12-29 LAB — MAGNESIUM: Magnesium: 1.8 mg/dL (ref 1.7–2.4)

## 2022-12-29 LAB — BODY FLUID CELL COUNT WITH DIFFERENTIAL
Eos, Fluid: 0 %
Lymphs, Fluid: 31 %
Monocyte-Macrophage-Serous Fluid: 17 %
Neutrophil Count, Fluid: 52 %
Total Nucleated Cell Count, Fluid: 1158 cu mm

## 2022-12-29 LAB — BASIC METABOLIC PANEL
Anion gap: 8 (ref 5–15)
BUN: 33 mg/dL — ABNORMAL HIGH (ref 8–23)
CO2: 22 mmol/L (ref 22–32)
Calcium: 8.3 mg/dL — ABNORMAL LOW (ref 8.9–10.3)
Chloride: 108 mmol/L (ref 98–111)
Creatinine, Ser: 1.07 mg/dL (ref 0.61–1.24)
GFR, Estimated: >60 mL/min (ref >60)
Glucose, Bld: 115 mg/dL — ABNORMAL HIGH (ref 70–99)
Potassium: 4.2 mmol/L (ref 3.5–5.1)
Sodium: 138 mmol/L (ref 135–145)

## 2022-12-29 LAB — PROTEIN, PLEURAL OR PERITONEAL FLUID: Total protein, fluid: 3.7 g/dL

## 2022-12-29 LAB — PROTIME-INR
INR: 1.6 — ABNORMAL HIGH (ref 0.8–1.2)
Prothrombin Time: 19 seconds — ABNORMAL HIGH (ref 11.4–15.2)

## 2022-12-29 LAB — LACTATE DEHYDROGENASE, PLEURAL OR PERITONEAL FLUID: LD, Fluid: 71 U/L — ABNORMAL HIGH (ref 3–23)

## 2022-12-29 LAB — GLUCOSE, PLEURAL OR PERITONEAL FLUID: Glucose, Fluid: 107 mg/dL

## 2022-12-29 LAB — AEROBIC/ANAEROBIC CULTURE W GRAM STAIN (SURGICAL/DEEP WOUND)

## 2022-12-29 MED ORDER — VANCOMYCIN HCL 1750 MG/350ML IV SOLN
1750.0000 mg | INTRAVENOUS | Status: DC
  Filled 2022-12-29: qty 350

## 2022-12-29 MED ORDER — ENSURE ENLIVE PO LIQD
237.0000 mL | Freq: Two times a day (BID) | ORAL | Status: DC
  Administered 2022-12-29 – 2023-01-05 (×15): 237 mL via ORAL

## 2022-12-29 MED ORDER — MIDAZOLAM HCL 2 MG/2ML IJ SOLN
INTRAMUSCULAR | Status: AC
  Filled 2022-12-29: qty 2

## 2022-12-29 MED ORDER — MELATONIN 5 MG PO TABS: 5.0000 mg | ORAL_TABLET | Freq: Every day | ORAL | Status: DC

## 2022-12-29 MED ORDER — LIDOCAINE HCL 1 % IJ SOLN
INTRAMUSCULAR | Status: AC
  Filled 2022-12-29: qty 20

## 2022-12-29 MED ORDER — OXYCODONE-ACETAMINOPHEN 5-325 MG PO TABS
1.0000 | ORAL_TABLET | Freq: Four times a day (QID) | ORAL | Status: DC | PRN
  Administered 2022-12-29: 1 via ORAL
  Administered 2022-12-30 – 2023-01-02 (×3): 2 via ORAL
  Administered 2023-01-05: 1 via ORAL
  Filled 2022-12-29 (×3): qty 2
  Filled 2022-12-29: qty 1

## 2022-12-29 MED ORDER — ACETAMINOPHEN 325 MG PO TABS
325.0000 mg | ORAL_TABLET | Freq: Every evening | ORAL | Status: DC | PRN
  Administered 2022-12-29 – 2023-01-03 (×4): 325 mg via ORAL
  Filled 2022-12-29 (×4): qty 1

## 2022-12-29 MED ORDER — ACETAMINOPHEN 500 MG PO TABS: 500.0000 mg | ORAL_TABLET | Freq: Every evening | ORAL | Status: DC

## 2022-12-29 MED ORDER — FENTANYL CITRATE (PF) 100 MCG/2ML IJ SOLN
INTRAMUSCULAR | Status: AC
  Filled 2022-12-29: qty 2

## 2022-12-29 MED ORDER — FENTANYL CITRATE (PF) 100 MCG/2ML IJ SOLN
INTRAMUSCULAR | Status: AC | PRN
  Administered 2022-12-29 (×2): 25 ug via INTRAVENOUS

## 2022-12-29 MED ORDER — LIDOCAINE HCL 1 % IJ SOLN
10.0000 mL | Freq: Once | INTRAMUSCULAR | Status: AC
  Administered 2022-12-29: 10 mL via INTRADERMAL

## 2022-12-29 MED ORDER — MIDAZOLAM HCL 2 MG/2ML IJ SOLN
INTRAMUSCULAR | Status: AC | PRN
  Administered 2022-12-29 (×2): .5 mg via INTRAVENOUS

## 2022-12-29 MED ORDER — OXYCODONE-ACETAMINOPHEN 5-325 MG PO TABS
ORAL_TABLET | ORAL | Status: AC
  Filled 2022-12-29: qty 1

## 2022-12-29 MED ORDER — DIPHENHYDRAMINE HCL 25 MG PO CAPS
25.0000 mg | ORAL_CAPSULE | Freq: Every evening | ORAL | Status: DC | PRN
  Administered 2022-12-29 – 2023-01-04 (×5): 25 mg via ORAL
  Filled 2022-12-29 (×5): qty 1

## 2022-12-29 MED ORDER — PIPERACILLIN-TAZOBACTAM 3.375 G IVPB
3.3750 g | Freq: Three times a day (TID) | INTRAVENOUS | Status: DC
  Administered 2022-12-29 – 2023-01-01 (×10): 3.375 g via INTRAVENOUS
  Filled 2022-12-29 (×9): qty 50

## 2022-12-29 MED ORDER — SODIUM CHLORIDE 0.9% FLUSH
5.0000 mL | Freq: Three times a day (TID) | INTRAVENOUS | Status: DC
  Administered 2022-12-29 – 2023-01-05 (×19): 5 mL

## 2022-12-29 MED ORDER — DIPHENHYDRAMINE HCL 25 MG PO CAPS: 50.0000 mg | ORAL_CAPSULE | Freq: Every evening | ORAL | Status: DC

## 2022-12-29 NOTE — Progress Notes (Signed)
Initial Nutrition Assessment  DOCUMENTATION CODES:   Obesity unspecified  INTERVENTION:   -Once diet is advanced, add:   -Ensure Enlive po TID, each supplement provides 350 kcal and 20 grams of protein -MVI with minerals daily  NUTRITION DIAGNOSIS:   Increased nutrient needs related to acute illness (hepatic abscess) as evidenced by estimated needs.  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  Pt admitted with shortness of breath.   Reviewed I/O's: +489 ml x 24 hours  UOP: 375 ml x 24 hours  Drain output: 550 ml x 24 hours  Pt unavailable at time of visit. RD unable to obtain further nutrition-related history or complete nutrition-focused physical exam at this time.    Per MD notes, pt being worked up for heart failure exacerbation. Complete echo has been ordered.   IR has been consulted for evaluation of hepatic abscess; plan for possible thoracentesis and hepatic abscess drain   Reviewed wt hx; pt has experienced a 6.5% wt loss over the past 3 months. While this is not significant for time, it is concerning given pt's poor oral intake and multiple co-morbidities. Pt is at high risk for malnutrition, however, RD unable to identify at this time. Pt would greatly benefit from oral nutrition supplements once on diet.   Medications reviewed and include  vitamin D3 and 0.9% sodium chloride @ 50 ml/hr.   Labs reviewed: CBGS: 185.   Diet Order:   Diet Order             Diet NPO time specified Except for: Sips with Meds  Diet effective midnight                   EDUCATION NEEDS:   No education needs have been identified at this time  Skin:  Skin Assessment: Reviewed RN Assessment  Last BM:  Unknown  Height:   Ht Readings from Last 1 Encounters:  12/28/22 5' 10.5" (1.791 m)    Weight:   Wt Readings from Last 1 Encounters:  12/28/22 98.4 kg    Ideal Body Weight:  76.8 kg  BMI:  There is no height or weight on file to calculate BMI.  Estimated Nutritional Needs:   Kcal:  2100-2300  Protein:  115-130 grams  Fluid:  > 2 L    Levada Schilling, RD, LDN, CDCES Registered Dietitian II Certified Diabetes Care and Education Specialist Please refer to [facility] for RD and/or RD on-call/weekend/after hours pager

## 2022-12-29 NOTE — Consult Note (Signed)
Chief Complaint: Patient was seen in consultation today for hepatic abscess and right pleural effusion  Referring Physician(s): Amy Cox, DO  Supervising Physician: Richarda Overlie  Patient Status: [facility] - ED  History of Present Illness: [name] Motyka. is a 82 y.o. male with complex PMH including cardiomyopathy, CKD stage III, CAD, heart failure with reduced ejection fraction, hypertension, and type 2 diabetes mellitus being seen today in relation to a hepatic abscess and right pleural effusion. Patient presented to [facility] ED on 12/28/22 with shortness of breath and weakness. CT imaging showed the presence of an enlarging complex fluid collection in the ventral margin of the liver that had previously been first seen on 05/10/22. Additionally, a large right pleural effusion was also seen on imaging at that time. IR was subsequently consulted for evaluation of suspected abscess and possible thoracentesis.  Past Medical History:  Diagnosis Date   Adenomatous colon polyp    Aortic atherosclerosis    Arthritis    Ascending aorta dilatation    a.) TTE 11/23/2017: asc Ao measured 37 mm; b.) TTE 10/23/2021: Ao root measured 41 mm, asc Ao measured 38 mm; c.) TEE 10/28/2021: asc Ao measured 38 mm; d.) TTE 01/11/2022: Ao root 40 mm, asc Ao 39 mm   Ascending cholangitis 10/2021   BPH (benign prostatic hyperplasia)    Cardiomyopathy    CKD (chronic kidney disease), stage III    Claustrophobia    Coronary artery disease    a.) LHC 12/21/2017: 75% mLAD, 90% D1, 80-99% OM1/2, CTO pRCA (L-R collaterals) --> CVTS consult. 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   In setting of bacteremia from ascending colangitis   GERD (gastroesophageal reflux disease)    HFrEF (heart failure with reduced ejection fraction)    a.) TTE 11/23/2017: EF 50-55%, mod MAC, triv TR, G1DD; b.) TTE 10/23/2021: EF 40-45%, mild LAE, Ao sclerosis, triv MR, G2DD; c.) TEE  10/28/2021: EF 40-45%, glob HK, mobile vegitation on MV; d.) TTE 01/11/2022: EF 50-55%, mild LVH, RVE, Ao sclerosis, mild MR/AR, G1DD   History of cholelithiasis    History of kidney stones    ca ox Vonita Moss @ [facility]) now [employer] [facility]   History of pneumonia    HLD (hyperlipidemia)    HTN (hypertension)    Jaundice    age 51   Long term current use of anticoagulant    a.) apixaban   Paroxysmal atrial fibrillation    a.) CHA2DS2VASc = 6 (age x 2, HFrEF, HTN, vascular disease history, T2DM);  b.) rate/rhythm maintained on oral metoprolol succinate; chronically anticoagulated with apixaban   Pneumonia    Right-sided carotid artery disease    a.) carotid doppler 04/05/2022: 1-39% RICA   S/P CABG x 3    a.) LIMA-LAD, SVG-diagonal, SVG-PL branch of RCA   S/P cataract extraction and insertion of intraocular lens    T2DM (type 2 diabetes mellitus) 2010    Past Surgical History:  Procedure Laterality Date   CARPAL TUNNEL RELEASE Bilateral    CATARACT EXTRACTION, BILATERAL     COLONOSCOPY  11/2012   11 adenomatous polyps, diverticulosis, rec rpt 1 yr [name])   COLONOSCOPY  12/2013   3 polyps, diverticulosis, rec rpt 3 yrs [name])   COLONOSCOPY  06/2019   6 polyps (TA), diverticulosis, f/u left open ended [name])   CORONARY ARTERY BYPASS GRAFT N/A 01/18/2018   Procedure: CORONARY ARTERY BYPASS GRAFTING (CABG) x 3; Using Left  Internal Mammary Artery, and Right Greater Saphenous Vein harvested Endoscopically, Coronary Artery Endarterectomy;  Surgeon: Kerin Perna, MD;  [facility] OR;  Service: Open Heart Surgery;  Laterality: N/A;   ERCP N/A 10/23/2021   Procedure: ENDOSCOPIC RETROGRADE CHOLANGIOPANCREATOGRAPHY (ERCP);  Surgeon: Meryl Dare, MD;  Location: [facility] ENDOSCOPY;  Service: Endoscopy;  Laterality: N/A;   KNEE CARTILAGE SURGERY Left    LEFT HEART CATH AND CORONARY ANGIOGRAPHY N/A 12/21/2017   Procedure: LEFT HEART CATH AND CORONARY ANGIOGRAPHY;  Surgeon: Yvonne Kendall, MD;   Location: [facility] INVASIVE CV LAB;  Service: Cardiovascular;  Laterality: N/A;   LITHOTRIPSY     REMOVAL OF STONES  10/23/2021   Procedure: REMOVAL OF STONES;  Surgeon: Meryl Dare, MD;  Location: [facility] ENDOSCOPY;  Service: Endoscopy;;   SPHINCTEROTOMY  10/23/2021   Procedure: Dennison Mascot;  Surgeon: Meryl Dare, MD;  Location: [facility] ENDOSCOPY;  Service: Endoscopy;;   TEE WITHOUT CARDIOVERSION N/A 01/18/2018   Procedure: TRANSESOPHAGEAL ECHOCARDIOGRAM (TEE);  Surgeon: Donata Clay, Theron Arista, MD;  Location: [facility] OR;  Service: Open Heart Surgery;  Laterality: N/A;   TEE WITHOUT CARDIOVERSION N/A 10/28/2021   Procedure: TRANSESOPHAGEAL ECHOCARDIOGRAM (TEE);  Surgeon: Chrystie Nose, MD;  Location: [facility] ENDOSCOPY;  Service: Cardiovascular;  Laterality: N/A;   UMBILICAL HERNIA REPAIR     with mesh    Allergies: Patient has no known allergies.  Medications: Prior to Admission medications   Medication Sig Start Date End Date Taking? Authorizing Provider  apixaban (ELIQUIS) 5 MG TABS tablet TAKE 1 TABLET BY MOUTH TWICE A DAY 08/23/22  Yes End, [name], MD  Cholecalciferol (VITAMIN D3) 25 MCG (1000 UT) CAPS Take 1 capsule (1,000 Units total) by mouth daily. 05/24/20  Yes [name], MD  Cyanocobalamin (B-12) 1000 MCG CAPS Take by mouth daily at 2 am.   Yes [provider]  ENTRESTO 24-26 MG TAKE 1 TABLET BY MOUTH TWICE A DAY 09/25/22  Yes End, [name], MD  fenofibrate (TRICOR) 145 MG tablet TAKE 1 TABLET BY MOUTH EVERY DAY 10/05/22  Yes End, [name], MD  furosemide (LASIX) 40 MG tablet Take 1 tablet (40 mg total) by mouth daily as needed. Patient not taking: Reported on 12/28/2022 12/22/22   Tillman Abide I, MD  metoprolol succinate (TOPROL-XL) 50 MG 24 hr tablet TAKE 1 TABLET BY MOUTH DAILY. TAKE WITH OR IMMEDIATELY FOLLOWING A MEAL. 09/25/22  Yes End, [name], MD  Multiple Vitamins-Minerals (MACULAR VITAMIN BENEFIT PO) Take 1 tablet by mouth daily.   Yes [provider]   nitroGLYCERIN (NITROSTAT) 0.4 MG SL tablet Place 1 tablet (0.4 mg total) under the tongue every 5 (five) minutes as needed for chest pain. 03/26/20  Yes End, [name], MD  Omega-3 Fatty Acids (FISH OIL) 1200 MG CAPS Take 2 capsules (2,400 mg total) by mouth daily. 05/24/20  Yes [name], MD  omeprazole (PRILOSEC) 40 MG capsule TAKE 1 CAPSULE BY MOUTH EVERY MONDAY,WEDNESDAY, AND FRIDAY 07/24/22  Yes [name], MD  rosuvastatin (CRESTOR) 10 MG tablet TAKE 1 TABLET BY MOUTH EVERY DAY 06/15/22  Yes End, [name], MD  tamsulosin (FLOMAX) 0.4 MG CAPS capsule TAKE 1 CAPSULE BY MOUTH EVERY DAY 09/18/22  Yes [name], MD  ACCU-CHEK AVIVA PLUS test strip USE AS DIRECTED TO CHECK BLOOD SUGARS UPTO FOUR TIMES DAILY. Patient not taking: Reported on 12/28/2022 03/16/20   [name], MD  blood glucose meter kit and supplies Dispense based on patient and insurance preference. Use up to four times daily as directed. (FOR ICD-10  E10.9, E11.9). Patient not taking: Reported on 12/28/2022 02/01/18   Angiulli, Mcarthur Rossetti, PA-C  FARXIGA 5 MG TABS tablet Take 5 mg by mouth daily. Patient not taking: Reported on 12/28/2022 10/27/22   [provider]  metFORMIN (GLUCOPHAGE) 500 MG tablet TAKE 1 TABLET BY MOUTH EVERY DAY WITH BREAKFAST Patient not taking: Reported on 12/28/2022 09/18/22   [name], MD     Family History  Problem Relation Age of Onset   Alzheimer's disease Mother    Breast cancer Mother        breast   Atrial fibrillation Mother    CAD Mother    Stroke Father 62   Diabetes Father    CAD Father    Colon polyps Sister    Colon polyps Brother    Rectal cancer Maternal Grandfather        rectal   Colon cancer Maternal Grandfather 88   Esophageal cancer Neg Hx    Stomach cancer Neg Hx     Social History   Socioeconomic History   Marital status: Married    Spouse name: Not on file   Number of children: Not on file   Years of education: Not on file    Highest education level: 12th grade  Occupational History   Not on file  Tobacco Use   Smoking status: Never    Passive exposure: Past   Smokeless tobacco: Former    Types: Chew    Quit date: 09/08/2001  Vaping Use   Vaping Use: Never used  Substance and Sexual Activity   Alcohol use: Not Currently    Comment: occ   Drug use: No   Sexual activity: Not Currently  Other Topics Concern   Not on file  Social History Narrative   Caffeine: occasional Lives with wife, grandson [name] 2009)Occupation: retired, 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.Diet: some water, fruits/vegetables daily, avoids potatoes   Social Determinants of Health   Financial Resource Strain: Low Risk  (12/26/2022)   Overall Financial Resource Strain (CARDIA)    Difficulty of Paying Living Expenses: Not hard at all  Food Insecurity: Patient Declined (12/26/2022)   Hunger Vital Sign    Worried About Running Out of Food in the Last Year: Patient declined    Ran Out of Food in the Last Year: Patient declined  Transportation Needs: No Transportation Needs (12/26/2022)   PRAPARE - Administrator, Civil Service (Medical): No    Lack of Transportation (Non-Medical): No  Physical Activity: Unknown (12/26/2022)   Exercise Vital Sign    Days of Exercise per Week: Patient declined    Minutes of Exercise per Session: Not on file  Stress: No Stress Concern Present (12/26/2022)   [employer] of Occupational Health - Occupational Stress Questionnaire    Feeling of Stress : Not at all  Social Connections: Unknown (12/26/2022)   Social Connection and Isolation Panel [NHANES]    Frequency of Communication with Friends and Family: Patient declined    Frequency of Social Gatherings with Friends and Family: Patient declined    Attends Religious Services: Patient declined    Database administrator or Organizations: Yes    Attends Banker Meetings: Patient declined    Marital  Status: Married     Code Status: DNR  Review of Systems: A 12 point ROS discussed and pertinent positives are indicated in the HPI above.  All other systems are negative.  Review of Systems  Constitutional:  Negative for chills and fever.  Respiratory:  Positive for shortness of breath. Negative for chest tightness.   Cardiovascular:  Negative for chest pain and leg swelling.  Gastrointestinal:  Negative for abdominal pain, diarrhea, nausea and vomiting.  Neurological:  Positive for weakness. Negative for dizziness and headaches.  Psychiatric/Behavioral:  Negative for confusion.     Vital Signs: BP 120/84   Pulse 68   Temp 98.2 F (36.8 C) (Oral)   Resp (!) 22   SpO2 96%    Physical Exam Vitals reviewed.  Constitutional:      General: He is not in acute distress.    Appearance: He is ill-appearing.  HENT:     Mouth/Throat:     Mouth: Mucous membranes are moist.  Cardiovascular:     Rate and Rhythm: Normal rate and regular rhythm.     Pulses: Normal pulses.     Heart sounds: Normal heart sounds.  Pulmonary:     Effort: Pulmonary effort is normal.     Breath sounds: Normal breath sounds.  Abdominal:     General: Bowel sounds are normal.     Palpations: Abdomen is soft.     Tenderness: There is no abdominal tenderness.  Musculoskeletal:     Right lower leg: No edema.     Left lower leg: No edema.  Skin:    General: Skin is warm and dry.  Neurological:     Mental Status: He is alert and oriented to person, place, and time.  Psychiatric:        Mood and Affect: Mood normal.        Behavior: Behavior normal.        Thought Content: Thought content normal.        Judgment: Judgment normal.     Imaging: CT Angio Chest PE W and/or Wo Contrast  Result Date: 12/28/2022 CLINICAL DATA:  Headache, short of breath, weakness, history of endocarditis, abdominal pain, nonlocalized right upper quadrant pain EXAM: CT ANGIOGRAPHY CHEST CT ABDOMEN AND PELVIS WITH CONTRAST  TECHNIQUE: Multidetector CT imaging of the chest was performed using the standard protocol during bolus administration of intravenous contrast. Multiplanar CT image reconstructions and MIPs were obtained to evaluate the vascular anatomy. Multidetector CT imaging of the abdomen and pelvis was performed using the standard protocol during bolus administration of intravenous contrast. RADIATION DOSE REDUCTION: This exam was performed according to the departmental dose-optimization program which includes automated exposure control, adjustment of the mA and/or kV according to patient size and/or use of iterative reconstruction technique. CONTRAST:  OMNIPAQUE IOHEXOL 350 MG/ML SOLN COMPARISON:  05/10/2022, 10/23/2021 FINDINGS: CTA CHEST FINDINGS Cardiovascular: This is a technically adequate evaluation of the pulmonary vasculature. No filling defects or pulmonary emboli. The heart is enlarged, with no evidence of pericardial effusion. There is calcification of the aortic and mitral valves. Normal caliber of the thoracic aorta. No evidence of aneurysm or dissection. Postsurgical changes from prior CABG. Atherosclerosis of the aorta and native coronary vessels. Mediastinum/Nodes: No enlarged mediastinal, hilar, or axillary lymph nodes. Thyroid gland, trachea, and esophagus demonstrate no significant findings. Lungs/Pleura: There is a large right pleural effusion, volume estimated in excess of 1 L. minimal compressive atelectasis of the right lower lobe. No airspace disease or pneumothorax. Minimal mucoid material within the trachea. Otherwise the central airways are patent. Musculoskeletal: No acute or destructive bony lesions. Reconstructed images demonstrate no additional findings. Review of the MIP images confirms the above findings. CT ABDOMEN and PELVIS FINDINGS Hepatobiliary: Since the  prior exam, the complex fluid collection along the ventral aspect of the liver has enlarged, measuring 15.8 x 8.6 by 9.8 cm on  today's exam. Continued mural thickening with internal septations concerning for subcapsular or Peri hepatic abscess. This is amenable to percutaneous sampling if not previously performed. The remainder of the liver is unremarkable. Gallbladder is surgically absent. No biliary duct dilation. Pancreas: Unremarkable. No pancreatic ductal dilatation or surrounding inflammatory changes. Spleen: Normal in size without focal abnormality. Adrenals/Urinary Tract: Stable bilateral renal cysts do not require specific imaging follow-up. Stable nonobstructing right renal calculi, measuring up to 5 mm. No obstructive uropathy within either kidney. The adrenals and bladder are unremarkable. Stomach/Bowel: No bowel obstruction or ileus. Diffuse colonic diverticulosis without evidence of diverticulitis. No bowel wall thickening. Vascular/Lymphatic: Aortic atherosclerosis. No enlarged abdominal or pelvic lymph nodes. Reproductive: Mild enlargement of the prostate unchanged. Other: No free fluid or free intraperitoneal gas. Evidence of prior umbilical hernia repair. Stable fat containing bilateral inguinal hernias. No bowel herniation. Musculoskeletal: No acute or destructive bony lesions. Reconstructed images demonstrate no additional findings. Review of the MIP images confirms the above findings. IMPRESSION: Chest: 1. No evidence of pulmonary embolus. 2. Large right pleural effusion, volume estimated in excess of 1 L. 3. Cardiomegaly. 4. Aortic and mitral valve calcifications. 5.  Aortic Atherosclerosis (ICD10-I70.0). Abdomen/pelvis: 1. Enlarging 15.8 x 8.6 x 9.8 cm complex fluid collection along the ventral margin of the liver, increased since prior exam and consistent with enlarging subcapsular or perihepatic abscess. This is amenable to percutaneous aspiration/drainage. 2. Nonobstructing right renal calculi. 3. Diffuse colonic diverticulosis without diverticulitis. 4.  Aortic Atherosclerosis (ICD10-I70.0). Electronically Signed    By: Sharlet Salina M.D.   On: 12/28/2022 14:57   CT ABDOMEN PELVIS W CONTRAST  Result Date: 12/28/2022 CLINICAL DATA:  Headache, short of breath, weakness, history of endocarditis, abdominal pain, nonlocalized right upper quadrant pain EXAM: CT ANGIOGRAPHY CHEST CT ABDOMEN AND PELVIS WITH CONTRAST TECHNIQUE: Multidetector CT imaging of the chest was performed using the standard protocol during bolus administration of intravenous contrast. Multiplanar CT image reconstructions and MIPs were obtained to evaluate the vascular anatomy. Multidetector CT imaging of the abdomen and pelvis was performed using the standard protocol during bolus administration of intravenous contrast. RADIATION DOSE REDUCTION: This exam was performed according to the departmental dose-optimization program which includes automated exposure control, adjustment of the mA and/or kV according to patient size and/or use of iterative reconstruction technique. CONTRAST:  OMNIPAQUE IOHEXOL 350 MG/ML SOLN COMPARISON:  05/10/2022, 10/23/2021 FINDINGS: CTA CHEST FINDINGS Cardiovascular: This is a technically adequate evaluation of the pulmonary vasculature. No filling defects or pulmonary emboli. The heart is enlarged, with no evidence of pericardial effusion. There is calcification of the aortic and mitral valves. Normal caliber of the thoracic aorta. No evidence of aneurysm or dissection. Postsurgical changes from prior CABG. Atherosclerosis of the aorta and native coronary vessels. Mediastinum/Nodes: No enlarged mediastinal, hilar, or axillary lymph nodes. Thyroid gland, trachea, and esophagus demonstrate no significant findings. Lungs/Pleura: There is a large right pleural effusion, volume estimated in excess of 1 L. minimal compressive atelectasis of the right lower lobe. No airspace disease or pneumothorax. Minimal mucoid material within the trachea. Otherwise the central airways are patent. Musculoskeletal: No acute or destructive bony  lesions. Reconstructed images demonstrate no additional findings. Review of the MIP images confirms the above findings. CT ABDOMEN and PELVIS FINDINGS Hepatobiliary: Since the prior exam, the complex fluid collection along the ventral aspect of the liver has  enlarged, measuring 15.8 x 8.6 by 9.8 cm on today's exam. Continued mural thickening with internal septations concerning for subcapsular or Peri hepatic abscess. This is amenable to percutaneous sampling if not previously performed. The remainder of the liver is unremarkable. Gallbladder is surgically absent. No biliary duct dilation. Pancreas: Unremarkable. No pancreatic ductal dilatation or surrounding inflammatory changes. Spleen: Normal in size without focal abnormality. Adrenals/Urinary Tract: Stable bilateral renal cysts do not require specific imaging follow-up. Stable nonobstructing right renal calculi, measuring up to 5 mm. No obstructive uropathy within either kidney. The adrenals and bladder are unremarkable. Stomach/Bowel: No bowel obstruction or ileus. Diffuse colonic diverticulosis without evidence of diverticulitis. No bowel wall thickening. Vascular/Lymphatic: Aortic atherosclerosis. No enlarged abdominal or pelvic lymph nodes. Reproductive: Mild enlargement of the prostate unchanged. Other: No free fluid or free intraperitoneal gas. Evidence of prior umbilical hernia repair. Stable fat containing bilateral inguinal hernias. No bowel herniation. Musculoskeletal: No acute or destructive bony lesions. Reconstructed images demonstrate no additional findings. Review of the MIP images confirms the above findings. IMPRESSION: Chest: 1. No evidence of pulmonary embolus. 2. Large right pleural effusion, volume estimated in excess of 1 L. 3. Cardiomegaly. 4. Aortic and mitral valve calcifications. 5.  Aortic Atherosclerosis (ICD10-I70.0). Abdomen/pelvis: 1. Enlarging 15.8 x 8.6 x 9.8 cm complex fluid collection along the ventral margin of the liver,  increased since prior exam and consistent with enlarging subcapsular or perihepatic abscess. This is amenable to percutaneous aspiration/drainage. 2. Nonobstructing right renal calculi. 3. Diffuse colonic diverticulosis without diverticulitis. 4.  Aortic Atherosclerosis (ICD10-I70.0). Electronically Signed   By: Sharlet Salina M.D.   On: 12/28/2022 14:57   CT HEAD WO CONTRAST ( )  Result Date: 12/28/2022 CLINICAL DATA:  Headache, shortness of breath, weakness, on Eliquis EXAM: CT HEAD WITHOUT CONTRAST TECHNIQUE: Contiguous axial images were obtained from the base of the skull through the vertex without intravenous contrast. RADIATION DOSE REDUCTION: This exam was performed according to the departmental dose-optimization program which includes automated exposure control, adjustment of the mA and/or kV according to patient size and/or use of iterative reconstruction technique. COMPARISON:  10/23/2021 FINDINGS: Brain: No evidence of acute infarction, hemorrhage, mass, mass effect, or midline shift. No hydrocephalus or extra-axial fluid collection. Periventricular white matter changes, likely the sequela of chronic small vessel ischemic disease. Cerebral volume is within normal limits for age. Vascular: No hyperdense vessel. Atherosclerotic calcifications in the intracranial carotid and vertebral arteries. Skull: Negative for fracture or focal lesion. Sinuses/Orbits: No acute finding. Status post bilateral lens replacements. Other: The mastoid air cells are well aerated. IMPRESSION: No acute intracranial process. Electronically Signed   By: Wiliam Ke M.D.   On: 12/28/2022 14:46   DG Chest 2 View  Result Date: 12/28/2022 CLINICAL DATA:  SOB EXAM: CHEST - 2 VIEW COMPARISON:  12/26/2022. FINDINGS: Enlarged cardiac silhouette. Small right-sided pleural effusion. No pneumothorax. Calcified aorta. Status post median sternotomy and CABG. IMPRESSION: Enlarged cardiac silhouette.  Small right-sided pleural  effusion. Electronically Signed   By: Layla Maw M.D.   On: 12/28/2022 12:36   DG Chest 2 View  Result Date: 12/26/2022 CLINICAL DATA:  Dyspnea. Pleural effusion post thoracentesis last week. EXAM: CHEST - 2 VIEW COMPARISON:  Radiographs 12/22/2022 and 12/21/2022.  CT 10/23/2021. FINDINGS: The heart size and mediastinal contours are stable status post median sternotomy and CABG. Small residual right pleural effusion without significant recurrence. There is no pneumothorax, edema or confluent airspace opacity. The bones appear unchanged. IMPRESSION: Small residual right pleural effusion without significant  recurrence. No acute cardiopulmonary process. Electronically Signed   By: Carey Bullocks M.D.   On: 12/26/2022 13:09   US THORACENTESIS ASP PLEURAL SPACE W/IMG GUIDE  Result Date: 12/22/2022 INDICATION: Right pleural effusion EXAM: ULTRASOUND GUIDED RIGHT THORACENTESIS MEDICATIONS: 8 cc 1% lidocaine COMPLICATIONS: None immediate. PROCEDURE: An ultrasound guided thoracentesis was thoroughly discussed with the patient and questions answered. The benefits, risks, alternatives and complications were also discussed. The patient understands and wishes to proceed with the procedure. Written consent was obtained. Ultrasound was performed to localize and mark an adequate pocket of fluid in the right chest. The area was then prepped and draped in the normal sterile fashion. 1% Lidocaine was used for local anesthesia. Under ultrasound guidance a 6 Fr Safe-T-Centesis catheter was introduced. Thoracentesis was performed. The catheter was removed and a dressing applied. FINDINGS: A total of approximately 1.0 L of cloudy yellow fluid was removed. Samples were sent to the laboratory as requested by the clinical team. IMPRESSION: Successful ultrasound guided right thoracentesis yielding 1.0 L of pleural fluid. Follow-up chest x-ray revealed no evidence of pneumothorax. Read by: Mina Marble, PA-C Electronically  Signed   By: Judie Petit.  Shick M.D.   On: 12/22/2022 14:45   DG Chest 2 View  Result Date: 12/22/2022 CLINICAL DATA:  82 year old male with a history of right pleural effusion status post thoracentesis in February. Recurrence. EXAM: CHEST - 2 VIEW COMPARISON:  Post thoracentesis portable chest x-ray 10/13/2022 and earlier. FINDINGS: PA and lateral views on 12/21/2022. Previous CABG. Stable cardiac size and mediastinal contours. Visualized tracheal air column is within normal limits. The left lung appears stable and clear. Small to moderate right pleural effusion appears to be layering, and is increased since February. No pneumothorax or pulmonary edema. No acute osseous abnormality identified. Negative visible bowel gas. IMPRESSION: 1. Small to moderate right pleural effusion, progressed since February. 2. No other acute cardiopulmonary abnormality. Electronically Signed   By: Odessa Fleming M.D.   On: 12/22/2022 14:25   DG Chest Port 1 View  Result Date: 12/22/2022 CLINICAL DATA:  82 year old male status post ultrasound guided right side thoracentesis this afternoon. EXAM: PORTABLE CHEST 1 VIEW COMPARISON:  Ultrasound thoracentesis images 13 38 hours. Two view chest yesterday and earlier. FINDINGS: Portable AP upright view at 1410 hours. Substantially regressed, nearly resolved right pleural effusion. No pneumothorax. Stable cardiac size and mediastinal contours. Prior CABG. The left lung remains clear when allowing portable technique. No acute osseous abnormality identified. Paucity of bowel gas in the visible abdomen. IMPRESSION: Nearly resolved right pleural effusion following thoracentesis. No pneumothorax or other acute cardiopulmonary abnormality. Electronically Signed   By: Odessa Fleming M.D.   On: 12/22/2022 14:23    Labs:  CBC: Recent Labs    12/21/22 1455 12/26/22 1405 12/28/22 1135 12/29/22 0329  WBC 11.6* 10.2 10.6* 9.8  HGB 12.0* 12.0* 12.1* 10.1*  HCT 37.0* 36.6* 38.6* 32.2*  PLT 340.0 544.0* 627*  471*    COAGS: Recent Labs    12/29/22 0329  INR 1.6*    BMP: Recent Labs    04/25/22 1113 05/17/22 0759 12/21/22 1455 12/27/22 0807 12/28/22 1135 12/29/22 0329  NA 140   < > 139 137 136 138  K 4.2   < > 4.4 5.1 4.4 4.2  CL 110   < > 103 101 103 108  CO2 23   < > 23 23 24 22   GLUCOSE 122*   < > 127* 117* 136* 115*  BUN 29*   < >  27* 58* 41* 33*  CALCIUM 9.1   < > 8.8 8.8 8.8* 8.3*  CREATININE 1.44*   < > 1.43 1.34 1.17 1.07  GFRNONAA 49*  --   --   --  >60 >60   < > = values in this interval not displayed.    LIVER FUNCTION TESTS: Recent Labs    06/20/22 0729 12/21/22 1455 12/27/22 0807 12/28/22 1135 12/28/22 1233  BILITOT 0.5 1.1  --  0.6 0.4  AST 22 8  --  32 26  ALT 13 4  --  15 11  ALKPHOS 64 47  --  65 74  PROT 6.8 6.5  --  6.8 6.1  ALBUMIN 3.2* 3.3* 2.9* 2.5* 2.9*    TUMOR MARKERS: No results for input(s): "AFPTM", "CEA", "CA199", "CHROMGRNA" in the last 8760 hours.  Assessment and Plan:  [name] Young is an 82 yo male being seen today in relation to a hepatic abscess and a right pleural effusion. The patient has an enlarging 15.8 x 8.6 x 9.8 cm complex fluid collection of the ventral liver concerning for abscess, as well as a large right pleural effusion. Case has been reviewed with Dr Lowella Dandy and is approved for image-guided hepatic abscess drain placement with possible image-guided thoracentesis. Patient's INR was elevated at 1.6, patient is NPO. Patient reports last taking his regular Eliquis yesterday morning.  Risks and benefits of drain placement discussed with the patient including bleeding, infection, damage to adjacent structures, bowel perforation/fistula connection, and sepsis.  Risks and benefits of thoracentesis discussed with the patient including risk of bleeding, infection, and possibility of pneumothorax.  All of the patient's questions were answered, patient is agreeable to proceed. Consent signed and in IR suite.   Thank you for  this interesting consult.  I greatly enjoyed meeting [name] Young. and look forward to participating in their care.  A copy of this report was sent to the requesting provider on this date.  Electronically Signed: Kennieth Francois, PA-C 12/29/2022, 9:38 AM   I spent a total of 40 Minutes  in face to face in clinical consultation, greater than 50% of which was counseling/coordinating care for hepatic abscess and right pleural effusion

## 2022-12-29 NOTE — Progress Notes (Signed)
Noted Palliative Care consult cancelled. Confirmed with Dr. Chipper Herb. Explained to family of cancelled consult-no need for meeting tomorrow.  No Charge.  Leeanne Deed, DNP, AGNP-C Palliative Medicine  Please call Palliative Medicine team phone with any questions [phone]. For individual providers please see AMION.

## 2022-12-29 NOTE — Procedures (Signed)
Interventional Radiology Procedure:   Indications: Perihepatic abscess and right pleural effusion  Procedure: 1) Placement of perihepatic drain 2) US guided right thoracentesis  Findings: 12 Fr drain placed in perihepatic abscess and 550 ml of purulent fluid removed.   900 ml of yellow fluid removed from right chest  Complications: None     EBL: Minimal, less than 10 ml  Plan: Portable CXR ordered.  Sending drain fluid for culture.    Sparkles Mcneely R. Lowella Dandy, MD  Pager: [phone]

## 2022-12-29 NOTE — Progress Notes (Signed)
*  PRELIMINARY RESULTS* Echocardiogram 2D Echocardiogram has been attempted. Patient is in Special Procedures.  Carolyne Fiscal 12/29/2022, 1:51 PM

## 2022-12-29 NOTE — Telephone Encounter (Signed)
Lvm asking pt/pt's wife, [name] (on dpr) to call back. Need update on pt's sxs.   [Also, lvm on 12/28/22-- see Labs, Result Notes- 12/26/22.]

## 2022-12-29 NOTE — Progress Notes (Signed)
Progress Note   Patient: [name] Young. ZOX:[id number] DOB: 1940-12-05 DOA: 12/28/2022     1 DOS: the patient was seen and examined on 12/29/2022   Brief hospital course: [name] Young, [name] Young. 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 Showed large right pleural effusion, large liver abscess of 15.8 x8.6 x9.8 cm. Patient is placed on broad-spectrum antibiotics changed to Zosyn.  IR drain performed on 4/19.    Principal Problem:   Hepatic abscess Active Problems:   Essential hypertension   GERD (gastroesophageal reflux disease)   Benign prostatic hyperplasia   DNR (do not resuscitate)   S/P CABG x 3   FTT (failure to thrive) in adult   Paroxysmal atrial fibrillation   Chronic HFrEF (heart failure with reduced ejection fraction)   Shortness of breath   History of endocarditis   History of bacteremia   Protein-calorie malnutrition, severe   Reactive thrombocytosis   Pleural effusion, right   Assessment and Plan:  * Hepatic abscess Right large pleural effusion Patient did not meet sepsis criteria at time of admission, patient has a very large liver abscess.  Antibiotic changed to Zosyn, IR drain will be performed today, with culture sent out. CT scan also showed large right pleural effusion which is recurrent.  Thoracentesis will be performed at the same time with lab and culture sent. Blood cultures so far no growth.  Hepatic abscess probably due to bacteremia from last year. Will discuss with ID once culture results available. Patient  had a shortness of breath likely due to combination of elevated diaphragm and the right large pleural effusion.  Protein-calorie malnutrition, severe Obesity with BMI 30.7. FTT (failure to thrive) in adult Patient has been having poor appetite for the last few months, he has significant weight loss.  Will start protein supplement with nutrition consult.  History of bacteremia and bacterial endocarditis. History of E. coli and Streptococcus bacteremia, repeat blood cultures so far negative. Echocardiogram repeated to evaluate valvular structure.  Paroxysmal atrial fibrillation Will restart Eliquis after the procedure.  Essential hypertension Chronic diastolic congestive heart failure. Previous echocardiogram showed ejection fraction 50 to 55%, repeat echocardiogram pending.  Currently patient does not have volume overload.      Subjective:  Currently patient denies any short of breath or cough.  Physical Exam: Vitals:   12/29/22 0900 12/29/22 0930 12/29/22 1000 12/29/22 1030  BP: (!) 123/57 126/65 127/63 128/61  Pulse: 74 73 73 79  Resp: (!) 21 (!) 26 (!) 26 18  Temp:      TempSrc:      SpO2: 99% 98% 100% 99%   General exam: Appears calm and comfortable  Respiratory system: Decreased breathing sound on right. Respiratory effort normal. Cardiovascular system: Regular. No JVD, murmurs, rubs, gallops or clicks. No pedal edema. Gastrointestinal system: Abdomen is nondistended, soft and nontender. No organomegaly or masses felt. Normal bowel sounds heard. Central nervous system: Alert and oriented. No focal neurological deficits. Extremities: Symmetric 5 x 5 power. Skin: No rashes, lesions or ulcers Psychiatry: Judgement and insight appear normal. Mood & affect appropriate.    Data Reviewed:  Reviewed prior echocardiogram results, prior notes.  Imaging study at this admission, all lab results.  Family Communication: Wife and daughter updated at the  bedside.  Disposition: Status is: Inpatient Remains inpatient appropriate because: Severity of disease, IV treatment.     Time spent: 55 minutes  Author: Marrion Coy, MD 12/29/2022 1:03 PM  For on call review [email or web address].

## 2022-12-29 NOTE — Progress Notes (Signed)
Palliative consult received.  Visited [name] Young at his bedside. Just returned to room from IR procedures. Resting comfortably after being administered pain medication-unable to participate in conversation at this time. Wife and daughter present at bedside. Meeting scheduled for tomorrow 4/20 at 11AM.  No Charge.  Leeanne Deed, DNP, AGNP-C Palliative Medicine  Please call Palliative Medicine team phone with any questions [phone]. For individual providers please see AMION.

## 2022-12-29 NOTE — Consult Note (Signed)
Pharmacy Antibiotic Note  [name] Young. is a 82 y.o. male admitted on 12/28/2022 with sepsis and hx of endocarditis.  Pharmacy has been consulted for cefepime and vancomycin dosing.  Plan: Change vancomycin to 1750 mg IV every 24 hours Estimated AUC 472, Cmin 10.9 Scr 1.07, wt 98.4 kg, Vd coefficient 0.72 Vancomycin levels at steady state or as clinically indicated Continue cefepime 2 grams IV every 8 hours Follow renal function and cultures for adjustments Flagyl 500 mg IV every 12 hours ordered by provider     Temp (24hrs), Avg:98 F (36.7 C), Min:97.8 F (36.6 C), Max:98.2 F (36.8 C)  Recent Labs  Lab 12/26/22 1405 12/27/22 0807 12/28/22 1135 12/28/22 1542 12/29/22 0329  WBC 10.2  --  10.6*  --  9.8  CREATININE  --  1.34 1.17  --  1.07  LATICACIDVEN  --   --   --  1.2  --      Estimated Creatinine Clearance: 65.3 mL/min (by C-G formula based on SCr of 1.07 mg/dL).    No Known Allergies  Antimicrobials this admission: 4/18 cefepime >>  4/18 vancomycin >>  4/18 Flagyl >>  Dose adjustments this admission: Vancomycin 1250 mg q24h changed to 1750 q24h  Microbiology results: 4/18 BCx: ngtd  Thank you for allowing pharmacy to be a part of this patient's care.  Barrie Folk, PharmD 12/29/2022 10:17 AM

## 2022-12-30 ENCOUNTER — Inpatient Hospital Stay (HOSPITAL_COMMUNITY)
Admit: 2022-12-30 | Discharge: 2022-12-30 | Disposition: A | Discharge status: Home / Self Care | Payer: Medicare PPO | Attending: Internal Medicine | Admitting: Internal Medicine

## 2022-12-30 DIAGNOSIS — E872 Acidosis, unspecified: Secondary | ICD-10-CM | POA: Insufficient documentation

## 2022-12-30 DIAGNOSIS — I38 Endocarditis, valve unspecified: Secondary | ICD-10-CM | POA: Diagnosis not present

## 2022-12-30 DIAGNOSIS — I480 Paroxysmal atrial fibrillation: Secondary | ICD-10-CM

## 2022-12-30 DIAGNOSIS — K750 Abscess of liver: Secondary | ICD-10-CM | POA: Diagnosis not present

## 2022-12-30 DIAGNOSIS — R0609 Other forms of dyspnea: Secondary | ICD-10-CM

## 2022-12-30 DIAGNOSIS — J90 Pleural effusion, not elsewhere classified: Secondary | ICD-10-CM | POA: Diagnosis not present

## 2022-12-30 LAB — T3, REVERSE: T3, Reverse: 41 ng/dL — ABNORMAL HIGH (ref 8–25)

## 2022-12-30 LAB — T3: T3, Total: 91 ng/dL (ref 76–181)

## 2022-12-30 LAB — BASIC METABOLIC PANEL
Anion gap: 11 (ref 5–15)
BUN: 32 mg/dL — ABNORMAL HIGH (ref 8–23)
CO2: 19 mmol/L — ABNORMAL LOW (ref 22–32)
Calcium: 8.3 mg/dL — ABNORMAL LOW (ref 8.9–10.3)
Chloride: 110 mmol/L (ref 98–111)
Creatinine, Ser: 1.07 mg/dL (ref 0.61–1.24)
GFR, Estimated: >60 mL/min (ref >60)
Glucose, Bld: 108 mg/dL — ABNORMAL HIGH (ref 70–99)
Potassium: 4.1 mmol/L (ref 3.5–5.1)
Sodium: 140 mmol/L (ref 135–145)

## 2022-12-30 LAB — ECHOCARDIOGRAM COMPLETE
AR max vel: 2.12 cm2
AV Area VTI: 2.83 cm2
AV Area mean vel: 2 cm2
AV Mean grad: 15 mmHg
AV Peak grad: 28 mmHg
Ao pk vel: 2.65 m/s
Area-P 1/2: 2.28 cm2
Height: 70.5 in
MV VTI: 4.01 cm2
P 1/2 time: 638 msec
S' Lateral: 3.5 cm
Single Plane A4C EF: 60.3 %
Weight: 3472 oz

## 2022-12-30 LAB — CBC
HCT: 33.8 % — ABNORMAL LOW (ref 39.0–52.0)
Hemoglobin: 10.6 g/dL — ABNORMAL LOW (ref 13.0–17.0)
MCH: 27.3 pg (ref 26.0–34.0)
MCHC: 31.4 g/dL (ref 30.0–36.0)
MCV: 87.1 fL (ref 80.0–100.0)
Platelets: 514 10*3/uL — ABNORMAL HIGH (ref 150–400)
RBC: 3.88 MIL/uL — ABNORMAL LOW (ref 4.22–5.81)
RDW: 16 % — ABNORMAL HIGH (ref 11.5–15.5)
WBC: 13.9 10*3/uL — ABNORMAL HIGH (ref 4.0–10.5)
nRBC: 0 % (ref 0.0–0.2)

## 2022-12-30 LAB — AEROBIC/ANAEROBIC CULTURE W GRAM STAIN (SURGICAL/DEEP WOUND)

## 2022-12-30 LAB — CULTURE, BLOOD (ROUTINE X 2)
Culture: NO GROWTH
Special Requests: ADEQUATE

## 2022-12-30 LAB — MAGNESIUM: Magnesium: 1.9 mg/dL (ref 1.7–2.4)

## 2022-12-30 MED ORDER — APIXABAN 5 MG PO TABS
5.0000 mg | ORAL_TABLET | Freq: Two times a day (BID) | ORAL | Status: DC
  Administered 2022-12-30 – 2023-01-05 (×13): 5 mg via ORAL
  Filled 2022-12-30 (×13): qty 1

## 2022-12-30 MED ORDER — LACTULOSE 10 GM/15ML PO SOLN
20.0000 g | Freq: Once | ORAL | Status: AC
  Administered 2022-12-30: 20 g via ORAL
  Filled 2022-12-30: qty 30

## 2022-12-30 MED ORDER — VITAMIN B-12 1000 MCG PO TABS
1000.0000 ug | ORAL_TABLET | Freq: Every day | ORAL | Status: DC
  Administered 2022-12-31 – 2023-01-05 (×6): 1000 ug via ORAL
  Filled 2022-12-30: qty 1
  Filled 2022-12-30: qty 10
  Filled 2022-12-30 (×3): qty 1
  Filled 2022-12-30: qty 10
  Filled 2022-12-30: qty 1

## 2022-12-30 MED ORDER — B-12 1000 MCG PO CAPS: ORAL_CAPSULE | Freq: Every day | ORAL | Status: DC

## 2022-12-30 MED ORDER — TAMSULOSIN HCL 0.4 MG PO CAPS
0.4000 mg | ORAL_CAPSULE | Freq: Every day | ORAL | Status: DC
  Administered 2022-12-30 – 2023-01-05 (×7): 0.4 mg via ORAL
  Filled 2022-12-30 (×7): qty 1

## 2022-12-30 MED ORDER — SODIUM BICARBONATE 650 MG PO TABS
650.0000 mg | ORAL_TABLET | Freq: Two times a day (BID) | ORAL | Status: DC
  Administered 2022-12-30 – 2023-01-03 (×9): 650 mg via ORAL
  Filled 2022-12-30 (×9): qty 1

## 2022-12-30 NOTE — Evaluation (Signed)
Occupational Therapy Evaluation Patient Details Name: [name] Young. MRN: [id number] DOB: 1940-11-08 Today's Date: 12/30/2022   History of Present Illness [name] Young is a 82 y/o male with PMH including CABGx3, a-fib, and shortness of breath. Admitted for hepatic absecess with symptoms of shortness of breath and weakness for a few days.   Clinical Impression   Patient received for OT evaluation. See flowsheet below for details of function. Generally, patient requiring HOB raised and extra time for bed mobility, CGA with RW for taking sidesteps today (very decreased activity tolerance; not able to mobilize a household distance), and set up-MOD A/MAX A for ADLs. Patient will benefit from continued OT while in acute care.       Recommendations for follow up therapy are one component of a multi-disciplinary discharge planning process, led by the attending physician.  Recommendations may be updated based on patient status, additional functional criteria and insurance authorization.   Assistance Recommended at Discharge Frequent or constant Supervision/Assistance  Patient can return home with the following A little help with walking and/or transfers;A little help with bathing/dressing/bathroom;Assistance with cooking/housework;Direct supervision/assist for medications management;Direct supervision/assist for financial management;Assist for transportation;Help with stairs or ramp for entrance    Functional Status Assessment  Patient has had a recent decline in their functional status and demonstrates the ability to make significant improvements in function in a reasonable and predictable amount of time.  Equipment Recommendations  BSC/3in1    Recommendations for Other Services       Precautions / Restrictions Precautions Precautions: Fall Restrictions Weight Bearing Restrictions: No      Mobility Bed Mobility Overal bed mobility: Modified Independent             General  bed mobility comments: HOB raised, much increased time, cues. Pt not needing any physical assistance for bed mobility today. Pt states that at home he's been sleeping in lift recliner the past few days due to decreased ability to perform bed mobility    Transfers Overall transfer level: Needs assistance Equipment used: Rolling walker (2 wheels) Transfers: Sit to/from Stand Sit to Stand: From elevated surface, Min guard           General transfer comment: cues for hand placement (L hand on bed to help push up), as pt wanting BIL hands on RW      Balance Overall balance assessment: Needs assistance Sitting-balance support: Feet supported, Bilateral upper extremity supported Sitting balance-Leahy Scale: Fair     Standing balance support: Reliant on assistive device for balance Standing balance-Leahy Scale: Fair Standing balance comment: sidestepped CGA to the R approx 2 feet with RW; cues for RW use.                           ADL either performed or assessed with clinical judgement   ADL Overall ADL's : Needs assistance/impaired                                       General ADL Comments: Pt unable to tolerate more than approx 5 minutes of sitting EOB today, and pt relying on BIL hands to maintain sitting balance, therefore unable to engage in ADL tasks at EOB; would need to be seated with back support for such activities at this time. Limitation likely 2/2 abdominal pain. Anticipate significant assist for LB dressing/bathing and toilet hygiene at this  time. Pt was able to stand and sidestep with RW, so likely able to t/f onto high commode (bed raised during t/f) with arm rests. Unable/unwilling to attempt LB clothing trial (unable to lean forward and unable to make figure four). Good use of UE for bed mobility, therefore likely WNL UE, but fatigues quickly. Feels short of breath seated at EOB.     Vision         Perception     Praxis      Pertinent  Vitals/Pain Pain Assessment Pain Assessment: 0-10 Pain Score:  (unrated; moderate) Pain Location: abdomen when he coughs or moves Pain Descriptors / Indicators: Aching Pain Intervention(s): Limited activity within patient's tolerance, Monitored during session, Patient requesting pain meds-RN notified     Hand Dominance     Extremity/Trunk Assessment         Cervical / Trunk Assessment Cervical / Trunk Assessment:  (pt leaning forward on RW during sidesteps)   Communication Communication Communication: No difficulties   Cognition Arousal/Alertness: Awake/alert Behavior During Therapy: WFL for tasks assessed/performed Overall Cognitive Status: Within Functional Limits for tasks assessed                                 General Comments: Not formally tested; pt able to provide information about home set up; assisted by wife.     General Comments       Exercises     Shoulder Instructions      Home Living Family/patient expects to be discharged to:: Private residence Living Arrangements: Spouse/significant other Available Help at Discharge: Family;Available 24 hours/day Type of Home: House Home Access: Stairs to enter [employer] of Steps: 3 Entrance Stairs-Rails: Right Home Layout: One level     Bathroom Shower/Tub: Chief Strategy Officer: Handicapped height     Home Equipment: Agricultural consultant (2 wheels);Cane - single point;Hand held shower head;Tub bench;Other (comment) (lift recliner)          Prior Functioning/Environment Prior Level of Function : Needs assist             Mobility Comments: Pt states he typically uses SPC MOD (I) for mobility. Denies falls (wife corroborates). ADLs Comments: Typically MOD(I) with most ADLs; has difficulty transferring into shower; has tub transfer bench at home, but currently not using. Wife has recently been assisting with donning socks and pt will don shoes and tie shoes from seated  position. Wife completes all IADLs, including placing pills in pill box, then pt will take the pills independently. Pt not driving anymore. Unable to state what hobbies he enjoys.        OT Problem List: Decreased strength;Decreased activity tolerance;Impaired balance (sitting and/or standing);Decreased knowledge of use of DME or AE;Cardiopulmonary status limiting activity      OT Treatment/Interventions: Self-care/ADL training;Therapeutic exercise;Therapeutic activities    OT Goals(Current goals can be found in the care plan section) Acute Rehab OT Goals OT Goal Formulation: With patient/family Time For Goal Achievement: 01/13/23 Potential to Achieve Goals: Good  OT Frequency: Min 2X/week    Co-evaluation              AM-PAC OT "6 Clicks" Daily Activity     Outcome Measure Help from another person eating meals?: None Help from another person taking care of personal grooming?: A Little Help from another person toileting, which includes using toliet, bedpan, or urinal?: A Little Help from another person bathing (including  washing, rinsing, drying)?: A Lot Help from another person to put on and taking off regular upper body clothing?: A Little Help from another person to put on and taking off regular lower body clothing?: A Lot 6 Click Score: 17   End of Session Equipment Utilized During Treatment: Rolling walker (2 wheels) Nurse Communication: Mobility status;Patient requests pain meds  Activity Tolerance: Patient limited by fatigue;Patient limited by pain Patient left: in bed;with call bell/phone within reach;with nursing/sitter in room;with family/visitor present (RN and family aware that bed alarm not on. HOB at 30 degrees.)  OT Visit Diagnosis: Unsteadiness on feet (R26.81)                Time: 0981-1914 OT Time Calculation (min): 29 min Charges:  OT General Charges $OT Visit: 1 Visit OT Evaluation $OT Eval Moderate Complexity: 1 Mod OT Treatments $Therapeutic  Activity: 8-22 mins  Linward Foster, MS, OTR/L  Alvester Morin 12/30/2022, 3:05 PM

## 2022-12-30 NOTE — Plan of Care (Signed)
  Problem: Education: Goal: Knowledge of General Education information will improve Description: Including pain rating scale, medication(s)/side effects and non-pharmacologic comfort measures Outcome: Progressing   Problem: Clinical Measurements: Goal: Will remain free from infection Outcome: Progressing   Problem: Clinical Measurements: Goal: Respiratory complications will improve Outcome: Progressing   Problem: Pain Managment: Goal: General experience of comfort will improve Outcome: Progressing   Problem: Safety: Goal: Ability to remain free from injury will improve Outcome: Progressing   

## 2022-12-30 NOTE — Progress Notes (Signed)
  Echocardiogram 2D Echocardiogram has been performed.  [name] Young 12/30/2022, 10:59 AM

## 2022-12-30 NOTE — Progress Notes (Signed)
Progress Note   Patient: [name] Young. NUU:[id number] DOB: 02/24/1941 DOA: 12/28/2022     2 DOS: the patient was seen and examined on 12/30/2022   Brief hospital course: [name], [name] Young. 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 Showed large right pleural effusion, large liver abscess of 15.8 x8.6 x9.8 cm. Patient is placed on broad-spectrum antibiotics changed to Zosyn.  IR drain performed on 4/19.  Gram stain showed positive for gram-negative rods.    Principal Problem:   Hepatic abscess Active Problems:   Essential hypertension   GERD (gastroesophageal reflux disease)   Benign prostatic hyperplasia   DNR (do not resuscitate)   S/P CABG x 3   FTT (failure to thrive) in adult   Paroxysmal atrial fibrillation   Chronic HFrEF (heart failure with reduced ejection fraction)   Shortness of breath   History of endocarditis   History of bacteremia   Protein-calorie malnutrition, severe   Reactive thrombocytosis   Pleural effusion, right   Metabolic acidosis   Assessment and Plan:  Hepatic abscess Right large pleural effusion Patient did not meet sepsis criteria at time of admission, patient has a very large liver abscess.  Antibiotic changed to Zosyn, CT scan also showed large right pleural effusion which is recurrent.  Blood cultures so far no growth.  Hepatic abscess probably due to bacteremia from last year. Will discuss with ID once culture results available. Patient had IR drain of abscess and a right-sided thoracentesis.  Removed  550 of purulent fluid from the liver, 900 mm of yellow pleural fluid. Pleural fluid consistent with exudates, likely reactive to abscess of the liver. Gram stain from abscess positive for gram-negative rods, will continue current antibiotic with Zosyn until the final culture results available. Patient short of breath much improved today after procedure.   Protein-calorie malnutrition, severe Obesity with BMI 30.7. FTT (failure to thrive) in adult Patient has been having poor appetite for the last few months, he has significant weight loss.  Will start protein supplement with nutrition consult.   History of bacteremia and bacterial endocarditis. History of E. coli and Streptococcus bacteremia, repeat blood cultures negative. Echocardiogram repeated to evaluate valvular structure, results still pending.   Paroxysmal atrial fibrillation Restart Eliquis.   Essential hypertension Chronic diastolic congestive heart failure. Previous echocardiogram showed ejection fraction 50 to 55%, repeat echocardiogram pending.  Currently patient does not have volume overload.      Subjective:  Patient feels much better with breathing.  Still significant weakness.  Physical Exam: Vitals:   12/29/22 2342 12/30/22 0426 12/30/22 0915 12/30/22 1203  BP: (!) 135/58 (!) 111/59 (!) 138/37 (!) 109/50  Pulse: (!) 47 94 89   Resp: Temp: 98.7 F (37.1 C) 97.6 F (36.4 C) 97.7 F (36.5 C) 98.8 F (37.1 C)  TempSrc:  Oral Oral Axillary  SpO2: 97% 97% 96% 98%   General exam: Appears calm and comfortable  Respiratory system: Clear to auscultation. Respiratory effort normal. Cardiovascular system: S1 & S2 heard, RRR. No JVD, murmurs, rubs, gallops or clicks. No pedal edema. Gastrointestinal system: Abdomen is nondistended, soft and nontender. No  organomegaly or masses felt. Normal bowel sounds heard. Central nervous system: Alert and oriented. No focal neurological deficits. Extremities:  Symmetric 5 x 5 power. Skin: No rashes, lesions or ulcers Psychiatry: Judgement and insight appear normal. Mood & affect appropriate.    Data Reviewed:  Lab results reviewed.  Family Communication: Wife updated at bedside.  Disposition: Status is: Inpatient Remains inpatient appropriate because: Severity of disease, IV treatment.     Time spent: 55 minutes  Author: Marrion Coy, MD 12/30/2022 1:19 PM  For on call review [email or web address].

## 2022-12-30 NOTE — Evaluation (Signed)
Physical Therapy Evaluation Patient Details Name: [name] Young. MRN: [id number] DOB: 30-Jan-1941 Today's Date: 12/30/2022  History of Present Illness  [name] Young is a 82 y/o male with PMH including CABGx3, a-fib, and shortness of breath. Presented to ER secondary to SOB, progressive weakness; admitted for management of hepatic abscess (s/p perihepatic drain) and large R pleural effusion (s/p R thoracentesis)  Clinical Impression  Patient resting in bed upon arrival to room; multiple supportive family members present in room. Patient alert and oriented to basic information; follows commands and agreeable to participation with session, min encouragement from therapist and family required.  Endorses persistent pain in R LQ, FACES 6/10; additional meds not available per patient.  Bilat UE/LE generally weak and deconditioned, but grossly functional for basic transfers and mobility. No focal weakness appreciated. Currently requiring min assist for bed mobility; min assist for sit/stand, standing balance and forward/backward/lateral stepping with RW. Maintains forward flexed posture with heavy WBing bilat UEs. Unable tolerate full postural extension due to abdominal pain. Declined additional gait efforts due to pain    Of note, patient generally self-directive of care and in movement techniques/sequences, but agreeable to participation when allowed his input.  Wife very focused and insistent on his consistent participation with hope of return home at discharge. Would benefit from skilled PT to address above deficits and promote optimal return to PLOF.; will benefit optimally from moderate intensity post-acute PT services (<3 hours/day) and consistent staff assist for ADLs/mobility.      Recommendations for follow up therapy are one component of a multi-disciplinary discharge planning process, led by the attending physician.  Recommendations may be updated based on patient status, additional functional  criteria and insurance authorization.  Follow Up Recommendations Can patient physically be transported by private vehicle: No     Assistance Recommended at Discharge Frequent or constant Supervision/Assistance  Patient can return home with the following  A lot of help with walking and/or transfers;A lot of help with bathing/dressing/bathroom    Equipment Recommendations    Recommendations for Other Services       Functional Status Assessment Patient has had a recent decline in their functional status and demonstrates the ability to make significant improvements in function in a reasonable and predictable amount of time.     Precautions / Restrictions Precautions Precautions: Fall Precaution Comments: R abdominal/perihepatic JP drain Restrictions Weight Bearing Restrictions: No      Mobility  Bed Mobility Overal bed mobility: Needs Assistance Bed Mobility: Supine to Sit, Sit to Supine     Supine to sit: Min assist Sit to supine: Mod assist   General bed mobility comments: prefers to complete as indep as possible; limited reception to cuing for technique    Transfers Overall transfer level: Needs assistance Equipment used: Rolling walker (2 wheels) Transfers: Sit to/from Stand Sit to Stand: Min assist, From elevated surface           General transfer comment: prefers bilat UEs on RW despite cuing; slow and deliberate, very dependant on RW and bilat UE support to complete    Ambulation/Gait Ambulation/Gait assistance: Min assist Gait Distance (Feet): 5 Feet Assistive device: Rolling walker (2 wheels)         General Gait Details: forward, backward, lateral stepping edge of bed; forward flexed posture with heavy WBing bilat UEs.  Unable tolerate full postural extension due to abdominal pain.  Declined additional gait efforts due to pain  Stairs  Wheelchair Mobility    Modified Rankin (Stroke Patients Only)       Balance Overall balance  assessment: Needs assistance Sitting-balance support: No upper extremity supported, Feet supported Sitting balance-Leahy Scale: Good     Standing balance support: Bilateral upper extremity supported Standing balance-Leahy Scale: Fair                               Pertinent Vitals/Pain Pain Assessment Pain Assessment: Faces Faces Pain Scale: Hurts even more Pain Location: abdomen when he coughs or moves Pain Descriptors / Indicators: Aching, Grimacing Pain Intervention(s): Limited activity within patient's tolerance, Monitored during session, Repositioned    Home Living Family/patient expects to be discharged to:: Private residence Living Arrangements: Spouse/significant other Available Help at Discharge: Family;Available 24 hours/day Type of Home: House Home Access: Stairs to enter Entrance Stairs-Rails: Right Entrance Stairs-Number of Steps: 3   Home Layout: One level Home Equipment: Agricultural consultant (2 wheels);Cane - single point;Hand held shower head;Tub bench;Other (comment)      Prior Function Prior Level of Function : Needs assist             Mobility Comments: Pt states he typically uses SPC MOD (I) for mobility. Denies falls (wife corroborates). ADLs Comments: Typically MOD(I) with most ADLs; has difficulty transferring into shower; has tub transfer bench at home, but currently not using. Wife has recently been assisting with donning socks and pt will don shoes and tie shoes from seated position. Wife completes all IADLs, including placing pills in pill box, then pt will take the pills independently. Pt not driving anymore. Unable to state what hobbies he enjoys.     Hand Dominance        Extremity/Trunk Assessment   Upper Extremity Assessment Upper Extremity Assessment: Generalized weakness (grossly 4-/5 throughout)    Lower Extremity Assessment Lower Extremity Assessment: Generalized weakness (grossly 3+ to 4-/5 throughout; endorses baseline  neuropathy ankles distally bilat LEs)    Cervical / Trunk Assessment Cervical / Trunk Assessment:  (pt leaning forward on RW during sidesteps)  Communication   Communication: No difficulties  Cognition Arousal/Alertness: Awake/alert Behavior During Therapy: WFL for tasks assessed/performed Overall Cognitive Status: Within Functional Limits for tasks assessed                                          General Comments      Exercises     Assessment/Plan    PT Assessment Patient needs continued PT services  PT Problem List Decreased strength;Decreased range of motion;Decreased activity tolerance;Decreased mobility;Decreased balance;Decreased coordination;Decreased knowledge of use of DME;Decreased knowledge of precautions;Decreased safety awareness       PT Treatment Interventions DME instruction;Gait training;Stair training;Functional mobility training;Therapeutic activities;Patient/family education;Therapeutic exercise;Balance training    PT Goals (Current goals can be found in the Care Plan section)  Acute Rehab PT Goals Patient Stated Goal: to return home PT Goal Formulation: With patient/family Time For Goal Achievement: 01/13/23 Potential to Achieve Goals: Good    Frequency Min 4X/week     Co-evaluation               AM-PAC PT "6 Clicks" Mobility  Outcome Measure Help needed turning from your back to your side while in a flat bed without using bedrails?: A Little Help needed moving from lying on your back to sitting on the  side of a flat bed without using bedrails?: A Lot Help needed moving to and from a bed to a chair (including a wheelchair)?: A Little Help needed standing up from a chair using your arms (e.g., wheelchair or bedside chair)?: A Little Help needed to walk in hospital room?: A Lot Help needed climbing 3-5 steps with a railing? : A Lot 6 Click Score: 15    End of Session   Activity Tolerance: Patient tolerated treatment  well;Patient limited by pain Patient left: in bed;with call bell/phone within reach;with bed alarm set;with family/visitor present Nurse Communication: Mobility status PT Visit Diagnosis: Muscle weakness (generalized) (M62.81);Difficulty in walking, not elsewhere classified (R26.2)    Time: 1610-9604 PT Time Calculation (min) (ACUTE ONLY): 29 min   Charges:   PT Evaluation $PT Eval Moderate Complexity: 1 Mod PT Treatments $Therapeutic Activity: 8-22 mins        Tarita Deshmukh H. Manson Passey, PT, DPT, NCS 12/30/22, 5:19 PM [phone]

## 2022-12-30 NOTE — Progress Notes (Signed)
Referring Physician(s): Dr. Londell Moh  Supervising Physician: Pernell Dupre  Patient Status:  [facility] [place] - In-pt  Chief Complaint: Peri-hepatic abscess s/p drain placement in IR 12/29/22 by Dr. Lowella Dandy. Patient also s/p thoracentesis for right pleural effusion.   Subjective: Patient resting in bed, his wife is at the bedside. Patient denies any significant pain or discomfort. No issues with drain.   Allergies: Patient has no known allergies.  Medications: Prior to Admission medications   Medication Sig Start Date End Date Taking? Authorizing Provider  apixaban (ELIQUIS) 5 MG TABS tablet TAKE 1 TABLET BY MOUTH TWICE A DAY 08/23/22  Yes End, [name], MD  Cholecalciferol (VITAMIN D3) 25 MCG (1000 UT) CAPS Take 1 capsule (1,000 Units total) by mouth daily. 05/24/20  Yes [name], MD  Cyanocobalamin (B-12) 1000 MCG CAPS Take by mouth daily at 2 am.   Yes [provider]  ENTRESTO 24-26 MG TAKE 1 TABLET BY MOUTH TWICE A DAY 09/25/22  Yes End, [name], MD  fenofibrate (TRICOR) 145 MG tablet TAKE 1 TABLET BY MOUTH EVERY DAY 10/05/22  Yes End, [name], MD  furosemide (LASIX) 40 MG tablet Take 1 tablet (40 mg total) by mouth daily as needed. Patient not taking: Reported on 12/28/2022 12/22/22   Tillman Abide I, MD  metoprolol succinate (TOPROL-XL) 50 MG 24 hr tablet TAKE 1 TABLET BY MOUTH DAILY. TAKE WITH OR IMMEDIATELY FOLLOWING A MEAL. 09/25/22  Yes End, [name], MD  Multiple Vitamins-Minerals (MACULAR VITAMIN BENEFIT PO) Take 1 tablet by mouth daily.   Yes [provider]  nitroGLYCERIN (NITROSTAT) 0.4 MG SL tablet Place 1 tablet (0.4 mg total) under the tongue every 5 (five) minutes as needed for chest pain. 03/26/20  Yes End, [name], MD  Omega-3 Fatty Acids (FISH OIL) 1200 MG CAPS Take 2 capsules (2,400 mg total) by mouth daily. 05/24/20  Yes [name], MD  omeprazole (PRILOSEC) 40 MG capsule TAKE 1 CAPSULE BY MOUTH EVERY MONDAY,WEDNESDAY, AND  FRIDAY 07/24/22  Yes [name], MD  rosuvastatin (CRESTOR) 10 MG tablet TAKE 1 TABLET BY MOUTH EVERY DAY 06/15/22  Yes End, [name], MD  tamsulosin (FLOMAX) 0.4 MG CAPS capsule TAKE 1 CAPSULE BY MOUTH EVERY DAY 09/18/22  Yes [name], MD  ACCU-CHEK AVIVA PLUS test strip USE AS DIRECTED TO CHECK BLOOD SUGARS UPTO FOUR TIMES DAILY. Patient not taking: Reported on 12/28/2022 03/16/20   [name], MD  blood glucose meter kit and supplies Dispense based on patient and insurance preference. Use up to four times daily as directed. (FOR ICD-10 E10.9, E11.9). Patient not taking: Reported on 12/28/2022 02/01/18   Angiulli, Mcarthur Rossetti, PA-C  FARXIGA 5 MG TABS tablet Take 5 mg by mouth daily. Patient not taking: Reported on 12/28/2022 10/27/22   [provider]  metFORMIN (GLUCOPHAGE) 500 MG tablet TAKE 1 TABLET BY MOUTH EVERY DAY WITH BREAKFAST Patient not taking: Reported on 12/28/2022 09/18/22   [name], MD     Vital Signs: BP (!) 111/59 (BP Location: Left Arm)   Pulse 94   Temp 97.6 F (36.4 C) (Oral)   Resp 18   SpO2 97%   Physical Exam Constitutional:      General: He is not in acute distress.    Appearance: He is not ill-appearing.     Comments: Sleepy but awakens to voice and able to answer questions appropriately  Pulmonary:     Effort: Pulmonary effort is normal.  Abdominal:     Palpations: Abdomen is soft.  Tenderness: There is no abdominal tenderness.     Comments: RUQ drain to suction. 50 ml of bloody fluid in bulb. Drain clean, dry and intact. Drain easily flushed with 5 ml NS.   Skin:    General: Skin is warm and dry.  Neurological:     Mental Status: He is oriented to person, place, and time.     Imaging: IR THORACENTESIS ASP PLEURAL SPACE W/IMG GUIDE  Result Date: 12/29/2022 INDICATION: 82 year old with large perihepatic abscess and right pleural effusion. EXAM: 1. Ultrasound-guided placement of drainage catheter in the perihepatic  abscess 2. Ultrasound-guided right thoracentesis MEDICATIONS: Moderate sedation ANESTHESIA/SEDATION: Moderate (conscious) sedation was employed during this procedure. A total of Versed 2mg  and fentanyl 50 mcg was administered intravenously at the order of the provider performing the procedure. Total intra-service moderate sedation time: 47 minutes. Patient's level of consciousness and vital signs were monitored continuously by radiology nurse throughout the procedure under the supervision of the provider performing the procedure. COMPLICATIONS: None immediate. PROCEDURE: Informed written consent was obtained from the patient after a thorough discussion of the procedural risks, benefits and alternatives. All questions were addressed. Maximal Sterile Barrier Technique was utilized including caps, mask, sterile gowns, sterile gloves, sterile drape, hand hygiene and skin antiseptic. A timeout was performed prior to the initiation of the procedure. Right upper abdomen was evaluated with ultrasound. The large perihepatic abscess was identified. The right upper abdomen was prepped with chlorhexidine and sterile field was created. Skin was anesthetized using 1% lidocaine. Small incision was made. Using ultrasound guidance, Yueh catheter was directed into the large perihepatic abscess. Yellow purulent fluid was aspirated. Superstiff Amplatz wire was placed. The tract was dilated to accommodate a 12 [place] multipurpose drain. 550 mL of purulent fluid was removed. Drain was sutured to the skin and attached to a suction bulb. Fluid was sent for culture. The right posterior chest was evaluated with ultrasound. Right pleural fluid was identified with ultrasound. A new sterile tray was opened. Right posterior chest was prepped with chlorhexidine and a sterile field was created. Skin was anesthetized using 1% lidocaine. Small incision was made. Using ultrasound guidance, a Safe-T-Centesis catheter was directed into the right  pleural space. Yellow fluid was aspirated. 900 mL of fluid was removed. Catheter was removed and a bandage was placed. FINDINGS: 550 mL of purulent fluid was removed from the perihepatic collection. Perihepatic abscess appeared to be decompressed at the end of the procedure. 900 mL of yellow fluid was removed from the right pleural space. IMPRESSION: 1. Ultrasound-guided placement of a drainage catheter in the large perihepatic abscess. 2. Ultrasound-guided right thoracentesis. Electronically Signed   By: Richarda Overlie M.D.   On: 12/29/2022 20:00   IR IMAGE GUIDED DRAINAGE BY PERCUTANEOUS CATHETER  Result Date: 12/29/2022 INDICATION: 82 year old with large perihepatic abscess and right pleural effusion. EXAM: 1. Ultrasound-guided placement of drainage catheter in the perihepatic abscess 2. Ultrasound-guided right thoracentesis MEDICATIONS: Moderate sedation ANESTHESIA/SEDATION: Moderate (conscious) sedation was employed during this procedure. A total of Versed 2mg  and fentanyl 50 mcg was administered intravenously at the order of the provider performing the procedure. Total intra-service moderate sedation time: 47 minutes. Patient's level of consciousness and vital signs were monitored continuously by radiology nurse throughout the procedure under the supervision of the provider performing the procedure. COMPLICATIONS: None immediate. PROCEDURE: Informed written consent was obtained from the patient after a thorough discussion of the procedural risks, benefits and alternatives. All questions were addressed. Maximal Sterile Barrier Technique was utilized including  caps, mask, sterile gowns, sterile gloves, sterile drape, hand hygiene and skin antiseptic. A timeout was performed prior to the initiation of the procedure. Right upper abdomen was evaluated with ultrasound. The large perihepatic abscess was identified. The right upper abdomen was prepped with chlorhexidine and sterile field was created. Skin was  anesthetized using 1% lidocaine. Small incision was made. Using ultrasound guidance, Yueh catheter was directed into the large perihepatic abscess. Yellow purulent fluid was aspirated. Superstiff Amplatz wire was placed. The tract was dilated to accommodate a 12 [place] multipurpose drain. 550 mL of purulent fluid was removed. Drain was sutured to the skin and attached to a suction bulb. Fluid was sent for culture. The right posterior chest was evaluated with ultrasound. Right pleural fluid was identified with ultrasound. A new sterile tray was opened. Right posterior chest was prepped with chlorhexidine and a sterile field was created. Skin was anesthetized using 1% lidocaine. Small incision was made. Using ultrasound guidance, a Safe-T-Centesis catheter was directed into the right pleural space. Yellow fluid was aspirated. 900 mL of fluid was removed. Catheter was removed and a bandage was placed. FINDINGS: 550 mL of purulent fluid was removed from the perihepatic collection. Perihepatic abscess appeared to be decompressed at the end of the procedure. 900 mL of yellow fluid was removed from the right pleural space. IMPRESSION: 1. Ultrasound-guided placement of a drainage catheter in the large perihepatic abscess. 2. Ultrasound-guided right thoracentesis. Electronically Signed   By: Richarda Overlie M.D.   On: 12/29/2022 20:00   DG Chest Port 1 View  Result Date: 12/29/2022 CLINICAL DATA:  Post thoracentesis. EXAM: PORTABLE CHEST 1 VIEW COMPARISON:  12/28/2022 FINDINGS: Sternotomy wires unchanged. Lungs are adequately inflated with interval improvement and previously seen right pleural effusion as no residual pleural fluid is identified. No evidence of right-sided pneumothorax. Left lung is clear. Cardiomediastinal silhouette and remainder of the exam is unchanged. IMPRESSION: Interval improvement in previously seen right pleural effusion post thoracentesis. No pneumothorax. Electronically Signed   By: Elberta Fortis  M.D.   On: 12/29/2022 16:02   CT Angio Chest PE W and/or Wo Contrast  Result Date: 12/28/2022 CLINICAL DATA:  Headache, short of breath, weakness, history of endocarditis, abdominal pain, nonlocalized right upper quadrant pain EXAM: CT ANGIOGRAPHY CHEST CT ABDOMEN AND PELVIS WITH CONTRAST TECHNIQUE: Multidetector CT imaging of the chest was performed using the standard protocol during bolus administration of intravenous contrast. Multiplanar CT image reconstructions and MIPs were obtained to evaluate the vascular anatomy. Multidetector CT imaging of the abdomen and pelvis was performed using the standard protocol during bolus administration of intravenous contrast. RADIATION DOSE REDUCTION: This exam was performed according to the departmental dose-optimization program which includes automated exposure control, adjustment of the mA and/or kV according to patient size and/or use of iterative reconstruction technique. CONTRAST:  OMNIPAQUE IOHEXOL 350 MG/ML SOLN COMPARISON:  05/10/2022, 10/23/2021 FINDINGS: CTA CHEST FINDINGS Cardiovascular: This is a technically adequate evaluation of the pulmonary vasculature. No filling defects or pulmonary emboli. The heart is enlarged, with no evidence of pericardial effusion. There is calcification of the aortic and mitral valves. Normal caliber of the thoracic aorta. No evidence of aneurysm or dissection. Postsurgical changes from prior CABG. Atherosclerosis of the aorta and native coronary vessels. Mediastinum/Nodes: No enlarged mediastinal, hilar, or axillary lymph nodes. Thyroid gland, trachea, and esophagus demonstrate no significant findings. Lungs/Pleura: There is a large right pleural effusion, volume estimated in excess of 1 L. minimal compressive atelectasis of the right lower lobe.  No airspace disease or pneumothorax. Minimal mucoid material within the trachea. Otherwise the central airways are patent. Musculoskeletal: No acute or destructive bony lesions.  Reconstructed images demonstrate no additional findings. Review of the MIP images confirms the above findings. CT ABDOMEN and PELVIS FINDINGS Hepatobiliary: Since the prior exam, the complex fluid collection along the ventral aspect of the liver has enlarged, measuring 15.8 x 8.6 by 9.8 cm on today's exam. Continued mural thickening with internal septations concerning for subcapsular or Peri hepatic abscess. This is amenable to percutaneous sampling if not previously performed. The remainder of the liver is unremarkable. Gallbladder is surgically absent. No biliary duct dilation. Pancreas: Unremarkable. No pancreatic ductal dilatation or surrounding inflammatory changes. Spleen: Normal in size without focal abnormality. Adrenals/Urinary Tract: Stable bilateral renal cysts do not require specific imaging follow-up. Stable nonobstructing right renal calculi, measuring up to 5 mm. No obstructive uropathy within either kidney. The adrenals and bladder are unremarkable. Stomach/Bowel: No bowel obstruction or ileus. Diffuse colonic diverticulosis without evidence of diverticulitis. No bowel wall thickening. Vascular/Lymphatic: Aortic atherosclerosis. No enlarged abdominal or pelvic lymph nodes. Reproductive: Mild enlargement of the prostate unchanged. Other: No free fluid or free intraperitoneal gas. Evidence of prior umbilical hernia repair. Stable fat containing bilateral inguinal hernias. No bowel herniation. Musculoskeletal: No acute or destructive bony lesions. Reconstructed images demonstrate no additional findings. Review of the MIP images confirms the above findings. IMPRESSION: Chest: 1. No evidence of pulmonary embolus. 2. Large right pleural effusion, volume estimated in excess of 1 L. 3. Cardiomegaly. 4. Aortic and mitral valve calcifications. 5.  Aortic Atherosclerosis (ICD10-I70.0). Abdomen/pelvis: 1. Enlarging 15.8 x 8.6 x 9.8 cm complex fluid collection along the ventral margin of the liver, increased  since prior exam and consistent with enlarging subcapsular or perihepatic abscess. This is amenable to percutaneous aspiration/drainage. 2. Nonobstructing right renal calculi. 3. Diffuse colonic diverticulosis without diverticulitis. 4.  Aortic Atherosclerosis (ICD10-I70.0). Electronically Signed   By: Sharlet Salina M.D.   On: 12/28/2022 14:57   CT ABDOMEN PELVIS W CONTRAST  Result Date: 12/28/2022 CLINICAL DATA:  Headache, short of breath, weakness, history of endocarditis, abdominal pain, nonlocalized right upper quadrant pain EXAM: CT ANGIOGRAPHY CHEST CT ABDOMEN AND PELVIS WITH CONTRAST TECHNIQUE: Multidetector CT imaging of the chest was performed using the standard protocol during bolus administration of intravenous contrast. Multiplanar CT image reconstructions and MIPs were obtained to evaluate the vascular anatomy. Multidetector CT imaging of the abdomen and pelvis was performed using the standard protocol during bolus administration of intravenous contrast. RADIATION DOSE REDUCTION: This exam was performed according to the departmental dose-optimization program which includes automated exposure control, adjustment of the mA and/or kV according to patient size and/or use of iterative reconstruction technique. CONTRAST:  OMNIPAQUE IOHEXOL 350 MG/ML SOLN COMPARISON:  05/10/2022, 10/23/2021 FINDINGS: CTA CHEST FINDINGS Cardiovascular: This is a technically adequate evaluation of the pulmonary vasculature. No filling defects or pulmonary emboli. The heart is enlarged, with no evidence of pericardial effusion. There is calcification of the aortic and mitral valves. Normal caliber of the thoracic aorta. No evidence of aneurysm or dissection. Postsurgical changes from prior CABG. Atherosclerosis of the aorta and native coronary vessels. Mediastinum/Nodes: No enlarged mediastinal, hilar, or axillary lymph nodes. Thyroid gland, trachea, and esophagus demonstrate no significant findings. Lungs/Pleura:  There is a large right pleural effusion, volume estimated in excess of 1 L. minimal compressive atelectasis of the right lower lobe. No airspace disease or pneumothorax. Minimal mucoid material within the trachea. Otherwise the central  airways are patent. Musculoskeletal: No acute or destructive bony lesions. Reconstructed images demonstrate no additional findings. Review of the MIP images confirms the above findings. CT ABDOMEN and PELVIS FINDINGS Hepatobiliary: Since the prior exam, the complex fluid collection along the ventral aspect of the liver has enlarged, measuring 15.8 x 8.6 by 9.8 cm on today's exam. Continued mural thickening with internal septations concerning for subcapsular or Peri hepatic abscess. This is amenable to percutaneous sampling if not previously performed. The remainder of the liver is unremarkable. Gallbladder is surgically absent. No biliary duct dilation. Pancreas: Unremarkable. No pancreatic ductal dilatation or surrounding inflammatory changes. Spleen: Normal in size without focal abnormality. Adrenals/Urinary Tract: Stable bilateral renal cysts do not require specific imaging follow-up. Stable nonobstructing right renal calculi, measuring up to 5 mm. No obstructive uropathy within either kidney. The adrenals and bladder are unremarkable. Stomach/Bowel: No bowel obstruction or ileus. Diffuse colonic diverticulosis without evidence of diverticulitis. No bowel wall thickening. Vascular/Lymphatic: Aortic atherosclerosis. No enlarged abdominal or pelvic lymph nodes. Reproductive: Mild enlargement of the prostate unchanged. Other: No free fluid or free intraperitoneal gas. Evidence of prior umbilical hernia repair. Stable fat containing bilateral inguinal hernias. No bowel herniation. Musculoskeletal: No acute or destructive bony lesions. Reconstructed images demonstrate no additional findings. Review of the MIP images confirms the above findings. IMPRESSION: Chest: 1. No evidence of  pulmonary embolus. 2. Large right pleural effusion, volume estimated in excess of 1 L. 3. Cardiomegaly. 4. Aortic and mitral valve calcifications. 5.  Aortic Atherosclerosis (ICD10-I70.0). Abdomen/pelvis: 1. Enlarging 15.8 x 8.6 x 9.8 cm complex fluid collection along the ventral margin of the liver, increased since prior exam and consistent with enlarging subcapsular or perihepatic abscess. This is amenable to percutaneous aspiration/drainage. 2. Nonobstructing right renal calculi. 3. Diffuse colonic diverticulosis without diverticulitis. 4.  Aortic Atherosclerosis (ICD10-I70.0). Electronically Signed   By: Sharlet Salina M.D.   On: 12/28/2022 14:57   CT HEAD WO CONTRAST ( )  Result Date: 12/28/2022 CLINICAL DATA:  Headache, shortness of breath, weakness, on Eliquis EXAM: CT HEAD WITHOUT CONTRAST TECHNIQUE: Contiguous axial images were obtained from the base of the skull through the vertex without intravenous contrast. RADIATION DOSE REDUCTION: This exam was performed according to the departmental dose-optimization program which includes automated exposure control, adjustment of the mA and/or kV according to patient size and/or use of iterative reconstruction technique. COMPARISON:  10/23/2021 FINDINGS: Brain: No evidence of acute infarction, hemorrhage, mass, mass effect, or midline shift. No hydrocephalus or extra-axial fluid collection. Periventricular white matter changes, likely the sequela of chronic small vessel ischemic disease. Cerebral volume is within normal limits for age. Vascular: No hyperdense vessel. Atherosclerotic calcifications in the intracranial carotid and vertebral arteries. Skull: Negative for fracture or focal lesion. Sinuses/Orbits: No acute finding. Status post bilateral lens replacements. Other: The mastoid air cells are well aerated. IMPRESSION: No acute intracranial process. Electronically Signed   By: Wiliam Ke M.D.   On: 12/28/2022 14:46   DG Chest 2 View  Result Date:  12/28/2022 CLINICAL DATA:  SOB EXAM: CHEST - 2 VIEW COMPARISON:  12/26/2022. FINDINGS: Enlarged cardiac silhouette. Small right-sided pleural effusion. No pneumothorax. Calcified aorta. Status post median sternotomy and CABG. IMPRESSION: Enlarged cardiac silhouette.  Small right-sided pleural effusion. Electronically Signed   By: Layla Maw M.D.   On: 12/28/2022 12:36   DG Chest 2 View  Result Date: 12/26/2022 CLINICAL DATA:  Dyspnea. Pleural effusion post thoracentesis last week. EXAM: CHEST - 2 VIEW COMPARISON:  Radiographs 12/22/2022 and 12/21/2022.  CT 10/23/2021. FINDINGS: The heart size and mediastinal contours are stable status post median sternotomy and CABG. Small residual right pleural effusion without significant recurrence. There is no pneumothorax, edema or confluent airspace opacity. The bones appear unchanged. IMPRESSION: Small residual right pleural effusion without significant recurrence. No acute cardiopulmonary process. Electronically Signed   By: Carey Bullocks M.D.   On: 12/26/2022 13:09    Labs:  CBC: Recent Labs    12/26/22 1405 12/28/22 1135 12/29/22 0329 12/30/22 0418  WBC 10.2 10.6* 9.8 13.9*  HGB 12.0* 12.1* 10.1* 10.6*  HCT 36.6* 38.6* 32.2* 33.8*  PLT 544.0* 627* 471* 514*    COAGS: Recent Labs    12/29/22 0329  INR 1.6*    BMP: Recent Labs    04/25/22 1113 05/17/22 0759 12/27/22 0807 12/28/22 1135 12/29/22 0329 12/30/22 0418  NA 140   < > 137 136 138 140  K 4.2   < > 5.1 4.4 4.2 4.1  CL 110   < > 101 103 108 110  CO2 23   < > 19*  GLUCOSE 122*   < > 117* 136* 115* 108*  BUN 29*   < > 58* 41* 33* 32*  CALCIUM 9.1   < > 8.8 8.8* 8.3* 8.3*  CREATININE 1.44*   < > 1.34 1.17 1.07 1.07  GFRNONAA 49*  --   --  >60 >60 >60   < > = values in this interval not displayed.    LIVER FUNCTION TESTS: Recent Labs    06/20/22 0729 12/21/22 1455 12/27/22 0807 12/28/22 1135 12/28/22 1233  BILITOT 0.5 1.1  --  0.6 0.4  AST 22 8  --   32 26  ALT 13 4  --  15 11  ALKPHOS 64 47  --  65 74  PROT 6.8 6.5  --  6.8 6.1  ALBUMIN 3.2* 3.3* 2.9* 2.5* 2.9*    Assessment and Plan:   Peri-hepatic abscess s/p drain placement in IR 12/29/22 by Dr. Lowella Dandy.   Drain Location: RUQ Size: Fr size: 12 Fr Date of placement: 12/29/22 Currently to: Drain collection device: suction bulb 24 hour output:  Output by Drain (mL) 12/28/22 0700 - 12/28/22 1459 12/28/22 1500 - 12/28/22 2259 12/28/22 2300 - 12/29/22 0659 12/29/22 0700 - 12/29/22 1459 12/29/22 1500 - 12/29/22 2259 12/29/22 2300 - 12/30/22 0659 12/30/22 0700 - 12/30/22 0904  Closed System Drain RUQ Bulb (JP) 12 Fr.    550 48 93 50    Interval imaging/drain manipulation:  none  Current examination: Flushes/aspirates easily.  Insertion site unremarkable. Suture and stat lock in place. Dressed appropriately.   Plan: Continue TID flushes with 5 cc NS. Record output Q shift. Dressing changes QD or PRN if soiled.  Call IR APP or on call IR MD if difficulty flushing or sudden change in drain output.  Repeat imaging/possible drain injection once output < 10 mL/QD (excluding flush material). Consideration for drain removal if output is < 10 mL/QD (excluding flush material), pending discussion with the providing surgical service.  Discharge planning: Please contact IR APP or on call IR MD prior to patient d/c to ensure appropriate follow up plans are in place. Typically patient will follow up with IR clinic 10-14 days post d/c for repeat imaging/possible drain injection. IR scheduler will contact patient with date/time of appointment. Patient will need to flush drain QD with 5 cc NS, record output QD, dressing changes every 2-3 days or earlier if soiled.  IR will continue to follow - please call with questions or concerns.    Electronically Signed: Alwyn Ren, AGACNP-BC [phone] 12/30/2022, 1:38 PM   I spent a total of 15 Minutes at the the patient's bedside AND on the  patient's hospital floor or unit, greater than 50% of which was counseling/coordinating care for peri-hepatic abscess drain.

## 2022-12-31 DIAGNOSIS — I5022 Chronic systolic (congestive) heart failure: Secondary | ICD-10-CM | POA: Diagnosis not present

## 2022-12-31 DIAGNOSIS — K750 Abscess of liver: Secondary | ICD-10-CM | POA: Diagnosis not present

## 2022-12-31 DIAGNOSIS — J90 Pleural effusion, not elsewhere classified: Secondary | ICD-10-CM | POA: Diagnosis not present

## 2022-12-31 LAB — BASIC METABOLIC PANEL
Anion gap: 9 (ref 5–15)
BUN: 32 mg/dL — ABNORMAL HIGH (ref 8–23)
CO2: 21 mmol/L — ABNORMAL LOW (ref 22–32)
Calcium: 8.3 mg/dL — ABNORMAL LOW (ref 8.9–10.3)
Chloride: 109 mmol/L (ref 98–111)
Creatinine, Ser: 1.08 mg/dL (ref 0.61–1.24)
GFR, Estimated: >60 mL/min (ref >60)
Glucose, Bld: 117 mg/dL — ABNORMAL HIGH (ref 70–99)
Potassium: 3.9 mmol/L (ref 3.5–5.1)
Sodium: 139 mmol/L (ref 135–145)

## 2022-12-31 LAB — AEROBIC/ANAEROBIC CULTURE W GRAM STAIN (SURGICAL/DEEP WOUND)

## 2022-12-31 LAB — CBC
HCT: 32.7 % — ABNORMAL LOW (ref 39.0–52.0)
Hemoglobin: 10.2 g/dL — ABNORMAL LOW (ref 13.0–17.0)
MCH: 27 pg (ref 26.0–34.0)
MCHC: 31.2 g/dL (ref 30.0–36.0)
MCV: 86.5 fL (ref 80.0–100.0)
Platelets: 515 10*3/uL — ABNORMAL HIGH (ref 150–400)
RBC: 3.78 MIL/uL — ABNORMAL LOW (ref 4.22–5.81)
RDW: 15.9 % — ABNORMAL HIGH (ref 11.5–15.5)
WBC: 12.9 10*3/uL — ABNORMAL HIGH (ref 4.0–10.5)
nRBC: 0 % (ref 0.0–0.2)

## 2022-12-31 LAB — MAGNESIUM: Magnesium: 2 mg/dL (ref 1.7–2.4)

## 2022-12-31 LAB — PHOSPHORUS: Phosphorus: 2.8 mg/dL (ref 2.5–4.6)

## 2022-12-31 NOTE — Progress Notes (Signed)
Physical Therapy Treatment Patient Details Name: [name] Young. MRN: [id number] DOB: 1941/08/31 Today's Date: 12/31/2022   History of Present Illness Mr. Hinchman is a 82 y/o male with PMH including CABGx3, a-fib, and shortness of breath. Presented to ER secondary to SOB, progressive weakness; admitted for management of hepatic abscess (s/p perihepatic drain) and large R pleural effusion (s/p R thoracentesis)    PT Comments    Available on 3rd attempt with encouragement from daughter to participate.  He is able to get to EOB with increased time and opts to pull up on daughters hand to get fully to EOB.  Generally steady in sitting and uses pillow to brace his stomach when coughing.  He does agree to stand but declined use of RW and opts for HHA +2 with daughter assisting.  He is able to take about 3 small sidesteps along bed to reposition but sits prior to getting fully to [facility]  Declined further activity.  He is able to get his legs mostly in bed by himself but does need some min assist to complete.  Pt does not like his feet/toes touched so care is given not to touch them.  Repositioned with max a +2.      Recommendations for follow up therapy are one component of a multi-disciplinary discharge planning process, led by the attending physician.  Recommendations may be updated based on patient status, additional functional criteria and insurance authorization.  Follow Up Recommendations       Assistance Recommended at Discharge Frequent or constant Supervision/Assistance  Patient can return home with the following A lot of help with walking and/or transfers;A lot of help with bathing/dressing/bathroom   Equipment Recommendations       Recommendations for Other Services       Precautions / Restrictions Precautions Precautions: Fall Precaution Comments: R abdominal/perihepatic JP drain Restrictions Weight Bearing Restrictions: No     Mobility  Bed Mobility Overal bed  mobility: Needs Assistance Bed Mobility: Supine to Sit, Sit to Supine     Supine to sit: Mod assist Sit to supine: Min assist   General bed mobility comments: daughter assists to EOB by him pulling up on her hand.  min assist to finish getting his legs up on the bed.    Transfers Overall transfer level: Needs assistance Equipment used: 2 person hand held assist Transfers: Sit to/from Stand Sit to Stand: Min assist, From elevated surface, +2 physical assistance           General transfer comment: a few sidesteps along EOB    Ambulation/Gait Ambulation/Gait assistance: Min assist, +2 physical assistance Gait Distance (Feet): 3 Feet Assistive device: 2 person hand held assist Gait Pattern/deviations: Step-to pattern Gait velocity: decreased     General Gait Details: sidesteps along EOB limited by fatigue   Stairs             Wheelchair Mobility    Modified Rankin (Stroke Patients Only)       Balance Overall balance assessment: Needs assistance Sitting-balance support: No upper extremity supported, Feet supported Sitting balance-Leahy Scale: Good     Standing balance support: Bilateral upper extremity supported Standing balance-Leahy Scale: Fair                              Cognition Arousal/Alertness: Awake/alert Behavior During Therapy: WFL for tasks assessed/performed Overall Cognitive Status: Within Functional Limits for tasks assessed  General Comments: very talkative during session.        Exercises      General Comments        Pertinent Vitals/Pain Pain Assessment Pain Assessment: Faces Faces Pain Scale: Hurts even more Pain Location: abdomen when he coughs or moves - braces with pillow Pain Descriptors / Indicators: Aching, Grimacing Pain Intervention(s): Limited activity within patient's tolerance, Monitored during session, Repositioned    Home Living                           Prior Function            PT Goals (current goals can now be found in the care plan section) Progress towards PT goals: Progressing toward goals    Frequency    Min 4X/week      PT Plan Current plan remains appropriate    Co-evaluation              AM-PAC PT "6 Clicks" Mobility   Outcome Measure  Help needed turning from your back to your side while in a flat bed without using bedrails?: A Little Help needed moving from lying on your back to sitting on the side of a flat bed without using bedrails?: A Lot Help needed moving to and from a bed to a chair (including a wheelchair)?: A Little Help needed standing up from a chair using your arms (e.g., wheelchair or bedside chair)?: A Little Help needed to walk in hospital room?: A Lot Help needed climbing 3-5 steps with a railing? : Total 6 Click Score: 14    End of Session   Activity Tolerance: Patient tolerated treatment well;Patient limited by pain Patient left: in bed;with call bell/phone within reach;with bed alarm set;with family/visitor present Nurse Communication: Mobility status PT Visit Diagnosis: Muscle weakness (generalized) (M62.81);Difficulty in walking, not elsewhere classified (R26.2)     Time: 4098-1191 PT Time Calculation (min) (ACUTE ONLY): 10 min  Charges:  $Therapeutic Activity: 8-22 mins                   Danielle Dess, PTA 12/31/22, 2:49 PM

## 2022-12-31 NOTE — Progress Notes (Signed)
Progress Note   Patient: [name] Young. VWU:[id number] DOB: 07/31/1941 DOA: 12/28/2022     3 DOS: the patient was seen and examined on 12/31/2022   Brief hospital course: Mr. Daemien, Fronczak. 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 Showed large right pleural effusion, large liver abscess of 15.8 x8.6 x9.8 cm. Patient is placed on broad-spectrum antibiotics changed to Zosyn.  IR drain performed on 4/19.  Gram stain showed positive for gram-negative rods.    Principal Problem:   Hepatic abscess Active Problems:   Essential hypertension   GERD (gastroesophageal reflux disease)   Benign prostatic hyperplasia   DNR (do not resuscitate)   S/P CABG x 3   FTT (failure to thrive) in adult   Paroxysmal atrial fibrillation   Chronic HFrEF (heart failure with reduced ejection fraction)   Shortness of breath   History of endocarditis   History of bacteremia   Protein-calorie malnutrition, severe   Reactive thrombocytosis   Pleural effusion, right   Metabolic acidosis   Assessment and Plan: Hepatic abscess Right large pleural effusion Patient did not meet sepsis criteria at time of admission, patient has a very large liver abscess.  Antibiotic changed to Zosyn, CT scan also showed large right pleural effusion which is recurrent.  Blood cultures so far no growth.  Hepatic abscess probably due to bacteremia from last year. Will discuss with ID once culture results available. Patient had IR drain of abscess and a right-sided thoracentesis.  Removed  550 of purulent fluid from the liver, 900 mm of yellow pleural fluid. Pleural fluid consistent with exudates, likely reactive to abscess of the liver. Gram stain from abscess positive for gram-negative rods, will continue current antibiotic with Zosyn until the final culture results available. Patient short of breath much improved today after procedure. Liver abscess drain still in place, culture still pending for ID and susceptibility.  Continue Zosyn.  Protein-calorie malnutrition, severe Obesity with BMI 30.7. FTT (failure to thrive) in adult Patient has been having poor appetite for the last few months, he has significant weight loss.  Will start protein supplement with nutrition consult.   History of bacteremia and bacterial endocarditis. History of E. coli and Streptococcus bacteremia, repeat blood cultures negative. Echocardiogram showed ejection fraction 50 to 55% with grade 1 diastolic dysfunction.  Calcified mitral valve and aortic valve with mild mitral regurgitation and moderate aortic stenosis. Continue to follow.   Paroxysmal atrial fibrillation Restart Eliquis.   Essential hypertension Chronic diastolic congestive heart failure. Previous echocardiogram showed ejection fraction 50 to 55%, repeat echocardiogram pending.  Currently patient does not have volume overload. No evidence of volume overload this time.     Subjective:  Patient feels short of breath better, still has some abdominal pain from the hepatic drain.  Had a large bowel movement after giving lactulose yesterday.  Physical Exam: Vitals:   12/30/22 1943 12/31/22 0330 12/31/22 0431 12/31/22 1138  BP: 121/73 120/62  (!) 109/58  Pulse: 83 87  (!) 49  Resp: Temp: 97.8 F (36.6 C) 97.7 F (36.5 C)  98.4 F (36.9 C)  TempSrc: Oral  SpO2: 100% 98%    Weight:   97 kg    General exam: Appears calm and comfortable  Respiratory system: Clear to auscultation. Respiratory effort  normal. Cardiovascular system: S1 & S2 heard, RRR. No JVD, murmurs, rubs, gallops or clicks. No pedal edema. Gastrointestinal system: Abdomen is nondistended, soft and nontender. No organomegaly or masses felt. Normal bowel sounds heard. Central nervous system: Alert and oriented x3. No focal neurological deficits. Extremities: Symmetric 5 x 5 power. Skin: No rashes, lesions or ulcers Psychiatry: Judgement and insight appear normal. Mood & affect appropriate.    Data Reviewed:  Lab results reviewed.  Family Communication: Wife updated at bedside.  Disposition: Status is: Inpatient Remains inpatient appropriate because: Severity of disease, IV treatment,     Time spent: 35 minutes  Author: Marrion Coy, MD 12/31/2022 12:18 PM  For on call review [email or web address].

## 2023-01-01 ENCOUNTER — Inpatient Hospital Stay: Payer: Medicare PPO

## 2023-01-01 ENCOUNTER — Other Ambulatory Visit: Payer: Self-pay | Admitting: Family Medicine

## 2023-01-01 DIAGNOSIS — I480 Paroxysmal atrial fibrillation: Secondary | ICD-10-CM | POA: Diagnosis not present

## 2023-01-01 DIAGNOSIS — K750 Abscess of liver: Secondary | ICD-10-CM | POA: Diagnosis not present

## 2023-01-01 DIAGNOSIS — Z8679 Personal history of other diseases of the circulatory system: Secondary | ICD-10-CM

## 2023-01-01 DIAGNOSIS — Z87898 Personal history of other specified conditions: Secondary | ICD-10-CM

## 2023-01-01 DIAGNOSIS — J90 Pleural effusion, not elsewhere classified: Secondary | ICD-10-CM | POA: Diagnosis not present

## 2023-01-01 LAB — CULTURE, BLOOD (ROUTINE X 2)
Culture: NO GROWTH
Special Requests: ADEQUATE

## 2023-01-01 LAB — PATHOLOGIST SMEAR REVIEW

## 2023-01-01 MED ORDER — CEFAZOLIN SODIUM-DEXTROSE 2-4 GM/100ML-% IV SOLN
2.0000 g | Freq: Three times a day (TID) | INTRAVENOUS | Status: DC
  Administered 2023-01-01 – 2023-01-02 (×3): 2 g via INTRAVENOUS
  Filled 2023-01-01 (×3): qty 100

## 2023-01-01 MED ORDER — ROSUVASTATIN CALCIUM 10 MG PO TABS
10.0000 mg | ORAL_TABLET | Freq: Every day | ORAL | Status: DC
  Administered 2023-01-02 – 2023-01-05 (×4): 10 mg via ORAL
  Filled 2023-01-01 (×4): qty 1

## 2023-01-01 NOTE — Progress Notes (Signed)
Nutrition Follow-up  DOCUMENTATION CODES:   Obesity unspecified, Severe malnutrition in context of acute illness/injury  INTERVENTION:   -Continue Ensure Enlive po TID, each supplement provides 350 kcal and 20 grams of protein -Continue MVI with minerals daily -Magic cup TID with meals, each supplement provides 290 kcal and 9 grams of protein  -Continue with regular diet -Encourage adequate oral intake; family to bring in outside food if desired  NUTRITION DIAGNOSIS:   Severe Malnutrition related to acute illness (hepatic abscess) as evidenced by moderate fat depletion, severe fat depletion, moderate muscle depletion, severe muscle depletion, energy intake < or equal to 75% for > or equal to 1 month.  Ongoing  GOAL:   Patient will meet greater than or equal to 90% of their needs  Progressing   MONITOR:   PO intake, Supplement acceptance  REASON FOR ASSESSMENT:   Consult Assessment of nutrition requirement/status  ASSESSMENT:   Pt with history of transient and then recurrent atrial fibrillation, diverticulosis, three-vessel CAD status post CABG in May 2019, history of bacteremia with mitral valve endocarditis in February 2023, mitral valve endocarditis, patient completed course of cefazolin for 6 weeks on 12/05/2021 via PICC line, hyperlipidemia, hypertension, cardiomyopathy, who presents for chief concerns of shortness of breath and weakness from cardiology clinic.  Reviewed I/O's: +172 ml x 24 hours and -645 ml since admission  UOP: 500 ml x 24 hours  Drain output: 80 ml x 24 hours   Spoke with pt and family at bedside. Pt reports feeling better but complains about issues of pain control, which prohibit him from working with therapy optimally. Pt reports poor oral intake, stating food often doesn't taste right. Noted meal completions 25-50%. Pt also complains of food being cold due to multiple interruptions. Wife shares that she often brings in outside food and pt  consumed most of his coffee and oatmeal from [employer].   Pt does not eat much from meal trays, but enjoys the Ensure supplements. PTA, pt reports that he has had taste aversions and has pretty given up meat all together due to this. Pt enjoys sweets and is willing to try Magic Cups. Per wife, pt tried these when he was at [facility], but was provided them inconsistently.   Discussed importance of good meal and supplement intake to promote healing. Pt amenable to continue supplements.   Reviewed wt hx; pt has experienced a 7.3% wt loss over the past 3 months. While this is not significant for time frame, it is concerning given multiple co-morbidities.   Medications reviewed and include vitamin D3 and vitamin B-12.   Labs reviewed: CBGS: 185.    NUTRITION - FOCUSED PHYSICAL EXAM:  Flowsheet Row Most Recent Value  Orbital Region Moderate depletion  Upper Arm Region Moderate depletion  Thoracic and Lumbar Region Mild depletion  Buccal Region Moderate depletion  Temple Region Moderate depletion  Clavicle Bone Region Severe depletion  Clavicle and Acromion Bone Region Severe depletion  Scapular Bone Region Severe depletion  Dorsal Hand Moderate depletion  Patellar Region Mild depletion  Anterior Thigh Region Mild depletion  Posterior Calf Region Mild depletion  Edema (RD Assessment) None  Hair Reviewed  Eyes Reviewed  Mouth Reviewed  Skin Reviewed  Nails Reviewed       Diet Order:   Diet Order             Diet regular Room service appropriate? Yes; Fluid consistency: Thin  Diet effective now  EDUCATION NEEDS:   Education needs have been addressed  Skin:  Skin Assessment: Reviewed RN Assessment  Last BM:  12/31/22 (type 6)  Height:   Ht Readings from Last 1 Encounters:  12/28/22 5' 10.5" (1.791 m)    Weight:   Wt Readings from Last 1 Encounters:  01/01/23 97.5 kg    Ideal Body Weight:  75.6 kg  BMI:  Body mass index is 30.41  kg/m.  Estimated Nutritional Needs:   Kcal:  2100-2300  Protein:  115-130 grams  Fluid:  > 2 L    Levada Schilling, RD, LDN, CDCES Registered Dietitian II Certified Diabetes Care and Education Specialist Please refer to [facility] for RD and/or RD on-call/weekend/after hours pager

## 2023-01-01 NOTE — Care Management Important Message (Signed)
Important Message  Patient Details  Name: [name] Young. MRN: [id number] Date of Birth: September 10, 1941   Medicare Important Message Given:  Yes     [name] 01/01/2023, 11:55 AM

## 2023-01-01 NOTE — Telephone Encounter (Signed)
Pt currently admitted to [facility].   Lvm asking pt/pt's wife, [name] Young (on dpr) to call back. Need update on pt's sxs.   [Also, lvm on 12/28/22-- see Labs, Result Notes- 12/26/22.]

## 2023-01-01 NOTE — Telephone Encounter (Signed)
Pt is currently admitted to [facility].   Omeprazole Last filled:  10/13/22, #38 Last OV:  12/26/22, cough Next OV:  none

## 2023-01-01 NOTE — Telephone Encounter (Addendum)
Called and spoke with patient and wife for update.

## 2023-01-01 NOTE — Progress Notes (Signed)
Physical Therapy Treatment Patient Details Name: [name] Young. MRN: [id number] DOB: [birth year] Today's Date: 01/01/2023   History of Present Illness Mr. Washabaugh is a 82 y/o male with PMH including CABGx3, a-fib, and shortness of breath. Presented to ER secondary to SOB, progressive weakness; admitted for management of hepatic abscess (s/p perihepatic drain) and large R pleural effusion (s/p R thoracentesis)    PT Comments    Pt resting in bed upon PT arrival; pt reports recently using [facility] but pt agreeable to therapy with encouragement from pt's wife.  Pt c/o 4-5/10 abdominal pain (nurse notified).  During session pt mod assist with bed mobility; min assist x2 to stand from bed with RW use; and CGA to sidestep to R along bed a few feet with RW use (limited d/t pt fatigue/generalized weakness/SOB).  Will continue to focus on strengthening, endurance, and progressive functional mobility per pt tolerance.    Recommendations for follow up therapy are one component of a multi-disciplinary discharge planning process, led by the attending physician.  Recommendations may be updated based on patient status, additional functional criteria and insurance authorization.  Follow Up Recommendations  Can patient physically be transported by private vehicle: No    Assistance Recommended at Discharge Frequent or constant Supervision/Assistance  Patient can return home with the following A lot of help with walking and/or transfers;A lot of help with bathing/dressing/bathroom;Assistance with cooking/housework;Assist for transportation;Help with stairs or ramp for entrance   Equipment Recommendations       Recommendations for Other Services       Precautions / Restrictions Precautions Precautions: Fall Precaution Comments: RUQ abdominal/perihepatic JP drain Restrictions Weight Bearing Restrictions: No     Mobility  Bed Mobility Overal bed mobility: Needs Assistance Bed Mobility: Supine to  Sit, Sit to Supine     Supine to sit: Mod assist (assist for trunk) Sit to supine: Mod assist (assist for B LE's)   General bed mobility comments: vc's for technique; increased effort to perform as much on own as possible    Transfers Overall transfer level: Needs assistance Equipment used: Rolling walker (2 wheels) Transfers: Sit to/from Stand Sit to Stand: Min assist, +2 physical assistance           General transfer comment: assist to initiate stand up to RW and control descent sitting; vc's for technique    Ambulation/Gait Ambulation/Gait assistance: Min guard Gait Distance (Feet):  (sidestepped to R along bed a few feet) Assistive device: Rolling walker (2 wheels)   Gait velocity: decreased     General Gait Details: increased effort to take small sidesteps to R along bed; limited d/t SOB and fatigue   Stairs             Wheelchair Mobility    Modified Rankin (Stroke Patients Only)       Balance Overall balance assessment: Needs assistance Sitting-balance support: No upper extremity supported, Feet supported Sitting balance-Leahy Scale: Good Sitting balance - Comments: steady sitting reaching within BOS   Standing balance support: Bilateral upper extremity supported, During functional activity, Reliant on assistive device for balance Standing balance-Leahy Scale: Fair Standing balance comment: steady static standing with B UE support on RW                            Cognition Arousal/Alertness: Awake/alert Behavior During Therapy: WFL for tasks assessed/performed Overall Cognitive Status: Within Functional Limits for tasks assessed  Exercises      General Comments General comments (skin integrity, edema, etc.): RUQ JP drain intact beginning/end of session (no drainage noted on dressing).  Nursing cleared pt for participation in physical therapy.  Pt agreeable to PT session  with wife's encouragement.  Pt's wife present during session.  2 assist to boost pt up in bed using bed sheet end of session.      Pertinent Vitals/Pain Pain Assessment Pain Assessment: 0-10 Pain Score: 5  Pain Location: abdomen Pain Descriptors / Indicators: Aching, Grimacing Pain Intervention(s): Limited activity within patient's tolerance, Monitored during session, Repositioned, Patient requesting pain meds-RN notified O2 sats 94% or greater on room air and HR 94-120 bpm during sessions activities.    Home Living                          Prior Function            PT Goals (current goals can now be found in the care plan section) Acute Rehab PT Goals Patient Stated Goal: to return home PT Goal Formulation: With patient/family Time For Goal Achievement: 01/13/23 Potential to Achieve Goals: Good Progress towards PT goals: Progressing toward goals    Frequency    Min 4X/week      PT Plan Current plan remains appropriate    Co-evaluation              AM-PAC PT "6 Clicks" Mobility   Outcome Measure  Help needed turning from your back to your side while in a flat bed without using bedrails?: A Little Help needed moving from lying on your back to sitting on the side of a flat bed without using bedrails?: A Lot Help needed moving to and from a bed to a chair (including a wheelchair)?: A Little Help needed standing up from a chair using your arms (e.g., wheelchair or bedside chair)?: A Lot Help needed to walk in hospital room?: A Lot Help needed climbing 3-5 steps with a railing? : Total 6 Click Score: 13    End of Session Equipment Utilized During Treatment: Gait belt (up high away from drain) Activity Tolerance: Patient limited by fatigue;Patient limited by pain Patient left: in bed;with call bell/phone within reach;with bed alarm set;with family/visitor present;Other (comment) (B heels floating via pillow support) Nurse Communication: Mobility  status;Precautions;Patient requests pain meds PT Visit Diagnosis: Muscle weakness (generalized) (M62.81);Difficulty in walking, not elsewhere classified (R26.2)     Time: 4098-1191 PT Time Calculation (min) (ACUTE ONLY): 30 min  Charges:  $Therapeutic Activity: 23-37 mins                     Hendricks Limes, PT 01/01/23, 2:28 PM

## 2023-01-01 NOTE — Progress Notes (Signed)
Progress Note   Patient: [name] Young. ZOX:[id number] DOB: 1940/10/18 DOA: 12/28/2022     4 DOS: the patient was seen and examined on 01/01/2023   Brief hospital course: Mr. Binnie, Vonderhaar. 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 Showed large right pleural effusion, large liver abscess of 15.8 x8.6 x9.8 cm. Patient is placed on broad-spectrum antibiotics changed to Zosyn.  IR drain performed on 4/19.  Culture came back with E. coli, pansensitive. Consult from ID obtained.    Principal Problem:   Hepatic abscess Active Problems:   Essential hypertension   GERD (gastroesophageal reflux disease)   Benign prostatic hyperplasia   DNR (do not resuscitate)   S/P CABG x 3   FTT (failure to thrive) in adult   Paroxysmal atrial fibrillation   Chronic HFrEF (heart failure with reduced ejection fraction)   Shortness of breath   History of endocarditis   History of bacteremia   Protein-calorie malnutrition, severe   Reactive thrombocytosis   Pleural effusion, right   Metabolic acidosis   Assessment and Plan:  Hepatic abscess Right large pleural effusion Patient did not meet sepsis criteria at time of admission, patient has a very large liver abscess.  Antibiotic changed to Zosyn, CT scan also showed large right pleural effusion which is recurrent.  Blood cultures so far no growth.  Hepatic abscess probably due to bacteremia from last year. Will discuss with ID once culture results available. Patient had IR drain of abscess and a right-sided  thoracentesis.  Removed 550 of purulent fluid from the liver, 900 mm of yellow pleural fluid. Pleural fluid consistent with exudates, likely reactive to abscess of the liver. Gram stain from abscess positive for gram-negative rods, will continue current antibiotic with Zosyn until the final culture results available. Patient short of breath much improved after procedure. Liver abscess drain still in place, culture came back with E. Coli, pansensitive. Will obtain ID consult for change antibiotics and determine the length of treatment. Repeat chest x-ray to evaluate pleural effusion.   Protein-calorie malnutrition, severe Obesity with BMI 30.7. FTT (failure to thrive) in adult Patient has been having poor appetite for the last few months, he has significant weight loss.  Continue protein supplement.   History of bacteremia and bacterial endocarditis. History of E. coli and Streptococcus bacteremia, repeat blood cultures negative. Echocardiogram showed ejection fraction 50 to 55% with grade 1 diastolic dysfunction.  Calcified mitral valve and aortic valve with mild mitral regurgitation and moderate aortic stenosis. ID will evaluate patient today.   Paroxysmal atrial fibrillation Restarted Eliquis.   Essential hypertension Chronic diastolic congestive heart failure. No evidence of volume overload this time.         Subjective:  Patient still have some abdominal pain with cough or taking deep breath.  Had a lumbar large bowel movement yesterday, no BM today.  No nausea vomiting.  Short of breath have improved.  Physical Exam: Vitals:   12/31/22 1931 12/31/22 2324 01/01/23 0330 01/01/23 0832  BP: (!) 116/41 (!) 117/59 (!) 122/37 (!) 107/56  Pulse: (!) 41 82 (!) 43 96  Resp: Temp: 98  F (36.7 C) 98.3 F (36.8 C) 97.8 F (36.6 C) 97.7 F (36.5 C)  TempSrc: Oral Oral Oral Oral  SpO2: 96% 98% 94% 96%  Weight:   97.5 kg    General exam: Appears calm and comfortable   Respiratory system: Clear to auscultation. Respiratory effort normal. Cardiovascular system: S1 & S2 heard, RRR. No JVD, murmurs, rubs, gallops or clicks. No pedal edema. Gastrointestinal system: Abdomen is nondistended, soft and nontender. No organomegaly or masses felt. Normal bowel sounds heard. Central nervous system: Alert and oriented. No focal neurological deficits. Extremities: Symmetric 5 x 5 power. Skin: No rashes, lesions or ulcers Psychiatry: Judgement and insight appear normal. Mood & affect appropriate.    Data Reviewed:  Lab results reviewed.  Family Communication: Updated at bedside.  Disposition: Status is: Inpatient Remains inpatient appropriate because: Severity of disease, IV treatment. Pending SNF placement.     Time spent: 35 minutes  Author: Marrion Coy, MD 01/01/2023 9:57 AM  For on call review [email or web address].

## 2023-01-01 NOTE — Plan of Care (Signed)

## 2023-01-01 NOTE — Consult Note (Signed)
NAME: [name] Young.  DOB: 11/04/1940  MRN: [id number]  Date/Time: 01/01/2023 11:04 AM  REQUESTING PROVIDER: Dr.Zhang Subjective:  REASON FOR CONSULT: liver abscess ? [name] Young. is a 82 y.o. with a history of  CAD/CABG/Afib/Streptococcus and ecoli bacteremia in feb 2023 due to ascendign cholangitis from choledocholithiasis s/p ERCP 2/212/3, mitral valve endocarditis treated with 6 weeks of IV cefazolin presented on 12/28/22 from cardiology office with increasing SOB   ID   Steroid/immune suppressants/splenectomy/Hardware Recent Procedure Surgery Injections Trauma Sick contacts Travel Antibiotic use Food- raw/exotic Animal bites Tick exposure Water sports Fishing/hunting/animal bird exposure Past Medical History:  Diagnosis Date   Adenomatous colon polyp    Aortic atherosclerosis    Arthritis    Ascending aorta dilatation    a.) TTE 11/23/2017: asc Ao measured 37 mm; b.) TTE 10/23/2021: Ao root measured 41 mm, asc Ao measured 38 mm; c.) TEE 10/28/2021: asc Ao measured 38 mm; d.) TTE 01/11/2022: Ao root 40 mm, asc Ao 39 mm   Ascending cholangitis 10/2021   BPH (benign prostatic hyperplasia)    Cardiomyopathy    CKD (chronic kidney disease), stage III    Claustrophobia    Coronary artery disease    a.) LHC 12/21/2017: 75% mLAD, 90% D1, 80-99% OM1/2, CTO pRCA (L-R collaterals) --> CVTS consult. 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   In setting of bacteremia from ascending colangitis   GERD (gastroesophageal reflux disease)    HFrEF (heart failure with reduced ejection fraction)    a.) TTE 11/23/2017: EF 50-55%, mod MAC, triv TR, G1DD; b.) TTE 10/23/2021: EF 40-45%, mild LAE, Ao sclerosis, triv MR, G2DD; c.) TEE 10/28/2021: EF 40-45%, glob HK, mobile vegitation on MV; d.) TTE 01/11/2022: EF 50-55%, mild LVH, RVE, Ao sclerosis, mild MR/AR, G1DD   History of cholelithiasis    History of kidney stones    ca  ox [facility] @ [facility]) now [employer] [facility]   History of pneumonia    HLD (hyperlipidemia)    HTN (hypertension)    Jaundice    age 36   Long term current use of anticoagulant    a.) apixaban   Paroxysmal atrial fibrillation    a.) CHA2DS2VASc = 6 (age x 2, HFrEF, HTN, vascular disease history, T2DM);  b.) rate/rhythm maintained on oral metoprolol succinate; chronically anticoagulated with apixaban   Pneumonia    Right-sided carotid artery disease    a.) carotid doppler 04/05/2022: 1-39% RICA   S/P CABG x 3    a.) LIMA-LAD, SVG-diagonal, SVG-PL branch of RCA   S/P cataract extraction and insertion of intraocular lens    T2DM (type 2 diabetes mellitus) 2010    Past Surgical History:  Procedure Laterality Date   CARPAL TUNNEL RELEASE Bilateral    CATARACT EXTRACTION, BILATERAL     COLONOSCOPY  11/2012   12 adenomatous polyps, diverticulosis, rec rpt 1 yr [name] Young)   COLONOSCOPY  12/2013   3 polyps, diverticulosis, rec rpt 3 yrs [name] Young)   COLONOSCOPY  06/2019   6 polyps (TA), diverticulosis, f/u left open ended [name] Young)   CORONARY ARTERY BYPASS GRAFT N/A 01/18/2018   Procedure: CORONARY ARTERY BYPASS GRAFTING (CABG) x 3; Using Left Internal Mammary Artery, and Right Greater Saphenous Vein harvested Endoscopically, Coronary Artery Endarterectomy;  Surgeon: Kerin Perna, MD;  Location: [facility] OR;  Service: Open Heart Surgery;  Laterality: N/A;   ERCP N/A 10/23/2021   Procedure: ENDOSCOPIC RETROGRADE CHOLANGIOPANCREATOGRAPHY (ERCP);  Surgeon: Meryl Dare, MD;  Location: [facility] ENDOSCOPY;  Service: Endoscopy;  Laterality: N/A;   IR THORACENTESIS ASP PLEURAL SPACE W/IMG GUIDE  12/29/2022   KNEE CARTILAGE SURGERY Left    LEFT HEART CATH AND CORONARY ANGIOGRAPHY N/A 12/21/2017   Procedure: LEFT HEART CATH AND CORONARY ANGIOGRAPHY;  Surgeon: Yvonne Kendall, MD;  Location: [facility] INVASIVE CV LAB;  Service: Cardiovascular;  Laterality: N/A;   LITHOTRIPSY     REMOVAL OF STONES  10/23/2021   Procedure:  REMOVAL OF STONES;  Surgeon: Meryl Dare, MD;  Location: [facility] ENDOSCOPY;  Service: Endoscopy;;   SPHINCTEROTOMY  10/23/2021   Procedure: Dennison Mascot;  Surgeon: Meryl Dare, MD;  Location: [facility] ENDOSCOPY;  Service: Endoscopy;;   TEE WITHOUT CARDIOVERSION N/A 01/18/2018   Procedure: TRANSESOPHAGEAL ECHOCARDIOGRAM (TEE);  Surgeon: Donata Clay, Theron Arista, MD;  Location: [facility] OR;  Service: Open Heart Surgery;  Laterality: N/A;   TEE WITHOUT CARDIOVERSION N/A 10/28/2021   Procedure: TRANSESOPHAGEAL ECHOCARDIOGRAM (TEE);  Surgeon: Chrystie Nose, MD;  Location: [facility] ENDOSCOPY;  Service: Cardiovascular;  Laterality: N/A;   UMBILICAL HERNIA REPAIR     with mesh    Social History   Socioeconomic History   Marital status: Married    Spouse name: Not on file   Number of children: Not on file   Years of education: Not on file   Highest education level: 12th grade  Occupational History   Not on file  Tobacco Use   Smoking status: Never    Passive exposure: Past   Smokeless tobacco: Former    Types: Chew    Quit date: 09/08/2001  Vaping Use   Vaping Use: Never used  Substance and Sexual Activity   Alcohol use: Not Currently    Comment: occ   Drug use: No   Sexual activity: Not Currently  Other Topics Concern   Not on file  Social History Narrative   Caffeine: occasional Lives with wife, grandson [name] Young 2009)Occupation: retired, 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.Diet: some water, fruits/vegetables daily, avoids potatoes   Social Determinants of Health   Financial Resource Strain: Low Risk  (12/26/2022)   Overall Financial Resource Strain (CARDIA)    Difficulty of Paying Living Expenses: Not hard at all  Food Insecurity: Patient Declined (12/26/2022)   Hunger Vital Sign    Worried About Running Out of Food in the Last Year: Patient declined    Ran Out of Food in the Last Year: Patient declined  Transportation Needs: No Transportation Needs (12/26/2022)   PRAPARE -  Administrator, Civil Service (Medical): No    Lack of Transportation (Non-Medical): No  Physical Activity: Unknown (12/26/2022)   Exercise Vital Sign    Days of Exercise per Week: Patient declined    Minutes of Exercise per Session: Not on file  Stress: No Stress Concern Present (12/26/2022)   [employer] of Occupational Health - Occupational Stress Questionnaire    Feeling of Stress : Not at all  Social Connections: Unknown (12/26/2022)   Social Connection and Isolation Panel [NHANES]    Frequency of Communication with Friends and Family: Patient declined    Frequency of Social Gatherings with Friends and Family: Patient declined    Attends Religious Services: Patient declined    Database administrator or Organizations: Yes    Attends Banker Meetings: Patient declined    Marital Status: Married  Catering manager Violence: Not on file    Family History  Problem  Relation Age of Onset   Alzheimer's disease Mother    Breast cancer Mother        breast   Atrial fibrillation Mother    CAD Mother    Stroke Father 70   Diabetes Father    CAD Father    Colon polyps Sister    Colon polyps Brother    Rectal cancer Maternal Grandfather        rectal   Colon cancer Maternal Grandfather 71   Esophageal cancer Neg Hx    Stomach cancer Neg Hx    No Known Allergies I? Current Facility-Administered Medications  Medication Dose Route Frequency Provider Last Rate Last Admin   acetaminophen (TYLENOL) tablet 650 mg  650 mg Oral Q6H PRN Cox, Amy N, DO   650 mg at 01/01/23 1610   Or   acetaminophen (TYLENOL) suppository 650 mg  650 mg Rectal Q6H PRN Cox, Amy N, DO       acetaminophen (TYLENOL) tablet 325 mg  325 mg Oral QHS PRN Marrion Coy, MD   325 mg at 12/31/22 2124   And   diphenhydrAMINE (BENADRYL) capsule 25 mg  25 mg Oral QHS PRN Marrion Coy, MD   25 mg at 12/31/22 2123   apixaban (ELIQUIS) tablet 5 mg  5 mg Oral BID Marrion Coy, MD   5 mg at 01/01/23  1050   cholecalciferol (VITAMIN D3) 25 MCG (1000 UNIT) tablet 1,000 Units  1,000 Units Oral Daily Cox, Amy N, DO   1,000 Units at 01/01/23 1051   cyanocobalamin (VITAMIN B12) tablet 1,000 mcg  1,000 mcg Oral Daily Marrion Coy, MD   1,000 mcg at 01/01/23 1050   feeding supplement (ENSURE ENLIVE / ENSURE PLUS) liquid 237 mL  237 mL Oral BID BM Marrion Coy, MD   237 mL at 01/01/23 1000   hydrALAZINE (APRESOLINE) injection 5 mg  5 mg Intravenous Q8H PRN Cox, Amy N, DO       nitroGLYCERIN (NITROSTAT) SL tablet 0.4 mg  0.4 mg Sublingual Q5 min PRN Cox, Amy N, DO       ondansetron (ZOFRAN) tablet 4 mg  4 mg Oral Q6H PRN Cox, Amy N, DO       Or   ondansetron (ZOFRAN) injection 4 mg  4 mg Intravenous Q6H PRN Cox, Amy N, DO       oxyCODONE-acetaminophen (PERCOCET/ROXICET) 5-325 MG per tablet 1-2 tablet  1-2 tablet Oral Q6H PRN Richarda Overlie, MD   2 tablet at 12/30/22 0856   pantoprazole (PROTONIX) EC tablet 80 mg  80 mg Oral Daily Cox, Amy N, DO   80 mg at 01/01/23 1050   piperacillin-tazobactam (ZOSYN) IVPB 3.375 g  3.375 g Intravenous Q8H Zhang, Freda Munro, MD 12.5 mL/hr at 01/01/23 1059 3.375 g at 01/01/23 1059   senna-docusate (Senokot-S) tablet 1 tablet  1 tablet Oral QHS PRN Cox, Amy N, DO       sodium bicarbonate tablet 650 mg  650 mg Oral BID Marrion Coy, MD   650 mg at 01/01/23 1050   sodium chloride flush (NS) 0.9 % injection 5 mL  5 mL Intracatheter Q8H Richarda Overlie, MD   5 mL at 01/01/23 0556   tamsulosin (FLOMAX) capsule 0.4 mg  0.4 mg Oral Daily Marrion Coy, MD   0.4 mg at 01/01/23 1050     Abtx:  Anti-infectives (From admission, onward)    Start     Dose/Rate Route Frequency Ordered Stop   12/29/22 1800  vancomycin (VANCOREADY) IVPB  1250 mg/250 mL  Status:  Discontinued       See Hyperspace for full Linked Orders Report.   1,250 mg 166.7 mL/hr over 90 Minutes Intravenous Every 24 hours 12/28/22 1603 12/29/22 1022   12/29/22 1800  vancomycin (VANCOREADY) IVPB 1750 mg/350 mL  Status:   Discontinued       See Hyperspace for full Linked Orders Report.   1,750 mg 175 mL/hr over 120 Minutes Intravenous Every 24 hours 12/29/22 1022 12/29/22 1247   12/29/22 1400  piperacillin-tazobactam (ZOSYN) IVPB 3.375 g        3.375 g 12.5 mL/hr over 240 Minutes Intravenous Every 8 hours 12/29/22 1248     12/28/22 2200  ceFEPIme (MAXIPIME) 2 g in sodium chloride 0.9 % 100 mL IVPB  Status:  Discontinued        2 g 200 mL/hr over 30 Minutes Intravenous Every 8 hours 12/28/22 1603 12/29/22 1247   12/28/22 1615  vancomycin (VANCOREADY) IVPB 1500 mg/300 mL       See Hyperspace for full Linked Orders Report.   1,500 mg 150 mL/hr over 120 Minutes Intravenous  Once 12/28/22 1603 12/28/22 2023   12/28/22 1545  metroNIDAZOLE (FLAGYL) IVPB 500 mg  Status:  Discontinued        500 mg 100 mL/hr over 60 Minutes Intravenous Every 12 hours 12/28/22 1535 12/29/22 1247   12/28/22 1515  ceFEPIme (MAXIPIME) 2 g in sodium chloride 0.9 % 100 mL IVPB        2 g 200 mL/hr over 30 Minutes Intravenous  Once 12/28/22 1506 12/28/22 1609   12/28/22 1515  metroNIDAZOLE (FLAGYL) IVPB 500 mg  Status:  Discontinued        500 mg 100 mL/hr over 60 Minutes Intravenous  Once 12/28/22 1506 12/28/22 1535   12/28/22 1515  vancomycin (VANCOCIN) IVPB 1000 mg/200 mL premix        1,000 mg 200 mL/hr over 60 Minutes Intravenous  Once 12/28/22 1506 12/28/22 1818       REVIEW OF SYSTEMS:  Const: negative fever, negative chills, negative weight loss Eyes: negative diplopia or visual changes, negative eye pain ENT: negative coryza, negative sore throat Resp: negative cough, hemoptysis, dyspnea Cards: negative for chest pain, palpitations, lower extremity edema GU: negative for frequency, dysuria and hematuria GI: Negative for abdominal pain, diarrhea, bleeding, constipation Skin: negative for rash and pruritus Heme: negative for easy bruising and gum/nose bleeding MS: negative for myalgias, arthralgias, back pain and muscle  weakness Neurolo:negative for headaches, dizziness, vertigo, memory problems  Psych: negative for feelings of anxiety, depression  Endocrine: negative for thyroid, diabetes Allergy/Immunology- negative for any medication or food allergies ? Pertinent Positives include : Objective:  VITALS:  BP (!) 107/56 (BP Location: Right Arm)   Pulse 96   Temp 97.7 F (36.5 C) (Oral)   Resp 18   Wt 97.5 kg   SpO2 96%   BMI 30.41 kg/m  LDA Foley Central line Other drainage tubes PHYSICAL EXAM:  General: Alert, cooperative, no distress, appears stated age.  Head: Normocephalic, without obvious abnormality, atraumatic. Eyes: Conjunctivae clear, anicteric sclerae. Pupils are equal ENT Nares normal. No drainage or sinus tenderness. Lips, mucosa, and tongue normal. No Thrush Neck: Supple, symmetrical, no adenopathy, thyroid: non tender no carotid bruit and no JVD. Back: No CVA tenderness. Lungs: Clear to auscultation bilaterally. No Wheezing or Rhonchi. No rales. Heart: Regular rate and rhythm, no murmur, rub or gallop. Abdomen: Soft, non-tender,not distended. Bowel sounds normal. No masses Extremities:  atraumatic, no cyanosis. No edema. No clubbing Skin: No rashes or lesions. Or bruising Lymph: Cervical, supraclavicular normal. Neurologic: Grossly non-focal Pertinent Labs Lab Results CBC    Component Value Date/Time   WBC 12.9 (H) 12/31/2022 0541   RBC 3.78 (L) 12/31/2022 0541   HGB 10.2 (L) 12/31/2022 0541   HGB 12.0 (L) 01/11/2018 0000   HCT 32.7 (L) 12/31/2022 0541   HCT 37.2 (L) 01/11/2018 0000   PLT 515 (H) 12/31/2022 0541   PLT 190 01/11/2018 0000   MCV 86.5 12/31/2022 0541   MCV 91 01/11/2018 0000   MCH 27.0 12/31/2022 0541   MCHC 31.2 12/31/2022 0541   RDW 15.9 (H) 12/31/2022 0541   RDW 14.2 01/11/2018 0000   LYMPHSABS 1.5 12/26/2022 1405   LYMPHSABS 1.8 01/11/2018 0000   MONOABS 1.1 (H) 12/26/2022 1405   EOSABS 0.1 12/26/2022 1405   EOSABS 0.1 01/11/2018 0000    BASOSABS 0.0 12/26/2022 1405   BASOSABS 0.0 01/11/2018 0000       Latest Ref Rng & Units 12/31/2022    5:41 AM 12/30/2022    4:18 AM 12/29/2022    3:29 AM  CMP  Glucose 70 - 99 mg/dL 409  811  914   BUN 8 - 23 mg/dL 32  32  33   Creatinine 0.61 - 1.24 mg/dL 7.82  9.56  2.13   Sodium 135 - 145 mmol/L 139  140  138   Potassium 3.5 - 5.1 mmol/L 3.9  4.1  4.2   Chloride 98 - 111 mmol/L 109  110  108   CO2 22 - 32 mmol/L 21  19  22    Calcium 8.9 - 10.3 mg/dL 8.3  8.3  8.3       Microbiology: Recent Results (from the past 240 hour(s))  Blood culture (routine x 2)     Status: None (Preliminary result)   Collection Time: 12/28/22  1:22 PM   Specimen: Left Antecubital; Blood  Result Value Ref Range Status   Specimen Description LEFT ANTECUBITAL  Final   Special Requests   Final    BOTTLES DRAWN AEROBIC AND ANAEROBIC Blood Culture adequate volume   Culture   Final    NO GROWTH 3 DAYS Performed at [facility], [address] [place], [place] [address]    Report Status PENDING  Incomplete  Blood culture (routine x 2)     Status: None (Preliminary result)   Collection Time: 12/28/22  1:43 PM   Specimen: BLOOD LEFT HAND  Result Value Ref Range Status   Specimen Description BLOOD LEFT HAND  Final   Special Requests   Final    BOTTLES DRAWN AEROBIC AND ANAEROBIC Blood Culture adequate volume   Culture   Final    NO GROWTH 3 DAYS Performed at [facility], [address] [place], [place] [address]    Report Status PENDING  Incomplete  Aerobic/Anaerobic Culture w Gram Stain (surgical/deep wound)     Status: None (Preliminary result)   Collection Time: 12/29/22  3:20 PM   Specimen: Abscess  Result Value Ref Range Status   Specimen Description   Final    ABSCESS Performed at [facility], [address] [place], [place] [address]    Special Requests   Final    NONE Performed at [facility], [address] [place], [place] [address]     Gram Stain   Final    MODERATE WBC PRESENT,BOTH PMN AND MONONUCLEAR MODERATE GRAM NEGATIVE RODS  Performed at [facility], [address], [place], [place] [address]    Culture   Final    ABUNDANT ESCHERICHIA COLI NO ANAEROBES ISOLATED; CULTURE IN PROGRESS FOR 5 DAYS    Report Status PENDING  Incomplete   Organism ID, Bacteria ESCHERICHIA COLI  Final      Susceptibility   Escherichia coli - MIC*    AMPICILLIN <=2 SENSITIVE Sensitive     CEFEPIME <=0.12 SENSITIVE Sensitive     CEFTAZIDIME <=1 SENSITIVE Sensitive     CEFTRIAXONE <=0.25 SENSITIVE Sensitive     CIPROFLOXACIN <=0.25 SENSITIVE Sensitive     GENTAMICIN <=1 SENSITIVE Sensitive     IMIPENEM <=0.25 SENSITIVE Sensitive     TRIMETH/SULFA <=20 SENSITIVE Sensitive     AMPICILLIN/SULBACTAM <=2 SENSITIVE Sensitive     PIP/TAZO <=4 SENSITIVE Sensitive     * ABUNDANT ESCHERICHIA COLI    IMAGING RESULTS: I have personally reviewed the films ? Impression/Recommendation ? ? ? ___________________________________________________ Discussed with patient, requesting provider Note:  This document was prepared using Dragon voice recognition software and may include unintentional dictation errors.

## 2023-01-02 ENCOUNTER — Other Ambulatory Visit: Payer: Self-pay

## 2023-01-02 DIAGNOSIS — I350 Nonrheumatic aortic (valve) stenosis: Secondary | ICD-10-CM

## 2023-01-02 DIAGNOSIS — K750 Abscess of liver: Secondary | ICD-10-CM | POA: Diagnosis not present

## 2023-01-02 DIAGNOSIS — J90 Pleural effusion, not elsewhere classified: Secondary | ICD-10-CM | POA: Diagnosis not present

## 2023-01-02 DIAGNOSIS — I5032 Chronic diastolic (congestive) heart failure: Secondary | ICD-10-CM | POA: Insufficient documentation

## 2023-01-02 DIAGNOSIS — I358 Other nonrheumatic aortic valve disorders: Secondary | ICD-10-CM

## 2023-01-02 LAB — BASIC METABOLIC PANEL
Anion gap: 8 (ref 5–15)
BUN: 36 mg/dL — ABNORMAL HIGH (ref 8–23)
CO2: 24 mmol/L (ref 22–32)
Calcium: 8.2 mg/dL — ABNORMAL LOW (ref 8.9–10.3)
Chloride: 108 mmol/L (ref 98–111)
Creatinine, Ser: 1.18 mg/dL (ref 0.61–1.24)
GFR, Estimated: >60 mL/min (ref >60)
Glucose, Bld: 114 mg/dL — ABNORMAL HIGH (ref 70–99)
Potassium: 4 mmol/L (ref 3.5–5.1)
Sodium: 140 mmol/L (ref 135–145)

## 2023-01-02 LAB — CULTURE, BLOOD (ROUTINE X 2)

## 2023-01-02 LAB — CBC
HCT: 33.3 % — ABNORMAL LOW (ref 39.0–52.0)
Hemoglobin: 10.3 g/dL — ABNORMAL LOW (ref 13.0–17.0)
MCH: 26.9 pg (ref 26.0–34.0)
MCHC: 30.9 g/dL (ref 30.0–36.0)
MCV: 86.9 fL (ref 80.0–100.0)
Platelets: 525 10*3/uL — ABNORMAL HIGH (ref 150–400)
RBC: 3.83 MIL/uL — ABNORMAL LOW (ref 4.22–5.81)
RDW: 15.9 % — ABNORMAL HIGH (ref 11.5–15.5)
WBC: 13.2 10*3/uL — ABNORMAL HIGH (ref 4.0–10.5)
nRBC: 0 % (ref 0.0–0.2)

## 2023-01-02 MED ORDER — SODIUM CHLORIDE 0.9 % IV SOLN
2.0000 g | INTRAVENOUS | Status: DC
  Administered 2023-01-02 – 2023-01-05 (×4): 2 g via INTRAVENOUS
  Filled 2023-01-02 (×4): qty 20

## 2023-01-02 NOTE — Treatment Plan (Signed)
OPAT Orders Discharge antibiotics: Ceftriaxone 2 g IV daily for 4 weeks End Date:01/25/23     [facility] Care Per Protocol:   Labs weekly on Monday while on IV antibiotics: _X_ CBC with differential   _X_ CMP     _X_ Please pull PIC at completion of IV antibiotics     Fax weekly lab results  promptly to [phone]   Clinic Follow Up Appt with Dr.Albertina Leise : 01/25/23 at 10.30am     Call [phone] with any questions

## 2023-01-02 NOTE — Progress Notes (Signed)
Referring Physician(s): Dr. Londell Moh  Supervising Physician: Pernell Dupre  Patient Status:  [facility] - In-pt  Chief Complaint: Peri-hepatic abscess s/p drain placement in IR 12/29/22 by Dr. Lowella Dandy. Patient also s/p thoracentesis for right pleural effusion.   Subjective: Patient asleep in bed. His wife is at the bedside and she states he just got pain medicine. He is resting comfortably.   Allergies: Patient has no known allergies.  Medications: Prior to Admission medications   Medication Sig Start Date End Date Taking? Authorizing Provider  apixaban (ELIQUIS) 5 MG TABS tablet TAKE 1 TABLET BY MOUTH TWICE A DAY 08/23/22  Yes End, [name], MD  Cholecalciferol (VITAMIN D3) 25 MCG (1000 UT) CAPS Take 1 capsule (1,000 Units total) by mouth daily. 05/24/20  Yes [name], MD  Cyanocobalamin (B-12) 1000 MCG CAPS Take by mouth daily at 2 am.   Yes [provider]  ENTRESTO 24-26 MG TAKE 1 TABLET BY MOUTH TWICE A DAY 09/25/22  Yes End, [name], MD  fenofibrate (TRICOR) 145 MG tablet TAKE 1 TABLET BY MOUTH EVERY DAY 10/05/22  Yes End, [name], MD  furosemide (LASIX) 40 MG tablet Take 1 tablet (40 mg total) by mouth daily as needed. Patient not taking: Reported on 12/28/2022 12/22/22   Tillman Abide I, MD  metoprolol succinate (TOPROL-XL) 50 MG 24 hr tablet TAKE 1 TABLET BY MOUTH DAILY. TAKE WITH OR IMMEDIATELY FOLLOWING A MEAL. 09/25/22  Yes End, [name], MD  Multiple Vitamins-Minerals (MACULAR VITAMIN BENEFIT PO) Take 1 tablet by mouth daily.   Yes [provider]  nitroGLYCERIN (NITROSTAT) 0.4 MG SL tablet Place 1 tablet (0.4 mg total) under the tongue every 5 (five) minutes as needed for chest pain. 03/26/20  Yes End, [name], MD  Omega-3 Fatty Acids (FISH OIL) 1200 MG CAPS Take 2 capsules (2,400 mg total) by mouth daily. 05/24/20  Yes [name], MD  rosuvastatin (CRESTOR) 10 MG tablet TAKE 1 TABLET BY MOUTH EVERY DAY 06/15/22  Yes End,  [name], MD  tamsulosin (FLOMAX) 0.4 MG CAPS capsule TAKE 1 CAPSULE BY MOUTH EVERY DAY 09/18/22  Yes [name], MD  ACCU-CHEK AVIVA PLUS test strip USE AS DIRECTED TO CHECK BLOOD SUGARS UPTO FOUR TIMES DAILY. Patient not taking: Reported on 12/28/2022 03/16/20   [name], MD  blood glucose meter kit and supplies Dispense based on patient and insurance preference. Use up to four times daily as directed. (FOR ICD-10 E10.9, E11.9). Patient not taking: Reported on 12/28/2022 02/01/18   Angiulli, Mcarthur Rossetti, PA-C  FARXIGA 5 MG TABS tablet Take 5 mg by mouth daily. Patient not taking: Reported on 12/28/2022 10/27/22   [provider]  metFORMIN (GLUCOPHAGE) 500 MG tablet TAKE 1 TABLET BY MOUTH EVERY DAY WITH BREAKFAST Patient not taking: Reported on 12/28/2022 09/18/22   [name], MD  omeprazole (PRILOSEC) 40 MG capsule TAKE 1 CAPSULE BY MOUTH EVERY MONDAY,WEDNESDAY, AND FRIDAY 01/01/23   [name], MD     Vital Signs: BP (!) 121/59 (BP Location: Right Arm)   Pulse 90   Temp 98 F (36.7 C) (Oral)   Resp 18   Wt 214 lb 15.2 oz (97.5 kg)   SpO2 98%   BMI 30.41 kg/m   Physical Exam Constitutional:      General: He is not in acute distress.    Appearance: He is not ill-appearing.  Pulmonary:     Effort: Pulmonary effort is normal.  Abdominal:     Palpations: Abdomen is soft.  Tenderness: There is no abdominal tenderness.     Comments: RUQ drain to suction. 25 ml of serosanguineous fluid in bulb. Drain easily flushed with 5 ml NS. Dressing is clean, dry and intact.   Skin:    General: Skin is warm and dry.     Imaging: [place] EKG SITE RITE  Result Date: 01/02/2023 If Site Rite image not attached, placement could not be confirmed due to current cardiac rhythm.  DG Chest 2 View  Result Date: 01/01/2023 CLINICAL DATA:  Pleural effusion RIGHT EXAM: CHEST - 2 VIEW COMPARISON:  12/29/2022 FINDINGS: Upper normal size of cardiac silhouette post CABG.  Mediastinal contours and pulmonary vascularity normal. Atherosclerotic calcification aorta. Bibasilar atelectasis and trace pleural effusions. Upper lungs clear. No pneumothorax or acute osseous findings. IMPRESSION: Bibasilar atelectasis and trace pleural effusions. Aortic Atherosclerosis (ICD10-I70.0). Electronically Signed   By: Ulyses Southward M.D.   On: 01/01/2023 10:51   ECHOCARDIOGRAM COMPLETE  Result Date: 12/30/2022    ECHOCARDIOGRAM REPORT   Patient Name:   [name] Young. Date of Exam: 12/30/2022 Medical Rec #:  [id number]            Height:       70.5 in Accession #:    [id number]           Weight:       217.0 lb Date of Birth:  December 14, 1940           BSA:          2.172 m Patient Age:    81 years             BP:           111/59 mmHg Patient Gender: M                    HR:           88 bpm. Exam Location:  [facility] Procedure: 2D Echo Indications:     Dyspnea R06.00                  Endocarditis I38  History:         Patient has prior history of Echocardiogram examinations, most                  recent 01/11/2022.  Sonographer:     Overton Mam RDCS, FASE Referring Phys:  [id number] AMY N COX Diagnosing Phys: Debbe Odea MD  Sonographer Comments: Technically difficult study due to poor echo windows and no subcostal window. Image acquisition challenging due to respiratory motion. IMPRESSIONS  1. Left ventricular ejection fraction, by estimation, is 50 to 55%. Left ventricular ejection fraction by PLAX is 50 %. The left ventricle has low normal function. The left ventricle has no regional wall motion abnormalities. Left ventricular diastolic parameters are consistent with Grade I diastolic dysfunction (impaired relaxation).  2. Right ventricular systolic function is normal. The right ventricular size is normal.  3. The mitral valve is degenerative. Mild mitral valve regurgitation. Moderate mitral annular calcification.  4. Aortic valve mean gradient 19 mmHg, peak gradient 35 mmHg, vmax 2.53m/s. The  aortic valve is calcified. Aortic valve regurgitation is mild. Mild to moderate aortic valve stenosis.  5. Aortic dilatation noted. There is mild dilatation of the aortic root, measuring 40 mm. FINDINGS  Left Ventricle: Left ventricular ejection fraction, by estimation, is 50 to 55%. Left ventricular ejection fraction by PLAX is 50 %. The left ventricle has low normal function. The  left ventricle has no regional wall motion abnormalities. The left ventricular internal cavity size was normal in size. There is no left ventricular hypertrophy. Left ventricular diastolic parameters are consistent with Grade I diastolic dysfunction (impaired relaxation). Right Ventricle: The right ventricular size is normal. No increase in right ventricular wall thickness. Right ventricular systolic function is normal. Left Atrium: Left atrial size was normal in size. Right Atrium: Right atrial size was normal in size. Pericardium: There is no evidence of pericardial effusion. Mitral Valve: The mitral valve is degenerative in appearance. Moderate mitral annular calcification. Mild mitral valve regurgitation. MV peak gradient, 5.7 mmHg. The mean mitral valve gradient is 2.0 mmHg. Tricuspid Valve: The tricuspid valve is grossly normal. Tricuspid valve regurgitation is mild. Aortic Valve: Aortic valve mean gradient 19 mmHg, peak gradient 35 mmHg, vmax 2.45m/s. The aortic valve is calcified. Aortic valve regurgitation is mild. Aortic regurgitation PHT measures 638 msec. Mild to moderate aortic stenosis is present. Aortic valve  mean gradient measures 15.0 mmHg. Aortic valve peak gradient measures 28.0 mmHg. Aortic valve area, by VTI measures 2.83 cm. Pulmonic Valve: The pulmonic valve was normal in structure. Pulmonic valve regurgitation is mild. Aorta: Aortic dilatation noted. There is mild dilatation of the aortic root, measuring 40 mm. Venous: The inferior vena cava was not well visualized. IAS/Shunts: No atrial level shunt detected by  color flow Doppler.  LEFT VENTRICLE PLAX 2D LV EF:         Left            Diastology                ventricular     LV e' medial:    3.70 cm/s                ejection        LV E/e' medial:  18.0                fraction by     LV e' lateral:   12.50 cm/s                PLAX is 50      LV E/e' lateral: 5.3                %. LVIDd:         4.70 cm LVIDs:         3.50 cm LV PW:         1.20 cm LV IVS:        1.20 cm LVOT diam:     2.40 cm LV SV:         127 LV SV Index:   58 LVOT Area:     4.52 cm  LV Volumes (MOD) LV vol d, MOD    104.0 ml A4C: LV vol s, MOD    41.3 ml A4C: LV SV MOD A4C:   104.0 ml RIGHT VENTRICLE RV Basal diam:  2.60 cm RV S prime:     15.20 cm/s LEFT ATRIUM             Index        RIGHT ATRIUM           Index LA diam:        3.50 cm 1.61 cm/m   RA Area:     13.20 cm LA Vol (A2C):   35.2 ml 16.20 ml/m  RA Volume:   33.00 ml  15.19 ml/m LA Vol (A4C):  43.7 ml 20.12 ml/m LA Biplane Vol: 41.5 ml 19.10 ml/m  AORTIC VALVE                     PULMONIC VALVE AV Area (Vmax):    2.12 cm      PV Vmax:        1.78 m/s AV Area (Vmean):   2.00 cm      PV Peak grad:   12.7 mmHg AV Area (VTI):     2.83 cm      RVOT Peak grad: 2 mmHg AV Vmax:           264.67 cm/s AV Vmean:          179.333 cm/s AV VTI:            0.448 m AV Peak Grad:      28.0 mmHg AV Mean Grad:      15.0 mmHg LVOT Vmax:         124.00 cm/s LVOT Vmean:        79.200 cm/s LVOT VTI:          0.280 m LVOT/AV VTI ratio: 0.62 AI PHT:            638 msec  AORTA Ao Root diam: 4.00 cm MITRAL VALVE MV Area (PHT): 2.28 cm     SHUNTS MV Area VTI:   4.01 cm     Systemic VTI:  0.28 m MV Peak grad:  5.7 mmHg     Systemic Diam: 2.40 cm MV Mean grad:  2.0 mmHg MV Vmax:       1.19 m/s MV Vmean:      59.3 cm/s MV Decel Time: 333 msec MV E velocity: 66.60 cm/s MV A velocity: 119.00 cm/s MV E/A ratio:  0.56 Debbe Odea MD Electronically signed by Debbe Odea MD Signature Date/Time: 12/30/2022/4:15:35 PM    Final    IR THORACENTESIS ASP  PLEURAL SPACE W/IMG GUIDE  Result Date: 12/29/2022 INDICATION: 82 year old with large perihepatic abscess and right pleural effusion. EXAM: 1. Ultrasound-guided placement of drainage catheter in the perihepatic abscess 2. Ultrasound-guided right thoracentesis MEDICATIONS: Moderate sedation ANESTHESIA/SEDATION: Moderate (conscious) sedation was employed during this procedure. A total of Versed 2mg  and fentanyl 50 mcg was administered intravenously at the order of the provider performing the procedure. Total intra-service moderate sedation time: 47 minutes. Patient's level of consciousness and vital signs were monitored continuously by radiology nurse throughout the procedure under the supervision of the provider performing the procedure. COMPLICATIONS: None immediate. PROCEDURE: Informed written consent was obtained from the patient after a thorough discussion of the procedural risks, benefits and alternatives. All questions were addressed. Maximal Sterile Barrier Technique was utilized including caps, mask, sterile gowns, sterile gloves, sterile drape, hand hygiene and skin antiseptic. A timeout was performed prior to the initiation of the procedure. Right upper abdomen was evaluated with ultrasound. The large perihepatic abscess was identified. The right upper abdomen was prepped with chlorhexidine and sterile field was created. Skin was anesthetized using 1% lidocaine. Small incision was made. Using ultrasound guidance, Yueh catheter was directed into the large perihepatic abscess. Yellow purulent fluid was aspirated. Superstiff Amplatz wire was placed. The tract was dilated to accommodate a 12 [place] multipurpose drain. 550 mL of purulent fluid was removed. Drain was sutured to the skin and attached to a suction bulb. Fluid was sent for culture. The right posterior chest was evaluated with ultrasound. Right pleural fluid was identified with ultrasound. A new sterile  tray was opened. Right posterior chest was  prepped with chlorhexidine and a sterile field was created. Skin was anesthetized using 1% lidocaine. Small incision was made. Using ultrasound guidance, a Safe-T-Centesis catheter was directed into the right pleural space. Yellow fluid was aspirated. 900 mL of fluid was removed. Catheter was removed and a bandage was placed. FINDINGS: 550 mL of purulent fluid was removed from the perihepatic collection. Perihepatic abscess appeared to be decompressed at the end of the procedure. 900 mL of yellow fluid was removed from the right pleural space. IMPRESSION: 1. Ultrasound-guided placement of a drainage catheter in the large perihepatic abscess. 2. Ultrasound-guided right thoracentesis. Electronically Signed   By: Richarda Overlie M.D.   On: 12/29/2022 20:00   IR IMAGE GUIDED DRAINAGE BY PERCUTANEOUS CATHETER  Result Date: 12/29/2022 INDICATION: 82 year old with large perihepatic abscess and right pleural effusion. EXAM: 1. Ultrasound-guided placement of drainage catheter in the perihepatic abscess 2. Ultrasound-guided right thoracentesis MEDICATIONS: Moderate sedation ANESTHESIA/SEDATION: Moderate (conscious) sedation was employed during this procedure. A total of Versed 2mg  and fentanyl 50 mcg was administered intravenously at the order of the provider performing the procedure. Total intra-service moderate sedation time: 47 minutes. Patient's level of consciousness and vital signs were monitored continuously by radiology nurse throughout the procedure under the supervision of the provider performing the procedure. COMPLICATIONS: None immediate. PROCEDURE: Informed written consent was obtained from the patient after a thorough discussion of the procedural risks, benefits and alternatives. All questions were addressed. Maximal Sterile Barrier Technique was utilized including caps, mask, sterile gowns, sterile gloves, sterile drape, hand hygiene and skin antiseptic. A timeout was performed prior to the initiation of the  procedure. Right upper abdomen was evaluated with ultrasound. The large perihepatic abscess was identified. The right upper abdomen was prepped with chlorhexidine and sterile field was created. Skin was anesthetized using 1% lidocaine. Small incision was made. Using ultrasound guidance, Yueh catheter was directed into the large perihepatic abscess. Yellow purulent fluid was aspirated. Superstiff Amplatz wire was placed. The tract was dilated to accommodate a 12 [place] multipurpose drain. 550 mL of purulent fluid was removed. Drain was sutured to the skin and attached to a suction bulb. Fluid was sent for culture. The right posterior chest was evaluated with ultrasound. Right pleural fluid was identified with ultrasound. A new sterile tray was opened. Right posterior chest was prepped with chlorhexidine and a sterile field was created. Skin was anesthetized using 1% lidocaine. Small incision was made. Using ultrasound guidance, a Safe-T-Centesis catheter was directed into the right pleural space. Yellow fluid was aspirated. 900 mL of fluid was removed. Catheter was removed and a bandage was placed. FINDINGS: 550 mL of purulent fluid was removed from the perihepatic collection. Perihepatic abscess appeared to be decompressed at the end of the procedure. 900 mL of yellow fluid was removed from the right pleural space. IMPRESSION: 1. Ultrasound-guided placement of a drainage catheter in the large perihepatic abscess. 2. Ultrasound-guided right thoracentesis. Electronically Signed   By: Richarda Overlie M.D.   On: 12/29/2022 20:00   DG Chest Port 1 View  Result Date: 12/29/2022 CLINICAL DATA:  Post thoracentesis. EXAM: PORTABLE CHEST 1 VIEW COMPARISON:  12/28/2022 FINDINGS: Sternotomy wires unchanged. Lungs are adequately inflated with interval improvement and previously seen right pleural effusion as no residual pleural fluid is identified. No evidence of right-sided pneumothorax. Left lung is clear. Cardiomediastinal  silhouette and remainder of the exam is unchanged. IMPRESSION: Interval improvement in previously seen right pleural effusion post thoracentesis.  No pneumothorax. Electronically Signed   By: Elberta Fortis M.D.   On: 12/29/2022 16:02    Labs:  CBC: Recent Labs    12/29/22 0329 12/30/22 0418 12/31/22 0541 01/02/23 0429  WBC 9.8 13.9* 12.9* 13.2*  HGB 10.1* 10.6* 10.2* 10.3*  HCT 32.2* 33.8* 32.7* 33.3*  PLT 471* 514* 515* 525*    COAGS: Recent Labs    12/29/22 0329  INR 1.6*    BMP: Recent Labs    12/29/22 0329 12/30/22 0418 12/31/22 0541 01/02/23 0429  NA 138 140 139 140  K 4.2 4.1 3.9 4.0  CL 108 110 109 108  CO2 22 19* 21* 24  GLUCOSE 115* 108* 117* 114*  BUN 33* 32* 32* 36*  CALCIUM 8.3* 8.3* 8.3* 8.2*  CREATININE 1.07 1.07 1.08 1.18  GFRNONAA >60 >60 >60 >60    LIVER FUNCTION TESTS: Recent Labs    06/20/22 0729 12/21/22 1455 12/27/22 0807 12/28/22 1135 12/28/22 1233  BILITOT 0.5 1.1  --  0.6 0.4  AST 22 8  --  32 26  ALT 13 4  --  15 11  ALKPHOS 64 47  --  65 74  PROT 6.8 6.5  --  6.8 6.1  ALBUMIN 3.2* 3.3* 2.9* 2.5* 2.9*    Assessment and Plan:  Peri-hepatic abscess s/p drain placement in IR 12/29/22 by Dr. Lowella Dandy. Patient also s/p thoracentesis for right pleural effusion.   Drain Location: RUQ Size: Fr size: 12 Fr Date of placement: 12/29/22 Currently to: Drain collection device: suction bulb 24 hour output:  Output by Drain (mL) 12/31/22 0700 - 12/31/22 1459 12/31/22 1500 - 12/31/22 2259 12/31/22 2300 - 01/01/23 0659 01/01/23 0700 - 01/01/23 1459 01/01/23 1500 - 01/01/23 2259 01/01/23 2300 - 01/02/23 0659 01/02/23 0700 - 01/02/23 1107  Closed System Drain RUQ Bulb (JP) 12 Fr.  40 40   70     Interval imaging/drain manipulation:  none  Current examination: Flushes/aspirates easily.  Insertion site unremarkable. Suture and stat lock in place. Dressed appropriately.   Plan: Continue TID flushes with 5 cc NS. Record output Q  shift. Dressing changes QD or PRN if soiled.  Call IR APP or on call IR MD if difficulty flushing or sudden change in drain output.  Repeat imaging/possible drain injection once output < 10 mL/QD (excluding flush material). Consideration for drain removal if output is < 10 mL/QD (excluding flush material), pending discussion with the providing surgical service.  Discharge planning: An order has been placed for the patient to follow up with IR in 10-14 days. A scheduler from our office will call him with a date/time. Drain care teaching done at the bedside with the patient's wife. Normal saline flushes were also given to the patient's wife.  Patient will need to flush drain daily with 5 cc NS, record output daily, dressing changes every 2-3 days or earlier if soiled. The patient's wife knows the site can't get wet. She was encouraged to call our team if she has any questions/concerns prior to his follow up visit. The patient's wife is unsure when discharge will occur and she states he may be discharged to a rehab facility.   IR will continue to follow - please call with questions or concerns.  Electronically Signed: Alwyn Ren, AGACNP-BC [phone] 01/02/2023, 11:05 AM     I spent a total of 15 Minutes at the the patient's bedside AND on the patient's hospital floor or unit, greater than 50% of which was counseling/coordinating care for  peri-hepatic abscess drain.

## 2023-01-02 NOTE — Progress Notes (Signed)
Spoke with patient's wife. She and patient is agreeable to SNF. She requested a list of SNF. Medicare.gov list provided.

## 2023-01-02 NOTE — Progress Notes (Signed)
Physical Therapy Treatment Patient Details Name: [name] Young. MRN: [id number] DOB: [birth year] Today's Date: 01/02/2023   History of Present Illness Mr. Gruenwald is a 82 y/o male with PMH including CABGx3, a-fib, and shortness of breath. Presented to ER secondary to SOB, progressive weakness; admitted for management of hepatic abscess (s/p perihepatic drain) and large R pleural effusion (s/p R thoracentesis)    PT Comments    Pt resting in recliner upon PT arrival; pt reporting 10/10 pain on his bottom from sitting in recliner and requesting back to bed (nursing present and gave pt pain medication).  During session pt min to mod assist with transfers using RW; CGA to ambulate a few feet recliner to bed with RW use; and mod assist for LE's to lay back down in bed.  Pt declined to ambulate further than recliner to bed d/t fatigue and generalized weakness.  Pt reporting improved comfort resting in bed end of session.  Will continue to focus on strengthening, endurance, and progressive functional mobility per pt tolerance.    Recommendations for follow up therapy are one component of a multi-disciplinary discharge planning process, led by the attending physician.  Recommendations may be updated based on patient status, additional functional criteria and insurance authorization.  Follow Up Recommendations  Can patient physically be transported by private vehicle: No    Assistance Recommended at Discharge Frequent or constant Supervision/Assistance  Patient can return home with the following A lot of help with walking and/or transfers;A lot of help with bathing/dressing/bathroom;Assistance with cooking/housework;Assist for transportation;Help with stairs or ramp for entrance   Equipment Recommendations       Recommendations for Other Services       Precautions / Restrictions Precautions Precautions: Fall Precaution Comments: RUQ abdominal/perihepatic JP drain Restrictions Weight  Bearing Restrictions: No     Mobility  Bed Mobility Overal bed mobility: Needs Assistance Bed Mobility: Sit to Supine       Sit to supine: Mod assist (assist for B LE's)   General bed mobility comments: vc's for technique    Transfers Overall transfer level: Needs assistance Equipment used: Rolling walker (2 wheels) Transfers: Sit to/from Stand Sit to Stand: Min assist, Mod assist           General transfer comment: assist to initiate stand and control descent sitting; vc's for technique    Ambulation/Gait Ambulation/Gait assistance: Min guard Gait Distance (Feet): 3 Feet (bed to recliner) Assistive device: Rolling walker (2 wheels)   Gait velocity: decreased     General Gait Details: decreased B LE step length/foot clearance; increased effort to take steps; limited d/t pt fatigue   Stairs             Wheelchair Mobility    Modified Rankin (Stroke Patients Only)       Balance Overall balance assessment: Needs assistance Sitting-balance support: No upper extremity supported, Feet supported Sitting balance-Leahy Scale: Good Sitting balance - Comments: steady sitting reaching within BOS   Standing balance support: Bilateral upper extremity supported, During functional activity Standing balance-Leahy Scale: Fair Standing balance comment: steady static standing with B UE support on RW                            Cognition Arousal/Alertness: Awake/alert Behavior During Therapy: WFL for tasks assessed/performed Overall Cognitive Status: Within Functional Limits for tasks assessed  Exercises General Exercises - Lower Extremity Long Arc Quad: AROM, Strengthening, Both, 10 reps, Seated Hip Flexion/Marching: AAROM, Left, AROM, Right, Strengthening, 5 reps, Seated    General Comments General comments (skin integrity, edema, etc.): RUQ JP drain intact beginning/end of session (no  drainage noted on dressing).  Nursing cleared pt for participation in physical therapy.  Pt agreeable to PT session.  Pt's wife present during session.      Pertinent Vitals/Pain Pain Assessment Pain Assessment: Faces Faces Pain Scale: Hurts even more Pain Location: pt's bottom (sitting in recliner) Pain Descriptors / Indicators: Aching, Grimacing Pain Intervention(s): Limited activity within patient's tolerance, Monitored during session, Repositioned, Patient requesting pain meds-RN notified, RN gave pain meds during session Vitals (HR and O2 on room air) stable and WFL throughout treatment session.    Home Living                          Prior Function            PT Goals (current goals can now be found in the care plan section) Acute Rehab PT Goals Patient Stated Goal: to return home PT Goal Formulation: With patient/family Time For Goal Achievement: 01/13/23 Potential to Achieve Goals: Fair Progress towards PT goals: Progressing toward goals    Frequency    Min 4X/week      PT Plan Current plan remains appropriate    Co-evaluation              AM-PAC PT "6 Clicks" Mobility   Outcome Measure  Help needed turning from your back to your side while in a flat bed without using bedrails?: A Little Help needed moving from lying on your back to sitting on the side of a flat bed without using bedrails?: A Lot Help needed moving to and from a bed to a chair (including a wheelchair)?: A Little Help needed standing up from a chair using your arms (e.g., wheelchair or bedside chair)?: A Lot Help needed to walk in hospital room?: A Lot Help needed climbing 3-5 steps with a railing? : Total 6 Click Score: 13    End of Session Equipment Utilized During Treatment: Gait belt (up high away from drain) Activity Tolerance: Patient limited by fatigue;Patient limited by pain Patient left: in bed;with call bell/phone within reach;with bed alarm set;with family/visitor  present;Other (comment) (B heels floating via pillow support) Nurse Communication: Mobility status;Precautions;Patient requests pain meds PT Visit Diagnosis: Muscle weakness (generalized) (M62.81);Difficulty in walking, not elsewhere classified (R26.2)     Time: 6578-4696 PT Time Calculation (min) (ACUTE ONLY): 31 min  Charges:  $Therapeutic Exercise: 8-22 mins $Therapeutic Activity: 8-22 mins                    Hendricks Limes, PT 01/02/23, 12:15 PM

## 2023-01-02 NOTE — Progress Notes (Signed)
PHARMACY CONSULT NOTE FOR:  OUTPATIENT  PARENTERAL ANTIBIOTIC THERAPY (OPAT)  Indication: E coli liver abscess Regimen: Ceftriaxone 2gm IV q24h End date: 01/25/2023  Weekly labs on Mondays: CBC with diff, CMP Please pull PIC at completion of IV antibiotics Fax weekly lab results  promptly to [phone]  IV antibiotic discharge orders are pended. To discharging provider:  please sign these orders via discharge navigator,  Select New Orders & click on the button choice - Manage This Unsigned Work.     Thank you for allowing pharmacy to be a part of this patient's care.  Juliette Alcide, PharmD, BCPS, BCIDP Work Cell: [phone] 01/02/2023 1:48 PM

## 2023-01-02 NOTE — Plan of Care (Signed)

## 2023-01-02 NOTE — Progress Notes (Signed)
Progress Note   Patient: [name] Young. WUJ:[id number] DOB: April 16, 1941 DOA: 12/28/2022     5 DOS: the patient was seen and examined on 01/02/2023   Brief hospital course: [name] Young, [name]. 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 Showed large right pleural effusion, large liver abscess of 15.8 x8.6 x9.8 cm. Patient is placed on broad-spectrum antibiotics changed to Zosyn.  IR drain performed on 4/19.  Culture came back with E. coli, pansensitive. Consult from ID obtained.  Recommended 4 weeks ceftriaxone, with final dose of 5/16. OPAT placed     Principal Problem:   Hepatic abscess Active Problems:   Essential hypertension   GERD (gastroesophageal reflux disease)   Benign prostatic hyperplasia   Obesity (BMI 30-39.9)   DNR (do not resuscitate)   S/P CABG x 3   FTT (failure to thrive) in adult   Paroxysmal atrial fibrillation   Chronic HFrEF (heart failure with reduced ejection fraction)   Shortness of breath   History of endocarditis   History of bacteremia   Protein-calorie malnutrition, severe   Reactive thrombocytosis   Pleural effusion, right   Metabolic acidosis   Assessment and Plan: Hepatic abscess Right large pleural effusion Patient did not meet sepsis criteria at time of admission, patient has a very large liver abscess.  Antibiotic changed to Zosyn, CT scan also showed large right pleural effusion which is recurrent.  Blood cultures so far no growth.  Hepatic abscess probably due to bacteremia from last year. Will  discuss with ID once culture results available. Patient had IR drain of abscess and a right-sided thoracentesis.  Removed 550 of purulent fluid from the liver, 900 mm of yellow pleural fluid. Pleural fluid consistent with exudates, likely reactive to abscess of the liver. Gram stain from abscess positive for gram-negative rods, will continue current antibiotic with Zosyn until the final culture results available. Patient short of breath much improved after procedure. Liver abscess drain still in place, culture came back with E. Coli, pansensitive. Repeated chest x-ray on he had a trace bilateral pleural effusion.  Patient currently on cefazolin.  But we will need 4 weeks of IV antibiotics with ceftriaxone.   Protein-calorie malnutrition, severe Obesity with BMI 30.7. FTT (failure to thrive) in adult Patient has been having poor appetite for the last few months, he has significant weight loss.  Continue protein supplement.   History of bacteremia and bacterial endocarditis. Aortic stenosis. History of E. coli and Streptococcus bacteremia, repeat blood cultures negative. Echocardiogram showed ejection fraction 50 to 55% with grade 1 diastolic dysfunction.  Calcified mitral valve and aortic valve with mild mitral regurgitation and moderate aortic stenosis. Seen by ID, no recurrence of endocarditis.   Paroxysmal atrial fibrillation Restarted Eliquis.   Essential hypertension Chronic diastolic congestive heart failure. No evidence of volume overload this time.        Subjective:  Patient doing much better, short of breath is improved.  Had bowel movements after stool softener.  No nausea vomiting.  Still has some intermittent abdominal pain due to hepatic drain tube.  Physical Exam: Vitals:   01/01/23 2053 01/02/23 0038 01/02/23 0405 01/02/23 0800  BP: (!) 110/48 (!) 114/56 100/69 (!) 121/59  Pulse: 80 80 84 90  Resp: Temp: 98.6 F (37 C) 98.5 F (36.9 C) 98.5 F  (36.9 C) 98 F (36.7 C)  TempSrc:  Oral  Oral  SpO2: 98% 98% 97% 98%  Weight:       General exam: Appears calm and comfortable  Respiratory system: Clear to auscultation. Respiratory effort normal. Cardiovascular system: S1 & S2 heard, RRR. No JVD, murmurs, rubs, gallops or clicks. No pedal edema. Gastrointestinal system: Abdomen is nondistended, soft and nontender. No organomegaly or masses felt. Normal bowel sounds heard. Central nervous system: Alert and oriented. No focal neurological deficits. Extremities: Symmetric 5 x 5 power. Skin: No rashes, lesions or ulcers Psychiatry: Judgement and insight appear normal. Mood & affect appropriate.    Data Reviewed:  Lab results reviewed.  Family Communication: Wife updated at the bedside.  Disposition: Status is: Inpatient Remains inpatient appropriate because: Severity of her disease, IV treatment. PT and OT SNF placement, family yet to decide if will take patient home with home care versus SNF.     Time spent: 35 minutes  Author: Marrion Coy, MD 01/02/2023 10:37 AM  For on call review [email or web address].

## 2023-01-02 NOTE — Progress Notes (Signed)
Occupational Therapy Treatment Patient Details Name: Daemion Mcniel. MRN: [id number] DOB: 03/28/1941 Today's Date: 01/02/2023   History of present illness Mr. [name] is a 82 y/o male with PMH including CABGx3, a-fib, and shortness of breath. Presented to ER secondary to SOB, progressive weakness; admitted for management of hepatic abscess (s/p perihepatic drain) and large R pleural effusion (s/p R thoracentesis)   OT comments  Upon entering the room, pt supine in bed and agreeable to OT intervention and wife present in room. Pt reports some discomfort in abdomen and buttocks during session. He is very motivated to be independent and is able to perform bed mobility with min guard this session. Pt needing increased time and cuing for hand placement and technique. Pt declines use of RW and stands with mod lifting assistance from standard be height and takes several steps to recliner chair to the L with min A. Pt sits in recliner chair with goal of 30 minutes to an hour. Call bell and all needed items within reach. Pt's wife also remains in room at end of session. Pt continues to benefit from OT intervention.    Recommendations for follow up therapy are one component of a multi-disciplinary discharge planning process, led by the attending physician.  Recommendations may be updated based on patient status, additional functional criteria and insurance authorization.    Assistance Recommended at Discharge Frequent or constant Supervision/Assistance  Patient can return home with the following  A little help with walking and/or transfers;A little help with bathing/dressing/bathroom;Assistance with cooking/housework;Direct supervision/assist for medications management;Direct supervision/assist for financial management;Assist for transportation;Help with stairs or ramp for entrance   Equipment Recommendations  BSC/3in1       Precautions / Restrictions Precautions Precautions: Fall Precaution  Comments: RUQ abdominal/perihepatic JP drain Restrictions Weight Bearing Restrictions: No       Mobility Bed Mobility Overal bed mobility: Needs Assistance Bed Mobility: Supine to Sit     Supine to sit: Min guard     General bed mobility comments: with cuing for technique and hand placement    Transfers Overall transfer level: Needs assistance Equipment used: 1 person hand held assist Transfers: Sit to/from Stand Sit to Stand: Mod assist           General transfer comment: Pt declined use of RW and stands with mod lifting assistance from standard bed height     Balance Overall balance assessment: Needs assistance Sitting-balance support: No upper extremity supported, Feet supported Sitting balance-Leahy Scale: Good Sitting balance - Comments: steady sitting reaching within BOS   Standing balance support: Bilateral upper extremity supported, During functional activity Standing balance-Leahy Scale: Fair                             ADL either performed or assessed with clinical judgement    Extremity/Trunk Assessment Upper Extremity Assessment Upper Extremity Assessment: Generalized weakness   Lower Extremity Assessment Lower Extremity Assessment: Generalized weakness        Vision Patient Visual Report: No change from baseline            Cognition Arousal/Alertness: Awake/alert Behavior During Therapy: WFL for tasks assessed/performed Overall Cognitive Status: Within Functional Limits for tasks assessed  Pertinent Vitals/ Pain       Pain Assessment Pain Assessment: Faces Faces Pain Scale: Hurts a little bit Pain Location: abdomen Pain Descriptors / Indicators: Aching, Grimacing Pain Intervention(s): Limited activity within patient's tolerance, Monitored during session, Repositioned         Frequency  Min 2X/week        Progress Toward Goals  OT  Goals(current goals can now be found in the care plan section)  Progress towards OT goals: Progressing toward goals     Plan Discharge plan remains appropriate;Frequency remains appropriate       AM-PAC OT "6 Clicks" Daily Activity     Outcome Measure   Help from another person eating meals?: None Help from another person taking care of personal grooming?: A Little Help from another person toileting, which includes using toliet, bedpan, or urinal?: A Little Help from another person bathing (including washing, rinsing, drying)?: A Lot Help from another person to put on and taking off regular upper body clothing?: A Little Help from another person to put on and taking off regular lower body clothing?: A Lot 6 Click Score: 17    End of Session Equipment Utilized During Treatment: Rolling walker (2 wheels)  OT Visit Diagnosis: Unsteadiness on feet (R26.81)   Activity Tolerance Patient limited by fatigue;Patient limited by pain   Patient Left in bed;with call bell/phone within reach;with nursing/sitter in room;with family/visitor present   Nurse Communication Mobility status;Patient requests pain meds        Time: 1000-1019 OT Time Calculation (min): 19 min  Charges: OT General Charges $OT Visit: 1 Visit OT Treatments $Therapeutic Activity: 8-22 mins  Jackquline Denmark, MS, OTR/L , CBIS ascom [phone]  01/02/23, 10:25 AM

## 2023-01-02 NOTE — Progress Notes (Addendum)
Date of Admission:  12/28/2022   T   ID: [name] Young. is a 82 y.o. male  Principal Problem:   Hepatic abscess Active Problems:   Essential hypertension   GERD (gastroesophageal reflux disease)   Benign prostatic hyperplasia   Obesity (BMI 30-39.9)   DNR (do not resuscitate)   S/P CABG x 3   FTT (failure to thrive) in adult   Paroxysmal atrial fibrillation   Shortness of breath   History of endocarditis   History of bacteremia   Protein-calorie malnutrition, severe   Reactive thrombocytosis   Pleural effusion, right   Metabolic acidosis   Moderate aortic stenosis   Chronic diastolic CHF (congestive heart failure)  [name] Young. is a 82 y.o. with a history of  CAD/CABG/Afib/Streptococcus and ecoli bacteremia in feb 2023 due to ascendign cholangitis from choledocholithiasis s/p ERCP 2/212/3, mitral valve endocarditis treated with 6 weeks of IV cefazolin presented on 12/28/22 from cardiology office with fatigue not feeling well.  Increasing SOB. The past couple of months he has had thoracentesis.  The last 1 was on 12/22/2022.  In the ED on 418 vitals were 102/63, temperature 97.9, heart rate 73, respiratory 22 and pulse ox of 98%. WBC was 10.6, Hb 12.1, platelets 627 and creatinine of 1.17.  Chest x-ray on 12/28/2022 showed enlarged cardiac silhouette with small right pleural effusion.  CT angio done on the same day did not show any pulmonary embolism.  There was a large right pleural effusion volume estimated to be of 1 L.  Gross cardiomegaly and aortic and mitral valve calcification.  Aortic atherosclerosis.  There was an enlarging 15 x 18 to 9.8 cm complex fluid collection along the ventral margin of the liver increase since the CAT scan done in August 2023.  This was consistent with an enlarging subcapsular perihepatic abscess.  Blood culture was sent and patient was already on antibiotics.  Patient had a thoracentesis on 12/29/2022.   He had a drain placed in the liver  abscess on 12/29/2022.  500 cc of purulent fluid removed from the perihepatic collection. The pleural fluid culture was negative The liver abscess culture had E. coli.  Patient is currently on Zosyn    Subjective: Patient is doing fine, bit sleepy from the pain meds Wife at bedside  Medications:   apixaban  5 mg Oral BID   cholecalciferol  1,000 Units Oral Daily   cyanocobalamin  1,000 mcg Oral Daily   feeding supplement  237 mL Oral BID BM   pantoprazole  80 mg Oral Daily   rosuvastatin  10 mg Oral Daily   sodium bicarbonate  650 mg Oral BID   sodium chloride flush  5 mL Intracatheter Q8H   tamsulosin  0.4 mg Oral Daily    Objective: Vital signs in last 24 hours: Patient Vitals for the past 24 hrs:  BP Temp Temp src Pulse Resp SpO2  01/02/23 1503 (!) 128/58 97.8 F (36.6 C) -- 94 18 98 %  01/02/23 1113 (!) 145/48 (!) 97.5 F (36.4 C) Oral (!) 44 18 98 %  01/02/23 0800 (!) 121/59 98 F (36.7 C) Oral 90 18 98 %  01/02/23 0405 100/69 98.5 F (36.9 C) -- 84 17 97 %  01/02/23 0038 (!) 114/56 98.5 F (36.9 C) Oral 80 18 98 %  01/01/23 2053 (!) 110/48 98.6 F (37 C) -- 80 18 98 %      PHYSICAL EXAM:  General: Patient was somnolent but woke  up Easily and was conversant Lungs: Bilateral air entry Heart: Irregular but rate well-controlled. Abdomen: Soft, non-tender,not distended.  Right upper quadrant drain  extremities: Edema legs Skin: No rashes or lesions. Or bruising Lymph: Cervical, supraclavicular normal. Neurologic: Grossly non-focal  Lab Results    Latest Ref Rng & Units 01/02/2023    4:29 AM 12/31/2022    5:41 AM 12/30/2022    4:18 AM  CBC  WBC 4.0 - 10.5 K/uL 13.2  12.9  13.9   Hemoglobin 13.0 - 17.0 g/dL 16.1  09.6  04.5   Hematocrit 39.0 - 52.0 % 33.3  32.7  33.8   Platelets 150 - 400 K/uL 525  515  514        Latest Ref Rng & Units 01/02/2023    4:29 AM 12/31/2022    5:41 AM 12/30/2022    4:18 AM  CMP  Glucose 70 - 99 mg/dL 409  811  914   BUN 8 - 23  mg/dL 36  32  32   Creatinine 0.61 - 1.24 mg/dL 7.82  9.56  2.13   Sodium 135 - 145 mmol/L 140  139  140   Potassium 3.5 - 5.1 mmol/L 4.0  3.9  4.1   Chloride 98 - 111 mmol/L 108  109  110   CO2 22 - 32 mmol/L Calcium 8.9 - 10.3 mg/dL 8.2  8.3  8.3       Microbiology: 12/28/2022 Blood culture no growth 12/29/2022 Liver abscess culture E. coli Studies/Results: [place] EKG SITE RITE  Result Date: 01/02/2023 If Site Rite image not attached, placement could not be confirmed due to current cardiac rhythm.  DG Chest 2 View  Result Date: 01/01/2023 CLINICAL DATA:  Pleural effusion RIGHT EXAM: CHEST - 2 VIEW COMPARISON:  12/29/2022 FINDINGS: Upper normal size of cardiac silhouette post CABG. Mediastinal contours and pulmonary vascularity normal. Atherosclerotic calcification aorta. Bibasilar atelectasis and trace pleural effusions. Upper lungs clear. No pneumothorax or acute osseous findings. IMPRESSION: Bibasilar atelectasis and trace pleural effusions. Aortic Atherosclerosis (ICD10-I70.0). Electronically Signed   By: Ulyses Southward M.D.   On: 01/01/2023 10:51     Assessment/Plan:  Liver abscess status post drain placement E. coli in culture Patient in 2023 had E. coli and Streptococcus bacteremia following ascending cholangitis due to choledocholithiasis for which she got ERCP and stone removal  During that hospitalization he also had mitral valve endocarditis and was treated with 6 weeks of cefazolin His liver abscess is secondary to the choledocholithiasis and ascending cholangitis from the time.  He had a small abscess last year on the CT scan which has gotten worse now. Patient will need minimum 4 weeks of IV ceftriaxone.  He will have to follow-up with interventional radiology regarding removal of the drain.  Congestive heart.  Right pleural effusion.  Recurrent has had multiple thoracentesis.  CAD status post CABG  Anemia  Paroxysmal A-fib on Eliquis OPAT  Orders Discharge antibiotics: Ceftriaxone 2 g IV daily for 4 weeks End Date:01/25/23     [facility] Care Per Protocol:   Labs weekly on Monday while on IV antibiotics: _X_ CBC with differential   _X_ CMP     _X_ Please pull PIC at completion of IV antibiotics     Fax weekly lab results  promptly to [phone]   Clinic Follow Up Appt with Dr.Alli Jasmer : 01/25/23 at 10.30am     Call [phone] with any questions  Discussed management with the patient  and his wife at bedside. Discussed the management with Dr. Chipper Herb ID will sign off. Call if needed.

## 2023-01-03 DIAGNOSIS — I5032 Chronic diastolic (congestive) heart failure: Secondary | ICD-10-CM

## 2023-01-03 DIAGNOSIS — K750 Abscess of liver: Secondary | ICD-10-CM | POA: Diagnosis not present

## 2023-01-03 DIAGNOSIS — E872 Acidosis, unspecified: Secondary | ICD-10-CM

## 2023-01-03 DIAGNOSIS — E669 Obesity, unspecified: Secondary | ICD-10-CM

## 2023-01-03 DIAGNOSIS — D75838 Other thrombocytosis: Secondary | ICD-10-CM

## 2023-01-03 DIAGNOSIS — R338 Other retention of urine: Secondary | ICD-10-CM

## 2023-01-03 DIAGNOSIS — K219 Gastro-esophageal reflux disease without esophagitis: Secondary | ICD-10-CM

## 2023-01-03 DIAGNOSIS — N401 Enlarged prostate with lower urinary tract symptoms: Secondary | ICD-10-CM

## 2023-01-03 DIAGNOSIS — E43 Unspecified severe protein-calorie malnutrition: Secondary | ICD-10-CM | POA: Diagnosis not present

## 2023-01-03 DIAGNOSIS — J90 Pleural effusion, not elsewhere classified: Secondary | ICD-10-CM | POA: Diagnosis not present

## 2023-01-03 DIAGNOSIS — R627 Adult failure to thrive: Secondary | ICD-10-CM

## 2023-01-03 LAB — AEROBIC/ANAEROBIC CULTURE W GRAM STAIN (SURGICAL/DEEP WOUND)

## 2023-01-03 MED ORDER — CHLORHEXIDINE GLUCONATE CLOTH 2 % EX PADS
6.0000 | MEDICATED_PAD | Freq: Every day | CUTANEOUS | Status: DC
  Administered 2023-01-03 – 2023-01-05 (×3): 6 via TOPICAL

## 2023-01-03 MED ORDER — SODIUM CHLORIDE 0.9 % IV SOLN: INTRAVENOUS | Status: DC

## 2023-01-03 MED ORDER — SODIUM CHLORIDE 0.9% FLUSH
10.0000 mL | Freq: Two times a day (BID) | INTRAVENOUS | Status: DC
  Administered 2023-01-03 – 2023-01-05 (×5): 10 mL

## 2023-01-03 MED ORDER — MIRTAZAPINE 15 MG PO TABS
7.5000 mg | ORAL_TABLET | Freq: Every day | ORAL | Status: DC
  Administered 2023-01-03 – 2023-01-04 (×2): 7.5 mg via ORAL
  Filled 2023-01-03 (×2): qty 1

## 2023-01-03 MED ORDER — SODIUM CHLORIDE 0.9% FLUSH
10.0000 mL | INTRAVENOUS | Status: DC | PRN
  Administered 2023-01-04: 10 mL

## 2023-01-03 NOTE — Assessment & Plan Note (Signed)
Improved

## 2023-01-03 NOTE — Progress Notes (Signed)
PT Cancellation Note  Patient Details Name: [name] Young. MRN: [id number] DOB: 1941/07/22   Cancelled Treatment:    Reason Eval/Treat Not Completed: Other (comment) Attempted to see pt this AM and he had just gotten up to the recliner.  Returned ~30 minutes later and medical team was working with him.  On yet another attempt not long after he had gotten back to bed and did not wish to do anything more at this time, stating he was too tired/worn out.  Issued cushion to facilitate more time/comfort in sitting going forward.  Will maintain on caseload and continue to treat as appropriate.    Malachi Pro 01/03/2023, 2:01 PM

## 2023-01-03 NOTE — Assessment & Plan Note (Addendum)
Platelet count [id number].

## 2023-01-03 NOTE — Assessment & Plan Note (Addendum)
No signs of heart failure currently.  Will restart [name] Young as outpatient.  Lasix only as needed for shortness of breath or weight gain of 3 pounds in a day or 5 pounds in a week.  Hold Entresto for systolic blood pressure less than 100.  Hold Toprol for now.  Patient had low blood pressure upon coming into the hospital.  Last ejection fraction 50 to 55% (last year did have an EF of 40 to 45%).  Daily weights.

## 2023-01-03 NOTE — TOC Progression Note (Signed)
Transition of Care (TOC) - Progression Note    Patient Details  Name: [name] Young. MRN: [id number] Date of Birth: 1941-05-21  Transition of Care [facility]) CM/SW Contact  Truddie Hidden, RN Phone Number: 01/03/2023, 1:26 PM  Clinical Narrative:    Spoke with patient's wife regarding list of SNF. She would like for [facility] as a first choice. She stated patient has been to that facility in the past. She was advised once bed offer is obtained insurance approval would have to be obtained.         Expected Discharge Plan and Services                                               Social Determinants of Health (SDOH) Interventions SDOH Screenings   Food Insecurity: Patient Declined (12/26/2022)  Housing: Low Risk  (12/26/2022)  Transportation Needs: No Transportation Needs (12/26/2022)  Depression (PHQ2-9): Medium Risk (12/26/2022)  Financial Resource Strain: Low Risk  (12/26/2022)  Physical Activity: Unknown (12/26/2022)  Social Connections: Unknown (12/26/2022)  Stress: No Stress Concern Present (12/26/2022)  Tobacco Use: Medium Risk (12/29/2022)    Readmission Risk Interventions     No data to display

## 2023-01-03 NOTE — Assessment & Plan Note (Signed)
BMI 30.90

## 2023-01-03 NOTE — Assessment & Plan Note (Signed)
Continue to monitor as outpatient

## 2023-01-03 NOTE — Assessment & Plan Note (Signed)
900 mL drawn off underneath the right lung on 4/19.

## 2023-01-03 NOTE — Progress Notes (Signed)
Physical Therapy Treatment Patient Details Name: [name] Young. MRN: [id number] DOB: 20-Nov-1940 Today's Date: 01/03/2023   History of Present Illness [name] Young is a 82 y/o male with PMH including CABGx3, a-fib, and shortness of breath. Presented to ER secondary to SOB, progressive weakness; admitted for management of hepatic abscess (s/p perihepatic drain) and large R pleural effusion (s/p R thoracentesis)    PT Comments    Pt initially hesitant to do a lot but PT and wife convinced him to try at least a little activity.  He agreed and was actually able to ambulate ~12 ft with slow, but safe gait using walker.  He reports significant fatigue with the effort but O2 remained in the 90s and HR only increased slightly with the effort (90s to 100s).  He showed good effort with bed mobility and transition to standing needing only light assist with each.  Pt interested in eating dinner after ambulation, further exercises, etc deferred.     Recommendations for follow up therapy are one component of a multi-disciplinary discharge planning process, led by the attending physician.  Recommendations may be updated based on patient status, additional functional criteria and insurance authorization.  Follow Up Recommendations  Can patient physically be transported by private vehicle: No    Assistance Recommended at Discharge Frequent or constant Supervision/Assistance  Patient can return home with the following A lot of help with walking and/or transfers;A lot of help with bathing/dressing/bathroom;Assistance with cooking/housework;Assist for transportation;Help with stairs or ramp for entrance   Equipment Recommendations       Recommendations for Other Services       Precautions / Restrictions Precautions Precautions: Fall Restrictions Weight Bearing Restrictions: No     Mobility  Bed Mobility Overal bed mobility: Needs Assistance Bed Mobility: Sit to Supine     Supine to sit: Min  assist     General bed mobility comments: gave arm to pull on for light assist, able to slowly get LEs toward EOB    Transfers Overall transfer level: Needs assistance Equipment used: Rolling walker (2 wheels) Transfers: Sit to/from Stand Sit to Stand: From elevated surface, Min assist           General transfer comment: light assist to initiate stand and control descent sitting; VCs for technique    Ambulation/Gait Ambulation/Gait assistance: Min guard Gait Distance (Feet): 12 Feet Assistive device: Rolling walker (2 wheels)         General Gait Details: Pt expectedly reliant on UEs but with plenty of cuing and encouagement was actually able to take some slow but relatively safe steps around the foot of the bed.  Pt's O2 remains stable though he does report much fatigue with the effort.   Stairs             Wheelchair Mobility    Modified Rankin (Stroke Patients Only)       Balance Overall balance assessment: Needs assistance Sitting-balance support: No upper extremity supported, Feet supported Sitting balance-Leahy Scale: Good     Standing balance support: Bilateral upper extremity supported, During functional activity Standing balance-Leahy Scale: Fair Standing balance comment: steady static standing with B UE support on RW                            Cognition Arousal/Alertness: Awake/alert Behavior During Therapy: WFL for tasks assessed/performed Overall Cognitive Status: Within Functional Limits for tasks assessed  Exercises      General Comments General comments (skin integrity, edema, etc.): Pt initially hesitant to do a lot but wife present and encourages participation      Pertinent Vitals/Pain Pain Assessment Pain Assessment: Faces Faces Pain Scale: Hurts even more Pain Location: belly and bottom    Home Living                          Prior Function             PT Goals (current goals can now be found in the care plan section) Progress towards PT goals: Progressing toward goals    Frequency    Min 4X/week      PT Plan Current plan remains appropriate    Co-evaluation              AM-PAC PT "6 Clicks" Mobility   Outcome Measure  Help needed turning from your back to your side while in a flat bed without using bedrails?: A Little Help needed moving from lying on your back to sitting on the side of a flat bed without using bedrails?: A Lot Help needed moving to and from a bed to a chair (including a wheelchair)?: A Little Help needed standing up from a chair using your arms (e.g., wheelchair or bedside chair)?: A Little Help needed to walk in hospital room?: A Lot Help needed climbing 3-5 steps with a railing? : Total 6 Click Score: 14    End of Session Equipment Utilized During Treatment: Gait belt Activity Tolerance: Patient limited by fatigue;Patient limited by pain Patient left: with chair alarm set;with call bell/phone within reach;with family/visitor present Nurse Communication: Mobility status PT Visit Diagnosis: Muscle weakness (generalized) (M62.81);Difficulty in walking, not elsewhere classified (R26.2)     Time: 1720-1736 PT Time Calculation (min) (ACUTE ONLY): 16 min  Charges:  $Gait Training: 8-22 mins                     Malachi Pro, DPT 01/03/2023, 6:16 PM

## 2023-01-03 NOTE — Progress Notes (Addendum)
Mobility Specialist - Progress Note   01/03/23 1118  Mobility  Activity Ambulated with assistance in room;Transferred from bed to chair  Level of Assistance Standby assist, set-up cues, supervision of patient - no hands on  Assistive Device Front wheel walker  Distance Ambulated (ft) 5 ft  Activity Response Tolerated well  $Mobility charge 1 Mobility    Pt lying in bed upon arrival, utilizing RA. Pt very pleasant and agreeable to activity. Pt completed bed mobility with maxA. STS from elevated bed height with minA. +2 available for safety, but not necessarily needed. Step-pivot and retro stepping to recliner with minG. No LOB. VC for hand placement. Pt very appreciative and left in chair with alarm set, needs in reach. Breakfast tray set up in front of pt.    Filiberto Pinks Mobility Specialist 01/03/23, 11:24 AM

## 2023-01-03 NOTE — Plan of Care (Signed)

## 2023-01-03 NOTE — Progress Notes (Signed)
Progress Note   Patient: [name] Young. ZOX:[id number] DOB: [birth year] DOA: 12/28/2022     6 DOS: the patient was seen and examined on 01/03/2023   Brief hospital course: [name] Young, Isbell. 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 Showed large right pleural effusion, large liver abscess of 15.8 x8.6 x9.8 cm. Patient is placed on broad-spectrum antibiotics changed to Zosyn.  IR drain performed on 4/19.  Culture came back with E. coli, pansensitive. Consult from ID obtained.  Recommended 4 weeks ceftriaxone, with final dose of 5/16. OPAT placed.  4/24.  Patient's daughter concerned that his urine is dark and wanted IV fluids overnight.  Explained that the patient needs to eat and drink.  Started on Remeron for poor appetite and sleep.    Assessment and Plan: * Hepatic abscess IR drain of abscess removed 550 mL of purulent fluid.  E. coli growing out of the cultures.  Now on IV Rocephin and will continue Rocephin through 01/25/2023.  Pleural effusion, right 900 mL drawn off underneath the right lung on 4/19.  Protein-calorie malnutrition, severe Continue supplements.  Encouraged to eat.  Will start Remeron at night.  FTT (failure to thrive) in adult Continue supplements.  Chronic diastolic CHF (congestive heart failure) No signs of heart failure currently.  Moderate aortic stenosis Continue to monitor as outpatient  Metabolic acidosis Improved get rid of sodium bicarb.  Reactive thrombocytosis Platelet count 525.  Paroxysmal atrial  fibrillation Continue Eliquis  Obesity (BMI 30-39.9) BMI 30.31  Benign prostatic hyperplasia Continue Flomax  GERD (gastroesophageal reflux disease) PPI resumed        Subjective: Patient has some abdominal pain.  Poor appetite.  Admitted with liver abscess.  Physical Exam: Vitals:   01/02/23 2354 01/03/23 0424 01/03/23 0833 01/03/23 1106  BP: 127/65 122/72 124/62 115/83  Pulse: 84 86 88 (!) 54  Resp: Temp: 98.5 F (36.9 C) 98 F (36.7 C) 98.1 F (36.7 C) 98.1 F (36.7 C)  TempSrc:  Oral    SpO2: 96% 97% 96% 99%  Weight:  97.2 kg     Physical Exam HENT:     Head: Normocephalic.     Mouth/Throat:     Pharynx: No oropharyngeal exudate.  Eyes:     General: Lids are normal.     Conjunctiva/sclera: Conjunctivae normal.  Cardiovascular:     Rate and Rhythm: Normal rate and regular rhythm.     Heart sounds: Normal heart sounds, S1 normal and S2 normal.  Pulmonary:     Breath sounds: No decreased breath sounds, wheezing, rhonchi or rales.  Abdominal:     Palpations: Abdomen is soft.     Tenderness: There is abdominal tenderness in the right upper quadrant.  Musculoskeletal:     Right lower leg: Swelling present.     Left lower leg: Swelling present.  Skin:    General: Skin is warm.     Findings: No rash.  Neurological:     Mental Status: He is alert and oriented to person, place, and time.     Data Reviewed: Creatinine 1.18, albumin 2.9, white blood cell count 13.2,  hemoglobin 10.3, platelet count 525  Family Communication: Spoke with family at the bedside this morning and came back to speak with daughter at the bedside early afternoon.  Disposition: Status is: Inpatient Remains inpatient appropriate because: Will need rehab and insurance authorization.  Planned Discharge Destination: Rehab    Time spent: 45 minutes Case discussed with [facility]  Author: [name], MD 01/03/2023 3:01 PM  For on call review [email or web address].

## 2023-01-03 NOTE — Progress Notes (Signed)
Peripherally Inserted Central Catheter Placement  The IV Nurse has discussed with the patient and/or persons authorized to consent for the patient, the purpose of this procedure and the potential benefits and risks involved with this procedure.  The benefits include less needle sticks, lab draws from the catheter, and the patient may be discharged home with the catheter. Risks include, but not limited to, infection, bleeding, blood clot (thrombus formation), and puncture of an artery; nerve damage and irregular heartbeat and possibility to perform a PICC exchange if needed/ordered by physician.  Alternatives to this procedure were also discussed.  Bard Power PICC patient education guide, fact sheet on infection prevention and patient information card has been provided to patient /or left at bedside.    PICC Placement Documentation  PICC Single Lumen 01/03/23 Left Basilic 46 cm 0 cm (Active)  Indication for Insertion or Continuance of Line Home intravenous therapies (PICC only) 01/03/23 0950  Exposed Catheter (cm) 0 cm 01/03/23 0950  Site Assessment Clean, Dry, Intact 01/03/23 0950  Line Status Flushed;Saline locked;Blood return noted 01/03/23 0950  Dressing Type Transparent;Securing device 01/03/23 0950  Dressing Status Antimicrobial disc in place;Clean, Dry, Intact 01/03/23 0950  Safety Lock Not Applicable 01/03/23 0950  Line Care Connections checked and tightened 01/03/23 0950  Line Adjustment (NICU/IV Team Only) No 01/03/23 0950  Dressing Intervention New dressing;Other (Comment) 01/03/23 0950  Dressing Change Due 01/10/23 01/03/23 0950    Patient's wife, Jamason Peckham, signed PICC consent prior to insertion. Printed and placed ECG site rite image on chart.   Annett Fabian 01/03/2023, 10:12 AM

## 2023-01-03 NOTE — Assessment & Plan Note (Signed)
-   Continue Flomax 

## 2023-01-04 DIAGNOSIS — L89312 Pressure ulcer of right buttock, stage 2: Secondary | ICD-10-CM

## 2023-01-04 DIAGNOSIS — J90 Pleural effusion, not elsewhere classified: Secondary | ICD-10-CM | POA: Diagnosis not present

## 2023-01-04 DIAGNOSIS — K750 Abscess of liver: Secondary | ICD-10-CM | POA: Diagnosis not present

## 2023-01-04 DIAGNOSIS — L89311 Pressure ulcer of right buttock, stage 1: Secondary | ICD-10-CM | POA: Insufficient documentation

## 2023-01-04 DIAGNOSIS — E43 Unspecified severe protein-calorie malnutrition: Secondary | ICD-10-CM | POA: Diagnosis not present

## 2023-01-04 DIAGNOSIS — R627 Adult failure to thrive: Secondary | ICD-10-CM | POA: Diagnosis not present

## 2023-01-04 LAB — BASIC METABOLIC PANEL
Anion gap: 6 (ref 5–15)
BUN: 28 mg/dL — ABNORMAL HIGH (ref 8–23)
CO2: 25 mmol/L (ref 22–32)
Calcium: 7.9 mg/dL — ABNORMAL LOW (ref 8.9–10.3)
Chloride: 106 mmol/L (ref 98–111)
Creatinine, Ser: 0.85 mg/dL (ref 0.61–1.24)
GFR, Estimated: >60 mL/min (ref >60)
Glucose, Bld: 145 mg/dL — ABNORMAL HIGH (ref 70–99)
Potassium: 4.1 mmol/L (ref 3.5–5.1)
Sodium: 137 mmol/L (ref 135–145)

## 2023-01-04 NOTE — Consult Note (Signed)
WOC Nurse Consult Note: Reason for Consult:stage 2 PI to buttocks Wound type:pressure plus moisture Pressure Injury POA: No Measurement:1cm round x 0.1cm Wound ZOX:WRUE, moist Drainage (amount, consistency, odor) serosanguinous, scant Periwound:macerated Dressing procedure/placement/frequency: I have provided Nursing with guidance in the care of this lesion using a daily cleanse followed by placement of an antimicrobial, nonadherent (xeroform) topped with dry gauze and secured with a silicone foam. Bilateral heel pressure redistribution boots are provided. Turning and repositioning is in place, I have added guidance to minimize time in the supine position. A pressure redistribution chair pad is provided for use when OOB in chair here and also post discharge. It is to be sent home with patient.  WOC nursing team will not follow, but will remain available to this patient, the nursing and medical teams.  Please re-consult if needed.  Thank you for inviting [place] to participate in this patient's Plan of Care.  Ladona Mow, MSN, RN, CNS, GNP, Leda Min, [employer], [employer] phone:  [phone]

## 2023-01-04 NOTE — Progress Notes (Signed)
Progress Note   Patient: [name] Young. WUJ:[id number] DOB: 1940-09-23 DOA: 12/28/2022     7 DOS: the patient was seen and examined on 01/04/2023   Brief hospital course: [name]. 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 Showed large right pleural effusion, large liver abscess of 15.8 x8.6 x9.8 cm. Patient is placed on broad-spectrum antibiotics changed to Zosyn.  IR drain performed on 4/19.  Culture came back with E. coli, pansensitive. Consult from ID obtained.  Recommended 4 weeks ceftriaxone, with final dose of 5/16. OPAT placed.  4/24.  Patient's daughter concerned that his urine is dark and wanted IV fluids overnight.  Explained that the patient needs to eat and drink.  Started on Remeron for poor appetite and sleep. 4/25.  Medically stable to go out to rehab once bed and insurance authorization obtained    Assessment and Plan: * Hepatic abscess IR drain of abscess removed 550 mL of purulent fluid.  E. coli growing out of the cultures.  Now on IV Rocephin and will continue Rocephin through 01/25/2023.  Pleural effusion, right 900 mL drawn off underneath the right lung on 4/19.  Protein-calorie malnutrition, severe Continue supplements.  Encouraged to eat.  Will start Remeron at night.  FTT (failure to thrive) in adult Continue supplements.  Decubitus ulcer of right buttock, stage 2 Noted by nursing staff today.  Advised he needs to get out of bed and move with physical therapy and change positions.  Chronic diastolic CHF  (congestive heart failure) No signs of heart failure currently.  Moderate aortic stenosis Continue to monitor as outpatient  Metabolic acidosis Improved.  Reactive thrombocytosis Platelet count 525.  Paroxysmal atrial fibrillation Continue Eliquis  Obesity (BMI 30-39.9) BMI 30.90  Benign prostatic hyperplasia Continue Flomax  GERD (gastroesophageal reflux disease) PPI resumed        Subjective: Patient states he slept a little bit better and ate a little bit better.  Admitted with liver abscess.  Physical Exam: Vitals:   01/03/23 2345 01/04/23 0422 01/04/23 0841 01/04/23 1102  BP: (!) 143/55 (!) 141/53 139/83 (!) 104/55  Pulse: 80 84 (!) 106 (!) 101  Resp: Temp: 98 F (36.7 C) 98 F (36.7 C) 98.3 F (36.8 C) 98 F (36.7 C)  TempSrc: Oral Oral    SpO2: 97% 98% 99% 99%  Weight:  99.1 kg     Physical Exam HENT:     Head: Normocephalic.     Mouth/Throat:     Pharynx: No oropharyngeal exudate.  Eyes:     General: Lids are normal.     Conjunctiva/sclera: Conjunctivae normal.  Cardiovascular:     Rate and Rhythm: Normal rate and regular rhythm.     Heart sounds: Normal heart sounds, S1 normal and S2 normal.  Pulmonary:     Breath sounds: No decreased breath sounds, wheezing, rhonchi or rales.  Abdominal:     Palpations: Abdomen is soft.     Tenderness: There is abdominal tenderness in the right upper quadrant.  Musculoskeletal:     Right lower leg: Swelling present.     Left lower leg: Swelling  present.  Skin:    General: Skin is warm.     Findings: No rash.  Neurological:     Mental Status: He is alert and oriented to person, place, and time.     Data Reviewed: Creatinine 1.18, last hemoglobin 10.3  Family Communication: Spoke with wife at the bedside  Disposition: Status is: Inpatient Remains inpatient appropriate because: Need a rehab bed and insurance authorization.  Medically stable  Planned Discharge Destination:  Rehab    Time spent: 28 minutes  Author: [name], MD 01/04/2023 3:40 PM  For on call review [email or web address].

## 2023-01-04 NOTE — [place] FL2 (Signed)
[place] MEDICAID FL2 LEVEL OF CARE FORM     IDENTIFICATION  Patient Name: [name] Young. Birthdate: 1941-03-27 Sex: male Admission Date (Current Location): 12/28/2022  [facility] and [place] Number:  Chiropodist and Address:  [facility], [address] [place], [place] [address]      Provider Number: [id number]  Attending Physician Name and Address:  [name], MD  Relative Name and Phone Number:  Markell, [place] (Spouse) [phone] (Mobile    Current Level of Care: Hospital Recommended Level of Care: Skilled Nursing Facility Prior Approval Number:    Date Approved/Denied:   PASRR Number: [id number]  Discharge Plan: SNF    Current Diagnoses: Patient Active Problem List   Diagnosis Date Noted   Moderate aortic stenosis 01/02/2023   Chronic diastolic CHF (congestive heart failure) 01/02/2023   Metabolic acidosis 12/30/2022   Reactive thrombocytosis 12/29/2022   Pleural effusion, right 12/29/2022   Malaise 12/28/2022   History of endocarditis 12/28/2022   History of bacteremia 12/28/2022   Hepatic abscess 12/28/2022   Protein-calorie malnutrition, severe 12/28/2022   Abnormal TSH 12/27/2022   Irregular heart rate 12/26/2022   Recurrent right pleural effusion 12/21/2022   Persistent cough for 3 weeks or longer 10/03/2022   Pre-op evaluation 05/03/2022   Liver mass, right lobe 05/03/2022   CCC (chronic calculous cholecystitis) 04/25/2022   History of acute cholangitis 04/25/2022   Hoarseness 12/22/2021   Infective endocarditis of mitral valve 11/12/2021   Palliative care by specialist    Bacteremia due to Escherichia coli 10/24/2021   Bacteremia due to Streptococcus 10/24/2021   Vitamin B12 deficiency 05/24/2020   Vitamin D deficiency 05/24/2020   Early dry stage nonexudative age-related macular degeneration of both eyes 12/01/2019   Pain due to onychomycosis of toenails of both feet 07/28/2019   Diabetic  neuropathy 07/28/2019   Hearing loss, right 05/22/2019   Paroxysmal atrial fibrillation 02/08/2018   FTT (failure to thrive) in adult 01/28/2018   Anemia    S/P CABG x 3 01/18/2018   Coronary artery disease 12/29/2017   Chronic inactive rheumatic heart disease 11/24/2017   SOB (shortness of breath) 11/05/2017   DNR (do not resuscitate) 06/14/2017   Peripheral neuropathy 06/14/2017   Tinea cruris 04/02/2017   Health maintenance examination 12/12/2016   Obesity (BMI 30-39.9) 12/12/2016   Advanced care planning/counseling discussion 10/30/2014   Benign prostatic hyperplasia 10/30/2014   CKD stage 3 due to type 2 diabetes mellitus 07/08/2013   Tinea corporis 01/16/2013   Medicare annual wellness visit, subsequent 10/11/2011   Type 2 diabetes mellitus with other specified complication    Hyperlipidemia associated with type 2 diabetes mellitus    History of kidney stones    GERD (gastroesophageal reflux disease)     Orientation RESPIRATION BLADDER Height & Weight     Self, Time, Situation, Place  Normal External catheter Weight: 99.1 kg Height:     BEHAVIORAL SYMPTOMS/MOOD NEUROLOGICAL BOWEL NUTRITION STATUS  Other (Comment) (n/a)  (n/a) Incontinent Diet  AMBULATORY STATUS COMMUNICATION OF NEEDS Skin   Limited Assist Verbally Normal                       Personal Care Assistance Level of Assistance  Bathing, Dressing Bathing Assistance: Limited assistance   Dressing Assistance: Limited assistance     Functional Limitations Info  Hearing   Hearing Info: Impaired      SPECIAL CARE FACTORS FREQUENCY  PT (By licensed PT), OT (By licensed  OT)     PT Frequency: Min 2x weekly OT Frequency: Min 2x weekly            Contractures Contractures Info: Not present    Additional Factors Info  Code Status, Allergies Code Status Info: DNR Allergies Info: No Known Allergies           Current Medications (01/04/2023):  This is the current hospital active medication  list Current Facility-Administered Medications  Medication Dose Route Frequency Provider Last Rate Last Admin   0.9 %  sodium chloride infusion   Intravenous Continuous [name], MD 50 mL/hr at 01/04/23 1033 New Bag at 01/04/23 1033   acetaminophen (TYLENOL) tablet 325 mg  325 mg Oral QHS PRN Marrion Coy, MD   325 mg at 01/03/23 2117   And   diphenhydrAMINE (BENADRYL) capsule 25 mg  25 mg Oral QHS PRN Marrion Coy, MD   25 mg at 01/03/23 2117   apixaban (ELIQUIS) tablet 5 mg  5 mg Oral BID Marrion Coy, MD   5 mg at 01/04/23 1035   cefTRIAXone (ROCEPHIN) 2 g in sodium chloride 0.9 % 100 mL IVPB  2 g Intravenous Q24H Lynn Ito, MD 200 mL/hr at 01/03/23 1641 2 g at 01/03/23 1641   Chlorhexidine Gluconate Cloth 2 % PADS 6 each  6 each Topical Daily [name], MD   6 each at 01/04/23 1035   cholecalciferol (VITAMIN D3) 25 MCG (1000 UNIT) tablet 1,000 Units  1,000 Units Oral Daily Cox, Amy N, DO   1,000 Units at 01/03/23 1054   cyanocobalamin (VITAMIN B12) tablet 1,000 mcg  1,000 mcg Oral Daily Marrion Coy, MD   1,000 mcg at 01/04/23 1034   feeding supplement (ENSURE ENLIVE / ENSURE PLUS) liquid 237 mL  237 mL Oral BID BM Marrion Coy, MD   237 mL at 01/04/23 1035   mirtazapine (REMERON) tablet 7.5 mg  7.5 mg Oral QHS [name], MD   7.5 mg at 01/03/23 2117   nitroGLYCERIN (NITROSTAT) SL tablet 0.4 mg  0.4 mg Sublingual Q5 min PRN Cox, Amy N, DO       oxyCODONE-acetaminophen (PERCOCET/ROXICET) 5-325 MG per tablet 1-2 tablet  1-2 tablet Oral Q6H PRN Richarda Overlie, MD   2 tablet at 01/02/23 1045   pantoprazole (PROTONIX) EC tablet 80 mg  80 mg Oral Daily Cox, Amy N, DO   80 mg at 01/04/23 1035   rosuvastatin (CRESTOR) tablet 10 mg  10 mg Oral Daily Marrion Coy, MD   10 mg at 01/04/23 1034   senna-docusate (Senokot-S) tablet 1 tablet  1 tablet Oral QHS PRN Cox, Amy N, DO       sodium chloride flush (NS) 0.9 % injection 10-40 mL  10-40 mL Intracatheter Q12H Wieting,  Richard, MD   10 mL at 01/04/23 1035   sodium chloride flush (NS) 0.9 % injection 10-40 mL  10-40 mL Intracatheter PRN Wieting, Richard, MD       sodium chloride flush (NS) 0.9 % injection 5 mL  5 mL Intracatheter Q8H Richarda Overlie, MD   5 mL at 01/03/23 2118   tamsulosin (FLOMAX) capsule 0.4 mg  0.4 mg Oral Daily Marrion Coy, MD   0.4 mg at 01/04/23 1035     Discharge Medications: Please see discharge summary for a list of discharge medications.  Relevant Imaging Results:  Relevant Lab Results:   Additional Information SSN: [id number]  Truddie Hidden, RN

## 2023-01-04 NOTE — Assessment & Plan Note (Addendum)
Noted by nursing staff yesterday.  Advised he needs to get out of bed and move with physical therapy and change positions.

## 2023-01-04 NOTE — TOC Progression Note (Addendum)
Transition of Care (TOC) - Progression Note    Patient Details  Name: [name] Young. MRN: [id number] Date of Birth: 1941/06/07  [facility]) CM/SW Contact  Truddie Hidden, RN Phone Number: 01/04/2023, 10:45 AM  Clinical Narrative:   FL2 completed. Bed search started.  3:17pm Spoke with admissions representative from [facility]. Referral still being reviewed.    Bed offered by [facility]. Navi auth started.  Patient's wife updated and is agreeable to bed offer.          Expected Discharge Plan and Services                                               Social Determinants of Health (SDOH) Interventions SDOH Screenings   Food Insecurity: Patient Declined (12/26/2022)  Housing: Low Risk  (12/26/2022)  Transportation Needs: No Transportation Needs (12/26/2022)  Depression (PHQ2-9): Medium Risk (12/26/2022)  Financial Resource Strain: Low Risk  (12/26/2022)  Physical Activity: Unknown (12/26/2022)  Social Connections: Unknown (12/26/2022)  Stress: No Stress Concern Present (12/26/2022)  Tobacco Use: Medium Risk (12/29/2022)    Readmission Risk Interventions     No data to display

## 2023-01-04 NOTE — Progress Notes (Signed)
Physical Therapy Treatment Patient Details Name: [name] Young. MRN: [id number] DOB: Jan 16, 1941 Today's Date: 01/04/2023   History of Present Illness [name] Young is a 82 y/o male with PMH including CABGx3, a-fib, and shortness of breath. Presented to ER secondary to SOB, progressive weakness; admitted for management of hepatic abscess (s/p perihepatic drain) and large R pleural effusion (s/p R thoracentesis)    PT Comments    Pt sleeping on arrival but with help from wife wakes, and though initially hesitant to do much did agree to get up and do some ambulation with cuing.  He did not wish to remain up in the recliner, citing soreness and sleepiness.  Pt was able to ambulate increased distance and cadence today, though still slow and heavily reliant on the walker.  He continues to need assist with bed mobility but did again showed good effort with to/from supine/sit.  Pt's O2 remained high 90s with the effort despite reports of increasing fatigue.  Continue with POC.     Recommendations for follow up therapy are one component of a multi-disciplinary discharge planning process, led by the attending physician.  Recommendations may be updated based on patient status, additional functional criteria and insurance authorization.  Follow Up Recommendations  Can patient physically be transported by private vehicle: No    Assistance Recommended at Discharge Frequent or constant Supervision/Assistance  Patient can return home with the following A lot of help with walking and/or transfers;A lot of help with bathing/dressing/bathroom;Assistance with cooking/housework;Assist for transportation;Help with stairs or ramp for entrance   Equipment Recommendations       Recommendations for Other Services       Precautions / Restrictions Precautions Precautions: Fall Restrictions Weight Bearing Restrictions: No     Mobility  Bed Mobility Overal bed mobility: Needs Assistance Bed Mobility:  Supine to Sit, Sit to Supine     Supine to sit: Min assist Sit to supine: Mod assist (b/l LEs)   General bed mobility comments: Good effort up from and back to supine, very minimal assist to attain sitting but did need direct assist to get LEs back into bed    Transfers Overall transfer level: Needs assistance Equipment used: Rolling walker (2 wheels) Transfers: Sit to/from Stand Sit to Stand: From elevated surface, Min assist           General transfer comment: light tactile cuing to initiate stand and control descent sitting; VCs for technique but able to rise slowly w/o direct assist    Ambulation/Gait Ambulation/Gait assistance: Min guard Gait Distance (Feet): 75 Feet Assistive device: Rolling walker (2 wheels)         General Gait Details: Pt with improved cadence and confidence today, able to get into the hallway and return to room w/o rest break.  O2 remained in the high 90s on room air, HR up to 110s with subjective fatigue.  Pt did not have any LOBs and showed great effort/motivation. Still slow and reliant on walker.   Stairs             Wheelchair Mobility    Modified Rankin (Stroke Patients Only)       Balance Overall balance assessment: Needs assistance Sitting-balance support: No upper extremity supported, Feet supported Sitting balance-Leahy Scale: Good     Standing balance support: Bilateral upper extremity supported, During functional activity Standing balance-Leahy Scale: Fair Standing balance comment: steady static standing with B UE support on RW, increased forward lean with increased fatigue  Cognition Arousal/Alertness: Awake/alert Behavior During Therapy: WFL for tasks assessed/performed Overall Cognitive Status: Within Functional Limits for tasks assessed                                          Exercises      General Comments General comments (skin integrity, edema,  etc.): Pt endorses increased fatigue with increased ambulation effort but motivated to make it back to the room w/o needing to sit.      Pertinent Vitals/Pain Pain Assessment Pain Assessment: Faces Faces Pain Scale: Hurts even more Pain Location: belly and bottom    Home Living                          Prior Function            PT Goals (current goals can now be found in the care plan section) Progress towards PT goals: Progressing toward goals    Frequency    Min 4X/week      PT Plan Current plan remains appropriate    Co-evaluation              AM-PAC PT "6 Clicks" Mobility   Outcome Measure  Help needed turning from your back to your side while in a flat bed without using bedrails?: A Little Help needed moving from lying on your back to sitting on the side of a flat bed without using bedrails?: A Lot Help needed moving to and from a bed to a chair (including a wheelchair)?: A Little Help needed standing up from a chair using your arms (e.g., wheelchair or bedside chair)?: A Little Help needed to walk in hospital room?: A Little Help needed climbing 3-5 steps with a railing? : Total 6 Click Score: 15    End of Session Equipment Utilized During Treatment: Gait belt Activity Tolerance: Patient limited by fatigue;Patient limited by pain Patient left: with chair alarm set;with call bell/phone within reach;with family/visitor present Nurse Communication: Mobility status PT Visit Diagnosis: Muscle weakness (generalized) (M62.81);Difficulty in walking, not elsewhere classified (R26.2)     Time: 1610-9604 PT Time Calculation (min) (ACUTE ONLY): 26 min  Charges:  $Gait Training: 8-22 mins $Therapeutic Activity: 8-22 mins                     Malachi Pro, DPT 01/04/2023, 3:28 PM

## 2023-01-04 NOTE — Care Management Important Message (Signed)
Important Message  Patient Details  Name: [name] Young. MRN: [id number] Date of Birth: 12/24/1940   Medicare Important Message Given:  Yes     [name] 01/04/2023, 1:35 PM

## 2023-01-05 DIAGNOSIS — Z7901 Long term (current) use of anticoagulants: Secondary | ICD-10-CM | POA: Diagnosis not present

## 2023-01-05 DIAGNOSIS — I480 Paroxysmal atrial fibrillation: Secondary | ICD-10-CM | POA: Diagnosis present

## 2023-01-05 DIAGNOSIS — R1011 Right upper quadrant pain: Secondary | ICD-10-CM | POA: Diagnosis not present

## 2023-01-05 DIAGNOSIS — Z79899 Other long term (current) drug therapy: Secondary | ICD-10-CM | POA: Diagnosis not present

## 2023-01-05 DIAGNOSIS — R350 Frequency of micturition: Secondary | ICD-10-CM

## 2023-01-05 DIAGNOSIS — R7301 Impaired fasting glucose: Secondary | ICD-10-CM | POA: Insufficient documentation

## 2023-01-05 DIAGNOSIS — J9811 Atelectasis: Secondary | ICD-10-CM | POA: Diagnosis present

## 2023-01-05 DIAGNOSIS — L89312 Pressure ulcer of right buttock, stage 2: Secondary | ICD-10-CM | POA: Diagnosis present

## 2023-01-05 DIAGNOSIS — Z741 Need for assistance with personal care: Secondary | ICD-10-CM | POA: Diagnosis not present

## 2023-01-05 DIAGNOSIS — Z66 Do not resuscitate: Secondary | ICD-10-CM | POA: Diagnosis present

## 2023-01-05 DIAGNOSIS — M6259 Muscle wasting and atrophy, not elsewhere classified, multiple sites: Secondary | ICD-10-CM | POA: Diagnosis not present

## 2023-01-05 DIAGNOSIS — D75838 Other thrombocytosis: Secondary | ICD-10-CM | POA: Diagnosis not present

## 2023-01-05 DIAGNOSIS — L02211 Cutaneous abscess of abdominal wall: Secondary | ICD-10-CM | POA: Diagnosis not present

## 2023-01-05 DIAGNOSIS — I429 Cardiomyopathy, unspecified: Secondary | ICD-10-CM | POA: Diagnosis present

## 2023-01-05 DIAGNOSIS — J90 Pleural effusion, not elsewhere classified: Secondary | ICD-10-CM | POA: Diagnosis present

## 2023-01-05 DIAGNOSIS — I2510 Atherosclerotic heart disease of native coronary artery without angina pectoris: Secondary | ICD-10-CM | POA: Diagnosis present

## 2023-01-05 DIAGNOSIS — B9620 Unspecified Escherichia coli [E. coli] as the cause of diseases classified elsewhere: Secondary | ICD-10-CM | POA: Diagnosis not present

## 2023-01-05 DIAGNOSIS — B379 Candidiasis, unspecified: Secondary | ICD-10-CM | POA: Diagnosis present

## 2023-01-05 DIAGNOSIS — I700 Atherosclerosis of aorta: Secondary | ICD-10-CM | POA: Diagnosis present

## 2023-01-05 DIAGNOSIS — I77810 Thoracic aortic ectasia: Secondary | ICD-10-CM | POA: Diagnosis present

## 2023-01-05 DIAGNOSIS — R188 Other ascites: Secondary | ICD-10-CM | POA: Diagnosis present

## 2023-01-05 DIAGNOSIS — I959 Hypotension, unspecified: Secondary | ICD-10-CM | POA: Diagnosis not present

## 2023-01-05 DIAGNOSIS — F40240 Claustrophobia: Secondary | ICD-10-CM | POA: Diagnosis present

## 2023-01-05 DIAGNOSIS — K219 Gastro-esophageal reflux disease without esophagitis: Secondary | ICD-10-CM | POA: Diagnosis present

## 2023-01-05 DIAGNOSIS — Z961 Presence of intraocular lens: Secondary | ICD-10-CM | POA: Diagnosis present

## 2023-01-05 DIAGNOSIS — R109 Unspecified abdominal pain: Secondary | ICD-10-CM | POA: Diagnosis not present

## 2023-01-05 DIAGNOSIS — E785 Hyperlipidemia, unspecified: Secondary | ICD-10-CM | POA: Diagnosis present

## 2023-01-05 DIAGNOSIS — N401 Enlarged prostate with lower urinary tract symptoms: Secondary | ICD-10-CM | POA: Diagnosis not present

## 2023-01-05 DIAGNOSIS — E669 Obesity, unspecified: Secondary | ICD-10-CM | POA: Diagnosis present

## 2023-01-05 DIAGNOSIS — R627 Adult failure to thrive: Secondary | ICD-10-CM | POA: Diagnosis not present

## 2023-01-05 DIAGNOSIS — Z7401 Bed confinement status: Secondary | ICD-10-CM | POA: Diagnosis not present

## 2023-01-05 DIAGNOSIS — H9190 Unspecified hearing loss, unspecified ear: Secondary | ICD-10-CM | POA: Diagnosis present

## 2023-01-05 DIAGNOSIS — M1990 Unspecified osteoarthritis, unspecified site: Secondary | ICD-10-CM | POA: Diagnosis present

## 2023-01-05 DIAGNOSIS — K750 Abscess of liver: Secondary | ICD-10-CM | POA: Diagnosis not present

## 2023-01-05 DIAGNOSIS — N400 Enlarged prostate without lower urinary tract symptoms: Secondary | ICD-10-CM | POA: Diagnosis present

## 2023-01-05 DIAGNOSIS — E43 Unspecified severe protein-calorie malnutrition: Secondary | ICD-10-CM | POA: Diagnosis not present

## 2023-01-05 DIAGNOSIS — I5032 Chronic diastolic (congestive) heart failure: Secondary | ICD-10-CM | POA: Diagnosis present

## 2023-01-05 DIAGNOSIS — E1169 Type 2 diabetes mellitus with other specified complication: Secondary | ICD-10-CM | POA: Diagnosis present

## 2023-01-05 DIAGNOSIS — E872 Acidosis, unspecified: Secondary | ICD-10-CM | POA: Diagnosis not present

## 2023-01-05 DIAGNOSIS — R1084 Generalized abdominal pain: Secondary | ICD-10-CM | POA: Diagnosis not present

## 2023-01-05 DIAGNOSIS — I110 Hypertensive heart disease with heart failure: Secondary | ICD-10-CM | POA: Diagnosis present

## 2023-01-05 DIAGNOSIS — M6281 Muscle weakness (generalized): Secondary | ICD-10-CM | POA: Diagnosis not present

## 2023-01-05 LAB — CBC
HCT: 31.4 % — ABNORMAL LOW (ref 39.0–52.0)
Hemoglobin: 9.7 g/dL — ABNORMAL LOW (ref 13.0–17.0)
MCH: 27.1 pg (ref 26.0–34.0)
MCHC: 30.9 g/dL (ref 30.0–36.0)
MCV: 87.7 fL (ref 80.0–100.0)
Platelets: 469 10*3/uL — ABNORMAL HIGH (ref 150–400)
RBC: 3.58 MIL/uL — ABNORMAL LOW (ref 4.22–5.81)
RDW: 15.7 % — ABNORMAL HIGH (ref 11.5–15.5)
WBC: 10.2 10*3/uL (ref 4.0–10.5)
nRBC: 0 % (ref 0.0–0.2)

## 2023-01-05 MED ORDER — OXYCODONE-ACETAMINOPHEN 5-325 MG PO TABS: 1.0000 | ORAL_TABLET | Freq: Four times a day (QID) | ORAL | 0 refills | Status: DC | PRN

## 2023-01-05 MED ORDER — FUROSEMIDE 40 MG PO TABS: 40.0000 mg | ORAL_TABLET | Freq: Every day | ORAL | 0 refills | Status: DC | PRN

## 2023-01-05 MED ORDER — MIRTAZAPINE 7.5 MG PO TABS: 7.5000 mg | ORAL_TABLET | Freq: Every day | ORAL | 0 refills | Status: DC

## 2023-01-05 MED ORDER — ENSURE ENLIVE PO LIQD: 237.0000 mL | Freq: Two times a day (BID) | ORAL | 0 refills | Status: DC

## 2023-01-05 MED ORDER — DIPHENHYDRAMINE HCL 25 MG PO CAPS: 25.0000 mg | ORAL_CAPSULE | Freq: Every evening | ORAL | 0 refills | Status: DC | PRN

## 2023-01-05 MED ORDER — ENTRESTO 24-26 MG PO TABS: 1.0000 | ORAL_TABLET | Freq: Two times a day (BID) | ORAL | 4 refills | Status: DC

## 2023-01-05 MED ORDER — CEFTRIAXONE IV (FOR PTA / DISCHARGE USE ONLY)
2.0000 g | INTRAVENOUS | 0 refills | Status: DC
Start: 2023-01-06 — End: 2023-01-12

## 2023-01-05 MED ORDER — PANTOPRAZOLE SODIUM 40 MG PO TBEC: 40.0000 mg | DELAYED_RELEASE_TABLET | Freq: Every day | ORAL | 0 refills | Status: DC

## 2023-01-05 MED ORDER — ACETAMINOPHEN 325 MG PO TABS: 325.0000 mg | ORAL_TABLET | Freq: Every evening | ORAL | Status: DC | PRN

## 2023-01-05 MED ORDER — SENNOSIDES-DOCUSATE SODIUM 8.6-50 MG PO TABS: 1.0000 | ORAL_TABLET | Freq: Every evening | ORAL | 0 refills | Status: DC | PRN

## 2023-01-05 MED ORDER — SODIUM CHLORIDE 0.9% FLUSH: 5.0000 mL | Freq: Three times a day (TID) | INTRAVENOUS | 0 refills | Status: DC

## 2023-01-05 NOTE — TOC Transition Note (Signed)
Transition of Care [facility]) - CM/SW Discharge Note   Patient Details  Name: [name] Young. MRN: [id number] Date of Birth: 1941-06-04  Transition of Care [facility]) CM/SW Contact:  Truddie Hidden, RN Phone Number: 01/05/2023, 1:30 PM   Clinical Narrative:    Sherron Monday with [name] in admissions. Per facility patient admissions confirm for today. Patient assigned room # 706 Nurse will call report to [phone] Nurse, and family notified spoke with spouse Face sheet and medical necessity forms printed to the floor to be added to the EMS pack EMS arranged  Discharge summary and SNF tranfer report sent in HUB.  TOC signing off.          Patient Goals and CMS Choice      Discharge Placement                         Discharge Plan and Services Additional resources added to the After Visit Summary for                                       Social Determinants of Health (SDOH) Interventions SDOH Screenings   Food Insecurity: Patient Declined (12/26/2022)  Housing: Low Risk  (12/26/2022)  Transportation Needs: No Transportation Needs (12/26/2022)  Depression (PHQ2-9): Medium Risk (12/26/2022)  Financial Resource Strain: Low Risk  (12/26/2022)  Physical Activity: Unknown (12/26/2022)  Social Connections: Unknown (12/26/2022)  Stress: No Stress Concern Present (12/26/2022)  Tobacco Use: Medium Risk (12/29/2022)     Readmission Risk Interventions     No data to display

## 2023-01-05 NOTE — Discharge Summary (Addendum)
Physician Discharge Summary   Patient: [name] Young. MRN: [id number] DOB: [birth year]  Admit date:     12/28/2022  Discharge date: 01/05/23  Discharge Physician: [name]   PCP: [name], MD   Recommendations at discharge:   Follow-up team at rehab Follow-up with interventional radiology team for drain. Follow-up Dr End cardiology 2 weeks Follow-up Dr. Joylene Draft infectious disease 2 weeks.  Discharge Diagnoses: Principal Problem:   Hepatic abscess Active Problems:   Pleural effusion, right   Protein-calorie malnutrition, severe (HCC)   FTT (failure to thrive) in adult   GERD (gastroesophageal reflux disease)   Benign prostatic hyperplasia   Obesity (BMI 30-39.9)   DNR (do not resuscitate)   S/P CABG x 3   Paroxysmal atrial fibrillation (HCC)   History of endocarditis   History of bacteremia   Reactive thrombocytosis   Metabolic acidosis   Moderate aortic stenosis   Chronic diastolic CHF (congestive heart failure) (HCC)   Decubitus ulcer of right buttock, stage 2 (HCC)   Impaired fasting glucose    Hospital Course: [name] Young, [name] Young. 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 Showed large right pleural effusion, large liver abscess of 15.8 x8.6 x9.8 cm. Patient is placed on broad-spectrum antibiotics changed to Zosyn.  IR drain performed on 4/19.  Culture came back with E. coli, pansensitive. Consult from ID obtained.  Recommended 4 weeks ceftriaxone, with final dose of 5/16. OPAT placed.  4/24.  Patient's  daughter concerned that his urine is dark and wanted IV fluids overnight.  Explained that the patient needs to eat and drink.  Started on Remeron for poor appetite and sleep. 4/25.  Medically stable to go out to rehab once bed and insurance authorization obtained 4/26.  Received insurance authorization to go out to rehab.  Will restart Marcelline Deist and Casar as outpatient.  Holding parameters on Entresto for systolic blood pressure less than 100.  Lasix only as needed for shortness of breath.    Assessment and Plan: * Hepatic abscess IR drain of abscess removed 550 mL of purulent fluid.  E. coli growing out of the cultures.  Now on IV Rocephin and will continue Rocephin through 01/25/2023.  Remove PICC line after completion of antibiotics.  CBC and BMP weekly while on antibiotics.  Interventional radiology to monitor the drain.  Follow-up with interventional radiology team in 10 to 14 days.  Staff at the rehab to empty the drain every eight hours.  Flush with Picc  with 5 mL normal saline every 8 hours and as needed.  Pleural effusion, right 900 mL drawn off underneath the right lung on 4/19.  Protein-calorie malnutrition, severe (HCC) Continue supplements.  Encouraged to eat.  Continue Remeron at night.  FTT (failure to thrive) in adult Continue supplements.  Impaired fasting glucose Last hemoglobin A1c 5.8  Decubitus ulcer of right buttock, stage 2 (HCC) Noted by nursing staff yesterday.  Advised he needs to get out of bed and move with physical therapy and change positions.  Chronic diastolic CHF (congestive heart failure) (HCC) No signs of heart failure currently.  Will restart Rande Brunt as outpatient.  Lasix only  as needed for shortness of breath or weight gain of 3 pounds in a day or 5 pounds in a week.  Hold Entresto for systolic blood pressure less than 100.  Hold Toprol for now.  Patient had low blood pressure upon coming into the hospital.  Last ejection fraction 50 to 55%  (last year did have an EF of 40 to 45%).  Daily weights.  Moderate aortic stenosis Continue to monitor as outpatient  Metabolic acidosis Improved.  Reactive thrombocytosis Platelet count 469.  Paroxysmal atrial fibrillation (HCC) Continue Eliquis  Obesity (BMI 30-39.9) BMI 30.90  Benign prostatic hyperplasia Continue Flomax  GERD (gastroesophageal reflux disease) PPI resumed   Patient is a DNR.      Consultants: Interventional radiology, infectious disease Procedures performed: Interventional radiology drain placement for liver abscess Disposition: Rehabilitation facility Diet recommendation:  Regular diet with this poor appetite. DISCHARGE MEDICATION: Allergies as of 01/05/2023   No Known Allergies      Medication List     STOP taking these medications    Accu-Chek Aviva Plus test strip Generic drug: glucose blood   blood glucose meter kit and supplies   fenofibrate 145 MG tablet Commonly known as: TRICOR   Fish Oil 1200 MG Caps   metFORMIN 500 MG tablet Commonly known as: GLUCOPHAGE   metoprolol succinate 50 MG 24 hr tablet Commonly known as: TOPROL-XL       TAKE these medications    acetaminophen 325 MG tablet Commonly known as: TYLENOL Take 1 tablet (325 mg total) by mouth at bedtime as needed (sleep aid).   B-12 1000 MCG Caps Take by mouth daily at 2 am.   cefTRIAXone  IVPB Commonly known as: ROCEPHIN Inject 2 g into the vein daily for 19 days. Indication:  E coli liver abscess Last Day of Therapy:  01/25/2023 Labs - Once weekly on Mondays:  CBC/D and CMP Please pull PIC at completion of IV antibiotics Fax weekly lab results  promptly to [phone] Method of administration: IV Push Method of administration may be changed at the discretion of facility/pharmacy Start taking on: January 06, 2023   diphenhydrAMINE 25 mg capsule Commonly known as: BENADRYL Take 1 capsule (25 mg total) by mouth at bedtime as needed for sleep.    Eliquis 5 MG Tabs tablet Generic drug: apixaban TAKE 1 TABLET BY MOUTH TWICE A DAY   Entresto 24-26 MG Generic drug: sacubitril-valsartan Take 1 tablet by mouth 2 (two) times daily. Hold for SBP less than 100 What changed: additional instructions   Farxiga 5 MG Tabs tablet Generic drug: dapagliflozin propanediol Take 5 mg by mouth daily.   feeding supplement Liqd Take 237 mLs by mouth 2 (two) times daily between meals.   furosemide 40 MG tablet Commonly known as: LASIX Take 1 tablet (40 mg total) by mouth daily as needed (Shortness of breath, or weight gain of 3 pounds in a day or 5 pounds in a week.). What changed: reasons to take this   MACULAR VITAMIN BENEFIT PO Take 1 tablet by mouth daily.   mirtazapine 7.5 MG tablet Commonly known as: REMERON Take 1 tablet (7.5 mg total) by mouth at bedtime.   nitroGLYCERIN 0.4 MG SL tablet Commonly known as: Nitrostat Place 1 tablet (0.4 mg total) under the tongue every 5 (five) minutes as needed for chest pain.   oxyCODONE-acetaminophen 5-325 MG tablet Commonly known as: PERCOCET/ROXICET Take 1 tablet by mouth every 6 (six) hours as needed for moderate pain.   pantoprazole 40  MG tablet Commonly known as: PROTONIX Take 1 tablet (40 mg total) by mouth daily. Start taking on: January 06, 2023   rosuvastatin 10 MG tablet Commonly known as: CRESTOR TAKE 1 TABLET BY MOUTH EVERY DAY   senna-docusate 8.6-50 MG tablet Commonly known as: Senokot-S Take 1 tablet by mouth at bedtime as needed for mild constipation.   sodium chloride flush 0.9 % Soln Commonly known as: NS 5 mLs by Intracatheter route every 8 (eight) hours.   tamsulosin 0.4 MG Caps capsule Commonly known as: FLOMAX TAKE 1 CAPSULE BY MOUTH EVERY DAY   Vitamin D3 25 MCG (1000 UT) Caps Take 1 capsule (1,000 Units total) by mouth daily.               Home Infusion Instuctions  (From admission, onward)           Start     Ordered   01/05/23 0000  Home  infusion instructions       Question:  Instructions  Answer:  Flushing of vascular access device: 0.9% NaCl pre/post medication administration and prn patency; Heparin 100 u/ml, 5ml for implanted ports and Heparin 10u/ml, 5ml for all other central venous catheters.   01/05/23 1236            Follow-up Information     [facility] Follow up.   Why: Please follow up with [facility] in 10-14 days. A scheduler from our office will call you with a date/time of your appointment. Please call our office with any questions/concerns prior to your visit. Contact information: [address] [place] [address] [phone]         Lynn Ito, MD Follow up in 2 week(s).   Specialty: Infectious Diseases Contact information: [address] [place] [address] [phone]         Yvonne Kendall, MD Follow up in 2 week(s).   Specialty: Cardiology Contact information: [address] [place] [address] [phone]                Discharge Exam: Ceasar Mons Weights   01/01/23 0330 01/03/23 0424 01/04/23 0422  Weight: 97.5 kg 97.2 kg 99.1 kg   Physical Exam HENT:     Head: Normocephalic.     Mouth/Throat:     Pharynx: No oropharyngeal exudate.  Eyes:     General: Lids are normal.     Conjunctiva/sclera: Conjunctivae normal.  Cardiovascular:     Rate and Rhythm: Normal rate and regular rhythm.     Heart sounds: Normal heart sounds, S1 normal and S2 normal.  Pulmonary:     Breath sounds: Examination of the right-lower field reveals decreased breath sounds. Decreased breath sounds present. No wheezing, rhonchi or rales.  Abdominal:     Palpations: Abdomen is soft.     Tenderness: There is abdominal tenderness in the right upper quadrant.  Musculoskeletal:     Right lower leg: Swelling present.     Left lower leg: Swelling present.  Skin:    General: Skin is warm.     Findings: No rash.   Neurological:     Mental Status: He is alert and oriented to person, place, and time.      Condition at discharge: stable  The results of significant diagnostics from this hospitalization (including imaging, microbiology, ancillary and laboratory) are listed below for reference.   Imaging Studies: [place] EKG SITE RITE  Result Date: 01/02/2023 If [facility] image not attached, placement  could not be confirmed due to current cardiac rhythm.  DG Chest 2 View  Result Date: 01/01/2023 CLINICAL DATA:  Pleural effusion RIGHT EXAM: CHEST - 2 VIEW COMPARISON:  12/29/2022 FINDINGS: Upper normal size of cardiac silhouette post CABG. Mediastinal contours and pulmonary vascularity normal. Atherosclerotic calcification aorta. Bibasilar atelectasis and trace pleural effusions. Upper lungs clear. No pneumothorax or acute osseous findings. IMPRESSION: Bibasilar atelectasis and trace pleural effusions. Aortic Atherosclerosis (ICD10-I70.0). Electronically Signed   By: Ulyses Southward M.D.   On: 01/01/2023 10:51   ECHOCARDIOGRAM COMPLETE  Result Date: 12/30/2022    ECHOCARDIOGRAM REPORT   Patient Name:   [name] Young. Date of Exam: 12/30/2022 Medical Rec #:  [id number]            Height:       70.5 in Accession #:    [id number]           Weight:       217.0 lb Date of Birth:  February 24, 1941           BSA:          2.172 m Patient Age:    81 years             BP:           111/59 mmHg Patient Gender: M                    HR:           88 bpm. Exam Location:  [facility] Procedure: 2D Echo Indications:     Dyspnea R06.00                  Endocarditis I38  History:         Patient has prior history of Echocardiogram examinations, most                  recent 01/11/2022.  Sonographer:     Overton Mam RDCS, FASE Referring Phys:  [id number] AMY N COX Diagnosing Phys: Debbe Odea MD  Sonographer Comments: Technically difficult study due to poor echo windows and no subcostal window. Image acquisition challenging due to  respiratory motion. IMPRESSIONS  1. Left ventricular ejection fraction, by estimation, is 50 to 55%. Left ventricular ejection fraction by PLAX is 50 %. The left ventricle has low normal function. The left ventricle has no regional wall motion abnormalities. Left ventricular diastolic parameters are consistent with Grade I diastolic dysfunction (impaired relaxation).  2. Right ventricular systolic function is normal. The right ventricular size is normal.  3. The mitral valve is degenerative. Mild mitral valve regurgitation. Moderate mitral annular calcification.  4. Aortic valve mean gradient 19 mmHg, peak gradient 35 mmHg, vmax 2.79m/s. The aortic valve is calcified. Aortic valve regurgitation is mild. Mild to moderate aortic valve stenosis.  5. Aortic dilatation noted. There is mild dilatation of the aortic root, measuring 40 mm. FINDINGS  Left Ventricle: Left ventricular ejection fraction, by estimation, is 50 to 55%. Left ventricular ejection fraction by PLAX is 50 %. The left ventricle has low normal function. The left ventricle has no regional wall motion abnormalities. The left ventricular internal cavity size was normal in size. There is no left ventricular hypertrophy. Left ventricular diastolic parameters are consistent with Grade I diastolic dysfunction (impaired relaxation). Right Ventricle: The right ventricular size is normal. No increase in right ventricular wall thickness. Right ventricular systolic function is normal. Left Atrium: Left atrial size was normal in size. Right  Atrium: Right atrial size was normal in size. Pericardium: There is no evidence of pericardial effusion. Mitral Valve: The mitral valve is degenerative in appearance. Moderate mitral annular calcification. Mild mitral valve regurgitation. MV peak gradient, 5.7 mmHg. The mean mitral valve gradient is 2.0 mmHg. Tricuspid Valve: The tricuspid valve is grossly normal. Tricuspid valve regurgitation is mild. Aortic Valve: Aortic valve  mean gradient 19 mmHg, peak gradient 35 mmHg, vmax 2.5m/s. The aortic valve is calcified. Aortic valve regurgitation is mild. Aortic regurgitation PHT measures 638 msec. Mild to moderate aortic stenosis is present. Aortic valve  mean gradient measures 15.0 mmHg. Aortic valve peak gradient measures 28.0 mmHg. Aortic valve area, by VTI measures 2.83 cm. Pulmonic Valve: The pulmonic valve was normal in structure. Pulmonic valve regurgitation is mild. Aorta: Aortic dilatation noted. There is mild dilatation of the aortic root, measuring 40 mm. Venous: The inferior vena cava was not well visualized. IAS/Shunts: No atrial level shunt detected by color flow Doppler.  LEFT VENTRICLE PLAX 2D LV EF:         Left            Diastology                ventricular     LV e' medial:    3.70 cm/s                ejection        LV E/e' medial:  18.0                fraction by     LV e' lateral:   12.50 cm/s                PLAX is 50      LV E/e' lateral: 5.3                %. LVIDd:         4.70 cm LVIDs:         3.50 cm LV PW:         1.20 cm LV IVS:        1.20 cm LVOT diam:     2.40 cm LV SV:         127 LV SV Index:   58 LVOT Area:     4.52 cm  LV Volumes (MOD) LV vol d, MOD    104.0 ml A4C: LV vol s, MOD    41.3 ml A4C: LV SV MOD A4C:   104.0 ml RIGHT VENTRICLE RV Basal diam:  2.60 cm RV S prime:     15.20 cm/s LEFT ATRIUM             Index        RIGHT ATRIUM           Index LA diam:        3.50 cm 1.61 cm/m   RA Area:     13.20 cm LA Vol (A2C):   35.2 ml 16.20 ml/m  RA Volume:   33.00 ml  15.19 ml/m LA Vol (A4C):   43.7 ml 20.12 ml/m LA Biplane Vol: 41.5 ml 19.10 ml/m  AORTIC VALVE                     PULMONIC VALVE AV Area (Vmax):    2.12 cm      PV Vmax:        1.78 m/s AV Area (Vmean):   2.00 cm  PV Peak grad:   12.7 mmHg AV Area (VTI):     2.83 cm      RVOT Peak grad: 2 mmHg AV Vmax:           264.67 cm/s AV Vmean:          179.333 cm/s AV VTI:            0.448 m AV Peak Grad:      28.0 mmHg AV Mean Grad:       15.0 mmHg LVOT Vmax:         124.00 cm/s LVOT Vmean:        79.200 cm/s LVOT VTI:          0.280 m LVOT/AV VTI ratio: 0.62 AI PHT:            638 msec  AORTA Ao Root diam: 4.00 cm MITRAL VALVE MV Area (PHT): 2.28 cm     SHUNTS MV Area VTI:   4.01 cm     Systemic VTI:  0.28 m MV Peak grad:  5.7 mmHg     Systemic Diam: 2.40 cm MV Mean grad:  2.0 mmHg MV Vmax:       1.19 m/s MV Vmean:      59.3 cm/s MV Decel Time: 333 msec MV E velocity: 66.60 cm/s MV A velocity: 119.00 cm/s MV E/A ratio:  0.56 Debbe Odea MD Electronically signed by Debbe Odea MD Signature Date/Time: 12/30/2022/4:15:35 PM    Final    IR THORACENTESIS ASP PLEURAL SPACE W/IMG GUIDE  Result Date: 12/29/2022 INDICATION: 82 year old with large perihepatic abscess and right pleural effusion. EXAM: 1. Ultrasound-guided placement of drainage catheter in the perihepatic abscess 2. Ultrasound-guided right thoracentesis MEDICATIONS: Moderate sedation ANESTHESIA/SEDATION: Moderate (conscious) sedation was employed during this procedure. A total of Versed 2mg  and fentanyl 50 mcg was administered intravenously at the order of the provider performing the procedure. Total intra-service moderate sedation time: 47 minutes. Patient's level of consciousness and vital signs were monitored continuously by radiology nurse throughout the procedure under the supervision of the provider performing the procedure. COMPLICATIONS: None immediate. PROCEDURE: Informed written consent was obtained from the patient after a thorough discussion of the procedural risks, benefits and alternatives. All questions were addressed. Maximal Sterile Barrier Technique was utilized including caps, mask, sterile gowns, sterile gloves, sterile drape, hand hygiene and skin antiseptic. A timeout was performed prior to the initiation of the procedure. Right upper abdomen was evaluated with ultrasound. The large perihepatic abscess was identified. The right upper abdomen was prepped  with chlorhexidine and sterile field was created. Skin was anesthetized using 1% lidocaine. Small incision was made. Using ultrasound guidance, Yueh catheter was directed into the large perihepatic abscess. Yellow purulent fluid was aspirated. Superstiff Amplatz wire was placed. The tract was dilated to accommodate a 12 [place] multipurpose drain. 550 mL of purulent fluid was removed. Drain was sutured to the skin and attached to a suction bulb. Fluid was sent for culture. The right posterior chest was evaluated with ultrasound. Right pleural fluid was identified with ultrasound. A new sterile tray was opened. Right posterior chest was prepped with chlorhexidine and a sterile field was created. Skin was anesthetized using 1% lidocaine. Small incision was made. Using ultrasound guidance, a Safe-T-Centesis catheter was directed into the right pleural space. Yellow fluid was aspirated. 900 mL of fluid was removed. Catheter was removed and a bandage was placed. FINDINGS: 550 mL of purulent fluid was removed from the perihepatic collection. Perihepatic abscess  appeared to be decompressed at the end of the procedure. 900 mL of yellow fluid was removed from the right pleural space. IMPRESSION: 1. Ultrasound-guided placement of a drainage catheter in the large perihepatic abscess. 2. Ultrasound-guided right thoracentesis. Electronically Signed   By: Richarda Overlie M.D.   On: 12/29/2022 20:00   IR IMAGE GUIDED DRAINAGE BY PERCUTANEOUS CATHETER  Result Date: 12/29/2022 INDICATION: 82 year old with large perihepatic abscess and right pleural effusion. EXAM: 1. Ultrasound-guided placement of drainage catheter in the perihepatic abscess 2. Ultrasound-guided right thoracentesis MEDICATIONS: Moderate sedation ANESTHESIA/SEDATION: Moderate (conscious) sedation was employed during this procedure. A total of Versed 2mg  and fentanyl 50 mcg was administered intravenously at the order of the provider performing the procedure. Total  intra-service moderate sedation time: 47 minutes. Patient's level of consciousness and vital signs were monitored continuously by radiology nurse throughout the procedure under the supervision of the provider performing the procedure. COMPLICATIONS: None immediate. PROCEDURE: Informed written consent was obtained from the patient after a thorough discussion of the procedural risks, benefits and alternatives. All questions were addressed. Maximal Sterile Barrier Technique was utilized including caps, mask, sterile gowns, sterile gloves, sterile drape, hand hygiene and skin antiseptic. A timeout was performed prior to the initiation of the procedure. Right upper abdomen was evaluated with ultrasound. The large perihepatic abscess was identified. The right upper abdomen was prepped with chlorhexidine and sterile field was created. Skin was anesthetized using 1% lidocaine. Small incision was made. Using ultrasound guidance, Yueh catheter was directed into the large perihepatic abscess. Yellow purulent fluid was aspirated. Superstiff Amplatz wire was placed. The tract was dilated to accommodate a 12 [place] multipurpose drain. 550 mL of purulent fluid was removed. Drain was sutured to the skin and attached to a suction bulb. Fluid was sent for culture. The right posterior chest was evaluated with ultrasound. Right pleural fluid was identified with ultrasound. A new sterile tray was opened. Right posterior chest was prepped with chlorhexidine and a sterile field was created. Skin was anesthetized using 1% lidocaine. Small incision was made. Using ultrasound guidance, a Safe-T-Centesis catheter was directed into the right pleural space. Yellow fluid was aspirated. 900 mL of fluid was removed. Catheter was removed and a bandage was placed. FINDINGS: 550 mL of purulent fluid was removed from the perihepatic collection. Perihepatic abscess appeared to be decompressed at the end of the procedure. 900 mL of yellow fluid was  removed from the right pleural space. IMPRESSION: 1. Ultrasound-guided placement of a drainage catheter in the large perihepatic abscess. 2. Ultrasound-guided right thoracentesis. Electronically Signed   By: Richarda Overlie M.D.   On: 12/29/2022 20:00   DG Chest Port 1 View  Result Date: 12/29/2022 CLINICAL DATA:  Post thoracentesis. EXAM: PORTABLE CHEST 1 VIEW COMPARISON:  12/28/2022 FINDINGS: Sternotomy wires unchanged. Lungs are adequately inflated with interval improvement and previously seen right pleural effusion as no residual pleural fluid is identified. No evidence of right-sided pneumothorax. Left lung is clear. Cardiomediastinal silhouette and remainder of the exam is unchanged. IMPRESSION: Interval improvement in previously seen right pleural effusion post thoracentesis. No pneumothorax. Electronically Signed   By: Elberta Fortis M.D.   On: 12/29/2022 16:02   CT Angio Chest PE W and/or Wo Contrast  Result Date: 12/28/2022 CLINICAL DATA:  Headache, short of breath, weakness, history of endocarditis, abdominal pain, nonlocalized right upper quadrant pain EXAM: CT ANGIOGRAPHY CHEST CT ABDOMEN AND PELVIS WITH CONTRAST TECHNIQUE: Multidetector CT imaging of the chest was performed using the standard protocol during  bolus administration of intravenous contrast. Multiplanar CT image reconstructions and MIPs were obtained to evaluate the vascular anatomy. Multidetector CT imaging of the abdomen and pelvis was performed using the standard protocol during bolus administration of intravenous contrast. RADIATION DOSE REDUCTION: This exam was performed according to the departmental dose-optimization program which includes automated exposure control, adjustment of the mA and/or kV according to patient size and/or use of iterative reconstruction technique. CONTRAST:  OMNIPAQUE IOHEXOL 350 MG/ML SOLN COMPARISON:  05/10/2022, 10/23/2021 FINDINGS: CTA CHEST FINDINGS Cardiovascular: This is a technically adequate  evaluation of the pulmonary vasculature. No filling defects or pulmonary emboli. The heart is enlarged, with no evidence of pericardial effusion. There is calcification of the aortic and mitral valves. Normal caliber of the thoracic aorta. No evidence of aneurysm or dissection. Postsurgical changes from prior CABG. Atherosclerosis of the aorta and native coronary vessels. Mediastinum/Nodes: No enlarged mediastinal, hilar, or axillary lymph nodes. Thyroid gland, trachea, and esophagus demonstrate no significant findings. Lungs/Pleura: There is a large right pleural effusion, volume estimated in excess of 1 L. minimal compressive atelectasis of the right lower lobe. No airspace disease or pneumothorax. Minimal mucoid material within the trachea. Otherwise the central airways are patent. Musculoskeletal: No acute or destructive bony lesions. Reconstructed images demonstrate no additional findings. Review of the MIP images confirms the above findings. CT ABDOMEN and PELVIS FINDINGS Hepatobiliary: Since the prior exam, the complex fluid collection along the ventral aspect of the liver has enlarged, measuring 15.8 x 8.6 by 9.8 cm on today's exam. Continued mural thickening with internal septations concerning for subcapsular or Peri hepatic abscess. This is amenable to percutaneous sampling if not previously performed. The remainder of the liver is unremarkable. Gallbladder is surgically absent. No biliary duct dilation. Pancreas: Unremarkable. No pancreatic ductal dilatation or surrounding inflammatory changes. Spleen: Normal in size without focal abnormality. Adrenals/Urinary Tract: Stable bilateral renal cysts do not require specific imaging follow-up. Stable nonobstructing right renal calculi, measuring up to 5 mm. No obstructive uropathy within either kidney. The adrenals and bladder are unremarkable. Stomach/Bowel: No bowel obstruction or ileus. Diffuse colonic diverticulosis without evidence of diverticulitis. No  bowel wall thickening. Vascular/Lymphatic: Aortic atherosclerosis. No enlarged abdominal or pelvic lymph nodes. Reproductive: Mild enlargement of the prostate unchanged. Other: No free fluid or free intraperitoneal gas. Evidence of prior umbilical hernia repair. Stable fat containing bilateral inguinal hernias. No bowel herniation. Musculoskeletal: No acute or destructive bony lesions. Reconstructed images demonstrate no additional findings. Review of the MIP images confirms the above findings. IMPRESSION: Chest: 1. No evidence of pulmonary embolus. 2. Large right pleural effusion, volume estimated in excess of 1 L. 3. Cardiomegaly. 4. Aortic and mitral valve calcifications. 5.  Aortic Atherosclerosis (ICD10-I70.0). Abdomen/pelvis: 1. Enlarging 15.8 x 8.6 x 9.8 cm complex fluid collection along the ventral margin of the liver, increased since prior exam and consistent with enlarging subcapsular or perihepatic abscess. This is amenable to percutaneous aspiration/drainage. 2. Nonobstructing right renal calculi. 3. Diffuse colonic diverticulosis without diverticulitis. 4.  Aortic Atherosclerosis (ICD10-I70.0). Electronically Signed   By: Sharlet Salina M.D.   On: 12/28/2022 14:57   CT ABDOMEN PELVIS W CONTRAST  Result Date: 12/28/2022 CLINICAL DATA:  Headache, short of breath, weakness, history of endocarditis, abdominal pain, nonlocalized right upper quadrant pain EXAM: CT ANGIOGRAPHY CHEST CT ABDOMEN AND PELVIS WITH CONTRAST TECHNIQUE: Multidetector CT imaging of the chest was performed using the standard protocol during bolus administration of intravenous contrast. Multiplanar CT image reconstructions and MIPs were obtained to evaluate  the vascular anatomy. Multidetector CT imaging of the abdomen and pelvis was performed using the standard protocol during bolus administration of intravenous contrast. RADIATION DOSE REDUCTION: This exam was performed according to the departmental dose-optimization program which  includes automated exposure control, adjustment of the mA and/or kV according to patient size and/or use of iterative reconstruction technique. CONTRAST:  OMNIPAQUE IOHEXOL 350 MG/ML SOLN COMPARISON:  05/10/2022, 10/23/2021 FINDINGS: CTA CHEST FINDINGS Cardiovascular: This is a technically adequate evaluation of the pulmonary vasculature. No filling defects or pulmonary emboli. The heart is enlarged, with no evidence of pericardial effusion. There is calcification of the aortic and mitral valves. Normal caliber of the thoracic aorta. No evidence of aneurysm or dissection. Postsurgical changes from prior CABG. Atherosclerosis of the aorta and native coronary vessels. Mediastinum/Nodes: No enlarged mediastinal, hilar, or axillary lymph nodes. Thyroid gland, trachea, and esophagus demonstrate no significant findings. Lungs/Pleura: There is a large right pleural effusion, volume estimated in excess of 1 L. minimal compressive atelectasis of the right lower lobe. No airspace disease or pneumothorax. Minimal mucoid material within the trachea. Otherwise the central airways are patent. Musculoskeletal: No acute or destructive bony lesions. Reconstructed images demonstrate no additional findings. Review of the MIP images confirms the above findings. CT ABDOMEN and PELVIS FINDINGS Hepatobiliary: Since the prior exam, the complex fluid collection along the ventral aspect of the liver has enlarged, measuring 15.8 x 8.6 by 9.8 cm on today's exam. Continued mural thickening with internal septations concerning for subcapsular or Peri hepatic abscess. This is amenable to percutaneous sampling if not previously performed. The remainder of the liver is unremarkable. Gallbladder is surgically absent. No biliary duct dilation. Pancreas: Unremarkable. No pancreatic ductal dilatation or surrounding inflammatory changes. Spleen: Normal in size without focal abnormality. Adrenals/Urinary Tract: Stable bilateral renal cysts do not  require specific imaging follow-up. Stable nonobstructing right renal calculi, measuring up to 5 mm. No obstructive uropathy within either kidney. The adrenals and bladder are unremarkable. Stomach/Bowel: No bowel obstruction or ileus. Diffuse colonic diverticulosis without evidence of diverticulitis. No bowel wall thickening. Vascular/Lymphatic: Aortic atherosclerosis. No enlarged abdominal or pelvic lymph nodes. Reproductive: Mild enlargement of the prostate unchanged. Other: No free fluid or free intraperitoneal gas. Evidence of prior umbilical hernia repair. Stable fat containing bilateral inguinal hernias. No bowel herniation. Musculoskeletal: No acute or destructive bony lesions. Reconstructed images demonstrate no additional findings. Review of the MIP images confirms the above findings. IMPRESSION: Chest: 1. No evidence of pulmonary embolus. 2. Large right pleural effusion, volume estimated in excess of 1 L. 3. Cardiomegaly. 4. Aortic and mitral valve calcifications. 5.  Aortic Atherosclerosis (ICD10-I70.0). Abdomen/pelvis: 1. Enlarging 15.8 x 8.6 x 9.8 cm complex fluid collection along the ventral margin of the liver, increased since prior exam and consistent with enlarging subcapsular or perihepatic abscess. This is amenable to percutaneous aspiration/drainage. 2. Nonobstructing right renal calculi. 3. Diffuse colonic diverticulosis without diverticulitis. 4.  Aortic Atherosclerosis (ICD10-I70.0). Electronically Signed   By: Sharlet Salina M.D.   On: 12/28/2022 14:57   CT HEAD WO CONTRAST ( )  Result Date: 12/28/2022 CLINICAL DATA:  Headache, shortness of breath, weakness, on Eliquis EXAM: CT HEAD WITHOUT CONTRAST TECHNIQUE: Contiguous axial images were obtained from the base of the skull through the vertex without intravenous contrast. RADIATION DOSE REDUCTION: This exam was performed according to the departmental dose-optimization program which includes automated exposure control, adjustment of the  mA and/or kV according to patient size and/or use of iterative reconstruction technique. COMPARISON:  10/23/2021 FINDINGS:  Brain: No evidence of acute infarction, hemorrhage, mass, mass effect, or midline shift. No hydrocephalus or extra-axial fluid collection. Periventricular white matter changes, likely the sequela of chronic small vessel ischemic disease. Cerebral volume is within normal limits for age. Vascular: No hyperdense vessel. Atherosclerotic calcifications in the intracranial carotid and vertebral arteries. Skull: Negative for fracture or focal lesion. Sinuses/Orbits: No acute finding. Status post bilateral lens replacements. Other: The mastoid air cells are well aerated. IMPRESSION: No acute intracranial process. Electronically Signed   By: Wiliam Ke M.D.   On: 12/28/2022 14:46   DG Chest 2 View  Result Date: 12/28/2022 CLINICAL DATA:  SOB EXAM: CHEST - 2 VIEW COMPARISON:  12/26/2022. FINDINGS: Enlarged cardiac silhouette. Small right-sided pleural effusion. No pneumothorax. Calcified aorta. Status post median sternotomy and CABG. IMPRESSION: Enlarged cardiac silhouette.  Small right-sided pleural effusion. Electronically Signed   By: Layla Maw M.D.   On: 12/28/2022 12:36   DG Chest 2 View  Result Date: 12/26/2022 CLINICAL DATA:  Dyspnea. Pleural effusion post thoracentesis last week. EXAM: CHEST - 2 VIEW COMPARISON:  Radiographs 12/22/2022 and 12/21/2022.  CT 10/23/2021. FINDINGS: The heart size and mediastinal contours are stable status post median sternotomy and CABG. Small residual right pleural effusion without significant recurrence. There is no pneumothorax, edema or confluent airspace opacity. The bones appear unchanged. IMPRESSION: Small residual right pleural effusion without significant recurrence. No acute cardiopulmonary process. Electronically Signed   By: Carey Bullocks M.D.   On: 12/26/2022 13:09   US THORACENTESIS ASP PLEURAL SPACE W/IMG GUIDE  Result Date:  12/22/2022 INDICATION: Right pleural effusion EXAM: ULTRASOUND GUIDED RIGHT THORACENTESIS MEDICATIONS: 8 cc 1% lidocaine COMPLICATIONS: None immediate. PROCEDURE: An ultrasound guided thoracentesis was thoroughly discussed with the patient and questions answered. The benefits, risks, alternatives and complications were also discussed. The patient understands and wishes to proceed with the procedure. Written consent was obtained. Ultrasound was performed to localize and mark an adequate pocket of fluid in the right chest. The area was then prepped and draped in the normal sterile fashion. 1% Lidocaine was used for local anesthesia. Under ultrasound guidance a 6 Fr Safe-T-Centesis catheter was introduced. Thoracentesis was performed. The catheter was removed and a dressing applied. FINDINGS: A total of approximately 1.0 L of cloudy yellow fluid was removed. Samples were sent to the laboratory as requested by the clinical team. IMPRESSION: Successful ultrasound guided right thoracentesis yielding 1.0 L of pleural fluid. Follow-up chest x-ray revealed no evidence of pneumothorax. Read by: Mina Marble, PA-C Electronically Signed   By: Judie Petit.  Shick M.D.   On: 12/22/2022 14:45   DG Chest 2 View  Result Date: 12/22/2022 CLINICAL DATA:  82 year old male with a history of right pleural effusion status post thoracentesis in February. Recurrence. EXAM: CHEST - 2 VIEW COMPARISON:  Post thoracentesis portable chest x-ray 10/13/2022 and earlier. FINDINGS: PA and lateral views on 12/21/2022. Previous CABG. Stable cardiac size and mediastinal contours. Visualized tracheal air column is within normal limits. The left lung appears stable and clear. Small to moderate right pleural effusion appears to be layering, and is increased since February. No pneumothorax or pulmonary edema. No acute osseous abnormality identified. Negative visible bowel gas. IMPRESSION: 1. Small to moderate right pleural effusion, progressed since February.  2. No other acute cardiopulmonary abnormality. Electronically Signed   By: Odessa Fleming M.D.   On: 12/22/2022 14:25   DG Chest Port 1 View  Result Date: 12/22/2022 CLINICAL DATA:  82 year old male status post ultrasound guided right side  thoracentesis this afternoon. EXAM: PORTABLE CHEST 1 VIEW COMPARISON:  Ultrasound thoracentesis images 13 38 hours. Two view chest yesterday and earlier. FINDINGS: Portable AP upright view at 1410 hours. Substantially regressed, nearly resolved right pleural effusion. No pneumothorax. Stable cardiac size and mediastinal contours. Prior CABG. The left lung remains clear when allowing portable technique. No acute osseous abnormality identified. Paucity of bowel gas in the visible abdomen. IMPRESSION: Nearly resolved right pleural effusion following thoracentesis. No pneumothorax or other acute cardiopulmonary abnormality. Electronically Signed   By: Odessa Fleming M.D.   On: 12/22/2022 14:23    Microbiology: Results for orders placed or performed during the hospital encounter of 12/28/22  Blood culture (routine x 2)     Status: None   Collection Time: 12/28/22  1:22 PM   Specimen: Left Antecubital; Blood  Result Value Ref Range Status   Specimen Description LEFT ANTECUBITAL  Final   Special Requests   Final    BOTTLES DRAWN AEROBIC AND ANAEROBIC Blood Culture adequate volume   Culture   Final    NO GROWTH 5 DAYS Performed at [facility], [address], [place], [place] [address]    Report Status 01/02/2023 FINAL  Final  Blood culture (routine x 2)     Status: None   Collection Time: 12/28/22  1:43 PM   Specimen: BLOOD LEFT HAND  Result Value Ref Range Status   Specimen Description BLOOD LEFT HAND  Final   Special Requests   Final    BOTTLES DRAWN AEROBIC AND ANAEROBIC Blood Culture adequate volume   Culture   Final    NO GROWTH 5 DAYS Performed at [facility], [address], [place], [place] [address]    Report Status 01/02/2023 FINAL  Final   Aerobic/Anaerobic Culture w Gram Stain (surgical/deep wound)     Status: None   Collection Time: 12/29/22  3:20 PM   Specimen: Abscess  Result Value Ref Range Status   Specimen Description   Final    ABSCESS Performed at [facility], [address] [place], [place] [address]    Special Requests   Final    NONE Performed at [facility], [address] [place], [place] [address]    Gram Stain   Final    MODERATE WBC PRESENT,BOTH PMN AND MONONUCLEAR MODERATE GRAM NEGATIVE RODS    Culture   Final    ABUNDANT ESCHERICHIA COLI NO ANAEROBES ISOLATED Performed at [facility], [address], [place], [place] [address]    Report Status 01/03/2023 FINAL  Final   Organism ID, Bacteria ESCHERICHIA COLI  Final      Susceptibility   Escherichia coli - MIC*    AMPICILLIN <=2 SENSITIVE Sensitive     CEFEPIME <=0.12 SENSITIVE Sensitive     CEFTAZIDIME <=1 SENSITIVE Sensitive     CEFTRIAXONE <=0.25 SENSITIVE Sensitive     CIPROFLOXACIN <=0.25 SENSITIVE Sensitive     GENTAMICIN <=1 SENSITIVE Sensitive     IMIPENEM <=0.25 SENSITIVE Sensitive     TRIMETH/SULFA <=20 SENSITIVE Sensitive     AMPICILLIN/SULBACTAM <=2 SENSITIVE Sensitive     PIP/TAZO <=4 SENSITIVE Sensitive     * ABUNDANT ESCHERICHIA COLI    Labs: CBC: Recent Labs  Lab 12/30/22 0418 12/31/22 0541 01/02/23 0429 01/05/23 0430  WBC 13.9* 12.9* 13.2* 10.2  HGB 10.6* 10.2* 10.3* 9.7*  HCT 33.8* 32.7* 33.3* 31.4*  MCV 87.1 86.5 86.9 87.7  PLT 514* 515* 525* 469*   Basic Metabolic Panel:  Recent Labs  Lab 12/30/22 0418 12/31/22 0541 01/02/23 0429 01/04/23 1600  NA 140 139 140 137  K 4.1 3.9 4.0 4.1  CL 110 109 108 106  CO2 19* 21* 24 25  GLUCOSE 108* 117* 114* 145*  BUN 32* 32* 36* 28*  CREATININE 1.07 1.08 1.18 0.85  CALCIUM 8.3* 8.3* 8.2* 7.9*  MG 1.9 2.0  --   --   PHOS  --  2.8  --   --      Discharge time spent: greater than 30 minutes.  Signed: [name], MD [facility] 01/05/2023

## 2023-01-05 NOTE — Consult Note (Signed)
   [facility] CM Inpatient Consult   01/05/2023  Eivan Gallina [birth year] [id number]   Location: [facility] RN Hospital Liaison screened remotely [facility]).   [facility] Customer service manager 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   Patient screened for readmission hospitalization with noted medium risk score for unplanned readmission risk with 1 IP in 6 months. [facility]/Population Health RN liaison will assess for potential [facility]) Care Management service needs for post hospital transition for care coordination.  [facility] PAC-RN does not follow this insurance plan to SNF post hospitalization.  Plan. Pt discharging to SNF for ongoing therapy.  [facility] Care Management/Population Health does not replace or interfere with any arrangements made by the Inpatient Transition of Care team.   For questions contact:     Elliot Cousin, RN, BSN [facility] Liaison [place]   [facility]  Population Health Office Hours MTWF 8:00 am to 6 pm off on Thursday [phone] mobile [phone] [Office toll free line][facility] Office Hours are M-F 8:30 - 5 pm 24 hour nurse advise line [phone] Conceirge  [email or web address]

## 2023-01-05 NOTE — Assessment & Plan Note (Signed)
Last hemoglobin A1c 5.8

## 2023-01-05 NOTE — Plan of Care (Signed)

## 2023-01-07 ENCOUNTER — Other Ambulatory Visit: Payer: Self-pay

## 2023-01-07 ENCOUNTER — Inpatient Hospital Stay
Admission: EM | Admit: 2023-01-07 | Discharge: 2023-01-12 | DRG: 442 | Disposition: A | Discharge status: Home Health | Payer: Medicare PPO | Source: Skilled Nursing Facility | Attending: Internal Medicine | Admitting: Internal Medicine

## 2023-01-07 ENCOUNTER — Emergency Department: Payer: Medicare PPO

## 2023-01-07 DIAGNOSIS — Z79899 Other long term (current) drug therapy: Secondary | ICD-10-CM

## 2023-01-07 DIAGNOSIS — I429 Cardiomyopathy, unspecified: Secondary | ICD-10-CM | POA: Diagnosis present

## 2023-01-07 DIAGNOSIS — H9190 Unspecified hearing loss, unspecified ear: Secondary | ICD-10-CM | POA: Diagnosis present

## 2023-01-07 DIAGNOSIS — I5032 Chronic diastolic (congestive) heart failure: Secondary | ICD-10-CM | POA: Diagnosis present

## 2023-01-07 DIAGNOSIS — K750 Abscess of liver: Principal | ICD-10-CM | POA: Diagnosis present

## 2023-01-07 DIAGNOSIS — J90 Pleural effusion, not elsewhere classified: Secondary | ICD-10-CM | POA: Diagnosis not present

## 2023-01-07 DIAGNOSIS — Z7901 Long term (current) use of anticoagulants: Secondary | ICD-10-CM | POA: Diagnosis not present

## 2023-01-07 DIAGNOSIS — J9811 Atelectasis: Secondary | ICD-10-CM | POA: Diagnosis present

## 2023-01-07 DIAGNOSIS — F40240 Claustrophobia: Secondary | ICD-10-CM | POA: Diagnosis present

## 2023-01-07 DIAGNOSIS — I77810 Thoracic aortic ectasia: Secondary | ICD-10-CM | POA: Diagnosis present

## 2023-01-07 DIAGNOSIS — N281 Cyst of kidney, acquired: Secondary | ICD-10-CM | POA: Diagnosis not present

## 2023-01-07 DIAGNOSIS — M1990 Unspecified osteoarthritis, unspecified site: Secondary | ICD-10-CM | POA: Diagnosis present

## 2023-01-07 DIAGNOSIS — E1122 Type 2 diabetes mellitus with diabetic chronic kidney disease: Secondary | ICD-10-CM | POA: Diagnosis present

## 2023-01-07 DIAGNOSIS — K651 Peritoneal abscess: Secondary | ICD-10-CM | POA: Diagnosis not present

## 2023-01-07 DIAGNOSIS — E669 Obesity, unspecified: Secondary | ICD-10-CM | POA: Diagnosis present

## 2023-01-07 DIAGNOSIS — B9620 Unspecified Escherichia coli [E. coli] as the cause of diseases classified elsewhere: Secondary | ICD-10-CM | POA: Diagnosis not present

## 2023-01-07 DIAGNOSIS — B379 Candidiasis, unspecified: Secondary | ICD-10-CM | POA: Diagnosis present

## 2023-01-07 DIAGNOSIS — E1169 Type 2 diabetes mellitus with other specified complication: Secondary | ICD-10-CM | POA: Diagnosis present

## 2023-01-07 DIAGNOSIS — R7881 Bacteremia: Secondary | ICD-10-CM | POA: Diagnosis present

## 2023-01-07 DIAGNOSIS — I480 Paroxysmal atrial fibrillation: Secondary | ICD-10-CM | POA: Diagnosis present

## 2023-01-07 DIAGNOSIS — Z961 Presence of intraocular lens: Secondary | ICD-10-CM | POA: Diagnosis present

## 2023-01-07 DIAGNOSIS — I2510 Atherosclerotic heart disease of native coronary artery without angina pectoris: Secondary | ICD-10-CM | POA: Diagnosis present

## 2023-01-07 DIAGNOSIS — D649 Anemia, unspecified: Secondary | ICD-10-CM | POA: Diagnosis present

## 2023-01-07 DIAGNOSIS — L89311 Pressure ulcer of right buttock, stage 1: Secondary | ICD-10-CM | POA: Diagnosis present

## 2023-01-07 DIAGNOSIS — L89312 Pressure ulcer of right buttock, stage 2: Secondary | ICD-10-CM | POA: Diagnosis present

## 2023-01-07 DIAGNOSIS — Z951 Presence of aortocoronary bypass graft: Secondary | ICD-10-CM

## 2023-01-07 DIAGNOSIS — E785 Hyperlipidemia, unspecified: Secondary | ICD-10-CM | POA: Diagnosis present

## 2023-01-07 DIAGNOSIS — Z87891 Personal history of nicotine dependence: Secondary | ICD-10-CM

## 2023-01-07 DIAGNOSIS — N183 Type 2 diabetes mellitus with diabetic chronic kidney disease: Secondary | ICD-10-CM | POA: Diagnosis present

## 2023-01-07 DIAGNOSIS — R1084 Generalized abdominal pain: Secondary | ICD-10-CM | POA: Diagnosis not present

## 2023-01-07 DIAGNOSIS — Z4682 Encounter for fitting and adjustment of non-vascular catheter: Secondary | ICD-10-CM | POA: Diagnosis not present

## 2023-01-07 DIAGNOSIS — K219 Gastro-esophageal reflux disease without esophagitis: Secondary | ICD-10-CM | POA: Diagnosis present

## 2023-01-07 DIAGNOSIS — I110 Hypertensive heart disease with heart failure: Secondary | ICD-10-CM | POA: Diagnosis present

## 2023-01-07 DIAGNOSIS — R10A1 Flank pain, right side: Secondary | ICD-10-CM | POA: Diagnosis present

## 2023-01-07 DIAGNOSIS — N400 Enlarged prostate without lower urinary tract symptoms: Secondary | ICD-10-CM | POA: Diagnosis present

## 2023-01-07 DIAGNOSIS — Z6831 Body mass index (BMI) 31.0-31.9, adult: Secondary | ICD-10-CM

## 2023-01-07 DIAGNOSIS — Z66 Do not resuscitate: Secondary | ICD-10-CM | POA: Diagnosis not present

## 2023-01-07 DIAGNOSIS — R188 Other ascites: Secondary | ICD-10-CM | POA: Diagnosis present

## 2023-01-07 DIAGNOSIS — R1011 Right upper quadrant pain: Secondary | ICD-10-CM | POA: Diagnosis not present

## 2023-01-07 DIAGNOSIS — I700 Atherosclerosis of aorta: Secondary | ICD-10-CM | POA: Diagnosis present

## 2023-01-07 DIAGNOSIS — L02211 Cutaneous abscess of abdominal wall: Secondary | ICD-10-CM | POA: Diagnosis not present

## 2023-01-07 DIAGNOSIS — Z7189 Other specified counseling: Secondary | ICD-10-CM

## 2023-01-07 DIAGNOSIS — R109 Unspecified abdominal pain: Secondary | ICD-10-CM | POA: Diagnosis not present

## 2023-01-07 DIAGNOSIS — N200 Calculus of kidney: Secondary | ICD-10-CM | POA: Diagnosis not present

## 2023-01-07 DIAGNOSIS — Z8249 Family history of ischemic heart disease and other diseases of the circulatory system: Secondary | ICD-10-CM

## 2023-01-07 DIAGNOSIS — K650 Generalized (acute) peritonitis: Principal | ICD-10-CM

## 2023-01-07 LAB — CBC
HCT: 33 % — ABNORMAL LOW (ref 39.0–52.0)
Hemoglobin: 10 g/dL — ABNORMAL LOW (ref 13.0–17.0)
MCH: 27 pg (ref 26.0–34.0)
MCHC: 30.3 g/dL (ref 30.0–36.0)
MCV: 88.9 fL (ref 80.0–100.0)
Platelets: 441 K/uL — ABNORMAL HIGH (ref 150–400)
RBC: 3.71 MIL/uL — ABNORMAL LOW (ref 4.22–5.81)
RDW: 15.8 % — ABNORMAL HIGH (ref 11.5–15.5)
WBC: 7.9 K/uL (ref 4.0–10.5)
nRBC: 0 % (ref 0.0–0.2)

## 2023-01-07 LAB — URINALYSIS, COMPLETE (UACMP) WITH MICROSCOPIC
Bilirubin Urine: NEGATIVE
Glucose, UA: 50 mg/dL — AB
Ketones, ur: NEGATIVE mg/dL
Nitrite: NEGATIVE
Protein, ur: NEGATIVE mg/dL
Specific Gravity, Urine: 1.044 — ABNORMAL HIGH (ref 1.005–1.030)
pH: 5 (ref 5.0–8.0)

## 2023-01-07 LAB — COMPREHENSIVE METABOLIC PANEL WITH GFR
ALT: 42 U/L (ref 0–44)
AST: 75 U/L — ABNORMAL HIGH (ref 15–41)
Albumin: 2.1 g/dL — ABNORMAL LOW (ref 3.5–5.0)
Alkaline Phosphatase: 62 U/L (ref 38–126)
Anion gap: 5 (ref 5–15)
BUN: 21 mg/dL (ref 8–23)
CO2: 27 mmol/L (ref 22–32)
Calcium: 8.1 mg/dL — ABNORMAL LOW (ref 8.9–10.3)
Chloride: 104 mmol/L (ref 98–111)
Creatinine, Ser: 0.95 mg/dL (ref 0.61–1.24)
GFR, Estimated: >60 mL/min
Glucose, Bld: 124 mg/dL — ABNORMAL HIGH (ref 70–99)
Potassium: 4.2 mmol/L (ref 3.5–5.1)
Sodium: 136 mmol/L (ref 135–145)
Total Bilirubin: 0.4 mg/dL (ref 0.3–1.2)
Total Protein: 5.3 g/dL — ABNORMAL LOW (ref 6.5–8.1)

## 2023-01-07 LAB — TYPE AND SCREEN
ABO/RH(D): A POS
Antibody Screen: NEGATIVE

## 2023-01-07 LAB — LACTIC ACID, PLASMA: Lactic Acid, Venous: 1.3 mmol/L (ref 0.5–1.9)

## 2023-01-07 LAB — LIPASE, BLOOD: Lipase: 24 U/L (ref 11–51)

## 2023-01-07 MED ORDER — ACETAMINOPHEN 650 MG RE SUPP: 650.0000 mg | Freq: Four times a day (QID) | RECTAL | Status: DC | PRN

## 2023-01-07 MED ORDER — METRONIDAZOLE 500 MG/100ML IV SOLN
500.0000 mg | Freq: Three times a day (TID) | INTRAVENOUS | Status: DC
  Administered 2023-01-07 – 2023-01-08 (×2): 500 mg via INTRAVENOUS
  Filled 2023-01-07 (×2): qty 100

## 2023-01-07 MED ORDER — SODIUM CHLORIDE 0.9% FLUSH
3.0000 mL | Freq: Two times a day (BID) | INTRAVENOUS | Status: DC
  Administered 2023-01-08 – 2023-01-12 (×6): 3 mL via INTRAVENOUS

## 2023-01-07 MED ORDER — VANCOMYCIN HCL 1500 MG/300ML IV SOLN: 1500.0000 mg | INTRAVENOUS | Status: DC

## 2023-01-07 MED ORDER — HYDROCODONE-ACETAMINOPHEN 5-325 MG PO TABS: 1.0000 | ORAL_TABLET | ORAL | Status: DC | PRN

## 2023-01-07 MED ORDER — INSULIN ASPART 100 UNIT/ML IJ SOLN: 0.0000 [IU] | Freq: Three times a day (TID) | INTRAMUSCULAR | Status: DC

## 2023-01-07 MED ORDER — ASPIRIN 81 MG PO TBEC: 81.0000 mg | DELAYED_RELEASE_TABLET | Freq: Every day | ORAL | Status: DC

## 2023-01-07 MED ORDER — NITROGLYCERIN 0.4 MG SL SUBL: 0.4000 mg | SUBLINGUAL_TABLET | SUBLINGUAL | Status: DC | PRN

## 2023-01-07 MED ORDER — MIRTAZAPINE 15 MG PO TABS
7.5000 mg | ORAL_TABLET | Freq: Every day | ORAL | Status: DC
  Administered 2023-01-07 – 2023-01-11 (×5): 7.5 mg via ORAL
  Filled 2023-01-07 (×5): qty 1

## 2023-01-07 MED ORDER — IOHEXOL 300 MG/ML  SOLN
100.0000 mL | Freq: Once | INTRAMUSCULAR | Status: AC | PRN
  Administered 2023-01-07: 100 mL via INTRAVENOUS

## 2023-01-07 MED ORDER — OXYCODONE-ACETAMINOPHEN 5-325 MG PO TABS
1.0000 | ORAL_TABLET | Freq: Four times a day (QID) | ORAL | Status: DC | PRN
  Administered 2023-01-09 – 2023-01-10 (×2): 1 via ORAL
  Filled 2023-01-07 (×2): qty 1

## 2023-01-07 MED ORDER — VANCOMYCIN HCL 2000 MG/400ML IV SOLN
2000.0000 mg | Freq: Once | INTRAVENOUS | Status: DC
  Filled 2023-01-07: qty 400

## 2023-01-07 MED ORDER — TAMSULOSIN HCL 0.4 MG PO CAPS
0.4000 mg | ORAL_CAPSULE | Freq: Every day | ORAL | Status: DC
  Administered 2023-01-08 – 2023-01-12 (×5): 0.4 mg via ORAL
  Filled 2023-01-07 (×5): qty 1

## 2023-01-07 MED ORDER — ACETAMINOPHEN 325 MG PO TABS: 325.0000 mg | ORAL_TABLET | Freq: Every evening | ORAL | Status: DC | PRN

## 2023-01-07 MED ORDER — DIPHENHYDRAMINE HCL 25 MG PO CAPS
25.0000 mg | ORAL_CAPSULE | Freq: Every evening | ORAL | Status: DC | PRN
  Administered 2023-01-10 – 2023-01-11 (×2): 25 mg via ORAL
  Filled 2023-01-07 (×2): qty 1

## 2023-01-07 MED ORDER — HYDROMORPHONE HCL 1 MG/ML IJ SOLN: 1.0000 mg | Freq: Three times a day (TID) | INTRAMUSCULAR | Status: DC | PRN

## 2023-01-07 MED ORDER — HYDRALAZINE HCL 20 MG/ML IJ SOLN: 5.0000 mg | INTRAMUSCULAR | Status: DC | PRN

## 2023-01-07 MED ORDER — ACETAMINOPHEN 325 MG PO TABS: 650.0000 mg | ORAL_TABLET | Freq: Four times a day (QID) | ORAL | Status: DC | PRN

## 2023-01-07 MED ORDER — VANCOMYCIN HCL 500 MG/100ML IV SOLN
500.0000 mg | Freq: Once | INTRAVENOUS | Status: AC
  Administered 2023-01-07: 500 mg via INTRAVENOUS
  Filled 2023-01-07: qty 100

## 2023-01-07 MED ORDER — HYDROMORPHONE HCL 1 MG/ML IJ SOLN
0.5000 mg | Freq: Once | INTRAMUSCULAR | Status: AC
  Administered 2023-01-07: 0.5 mg via INTRAVENOUS
  Filled 2023-01-07: qty 0.5

## 2023-01-07 MED ORDER — ROSUVASTATIN CALCIUM 10 MG PO TABS
10.0000 mg | ORAL_TABLET | Freq: Every day | ORAL | Status: DC
  Administered 2023-01-08 – 2023-01-12 (×5): 10 mg via ORAL
  Filled 2023-01-07 (×5): qty 1

## 2023-01-07 MED ORDER — PIPERACILLIN-TAZOBACTAM 3.375 G IVPB
3.3750 g | Freq: Three times a day (TID) | INTRAVENOUS | Status: DC
  Administered 2023-01-08 – 2023-01-11 (×11): 3.375 g via INTRAVENOUS
  Filled 2023-01-07 (×12): qty 50

## 2023-01-07 MED ORDER — VANCOMYCIN HCL 2000 MG/400ML IV SOLN
2000.0000 mg | Freq: Once | INTRAVENOUS | Status: AC
  Administered 2023-01-07: 2000 mg via INTRAVENOUS
  Filled 2023-01-07: qty 400

## 2023-01-07 MED ORDER — MORPHINE SULFATE (PF) 2 MG/ML IV SOLN: 2.0000 mg | INTRAVENOUS | Status: DC | PRN

## 2023-01-07 MED ORDER — FLUCONAZOLE IN SODIUM CHLORIDE 200-0.9 MG/100ML-% IV SOLN
200.0000 mg | INTRAVENOUS | Status: AC
  Administered 2023-01-07 – 2023-01-08 (×2): 200 mg via INTRAVENOUS
  Filled 2023-01-07 (×2): qty 100

## 2023-01-07 MED ORDER — PANTOPRAZOLE SODIUM 40 MG IV SOLR
40.0000 mg | Freq: Two times a day (BID) | INTRAVENOUS | Status: DC
  Administered 2023-01-07 – 2023-01-08 (×2): 40 mg via INTRAVENOUS
  Filled 2023-01-07 (×2): qty 10

## 2023-01-07 MED ORDER — PIPERACILLIN-TAZOBACTAM 3.375 G IVPB 30 MIN
3.3750 g | Freq: Once | INTRAVENOUS | Status: AC
  Administered 2023-01-07: 3.375 g via INTRAVENOUS
  Filled 2023-01-07: qty 50

## 2023-01-07 NOTE — Progress Notes (Signed)
Pharmacy Antibiotic Note  [name] Young. is a 82 y.o. male admitted on 01/07/2023 with cellulitis.  Pharmacy has been consulted for vancomycin and Zosyn dosing.  Receiving vancomycin 2500 mg loading dose. Renal function consistent with baseline  Plan:.  Vancomycin 1500 mg every 24 hours to begin tomorrow evening Goal AUC 400-550 Estimated AUC 534.7, Cmin 11.3 Used IBW, Vd 0.5  Zosyn (piperacillin/tazobactam) 3.375 grams every 8 hours (4 hour infusion)   Height: 5\' 10"  (177.8 cm) Weight: 97.5 kg (215 lb) IBW/kg (Calculated) : 73  Temp (24hrs), Avg:97.8 F (36.6 C), Min:97.8 F (36.6 C), Max:97.8 F (36.6 C)  Recent Labs  Lab 01/02/23 0429 01/04/23 1600 01/05/23 0430 01/07/23 1541  WBC 13.2*  --  10.2 7.9  CREATININE 1.18 0.85  --  0.95  LATICACIDVEN  --   --   --  1.3    Estimated Creatinine Clearance: 72.6 mL/min (by C-G formula based on SCr of 0.95 mg/dL).    No Known Allergies  Antimicrobials this admission: vancomycin 4/28 >>  zosyn 4/28 >>   Dose adjustments this admission: N/a  Microbiology results: none  Thank you for allowing pharmacy to be a part of this patient's care.  Elliot Gurney, PharmD, BCPS Clinical Pharmacist  01/07/2023 9:28 PM

## 2023-01-07 NOTE — Assessment & Plan Note (Signed)
Continue eliquis  ?

## 2023-01-07 NOTE — Assessment & Plan Note (Signed)
Stable pt has not chest pain.  We will cont hydralazine.

## 2023-01-07 NOTE — Assessment & Plan Note (Signed)
  We will continue with wound care and iv  abx.

## 2023-01-07 NOTE — Progress Notes (Signed)
PHARMACY -  BRIEF ANTIBIOTIC NOTE   Pharmacy has received consult(s) for vancomycin from an ED provider.  The patient's profile has been reviewed for ht/wt/allergies/indication/available labs.    One time orders placed for vancomycin 2,000 mg followed by 500mg  to complete 2500mg  loading dose  Further antibiotics/pharmacy consults should be ordered by admitting physician if indicated.                       Thank you,  Elliot Gurney, PharmD, BCPS Clinical Pharmacist  01/07/2023 7:13 PM

## 2023-01-07 NOTE — H&P (Signed)
History and Physical     Patient: [name] Young. ONG:[id number] DOB: April 15, 1941 DOA: 01/07/2023 DOS: the patient was seen and examined on 01/08/2023 PCP: [name], MD   Patient coming from: [facility]  Chief Complaint: Right sided abd pain.  HISTORY OF PRESENT ILLNESS: [name] Young. is an 82 y.o. male coming from [facility] with complaints of swelling and pain and rash right side of his abdomen and right flank and he has bloody drain. Daughter at bedside. According to her: Pt in February 2024 right lung thoracentesis and got 1 L. Pt had to have repeat thoracentesis in March 2024 . Pt then had another thoracentesis in April 2024. The on 18th of April pt was admitted for SOB and weakness. Pt sees Dr.END cardiology - [place]. During his admission in April he had thoracentesis and IR Drainage of liver abscess. D/w family  about holding farxiga as pt had yeast infection.  Pt d/c home with rocephin two days ago coming back today with complaints of right sided abd pain and skin changes overlying RUQ. And also right flanks. No fever/ no nausea / vomiting. Initial vitals : 122/74 BP 73 HR  94% on RA 16 RR 142 CBG  Past Medical History:  Diagnosis Date   Adenomatous colon polyp    Aortic atherosclerosis (HCC)    Arthritis    Ascending aorta dilatation (HCC)    a.) TTE 11/23/2017: asc Ao measured 37 mm; b.) TTE 10/23/2021: Ao root measured 41 mm, asc Ao measured 38 mm; c.) TEE 10/28/2021: asc Ao measured 38 mm; d.) TTE 01/11/2022: Ao root 40 mm, asc Ao 39 mm   Ascending cholangitis 10/2021   BPH (benign prostatic hyperplasia)    Cardiomyopathy (HCC)    CKD (chronic kidney disease), stage III (HCC)    Claustrophobia    Coronary artery disease    a.) LHC 12/21/2017: 75% mLAD, 90% D1, 80-99% OM1/2, CTO pRCA (L-R collaterals) --> CVTS consult. 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   In setting of  bacteremia from ascending colangitis   GERD (gastroesophageal reflux disease)    HFrEF (heart failure with reduced ejection fraction) (HCC)    a.) TTE 11/23/2017: EF 50-55%, mod MAC, triv TR, G1DD; b.) TTE 10/23/2021: EF 40-45%, mild LAE, Ao sclerosis, triv MR, G2DD; c.) TEE 10/28/2021: EF 40-45%, glob HK, mobile vegitation on MV; d.) TTE 01/11/2022: EF 50-55%, mild LVH, RVE, Ao sclerosis, mild MR/AR, G1DD   History of cholelithiasis    History of kidney stones    ca ox Vonita Moss @ [facility]) now [employer]   History of pneumonia    HLD (hyperlipidemia)    HTN (hypertension)    Jaundice    age 47   Long term current use of anticoagulant    a.) apixaban   Paroxysmal atrial fibrillation (HCC)    a.) CHA2DS2VASc = 6 (age x 2, HFrEF, HTN, vascular disease history, T2DM);  b.) rate/rhythm maintained on oral metoprolol succinate; chronically anticoagulated with apixaban   Pneumonia    Right-sided carotid artery disease (HCC)    a.) carotid doppler 04/05/2022: 1-39% RICA   S/P CABG x 3    a.) LIMA-LAD, SVG-diagonal, SVG-PL branch of RCA   S/P cataract extraction and insertion of intraocular lens    T2DM (type 2 diabetes mellitus) (HCC) 2010   Review of Systems  Constitutional:  Positive for fatigue.  Genitourinary:  Positive for dysuria  and flank pain.  Skin:  Positive for rash and wound.  Neurological:  Positive for weakness.  All other systems reviewed and are negative.  No Known Allergies Past Surgical History:  Procedure Laterality Date   CARPAL TUNNEL RELEASE Bilateral    CATARACT EXTRACTION, BILATERAL     COLONOSCOPY  11/2012   12 adenomatous polyps, diverticulosis, rec rpt 1 yr [name])   COLONOSCOPY  12/2013   3 polyps, diverticulosis, rec rpt 3 yrs [name])   COLONOSCOPY  06/2019   6 polyps (TA), diverticulosis, f/u left open ended [name])   CORONARY ARTERY BYPASS GRAFT N/A 01/18/2018   Procedure: CORONARY ARTERY BYPASS GRAFTING (CABG) x 3; Using Left Internal Mammary Artery,  and Right Greater Saphenous Vein harvested Endoscopically, Coronary Artery Endarterectomy;  Surgeon: Kerin Perna, MD;  Location: [facility] OR;  Service: Open Heart Surgery;  Laterality: N/A;   ERCP N/A 10/23/2021   Procedure: ENDOSCOPIC RETROGRADE CHOLANGIOPANCREATOGRAPHY (ERCP);  Surgeon: Meryl Dare, MD;  Location: [facility] ENDOSCOPY;  Service: Endoscopy;  Laterality: N/A;   IR THORACENTESIS ASP PLEURAL SPACE W/IMG GUIDE  12/29/2022   KNEE CARTILAGE SURGERY Left    LEFT HEART CATH AND CORONARY ANGIOGRAPHY N/A 12/21/2017   Procedure: LEFT HEART CATH AND CORONARY ANGIOGRAPHY;  Surgeon: Yvonne Kendall, MD;  Location: [facility] INVASIVE CV LAB;  Service: Cardiovascular;  Laterality: N/A;   LITHOTRIPSY     REMOVAL OF STONES  10/23/2021   Procedure: REMOVAL OF STONES;  Surgeon: Meryl Dare, MD;  Location: [facility] ENDOSCOPY;  Service: Endoscopy;;   SPHINCTEROTOMY  10/23/2021   Procedure: Dennison Mascot;  Surgeon: Meryl Dare, MD;  Location[facility] ENDOSCOPY;  Service: Endoscopy;;   TEE WITHOUT CARDIOVERSION N/A 01/18/2018   Procedure: TRANSESOPHAGEAL ECHOCARDIOGRAM (TEE);  Surgeon: Donata Clay, Theron Arista, MD;  Location: [facility] OR;  Service: Open Heart Surgery;  Laterality: N/A;   TEE WITHOUT CARDIOVERSION N/A 10/28/2021   Procedure: TRANSESOPHAGEAL ECHOCARDIOGRAM (TEE);  Surgeon: Chrystie Nose, MD;  Location: [facility] ENDOSCOPY;  Service: Cardiovascular;  Laterality: N/A;   UMBILICAL HERNIA REPAIR     with mesh   MEDICATIONS: Prior to Admission medications   Medication Sig Start Date End Date Taking? Authorizing Provider  acetaminophen (TYLENOL) 325 MG tablet Take 1 tablet (325 mg total) by mouth at bedtime as needed (sleep aid). 01/05/23  Yes Wieting, Richard, MD  apixaban (ELIQUIS) 5 MG TABS tablet TAKE 1 TABLET BY MOUTH TWICE A DAY 08/23/22  Yes End, [name], MD  cefTRIAXone (ROCEPHIN) IVPB Inject 2 g into the vein daily for 19 days. Indication:  E coli liver abscess Last Day of Therapy:  01/25/2023 Labs - Once  weekly on Mondays:  CBC/D and CMP Please pull PIC at completion of IV antibiotics Fax weekly lab results  promptly to [phone] Method of administration: IV Push Method of administration may be changed at the discretion of facility/pharmacy 01/06/23 01/25/23 Yes Wieting, Richard, MD  Cholecalciferol (VITAMIN D3) 25 MCG (1000 UT) CAPS Take 1 capsule (1,000 Units total) by mouth daily. 05/24/20  Yes [name], MD  Cyanocobalamin (B-12) 1000 MCG CAPS Take by mouth daily at 2 am.   Yes [provider]  diphenhydrAMINE (BENADRYL) 25 mg capsule Take 1 capsule (25 mg total) by mouth at bedtime as needed for sleep. 01/05/23  Yes Wieting, Richard, MD  FARXIGA 5 MG TABS tablet Take 5 mg by mouth daily. 10/27/22  Yes [provider]  feeding supplement (ENSURE ENLIVE / ENSURE PLUS) LIQD Take 237 mLs by mouth 2 (two) times  daily between meals. 01/05/23  Yes Wieting, Richard, MD  mirtazapine (REMERON) 7.5 MG tablet Take 1 tablet (7.5 mg total) by mouth at bedtime. 01/05/23  Yes Wieting, Richard, MD  Multiple Vitamins-Minerals (MACULAR VITAMIN BENEFIT PO) Take 1 tablet by mouth daily.   Yes [provider]  nitroGLYCERIN (NITROSTAT) 0.4 MG SL tablet Place 1 tablet (0.4 mg total) under the tongue every 5 (five) minutes as needed for chest pain. 03/26/20  Yes End, [name], MD  oxyCODONE-acetaminophen (PERCOCET/ROXICET) 5-325 MG tablet Take 1 tablet by mouth every 6 (six) hours as needed for moderate pain. 01/05/23  Yes Wieting, Richard, MD  pantoprazole (PROTONIX) 40 MG tablet Take 1 tablet (40 mg total) by mouth daily. 01/06/23  Yes Wieting, Richard, MD  rosuvastatin (CRESTOR) 10 MG tablet TAKE 1 TABLET BY MOUTH EVERY DAY 06/15/22  Yes End, [name], MD  sacubitril-valsartan (ENTRESTO) 24-26 MG Take 1 tablet by mouth 2 (two) times daily. Hold for SBP less than 100 01/05/23  Yes Wieting, Richard, MD  senna-docusate (SENOKOT-S) 8.6-50 MG tablet Take 1 tablet by mouth at bedtime as  needed for mild constipation. 01/05/23  Yes Wieting, Richard, MD  sodium chloride flush (NS) 0.9 % SOLN 5 mLs by Intracatheter route every 8 (eight) hours. 01/05/23  Yes Wieting, Richard, MD  tamsulosin (FLOMAX) 0.4 MG CAPS capsule TAKE 1 CAPSULE BY MOUTH EVERY DAY 09/18/22  Yes [name], MD  furosemide (LASIX) 40 MG tablet Take 1 tablet (40 mg total) by mouth daily as needed (Shortness of breath, or weight gain of 3 pounds in a day or 5 pounds in a week.). Patient not taking: Reported on 01/07/2023 01/05/23   [name], MD    insulin aspart  0-15 Units Subcutaneous TID WC   mirtazapine  7.5 mg Oral QHS   pantoprazole (PROTONIX) IV  40 mg Intravenous Q12H   rosuvastatin  10 mg Oral Daily   sodium chloride flush  3 mL Intravenous Q12H   tamsulosin  0.4 mg Oral Daily     fluconazole (DIFLUCAN) IV Stopped (01/07/23 2240)   metronidazole Stopped (01/07/23 2239)   piperacillin-tazobactam (ZOSYN)  IV     vancomycin     ED Course: Pt in Ed is alert/awake oriented. Vitals:   01/07/23 2130 01/07/23 2200 01/07/23 2230 01/07/23 2241  BP: (!) 114/55 (!) 131/52 (!) 124/41   Pulse: (!) 45 91 77   Resp:      Temp:    98 F (36.7 C)  TempSrc:    Oral  SpO2: 97% 96% 99%   Weight:      Height:       Total I/O In: 617.6 [IV Piggyback:617.6] Out: -  SpO2: 99 % Blood work in ed shows: Glucose 124 normal kidney function, AST of 75 otherwise normal LFTs. Lactic acid of 1.3. Normal white count of 7.9 chronic anemia of 10 since 2019 normal platelet count of 441. Urinalysis shows rare bacteria 21-50 WBCs 21-50 RBCs yellow color hazy 50 glucose small hemoglobin large leukocyte.  Results for orders placed or performed during the hospital encounter of 01/07/23 (from the past 72 hour(s))  CBC     Status: Abnormal   Collection Time: 01/07/23  3:41 PM  Result Value Ref Range   WBC 7.9 4.0 - 10.5 K/uL   RBC 3.71 (L) 4.22 - 5.81 MIL/uL   Hemoglobin 10.0 (L) 13.0 - 17.0 g/dL   HCT 16.1 (L)  09.6 - 52.0 %   MCV 88.9 80.0 - 100.0 fL  MCH 27.0 26.0 - 34.0 pg   MCHC 30.3 30.0 - 36.0 g/dL   RDW 16.1 (H) 09.6 - 04.5 %   Platelets 441 (H) 150 - 400 K/uL   nRBC 0.0 0.0 - 0.2 %    Comment: Performed at [facility], [address] [place], [place] [address]  Comprehensive metabolic panel     Status: Abnormal   Collection Time: 01/07/23  3:41 PM  Result Value Ref Range   Sodium 136 135 - 145 mmol/L   Potassium 4.2 3.5 - 5.1 mmol/L   Chloride 104 98 - 111 mmol/L   CO2 27 22 - 32 mmol/L   Glucose, Bld 124 (H) 70 - 99 mg/dL    Comment: Glucose reference range applies only to samples taken after fasting for at least 8 hours.   BUN 21 8 - 23 mg/dL   Creatinine, Ser 1.91 0.61 - 1.24 mg/dL   Calcium 8.1 (L) 8.9 - 10.3 mg/dL   Total Protein 5.3 (L) 6.5 - 8.1 g/dL   Albumin 2.1 (L) 3.5 - 5.0 g/dL   AST 75 (H) 15 - 41 U/L   ALT 42 0 - 44 U/L   Alkaline Phosphatase 62 38 - 126 U/L   Total Bilirubin 0.4 0.3 - 1.2 mg/dL   GFR, Estimated >47 >82 mL/min    Comment: (NOTE) Calculated using the CKD-EPI Creatinine Equation (2021)    Anion gap 5 5 - 15    Comment: Performed at [facility], [address] [place], [place] [address]  Lipase, blood     Status: None   Collection Time: 01/07/23  3:41 PM  Result Value Ref Range   Lipase 24 11 - 51 U/L    Comment: Performed at [facility], [address] [place], [place] [address]  Lactic acid, plasma     Status: None   Collection Time: 01/07/23  3:41 PM  Result Value Ref Range   Lactic Acid, Venous 1.3 0.5 - 1.9 mmol/L    Comment: Performed at [facility], [address] [place], [place] [address]  Urinalysis, Complete w Microscopic -Urine, Clean Catch     Status: Abnormal   Collection Time: 01/07/23  8:45 PM  Result Value Ref Range   Color, Urine YELLOW (A) YELLOW   APPearance HAZY (A) CLEAR   Specific Gravity, Urine 1.044 (H) 1.005 - 1.030   pH 5.0 5.0 - 8.0   Glucose, UA 50 (A) NEGATIVE  mg/dL   Hgb urine dipstick SMALL (A) NEGATIVE   Bilirubin Urine NEGATIVE NEGATIVE   Ketones, ur NEGATIVE NEGATIVE mg/dL   Protein, ur NEGATIVE NEGATIVE mg/dL   Nitrite NEGATIVE NEGATIVE   Leukocytes,Ua LARGE (A) NEGATIVE   RBC / HPF 21-50 0 - 5 RBC/hpf   WBC, UA 21-50 0 - 5 WBC/hpf   Bacteria, UA RARE (A) NONE SEEN   Squamous Epithelial / HPF 0-5 0 - 5 /HPF   Mucus PRESENT     Comment: Performed at [facility], [address] [place], [place] [address]  Type and screen     Status: None (Preliminary result)   Collection Time: 01/07/23  8:45 PM  Result Value Ref Range   ABO/RH(D) PENDING    Antibody Screen PENDING    Sample Expiration      01/10/2023,2359 Performed at [facility], [address] [place], [place] [address]   Type and screen Ordered by PROVIDER DEFAULT     Status: None   Collection Time:  01/07/23 10:06 PM  Result Value Ref Range   ABO/RH(D) A POS    Antibody Screen NEG    Sample Expiration      01/10/2023,2359 Performed at [facility], [address] [place], [place] [address]     Lab Results  Component Value Date   CREATININE 0.95 01/07/2023   CREATININE 0.85 01/04/2023   CREATININE 1.18 01/02/2023      Latest Ref Rng & Units 01/07/2023    3:41 PM 01/04/2023    4:00 PM 01/02/2023    4:29 AM  CMP  Glucose 70 - 99 mg/dL 604  540  981   BUN 8 - 23 mg/dL 21  28  36   Creatinine 0.61 - 1.24 mg/dL 1.91  4.78  2.95   Sodium 135 - 145 mmol/L 136  137  140   Potassium 3.5 - 5.1 mmol/L 4.2  4.1  4.0   Chloride 98 - 111 mmol/L 104  106  108   CO2 22 - 32 mmol/L 27  25  24    Calcium 8.9 - 10.3 mg/dL 8.1  7.9  8.2   Total Protein 6.5 - 8.1 g/dL 5.3     Total Bilirubin 0.3 - 1.2 mg/dL 0.4     Alkaline Phos 38 - 126 U/L 62     AST 15 - 41 U/L 75     ALT 0 - 44 U/L 42      Unresulted Labs (From admission, onward)     Start     Ordered   01/08/23 0500  Comprehensive metabolic panel  Tomorrow morning,   STAT        01/07/23  2037   01/08/23 0500  CBC  Tomorrow morning,   STAT        01/07/23 2037   01/08/23 0500  APTT  Tomorrow morning,   STAT        01/07/23 2037   01/08/23 0500  Protime-INR  Tomorrow morning,   STAT        01/07/23 2037   Unscheduled  Occult blood card to lab, stool  As needed,   R      01/07/23 2038           Pt has received : Orders Placed This Encounter  Procedures   CT ABDOMEN PELVIS W CONTRAST    Standing Status:   Standing    Number of Occurrences:   1    Order Specific Question:   Does the patient have a contrast media/X-ray dye allergy?    Answer:   No    Order Specific Question:   If indicated for the ordered procedure, I authorize the administration of contrast media per Radiology protocol    Answer:   Yes    Order Specific Question:   If indicated for the ordered procedure, I authorize the administration of oral contrast media per Radiology protocol    Answer:   Yes   IR Radiologist Eval & Mgmt    Standing Status:   Standing    Number of Occurrences:   1    Order Specific Question:   Can the patient sign their own consent?    Answer:   Yes    Order Specific Question:   Is the patient on any bloodthinners?    Answer:   Yes    Order Specific Question:   Is the patient NPO    Answer:   Yes    Order Specific Question:   If indicated  for the ordered procedure, I authorize the administration of contrast media per Radiology protocol    Answer:   Yes    Order Specific Question:   Indication for Consultation:    Answer:   hepatic abscaess.   CBC    Standing Status:   Standing    Number of Occurrences:   1   Comprehensive metabolic panel    Standing Status:   Standing    Number of Occurrences:   1   Lipase, blood    Standing Status:   Standing    Number of Occurrences:   1   Comprehensive metabolic panel    Standing Status:   Standing    Number of Occurrences:   1   CBC    Standing Status:   Standing    Number of Occurrences:   1   APTT    Standing Status:    Standing    Number of Occurrences:   1   Protime-INR    Standing Status:   Standing    Number of Occurrences:   1   Urinalysis, Complete w Microscopic -Urine, Clean Catch    Standing Status:   Standing    Number of Occurrences:   1    Order Specific Question:   Specimen Source    Answer:   Urine, Clean Catch [76]   Occult blood card to lab, stool    Standing Status:   Standing    Number of Occurrences:   3   Diet NPO time specified Except for: Sips with Meds    Standing Status:   Standing    Number of Occurrences:   1    Order Specific Question:   Except for    Answer:   Sips with Meds   Apply Diabetes Mellitus Care Plan    Standing Status:   Standing    Number of Occurrences:   1   STAT CBG when hypoglycemia is suspected. If treated, recheck every 15 minutes after each treatment until CBG >/= 70 mg/dl    Standing Status:   Standing    Number of Occurrences:   1   Refer to Hypoglycemia Protocol Sidebar Report for treatment of CBG < 70 mg/dl    Standing Status:   Standing    Number of Occurrences:   1   No HS correction Insulin    Standing Status:   Standing    Number of Occurrences:   1   Maintain IV access    Standing Status:   Standing    Number of Occurrences:   1   Vital signs    Standing Status:   Standing    Number of Occurrences:   1   Notify physician (specify)    Standing Status:   Standing    Number of Occurrences:   20    Order Specific Question:   Notify Physician    Answer:   for pulse less than 55 or greater than 120    Order Specific Question:   Notify Physician    Answer:   for respiratory rate less than 12 or greater than 25    Order Specific Question:   Notify Physician    Answer:   for temperature greater than 100.5 F    Order Specific Question:   Notify Physician    Answer:   for urinary output less than 30 mL/hr for four hours    Order Specific Question:   Notify Physician    Answer:  for systolic BP less than 90 or greater than 160, diastolic BP  less than 60 or greater than 100    Order Specific Question:   Notify Physician    Answer:   for new hypoxia w/ oxygen saturations < 88%   Progressive Mobility Protocol: No Restrictions    Standing Status:   Standing    Number of Occurrences:   1   Daily weights    Standing Status:   Standing    Number of Occurrences:   1   Intake and Output    Standing Status:   Standing    Number of Occurrences:   1   Do not place and if present remove PureWick    Standing Status:   Standing    Number of Occurrences:   1   Initiate Oral Care Protocol    Standing Status:   Standing    Number of Occurrences:   1   Initiate Carrier Fluid Protocol    Standing Status:   Standing    Number of Occurrences:   1   RN may order General Admission PRN Orders utilizing "General Admission PRN medications" (through manage orders) for the following patient needs: allergy symptoms (Claritin), cold sores (Carmex), cough (Robitussin DM), eye irritation (Liquifilm Tears), hemorrhoids (Tucks), indigestion (Maalox), minor skin irritation (Hydrocortisone Cream), muscle pain Romeo Apple Gay), nose irritation (saline nasal spray) and sore throat (Chloraseptic spray).    Standing Status:   Standing    Number of Occurrences:   [phone]   Cardiac Monitoring - Continuous Indefinite    Standing Status:   Standing    Number of Occurrences:   1    Order Specific Question:   Indications for use:    Answer:   Other    Order Specific Question:   Other indications for use:    Answer:   SEPSIS.   Wound care    Standing Status:   Standing    Number of Occurrences:   1   Full code    Standing Status:   Standing    Number of Occurrences:   1    Order Specific Question:   By:    Answer:   Other   Consult to hospitalist    Standing Status:   Standing    Number of Occurrences:   1    Order Specific Question:   Place call to:    Answer:   [id number]    Order Specific Question:   Reason for Consult    Answer:   Admit   vancomycin per pharmacy  consult    Standing Status:   Standing    Number of Occurrences:   1    Order Specific Question:   Indication:    Answer:   Bacteremia   piperacillin-tazobactam (ZOSYN) per pharmacy consult    Standing Status:   Standing    Number of Occurrences:   1    Order Specific Question:   Antibiotic Indication:    Answer:   Other Indication (list below)    Order Specific Question:   Other Indication:    Answer:   bactermieia/ sepsi   Pulse oximetry check with vital signs    Standing Status:   Standing    Number of Occurrences:   1   Oxygen therapy Mode or (Route): Nasal cannula; Liters Per Minute: 2; Keep 02 saturation: greater than 92 %    Standing Status:   Standing    Number of Occurrences:   20  Order Specific Question:   Mode or (Route)    Answer:   Nasal cannula    Order Specific Question:   Liters Per Minute    Answer:   2    Order Specific Question:   Keep 02 saturation    Answer:   greater than 92 %   Type and screen    Standing Status:   Standing    Number of Occurrences:   1   Type and screen    Standing Status:   Standing    Number of Occurrences:   1   Admit to Inpatient (patient's expected length of stay will be greater than 2 midnights or inpatient only procedure)    Standing Status:   Standing    Number of Occurrences:   1    Order Specific Question:   Hospital Area    Answer:   [facility] [[id number]]    Order Specific Question:   Level of Care    Answer:   Progressive [102]    Order Specific Question:   Admit to Progressive based on following criteria    Answer:   CARDIOVASCULAR & THORACIC of moderate stability with acute coronary syndrome symptoms/low risk myocardial infarction/hypertensive urgency/arrhythmias/heart failure potentially compromising stability and stable post cardiovascular intervention patients.    Order Specific Question:   Admit to Progressive based on following criteria    Answer:   NEUROLOGICAL AND NEUROSURGICAL complex  patients with significant risk of instability, who do not meet ICU criteria, yet require close observation or frequent assessment (< / = every 2 - 4 hours) with medical / nursing intervention.    Order Specific Question:   Covid Evaluation    Answer:   Asymptomatic - no recent exposure (last 10 days) testing not required    Order Specific Question:   Diagnosis    Answer:   Abdominal pain [960454]    Order Specific Question:   Admitting Physician    Answer:   Darrold Junker    Order Specific Question:   Attending Physician    Answer:   Darrold Junker    Order Specific Question:   Certification:    Answer:   I certify this patient will need inpatient services for at least 2 midnights    Order Specific Question:   Estimated Length of Stay    Answer:   7   Aspiration precautions    Standing Status:   Standing    Number of Occurrences:   1   Fall precautions    Standing Status:   Standing    Number of Occurrences:   1    Meds ordered this encounter  Medications   iohexol (OMNIPAQUE) 300 MG/ML solution 100 mL   HYDROmorphone (DILAUDID) injection 0.5 mg   piperacillin-tazobactam (ZOSYN) IVPB 3.375 g    Order Specific Question:   Antibiotic Indication:    Answer:   Intra-abdominal Infection   DISCONTD: vancomycin (VANCOREADY) IVPB 2000 mg/400 mL    Order Specific Question:   Indication:    Answer:   Wound Infection   FOLLOWED BY Linked Order Group    vancomycin (VANCOREADY) IVPB 2000 mg/400 mL     Order Specific Question:   Indication:     Answer:   Wound Infection    vancomycin (VANCOREADY) IVPB 500 mg/100 mL     Order Specific Question:   Indication:     Answer:   Wound Infection   insulin aspart (novoLOG) injection 0-15  Units    Order Specific Question:   Correction coverage:    Answer:   Moderate (average weight, post-op)    Order Specific Question:   CBG < 70:    Answer:   implement hypoglycemia protocol    Order Specific Question:   CBG 70 - 120:    Answer:    0 units    Order Specific Question:   CBG 121 - 150:    Answer:   2 units    Order Specific Question:   CBG 151 - 200:    Answer:   3 units    Order Specific Question:   CBG 201 - 250:    Answer:   5 units    Order Specific Question:   CBG 251 - 300:    Answer:   8 units    Order Specific Question:   CBG 301 - 350:    Answer:   11 units    Order Specific Question:   CBG 351 - 400:    Answer:   15 units    Order Specific Question:   CBG > 400    Answer:   call MD and obtain STAT lab verification   sodium chloride flush (NS) 0.9 % injection 3 mL   OR Linked Order Group    acetaminophen (TYLENOL) tablet 650 mg    acetaminophen (TYLENOL) suppository 650 mg   DISCONTD: HYDROcodone-acetaminophen (NORCO/VICODIN) 5-325 MG per tablet 1 tablet   morphine (PF) 2 MG/ML injection 2 mg   hydrALAZINE (APRESOLINE) injection 5 mg   DISCONTD: aspirin EC tablet 81 mg   metroNIDAZOLE (FLAGYL) IVPB 500 mg    Order Specific Question:   Antibiotic Indication:    Answer:   Intra-abdominal Infection   fluconazole (DIFLUCAN) IVPB 200 mg   pantoprazole (PROTONIX) injection 40 mg   DISCONTD: acetaminophen (TYLENOL) tablet 325 mg   tamsulosin (FLOMAX) capsule 0.4 mg   rosuvastatin (CRESTOR) tablet 10 mg   oxyCODONE-acetaminophen (PERCOCET/ROXICET) 5-325 MG per tablet 1 tablet   nitroGLYCERIN (NITROSTAT) SL tablet 0.4 mg   mirtazapine (REMERON) tablet 7.5 mg   diphenhydrAMINE (BENADRYL) capsule 25 mg   piperacillin-tazobactam (ZOSYN) IVPB 3.375 g    Order Specific Question:   Antibiotic Indication:    Answer:   Wound Infection   vancomycin (VANCOREADY) IVPB 1500 mg/300 mL    Order Specific Question:   Indication:    Answer:   Wound Infection   HYDROmorphone (DILAUDID) injection 1 mg    Admission Imaging : CT ABDOMEN PELVIS W CONTRAST  Result Date: 01/07/2023 CLINICAL DATA:  Right lower quadrant abdominal pain EXAM: CT ABDOMEN AND PELVIS WITH CONTRAST TECHNIQUE: Multidetector CT imaging of the abdomen  and pelvis was performed using the standard protocol following bolus administration of intravenous contrast. RADIATION DOSE REDUCTION: This exam was performed according to the departmental dose-optimization program which includes automated exposure control, adjustment of the mA and/or kV according to patient size and/or use of iterative reconstruction technique. CONTRAST:  OMNIPAQUE IOHEXOL 300 MG/ML  SOLN COMPARISON:  12/28/2022 FINDINGS: Lower chest: Large right pleural effusion with passive atelectasis. Borderline cardiomegaly. Mitral valve calcification. Coronary and descending thoracic aortic atherosclerotic vascular calcification. Hepatobiliary: Markedly reduced size of the anterior perihepatic abscess with a pigtail drainage catheter in place. Residual fluid density in this vicinity proximally 6.2 by 1.4 by 1.0 cm (volume = 4.5 cm^3), formerly 15.8 by 7.1 by 9.8 cm (volume = 580 cm^3) prior to drain placement. Tiny locules of gas within the collection.  Along the inferior right hepatic lobe margin, a new fluid collection with enhancing margins measures 7.4 by 1.2 by 8.8 cm (volume = 41 cm^3) and is suspicious for early perihepatic abscess formation. This is shown for example on image 52 series 5. Surrounding inflammatory stranding tracking along the right paracolic gutter region and right omentum as on image 48 series 2. Chronic relative atrophy in the lateral segment left hepatic lobe with some mild anterior hypoenhancement in the lateral segment which is chronic probably from remote scarring. The gallbladder is absent. Pancreas: Unremarkable Spleen: Unremarkable Adrenals/Urinary Tract: Stable nonobstructive right nephrolithiasis. Stable bilateral renal cysts not substantially changed over the last 10 days. No further imaging workup of these lesions is indicated. No hydronephrosis or hydroureter. The urinary bladder appears unremarkable. Stomach/Bowel: Small periampullary duodenal diverticulum.  Vascular/Lymphatic: Atherosclerosis is present, including aortoiliac atherosclerotic disease. Atheromatous plaque noted at the origins of the celiac trunk and SMA without occlusion. Reproductive: Unremarkable Other: In addition to the inflammatory stranding along the right omentum and right paracolic gutter, there is asymmetric subcutaneous edema in the overlying soft tissues example on image 25 series 2, although no obvious connection between these different anatomic compartments. There is some cutaneous thickening in this region. Mild nonspecific presacral edema. Musculoskeletal: Mild lumbar spondylosis and degenerative disc disease. IMPRESSION: 1. Markedly reduced size of the anterior perihepatic abscess with a pigtail drainage catheter in place. 2. Along the inferior right hepatic lobe margin, there is a new 41 cc fluid collection with enhancing margins suspicious for early perihepatic abscess formation. Adjacent abnormal infiltrative edema tracks along the paracolic gutter and omentum, and there is overlying subcutaneous edema and cutaneous thickening along the right abdomen. 3. Large right pleural effusion with passive atelectasis. 4. Stable nonobstructive right nephrolithiasis. Aortic Atherosclerosis (ICD10-I70.0). Electronically Signed   By: Gaylyn Rong M.D.   On: 01/07/2023 17:21   Physical Examination: Vitals:   01/07/23 2130 01/07/23 2200 01/07/23 2230 01/07/23 2241  BP: (!) 114/55 (!) 131/52 (!) 124/41   Pulse: (!) 45 91 77   Temp:    98 F (36.7 C)  Resp:      Height:      Weight:      SpO2: 97% 96% 99%   TempSrc:    Oral  BMI (Calculated):       Physical Exam Vitals and nursing note reviewed.  Constitutional:      General: He is not in acute distress.    Appearance: He is obese. He is ill-appearing. He is not toxic-appearing or diaphoretic.  HENT:     Head: Normocephalic and atraumatic.     Right Ear: Hearing and external ear normal.     Left Ear: Hearing and external ear  normal.     Nose: Nose normal. No nasal deformity.     Mouth/Throat:     Lips: Pink.     Mouth: Mucous membranes are moist.     Tongue: No lesions.     Pharynx: Oropharynx is clear.  Eyes:     Extraocular Movements: Extraocular movements intact.  Cardiovascular:     Rate and Rhythm: Normal rate and regular rhythm.     Pulses: Normal pulses.     Heart sounds: Murmur heard.  Pulmonary:     Effort: Pulmonary effort is normal.     Breath sounds: Normal breath sounds.  Abdominal:     General: Abdomen is protuberant. Bowel sounds are normal. There is no distension.     Palpations: Abdomen is soft. There is  no mass.     Tenderness: There is generalized abdominal tenderness. There is right CVA tenderness. There is no guarding.     Hernia: No hernia is present.    Genitourinary:    Pubic Area: Rash present.     Penis: Erythema and discharge present.   Musculoskeletal:     Right lower leg: Edema present.     Left lower leg: Edema present.  Skin:    General: Skin is warm.  Neurological:     General: No focal deficit present.     Mental Status: He is alert and oriented to person, place, and time.     Cranial Nerves: Cranial nerves 2-12 are intact.     Motor: Motor function is intact.  Psychiatric:        Attention and Perception: Attention normal.        Mood and Affect: Mood normal.        Speech: Speech normal.        Behavior: Behavior normal. Behavior is cooperative.        Cognition and Memory: Cognition normal.     Assessment and Plan: * Abdominal pain As needed Dilaudid. Secondary to hepatic abscess, cellulitis.     Extensive swelling along rt flank and area around the drain. Swelling is large and palpable also on ct extensive.   Hepatic abscess Pt has Drain placed currently.  And has another hepatic abscess. We will C/W consult with IR for plan for recurrent abscess. Pt will need to have existing drain removed and have another one placed if iv abx regimen. We  will hold Eliquis.  ID consult per AM team.     Decubitus ulcer of right buttock, stage 2 (HCC)  We will continue with wound care and iv  abx.    Chronic diastolic CHF (congestive heart failure) (HCC) Patient has a history of heart disease and chronic diastolic congestive heart failure are stable. Pt has LE edema of 2 + but no JVD or SOB. Currently we will hold entreso / lasix/ farxiga  because of low blood pressure.  D/C farxiga as pt has yeast infection.   Recurrent right pleural effusion Pt has started to have recurrent right pleural effusion.  IR consulted for hepatic abscess and pleural effusion.   Bacteremia due to Escherichia coli ? Source . ? WBC tagged scan pt has been having bacteriemia related complication since 2023. Suspect he needs GI eval.for colonoscopy.  We will cont pt on VANCOMYCIN/ ZOSYN/ FLAGYL Q8 / DIFLUCAN .   Paroxysmal atrial fibrillation (HCC) Continue eliquis.   Anemia Type/ screen/ FOBT. Suspect chronic GI loss. IV PPI.   Coronary artery disease Stable pt has not chest pain.  We will cont hydralazine.    Advanced care planning/counseling discussion Pt wants to be full code daughter  who is RN is at bedside and they all agree about MOST form.   CKD stage 3 due to type 2 diabetes mellitus [facility]) Lab Results  Component Value Date   CREATININE 0.95 01/07/2023   CREATININE 0.85 01/04/2023   CREATININE 1.18 01/02/2023  Stable we will follow.    GERD (gastroesophageal reflux disease) IV PPI.   Type 2 diabetes mellitus with other specified complication (HCC) Glycemic protocol. D/C farxiga.     DVT prophylaxis:  Eliquis.    Code Status:  Full code    01/07/2023    8:26 PM  Advanced Directives  Does Patient Have a Medical Advance Directive? No;Yes    Family Communication:  Daughter and wife at bedside Emergency Contact: Contact Information     Name Relation Home Work Mobile   Breighner,[name]   [phone]    Reyaan, Thoma Daughter   [phone]       Disposition Plan:  To be determined Consults: Interventional radiology Admission status: Inpatient Unit / Expected LOS: Progressive/7 days  Gertha Calkin MD [facility]  6 PM- 2 AM. [phone]( Pager )  For questions regarding this patient please use [email or web address] to contact the current [facility] MD.   Bonita Quin may also call [phone] to contact current [facility]igned [facility] Attending/Consulting MD for this patient.

## 2023-01-07 NOTE — Assessment & Plan Note (Addendum)
Glycemic protocol. D/C farxiga.

## 2023-01-07 NOTE — Assessment & Plan Note (Signed)
Pt has Drain placed currently.  And has another hepatic abscess. We will C/W consult with IR for plan for recurrent abscess. Pt will need to have existing drain removed and have another one placed if iv abx regimen. We will hold Eliquis.  ID consult per AM team.

## 2023-01-07 NOTE — ED Provider Triage Note (Signed)
Emergency Medicine Provider Triage Evaluation Note  Ikechukwu Cerny., a 82 y.o. male  was evaluated in triage.  Pt complains of resents to the ED via EMS from home.  Patient is under the care of [facility] where he has a PICC line and a hepatic drain in place secondary to a hepatic abscess.  Patient daughter was concerned because the right lower quadrant of abdomen appears edematous and mildly erythematous.  Patient would endorse that the right lower quadrant does feel tight overall.  Denies any change to drainage fluid.  Review of Systems  Positive: RLQ abd wall tightness Negative: FCS  Physical Exam  BP 114/62   Pulse 89   Temp 97.8 F (36.6 C)   Resp 18   Ht 5\' 10"  (1.778 m)   Wt 97.5 kg   SpO2 99%   BMI 30.85 kg/m  Gen:   Awake, no distress  NAD Resp:  Normal effort CTA MSK:   Moves extremities without difficulty  Other:  Soft, nontender. RLQ abd wall edema noted  Medical Decision Making  Medically screening exam initiated at 3:58 PM.  Appropriate orders placed.  [name] was informed that the remainder of the evaluation will be completed by another provider, this initial triage assessment does not replace that evaluation, and the importance of remaining in the ED until their evaluation is complete.  Geriatric patient to the ED from his facility.  Patient patient with advanced directives in place including a DNR presents for concern over edema to the right lower quadrant of the abdominal wall.   Lissa Hoard, PA-C 01/07/23 1603

## 2023-01-07 NOTE — ED Triage Notes (Signed)
See first nurse note. Pt reports he is here for right sided abd pain for "awhile". Has drain to right abd. States was sent here because "site does not look right". Has PICC line to left arm, reports getting antibiotics. Pt not answering all orientation questions appropriately.

## 2023-01-07 NOTE — Assessment & Plan Note (Addendum)
Ruled Out.

## 2023-01-07 NOTE — Assessment & Plan Note (Signed)
IV PPI. 

## 2023-01-07 NOTE — Assessment & Plan Note (Signed)
Type/ screen/ FOBT. Suspect chronic GI loss. IV PPI.

## 2023-01-07 NOTE — Assessment & Plan Note (Signed)
As needed Dilaudid. Secondary to hepatic abscess, cellulitis.     Extensive swelling along rt flank and area around the drain. Swelling is large and palpable also on ct extensive.

## 2023-01-07 NOTE — ED Triage Notes (Signed)
First Nurse Note;  Pt via EMS from [facility]. Pt c/o swelling and pain on the R side of his abd. States that the fluid out of the drainage tube is bloody. Also, c/o pain. Pt was seen on Friday for a liver abscess. Pt is A&Ox4 and NAD 122/74 BP 73 HR  94% on RA 16 RR 142 CBG

## 2023-01-07 NOTE — ED Provider Notes (Signed)
[facility] Provider Note    Event Date/Time   First MD Initiated Contact with Patient 01/07/23 1807     (approximate)   History   Abdominal Pain   HPI  [name] Shelp. is a 82 y.o. male   who I saw previously with recently diagnosed hepatic abscess who comes in with worsening right-sided swelling, right-sided pain.  They report being compliant with the ceftriaxone.  Coming from [facility].  No fevers no other new symptoms.  No falls hitting his head.      Physical Exam   Triage Vital Signs: ED Triage Vitals [01/07/23 1539]  Enc Vitals Group     BP 114/62     Pulse Rate 89     Resp 18     Temp 97.8 F (36.6 C)     Temp src      SpO2 99 %     Weight 215 lb (97.5 kg)     Height 5\' 10"  (1.778 m)     Head Circumference      Peak Flow      Pain Score 7     Pain Loc      Pain Edu?      Excl. in GC?     Most recent vital signs: Vitals:   01/07/23 1539  BP: 114/62  Pulse: 89  Resp: 18  Temp: 97.8 F (36.6 C)  SpO2: 99%     General: Awake, no distress.  CV:  Good peripheral perfusion.  Resp:  Normal effort.  Abd:  Some increased fullness noted to the right abdomen with some hardened feeling but no obvious erythema.   Other:     ED Results / Procedures / Treatments   Labs (all labs ordered are listed, but only abnormal results are displayed) Labs Reviewed  CBC - Abnormal; Notable for the following components:      Result Value   RBC 3.71 (*)    Hemoglobin 10.0 (*)    HCT 33.0 (*)    RDW 15.8 (*)    Platelets 441 (*)    All other components within normal limits  COMPREHENSIVE METABOLIC PANEL - Abnormal; Notable for the following components:   Glucose, Bld 124 (*)    Calcium 8.1 (*)    Total Protein 5.3 (*)    Albumin 2.1 (*)    AST 75 (*)    All other components within normal limits  LIPASE, BLOOD  LACTIC ACID, PLASMA     RADIOLOGY I have reviewed the CT personally interpreted and concern for new hepatic  abscess PROCEDURES:  Critical Care performed: No  Procedures   MEDICATIONS ORDERED IN ED: Medications  iohexol (OMNIPAQUE) 300 MG/ML solution 100 mL (100 mLs Intravenous Contrast Given 01/07/23 1643)     IMPRESSION / MDM / ASSESSMENT AND PLAN / ED COURSE  I reviewed the triage vital signs and the nursing notes.   Patient's presentation is most consistent with acute presentation with potential threat to life or bodily function.   Patient comes in with worsening right upper quadrant pain and some changes to the skin no obvious cellulitis CT imaging done to further evaluate with concern for new peri hepatic abscess.  I attempted to call infectious disease x 3 to get recommendations on antibiotics.  I reviewed patient's prior culture that grew E. coli that was pansensitive but given that symptoms are worsening while on this I will broaden him back out to Vanco and Zosyn.  I discussed  with IR who does think they could potentially drain this.  Patient also has recurrent pleural effusions currently asymptomatic from it sats are normal.  I will discuss with the hospital team for admission.  Patient does not meet sepsis criteria so sepsis order set was not used    Clinical Course as of 01/07/23 1906  Sun Jan 07, 2023  1856 CT ABDOMEN PELVIS W CONTRAST [MF]    Clinical Course User Index [MF] Concha Se, MD     FINAL CLINICAL IMPRESSION(S) / ED DIAGNOSES   Final diagnoses:  Perihepatic abscess (HCC)  Pleural effusion     Rx / DC Orders   ED Discharge Orders     None        Note:  This document was prepared using Dragon voice recognition software and may include unintentional dictation errors.   Concha Se, MD 01/07/23 [phone]

## 2023-01-08 ENCOUNTER — Inpatient Hospital Stay: Payer: Medicare PPO | Admitting: Radiology

## 2023-01-08 ENCOUNTER — Inpatient Hospital Stay: Payer: Medicare PPO

## 2023-01-08 ENCOUNTER — Encounter: Payer: Self-pay | Admitting: Internal Medicine

## 2023-01-08 DIAGNOSIS — R1011 Right upper quadrant pain: Secondary | ICD-10-CM

## 2023-01-08 HISTORY — PX: IR CATHETER TUBE CHANGE: IMG717

## 2023-01-08 LAB — COMPREHENSIVE METABOLIC PANEL
ALT: 36 U/L (ref 0–44)
AST: 56 U/L — ABNORMAL HIGH (ref 15–41)
Albumin: 2 g/dL — ABNORMAL LOW (ref 3.5–5.0)
Alkaline Phosphatase: 58 U/L (ref 38–126)
Anion gap: 6 (ref 5–15)
BUN: 18 mg/dL (ref 8–23)
CO2: 27 mmol/L (ref 22–32)
Calcium: 8.1 mg/dL — ABNORMAL LOW (ref 8.9–10.3)
Chloride: 105 mmol/L (ref 98–111)
Creatinine, Ser: 0.88 mg/dL (ref 0.61–1.24)
GFR, Estimated: >60 mL/min (ref >60)
Glucose, Bld: 101 mg/dL — ABNORMAL HIGH (ref 70–99)
Potassium: 4 mmol/L (ref 3.5–5.1)
Sodium: 138 mmol/L (ref 135–145)
Total Bilirubin: 0.5 mg/dL (ref 0.3–1.2)
Total Protein: 5.4 g/dL — ABNORMAL LOW (ref 6.5–8.1)

## 2023-01-08 LAB — CBC
HCT: 30 % — ABNORMAL LOW (ref 39.0–52.0)
Hemoglobin: 9.2 g/dL — ABNORMAL LOW (ref 13.0–17.0)
MCH: 27.2 pg (ref 26.0–34.0)
MCHC: 30.7 g/dL (ref 30.0–36.0)
MCV: 88.8 fL (ref 80.0–100.0)
Platelets: 440 10*3/uL — ABNORMAL HIGH (ref 150–400)
RBC: 3.38 MIL/uL — ABNORMAL LOW (ref 4.22–5.81)
RDW: 15.8 % — ABNORMAL HIGH (ref 11.5–15.5)
WBC: 7.9 10*3/uL (ref 4.0–10.5)
nRBC: 0 % (ref 0.0–0.2)

## 2023-01-08 LAB — APTT: aPTT: 45 seconds — ABNORMAL HIGH (ref 24–36)

## 2023-01-08 LAB — GLUCOSE, CAPILLARY: Glucose-Capillary: 97 mg/dL (ref 70–99)

## 2023-01-08 LAB — CBG MONITORING, ED
Glucose-Capillary: 117 mg/dL — ABNORMAL HIGH (ref 70–99)
Glucose-Capillary: 105 mg/dL — ABNORMAL HIGH (ref 70–99)
Glucose-Capillary: 86 mg/dL (ref 70–99)
Glucose-Capillary: 80 mg/dL (ref 70–99)

## 2023-01-08 LAB — PROTIME-INR
INR: 1.5 — ABNORMAL HIGH (ref 0.8–1.2)
Prothrombin Time: 17.6 seconds — ABNORMAL HIGH (ref 11.4–15.2)

## 2023-01-08 LAB — AEROBIC/ANAEROBIC CULTURE W GRAM STAIN (SURGICAL/DEEP WOUND)

## 2023-01-08 MED ORDER — ENOXAPARIN SODIUM 40 MG/0.4ML IJ SOSY
40.0000 mg | PREFILLED_SYRINGE | INTRAMUSCULAR | Status: DC
  Administered 2023-01-08: 40 mg via SUBCUTANEOUS
  Filled 2023-01-08: qty 0.4

## 2023-01-08 MED ORDER — SODIUM CHLORIDE 0.9 % IV SOLN: INTRAVENOUS | Status: AC

## 2023-01-08 MED ORDER — FENTANYL CITRATE (PF) 100 MCG/2ML IJ SOLN
INTRAMUSCULAR | Status: AC
  Filled 2023-01-08: qty 2

## 2023-01-08 MED ORDER — PANTOPRAZOLE SODIUM 40 MG PO TBEC
40.0000 mg | DELAYED_RELEASE_TABLET | Freq: Every day | ORAL | Status: DC
  Administered 2023-01-09 – 2023-01-12 (×4): 40 mg via ORAL
  Filled 2023-01-08 (×4): qty 1

## 2023-01-08 MED ORDER — IOHEXOL 300 MG/ML  SOLN
5.0000 mL | Freq: Once | INTRAMUSCULAR | Status: AC | PRN
  Administered 2023-01-08: 5 mL

## 2023-01-08 MED ORDER — LIDOCAINE HCL 1 % IJ SOLN
8.0000 mL | Freq: Once | INTRAMUSCULAR | Status: AC
  Administered 2023-01-08: 8 mL
  Filled 2023-01-08: qty 8

## 2023-01-08 MED ORDER — LIDOCAINE HCL 1 % IJ SOLN
INTRAMUSCULAR | Status: AC
  Filled 2023-01-08: qty 20

## 2023-01-08 MED ORDER — ENSURE ENLIVE PO LIQD
237.0000 mL | Freq: Two times a day (BID) | ORAL | Status: DC
  Administered 2023-01-09 – 2023-01-12 (×6): 237 mL via ORAL

## 2023-01-08 MED ORDER — FENTANYL CITRATE (PF) 100 MCG/2ML IJ SOLN
INTRAMUSCULAR | Status: AC | PRN
  Administered 2023-01-08 (×2): 25 ug via INTRAVENOUS

## 2023-01-08 MED ORDER — MIDAZOLAM HCL 2 MG/2ML IJ SOLN
INTRAMUSCULAR | Status: AC
  Filled 2023-01-08: qty 2

## 2023-01-08 MED ORDER — MIDAZOLAM HCL 2 MG/2ML IJ SOLN
INTRAMUSCULAR | Status: AC | PRN
  Administered 2023-01-08 (×2): .5 mg via INTRAVENOUS

## 2023-01-08 MED ORDER — LIDOCAINE HCL (PF) 1 % IJ SOLN
10.0000 mL | Freq: Once | INTRAMUSCULAR | Status: AC
  Administered 2023-01-08: 10 mL via INTRADERMAL

## 2023-01-08 MED ORDER — CHLORHEXIDINE GLUCONATE CLOTH 2 % EX PADS
6.0000 | MEDICATED_PAD | Freq: Every day | CUTANEOUS | Status: DC
  Administered 2023-01-08 – 2023-01-12 (×5): 6 via TOPICAL

## 2023-01-08 NOTE — Assessment & Plan Note (Signed)
Pt has started to have recurrent right pleural effusion.  IR consulted for hepatic abscess and pleural effusion.

## 2023-01-08 NOTE — Assessment & Plan Note (Signed)
Lab Results  Component Value Date   CREATININE 0.95 01/07/2023   CREATININE 0.85 01/04/2023   CREATININE 1.18 01/02/2023  Stable we will follow.

## 2023-01-08 NOTE — Procedures (Signed)
Interventional Radiology Procedure Note  Date of Procedure: 01/08/2023  Procedure: CT thoracentesis; CT aspiration of subhepatic collection   Findings:  1. Successful CT right thora yielding approx 900 ml clear straw colored fluid  2. Successful CT aspiration of subhepatic collection; note only a minimal ~51ml thin yellow fluid was able to be aspirated despited repositioning the needle; as the sample did not appear grossly purulent, more aggressive aspiration/drain placement was not pursued    Complications: No immediate complications noted.   Estimated Blood Loss: minimal  Follow-up and Recommendations: 1. Follow up microbiology from subhepatic collection aspirate    Olive Bass, MD  Vascular & Interventional Radiology  01/08/2023 4:35 PM

## 2023-01-08 NOTE — ED Notes (Signed)
Report given to [employer] in [employer]

## 2023-01-08 NOTE — Assessment & Plan Note (Signed)
Pt wants to be full code daughter  who is RN is at bedside and they all agree about MOST form.

## 2023-01-08 NOTE — ED Notes (Addendum)
Pt transferred to the OR by [facility]

## 2023-01-08 NOTE — ED Notes (Signed)
Pt back from CT

## 2023-01-08 NOTE — Progress Notes (Signed)
  PROGRESS NOTE    [name]  ION:[id number] DOB: July 25, 1941 DOA: 01/07/2023 PCP: [name], MD  [id number]  LOS: 1 day   Brief hospital course:   Assessment & Plan: Jaskaran, Dauzat. 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  Pt was recently discharged on 01/05/23 with drain to liver abscess and 4 weeks of ceftriaxone.  Liver abscesses --repositioned existing drain with IR --CT-guided aspiration of a 2nd abscess with IR --cont abx as zosyn  Decubitus ulcer of right buttock, stage 2   Chronic diastolic CHF (congestive heart failure) (HCC) --hold Entresto and lasix due to soft BP   Recurrent right pleural effusion --presumed due to liver abscess --IR to eval for thoracentesis   Bacteremia due to Escherichia coli, ruled out   Paroxysmal atrial fibrillation (HCC) --hold Eliquis   Anemia, chronic   Coronary artery disease Stable pt has not chest pain.  --cont statin   CKD stage 3, ruled out --GFR >60   GERD (gastroesophageal reflux disease) D/c IV PPI and cont home oral PPI   Type 2 diabetes mellitus with other specified complication (HCC) --d/c BG checks and SSI    DVT prophylaxis: Lovenox SQ Code Status: Full code  Family Communication: wife and daughter updated at bedside today Level of care: Progressive Dispo:   The patient is from: SNF rehab Anticipated d/c is to: SNF rehab Anticipated d/c date is: 2-3 days   Subjective and Interval History:  Pain controlled.  Right flank more  swollen.   Objective: Vitals:   01/08/23 1630 01/08/23 1640 01/08/23 1645 01/08/23 1700  BP: 132/78  (!) 116/49 (!) 119/59  Pulse: 84 74 63   Resp: (!) 21  15 16   Temp:      TempSrc:      SpO2: 100% 98% 98% 96%  Weight:      Height:        Intake/Output Summary (Last 24 hours) at 01/08/2023 1729 Last data filed at 01/08/2023 1700 Gross per 24 hour  Intake 760.18 ml  Output 1200 ml  Net -439.82 ml   Filed Weights   01/07/23 1539  Weight: 97.5 kg    Examination:   Constitutional: NAD, AAOx3 HEENT: conjunctivae and lids normal, EOMI, hard of hearing CV: No cyanosis.   RESP: normal respiratory effort, on RA Abdomen: liver drain with serosanguinous output Neuro: II - XII grossly intact.     Data Reviewed: I have personally reviewed labs and imaging studies  Time spent: 50 minutes  Darlin Priestly, MD [facility] If 7PM-7AM, please contact night-coverage 01/08/2023, 5:29 PM

## 2023-01-08 NOTE — ED Notes (Signed)
Pt transported to IR 

## 2023-01-08 NOTE — Consult Note (Addendum)
Chief Complaint: Hepatic abscess and right sided pleural effusion. Request is for abscess drain reposition, hepatic abscess aspiration and right sided thoracentesis  Referring Physician(s): Dr. Renaldo Reel  Supervising Physician: Pernell Dupre  Patient Status: [facility] - Out-pt  History of Present Illness: [name] Young. is a 82 y.o. male outpatient (In the ED). Known to IR. History of DM, CAD s/p CABG X 3, HTN, HLD, CHF, GERD, diverticulosis, cardiomyopathy, BPH. Perihepatic abscess s/p drain placement on 4.19.24 by IR Attending Dr. Richarda Overlie. Cultures from the abscess grew E. Coli. Patient presented to the ED at [facility] on 4.28.24 with worsening right sided abdominal pain.  CT Abd pelvis from 4.28.24 reads Markedly reduced size of the anterior perihepatic abscess with a pigtail drainage catheter in place. 2. Along the inferior right hepatic lobe margin, there is a new 41 cc fluid collection with enhancing margins suspicious for early perihepatic abscess formation. Adjacent abnormal infiltrative edema tracks along the paracolic gutter and omentum, and there is overlying subcutaneous edema and cutaneous thickening along the right abdomen. Team is requesting an perihepatic abscess drain  reposition, hepatic abscess aspiration and right sided thoracentesis  Wife at bedside. Family friend / POA at bedside. Patient alert and laying in bed,calm. Endorses right side flank pain. Family concerned about pain control. Discussed with IR Attending. Okay to attempt procedure with conscious sedation.  Denies any fevers, headache, chest pain, SOB, cough,nausea, vomiting or bleeding. Return precautions and treatment recommendations and follow-up discussed with the patient and his wife both who are agreeable with the plan.    Past Medical History:  Diagnosis Date   Adenomatous colon polyp    Aortic atherosclerosis (HCC)    Arthritis    Ascending aorta dilatation (HCC)    a.) TTE 11/23/2017: asc Ao  measured 37 mm; b.) TTE 10/23/2021: Ao root measured 41 mm, asc Ao measured 38 mm; c.) TEE 10/28/2021: asc Ao measured 38 mm; d.) TTE 01/11/2022: Ao root 40 mm, asc Ao 39 mm   Ascending cholangitis 10/2021   BPH (benign prostatic hyperplasia)    Cardiomyopathy (HCC)    CKD (chronic kidney disease), stage III (HCC)    Claustrophobia    Coronary artery disease    a.) LHC 12/21/2017: 75% mLAD, 90% D1, 80-99% OM1/2, CTO pRCA (L-R collaterals) --> CVTS consult. 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   In setting of bacteremia from ascending colangitis   GERD (gastroesophageal reflux disease)    HFrEF (heart failure with reduced ejection fraction) (HCC)    a.) TTE 11/23/2017: EF 50-55%, mod MAC, triv TR, G1DD; b.) TTE 10/23/2021: EF 40-45%, mild LAE, Ao sclerosis, triv MR, G2DD; c.) TEE 10/28/2021: EF 40-45%, glob HK, mobile vegitation on MV; d.) TTE 01/11/2022: EF 50-55%, mild LVH, RVE, Ao sclerosis, mild MR/AR, G1DD   History of cholelithiasis    History of kidney stones    ca ox Vonita Moss @ [facility]) now [facility] [facility]   History of pneumonia    HLD (hyperlipidemia)    HTN (hypertension)    Jaundice    age 55   Long term current use of anticoagulant    a.) apixaban   Paroxysmal atrial fibrillation (HCC)    a.) CHA2DS2VASc = 6 (age x 2, HFrEF, HTN, vascular disease history, T2DM);  b.) rate/rhythm maintained on oral metoprolol succinate; chronically anticoagulated with apixaban   Pneumonia    Right-sided carotid artery disease (HCC)  a.) carotid doppler 04/05/2022: 1-39% RICA   S/P CABG x 3    a.) LIMA-LAD, SVG-diagonal, SVG-PL branch of RCA   S/P cataract extraction and insertion of intraocular lens    T2DM (type 2 diabetes mellitus) (HCC) 2010    Past Surgical History:  Procedure Laterality Date   CARPAL TUNNEL RELEASE Bilateral    CATARACT EXTRACTION, BILATERAL     COLONOSCOPY  11/2012   11 adenomatous polyps, diverticulosis, rec  rpt 1 yr [name])   COLONOSCOPY  12/2013   3 polyps, diverticulosis, rec rpt 3 yrs [name])   COLONOSCOPY  06/2019   6 polyps (TA), diverticulosis, f/u left open ended [name])   CORONARY ARTERY BYPASS GRAFT N/A 01/18/2018   Procedure: CORONARY ARTERY BYPASS GRAFTING (CABG) x 3; Using Left Internal Mammary Artery, and Right Greater Saphenous Vein harvested Endoscopically, Coronary Artery Endarterectomy;  Surgeon: Kerin Perna, MD;  Location: [facility] OR;  Service: Open Heart Surgery;  Laterality: N/A;   ERCP N/A 10/23/2021   Procedure: ENDOSCOPIC RETROGRADE CHOLANGIOPANCREATOGRAPHY (ERCP);  Surgeon: Meryl Dare, MD;  Location: [facility] ENDOSCOPY;  Service: Endoscopy;  Laterality: N/A;   IR THORACENTESIS ASP PLEURAL SPACE W/IMG GUIDE  12/29/2022   KNEE CARTILAGE SURGERY Left    LEFT HEART CATH AND CORONARY ANGIOGRAPHY N/A 12/21/2017   Procedure: LEFT HEART CATH AND CORONARY ANGIOGRAPHY;  Surgeon: Yvonne Kendall, MD;  Location: [facility] INVASIVE CV LAB;  Service: Cardiovascular;  Laterality: N/A;   LITHOTRIPSY     REMOVAL OF STONES  10/23/2021   Procedure: REMOVAL OF STONES;  Surgeon: Meryl Dare, MD;  Location[facility] ENDOSCOPY;  Service: Endoscopy;;   SPHINCTEROTOMY  10/23/2021   Procedure: Dennison Mascot;  Surgeon: Meryl Dare, MD;  Location: [facility] ENDOSCOPY;  Service: Endoscopy;;   TEE WITHOUT CARDIOVERSION N/A 01/18/2018   Procedure: TRANSESOPHAGEAL ECHOCARDIOGRAM (TEE);  Surgeon: Donata Clay, Theron Arista, MD;  Location: [facility] OR;  Service: Open Heart Surgery;  Laterality: N/A;   TEE WITHOUT CARDIOVERSION N/A 10/28/2021   Procedure: TRANSESOPHAGEAL ECHOCARDIOGRAM (TEE);  Surgeon: Chrystie Nose, MD;  Location: [facility] ENDOSCOPY;  Service: Cardiovascular;  Laterality: N/A;   UMBILICAL HERNIA REPAIR     with mesh    Allergies: Patient has no known allergies.  Medications: Prior to Admission medications   Medication Sig Start Date End Date Taking? Authorizing Provider  acetaminophen (TYLENOL) 325 MG tablet Take  1 tablet (325 mg total) by mouth at bedtime as needed (sleep aid). 01/05/23  Yes Wieting, Richard, MD  apixaban (ELIQUIS) 5 MG TABS tablet TAKE 1 TABLET BY MOUTH TWICE A DAY 08/23/22  Yes End, [name], MD  cefTRIAXone (ROCEPHIN) IVPB Inject 2 g into the vein daily for 19 days. Indication:  E coli liver abscess Last Day of Therapy:  01/25/2023 Labs - Once weekly on Mondays:  CBC/D and CMP Please pull PIC at completion of IV antibiotics Fax weekly lab results  promptly to [phone] Method of administration: IV Push Method of administration may be changed at the discretion of facility/pharmacy 01/06/23 01/25/23 Yes Wieting, Richard, MD  Cholecalciferol (VITAMIN D3) 25 MCG (1000 UT) CAPS Take 1 capsule (1,000 Units total) by mouth daily. 05/24/20  Yes [name], MD  Cyanocobalamin (B-12) 1000 MCG CAPS Take by mouth daily at 2 am.   Yes [provider]  diphenhydrAMINE (BENADRYL) 25 mg capsule Take 1 capsule (25 mg total) by mouth at bedtime as needed for sleep. 01/05/23  Yes Wieting, Richard, MD  FARXIGA 5 MG TABS tablet Take 5 mg by mouth  daily. 10/27/22  Yes [provider]  feeding supplement (ENSURE ENLIVE / ENSURE PLUS) LIQD Take 237 mLs by mouth 2 (two) times daily between meals. 01/05/23  Yes Wieting, Richard, MD  mirtazapine (REMERON) 7.5 MG tablet Take 1 tablet (7.5 mg total) by mouth at bedtime. 01/05/23  Yes Wieting, Richard, MD  Multiple Vitamins-Minerals (MACULAR VITAMIN BENEFIT PO) Take 1 tablet by mouth daily.   Yes [provider]  nitroGLYCERIN (NITROSTAT) 0.4 MG SL tablet Place 1 tablet (0.4 mg total) under the tongue every 5 (five) minutes as needed for chest pain. 03/26/20  Yes End, [name], MD  oxyCODONE-acetaminophen (PERCOCET/ROXICET) 5-325 MG tablet Take 1 tablet by mouth every 6 (six) hours as needed for moderate pain. 01/05/23  Yes Wieting, Richard, MD  pantoprazole (PROTONIX) 40 MG tablet Take 1 tablet (40 mg total) by mouth daily. 01/06/23   Yes Wieting, Richard, MD  rosuvastatin (CRESTOR) 10 MG tablet TAKE 1 TABLET BY MOUTH EVERY DAY 06/15/22  Yes End, [name], MD  sacubitril-valsartan (ENTRESTO) 24-26 MG Take 1 tablet by mouth 2 (two) times daily. Hold for SBP less than 100 01/05/23  Yes Wieting, Richard, MD  senna-docusate (SENOKOT-S) 8.6-50 MG tablet Take 1 tablet by mouth at bedtime as needed for mild constipation. 01/05/23  Yes Wieting, Richard, MD  sodium chloride flush (NS) 0.9 % SOLN 5 mLs by Intracatheter route every 8 (eight) hours. 01/05/23  Yes Wieting, Richard, MD  tamsulosin (FLOMAX) 0.4 MG CAPS capsule TAKE 1 CAPSULE BY MOUTH EVERY DAY 09/18/22  Yes [name], MD  furosemide (LASIX) 40 MG tablet Take 1 tablet (40 mg total) by mouth daily as needed (Shortness of breath, or weight gain of 3 pounds in a day or 5 pounds in a week.). Patient not taking: Reported on 01/07/2023 01/05/23   [name], MD     Family History  Problem Relation Age of Onset   Alzheimer's disease Mother    Breast cancer Mother        breast   Atrial fibrillation Mother    CAD Mother    Stroke Father 49   Diabetes Father    CAD Father    Colon polyps Sister    Colon polyps Brother    Rectal cancer Maternal Grandfather        rectal   Colon cancer Maternal Grandfather 88   Esophageal cancer Neg Hx    Stomach cancer Neg Hx     Social History   Socioeconomic History   Marital status: Married    Spouse name: Not on file   Number of children: Not on file   Years of education: Not on file   Highest education level: 12th grade  Occupational History   Not on file  Tobacco Use   Smoking status: Never    Passive exposure: Past   Smokeless tobacco: Former    Types: Chew    Quit date: 09/08/2001  Vaping Use   Vaping Use: Never used  Substance and Sexual Activity   Alcohol use: Not Currently    Comment: occ   Drug use: No   Sexual activity: Not Currently  Other Topics Concern   Not on file  Social History Narrative    Caffeine: occasional Lives with wife, grandson [name] 2009)Occupation: retired, 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.Diet: some water, fruits/vegetables daily, avoids potatoes   Social Determinants of Health   Financial Resource Strain: Low Risk  (12/26/2022)   Overall Financial Resource Strain (CARDIA)  Difficulty of Paying Living Expenses: Not hard at all  Food Insecurity: Patient Declined (12/26/2022)   Hunger Vital Sign    Worried About Running Out of Food in the Last Year: Patient declined    Ran Out of Food in the Last Year: Patient declined  Transportation Needs: No Transportation Needs (12/26/2022)   PRAPARE - Administrator, Civil Service (Medical): No    Lack of Transportation (Non-Medical): No  Physical Activity: Unknown (12/26/2022)   Exercise Vital Sign    Days of Exercise per Week: Patient declined    Minutes of Exercise per Session: Not on file  Stress: No Stress Concern Present (12/26/2022)   [employer] of Occupational Health - Occupational Stress Questionnaire    Feeling of Stress : Not at all  Social Connections: Unknown (12/26/2022)   Social Connection and Isolation Panel [NHANES]    Frequency of Communication with Friends and Family: Patient declined    Frequency of Social Gatherings with Friends and Family: Patient declined    Attends Religious Services: Patient declined    Database administrator or Organizations: Yes    Attends Banker Meetings: Patient declined    Marital Status: Married    Review of Systems: A 12 point ROS discussed and pertinent positives are indicated in the HPI above.  All other systems are negative.  Review of Systems  Constitutional:  Negative for fever.  HENT:  Negative for congestion.   Respiratory:  Negative for cough and shortness of breath.   Cardiovascular:  Negative for chest pain.  Gastrointestinal:  Positive for abdominal pain (right sided).  Neurological:  Negative for  headaches.  Psychiatric/Behavioral:  Negative for behavioral problems and confusion.     Vital Signs: BP 112/61 (BP Location: Right Arm)   Pulse 78   Temp 98 F (36.7 C) (Oral)   Resp 18   Ht 5\' 10"  (1.778 m)   Wt 215 lb (97.5 kg)   SpO2 98%   BMI 30.85 kg/m     Physical Exam Vitals and nursing note reviewed.  Constitutional:      Appearance: He is well-developed. He is obese.  HENT:     Head: Normocephalic.  Cardiovascular:     Rate and Rhythm: Normal rate and regular rhythm.  Pulmonary:     Effort: Pulmonary effort is normal.  Abdominal:     Comments: Positive RUQ  drain to  suction . Dressing is clean dry and intact. 10 ml of  serous colored fluid noted in  bulb suction device    Musculoskeletal:        General: Normal range of motion.     Cervical back: Normal range of motion.  Skin:    General: Skin is warm and dry.  Neurological:     General: No focal deficit present.     Mental Status: He is alert and oriented to person, place, and time.  Psychiatric:        Mood and Affect: Mood normal.        Behavior: Behavior normal.     Imaging: CT ABDOMEN PELVIS W CONTRAST  Result Date: 01/07/2023 CLINICAL DATA:  Right lower quadrant abdominal pain EXAM: CT ABDOMEN AND PELVIS WITH CONTRAST TECHNIQUE: Multidetector CT imaging of the abdomen and pelvis was performed using the standard protocol following bolus administration of intravenous contrast. RADIATION DOSE REDUCTION: This exam was performed according to the departmental dose-optimization program which includes automated exposure control, adjustment of the mA and/or kV according to  patient size and/or use of iterative reconstruction technique. CONTRAST:  OMNIPAQUE IOHEXOL 300 MG/ML  SOLN COMPARISON:  12/28/2022 FINDINGS: Lower chest: Large right pleural effusion with passive atelectasis. Borderline cardiomegaly. Mitral valve calcification. Coronary and descending thoracic aortic atherosclerotic vascular  calcification. Hepatobiliary: Markedly reduced size of the anterior perihepatic abscess with a pigtail drainage catheter in place. Residual fluid density in this vicinity proximally 6.2 by 1.4 by 1.0 cm (volume = 4.5 cm^3), formerly 15.8 by 7.1 by 9.8 cm (volume = 580 cm^3) prior to drain placement. Tiny locules of gas within the collection. Along the inferior right hepatic lobe margin, a new fluid collection with enhancing margins measures 7.4 by 1.2 by 8.8 cm (volume = 41 cm^3) and is suspicious for early perihepatic abscess formation. This is shown for example on image 52 series 5. Surrounding inflammatory stranding tracking along the right paracolic gutter region and right omentum as on image 48 series 2. Chronic relative atrophy in the lateral segment left hepatic lobe with some mild anterior hypoenhancement in the lateral segment which is chronic probably from remote scarring. The gallbladder is absent. Pancreas: Unremarkable Spleen: Unremarkable Adrenals/Urinary Tract: Stable nonobstructive right nephrolithiasis. Stable bilateral renal cysts not substantially changed over the last 10 days. No further imaging workup of these lesions is indicated. No hydronephrosis or hydroureter. The urinary bladder appears unremarkable. Stomach/Bowel: Small periampullary duodenal diverticulum. Vascular/Lymphatic: Atherosclerosis is present, including aortoiliac atherosclerotic disease. Atheromatous plaque noted at the origins of the celiac trunk and SMA without occlusion. Reproductive: Unremarkable Other: In addition to the inflammatory stranding along the right omentum and right paracolic gutter, there is asymmetric subcutaneous edema in the overlying soft tissues example on image 25 series 2, although no obvious connection between these different anatomic compartments. There is some cutaneous thickening in this region. Mild nonspecific presacral edema. Musculoskeletal: Mild lumbar spondylosis and degenerative disc disease.  IMPRESSION: 1. Markedly reduced size of the anterior perihepatic abscess with a pigtail drainage catheter in place. 2. Along the inferior right hepatic lobe margin, there is a new 41 cc fluid collection with enhancing margins suspicious for early perihepatic abscess formation. Adjacent abnormal infiltrative edema tracks along the paracolic gutter and omentum, and there is overlying subcutaneous edema and cutaneous thickening along the right abdomen. 3. Large right pleural effusion with passive atelectasis. 4. Stable nonobstructive right nephrolithiasis. Aortic Atherosclerosis (ICD10-I70.0). Electronically Signed   By: Gaylyn Rong M.D.   On: 01/07/2023 17:21   [place] EKG SITE RITE  Result Date: 01/02/2023 If Site Rite image not attached, placement could not be confirmed due to current cardiac rhythm.  DG Chest 2 View  Result Date: 01/01/2023 CLINICAL DATA:  Pleural effusion RIGHT EXAM: CHEST - 2 VIEW COMPARISON:  12/29/2022 FINDINGS: Upper normal size of cardiac silhouette post CABG. Mediastinal contours and pulmonary vascularity normal. Atherosclerotic calcification aorta. Bibasilar atelectasis and trace pleural effusions. Upper lungs clear. No pneumothorax or acute osseous findings. IMPRESSION: Bibasilar atelectasis and trace pleural effusions. Aortic Atherosclerosis (ICD10-I70.0). Electronically Signed   By: Ulyses Southward M.D.   On: 01/01/2023 10:51   ECHOCARDIOGRAM COMPLETE  Result Date: 12/30/2022    ECHOCARDIOGRAM REPORT   Patient Name:   [name] Young. Date of Exam: 12/30/2022 Medical Rec #:  [id number]            Height:       70.5 in Accession #:    [id number]           Weight:       217.0 lb  Date of Birth:  05-13-1941           BSA:          2.172 m Patient Age:    81 years             BP:           111/59 mmHg Patient Gender: M                    HR:           88 bpm. Exam Location:  [facility] Procedure: 2D Echo Indications:     Dyspnea R06.00                  Endocarditis I38  History:          Patient has prior history of Echocardiogram examinations, most                  recent 01/11/2022.  Sonographer:     Overton Mam RDCS, FASE Referring Phys:  [id number] AMY N [facility] Diagnosing Phys: Debbe Odea MD  Sonographer Comments: Technically difficult study due to poor echo windows and no subcostal window. Image acquisition challenging due to respiratory motion. IMPRESSIONS  1. Left ventricular ejection fraction, by estimation, is 50 to 55%. Left ventricular ejection fraction by PLAX is 50 %. The left ventricle has low normal function. The left ventricle has no regional wall motion abnormalities. Left ventricular diastolic parameters are consistent with Grade I diastolic dysfunction (impaired relaxation).  2. Right ventricular systolic function is normal. The right ventricular size is normal.  3. The mitral valve is degenerative. Mild mitral valve regurgitation. Moderate mitral annular calcification.  4. Aortic valve mean gradient 19 mmHg, peak gradient 35 mmHg, vmax 2.85m/s. The aortic valve is calcified. Aortic valve regurgitation is mild. Mild to moderate aortic valve stenosis.  5. Aortic dilatation noted. There is mild dilatation of the aortic root, measuring 40 mm. FINDINGS  Left Ventricle: Left ventricular ejection fraction, by estimation, is 50 to 55%. Left ventricular ejection fraction by PLAX is 50 %. The left ventricle has low normal function. The left ventricle has no regional wall motion abnormalities. The left ventricular internal cavity size was normal in size. There is no left ventricular hypertrophy. Left ventricular diastolic parameters are consistent with Grade I diastolic dysfunction (impaired relaxation). Right Ventricle: The right ventricular size is normal. No increase in right ventricular wall thickness. Right ventricular systolic function is normal. Left Atrium: Left atrial size was normal in size. Right Atrium: Right atrial size was normal in size. Pericardium: There is no evidence  of pericardial effusion. Mitral Valve: The mitral valve is degenerative in appearance. Moderate mitral annular calcification. Mild mitral valve regurgitation. MV peak gradient, 5.7 mmHg. The mean mitral valve gradient is 2.0 mmHg. Tricuspid Valve: The tricuspid valve is grossly normal. Tricuspid valve regurgitation is mild. Aortic Valve: Aortic valve mean gradient 19 mmHg, peak gradient 35 mmHg, vmax 2.57m/s. The aortic valve is calcified. Aortic valve regurgitation is mild. Aortic regurgitation PHT measures 638 msec. Mild to moderate aortic stenosis is present. Aortic valve  mean gradient measures 15.0 mmHg. Aortic valve peak gradient measures 28.0 mmHg. Aortic valve area, by VTI measures 2.83 cm. Pulmonic Valve: The pulmonic valve was normal in structure. Pulmonic valve regurgitation is mild. Aorta: Aortic dilatation noted. There is mild dilatation of the aortic root, measuring 40 mm. Venous: The inferior vena cava was not well visualized. IAS/Shunts: No atrial level shunt detected by color  flow Doppler.  LEFT VENTRICLE PLAX 2D LV EF:         Left            Diastology                ventricular     LV e' medial:    3.70 cm/s                ejection        LV E/e' medial:  18.0                fraction by     LV e' lateral:   12.50 cm/s                PLAX is 50      LV E/e' lateral: 5.3                %. LVIDd:         4.70 cm LVIDs:         3.50 cm LV PW:         1.20 cm LV IVS:        1.20 cm LVOT diam:     2.40 cm LV SV:         127 LV SV Index:   58 LVOT Area:     4.52 cm  LV Volumes (MOD) LV vol d, MOD    104.0 ml A4C: LV vol s, MOD    41.3 ml A4C: LV SV MOD A4C:   104.0 ml RIGHT VENTRICLE RV Basal diam:  2.60 cm RV S prime:     15.20 cm/s LEFT ATRIUM             Index        RIGHT ATRIUM           Index LA diam:        3.50 cm 1.61 cm/m   RA Area:     13.20 cm LA Vol (A2C):   35.2 ml 16.20 ml/m  RA Volume:   33.00 ml  15.19 ml/m LA Vol (A4C):   43.7 ml 20.12 ml/m LA Biplane Vol: 41.5 ml 19.10 ml/m   AORTIC VALVE                     PULMONIC VALVE AV Area (Vmax):    2.12 cm      PV Vmax:        1.78 m/s AV Area (Vmean):   2.00 cm      PV Peak grad:   12.7 mmHg AV Area (VTI):     2.83 cm      RVOT Peak grad: 2 mmHg AV Vmax:           264.67 cm/s AV Vmean:          179.333 cm/s AV VTI:            0.448 m AV Peak Grad:      28.0 mmHg AV Mean Grad:      15.0 mmHg LVOT Vmax:         124.00 cm/s LVOT Vmean:        79.200 cm/s LVOT VTI:          0.280 m LVOT/AV VTI ratio: 0.62 AI PHT:            638 msec  AORTA Ao Root diam: 4.00 cm MITRAL VALVE MV Area (PHT): 2.28 cm  SHUNTS MV Area VTI:   4.01 cm     Systemic VTI:  0.28 m MV Peak grad:  5.7 mmHg     Systemic Diam: 2.40 cm MV Mean grad:  2.0 mmHg MV Vmax:       1.19 m/s MV Vmean:      59.3 cm/s MV Decel Time: 333 msec MV E velocity: 66.60 cm/s MV A velocity: 119.00 cm/s MV E/A ratio:  0.56 Debbe Odea MD Electronically signed by Debbe Odea MD Signature Date/Time: 12/30/2022/4:15:35 PM    Final    IR THORACENTESIS ASP PLEURAL SPACE W/IMG GUIDE  Result Date: 12/29/2022 INDICATION: 82 year old with large perihepatic abscess and right pleural effusion. EXAM: 1. Ultrasound-guided placement of drainage catheter in the perihepatic abscess 2. Ultrasound-guided right thoracentesis MEDICATIONS: Moderate sedation ANESTHESIA/SEDATION: Moderate (conscious) sedation was employed during this procedure. A total of Versed 2mg  and fentanyl 50 mcg was administered intravenously at the order of the provider performing the procedure. Total intra-service moderate sedation time: 47 minutes. Patient's level of consciousness and vital signs were monitored continuously by radiology nurse throughout the procedure under the supervision of the provider performing the procedure. COMPLICATIONS: None immediate. PROCEDURE: Informed written consent was obtained from the patient after a thorough discussion of the procedural risks, benefits and alternatives. All questions were  addressed. Maximal Sterile Barrier Technique was utilized including caps, mask, sterile gowns, sterile gloves, sterile drape, hand hygiene and skin antiseptic. A timeout was performed prior to the initiation of the procedure. Right upper abdomen was evaluated with ultrasound. The large perihepatic abscess was identified. The right upper abdomen was prepped with chlorhexidine and sterile field was created. Skin was anesthetized using 1% lidocaine. Small incision was made. Using ultrasound guidance, Yueh catheter was directed into the large perihepatic abscess. Yellow purulent fluid was aspirated. Superstiff Amplatz wire was placed. The tract was dilated to accommodate a 12 [place] multipurpose drain. 550 mL of purulent fluid was removed. Drain was sutured to the skin and attached to a suction bulb. Fluid was sent for culture. The right posterior chest was evaluated with ultrasound. Right pleural fluid was identified with ultrasound. A new sterile tray was opened. Right posterior chest was prepped with chlorhexidine and a sterile field was created. Skin was anesthetized using 1% lidocaine. Small incision was made. Using ultrasound guidance, a Safe-T-Centesis catheter was directed into the right pleural space. Yellow fluid was aspirated. 900 mL of fluid was removed. Catheter was removed and a bandage was placed. FINDINGS: 550 mL of purulent fluid was removed from the perihepatic collection. Perihepatic abscess appeared to be decompressed at the end of the procedure. 900 mL of yellow fluid was removed from the right pleural space. IMPRESSION: 1. Ultrasound-guided placement of a drainage catheter in the large perihepatic abscess. 2. Ultrasound-guided right thoracentesis. Electronically Signed   By: Richarda Overlie M.D.   On: 12/29/2022 20:00   IR IMAGE GUIDED DRAINAGE BY PERCUTANEOUS CATHETER  Result Date: 12/29/2022 INDICATION: 82 year old with large perihepatic abscess and right pleural effusion. EXAM: 1.  Ultrasound-guided placement of drainage catheter in the perihepatic abscess 2. Ultrasound-guided right thoracentesis MEDICATIONS: Moderate sedation ANESTHESIA/SEDATION: Moderate (conscious) sedation was employed during this procedure. A total of Versed 2mg  and fentanyl 50 mcg was administered intravenously at the order of the provider performing the procedure. Total intra-service moderate sedation time: 47 minutes. Patient's level of consciousness and vital signs were monitored continuously by radiology nurse throughout the procedure under the supervision of the provider performing the procedure. COMPLICATIONS: None immediate. PROCEDURE: Informed  written consent was obtained from the patient after a thorough discussion of the procedural risks, benefits and alternatives. All questions were addressed. Maximal Sterile Barrier Technique was utilized including caps, mask, sterile gowns, sterile gloves, sterile drape, hand hygiene and skin antiseptic. A timeout was performed prior to the initiation of the procedure. Right upper abdomen was evaluated with ultrasound. The large perihepatic abscess was identified. The right upper abdomen was prepped with chlorhexidine and sterile field was created. Skin was anesthetized using 1% lidocaine. Small incision was made. Using ultrasound guidance, Yueh catheter was directed into the large perihepatic abscess. Yellow purulent fluid was aspirated. Superstiff Amplatz wire was placed. The tract was dilated to accommodate a 12 [place] multipurpose drain. 550 mL of purulent fluid was removed. Drain was sutured to the skin and attached to a suction bulb. Fluid was sent for culture. The right posterior chest was evaluated with ultrasound. Right pleural fluid was identified with ultrasound. A new sterile tray was opened. Right posterior chest was prepped with chlorhexidine and a sterile field was created. Skin was anesthetized using 1% lidocaine. Small incision was made. Using ultrasound  guidance, a Safe-T-Centesis catheter was directed into the right pleural space. Yellow fluid was aspirated. 900 mL of fluid was removed. Catheter was removed and a bandage was placed. FINDINGS: 550 mL of purulent fluid was removed from the perihepatic collection. Perihepatic abscess appeared to be decompressed at the end of the procedure. 900 mL of yellow fluid was removed from the right pleural space. IMPRESSION: 1. Ultrasound-guided placement of a drainage catheter in the large perihepatic abscess. 2. Ultrasound-guided right thoracentesis. Electronically Signed   By: Richarda Overlie M.D.   On: 12/29/2022 20:00   DG Chest Port 1 View  Result Date: 12/29/2022 CLINICAL DATA:  Post thoracentesis. EXAM: PORTABLE CHEST 1 VIEW COMPARISON:  12/28/2022 FINDINGS: Sternotomy wires unchanged. Lungs are adequately inflated with interval improvement and previously seen right pleural effusion as no residual pleural fluid is identified. No evidence of right-sided pneumothorax. Left lung is clear. Cardiomediastinal silhouette and remainder of the exam is unchanged. IMPRESSION: Interval improvement in previously seen right pleural effusion post thoracentesis. No pneumothorax. Electronically Signed   By: Elberta Fortis M.D.   On: 12/29/2022 16:02   CT Angio Chest PE W and/or Wo Contrast  Result Date: 12/28/2022 CLINICAL DATA:  Headache, short of breath, weakness, history of endocarditis, abdominal pain, nonlocalized right upper quadrant pain EXAM: CT ANGIOGRAPHY CHEST CT ABDOMEN AND PELVIS WITH CONTRAST TECHNIQUE: Multidetector CT imaging of the chest was performed using the standard protocol during bolus administration of intravenous contrast. Multiplanar CT image reconstructions and MIPs were obtained to evaluate the vascular anatomy. Multidetector CT imaging of the abdomen and pelvis was performed using the standard protocol during bolus administration of intravenous contrast. RADIATION DOSE REDUCTION: This exam was performed  according to the departmental dose-optimization program which includes automated exposure control, adjustment of the mA and/or kV according to patient size and/or use of iterative reconstruction technique. CONTRAST:  OMNIPAQUE IOHEXOL 350 MG/ML SOLN COMPARISON:  05/10/2022, 10/23/2021 FINDINGS: CTA CHEST FINDINGS Cardiovascular: This is a technically adequate evaluation of the pulmonary vasculature. No filling defects or pulmonary emboli. The heart is enlarged, with no evidence of pericardial effusion. There is calcification of the aortic and mitral valves. Normal caliber of the thoracic aorta. No evidence of aneurysm or dissection. Postsurgical changes from prior CABG. Atherosclerosis of the aorta and native coronary vessels. Mediastinum/Nodes: No enlarged mediastinal, hilar, or axillary lymph nodes. Thyroid gland, trachea,  and esophagus demonstrate no significant findings. Lungs/Pleura: There is a large right pleural effusion, volume estimated in excess of 1 L. minimal compressive atelectasis of the right lower lobe. No airspace disease or pneumothorax. Minimal mucoid material within the trachea. Otherwise the central airways are patent. Musculoskeletal: No acute or destructive bony lesions. Reconstructed images demonstrate no additional findings. Review of the MIP images confirms the above findings. CT ABDOMEN and PELVIS FINDINGS Hepatobiliary: Since the prior exam, the complex fluid collection along the ventral aspect of the liver has enlarged, measuring 15.8 x 8.6 by 9.8 cm on today's exam. Continued mural thickening with internal septations concerning for subcapsular or Peri hepatic abscess. This is amenable to percutaneous sampling if not previously performed. The remainder of the liver is unremarkable. Gallbladder is surgically absent. No biliary duct dilation. Pancreas: Unremarkable. No pancreatic ductal dilatation or surrounding inflammatory changes. Spleen: Normal in size without focal abnormality.  Adrenals/Urinary Tract: Stable bilateral renal cysts do not require specific imaging follow-up. Stable nonobstructing right renal calculi, measuring up to 5 mm. No obstructive uropathy within either kidney. The adrenals and bladder are unremarkable. Stomach/Bowel: No bowel obstruction or ileus. Diffuse colonic diverticulosis without evidence of diverticulitis. No bowel wall thickening. Vascular/Lymphatic: Aortic atherosclerosis. No enlarged abdominal or pelvic lymph nodes. Reproductive: Mild enlargement of the prostate unchanged. Other: No free fluid or free intraperitoneal gas. Evidence of prior umbilical hernia repair. Stable fat containing bilateral inguinal hernias. No bowel herniation. Musculoskeletal: No acute or destructive bony lesions. Reconstructed images demonstrate no additional findings. Review of the MIP images confirms the above findings. IMPRESSION: Chest: 1. No evidence of pulmonary embolus. 2. Large right pleural effusion, volume estimated in excess of 1 L. 3. Cardiomegaly. 4. Aortic and mitral valve calcifications. 5.  Aortic Atherosclerosis (ICD10-I70.0). Abdomen/pelvis: 1. Enlarging 15.8 x 8.6 x 9.8 cm complex fluid collection along the ventral margin of the liver, increased since prior exam and consistent with enlarging subcapsular or perihepatic abscess. This is amenable to percutaneous aspiration/drainage. 2. Nonobstructing right renal calculi. 3. Diffuse colonic diverticulosis without diverticulitis. 4.  Aortic Atherosclerosis (ICD10-I70.0). Electronically Signed   By: Sharlet Salina M.D.   On: 12/28/2022 14:57   CT ABDOMEN PELVIS W CONTRAST  Result Date: 12/28/2022 CLINICAL DATA:  Headache, short of breath, weakness, history of endocarditis, abdominal pain, nonlocalized right upper quadrant pain EXAM: CT ANGIOGRAPHY CHEST CT ABDOMEN AND PELVIS WITH CONTRAST TECHNIQUE: Multidetector CT imaging of the chest was performed using the standard protocol during bolus administration of  intravenous contrast. Multiplanar CT image reconstructions and MIPs were obtained to evaluate the vascular anatomy. Multidetector CT imaging of the abdomen and pelvis was performed using the standard protocol during bolus administration of intravenous contrast. RADIATION DOSE REDUCTION: This exam was performed according to the departmental dose-optimization program which includes automated exposure control, adjustment of the mA and/or kV according to patient size and/or use of iterative reconstruction technique. CONTRAST:  OMNIPAQUE IOHEXOL 350 MG/ML SOLN COMPARISON:  05/10/2022, 10/23/2021 FINDINGS: CTA CHEST FINDINGS Cardiovascular: This is a technically adequate evaluation of the pulmonary vasculature. No filling defects or pulmonary emboli. The heart is enlarged, with no evidence of pericardial effusion. There is calcification of the aortic and mitral valves. Normal caliber of the thoracic aorta. No evidence of aneurysm or dissection. Postsurgical changes from prior CABG. Atherosclerosis of the aorta and native coronary vessels. Mediastinum/Nodes: No enlarged mediastinal, hilar, or axillary lymph nodes. Thyroid gland, trachea, and esophagus demonstrate no significant findings. Lungs/Pleura: There is a large right pleural effusion, volume  estimated in excess of 1 L. minimal compressive atelectasis of the right lower lobe. No airspace disease or pneumothorax. Minimal mucoid material within the trachea. Otherwise the central airways are patent. Musculoskeletal: No acute or destructive bony lesions. Reconstructed images demonstrate no additional findings. Review of the MIP images confirms the above findings. CT ABDOMEN and PELVIS FINDINGS Hepatobiliary: Since the prior exam, the complex fluid collection along the ventral aspect of the liver has enlarged, measuring 15.8 x 8.6 by 9.8 cm on today's exam. Continued mural thickening with internal septations concerning for subcapsular or Peri hepatic abscess. This  is amenable to percutaneous sampling if not previously performed. The remainder of the liver is unremarkable. Gallbladder is surgically absent. No biliary duct dilation. Pancreas: Unremarkable. No pancreatic ductal dilatation or surrounding inflammatory changes. Spleen: Normal in size without focal abnormality. Adrenals/Urinary Tract: Stable bilateral renal cysts do not require specific imaging follow-up. Stable nonobstructing right renal calculi, measuring up to 5 mm. No obstructive uropathy within either kidney. The adrenals and bladder are unremarkable. Stomach/Bowel: No bowel obstruction or ileus. Diffuse colonic diverticulosis without evidence of diverticulitis. No bowel wall thickening. Vascular/Lymphatic: Aortic atherosclerosis. No enlarged abdominal or pelvic lymph nodes. Reproductive: Mild enlargement of the prostate unchanged. Other: No free fluid or free intraperitoneal gas. Evidence of prior umbilical hernia repair. Stable fat containing bilateral inguinal hernias. No bowel herniation. Musculoskeletal: No acute or destructive bony lesions. Reconstructed images demonstrate no additional findings. Review of the MIP images confirms the above findings. IMPRESSION: Chest: 1. No evidence of pulmonary embolus. 2. Large right pleural effusion, volume estimated in excess of 1 L. 3. Cardiomegaly. 4. Aortic and mitral valve calcifications. 5.  Aortic Atherosclerosis (ICD10-I70.0). Abdomen/pelvis: 1. Enlarging 15.8 x 8.6 x 9.8 cm complex fluid collection along the ventral margin of the liver, increased since prior exam and consistent with enlarging subcapsular or perihepatic abscess. This is amenable to percutaneous aspiration/drainage. 2. Nonobstructing right renal calculi. 3. Diffuse colonic diverticulosis without diverticulitis. 4.  Aortic Atherosclerosis (ICD10-I70.0). Electronically Signed   By: Sharlet Salina M.D.   On: 12/28/2022 14:57   CT HEAD WO CONTRAST ( )  Result Date: 12/28/2022 CLINICAL DATA:   Headache, shortness of breath, weakness, on Eliquis EXAM: CT HEAD WITHOUT CONTRAST TECHNIQUE: Contiguous axial images were obtained from the base of the skull through the vertex without intravenous contrast. RADIATION DOSE REDUCTION: This exam was performed according to the departmental dose-optimization program which includes automated exposure control, adjustment of the mA and/or kV according to patient size and/or use of iterative reconstruction technique. COMPARISON:  10/23/2021 FINDINGS: Brain: No evidence of acute infarction, hemorrhage, mass, mass effect, or midline shift. No hydrocephalus or extra-axial fluid collection. Periventricular white matter changes, likely the sequela of chronic small vessel ischemic disease. Cerebral volume is within normal limits for age. Vascular: No hyperdense vessel. Atherosclerotic calcifications in the intracranial carotid and vertebral arteries. Skull: Negative for fracture or focal lesion. Sinuses/Orbits: No acute finding. Status post bilateral lens replacements. Other: The mastoid air cells are well aerated. IMPRESSION: No acute intracranial process. Electronically Signed   By: Wiliam Ke M.D.   On: 12/28/2022 14:46   DG Chest 2 View  Result Date: 12/28/2022 CLINICAL DATA:  SOB EXAM: CHEST - 2 VIEW COMPARISON:  12/26/2022. FINDINGS: Enlarged cardiac silhouette. Small right-sided pleural effusion. No pneumothorax. Calcified aorta. Status post median sternotomy and CABG. IMPRESSION: Enlarged cardiac silhouette.  Small right-sided pleural effusion. Electronically Signed   By: Layla Maw M.D.   On: 12/28/2022 12:36   DG  Chest 2 View  Result Date: 12/26/2022 CLINICAL DATA:  Dyspnea. Pleural effusion post thoracentesis last week. EXAM: CHEST - 2 VIEW COMPARISON:  Radiographs 12/22/2022 and 12/21/2022.  CT 10/23/2021. FINDINGS: The heart size and mediastinal contours are stable status post median sternotomy and CABG. Small residual right pleural effusion without  significant recurrence. There is no pneumothorax, edema or confluent airspace opacity. The bones appear unchanged. IMPRESSION: Small residual right pleural effusion without significant recurrence. No acute cardiopulmonary process. Electronically Signed   By: Carey Bullocks M.D.   On: 12/26/2022 13:09   US THORACENTESIS ASP PLEURAL SPACE W/IMG GUIDE  Result Date: 12/22/2022 INDICATION: Right pleural effusion EXAM: ULTRASOUND GUIDED RIGHT THORACENTESIS MEDICATIONS: 8 cc 1% lidocaine COMPLICATIONS: None immediate. PROCEDURE: An ultrasound guided thoracentesis was thoroughly discussed with the patient and questions answered. The benefits, risks, alternatives and complications were also discussed. The patient understands and wishes to proceed with the procedure. Written consent was obtained. Ultrasound was performed to localize and mark an adequate pocket of fluid in the right chest. The area was then prepped and draped in the normal sterile fashion. 1% Lidocaine was used for local anesthesia. Under ultrasound guidance a 6 Fr Safe-T-Centesis catheter was introduced. Thoracentesis was performed. The catheter was removed and a dressing applied. FINDINGS: A total of approximately 1.0 L of cloudy yellow fluid was removed. Samples were sent to the laboratory as requested by the clinical team. IMPRESSION: Successful ultrasound guided right thoracentesis yielding 1.0 L of pleural fluid. Follow-up chest x-ray revealed no evidence of pneumothorax. Read by: Mina Marble, PA-C Electronically Signed   By: Judie Petit.  Shick M.D.   On: 12/22/2022 14:45   DG Chest 2 View  Result Date: 12/22/2022 CLINICAL DATA:  82 year old male with a history of right pleural effusion status post thoracentesis in February. Recurrence. EXAM: CHEST - 2 VIEW COMPARISON:  Post thoracentesis portable chest x-ray 10/13/2022 and earlier. FINDINGS: PA and lateral views on 12/21/2022. Previous CABG. Stable cardiac size and mediastinal contours. Visualized  tracheal air column is within normal limits. The left lung appears stable and clear. Small to moderate right pleural effusion appears to be layering, and is increased since February. No pneumothorax or pulmonary edema. No acute osseous abnormality identified. Negative visible bowel gas. IMPRESSION: 1. Small to moderate right pleural effusion, progressed since February. 2. No other acute cardiopulmonary abnormality. Electronically Signed   By: Odessa Fleming M.D.   On: 12/22/2022 14:25   DG Chest Port 1 View  Result Date: 12/22/2022 CLINICAL DATA:  82 year old male status post ultrasound guided right side thoracentesis this afternoon. EXAM: PORTABLE CHEST 1 VIEW COMPARISON:  Ultrasound thoracentesis images 13 38 hours. Two view chest yesterday and earlier. FINDINGS: Portable AP upright view at 1410 hours. Substantially regressed, nearly resolved right pleural effusion. No pneumothorax. Stable cardiac size and mediastinal contours. Prior CABG. The left lung remains clear when allowing portable technique. No acute osseous abnormality identified. Paucity of bowel gas in the visible abdomen. IMPRESSION: Nearly resolved right pleural effusion following thoracentesis. No pneumothorax or other acute cardiopulmonary abnormality. Electronically Signed   By: Odessa Fleming M.D.   On: 12/22/2022 14:23    Labs:  CBC: Recent Labs    01/02/23 0429 01/05/23 0430 01/07/23 1541 01/08/23 0440  WBC 13.2* 10.2 7.9 7.9  HGB 10.3* 9.7* 10.0* 9.2*  HCT 33.3* 31.4* 33.0* 30.0*  PLT 525* 469* 441* 440*    COAGS: Recent Labs    12/29/22 0329 01/08/23 0440  INR 1.6* 1.5*  APTT  --  45*    BMP: Recent Labs    01/02/23 0429 01/04/23 1600 01/07/23 1541 01/08/23 0440  NA 140 137 136 138  K 4.0 4.1 4.2 4.0  CL 108 106 104 105  CO2 24 25 27 27   GLUCOSE 114* 145* 124* 101*  BUN 36* 28* 21 18  CALCIUM 8.2* 7.9* 8.1* 8.1*  CREATININE 1.18 0.85 0.95 0.88  GFRNONAA >60 >60 >60 >60    LIVER FUNCTION TESTS: Recent Labs     12/28/22 1135 12/28/22 1233 01/07/23 1541 01/08/23 0440  BILITOT 0.6 0.4 0.4 0.5  AST 32 26 75* 56*  ALT 15 11 42 36  ALKPHOS 65 74 62 58  PROT 6.8 6.1 5.3* 5.4*  ALBUMIN 2.5* 2.9* 2.1* 2.0*     Assessment and Plan:  82 y.o. male outpatient (In the ED). Known to IR. History of DM, CAD s/p CABG X 3, HTN, HLD, CHF, GERD, diverticulosis, cardiomyopathy, BPH. Perihepatic abscess s/p drain placement on 4.19.24 by IR Attending Dr. Richarda Overlie. Cultures from the abscess grew E. Coli. Patient presented to the ED at [facility] on 4.28.24 with worsening right sided abdominal pain.  CT Abd pelvis from 4.28.24 reads Markedly reduced size of the anterior perihepatic abscess with a pigtail drainage catheter in place. 2. Along the inferior right hepatic lobe margin, there is a new 41 cc fluid collection with enhancing margins suspicious for early perihepatic abscess formation. Adjacent abnormal infiltrative edema tracks along the paracolic gutter and omentum, and there is overlying subcutaneous edema and cutaneous thickening along the right abdomen. Team is requesting an perihepatic abscess drain  reposition possible placement  No leukocytosis, INR 1.5, Albumin 2.0 AST 56. Eliquis is listed as a home medication. NKDA. Family concerned about pain control. Discussed with IR Attending. Okay to attempt procedure with conscious sedation. Patient has been NPO since midnight.   Risks and benefits discussed with the patient and his wife including bleeding, infection, damage to adjacent structures, bowel perforation/fistula connection, and sepsis.  All of the patient's and his wife's questions were answered, patient is agreeable to proceed.  Consent signed and in IR control room  Thank you for this interesting consult.  I greatly enjoyed meeting Wilferd Ritson. and look forward to participating in their care.  A copy of this report was sent to the requesting provider on this date.  Electronically Signed: Alene Mires, NP 01/08/2023, 7:32 AM   I spent a total of 40 Minutes   in face to face in clinical consultation, greater than 50% of which was counseling/coordinating care for hepatic abscess drain reposition/ placement

## 2023-01-08 NOTE — Assessment & Plan Note (Addendum)
Patient has a history of heart disease and chronic diastolic congestive heart failure are stable. Pt has LE edema of 2 + but no JVD or SOB. Currently we will hold entreso / lasix/ farxiga  because of low blood pressure.  D/C farxiga as pt has yeast infection.

## 2023-01-09 DIAGNOSIS — R1011 Right upper quadrant pain: Secondary | ICD-10-CM | POA: Diagnosis not present

## 2023-01-09 DIAGNOSIS — K750 Abscess of liver: Secondary | ICD-10-CM

## 2023-01-09 LAB — CBC
HCT: 29.3 % — ABNORMAL LOW (ref 39.0–52.0)
Hemoglobin: 9.3 g/dL — ABNORMAL LOW (ref 13.0–17.0)
MCH: 27.7 pg (ref 26.0–34.0)
MCHC: 31.7 g/dL (ref 30.0–36.0)
MCV: 87.2 fL (ref 80.0–100.0)
Platelets: 379 10*3/uL (ref 150–400)
RBC: 3.36 MIL/uL — ABNORMAL LOW (ref 4.22–5.81)
RDW: 15.9 % — ABNORMAL HIGH (ref 11.5–15.5)
WBC: 5.1 10*3/uL (ref 4.0–10.5)
nRBC: 0 % (ref 0.0–0.2)

## 2023-01-09 LAB — GLUCOSE, CAPILLARY
Glucose-Capillary: 79 mg/dL (ref 70–99)
Glucose-Capillary: 116 mg/dL — ABNORMAL HIGH (ref 70–99)
Glucose-Capillary: 122 mg/dL — ABNORMAL HIGH (ref 70–99)
Glucose-Capillary: 110 mg/dL — ABNORMAL HIGH (ref 70–99)

## 2023-01-09 LAB — AEROBIC/ANAEROBIC CULTURE W GRAM STAIN (SURGICAL/DEEP WOUND)

## 2023-01-09 LAB — BASIC METABOLIC PANEL
Anion gap: 6 (ref 5–15)
BUN: 13 mg/dL (ref 8–23)
CO2: 26 mmol/L (ref 22–32)
Calcium: 8 mg/dL — ABNORMAL LOW (ref 8.9–10.3)
Chloride: 106 mmol/L (ref 98–111)
Creatinine, Ser: 0.91 mg/dL (ref 0.61–1.24)
GFR, Estimated: >60 mL/min (ref >60)
Glucose, Bld: 90 mg/dL (ref 70–99)
Potassium: 3.7 mmol/L (ref 3.5–5.1)
Sodium: 138 mmol/L (ref 135–145)

## 2023-01-09 LAB — MAGNESIUM: Magnesium: 1.8 mg/dL (ref 1.7–2.4)

## 2023-01-09 MED ORDER — APIXABAN 5 MG PO TABS
5.0000 mg | ORAL_TABLET | Freq: Two times a day (BID) | ORAL | Status: DC
  Administered 2023-01-09 – 2023-01-12 (×6): 5 mg via ORAL
  Filled 2023-01-09 (×6): qty 1

## 2023-01-09 NOTE — Consult Note (Addendum)
[facility]    Date of Admission:  01/07/2023   Total days of inpatient antibiotics 2        Reason for Consult: Liver abscess    Principal Problem:   Abdominal pain Active Problems:   Type 2 diabetes mellitus with other specified complication (HCC)   GERD (gastroesophageal reflux disease)   CKD stage 3 due to type 2 diabetes mellitus (HCC)   Advanced care planning/counseling discussion   Coronary artery disease   Anemia   Paroxysmal atrial fibrillation (HCC)   Bacteremia due to Escherichia coli   Recurrent right pleural effusion   Hepatic abscess   Chronic diastolic CHF (congestive heart failure) (HCC)   Decubitus ulcer of right buttock, stage 2 (HCC)   Assessment: 82 year old male with recent admission for liver abscess discharged on ceftriaxone x 4 weeks EOT 5/16 presented with abdominal pain, right-sided and rash on the right side found to have new liver abscess.  #Liver abscesses status post drain exchange and aspiration #Right-sided pleural effusion likely secondary to above - CT showed reduced size of perihepatic abscess with pigtail drainage catheter, new 41 cc fluid collection suspicious for perihepatic abscess, infiltrated edema tracks along paracolic gutter and omentum and there is overlying subcutaneous edema and cutaneous thickening along the right abdomen.  Large right pleural effusion with passive atelectasis - Patient is on pip-tazo.  No leukocytosis, vital stable.  Notes only 1 missed dose of ceftriaxone, so she think it is okay to continue pip-tazo. - Patient underwent CT-guided aspiration subhepatic collection, noted 2ml thin yellow fluid, sent for cultures.  Also underwent CT-guided thoracentesis, noted 900 mL clear straw colored fluid. Recommendations: - Obtain blood cultures -Follow aspirate cultures - Okay to continue pip-tazo for now.  #History of Streptococcus and E. coli bacteremia February 2023 due to ascending  cholangitis from choledocholithiasis status post ERCP on 11/01/2021 complicated by mitral valve endocarditis treated with cefazolin x 6 weeks  #Poor dentition - Consult follow-up with dentistry  #Buttock wound - Wound care - Okay to continue fluconazole per primary  Dr. Elinor Young will be covering tomorrow I have personally spent 98 minutes involved in face-to-face and non-face-to-face activities for this patient on the day of the visit. Professional time spent includes the following activities: Preparing to see the patient (review of tests), Obtaining and/or reviewing separately obtained history (admission/discharge record), Performing a medically appropriate examination and/or evaluation , Ordering medications/tests/procedures, referring and communicating with other health care professionals, Documenting clinical information in the EMR, Independently interpreting results (not separately reported), Communicating results to the patient/family/caregiver, Counseling and educating the patient/family/caregiver and Care coordination (not separately reported).   Microbiology:   Antibiotics: Fluconazole Metronidazole Pip-tazo, vancomycin  Cultures: 4/29, CT-guided aspiration with tube change   HPI: [name] Young. is a 82 y.o. male with history of of transient and recurrent A-fib, diverticulosis, three-vessel CAD status post CABG admitted 2019, hypertension, hyperlipidemia, cardiomyopathy, Streptococcus and E. coli bacteremia February 2023 due to ascending cholangitis from choledocholithiasis status post ERCP on 11/01/2021 complicated by mitral valve endocarditis treated with cefazolin x 6 weeks with recent admission on 4/19-26 to [place] for liver abscess status post cement with cultures growing E. coli discharged on ceftriaxone x 4 weeks presented again with pain/rash on right side of his abdomen and right flank plain with bloody drainage.  On arrival vital stable WBC 7.9K.  CT abdomen pelvis  showed reduced size of perihepatic abscess with pigtail drainage catheter, new  41 cc fluid collection suspicious for perihepatic abscess, infiltrated edema tracks along paracolic gutter and omentum and there is overlying subcutaneous edema and cutaneous thickening along the right abdomen.  Large right pleural effusion with passive atelectasis   Review of Systems: Review of Systems  All other systems reviewed and are negative.   Past Medical History:  Diagnosis Date   Adenomatous colon polyp    Aortic atherosclerosis (HCC)    Arthritis    Ascending aorta dilatation (HCC)    a.) TTE 11/23/2017: asc Ao measured 37 mm; b.) TTE 10/23/2021: Ao root measured 41 mm, asc Ao measured 38 mm; c.) TEE 10/28/2021: asc Ao measured 38 mm; d.) TTE 01/11/2022: Ao root 40 mm, asc Ao 39 mm   Ascending cholangitis 10/2021   BPH (benign prostatic hyperplasia)    Cardiomyopathy (HCC)    CKD (chronic kidney disease), stage III (HCC)    Claustrophobia    Coronary artery disease    a.) LHC 12/21/2017: 75% mLAD, 90% D1, 80-99% OM1/2, CTO pRCA (L-R collaterals) --> CVTS consult. 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   In setting of bacteremia from ascending colangitis   GERD (gastroesophageal reflux disease)    HFrEF (heart failure with reduced ejection fraction) (HCC)    a.) TTE 11/23/2017: EF 50-55%, mod MAC, triv TR, G1DD; b.) TTE 10/23/2021: EF 40-45%, mild LAE, Ao sclerosis, triv MR, G2DD; c.) TEE 10/28/2021: EF 40-45%, glob HK, mobile vegitation on MV; d.) TTE 01/11/2022: EF 50-55%, mild LVH, RVE, Ao sclerosis, mild MR/AR, G1DD   History of cholelithiasis    History of kidney stones    ca ox [name] Young @ [facility]) now [facility] [facility]   History of pneumonia    HLD (hyperlipidemia)    HTN (hypertension)    Jaundice    age 81   Long term current use of anticoagulant    a.) apixaban   Paroxysmal atrial fibrillation (HCC)    a.) CHA2DS2VASc = 6 (age x 2,  HFrEF, HTN, vascular disease history, T2DM);  b.) rate/rhythm maintained on oral metoprolol succinate; chronically anticoagulated with apixaban   Pneumonia    Right-sided carotid artery disease (HCC)    a.) carotid doppler 04/05/2022: 1-39% RICA   S/P CABG x 3    a.) LIMA-LAD, SVG-diagonal, SVG-PL branch of RCA   S/P cataract extraction and insertion of intraocular lens    T2DM (type 2 diabetes mellitus) (HCC) 2010    Social History   Tobacco Use   Smoking status: Never    Passive exposure: Past   Smokeless tobacco: Former    Types: Chew    Quit date: 09/08/2001  Vaping Use   Vaping Use: Never used  Substance Use Topics   Alcohol use: Not Currently    Comment: occ   Drug use: No    Family History  Problem Relation Age of Onset   Alzheimer's disease Mother    Breast cancer Mother        breast   Atrial fibrillation Mother    CAD Mother    Stroke Father 82   Diabetes Father    CAD Father    Colon polyps Sister    Colon polyps Brother    Rectal cancer Maternal Grandfather        rectal   Colon cancer Maternal Grandfather 18   Esophageal cancer Neg Hx    Stomach cancer Neg Hx    Scheduled Meds:  Chlorhexidine Gluconate  Cloth  6 each Topical Daily   enoxaparin (LOVENOX) injection  40 mg Subcutaneous Q24H   feeding supplement  237 mL Oral BID BM   mirtazapine  7.5 mg Oral QHS   pantoprazole  40 mg Oral Daily   rosuvastatin  10 mg Oral Daily   sodium chloride flush  3 mL Intravenous Q12H   tamsulosin  0.4 mg Oral Daily   Continuous Infusions:  sodium chloride 50 mL/hr at 01/08/23 1836   piperacillin-tazobactam (ZOSYN)  IV 3.375 g (01/09/23 0601)   PRN Meds:.acetaminophen **OR** acetaminophen, diphenhydrAMINE, hydrALAZINE, nitroGLYCERIN, oxyCODONE-acetaminophen No Known Allergies  OBJECTIVE: Blood pressure (!) 132/55, pulse 80, temperature 97.7 F (36.5 C), resp. rate 18, height 5\' 10"  (1.778 m), weight 98.8 kg, SpO2 98 %.  Physical Exam Constitutional:       General: He is not in acute distress.    Appearance: He is normal weight. He is not toxic-appearing.  HENT:     Head: Normocephalic and atraumatic.     Right Ear: External ear normal.     Left Ear: External ear normal.     Nose: No congestion or rhinorrhea.     Mouth/Throat:     Mouth: Mucous membranes are moist.     Pharynx: Oropharynx is clear.  Eyes:     Extraocular Movements: Extraocular movements intact.     Conjunctiva/sclera: Conjunctivae normal.     Pupils: Pupils are equal, round, and reactive to light.  Cardiovascular:     Rate and Rhythm: Normal rate and regular rhythm.     Heart sounds: No murmur heard.    No friction rub. No gallop.  Pulmonary:     Effort: Pulmonary effort is normal.     Breath sounds: Normal breath sounds.  Abdominal:     General: Abdomen is flat. Bowel sounds are normal.     Palpations: Abdomen is soft.  Musculoskeletal:        General: No swelling. Normal range of motion.     Cervical back: Normal range of motion and neck supple.  Skin:    General: Skin is warm and dry.  Neurological:     General: No focal deficit present.     Mental Status: He is oriented to person, place, and time.  Psychiatric:        Mood and Affect: Mood normal.     Lab Results Lab Results  Component Value Date   WBC 5.1 01/09/2023   HGB 9.3 (L) 01/09/2023   HCT 29.3 (L) 01/09/2023   MCV 87.2 01/09/2023   PLT 379 01/09/2023    Lab Results  Component Value Date   CREATININE 0.91 01/09/2023   BUN 13 01/09/2023   NA 138 01/09/2023   K 3.7 01/09/2023   CL 106 01/09/2023   CO2 26 01/09/2023    Lab Results  Component Value Date   ALT 36 01/08/2023   AST 56 (H) 01/08/2023   ALKPHOS 58 01/08/2023   BILITOT 0.5 01/08/2023       Danelle Earthly, MD [facility] 01/09/2023, 2:22 PM

## 2023-01-09 NOTE — Evaluation (Signed)
Occupational Therapy Evaluation Patient Details Name: [name] Young. MRN: [id number] DOB: 27-Jun-1941 Today's Date: 01/09/2023   History of Present Illness [name] Young. is a 82 y.o. male recently diagnosed hepatic abscess who comes in with worsening right-sided swelling, right-sided pain. PMH includes: BPH, CKD III, CAD, HLD, HTN, AFIB, & T2DM.   Clinical Impression   [name] was seen for OT evaluation this date. Pt presents to acute OT services with limited functional independence during ADL management 2/2 increased abdominal pain, decreased balance, and generalized weakness (see OT problem list for additional functional deficits). Pt requires SUPERVISION for safety with bed/functional mobility in room. Anticipate MIN A for LB ADL management. Pt would benefit from skilled OT services to address noted impairments and functional limitations (see below for any additional details) in order to maximize safety and independence while minimizing falls risk and caregiver burden. Anticipate the need for ongoing therapy services upon hospital DC (<3 hours/day).     Recommendations for follow up therapy are one component of a multi-disciplinary discharge planning process, led by the attending physician.  Recommendations may be updated based on patient status, additional functional criteria and insurance authorization.   Assistance Recommended at Discharge Frequent or constant Supervision/Assistance  Patient can return home with the following A little help with walking and/or transfers;A little help with bathing/dressing/bathroom;Assistance with cooking/housework;Assist for transportation;Help with stairs or ramp for entrance;Direct supervision/assist for medications management    Functional Status Assessment  Patient has had a recent decline in their functional status and demonstrates the ability to make significant improvements in function in a reasonable and predictable amount of time.   Equipment Recommendations  BSC/3in1    Recommendations for Other Services       Precautions / Restrictions Precautions Precautions: Fall Precaution Comments: RUQ abdominal/perihepatic JP drain Restrictions Weight Bearing Restrictions: No      Mobility Bed Mobility Overal bed mobility: Needs Assistance Bed Mobility: Supine to Sit     Supine to sit: Supervision, HOB elevated     General bed mobility comments: Increased time/effort to perform.    Transfers Overall transfer level: Needs assistance Equipment used: Rolling walker (2 wheels) Transfers: Sit to/from Stand Sit to Stand: Supervision, From elevated surface           General transfer comment: from elevated bed height.      Balance Overall balance assessment: Needs assistance Sitting-balance support: No upper extremity supported, Feet supported Sitting balance-Leahy Scale: Good Sitting balance - Comments: steady sitting reaching within BOS   Standing balance support: Bilateral upper extremity supported, During functional activity Standing balance-Leahy Scale: Fair Standing balance comment: heavy reliance on RW                           ADL either performed or assessed with clinical judgement   ADL Overall ADL's : Needs assistance/impaired Eating/Feeding: Sitting;Independent   Grooming: Set up;Supervision/safety;Sitting                               Functional mobility during ADLs: Supervision/safety;Rolling walker (2 wheels) General ADL Comments: Pt is functionally limited by generalized weakness, decreased activity tolerance, decreased safety awareness, and decreased balance. He requires SUPERVISION for safety with bed/functional mobility. Pt declines further ADL management during session. Anticipate SET UP assit for sitting UB ADL management.     Vision Patient Visual Report: No change from baseline  Perception     Praxis      Pertinent Vitals/Pain Pain  Assessment Pain Assessment: No/denies pain Faces Pain Scale: Hurts whole lot Pain Location: buttocks, drain site. Pain Descriptors / Indicators: Sore, Guarding, Grimacing Pain Intervention(s): Limited activity within patient's tolerance, Monitored during session, Repositioned, Utilized relaxation techniques     Hand Dominance Right   Extremity/Trunk Assessment Upper Extremity Assessment Upper Extremity Assessment: Generalized weakness   Lower Extremity Assessment Lower Extremity Assessment: Generalized weakness       Communication Communication Communication: No difficulties   Cognition Arousal/Alertness: Awake/alert Behavior During Therapy: WFL for tasks assessed/performed Overall Cognitive Status: Within Functional Limits for tasks assessed                                       General Comments       Exercises Other Exercises Other Exercises: Pt educated on role of OT in acute setting, safe transfer technique, and falls prevention for home and hospital.   Shoulder Instructions      Home Living Family/patient expects to be discharged to:: Skilled nursing facility Living Arrangements: Spouse/significant other Available Help at Discharge: Family;Available 24 hours/day Type of Home: House Home Access: Stairs to enter [employer] of Steps: 3 Entrance Stairs-Rails: Right Home Layout: One level     Bathroom Shower/Tub: Chief Strategy Officer: Handicapped height     Home Equipment: Agricultural consultant (2 wheels);Cane - single point;Hand held shower head;Tub bench;Other (comment)          Prior Functioning/Environment Prior Level of Function : Needs assist       Physical Assist : Mobility (physical) Mobility (physical): Gait;Transfers   Mobility Comments: using RW at STR          OT Problem List: Decreased strength;Decreased activity tolerance;Impaired balance (sitting and/or standing);Decreased knowledge of use of DME  or AE;Cardiopulmonary status limiting activity      OT Treatment/Interventions: Self-care/ADL training;Therapeutic exercise;Therapeutic activities;DME and/or AE instruction;Patient/family education;Balance training;Energy conservation    OT Goals(Current goals can be found in the care plan section) Acute Rehab OT Goals Patient Stated Goal: To feel better OT Goal Formulation: With patient/family Time For Goal Achievement: 01/13/23 Potential to Achieve Goals: Good ADL Goals Pt Will Perform Grooming: sitting;with set-up;with supervision;standing Pt Will Perform Lower Body Dressing: sit to/from stand;with supervision;with set-up Pt Will Transfer to Toilet: ambulating;with modified independence;bedside commode Pt Will Perform Toileting - Clothing Manipulation and hygiene: sit to/from stand;with modified independence  OT Frequency: Min 3X/week    Co-evaluation              AM-PAC OT "6 Clicks" Daily Activity     Outcome Measure Help from another person eating meals?: None Help from another person taking care of personal grooming?: A Little Help from another person toileting, which includes using toliet, bedpan, or urinal?: A Little Help from another person bathing (including washing, rinsing, drying)?: A Little Help from another person to put on and taking off regular upper body clothing?: A Little Help from another person to put on and taking off regular lower body clothing?: A Little 6 Click Score: 19   End of Session Equipment Utilized During Treatment: Rolling walker (2 wheels) Nurse Communication: Mobility status;Patient requests pain meds  Activity Tolerance: Patient limited by fatigue;Patient limited by pain Patient left: in bed;with call bell/phone within reach;with nursing/sitter in room;with family/visitor present  OT Visit Diagnosis: Unsteadiness on feet (  R26.81)                Time: 0210-0222 OT Time Calculation (min): 12 min Charges:  OT General Charges $OT Visit:  1 Visit OT Evaluation $OT Eval Moderate Complexity: 1 Mod  Rockney Ghee, M.S., OTR/L 01/09/23, 3:28 PM

## 2023-01-09 NOTE — TOC Initial Note (Addendum)
Transition of Care (TOC) - Initial/Assessment Note    Patient Details  Name: [name] Young. MRN: [id number] Date of Birth: 05/23/1941  Transition of Care [facility]) CM/SW Contact:    Chapman Fitch, RN Phone Number: 01/09/2023, 4:01 PM  Clinical Narrative:                 Patient admitted from [facility] [place] aware of admission Patient and wife [name] Young confirm plan to return to [facility] at time of discharge  Fl2 sent for signature MD aware that patient will need new insurance prior to discharge    Auth started    Patient Goals and CMS Choice            Expected Discharge Plan and Services                                              Prior Living Arrangements/Services                       Activities of Daily Living Home Assistive Devices/Equipment: Cane (specify quad or straight), Walker (specify type), Eyeglasses, Hearing aid ADL Screening (condition at time of admission) Patient's cognitive ability adequate to safely complete daily activities?: Yes Is the patient deaf or have difficulty hearing?: Yes Does the patient have difficulty seeing, even when wearing glasses/contacts?: No Does the patient have difficulty concentrating, remembering, or making decisions?: No Patient able to express need for assistance with ADLs?: Yes Does the patient have difficulty dressing or bathing?: Yes Independently performs ADLs?: No Communication: Independent Dressing (OT): Needs assistance Grooming: Needs assistance Feeding: Independent Bathing: Needs assistance Is this a change from baseline?: Pre-admission baseline Toileting: Needs assistance Is this a change from baseline?: Pre-admission baseline In/Out Bed: Needs assistance Is this a change from baseline?: Pre-admission baseline Walks in Home: Needs assistance Is this a change from baseline?: Pre-admission baseline Does the patient have difficulty walking or climbing stairs?:  Yes Weakness of Legs: Both Weakness of Arms/Hands: None  Permission Sought/Granted                  Emotional Assessment              Admission diagnosis:  Pleural effusion [J90] Perihepatic abscess (HCC) [K65.0] Abdominal pain [R10.9] Patient Active Problem List   Diagnosis Date Noted   Abdominal pain 01/07/2023   Impaired fasting glucose 01/05/2023   Decubitus ulcer of right buttock, stage 2 (HCC) 01/04/2023   Moderate aortic stenosis 01/02/2023   Chronic diastolic CHF (congestive heart failure) (HCC) 01/02/2023   Metabolic acidosis 12/30/2022   Reactive thrombocytosis 12/29/2022   Malaise 12/28/2022   History of endocarditis 12/28/2022   Hepatic abscess 12/28/2022   Protein-calorie malnutrition, severe (HCC) 12/28/2022   Abnormal TSH 12/27/2022   Irregular heart rate 12/26/2022   Recurrent right pleural effusion 12/21/2022   Persistent cough for 3 weeks or longer 10/03/2022   Pre-op evaluation 05/03/2022   Liver mass, right lobe 05/03/2022   CCC (chronic calculous cholecystitis) 04/25/2022   History of acute cholangitis 04/25/2022   Infective endocarditis of mitral valve 11/12/2021   Palliative care by specialist    Bacteremia due to Escherichia coli 10/24/2021   Bacteremia due to Streptococcus 10/24/2021   Vitamin B12 deficiency 05/24/2020   Vitamin D deficiency 05/24/2020   Early dry stage nonexudative age-related macular  degeneration of both eyes 12/01/2019   Pain due to onychomycosis of toenails of both feet 07/28/2019   Diabetic neuropathy (HCC) 07/28/2019   Hearing loss, right 05/22/2019   Paroxysmal atrial fibrillation (HCC) 02/08/2018   FTT (failure to thrive) in adult 01/28/2018   Anemia    S/P CABG x 3 01/18/2018   Coronary artery disease 12/29/2017   Chronic inactive rheumatic heart disease 11/24/2017   DNR (do not resuscitate) 06/14/2017   Peripheral neuropathy 06/14/2017   Tinea cruris 04/02/2017   Health maintenance examination  12/12/2016   Obesity (BMI 30-39.9) 12/12/2016   Advanced care planning/counseling discussion 10/30/2014   Benign prostatic hyperplasia 10/30/2014   CKD stage 3 due to type 2 diabetes mellitus (HCC) 07/08/2013   Tinea corporis 01/16/2013   Medicare annual wellness visit, subsequent 10/11/2011   Type 2 diabetes mellitus with other specified complication (HCC)    Hyperlipidemia associated with type 2 diabetes mellitus (HCC)    History of kidney stones    GERD (gastroesophageal reflux disease)    PCP:  [name], MD Pharmacy:   [facility] [phone] [place], [place] - [address] [place] [address] Phone: [phone] Fax: [phone]     Social Determinants of Health (SDOH) Social History: SDOH Screenings   Food Insecurity: Patient Declined (12/26/2022)  Housing: Low Risk  (12/26/2022)  Transportation Needs: No Transportation Needs (12/26/2022)  Depression (PHQ2-9): Medium Risk (12/26/2022)  Financial Resource Strain: Low Risk  (12/26/2022)  Physical Activity: Unknown (12/26/2022)  Social Connections: Unknown (12/26/2022)  Stress: No Stress Concern Present (12/26/2022)  Tobacco Use: Medium Risk (01/08/2023)   SDOH Interventions:     Readmission Risk Interventions     No data to display

## 2023-01-09 NOTE — [place] FL2 (Signed)
[place] MEDICAID FL2 LEVEL OF CARE FORM     IDENTIFICATION  Patient Name: [name] Young. Birthdate: 09-Sep-1941 Sex: male Admission Date (Current Location): 01/07/2023  [facility] [place] Number:  Chiropodist and Address:  [facility], [address] [place], [place] [address]      Provider Number: [id number]  Attending Physician Name and Address:  Darlin Priestly, MD  Relative Name and Phone Number:       Current Level of Care: Hospital Recommended Level of Care: Skilled Nursing Facility Prior Approval Number:    Date Approved/Denied:   PASRR Number: [id number]  Discharge Plan: SNF    Current Diagnoses: Patient Active Problem List   Diagnosis Date Noted   Abdominal pain 01/07/2023   Impaired fasting glucose 01/05/2023   Decubitus ulcer of right buttock, stage 2 (HCC) 01/04/2023   Moderate aortic stenosis 01/02/2023   Chronic diastolic CHF (congestive heart failure) (HCC) 01/02/2023   Metabolic acidosis 12/30/2022   Reactive thrombocytosis 12/29/2022   Malaise 12/28/2022   History of endocarditis 12/28/2022   Hepatic abscess 12/28/2022   Protein-calorie malnutrition, severe (HCC) 12/28/2022   Abnormal TSH 12/27/2022   Irregular heart rate 12/26/2022   Recurrent right pleural effusion 12/21/2022   Persistent cough for 3 weeks or longer 10/03/2022   Pre-op evaluation 05/03/2022   Liver mass, right lobe 05/03/2022   CCC (chronic calculous cholecystitis) 04/25/2022   History of acute cholangitis 04/25/2022   Infective endocarditis of mitral valve 11/12/2021   Palliative care by specialist    Bacteremia due to Escherichia coli 10/24/2021   Bacteremia due to Streptococcus 10/24/2021   Vitamin B12 deficiency 05/24/2020   Vitamin D deficiency 05/24/2020   Early dry stage nonexudative age-related macular degeneration of both eyes 12/01/2019   Pain due to onychomycosis of toenails of both feet 07/28/2019   Diabetic neuropathy  (HCC) 07/28/2019   Hearing loss, right 05/22/2019   Paroxysmal atrial fibrillation (HCC) 02/08/2018   FTT (failure to thrive) in adult 01/28/2018   Anemia    S/P CABG x 3 01/18/2018   Coronary artery disease 12/29/2017   Chronic inactive rheumatic heart disease 11/24/2017   DNR (do not resuscitate) 06/14/2017   Peripheral neuropathy 06/14/2017   Tinea cruris 04/02/2017   Health maintenance examination 12/12/2016   Obesity (BMI 30-39.9) 12/12/2016   Advanced care planning/counseling discussion 10/30/2014   Benign prostatic hyperplasia 10/30/2014   CKD stage 3 due to type 2 diabetes mellitus (HCC) 07/08/2013   Tinea corporis 01/16/2013   Medicare annual wellness visit, subsequent 10/11/2011   Type 2 diabetes mellitus with other specified complication (HCC)    Hyperlipidemia associated with type 2 diabetes mellitus (HCC)    History of kidney stones    GERD (gastroesophageal reflux disease)     Orientation RESPIRATION BLADDER Height & Weight     Self, Time, Situation, Place  Normal Continent Weight: 98.8 kg Height:  5\' 10"  (177.8 cm)  BEHAVIORAL SYMPTOMS/MOOD NEUROLOGICAL BOWEL NUTRITION STATUS      Continent Diet (regular)  AMBULATORY STATUS COMMUNICATION OF NEEDS Skin   Limited Assist Verbally Skin abrasions, PU Stage and Appropriate Care, Other (Comment) (drain)                       Personal Care Assistance Level of Assistance              Functional Limitations Info      Hearing Info: Impaired      SPECIAL  CARE FACTORS FREQUENCY  PT (By licensed PT), OT (By licensed OT)                    Contractures Contractures Info: Not present    Additional Factors Info  Code Status, Allergies Code Status Info: DNr Allergies Info: NKDA           Current Medications (01/09/2023):  This is the current hospital active medication list Current Facility-Administered Medications  Medication Dose Route Frequency Provider Last Rate Last Admin   0.9 %  sodium  chloride infusion   Intravenous Continuous Darlin Priestly, MD 50 mL/hr at 01/08/23 1836 New Bag at 01/08/23 1836   acetaminophen (TYLENOL) tablet 650 mg  650 mg Oral Q6H PRN Gertha Calkin, MD       Or   acetaminophen (TYLENOL) suppository 650 mg  650 mg Rectal Q6H PRN Gertha Calkin, MD       Chlorhexidine Gluconate Cloth 2 % PADS 6 each  6 each Topical Daily Darlin Priestly, MD   6 each at 01/09/23 1443   diphenhydrAMINE (BENADRYL) capsule 25 mg  25 mg Oral QHS PRN Gertha Calkin, MD       enoxaparin (LOVENOX) injection 40 mg  40 mg Subcutaneous Q24H Darlin Priestly, MD   40 mg at 01/08/23 2110   feeding supplement (ENSURE ENLIVE / ENSURE PLUS) liquid 237 mL  237 mL Oral BID BM Darlin Priestly, MD   237 mL at 01/09/23 1443   hydrALAZINE (APRESOLINE) injection 5 mg  5 mg Intravenous Q4H PRN Gertha Calkin, MD       mirtazapine (REMERON) tablet 7.5 mg  7.5 mg Oral QHS Irena Cords V, MD   7.5 mg at 01/08/23 2217   nitroGLYCERIN (NITROSTAT) SL tablet 0.4 mg  0.4 mg Sublingual Q5 min PRN Gertha Calkin, MD       oxyCODONE-acetaminophen (PERCOCET/ROXICET) 5-325 MG per tablet 1 tablet  1 tablet Oral Q6H PRN Gertha Calkin, MD       pantoprazole (PROTONIX) EC tablet 40 mg  40 mg Oral Daily Darlin Priestly, MD   40 mg at 01/09/23 0850   piperacillin-tazobactam (ZOSYN) IVPB 3.375 g  3.375 g Intravenous Q8H Cordella Register A, RPH 12.5 mL/hr at 01/09/23 1442 3.375 g at 01/09/23 1442   rosuvastatin (CRESTOR) tablet 10 mg  10 mg Oral Daily Irena Cords V, MD   10 mg at 01/09/23 0850   sodium chloride flush (NS) 0.9 % injection 3 mL  3 mL Intravenous Q12H Irena Cords V, MD   3 mL at 01/08/23 1026   tamsulosin (FLOMAX) capsule 0.4 mg  0.4 mg Oral Daily Gertha Calkin, MD   0.4 mg at 01/09/23 1610     Discharge Medications: Please see discharge summary for a list of discharge medications.  Relevant Imaging Results:  Relevant Lab Results:   Additional Information SSN: [id number]  Chapman Fitch, RN

## 2023-01-09 NOTE — Evaluation (Signed)
Physical Therapy Evaluation Patient Details Name: [name] Young. MRN: [id number] DOB: 11/28/1940 Today's Date: 01/09/2023  History of Present Illness  Dequane Strahan. is a 82 y.o. male recently diagnosed hepatic abscess who comes in with worsening right-sided swelling, right-sided pain. PMH includes: BPH, CKD III, CAD, HLD, HTN, AFIB, & T2DM.   Clinical Impression  Pt admitted with above diagnosis. Pt currently with functional limitations due to the deficits listed below (see PT Problem List). Pt received upright in recliner agreeable to PT eval. Reports PTA he was at [facility] for STR maybe 2 days before returning. He was ambulating short distances using RW and having a chair follow due to poor endurance.   To date, pt stands at supervision level using arm rests to stand to RW completing a slow and laborious 40' of gait. Generally supervision with safe  use but does have some imbalance with turns relying on minguard for safety. Pt with increased WOB returning into room indicating limited endurance as pt described. Pt relies on modA at LE's returning to bed in care of RN. Pt will benefit from f/u PT resources at discharge to improve LE weakness and endurance to reduce falls risk and optimize return to PLOF.     Recommendations for follow up therapy are one component of a multi-disciplinary discharge planning process, led by the attending physician.  Recommendations may be updated based on patient status, additional functional criteria and insurance authorization.  Follow Up Recommendations Can patient physically be transported by private vehicle: No     Assistance Recommended at Discharge Frequent or constant Supervision/Assistance  Patient can return home with the following  A lot of help with bathing/dressing/bathroom;Assistance with cooking/housework;Assist for transportation;Help with stairs or ramp for entrance;A little help with walking and/or transfers    Equipment  Recommendations None recommended by PT  Recommendations for Other Services       Functional Status Assessment Patient has had a recent decline in their functional status and demonstrates the ability to make significant improvements in function in a reasonable and predictable amount of time.     Precautions / Restrictions Precautions Precautions: Fall Precaution Comments: RUQ abdominal/perihepatic JP drain Restrictions Weight Bearing Restrictions: No      Mobility  Bed Mobility Overal bed mobility: Needs Assistance Bed Mobility: Sit to Supine       Sit to supine: Mod assist   General bed mobility comments: at LE's Patient Response: Cooperative  Transfers Overall transfer level: Needs assistance Equipment used: Rolling walker (2 wheels) Transfers: Sit to/from Stand Sit to Stand: Supervision           General transfer comment: Use of arm rest in recliner to stand    Ambulation/Gait Ambulation/Gait assistance: Supervision, Min guard Gait Distance (Feet): 40 Feet Assistive device: Rolling walker (2 wheels) Gait Pattern/deviations: Step-to pattern, Trunk flexed       General Gait Details: Generally aupervision for gait but brief periods of minguard with turns  Information systems manager Rankin (Stroke Patients Only)       Balance Overall balance assessment: Needs assistance Sitting-balance support: No upper extremity supported, Feet supported Sitting balance-Leahy Scale: Good     Standing balance support: Bilateral upper extremity supported, During functional activity Standing balance-Leahy Scale: Fair Standing balance comment: Use of RW  Pertinent Vitals/Pain Pain Assessment Pain Assessment: Faces Faces Pain Scale: Hurts even more Pain Location: buttocks Pain Descriptors / Indicators: Sore Pain Intervention(s): Limited activity within patient's tolerance, Monitored during session,  Repositioned    Home Living Family/patient expects to be discharged to:: Skilled nursing facility Living Arrangements: Spouse/significant other Available Help at Discharge: Family;Available 24 hours/day Type of Home: House Home Access: Stairs to enter Entrance Stairs-Rails: Right Entrance Stairs-Number of Steps: 3   Home Layout: One level Home Equipment: Agricultural consultant (2 wheels);Cane - single point;Hand held shower head;Tub bench;Other (comment)      Prior Function Prior Level of Function : Needs assist       Physical Assist : Mobility (physical) Mobility (physical): Gait;Transfers   Mobility Comments: using RW at STR       Hand Dominance        Extremity/Trunk Assessment   Upper Extremity Assessment Upper Extremity Assessment: Defer to OT evaluation    Lower Extremity Assessment Lower Extremity Assessment: Generalized weakness       Communication   Communication: No difficulties  Cognition Arousal/Alertness: Awake/alert Behavior During Therapy: WFL for tasks assessed/performed Overall Cognitive Status: Within Functional Limits for tasks assessed                                          General Comments      Exercises Other Exercises Other Exercises: Role of PT in acute setting   Assessment/Plan    PT Assessment Patient needs continued PT services  PT Problem List Decreased strength;Decreased range of motion;Decreased activity tolerance;Decreased mobility;Decreased balance;Decreased knowledge of use of DME;Decreased knowledge of precautions;Decreased safety awareness;Pain       PT Treatment Interventions DME instruction;Gait training;Stair training;Functional mobility training;Therapeutic activities;Patient/family education;Therapeutic exercise;Balance training;Neuromuscular re-education    PT Goals (Current goals can be found in the Care Plan section)  Acute Rehab PT Goals Patient Stated Goal: improve his pain and go home PT Goal  Formulation: With patient Time For Goal Achievement: 01/23/23 Potential to Achieve Goals: Fair    Frequency Min 4X/week     Co-evaluation               AM-PAC PT "6 Clicks" Mobility  Outcome Measure Help needed turning from your back to your side while in a flat bed without using bedrails?: A Little Help needed moving from lying on your back to sitting on the side of a flat bed without using bedrails?: A Lot Help needed moving to and from a bed to a chair (including a wheelchair)?: A Little Help needed standing up from a chair using your arms (e.g., wheelchair or bedside chair)?: A Little Help needed to walk in hospital room?: A Little Help needed climbing 3-5 steps with a railing? : A Lot 6 Click Score: 16    End of Session Equipment Utilized During Treatment: Gait belt Activity Tolerance: Patient tolerated treatment well Patient left: in bed;with call bell/phone within reach;with bed alarm set;with nursing/sitter in room Nurse Communication: Mobility status PT Visit Diagnosis: Muscle weakness (generalized) (M62.81);Difficulty in walking, not elsewhere classified (R26.2)    Time: 9147-8295 PT Time Calculation (min) (ACUTE ONLY): 15 min   Charges:   PT Evaluation $PT Eval Low Complexity: 1 Low         Saidi Santacroce M. Fairly IV, PT, DPT Physical Therapist- [place]  [facility]  01/09/2023, 3:04 PM

## 2023-01-09 NOTE — Progress Notes (Signed)
PROGRESS NOTE    [name]  ZOX:[id number] DOB: Mar 16, 1941 DOA: 01/07/2023 PCP: [name], MD  216A/[id number]-AA  LOS: 2 days   Brief hospital course:   Assessment & Plan: Hermon, Zea. is a 82 year old male with history of CAD s/p CABG, Afib, Streptococcus and E coli bacteremia in Feb 2023 due to ascending cholangitis from choledocholithiasis s/p ERCP 10/23/21, mitral valve endocarditis treated with 6 weeks of IV cefazolin, liver abscess s/p drain placement on 12/29/22 currently on ceftriaxone, recurrent right pleural effusion who presented from SNF rehab with complaints of swelling and pain and rash over right side of his abdomen and right flank.  Liver abscesses --during last hospitalization from 12/28/22 to 01/05/23, pt was found to have an enlarging 15 x 18 to 9.8 cm complex fluid collection along the ventral margin of the liver and had a drain placed on 12/29/2022 with 500 cc of purulent fluid removed.  Liver abscess culture had E. coli.  Liver abscess is secondary to the choledocholithiasis and ascending cholangitis from back in 2023.  Pt was recently discharged on 01/05/23 with drain to liver abscess and 4 weeks of ceftriaxone. --current CT a/p showed markedly reduced size of known abscess, and a new 41 cc fluid collection suspicious for early perihepatic abscess formation. --IR repositioned existing drain and performed CT-guided aspiration of the new abscess (too small for a drain).  Abscess cx sent Plan: --cont abx as zosyn --ID consult today  Recurrent right pleural effusion --presumed due to liver abscess --s/p thoracentesis with 900 ml remoived  Chronic diastolic CHF (congestive heart failure) (HCC) --home Entresto and lasix due to soft BP   Bacteremia due to Escherichia coli, ruled out   Paroxysmal atrial fibrillation (HCC) --resume home Eliquis   Anemia, chronic   Coronary artery disease Stable. pt has no chest pain.  --cont statin   CKD stage 3,  ruled out --GFR >60   GERD (gastroesophageal reflux disease) --cont home PPI   Type 2 diabetes mellitus  --d/c'ed BG checks and SSI  Decubitus ulcer of right buttock, stage 2  --ordered Diflucan x2 on admission    DVT prophylaxis: Lovenox SQ Code Status: Full code  Family Communication: wife and daughter updated at bedside today Level of care: Med-Surg Dispo:   The patient is from: SNF rehab Anticipated d/c is to: SNF rehab Anticipated d/c date is: 1-2 days   Subjective and Interval History:  Pain controlled.  Right flank more swollen.   Objective: Vitals:   01/08/23 1830 01/08/23 1926 01/09/23 0429 01/09/23 0857  BP: 127/77 121/64 134/69 (!) 132/55  Pulse: 81 84 77 80  Resp: 19 20 20 18   Temp:  98.3 F (36.8 C) 97.9 F (36.6 C) 97.7 F (36.5 C)  TempSrc:  Oral Oral   SpO2: 98% 98% 98% 98%  Weight:  98.8 kg    Height:  5\' 10"  (1.778 m)      Intake/Output Summary (Last 24 hours) at 01/09/2023 1628 Last data filed at 01/09/2023 1426 Gross per 24 hour  Intake 810.97 ml  Output 1510 ml  Net -699.03 ml   Filed Weights   01/07/23 1539 01/08/23 1926  Weight: 97.5 kg 98.8 kg    Examination:   Constitutional: NAD, AAOx3 HEENT: conjunctivae and lids normal, EOMI, hard of hearing CV: No cyanosis.   RESP: normal respiratory effort, on RA Abdomen: liver drain with serosanguinous output Neuro: II - XII grossly intact.     Data Reviewed: I have personally reviewed labs  and imaging studies  Time spent: 50 minutes  Darlin Priestly, MD [facility] If 7PM-7AM, please contact night-coverage 01/09/2023, 4:28 PM

## 2023-01-10 DIAGNOSIS — K750 Abscess of liver: Secondary | ICD-10-CM | POA: Diagnosis not present

## 2023-01-10 DIAGNOSIS — I5032 Chronic diastolic (congestive) heart failure: Secondary | ICD-10-CM | POA: Diagnosis not present

## 2023-01-10 DIAGNOSIS — L89312 Pressure ulcer of right buttock, stage 2: Secondary | ICD-10-CM | POA: Diagnosis not present

## 2023-01-10 LAB — BASIC METABOLIC PANEL
Anion gap: 5 (ref 5–15)
BUN: 12 mg/dL (ref 8–23)
CO2: 26 mmol/L (ref 22–32)
Calcium: 8 mg/dL — ABNORMAL LOW (ref 8.9–10.3)
Chloride: 107 mmol/L (ref 98–111)
Creatinine, Ser: 0.82 mg/dL (ref 0.61–1.24)
GFR, Estimated: >60 mL/min (ref >60)
Glucose, Bld: 89 mg/dL (ref 70–99)
Potassium: 3.6 mmol/L (ref 3.5–5.1)
Sodium: 138 mmol/L (ref 135–145)

## 2023-01-10 LAB — MAGNESIUM: Magnesium: 1.7 mg/dL (ref 1.7–2.4)

## 2023-01-10 LAB — CBC
HCT: 28.5 % — ABNORMAL LOW (ref 39.0–52.0)
Hemoglobin: 8.9 g/dL — ABNORMAL LOW (ref 13.0–17.0)
MCH: 27.4 pg (ref 26.0–34.0)
MCHC: 31.2 g/dL (ref 30.0–36.0)
MCV: 87.7 fL (ref 80.0–100.0)
Platelets: 349 10*3/uL (ref 150–400)
RBC: 3.25 MIL/uL — ABNORMAL LOW (ref 4.22–5.81)
RDW: 15.9 % — ABNORMAL HIGH (ref 11.5–15.5)
WBC: 4.9 10*3/uL (ref 4.0–10.5)
nRBC: 0 % (ref 0.0–0.2)

## 2023-01-10 LAB — AEROBIC/ANAEROBIC CULTURE W GRAM STAIN (SURGICAL/DEEP WOUND): Gram Stain: NONE SEEN

## 2023-01-10 LAB — CULTURE, BLOOD (ROUTINE X 2): Special Requests: ADEQUATE

## 2023-01-10 NOTE — Progress Notes (Addendum)
PROGRESS NOTE    [name] Young.  WUJ:[id number] DOB: 05-20-1941 DOA: 01/07/2023 PCP: [name], MD  216A/[id number]-AA  LOS: 3 days   Brief hospital course:  [name], Young. is a 82 year old male with history of CAD s/p CABG, Afib, Streptococcus and E coli bacteremia in Feb 2023 due to ascending cholangitis from choledocholithiasis s/p ERCP 10/23/21, mitral valve endocarditis treated with 6 weeks of IV cefazolin, liver abscess s/p drain placement on 12/29/22 currently on ceftriaxone, recurrent right pleural effusion who presented from SNF rehab with complaints of swelling and pain and rash over right side of his abdomen and right flank.    Assessment & Plan:  Liver abscesses --during last hospitalization from 12/28/22 to 01/05/23, pt was found to have an enlarging 15 x 18 to 9.8 cm complex fluid collection along the ventral margin of the liver and had a drain placed on 12/29/2022 with 500 cc of purulent fluid removed.  Liver abscess culture had E. coli.  Liver abscess is secondary to the choledocholithiasis and ascending cholangitis from back in 2023.  Pt was recently discharged on 01/05/23 with drain to liver abscess and 4 weeks of ceftriaxone. --current CT a/p showed markedly reduced size of known abscess, and a new 41 cc fluid collection suspicious for early perihepatic abscess formation. --IR repositioned existing drain and performed CT-guided aspiration of the new abscess (too small for a drain).  Abscess cx sent Plan: --Continue Zosyn --Follow cultures --ID consulted, appreciate recommendations  Recurrent right pleural effusion --presumed due to liver abscess --s/p thoracentesis with 900 ml remoived  Chronic diastolic CHF (congestive heart failure) (HCC) --home Entresto and lasix due to soft BP   Bacteremia due to Escherichia coli, ruled out   Paroxysmal atrial fibrillation (HCC) --resume home Eliquis   Anemia, chronic   Coronary artery disease Stable. pt has no  chest pain.  --cont statin   CKD stage 3, ruled out --GFR >60   GERD (gastroesophageal reflux disease) --cont home PPI   Type 2 diabetes mellitus  --d/c'ed BG checks and SSI  Decubitus ulcer of right buttock, stage 2  I agree with the wound description as outlined --Continue diligent wound care --Offload area  --Frequent repositioning --Monitor for signs of infection --Appreciate Wound Care RN's recommendations (01/04/23) Pressure Injury 01/08/23 Buttocks Right;Mid Stage 2 -  Partial thickness loss of dermis presenting as a shallow open injury with a red, pink wound bed without slough. pink, very shallow, macerated skin (Active)  01/08/23 1920  Location: Buttocks  Location Orientation: Right;Mid  Staging: Stage 2 -  Partial thickness loss of dermis presenting as a shallow open injury with a red, pink wound bed without slough.  Wound Description (Comments): pink, very shallow, macerated skin  Present on Admission: Yes     --Diflucan x2 on admission    DVT prophylaxis: Lovenox SQ Code Status: Full code  Family Communication: wife and daughter updated at bedside today Level of care: Med-Surg Dispo:   The patient is from: SNF rehab Anticipated d/c is to: SNF rehab Anticipated d/c date is: 1-2 days   Subjective and Interval History:  Pt seen with wife at bedside.  He reports ongoing RUQ pain and discomfort, mostly from the drain. He also reports very sore, raw bottom.  No fever or chills, no nausea or vomiting.  Hopes to get back to rehab and get stronger soon.  Concerned about his progressive weakness from recent admission/s.   Objective: Vitals:   01/10/23 0500 01/10/23 0554 01/10/23 0803 01/10/23  1637  BP:  113/67 129/71 (!) 102/39  Pulse:  68 75 (!) 55  Resp:  18 16 16   Temp:  98.4 F (36.9 C) 98.7 F (37.1 C) 98.4 F (36.9 C)  TempSrc:  Oral    SpO2:  97% 96% 98%  Weight: 99.5 kg     Height:        Intake/Output Summary (Last 24 hours) at 01/10/2023  1651 Last data filed at 01/10/2023 1507 Gross per 24 hour  Intake 840 ml  Output 410 ml  Net 430 ml   Filed Weights   01/07/23 1539 01/08/23 1926 01/10/23 0500  Weight: 97.5 kg 98.8 kg 99.5 kg    Examination:   General exam: awake, alert, no acute distress HEENT: moist mucus membranes, hearing grossly normal  Respiratory system: CTAB, no wheezes, rales or rhonchi, normal respiratory effort. Cardiovascular system: normal S1/S2, RRR, no JVD, murmurs, rubs, gallops,  no pedal edema.   Gastrointestinal system: JP drain in RUQ with serosanguinous fluid, abdomen is soft, NT Central nervous system: A&O x . no gross focal neurologic deficits, normal speech Extremities: moves all, no edema, normal tone Skin: dry, intact, normal temperature Psychiatry: normal mood, congruent affect, judgement and insight appear normal    Data Reviewed: I have personally reviewed labs and imaging studies  Notable labs --- Ca 8.0, Hbg 8.9 from 9.3  Micro --  Blood cultures 4/30 - negative to date, pending  Abscess cultures 4/29 -- negative to date, pending   Time spent: 42 minutes  [name], DO [facility] If 7PM-7AM, please contact night-coverage 01/10/2023, 4:51 PM

## 2023-01-10 NOTE — Progress Notes (Signed)
Pharmacy Antibiotic Note  [name] Young. is a 82 y.o. male admitted on 01/07/2023 with abdominal pain.  Pharmacy has been consulted for Zosyn dosing for recurrent hepatic abscess.  Plan:.  Continue Zosyn 3.375 grams IV every 8 hours (4 hour infusion) Follow renal function and cultures for needed adjustments   Height: 5\' 10"  (177.8 cm) Weight: 99.5 kg (219 lb 4.8 oz) IBW/kg (Calculated) : 73  Temp (24hrs), Avg:98.3 F (36.8 C), Min:97.7 F (36.5 C), Max:98.7 F (37.1 C)  Recent Labs  Lab 01/04/23 1600 01/05/23 0430 01/07/23 1541 01/08/23 0440 01/09/23 0601 01/10/23 0604  WBC  --  10.2 7.9 7.9 5.1 4.9  CREATININE 0.85  --  0.95 0.88 0.91 0.82  LATICACIDVEN  --   --  1.3  --   --   --      Estimated Creatinine Clearance: 85 mL/min (by C-G formula based on SCr of 0.82 mg/dL).    No Known Allergies  Antimicrobials this admission: vancomycin 4/28 >> 4/29 zosyn 4/28 >>   Dose adjustments this admission: N/a  Microbiology results: 4/29: Body fluid from abd: pending 4/30 bcx: ngtd  Thank you for allowing pharmacy to be a part of this patient's care.  Barrie Folk, PharmD Clinical Pharmacist  01/10/2023 8:40 AM

## 2023-01-10 NOTE — TOC Progression Note (Signed)
Transition of Care (TOC) - Progression Note    Patient Details  Name: [name] Young. MRN: [id number] Date of Birth: June 28, 1941  Transition of Care [facility]) CM/SW Contact  Chapman Fitch, RN Phone Number: 01/10/2023, 9:04 AM  Clinical Narrative:        Insurance auth approved [id number] 5/1-5/3.  MD notified     Expected Discharge Plan and Services                                               Social Determinants of Health (SDOH) Interventions SDOH Screenings   Food Insecurity: Patient Declined (12/26/2022)  Housing: Low Risk  (12/26/2022)  Transportation Needs: No Transportation Needs (12/26/2022)  Depression (PHQ2-9): Medium Risk (12/26/2022)  Financial Resource Strain: Low Risk  (12/26/2022)  Physical Activity: Unknown (12/26/2022)  Social Connections: Unknown (12/26/2022)  Stress: No Stress Concern Present (12/26/2022)  Tobacco Use: Medium Risk (01/08/2023)    Readmission Risk Interventions     No data to display

## 2023-01-10 NOTE — Progress Notes (Signed)
Occupational Therapy Treatment Patient Details Name: [name] Young. MRN: [id number] DOB: [birth year] Today's Date: 01/10/2023   History of present illness [name] Young. is a 82 y.o. male recently diagnosed hepatic abscess who comes in with worsening right-sided swelling, right-sided pain. PMH includes: BPH, CKD III, CAD, HLD, HTN, AFIB, & T2DM.   OT comments  [name] Young was seen for OT session focused on ADLs. Upon arrival pt reclined in bed, agreeable to session. Pt demonstrates good insight into deficits. Pt requires MOD A don shorts/underwear, assist for threading and pulling up over rear in standing. SBA + RW funcitonal mobility ~50 ft and standing grooming activities, tolerates ~8 min prior to requiring prolonged seated rest rest break. SpO2 98% on RA following activity. Pt progressing well towards goals. Discharge recommendation remains appropriate, will continue to follow POC.    Recommendations for follow up therapy are one component of a multi-disciplinary discharge planning process, led by the attending physician.  Recommendations may be updated based on patient status, additional functional criteria and insurance authorization.    Assistance Recommended at Discharge Frequent or constant Supervision/Assistance  Patient can return home with the following  A little help with walking and/or transfers;A little help with bathing/dressing/bathroom;Assistance with cooking/housework;Assist for transportation;Help with stairs or ramp for entrance;Direct supervision/assist for medications management   Equipment Recommendations  BSC/3in1    Recommendations for Other Services      Precautions / Restrictions Precautions Precautions: Fall Precaution Comments: RUQ abdominal/perihepatic JP drain Restrictions Weight Bearing Restrictions: No       Mobility Bed Mobility Overal bed mobility: Needs Assistance Bed Mobility: Supine to Sit, Sit to Supine     Supine to sit:  Supervision, HOB elevated Sit to supine: Mod assist        Transfers Overall transfer level: Needs assistance Equipment used: Rolling walker (2 wheels) Transfers: Sit to/from Stand Sit to Stand: Min guard           General transfer comment: use of momentum and rocking to achieve lift off     Balance Overall balance assessment: Needs assistance Sitting-balance support: No upper extremity supported, Feet supported Sitting balance-Leahy Scale: Good     Standing balance support: No upper extremity supported, During functional activity Standing balance-Leahy Scale: Fair                             ADL either performed or assessed with clinical judgement   ADL Overall ADL's : Needs assistance/impaired                                       General ADL Comments: MOD A don shorts/underwear, assist for threading and pulling up over rear in standing. SBA + RW funcitonal mobility ~50 ft and standing grooming activities, tolerates ~8 min prior to requiring rest break.      Cognition Arousal/Alertness: Awake/alert Behavior During Therapy: WFL for tasks assessed/performed Overall Cognitive Status: Within Functional Limits for tasks assessed                                                     Pertinent Vitals/ Pain       Pain Assessment Pain Assessment: 0-10 Pain Score: 6  Pain Location: buttocks, drain site. Pain Descriptors / Indicators: Sore, Guarding, Grimacing Pain Intervention(s): Limited activity within patient's tolerance, Repositioned, Patient requesting pain meds-RN notified   Frequency  Min 3X/week        Progress Toward Goals  OT Goals(current goals can now be found in the care plan section)  Progress towards OT goals: Progressing toward goals  Acute Rehab OT Goals Patient Stated Goal: to improve endurance OT Goal Formulation: With patient/family Time For Goal Achievement: 01/13/23 Potential to Achieve  Goals: Good ADL Goals Pt Will Perform Grooming: sitting;with set-up;with supervision;standing Pt Will Perform Lower Body Dressing: sit to/from stand;with supervision;with set-up Pt Will Transfer to Toilet: ambulating;with modified independence;bedside commode Pt Will Perform Toileting - Clothing Manipulation and hygiene: sit to/from stand;with modified independence  Plan Discharge plan remains appropriate;Frequency remains appropriate    Co-evaluation                 AM-PAC OT "6 Clicks" Daily Activity     Outcome Measure   Help from another person eating meals?: None Help from another person taking care of personal grooming?: A Little Help from another person toileting, which includes using toliet, bedpan, or urinal?: A Little Help from another person bathing (including washing, rinsing, drying)?: A Little Help from another person to put on and taking off regular upper body clothing?: A Little Help from another person to put on and taking off regular lower body clothing?: A Lot 6 Click Score: 18    End of Session Equipment Utilized During Treatment: Rolling walker (2 wheels)  OT Visit Diagnosis: Unsteadiness on feet (R26.81)   Activity Tolerance Patient tolerated treatment well   Patient Left in bed;with call bell/phone within reach;with family/visitor present   Nurse Communication          Time: 1610-9604 OT Time Calculation (min): 35 min  Charges: OT General Charges $OT Visit: 1 Visit OT Treatments $Self Care/Home Management : 23-37 mins  Kathie Dike, M.S. OTR/L  01/10/23, 10:51 AM  ascom [phone]

## 2023-01-10 NOTE — Progress Notes (Signed)
Physical Therapy Treatment Patient Details Name: [name] Young. MRN: [id number] DOB: June 04, 1941 Today's Date: 01/10/2023   History of Present Illness [name] Young. is a 82 y.o. male recently diagnosed hepatic abscess who comes in with worsening right-sided swelling, right-sided pain. PMH includes: BPH, CKD III, CAD, HLD, HTN, AFIB, & T2DM.    PT Comments    Pt received upright in bed agreeable to PT services. Pt moving generally at supervision to minguard level but does have difficulty with bed mobility needing bed features and mod VC's. Pt requires multiple bouts of momentum to stand to RW but is able to complete ~140' of gait. Pt returns to EOB with increased WOB post gait needing modA at LE's and bed features to return to supine. Pt remains with generalized weakness with transfers thus d/c recs remain appropriate. All needs in reach.    Recommendations for follow up therapy are one component of a multi-disciplinary discharge planning process, led by the attending physician.  Recommendations may be updated based on patient status, additional functional criteria and insurance authorization.  Follow Up Recommendations  Can patient physically be transported by private vehicle: No    Assistance Recommended at Discharge Frequent or constant Supervision/Assistance  Patient can return home with the following A lot of help with bathing/dressing/bathroom;Assistance with cooking/housework;Assist for transportation;Help with stairs or ramp for entrance;A little help with walking and/or transfers   Equipment Recommendations  None recommended by PT    Recommendations for Other Services       Precautions / Restrictions Precautions Precautions: Fall Precaution Comments: RUQ abdominal/perihepatic JP drain Restrictions Weight Bearing Restrictions: No     Mobility  Bed Mobility Overal bed mobility: Needs Assistance Bed Mobility: Supine to Sit, Sit to Supine     Supine to sit:  Supervision, HOB elevated Sit to supine: Mod assist   General bed mobility comments: modA at LE's. Generally increased time and effort with mod VC's for UE sequencing to improve ease of transfer. Patient Response: Cooperative  Transfers Overall transfer level: Needs assistance Equipment used: Rolling walker (2 wheels) Transfers: Sit to/from Stand Sit to Stand: Min guard           General transfer comment: use of momentum and rocking to achieve lift off    Ambulation/Gait Ambulation/Gait assistance: Supervision Gait Distance (Feet): 140 Feet Assistive device: Rolling walker (2 wheels) Gait Pattern/deviations: Step-to pattern, Trunk flexed       General Gait Details: Very slowed gait speed, laborious use of RW. Generally strong in LE's to complete this distance but relies on RW for significant support.   Stairs             Wheelchair Mobility    Modified Rankin (Stroke Patients Only)       Balance Overall balance assessment: Needs assistance Sitting-balance support: No upper extremity supported, Feet supported Sitting balance-Leahy Scale: Good     Standing balance support: No upper extremity supported, During functional activity Standing balance-Leahy Scale: Fair Standing balance comment: can stand without UE's on RW                            Cognition Arousal/Alertness: Awake/alert Behavior During Therapy: WFL for tasks assessed/performed Overall Cognitive Status: Within Functional Limits for tasks assessed  Exercises      General Comments        Pertinent Vitals/Pain Pain Assessment Pain Assessment: Faces Faces Pain Scale: Hurts little more Pain Location: buttocks, drain site. Pain Descriptors / Indicators: Sore, Guarding, Grimacing Pain Intervention(s): Limited activity within patient's tolerance, Monitored during session, Repositioned    Home Living                           Prior Function            PT Goals (current goals can now be found in the care plan section) Acute Rehab PT Goals Patient Stated Goal: improve his pain and go home PT Goal Formulation: With patient Time For Goal Achievement: 01/23/23 Potential to Achieve Goals: Fair Progress towards PT goals: Progressing toward goals    Frequency    Min 4X/week      PT Plan Current plan remains appropriate    Co-evaluation              AM-PAC PT "6 Clicks" Mobility   Outcome Measure  Help needed turning from your back to your side while in a flat bed without using bedrails?: A Lot Help needed moving from lying on your back to sitting on the side of a flat bed without using bedrails?: A Lot Help needed moving to and from a bed to a chair (including a wheelchair)?: A Little Help needed standing up from a chair using your arms (e.g., wheelchair or bedside chair)?: A Little Help needed to walk in hospital room?: A Little Help needed climbing 3-5 steps with a railing? : A Lot 6 Click Score: 15    End of Session Equipment Utilized During Treatment: Gait belt Activity Tolerance: Patient tolerated treatment well Patient left: in bed;with call bell/phone within reach;with bed alarm set;with nursing/sitter in room Nurse Communication: Mobility status PT Visit Diagnosis: Muscle weakness (generalized) (M62.81);Difficulty in walking, not elsewhere classified (R26.2)     Time: 0454-0981 PT Time Calculation (min) (ACUTE ONLY): 23 min  Charges:  $Therapeutic Exercise: 23-37 mins                    Hyder Deman M. Fairly IV, PT, DPT Physical Therapist- [place]  [facility]  01/10/2023, 3:39 PM

## 2023-01-10 NOTE — Plan of Care (Signed)

## 2023-01-10 NOTE — Care Management Important Message (Signed)
Important Message  Patient Details  Name: [name] Young. MRN: [id number] Date of Birth: [birth year]   Medicare Important Message Given:  Yes     [name] 01/10/2023, 11:09 AM

## 2023-01-11 ENCOUNTER — Inpatient Hospital Stay: Payer: Medicare PPO

## 2023-01-11 DIAGNOSIS — I5032 Chronic diastolic (congestive) heart failure: Secondary | ICD-10-CM | POA: Diagnosis not present

## 2023-01-11 DIAGNOSIS — I480 Paroxysmal atrial fibrillation: Secondary | ICD-10-CM | POA: Diagnosis not present

## 2023-01-11 DIAGNOSIS — Z79899 Other long term (current) drug therapy: Secondary | ICD-10-CM | POA: Diagnosis not present

## 2023-01-11 DIAGNOSIS — K750 Abscess of liver: Secondary | ICD-10-CM | POA: Diagnosis not present

## 2023-01-11 LAB — CBC
HCT: 29.1 % — ABNORMAL LOW (ref 39.0–52.0)
Hemoglobin: 8.8 g/dL — ABNORMAL LOW (ref 13.0–17.0)
MCH: 26.9 pg (ref 26.0–34.0)
MCHC: 30.2 g/dL (ref 30.0–36.0)
MCV: 89 fL (ref 80.0–100.0)
Platelets: 347 10*3/uL (ref 150–400)
RBC: 3.27 MIL/uL — ABNORMAL LOW (ref 4.22–5.81)
RDW: 16 % — ABNORMAL HIGH (ref 11.5–15.5)
WBC: 5.2 10*3/uL (ref 4.0–10.5)
nRBC: 0 % (ref 0.0–0.2)

## 2023-01-11 LAB — BASIC METABOLIC PANEL
Anion gap: 6 (ref 5–15)
BUN: 11 mg/dL (ref 8–23)
CO2: 26 mmol/L (ref 22–32)
Calcium: 8.1 mg/dL — ABNORMAL LOW (ref 8.9–10.3)
Chloride: 106 mmol/L (ref 98–111)
Creatinine, Ser: 0.9 mg/dL (ref 0.61–1.24)
GFR, Estimated: >60 mL/min (ref >60)
Glucose, Bld: 88 mg/dL (ref 70–99)
Potassium: 3.9 mmol/L (ref 3.5–5.1)
Sodium: 138 mmol/L (ref 135–145)

## 2023-01-11 LAB — AEROBIC/ANAEROBIC CULTURE W GRAM STAIN (SURGICAL/DEEP WOUND)

## 2023-01-11 LAB — GLUCOSE, CAPILLARY: Glucose-Capillary: 87 mg/dL (ref 70–99)

## 2023-01-11 MED ORDER — IOHEXOL 300 MG/ML  SOLN
100.0000 mL | Freq: Once | INTRAMUSCULAR | Status: AC | PRN
  Administered 2023-01-11: 100 mL via INTRAVENOUS

## 2023-01-11 MED ORDER — METRONIDAZOLE 500 MG PO TABS
500.0000 mg | ORAL_TABLET | Freq: Two times a day (BID) | ORAL | Status: DC
  Administered 2023-01-11 – 2023-01-12 (×2): 500 mg via ORAL
  Filled 2023-01-11 (×2): qty 1

## 2023-01-11 MED ORDER — METRONIDAZOLE 500 MG PO TABS
500.0000 mg | ORAL_TABLET | Freq: Three times a day (TID) | ORAL | Status: DC
  Filled 2023-01-11: qty 1

## 2023-01-11 MED ORDER — ZINC OXIDE 40 % EX OINT
TOPICAL_OINTMENT | Freq: Three times a day (TID) | CUTANEOUS | Status: DC
  Filled 2023-01-11: qty 113

## 2023-01-11 MED ORDER — SODIUM CHLORIDE 0.9 % IV SOLN
2.0000 g | INTRAVENOUS | Status: DC
  Administered 2023-01-11 – 2023-01-12 (×2): 2 g via INTRAVENOUS
  Filled 2023-01-11: qty 2
  Filled 2023-01-11: qty 20

## 2023-01-11 MED ORDER — IOHEXOL 9 MG/ML PO SOLN
500.0000 mL | ORAL | Status: AC
  Administered 2023-01-11 (×2): 500 mL via ORAL

## 2023-01-11 NOTE — Progress Notes (Signed)
PT Cancellation Note  Patient Details Name: [name] Young. MRN: [id number] DOB: August 09, 1941   Cancelled Treatment:    Reason Eval/Treat Not Completed: Patient at procedure or test/unavailable. Per Rn pt going to CT scan. PT to hold and re-attempt tomorrow. Per EMR pt pending d/c home and needs stair assessment prior to d/c.    Delphia Grates. Fairly IV, PT, DPT Physical Therapist- [place]  [facility]  01/11/2023, 3:39 PM

## 2023-01-11 NOTE — Progress Notes (Addendum)
[facility] Infectious Diseases Follow Up Note  Patient Identification: Patient Name: [name] Young. MRN: [id number] Admit Date: 01/07/2023  6:05 PM Age: 82 y.o.Today's Date: 01/11/2023  Reason for Visit: Liver abscess   Principal Problem:   Abdominal pain Active Problems:   Type 2 diabetes mellitus with other specified complication (HCC)   GERD (gastroesophageal reflux disease)   CKD stage 3 due to type 2 diabetes mellitus (HCC)   Advanced care planning/counseling discussion   Coronary artery disease   Anemia   Paroxysmal atrial fibrillation (HCC)   Bacteremia due to Escherichia coli   Recurrent right pleural effusion   Hepatic abscess   Chronic diastolic CHF (congestive heart failure) (HCC)   Decubitus ulcer of right buttock, stage 2 (HCC)   Antibiotics:  Ceftriaxone and metronidazole PTA Vancomycin/metronidazole 4/28 Fluconazole 4/28-4/29 Zosyn 4/28-c   Lines/Hardwares:   Interval Events: remains afebrile, IR aspirate cx no growth    Assessment 82 YO male with multiple co-morbidities as below including h/o Streptococcus and E. coli bacteremia February 2023 due to ascending cholangitis from choledocholithiasis status post ERCP on 11/01/2021 complicated by mitral valve endocarditis treated with cefazolin x 6 weeks with recent admission on 4/19-26 to [place] for liver abscess status post drainage catheter placement 4/19 with cultures growing E. coli discharged on ceftriaxone x 4 weeks presented again with pain/rash on right side of his abdomen and right flank plain with bloody drainage.  # Perihepatic abscess/new 41 cc fluid collection with enhancing margins suspicious for early perihepatic abscess formation/ Adjacent abnormal infiltrative edema tracks along the paracolic gutter and omentum, and there is overlying subcutaneous edema and cutaneous thickening along the right abdomen  # Recurrent right pleural effusion  with passive atelectasis - 2/2 above  4/29 s/p CT guided rt sided thoracentesis and subhepatic fluid  aspiration. Findings favor loculated ascites. Cx NGTD ( on abtx) Per wife, only one dose if IV ceftriaxone missed after discharged last time   # Rt buttock stage 2 ulcer - WOC following. S/p 2 doses of Fluconazole   Recommendations Will switch abtx to ceftriaxone and metronidazole as suspect same prior bugs playing role and unlikely to be failure of abtx. Low concerns for PsA Plan for 4 weeks of ceftriaxone and metronidaozle from 4/29 Fu aspirate cx to completion Monitor CBC and CMP Drain management per IR  Rest of the management as per the primary team. Thank you for the consult. Please page with pertinent questions or concerns.  ______________________________________________________________________ Subjective patient seen and examined at the bedside. Still has RUQ soreness when moves around or walks. Denies nausea, vomiting and diarrhe a   Past Medical History:  Diagnosis Date   Adenomatous colon polyp    Aortic atherosclerosis (HCC)    Arthritis    Ascending aorta dilatation (HCC)    a.) TTE 11/23/2017: asc Ao measured 37 mm; b.) TTE 10/23/2021: Ao root measured 41 mm, asc Ao measured 38 mm; c.) TEE 10/28/2021: asc Ao measured 38 mm; d.) TTE 01/11/2022: Ao root 40 mm, asc Ao 39 mm   Ascending cholangitis 10/2021   BPH (benign prostatic hyperplasia)    Cardiomyopathy (HCC)    CKD (chronic kidney disease), stage III (HCC)    Claustrophobia    Coronary artery disease    a.) LHC 12/21/2017: 75% mLAD, 90% D1, 80-99% OM1/2, CTO pRCA (L-R collaterals) --> CVTS consult. 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   In setting of  bacteremia from ascending colangitis   GERD (gastroesophageal reflux disease)    HFrEF (heart failure with reduced ejection fraction) (HCC)    a.) TTE 11/23/2017: EF 50-55%, mod MAC, triv TR, G1DD; b.) TTE  10/23/2021: EF 40-45%, mild LAE, Ao sclerosis, triv MR, G2DD; c.) TEE 10/28/2021: EF 40-45%, glob HK, mobile vegitation on MV; d.) TTE 01/11/2022: EF 50-55%, mild LVH, RVE, Ao sclerosis, mild MR/AR, G1DD   History of cholelithiasis    History of kidney stones    ca ox Vonita Moss @ [facility]) now [employer] [facility]   History of pneumonia    HLD (hyperlipidemia)    HTN (hypertension)    Jaundice    age 68   Long term current use of anticoagulant    a.) apixaban   Paroxysmal atrial fibrillation (HCC)    a.) CHA2DS2VASc = 6 (age x 2, HFrEF, HTN, vascular disease history, T2DM);  b.) rate/rhythm maintained on oral metoprolol succinate; chronically anticoagulated with apixaban   Pneumonia    Right-sided carotid artery disease (HCC)    a.) carotid doppler 04/05/2022: 1-39% RICA   S/P CABG x 3    a.) LIMA-LAD, SVG-diagonal, SVG-PL branch of RCA   S/P cataract extraction and insertion of intraocular lens    T2DM (type 2 diabetes mellitus) (HCC) 2010   Past Surgical History:  Procedure Laterality Date   CARPAL TUNNEL RELEASE Bilateral    CATARACT EXTRACTION, BILATERAL     COLONOSCOPY  11/2012   12 adenomatous polyps, diverticulosis, rec rpt 1 yr [name])   COLONOSCOPY  12/2013   3 polyps, diverticulosis, rec rpt 3 yrs [name])   COLONOSCOPY  06/2019   6 polyps (TA), diverticulosis, f/u left open ended [name])   CORONARY ARTERY BYPASS GRAFT N/A 01/18/2018   Procedure: CORONARY ARTERY BYPASS GRAFTING (CABG) x 3; Using Left Internal Mammary Artery, and Right Greater Saphenous Vein harvested Endoscopically, Coronary Artery Endarterectomy;  Surgeon: Kerin Perna, MD;  Location[facility] OR;  Service: Open Heart Surgery;  Laterality: N/A;   ERCP N/A 10/23/2021   Procedure: ENDOSCOPIC RETROGRADE CHOLANGIOPANCREATOGRAPHY (ERCP);  Surgeon: Meryl Dare, MD;  Location: [facility] ENDOSCOPY;  Service: Endoscopy;  Laterality: N/A;   IR CATHETER TUBE CHANGE  01/08/2023   IR THORACENTESIS ASP PLEURAL SPACE W/IMG GUIDE   12/29/2022   KNEE CARTILAGE SURGERY Left    LEFT HEART CATH AND CORONARY ANGIOGRAPHY N/A 12/21/2017   Procedure: LEFT HEART CATH AND CORONARY ANGIOGRAPHY;  Surgeon: Yvonne Kendall, MD;  Location: [facility] INVASIVE CV LAB;  Service: Cardiovascular;  Laterality: N/A;   LITHOTRIPSY     REMOVAL OF STONES  10/23/2021   Procedure: REMOVAL OF STONES;  Surgeon: Meryl Dare, MD;  Location: [facility] ENDOSCOPY;  Service: Endoscopy;;   SPHINCTEROTOMY  10/23/2021   Procedure: Dennison Mascot;  Surgeon: Meryl Dare, MD;  Location: [facility] ENDOSCOPY;  Service: Endoscopy;;   TEE WITHOUT CARDIOVERSION N/A 01/18/2018   Procedure: TRANSESOPHAGEAL ECHOCARDIOGRAM (TEE);  Surgeon: Donata Clay, Theron Arista, MD;  Location: [facility] OR;  Service: Open Heart Surgery;  Laterality: N/A;   TEE WITHOUT CARDIOVERSION N/A 10/28/2021   Procedure: TRANSESOPHAGEAL ECHOCARDIOGRAM (TEE);  Surgeon: Chrystie Nose, MD;  Location: [facility] ENDOSCOPY;  Service: Cardiovascular;  Laterality: N/A;   UMBILICAL HERNIA REPAIR     with mesh     Vitals BP 130/65 (BP Location: Right Arm)   Pulse 77   Temp 98.1 F (36.7 C)   Resp 18   Ht 5\' 10"  (1.778 m)   Wt 99.8 kg   SpO2 94%  BMI 31.57 kg/m     Physical Exam Constitutional:  adult male lying in the bed eating jellow     Comments: HEENT wnl   Cardiovascular:     Rate and Rhythm: Normal rate and regular rhythm.     Heart sounds: s1s2  Pulmonary:     Effort: Pulmonary effort is normal on P[place]    Comments: decreased air entry in the bases   Abdominal:     Palpations: Abdomen is soft.     Tenderness: non distended, mild tenderness in the rt UQ, drain with minimal serous fluid. no RT and rigidity  Musculoskeletal:        General: No swelling or tenderness in peripheral joints or septic joint   Skin:    Comments: No rashes, left arm PICC OK   Neurological:     General: awake, alert and oriented, following commands   Psychiatric:        Mood and Affect: Mood normal.   Pertinent  Microbiology Results for orders placed or performed during the hospital encounter of 01/07/23  Aerobic/Anaerobic Culture w Gram Stain (surgical/deep wound)     Status: None (Preliminary result)   Collection Time: 01/08/23  4:39 PM   Specimen: Abdomen; Body Fluid  Result Value Ref Range Status   Specimen Description   Final    ABDOMEN Performed at [facility], [address] [place], [place] [address]    Special Requests   Final    NONE Performed at [facility], [address] [place], [place] [address]    Gram Stain NO WBC SEEN NO ORGANISMS SEEN   Final   Culture   Final    NO GROWTH 3 DAYS NO ANAEROBES ISOLATED; CULTURE IN PROGRESS FOR 5 DAYS Performed at [facility], [address], [place], [place] [address]    Report Status PENDING  Incomplete  Culture, blood (Routine X 2) w Reflex to ID Panel     Status: None (Preliminary result)   Collection Time: 01/09/23  4:03 PM   Specimen: BLOOD  Result Value Ref Range Status   Specimen Description BLOOD BLOOD RIGHT HAND right wrist  Final   Special Requests   Final    BOTTLES DRAWN AEROBIC ONLY Blood Culture adequate volume   Culture   Final    NO GROWTH 2 DAYS Performed at [facility], [address], [place], [place] [address]    Report Status PENDING  Incomplete  Culture, blood (Routine X 2) w Reflex to ID Panel     Status: None (Preliminary result)   Collection Time: 01/09/23  4:06 PM   Specimen: BLOOD  Result Value Ref Range Status   Specimen Description BLOOD BLOOD RIGHT ARM RAC  Final   Special Requests   Final    BOTTLES DRAWN AEROBIC AND ANAEROBIC Blood Culture adequate volume   Culture   Final    NO GROWTH 2 DAYS Performed at [facility], [address] [place], [place] [address]    Report Status PENDING  Incomplete  '   Pertinent Lab.    Latest Ref Rng & Units 01/11/2023    5:06 AM 01/10/2023    6:04 AM 01/09/2023    6:01 AM  CBC  WBC 4.0 - 10.5 K/uL 5.2   4.9  5.1   Hemoglobin 13.0 - 17.0 g/dL 8.8  8.9  9.3   Hematocrit 39.0 - 52.0 % 29.1  28.5  29.3   Platelets 150 - 400 K/uL  347  349  379       Latest Ref Rng & Units 01/11/2023    5:06 AM 01/10/2023    6:04 AM 01/09/2023    6:01 AM  CMP  Glucose 70 - 99 mg/dL 88  89  90   BUN 8 - 23 mg/dL 11  12  13    Creatinine 0.61 - 1.24 mg/dL 1.61  0.96  0.45   Sodium 135 - 145 mmol/L 138  138  138   Potassium 3.5 - 5.1 mmol/L 3.9  3.6  3.7   Chloride 98 - 111 mmol/L 106  107  106   CO2 22 - 32 mmol/L 26  26  26    Calcium 8.9 - 10.3 mg/dL 8.1  8.0  8.0      Pertinent Imaging today Plain films and CT images have been personally visualized and interpreted; radiology reports have been reviewed. Decision making incorporated into the Impression   CT ASPIRATION N/S  Result Date: 01/10/2023 INDICATION: Pleural effusion, subhepatic fluid collection EXAM: 1. CT-guided right thoracentesis 2. CT-guided aspiration of intra-abdominal subhepatic fluid collection MEDICATIONS: None. ANESTHESIA/SEDATION: Moderate (conscious) sedation was employed during this procedure. A total of Versed 2 mg and Fentanyl 50 mcg was administered intravenously. Moderate Sedation Time: 22 minutes. The patient's level of consciousness and vital signs were monitored continuously by radiology nursing throughout the procedure under my direct supervision. FLUOROSCOPY TIME:  N/a COMPLICATIONS: None immediate. PROCEDURE: Informed written consent was obtained from the patient after a thorough discussion of the procedural risks, benefits and alternatives. All questions were addressed. Maximal Sterile Barrier Technique was utilized including caps, mask, sterile gowns, sterile gloves, sterile drape, hand hygiene and skin antiseptic. A timeout was performed prior to the initiation of the procedure. The patient was placed supine on the exam table. Limited CT of the chest was performed for planning purposes. This demonstrated a moderate right pleural  effusion. Skin entry site was marked, the overlying skin was prepped and draped in the standard sterile fashion. Local analgesia was obtained with 1% lidocaine. Using intermittent CT fluoroscopy, a 19 gauge Yueh catheter was advanced into the right pleural fluid. Location was confirmed with CT. Approximately 100 mL of clear, straw-colored fluid was removed. Needle was removed, and a sterile dressing was placed. Limited postprocedure CT demonstrated no pneumothorax. Attention was then turned to the abdomen. Limited CT of the abdomen and pelvis was performed for planning purposes. This again demonstrated a small subhepatic fluid collection. Skin entry site was marked, and the overlying skin was prepped and draped in the standard sterile fashion. Local analgesia was obtained with 1% lidocaine. Using intermittent CT fluoroscopy, a 19 gauge Yueh catheter was advanced into the subhepatic fluid collection. Despite multiple needle repositioning, only a scant amount approximately 2 mL of thin yellow fluid was aspirated. Given lack of gross purulence, a drainage catheter was not placed. Needle was removed, and a clean dressing was placed. The patient tolerated all aspects of the procedure well without immediate complication. IMPRESSION: 1. Successful CT-guided right thoracentesis yielding that 100 mL of clear pleural fluid. 2. Successful CT-guided aspiration/sampling of subhepatic fluid collection yielding small volume of thin yellow fluid. Findings favor loculated ascites. Sample was sent for microbiology analysis. No drain placed at this time. Electronically Signed   By: Olive Bass M.D.   On: 01/10/2023 16:50   CT ASPIRATION N/S  Result Date: 01/10/2023 INDICATION: Pleural effusion, subhepatic fluid collection EXAM: 1. CT-guided right thoracentesis 2. CT-guided aspiration of intra-abdominal subhepatic fluid  collection MEDICATIONS: None. ANESTHESIA/SEDATION: Moderate (conscious) sedation was employed during this  procedure. A total of Versed 2 mg and Fentanyl 50 mcg was administered intravenously. Moderate Sedation Time: 22 minutes. The patient's level of consciousness and vital signs were monitored continuously by radiology nursing throughout the procedure under my direct supervision. FLUOROSCOPY TIME:  N/a COMPLICATIONS: None immediate. PROCEDURE: Informed written consent was obtained from the patient after a thorough discussion of the procedural risks, benefits and alternatives. All questions were addressed. Maximal Sterile Barrier Technique was utilized including caps, mask, sterile gowns, sterile gloves, sterile drape, hand hygiene and skin antiseptic. A timeout was performed prior to the initiation of the procedure. The patient was placed supine on the exam table. Limited CT of the chest was performed for planning purposes. This demonstrated a moderate right pleural effusion. Skin entry site was marked, the overlying skin was prepped and draped in the standard sterile fashion. Local analgesia was obtained with 1% lidocaine. Using intermittent CT fluoroscopy, a 19 gauge Yueh catheter was advanced into the right pleural fluid. Location was confirmed with CT. Approximately 100 mL of clear, straw-colored fluid was removed. Needle was removed, and a sterile dressing was placed. Limited postprocedure CT demonstrated no pneumothorax. Attention was then turned to the abdomen. Limited CT of the abdomen and pelvis was performed for planning purposes. This again demonstrated a small subhepatic fluid collection. Skin entry site was marked, and the overlying skin was prepped and draped in the standard sterile fashion. Local analgesia was obtained with 1% lidocaine. Using intermittent CT fluoroscopy, a 19 gauge Yueh catheter was advanced into the subhepatic fluid collection. Despite multiple needle repositioning, only a scant amount approximately 2 mL of thin yellow fluid was aspirated. Given lack of gross purulence, a drainage  catheter was not placed. Needle was removed, and a clean dressing was placed. The patient tolerated all aspects of the procedure well without immediate complication. IMPRESSION: 1. Successful CT-guided right thoracentesis yielding that 100 mL of clear pleural fluid. 2. Successful CT-guided aspiration/sampling of subhepatic fluid collection yielding small volume of thin yellow fluid. Findings favor loculated ascites. Sample was sent for microbiology analysis. No drain placed at this time. Electronically Signed   By: Olive Bass M.D.   On: 01/10/2023 16:50   IR Catheter Tube Change  Result Date: 01/08/2023 INDICATION: Persistent fluid collection status post percutaneous drainage EXAM: Drainage catheter exchange and repositioning using fluoroscopic guidance MEDICATIONS: Documented in the EMR ANESTHESIA/SEDATION: Local analgesia COMPLICATIONS: None immediate. PROCEDURE: Informed written consent was obtained from the patient after a thorough discussion of the procedural risks, benefits and alternatives. All questions were addressed. Maximal Sterile Barrier Technique was utilized including caps, mask, sterile gowns, sterile gloves, sterile drape, hand hygiene and skin antiseptic. A timeout was performed prior to the initiation of the procedure. The patient was placed supine on the exam table. The right upper quadrant was prepped and draped in the standard sterile fashion with inclusion of the existing percutaneous drainage catheter within the sterile field. Injection of the initial drainage catheter demonstrated communication with the more lateral and persistent component. Over an Amplatz wire, the existing drainage catheter was removed. Using combination of an angled catheter and wire, access into the more lateral undrained component was obtained. Location was confirmed with injection of contrast material. Over an Amplatz wire, a new 12 French locking drainage catheter was advanced into the fluid collection.  Location was again confirmed with injection of contrast material. Locking loop was formed, and the drainage catheter was secured  to the skin using silk suture and a dressing. It was attached to bulb suction. The patient tolerated the procedure well without immediate complication. IMPRESSION: Successful repositioning and exchange of the 12 French locking drainage catheter within the fluid collection near the hepatic dome. Drainage catheter placed to bulb suction. Electronically Signed   By: Olive Bass M.D.   On: 01/08/2023 11:06   CT ABDOMEN PELVIS W CONTRAST  Result Date: 01/07/2023 CLINICAL DATA:  Right lower quadrant abdominal pain EXAM: CT ABDOMEN AND PELVIS WITH CONTRAST TECHNIQUE: Multidetector CT imaging of the abdomen and pelvis was performed using the standard protocol following bolus administration of intravenous contrast. RADIATION DOSE REDUCTION: This exam was performed according to the departmental dose-optimization program which includes automated exposure control, adjustment of the mA and/or kV according to patient size and/or use of iterative reconstruction technique. CONTRAST:  OMNIPAQUE IOHEXOL 300 MG/ML  SOLN COMPARISON:  12/28/2022 FINDINGS: Lower chest: Large right pleural effusion with passive atelectasis. Borderline cardiomegaly. Mitral valve calcification. Coronary and descending thoracic aortic atherosclerotic vascular calcification. Hepatobiliary: Markedly reduced size of the anterior perihepatic abscess with a pigtail drainage catheter in place. Residual fluid density in this vicinity proximally 6.2 by 1.4 by 1.0 cm (volume = 4.5 cm^3), formerly 15.8 by 7.1 by 9.8 cm (volume = 580 cm^3) prior to drain placement. Tiny locules of gas within the collection. Along the inferior right hepatic lobe margin, a new fluid collection with enhancing margins measures 7.4 by 1.2 by 8.8 cm (volume = 41 cm^3) and is suspicious for early perihepatic abscess formation. This is shown for example  on image 52 series 5. Surrounding inflammatory stranding tracking along the right paracolic gutter region and right omentum as on image 48 series 2. Chronic relative atrophy in the lateral segment left hepatic lobe with some mild anterior hypoenhancement in the lateral segment which is chronic probably from remote scarring. The gallbladder is absent. Pancreas: Unremarkable Spleen: Unremarkable Adrenals/Urinary Tract: Stable nonobstructive right nephrolithiasis. Stable bilateral renal cysts not substantially changed over the last 10 days. No further imaging workup of these lesions is indicated. No hydronephrosis or hydroureter. The urinary bladder appears unremarkable. Stomach/Bowel: Small periampullary duodenal diverticulum. Vascular/Lymphatic: Atherosclerosis is present, including aortoiliac atherosclerotic disease. Atheromatous plaque noted at the origins of the celiac trunk and SMA without occlusion. Reproductive: Unremarkable Other: In addition to the inflammatory stranding along the right omentum and right paracolic gutter, there is asymmetric subcutaneous edema in the overlying soft tissues example on image 25 series 2, although no obvious connection between these different anatomic compartments. There is some cutaneous thickening in this region. Mild nonspecific presacral edema. Musculoskeletal: Mild lumbar spondylosis and degenerative disc disease. IMPRESSION: 1. Markedly reduced size of the anterior perihepatic abscess with a pigtail drainage catheter in place. 2. Along the inferior right hepatic lobe margin, there is a new 41 cc fluid collection with enhancing margins suspicious for early perihepatic abscess formation. Adjacent abnormal infiltrative edema tracks along the paracolic gutter and omentum, and there is overlying subcutaneous edema and cutaneous thickening along the right abdomen. 3. Large right pleural effusion with passive atelectasis. 4. Stable nonobstructive right nephrolithiasis. Aortic  Atherosclerosis (ICD10-I70.0). Electronically Signed   By: Gaylyn Rong M.D.   On: 01/07/2023 17:21   [place] EKG SITE RITE  Result Date: 01/02/2023 If Site Rite image not attached, placement could not be confirmed due to current cardiac rhythm.  DG Chest 2 View  Result Date: 01/01/2023 CLINICAL DATA:  Pleural effusion RIGHT EXAM: CHEST - 2 VIEW  COMPARISON:  12/29/2022 FINDINGS: Upper normal size of cardiac silhouette post CABG. Mediastinal contours and pulmonary vascularity normal. Atherosclerotic calcification aorta. Bibasilar atelectasis and trace pleural effusions. Upper lungs clear. No pneumothorax or acute osseous findings. IMPRESSION: Bibasilar atelectasis and trace pleural effusions. Aortic Atherosclerosis (ICD10-I70.0). Electronically Signed   By: Ulyses Southward M.D.   On: 01/01/2023 10:51   ECHOCARDIOGRAM COMPLETE  Result Date: 12/30/2022    ECHOCARDIOGRAM REPORT   Patient Name:   Fate Caster. Date of Exam: 12/30/2022 Medical Rec #:  [id number]            Height:       70.5 in Accession #:    [id number]           Weight:       217.0 lb Date of Birth:  [birth year]           BSA:          2.172 m Patient Age:    81 years             BP:           111/59 mmHg Patient Gender: M                    HR:           88 bpm. Exam Location:  [facility] Procedure: 2D Echo Indications:     Dyspnea R06.00                  Endocarditis I38  History:         Patient has prior history of Echocardiogram examinations, most                  recent 01/11/2022.  Sonographer:     Overton Mam RDCS, FASE Referring Phys:  [id number] AMY N [facility] Diagnosing Phys: Debbe Odea MD  Sonographer Comments: Technically difficult study due to poor echo windows and no subcostal window. Image acquisition challenging due to respiratory motion. IMPRESSIONS  1. Left ventricular ejection fraction, by estimation, is 50 to 55%. Left ventricular ejection fraction by PLAX is 50 %. The left ventricle has low normal function. The left  ventricle has no regional wall motion abnormalities. Left ventricular diastolic parameters are consistent with Grade I diastolic dysfunction (impaired relaxation).  2. Right ventricular systolic function is normal. The right ventricular size is normal.  3. The mitral valve is degenerative. Mild mitral valve regurgitation. Moderate mitral annular calcification.  4. Aortic valve mean gradient 19 mmHg, peak gradient 35 mmHg, vmax 2.27m/s. The aortic valve is calcified. Aortic valve regurgitation is mild. Mild to moderate aortic valve stenosis.  5. Aortic dilatation noted. There is mild dilatation of the aortic root, measuring 40 mm. FINDINGS  Left Ventricle: Left ventricular ejection fraction, by estimation, is 50 to 55%. Left ventricular ejection fraction by PLAX is 50 %. The left ventricle has low normal function. The left ventricle has no regional wall motion abnormalities. The left ventricular internal cavity size was normal in size. There is no left ventricular hypertrophy. Left ventricular diastolic parameters are consistent with Grade I diastolic dysfunction (impaired relaxation). Right Ventricle: The right ventricular size is normal. No increase in right ventricular wall thickness. Right ventricular systolic function is normal. Left Atrium: Left atrial size was normal in size. Right Atrium: Right atrial size was normal in size. Pericardium: There is no evidence of pericardial effusion. Mitral Valve: The mitral valve is degenerative in appearance. Moderate mitral annular calcification.  Mild mitral valve regurgitation. MV peak gradient, 5.7 mmHg. The mean mitral valve gradient is 2.0 mmHg. Tricuspid Valve: The tricuspid valve is grossly normal. Tricuspid valve regurgitation is mild. Aortic Valve: Aortic valve mean gradient 19 mmHg, peak gradient 35 mmHg, vmax 2.61m/s. The aortic valve is calcified. Aortic valve regurgitation is mild. Aortic regurgitation PHT measures 638 msec. Mild to moderate aortic stenosis is  present. Aortic valve  mean gradient measures 15.0 mmHg. Aortic valve peak gradient measures 28.0 mmHg. Aortic valve area, by VTI measures 2.83 cm. Pulmonic Valve: The pulmonic valve was normal in structure. Pulmonic valve regurgitation is mild. Aorta: Aortic dilatation noted. There is mild dilatation of the aortic root, measuring 40 mm. Venous: The inferior vena cava was not well visualized. IAS/Shunts: No atrial level shunt detected by color flow Doppler.  LEFT VENTRICLE PLAX 2D LV EF:         Left            Diastology                ventricular     LV e' medial:    3.70 cm/s                ejection        LV E/e' medial:  18.0                fraction by     LV e' lateral:   12.50 cm/s                PLAX is 50      LV E/e' lateral: 5.3                %. LVIDd:         4.70 cm LVIDs:         3.50 cm LV PW:         1.20 cm LV IVS:        1.20 cm LVOT diam:     2.40 cm LV SV:         127 LV SV Index:   58 LVOT Area:     4.52 cm  LV Volumes (MOD) LV vol d, MOD    104.0 ml A4C: LV vol s, MOD    41.3 ml A4C: LV SV MOD A4C:   104.0 ml RIGHT VENTRICLE RV Basal diam:  2.60 cm RV S prime:     15.20 cm/s LEFT ATRIUM             Index        RIGHT ATRIUM           Index LA diam:        3.50 cm 1.61 cm/m   RA Area:     13.20 cm LA Vol (A2C):   35.2 ml 16.20 ml/m  RA Volume:   33.00 ml  15.19 ml/m LA Vol (A4C):   43.7 ml 20.12 ml/m LA Biplane Vol: 41.5 ml 19.10 ml/m  AORTIC VALVE                     PULMONIC VALVE AV Area (Vmax):    2.12 cm      PV Vmax:        1.78 m/s AV Area (Vmean):   2.00 cm      PV Peak grad:   12.7 mmHg AV Area (VTI):     2.83 cm      RVOT Peak grad: 2  mmHg AV Vmax:           264.67 cm/s AV Vmean:          179.333 cm/s AV VTI:            0.448 m AV Peak Grad:      28.0 mmHg AV Mean Grad:      15.0 mmHg LVOT Vmax:         124.00 cm/s LVOT Vmean:        79.200 cm/s LVOT VTI:          0.280 m LVOT/AV VTI ratio: 0.62 AI PHT:            638 msec  AORTA Ao Root diam: 4.00 cm MITRAL VALVE MV Area  (PHT): 2.28 cm     SHUNTS MV Area VTI:   4.01 cm     Systemic VTI:  0.28 m MV Peak grad:  5.7 mmHg     Systemic Diam: 2.40 cm MV Mean grad:  2.0 mmHg MV Vmax:       1.19 m/s MV Vmean:      59.3 cm/s MV Decel Time: 333 msec MV E velocity: 66.60 cm/s MV A velocity: 119.00 cm/s MV E/A ratio:  0.56 Debbe Odea MD Electronically signed by Debbe Odea MD Signature Date/Time: 12/30/2022/4:15:35 PM    Final    IR THORACENTESIS ASP PLEURAL SPACE W/IMG GUIDE  Result Date: 12/29/2022 INDICATION: 82 year old with large perihepatic abscess and right pleural effusion. EXAM: 1. Ultrasound-guided placement of drainage catheter in the perihepatic abscess 2. Ultrasound-guided right thoracentesis MEDICATIONS: Moderate sedation ANESTHESIA/SEDATION: Moderate (conscious) sedation was employed during this procedure. A total of Versed 2mg  and fentanyl 50 mcg was administered intravenously at the order of the provider performing the procedure. Total intra-service moderate sedation time: 47 minutes. Patient's level of consciousness and vital signs were monitored continuously by radiology nurse throughout the procedure under the supervision of the provider performing the procedure. COMPLICATIONS: None immediate. PROCEDURE: Informed written consent was obtained from the patient after a thorough discussion of the procedural risks, benefits and alternatives. All questions were addressed. Maximal Sterile Barrier Technique was utilized including caps, mask, sterile gowns, sterile gloves, sterile drape, hand hygiene and skin antiseptic. A timeout was performed prior to the initiation of the procedure. Right upper abdomen was evaluated with ultrasound. The large perihepatic abscess was identified. The right upper abdomen was prepped with chlorhexidine and sterile field was created. Skin was anesthetized using 1% lidocaine. Small incision was made. Using ultrasound guidance, Yueh catheter was directed into the large perihepatic  abscess. Yellow purulent fluid was aspirated. Superstiff Amplatz wire was placed. The tract was dilated to accommodate a 12 [place] multipurpose drain. 550 mL of purulent fluid was removed. Drain was sutured to the skin and attached to a suction bulb. Fluid was sent for culture. The right posterior chest was evaluated with ultrasound. Right pleural fluid was identified with ultrasound. A new sterile tray was opened. Right posterior chest was prepped with chlorhexidine and a sterile field was created. Skin was anesthetized using 1% lidocaine. Small incision was made. Using ultrasound guidance, a Safe-T-Centesis catheter was directed into the right pleural space. Yellow fluid was aspirated. 900 mL of fluid was removed. Catheter was removed and a bandage was placed. FINDINGS: 550 mL of purulent fluid was removed from the perihepatic collection. Perihepatic abscess appeared to be decompressed at the end of the procedure. 900 mL of yellow fluid was removed from the right pleural space. IMPRESSION: 1. Ultrasound-guided  placement of a drainage catheter in the large perihepatic abscess. 2. Ultrasound-guided right thoracentesis. Electronically Signed   By: Richarda Overlie M.D.   On: 12/29/2022 20:00   IR IMAGE GUIDED DRAINAGE BY PERCUTANEOUS CATHETER  Result Date: 12/29/2022 INDICATION: 82 year old with large perihepatic abscess and right pleural effusion. EXAM: 1. Ultrasound-guided placement of drainage catheter in the perihepatic abscess 2. Ultrasound-guided right thoracentesis MEDICATIONS: Moderate sedation ANESTHESIA/SEDATION: Moderate (conscious) sedation was employed during this procedure. A total of Versed 2mg  and fentanyl 50 mcg was administered intravenously at the order of the provider performing the procedure. Total intra-service moderate sedation time: 47 minutes. Patient's level of consciousness and vital signs were monitored continuously by radiology nurse throughout the procedure under the supervision of the  provider performing the procedure. COMPLICATIONS: None immediate. PROCEDURE: Informed written consent was obtained from the patient after a thorough discussion of the procedural risks, benefits and alternatives. All questions were addressed. Maximal Sterile Barrier Technique was utilized including caps, mask, sterile gowns, sterile gloves, sterile drape, hand hygiene and skin antiseptic. A timeout was performed prior to the initiation of the procedure. Right upper abdomen was evaluated with ultrasound. The large perihepatic abscess was identified. The right upper abdomen was prepped with chlorhexidine and sterile field was created. Skin was anesthetized using 1% lidocaine. Small incision was made. Using ultrasound guidance, Yueh catheter was directed into the large perihepatic abscess. Yellow purulent fluid was aspirated. Superstiff Amplatz wire was placed. The tract was dilated to accommodate a 12 [place] multipurpose drain. 550 mL of purulent fluid was removed. Drain was sutured to the skin and attached to a suction bulb. Fluid was sent for culture. The right posterior chest was evaluated with ultrasound. Right pleural fluid was identified with ultrasound. A new sterile tray was opened. Right posterior chest was prepped with chlorhexidine and a sterile field was created. Skin was anesthetized using 1% lidocaine. Small incision was made. Using ultrasound guidance, a Safe-T-Centesis catheter was directed into the right pleural space. Yellow fluid was aspirated. 900 mL of fluid was removed. Catheter was removed and a bandage was placed. FINDINGS: 550 mL of purulent fluid was removed from the perihepatic collection. Perihepatic abscess appeared to be decompressed at the end of the procedure. 900 mL of yellow fluid was removed from the right pleural space. IMPRESSION: 1. Ultrasound-guided placement of a drainage catheter in the large perihepatic abscess. 2. Ultrasound-guided right thoracentesis. Electronically Signed    By: Richarda Overlie M.D.   On: 12/29/2022 20:00   DG Chest Port 1 View  Result Date: 12/29/2022 CLINICAL DATA:  Post thoracentesis. EXAM: PORTABLE CHEST 1 VIEW COMPARISON:  12/28/2022 FINDINGS: Sternotomy wires unchanged. Lungs are adequately inflated with interval improvement and previously seen right pleural effusion as no residual pleural fluid is identified. No evidence of right-sided pneumothorax. Left lung is clear. Cardiomediastinal silhouette and remainder of the exam is unchanged. IMPRESSION: Interval improvement in previously seen right pleural effusion post thoracentesis. No pneumothorax. Electronically Signed   By: Elberta Fortis M.D.   On: 12/29/2022 16:02   CT Angio Chest PE W and/or Wo Contrast  Result Date: 12/28/2022 CLINICAL DATA:  Headache, short of breath, weakness, history of endocarditis, abdominal pain, nonlocalized right upper quadrant pain EXAM: CT ANGIOGRAPHY CHEST CT ABDOMEN AND PELVIS WITH CONTRAST TECHNIQUE: Multidetector CT imaging of the chest was performed using the standard protocol during bolus administration of intravenous contrast. Multiplanar CT image reconstructions and MIPs were obtained to evaluate the vascular anatomy. Multidetector CT imaging of the abdomen and  pelvis was performed using the standard protocol during bolus administration of intravenous contrast. RADIATION DOSE REDUCTION: This exam was performed according to the departmental dose-optimization program which includes automated exposure control, adjustment of the mA and/or kV according to patient size and/or use of iterative reconstruction technique. CONTRAST:  OMNIPAQUE IOHEXOL 350 MG/ML SOLN COMPARISON:  05/10/2022, 10/23/2021 FINDINGS: CTA CHEST FINDINGS Cardiovascular: This is a technically adequate evaluation of the pulmonary vasculature. No filling defects or pulmonary emboli. The heart is enlarged, with no evidence of pericardial effusion. There is calcification of the aortic and mitral valves.  Normal caliber of the thoracic aorta. No evidence of aneurysm or dissection. Postsurgical changes from prior CABG. Atherosclerosis of the aorta and native coronary vessels. Mediastinum/Nodes: No enlarged mediastinal, hilar, or axillary lymph nodes. Thyroid gland, trachea, and esophagus demonstrate no significant findings. Lungs/Pleura: There is a large right pleural effusion, volume estimated in excess of 1 L. minimal compressive atelectasis of the right lower lobe. No airspace disease or pneumothorax. Minimal mucoid material within the trachea. Otherwise the central airways are patent. Musculoskeletal: No acute or destructive bony lesions. Reconstructed images demonstrate no additional findings. Review of the MIP images confirms the above findings. CT ABDOMEN and PELVIS FINDINGS Hepatobiliary: Since the prior exam, the complex fluid collection along the ventral aspect of the liver has enlarged, measuring 15.8 x 8.6 by 9.8 cm on today's exam. Continued mural thickening with internal septations concerning for subcapsular or Peri hepatic abscess. This is amenable to percutaneous sampling if not previously performed. The remainder of the liver is unremarkable. Gallbladder is surgically absent. No biliary duct dilation. Pancreas: Unremarkable. No pancreatic ductal dilatation or surrounding inflammatory changes. Spleen: Normal in size without focal abnormality. Adrenals/Urinary Tract: Stable bilateral renal cysts do not require specific imaging follow-up. Stable nonobstructing right renal calculi, measuring up to 5 mm. No obstructive uropathy within either kidney. The adrenals and bladder are unremarkable. Stomach/Bowel: No bowel obstruction or ileus. Diffuse colonic diverticulosis without evidence of diverticulitis. No bowel wall thickening. Vascular/Lymphatic: Aortic atherosclerosis. No enlarged abdominal or pelvic lymph nodes. Reproductive: Mild enlargement of the prostate unchanged. Other: No free fluid or free  intraperitoneal gas. Evidence of prior umbilical hernia repair. Stable fat containing bilateral inguinal hernias. No bowel herniation. Musculoskeletal: No acute or destructive bony lesions. Reconstructed images demonstrate no additional findings. Review of the MIP images confirms the above findings. IMPRESSION: Chest: 1. No evidence of pulmonary embolus. 2. Large right pleural effusion, volume estimated in excess of 1 L. 3. Cardiomegaly. 4. Aortic and mitral valve calcifications. 5.  Aortic Atherosclerosis (ICD10-I70.0). Abdomen/pelvis: 1. Enlarging 15.8 x 8.6 x 9.8 cm complex fluid collection along the ventral margin of the liver, increased since prior exam and consistent with enlarging subcapsular or perihepatic abscess. This is amenable to percutaneous aspiration/drainage. 2. Nonobstructing right renal calculi. 3. Diffuse colonic diverticulosis without diverticulitis. 4.  Aortic Atherosclerosis (ICD10-I70.0). Electronically Signed   By: Sharlet Salina M.D.   On: 12/28/2022 14:57   CT ABDOMEN PELVIS W CONTRAST  Result Date: 12/28/2022 CLINICAL DATA:  Headache, short of breath, weakness, history of endocarditis, abdominal pain, nonlocalized right upper quadrant pain EXAM: CT ANGIOGRAPHY CHEST CT ABDOMEN AND PELVIS WITH CONTRAST TECHNIQUE: Multidetector CT imaging of the chest was performed using the standard protocol during bolus administration of intravenous contrast. Multiplanar CT image reconstructions and MIPs were obtained to evaluate the vascular anatomy. Multidetector CT imaging of the abdomen and pelvis was performed using the standard protocol during bolus administration of intravenous contrast. RADIATION DOSE  REDUCTION: This exam was performed according to the departmental dose-optimization program which includes automated exposure control, adjustment of the mA and/or kV according to patient size and/or use of iterative reconstruction technique. CONTRAST:  OMNIPAQUE IOHEXOL 350 MG/ML SOLN  COMPARISON:  05/10/2022, 10/23/2021 FINDINGS: CTA CHEST FINDINGS Cardiovascular: This is a technically adequate evaluation of the pulmonary vasculature. No filling defects or pulmonary emboli. The heart is enlarged, with no evidence of pericardial effusion. There is calcification of the aortic and mitral valves. Normal caliber of the thoracic aorta. No evidence of aneurysm or dissection. Postsurgical changes from prior CABG. Atherosclerosis of the aorta and native coronary vessels. Mediastinum/Nodes: No enlarged mediastinal, hilar, or axillary lymph nodes. Thyroid gland, trachea, and esophagus demonstrate no significant findings. Lungs/Pleura: There is a large right pleural effusion, volume estimated in excess of 1 L. minimal compressive atelectasis of the right lower lobe. No airspace disease or pneumothorax. Minimal mucoid material within the trachea. Otherwise the central airways are patent. Musculoskeletal: No acute or destructive bony lesions. Reconstructed images demonstrate no additional findings. Review of the MIP images confirms the above findings. CT ABDOMEN and PELVIS FINDINGS Hepatobiliary: Since the prior exam, the complex fluid collection along the ventral aspect of the liver has enlarged, measuring 15.8 x 8.6 by 9.8 cm on today's exam. Continued mural thickening with internal septations concerning for subcapsular or Peri hepatic abscess. This is amenable to percutaneous sampling if not previously performed. The remainder of the liver is unremarkable. Gallbladder is surgically absent. No biliary duct dilation. Pancreas: Unremarkable. No pancreatic ductal dilatation or surrounding inflammatory changes. Spleen: Normal in size without focal abnormality. Adrenals/Urinary Tract: Stable bilateral renal cysts do not require specific imaging follow-up. Stable nonobstructing right renal calculi, measuring up to 5 mm. No obstructive uropathy within either kidney. The adrenals and bladder are unremarkable.  Stomach/Bowel: No bowel obstruction or ileus. Diffuse colonic diverticulosis without evidence of diverticulitis. No bowel wall thickening. Vascular/Lymphatic: Aortic atherosclerosis. No enlarged abdominal or pelvic lymph nodes. Reproductive: Mild enlargement of the prostate unchanged. Other: No free fluid or free intraperitoneal gas. Evidence of prior umbilical hernia repair. Stable fat containing bilateral inguinal hernias. No bowel herniation. Musculoskeletal: No acute or destructive bony lesions. Reconstructed images demonstrate no additional findings. Review of the MIP images confirms the above findings. IMPRESSION: Chest: 1. No evidence of pulmonary embolus. 2. Large right pleural effusion, volume estimated in excess of 1 L. 3. Cardiomegaly. 4. Aortic and mitral valve calcifications. 5.  Aortic Atherosclerosis (ICD10-I70.0). Abdomen/pelvis: 1. Enlarging 15.8 x 8.6 x 9.8 cm complex fluid collection along the ventral margin of the liver, increased since prior exam and consistent with enlarging subcapsular or perihepatic abscess. This is amenable to percutaneous aspiration/drainage. 2. Nonobstructing right renal calculi. 3. Diffuse colonic diverticulosis without diverticulitis. 4.  Aortic Atherosclerosis (ICD10-I70.0). Electronically Signed   By: Sharlet Salina M.D.   On: 12/28/2022 14:57   CT HEAD WO CONTRAST ( )  Result Date: 12/28/2022 CLINICAL DATA:  Headache, shortness of breath, weakness, on Eliquis EXAM: CT HEAD WITHOUT CONTRAST TECHNIQUE: Contiguous axial images were obtained from the base of the skull through the vertex without intravenous contrast. RADIATION DOSE REDUCTION: This exam was performed according to the departmental dose-optimization program which includes automated exposure control, adjustment of the mA and/or kV according to patient size and/or use of iterative reconstruction technique. COMPARISON:  10/23/2021 FINDINGS: Brain: No evidence of acute infarction, hemorrhage, mass, mass  effect, or midline shift. No hydrocephalus or extra-axial fluid collection. Periventricular white matter changes, likely the  sequela of chronic small vessel ischemic disease. Cerebral volume is within normal limits for age. Vascular: No hyperdense vessel. Atherosclerotic calcifications in the intracranial carotid and vertebral arteries. Skull: Negative for fracture or focal lesion. Sinuses/Orbits: No acute finding. Status post bilateral lens replacements. Other: The mastoid air cells are well aerated. IMPRESSION: No acute intracranial process. Electronically Signed   By: Wiliam Ke M.D.   On: 12/28/2022 14:46   DG Chest 2 View  Result Date: 12/28/2022 CLINICAL DATA:  SOB EXAM: CHEST - 2 VIEW COMPARISON:  12/26/2022. FINDINGS: Enlarged cardiac silhouette. Small right-sided pleural effusion. No pneumothorax. Calcified aorta. Status post median sternotomy and CABG. IMPRESSION: Enlarged cardiac silhouette.  Small right-sided pleural effusion. Electronically Signed   By: Layla Maw M.D.   On: 12/28/2022 12:36   DG Chest 2 View  Result Date: 12/26/2022 CLINICAL DATA:  Dyspnea. Pleural effusion post thoracentesis last week. EXAM: CHEST - 2 VIEW COMPARISON:  Radiographs 12/22/2022 and 12/21/2022.  CT 10/23/2021. FINDINGS: The heart size and mediastinal contours are stable status post median sternotomy and CABG. Small residual right pleural effusion without significant recurrence. There is no pneumothorax, edema or confluent airspace opacity. The bones appear unchanged. IMPRESSION: Small residual right pleural effusion without significant recurrence. No acute cardiopulmonary process. Electronically Signed   By: Carey Bullocks M.D.   On: 12/26/2022 13:09   US THORACENTESIS ASP PLEURAL SPACE W/IMG GUIDE  Result Date: 12/22/2022 INDICATION: Right pleural effusion EXAM: ULTRASOUND GUIDED RIGHT THORACENTESIS MEDICATIONS: 8 cc 1% lidocaine COMPLICATIONS: None immediate. PROCEDURE: An ultrasound guided  thoracentesis was thoroughly discussed with the patient and questions answered. The benefits, risks, alternatives and complications were also discussed. The patient understands and wishes to proceed with the procedure. Written consent was obtained. Ultrasound was performed to localize and mark an adequate pocket of fluid in the right chest. The area was then prepped and draped in the normal sterile fashion. 1% Lidocaine was used for local anesthesia. Under ultrasound guidance a 6 Fr Safe-T-Centesis catheter was introduced. Thoracentesis was performed. The catheter was removed and a dressing applied. FINDINGS: A total of approximately 1.0 L of cloudy yellow fluid was removed. Samples were sent to the laboratory as requested by the clinical team. IMPRESSION: Successful ultrasound guided right thoracentesis yielding 1.0 L of pleural fluid. Follow-up chest x-ray revealed no evidence of pneumothorax. Read by: Mina Marble, PA-C Electronically Signed   By: Judie Petit.  Shick M.D.   On: 12/22/2022 14:45   DG Chest 2 View  Result Date: 12/22/2022 CLINICAL DATA:  82 year old male with a history of right pleural effusion status post thoracentesis in February. Recurrence. EXAM: CHEST - 2 VIEW COMPARISON:  Post thoracentesis portable chest x-ray 10/13/2022 and earlier. FINDINGS: PA and lateral views on 12/21/2022. Previous CABG. Stable cardiac size and mediastinal contours. Visualized tracheal air column is within normal limits. The left lung appears stable and clear. Small to moderate right pleural effusion appears to be layering, and is increased since February. No pneumothorax or pulmonary edema. No acute osseous abnormality identified. Negative visible bowel gas. IMPRESSION: 1. Small to moderate right pleural effusion, progressed since February. 2. No other acute cardiopulmonary abnormality. Electronically Signed   By: Odessa Fleming M.D.   On: 12/22/2022 14:25   DG Chest Port 1 View  Result Date: 12/22/2022 CLINICAL DATA:   82 year old male status post ultrasound guided right side thoracentesis this afternoon. EXAM: PORTABLE CHEST 1 VIEW COMPARISON:  Ultrasound thoracentesis images 13 38 hours. Two view chest yesterday and earlier. FINDINGS: Portable AP  upright view at 1410 hours. Substantially regressed, nearly resolved right pleural effusion. No pneumothorax. Stable cardiac size and mediastinal contours. Prior CABG. The left lung remains clear when allowing portable technique. No acute osseous abnormality identified. Paucity of bowel gas in the visible abdomen. IMPRESSION: Nearly resolved right pleural effusion following thoracentesis. No pneumothorax or other acute cardiopulmonary abnormality. Electronically Signed   By: Odessa Fleming M.D.   On: 12/22/2022 14:23     I have personally spent at least 50 minutes involved in face-to-face and non-face-to-face activities for this patient on the day of the visit. Professional time spent includes the following activities: Preparing to see the patient (review of tests), Obtaining and/or reviewing separately obtained history (admission/discharge record), Performing a medically appropriate examination and/or evaluation , Ordering medications/tests/procedures, referring and communicating with other health care professionals, Documenting clinical information in the EMR, Independently interpreting results (not separately reported), Communicating results to the patient/family/caregiver, Counseling and educating the patient/family/caregiver and Care coordination (not separately reported).   Plan d/w requesting provider as well as ID pharm D  Note: This document was prepared using dragon voice recognition software and may include unintentional dictation errors.   Electronically signed by:   Odette Fraction, MD Infectious Disease Physician [facility] Pager: [phone]

## 2023-01-11 NOTE — Progress Notes (Addendum)
Pharmacy Antibiotic Note  [name] Young. is a 82 y.o. male admitted on 01/07/2023 with abdominal pain.  Pharmacy has been consulted for Zosyn dosing for recurrent hepatic abscess.  Plan:.  Discontinue Zosyn. Initiate rocephin 2g IV every 24 hours and flagyl 500mg  PO BID per instruction from ID Follow renal function and cultures for needed adjustments   Height: 5\' 10"  (177.8 cm) Weight: 99.8 kg (220 lb 0.3 oz) IBW/kg (Calculated) : 73  Temp (24hrs), Avg:98 F (36.7 C), Min:97.6 F (36.4 C), Max:98.4 F (36.9 C)  Recent Labs  Lab 01/07/23 1541 01/08/23 0440 01/09/23 0601 01/10/23 0604 01/11/23 0506  WBC 7.9 7.9 5.1 4.9 5.2  CREATININE 0.95 0.88 0.91 0.82 0.90  LATICACIDVEN 1.3  --   --   --   --      Estimated Creatinine Clearance: 77.5 mL/min (by C-G formula based on SCr of 0.9 mg/dL).    No Known Allergies  Antimicrobials this admission: vancomycin 4/28 >> 4/29 zosyn 4/28 >>   Dose adjustments this admission: N/a  Microbiology results: 4/29: Body fluid from abd: pending 4/30 bcx: ngtd  Thank you for allowing pharmacy to be a part of this patient's care.  Selinda Eon, PharmD Clinical Pharmacist  01/11/2023 8:03 PM

## 2023-01-11 NOTE — TOC Progression Note (Signed)
Transition of Care (TOC) - Progression Note    Patient Details  Name: [name] Young. MRN: [id number] Date of Birth: Jan 21, 1941  Transition [facility] Care [facility]) CM/SW Contact  Chapman Fitch, RN Phone Number: 01/11/2023, 10:11 AM  Clinical Narrative:      Secure chat message sent to MD to determine if patient is medically ready for discharge to SNF      Expected Discharge Plan and Services                                               Social Determinants of Health (SDOH) Interventions SDOH Screenings   Food Insecurity: Patient Declined (12/26/2022)  Housing: Low Risk  (12/26/2022)  Transportation Needs: No Transportation Needs (12/26/2022)  Depression (PHQ2-9): Medium Risk (12/26/2022)  Financial Resource Strain: Low Risk  (12/26/2022)  Physical Activity: Unknown (12/26/2022)  Social Connections: Unknown (12/26/2022)  Stress: No Stress Concern Present (12/26/2022)  Tobacco Use: Medium Risk (01/08/2023)    Readmission Risk Interventions     No data to display

## 2023-01-11 NOTE — TOC Progression Note (Signed)
Transition of Care (TOC) - Progression Note    Patient Details  Name: [name] Young. MRN: [id number] Date of Birth: 1941/01/06  Transition of Care [facility]) CM/SW Contact  Chapman Fitch, RN Phone Number: 01/11/2023, 1:33 PM  Clinical Narrative:     Notified by MD that patient is now considering going home Met with patient and wife [name] Patient states that he would like to go home.  I will follow up with them tomorrow to confirm that is the final plan.  Wife states that she has given antibiotics IV at home in the past. Patient has had [facility] in the past, they state they do not have a preferance of agency.  Referral made to [facility] with Frances Furbish, and Pam with [facility]        Expected Discharge Plan and Services                                               Social Determinants of Health (SDOH) Interventions SDOH Screenings   Food Insecurity: Patient Declined (12/26/2022)  Housing: Low Risk  (12/26/2022)  Transportation Needs: No Transportation Needs (12/26/2022)  Depression (PHQ2-9): Medium Risk (12/26/2022)  Financial Resource Strain: Low Risk  (12/26/2022)  Physical Activity: Unknown (12/26/2022)  Social Connections: Unknown (12/26/2022)  Stress: No Stress Concern Present (12/26/2022)  Tobacco Use: Medium Risk (01/08/2023)    Readmission Risk Interventions     No data to display

## 2023-01-11 NOTE — Progress Notes (Signed)
Referring Physician(s): * No referring provider recorded for this case *  Supervising Physician: Roanna Banning  Patient Status:  [facility] - In-pt  Chief Complaint:  Hepatic abscess s/p drain placement 12/29/22 with additional aspiration and drain repositioning 01/08/23  Subjective:  Patient alert and sitting comfortably in bed at time of exam. He is accompanied by his wife today. They report mild RUQ discomfort, but state that it is not at his drain site in particular.   Allergies: Patient has no known allergies.  Medications: Prior to Admission medications   Medication Sig Start Date End Date Taking? Authorizing Provider  acetaminophen (TYLENOL) 325 MG tablet Take 1 tablet (325 mg total) by mouth at bedtime as needed (sleep aid). 01/05/23  Yes Wieting, Richard, MD  apixaban (ELIQUIS) 5 MG TABS tablet TAKE 1 TABLET BY MOUTH TWICE A DAY 08/23/22  Yes End, [name], MD  cefTRIAXone (ROCEPHIN) IVPB Inject 2 g into the vein daily for 19 days. Indication:  E coli liver abscess Last Day of Therapy:  01/25/2023 Labs - Once weekly on Mondays:  CBC/D and CMP Please pull PIC at completion of IV antibiotics Fax weekly lab results  promptly to [phone] Method of administration: IV Push Method of administration may be changed at the discretion of facility/pharmacy 01/06/23 01/25/23 Yes Wieting, Richard, MD  Cholecalciferol (VITAMIN D3) 25 MCG (1000 UT) CAPS Take 1 capsule (1,000 Units total) by mouth daily. 05/24/20  Yes [name], MD  Cyanocobalamin (B-12) 1000 MCG CAPS Take by mouth daily at 2 am.   Yes [provider]  diphenhydrAMINE (BENADRYL) 25 mg capsule Take 1 capsule (25 mg total) by mouth at bedtime as needed for sleep. 01/05/23  Yes Wieting, Richard, MD  FARXIGA 5 MG TABS tablet Take 5 mg by mouth daily. 10/27/22  Yes [provider]  feeding supplement (ENSURE ENLIVE / ENSURE PLUS) LIQD Take 237 mLs by mouth 2 (two) times daily between meals. 01/05/23  Yes  Wieting, Richard, MD  mirtazapine (REMERON) 7.5 MG tablet Take 1 tablet (7.5 mg total) by mouth at bedtime. 01/05/23  Yes Wieting, Richard, MD  Multiple Vitamins-Minerals (MACULAR VITAMIN BENEFIT PO) Take 1 tablet by mouth daily.   Yes [provider]  nitroGLYCERIN (NITROSTAT) 0.4 MG SL tablet Place 1 tablet (0.4 mg total) under the tongue every 5 (five) minutes as needed for chest pain. 03/26/20  Yes End, [name], MD  oxyCODONE-acetaminophen (PERCOCET/ROXICET) 5-325 MG tablet Take 1 tablet by mouth every 6 (six) hours as needed for moderate pain. 01/05/23  Yes Wieting, Richard, MD  pantoprazole (PROTONIX) 40 MG tablet Take 1 tablet (40 mg total) by mouth daily. 01/06/23  Yes Wieting, Richard, MD  rosuvastatin (CRESTOR) 10 MG tablet TAKE 1 TABLET BY MOUTH EVERY DAY 06/15/22  Yes End, [name], MD  sacubitril-valsartan (ENTRESTO) 24-26 MG Take 1 tablet by mouth 2 (two) times daily. Hold for SBP less than 100 01/05/23  Yes Wieting, Richard, MD  senna-docusate (SENOKOT-S) 8.6-50 MG tablet Take 1 tablet by mouth at bedtime as needed for mild constipation. 01/05/23  Yes Wieting, Richard, MD  sodium chloride flush (NS) 0.9 % SOLN 5 mLs by Intracatheter route every 8 (eight) hours. 01/05/23  Yes Wieting, Richard, MD  tamsulosin (FLOMAX) 0.4 MG CAPS capsule TAKE 1 CAPSULE BY MOUTH EVERY DAY 09/18/22  Yes [name], MD  furosemide (LASIX) 40 MG tablet Take 1 tablet (40 mg total) by mouth daily as needed (Shortness of breath, or weight gain of 3 pounds in a  day or 5 pounds in a week.). Patient not taking: Reported on 01/07/2023 01/05/23   [name], MD     Vital Signs: BP 130/65 (BP Location: Right Arm)   Pulse 77   Temp 98.1 F (36.7 C)   Resp 18   Ht 5\' 10"  (1.778 m)   Wt 220 lb 0.3 oz (99.8 kg)   SpO2 94%   BMI 31.57 kg/m   Physical Exam Vitals reviewed.  Constitutional:      General: He is not in acute distress.    Appearance: He is ill-appearing.  Cardiovascular:      Pulses: Normal pulses.  Pulmonary:     Effort: Pulmonary effort is normal.  Abdominal:     Tenderness: There is no abdominal tenderness.     Comments: RUQ drain in place. Scant serosanguineous fluid in JP bulb at time of exam. Suture intact. Site is appropriately dressed and dressing is clean, dry, and intact. No sign of bleeding, induration, erythema, or drainage from drain insertion site. Drain flushes easily but aspirates with some effort.   Skin:    General: Skin is warm and dry.  Neurological:     Mental Status: He is alert and oriented to person, place, and time.  Psychiatric:        Mood and Affect: Mood normal.        Behavior: Behavior normal.     Imaging: CT ASPIRATION N/S  Result Date: 01/10/2023 INDICATION: Pleural effusion, subhepatic fluid collection EXAM: 1. CT-guided right thoracentesis 2. CT-guided aspiration of intra-abdominal subhepatic fluid collection MEDICATIONS: None. ANESTHESIA/SEDATION: Moderate (conscious) sedation was employed during this procedure. A total of Versed 2 mg and Fentanyl 50 mcg was administered intravenously. Moderate Sedation Time: 22 minutes. The patient's level of consciousness and vital signs were monitored continuously by radiology nursing throughout the procedure under my direct supervision. FLUOROSCOPY TIME:  N/a COMPLICATIONS: None immediate. PROCEDURE: Informed written consent was obtained from the patient after a thorough discussion of the procedural risks, benefits and alternatives. All questions were addressed. Maximal Sterile Barrier Technique was utilized including caps, mask, sterile gowns, sterile gloves, sterile drape, hand hygiene and skin antiseptic. A timeout was performed prior to the initiation of the procedure. The patient was placed supine on the exam table. Limited CT of the chest was performed for planning purposes. This demonstrated a moderate right pleural effusion. Skin entry site was marked, the overlying skin was prepped and  draped in the standard sterile fashion. Local analgesia was obtained with 1% lidocaine. Using intermittent CT fluoroscopy, a 19 gauge Yueh catheter was advanced into the right pleural fluid. Location was confirmed with CT. Approximately 100 mL of clear, straw-colored fluid was removed. Needle was removed, and a sterile dressing was placed. Limited postprocedure CT demonstrated no pneumothorax. Attention was then turned to the abdomen. Limited CT of the abdomen and pelvis was performed for planning purposes. This again demonstrated a small subhepatic fluid collection. Skin entry site was marked, and the overlying skin was prepped and draped in the standard sterile fashion. Local analgesia was obtained with 1% lidocaine. Using intermittent CT fluoroscopy, a 19 gauge Yueh catheter was advanced into the subhepatic fluid collection. Despite multiple needle repositioning, only a scant amount approximately 2 mL of thin yellow fluid was aspirated. Given lack of gross purulence, a drainage catheter was not placed. Needle was removed, and a clean dressing was placed. The patient tolerated all aspects of the procedure well without immediate complication. IMPRESSION: 1. Successful CT-guided right thoracentesis yielding  that 100 mL of clear pleural fluid. 2. Successful CT-guided aspiration/sampling of subhepatic fluid collection yielding small volume of thin yellow fluid. Findings favor loculated ascites. Sample was sent for microbiology analysis. No drain placed at this time. Electronically Signed   By: Olive Bass M.D.   On: 01/10/2023 16:50   CT ASPIRATION N/S  Result Date: 01/10/2023 INDICATION: Pleural effusion, subhepatic fluid collection EXAM: 1. CT-guided right thoracentesis 2. CT-guided aspiration of intra-abdominal subhepatic fluid collection MEDICATIONS: None. ANESTHESIA/SEDATION: Moderate (conscious) sedation was employed during this procedure. A total of Versed 2 mg and Fentanyl 50 mcg was administered  intravenously. Moderate Sedation Time: 22 minutes. The patient's level of consciousness and vital signs were monitored continuously by radiology nursing throughout the procedure under my direct supervision. FLUOROSCOPY TIME:  N/a COMPLICATIONS: None immediate. PROCEDURE: Informed written consent was obtained from the patient after a thorough discussion of the procedural risks, benefits and alternatives. All questions were addressed. Maximal Sterile Barrier Technique was utilized including caps, mask, sterile gowns, sterile gloves, sterile drape, hand hygiene and skin antiseptic. A timeout was performed prior to the initiation of the procedure. The patient was placed supine on the exam table. Limited CT of the chest was performed for planning purposes. This demonstrated a moderate right pleural effusion. Skin entry site was marked, the overlying skin was prepped and draped in the standard sterile fashion. Local analgesia was obtained with 1% lidocaine. Using intermittent CT fluoroscopy, a 19 gauge Yueh catheter was advanced into the right pleural fluid. Location was confirmed with CT. Approximately 100 mL of clear, straw-colored fluid was removed. Needle was removed, and a sterile dressing was placed. Limited postprocedure CT demonstrated no pneumothorax. Attention was then turned to the abdomen. Limited CT of the abdomen and pelvis was performed for planning purposes. This again demonstrated a small subhepatic fluid collection. Skin entry site was marked, and the overlying skin was prepped and draped in the standard sterile fashion. Local analgesia was obtained with 1% lidocaine. Using intermittent CT fluoroscopy, a 19 gauge Yueh catheter was advanced into the subhepatic fluid collection. Despite multiple needle repositioning, only a scant amount approximately 2 mL of thin yellow fluid was aspirated. Given lack of gross purulence, a drainage catheter was not placed. Needle was removed, and a clean dressing was  placed. The patient tolerated all aspects of the procedure well without immediate complication. IMPRESSION: 1. Successful CT-guided right thoracentesis yielding that 100 mL of clear pleural fluid. 2. Successful CT-guided aspiration/sampling of subhepatic fluid collection yielding small volume of thin yellow fluid. Findings favor loculated ascites. Sample was sent for microbiology analysis. No drain placed at this time. Electronically Signed   By: Olive Bass M.D.   On: 01/10/2023 16:50   IR Catheter Tube Change  Result Date: 01/08/2023 INDICATION: Persistent fluid collection status post percutaneous drainage EXAM: Drainage catheter exchange and repositioning using fluoroscopic guidance MEDICATIONS: Documented in the EMR ANESTHESIA/SEDATION: Local analgesia COMPLICATIONS: None immediate. PROCEDURE: Informed written consent was obtained from the patient after a thorough discussion of the procedural risks, benefits and alternatives. All questions were addressed. Maximal Sterile Barrier Technique was utilized including caps, mask, sterile gowns, sterile gloves, sterile drape, hand hygiene and skin antiseptic. A timeout was performed prior to the initiation of the procedure. The patient was placed supine on the exam table. The right upper quadrant was prepped and draped in the standard sterile fashion with inclusion of the existing percutaneous drainage catheter within the sterile field. Injection of the initial drainage catheter demonstrated communication  with the more lateral and persistent component. Over an Amplatz wire, the existing drainage catheter was removed. Using combination of an angled catheter and wire, access into the more lateral undrained component was obtained. Location was confirmed with injection of contrast material. Over an Amplatz wire, a new 12 French locking drainage catheter was advanced into the fluid collection. Location was again confirmed with injection of contrast material. Locking  loop was formed, and the drainage catheter was secured to the skin using silk suture and a dressing. It was attached to bulb suction. The patient tolerated the procedure well without immediate complication. IMPRESSION: Successful repositioning and exchange of the 12 French locking drainage catheter within the fluid collection near the hepatic dome. Drainage catheter placed to bulb suction. Electronically Signed   By: Olive Bass M.D.   On: 01/08/2023 11:06   CT ABDOMEN PELVIS W CONTRAST  Result Date: 01/07/2023 CLINICAL DATA:  Right lower quadrant abdominal pain EXAM: CT ABDOMEN AND PELVIS WITH CONTRAST TECHNIQUE: Multidetector CT imaging of the abdomen and pelvis was performed using the standard protocol following bolus administration of intravenous contrast. RADIATION DOSE REDUCTION: This exam was performed according to the departmental dose-optimization program which includes automated exposure control, adjustment of the mA and/or kV according to patient size and/or use of iterative reconstruction technique. CONTRAST:  OMNIPAQUE IOHEXOL 300 MG/ML  SOLN COMPARISON:  12/28/2022 FINDINGS: Lower chest: Large right pleural effusion with passive atelectasis. Borderline cardiomegaly. Mitral valve calcification. Coronary and descending thoracic aortic atherosclerotic vascular calcification. Hepatobiliary: Markedly reduced size of the anterior perihepatic abscess with a pigtail drainage catheter in place. Residual fluid density in this vicinity proximally 6.2 by 1.4 by 1.0 cm (volume = 4.5 cm^3), formerly 15.8 by 7.1 by 9.8 cm (volume = 580 cm^3) prior to drain placement. Tiny locules of gas within the collection. Along the inferior right hepatic lobe margin, a new fluid collection with enhancing margins measures 7.4 by 1.2 by 8.8 cm (volume = 41 cm^3) and is suspicious for early perihepatic abscess formation. This is shown for example on image 52 series 5. Surrounding inflammatory stranding tracking along  the right paracolic gutter region and right omentum as on image 48 series 2. Chronic relative atrophy in the lateral segment left hepatic lobe with some mild anterior hypoenhancement in the lateral segment which is chronic probably from remote scarring. The gallbladder is absent. Pancreas: Unremarkable Spleen: Unremarkable Adrenals/Urinary Tract: Stable nonobstructive right nephrolithiasis. Stable bilateral renal cysts not substantially changed over the last 10 days. No further imaging workup of these lesions is indicated. No hydronephrosis or hydroureter. The urinary bladder appears unremarkable. Stomach/Bowel: Small periampullary duodenal diverticulum. Vascular/Lymphatic: Atherosclerosis is present, including aortoiliac atherosclerotic disease. Atheromatous plaque noted at the origins of the celiac trunk and SMA without occlusion. Reproductive: Unremarkable Other: In addition to the inflammatory stranding along the right omentum and right paracolic gutter, there is asymmetric subcutaneous edema in the overlying soft tissues example on image 25 series 2, although no obvious connection between these different anatomic compartments. There is some cutaneous thickening in this region. Mild nonspecific presacral edema. Musculoskeletal: Mild lumbar spondylosis and degenerative disc disease. IMPRESSION: 1. Markedly reduced size of the anterior perihepatic abscess with a pigtail drainage catheter in place. 2. Along the inferior right hepatic lobe margin, there is a new 41 cc fluid collection with enhancing margins suspicious for early perihepatic abscess formation. Adjacent abnormal infiltrative edema tracks along the paracolic gutter and omentum, and there is overlying subcutaneous edema and cutaneous thickening along the  right abdomen. 3. Large right pleural effusion with passive atelectasis. 4. Stable nonobstructive right nephrolithiasis. Aortic Atherosclerosis (ICD10-I70.0). Electronically Signed   By: Gaylyn Rong M.D.   On: 01/07/2023 17:21    Labs:  CBC: Recent Labs    01/08/23 0440 01/09/23 0601 01/10/23 0604 01/11/23 0506  WBC 7.9 5.1 4.9 5.2  HGB 9.2* 9.3* 8.9* 8.8*  HCT 30.0* 29.3* 28.5* 29.1*  PLT 440* 379 349 347    COAGS: Recent Labs    12/29/22 0329 01/08/23 0440  INR 1.6* 1.5*  APTT  --  45*    BMP: Recent Labs    01/08/23 0440 01/09/23 0601 01/10/23 0604 01/11/23 0506  NA 138 138 138 138  K 4.0 3.7 3.6 3.9  CL 105 106 107 106  CO2 27 26 26 26   GLUCOSE 101* 90 89 88  BUN 18 13 12 11   CALCIUM 8.1* 8.0* 8.0* 8.1*  CREATININE 0.88 0.91 0.82 0.90  GFRNONAA >60 >60 >60 >60    LIVER FUNCTION TESTS: Recent Labs    12/28/22 1135 12/28/22 1233 01/07/23 1541 01/08/23 0440  BILITOT 0.6 0.4 0.4 0.5  AST 32 26 75* 56*  ALT 15 11 42 36  ALKPHOS 65 74 62 58  PROT 6.8 6.1 5.3* 5.4*  ALBUMIN 2.5* 2.9* 2.1* 2.0*    Assessment and Plan:  Hepatic abscess s/p drain placement 12/29/22 with drain repositioning 01/08/23  Patient has had minimal serosanguineous output from drain over the past several days since repositioning. WBC of 5.2 today up from 4.9 yesterday. Original fluid culture grew E. Coli. Anticipate likely drain removal tomorrow morning. CT Abdomen/Pelvis w Contrast ordered to ensure abscess has adequately resolved before removal of the drain can be performed. Continue flushing drain 3x daily with 5 mL of sterile saline, change dressing if soiled. If drain is required to remain in place, appropriate discharge instructions and orders will be placed and shared with the patient.   Electronically Signed: Kennieth Francois, PA-C 01/11/2023, 1:19 PM   I spent a total of 15 Minutes at the the patient's bedside AND on the patient's hospital floor or unit, greater than 50% of which was counseling/coordinating care for hepatic abscess.

## 2023-01-11 NOTE — Progress Notes (Signed)
PROGRESS NOTE    [name] Young.  WUJ:[id number] DOB: May 22, 1941 DOA: 01/07/2023 PCP: [name], MD  211A/211A-AA  LOS: 4 days   Brief hospital course:  [name] Young, [name] Young. is a 82 year old male with history of CAD s/p CABG, Afib, Streptococcus and E coli bacteremia in Feb 2023 due to ascending cholangitis from choledocholithiasis s/p ERCP 10/23/21, mitral valve endocarditis treated with 6 weeks of IV cefazolin, liver abscess s/p drain placement on 12/29/22 currently on ceftriaxone, recurrent right pleural effusion who presented from SNF rehab with complaints of swelling and pain and rash over right side of his abdomen and right flank.    Assessment & Plan:  Liver abscesses --during last hospitalization from 12/28/22 to 01/05/23, pt was found to have an enlarging 15 x 18 to 9.8 cm complex fluid collection along the ventral margin of the liver and had a drain placed on 12/29/2022 with 500 cc of purulent fluid removed.  Liver abscess culture had E. coli.  Liver abscess is secondary to the choledocholithiasis and ascending cholangitis from back in 2023.  Pt was recently discharged on 01/05/23 with drain to liver abscess and 4 weeks of ceftriaxone. --current CT a/p showed markedly reduced size of known abscess, and a new 41 cc fluid collection suspicious for early perihepatic abscess formation. --IR repositioned existing drain and performed CT-guided aspiration of the new abscess (too small for a drain).  Abscess cx sent Plan: --Continue Zosyn --Follow cultures --ID consulted, appreciate recommendations --IR saw today, plan to monitor drain output one more day, potential drain removal tomorrow  Recurrent right pleural effusion --presumed due to liver abscess --s/p thoracentesis with 900 ml remoived  Chronic diastolic CHF (congestive heart failure) (HCC) --home Entresto and lasix due to soft BP   Bacteremia due to Escherichia coli, ruled out   Paroxysmal atrial fibrillation  (HCC) --resume home Eliquis   Anemia, chronic   Coronary artery disease Stable. pt has no chest pain.  --cont statin   CKD stage 3, ruled out --GFR >60   GERD (gastroesophageal reflux disease) --cont home PPI   Type 2 diabetes mellitus  --d/c'ed BG checks and SSI  Skin excoriations  - zinc oxide barrier cream PRN  Decubitus ulcer of right buttock, stage 2  I agree with the wound description as outlined --Continue diligent wound care --Offload area  --Frequent repositioning --Monitor for signs of infection --Appreciate Wound Care RN's recommendations (01/04/23) Pressure Injury 01/08/23 Buttocks Right;Mid Stage 2 -  Partial thickness loss of dermis presenting as a shallow open injury with a red, pink wound bed without slough. pink, very shallow, macerated skin (Active)  01/08/23 1920  Location: Buttocks  Location Orientation: Right;Mid  Staging: Stage 2 -  Partial thickness loss of dermis presenting as a shallow open injury with a red, pink wound bed without slough.  Wound Description (Comments): pink, very shallow, macerated skin  Present on Admission: Yes   --Diflucan x2 on admission    DVT prophylaxis: Lovenox SQ Code Status: Full code  Family Communication: wife and daughter updated at bedside today Level of care: Med-Surg Dispo:   The patient is from: SNF rehab Anticipated d/c is to: SNF rehab Anticipated d/c date is: 1-2 days   Subjective and Interval History:  Pt seen with wife at bedside.  Reports not feeling as well today as yesterday.  He is considering going home with HH instead of back to rehab. Feels walking has improved.  Bottom remains very sore, very uncomfortable.  Pain at drain site.  Hopes to get to d/c tomorrow.   Objective: Vitals:   01/11/23 0429 01/11/23 0518 01/11/23 0523 01/11/23 0902  BP: (!) 84/51 (!) 103/56  130/65  Pulse: 74 72  77  Resp: 20   18  Temp: 98 F (36.7 C)   98.1 F (36.7 C)  TempSrc: Oral     SpO2: 97%   94%   Weight:   99.8 kg   Height:        Intake/Output Summary (Last 24 hours) at 01/11/2023 1351 Last data filed at 01/11/2023 1005 Gross per 24 hour  Intake 483 ml  Output 432 ml  Net 51 ml   Filed Weights   01/08/23 1926 01/10/23 0500 01/11/23 0523  Weight: 98.8 kg 99.5 kg 99.8 kg    Examination:   General exam: awake, appears more drowsy today, no acute distress HEENT: moist mucus membranes, hearing grossly normal  Respiratory system: on room air, normal respiratory effort. Cardiovascular system: RRR, no JVD, murmurs, rubs, gallops,  no pedal edema.   Gastrointestinal system: JP drain in RUQ with serosanguinous fluid, abdomen is soft, NT Central nervous system: A&O x 4. no gross focal neurologic deficits, normal speech Extremities: moves all, no edema, normal tone Skin: dry, intact, normal temperature Psychiatry: normal mood, congruent affect, judgement and insight appear normal    Data Reviewed: I have personally reviewed labs and imaging studies  Notable labs --- BMP normal except Ca 8.1.  Hbg stable 8.8  Micro --  Blood cultures 4/30 - negative to date, pending  Abscess cultures 4/29 -- negative to date, pending   Time spent: 42 minutes  [name], DO [facility] If 7PM-7AM, please contact night-coverage 01/11/2023, 1:51 PM

## 2023-01-12 ENCOUNTER — Other Ambulatory Visit: Payer: Self-pay | Admitting: *Deleted

## 2023-01-12 DIAGNOSIS — I5032 Chronic diastolic (congestive) heart failure: Secondary | ICD-10-CM | POA: Diagnosis not present

## 2023-01-12 DIAGNOSIS — E1169 Type 2 diabetes mellitus with other specified complication: Secondary | ICD-10-CM | POA: Diagnosis not present

## 2023-01-12 DIAGNOSIS — B9620 Unspecified Escherichia coli [E. coli] as the cause of diseases classified elsewhere: Secondary | ICD-10-CM

## 2023-01-12 DIAGNOSIS — K750 Abscess of liver: Secondary | ICD-10-CM | POA: Diagnosis not present

## 2023-01-12 DIAGNOSIS — L89312 Pressure ulcer of right buttock, stage 2: Secondary | ICD-10-CM | POA: Diagnosis not present

## 2023-01-12 LAB — BASIC METABOLIC PANEL
Anion gap: 6 (ref 5–15)
BUN: 10 mg/dL (ref 8–23)
CO2: 26 mmol/L (ref 22–32)
Calcium: 8.4 mg/dL — ABNORMAL LOW (ref 8.9–10.3)
Chloride: 108 mmol/L (ref 98–111)
Creatinine, Ser: 0.83 mg/dL (ref 0.61–1.24)
GFR, Estimated: >60 mL/min (ref >60)
Glucose, Bld: 89 mg/dL (ref 70–99)
Potassium: 3.8 mmol/L (ref 3.5–5.1)
Sodium: 140 mmol/L (ref 135–145)

## 2023-01-12 LAB — CBC
HCT: 29.3 % — ABNORMAL LOW (ref 39.0–52.0)
Hemoglobin: 9 g/dL — ABNORMAL LOW (ref 13.0–17.0)
MCH: 27.1 pg (ref 26.0–34.0)
MCHC: 30.7 g/dL (ref 30.0–36.0)
MCV: 88.3 fL (ref 80.0–100.0)
Platelets: 359 10*3/uL (ref 150–400)
RBC: 3.32 MIL/uL — ABNORMAL LOW (ref 4.22–5.81)
RDW: 16 % — ABNORMAL HIGH (ref 11.5–15.5)
WBC: 4.8 10*3/uL (ref 4.0–10.5)
nRBC: 0 % (ref 0.0–0.2)

## 2023-01-12 LAB — CULTURE, BLOOD (ROUTINE X 2): Culture: NO GROWTH

## 2023-01-12 MED ORDER — CEFTRIAXONE IV (FOR PTA / DISCHARGE USE ONLY): 2.0000 g | INTRAVENOUS | 0 refills | Status: AC

## 2023-01-12 MED ORDER — METRONIDAZOLE 500 MG PO TABS: 500.0000 mg | ORAL_TABLET | Freq: Two times a day (BID) | ORAL | 0 refills | Status: AC

## 2023-01-12 MED ORDER — ZINC OXIDE 40 % EX OINT: TOPICAL_OINTMENT | Freq: Three times a day (TID) | CUTANEOUS | 0 refills | Status: DC

## 2023-01-12 MED ORDER — OXYCODONE-ACETAMINOPHEN 5-325 MG PO TABS: 1.0000 | ORAL_TABLET | Freq: Four times a day (QID) | ORAL | 0 refills | Status: DC | PRN

## 2023-01-12 MED ORDER — SODIUM CHLORIDE 0.9% FLUSH: 3.0000 mL | Freq: Two times a day (BID) | INTRAVENOUS | 0 refills | Status: DC

## 2023-01-12 NOTE — Care Management Important Message (Signed)
Important Message  Patient Details  Name: [name] Young. MRN: [id number] Date of Birth: [birth year]   Medicare Important Message Given:  Yes     [name] 01/12/2023, 12:08 PM

## 2023-01-12 NOTE — Progress Notes (Signed)
PHARMACY CONSULT NOTE FOR:  OUTPATIENT  PARENTERAL ANTIBIOTIC THERAPY (OPAT)  Indication: E coli liver abscess Regimen: Ceftriaxone 2gm IV q24h  - oral antibiotic therapy = Metronidazole 500mg  PO BID End date: 02/05/2023  Weekly labs on Mondays: CBC with diff, CMP Please pull PIC at completion of IV antibiotics Fax weekly lab results  promptly to [phone]  IV antibiotic discharge orders are pended. To discharging provider:  please sign these orders via discharge navigator,  Select New Orders & click on the button choice - Manage This Unsigned Work.     Thank you for allowing pharmacy to be a part of this patient's care.  Juliette Alcide, PharmD, BCPS, BCIDP Work Cell: [phone] 01/12/2023 11:49 AM

## 2023-01-12 NOTE — Plan of Care (Addendum)
ID Brief note   CT abdomen pelvis 5/2 reviewed prior to switch of IV zosyn to ceftriaxone   Pigtail catheter again seen along the margin of the fluid collection in the space anterior to the liver with soft tissue thickening and stranding. This is larger than the recent procedure study of 01/08/2023 but the previous high density is no longer identified. Please correlate with level of drainage from the catheter.   Second fluid collection along the caudal margin of the liver laterally extending in the pericolic gutter is multiloculated but smaller.   No new rim enhancing fluid collections. Diffuse mesenteric stranding anasarca.  Per IR patient will be discharged with a drainage catheter  4/29 cx NG in 4 days. Less likely to be a new infection esp PsA and will continue ceftriaxone and metronidazole as is   Diagnosis: hepatic abscess  Culture Result: E coli   No Known Allergies  OPAT Orders Discharge antibiotics to be given via PICC line Discharge antibiotics:ceftriaxone 2 g iv daily and metronidazole 500mg  po bid  Per pharmacy protocol  Aim for Vancomycin trough 15-20 or AUC 400-550 (unless otherwise indicated) Duration: 4 weeks  End Date: 5/27  [facility] Care Per Protocol:  Home health RN for IV administration and teaching; PICC line care and labs.    Labs weekly while on IV antibiotics: X__ CBC with differential __ BMP X__ CMP X__ CRP X__ ESR __ Vancomycin trough __ CK  X__ Please pull PIC at completion of IV antibiotics __ Please leave PIC in place until doctor has seen patient or been notified  Fax weekly labs to [phone]  Clinic Follow Up Appt: 5/16 at 10:30 am   Odette Fraction, MD Infectious Disease Physician [facility] [address] [place], [place] [address] Phone: [phone]  Fax: [phone]

## 2023-01-12 NOTE — Progress Notes (Signed)
Physical Therapy Treatment Patient Details Name: [name] Young. MRN: [id number] DOB: Apr 15, 1941 Today's Date: 01/12/2023   History of Present Illness Kushal Magoon. is a 82 y.o. male recently diagnosed hepatic abscess who comes in with worsening right-sided swelling, right-sided pain. PMH includes: BPH, CKD III, CAD, HLD, HTN, AFIB, & T2DM.    PT Comments    Pt received upright in bed agreeable to PT. Pt confirms wishes to go home with [facility] services. Focus of session on stair assessment. Use of SPC throughout mobility mainly requiring supervision.  Pt with inconsistent use of SPC with 3 point gait pattern despite VC's to correct. Pt states this is close to his baseline use of SPC in home setting. No LOB or gait abnormality despite incorrect SPC use. Pt desc steps at [facility] for safety and asc with supervision with safe completion. Pt returns to supine in bed with minA at LE's with increased WOB and fatigue. Reviewed use of SPC versus RW for reduced falls risk and sleeping in recliner to reduce care giver burden. Pt and spouse understanding. D/c recs updated for [facility] services. No DME needs.    Recommendations for follow up therapy are one component of a multi-disciplinary discharge planning process, led by the attending physician.  Recommendations may be updated based on patient status, additional functional criteria and insurance authorization.     Assistance Recommended at Discharge Frequent or constant Supervision/Assistance  Patient can return home with the following Assistance with cooking/housework;Assist for transportation;Help with stairs or ramp for entrance;A little help with walking and/or transfers;A little help with bathing/dressing/bathroom   Equipment Recommendations  None recommended by PT    Recommendations for Other Services       Precautions / Restrictions Precautions Precautions: Fall Precaution Comments: RUQ abdominal/perihepatic JP drain Restrictions Weight  Bearing Restrictions: No     Mobility  Bed Mobility Overal bed mobility: Needs Assistance Bed Mobility: Supine to Sit, Sit to Supine     Supine to sit: Supervision, HOB elevated Sit to supine: Min assist   General bed mobility comments: minA at LE's Patient Response: Cooperative  Transfers Overall transfer level: Needs assistance Equipment used: Straight cane Transfers: Sit to/from Stand Sit to Stand: Min guard                Ambulation/Gait Ambulation/Gait assistance: Supervision Gait Distance (Feet): 108 Feet Assistive device: Straight cane Gait Pattern/deviations: Step-to pattern, Trunk flexed       General Gait Details: Completing gait with SPC today with sporadic 3 point pattern. Reports baseline use but is generally incorrect sequencing despite VC's to correct. No LOB despite varied gait pattern with SPC.   Stairs Stairs: Yes Stairs assistance: Min guard, Supervision Stair Management: One rail Right, Step to pattern, Forwards, With cane Number of Stairs: 5 General stair comments: asc/desc with railing and SPC safely. Minguard with descent and supervision for ascent.   Wheelchair Mobility    Modified Rankin (Stroke Patients Only)       Balance Overall balance assessment: Needs assistance Sitting-balance support: No upper extremity supported, Feet supported Sitting balance-Leahy Scale: Good Sitting balance - Comments: able to don gym shorts sitting EOB   Standing balance support: No upper extremity supported, During functional activity Standing balance-Leahy Scale: Fair Standing balance comment: can stand without UE's on supportive surface                            Cognition Arousal/Alertness: Awake/alert Behavior During  Therapy: WFL for tasks assessed/performed Overall Cognitive Status: Within Functional Limits for tasks assessed                                          Exercises Other Exercises Other  Exercises: Reviewed SPC versus RW in household to prevent falls.    General Comments        Pertinent Vitals/Pain Pain Assessment Pain Assessment: Faces Faces Pain Scale: Hurts a little bit Pain Location: buttocks, drain site. Pain Descriptors / Indicators: Sore, Guarding, Grimacing Pain Intervention(s): Limited activity within patient's tolerance, Monitored during session, Repositioned    Home Living                          Prior Function            PT Goals (current goals can now be found in the care plan section) Acute Rehab PT Goals Patient Stated Goal: improve his pain and go home PT Goal Formulation: With patient Time For Goal Achievement: 01/23/23 Potential to Achieve Goals: Good Progress towards PT goals: Progressing toward goals    Frequency    Min 4X/week      PT Plan Discharge plan needs to be updated    Co-evaluation              AM-PAC PT "6 Clicks" Mobility   Outcome Measure  Help needed turning from your back to your side while in a flat bed without using bedrails?: A Lot Help needed moving from lying on your back to sitting on the side of a flat bed without using bedrails?: A Lot Help needed moving to and from a bed to a chair (including a wheelchair)?: A Little Help needed standing up from a chair using your arms (e.g., wheelchair or bedside chair)?: A Little Help needed to walk in hospital room?: A Little Help needed climbing 3-5 steps with a railing? : A Little 6 Click Score: 16    End of Session Equipment Utilized During Treatment: Gait belt Activity Tolerance: Patient tolerated treatment well Patient left: in bed;with call bell/phone within reach;with bed alarm set;with family/visitor present Nurse Communication: Mobility status PT Visit Diagnosis: Muscle weakness (generalized) (M62.81);Difficulty in walking, not elsewhere classified (R26.2)     Time: 4098-1191 PT Time Calculation (min) (ACUTE ONLY): 15  min  Charges:  $Gait Training: 8-22 mins                    Delphia Grates. Fairly IV, PT, DPT Physical Therapist- [place]  [facility]  01/12/2023, 11:56 AM

## 2023-01-12 NOTE — Consult Note (Signed)
   [facility] CM Inpatient Consult   01/12/2023  [name] Young. [birth year] [id number]  Update: Pt will not go to SNF however will have [facility] services. [facility] hospital liaison spoke with the pt's spouse [name] Young and verified pt is now home with [facility] and [facility]. Spouse educated on [facility] services and agreed to a post hospital follow up cal to pt for hospital prevention readmission measures. Will refer for [facility] RN care coordinator for post follow call and services.  [facility] services does not interfere with TOC arrangements for discharge services arranged.  [name] Cousin, RN, BSN [facility] Liaison [place]   [facility]  Population Health Office Hours MTWF 8:00 am to 6 pm off on Thursday [phone] mobile [phone] [Office toll free line][facility] Office Hours are M-F 8:30 - 5 pm 24 hour nurse advise line [phone] Conceirge  [name]

## 2023-01-12 NOTE — Discharge Instructions (Signed)
Please flush drain once daily and record output daily. Change dressing every 2-3 days or earlier if soiled or wet. Patient may shower if drain area is covered with a waterproof covering. Do not submerge drain area in water due to infection risk.

## 2023-01-12 NOTE — TOC Progression Note (Signed)
Transition of Care (TOC) - Progression Note    Patient Details  Name: [name] Young. MRN: [id number] Date of Birth: 07-10-1941  Transition of Care [facility]) CM/SW Contact  Margarito Liner, LCSW Phone Number: 01/12/2023, 11:58 AM  Clinical Narrative:  Patient and wife confirmed preference to return home. [facility] and [employer] are aware. [employer] liaison will come this afternoon for education with patient and wife.   Expected Discharge Plan and Services                                               Social Determinants of Health (SDOH) Interventions SDOH Screenings   Food Insecurity: Patient Declined (12/26/2022)  Housing: Low Risk  (12/26/2022)  Transportation Needs: No Transportation Needs (12/26/2022)  Depression (PHQ2-9): Medium Risk (12/26/2022)  Financial Resource Strain: Low Risk  (12/26/2022)  Physical Activity: Unknown (12/26/2022)  Social Connections: Unknown (12/26/2022)  Stress: No Stress Concern Present (12/26/2022)  Tobacco Use: Medium Risk (01/08/2023)    Readmission Risk Interventions     No data to display

## 2023-01-12 NOTE — Progress Notes (Cosign Needed Addendum)
Referring Physician(s): * No referring provider recorded for this case *  Supervising Physician: Pernell Dupre  Patient Status:  [facility] - In-pt  Chief Complaint:  Hepatic abscess s/p drain placement 12/29/22 with additional aspiration and drain repositioning 01/08/23  Subjective:  Patient alert and sitting comfortably in bed at time of exam. He is accompanied by his wife today. They report that patient is pending discharge to home.  Allergies: Patient has no known allergies.  Medications: Prior to Admission medications   Medication Sig Start Date End Date Taking? Authorizing Provider  acetaminophen (TYLENOL) 325 MG tablet Take 1 tablet (325 mg total) by mouth at bedtime as needed (sleep aid). 01/05/23  Yes Wieting, Richard, MD  apixaban (ELIQUIS) 5 MG TABS tablet TAKE 1 TABLET BY MOUTH TWICE A DAY 08/23/22  Yes End, [name], MD  cefTRIAXone (ROCEPHIN) IVPB Inject 2 g into the vein daily for 19 days. Indication:  E coli liver abscess Last Day of Therapy:  01/25/2023 Labs - Once weekly on Mondays:  CBC/D and CMP Please pull PIC at completion of IV antibiotics Fax weekly lab results  promptly to [phone] Method of administration: IV Push Method of administration may be changed at the discretion of facility/pharmacy 01/06/23 01/25/23 Yes Wieting, Richard, MD  Cholecalciferol (VITAMIN D3) 25 MCG (1000 UT) CAPS Take 1 capsule (1,000 Units total) by mouth daily. 05/24/20  Yes [name], MD  Cyanocobalamin (B-12) 1000 MCG CAPS Take by mouth daily at 2 am.   Yes [provider]  diphenhydrAMINE (BENADRYL) 25 mg capsule Take 1 capsule (25 mg total) by mouth at bedtime as needed for sleep. 01/05/23  Yes Wieting, Richard, MD  FARXIGA 5 MG TABS tablet Take 5 mg by mouth daily. 10/27/22  Yes [provider]  feeding supplement (ENSURE ENLIVE / ENSURE PLUS) LIQD Take 237 mLs by mouth 2 (two) times daily between meals. 01/05/23  Yes Wieting, Richard, MD   mirtazapine (REMERON) 7.5 MG tablet Take 1 tablet (7.5 mg total) by mouth at bedtime. 01/05/23  Yes Wieting, Richard, MD  Multiple Vitamins-Minerals (MACULAR VITAMIN BENEFIT PO) Take 1 tablet by mouth daily.   Yes [provider]  nitroGLYCERIN (NITROSTAT) 0.4 MG SL tablet Place 1 tablet (0.4 mg total) under the tongue every 5 (five) minutes as needed for chest pain. 03/26/20  Yes End, [name], MD  oxyCODONE-acetaminophen (PERCOCET/ROXICET) 5-325 MG tablet Take 1 tablet by mouth every 6 (six) hours as needed for moderate pain. 01/05/23  Yes Wieting, Richard, MD  pantoprazole (PROTONIX) 40 MG tablet Take 1 tablet (40 mg total) by mouth daily. 01/06/23  Yes Wieting, Richard, MD  rosuvastatin (CRESTOR) 10 MG tablet TAKE 1 TABLET BY MOUTH EVERY DAY 06/15/22  Yes End, [name], MD  sacubitril-valsartan (ENTRESTO) 24-26 MG Take 1 tablet by mouth 2 (two) times daily. Hold for SBP less than 100 01/05/23  Yes Wieting, Richard, MD  senna-docusate (SENOKOT-S) 8.6-50 MG tablet Take 1 tablet by mouth at bedtime as needed for mild constipation. 01/05/23  Yes Wieting, Richard, MD  sodium chloride flush (NS) 0.9 % SOLN 5 mLs by Intracatheter route every 8 (eight) hours. 01/05/23  Yes Wieting, Richard, MD  tamsulosin (FLOMAX) 0.4 MG CAPS capsule TAKE 1 CAPSULE BY MOUTH EVERY DAY 09/18/22  Yes [name], MD  furosemide (LASIX) 40 MG tablet Take 1 tablet (40 mg total) by mouth daily as needed (Shortness of breath, or weight gain of 3 pounds in a day or 5 pounds in a week.). Patient  not taking: Reported on 01/07/2023 01/05/23   [name], MD     Vital Signs: BP 138/75 (BP Location: Right Arm)   Pulse 67   Temp 98.3 F (36.8 C)   Resp 17   Ht 5\' 10"  (1.778 m)   Wt 220 lb 0.3 oz (99.8 kg)   SpO2 95%   BMI 31.57 kg/m   Physical Exam Vitals reviewed.  Constitutional:      General: He is not in acute distress.    Appearance: He is ill-appearing.  Cardiovascular:     Pulses: Normal  pulses.  Pulmonary:     Effort: Pulmonary effort is normal.  Abdominal:     Tenderness: There is no abdominal tenderness.     Comments: RUQ drain in place. Scant serosanguineous fluid in JP bulb at time of exam. Suture intact. Site is appropriately dressed and dressing is clean, dry, and intact. No sign of bleeding, induration, erythema, or drainage from drain insertion site. Drain flushes easily but aspirates with some effort.   Skin:    General: Skin is warm and dry.  Neurological:     Mental Status: He is alert and oriented to person, place, and time.  Psychiatric:        Mood and Affect: Mood normal.        Behavior: Behavior normal.     Imaging: CT ABDOMEN PELVIS W CONTRAST  Result Date: 01/11/2023 CLINICAL DATA:  Status post hepatic abscess with drain placement. EXAM: CT ABDOMEN AND PELVIS WITH CONTRAST TECHNIQUE: Multidetector CT imaging of the abdomen and pelvis was performed using the standard protocol following bolus administration of intravenous contrast. RADIATION DOSE REDUCTION: This exam was performed according to the departmental dose-optimization program which includes automated exposure control, adjustment of the mA and/or kV according to patient size and/or use of iterative reconstruction technique. CONTRAST:  OMNIPAQUE IOHEXOL 300 MG/ML  SOLN COMPARISON:  Diagnostic CT 01/07/2023 and older. CT procedure 01/08/2023 and older FINDINGS: Lower chest: Status post median sternotomy. Calcifications along the mitral valve annulus. Calcifications along the aortic valve. Coronary artery calcifications are seen. Small hiatal hernia. There is a small right pleural effusion, similar to the recent prior. Adjacent opacity. Trace left-sided pleural fluid with some adjacent atelectasis. Hepatobiliary: Patent portal vein. Previous cholecystectomy. There is some low attenuation seen in the liver at the dome involving segment 4/8. Nonspecific. Possibly edema with the adjacent fluid collection  or other process. Adjacent fluid collections are again identified. There is pigtail catheter within an area of fluid medially anterior and caudal to this location. The collection in which the pigtail is along the margin of measures AP dimension of 3.6 cm, cephalocaudal 2.3 cm and transverse 6.0 cm. This does have some luminal air. The size of this has increased from the prior CT scan. Previously there was some high attenuation material within this collection on the procedure study of 01/08/2023. The high density is no longer seen. Please correlate with level of drainage from the pigtail. There is smaller fluid collection along the inferior margin of the right hepatic lobe laterally extending in the right pericolic gutter with loculations. This is smaller than previous examination. On coronal series 5, image 407 this collection would measure 4.1 by 2.0 Cm and previously 6.8 by 2.9 cm. No additional perihepatic well-defined fluid collections. Pancreas: Moderate atrophy of the pancreas.  No obvious mass. Spleen: Normal in size without focal abnormality. Adrenals/Urinary Tract: Adrenal glands are preserved. No enhancing renal mass. Nonobstructing 4 mm right-sided renal stone.  Separate punctate lower pole stone as well. Bilateral renal cysts are once again identified, Bosniak 1 lesions. No specific imaging follow-up. These are unchanged from previous. The ureters have normal course and caliber down to the bladder. Preserved contours of the urinary bladder. Stomach/Bowel: Oral contrast was administered. The large bowel has a normal course and caliber with left-sided colonic diverticulosis. Normal appendix seen retrocecal in the right hemipelvis. Stomach is underdistended. Third portion duodenal diverticula. Small bowel is nondilated. There are some small bowel diverticula seen as well. Vascular/Lymphatic: Normal caliber aorta and IVC with scattered vascular calcifications. Prominent but not pathologically enlarged  retroperitoneal nodes identified in the upper abdomen including portacaval and periceliac. These are unchanged from previous. Reproductive: Prostate is unremarkable. Other: Small fat containing inguinal hernias. Anasarca. Diffuse mesenteric haziness. No free air. Persistent presacral fat stranding, nonspecific. Musculoskeletal: Scattered degenerative changes are seen along the spine. Degenerative changes of the pelvis. IMPRESSION: Pigtail catheter again seen along the margin of the fluid collection in the space anterior to the liver with soft tissue thickening and stranding. This is larger than the recent procedure study of 01/08/2023 but the previous high density is no longer identified. Please correlate with level of drainage from the catheter. Second fluid collection along the caudal margin of the liver laterally extending in the pericolic gutter is multiloculated but smaller. No new rim enhancing fluid collections. Diffuse mesenteric stranding anasarca. No bowel obstruction. Scattered large and small bowel diverticula. Small hiatal hernia. Stable small right and tiny left pleural effusion with adjacent opacity. Postop chest.  Calcifications in the area of the aortic valve. Nonobstructing right-sided renal stones. Electronically Signed   By: Karen Kays M.D.   On: 01/11/2023 18:14   CT ASPIRATION N/S  Result Date: 01/10/2023 INDICATION: Pleural effusion, subhepatic fluid collection EXAM: 1. CT-guided right thoracentesis 2. CT-guided aspiration of intra-abdominal subhepatic fluid collection MEDICATIONS: None. ANESTHESIA/SEDATION: Moderate (conscious) sedation was employed during this procedure. A total of Versed 2 mg and Fentanyl 50 mcg was administered intravenously. Moderate Sedation Time: 22 minutes. The patient's level of consciousness and vital signs were monitored continuously by radiology nursing throughout the procedure under my direct supervision. FLUOROSCOPY TIME:  N/a COMPLICATIONS: None immediate.  PROCEDURE: Informed written consent was obtained from the patient after a thorough discussion of the procedural risks, benefits and alternatives. All questions were addressed. Maximal Sterile Barrier Technique was utilized including caps, mask, sterile gowns, sterile gloves, sterile drape, hand hygiene and skin antiseptic. A timeout was performed prior to the initiation of the procedure. The patient was placed supine on the exam table. Limited CT of the chest was performed for planning purposes. This demonstrated a moderate right pleural effusion. Skin entry site was marked, the overlying skin was prepped and draped in the standard sterile fashion. Local analgesia was obtained with 1% lidocaine. Using intermittent CT fluoroscopy, a 19 gauge Yueh catheter was advanced into the right pleural fluid. Location was confirmed with CT. Approximately 100 mL of clear, straw-colored fluid was removed. Needle was removed, and a sterile dressing was placed. Limited postprocedure CT demonstrated no pneumothorax. Attention was then turned to the abdomen. Limited CT of the abdomen and pelvis was performed for planning purposes. This again demonstrated a small subhepatic fluid collection. Skin entry site was marked, and the overlying skin was prepped and draped in the standard sterile fashion. Local analgesia was obtained with 1% lidocaine. Using intermittent CT fluoroscopy, a 19 gauge Yueh catheter was advanced into the subhepatic fluid collection. Despite multiple needle repositioning,  only a scant amount approximately 2 mL of thin yellow fluid was aspirated. Given lack of gross purulence, a drainage catheter was not placed. Needle was removed, and a clean dressing was placed. The patient tolerated all aspects of the procedure well without immediate complication. IMPRESSION: 1. Successful CT-guided right thoracentesis yielding that 100 mL of clear pleural fluid. 2. Successful CT-guided aspiration/sampling of subhepatic fluid  collection yielding small volume of thin yellow fluid. Findings favor loculated ascites. Sample was sent for microbiology analysis. No drain placed at this time. Electronically Signed   By: Olive Bass M.D.   On: 01/10/2023 16:50   CT ASPIRATION N/S  Result Date: 01/10/2023 INDICATION: Pleural effusion, subhepatic fluid collection EXAM: 1. CT-guided right thoracentesis 2. CT-guided aspiration of intra-abdominal subhepatic fluid collection MEDICATIONS: None. ANESTHESIA/SEDATION: Moderate (conscious) sedation was employed during this procedure. A total of Versed 2 mg and Fentanyl 50 mcg was administered intravenously. Moderate Sedation Time: 22 minutes. The patient's level of consciousness and vital signs were monitored continuously by radiology nursing throughout the procedure under my direct supervision. FLUOROSCOPY TIME:  N/a COMPLICATIONS: None immediate. PROCEDURE: Informed written consent was obtained from the patient after a thorough discussion of the procedural risks, benefits and alternatives. All questions were addressed. Maximal Sterile Barrier Technique was utilized including caps, mask, sterile gowns, sterile gloves, sterile drape, hand hygiene and skin antiseptic. A timeout was performed prior to the initiation of the procedure. The patient was placed supine on the exam table. Limited CT of the chest was performed for planning purposes. This demonstrated a moderate right pleural effusion. Skin entry site was marked, the overlying skin was prepped and draped in the standard sterile fashion. Local analgesia was obtained with 1% lidocaine. Using intermittent CT fluoroscopy, a 19 gauge Yueh catheter was advanced into the right pleural fluid. Location was confirmed with CT. Approximately 100 mL of clear, straw-colored fluid was removed. Needle was removed, and a sterile dressing was placed. Limited postprocedure CT demonstrated no pneumothorax. Attention was then turned to the abdomen. Limited CT of the  abdomen and pelvis was performed for planning purposes. This again demonstrated a small subhepatic fluid collection. Skin entry site was marked, and the overlying skin was prepped and draped in the standard sterile fashion. Local analgesia was obtained with 1% lidocaine. Using intermittent CT fluoroscopy, a 19 gauge Yueh catheter was advanced into the subhepatic fluid collection. Despite multiple needle repositioning, only a scant amount approximately 2 mL of thin yellow fluid was aspirated. Given lack of gross purulence, a drainage catheter was not placed. Needle was removed, and a clean dressing was placed. The patient tolerated all aspects of the procedure well without immediate complication. IMPRESSION: 1. Successful CT-guided right thoracentesis yielding that 100 mL of clear pleural fluid. 2. Successful CT-guided aspiration/sampling of subhepatic fluid collection yielding small volume of thin yellow fluid. Findings favor loculated ascites. Sample was sent for microbiology analysis. No drain placed at this time. Electronically Signed   By: Olive Bass M.D.   On: 01/10/2023 16:50    Labs:  CBC: Recent Labs    01/09/23 0601 01/10/23 0604 01/11/23 0506 01/12/23 0518  WBC 5.1 4.9 5.2 4.8  HGB 9.3* 8.9* 8.8* 9.0*  HCT 29.3* 28.5* 29.1* 29.3*  PLT 379 349 347 359     COAGS: Recent Labs    12/29/22 0329 01/08/23 0440  INR 1.6* 1.5*  APTT  --  45*     BMP: Recent Labs    01/09/23 0601 01/10/23 0604 01/11/23 1610  01/12/23 0518  NA 138 138 138 140  K 3.7 3.6 3.9 3.8  CL 106 107 106 108  CO2 26 26 26 26   GLUCOSE 90 89 88 89  BUN 13 12 11 10   CALCIUM 8.0* 8.0* 8.1* 8.4*  CREATININE 0.91 0.82 0.90 0.83  GFRNONAA >60 >60 >60 >60     LIVER FUNCTION TESTS: Recent Labs    12/28/22 1135 12/28/22 1233 01/07/23 1541 01/08/23 0440  BILITOT 0.6 0.4 0.4 0.5  AST 32 26 75* 56*  ALT 15 11 42 36  ALKPHOS 65 74 62 58  PROT 6.8 6.1 5.3* 5.4*  ALBUMIN 2.5* 2.9* 2.1* 2.0*      Assessment and Plan:  Hepatic abscess s/p drain placement 12/29/22 with drain repositioning 01/08/23  Patient has had minimal serosanguineous output from drain over the past several days since repositioning. WBC of 4.8 today, down from 5.2 yesterday.  -Original fluid culture grew E. Coli. Pt being discharged on IV ceftriaxone 2 g q24hrs, Metronidazole 500mg  PO BID -CT yesterday showed persistence of hepatic abscess. Drain to remain in place. Discharge orders placed for follow-up in 1-week with IR outpatient clinic. Patient and wife educated on how to flush drain with 5cc of sterile saline and empty drain daily and to record daily output. 1-week supply of saline flushes provided to patient. Electronically Signed: Kennieth Francois, PA-C 01/12/2023, 12:52 PM   I spent a total of 15 Minutes at the the patient's bedside AND on the patient's hospital floor or unit, greater than 50% of which was counseling/coordinating care for hepatic abscess.

## 2023-01-12 NOTE — Discharge Summary (Addendum)
Physician Discharge Summary   Patient: [name] Young. MRN: [id number] DOB: 21-Sep-1940  Admit date:     01/07/2023  Discharge date: 01/12/23  Discharge Physician: [name]   PCP: [name], MD   Recommendations at discharge:   Follow up next week with Interventional Radiology, as scheduled Monitor drain output.  Flush drain twice daily. Follow up with Primary Care in 1 week Weekly labs -- CBC w diff, CMP -- while on antibiotics.  Fax results to [phone] Follow up with Cardiology as scheduled Monitor BP & resume Sherryll Burger, Farxiga and Lasix when BP will tolerate (these are held to avoid hypotension currently)   Discharge Diagnoses: Principal Problem:   Abdominal pain Active Problems:   Hepatic abscess   Type 2 diabetes mellitus with other specified complication (HCC)   GERD (gastroesophageal reflux disease)   CKD stage 3 due to type 2 diabetes mellitus (HCC)   Advanced care planning/counseling discussion   Coronary artery disease   Anemia   Paroxysmal atrial fibrillation (HCC)   Recurrent right pleural effusion   Chronic diastolic CHF (congestive heart failure) (HCC)   Decubitus ulcer of right buttock, stage 2 (HCC)   Medication management  Resolved Problems:   Bacteremia due to Escherichia coli  Hospital Course:  [name], Young. is a 82 year old male with history of CAD s/p CABG, Afib, Streptococcus and E coli bacteremia in Feb 2023 due to ascending cholangitis from choledocholithiasis s/p ERCP 10/23/21, mitral valve endocarditis treated with 6 weeks of IV cefazolin, liver abscess s/p drain placement on 12/29/22 currently on ceftriaxone, recurrent right pleural effusion who presented from SNF rehab with complaints of swelling and pain and rash over right side of his abdomen and right flank.   Further hospital course and management as outlined below.  01/12/23: pt doing well overall.  Radiology recommends to continue drain and follow up in 1 week  after review of yesterday's CT scan.  ID has finalized antibiotic recommendations for 4 week course. Pt stable for discharge home with Home Health PT, OT, RN for home IV antibiotics.   Assessment and Plan:  Liver abscesses --during last hospitalization from 12/28/22 to 01/05/23, pt was found to have an enlarging 15 x 18 to 9.8 cm complex fluid collection along the ventral margin of the liver and had a drain placed on 12/29/2022 with 500 cc of purulent fluid removed.  Liver abscess culture had E. coli.  Liver abscess is secondary to the choledocholithiasis and ascending cholangitis from back in 2023.  Pt was recently discharged on 01/05/23 with drain to liver abscess and 4 weeks of ceftriaxone. --current CT a/p showed markedly reduced size of known abscess, and a new 41 cc fluid collection suspicious for early perihepatic abscess formation. --IR repositioned existing drain and performed CT-guided aspiration of the new abscess (too small for a drain).  Abscess cx sent Plan: --Discharge on IV Rocephin and PO Flagyl -- to complete 4 week course (ending 02/05/23) --Weekly labs on Mondays: CBC with diff, CMP --Please pull PIC at completion of IV antibiotics --Fax weekly lab results  promptly to [phone] --Follow cultures to completion --ID consulted, appreciate recommendations -- follow up outpatient at completion of antibiotics --IR consulted.  Recommend d/c with drain in place.  Follow up in 1 week. --Twice daily drain flushes   Recurrent right pleural effusion --presumed due to liver abscess --s/p thoracentesis with 900 ml remoived   Chronic diastolic CHF (congestive heart failure) (HCC) --HOLDING Entresto, Farxiga and Lasix and BP's  stable --Will hold these on discharge to avoid hypotension --Close PCP or Cardiology follow up --Monitor BP's at home --Daily weights to monitor volume status   Paroxysmal atrial fibrillation (HCC) --Continue home Eliquis   Anemia, chronic --Stable.   --Monitor CBC in follow up   Coronary artery disease Stable. pt has no chest pain.  --cont statin   CKD stage 3, ruled out --GFR >60 --Monitor renal function in follow up   GERD (gastroesophageal reflux disease) --cont home PPI   Type 2 diabetes mellitus  --d/c'ed BG checks and SSI   Skin excoriations   -- zinc oxide barrier cream PRN   Decubitus ulcer of right buttock, stage 2  I agree with the wound description as outlined --Continue diligent wound care --Offload area  --Frequent repositioning --Monitor for signs of infection --Appreciate Wound Care RN's recommendations (01/04/23) --Diflucan x2 on admission --Farxiga d/c'd due to yeast infection, hypotension  Pressure Injury 01/08/23 Buttocks Right;Mid Stage 2 -  Partial thickness loss of dermis presenting as a shallow open injury with a red, pink wound bed without slough. pink, very shallow, macerated skin (Active)  01/08/23 1920  Location: Buttocks  Location Orientation: Right;Mid  Staging: Stage 2 -  Partial thickness loss of dermis presenting as a shallow open injury with a red, pink wound bed without slough.  Wound Description (Comments): pink, very shallow, macerated skin  Present on Admission: Yes    Bacteremia due to Escherichia coli, ruled out        Consultants: Infectious disease, Interventional radiology  Procedures performed: Perihepatic JP drain exchange   Disposition: Home health  Diet recommendation:  Cardiac and Carb modified diet  DISCHARGE MEDICATION: Allergies as of 01/12/2023   No Known Allergies      Medication List     STOP taking these medications    Entresto 24-26 MG Generic drug: sacubitril-valsartan   Farxiga 5 MG Tabs tablet Generic drug: dapagliflozin propanediol   furosemide 40 MG tablet Commonly known as: LASIX       TAKE these medications    acetaminophen 325 MG tablet Commonly known as: TYLENOL Take 1 tablet (325 mg total) by mouth at bedtime as needed  (sleep aid).   B-12 1000 MCG Caps Take by mouth daily at 2 am.   cefTRIAXone  IVPB Commonly known as: ROCEPHIN Inject 2 g into the vein daily for 23 days. Indication:  E Coli liver abscess First Dose: Yes Last Day of Therapy:  02/05/2023 Labs - Once weekly:  CBC/D and CMP, Please pull PIC at completion of IV antibiotics Fax weekly lab results  promptly to [phone] Method of administration: IV Push Method of administration may be changed at the discretion of home infusion pharmacist based upon assessment of the patient and/or caregiver's ability to self-administer the medication ordered. Start taking on: Jan 13, 2023 What changed: additional instructions   diphenhydrAMINE 25 mg capsule Commonly known as: BENADRYL Take 1 capsule (25 mg total) by mouth at bedtime as needed for sleep.   Eliquis 5 MG Tabs tablet Generic drug: apixaban TAKE 1 TABLET BY MOUTH TWICE A DAY   feeding supplement Liqd Take 237 mLs by mouth 2 (two) times daily between meals.   fenofibrate 145 MG tablet Commonly known as: TRICOR Take 145 mg by mouth daily.   liver oil-zinc oxide 40 % ointment Commonly known as: DESITIN Apply topically 3 (three) times daily.   MACULAR VITAMIN BENEFIT PO Take 1 tablet by mouth daily.   metFORMIN 500 MG tablet  Commonly known as: GLUCOPHAGE Take 500 mg by mouth daily with breakfast.   metroNIDAZOLE 500 MG tablet Commonly known as: FLAGYL Take 1 tablet (500 mg total) by mouth every 12 (twelve) hours for 51 doses.   mirtazapine 7.5 MG tablet Commonly known as: REMERON Take 1 tablet (7.5 mg total) by mouth at bedtime.   nitroGLYCERIN 0.4 MG SL tablet Commonly known as: Nitrostat Place 1 tablet (0.4 mg total) under the tongue every 5 (five) minutes as needed for chest pain.   oxyCODONE-acetaminophen 5-325 MG tablet Commonly known as: PERCOCET/ROXICET Take 1 tablet by mouth every 6 (six) hours as needed for moderate pain.   pantoprazole 40 MG tablet Commonly  known as: PROTONIX Take 1 tablet (40 mg total) by mouth daily.   rosuvastatin 10 MG tablet Commonly known as: CRESTOR TAKE 1 TABLET BY MOUTH EVERY DAY   senna-docusate 8.6-50 MG tablet Commonly known as: Senokot-S Take 1 tablet by mouth at bedtime as needed for mild constipation.   sodium chloride flush 0.9 % Soln Commonly known as: NS Inject 3 mLs into the vein every 12 (twelve) hours. What changed:  how much to take how to take this when to take this   tamsulosin 0.4 MG Caps capsule Commonly known as: FLOMAX TAKE 1 CAPSULE BY MOUTH EVERY DAY   Vitamin D3 25 MCG (1000 UT) Caps Take 1 capsule (1,000 Units total) by mouth daily.               Discharge Care Instructions  (From admission, onward)           Start     Ordered   01/12/23 0000  Change dressing on IV access line weekly and PRN  (Home infusion instructions - [facility] )        01/12/23 1239   01/12/23 0000  Discharge wound care:       Comments: Continue routine wound care to skin breakdown area Home health RN's to assist Monitor closely for infection Offload pressure to the area as much as possible Barrier ointment / cream as needed   01/12/23 1342            Follow-up Information     El-Abd, Aaron Edelman, MD Follow up in 1 week(s).   Specialties: Interventional Radiology, Diagnostic Radiology, Radiology Why: IR schedulers will call you with appointment date/time for approximately 1 week after discharge. Please call [phone] or [phone] with questions or concerns prior to your appointment Contact information: [address] [place] [address] [phone]         [facility], [facility] Follow up.   Specialty: Home Health Services Why: They will follow up with you for your home health needs. Contact information: [address] [place] [address] [phone]                Discharge Exam: Filed Weights   01/08/23 1926 01/10/23  0500 01/11/23 0523  Weight: 98.8 kg 99.5 kg 99.8 kg   General exam: awake, alert, no acute distress HEENT: atraumatic, clear conjunctiva, anicteric sclera, moist mucus membranes, hearing grossly normal  Respiratory system: CTAB, no wheezes, rales or rhonchi, normal respiratory effort. Cardiovascular system: normal S1/S2, RRR, no JVD, murmurs, rubs, gallops, no pedal edema.   Gastrointestinal system: RUQ JP drain in place with minimal serosanguinous fluid, abdomen soft, NT, ND. Central nervous system: A&O x4. no gross focal neurologic deficits, normal speech Extremities: moves all , no edema, normal tone Skin: dry,  intact, normal temperature, normal color, No rashes, lesions or ulcers Psychiatry: normal mood, congruent affect, judgement and insight appear normal   Condition at discharge: stable  The results of significant diagnostics from this hospitalization (including imaging, microbiology, ancillary and laboratory) are listed below for reference.   Imaging Studies: CT ABDOMEN PELVIS W CONTRAST  Result Date: 01/11/2023 CLINICAL DATA:  Status post hepatic abscess with drain placement. EXAM: CT ABDOMEN AND PELVIS WITH CONTRAST TECHNIQUE: Multidetector CT imaging of the abdomen and pelvis was performed using the standard protocol following bolus administration of intravenous contrast. RADIATION DOSE REDUCTION: This exam was performed according to the departmental dose-optimization program which includes automated exposure control, adjustment of the mA and/or kV according to patient size and/or use of iterative reconstruction technique. CONTRAST:  OMNIPAQUE IOHEXOL 300 MG/ML  SOLN COMPARISON:  Diagnostic CT 01/07/2023 and older. CT procedure 01/08/2023 and older FINDINGS: Lower chest: Status post median sternotomy. Calcifications along the mitral valve annulus. Calcifications along the aortic valve. Coronary artery calcifications are seen. Small hiatal hernia. There is a small right pleural  effusion, similar to the recent prior. Adjacent opacity. Trace left-sided pleural fluid with some adjacent atelectasis. Hepatobiliary: Patent portal vein. Previous cholecystectomy. There is some low attenuation seen in the liver at the dome involving segment 4/8. Nonspecific. Possibly edema with the adjacent fluid collection or other process. Adjacent fluid collections are again identified. There is pigtail catheter within an area of fluid medially anterior and caudal to this location. The collection in which the pigtail is along the margin of measures AP dimension of 3.6 cm, cephalocaudal 2.3 cm and transverse 6.0 cm. This does have some luminal air. The size of this has increased from the prior CT scan. Previously there was some high attenuation material within this collection on the procedure study of 01/08/2023. The high density is no longer seen. Please correlate with level of drainage from the pigtail. There is smaller fluid collection along the inferior margin of the right hepatic lobe laterally extending in the right pericolic gutter with loculations. This is smaller than previous examination. On coronal series 5, image 407 this collection would measure 4.1 by 2.0 Cm and previously 6.8 by 2.9 cm. No additional perihepatic well-defined fluid collections. Pancreas: Moderate atrophy of the pancreas.  No obvious mass. Spleen: Normal in size without focal abnormality. Adrenals/Urinary Tract: Adrenal glands are preserved. No enhancing renal mass. Nonobstructing 4 mm right-sided renal stone. Separate punctate lower pole stone as well. Bilateral renal cysts are once again identified, Bosniak 1 lesions. No specific imaging follow-up. These are unchanged from previous. The ureters have normal course and caliber down to the bladder. Preserved contours of the urinary bladder. Stomach/Bowel: Oral contrast was administered. The large bowel has a normal course and caliber with left-sided colonic diverticulosis. Normal  appendix seen retrocecal in the right hemipelvis. Stomach is underdistended. Third portion duodenal diverticula. Small bowel is nondilated. There are some small bowel diverticula seen as well. Vascular/Lymphatic: Normal caliber aorta and IVC with scattered vascular calcifications. Prominent but not pathologically enlarged retroperitoneal nodes identified in the upper abdomen including portacaval and periceliac. These are unchanged from previous. Reproductive: Prostate is unremarkable. Other: Small fat containing inguinal hernias. Anasarca. Diffuse mesenteric haziness. No free air. Persistent presacral fat stranding, nonspecific. Musculoskeletal: Scattered degenerative changes are seen along the spine. Degenerative changes of the pelvis. IMPRESSION: Pigtail catheter again seen along the margin of the fluid collection in the space anterior to the liver with soft tissue thickening and stranding. This is  larger than the recent procedure study of 01/08/2023 but the previous high density is no longer identified. Please correlate with level of drainage from the catheter. Second fluid collection along the caudal margin of the liver laterally extending in the pericolic gutter is multiloculated but smaller. No new rim enhancing fluid collections. Diffuse mesenteric stranding anasarca. No bowel obstruction. Scattered large and small bowel diverticula. Small hiatal hernia. Stable small right and tiny left pleural effusion with adjacent opacity. Postop chest.  Calcifications in the area of the aortic valve. Nonobstructing right-sided renal stones. Electronically Signed   By: Karen Kays M.D.   On: 01/11/2023 18:14   CT ASPIRATION N/S  Result Date: 01/10/2023 INDICATION: Pleural effusion, subhepatic fluid collection EXAM: 1. CT-guided right thoracentesis 2. CT-guided aspiration of intra-abdominal subhepatic fluid collection MEDICATIONS: None. ANESTHESIA/SEDATION: Moderate (conscious) sedation was employed during this  procedure. A total of Versed 2 mg and Fentanyl 50 mcg was administered intravenously. Moderate Sedation Time: 22 minutes. The patient's level of consciousness and vital signs were monitored continuously by radiology nursing throughout the procedure under my direct supervision. FLUOROSCOPY TIME:  N/a COMPLICATIONS: None immediate. PROCEDURE: Informed written consent was obtained from the patient after a thorough discussion of the procedural risks, benefits and alternatives. All questions were addressed. Maximal Sterile Barrier Technique was utilized including caps, mask, sterile gowns, sterile gloves, sterile drape, hand hygiene and skin antiseptic. A timeout was performed prior to the initiation of the procedure. The patient was placed supine on the exam table. Limited CT of the chest was performed for planning purposes. This demonstrated a moderate right pleural effusion. Skin entry site was marked, the overlying skin was prepped and draped in the standard sterile fashion. Local analgesia was obtained with 1% lidocaine. Using intermittent CT fluoroscopy, a 19 gauge Yueh catheter was advanced into the right pleural fluid. Location was confirmed with CT. Approximately 100 mL of clear, straw-colored fluid was removed. Needle was removed, and a sterile dressing was placed. Limited postprocedure CT demonstrated no pneumothorax. Attention was then turned to the abdomen. Limited CT of the abdomen and pelvis was performed for planning purposes. This again demonstrated a small subhepatic fluid collection. Skin entry site was marked, and the overlying skin was prepped and draped in the standard sterile fashion. Local analgesia was obtained with 1% lidocaine. Using intermittent CT fluoroscopy, a 19 gauge Yueh catheter was advanced into the subhepatic fluid collection. Despite multiple needle repositioning, only a scant amount approximately 2 mL of thin yellow fluid was aspirated. Given lack of gross purulence, a drainage  catheter was not placed. Needle was removed, and a clean dressing was placed. The patient tolerated all aspects of the procedure well without immediate complication. IMPRESSION: 1. Successful CT-guided right thoracentesis yielding that 100 mL of clear pleural fluid. 2. Successful CT-guided aspiration/sampling of subhepatic fluid collection yielding small volume of thin yellow fluid. Findings favor loculated ascites. Sample was sent for microbiology analysis. No drain placed at this time. Electronically Signed   By: Olive Bass M.D.   On: 01/10/2023 16:50   CT ASPIRATION N/S  Result Date: 01/10/2023 INDICATION: Pleural effusion, subhepatic fluid collection EXAM: 1. CT-guided right thoracentesis 2. CT-guided aspiration of intra-abdominal subhepatic fluid collection MEDICATIONS: None. ANESTHESIA/SEDATION: Moderate (conscious) sedation was employed during this procedure. A total of Versed 2 mg and Fentanyl 50 mcg was administered intravenously. Moderate Sedation Time: 22 minutes. The patient's level of consciousness and vital signs were monitored continuously by radiology nursing throughout the procedure under my direct supervision. FLUOROSCOPY  TIME:  N/a COMPLICATIONS: None immediate. PROCEDURE: Informed written consent was obtained from the patient after a thorough discussion of the procedural risks, benefits and alternatives. All questions were addressed. Maximal Sterile Barrier Technique was utilized including caps, mask, sterile gowns, sterile gloves, sterile drape, hand hygiene and skin antiseptic. A timeout was performed prior to the initiation of the procedure. The patient was placed supine on the exam table. Limited CT of the chest was performed for planning purposes. This demonstrated a moderate right pleural effusion. Skin entry site was marked, the overlying skin was prepped and draped in the standard sterile fashion. Local analgesia was obtained with 1% lidocaine. Using intermittent CT fluoroscopy, a  19 gauge Yueh catheter was advanced into the right pleural fluid. Location was confirmed with CT. Approximately 100 mL of clear, straw-colored fluid was removed. Needle was removed, and a sterile dressing was placed. Limited postprocedure CT demonstrated no pneumothorax. Attention was then turned to the abdomen. Limited CT of the abdomen and pelvis was performed for planning purposes. This again demonstrated a small subhepatic fluid collection. Skin entry site was marked, and the overlying skin was prepped and draped in the standard sterile fashion. Local analgesia was obtained with 1% lidocaine. Using intermittent CT fluoroscopy, a 19 gauge Yueh catheter was advanced into the subhepatic fluid collection. Despite multiple needle repositioning, only a scant amount approximately 2 mL of thin yellow fluid was aspirated. Given lack of gross purulence, a drainage catheter was not placed. Needle was removed, and a clean dressing was placed. The patient tolerated all aspects of the procedure well without immediate complication. IMPRESSION: 1. Successful CT-guided right thoracentesis yielding that 100 mL of clear pleural fluid. 2. Successful CT-guided aspiration/sampling of subhepatic fluid collection yielding small volume of thin yellow fluid. Findings favor loculated ascites. Sample was sent for microbiology analysis. No drain placed at this time. Electronically Signed   By: Olive Bass M.D.   On: 01/10/2023 16:50   IR Catheter Tube Change  Result Date: 01/08/2023 INDICATION: Persistent fluid collection status post percutaneous drainage EXAM: Drainage catheter exchange and repositioning using fluoroscopic guidance MEDICATIONS: Documented in the EMR ANESTHESIA/SEDATION: Local analgesia COMPLICATIONS: None immediate. PROCEDURE: Informed written consent was obtained from the patient after a thorough discussion of the procedural risks, benefits and alternatives. All questions were addressed. Maximal Sterile Barrier  Technique was utilized including caps, mask, sterile gowns, sterile gloves, sterile drape, hand hygiene and skin antiseptic. A timeout was performed prior to the initiation of the procedure. The patient was placed supine on the exam table. The right upper quadrant was prepped and draped in the standard sterile fashion with inclusion of the existing percutaneous drainage catheter within the sterile field. Injection of the initial drainage catheter demonstrated communication with the more lateral and persistent component. Over an Amplatz wire, the existing drainage catheter was removed. Using combination of an angled catheter and wire, access into the more lateral undrained component was obtained. Location was confirmed with injection of contrast material. Over an Amplatz wire, a new 12 French locking drainage catheter was advanced into the fluid collection. Location was again confirmed with injection of contrast material. Locking loop was formed, and the drainage catheter was secured to the skin using silk suture and a dressing. It was attached to bulb suction. The patient tolerated the procedure well without immediate complication. IMPRESSION: Successful repositioning and exchange of the 12 French locking drainage catheter within the fluid collection near the hepatic dome. Drainage catheter placed to bulb suction. Electronically Signed  By: Olive Bass M.D.   On: 01/08/2023 11:06   CT ABDOMEN PELVIS W CONTRAST  Result Date: 01/07/2023 CLINICAL DATA:  Right lower quadrant abdominal pain EXAM: CT ABDOMEN AND PELVIS WITH CONTRAST TECHNIQUE: Multidetector CT imaging of the abdomen and pelvis was performed using the standard protocol following bolus administration of intravenous contrast. RADIATION DOSE REDUCTION: This exam was performed according to the departmental dose-optimization program which includes automated exposure control, adjustment of the mA and/or kV according to patient size and/or use of  iterative reconstruction technique. CONTRAST:  OMNIPAQUE IOHEXOL 300 MG/ML  SOLN COMPARISON:  12/28/2022 FINDINGS: Lower chest: Large right pleural effusion with passive atelectasis. Borderline cardiomegaly. Mitral valve calcification. Coronary and descending thoracic aortic atherosclerotic vascular calcification. Hepatobiliary: Markedly reduced size of the anterior perihepatic abscess with a pigtail drainage catheter in place. Residual fluid density in this vicinity proximally 6.2 by 1.4 by 1.0 cm (volume = 4.5 cm^3), formerly 15.8 by 7.1 by 9.8 cm (volume = 580 cm^3) prior to drain placement. Tiny locules of gas within the collection. Along the inferior right hepatic lobe margin, a new fluid collection with enhancing margins measures 7.4 by 1.2 by 8.8 cm (volume = 41 cm^3) and is suspicious for early perihepatic abscess formation. This is shown for example on image 52 series 5. Surrounding inflammatory stranding tracking along the right paracolic gutter region and right omentum as on image 48 series 2. Chronic relative atrophy in the lateral segment left hepatic lobe with some mild anterior hypoenhancement in the lateral segment which is chronic probably from remote scarring. The gallbladder is absent. Pancreas: Unremarkable Spleen: Unremarkable Adrenals/Urinary Tract: Stable nonobstructive right nephrolithiasis. Stable bilateral renal cysts not substantially changed over the last 10 days. No further imaging workup of these lesions is indicated. No hydronephrosis or hydroureter. The urinary bladder appears unremarkable. Stomach/Bowel: Small periampullary duodenal diverticulum. Vascular/Lymphatic: Atherosclerosis is present, including aortoiliac atherosclerotic disease. Atheromatous plaque noted at the origins of the celiac trunk and SMA without occlusion. Reproductive: Unremarkable Other: In addition to the inflammatory stranding along the right omentum and right paracolic gutter, there is asymmetric  subcutaneous edema in the overlying soft tissues example on image 25 series 2, although no obvious connection between these different anatomic compartments. There is some cutaneous thickening in this region. Mild nonspecific presacral edema. Musculoskeletal: Mild lumbar spondylosis and degenerative disc disease. IMPRESSION: 1. Markedly reduced size of the anterior perihepatic abscess with a pigtail drainage catheter in place. 2. Along the inferior right hepatic lobe margin, there is a new 41 cc fluid collection with enhancing margins suspicious for early perihepatic abscess formation. Adjacent abnormal infiltrative edema tracks along the paracolic gutter and omentum, and there is overlying subcutaneous edema and cutaneous thickening along the right abdomen. 3. Large right pleural effusion with passive atelectasis. 4. Stable nonobstructive right nephrolithiasis. Aortic Atherosclerosis (ICD10-I70.0). Electronically Signed   By: Gaylyn Rong M.D.   On: 01/07/2023 17:21   [place] EKG SITE RITE  Result Date: 01/02/2023 If Site Rite image not attached, placement could not be confirmed due to current cardiac rhythm.  DG Chest 2 View  Result Date: 01/01/2023 CLINICAL DATA:  Pleural effusion RIGHT EXAM: CHEST - 2 VIEW COMPARISON:  12/29/2022 FINDINGS: Upper normal size of cardiac silhouette post CABG. Mediastinal contours and pulmonary vascularity normal. Atherosclerotic calcification aorta. Bibasilar atelectasis and trace pleural effusions. Upper lungs clear. No pneumothorax or acute osseous findings. IMPRESSION: Bibasilar atelectasis and trace pleural effusions. Aortic Atherosclerosis (ICD10-I70.0). Electronically Signed   By: Ulyses Southward  M.D.   On: 01/01/2023 10:51   ECHOCARDIOGRAM COMPLETE  Result Date: 12/30/2022    ECHOCARDIOGRAM REPORT   Patient Name:   [name] Young. Date of Exam: 12/30/2022 Medical Rec #:  [id number]            Height:       70.5 in Accession #:    [id number]           Weight:        217.0 lb Date of Birth:  31-Dec-1940           BSA:          2.172 m Patient Age:    81 years             BP:           111/59 mmHg Patient Gender: M                    HR:           88 bpm. Exam Location:  [facility] Procedure: 2D Echo Indications:     Dyspnea R06.00                  Endocarditis I38  History:         Patient has prior history of Echocardiogram examinations, most                  recent 01/11/2022.  Sonographer:     Overton Mam RDCS, FASE Referring Phys:  [id number] AMY N [facility] Diagnosing Phys: Debbe Odea MD  Sonographer Comments: Technically difficult study due to poor echo windows and no subcostal window. Image acquisition challenging due to respiratory motion. IMPRESSIONS  1. Left ventricular ejection fraction, by estimation, is 50 to 55%. Left ventricular ejection fraction by PLAX is 50 %. The left ventricle has low normal function. The left ventricle has no regional wall motion abnormalities. Left ventricular diastolic parameters are consistent with Grade I diastolic dysfunction (impaired relaxation).  2. Right ventricular systolic function is normal. The right ventricular size is normal.  3. The mitral valve is degenerative. Mild mitral valve regurgitation. Moderate mitral annular calcification.  4. Aortic valve mean gradient 19 mmHg, peak gradient 35 mmHg, vmax 2.19m/s. The aortic valve is calcified. Aortic valve regurgitation is mild. Mild to moderate aortic valve stenosis.  5. Aortic dilatation noted. There is mild dilatation of the aortic root, measuring 40 mm. FINDINGS  Left Ventricle: Left ventricular ejection fraction, by estimation, is 50 to 55%. Left ventricular ejection fraction by PLAX is 50 %. The left ventricle has low normal function. The left ventricle has no regional wall motion abnormalities. The left ventricular internal cavity size was normal in size. There is no left ventricular hypertrophy. Left ventricular diastolic parameters are consistent with Grade I diastolic  dysfunction (impaired relaxation). Right Ventricle: The right ventricular size is normal. No increase in right ventricular wall thickness. Right ventricular systolic function is normal. Left Atrium: Left atrial size was normal in size. Right Atrium: Right atrial size was normal in size. Pericardium: There is no evidence of pericardial effusion. Mitral Valve: The mitral valve is degenerative in appearance. Moderate mitral annular calcification. Mild mitral valve regurgitation. MV peak gradient, 5.7 mmHg. The mean mitral valve gradient is 2.0 mmHg. Tricuspid Valve: The tricuspid valve is grossly normal. Tricuspid valve regurgitation is mild. Aortic Valve: Aortic valve mean gradient 19 mmHg, peak gradient 35 mmHg, vmax 2.68m/s. The aortic valve is calcified. Aortic valve regurgitation is  mild. Aortic regurgitation PHT measures 638 msec. Mild to moderate aortic stenosis is present. Aortic valve  mean gradient measures 15.0 mmHg. Aortic valve peak gradient measures 28.0 mmHg. Aortic valve area, by VTI measures 2.83 cm. Pulmonic Valve: The pulmonic valve was normal in structure. Pulmonic valve regurgitation is mild. Aorta: Aortic dilatation noted. There is mild dilatation of the aortic root, measuring 40 mm. Venous: The inferior vena cava was not well visualized. IAS/Shunts: No atrial level shunt detected by color flow Doppler.  LEFT VENTRICLE PLAX 2D LV EF:         Left            Diastology                ventricular     LV e' medial:    3.70 cm/s                ejection        LV E/e' medial:  18.0                fraction by     LV e' lateral:   12.50 cm/s                PLAX is 50      LV E/e' lateral: 5.3                %. LVIDd:         4.70 cm LVIDs:         3.50 cm LV PW:         1.20 cm LV IVS:        1.20 cm LVOT diam:     2.40 cm LV SV:         127 LV SV Index:   58 LVOT Area:     4.52 cm  LV Volumes (MOD) LV vol d, MOD    104.0 ml A4C: LV vol s, MOD    41.3 ml A4C: LV SV MOD A4C:   104.0 ml RIGHT VENTRICLE RV  Basal diam:  2.60 cm RV S prime:     15.20 cm/s LEFT ATRIUM             Index        RIGHT ATRIUM           Index LA diam:        3.50 cm 1.61 cm/m   RA Area:     13.20 cm LA Vol (A2C):   35.2 ml 16.20 ml/m  RA Volume:   33.00 ml  15.19 ml/m LA Vol (A4C):   43.7 ml 20.12 ml/m LA Biplane Vol: 41.5 ml 19.10 ml/m  AORTIC VALVE                     PULMONIC VALVE AV Area (Vmax):    2.12 cm      PV Vmax:        1.78 m/s AV Area (Vmean):   2.00 cm      PV Peak grad:   12.7 mmHg AV Area (VTI):     2.83 cm      RVOT Peak grad: 2 mmHg AV Vmax:           264.67 cm/s AV Vmean:          179.333 cm/s AV VTI:            0.448 m AV Peak Grad:      28.0 mmHg  AV Mean Grad:      15.0 mmHg LVOT Vmax:         124.00 cm/s LVOT Vmean:        79.200 cm/s LVOT VTI:          0.280 m LVOT/AV VTI ratio: 0.62 AI PHT:            638 msec  AORTA Ao Root diam: 4.00 cm MITRAL VALVE MV Area (PHT): 2.28 cm     SHUNTS MV Area VTI:   4.01 cm     Systemic VTI:  0.28 m MV Peak grad:  5.7 mmHg     Systemic Diam: 2.40 cm MV Mean grad:  2.0 mmHg MV Vmax:       1.19 m/s MV Vmean:      59.3 cm/s MV Decel Time: 333 msec MV E velocity: 66.60 cm/s MV A velocity: 119.00 cm/s MV E/A ratio:  0.56 Debbe Odea MD Electronically signed by Debbe Odea MD Signature Date/Time: 12/30/2022/4:15:35 PM    Final    IR THORACENTESIS ASP PLEURAL SPACE W/IMG GUIDE  Result Date: 12/29/2022 INDICATION: 82 year old with large perihepatic abscess and right pleural effusion. EXAM: 1. Ultrasound-guided placement of drainage catheter in the perihepatic abscess 2. Ultrasound-guided right thoracentesis MEDICATIONS: Moderate sedation ANESTHESIA/SEDATION: Moderate (conscious) sedation was employed during this procedure. A total of Versed 2mg  and fentanyl 50 mcg was administered intravenously at the order of the provider performing the procedure. Total intra-service moderate sedation time: 47 minutes. Patient's level of consciousness and vital signs were monitored  continuously by radiology nurse throughout the procedure under the supervision of the provider performing the procedure. COMPLICATIONS: None immediate. PROCEDURE: Informed written consent was obtained from the patient after a thorough discussion of the procedural risks, benefits and alternatives. All questions were addressed. Maximal Sterile Barrier Technique was utilized including caps, mask, sterile gowns, sterile gloves, sterile drape, hand hygiene and skin antiseptic. A timeout was performed prior to the initiation of the procedure. Right upper abdomen was evaluated with ultrasound. The large perihepatic abscess was identified. The right upper abdomen was prepped with chlorhexidine and sterile field was created. Skin was anesthetized using 1% lidocaine. Small incision was made. Using ultrasound guidance, Yueh catheter was directed into the large perihepatic abscess. Yellow purulent fluid was aspirated. Superstiff Amplatz wire was placed. The tract was dilated to accommodate a 12 [place] multipurpose drain. 550 mL of purulent fluid was removed. Drain was sutured to the skin and attached to a suction bulb. Fluid was sent for culture. The right posterior chest was evaluated with ultrasound. Right pleural fluid was identified with ultrasound. A new sterile tray was opened. Right posterior chest was prepped with chlorhexidine and a sterile field was created. Skin was anesthetized using 1% lidocaine. Small incision was made. Using ultrasound guidance, a Safe-T-Centesis catheter was directed into the right pleural space. Yellow fluid was aspirated. 900 mL of fluid was removed. Catheter was removed and a bandage was placed. FINDINGS: 550 mL of purulent fluid was removed from the perihepatic collection. Perihepatic abscess appeared to be decompressed at the end of the procedure. 900 mL of yellow fluid was removed from the right pleural space. IMPRESSION: 1. Ultrasound-guided placement of a drainage catheter in the large  perihepatic abscess. 2. Ultrasound-guided right thoracentesis. Electronically Signed   By: Richarda Overlie M.D.   On: 12/29/2022 20:00   IR IMAGE GUIDED DRAINAGE BY PERCUTANEOUS CATHETER  Result Date: 12/29/2022 INDICATION: 82 year old with large perihepatic abscess and right pleural effusion. EXAM: 1.  Ultrasound-guided placement of drainage catheter in the perihepatic abscess 2. Ultrasound-guided right thoracentesis MEDICATIONS: Moderate sedation ANESTHESIA/SEDATION: Moderate (conscious) sedation was employed during this procedure. A total of Versed 2mg  and fentanyl 50 mcg was administered intravenously at the order of the provider performing the procedure. Total intra-service moderate sedation time: 47 minutes. Patient's level of consciousness and vital signs were monitored continuously by radiology nurse throughout the procedure under the supervision of the provider performing the procedure. COMPLICATIONS: None immediate. PROCEDURE: Informed written consent was obtained from the patient after a thorough discussion of the procedural risks, benefits and alternatives. All questions were addressed. Maximal Sterile Barrier Technique was utilized including caps, mask, sterile gowns, sterile gloves, sterile drape, hand hygiene and skin antiseptic. A timeout was performed prior to the initiation of the procedure. Right upper abdomen was evaluated with ultrasound. The large perihepatic abscess was identified. The right upper abdomen was prepped with chlorhexidine and sterile field was created. Skin was anesthetized using 1% lidocaine. Small incision was made. Using ultrasound guidance, Yueh catheter was directed into the large perihepatic abscess. Yellow purulent fluid was aspirated. Superstiff Amplatz wire was placed. The tract was dilated to accommodate a 12 [place] multipurpose drain. 550 mL of purulent fluid was removed. Drain was sutured to the skin and attached to a suction bulb. Fluid was sent for culture. The right  posterior chest was evaluated with ultrasound. Right pleural fluid was identified with ultrasound. A new sterile tray was opened. Right posterior chest was prepped with chlorhexidine and a sterile field was created. Skin was anesthetized using 1% lidocaine. Small incision was made. Using ultrasound guidance, a Safe-T-Centesis catheter was directed into the right pleural space. Yellow fluid was aspirated. 900 mL of fluid was removed. Catheter was removed and a bandage was placed. FINDINGS: 550 mL of purulent fluid was removed from the perihepatic collection. Perihepatic abscess appeared to be decompressed at the end of the procedure. 900 mL of yellow fluid was removed from the right pleural space. IMPRESSION: 1. Ultrasound-guided placement of a drainage catheter in the large perihepatic abscess. 2. Ultrasound-guided right thoracentesis. Electronically Signed   By: Richarda Overlie M.D.   On: 12/29/2022 20:00   DG Chest Port 1 View  Result Date: 12/29/2022 CLINICAL DATA:  Post thoracentesis. EXAM: PORTABLE CHEST 1 VIEW COMPARISON:  12/28/2022 FINDINGS: Sternotomy wires unchanged. Lungs are adequately inflated with interval improvement and previously seen right pleural effusion as no residual pleural fluid is identified. No evidence of right-sided pneumothorax. Left lung is clear. Cardiomediastinal silhouette and remainder of the exam is unchanged. IMPRESSION: Interval improvement in previously seen right pleural effusion post thoracentesis. No pneumothorax. Electronically Signed   By: Elberta Fortis M.D.   On: 12/29/2022 16:02   CT Angio Chest PE W and/or Wo Contrast  Result Date: 12/28/2022 CLINICAL DATA:  Headache, short of breath, weakness, history of endocarditis, abdominal pain, nonlocalized right upper quadrant pain EXAM: CT ANGIOGRAPHY CHEST CT ABDOMEN AND PELVIS WITH CONTRAST TECHNIQUE: Multidetector CT imaging of the chest was performed using the standard protocol during bolus administration of intravenous  contrast. Multiplanar CT image reconstructions and MIPs were obtained to evaluate the vascular anatomy. Multidetector CT imaging of the abdomen and pelvis was performed using the standard protocol during bolus administration of intravenous contrast. RADIATION DOSE REDUCTION: This exam was performed according to the departmental dose-optimization program which includes automated exposure control, adjustment of the mA and/or kV according to patient size and/or use of iterative reconstruction technique. CONTRAST:  OMNIPAQUE IOHEXOL 350  MG/ML SOLN COMPARISON:  05/10/2022, 10/23/2021 FINDINGS: CTA CHEST FINDINGS Cardiovascular: This is a technically adequate evaluation of the pulmonary vasculature. No filling defects or pulmonary emboli. The heart is enlarged, with no evidence of pericardial effusion. There is calcification of the aortic and mitral valves. Normal caliber of the thoracic aorta. No evidence of aneurysm or dissection. Postsurgical changes from prior CABG. Atherosclerosis of the aorta and native coronary vessels. Mediastinum/Nodes: No enlarged mediastinal, hilar, or axillary lymph nodes. Thyroid gland, trachea, and esophagus demonstrate no significant findings. Lungs/Pleura: There is a large right pleural effusion, volume estimated in excess of 1 L. minimal compressive atelectasis of the right lower lobe. No airspace disease or pneumothorax. Minimal mucoid material within the trachea. Otherwise the central airways are patent. Musculoskeletal: No acute or destructive bony lesions. Reconstructed images demonstrate no additional findings. Review of the MIP images confirms the above findings. CT ABDOMEN and PELVIS FINDINGS Hepatobiliary: Since the prior exam, the complex fluid collection along the ventral aspect of the liver has enlarged, measuring 15.8 x 8.6 by 9.8 cm on today's exam. Continued mural thickening with internal septations concerning for subcapsular or Peri hepatic abscess. This is amenable  to percutaneous sampling if not previously performed. The remainder of the liver is unremarkable. Gallbladder is surgically absent. No biliary duct dilation. Pancreas: Unremarkable. No pancreatic ductal dilatation or surrounding inflammatory changes. Spleen: Normal in size without focal abnormality. Adrenals/Urinary Tract: Stable bilateral renal cysts do not require specific imaging follow-up. Stable nonobstructing right renal calculi, measuring up to 5 mm. No obstructive uropathy within either kidney. The adrenals and bladder are unremarkable. Stomach/Bowel: No bowel obstruction or ileus. Diffuse colonic diverticulosis without evidence of diverticulitis. No bowel wall thickening. Vascular/Lymphatic: Aortic atherosclerosis. No enlarged abdominal or pelvic lymph nodes. Reproductive: Mild enlargement of the prostate unchanged. Other: No free fluid or free intraperitoneal gas. Evidence of prior umbilical hernia repair. Stable fat containing bilateral inguinal hernias. No bowel herniation. Musculoskeletal: No acute or destructive bony lesions. Reconstructed images demonstrate no additional findings. Review of the MIP images confirms the above findings. IMPRESSION: Chest: 1. No evidence of pulmonary embolus. 2. Large right pleural effusion, volume estimated in excess of 1 L. 3. Cardiomegaly. 4. Aortic and mitral valve calcifications. 5.  Aortic Atherosclerosis (ICD10-I70.0). Abdomen/pelvis: 1. Enlarging 15.8 x 8.6 x 9.8 cm complex fluid collection along the ventral margin of the liver, increased since prior exam and consistent with enlarging subcapsular or perihepatic abscess. This is amenable to percutaneous aspiration/drainage. 2. Nonobstructing right renal calculi. 3. Diffuse colonic diverticulosis without diverticulitis. 4.  Aortic Atherosclerosis (ICD10-I70.0). Electronically Signed   By: Sharlet Salina M.D.   On: 12/28/2022 14:57   CT ABDOMEN PELVIS W CONTRAST  Result Date: 12/28/2022 CLINICAL DATA:  Headache,  short of breath, weakness, history of endocarditis, abdominal pain, nonlocalized right upper quadrant pain EXAM: CT ANGIOGRAPHY CHEST CT ABDOMEN AND PELVIS WITH CONTRAST TECHNIQUE: Multidetector CT imaging of the chest was performed using the standard protocol during bolus administration of intravenous contrast. Multiplanar CT image reconstructions and MIPs were obtained to evaluate the vascular anatomy. Multidetector CT imaging of the abdomen and pelvis was performed using the standard protocol during bolus administration of intravenous contrast. RADIATION DOSE REDUCTION: This exam was performed according to the departmental dose-optimization program which includes automated exposure control, adjustment of the mA and/or kV according to patient size and/or use of iterative reconstruction technique. CONTRAST:  OMNIPAQUE IOHEXOL 350 MG/ML SOLN COMPARISON:  05/10/2022, 10/23/2021 FINDINGS: CTA CHEST FINDINGS Cardiovascular: This is a technically  adequate evaluation of the pulmonary vasculature. No filling defects or pulmonary emboli. The heart is enlarged, with no evidence of pericardial effusion. There is calcification of the aortic and mitral valves. Normal caliber of the thoracic aorta. No evidence of aneurysm or dissection. Postsurgical changes from prior CABG. Atherosclerosis of the aorta and native coronary vessels. Mediastinum/Nodes: No enlarged mediastinal, hilar, or axillary lymph nodes. Thyroid gland, trachea, and esophagus demonstrate no significant findings. Lungs/Pleura: There is a large right pleural effusion, volume estimated in excess of 1 L. minimal compressive atelectasis of the right lower lobe. No airspace disease or pneumothorax. Minimal mucoid material within the trachea. Otherwise the central airways are patent. Musculoskeletal: No acute or destructive bony lesions. Reconstructed images demonstrate no additional findings. Review of the MIP images confirms the above findings. CT ABDOMEN and  PELVIS FINDINGS Hepatobiliary: Since the prior exam, the complex fluid collection along the ventral aspect of the liver has enlarged, measuring 15.8 x 8.6 by 9.8 cm on today's exam. Continued mural thickening with internal septations concerning for subcapsular or Peri hepatic abscess. This is amenable to percutaneous sampling if not previously performed. The remainder of the liver is unremarkable. Gallbladder is surgically absent. No biliary duct dilation. Pancreas: Unremarkable. No pancreatic ductal dilatation or surrounding inflammatory changes. Spleen: Normal in size without focal abnormality. Adrenals/Urinary Tract: Stable bilateral renal cysts do not require specific imaging follow-up. Stable nonobstructing right renal calculi, measuring up to 5 mm. No obstructive uropathy within either kidney. The adrenals and bladder are unremarkable. Stomach/Bowel: No bowel obstruction or ileus. Diffuse colonic diverticulosis without evidence of diverticulitis. No bowel wall thickening. Vascular/Lymphatic: Aortic atherosclerosis. No enlarged abdominal or pelvic lymph nodes. Reproductive: Mild enlargement of the prostate unchanged. Other: No free fluid or free intraperitoneal gas. Evidence of prior umbilical hernia repair. Stable fat containing bilateral inguinal hernias. No bowel herniation. Musculoskeletal: No acute or destructive bony lesions. Reconstructed images demonstrate no additional findings. Review of the MIP images confirms the above findings. IMPRESSION: Chest: 1. No evidence of pulmonary embolus. 2. Large right pleural effusion, volume estimated in excess of 1 L. 3. Cardiomegaly. 4. Aortic and mitral valve calcifications. 5.  Aortic Atherosclerosis (ICD10-I70.0). Abdomen/pelvis: 1. Enlarging 15.8 x 8.6 x 9.8 cm complex fluid collection along the ventral margin of the liver, increased since prior exam and consistent with enlarging subcapsular or perihepatic abscess. This is amenable to percutaneous  aspiration/drainage. 2. Nonobstructing right renal calculi. 3. Diffuse colonic diverticulosis without diverticulitis. 4.  Aortic Atherosclerosis (ICD10-I70.0). Electronically Signed   By: Sharlet Salina M.D.   On: 12/28/2022 14:57   CT HEAD WO CONTRAST ( )  Result Date: 12/28/2022 CLINICAL DATA:  Headache, shortness of breath, weakness, on Eliquis EXAM: CT HEAD WITHOUT CONTRAST TECHNIQUE: Contiguous axial images were obtained from the base of the skull through the vertex without intravenous contrast. RADIATION DOSE REDUCTION: This exam was performed according to the departmental dose-optimization program which includes automated exposure control, adjustment of the mA and/or kV according to patient size and/or use of iterative reconstruction technique. COMPARISON:  10/23/2021 FINDINGS: Brain: No evidence of acute infarction, hemorrhage, mass, mass effect, or midline shift. No hydrocephalus or extra-axial fluid collection. Periventricular white matter changes, likely the sequela of chronic small vessel ischemic disease. Cerebral volume is within normal limits for age. Vascular: No hyperdense vessel. Atherosclerotic calcifications in the intracranial carotid and vertebral arteries. Skull: Negative for fracture or focal lesion. Sinuses/Orbits: No acute finding. Status post bilateral lens replacements. Other: The mastoid air cells are well aerated. IMPRESSION: No  acute intracranial process. Electronically Signed   By: Wiliam Ke M.D.   On: 12/28/2022 14:46   DG Chest 2 View  Result Date: 12/28/2022 CLINICAL DATA:  SOB EXAM: CHEST - 2 VIEW COMPARISON:  12/26/2022. FINDINGS: Enlarged cardiac silhouette. Small right-sided pleural effusion. No pneumothorax. Calcified aorta. Status post median sternotomy and CABG. IMPRESSION: Enlarged cardiac silhouette.  Small right-sided pleural effusion. Electronically Signed   By: Layla Maw M.D.   On: 12/28/2022 12:36   DG Chest 2 View  Result Date:  12/26/2022 CLINICAL DATA:  Dyspnea. Pleural effusion post thoracentesis last week. EXAM: CHEST - 2 VIEW COMPARISON:  Radiographs 12/22/2022 and 12/21/2022.  CT 10/23/2021. FINDINGS: The heart size and mediastinal contours are stable status post median sternotomy and CABG. Small residual right pleural effusion without significant recurrence. There is no pneumothorax, edema or confluent airspace opacity. The bones appear unchanged. IMPRESSION: Small residual right pleural effusion without significant recurrence. No acute cardiopulmonary process. Electronically Signed   By: Carey Bullocks M.D.   On: 12/26/2022 13:09   US THORACENTESIS ASP PLEURAL SPACE W/IMG GUIDE  Result Date: 12/22/2022 INDICATION: Right pleural effusion EXAM: ULTRASOUND GUIDED RIGHT THORACENTESIS MEDICATIONS: 8 cc 1% lidocaine COMPLICATIONS: None immediate. PROCEDURE: An ultrasound guided thoracentesis was thoroughly discussed with the patient and questions answered. The benefits, risks, alternatives and complications were also discussed. The patient understands and wishes to proceed with the procedure. Written consent was obtained. Ultrasound was performed to localize and mark an adequate pocket of fluid in the right chest. The area was then prepped and draped in the normal sterile fashion. 1% Lidocaine was used for local anesthesia. Under ultrasound guidance a 6 Fr Safe-T-Centesis catheter was introduced. Thoracentesis was performed. The catheter was removed and a dressing applied. FINDINGS: A total of approximately 1.0 L of cloudy yellow fluid was removed. Samples were sent to the laboratory as requested by the clinical team. IMPRESSION: Successful ultrasound guided right thoracentesis yielding 1.0 L of pleural fluid. Follow-up chest x-ray revealed no evidence of pneumothorax. Read by: Mina Marble, PA-C Electronically Signed   By: Judie Petit.  Shick M.D.   On: 12/22/2022 14:45   DG Chest 2 View  Result Date: 12/22/2022 CLINICAL DATA:   82 year old male with a history of right pleural effusion status post thoracentesis in February. Recurrence. EXAM: CHEST - 2 VIEW COMPARISON:  Post thoracentesis portable chest x-ray 10/13/2022 and earlier. FINDINGS: PA and lateral views on 12/21/2022. Previous CABG. Stable cardiac size and mediastinal contours. Visualized tracheal air column is within normal limits. The left lung appears stable and clear. Small to moderate right pleural effusion appears to be layering, and is increased since February. No pneumothorax or pulmonary edema. No acute osseous abnormality identified. Negative visible bowel gas. IMPRESSION: 1. Small to moderate right pleural effusion, progressed since February. 2. No other acute cardiopulmonary abnormality. Electronically Signed   By: Odessa Fleming M.D.   On: 12/22/2022 14:25   DG Chest Port 1 View  Result Date: 12/22/2022 CLINICAL DATA:  82 year old male status post ultrasound guided right side thoracentesis this afternoon. EXAM: PORTABLE CHEST 1 VIEW COMPARISON:  Ultrasound thoracentesis images 13 38 hours. Two view chest yesterday and earlier. FINDINGS: Portable AP upright view at 1410 hours. Substantially regressed, nearly resolved right pleural effusion. No pneumothorax. Stable cardiac size and mediastinal contours. Prior CABG. The left lung remains clear when allowing portable technique. No acute osseous abnormality identified. Paucity of bowel gas in the visible abdomen. IMPRESSION: Nearly resolved right pleural effusion following thoracentesis. No pneumothorax  or other acute cardiopulmonary abnormality. Electronically Signed   By: Odessa Fleming M.D.   On: 12/22/2022 14:23    Microbiology: Results for orders placed or performed during the hospital encounter of 01/07/23  Aerobic/Anaerobic Culture w Gram Stain (surgical/deep wound)     Status: None (Preliminary result)   Collection Time: 01/08/23  4:39 PM   Specimen: Abdomen; Body Fluid  Result Value Ref Range Status   Specimen  Description   Final    ABDOMEN Performed at [facility], [address] [place], [place] [address]    Special Requests   Final    NONE Performed at [facility] [address] [place], [place] [address]    Gram Stain NO WBC SEEN NO ORGANISMS SEEN   Final   Culture   Final    NO GROWTH 4 DAYS NO ANAEROBES ISOLATED; CULTURE IN PROGRESS FOR 5 DAYS Performed at [facility], [address], [place], [place] [address]    Report Status PENDING  Incomplete  Culture, blood (Routine X 2) w Reflex to ID Panel     Status: None (Preliminary result)   Collection Time: 01/09/23  4:03 PM   Specimen: BLOOD  Result Value Ref Range Status   Specimen Description BLOOD BLOOD RIGHT HAND right wrist  Final   Special Requests   Final    BOTTLES DRAWN AEROBIC ONLY Blood Culture adequate volume   Culture   Final    NO GROWTH 3 DAYS Performed at [facility], [address] [place], [place] [address]    Report Status PENDING  Incomplete  Culture, blood (Routine X 2) w Reflex to ID Panel     Status: None (Preliminary result)   Collection Time: 01/09/23  4:06 PM   Specimen: BLOOD  Result Value Ref Range Status   Specimen Description BLOOD BLOOD RIGHT ARM RAC  Final   Special Requests   Final    BOTTLES DRAWN AEROBIC AND ANAEROBIC Blood Culture adequate volume   Culture   Final    NO GROWTH 3 DAYS Performed at [facility], [address] [place], [place] [address]    Report Status PENDING  Incomplete    Labs: CBC: Recent Labs  Lab 01/08/23 0440 01/09/23 0601 01/10/23 0604 01/11/23 0506 01/12/23 0518  WBC 7.9 5.1 4.9 5.2 4.8  HGB 9.2* 9.3* 8.9* 8.8* 9.0*  HCT 30.0* 29.3* 28.5* 29.1* 29.3*  MCV 88.8 87.2 87.7 89.0 88.3  PLT 440* 379 349 347 359   Basic Metabolic Panel: Recent Labs  Lab 01/08/23 0440 01/09/23 0601 01/10/23 0604 01/11/23 0506 01/12/23 0518  NA 138 138 138 138 140  K 4.0 3.7 3.6 3.9 3.8  CL 105 106 107 106 108   CO2 27 26 26 26 26   GLUCOSE 101* 90 89 88 89  BUN 18 13 12 11 10   CREATININE 0.88 0.91 0.82 0.90 0.83  CALCIUM 8.1* 8.0* 8.0* 8.1* 8.4*  MG  --  1.8 1.7  --   --    Liver Function Tests: Recent Labs  Lab 01/07/23 1541 01/08/23 0440  AST 75* 56*  ALT 42 36  ALKPHOS 62 58  BILITOT 0.4 0.5  PROT 5.3* 5.4*  ALBUMIN 2.1* 2.0*   CBG: Recent Labs  Lab 01/09/23 0822 01/09/23 1115 01/09/23 1629 01/09/23 2125 01/11/23 0904  GLUCAP 79 116* 122* 110* 87    Discharge time spent: greater than 30 minutes.  Signed: [name], DO [facility] 01/12/2023

## 2023-01-13 DIAGNOSIS — K750 Abscess of liver: Secondary | ICD-10-CM | POA: Diagnosis not present

## 2023-01-13 DIAGNOSIS — I2510 Atherosclerotic heart disease of native coronary artery without angina pectoris: Secondary | ICD-10-CM | POA: Diagnosis not present

## 2023-01-13 DIAGNOSIS — I700 Atherosclerosis of aorta: Secondary | ICD-10-CM | POA: Diagnosis not present

## 2023-01-13 DIAGNOSIS — I5042 Chronic combined systolic (congestive) and diastolic (congestive) heart failure: Secondary | ICD-10-CM | POA: Diagnosis not present

## 2023-01-13 DIAGNOSIS — I080 Rheumatic disorders of both mitral and aortic valves: Secondary | ICD-10-CM | POA: Diagnosis not present

## 2023-01-13 DIAGNOSIS — L89312 Pressure ulcer of right buttock, stage 2: Secondary | ICD-10-CM | POA: Diagnosis not present

## 2023-01-13 DIAGNOSIS — I77810 Thoracic aortic ectasia: Secondary | ICD-10-CM | POA: Diagnosis not present

## 2023-01-13 DIAGNOSIS — I480 Paroxysmal atrial fibrillation: Secondary | ICD-10-CM | POA: Diagnosis not present

## 2023-01-13 DIAGNOSIS — B9620 Unspecified Escherichia coli [E. coli] as the cause of diseases classified elsewhere: Secondary | ICD-10-CM | POA: Diagnosis not present

## 2023-01-14 LAB — CULTURE, BLOOD (ROUTINE X 2)
Culture: NO GROWTH
Special Requests: ADEQUATE

## 2023-01-15 ENCOUNTER — Telehealth: Payer: Self-pay | Admitting: *Deleted

## 2023-01-15 ENCOUNTER — Telehealth: Payer: Self-pay | Admitting: Family Medicine

## 2023-01-15 ENCOUNTER — Telehealth: Payer: Self-pay | Admitting: Pharmacist

## 2023-01-15 ENCOUNTER — Other Ambulatory Visit: Payer: Self-pay | Admitting: Internal Medicine

## 2023-01-15 DIAGNOSIS — K750 Abscess of liver: Secondary | ICD-10-CM

## 2023-01-15 DIAGNOSIS — I480 Paroxysmal atrial fibrillation: Secondary | ICD-10-CM | POA: Diagnosis not present

## 2023-01-15 DIAGNOSIS — I080 Rheumatic disorders of both mitral and aortic valves: Secondary | ICD-10-CM | POA: Diagnosis not present

## 2023-01-15 DIAGNOSIS — I5032 Chronic diastolic (congestive) heart failure: Secondary | ICD-10-CM

## 2023-01-15 DIAGNOSIS — I5042 Chronic combined systolic (congestive) and diastolic (congestive) heart failure: Secondary | ICD-10-CM | POA: Diagnosis not present

## 2023-01-15 DIAGNOSIS — B9620 Unspecified Escherichia coli [E. coli] as the cause of diseases classified elsewhere: Secondary | ICD-10-CM | POA: Diagnosis not present

## 2023-01-15 DIAGNOSIS — I77810 Thoracic aortic ectasia: Secondary | ICD-10-CM | POA: Diagnosis not present

## 2023-01-15 DIAGNOSIS — I2510 Atherosclerotic heart disease of native coronary artery without angina pectoris: Secondary | ICD-10-CM | POA: Diagnosis not present

## 2023-01-15 DIAGNOSIS — I700 Atherosclerosis of aorta: Secondary | ICD-10-CM | POA: Diagnosis not present

## 2023-01-15 DIAGNOSIS — L89312 Pressure ulcer of right buttock, stage 2: Secondary | ICD-10-CM | POA: Diagnosis not present

## 2023-01-15 DIAGNOSIS — E1169 Type 2 diabetes mellitus with other specified complication: Secondary | ICD-10-CM

## 2023-01-15 NOTE — Progress Notes (Signed)
  Care Coordination  Outreach Note  01/15/2023 Name: [name] Young. MRN: [id number] DOB: [birth year]   Care Coordination Outreach Attempts: An unsuccessful telephone outreach was attempted today to offer the patient information about available care coordination services.  Follow Up Plan:  Additional outreach attempts will be made to offer the patient care coordination information and services.   Encounter Outcome:  No Answer  Burman Nieves, CCMA Care Coordination Care Guide Direct Dial: [phone]

## 2023-01-15 NOTE — Progress Notes (Signed)
  Care Coordination   Note   01/15/2023 Name: [name] Young. MRN: [id number] DOB: October 26, 1940  [name]. is a 82 y.o. year old male who sees [name], MD for primary care. I reached out to [name] by phone today to offer care coordination services.  Mr. Fistler was given information about Care Coordination services today including:   The Care Coordination services include support from the care team which includes your Nurse Coordinator, Clinical Social Worker, or Pharmacist.  The Care Coordination team is here to help remove barriers to the health concerns and goals most important to you. Care Coordination services are voluntary, and the patient may decline or stop services at any time by request to their care team member.   Care Coordination Consent Status: Patient agreed to services and verbal consent obtained.   Follow up plan:  Telephone appointment with care coordination team member scheduled for:  01/19/2023  Encounter Outcome:  Pt. Scheduled from referral   Burman Nieves, [facility] Care Coordination Care Guide Direct Dial: [phone]

## 2023-01-15 NOTE — Telephone Encounter (Signed)
PharmD reviewed patient chart to assess eligibility for [facility] services. Patient was determined to be a good candidate for the program given the complexity of the medication regimen and overall risk for hospitalization and/or high healthcare utilization.   Referral entered in order to outreach patient and offer appointment with PharmD. Referral cosigned to PCP.  

## 2023-01-15 NOTE — Telephone Encounter (Signed)
Home Health verbal orders Caller Name: [employer] Agency Name: [facility]  Callback number: [phone]  Requesting Wound care  Reason:Wound care for stage 2 ulcer on right buttocks, JP drain management, IV antibiotic therapy, PICC dressing changes, and weekly labs.  Patient is not taking metformin.   Please forward to [facility] or providers CMA

## 2023-01-15 NOTE — Telephone Encounter (Signed)
Spoke with Lori informing her Dr. G is giving verbal orders for services requested.  

## 2023-01-15 NOTE — Telephone Encounter (Signed)
Agree with this. Thanks.  

## 2023-01-16 DIAGNOSIS — I10 Essential (primary) hypertension: Secondary | ICD-10-CM | POA: Diagnosis not present

## 2023-01-16 DIAGNOSIS — I080 Rheumatic disorders of both mitral and aortic valves: Secondary | ICD-10-CM | POA: Diagnosis not present

## 2023-01-16 DIAGNOSIS — I77810 Thoracic aortic ectasia: Secondary | ICD-10-CM | POA: Diagnosis not present

## 2023-01-16 DIAGNOSIS — I480 Paroxysmal atrial fibrillation: Secondary | ICD-10-CM | POA: Diagnosis not present

## 2023-01-16 DIAGNOSIS — B9620 Unspecified Escherichia coli [E. coli] as the cause of diseases classified elsewhere: Secondary | ICD-10-CM | POA: Diagnosis not present

## 2023-01-16 DIAGNOSIS — K750 Abscess of liver: Secondary | ICD-10-CM | POA: Diagnosis not present

## 2023-01-16 DIAGNOSIS — I5042 Chronic combined systolic (congestive) and diastolic (congestive) heart failure: Secondary | ICD-10-CM | POA: Diagnosis not present

## 2023-01-16 DIAGNOSIS — I700 Atherosclerosis of aorta: Secondary | ICD-10-CM | POA: Diagnosis not present

## 2023-01-16 DIAGNOSIS — I2510 Atherosclerotic heart disease of native coronary artery without angina pectoris: Secondary | ICD-10-CM | POA: Diagnosis not present

## 2023-01-16 DIAGNOSIS — L89312 Pressure ulcer of right buttock, stage 2: Secondary | ICD-10-CM | POA: Diagnosis not present

## 2023-01-18 DIAGNOSIS — I5042 Chronic combined systolic (congestive) and diastolic (congestive) heart failure: Secondary | ICD-10-CM | POA: Diagnosis not present

## 2023-01-18 DIAGNOSIS — I77810 Thoracic aortic ectasia: Secondary | ICD-10-CM | POA: Diagnosis not present

## 2023-01-18 DIAGNOSIS — I2510 Atherosclerotic heart disease of native coronary artery without angina pectoris: Secondary | ICD-10-CM | POA: Diagnosis not present

## 2023-01-18 DIAGNOSIS — B9620 Unspecified Escherichia coli [E. coli] as the cause of diseases classified elsewhere: Secondary | ICD-10-CM | POA: Diagnosis not present

## 2023-01-18 DIAGNOSIS — I700 Atherosclerosis of aorta: Secondary | ICD-10-CM | POA: Diagnosis not present

## 2023-01-18 DIAGNOSIS — L89312 Pressure ulcer of right buttock, stage 2: Secondary | ICD-10-CM | POA: Diagnosis not present

## 2023-01-18 DIAGNOSIS — I480 Paroxysmal atrial fibrillation: Secondary | ICD-10-CM | POA: Diagnosis not present

## 2023-01-18 DIAGNOSIS — I080 Rheumatic disorders of both mitral and aortic valves: Secondary | ICD-10-CM | POA: Diagnosis not present

## 2023-01-18 DIAGNOSIS — K750 Abscess of liver: Secondary | ICD-10-CM | POA: Diagnosis not present

## 2023-01-19 ENCOUNTER — Ambulatory Visit: Payer: Self-pay

## 2023-01-19 DIAGNOSIS — K219 Gastro-esophageal reflux disease without esophagitis: Secondary | ICD-10-CM

## 2023-01-19 DIAGNOSIS — E669 Obesity, unspecified: Secondary | ICD-10-CM

## 2023-01-19 DIAGNOSIS — I5042 Chronic combined systolic (congestive) and diastolic (congestive) heart failure: Secondary | ICD-10-CM | POA: Diagnosis not present

## 2023-01-19 DIAGNOSIS — E785 Hyperlipidemia, unspecified: Secondary | ICD-10-CM

## 2023-01-19 DIAGNOSIS — Z452 Encounter for adjustment and management of vascular access device: Secondary | ICD-10-CM

## 2023-01-19 DIAGNOSIS — I080 Rheumatic disorders of both mitral and aortic valves: Secondary | ICD-10-CM | POA: Diagnosis not present

## 2023-01-19 DIAGNOSIS — E1122 Type 2 diabetes mellitus with diabetic chronic kidney disease: Secondary | ICD-10-CM

## 2023-01-19 DIAGNOSIS — I480 Paroxysmal atrial fibrillation: Secondary | ICD-10-CM | POA: Diagnosis not present

## 2023-01-19 DIAGNOSIS — Z951 Presence of aortocoronary bypass graft: Secondary | ICD-10-CM

## 2023-01-19 DIAGNOSIS — F40240 Claustrophobia: Secondary | ICD-10-CM

## 2023-01-19 DIAGNOSIS — L89312 Pressure ulcer of right buttock, stage 2: Secondary | ICD-10-CM | POA: Diagnosis not present

## 2023-01-19 DIAGNOSIS — Z87442 Personal history of urinary calculi: Secondary | ICD-10-CM

## 2023-01-19 DIAGNOSIS — I6521 Occlusion and stenosis of right carotid artery: Secondary | ICD-10-CM

## 2023-01-19 DIAGNOSIS — N200 Calculus of kidney: Secondary | ICD-10-CM

## 2023-01-19 DIAGNOSIS — Z86010 Personal history of colonic polyps: Secondary | ICD-10-CM

## 2023-01-19 DIAGNOSIS — K750 Abscess of liver: Secondary | ICD-10-CM | POA: Diagnosis not present

## 2023-01-19 DIAGNOSIS — Z9181 History of falling: Secondary | ICD-10-CM

## 2023-01-19 DIAGNOSIS — Z87891 Personal history of nicotine dependence: Secondary | ICD-10-CM

## 2023-01-19 DIAGNOSIS — K5730 Diverticulosis of large intestine without perforation or abscess without bleeding: Secondary | ICD-10-CM

## 2023-01-19 DIAGNOSIS — I429 Cardiomyopathy, unspecified: Secondary | ICD-10-CM

## 2023-01-19 DIAGNOSIS — Z8701 Personal history of pneumonia (recurrent): Secondary | ICD-10-CM

## 2023-01-19 DIAGNOSIS — Z792 Long term (current) use of antibiotics: Secondary | ICD-10-CM

## 2023-01-19 DIAGNOSIS — I2510 Atherosclerotic heart disease of native coronary artery without angina pectoris: Secondary | ICD-10-CM | POA: Diagnosis not present

## 2023-01-19 DIAGNOSIS — N400 Enlarged prostate without lower urinary tract symptoms: Secondary | ICD-10-CM

## 2023-01-19 DIAGNOSIS — M1990 Unspecified osteoarthritis, unspecified site: Secondary | ICD-10-CM

## 2023-01-19 DIAGNOSIS — D631 Anemia in chronic kidney disease: Secondary | ICD-10-CM

## 2023-01-19 DIAGNOSIS — Z8719 Personal history of other diseases of the digestive system: Secondary | ICD-10-CM

## 2023-01-19 DIAGNOSIS — Z7901 Long term (current) use of anticoagulants: Secondary | ICD-10-CM

## 2023-01-19 DIAGNOSIS — N183 Chronic kidney disease, stage 3 unspecified: Secondary | ICD-10-CM

## 2023-01-19 DIAGNOSIS — I700 Atherosclerosis of aorta: Secondary | ICD-10-CM | POA: Diagnosis not present

## 2023-01-19 DIAGNOSIS — I77810 Thoracic aortic ectasia: Secondary | ICD-10-CM | POA: Diagnosis not present

## 2023-01-19 DIAGNOSIS — B9620 Unspecified Escherichia coli [E. coli] as the cause of diseases classified elsewhere: Secondary | ICD-10-CM | POA: Diagnosis not present

## 2023-01-19 DIAGNOSIS — R011 Cardiac murmur, unspecified: Secondary | ICD-10-CM

## 2023-01-19 DIAGNOSIS — Z6831 Body mass index (BMI) 31.0-31.9, adult: Secondary | ICD-10-CM

## 2023-01-19 DIAGNOSIS — Z4803 Encounter for change or removal of drains: Secondary | ICD-10-CM

## 2023-01-19 NOTE — Patient Outreach (Signed)
  Care Coordination   Initial Visit Note   01/19/2023 Name: [name] Young. MRN: [id number] DOB: [birth year]  [name] Young. is a 82 y.o. year old male who sees [name], MD for primary care. I  spoke with patients wife / dpr Lotanna Piscitelli  What matters to the patients health and wellness today?  Wife states patient was discharged on 01/12/23 due to liver abscess. She states patient was discharged home with a drain and is receiving IV antibiotics.  Wife states patient is receiving nursing and therapy services with [facility] health. Wife denies any signs/ symptoms of infection at PICC line site or drain sight. Wife states patient has not been scheduled for hospital follow up with PCP as of yet.  Wife states patient is still very weak. She reports he ambulates with a cane. She reports patients blood pressures are ranging in the 130's/60's.  Wife notified of patients post hospital follow up appointment for 01/22/23 at 3:00pm.     Goals Addressed             This Visit's Progress    Post hospital follow up / management of hepatic abcess       Interventions Today    Flowsheet Row Most Recent Value  Chronic Disease   Chronic disease during today's visit Other  [hepatic abscess]  General Interventions   General Interventions Discussed/Reviewed General Interventions Discussed, Doctor Visits  [evaluation of current treatment plan for hepatic abscess and patients adherence to plan as established by provider. Confirmed home health services started. Assessed for signs/ of infection and / or new ongoing symptoms.]  Doctor Visits Discussed/Reviewed Doctor Visits Discussed  [reviewed upcoming provider visit. discussed need for post hospital follow up visit.  Call to [facility] to scheduled hospital follow up visit with PCP.]  Education Interventions   Education Provided Provided Education  [reviewed signs / symptoms of infection. Advised to continue to monitor weight daily / BP and  record.]  Pharmacy Interventions   Pharmacy Dicussed/Reviewed Pharmacy Topics Discussed  [medications reviewed and compliance discussed. Confirmed patient has medications prescribed. Confirmed with spouse medications held as per provider recommendation.]              SDOH assessments and interventions completed:  Yes  SDOH Interventions Today    Flowsheet Row Most Recent Value  SDOH Interventions   Food Insecurity Interventions Intervention Not Indicated  Housing Interventions Intervention Not Indicated  Transportation Interventions Intervention Not Indicated        Care Coordination Interventions:  Yes, provided   Follow up plan: Follow up call scheduled for 02/13/23    Encounter Outcome:  Pt. Visit Completed   George Ina RN,BSN,CCM [facility] Care Coordination [phone] direct line

## 2023-01-19 NOTE — Patient Instructions (Signed)
Visit Information  Thank you for taking time to visit with me today. Please don't hesitate to contact me if I can be of assistance to you.   Following are the goals we discussed today:  Reminder: Hospital follow up visit with primary care provider 01/22/23 at 3:00 pm Continue to weight and monitor blood pressure daily.  Notify provider for any concerns. Monitor PICC line and abdominal drain site for signs of infection. Reports these symptoms to doctor as soon as possible.  Continue to take medications as prescribed.  Keep follow up appointments with provider.    Our next appointment is by telephone on 02/13/23 at 2:30 pm  Please call the care guide team at [phone] if you need to cancel or reschedule your appointment.   If you are experiencing a Mental Health or Behavioral Health Crisis or need someone to talk to, please call the Suicide and Crisis Lifeline: 988 call 1-800-273-TALK (toll free, 24 hour hotline)  Patient verbalizes understanding of instructions and care plan provided today and agrees to view in MyChart. Active MyChart status and patient understanding of how to access instructions and care plan via MyChart confirmed with patient.     George Ina RN,BSN,CCM [facility] Care Coordination [phone] direct line

## 2023-01-20 DIAGNOSIS — K750 Abscess of liver: Secondary | ICD-10-CM | POA: Diagnosis not present

## 2023-01-21 ENCOUNTER — Emergency Department
Admission: EM | Admit: 2023-01-21 | Discharge: 2023-01-21 | Disposition: A | Discharge status: Home / Self Care | Payer: Medicare PPO | Attending: Emergency Medicine | Admitting: Emergency Medicine

## 2023-01-21 ENCOUNTER — Emergency Department: Payer: Medicare PPO

## 2023-01-21 ENCOUNTER — Other Ambulatory Visit: Payer: Self-pay

## 2023-01-21 DIAGNOSIS — R1031 Right lower quadrant pain: Secondary | ICD-10-CM | POA: Diagnosis not present

## 2023-01-21 DIAGNOSIS — N281 Cyst of kidney, acquired: Secondary | ICD-10-CM | POA: Diagnosis not present

## 2023-01-21 DIAGNOSIS — J90 Pleural effusion, not elsewhere classified: Secondary | ICD-10-CM | POA: Insufficient documentation

## 2023-01-21 DIAGNOSIS — K750 Abscess of liver: Secondary | ICD-10-CM | POA: Diagnosis not present

## 2023-01-21 DIAGNOSIS — N200 Calculus of kidney: Secondary | ICD-10-CM | POA: Diagnosis not present

## 2023-01-21 DIAGNOSIS — I2510 Atherosclerotic heart disease of native coronary artery without angina pectoris: Secondary | ICD-10-CM | POA: Diagnosis not present

## 2023-01-21 DIAGNOSIS — K5730 Diverticulosis of large intestine without perforation or abscess without bleeding: Secondary | ICD-10-CM | POA: Diagnosis not present

## 2023-01-21 LAB — CBC WITH DIFFERENTIAL/PLATELET
Abs Immature Granulocytes: 0.01 10*3/uL (ref 0.00–0.07)
Basophils Absolute: 0 10*3/uL (ref 0.0–0.1)
Basophils Relative: 1 %
Eosinophils Absolute: 0.3 10*3/uL (ref 0.0–0.5)
Eosinophils Relative: 7 %
HCT: 38.6 % — ABNORMAL LOW (ref 39.0–52.0)
Hemoglobin: 11.6 g/dL — ABNORMAL LOW (ref 13.0–17.0)
Immature Granulocytes: 0 %
Lymphocytes Relative: 36 %
Lymphs Abs: 1.7 10*3/uL (ref 0.7–4.0)
MCH: 27.6 pg (ref 26.0–34.0)
MCHC: 30.1 g/dL (ref 30.0–36.0)
MCV: 91.9 fL (ref 80.0–100.0)
Monocytes Absolute: 0.3 10*3/uL (ref 0.1–1.0)
Monocytes Relative: 5 %
Neutro Abs: 2.4 10*3/uL (ref 1.7–7.7)
Neutrophils Relative %: 51 %
Platelets: 261 10*3/uL (ref 150–400)
RBC: 4.2 MIL/uL — ABNORMAL LOW (ref 4.22–5.81)
RDW: 17.3 % — ABNORMAL HIGH (ref 11.5–15.5)
WBC: 4.7 10*3/uL (ref 4.0–10.5)
nRBC: 0 % (ref 0.0–0.2)

## 2023-01-21 LAB — COMPREHENSIVE METABOLIC PANEL
ALT: 14 U/L (ref 0–44)
AST: 28 U/L (ref 15–41)
Albumin: 2.7 g/dL — ABNORMAL LOW (ref 3.5–5.0)
Alkaline Phosphatase: 53 U/L (ref 38–126)
Anion gap: 8 (ref 5–15)
BUN: 10 mg/dL (ref 8–23)
CO2: 23 mmol/L (ref 22–32)
Calcium: 8.5 mg/dL — ABNORMAL LOW (ref 8.9–10.3)
Chloride: 106 mmol/L (ref 98–111)
Creatinine, Ser: 0.82 mg/dL (ref 0.61–1.24)
GFR, Estimated: >60 mL/min (ref >60)
Glucose, Bld: 159 mg/dL — ABNORMAL HIGH (ref 70–99)
Potassium: 3.9 mmol/L (ref 3.5–5.1)
Sodium: 137 mmol/L (ref 135–145)
Total Bilirubin: 0.3 mg/dL (ref 0.3–1.2)
Total Protein: 6.2 g/dL — ABNORMAL LOW (ref 6.5–8.1)

## 2023-01-21 MED ORDER — IOHEXOL 300 MG/ML  SOLN
100.0000 mL | Freq: Once | INTRAMUSCULAR | Status: AC | PRN
  Administered 2023-01-21: 100 mL via INTRAVENOUS

## 2023-01-21 NOTE — ED Triage Notes (Signed)
Pt states coming in with fluid to the right flank. Pt states he has a drain to the right abdomen to drain fluid from the liver. Drain was placed 4/18

## 2023-01-21 NOTE — ED Provider Notes (Signed)
[facility] Provider Note    Event Date/Time   First MD Initiated Contact with Patient 01/21/23 1027     (approximate)   History   Chief Complaint Abdominal Pain (Right)   HPI  [name] Young. is a 82 y.o. male with past medical history of CAD status post CABG, atrial fibrillation, ascending cholangitis, and liver abscess status post drain placement who presents to the ED complaining of abdominal pain.  Patient reports that he has been dealing with increasing pain and swelling to the right side of his abdomen since last night.  He has been admitted to the hospital on 2 separate occasions for liver abscesses secondary to a ascending cholangitis, currently has a PICC line in place and has been receiving IV Rocephin along with oral Flagyl.  He states that the right lower portion of his abdomen as well as his right flank seems to be swollen and sore to touch.  He denies any injuries to these areas and has not had any fevers, nausea, vomiting, or changes in his bowel movements.  Wife states that the previously placed drain seems to be functioning normally, she flexes it each night and has noted about 3 to 4 cc of drainage daily.  Wife reports patient has been receiving antibiotics via PICC line without difficulty.     Physical Exam   Triage Vital Signs: ED Triage Vitals  Enc Vitals Group     BP 01/21/23 0859 (!) 149/84     Pulse Rate 01/21/23 0859 87     Resp 01/21/23 0859 18     Temp 01/21/23 0859 97.8 F (36.6 C)     Temp src --      SpO2 01/21/23 0859 99 %     Weight 01/21/23 0857 217 lb (98.4 kg)     Height 01/21/23 0857 5\' 10"  (1.778 m)     Head Circumference --      Peak Flow --      Pain Score 01/21/23 0857 1     Pain Loc --      Pain Edu? --      Excl. in GC? --     Most recent vital signs: Vitals:   01/21/23 0859  BP: (!) 149/84  Pulse: 87  Resp: 18  Temp: 97.8 F (36.6 C)  SpO2: 99%    Constitutional: Alert and oriented. Eyes:  Conjunctivae are normal. Head: Atraumatic. Nose: No congestion/rhinnorhea. Mouth/Throat: Mucous membranes are moist.  Cardiovascular: Normal rate, regular rhythm. Grossly normal heart sounds.  2+ radial pulses bilaterally. Respiratory: Normal respiratory effort.  No retractions. Lungs CTAB. Gastrointestinal: Soft and tender to palpation in the right lower quadrant with what appears to be soft tissue edema, no overlying erythema or warmth noted. No distention.  Right upper quadrant drain site intact with no surrounding erythema, warmth, or drainage. Musculoskeletal: No lower extremity tenderness nor edema.  Neurologic:  Normal speech and language. No gross focal neurologic deficits are appreciated.    ED Results / Procedures / Treatments   Labs (all labs ordered are listed, but only abnormal results are displayed) Labs Reviewed  CBC WITH DIFFERENTIAL/PLATELET - Abnormal; Notable for the following components:      Result Value   RBC 4.20 (*)    Hemoglobin 11.6 (*)    HCT 38.6 (*)    RDW 17.3 (*)    All other components within normal limits  COMPREHENSIVE METABOLIC PANEL - Abnormal; Notable for the following components:   Glucose,  Bld 159 (*)    Calcium 8.5 (*)    Total Protein 6.2 (*)    Albumin 2.7 (*)    All other components within normal limits   RADIOLOGY CT abdomen/pelvis reviewed and interpreted by me with appropriately positioned drainage catheter, inflammatory changes noted in the subcutaneous fat of the right lower quadrant, small to moderate pleural effusion noted.  PROCEDURES:  Critical Care performed: No  Procedures   MEDICATIONS ORDERED IN ED: Medications  iohexol (OMNIPAQUE) 300 MG/ML solution 100 mL (100 mLs Intravenous Contrast Given 01/21/23 1103)     IMPRESSION / MDM / ASSESSMENT AND PLAN / ED COURSE  I reviewed the triage vital signs and the nursing notes.                              82 y.o. male with past medical history of CAD status post CABG,  atrial fibrillation, ascending cholangitis, and liver abscesses status post drain placement who presents to the ED with swelling and increased pain along the right lower quadrant of his abdomen since last night.  Patient's presentation is most consistent with acute presentation with potential threat to life or bodily function.  Differential diagnosis includes, but is not limited to, recurrent abscesses, misplaced drain, appendicitis, hematoma, subcutaneous edema, UTI, kidney stone.  Patient nontoxic-appearing and in no acute distress, vital signs are unremarkable.  He has some tenderness to the right lower quadrant of his abdomen, where there appears to be some soft tissue edema with no evidence of infection here or at his drain site.  We will further assess with CT imaging for intra-abdominal process, labs thus far are reassuring with no significant anemia, leukocytosis, tract abnormality, or AKI.  LFTs are also unremarkable.  Patient declines pain or nausea medication, will reassess following imaging.  CT imaging shows appropriately positioned drainage catheter with decreased size of primary liver abscess, additional abscess that was drained by IR has almost completely resolved.  Patient does have edema in the subcutaneous fat of his right lower quadrant that correlates to area of swelling and discomfort.  Etiology of this is unclear, but does not appear concerning for cellulitis or abscess.  Patient also noted to have small to moderate right-sided pleural effusion, slightly increased in size from previous.  He currently denies any respiratory symptoms and no findings concerning for pneumonia.  He has previously required thoracentesis for drainage of similar fluid during admission to the hospital, however effusion does not appear large enough to require thoracentesis at this time.  This will need to be monitored for any signs of worsening, at which point he would require thoracentesis.  Patient was offered  admission to the hospital for observation of this as well as subcutaneous edema, but declines and prefers to follow-up as an outpatient.  This seems reasonable as he has follow-up scheduled with his PCP tomorrow and with infectious disease later this week.  Do not feel change in antibiotics needed at this time and patient was given close return precautions.  Patient and spouse agree with plan.      FINAL CLINICAL IMPRESSION(S) / ED DIAGNOSES   Final diagnoses:  Right lower quadrant abdominal pain  Pleural effusion  Liver abscess     Rx / DC Orders   ED Discharge Orders     None        Note:  This document was prepared using Dragon voice recognition software and may include unintentional dictation errors.  Chesley Noon, MD 01/21/23 1241

## 2023-01-22 ENCOUNTER — Ambulatory Visit: Payer: Medicare PPO | Admitting: Family Medicine

## 2023-01-22 ENCOUNTER — Telehealth: Payer: Self-pay | Admitting: Family Medicine

## 2023-01-22 ENCOUNTER — Encounter: Payer: Self-pay | Admitting: Family Medicine

## 2023-01-22 ENCOUNTER — Encounter: Payer: Medicare PPO | Admitting: Pharmacist

## 2023-01-22 VITALS — BP 140/74 | HR 75 | Temp 97.1°F | Ht 70.0 in | Wt 221.4 lb

## 2023-01-22 DIAGNOSIS — I5032 Chronic diastolic (congestive) heart failure: Secondary | ICD-10-CM

## 2023-01-22 DIAGNOSIS — E43 Unspecified severe protein-calorie malnutrition: Secondary | ICD-10-CM

## 2023-01-22 DIAGNOSIS — L89311 Pressure ulcer of right buttock, stage 1: Secondary | ICD-10-CM | POA: Diagnosis not present

## 2023-01-22 DIAGNOSIS — I480 Paroxysmal atrial fibrillation: Secondary | ICD-10-CM | POA: Diagnosis not present

## 2023-01-22 DIAGNOSIS — K750 Abscess of liver: Secondary | ICD-10-CM | POA: Diagnosis not present

## 2023-01-22 DIAGNOSIS — J90 Pleural effusion, not elsewhere classified: Secondary | ICD-10-CM | POA: Diagnosis not present

## 2023-01-22 DIAGNOSIS — E1169 Type 2 diabetes mellitus with other specified complication: Secondary | ICD-10-CM

## 2023-01-22 DIAGNOSIS — K219 Gastro-esophageal reflux disease without esophagitis: Secondary | ICD-10-CM | POA: Diagnosis not present

## 2023-01-22 DIAGNOSIS — R627 Adult failure to thrive: Secondary | ICD-10-CM

## 2023-01-22 MED ORDER — FUROSEMIDE 40 MG PO TABS
20.0000 mg | ORAL_TABLET | Freq: Every day | ORAL | 3 refills | Status: DC
Start: 2023-01-22 — End: 2023-07-16

## 2023-01-22 MED ORDER — MIRTAZAPINE 15 MG PO TABS: 15.0000 mg | ORAL_TABLET | Freq: Every day | ORAL | 3 refills | Status: DC

## 2023-01-22 MED ORDER — METOPROLOL SUCCINATE ER 25 MG PO TB24: 25.0000 mg | ORAL_TABLET | Freq: Every day | ORAL | 1 refills | Status: DC

## 2023-01-22 NOTE — Telephone Encounter (Signed)
Patient wife called in and wanted to cancel the appointment he has scheduled today. Thank you!

## 2023-01-22 NOTE — Patient Instructions (Addendum)
For sleep and appetite take remeron (mirtazapine) 15mg  nightly.  Continue ensure 2-3 per day.  Restart lasix 40mg  1/2 tablet daily in the morning, if not increasing urination may increase to whole tablet.  Restart Toprol XL (metoprolol )1/2 tablet of 50mg  tablet (25mg  dose) Stay off farxiga. Talk with Dr Okey Dupre about Sherryll Burger.  Good to see you today.  Return in 6 weeks for follow up visit.

## 2023-01-22 NOTE — Progress Notes (Unsigned)
Ph: [phone] Fax: [phone]   Patient ID: [name] Young., male    DOB: 1941-02-08, 82 y.o.   MRN: [id number]  This visit was conducted in person.  BP (!) 140/74 (Cuff Size: Large)   Pulse 75   Temp (!) 97.1 F (36.2 C) (Temporal)   Ht 5\' 10"  (1.778 m)   Wt 221 lb 6 oz (100.4 kg)   SpO2 99%   BMI 31.76 kg/m    CC: hosp f/u visit  Subjective:   HPI: [name] Young. is a 82 y.o. male presenting on 01/22/2023 for Hospitalization Follow-up (Admitted on 01/07/23 at [facility], dx perihepatic abscess; pleural effusion. Then seen on 01/21/23 at [facility] ED, dx RLE abd pain; pleural effusion; liver abscess. Pt accompanied by wife, [name] Young. )   Recent hospitalization x2 for failure to thrive associated with recurrent right pleural effusions found to have liver abscess s/p drain placement by IR. Initial abscess culture grew abundant pan-sensitive E coli.   Subsequent hospitalization for worsening R sided abd pain and swelling with rash. Imaging with concern for reforming liver abscess, IR repositioned drain and performed CT-guided aspiration of new abscess.   Hospital records reviewed. Med rec performed. Final blood cultures NG x2, abscess culture grew pan-sensitive E coli.  [name] Young, Lasix all held due to low BP.  Decubitus ulcers were monitored - R buttock stage 2.   Continues IV ceftriaxone through 02/05/2023, as well as oral flagyl.   Continues ceftriaxone via PICC.   Subsequently seen at ER yesterday with worsening RUQ abd pain - imaging and labs largely reassuring. Flushing drain once daily  He continues feeling tired. Ambulates with cane today.   Since home, staying very tired and fatigued, easy exertional dyspnea. Notes decreased appetite - no taste for meats. Drinking 2 ensure a day.  No fevers/chills, nausea/vomiting, diarrhea. No chest pain, dizziness. No GERD or heartburn.  Notes Farxiga causes recurrent yeast infection.   Home health set up - [facility]  wound care as well as PT.  Other follow up appointments scheduled: IR tomorrow, ID 01/25/2023.  ______________________________________________________________________ Initial hospitalization: Admission: 12/28/2022 Discharge: 01/05/2023 Transitional care phone call not performed Discharged to SNF  Recommendations at discharge:   Follow-up team at rehab Follow-up with interventional radiology team for drain. Follow-up Dr End cardiology 2 weeks Follow-up Dr. Joylene Draft infectious disease 2 weeks.   Discharge Diagnoses: Principal Problem:   Hepatic abscess Active Problems:   Pleural effusion, right   Protein-calorie malnutrition, severe (HCC)   FTT (failure to thrive) in adult   GERD (gastroesophageal reflux disease)   Benign prostatic hyperplasia   Obesity (BMI 30-39.9)   DNR (do not resuscitate)   S/P CABG x 3   Paroxysmal atrial fibrillation (HCC)   History of endocarditis   History of bacteremia   Reactive thrombocytosis   Metabolic acidosis   Moderate aortic stenosis   Chronic diastolic CHF (congestive heart failure) (HCC)   Decubitus ulcer of right buttock, stage 2 (HCC)   Impaired fasting glucose   Subsequent hospitalization:  Hospital admission: 01/07/2023 Hospital discharge: 01/12/2023 TCM f/u phone call: not performed  Discharged HOME with [facility] PT/OT/RN for IV abx.   Recommendations at discharge:  Follow up next week with Interventional Radiology, as scheduled Monitor drain output.  Flush drain twice daily. Follow up with Primary Care in 1 week Weekly labs -- CBC w diff, CMP -- while on antibiotics.  Fax results to [phone] Follow up with Cardiology as scheduled Monitor BP & resume  [name] Young and Lasix when BP will tolerate (these are held to avoid hypotension currently)   Discharge Diagnoses: Principal Problem:   Abdominal pain Active Problems:   Hepatic abscess   Type 2 diabetes mellitus with other specified complication (HCC)   GERD  (gastroesophageal reflux disease)   CKD stage 3 due to type 2 diabetes mellitus (HCC)   Advanced care planning/counseling discussion   Coronary artery disease   Anemia   Paroxysmal atrial fibrillation (HCC)   Recurrent right pleural effusion   Chronic diastolic CHF (congestive heart failure) (HCC)   Decubitus ulcer of right buttock, stage 2 (HCC)   Medication management   Resolved Problems:   Bacteremia due to Escherichia coli     Relevant past medical, surgical, family and social history reviewed and updated as indicated. Interim medical history since our last visit reviewed. Allergies and medications reviewed and updated. Outpatient Medications Prior to Visit  Medication Sig Dispense Refill   acetaminophen (TYLENOL) 325 MG tablet Take 1 tablet (325 mg total) by mouth at bedtime as needed (sleep aid).     apixaban (ELIQUIS) 5 MG TABS tablet TAKE 1 TABLET BY MOUTH TWICE A DAY 60 tablet 5   cefTRIAXone (ROCEPHIN) IVPB Inject 2 g into the vein daily for 23 days. Indication:  E Coli liver abscess First Dose: Yes Last Day of Therapy:  02/05/2023 Labs - Once weekly:  CBC/D and CMP, Please pull PIC at completion of IV antibiotics Fax weekly lab results  promptly to [phone] Method of administration: IV Push Method of administration may be changed at the discretion of home infusion pharmacist based upon assessment of the patient and/or caregiver's ability to self-administer the medication ordered. 24 Units 0   Cholecalciferol (VITAMIN D3) 25 MCG (1000 UT) CAPS Take 1 capsule (1,000 Units total) by mouth daily. 30 capsule    Cyanocobalamin (B-12) 1000 MCG CAPS Take by mouth daily at 2 am.     feeding supplement (ENSURE ENLIVE / ENSURE PLUS) LIQD Take 237 mLs by mouth 2 (two) times daily between meals. 14220 mL 0   liver oil-zinc oxide (DESITIN) 40 % ointment Apply topically 3 (three) times daily. 56.7 g 0   metroNIDAZOLE (FLAGYL) 500 MG tablet Take 1 tablet (500 mg total) by mouth every  12 (twelve) hours for 51 doses. 51 tablet 0   Multiple Vitamins-Minerals (MACULAR VITAMIN BENEFIT PO) Take 1 tablet by mouth daily.     nitroGLYCERIN (NITROSTAT) 0.4 MG SL tablet Place 1 tablet (0.4 mg total) under the tongue every 5 (five) minutes as needed for chest pain. 25 tablet 4   oxyCODONE-acetaminophen (PERCOCET/ROXICET) 5-325 MG tablet Take 1 tablet by mouth every 6 (six) hours as needed for moderate pain. 30 tablet 0   rosuvastatin (CRESTOR) 10 MG tablet TAKE 1 TABLET BY MOUTH EVERY DAY 90 tablet 3   sodium chloride flush (NS) 0.9 % SOLN Inject 3 mLs into the vein every 12 (twelve) hours. 60 mL 0   tamsulosin (FLOMAX) 0.4 MG CAPS capsule TAKE 1 CAPSULE BY MOUTH EVERY DAY 90 capsule 3   diphenhydrAMINE (BENADRYL) 25 mg capsule Take 1 capsule (25 mg total) by mouth at bedtime as needed for sleep. 30 capsule 0   diphenhydramine-acetaminophen (TYLENOL PM) 25-500 MG TABS tablet Take 2 tablets by mouth at bedtime as needed. Takes at  bedtime.     fenofibrate (TRICOR) 145 MG tablet Take 145 mg by mouth daily.     metFORMIN (GLUCOPHAGE) 500 MG tablet Take 500 mg by  mouth daily with breakfast.     mirtazapine (REMERON) 7.5 MG tablet Take 1 tablet (7.5 mg total) by mouth at bedtime. 30 tablet 0   pantoprazole (PROTONIX) 40 MG tablet Take 1 tablet (40 mg total) by mouth daily. (Patient not taking: Reported on 01/19/2023) 30 tablet 0   senna-docusate (SENOKOT-S) 8.6-50 MG tablet Take 1 tablet by mouth at bedtime as needed for mild constipation. (Patient not taking: Reported on 01/22/2023) 30 tablet 0   No facility-administered medications prior to visit.     Per HPI unless specifically indicated in ROS section below Review of Systems  Objective:  BP (!) 140/74 (Cuff Size: Large)   Pulse 75   Temp (!) 97.1 F (36.2 C) (Temporal)   Ht 5\' 10"  (1.778 m)   Wt 221 lb 6 oz (100.4 kg)   SpO2 99%   BMI 31.76 kg/m   Wt Readings from Last 3 Encounters:  01/22/23 221 lb 6 oz (100.4 kg)  01/21/23  217 lb (98.4 kg)  01/11/23 220 lb 0.3 oz (99.8 kg)      Physical Exam Vitals and nursing note reviewed.  Constitutional:      Appearance: Normal appearance. He is not ill-appearing.     Comments: Frail appearing, ambulates short distances with cane  HENT:     Head: Normocephalic and atraumatic.     Mouth/Throat:     Mouth: Mucous membranes are moist.     Pharynx: Oropharynx is clear. No oropharyngeal exudate or posterior oropharyngeal erythema.  Eyes:     Extraocular Movements: Extraocular movements intact.     Pupils: Pupils are equal, round, and reactive to light.  Cardiovascular:     Rate and Rhythm: Normal rate and regular rhythm.     Pulses: Normal pulses.     Heart sounds: Normal heart sounds. No murmur heard. Pulmonary:     Effort: Pulmonary effort is normal. No respiratory distress.     Breath sounds: No wheezing, rhonchi or rales.     Comments: Diminished breath sounds to RLL Abdominal:     General: Bowel sounds are normal. There is no distension.     Palpations: Abdomen is soft. There is no mass.     Tenderness: There is no abdominal tenderness. There is no guarding or rebound.     Hernia: No hernia is present.     Comments:  Swelling to R flank Drain in place to R lateral abdomen without erythema or drainage, dressing c/d/i  Musculoskeletal:     Right lower leg: No edema.     Left lower leg: No edema.  Skin:    General: Skin is warm and dry.     Coloration: Skin is pale.     Findings: Wound present. No erythema or rash.     Comments: Decub ulcer to sacral region only evident as erythema to area with tiny ulceration to right of midline  Neurological:     Mental Status: He is alert.  Psychiatric:        Mood and Affect: Mood normal.        Behavior: Behavior normal.       Results for orders placed or performed during the hospital encounter of 01/21/23  CBC with Differential  Result Value Ref Range   WBC 4.7 4.0 - 10.5 K/uL   RBC 4.20 (L) 4.22 - 5.81  MIL/uL   Hemoglobin 11.6 (L) 13.0 - 17.0 g/dL   HCT 16.1 (L) 09.6 - 04.5 %   MCV 91.9 80.0 -  100.0 fL   MCH 27.6 26.0 - 34.0 pg   MCHC 30.1 30.0 - 36.0 g/dL   RDW 47.8 (H) 29.5 - 62.1 %   Platelets 261 150 - 400 K/uL   nRBC 0.0 0.0 - 0.2 %   Neutrophils Relative % 51 %   Neutro Abs 2.4 1.7 - 7.7 K/uL   Lymphocytes Relative 36 %   Lymphs Abs 1.7 0.7 - 4.0 K/uL   Monocytes Relative 5 %   Monocytes Absolute 0.3 0.1 - 1.0 K/uL   Eosinophils Relative 7 %   Eosinophils Absolute 0.3 0.0 - 0.5 K/uL   Basophils Relative 1 %   Basophils Absolute 0.0 0.0 - 0.1 K/uL   Immature Granulocytes 0 %   Abs Immature Granulocytes 0.01 0.00 - 0.07 K/uL  Comprehensive metabolic panel  Result Value Ref Range   Sodium 137 135 - 145 mmol/L   Potassium 3.9 3.5 - 5.1 mmol/L   Chloride 106 98 - 111 mmol/L   CO2 23 22 - 32 mmol/L   Glucose, Bld 159 (H) 70 - 99 mg/dL   BUN 10 8 - 23 mg/dL   Creatinine, Ser 3.08 0.61 - 1.24 mg/dL   Calcium 8.5 (L) 8.9 - 10.3 mg/dL   Total Protein 6.2 (L) 6.5 - 8.1 g/dL   Albumin 2.7 (L) 3.5 - 5.0 g/dL   AST 28 15 - 41 U/L   ALT 14 0 - 44 U/L   Alkaline Phosphatase 53 38 - 126 U/L   Total Bilirubin 0.3 0.3 - 1.2 mg/dL   GFR, Estimated >65 >78 mL/min   Anion gap 8 5 - 15    Assessment & Plan:   Problem List Items Addressed This Visit     Type 2 diabetes mellitus with other specified complication (HCC)    Continue to hold metformin, farxiga.  Lab Results  Component Value Date   HGBA1C 5.8 12/26/2022       GERD (gastroesophageal reflux disease)    Denies reflux symptoms - will continue to hold PPI at this time.  Previously controlled with MWF PPI       FTT (failure to thrive) in adult    This is slowly improving.       Paroxysmal atrial fibrillation (HCC)    Continue full dose eliquis, restart toprol XL at 25mg  daily.       Relevant Medications   metoprolol succinate (TOPROL-XL) 25 MG 24 hr tablet   furosemide (LASIX) 40 MG tablet   Recurrent right  pleural effusion    Small on latest CT. Continue to monitor. Anticipated continued improvement with hepatic abscess treatment      Hepatic abscess - Primary    Continue drain, keep IR f/u tomorrow. Drain output continues decreasing. Planning to complete 4 more weeks of antibiotics - IV cephalexin and PO flagyl.  Keep ID appt later this week.       Protein-calorie malnutrition, severe (HCC)    Improving on daily ensure supplementation.  Will continue remeron 15mg  daily for appetite and sleep, try to use in place of tylenol PM.       Chronic diastolic CHF (congestive heart failure) (HCC)    Restart lasix at lower dose to see if effecting diuresis (20mg  daily)      Relevant Medications   metoprolol succinate (TOPROL-XL) 25 MG 24 hr tablet   furosemide (LASIX) 40 MG tablet   Decubitus ulcer of right buttock, stage 1    This continues to improve with home wound care  through wound RN.         Meds ordered this encounter  Medications   mirtazapine (REMERON) 15 MG tablet    Sig: Take 1 tablet (15 mg total) by mouth at bedtime.    Dispense:  30 tablet    Refill:  3   metoprolol succinate (TOPROL-XL) 25 MG 24 hr tablet    Sig: Take 1 tablet (25 mg total) by mouth daily. Take with or immediately following a meal.    Dispense:  90 tablet    Refill:  1    Note new dose   furosemide (LASIX) 40 MG tablet    Sig: Take 0.5 tablets (20 mg total) by mouth daily.    Dispense:  30 tablet    Refill:  3    No orders of the defined types were placed in this encounter.   Patient Instructions  For sleep and appetite take remeron (mirtazapine) 15mg  nightly.  Continue ensure 2-3 per day.  Restart lasix 40mg  1/2 tablet daily in the morning, if not increasing urination may increase to whole tablet.  Restart Toprol XL (metoprolol )1/2 tablet of 50mg  tablet (25mg  dose) Stay off farxiga. Talk with Dr Okey Dupre about Sherryll Burger.  Good to see you today.  Return in 6 weeks for follow up visit.   Follow  up plan: Return in about 6 weeks (around 03/05/2023), or if symptoms worsen or fail to improve, for follow up visit.  [name], MD

## 2023-01-22 NOTE — Telephone Encounter (Signed)
Appt cancelled. Please call to reschedule.

## 2023-01-23 ENCOUNTER — Ambulatory Visit
Admission: RE | Admit: 2023-01-23 | Discharge: 2023-01-23 | Disposition: A | Discharge status: Home / Self Care | Payer: Medicare PPO | Source: Ambulatory Visit | Attending: Internal Medicine | Admitting: Internal Medicine

## 2023-01-23 ENCOUNTER — Ambulatory Visit
Admission: RE | Admit: 2023-01-23 | Discharge: 2023-01-23 | Disposition: A | Discharge status: Home / Self Care | Payer: Medicare PPO | Source: Ambulatory Visit | Attending: Student | Admitting: Student

## 2023-01-23 DIAGNOSIS — K551 Chronic vascular disorders of intestine: Secondary | ICD-10-CM | POA: Diagnosis not present

## 2023-01-23 DIAGNOSIS — B9620 Unspecified Escherichia coli [E. coli] as the cause of diseases classified elsewhere: Secondary | ICD-10-CM | POA: Diagnosis not present

## 2023-01-23 DIAGNOSIS — M7989 Other specified soft tissue disorders: Secondary | ICD-10-CM | POA: Diagnosis not present

## 2023-01-23 DIAGNOSIS — I080 Rheumatic disorders of both mitral and aortic valves: Secondary | ICD-10-CM | POA: Diagnosis not present

## 2023-01-23 DIAGNOSIS — I480 Paroxysmal atrial fibrillation: Secondary | ICD-10-CM | POA: Diagnosis not present

## 2023-01-23 DIAGNOSIS — K750 Abscess of liver: Secondary | ICD-10-CM

## 2023-01-23 DIAGNOSIS — I2510 Atherosclerotic heart disease of native coronary artery without angina pectoris: Secondary | ICD-10-CM | POA: Diagnosis not present

## 2023-01-23 DIAGNOSIS — I77810 Thoracic aortic ectasia: Secondary | ICD-10-CM | POA: Diagnosis not present

## 2023-01-23 DIAGNOSIS — I700 Atherosclerosis of aorta: Secondary | ICD-10-CM | POA: Diagnosis not present

## 2023-01-23 DIAGNOSIS — I5042 Chronic combined systolic (congestive) and diastolic (congestive) heart failure: Secondary | ICD-10-CM | POA: Diagnosis not present

## 2023-01-23 DIAGNOSIS — L89312 Pressure ulcer of right buttock, stage 2: Secondary | ICD-10-CM | POA: Diagnosis not present

## 2023-01-23 HISTORY — PX: IR RADIOLOGIST EVAL & MGMT: IMG5224

## 2023-01-23 MED ORDER — IOPAMIDOL (ISOVUE-300) INJECTION 61%
100.0000 mL | Freq: Once | INTRAVENOUS | Status: AC | PRN
  Administered 2023-01-23: 100 mL via INTRAVENOUS

## 2023-01-23 NOTE — Assessment & Plan Note (Addendum)
Improving on daily ensure supplementation.  Will continue remeron 15mg  daily for appetite and sleep, try to use in place of tylenol PM.

## 2023-01-23 NOTE — Assessment & Plan Note (Addendum)
Denies reflux symptoms - will continue to hold PPI at this time.  Previously controlled with MWF PPI

## 2023-01-23 NOTE — Progress Notes (Signed)
Contacted the patient and attempted to reschedule the appointment with PharmD.  Counseled the patient on purpose of visit with PharmD and patient and wife decline to reschedule at this time .   Al Corpus, PharmD notified  Burt Knack, [facility] Clinical Pharmacy Assistant [phone]

## 2023-01-23 NOTE — Assessment & Plan Note (Signed)
Continue full dose eliquis, restart toprol XL at 25mg  daily.

## 2023-01-23 NOTE — Assessment & Plan Note (Signed)
Continue drain, keep IR f/u tomorrow. Drain output continues decreasing. Planning to complete 4 more weeks of antibiotics - IV cephalexin and PO flagyl.  Keep ID appt later this week.

## 2023-01-23 NOTE — Assessment & Plan Note (Signed)
Small on latest CT. Continue to monitor. Anticipated continued improvement with hepatic abscess treatment

## 2023-01-23 NOTE — Assessment & Plan Note (Addendum)
Continue to hold metformin, farxiga.  Lab Results  Component Value Date   HGBA1C 5.8 12/26/2022

## 2023-01-23 NOTE — Assessment & Plan Note (Addendum)
This continues to improve with home wound care through wound RN.

## 2023-01-23 NOTE — Assessment & Plan Note (Signed)
This is slowly improving.  

## 2023-01-23 NOTE — Assessment & Plan Note (Signed)
Restart lasix at lower dose to see if effecting diuresis (20mg  daily)

## 2023-01-24 DIAGNOSIS — K750 Abscess of liver: Secondary | ICD-10-CM | POA: Diagnosis not present

## 2023-01-24 DIAGNOSIS — I480 Paroxysmal atrial fibrillation: Secondary | ICD-10-CM | POA: Diagnosis not present

## 2023-01-24 DIAGNOSIS — B9620 Unspecified Escherichia coli [E. coli] as the cause of diseases classified elsewhere: Secondary | ICD-10-CM | POA: Diagnosis not present

## 2023-01-24 DIAGNOSIS — I5042 Chronic combined systolic (congestive) and diastolic (congestive) heart failure: Secondary | ICD-10-CM | POA: Diagnosis not present

## 2023-01-24 DIAGNOSIS — I080 Rheumatic disorders of both mitral and aortic valves: Secondary | ICD-10-CM | POA: Diagnosis not present

## 2023-01-24 DIAGNOSIS — I2510 Atherosclerotic heart disease of native coronary artery without angina pectoris: Secondary | ICD-10-CM | POA: Diagnosis not present

## 2023-01-24 DIAGNOSIS — I700 Atherosclerosis of aorta: Secondary | ICD-10-CM | POA: Diagnosis not present

## 2023-01-24 DIAGNOSIS — L89312 Pressure ulcer of right buttock, stage 2: Secondary | ICD-10-CM | POA: Diagnosis not present

## 2023-01-24 DIAGNOSIS — I77810 Thoracic aortic ectasia: Secondary | ICD-10-CM | POA: Diagnosis not present

## 2023-01-25 ENCOUNTER — Telehealth: Payer: Self-pay

## 2023-01-25 ENCOUNTER — Ambulatory Visit: Payer: Medicare PPO | Attending: Infectious Diseases | Admitting: Infectious Diseases

## 2023-01-25 ENCOUNTER — Encounter: Payer: Self-pay | Admitting: Infectious Diseases

## 2023-01-25 VITALS — BP 125/77 | HR 75 | Temp 98.7°F | Ht 70.0 in | Wt 219.0 lb

## 2023-01-25 DIAGNOSIS — I5022 Chronic systolic (congestive) heart failure: Secondary | ICD-10-CM | POA: Insufficient documentation

## 2023-01-25 DIAGNOSIS — J90 Pleural effusion, not elsewhere classified: Secondary | ICD-10-CM | POA: Diagnosis not present

## 2023-01-25 DIAGNOSIS — I2510 Atherosclerotic heart disease of native coronary artery without angina pectoris: Secondary | ICD-10-CM | POA: Diagnosis not present

## 2023-01-25 DIAGNOSIS — I110 Hypertensive heart disease with heart failure: Secondary | ICD-10-CM | POA: Diagnosis not present

## 2023-01-25 DIAGNOSIS — Z951 Presence of aortocoronary bypass graft: Secondary | ICD-10-CM | POA: Diagnosis not present

## 2023-01-25 DIAGNOSIS — I509 Heart failure, unspecified: Secondary | ICD-10-CM | POA: Diagnosis not present

## 2023-01-25 DIAGNOSIS — I480 Paroxysmal atrial fibrillation: Secondary | ICD-10-CM | POA: Insufficient documentation

## 2023-01-25 DIAGNOSIS — I130 Hypertensive heart and chronic kidney disease with heart failure and stage 1 through stage 4 chronic kidney disease, or unspecified chronic kidney disease: Secondary | ICD-10-CM | POA: Insufficient documentation

## 2023-01-25 DIAGNOSIS — Z8249 Family history of ischemic heart disease and other diseases of the circulatory system: Secondary | ICD-10-CM | POA: Diagnosis not present

## 2023-01-25 DIAGNOSIS — K750 Abscess of liver: Secondary | ICD-10-CM | POA: Diagnosis not present

## 2023-01-25 DIAGNOSIS — Z7901 Long term (current) use of anticoagulants: Secondary | ICD-10-CM | POA: Diagnosis not present

## 2023-01-25 NOTE — Telephone Encounter (Signed)
Per Dr. Rivka Safer last dose of IV abx on 02/01/22 and picc can be removed same day.  I have updated [facility] and our pharmacy team with updated orders.  [name] Young

## 2023-01-25 NOTE — Patient Instructions (Signed)
You are here for follow up of liver abscess- the drain was removed on 5/14- you will complete IV antibioc on 5/24 and the PICC can be removed after that

## 2023-01-25 NOTE — Progress Notes (Signed)
NAME: [name] Young.  DOB: July 05, 1941  MRN: [id number]  Date/Time: 01/25/2023 10:40 AM   Subjective:  Pt here with his wife Follow up after recent hospitalization for liver abscess Was sent home on IV ceftriaxone and PO flagyl until 02/04/23 Had hepatic drain removed on 01/23/23 C/o some rt lumbar area skin/[place] swelling since Saturday No pain or redness CT scan only showed [place] edema ? Paull Schrimsher. is a 81 y.o. with a history of CAD/CABG/Afib/Streptococcus and ecoli bacteremia in feb 2023 due to ascendign cholangitis from choledocholithiasis s/p ERCP 2/212/3, mitral valve endocarditis treated with 6 weeks of IV cefazolin presented on 12/28/22 from cardiology office with fatigue not feeling well.  Increasing SOB. Ct abdomen and chest showed rt pleural effusion and enlarging 15 x 18 to 9.8 cm complex fluid collection along the ventral margin of the liver increase since the CAT scan done in August 2023. Patient had a thoracentesis on 12/29/2022. About 1 L of fluid removed from the right pleural space. Culture neg- cell count 1581 ( 50% N, LDH 71 and TP 3.7g) He had a drain placed in the liver abscess on 12/29/2022.  500 cc of purulent fluid removed from the perihepatic collection. The liver abscess culture had E. coli.He was initially treated with zosyn and switched to ceftriaxone 2 grams IV for 4 weeks until 5/16 but this was extended to 5/27 as he was hospitalized again and had further aspiration of a new collection on 01/08/23 and culture was neg-  ceftriaxone was extended until 5/27 and flagyl was added by covering ID  He is doing better   Past Medical History:  Diagnosis Date   Adenomatous colon polyp    Aortic atherosclerosis (HCC)    Arthritis    Ascending aorta dilatation (HCC)    a.) TTE 11/23/2017: asc Ao measured 37 mm; b.) TTE 10/23/2021: Ao root measured 41 mm, asc Ao measured 38 mm; c.) TEE 10/28/2021: asc Ao measured 38 mm; d.) TTE 01/11/2022: Ao root 40 mm, asc Ao 39 mm    Ascending cholangitis 10/2021   BPH (benign prostatic hyperplasia)    Cardiomyopathy (HCC)    CKD (chronic kidney disease), stage III (HCC)    Claustrophobia    Coronary artery disease    a.) LHC 12/21/2017: 75% mLAD, 90% D1, 80-99% OM1/2, CTO pRCA (L-R collaterals) --> CVTS consult. 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   In setting of bacteremia from ascending colangitis   GERD (gastroesophageal reflux disease)    HFrEF (heart failure with reduced ejection fraction) (HCC)    a.) TTE 11/23/2017: EF 50-55%, mod MAC, triv TR, G1DD; b.) TTE 10/23/2021: EF 40-45%, mild LAE, Ao sclerosis, triv MR, G2DD; c.) TEE 10/28/2021: EF 40-45%, glob HK, mobile vegitation on MV; d.) TTE 01/11/2022: EF 50-55%, mild LVH, RVE, Ao sclerosis, mild MR/AR, G1DD   History of cholelithiasis    History of kidney stones    ca ox Vonita Moss @ [facility]) now [employer]   History of pneumonia    HLD (hyperlipidemia)    HTN (hypertension)    Jaundice    age 38   Long term current use of anticoagulant    a.) apixaban   Paroxysmal atrial fibrillation (HCC)    a.) CHA2DS2VASc = 6 (age x 2, HFrEF, HTN, vascular disease history, T2DM);  b.) rate/rhythm maintained on oral metoprolol succinate; chronically anticoagulated with apixaban   Pneumonia    Right-sided carotid  artery disease (HCC)    a.) carotid doppler 04/05/2022: 1-39% RICA   S/P CABG x 3    a.) LIMA-LAD, SVG-diagonal, SVG-PL branch of RCA   S/P cataract extraction and insertion of intraocular lens    T2DM (type 2 diabetes mellitus) (HCC) 2010    Past Surgical History:  Procedure Laterality Date   CARPAL TUNNEL RELEASE Bilateral    CATARACT EXTRACTION, BILATERAL     COLONOSCOPY  11/2012   11 adenomatous polyps, diverticulosis, rec rpt 1 yr [name])   COLONOSCOPY  12/2013   3 polyps, diverticulosis, rec rpt 3 yrs [name])   COLONOSCOPY  06/2019   6 polyps (TA), diverticulosis, f/u left open ended  [name])   CORONARY ARTERY BYPASS GRAFT N/A 01/18/2018   Procedure: CORONARY ARTERY BYPASS GRAFTING (CABG) x 3; Using Left Internal Mammary Artery, and Right Greater Saphenous Vein harvested Endoscopically, Coronary Artery Endarterectomy;  Surgeon: Kerin Perna, MD;  Location: [facility] OR;  Service: Open Heart Surgery;  Laterality: N/A;   ERCP N/A 10/23/2021   Procedure: ENDOSCOPIC RETROGRADE CHOLANGIOPANCREATOGRAPHY (ERCP);  Surgeon: Meryl Dare, MD;  Location: [facility] ENDOSCOPY;  Service: Endoscopy;  Laterality: N/A;   IR CATHETER TUBE CHANGE  01/08/2023   IR RADIOLOGIST EVAL & MGMT  01/23/2023   IR THORACENTESIS ASP PLEURAL SPACE W/IMG GUIDE  12/29/2022   KNEE CARTILAGE SURGERY Left    LEFT HEART CATH AND CORONARY ANGIOGRAPHY N/A 12/21/2017   Procedure: LEFT HEART CATH AND CORONARY ANGIOGRAPHY;  Surgeon: Yvonne Kendall, MD;  Location: [facility] INVASIVE CV LAB;  Service: Cardiovascular;  Laterality: N/A;   LITHOTRIPSY     REMOVAL OF STONES  10/23/2021   Procedure: REMOVAL OF STONES;  Surgeon: Meryl Dare, MD;  Location[facility] ENDOSCOPY;  Service: Endoscopy;;   SPHINCTEROTOMY  10/23/2021   Procedure: Dennison Mascot;  Surgeon: Meryl Dare, MD;  Location: [facility] ENDOSCOPY;  Service: Endoscopy;;   TEE WITHOUT CARDIOVERSION N/A 01/18/2018   Procedure: TRANSESOPHAGEAL ECHOCARDIOGRAM (TEE);  Surgeon: Donata Clay, Theron Arista, MD;  Location: [facility] OR;  Service: Open Heart Surgery;  Laterality: N/A;   TEE WITHOUT CARDIOVERSION N/A 10/28/2021   Procedure: TRANSESOPHAGEAL ECHOCARDIOGRAM (TEE);  Surgeon: Chrystie Nose, MD;  Location: [facility] ENDOSCOPY;  Service: Cardiovascular;  Laterality: N/A;   UMBILICAL HERNIA REPAIR     with mesh    Social History   Socioeconomic History   Marital status: Married    Spouse name: Not on file   Number of children: Not on file   Years of education: Not on file   Highest education level: 12th grade  Occupational History   Not on file  Tobacco Use   Smoking status: Never    Passive  exposure: Past   Smokeless tobacco: Former    Types: Chew    Quit date: 09/08/2001  Vaping Use   Vaping Use: Never used  Substance and Sexual Activity   Alcohol use: Not Currently    Comment: occ   Drug use: No   Sexual activity: Not Currently  Other Topics Concern   Not on file  Social History Narrative   Caffeine: occasional Lives with wife, grandson [name] 2009)Occupation: retired, 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.Diet: some water, fruits/vegetables daily, avoids potatoes   Social Determinants of Health   Financial Resource Strain: Low Risk  (12/26/2022)   Overall Financial Resource Strain (CARDIA)    Difficulty of Paying Living Expenses: Not hard at all  Food Insecurity: Unknown (01/19/2023)   Hunger Vital Sign  Worried About Programme researcher, broadcasting/film/video in the Last Year: Never true    Ran Out of Food in the Last Year: Patient declined  Transportation Needs: No Transportation Needs (01/19/2023)   PRAPARE - Administrator, Civil Service (Medical): No    Lack of Transportation (Non-Medical): No  Physical Activity: Unknown (12/26/2022)   Exercise Vital Sign    Days of Exercise per Week: Patient declined    Minutes of Exercise per Session: Not on file  Stress: No Stress Concern Present (12/26/2022)   [employer] of Occupational Health - Occupational Stress Questionnaire    Feeling of Stress : Not at all  Social Connections: Unknown (12/26/2022)   Social Connection and Isolation Panel [NHANES]    Frequency of Communication with Friends and Family: Patient declined    Frequency of Social Gatherings with Friends and Family: Patient declined    Attends Religious Services: Patient declined    Database administrator or Organizations: Yes    Attends Banker Meetings: Patient declined    Marital Status: Married  Catering manager Violence: Not on file    Family History  Problem Relation Age of Onset   Alzheimer's disease Mother     Breast cancer Mother        breast   Atrial fibrillation Mother    CAD Mother    Stroke Father 28   Diabetes Father    CAD Father    Colon polyps Sister    Colon polyps Brother    Rectal cancer Maternal Grandfather        rectal   Colon cancer Maternal Grandfather 40   Esophageal cancer Neg Hx    Stomach cancer Neg Hx    No Known Allergies I? Current Outpatient Medications  Medication Sig Dispense Refill   acetaminophen (TYLENOL) 325 MG tablet Take 1 tablet (325 mg total) by mouth at bedtime as needed (sleep aid).     apixaban (ELIQUIS) 5 MG TABS tablet TAKE 1 TABLET BY MOUTH TWICE A DAY 60 tablet 5   cefTRIAXone (ROCEPHIN) IVPB Inject 2 g into the vein daily for 23 days. Indication:  E Coli liver abscess First Dose: Yes Last Day of Therapy:  02/05/2023 Labs - Once weekly:  CBC/D and CMP, Please pull PIC at completion of IV antibiotics Fax weekly lab results  promptly to [phone] Method of administration: IV Push Method of administration may be changed at the discretion of home infusion pharmacist based upon assessment of the patient and/or caregiver's ability to self-administer the medication ordered. 24 Units 0   Cholecalciferol (VITAMIN D3) 25 MCG (1000 UT) CAPS Take 1 capsule (1,000 Units total) by mouth daily. 30 capsule    Cyanocobalamin (B-12) 1000 MCG CAPS Take by mouth daily at 2 am.     feeding supplement (ENSURE ENLIVE / ENSURE PLUS) LIQD Take 237 mLs by mouth 2 (two) times daily between meals. 14220 mL 0   furosemide (LASIX) 40 MG tablet Take 0.5 tablets (20 mg total) by mouth daily. 30 tablet 3   liver oil-zinc oxide (DESITIN) 40 % ointment Apply topically 3 (three) times daily. 56.7 g 0   metoprolol succinate (TOPROL-XL) 25 MG 24 hr tablet Take 1 tablet (25 mg total) by mouth daily. Take with or immediately following a meal. 90 tablet 1   metroNIDAZOLE (FLAGYL) 500 MG tablet Take 1 tablet (500 mg total) by mouth every 12 (twelve) hours for 51 doses. 51 tablet 0    mirtazapine (REMERON) 15  MG tablet Take 1 tablet (15 mg total) by mouth at bedtime. 30 tablet 3   Multiple Vitamins-Minerals (MACULAR VITAMIN BENEFIT PO) Take 1 tablet by mouth daily.     nitroGLYCERIN (NITROSTAT) 0.4 MG SL tablet Place 1 tablet (0.4 mg total) under the tongue every 5 (five) minutes as needed for chest pain. 25 tablet 4   oxyCODONE-acetaminophen (PERCOCET/ROXICET) 5-325 MG tablet Take 1 tablet by mouth every 6 (six) hours as needed for moderate pain. 30 tablet 0   rosuvastatin (CRESTOR) 10 MG tablet TAKE 1 TABLET BY MOUTH EVERY DAY 90 tablet 3   sodium chloride flush (NS) 0.9 % SOLN Inject 3 mLs into the vein every 12 (twelve) hours. 60 mL 0   tamsulosin (FLOMAX) 0.4 MG CAPS capsule TAKE 1 CAPSULE BY MOUTH EVERY DAY 90 capsule 3   No current facility-administered medications for this visit.     Abtx:  Anti-infectives (From admission, onward)    None       REVIEW OF SYSTEMS:  Const: negative fever, negative chills, negative weight loss Eyes: negative diplopia or visual changes, negative eye pain ENT: negative coryza, negative sore throat Resp: negative cough, hemoptysis, dyspnea Cards: negative for chest pain, palpitations, lower extremity edema GU: negative for frequency, dysuria and hematuria GI: Negative for abdominal pain, diarrhea, bleeding, constipation Swelling rt side of the abdominal wall Skin: negative for rash and pruritus Heme: negative for easy bruising and gum/nose bleeding MS: weakness Neurolo:negative for headaches, dizziness, vertigo, memory problems  Psych: negative for feelings of anxiety, depression  Endocrine: negative for thyroid, diabetes Allergy/Immunology- negative for any medication or food allergies  Objective:  VITALS:  BP 125/77   Pulse 75   Temp 98.7 F (37.1 C) (Temporal)   Ht 5\' 10"  (1.778 m)   Wt 219 lb (99.3 kg)   BMI 31.42 kg/m   LDA Rt PICC PHYSICAL EXAM:  General: Alert, cooperative, no distress, appears stated  age.  Head: Normocephalic, without obvious abnormality, atraumatic. Eyes: Conjunctivae clear, anicteric sclerae. Pupils are equal ENT Nares normal. No drainage or sinus tenderness. Lips, mucosa, and tongue normal. No Thrush Neck: Supple, symmetrical, no adenopathy, thyroid: non tender no carotid bruit and no JVD. Back: No CVA tenderness. Lungs: b/l air entry- decreased rt base Heart: Regular rate and rhythm, no murmur, rub or gallop. Abdomen: Soft, non-tender,not distended. Bowel sounds normal. No masses Rt abdominal wall edema- no redness or tenderness Extremities: atraumatic, no cyanosis. No edema. No clubbing Skin: No rashes or lesions. Or bruising Lymph: Cervical, supraclavicular normal. Neurologic: Grossly non-focal Pertinent Labs Lab Results CBC    Component Value Date/Time   WBC 4.7 01/21/2023 0840   RBC 4.20 (L) 01/21/2023 0840   HGB 11.6 (L) 01/21/2023 0840   HGB 12.0 (L) 01/11/2018 0000   HCT 38.6 (L) 01/21/2023 0840   HCT 37.2 (L) 01/11/2018 0000   PLT 261 01/21/2023 0840   PLT 190 01/11/2018 0000   MCV 91.9 01/21/2023 0840   MCV 91 01/11/2018 0000   MCH 27.6 01/21/2023 0840   MCHC 30.1 01/21/2023 0840   RDW 17.3 (H) 01/21/2023 0840   RDW 14.2 01/11/2018 0000   LYMPHSABS 1.7 01/21/2023 0840   LYMPHSABS 1.8 01/11/2018 0000   MONOABS 0.3 01/21/2023 0840   EOSABS 0.3 01/21/2023 0840   EOSABS 0.1 01/11/2018 0000   BASOSABS 0.0 01/21/2023 0840   BASOSABS 0.0 01/11/2018 0000       Latest Ref Rng & Units 01/21/2023    8:40 AM 01/12/2023  5:18 AM 01/11/2023    5:06 AM  CMP  Glucose 70 - 99 mg/dL 119  89  88   BUN 8 - 23 mg/dL 10  10  11    Creatinine 0.61 - 1.24 mg/dL 1.47  8.29  5.62   Sodium 135 - 145 mmol/L 137  140  138   Potassium 3.5 - 5.1 mmol/L 3.9  3.8  3.9   Chloride 98 - 111 mmol/L 106  108  106   CO2 22 - 32 mmol/L 23  26  26    Calcium 8.9 - 10.3 mg/dL 8.5  8.4  8.1   Total Protein 6.5 - 8.1 g/dL 6.2     Total Bilirubin 0.3 - 1.2 mg/dL 0.3      Alkaline Phos 38 - 126 U/L 53     AST 15 - 41 U/L 28     ALT 0 - 44 U/L 14       ? Impression/Recommendation ?Liver abscess had drain placed on 12/29/22 and was removed on 01/23/23 E. coli in culture Pt on ceftriaxone + flagyl until 02/04/23 Okay to finish on 02/01/23 and PICC can be removed as recent CT showed much improved liver abscess and drain has been removed  Patient in 2023 had E. coli and Streptococcus bacteremia following ascending cholangitis due to choledocholithiasis for which she got ERCP and stone removal   During that hospitalization he also had mitral valve endocarditis and was treated with 6 weeks of cefazolin   His liver abscess is secondary to the choledocholithiasis and ascending cholangitis from the time.     Congestive heart failure- followed by cardiology On frusemide   Right pleural effusion.  Recurrent has had multiple thoracentesis.   CAD status post CABG   Anemia   Paroxysmal A-fib on Eliquis  Rt upper abdominal/flank wall edema - no evidence of cellulitis ? ? ___________________________________________________ Discussed with patient, and wife Follow PRN Note:  This document was prepared using Dragon voice recognition software and may include unintentional dictation errors.

## 2023-01-26 DIAGNOSIS — I080 Rheumatic disorders of both mitral and aortic valves: Secondary | ICD-10-CM | POA: Diagnosis not present

## 2023-01-26 DIAGNOSIS — L89312 Pressure ulcer of right buttock, stage 2: Secondary | ICD-10-CM | POA: Diagnosis not present

## 2023-01-26 DIAGNOSIS — B9620 Unspecified Escherichia coli [E. coli] as the cause of diseases classified elsewhere: Secondary | ICD-10-CM | POA: Diagnosis not present

## 2023-01-26 DIAGNOSIS — K750 Abscess of liver: Secondary | ICD-10-CM | POA: Diagnosis not present

## 2023-01-26 DIAGNOSIS — I480 Paroxysmal atrial fibrillation: Secondary | ICD-10-CM | POA: Diagnosis not present

## 2023-01-26 DIAGNOSIS — I2510 Atherosclerotic heart disease of native coronary artery without angina pectoris: Secondary | ICD-10-CM | POA: Diagnosis not present

## 2023-01-26 DIAGNOSIS — I77810 Thoracic aortic ectasia: Secondary | ICD-10-CM | POA: Diagnosis not present

## 2023-01-26 DIAGNOSIS — I700 Atherosclerosis of aorta: Secondary | ICD-10-CM | POA: Diagnosis not present

## 2023-01-26 DIAGNOSIS — I5042 Chronic combined systolic (congestive) and diastolic (congestive) heart failure: Secondary | ICD-10-CM | POA: Diagnosis not present

## 2023-01-27 DIAGNOSIS — K750 Abscess of liver: Secondary | ICD-10-CM | POA: Diagnosis not present

## 2023-01-30 DIAGNOSIS — K750 Abscess of liver: Secondary | ICD-10-CM | POA: Diagnosis not present

## 2023-01-30 DIAGNOSIS — I480 Paroxysmal atrial fibrillation: Secondary | ICD-10-CM | POA: Diagnosis not present

## 2023-01-30 DIAGNOSIS — I700 Atherosclerosis of aorta: Secondary | ICD-10-CM | POA: Diagnosis not present

## 2023-01-30 DIAGNOSIS — I2510 Atherosclerotic heart disease of native coronary artery without angina pectoris: Secondary | ICD-10-CM | POA: Diagnosis not present

## 2023-01-30 DIAGNOSIS — I080 Rheumatic disorders of both mitral and aortic valves: Secondary | ICD-10-CM | POA: Diagnosis not present

## 2023-01-30 DIAGNOSIS — B9620 Unspecified Escherichia coli [E. coli] as the cause of diseases classified elsewhere: Secondary | ICD-10-CM | POA: Diagnosis not present

## 2023-01-30 DIAGNOSIS — I77810 Thoracic aortic ectasia: Secondary | ICD-10-CM | POA: Diagnosis not present

## 2023-01-30 DIAGNOSIS — L89312 Pressure ulcer of right buttock, stage 2: Secondary | ICD-10-CM | POA: Diagnosis not present

## 2023-01-30 DIAGNOSIS — I5042 Chronic combined systolic (congestive) and diastolic (congestive) heart failure: Secondary | ICD-10-CM | POA: Diagnosis not present

## 2023-02-01 DIAGNOSIS — B9620 Unspecified Escherichia coli [E. coli] as the cause of diseases classified elsewhere: Secondary | ICD-10-CM | POA: Diagnosis not present

## 2023-02-01 DIAGNOSIS — I480 Paroxysmal atrial fibrillation: Secondary | ICD-10-CM | POA: Diagnosis not present

## 2023-02-01 DIAGNOSIS — I77810 Thoracic aortic ectasia: Secondary | ICD-10-CM | POA: Diagnosis not present

## 2023-02-01 DIAGNOSIS — K750 Abscess of liver: Secondary | ICD-10-CM | POA: Diagnosis not present

## 2023-02-01 DIAGNOSIS — I2510 Atherosclerotic heart disease of native coronary artery without angina pectoris: Secondary | ICD-10-CM | POA: Diagnosis not present

## 2023-02-01 DIAGNOSIS — L89312 Pressure ulcer of right buttock, stage 2: Secondary | ICD-10-CM | POA: Diagnosis not present

## 2023-02-01 DIAGNOSIS — I5042 Chronic combined systolic (congestive) and diastolic (congestive) heart failure: Secondary | ICD-10-CM | POA: Diagnosis not present

## 2023-02-01 DIAGNOSIS — I080 Rheumatic disorders of both mitral and aortic valves: Secondary | ICD-10-CM | POA: Diagnosis not present

## 2023-02-01 DIAGNOSIS — I700 Atherosclerosis of aorta: Secondary | ICD-10-CM | POA: Diagnosis not present

## 2023-02-02 DIAGNOSIS — L89312 Pressure ulcer of right buttock, stage 2: Secondary | ICD-10-CM | POA: Diagnosis not present

## 2023-02-02 DIAGNOSIS — I080 Rheumatic disorders of both mitral and aortic valves: Secondary | ICD-10-CM | POA: Diagnosis not present

## 2023-02-02 DIAGNOSIS — I700 Atherosclerosis of aorta: Secondary | ICD-10-CM | POA: Diagnosis not present

## 2023-02-02 DIAGNOSIS — I2510 Atherosclerotic heart disease of native coronary artery without angina pectoris: Secondary | ICD-10-CM | POA: Diagnosis not present

## 2023-02-02 DIAGNOSIS — B9620 Unspecified Escherichia coli [E. coli] as the cause of diseases classified elsewhere: Secondary | ICD-10-CM | POA: Diagnosis not present

## 2023-02-02 DIAGNOSIS — I480 Paroxysmal atrial fibrillation: Secondary | ICD-10-CM | POA: Diagnosis not present

## 2023-02-02 DIAGNOSIS — I5042 Chronic combined systolic (congestive) and diastolic (congestive) heart failure: Secondary | ICD-10-CM | POA: Diagnosis not present

## 2023-02-02 DIAGNOSIS — K750 Abscess of liver: Secondary | ICD-10-CM | POA: Diagnosis not present

## 2023-02-02 DIAGNOSIS — I77810 Thoracic aortic ectasia: Secondary | ICD-10-CM | POA: Diagnosis not present

## 2023-02-07 DIAGNOSIS — I77810 Thoracic aortic ectasia: Secondary | ICD-10-CM | POA: Diagnosis not present

## 2023-02-07 DIAGNOSIS — I700 Atherosclerosis of aorta: Secondary | ICD-10-CM | POA: Diagnosis not present

## 2023-02-07 DIAGNOSIS — B9620 Unspecified Escherichia coli [E. coli] as the cause of diseases classified elsewhere: Secondary | ICD-10-CM | POA: Diagnosis not present

## 2023-02-07 DIAGNOSIS — K750 Abscess of liver: Secondary | ICD-10-CM | POA: Diagnosis not present

## 2023-02-07 DIAGNOSIS — I480 Paroxysmal atrial fibrillation: Secondary | ICD-10-CM | POA: Diagnosis not present

## 2023-02-07 DIAGNOSIS — L89312 Pressure ulcer of right buttock, stage 2: Secondary | ICD-10-CM | POA: Diagnosis not present

## 2023-02-07 DIAGNOSIS — I5042 Chronic combined systolic (congestive) and diastolic (congestive) heart failure: Secondary | ICD-10-CM | POA: Diagnosis not present

## 2023-02-07 DIAGNOSIS — I080 Rheumatic disorders of both mitral and aortic valves: Secondary | ICD-10-CM | POA: Diagnosis not present

## 2023-02-07 DIAGNOSIS — I2510 Atherosclerotic heart disease of native coronary artery without angina pectoris: Secondary | ICD-10-CM | POA: Diagnosis not present

## 2023-02-13 ENCOUNTER — Ambulatory Visit: Payer: Self-pay

## 2023-02-13 NOTE — Patient Instructions (Signed)
Visit Information  Thank you for taking time to visit with me today. Please don't hesitate to contact me if I can be of assistance to you.   Following are the goals we discussed today:  Call 911 for dangerously low heart rate < 40.  Signs/ symptoms of lightheadedness, dizziness, fainting, chest pain Notify provider for new or ongoing symptoms.  Take medications as prescribed Keep follow up appointments with provider.   Our next appointment is by telephone on 04/05/23 at 2:30 pm   Please call the care guide team at [phone] if you need to cancel or reschedule your appointment.   If you are experiencing a Mental Health or Behavioral Health Crisis or need someone to talk to, please call the Suicide and Crisis Lifeline: 988 call 1-800-273-TALK (toll free, 24 hour hotline)  Patient verbalizes understanding of instructions and care plan provided today and agrees to view in MyChart. Active MyChart status and patient understanding of how to access instructions and care plan via MyChart confirmed with patient.     George Ina RN,BSN,CCM [facility] Care Coordination [phone] direct line

## 2023-02-13 NOTE — Patient Outreach (Signed)
  Care Coordination   Follow Up Visit Note   02/13/2023 Name: [name] Young. MRN: [id number] DOB: March 15, 1941  [name] Young. is a 82 y.o. year old male who sees [name], MD for primary care. I spoke with  [name] Young. by phone today.  What matters to the patients health and wellness today?  Wife states patient had  drain due to liver abscess removed on 01/23/23.  She reports patient following up with PCP on 01/22/23 status post ED visit on 01/21/23 due to right lower portion of abdomen swelling.  She states patients PICC line was removed on 02/02/23 and home health services ending last week.  Wife reports patients pulse rated is going up and down. She reports patient having recent pulse rates ranging from 37 to 73.  She denies patient complaining of lightheadedness, dizziness, or passing out. Wife states patient is still weak at times.   Wife reports next follow up with cardiologist and PCP is on 03/05/23.     Goals Addressed             This Visit's Progress    Post hospital follow up / management of hepatic abcess       Interventions Today    Flowsheet Row Most Recent Value  Chronic Disease   Chronic disease during today's visit Other  [Hepatic abscess]  General Interventions   General Interventions Discussed/Reviewed General Interventions Reviewed, Doctor Visits  [evaluation of current treatment plan for hepatic abscess and patients adherence to plan as established by provider.  Assessed for new / ongoing symptoms and pain. Assessed for ongoing home health services.]  Doctor Visits Discussed/Reviewed Doctor Visits Reviewed  [reviewed scheduled /upcoming provider visits.  Message sent primary care provider regarding  patients low pulse rates per spouse.]  Exercise Interventions   Exercise Discussed/Reviewed Physical Activity  Physical Activity Discussed/Reviewed Physical Activity Reviewed  [name] patients activity level]  Education Interventions   Education  Provided Provided Education  Provided Verbal Education On --  [Advised to call 911 for signs/ symptoms of low heart rate, dizziness, lightheadedness, passing out, chest pain]  Pharmacy Interventions   Pharmacy Dicussed/Reviewed Pharmacy Topics Reviewed  [medications reviewed and compliance discussed.]              SDOH assessments and interventions completed:  No     Care Coordination Interventions:  Yes, provided   Follow up plan: Follow up call scheduled for 04/05/23    Encounter Outcome:  Pt. Visit Completed   George Ina RN,BSN,CCM [facility] Care Coordination [phone] direct line

## 2023-02-16 ENCOUNTER — Other Ambulatory Visit: Payer: Self-pay | Admitting: Family Medicine

## 2023-02-16 MED ORDER — METOPROLOL SUCCINATE ER 25 MG PO TB24: 12.5000 mg | ORAL_TABLET | Freq: Every day | ORAL | 1 refills | Status: DC

## 2023-02-16 NOTE — Telephone Encounter (Signed)
Pharmacy requesting 90day ok to change

## 2023-02-16 NOTE — Progress Notes (Signed)
See recent CCM visit - fluctuations in pulse with frequent bradycardia - will further drop dose to 12.5mg  daily. Update with effect. Upcoming appt 03/05/2023.

## 2023-02-18 NOTE — Telephone Encounter (Signed)
ERx 

## 2023-02-19 ENCOUNTER — Telehealth: Payer: Self-pay

## 2023-02-19 NOTE — Patient Outreach (Signed)
  Care Coordination   Follow Up Visit Note   02/19/2023 Name: [name] Young. MRN: [id number] DOB: 01/19/1941  [name] Young. is a 82 y.o. year old male who sees [name], MD for primary care. I spoke with  [name] Young. by phone today.  What matters to the patients health and wellness today?  Wife states patient is taking adjusted metoprolol dosage as recommended. Wife states patients pulse readings continue to range from 40-75 and most recent blood pressure is 117/70. Per chart review next follow up with PCP and cardiovascular provider is 03/05/23.     Goals Addressed             This Visit's Progress    Post hospital follow up / management of hepatic abcess and chronic health conditions       Interventions Today    Flowsheet Row Most Recent Value  Chronic Disease   Chronic disease during today's visit Other  [low pulse rate]  General Interventions   General Interventions Discussed/Reviewed General Interventions Reviewed  [assessed for recent pulse rate and blood pressure readings.]  Doctor Visits Discussed/Reviewed Doctor Visits Reviewed  [name] upcoming provider visits.]  Education Interventions   Education Provided Provided Education  [Advised to continue monitoring and recording patients pulse and blood pressure]  Pharmacy Interventions   Pharmacy Dicussed/Reviewed Pharmacy Topics Reviewed  [confirmed patient taking new medication dosage of metoprolol as prescribed by provider.]              SDOH assessments and interventions completed:  No     Care Coordination Interventions:  Yes, provided   Follow up plan: Follow up call scheduled for as previously scheduled    Encounter Outcome:  Pt. Visit Completed   George Ina RN,BSN,CCM [facility] Care Coordination [phone] direct line

## 2023-03-05 ENCOUNTER — Encounter: Payer: Self-pay | Admitting: Family Medicine

## 2023-03-05 ENCOUNTER — Ambulatory Visit: Payer: Medicare PPO | Attending: Internal Medicine | Admitting: Internal Medicine

## 2023-03-05 ENCOUNTER — Encounter: Payer: Self-pay | Admitting: Internal Medicine

## 2023-03-05 ENCOUNTER — Ambulatory Visit: Payer: Medicare PPO | Admitting: Family Medicine

## 2023-03-05 VITALS — BP 138/80 | HR 58 | Temp 97.8°F | Ht 70.0 in | Wt 222.0 lb

## 2023-03-05 VITALS — BP 129/67 | HR 79 | Ht 70.0 in | Wt 222.8 lb

## 2023-03-05 DIAGNOSIS — E1122 Type 2 diabetes mellitus with diabetic chronic kidney disease: Secondary | ICD-10-CM | POA: Diagnosis not present

## 2023-03-05 DIAGNOSIS — E669 Obesity, unspecified: Secondary | ICD-10-CM

## 2023-03-05 DIAGNOSIS — I2510 Atherosclerotic heart disease of native coronary artery without angina pectoris: Secondary | ICD-10-CM | POA: Diagnosis not present

## 2023-03-05 DIAGNOSIS — I5032 Chronic diastolic (congestive) heart failure: Secondary | ICD-10-CM

## 2023-03-05 DIAGNOSIS — R7989 Other specified abnormal findings of blood chemistry: Secondary | ICD-10-CM | POA: Diagnosis not present

## 2023-03-05 DIAGNOSIS — D649 Anemia, unspecified: Secondary | ICD-10-CM

## 2023-03-05 DIAGNOSIS — J90 Pleural effusion, not elsewhere classified: Secondary | ICD-10-CM | POA: Diagnosis not present

## 2023-03-05 DIAGNOSIS — E43 Unspecified severe protein-calorie malnutrition: Secondary | ICD-10-CM | POA: Diagnosis not present

## 2023-03-05 DIAGNOSIS — I5022 Chronic systolic (congestive) heart failure: Secondary | ICD-10-CM | POA: Diagnosis not present

## 2023-03-05 DIAGNOSIS — E1169 Type 2 diabetes mellitus with other specified complication: Secondary | ICD-10-CM

## 2023-03-05 DIAGNOSIS — I480 Paroxysmal atrial fibrillation: Secondary | ICD-10-CM

## 2023-03-05 DIAGNOSIS — K750 Abscess of liver: Secondary | ICD-10-CM

## 2023-03-05 DIAGNOSIS — I493 Ventricular premature depolarization: Secondary | ICD-10-CM

## 2023-03-05 DIAGNOSIS — G4700 Insomnia, unspecified: Secondary | ICD-10-CM | POA: Insufficient documentation

## 2023-03-05 DIAGNOSIS — N183 Chronic kidney disease, stage 3 unspecified: Secondary | ICD-10-CM

## 2023-03-05 LAB — CBC WITH DIFFERENTIAL/PLATELET
Basophils Absolute: 0 10*3/uL (ref 0.0–0.1)
Basophils Relative: 0.4 % (ref 0.0–3.0)
Eosinophils Absolute: 0.1 10*3/uL (ref 0.0–0.7)
Eosinophils Relative: 1.9 % (ref 0.0–5.0)
HCT: 39 % (ref 39.0–52.0)
Hemoglobin: 12.6 g/dL — ABNORMAL LOW (ref 13.0–17.0)
Lymphocytes Relative: 35.2 % (ref 12.0–46.0)
Lymphs Abs: 2.5 10*3/uL (ref 0.7–4.0)
MCHC: 32.2 g/dL (ref 30.0–36.0)
MCV: 90.7 fl (ref 78.0–100.0)
Monocytes Absolute: 0.5 10*3/uL (ref 0.1–1.0)
Monocytes Relative: 6.5 % (ref 3.0–12.0)
Neutro Abs: 4 10*3/uL (ref 1.4–7.7)
Neutrophils Relative %: 56 % (ref 43.0–77.0)
Platelets: 205 10*3/uL (ref 150.0–400.0)
RBC: 4.3 Mil/uL (ref 4.22–5.81)
RDW: 18.5 % — ABNORMAL HIGH (ref 11.5–15.5)
WBC: 7.2 10*3/uL (ref 4.0–10.5)

## 2023-03-05 LAB — COMPREHENSIVE METABOLIC PANEL
ALT: 26 U/L (ref 0–53)
AST: 28 U/L (ref 0–37)
Albumin: 3.8 g/dL (ref 3.5–5.2)
Alkaline Phosphatase: 66 U/L (ref 39–117)
BUN: 29 mg/dL — ABNORMAL HIGH (ref 6–23)
CO2: 29 mEq/L (ref 19–32)
Calcium: 9.7 mg/dL (ref 8.4–10.5)
Chloride: 106 mEq/L (ref 96–112)
Creatinine, Ser: 0.93 mg/dL (ref 0.40–1.50)
GFR: 77.45 mL/min (ref >60.00)
Glucose, Bld: 96 mg/dL (ref 70–99)
Potassium: 4.6 mEq/L (ref 3.5–5.1)
Sodium: 143 mEq/L (ref 135–145)
Total Bilirubin: 0.5 mg/dL (ref 0.2–1.2)
Total Protein: 7 g/dL (ref 6.0–8.3)

## 2023-03-05 LAB — TSH: TSH: 4.62 u[IU]/mL (ref 0.35–5.50)

## 2023-03-05 LAB — IBC PANEL
Iron: 65 ug/dL (ref 42–165)
Saturation Ratios: 22.9 % (ref 20.0–50.0)
TIBC: 284.2 ug/dL (ref 250.0–450.0)
Transferrin: 203 mg/dL — ABNORMAL LOW (ref 212.0–360.0)

## 2023-03-05 LAB — FERRITIN: Ferritin: 43.5 ng/mL (ref 22.0–322.0)

## 2023-03-05 LAB — T4, FREE: Free T4: 0.67 ng/dL (ref 0.60–1.60)

## 2023-03-05 MED ORDER — ACCU-CHEK AVIVA PLUS VI STRP: ORAL_STRIP | 6 refills | Status: DC

## 2023-03-05 MED ORDER — MAGNESIUM CITRATE 125 MG PO CAPS: 250.0000 mg | ORAL_CAPSULE | Freq: Every day | ORAL | Status: DC

## 2023-03-05 NOTE — Assessment & Plan Note (Signed)
R lung overall clear - anticipate treatment of hepatic to have resolved recurrent R pleural effusion issue.

## 2023-03-05 NOTE — Assessment & Plan Note (Signed)
Update kidney function, latest check reassuringly improved.

## 2023-03-05 NOTE — Assessment & Plan Note (Signed)
He is back on full dose eliquis and Toprol XL 12.5mg  daily.

## 2023-03-05 NOTE — Assessment & Plan Note (Addendum)
Update TP, albumin levels with more regular ensure use.  Continue remeron which he tolerates well.

## 2023-03-05 NOTE — Assessment & Plan Note (Signed)
Back on lasix 40mg  1/2 tab (20mg ) daily.  No pedal edema.  Pending cards f/u.

## 2023-03-05 NOTE — Progress Notes (Unsigned)
Follow-up Outpatient Visit Date: 03/05/2023  Primary Care Provider: [name], MD [address] [place] [address]  Chief Complaint: Follow-up CAD, atrial fibrillation, and endocarditis  HPI:  [name] Young is a 82 y.o. male with history of three-vessel CAD status post CABG (01/2018) complicated by postoperative atrial fibrillation (transient atrial fibrillation recurred in the setting of sepsis related to ascending cholangitis), ascending cholangitis complicated by recurrent atrial fibrillation, acute HFrEF (LVEF 40-45%), and mitral valve endocarditis, as well as hypertension, hyperlipidemia, type 2 diabetes mellitus, hepatic abscess, and chronic kidney disease stage III, who presents for follow-up of coronary artery disease, HFrEF, and endocarditis.  I last saw him in mid April at which time he was feeling very badly, stating that he felt "like I am dying."  He was also hypotensive at that visit.  I referred him to the ED for further evaluation, where he was found to have a large hepatic abscess.  He underwent percutaneous drainage and was discharged on IV ceftriaxone and oral metronidazole.  He required readmission and aspiration of a new collection in the late April.  He was seen in the ED in mid May due to right lower quadrant swelling and discomfort, with CT showing subcutaneous edema.  Small to moderate right pleural effusion was noted and felt to be slightly larger in size compared to prior imaging.  It was felt that the effusion was not large enough to warrant thoracentesis.  Today, Mr. Klingensmith reports that he is gradually getting stronger following his hospitalizations this spring.  He does not have any chest pain, shortness of breath, palpitations, or edema.  He continues to have a firm area along his right flank pannus, though this seems to be softening a bit.  He and his other providers have noted some occasional low heart rates, dipping down into the 30s.  This prompted  Dr. [name] to decrease metoprolol from 25 mg daily to 12.5 mg daily.  --------------------------------------------------------------------------------------------------  Past Medical History:  Diagnosis Date   Adenomatous colon polyp    Aortic atherosclerosis (HCC)    Arthritis    Ascending aorta dilatation (HCC)    a.) TTE 11/23/2017: asc Ao measured 37 mm; b.) TTE 10/23/2021: Ao root measured 41 mm, asc Ao measured 38 mm; c.) TEE 10/28/2021: asc Ao measured 38 mm; d.) TTE 01/11/2022: Ao root 40 mm, asc Ao 39 mm   Ascending cholangitis 10/2021   BPH (benign prostatic hyperplasia)    Cardiomyopathy (HCC)    CKD (chronic kidney disease), stage III (HCC)    Claustrophobia    Coronary artery disease    a.) LHC 12/21/2017: 75% mLAD, 90% D1, 80-99% OM1/2, CTO pRCA (L-R collaterals) --> CVTS consult. 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   In setting of bacteremia from ascending colangitis   GERD (gastroesophageal reflux disease)    HFrEF (heart failure with reduced ejection fraction) (HCC)    a.) TTE 11/23/2017: EF 50-55%, mod MAC, triv TR, G1DD; b.) TTE 10/23/2021: EF 40-45%, mild LAE, Ao sclerosis, triv MR, G2DD; c.) TEE 10/28/2021: EF 40-45%, glob HK, mobile vegitation on MV; d.) TTE 01/11/2022: EF 50-55%, mild LVH, RVE, Ao sclerosis, mild MR/AR, G1DD   History of cholelithiasis    History of kidney stones    ca ox Vonita Moss @ [facility]) now [place]   History of pneumonia    HLD (hyperlipidemia)    HTN (hypertension)    Jaundice  age 6   Long term current use of anticoagulant    a.) apixaban   Paroxysmal atrial fibrillation (HCC)    a.) CHA2DS2VASc = 6 (age x 2, HFrEF, HTN, vascular disease history, T2DM);  b.) rate/rhythm maintained on oral metoprolol succinate; chronically anticoagulated with apixaban   Pneumonia    Right-sided carotid artery disease (HCC)    a.) carotid doppler 04/05/2022: 1-39% RICA   S/P CABG x 3     a.) LIMA-LAD, SVG-diagonal, SVG-PL branch of RCA   S/P cataract extraction and insertion of intraocular lens    T2DM (type 2 diabetes mellitus) (HCC) 2010   Past Surgical History:  Procedure Laterality Date   CARPAL TUNNEL RELEASE Bilateral    CATARACT EXTRACTION, BILATERAL     COLONOSCOPY  11/2012   12 adenomatous polyps, diverticulosis, rec rpt 1 yr [name])   COLONOSCOPY  12/2013   3 polyps, diverticulosis, rec rpt 3 yrs [name])   COLONOSCOPY  06/2019   6 polyps (TA), diverticulosis, f/u left open ended [name])   CORONARY ARTERY BYPASS GRAFT N/A 01/18/2018   Procedure: CORONARY ARTERY BYPASS GRAFTING (CABG) x 3; Using Left Internal Mammary Artery, and Right Greater Saphenous Vein harvested Endoscopically, Coronary Artery Endarterectomy;  Surgeon: Kerin Perna, MD;  Location: [facility] OR;  Service: Open Heart Surgery;  Laterality: N/A;   ERCP N/A 10/23/2021   Procedure: ENDOSCOPIC RETROGRADE CHOLANGIOPANCREATOGRAPHY (ERCP);  Surgeon: Meryl Dare, MD;  Location: [facility] ENDOSCOPY;  Service: Endoscopy;  Laterality: N/A;   IR CATHETER TUBE CHANGE  01/08/2023   IR RADIOLOGIST EVAL & MGMT  01/23/2023   IR THORACENTESIS ASP PLEURAL SPACE W/IMG GUIDE  12/29/2022   KNEE CARTILAGE SURGERY Left    LEFT HEART CATH AND CORONARY ANGIOGRAPHY N/A 12/21/2017   Procedure: LEFT HEART CATH AND CORONARY ANGIOGRAPHY;  Surgeon: Yvonne Kendall, MD;  Location: [facility] INVASIVE CV LAB;  Service: Cardiovascular;  Laterality: N/A;   LITHOTRIPSY     REMOVAL OF STONES  10/23/2021   Procedure: REMOVAL OF STONES;  Surgeon: Meryl Dare, MD;  Location: [facility] ENDOSCOPY;  Service: Endoscopy;;   SPHINCTEROTOMY  10/23/2021   Procedure: Dennison Mascot;  Surgeon: Meryl Dare, MD;  Location: [facility] ENDOSCOPY;  Service: Endoscopy;;   TEE WITHOUT CARDIOVERSION N/A 01/18/2018   Procedure: TRANSESOPHAGEAL ECHOCARDIOGRAM (TEE);  Surgeon: Donata Clay, Theron Arista, MD;  Location: [facility] OR;  Service: Open Heart Surgery;  Laterality: N/A;   TEE WITHOUT  CARDIOVERSION N/A 10/28/2021   Procedure: TRANSESOPHAGEAL ECHOCARDIOGRAM (TEE);  Surgeon: Chrystie Nose, MD;  Location: [facility] ENDOSCOPY;  Service: Cardiovascular;  Laterality: N/A;   UMBILICAL HERNIA REPAIR     with mesh    Current Meds  Medication Sig   acetaminophen (TYLENOL) 325 MG tablet Take 1 tablet (325 mg total) by mouth at bedtime as needed (sleep aid).   apixaban (ELIQUIS) 5 MG TABS tablet TAKE 1 TABLET BY MOUTH TWICE A DAY   Cholecalciferol (VITAMIN D3) 25 MCG (1000 UT) CAPS Take 1 capsule (1,000 Units total) by mouth daily.   Cyanocobalamin (B-12) 1000 MCG CAPS Take by mouth daily at 2 am.   feeding supplement (ENSURE ENLIVE / ENSURE PLUS) LIQD Take 237 mLs by mouth 2 (two) times daily between meals.   furosemide (LASIX) 40 MG tablet Take 0.5 tablets (20 mg total) by mouth daily.   glucose blood (ACCU-CHEK AVIVA PLUS) test strip E11.69 Use as instructed to check sugars dailyl   metoprolol succinate (TOPROL-XL) 25 MG 24 hr tablet Take 0.5 tablets (12.5 mg total) by  mouth daily. Take with or immediately following a meal.   mirtazapine (REMERON) 15 MG tablet TAKE 1 TABLET BY MOUTH EVERYDAY AT BEDTIME   Multiple Vitamins-Minerals (MACULAR VITAMIN BENEFIT PO) Take 1 tablet by mouth daily.   nitroGLYCERIN (NITROSTAT) 0.4 MG SL tablet Place 1 tablet (0.4 mg total) under the tongue every 5 (five) minutes as needed for chest pain.   rosuvastatin (CRESTOR) 10 MG tablet TAKE 1 TABLET BY MOUTH EVERY DAY   tamsulosin (FLOMAX) 0.4 MG CAPS capsule TAKE 1 CAPSULE BY MOUTH EVERY DAY    Allergies: Patient has no known allergies.  Social History   Tobacco Use   Smoking status: Never    Passive exposure: Past   Smokeless tobacco: Former    Types: Chew    Quit date: 09/08/2001  Vaping Use   Vaping Use: Never used  Substance Use Topics   Alcohol use: Not Currently    Comment: occ   Drug use: No    Family History  Problem Relation Age of Onset   Alzheimer's disease Mother    Breast  cancer Mother        breast   Atrial fibrillation Mother    CAD Mother    Stroke Father 69   Diabetes Father    CAD Father    Colon polyps Sister    Colon polyps Brother    Rectal cancer Maternal Grandfather        rectal   Colon cancer Maternal Grandfather 37   Esophageal cancer Neg Hx    Stomach cancer Neg Hx     Review of Systems: A 12-system review of systems was performed and was negative except as noted in the HPI.  --------------------------------------------------------------------------------------------------  Physical Exam: BP 129/67 (BP Location: Left Arm, Patient Position: Sitting, Cuff Size: Large)   Pulse 79   Ht 5\' 10"  (1.778 m)   Wt 222 lb 12.8 oz (101.1 kg)   SpO2 96%   BMI 31.97 kg/m   General:  NAD. Neck: No JVD or HJR. Lungs: Mild diminished breath sounds at the right base.  No wheezes or crackles. Heart: Regular rate and rhythm with frequent extrasystoles.  No murmurs, rubs, or gallops. Abdomen: Soft, nontender, nondistended.  Soft cutaneous mass noted in the right flank. Extremities: Trace tibial edema bilaterally.  EKG: Normal sinus rhythm with frequent PVCs and nonspecific ST/T abnormality.  Compared to prior tracing from 12/28/2022, PVCs are now present.  Lab Results  Component Value Date   WBC 4.7 01/21/2023   HGB 11.6 (L) 01/21/2023   HCT 38.6 (L) 01/21/2023   MCV 91.9 01/21/2023   PLT 261 01/21/2023    Lab Results  Component Value Date   NA 137 01/21/2023   K 3.9 01/21/2023   CL 106 01/21/2023   CO2 23 01/21/2023   BUN 10 01/21/2023   CREATININE 0.82 01/21/2023   GLUCOSE 159 (H) 01/21/2023   ALT 14 01/21/2023    Lab Results  Component Value Date   CHOL 76 06/20/2022   HDL 19.50 (L) 06/20/2022   LDLCALC 29 06/20/2022   LDLDIRECT 66.0 06/07/2021   TRIG 133.0 06/20/2022   CHOLHDL 4 06/20/2022    --------------------------------------------------------------------------------------------------  ASSESSMENT AND  PLAN: Coronary artery disease: No angina reported.  Continue current medications for secondary prevention.  Frequent PVCs: Recent reports of bradycardia are likely due to PVCs leading to under counting in heart rate.  Dr. [name] has drawn basic labs today including serum electrolytes, which are pending at the time of our  visit.  Prior magnesium level was noted to be a little low; I have recommended initiation of magnesium citrate 250 mg daily.  Continue metoprolol succinate 12.5 mg daily for now, though escalation in the future may actually be helpful in suppressing PVCs.  If PVCs persist or other symptoms of angina/worsening heart failure develop, repeat ischemia evaluation will need to be considered in the future.  Chronic HFrEF: Strangers has trace pedal edema today but otherwise appears euvolemic.  He is gradually gaining back weight due to improved nutrition after treatment of his hepatic abscess.  We will continue metoprolol succinate 12.5 mg daily and furosemide 20 mg daily.  Defer adding ACE inhibitor or ARB in the setting of soft blood pressure at times and occasional lightheadedness.  Paroxysmal atrial fibrillation: No evidence of recurrence.  Continue apixaban 5 mg twice daily in the setting of CHA2DS2-VASc score of at least 6.  Hepatic abscess: Drain removed and IV antibiotics completed.  Soft tissue collection still present in the right flank but softening per strangers report.  Defer ongoing management to infectious disease and Dr. [name].  Follow-up: Return to clinic in 6 weeks  Yvonne Kendall, MD 03/05/2023 3:56 PM

## 2023-03-05 NOTE — Assessment & Plan Note (Signed)
Weight gain noted - he notes appetite slowly improving

## 2023-03-05 NOTE — Assessment & Plan Note (Signed)
Update TFTs, latest TSH abnormal likely due to sick euthyroid.

## 2023-03-05 NOTE — Assessment & Plan Note (Signed)
Accuchek aviva plus strips sent in. Discussed transitioning to glucerna supplement in place of ensure.  Continues to hold metformin and farxiga.

## 2023-03-05 NOTE — Assessment & Plan Note (Signed)
Notes improved control on mirtazapine vs tylenol PM - continue this, stay off tylenol PM.

## 2023-03-05 NOTE — Progress Notes (Signed)
Ph: [phone] Fax: [phone]   Patient ID: [name] Young., male    DOB: 01/05/1941, 82 y.o.   MRN: [id number]  This visit was conducted in person.  BP 138/80 (BP Location: Right Arm, Cuff Size: Large)   Pulse (!) 58   Temp 97.8 F (36.6 C) (Temporal)   Ht 5\' 10"  (1.778 m)   Wt 222 lb (100.7 kg)   SpO2 98%   BMI 31.85 kg/m   BP Readings from Last 3 Encounters:  03/05/23 138/80  01/25/23 125/77  01/22/23 (!) 140/74   CC: 6 wk f/u visit  Subjective:   HPI: [name] Young. is a 82 y.o. male presenting on 03/05/2023 for Medical Management of Chronic Issues (Here for 6 wk follow up. PT accompanied by wife, [name]. )   See prior note for details.  Saw [facility] ID Dr Rivka Safer in f/u for hepatic abscess, liver drain removed 01/23/2023 and PICC line removed 02/01/2023. F/u PRN.   After last month's hospitalization, entresto, farxiga and lasix were all held due to low BP.  He notes Marcelline Deist previously was causing yeast infections.  [facility] home health ended.   Toprol XL 25mg  restarted last visit, continued eliquis for PAF.  Lasix 40mg  1/2 tab restarted last visit.  Upcoming appt with Dr End later today.   Notes slow improvement but ongoing fatigue - trouble walking out to shop and back due to leg weakness. No leg pain or cramping.  Weight gain noted, continues 1-2 Ensure/day. Notes appetite and taste is improving.  No leg swelling, no dyspnea, chest pain, no recent fevers/chills, abd pain.   Notes benefit with Remeron (mirtazapine) for sleep. Now off tylenol PM.      Relevant past medical, surgical, family and social history reviewed and updated as indicated. Interim medical history since our last visit reviewed. Allergies and medications reviewed and updated. Outpatient Medications Prior to Visit  Medication Sig Dispense Refill   acetaminophen (TYLENOL) 325 MG tablet Take 1 tablet (325 mg total) by mouth at bedtime as needed (sleep aid).     apixaban  (ELIQUIS) 5 MG TABS tablet TAKE 1 TABLET BY MOUTH TWICE A DAY 60 tablet 5   Cholecalciferol (VITAMIN D3) 25 MCG (1000 UT) CAPS Take 1 capsule (1,000 Units total) by mouth daily. 30 capsule    Cyanocobalamin (B-12) 1000 MCG CAPS Take by mouth daily at 2 am.     feeding supplement (ENSURE ENLIVE / ENSURE PLUS) LIQD Take 237 mLs by mouth 2 (two) times daily between meals. 14220 mL 0   furosemide (LASIX) 40 MG tablet Take 0.5 tablets (20 mg total) by mouth daily. 30 tablet 3   metoprolol succinate (TOPROL-XL) 25 MG 24 hr tablet Take 0.5 tablets (12.5 mg total) by mouth daily. Take with or immediately following a meal. 45 tablet 1   mirtazapine (REMERON) 15 MG tablet TAKE 1 TABLET BY MOUTH EVERYDAY AT BEDTIME 90 tablet 1   Multiple Vitamins-Minerals (MACULAR VITAMIN BENEFIT PO) Take 1 tablet by mouth daily.     nitroGLYCERIN (NITROSTAT) 0.4 MG SL tablet Place 1 tablet (0.4 mg total) under the tongue every 5 (five) minutes as needed for chest pain. 25 tablet 4   rosuvastatin (CRESTOR) 10 MG tablet TAKE 1 TABLET BY MOUTH EVERY DAY 90 tablet 3   tamsulosin (FLOMAX) 0.4 MG CAPS capsule TAKE 1 CAPSULE BY MOUTH EVERY DAY 90 capsule 3   liver oil-zinc oxide (DESITIN) 40 % ointment Apply topically 3 (three) times daily. 56.7  g 0   oxyCODONE-acetaminophen (PERCOCET/ROXICET) 5-325 MG tablet Take 1 tablet by mouth every 6 (six) hours as needed for moderate pain. 30 tablet 0   sodium chloride flush (NS) 0.9 % SOLN Inject 3 mLs into the vein every 12 (twelve) hours. 60 mL 0   No facility-administered medications prior to visit.     Per HPI unless specifically indicated in ROS section below Review of Systems  Objective:  BP 138/80 (BP Location: Right Arm, Cuff Size: Large)   Pulse (!) 58   Temp 97.8 F (36.6 C) (Temporal)   Ht 5\' 10"  (1.778 m)   Wt 222 lb (100.7 kg)   SpO2 98%   BMI 31.85 kg/m   Wt Readings from Last 3 Encounters:  03/05/23 222 lb (100.7 kg)  01/25/23 219 lb (99.3 kg)  01/22/23 221 lb  6 oz (100.4 kg)      Physical Exam Vitals and nursing note reviewed.  Constitutional:      Appearance: Normal appearance. He is not ill-appearing.  HENT:     Head: Normocephalic and atraumatic.     Mouth/Throat:     Mouth: Mucous membranes are moist.     Pharynx: Oropharynx is clear. No oropharyngeal exudate or posterior oropharyngeal erythema.  Eyes:     Extraocular Movements: Extraocular movements intact.     Pupils: Pupils are equal, round, and reactive to light.  Cardiovascular:     Rate and Rhythm: Normal rate and regular rhythm.     Pulses: Normal pulses.     Heart sounds: Normal heart sounds. No murmur heard. Pulmonary:     Effort: Pulmonary effort is normal. No respiratory distress.     Breath sounds: Normal breath sounds. No wheezing, rhonchi or rales.     Comments: Lungs clear Musculoskeletal:     Cervical back: Normal range of motion and neck supple.     Right lower leg: No edema.     Left lower leg: No edema.  Lymphadenopathy:     Cervical: No cervical adenopathy.  Skin:    General: Skin is warm and dry.     Findings: No rash.  Neurological:     Mental Status: He is alert.     Comments:  5/5 strength BLE, 4+/5 R hip flexors  Psychiatric:        Mood and Affect: Mood normal.        Behavior: Behavior normal.       Results for orders placed or performed during the hospital encounter of 01/21/23  CBC with Differential  Result Value Ref Range   WBC 4.7 4.0 - 10.5 K/uL   RBC 4.20 (L) 4.22 - 5.81 MIL/uL   Hemoglobin 11.6 (L) 13.0 - 17.0 g/dL   HCT 86.5 (L) 78.4 - 69.6 %   MCV 91.9 80.0 - 100.0 fL   MCH 27.6 26.0 - 34.0 pg   MCHC 30.1 30.0 - 36.0 g/dL   RDW 29.5 (H) 28.4 - 13.2 %   Platelets 261 150 - 400 K/uL   nRBC 0.0 0.0 - 0.2 %   Neutrophils Relative % 51 %   Neutro Abs 2.4 1.7 - 7.7 K/uL   Lymphocytes Relative 36 %   Lymphs Abs 1.7 0.7 - 4.0 K/uL   Monocytes Relative 5 %   Monocytes Absolute 0.3 0.1 - 1.0 K/uL   Eosinophils Relative 7 %    Eosinophils Absolute 0.3 0.0 - 0.5 K/uL   Basophils Relative 1 %   Basophils Absolute 0.0 0.0 - 0.1  K/uL   Immature Granulocytes 0 %   Abs Immature Granulocytes 0.01 0.00 - 0.07 K/uL  Comprehensive metabolic panel  Result Value Ref Range   Sodium 137 135 - 145 mmol/L   Potassium 3.9 3.5 - 5.1 mmol/L   Chloride 106 98 - 111 mmol/L   CO2 23 22 - 32 mmol/L   Glucose, Bld 159 (H) 70 - 99 mg/dL   BUN 10 8 - 23 mg/dL   Creatinine, Ser 1.47 0.61 - 1.24 mg/dL   Calcium 8.5 (L) 8.9 - 10.3 mg/dL   Total Protein 6.2 (L) 6.5 - 8.1 g/dL   Albumin 2.7 (L) 3.5 - 5.0 g/dL   AST 28 15 - 41 U/L   ALT 14 0 - 44 U/L   Alkaline Phosphatase 53 38 - 126 U/L   Total Bilirubin 0.3 0.3 - 1.2 mg/dL   GFR, Estimated >82 >95 mL/min   Anion gap 8 5 - 15   Lab Results  Component Value Date   IRON 84 05/20/2020   FERRITIN 38.5 05/20/2020    Lab Results  Component Value Date   HGBA1C 5.8 12/26/2022    Lab Results  Component Value Date   CKTOTAL 17 12/26/2022   Lab Results  Component Value Date   VITAMINB12 470 05/17/2022    Assessment & Plan:   Problem List Items Addressed This Visit     Type 2 diabetes mellitus with other specified complication (HCC)    Accuchek aviva plus strips sent in. Discussed transitioning to glucerna supplement in place of ensure.  Continues to hold metformin and farxiga.       Relevant Orders   Comprehensive metabolic panel   CBC with Differential/Platelet   CKD stage 3 due to type 2 diabetes mellitus (HCC)    Update kidney function, latest check reassuringly improved.       Obesity, Class I, BMI 30-34.9    Weight gain noted - he notes appetite slowly improving       Anemia    Update CBC.       Relevant Orders   Ferritin   IBC panel   Paroxysmal atrial fibrillation (HCC)    He is back on full dose eliquis and Toprol XL 12.5mg  daily.       Recurrent right pleural effusion    R lung overall clear - anticipate treatment of hepatic to have resolved  recurrent R pleural effusion issue.      Abnormal TSH    Update TFTs, latest TSH abnormal likely due to sick euthyroid.       Relevant Orders   TSH   T4, free   Hepatic abscess - Primary    Completed IV antibiotics and drain removed last month. ID f/u PRN.  Overall feeling well, just with residual weakness predominantly lower extremities.       Protein-calorie malnutrition, severe (HCC)    Update TP, albumin levels with more regular ensure use.  Continue remeron which he tolerates well.       Chronic diastolic CHF (congestive heart failure) (HCC)    Back on lasix 40mg  1/2 tab (20mg ) daily.  No pedal edema.  Pending cards f/u.       Insomnia    Notes improved control on mirtazapine vs tylenol PM - continue this, stay off tylenol PM.         Meds ordered this encounter  Medications   glucose blood (ACCU-CHEK AVIVA PLUS) test strip    Sig: E11.69 Use as instructed to check  sugars dailyl    Dispense:  100 each    Refill:  6    Orders Placed This Encounter  Procedures   Comprehensive metabolic panel   CBC with Differential/Platelet   TSH   T4, free   Ferritin   IBC panel    Patient Instructions  Labs today  Keep appointment with Dr End this afternoon.  Consider changing Ensure to Glucerna brand Good to see you today.  Return in 4 months for wellness visit/physical  Follow up plan: Return in about 4 months (around 07/05/2023) for annual exam, prior fasting for blood work, medicare wellness visit.  [name], MD

## 2023-03-05 NOTE — Patient Instructions (Addendum)
Labs today  Keep appointment with Dr End this afternoon.  Consider changing Ensure to Glucerna brand Good to see you today.  Return in 4 months for wellness visit/physical

## 2023-03-05 NOTE — Patient Instructions (Signed)
Medication Instructions:  Your physician recommends the following medication changes.  START TAKING: Magnesium Citrate 250 mg by mouth daily  *If you need a refill on your cardiac medications before your next appointment, please call your pharmacy*   Lab Work: None ordered today   Testing/Procedures: None ordered today   Follow-Up: At [facility], you and your health needs are our priority.  As part of our continuing mission to provide you with exceptional heart care, we have created designated Provider Care Teams.  These Care Teams include your primary Cardiologist (physician) and Advanced Practice Providers (APPs -  Physician Assistants and Nurse Practitioners) who all work together to provide you with the care you need, when you need it.  We recommend signing up for the patient portal called "MyChart".  Sign up information is provided on this After Visit Summary.  MyChart is used to connect with patients for Virtual Visits (Telemedicine).  Patients are able to view lab/test results, encounter notes, upcoming appointments, etc.  Non-urgent messages can be sent to your provider as well.   To learn more about what you can do with MyChart, go to [email or web address].    Your next appointment:   6 week(s)  Provider:   You may see Yvonne Kendall, MD or one of the following Advanced Practice Providers on your designated Care Team:   Nicolasa Ducking, NP Eula Listen, PA-C Cadence Fransico Michael, PA-C Charlsie Quest, NP

## 2023-03-05 NOTE — Assessment & Plan Note (Signed)
Update CBC. 

## 2023-03-05 NOTE — Assessment & Plan Note (Signed)
Completed IV antibiotics and drain removed last month. ID f/u PRN.  Overall feeling well, just with residual weakness predominantly lower extremities.

## 2023-03-06 ENCOUNTER — Encounter: Payer: Self-pay | Admitting: Family Medicine

## 2023-03-06 ENCOUNTER — Telehealth: Payer: Self-pay | Admitting: Family Medicine

## 2023-03-06 ENCOUNTER — Encounter: Payer: Self-pay | Admitting: Internal Medicine

## 2023-03-06 DIAGNOSIS — I493 Ventricular premature depolarization: Secondary | ICD-10-CM | POA: Insufficient documentation

## 2023-03-06 DIAGNOSIS — I5022 Chronic systolic (congestive) heart failure: Secondary | ICD-10-CM | POA: Insufficient documentation

## 2023-03-06 DIAGNOSIS — I5032 Chronic diastolic (congestive) heart failure: Secondary | ICD-10-CM | POA: Insufficient documentation

## 2023-03-06 DIAGNOSIS — E1169 Type 2 diabetes mellitus with other specified complication: Secondary | ICD-10-CM

## 2023-03-06 MED ORDER — TRUE METRIX BLOOD GLUCOSE TEST VI STRP
ORAL_STRIP | 3 refills | Status: AC
Start: 2023-03-06 — End: ?

## 2023-03-06 NOTE — Telephone Encounter (Signed)
Pt's wife, called to let Dr. Reece Agar know that True Metrix Blood Glucose Test Strips is covered by his insurance & asked if he could send in a rx for it? Call back # [phone]

## 2023-03-06 NOTE — Telephone Encounter (Signed)
E-scribed rx for True Metrix test strips to [facility]

## 2023-03-06 NOTE — Addendum Note (Signed)
Addended by: Nanci Pina on: 03/06/2023 04:48 PM   Modules accepted: Orders

## 2023-03-12 ENCOUNTER — Other Ambulatory Visit: Payer: Self-pay | Admitting: Internal Medicine

## 2023-03-12 DIAGNOSIS — I480 Paroxysmal atrial fibrillation: Secondary | ICD-10-CM

## 2023-03-12 NOTE — Telephone Encounter (Signed)
Prescription refill request for Eliquis received. Indication: Afib  Last office visit: 03/05/23 (End)  Scr: 0.93 (03/05/23)  Age: 82 Weight: 101.1kg  Appropriate dose. Refill sent.

## 2023-03-12 NOTE — Telephone Encounter (Signed)
Refill request

## 2023-03-29 ENCOUNTER — Ambulatory Visit: Payer: Medicare PPO | Admitting: Podiatry

## 2023-03-29 VITALS — BP 155/60

## 2023-03-29 DIAGNOSIS — M79674 Pain in right toe(s): Secondary | ICD-10-CM

## 2023-03-29 DIAGNOSIS — M79675 Pain in left toe(s): Secondary | ICD-10-CM | POA: Diagnosis not present

## 2023-03-29 DIAGNOSIS — B351 Tinea unguium: Secondary | ICD-10-CM | POA: Diagnosis not present

## 2023-03-29 DIAGNOSIS — E1142 Type 2 diabetes mellitus with diabetic polyneuropathy: Secondary | ICD-10-CM | POA: Diagnosis not present

## 2023-03-29 NOTE — Progress Notes (Unsigned)
  Subjective:  Patient ID: [name] Merl., male    DOB: 1941/03/18,  MRN: [id number]  [name] Merl. presents to clinic today for {jgcomplaint:23593}  Chief Complaint  Patient presents with   Nail Problem    DFC,Referring Provider [name], MD,lov:06/24,A!C:6.3      New problem(s): None. {jgcomplaint:23593}  PCP is [name], MD.  No Known Allergies  Review of Systems: Negative except as noted in the HPI.  Objective: No changes noted in today's physical examination. There were no vitals filed for this visit. [name] Deguia. is a pleasant 82 y.o. male {jgbodyhabitus:24098} AAO x 3.  Vascular Examination: CFT <3 seconds b/l. DP/PT pulses faintly palpable b/l. Skin temperature gradient warm to warm b/l. No pain with calf compression. No ischemia or gangrene. No cyanosis or clubbing noted b/l. {jgvascular:23595}   Neurological Examination: Sensation grossly intact b/l with 10 gram monofilament. Vibratory sensation intact b/l. {jgneuro:23601::"Protective sensation intact 5/5 intact bilaterally with 10g monofilament b/l.","Vibratory sensation intact b/l.","Proprioception intact bilaterally."}  Dermatological Examination: Pedal skin warm and supple b/l.   No open wounds. No interdigital macerations.  Toenails 1-5 b/l thick, discolored, elongated with subungual debris and pain on dorsal palpation.    {jgderm:23598}  Musculoskeletal Examination: Muscle strength 5/5 to b/l LE. {jgmsk:23600}  Radiographs: None  Xrays {jgPodToeLocator:23637}: {jgxrayfindings:23683}  Last A1c:      Latest Ref Rng & Units 12/26/2022    2:05 PM 06/20/2022    7:29 AM  Hemoglobin A1C  Hemoglobin-A1c 4.6 - 6.5 % 5.8  6.8    Assessment/Plan: 1. Pain due to onychomycosis of toenails of both feet   2. Diabetic polyneuropathy associated with type 2 diabetes mellitus (HCC)     No orders of the defined types were placed in this encounter.    None {Jgplan:23602::"-Patient/POA to call should there be question/concern in the interim."}   No follow-ups on file.  Freddie Breech, DPM

## 2023-04-05 ENCOUNTER — Ambulatory Visit: Payer: Self-pay

## 2023-04-05 NOTE — Patient Outreach (Signed)
  Care Coordination   Follow Up Visit Note   04/05/2023 Name: [name] Young. MRN: [id number] DOB: Jun 07, 1941  [name] Young. is a 82 y.o. year old male who sees [name] Boyden, MD for primary care. I  spoke with wife/ DPR, [name] Young.  What matters to the patients health and wellness today?  Wife states patient is improving. Wife states patients appetite has improved since taking the  mirtazapine. She states patient also feels it helps with his sleep.  Wife states home health services have ended. She states patient continues to do recommended PT exercises.  She states his fatigue / stamina appears to be improving some.  Wife states patient has had follow up with his primary provider and cardiologist.  Wife states primary provider decreased patients metoprolol due to the low pulse rates. She states patient's pulse has been within normal limits. Wife denies patient having any symptoms related to the hepatic abscess.    Goals Addressed             This Visit's Progress    Post hospital follow up / management of hepatic abcess and chronic health conditions       Interventions Today    Flowsheet Row Most Recent Value  Chronic Disease   Chronic disease during today's visit Other  [low pulse rate, status post hepatic abscess]  General Interventions   General Interventions Discussed/Reviewed General Interventions Reviewed, Doctor Visits  [evaluation of current treatment plan related to low pulse rate and status post hepatic abscess and patients adherence to plan as establishe by provider.  Assessed for ongoing low pulse rate and/ or symptoms related to hepatic abscess.]  Doctor Visits Discussed/Reviewed Doctor Visits Reviewed  [name] Young upcoming provider visits. Advised to keep follow up appointments with providers.]  Exercise Interventions   Exercise Discussed/Reviewed Physical Activity  [Advised to continue exercises provided by physical therapist to increase strength/  stamina]  Nutrition Interventions   Nutrition Discussed/Reviewed Nutrition Reviewed  [name] Young appetite]  Pharmacy Interventions   Pharmacy Dicussed/Reviewed Pharmacy Topics Reviewed  [medication adjustments discussed.  Compliance advised.]              SDOH assessments and interventions completed:  No     Care Coordination Interventions:  Yes, provided   Follow up plan: Follow up call scheduled for 05/22/23    Encounter Outcome:  Pt. Visit Completed   [name] Ina RN,BSN,CCM [facility] Care Coordination [phone] direct line

## 2023-04-05 NOTE — Patient Instructions (Signed)
Visit Information  Thank you for taking time to visit with me today. Please don't hesitate to contact me if I can be of assistance to you.   Following are the goals we discussed today:   Goals Addressed             This Visit's Progress    Post hospital follow up / management of hepatic abcess and chronic health conditions       Interventions Today    Flowsheet Row Most Recent Value  Chronic Disease   Chronic disease during today's visit Other  [low pulse rate, status post hepatic abscess]  General Interventions   General Interventions Discussed/Reviewed General Interventions Reviewed, Doctor Visits  [evaluation of current treatment plan related to low pulse rate and status post hepatic abscess and patients adherence to plan as establishe by provider.  Assessed for ongoing low pulse rate and/ or symptoms related to hepatic abscess.]  Doctor Visits Discussed/Reviewed Doctor Visits Reviewed  [name] upcoming provider visits. Advised to keep follow up appointments with providers.]  Exercise Interventions   Exercise Discussed/Reviewed Physical Activity  [Advised to continue exercises provided by physical therapist to increase strength/ stamina]  Nutrition Interventions   Nutrition Discussed/Reviewed Nutrition Reviewed  [name] appetite]  Pharmacy Interventions   Pharmacy Dicussed/Reviewed Pharmacy Topics Reviewed  [medication adjustments discussed.  Compliance advised.]              Our next appointment is by telephone on 05/22/23 at 2:30 pm  Please call the care guide team at [phone] if you need to cancel or reschedule your appointment.   If you are experiencing a Mental Health or Behavioral Health Crisis or need someone to talk to, please call the Suicide and Crisis Lifeline: 988 call 1-800-273-TALK (toll free, 24 hour hotline)  Patient verbalizes understanding of instructions and care plan provided today and agrees to view in MyChart. Active MyChart status and  patient understanding of how to access instructions and care plan via MyChart confirmed with patient.     George Ina RN,BSN,CCM [facility] Care Coordination [phone] direct line

## 2023-04-23 DIAGNOSIS — E119 Type 2 diabetes mellitus without complications: Secondary | ICD-10-CM | POA: Diagnosis not present

## 2023-04-23 DIAGNOSIS — H353131 Nonexudative age-related macular degeneration, bilateral, early dry stage: Secondary | ICD-10-CM | POA: Diagnosis not present

## 2023-04-23 DIAGNOSIS — Z9841 Cataract extraction status, right eye: Secondary | ICD-10-CM | POA: Diagnosis not present

## 2023-04-23 DIAGNOSIS — H52221 Regular astigmatism, right eye: Secondary | ICD-10-CM | POA: Diagnosis not present

## 2023-04-23 DIAGNOSIS — Z9842 Cataract extraction status, left eye: Secondary | ICD-10-CM | POA: Diagnosis not present

## 2023-04-23 LAB — HM DIABETES EYE EXAM

## 2023-04-25 ENCOUNTER — Ambulatory Visit: Payer: Medicare PPO | Admitting: Family Medicine

## 2023-04-25 ENCOUNTER — Ambulatory Visit (INDEPENDENT_AMBULATORY_CARE_PROVIDER_SITE_OTHER)
Admission: RE | Admit: 2023-04-25 | Discharge: 2023-04-25 | Disposition: A | Discharge status: Home / Self Care | Payer: Medicare PPO | Source: Ambulatory Visit | Attending: Family Medicine | Admitting: Family Medicine

## 2023-04-25 ENCOUNTER — Encounter: Payer: Self-pay | Admitting: Family Medicine

## 2023-04-25 ENCOUNTER — Ambulatory Visit: Payer: Medicare PPO | Admitting: Nurse Practitioner

## 2023-04-25 VITALS — BP 130/70 | HR 40 | Temp 98.3°F | Ht 70.0 in | Wt 230.4 lb

## 2023-04-25 DIAGNOSIS — J90 Pleural effusion, not elsewhere classified: Secondary | ICD-10-CM | POA: Diagnosis not present

## 2023-04-25 DIAGNOSIS — K750 Abscess of liver: Secondary | ICD-10-CM

## 2023-04-25 DIAGNOSIS — I493 Ventricular premature depolarization: Secondary | ICD-10-CM

## 2023-04-25 DIAGNOSIS — N183 Chronic kidney disease, stage 3 unspecified: Secondary | ICD-10-CM

## 2023-04-25 DIAGNOSIS — R0602 Shortness of breath: Secondary | ICD-10-CM | POA: Diagnosis not present

## 2023-04-25 DIAGNOSIS — R052 Subacute cough: Secondary | ICD-10-CM

## 2023-04-25 DIAGNOSIS — K5900 Constipation, unspecified: Secondary | ICD-10-CM

## 2023-04-25 DIAGNOSIS — R918 Other nonspecific abnormal finding of lung field: Secondary | ICD-10-CM | POA: Diagnosis not present

## 2023-04-25 DIAGNOSIS — I350 Nonrheumatic aortic (valve) stenosis: Secondary | ICD-10-CM | POA: Diagnosis not present

## 2023-04-25 DIAGNOSIS — R531 Weakness: Secondary | ICD-10-CM | POA: Insufficient documentation

## 2023-04-25 DIAGNOSIS — N200 Calculus of kidney: Secondary | ICD-10-CM | POA: Diagnosis not present

## 2023-04-25 DIAGNOSIS — I480 Paroxysmal atrial fibrillation: Secondary | ICD-10-CM

## 2023-04-25 DIAGNOSIS — R001 Bradycardia, unspecified: Secondary | ICD-10-CM | POA: Diagnosis not present

## 2023-04-25 DIAGNOSIS — I5032 Chronic diastolic (congestive) heart failure: Secondary | ICD-10-CM | POA: Diagnosis not present

## 2023-04-25 DIAGNOSIS — R059 Cough, unspecified: Secondary | ICD-10-CM | POA: Diagnosis not present

## 2023-04-25 DIAGNOSIS — E1122 Type 2 diabetes mellitus with diabetic chronic kidney disease: Secondary | ICD-10-CM | POA: Diagnosis not present

## 2023-04-25 MED ORDER — DOXYCYCLINE HYCLATE 100 MG PO TABS: 100.0000 mg | ORAL_TABLET | Freq: Two times a day (BID) | ORAL | 0 refills | Status: DC

## 2023-04-25 MED ORDER — AMOXICILLIN-POT CLAVULANATE 875-125 MG PO TABS: 1.0000 | ORAL_TABLET | Freq: Two times a day (BID) | ORAL | 0 refills | Status: AC

## 2023-04-25 NOTE — Assessment & Plan Note (Signed)
Suspect recurrent R pleural effusion - update CXR today.  Read as small pleural effusion on right as well as possible infiltrate RLL.  Will treat with doxycycline + augmentin 7d course. Await labwork.  Update if not improving with treatment.

## 2023-04-25 NOTE — Assessment & Plan Note (Signed)
Suspect constipation - check KUB r/o obstruction pattern. Rec increase fluids, increase fiber in diet, add miralax 1/2 capful daily.  If pain worsening, discussed change to bowel rest with clear liquid diet.  Update with effect.

## 2023-04-25 NOTE — Assessment & Plan Note (Addendum)
Weight gain noted without increased pedal edema. Check BNP. Increase lasix to 40mg  daily x3d then return to 20mg  daily.

## 2023-04-25 NOTE — Patient Instructions (Addendum)
EKG today Xrays today  Labs today  I want you to increase lasix to whole tablet daily for 3 days.  I have sent antibiotic doxycycline 1 week course to your pharmacy to take today.  For constipation - try prunes, kiwi, prune and apple juice. May take miralax 1/21 capful daily with large glass of water. If worsening pain, stop solids, change to clear liquid diet (water, jello, chicken broth, gatorade).  Let us know if not improving with this treatment.

## 2023-04-25 NOTE — Assessment & Plan Note (Signed)
Update EKG as per above.

## 2023-04-25 NOTE — Progress Notes (Signed)
Ph: [phone] Fax: [phone]   Patient ID: [name] Young., male    DOB: 03/03/1941, 82 y.o.   MRN: [id number]  This visit was conducted in person.  BP 130/70   Pulse (!) 40   Temp 98.3 F (36.8 C) (Temporal)   Ht 5\' 10"  (1.778 m)   Wt 230 lb 6 oz (104.5 kg)   SpO2 95%   BMI 33.06 kg/m   Pulse Readings from Last 3 Encounters:  04/25/23 (!) 40  03/05/23 79  03/05/23 (!) 58    CC: cough with chest congestion Subjective:   HPI: [name] Young. is a 82 y.o. male presenting on 04/25/2023 for Cough (C/o cough, chest congestion, fatigue and constipation. Sxs started about 4 mos ago but worse in past 2 wks ago. Pt accompanied by wife, [name]. )   Months of ongoing productive cough of clear mucous, congestion, fatigue but acutely worse in the past 2 weeks. Coughing fits lead to gagging. Cough syrup not helping.   Wife had COVID 2 wks ago - she is feeling better. He tested for COVID x2 at home and both times negative.   No fevers/chills, chest pain, ear or tooth pain, ST, PNdrainage. No GERD or heartburn. No palpitations. No nausea/vomiting.   Constipation - no BM in the past 3 days. Prior this normal. Not passing gas well. No abd pain, nausea/vomiting. Appetite fair. No change in diet. No new medicines, vitamins or supplements.   He continues lasix 20mg  daily. He is on eliquis.      Relevant past medical, surgical, family and social history reviewed and updated as indicated. Interim medical history since our last visit reviewed. Allergies and medications reviewed and updated. Outpatient Medications Prior to Visit  Medication Sig Dispense Refill   acetaminophen (TYLENOL) 325 MG tablet Take 1 tablet (325 mg total) by mouth at bedtime as needed (sleep aid).     Cholecalciferol (VITAMIN D3) 25 MCG (1000 UT) CAPS Take 1 capsule (1,000 Units total) by mouth daily. 30 capsule    Cyanocobalamin (B-12) 1000 MCG CAPS Take by mouth daily at 2 am.     ELIQUIS 5 MG  TABS tablet TAKE 1 TABLET BY MOUTH TWICE A DAY 60 tablet 5   feeding supplement (ENSURE ENLIVE / ENSURE PLUS) LIQD Take 237 mLs by mouth 2 (two) times daily between meals. 14220 mL 0   furosemide (LASIX) 40 MG tablet Take 0.5 tablets (20 mg total) by mouth daily. 30 tablet 3   glucose blood (TRUE METRIX BLOOD GLUCOSE TEST) test strip Use as instructed to check blood sugar once a day 100 each 3   Magnesium Citrate 125 MG CAPS Take 250 mg by mouth daily.     metoprolol succinate (TOPROL-XL) 25 MG 24 hr tablet Take 0.5 tablets (12.5 mg total) by mouth daily. Take with or immediately following a meal. 45 tablet 1   mirtazapine (REMERON) 15 MG tablet TAKE 1 TABLET BY MOUTH EVERYDAY AT BEDTIME 90 tablet 1   Multiple Vitamins-Minerals (MACULAR VITAMIN BENEFIT PO) Take 1 tablet by mouth daily.     nitroGLYCERIN (NITROSTAT) 0.4 MG SL tablet Place 1 tablet (0.4 mg total) under the tongue every 5 (five) minutes as needed for chest pain. 25 tablet 4   rosuvastatin (CRESTOR) 10 MG tablet TAKE 1 TABLET BY MOUTH EVERY DAY 90 tablet 3   tamsulosin (FLOMAX) 0.4 MG CAPS capsule TAKE 1 CAPSULE BY MOUTH EVERY DAY 90 capsule 3   No facility-administered medications prior  to visit.     Per HPI unless specifically indicated in ROS section below Review of Systems  Objective:  BP 130/70   Pulse (!) 40   Temp 98.3 F (36.8 C) (Temporal)   Ht 5\' 10"  (1.778 m)   Wt 230 lb 6 oz (104.5 kg)   SpO2 95%   BMI 33.06 kg/m   Wt Readings from Last 3 Encounters:  04/25/23 230 lb 6 oz (104.5 kg)  03/05/23 222 lb 12.8 oz (101.1 kg)  03/05/23 222 lb (100.7 kg)      Physical Exam Vitals and nursing note reviewed.  Constitutional:      Appearance: Normal appearance. He is ill-appearing.     Comments: Tired, fatigued slow to get on exam table. Orthopnea present  Cardiovascular:     Rate and Rhythm: Bradycardia present.     Pulses: Normal pulses.     Heart sounds: Murmur (3/6 systolic throughout) heard.  Pulmonary:      Effort: Pulmonary effort is normal. No respiratory distress.     Breath sounds: Rales (RLL) present. No wheezing or rhonchi.  Abdominal:     General: Bowel sounds are decreased. There is no distension.     Palpations: Abdomen is soft. There is no mass.     Tenderness: There is no abdominal tenderness. There is no guarding or rebound. Negative signs include Murphy's sign.     Hernia: No hernia is present.  Musculoskeletal:     Right lower leg: No edema.     Left lower leg: No edema.  Skin:    General: Skin is warm and dry.     Findings: No rash.  Neurological:     Mental Status: He is alert.  Psychiatric:        Mood and Affect: Mood normal.        Behavior: Behavior normal.       Results for orders placed or performed in visit on 04/24/23  HM DIABETES EYE EXAM  Result Value Ref Range   HM Diabetic Eye Exam No Retinopathy No Retinopathy   DG Abd Acute W/Chest CLINICAL DATA:  Shortness of breath and cough.  EXAM: DG ABDOMEN ACUTE WITH 1 VIEW CHEST  COMPARISON:  Chest radiograph dated 01/01/2023.  FINDINGS: Small right pleural effusion and right lung base atelectasis or infiltrate, new since the prior radiograph. The left lung is clear. No pneumothorax. Stable cardiac silhouette. Median sternotomy wires and CABG vascular clips. Atherosclerotic calcification of the aorta.  Moderate stool throughout the colon. No bowel dilatation or evidence of obstruction. No free air. A 6 mm right renal interpolar calculus. Degenerative changes of the spine. No acute osseous pathology.  IMPRESSION: 1. Small right pleural effusion and right lung base atelectasis or infiltrate. 2. Nonobstructive bowel gas pattern. 3. Right renal calculus.  Electronically Signed   By: Elgie Collard M.D.   On: 04/25/2023 15:51  EKG - sinus rhythm with rate 80s, normal axis, intervals, no hypertrophy or acute ST/T changes, frequent PVCs.   Assessment & Plan:   Problem List Items Addressed This  Visit     CKD stage 3 due to type 2 diabetes mellitus (HCC)    Update kidney function      Constipation    Suspect constipation - check KUB r/o obstruction pattern. Rec increase fluids, increase fiber in diet, add miralax 1/2 capful daily.  If pain worsening, discussed change to bowel rest with clear liquid diet.  Update with effect.      Relevant Orders  DG Abd Acute W/Chest (Completed)   Paroxysmal atrial fibrillation (HCC)    Continues Eliquis and Toprol XL 12.5mg  daily      Subacute cough - Primary    Suspect recurrent R pleural effusion - update CXR today.  Read as small pleural effusion on right as well as possible infiltrate RLL.  Will treat with doxycycline + augmentin 7d course. Await labwork.  Update if not improving with treatment.       Relevant Orders   DG Abd Acute W/Chest (Completed)   CBC with Differential/Platelet   Comprehensive metabolic panel   Brain natriuretic peptide   Sedimentation rate   Recurrent right pleural effusion    Update CXR.       Hepatic abscess    Update CBC, ESR, LFTs.  Low threshold to update abdominal imaging.       Moderate aortic stenosis    Again heard today      Chronic diastolic CHF (congestive heart failure) (HCC)    Weight gain noted without increased pedal edema. Check BNP. Increase lasix to 40mg  daily x3d then return to 20mg  daily.       Frequent PVCs    Update EKG as per above.        Bradycardia    Update EKG > falsely low pulse due to PVCs       Relevant Orders   EKG 12-Lead (Completed)   General weakness    Worsening - see above re: workup        Meds ordered this encounter  Medications   doxycycline (VIBRA-TABS) 100 MG tablet    Sig: Take 1 tablet (100 mg total) by mouth 2 (two) times daily.    Dispense:  14 tablet    Refill:  0   amoxicillin-clavulanate (AUGMENTIN) 875-125 MG tablet    Sig: Take 1 tablet by mouth 2 (two) times daily for 7 days.    Dispense:  14 tablet    Refill:  0     Orders Placed This Encounter  Procedures   DG Abd Acute W/Chest    Standing Status:   Future    Number of Occurrences:   1    Standing Expiration Date:   04/24/2024    Order Specific Question:   Reason for Exam (SYMPTOM  OR DIAGNOSIS REQUIRED)    Answer:   cough, dyspnea, constipation    Order Specific Question:   Preferred imaging location?    Answer:   [facility]   CBC with Differential/Platelet   Comprehensive metabolic panel   Brain natriuretic peptide   Sedimentation rate   EKG 12-Lead    Patient Instructions  EKG today Xrays today  Labs today  I want you to increase lasix to whole tablet daily for 3 days.  I have sent antibiotic doxycycline 1 week course to your pharmacy to take today.  For constipation - try prunes, kiwi, prune and apple juice. May take miralax 1/21 capful daily with large glass of water. If worsening pain, stop solids, change to clear liquid diet (water, jello, chicken broth, gatorade).  Let us know if not improving with this treatment.   Follow up plan: Return if symptoms worsen or fail to improve.  [name], MD

## 2023-04-25 NOTE — Assessment & Plan Note (Addendum)
Update CBC, ESR, LFTs.  Low threshold to update abdominal imaging.

## 2023-04-25 NOTE — Assessment & Plan Note (Signed)
Update CXR.

## 2023-04-25 NOTE — Assessment & Plan Note (Addendum)
Continues Eliquis and Toprol XL 12.5mg  daily

## 2023-04-25 NOTE — Assessment & Plan Note (Signed)
Update EKG > falsely low pulse due to PVCs

## 2023-04-25 NOTE — Assessment & Plan Note (Signed)
Again heard today.  

## 2023-04-25 NOTE — Assessment & Plan Note (Signed)
Update kidney function.  

## 2023-04-25 NOTE — Assessment & Plan Note (Signed)
Worsening - see above re: workup

## 2023-04-26 LAB — COMPREHENSIVE METABOLIC PANEL
ALT: 13 U/L (ref 0–53)
AST: 21 U/L (ref 0–37)
Albumin: 3.7 g/dL (ref 3.5–5.2)
Alkaline Phosphatase: 60 U/L (ref 39–117)
BUN: 28 mg/dL — ABNORMAL HIGH (ref 6–23)
CO2: 29 meq/L (ref 19–32)
Calcium: 9 mg/dL (ref 8.4–10.5)
Chloride: 100 meq/L (ref 96–112)
Creatinine, Ser: 1.21 mg/dL (ref 0.40–1.50)
GFR: 56.42 mL/min — ABNORMAL LOW (ref >60.00)
Glucose, Bld: 108 mg/dL — ABNORMAL HIGH (ref 70–99)
Potassium: 4.4 meq/L (ref 3.5–5.1)
Sodium: 138 meq/L (ref 135–145)
Total Bilirubin: 0.6 mg/dL (ref 0.2–1.2)
Total Protein: 7.3 g/dL (ref 6.0–8.3)

## 2023-04-26 LAB — CBC WITH DIFFERENTIAL/PLATELET
Basophils Absolute: 0.1 10*3/uL (ref 0.0–0.1)
Basophils Relative: 0.8 % (ref 0.0–3.0)
Eosinophils Absolute: 0.1 10*3/uL (ref 0.0–0.7)
Eosinophils Relative: 2.2 % (ref 0.0–5.0)
HCT: 36.6 % — ABNORMAL LOW (ref 39.0–52.0)
Hemoglobin: 11.9 g/dL — ABNORMAL LOW (ref 13.0–17.0)
Lymphocytes Relative: 27.5 % (ref 12.0–46.0)
Lymphs Abs: 1.7 10*3/uL (ref 0.7–4.0)
MCHC: 32.5 g/dL (ref 30.0–36.0)
MCV: 90.8 fl (ref 78.0–100.0)
Monocytes Absolute: 0.7 10*3/uL (ref 0.1–1.0)
Monocytes Relative: 11.2 % (ref 3.0–12.0)
Neutro Abs: 3.6 10*3/uL (ref 1.4–7.7)
Neutrophils Relative %: 58.3 % (ref 43.0–77.0)
Platelets: 232 10*3/uL (ref 150.0–400.0)
RBC: 4.03 Mil/uL — ABNORMAL LOW (ref 4.22–5.81)
RDW: 14.9 % (ref 11.5–15.5)
WBC: 6.1 10*3/uL (ref 4.0–10.5)

## 2023-04-26 LAB — SEDIMENTATION RATE: Sed Rate: 65 mm/h — ABNORMAL HIGH (ref 0–20)

## 2023-04-26 LAB — BRAIN NATRIURETIC PEPTIDE: Pro B Natriuretic peptide (BNP): 142 pg/mL — ABNORMAL HIGH (ref 0.0–100.0)

## 2023-05-08 ENCOUNTER — Ambulatory Visit: Payer: Medicare PPO | Admitting: Cardiovascular Disease

## 2023-05-09 ENCOUNTER — Encounter: Payer: Self-pay | Admitting: Pulmonary Disease

## 2023-05-09 ENCOUNTER — Ambulatory Visit: Payer: Medicare PPO | Admitting: Pulmonary Disease

## 2023-05-09 VITALS — BP 112/60 | HR 43 | Temp 97.8°F | Ht 70.0 in | Wt 232.2 lb

## 2023-05-09 DIAGNOSIS — J90 Pleural effusion, not elsewhere classified: Secondary | ICD-10-CM | POA: Diagnosis not present

## 2023-05-09 NOTE — Patient Instructions (Signed)
CT scan of the chest without contrast will be ordered to assess for the recurrent pleural effusion  If there is significant fluid buildup, we may need drainage  Graded activities as tolerated -Try and spend some time walking on a regular basis  Call us with any significant concerns  Follow-up in 3 months

## 2023-05-09 NOTE — Progress Notes (Signed)
[name] Young    [id number]    [birth year]  Primary Care Physician:Gutierrez, Wynona Canes, MD  Referring Physician: [name], MD [address] [place],  [place] [address]  Chief complaint:   Patient being seen for subacute cough  HPI:  Patient with subacute cough Symptoms did improve but recently had an exacerbation for which he followed up with his primary doctor Was treated with a course of doxycycline and Augmentin to which he responded well Cough did improve Sputum did get better  Not having any chest pains or chest discomfort No fever, no chills  Remote history of smoking cigars here and there, was never really a smoker  No significant occupational exposure worked outdoors, worked for [employer] for many years  History of hypertension, diabetes, hypercholesterolemia, coronary artery disease for which he had bypass in 2018  Associated with the cough is more shortness of breath, has felt more limited with activities of daily living, decreased usual routine  Was evaluated in the emergency department/urgent care Recent chest x-ray showing right pleural effusion  Was recently treated with a course of azithromycin that did not really help Benzonatate does not help the cough  Denies any significant leg swelling  Outpatient Encounter Medications as of 05/09/2023  Medication Sig   acetaminophen (TYLENOL) 325 MG tablet Take 1 tablet (325 mg total) by mouth at bedtime as needed (sleep aid).   Cholecalciferol (VITAMIN D3) 25 MCG (1000 UT) CAPS Take 1 capsule (1,000 Units total) by mouth daily.   Cyanocobalamin (B-12) 1000 MCG CAPS Take by mouth daily at 2 am.   doxycycline (VIBRA-TABS) 100 MG tablet Take 1 tablet (100 mg total) by mouth 2 (two) times daily.   ELIQUIS 5 MG TABS tablet TAKE 1 TABLET BY MOUTH TWICE A DAY   feeding supplement (ENSURE ENLIVE / ENSURE PLUS) LIQD Take 237 mLs by mouth 2 (two) times daily between meals.   furosemide (LASIX) 40  MG tablet Take 0.5 tablets (20 mg total) by mouth daily.   glucose blood (TRUE METRIX BLOOD GLUCOSE TEST) test strip Use as instructed to check blood sugar once a day   Magnesium Citrate 125 MG CAPS Take 250 mg by mouth daily.   metoprolol succinate (TOPROL-XL) 25 MG 24 hr tablet Take 0.5 tablets (12.5 mg total) by mouth daily. Take with or immediately following a meal.   mirtazapine (REMERON) 15 MG tablet TAKE 1 TABLET BY MOUTH EVERYDAY AT BEDTIME   Multiple Vitamins-Minerals (MACULAR VITAMIN BENEFIT PO) Take 1 tablet by mouth daily.   nitroGLYCERIN (NITROSTAT) 0.4 MG SL tablet Place 1 tablet (0.4 mg total) under the tongue every 5 (five) minutes as needed for chest pain.   rosuvastatin (CRESTOR) 10 MG tablet TAKE 1 TABLET BY MOUTH EVERY DAY   tamsulosin (FLOMAX) 0.4 MG CAPS capsule TAKE 1 CAPSULE BY MOUTH EVERY DAY   No facility-administered encounter medications on file as of 05/09/2023.    Allergies as of 05/09/2023   (No Known Allergies)    Past Medical History:  Diagnosis Date   Adenomatous colon polyp    Aortic atherosclerosis (HCC)    Arthritis    Ascending aorta dilatation (HCC)    a.) TTE 11/23/2017: asc Ao measured 37 mm; b.) TTE 10/23/2021: Ao root measured 41 mm, asc Ao measured 38 mm; c.) TEE 10/28/2021: asc Ao measured 38 mm; d.) TTE 01/11/2022: Ao root 40 mm, asc Ao 39 mm   Ascending cholangitis 10/2021   BPH (  benign prostatic hyperplasia)    Cardiomyopathy (HCC)    CKD (chronic kidney disease), stage III (HCC)    Claustrophobia    Coronary artery disease    a.) LHC 12/21/2017: 75% mLAD, 90% D1, 80-99% OM1/2, CTO pRCA (L-R collaterals) --> CVTS consult. 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   In setting of bacteremia from ascending colangitis   GERD (gastroesophageal reflux disease)    HFrEF (heart failure with reduced ejection fraction) (HCC)    a.) TTE 11/23/2017: EF 50-55%, mod MAC, triv TR, G1DD; b.) TTE  10/23/2021: EF 40-45%, mild LAE, Ao sclerosis, triv MR, G2DD; c.) TEE 10/28/2021: EF 40-45%, glob HK, mobile vegitation on MV; d.) TTE 01/11/2022: EF 50-55%, mild LVH, RVE, Ao sclerosis, mild MR/AR, G1DD   History of cholelithiasis    History of kidney stones    ca ox Vonita Moss @ [facility]) now [employer]   History of pneumonia    HLD (hyperlipidemia)    HTN (hypertension)    Jaundice    age 50   Long term current use of anticoagulant    a.) apixaban   Paroxysmal atrial fibrillation (HCC)    a.) CHA2DS2VASc = 6 (age x 2, HFrEF, HTN, vascular disease history, T2DM);  b.) rate/rhythm maintained on oral metoprolol succinate; chronically anticoagulated with apixaban   Pneumonia    Right-sided carotid artery disease (HCC)    a.) carotid doppler 04/05/2022: 1-39% RICA   S/P CABG x 3    a.) LIMA-LAD, SVG-diagonal, SVG-PL branch of RCA   S/P cataract extraction and insertion of intraocular lens    T2DM (type 2 diabetes mellitus) (HCC) 2010    Past Surgical History:  Procedure Laterality Date   CARPAL TUNNEL RELEASE Bilateral    CATARACT EXTRACTION, BILATERAL     COLONOSCOPY  11/2012   11 adenomatous polyps, diverticulosis, rec rpt 1 yr [name])   COLONOSCOPY  12/2013   3 polyps, diverticulosis, rec rpt 3 yrs [name])   COLONOSCOPY  06/2019   6 polyps (TA), diverticulosis, f/u left open ended [name])   CORONARY ARTERY BYPASS GRAFT N/A 01/18/2018   Procedure: CORONARY ARTERY BYPASS GRAFTING (CABG) x 3; Using Left Internal Mammary Artery, and Right Greater Saphenous Vein harvested Endoscopically, Coronary Artery Endarterectomy;  Surgeon: Kerin Perna, MD;  Location: [facility] OR;  Service: Open Heart Surgery;  Laterality: N/A;   ERCP N/A 10/23/2021   Procedure: ENDOSCOPIC RETROGRADE CHOLANGIOPANCREATOGRAPHY (ERCP);  Surgeon: Meryl Dare, MD;  Location: [facility] ENDOSCOPY;  Service: Endoscopy;  Laterality: N/A;   IR CATHETER TUBE CHANGE  01/08/2023   IR RADIOLOGIST EVAL & MGMT  01/23/2023   IR  THORACENTESIS ASP PLEURAL SPACE W/IMG GUIDE  12/29/2022   KNEE CARTILAGE SURGERY Left    LEFT HEART CATH AND CORONARY ANGIOGRAPHY N/A 12/21/2017   Procedure: LEFT HEART CATH AND CORONARY ANGIOGRAPHY;  Surgeon: Yvonne Kendall, MD;  Location: [facility] INVASIVE CV LAB;  Service: Cardiovascular;  Laterality: N/A;   LITHOTRIPSY     REMOVAL OF STONES  10/23/2021   Procedure: REMOVAL OF STONES;  Surgeon: Meryl Dare, MD;  Location: [facility] ENDOSCOPY;  Service: Endoscopy;;   SPHINCTEROTOMY  10/23/2021   Procedure: Dennison Mascot;  Surgeon: Meryl Dare, MD;  Location[facility] ENDOSCOPY;  Service: Endoscopy;;   TEE WITHOUT CARDIOVERSION N/A 01/18/2018   Procedure: TRANSESOPHAGEAL ECHOCARDIOGRAM (TEE);  Surgeon: Donata Clay, Theron Arista, MD;  Location: [facility] OR;  Service: Open Heart Surgery;  Laterality: N/A;   TEE  WITHOUT CARDIOVERSION N/A 10/28/2021   Procedure: TRANSESOPHAGEAL ECHOCARDIOGRAM (TEE);  Surgeon: Chrystie Nose, MD;  Location: [facility] ENDOSCOPY;  Service: Cardiovascular;  Laterality: N/A;   UMBILICAL HERNIA REPAIR     with mesh    Family History  Problem Relation Age of Onset   Alzheimer's disease Mother    Breast cancer Mother        breast   Atrial fibrillation Mother    CAD Mother    Stroke Father 23   Diabetes Father    CAD Father    Colon polyps Sister    Colon polyps Brother    Rectal cancer Maternal Grandfather        rectal   Colon cancer Maternal Grandfather 88   Esophageal cancer Neg Hx    Stomach cancer Neg Hx     Social History   Socioeconomic History   Marital status: Married    Spouse name: Not on file   Number of children: Not on file   Years of education: Not on file   Highest education level: 12th grade  Occupational History   Not on file  Tobacco Use   Smoking status: Never    Passive exposure: Past   Smokeless tobacco: Former    Types: Chew    Quit date: 09/08/2001  Vaping Use   Vaping status: Never Used  Substance and Sexual Activity   Alcohol use: Not Currently     Comment: occ   Drug use: No   Sexual activity: Not Currently  Other Topics Concern   Not on file  Social History Narrative   Caffeine: occasional Lives with wife, grandson [name] 2009)Occupation: retired, 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.Diet: some water, fruits/vegetables daily, avoids potatoes   Social Determinants of Health   Financial Resource Strain: Low Risk  (12/26/2022)   Overall Financial Resource Strain (CARDIA)    Difficulty of Paying Living Expenses: Not hard at all  Food Insecurity: Unknown (01/19/2023)   Hunger Vital Sign    Worried About Running Out of Food in the Last Year: Never true    Ran Out of Food in the Last Year: Patient declined  Transportation Needs: No Transportation Needs (01/19/2023)   PRAPARE - Administrator, Civil Service (Medical): No    Lack of Transportation (Non-Medical): No  Physical Activity: Unknown (12/26/2022)   Exercise Vital Sign    Days of Exercise per Week: Patient declined    Minutes of Exercise per Session: Not on file  Stress: No Stress Concern Present (12/26/2022)   [employer] of Occupational Health - Occupational Stress Questionnaire    Feeling of Stress : Not at all  Social Connections: Unknown (12/26/2022)   Social Connection and Isolation Panel [NHANES]    Frequency of Communication with Friends and Family: Patient declined    Frequency of Social Gatherings with Friends and Family: Patient declined    Attends Religious Services: Patient declined    Database administrator or Organizations: Yes    Attends Banker Meetings: Patient declined    Marital Status: Married  Catering manager Violence: Not on file    Review of Systems  Constitutional:  Positive for fatigue.  Respiratory:  Positive for cough and shortness of breath.     There were no vitals filed for this visit.    Physical Exam Constitutional:      Appearance: He is obese.  HENT:     Head:  Normocephalic.  Mouth/Throat:     Mouth: Mucous membranes are moist.  Eyes:     General: No scleral icterus. Cardiovascular:     Rate and Rhythm: Normal rate and regular rhythm.     Heart sounds: No murmur heard.    No friction rub.  Pulmonary:     Effort: No respiratory distress.     Breath sounds: No stridor. No wheezing or rhonchi.     Comments: Decreased air entry right base Musculoskeletal:     Cervical back: No rigidity or tenderness.  Neurological:     Mental Status: He is alert.  Psychiatric:        Mood and Affect: Mood normal.    Data Reviewed: PFT from 2019 with no obstruction, no restriction, normal diffusing capacity  CT scan from August 2023, CT of the abdomen did show a right pleural effusion  Recent chest x-ray 10/11/2022 with right pleural effusion, prominent bronchovascular markings-reviewed with the patient-diastolic failure  PFT reviewed showing a suboptimal study, mild obstruction, lung capacity could not be performed -Stable compared to one performed in 2019  Recent chest x-ray reviewed with chronic pleural effusion does not appear to be worse  Exudative fluid from thoracentesis performed 12/29/2022 and 10/13/2022, cytology negative  Assessment:  Chronic cough -Appears stable  Recent bronchitis -Treated with a course of antibiotics -Symptoms have improved  Chronic pleural effusion -Further evaluation with a CT scan of the chest  History of chronic heart failure-diastolic heart failure -Recently had his diuretics increased   Plan/Recommendations: Obtain CT scan of the chest without contrast to evaluate chronic effusion  Encouraged to call with significant concerns  Tentative follow-up in 3 months    Virl Diamond MD [facility] 05/09/2023, 9:07 AM  CC: [name], MD

## 2023-05-16 ENCOUNTER — Ambulatory Visit
Admission: RE | Admit: 2023-05-16 | Discharge: 2023-05-16 | Disposition: A | Discharge status: Home / Self Care | Payer: Medicare PPO | Source: Ambulatory Visit | Attending: Pulmonary Disease | Admitting: Pulmonary Disease

## 2023-05-16 DIAGNOSIS — J90 Pleural effusion, not elsewhere classified: Secondary | ICD-10-CM | POA: Insufficient documentation

## 2023-05-16 DIAGNOSIS — I700 Atherosclerosis of aorta: Secondary | ICD-10-CM | POA: Diagnosis not present

## 2023-05-16 DIAGNOSIS — R918 Other nonspecific abnormal finding of lung field: Secondary | ICD-10-CM | POA: Diagnosis not present

## 2023-05-22 ENCOUNTER — Ambulatory Visit: Payer: Self-pay

## 2023-05-22 NOTE — Patient Outreach (Signed)
  Care Coordination   Follow Up Visit Note   05/22/2023 Name: [name] Young. MRN: [id number] DOB: 09/20/1940  [name] Ros. is a 82 y.o. year old male who sees [name], MD for primary care. I  spoke with wife, [name] Young today   What matters to the patients health and wellness today?  Wife states patient seems to be doing a little better. She states he continues to have some fatigue. She denies patient complaining of increase SOB or LE swelling. She states patient will not weigh everyday.  Wife states patient seen 04/2023 by primary care provider and pulmonologist due to cough.  She states he was given antibiotic.  Per chart review patient with right pleural effusion.   She states patient had a CT scan on 05/16/23 and is currently waiting for results. Per chart review next follow up visit with primary care provider and pulmonologist 07/2023.    Goals Addressed             This Visit's Progress    Post hospital follow up / management of hepatic abcess and chronic health conditions       Interventions Today    Flowsheet Row Most Recent Value  Chronic Disease   Chronic disease during today's visit Congestive Heart Failure (CHF), Other  [bradycardia, chronic right pleural effusion]  General Interventions   General Interventions Discussed/Reviewed General Interventions Reviewed, Doctor Visits  [evaluation of current treatment plan for mentioned health conditions and patients adherence to plan as established by provider. Assessed for HF symptoms, pulse rate and ongoing pleural effusion symptoms.]  Doctor Visits Discussed/Reviewed Doctor Visits Reviewed  [name] upcoming provider visits. Advised to keep follow up visits with provider. Discussed 04/2023 primary care and pulmonology visit. Advised to call pulmonary office for CT results if not contacted by end of week 05/25/23]  Exercise Interventions   Exercise Discussed/Reviewed Physical Activity  [Assessed current  activity level]  Education Interventions   Education Provided Provided Education  [Discussed importance of weight and pulse monitoring and recording.  Reviewed HF symptoms/ action plan. Advised to notify provider for HF symptoms and ongoing low pulse rates accompanied with lightheadness/ dizziness. Call 911 for severe symptoms.]  Pharmacy Interventions   Pharmacy Dicussed/Reviewed Pharmacy Topics Reviewed  [medications reviewed and compliance discussed.]              SDOH assessments and interventions completed:  No     Care Coordination Interventions:  Yes, provided   Follow up plan: Follow up call scheduled for 06/27/23    Encounter Outcome:  Patient Visit Completed   George Ina RN,BSN,CCM [facility] Care Coordination [phone] direct line

## 2023-05-22 NOTE — Patient Instructions (Signed)
Visit Information  Thank you for taking time to visit with me today. Please don't hesitate to contact me if I can be of assistance to you.   Following are the goals we discussed today:   Goals Addressed             This Visit's Progress    Post hospital follow up / management of hepatic abcess and chronic health conditions       Interventions Today    Flowsheet Row Most Recent Value  Chronic Disease   Chronic disease during today's visit Congestive Heart Failure (CHF), Other  [bradycardia, chronic right pleural effusion]  General Interventions   General Interventions Discussed/Reviewed General Interventions Reviewed, Doctor Visits  [evaluation of current treatment plan for mentioned health conditions and patients adherence to plan as established by provider. Assessed for HF symptoms, pulse rate and ongoing pleural effusion symptoms.]  Doctor Visits Discussed/Reviewed Doctor Visits Reviewed  [name] upcoming provider visits. Advised to keep follow up visits with provider. Discussed 04/2023 primary care and pulmonology visit. Advised to call pulmonary office for CT results if not contacted by end of week 05/25/23]  Exercise Interventions   Exercise Discussed/Reviewed Physical Activity  [Assessed current activity level]  Education Interventions   Education Provided Provided Education  [Discussed importance of weight and pulse monitoring and recording.  Reviewed HF symptoms/ action plan. Advised to notify provider for HF symptoms and ongoing low pulse rates accompanied with lightheadness/ dizziness. Call 911 for severe symptoms.]  Pharmacy Interventions   Pharmacy Dicussed/Reviewed Pharmacy Topics Reviewed  [medications reviewed and compliance discussed.]              Our next appointment is by telephone on 06/27/23 at 2 pm  Please call the care guide team at [phone] if you need to cancel or reschedule your appointment.   If you are experiencing a Mental Health or Behavioral  Health Crisis or need someone to talk to, please call the Suicide and Crisis Lifeline: 988 call 1-800-273-TALK (toll free, 24 hour hotline)  Patient verbalizes understanding of instructions and care plan provided today and agrees to view in MyChart. Active MyChart status and patient understanding of how to access instructions and care plan via MyChart confirmed with patient.     George Ina RN,BSN,CCM [facility] Care Coordination [phone] direct line

## 2023-05-24 ENCOUNTER — Encounter: Payer: Self-pay | Admitting: Internal Medicine

## 2023-05-24 ENCOUNTER — Ambulatory Visit: Payer: Medicare PPO | Attending: Nurse Practitioner | Admitting: Internal Medicine

## 2023-05-24 ENCOUNTER — Ambulatory Visit: Payer: Medicare PPO | Admitting: Internal Medicine

## 2023-05-24 VITALS — BP 138/48 | HR 72 | Ht 70.0 in | Wt 232.4 lb

## 2023-05-24 DIAGNOSIS — I480 Paroxysmal atrial fibrillation: Secondary | ICD-10-CM | POA: Diagnosis not present

## 2023-05-24 DIAGNOSIS — L089 Local infection of the skin and subcutaneous tissue, unspecified: Secondary | ICD-10-CM

## 2023-05-24 DIAGNOSIS — I2510 Atherosclerotic heart disease of native coronary artery without angina pectoris: Secondary | ICD-10-CM

## 2023-05-24 DIAGNOSIS — I5022 Chronic systolic (congestive) heart failure: Secondary | ICD-10-CM | POA: Diagnosis not present

## 2023-05-24 DIAGNOSIS — I493 Ventricular premature depolarization: Secondary | ICD-10-CM | POA: Diagnosis not present

## 2023-05-24 MED ORDER — AMOXICILLIN-POT CLAVULANATE 875-125 MG PO TABS: 1.0000 | ORAL_TABLET | Freq: Two times a day (BID) | ORAL | 0 refills | Status: DC

## 2023-05-24 NOTE — Progress Notes (Signed)
Cardiology Office Note:  .   Date:  05/25/2023  ID:  [name] Merl., DOB [birth year], MRN [id number] PCP: [name], MD  [facility] Providers Cardiologist:  Yvonne Kendall, MD     History of Present Illness: .   [name] Paker. is a 82 y.o. male with history of three-vessel CAD status post CABG (01/2018) complicated by postoperative atrial fibrillation (transient atrial fibrillation recurred in the setting of sepsis related to ascending cholangitis), ascending cholangitis complicated by recurrent atrial fibrillation, acute HFrEF (LVEF 40-45%), and mitral valve endocarditis, as well as hypertension, hyperlipidemia, type 2 diabetes mellitus, hepatic abscess, and chronic kidney disease stage III, who presents for follow-up of CAD, atrial fibrillation, and endocarditis.  I last saw him in June, at which time he reported improving strength.  There was concern about recent bradycardia, though this was felt to be due to frequent PVCs.  I recommended initiation of magnesium supplementation and continuation of low-dose metoprolol.  Subsequent chest/abdominal x-rays in mid August showed a small right pleural effusion.  Subsequent chest CT in the pulmonary clinic in early September revealed a moderate right-sided pleural effusion.  Today, [name] Young is concerned about pain and purulent discharge arising from his left great toe.  This began suddenly 3 days ago.  He is scheduled to see Dr. Sharen Young tomorrow for further evaluation.  He has not had any fevers or chills.  He has been soaking the toe in Epsom salt but has not noticed much improvement.  He completed courses of antibiotics for his cough last month (doxycycline and Augmentin).  His cough has resolved.  He underwent a chest CT last week ordered by his pulmonologist; read is still pending.  [name] Young has mild exertional dyspnea, which is stable.  Chronic fatigue is unchanged from our last visit.  He has not had any chest pain,  palpitations, lightheadedness, or edema.  He sporadically checks his blood pressure at home and notes that it is normal.  ROS: See HPI  Studies Reviewed: Marland Kitchen   EKG Interpretation Date/Time:  Thursday May 24 2023 13:14:30 EDT Ventricular Rate:  72 PR Interval:  160 QRS Duration:  86 QT Interval:  392 QTC Calculation: 429 R Axis:   2  Text Interpretation: Sinus rhythm with Premature ventricular complexes Nonspecific ST abnormality When compared with ECG of 25-Apr-2023 No significant change was found Confirmed by Jamoni Hewes [phone]) on 05/25/2023 11:11:20 AM    Risk Assessment/Calculations:    CHA2DS2-VASc Score = 6   This indicates a 9.7% annual risk of stroke. The patient's score is based upon: CHF History: 1 HTN History: 1 Diabetes History: 1 Stroke History: 0 Vascular Disease History: 1 Age Score: 2 Gender Score: 0            Physical Exam:   VS:  BP (!) 138/48 (BP Location: Left Arm, Patient Position: Sitting, Cuff Size: Normal)   Pulse 72   Ht 5\' 10"  (1.778 m)   Wt 232 lb 6.4 oz (105.4 kg)   SpO2 98%   BMI 33.35 kg/m    Wt Readings from Last 3 Encounters:  05/24/23 232 lb 6.4 oz (105.4 kg)  05/09/23 232 lb 3.2 oz (105.3 kg)  04/25/23 230 lb 6 oz (104.5 kg)    General:  NAD.  Accompanied by his wife. Neck: No JVD or HJR. Lungs: Diminished breath sounds at the right lung base.  Left lung is clear without crackles. Heart: Regular rate and rhythm with occasional extrasystoles and 1/6  systolic murmur. Abdomen: Soft, nontender, nondistended. Extremities: No lower extremity edema.  Left great toe has erythema along the medial nailbed with purulent discharge and small area of bleeding at the tip of the toenail.  ASSESSMENT AND PLAN: .    Left great toe infection: Left great toe is obviously infected with small skin break near the tip of the toenail and associated erythema surrounding the nailbed.  There is purulent discharge.  [name] Young has not had any new  constitutional symptoms.  We have agreed to initiate a course of Augmentin 875/125 mg twice daily x 10 days.  [name] Young should proceed with further evaluation by Dr. Sharen Young tomorrow.  Low threshold for podiatry evaluation and Colace debridement as necessary.  I advised him to seek immediate medical attention for worsening of the infection.  Coronary artery disease: No angina reported.  Continue secondary prevention with current medications, including apixaban and lieu of aspirin given history of atrial fibrillation.  Frequent PVCs: Incidentally noted on EKG again today.  Given lack of symptoms, we will continue current dose of metoprolol and forego additional testing.  Chronic HFrEF: Mr. Covin appears grossly euvolemic with NYHA class II-3 symptoms.  Continue current doses of metoprolol succinate and furosemide.  I will defer adding ACE inhibitor or ARB today given soft diastolic reading, particularly in the setting of active infection.  We will readdress this at follow-up.  Paroxysmal atrial fibrillation: No evidence of recurrence.  Continue apixaban 5 mg twice daily.    Dispo: Return to clinic in 3 months.  Signed, Yvonne Kendall, MD

## 2023-05-24 NOTE — Patient Instructions (Signed)
Medication Instructions:  Your physician recommends the following medication changes.   START TAKING: Augmentin 875-125 mg by mouth twice a day for 10 days   *If you need a refill on your cardiac medications before your next appointment, please call your pharmacy*   Lab Work: No labs ordered today    Testing/Procedures: No test ordered today    Follow-Up: At [facility], you and your health needs are our priority.  As part of our continuing mission to provide you with exceptional heart care, we have created designated Provider Care Teams.  These Care Teams include your primary Cardiologist (physician) and Advanced Practice Providers (APPs -  Physician Assistants and Nurse Practitioners) who all work together to provide you with the care you need, when you need it.  We recommend signing up for the patient portal called "MyChart".  Sign up information is provided on this After Visit Summary.  MyChart is used to connect with patients for Virtual Visits (Telemedicine).  Patients are able to view lab/test results, encounter notes, upcoming appointments, etc.  Non-urgent messages can be sent to your provider as well.   To learn more about what you can do with MyChart, go to [email or web address].    Your next appointment:   3 month(s)  Provider:   You may see Yvonne Kendall, MD or one of the following Advanced Practice Providers on your designated Care Team:   Nicolasa Ducking, NP Eula Listen, PA-C Cadence Fransico Michael, PA-C Charlsie Quest, NP

## 2023-05-25 ENCOUNTER — Ambulatory Visit: Payer: Medicare PPO | Admitting: Family Medicine

## 2023-05-25 ENCOUNTER — Encounter: Payer: Self-pay | Admitting: Family Medicine

## 2023-05-25 ENCOUNTER — Encounter: Payer: Self-pay | Admitting: Internal Medicine

## 2023-05-25 VITALS — BP 138/72 | HR 44 | Temp 97.8°F | Ht 70.0 in | Wt 228.4 lb

## 2023-05-25 DIAGNOSIS — L03032 Cellulitis of left toe: Secondary | ICD-10-CM | POA: Insufficient documentation

## 2023-05-25 DIAGNOSIS — E1169 Type 2 diabetes mellitus with other specified complication: Secondary | ICD-10-CM

## 2023-05-25 DIAGNOSIS — L089 Local infection of the skin and subcutaneous tissue, unspecified: Secondary | ICD-10-CM | POA: Insufficient documentation

## 2023-05-25 DIAGNOSIS — G6289 Other specified polyneuropathies: Secondary | ICD-10-CM | POA: Diagnosis not present

## 2023-05-25 MED ORDER — DOXYCYCLINE HYCLATE 100 MG PO TABS: 100.0000 mg | ORAL_TABLET | Freq: Two times a day (BID) | ORAL | 0 refills | Status: DC

## 2023-05-25 NOTE — Progress Notes (Signed)
Ph: [phone] Fax: [phone]   Patient ID: [name] Merl., male    DOB: 1940-12-29, 82 y.o.   MRN: [id number]  This visit was conducted in person.  BP 138/72   Pulse (!) 44 Comment: on manual check  Temp 97.8 F (36.6 C) (Temporal)   Ht 5\' 10"  (1.778 m)   Wt 228 lb 6 oz (103.6 kg)   SpO2 97%   BMI 32.77 kg/m    CC: toe pain  Subjective:   HPI: [name] Hislop. is a 81 y.o. male presenting on 05/25/2023 for Toe Pain (C/o toe pain in L great toe pain. Started 05/22/23. Seen by cards yesterday and prescribed abx but wanted to see Dr Reece Agar before starting. Pt accompanied by wife, [name] Young. )   3d h/o pain and purulent discharge out of left great toe. Soaking toe in epsom salt without benefit. Saw Dr End cardiology yesterday - started on augmentin BID antibiotic - but hasn't filled yet. No fevers/chills, nausea.   No known h/o gout.   Seeing pulm for chronic pleural effusion. Recently completed augmentin/doxycycline antibiotic course with benefit.   ?bradycardia - known PVCs.  Known diabetic, most recently diet controlled s/p weight loss after acute illness.  Lab Results  Component Value Date   HGBA1C 5.8 12/26/2022        Relevant past medical, surgical, family and social history reviewed and updated as indicated. Interim medical history since our last visit reviewed. Allergies and medications reviewed and updated. Outpatient Medications Prior to Visit  Medication Sig Dispense Refill   acetaminophen (TYLENOL) 325 MG tablet Take 1 tablet (325 mg total) by mouth at bedtime as needed (sleep aid).     Cholecalciferol (VITAMIN D3) 25 MCG (1000 UT) CAPS Take 1 capsule (1,000 Units total) by mouth daily. 30 capsule    Cyanocobalamin (B-12) 1000 MCG CAPS Take by mouth daily at 2 am.     ELIQUIS 5 MG TABS tablet TAKE 1 TABLET BY MOUTH TWICE A DAY 60 tablet 5   feeding supplement (ENSURE ENLIVE / ENSURE PLUS) LIQD Take 237 mLs by mouth 2 (two) times daily between  meals. 14220 mL 0   furosemide (LASIX) 40 MG tablet Take 0.5 tablets (20 mg total) by mouth daily. 30 tablet 3   glucose blood (TRUE METRIX BLOOD GLUCOSE TEST) test strip Use as instructed to check blood sugar once a day 100 each 3   Magnesium Citrate 125 MG CAPS Take 250 mg by mouth daily.     metoprolol succinate (TOPROL-XL) 25 MG 24 hr tablet Take 0.5 tablets (12.5 mg total) by mouth daily. Take with or immediately following a meal. 45 tablet 1   mirtazapine (REMERON) 15 MG tablet TAKE 1 TABLET BY MOUTH EVERYDAY AT BEDTIME 90 tablet 1   Multiple Vitamins-Minerals (MACULAR VITAMIN BENEFIT PO) Take 1 tablet by mouth daily.     nitroGLYCERIN (NITROSTAT) 0.4 MG SL tablet Place 1 tablet (0.4 mg total) under the tongue every 5 (five) minutes as needed for chest pain. 25 tablet 4   rosuvastatin (CRESTOR) 10 MG tablet TAKE 1 TABLET BY MOUTH EVERY DAY 90 tablet 3   tamsulosin (FLOMAX) 0.4 MG CAPS capsule TAKE 1 CAPSULE BY MOUTH EVERY DAY 90 capsule 3   amoxicillin-clavulanate (AUGMENTIN) 875-125 MG tablet Take 1 tablet by mouth 2 (two) times daily. 20 tablet 0   No facility-administered medications prior to visit.     Per HPI unless specifically indicated in ROS section below Review of  Systems  Objective:  BP 138/72   Pulse (!) 44 Comment: on manual check  Temp 97.8 F (36.6 C) (Temporal)   Ht 5\' 10"  (1.778 m)   Wt 228 lb 6 oz (103.6 kg)   SpO2 97%   BMI 32.77 kg/m   Wt Readings from Last 3 Encounters:  05/25/23 228 lb 6 oz (103.6 kg)  05/24/23 232 lb 6.4 oz (105.4 kg)  05/09/23 232 lb 3.2 oz (105.3 kg)      Physical Exam Vitals and nursing note reviewed.  Constitutional:      Appearance: Normal appearance.  Pulmonary:     Effort: Pulmonary effort is normal. No respiratory distress.     Breath sounds: No wheezing, rhonchi or rales.     Comments: Crackles LLL, decreased breath sounds RLL Musculoskeletal:        General: No swelling.  Skin:    General: Skin is warm and dry.      Findings: Erythema present. No rash.     Comments:  Left great toe tender to palpation along lateral nail fold with scab to distal tip of nail fold  Erythema present from lateral nailbed to distal lateral nailfold No obvious abscess, no fluctuance   Neurological:     Mental Status: He is alert.  Psychiatric:        Mood and Affect: Mood normal.        Behavior: Behavior normal.        Assessment & Plan:   Problem List Items Addressed This Visit     Type 2 diabetes mellitus with other specified complication (HCC)   Peripheral neuropathy   Paronychia of great toe of left foot - Primary    3d h/o L great toe pain with drainage most consistent with lateral nailfold paronychia of left great toe. Area was draining but currently seems scabbed up - nothing to culture.  Will Rx doxycycline 10d course, start topical neosporin, encouraged continue epsom warm soaks and/or warm compresses. Doxycycline chosen to cover possible MRSA infection.  No systemic symptoms  If not improving with this treatment, discussed scheduling sooner appt with podiatry Dr Eloy End.        Meds ordered this encounter  Medications   doxycycline (VIBRA-TABS) 100 MG tablet    Sig: Take 1 tablet (100 mg total) by mouth 2 (two) times daily.    Dispense:  20 tablet    Refill:  0    In place of augmentin    No orders of the defined types were placed in this encounter.   Patient Instructions  Continue epsom soaks 3 times a day if able, elevate leg.  Start using topical neosporin daily after soaks.  Start doxycycline antibiotic in place of augmentin. If not improving on antibiotic, return to see podiatry Dr Eloy End.   Follow up plan: No follow-ups on file.  [name], MD

## 2023-05-25 NOTE — Assessment & Plan Note (Addendum)
3d h/o L great toe pain with drainage most consistent with lateral nailfold paronychia of left great toe. Area was draining but currently seems scabbed up - nothing to culture.  Will Rx doxycycline 10d course, start topical neosporin, encouraged continue epsom warm soaks and/or warm compresses. Doxycycline chosen to cover possible MRSA infection.  No systemic symptoms  If not improving with this treatment, discussed scheduling sooner appt with podiatry Dr Eloy End.

## 2023-05-25 NOTE — Patient Instructions (Addendum)
Continue epsom soaks 3 times a day if able, elevate leg.  Start using topical neosporin daily after soaks.  Start doxycycline antibiotic in place of augmentin. If not improving on antibiotic, return to see podiatry Dr Eloy End.

## 2023-06-16 ENCOUNTER — Other Ambulatory Visit: Payer: Self-pay | Admitting: Internal Medicine

## 2023-06-21 ENCOUNTER — Encounter: Payer: Self-pay | Admitting: Podiatry

## 2023-06-21 ENCOUNTER — Ambulatory Visit: Payer: Medicare PPO | Admitting: Podiatry

## 2023-06-21 DIAGNOSIS — B351 Tinea unguium: Secondary | ICD-10-CM | POA: Diagnosis not present

## 2023-06-21 DIAGNOSIS — M79674 Pain in right toe(s): Secondary | ICD-10-CM | POA: Diagnosis not present

## 2023-06-21 DIAGNOSIS — M79675 Pain in left toe(s): Secondary | ICD-10-CM

## 2023-06-21 DIAGNOSIS — E1142 Type 2 diabetes mellitus with diabetic polyneuropathy: Secondary | ICD-10-CM | POA: Diagnosis not present

## 2023-06-21 DIAGNOSIS — L600 Ingrowing nail: Secondary | ICD-10-CM | POA: Diagnosis not present

## 2023-06-21 NOTE — Progress Notes (Signed)
Subjective:  Patient ID: [name] Merl., male    DOB: May 16, 1941,  MRN: [id number]  [name] Merl. presents to clinic today for: at risk foot care with history of diabetic neuropathy and painful, discolored, thick toenails which interfere with daily activities. Chief Complaint  Patient presents with   Diabetes    BS- A1C-6.7 PCPV-05/2023    Patient is accompanied by his wife on today's visit. States he had an infection in left great toe. He saw Cardiology on 09/12/ and PCP, Dr. [name] on 09/13. Patient was give course of doxycycline for 10 days as well as epsom salt soaks. They relate redness and drainage resolved, but [name] Young does still have some tenderness and points to lateral border of left great toe.   PCP is [name], MD.  No Known Allergies  Review of Systems: Negative except as noted in the HPI.  Objective: No changes noted in today's physical examination. There were no vitals filed for this visit.  [name] Kirshenbaum. is a pleasant 82 y.o. male in NAD. AAO x 3.  Vascular Examination: Capillary refill time <3 seconds b/l LE. Faintly palpable pedal pulses b/l LE. Digital hair absent b/l. No pedal edema b/l. Skin temperature gradient WNL b/l. No varicosities b/l. Marland Kitchen  Dermatological Examination: Pedal skin with normal turgor, texture and tone b/l. No open wounds. No interdigital macerations b/l.   Toenails 2-5 b/l thickened, discolored, dystrophic with subungual debris. There is pain on palpation to dorsal aspect of nailplates.   Incurvated nailplate lateral border left hallux and lateral border right hallux.  Nail border hypertrophy absent. There is tenderness to palpation. Sign(s) of infection: no clinical signs of infection noted on examination today.  Neurological Examination: Protective sensation diminished with 10g monofilament b/l.  Musculoskeletal Examination: Muscle strength 5/5 to all lower extremity muscle groups bilaterally. Uses  walking stick for ambulation assistance.      Latest Ref Rng & Units 12/26/2022    2:05 PM  Hemoglobin A1C  Hemoglobin-A1c 4.6 - 6.5 % 5.8    Assessment/Plan: 1. Pain due to onychomycosis of toenails of both feet   2. Ingrown toenail without infection   3. Diabetic polyneuropathy associated with type 2 diabetes mellitus (HCC)     -Patient's family member present. All questions/concerns addressed on today's visit. -Discussed ingrown toenail and episode of cellulitis left great toe. Infection has resolved, but he still has tenderness of lateral borders of both great toes. If this continues to be a problem, we discussed permanent removal of offending nail borders of nail. He can see Dr. Logan Bores, Dr. Allena Katz or Dr. Lilian Kapur should it become a problem in the future. I informed his wife to call office immediately. They related understanding. -Patient to continue soft, supportive shoe gear daily. -Toenails were debrided in length and girth 2-5 bilaterally with sterile nail nippers and dremel without iatrogenic bleeding.  -No invasive procedure(s) performed. Offending nail border debrided and curretaged lateral border left hallux and lateral border right hallux utilizing sterile nail nipper and currette. Border(s) cleansed with alcohol and triple antibiotic ointment applied. Patient/POA/Caregiver/Facility instructed to apply triple antibiotic ointment  to bilateral great toes once daily for 7 days. Call office if there are any concerns. -Patient/POA to call should there be question/concern in the interim.   Return in about 9 weeks (around 08/23/2023).  Freddie Breech, DPM

## 2023-06-25 ENCOUNTER — Encounter: Payer: Self-pay | Admitting: Pulmonary Disease

## 2023-06-26 ENCOUNTER — Other Ambulatory Visit: Payer: Self-pay | Admitting: Pulmonary Disease

## 2023-06-26 ENCOUNTER — Ambulatory Visit: Payer: Self-pay

## 2023-06-26 DIAGNOSIS — J90 Pleural effusion, not elsewhere classified: Secondary | ICD-10-CM

## 2023-06-26 NOTE — Telephone Encounter (Signed)
CT scan did show pleural effusion on the right.  Similar to what was there in April  Testing before was consistent with etiology likely being from heart failure  Will schedule for drainage and retesting of the fluid if okay with you

## 2023-06-26 NOTE — Patient Outreach (Signed)
Care Coordination   06/26/2023 Name: [name] Young. MRN: [id number] DOB: Dec 02, 1940   Care Coordination Outreach Attempts:  An unsuccessful telephone outreach was attempted for a scheduled appointment today.  Unable to reach patient. HIPAA compliant message left with return call phone number.   Follow Up Plan:  Additional outreach attempts will be made to offer the patient care coordination information and services.   Encounter Outcome:  No Answer   Care Coordination Interventions:  No, not indicated    George Ina [facility] Care Coordination [phone] direct line

## 2023-06-28 NOTE — Telephone Encounter (Signed)
[facility], the order was placed on 10/15. He would like to go to [facility]. Please help get him scheduled. Thank you!  Foye Clock, CMA

## 2023-06-29 ENCOUNTER — Ambulatory Visit: Payer: Medicare PPO | Admitting: Podiatry

## 2023-06-29 ENCOUNTER — Telehealth: Payer: Self-pay

## 2023-06-29 ENCOUNTER — Ambulatory Visit: Payer: Medicare PPO | Admitting: Family Medicine

## 2023-06-29 DIAGNOSIS — L600 Ingrowing nail: Secondary | ICD-10-CM | POA: Diagnosis not present

## 2023-06-29 NOTE — Telephone Encounter (Addendum)
IR Thoracentesis order was not in [facility] but rather in [facility], that is why it has not been scheduled yet here at [facility].  Pt wants to come to [facility]  Called patient 06/29/2023 to schedule and was informed by the patient's spouse that the thora would need to be scheduled out at least 3 weeks per patient's Podiatrist due to the patient having an infected ingrown toenail that is being taken care of today 06/29/2023.  Pt's spouse agreed to the date of Tues 07/17/23 for the thora to be scheduled.

## 2023-06-29 NOTE — Progress Notes (Signed)
No chief complaint on file.   Subjective: Patient presents today for evaluation of pain to the lateral border right great toe. Patient is concerned for possible ingrown nail.  It is very sensitive to touch.  Patient presents today for further treatment and evaluation.  Past Medical History:  Diagnosis Date   Adenomatous colon polyp    Aortic atherosclerosis (HCC)    Arthritis    Ascending aorta dilatation (HCC)    a.) TTE 11/23/2017: asc Ao measured 37 mm; b.) TTE 10/23/2021: Ao root measured 41 mm, asc Ao measured 38 mm; c.) TEE 10/28/2021: asc Ao measured 38 mm; d.) TTE 01/11/2022: Ao root 40 mm, asc Ao 39 mm   Ascending cholangitis 10/2021   BPH (benign prostatic hyperplasia)    Cardiomyopathy (HCC)    CKD (chronic kidney disease), stage III (HCC)    Claustrophobia    Coronary artery disease    a.) LHC 12/21/2017: 75% mLAD, 90% D1, 80-99% OM1/2, CTO pRCA (L-R collaterals) --> CVTS consult. 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   In setting of bacteremia from ascending colangitis   GERD (gastroesophageal reflux disease)    HFrEF (heart failure with reduced ejection fraction) (HCC)    a.) TTE 11/23/2017: EF 50-55%, mod MAC, triv TR, G1DD; b.) TTE 10/23/2021: EF 40-45%, mild LAE, Ao sclerosis, triv MR, G2DD; c.) TEE 10/28/2021: EF 40-45%, glob HK, mobile vegitation on MV; d.) TTE 01/11/2022: EF 50-55%, mild LVH, RVE, Ao sclerosis, mild MR/AR, G1DD   History of cholelithiasis    History of kidney stones    ca ox Vonita Moss @ [facility]) now [facility] [facility]   History of pneumonia    HLD (hyperlipidemia)    HTN (hypertension)    Jaundice    age 82   Long term current use of anticoagulant    a.) apixaban   Paroxysmal atrial fibrillation (HCC)    a.) CHA2DS2VASc = 6 (age x 2, HFrEF, HTN, vascular disease history, T2DM);  b.) rate/rhythm maintained on oral metoprolol succinate; chronically anticoagulated with apixaban   Pneumonia     Right-sided carotid artery disease (HCC)    a.) carotid doppler 04/05/2022: 1-39% RICA   S/P CABG x 3    a.) LIMA-LAD, SVG-diagonal, SVG-PL branch of RCA   S/P cataract extraction and insertion of intraocular lens    T2DM (type 2 diabetes mellitus) (HCC) 2010    Past Surgical History:  Procedure Laterality Date   CARPAL TUNNEL RELEASE Bilateral    CATARACT EXTRACTION, BILATERAL     COLONOSCOPY  11/2012   12 adenomatous polyps, diverticulosis, rec rpt 1 yr [name])   COLONOSCOPY  12/2013   3 polyps, diverticulosis, rec rpt 3 yrs [name])   COLONOSCOPY  06/2019   7 polyps (TA), diverticulosis, f/u left open ended [name])   CORONARY ARTERY BYPASS GRAFT N/A 01/18/2018   Procedure: CORONARY ARTERY BYPASS GRAFTING (CABG) x 3; Using Left Internal Mammary Artery, and Right Greater Saphenous Vein harvested Endoscopically, Coronary Artery Endarterectomy;  Surgeon: Kerin Perna, MD;  Location: [facility] OR;  Service: Open Heart Surgery;  Laterality: N/A;   ERCP N/A 10/23/2021   Procedure: ENDOSCOPIC RETROGRADE CHOLANGIOPANCREATOGRAPHY (ERCP);  Surgeon: Meryl Dare, MD;  Location: [facility] ENDOSCOPY;  Service: Endoscopy;  Laterality: N/A;   IR CATHETER TUBE CHANGE  01/08/2023   IR RADIOLOGIST EVAL & MGMT  01/23/2023   IR THORACENTESIS ASP PLEURAL SPACE W/IMG GUIDE  12/29/2022   KNEE CARTILAGE SURGERY  Left    LEFT HEART CATH AND CORONARY ANGIOGRAPHY N/A 12/21/2017   Procedure: LEFT HEART CATH AND CORONARY ANGIOGRAPHY;  Surgeon: Yvonne Kendall, MD;  Location: [facility] INVASIVE CV LAB;  Service: Cardiovascular;  Laterality: N/A;   LITHOTRIPSY     REMOVAL OF STONES  10/23/2021   Procedure: REMOVAL OF STONES;  Surgeon: Meryl Dare, MD;  Location: [facility] ENDOSCOPY;  Service: Endoscopy;;   SPHINCTEROTOMY  10/23/2021   Procedure: Dennison Mascot;  Surgeon: Meryl Dare, MD;  Location: [facility] ENDOSCOPY;  Service: Endoscopy;;   TEE WITHOUT CARDIOVERSION N/A 01/18/2018   Procedure: TRANSESOPHAGEAL ECHOCARDIOGRAM (TEE);   Surgeon: Donata Clay, Theron Arista, MD;  Location: [facility] OR;  Service: Open Heart Surgery;  Laterality: N/A;   TEE WITHOUT CARDIOVERSION N/A 10/28/2021   Procedure: TRANSESOPHAGEAL ECHOCARDIOGRAM (TEE);  Surgeon: Chrystie Nose, MD;  Location: [facility] ENDOSCOPY;  Service: Cardiovascular;  Laterality: N/A;   UMBILICAL HERNIA REPAIR     with mesh    No Known Allergies  Objective:  General: Well developed, nourished, in no acute distress, alert and oriented x3   Dermatology: Skin is warm, dry and supple bilateral.  Lateral border right great toe is tender with evidence of an ingrowing nail. Pain on palpation noted to the border of the nail fold. The remaining nails appear unremarkable at this time.   Vascular: DP and PT pulses palpable.  No clinical evidence of vascular compromise  Neruologic: Grossly intact via light touch bilateral.  Musculoskeletal: No pedal deformity noted  Assesement: #1 Paronychia with ingrowing nail lateral border right great toe  Plan of Care:  -Patient evaluated.  -Discussed treatment alternatives and plan of care. Explained nail avulsion procedure and post procedure course to patient. -Patient opted for temporary partial nail avulsion of the ingrown portion of the nail.  -Prior to procedure, local anesthesia infiltration utilized using 3 ml of a 50:50 mixture of 2% plain lidocaine and 0.5% plain marcaine in a normal hallux block fashion and a betadine prep performed.  -Partial temporary nail avulsion without chemical matrixectomy performed  -Light dressing applied.  Post care instructions provided -Return to clinic 2 weeks  *[facility] [place]  Felecia Shelling, DPM [facility]  Dr. Felecia Shelling, DPM    [address] [place], [place] [address]                Office [phone]  Fax [phone]

## 2023-06-29 NOTE — Telephone Encounter (Signed)
[name] Young with scheduling was trying to get the patient seen on Monday 07/02/23 when she spoke with wife she stated he has an ingrown toenail and it having it removed today. His doctor states not to do anything for 3 weeks. [name] Young stated the wife wanted it scheduled on 07/17/23

## 2023-06-29 NOTE — Telephone Encounter (Signed)
The order was put to be done at [facility]. Marylu Lund with scheduling only does [facility] and [facility]. I brought this to her attention and she is going to get this scheduled for the patient

## 2023-07-03 ENCOUNTER — Telehealth: Payer: Self-pay | Admitting: *Deleted

## 2023-07-03 ENCOUNTER — Other Ambulatory Visit: Payer: Self-pay | Admitting: Family Medicine

## 2023-07-03 DIAGNOSIS — E538 Deficiency of other specified B group vitamins: Secondary | ICD-10-CM

## 2023-07-03 DIAGNOSIS — N183 Chronic kidney disease, stage 3 unspecified: Secondary | ICD-10-CM

## 2023-07-03 DIAGNOSIS — E559 Vitamin D deficiency, unspecified: Secondary | ICD-10-CM

## 2023-07-03 DIAGNOSIS — R7989 Other specified abnormal findings of blood chemistry: Secondary | ICD-10-CM

## 2023-07-03 DIAGNOSIS — E1169 Type 2 diabetes mellitus with other specified complication: Secondary | ICD-10-CM

## 2023-07-03 NOTE — Progress Notes (Signed)
Care Coordination Note  07/03/2023 Name: [name] Young. MRN: [id number] DOB: November 04, 1940  [name]. is a 82 y.o. year old male who is a primary care patient of [name], MD and is actively engaged with the care management team. I reached out to [name] by phone today to assist with re-scheduling a follow up visit with the RN Case Manager  Follow up plan: Per pt wife [name] - appreciative but services no longer needed at this time   Burman Nieves, [facility] Care Coordination Care Guide Direct Dial: [phone]

## 2023-07-03 NOTE — Patient Outreach (Signed)
Care Coordination   Collaboration  Visit Note   07/03/2023 Name: Marvis Millison. MRN: [id number] DOB: Aug 05, 1941  Jamarion Rieker. is a 82 y.o. year old male who sees [name], MD for primary care. Received notification from [facility], care guide that she spoke with wife who states patient is doing well and no longer needs care coordination services.     Goals Addressed             This Visit's Progress    COMPLETED: Post hospital follow up / management of hepatic abcess and chronic health conditions       Interventions Today    Flowsheet Row Most Recent Value  Chronic Disease   Chronic disease during today's visit Congestive Heart Failure (CHF), Other  [bradycardia, chronic right pleural effusion]  General Interventions   General Interventions Discussed/Reviewed General Interventions Reviewed, Doctor Visits  [evaluation of current treatment plan for mentioned health conditions and patients adherence to plan as established by provider. Assessed for HF symptoms, pulse rate and ongoing pleural effusion symptoms.]  Doctor Visits Discussed/Reviewed Doctor Visits Reviewed  [name] upcoming provider visits. Advised to keep follow up visits with provider. Discussed 04/2023 primary care and pulmonology visit. Advised to call pulmonary office for CT results if not contacted by end of week 05/25/23]  Exercise Interventions   Exercise Discussed/Reviewed Physical Activity  [Assessed current activity level]  Education Interventions   Education Provided Provided Education  [Discussed importance of weight and pulse monitoring and recording.  Reviewed HF symptoms/ action plan. Advised to notify provider for HF symptoms and ongoing low pulse rates accompanied with lightheadness/ dizziness. Call 911 for severe symptoms.]  Pharmacy Interventions   Pharmacy Dicussed/Reviewed Pharmacy Topics Reviewed  [medications reviewed and compliance discussed.]              SDOH  assessments and interventions completed:  No     Care Coordination Interventions:  No, not indicated   Follow up plan: No further intervention required.   Encounter Outcome:  Patient Visit Completed   George Ina [facility] Care Coordination [phone] direct line

## 2023-07-09 ENCOUNTER — Other Ambulatory Visit (INDEPENDENT_AMBULATORY_CARE_PROVIDER_SITE_OTHER): Payer: Medicare PPO

## 2023-07-09 ENCOUNTER — Telehealth: Payer: Self-pay

## 2023-07-09 DIAGNOSIS — E1122 Type 2 diabetes mellitus with diabetic chronic kidney disease: Secondary | ICD-10-CM | POA: Diagnosis not present

## 2023-07-09 DIAGNOSIS — E538 Deficiency of other specified B group vitamins: Secondary | ICD-10-CM

## 2023-07-09 DIAGNOSIS — N183 Chronic kidney disease, stage 3 unspecified: Secondary | ICD-10-CM | POA: Diagnosis not present

## 2023-07-09 DIAGNOSIS — R7989 Other specified abnormal findings of blood chemistry: Secondary | ICD-10-CM | POA: Diagnosis not present

## 2023-07-09 DIAGNOSIS — E1169 Type 2 diabetes mellitus with other specified complication: Secondary | ICD-10-CM | POA: Diagnosis not present

## 2023-07-09 DIAGNOSIS — E785 Hyperlipidemia, unspecified: Secondary | ICD-10-CM | POA: Diagnosis not present

## 2023-07-09 DIAGNOSIS — E559 Vitamin D deficiency, unspecified: Secondary | ICD-10-CM

## 2023-07-09 LAB — CBC WITH DIFFERENTIAL/PLATELET
Basophils Absolute: 0 10*3/uL (ref 0.0–0.1)
Basophils Relative: 0.5 % (ref 0.0–3.0)
Eosinophils Absolute: 0.1 10*3/uL (ref 0.0–0.7)
Eosinophils Relative: 1.9 % (ref 0.0–5.0)
HCT: 39.3 % (ref 39.0–52.0)
Hemoglobin: 12.4 g/dL — ABNORMAL LOW (ref 13.0–17.0)
Lymphocytes Relative: 30.1 % (ref 12.0–46.0)
Lymphs Abs: 2.2 10*3/uL (ref 0.7–4.0)
MCHC: 31.6 g/dL (ref 30.0–36.0)
MCV: 89.6 fL (ref 78.0–100.0)
Monocytes Absolute: 0.5 10*3/uL (ref 0.1–1.0)
Monocytes Relative: 7.3 % (ref 3.0–12.0)
Neutro Abs: 4.4 10*3/uL (ref 1.4–7.7)
Neutrophils Relative %: 60.2 % (ref 43.0–77.0)
Platelets: 198 10*3/uL (ref 150.0–400.0)
RBC: 4.39 Mil/uL (ref 4.22–5.81)
RDW: 17.4 % — ABNORMAL HIGH (ref 11.5–15.5)
WBC: 7.3 10*3/uL (ref 4.0–10.5)

## 2023-07-09 LAB — COMPREHENSIVE METABOLIC PANEL
ALT: 23 U/L (ref 0–53)
AST: 18 U/L (ref 0–37)
Albumin: 3.8 g/dL (ref 3.5–5.2)
Alkaline Phosphatase: 58 U/L (ref 39–117)
BUN: 25 mg/dL — ABNORMAL HIGH (ref 6–23)
CO2: 30 meq/L (ref 19–32)
Calcium: 9.1 mg/dL (ref 8.4–10.5)
Chloride: 105 meq/L (ref 96–112)
Creatinine, Ser: 0.99 mg/dL (ref 0.40–1.50)
GFR: 71.67 mL/min (ref >60.00)
Glucose, Bld: 117 mg/dL — ABNORMAL HIGH (ref 70–99)
Potassium: 5.2 meq/L — ABNORMAL HIGH (ref 3.5–5.1)
Sodium: 143 meq/L (ref 135–145)
Total Bilirubin: 0.6 mg/dL (ref 0.2–1.2)
Total Protein: 6.8 g/dL (ref 6.0–8.3)

## 2023-07-09 LAB — LIPID PANEL
Cholesterol: 132 mg/dL (ref 0–200)
HDL: 30 mg/dL — ABNORMAL LOW (ref >39.00)
LDL Cholesterol: 53 mg/dL (ref 0–99)
NonHDL: 101.67
Total CHOL/HDL Ratio: 4
Triglycerides: 244 mg/dL — ABNORMAL HIGH (ref 0.0–149.0)
VLDL: 48.8 mg/dL — ABNORMAL HIGH (ref 0.0–40.0)

## 2023-07-09 LAB — VITAMIN B12: Vitamin B-12: 1098 pg/mL — ABNORMAL HIGH (ref 211–911)

## 2023-07-09 LAB — PHOSPHORUS: Phosphorus: 4.2 mg/dL (ref 2.3–4.6)

## 2023-07-09 LAB — MICROALBUMIN / CREATININE URINE RATIO
Creatinine,U: 124 mg/dL
Microalb Creat Ratio: 5.1 mg/g (ref 0.0–30.0)
Microalb, Ur: 6.3 mg/dL — ABNORMAL HIGH (ref 0.0–1.9)

## 2023-07-09 LAB — HEMOGLOBIN A1C: Hgb A1c MFr Bld: 6.1 % (ref 4.6–6.5)

## 2023-07-09 LAB — VITAMIN D 25 HYDROXY (VIT D DEFICIENCY, FRACTURES): VITD: 35.58 ng/mL (ref 30.00–100.00)

## 2023-07-09 LAB — T4, FREE: Free T4: 0.67 ng/dL (ref 0.60–1.60)

## 2023-07-09 LAB — TSH: TSH: 4.56 u[IU]/mL (ref 0.35–5.50)

## 2023-07-09 NOTE — Patient Outreach (Signed)
Care Coordination   07/09/2023 Name: [name] Young. MRN: [id number] DOB: 10-Jan-1941   Care Coordination Outreach Attempts:  Successful telephone outreach to spouse, Mrs. Caralee Ates.  Mrs. Safer states she doesn't think that patient needs care coordination services at this time. She states he is following up closely with his providers and adhering to the treatment plan.  Wife states she will call RNCM if care coordination services are needed in the future.   Follow Up Plan:  No further outreach attempts will be made at this time. We have been unable to contact the patient to offer or enroll patient in care coordination services  Encounter Outcome:  Patient Visit Completed   Care Coordination Interventions:  No, not indicated    George Ina [facility] Care Coordination [phone] direct line

## 2023-07-10 LAB — PARATHYROID HORMONE, INTACT (NO CA): PTH: 77 pg/mL (ref 16–77)

## 2023-07-11 ENCOUNTER — Encounter: Payer: Self-pay | Admitting: Podiatry

## 2023-07-11 ENCOUNTER — Ambulatory Visit: Payer: Medicare PPO | Admitting: Podiatry

## 2023-07-11 VITALS — Ht 70.0 in | Wt 228.0 lb

## 2023-07-11 DIAGNOSIS — L600 Ingrowing nail: Secondary | ICD-10-CM | POA: Diagnosis not present

## 2023-07-11 NOTE — Progress Notes (Signed)
Subjective:  Patient ID: [name] Young., male    DOB: 02/21/1941,  MRN: [id number]  Chief Complaint  Patient presents with   Ingrown Toenail    Patient is here for ingrown F/U    DOS: 06/29/2023 Procedure: Partial ingrown removal  82 y.o. male returns for post-op check.  He was in the absence also since directed  Review of Systems: Negative except as noted in the HPI. Denies N/V/F/Ch.   Objective:  There were no vitals filed for this visit. Body mass index is 32.71 kg/m. Constitutional Well developed. Well nourished.  Vascular Foot warm and well perfused. Capillary refill normal to all digits.  Calf is soft and supple, no posterior calf or knee pain, negative Homans' sign  Neurologic Normal speech. Oriented to person, place, and time. Epicritic sensation to light touch grossly absent bilaterally.  Dermatologic Well-healed nail avulsion site right hallux  Orthopedic: No pain to palpation noted about the surgical site.    Assessment:   1. Ingrown toenail of right foot    Plan:  Patient was evaluated and treated and all questions answered.  S/p foot surgery right -Progressing as expected post-operatively.  Can leave toe open to air discontinue soaks and ointment at this point.  Return to see me or Dr. Logan Bores as needed if there is further infection or ingrown toenail  Return if symptoms worsen or fail to improve.

## 2023-07-16 ENCOUNTER — Ambulatory Visit: Payer: Medicare PPO | Admitting: Family Medicine

## 2023-07-16 ENCOUNTER — Encounter: Payer: Self-pay | Admitting: Family Medicine

## 2023-07-16 VITALS — BP 164/72 | HR 66 | Temp 97.9°F | Ht 69.25 in | Wt 248.1 lb

## 2023-07-16 DIAGNOSIS — I480 Paroxysmal atrial fibrillation: Secondary | ICD-10-CM

## 2023-07-16 DIAGNOSIS — E1122 Type 2 diabetes mellitus with diabetic chronic kidney disease: Secondary | ICD-10-CM | POA: Diagnosis not present

## 2023-07-16 DIAGNOSIS — K219 Gastro-esophageal reflux disease without esophagitis: Secondary | ICD-10-CM | POA: Diagnosis not present

## 2023-07-16 DIAGNOSIS — Z0000 Encounter for general adult medical examination without abnormal findings: Secondary | ICD-10-CM

## 2023-07-16 DIAGNOSIS — N400 Enlarged prostate without lower urinary tract symptoms: Secondary | ICD-10-CM

## 2023-07-16 DIAGNOSIS — E785 Hyperlipidemia, unspecified: Secondary | ICD-10-CM

## 2023-07-16 DIAGNOSIS — I5032 Chronic diastolic (congestive) heart failure: Secondary | ICD-10-CM

## 2023-07-16 DIAGNOSIS — I350 Nonrheumatic aortic (valve) stenosis: Secondary | ICD-10-CM

## 2023-07-16 DIAGNOSIS — Z951 Presence of aortocoronary bypass graft: Secondary | ICD-10-CM

## 2023-07-16 DIAGNOSIS — E66811 Obesity, class 1: Secondary | ICD-10-CM

## 2023-07-16 DIAGNOSIS — Z7189 Other specified counseling: Secondary | ICD-10-CM

## 2023-07-16 DIAGNOSIS — E1169 Type 2 diabetes mellitus with other specified complication: Secondary | ICD-10-CM | POA: Diagnosis not present

## 2023-07-16 DIAGNOSIS — D649 Anemia, unspecified: Secondary | ICD-10-CM

## 2023-07-16 DIAGNOSIS — G4700 Insomnia, unspecified: Secondary | ICD-10-CM

## 2023-07-16 DIAGNOSIS — E538 Deficiency of other specified B group vitamins: Secondary | ICD-10-CM

## 2023-07-16 DIAGNOSIS — N183 Chronic kidney disease, stage 3 unspecified: Secondary | ICD-10-CM

## 2023-07-16 DIAGNOSIS — E43 Unspecified severe protein-calorie malnutrition: Secondary | ICD-10-CM | POA: Diagnosis not present

## 2023-07-16 DIAGNOSIS — J90 Pleural effusion, not elsewhere classified: Secondary | ICD-10-CM

## 2023-07-16 DIAGNOSIS — E559 Vitamin D deficiency, unspecified: Secondary | ICD-10-CM

## 2023-07-16 DIAGNOSIS — R7989 Other specified abnormal findings of blood chemistry: Secondary | ICD-10-CM

## 2023-07-16 LAB — POC URINALSYSI DIPSTICK (AUTOMATED)
Bilirubin, UA: NEGATIVE
Glucose, UA: NEGATIVE
Ketones, UA: NEGATIVE
Leukocytes, UA: NEGATIVE
Nitrite, UA: NEGATIVE
Protein, UA: NEGATIVE
Spec Grav, UA: 1.02 (ref 1.010–1.025)
Urobilinogen, UA: 0.2 U/dL
pH, UA: 5.5 (ref 5.0–8.0)

## 2023-07-16 MED ORDER — TAMSULOSIN HCL 0.4 MG PO CAPS: 0.4000 mg | ORAL_CAPSULE | Freq: Every day | ORAL | 4 refills | Status: DC

## 2023-07-16 MED ORDER — FUROSEMIDE 40 MG PO TABS
20.0000 mg | ORAL_TABLET | Freq: Every day | ORAL | 3 refills | Status: DC
Start: 2023-07-16 — End: 2023-09-19

## 2023-07-16 MED ORDER — B-12 1000 MCG PO CAPS: 1.0000 | ORAL_CAPSULE | ORAL | Status: AC

## 2023-07-16 MED ORDER — MIRTAZAPINE 15 MG PO TABS: 15.0000 mg | ORAL_TABLET | Freq: Every day | ORAL | 4 refills | Status: DC

## 2023-07-16 MED ORDER — METOPROLOL SUCCINATE ER 25 MG PO TB24
12.5000 mg | ORAL_TABLET | Freq: Every day | ORAL | 4 refills | Status: DC
Start: 2023-07-16 — End: 2023-11-29

## 2023-07-16 NOTE — Progress Notes (Unsigned)
Ph: [phone] Fax: [phone]   Patient ID: [name] Young., male    DOB: 1941/05/24, 82 y.o.   MRN: [id number]  This visit was conducted in person.  BP (!) 164/72   Pulse 66   Temp 97.9 F (36.6 C) (Oral)   Ht 5' 9.25" (1.759 m)   Wt 248 lb 2 oz (112.5 kg)   SpO2 97%   BMI 36.38 kg/m   BP Readings from Last 3 Encounters:  07/16/23 (!) 164/72  05/25/23 138/72  05/24/23 (!) 138/48  BP 168/60s on recheck   CC: AMW Subjective:   HPI: [name] Young. is a 82 y.o. male presenting on 07/16/2023 for Medicare Wellness (Pt accompanied by wife, [name]. )   Did not see health advisor.  Hearing Screening   500Hz  1000Hz  2000Hz  4000Hz   Right ear      Left ear 40 0 0 0  Comments: Wears B hearing aids. Not wearing L hearing aid at today's OV.   Vision Screening - Comments:: Last eye exam, 04/2023.  Flowsheet Row Office Visit from 07/16/2023 in [facility] [place]  PHQ-2 Total Score 0          07/16/2023   10:33 AM 03/05/2023   11:57 AM 01/25/2023   10:38 AM 01/22/2023   10:15 AM 12/26/2022    1:02 PM  Fall Risk   Falls in the past year? 0 0 0 0 0  Number falls in past yr:     0  Injury with Fall?     0  Risk for fall due to :   No Fall Risks    Follow up   Falls evaluation completed     S/p 3v CABG and coronary endarterectomy 01/2018 for severe symptomatic CAD.  Hospitalization 11/2021 for ascending cholangitis with bacteremia s/p ERCP with sphincterotomy and gallstone extraction, complicated by afib and MV endocarditis. Received 6 wks of IV antibiotics via PICC line. Underwent subsequent lap chole by Dr Claudine Mouton 05/2022.   Hospitalization 12/2022 for liver abscess with E coli bacteremia with recurrent R pleural effusion s/p drainage treated with IV ceftriaxone and oral flagyl and liver drain placement.  Saw [facility] ID Dr Rivka Safer in f/u for hepatic abscess, liver drain removed 01/23/2023 and PICC line removed 02/01/2023. F/u PRN.    Seeing pulm for chronic pleural effusion. CT 05/2023 showed moderate recurrent R pleural effusion as well as collapse/consolidation to medial RML and posterior RLL, planned thoracentesis tomorrow.   Notes Farxiga caused recurrent yeast infection.    Preventative: COLONOSCOPY 12/2013 3 polyps, diverticulosis, rec rpt 3 yrs [name]).  COLONOSCOPY 06/2019 - 6 polyps (TA), diverticulosis, f/u left open ended [name]) - decided to age out. No blood in stool  Prostate - yearly check - PSA previously normal. No nocturia. aged out  Lung cancer - not eligible  Flu shot - yearly  COVID vaccine - Pfizer 09/2019 x2, booster x2 06/2020, 01/2021  Pneumovax 09/2011, prevnar-13 04/2014  Td 09/2011  RSV 06/2023 Shingrix - discussed, declines  Advanced directive: would want daughter RN to be HCPOA. Saw lawyer and completed. Asked to bring [place] copy. Does not want prolonged life support. Pt wants to be DNR/DNI.  Seat belt use discussed  Sunscreen use discussed. No changing moles on skin.  Non smoker Alcohol - none  Dentist yearly - needs to reschedule when orthopnea is better  Eye exam yearly  Bowel - no constipation.  Bladder - urge incontinence with leaking if he  waits too long. Doesn't wear depends or pads. Declines medication. 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 Interim medical history since our last visit reviewed. Allergies and medications reviewed and updated. Outpatient Medications Prior to Visit  Medication Sig Dispense Refill  . acetaminophen (TYLENOL) 325 MG tablet Take 1 tablet (325 mg total) by mouth at bedtime as needed (sleep aid).    . Cholecalciferol (VITAMIN D3) 25 MCG (1000  UT) CAPS Take 1 capsule (1,000 Units total) by mouth daily. 30 capsule   . ELIQUIS 5 MG TABS tablet TAKE 1 TABLET BY MOUTH TWICE A DAY 60 tablet 5  . feeding supplement (ENSURE ENLIVE / ENSURE PLUS) LIQD Take 237 mLs by mouth 2 (two) times daily between meals. 14220 mL 0  . glucose blood (TRUE METRIX BLOOD GLUCOSE TEST) test strip Use as instructed to check blood sugar once a day 100 each 3  . Magnesium Citrate 125 MG CAPS Take 250 mg by mouth daily.    . Multiple Vitamins-Minerals (MACULAR VITAMIN BENEFIT PO) Take 1 tablet by mouth daily.    . nitroGLYCERIN (NITROSTAT) 0.4 MG SL tablet Place 1 tablet (0.4 mg total) under the tongue every 5 (five) minutes as needed for chest pain. 25 tablet 4  . rosuvastatin (CRESTOR) 10 MG tablet TAKE 1 TABLET BY MOUTH EVERY DAY 90 tablet 0  . Cyanocobalamin (B-12) 1000 MCG CAPS Take by mouth daily at 2 am.    . furosemide (LASIX) 40 MG tablet Take 0.5 tablets (20 mg total) by mouth daily. 30 tablet 3  . metoprolol succinate (TOPROL-XL) 25 MG 24 hr tablet Take 0.5 tablets (12.5 mg total) by mouth daily. Take with or immediately following a meal. 45 tablet 1  . mirtazapine (REMERON) 15 MG tablet TAKE 1 TABLET BY MOUTH EVERYDAY AT BEDTIME 90 tablet 1  . tamsulosin (FLOMAX) 0.4 MG CAPS capsule TAKE 1 CAPSULE BY MOUTH EVERY DAY 90 capsule 3  . doxycycline (VIBRA-TABS) 100 MG tablet Take 1 tablet (100 mg total) by mouth 2 (two) times daily. 20 tablet 0   No facility-administered medications prior to visit.     Per HPI unless specifically indicated in ROS section below Review of Systems  Constitutional:  Negative for activity change, appetite change, chills, fatigue, fever and unexpected weight change.  HENT:  Negative for hearing loss.   Eyes:  Negative for visual disturbance.  Respiratory:  Positive for cough and shortness of breath. Negative for chest tightness and wheezing.        Orthopnea  Cardiovascular:  Positive for leg swelling. Negative for chest pain  and palpitations.  Gastrointestinal:  Negative for abdominal distention, abdominal pain, blood in stool, constipation, diarrhea, nausea and vomiting.  Genitourinary:  Negative for difficulty urinating and hematuria.  Musculoskeletal:  Negative for arthralgias, myalgias and neck pain.  Skin:  Negative for rash.  Neurological:  Negative for dizziness, seizures, syncope and headaches.  Hematological:  Negative for adenopathy. Bruises/bleeds easily.  Psychiatric/Behavioral:  Positive for dysphoric mood (over presidential election). The patient is not nervous/anxious.     Objective:  BP (!) 164/72   Pulse 66   Temp 97.9 F (36.6 C) (Oral)   Ht 5' 9.25" (1.759 m)  Wt 248 lb 2 oz (112.5 kg)   SpO2 97%   BMI 36.38 kg/m   Wt Readings from Last 3 Encounters:  07/16/23 248 lb 2 oz (112.5 kg)  07/11/23 228 lb (103.4 kg)  05/25/23 228 lb 6 oz (103.6 kg)      Physical Exam Vitals and nursing note reviewed.  Constitutional:      General: He is not in acute distress.    Appearance: Normal appearance. He is well-developed. He is not ill-appearing.  HENT:     Head: Normocephalic and atraumatic.     Right Ear: Hearing, tympanic membrane, ear canal and external ear normal.     Left Ear: Hearing, tympanic membrane, ear canal and external ear normal.     Nose: Nose normal.     Mouth/Throat:     Mouth: Mucous membranes are moist.     Pharynx: Oropharynx is clear. No oropharyngeal exudate or posterior oropharyngeal erythema.  Eyes:     General: No scleral icterus.    Extraocular Movements: Extraocular movements intact.     Conjunctiva/sclera: Conjunctivae normal.     Pupils: Pupils are equal, round, and reactive to light.  Neck:     Thyroid: No thyroid mass or thyromegaly.  Cardiovascular:     Rate and Rhythm: Normal rate and regular rhythm.     Pulses: Normal pulses.          Radial pulses are 2+ on the right side and 2+ on the left side.     Heart sounds: Murmur (3/6 systolic throughout)  heard.  Pulmonary:     Effort: Pulmonary effort is normal. No respiratory distress.     Breath sounds: No wheezing, rhonchi or rales.     Comments:  Diminished breath sounds RLL, crackles LLL Abdominal:     General: Bowel sounds are normal. There is no distension.     Palpations: Abdomen is soft. There is no mass.     Tenderness: There is no abdominal tenderness. There is no guarding or rebound.     Hernia: No hernia is present.  Musculoskeletal:        General: Normal range of motion.     Cervical back: Normal range of motion and neck supple.     Right lower leg: No edema.     Left lower leg: No edema.  Lymphadenopathy:     Cervical: No cervical adenopathy.  Skin:    General: Skin is warm and dry.     Findings: No rash.  Neurological:     General: No focal deficit present.     Mental Status: He is alert and oriented to person, place, and time.     Comments:  Recall 3/3 Calculation WORLD - unable to do   Psychiatric:        Mood and Affect: Mood normal.        Behavior: Behavior normal.        Thought Content: Thought content normal.        Judgment: Judgment normal.      Results for orders placed or performed in visit on 07/16/23  POCT Urinalysis Dipstick (Automated)  Result Value Ref Range   Color, UA yellow    Clarity, UA clear    Glucose, UA Negative Negative   Bilirubin, UA negative    Ketones, UA negative    Spec Grav, UA 1.020 1.010 - 1.025   Blood, UA +/-    pH, UA 5.5 5.0 - 8.0   Protein, UA Negative Negative  Urobilinogen, UA 0.2 0.2 or 1.0 E.U./dL   Nitrite, UA negative    Leukocytes, UA Negative Negative    Assessment & Plan:   Problem List Items Addressed This Visit     Medicare annual wellness visit, subsequent - Primary (Chronic)    I have personally reviewed the Medicare Annual Wellness questionnaire and have noted 1. The patient's medical and social history 2. Their use of alcohol, tobacco or illicit drugs 3. Their current medications and  supplements 4. The patient's functional ability including ADL's, fall risks, home safety risks and hearing or visual impairment. Cognitive function has been assessed and addressed as indicated.  5. Diet and physical activity 6. Evidence for depression or mood disorders The patients weight, height, BMI have been recorded in the chart. I have made referrals, counseling and provided education to the patient based on review of the above and I have provided the pt with a written personalized care plan for preventive services. Provider list updated.. See scanned questionairre as needed for further documentation. Reviewed preventative protocols and updated unless pt declined.       Advanced care planning/counseling discussion (Chronic)    Advanced directive: would want daughter RN to be HCPOA. Saw lawyer and completed. Asked to bring [place] copy. Does not want prolonged life support. Pt wants to be DNR/DNI.       Benign prostatic hyperplasia   Relevant Medications   tamsulosin (FLOMAX) 0.4 MG CAPS capsule   Other Relevant Orders   POCT Urinalysis Dipstick (Automated) (Completed)   Paroxysmal atrial fibrillation (HCC)   Relevant Medications   furosemide (LASIX) 40 MG tablet   metoprolol succinate (TOPROL-XL) 25 MG 24 hr tablet   Chronic diastolic CHF (congestive heart failure) (HCC)   Relevant Medications   furosemide (LASIX) 40 MG tablet   metoprolol succinate (TOPROL-XL) 25 MG 24 hr tablet     Meds ordered this encounter  Medications  . furosemide (LASIX) 40 MG tablet    Sig: Take 0.5 tablets (20 mg total) by mouth daily.    Dispense:  45 tablet    Refill:  3  . metoprolol succinate (TOPROL-XL) 25 MG 24 hr tablet    Sig: Take 0.5 tablets (12.5 mg total) by mouth daily. Take with or immediately following a meal.    Dispense:  45 tablet    Refill:  4  . tamsulosin (FLOMAX) 0.4 MG CAPS capsule    Sig: Take 1 capsule (0.4 mg total) by mouth daily.    Dispense:  90 capsule    Refill:  4   . Cyanocobalamin (B-12) 1000 MCG CAPS    Sig: Take 1 capsule by mouth every Monday, Wednesday, and Friday.  . mirtazapine (REMERON) 15 MG tablet    Sig: Take 1 tablet (15 mg total) by mouth at bedtime.    Dispense:  90 tablet    Refill:  4    Orders Placed This Encounter  Procedures  . POCT Urinalysis Dipstick (Automated)    Patient Instructions  Try crestor and flomax at night. Change B12 to am dosing, and only Mon, Wed, Fri.  Urinalysis today  Bring [place] a copy of your living will.  Increase lasix to whole tablet daily (40mg ) for 5 days then drop back to 1/2 tablet daily. Update [place] with how you do with this.  Monitor blood pressures at home, let me know if consistently >150/90 Return in 3-4 months for follow up visit   Follow up plan: Return in about 4 months (around 11/13/2023)  for follow up visit.  [name], MD

## 2023-07-16 NOTE — Patient Instructions (Addendum)
Try crestor and flomax at night. Change B12 to am dosing, and only Mon, Wed, Fri.  Urinalysis today  Bring [place] a copy of your living will.  Increase lasix to whole tablet daily (40mg ) for 5 days then drop back to 1/2 tablet daily. Update [place] with how you do with this.  Monitor blood pressures at home, let me know if consistently >150/90 Return in 3-4 months for follow up visit

## 2023-07-16 NOTE — Assessment & Plan Note (Signed)
Advanced directive: would wantdaughter RNto beHCPOA.Saw lawyer and completed.Asked to bring [place] copy. Does not want prolonged life support. Pt wants to be DNR/DNI.

## 2023-07-16 NOTE — Assessment & Plan Note (Signed)

## 2023-07-17 ENCOUNTER — Ambulatory Visit
Admission: RE | Admit: 2023-07-17 | Discharge: 2023-07-17 | Disposition: A | Discharge status: Home / Self Care | Payer: Medicare PPO | Source: Ambulatory Visit

## 2023-07-17 ENCOUNTER — Other Ambulatory Visit: Payer: Self-pay

## 2023-07-17 ENCOUNTER — Ambulatory Visit
Admission: RE | Admit: 2023-07-17 | Discharge: 2023-07-17 | Disposition: A | Discharge status: Home / Self Care | Payer: Medicare PPO | Source: Ambulatory Visit | Attending: Pulmonary Disease | Admitting: Pulmonary Disease

## 2023-07-17 DIAGNOSIS — J90 Pleural effusion, not elsewhere classified: Secondary | ICD-10-CM | POA: Insufficient documentation

## 2023-07-17 DIAGNOSIS — E785 Hyperlipidemia, unspecified: Secondary | ICD-10-CM | POA: Insufficient documentation

## 2023-07-17 DIAGNOSIS — Z9889 Other specified postprocedural states: Secondary | ICD-10-CM

## 2023-07-17 DIAGNOSIS — I517 Cardiomegaly: Secondary | ICD-10-CM | POA: Insufficient documentation

## 2023-07-17 DIAGNOSIS — Z48813 Encounter for surgical aftercare following surgery on the respiratory system: Secondary | ICD-10-CM | POA: Diagnosis not present

## 2023-07-17 LAB — BODY FLUID CELL COUNT WITH DIFFERENTIAL
Eos, Fluid: 0 %
Lymphs, Fluid: 40 %
Monocyte-Macrophage-Serous Fluid: 53 %
Neutrophil Count, Fluid: 7 %
Total Nucleated Cell Count, Fluid: 709 uL

## 2023-07-17 LAB — LACTATE DEHYDROGENASE, PLEURAL OR PERITONEAL FLUID: LD, Fluid: 74 U/L — ABNORMAL HIGH (ref 3–23)

## 2023-07-17 LAB — ALBUMIN, PLEURAL OR PERITONEAL FLUID: Albumin, Fluid: 2.8 g/dL

## 2023-07-17 LAB — PROTEIN, PLEURAL OR PERITONEAL FLUID: Total protein, fluid: 5 g/dL

## 2023-07-17 LAB — GLUCOSE, PLEURAL OR PERITONEAL FLUID: Glucose, Fluid: 135 mg/dL

## 2023-07-17 MED ORDER — LIDOCAINE HCL (PF) 1 % IJ SOLN
10.0000 mL | Freq: Once | INTRAMUSCULAR | Status: AC
Start: 2023-07-17 — End: 2023-07-17
  Administered 2023-07-17: 10 mL via INTRADERMAL
  Filled 2023-07-17: qty 10

## 2023-07-17 NOTE — Assessment & Plan Note (Addendum)
Latest kidney function normal range - continue to monitor this.

## 2023-07-17 NOTE — Assessment & Plan Note (Addendum)
Notes ongoing nocturia with hesitancy/incomplete emptying- suggested changing flomax to night time dosing.  Update UA today.  Consider finasteride.

## 2023-07-17 NOTE — Assessment & Plan Note (Signed)
Preventative protocols reviewed and updated unless pt declined. Discussed healthy diet and lifestyle.  

## 2023-07-17 NOTE — Assessment & Plan Note (Signed)
20 lb weight gain noted ?CHF related - increase lasix and re evaluate

## 2023-07-17 NOTE — Assessment & Plan Note (Signed)
Ongoing weight gain noted - will increase lasix to 40mg  daily x 5 days then reassess. If doing well on higher dose, will continue this until seen by cardiology - to update me with effect.

## 2023-07-17 NOTE — Assessment & Plan Note (Signed)
Chronic, continues low dose crestor, now off fibrate triglyceride levels are increasing - continue to monitor.  The ASCVD Risk score (Arnett DK, et al., 2019) failed to calculate for the following reasons:   The 2019 ASCVD risk score is only valid for ages 44 to 77   The patient has a prior MI or stroke diagnosis

## 2023-07-17 NOTE — Assessment & Plan Note (Signed)
Initially thought due to para-pneumonic effusion due to liver abscess however now suspicious for CHF related. This is followed by pulmonology, pending thoracentesis tomorrow.

## 2023-07-17 NOTE — Assessment & Plan Note (Signed)
Stable period off PPI 

## 2023-07-17 NOTE — Assessment & Plan Note (Signed)
Recent TSH/fT4 normal.

## 2023-07-17 NOTE — Assessment & Plan Note (Signed)
Continues eliquis and Toprol XL.

## 2023-07-17 NOTE — Procedures (Signed)
PROCEDURE SUMMARY:  Successful image-guided Right Diagnostic and Therapeutic thoracentesis. Yielded 1.2  liters of clear yellow fluid. Patient tolerated procedure well. EBL: Zero No immediate complications.  Specimen were sent for labs. Post procedure CXR shows no pneumothorax.  Please see imaging section of Epic for full dictation.  Ardith Dark PA-C 07/17/2023 9:22 AM  Patient ID: [name] Merl., male   DOB: August 02, 1941, 82 y.o.   MRN: [id number]

## 2023-07-17 NOTE — Assessment & Plan Note (Signed)
Continue vit D 1000 international units  daily.  

## 2023-07-17 NOTE — Assessment & Plan Note (Signed)
Continue remeron, this helps him sleep as well.

## 2023-07-17 NOTE — Assessment & Plan Note (Signed)
Stable period off medication - diet controlled with latest A1c 6.1%.  Farxiga caused recurrent yeast infections.

## 2023-07-17 NOTE — Assessment & Plan Note (Signed)
Mild, stable.  

## 2023-07-17 NOTE — Assessment & Plan Note (Signed)
Levels now high - will drop B12 replacement to MWF dosing.

## 2023-07-17 NOTE — Assessment & Plan Note (Signed)
Continue remeron.  

## 2023-07-18 LAB — CYTOLOGY - NON PAP

## 2023-07-20 LAB — CHOLESTEROL, BODY FLUID: Cholesterol, Fluid: 61 mg/dL

## 2023-07-20 LAB — BODY FLUID CULTURE W GRAM STAIN: Culture: NO GROWTH

## 2023-08-01 LAB — MISC LABCORP TEST (SEND OUT): Labcorp test code: 9985

## 2023-08-02 ENCOUNTER — Encounter: Payer: Self-pay | Admitting: Pulmonary Disease

## 2023-08-02 ENCOUNTER — Ambulatory Visit: Payer: Medicare PPO | Admitting: Pulmonary Disease

## 2023-08-02 VITALS — BP 150/62 | HR 52 | Ht 70.0 in | Wt 249.4 lb

## 2023-08-02 DIAGNOSIS — J90 Pleural effusion, not elsewhere classified: Secondary | ICD-10-CM | POA: Diagnosis not present

## 2023-08-02 NOTE — Progress Notes (Signed)
[name] Young    [id number]    1941-02-18  Primary Care Physician:Gutierrez, Wynona Canes, MD  Referring Physician: [name], MD [address] [place],  [place] [address]  Chief complaint:   Patient being seen for subacute cough Follow-up for shortness of breath  HPI:  Cough is better  Treated with a course of antibiotics  CT scan had shown some fluid around the lung for which he had a thoracentesis for about 1.2 L Fluid analysis is transudative  Echocardiogram with diastolic dysfunction Not having any chest pains or chest discomfort No fever, no chills  Remote history of smoking cigars here and there, was never really a smoker  No significant occupational exposure worked outdoors, worked for [employer] for many years  History of hypertension, diabetes, hypercholesterolemia, coronary artery disease for which he had bypass in 2018  He does have bilateral leg swelling  Outpatient Encounter Medications as of 08/02/2023  Medication Sig   acetaminophen (TYLENOL) 325 MG tablet Take 1 tablet (325 mg total) by mouth at bedtime as needed (sleep aid).   Cholecalciferol (VITAMIN D3) 25 MCG (1000 UT) CAPS Take 1 capsule (1,000 Units total) by mouth daily.   Cyanocobalamin (B-12) 1000 MCG CAPS Take 1 capsule by mouth every Monday, Wednesday, and Friday.   ELIQUIS 5 MG TABS tablet TAKE 1 TABLET BY MOUTH TWICE A DAY   feeding supplement (ENSURE ENLIVE / ENSURE PLUS) LIQD Take 237 mLs by mouth 2 (two) times daily between meals.   furosemide (LASIX) 40 MG tablet Take 0.5 tablets (20 mg total) by mouth daily.   glucose blood (TRUE METRIX BLOOD GLUCOSE TEST) test strip Use as instructed to check blood sugar once a day   Magnesium Citrate 125 MG CAPS Take 250 mg by mouth daily.   metoprolol succinate (TOPROL-XL) 25 MG 24 hr tablet Take 0.5 tablets (12.5 mg total) by mouth daily. Take with or immediately following a meal.   mirtazapine (REMERON) 15 MG tablet Take 1  tablet (15 mg total) by mouth at bedtime.   Multiple Vitamins-Minerals (MACULAR VITAMIN BENEFIT PO) Take 1 tablet by mouth daily.   nitroGLYCERIN (NITROSTAT) 0.4 MG SL tablet Place 1 tablet (0.4 mg total) under the tongue every 5 (five) minutes as needed for chest pain.   rosuvastatin (CRESTOR) 10 MG tablet TAKE 1 TABLET BY MOUTH EVERY DAY   tamsulosin (FLOMAX) 0.4 MG CAPS capsule Take 1 capsule (0.4 mg total) by mouth daily.   No facility-administered encounter medications on file as of 08/02/2023.    Allergies as of 08/02/2023   (No Known Allergies)    Past Medical History:  Diagnosis Date   Adenomatous colon polyp    Aortic atherosclerosis (HCC)    Arthritis    Ascending aorta dilatation (HCC)    a.) TTE 11/23/2017: asc Ao measured 37 mm; b.) TTE 10/23/2021: Ao root measured 41 mm, asc Ao measured 38 mm; c.) TEE 10/28/2021: asc Ao measured 38 mm; d.) TTE 01/11/2022: Ao root 40 mm, asc Ao 39 mm   Ascending cholangitis 10/2021   BPH (benign prostatic hyperplasia)    Cardiomyopathy (HCC)    CKD (chronic kidney disease), stage III (HCC)    Claustrophobia    Coronary artery disease    a.) LHC 12/21/2017: 75% mLAD, 90% D1, 80-99% OM1/2, CTO pRCA (L-R collaterals) --> CVTS consult. 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  In setting of bacteremia from ascending colangitis   GERD (gastroesophageal reflux disease)    HFrEF (heart failure with reduced ejection fraction) (HCC)    a.) TTE 11/23/2017: EF 50-55%, mod MAC, triv TR, G1DD; b.) TTE 10/23/2021: EF 40-45%, mild LAE, Ao sclerosis, triv MR, G2DD; c.) TEE 10/28/2021: EF 40-45%, glob HK, mobile vegitation on MV; d.) TTE 01/11/2022: EF 50-55%, mild LVH, RVE, Ao sclerosis, mild MR/AR, G1DD   History of cholelithiasis    History of kidney stones    ca ox Vonita Moss @ [facility]) now [employer] [facility]   History of pneumonia    HLD (hyperlipidemia)    HTN (hypertension)    Jaundice    age 18    Long term current use of anticoagulant    a.) apixaban   Paroxysmal atrial fibrillation (HCC)    a.) CHA2DS2VASc = 6 (age x 2, HFrEF, HTN, vascular disease history, T2DM);  b.) rate/rhythm maintained on oral metoprolol succinate; chronically anticoagulated with apixaban   Pneumonia    Right-sided carotid artery disease (HCC)    a.) carotid doppler 04/05/2022: 1-39% RICA   S/P CABG x 3    a.) LIMA-LAD, SVG-diagonal, SVG-PL branch of RCA   S/P cataract extraction and insertion of intraocular lens    T2DM (type 2 diabetes mellitus) (HCC) 2010    Past Surgical History:  Procedure Laterality Date   CARPAL TUNNEL RELEASE Bilateral    CATARACT EXTRACTION, BILATERAL     COLONOSCOPY  11/2012   12 adenomatous polyps, diverticulosis, rec rpt 1 yr [name])   COLONOSCOPY  12/2013   3 polyps, diverticulosis, rec rpt 3 yrs [name])   COLONOSCOPY  06/2019   6 polyps (TA), diverticulosis, f/u left open ended [name])   CORONARY ARTERY BYPASS GRAFT N/A 01/18/2018   Procedure: CORONARY ARTERY BYPASS GRAFTING (CABG) x 3; Using Left Internal Mammary Artery, and Right Greater Saphenous Vein harvested Endoscopically, Coronary Artery Endarterectomy;  Surgeon: Kerin Perna, MD;  Location: [facility] OR;  Service: Open Heart Surgery;  Laterality: N/A;   ERCP N/A 10/23/2021   Procedure: ENDOSCOPIC RETROGRADE CHOLANGIOPANCREATOGRAPHY (ERCP);  Surgeon: Meryl Dare, MD;  Location: [facility] ENDOSCOPY;  Service: Endoscopy;  Laterality: N/A;   IR CATHETER TUBE CHANGE  01/08/2023   IR RADIOLOGIST EVAL & MGMT  01/23/2023   IR THORACENTESIS ASP PLEURAL SPACE W/IMG GUIDE  12/29/2022   KNEE CARTILAGE SURGERY Left    LEFT HEART CATH AND CORONARY ANGIOGRAPHY N/A 12/21/2017   Procedure: LEFT HEART CATH AND CORONARY ANGIOGRAPHY;  Surgeon: Yvonne Kendall, MD;  Location: [facility] INVASIVE CV LAB;  Service: Cardiovascular;  Laterality: N/A;   LITHOTRIPSY     REMOVAL OF STONES  10/23/2021   Procedure: REMOVAL OF STONES;  Surgeon: Meryl Dare, MD;  Location: [facility] ENDOSCOPY;  Service: Endoscopy;;   SPHINCTEROTOMY  10/23/2021   Procedure: Dennison Mascot;  Surgeon: Meryl Dare, MD;  Location: [facility] ENDOSCOPY;  Service: Endoscopy;;   TEE WITHOUT CARDIOVERSION N/A 01/18/2018   Procedure: TRANSESOPHAGEAL ECHOCARDIOGRAM (TEE);  Surgeon: Donata Clay, Theron Arista, MD;  Location: [facility] OR;  Service: Open Heart Surgery;  Laterality: N/A;   TEE WITHOUT CARDIOVERSION N/A 10/28/2021   Procedure: TRANSESOPHAGEAL ECHOCARDIOGRAM (TEE);  Surgeon: Chrystie Nose, MD;  Location: [facility] ENDOSCOPY;  Service: Cardiovascular;  Laterality: N/A;   UMBILICAL HERNIA REPAIR     with mesh    Family History  Problem Relation Age of Onset   Alzheimer's disease Mother    Breast cancer Mother        breast  Atrial fibrillation Mother    CAD Mother    Stroke Father 9   Diabetes Father    CAD Father    Colon polyps Sister    Colon polyps Brother    Rectal cancer Maternal Grandfather        rectal   Colon cancer Maternal Grandfather 30   Esophageal cancer Neg Hx    Stomach cancer Neg Hx     Social History   Socioeconomic History   Marital status: Married    Spouse name: Not on file   Number of children: Not on file   Years of education: Not on file   Highest education level: 12th grade  Occupational History   Not on file  Tobacco Use   Smoking status: Never    Passive exposure: Past   Smokeless tobacco: Former    Types: Chew    Quit date: 09/08/2001  Vaping Use   Vaping status: Never Used  Substance and Sexual Activity   Alcohol use: Not Currently    Comment: occ   Drug use: No   Sexual activity: Not Currently  Other Topics Concern   Not on file  Social History Narrative   Caffeine: occasional Lives with wife, grandson [name] 2009)Occupation: retired, 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.Diet: some water, fruits/vegetables daily, avoids potatoes   Social Determinants of Health   Financial Resource Strain: Low Risk   (12/26/2022)   Overall Financial Resource Strain (CARDIA)    Difficulty of Paying Living Expenses: Not hard at all  Food Insecurity: Unknown (01/19/2023)   Hunger Vital Sign    Worried About Running Out of Food in the Last Year: Never true    Ran Out of Food in the Last Year: Patient declined  Transportation Needs: No Transportation Needs (01/19/2023)   PRAPARE - Administrator, Civil Service (Medical): No    Lack of Transportation (Non-Medical): No  Physical Activity: Unknown (12/26/2022)   Exercise Vital Sign    Days of Exercise per Week: Patient declined    Minutes of Exercise per Session: Not on file  Stress: No Stress Concern Present (12/26/2022)   [employer] of Occupational Health - Occupational Stress Questionnaire    Feeling of Stress : Not at all  Social Connections: Unknown (12/26/2022)   Social Connection and Isolation Panel [NHANES]    Frequency of Communication with Friends and Family: Patient declined    Frequency of Social Gatherings with Friends and Family: Patient declined    Attends Religious Services: Patient declined    Database administrator or Organizations: Yes    Attends Banker Meetings: Patient declined    Marital Status: Married  Catering manager Violence: Not on file    Review of Systems  Constitutional:  Positive for fatigue.  Respiratory:  Positive for cough and shortness of breath.     Vitals:   08/02/23 1029  BP: (!) 150/62  Pulse: (!) 52  SpO2: 96%      Physical Exam Constitutional:      Appearance: He is obese.  HENT:     Head: Normocephalic.     Mouth/Throat:     Mouth: Mucous membranes are moist.  Eyes:     General: No scleral icterus. Cardiovascular:     Rate and Rhythm: Normal rate and regular rhythm.     Heart sounds: No murmur heard.    No friction rub.  Pulmonary:     Effort: No respiratory distress.  Breath sounds: No stridor. No wheezing or rhonchi.     Comments: Decreased air entry right  base Musculoskeletal:     Cervical back: No rigidity or tenderness.  Neurological:     Mental Status: He is alert.  Psychiatric:        Mood and Affect: Mood normal.    Data Reviewed: PFT from 2019 with no obstruction, no restriction, normal diffusing capacity  CT scan from August 2023, CT of the abdomen did show a right pleural effusion  Recent chest x-ray 10/11/2022 with right pleural effusion, prominent bronchovascular markings-reviewed with the patient-diastolic failure  PFT reviewed showing a suboptimal study, mild obstruction, lung capacity could not be performed -Stable compared to one performed in 2019 Chest x-ray postthoracentesis reviewed showing resolution of pleural effusion  Fluid analysis is transudative, cytology have been negative over the 3 thoracentesis that he's had  Assessment:  Chronic cough -This appears to be better  Bronchitis -Treated  Recurrent pleural effusion -Last evaluation was negative, transudative fluid -Likely related to decompensated diastolic heart failure  Diastolic heart failure -Continue to follow-up with cardiology  History of chronic heart failure-diastolic heart failure -Recently had his diuretics increased   Plan/Recommendations: Encouraged graded activities as tolerated  Continue diuresis  Follow-up in about 3 months  Encouraged to call with significant concerns   Virl Diamond MD [facility] 08/02/2023, 10:51 AM  CC: [name], MD

## 2023-08-02 NOTE — Patient Instructions (Signed)
I will see you in about 3 months  Call us if any unusual shortness of breath It may mean that fluid has built back up around the lungs  Continue your water pills  Continue exercising as tolerated  The testing on the fluid recently did not show anything of concern other than it is likely related to the heart

## 2023-08-24 ENCOUNTER — Other Ambulatory Visit: Payer: Self-pay | Admitting: Family Medicine

## 2023-08-24 DIAGNOSIS — I480 Paroxysmal atrial fibrillation: Secondary | ICD-10-CM

## 2023-08-24 NOTE — Telephone Encounter (Signed)
Rx sent 07/16/23, #45/4 additional refills (taking 0.5 tabs daily) to [facility].  Request denied.

## 2023-09-13 ENCOUNTER — Ambulatory Visit (INDEPENDENT_AMBULATORY_CARE_PROVIDER_SITE_OTHER): Payer: Medicare PPO | Admitting: Podiatry

## 2023-09-13 ENCOUNTER — Encounter: Payer: Self-pay | Admitting: Podiatry

## 2023-09-13 DIAGNOSIS — E1142 Type 2 diabetes mellitus with diabetic polyneuropathy: Secondary | ICD-10-CM | POA: Diagnosis not present

## 2023-09-13 DIAGNOSIS — M79675 Pain in left toe(s): Secondary | ICD-10-CM | POA: Diagnosis not present

## 2023-09-13 DIAGNOSIS — M79674 Pain in right toe(s): Secondary | ICD-10-CM | POA: Diagnosis not present

## 2023-09-13 DIAGNOSIS — B351 Tinea unguium: Secondary | ICD-10-CM

## 2023-09-15 ENCOUNTER — Other Ambulatory Visit: Payer: Self-pay | Admitting: Internal Medicine

## 2023-09-17 ENCOUNTER — Ambulatory Visit
Admission: RE | Admit: 2023-09-17 | Discharge: 2023-09-17 | Disposition: A | Discharge status: Home / Self Care | Payer: Medicare PPO | Attending: Internal Medicine | Admitting: Internal Medicine

## 2023-09-17 ENCOUNTER — Encounter: Payer: Self-pay | Admitting: Internal Medicine

## 2023-09-17 ENCOUNTER — Ambulatory Visit: Payer: Medicare PPO | Attending: Internal Medicine | Admitting: Internal Medicine

## 2023-09-17 ENCOUNTER — Ambulatory Visit
Admission: RE | Admit: 2023-09-17 | Discharge: 2023-09-17 | Disposition: A | Discharge status: Home / Self Care | Payer: Medicare PPO | Source: Ambulatory Visit | Attending: Internal Medicine | Admitting: Internal Medicine

## 2023-09-17 VITALS — BP 142/52 | HR 80 | Ht 70.0 in | Wt 249.2 lb

## 2023-09-17 DIAGNOSIS — I700 Atherosclerosis of aorta: Secondary | ICD-10-CM | POA: Diagnosis not present

## 2023-09-17 DIAGNOSIS — J90 Pleural effusion, not elsewhere classified: Secondary | ICD-10-CM | POA: Insufficient documentation

## 2023-09-17 DIAGNOSIS — I480 Paroxysmal atrial fibrillation: Secondary | ICD-10-CM

## 2023-09-17 DIAGNOSIS — I2510 Atherosclerotic heart disease of native coronary artery without angina pectoris: Secondary | ICD-10-CM

## 2023-09-17 DIAGNOSIS — R0602 Shortness of breath: Secondary | ICD-10-CM

## 2023-09-17 DIAGNOSIS — I493 Ventricular premature depolarization: Secondary | ICD-10-CM | POA: Diagnosis not present

## 2023-09-17 DIAGNOSIS — R918 Other nonspecific abnormal finding of lung field: Secondary | ICD-10-CM | POA: Diagnosis not present

## 2023-09-17 DIAGNOSIS — I5022 Chronic systolic (congestive) heart failure: Secondary | ICD-10-CM

## 2023-09-17 NOTE — Telephone Encounter (Signed)
 last office visit: 05/24/23 with plan to f/u in 3 months. next office visit: 09/17/23

## 2023-09-17 NOTE — Progress Notes (Signed)
 Cardiology Office Note:  .   Date:  09/17/2023  ID:  [name] Young., DOB 1941-01-22, MRN [id number] PCP: Rilla Baller, MD  [facility] Providers Cardiologist:  Lonni Hanson, MD     History of Present Illness: .   [name] Young. is a 83 y.o. male with history of three-vessel CAD status post CABG (01/2018) complicated by postoperative atrial fibrillation (transient atrial fibrillation recurred in the setting of sepsis related to ascending cholangitis), ascending cholangitis complicated by recurrent atrial fibrillation, acute HFrEF (LVEF 40-45%), and mitral valve endocarditis, as well as hypertension, hyperlipidemia, type 2 diabetes mellitus, hepatic abscess, and chronic kidney disease stage III, who presents for follow-up of coronary artery disease, atrial fibrillation, and endocarditis.  I last saw him in September, at which time he was most concerned about recent onset of pain and purulent discharge from his left great toe for which she has been following with podiatry.  Chronic exertional dyspnea was mild and unchanged from prior visits as was his chronic fatigue.  I prescribed Augmentin  and encouraged him to see his PCP for further workup of his toe infection.  We otherwise did not pursue additional testing.  He has followed up with pulmonology in regard to his recurrent right pleural effusion, having undergone thoracentesis in early November.  Fluid analyses reportedly were consistent with a transudative effusion.  His pulmonologist attributed the effusion to decompensated diastolic heart failure.  Today, [name] has multiple complaints, including fatigue with minimal activity such as walking around his yard feeding birds.  He also got out of breath walking into the office today.  This had improved after his thoracentesis, though it has gotten worse again over the last week or two.  He also reports a cough over the last week or 2 with clear/white sputum production.  His  other main complaint is of back pain when walking through his yard.  He also had some right sided abdominal pain a week or 2 ago, similar to what he felt around the time of his hepatic abscess drainage.  The symptoms resolved.  His bowels have been irregular over the last 3 to 4 weeks.  He denies bleeding, including in his stool.  He has not had any palpitations or lightheadedness.  ROS: See HPI  Studies Reviewed: SABRA   EKG Interpretation Date/Time:  Monday September 17 2023 13:46:33 EST Ventricular Rate:  80 PR Interval:  152 QRS Duration:  86 QT Interval:  376 QTC Calculation: 433 R Axis:   8  Text Interpretation: Sinus rhythm with Premature ventricular complexes Nonspecific ST abnormality Abnormal ECG When compared with ECG of 24-May-2023 13:14, No significant change was found Confirmed by Willetta York, Lonni [phone]) on 09/17/2023 1:54:44 PM    TTE (12/30/2022): Normal LV size and wall thickness.  LVEF 50-55% with normal wall motion and grade 1 diastolic dysfunction.  Normal RV size and function.  Normal biatrial size.  No pericardial effusion.  Mitral annular calcification with mild regurgitation.  Normal mitral valve gradient.  Mild tricuspid regurgitation.  Mild aortic regurgitation and mild to moderate aortic stenosis (mean gradient 15 mmHg).  Mild dilation of the aortic root at 4.0 cm.  Risk Assessment/Calculations:     HYPERTENSION CONTROL Vitals:   09/17/23 1339 09/17/23 1405  BP: 102/60 (!) 142/52    The patient's blood pressure is elevated above target today.  In order to address the patient's elevated BP: Blood pressure will be monitored at home to determine if medication changes need to  be made.          Physical Exam:   VS:  BP (!) 142/52 (BP Location: Left Arm, Patient Position: Sitting, Cuff Size: Large)   Pulse 80   Ht 5' 10 (1.778 m)   Wt 249 lb 3.2 oz (113 kg)   SpO2 96%   BMI 35.76 kg/m    Wt Readings from Last 3 Encounters:  09/17/23 249 lb 3.2 oz (113 kg)   08/02/23 249 lb 6.4 oz (113.1 kg)  07/16/23 248 lb 2 oz (112.5 kg)    General:  NAD. Neck: No JVD or HJR. Lungs: Decreased breath sounds at the right lung base.  No wheezes or crackles. Heart: Bradycardic and somewhat irregular without murmurs.  No rubs or gallops. Abdomen: Obese but soft and nontender. Extremities: No lower extremity edema.  ASSESSMENT AND PLAN: .    Chronic heart failure with recovered ejection fraction, shortness of breath, and pleural effusion: [name] appears euvolemic on exam but has progressive shortness of breath over the last couple of weeks.  He had felt better after his right thoracentesis in the fall.  Diminished breath sounds noted on the right side concerning for recurrent pleural effusion.  His pulmonologist believes that diastolic heart failure is contributing to recurrent pleural effusions, though [name] otherwise does not appear to have significant fluid retention.  We will repeat a chest radiograph today.  I will also check a CBC, CMP, and BNP.  Continue current dose of furosemide  for now, though we will need to consider escalating this if BNP is elevated or there is evidence of significant fluid overload on chest radiograph.  Ultimately, if symptoms do not improve and etiology of pleural effusion remains unclear, we will need to proceed with right and left heart catheterization to better define [name] hemodynamics and ensure that he is not have progression of his CAD.  Coronary artery disease: No angina reported.  Query if exertional dyspnea is an anginal equivalent, though this in isolation and would not explain his recurrent right pleural effusion.  Frequent PVCs noted today.  As above, we will check labs and a chest radiograph today.  If symptoms do not improve, we will need to move forward with right and left heart catheterization.  Continue apixaban  and lieu of aspirin  for secondary prevention as well as rosuvastatin  10 mg daily in the setting  of good LDL control on last check in October and intolerance of more aggressive lipid therapy in the past.  Frequent PVCs: Again noted on EKG today.  Question if this is contributing to fatigue or a sign of underlying progression of CAD/cardiomyopathy.  Check CMP and magnesium  level today.  Low threshold to perform catheterization if no other explanation for PVC/fatigue identified.  Continue metoprolol  succinate 12.5 mg daily.  Paroxysmal atrial fibrillation: No evidence of recurrent atrial fibrillation.  Continue apixaban  and low-dose metoprolol .    Dispo: Return to clinic in 2 months.  Signed, Lonni Hanson, MD

## 2023-09-17 NOTE — Progress Notes (Signed)
  Subjective:  Patient ID: [name] Young., male    DOB: March 22, 1941,  MRN: [id number]  83 y.o. male presents with at risk foot care with history of diabetic neuropathy and painful thick toenails that are difficult to trim. Pain interferes with ambulation. Aggravating factors include wearing enclosed shoe gear. Pain is relieved with periodic professional debridement.   PCP: Rilla Baller, MD.  New problem(s): None.   Review of Systems: Negative except as noted in the HPI.   No Known Allergies  Objective:  There were no vitals filed for this visit. Constitutional Patient is a pleasant 83 y.o. male in NAD. AAO x 3.  Vascular Capillary fill time to digits <3 seconds.  DP/PT pulse(s) are faintly palpable b/l lower extremities. Pedal hair absent b/l. Lower extremity skin temperature gradient warm to warm b/l. No pain with calf compression b/l. No cyanosis or clubbing noted. No ischemia nor gangrene noted b/l.   Neurologic Protective sensation diminished with 10g monofilament b/l.  Dermatologic Pedal skin is thin, shiny and atrophic b/l.  No open wounds b/l lower extremities. No interdigital macerations b/l lower extremities. Toenails 1-5 b/l elongated, discolored, dystrophic, thickened, crumbly with subungual debris and tenderness to dorsal palpation.   Orthopedic: Normal muscle strength 5/5 to all lower extremity muscle groups bilaterally. Uses walking stick for ambulation assistance.   Last HgA1c:     Latest Ref Rng & Units 07/09/2023    7:42 AM 12/26/2022    2:05 PM  Hemoglobin A1C  Hemoglobin-A1c 4.6 - 6.5 % 6.1  5.8      Assessment:   1. Pain due to onychomycosis of toenails of both feet   2. Diabetic polyneuropathy associated with type 2 diabetes mellitus (HCC)    Plan:  Patient was evaluated and treated and all questions answered. Consent given for treatment as described below: -Continue foot and shoe inspections daily. Monitor blood glucose per PCP/Endocrinologist's  recommendations. -Toenails 1-5 b/l were debrided in length and girth with sterile nail nippers and dremel without iatrogenic bleeding.  -Patient/POA to call should there be question/concern in the interim.  Return in about 3 months (around 12/12/2023).  Delon LITTIE Merlin, DPM       LOCATION: [address] [place] [address]                   Office [phone]   [facility] LOCATION: [address] [place], [place] [address] Office [phone]

## 2023-09-17 NOTE — Patient Instructions (Signed)
 Medication Instructions:  Your physician recommends that you continue on your current medications as directed. Please refer to the Current Medication list given to you today.   *If you need a refill on your cardiac medications before your next appointment, please call your pharmacy*   Lab Work: Your provider would like for you to have following labs drawn today (CBC w dif, CMP, Mg, BNP).     Testing/Procedures: Your provider has ordered a chest X-Ray for you. You can have this done at the [facility] medical mall. You do not need an appointment. Please go to the entrance of the Medical Mall and check in at the front desk.   Follow-Up: At [facility], you and your health needs are our priority.  As part of our continuing mission to provide you with exceptional heart care, we have created designated Provider Care Teams.  These Care Teams include your primary Cardiologist (physician) and Advanced Practice Providers (APPs -  Physician Assistants and Nurse Practitioners) who all work together to provide you with the care you need, when you need it.  We recommend signing up for the patient portal called MyChart.  Sign up information is provided on this After Visit Summary.  MyChart is used to connect with patients for Virtual Visits (Telemedicine).  Patients are able to view lab/test results, encounter notes, upcoming appointments, etc.  Non-urgent messages can be sent to your provider as well.   To learn more about what you can do with MyChart, go to [email or web address].    Your next appointment:   2 month(s)  Provider:   You will see one of the following Advanced Practice Providers on your designated Care Team:   Lonni Meager, NP Bernardino Bring, PA-C Cadence Franchester, PA-C Tylene Lunch, NP Barnie Hila, NP

## 2023-09-18 ENCOUNTER — Other Ambulatory Visit: Payer: Self-pay | Admitting: Internal Medicine

## 2023-09-18 DIAGNOSIS — I480 Paroxysmal atrial fibrillation: Secondary | ICD-10-CM

## 2023-09-18 LAB — COMPREHENSIVE METABOLIC PANEL
ALT: 25 [IU]/L (ref 0–44)
AST: 28 [IU]/L (ref 0–40)
Albumin: 3.6 g/dL — ABNORMAL LOW (ref 3.7–4.7)
Alkaline Phosphatase: 87 [IU]/L (ref 44–121)
BUN/Creatinine Ratio: 20 (ref 10–24)
BUN: 24 mg/dL (ref 8–27)
Bilirubin Total: 0.4 mg/dL (ref 0.0–1.2)
CO2: 24 mmol/L (ref 20–29)
Calcium: 9 mg/dL (ref 8.6–10.2)
Chloride: 103 mmol/L (ref 96–106)
Creatinine, Ser: 1.23 mg/dL (ref 0.76–1.27)
Globulin, Total: 3 g/dL (ref 1.5–4.5)
Glucose: 119 mg/dL — ABNORMAL HIGH (ref 70–99)
Potassium: 4.7 mmol/L (ref 3.5–5.2)
Sodium: 142 mmol/L (ref 134–144)
Total Protein: 6.6 g/dL (ref 6.0–8.5)
eGFR: 59 mL/min/{1.73_m2} — ABNORMAL LOW (ref >59)

## 2023-09-18 LAB — CBC WITH DIFFERENTIAL/PLATELET
Basophils Absolute: 0 10*3/uL (ref 0.0–0.2)
Basos: 0 %
EOS (ABSOLUTE): 0.1 10*3/uL (ref 0.0–0.4)
Eos: 1 %
Hematocrit: 38.9 % (ref 37.5–51.0)
Hemoglobin: 12 g/dL — ABNORMAL LOW (ref 13.0–17.7)
Immature Grans (Abs): 0.1 10*3/uL (ref 0.0–0.1)
Immature Granulocytes: 1 %
Lymphocytes Absolute: 2.4 10*3/uL (ref 0.7–3.1)
Lymphs: 26 %
MCH: 27.7 pg (ref 26.6–33.0)
MCHC: 30.8 g/dL — ABNORMAL LOW (ref 31.5–35.7)
MCV: 90 fL (ref 79–97)
Monocytes Absolute: 0.7 10*3/uL (ref 0.1–0.9)
Monocytes: 8 %
Neutrophils Absolute: 5.7 10*3/uL (ref 1.4–7.0)
Neutrophils: 64 %
Platelets: 278 10*3/uL (ref 150–450)
RBC: 4.33 x10E6/uL (ref 4.14–5.80)
RDW: 14.4 % (ref 11.6–15.4)
WBC: 9 10*3/uL (ref 3.4–10.8)

## 2023-09-18 LAB — MAGNESIUM: Magnesium: 2 mg/dL (ref 1.6–2.3)

## 2023-09-18 LAB — BRAIN NATRIURETIC PEPTIDE: BNP: 103.3 pg/mL — ABNORMAL HIGH (ref 0.0–100.0)

## 2023-09-19 ENCOUNTER — Other Ambulatory Visit: Payer: Self-pay

## 2023-09-19 DIAGNOSIS — I5032 Chronic diastolic (congestive) heart failure: Secondary | ICD-10-CM

## 2023-09-19 MED ORDER — FUROSEMIDE 40 MG PO TABS: 40.0000 mg | ORAL_TABLET | Freq: Every day | ORAL | 11 refills | Status: DC

## 2023-09-19 NOTE — Telephone Encounter (Signed)
 Prescription refill request for Eliquis received. Indication:afib Last office visit:1/25 Scr:1.23  1/25 Age: 83 Weight:113  kg  Prescription refilled

## 2023-09-21 ENCOUNTER — Encounter: Payer: Self-pay | Admitting: Pulmonary Disease

## 2023-10-02 ENCOUNTER — Other Ambulatory Visit: Payer: Self-pay | Admitting: *Deleted

## 2023-10-02 DIAGNOSIS — I5032 Chronic diastolic (congestive) heart failure: Secondary | ICD-10-CM

## 2023-10-03 DIAGNOSIS — I5032 Chronic diastolic (congestive) heart failure: Secondary | ICD-10-CM | POA: Diagnosis not present

## 2023-10-04 LAB — BASIC METABOLIC PANEL
BUN/Creatinine Ratio: 24 (ref 10–24)
BUN: 30 mg/dL — ABNORMAL HIGH (ref 8–27)
CO2: 25 mmol/L (ref 20–29)
Calcium: 8.9 mg/dL (ref 8.6–10.2)
Chloride: 101 mmol/L (ref 96–106)
Creatinine, Ser: 1.25 mg/dL (ref 0.76–1.27)
Glucose: 228 mg/dL — ABNORMAL HIGH (ref 70–99)
Potassium: 4.7 mmol/L (ref 3.5–5.2)
Sodium: 141 mmol/L (ref 134–144)
eGFR: 58 mL/min/{1.73_m2} — ABNORMAL LOW (ref >59)

## 2023-10-08 ENCOUNTER — Other Ambulatory Visit: Payer: Self-pay | Admitting: Family Medicine

## 2023-10-08 ENCOUNTER — Ambulatory Visit (INDEPENDENT_AMBULATORY_CARE_PROVIDER_SITE_OTHER)
Admission: RE | Admit: 2023-10-08 | Discharge: 2023-10-08 | Disposition: A | Discharge status: Home / Self Care | Payer: Medicare PPO | Source: Ambulatory Visit | Attending: Family Medicine | Admitting: Family Medicine

## 2023-10-08 ENCOUNTER — Ambulatory Visit: Payer: Medicare PPO | Admitting: Family Medicine

## 2023-10-08 ENCOUNTER — Telehealth: Payer: Self-pay | Admitting: Pulmonary Disease

## 2023-10-08 ENCOUNTER — Ambulatory Visit: Payer: Self-pay | Admitting: Family Medicine

## 2023-10-08 VITALS — BP 134/64 | HR 41 | Temp 97.7°F | Ht 70.0 in | Wt 245.5 lb

## 2023-10-08 DIAGNOSIS — I480 Paroxysmal atrial fibrillation: Secondary | ICD-10-CM | POA: Diagnosis not present

## 2023-10-08 DIAGNOSIS — R0600 Dyspnea, unspecified: Secondary | ICD-10-CM

## 2023-10-08 DIAGNOSIS — J90 Pleural effusion, not elsewhere classified: Secondary | ICD-10-CM

## 2023-10-08 DIAGNOSIS — Z951 Presence of aortocoronary bypass graft: Secondary | ICD-10-CM

## 2023-10-08 DIAGNOSIS — I493 Ventricular premature depolarization: Secondary | ICD-10-CM | POA: Diagnosis not present

## 2023-10-08 DIAGNOSIS — R001 Bradycardia, unspecified: Secondary | ICD-10-CM | POA: Diagnosis not present

## 2023-10-08 DIAGNOSIS — I350 Nonrheumatic aortic (valve) stenosis: Secondary | ICD-10-CM

## 2023-10-08 DIAGNOSIS — I5022 Chronic systolic (congestive) heart failure: Secondary | ICD-10-CM | POA: Diagnosis not present

## 2023-10-08 DIAGNOSIS — R531 Weakness: Secondary | ICD-10-CM

## 2023-10-08 DIAGNOSIS — I2510 Atherosclerotic heart disease of native coronary artery without angina pectoris: Secondary | ICD-10-CM

## 2023-10-08 DIAGNOSIS — J9811 Atelectasis: Secondary | ICD-10-CM | POA: Diagnosis not present

## 2023-10-08 NOTE — Telephone Encounter (Signed)
PT's wife calling saying she needs an appt ASAP. Adv only have an appt @ [facility]. She dcln and asked me about [facility]. They had appts tomorrow. She said she will call her husbands PCP. NFN.

## 2023-10-08 NOTE — Telephone Encounter (Signed)
Patient was transferred to office offered openings for other providers in office, however patients wife declined.  Triage nurse would like patient seen in next 4 hours

## 2023-10-08 NOTE — Patient Instructions (Addendum)
Chest xray today  I will try to set you up for thoracentesis (remove fluid out of right lung) in [place]. We will contact you for appointment. In the meantime try to schedule sooner follow up with Dr Wynona Neat.

## 2023-10-08 NOTE — Telephone Encounter (Signed)
I spoke with pts wife DPR signed and pt cough and SOB has worsened. Pts wife said from last cxr rt lung is collecting fluid.pt has been taking furosemide 40 mg daily with no relief of prod cough with white phlegm or SOB.pts wife has sent my chart to pulmonology but has not heard back yet. Pt only wants to see Dr [name] or Dr Para March. I spoke with Dr [name] and he will see pt today at 1:00 pm with ED precautions given and pts wife voiced understanding. Pt wife was apologetic and appreciative of appt. Pts wife said she is just worried about her husband. Sending note to Dr [name] and [name] pool.

## 2023-10-08 NOTE — Telephone Encounter (Signed)
  Chief Complaint: requesting thoracentesis Symptoms: cough, mild SOB Frequency: ongoing x 1 year, worsening Pertinent Negatives: Patient denies fever, chest pain, wheezing, runny nose Disposition: [] ED /[] Urgent Care (no appt availability in office) / [x] Appointment(In office/virtual)/ []  [facility]/ [] Home Care/ [] Refused Recommended Disposition /[] [facility]/ []  Follow-up with PCP Additional Notes: Patient's wife on phone for triage, [name]. She became verbally aggressive and calling triage RN "stupid or dumb" for asking triage questions. She became very upset when told Dr Reece Agar. Did not have availability today. Connected patient with [facility].  Copied from CRM [id number]. Topic: Clinical - Red Word Triage >> Oct 08, 2023  8:33 AM Isabell A wrote: Red Word that prompted transfer to Nurse Triage: Spouse calling to schedule an appointment for fluid in his right lung. Reason for Disposition  [1] MILD difficulty breathing (e.g., minimal/no SOB at rest, SOB with walking, pulse <100) AND [2] still present when not coughing  (Exception: No change from usual, chronic shortness of breath.)  Answer Assessment - Initial Assessment Questions 1. ONSET: "When did the cough begin?"      X 1 year.  2. SEVERITY: "How bad is the cough today?"      Coughing causes some gagging and it is off and on 24/7.  3. SPUTUM: "Describe the color of your sputum" (none, dry cough; clear, white, yellow, green)     White.  4. HEMOPTYSIS: "Are you coughing up any blood?" If so ask: "How much?" (flecks, streaks, tablespoons, etc.)     Denies.  5. DIFFICULTY BREATHING: "Are you having difficulty breathing?" If Yes, ask: "How bad is it?" (e.g., mild, moderate, severe)    - MILD: No SOB at rest, mild SOB with walking, speaks normally in sentences, can lie down, no retractions, pulse < 100.    - MODERATE: SOB at rest, SOB with minimal exertion and prefers to sit, cannot lie down flat, speaks in phrases, mild  retractions, audible wheezing, pulse 100-120.    - SEVERE: Very SOB at rest, speaks in single words, struggling to breathe, sitting hunched forward, retractions, pulse > 120      Mild, he has his spells when sometimes it is worse than other times.  6. FEVER: "Do you have a fever?" If Yes, ask: "What is your temperature, how was it measured, and when did it start?"     Denies.  7. CARDIAC HISTORY: "Do you have any history of heart disease?" (e.g., heart attack, congestive heart failure)      Bypass surgery.  8. LUNG HISTORY: "Do you have any history of lung disease?"  (e.g., pulmonary embolus, asthma, emphysema)     Denies.  9. PE RISK FACTORS: "Do you have a history of blood clots?" (or: recent major surgery, recent prolonged travel, bedridden)     Denies.  10. OTHER SYMPTOMS: "Do you have any other symptoms?" (e.g., runny nose, wheezing, chest pain)       Denies.  11. TRAVEL: "Have you traveled out of the country in the last month?" (e.g., travel history, exposures)       Denies.  Protocols used: Cough - Chronic-A-AH

## 2023-10-08 NOTE — Telephone Encounter (Signed)
Pt added to Dr Timoteo Expose schedule today at 1:00.

## 2023-10-08 NOTE — Progress Notes (Unsigned)
Ph: [phone] Fax: [phone]   Patient ID: [name] Merl., male    DOB: December 20, 1940, 83 y.o.   MRN: [id number]  This visit was conducted in person.  BP 134/64   Pulse (!) 41   Temp 97.7 F (36.5 C) (Oral)   Ht 5\' 10"  (1.778 m)   Wt 245 lb 8 oz (111.4 kg)   SpO2 96%   BMI 35.23 kg/m    CC: worsening cough, dyspnea Subjective:   HPI: [name] Arterberry. is a 83 y.o. male presenting on 10/08/2023 for Medical Management of Chronic Issues (C/o worsening cough and worsening SOB. Started about 1 yr ago. Pt accompanied by wife, [name] Young. )   Known chronic R pleural effusion, as well as HFrEF has seen cardiology Dr End and pulmonology Dr Wynona Neat. H/o CAD s/p 3v CABG. Last thoracentesis was 07/2023 - transudative effusion. Last saw cardiology 09/17/2023 at that time CXR showed redevelopment of R sided pleural effusion, labs showed BNP 103, Hgb 12 (stable), Cr 1.23 with eGFR 59. Lasix was increased to 40mg  daily. Rpt BMP stable. Considering R and L heart catheterization. Next pulm appt 11/05/2023.   Over the past few days dyspnea even at rest has gotten progressively worse, fatigue worsening. He doesn't note significant change in urine output with increased lasix dose.  No fevers/chills, chest pain, abd pain, headache, dizziness, no significant new leg swelling or weight gain.  No RUQ abd pain.   Hospitalization 12/2022 for liver abscess with E coli bacteremia with recurrent R pleural effusion s/p drainage treated with IV ceftriaxone and oral flagyl and liver drain placement.  Saw [facility] ID Dr Rivka Safer in f/u for hepatic abscess, liver drain removed 01/23/2023 and PICC line removed 02/01/2023. F/u PRN.      Relevant past medical, surgical, family and social history reviewed and updated as indicated. Interim medical history since our last visit reviewed. Allergies and medications reviewed and updated. Outpatient Medications Prior to Visit  Medication Sig Dispense Refill    acetaminophen (TYLENOL) 325 MG tablet Take 1 tablet (325 mg total) by mouth at bedtime as needed (sleep aid).     Cholecalciferol (VITAMIN D3) 25 MCG (1000 UT) CAPS Take 1 capsule (1,000 Units total) by mouth daily. 30 capsule    Cyanocobalamin (B-12) 1000 MCG CAPS Take 1 capsule by mouth every Monday, Wednesday, and Friday.     ELIQUIS 5 MG TABS tablet TAKE 1 TABLET BY MOUTH TWICE A DAY 60 tablet 5   feeding supplement (ENSURE ENLIVE / ENSURE PLUS) LIQD Take 237 mLs by mouth 2 (two) times daily between meals. 14220 mL 0   furosemide (LASIX) 40 MG tablet Take 1 tablet (40 mg total) by mouth daily. 30 tablet 11   glucose blood (TRUE METRIX BLOOD GLUCOSE TEST) test strip Use as instructed to check blood sugar once a day 100 each 3   Magnesium Citrate 125 MG CAPS Take 250 mg by mouth daily.     metoprolol succinate (TOPROL-XL) 25 MG 24 hr tablet Take 0.5 tablets (12.5 mg total) by mouth daily. Take with or immediately following a meal. 45 tablet 4   mirtazapine (REMERON) 15 MG tablet Take 1 tablet (15 mg total) by mouth at bedtime. 90 tablet 4   Multiple Vitamins-Minerals (MACULAR VITAMIN BENEFIT PO) Take 1 tablet by mouth daily.     nitroGLYCERIN (NITROSTAT) 0.4 MG SL tablet Place 1 tablet (0.4 mg total) under the tongue every 5 (five) minutes as needed for chest pain. 25 tablet  4   rosuvastatin (CRESTOR) 10 MG tablet TAKE 1 TABLET BY MOUTH EVERY DAY 90 tablet 0   tamsulosin (FLOMAX) 0.4 MG CAPS capsule Take 1 capsule (0.4 mg total) by mouth daily. 90 capsule 4   No facility-administered medications prior to visit.     Per HPI unless specifically indicated in ROS section below Review of Systems  Objective:  BP 134/64   Pulse (!) 41   Temp 97.7 F (36.5 C) (Oral)   Ht 5\' 10"  (1.778 m)   Wt 245 lb 8 oz (111.4 kg)   SpO2 96%   BMI 35.23 kg/m   Wt Readings from Last 3 Encounters:  10/08/23 245 lb 8 oz (111.4 kg)  09/17/23 249 lb 3.2 oz (113 kg)  08/02/23 249 lb 6.4 oz (113.1 kg)       Physical Exam Vitals and nursing note reviewed.  Constitutional:      Appearance: Normal appearance. He is not ill-appearing.  Cardiovascular:     Rate and Rhythm: Regular rhythm. Bradycardia present.     Pulses: Normal pulses.     Heart sounds: Murmur (3/6 systolic) heard.  Pulmonary:     Breath sounds: No wheezing, rhonchi or rales.     Comments:  Short winded at rest  Diminished breath sounds to RLL Neurological:     Mental Status: He is alert.       Results for orders placed or performed in visit on 10/02/23  Basic metabolic panel   Collection Time: 10/03/23 11:24 AM  Result Value Ref Range   Glucose 228 (H) 70 - 99 mg/dL   BUN 30 (H) 8 - 27 mg/dL   Creatinine, Ser 1.61 0.76 - 1.27 mg/dL   eGFR 58 (L) >09 UE/AVW/0.98   BUN/Creatinine Ratio 24 10 - 24   Sodium 141 134 - 144 mmol/L   Potassium 4.7 3.5 - 5.2 mmol/L   Chloride 101 96 - 106 mmol/L   CO2 25 20 - 29 mmol/L   Calcium 8.9 8.6 - 10.2 mg/dL   EKG - sinus arrhythmia ~70s with frequent ectopic ventricular beats, LAD, poor R wave progression  Assessment & Plan:  I spent 45 minutes caring for this patient today face to face, reviewing labs, prior records from another facility, performing a medically appropriate examination and/or evaluation, counseling and educating the patient on pleural effusion and treatment, documenting in the record and arranging for thoracentesis for Tuesday 2pm.  Problem List Items Addressed This Visit     Coronary artery disease   S/P CABG x 3   Paroxysmal atrial fibrillation (HCC)   He continues Eliquis and Toprol XL      Recurrent pleural effusion on right - Primary   Recurrent pleural effusion initially para-pneumonic during liver abscess 12/2022 however that is largely resolved without ongoing fever or RUQ abdominal discomfort. Now ?CHF related - cardiology and pulmonology involved.  Update CXR today.  Given worsening symptomatic with dyspnea and fatigue will order repeat  thoracentesis through [facility] department. I spoke with scheduler and have scheduled pt for Tuesday at 2pm. Will send off culture,gram stain, cell counts and cytology.  I have asked pt/wife to try and schedule sooner appointment with cardiology and pulmonology for follow up after this.       Relevant Orders   DG Chest 2 View   Moderate aortic stenosis   Chronic HFrEF (heart failure with reduced ejection fraction) (HCC)   Frequent PVCs   Bradycardia   EKG today overall stable - falsely  low heart rate due to frequent ectopy.  Continue low dose Toprol XL 12.5mg  daily      Relevant Orders   EKG 12-Lead   General weakness   Multifactorial. Complete thoracentesis and re evaluate.       Other Visit Diagnoses       Dyspnea, unspecified type       Relevant Orders   DG Chest 2 View        No orders of the defined types were placed in this encounter.   Orders Placed This Encounter  Procedures   DG Chest 2 View    Standing Status:   Future    Number of Occurrences:   1    Expiration Date:   10/07/2024    Reason for Exam (SYMPTOM  OR DIAGNOSIS REQUIRED):   follow up R pleural effusion    Preferred imaging location?:   [facility]   EKG 12-Lead    Patient Instructions  Chest xray today  I will try to set you up for thoracentesis (remove fluid out of right lung) in [place]. We will contact you for appointment. In the meantime try to schedule sooner follow up with Dr Wynona Neat.   Follow up plan: Return if symptoms worsen or fail to improve.  [name], MD

## 2023-10-09 ENCOUNTER — Ambulatory Visit
Admission: RE | Admit: 2023-10-09 | Discharge: 2023-10-09 | Discharge status: Home / Self Care | Payer: Medicare PPO | Source: Ambulatory Visit | Attending: Family Medicine | Admitting: Family Medicine

## 2023-10-09 ENCOUNTER — Ambulatory Visit
Admission: RE | Admit: 2023-10-09 | Discharge: 2023-10-09 | Disposition: A | Discharge status: Home / Self Care | Payer: Medicare PPO | Source: Ambulatory Visit | Attending: Radiology | Admitting: Radiology

## 2023-10-09 ENCOUNTER — Encounter: Payer: Self-pay | Admitting: Family Medicine

## 2023-10-09 ENCOUNTER — Other Ambulatory Visit: Payer: Self-pay | Admitting: Radiology

## 2023-10-09 DIAGNOSIS — Z951 Presence of aortocoronary bypass graft: Secondary | ICD-10-CM | POA: Diagnosis not present

## 2023-10-09 DIAGNOSIS — J90 Pleural effusion, not elsewhere classified: Secondary | ICD-10-CM

## 2023-10-09 DIAGNOSIS — R091 Pleurisy: Secondary | ICD-10-CM | POA: Diagnosis not present

## 2023-10-09 DIAGNOSIS — Z48813 Encounter for surgical aftercare following surgery on the respiratory system: Secondary | ICD-10-CM | POA: Diagnosis not present

## 2023-10-09 DIAGNOSIS — Z9889 Other specified postprocedural states: Secondary | ICD-10-CM | POA: Diagnosis not present

## 2023-10-09 DIAGNOSIS — R918 Other nonspecific abnormal finding of lung field: Secondary | ICD-10-CM | POA: Diagnosis not present

## 2023-10-09 DIAGNOSIS — I2510 Atherosclerotic heart disease of native coronary artery without angina pectoris: Secondary | ICD-10-CM | POA: Insufficient documentation

## 2023-10-09 DIAGNOSIS — I509 Heart failure, unspecified: Secondary | ICD-10-CM | POA: Diagnosis not present

## 2023-10-09 LAB — BODY FLUID CELL COUNT WITH DIFFERENTIAL
Lymphs, Fluid: 31 %
Monocyte-Macrophage-Serous Fluid: 50 %
Neutrophil Count, Fluid: 19 %
Total Nucleated Cell Count, Fluid: 218 uL

## 2023-10-09 MED ORDER — LIDOCAINE HCL (PF) 1 % IJ SOLN
10.0000 mL | Freq: Once | INTRAMUSCULAR | Status: AC
  Administered 2023-10-09: 10 mL via INTRADERMAL
  Filled 2023-10-09: qty 10

## 2023-10-09 NOTE — Assessment & Plan Note (Signed)
Multifactorial. Complete thoracentesis and re evaluate.

## 2023-10-09 NOTE — Procedures (Signed)
Ultrasound-guided diagnostic and therapeutic right sided thoracentesis performed yielding 1.4 liters of straw colored fluid. No immediate complications.   Diagnostic fluid was sent to the lab for further analysis. Follow-up chest x-ray pending. EBL is < 2 ml.

## 2023-10-09 NOTE — Assessment & Plan Note (Addendum)
EKG today overall stable - falsely low heart rate due to frequent ectopy.  Continue low dose Toprol XL 12.5mg  daily

## 2023-10-09 NOTE — Assessment & Plan Note (Signed)
He continues Eliquis and Toprol XL

## 2023-10-09 NOTE — Assessment & Plan Note (Addendum)
Recurrent pleural effusion initially para-pneumonic during liver abscess 12/2022 however that is largely resolved without ongoing fever or RUQ abdominal discomfort. Now ?CHF related - cardiology and pulmonology involved.  Update CXR today.  Given worsening symptomatic with dyspnea and fatigue will order repeat thoracentesis through [facility] IR department. I spoke with scheduler and have scheduled pt for Tuesday at 2pm. Will send off culture,gram stain, cell counts and cytology.  I have asked pt/wife to try and schedule sooner appointment with cardiology and pulmonology for follow up after this.

## 2023-10-10 ENCOUNTER — Encounter: Payer: Self-pay | Admitting: Family Medicine

## 2023-10-10 LAB — CYTOLOGY - NON PAP

## 2023-10-13 LAB — BODY FLUID CULTURE W GRAM STAIN: Culture: NO GROWTH

## 2023-10-17 ENCOUNTER — Telehealth: Payer: Self-pay | Admitting: Pulmonary Disease

## 2023-10-17 NOTE — Telephone Encounter (Addendum)
 I have no open appointments  I can place a request for a chest x-ray which he can stop by to have and we can try and drain the fluid just like we did recently if there is fluid buildup

## 2023-10-17 NOTE — Telephone Encounter (Signed)
 Spoke to patient's spouse, Catherine(DPR). She would like to have CXR at [facility].  Dr. Deanna Expose, please order. Thank you!

## 2023-10-17 NOTE — Telephone Encounter (Signed)
 Spouse called to move appointment on 2/24 with AO up. States his PCP wanted him in sooner as they believe there is more fluid on his lungs. Advised that AO and the Apps are booked. Please advise if patient can be double booked to work in sooner or if he is fine to wait.

## 2023-10-17 NOTE — Telephone Encounter (Signed)
 Pt wants his app moved up from 02-24 with AO. Pt thinks his fluid is building back up and PCP suggested he been seen sooner than later. Please advise what to do.

## 2023-10-18 NOTE — Telephone Encounter (Signed)
 NFN

## 2023-10-19 ENCOUNTER — Other Ambulatory Visit: Payer: Self-pay | Admitting: Pulmonary Disease

## 2023-10-19 ENCOUNTER — Ambulatory Visit
Admission: RE | Admit: 2023-10-19 | Discharge: 2023-10-19 | Disposition: A | Discharge status: Home / Self Care | Payer: Medicare PPO | Source: Ambulatory Visit | Attending: Pulmonary Disease | Admitting: Pulmonary Disease

## 2023-10-19 ENCOUNTER — Encounter: Payer: Self-pay | Admitting: Pulmonary Disease

## 2023-10-19 DIAGNOSIS — R0602 Shortness of breath: Secondary | ICD-10-CM | POA: Insufficient documentation

## 2023-10-19 DIAGNOSIS — J90 Pleural effusion, not elsewhere classified: Secondary | ICD-10-CM | POA: Diagnosis not present

## 2023-10-19 DIAGNOSIS — R918 Other nonspecific abnormal finding of lung field: Secondary | ICD-10-CM | POA: Diagnosis not present

## 2023-10-19 DIAGNOSIS — J9811 Atelectasis: Secondary | ICD-10-CM | POA: Diagnosis not present

## 2023-10-22 ENCOUNTER — Other Ambulatory Visit: Payer: Self-pay | Admitting: Pulmonary Disease

## 2023-10-22 ENCOUNTER — Encounter: Payer: Self-pay | Admitting: Pulmonary Disease

## 2023-10-22 DIAGNOSIS — J90 Pleural effusion, not elsewhere classified: Secondary | ICD-10-CM

## 2023-10-22 NOTE — Telephone Encounter (Signed)
 Pt had cxr at [facility] 10/19/23  I explained to him that the final report may take a couple of weeks with radiology backlog  Dr Deanna Expose- can you look at the images and advise if you think it looks ok?

## 2023-10-26 ENCOUNTER — Ambulatory Visit
Admission: RE | Admit: 2023-10-26 | Discharge: 2023-10-26 | Disposition: A | Discharge status: Home / Self Care | Payer: Medicare PPO | Source: Ambulatory Visit | Attending: Student | Admitting: Student

## 2023-10-26 ENCOUNTER — Ambulatory Visit
Admission: RE | Admit: 2023-10-26 | Discharge: 2023-10-26 | Disposition: A | Discharge status: Home / Self Care | Payer: Medicare PPO | Source: Ambulatory Visit | Attending: Pulmonary Disease | Admitting: Pulmonary Disease

## 2023-10-26 ENCOUNTER — Other Ambulatory Visit: Payer: Self-pay | Admitting: Student

## 2023-10-26 DIAGNOSIS — Z48813 Encounter for surgical aftercare following surgery on the respiratory system: Secondary | ICD-10-CM | POA: Diagnosis not present

## 2023-10-26 DIAGNOSIS — Z951 Presence of aortocoronary bypass graft: Secondary | ICD-10-CM | POA: Insufficient documentation

## 2023-10-26 DIAGNOSIS — I2510 Atherosclerotic heart disease of native coronary artery without angina pectoris: Secondary | ICD-10-CM | POA: Insufficient documentation

## 2023-10-26 DIAGNOSIS — J90 Pleural effusion, not elsewhere classified: Secondary | ICD-10-CM | POA: Insufficient documentation

## 2023-10-26 DIAGNOSIS — Z9889 Other specified postprocedural states: Secondary | ICD-10-CM

## 2023-10-26 DIAGNOSIS — I700 Atherosclerosis of aorta: Secondary | ICD-10-CM | POA: Insufficient documentation

## 2023-10-26 MED ORDER — LIDOCAINE HCL (PF) 1 % IJ SOLN
10.0000 mL | Freq: Once | INTRAMUSCULAR | Status: AC
  Administered 2023-10-26: 10 mL via INTRADERMAL
  Filled 2023-10-26: qty 10

## 2023-10-26 NOTE — Procedures (Signed)
PROCEDURE SUMMARY:  Successful US guided right thoracentesis. Yielded 1.5 L of clear yellow fluid. Pt tolerated procedure well. No immediate complications.  Specimen not sent for labs. CXR ordered; no post-procedure pneumothorax identified.   EBL < 2 mL  Mickie Kay, NP 10/26/2023 3:31 PM

## 2023-11-05 ENCOUNTER — Encounter: Payer: Self-pay | Admitting: Pulmonary Disease

## 2023-11-05 ENCOUNTER — Ambulatory Visit: Payer: Medicare PPO

## 2023-11-05 ENCOUNTER — Ambulatory Visit: Payer: Medicare PPO | Admitting: Pulmonary Disease

## 2023-11-05 VITALS — BP 129/57 | HR 65 | Temp 97.4°F | Ht 70.0 in | Wt 245.4 lb

## 2023-11-05 DIAGNOSIS — J9811 Atelectasis: Secondary | ICD-10-CM | POA: Diagnosis not present

## 2023-11-05 DIAGNOSIS — J90 Pleural effusion, not elsewhere classified: Secondary | ICD-10-CM

## 2023-11-05 DIAGNOSIS — R0602 Shortness of breath: Secondary | ICD-10-CM | POA: Diagnosis not present

## 2023-11-05 MED ORDER — METOLAZONE 2.5 MG PO TABS: 2.5000 mg | ORAL_TABLET | Freq: Every day | ORAL | 2 refills | Status: DC

## 2023-11-05 MED ORDER — DOXYCYCLINE HYCLATE 100 MG PO TABS: 100.0000 mg | ORAL_TABLET | Freq: Two times a day (BID) | ORAL | 0 refills | Status: DC

## 2023-11-05 NOTE — Progress Notes (Signed)
 [name] Young    [id number]    Jan 24, 1941  Primary Care Physician:Gutierrez, Wynona Canes, MD  Referring Physician: [name], MD [address] [place],  [place] [address]  Chief complaint:   Follow-up for shortness of breath  HPI:  Recently had thoracentesis This is his 6th thoracentesis  He claims he is not feeling very short of breath at the present time but he does get quite short of breath when the fluid builds up He feels there is always fluid around his lungs  He is compliant with Lasix at 41  Recently saw cardiology and there is discussions ongoing about cardiac catheterization which I think patient will benefit from  Fluids have been transudative  Echocardiogram with diastolic dysfunction No fever, no chills Denies any chest pains or chest discomfort  Remote history of smoking cigars here and there, was never really a smoker  No significant occupational exposure worked outdoors, worked for [employer] for many years  History of hypertension, diabetes, hypercholesterolemia, coronary artery disease for which he had bypass in 2018  He does have bilateral leg swelling  Outpatient Encounter Medications as of 11/05/2023  Medication Sig   acetaminophen (TYLENOL) 325 MG tablet Take 1 tablet (325 mg total) by mouth at bedtime as needed (sleep aid).   Cholecalciferol (VITAMIN D3) 25 MCG (1000 UT) CAPS Take 1 capsule (1,000 Units total) by mouth daily.   Cyanocobalamin (B-12) 1000 MCG CAPS Take 1 capsule by mouth every Monday, Wednesday, and Friday.   doxycycline (VIBRA-TABS) 100 MG tablet Take 1 tablet (100 mg total) by mouth 2 (two) times daily.   ELIQUIS 5 MG TABS tablet TAKE 1 TABLET BY MOUTH TWICE A DAY   feeding supplement (ENSURE ENLIVE / ENSURE PLUS) LIQD Take 237 mLs by mouth 2 (two) times daily between meals.   furosemide (LASIX) 40 MG tablet Take 1 tablet (40 mg total) by mouth daily.   glucose blood (TRUE METRIX BLOOD GLUCOSE TEST) test  strip Use as instructed to check blood sugar once a day   Magnesium Citrate 125 MG CAPS Take 250 mg by mouth daily.   metolazone (ZAROXOLYN) 2.5 MG tablet Take 1 tablet (2.5 mg total) by mouth daily.   metoprolol succinate (TOPROL-XL) 25 MG 24 hr tablet Take 0.5 tablets (12.5 mg total) by mouth daily. Take with or immediately following a meal.   mirtazapine (REMERON) 15 MG tablet Take 1 tablet (15 mg total) by mouth at bedtime.   Multiple Vitamins-Minerals (MACULAR VITAMIN BENEFIT PO) Take 1 tablet by mouth daily.   nitroGLYCERIN (NITROSTAT) 0.4 MG SL tablet Place 1 tablet (0.4 mg total) under the tongue every 5 (five) minutes as needed for chest pain.   rosuvastatin (CRESTOR) 10 MG tablet TAKE 1 TABLET BY MOUTH EVERY DAY   tamsulosin (FLOMAX) 0.4 MG CAPS capsule Take 1 capsule (0.4 mg total) by mouth daily.   No facility-administered encounter medications on file as of 11/05/2023.    Allergies as of 11/05/2023   (No Known Allergies)    Past Medical History:  Diagnosis Date   Adenomatous colon polyp    Aortic atherosclerosis (HCC)    Arthritis    Ascending aorta dilatation (HCC)    a.) TTE 11/23/2017: asc Ao measured 37 mm; b.) TTE 10/23/2021: Ao root measured 41 mm, asc Ao measured 38 mm; c.) TEE 10/28/2021: asc Ao measured 38 mm; d.) TTE 01/11/2022: Ao root 40 mm, asc Ao 39 mm   Ascending cholangitis  10/2021   BPH (benign prostatic hyperplasia)    Cardiomyopathy (HCC)    CKD (chronic kidney disease), stage III (HCC)    Claustrophobia    Coronary artery disease    a.) LHC 12/21/2017: 75% mLAD, 90% D1, 80-99% OM1/2, CTO pRCA (L-R collaterals) --> CVTS consult. 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   In setting of bacteremia from ascending colangitis   GERD (gastroesophageal reflux disease)    HFrEF (heart failure with reduced ejection fraction) (HCC)    a.) TTE 11/23/2017: EF 50-55%, mod MAC, triv TR, G1DD; b.) TTE 10/23/2021:  EF 40-45%, mild LAE, Ao sclerosis, triv MR, G2DD; c.) TEE 10/28/2021: EF 40-45%, glob HK, mobile vegitation on MV; d.) TTE 01/11/2022: EF 50-55%, mild LVH, RVE, Ao sclerosis, mild MR/AR, G1DD   History of cholelithiasis    History of kidney stones    ca ox Vonita Moss @ [facility]) now [employer] [facility]   History of pneumonia    HLD (hyperlipidemia)    HTN (hypertension)    Jaundice    age 19   Long term current use of anticoagulant    a.) apixaban   Paroxysmal atrial fibrillation (HCC)    a.) CHA2DS2VASc = 6 (age x 2, HFrEF, HTN, vascular disease history, T2DM);  b.) rate/rhythm maintained on oral metoprolol succinate; chronically anticoagulated with apixaban   Pneumonia    Right-sided carotid artery disease (HCC)    a.) carotid doppler 04/05/2022: 1-39% RICA   S/P CABG x 3    a.) LIMA-LAD, SVG-diagonal, SVG-PL branch of RCA   S/P cataract extraction and insertion of intraocular lens    T2DM (type 2 diabetes mellitus) (HCC) 2010    Past Surgical History:  Procedure Laterality Date   CARPAL TUNNEL RELEASE Bilateral    CATARACT EXTRACTION, BILATERAL     COLONOSCOPY  11/2012   11 adenomatous polyps, diverticulosis, rec rpt 1 yr [name])   COLONOSCOPY  12/2013   3 polyps, diverticulosis, rec rpt 3 yrs [name])   COLONOSCOPY  06/2019   6 polyps (TA), diverticulosis, f/u left open ended [name])   CORONARY ARTERY BYPASS GRAFT N/A 01/18/2018   Procedure: CORONARY ARTERY BYPASS GRAFTING (CABG) x 3; Using Left Internal Mammary Artery, and Right Greater Saphenous Vein harvested Endoscopically, Coronary Artery Endarterectomy;  Surgeon: Kerin Perna, MD;  Location: [facility] OR;  Service: Open Heart Surgery;  Laterality: N/A;   ERCP N/A 10/23/2021   Procedure: ENDOSCOPIC RETROGRADE CHOLANGIOPANCREATOGRAPHY (ERCP);  Surgeon: Meryl Dare, MD;  Location: [facility] ENDOSCOPY;  Service: Endoscopy;  Laterality: N/A;   IR CATHETER TUBE CHANGE  01/08/2023   IR RADIOLOGIST EVAL & MGMT  01/23/2023   IR THORACENTESIS ASP  PLEURAL SPACE W/IMG GUIDE  12/29/2022   KNEE CARTILAGE SURGERY Left    LEFT HEART CATH AND CORONARY ANGIOGRAPHY N/A 12/21/2017   Procedure: LEFT HEART CATH AND CORONARY ANGIOGRAPHY;  Surgeon: Yvonne Kendall, MD;  Location: [facility] INVASIVE CV LAB;  Service: Cardiovascular;  Laterality: N/A;   LITHOTRIPSY     REMOVAL OF STONES  10/23/2021   Procedure: REMOVAL OF STONES;  Surgeon: Meryl Dare, MD;  Location: [facility] ENDOSCOPY;  Service: Endoscopy;;   SPHINCTEROTOMY  10/23/2021   Procedure: Dennison Mascot;  Surgeon: Meryl Dare, MD;  Location: [facility] ENDOSCOPY;  Service: Endoscopy;;   TEE WITHOUT CARDIOVERSION N/A 01/18/2018   Procedure: TRANSESOPHAGEAL ECHOCARDIOGRAM (TEE);  Surgeon: Donata Clay, Theron Arista, MD;  Location: [facility] OR;  Service: Open Heart Surgery;  Laterality:  N/A;   TEE WITHOUT CARDIOVERSION N/A 10/28/2021   Procedure: TRANSESOPHAGEAL ECHOCARDIOGRAM (TEE);  Surgeon: Chrystie Nose, MD;  Location: [facility] ENDOSCOPY;  Service: Cardiovascular;  Laterality: N/A;   UMBILICAL HERNIA REPAIR     with mesh    Family History  Problem Relation Age of Onset   Alzheimer's disease Mother    Breast cancer Mother        breast   Atrial fibrillation Mother    CAD Mother    Stroke Father 57   Diabetes Father    CAD Father    Colon polyps Sister    Colon polyps Brother    Rectal cancer Maternal Grandfather        rectal   Colon cancer Maternal Grandfather 88   Esophageal cancer Neg Hx    Stomach cancer Neg Hx     Social History   Socioeconomic History   Marital status: Married    Spouse name: Not on file   Number of children: Not on file   Years of education: Not on file   Highest education level: 12th grade  Occupational History   Not on file  Tobacco Use   Smoking status: Never    Passive exposure: Past   Smokeless tobacco: Former    Types: Chew    Quit date: 09/08/2001  Vaping Use   Vaping status: Never Used  Substance and Sexual Activity   Alcohol use: Not Currently    Comment: occ    Drug use: No   Sexual activity: Not Currently  Other Topics Concern   Not on file  Social History Narrative   Caffeine: occasional Lives with wife, grandson [name] 2009)Occupation: retired, 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.Diet: some water, fruits/vegetables daily, avoids potatoes   Social Drivers of Health   Financial Resource Strain: Low Risk  (10/08/2023)   Overall Financial Resource Strain (CARDIA)    Difficulty of Paying Living Expenses: Not hard at all  Food Insecurity: No Food Insecurity (10/08/2023)   Hunger Vital Sign    Worried About Running Out of Food in the Last Year: Never true    Ran Out of Food in the Last Year: Never true  Transportation Needs: No Transportation Needs (10/08/2023)   PRAPARE - Administrator, Civil Service (Medical): No    Lack of Transportation (Non-Medical): No  Physical Activity: Unknown (10/08/2023)   Exercise Vital Sign    Days of Exercise per Week: 0 days    Minutes of Exercise per Session: Not on file  Stress: Patient Declined (10/08/2023)   [employer] of Occupational Health - Occupational Stress Questionnaire    Feeling of Stress : Patient declined  Social Connections: Unknown (10/08/2023)   Social Connection and Isolation Panel [NHANES]    Frequency of Communication with Friends and Family: Twice a week    Frequency of Social Gatherings with Friends and Family: Patient declined    Attends Religious Services: More than 4 times per year    Active Member of [employer] or Organizations: Yes    Attends Engineer, structural: More than 4 times per year    Marital Status: Married  Catering manager Violence: Not on file    Review of Systems  Constitutional:  Positive for fatigue.  Respiratory:  Positive for cough and shortness of breath.     Vitals:   11/05/23 1034  BP: (!) 129/57  Pulse: 65  Temp: (!) 97.4 F (36.3 C)  SpO2: 96%  Physical Exam Constitutional:      Appearance: He  is obese.  HENT:     Head: Normocephalic.     Mouth/Throat:     Mouth: Mucous membranes are moist.  Eyes:     General: No scleral icterus. Cardiovascular:     Rate and Rhythm: Normal rate and regular rhythm.     Heart sounds: No murmur heard.    No friction rub.  Pulmonary:     Effort: No respiratory distress.     Breath sounds: No stridor. No wheezing or rhonchi.     Comments: Decreased air entry right base Musculoskeletal:     Cervical back: No rigidity or tenderness.  Neurological:     Mental Status: He is alert.  Psychiatric:        Mood and Affect: Mood normal.    Data Reviewed: PFT from 2019 with no obstruction, no restriction, normal diffusing capacity  CT scan from August 2023, CT of the abdomen did show a right pleural effusion  Recent chest x-ray 10/11/2022 with right pleural effusion, prominent bronchovascular markings-reviewed with the patient-diastolic failure  PFT reviewed showing a suboptimal study, mild obstruction, lung capacity could not be performed -Stable compared to one performed in 2019 Chest x-ray postthoracentesis reviewed showing resolution of pleural effusion  Fluid analysis has been transudative, cytology negative x 3  Chest x-ray performed in the office today does show reaccumulation of the right effusion  Assessment:  Chronic cough with mucus production -Clear phlegm -Not complain of any symptoms suggesting an infection at present  Bronchitis -Treated  Recurrent pleural effusion -Last thoracentesis was 10/26/2023, prior to that was 10/09/2023 -Has had 6 thoracentesis in total -Likely related to decompensated diastolic heart failure  Diastolic heart failure -Continue to follow-up with cardiology -He is compliant with diuretics  History of chronic heart failure-diastolic heart failure -Recently had his diuretics increased and compliant   Plan/Recommendations: Will add metolazone 2.5  Encouraged to make sure he follows up with  cardiology for possible cardiac catheterization  With concern for respiratory infection, a course of doxycycline will be called in -I did encourage him that if he does not feel like he is developing an infection, he can have the antibiotic in hand and not started yet  Follow-up in about 3 months  Will benefit from cardiac catheterization and he has an appointment to see cardiology on 6 March  Encouraged to call with any significant concerns  Will place a request for thoracentesis, you should consider a repeat thoracentesis if his shortness of breath gets worse  An option of the Pleurx catheter was also discussed if we can get his fluid reaccumulation controlled otherwise   Virl Diamond MD [facility] 11/05/2023, 1:03 PM  CC: [name], MD

## 2023-11-05 NOTE — Patient Instructions (Signed)
 I did prescribe metolazone -This is in addition to the Lasix to get rid of water  Follow-up with cardiology -I think it is important to go ahead with a cardiac catheterization  Your x-ray today does show some fluid buildup -Will place an order for thoracentesis  Follow-up in about 2 months  I am placing a prescription for doxycycline to have in hand

## 2023-11-10 DIAGNOSIS — J942 Hemothorax: Secondary | ICD-10-CM

## 2023-11-10 HISTORY — DX: Hemothorax: J94.2

## 2023-11-13 ENCOUNTER — Ambulatory Visit: Payer: Medicare PPO | Admitting: Family Medicine

## 2023-11-13 ENCOUNTER — Encounter: Payer: Self-pay | Admitting: Family Medicine

## 2023-11-13 VITALS — BP 124/84 | HR 88 | Temp 97.7°F | Ht 70.0 in | Wt 239.2 lb

## 2023-11-13 DIAGNOSIS — I350 Nonrheumatic aortic (valve) stenosis: Secondary | ICD-10-CM | POA: Diagnosis not present

## 2023-11-13 DIAGNOSIS — E1122 Type 2 diabetes mellitus with diabetic chronic kidney disease: Secondary | ICD-10-CM

## 2023-11-13 DIAGNOSIS — I5032 Chronic diastolic (congestive) heart failure: Secondary | ICD-10-CM | POA: Diagnosis not present

## 2023-11-13 DIAGNOSIS — J90 Pleural effusion, not elsewhere classified: Secondary | ICD-10-CM

## 2023-11-13 DIAGNOSIS — E1169 Type 2 diabetes mellitus with other specified complication: Secondary | ICD-10-CM | POA: Diagnosis not present

## 2023-11-13 DIAGNOSIS — N183 Chronic kidney disease, stage 3 unspecified: Secondary | ICD-10-CM | POA: Diagnosis not present

## 2023-11-13 DIAGNOSIS — E43 Unspecified severe protein-calorie malnutrition: Secondary | ICD-10-CM

## 2023-11-13 DIAGNOSIS — R531 Weakness: Secondary | ICD-10-CM

## 2023-11-13 LAB — RENAL FUNCTION PANEL
Albumin: 3.1 g/dL — ABNORMAL LOW (ref 3.5–5.2)
BUN: 38 mg/dL — ABNORMAL HIGH (ref 6–23)
CO2: 34 meq/L — ABNORMAL HIGH (ref 19–32)
Calcium: 9 mg/dL (ref 8.4–10.5)
Chloride: 94 meq/L — ABNORMAL LOW (ref 96–112)
Creatinine, Ser: 1.09 mg/dL (ref 0.40–1.50)
GFR: 63.7 mL/min (ref >60.00)
Glucose, Bld: 223 mg/dL — ABNORMAL HIGH (ref 70–99)
Phosphorus: 3.9 mg/dL (ref 2.3–4.6)
Potassium: 4.1 meq/L (ref 3.5–5.1)
Sodium: 137 meq/L (ref 135–145)

## 2023-11-13 LAB — POCT GLYCOSYLATED HEMOGLOBIN (HGB A1C): Hemoglobin A1C: 6.9 % — AB (ref 4.0–5.6)

## 2023-11-13 NOTE — Assessment & Plan Note (Signed)
 Thought recurrent pleural effusion due to this. Upcoming cardiology appt to discuss heart catheterization for further evaluation.  Continue lasix 40mg  daily, with recent addition of metolazone. Update renal panel

## 2023-11-13 NOTE — Assessment & Plan Note (Deleted)
 Thought recurrent pleural effusion due to this. Upcoming cardiology appt to discuss heart catheterization for further evaluation.

## 2023-11-13 NOTE — Assessment & Plan Note (Signed)
 Chronic, stable period off medication - A1c in diet controlled diabetes.  Discussed candy and soda/juice intake.

## 2023-11-13 NOTE — Progress Notes (Signed)
 Ph: [phone] Fax: [phone]   Patient ID: [name] Merl., male    DOB: 1941/05/07, 83 y.o.   MRN: [id number]  This visit was conducted in person.  BP 124/84   Pulse 88   Temp 97.7 F (36.5 C) (Oral)   Ht 5\' 10"  (1.778 m)   Wt 239 lb 4 oz (108.5 kg)   SpO2 94%   BMI 34.33 kg/m    CC: follow up visit  Subjective:   HPI: [name] Tracz. is a 83 y.o. male presenting on 11/13/2023 for Medical Management of Chronic Issues (Here for 4 mo f/u. Pt accompanied by wife, [name] Young. )   See prior note for details.  Known chronic R pleural effusion, as well as HFrEF and CAD s/p 3v CABG, followed by cardiology Dr End and pulmonology Dr Wynona Neat.   Seen late 09/2023 with worsening exertional dyspnea in setting of recurrent R pleural effusion s/p thoracentesis 10/09/2023 (1.4L straw colored fluid) - negative cytology, negative for infection. Had another one done 10/26/2023. Saw pulm Dr Wynona Neat in f/u 11/05/2023, note reviewed. Thought recurrent effusions related to decompensated diastolic CHF.  He continues lasix 40mg  daily.  Pulm added metloazone 2.5mg  daily.  Has been recommended repeat heart catheterization for further evaluation.  Next cards appt is this Thursday.   Continues sleeping in recliner.   No appetite, notes dysgeusia for several weeks.  He drinks 1-2 ensure a day, some pepsi and juice.  He eats a piece of candy every day.  A1c trending up - but still in diet-controlled diabetes range.   Previous hospitalization 12/2022 for liver abscess with E coli bacteremia with recurrent R pleural effusion s/p drainage treated with IV ceftriaxone and oral flagyl and liver drain placement.  Saw [facility] ID Dr Rivka Safer in f/u for hepatic abscess, liver drain removed 01/23/2023 and PICC line removed 02/01/2023. F/u PRN.      Relevant past medical, surgical, family and social history reviewed and updated as indicated. Interim medical history since our last visit  reviewed. Allergies and medications reviewed and updated. Outpatient Medications Prior to Visit  Medication Sig Dispense Refill   acetaminophen (TYLENOL) 325 MG tablet Take 1 tablet (325 mg total) by mouth at bedtime as needed (sleep aid).     Cholecalciferol (VITAMIN D3) 25 MCG (1000 UT) CAPS Take 1 capsule (1,000 Units total) by mouth daily. 30 capsule    Cyanocobalamin (B-12) 1000 MCG CAPS Take 1 capsule by mouth every Monday, Wednesday, and Friday.     ELIQUIS 5 MG TABS tablet TAKE 1 TABLET BY MOUTH TWICE A DAY 60 tablet 5   feeding supplement (ENSURE ENLIVE / ENSURE PLUS) LIQD Take 237 mLs by mouth 2 (two) times daily between meals. 14220 mL 0   furosemide (LASIX) 40 MG tablet Take 1 tablet (40 mg total) by mouth daily. 30 tablet 11   glucose blood (TRUE METRIX BLOOD GLUCOSE TEST) test strip Use as instructed to check blood sugar once a day 100 each 3   Magnesium Citrate 125 MG CAPS Take 250 mg by mouth daily.     metolazone (ZAROXOLYN) 2.5 MG tablet Take 1 tablet (2.5 mg total) by mouth daily. 30 tablet 2   metoprolol succinate (TOPROL-XL) 25 MG 24 hr tablet Take 0.5 tablets (12.5 mg total) by mouth daily. Take with or immediately following a meal. 45 tablet 4   mirtazapine (REMERON) 15 MG tablet Take 1 tablet (15 mg total) by mouth at bedtime. 90 tablet 4  Multiple Vitamins-Minerals (MACULAR VITAMIN BENEFIT PO) Take 1 tablet by mouth daily.     nitroGLYCERIN (NITROSTAT) 0.4 MG SL tablet Place 1 tablet (0.4 mg total) under the tongue every 5 (five) minutes as needed for chest pain. 25 tablet 4   rosuvastatin (CRESTOR) 10 MG tablet TAKE 1 TABLET BY MOUTH EVERY DAY 90 tablet 0   tamsulosin (FLOMAX) 0.4 MG CAPS capsule Take 1 capsule (0.4 mg total) by mouth daily. 90 capsule 4   doxycycline (VIBRA-TABS) 100 MG tablet Take 1 tablet (100 mg total) by mouth 2 (two) times daily. (Patient not taking: Reported on 11/13/2023) 20 tablet 0   No facility-administered medications prior to visit.      Per HPI unless specifically indicated in ROS section below Review of Systems  Objective:  BP 124/84   Pulse 88   Temp 97.7 F (36.5 C) (Oral)   Ht 5\' 10"  (1.778 m)   Wt 239 lb 4 oz (108.5 kg)   SpO2 94%   BMI 34.33 kg/m   Wt Readings from Last 3 Encounters:  11/13/23 239 lb 4 oz (108.5 kg)  11/05/23 245 lb 6.4 oz (111.3 kg)  10/08/23 245 lb 8 oz (111.4 kg)      Physical Exam Vitals and nursing note reviewed.  Constitutional:      Appearance: Normal appearance. He is not ill-appearing.     Comments: Ambulates with cane  HENT:     Mouth/Throat:     Mouth: Mucous membranes are moist.     Pharynx: Oropharynx is clear. No oropharyngeal exudate or posterior oropharyngeal erythema.     Comments: No signs of thrush Eyes:     Extraocular Movements: Extraocular movements intact.     Pupils: Pupils are equal, round, and reactive to light.  Cardiovascular:     Rate and Rhythm: Normal rate and regular rhythm.     Pulses: Normal pulses.     Heart sounds: Murmur (3/6 systolic throughout) heard.  Pulmonary:     Effort: No respiratory distress.     Breath sounds: No wheezing, rhonchi or rales.     Comments:  Ambulation limited by dyspnea LLL crackles Diminished breath sounds to RLL Musculoskeletal:     Right lower leg: No edema.     Left lower leg: No edema.  Neurological:     Mental Status: He is alert.  Psychiatric:        Mood and Affect: Mood normal.        Behavior: Behavior normal.       Results for orders placed or performed in visit on 11/13/23  POCT glycosylated hemoglobin (Hb A1C)   Collection Time: 11/13/23 10:03 AM  Result Value Ref Range   Hemoglobin A1C 6.9 (A) 4.0 - 5.6 %   HbA1c POC (<> result, manual entry)     HbA1c, POC (prediabetic range)     HbA1c, POC (controlled diabetic range)     Lab Results  Component Value Date   NA 141 10/03/2023   CL 101 10/03/2023   K 4.7 10/03/2023   CO2 25 10/03/2023   BUN 30 (H) 10/03/2023   CREATININE 1.25  10/03/2023   EGFR 58 (L) 10/03/2023   CALCIUM 8.9 10/03/2023   PHOS 4.2 07/09/2023   ALBUMIN 3.6 (L) 09/17/2023   GLUCOSE 228 (H) 10/03/2023   Lab Results  Component Value Date   TSH 4.56 07/09/2023   T3TOTAL 91 12/26/2022   Assessment & Plan:   Problem List Items Addressed This Visit  Type 2 diabetes mellitus with other specified complication (HCC)   Chronic, stable period off medication - A1c in diet controlled diabetes.  Discussed candy and soda/juice intake.       Relevant Orders   POCT glycosylated hemoglobin (Hb A1C) (Completed)   CKD stage 3 due to type 2 diabetes mellitus (HCC)   Update renal panel with new daily metolazone use.       Relevant Orders   Renal function panel   Recurrent pleural effusion on right - Primary   Recurs s/p several thoracentesis (1/27, again 2/14) with quick re-accumulation as evidenced by latest CXR results 11/05/2023.  He has declined Pleur-X cath placement.  Pending rpt cardiology evaluation.       Protein-calorie malnutrition, severe (HCC)   He continues remeron and regular ensure intake      Moderate aortic stenosis   Known h/o this, again heard today.       Chronic diastolic CHF (congestive heart failure) (HCC)   Thought recurrent pleural effusion due to this. Upcoming cardiology appt to discuss heart catheterization for further evaluation.  Continue lasix 40mg  daily, with recent addition of metolazone. Update renal panel      General weakness     No orders of the defined types were placed in this encounter.   Orders Placed This Encounter  Procedures   Renal function panel   POCT glycosylated hemoglobin (Hb A1C)    Patient Instructions  Labs today Keep heart doctor appointment Thursday.  Schedule repeat thoracentesis when needed.  Follow up plan: Return in about 3 months (around 02/13/2024), or if symptoms worsen or fail to improve, for follow up visit.  [name], MD

## 2023-11-13 NOTE — Assessment & Plan Note (Addendum)
 Update renal panel with new daily metolazone use.

## 2023-11-13 NOTE — Assessment & Plan Note (Signed)
 Recurs s/p several thoracentesis (1/27, again 2/14) with quick re-accumulation as evidenced by latest CXR results 11/05/2023.  He has declined Pleur-X cath placement.  Pending rpt cardiology evaluation.

## 2023-11-13 NOTE — Assessment & Plan Note (Signed)
 He continues remeron and regular ensure intake

## 2023-11-13 NOTE — Patient Instructions (Signed)
 Labs today Keep heart doctor appointment Thursday.  Schedule repeat thoracentesis when needed.

## 2023-11-13 NOTE — Assessment & Plan Note (Signed)
 Known h/o this, again heard today.

## 2023-11-15 ENCOUNTER — Ambulatory Visit: Payer: Medicare PPO | Attending: Internal Medicine | Admitting: Internal Medicine

## 2023-11-15 ENCOUNTER — Encounter: Payer: Self-pay | Admitting: Internal Medicine

## 2023-11-15 VITALS — BP 124/65 | HR 91 | Ht 70.0 in | Wt 238.0 lb

## 2023-11-15 DIAGNOSIS — I5032 Chronic diastolic (congestive) heart failure: Secondary | ICD-10-CM | POA: Diagnosis not present

## 2023-11-15 DIAGNOSIS — I2510 Atherosclerotic heart disease of native coronary artery without angina pectoris: Secondary | ICD-10-CM | POA: Diagnosis not present

## 2023-11-15 DIAGNOSIS — Z8679 Personal history of other diseases of the circulatory system: Secondary | ICD-10-CM | POA: Diagnosis not present

## 2023-11-15 DIAGNOSIS — I480 Paroxysmal atrial fibrillation: Secondary | ICD-10-CM

## 2023-11-15 DIAGNOSIS — I350 Nonrheumatic aortic (valve) stenosis: Secondary | ICD-10-CM

## 2023-11-15 DIAGNOSIS — J90 Pleural effusion, not elsewhere classified: Secondary | ICD-10-CM

## 2023-11-15 DIAGNOSIS — I5022 Chronic systolic (congestive) heart failure: Secondary | ICD-10-CM

## 2023-11-15 NOTE — Patient Instructions (Addendum)
 Medication Instructions:  Your physician recommends that you continue on your current medications as directed. Please refer to the Current Medication list given to you today.   *If you need a refill on your cardiac medications before your next appointment, please call your pharmacy*   Lab Work: Your provider would like for you to have following labs drawn today (CBC).     Testing/Procedures: Your physician has requested that you have a cardiac catheterization. Cardiac catheterization is used to diagnose and/or treat various heart conditions. Doctors may recommend this procedure for a number of different reasons. The most common reason is to evaluate chest pain. Chest pain can be a symptom of coronary artery disease (CAD), and cardiac catheterization can show whether plaque is narrowing or blocking your heart's arteries. This procedure is also used to evaluate the valves, as well as measure the blood flow and oxygen levels in different parts of your heart. For further information please visit [email or web address] Please follow instruction sheet, as given. Please see instructions below   Follow-Up: At [facility], you and your health needs are our priority.  As part of our continuing mission to provide you with exceptional heart care, we have created designated Provider Care Teams.  These Care Teams include your primary Cardiologist (physician) and Advanced Practice Providers (APPs -  Physician Assistants and Nurse Practitioners) who all work together to provide you with the care you need, when you need it.  We recommend signing up for the patient portal called "MyChart".  Sign up information is provided on this After Visit Summary.  MyChart is used to connect with patients for Virtual Visits (Telemedicine).  Patients are able to view lab/test results, encounter notes, upcoming appointments, etc.  Non-urgent messages can be sent to your provider as well.   To learn more about what you can  do with MyChart, go to [email or web address].    Your next appointment:   3 week(s)  Provider:   You may see Yvonne Kendall, MD or one of the following Advanced Practice Providers on your designated Care Team:   Nicolasa Ducking, NP Eula Listen, PA-C Cadence Fransico Michael, PA-C Charlsie Quest, NP Carlos Levering, NP     [facility] A DEPT OF [place] AT [facility] [address] [place] [address] Dept: [phone] Loc: [phone]  [place] [place].  11/15/2023  You are scheduled for a Cardiac Catheterization on Tuesday, March 11 with Dr. [name] End.  1. Please arrive at the [facility] Center Entrance of [facility], [address], [place] [address] at 8:30 AM (This is 1 hour(s) prior to your procedure time).  Proceed to the Check-In Desk directly inside the entrance.  Procedure Parking: Use the entrance off of the [address] side of the hospital. Turn right upon entering and follow the driveway to parking that is directly in front of the Heart & Vascular Center. There is no valet parking available at this entrance, however there is an awning directly in front of the Heart & Vascular Center for drop off/ pick up for patients.  Special note: Every effort is made to have your procedure done on time. Please understand that emergencies sometimes delay scheduled procedures.  2. Diet: Do not eat solid foods after midnight.  The patient may have clear liquids until 5am upon the day of the procedure.  3. Labs: You will need to have blood drawn today (CBC)  4. Medication instructions in preparation  for your procedure:   Contrast Allergy: No  HOLD Eliquis 2 days prior to procedure Lasix the morning of procedure   START TAKING Aspirin 81 mg by mouth daily 2 days prior and the morning of procedure   On the morning of your procedure, take your Aspirin 81 mg and any morning medicines NOT  listed above.  You may use sips of water.  5. Plan to go home the same day, you will only stay overnight if medically necessary. 6. Bring a current list of your medications and current insurance cards. 7. You MUST have a responsible person to drive you home. 8. Someone MUST be with you the first 24 hours after you arrive home or your discharge will be delayed. 9. Please wear clothes that are easy to get on and off and wear slip-on shoes.  Thank you for allowing [place] to care for you!   -- [facility]

## 2023-11-15 NOTE — Progress Notes (Signed)
 Cardiology Office Note:  .   Date:  11/15/2023  ID:  [name] Merl., DOB 1941-03-26, MRN [id number] PCP: [name], MD  [facility] Providers Cardiologist:  Yvonne Kendall, MD     History of Present Illness: .   [name] Pustejovsky. is a 83 y.o. male with history of three-vessel CAD status post CABG (01/2018) complicated by postoperative atrial fibrillation (transient atrial fibrillation recurred in the setting of sepsis related to ascending cholangitis), ascending cholangitis complicated by recurrent atrial fibrillation, heart failure with recovered ejection fraction (LVEF 40-45% -> 50-55%), and mitral valve endocarditis, as well as hypertension, hyperlipidemia, type 2 diabetes mellitus, hepatic abscess, and chronic kidney disease stage III, who presents for follow-up of coronary artery disease, atrial fibrillation, and endocarditis.  I last saw him in January, at which time he complained of fatigue and exertional dyspnea with minimal activity.  This had improved following thoracentesis but seem to have recurred over the preceding week or 2.  He also noted some right sided abdominal pain a couple weeks earlier, though this had resolved on its own.  Chest radiograph showed increased size of small to moderate right pleural effusion and patchy airspace opacities in the right lower lung.  He has since undergone thoracentesis x 2.  His pulmonologist felt that recurrent pleural effusions were driven by decompensated HFpEF, prompting addition of metolazone to his regimen of furosemide 40 mg daily.  Today, [name] Young is frustrated by recurrent shortness of breath.  He says he began feeling short of breath again a day or 2 after his last thoracentesis in mid February.  He says it is hard for him to lie flat and do any sort of activity.  He denies chest pain, palpitations, lightheadedness, and edema.  He is urinating a lot notes that his weight is actually going down.  He would like to have  another thoracentesis and asks about having a more permanent solution, such as a Pleurx catheter, placed.  ROS: See HPI  Studies Reviewed: Marland Kitchen   EKG Interpretation Date/Time:  Thursday November 15 2023 09:28:57 EST Ventricular Rate:  91 PR Interval:  140 QRS Duration:  92 QT Interval:  368 QTC Calculation: 452 R Axis:   0  Text Interpretation: Sinus rhythm with frequent Premature ventricular complexes and premature atrial complexes Left ventricular hypertrophy with repolarization abnormality ( R in aVL ) Abnormal ECG When compared with ECG of 17-Sep-2023 13:46, No significant change was found Confirmed by Deklyn Trachtenberg, [name] [phone]) on 11/15/2023 2:26:04 PM    TEE (12/30/2022): Normal LV size and wall thickness.  LVEF 50-55% with normal wall motion and grade 1 diastolic dysfunction.  Normal RV size and function.  Normal biatrial size.  No pericardial effusion.  Degenerative mitral valve with moderate annular calcification.  Mild mitral regurgitation.  Mild tricuspid regurgitation.  Calcified aortic valve with mild to moderate stenosis (mean gradient 19 mmHg) and mild regurgitation.  Mildly dilated aortic root, measuring 4.0 cm.  Risk Assessment/Calculations:    CHA2DS2-VASc Score = 6   This indicates a 9.7% annual risk of stroke. The patient's score is based upon: CHF History: 1 HTN History: 1 Diabetes History: 1 Stroke History: 0 Vascular Disease History: 1 Age Score: 2 Gender Score: 0            Physical Exam:   VS:  BP 124/65 (BP Location: Left Arm, Patient Position: Sitting, Cuff Size: Large)   Pulse 91   Ht 5\' 10"  (1.778 m)   Wt 238  lb (108 kg)   SpO2 94%   BMI 34.15 kg/m    Wt Readings from Last 3 Encounters:  11/15/23 238 lb (108 kg)  11/13/23 239 lb 4 oz (108.5 kg)  11/05/23 245 lb 6.4 oz (111.3 kg)    General:  NAD. Neck: No JVD or HJR. Lungs: Absent breath sounds in the lower one third right lung.  Lungs otherwise clear. Heart: Regular rate and rhythm with frequent  extrasystoles.  2/6 systolic murmur present. Abdomen: Soft, nontender, nondistended. Extremities: No lower extremity edema.  ASSESSMENT AND PLAN: .    Recurrent right pleural effusion: He remains unclear to me as to why [name] Young is having recurrent pleural effusions.  He remains quite symptomatic today.  His wife will reach out to the radiology department to schedule ultrasound-guided thoracentesis already ordered by his pulmonologist to be done tomorrow or Monday.  We will then move forward with right and left heart catheterization to better understand his hemodynamics on Tuesday.  If his right and left heart filling pressures are normal, [name] Young will need to speak with his pulmonologist about Pleurx catheter placement versus pleurodesis.  Heart failure with recovered ejection fraction: [name] Young appears euvolemic on exam without edema or JVD.  His weight continues to trend down with escalation of his diuretic therapy.  Despite this, he has significant shortness of breath consistent with NYHA class IV symptoms in the setting of recurrent right pleural effusion.  I am not convinced that heart failure itself is driving this.  Given the clinical uncertainty, we will move forward with right and left heart catheterization and possible PCI on Tuesday.  Apixaban will need to be held for 2 days prior to the procedure with aspirin 81 mg daily started while off apixaban.  Given normal LVEF on last echo, I will defer rechallenging with GDMT at this time given hypotension in the past.  This will need to be readdressed after catheterization.  Coronary artery disease: No frank angina reported though question if some of [name] Young exertional dyspnea is related to coronary insufficiency.  We will plan for a left heart catheterization and coronary artery/graft angiography as part of catheterization next week.  Continue aggressive secondary prevention, including apixaban and lieu of aspirin and rosuvastatin 10  mg daily.  History of mitral valve endocarditis: No evidence of recurrence.  Continue clinical follow-up.  Aortic stenosis: Mild to moderate aortic stenosis and mild regurgitation noted on last echo in 12/2022.  This will be further evaluated on upcoming catheterization.  Paroxysmal atrial fibrillation: Continue apixaban (except to hold for cath as above).  EKG today with sinus rhythm, PAC's, and PVC's.    Informed Consent   Shared Decision Making/Informed Consent The risks [stroke (1 in 1000), death (1 in 1000), kidney failure [usually temporary] (1 in 500), bleeding (1 in 200), allergic reaction [possibly serious] (1 in 200)], benefits (diagnostic support and management of coronary artery disease) and alternatives of a cardiac catheterization were discussed in detail with [name] Young and he is willing to proceed.     Dispo: Return to clinic in 3 weeks.  Signed, Yvonne Kendall, MD

## 2023-11-15 NOTE — H&P (View-Only) (Signed)
 Cardiology Office Note:  .   Date:  11/15/2023  ID:  [name] Merl., DOB 1941-03-26, MRN [id number] PCP: [name] Boyden, MD  [facility] Providers Cardiologist:  [name] Kendall, MD     History of Present Illness: .   [name] Young. is a 83 y.o. male with history of three-vessel CAD status post CABG (01/2018) complicated by postoperative atrial fibrillation (transient atrial fibrillation recurred in the setting of sepsis related to ascending cholangitis), ascending cholangitis complicated by recurrent atrial fibrillation, heart failure with recovered ejection fraction (LVEF 40-45% -> 50-55%), and mitral valve endocarditis, as well as hypertension, hyperlipidemia, type 2 diabetes mellitus, hepatic abscess, and chronic kidney disease stage III, who presents for follow-up of coronary artery disease, atrial fibrillation, and endocarditis.  I last saw him in January, at which time he complained of fatigue and exertional dyspnea with minimal activity.  This had improved following thoracentesis but seem to have recurred over the preceding week or 2.  He also noted some right sided abdominal pain a couple weeks earlier, though this had resolved on its own.  Chest radiograph showed increased size of small to moderate right pleural effusion and patchy airspace opacities in the right lower lung.  He has since undergone thoracentesis x 2.  His pulmonologist felt that recurrent pleural effusions were driven by decompensated HFpEF, prompting addition of metolazone to his regimen of furosemide 40 mg daily.  Today, [name] Young is frustrated by recurrent shortness of breath.  He says he began feeling short of breath again a day or 2 after his last thoracentesis in mid February.  He says it is hard for him to lie flat and do any sort of activity.  He denies chest pain, palpitations, lightheadedness, and edema.  He is urinating a lot notes that his weight is actually going down.  He would like to have  another thoracentesis and asks about having a more permanent solution, such as a Pleurx catheter, placed.  ROS: See HPI  Studies Reviewed: Marland Kitchen   EKG Interpretation Date/Time:  Thursday November 15 2023 09:28:57 EST Ventricular Rate:  91 PR Interval:  140 QRS Duration:  92 QT Interval:  368 QTC Calculation: 452 R Axis:   0  Text Interpretation: Sinus rhythm with frequent Premature ventricular complexes and premature atrial complexes Left ventricular hypertrophy with repolarization abnormality ( R in aVL ) Abnormal ECG When compared with ECG of 17-Sep-2023 13:46, No significant change was found Confirmed by [name] Young, [name] Young [phone]) on 11/15/2023 2:26:04 PM    TEE (12/30/2022): Normal LV size and wall thickness.  LVEF 50-55% with normal wall motion and grade 1 diastolic dysfunction.  Normal RV size and function.  Normal biatrial size.  No pericardial effusion.  Degenerative mitral valve with moderate annular calcification.  Mild mitral regurgitation.  Mild tricuspid regurgitation.  Calcified aortic valve with mild to moderate stenosis (mean gradient 19 mmHg) and mild regurgitation.  Mildly dilated aortic root, measuring 4.0 cm.  Risk Assessment/Calculations:    CHA2DS2-VASc Score = 6   This indicates a 9.7% annual risk of stroke. The patient's score is based upon: CHF History: 1 HTN History: 1 Diabetes History: 1 Stroke History: 0 Vascular Disease History: 1 Age Score: 2 Gender Score: 0            Physical Exam:   VS:  BP 124/65 (BP Location: Left Arm, Patient Position: Sitting, Cuff Size: Large)   Pulse 91   Ht 5\' 10"  (1.778 m)   Wt 238  lb (108 kg)   SpO2 94%   BMI 34.15 kg/m    Wt Readings from Last 3 Encounters:  11/15/23 238 lb (108 kg)  11/13/23 239 lb 4 oz (108.5 kg)  11/05/23 245 lb 6.4 oz (111.3 kg)    General:  NAD. Neck: No JVD or HJR. Lungs: Absent breath sounds in the lower one third right lung.  Lungs otherwise clear. Heart: Regular rate and rhythm with frequent  extrasystoles.  2/6 systolic murmur present. Abdomen: Soft, nontender, nondistended. Extremities: No lower extremity edema.  ASSESSMENT AND PLAN: .    Recurrent right pleural effusion: He remains unclear to me as to why [name] Young is having recurrent pleural effusions.  He remains quite symptomatic today.  His wife will reach out to the radiology department to schedule ultrasound-guided thoracentesis already ordered by his pulmonologist to be done tomorrow or Monday.  We will then move forward with right and left heart catheterization to better understand his hemodynamics on Tuesday.  If his right and left heart filling pressures are normal, [name] will need to speak with his pulmonologist about Pleurx catheter placement versus pleurodesis.  Heart failure with recovered ejection fraction: [name] Young appears euvolemic on exam without edema or JVD.  His weight continues to trend down with escalation of his diuretic therapy.  Despite this, he has significant shortness of breath consistent with NYHA class IV symptoms in the setting of recurrent right pleural effusion.  I am not convinced that heart failure itself is driving this.  Given the clinical uncertainty, we will move forward with right and left heart catheterization and possible PCI on Tuesday.  Apixaban will need to be held for 2 days prior to the procedure with aspirin 81 mg daily started while off apixaban.  Given normal LVEF on last echo, I will defer rechallenging with GDMT at this time given hypotension in the past.  This will need to be readdressed after catheterization.  Coronary artery disease: No frank angina reported though question if some of [name] Young exertional dyspnea is related to coronary insufficiency.  We will plan for a left heart catheterization and coronary artery/graft angiography as part of catheterization next week.  Continue aggressive secondary prevention, including apixaban and lieu of aspirin and rosuvastatin 10  mg daily.  History of mitral valve endocarditis: No evidence of recurrence.  Continue clinical follow-up.  Aortic stenosis: Mild to moderate aortic stenosis and mild regurgitation noted on last echo in 12/2022.  This will be further evaluated on upcoming catheterization.  Paroxysmal atrial fibrillation: Continue apixaban (except to hold for cath as above).  EKG today with sinus rhythm, PAC's, and PVC's.    Informed Consent   Shared Decision Making/Informed Consent The risks [stroke (1 in 1000), death (1 in 1000), kidney failure [usually temporary] (1 in 500), bleeding (1 in 200), allergic reaction [possibly serious] (1 in 200)], benefits (diagnostic support and management of coronary artery disease) and alternatives of a cardiac catheterization were discussed in detail with Mr. Caralee Ates and he is willing to proceed.     Dispo: Return to clinic in 3 weeks.  Signed, [name] Kendall, MD

## 2023-11-16 ENCOUNTER — Ambulatory Visit
Admission: RE | Admit: 2023-11-16 | Discharge: 2023-11-16 | Disposition: A | Discharge status: Home / Self Care | Source: Ambulatory Visit | Attending: Pulmonary Disease | Admitting: Pulmonary Disease

## 2023-11-16 ENCOUNTER — Ambulatory Visit
Admission: RE | Admit: 2023-11-16 | Discharge: 2023-11-16 | Disposition: A | Discharge status: Home / Self Care | Source: Ambulatory Visit | Attending: Radiology | Admitting: Radiology

## 2023-11-16 ENCOUNTER — Other Ambulatory Visit: Payer: Self-pay | Admitting: Radiology

## 2023-11-16 DIAGNOSIS — J90 Pleural effusion, not elsewhere classified: Secondary | ICD-10-CM

## 2023-11-16 DIAGNOSIS — J9811 Atelectasis: Secondary | ICD-10-CM | POA: Insufficient documentation

## 2023-11-16 DIAGNOSIS — R846 Abnormal cytological findings in specimens from respiratory organs and thorax: Secondary | ICD-10-CM | POA: Diagnosis not present

## 2023-11-16 DIAGNOSIS — R0602 Shortness of breath: Secondary | ICD-10-CM | POA: Diagnosis not present

## 2023-11-16 DIAGNOSIS — Z48813 Encounter for surgical aftercare following surgery on the respiratory system: Secondary | ICD-10-CM | POA: Diagnosis not present

## 2023-11-16 DIAGNOSIS — Z951 Presence of aortocoronary bypass graft: Secondary | ICD-10-CM | POA: Diagnosis not present

## 2023-11-16 LAB — CBC
Hematocrit: 35 % — ABNORMAL LOW (ref 37.5–51.0)
Hemoglobin: 10.5 g/dL — ABNORMAL LOW (ref 13.0–17.7)
MCH: 25.7 pg — ABNORMAL LOW (ref 26.6–33.0)
MCHC: 30 g/dL — ABNORMAL LOW (ref 31.5–35.7)
MCV: 86 fL (ref 79–97)
Platelets: 379 10*3/uL (ref 150–450)
RBC: 4.08 x10E6/uL — ABNORMAL LOW (ref 4.14–5.80)
RDW: 15 % (ref 11.6–15.4)
WBC: 10.7 10*3/uL (ref 3.4–10.8)

## 2023-11-16 LAB — PROTEIN, PLEURAL OR PERITONEAL FLUID: Total protein, fluid: 5.2 g/dL

## 2023-11-16 LAB — LACTATE DEHYDROGENASE, PLEURAL OR PERITONEAL FLUID: LD, Fluid: 95 U/L — ABNORMAL HIGH (ref 3–23)

## 2023-11-16 MED ORDER — LIDOCAINE HCL (PF) 1 % IJ SOLN
10.0000 mL | Freq: Once | INTRAMUSCULAR | Status: AC
  Administered 2023-11-16: 10 mL via INTRADERMAL
  Filled 2023-11-16: qty 10

## 2023-11-16 NOTE — Procedures (Signed)
 PROCEDURE SUMMARY:  Successful US guided right sided thoracentesis. Yielded 800 mL of yellow fluid. Pt tolerated procedure well. No immediate complications.  CXR ordered. No evidence of post procedure pneumothorax upon initial review by Dr. Vella Redhead.  Labs collected.  EBL < 5 mL  Amair Shrout N Bashar Milam PA-C 11/16/2023 11:35 AM

## 2023-11-19 ENCOUNTER — Encounter: Payer: Self-pay | Admitting: Family Medicine

## 2023-11-19 LAB — BODY FLUID CULTURE W GRAM STAIN
Culture: NO GROWTH
Gram Stain: NONE SEEN

## 2023-11-19 LAB — CYTOLOGY - NON PAP

## 2023-11-20 ENCOUNTER — Inpatient Hospital Stay

## 2023-11-20 ENCOUNTER — Encounter: Payer: Self-pay | Admitting: Internal Medicine

## 2023-11-20 ENCOUNTER — Inpatient Hospital Stay
Admission: RE | Admit: 2023-11-20 | Discharge: 2023-11-29 | DRG: 919 | Disposition: A | Discharge status: Home Health | Source: Other Acute Inpatient Hospital | Attending: Internal Medicine | Admitting: Internal Medicine

## 2023-11-20 ENCOUNTER — Encounter
Admission: RE | Disposition: A | Discharge status: Home Health | Payer: Self-pay | Source: Other Acute Inpatient Hospital | Attending: Internal Medicine

## 2023-11-20 ENCOUNTER — Ambulatory Visit

## 2023-11-20 ENCOUNTER — Other Ambulatory Visit: Payer: Self-pay

## 2023-11-20 ENCOUNTER — Inpatient Hospital Stay: Admitting: Radiology

## 2023-11-20 DIAGNOSIS — I5022 Chronic systolic (congestive) heart failure: Secondary | ICD-10-CM | POA: Diagnosis not present

## 2023-11-20 DIAGNOSIS — I5042 Chronic combined systolic (congestive) and diastolic (congestive) heart failure: Secondary | ICD-10-CM | POA: Diagnosis present

## 2023-11-20 DIAGNOSIS — I700 Atherosclerosis of aorta: Secondary | ICD-10-CM | POA: Diagnosis not present

## 2023-11-20 DIAGNOSIS — Z7901 Long term (current) use of anticoagulants: Secondary | ICD-10-CM

## 2023-11-20 DIAGNOSIS — F40240 Claustrophobia: Secondary | ICD-10-CM | POA: Diagnosis present

## 2023-11-20 DIAGNOSIS — Z515 Encounter for palliative care: Secondary | ICD-10-CM

## 2023-11-20 DIAGNOSIS — Z87442 Personal history of urinary calculi: Secondary | ICD-10-CM

## 2023-11-20 DIAGNOSIS — I472 Ventricular tachycardia, unspecified: Secondary | ICD-10-CM | POA: Diagnosis present

## 2023-11-20 DIAGNOSIS — N183 Chronic kidney disease, stage 3 unspecified: Secondary | ICD-10-CM | POA: Diagnosis present

## 2023-11-20 DIAGNOSIS — E669 Obesity, unspecified: Secondary | ICD-10-CM | POA: Diagnosis present

## 2023-11-20 DIAGNOSIS — E785 Hyperlipidemia, unspecified: Secondary | ICD-10-CM | POA: Diagnosis present

## 2023-11-20 DIAGNOSIS — Z8701 Personal history of pneumonia (recurrent): Secondary | ICD-10-CM

## 2023-11-20 DIAGNOSIS — Z951 Presence of aortocoronary bypass graft: Secondary | ICD-10-CM

## 2023-11-20 DIAGNOSIS — J9811 Atelectasis: Secondary | ICD-10-CM | POA: Diagnosis present

## 2023-11-20 DIAGNOSIS — J9601 Acute respiratory failure with hypoxia: Secondary | ICD-10-CM | POA: Diagnosis not present

## 2023-11-20 DIAGNOSIS — J9621 Acute and chronic respiratory failure with hypoxia: Secondary | ICD-10-CM | POA: Diagnosis present

## 2023-11-20 DIAGNOSIS — E1122 Type 2 diabetes mellitus with diabetic chronic kidney disease: Secondary | ICD-10-CM | POA: Diagnosis present

## 2023-11-20 DIAGNOSIS — Z6833 Body mass index (BMI) 33.0-33.9, adult: Secondary | ICD-10-CM

## 2023-11-20 DIAGNOSIS — D696 Thrombocytopenia, unspecified: Secondary | ICD-10-CM | POA: Diagnosis present

## 2023-11-20 DIAGNOSIS — Z7982 Long term (current) use of aspirin: Secondary | ICD-10-CM

## 2023-11-20 DIAGNOSIS — T8189XA Other complications of procedures, not elsewhere classified, initial encounter: Principal | ICD-10-CM | POA: Diagnosis present

## 2023-11-20 DIAGNOSIS — I2510 Atherosclerotic heart disease of native coronary artery without angina pectoris: Secondary | ICD-10-CM

## 2023-11-20 DIAGNOSIS — I350 Nonrheumatic aortic (valve) stenosis: Secondary | ICD-10-CM

## 2023-11-20 DIAGNOSIS — Z955 Presence of coronary angioplasty implant and graft: Secondary | ICD-10-CM | POA: Diagnosis not present

## 2023-11-20 DIAGNOSIS — Z860101 Personal history of adenomatous and serrated colon polyps: Secondary | ICD-10-CM

## 2023-11-20 DIAGNOSIS — N179 Acute kidney failure, unspecified: Secondary | ICD-10-CM | POA: Diagnosis present

## 2023-11-20 DIAGNOSIS — K5900 Constipation, unspecified: Secondary | ICD-10-CM | POA: Diagnosis present

## 2023-11-20 DIAGNOSIS — Z83719 Family history of colon polyps, unspecified: Secondary | ICD-10-CM

## 2023-11-20 DIAGNOSIS — D72829 Elevated white blood cell count, unspecified: Secondary | ICD-10-CM | POA: Diagnosis present

## 2023-11-20 DIAGNOSIS — Z539 Procedure and treatment not carried out, unspecified reason: Secondary | ICD-10-CM | POA: Diagnosis present

## 2023-11-20 DIAGNOSIS — Z833 Family history of diabetes mellitus: Secondary | ICD-10-CM

## 2023-11-20 DIAGNOSIS — I5032 Chronic diastolic (congestive) heart failure: Secondary | ICD-10-CM | POA: Diagnosis not present

## 2023-11-20 DIAGNOSIS — Z800 Family history of malignant neoplasm of digestive organs: Secondary | ICD-10-CM

## 2023-11-20 DIAGNOSIS — D62 Acute posthemorrhagic anemia: Secondary | ICD-10-CM | POA: Diagnosis present

## 2023-11-20 DIAGNOSIS — I358 Other nonrheumatic aortic valve disorders: Secondary | ICD-10-CM

## 2023-11-20 DIAGNOSIS — Z79899 Other long term (current) drug therapy: Secondary | ICD-10-CM

## 2023-11-20 DIAGNOSIS — Z820 Family history of epilepsy and other diseases of the nervous system: Secondary | ICD-10-CM

## 2023-11-20 DIAGNOSIS — J811 Chronic pulmonary edema: Secondary | ICD-10-CM | POA: Diagnosis not present

## 2023-11-20 DIAGNOSIS — R918 Other nonspecific abnormal finding of lung field: Secondary | ICD-10-CM | POA: Diagnosis not present

## 2023-11-20 DIAGNOSIS — Z8249 Family history of ischemic heart disease and other diseases of the circulatory system: Secondary | ICD-10-CM

## 2023-11-20 DIAGNOSIS — I480 Paroxysmal atrial fibrillation: Secondary | ICD-10-CM | POA: Diagnosis present

## 2023-11-20 DIAGNOSIS — J90 Pleural effusion, not elsewhere classified: Secondary | ICD-10-CM | POA: Diagnosis not present

## 2023-11-20 DIAGNOSIS — Z4682 Encounter for fitting and adjustment of non-vascular catheter: Secondary | ICD-10-CM | POA: Diagnosis not present

## 2023-11-20 DIAGNOSIS — Z823 Family history of stroke: Secondary | ICD-10-CM

## 2023-11-20 DIAGNOSIS — J942 Hemothorax: Secondary | ICD-10-CM | POA: Diagnosis present

## 2023-11-20 DIAGNOSIS — R0600 Dyspnea, unspecified: Secondary | ICD-10-CM | POA: Diagnosis not present

## 2023-11-20 DIAGNOSIS — Z87891 Personal history of nicotine dependence: Secondary | ICD-10-CM

## 2023-11-20 DIAGNOSIS — I517 Cardiomegaly: Secondary | ICD-10-CM | POA: Diagnosis not present

## 2023-11-20 DIAGNOSIS — I130 Hypertensive heart and chronic kidney disease with heart failure and stage 1 through stage 4 chronic kidney disease, or unspecified chronic kidney disease: Secondary | ICD-10-CM | POA: Diagnosis present

## 2023-11-20 DIAGNOSIS — R0602 Shortness of breath: Secondary | ICD-10-CM | POA: Diagnosis not present

## 2023-11-20 DIAGNOSIS — N400 Enlarged prostate without lower urinary tract symptoms: Secondary | ICD-10-CM | POA: Diagnosis present

## 2023-11-20 DIAGNOSIS — I493 Ventricular premature depolarization: Secondary | ICD-10-CM | POA: Diagnosis present

## 2023-11-20 DIAGNOSIS — Z803 Family history of malignant neoplasm of breast: Secondary | ICD-10-CM

## 2023-11-20 DIAGNOSIS — D649 Anemia, unspecified: Secondary | ICD-10-CM | POA: Diagnosis not present

## 2023-11-20 HISTORY — PX: IR PERC PLEURAL DRAIN W/INDWELL CATH W/IMG GUIDE: IMG5383

## 2023-11-20 HISTORY — PX: IR GUIDED DRAIN W CATHETER PLACEMENT: IMG719

## 2023-11-20 LAB — CBC
HCT: 25.3 % — ABNORMAL LOW (ref 39.0–52.0)
Hemoglobin: 7.8 g/dL — ABNORMAL LOW (ref 13.0–17.0)
MCH: 26.7 pg (ref 26.0–34.0)
MCHC: 30.8 g/dL (ref 30.0–36.0)
MCV: 86.6 fL (ref 80.0–100.0)
Platelets: 332 10*3/uL (ref 150–400)
RBC: 2.92 MIL/uL — ABNORMAL LOW (ref 4.22–5.81)
RDW: 17.2 % — ABNORMAL HIGH (ref 11.5–15.5)
WBC: 11.3 10*3/uL — ABNORMAL HIGH (ref 4.0–10.5)
nRBC: 0.2 % (ref 0.0–0.2)

## 2023-11-20 LAB — CBC WITH DIFFERENTIAL/PLATELET
Abs Immature Granulocytes: 0.08 10*3/uL — ABNORMAL HIGH (ref 0.00–0.07)
Basophils Absolute: 0 10*3/uL (ref 0.0–0.1)
Basophils Relative: 0 %
Eosinophils Absolute: 0 10*3/uL (ref 0.0–0.5)
Eosinophils Relative: 0 %
HCT: 23.7 % — ABNORMAL LOW (ref 39.0–52.0)
Hemoglobin: 7.2 g/dL — ABNORMAL LOW (ref 13.0–17.0)
Immature Granulocytes: 1 %
Lymphocytes Relative: 7 %
Lymphs Abs: 1 10*3/uL (ref 0.7–4.0)
MCH: 26.4 pg (ref 26.0–34.0)
MCHC: 30.4 g/dL (ref 30.0–36.0)
MCV: 86.8 fL (ref 80.0–100.0)
Monocytes Absolute: 1 10*3/uL (ref 0.1–1.0)
Monocytes Relative: 8 %
Neutro Abs: 11.4 10*3/uL — ABNORMAL HIGH (ref 1.7–7.7)
Neutrophils Relative %: 84 %
Platelets: 363 10*3/uL (ref 150–400)
RBC: 2.73 MIL/uL — ABNORMAL LOW (ref 4.22–5.81)
RDW: 17.2 % — ABNORMAL HIGH (ref 11.5–15.5)
WBC: 13.5 10*3/uL — ABNORMAL HIGH (ref 4.0–10.5)
nRBC: 0.1 % (ref 0.0–0.2)

## 2023-11-20 LAB — HEMOGLOBIN AND HEMATOCRIT, BLOOD
HCT: 24.5 % — ABNORMAL LOW (ref 39.0–52.0)
Hemoglobin: 7.7 g/dL — ABNORMAL LOW (ref 13.0–17.0)

## 2023-11-20 LAB — PREPARE RBC (CROSSMATCH)

## 2023-11-20 LAB — COMPREHENSIVE METABOLIC PANEL
ALT: 38 U/L (ref 0–44)
AST: 37 U/L (ref 15–41)
Albumin: 2.4 g/dL — ABNORMAL LOW (ref 3.5–5.0)
Alkaline Phosphatase: 68 U/L (ref 38–126)
Anion gap: 10 (ref 5–15)
BUN: 75 mg/dL — ABNORMAL HIGH (ref 8–23)
CO2: 32 mmol/L (ref 22–32)
Calcium: 8.1 mg/dL — ABNORMAL LOW (ref 8.9–10.3)
Chloride: 91 mmol/L — ABNORMAL LOW (ref 98–111)
Creatinine, Ser: 1.71 mg/dL — ABNORMAL HIGH (ref 0.61–1.24)
GFR, Estimated: 40 mL/min — ABNORMAL LOW (ref >60)
Glucose, Bld: 202 mg/dL — ABNORMAL HIGH (ref 70–99)
Potassium: 4.6 mmol/L (ref 3.5–5.1)
Sodium: 133 mmol/L — ABNORMAL LOW (ref 135–145)
Total Bilirubin: 0.6 mg/dL (ref 0.0–1.2)
Total Protein: 7 g/dL (ref 6.5–8.1)

## 2023-11-20 LAB — GLUCOSE, CAPILLARY: Glucose-Capillary: 160 mg/dL — ABNORMAL HIGH (ref 70–99)

## 2023-11-20 LAB — BRAIN NATRIURETIC PEPTIDE: B Natriuretic Peptide: 188.4 pg/mL — ABNORMAL HIGH (ref 0.0–100.0)

## 2023-11-20 SURGERY — INVASIVE LAB ABORTED CASE

## 2023-11-20 MED ORDER — ASPIRIN 81 MG PO TBEC: 81.0000 mg | DELAYED_RELEASE_TABLET | Freq: Every day | ORAL | Status: DC

## 2023-11-20 MED ORDER — SODIUM CHLORIDE 0.9% IV SOLUTION
Freq: Once | INTRAVENOUS | Status: DC
Start: 2023-11-20 — End: 2023-11-29

## 2023-11-20 MED ORDER — FENTANYL CITRATE (PF) 100 MCG/2ML IJ SOLN
INTRAMUSCULAR | Status: AC
  Filled 2023-11-20: qty 2

## 2023-11-20 MED ORDER — LIDOCAINE HCL 1 % IJ SOLN
INTRAMUSCULAR | Status: AC
  Filled 2023-11-20: qty 20

## 2023-11-20 MED ORDER — NITROGLYCERIN 0.4 MG SL SUBL: 0.4000 mg | SUBLINGUAL_TABLET | SUBLINGUAL | Status: DC | PRN

## 2023-11-20 MED ORDER — MIDAZOLAM HCL 2 MG/2ML IJ SOLN
INTRAMUSCULAR | Status: AC
  Filled 2023-11-20: qty 2

## 2023-11-20 MED ORDER — FENTANYL CITRATE (PF) 100 MCG/2ML IJ SOLN
INTRAMUSCULAR | Status: AC | PRN
  Administered 2023-11-20: 25 ug via INTRAVENOUS

## 2023-11-20 MED ORDER — MIDAZOLAM HCL 2 MG/2ML IJ SOLN
INTRAMUSCULAR | Status: DC | PRN
  Administered 2023-11-20: .5 mg via INTRAVENOUS

## 2023-11-20 MED ORDER — FUROSEMIDE 40 MG PO TABS: 40.0000 mg | ORAL_TABLET | Freq: Every day | ORAL | Status: DC

## 2023-11-20 MED ORDER — INSULIN ASPART 100 UNIT/ML IJ SOLN
0.0000 [IU] | Freq: Three times a day (TID) | INTRAMUSCULAR | Status: DC
  Administered 2023-11-21: 2 [IU] via SUBCUTANEOUS
  Administered 2023-11-21: 5 [IU] via SUBCUTANEOUS
  Administered 2023-11-21 – 2023-11-22 (×2): 2 [IU] via SUBCUTANEOUS
  Administered 2023-11-22: 5 [IU] via SUBCUTANEOUS
  Administered 2023-11-22: 2 [IU] via SUBCUTANEOUS
  Administered 2023-11-23: 3 [IU] via SUBCUTANEOUS
  Administered 2023-11-23: 5 [IU] via SUBCUTANEOUS
  Administered 2023-11-23: 2 [IU] via SUBCUTANEOUS
  Administered 2023-11-24: 5 [IU] via SUBCUTANEOUS
  Administered 2023-11-24 – 2023-11-25 (×3): 2 [IU] via SUBCUTANEOUS
  Administered 2023-11-25 (×2): 5 [IU] via SUBCUTANEOUS
  Administered 2023-11-26: 2 [IU] via SUBCUTANEOUS
  Administered 2023-11-26: 5 [IU] via SUBCUTANEOUS
  Administered 2023-11-27: 3 [IU] via SUBCUTANEOUS
  Administered 2023-11-27 – 2023-11-28 (×3): 2 [IU] via SUBCUTANEOUS
  Administered 2023-11-28: 3 [IU] via SUBCUTANEOUS
  Administered 2023-11-29: 5 [IU] via SUBCUTANEOUS
  Filled 2023-11-20 (×21): qty 1

## 2023-11-20 MED ORDER — MIRTAZAPINE 15 MG PO TABS
15.0000 mg | ORAL_TABLET | Freq: Every day | ORAL | Status: DC
  Administered 2023-11-20 – 2023-11-28 (×9): 15 mg via ORAL
  Filled 2023-11-20 (×9): qty 1

## 2023-11-20 MED ORDER — ROSUVASTATIN CALCIUM 10 MG PO TABS
10.0000 mg | ORAL_TABLET | Freq: Every day | ORAL | Status: AC
Start: 2023-11-21 — End: ?
  Administered 2023-11-21 – 2023-11-29 (×9): 10 mg via ORAL
  Filled 2023-11-20 (×9): qty 1

## 2023-11-20 MED ORDER — HEPARIN (PORCINE) IN NACL 1000-0.9 UT/500ML-% IV SOLN
INTRAVENOUS | Status: AC
  Filled 2023-11-20: qty 1000

## 2023-11-20 MED ORDER — TAMSULOSIN HCL 0.4 MG PO CAPS: 0.4000 mg | ORAL_CAPSULE | Freq: Every day | ORAL | Status: DC

## 2023-11-20 MED ORDER — ASPIRIN 81 MG PO CHEW: 81.0000 mg | CHEWABLE_TABLET | ORAL | Status: DC

## 2023-11-20 MED ORDER — SODIUM CHLORIDE 0.9 % IV SOLN: INTRAVENOUS | Status: DC

## 2023-11-20 MED ORDER — MAGNESIUM OXIDE -MG SUPPLEMENT 400 (240 MG) MG PO TABS
200.0000 mg | ORAL_TABLET | Freq: Every day | ORAL | Status: DC
  Administered 2023-11-21 – 2023-11-22 (×2): 200 mg via ORAL
  Filled 2023-11-20 (×2): qty 1

## 2023-11-20 MED ORDER — FENTANYL CITRATE (PF) 100 MCG/2ML IJ SOLN
INTRAMUSCULAR | Status: DC | PRN
  Administered 2023-11-20: 12.5 ug via INTRAVENOUS

## 2023-11-20 MED ORDER — ORAL CARE MOUTH RINSE: 15.0000 mL | OROMUCOSAL | Status: DC | PRN

## 2023-11-20 MED ORDER — DOCUSATE SODIUM 100 MG PO CAPS
100.0000 mg | ORAL_CAPSULE | Freq: Two times a day (BID) | ORAL | Status: DC | PRN
  Administered 2023-11-22 – 2023-11-25 (×2): 100 mg via ORAL
  Filled 2023-11-20 (×2): qty 1

## 2023-11-20 MED ORDER — LIDOCAINE HCL (PF) 1 % IJ SOLN
5.0000 mL | Freq: Once | INTRAMUSCULAR | Status: AC
  Administered 2023-11-20: 5 mL

## 2023-11-20 MED ORDER — METOPROLOL SUCCINATE ER 25 MG PO TB24: 12.5000 mg | ORAL_TABLET | Freq: Every day | ORAL | Status: DC

## 2023-11-20 MED ORDER — HEPARIN SODIUM (PORCINE) 1000 UNIT/ML IJ SOLN
INTRAMUSCULAR | Status: AC
  Filled 2023-11-20: qty 10

## 2023-11-20 MED ORDER — INSULIN ASPART 100 UNIT/ML IJ SOLN
0.0000 [IU] | Freq: Every day | INTRAMUSCULAR | Status: DC
  Administered 2023-11-22: 2 [IU] via SUBCUTANEOUS
  Filled 2023-11-20 (×3): qty 1

## 2023-11-20 MED ORDER — VERAPAMIL HCL 2.5 MG/ML IV SOLN
INTRAVENOUS | Status: AC
  Filled 2023-11-20: qty 2

## 2023-11-20 MED ORDER — POLYETHYLENE GLYCOL 3350 17 G PO PACK
17.0000 g | PACK | Freq: Every day | ORAL | Status: DC | PRN
  Administered 2023-11-22: 17 g via ORAL
  Filled 2023-11-20: qty 1

## 2023-11-20 MED ORDER — HEPARIN (PORCINE) IN NACL 2000-0.9 UNIT/L-% IV SOLN
INTRAVENOUS | Status: DC | PRN
  Administered 2023-11-20: 1000 mL

## 2023-11-20 SURGICAL SUPPLY — 8 items
DRAPE BRACHIAL (DRAPES) IMPLANT
GUIDEWIRE INQWIRE 1.5J.035X260 (WIRE) IMPLANT
INQWIRE 1.5J .035X260CM (WIRE) IMPLANT
PACK CARDIAC CATH (CUSTOM PROCEDURE TRAY) ×1 IMPLANT
PROTECTION STATION PRESSURIZED (MISCELLANEOUS) IMPLANT
SET ATX-X65L (MISCELLANEOUS) IMPLANT
SHEATH GLIDE SLENDER 4/5FR (SHEATH) IMPLANT
STATION PROTECTION PRESSURIZED (MISCELLANEOUS) IMPLANT

## 2023-11-20 NOTE — Progress Notes (Signed)
 Patient clinically stable post Chest tube placement per Dr Loreta Ave, with 1800 ml bloody output, changed to new pleurovac system prior to transfer to ICU 14. Fentanyl 25 mcg IV given for procedure secondarily to oxygenation for procedure.  Wife present with update given. Transported to ICU post procedure. Report given to [facility] post procedure at bedsdie.

## 2023-11-20 NOTE — Procedures (Addendum)
 Interventional Radiology Procedure Note  Procedure: Image guided right chest tube placement.  51F pigtail drain.  Complications: None  EBL: None Findings: ~1700cc of bloody output. Output slowed at conclusion.  Fresh (second) pleurevac chamber @ 4:26pm.   Recommendations: - Routine chest tube care, record output - continue H&H trend. - if any clinical change or if ongoing steady bloody output, will need CTA imaging - hold [facility] - routine wound care - Call team aware - VIR to follow - ok to advance diet per primary order  Signed,  Yvone Neu. Loreta Ave, DO, ABVM, RPVI

## 2023-11-20 NOTE — Interval H&P Note (Signed)
 History and Physical Interval Note:  11/20/2023 11:15 AM  [name] Young.  has presented today for surgery, with the diagnosis of chronic heart failure with improved ejection fraction and coronary artery disease.  The various methods of treatment have been discussed with the patient and family. After consideration of risks, benefits and other options for treatment, the patient has consented to  Procedure(s): RIGHT/LEFT HEART CATH AND CORONARY/GRAFT ANGIOGRAPHY (Bilateral) as a surgical intervention.  The patient's history has been reviewed, patient examined, no change in status, stable for surgery.  I have reviewed the patient's chart and labs.  Questions were answered to the patient's satisfaction.    Cath Lab Visit (complete for each Cath Lab visit)  Clinical Evaluation Leading to the Procedure:   ACS: No.  Non-ACS:    Anginal/Heart Failure Classification: NYHA class IV  Anti-ischemic medical therapy: Minimal Therapy (1 class of medications)  Non-Invasive Test Results: No non-invasive testing performed  Prior CABG: Previous CABG  [name] Young

## 2023-11-20 NOTE — Consult Note (Signed)
 Chief Complaint: Patient was seen in consultation today for right hemothorax   Referring Physician(s): Dr. Larinda Buttery  Supervising Physician: Gilmer Mor  Patient Status: [facility] - Out-pt  History of Present Illness: [name] Iannello. is a 83 y.o. male with a medical history significant for chronic kidney disease stage III, BPH, HTN, DM2, paroxysmal atrial fibrillation (Eliquis) and heart failure with recurrent pleural effusions. He is familiar to IR from multiple thoracenteses with the last one on 11/16/23. 800 ml of yellow fluid was removed and post-procedure CXR was negative for a pneumothorax. Shortly after leaving the hospital he developed pleuritic right-sided chest pain that persisted.  He presented to the hospital today for a right and left heart catheterization. Fluoroscopy of the right chest was performed before access was obtained and this showed a large right pleural effusion with near complete collapse of the right lung. The catheterization was cancelled and additional imaging obtained shows a hemothorax. His labs showed a 3 gm drop in hemoglobin.   Patient will be admitted to the ICU. PRBCs have been ordered. Interventional Radiology has been asked to evaluate patient for an image-guided right chest tube. Imaging reviewed and procedure approved by Dr. Loreta Ave.   Past Medical History:  Diagnosis Date   Adenomatous colon polyp    Aortic atherosclerosis (HCC)    Arthritis    Ascending aorta dilatation (HCC)    a.) TTE 11/23/2017: asc Ao measured 37 mm; b.) TTE 10/23/2021: Ao root measured 41 mm, asc Ao measured 38 mm; c.) TEE 10/28/2021: asc Ao measured 38 mm; d.) TTE 01/11/2022: Ao root 40 mm, asc Ao 39 mm   Ascending cholangitis 10/2021   BPH (benign prostatic hyperplasia)    Cardiomyopathy (HCC)    CKD (chronic kidney disease), stage III (HCC)    Claustrophobia    Coronary artery disease    a.) LHC 12/21/2017: 75% mLAD, 90% D1, 80-99% OM1/2, CTO pRCA (L-R collaterals)  --> CVTS consult. 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   In setting of bacteremia from ascending colangitis   GERD (gastroesophageal reflux disease)    HFrEF (heart failure with reduced ejection fraction) (HCC)    a.) TTE 11/23/2017: EF 50-55%, mod MAC, triv TR, G1DD; b.) TTE 10/23/2021: EF 40-45%, mild LAE, Ao sclerosis, triv MR, G2DD; c.) TEE 10/28/2021: EF 40-45%, glob HK, mobile vegitation on MV; d.) TTE 01/11/2022: EF 50-55%, mild LVH, RVE, Ao sclerosis, mild MR/AR, G1DD   History of cholelithiasis    History of kidney stones    ca ox Vonita Moss @ [facility]) now [employer]   History of pneumonia    HLD (hyperlipidemia)    HTN (hypertension)    Jaundice    age 23   Long term current use of anticoagulant    a.) apixaban   Paroxysmal atrial fibrillation (HCC)    a.) CHA2DS2VASc = 6 (age x 2, HFrEF, HTN, vascular disease history, T2DM);  b.) rate/rhythm maintained on oral metoprolol succinate; chronically anticoagulated with apixaban   Pneumonia    Right-sided carotid artery disease (HCC)    a.) carotid doppler 04/05/2022: 1-39% RICA   S/P CABG x 3    a.) LIMA-LAD, SVG-diagonal, SVG-PL branch of RCA   S/P cataract extraction and insertion of intraocular lens    T2DM (type 2 diabetes mellitus) (HCC) 2010    Past Surgical History:  Procedure Laterality Date   CARPAL TUNNEL RELEASE Bilateral    CATARACT EXTRACTION, BILATERAL  COLONOSCOPY  11/2012   12 adenomatous polyps, diverticulosis, rec rpt 1 yr [name])   COLONOSCOPY  12/2013   3 polyps, diverticulosis, rec rpt 3 yrs [name])   COLONOSCOPY  06/2019   6 polyps (TA), diverticulosis, f/u left open ended [name])   CORONARY ARTERY BYPASS GRAFT N/A 01/18/2018   Procedure: CORONARY ARTERY BYPASS GRAFTING (CABG) x 3; Using Left Internal Mammary Artery, and Right Greater Saphenous Vein harvested Endoscopically, Coronary Artery Endarterectomy;  Surgeon: Kerin Perna, MD;   Location: [facility] OR;  Service: Open Heart Surgery;  Laterality: N/A;   ERCP N/A 10/23/2021   Procedure: ENDOSCOPIC RETROGRADE CHOLANGIOPANCREATOGRAPHY (ERCP);  Surgeon: Meryl Dare, MD;  Location: [facility] ENDOSCOPY;  Service: Endoscopy;  Laterality: N/A;   IR CATHETER TUBE CHANGE  01/08/2023   IR RADIOLOGIST EVAL & MGMT  01/23/2023   IR THORACENTESIS ASP PLEURAL SPACE W/IMG GUIDE  12/29/2022   KNEE CARTILAGE SURGERY Left    LEFT HEART CATH AND CORONARY ANGIOGRAPHY N/A 12/21/2017   Procedure: LEFT HEART CATH AND CORONARY ANGIOGRAPHY;  Surgeon: Yvonne Kendall, MD;  Location: [facility] INVASIVE CV LAB;  Service: Cardiovascular;  Laterality: N/A;   LITHOTRIPSY     REMOVAL OF STONES  10/23/2021   Procedure: REMOVAL OF STONES;  Surgeon: Meryl Dare, MD;  Location: [facility] ENDOSCOPY;  Service: Endoscopy;;   SPHINCTEROTOMY  10/23/2021   Procedure: Dennison Mascot;  Surgeon: Meryl Dare, MD;  Location: [facility] ENDOSCOPY;  Service: Endoscopy;;   TEE WITHOUT CARDIOVERSION N/A 01/18/2018   Procedure: TRANSESOPHAGEAL ECHOCARDIOGRAM (TEE);  Surgeon: Donata Clay, Theron Arista, MD;  Location: [facility] OR;  Service: Open Heart Surgery;  Laterality: N/A;   TEE WITHOUT CARDIOVERSION N/A 10/28/2021   Procedure: TRANSESOPHAGEAL ECHOCARDIOGRAM (TEE);  Surgeon: Chrystie Nose, MD;  Location: [facility] ENDOSCOPY;  Service: Cardiovascular;  Laterality: N/A;   UMBILICAL HERNIA REPAIR     with mesh    Allergies: Patient has no known allergies.  Medications: Prior to Admission medications   Medication Sig Start Date End Date Taking? Authorizing Provider  aspirin EC 81 MG tablet Take 81 mg by mouth daily. Swallow whole.   Yes [provider]  Cholecalciferol (VITAMIN D3) 25 MCG (1000 UT) CAPS Take 1 capsule (1,000 Units total) by mouth daily. 05/24/20  Yes [name], MD  Cyanocobalamin (B-12) 1000 MCG CAPS Take 1 capsule by mouth every Monday, Wednesday, and Friday. 07/16/23  Yes [name], MD  ELIQUIS 5 MG TABS tablet TAKE 1  TABLET BY MOUTH TWICE A DAY 09/19/23  Yes End, [name], MD  feeding supplement (ENSURE ENLIVE / ENSURE PLUS) LIQD Take 237 mLs by mouth 2 (two) times daily between meals. 01/05/23  Yes Wieting, Richard, MD  furosemide (LASIX) 40 MG tablet Take 1 tablet (40 mg total) by mouth daily. 09/19/23  Yes End, [name], MD  Magnesium 250 MG TABS Take 250 mg by mouth daily.   Yes [provider]  metolazone (ZAROXOLYN) 2.5 MG tablet Take 1 tablet (2.5 mg total) by mouth daily. 11/05/23  Yes Olalere, Adewale A, MD  metoprolol succinate (TOPROL-XL) 25 MG 24 hr tablet Take 0.5 tablets (12.5 mg total) by mouth daily. Take with or immediately following a meal. 07/16/23  Yes [name], MD  mirtazapine (REMERON) 15 MG tablet Take 1 tablet (15 mg total) by mouth at bedtime. 07/16/23  Yes [name], MD  Multiple Vitamins-Minerals (PRESERVISION AREDS 2 PO) Take 1 capsule by mouth daily.   Yes [provider]  nitroGLYCERIN (NITROSTAT) 0.4 MG SL tablet Place 1  tablet (0.4 mg total) under the tongue every 5 (five) minutes as needed for chest pain. 03/26/20  Yes End, [name], MD  rosuvastatin (CRESTOR) 10 MG tablet TAKE 1 TABLET BY MOUTH EVERY DAY 09/17/23  Yes End, [name], MD  tamsulosin (FLOMAX) 0.4 MG CAPS capsule Take 1 capsule (0.4 mg total) by mouth daily. 07/16/23  Yes [name], MD  doxycycline (VIBRA-TABS) 100 MG tablet Take 1 tablet (100 mg total) by mouth 2 (two) times daily. Patient not taking: Reported on 11/13/2023 11/05/23   Tomma Lightning, MD  glucose blood (TRUE METRIX BLOOD GLUCOSE TEST) test strip Use as instructed to check blood sugar once a day 03/06/23   [name], MD     Family History  Problem Relation Age of Onset   Alzheimer's disease Mother    Breast cancer Mother        breast   Atrial fibrillation Mother    CAD Mother    Stroke Father 17   Diabetes Father    CAD Father    Colon polyps Sister    Colon polyps Brother    Rectal  cancer Maternal Grandfather        rectal   Colon cancer Maternal Grandfather 88   Esophageal cancer Neg Hx    Stomach cancer Neg Hx     Social History   Socioeconomic History   Marital status: Married    Spouse name: Not on file   Number of children: Not on file   Years of education: Not on file   Highest education level: 12th grade  Occupational History   Not on file  Tobacco Use   Smoking status: Never    Passive exposure: Past   Smokeless tobacco: Former    Types: Chew    Quit date: 09/08/2001  Vaping Use   Vaping status: Never Used  Substance and Sexual Activity   Alcohol use: Not Currently    Comment: occ   Drug use: No   Sexual activity: Not Currently  Other Topics Concern   Not on file  Social History Narrative   Caffeine: occasional Lives with wife, grandson [name] 2009)Occupation: retired, 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.Diet: some water, fruits/vegetables daily, avoids potatoes   Social Drivers of Health   Financial Resource Strain: Low Risk  (10/08/2023)   Overall Financial Resource Strain (CARDIA)    Difficulty of Paying Living Expenses: Not hard at all  Food Insecurity: No Food Insecurity (10/08/2023)   Hunger Vital Sign    Worried About Running Out of Food in the Last Year: Never true    Ran Out of Food in the Last Year: Never true  Transportation Needs: No Transportation Needs (10/08/2023)   PRAPARE - Administrator, Civil Service (Medical): No    Lack of Transportation (Non-Medical): No  Physical Activity: Unknown (10/08/2023)   Exercise Vital Sign    Days of Exercise per Week: 0 days    Minutes of Exercise per Session: Not on file  Stress: Patient Declined (10/08/2023)   [employer] of Occupational Health - Occupational Stress Questionnaire    Feeling of Stress : Patient declined  Social Connections: Unknown (10/08/2023)   Social Connection and Isolation Panel [NHANES]    Frequency of Communication with  Friends and Family: Twice a week    Frequency of Social Gatherings with Friends and Family: Patient declined    Attends Religious Services: More than 4 times per year    Active Member of  Clubs or Organizations: Yes    Attends Engineer, structural: More than 4 times per year    Marital Status: Married    Review of Systems: A 12 point ROS discussed and pertinent positives are indicated in the HPI above.  All other systems are negative.  Review of Systems  Constitutional:  Positive for fatigue.  Respiratory:  Positive for cough and shortness of breath.   All other systems reviewed and are negative.   Vital Signs: BP (!) 102/43   Pulse 63   Temp (!) 97.5 F (36.4 C) (Oral)   Resp (!) 21   Ht 5\' 10"  (1.778 m)   Wt 234 lb (106.1 kg)   SpO2 97%   BMI 33.58 kg/m   Physical Exam Constitutional:      General: He is not in acute distress.    Appearance: He is ill-appearing.  HENT:     Mouth/Throat:     Mouth: Mucous membranes are moist.     Pharynx: Oropharynx is clear.  Cardiovascular:     Rate and Rhythm: Normal rate.  Pulmonary:     Effort: Pulmonary effort is normal.  Abdominal:     Tenderness: There is no abdominal tenderness.  Musculoskeletal:     Right lower leg: Edema present.     Left lower leg: Edema present.  Skin:    General: Skin is warm and dry.  Neurological:     Mental Status: He is alert and oriented to person, place, and time.  Psychiatric:        Mood and Affect: Mood normal.        Behavior: Behavior normal.        Thought Content: Thought content normal.        Judgment: Judgment normal.     Imaging: ABORTED INVASIVE LAB PROCEDURE Result Date: 11/20/2023 This case was aborted.  CARDIAC CATHETERIZATION Result Date: 11/20/2023 This case was aborted.  DG Chest Port 1 View Result Date: 11/16/2023 CLINICAL DATA:  Status post right thoracentesis. EXAM: PORTABLE CHEST 1 VIEW COMPARISON:  November 05, 2023. FINDINGS: Stable cardiomediastinal  silhouette. Status post coronary bypass graft. Left lung is clear. No pneumothorax is noted status post right thoracentesis. Small right pleural effusion is noted with associated atelectasis. Bony thorax is unremarkable. IMPRESSION: No pneumothorax status post right thoracentesis. Small right pleural effusion with associated atelectasis. Electronically Signed   By: Lupita Raider M.D.   On: 11/16/2023 13:53   US THORACENTESIS ASP PLEURAL SPACE W/IMG GUIDE Result Date: 11/16/2023 INDICATION: Patient is a 83 y/o male with history of CKD, CABG x 3, and CHF. Patient presents for a diagnostic and therapeutic thoracentesis due to a recurrent right pleural effusion. EXAM: ULTRASOUND GUIDED RIGHT THORACENTESIS MEDICATIONS: 8mL lidocaine 1 % COMPLICATIONS: None immediate. PROCEDURE: An ultrasound guided thoracentesis was thoroughly discussed with the patient and questions answered. The benefits, risks, alternatives and complications were also discussed. The patient understands and wishes to proceed with the procedure. Written consent was obtained. Ultrasound was performed to localize and mark an adequate pocket of fluid in the right chest. The area was then prepped and draped in the normal sterile fashion. 1% Lidocaine was used for local anesthesia. Under ultrasound guidance a 6 Fr Safe-T-Centesis catheter was introduced. Thoracentesis was performed. The catheter was removed and a dressing applied. FINDINGS: A total of approximately 800 mL of yellow fluid was removed. Samples were sent to the laboratory as requested by the clinical team. IMPRESSION: Successful ultrasound guided right thoracentesis yielding  800 mL of pleural fluid. Performed by Philipp Ovens PA-C Electronically Signed   By: Malachy Moan M.D.   On: 11/16/2023 12:01   DG Chest 2 View Result Date: 11/12/2023 CLINICAL DATA:  83 year old male with pleural effusion EXAM: CHEST - 2 VIEW COMPARISON:  10/26/2023 FINDINGS: Cardiomediastinal silhouette  unchanged in size and contour. Surgical changes of median sternotomy and CABG. New opacity at the right lung base with obscuration of the right heart border, and right hemidiaphragm. Meniscus on the lateral view. Left lung relatively well aerated. No interlobular septal thickening. Degenerative changes spine.  No displaced fracture IMPRESSION: Recurrent right-sided pleural effusion with associated atelectasis/consolidation Electronically Signed   By: Gilmer Mor D.O.   On: 11/12/2023 14:48   DG Chest Port 1 View Result Date: 10/29/2023 CLINICAL DATA:  Pleural effusion, post thoracentesis EXAM: PORTABLE CHEST - 1 VIEW COMPARISON:  10/19/2023 FINDINGS: No pneumothorax. Near complete evacuation of the right pleural effusion. Improved aeration at the right lung base. Left lung clear. Heart size and mediastinal contours are within normal limits. Aortic Atherosclerosis (ICD10-170.0). CABG markers. Sternotomy wires. IMPRESSION: No acute cardiopulmonary disease. Electronically Signed   By: Corlis Leak M.D.   On: 10/29/2023 08:11   US THORACENTESIS ASP PLEURAL SPACE W/IMG GUIDE Result Date: 10/26/2023 INDICATION: History of CAD status post CABG x3 and CHF. Found to have recurrent right-sided pleural effusion. Request is for therapeutic right-sided thoracentesis. EXAM: ULTRASOUND GUIDED THORACENTESIS MEDICATIONS: 1% lidocaine 10 mL COMPLICATIONS: None immediate. PROCEDURE: An ultrasound guided thoracentesis was thoroughly discussed with the patient and questions answered. The benefits, risks, alternatives and complications were also discussed. The patient understands and wishes to proceed with the procedure. Written consent was obtained. Ultrasound was performed to localize and mark an adequate pocket of fluid in the right chest. The area was then prepped and draped in the normal sterile fashion. 1% Lidocaine was used for local anesthesia. Under ultrasound guidance a 6 Fr Safe-T-Centesis catheter was introduced.  Thoracentesis was performed. The catheter was removed and a dressing applied. FINDINGS: A total of approximately 1.5 L of clear yellow fluid was removed. IMPRESSION: Successful ultrasound guided right thoracentesis yielding 1.5 L of pleural fluid. Procedure performed by Alwyn Ren NP Electronically Signed   By: Corlis Leak M.D.   On: 10/26/2023 15:54    Labs:  CBC: Recent Labs    07/09/23 0742 09/17/23 1427 11/15/23 1030 11/20/23 1220  WBC 7.3 9.0 10.7 13.5*  HGB 12.4* 12.0* 10.5* 7.2*  HCT 39.3 38.9 35.0* 23.7*  PLT 198.0 278 379 363    COAGS: Recent Labs    12/29/22 0329 01/08/23 0440  INR 1.6* 1.5*  APTT  --  45*    BMP: Recent Labs    01/11/23 0506 01/12/23 0518 01/21/23 0840 03/05/23 1153 09/17/23 1427 10/03/23 1124 11/13/23 1034 11/20/23 1220  NA 138 140 137   < > 142 141 137 133*  K 3.9 3.8 3.9   < > 4.7 4.7 4.1 4.6  CL 106 108 106   < > 103 101 94* 91*  CO2 26 26 23    < > 24 25 34* 32  GLUCOSE 88 89 159*   < > 119* 228* 223* 202*  BUN 11 10 10    < > 24 30* 38* 75*  CALCIUM 8.1* 8.4* 8.5*   < > 9.0 8.9 9.0 8.1*  CREATININE 0.90 0.83 0.82   < > 1.23 1.25 1.09 1.71*  GFRNONAA >60 >60 >60  --   --   --   --  40*   < > = values in this interval not displayed.    LIVER FUNCTION TESTS: Recent Labs    04/25/23 1516 07/09/23 0742 09/17/23 1427 11/13/23 1034 11/20/23 1220  BILITOT 0.6 0.6 0.4  --  0.6  AST 21 18 28   --  37  ALT 13 23 25   --  38  ALKPHOS 60 58 87  --  68  PROT 7.3 6.8 6.6  --  7.0  ALBUMIN 3.7 3.8 3.6* 3.1* 2.4*    TUMOR MARKERS: No results for input(s): "AFPTM", "CEA", "CA199", "CHROMGRNA" in the last 8760 hours.  Assessment and Plan:  Right hemothorax: Brittney Caraway. Sheral Apley., 83 year old male, is scheduled today for an image-guided right chest tube. The procedure was discussed with the patient and his wife who was at the bedside.   Risks and benefits discussed with the patient including bleeding, infection, damage to  adjacent structures, bowel perforation/fistula connection, and sepsis.  All of the patient's questions were answered, patient is agreeable to proceed. He drank a cup of apple juice approximately 2 hours ago.   Consent signed and in chart.  Thank you for this interesting consult.  I greatly enjoyed meeting [name] Young. and look forward to participating in their care.  A copy of this report was sent to the requesting provider on this date.  Electronically Signed: Alwyn Ren, AGACNP-BC 11/20/2023, 3:09 PM   I spent a total of 20 Minutes    in face to face in clinical consultation, greater than 50% of which was counseling/coordinating care for right hemothorax.

## 2023-11-20 NOTE — Progress Notes (Signed)
 Patient Name: [name] Young. Date of Encounter: 11/20/2023 [facility] Cardiologist: Yvonne Kendall, MD   Interval Summary  .    [name] Young presents today for right and left heart catheterization for further evaluation of shortness of breath and recurrent right pleural effusions.  He underwent therapeutic right thoracentesis on Friday (4 days ago) and notes that he did not have any improvement in his dyspnea.  Shortly after leaving the hospital, he also developed pleuritic right-sided chest pain that has persisted.  He reports being compliant with his medications, though apixaban is on hold for today's catheterization.  He also endorses productive cough with thick sputum.  He has not had any fevers but reports intermittent chills predating his recent thoracentesis.  Patient was prepped for catheterization.  Fluoroscopy of the right chest was performed before access was obtained.  This showed large right pleural effusion with near complete collapse of the right lung.  On account of this, catheterization was canceled and the patient returned to recovery.  Chest radiograph obtained at that time confirmed a large right pleural effusion.  CBC also showed a 3 gm drop in hemoglobin.  Vital Signs .    Vitals:   11/20/23 1122 11/20/23 1127 11/20/23 1132 11/20/23 1137  BP: 125/79 (!) 136/59 (!) 138/55 133/65  Pulse: 98 96 94 95  Resp: (!) 21 (!) 23 (!) 35 (!) 27  Temp:      TempSrc:      SpO2:   (!) 87% (!) 87%  Weight:      Height:       No intake or output data in the 24 hours ending 11/20/23 1301    11/20/2023    9:37 AM 11/15/2023    9:20 AM 11/13/2023    9:50 AM  Last 3 Weights  Weight (lbs) 234 lb 238 lb 239 lb 4 oz  Weight (kg) 106.142 kg 107.956 kg 108.523 kg      Telemetry/ECG    Atrial fibrillation with PVCs versus a Baron C, heart rate 80-110 bpm - Personally Reviewed  Physical Exam .   GEN: No acute distress.   Neck: No JVD Cardiac: Irregularly irregular  rhythm with 2/6 systolic murmur. Respiratory: Clear left lung.  Right breath sounds markedly diminished throughout. GI: Soft, nontender, non-distended  MS: No edema  Assessment & Plan .     Hemothorax: I suspect recurrent large right pleural effusion represents a hemothorax following last week's thoracentesis with significant drop in hemoglobin.  Given that hemoglobin is less than 8 and [name] Young is short of breath with history of CAD, we will plan for PRBC transfusion x 1.  Consent has been obtained for PRBC transfusion. -PRBC transfusion x 1 for target hemoglobin greater then 8. -Hold anticoagulation and antiplatelet therapy. -Pulmonary critical care consulted and will admit the patient to ICU.  Coronary artery disease: No angina reported.  Defer catheterization until hemothorax has been managed. -Hold aspirin in the setting of hemothorax.  Resume when appropriate per PCCM. -Continue statin therapy for secondary prevention.  Paroxysmal atrial fibrillation: Patient in atrial fibrillation at this time, likely exacerbated by hemothorax and acute blood loss anemia. -Continue to hold anticoagulation. -Continue metoprolol succinate 12.5 mg daily as blood pressure allows. -If additional rate control needed, consider short-term course of digoxin.  Favor avoiding amiodarone unless no other rate control option is available in the setting of the patient having been off anticoagulation and risk for pharmacologic cardioversion with cardioembolic stroke. -If vasopressor is needed  for hypotension during the admission, would favor using phenylephrine as a first-line agent to minimize tachycardia.  Chronic heart failure with improved ejection fraction: [name] Young does not appear significantly volume overloaded.  His recurrent right pleural effusions have remained difficult to understand.  Right and left heart catheterization was planned for today but canceled on account of iatrogenic hemothorax from  recent thoracentesis. -Maintain net even fluid balance; may need temporary escalation of diuretic regimen to minimize risk for TACO with PRBC transfusion. -Plan for right and left heart catheterization once hemothorax has stabilized and periprocedural anticoagulation/antiplatelet therapy can be administered.  Acute on chronic respiratory failure with hypoxia: I suspect this is driven primarily by hemothorax with near complete collapse of the right lung. -PCCM consulted to assist with management.  Will likely need chest tube placement and close follow-up in ICU. -Titrate supplemental oxygen to maintain oxygen saturation greater than 90%.  CRITICAL CARE Performed by: Yvonne Kendall, MD  Total critical care time: 40 minutes  Critical care time was exclusive of separately billable procedures and treating other patients.  Critical care was necessary to treat or prevent imminent or life-threatening deterioration.  Critical care was time spent personally by me (independent of midlevel providers or residents) on the following activities: development of treatment plan with patient and/or surrogate as well as nursing, discussions with consultants, evaluation of patient's response to treatment, examination of patient, obtaining history from patient or surrogate, ordering and performing treatments and interventions, ordering and review of laboratory studies, ordering and review of radiographic studies, pulse oximetry and re-evaluation of patient's condition.  For questions or updates, please contact [facility] Please consult [email or web address] for contact info under [facility].     Signed, Yvonne Kendall, MD

## 2023-11-20 NOTE — H&P (Signed)
 NAME:  Dagan Heinz., MRN:  [id number], DOB:  Jan 02, 1941, LOS: 0 ADMISSION DATE:  11/20/2023  History of Present Illness:  Mr. Schollmeyer is a pleasant 83 year old male patient with a past medical history of recurrent pleural effusion status post multiple thoracentesis thought to be transudate presenting to [facility] for an outpatient left heart cath and right heart cath.  He was found to have a complete whiteout of the right hemithorax suspicious for a hemothorax.  Patient underwent thoracentesis on 03/07 and drained 800 cc of serous fluid.  And since then he has been complaining of sharp right-sided chest pain associated with worsening shortness of breath.  He presents today for a left heart cath and was noted on fluoroscopy to have a near complete whiteout of the right hemithorax.  Therefore left heart cath was canceled.  Labs were noticeable for drop in hemoglobin from 10 g/dL at baseline to 7.1 g/dL at baseline.  A CT scan without contrast of the chest was obtained which showed a large right pleural effusion with heterogeneous densities highly suspicious for a hemothorax.  IR graciously placed a right 22 [place] skater with 1.8 L of serosanguineous fluid drained.  He remained hemodynamically throughout procedure.  Pertinent  Medical History  As above  Significant Hospital Events: Including procedures, antibiotic start and stop dates in addition to other pertinent events   11/20/2023 Admitted to the ICU s/p right 16 Fr Skater. 1.8L Serosanguinous fluid drained.   Objective   Blood pressure (!) 115/92, pulse 87, temperature 98.2 F (36.8 C), temperature source Oral, resp. rate (!) 21, height 5\' 10"  (1.778 m), weight 106.1 kg, SpO2 100%.        Intake/Output Summary (Last 24 hours) at 11/20/2023 1806 Last data filed at 11/20/2023 1600 Gross per 24 hour  Intake 20 ml  Output 1800 ml  Net -1780 ml   Filed Weights   11/20/23 0937  Weight: 106.1 kg    Examination: General: Alert  awake  and oriented. HD stable.  HENT: Supple neck reactive pupils Lungs: Improved breath sounds over the left hemithorax.  Cardiovascular: Normal S1, Normal S2, RRR, no murmurs or ES appreciated Abdomen: Soft, non tender, non distended, +BS  Extremities: Warm and well perfused.   Labs and imaging were reviewed.   Assessment & Plan:  Mr. Coston is a pleasant 83 year old male patient with a past medical history of recurrent pleural effusion status post multiple thoracentesis thought to be transudate presenting to [facility] for an outpatient left heart cath and right heart cath.  He was found to have a complete whiteout of the right hemithorax suspicious for a hemothorax.  #Acute hypoxic respiratory failure secondary to...  #Large right hemothorax in the setting of recent thoracentesis on 11/16/2023 while being on Eliquis. S/p 16Fr Skater placement on 03/11. Appears to be venous bleed given hemodynamic stability and lack of ongoing pleural drainage.  #AKI like prerenal in the setting acute anemia  #Acute Anemia in the setting of the above.  #Paroxysmal A.fib on eliquis.   Neuro:  No issues  CVS: Holding eliquis and aspirin. Resume home metoprolol in the am. Keep holding hoime lasix and metolazone. Resume home rosuvastatin.  Lungs: CT to suction at -20cmH2O, Daily CXR. Will decide on TPA in the am.  GI: Ok for diet  Renal: Monitor urine output, avoid nephrotox agents.  Heme: 1PRBC, cbc this pm, SCDs for DVT prophylaxis.  Best Practice (right click and "Reselect all SmartList Selections" daily)   Diet/type: Regular consistency (  see orders) DVT prophylaxis SCD Pressure ulcer(s): N/A GI prophylaxis: N/A Lines: N/A Foley:  N/A Code Status:  full code   Critical care time: 60 minutes  Janann Colonel, MD [place] [place] [facility] 11/20/2023 6:16 PM

## 2023-11-20 NOTE — Plan of Care (Signed)

## 2023-11-20 NOTE — Progress Notes (Signed)
 PHARMACY CONSULT NOTE - FOLLOW UP  Pharmacy Consult for Electrolyte Monitoring and Replacement   Recent Labs: Potassium (mmol/L)  Date Value  11/20/2023 4.6  02/01/2010 4.5   Magnesium (mg/dL)  Date Value  84/13/2440 2.0   Calcium (mg/dL)  Date Value  07/08/2535 8.1 (L)  02/01/2010 9.2   Albumin (g/dL)  Date Value  64/40/3474 2.4 (L)  09/17/2023 3.6 (L)   Phosphorus (mg/dL)  Date Value  25/95/6387 3.9   Sodium (mmol/L)  Date Value  11/20/2023 133 (L)  10/03/2023 141   Assessment: [name] Young is a 83 yo male who presented for surgery with a history significant for frustration due to recurrent shortness of breath. They have a history of fatigue and exertional dyspnea with minimal activity that improved following thoracentesis in January/Feburary. At a previous appointment, they asked for another thoracentesis to provide relief and asked about a more permanent solution like Pleurx catheter to help relieve their history of SOB. Pharmacy was consulted to manage this patient's electrolytes while they're in the ICU.   Goal of Therapy:  Electrolytes WNL  Plan:  No replacement indicated at this time Continue to follow electrolytes with AM labs  Effie Shy, PharmD Pharmacy Resident  11/20/2023 1:37 PM

## 2023-11-21 ENCOUNTER — Encounter: Payer: Self-pay | Admitting: Family Medicine

## 2023-11-21 ENCOUNTER — Inpatient Hospital Stay

## 2023-11-21 DIAGNOSIS — I700 Atherosclerosis of aorta: Secondary | ICD-10-CM | POA: Diagnosis not present

## 2023-11-21 DIAGNOSIS — J9601 Acute respiratory failure with hypoxia: Secondary | ICD-10-CM | POA: Diagnosis not present

## 2023-11-21 DIAGNOSIS — J9811 Atelectasis: Secondary | ICD-10-CM | POA: Diagnosis not present

## 2023-11-21 DIAGNOSIS — I2510 Atherosclerotic heart disease of native coronary artery without angina pectoris: Secondary | ICD-10-CM

## 2023-11-21 DIAGNOSIS — J942 Hemothorax: Secondary | ICD-10-CM

## 2023-11-21 DIAGNOSIS — I5032 Chronic diastolic (congestive) heart failure: Secondary | ICD-10-CM | POA: Diagnosis not present

## 2023-11-21 DIAGNOSIS — I517 Cardiomegaly: Secondary | ICD-10-CM | POA: Diagnosis not present

## 2023-11-21 DIAGNOSIS — I480 Paroxysmal atrial fibrillation: Secondary | ICD-10-CM | POA: Diagnosis not present

## 2023-11-21 DIAGNOSIS — R0602 Shortness of breath: Secondary | ICD-10-CM

## 2023-11-21 DIAGNOSIS — Z4682 Encounter for fitting and adjustment of non-vascular catheter: Secondary | ICD-10-CM | POA: Diagnosis not present

## 2023-11-21 DIAGNOSIS — J90 Pleural effusion, not elsewhere classified: Secondary | ICD-10-CM | POA: Diagnosis not present

## 2023-11-21 DIAGNOSIS — R0600 Dyspnea, unspecified: Secondary | ICD-10-CM | POA: Diagnosis not present

## 2023-11-21 DIAGNOSIS — I350 Nonrheumatic aortic (valve) stenosis: Secondary | ICD-10-CM

## 2023-11-21 LAB — CBC
HCT: 27.2 % — ABNORMAL LOW (ref 39.0–52.0)
HCT: 27.7 % — ABNORMAL LOW (ref 39.0–52.0)
Hemoglobin: 8.4 g/dL — ABNORMAL LOW (ref 13.0–17.0)
Hemoglobin: 8.3 g/dL — ABNORMAL LOW (ref 13.0–17.0)
MCH: 26.6 pg (ref 26.0–34.0)
MCH: 26.3 pg (ref 26.0–34.0)
MCHC: 30.9 g/dL (ref 30.0–36.0)
MCHC: 30 g/dL (ref 30.0–36.0)
MCV: 86.1 fL (ref 80.0–100.0)
MCV: 87.9 fL (ref 80.0–100.0)
Platelets: 336 10*3/uL (ref 150–400)
Platelets: 378 10*3/uL (ref 150–400)
RBC: 3.16 MIL/uL — ABNORMAL LOW (ref 4.22–5.81)
RBC: 3.15 MIL/uL — ABNORMAL LOW (ref 4.22–5.81)
RDW: 17.6 % — ABNORMAL HIGH (ref 11.5–15.5)
RDW: 17.2 % — ABNORMAL HIGH (ref 11.5–15.5)
WBC: 12.7 10*3/uL — ABNORMAL HIGH (ref 4.0–10.5)
WBC: 12.9 10*3/uL — ABNORMAL HIGH (ref 4.0–10.5)
nRBC: 0 % (ref 0.0–0.2)
nRBC: 0.2 % (ref 0.0–0.2)

## 2023-11-21 LAB — TYPE AND SCREEN
ABO/RH(D): A POS
Antibody Screen: NEGATIVE
Unit division: 0

## 2023-11-21 LAB — BPAM RBC
Blood Product Expiration Date: 202504122359
ISSUE DATE / TIME: 202503111509
Unit Type and Rh: 6200

## 2023-11-21 LAB — CBC WITH DIFFERENTIAL/PLATELET
Abs Immature Granulocytes: 0.1 10*3/uL — ABNORMAL HIGH (ref 0.00–0.07)
Basophils Absolute: 0 10*3/uL (ref 0.0–0.1)
Basophils Relative: 0 %
Eosinophils Absolute: 0.2 10*3/uL (ref 0.0–0.5)
Eosinophils Relative: 2 %
HCT: 25.9 % — ABNORMAL LOW (ref 39.0–52.0)
Hemoglobin: 8.1 g/dL — ABNORMAL LOW (ref 13.0–17.0)
Immature Granulocytes: 1 %
Lymphocytes Relative: 16 %
Lymphs Abs: 1.9 10*3/uL (ref 0.7–4.0)
MCH: 27 pg (ref 26.0–34.0)
MCHC: 31.3 g/dL (ref 30.0–36.0)
MCV: 86.3 fL (ref 80.0–100.0)
Monocytes Absolute: 1 10*3/uL (ref 0.1–1.0)
Monocytes Relative: 9 %
Neutro Abs: 8.6 10*3/uL — ABNORMAL HIGH (ref 1.7–7.7)
Neutrophils Relative %: 72 %
Platelets: 345 10*3/uL (ref 150–400)
RBC: 3 MIL/uL — ABNORMAL LOW (ref 4.22–5.81)
RDW: 17.2 % — ABNORMAL HIGH (ref 11.5–15.5)
WBC: 11.8 10*3/uL — ABNORMAL HIGH (ref 4.0–10.5)
nRBC: 0.2 % (ref 0.0–0.2)

## 2023-11-21 LAB — RENAL FUNCTION PANEL
Albumin: 2.2 g/dL — ABNORMAL LOW (ref 3.5–5.0)
Anion gap: 9 (ref 5–15)
BUN: 67 mg/dL — ABNORMAL HIGH (ref 8–23)
CO2: 31 mmol/L (ref 22–32)
Calcium: 7.8 mg/dL — ABNORMAL LOW (ref 8.9–10.3)
Chloride: 94 mmol/L — ABNORMAL LOW (ref 98–111)
Creatinine, Ser: 1.45 mg/dL — ABNORMAL HIGH (ref 0.61–1.24)
GFR, Estimated: 48 mL/min — ABNORMAL LOW (ref >60)
Glucose, Bld: 150 mg/dL — ABNORMAL HIGH (ref 70–99)
Phosphorus: 4.3 mg/dL (ref 2.5–4.6)
Potassium: 4.2 mmol/L (ref 3.5–5.1)
Sodium: 134 mmol/L — ABNORMAL LOW (ref 135–145)

## 2023-11-21 LAB — GLUCOSE, CAPILLARY
Glucose-Capillary: 194 mg/dL — ABNORMAL HIGH (ref 70–99)
Glucose-Capillary: 146 mg/dL — ABNORMAL HIGH (ref 70–99)
Glucose-Capillary: 211 mg/dL — ABNORMAL HIGH (ref 70–99)
Glucose-Capillary: 125 mg/dL — ABNORMAL HIGH (ref 70–99)
Glucose-Capillary: 191 mg/dL — ABNORMAL HIGH (ref 70–99)

## 2023-11-21 LAB — MAGNESIUM: Magnesium: 3.1 mg/dL — ABNORMAL HIGH (ref 1.7–2.4)

## 2023-11-21 LAB — CHOLESTEROL, BODY FLUID: Cholesterol, Fluid: 49 mg/dL

## 2023-11-21 LAB — PROTIME-INR
INR: 1.4 — ABNORMAL HIGH (ref 0.8–1.2)
Prothrombin Time: 16.9 s — ABNORMAL HIGH (ref 11.4–15.2)

## 2023-11-21 MED ORDER — FENTANYL CITRATE PF 50 MCG/ML IJ SOSY
PREFILLED_SYRINGE | INTRAMUSCULAR | Status: AC
  Administered 2023-11-21: 25 ug via INTRAVENOUS
  Filled 2023-11-21: qty 1

## 2023-11-21 MED ORDER — METOPROLOL TARTRATE 25 MG PO TABS
12.5000 mg | ORAL_TABLET | Freq: Two times a day (BID) | ORAL | Status: DC
  Administered 2023-11-21: 12.5 mg via ORAL
  Filled 2023-11-21: qty 1

## 2023-11-21 MED ORDER — METOPROLOL TARTRATE 25 MG PO TABS
25.0000 mg | ORAL_TABLET | Freq: Two times a day (BID) | ORAL | Status: AC
Start: 2023-11-21 — End: ?
  Administered 2023-11-21 – 2023-11-29 (×14): 25 mg via ORAL
  Filled 2023-11-21 (×15): qty 1

## 2023-11-21 MED ORDER — METOPROLOL TARTRATE 5 MG/5ML IV SOLN
5.0000 mg | Freq: Once | INTRAVENOUS | Status: AC
  Administered 2023-11-21: 5 mg via INTRAVENOUS
  Filled 2023-11-21: qty 5

## 2023-11-21 MED ORDER — FENTANYL CITRATE PF 50 MCG/ML IJ SOSY: 25.0000 ug | PREFILLED_SYRINGE | Freq: Once | INTRAMUSCULAR | Status: AC

## 2023-11-21 MED ORDER — SODIUM CHLORIDE (PF) 0.9 % IJ SOLN
10.0000 mg | Freq: Once | INTRAMUSCULAR | Status: AC
  Administered 2023-11-21: 10 mg via INTRAPLEURAL
  Filled 2023-11-21: qty 10

## 2023-11-21 MED ORDER — METOPROLOL SUCCINATE ER 25 MG PO TB24
12.5000 mg | ORAL_TABLET | Freq: Every day | ORAL | Status: DC
  Filled 2023-11-21: qty 0.5

## 2023-11-21 MED ORDER — METOPROLOL TARTRATE 25 MG PO TABS
12.5000 mg | ORAL_TABLET | Freq: Once | ORAL | Status: AC
  Administered 2023-11-21: 12.5 mg via ORAL
  Filled 2023-11-21: qty 1

## 2023-11-21 MED ORDER — FENTANYL CITRATE PF 50 MCG/ML IJ SOSY
25.0000 ug | PREFILLED_SYRINGE | INTRAMUSCULAR | Status: DC | PRN
  Administered 2023-11-21: 25 ug via INTRAVENOUS
  Filled 2023-11-21: qty 1

## 2023-11-21 MED ORDER — OXYCODONE HCL 5 MG PO TABS
5.0000 mg | ORAL_TABLET | ORAL | Status: DC | PRN
  Administered 2023-11-21 – 2023-11-26 (×3): 5 mg via ORAL
  Filled 2023-11-21 (×3): qty 1

## 2023-11-21 MED ORDER — LACTATED RINGERS IV BOLUS
500.0000 mL | Freq: Once | INTRAVENOUS | Status: AC
  Administered 2023-11-21: 500 mL via INTRAVENOUS

## 2023-11-21 MED ORDER — CHLORHEXIDINE GLUCONATE CLOTH 2 % EX PADS
6.0000 | MEDICATED_PAD | Freq: Every day | CUTANEOUS | Status: DC
  Administered 2023-11-22 – 2023-11-28 (×7): 6 via TOPICAL

## 2023-11-21 MED ORDER — ACETAMINOPHEN 325 MG PO TABS
650.0000 mg | ORAL_TABLET | Freq: Four times a day (QID) | ORAL | Status: DC
  Administered 2023-11-21 – 2023-11-29 (×31): 650 mg via ORAL
  Filled 2023-11-21 (×31): qty 2

## 2023-11-21 NOTE — Progress Notes (Signed)
 NAME:  Haziel Molner., MRN:  [id number], DOB:  Sep 15, 1940, LOS: 1 ADMISSION DATE:  11/20/2023  History of Present Illness:  Mr. Biel is a pleasant 83 year old male patient with a past medical history of recurrent pleural effusion status post multiple thoracentesis thought to be transudate presenting to [facility] for an outpatient left heart cath and right heart cath.  He was found to have a complete whiteout of the right hemithorax suspicious for a hemothorax.  Patient underwent thoracentesis on 03/07 and drained 800 cc of serous fluid.  And since then he has been complaining of sharp right-sided chest pain associated with worsening shortness of breath.  He presents today for a left heart cath and was noted on fluoroscopy to have a near complete whiteout of the right hemithorax.  Therefore left heart cath was canceled.  Labs were noticeable for drop in hemoglobin from 10 g/dL at baseline to 7.1 g/dL at baseline.  A CT scan without contrast of the chest was obtained which showed a large right pleural effusion with heterogeneous densities highly suspicious for a hemothorax.  IR graciously placed a right 61 [place] skater with 1.8 L of serosanguineous fluid drained.  He remained hemodynamically throughout procedure.  Pertinent  Medical History  As above  Significant Hospital Events: Including procedures, antibiotic start and stop dates in addition to other pertinent events   11/20/2023 Admitted to the ICU s/p right 16 Fr Skater. 1.8L Serosanguinous fluid drained.   Subjective:  Reports breathing has improved. Has no major complaints.  No significant output overnight after the 1800cc drainage post chest tube placement.  CXR this AM with persistent RLL moderate to large effusion.  Chest tube flushed this am.   Objective   Blood pressure (!) 118/48, pulse 62, temperature 98.1 F (36.7 C), temperature source Oral, resp. rate (!) 22, height 5\' 10"  (1.778 m), weight 106.1 kg, SpO2 100%.         Intake/Output Summary (Last 24 hours) at 11/21/2023 0855 Last data filed at 11/21/2023 0800 Gross per 24 hour  Intake 318 ml  Output 2550 ml  Net -2232 ml   Filed Weights   11/20/23 0937 11/21/23 0500  Weight: 106.1 kg 106.1 kg    Examination: General: Alert awake  and oriented. HD stable.  HENT: Supple neck reactive pupils Lungs: Improved breath sounds over the left hemithorax.  Cardiovascular: Normal S1, Normal S2, RRR, no murmurs or ES appreciated Abdomen: Soft, non tender, non distended, +BS  Extremities: Warm and well perfused.   Labs and imaging were reviewed.   Assessment & Plan:  Mr. Stankowski is a pleasant 83 year old male patient with a past medical history of recurrent pleural effusion status post multiple thoracentesis thought to be transudate presenting to [facility] for an outpatient left heart cath and right heart cath.  He was found to have a complete whiteout of the right hemithorax suspicious for a hemothorax.  #Acute hypoxic respiratory failure secondary to...  #Large right hemothorax in the setting of recent thoracentesis on 11/16/2023 while being on Eliquis. S/p 16Fr Skater placement on 03/11. Appears to be venous bleed given hemodynamic stability and lack of ongoing pleural drainage.  #AKI like prerenal in the setting acute anemia  #Acute Anemia in the setting of the above.  #Paroxysmal A.fib on eliquis.   Neuro:  No issues  CVS: Holding eliquis and aspirin. Resume home metoprolol Keep holding hoime lasix and metolazone. C/w home rosuvastatin.  Lungs: CT to suction at -20cmH2O, Daily CXR. INR and PT  pending and plan to start intrapleural tpa today.  GI: Ok for diet  Renal: Monitor urine output, avoid nephrotox agents.  Heme: 1PRBC 03/11, SCDs for DVT prophylaxis.  Best Practice (right click and "Reselect all SmartList Selections" daily)   Diet/type: Regular consistency (see orders) DVT prophylaxis SCD Pressure ulcer(s): N/A GI prophylaxis: N/A Lines:  N/A Foley:  N/A Code Status:  full code   Critical care time: 45 minutes  Janann Colonel, MD [place] [facility] 11/21/2023 8:55 AM

## 2023-11-21 NOTE — Progress Notes (Signed)
 Rounding Note    Patient Name: [name] Young. Date of Encounter: 11/21/2023  [facility] Cardiologist: Yvonne Kendall, MD   Subjective   Patient reports ongoing fatigue. Telemetry shows sinus rhythm with frequent bouts of NSVT up to 18 beats. Chest tube placed yesterday with -1.9 L output.   Inpatient Medications    Scheduled Meds:  sodium chloride   Intravenous Once   alteplase (CATHFLO ACTIVASE) 10 mg in sodium chloride (PF) 0.9 % 30 mL  10 mg Intrapleural Once   insulin aspart  0-15 Units Subcutaneous TID WC   insulin aspart  0-5 Units Subcutaneous QHS   magnesium oxide  200 mg Oral Daily   metoprolol tartrate  12.5 mg Oral BID   mirtazapine  15 mg Oral QHS   rosuvastatin  10 mg Oral Daily   Continuous Infusions:  PRN Meds: docusate sodium, nitroGLYCERIN, mouth rinse, polyethylene glycol   Vital Signs    Vitals:   11/21/23 0300 11/21/23 0400 11/21/23 0500 11/21/23 0600  BP: (!) 114/58 114/70 (!) 119/52 (!) 118/48  Pulse: 96 87 87 62  Resp: 18 (!) 26 (!) 22 (!) 22  Temp:  98.1 F (36.7 C)    TempSrc:  Oral    SpO2: 98% 100% 100% 100%  Weight:   106.1 kg   Height:        Intake/Output Summary (Last 24 hours) at 11/21/2023 1039 Last data filed at 11/21/2023 0800 Gross per 24 hour  Intake 318 ml  Output 2550 ml  Net -2232 ml      11/21/2023    5:00 AM 11/20/2023    9:37 AM 11/15/2023    9:20 AM  Last 3 Weights  Weight (lbs) 234 lb 234 lb 238 lb  Weight (kg) 106.142 kg 106.142 kg 107.956 kg      Telemetry    Sinus rhythm with frequent NSVT up to 18 beats - Personally Reviewed  Physical Exam   GEN: No acute distress.   Neck: No JVD Cardiac: RRR, no murmurs, rubs, or gallops.  Respiratory: Clear to auscultation bilaterally. Right chest tube in place.  GI: Soft, nontender, non-distended  MS: No edema; No deformity. Neuro:  Nonfocal  Psych: Normal affect   Labs    High Sensitivity Troponin:  No results for input(s): "TROPONINIHS"  in the last 720 hours.   Chemistry Recent Labs  Lab 11/20/23 1220 11/21/23 0239  NA 133* 134*  K 4.6 4.2  CL 91* 94*  CO2 32 31  GLUCOSE 202* 150*  BUN 75* 67*  CREATININE 1.71* 1.45*  CALCIUM 8.1* 7.8*  MG  --  3.1*  PROT 7.0  --   ALBUMIN 2.4* 2.2*  AST 37  --   ALT 38  --   ALKPHOS 68  --   BILITOT 0.6  --   GFRNONAA 40* 48*  ANIONGAP 10 9    Lipids No results for input(s): "CHOL", "TRIG", "HDL", "LABVLDL", "LDLCALC", "CHOLHDL" in the last 168 hours.  Hematology Recent Labs  Lab 11/20/23 1220 11/20/23 1824 11/20/23 1926 11/21/23 0239  WBC 13.5* 11.3*  --  11.8*  RBC 2.73* 2.92*  --  3.00*  HGB 7.2* 7.8* 7.7* 8.1*  HCT 23.7* 25.3* 24.5* 25.9*  MCV 86.8 86.6  --  86.3  MCH 26.4 26.7  --  27.0  MCHC 30.4 30.8  --  31.3  RDW 17.2* 17.2*  --  17.2*  PLT 363 332  --  345   Thyroid No results  for input(s): "TSH", "FREET4" in the last 168 hours.  BNP Recent Labs  Lab 11/20/23 1220  BNP 188.4*    DDimer No results for input(s): "DDIMER" in the last 168 hours.   Radiology    DG Chest Port 1 View Result Date: 11/20/2023 IMPRESSION: 1. Right pigtail catheter with decreased right pleural effusion. Small pleural fluid persists with fluid tracking in the fissure. 2. Mild ill-defined opacity in the right lung base, likely atelectasis. Electronically Signed   By: Narda Rutherford M.D.   On: 11/20/2023 22:42   ABORTED INVASIVE LAB PROCEDURE Addendum Date: 11/20/2023 Mr. Caralee Ates presented for right and left heart catheterization with possible PCI for evaluation of progressive shortness of breath in the setting of recurrent right pleural effusions.  He underwent therapeutic thoracentesis 4 days ago due to orthopnea.  On arrival today, he reported no significant improvement in his dyspnea following the procedure.  He was also found to be back in atrial fibrillation.  He was positioned on the catheterization table and fluoroscopy of the chest performed, revealing a large right  pleural effusion with near complete collapse of the right lung (minimal effusion present on postthoracentesis chest radiograph 4 days ago).  Catheterization was canceled; venous and arterial access were never obtained.  Patient was transferred back to the holding area for further workup and management of large right pleural effusion, concerning for hemothorax given recent right thoracentesis and rapid accumulation of fluid. Result Date: 11/20/2023 This case was aborted.  IR Guided Drain W Catheter Placement Result Date: 11/20/2023 A proximally 1700 cc of hemothorax of evacuated. A new pleurevac chamber was attached 4:26pm. Retention suture was placed.  Sterile dressing was placed. Patient tolerated the procedure well and remained hemodynamically stable throughout. No complications were encountered and no significant blood loss was encounter IMPRESSION: Status post right-sided thoracostomy tube for hemothorax. Signed, Yvone Neu. Miachel Roux, RPVI [facility] Radiology Electronically Signed   By: Gilmer Mor D.O.   On: 11/20/2023 16:45   CT CHEST WO CONTRAST Result Date: 11/20/2023 IMPRESSION: 1. Large right pleural effusion, partially loculated. It is difficult to delineate compressed lung in the right lower lobe from complex fluid, however suspect heterogeneous increased density within the right pleural effusion dependently and laterally. This may represent blood products or infection. Consider fluid sampling or contrast-enhanced exam for more more detailed assessment 2. Complete consolidation throughout the right lower lobe with subtotal consolidation in the dependent right upper and right middle lobes. It is unclear if this represents compressive atelectasis or infection. Cannot exclude underlying neoplasm. 3. Fluid posterior to the right lobe of the liver with area of decreased density in the posterior right lobe. This has increased from September CT.  Etiology is indeterminate. Abdominal CT (with IV contrast in the absence of contraindication) would be helpful for more detailed assessment. 4. Retained mucus within the trachea. Aortic Atherosclerosis (ICD10-I70.0). Electronically Signed   By: Narda Rutherford M.D.   On: 11/20/2023 15:25   DG Chest Port 1 View Result Date: 11/20/2023 IMPRESSION: Progressive right pleural effusion, now large and partially loculated. Associated opacity throughout the right hemithorax is indeterminate for atelectasis or airspace disease. Electronically Signed   By: Narda Rutherford M.D.   On: 11/20/2023 15:15   Patient Profile     83 y.o. male with a history of three vessel CAD s/p CABG (10/2017) complicated by postoperative afib, ascending cholangitis, complicated by recurrent afib, heart failure with recovered EF (24-45%->50-55%), and mitral valve endocarditis,  as well as hypertension, hyperlipidemia, T2DM, hepatic abscess, and CKD III who presented to [facility] for outpatient cardiac catheterization but was found to have large right hemothorax.   Assessment & Plan    Hemothorax -Recurrent large right hemothorax following thoracentesis last Friday with significant drop in hemoglobin 10.5>7.2 - S/p PRBC transfusion - Hgb now 8.1, goal > 8 - Chest tube placed yesterday - Patient is hemodynamically stable - Hold anticoagulation and antiplatelet therapy - PCCM following  Coronary artery disease - Presented 3/11 for outpatient cardiac cath but case was aborted due to finding of large right hemothorax. Will defer until resolution of hemothorax.  - Denies chest pain - Hold ASA in the setting of above, resume when appropriate per PCCM - Continue statin for secondary prevention  NSVT - Frequent runs of NSVT on telemetry up to 18 beats likely exacerbated by hemothorax and acute blood loss anemia - Will increase metoprolol tartrate to 25 mg BID  Paroxysmal atrial fibrillation - Patient was reportedly in afib yesterday,  however since arrival to ICU he has been in sinus rhythm on telemetry - Continue to hold anticoagulation as above  - Metoprolol increased as above  Chronic heart failure with improved ejection fraction - Echo 12/2022 with LVEF 50-55% - Appears euvolemic on exam - Recurrent right pleural effusions remain difficult to understand - Will monitor volume status s/p blood transfusion. Consider restarting diuretic regimen to minimize risk for TACO.  - R/LHC was planned for yesterday but cancelled due to large hemothorax, will defer until stable  Acute on chronic respiratory failure with hypoxia - Likely driven by hemothorax with near complete collapse of the right lung - Chest tube placed yesterday - Continue supplemental O2 as needed - Continued management per PCCM  For questions or updates, please contact [facility] Please consult [email or web address] for contact info under        Signed, Orion Crook, PA-C  11/21/2023, 10:39 AM

## 2023-11-21 NOTE — Procedures (Signed)
 Pleural Fibrinolytic Administration Procedure Note  [name] Young  [id number]  1941/03/14  Date:11/21/23  Time:3:27 PM   Provider Performing:Jean-Pierre Wyley Hack   Procedure: Pleural Fibrinolysis Initial day [phone])  Indication(s) Fibrinolysis of complicated pleural effusion  Consent Risks of the procedure as well as the alternatives and risks of each were explained to the patient and/or caregiver.  Consent for the procedure was obtained.  Anesthesia None  Time Out Verified patient identification, verified procedure, site/side was marked, verified correct patient position, special equipment/implants available, medications/allergies/relevant history reviewed, required imaging and test results available.  Sterile Technique Hand hygiene, gloves  Procedure Description Existing pleural catheter was cleaned and accessed in sterile manner.  10mg  of tPA in 30cc of saline were injected into pleural space using existing pleural catheter.  Catheter will be clamped for 1 hour and then placed back to suction.   Complications/Tolerance None; patient tolerated the procedure well.  EBL None  Specimen(s) None

## 2023-11-21 NOTE — Progress Notes (Signed)
 PHARMACY CONSULT NOTE - FOLLOW UP  Pharmacy Consult for Electrolyte Monitoring and Replacement   Recent Labs: Potassium (mmol/L)  Date Value  11/21/2023 4.2  02/01/2010 4.5   Magnesium (mg/dL)  Date Value  21/30/8657 3.1 (H)   Calcium (mg/dL)  Date Value  84/69/6295 7.8 (L)  02/01/2010 9.2   Albumin (g/dL)  Date Value  28/41/3244 2.2 (L)  09/17/2023 3.6 (L)   Phosphorus (mg/dL)  Date Value  09/13/7251 4.3   Sodium (mmol/L)  Date Value  11/21/2023 134 (L)  10/03/2023 141   Assessment: [name] Young is a 83 yo male who presented for surgery with a history significant for frustration due to recurrent shortness of breath. They have a history of fatigue and exertional dyspnea with minimal activity that improved following thoracentesis in January/Feburary. At a previous appointment, they asked for another thoracentesis to provide relief and asked about a more permanent solution like Pleurx catheter to help relieve their history of SOB. Pharmacy was consulted to manage this patient's electrolytes while they're in the ICU.   Goal of Therapy:  Electrolytes WNL  Plan:  No replacement indicated at this time Continue to follow electrolytes with AM labs  Effie Shy, PharmD Pharmacy Resident  11/21/2023 6:16 AM

## 2023-11-21 NOTE — Final Progress Note (Signed)
 Accepted from PCCM. [facility] will assume care on 11/22/23 @ 7 am.

## 2023-11-21 NOTE — Evaluation (Signed)
 Physical Therapy Evaluation Patient Details Name: [name] Young. MRN: [id number] DOB: 1941-02-15 Today's Date: 11/21/2023  History of Present Illness  83 year old male patient with a past medical history of recurrent pleural effusion status post multiple thoracentesis thought to be transudate presenting to [facility] for an outpatient left heart cath and right heart cath.  He was found to have a complete whiteout of the right hemithorax suspicious for a hemothorax.     Patient underwent thoracentesis on 03/07 and drained 800 cc of serous fluid.  Clinical Impression  Pt sleeping on arrival, c/o pain and did not want to do a lot but did show good effort for what he was able.  Pt was able to get up to sitting EOB w/o much assist, did needed help getting to standing (from bed and chair, has lift chair at home).  He was able to stand and do some limited ambulation to/from recliner but c/o significant R flank pain at the chest tube site that was functionally limiting for him.  Pt is weak and far from his baseline, will require further PT to address functional limitations.        If plan is discharge home, recommend the following: A lot of help with walking and/or transfers;A lot of help with bathing/dressing/bathroom;Assistance with cooking/housework;Assist for transportation;Help with stairs or ramp for entrance   Can travel by private vehicle   No    Equipment Recommendations  (TBD)  Recommendations for Other Services       Functional Status Assessment Patient has had a recent decline in their functional status and demonstrates the ability to make significant improvements in function in a reasonable and predictable amount of time.     Precautions / Restrictions Precautions Precautions: Fall (chest tube, continuous suction) Restrictions Weight Bearing Restrictions Per Provider Order: No      Mobility  Bed Mobility Overal bed mobility: Needs Assistance Bed Mobility: Supine to Sit, Sit  to Supine     Supine to sit: Min assist Sit to supine: Mod assist, +2 for physical assistance   General bed mobility comments: Pt showed great effort in getting up to sitting, needing only light assist.  Pain after being up and in recliner was a big limiter getting back to bed.  PT and RN assisted heavily to get back to supine    Transfers Overall transfer level: Needs assistance Equipment used: Rolling walker (2 wheels) Transfers: Sit to/from Stand Sit to Stand: Min assist, From elevated surface, Mod assist           General transfer comment: struggle to rise from standard height bed, heavy UE reliance.   Heavier assist to rise from recliner.    Ambulation/Gait Ambulation/Gait assistance: Min assist Gait Distance (Feet): 5 Feet Assistive device: Rolling walker (2 wheels)         General Gait Details: Pt was able to take a few steps from bed to recliner with slow and guarded effort, heavy UE reliance, c/o pain in R flank t/o the effort  Stairs            Wheelchair Mobility     Tilt Bed    Modified Rankin (Stroke Patients Only)       Balance Overall balance assessment: Needs assistance Sitting-balance support: Bilateral upper extremity supported Sitting balance-Leahy Scale: Fair     Standing balance support: Bilateral upper extremity supported Standing balance-Leahy Scale: Fair  Pertinent Vitals/Pain Pain Assessment Pain Assessment: 0-10 Pain Score: 9  Pain Location: R ribs/chest tube insertion    Home Living Family/patient expects to be discharged to:: Private residence Living Arrangements: Spouse/significant other Available Help at Discharge: Family;Available 24 hours/day Type of Home: House Home Access: Stairs to enter Entrance Stairs-Rails: Right Entrance Stairs-Number of Steps: 3   Home Layout: One level Home Equipment: Agricultural consultant (2 wheels);Cane - single point;Hand held shower head;Tub  bench;Other (comment)      Prior Function Prior Level of Function : Needs assist             Mobility Comments: reports he mostly uses SPC but will use walker when needed ADLs Comments: Reports he does not drive but can get out of the home using cane or walker, wife assists with most ADLs     Extremity/Trunk Assessment   Upper Extremity Assessment Upper Extremity Assessment: Generalized weakness;Overall WFL for tasks assessed    Lower Extremity Assessment Lower Extremity Assessment: Generalized weakness       Communication   Communication Communication: No apparent difficulties    Cognition Arousal: Alert Behavior During Therapy: Restless, Impulsive   PT - Cognitive impairments: No apparent impairments                       PT - Cognition Comments: Pt sleeping on arrival but able to wake and interact, some general lethargy - at times impulsive due to pain Following commands: Intact, Impaired Following commands impaired: Only follows one step commands consistently     Cueing Cueing Techniques: Verbal cues, Gestural cues, Tactile cues     General Comments General comments (skin integrity, edema, etc.): on 2L t/o session, SpO2 high 90s at rest and low 90s with activity.  BP remains in the 100-110/40-50 range    Exercises     Assessment/Plan    PT Assessment Patient needs continued PT services  PT Problem List Decreased range of motion;Decreased strength;Decreased activity tolerance;Decreased balance;Decreased mobility;Decreased safety awareness;Decreased knowledge of use of DME;Cardiopulmonary status limiting activity;Pain       PT Treatment Interventions Gait training;Stair training;DME instruction;Functional mobility training;Therapeutic activities;Therapeutic exercise;Balance training;Neuromuscular re-education;Wheelchair mobility training;Cognitive remediation;Patient/family education    PT Goals (Current goals can be found in the Care Plan section)   Acute Rehab PT Goals Patient Stated Goal: control pain PT Goal Formulation: With patient Time For Goal Achievement: 12/04/23 Potential to Achieve Goals: Fair    Frequency Min 2X/week     Co-evaluation               AM-PAC PT "6 Clicks" Mobility  Outcome Measure Help needed turning from your back to your side while in a flat bed without using bedrails?: A Little Help needed moving from lying on your back to sitting on the side of a flat bed without using bedrails?: A Little Help needed moving to and from a bed to a chair (including a wheelchair)?: A Lot Help needed standing up from a chair using your arms (e.g., wheelchair or bedside chair)?: A Lot Help needed to walk in hospital room?: Total Help needed climbing 3-5 steps with a railing? : Total 6 Click Score: 12    End of Session Equipment Utilized During Treatment: Oxygen Activity Tolerance: Patient tolerated treatment well Patient left: in bed;with call bell/phone within reach;with nursing/sitter in room Nurse Communication: Mobility status PT Visit Diagnosis: Muscle weakness (generalized) (M62.81);Difficulty in walking, not elsewhere classified (R26.2);Pain Pain - Right/Left: Right Pain - part of body:  (  ribs)    Time: 2725-3664 PT Time Calculation (min) (ACUTE ONLY): 33 min   Charges:   PT Evaluation $PT Eval Moderate Complexity: 1 Mod PT Treatments $Therapeutic Activity: 8-22 mins PT General Charges $$ ACUTE PT VISIT: 1 Visit         Malachi Pro, DPT 11/21/2023, 4:40 PM

## 2023-11-22 ENCOUNTER — Inpatient Hospital Stay

## 2023-11-22 ENCOUNTER — Inpatient Hospital Stay
Admission: RE | Admit: 2023-11-22 | Discharge: 2023-11-22 | Disposition: A | Discharge status: Home / Self Care | Source: Home / Self Care | Attending: Cardiovascular Disease | Admitting: Cardiovascular Disease

## 2023-11-22 DIAGNOSIS — I480 Paroxysmal atrial fibrillation: Secondary | ICD-10-CM

## 2023-11-22 DIAGNOSIS — I350 Nonrheumatic aortic (valve) stenosis: Secondary | ICD-10-CM | POA: Diagnosis not present

## 2023-11-22 DIAGNOSIS — D649 Anemia, unspecified: Secondary | ICD-10-CM | POA: Diagnosis not present

## 2023-11-22 DIAGNOSIS — I517 Cardiomegaly: Secondary | ICD-10-CM | POA: Diagnosis not present

## 2023-11-22 DIAGNOSIS — Z4682 Encounter for fitting and adjustment of non-vascular catheter: Secondary | ICD-10-CM | POA: Diagnosis not present

## 2023-11-22 DIAGNOSIS — J942 Hemothorax: Secondary | ICD-10-CM | POA: Diagnosis not present

## 2023-11-22 DIAGNOSIS — N179 Acute kidney failure, unspecified: Secondary | ICD-10-CM | POA: Diagnosis not present

## 2023-11-22 DIAGNOSIS — J9601 Acute respiratory failure with hypoxia: Secondary | ICD-10-CM | POA: Diagnosis not present

## 2023-11-22 DIAGNOSIS — I5032 Chronic diastolic (congestive) heart failure: Secondary | ICD-10-CM | POA: Diagnosis not present

## 2023-11-22 DIAGNOSIS — J9811 Atelectasis: Secondary | ICD-10-CM | POA: Diagnosis not present

## 2023-11-22 DIAGNOSIS — J90 Pleural effusion, not elsewhere classified: Secondary | ICD-10-CM | POA: Diagnosis not present

## 2023-11-22 DIAGNOSIS — I2510 Atherosclerotic heart disease of native coronary artery without angina pectoris: Secondary | ICD-10-CM | POA: Diagnosis not present

## 2023-11-22 LAB — GLUCOSE, CAPILLARY
Glucose-Capillary: 138 mg/dL — ABNORMAL HIGH (ref 70–99)
Glucose-Capillary: 144 mg/dL — ABNORMAL HIGH (ref 70–99)
Glucose-Capillary: 144 mg/dL — ABNORMAL HIGH (ref 70–99)
Glucose-Capillary: 149 mg/dL — ABNORMAL HIGH (ref 70–99)
Glucose-Capillary: 239 mg/dL — ABNORMAL HIGH (ref 70–99)

## 2023-11-22 LAB — MAGNESIUM
Magnesium: 3 mg/dL — ABNORMAL HIGH (ref 1.7–2.4)
Magnesium: 3 mg/dL — ABNORMAL HIGH (ref 1.7–2.4)

## 2023-11-22 LAB — ECHOCARDIOGRAM COMPLETE
AR max vel: 1.71 cm2
AV Area VTI: 1.67 cm2
AV Area mean vel: 1.51 cm2
AV Mean grad: 16 mmHg
AV Peak grad: 30.1 mmHg
Ao pk vel: 2.75 m/s
Area-P 1/2: 2.7 cm2
Calc EF: 53.7 %
Height: 70 in
MV VTI: 3.43 cm2
P 1/2 time: 1093 ms
S' Lateral: 3.7 cm
Single Plane A2C EF: 55.6 %
Single Plane A4C EF: 50.2 %
Weight: 3587.33 [oz_av]

## 2023-11-22 LAB — TROPONIN I (HIGH SENSITIVITY)
Troponin I (High Sensitivity): 13 ng/L (ref <18)
Troponin I (High Sensitivity): 14 ng/L (ref <18)
Troponin I (High Sensitivity): 14 ng/L (ref <18)

## 2023-11-22 LAB — RENAL FUNCTION PANEL
Albumin: 2.1 g/dL — ABNORMAL LOW (ref 3.5–5.0)
Anion gap: 9 (ref 5–15)
BUN: 62 mg/dL — ABNORMAL HIGH (ref 8–23)
CO2: 31 mmol/L (ref 22–32)
Calcium: 7.7 mg/dL — ABNORMAL LOW (ref 8.9–10.3)
Chloride: 95 mmol/L — ABNORMAL LOW (ref 98–111)
Creatinine, Ser: 1.27 mg/dL — ABNORMAL HIGH (ref 0.61–1.24)
GFR, Estimated: 57 mL/min — ABNORMAL LOW (ref >60)
Glucose, Bld: 145 mg/dL — ABNORMAL HIGH (ref 70–99)
Phosphorus: 4.2 mg/dL (ref 2.5–4.6)
Potassium: 4.4 mmol/L (ref 3.5–5.1)
Sodium: 135 mmol/L (ref 135–145)

## 2023-11-22 LAB — CBC
HCT: 27.2 % — ABNORMAL LOW (ref 39.0–52.0)
Hemoglobin: 8.3 g/dL — ABNORMAL LOW (ref 13.0–17.0)
MCH: 26.3 pg (ref 26.0–34.0)
MCHC: 30.5 g/dL (ref 30.0–36.0)
MCV: 86.1 fL (ref 80.0–100.0)
Platelets: 379 10*3/uL (ref 150–400)
RBC: 3.16 MIL/uL — ABNORMAL LOW (ref 4.22–5.81)
RDW: 17.5 % — ABNORMAL HIGH (ref 11.5–15.5)
WBC: 12.1 10*3/uL — ABNORMAL HIGH (ref 4.0–10.5)
nRBC: 0 % (ref 0.0–0.2)

## 2023-11-22 LAB — POTASSIUM: Potassium: 4.3 mmol/L (ref 3.5–5.1)

## 2023-11-22 MED ORDER — SODIUM CHLORIDE (PF) 0.9 % IJ SOLN
10.0000 mg | Freq: Once | INTRAMUSCULAR | Status: AC
  Administered 2023-11-22: 10 mg via INTRAPLEURAL
  Filled 2023-11-22: qty 10

## 2023-11-22 NOTE — Progress Notes (Signed)
*  PRELIMINARY RESULTS* Echocardiogram 2D Echocardiogram has been performed.  Carolyne Fiscal 11/22/2023, 12:57 PM

## 2023-11-22 NOTE — Progress Notes (Signed)
 PHARMACY CONSULT NOTE - FOLLOW UP  Pharmacy Consult for Electrolyte Monitoring and Replacement   Recent Labs: Potassium (mmol/L)  Date Value  11/22/2023 4.4  02/01/2010 4.5   Magnesium (mg/dL)  Date Value  54/05/8118 3.0 (H)   Calcium (mg/dL)  Date Value  14/78/2956 7.7 (L)  02/01/2010 9.2   Albumin (g/dL)  Date Value  21/30/8657 2.1 (L)  09/17/2023 3.6 (L)   Phosphorus (mg/dL)  Date Value  84/69/6295 4.2   Sodium (mmol/L)  Date Value  11/22/2023 135  10/03/2023 141   Assessment: [name] Young is a 83 yo male who presented for surgery with a history significant for frustration due to recurrent shortness of breath. They have a history of fatigue and exertional dyspnea with minimal activity that improved following thoracentesis in January/Feburary. At a previous appointment, they asked for another thoracentesis to provide relief and asked about a more permanent solution like Pleurx catheter to help relieve their history of SOB. Pharmacy was consulted to manage this patient's electrolytes while they're in the ICU.   Goal of Therapy:  Electrolytes WNL  Plan:  No replacement indicated at this time Continue to follow electrolytes with AM labs  Lowella Bandy, PharmD 11/22/2023 7:08 AM

## 2023-11-22 NOTE — Progress Notes (Signed)
 Rounding Note    Patient Name: [name] Young. Date of Encounter: 11/22/2023  [facility] Cardiologist: Yvonne Kendall, MD   Subjective   Reports feeling little bit better compared to yesterday Still with significant right chest pain, hurts to cough Chest tube remains in place Plan for additional tPA Chest x-ray today stable right-sided chest tube with grossly stable right pleural effusion  Inpatient Medications    Scheduled Meds:  sodium chloride   Intravenous Once   acetaminophen  650 mg Oral Q6H   Chlorhexidine Gluconate Cloth  6 each Topical Daily   insulin aspart  0-15 Units Subcutaneous TID WC   insulin aspart  0-5 Units Subcutaneous QHS   magnesium oxide  200 mg Oral Daily   metoprolol tartrate  25 mg Oral BID   mirtazapine  15 mg Oral QHS   rosuvastatin  10 mg Oral Daily   Continuous Infusions:  PRN Meds: docusate sodium, fentaNYL (SUBLIMAZE) injection, nitroGLYCERIN, mouth rinse, oxyCODONE, polyethylene glycol   Vital Signs    Vitals:   11/22/23 1000 11/22/23 1100 11/22/23 1148 11/22/23 1200  BP: 114/63 101/60  112/63  Pulse: 93 92  89  Resp: (!) 24 19  (!) 32  Temp:   98.3 F (36.8 C)   TempSrc:   Oral   SpO2: 93% 94%  96%  Weight:      Height:        Intake/Output Summary (Last 24 hours) at 11/22/2023 1330 Last data filed at 11/22/2023 1150 Gross per 24 hour  Intake 50 ml  Output 2526 ml  Net -2476 ml      11/22/2023    3:47 AM 11/21/2023    5:00 AM 11/20/2023    9:37 AM  Last 3 Weights  Weight (lbs) 224 lb 3.3 oz 234 lb 234 lb  Weight (kg) 101.7 kg 106.142 kg 106.142 kg      Telemetry    Normal sinus rhythm- Personally Reviewed  ECG     - Personally Reviewed  Physical Exam   GEN: No acute distress.   Neck: No JVD Cardiac: RRR, no murmurs, rubs, or gallops.  Respiratory: Clear to auscultation bilaterally. GI: Soft, nontender, non-distended  MS: No edema; No deformity. Neuro:  Nonfocal  Psych: Normal affect    Labs    High Sensitivity Troponin:  No results for input(s): "TROPONINIHS" in the last 720 hours.   Chemistry Recent Labs  Lab 11/20/23 1220 11/21/23 0239 11/22/23 0330  NA 133* 134* 135  K 4.6 4.2 4.4  CL 91* 94* 95*  CO2 32 31 31  GLUCOSE 202* 150* 145*  BUN 75* 67* 62*  CREATININE 1.71* 1.45* 1.27*  CALCIUM 8.1* 7.8* 7.7*  MG  --  3.1* 3.0*  PROT 7.0  --   --   ALBUMIN 2.4* 2.2* 2.1*  AST 37  --   --   ALT 38  --   --   ALKPHOS 68  --   --   BILITOT 0.6  --   --   GFRNONAA 40* 48* 57*  ANIONGAP 10 9 9     Lipids No results for input(s): "CHOL", "TRIG", "HDL", "LABVLDL", "LDLCALC", "CHOLHDL" in the last 168 hours.  Hematology Recent Labs  Lab 11/21/23 1326 11/21/23 1740 11/22/23 0330  WBC 12.7* 12.9* 12.1*  RBC 3.16* 3.15* 3.16*  HGB 8.4* 8.3* 8.3*  HCT 27.2* 27.7* 27.2*  MCV 86.1 87.9 86.1  MCH 26.6 26.3 26.3  MCHC 30.9 30.0 30.5  RDW 17.6*  17.2* 17.5*  PLT 336 378 379   Thyroid No results for input(s): "TSH", "FREET4" in the last 168 hours.  BNP Recent Labs  Lab 11/20/23 1220  BNP 188.4*    DDimer No results for input(s): "DDIMER" in the last 168 hours.   Radiology    ECHOCARDIOGRAM COMPLETE Result Date: 11/22/2023    ECHOCARDIOGRAM REPORT   Patient Name:   [name] Young. Date of Exam: 11/22/2023 Medical Rec #:  [id number]            Height:       70.0 in Accession #:    [id number]           Weight:       224.2 lb Date of Birth:  31-May-1941           BSA:          2.191 m Patient Age:    82 years             BP:           114/63 mmHg Patient Gender: M                    HR:           87 bpm. Exam Location:  [facility] Procedure: 2D Echo, Cardiac Doppler and Color Doppler (Both Spectral and Color            Flow Doppler were utilized during procedure). Indications:     CHF  History:         Patient has prior history of Echocardiogram examinations, most                  recent 12/30/2022. CHF, CAD, Prior CABG, Arrythmias:PVC, Atrial                   Fibrillation and Bradycardia, Signs/Symptoms:Bacteremia and                  Shortness of Breath; Risk Factors:Diabetes and Dyslipidemia.                  CKD, MV endocarditis.  Sonographer:     Mikki Harbor Referring Phys:  [id number] Antonieta Iba Diagnosing Phys: Julien Nordmann MD  Sonographer Comments: Technically difficult study due to poor echo windows and no subcostal window. Image acquisition challenging due to respiratory motion. IMPRESSIONS  1. Left ventricular ejection fraction, by estimation, is 40 to 45%. Left ventricular ejection fraction by 2D MOD biplane is 53.7 %. The left ventricle has mildly decreased function. The left ventricle demonstrates global hypokinesis. There is mild left ventricular hypertrophy. Left ventricular diastolic parameters are consistent with Grade I diastolic dysfunction (impaired relaxation).  2. Right ventricular systolic function is normal. The right ventricular size is normal. There is normal pulmonary artery systolic pressure. The estimated right ventricular systolic pressure is 14.3 mmHg.  3. The mitral valve is normal in structure. Mild mitral valve regurgitation. No evidence of mitral stenosis.  4. The aortic valve is normal in structure. There is moderate calcification of the aortic valve. Aortic valve regurgitation is mild. Mild to moderate aortic valve stenosis. Aortic valve mean gradient measures 16.0 mmHg.  5. There is mild dilatation of the aortic root, measuring 43 mm.  6. The inferior vena cava is normal in size with greater than 50% respiratory variability, suggesting right atrial pressure of 3 mmHg. FINDINGS  Left Ventricle: Left ventricular ejection fraction, by estimation, is 40 to 45%.  Left ventricular ejection fraction by 2D MOD biplane is 53.7 %. The left ventricle has mildly decreased function. The left ventricle demonstrates global hypokinesis. Strain  was performed and the global longitudinal strain is indeterminate. The left ventricular internal  cavity size was normal in size. There is mild left ventricular hypertrophy. Left ventricular diastolic parameters are consistent with Grade I diastolic dysfunction (impaired relaxation). Right Ventricle: The right ventricular size is normal. No increase in right ventricular wall thickness. Right ventricular systolic function is normal. There is normal pulmonary artery systolic pressure. The tricuspid regurgitant velocity is 1.68 m/s, and  with an assumed right atrial pressure of 3 mmHg, the estimated right ventricular systolic pressure is 14.3 mmHg. Left Atrium: Left atrial size was normal in size. Right Atrium: Right atrial size was normal in size. Pericardium: There is no evidence of pericardial effusion. Mitral Valve: The mitral valve is normal in structure. There is mild calcification of the mitral valve leaflet(s). Mild to moderate mitral annular calcification. Mild mitral valve regurgitation. No evidence of mitral valve stenosis. MV peak gradient, 6.9  mmHg. The mean mitral valve gradient is 2.0 mmHg. Tricuspid Valve: The tricuspid valve is normal in structure. Tricuspid valve regurgitation is not demonstrated. No evidence of tricuspid stenosis. Aortic Valve: The aortic valve is normal in structure. There is moderate calcification of the aortic valve. Aortic valve regurgitation is mild. Aortic regurgitation PHT measures 1093 msec. Mild to moderate aortic stenosis is present. Aortic valve mean gradient measures 16.0 mmHg. Aortic valve peak gradient measures 30.1 mmHg. Aortic valve area, by VTI measures 1.67 cm. Pulmonic Valve: The pulmonic valve was normal in structure. Pulmonic valve regurgitation is not visualized. No evidence of pulmonic stenosis. Aorta: The aortic root is normal in size and structure. There is mild dilatation of the aortic root, measuring 43 mm. Venous: The inferior vena cava is normal in size with greater than 50% respiratory variability, suggesting right atrial pressure of 3 mmHg.  IAS/Shunts: No atrial level shunt detected by color flow Doppler. Additional Comments: 3D was performed not requiring image post processing on an independent workstation and was indeterminate.  LEFT VENTRICLE PLAX 2D                        Biplane EF (MOD) LVIDd:         5.30 cm         LV Biplane EF:   Left LVIDs:         3.70 cm                          ventricular LV PW:         1.60 cm                          ejection LV IVS:        1.50 cm                          fraction by LVOT diam:     2.30 cm                          2D MOD LV SV:         89  biplane is LV SV Index:   41                               53.7 %. LVOT Area:     4.15 cm                                Diastology                                LV e' medial:    8.05 cm/s LV Volumes (MOD)               LV E/e' medial:  6.2 LV vol d, MOD    79.0 ml       LV e' lateral:   8.05 cm/s A2C:                           LV E/e' lateral: 6.2 LV vol d, MOD    107.0 ml A4C: LV vol s, MOD    35.1 ml A2C: LV vol s, MOD    53.3 ml A4C: LV SV MOD A2C:   43.9 ml LV SV MOD A4C:   107.0 ml LV SV MOD BP:    54.9 ml RIGHT VENTRICLE RV Basal diam:  3.60 cm RV Mid diam:    3.10 cm RV S prime:     14.35 cm/s LEFT ATRIUM             Index        RIGHT ATRIUM           Index LA diam:        3.60 cm 1.64 cm/m   RA Area:     19.40 cm LA Vol (A2C):   67.7 ml 30.90 ml/m  RA Volume:   48.30 ml  22.04 ml/m LA Vol (A4C):   42.8 ml 19.53 ml/m LA Biplane Vol: 58.4 ml 26.65 ml/m  AORTIC VALVE                     PULMONIC VALVE AV Area (Vmax):    1.71 cm      PV Vmax:       1.46 m/s AV Area (Vmean):   1.51 cm      PV Peak grad:  8.5 mmHg AV Area (VTI):     1.67 cm AV Vmax:           274.50 cm/s AV Vmean:          183.500 cm/s AV VTI:            0.531 m AV Peak Grad:      30.1 mmHg AV Mean Grad:      16.0 mmHg LVOT Vmax:         113.00 cm/s LVOT Vmean:        66.850 cm/s LVOT VTI:          0.214 m LVOT/AV VTI ratio: 0.40 AI PHT:            1093  msec  AORTA Ao Root diam: 4.30 cm Ao Asc diam:  3.70 cm MITRAL VALVE                TRICUSPID VALVE MV Area (PHT): 2.70 cm  TR Peak grad:   11.3 mmHg MV Area VTI:   3.43 cm     TR Vmax:        168.00 cm/s MV Peak grad:  6.9 mmHg MV Mean grad:  2.0 mmHg     SHUNTS MV Vmax:       1.31 m/s     Systemic VTI:  0.21 m MV Vmean:      66.1 cm/s    Systemic Diam: 2.30 cm MV Decel Time: 281 msec MV E velocity: 50.30 cm/s MV A velocity: 112.00 cm/s MV E/A ratio:  0.45 Julien Nordmann MD Electronically signed by Julien Nordmann MD Signature Date/Time: 11/22/2023/1:19:27 PM    Final    DG Chest 1 View Result Date: 11/22/2023 CLINICAL DATA:  Chest tube in place. EXAM: CHEST  1 VIEW COMPARISON:  November 21, 2023. FINDINGS: Stable cardiomegaly. Sternotomy wires are noted. Right-sided chest tube is noted with grossly stable right pleural effusion and associated atelectasis. Minimal left basilar subsegmental atelectasis is noted. Bony thorax is unremarkable. IMPRESSION: Stable right-sided chest tube with grossly stable right pleural effusion and associated atelectasis. Electronically Signed   By: Lupita Raider M.D.   On: 11/22/2023 09:38   DG Chest Port 1 View Result Date: 11/21/2023 CLINICAL DATA:  Dyspnea EXAM: PORTABLE CHEST 1 VIEW COMPARISON:  11/21/2023 FINDINGS: Right basilar pigtail chest tube has migrated inferiorly. Small to moderate right pleural effusion persists with associated right basilar atelectasis. Small left pleural effusion is unchanged. Mild left basilar atelectasis. Coronary artery bypass grafting has been performed. Cardiac size is mildly enlarged, unchanged. No pneumothorax. IMPRESSION: 1. Interval inferior migration of the right basilar pigtail chest tube. Persistent small to moderate right pleural effusion. 2. Stable small left pleural effusion. Electronically Signed   By: Helyn Numbers M.D.   On: 11/21/2023 19:58   DG Chest Port 1 View Result Date: 11/21/2023 CLINICAL DATA:  142230 Pleural  effusion [id number] EXAM: PORTABLE CHEST 1 VIEW COMPARISON:  11/20/2023 FINDINGS: Right chest tube remains in place. Stable heart size status post sternotomy and CABG. Aortic atherosclerosis. Moderate right pleural effusion with a small amount of fissural fluid, stable. Similar hazy right basilar opacity. Left lung appears clear. No pneumothorax. IMPRESSION: Moderate right pleural effusion, stable. Right chest tube remains in place. Electronically Signed   By: Duanne Guess D.O.   On: 11/21/2023 12:02   DG Chest Port 1 View Result Date: 11/20/2023 CLINICAL DATA:  Chest tube in place. EXAM: PORTABLE CHEST 1 VIEW COMPARISON:  Radiograph earlier today FINDINGS: Right pigtail catheter is coiled in the lateral mid hemithorax. Decreased right pleural effusion. Small pleural fluid persists with fluid tracking in the fissure. Mild ill-defined opacity in the right lung base. No visible pneumothorax. No focal left lung opacity. IMPRESSION: 1. Right pigtail catheter with decreased right pleural effusion. Small pleural fluid persists with fluid tracking in the fissure. 2. Mild ill-defined opacity in the right lung base, likely atelectasis. Electronically Signed   By: Narda Rutherford M.D.   On: 11/20/2023 22:42   IR PERC PLEURAL DRAIN W/INDWELL CATH W/IMG GUIDE Result Date: 11/20/2023 INDICATION: 83 year old male with a history anticoagulation and prior thoracentesis, presents with new onset right hemothorax referred for thoracostomy tube EXAM: IMAGE GUIDED PLACEMENT OF RIGHT-SIDED THORACOSTOMY TUBE MEDICATIONS: None. ANESTHESIA/SEDATION: Moderate (conscious) sedation was not employed during this procedure. A total of Versed 0 mg and Fentanyl 25 mcg was administered intravenously by the radiology nurse. Total intra-service moderate Sedation Time: 0 minutes. The patient's level of consciousness  and vital signs were monitored continuously by radiology nursing throughout the procedure under my direct supervision.  COMPLICATIONS: None PROCEDURE: The procedure, risks, benefits, and alternatives were explained to the patient/patient's family, who provided informed consent on the patient's behalf. Specific risks that were addressed included bleeding, infection, ongoing pneumothorax, need for further procedure/surgery, chance of hemorrhage, hemoptysis, cardiopulmonary collapse, death. Questions regarding the procedure were encouraged and answered. The patient understands and consents to the procedure. Patient was positioned in the right anterior oblique position on the IR table and scout image of the chest was performed for planning purposes. The right mid axillary line at the level of the nipple was identified, and prepped and draped in the usual sterile fashion. The skin and subcutaneous tissues were generously infiltrated 1% lidocaine for local anesthesia. A Yueh needle was then used to enter the pleural space with aspiration of hemothorax. The plastic Yueh catheter was advanced into the pleural space and an 035 guidewire was advanced under fluoroscopy. Dilation of the skin tract was performed over the wire, and then modified Seldinger technique was used to place a 16 French pigtail catheter Catheter was attached to water seal chamber and suction was applied confirming a operational chest tube. A proximally 1700 cc of hemothorax of evacuated. A new pleurevac chamber was attached 4:26pm. Retention suture was placed.  Sterile dressing was placed. Patient tolerated the procedure well and remained hemodynamically stable throughout. No complications were encountered and no significant blood loss was encounter IMPRESSION: Status post right-sided thoracostomy tube for hemothorax. Signed, Yvone Neu. Miachel Roux, RPVI [facility] Electronically Signed   By: Gilmer Mor D.O.   On: 11/20/2023 16:45   CT CHEST WO CONTRAST Result Date: 11/20/2023 CLINICAL DATA:  Shortness of  breath.  Recent thoracentesis. EXAM: CT CHEST WITHOUT CONTRAST TECHNIQUE: Multidetector CT imaging of the chest was performed following the standard protocol without IV contrast. RADIATION DOSE REDUCTION: This exam was performed according to the departmental dose-optimization program which includes automated exposure control, adjustment of the mA and/or kV according to patient size and/or use of iterative reconstruction technique. COMPARISON:  Radiograph earlier today. Radiograph 11/16/2023. Most recent CT 05/16/2023 FINDINGS: Cardiovascular: The heart is mildly enlarged. Coronary artery calcifications post CABG. Calcifications of the aortic valve upper normal ascending aortic caliber 3.9 cm. Moderate aortic atherosclerosis. No pericardial effusion. Mediastinum/Nodes: No enlarged mediastinal lymph nodes. Hilar assessment is limited on this unenhanced exam. The esophagus is mildly patulous, no wall thickening. Lungs/Pleura: Large right pleural effusion is partially loculated. It is difficult to delineate compressed lung in the right lower lobe from complex fluid, however suspect heterogeneous increased density within the right pleural effusion dependently and laterally. Complete consolidation throughout the right lower lobe with subtotal consolidation in the dependent right upper and right middle lobes. It is unclear if this represents compressive atelectasis or infection. Cannot exclude underlying neoplasm. Subsegmental linear atelectasis in the left upper lobe. The left lung is otherwise clear. There is no left pleural effusion. Retained mucus within the trachea. Upper Abdomen: There is fluid posterior to the right lobe of the liver with area of decreased density in the posterior right lobe, series 3, image 158. This has increased from September CT. Musculoskeletal: Prior median sternotomy. Degenerative change throughout the thoracic spine. No acute osseous findings or bony destructive change. IMPRESSION: 1. Large  right pleural effusion, partially loculated. It is difficult to delineate compressed lung in the right lower lobe from complex fluid, however suspect heterogeneous increased  density within the right pleural effusion dependently and laterally. This may represent blood products or infection. Consider fluid sampling or contrast-enhanced exam for more more detailed assessment 2. Complete consolidation throughout the right lower lobe with subtotal consolidation in the dependent right upper and right middle lobes. It is unclear if this represents compressive atelectasis or infection. Cannot exclude underlying neoplasm. 3. Fluid posterior to the right lobe of the liver with area of decreased density in the posterior right lobe. This has increased from September CT. Etiology is indeterminate. Abdominal CT (with IV contrast in the absence of contraindication) would be helpful for more detailed assessment. 4. Retained mucus within the trachea. Aortic Atherosclerosis (ICD10-I70.0). Electronically Signed   By: Narda Rutherford M.D.   On: 11/20/2023 15:25    Cardiac Studies   Echo  1. Left ventricular ejection fraction, by estimation, is 40 to 45%. Left  ventricular ejection fraction by 2D MOD biplane is 53.7 %. The left  ventricle has mildly decreased function. The left ventricle demonstrates  global hypokinesis. There is mild left  ventricular hypertrophy. Left ventricular diastolic parameters are  consistent with Grade I diastolic dysfunction (impaired relaxation).   2. Right ventricular systolic function is normal. The right ventricular  size is normal. There is normal pulmonary artery systolic pressure. The  estimated right ventricular systolic pressure is 14.3 mmHg.   3. The mitral valve is normal in structure. Mild mitral valve  regurgitation. No evidence of mitral stenosis.   4. The aortic valve is normal in structure. There is moderate  calcification of the aortic valve. Aortic valve regurgitation is  mild.  Mild to moderate aortic valve stenosis. Aortic valve mean gradient  measures 16.0 mmHg.   5. There is mild dilatation of the aortic root, measuring 43 mm.   6. The inferior vena cava is normal in size with greater than 50%  respiratory variability, suggesting right atrial pressure of 3 mmHg.   Patient Profile     83 y.o. male with a history of three vessel CAD s/p CABG (10/2017) complicated by postoperative afib, ascending cholangitis, complicated by recurrent afib, heart failure with recovered EF (24-45%->50-55%), and mitral valve endocarditis, as well as hypertension, hyperlipidemia, T2DM, hepatic abscess, and CKD III who presented to [facility] for outpatient cardiac catheterization but was found to have large right hemothorax.   Assessment & Plan    Hemothorax Long history recurrent effusion on right status post thoracentesis last week Presenting for left heart catheterization, procedure canceled secondary to recurrent large right hemothorax with drop in hemoglobin 3 points Status post RBC transfusion, hemoglobin 8.1 Chest tube remains in place on right Chest x-ray with residual pleural effusion Notes indicating repeat tPA given Hemoglobin stable   Coronary artery disease Seen in cardiology clinic as outpatient November 20, 2023 Right and left heart catheterization for shortness of breath was aborted given findings as above Denies anginal symptoms   Nonsustained VT On initial admission, frequent episodes nonsustained VT on telemetry likely in the setting of anemia, hemothorax Metoprolol tartrate increased up to 25 twice daily -Ectopy improving, still with PVCs, 2 3 beat runs of VT   Paroxysmal atrial fibrillation Since being placed on telemetry has been in normal sinus rhythm with PACs, PVCs Currently not on anticoagulation, Eliquis on hold given hemothorax Continue metoprolol tartrate 25 twice daily   Chronic diastolic CHF As outpatient on Lasix, metolazone Appears relatively  euvolemic Echocardiogram April 2024 with EF 50 to 55%, no indication of elevated right heart pressures Diuretics on hold  Aortic valve stenosis Mean gradient 16 mmHg, mild to moderate aortic valve stenosis, no significant progression compared to April 2024   Acute on chronic respiratory failure with hypoxia Secondary to hemothorax, complete collapse of right lung Status post chest tube, still with  residual right pleural effusion Repeat tPA given  [facility] will sign off.   Medication Recommendations: No changes Other recommendations (labs, testing, etc): No further testing Follow up as an outpatient: Work with Dr.End in cardiology clinic   For questions or updates, please contact [facility] Please consult [email or web address] for contact info under        Signed, Julien Nordmann, MD  11/22/2023, 1:30 PM

## 2023-11-22 NOTE — Progress Notes (Signed)
 NAME:  Kashawn Dirr., MRN:  [id number], DOB:  [birth year], LOS: 2 ADMISSION DATE:  11/20/2023  History of Present Illness:  Mr. Zentner is a pleasant 83 year old male patient with a past medical history of recurrent pleural effusion status post multiple thoracentesis thought to be transudate presenting to [facility] for an outpatient left heart cath and right heart cath.  He was found to have a complete whiteout of the right hemithorax suspicious for a hemothorax.  Patient underwent thoracentesis on 03/07 and drained 800 cc of serous fluid.  And since then he has been complaining of sharp right-sided chest pain associated with worsening shortness of breath.  He presents today for a left heart cath and was noted on fluoroscopy to have a near complete whiteout of the right hemithorax.  Therefore left heart cath was canceled.  Labs were noticeable for drop in hemoglobin from 10 g/dL at baseline to 7.1 g/dL at baseline.  A CT scan without contrast of the chest was obtained which showed a large right pleural effusion with heterogeneous densities highly suspicious for a hemothorax.  IR graciously placed a right 97 [place] skater with 1.8 L of serosanguineous fluid drained.  He remained hemodynamically throughout procedure.  Pertinent  Medical History  As above  Significant Hospital Events: Including procedures, antibiotic start and stop dates in addition to other pertinent events   11/20/2023 Admitted to the ICU s/p right 16 Fr Skater. 1.8L Serosanguinous fluid drained.  11/21/23: Tpa 10mg  Given intrapleural, output was 1800cc yesterday of sanguinous material. CXR appeared improved.   Subjective:  Reports breathing has improved. Has no major complaints.  CXR this AM with persistent RLL moderate effusion but improved since prior.  Chest tube flushed this am. And proceeding with another dose of tpa dnase.   Objective   Blood pressure 112/63, pulse 89, temperature 98.3 F (36.8 C), temperature  source Oral, resp. rate (!) 32, height 5\' 10"  (1.778 m), weight 101.7 kg, SpO2 96%.        Intake/Output Summary (Last 24 hours) at 11/22/2023 1229 Last data filed at 11/22/2023 1150 Gross per 24 hour  Intake 50 ml  Output 2526 ml  Net -2476 ml   Filed Weights   11/20/23 0937 11/21/23 0500 11/22/23 0347  Weight: 106.1 kg 106.1 kg 101.7 kg    Examination: General: Alert awake  and oriented. HD stable.  HENT: Supple neck reactive pupils Lungs: Improved breath sounds over the left hemithorax.  Cardiovascular: Normal S1, Normal S2, RRR, no murmurs or ES appreciated Abdomen: Soft, non tender, non distended, +BS  Extremities: Warm and well perfused.   Labs and imaging were reviewed.   Assessment & Plan:  Mr. Philbrook is a pleasant 83 year old male patient with a past medical history of recurrent pleural effusion status post multiple thoracentesis thought to be transudate presenting to [facility] for an outpatient left heart cath and right heart cath.  He was found to have a complete whiteout of the right hemithorax suspicious for a hemothorax.  #Acute hypoxic respiratory failure secondary to...  #Large right hemothorax in the setting of recent thoracentesis on 11/16/2023 while being on Eliquis. S/p 16Fr Skater placement on 03/11. Appears to be venous bleed given hemodynamic stability and lack of ongoing pleural drainage.  #AKI like prerenal in the setting acute anemia  #Acute Anemia in the setting of the above.  #Paroxysmal A.fib on eliquis.   []  Proceeding with 2nd dose tpa today, monitor output. []  CT to suction at -20cmH2O.  []  Should  he show any worsening output, hemodynamic instability or drop in hemogloibin, CTA chest should be obtained and IR should be contacted for possible embolization. If that is not feasible than transfer to [facility] after stabilization for thoracic surgery eval should be pursued.  []  CBC this pm.   Pulmonary will follow closely.  Please do not hesitate to reach out  with any concerns.   I spent 50 minutes caring for this patient today, including preparing to see the patient, obtaining a medical history , reviewing a separately obtained history, performing a medically appropriate examination and/or evaluation, counseling and educating the patient/family/caregiver, ordering medications, tests, or procedures, documenting clinical information in the electronic health record, and independently interpreting results (not separately reported/billed) and communicating results to the patient/family/caregiver  Janann Colonel, MD [facility] 11/22/2023 12:33 PM

## 2023-11-22 NOTE — Progress Notes (Signed)
 CCMD called RN to inform patient in vent bigeminy for about 20 minutes. NP made aware by RN and plan to reorder electrolytes. MD [facility] sent message by RN for update.

## 2023-11-22 NOTE — Progress Notes (Signed)
 PT Cancellation Note  Patient Details Name: [name] Young. MRN: [id number] DOB: 1941-08-02   Cancelled Treatment:    Reason Eval/Treat Not Completed: Medical issues which prohibited therapy Per conversation with RN, pt has continued to have a lot of pain, has been drowsy and heart rhythm has been variable.  Not a good candidate for PT this date.     Malachi Pro, DPT 11/22/2023, 5:26 PM

## 2023-11-22 NOTE — Procedures (Signed)
 Pleural Fibrinolytic Administration Procedure Note  [name] Young  [id number]  12-Dec-1940  Date:11/22/23  Time:12:28 PM   Provider Performing:Jean-Pierre Ayvah Caroll   Procedure: Pleural Fibrinolysis Subsequent day [phone])  Indication(s) Fibrinolysis of complicated pleural effusion  Consent Risks of the procedure as well as the alternatives and risks of each were explained to the patient and/or caregiver.  Consent for the procedure was obtained.   Anesthesia None   Time Out Verified patient identification, verified procedure, site/side was marked, verified correct patient position, special equipment/implants available, medications/allergies/relevant history reviewed, required imaging and test results available.   Sterile Technique Hand hygiene, gloves   Procedure Description Existing pleural catheter was cleaned and accessed in sterile manner.  10mg  of tPA in 30cc of saline were injected into pleural space using existing pleural catheter.  Catheter will be clamped for 1 hour and then placed back to suction.  Complications/Tolerance None; patient tolerated the procedure well.  EBL None  Specimen(s) None   Janann Colonel, MD [facility] 11/22/2023 12:29 PM

## 2023-11-22 NOTE — Plan of Care (Signed)
  Problem: Education: Goal: Understanding of CV disease, CV risk reduction, and recovery process will improve Outcome: Progressing   Problem: Education: Goal: Knowledge of General Education information will improve Description: Including pain rating scale, medication(s)/side effects and non-pharmacologic comfort measures Outcome: Progressing   Problem: Clinical Measurements: Goal: Will remain free from infection Outcome: Progressing Goal: Diagnostic test results will improve Outcome: Progressing Goal: Respiratory complications will improve Outcome: Progressing   Problem: Activity: Goal: Risk for activity intolerance will decrease Outcome: Progressing   Problem: Pain Managment: Goal: General experience of comfort will improve and/or be controlled Outcome: Progressing   Problem: Skin Integrity: Goal: Risk for impaired skin integrity will decrease Outcome: Progressing

## 2023-11-22 NOTE — Progress Notes (Addendum)
 Progress Note    [name] Young.  QMV:[id number] DOB: [birth year]  DOA: 11/20/2023 PCP: [name], MD      Brief Narrative:    Medical records reviewed and are as summarized below:  [name] Young. is a 83 y.o. male  with medical history significant for chronic kidney disease stage III, BPH, HTN, DM2, paroxysmal atrial fibrillation (Eliquis), chronic diastolic heart failure with recurrent pleural effusions. He is familiar to IR from multiple thoracenteses with the last one on 11/16/23. 800 ml of yellow fluid was removed and post-procedure CXR was negative for a pneumothorax. Shortly after leaving the hospital he developed pleuritic right-sided chest pain that persisted.   He presented to the hospital on 11/20/2023 for right and left heart catheterization. Fluoroscopy of the right chest was performed before access was obtained and this showed a large right pleural effusion with near complete collapse of the right lung. The catheterization was cancelled and additional imaging obtained shows a hemothorax. His labs showed a 3 gm drop in hemoglobin.   He was admitted to the ICU for right hemothorax complicated by acute blood loss anemia.  Chest tube was placed in the right chest wall by interventional radiologist.  He was transfused 1 unit of PRBCs on 11/20/2023.  He was transferred from the ICU to [facility] on 11/22/2023.     Assessment/Plan:   Principal Problem:   Hemothorax Active Problems:   Aortic valve stenosis   Hemothorax on right    Body mass index is 32.17 kg/m.   Right hemothorax with complete collapse of right lung, recent right thoracentesis on 11/16/2023: S/p right chest tube placement with about 1.7 L of bloody output on 11/20/2023 by IR.   Analgesics as needed for pain.  He has been getting intrapleural tPA. Follow-up with pulmonologist for further recommendations.   Acute blood loss anemia: Hemoglobin up from 7.2-8.3.  H&H is  stable.  S/p transfusion 1 unit of PRBCs on 11/20/2023.   AKI: Improved.  Creatinine down from 1.71-1.27.   Acute hypoxic respiratory failure: Improved.  He is tolerating room air.   Paroxysmal atrial fibrillation, nonsustained VT and PVCs on telemetry: Continue metoprolol.  Eliquis has been held.   Chronic diastolic CHF, CAD, moderate aortic valve stenosis compensated. Lasix and metolazone on hold 2D echo in April 2024 showed EF estimated at 50 to 55%. Repeat 2D echo is pending.   General Weakness: PT and OT recommended SNF at discharge.  Diet Order             Diet heart healthy/carb modified Room service appropriate? Yes; Fluid consistency: Thin  Diet effective now                            Consultants: Interventional radiologist Cardiologist Pulmonologist  Procedures: Right chest tube placement on 11/20/2023    Medications:    sodium chloride   Intravenous Once   acetaminophen  650 mg Oral Q6H   Chlorhexidine Gluconate Cloth  6 each Topical Daily   insulin aspart  0-15 Units Subcutaneous TID WC   insulin aspart  0-5 Units Subcutaneous QHS   magnesium oxide  200 mg Oral Daily   metoprolol tartrate  25 mg Oral BID   mirtazapine  15 mg Oral QHS   rosuvastatin  10 mg Oral Daily   Continuous Infusions:   Anti-infectives (From admission, onward)    None  Family Communication/Anticipated D/C date and plan/Code Status   DVT prophylaxis: SCDs Start: 11/20/23 1353     Code Status: Full Code  Family Communication: Plan discussed with his wife at the bedside Disposition Plan: Plan to discharge to SNF   Status is: Inpatient Remains inpatient appropriate because: Right-sided hemithorax       Subjective:   Interval events noted.  He complains of pain in the right chest wall (at the chest tube site).  He feels better today.  Objective:    Vitals:   11/22/23 0415 11/22/23 0430 11/22/23 0757 11/22/23 1000  BP:  112/60 (!) 95/53  114/63  Pulse: 91 82  93  Resp: 18 16  (!) 24  Temp:   98.4 F (36.9 C)   TempSrc:   Axillary   SpO2: 91% 96%  93%  Weight:      Height:       No data found.   Intake/Output Summary (Last 24 hours) at 11/22/2023 1032 Last data filed at 11/22/2023 0800 Gross per 24 hour  Intake 10 ml  Output 2476 ml  Net -2466 ml   Filed Weights   11/20/23 0937 11/21/23 0500 11/22/23 0347  Weight: 106.1 kg 106.1 kg 101.7 kg    Exam:  GEN: NAD SKIN: Warm and dry EYES: No pallor or icterus ENT: MMM CV: RRR, systolic murmur loudest in aortic area PULM: Chest tube in right lateral chest wall, bibasilar rales ABD: soft, obese, NT, +BS CNS: AAO x 3, non focal EXT: No edema or tenderness       Data Reviewed:   I have personally reviewed following labs and imaging studies:  Labs: Labs show the following:   Basic Metabolic Panel: Recent Labs  Lab 11/20/23 1220 11/21/23 0239 11/22/23 0330  NA 133* 134* 135  K 4.6 4.2 4.4  CL 91* 94* 95*  CO2 32 31 31  GLUCOSE 202* 150* 145*  BUN 75* 67* 62*  CREATININE 1.71* 1.45* 1.27*  CALCIUM 8.1* 7.8* 7.7*  MG  --  3.1* 3.0*  PHOS  --  4.3 4.2   GFR Estimated Creatinine Clearance: 54.5 mL/min (A) (by C-G formula based on SCr of 1.27 mg/dL (H)). Liver Function Tests: Recent Labs  Lab 11/20/23 1220 11/21/23 0239 11/22/23 0330  AST 37  --   --   ALT 38  --   --   ALKPHOS 68  --   --   BILITOT 0.6  --   --   PROT 7.0  --   --   ALBUMIN 2.4* 2.2* 2.1*   No results for input(s): "LIPASE", "AMYLASE" in the last 168 hours. No results for input(s): "AMMONIA" in the last 168 hours. Coagulation profile Recent Labs  Lab 11/21/23 0902  INR 1.4*    CBC: Recent Labs  Lab 11/20/23 1220 11/20/23 1824 11/20/23 1926 11/21/23 0239 11/21/23 1326 11/21/23 1740 11/22/23 0330  WBC 13.5* 11.3*  --  11.8* 12.7* 12.9* 12.1*  NEUTROABS 11.4*  --   --  8.6*  --   --   --   HGB 7.2* 7.8* 7.7* 8.1* 8.4* 8.3* 8.3*  HCT  23.7* 25.3* 24.5* 25.9* 27.2* 27.7* 27.2*  MCV 86.8 86.6  --  86.3 86.1 87.9 86.1  PLT 363 332  --  345 336 378 379   Cardiac Enzymes: No results for input(s): "CKTOTAL", "CKMB", "CKMBINDEX", "TROPONINI" in the last 168 hours. BNP (last 3 results) Recent Labs    12/21/22 1455 04/25/23 1516  PROBNP 357.0* 142.0*  CBG: Recent Labs  Lab 11/21/23 0739 11/21/23 1139 11/21/23 1540 11/21/23 2135 11/22/23 0735  GLUCAP 146* 211* 125* 191* 144*   D-Dimer: No results for input(s): "DDIMER" in the last 72 hours. Hgb A1c: No results for input(s): "HGBA1C" in the last 72 hours. Lipid Profile: No results for input(s): "CHOL", "HDL", "LDLCALC", "TRIG", "CHOLHDL", "LDLDIRECT" in the last 72 hours. Thyroid function studies: No results for input(s): "TSH", "T4TOTAL", "T3FREE", "THYROIDAB" in the last 72 hours.  Invalid input(s): "FREET3" Anemia work up: No results for input(s): "VITAMINB12", "FOLATE", "FERRITIN", "TIBC", "IRON", "RETICCTPCT" in the last 72 hours. Sepsis Labs: Recent Labs  Lab 11/21/23 0239 11/21/23 1326 11/21/23 1740 11/22/23 0330  WBC 11.8* 12.7* 12.9* 12.1*    Microbiology Recent Results (from the past 240 hours)  Body fluid culture w Gram Stain     Status: None   Collection Time: 11/16/23 11:07 AM   Specimen: PATH Cytology Pleural fluid  Result Value Ref Range Status   Specimen Description   Final    PLEURAL Performed at [facility], [address] [place], [place] [address]    Special Requests   Final    NONE Performed at [facility], [address] [place], [place] [address]    Gram Stain NO WBC SEEN NO ORGANISMS SEEN   Final   Culture   Final    NO GROWTH 3 DAYS Performed at [facility], [address], [place], [place] [address]    Report Status 11/19/2023 FINAL  Final    Procedures and diagnostic studies:  DG Chest 1 View Result Date: 11/22/2023 CLINICAL DATA:  Chest tube in place. EXAM: CHEST  1 VIEW  COMPARISON:  November 21, 2023. FINDINGS: Stable cardiomegaly. Sternotomy wires are noted. Right-sided chest tube is noted with grossly stable right pleural effusion and associated atelectasis. Minimal left basilar subsegmental atelectasis is noted. Bony thorax is unremarkable. IMPRESSION: Stable right-sided chest tube with grossly stable right pleural effusion and associated atelectasis. Electronically Signed   By: Lupita Raider M.D.   On: 11/22/2023 09:38   DG Chest Port 1 View Result Date: 11/21/2023 CLINICAL DATA:  Dyspnea EXAM: PORTABLE CHEST 1 VIEW COMPARISON:  11/21/2023 FINDINGS: Right basilar pigtail chest tube has migrated inferiorly. Small to moderate right pleural effusion persists with associated right basilar atelectasis. Small left pleural effusion is unchanged. Mild left basilar atelectasis. Coronary artery bypass grafting has been performed. Cardiac size is mildly enlarged, unchanged. No pneumothorax. IMPRESSION: 1. Interval inferior migration of the right basilar pigtail chest tube. Persistent small to moderate right pleural effusion. 2. Stable small left pleural effusion. Electronically Signed   By: Helyn Numbers M.D.   On: 11/21/2023 19:58   DG Chest Port 1 View Result Date: 11/21/2023 CLINICAL DATA:  142230 Pleural effusion 142230 EXAM: PORTABLE CHEST 1 VIEW COMPARISON:  11/20/2023 FINDINGS: Right chest tube remains in place. Stable heart size status post sternotomy and CABG. Aortic atherosclerosis. Moderate right pleural effusion with a small amount of fissural fluid, stable. Similar hazy right basilar opacity. Left lung appears clear. No pneumothorax. IMPRESSION: Moderate right pleural effusion, stable. Right chest tube remains in place. Electronically Signed   By: Duanne Guess D.O.   On: 11/21/2023 12:02   DG Chest Port 1 View Result Date: 11/20/2023 CLINICAL DATA:  Chest tube in place. EXAM: PORTABLE CHEST 1 VIEW COMPARISON:  Radiograph earlier today FINDINGS: Right pigtail  catheter is coiled in the lateral mid hemithorax. Decreased right pleural effusion. Small pleural fluid  persists with fluid tracking in the fissure. Mild ill-defined opacity in the right lung base. No visible pneumothorax. No focal left lung opacity. IMPRESSION: 1. Right pigtail catheter with decreased right pleural effusion. Small pleural fluid persists with fluid tracking in the fissure. 2. Mild ill-defined opacity in the right lung base, likely atelectasis. Electronically Signed   By: Narda Rutherford M.D.   On: 11/20/2023 22:42   ABORTED INVASIVE LAB PROCEDURE Addendum Date: 11/20/2023 Mr. Caralee Ates presented for right and left heart catheterization with possible PCI for evaluation of progressive shortness of breath in the setting of recurrent right pleural effusions.  He underwent therapeutic thoracentesis 4 days ago due to orthopnea.  On arrival today, he reported no significant improvement in his dyspnea following the procedure.  He was also found to be back in atrial fibrillation.  He was positioned on the catheterization table and fluoroscopy of the chest performed, revealing a large right pleural effusion with near complete collapse of the right lung (minimal effusion present on postthoracentesis chest radiograph 4 days ago).  Catheterization was canceled; venous and arterial access were never obtained.  Patient was transferred back to the holding area for further workup and management of large right pleural effusion, concerning for hemothorax given recent right thoracentesis and rapid accumulation of fluid.  Result Date: 11/20/2023 This case was aborted.  IR PERC PLEURAL DRAIN W/INDWELL CATH W/IMG GUIDE Result Date: 11/20/2023 INDICATION: 83 year old male with a history anticoagulation and prior thoracentesis, presents with new onset right hemothorax referred for thoracostomy tube EXAM: IMAGE GUIDED PLACEMENT OF RIGHT-SIDED THORACOSTOMY TUBE MEDICATIONS: None. ANESTHESIA/SEDATION: Moderate  (conscious) sedation was not employed during this procedure. A total of Versed 0 mg and Fentanyl 25 mcg was administered intravenously by the radiology nurse. Total intra-service moderate Sedation Time: 0 minutes. The patient's level of consciousness and vital signs were monitored continuously by radiology nursing throughout the procedure under my direct supervision. COMPLICATIONS: None PROCEDURE: The procedure, risks, benefits, and alternatives were explained to the patient/patient's family, who provided informed consent on the patient's behalf. Specific risks that were addressed included bleeding, infection, ongoing pneumothorax, need for further procedure/surgery, chance of hemorrhage, hemoptysis, cardiopulmonary collapse, death. Questions regarding the procedure were encouraged and answered. The patient understands and consents to the procedure. Patient was positioned in the right anterior oblique position on the IR table and scout image of the chest was performed for planning purposes. The right mid axillary line at the level of the nipple was identified, and prepped and draped in the usual sterile fashion. The skin and subcutaneous tissues were generously infiltrated 1% lidocaine for local anesthesia. A Yueh needle was then used to enter the pleural space with aspiration of hemothorax. The plastic Yueh catheter was advanced into the pleural space and an 035 guidewire was advanced under fluoroscopy. Dilation of the skin tract was performed over the wire, and then modified Seldinger technique was used to place a 16 French pigtail catheter Catheter was attached to water seal chamber and suction was applied confirming a operational chest tube. A proximally 1700 cc of hemothorax of evacuated. A new pleurevac chamber was attached 4:26pm. Retention suture was placed.  Sterile dressing was placed. Patient tolerated the procedure well and remained hemodynamically stable throughout. No complications were encountered and  no significant blood loss was encounter IMPRESSION: Status post right-sided thoracostomy tube for hemothorax. Signed, Yvone Neu. Miachel Roux, RPVI [facility] Radiology Electronically Signed   By: Gilmer Mor D.O.   On: 11/20/2023  16:45   CT CHEST WO CONTRAST Result Date: 11/20/2023 CLINICAL DATA:  Shortness of breath.  Recent thoracentesis. EXAM: CT CHEST WITHOUT CONTRAST TECHNIQUE: Multidetector CT imaging of the chest was performed following the standard protocol without IV contrast. RADIATION DOSE REDUCTION: This exam was performed according to the departmental dose-optimization program which includes automated exposure control, adjustment of the mA and/or kV according to patient size and/or use of iterative reconstruction technique. COMPARISON:  Radiograph earlier today. Radiograph 11/16/2023. Most recent CT 05/16/2023 FINDINGS: Cardiovascular: The heart is mildly enlarged. Coronary artery calcifications post CABG. Calcifications of the aortic valve upper normal ascending aortic caliber 3.9 cm. Moderate aortic atherosclerosis. No pericardial effusion. Mediastinum/Nodes: No enlarged mediastinal lymph nodes. Hilar assessment is limited on this unenhanced exam. The esophagus is mildly patulous, no wall thickening. Lungs/Pleura: Large right pleural effusion is partially loculated. It is difficult to delineate compressed lung in the right lower lobe from complex fluid, however suspect heterogeneous increased density within the right pleural effusion dependently and laterally. Complete consolidation throughout the right lower lobe with subtotal consolidation in the dependent right upper and right middle lobes. It is unclear if this represents compressive atelectasis or infection. Cannot exclude underlying neoplasm. Subsegmental linear atelectasis in the left upper lobe. The left lung is otherwise clear. There is no left pleural effusion. Retained mucus within  the trachea. Upper Abdomen: There is fluid posterior to the right lobe of the liver with area of decreased density in the posterior right lobe, series 3, image 158. This has increased from September CT. Musculoskeletal: Prior median sternotomy. Degenerative change throughout the thoracic spine. No acute osseous findings or bony destructive change. IMPRESSION: 1. Large right pleural effusion, partially loculated. It is difficult to delineate compressed lung in the right lower lobe from complex fluid, however suspect heterogeneous increased density within the right pleural effusion dependently and laterally. This may represent blood products or infection. Consider fluid sampling or contrast-enhanced exam for more more detailed assessment 2. Complete consolidation throughout the right lower lobe with subtotal consolidation in the dependent right upper and right middle lobes. It is unclear if this represents compressive atelectasis or infection. Cannot exclude underlying neoplasm. 3. Fluid posterior to the right lobe of the liver with area of decreased density in the posterior right lobe. This has increased from September CT. Etiology is indeterminate. Abdominal CT (with IV contrast in the absence of contraindication) would be helpful for more detailed assessment. 4. Retained mucus within the trachea. Aortic Atherosclerosis (ICD10-I70.0). Electronically Signed   By: Narda Rutherford M.D.   On: 11/20/2023 15:25   DG Chest Port 1 View Result Date: 11/20/2023 CLINICAL DATA:  Shortness of breath. EXAM: PORTABLE CHEST 1 VIEW COMPARISON:  11/16/2023 FINDINGS: Progressive right pleural effusion, no large, and partially loculated. Fluid tracks in the fissure. There is associated opacity throughout the right hemithorax which is indeterminate for atelectasis or airspace disease. Prior median sternotomy and CABG. Grossly stable heart size and mediastinal contours. No left lung opacity or left pleural effusion. No pneumothorax.  IMPRESSION: Progressive right pleural effusion, now large and partially loculated. Associated opacity throughout the right hemithorax is indeterminate for atelectasis or airspace disease. Electronically Signed   By: Narda Rutherford M.D.   On: 11/20/2023 15:15               LOS: 2 days   Heavenlee Maiorana  [facility]   Pager on [email or web address]. If 7PM-7AM, please contact night-coverage at [email or web address]     11/22/2023, 10:32 AM

## 2023-11-23 ENCOUNTER — Inpatient Hospital Stay

## 2023-11-23 DIAGNOSIS — D696 Thrombocytopenia, unspecified: Secondary | ICD-10-CM | POA: Diagnosis not present

## 2023-11-23 DIAGNOSIS — I5022 Chronic systolic (congestive) heart failure: Secondary | ICD-10-CM

## 2023-11-23 DIAGNOSIS — J942 Hemothorax: Secondary | ICD-10-CM | POA: Diagnosis not present

## 2023-11-23 DIAGNOSIS — D72829 Elevated white blood cell count, unspecified: Secondary | ICD-10-CM

## 2023-11-23 DIAGNOSIS — J9601 Acute respiratory failure with hypoxia: Secondary | ICD-10-CM | POA: Diagnosis not present

## 2023-11-23 DIAGNOSIS — Z515 Encounter for palliative care: Secondary | ICD-10-CM

## 2023-11-23 DIAGNOSIS — I2510 Atherosclerotic heart disease of native coronary artery without angina pectoris: Secondary | ICD-10-CM | POA: Diagnosis not present

## 2023-11-23 LAB — CBC
HCT: 28.4 % — ABNORMAL LOW (ref 39.0–52.0)
HCT: 30.3 % — ABNORMAL LOW (ref 39.0–52.0)
Hemoglobin: 8.9 g/dL — ABNORMAL LOW (ref 13.0–17.0)
Hemoglobin: 9.4 g/dL — ABNORMAL LOW (ref 13.0–17.0)
MCH: 26.3 pg (ref 26.0–34.0)
MCH: 26.3 pg (ref 26.0–34.0)
MCHC: 31 g/dL (ref 30.0–36.0)
MCHC: 31.3 g/dL (ref 30.0–36.0)
MCV: 84 fL (ref 80.0–100.0)
MCV: 84.6 fL (ref 80.0–100.0)
Platelets: 438 10*3/uL — ABNORMAL HIGH (ref 150–400)
Platelets: 452 10*3/uL — ABNORMAL HIGH (ref 150–400)
RBC: 3.38 MIL/uL — ABNORMAL LOW (ref 4.22–5.81)
RBC: 3.58 MIL/uL — ABNORMAL LOW (ref 4.22–5.81)
RDW: 17.9 % — ABNORMAL HIGH (ref 11.5–15.5)
RDW: 18.1 % — ABNORMAL HIGH (ref 11.5–15.5)
WBC: 14.7 10*3/uL — ABNORMAL HIGH (ref 4.0–10.5)
WBC: 16.6 10*3/uL — ABNORMAL HIGH (ref 4.0–10.5)
nRBC: 0 % (ref 0.0–0.2)
nRBC: 0 % (ref 0.0–0.2)

## 2023-11-23 LAB — CBC WITH DIFFERENTIAL/PLATELET
Abs Immature Granulocytes: 0.12 10*3/uL — ABNORMAL HIGH (ref 0.00–0.07)
Basophils Absolute: 0 10*3/uL (ref 0.0–0.1)
Basophils Relative: 0 %
Eosinophils Absolute: 0.1 10*3/uL (ref 0.0–0.5)
Eosinophils Relative: 1 %
HCT: 30.8 % — ABNORMAL LOW (ref 39.0–52.0)
Hemoglobin: 9.4 g/dL — ABNORMAL LOW (ref 13.0–17.0)
Immature Granulocytes: 1 %
Lymphocytes Relative: 14 %
Lymphs Abs: 2 10*3/uL (ref 0.7–4.0)
MCH: 26.6 pg (ref 26.0–34.0)
MCHC: 30.5 g/dL (ref 30.0–36.0)
MCV: 87.3 fL (ref 80.0–100.0)
Monocytes Absolute: 1.1 10*3/uL — ABNORMAL HIGH (ref 0.1–1.0)
Monocytes Relative: 8 %
Neutro Abs: 11.2 10*3/uL — ABNORMAL HIGH (ref 1.7–7.7)
Neutrophils Relative %: 76 %
Platelets: 451 10*3/uL — ABNORMAL HIGH (ref 150–400)
RBC: 3.53 MIL/uL — ABNORMAL LOW (ref 4.22–5.81)
RDW: 18.2 % — ABNORMAL HIGH (ref 11.5–15.5)
WBC: 14.5 10*3/uL — ABNORMAL HIGH (ref 4.0–10.5)
nRBC: 0 % (ref 0.0–0.2)

## 2023-11-23 LAB — PROTEIN, PLEURAL OR PERITONEAL FLUID: Total protein, fluid: 3.5 g/dL

## 2023-11-23 LAB — GLUCOSE, CAPILLARY
Glucose-Capillary: 138 mg/dL — ABNORMAL HIGH (ref 70–99)
Glucose-Capillary: 225 mg/dL — ABNORMAL HIGH (ref 70–99)
Glucose-Capillary: 167 mg/dL — ABNORMAL HIGH (ref 70–99)
Glucose-Capillary: 147 mg/dL — ABNORMAL HIGH (ref 70–99)

## 2023-11-23 LAB — EXPECTORATED SPUTUM ASSESSMENT W GRAM STAIN, RFLX TO RESP C

## 2023-11-23 LAB — BODY FLUID CELL COUNT WITH DIFFERENTIAL
Eos, Fluid: 0 %
Lymphs, Fluid: 3 %
Monocyte-Macrophage-Serous Fluid: 1 % — ABNORMAL LOW (ref 50–90)
Neutrophil Count, Fluid: 96 % — ABNORMAL HIGH (ref 0–25)
Total Nucleated Cell Count, Fluid: 8712 uL — ABNORMAL HIGH (ref 0–1000)

## 2023-11-23 LAB — BASIC METABOLIC PANEL
Anion gap: 12 (ref 5–15)
BUN: 62 mg/dL — ABNORMAL HIGH (ref 8–23)
CO2: 29 mmol/L (ref 22–32)
Calcium: 8.4 mg/dL — ABNORMAL LOW (ref 8.9–10.3)
Chloride: 94 mmol/L — ABNORMAL LOW (ref 98–111)
Creatinine, Ser: 1.29 mg/dL — ABNORMAL HIGH (ref 0.61–1.24)
GFR, Estimated: 56 mL/min — ABNORMAL LOW (ref >60)
Glucose, Bld: 152 mg/dL — ABNORMAL HIGH (ref 70–99)
Potassium: 4.8 mmol/L (ref 3.5–5.1)
Sodium: 135 mmol/L (ref 135–145)

## 2023-11-23 LAB — MRSA NEXT GEN BY PCR, NASAL: MRSA by PCR Next Gen: NOT DETECTED

## 2023-11-23 LAB — MAGNESIUM: Magnesium: 3 mg/dL — ABNORMAL HIGH (ref 1.7–2.4)

## 2023-11-23 LAB — LACTATE DEHYDROGENASE, PLEURAL OR PERITONEAL FLUID: LD, Fluid: 576 U/L — ABNORMAL HIGH (ref 3–23)

## 2023-11-23 LAB — GLUCOSE, PLEURAL OR PERITONEAL FLUID: Glucose, Fluid: 91 mg/dL

## 2023-11-23 LAB — PATHOLOGIST SMEAR REVIEW

## 2023-11-23 MED ORDER — LACTATED RINGERS IV BOLUS
500.0000 mL | Freq: Once | INTRAVENOUS | Status: AC
  Administered 2023-11-23: 500 mL via INTRAVENOUS

## 2023-11-23 MED ORDER — POLYETHYLENE GLYCOL 3350 17 G PO PACK
17.0000 g | PACK | Freq: Every day | ORAL | Status: DC
  Administered 2023-11-23 – 2023-11-28 (×4): 17 g via ORAL
  Filled 2023-11-23 (×7): qty 1

## 2023-11-23 MED ORDER — PIPERACILLIN-TAZOBACTAM 3.375 G IVPB
3.3750 g | Freq: Three times a day (TID) | INTRAVENOUS | Status: DC
  Administered 2023-11-23 – 2023-11-29 (×18): 3.375 g via INTRAVENOUS
  Filled 2023-11-23 (×18): qty 50

## 2023-11-23 NOTE — Progress Notes (Addendum)
 MD at bedside.  Notified pt having frequent PVCs. No new orders at this time.  Notified increased confusion on 0800 assessment. No new orders at this time.  Notified last BM 11/18/23. Awaiting orders for stool softener.

## 2023-11-23 NOTE — Plan of Care (Signed)
  Problem: Cardiovascular: Goal: Ability to achieve and maintain adequate cardiovascular perfusion will improve Outcome: Progressing   Problem: Education: Goal: Knowledge of General Education information will improve Description: Including pain rating scale, medication(s)/side effects and non-pharmacologic comfort measures Outcome: Progressing   Problem: Clinical Measurements: Goal: Ability to maintain clinical measurements within normal limits will improve Outcome: Progressing Goal: Respiratory complications will improve Outcome: Progressing   Problem: Activity: Goal: Risk for activity intolerance will decrease Outcome: Progressing   Problem: Nutrition: Goal: Adequate nutrition will be maintained Outcome: Progressing   Problem: Coping: Goal: Level of anxiety will decrease Outcome: Progressing   Problem: Elimination: Goal: Will not experience complications related to urinary retention Outcome: Progressing   Problem: Pain Managment: Goal: General experience of comfort will improve and/or be controlled Outcome: Progressing   Problem: Safety: Goal: Ability to remain free from injury will improve Outcome: Progressing   Problem: Skin Integrity: Goal: Risk for impaired skin integrity will decrease Outcome: Progressing   Problem: Coping: Goal: Ability to adjust to condition or change in health will improve Outcome: Progressing   Problem: Fluid Volume: Goal: Ability to maintain a balanced intake and output will improve Outcome: Progressing   Problem: Nutritional: Goal: Maintenance of adequate nutrition will improve Outcome: Progressing   Problem: Tissue Perfusion: Goal: Adequacy of tissue perfusion will improve Outcome: Progressing

## 2023-11-23 NOTE — Progress Notes (Signed)
 NAME:  [name] Young., MRN:  [id number], DOB:  10-20-1940, LOS: 3 ADMISSION DATE:  11/20/2023  History of Present Illness:  [name] is a pleasant 83 year old male patient with a past medical history of recurrent pleural effusion status post multiple thoracentesis thought to be transudate presenting to [facility] for an outpatient left heart cath and right heart cath.  He was found to have a complete whiteout of the right hemithorax suspicious for a hemothorax.  Patient underwent thoracentesis on 03/07 and drained 800 cc of serous fluid.  And since then he has been complaining of sharp right-sided chest pain associated with worsening shortness of breath.  He presents today for a left heart cath and was noted on fluoroscopy to have a near complete whiteout of the right hemithorax.  Therefore left heart cath was canceled.  Labs were noticeable for drop in hemoglobin from 10 g/dL at baseline to 7.1 g/dL at baseline.  A CT scan without contrast of the chest was obtained which showed a large right pleural effusion with heterogeneous densities highly suspicious for a hemothorax.  IR graciously placed a right 45 [place] skater with 1.8 L of serosanguineous fluid drained.  He remained hemodynamically throughout procedure.  Pertinent  Medical History  As above  Significant Hospital Events: Including procedures, antibiotic start and stop dates in addition to other pertinent events   11/20/2023 Admitted to the ICU s/p right 16 Fr Skater. 1.8L Serosanguinous fluid drained.  11/21/23: Tpa 10mg  Given intrapleural, output was 1800cc yesterday of sanguinous material. CXR appeared improved.   Subjective:  Reports breathing has improved. Has no major complaints.  CXR this AM improved aeration in the RLL 1.5L Drainage in 24hrs.  Chest tube flushed this am.   Objective   Blood pressure 110/60, pulse 71, temperature (!) 97.1 F (36.2 C), temperature source Axillary, resp. rate 20, height 5\' 10"  (1.778 m),  weight 99.5 kg, SpO2 96%.        Intake/Output Summary (Last 24 hours) at 11/23/2023 1236 Last data filed at 11/23/2023 1200 Gross per 24 hour  Intake --  Output 1844 ml  Net -1844 ml   Filed Weights   11/21/23 0500 11/22/23 0347 11/23/23 0400  Weight: 106.1 kg 101.7 kg 99.5 kg    Examination: General: Alert awake  and oriented. HD stable.  HENT: Supple neck reactive pupils Lungs: Improved breath sounds over the left hemithorax.  Cardiovascular: Normal S1, Normal S2, RRR, no murmurs or ES appreciated Abdomen: Soft, non tender, non distended, +BS  Extremities: Warm and well perfused.   Labs and imaging were reviewed.   Assessment & Plan:  [name] is a pleasant 83 year old male patient with a past medical history of recurrent pleural effusion status post multiple thoracentesis thought to be transudate presenting to [facility] for an outpatient left heart cath and right heart cath.  He was found to have a complete whiteout of the right hemithorax suspicious for a hemothorax.  #Acute hypoxic respiratory failure secondary to...  #Large right hemothorax in the setting of recent thoracentesis on 11/16/2023 while being on Eliquis. S/p 16Fr Skater placement on 03/11. Appears to be venous bleed given hemodynamic stability and lack of ongoing pleural drainage.  #AKI like prerenal in the setting acute anemia  #Acute Anemia in the setting of the above.  #Paroxysmal A.fib on eliquis.   Labs this AM with worsening thrombocytopenia and leukocytosis. Concern for empyema, fluid from the tube will be sent for culture. Drainage appears to be well, H&H stable.  Will hold off on further tpa for today given good output.  []  CT chest wo contrast  []  CT to suction at -20cmH2O.  []  Should he show any worsening output, hemodynamic instability or drop in hemogloibin, CTA chest should be obtained and IR should be contacted for possible embolization. If that is not feasible than [facility] after  stabilization for thoracic surgery eval should be pursued.  []  Daily CBC  []  Fluid for culutre and cell count  []  Start Zosyn for abx coverage given concern for possible empyema.  []  MRSA swab  Pulmonary will follow closely.  Please do not hesitate to reach out with any concerns.   I spent 50 minutes caring for this patient today, including preparing to see the patient, obtaining a medical history , reviewing a separately obtained history, performing a medically appropriate examination and/or evaluation, counseling and educating the patient/family/caregiver, ordering medications, tests, or procedures, documenting clinical information in the electronic health record, and independently interpreting results (not separately reported/billed) and communicating results to the patient/family/caregiver  Janann Colonel, MD [facility] 11/23/2023 12:36 PM

## 2023-11-23 NOTE — Progress Notes (Addendum)
 Progress Note    [name] Merl.  ZOX:[id number] DOB: [birth year]  DOA: 11/20/2023 PCP: [name], MD      Brief Narrative:    Medical records reviewed and are as summarized below:  [name] Merl. is a 83 y.o. male  with medical history significant for chronic kidney disease stage III, BPH, HTN, DM2, paroxysmal atrial fibrillation (Eliquis), chronic diastolic heart failure with recurrent pleural effusions. He is familiar to IR from multiple thoracenteses with the last one on 11/16/23. 800 ml of yellow fluid was removed and post-procedure CXR was negative for a pneumothorax. Shortly after leaving the hospital he developed pleuritic right-sided chest pain that persisted.   He presented to the hospital on 11/20/2023 for right and left heart catheterization. Fluoroscopy of the right chest was performed before access was obtained and this showed a large right pleural effusion with near complete collapse of the right lung. The catheterization was cancelled and additional imaging obtained shows a hemothorax. His labs showed a 3 gm drop in hemoglobin.   He was admitted to the ICU for right hemothorax complicated by acute blood loss anemia.  Chest tube was placed in the right chest wall by interventional radiologist.  He was transfused 1 unit of PRBCs on 11/20/2023.  He was transferred from the ICU to [facility] on 11/22/2023.     Assessment/Plan:   Principal Problem:   Hemothorax Active Problems:   PAF (paroxysmal atrial fibrillation) (HCC)   Aortic valve stenosis   Hemothorax on right    Body mass index is 31.47 kg/m.  (Obesity)   Right hemothorax with complete collapse of right lung, recent right thoracentesis on 11/16/2023: S/p right chest tube placement with about 1.7 L of bloody output on 11/20/2023 by IR.   Analgesics as needed for pain.  He has been getting intrapleural tPA. Case was discussed with Dr. Larinda Buttery, pulmonologist.  He recommended  sputum culture and addition of IV Zosyn. Repeat chest x-ray on 11/23/2023 showed stable right-sided chest tube, stable bilateral pleural effusions with associated subsegmental atelectasis.   Acute blood loss anemia: Improved.  Hemoglobin up to 9.4.  H&H is stable.  S/p transfusion 1 unit of PRBCs on 11/20/2023 for hemoglobin of 7.2.   AKI: Improved.  Creatinine is stable at 1.29.   Creatinine was 1.71 on admission.    Acute hypoxic respiratory failure: Improved.  He is tolerating room air.   Paroxysmal atrial fibrillation, nonsustained VT, frequent PVCs on telemetry: Continue metoprolol.  Eliquis has been held.   Chronic diastolic CHF, CAD, moderate aortic valve stenosis compensated. Lasix and metolazone on hold 2D echo showed EF estimated at 40 to 45%, grade 1 diastolic dysfunction, mild LVH, mild MR, mild AR, mild to moderate AAS. 2D echo in April 2024 showed EF estimated at 50 to 55%.   General Weakness: PT and OT recommended SNF at discharge.  Diet Order             Diet heart healthy/carb modified Room service appropriate? Yes; Fluid consistency: Thin  Diet effective now                            Consultants: Interventional radiologist Cardiologist Pulmonologist  Procedures: Right chest tube placement on 11/20/2023    Medications:    sodium chloride   Intravenous Once   acetaminophen  650 mg Oral Q6H   Chlorhexidine Gluconate Cloth  6 each Topical Daily  insulin aspart  0-15 Units Subcutaneous TID WC   insulin aspart  0-5 Units Subcutaneous QHS   metoprolol tartrate  25 mg Oral BID   mirtazapine  15 mg Oral QHS   polyethylene glycol  17 g Oral Daily   rosuvastatin  10 mg Oral Daily   Continuous Infusions:  piperacillin-tazobactam (ZOSYN)  IV 3.375 g (11/23/23 1010)     Anti-infectives (From admission, onward)    Start     Dose/Rate Route Frequency Ordered Stop   11/23/23 1000  piperacillin-tazobactam (ZOSYN) IVPB 3.375 g        3.375  g 12.5 mL/hr over 240 Minutes Intravenous Every 8 hours 11/23/23 0908                Family Communication/Anticipated D/C date and plan/Code Status   DVT prophylaxis: SCDs Start: 11/20/23 1353     Code Status: Full Code  Family Communication: Plan discussed with his wife at the bedside Disposition Plan: Plan to discharge to SNF   Status is: Inpatient Remains inpatient appropriate because: Right-sided hemithorax       Subjective:   Interval events noted.  He complains of pain on right chest wall.  Judeth Cornfield, RN, reported that patient was confused earlier this morning.  However, at the time of my visit, his confusion had improved.  Objective:    Vitals:   11/23/23 1000 11/23/23 1002 11/23/23 1100 11/23/23 1153  BP: (!) 120/51 (!) 120/51 (!) 118/56   Pulse: 86 (!) 108 72   Resp: 20  20 20   Temp:    (!) 97.1 F (36.2 C)  TempSrc:    Axillary  SpO2: 99%  97%   Weight:      Height:       No data found.   Intake/Output Summary (Last 24 hours) at 11/23/2023 1218 Last data filed at 11/23/2023 1200 Gross per 24 hour  Intake --  Output 1844 ml  Net -1844 ml   Filed Weights   11/21/23 0500 11/22/23 0347 11/23/23 0400  Weight: 106.1 kg 101.7 kg 99.5 kg    Exam:   GEN: NAD SKIN: Warm and dry EYES: No pallor or icterus ENT: MMM CV: Regular rate and rhythm, PULM: Chest tube in right lateral chest wall, bibasilar rales. ABD: soft, obese, NT, +BS CNS: AAO x 3, non focal EXT: No edema or tenderness     Data Reviewed:   I have personally reviewed following labs and imaging studies:  Labs: Labs show the following:   Basic Metabolic Panel: Recent Labs  Lab 11/20/23 1220 11/21/23 0239 11/22/23 0330 11/22/23 1450 11/23/23 0407  NA 133* 134* 135  --  135  K 4.6 4.2 4.4 4.3 4.8  CL 91* 94* 95*  --  94*  CO2 32 31 31  --  29  GLUCOSE 202* 150* 145*  --  152*  BUN 75* 67* 62*  --  62*  CREATININE 1.71* 1.45* 1.27*  --  1.29*  CALCIUM 8.1* 7.8*  7.7*  --  8.4*  MG  --  3.1* 3.0* 3.0* 3.0*  PHOS  --  4.3 4.2  --   --    GFR Estimated Creatinine Clearance: 53.1 mL/min (A) (by C-G formula based on SCr of 1.29 mg/dL (H)). Liver Function Tests: Recent Labs  Lab 11/20/23 1220 11/21/23 0239 11/22/23 0330  AST 37  --   --   ALT 38  --   --   ALKPHOS 68  --   --  BILITOT 0.6  --   --   PROT 7.0  --   --   ALBUMIN 2.4* 2.2* 2.1*   No results for input(s): "LIPASE", "AMYLASE" in the last 168 hours. No results for input(s): "AMMONIA" in the last 168 hours. Coagulation profile Recent Labs  Lab 11/21/23 0902  INR 1.4*    CBC: Recent Labs  Lab 11/20/23 1220 11/20/23 1824 11/21/23 0239 11/21/23 1326 11/21/23 1740 11/22/23 0330 11/23/23 0006 11/23/23 0406 11/23/23 0407  WBC 13.5*   < > 11.8*   < > 12.9* 12.1* 16.6* 14.5* 14.7*  NEUTROABS 11.4*  --  8.6*  --   --   --   --  11.2*  --   HGB 7.2*   < > 8.1*   < > 8.3* 8.3* 8.9* 9.4* 9.4*  HCT 23.7*   < > 25.9*   < > 27.7* 27.2* 28.4* 30.8* 30.3*  MCV 86.8   < > 86.3   < > 87.9 86.1 84.0 87.3 84.6  PLT 363   < > 345   < > 378 379 452* 451* 438*   < > = values in this interval not displayed.   Cardiac Enzymes: No results for input(s): "CKTOTAL", "CKMB", "CKMBINDEX", "TROPONINI" in the last 168 hours. BNP (last 3 results) Recent Labs    12/21/22 1455 04/25/23 1516  PROBNP 357.0* 142.0*   CBG: Recent Labs  Lab 11/22/23 1519 11/22/23 2131 11/22/23 2334 11/23/23 0723 11/23/23 1142  GLUCAP 144* 149* 138* 138* 225*   D-Dimer: No results for input(s): "DDIMER" in the last 72 hours. Hgb A1c: No results for input(s): "HGBA1C" in the last 72 hours. Lipid Profile: No results for input(s): "CHOL", "HDL", "LDLCALC", "TRIG", "CHOLHDL", "LDLDIRECT" in the last 72 hours. Thyroid function studies: No results for input(s): "TSH", "T4TOTAL", "T3FREE", "THYROIDAB" in the last 72 hours.  Invalid input(s): "FREET3" Anemia work up: No results for input(s): "VITAMINB12",  "FOLATE", "FERRITIN", "TIBC", "IRON", "RETICCTPCT" in the last 72 hours. Sepsis Labs: Recent Labs  Lab 11/22/23 0330 11/23/23 0006 11/23/23 0406 11/23/23 0407  WBC 12.1* 16.6* 14.5* 14.7*    Microbiology Recent Results (from the past 240 hours)  Body fluid culture w Gram Stain     Status: None   Collection Time: 11/16/23 11:07 AM   Specimen: PATH Cytology Pleural fluid  Result Value Ref Range Status   Specimen Description   Final    PLEURAL Performed at [facility], [address] [place], [place] [address]    Special Requests   Final    NONE Performed at [facility], [address] [place], [place] [address]    Gram Stain NO WBC SEEN NO ORGANISMS SEEN   Final   Culture   Final    NO GROWTH 3 DAYS Performed at [facility], [address] [place], [place] [address]    Report Status 11/19/2023 FINAL  Final    Procedures and diagnostic studies:  DG Chest Port 1 View Result Date: 11/23/2023 CLINICAL DATA:  Pleural effusion. EXAM: PORTABLE CHEST 1 VIEW COMPARISON:  November 22, 2023. FINDINGS: Stable cardiomediastinal silhouette. Sternotomy wires are noted. Stable right-sided pleural drainage catheter. No pneumothorax. Stable bibasilar atelectasis with small pleural effusions. IMPRESSION: Stable right-sided chest tube. Stable bilateral pleural effusions with associated subsegmental atelectasis. Electronically Signed   By: Lupita Raider M.D.   On: 11/23/2023 09:05   ECHOCARDIOGRAM COMPLETE Result Date: 11/22/2023    ECHOCARDIOGRAM REPORT   Patient Name:   [name]  Flonnie Young. Date of Exam: 11/22/2023 Medical Rec #:  [id number]            Height:       70.0 in Accession #:    [id number]           Weight:       224.2 lb Date of Birth:  04-25-1941           BSA:          2.191 m Patient Age:    82 years             BP:           114/63 mmHg Patient Gender: M                    HR:           87 bpm. Exam Location:  [facility] Procedure: 2D Echo, Cardiac Doppler  and Color Doppler (Both Spectral and Color            Flow Doppler were utilized during procedure). Indications:     CHF  History:         Patient has prior history of Echocardiogram examinations, most                  recent 12/30/2022. CHF, CAD, Prior CABG, Arrythmias:PVC, Atrial                  Fibrillation and Bradycardia, Signs/Symptoms:Bacteremia and                  Shortness of Breath; Risk Factors:Diabetes and Dyslipidemia.                  CKD, MV endocarditis.  Sonographer:     Mikki Harbor Referring Phys:  [id number] Antonieta Iba Diagnosing Phys: Julien Nordmann MD  Sonographer Comments: Technically difficult study due to poor echo windows and no subcostal window. Image acquisition challenging due to respiratory motion. IMPRESSIONS  1. Left ventricular ejection fraction, by estimation, is 40 to 45%. Left ventricular ejection fraction by 2D MOD biplane is 53.7 %. The left ventricle has mildly decreased function. The left ventricle demonstrates global hypokinesis. There is mild left ventricular hypertrophy. Left ventricular diastolic parameters are consistent with Grade I diastolic dysfunction (impaired relaxation).  2. Right ventricular systolic function is normal. The right ventricular size is normal. There is normal pulmonary artery systolic pressure. The estimated right ventricular systolic pressure is 14.3 mmHg.  3. The mitral valve is normal in structure. Mild mitral valve regurgitation. No evidence of mitral stenosis.  4. The aortic valve is normal in structure. There is moderate calcification of the aortic valve. Aortic valve regurgitation is mild. Mild to moderate aortic valve stenosis. Aortic valve mean gradient measures 16.0 mmHg.  5. There is mild dilatation of the aortic root, measuring 43 mm.  6. The inferior vena cava is normal in size with greater than 50% respiratory variability, suggesting right atrial pressure of 3 mmHg. FINDINGS  Left Ventricle: Left ventricular ejection fraction, by  estimation, is 40 to 45%. Left ventricular ejection fraction by 2D MOD biplane is 53.7 %. The left ventricle has mildly decreased function. The left ventricle demonstrates global hypokinesis. Strain  was performed and the global longitudinal strain is indeterminate. The left ventricular internal cavity size was normal in size. There is mild left ventricular hypertrophy. Left ventricular diastolic parameters are consistent with Grade I diastolic dysfunction (impaired relaxation). Right Ventricle: The right  ventricular size is normal. No increase in right ventricular wall thickness. Right ventricular systolic function is normal. There is normal pulmonary artery systolic pressure. The tricuspid regurgitant velocity is 1.68 m/s, and  with an assumed right atrial pressure of 3 mmHg, the estimated right ventricular systolic pressure is 14.3 mmHg. Left Atrium: Left atrial size was normal in size. Right Atrium: Right atrial size was normal in size. Pericardium: There is no evidence of pericardial effusion. Mitral Valve: The mitral valve is normal in structure. There is mild calcification of the mitral valve leaflet(s). Mild to moderate mitral annular calcification. Mild mitral valve regurgitation. No evidence of mitral valve stenosis. MV peak gradient, 6.9  mmHg. The mean mitral valve gradient is 2.0 mmHg. Tricuspid Valve: The tricuspid valve is normal in structure. Tricuspid valve regurgitation is not demonstrated. No evidence of tricuspid stenosis. Aortic Valve: The aortic valve is normal in structure. There is moderate calcification of the aortic valve. Aortic valve regurgitation is mild. Aortic regurgitation PHT measures 1093 msec. Mild to moderate aortic stenosis is present. Aortic valve mean gradient measures 16.0 mmHg. Aortic valve peak gradient measures 30.1 mmHg. Aortic valve area, by VTI measures 1.67 cm. Pulmonic Valve: The pulmonic valve was normal in structure. Pulmonic valve regurgitation is not visualized.  No evidence of pulmonic stenosis. Aorta: The aortic root is normal in size and structure. There is mild dilatation of the aortic root, measuring 43 mm. Venous: The inferior vena cava is normal in size with greater than 50% respiratory variability, suggesting right atrial pressure of 3 mmHg. IAS/Shunts: No atrial level shunt detected by color flow Doppler. Additional Comments: 3D was performed not requiring image post processing on an independent workstation and was indeterminate.  LEFT VENTRICLE PLAX 2D                        Biplane EF (MOD) LVIDd:         5.30 cm         LV Biplane EF:   Left LVIDs:         3.70 cm                          ventricular LV PW:         1.60 cm                          ejection LV IVS:        1.50 cm                          fraction by LVOT diam:     2.30 cm                          2D MOD LV SV:         89                               biplane is LV SV Index:   41                               53.7 %. LVOT Area:     4.15 cm  Diastology                                LV e' medial:    8.05 cm/s LV Volumes (MOD)               LV E/e' medial:  6.2 LV vol d, MOD    79.0 ml       LV e' lateral:   8.05 cm/s A2C:                           LV E/e' lateral: 6.2 LV vol d, MOD    107.0 ml A4C: LV vol s, MOD    35.1 ml A2C: LV vol s, MOD    53.3 ml A4C: LV SV MOD A2C:   43.9 ml LV SV MOD A4C:   107.0 ml LV SV MOD BP:    54.9 ml RIGHT VENTRICLE RV Basal diam:  3.60 cm RV Mid diam:    3.10 cm RV S prime:     14.35 cm/s LEFT ATRIUM             Index        RIGHT ATRIUM           Index LA diam:        3.60 cm 1.64 cm/m   RA Area:     19.40 cm LA Vol (A2C):   67.7 ml 30.90 ml/m  RA Volume:   48.30 ml  22.04 ml/m LA Vol (A4C):   42.8 ml 19.53 ml/m LA Biplane Vol: 58.4 ml 26.65 ml/m  AORTIC VALVE                     PULMONIC VALVE AV Area (Vmax):    1.71 cm      PV Vmax:       1.46 m/s AV Area (Vmean):   1.51 cm      PV Peak grad:  8.5 mmHg AV Area (VTI):     1.67  cm AV Vmax:           274.50 cm/s AV Vmean:          183.500 cm/s AV VTI:            0.531 m AV Peak Grad:      30.1 mmHg AV Mean Grad:      16.0 mmHg LVOT Vmax:         113.00 cm/s LVOT Vmean:        66.850 cm/s LVOT VTI:          0.214 m LVOT/AV VTI ratio: 0.40 AI PHT:            1093 msec  AORTA Ao Root diam: 4.30 cm Ao Asc diam:  3.70 cm MITRAL VALVE                TRICUSPID VALVE MV Area (PHT): 2.70 cm     TR Peak grad:   11.3 mmHg MV Area VTI:   3.43 cm     TR Vmax:        168.00 cm/s MV Peak grad:  6.9 mmHg MV Mean grad:  2.0 mmHg     SHUNTS MV Vmax:       1.31 m/s     Systemic VTI:  0.21 m MV Vmean:      66.1 cm/s    Systemic  Diam: 2.30 cm MV Decel Time: 281 msec MV E velocity: 50.30 cm/s MV A velocity: 112.00 cm/s MV E/A ratio:  0.45 Julien Nordmann MD Electronically signed by Julien Nordmann MD Signature Date/Time: 11/22/2023/1:19:27 PM    Final    DG Chest 1 View Result Date: 11/22/2023 CLINICAL DATA:  Chest tube in place. EXAM: CHEST  1 VIEW COMPARISON:  November 21, 2023. FINDINGS: Stable cardiomegaly. Sternotomy wires are noted. Right-sided chest tube is noted with grossly stable right pleural effusion and associated atelectasis. Minimal left basilar subsegmental atelectasis is noted. Bony thorax is unremarkable. IMPRESSION: Stable right-sided chest tube with grossly stable right pleural effusion and associated atelectasis. Electronically Signed   By: Lupita Raider M.D.   On: 11/22/2023 09:38   DG Chest Port 1 View Result Date: 11/21/2023 CLINICAL DATA:  Dyspnea EXAM: PORTABLE CHEST 1 VIEW COMPARISON:  11/21/2023 FINDINGS: Right basilar pigtail chest tube has migrated inferiorly. Small to moderate right pleural effusion persists with associated right basilar atelectasis. Small left pleural effusion is unchanged. Mild left basilar atelectasis. Coronary artery bypass grafting has been performed. Cardiac size is mildly enlarged, unchanged. No pneumothorax. IMPRESSION: 1. Interval inferior migration  of the right basilar pigtail chest tube. Persistent small to moderate right pleural effusion. 2. Stable small left pleural effusion. Electronically Signed   By: Helyn Numbers M.D.   On: 11/21/2023 19:58               LOS: 3 days   [name] Young  [facility]   Pager on [email or web address]. If 7PM-7AM, please contact night-coverage at [email or web address]     11/23/2023, 12:18 PM

## 2023-11-23 NOTE — Progress Notes (Signed)
 Pharmacy Antibiotic Note  [name] Young. is a 83 y.o. male admitted on 11/20/2023 with pneumonia. Patient has a known history of recurrent pleural effusion s/p multiple thoracentesis through to be transudated. They are s/p thoracentesis with admission but still experiencing SOB. CT scan  Pharmacy has been consulted for Zosyn dosing.  Today, 11/23/2023 WBC 14.7 (elevated but down trending) Scr 1.29 (Baseline ~1.0) Febrile: 98.4 F Imaging: 3/14 chest scan pending   Plan: -- Zosyn 3.375g IV q8h (4 hour infusion). -- Continue to monitor renal function and follow cultures   Height: 5\' 10"  (177.8 cm) Weight: 99.5 kg (219 lb 5.7 oz) IBW/kg (Calculated) : 73  Temp (24hrs), Avg:98.2 F (36.8 C), Min:97.7 F (36.5 C), Max:98.4 F (36.9 C)  Recent Labs  Lab 11/20/23 1220 11/20/23 1824 11/21/23 0239 11/21/23 1326 11/21/23 1740 11/22/23 0330 11/23/23 0006 11/23/23 0406 11/23/23 0407  WBC 13.5*   < > 11.8*   < > 12.9* 12.1* 16.6* 14.5* 14.7*  CREATININE 1.71*  --  1.45*  --   --  1.27*  --   --  1.29*   < > = values in this interval not displayed.    Estimated Creatinine Clearance: 53.1 mL/min (A) (by C-G formula based on SCr of 1.29 mg/dL (H)).    No Known Allergies  Antimicrobials this admission: 3/14 Zosyn >>   Dose adjustments this admission: NA  Microbiology results: 3/7 Pleural Effusion: NGTD   Thank you for allowing pharmacy to be a part of this patient's care.  Effie Shy, PharmD Pharmacy Resident  11/23/2023 9:08 AM

## 2023-11-23 NOTE — Consult Note (Signed)
 Consultation Note Date: 11/23/2023   Patient Name: [name] Young.  DOB: 1941/05/17  MRN: [id number]  Age / Sex: 83 y.o., male  PCP: [name] Boyden, MD Referring Physician: Lurene Shadow, MD  Reason for Consultation: Establishing goals of care   HPI/Brief Hospital Course: 83 y.o. male  with past medical history of recurrent pleural effusions status post multiple thoracenteses, CAD status post CABG in 2019, paroxysmal A-fib, diabetes and CKD admitted on 11/20/2023 scheduled for outpatient heart catheterization which was ultimately canceled due to concern for large right hemothorax.  Noted to have underwent thoracentesis on 3 7 with removal of 800 cc of fluid and had been complaining of right sided chest pain with worsening shortness of breath since procedure.  Chest tube placed by IR on 3/11, since insertion there has been improvement on chest x-ray  Palliative medicine was consulted for assisting with goals of care conversations.  Subjective:  Extensive chart review has been completed prior to meeting patient including labs, vital signs, imaging, progress notes, orders, and available advanced directive documents from current and previous encounters.  Visited with [name] Young at his bedside.  He is awake, alert and able to engage in conversation.  Wife [name] Young at bedside during time of visit.  Introduced myself as a Publishing rights manager as a member of the palliative care team. Explained palliative medicine is specialized medical care for people living with serious illness. It focuses on providing relief from the symptoms and stress of a serious illness. The goal is to improve quality of life for both the patient and the family.   [name] Young and [name] Young recall previous visits with PMT service during previous hospitalizations at [facility] last year.  [name] Young shares he and [name] Young have been married for 60 years, they had a total of 3 children whom  of which 2 are deceased and 1 is still living her name is [name] Young and she has been appointed as [facility] POA according to AD found in [place] and confirmed by [name] Young.  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  [name] Young able to share his understanding of current medical conditions.  He shares he has had a total of 7 thoracenteses and his providers have been unable to share with him the reasoning behind this.  He is hopeful to get answers.  At baseline [name] Young shares he functions basically independent.  We discussed patient's current illness and what it means in the larger context of patient's on-going co-morbidities. Natural disease trajectory and expectations at EOL were discussed.   Attempted to elicit goals of care.  We discussed CODE STATUS and the difference between full code and DO NOT RESUSCITATE. Encouraged patient/family to consider DNR/DNI status understanding evidenced based poor outcomes in similar hospitalized patients, as the cause of the arrest is likely associated with chronic/terminal disease rather than a reversible acute cardio-pulmonary event.  [name] Young shares in the past he has been DNR but during his last hospitalization decided to reversed to full code and desires to remain Full Code at this time.  He does share he would not be excepting of long-term life preserving measures.  I discussed importance of continued conversations with family/support persons and all members of their medical team regarding overall plan of care and treatment options ensuring decisions are in alignment with patients goals of care.  All  questions/concerns addressed. Emotional support provided to patient/family/support persons. PMT will continue to follow and support patient as needed.  Objective: Primary Diagnoses: Present on Admission:  Hemothorax   Hemothorax on right  PAF (paroxysmal atrial fibrillation) (HCC)   Physical Exam Constitutional:      General: He is not in acute distress. Pulmonary:     Effort: Pulmonary effort is normal. No respiratory distress.  Skin:    General: Skin is warm and dry.  Neurological:     Mental Status: He is alert and oriented to person, place, and time.     Motor: No weakness.     Vital Signs: BP 107/63   Pulse 67   Temp (!) 97.2 F (36.2 C) (Axillary)   Resp (!) 23   Ht 5\' 10"  (1.778 m)   Wt 99.5 kg   SpO2 96%   BMI 31.47 kg/m  Pain Scale: 0-10 POSS *See Group Information*: 1-Acceptable,Awake and alert Pain Score: 2   IO: Intake/output summary:  Intake/Output Summary (Last 24 hours) at 11/23/2023 1744 Last data filed at 11/23/2023 1600 Gross per 24 hour  Intake 5 ml  Output 1900 ml  Net -1895 ml    LBM: Last BM Date : 11/18/23 Baseline Weight: Weight: 106.1 kg Most recent weight: Weight: 99.5 kg      Assessment and Plan  SUMMARY OF RECOMMENDATIONS   Ongoing GOC needed High risk for decompensation  Palliative Prophylaxis:   Bowel Regimen, Delirium Protocol and Frequent Pain Assessment  Discussed With: Nursing staff   Thank you for this consult and allowing Palliative Medicine to participate in the care of [name] Young. "[name]". Palliative medicine will continue to follow and assist as needed.   Time Total: 75 minutes  Time spent includes: Detailed review of medical records (labs, imaging, vital signs), medically appropriate exam (mental status, respiratory, cardiac, skin), discussed with treatment team, counseling and educating patient, family and staff, documenting clinical information, medication management and coordination of care.   Signed by: [name] Deed, DNP, AGNP-C Palliative Medicine    Please contact Palliative Medicine Team phone at [phone] for questions and concerns.  For individual provider: See Loretha Stapler

## 2023-11-23 NOTE — Progress Notes (Signed)
 Physical Therapy Treatment Patient Details Name: [name] Young. MRN: [id number] DOB: Apr 23, 1941 Today's Date: 11/23/2023   History of Present Illness 83 year old male patient with a past medical history of recurrent pleural effusion status post multiple thoracentesis thought to be transudate presenting to [facility] for an outpatient left heart cath and right heart cath.  He was found to have a complete whiteout of the right hemithorax suspicious for a hemothorax.     Patient underwent thoracentesis on 03/07 and drained 800 cc of serous fluid.    PT Comments  Patient is agreeable to PT session with encouragement. Patient was able to stand x 2 bouts with assistance using rolling walker. Standing activity tolerance limited by fatigue. Vitals stable throughout session. Recommend to continue PT to maximize independence and facilitate return to prior level of function.    If plan is discharge home, recommend the following: A lot of help with walking and/or transfers;A lot of help with bathing/dressing/bathroom;Assistance with cooking/housework;Assist for transportation;Help with stairs or ramp for entrance   Can travel by private vehicle     No  Equipment Recommendations  Rolling walker (2 wheels)    Recommendations for Other Services       Precautions / Restrictions Precautions Precautions: Fall Precaution/Restrictions Comments: chest tube to suction R flank Restrictions Weight Bearing Restrictions Per Provider Order: No     Mobility  Bed Mobility Overal bed mobility: Needs Assistance Bed Mobility: Supine to Sit, Sit to Supine     Supine to sit: Min assist Sit to supine: Min assist   General bed mobility comments: assistance for trunk support to sit upright. assistance for LE support to return to bed.    Transfers Overall transfer level: Needs assistance Equipment used: Rolling walker (2 wheels) Transfers: Sit to/from Stand Sit to Stand: Min assist           General  transfer comment: lifting assistance required for standing    Ambulation/Gait             Pre-gait activities: CGA for taking side steps along edge of bed. rolling walker for support with standing. activity tolerance limited by fatigue     Stairs             Wheelchair Mobility     Tilt Bed    Modified Rankin (Stroke Patients Only)       Balance Overall balance assessment: Needs assistance Sitting-balance support: Feet supported Sitting balance-Leahy Scale: Good     Standing balance support: Bilateral upper extremity supported Standing balance-Leahy Scale: Fair                              Hotel manager: No apparent difficulties  Cognition Arousal: Alert Behavior During Therapy: WFL for tasks assessed/performed                           PT - Cognition Comments: needs encouragement to participate at times. Following commands: Intact, Impaired Following commands impaired: Follows one step commands with increased time    Cueing Cueing Techniques: Verbal cues, Visual cues, Tactile cues  Exercises      General Comments        Pertinent Vitals/Pain Pain Assessment Pain Assessment: Faces Faces Pain Scale: Hurts little more Pain Location: back Pain Descriptors / Indicators: Discomfort Pain Intervention(s): Monitored during session, Limited activity within patient's tolerance, Repositioned    Home Living  Prior Function            PT Goals (current goals can now be found in the care plan section) Acute Rehab PT Goals Patient Stated Goal: control pain PT Goal Formulation: With patient Time For Goal Achievement: 12/04/23 Potential to Achieve Goals: Fair Progress towards PT goals: Progressing toward goals    Frequency    Min 2X/week      PT Plan      Co-evaluation              AM-PAC PT "6 Clicks" Mobility   Outcome Measure  Help needed  turning from your back to your side while in a flat bed without using bedrails?: A Little Help needed moving from lying on your back to sitting on the side of a flat bed without using bedrails?: A Little Help needed moving to and from a bed to a chair (including a wheelchair)?: A Lot Help needed standing up from a chair using your arms (e.g., wheelchair or bedside chair)?: A Lot Help needed to walk in hospital room?: Total Help needed climbing 3-5 steps with a railing? : Total 6 Click Score: 12    End of Session   Activity Tolerance: Patient limited by fatigue Patient left: in bed;with call bell/phone within reach Nurse Communication: Mobility status PT Visit Diagnosis: Muscle weakness (generalized) (M62.81);Difficulty in walking, not elsewhere classified (R26.2);Pain     Time: 8657-8469 PT Time Calculation (min) (ACUTE ONLY): 31 min  Charges:    $Therapeutic Activity: 23-37 mins PT General Charges $$ ACUTE PT VISIT: 1 Visit                    Donna Bernard, PT, MPT    Ina Homes 11/23/2023, 1:23 PM

## 2023-11-23 NOTE — Progress Notes (Signed)
 PHARMACY CONSULT NOTE - FOLLOW UP  Pharmacy Consult for Electrolyte Monitoring and Replacement   Recent Labs: Potassium (mmol/L)  Date Value  11/23/2023 4.8  02/01/2010 4.5   Magnesium (mg/dL)  Date Value  21/30/8657 3.0 (H)   Calcium (mg/dL)  Date Value  84/69/6295 8.4 (L)  02/01/2010 9.2   Albumin (g/dL)  Date Value  28/41/3244 2.1 (L)  09/17/2023 3.6 (L)   Phosphorus (mg/dL)  Date Value  09/13/7251 4.2   Sodium (mmol/L)  Date Value  11/23/2023 135  10/03/2023 141   Assessment: [name] Young is a 83 yo male who presented for surgery with a history significant for frustration due to recurrent shortness of breath. They have a history of fatigue and exertional dyspnea with minimal activity that improved following thoracentesis in January/Feburary. At a previous appointment, they asked for another thoracentesis to provide relief and asked about a more permanent solution like Pleurx catheter to help relieve their history of SOB. Pharmacy was consulted to manage this patient's electrolytes while they're in the ICU.   Goal of Therapy:  Electrolytes WNL  Plan:  No replacement indicated at this time Continue to follow electrolytes with AM labs  Effie Shy, PharmD Pharmacy Resident  11/23/2023 6:25 AM

## 2023-11-24 ENCOUNTER — Inpatient Hospital Stay

## 2023-11-24 DIAGNOSIS — I2510 Atherosclerotic heart disease of native coronary artery without angina pectoris: Secondary | ICD-10-CM | POA: Diagnosis not present

## 2023-11-24 DIAGNOSIS — D696 Thrombocytopenia, unspecified: Secondary | ICD-10-CM | POA: Diagnosis not present

## 2023-11-24 DIAGNOSIS — D72829 Elevated white blood cell count, unspecified: Secondary | ICD-10-CM | POA: Diagnosis not present

## 2023-11-24 DIAGNOSIS — J942 Hemothorax: Secondary | ICD-10-CM | POA: Diagnosis not present

## 2023-11-24 DIAGNOSIS — I5022 Chronic systolic (congestive) heart failure: Secondary | ICD-10-CM | POA: Diagnosis not present

## 2023-11-24 DIAGNOSIS — Z515 Encounter for palliative care: Secondary | ICD-10-CM | POA: Diagnosis not present

## 2023-11-24 DIAGNOSIS — J9601 Acute respiratory failure with hypoxia: Secondary | ICD-10-CM | POA: Diagnosis not present

## 2023-11-24 LAB — BASIC METABOLIC PANEL
Anion gap: 9 (ref 5–15)
BUN: 60 mg/dL — ABNORMAL HIGH (ref 8–23)
CO2: 31 mmol/L (ref 22–32)
Calcium: 7.6 mg/dL — ABNORMAL LOW (ref 8.9–10.3)
Chloride: 93 mmol/L — ABNORMAL LOW (ref 98–111)
Creatinine, Ser: 1.35 mg/dL — ABNORMAL HIGH (ref 0.61–1.24)
GFR, Estimated: 53 mL/min — ABNORMAL LOW (ref >60)
Glucose, Bld: 141 mg/dL — ABNORMAL HIGH (ref 70–99)
Potassium: 4.2 mmol/L (ref 3.5–5.1)
Sodium: 133 mmol/L — ABNORMAL LOW (ref 135–145)

## 2023-11-24 LAB — CBC
HCT: 28.4 % — ABNORMAL LOW (ref 39.0–52.0)
Hemoglobin: 8.7 g/dL — ABNORMAL LOW (ref 13.0–17.0)
MCH: 26.4 pg (ref 26.0–34.0)
MCHC: 30.6 g/dL (ref 30.0–36.0)
MCV: 86.3 fL (ref 80.0–100.0)
Platelets: 398 10*3/uL (ref 150–400)
RBC: 3.29 MIL/uL — ABNORMAL LOW (ref 4.22–5.81)
RDW: 18 % — ABNORMAL HIGH (ref 11.5–15.5)
WBC: 13 10*3/uL — ABNORMAL HIGH (ref 4.0–10.5)
nRBC: 0 % (ref 0.0–0.2)

## 2023-11-24 LAB — GLUCOSE, CAPILLARY
Glucose-Capillary: 137 mg/dL — ABNORMAL HIGH (ref 70–99)
Glucose-Capillary: 245 mg/dL — ABNORMAL HIGH (ref 70–99)
Glucose-Capillary: 145 mg/dL — ABNORMAL HIGH (ref 70–99)

## 2023-11-24 LAB — MAGNESIUM: Magnesium: 3 mg/dL — ABNORMAL HIGH (ref 1.7–2.4)

## 2023-11-24 MED ORDER — BISACODYL 10 MG RE SUPP: 10.0000 mg | Freq: Once | RECTAL | Status: DC

## 2023-11-24 MED ORDER — LACTATED RINGERS IV BOLUS
500.0000 mL | Freq: Once | INTRAVENOUS | Status: AC
  Administered 2023-11-24: 500 mL via INTRAVENOUS

## 2023-11-24 MED ORDER — SENNA 8.6 MG PO TABS
2.0000 | ORAL_TABLET | Freq: Every day | ORAL | Status: DC
  Administered 2023-11-24 – 2023-11-28 (×5): 17.2 mg via ORAL
  Filled 2023-11-24 (×5): qty 2

## 2023-11-24 NOTE — Progress Notes (Addendum)
 Progress Note    [name] Young.  NWG:[id number] DOB: 1941-04-19  DOA: 11/20/2023 PCP: [name], MD      Brief Narrative:    Medical records reviewed and are as summarized below:  [name] Young. is a 83 y.o. male  with medical history significant for chronic kidney disease stage III, BPH, HTN, DM2, paroxysmal atrial fibrillation (Eliquis), chronic diastolic heart failure with recurrent pleural effusions. He is familiar to IR from multiple thoracenteses with the last one on 11/16/23. 800 ml of yellow fluid was removed and post-procedure CXR was negative for a pneumothorax. Shortly after leaving the hospital he developed pleuritic right-sided chest pain that persisted.   He presented to the hospital on 11/20/2023 for right and left heart catheterization. Fluoroscopy of the right chest was performed before access was obtained and this showed a large right pleural effusion with near complete collapse of the right lung. The catheterization was cancelled and additional imaging obtained shows a hemothorax. His labs showed a 3 gm drop in hemoglobin.   He was admitted to the ICU for right hemothorax complicated by acute blood loss anemia.  Chest tube was placed in the right chest wall by interventional radiologist.  He was transfused 1 unit of PRBCs on 11/20/2023.  He was transferred from the ICU to [facility] on 11/22/2023.     Assessment/Plan:   Principal Problem:   Hemothorax Active Problems:   PAF (paroxysmal atrial fibrillation) (HCC)   Aortic valve stenosis   Hemothorax on right    Body mass index is 31.51 kg/m.  (Obesity)   Right hemothorax with complete collapse of right lung, recent right thoracentesis on 11/16/2023: S/p right chest tube placement with about 1.7 L of bloody output on 11/20/2023 by IR.   Analgesics as needed for pain.  He has been getting intrapleural tPA. Continue IV Zosyn for suspected empyema.  Pleural fluid culture is  pending. Repeat chest x-ray on 11/24/2023 showed trace right pleural effusion, decreased since prior study.   Acute blood loss anemia: Overall, hemoglobin improved.  Hemoglobin is 8.7, down from 9.4.  S/p transfusion 1 unit of PRBCs on 11/20/2023 for hemoglobin of 7.2.   AKI: Improved. Creatinine stable at 1.35. Creatinine was 1.71 on admission.    Acute hypoxic respiratory failure: Improved.  He is tolerating room air.   Paroxysmal atrial fibrillation, nonsustained VT, frequent PVCs on telemetry: Continue metoprolol.  Eliquis has been held.   Chronic diastolic CHF, CAD, moderate aortic valve stenosis compensated. Lasix and metolazone on hold 2D echo showed EF estimated at 40 to 45%, grade 1 diastolic dysfunction, mild LVH, mild MR, mild AR, mild to moderate AAS. 2D echo in April 2024 showed EF estimated at 50 to 55%.   General Weakness: PT and OT recommended SNF at discharge.   Constipation: Patient was able to move his bowels later in the day.  Laxatives as needed.   Transfer from stepdown unit to progressive care unit  Plan discussed with Judeth Cornfield, RN, at the bedside  Diet Order             Diet heart healthy/carb modified Room service appropriate? Yes; Fluid consistency: Thin  Diet effective now                            Consultants: Interventional radiologist Cardiologist Pulmonologist  Procedures: Right chest tube placement on 11/20/2023    Medications:    sodium chloride  Intravenous Once   acetaminophen  650 mg Oral Q6H   Chlorhexidine Gluconate Cloth  6 each Topical Daily   insulin aspart  0-15 Units Subcutaneous TID WC   insulin aspart  0-5 Units Subcutaneous QHS   metoprolol tartrate  25 mg Oral BID   mirtazapine  15 mg Oral QHS   polyethylene glycol  17 g Oral Daily   rosuvastatin  10 mg Oral Daily   senna  2 tablet Oral QHS   Continuous Infusions:  piperacillin-tazobactam (ZOSYN)  IV 3.375 g (11/24/23 0952)      Anti-infectives (From admission, onward)    Start     Dose/Rate Route Frequency Ordered Stop   11/23/23 1000  piperacillin-tazobactam (ZOSYN) IVPB 3.375 g        3.375 g 12.5 mL/hr over 240 Minutes Intravenous Every 8 hours 11/23/23 0908                Family Communication/Anticipated D/C date and plan/Code Status   DVT prophylaxis: SCDs Start: 11/20/23 1353     Code Status: Full Code  Family Communication: Plan discussed with his wife at the bedside Disposition Plan: Plan to discharge to SNF   Status is: Inpatient Remains inpatient appropriate because: Right-sided hemithorax       Subjective:   Interval events noted.  He feels better today.  He complains of constipation.  He said he had not moved his bowels since admission.  No shortness of breath, abdominal pain or vomiting.  Objective:    Vitals:   11/24/23 0800 11/24/23 0948 11/24/23 1000 11/24/23 1200  BP:  111/65 (!) 115/57 (!) 96/50  Pulse:  79 (!) 36 70  Resp:   (!) 22 20  Temp: (!) 97.5 F (36.4 C)   (!) 95.8 F (35.4 C)  TempSrc: Axillary   Axillary  SpO2:   96% 96%  Weight:      Height:       No data found.   Intake/Output Summary (Last 24 hours) at 11/24/2023 1333 Last data filed at 11/24/2023 1300 Gross per 24 hour  Intake 280.13 ml  Output 1481 ml  Net -1200.87 ml   Filed Weights   11/22/23 0347 11/23/23 0400 11/24/23 0224  Weight: 101.7 kg 99.5 kg 99.6 kg    Exam:  GEN: NAD SKIN: Warm and dry EYES: No pallor or icterus ENT: MMM CV: RRR PULM: Chest tube in the right lateral chest wall.  Decreased air entry on the right hemithorax ABD: soft, obese, NT, +BS CNS: AAO x 3, non focal EXT: No edema or tenderness    Data Reviewed:   I have personally reviewed following labs and imaging studies:  Labs: Labs show the following:   Basic Metabolic Panel: Recent Labs  Lab 11/20/23 1220 11/21/23 0239 11/22/23 0330 11/22/23 1450 11/23/23 0407 11/24/23 0404  NA  133* 134* 135  --  135 133*  K 4.6 4.2 4.4 4.3 4.8 4.2  CL 91* 94* 95*  --  94* 93*  CO2 32 31 31  --  29 31  GLUCOSE 202* 150* 145*  --  152* 141*  BUN 75* 67* 62*  --  62* 60*  CREATININE 1.71* 1.45* 1.27*  --  1.29* 1.35*  CALCIUM 8.1* 7.8* 7.7*  --  8.4* 7.6*  MG  --  3.1* 3.0* 3.0* 3.0* 3.0*  PHOS  --  4.3 4.2  --   --   --    GFR Estimated Creatinine Clearance: 50.7 mL/min (A) (by C-G  formula based on SCr of 1.35 mg/dL (H)). Liver Function Tests: Recent Labs  Lab 11/20/23 1220 11/21/23 0239 11/22/23 0330  AST 37  --   --   ALT 38  --   --   ALKPHOS 68  --   --   BILITOT 0.6  --   --   PROT 7.0  --   --   ALBUMIN 2.4* 2.2* 2.1*   No results for input(s): "LIPASE", "AMYLASE" in the last 168 hours. No results for input(s): "AMMONIA" in the last 168 hours. Coagulation profile Recent Labs  Lab 11/21/23 0902  INR 1.4*    CBC: Recent Labs  Lab 11/20/23 1220 11/20/23 1824 11/21/23 0239 11/21/23 1326 11/22/23 0330 11/23/23 0006 11/23/23 0406 11/23/23 0407 11/24/23 0404  WBC 13.5*   < > 11.8*   < > 12.1* 16.6* 14.5* 14.7* 13.0*  NEUTROABS 11.4*  --  8.6*  --   --   --  11.2*  --   --   HGB 7.2*   < > 8.1*   < > 8.3* 8.9* 9.4* 9.4* 8.7*  HCT 23.7*   < > 25.9*   < > 27.2* 28.4* 30.8* 30.3* 28.4*  MCV 86.8   < > 86.3   < > 86.1 84.0 87.3 84.6 86.3  PLT 363   < > 345   < > 379 452* 451* 438* 398   < > = values in this interval not displayed.   Cardiac Enzymes: No results for input(s): "CKTOTAL", "CKMB", "CKMBINDEX", "TROPONINI" in the last 168 hours. BNP (last 3 results) Recent Labs    12/21/22 1455 04/25/23 1516  PROBNP 357.0* 142.0*   CBG: Recent Labs  Lab 11/23/23 1142 11/23/23 1536 11/23/23 2143 11/24/23 0745 11/24/23 1116  GLUCAP 225* 167* 147* 137* 245*   D-Dimer: No results for input(s): "DDIMER" in the last 72 hours. Hgb A1c: No results for input(s): "HGBA1C" in the last 72 hours. Lipid Profile: No results for input(s): "CHOL", "HDL",  "LDLCALC", "TRIG", "CHOLHDL", "LDLDIRECT" in the last 72 hours. Thyroid function studies: No results for input(s): "TSH", "T4TOTAL", "T3FREE", "THYROIDAB" in the last 72 hours.  Invalid input(s): "FREET3" Anemia work up: No results for input(s): "VITAMINB12", "FOLATE", "FERRITIN", "TIBC", "IRON", "RETICCTPCT" in the last 72 hours. Sepsis Labs: Recent Labs  Lab 11/23/23 0006 11/23/23 0406 11/23/23 0407 11/24/23 0404  WBC 16.6* 14.5* 14.7* 13.0*    Microbiology Recent Results (from the past 240 hours)  Body fluid culture w Gram Stain     Status: None   Collection Time: 11/16/23 11:07 AM   Specimen: PATH Cytology Pleural fluid  Result Value Ref Range Status   Specimen Description   Final    PLEURAL Performed at [facility], [address] [place], [place] [address]    Special Requests   Final    NONE Performed at [facility], [address] [place], [place] [address]    Gram Stain NO WBC SEEN NO ORGANISMS SEEN   Final   Culture   Final    NO GROWTH 3 DAYS Performed at [facility], [address] [place], [place] [address]    Report Status 11/19/2023 FINAL  Final  Body fluid culture w Gram Stain     Status: None (Preliminary result)   Collection Time: 11/23/23  9:10 AM   Specimen: Pleura; Body Fluid  Result Value Ref Range Status   Specimen Description   Final    PLEURAL Performed at [facility],  [address] [place], [place] [address]    Special Requests   Final    NONE Performed at [facility], [address] [place], [place] [address]    Gram Stain   Final    FEW WBC PRESENT, PREDOMINANTLY PMN NO ORGANISMS SEEN    Culture   Final    NO GROWTH < 24 HOURS Performed at [facility], [address] [place], [place] [address]    Report Status PENDING  Incomplete  MRSA Next Gen by PCR, Nasal     Status: None   Collection Time: 11/23/23  2:18 PM   Specimen: Nasal Mucosa; Nasal Swab  Result Value  Ref Range Status   MRSA by PCR Next Gen NOT DETECTED NOT DETECTED Final    Comment: (NOTE) The GeneXpert MRSA Assay (FDA approved for NASAL specimens only), is one component of a comprehensive MRSA colonization surveillance program. It is not intended to diagnose MRSA infection nor to guide or monitor treatment for MRSA infections. Test performance is not FDA approved in patients less than 20 years old. Performed at [facility], [address] [place], [place] [address]   Expectorated Sputum Assessment w Gram Stain, Rflx to Resp Cult     Status: None   Collection Time: 11/23/23  6:17 PM   Specimen: Sputum  Result Value Ref Range Status   Specimen Description SPU EXPECTORATED SPUTUM  Final   Special Requests NONE  Final   Sputum evaluation   Final    THIS SPECIMEN IS ACCEPTABLE FOR SPUTUM CULTURE Performed at [facility], [address] [place], [place] [address]    Report Status 11/23/2023 FINAL  Final    Procedures and diagnostic studies:  [facility] Chest Port 1 View Result Date: 11/24/2023 CLINICAL DATA:  Pleural effusion. EXAM: PORTABLE CHEST 1 VIEW COMPARISON:  11/23/2023. FINDINGS: There is trace right pleural effusion, which appears decreased in size since the prior study. Right pleural drainage catheter and trace right pleural effusion are again seen. There is linear area of atelectasis/scarring at the left lung base, laterally. Bilateral lung fields are otherwise clear. No acute consolidation or lung collapse. No pneumothorax on either side. Stable cardio-mediastinal silhouette. There are surgical staples along the heart border and sternotomy wires, status post CABG (coronary artery bypass graft). No acute osseous abnormalities. The soft tissues are within normal limits. IMPRESSION: Trace right pleural effusion, decreased since the prior study. Electronically Signed   By: Jules Schick M.D.   On: 11/24/2023 12:06   CT CHEST WO CONTRAST Result Date:  11/23/2023 CLINICAL DATA:  Pleural effusion chest tube EXAM: CT CHEST WITHOUT CONTRAST TECHNIQUE: Multidetector CT imaging of the chest was performed following the standard protocol without IV contrast. RADIATION DOSE REDUCTION: This exam was performed according to the departmental dose-optimization program which includes automated exposure control, adjustment of the mA and/or kV according to patient size and/or use of iterative reconstruction technique. COMPARISON:  Chest x-ray 11/23/2023, CT chest 11/20/2023, 05/16/2023 FINDINGS: Cardiovascular: Limited evaluation without intravenous contrast. Moderate aortic atherosclerosis. No aneurysm. Coronary vascular calcification. Borderline to mild cardiomegaly. No pericardial effusion. Post CABG changes. Mediastinum/Nodes: Patent trachea. No thyroid mass. No suspicious lymph nodes. Esophagus within normal limits. Lungs/Pleura: Mild subpleural reticulation. Bandlike densities in the right upper and lower lobes which may be due to atelectasis or pulmonary scarring. Interim placement of right lateral percutaneous drainage catheter with pigtail at the right posterior lung base. Significant interval decrease in right-sided pleural effusion compared to the  prior chest CT. Moderate air with fluid level in the lateral pleural space consistent with pneumothorax. Decreased right lung consolidations compared to prior consistent with improved atelectasis. Hyperdense masses along the pleural surface. Lobulated hyperdensity along the right middle lobe pleural surface measures 4.2 by 2 cm. Dominant hyperdense pleural density within the posterior right lower lobe measures about 12.3 x 3.9 cm, overall decreased in size compared with the prior chest CT. Upper Abdomen: Small volume perihepatic free fluid. Suspicion of small organized fluid collection along the right posterior liver margin, for example series 2, image 156, measuring about 5.5 x 3 cm Musculoskeletal: Sternotomy. Multilevel  degenerative osteophytes. No acute osseous abnormality IMPRESSION: 1. Interim placement of right lateral percutaneous drainage catheter with pigtail at the right posterior lung base. Significant interval decrease in size of complex right-sided pleural effusion compared to the prior chest CT. Moderate air with fluid level in the lateral pleural space consistent with hydro/hematohydropneumothorax 2. Decreased right lung consolidations compared to prior consistent with improved atelectasis and overall better aeration of the right lung parenchyma. 3. Hyperdense masses along the right pleural surface as described above, overall decreased in size compared with the prior chest CT. Findings may be due to pleural hemorrhage/hematoma but pleural mass not excluded and continued CT follow-up is recommended to ensure resolution. 4. Small volume perihepatic free fluid. Suspicion of small organized fluid collection along the right posterior liver margin measuring about 5.5 x 3 cm. This is indeterminate for infected fluid collection or organizing hematoma, further assessment limited without contrast 5. Aortic atherosclerosis. Aortic Atherosclerosis (ICD10-I70.0). Electronically Signed   By: Jasmine Pang M.D.   On: 11/23/2023 16:54   DG Chest Port 1 View Result Date: 11/23/2023 CLINICAL DATA:  Pleural effusion. EXAM: PORTABLE CHEST 1 VIEW COMPARISON:  November 22, 2023. FINDINGS: Stable cardiomediastinal silhouette. Sternotomy wires are noted. Stable right-sided pleural drainage catheter. No pneumothorax. Stable bibasilar atelectasis with small pleural effusions. IMPRESSION: Stable right-sided chest tube. Stable bilateral pleural effusions with associated subsegmental atelectasis. Electronically Signed   By: Lupita Raider M.D.   On: 11/23/2023 09:05               LOS: 4 days   Eris Breck  [facility]   Pager on [email or web address]. If 7PM-7AM, please contact night-coverage at  [email or web address]     11/24/2023, 1:33 PM

## 2023-11-24 NOTE — Plan of Care (Signed)
  Problem: Activity: Goal: Ability to return to baseline activity level will improve Outcome: Progressing   Problem: Cardiovascular: Goal: Ability to achieve and maintain adequate cardiovascular perfusion will improve Outcome: Progressing   Problem: Education: Goal: Knowledge of General Education information will improve Description: Including pain rating scale, medication(s)/side effects and non-pharmacologic comfort measures Outcome: Progressing   Problem: Clinical Measurements: Goal: Respiratory complications will improve Outcome: Progressing   Problem: Activity: Goal: Risk for activity intolerance will decrease Outcome: Progressing   Problem: Nutrition: Goal: Adequate nutrition will be maintained Outcome: Progressing   Problem: Coping: Goal: Level of anxiety will decrease Outcome: Progressing   Problem: Elimination: Goal: Will not experience complications related to bowel motility Outcome: Progressing   Problem: Pain Managment: Goal: General experience of comfort will improve and/or be controlled Outcome: Progressing   Problem: Safety: Goal: Ability to remain free from injury will improve Outcome: Progressing

## 2023-11-24 NOTE — Progress Notes (Signed)
 Daily Progress Note   Patient Name: [name] Young.       Date: 11/24/2023 DOB: 09-Dec-1940  Age: 83 y.o. MRN#: [id number] Attending Physician: Lurene Shadow, MD Primary Care Physician: [name], MD Admit Date: 11/20/2023  Reason for Consultation/Follow-up: Establishing goals of care  HPI/Brief Hospital Review: 83 y.o. male  with past medical history of recurrent pleural effusions status post multiple thoracenteses, CAD status post CABG in 2019, paroxysmal A-fib, diabetes and CKD admitted on 11/20/2023 scheduled for outpatient heart catheterization which was ultimately canceled due to concern for large right hemothorax.  Noted to have underwent thoracentesis on 3 7 with removal of 800 cc of fluid and had been complaining of right sided chest pain with worsening shortness of breath since procedure.   Chest tube placed by IR on 3/11, since insertion there has been improvement on chest x-ray   Palliative medicine was consulted for assisting with goals of care conversations.  Subjective: Extensive chart review has been completed prior to meeting patient including labs, vital signs, imaging, progress notes, orders, and available advanced directive documents from current and previous encounters.    Visited with [name] Young at his bedside. He is resting in bed but awakens and acknowledges my presence in room. He shares he is feeling better today, back pain with cough has improved, rested well overnight and ate majority of his breakfast.  Wife arrives to bedside during our visit. Reviewed I/O and confirmed by his wife he was not able to have BM yesterday. Added senna to regimen. She shares over the last few months his bowel regimen has changed and now typically only has a BM once a week as  he has been less mobile and does not drink as much fluid. Wife shares daughter was able to visit at bedside yesterday and feels good about plan and goals of care.  Answered and addressed all questions and concerns. PMT to continue to follow for ongoing needs and support.  Objective:  Physical Exam Constitutional:      General: He is not in acute distress. Pulmonary:     Effort: Pulmonary effort is normal. No respiratory distress.  Skin:    General: Skin is warm and dry.  Neurological:     Mental Status: He is alert and oriented to person, place, and time.  Motor: Weakness present.             Vital Signs: BP (!) 101/57   Pulse 70   Temp (!) 95.8 F (35.4 C) (Axillary)   Resp (!) 24   Ht 5\' 10"  (1.778 m)   Wt 99.6 kg   SpO2 95%   BMI 31.51 kg/m  SpO2: SpO2: 95 % O2 Device: O2 Device: Room Air O2 Flow Rate: O2 Flow Rate (L/min): 2 L/min   Palliative Care Assessment & Plan   Assessment/Recommendation/Plan  Continue with current plan of care Add senna daily to bowel regimen  Care plan was discussed with nursing staff  Thank you for allowing the Palliative Medicine Team to assist in the care of this patient.  Total time:  35 minutes  Time spent includes: Detailed review of medical records (labs, imaging, vital signs), medically appropriate exam (mental status, respiratory, cardiac, skin), discussed with treatment team, counseling and educating patient, family and staff, documenting clinical information, medication management and coordination of care.  Leeanne Deed, DNP, AGNP-C Palliative Medicine   Please contact Palliative Medicine Team phone at [phone] for questions and concerns.

## 2023-11-24 NOTE — Progress Notes (Signed)
 Patient transferred to 2A with this RN. VSS. All personal belongings kept at bedside transferred with patient.

## 2023-11-24 NOTE — Progress Notes (Signed)
 PHARMACY CONSULT NOTE - FOLLOW UP  Pharmacy Consult for Electrolyte Monitoring and Replacement   Recent Labs: Potassium (mmol/L)  Date Value  11/24/2023 4.2  02/01/2010 4.5   Magnesium (mg/dL)  Date Value  11/91/4782 3.0 (H)   Calcium (mg/dL)  Date Value  95/62/1308 7.6 (L)  02/01/2010 9.2   Albumin (g/dL)  Date Value  65/78/4696 2.1 (L)  09/17/2023 3.6 (L)   Phosphorus (mg/dL)  Date Value  29/52/8413 4.2   Sodium (mmol/L)  Date Value  11/24/2023 133 (L)  10/03/2023 141   Assessment: [name] Young is a 83 yo male who presented for surgery with a history significant for frustration due to recurrent shortness of breath. They have a history of fatigue and exertional dyspnea with minimal activity that improved following thoracentesis in January/Feburary. At a previous appointment, they asked for another thoracentesis to provide relief and asked about a more permanent solution like Pleurx catheter to help relieve their history of SOB. Pharmacy was consulted to manage this patient's electrolytes while they're in the ICU.   Goal of Therapy:  Electrolytes WNL  Plan:  No replacement indicated at this time Magnesium remains elevated throughout this admission Continue to follow electrolytes with AM labs  Effie Shy, PharmD Pharmacy Resident  11/24/2023 7:18 AM

## 2023-11-24 NOTE — Progress Notes (Signed)
 NAME:  [name] Young., MRN:  [id number], DOB:  November 20, 1940, LOS: 4 ADMISSION DATE:  11/20/2023  History of Present Illness:  [name] is a pleasant 83 year old male patient with a past medical history of recurrent pleural effusion status post multiple thoracentesis thought to be transudate presenting to [facility] for an outpatient left heart cath and right heart cath.  He was found to have a complete whiteout of the right hemithorax suspicious for a hemothorax.  Patient underwent thoracentesis on 03/07 and drained 800 cc of serous fluid.  And since then he has been complaining of sharp right-sided chest pain associated with worsening shortness of breath.  He presents today for a left heart cath and was noted on fluoroscopy to have a near complete whiteout of the right hemithorax.  Therefore left heart cath was canceled.  Labs were noticeable for drop in hemoglobin from 10 g/dL at baseline to 7.1 g/dL at baseline.  A CT scan without contrast of the chest was obtained which showed a large right pleural effusion with heterogeneous densities highly suspicious for a hemothorax.  IR graciously placed a right 19 [place] skater with 1.8 L of serosanguineous fluid drained.  He remained hemodynamically throughout procedure.  Pertinent  Medical History  As above  Significant Hospital Events: Including procedures, antibiotic start and stop dates in addition to other pertinent events   11/20/2023 Admitted to the ICU s/p right 16 Fr Skater. 1.8L Serosanguinous fluid drained.  11/21/23: Tpa 10mg  Given intrapleural, output was 1800cc yesterday of sanguinous material. CXR appeared improved.   Subjective:  Feels significantly improved.  CT chest with improved Right effusion.   Remains with good output - 1.5L in the past 24hrs. 300 cc since this am.   Objective   Blood pressure 111/65, pulse 79, temperature (!) 97.5 F (36.4 C), temperature source Axillary, resp. rate (!) 26, height 5\' 10"  (1.778 m),  weight 99.6 kg, SpO2 95%.        Intake/Output Summary (Last 24 hours) at 11/24/2023 1148 Last data filed at 11/24/2023 0730 Gross per 24 hour  Intake 280.13 ml  Output 1435 ml  Net -1154.87 ml   Filed Weights   11/22/23 0347 11/23/23 0400 11/24/23 0224  Weight: 101.7 kg 99.5 kg 99.6 kg    Examination: General: Alert awake  and oriented. HD stable.  HENT: Supple neck reactive pupils Lungs: Improved breath sounds over the left hemithorax.  Cardiovascular: Normal S1, Normal S2, RRR, no murmurs or ES appreciated Abdomen: Soft, non tender, non distended, +BS  Extremities: Warm and well perfused.   Labs and imaging were reviewed.   Assessment & Plan:  [name] is a pleasant 83 year old male patient with a past medical history of recurrent pleural effusion status post multiple thoracentesis thought to be transudate presenting to [facility] for an outpatient left heart cath and right heart cath.  He was found to have a complete whiteout of the right hemithorax suspicious for a hemothorax.  #Acute hypoxic respiratory failure secondary to...  #Large right hemothorax in the setting of recent thoracentesis on 11/16/2023 while being on Eliquis. S/p 16Fr Skater placement on 03/11. Appears to be venous bleed given hemodynamic stability and lack of ongoing pleural drainage.  #AKI like prerenal in the setting acute anemia  #Acute Anemia in the setting of the above.  #Paroxysmal A.fib on eliquis.   Improved overall however remain with significant drainage of 300cc. CXR this am with improved aeration in the right lower lobe.  []  CT to suction  at [facility].  []  Should he show any worsening output, hemodynamic instability or drop in hemogloibin, CTA chest should be obtained and IR should be contacted for possible embolization. If that is not feasible than [facility] after [facility] for thoracic surgery eval should be pursued.  []  if output decreases than plan to dc chest tube tomorrow or  Monday. []  Sent ANA RF and CCP as fluid is exudative.  []  Daily CBC  []  Fluid for culutre and cell count  []  Culture megative so far. Procal in the am.   Pulmonary will follow closely.  Please do not hesitate to reach out with any concerns.   I spent 40 minutes caring for this patient today, including preparing to see the patient, obtaining a medical history , reviewing a separately obtained history, performing a medically appropriate examination and/or evaluation, counseling and educating the patient/family/caregiver, ordering medications, tests, or procedures, documenting clinical information in the electronic health record, and independently interpreting results (not separately reported/billed) and communicating results to the patient/family/caregiver  Janann Colonel, MD  Pulmonary Critical Care 11/24/2023 11:48 AM

## 2023-11-25 ENCOUNTER — Inpatient Hospital Stay

## 2023-11-25 DIAGNOSIS — D72829 Elevated white blood cell count, unspecified: Secondary | ICD-10-CM | POA: Diagnosis not present

## 2023-11-25 DIAGNOSIS — Z515 Encounter for palliative care: Secondary | ICD-10-CM | POA: Diagnosis not present

## 2023-11-25 DIAGNOSIS — D696 Thrombocytopenia, unspecified: Secondary | ICD-10-CM | POA: Diagnosis not present

## 2023-11-25 DIAGNOSIS — I2510 Atherosclerotic heart disease of native coronary artery without angina pectoris: Secondary | ICD-10-CM | POA: Diagnosis not present

## 2023-11-25 DIAGNOSIS — J942 Hemothorax: Secondary | ICD-10-CM | POA: Diagnosis not present

## 2023-11-25 DIAGNOSIS — J9601 Acute respiratory failure with hypoxia: Secondary | ICD-10-CM | POA: Diagnosis not present

## 2023-11-25 DIAGNOSIS — I5022 Chronic systolic (congestive) heart failure: Secondary | ICD-10-CM | POA: Diagnosis not present

## 2023-11-25 LAB — CBC
HCT: 26.1 % — ABNORMAL LOW (ref 39.0–52.0)
Hemoglobin: 8.1 g/dL — ABNORMAL LOW (ref 13.0–17.0)
MCH: 26.6 pg (ref 26.0–34.0)
MCHC: 31 g/dL (ref 30.0–36.0)
MCV: 85.6 fL (ref 80.0–100.0)
Platelets: 391 10*3/uL (ref 150–400)
RBC: 3.05 MIL/uL — ABNORMAL LOW (ref 4.22–5.81)
RDW: 18.6 % — ABNORMAL HIGH (ref 11.5–15.5)
WBC: 11.8 10*3/uL — ABNORMAL HIGH (ref 4.0–10.5)
nRBC: 0 % (ref 0.0–0.2)

## 2023-11-25 LAB — RENAL FUNCTION PANEL
Albumin: 1.9 g/dL — ABNORMAL LOW (ref 3.5–5.0)
Anion gap: 12 (ref 5–15)
BUN: 61 mg/dL — ABNORMAL HIGH (ref 8–23)
CO2: 30 mmol/L (ref 22–32)
Calcium: 7.6 mg/dL — ABNORMAL LOW (ref 8.9–10.3)
Chloride: 93 mmol/L — ABNORMAL LOW (ref 98–111)
Creatinine, Ser: 1.38 mg/dL — ABNORMAL HIGH (ref 0.61–1.24)
GFR, Estimated: 51 mL/min — ABNORMAL LOW (ref >60)
Glucose, Bld: 128 mg/dL — ABNORMAL HIGH (ref 70–99)
Phosphorus: 4.5 mg/dL (ref 2.5–4.6)
Potassium: 3.8 mmol/L (ref 3.5–5.1)
Sodium: 135 mmol/L (ref 135–145)

## 2023-11-25 LAB — GLUCOSE, CAPILLARY
Glucose-Capillary: 127 mg/dL — ABNORMAL HIGH (ref 70–99)
Glucose-Capillary: 211 mg/dL — ABNORMAL HIGH (ref 70–99)
Glucose-Capillary: 216 mg/dL — ABNORMAL HIGH (ref 70–99)
Glucose-Capillary: 140 mg/dL — ABNORMAL HIGH (ref 70–99)

## 2023-11-25 LAB — RHEUMATOID FACTOR: Rheumatoid fact SerPl-aCnc: 11.8 [IU]/mL (ref <14.0)

## 2023-11-25 LAB — MAGNESIUM: Magnesium: 3 mg/dL — ABNORMAL HIGH (ref 1.7–2.4)

## 2023-11-25 LAB — PROCALCITONIN: Procalcitonin: 0.32 ng/mL

## 2023-11-25 MED ORDER — METOLAZONE 2.5 MG PO TABS
2.5000 mg | ORAL_TABLET | Freq: Every day | ORAL | Status: DC
  Administered 2023-11-25 – 2023-11-26 (×2): 2.5 mg via ORAL
  Filled 2023-11-25 (×2): qty 1

## 2023-11-25 MED ORDER — FUROSEMIDE 40 MG PO TABS
40.0000 mg | ORAL_TABLET | Freq: Every day | ORAL | Status: DC
  Administered 2023-11-25 – 2023-11-29 (×5): 40 mg via ORAL
  Filled 2023-11-25 (×5): qty 1

## 2023-11-25 NOTE — Progress Notes (Signed)
                                                                                                                                                                                                           Daily Progress Note   Patient Name: [name] Young.       Date: 11/25/2023 DOB: March 05, 1941  Age: 83 y.o. MRN#: [id number] Attending Physician: Lurene Shadow, MD Primary Care Physician: [name], MD Admit Date: 11/20/2023  Reason for Consultation/Follow-up: Establishing goals of care  HPI/Brief Hospital Review: 83 y.o. male  with past medical history of recurrent pleural effusions status post multiple thoracenteses, CAD status post CABG in 2019, paroxysmal A-fib, diabetes and CKD admitted on 11/20/2023 scheduled for outpatient heart catheterization which was ultimately canceled due to concern for large right hemothorax.  Noted to have underwent thoracentesis on 3 7 with removal of 800 cc of fluid and had been complaining of right sided chest pain with worsening shortness of breath since procedure.   Chest tube placed by IR on 3/11, since insertion there has been improvement on chest x-ray   Palliative medicine was consulted for assisting with goals of care conversations.  Subjective: Extensive chart review has been completed prior to meeting patient including labs, vital signs, imaging, progress notes, orders, and available advanced directive documents from current and previous encounters.    Visited with [name] Young at his bedside. He is resting in bed comfortably, does not acknowledge my presence in room. Wife at bedside during time of visit.  Wife shares [name] Young had a good night and morning so far. He was able to have a BM yesterday. Back pain also seems to have improved. She shares plan to potentially remove chest tube in the next day or so and she is hopeful for d/c home after that.  No ongoing acute palliative needs at this time as goals/plans are clear. PMT will step  away from daily visits but will remain available if needs or concerns arise  Thank you for allowing the Palliative Medicine Team to assist in the care of this patient.  Total time:  25 minutes  Time spent includes: Detailed review of medical records (labs, imaging, vital signs), medically appropriate exam (mental status, respiratory, cardiac, skin), discussed with treatment team, counseling and educating patient, family and staff, documenting clinical information, medication management and coordination of care.  Leeanne Deed, DNP, AGNP-C Palliative Medicine   Please contact Palliative Medicine Team phone at [phone] for questions and concerns.

## 2023-11-25 NOTE — Plan of Care (Signed)
  Problem: Activity: Goal: Ability to return to baseline activity level will improve Outcome: Progressing   Problem: Education: Goal: Knowledge of General Education information will improve Description: Including pain rating scale, medication(s)/side effects and non-pharmacologic comfort measures Outcome: Progressing   Problem: Health Behavior/Discharge Planning: Goal: Ability to manage health-related needs will improve Outcome: Progressing   Problem: Clinical Measurements: Goal: Ability to maintain clinical measurements within normal limits will improve Outcome: Progressing Goal: Cardiovascular complication will be avoided Outcome: Progressing

## 2023-11-25 NOTE — Progress Notes (Signed)
 NAME:  [name] Young., MRN:  [id number], DOB:  Feb 11, 1941, LOS: 5 ADMISSION DATE:  11/20/2023  History of Present Illness:  [name] is a pleasant 83 year old male patient with a past medical history of recurrent pleural effusion status post multiple thoracentesis thought to be transudate presenting to [facility] for an outpatient left heart cath and right heart cath.  He was found to have a complete whiteout of the right hemithorax suspicious for a hemothorax.  Patient underwent thoracentesis on 03/07 and drained 800 cc of serous fluid.  And since then he has been complaining of sharp right-sided chest pain associated with worsening shortness of breath.  He presents today for a left heart cath and was noted on fluoroscopy to have a near complete whiteout of the right hemithorax.  Therefore left heart cath was canceled.  Labs were noticeable for drop in hemoglobin from 10 g/dL at baseline to 7.1 g/dL at baseline.  A CT scan without contrast of the chest was obtained which showed a large right pleural effusion with heterogeneous densities highly suspicious for a hemothorax.  IR graciously placed a right 13 [place] skater with 1.8 L of serosanguineous fluid drained.  He remained hemodynamically throughout procedure.  Pertinent  Medical History  As above  Significant Hospital Events: Including procedures, antibiotic start and stop dates in addition to other pertinent events   11/20/2023 Admitted to the ICU s/p right 16 Fr Skater. 1.8L Serosanguinous fluid drained.  11/21/23: Tpa 10mg  Given intrapleural, output was 1800cc yesterday of sanguinous material. CXR appeared improved.  11/25/2023 Chest tube remains with significant output now at 800cc per 24hrs.   Subjective:  Feels significantly improved.  CT chest with improved Right effusion.   Remains with good output at 800cc per 24hrs, pleural fluid color is now more serous.   Objective   Blood pressure 125/64, pulse 81, temperature 98.3  F (36.8 C), temperature source Oral, resp. rate (!) 22, height 5\' 10"  (1.778 m), weight 102.6 kg, SpO2 92%.        Intake/Output Summary (Last 24 hours) at 11/25/2023 1219 Last data filed at 11/25/2023 0600 Gross per 24 hour  Intake 30 ml  Output 721 ml  Net -691 ml   Filed Weights   11/23/23 0400 11/24/23 0224 11/25/23 0500  Weight: 99.5 kg 99.6 kg 102.6 kg    Examination: General: Alert awake  and oriented. HD stable.  HENT: Supple neck reactive pupils Lungs: Improved breath sounds over the left hemithorax.  Cardiovascular: Normal S1, Normal S2, RRR, no murmurs or ES appreciated Abdomen: Soft, non tender, non distended, +BS  Extremities: Warm and well perfused.   Labs and imaging were reviewed.   Assessment & Plan:  [name] is a pleasant 83 year old male patient with a past medical history of recurrent pleural effusion status post multiple thoracentesis thought to be transudate presenting to [facility] for an outpatient left heart cath and right heart cath.  He was found to have a complete whiteout of the right hemithorax suspicious for a hemothorax.  #Acute hypoxic respiratory failure secondary to...  #Large right hemothorax in the setting of recent thoracentesis on 11/16/2023 while being on Eliquis. S/p 16Fr Skater placement on 03/11. Appears to be venous bleed given hemodynamic stability and lack of ongoing pleural drainage.  #AKI like prerenal in the setting acute anemia  #Acute Anemia in the setting of the above.  #Paroxysmal A.fib on eliquis.   Improved overall however remain with significant drainage of 300cc. CXR this am with  improved aeration in the right lower lobe.  []  CT to suction at -20cmH2O. Place on waterseal in the am.  []  Should he show any worsening output, hemodynamic instability or drop in hemogloibin, CTA chest should be obtained and IR should be contacted for possible embolization. If that is not feasible than [facility] after [facility] for  thoracic surgery eval should be pursued.  []  if output decreases than plan to dc chest tube tomorrow or Monday. []  Restart home diuretics []  PendingANA RF and CCP as fluid is exudative.  []  Daily CBC  []  Fluid for culutre and cell count no growth todate []  Culture megative so far. Procal in the am.   Pulmonary will follow closely.  Please do not hesitate to reach out with any concerns.   I spent 40 minutes caring for this patient today, including preparing to see the patient, obtaining a medical history , reviewing a separately obtained history, performing a medically appropriate examination and/or evaluation, counseling and educating the patient/family/caregiver, ordering medications, tests, or procedures, documenting clinical information in the electronic health record, and independently interpreting results (not separately reported/billed) and communicating results to the patient/family/caregiver  Janann Colonel, MD [place] [facility] Critical [facility] 11/25/2023 12:19 PM

## 2023-11-25 NOTE — Progress Notes (Addendum)
 Progress Note    [name] Young.  BMW:[id number] DOB: [birth year]  DOA: 11/20/2023 PCP: [name], MD      Brief Narrative:    Medical records reviewed and are as summarized below:  [name] Young. is a 83 y.o. male  with medical history significant for chronic kidney disease stage III, BPH, HTN, DM2, paroxysmal atrial fibrillation (Eliquis), chronic diastolic heart failure with recurrent pleural effusions. He is familiar to IR from multiple thoracenteses with the last one on 11/16/23. 800 ml of yellow fluid was removed and post-procedure CXR was negative for a pneumothorax. Shortly after leaving the hospital he developed pleuritic right-sided chest pain that persisted.   He presented to the hospital on 11/20/2023 for right and left heart catheterization. Fluoroscopy of the right chest was performed before access was obtained and this showed a large right pleural effusion with near complete collapse of the right lung. The catheterization was cancelled and additional imaging obtained shows a hemothorax. His labs showed a 3 gm drop in hemoglobin.   He was admitted to the ICU for right hemothorax complicated by acute blood loss anemia.  Chest tube was placed in the right chest wall by interventional radiologist.  He was transfused 1 unit of PRBCs on 11/20/2023.  He was transferred from the ICU to [facility] on 11/22/2023.     Assessment/Plan:   Principal Problem:   Hemothorax Active Problems:   Coronary artery disease   PAF (paroxysmal atrial fibrillation) (HCC)   Aortic valve stenosis   Chronic diastolic CHF (congestive heart failure) (HCC)   Hemothorax on right    Body mass index is 32.46 kg/m.  (Obesity)   Right hemothorax with complete collapse of right lung, recent right thoracentesis on 11/16/2023: S/p right chest tube placement with about 1.7 L of bloody output on 11/20/2023 by IR.   Analgesics as needed for pain.  He has been getting  intrapleural tPA. Continue IV Zosyn for suspected empyema.  No growth on pleural fluid culture thus far.  Chest x-ray on 11/25/2022 showed stable low volume chest.  No pneumothorax or increasing pleural fluid.   Acute blood loss anemia: Hemoglobin trending down, from 8.7-8.1.  Monitor H&H and transfuse as needed s/p transfusion 1 unit of PRBCs on 11/20/2023 for hemoglobin of 7.2.   AKI: Improved. Creatinine is stable. Creatinine was 1.71 on admission.    Acute hypoxic respiratory failure: Improved.  He is tolerating room air.   Paroxysmal atrial fibrillation, nonsustained VT, frequent PVCs on telemetry: Continue metoprolol.  Eliquis has been held.   Chronic diastolic CHF, CAD, moderate aortic valve stenosis compensated. Restart Lasix and metolazone. 2D echo showed EF estimated at 40 to 45%, grade 1 diastolic dysfunction, mild LVH, mild MR, mild AR, mild to moderate AAS. 2D echo in April 2024 showed EF estimated at 50 to 55%.   General Weakness: PT and OT recommended SNF at discharge.   Constipation: Laxatives as needed    Diet Order             Diet heart healthy/carb modified Room service appropriate? Yes; Fluid consistency: Thin  Diet effective now                            Consultants: Interventional radiologist Cardiologist Pulmonologist  Procedures: Right chest tube placement on 11/20/2023    Medications:    sodium chloride   Intravenous Once   acetaminophen  650 mg  Oral Q6H   Chlorhexidine Gluconate Cloth  6 each Topical Daily   furosemide  40 mg Oral Daily   insulin aspart  0-15 Units Subcutaneous TID WC   insulin aspart  0-5 Units Subcutaneous QHS   metolazone  2.5 mg Oral Daily   metoprolol tartrate  25 mg Oral BID   mirtazapine  15 mg Oral QHS   polyethylene glycol  17 g Oral Daily   rosuvastatin  10 mg Oral Daily   senna  2 tablet Oral QHS   Continuous Infusions:  piperacillin-tazobactam (ZOSYN)  IV 3.375 g (11/25/23 1008)      Anti-infectives (From admission, onward)    Start     Dose/Rate Route Frequency Ordered Stop   11/23/23 1000  piperacillin-tazobactam (ZOSYN) IVPB 3.375 g        3.375 g 12.5 mL/hr over 240 Minutes Intravenous Every 8 hours 11/23/23 0908                Family Communication/Anticipated D/C date and plan/Code Status   DVT prophylaxis: SCDs Start: 11/20/23 1353     Code Status: Full Code  Family Communication: Plan discussed with his wife at the bedside Disposition Plan: Plan to discharge to SNF   Status is: Inpatient Remains inpatient appropriate because: Right-sided hemithorax       Subjective:   No acute events overnight.  He has no complaints.  He still has some pain at the chest tube sites.  No other complaints.  No shortness of breath.  Objective:    Vitals:   11/24/23 2322 11/25/23 0351 11/25/23 0500 11/25/23 0854  BP: (!) 103/54 (!) 105/56  125/64  Pulse: 65 72  81  Resp: 20 (!) 24  (!) 22  Temp: 98.5 F (36.9 C) 98.5 F (36.9 C)  98.3 F (36.8 C)  TempSrc: Oral Oral  Oral  SpO2: 94% 95%  92%  Weight:   102.6 kg   Height:       No data found.   Intake/Output Summary (Last 24 hours) at 11/25/2023 1253 Last data filed at 11/25/2023 1200 Gross per 24 hour  Intake 40 ml  Output 721 ml  Net -681 ml   Filed Weights   11/23/23 0400 11/24/23 0224 11/25/23 0500  Weight: 99.5 kg 99.6 kg 102.6 kg    Exam:  GEN: NAD SKIN: Warm and dry EYES: No pallor or icterus ENT: MMM CV: RRR PULM: Chest tube in right lateral chest wall with some drainage.  No rales or wheezing heard ABD: soft, obese, NT, +BS CNS: AAO x 3, non focal EXT: No edema or tenderness     Data Reviewed:   I have personally reviewed following labs and imaging studies:  Labs: Labs show the following:   Basic Metabolic Panel: Recent Labs  Lab 11/21/23 0239 11/22/23 0330 11/22/23 1450 11/23/23 0407 11/24/23 0404 11/25/23 0443  NA 134* 135  --  135 133* 135   K 4.2 4.4 4.3 4.8 4.2 3.8  CL 94* 95*  --  94* 93* 93*  CO2 31 31  --  29 31 30   GLUCOSE 150* 145*  --  152* 141* 128*  BUN 67* 62*  --  62* 60* 61*  CREATININE 1.45* 1.27*  --  1.29* 1.35* 1.38*  CALCIUM 7.8* 7.7*  --  8.4* 7.6* 7.6*  MG 3.1* 3.0* 3.0* 3.0* 3.0* 3.0*  PHOS 4.3 4.2  --   --   --  4.5   GFR Estimated Creatinine Clearance: 50.4 mL/min (  A) (by C-G formula based on SCr of 1.38 mg/dL (H)). Liver Function Tests: Recent Labs  Lab 11/20/23 1220 11/21/23 0239 11/22/23 0330 11/25/23 0443  AST 37  --   --   --   ALT 38  --   --   --   ALKPHOS 68  --   --   --   BILITOT 0.6  --   --   --   PROT 7.0  --   --   --   ALBUMIN 2.4* 2.2* 2.1* 1.9*   No results for input(s): "LIPASE", "AMYLASE" in the last 168 hours. No results for input(s): "AMMONIA" in the last 168 hours. Coagulation profile Recent Labs  Lab 11/21/23 0902  INR 1.4*    CBC: Recent Labs  Lab 11/20/23 1220 11/20/23 1824 11/21/23 0239 11/21/23 1326 11/23/23 0006 11/23/23 0406 11/23/23 0407 11/24/23 0404 11/25/23 0443  WBC 13.5*   < > 11.8*   < > 16.6* 14.5* 14.7* 13.0* 11.8*  NEUTROABS 11.4*  --  8.6*  --   --  11.2*  --   --   --   HGB 7.2*   < > 8.1*   < > 8.9* 9.4* 9.4* 8.7* 8.1*  HCT 23.7*   < > 25.9*   < > 28.4* 30.8* 30.3* 28.4* 26.1*  MCV 86.8   < > 86.3   < > 84.0 87.3 84.6 86.3 85.6  PLT 363   < > 345   < > 452* 451* 438* 398 391   < > = values in this interval not displayed.   Cardiac Enzymes: No results for input(s): "CKTOTAL", "CKMB", "CKMBINDEX", "TROPONINI" in the last 168 hours. BNP (last 3 results) Recent Labs    12/21/22 1455 04/25/23 1516  PROBNP 357.0* 142.0*   CBG: Recent Labs  Lab 11/23/23 2143 11/24/23 0745 11/24/23 1116 11/24/23 2134 11/25/23 0855  GLUCAP 147* 137* 245* 145* 127*   D-Dimer: No results for input(s): "DDIMER" in the last 72 hours. Hgb A1c: No results for input(s): "HGBA1C" in the last 72 hours. Lipid Profile: No results for input(s):  "CHOL", "HDL", "LDLCALC", "TRIG", "CHOLHDL", "LDLDIRECT" in the last 72 hours. Thyroid function studies: No results for input(s): "TSH", "T4TOTAL", "T3FREE", "THYROIDAB" in the last 72 hours.  Invalid input(s): "FREET3" Anemia work up: No results for input(s): "VITAMINB12", "FOLATE", "FERRITIN", "TIBC", "IRON", "RETICCTPCT" in the last 72 hours. Sepsis Labs: Recent Labs  Lab 11/23/23 0406 11/23/23 0407 11/24/23 0404 11/25/23 0443  PROCALCITON  --   --   --  0.32  WBC 14.5* 14.7* 13.0* 11.8*    Microbiology Recent Results (from the past 240 hours)  Body fluid culture w Gram Stain     Status: None   Collection Time: 11/16/23 11:07 AM   Specimen: PATH Cytology Pleural fluid  Result Value Ref Range Status   Specimen Description   Final    PLEURAL Performed at [facility], [address] [place], [place] [address]    Special Requests   Final    NONE Performed at [facility], [address] [place], [place] [address]    Gram Stain NO WBC SEEN NO ORGANISMS SEEN   Final   Culture   Final    NO GROWTH 3 DAYS Performed at [facility], [address] [place], [place] [address]    Report Status 11/19/2023 FINAL  Final  Body fluid culture w Gram Stain     Status: None (Preliminary result)   Collection  Time: 11/23/23  9:10 AM   Specimen: Pleura; Body Fluid  Result Value Ref Range Status   Specimen Description   Final    PLEURAL Performed at [facility], [address] [place], [place] [address]    Special Requests   Final    NONE Performed at [facility], [address] [place], [place] [address]    Gram Stain   Final    FEW WBC PRESENT, PREDOMINANTLY PMN NO ORGANISMS SEEN    Culture   Final    NO GROWTH 2 DAYS Performed at [facility], [address] [place], [place] [address]    Report Status PENDING  Incomplete  MRSA Next Gen by PCR, Nasal     Status: None   Collection Time: 11/23/23  2:18 PM    Specimen: Nasal Mucosa; Nasal Swab  Result Value Ref Range Status   MRSA by PCR Next Gen NOT DETECTED NOT DETECTED Final    Comment: (NOTE) The GeneXpert MRSA Assay (FDA approved for NASAL specimens only), is one component of a comprehensive MRSA colonization surveillance program. It is not intended to diagnose MRSA infection nor to guide or monitor treatment for MRSA infections. Test performance is not FDA approved in patients less than 61 years old. Performed at [facility], [address] [place], [place] [address]   Expectorated Sputum Assessment w Gram Stain, Rflx to Resp Cult     Status: None   Collection Time: 11/23/23  6:17 PM   Specimen: Sputum  Result Value Ref Range Status   Specimen Description SPU EXPECTORATED SPUTUM  Final   Special Requests NONE  Final   Sputum evaluation   Final    THIS SPECIMEN IS ACCEPTABLE FOR SPUTUM CULTURE Performed at [facility], [address] [place], [place] [address]    Report Status 11/23/2023 FINAL  Final  Culture, Respiratory w Gram Stain     Status: None (Preliminary result)   Collection Time: 11/23/23  6:17 PM   Specimen: Sputum  Result Value Ref Range Status   Specimen Description   Final    SPU EXPECTORATED SPUTUM Performed at [facility], [address], [place], [place] [address]    Special Requests   Final    NONE Reflexed from [id number] Performed at [facility], [address] [place], [place] [address]    Gram Stain   Final    WBC PRESENT, PREDOMINANTLY PMN RARE GRAM POSITIVE COCCI    Culture   Final    TOO YOUNG TO READ Performed at [facility], [address] [place], [place] [address]    Report Status PENDING  Incomplete    Procedures and diagnostic studies:  DG Chest Port 1 View Result Date: 11/25/2023 CLINICAL DATA:  Pleural effusion EXAM: PORTABLE CHEST 1 VIEW COMPARISON:  Yesterday FINDINGS: Chest tube at the right base in stable position. No increasing  pleural fluid or visible pneumothorax. Lung volumes remain low with hazy density attributed atelectasis. No edema. Cardiomegaly with coronary stenting and CABG. Artifact from EKG leads. IMPRESSION: Stable low volume chest. No pneumothorax or increasing pleural fluid. Electronically Signed   By: Tiburcio Pea M.D.   On: 11/25/2023 08:30   DG Chest Port 1 View Result Date: 11/24/2023 CLINICAL DATA:  Pleural effusion. EXAM: PORTABLE CHEST 1 VIEW COMPARISON:  11/23/2023. FINDINGS: There is trace right pleural effusion, which appears decreased in size since the prior study. Right pleural drainage catheter and trace right pleural  effusion are again seen. There is linear area of atelectasis/scarring at the left lung base, laterally. Bilateral lung fields are otherwise clear. No acute consolidation or lung collapse. No pneumothorax on either side. Stable cardio-mediastinal silhouette. There are surgical staples along the heart border and sternotomy wires, status post CABG (coronary artery bypass graft). No acute osseous abnormalities. The soft tissues are within normal limits. IMPRESSION: Trace right pleural effusion, decreased since the prior study. Electronically Signed   By: Jules Schick M.D.   On: 11/24/2023 12:06   CT CHEST WO CONTRAST Result Date: 11/23/2023 CLINICAL DATA:  Pleural effusion chest tube EXAM: CT CHEST WITHOUT CONTRAST TECHNIQUE: Multidetector CT imaging of the chest was performed following the standard protocol without IV contrast. RADIATION DOSE REDUCTION: This exam was performed according to the departmental dose-optimization program which includes automated exposure control, adjustment of the mA and/or kV according to patient size and/or use of iterative reconstruction technique. COMPARISON:  Chest x-ray 11/23/2023, CT chest 11/20/2023, 05/16/2023 FINDINGS: Cardiovascular: Limited evaluation without intravenous contrast. Moderate aortic atherosclerosis. No aneurysm. Coronary vascular  calcification. Borderline to mild cardiomegaly. No pericardial effusion. Post CABG changes. Mediastinum/Nodes: Patent trachea. No thyroid mass. No suspicious lymph nodes. Esophagus within normal limits. Lungs/Pleura: Mild subpleural reticulation. Bandlike densities in the right upper and lower lobes which may be due to atelectasis or pulmonary scarring. Interim placement of right lateral percutaneous drainage catheter with pigtail at the right posterior lung base. Significant interval decrease in right-sided pleural effusion compared to the prior chest CT. Moderate air with fluid level in the lateral pleural space consistent with pneumothorax. Decreased right lung consolidations compared to prior consistent with improved atelectasis. Hyperdense masses along the pleural surface. Lobulated hyperdensity along the right middle lobe pleural surface measures 4.2 by 2 cm. Dominant hyperdense pleural density within the posterior right lower lobe measures about 12.3 x 3.9 cm, overall decreased in size compared with the prior chest CT. Upper Abdomen: Small volume perihepatic free fluid. Suspicion of small organized fluid collection along the right posterior liver margin, for example series 2, image 156, measuring about 5.5 x 3 cm Musculoskeletal: Sternotomy. Multilevel degenerative osteophytes. No acute osseous abnormality IMPRESSION: 1. Interim placement of right lateral percutaneous drainage catheter with pigtail at the right posterior lung base. Significant interval decrease in size of complex right-sided pleural effusion compared to the prior chest CT. Moderate air with fluid level in the lateral pleural space consistent with hydro/hematohydropneumothorax 2. Decreased right lung consolidations compared to prior consistent with improved atelectasis and overall better aeration of the right lung parenchyma. 3. Hyperdense masses along the right pleural surface as described above, overall decreased in size compared with the  prior chest CT. Findings may be due to pleural hemorrhage/hematoma but pleural mass not excluded and continued CT follow-up is recommended to ensure resolution. 4. Small volume perihepatic free fluid. Suspicion of small organized fluid collection along the right posterior liver margin measuring about 5.5 x 3 cm. This is indeterminate for infected fluid collection or organizing hematoma, further assessment limited without contrast 5. Aortic atherosclerosis. Aortic Atherosclerosis (ICD10-I70.0). Electronically Signed   By: Jasmine Pang M.D.   On: 11/23/2023 16:54               LOS: 5 days   Jodiann Ognibene  [facility]   Pager on [email or web address]. If 7PM-7AM, please contact night-coverage at [email or web address]     11/25/2023, 12:53 PM

## 2023-11-25 NOTE — TOC Initial Note (Signed)
 Transition of Care (TOC) - Initial/Assessment Note    Patient Details  Name: [name] Young. MRN: [id number] Date of Birth: 1941/04/08  Transition of Care [facility]) CM/SW Contact:    Liliana Cline, LCSW Phone Number: 11/25/2023, 2:47 PM  Clinical Narrative:                 Patient is from home with wife who provides transportation. PCP is Dr. [name]. [facility]acy [facility] Patient has a cane and walker at home. Patient has had [facility] in the past. Patient has been to [place] for STR in the past. Patient and spouse are aware of SNF rec from PT, and request to discuss this and TOC to follow up tomorrow on their decision (SNF versus HH).  Expected Discharge Plan: Home w Home Health Services Barriers to Discharge: Continued Medical Work up   Patient Goals and CMS Choice   CMS Medicare.gov Compare Post Acute Care list provided to:: Patient Choice offered to / list presented to : Spouse, Patient      Expected Discharge Plan and Services       Living arrangements for the past 2 months: Single Family Home                                      Prior Living Arrangements/Services Living arrangements for the past 2 months: Single Family Home Lives with:: Spouse Patient language and need for interpreter reviewed:: Yes Do you feel safe going back to the place where you live?: Yes      Need for Family Participation in Patient Care: Yes (Comment) Care giver support system in place?: Yes (comment) Current home services: DME Criminal Activity/Legal Involvement Pertinent to Current Situation/Hospitalization: No - Comment as needed  Activities of Daily Living   ADL Screening (condition at time of admission) Independently performs ADLs?: Yes (appropriate for developmental age) Is the patient deaf or have difficulty hearing?: Yes Does the patient have difficulty seeing, even when wearing glasses/contacts?: No Does the patient have difficulty concentrating,  remembering, or making decisions?: No  Permission Sought/Granted Permission sought to share information with : Facility Industrial/product designer granted to share information with : Yes, Verbal Permission Granted              Emotional Assessment         Alcohol / Substance Use: Not Applicable Psych Involvement: No (comment)  Admission diagnosis:  Coronary artery disease involving native coronary artery, unspecified whether angina present, unspecified whether native or transplanted heart [I25.10] Hemothorax [J94.2] Hemothorax on right [J94.2] Patient Active Problem List   Diagnosis Date Noted   Hemothorax 11/20/2023   Hemothorax on right 11/20/2023   Paronychia of great toe of left foot 05/25/2023   Bradycardia 04/25/2023   General weakness 04/25/2023   Chronic HFrEF (heart failure with reduced ejection fraction) (HCC) 03/06/2023   Frequent PVCs 03/06/2023   Insomnia 03/05/2023   Decubitus ulcer of right buttock, stage 1 01/04/2023   Aortic valve stenosis 01/02/2023   Chronic diastolic CHF (congestive heart failure) (HCC) 01/02/2023   History of endocarditis 12/28/2022   Hepatic abscess 12/28/2022   Protein-calorie malnutrition, severe (HCC) 12/28/2022   Abnormal TSH 12/27/2022   Recurrent pleural effusion on right 12/21/2022   Pre-op evaluation 05/03/2022   CCC (chronic calculous cholecystitis) 04/25/2022   History of acute cholangitis 04/25/2022   Infective endocarditis of mitral valve 11/12/2021  Palliative care by specialist    Bacteremia due to Streptococcus 10/24/2021   Vitamin B12 deficiency 05/24/2020   Vitamin D deficiency 05/24/2020   Early dry stage nonexudative age-related macular degeneration of both eyes 12/01/2019   Pain due to onychomycosis of toenails of both feet 07/28/2019   Diabetic neuropathy (HCC) 07/28/2019   Hearing loss, right 05/22/2019   PAF (paroxysmal atrial fibrillation) (HCC) 02/08/2018   Anemia    S/P CABG x 3 01/18/2018    Coronary artery disease 12/29/2017   Chronic inactive rheumatic heart disease 11/24/2017   SOB (shortness of breath) 11/05/2017   DNR (do not resuscitate) 06/14/2017   Peripheral neuropathy 06/14/2017   Health maintenance examination 12/12/2016   Obesity, Class I, BMI 30-34.9 12/12/2016   Advanced care planning/counseling discussion 10/30/2014   Benign prostatic hyperplasia 10/30/2014   CKD stage 3 due to type 2 diabetes mellitus (HCC) 07/08/2013   Medicare annual wellness visit, subsequent 10/11/2011   Type 2 diabetes mellitus with other specified complication (HCC)    Hyperlipidemia associated with type 2 diabetes mellitus (HCC)    History of kidney stones    GERD (gastroesophageal reflux disease)    PCP:  [name], MD [phone] [place], [place] - [address] [place] [address] Phone: [phone] Fax: [phone]     Social Drivers of Health (SDOH) Social History: SDOH Screenings   Food Insecurity: No Food Insecurity (11/20/2023)  Housing: Low Risk  (11/20/2023)  Transportation Needs: No Transportation Needs (11/20/2023)  Utilities: Not At Risk (11/20/2023)  Depression (PHQ2-9): Low Risk  (10/08/2023)  Financial Resource Strain: Low Risk  (10/08/2023)  Physical Activity: Unknown (10/08/2023)  Social Connections: Moderately Isolated (11/20/2023)  Stress: Patient Declined (10/08/2023)  Tobacco Use: Medium Risk (11/20/2023)   SDOH Interventions:     Readmission Risk Interventions     No data to display

## 2023-11-25 NOTE — Plan of Care (Signed)
  Problem: Education: Goal: Understanding of CV disease, CV risk reduction, and recovery process will improve Outcome: Progressing Goal: Individualized Educational Video(s) Outcome: Progressing   Problem: Activity: Goal: Ability to return to baseline activity level will improve Outcome: Progressing   Problem: Cardiovascular: Goal: Ability to achieve and maintain adequate cardiovascular perfusion will improve Outcome: Progressing Goal: Vascular access site(s) Level 0-1 will be maintained Outcome: Progressing   Problem: Health Behavior/Discharge Planning: Goal: Ability to safely manage health-related needs after discharge will improve Outcome: Progressing   Problem: Education: Goal: Knowledge of General Education information will improve Description: Including pain rating scale, medication(s)/side effects and non-pharmacologic comfort measures Outcome: Progressing   Problem: Health Behavior/Discharge Planning: Goal: Ability to manage health-related needs will improve Outcome: Progressing   Problem: Clinical Measurements: Goal: Ability to maintain clinical measurements within normal limits will improve Outcome: Progressing Goal: Will remain free from infection Outcome: Progressing Goal: Diagnostic test results will improve Outcome: Progressing Goal: Respiratory complications will improve Outcome: Progressing Goal: Cardiovascular complication will be avoided Outcome: Progressing   Problem: Activity: Goal: Risk for activity intolerance will decrease Outcome: Progressing   Problem: Nutrition: Goal: Adequate nutrition will be maintained Outcome: Progressing   Problem: Coping: Goal: Level of anxiety will decrease Outcome: Progressing   Problem: Elimination: Goal: Will not experience complications related to bowel motility Outcome: Progressing Goal: Will not experience complications related to urinary retention Outcome: Progressing   Problem: Pain Managment: Goal:  General experience of comfort will improve and/or be controlled Outcome: Progressing   Problem: Safety: Goal: Ability to remain free from injury will improve Outcome: Progressing   Problem: Skin Integrity: Goal: Risk for impaired skin integrity will decrease Outcome: Progressing   Problem: Education: Goal: Ability to describe self-care measures that may prevent or decrease complications (Diabetes Survival Skills Education) will improve Outcome: Progressing Goal: Individualized Educational Video(s) Outcome: Progressing   Problem: Coping: Goal: Ability to adjust to condition or change in health will improve Outcome: Progressing   Problem: Fluid Volume: Goal: Ability to maintain a balanced intake and output will improve Outcome: Progressing   Problem: Health Behavior/Discharge Planning: Goal: Ability to identify and utilize available resources and services will improve Outcome: Progressing Goal: Ability to manage health-related needs will improve Outcome: Progressing   Problem: Metabolic: Goal: Ability to maintain appropriate glucose levels will improve Outcome: Progressing   Problem: Nutritional: Goal: Maintenance of adequate nutrition will improve Outcome: Progressing Goal: Progress toward achieving an optimal weight will improve Outcome: Progressing   Problem: Skin Integrity: Goal: Risk for impaired skin integrity will decrease Outcome: Progressing   Problem: Tissue Perfusion: Goal: Adequacy of tissue perfusion will improve Outcome: Progressing

## 2023-11-25 NOTE — Progress Notes (Signed)
 PHARMACY CONSULT NOTE - FOLLOW UP  Pharmacy Consult for Electrolyte Monitoring and Replacement   Recent Labs: Potassium (mmol/L)  Date Value  11/25/2023 3.8  02/01/2010 4.5   Magnesium (mg/dL)  Date Value  16/06/9603 3.0 (H)   Calcium (mg/dL)  Date Value  54/05/8118 7.6 (L)  02/01/2010 9.2   Albumin (g/dL)  Date Value  14/78/2956 1.9 (L)  09/17/2023 3.6 (L)   Phosphorus (mg/dL)  Date Value  21/30/8657 4.5   Sodium (mmol/L)  Date Value  11/25/2023 135  10/03/2023 141   Corrected Ca: 9.28  Assessment: [name] Young is a 83 yo male who presented for surgery with a history significant for frustration due to recurrent shortness of breath. They have a history of fatigue and exertional dyspnea with minimal activity that improved following thoracentesis in January/Feburary. At a previous appointment, they asked for another thoracentesis to provide relief and asked about a more permanent solution like Pleurx catheter to help relieve their history of SOB. Pharmacy was consulted to manage this patient's electrolytes while they're in the ICU.   Goal of Therapy:  Electrolytes WNL  Plan:  No replacement indicated at this time Magnesium remains elevated throughout this admission Continue to follow electrolytes with AM labs  Bettey Costa, PharmD Clinical Pharmacist 11/25/2023 7:39 AM

## 2023-11-26 DIAGNOSIS — J942 Hemothorax: Secondary | ICD-10-CM | POA: Diagnosis not present

## 2023-11-26 LAB — CBC
HCT: 27.1 % — ABNORMAL LOW (ref 39.0–52.0)
Hemoglobin: 8.3 g/dL — ABNORMAL LOW (ref 13.0–17.0)
MCH: 26.5 pg (ref 26.0–34.0)
MCHC: 30.6 g/dL (ref 30.0–36.0)
MCV: 86.6 fL (ref 80.0–100.0)
Platelets: 374 10*3/uL (ref 150–400)
RBC: 3.13 MIL/uL — ABNORMAL LOW (ref 4.22–5.81)
RDW: 18.6 % — ABNORMAL HIGH (ref 11.5–15.5)
WBC: 11.6 10*3/uL — ABNORMAL HIGH (ref 4.0–10.5)
nRBC: 0 % (ref 0.0–0.2)

## 2023-11-26 LAB — BASIC METABOLIC PANEL
Anion gap: 13 (ref 5–15)
BUN: 50 mg/dL — ABNORMAL HIGH (ref 8–23)
CO2: 30 mmol/L (ref 22–32)
Calcium: 7.8 mg/dL — ABNORMAL LOW (ref 8.9–10.3)
Chloride: 94 mmol/L — ABNORMAL LOW (ref 98–111)
Creatinine, Ser: 1.48 mg/dL — ABNORMAL HIGH (ref 0.61–1.24)
GFR, Estimated: 47 mL/min — ABNORMAL LOW (ref >60)
Glucose, Bld: 138 mg/dL — ABNORMAL HIGH (ref 70–99)
Potassium: 3.4 mmol/L — ABNORMAL LOW (ref 3.5–5.1)
Sodium: 137 mmol/L (ref 135–145)

## 2023-11-26 LAB — CULTURE, RESPIRATORY W GRAM STAIN

## 2023-11-26 LAB — GLUCOSE, CAPILLARY
Glucose-Capillary: 143 mg/dL — ABNORMAL HIGH (ref 70–99)
Glucose-Capillary: 233 mg/dL — ABNORMAL HIGH (ref 70–99)
Glucose-Capillary: 170 mg/dL — ABNORMAL HIGH (ref 70–99)
Glucose-Capillary: 138 mg/dL — ABNORMAL HIGH (ref 70–99)

## 2023-11-26 LAB — MAGNESIUM: Magnesium: 2.7 mg/dL — ABNORMAL HIGH (ref 1.7–2.4)

## 2023-11-26 LAB — ANA W/REFLEX IF POSITIVE: Anti Nuclear Antibody (ANA): NEGATIVE

## 2023-11-26 LAB — PHOSPHORUS: Phosphorus: 3.9 mg/dL (ref 2.5–4.6)

## 2023-11-26 NOTE — Care Management Important Message (Signed)
 Important Message  Patient Details  Name: [name] Young. MRN: [id number] Date of Birth: Apr 26, 1941   Important Message Given:  Yes - Medicare IM     Cristela Blue, CMA 11/26/2023, 12:06 PM

## 2023-11-26 NOTE — Progress Notes (Signed)
 Progress Note    [name] Young.  ZOX:[id number] DOB: [birth year]  DOA: 11/20/2023 PCP: [name], MD      Brief Narrative:    Medical records reviewed and are as summarized below:  [name] Young. is a 83 y.o. male  with medical history significant for chronic kidney disease stage III, BPH, HTN, DM2, paroxysmal atrial fibrillation (Eliquis), chronic diastolic heart failure with recurrent pleural effusions. He is familiar to IR from multiple thoracenteses with the last one on 11/16/23. 800 ml of yellow fluid was removed and post-procedure CXR was negative for a pneumothorax. Shortly after leaving the hospital he developed pleuritic right-sided chest pain that persisted.   He presented to the hospital on 11/20/2023 for right and left heart catheterization. Fluoroscopy of the right chest was performed before access was obtained and this showed a large right pleural effusion with near complete collapse of the right lung. The catheterization was cancelled and additional imaging obtained shows a hemothorax. His labs showed a 3 gm drop in hemoglobin.   He was admitted to the ICU for right hemothorax complicated by acute blood loss anemia.  Chest tube was placed in the right chest wall by interventional radiologist.  He was transfused 1 unit of PRBCs on 11/20/2023.  He was transferred from the ICU to [facility] service on 11/22/2023.     Assessment/Plan:   Principal Problem:   Hemothorax Active Problems:   Coronary artery disease   PAF (paroxysmal atrial fibrillation) (HCC)   Aortic valve stenosis   Chronic diastolic CHF (congestive heart failure) (HCC)   Hemothorax on right    Body mass index is 32.27 kg/m.  (Obesity)   Right hemothorax with complete collapse of right lung, recent right thoracentesis on 11/16/2023: S/p right chest tube placement with about 1.7 L of bloody output on 11/20/2023 by IR.   Analgesics as needed for pain.  He has been getting  intrapleural tPA. Continue IV Zosyn for suspected empyema.  No growth on pleural fluid culture thus far.  Chest x-ray on 11/25/2022 showed stable low volume chest.  No pneumothorax or increasing pleural fluid.   Acute blood loss anemia: H&H is stable.  Hemoglobin 8.3.  Monitor H&H and transfuse as needed s/p transfusion 1 unit of PRBCs on 11/20/2023 for hemoglobin of 7.2.   AKI: Improved. Creatinine slowly trending upward, 1.38-1.48. Creatinine was 1.71 on admission.    Acute hypoxic respiratory failure: Improved.  He is tolerating room air.   Paroxysmal atrial fibrillation, nonsustained VT, frequent PVCs on telemetry: Continue metoprolol.  Eliquis has been held.   Chronic diastolic CHF, CAD, moderate aortic valve stenosis compensated. Continue Lasix.  Hold metolazone. 2D echo showed EF estimated at 40 to 45%, grade 1 diastolic dysfunction, mild LVH, mild MR, mild AR, mild to moderate AAS. 2D echo in April 2024 showed EF estimated at 50 to 55%.   General Weakness: PT and OT recommended SNF at discharge.   Constipation: Laxatives as needed    Diet Order             Diet heart healthy/carb modified Room service appropriate? Yes; Fluid consistency: Thin  Diet effective now                            Consultants: Interventional radiologist Cardiologist Pulmonologist  Procedures: Right chest tube placement on 11/20/2023    Medications:    sodium chloride   Intravenous Once  acetaminophen  650 mg Oral Q6H   Chlorhexidine Gluconate Cloth  6 each Topical Daily   furosemide  40 mg Oral Daily   insulin aspart  0-15 Units Subcutaneous TID WC   insulin aspart  0-5 Units Subcutaneous QHS   metolazone  2.5 mg Oral Daily   metoprolol tartrate  25 mg Oral BID   mirtazapine  15 mg Oral QHS   polyethylene glycol  17 g Oral Daily   rosuvastatin  10 mg Oral Daily   senna  2 tablet Oral QHS   Continuous Infusions:  piperacillin-tazobactam (ZOSYN)  IV 3.375 g  (11/26/23 0928)     Anti-infectives (From admission, onward)    Start     Dose/Rate Route Frequency Ordered Stop   11/23/23 1000  piperacillin-tazobactam (ZOSYN) IVPB 3.375 g        3.375 g 12.5 mL/hr over 240 Minutes Intravenous Every 8 hours 11/23/23 0908                Family Communication/Anticipated D/C date and plan/Code Status   DVT prophylaxis: SCDs Start: 11/20/23 1353     Code Status: Full Code  Family Communication: Plan discussed with his wife at the bedside Disposition Plan: Plan to discharge to SNF   Status is: Inpatient Remains inpatient appropriate because: Right-sided hemithorax       Subjective:   Interval events noted.  He has no complaints.  His wife was at the bedside.  His wife said she was hoping that patient can go home with home health therapy rather than go to SNF.  Objective:    Vitals:   11/26/23 0455 11/26/23 0500 11/26/23 0831 11/26/23 1305  BP: (!) 101/50  (!) 119/98 104/66  Pulse: 93  79 73  Resp: 20     Temp: 98 F (36.7 C)  98.1 F (36.7 C) 98.9 F (37.2 C)  TempSrc: Oral   Oral  SpO2: 93%  95% 97%  Weight:  102 kg    Height:       No data found.   Intake/Output Summary (Last 24 hours) at 11/26/2023 1518 Last data filed at 11/26/2023 1100 Gross per 24 hour  Intake 250 ml  Output 670 ml  Net -420 ml   Filed Weights   11/24/23 0224 11/25/23 0500 11/26/23 0500  Weight: 99.6 kg 102.6 kg 102 kg    Exam:   GEN: NAD SKIN: Warm and dry EYES: No pallor or icterus  ENT: MMM CV: RRR PULM: Chest tube in right lateral chest wall.  No rales or wheezing heard ABD: soft, obese, NT, +BS CNS: AAO x 3, non focal EXT: No edema or tenderness        Data Reviewed:   I have personally reviewed following labs and imaging studies:  Labs: Labs show the following:   Basic Metabolic Panel: Recent Labs  Lab 11/21/23 0239 11/22/23 0330 11/22/23 1450 11/23/23 0407 11/24/23 0404 11/25/23 0443 11/26/23 0657   NA 134* 135  --  135 133* 135 137  K 4.2 4.4 4.3 4.8 4.2 3.8 3.4*  CL 94* 95*  --  94* 93* 93* 94*  CO2 31 31  --  29 31 30 30   GLUCOSE 150* 145*  --  152* 141* 128* 138*  BUN 67* 62*  --  62* 60* 61* 50*  CREATININE 1.45* 1.27*  --  1.29* 1.35* 1.38* 1.48*  CALCIUM 7.8* 7.7*  --  8.4* 7.6* 7.6* 7.8*  MG 3.1* 3.0* 3.0* 3.0* 3.0* 3.0* 2.7*  PHOS 4.3 4.2  --   --   --  4.5 3.9   GFR Estimated Creatinine Clearance: 46.8 mL/min (A) (by C-G formula based on SCr of 1.48 mg/dL (H)). Liver Function Tests: Recent Labs  Lab 11/20/23 1220 11/21/23 0239 11/22/23 0330 11/25/23 0443  AST 37  --   --   --   ALT 38  --   --   --   ALKPHOS 68  --   --   --   BILITOT 0.6  --   --   --   PROT 7.0  --   --   --   ALBUMIN 2.4* 2.2* 2.1* 1.9*   No results for input(s): "LIPASE", "AMYLASE" in the last 168 hours. No results for input(s): "AMMONIA" in the last 168 hours. Coagulation profile Recent Labs  Lab 11/21/23 0902  INR 1.4*    CBC: Recent Labs  Lab 11/20/23 1220 11/20/23 1824 11/21/23 0239 11/21/23 1326 11/23/23 0406 11/23/23 0407 11/24/23 0404 11/25/23 0443 11/26/23 0657  WBC 13.5*   < > 11.8*   < > 14.5* 14.7* 13.0* 11.8* 11.6*  NEUTROABS 11.4*  --  8.6*  --  11.2*  --   --   --   --   HGB 7.2*   < > 8.1*   < > 9.4* 9.4* 8.7* 8.1* 8.3*  HCT 23.7*   < > 25.9*   < > 30.8* 30.3* 28.4* 26.1* 27.1*  MCV 86.8   < > 86.3   < > 87.3 84.6 86.3 85.6 86.6  PLT 363   < > 345   < > 451* 438* 398 391 374   < > = values in this interval not displayed.   Cardiac Enzymes: No results for input(s): "CKTOTAL", "CKMB", "CKMBINDEX", "TROPONINI" in the last 168 hours. BNP (last 3 results) Recent Labs    12/21/22 1455 04/25/23 1516  PROBNP 357.0* 142.0*   CBG: Recent Labs  Lab 11/25/23 1321 11/25/23 1638 11/25/23 2153 11/26/23 0828 11/26/23 1302  GLUCAP 211* 216* 140* 143* 233*   D-Dimer: No results for input(s): "DDIMER" in the last 72 hours. Hgb A1c: No results for input(s):  "HGBA1C" in the last 72 hours. Lipid Profile: No results for input(s): "CHOL", "HDL", "LDLCALC", "TRIG", "CHOLHDL", "LDLDIRECT" in the last 72 hours. Thyroid function studies: No results for input(s): "TSH", "T4TOTAL", "T3FREE", "THYROIDAB" in the last 72 hours.  Invalid input(s): "FREET3" Anemia work up: No results for input(s): "VITAMINB12", "FOLATE", "FERRITIN", "TIBC", "IRON", "RETICCTPCT" in the last 72 hours. Sepsis Labs: Recent Labs  Lab 11/23/23 0407 11/24/23 0404 11/25/23 0443 11/26/23 0657  PROCALCITON  --   --  0.32  --   WBC 14.7* 13.0* 11.8* 11.6*    Microbiology Recent Results (from the past 240 hours)  Body fluid culture w Gram Stain     Status: None (Preliminary result)   Collection Time: 11/23/23  9:10 AM   Specimen: Pleura; Body Fluid  Result Value Ref Range Status   Specimen Description   Final    PLEURAL Performed at [facility], [address] [place], [place] [address]    Special Requests   Final    NONE Performed at [facility], [address] [place], [place] [address]    Gram Stain   Final    FEW WBC PRESENT, PREDOMINANTLY PMN NO ORGANISMS SEEN    Culture   Final    NO GROWTH 3 DAYS Performed at [facility], [address]  [place], [place] [address]    Report Status PENDING  Incomplete  MRSA Next Gen by PCR, Nasal     Status: None   Collection Time: 11/23/23  2:18 PM   Specimen: Nasal Mucosa; Nasal Swab  Result Value Ref Range Status   MRSA by PCR Next Gen NOT DETECTED NOT DETECTED Final    Comment: (NOTE) The GeneXpert MRSA Assay (FDA approved for NASAL specimens only), is one component of a comprehensive MRSA colonization surveillance program. It is not intended to diagnose MRSA infection nor to guide or monitor treatment for MRSA infections. Test performance is not FDA approved in patients less than 5 years old. Performed at [facility], [address] [place], [place] [address]    Expectorated Sputum Assessment w Gram Stain, Rflx to Resp Cult     Status: None   Collection Time: 11/23/23  6:17 PM   Specimen: Sputum  Result Value Ref Range Status   Specimen Description SPU EXPECTORATED SPUTUM  Final   Special Requests NONE  Final   Sputum evaluation   Final    THIS SPECIMEN IS ACCEPTABLE FOR SPUTUM CULTURE Performed at [facility], [address], [place], [place] [address]    Report Status 11/23/2023 FINAL  Final  Culture, Respiratory w Gram Stain     Status: None   Collection Time: 11/23/23  6:17 PM   Specimen: Sputum  Result Value Ref Range Status   Specimen Description   Final    SPU EXPECTORATED SPUTUM Performed at [facility], [address], [place], [place] [address]    Special Requests   Final    NONE Reflexed from [id number] Performed at [facility], [address] [place], [place] [address]    Gram Stain   Final    WBC PRESENT, PREDOMINANTLY PMN RARE GRAM POSITIVE COCCI    Culture   Final    RARE Normal respiratory flora-no Staph aureus or Pseudomonas seen Performed at [facility], [address] [place], [place] [address]    Report Status 11/26/2023 FINAL  Final    Procedures and diagnostic studies:  DG Chest Port 1 View Result Date: 11/25/2023 CLINICAL DATA:  Pleural effusion EXAM: PORTABLE CHEST 1 VIEW COMPARISON:  Yesterday FINDINGS: Chest tube at the right base in stable position. No increasing pleural fluid or visible pneumothorax. Lung volumes remain low with hazy density attributed atelectasis. No edema. Cardiomegaly with coronary stenting and CABG. Artifact from EKG leads. IMPRESSION: Stable low volume chest. No pneumothorax or increasing pleural fluid. Electronically Signed   By: Tiburcio Pea M.D.   On: 11/25/2023 08:30               LOS: 6 days   Elisabetta Mishra  [facility]   Pager on [email or web address]. If 7PM-7AM, please contact night-coverage at  [email or web address]     11/26/2023, 3:18 PM

## 2023-11-26 NOTE — Plan of Care (Signed)
  Problem: Education: Goal: Understanding of CV disease, CV risk reduction, and recovery process will improve Outcome: Progressing Goal: Individualized Educational Video(s) Outcome: Progressing   Problem: Activity: Goal: Ability to return to baseline activity level will improve Outcome: Progressing   Problem: Cardiovascular: Goal: Ability to achieve and maintain adequate cardiovascular perfusion will improve Outcome: Progressing Goal: Vascular access site(s) Level 0-1 will be maintained Outcome: Progressing   Problem: Health Behavior/Discharge Planning: Goal: Ability to safely manage health-related needs after discharge will improve Outcome: Progressing   Problem: Education: Goal: Knowledge of General Education information will improve Description: Including pain rating scale, medication(s)/side effects and non-pharmacologic comfort measures Outcome: Progressing   Problem: Health Behavior/Discharge Planning: Goal: Ability to manage health-related needs will improve Outcome: Progressing   Problem: Clinical Measurements: Goal: Ability to maintain clinical measurements within normal limits will improve Outcome: Progressing Goal: Will remain free from infection Outcome: Progressing Goal: Diagnostic test results will improve Outcome: Progressing Goal: Respiratory complications will improve Outcome: Progressing Goal: Cardiovascular complication will be avoided Outcome: Progressing   Problem: Activity: Goal: Risk for activity intolerance will decrease Outcome: Progressing   Problem: Nutrition: Goal: Adequate nutrition will be maintained Outcome: Progressing   Problem: Coping: Goal: Level of anxiety will decrease Outcome: Progressing   Problem: Elimination: Goal: Will not experience complications related to bowel motility Outcome: Progressing Goal: Will not experience complications related to urinary retention Outcome: Progressing   Problem: Pain Managment: Goal:  General experience of comfort will improve and/or be controlled Outcome: Progressing   Problem: Safety: Goal: Ability to remain free from injury will improve Outcome: Progressing   Problem: Skin Integrity: Goal: Risk for impaired skin integrity will decrease Outcome: Progressing   Problem: Education: Goal: Ability to describe self-care measures that may prevent or decrease complications (Diabetes Survival Skills Education) will improve Outcome: Progressing Goal: Individualized Educational Video(s) Outcome: Progressing   Problem: Coping: Goal: Ability to adjust to condition or change in health will improve Outcome: Progressing   Problem: Fluid Volume: Goal: Ability to maintain a balanced intake and output will improve Outcome: Progressing   Problem: Health Behavior/Discharge Planning: Goal: Ability to identify and utilize available resources and services will improve Outcome: Progressing Goal: Ability to manage health-related needs will improve Outcome: Progressing   Problem: Metabolic: Goal: Ability to maintain appropriate glucose levels will improve Outcome: Progressing   Problem: Nutritional: Goal: Maintenance of adequate nutrition will improve Outcome: Progressing Goal: Progress toward achieving an optimal weight will improve Outcome: Progressing   Problem: Skin Integrity: Goal: Risk for impaired skin integrity will decrease Outcome: Progressing   Problem: Tissue Perfusion: Goal: Adequacy of tissue perfusion will improve Outcome: Progressing

## 2023-11-26 NOTE — Progress Notes (Signed)
 PHARMACY CONSULT NOTE - FOLLOW UP  Pharmacy Consult for Electrolyte Monitoring and Replacement   Recent Labs: Potassium (mmol/L)  Date Value  11/25/2023 3.8  02/01/2010 4.5   Magnesium (mg/dL)  Date Value  16/06/9603 3.0 (H)   Calcium (mg/dL)  Date Value  54/05/8118 7.6 (L)  02/01/2010 9.2   Albumin (g/dL)  Date Value  14/78/2956 1.9 (L)  09/17/2023 3.6 (L)   Phosphorus (mg/dL)  Date Value  21/30/8657 4.5   Sodium (mmol/L)  Date Value  11/25/2023 135  10/03/2023 141   Corrected Ca: 9.28  Assessment: [name] Young is a 83 yo male who presented for surgery with a history significant for frustration due to recurrent shortness of breath. They have a history of fatigue and exertional dyspnea with minimal activity that improved following thoracentesis in January/Feburary. At a previous appointment, they asked for another thoracentesis to provide relief and asked about a more permanent solution like Pleurx catheter to help relieve their history of SOB. Pharmacy was consulted to manage this patient's electrolytes while they're in the ICU.   Goal of Therapy:  Electrolytes WNL  Plan:  No replacement indicated at this time Patient now on 2A, will dc CCM electrolytes monitoring consult and follow up as needed.  [name] Young PharmD, BCPS 11/26/2023 9:46 AM

## 2023-11-27 ENCOUNTER — Encounter: Payer: Self-pay | Admitting: Pulmonary Disease

## 2023-11-27 DIAGNOSIS — J942 Hemothorax: Secondary | ICD-10-CM | POA: Diagnosis not present

## 2023-11-27 LAB — GLUCOSE, CAPILLARY
Glucose-Capillary: 191 mg/dL — ABNORMAL HIGH (ref 70–99)
Glucose-Capillary: 218 mg/dL — ABNORMAL HIGH (ref 70–99)
Glucose-Capillary: 145 mg/dL — ABNORMAL HIGH (ref 70–99)
Glucose-Capillary: 158 mg/dL — ABNORMAL HIGH (ref 70–99)
Glucose-Capillary: 147 mg/dL — ABNORMAL HIGH (ref 70–99)

## 2023-11-27 LAB — BODY FLUID CULTURE W GRAM STAIN: Culture: NO GROWTH

## 2023-11-27 NOTE — Progress Notes (Signed)
 Heart Failure Navigator Progress Note  Assessed for Heart & Vascular TOC clinic readiness. Per notes this admission patient had a hemothorax with complete collapse of right lung, recent right thoracentesis on 11/16/2023.  Had a chest tube placed on 11/20/2023.  History of CHF with scheduled follow-up appointment with Nicolasa Ducking 12/06/2023 @ 2:45 PM. His wife, [name] is waiting to hear if he will be going to a SNF or Home health services.  Education Assessment and Provision:  Detailed education and instructions provided on heart failure disease management including the following:  Signs and symptoms of Heart Failure When to call the physician Importance of daily weights Low sodium diet Fluid restriction Medication management Anticipated future follow-up appointments  Patient education given on each of the above topics.  Patient acknowledges understanding via teach back method and acceptance of all instructions.  Education Materials:  "Living Better With Heart Failure" Booklet, HF zone tool, & Daily Weight Tracker Tool.  Patient has scale at home: Yes.  [name] his wife says they are currently not doing daily weights. Patient has pill box at home: Yes.  [name] prepares patients medications for him.    Navigator available for reassessment of patient if needed.  Navigator will sign off at this time since he has a scheduled follow-up with [facility] cardiology for post hospitalization.  Roxy Horseman, RN, BSN [facility] Heart Failure Navigator Secure Chat Only

## 2023-11-27 NOTE — Progress Notes (Signed)
 Physical Therapy Treatment Patient Details Name: [name] Young. MRN: [id number] DOB: Nov 21, 1940 Today's Date: 11/27/2023   History of Present Illness 83 year old male patient with a past medical history of recurrent pleural effusion status post multiple thoracentesis thought to be transudate presenting to [facility] for an outpatient left heart cath and right heart cath.  He was found to have a complete whiteout of the right hemithorax suspicious for a hemothorax.     Patient underwent thoracentesis on 03/07 and drained 800 cc of serous fluid.    PT Comments  Pt was long sitting in bed upon arrival. He is alert and agreeable but extremely slow moving. Likes to dictate session progression and is overall particular in how he wants to do it. Pt was able to exit bed (towards L), stand 3 x EOB with bed height elevated, prior to taking steps to [facility] for successful BM. Pt stood and then returned to bed afterwards. Pt still has chest tube in place with suction attached. He overall tolerated session well but is severely limited by fatigue. Pt reports only wanting to DC directly home with [facility] services. He will continue to benefit from skilled PT at DC to maximize independence and safety with all ADLs.    If plan is discharge home, recommend the following: A little help with walking and/or transfers;A little help with bathing/dressing/bathroom;Assistance with cooking/housework;Direct supervision/assist for medications management;Assist for transportation;Direct supervision/assist for financial management;Help with stairs or ramp for entrance     Equipment Recommendations  Rolling walker (2 wheels)       Precautions / Restrictions Precautions Precautions: Fall Precaution/Restrictions Comments: chest tube to suction R flank Restrictions Weight Bearing Restrictions Per Provider Order: No     Mobility  Bed Mobility Overal bed mobility: Needs Assistance Bed Mobility: Supine to Sit, Sit to Supine  Supine to  sit: Min assist Sit to supine: Min assist     Transfers Overall transfer level: Needs assistance Equipment used: Rolling walker (2 wheels) Transfers: Sit to/from Stand Sit to Stand: Contact guard assist  General transfer comment: pt stood 3 x EOB prior to taking steps to [facility]. Stood 1 x from [facility] prior to returning to bed    Ambulation/Gait Ambulation/Gait assistance: Contact guard assist Gait Distance (Feet): 4 Feet Assistive device: Rolling walker (2 wheels) Gait Pattern/deviations: Step-to pattern Gait velocity: decreased  General Gait Details: Distance limited by fatiguehowever pt was able to take steps along EOB 2 x. 1 x to FOB then 1 x from FOB to [facility]     Balance Overall balance assessment: Needs assistance Sitting-balance support: Feet supported Sitting balance-Leahy Scale: Good     Standing balance support: Bilateral upper extremity supported Standing balance-Leahy Scale: Fair     Hotel manager: No apparent difficulties  Cognition Arousal: Alert Behavior During Therapy: WFL for tasks assessed/performed   PT - Cognitive impairments: No apparent impairments  PT - Cognition Comments: pt constantly rquest more time to peform desired task requested of him Following commands: Intact      Cueing Cueing Techniques: Verbal cues, Visual cues, Tactile cues         Pertinent Vitals/Pain Pain Assessment Pain Assessment: 0-10 Pain Score: 3  Faces Pain Scale: Hurts a little bit Pain Location: back Pain Descriptors / Indicators: Discomfort Pain Intervention(s): Limited activity within patient's tolerance, Monitored during session, Premedicated before session, Repositioned     PT Goals (current goals can now be found in the care plan section) Acute Rehab PT Goals Patient Stated Goal: none  stated Progress towards PT goals: Progressing toward goals    Frequency    Min 2X/week       AM-PAC PT "6 Clicks" Mobility   Outcome Measure   Help needed turning from your back to your side while in a flat bed without using bedrails?: A Little Help needed moving from lying on your back to sitting on the side of a flat bed without using bedrails?: A Little Help needed moving to and from a bed to a chair (including a wheelchair)?: A Little Help needed standing up from a chair using your arms (e.g., wheelchair or bedside chair)?: A Little Help needed to walk in hospital room?: A Lot Help needed climbing 3-5 steps with a railing? : A Lot 6 Click Score: 16    End of Session   Activity Tolerance: Patient tolerated treatment well Patient left: in bed;with call bell/phone within reach Nurse Communication: Mobility status PT Visit Diagnosis: Muscle weakness (generalized) (M62.81);Difficulty in walking, not elsewhere classified (R26.2);Pain Pain - Right/Left: Right     Time: 1531-1600 PT Time Calculation (min) (ACUTE ONLY): 29 min  Charges:    $Therapeutic Activity: 23-37 mins PT General Charges $$ ACUTE PT VISIT: 1 Visit                    Jetta Lout PTA 11/27/23, 4:21 PM

## 2023-11-27 NOTE — Progress Notes (Signed)
 Progress Note    [name] Merl.  HQI:[id number] DOB: 1941/03/09  DOA: 11/20/2023 PCP: [name], MD      Brief Narrative:    Medical records reviewed and are as summarized below:  [name] Merl. is a 83 y.o. male  with medical history significant for chronic kidney disease stage III, BPH, HTN, DM2, paroxysmal atrial fibrillation (Eliquis), chronic diastolic heart failure with recurrent pleural effusions. He is familiar to IR from multiple thoracenteses with the last one on 11/16/23. 800 ml of yellow fluid was removed and post-procedure CXR was negative for a pneumothorax. Shortly after leaving the hospital he developed pleuritic right-sided chest pain that persisted.   He presented to the hospital on 11/20/2023 for right and left heart catheterization. Fluoroscopy of the right chest was performed before access was obtained and this showed a large right pleural effusion with near complete collapse of the right lung. The catheterization was cancelled and additional imaging obtained shows a hemothorax. His labs showed a 3 gm drop in hemoglobin.   He was admitted to the ICU for right hemothorax complicated by acute blood loss anemia.  Chest tube was placed in the right chest wall by interventional radiologist.  He was transfused 1 unit of PRBCs on 11/20/2023.  He was transferred from the ICU to [facility] on 11/22/2023.     Assessment/Plan:   Principal Problem:   Hemothorax Active Problems:   Coronary artery disease   PAF (paroxysmal atrial fibrillation) (HCC)   Aortic valve stenosis   Chronic diastolic CHF (congestive heart failure) (HCC)   Hemothorax on right    Body mass index is 32.27 kg/m.  (Obesity)   Right hemothorax with complete collapse of right lung, recent right thoracentesis on 11/16/2023: S/p right chest tube placement with about 1.7 L of bloody output on 11/20/2023 by IR.   Analgesics as needed for pain.  He has been getting  intrapleural tPA. Continue IV Zosyn for suspected empyema.  No growth on pleural fluid culture thus far.  Chest x-ray on 11/25/2022 showed stable low volume chest.  No pneumothorax or increasing pleural fluid. I reached out to pulmonologists ( Dr. Karna Christmas and Dr. Jayme Cloud) via secure chat for further recommendations.   Acute blood loss anemia: H&H is stable.  Hemoglobin 8.3.  Monitor H&H and transfuse as needed s/p transfusion 1 unit of PRBCs on 11/20/2023 for hemoglobin of 7.2.   AKI: Improved.  Repeat BMP tomorrow. Creatinine slowly trending upward, 1.38-1.48. Creatinine was 1.71 on admission.    Acute hypoxic respiratory failure: Improved.  He is tolerating room air.   Paroxysmal atrial fibrillation, nonsustained VT, frequent PVCs on telemetry: Continue metoprolol.  Eliquis has been held.   Chronic diastolic CHF, CAD, moderate aortic valve stenosis compensated. Continue Lasix.  Hold metolazone. 2D echo showed EF estimated at 40 to 45%, grade 1 diastolic dysfunction, mild LVH, mild MR, mild AR, mild to moderate AAS. 2D echo in April 2024 showed EF estimated at 50 to 55%.   General Weakness: PT and OT recommended SNF at discharge.   Constipation: Laxatives as needed    Diet Order             Diet heart healthy/carb modified Room service appropriate? Yes; Fluid consistency: Thin  Diet effective now                            Consultants: Interventional radiologist Cardiologist Pulmonologist  Procedures:  Right chest tube placement on 11/20/2023    Medications:    sodium chloride   Intravenous Once   acetaminophen  650 mg Oral Q6H   Chlorhexidine Gluconate Cloth  6 each Topical Daily   furosemide  40 mg Oral Daily   insulin aspart  0-15 Units Subcutaneous TID WC   insulin aspart  0-5 Units Subcutaneous QHS   metoprolol tartrate  25 mg Oral BID   mirtazapine  15 mg Oral QHS   polyethylene glycol  17 g Oral Daily   rosuvastatin  10 mg Oral Daily    senna  2 tablet Oral QHS   Continuous Infusions:  piperacillin-tazobactam (ZOSYN)  IV 3.375 g (11/27/23 0916)     Anti-infectives (From admission, onward)    Start     Dose/Rate Route Frequency Ordered Stop   11/23/23 1000  piperacillin-tazobactam (ZOSYN) IVPB 3.375 g        3.375 g 12.5 mL/hr over 240 Minutes Intravenous Every 8 hours 11/23/23 0908                Family Communication/Anticipated D/C date and plan/Code Status   DVT prophylaxis: SCDs Start: 11/20/23 1353     Code Status: Full Code  Family Communication: Plan discussed with his wife at the bedside Disposition Plan: Plan to discharge to SNF   Status is: Inpatient Remains inpatient appropriate because: Right-sided hemithorax       Subjective:   Interval events noted.  He feels better.  No chest pain, shortness of breath or cough.  His wife was at the bedside.  Objective:    Vitals:   11/26/23 2022 11/26/23 2342 11/27/23 0514 11/27/23 0850  BP: (!) 104/53 106/66 124/80 105/64  Pulse: 73 75 99 73  Resp: 18 18 18    Temp: 98.2 F (36.8 C) 98.2 F (36.8 C) 98.2 F (36.8 C) 98.7 F (37.1 C)  TempSrc:    Oral  SpO2: 97% 97% 95% 95%  Weight:      Height:       No data found.   Intake/Output Summary (Last 24 hours) at 11/27/2023 1404 Last data filed at 11/27/2023 1136 Gross per 24 hour  Intake 730 ml  Output 320 ml  Net 410 ml   Filed Weights   11/24/23 0224 11/25/23 0500 11/26/23 0500  Weight: 99.6 kg 102.6 kg 102 kg    Exam:  GEN: NAD SKIN: Warm and dry EYES: No pallor or icterus ENT: MMM CV: RRR PULM: Chest tube in the right lateral chest wall with some bloody drainage.  No wheezing or rales. ABD: soft, ND, NT, +BS CNS: AAO x 3, non focal EXT: No edema or tenderness     Data Reviewed:   I have personally reviewed following labs and imaging studies:  Labs: Labs show the following:   Basic Metabolic Panel: Recent Labs  Lab 11/21/23 0239 11/22/23 0330  11/22/23 1450 11/23/23 0407 11/24/23 0404 11/25/23 0443 11/26/23 0657  NA 134* 135  --  135 133* 135 137  K 4.2 4.4 4.3 4.8 4.2 3.8 3.4*  CL 94* 95*  --  94* 93* 93* 94*  CO2 31 31  --  29 31 30 30   GLUCOSE 150* 145*  --  152* 141* 128* 138*  BUN 67* 62*  --  62* 60* 61* 50*  CREATININE 1.45* 1.27*  --  1.29* 1.35* 1.38* 1.48*  CALCIUM 7.8* 7.7*  --  8.4* 7.6* 7.6* 7.8*  MG 3.1* 3.0* 3.0* 3.0* 3.0* 3.0* 2.7*  PHOS 4.3 4.2  --   --   --  4.5 3.9   GFR Estimated Creatinine Clearance: 46.8 mL/min (A) (by C-G formula based on SCr of 1.48 mg/dL (H)). Liver Function Tests: Recent Labs  Lab 11/21/23 0239 11/22/23 0330 11/25/23 0443  ALBUMIN 2.2* 2.1* 1.9*   No results for input(s): "LIPASE", "AMYLASE" in the last 168 hours. No results for input(s): "AMMONIA" in the last 168 hours. Coagulation profile Recent Labs  Lab 11/21/23 0902  INR 1.4*    CBC: Recent Labs  Lab 11/21/23 0239 11/21/23 1326 11/23/23 0406 11/23/23 0407 11/24/23 0404 11/25/23 0443 11/26/23 0657  WBC 11.8*   < > 14.5* 14.7* 13.0* 11.8* 11.6*  NEUTROABS 8.6*  --  11.2*  --   --   --   --   HGB 8.1*   < > 9.4* 9.4* 8.7* 8.1* 8.3*  HCT 25.9*   < > 30.8* 30.3* 28.4* 26.1* 27.1*  MCV 86.3   < > 87.3 84.6 86.3 85.6 86.6  PLT 345   < > 451* 438* 398 391 374   < > = values in this interval not displayed.   Cardiac Enzymes: No results for input(s): "CKTOTAL", "CKMB", "CKMBINDEX", "TROPONINI" in the last 168 hours. BNP (last 3 results) Recent Labs    12/21/22 1455 04/25/23 1516  PROBNP 357.0* 142.0*   CBG: Recent Labs  Lab 11/26/23 1302 11/26/23 1730 11/26/23 2157 11/27/23 0844 11/27/23 1234  GLUCAP 233* 170* 138* 191* 218*   D-Dimer: No results for input(s): "DDIMER" in the last 72 hours. Hgb A1c: No results for input(s): "HGBA1C" in the last 72 hours. Lipid Profile: No results for input(s): "CHOL", "HDL", "LDLCALC", "TRIG", "CHOLHDL", "LDLDIRECT" in the last 72 hours. Thyroid function  studies: No results for input(s): "TSH", "T4TOTAL", "T3FREE", "THYROIDAB" in the last 72 hours.  Invalid input(s): "FREET3" Anemia work up: No results for input(s): "VITAMINB12", "FOLATE", "FERRITIN", "TIBC", "IRON", "RETICCTPCT" in the last 72 hours. Sepsis Labs: Recent Labs  Lab 11/23/23 0407 11/24/23 0404 11/25/23 0443 11/26/23 0657  PROCALCITON  --   --  0.32  --   WBC 14.7* 13.0* 11.8* 11.6*    Microbiology Recent Results (from the past 240 hours)  Body fluid culture w Gram Stain     Status: None   Collection Time: 11/23/23  9:10 AM   Specimen: Pleura; Body Fluid  Result Value Ref Range Status   Specimen Description   Final    PLEURAL Performed at [facility], [address] [place], [place] [address]    Special Requests   Final    NONE Performed at [facility], [address] [place], [place] [address]    Gram Stain   Final    FEW WBC PRESENT, PREDOMINANTLY PMN NO ORGANISMS SEEN    Culture   Final    NO GROWTH 3 DAYS Performed at [facility], [address], [place], [place] [address]    Report Status 11/27/2023 FINAL  Final  MRSA Next Gen by PCR, Nasal     Status: None   Collection Time: 11/23/23  2:18 PM   Specimen: Nasal Mucosa; Nasal Swab  Result Value Ref Range Status   MRSA by PCR Next Gen NOT DETECTED NOT DETECTED Final    Comment: (NOTE) The GeneXpert MRSA Assay (FDA approved for NASAL specimens only), is one component of a comprehensive MRSA colonization surveillance program. It is not intended to diagnose MRSA infection nor to guide or monitor treatment for  MRSA infections. Test performance is not FDA approved in patients less than 26 years old. Performed at [facility], [address] [place], [place] [address]   Expectorated Sputum Assessment w Gram Stain, Rflx to Resp Cult     Status: None   Collection Time: 11/23/23  6:17 PM   Specimen: Sputum  Result Value Ref Range Status   Specimen Description  SPU EXPECTORATED SPUTUM  Final   Special Requests NONE  Final   Sputum evaluation   Final    THIS SPECIMEN IS ACCEPTABLE FOR SPUTUM CULTURE Performed at [facility], [address], [place], [place] [address]    Report Status 11/23/2023 FINAL  Final  Culture, Respiratory w Gram Stain     Status: None   Collection Time: 11/23/23  6:17 PM   Specimen: Sputum  Result Value Ref Range Status   Specimen Description   Final    SPU EXPECTORATED SPUTUM Performed at [facility], [address] [place], [place] [address]    Special Requests   Final    NONE Reflexed from [id number] Performed at [facility], [address] [place], [place] [address]    Gram Stain   Final    WBC PRESENT, PREDOMINANTLY PMN RARE GRAM POSITIVE COCCI    Culture   Final    RARE Normal respiratory flora-no Staph aureus or Pseudomonas seen Performed at [facility], [address], [place], [place] [address]    Report Status 11/26/2023 FINAL  Final    Procedures and diagnostic studies:  No results found.              LOS: 7 days   [name] Young  [facility]   Pager on [email or web address]. If 7PM-7AM, please contact night-coverage at [email or web address]     11/27/2023, 2:04 PM

## 2023-11-27 NOTE — Progress Notes (Signed)
 NAME:  [name] Young., MRN:  [id number], DOB:  Dec 13, 1940, LOS: 7 ADMISSION DATE:  11/20/2023  History of Present Illness:  [name] Young is a pleasant 83 year old male patient with a past medical history of recurrent pleural effusion status post multiple thoracentesis thought to be transudate presenting to [facility] for an outpatient left heart cath and right heart cath.  He was found to have a complete whiteout of the right hemithorax suspicious for a hemothorax.  Patient underwent thoracentesis on 03/07 and drained 800 cc of serous fluid.  And since then he has been complaining of sharp right-sided chest pain associated with worsening shortness of breath.  He presents today for a left heart cath and was noted on fluoroscopy to have a near complete whiteout of the right hemithorax.  Therefore left heart cath was canceled.  Labs were noticeable for drop in hemoglobin from 10 g/dL at baseline to 7.1 g/dL at baseline.  A CT scan without contrast of the chest was obtained which showed a large right pleural effusion with heterogeneous densities highly suspicious for a hemothorax.  IR graciously placed a right 38 [place] skater with 1.8 L of serosanguineous fluid drained.  He remained hemodynamically throughout procedure.   11/27/23- patient had 70cc output from chest tube during day shift today.  IR had evaluated him and we discussed his pleural output.  Id like to hold removal of chest tube until it is less then 50cc/24h.  We will hold off from removal of pleural catheter today. Will continue to follow patient.   Pertinent  Medical History  As above  Significant Hospital Events: Including procedures, antibiotic start and stop dates in addition to other pertinent events   11/20/2023 Admitted to the ICU s/p right 16 Fr Skater. 1.8L Serosanguinous fluid drained.  11/21/23: Tpa 10mg  Given intrapleural, output was 1800cc yesterday of sanguinous material. CXR appeared improved.  11/25/2023 Chest tube  remains with significant output now at 800cc per 24hrs.     Objective   Blood pressure 105/64, pulse 73, temperature 98.7 F (37.1 C), temperature source Oral, resp. rate 18, height 5\' 10"  (1.778 m), weight 102 kg, SpO2 95%.        Intake/Output Summary (Last 24 hours) at 11/27/2023 1549 Last data filed at 11/27/2023 1136 Gross per 24 hour  Intake 610 ml  Output 320 ml  Net 290 ml   Filed Weights   11/24/23 0224 11/25/23 0500 11/26/23 0500  Weight: 99.6 kg 102.6 kg 102 kg    Examination: General: Alert awake  and oriented. HD stable.  HENT: Supple neck reactive pupils Lungs: Improved breath sounds over the left hemithorax.  Cardiovascular: Normal S1, Normal S2, RRR, no murmurs or ES appreciated Abdomen: Soft, non tender, non distended, +BS  Extremities: Warm and well perfused.   Labs and imaging were reviewed.   Assessment & Plan:  [name] is a pleasant 83 year old male patient with a past medical history of recurrent pleural effusion status post multiple thoracentesis thought to be transudate presenting to [facility] for an outpatient left heart cath and right heart cath.  He was found to have a complete whiteout of the right hemithorax suspicious for a hemothorax.  #Acute hypoxic respiratory failure secondary to...  #Large right hemothorax in the setting of recent thoracentesis on 11/16/2023 while being on Eliquis. S/p 16Fr Skater placement on 03/11. Appears.  #AKI lprerenal in the setting acute anemia  #Acute Anemia -from hemothorax #Paroxysmal A.fib on eliquis.   Improved overall however remain with  significant drainage of 300cc. CXR this am with improved aeration in the right lower lobe.  []  CT to suction at -20cmH2O>>WATERSEAL - STILL HAS >50CC/DAY OUTPUT []  Should he show any worsening output, hemodynamic instability or drop in hemogloibin, CTA chest should be obtained and IR should be contacted for possible embolization. If that is not feasible than [facility]  after [facility] for thoracic surgery eval should be pursued.  []  Restart home diuretics [ fluid is exudative BECAUSE IT IS DIURESED BLOOD, HOWEVER GLUCOSE IS NORMAL AND GRAM STAIN IS NEGATIVE SO THIS IS REASSURING []  Daily CBC  []  Fluid for culutre and cell count no growth todate []  Culture megative so far. Procal in the am.   Pulmonary will follow closely.  Please do not hesitate to reach out with any concerns.   I spent 42  minutes caring for this patient today, including preparing to see the patient, obtaining a medical history , reviewing a separately obtained history, performing a medically appropriate examination and/or evaluation, counseling and educating the patient/family/caregiver, ordering medications, tests, or procedures, documenting clinical information in the electronic health record, and independently interpreting results (not separately reported/billed) and communicating results to the patient/family/caregiver  Vida Rigger, MD 11/27/2023 3:49 PM

## 2023-11-27 NOTE — Plan of Care (Signed)
  Problem: Education: Goal: Understanding of CV disease, CV risk reduction, and recovery process will improve Outcome: Progressing Goal: Individualized Educational Video(s) Outcome: Progressing   Problem: Activity: Goal: Ability to return to baseline activity level will improve Outcome: Progressing   Problem: Cardiovascular: Goal: Ability to achieve and maintain adequate cardiovascular perfusion will improve Outcome: Progressing Goal: Vascular access site(s) Level 0-1 will be maintained Outcome: Progressing   Problem: Health Behavior/Discharge Planning: Goal: Ability to safely manage health-related needs after discharge will improve Outcome: Progressing   Problem: Education: Goal: Knowledge of General Education information will improve Description: Including pain rating scale, medication(s)/side effects and non-pharmacologic comfort measures Outcome: Progressing   Problem: Health Behavior/Discharge Planning: Goal: Ability to manage health-related needs will improve Outcome: Progressing   Problem: Clinical Measurements: Goal: Ability to maintain clinical measurements within normal limits will improve Outcome: Progressing Goal: Will remain free from infection Outcome: Progressing Goal: Diagnostic test results will improve Outcome: Progressing Goal: Respiratory complications will improve Outcome: Progressing Goal: Cardiovascular complication will be avoided Outcome: Progressing   Problem: Activity: Goal: Risk for activity intolerance will decrease Outcome: Progressing   Problem: Nutrition: Goal: Adequate nutrition will be maintained Outcome: Progressing   Problem: Coping: Goal: Level of anxiety will decrease Outcome: Progressing   Problem: Elimination: Goal: Will not experience complications related to bowel motility Outcome: Progressing Goal: Will not experience complications related to urinary retention Outcome: Progressing   Problem: Pain Managment: Goal:  General experience of comfort will improve and/or be controlled Outcome: Progressing   Problem: Safety: Goal: Ability to remain free from injury will improve Outcome: Progressing   Problem: Skin Integrity: Goal: Risk for impaired skin integrity will decrease Outcome: Progressing   Problem: Education: Goal: Ability to describe self-care measures that may prevent or decrease complications (Diabetes Survival Skills Education) will improve Outcome: Progressing Goal: Individualized Educational Video(s) Outcome: Progressing   Problem: Coping: Goal: Ability to adjust to condition or change in health will improve Outcome: Progressing   Problem: Fluid Volume: Goal: Ability to maintain a balanced intake and output will improve Outcome: Progressing   Problem: Health Behavior/Discharge Planning: Goal: Ability to identify and utilize available resources and services will improve Outcome: Progressing Goal: Ability to manage health-related needs will improve Outcome: Progressing   Problem: Metabolic: Goal: Ability to maintain appropriate glucose levels will improve Outcome: Progressing   Problem: Nutritional: Goal: Maintenance of adequate nutrition will improve Outcome: Progressing Goal: Progress toward achieving an optimal weight will improve Outcome: Progressing   Problem: Skin Integrity: Goal: Risk for impaired skin integrity will decrease Outcome: Progressing   Problem: Tissue Perfusion: Goal: Adequacy of tissue perfusion will improve Outcome: Progressing

## 2023-11-28 DIAGNOSIS — J942 Hemothorax: Secondary | ICD-10-CM | POA: Diagnosis not present

## 2023-11-28 LAB — BASIC METABOLIC PANEL
Anion gap: 8 (ref 5–15)
BUN: 54 mg/dL — ABNORMAL HIGH (ref 8–23)
CO2: 31 mmol/L (ref 22–32)
Calcium: 7.5 mg/dL — ABNORMAL LOW (ref 8.9–10.3)
Chloride: 97 mmol/L — ABNORMAL LOW (ref 98–111)
Creatinine, Ser: 1.52 mg/dL — ABNORMAL HIGH (ref 0.61–1.24)
GFR, Estimated: 46 mL/min — ABNORMAL LOW (ref >60)
Glucose, Bld: 114 mg/dL — ABNORMAL HIGH (ref 70–99)
Potassium: 3.1 mmol/L — ABNORMAL LOW (ref 3.5–5.1)
Sodium: 136 mmol/L (ref 135–145)

## 2023-11-28 LAB — CBC
HCT: 27.2 % — ABNORMAL LOW (ref 39.0–52.0)
Hemoglobin: 8.2 g/dL — ABNORMAL LOW (ref 13.0–17.0)
MCH: 25.9 pg — ABNORMAL LOW (ref 26.0–34.0)
MCHC: 30.1 g/dL (ref 30.0–36.0)
MCV: 85.8 fL (ref 80.0–100.0)
Platelets: 373 10*3/uL (ref 150–400)
RBC: 3.17 MIL/uL — ABNORMAL LOW (ref 4.22–5.81)
RDW: 18.5 % — ABNORMAL HIGH (ref 11.5–15.5)
WBC: 10.6 10*3/uL — ABNORMAL HIGH (ref 4.0–10.5)
nRBC: 0 % (ref 0.0–0.2)

## 2023-11-28 LAB — GLUCOSE, CAPILLARY
Glucose-Capillary: 124 mg/dL — ABNORMAL HIGH (ref 70–99)
Glucose-Capillary: 199 mg/dL — ABNORMAL HIGH (ref 70–99)
Glucose-Capillary: 136 mg/dL — ABNORMAL HIGH (ref 70–99)
Glucose-Capillary: 150 mg/dL — ABNORMAL HIGH (ref 70–99)

## 2023-11-28 MED ORDER — ENSURE ENLIVE PO LIQD
237.0000 mL | Freq: Two times a day (BID) | ORAL | Status: DC
  Administered 2023-11-28 – 2023-11-29 (×2): 237 mL via ORAL

## 2023-11-28 NOTE — Progress Notes (Signed)
 Progress Note    [name] Merl.  WUJ:[id number] DOB: 1941-05-22  DOA: 11/20/2023 PCP: [name], MD      Brief Narrative:    Medical records reviewed and are as summarized below:  [name] Merl. is a 83 y.o. male  with medical history significant for chronic kidney disease stage III, BPH, HTN, DM2, paroxysmal atrial fibrillation (Eliquis), chronic diastolic heart failure with recurrent pleural effusions. He is familiar to IR from multiple thoracenteses with the last one on 11/16/23. 800 ml of yellow fluid was removed and post-procedure CXR was negative for a pneumothorax. Shortly after leaving the hospital he developed pleuritic right-sided chest pain that persisted.   He presented to the hospital on 11/20/2023 for right and left heart catheterization. Fluoroscopy of the right chest was performed before access was obtained and this showed a large right pleural effusion with near complete collapse of the right lung. The catheterization was cancelled and additional imaging obtained shows a hemothorax. His labs showed a 3 gm drop in hemoglobin.   He was admitted to the ICU for right hemothorax complicated by acute blood loss anemia.  Chest tube was placed in the right chest wall by interventional radiologist.  He was transfused 1 unit of PRBCs on 11/20/2023.  He was transferred from the ICU to [facility] on 11/22/2023.     Assessment/Plan:   Principal Problem:   Hemothorax Active Problems:   Coronary artery disease   PAF (paroxysmal atrial fibrillation) (HCC)   Aortic valve stenosis   Chronic diastolic CHF (congestive heart failure) (HCC)   Hemothorax on right    Body mass index is 32.65 kg/m.  (Obesity)   Right hemothorax with complete collapse of right lung, recent right thoracentesis on 11/16/2023: S/p right chest tube placement with about 1.7 L of bloody output on 11/20/2023 by IR.   Analgesics as needed for pain.  He has been getting  intrapleural tPA. Continue IV Zosyn for suspected empyema.   No growth on pleural fluid culture thus far.  Chest x-ray on 11/25/2022 showed stable low volume chest.   No pneumothorax or increasing pleural fluid. Pulmonology is following for chest tube management. IR to be consulted for removal when output below 50 cc / 24 hr   Acute blood loss anemia: H&H is stable.  Hemoglobin 8.3.  Monitor H&H and transfuse as needed s/p transfusion 1 unit of PRBCs on 11/20/2023 for hemoglobin of 7.2.   AKI: Improved.  Repeat BMP tomorrow. Creatinine slowly trending upward, 1.38-1.48. Creatinine was 1.71 on admission.    Acute hypoxic respiratory failure: Improved.  He is tolerating room air.   Paroxysmal atrial fibrillation, nonsustained VT, frequent PVCs on telemetry: Continue metoprolol.  Eliquis has been held.   Chronic diastolic CHF, CAD, moderate aortic valve stenosis compensated. Continue Lasix.  Hold metolazone. 2D echo showed EF estimated at 40 to 45%, grade 1 diastolic dysfunction, mild LVH, mild MR, mild AR, mild to moderate AAS. 2D echo in April 2024 showed EF estimated at 50 to 55%.   General Weakness: PT and OT recommended SNF at discharge.   Constipation: Laxatives as needed    Consultants: Interventional radiologist Cardiologist Pulmonologist  Procedures: Right chest tube placement on 11/20/2023    Medications:    sodium chloride   Intravenous Once   acetaminophen  650 mg Oral Q6H   Chlorhexidine Gluconate Cloth  6 each Topical Daily   feeding supplement  237 mL Oral BID BM   furosemide  40 mg Oral Daily   insulin aspart  0-15 Units Subcutaneous TID WC   insulin aspart  0-5 Units Subcutaneous QHS   metoprolol tartrate  25 mg Oral BID   mirtazapine  15 mg Oral QHS   polyethylene glycol  17 g Oral Daily   rosuvastatin  10 mg Oral Daily   senna  2 tablet Oral QHS   Continuous Infusions:  piperacillin-tazobactam (ZOSYN)  IV 3.375 g (11/28/23 0817)      Anti-infectives (From admission, onward)    Start     Dose/Rate Route Frequency Ordered Stop   11/23/23 1000  piperacillin-tazobactam (ZOSYN) IVPB 3.375 g        3.375 g 12.5 mL/hr over 240 Minutes Intravenous Every 8 hours 11/23/23 0908                Family Communication/Anticipated D/C date and plan/Code Status   DVT prophylaxis: SCDs Start: 11/20/23 1353     Code Status: Full Code  Family Communication: Plan discussed with his wife at the bedside Disposition Plan: Plan to discharge to SNF vs HH   Status is: Inpatient Remains inpatient appropriate because: Right-sided hemithorax with chest tube still in place.       Subjective:   Pt seen with wife at bedside this AM.  No acute events reported.  They are hopeful tube can be removed soon.  Pt's only complaint is nothing he tries to eat tastes good, so not eating very much at all.  Asking for Ensures.  Reports good appetite, just nothing tastes right.  Objective:    Vitals:   11/28/23 0344 11/28/23 0500 11/28/23 0803 11/28/23 1213  BP: (!) 100/49  (!) 110/50 (!) 110/41  Pulse: 70  77 66  Resp: 18     Temp: (!) 97.4 F (36.3 C)  98.4 F (36.9 C) 98.3 F (36.8 C)  TempSrc: Oral  Oral   SpO2: 95%  95% 97%  Weight:  103.2 kg    Height:       No data found.   Intake/Output Summary (Last 24 hours) at 11/28/2023 1450 Last data filed at 11/28/2023 1418 Gross per 24 hour  Intake 860 ml  Output 660 ml  Net 200 ml   Filed Weights   11/25/23 0500 11/26/23 0500 11/28/23 0500  Weight: 102.6 kg 102 kg 103.2 kg    Exam:  General exam: awake, alert, no acute distress HEENT: moist mucus membranes, hearing grossly normal  Respiratory system: CTAB, right chest tube in place with serosanguinous fluid in canister and tubing,, no wheezes, normal respiratory effort. Cardiovascular system: normal S1/S2, RRR Gastrointestinal system: soft, NT, ND, no HSM felt, +bowel sounds. Central nervous system: A&O x 3.  no gross focal neurologic deficits, normal speech Extremities: moves all, no edema, normal tone Skin: dry, intact, normal temperature Psychiatry: normal mood, congruent affect, judgement and insight appear normal      Data Reviewed:   Notable labs -- K 3.1, Cl 97, Glucose 114, BUN 54, Cr 1.52, Ca 7.5, WBC 10.6, Hbg 8.2  CBG's at goal     LOS: 8 days   [name]  [facility]   Pager on [email or web address]. If 7PM-7AM, please contact night-coverage at [email or web address]     11/28/2023, 2:50 PM

## 2023-11-28 NOTE — Progress Notes (Addendum)
 NAME:  [name] Young., MRN:  [id number], DOB:  1941/04/18, LOS: 8 ADMISSION DATE:  11/20/2023  History of Present Illness:  [name] Young is a pleasant 83 year old male patient with a past medical history of recurrent pleural effusion status post multiple thoracentesis thought to be transudate presenting to [facility] for an outpatient left heart cath and right heart cath.  He was found to have a complete whiteout of the right hemithorax suspicious for a hemothorax.  Patient underwent thoracentesis on 03/07 and drained 800 cc of serous fluid.  And since then he has been complaining of sharp right-sided chest pain associated with worsening shortness of breath.  He presents today for a left heart cath and was noted on fluoroscopy to have a near complete whiteout of the right hemithorax.  Therefore left heart cath was canceled.  Labs were noticeable for drop in hemoglobin from 10 g/dL at baseline to 7.1 g/dL at baseline.  A CT scan without contrast of the chest was obtained which showed a large right pleural effusion with heterogeneous densities highly suspicious for a hemothorax.  IR graciously placed a right 30 [place] skater with 1.8 L of serosanguineous fluid drained.  He remained hemodynamically throughout procedure.   11/27/23- patient had 70cc output from chest tube during day shift today.  IR had evaluated him and we discussed his pleural output.  Id like to hold removal of chest tube until it is less then 50cc/24h.  We will hold off from removal of pleural catheter today. Will continue to follow patient.  11/28/23- patient had appx 50cc serosanguinous output from chest tube today.  I met with wife [name] and we reviewed his hospital course.  He has had thoracentesis multiple times over past 6 months.  Etiology appears to be Global hypokinesis of LVEV with concomitant CKD. We will plan to clamp tube today and image chest in am for evaluation of pleural fluid. I flushed the tube today to make sure  its patent.   CXR in am.  Chest tube clamped today for evaluation of pleural fluid prior to removal ov chest tube.  Pertinent  Medical History  As above  Significant Hospital Events: Including procedures, antibiotic start and stop dates in addition to other pertinent events   11/20/2023 Admitted to the ICU s/p right 16 Fr Skater. 1.8L Serosanguinous fluid drained.  11/21/23: Tpa 10mg  Given intrapleural, output was 1800cc yesterday of sanguinous material. CXR appeared improved.  11/25/2023 Chest tube remains with significant output now at 800cc per 24hrs.     Objective   Blood pressure (!) 110/50, pulse 77, temperature 98.4 F (36.9 C), temperature source Oral, resp. rate 18, height 5\' 10"  (1.778 m), weight 103.2 kg, SpO2 95%.        Intake/Output Summary (Last 24 hours) at 11/28/2023 0926 Last data filed at 11/28/2023 0813 Gross per 24 hour  Intake 860 ml  Output 660 ml  Net 200 ml   Filed Weights   11/25/23 0500 11/26/23 0500 11/28/23 0500  Weight: 102.6 kg 102 kg 103.2 kg    Examination: General: Alert awake  and oriented. HD stable.  HENT: Supple neck reactive pupils Lungs: Improved breath sounds over the left hemithorax.  Cardiovascular: Normal S1, Normal S2, RRR, no murmurs or ES appreciated Abdomen: Soft, non tender, non distended, +BS  Extremities: Warm and well perfused.   Labs and imaging were reviewed.   Assessment & Plan:  [name] Young is a pleasant 83 year old male patient with a past medical history of  recurrent pleural effusion status post multiple thoracentesis thought to be transudate presenting to [facility] for an outpatient left heart cath and right heart cath.  He was found to have a complete whiteout of the right hemithorax suspicious for a hemothorax.  #Acute hypoxic respiratory failure secondary to...  #Large right hemothorax in the setting of recent thoracentesis on 11/16/2023 while being on Eliquis. S/p 16Fr Skater placement on 03/11. Appears.  #AKI  lprerenal in the setting acute anemia  #Acute Anemia -from hemothorax #Paroxysmal A.fib on eliquis.   Improved overall however remain with significant drainage of 300cc. CXR this am with improved aeration in the right lower lobe.  []  CT to suction at -20cmH2O>>WATERSEAL - STILL HAS >50CC/DAY OUTPUT []  Should he show any worsening output, hemodynamic instability or drop in hemogloibin, CTA chest should be obtained and IR should be contacted for possible embolization. If that is not feasible than transfer to [facility] after [facility] for thoracic surgery eval should be pursued.  []  Restart home diuretics [ fluid is exudative BECAUSE IT IS DIURESED BLOOD, HOWEVER GLUCOSE IS NORMAL AND GRAM STAIN IS NEGATIVE SO THIS IS REASSURING.  Etiology is CHF and CKD []  Daily CBC  []  Fluid for culutre and cell count no growth todate []  Culture negative so far. Procal flat  Pulmonary will follow closely.  Please do not hesitate to reach out with any concerns.   I spent 41  minutes caring for this patient today, including preparing to see the patient, obtaining a medical history , reviewing a separately obtained history, performing a medically appropriate examination and/or evaluation, counseling and educating the patient/family/caregiver, ordering medications, tests, or procedures, documenting clinical information in the electronic health record, and independently interpreting results (not separately reported/billed) and communicating results to the patient/family/caregiver  Vida Rigger, MD 11/28/2023 9:26 AM

## 2023-11-28 NOTE — TOC Progression Note (Signed)
 Transition of Care (TOC) - Progression Note    Patient Details  Name: [name] Young. MRN: [id number] Date of Birth: 1941-03-31  Transition of Care [facility]) CM/SW Contact  Truddie Hidden, RN Phone Number: 11/28/2023, 4:28 PM  Clinical Narrative:    Spoke with patient and his wife at the bedside. Patient will return home at discharge with [facility]. Patient wife would like [place] for [facility]. She will transport patient home.   Referral sent to [facility] from [place]   Expected Discharge Plan: Home w Home Health Services Barriers to Discharge: Continued Medical Work up  Expected Discharge Plan and Services       Living arrangements for the past 2 months: Single Family Home                                       Social Determinants of Health (SDOH) Interventions SDOH Screenings   Food Insecurity: No Food Insecurity (11/27/2023)  Housing: Low Risk  (11/27/2023)  Transportation Needs: No Transportation Needs (11/27/2023)  Utilities: Not At Risk (11/20/2023)  Depression (PHQ2-9): Low Risk  (10/08/2023)  Financial Resource Strain: Low Risk  (11/27/2023)  Physical Activity: Unknown (10/08/2023)  Social Connections: Moderately Isolated (11/20/2023)  Stress: Patient Declined (10/08/2023)  Tobacco Use: Medium Risk (11/27/2023)    Readmission Risk Interventions     No data to display

## 2023-11-28 NOTE — Plan of Care (Signed)
  Problem: Education: Goal: Understanding of CV disease, CV risk reduction, and recovery process will improve Outcome: Progressing Goal: Individualized Educational Video(s) Outcome: Progressing   Problem: Activity: Goal: Ability to return to baseline activity level will improve Outcome: Progressing   Problem: Cardiovascular: Goal: Ability to achieve and maintain adequate cardiovascular perfusion will improve Outcome: Progressing Goal: Vascular access site(s) Level 0-1 will be maintained Outcome: Progressing   Problem: Health Behavior/Discharge Planning: Goal: Ability to safely manage health-related needs after discharge will improve Outcome: Progressing   Problem: Education: Goal: Knowledge of General Education information will improve Description: Including pain rating scale, medication(s)/side effects and non-pharmacologic comfort measures Outcome: Progressing   Problem: Health Behavior/Discharge Planning: Goal: Ability to manage health-related needs will improve Outcome: Progressing   Problem: Clinical Measurements: Goal: Ability to maintain clinical measurements within normal limits will improve Outcome: Progressing Goal: Will remain free from infection Outcome: Progressing Goal: Diagnostic test results will improve Outcome: Progressing Goal: Respiratory complications will improve Outcome: Progressing Goal: Cardiovascular complication will be avoided Outcome: Progressing   Problem: Activity: Goal: Risk for activity intolerance will decrease Outcome: Progressing   Problem: Nutrition: Goal: Adequate nutrition will be maintained Outcome: Progressing   Problem: Coping: Goal: Level of anxiety will decrease Outcome: Progressing   Problem: Elimination: Goal: Will not experience complications related to bowel motility Outcome: Progressing Goal: Will not experience complications related to urinary retention Outcome: Progressing   Problem: Pain Managment: Goal:  General experience of comfort will improve and/or be controlled Outcome: Progressing   Problem: Safety: Goal: Ability to remain free from injury will improve Outcome: Progressing   Problem: Skin Integrity: Goal: Risk for impaired skin integrity will decrease Outcome: Progressing   Problem: Education: Goal: Ability to describe self-care measures that may prevent or decrease complications (Diabetes Survival Skills Education) will improve Outcome: Progressing Goal: Individualized Educational Video(s) Outcome: Progressing   Problem: Coping: Goal: Ability to adjust to condition or change in health will improve Outcome: Progressing   Problem: Fluid Volume: Goal: Ability to maintain a balanced intake and output will improve Outcome: Progressing   Problem: Health Behavior/Discharge Planning: Goal: Ability to identify and utilize available resources and services will improve Outcome: Progressing Goal: Ability to manage health-related needs will improve Outcome: Progressing   Problem: Metabolic: Goal: Ability to maintain appropriate glucose levels will improve Outcome: Progressing   Problem: Nutritional: Goal: Maintenance of adequate nutrition will improve Outcome: Progressing Goal: Progress toward achieving an optimal weight will improve Outcome: Progressing   Problem: Skin Integrity: Goal: Risk for impaired skin integrity will decrease Outcome: Progressing   Problem: Tissue Perfusion: Goal: Adequacy of tissue perfusion will improve Outcome: Progressing

## 2023-11-29 ENCOUNTER — Inpatient Hospital Stay

## 2023-11-29 DIAGNOSIS — J942 Hemothorax: Secondary | ICD-10-CM | POA: Diagnosis not present

## 2023-11-29 LAB — BASIC METABOLIC PANEL
Anion gap: 12 (ref 5–15)
BUN: 56 mg/dL — ABNORMAL HIGH (ref 8–23)
CO2: 33 mmol/L — ABNORMAL HIGH (ref 22–32)
Calcium: 8 mg/dL — ABNORMAL LOW (ref 8.9–10.3)
Chloride: 94 mmol/L — ABNORMAL LOW (ref 98–111)
Creatinine, Ser: 1.32 mg/dL — ABNORMAL HIGH (ref 0.61–1.24)
GFR, Estimated: 54 mL/min — ABNORMAL LOW (ref >60)
Glucose, Bld: 173 mg/dL — ABNORMAL HIGH (ref 70–99)
Potassium: 3.9 mmol/L (ref 3.5–5.1)
Sodium: 139 mmol/L (ref 135–145)

## 2023-11-29 LAB — CBC
HCT: 28.4 % — ABNORMAL LOW (ref 39.0–52.0)
Hemoglobin: 8.5 g/dL — ABNORMAL LOW (ref 13.0–17.0)
MCH: 26.1 pg (ref 26.0–34.0)
MCHC: 29.9 g/dL — ABNORMAL LOW (ref 30.0–36.0)
MCV: 87.1 fL (ref 80.0–100.0)
Platelets: 372 10*3/uL (ref 150–400)
RBC: 3.26 MIL/uL — ABNORMAL LOW (ref 4.22–5.81)
RDW: 18.4 % — ABNORMAL HIGH (ref 11.5–15.5)
WBC: 11 10*3/uL — ABNORMAL HIGH (ref 4.0–10.5)
nRBC: 0 % (ref 0.0–0.2)

## 2023-11-29 LAB — CYCLIC CITRUL PEPTIDE ANTIBODY, IGG/IGA: CCP Antibodies IgG/IgA: 9 U (ref 0–19)

## 2023-11-29 LAB — MAGNESIUM: Magnesium: 2.5 mg/dL — ABNORMAL HIGH (ref 1.7–2.4)

## 2023-11-29 LAB — GLUCOSE, CAPILLARY: Glucose-Capillary: 203 mg/dL — ABNORMAL HIGH (ref 70–99)

## 2023-11-29 MED ORDER — METOPROLOL TARTRATE 25 MG PO TABS: 25.0000 mg | ORAL_TABLET | Freq: Two times a day (BID) | ORAL | 2 refills | Status: DC

## 2023-11-29 MED ORDER — ACETAMINOPHEN 325 MG PO TABS: 650.0000 mg | ORAL_TABLET | Freq: Four times a day (QID) | ORAL | Status: DC | PRN

## 2023-11-29 MED ORDER — POTASSIUM CHLORIDE 20 MEQ PO PACK
40.0000 meq | PACK | Freq: Once | ORAL | Status: AC
  Administered 2023-11-29: 40 meq via ORAL
  Filled 2023-11-29: qty 2

## 2023-11-29 MED ORDER — OXYCODONE HCL 5 MG PO TABS: 5.0000 mg | ORAL_TABLET | ORAL | 0 refills | Status: DC | PRN

## 2023-11-29 MED ORDER — DOCUSATE SODIUM 100 MG PO CAPS: 100.0000 mg | ORAL_CAPSULE | Freq: Two times a day (BID) | ORAL | Status: DC | PRN

## 2023-11-29 NOTE — Discharge Summary (Addendum)
 Physician Discharge Summary   Patient: [name] Young. MRN: [id number] DOB: 1941-01-21  Admit date:     11/20/2023  Discharge date: 11/29/2023  Discharge Physician: [name]   PCP: [name], MD   Recommendations at discharge:   Follow up with Pulmonology Follow up with Primary Care in 1 week Follow up with your other outpatient Specialists as previously scheduled Repeat CBC,. BMP, Mg at follow up Consider resuming Eliquis after repeat CBC, if clinically appropriate  Discharge Diagnoses: Principal Problem:   Hemothorax Active Problems:   Coronary artery disease   PAF (paroxysmal atrial fibrillation) (HCC)   Aortic valve stenosis   Chronic diastolic CHF (congestive heart failure) (HCC)   Hemothorax on right  Resolved Problems:   * No resolved hospital problems. *  Hospital Course:  [name] Young. is a 83 y.o. male  with medical history significant for chronic kidney disease stage III, BPH, HTN, DM2, paroxysmal atrial fibrillation (Eliquis), chronic diastolic heart failure with recurrent pleural effusions. He is familiar to IR from multiple thoracenteses with the last one on 11/16/23. 800 ml of yellow fluid was removed and post-procedure CXR was negative for a pneumothorax. Shortly after leaving the hospital he developed pleuritic right-sided chest pain that persisted.    He presented to the hospital on 11/20/2023 for right and left heart catheterization. Fluoroscopy of the right chest was performed before access was obtained and this showed a large right pleural effusion with near complete collapse of the right lung. The catheterization was cancelled and additional imaging obtained shows a hemothorax. His labs showed a 3 gm drop in hemoglobin.    He was admitted to the ICU for right hemothorax complicated by acute blood loss anemia.  Chest tube was placed in the right chest wall by interventional radiologist.  He was transfused 1 unit of PRBCs on 11/20/2023.    He was transferred from the ICU to [facility] hospitalist service on 11/22/2023.   Assessment and Plan:  Hemothorax due to previous thoracentesis exacerbated by the use of Eliquis   Right hemothorax with complete collapse of right lung, recent right thoracentesis on 11/16/2023: S/p right chest tube placement with about 1.7 L of bloody output on 11/20/2023 by IR.   Analgesics as needed for pain.  He has been getting intrapleural tPA. Treated with IV Zosyn for ?empyema.  No growth on pleural fluid cultures Chest x-ray on 11/25/2022 showed stable low volume chest.   No pneumothorax or increasing pleural fluid. Pulmonology removed chest tube this AM Completed antibiotics during admission  Follow up outpatient with pulmonology     Acute blood loss anemia: H&H is stable.  Hemoglobin 8.3.  Monitor H&H and transfuse as needed s/p transfusion 1 unit of PRBCs on 11/20/2023 for hemoglobin of 7.2.     AKI: Stable.  Repeat BMP at follow up. Creatinine slowly trending upward, 1.38-1.48. Creatinine was 1.71 on admission.      Acute hypoxic respiratory failure: Resolved  On room air.     Paroxysmal atrial fibrillation, nonsustained VT, frequent PVCs on telemetry:  Continue metoprolol.   Eliquis has been held -- will hold on d/c until follow up CBC as outpatient PCP or Cardiology to determine when to resume Eliquis     Chronic combined systolic/diastolic CHF, CAD, moderate aortic valve stenosis compensated. Continue Lasix.  Hold metolazone. 2D echo showed EF estimated at 40 to 45%, grade 1 diastolic dysfunction, mild LVH, mild MR, mild AR, mild to moderate AAS. 2D echo in April  2024 showed EF estimated at 50 to 55%.     General Weakness:  PT and OT recommended SNF at discharge.   Pt declines and will go home with Home Health.   Wife in agreement. Wheelchair ordered for home use     Constipation: Laxatives as needed       Consultants: Interventional radiology Cardiology Pulmonology    Procedures: Right chest tube placement on 11/20/2023       Consultants: as above  Procedures performed: as above  Disposition: Home health Diet recommendation:  Discharge Diet Orders (From admission, onward)     Start     Ordered   11/29/23 0000  Diet - low sodium heart healthy        11/29/23 0953            DISCHARGE MEDICATION: Allergies as of 11/29/2023   No Known Allergies      Medication List     PAUSE taking these medications    Eliquis 5 MG Tabs tablet Wait to take this until your doctor or other care provider tells you to start again. Generic drug: apixaban TAKE 1 TABLET BY MOUTH TWICE A DAY       STOP taking these medications    doxycycline 100 MG tablet Commonly known as: VIBRA-TABS   metolazone 2.5 MG tablet Commonly known as: ZAROXOLYN   metoprolol succinate 25 MG 24 hr tablet Commonly known as: TOPROL-XL       TAKE these medications    acetaminophen 325 MG tablet Commonly known as: TYLENOL Take 2 tablets (650 mg total) by mouth every 6 (six) hours as needed.   aspirin EC 81 MG tablet Take 81 mg by mouth daily. Swallow whole.   B-12 1000 MCG Caps Take 1 capsule by mouth every Monday, Wednesday, and Friday.   docusate sodium 100 MG capsule Commonly known as: COLACE Take 1 capsule (100 mg total) by mouth 2 (two) times daily as needed for mild constipation.   feeding supplement Liqd Take 237 mLs by mouth 2 (two) times daily between meals.   furosemide 40 MG tablet Commonly known as: LASIX Take 1 tablet (40 mg total) by mouth daily.   Magnesium 250 MG Tabs Take 250 mg by mouth daily.   metoprolol tartrate 25 MG tablet Commonly known as: LOPRESSOR Take 1 tablet (25 mg total) by mouth 2 (two) times daily.   mirtazapine 15 MG tablet Commonly known as: REMERON Take 1 tablet (15 mg total) by mouth at bedtime.   nitroGLYCERIN 0.4 MG SL tablet Commonly known as: Nitrostat Place 1 tablet (0.4 mg total) under the tongue every  5 (five) minutes as needed for chest pain.   oxyCODONE 5 MG immediate release tablet Commonly known as: Oxy IR/ROXICODONE Take 1 tablet (5 mg total) by mouth every 4 (four) hours as needed for moderate pain (pain score 4-6).   PRESERVISION AREDS 2 PO Take 1 capsule by mouth daily.   rosuvastatin 10 MG tablet Commonly known as: CRESTOR TAKE 1 TABLET BY MOUTH EVERY DAY   tamsulosin 0.4 MG Caps capsule Commonly known as: FLOMAX Take 1 capsule (0.4 mg total) by mouth daily.   True Metrix Blood Glucose Test test strip Generic drug: glucose blood Use as instructed to check blood sugar once a day   Vitamin D3 25 MCG (1000 UT) Caps Take 1 capsule (1,000 Units total) by mouth daily.               Durable Medical Equipment  (From  admission, onward)           Start     Ordered   11/29/23 1011  For home use only DME standard manual wheelchair with seat cushion  Once       Comments: Patient suffers from generalized weakness / physical debility which impairs their ability to perform daily activities like bathing, dressing, grooming, and toileting in the home.  A cane, crutch, or walker will not resolve issue with performing activities of daily living. A wheelchair will allow patient to safely perform daily activities. Patient can safely propel the wheelchair in the home or has a caregiver who can provide assistance. Length of need Lifetime. Accessories: elevating leg rests (ELRs), wheel locks, extensions and anti-tippers.   11/29/23 1011              Discharge Care Instructions  (From admission, onward)           Start     Ordered   11/29/23 0000  Discharge wound care:       Comments: Be sure to change positions at least every 2 hours --- you want to off-load pressure from the area of the wound/s so they can get air and heal.    Monitor the area for increasing redness, pus-like drainage or progression of the wound. Home health nurse can assist with wound care and  monitoring / assessments.   11/29/23 0953            Discharge Exam: Filed Weights   11/26/23 0500 11/28/23 0500 11/29/23 0500  Weight: 102 kg 103.2 kg 102.7 kg   General exam: awake, alert, no acute distress, chronically ill appearing Respiratory system: CTAB diminished bases, normal respiratory effort. Cardiovascular system: normal S1/S2,  RRR   Gastrointestinal system: soft, NT, ND, no HSM felt, +bowel sounds. Central nervous system: A&O x 3. no gross focal neurologic deficits, normal speech Skin: dry, intact, normal temperature Psychiatry: normal mood, congruent affect, judgement and insight appear normal   Condition at discharge: stable  The results of significant diagnostics from this hospitalization (including imaging, microbiology, ancillary and laboratory) are listed below for reference.   Imaging Studies: DG Chest Port 1 View Result Date: 11/29/2023 CLINICAL DATA:  Right pleural effusion. EXAM: PORTABLE CHEST 1 VIEW COMPARISON:  November 25, 2023. FINDINGS: Stable cardiomediastinal silhouette. Status post coronary bypass graft. Stable right-sided pleural drainage catheter. Minimal bibasilar atelectasis is noted with small pleural effusions. IMPRESSION: Stable right-sided pleural drainage catheter. Minimal bibasilar atelectasis with small pleural effusions. Electronically Signed   By: Lupita Raider M.D.   On: 11/29/2023 12:17   DG Chest Port 1 View Result Date: 11/25/2023 CLINICAL DATA:  Pleural effusion EXAM: PORTABLE CHEST 1 VIEW COMPARISON:  Yesterday FINDINGS: Chest tube at the right base in stable position. No increasing pleural fluid or visible pneumothorax. Lung volumes remain low with hazy density attributed atelectasis. No edema. Cardiomegaly with coronary stenting and CABG. Artifact from EKG leads. IMPRESSION: Stable low volume chest. No pneumothorax or increasing pleural fluid. Electronically Signed   By: Tiburcio Pea M.D.   On: 11/25/2023 08:30   DG Chest Port  1 View Result Date: 11/24/2023 CLINICAL DATA:  Pleural effusion. EXAM: PORTABLE CHEST 1 VIEW COMPARISON:  11/23/2023. FINDINGS: There is trace right pleural effusion, which appears decreased in size since the prior study. Right pleural drainage catheter and trace right pleural effusion are again seen. There is linear area of atelectasis/scarring at the left lung base, laterally. Bilateral lung fields are otherwise clear.  No acute consolidation or lung collapse. No pneumothorax on either side. Stable cardio-mediastinal silhouette. There are surgical staples along the heart border and sternotomy wires, status post CABG (coronary artery bypass graft). No acute osseous abnormalities. The soft tissues are within normal limits. IMPRESSION: Trace right pleural effusion, decreased since the prior study. Electronically Signed   By: Jules Schick M.D.   On: 11/24/2023 12:06   CT CHEST WO CONTRAST Result Date: 11/23/2023 CLINICAL DATA:  Pleural effusion chest tube EXAM: CT CHEST WITHOUT CONTRAST TECHNIQUE: Multidetector CT imaging of the chest was performed following the standard protocol without IV contrast. RADIATION DOSE REDUCTION: This exam was performed according to the departmental dose-optimization program which includes automated exposure control, adjustment of the mA and/or kV according to patient size and/or use of iterative reconstruction technique. COMPARISON:  Chest x-ray 11/23/2023, CT chest 11/20/2023, 05/16/2023 FINDINGS: Cardiovascular: Limited evaluation without intravenous contrast. Moderate aortic atherosclerosis. No aneurysm. Coronary vascular calcification. Borderline to mild cardiomegaly. No pericardial effusion. Post CABG changes. Mediastinum/Nodes: Patent trachea. No thyroid mass. No suspicious lymph nodes. Esophagus within normal limits. Lungs/Pleura: Mild subpleural reticulation. Bandlike densities in the right upper and lower lobes which may be due to atelectasis or pulmonary scarring. Interim  placement of right lateral percutaneous drainage catheter with pigtail at the right posterior lung base. Significant interval decrease in right-sided pleural effusion compared to the prior chest CT. Moderate air with fluid level in the lateral pleural space consistent with pneumothorax. Decreased right lung consolidations compared to prior consistent with improved atelectasis. Hyperdense masses along the pleural surface. Lobulated hyperdensity along the right middle lobe pleural surface measures 4.2 by 2 cm. Dominant hyperdense pleural density within the posterior right lower lobe measures about 12.3 x 3.9 cm, overall decreased in size compared with the prior chest CT. Upper Abdomen: Small volume perihepatic free fluid. Suspicion of small organized fluid collection along the right posterior liver margin, for example series 2, image 156, measuring about 5.5 x 3 cm Musculoskeletal: Sternotomy. Multilevel degenerative osteophytes. No acute osseous abnormality IMPRESSION: 1. Interim placement of right lateral percutaneous drainage catheter with pigtail at the right posterior lung base. Significant interval decrease in size of complex right-sided pleural effusion compared to the prior chest CT. Moderate air with fluid level in the lateral pleural space consistent with hydro/hematohydropneumothorax 2. Decreased right lung consolidations compared to prior consistent with improved atelectasis and overall better aeration of the right lung parenchyma. 3. Hyperdense masses along the right pleural surface as described above, overall decreased in size compared with the prior chest CT. Findings may be due to pleural hemorrhage/hematoma but pleural mass not excluded and continued CT follow-up is recommended to ensure resolution. 4. Small volume perihepatic free fluid. Suspicion of small organized fluid collection along the right posterior liver margin measuring about 5.5 x 3 cm. This is indeterminate for infected fluid collection  or organizing hematoma, further assessment limited without contrast 5. Aortic atherosclerosis. Aortic Atherosclerosis (ICD10-I70.0). Electronically Signed   By: Jasmine Pang M.D.   On: 11/23/2023 16:54   DG Chest Port 1 View Result Date: 11/23/2023 CLINICAL DATA:  Pleural effusion. EXAM: PORTABLE CHEST 1 VIEW COMPARISON:  November 22, 2023. FINDINGS: Stable cardiomediastinal silhouette. Sternotomy wires are noted. Stable right-sided pleural drainage catheter. No pneumothorax. Stable bibasilar atelectasis with small pleural effusions. IMPRESSION: Stable right-sided chest tube. Stable bilateral pleural effusions with associated subsegmental atelectasis. Electronically Signed   By: Lupita Raider M.D.   On: 11/23/2023 09:05   ECHOCARDIOGRAM COMPLETE Result Date: 11/22/2023  ECHOCARDIOGRAM REPORT   Patient Name:   [name] Young. Date of Exam: 11/22/2023 Medical Rec #:  [id number]            Height:       70.0 in Accession #:    [id number]           Weight:       224.2 lb Date of Birth:  [birth year]           BSA:          2.191 m Patient Age:    82 years             BP:           114/63 mmHg Patient Gender: M                    HR:           87 bpm. Exam Location:  [facility] Procedure: 2D Echo, Cardiac Doppler and Color Doppler (Both Spectral and Color            Flow Doppler were utilized during procedure). Indications:     CHF  History:         Patient has prior history of Echocardiogram examinations, most                  recent 12/30/2022. CHF, CAD, Prior CABG, Arrythmias:PVC, Atrial                  Fibrillation and Bradycardia, Signs/Symptoms:Bacteremia and                  Shortness of Breath; Risk Factors:Diabetes and Dyslipidemia.                  CKD, MV endocarditis.  Sonographer:     Mikki Harbor Referring Phys:  [id number] Antonieta Iba Diagnosing Phys: Julien Nordmann MD  Sonographer Comments: Technically difficult study due to poor echo windows and no subcostal window. Image acquisition challenging  due to respiratory motion. IMPRESSIONS  1. Left ventricular ejection fraction, by estimation, is 40 to 45%. Left ventricular ejection fraction by 2D MOD biplane is 53.7 %. The left ventricle has mildly decreased function. The left ventricle demonstrates global hypokinesis. There is mild left ventricular hypertrophy. Left ventricular diastolic parameters are consistent with Grade I diastolic dysfunction (impaired relaxation).  2. Right ventricular systolic function is normal. The right ventricular size is normal. There is normal pulmonary artery systolic pressure. The estimated right ventricular systolic pressure is 14.3 mmHg.  3. The mitral valve is normal in structure. Mild mitral valve regurgitation. No evidence of mitral stenosis.  4. The aortic valve is normal in structure. There is moderate calcification of the aortic valve. Aortic valve regurgitation is mild. Mild to moderate aortic valve stenosis. Aortic valve mean gradient measures 16.0 mmHg.  5. There is mild dilatation of the aortic root, measuring 43 mm.  6. The inferior vena cava is normal in size with greater than 50% respiratory variability, suggesting right atrial pressure of 3 mmHg. FINDINGS  Left Ventricle: Left ventricular ejection fraction, by estimation, is 40 to 45%. Left ventricular ejection fraction by 2D MOD biplane is 53.7 %. The left ventricle has mildly decreased function. The left ventricle demonstrates global hypokinesis. Strain  was performed and the global longitudinal strain is indeterminate. The left ventricular internal cavity size was normal in size. There is mild left ventricular hypertrophy. Left ventricular diastolic parameters are consistent with Grade I  diastolic dysfunction (impaired relaxation). Right Ventricle: The right ventricular size is normal. No increase in right ventricular wall thickness. Right ventricular systolic function is normal. There is normal pulmonary artery systolic pressure. The tricuspid regurgitant  velocity is 1.68 m/s, and  with an assumed right atrial pressure of 3 mmHg, the estimated right ventricular systolic pressure is 14.3 mmHg. Left Atrium: Left atrial size was normal in size. Right Atrium: Right atrial size was normal in size. Pericardium: There is no evidence of pericardial effusion. Mitral Valve: The mitral valve is normal in structure. There is mild calcification of the mitral valve leaflet(s). Mild to moderate mitral annular calcification. Mild mitral valve regurgitation. No evidence of mitral valve stenosis. MV peak gradient, 6.9  mmHg. The mean mitral valve gradient is 2.0 mmHg. Tricuspid Valve: The tricuspid valve is normal in structure. Tricuspid valve regurgitation is not demonstrated. No evidence of tricuspid stenosis. Aortic Valve: The aortic valve is normal in structure. There is moderate calcification of the aortic valve. Aortic valve regurgitation is mild. Aortic regurgitation PHT measures 1093 msec. Mild to moderate aortic stenosis is present. Aortic valve mean gradient measures 16.0 mmHg. Aortic valve peak gradient measures 30.1 mmHg. Aortic valve area, by VTI measures 1.67 cm. Pulmonic Valve: The pulmonic valve was normal in structure. Pulmonic valve regurgitation is not visualized. No evidence of pulmonic stenosis. Aorta: The aortic root is normal in size and structure. There is mild dilatation of the aortic root, measuring 43 mm. Venous: The inferior vena cava is normal in size with greater than 50% respiratory variability, suggesting right atrial pressure of 3 mmHg. IAS/Shunts: No atrial level shunt detected by color flow Doppler. Additional Comments: 3D was performed not requiring image post processing on an independent workstation and was indeterminate.  LEFT VENTRICLE PLAX 2D                        Biplane EF (MOD) LVIDd:         5.30 cm         LV Biplane EF:   Left LVIDs:         3.70 cm                          ventricular LV PW:         1.60 cm                           ejection LV IVS:        1.50 cm                          fraction by LVOT diam:     2.30 cm                          2D MOD LV SV:         89                               biplane is LV SV Index:   41                               53.7 %. LVOT Area:     4.15 cm  Diastology                                LV e' medial:    8.05 cm/s LV Volumes (MOD)               LV E/e' medial:  6.2 LV vol d, MOD    79.0 ml       LV e' lateral:   8.05 cm/s A2C:                           LV E/e' lateral: 6.2 LV vol d, MOD    107.0 ml A4C: LV vol s, MOD    35.1 ml A2C: LV vol s, MOD    53.3 ml A4C: LV SV MOD A2C:   43.9 ml LV SV MOD A4C:   107.0 ml LV SV MOD BP:    54.9 ml RIGHT VENTRICLE RV Basal diam:  3.60 cm RV Mid diam:    3.10 cm RV S prime:     14.35 cm/s LEFT ATRIUM             Index        RIGHT ATRIUM           Index LA diam:        3.60 cm 1.64 cm/m   RA Area:     19.40 cm LA Vol (A2C):   67.7 ml 30.90 ml/m  RA Volume:   48.30 ml  22.04 ml/m LA Vol (A4C):   42.8 ml 19.53 ml/m LA Biplane Vol: 58.4 ml 26.65 ml/m  AORTIC VALVE                     PULMONIC VALVE AV Area (Vmax):    1.71 cm      PV Vmax:       1.46 m/s AV Area (Vmean):   1.51 cm      PV Peak grad:  8.5 mmHg AV Area (VTI):     1.67 cm AV Vmax:           274.50 cm/s AV Vmean:          183.500 cm/s AV VTI:            0.531 m AV Peak Grad:      30.1 mmHg AV Mean Grad:      16.0 mmHg LVOT Vmax:         113.00 cm/s LVOT Vmean:        66.850 cm/s LVOT VTI:          0.214 m LVOT/AV VTI ratio: 0.40 AI PHT:            1093 msec  AORTA Ao Root diam: 4.30 cm Ao Asc diam:  3.70 cm MITRAL VALVE                TRICUSPID VALVE MV Area (PHT): 2.70 cm     TR Peak grad:   11.3 mmHg MV Area VTI:   3.43 cm     TR Vmax:        168.00 cm/s MV Peak grad:  6.9 mmHg MV Mean grad:  2.0 mmHg     SHUNTS MV Vmax:       1.31 m/s     Systemic VTI:  0.21 m MV Vmean:      66.1 cm/s    Systemic Diam:  2.30 cm MV Decel Time: 281 msec MV E velocity: 50.30 cm/s  MV A velocity: 112.00 cm/s MV E/A ratio:  0.45 Julien Nordmann MD Electronically signed by Julien Nordmann MD Signature Date/Time: 11/22/2023/1:19:27 PM    Final    DG Chest 1 View Result Date: 11/22/2023 CLINICAL DATA:  Chest tube in place. EXAM: CHEST  1 VIEW COMPARISON:  November 21, 2023. FINDINGS: Stable cardiomegaly. Sternotomy wires are noted. Right-sided chest tube is noted with grossly stable right pleural effusion and associated atelectasis. Minimal left basilar subsegmental atelectasis is noted. Bony thorax is unremarkable. IMPRESSION: Stable right-sided chest tube with grossly stable right pleural effusion and associated atelectasis. Electronically Signed   By: Lupita Raider M.D.   On: 11/22/2023 09:38   DG Chest Port 1 View Result Date: 11/21/2023 CLINICAL DATA:  Dyspnea EXAM: PORTABLE CHEST 1 VIEW COMPARISON:  11/21/2023 FINDINGS: Right basilar pigtail chest tube has migrated inferiorly. Small to moderate right pleural effusion persists with associated right basilar atelectasis. Small left pleural effusion is unchanged. Mild left basilar atelectasis. Coronary artery bypass grafting has been performed. Cardiac size is mildly enlarged, unchanged. No pneumothorax. IMPRESSION: 1. Interval inferior migration of the right basilar pigtail chest tube. Persistent small to moderate right pleural effusion. 2. Stable small left pleural effusion. Electronically Signed   By: Helyn Numbers M.D.   On: 11/21/2023 19:58   DG Chest Port 1 View Result Date: 11/21/2023 CLINICAL DATA:  [id number] Pleural effusion [id number] EXAM: PORTABLE CHEST 1 VIEW COMPARISON:  11/20/2023 FINDINGS: Right chest tube remains in place. Stable heart size status post sternotomy and CABG. Aortic atherosclerosis. Moderate right pleural effusion with a small amount of fissural fluid, stable. Similar hazy right basilar opacity. Left lung appears clear. No pneumothorax. IMPRESSION: Moderate right pleural effusion, stable. Right chest tube remains in  place. Electronically Signed   By: Duanne Guess D.O.   On: 11/21/2023 12:02   DG Chest Port 1 View Result Date: 11/20/2023 CLINICAL DATA:  Chest tube in place. EXAM: PORTABLE CHEST 1 VIEW COMPARISON:  Radiograph earlier today FINDINGS: Right pigtail catheter is coiled in the lateral mid hemithorax. Decreased right pleural effusion. Small pleural fluid persists with fluid tracking in the fissure. Mild ill-defined opacity in the right lung base. No visible pneumothorax. No focal left lung opacity. IMPRESSION: 1. Right pigtail catheter with decreased right pleural effusion. Small pleural fluid persists with fluid tracking in the fissure. 2. Mild ill-defined opacity in the right lung base, likely atelectasis. Electronically Signed   By: Narda Rutherford M.D.   On: 11/20/2023 22:42   ABORTED INVASIVE LAB PROCEDURE Addendum Date: 11/20/2023 [name] Young presented for right and left heart catheterization with possible PCI for evaluation of progressive shortness of breath in the setting of recurrent right pleural effusions.  He underwent therapeutic thoracentesis 4 days ago due to orthopnea.  On arrival today, he reported no significant improvement in his dyspnea following the procedure.  He was also found to be back in atrial fibrillation.  He was positioned on the catheterization table and fluoroscopy of the chest performed, revealing a large right pleural effusion with near complete collapse of the right lung (minimal effusion present on postthoracentesis chest radiograph 4 days ago).  Catheterization was canceled; venous and arterial access were never obtained.  Patient was transferred back to the holding area for further workup and management of large right pleural effusion, concerning for hemothorax given recent right thoracentesis and rapid accumulation of fluid.  Result Date: 11/20/2023 This case was  aborted.  IR PERC PLEURAL DRAIN W/INDWELL CATH W/IMG GUIDE Result Date: 11/20/2023 INDICATION:  83 year old male with a history anticoagulation and prior thoracentesis, presents with new onset right hemothorax referred for thoracostomy tube EXAM: IMAGE GUIDED PLACEMENT OF RIGHT-SIDED THORACOSTOMY TUBE MEDICATIONS: None. ANESTHESIA/SEDATION: Moderate (conscious) sedation was not employed during this procedure. A total of Versed 0 mg and Fentanyl 25 mcg was administered intravenously by the radiology nurse. Total intra-service moderate Sedation Time: 0 minutes. The patient's level of consciousness and vital signs were monitored continuously by radiology nursing throughout the procedure under my direct supervision. COMPLICATIONS: None PROCEDURE: The procedure, risks, benefits, and alternatives were explained to the patient/patient's family, who provided informed consent on the patient's behalf. Specific risks that were addressed included bleeding, infection, ongoing pneumothorax, need for further procedure/surgery, chance of hemorrhage, hemoptysis, cardiopulmonary collapse, death. Questions regarding the procedure were encouraged and answered. The patient understands and consents to the procedure. Patient was positioned in the right anterior oblique position on the IR table and scout image of the chest was performed for planning purposes. The right mid axillary line at the level of the nipple was identified, and prepped and draped in the usual sterile fashion. The skin and subcutaneous tissues were generously infiltrated 1% lidocaine for local anesthesia. A Yueh needle was then used to enter the pleural space with aspiration of hemothorax. The plastic Yueh catheter was advanced into the pleural space and an 035 guidewire was advanced under fluoroscopy. Dilation of the skin tract was performed over the wire, and then modified Seldinger technique was used to place a 16 French pigtail catheter Catheter was attached to water seal chamber and suction was applied confirming a operational chest tube. A proximally 1700  cc of hemothorax of evacuated. A new pleurevac chamber was attached 4:26pm. Retention suture was placed.  Sterile dressing was placed. Patient tolerated the procedure well and remained hemodynamically stable throughout. No complications were encountered and no significant blood loss was encounter IMPRESSION: Status post right-sided thoracostomy tube for hemothorax. Signed, Yvone Neu. Miachel Roux, RPVI [facility] Electronically Signed   By: Gilmer Mor D.O.   On: 11/20/2023 16:45   CT CHEST WO CONTRAST Result Date: 11/20/2023 CLINICAL DATA:  Shortness of breath.  Recent thoracentesis. EXAM: CT CHEST WITHOUT CONTRAST TECHNIQUE: Multidetector CT imaging of the chest was performed following the standard protocol without IV contrast. RADIATION DOSE REDUCTION: This exam was performed according to the departmental dose-optimization program which includes automated exposure control, adjustment of the mA and/or kV according to patient size and/or use of iterative reconstruction technique. COMPARISON:  Radiograph earlier today. Radiograph 11/16/2023. Most recent CT 05/16/2023 FINDINGS: Cardiovascular: The heart is mildly enlarged. Coronary artery calcifications post CABG. Calcifications of the aortic valve upper normal ascending aortic caliber 3.9 cm. Moderate aortic atherosclerosis. No pericardial effusion. Mediastinum/Nodes: No enlarged mediastinal lymph nodes. Hilar assessment is limited on this unenhanced exam. The esophagus is mildly patulous, no wall thickening. Lungs/Pleura: Large right pleural effusion is partially loculated. It is difficult to delineate compressed lung in the right lower lobe from complex fluid, however suspect heterogeneous increased density within the right pleural effusion dependently and laterally. Complete consolidation throughout the right lower lobe with subtotal consolidation in the dependent right upper and right middle  lobes. It is unclear if this represents compressive atelectasis or infection. Cannot exclude underlying neoplasm. Subsegmental linear atelectasis in the left upper lobe. The left lung is otherwise clear. There is no left pleural effusion. Retained  mucus within the trachea. Upper Abdomen: There is fluid posterior to the right lobe of the liver with area of decreased density in the posterior right lobe, series 3, image 158. This has increased from September CT. Musculoskeletal: Prior median sternotomy. Degenerative change throughout the thoracic spine. No acute osseous findings or bony destructive change. IMPRESSION: 1. Large right pleural effusion, partially loculated. It is difficult to delineate compressed lung in the right lower lobe from complex fluid, however suspect heterogeneous increased density within the right pleural effusion dependently and laterally. This may represent blood products or infection. Consider fluid sampling or contrast-enhanced exam for more more detailed assessment 2. Complete consolidation throughout the right lower lobe with subtotal consolidation in the dependent right upper and right middle lobes. It is unclear if this represents compressive atelectasis or infection. Cannot exclude underlying neoplasm. 3. Fluid posterior to the right lobe of the liver with area of decreased density in the posterior right lobe. This has increased from September CT. Etiology is indeterminate. Abdominal CT (with IV contrast in the absence of contraindication) would be helpful for more detailed assessment. 4. Retained mucus within the trachea. Aortic Atherosclerosis (ICD10-I70.0). Electronically Signed   By: Narda Rutherford M.D.   On: 11/20/2023 15:25   DG Chest Port 1 View Result Date: 11/20/2023 CLINICAL DATA:  Shortness of breath. EXAM: PORTABLE CHEST 1 VIEW COMPARISON:  11/16/2023 FINDINGS: Progressive right pleural effusion, no large, and partially loculated. Fluid tracks in the fissure. There is  associated opacity throughout the right hemithorax which is indeterminate for atelectasis or airspace disease. Prior median sternotomy and CABG. Grossly stable heart size and mediastinal contours. No left lung opacity or left pleural effusion. No pneumothorax. IMPRESSION: Progressive right pleural effusion, now large and partially loculated. Associated opacity throughout the right hemithorax is indeterminate for atelectasis or airspace disease. Electronically Signed   By: Narda Rutherford M.D.   On: 11/20/2023 15:15   DG Chest Port 1 View Result Date: 11/16/2023 CLINICAL DATA:  Status post right thoracentesis. EXAM: PORTABLE CHEST 1 VIEW COMPARISON:  November 05, 2023. FINDINGS: Stable cardiomediastinal silhouette. Status post coronary bypass graft. Left lung is clear. No pneumothorax is noted status post right thoracentesis. Small right pleural effusion is noted with associated atelectasis. Bony thorax is unremarkable. IMPRESSION: No pneumothorax status post right thoracentesis. Small right pleural effusion with associated atelectasis. Electronically Signed   By: Lupita Raider M.D.   On: 11/16/2023 13:53   US THORACENTESIS ASP PLEURAL SPACE W/IMG GUIDE Result Date: 11/16/2023 INDICATION: Patient is a 83 y/o male with history of CKD, CABG x 3, and CHF. Patient presents for a diagnostic and therapeutic thoracentesis due to a recurrent right pleural effusion. EXAM: ULTRASOUND GUIDED RIGHT THORACENTESIS MEDICATIONS: 8mL lidocaine 1 % COMPLICATIONS: None immediate. PROCEDURE: An ultrasound guided thoracentesis was thoroughly discussed with the patient and questions answered. The benefits, risks, alternatives and complications were also discussed. The patient understands and wishes to proceed with the procedure. Written consent was obtained. Ultrasound was performed to localize and mark an adequate pocket of fluid in the right chest. The area was then prepped and draped in the normal sterile fashion. 1% Lidocaine  was used for local anesthesia. Under ultrasound guidance a 6 Fr Safe-T-Centesis catheter was introduced. Thoracentesis was performed. The catheter was removed and a dressing applied. FINDINGS: A total of approximately 800 mL of yellow fluid was removed. Samples were sent to the laboratory as requested by the clinical team. IMPRESSION: Successful ultrasound guided right thoracentesis yielding 800 mL  of pleural fluid. Performed by Philipp Ovens PA-C Electronically Signed   By: Malachy Moan M.D.   On: 11/16/2023 12:01   DG Chest 2 View Result Date: 11/12/2023 CLINICAL DATA:  83 year old male with pleural effusion EXAM: CHEST - 2 VIEW COMPARISON:  10/26/2023 FINDINGS: Cardiomediastinal silhouette unchanged in size and contour. Surgical changes of median sternotomy and CABG. New opacity at the right lung base with obscuration of the right heart border, and right hemidiaphragm. Meniscus on the lateral view. Left lung relatively well aerated. No interlobular septal thickening. Degenerative changes spine.  No displaced fracture IMPRESSION: Recurrent right-sided pleural effusion with associated atelectasis/consolidation Electronically Signed   By: Gilmer Mor D.O.   On: 11/12/2023 14:48    Microbiology: Results for orders placed or performed during the hospital encounter of 11/20/23  Body fluid culture w Gram Stain     Status: None   Collection Time: 11/23/23  9:10 AM   Specimen: Pleura; Body Fluid  Result Value Ref Range Status   Specimen Description   Final    PLEURAL Performed at [facility], [address] [place], [place] [address]    Special Requests   Final    NONE Performed at [facility], [address] [place], [place] [address]    Gram Stain   Final    FEW WBC PRESENT, PREDOMINANTLY PMN NO ORGANISMS SEEN    Culture   Final    NO GROWTH 3 DAYS Performed at [facility], [address] [place], [place] [address]    Report Status 11/27/2023 FINAL   Final  MRSA Next Gen by PCR, Nasal     Status: None   Collection Time: 11/23/23  2:18 PM   Specimen: Nasal Mucosa; Nasal Swab  Result Value Ref Range Status   MRSA by PCR Next Gen NOT DETECTED NOT DETECTED Final    Comment: (NOTE) The GeneXpert MRSA Assay (FDA approved for NASAL specimens only), is one component of a comprehensive MRSA colonization surveillance program. It is not intended to diagnose MRSA infection nor to guide or monitor treatment for MRSA infections. Test performance is not FDA approved in patients less than 15 years old. Performed at [facility], [address] [place], [place] [address]   Expectorated Sputum Assessment w Gram Stain, Rflx to Resp Cult     Status: None   Collection Time: 11/23/23  6:17 PM   Specimen: Sputum  Result Value Ref Range Status   Specimen Description SPU EXPECTORATED SPUTUM  Final   Special Requests NONE  Final   Sputum evaluation   Final    THIS SPECIMEN IS ACCEPTABLE FOR SPUTUM CULTURE Performed at [facility], [address], [place], [place] [address]    Report Status 11/23/2023 FINAL  Final  Culture, Respiratory w Gram Stain     Status: None   Collection Time: 11/23/23  6:17 PM   Specimen: Sputum  Result Value Ref Range Status   Specimen Description   Final    SPU EXPECTORATED SPUTUM Performed at [facility], [address] [place], [place] [address]    Special Requests   Final    NONE Reflexed from [id number] Performed at [facility], [address] [place], [place] [address]    Gram Stain   Final    WBC PRESENT, PREDOMINANTLY PMN RARE GRAM POSITIVE COCCI    Culture   Final    RARE Normal respiratory flora-no Staph aureus or Pseudomonas seen Performed at [facility], [address], [place], [place] [address]    Report Status 11/26/2023 FINAL  Final    Labs: CBC: Recent Labs  Lab 11/23/23 0406 11/23/23 0407 11/24/23 0404 11/25/23 0443 11/26/23 0657  11/28/23 0514 11/29/23 0547  WBC 14.5*   < > 13.0* 11.8* 11.6* 10.6* 11.0*  NEUTROABS 11.2*  --   --   --   --   --   --   HGB 9.4*   < > 8.7* 8.1* 8.3* 8.2* 8.5*  HCT 30.8*   < > 28.4* 26.1* 27.1* 27.2* 28.4*  MCV 87.3   < > 86.3 85.6 86.6 85.8 87.1  PLT 451*   < > 398 391 374 373 372   < > = values in this interval not displayed.   Basic Metabolic Panel: Recent Labs  Lab 11/23/23 0407 11/24/23 0404 11/25/23 0443 11/26/23 0657 11/28/23 0514 11/29/23 0547  NA 135 133* 135 137 136 139  K 4.8 4.2 3.8 3.4* 3.1* 3.9  CL 94* 93* 93* 94* 97* 94*  CO2 29 31 30 30 31  33*  GLUCOSE 152* 141* 128* 138* 114* 173*  BUN 62* 60* 61* 50* 54* 56*  CREATININE 1.29* 1.35* 1.38* 1.48* 1.52* 1.32*  CALCIUM 8.4* 7.6* 7.6* 7.8* 7.5* 8.0*  MG 3.0* 3.0* 3.0* 2.7*  --  2.5*  PHOS  --   --  4.5 3.9  --   --    Liver Function Tests: Recent Labs  Lab 11/25/23 0443  ALBUMIN 1.9*   CBG: Recent Labs  Lab 11/28/23 0801 11/28/23 1208 11/28/23 1607 11/28/23 2052 11/29/23 0935  GLUCAP 124* 199* 136* 150* 203*    Discharge time spent: greater than 30 minutes.  Signed: [name], DO [facility] 11/29/2023

## 2023-11-29 NOTE — Plan of Care (Signed)
  Problem: Activity: Goal: Ability to return to baseline activity level will improve Outcome: Progressing   Problem: Cardiovascular: Goal: Ability to achieve and maintain adequate cardiovascular perfusion will improve Outcome: Progressing   Problem: Health Behavior/Discharge Planning: Goal: Ability to safely manage health-related needs after discharge will improve Outcome: Progressing   

## 2023-11-29 NOTE — Progress Notes (Signed)
 NAME:  [name] Young., MRN:  [id number], DOB:  [birth year], LOS: 9 ADMISSION DATE:  11/20/2023  History of Present Illness:  [name] Young is a pleasant 83 year old male patient with a past medical history of recurrent pleural effusion status post multiple thoracentesis thought to be transudate presenting to [facility] for an outpatient left heart cath and right heart cath.  He was found to have a complete whiteout of the right hemithorax suspicious for a hemothorax.  Patient underwent thoracentesis on 03/07 and drained 800 cc of serous fluid.  And since then he has been complaining of sharp right-sided chest pain associated with worsening shortness of breath.  He presents today for a left heart cath and was noted on fluoroscopy to have a near complete whiteout of the right hemithorax.  Therefore left heart cath was canceled.  Labs were noticeable for drop in hemoglobin from 10 g/dL at baseline to 7.1 g/dL at baseline.  A CT scan without contrast of the chest was obtained which showed a large right pleural effusion with heterogeneous densities highly suspicious for a hemothorax.  IR graciously placed a right 42 [place] skater with 1.8 L of serosanguineous fluid drained.  He remained hemodynamically throughout procedure.   11/27/23- patient had 70cc output from chest tube during day shift today.  IR had evaluated him and we discussed his pleural output.  Id like to hold removal of chest tube until it is less then 50cc/24h.  We will hold off from removal of pleural catheter today. Will continue to follow patient.  11/28/23- patient had appx 50cc serosanguinous output from chest tube today.  I met with wife [name] and we reviewed his hospital course.  He has had thoracentesis multiple times over past 6 months.  Etiology appears to be Global hypokinesis of LVEV with concomitant CKD. We will plan to clamp tube today and image chest in am for evaluation of pleural fluid. I flushed the tube today to make sure  its patent.   CXR in am.  Chest tube clamped today for evaluation of pleural fluid prior to removal ov chest tube.   11/29/23- patient had chest tube with stopcock in off position to pleural space overnight.  Repeat CXR this am with no re-accumulation and residual trace fluid.  I evaluated chest tube it is patent and was able to flush in and out easily. There was no additional fluid able to be withdrawn with 10ml syringe at bedside.  Chest tube removed today and patient cleared for dc from pulm perspective with outpatient follow up.  Overnight he reports episode of diarreah which he shares is unusual.  He was able to eat breakfast without nausea or abd discomfort.   Pertinent  Medical History  As above  Significant Hospital Events: Including procedures, antibiotic start and stop dates in addition to other pertinent events   11/20/2023 Admitted to the ICU s/p right 16 Fr Skater. 1.8L Serosanguinous fluid drained.  11/21/23: Tpa 10mg  Given intrapleural, output was 1800cc yesterday of sanguinous material. CXR appeared improved.  11/25/2023 Chest tube remains with significant output now at 800cc per 24hrs.     Objective   Blood pressure (!) 131/58, pulse 92, temperature 98.2 F (36.8 C), resp. rate 18, height 5\' 10"  (1.778 m), weight 102.7 kg, SpO2 95%.        Intake/Output Summary (Last 24 hours) at 11/29/2023 0823 Last data filed at 11/29/2023 0451 Gross per 24 hour  Intake 840 ml  Output 330 ml  Net 510 ml  Filed Weights   11/26/23 0500 11/28/23 0500 11/29/23 0500  Weight: 102 kg 103.2 kg 102.7 kg    Examination: General: Alert awake  and oriented. HD stable.  HENT: Supple neck reactive pupils Lungs: Improved breath sounds over the left hemithorax.  Cardiovascular: Normal S1, Normal S2, RRR, no murmurs or ES appreciated Abdomen: Soft, non tender, non distended, +BS  Extremities: Warm and well perfused.   Labs and imaging were reviewed.   Assessment & Plan:  [name] Young is a  pleasant 83 year old male patient with a past medical history of recurrent pleural effusion status post multiple thoracentesis thought to be transudate presenting to [facility] for an outpatient left heart cath and right heart cath.  He was found to have a complete whiteout of the right hemithorax suspicious for a hemothorax.  #Acute hypoxic respiratory failure secondary to...  #Large right hemothorax in the setting of recent thoracentesis on 11/16/2023 while being on Eliquis. S/p 16Fr Skater placement on 03/11. Appears.  #AKI lprerenal in the setting acute anemia  #Acute Anemia -from hemothorax #Paroxysmal A.fib on eliquis.   Improved overall however remain with significant drainage of 300cc. CXR this am with improved aeration in the right lower lobe.  []  CT to suction at -20cmH2O>>WATERSEAL -had no output from chest tube overnight []  Should he show any worsening output, hemodynamic instability or drop in hemogloibin, CTA chest should be obtained and IR should be contacted for possible embolization. If that is not feasible than transfer to [facility] after stabilization for thoracic surgery eval should be pursued.  []  Restart home diuretics [ fluid is exudative BECAUSE IT IS DIURESED BLOOD, HOWEVER GLUCOSE IS NORMAL AND GRAM STAIN IS NEGATIVE SO THIS IS REASSURING.  Etiology is CHF and CKD []  Daily CBC  []  Fluid for culutre and cell count no growth todate []  Culture negative so far. Procal flat  -patient had chest tube removed and is cleared to go home from pulmonary perspective  Pulmonary will follow closely.  Please do not hesitate to reach out with any concerns.   I spent 41  minutes caring for this patient today, including preparing to see the patient, obtaining a medical history , reviewing a separately obtained history, performing a medically appropriate examination and/or evaluation, counseling and educating the patient/family/caregiver, ordering medications, tests, or procedures, documenting  clinical information in the electronic health record, and independently interpreting results (not separately reported/billed) and communicating results to the patient/family/caregiver  Vida Rigger, MD 11/29/2023 8:23 AM

## 2023-11-29 NOTE — Plan of Care (Signed)
  Problem: Education: Goal: Understanding of CV disease, CV risk reduction, and recovery process will improve Outcome: Adequate for Discharge Goal: Individualized Educational Video(s) Outcome: Adequate for Discharge   Problem: Activity: Goal: Ability to return to baseline activity level will improve Outcome: Adequate for Discharge   Problem: Cardiovascular: Goal: Ability to achieve and maintain adequate cardiovascular perfusion will improve Outcome: Adequate for Discharge Goal: Vascular access site(s) Level 0-1 will be maintained Outcome: Adequate for Discharge   Problem: Health Behavior/Discharge Planning: Goal: Ability to safely manage health-related needs after discharge will improve Outcome: Adequate for Discharge   Problem: Education: Goal: Knowledge of General Education information will improve Description: Including pain rating scale, medication(s)/side effects and non-pharmacologic comfort measures Outcome: Adequate for Discharge   Problem: Health Behavior/Discharge Planning: Goal: Ability to manage health-related needs will improve Outcome: Adequate for Discharge   Problem: Clinical Measurements: Goal: Ability to maintain clinical measurements within normal limits will improve Outcome: Adequate for Discharge Goal: Will remain free from infection Outcome: Adequate for Discharge Goal: Diagnostic test results will improve Outcome: Adequate for Discharge Goal: Respiratory complications will improve Outcome: Adequate for Discharge Goal: Cardiovascular complication will be avoided Outcome: Adequate for Discharge   Problem: Activity: Goal: Risk for activity intolerance will decrease Outcome: Adequate for Discharge   Problem: Nutrition: Goal: Adequate nutrition will be maintained Outcome: Adequate for Discharge   Problem: Coping: Goal: Level of anxiety will decrease Outcome: Adequate for Discharge   Problem: Elimination: Goal: Will not experience complications  related to bowel motility Outcome: Adequate for Discharge Goal: Will not experience complications related to urinary retention Outcome: Adequate for Discharge   Problem: Pain Managment: Goal: General experience of comfort will improve and/or be controlled Outcome: Adequate for Discharge   Problem: Safety: Goal: Ability to remain free from injury will improve Outcome: Adequate for Discharge   Problem: Skin Integrity: Goal: Risk for impaired skin integrity will decrease Outcome: Adequate for Discharge   Problem: Education: Goal: Ability to describe self-care measures that may prevent or decrease complications (Diabetes Survival Skills Education) will improve Outcome: Adequate for Discharge Goal: Individualized Educational Video(s) Outcome: Adequate for Discharge   Problem: Coping: Goal: Ability to adjust to condition or change in health will improve Outcome: Adequate for Discharge   Problem: Fluid Volume: Goal: Ability to maintain a balanced intake and output will improve Outcome: Adequate for Discharge   Problem: Health Behavior/Discharge Planning: Goal: Ability to identify and utilize available resources and services will improve Outcome: Adequate for Discharge Goal: Ability to manage health-related needs will improve Outcome: Adequate for Discharge   Problem: Metabolic: Goal: Ability to maintain appropriate glucose levels will improve Outcome: Adequate for Discharge   Problem: Nutritional: Goal: Maintenance of adequate nutrition will improve Outcome: Adequate for Discharge Goal: Progress toward achieving an optimal weight will improve Outcome: Adequate for Discharge   Problem: Skin Integrity: Goal: Risk for impaired skin integrity will decrease Outcome: Adequate for Discharge   Problem: Tissue Perfusion: Goal: Adequacy of tissue perfusion will improve Outcome: Adequate for Discharge

## 2023-11-29 NOTE — Plan of Care (Signed)
  Problem: Education: Goal: Understanding of CV disease, CV risk reduction, and recovery process will improve Outcome: Progressing Goal: Individualized Educational Video(s) Outcome: Progressing   Problem: Activity: Goal: Ability to return to baseline activity level will improve Outcome: Progressing   Problem: Cardiovascular: Goal: Ability to achieve and maintain adequate cardiovascular perfusion will improve Outcome: Progressing Goal: Vascular access site(s) Level 0-1 will be maintained Outcome: Progressing   Problem: Health Behavior/Discharge Planning: Goal: Ability to safely manage health-related needs after discharge will improve Outcome: Progressing   Problem: Education: Goal: Knowledge of General Education information will improve Description: Including pain rating scale, medication(s)/side effects and non-pharmacologic comfort measures Outcome: Progressing   Problem: Health Behavior/Discharge Planning: Goal: Ability to manage health-related needs will improve Outcome: Progressing   Problem: Clinical Measurements: Goal: Ability to maintain clinical measurements within normal limits will improve Outcome: Progressing Goal: Will remain free from infection Outcome: Progressing Goal: Diagnostic test results will improve Outcome: Progressing Goal: Respiratory complications will improve Outcome: Progressing Goal: Cardiovascular complication will be avoided Outcome: Progressing   Problem: Activity: Goal: Risk for activity intolerance will decrease Outcome: Progressing   Problem: Nutrition: Goal: Adequate nutrition will be maintained Outcome: Progressing   Problem: Coping: Goal: Level of anxiety will decrease Outcome: Progressing   Problem: Elimination: Goal: Will not experience complications related to bowel motility Outcome: Progressing Goal: Will not experience complications related to urinary retention Outcome: Progressing   Problem: Pain Managment: Goal:  General experience of comfort will improve and/or be controlled Outcome: Progressing   Problem: Safety: Goal: Ability to remain free from injury will improve Outcome: Progressing   Problem: Skin Integrity: Goal: Risk for impaired skin integrity will decrease Outcome: Progressing   Problem: Education: Goal: Ability to describe self-care measures that may prevent or decrease complications (Diabetes Survival Skills Education) will improve Outcome: Progressing Goal: Individualized Educational Video(s) Outcome: Progressing   Problem: Coping: Goal: Ability to adjust to condition or change in health will improve Outcome: Progressing   Problem: Fluid Volume: Goal: Ability to maintain a balanced intake and output will improve Outcome: Progressing   Problem: Health Behavior/Discharge Planning: Goal: Ability to identify and utilize available resources and services will improve Outcome: Progressing Goal: Ability to manage health-related needs will improve Outcome: Progressing   Problem: Metabolic: Goal: Ability to maintain appropriate glucose levels will improve Outcome: Progressing   Problem: Nutritional: Goal: Maintenance of adequate nutrition will improve Outcome: Progressing Goal: Progress toward achieving an optimal weight will improve Outcome: Progressing   Problem: Skin Integrity: Goal: Risk for impaired skin integrity will decrease Outcome: Progressing   Problem: Tissue Perfusion: Goal: Adequacy of tissue perfusion will improve Outcome: Progressing

## 2023-11-29 NOTE — TOC Transition Note (Signed)
 Transition of Care [facility]) - Discharge Note   Patient Details  Name: [name] Young. MRN: [id number] Date of Birth: 01-11-1941  Transition of Care [facility]) CM/SW Contact:  Truddie Hidden, RN Phone Number: 11/29/2023, 10:21 AM   Clinical Narrative:  Kandee Keen from [place] notified of patient's discharge today.     Spoke with patient's wife regarding discharge today. She will transport patient home. She has been advised  WC has been requested. She is requesting the WC be delivered at home. Per Cletis Athens at [employer] the [facility] can be delivered at home but delivery may not be today. Patient has the option to pick the WC up at the [employer] store. Patient's wife stated her preference to have to [facility] delivered to the home. :"He ain't going no where today." Jon notified to have WC delivered to home and additionally that the patient's wife is aware delivery may not occur today.  Patient wife notified Frances Furbish will call to scheduled the [facility] within 48 hours.   TOC signing off.       Barriers to Discharge: Continued Medical Work up   Patient Goals and CMS Choice   CMS Medicare.gov Compare Post Acute Care list provided to:: Patient Choice offered to / list presented to : Spouse, Patient      Discharge Placement                       Discharge Plan and Services Additional resources added to the After Visit Summary for                                       Social Drivers of Health (SDOH) Interventions SDOH Screenings   Food Insecurity: No Food Insecurity (11/27/2023)  Housing: Low Risk  (11/27/2023)  Transportation Needs: No Transportation Needs (11/27/2023)  Utilities: Not At Risk (11/20/2023)  Depression (PHQ2-9): Low Risk  (10/08/2023)  Financial Resource Strain: Low Risk  (11/27/2023)  Physical Activity: Unknown (10/08/2023)  Social Connections: Moderately Isolated (11/20/2023)  Stress: Patient Declined (10/08/2023)  Tobacco Use: Medium Risk (11/27/2023)     Readmission Risk  Interventions     No data to display

## 2023-11-30 ENCOUNTER — Telehealth: Payer: Self-pay

## 2023-11-30 NOTE — Transitions of Care (Post Inpatient/ED Visit) (Signed)
 11/30/2023  Name: [name] Young. MRN: [id number] DOB: Apr 05, 1941  Today's TOC FU Call Status: Today's TOC FU Call Status:: Successful TOC FU Call Completed TOC FU Call Complete Date: 11/30/23 Patient's Name and Date of Birth confirmed.  Transition Care Management Follow-up Telephone Call Date of Discharge: 11/29/23 Discharge Facility: [facility]) Type of Discharge: Inpatient Admission Primary Inpatient Discharge Diagnosis:: Chronic Pleural Effusions How have you been since you were released from the hospital?: Better Any questions or concerns?: No  Items Reviewed: Did you receive and understand the discharge instructions provided?: Yes Medications obtained,verified, and reconciled?: Yes (Medications Reviewed) Any new allergies since your discharge?: No Dietary orders reviewed?: Yes Type of Diet Ordered:: Low Sodium Heart Healthy Do you have support at home?: Yes People in Home: spouse Name of Support/Comfort Primary Source: [name] Young  Medications Reviewed Today: Medications Reviewed Today     Reviewed by Redge Gainer, RN (Case Manager) on 11/30/23 at 1127  Med List Status: <None>   Medication Order Taking? Sig Documenting Provider Last Dose Status Informant  acetaminophen (TYLENOL) 325 MG tablet [id number]  Take 2 tablets (650 mg total) by mouth every 6 (six) hours as needed. Esaw Grandchild A, DO  Active   aspirin EC 81 MG tablet [id number]  Take 81 mg by mouth daily. Swallow whole. [provider]  Active   Cholecalciferol (VITAMIN D3) 25 MCG (1000 UT) CAPS [id number]  Take 1 capsule (1,000 Units total) by mouth daily. [name], MD  Active Spouse/Significant Other  Cyanocobalamin (B-12) 1000 MCG CAPS [id number]  Take 1 capsule by mouth every Monday, Wednesday, and Friday. [name], MD  Active Spouse/Significant Other  docusate sodium (COLACE) 100 MG capsule [id number]  Take 1 capsule (100 mg total) by mouth 2 (two)  times daily as needed for mild constipation. [name] Banter, DO  Active   ELIQUIS 5 MG TABS tablet [id number]  TAKE 1 TABLET BY MOUTH TWICE A DAY End, [name], MD  Active Spouse/Significant Other  feeding supplement (ENSURE ENLIVE / ENSURE PLUS) LIQD [id number]  Take 237 mLs by mouth 2 (two) times daily between meals. [name] Highland, MD  Active Spouse/Significant Other  furosemide (LASIX) 40 MG tablet [id number]  Take 1 tablet (40 mg total) by mouth daily. End, [name] Deer, MD  Active Spouse/Significant Other  glucose blood (TRUE METRIX BLOOD GLUCOSE TEST) test strip [id number]  Use as instructed to check blood sugar once a day [name], MD  Active Spouse/Significant Other  Magnesium 250 MG TABS [id number]  Take 250 mg by mouth daily. [provider]  Active Spouse/Significant Other  metoprolol tartrate (LOPRESSOR) 25 MG tablet [id number]  Take 1 tablet (25 mg total) by mouth 2 (two) times daily. Esaw Grandchild A, DO  Active   mirtazapine (REMERON) 15 MG tablet [id number]  Take 1 tablet (15 mg total) by mouth at bedtime. [name], MD  Active Spouse/Significant Other  Multiple Vitamins-Minerals (PRESERVISION AREDS 2 PO) [id number]  Take 1 capsule by mouth daily. [provider]  Active Spouse/Significant Other  nitroGLYCERIN (NITROSTAT) 0.4 MG SL tablet [id number]  Place 1 tablet (0.4 mg total) under the tongue every 5 (five) minutes as needed for chest pain. End, [name] Deer, MD  Active Spouse/Significant Other           Med Note [facility], BRANDY L   Wed Dec 21, 2021 10:47 AM)    oxyCODONE (OXY IR/ROXICODONE) 5 MG immediate release tablet [id number] No Take 1 tablet (5 mg total) by mouth every  4 (four) hours as needed for moderate pain (pain score 4-6).  Patient not taking: Reported on 11/30/2023   [name] Banter, DO Not Taking Active   rosuvastatin (CRESTOR) 10 MG tablet [id number]  TAKE 1 TABLET BY MOUTH EVERY DAY End, [name] Deer, MD  Active  Spouse/Significant Other  tamsulosin (FLOMAX) 0.4 MG CAPS capsule [id number]  Take 1 capsule (0.4 mg total) by mouth daily. [name], MD  Active Spouse/Significant Other            Home Care and Equipment/Supplies: Were Home Health Services Ordered?: Yes Name of Home Health Agency:: [facility] Has Agency set up a time to come to your home?: No (Waiting on the phone call for start of care) EMR reviewed for Home Health Orders: Orders present/patient has not received call (refer to CM for follow-up) Any new equipment or medical supplies ordered?: No  Functional Questionnaire: Do you need assistance with bathing/showering or dressing?: No Do you need assistance with meal preparation?: Yes (The spouse provides meal preparation) Do you need assistance with eating?: No Do you have difficulty maintaining continence: No Do you need assistance with getting out of bed/getting out of a chair/moving?: No Do you have difficulty managing or taking your medications?: No  Follow up appointments reviewed: PCP Follow-up appointment confirmed?: Yes Date of PCP follow-up appointment?: 12/10/23 Follow-up Provider: Dr. [name] Specialist [facility] Follow-up appointment confirmed?: Yes Date of Specialist follow-up appointment?: 12/06/23 Follow-Up Specialty Provider:: Dr. [name] Do you need transportation to your follow-up appointment?: No Do you understand care options if your condition(s) worsen?: Yes-patient verbalized understanding  SDOH Interventions Today    Flowsheet Row Most Recent Value  SDOH Interventions   Food Insecurity Interventions Intervention Not Indicated  Housing Interventions Intervention Not Indicated  Transportation Interventions Intervention Not Indicated  Utilities Interventions Intervention Not Indicated      Interventions Today    Flowsheet Row Most Recent Value  Chronic Disease   Chronic disease during today's visit Atrial Fibrillation (AFib)  General  Interventions   General Interventions Discussed/Reviewed General Interventions Discussed, General Interventions Reviewed, Doctor Visits  Doctor Visits Discussed/Reviewed Doctor Visits Reviewed  Exercise Interventions   Exercise Discussed/Reviewed Physical Activity  Physical Activity Discussed/Reviewed Physical Activity Reviewed  Education Interventions   Education Provided Provided Education  Provided Verbal Education On Medication  Pharmacy Interventions   Pharmacy Dicussed/Reviewed Medications and their functions        Deidre Ala, BSN, RN [place]  VBCI - Population Health RN Care Manager [phone]

## 2023-11-30 NOTE — Telephone Encounter (Signed)
 Copied from CRM [id number]. Topic: General - Other >> Nov 30, 2023  3:12 PM Aletta Edouard wrote: Reason for CRM: patient would like to start ot next week march 24 per wife request >> Nov 30, 2023  3:13 PM Rodman Pickle T wrote: The physical manger made the call to request patient start pt next week per wife request

## 2023-11-30 NOTE — Telephone Encounter (Signed)
 Agree with this. Thanks.

## 2023-12-03 DIAGNOSIS — N183 Chronic kidney disease, stage 3 unspecified: Secondary | ICD-10-CM | POA: Diagnosis not present

## 2023-12-03 DIAGNOSIS — N179 Acute kidney failure, unspecified: Secondary | ICD-10-CM | POA: Diagnosis not present

## 2023-12-03 DIAGNOSIS — J9601 Acute respiratory failure with hypoxia: Secondary | ICD-10-CM | POA: Diagnosis not present

## 2023-12-03 DIAGNOSIS — E1122 Type 2 diabetes mellitus with diabetic chronic kidney disease: Secondary | ICD-10-CM | POA: Diagnosis not present

## 2023-12-03 DIAGNOSIS — J95831 Postprocedural hemorrhage and hematoma of a respiratory system organ or structure following other procedure: Secondary | ICD-10-CM | POA: Diagnosis not present

## 2023-12-03 DIAGNOSIS — I480 Paroxysmal atrial fibrillation: Secondary | ICD-10-CM | POA: Diagnosis not present

## 2023-12-03 DIAGNOSIS — I130 Hypertensive heart and chronic kidney disease with heart failure and stage 1 through stage 4 chronic kidney disease, or unspecified chronic kidney disease: Secondary | ICD-10-CM | POA: Diagnosis not present

## 2023-12-03 DIAGNOSIS — I5022 Chronic systolic (congestive) heart failure: Secondary | ICD-10-CM | POA: Diagnosis not present

## 2023-12-03 DIAGNOSIS — D62 Acute posthemorrhagic anemia: Secondary | ICD-10-CM | POA: Diagnosis not present

## 2023-12-03 NOTE — Telephone Encounter (Signed)
 Noted.

## 2023-12-04 DIAGNOSIS — E1122 Type 2 diabetes mellitus with diabetic chronic kidney disease: Secondary | ICD-10-CM | POA: Diagnosis not present

## 2023-12-04 DIAGNOSIS — N183 Chronic kidney disease, stage 3 unspecified: Secondary | ICD-10-CM | POA: Diagnosis not present

## 2023-12-04 DIAGNOSIS — J9601 Acute respiratory failure with hypoxia: Secondary | ICD-10-CM | POA: Diagnosis not present

## 2023-12-04 DIAGNOSIS — D62 Acute posthemorrhagic anemia: Secondary | ICD-10-CM | POA: Diagnosis not present

## 2023-12-04 DIAGNOSIS — I480 Paroxysmal atrial fibrillation: Secondary | ICD-10-CM | POA: Diagnosis not present

## 2023-12-04 DIAGNOSIS — J95831 Postprocedural hemorrhage and hematoma of a respiratory system organ or structure following other procedure: Secondary | ICD-10-CM | POA: Diagnosis not present

## 2023-12-04 DIAGNOSIS — I5022 Chronic systolic (congestive) heart failure: Secondary | ICD-10-CM | POA: Diagnosis not present

## 2023-12-04 DIAGNOSIS — N179 Acute kidney failure, unspecified: Secondary | ICD-10-CM | POA: Diagnosis not present

## 2023-12-04 DIAGNOSIS — I130 Hypertensive heart and chronic kidney disease with heart failure and stage 1 through stage 4 chronic kidney disease, or unspecified chronic kidney disease: Secondary | ICD-10-CM | POA: Diagnosis not present

## 2023-12-06 ENCOUNTER — Encounter: Payer: Self-pay | Admitting: Nurse Practitioner

## 2023-12-06 ENCOUNTER — Ambulatory Visit: Attending: Nurse Practitioner | Admitting: Nurse Practitioner

## 2023-12-06 ENCOUNTER — Ambulatory Visit
Admission: RE | Admit: 2023-12-06 | Discharge: 2023-12-06 | Disposition: A | Discharge status: Home / Self Care | Source: Ambulatory Visit | Attending: Nurse Practitioner | Admitting: Nurse Practitioner

## 2023-12-06 VITALS — BP 98/68 | HR 96 | Ht 70.0 in | Wt 223.8 lb

## 2023-12-06 DIAGNOSIS — E1122 Type 2 diabetes mellitus with diabetic chronic kidney disease: Secondary | ICD-10-CM

## 2023-12-06 DIAGNOSIS — J90 Pleural effusion, not elsewhere classified: Secondary | ICD-10-CM | POA: Insufficient documentation

## 2023-12-06 DIAGNOSIS — I480 Paroxysmal atrial fibrillation: Secondary | ICD-10-CM

## 2023-12-06 DIAGNOSIS — I2510 Atherosclerotic heart disease of native coronary artery without angina pectoris: Secondary | ICD-10-CM | POA: Diagnosis not present

## 2023-12-06 DIAGNOSIS — R918 Other nonspecific abnormal finding of lung field: Secondary | ICD-10-CM | POA: Diagnosis not present

## 2023-12-06 DIAGNOSIS — J942 Hemothorax: Secondary | ICD-10-CM | POA: Diagnosis not present

## 2023-12-06 DIAGNOSIS — D631 Anemia in chronic kidney disease: Secondary | ICD-10-CM

## 2023-12-06 DIAGNOSIS — E1169 Type 2 diabetes mellitus with other specified complication: Secondary | ICD-10-CM

## 2023-12-06 DIAGNOSIS — E785 Hyperlipidemia, unspecified: Secondary | ICD-10-CM

## 2023-12-06 DIAGNOSIS — N183 Chronic kidney disease, stage 3 unspecified: Secondary | ICD-10-CM

## 2023-12-06 DIAGNOSIS — Z4682 Encounter for fitting and adjustment of non-vascular catheter: Secondary | ICD-10-CM | POA: Diagnosis not present

## 2023-12-06 DIAGNOSIS — I5032 Chronic diastolic (congestive) heart failure: Secondary | ICD-10-CM | POA: Diagnosis not present

## 2023-12-06 DIAGNOSIS — I5022 Chronic systolic (congestive) heart failure: Secondary | ICD-10-CM | POA: Diagnosis not present

## 2023-12-06 DIAGNOSIS — I5020 Unspecified systolic (congestive) heart failure: Secondary | ICD-10-CM

## 2023-12-06 NOTE — Patient Instructions (Signed)
 Medication Instructions:  No changes *If you need a refill on your cardiac medications before your next appointment, please call your pharmacy*   Lab Work: Your provider would like for you to have the following labs today: BMET and CBC  If you have labs (blood work) drawn today and your tests are completely normal, you will receive your results only by: MyChart Message (if you have MyChart) OR A paper copy in the mail If you have any lab test that is abnormal or we need to change your treatment, we will call you to review the results.   Testing/Procedures: Your provider has ordered a chest X-Ray for you. You can have this done at the [facility] medical mall. You do not need an appointment. Please go to the entrance of the Medical Mall and check in at the front desk.   Follow-Up: At [facility], you and your health needs are our priority.  As part of our continuing mission to provide you with exceptional heart care, we have created designated Provider Care Teams.  These Care Teams include your primary Cardiologist (physician) and Advanced Practice Providers (APPs -  Physician Assistants and Nurse Practitioners) who all work together to provide you with the care you need, when you need it.  We recommend signing up for the patient portal called "MyChart".  Sign up information is provided on this After Visit Summary.  MyChart is used to connect with patients for Virtual Visits (Telemedicine).  Patients are able to view lab/test results, encounter notes, upcoming appointments, etc.  Non-urgent messages can be sent to your provider as well.   To learn more about what you can do with MyChart, go to [email or web address].    Your next appointment:   1 month(s)  Provider:   Yvonne Kendall, MD    Other Instructions A referral has been placed to Dr. Karna Christmas. You may call [phone]

## 2023-12-06 NOTE — Progress Notes (Signed)
 Office Visit    Patient Name: [name] Young. Date of Encounter: 12/06/2023  Primary Care Provider:  [name], MD Primary Cardiologist:  Yvonne Kendall, MD  Chief Complaint    83 y.o. male with a history of CAD status post three-vessel bypass in May 2019, postop atrial fibrillation, chronic heart failure with midrange ejection fraction, ischemic cardiomyopathy, mitral valve endocarditis, hypertension, hyperlipidemia, type 2 diabetes mellitus, hepatic abscess, stage III chronic kidney disease, right pleural effusion, and recent diagnosis of hemothorax, who presents for follow-up after recent hospitalization.  Past Medical History  Subjective   Past Medical History:  Diagnosis Date   Adenomatous colon polyp    Aortic atherosclerosis (HCC)    Aortic stenosis    a. 11/2023 Echo: mild-mod AS, mean grad .   Arthritis    Ascending aorta dilatation (HCC)    a.) TTE 11/23/2017: asc Ao measured 37 mm; b.) TTE 10/23/2021: Ao root measured 41 mm, asc Ao measured 38 mm; c.) TEE 10/28/2021: asc Ao measured 38 mm; d.) TTE 01/11/2022: Ao root 40 mm, asc Ao 39 mm   Ascending cholangitis 10/2021   BPH (benign prostatic hyperplasia)    CKD (chronic kidney disease), stage III (HCC)    Claustrophobia    Coronary artery disease    a.) LHC 12/21/2017: 75% mLAD, 90% D1, 80-99% OM1/2, CTO pRCA (L-R collaterals) --> CVTS consult. b.) 01/2018 CABG x 3 (LIMA->LAD, VG->D1, VG->RPL).   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   In setting of bacteremia from ascending colangitis   GERD (gastroesophageal reflux disease)    Hemothorax on right 11/2023   HFrEF (heart failure with reduced ejection fraction) (HCC)    a.) TTE 11/23/2017: EF 50-55%, mod MAC, triv TR, G1DD; b.) TTE 10/23/2021: EF 40-45%, mild LAE, Ao sclerosis, triv MR, G2DD; c.) TEE 10/28/2021: EF 40-45%, glob HK, mobile vegitation on MV; d.) TTE 01/11/2022: EF 50-55%, mild LVH, RVE, Ao sclerosis, mild  MR/AR, G1DD   History of cholelithiasis    History of kidney stones    ca ox Vonita Moss @ [facility]) now Makena   History of pneumonia    HLD (hyperlipidemia)    HTN (hypertension)    Ischemic cardiomyopathy    a. 12/2022 Echo: EF 50-55%; b. 11/2023 Echo: EF 40-45%, glob HK, mild LVH, GrI DD, nl RV fxn, mild MR, mild AI, mild-mod AS, Ao root 43mm.   Jaundice    age 62   Long term current use of anticoagulant    a.) apixaban   Paroxysmal atrial fibrillation (HCC)    a.) CHA2DS2VASc = 6 (age x 2, HFrEF, HTN, vascular disease history, T2DM);  b.) rate/rhythm maintained on oral metoprolol succinate; chronically anticoagulated with apixaban   Pleural effusion    a. 11/2023 CT chest: Large R ple effusion, partially loculated-->s/p pleurx.   Pneumonia    Right-sided carotid artery disease (HCC)    a.) carotid doppler 04/05/2022: 1-39% RICA   S/P CABG x 3    a.) LIMA-LAD, SVG-diagonal, SVG-PL branch of RCA   S/P cataract extraction and insertion of intraocular lens    T2DM (type 2 diabetes mellitus) (HCC) 2010   Past Surgical History:  Procedure Laterality Date   CARPAL TUNNEL RELEASE Bilateral    CATARACT EXTRACTION, BILATERAL     COLONOSCOPY  11/2012   11 adenomatous polyps, diverticulosis, rec rpt 1 yr [name])   COLONOSCOPY  12/2013   3 polyps, diverticulosis, rec rpt 3 yrs [name])  COLONOSCOPY  06/2019   6 polyps (TA), diverticulosis, f/u left open ended [name])   CORONARY ARTERY BYPASS GRAFT N/A 01/18/2018   Procedure: CORONARY ARTERY BYPASS GRAFTING (CABG) x 3; Using Left Internal Mammary Artery, and Right Greater Saphenous Vein harvested Endoscopically, Coronary Artery Endarterectomy;  Surgeon: Kerin Perna, MD;  Location: [facility] OR;  Service: Open Heart Surgery;  Laterality: N/A;   ERCP N/A 10/23/2021   Procedure: ENDOSCOPIC RETROGRADE CHOLANGIOPANCREATOGRAPHY (ERCP);  Surgeon: Meryl Dare, MD;  Location: [facility] ENDOSCOPY;  Service: Endoscopy;  Laterality: N/A;   IR CATHETER TUBE  CHANGE  01/08/2023   IR PERC PLEURAL DRAIN W/INDWELL CATH W/IMG GUIDE  11/20/2023   IR RADIOLOGIST EVAL & MGMT  01/23/2023   IR THORACENTESIS ASP PLEURAL SPACE W/IMG GUIDE  12/29/2022   KNEE CARTILAGE SURGERY Left    LEFT HEART CATH AND CORONARY ANGIOGRAPHY N/A 12/21/2017   Procedure: LEFT HEART CATH AND CORONARY ANGIOGRAPHY;  Surgeon: Yvonne Kendall, MD;  Location: [facility] INVASIVE CV LAB;  Service: Cardiovascular;  Laterality: N/A;   LITHOTRIPSY     REMOVAL OF STONES  10/23/2021   Procedure: REMOVAL OF STONES;  Surgeon: Meryl Dare, MD;  Location[facility] ENDOSCOPY;  Service: Endoscopy;;   SPHINCTEROTOMY  10/23/2021   Procedure: Dennison Mascot;  Surgeon: Meryl Dare, MD;  Location: [facility] ENDOSCOPY;  Service: Endoscopy;;   TEE WITHOUT CARDIOVERSION N/A 01/18/2018   Procedure: TRANSESOPHAGEAL ECHOCARDIOGRAM (TEE);  Surgeon: Donata Clay, Theron Arista, MD;  Location: [facility] OR;  Service: Open Heart Surgery;  Laterality: N/A;   TEE WITHOUT CARDIOVERSION N/A 10/28/2021   Procedure: TRANSESOPHAGEAL ECHOCARDIOGRAM (TEE);  Surgeon: Chrystie Nose, MD;  Location: [facility] ENDOSCOPY;  Service: Cardiovascular;  Laterality: N/A;   UMBILICAL HERNIA REPAIR     with mesh    Allergies  Not on File    History of Present Illness      83 y.o. y/o male with a history of CAD status post three-vessel bypass in May 2019, postop atrial fibrillation, chronic heart failure with midrange ejection fraction, ischemic cardiomyopathy, mitral valve endocarditis, hypertension, hyperlipidemia, type 2 diabetes mellitus, hepatic abscess, stage III chronic kidney disease, right pleural effusion, and recent diagnosis of hemothorax.      In the setting of recurrent right pleural effusion, Mr. Poole has been evaluated several times this year with ongoing/recurrent dyspnea.  He had already undergone 3 thoracenteses by the time he was evaluated by Dr. Okey Dupre in early March 2025, but continued to report dyspnea and orthopnea.  Plan was for thoracentesis  followed by right and left heart cardiac catheterization and Eliquis was placed on hold.  He underwent thoracentesis on the right but did not have any improvement in dyspnea and subsequently developed right-sided pleuritic chest pain.  When he presented for his diagnostic catheterization on March 11 prior to the procedure, fluoroscopy was performed showing a large right pleural effusion with near complete collapse of the right lung.  The catheterization was canceled.  Follow-up chest x-ray confirmed a large right pleural effusion.  Hemoglobin dropped to 7.2 with hematocrit of 23.7.  White count was elevated at 13.5.  Patient was admitted and received 1 unit of packed red blood cells.  CT of the chest showed a large right pleural effusion which was partially loculated and complete consolidation throughout the right lower lobe with subpleural consolidation in the dependent right upper and right middle lobes.  He was seen by pulmonology underwent placement of a right Pleurx catheter.  Early in hospitalization, he was having frequent runs  of nonsustained VT which improved with an increased dose of metoprolol.  Echo during hospitalization showed slight reduction in EF to 40-45% w/ global HK, GrI DD, and mild MR/AI, and mild-mod AS.  He was treated with intravenous Zosyn out of concern for empyema.  Chest tube was removed March 20, and Mr. Livecchi was discharged home.  Since discharge, he has remained short of breath with limited activity.  He notes that he feels better than when he went into the hospital but does not feel that he is back to baseline.  He denies chest pain, palpitations, PND, orthopnea, dizziness, syncope, edema, or early satiety.  He does not currently have an appointment to follow-up with pulmonology and when he called to retrieve an appointment, he was told he would need a referral. Objective  Home Medications    Current Outpatient Medications  Medication Sig Dispense Refill   aspirin EC 81 MG  tablet Take 81 mg by mouth daily. Swallow whole.     Cholecalciferol (VITAMIN D3) 25 MCG (1000 UT) CAPS Take 1 capsule (1,000 Units total) by mouth daily. 30 capsule    Cyanocobalamin (B-12) 1000 MCG CAPS Take 1 capsule by mouth every Monday, Wednesday, and Friday.     docusate sodium (COLACE) 100 MG capsule Take 1 capsule (100 mg total) by mouth 2 (two) times daily as needed for mild constipation.     [Paused] ELIQUIS 5 MG TABS tablet TAKE 1 TABLET BY MOUTH TWICE A DAY 60 tablet 5   feeding supplement (ENSURE ENLIVE / ENSURE PLUS) LIQD Take 237 mLs by mouth 2 (two) times daily between meals. 14220 mL 0   furosemide (LASIX) 40 MG tablet Take 1 tablet (40 mg total) by mouth daily. 30 tablet 11   glucose blood (TRUE METRIX BLOOD GLUCOSE TEST) test strip Use as instructed to check blood sugar once a day 100 each 3   Magnesium 250 MG TABS Take 250 mg by mouth daily.     metoprolol tartrate (LOPRESSOR) 25 MG tablet Take 1 tablet (25 mg total) by mouth 2 (two) times daily. 60 tablet 2   mirtazapine (REMERON) 15 MG tablet Take 1 tablet (15 mg total) by mouth at bedtime. 90 tablet 4   Multiple Vitamins-Minerals (PRESERVISION AREDS 2 PO) Take 1 capsule by mouth daily.     nitroGLYCERIN (NITROSTAT) 0.4 MG SL tablet Place 1 tablet (0.4 mg total) under the tongue every 5 (five) minutes as needed for chest pain. 25 tablet 4   oxyCODONE (OXY IR/ROXICODONE) 5 MG immediate release tablet Take 1 tablet (5 mg total) by mouth every 4 (four) hours as needed for moderate pain (pain score 4-6). 30 tablet 0   rosuvastatin (CRESTOR) 10 MG tablet TAKE 1 TABLET BY MOUTH EVERY DAY 90 tablet 0   tamsulosin (FLOMAX) 0.4 MG CAPS capsule Take 1 capsule (0.4 mg total) by mouth daily. 90 capsule 4   acetaminophen (TYLENOL) 325 MG tablet Take 2 tablets (650 mg total) by mouth every 6 (six) hours as needed.     No current facility-administered medications for this visit.     Physical Exam    VS:  BP 98/68   Pulse 96   Ht 5'  10" (1.778 m)   Wt 223 lb 12.8 oz (101.5 kg)   SpO2 91%   BMI 32.11 kg/m  , BMI Body mass index is 32.11 kg/m.       GEN: Well nourished, well developed, in no acute distress. HEENT: normal. Neck: Supple, no JVD,  carotid bruits, or masses. Cardiac: Irregular, 2/6 systolic murmur throughout.  No rubs or gallops. No clubbing, cyanosis, edema.  Radials 2+/PT 2+ and equal bilaterally.  Prior chest tube site on the right lateral chest without erythema, bleeding, or discharge.  No crepitus. Respiratory:  Respirations regular and unlabored, markedly diminished breath sounds at the right base for approximately one third of the way up.  Clear to auscultation on the left. GI: Soft, nontender, nondistended, BS + x 4. MS: no deformity or atrophy. Skin: warm and dry, no rash. Neuro:  Strength and sensation are intact. Psych: Normal affect.  Accessory Clinical Findings    ECG personally reviewed by me today - EKG Interpretation Date/Time:  Thursday December 06 2023 15:22:11 EDT Ventricular Rate:  90 PR Interval:  154 QRS Duration:  86 QT Interval:  380 QTC Calculation: 464 R Axis:   37  Text Interpretation: Sinus rhythm with occasional Premature ventricular complexes Nonspecific ST and T wave abnormality Confirmed by Nicolasa Ducking [phone]) on 12/06/2023 3:23:34 PM  - no acute changes.  Lab Results  Component Value Date   WBC 11.0 (H) 11/29/2023   HGB 8.5 (L) 11/29/2023   HCT 28.4 (L) 11/29/2023   MCV 87.1 11/29/2023   PLT 372 11/29/2023   Lab Results  Component Value Date   CREATININE 1.32 (H) 11/29/2023   BUN 56 (H) 11/29/2023   NA 139 11/29/2023   K 3.9 11/29/2023   CL 94 (L) 11/29/2023   CO2 33 (H) 11/29/2023   Lab Results  Component Value Date   ALT 38 11/20/2023   AST 37 11/20/2023   ALKPHOS 68 11/20/2023   BILITOT 0.6 11/20/2023   Lab Results  Component Value Date   CHOL 132 07/09/2023   HDL 30.00 (L) 07/09/2023   LDLCALC 53 07/09/2023   LDLDIRECT 66.0 06/07/2021    TRIG 244.0 (H) 07/09/2023   CHOLHDL 4 07/09/2023    Lab Results  Component Value Date   HGBA1C 6.9 (A) 11/13/2023   Lab Results  Component Value Date   TSH 4.56 07/09/2023       Assessment & Plan    1.  Recurrent right pleural effusion/hemothorax: Status post multiple thoracenteses this year with recent hospitalization in the setting of marked dyspnea and orthopnea and finding of hemothorax requiring chest tube placement and discontinuation Eliquis.  Since hospital discharge, he remains dyspneic on exertion, though better than prior to his most recent hospitalization.  On examination, he has markedly diminished breath sounds for about a third of the way up on the right and is clear to auscultation on the left.  Though his discharge AVS suggests that he will be contacted for pulmonology follow-up, this is yet to occur.  I will make pulmonology referral.  Follow-up with CBC, basic metabolic panel, and PA and lateral chest x-ray today.  2.  Chronic heart failure with midrange ejection fraction: Prior history of LV dysfunction with improvement to 50-55% by echo April 2024.  In the setting of recent hospitalization, patient underwent repeat echo which showed an EF of 40-45% with global hypokinesis, mild MR/AI, mild to moderate aortic stenosis, and grade 1 diastolic dysfunction.  Prior plan for was right and left heart cardiac catheterization in the setting of recurrent pleural effusion with question of heart failure contribution however, this plan is currently on hold in light of recent development of hemothorax.  Plan for further evaluation of dyspnea and pleural effusion as outlined above.  Reconsider ischemic evaluation in hemodynamic testing once stable.  In the setting of relative hypotension with blood pressure 98/68, he is only on low-dose beta-blocker therapy.  3.  Coronary artery disease: Status post prior three-vessel bypass in May 2019.  He has not been having any chest pain.  As above,  plan for regular tachycardia catheterization currently on hold.  Continue beta-blocker and statin therapy.  He is also currently on aspirin as Eliquis has been on hold.  4.  History of mitral valve endocarditis: No evidence of recurrence with recent echo showing stable, mild mitral regurgitation.  5.  Aortic stenosis: Stable and mild to moderate on recent echocardiogram.  6.  Paroxysmal atrial fibrillation: Maintaining sinus rhythm with PACs and PVCs.  Continue beta-blocker.  Eliquis on hold in the setting of recent hemothorax.  7.  Type 2 diabetes mellitus: Diet controlled with A1c of 6.9 earlier this month.  8.  Stage III chronic kidney disease: Creatinine 1.32 on March 20.  Follow-up basic metabolic panel today.  9.  Hyperlipidemia: LDL of 53 last year.  Normal LFTs recently.  Continue statin therapy.  10.  Anemia of chronic disease: Status post 1 unit of packed red blood cells in the setting of hemothorax during recent hospitalization.  Follow-up CBC today.    11.  Disposition: Follow-up CBC, basic metabolic panel, and PA lateral chest x-ray today.  Referral made to [facility] pulmonology so he can reestablish care with Dr. Karna Christmas, who managed him in the hospital.  Follow-up with Dr. Okey Dupre in 1 month or sooner if necessary.  Nicolasa Ducking, NP 12/06/2023, 5:41 PM

## 2023-12-07 LAB — BASIC METABOLIC PANEL WITH GFR
BUN/Creatinine Ratio: 41 — ABNORMAL HIGH (ref 10–24)
BUN: 40 mg/dL — ABNORMAL HIGH (ref 8–27)
CO2: 30 mmol/L — ABNORMAL HIGH (ref 20–29)
Calcium: 8.6 mg/dL (ref 8.6–10.2)
Chloride: 92 mmol/L — ABNORMAL LOW (ref 96–106)
Creatinine, Ser: 0.98 mg/dL (ref 0.76–1.27)
Glucose: 144 mg/dL — ABNORMAL HIGH (ref 70–99)
Potassium: 4 mmol/L (ref 3.5–5.2)
Sodium: 138 mmol/L (ref 134–144)
eGFR: 77 mL/min/{1.73_m2} (ref >59)

## 2023-12-07 LAB — CBC
Hematocrit: 29.6 % — ABNORMAL LOW (ref 37.5–51.0)
Hemoglobin: 8.8 g/dL — ABNORMAL LOW (ref 13.0–17.7)
MCH: 25.6 pg — ABNORMAL LOW (ref 26.6–33.0)
MCHC: 29.7 g/dL — ABNORMAL LOW (ref 31.5–35.7)
MCV: 86 fL (ref 79–97)
Platelets: 532 10*3/uL — ABNORMAL HIGH (ref 150–450)
RBC: 3.44 x10E6/uL — ABNORMAL LOW (ref 4.14–5.80)
RDW: 16.3 % — ABNORMAL HIGH (ref 11.6–15.4)
WBC: 10.2 10*3/uL (ref 3.4–10.8)

## 2023-12-10 ENCOUNTER — Encounter: Payer: Self-pay | Admitting: Family Medicine

## 2023-12-10 ENCOUNTER — Ambulatory Visit: Admitting: Family Medicine

## 2023-12-10 VITALS — BP 124/70 | HR 42 | Temp 98.3°F | Ht 70.0 in | Wt 223.2 lb

## 2023-12-10 DIAGNOSIS — Z515 Encounter for palliative care: Secondary | ICD-10-CM

## 2023-12-10 DIAGNOSIS — R001 Bradycardia, unspecified: Secondary | ICD-10-CM | POA: Diagnosis not present

## 2023-12-10 DIAGNOSIS — R627 Adult failure to thrive: Secondary | ICD-10-CM | POA: Diagnosis not present

## 2023-12-10 DIAGNOSIS — D649 Anemia, unspecified: Secondary | ICD-10-CM | POA: Diagnosis not present

## 2023-12-10 DIAGNOSIS — I480 Paroxysmal atrial fibrillation: Secondary | ICD-10-CM

## 2023-12-10 DIAGNOSIS — E43 Unspecified severe protein-calorie malnutrition: Secondary | ICD-10-CM

## 2023-12-10 DIAGNOSIS — N183 Chronic kidney disease, stage 3 unspecified: Secondary | ICD-10-CM

## 2023-12-10 DIAGNOSIS — I130 Hypertensive heart and chronic kidney disease with heart failure and stage 1 through stage 4 chronic kidney disease, or unspecified chronic kidney disease: Secondary | ICD-10-CM | POA: Diagnosis not present

## 2023-12-10 DIAGNOSIS — J95831 Postprocedural hemorrhage and hematoma of a respiratory system organ or structure following other procedure: Secondary | ICD-10-CM | POA: Diagnosis not present

## 2023-12-10 DIAGNOSIS — R531 Weakness: Secondary | ICD-10-CM | POA: Diagnosis not present

## 2023-12-10 DIAGNOSIS — J942 Hemothorax: Secondary | ICD-10-CM | POA: Diagnosis not present

## 2023-12-10 DIAGNOSIS — K5900 Constipation, unspecified: Secondary | ICD-10-CM

## 2023-12-10 DIAGNOSIS — N179 Acute kidney failure, unspecified: Secondary | ICD-10-CM | POA: Diagnosis not present

## 2023-12-10 DIAGNOSIS — D62 Acute posthemorrhagic anemia: Secondary | ICD-10-CM | POA: Diagnosis not present

## 2023-12-10 DIAGNOSIS — J9601 Acute respiratory failure with hypoxia: Secondary | ICD-10-CM | POA: Diagnosis not present

## 2023-12-10 DIAGNOSIS — I5022 Chronic systolic (congestive) heart failure: Secondary | ICD-10-CM | POA: Diagnosis not present

## 2023-12-10 DIAGNOSIS — E1122 Type 2 diabetes mellitus with diabetic chronic kidney disease: Secondary | ICD-10-CM | POA: Diagnosis not present

## 2023-12-10 DIAGNOSIS — J90 Pleural effusion, not elsewhere classified: Secondary | ICD-10-CM

## 2023-12-10 MED ORDER — POLYETHYLENE GLYCOL 3350 17 GM/SCOOP PO POWD: 8.5000 g | Freq: Every day | ORAL | 1 refills | Status: DC

## 2023-12-10 NOTE — Progress Notes (Unsigned)
 Ph: [phone] Fax: [phone]   Patient ID: [name] Merl., male    DOB: 1940-09-23, 83 y.o.   MRN: [id number]  This visit was conducted in person.  BP 124/70   Pulse (!) 42   Temp 98.3 F (36.8 C) (Oral)   Ht 5\' 10"  (1.778 m)   Wt 223 lb 4 oz (101.3 kg)   SpO2 94%   BMI 32.03 kg/m    CC: hosp f/u visit  Subjective:   HPI: [name] Kresse. is a 83 y.o. male presenting on 12/10/2023 for Hospitalization Follow-up (Admitted on 11/20/23 at [facility], dx CAD involving native coronary artery; chronic HFrEF. Pt accompanied by wife, [name]./)   Recent hospitalization for progressive dyspnea complicated by hemothorax necessitating chest tube placement and holding of eliquis. This was found when he presented for cardiac catheterization.  Hospital records reviewed. Med rec performed.  He received IV Zosyn for empyema.  Received 1u pRBC 11/20/2023 for Hgb nadir to 7.2. discharge Hgb 8.5, stable on recheck. *** Lasix was continued, metolazone was held.   No appetite - food tastes bad.  Ongoing constipation despite dulcolax stool softener - managing with magnesium citrate - will recommend start miralax.  No abd pain, fevers/chills.   Staying markedly fatigued.  [place]   Repeat echocardiogram while hopsitalized showed EF 40-45% with global hypokinesis, mild MR/AI, mild - mod AS and G1DD. Cardiac catheterization was postponed due to above.   Pulmonology referral was placed by cardiology on 12/06/2023 (to Dr Karna Christmas at [facility]).   [facility]  Other follow up appointments scheduled: saw cardiology last week Ward Givens.  ______________________________________________________________________ Hospital admission: 11/20/2023 Hospital discharge: 11/29/2023 TCM f/u phone call: not performed   Recommendations at discharge:  Follow up with Pulmonology Follow up with Primary Care in 1 week Follow up with your other outpatient Specialists as  previously scheduled Repeat CBC,. BMP, Mg at follow up Consider resuming Eliquis after repeat CBC, if clinically appropriate   Discharge Diagnoses: Principal Problem:   Hemothorax Active Problems:   Coronary artery disease   PAF (paroxysmal atrial fibrillation) (HCC)   Aortic valve stenosis   Chronic diastolic CHF (congestive heart failure) (HCC)   Hemothorax on right     Relevant past medical, surgical, family and social history reviewed and updated as indicated. Interim medical history since our last visit reviewed. Allergies and medications reviewed and updated. Outpatient Medications Prior to Visit  Medication Sig Dispense Refill   aspirin EC 81 MG tablet Take 81 mg by mouth daily. Swallow whole.     Cholecalciferol (VITAMIN D3) 25 MCG (1000 UT) CAPS Take 1 capsule (1,000 Units total) by mouth daily. 30 capsule    Cyanocobalamin (B-12) 1000 MCG CAPS Take 1 capsule by mouth every Monday, Wednesday, and Friday.     docusate sodium (COLACE) 100 MG capsule Take 1 capsule (100 mg total) by mouth 2 (two) times daily as needed for mild constipation.     feeding supplement (ENSURE ENLIVE / ENSURE PLUS) LIQD Take 237 mLs by mouth 2 (two) times daily between meals. 14220 mL 0   furosemide (LASIX) 40 MG tablet Take 1 tablet (40 mg total) by mouth daily. 30 tablet 11   glucose blood (TRUE METRIX BLOOD GLUCOSE TEST) test strip Use as instructed to check blood sugar once a day 100 each 3   Magnesium 250 MG TABS Take 250 mg by mouth daily.     metoprolol tartrate (LOPRESSOR) 25 MG  tablet Take 1 tablet (25 mg total) by mouth 2 (two) times daily. 60 tablet 2   mirtazapine (REMERON) 15 MG tablet Take 1 tablet (15 mg total) by mouth at bedtime. 90 tablet 4   Multiple Vitamins-Minerals (PRESERVISION AREDS 2 PO) Take 1 capsule by mouth daily.     nitroGLYCERIN (NITROSTAT) 0.4 MG SL tablet Place 1 tablet (0.4 mg total) under the tongue every 5 (five) minutes as needed for chest pain. 25 tablet 4    oxyCODONE (OXY IR/ROXICODONE) 5 MG immediate release tablet Take 1 tablet (5 mg total) by mouth every 4 (four) hours as needed for moderate pain (pain score 4-6). 30 tablet 0   rosuvastatin (CRESTOR) 10 MG tablet TAKE 1 TABLET BY MOUTH EVERY DAY 90 tablet 0   tamsulosin (FLOMAX) 0.4 MG CAPS capsule Take 1 capsule (0.4 mg total) by mouth daily. 90 capsule 4   ELIQUIS 5 MG TABS tablet TAKE 1 TABLET BY MOUTH TWICE A DAY (Patient not taking: Reported on 12/10/2023) 60 tablet 5   acetaminophen (TYLENOL) 325 MG tablet Take 2 tablets (650 mg total) by mouth every 6 (six) hours as needed.     No facility-administered medications prior to visit.     Per HPI unless specifically indicated in ROS section below Review of Systems  Objective:  BP 124/70   Pulse (!) 42   Temp 98.3 F (36.8 C) (Oral)   Ht 5\' 10"  (1.778 m)   Wt 223 lb 4 oz (101.3 kg)   SpO2 94%   BMI 32.03 kg/m   Wt Readings from Last 3 Encounters:  12/10/23 223 lb 4 oz (101.3 kg)  12/06/23 223 lb 12.8 oz (101.5 kg)  11/29/23 226 lb 6.6 oz (102.7 kg)      Physical Exam    Results for orders placed or performed in visit on 12/06/23  CBC   Collection Time: 12/06/23  3:29 PM  Result Value Ref Range   WBC 10.2 3.4 - 10.8 x10E3/uL   RBC 3.44 (L) 4.14 - 5.80 x10E6/uL   Hemoglobin 8.8 (L) 13.0 - 17.7 g/dL   Hematocrit 16.1 (L) 09.6 - 51.0 %   MCV 86 79 - 97 fL   MCH 25.6 (L) 26.6 - 33.0 pg   MCHC 29.7 (L) 31.5 - 35.7 g/dL   RDW 04.5 (H) 40.9 - 81.1 %   Platelets 532 (H) 150 - 450 x10E3/uL  Basic Metabolic Panel (BMET)   Collection Time: 12/06/23  3:29 PM  Result Value Ref Range   Glucose 144 (H) 70 - 99 mg/dL   BUN 40 (H) 8 - 27 mg/dL   Creatinine, Ser 9.14 0.76 - 1.27 mg/dL   eGFR 77 >78 GN/FAO/1.30   BUN/Creatinine Ratio 41 (H) 10 - 24   Sodium 138 134 - 144 mmol/L   Potassium 4.0 3.5 - 5.2 mmol/L   Chloride 92 (L) 96 - 106 mmol/L   CO2 30 (H) 20 - 29 mmol/L   Calcium 8.6 8.6 - 10.2 mg/dL    Assessment & Plan:    Problem List Items Addressed This Visit   None    No orders of the defined types were placed in this encounter.   No orders of the defined types were placed in this encounter.   There are no Patient Instructions on file for this visit.  Follow up plan: No follow-ups on file.  [name], MD

## 2023-12-10 NOTE — Patient Instructions (Addendum)
 Ok to continue stool softener.  Start miralax powder 1/2 - 1 capful daily.  Drop lopressor (metoprolol) to 1/2 tablet (12.5mg  twice daily)  Continue other medicines.  Continue to hold eliquis.  Return in 2 weeks to see me.  I will ask palliative care team for evaluation.

## 2023-12-11 ENCOUNTER — Encounter: Payer: Self-pay | Admitting: Family Medicine

## 2023-12-11 NOTE — Assessment & Plan Note (Signed)
 Markedly low albumin (2s) with decreased appetite, weight loss noted.  Consider glucerna vs boost supplementation.

## 2023-12-11 NOTE — Assessment & Plan Note (Signed)
 Pending HHPT

## 2023-12-11 NOTE — Assessment & Plan Note (Signed)
 S/p recurrent thoracenteses, latest complicated by hemothorax and ?empyema  Overall stable respiratory status.  Reviewed latest CXR from last week - stable to improved. Formal read not back yet.  Lungs sound stable today

## 2023-12-11 NOTE — Assessment & Plan Note (Addendum)
 22 lb weight loss over the past 2 months.  Remaining markedly fatigued, weak. Dyspnea remains but overall improved.  Lung exam also improved.  HH now involved.  He is DNR but not ready for hospice care.  They agree to palliative care referral.

## 2023-12-11 NOTE — Assessment & Plan Note (Signed)
 Refer to outpatient palliative care team.

## 2023-12-11 NOTE — Assessment & Plan Note (Signed)
 Ongoing constipation despite daily stool softener. Will start miralax 1/2-1 capful daily, hold for diarrhea.

## 2023-12-11 NOTE — Assessment & Plan Note (Addendum)
 Recent anemia worsened after blood loss from hemothorax.  Received 1u pRBC in hospital.  Rpt CBC last week stable.  Continue to monitor.

## 2023-12-11 NOTE — Assessment & Plan Note (Signed)
 Latest kidney function stable now off metolazone (GFR 77) Continue lasix 40mg  daily, continue to hold metolazone.

## 2023-12-11 NOTE — Assessment & Plan Note (Addendum)
 H/o this, eliquis remains on hold after recent hemothorax.  Will continue to hold at this time.  Sounds irregular, bradycardic today - drop metoprolol to 12.5mg  BID.  Latest EKG from last week reviewed- sinus rhythm with occ PVC.  Reassess eliquis re-commencement at f/u in 2 wks.

## 2023-12-11 NOTE — Assessment & Plan Note (Addendum)
 Marked bradycardia noted today along with irregularity.  Will drop metoprolol tartrate from 25mg  bid to 12.5mg  bid.  Has cards f/u next month.

## 2023-12-11 NOTE — Assessment & Plan Note (Signed)
 S/p treatment with chest tube placement.  Eliquis remains on hold at this time.  Latest CBC stable with Hgb 8.8.  He also received IV Zosyn for empyema.

## 2023-12-12 DIAGNOSIS — N183 Chronic kidney disease, stage 3 unspecified: Secondary | ICD-10-CM | POA: Diagnosis not present

## 2023-12-12 DIAGNOSIS — D62 Acute posthemorrhagic anemia: Secondary | ICD-10-CM | POA: Diagnosis not present

## 2023-12-12 DIAGNOSIS — J9601 Acute respiratory failure with hypoxia: Secondary | ICD-10-CM | POA: Diagnosis not present

## 2023-12-12 DIAGNOSIS — J95831 Postprocedural hemorrhage and hematoma of a respiratory system organ or structure following other procedure: Secondary | ICD-10-CM | POA: Diagnosis not present

## 2023-12-12 DIAGNOSIS — N179 Acute kidney failure, unspecified: Secondary | ICD-10-CM | POA: Diagnosis not present

## 2023-12-12 DIAGNOSIS — I480 Paroxysmal atrial fibrillation: Secondary | ICD-10-CM | POA: Diagnosis not present

## 2023-12-12 DIAGNOSIS — E1122 Type 2 diabetes mellitus with diabetic chronic kidney disease: Secondary | ICD-10-CM | POA: Diagnosis not present

## 2023-12-12 DIAGNOSIS — I5022 Chronic systolic (congestive) heart failure: Secondary | ICD-10-CM | POA: Diagnosis not present

## 2023-12-12 DIAGNOSIS — I130 Hypertensive heart and chronic kidney disease with heart failure and stage 1 through stage 4 chronic kidney disease, or unspecified chronic kidney disease: Secondary | ICD-10-CM | POA: Diagnosis not present

## 2023-12-12 NOTE — Addendum Note (Signed)
 Addended by: [name] on: 12/12/2023 05:35 PM   Modules accepted: Orders

## 2023-12-12 NOTE — Telephone Encounter (Signed)
 New pulm referral placed. Pt saw Dr Karna Christmas during recent hospitalization

## 2023-12-13 ENCOUNTER — Ambulatory Visit: Payer: Medicare PPO | Admitting: Podiatry

## 2023-12-13 ENCOUNTER — Encounter: Payer: Self-pay | Admitting: Podiatry

## 2023-12-13 DIAGNOSIS — M79675 Pain in left toe(s): Secondary | ICD-10-CM | POA: Diagnosis not present

## 2023-12-13 DIAGNOSIS — I130 Hypertensive heart and chronic kidney disease with heart failure and stage 1 through stage 4 chronic kidney disease, or unspecified chronic kidney disease: Secondary | ICD-10-CM | POA: Diagnosis not present

## 2023-12-13 DIAGNOSIS — E1142 Type 2 diabetes mellitus with diabetic polyneuropathy: Secondary | ICD-10-CM | POA: Diagnosis not present

## 2023-12-13 DIAGNOSIS — B351 Tinea unguium: Secondary | ICD-10-CM

## 2023-12-13 DIAGNOSIS — I5022 Chronic systolic (congestive) heart failure: Secondary | ICD-10-CM | POA: Diagnosis not present

## 2023-12-13 DIAGNOSIS — M79674 Pain in right toe(s): Secondary | ICD-10-CM

## 2023-12-13 DIAGNOSIS — I480 Paroxysmal atrial fibrillation: Secondary | ICD-10-CM | POA: Diagnosis not present

## 2023-12-13 DIAGNOSIS — N183 Chronic kidney disease, stage 3 unspecified: Secondary | ICD-10-CM | POA: Diagnosis not present

## 2023-12-13 DIAGNOSIS — D62 Acute posthemorrhagic anemia: Secondary | ICD-10-CM | POA: Diagnosis not present

## 2023-12-13 DIAGNOSIS — N179 Acute kidney failure, unspecified: Secondary | ICD-10-CM | POA: Diagnosis not present

## 2023-12-13 DIAGNOSIS — E1122 Type 2 diabetes mellitus with diabetic chronic kidney disease: Secondary | ICD-10-CM | POA: Diagnosis not present

## 2023-12-13 DIAGNOSIS — J9601 Acute respiratory failure with hypoxia: Secondary | ICD-10-CM | POA: Diagnosis not present

## 2023-12-13 DIAGNOSIS — J95831 Postprocedural hemorrhage and hematoma of a respiratory system organ or structure following other procedure: Secondary | ICD-10-CM | POA: Diagnosis not present

## 2023-12-13 DIAGNOSIS — L600 Ingrowing nail: Secondary | ICD-10-CM

## 2023-12-17 ENCOUNTER — Encounter: Payer: Self-pay | Admitting: Podiatry

## 2023-12-17 DIAGNOSIS — N179 Acute kidney failure, unspecified: Secondary | ICD-10-CM | POA: Diagnosis not present

## 2023-12-17 DIAGNOSIS — D62 Acute posthemorrhagic anemia: Secondary | ICD-10-CM | POA: Diagnosis not present

## 2023-12-17 DIAGNOSIS — J9601 Acute respiratory failure with hypoxia: Secondary | ICD-10-CM | POA: Diagnosis not present

## 2023-12-17 DIAGNOSIS — I130 Hypertensive heart and chronic kidney disease with heart failure and stage 1 through stage 4 chronic kidney disease, or unspecified chronic kidney disease: Secondary | ICD-10-CM | POA: Diagnosis not present

## 2023-12-17 DIAGNOSIS — I480 Paroxysmal atrial fibrillation: Secondary | ICD-10-CM | POA: Diagnosis not present

## 2023-12-17 DIAGNOSIS — E1122 Type 2 diabetes mellitus with diabetic chronic kidney disease: Secondary | ICD-10-CM | POA: Diagnosis not present

## 2023-12-17 DIAGNOSIS — J95831 Postprocedural hemorrhage and hematoma of a respiratory system organ or structure following other procedure: Secondary | ICD-10-CM | POA: Diagnosis not present

## 2023-12-17 DIAGNOSIS — N183 Chronic kidney disease, stage 3 unspecified: Secondary | ICD-10-CM | POA: Diagnosis not present

## 2023-12-17 DIAGNOSIS — I5022 Chronic systolic (congestive) heart failure: Secondary | ICD-10-CM | POA: Diagnosis not present

## 2023-12-17 NOTE — Progress Notes (Signed)
  Subjective:  Patient ID: [name] Merl., male    DOB: 1941/02/04,  MRN: [id number]  [name] Merl. presents to clinic today for at risk foot care with history of diabetic neuropathy and painful, elongated thickened toenails x 10 which are symptomatic when wearing enclosed shoe gear. This interferes with his/her daily activities.  Chief Complaint  Patient presents with   Diabetes    "Trim my nails."  Dr. Jeraldine Loots - 12/10/2023; A1c - 7.7   New problem(s): None.   PCP is [name], MD.  No Known Allergies  Review of Systems: Negative except as noted in the HPI.  Objective: No changes noted in today's physical examination. There were no vitals filed for this visit. [name] Paolillo. is a pleasant 83 y.o. male obese in NAD. AAO x 3.  Vascular Examination: CFT <3 seconds b/l. DP/PT pulses faintly palpable b/l. Skin temperature gradient warm to warm b/l. No pain with calf compression. No ischemia or gangrene. No cyanosis or clubbing noted b/l. Pedal hair absent.   Neurological Examination: Protective sensation diminished with 10g monofilament b/l.  Dermatological Examination: Pedal skin warm and supple b/l.   No open wounds. No interdigital macerations.  Toenails left great toe and 2-5 b/l thick, discolored, elongated with subungual debris and pain on dorsal palpation.    Incurvated nailplate right great toe lateral border(s)  with signs of early paronychia with tenderness to palpation. No erythema, no edema, no drainage noted.  No corns, calluses nor porokeratotic lesions noted.  Musculoskeletal Examination: Normal muscle strength 5/5 to all lower extremity muscle groups bilaterally. Uses walking stick for ambulation assistance.  Radiographs: None  Assessment/Plan: 1. Pain due to onychomycosis of toenails of both feet   2. Ingrown toenail of right foot   3. Pain in toe of right foot   4. Diabetic polyneuropathy associated with type 2 diabetes  mellitus (HCC)     -Patient's family member present. All questions/concerns addressed on today's visit. -Examined patient. -Continue foot and shoe inspections daily. Monitor blood glucose per PCP/Endocrinologist's recommendations. -Patient to continue soft, supportive shoe gear daily. -Mycotic toenails 2-5 bilaterally and left great toe were debrided in length and girth with sterile nail nippers and dremel without iatrogenic bleeding. -No invasive procedure(s) performed. Offending nail border debrided and curretaged lateral border right hallux utilizing sterile nail nipper and currette. Border(s) cleansed with hydrogen peroxide and alcohol. Triple antibiotic ointment applied. Dispensed written instructions for once daily epsom salt soaks for 7 days. Call office if there are any concerns. -Patient/POA to call should there be question/concern in the interim.   Return in about 3 months (around 03/13/2024).  Freddie Breech, DPM      [place] LOCATION: [address] [place] [address]                   Office [phone]   [facility] LOCATION: [address] [place], [place] [address] Office [phone]

## 2023-12-18 DIAGNOSIS — N183 Chronic kidney disease, stage 3 unspecified: Secondary | ICD-10-CM | POA: Diagnosis not present

## 2023-12-18 DIAGNOSIS — J95831 Postprocedural hemorrhage and hematoma of a respiratory system organ or structure following other procedure: Secondary | ICD-10-CM | POA: Diagnosis not present

## 2023-12-18 DIAGNOSIS — I5022 Chronic systolic (congestive) heart failure: Secondary | ICD-10-CM | POA: Diagnosis not present

## 2023-12-18 DIAGNOSIS — D62 Acute posthemorrhagic anemia: Secondary | ICD-10-CM | POA: Diagnosis not present

## 2023-12-18 DIAGNOSIS — E1122 Type 2 diabetes mellitus with diabetic chronic kidney disease: Secondary | ICD-10-CM | POA: Diagnosis not present

## 2023-12-18 DIAGNOSIS — I130 Hypertensive heart and chronic kidney disease with heart failure and stage 1 through stage 4 chronic kidney disease, or unspecified chronic kidney disease: Secondary | ICD-10-CM | POA: Diagnosis not present

## 2023-12-18 DIAGNOSIS — N179 Acute kidney failure, unspecified: Secondary | ICD-10-CM | POA: Diagnosis not present

## 2023-12-18 DIAGNOSIS — I480 Paroxysmal atrial fibrillation: Secondary | ICD-10-CM | POA: Diagnosis not present

## 2023-12-18 DIAGNOSIS — J9601 Acute respiratory failure with hypoxia: Secondary | ICD-10-CM | POA: Diagnosis not present

## 2023-12-19 ENCOUNTER — Other Ambulatory Visit: Payer: Self-pay | Admitting: Internal Medicine

## 2023-12-19 DIAGNOSIS — I5020 Unspecified systolic (congestive) heart failure: Secondary | ICD-10-CM | POA: Diagnosis not present

## 2023-12-19 DIAGNOSIS — J90 Pleural effusion, not elsewhere classified: Secondary | ICD-10-CM | POA: Diagnosis not present

## 2023-12-20 DIAGNOSIS — J9601 Acute respiratory failure with hypoxia: Secondary | ICD-10-CM | POA: Diagnosis not present

## 2023-12-20 DIAGNOSIS — E1122 Type 2 diabetes mellitus with diabetic chronic kidney disease: Secondary | ICD-10-CM | POA: Diagnosis not present

## 2023-12-20 DIAGNOSIS — N183 Chronic kidney disease, stage 3 unspecified: Secondary | ICD-10-CM | POA: Diagnosis not present

## 2023-12-20 DIAGNOSIS — N179 Acute kidney failure, unspecified: Secondary | ICD-10-CM | POA: Diagnosis not present

## 2023-12-20 DIAGNOSIS — I5022 Chronic systolic (congestive) heart failure: Secondary | ICD-10-CM | POA: Diagnosis not present

## 2023-12-20 DIAGNOSIS — I480 Paroxysmal atrial fibrillation: Secondary | ICD-10-CM | POA: Diagnosis not present

## 2023-12-20 DIAGNOSIS — I130 Hypertensive heart and chronic kidney disease with heart failure and stage 1 through stage 4 chronic kidney disease, or unspecified chronic kidney disease: Secondary | ICD-10-CM | POA: Diagnosis not present

## 2023-12-20 DIAGNOSIS — J95831 Postprocedural hemorrhage and hematoma of a respiratory system organ or structure following other procedure: Secondary | ICD-10-CM | POA: Diagnosis not present

## 2023-12-20 DIAGNOSIS — D62 Acute posthemorrhagic anemia: Secondary | ICD-10-CM | POA: Diagnosis not present

## 2023-12-24 DIAGNOSIS — J9601 Acute respiratory failure with hypoxia: Secondary | ICD-10-CM | POA: Diagnosis not present

## 2023-12-24 DIAGNOSIS — E1122 Type 2 diabetes mellitus with diabetic chronic kidney disease: Secondary | ICD-10-CM | POA: Diagnosis not present

## 2023-12-24 DIAGNOSIS — I480 Paroxysmal atrial fibrillation: Secondary | ICD-10-CM | POA: Diagnosis not present

## 2023-12-24 DIAGNOSIS — N183 Chronic kidney disease, stage 3 unspecified: Secondary | ICD-10-CM | POA: Diagnosis not present

## 2023-12-24 DIAGNOSIS — I130 Hypertensive heart and chronic kidney disease with heart failure and stage 1 through stage 4 chronic kidney disease, or unspecified chronic kidney disease: Secondary | ICD-10-CM | POA: Diagnosis not present

## 2023-12-24 DIAGNOSIS — I5022 Chronic systolic (congestive) heart failure: Secondary | ICD-10-CM | POA: Diagnosis not present

## 2023-12-24 DIAGNOSIS — D62 Acute posthemorrhagic anemia: Secondary | ICD-10-CM | POA: Diagnosis not present

## 2023-12-24 DIAGNOSIS — J95831 Postprocedural hemorrhage and hematoma of a respiratory system organ or structure following other procedure: Secondary | ICD-10-CM | POA: Diagnosis not present

## 2023-12-24 DIAGNOSIS — N179 Acute kidney failure, unspecified: Secondary | ICD-10-CM | POA: Diagnosis not present

## 2023-12-25 DIAGNOSIS — K750 Abscess of liver: Secondary | ICD-10-CM

## 2023-12-25 DIAGNOSIS — I2510 Atherosclerotic heart disease of native coronary artery without angina pectoris: Secondary | ICD-10-CM

## 2023-12-25 DIAGNOSIS — I5022 Chronic systolic (congestive) heart failure: Secondary | ICD-10-CM | POA: Diagnosis not present

## 2023-12-25 DIAGNOSIS — J9601 Acute respiratory failure with hypoxia: Secondary | ICD-10-CM | POA: Diagnosis not present

## 2023-12-25 DIAGNOSIS — J95831 Postprocedural hemorrhage and hematoma of a respiratory system organ or structure following other procedure: Secondary | ICD-10-CM | POA: Diagnosis not present

## 2023-12-25 DIAGNOSIS — I130 Hypertensive heart and chronic kidney disease with heart failure and stage 1 through stage 4 chronic kidney disease, or unspecified chronic kidney disease: Secondary | ICD-10-CM | POA: Diagnosis not present

## 2023-12-25 DIAGNOSIS — D62 Acute posthemorrhagic anemia: Secondary | ICD-10-CM | POA: Diagnosis not present

## 2023-12-25 DIAGNOSIS — N179 Acute kidney failure, unspecified: Secondary | ICD-10-CM | POA: Diagnosis not present

## 2023-12-25 DIAGNOSIS — N183 Chronic kidney disease, stage 3 unspecified: Secondary | ICD-10-CM | POA: Diagnosis not present

## 2023-12-25 DIAGNOSIS — E1140 Type 2 diabetes mellitus with diabetic neuropathy, unspecified: Secondary | ICD-10-CM

## 2023-12-25 DIAGNOSIS — E1122 Type 2 diabetes mellitus with diabetic chronic kidney disease: Secondary | ICD-10-CM | POA: Diagnosis not present

## 2023-12-25 DIAGNOSIS — I480 Paroxysmal atrial fibrillation: Secondary | ICD-10-CM | POA: Diagnosis not present

## 2023-12-26 DIAGNOSIS — D62 Acute posthemorrhagic anemia: Secondary | ICD-10-CM | POA: Diagnosis not present

## 2023-12-26 DIAGNOSIS — E1122 Type 2 diabetes mellitus with diabetic chronic kidney disease: Secondary | ICD-10-CM | POA: Diagnosis not present

## 2023-12-26 DIAGNOSIS — J95831 Postprocedural hemorrhage and hematoma of a respiratory system organ or structure following other procedure: Secondary | ICD-10-CM | POA: Diagnosis not present

## 2023-12-26 DIAGNOSIS — I5022 Chronic systolic (congestive) heart failure: Secondary | ICD-10-CM | POA: Diagnosis not present

## 2023-12-26 DIAGNOSIS — I480 Paroxysmal atrial fibrillation: Secondary | ICD-10-CM | POA: Diagnosis not present

## 2023-12-26 DIAGNOSIS — N179 Acute kidney failure, unspecified: Secondary | ICD-10-CM | POA: Diagnosis not present

## 2023-12-26 DIAGNOSIS — I130 Hypertensive heart and chronic kidney disease with heart failure and stage 1 through stage 4 chronic kidney disease, or unspecified chronic kidney disease: Secondary | ICD-10-CM | POA: Diagnosis not present

## 2023-12-26 DIAGNOSIS — N183 Chronic kidney disease, stage 3 unspecified: Secondary | ICD-10-CM | POA: Diagnosis not present

## 2023-12-26 DIAGNOSIS — J9601 Acute respiratory failure with hypoxia: Secondary | ICD-10-CM | POA: Diagnosis not present

## 2023-12-27 DIAGNOSIS — J95831 Postprocedural hemorrhage and hematoma of a respiratory system organ or structure following other procedure: Secondary | ICD-10-CM | POA: Diagnosis not present

## 2023-12-27 DIAGNOSIS — I5022 Chronic systolic (congestive) heart failure: Secondary | ICD-10-CM | POA: Diagnosis not present

## 2023-12-27 DIAGNOSIS — E1122 Type 2 diabetes mellitus with diabetic chronic kidney disease: Secondary | ICD-10-CM | POA: Diagnosis not present

## 2023-12-27 DIAGNOSIS — I480 Paroxysmal atrial fibrillation: Secondary | ICD-10-CM | POA: Diagnosis not present

## 2023-12-27 DIAGNOSIS — I130 Hypertensive heart and chronic kidney disease with heart failure and stage 1 through stage 4 chronic kidney disease, or unspecified chronic kidney disease: Secondary | ICD-10-CM | POA: Diagnosis not present

## 2023-12-27 DIAGNOSIS — N179 Acute kidney failure, unspecified: Secondary | ICD-10-CM | POA: Diagnosis not present

## 2023-12-27 DIAGNOSIS — N183 Chronic kidney disease, stage 3 unspecified: Secondary | ICD-10-CM | POA: Diagnosis not present

## 2023-12-27 DIAGNOSIS — D62 Acute posthemorrhagic anemia: Secondary | ICD-10-CM | POA: Diagnosis not present

## 2023-12-27 DIAGNOSIS — J9601 Acute respiratory failure with hypoxia: Secondary | ICD-10-CM | POA: Diagnosis not present

## 2023-12-31 ENCOUNTER — Encounter: Payer: Self-pay | Admitting: Family Medicine

## 2023-12-31 ENCOUNTER — Ambulatory Visit: Admitting: Family Medicine

## 2023-12-31 VITALS — BP 124/64 | HR 40 | Temp 98.0°F | Ht 70.0 in | Wt 220.2 lb

## 2023-12-31 DIAGNOSIS — K5900 Constipation, unspecified: Secondary | ICD-10-CM

## 2023-12-31 DIAGNOSIS — I5022 Chronic systolic (congestive) heart failure: Secondary | ICD-10-CM

## 2023-12-31 DIAGNOSIS — E43 Unspecified severe protein-calorie malnutrition: Secondary | ICD-10-CM | POA: Diagnosis not present

## 2023-12-31 DIAGNOSIS — R531 Weakness: Secondary | ICD-10-CM

## 2023-12-31 DIAGNOSIS — J90 Pleural effusion, not elsewhere classified: Secondary | ICD-10-CM | POA: Diagnosis not present

## 2023-12-31 DIAGNOSIS — E1122 Type 2 diabetes mellitus with diabetic chronic kidney disease: Secondary | ICD-10-CM

## 2023-12-31 DIAGNOSIS — I480 Paroxysmal atrial fibrillation: Secondary | ICD-10-CM

## 2023-12-31 DIAGNOSIS — N183 Chronic kidney disease, stage 3 unspecified: Secondary | ICD-10-CM | POA: Diagnosis not present

## 2023-12-31 DIAGNOSIS — R627 Adult failure to thrive: Secondary | ICD-10-CM

## 2023-12-31 DIAGNOSIS — D649 Anemia, unspecified: Secondary | ICD-10-CM | POA: Diagnosis not present

## 2023-12-31 LAB — RENAL FUNCTION PANEL
Albumin: 3 g/dL — ABNORMAL LOW (ref 3.5–5.2)
BUN: 28 mg/dL — ABNORMAL HIGH (ref 6–23)
CO2: 33 meq/L — ABNORMAL HIGH (ref 19–32)
Calcium: 8.8 mg/dL (ref 8.4–10.5)
Chloride: 96 meq/L (ref 96–112)
Creatinine, Ser: 1.11 mg/dL (ref 0.40–1.50)
GFR: 62.27 mL/min (ref >60.00)
Glucose, Bld: 145 mg/dL — ABNORMAL HIGH (ref 70–99)
Phosphorus: 3.8 mg/dL (ref 2.3–4.6)
Potassium: 4.2 meq/L (ref 3.5–5.1)
Sodium: 137 meq/L (ref 135–145)

## 2023-12-31 LAB — CBC WITH DIFFERENTIAL/PLATELET
Basophils Absolute: 0 10*3/uL (ref 0.0–0.1)
Basophils Relative: 0.4 % (ref 0.0–3.0)
Eosinophils Absolute: 0.1 10*3/uL (ref 0.0–0.7)
Eosinophils Relative: 0.9 % (ref 0.0–5.0)
HCT: 27.3 % — ABNORMAL LOW (ref 39.0–52.0)
Hemoglobin: 8.6 g/dL — ABNORMAL LOW (ref 13.0–17.0)
Lymphocytes Relative: 22.7 % (ref 12.0–46.0)
Lymphs Abs: 1.7 10*3/uL (ref 0.7–4.0)
MCHC: 31.5 g/dL (ref 30.0–36.0)
MCV: 82.2 fl (ref 78.0–100.0)
Monocytes Absolute: 0.5 10*3/uL (ref 0.1–1.0)
Monocytes Relative: 6.9 % (ref 3.0–12.0)
Neutro Abs: 5.3 10*3/uL (ref 1.4–7.7)
Neutrophils Relative %: 69.1 % (ref 43.0–77.0)
Platelets: 409 10*3/uL — ABNORMAL HIGH (ref 150.0–400.0)
RBC: 3.33 Mil/uL — ABNORMAL LOW (ref 4.22–5.81)
RDW: 18.6 % — ABNORMAL HIGH (ref 11.5–15.5)
WBC: 7.6 10*3/uL (ref 4.0–10.5)

## 2023-12-31 MED ORDER — MIRTAZAPINE 30 MG PO TABS: 30.0000 mg | ORAL_TABLET | Freq: Every day | ORAL | 1 refills | Status: DC

## 2023-12-31 NOTE — Patient Instructions (Addendum)
 Labs today Continue current medicines.  Keep upcoming appointments with lung and heart doctors Increase mirtazapine  (remeron ) to 30mg  nightly.

## 2023-12-31 NOTE — Assessment & Plan Note (Signed)
 Established with pulm Dr Jaclynn Mast with upcoming appt later this week with rpt CXR planned.

## 2023-12-31 NOTE — Assessment & Plan Note (Addendum)
 Update renal panel  on current regimen including spironolactone.

## 2023-12-31 NOTE — Assessment & Plan Note (Addendum)
 Increase mirtazapine  to 30mg  nighlty.  They declined palliative care evaluation at this time, desire to continue [facility] - aware one is not exclusive of the other. Wife has # in case she decides to seek eval.

## 2023-12-31 NOTE — Progress Notes (Signed)
 Ph: [phone] Fax: [phone]   Patient ID: [name] Young., male    DOB: [birth year], 83 y.o.   MRN: [id number]  This visit was conducted in person.  BP 124/64   Pulse (!) 40   Temp 98 F (36.7 C) (Oral)   Ht 5\' 10"  (1.778 m)   Wt 220 lb 4 oz (99.9 kg)   SpO2 96%   BMI 31.60 kg/m    CC: 2 wk f/u visit  Subjective:   HPI: [name] Young. is a 83 y.o. male presenting on 12/31/2023 for Medical Management of Chronic Issues (Here for 2 wk PAF f/u. Has pulmonary appt 01/03/24 and cards appt 01/09/24. Pt accompanied by wife, Bynum Cassis. )   See prior notes for details.  Last visit we dropped metoprolol  to 12.5mg  bid due to bradycardia  H/o afib, eliquis  on hold after acute blood loss anemia due to hemothorax after thoracentesis for recurrent pleural effusion in setting of CHF.   Saw pulm Dr Jaclynn Mast 12/19/2023.  Cardiac rehab referral pending.  Started on torsemide 20mg  daily in place of furosemide , lisinopril  restarted at 2.5mg  daily.   Notes ongoing fluid accumulation to soft tissue of R lateral lower abdomen. Breathing is some better.  Bad taste in mouth continues - but he notes some improvement.   Last visit referred to palliative care - family declined palliative care as he continues receiving home health. They have contact info.      Relevant past medical, surgical, family and social history reviewed and updated as indicated. Interim medical history since our last visit reviewed. Allergies and medications reviewed and updated. Outpatient Medications Prior to Visit  Medication Sig Dispense Refill   aspirin  EC 81 MG tablet Take 81 mg by mouth daily. Swallow whole.     Cholecalciferol  (VITAMIN D3) 25 MCG (1000 UT) CAPS Take 1 capsule (1,000 Units total) by mouth daily. 30 capsule    Cyanocobalamin  (B-12) 1000 MCG CAPS Take 1 capsule by mouth every Monday, Wednesday, and Friday.     docusate sodium  (COLACE) 100 MG capsule Take 1 capsule (100 mg total) by mouth  2 (two) times daily as needed for mild constipation.     feeding supplement (ENSURE ENLIVE / ENSURE PLUS) LIQD Take 237 mLs by mouth 2 (two) times daily between meals. 14220 mL 0   glucose blood (TRUE METRIX BLOOD GLUCOSE TEST) test strip Use as instructed to check blood sugar once a day 100 each 3   lisinopril  (ZESTRIL ) 2.5 MG tablet Take by mouth.     Magnesium  250 MG TABS Take 250 mg by mouth daily.     metoprolol  tartrate (LOPRESSOR ) 25 MG tablet Take 0.5 tablets (12.5 mg total) by mouth 2 (two) times daily.     Multiple Vitamins-Minerals (PRESERVISION AREDS 2 PO) Take 1 capsule by mouth daily.     nitroGLYCERIN  (NITROSTAT ) 0.4 MG SL tablet Place 1 tablet (0.4 mg total) under the tongue every 5 (five) minutes as needed for chest pain. 25 tablet 4   oxyCODONE  (OXY IR/ROXICODONE ) 5 MG immediate release tablet Take 1 tablet (5 mg total) by mouth every 4 (four) hours as needed for moderate pain (pain score 4-6). 30 tablet 0   polyethylene glycol powder (GLYCOLAX /MIRALAX ) 17 GM/SCOOP powder Take 8.5-17 g by mouth daily. 3350 g 1   rosuvastatin  (CRESTOR ) 10 MG tablet TAKE 1 TABLET BY MOUTH EVERY DAY 90 tablet 2   spironolactone (ALDACTONE) 25 MG tablet Take by mouth.     tamsulosin  (  FLOMAX ) 0.4 MG CAPS capsule Take 1 capsule (0.4 mg total) by mouth daily. 90 capsule 4   torsemide  (DEMADEX ) 20 MG tablet Take by mouth.     mirtazapine  (REMERON ) 15 MG tablet Take 1 tablet (15 mg total) by mouth at bedtime. 90 tablet 4   ELIQUIS  5 MG TABS tablet TAKE 1 TABLET BY MOUTH TWICE A DAY (Patient not taking: Reported on 12/10/2023) 60 tablet 5   furosemide  (LASIX ) 40 MG tablet Take 1 tablet (40 mg total) by mouth daily. 30 tablet 11   No facility-administered medications prior to visit.     Per HPI unless specifically indicated in ROS section below Review of Systems  Objective:  BP 124/64   Pulse (!) 40   Temp 98 F (36.7 C) (Oral)   Ht 5\' 10"  (1.778 m)   Wt 220 lb 4 oz (99.9 kg)   SpO2 96%   BMI  31.60 kg/m   Wt Readings from Last 3 Encounters:  12/31/23 220 lb 4 oz (99.9 kg)  12/10/23 223 lb 4 oz (101.3 kg)  12/06/23 223 lb 12.8 oz (101.5 kg)      Physical Exam Vitals and nursing note reviewed.  Constitutional:      Appearance: Normal appearance.     Comments: Sitting in wheelchair  HENT:     Mouth/Throat:     Mouth: Mucous membranes are moist.     Pharynx: Oropharynx is clear. No oropharyngeal exudate or posterior oropharyngeal erythema.  Cardiovascular:     Rate and Rhythm: Normal rate. Rhythm irregular. Occasional Extrasystoles are present.    Pulses: Normal pulses.     Heart sounds: Murmur heard.  Pulmonary:     Effort: Pulmonary effort is normal. No respiratory distress.     Breath sounds: No wheezing, rhonchi or rales.     Comments: Mildly diminished breath sounds to RLL Musculoskeletal:     Right lower leg: Edema (tr) present.     Left lower leg: Edema (tr) present.  Skin:    General: Skin is warm and dry.     Findings: No rash.  Neurological:     Mental Status: He is alert.  Psychiatric:        Mood and Affect: Mood normal.        Behavior: Behavior normal.       Results for orders placed or performed in visit on 12/06/23  CBC   Collection Time: 12/06/23  3:29 PM  Result Value Ref Range   WBC 10.2 3.4 - 10.8 x10E3/uL   RBC 3.44 (L) 4.14 - 5.80 x10E6/uL   Hemoglobin 8.8 (L) 13.0 - 17.7 g/dL   Hematocrit 16.1 (L) 09.6 - 51.0 %   MCV 86 79 - 97 fL   MCH 25.6 (L) 26.6 - 33.0 pg   MCHC 29.7 (L) 31.5 - 35.7 g/dL   RDW 04.5 (H) 40.9 - 81.1 %   Platelets 532 (H) 150 - 450 x10E3/uL  Basic Metabolic Panel (BMET)   Collection Time: 12/06/23  3:29 PM  Result Value Ref Range   Glucose 144 (H) 70 - 99 mg/dL   BUN 40 (H) 8 - 27 mg/dL   Creatinine, Ser 9.14 0.76 - 1.27 mg/dL   eGFR 77 >78 GN/FAO/1.30   BUN/Creatinine Ratio 41 (H) 10 - 24   Sodium 138 134 - 144 mmol/L   Potassium 4.0 3.5 - 5.2 mmol/L   Chloride 92 (L) 96 - 106 mmol/L   CO2 30 (H) 20 - 29  mmol/L  Calcium  8.6 8.6 - 10.2 mg/dL    Assessment & Plan:   Problem List Items Addressed This Visit     CKD stage 3 due to type 2 diabetes mellitus (HCC)   Update renal panel  on current regimen including spironolactone.      Relevant Medications   lisinopril  (ZESTRIL ) 2.5 MG tablet   Other Relevant Orders   Renal function panel   FTT (failure to thrive) in adult   Increase mirtazapine  to 30mg  nighlty.  They declined palliative care evaluation at this time, desire to continue [facility] - aware one is not exclusive of the other. Wife has # in case she decides to seek eval.      Relevant Orders   CBC with Differential/Platelet   Renal function panel   Normocytic anemia   Update CBC.       Relevant Orders   CBC with Differential/Platelet   Constipation   Stable period with PRN miralax  at night       PAF (paroxysmal atrial fibrillation) (HCC)   Eliquis  remains on hold.  Upcoming cardiology appt 01/09/2024.  Update CBC as per above.       Relevant Medications   lisinopril  (ZESTRIL ) 2.5 MG tablet   spironolactone (ALDACTONE) 25 MG tablet   torsemide (DEMADEX) 20 MG tablet   Recurrent pleural effusion on right - Primary   Established with pulm Dr Jaclynn Mast with upcoming appt later this week with rpt CXR planned.       Protein-calorie malnutrition, severe (HCC)   Update labs  Increase mirtazapine  dose      Chronic HFrEF (heart failure with reduced ejection fraction) (HCC)   Seems to be doing better on torsemide 20mg  daily, 3 lb weight loss since started.  Breathing status is stable.       Relevant Medications   lisinopril  (ZESTRIL ) 2.5 MG tablet   spironolactone (ALDACTONE) 25 MG tablet   torsemide (DEMADEX) 20 MG tablet   General weakness     Meds ordered this encounter  Medications   mirtazapine  (REMERON ) 30 MG tablet    Sig: Take 1 tablet (30 mg total) by mouth at bedtime.    Dispense:  90 tablet    Refill:  1    Note new dose    Orders Placed This  Encounter  Procedures   CBC with Differential/Platelet   Renal function panel    Patient Instructions  Labs today Continue current medicines.  Keep upcoming appointments with lung and heart doctors Increase mirtazapine  (remeron ) to 30mg  nightly.   Follow up plan: Return in about 1 month (around 01/30/2024) for follow up visit.  Claire Crick, MD

## 2023-12-31 NOTE — Assessment & Plan Note (Signed)
 Update labs  Increase mirtazapine  dose

## 2023-12-31 NOTE — Assessment & Plan Note (Signed)
 Stable period with PRN miralax  at night

## 2023-12-31 NOTE — Assessment & Plan Note (Signed)
 Seems to be doing better on torsemide 20mg  daily, 3 lb weight loss since started.  Breathing status is stable.

## 2023-12-31 NOTE — Assessment & Plan Note (Signed)
 Eliquis  remains on hold.  Upcoming cardiology appt 01/09/2024.  Update CBC as per above.

## 2023-12-31 NOTE — Assessment & Plan Note (Signed)
 Update CBC.

## 2024-01-01 ENCOUNTER — Encounter: Payer: Self-pay | Admitting: Family Medicine

## 2024-01-01 DIAGNOSIS — J95831 Postprocedural hemorrhage and hematoma of a respiratory system organ or structure following other procedure: Secondary | ICD-10-CM | POA: Diagnosis not present

## 2024-01-01 DIAGNOSIS — J9601 Acute respiratory failure with hypoxia: Secondary | ICD-10-CM | POA: Diagnosis not present

## 2024-01-01 DIAGNOSIS — N183 Chronic kidney disease, stage 3 unspecified: Secondary | ICD-10-CM | POA: Diagnosis not present

## 2024-01-01 DIAGNOSIS — E1122 Type 2 diabetes mellitus with diabetic chronic kidney disease: Secondary | ICD-10-CM | POA: Diagnosis not present

## 2024-01-01 DIAGNOSIS — I480 Paroxysmal atrial fibrillation: Secondary | ICD-10-CM | POA: Diagnosis not present

## 2024-01-01 DIAGNOSIS — D62 Acute posthemorrhagic anemia: Secondary | ICD-10-CM | POA: Diagnosis not present

## 2024-01-01 DIAGNOSIS — I5022 Chronic systolic (congestive) heart failure: Secondary | ICD-10-CM | POA: Diagnosis not present

## 2024-01-01 DIAGNOSIS — N179 Acute kidney failure, unspecified: Secondary | ICD-10-CM | POA: Diagnosis not present

## 2024-01-01 DIAGNOSIS — I130 Hypertensive heart and chronic kidney disease with heart failure and stage 1 through stage 4 chronic kidney disease, or unspecified chronic kidney disease: Secondary | ICD-10-CM | POA: Diagnosis not present

## 2024-01-02 DIAGNOSIS — N183 Chronic kidney disease, stage 3 unspecified: Secondary | ICD-10-CM | POA: Diagnosis not present

## 2024-01-02 DIAGNOSIS — I5022 Chronic systolic (congestive) heart failure: Secondary | ICD-10-CM | POA: Diagnosis not present

## 2024-01-02 DIAGNOSIS — J95831 Postprocedural hemorrhage and hematoma of a respiratory system organ or structure following other procedure: Secondary | ICD-10-CM | POA: Diagnosis not present

## 2024-01-02 DIAGNOSIS — E1122 Type 2 diabetes mellitus with diabetic chronic kidney disease: Secondary | ICD-10-CM | POA: Diagnosis not present

## 2024-01-02 DIAGNOSIS — N179 Acute kidney failure, unspecified: Secondary | ICD-10-CM | POA: Diagnosis not present

## 2024-01-02 DIAGNOSIS — I480 Paroxysmal atrial fibrillation: Secondary | ICD-10-CM | POA: Diagnosis not present

## 2024-01-02 DIAGNOSIS — J9601 Acute respiratory failure with hypoxia: Secondary | ICD-10-CM | POA: Diagnosis not present

## 2024-01-02 DIAGNOSIS — D62 Acute posthemorrhagic anemia: Secondary | ICD-10-CM | POA: Diagnosis not present

## 2024-01-02 DIAGNOSIS — I130 Hypertensive heart and chronic kidney disease with heart failure and stage 1 through stage 4 chronic kidney disease, or unspecified chronic kidney disease: Secondary | ICD-10-CM | POA: Diagnosis not present

## 2024-01-03 DIAGNOSIS — J90 Pleural effusion, not elsewhere classified: Secondary | ICD-10-CM | POA: Diagnosis not present

## 2024-01-03 DIAGNOSIS — R0602 Shortness of breath: Secondary | ICD-10-CM | POA: Diagnosis not present

## 2024-01-03 DIAGNOSIS — I959 Hypotension, unspecified: Secondary | ICD-10-CM | POA: Diagnosis not present

## 2024-01-08 DIAGNOSIS — J9601 Acute respiratory failure with hypoxia: Secondary | ICD-10-CM | POA: Diagnosis not present

## 2024-01-08 DIAGNOSIS — D62 Acute posthemorrhagic anemia: Secondary | ICD-10-CM | POA: Diagnosis not present

## 2024-01-08 DIAGNOSIS — N183 Chronic kidney disease, stage 3 unspecified: Secondary | ICD-10-CM | POA: Diagnosis not present

## 2024-01-08 DIAGNOSIS — J95831 Postprocedural hemorrhage and hematoma of a respiratory system organ or structure following other procedure: Secondary | ICD-10-CM | POA: Diagnosis not present

## 2024-01-08 DIAGNOSIS — I480 Paroxysmal atrial fibrillation: Secondary | ICD-10-CM | POA: Diagnosis not present

## 2024-01-08 DIAGNOSIS — I5022 Chronic systolic (congestive) heart failure: Secondary | ICD-10-CM | POA: Diagnosis not present

## 2024-01-08 DIAGNOSIS — I130 Hypertensive heart and chronic kidney disease with heart failure and stage 1 through stage 4 chronic kidney disease, or unspecified chronic kidney disease: Secondary | ICD-10-CM | POA: Diagnosis not present

## 2024-01-08 DIAGNOSIS — E1122 Type 2 diabetes mellitus with diabetic chronic kidney disease: Secondary | ICD-10-CM | POA: Diagnosis not present

## 2024-01-08 DIAGNOSIS — N179 Acute kidney failure, unspecified: Secondary | ICD-10-CM | POA: Diagnosis not present

## 2024-01-09 ENCOUNTER — Encounter: Payer: Self-pay | Admitting: Internal Medicine

## 2024-01-09 ENCOUNTER — Ambulatory Visit: Attending: Internal Medicine | Admitting: Internal Medicine

## 2024-01-09 VITALS — BP 110/52 | HR 86 | Ht 70.0 in | Wt 219.4 lb

## 2024-01-09 DIAGNOSIS — D649 Anemia, unspecified: Secondary | ICD-10-CM

## 2024-01-09 DIAGNOSIS — N183 Chronic kidney disease, stage 3 unspecified: Secondary | ICD-10-CM | POA: Diagnosis not present

## 2024-01-09 DIAGNOSIS — N179 Acute kidney failure, unspecified: Secondary | ICD-10-CM | POA: Diagnosis not present

## 2024-01-09 DIAGNOSIS — I130 Hypertensive heart and chronic kidney disease with heart failure and stage 1 through stage 4 chronic kidney disease, or unspecified chronic kidney disease: Secondary | ICD-10-CM | POA: Diagnosis not present

## 2024-01-09 DIAGNOSIS — E785 Hyperlipidemia, unspecified: Secondary | ICD-10-CM | POA: Diagnosis not present

## 2024-01-09 DIAGNOSIS — E1122 Type 2 diabetes mellitus with diabetic chronic kidney disease: Secondary | ICD-10-CM | POA: Diagnosis not present

## 2024-01-09 DIAGNOSIS — I480 Paroxysmal atrial fibrillation: Secondary | ICD-10-CM | POA: Diagnosis not present

## 2024-01-09 DIAGNOSIS — D62 Acute posthemorrhagic anemia: Secondary | ICD-10-CM | POA: Diagnosis not present

## 2024-01-09 DIAGNOSIS — E1169 Type 2 diabetes mellitus with other specified complication: Secondary | ICD-10-CM | POA: Diagnosis not present

## 2024-01-09 DIAGNOSIS — I5032 Chronic diastolic (congestive) heart failure: Secondary | ICD-10-CM | POA: Diagnosis not present

## 2024-01-09 DIAGNOSIS — I5022 Chronic systolic (congestive) heart failure: Secondary | ICD-10-CM | POA: Diagnosis not present

## 2024-01-09 DIAGNOSIS — I2510 Atherosclerotic heart disease of native coronary artery without angina pectoris: Secondary | ICD-10-CM

## 2024-01-09 DIAGNOSIS — J9601 Acute respiratory failure with hypoxia: Secondary | ICD-10-CM | POA: Diagnosis not present

## 2024-01-09 DIAGNOSIS — J95831 Postprocedural hemorrhage and hematoma of a respiratory system organ or structure following other procedure: Secondary | ICD-10-CM | POA: Diagnosis not present

## 2024-01-09 MED ORDER — FERROUS SULFATE 325 (65 FE) MG PO TBEC: 325.0000 mg | DELAYED_RELEASE_TABLET | Freq: Every day | ORAL | 1 refills | Status: DC

## 2024-01-09 NOTE — Progress Notes (Signed)
 Cardiology Office Note:  .   Date:  01/09/2024  ID:  [name] Young., DOB 20-Jul-1941, MRN [id number] PCP: Claire Crick, MD   HeartCare Providers Cardiologist:  Sammy Crisp, MD     History of Present Illness: .   [name] Young. is a 83 y.o. male with history of three-vessel CAD status post CABG (01/2018) complicated by postoperative atrial fibrillation (transient atrial fibrillation recurred in the setting of sepsis related to ascending cholangitis), ascending cholangitis complicated by recurrent atrial fibrillation, heart failure with recovered ejection fraction (LVEF 40-45% -> 50-55%), and mitral valve endocarditis, as well as hypertension, hyperlipidemia, type 2 diabetes mellitus, hepatic abscess, and chronic kidney disease stage III, who presents for follow-up of CAD, atrial fibrillation, HFpEF, and recent hospitalization for iatrogenic hemothorax.  I last saw him in early March, at which time he complained of progressive shortness of breath.  We agreed to proceed with thoracentesis already ordered by his pulmonologist and subsequent right and left heart catheterization.  He underwent thoracentesis but did not feel any better; if anything he began feeling more short of breath.  He was placed on the cath table, where fluoroscopy prior to obtaining access showed near complete collapse of the right lung.  The procedure was aborted with subsequent imaging demonstrating a very large right pleural effusion and near complete collapse of the right lung.  Chest tube was placed to evacuate large hemothorax.  He also required PRBC transfusion for acute blood loss anemia.  While in the hospital, he was noted to have frequent episodes of NSVT that improved with increased dose of metoprolol .  LVEF was noted to be down slightly from baseline at 40 to 45%.  He was seen in follow-up in late March by [name] Bene, NP, at which time he continued to complain of shortness of breath with limited  functional capacity though he was better compared to before his hospitalization earlier in the month.  He subsequently has transitioned his pulmonary Young to [name] Young at [facility], having last been seen a week ago.  Today, [name] reports that he is feeling a little bit better over the last 3 days though he is still very weak and gets out of breath easily.  He is also bothered by continued swelling along his right flank.  He denies chest pain, palpitations, and significant lower extremity edema.  He occasionally feels a little lightheaded but has not fallen.  He denies bleeding.  He wishes to move forward with cardiac catheterization.  He remains off apixaban .  ROS: See HPI  Studies Reviewed: [name] Young   EKG Interpretation Date/Time:  Wednesday January 09 2024 10:56:03 EDT Ventricular Rate:  86 PR Interval:  150 QRS Duration:  92 QT Interval:  394 QTC Calculation: 471 R Axis:   2  Text Interpretation: Sinus rhythm with frequent Premature ventricular complexes Nonspecific ST abnormality Abnormal ECG When compared with ECG of 06-Dec-2023 15:22, No significant change was found Confirmed by [name] Young, [name] Young [phone]) on 01/09/2024 11:01:12 AM    Risk Assessment/Calculations:    CHA2DS2-VASc Score = 6   This indicates a 9.7% annual risk of stroke. The patient's score is based upon: CHF History: 1 HTN History: 1 Diabetes History: 1 Stroke History: 0 Vascular Disease History: 1 Age Score: 2 Gender Score: 0            Physical Exam:   VS:  BP (!) 110/52 (BP Location: Left Arm, Patient Position: Sitting, Cuff Size: Normal)   Pulse 86  Ht 5\' 10"  (1.778 m)   Wt 219 lb 6 oz (99.5 kg)   SpO2 97%   BMI 31.48 kg/m    Wt Readings from Last 3 Encounters:  01/09/24 219 lb 6 oz (99.5 kg)  12/31/23 220 lb 4 oz (99.9 kg)  12/10/23 223 lb 4 oz (101.3 kg)    General:  NAD. Neck: No JVD or HJR. Lungs: Diminished breath sounds at the right lung base.  Lungs otherwise clear. Heart: Regular  rate and rhythm with 2/6 systolic murmur.  No rubs or gallops. Abdomen: Soft, nontender, nondistended. Extremities: Trace lower remedy edema bilaterally.  ASSESSMENT AND PLAN: .    Coronary artery disease: No angina reported though question if some of his dyspnea is an anginal equivalent.  Frequent PVCs again noted on EKG today.  Cardiac catheterization previously pursued but aborted after discovering right hemothorax immediately prior to catheterization.  As hemothorax appears to have resolved, I think it is reasonable for us  to move forward with catheterization.  However, I would like for his anemia to improve a bit.  I suspected it is largely iron deficiency driving this after his massive right hemothorax requiring chest tube placement and PRBC transfusion.  I will have him begin taking ferrous sulfate 325 mg daily with repeat CBC in about 2 weeks.  If hemoglobin is starting to increase, we will move forward with catheterization on 01/25/2024 (right and left heart catheterization).  In the meantime, continue aspirin  81 mg daily as well as rosuvastatin  10 mg daily.  Chronic heart failure with improved ejection fraction: [name] Young appears fairly euvolemic though it is somewhat difficult to assess his volume status.  We will move forward with right and left heart catheterization as discussed above.  In the meantime, we will continue his current regimen of low-dose lisinopril , metoprolol  tartrate, spironolactone, and torsemide.  Repeat BMP shortly before catheterization.  Paroxysmal atrial fibrillation: This occurred in the setting of cholangitis/endocarditis without evidence of recent recurrence.  Apixaban  on hold with recent massive hemothorax.  Once hemoglobin begins to improve and catheterization has been completed, we will readdress transitioning from aspirin  back to apixaban .  Hyperlipidemia associated with type 2 diabetes mellitus: Continue rosuvastatin  10 mg daily given excellent LDL and  concern for myalgias at higher doses.  Ongoing management of DM per [name] Young.  Anemia: Likely combination of chronic disease and recent significant blood loss in the setting of iatrogenic hemothorax.  Hemoglobin has been fairly stable as recently as 12/31/2023.  I would like for his hemoglobin to trend up a bit further before we proceed with catheterization.  I have therefore asked [name] Young to begin taking ferrous sulfate 3 to 25 mg daily.  Will repeat a CBC in about 2 weeks to assess his response.    Cardiac Rehabilitation Eligibility Assessment  The patient is NOT ready to start cardiac rehabilitation due to:    Informed Consent   Shared Decision Making/Informed Consent The risks [stroke (1 in 1000), death (1 in 1000), kidney failure [usually temporary] (1 in 500), bleeding (1 in 200), allergic reaction [possibly serious] (1 in 200)], benefits (diagnostic support and management of coronary artery disease) and alternatives of a cardiac catheterization were discussed in detail with [name] Young and he is willing to proceed.     Dispo: Return to clinic 2 weeks after catheterization.  Signed, Sammy Crisp, MD

## 2024-01-09 NOTE — Patient Instructions (Signed)
 Medication Instructions:  Your physician recommends the following medication changes.  START TAKING: Ferrous Sulfate 325 mg by mouth daily  *If you need a refill on your cardiac medications before your next appointment, please call your pharmacy*  Lab Work: Your provider would like for you to return in 2 weeks to have the following labs drawn: (CBC, BMP).   Please go to [facility] [address] ([facility] [address], [place] [address] You do not need an appointment.  They are open from 8 am- 4:30 pm.  Lunch from 1:00 pm- 2:00 pm You will not need to be fasting.   Testing/Procedures:  [facility] DEPT OF [facility] [place] [address] [place] [address] Dept: [phone] Loc: [phone]  Quavon Wacek.  01/09/2024  You are scheduled for a Cardiac Catheterization on Friday, May 16 with Dr. Veryl Gottron End.  1. Please arrive at the [facility] Entrance of [facility], [address], [place] [address] at 8:30 AM (This is 1 hour(s) prior to your procedure time).  Proceed to the Check-In Desk directly inside the entrance.  Procedure Parking: Use the entrance off of the [facility] [address] side of the hospital. Turn right upon entering and follow the driveway to parking that is directly in front of the Heart & Vascular Center. There is no valet parking available at this entrance, however there is an awning directly in front of the Heart & Vascular Center for drop off/ pick up for patients.  Special note: Every effort is made to have your procedure done on time. Please understand that emergencies sometimes delay scheduled procedures.  2. Diet: Do not eat solid foods after midnight.  The patient may have clear liquids until 5am upon the day of the procedure.  3. Labs: You will need to have blood drawn in 2 weeks (BMP, CBC)  Hold Morning of  procedure Spironolactone   4. Medication instructions in preparation for your procedure:   Contrast Allergy: No    On the morning of your procedure, take your Aspirin  81 mg and any morning medicines NOT listed above.  You may use sips of water.  5. Plan to go home the same day, you will only stay overnight if medically necessary. 6. Bring a current list of your medications and current insurance cards. 7. You MUST have a responsible person to drive you home. 8. Someone MUST be with you the first 24 hours after you arrive home or your discharge will be delayed. 9. Please wear clothes that are easy to get on and off and wear slip-on shoes.  Thank you for allowing us  to care for you!   -- [facility]   Follow-Up: At [facility], you and your health needs are our priority.  As part of our continuing mission to provide you with exceptional heart care, our providers are all part of one team.  This team includes your primary Cardiologist (physician) and Advanced Practice Providers or APPs (Physician Assistants and Nurse Practitioners) who all work together to provide you with the care you need, when you need it.  Your next appointment:   2-3 week(s) after Cath on 01/25/24  Provider:   You may see Sammy Crisp, MD or one of the following Advanced Practice Providers on your designated Care Team:   Laneta Pintos, NP Gildardo Labrador, PA-C Varney Gentleman, PA-C Cadence Echo, [place] Ronald Cockayne, NP  Morey Ar, NP    We recommend signing up for the patient portal called "MyChart".  Sign up information is provided on this After Visit Summary.  MyChart is used to connect with patients for Virtual Visits (Telemedicine).  Patients are able to view lab/test results, encounter notes, upcoming appointments, etc.  Non-urgent messages can be sent to your provider as well.   To learn more about what you can do with MyChart, go to [email or web address].

## 2024-01-09 NOTE — H&P (View-Only) (Signed)
 Cardiology Office Note:  .   Date:  01/09/2024  ID:  [name] Jest., DOB 20-Jul-1941, MRN [id number] PCP: Claire Crick, MD   [facility] Providers Cardiologist:  Sammy Crisp, MD     History of Present Illness: .   [name] Mathew. is a 83 y.o. male with history of three-vessel CAD status post CABG (01/2018) complicated by postoperative atrial fibrillation (transient atrial fibrillation recurred in the setting of sepsis related to ascending cholangitis), ascending cholangitis complicated by recurrent atrial fibrillation, heart failure with recovered ejection fraction (LVEF 40-45% -> 50-55%), and mitral valve endocarditis, as well as hypertension, hyperlipidemia, type 2 diabetes mellitus, hepatic abscess, and chronic kidney disease stage III, who presents for follow-up of CAD, atrial fibrillation, HFpEF, and recent hospitalization for iatrogenic hemothorax.  I last saw him in early March, at which time he complained of progressive shortness of breath.  We agreed to proceed with thoracentesis already ordered by his pulmonologist and subsequent right and left heart catheterization.  He underwent thoracentesis but did not feel any better; if anything he began feeling more short of breath.  He was placed on the cath table, where fluoroscopy prior to obtaining access showed near complete collapse of the right lung.  The procedure was aborted with subsequent imaging demonstrating a very large right pleural effusion and near complete collapse of the right lung.  Chest tube was placed to evacuate large hemothorax.  He also required PRBC transfusion for acute blood loss anemia.  While in the hospital, he was noted to have frequent episodes of NSVT that improved with increased dose of metoprolol .  LVEF was noted to be down slightly from baseline at 40 to 45%.  He was seen in follow-up in late March by Odessa Bene, NP, at which time he continued to complain of shortness of breath with limited  functional capacity though he was better compared to before his hospitalization earlier in the month.  He subsequently has transitioned his pulmonary care to Dr. Aleskerov at [facility], having last been seen a week ago.  Today, [name] Young reports that he is feeling a little bit better over the last 3 days though he is still very weak and gets out of breath easily.  He is also bothered by continued swelling along his right flank.  He denies chest pain, palpitations, and significant lower extremity edema.  He occasionally feels a little lightheaded but has not fallen.  He denies bleeding.  He wishes to move forward with cardiac catheterization.  He remains off apixaban .  ROS: See HPI  Studies Reviewed: Aaron Aas   EKG Interpretation Date/Time:  Wednesday January 09 2024 10:56:03 EDT Ventricular Rate:  86 PR Interval:  150 QRS Duration:  92 QT Interval:  394 QTC Calculation: 471 R Axis:   2  Text Interpretation: Sinus rhythm with frequent Premature ventricular complexes Nonspecific ST abnormality Abnormal ECG When compared with ECG of 06-Dec-2023 15:22, No significant change was found Confirmed by Malyk Girouard, Veryl Gottron [phone]) on 01/09/2024 11:01:12 AM    Risk Assessment/Calculations:    CHA2DS2-VASc Score = 6   This indicates a 9.7% annual risk of stroke. The patient's score is based upon: CHF History: 1 HTN History: 1 Diabetes History: 1 Stroke History: 0 Vascular Disease History: 1 Age Score: 2 Gender Score: 0            Physical Exam:   VS:  BP (!) 110/52 (BP Location: Left Arm, Patient Position: Sitting, Cuff Size: Normal)   Pulse 86  Ht 5\' 10"  (1.778 m)   Wt 219 lb 6 oz (99.5 kg)   SpO2 97%   BMI 31.48 kg/m    Wt Readings from Last 3 Encounters:  01/09/24 219 lb 6 oz (99.5 kg)  12/31/23 220 lb 4 oz (99.9 kg)  12/10/23 223 lb 4 oz (101.3 kg)    General:  NAD. Neck: No JVD or HJR. Lungs: Diminished breath sounds at the right lung base.  Lungs otherwise clear. Heart: Regular  rate and rhythm with 2/6 systolic murmur.  No rubs or gallops. Abdomen: Soft, nontender, nondistended. Extremities: Trace lower remedy edema bilaterally.  ASSESSMENT AND PLAN: .    Coronary artery disease: No angina reported though question if some of his dyspnea is an anginal equivalent.  Frequent PVCs again noted on EKG today.  Cardiac catheterization previously pursued but aborted after discovering right hemothorax immediately prior to catheterization.  As hemothorax appears to have resolved, I think it is reasonable for us  to move forward with catheterization.  However, I would like for his anemia to improve a bit.  I suspected it is largely iron deficiency driving this after his massive right hemothorax requiring chest tube placement and PRBC transfusion.  I will have him begin taking ferrous sulfate 325 mg daily with repeat CBC in about 2 weeks.  If hemoglobin is starting to increase, we will move forward with catheterization on 01/25/2024 (right and left heart catheterization).  In the meantime, continue aspirin  81 mg daily as well as rosuvastatin  10 mg daily.  Chronic heart failure with improved ejection fraction: [name] Young appears fairly euvolemic though it is somewhat difficult to assess his volume status.  We will move forward with right and left heart catheterization as discussed above.  In the meantime, we will continue his current regimen of low-dose lisinopril , metoprolol  tartrate, spironolactone, and torsemide.  Repeat BMP shortly before catheterization.  Paroxysmal atrial fibrillation: This occurred in the setting of cholangitis/endocarditis without evidence of recent recurrence.  Apixaban  on hold with recent massive hemothorax.  Once hemoglobin begins to improve and catheterization has been completed, we will readdress transitioning from aspirin  back to apixaban .  Hyperlipidemia associated with type 2 diabetes mellitus: Continue rosuvastatin  10 mg daily given excellent LDL and  concern for myalgias at higher doses.  Ongoing management of DM per Dr. Mariam Shingles.  Anemia: Likely combination of chronic disease and recent significant blood loss in the setting of iatrogenic hemothorax.  Hemoglobin has been fairly stable as recently as 12/31/2023.  I would like for his hemoglobin to trend up a bit further before we proceed with catheterization.  I have therefore asked Mr. Ila Malay to begin taking ferrous sulfate 3 to 25 mg daily.  Will repeat a CBC in about 2 weeks to assess his response.    Cardiac Rehabilitation Eligibility Assessment  The patient is NOT ready to start cardiac rehabilitation due to:    Informed Consent   Shared Decision Making/Informed Consent The risks [stroke (1 in 1000), death (1 in 1000), kidney failure [usually temporary] (1 in 500), bleeding (1 in 200), allergic reaction [possibly serious] (1 in 200)], benefits (diagnostic support and management of coronary artery disease) and alternatives of a cardiac catheterization were discussed in detail with Mr. Bud Care and he is willing to proceed.     Dispo: Return to clinic 2 weeks after catheterization.  Signed, Sammy Crisp, MD

## 2024-01-16 DIAGNOSIS — I480 Paroxysmal atrial fibrillation: Secondary | ICD-10-CM | POA: Diagnosis not present

## 2024-01-16 DIAGNOSIS — I5022 Chronic systolic (congestive) heart failure: Secondary | ICD-10-CM | POA: Diagnosis not present

## 2024-01-16 DIAGNOSIS — I130 Hypertensive heart and chronic kidney disease with heart failure and stage 1 through stage 4 chronic kidney disease, or unspecified chronic kidney disease: Secondary | ICD-10-CM | POA: Diagnosis not present

## 2024-01-16 DIAGNOSIS — J95831 Postprocedural hemorrhage and hematoma of a respiratory system organ or structure following other procedure: Secondary | ICD-10-CM | POA: Diagnosis not present

## 2024-01-16 DIAGNOSIS — J9601 Acute respiratory failure with hypoxia: Secondary | ICD-10-CM | POA: Diagnosis not present

## 2024-01-16 DIAGNOSIS — N179 Acute kidney failure, unspecified: Secondary | ICD-10-CM | POA: Diagnosis not present

## 2024-01-16 DIAGNOSIS — E1122 Type 2 diabetes mellitus with diabetic chronic kidney disease: Secondary | ICD-10-CM | POA: Diagnosis not present

## 2024-01-16 DIAGNOSIS — N183 Chronic kidney disease, stage 3 unspecified: Secondary | ICD-10-CM | POA: Diagnosis not present

## 2024-01-16 DIAGNOSIS — D62 Acute posthemorrhagic anemia: Secondary | ICD-10-CM | POA: Diagnosis not present

## 2024-01-22 DIAGNOSIS — I5022 Chronic systolic (congestive) heart failure: Secondary | ICD-10-CM | POA: Diagnosis not present

## 2024-01-23 ENCOUNTER — Ambulatory Visit: Payer: Self-pay | Admitting: Internal Medicine

## 2024-01-23 LAB — BASIC METABOLIC PANEL WITH GFR
BUN/Creatinine Ratio: 25 — ABNORMAL HIGH (ref 10–24)
BUN: 35 mg/dL — ABNORMAL HIGH (ref 8–27)
CO2: 24 mmol/L (ref 20–29)
Calcium: 8.8 mg/dL (ref 8.6–10.2)
Chloride: 103 mmol/L (ref 96–106)
Creatinine, Ser: 1.39 mg/dL — ABNORMAL HIGH (ref 0.76–1.27)
Glucose: 189 mg/dL — ABNORMAL HIGH (ref 70–99)
Potassium: 5.6 mmol/L — ABNORMAL HIGH (ref 3.5–5.2)
Sodium: 147 mmol/L — ABNORMAL HIGH (ref 134–144)
eGFR: 51 mL/min/{1.73_m2} — ABNORMAL LOW (ref >59)

## 2024-01-23 LAB — CBC
Hematocrit: 30.4 % — ABNORMAL LOW (ref 37.5–51.0)
Hemoglobin: 8.9 g/dL — ABNORMAL LOW (ref 13.0–17.7)
MCH: 24.9 pg — ABNORMAL LOW (ref 26.6–33.0)
MCHC: 29.3 g/dL — ABNORMAL LOW (ref 31.5–35.7)
MCV: 85 fL (ref 79–97)
Platelets: 373 10*3/uL (ref 150–450)
RBC: 3.57 x10E6/uL — ABNORMAL LOW (ref 4.14–5.80)
RDW: 15.5 % — ABNORMAL HIGH (ref 11.6–15.4)
WBC: 8.9 10*3/uL (ref 3.4–10.8)

## 2024-01-24 ENCOUNTER — Telehealth: Payer: Self-pay | Admitting: *Deleted

## 2024-01-24 NOTE — Telephone Encounter (Signed)
 Called and spoke with patients wife per release form. Reviewed instructions, follow up appointment, and arrival time. She verbalized understanding of all information and all questions were answered.

## 2024-01-25 ENCOUNTER — Encounter
Admission: RE | Disposition: A | Discharge status: Home / Self Care | Payer: Self-pay | Source: Home / Self Care | Attending: Internal Medicine

## 2024-01-25 ENCOUNTER — Encounter: Payer: Self-pay | Admitting: Internal Medicine

## 2024-01-25 ENCOUNTER — Ambulatory Visit
Admission: RE | Admit: 2024-01-25 | Discharge: 2024-01-25 | Disposition: A | Discharge status: Home / Self Care | Attending: Internal Medicine | Admitting: Internal Medicine

## 2024-01-25 DIAGNOSIS — I5032 Chronic diastolic (congestive) heart failure: Secondary | ICD-10-CM | POA: Diagnosis not present

## 2024-01-25 DIAGNOSIS — E785 Hyperlipidemia, unspecified: Secondary | ICD-10-CM | POA: Diagnosis not present

## 2024-01-25 DIAGNOSIS — Z79899 Other long term (current) drug therapy: Secondary | ICD-10-CM | POA: Insufficient documentation

## 2024-01-25 DIAGNOSIS — I130 Hypertensive heart and chronic kidney disease with heart failure and stage 1 through stage 4 chronic kidney disease, or unspecified chronic kidney disease: Secondary | ICD-10-CM | POA: Diagnosis not present

## 2024-01-25 DIAGNOSIS — I509 Heart failure, unspecified: Secondary | ICD-10-CM | POA: Diagnosis present

## 2024-01-25 DIAGNOSIS — D631 Anemia in chronic kidney disease: Secondary | ICD-10-CM | POA: Diagnosis not present

## 2024-01-25 DIAGNOSIS — I2510 Atherosclerotic heart disease of native coronary artery without angina pectoris: Secondary | ICD-10-CM | POA: Diagnosis not present

## 2024-01-25 DIAGNOSIS — I2584 Coronary atherosclerosis due to calcified coronary lesion: Secondary | ICD-10-CM | POA: Insufficient documentation

## 2024-01-25 DIAGNOSIS — N183 Chronic kidney disease, stage 3 unspecified: Secondary | ICD-10-CM | POA: Insufficient documentation

## 2024-01-25 DIAGNOSIS — I5022 Chronic systolic (congestive) heart failure: Secondary | ICD-10-CM | POA: Diagnosis present

## 2024-01-25 DIAGNOSIS — Z7982 Long term (current) use of aspirin: Secondary | ICD-10-CM | POA: Diagnosis not present

## 2024-01-25 DIAGNOSIS — I152 Hypertension secondary to endocrine disorders: Secondary | ICD-10-CM | POA: Insufficient documentation

## 2024-01-25 DIAGNOSIS — I480 Paroxysmal atrial fibrillation: Secondary | ICD-10-CM | POA: Insufficient documentation

## 2024-01-25 DIAGNOSIS — E1122 Type 2 diabetes mellitus with diabetic chronic kidney disease: Secondary | ICD-10-CM | POA: Diagnosis not present

## 2024-01-25 DIAGNOSIS — Z951 Presence of aortocoronary bypass graft: Secondary | ICD-10-CM | POA: Diagnosis not present

## 2024-01-25 DIAGNOSIS — I2582 Chronic total occlusion of coronary artery: Secondary | ICD-10-CM | POA: Insufficient documentation

## 2024-01-25 HISTORY — PX: RIGHT/LEFT HEART CATH AND CORONARY/GRAFT ANGIOGRAPHY: CATH118267

## 2024-01-25 LAB — POCT I-STAT 7, (LYTES, BLD GAS, ICA,H+H)
Acid-Base Excess: 3 mmol/L — ABNORMAL HIGH (ref 0.0–2.0)
Bicarbonate: 26.6 mmol/L (ref 20.0–28.0)
Calcium, Ion: 1.15 mmol/L (ref 1.15–1.40)
HCT: 24 % — ABNORMAL LOW (ref 39.0–52.0)
Hemoglobin: 8.2 g/dL — ABNORMAL LOW (ref 13.0–17.0)
O2 Saturation: 99 %
Potassium: 4.5 mmol/L (ref 3.5–5.1)
Sodium: 137 mmol/L (ref 135–145)
TCO2: 28 mmol/L (ref 22–32)
pCO2 arterial: 38.1 mmHg (ref 32–48)
pH, Arterial: 7.453 — ABNORMAL HIGH (ref 7.35–7.45)
pO2, Arterial: 115 mmHg — ABNORMAL HIGH (ref 83–108)

## 2024-01-25 LAB — POCT I-STAT EG7
Acid-Base Excess: 2 mmol/L (ref 0.0–2.0)
Bicarbonate: 27.2 mmol/L (ref 20.0–28.0)
Calcium, Ion: 1.14 mmol/L — ABNORMAL LOW (ref 1.15–1.40)
HCT: 24 % — ABNORMAL LOW (ref 39.0–52.0)
Hemoglobin: 8.2 g/dL — ABNORMAL LOW (ref 13.0–17.0)
O2 Saturation: 59 %
Potassium: 4.4 mmol/L (ref 3.5–5.1)
Sodium: 137 mmol/L (ref 135–145)
TCO2: 28 mmol/L (ref 22–32)
pCO2, Ven: 42.1 mmHg — ABNORMAL LOW (ref 44–60)
pH, Ven: 7.419 (ref 7.25–7.43)
pO2, Ven: 30 mmHg — CL (ref 32–45)

## 2024-01-25 SURGERY — RIGHT/LEFT HEART CATH AND CORONARY/GRAFT ANGIOGRAPHY
Anesthesia: Moderate Sedation | Laterality: Bilateral

## 2024-01-25 MED ORDER — SODIUM CHLORIDE 0.9 % IV SOLN
INTRAVENOUS | Status: DC
Start: 2024-01-25 — End: 2024-01-25

## 2024-01-25 MED ORDER — MIDAZOLAM HCL 2 MG/2ML IJ SOLN
INTRAMUSCULAR | Status: AC
  Filled 2024-01-25: qty 2

## 2024-01-25 MED ORDER — SODIUM CHLORIDE 0.9 % IV SOLN: INTRAVENOUS | Status: DC

## 2024-01-25 MED ORDER — SODIUM CHLORIDE 0.9 % IV SOLN: INTRAVENOUS | Status: DC | PRN

## 2024-01-25 MED ORDER — HEPARIN SODIUM (PORCINE) 1000 UNIT/ML IJ SOLN
INTRAMUSCULAR | Status: DC | PRN
  Administered 2024-01-25: 5000 [IU] via INTRAVENOUS

## 2024-01-25 MED ORDER — HYDRALAZINE HCL 20 MG/ML IJ SOLN: 10.0000 mg | INTRAMUSCULAR | Status: DC | PRN

## 2024-01-25 MED ORDER — VERAPAMIL HCL 2.5 MG/ML IV SOLN
INTRAVENOUS | Status: DC | PRN
  Administered 2024-01-25 (×2): 2.5 mg via INTRA_ARTERIAL

## 2024-01-25 MED ORDER — LIDOCAINE HCL 1 % IJ SOLN
INTRAMUSCULAR | Status: AC
  Filled 2024-01-25: qty 20

## 2024-01-25 MED ORDER — HEPARIN SODIUM (PORCINE) 1000 UNIT/ML IJ SOLN
INTRAMUSCULAR | Status: AC
  Filled 2024-01-25: qty 10

## 2024-01-25 MED ORDER — HEPARIN (PORCINE) IN NACL 1000-0.9 UT/500ML-% IV SOLN
INTRAVENOUS | Status: DC | PRN
  Administered 2024-01-25: 1000 mL

## 2024-01-25 MED ORDER — FENTANYL CITRATE (PF) 100 MCG/2ML IJ SOLN
INTRAMUSCULAR | Status: AC
  Filled 2024-01-25: qty 2

## 2024-01-25 MED ORDER — ACETAMINOPHEN 325 MG PO TABS: 650.0000 mg | ORAL_TABLET | ORAL | Status: DC | PRN

## 2024-01-25 MED ORDER — ONDANSETRON HCL 4 MG/2ML IJ SOLN
4.0000 mg | Freq: Four times a day (QID) | INTRAMUSCULAR | Status: DC | PRN
Start: 2024-01-25 — End: 2024-01-25

## 2024-01-25 MED ORDER — SODIUM CHLORIDE 0.9% FLUSH: 3.0000 mL | INTRAVENOUS | Status: DC | PRN

## 2024-01-25 MED ORDER — ASPIRIN 81 MG PO CHEW: 81.0000 mg | CHEWABLE_TABLET | ORAL | Status: DC

## 2024-01-25 MED ORDER — VERAPAMIL HCL 2.5 MG/ML IV SOLN
INTRAVENOUS | Status: AC
  Filled 2024-01-25: qty 2

## 2024-01-25 MED ORDER — HEPARIN (PORCINE) IN NACL 1000-0.9 UT/500ML-% IV SOLN
INTRAVENOUS | Status: AC
  Filled 2024-01-25: qty 1000

## 2024-01-25 MED ORDER — LABETALOL HCL 5 MG/ML IV SOLN: 10.0000 mg | INTRAVENOUS | Status: DC | PRN

## 2024-01-25 MED ORDER — MIDAZOLAM HCL 2 MG/2ML IJ SOLN
INTRAMUSCULAR | Status: DC | PRN
  Administered 2024-01-25: .5 mg via INTRAVENOUS

## 2024-01-25 MED ORDER — SODIUM CHLORIDE 0.9 % IV SOLN: 250.0000 mL | INTRAVENOUS | Status: DC | PRN

## 2024-01-25 MED ORDER — SODIUM CHLORIDE 0.9% FLUSH: 3.0000 mL | Freq: Two times a day (BID) | INTRAVENOUS | Status: DC

## 2024-01-25 MED ORDER — FENTANYL CITRATE (PF) 100 MCG/2ML IJ SOLN
INTRAMUSCULAR | Status: DC | PRN
  Administered 2024-01-25: 12.5 ug via INTRAVENOUS

## 2024-01-25 MED ORDER — LIDOCAINE HCL (PF) 1 % IJ SOLN
INTRAMUSCULAR | Status: DC | PRN
  Administered 2024-01-25 (×2): 2 mL

## 2024-01-25 MED ORDER — IOHEXOL 300 MG/ML  SOLN
INTRAMUSCULAR | Status: DC | PRN
Start: 2024-01-25 — End: 2024-01-25
  Administered 2024-01-25: 42 mL

## 2024-01-25 MED ORDER — TORSEMIDE 20 MG PO TABS: 20.0000 mg | ORAL_TABLET | ORAL | Status: DC

## 2024-01-25 SURGICAL SUPPLY — 14 items
CATH BALLN WEDGE 5F 110CM (CATHETERS) IMPLANT
CATH INFINITI 5 FR IM (CATHETERS) IMPLANT
CATH INFINITI 5 FR MPA2 (CATHETERS) IMPLANT
CATH INFINITI 5FR JL4 (CATHETERS) IMPLANT
DEVICE RAD TR BAND REGULAR (VASCULAR PRODUCTS) IMPLANT
DRAPE BRACHIAL (DRAPES) IMPLANT
GLIDESHEATH SLEND SS 6F .021 (SHEATH) IMPLANT
GUIDEWIRE .025 260CM (WIRE) IMPLANT
GUIDEWIRE INQWIRE 1.5J.035X260 (WIRE) IMPLANT
KIT ENCORE 26 ADVANTAGE (KITS) IMPLANT
PACK CARDIAC CATH (CUSTOM PROCEDURE TRAY) ×1 IMPLANT
SET ATX-X65L (MISCELLANEOUS) IMPLANT
SHEATH GLIDE SLENDER 4/5FR (SHEATH) IMPLANT
STATION PROTECTION PRESSURIZED (MISCELLANEOUS) IMPLANT

## 2024-01-25 NOTE — Interval H&P Note (Signed)
 History and Physical Interval Note:  01/25/2024 9:50 AM  [place] Jest.  has presented today for surgery, with the diagnosis of chronic CHF w mild reduced EF.  The various methods of treatment have been discussed with the patient and family. After consideration of risks, benefits and other options for treatment, the patient has consented to  Procedure(s): RIGHT/LEFT HEART CATH AND CORONARY/GRAFT ANGIOGRAPHY (Bilateral) as a surgical intervention.  The patient's history has been reviewed, patient examined, no change in status, stable for surgery.  I have reviewed the patient's chart and labs.  Questions were answered to the patient's satisfaction.    Cath Lab Visit (complete for each Cath Lab visit)  Clinical Evaluation Leading to the Procedure:   ACS: No.  Non-ACS:    Anginal/Heart Failure Classification: NYHA class III  Anti-ischemic medical therapy: Minimal Therapy (1 class of medications)  Non-Invasive Test Results: No non-invasive testing performed  Prior CABG: Previous CABG  Delorean Knutzen

## 2024-01-28 ENCOUNTER — Encounter: Payer: Self-pay | Admitting: Internal Medicine

## 2024-02-01 ENCOUNTER — Other Ambulatory Visit: Payer: Self-pay | Admitting: Pulmonary Disease

## 2024-02-07 ENCOUNTER — Ambulatory Visit: Attending: Nurse Practitioner | Admitting: Nurse Practitioner

## 2024-02-07 ENCOUNTER — Encounter: Payer: Self-pay | Admitting: Nurse Practitioner

## 2024-02-07 ENCOUNTER — Ambulatory Visit

## 2024-02-07 VITALS — BP 100/50 | HR 82 | Ht 70.0 in | Wt 216.2 lb

## 2024-02-07 DIAGNOSIS — E1122 Type 2 diabetes mellitus with diabetic chronic kidney disease: Secondary | ICD-10-CM

## 2024-02-07 DIAGNOSIS — J90 Pleural effusion, not elsewhere classified: Secondary | ICD-10-CM

## 2024-02-07 DIAGNOSIS — I493 Ventricular premature depolarization: Secondary | ICD-10-CM

## 2024-02-07 DIAGNOSIS — N183 Chronic kidney disease, stage 3 unspecified: Secondary | ICD-10-CM

## 2024-02-07 DIAGNOSIS — E785 Hyperlipidemia, unspecified: Secondary | ICD-10-CM

## 2024-02-07 DIAGNOSIS — I480 Paroxysmal atrial fibrillation: Secondary | ICD-10-CM | POA: Diagnosis not present

## 2024-02-07 DIAGNOSIS — E1169 Type 2 diabetes mellitus with other specified complication: Secondary | ICD-10-CM | POA: Diagnosis not present

## 2024-02-07 DIAGNOSIS — I5022 Chronic systolic (congestive) heart failure: Secondary | ICD-10-CM | POA: Diagnosis not present

## 2024-02-07 DIAGNOSIS — D649 Anemia, unspecified: Secondary | ICD-10-CM | POA: Diagnosis not present

## 2024-02-07 DIAGNOSIS — I2510 Atherosclerotic heart disease of native coronary artery without angina pectoris: Secondary | ICD-10-CM

## 2024-02-07 DIAGNOSIS — I5020 Unspecified systolic (congestive) heart failure: Secondary | ICD-10-CM

## 2024-02-07 NOTE — Progress Notes (Signed)
 Currently with frequent PVCs and ventricular bigeminy.  Awaiting event monitor to determine burden and if additional therapy is needed.

## 2024-02-07 NOTE — Patient Instructions (Signed)
 Medication Instructions:  No changes at this time.   *If you need a refill on your cardiac medications before your next appointment, please call your pharmacy*  Lab Work: None  If you have labs (blood work) drawn today and your tests are completely normal, you will receive your results only by: MyChart Message (if you have MyChart) OR A paper copy in the mail If you have any lab test that is abnormal or we need to change your treatment, we will call you to review the results.  Testing/Procedures: Your physician has recommended that you wear a Zio monitor.   This monitor is a medical device that records the heart's electrical activity. Doctors most often use these monitors to diagnose arrhythmias. Arrhythmias are problems with the speed or rhythm of the heartbeat. The monitor is a small device applied to your chest. You can wear one while you do your normal daily activities. While wearing this monitor if you have any symptoms to push the button and record what you felt. Once you have worn this monitor for the period of time provider prescribed (Usually 14 days), you will return the monitor device in the postage paid box. Once it is returned they will download the data collected and provide us  with a report which the provider will then review and we will call you with those results. Important tips:  Avoid showering during the first 24 hours of wearing the monitor. Avoid excessive sweating to help maximize wear time. Do not submerge the device, no hot tubs, and no swimming pools. Keep any lotions or oils away from the patch. After 24 hours you may shower with the patch on. Take brief showers with your back facing the shower head.  Do not remove patch once it has been placed because that will interrupt data and decrease adhesive wear time. Push the button when you have any symptoms and write down what you were feeling. Once you have completed wearing your monitor, remove and place into box which  has postage paid and place in your outgoing mailbox.  If for some reason you have misplaced your box then call our office and we can provide another box and/or mail it off for you.     Follow-Up: At [facility], you and your health needs are our priority.  As part of our continuing mission to provide you with exceptional heart care, our providers are all part of one team.  This team includes your primary Cardiologist (physician) and Advanced Practice Providers or APPs (Physician Assistants and Nurse Practitioners) who all work together to provide you with the care you need, when you need it.  Your next appointment:   6 week(s)  Provider:   Sammy Crisp, MD

## 2024-02-07 NOTE — Progress Notes (Signed)
 Office Visit    Patient Name: [name] Young. Date of Encounter: 02/07/2024  Primary Care Provider:  Claire Crick, MD Primary Cardiologist:  Sammy Crisp, MD  Chief Complaint    83 y.o. male with a history of CAD status post three-vessel bypass in May 2019, postop atrial fibrillation, chronic heart failure with midrange ejection fraction, ischemic cardiomyopathy, mitral valve endocarditis, hypertension, hyperlipidemia, type 2 diabetes mellitus, hepatic abscess, stage III chronic kidney disease, right pleural effusion, and recent diagnosis of hemothorax, who presents for f/u after recent diagnostic catheterization.  Past Medical History   Subjective   Past Medical History:  Diagnosis Date   Adenomatous colon polyp    Aortic atherosclerosis (HCC)    Aortic stenosis    a. 11/2023 Echo: mild-mod AS, mean grad .   Arthritis    Ascending aorta dilatation (HCC)    a.) TTE 11/23/2017: asc Ao measured 37 mm; b.) TTE 10/23/2021: Ao root measured 41 mm, asc Ao measured 38 mm; c.) TEE 10/28/2021: asc Ao measured 38 mm; d.) TTE 01/11/2022: Ao root 40 mm, asc Ao 39 mm   Ascending cholangitis 10/2021   BPH (benign prostatic hyperplasia)    CKD (chronic kidney disease), stage III (HCC)    Claustrophobia    Coronary artery disease    a.) LHC 12/21/2017: 75% mLAD, 90% D1, 80-99% OM1/2, CTO pRCA (L-R collaterals) --> CVTS consult. b.) 01/2018 CABG x 3 (LIMA->LAD, VG->D1, VG->RPL); c. 01/2024 Cath: LM nl, LAD 20p, 33m, D1 70ost, 90, LCX 30ost, OM1 80, OM2 99- fills from collats from D1. RCA 100p CTO, RPAV 100 (fills via collats from 3rd septal), VG->RPDA nl, LIMA->LAD nl, VG->D1 nl. RHC PA 35/10 (8), PCWP 10, CO 6.5-->Med Rx.   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   In setting of bacteremia from ascending colangitis   GERD (gastroesophageal reflux disease)    Hemothorax on right 11/2023   HFrEF (heart failure with reduced ejection fraction) (HCC)     a.) TTE 11/23/2017: EF 50-55%, mod MAC, triv TR, G1DD; b.) TTE 10/23/2021: EF 40-45%, mild LAE, Ao sclerosis, triv MR, G2DD; c.) TEE 10/28/2021: EF 40-45%, glob HK, mobile vegitation on MV; d.) TTE 01/11/2022: EF 50-55%, mild LVH, RVE, Ao sclerosis, mild MR/AR, G1DD   History of cholelithiasis    History of kidney stones    ca ox Milon Aloe @ [facility]) now [place]   History of pneumonia    HLD (hyperlipidemia)    HTN (hypertension)    Ischemic cardiomyopathy    a. 12/2022 Echo: EF 50-55%; b. 11/2023 Echo: EF 40-45%, glob HK, mild LVH, GrI DD, nl RV fxn, mild MR, mild AI, mild-mod AS, Ao root 43mm.   Jaundice    age 83   Long term current use of anticoagulant    a.) apixaban    Paroxysmal atrial fibrillation (HCC)    a.) CHA2DS2VASc = 6 (age x 2, HFrEF, HTN, vascular disease history, T2DM);  b.) rate/rhythm maintained on oral metoprolol  succinate; chronically anticoagulated with apixaban    Pleural effusion    a. 11/2023 CT chest: Large R ple effusion, partially loculated-->s/p pleurx.   Pneumonia    Right-sided carotid artery disease (HCC)    a.) carotid doppler 04/05/2022: 1-39% RICA   S/P CABG x 3    a.) LIMA-LAD, SVG-diagonal, SVG-PL branch of RCA   S/P cataract extraction and insertion of intraocular lens    T2DM (type 2 diabetes mellitus) (HCC) 2010   Past Surgical  History:  Procedure Laterality Date   CARPAL TUNNEL RELEASE Bilateral    CATARACT EXTRACTION, BILATERAL     COLONOSCOPY  11/2012   12 adenomatous polyps, diverticulosis, rec rpt 1 yr [facility] Macho)   COLONOSCOPY  12/2013   3 polyps, diverticulosis, rec rpt 3 yrs [facility] Macho)   COLONOSCOPY  06/2019   6 polyps (TA), diverticulosis, f/u left open ended [facility] Macho)   CORONARY ARTERY BYPASS GRAFT N/A 01/18/2018   Procedure: CORONARY ARTERY BYPASS GRAFTING (CABG) x 3; Using Left Internal Mammary Artery, and Right Greater Saphenous Vein harvested Endoscopically, Coronary Artery Endarterectomy;  Surgeon: Heriberto London, MD;  Location: [facility] OR;   Service: Open Heart Surgery;  Laterality: N/A;   ERCP N/A 10/23/2021   Procedure: ENDOSCOPIC RETROGRADE CHOLANGIOPANCREATOGRAPHY (ERCP);  Surgeon: Asencion Blacksmith, MD;  Location: [facility] ENDOSCOPY;  Service: Endoscopy;  Laterality: N/A;   IR CATHETER TUBE CHANGE  01/08/2023   IR PERC PLEURAL DRAIN W/INDWELL CATH W/IMG GUIDE  11/20/2023   IR RADIOLOGIST EVAL & MGMT  01/23/2023   IR THORACENTESIS ASP PLEURAL SPACE W/IMG GUIDE  12/29/2022   KNEE CARTILAGE SURGERY Left    LEFT HEART CATH AND CORONARY ANGIOGRAPHY N/A 12/21/2017   Procedure: LEFT HEART CATH AND CORONARY ANGIOGRAPHY;  Surgeon: Sammy Crisp, MD;  Location: [facility] INVASIVE CV LAB;  Service: Cardiovascular;  Laterality: N/A;   LITHOTRIPSY     REMOVAL OF STONES  10/23/2021   Procedure: REMOVAL OF STONES;  Surgeon: Asencion Blacksmith, MD;  Location: [facility] ENDOSCOPY;  Service: Endoscopy;;   RIGHT/LEFT HEART CATH AND CORONARY/GRAFT ANGIOGRAPHY Bilateral 01/25/2024   Procedure: RIGHT/LEFT HEART CATH AND CORONARY/GRAFT ANGIOGRAPHY;  Surgeon: Sammy Crisp, MD;  Location: [facility] INVASIVE CV LAB;  Service: Cardiovascular;  Laterality: Bilateral;   SPHINCTEROTOMY  10/23/2021   Procedure: SPHINCTEROTOMY;  Surgeon: Asencion Blacksmith, MD;  Location: [facility] ENDOSCOPY;  Service: Endoscopy;;   TEE WITHOUT CARDIOVERSION N/A 01/18/2018   Procedure: TRANSESOPHAGEAL ECHOCARDIOGRAM (TEE);  Surgeon: Matt Song, Donata Fryer, MD;  Location: [facility] OR;  Service: Open Heart Surgery;  Laterality: N/A;   TEE WITHOUT CARDIOVERSION N/A 10/28/2021   Procedure: TRANSESOPHAGEAL ECHOCARDIOGRAM (TEE);  Surgeon: Hazle Lites, MD;  Location: [facility] ENDOSCOPY;  Service: Cardiovascular;  Laterality: N/A;   UMBILICAL HERNIA REPAIR     with mesh    Allergies  No Known Allergies     History of Present Illness      83 y.o. y/o male with a history of CAD status post three-vessel bypass in May 2019, postop atrial fibrillation, chronic heart failure with midrange ejection fraction, ischemic cardiomyopathy,  mitral valve endocarditis, hypertension, hyperlipidemia, type 2 diabetes mellitus, hepatic abscess, stage III chronic kidney disease, right pleural effusion, and right hemothorax (11/2023).  In the setting of recurrent right pleural effusion, Mr. Willemsen has required multiple thoracenteses in late 2024 early 2025.  Following right-sided thoracentesis in early March 2025, he had ongoing dyspnea and developed right-sided pleuritic chest pain.  He presented for planned right and left heart cardiac catheterization on November 20, 2023 with fluoroscopy showed a large right pleural effusion with near complete collapse of the right lung.  Catheterization was canceled.  Chest x-ray showed large right effusion.  Hemoglobin dropped to 7.2 with hematocrit of 23.7 and white count was elevated at 13.5.  He was admitted and received 1 unit of packed red blood cells.  CT of the chest showed a large right pleural effusion which was partially loculated and complete consolidation throughout the right lower lobe with subpleural consolidation in the dependent  right upper and middle lobes.  He was seen by pulmonology underwent placement of a right Pleurx catheter.  During hospitalization he was noted to have frequent runs of nonsustained VT requiring adjustment in beta-blocker.  Echo during hospitalization showed slight reduction in EF to 40-45% with global hypokinesis, grade 1 diastolic dysfunction, mild MR/AI, and mild to moderate aortic stenosis.  He was treated with intravenous Zosyn  out of concern for empyema.  Chest tube was removed March 20 and Mr. Dollins was discharged home.     At office follow-up in December 06, 2023, he continued to have dyspnea on exertion which was slightly better than prior to hospitalization.  He had markedly diminished breath sounds.  Follow-up chest x-ray showed small right pleural effusion and reaction blunting both lateral and posterior costophrenic sulci with minimal right lower lobe residual  infiltrates or atelectasis.  He was referred to pulmonology.  He followed up with Dr. And on April 30, at which time he continued to feel weak with dyspnea on exertion.  Diagnostic catheterization was performed on May 16 showing severe native multivessel disease with new occlusion of the RPA V, patent vein graft to the RPDA, patent LIMA to the LAD, and patent vein graft to the diagonal.  Right heart pressures were normal with recommendation to consider noncardiogenic etiologies of pleural effusion.  Recommendation was also made for outpatient event monitoring to assess PVC burden and this may be contributing to cardiomyopathy and persistent fatigue.  Mr. Sherrin presents today with his wife.  He notes ongoing fatigue and dyspnea on exertion though his wife indicates that he has been very sedentary and is significantly deconditioned.  He is most bothered by somewhat chronic right flank edema.  He says this has been present on an intermittent fashion for the past 2 years and sometimes the skin gets very tight and hurts.  It does not seem to change despite torsemide  dose.  He has not had any lower extremity swelling.  He is currently taking torsemide  4 times a week.  He denies chest pain, palpitations, PND, orthopnea, dizziness, syncope, or early satiety.   Objective   Home Medications    Current Outpatient Medications  Medication Sig Dispense Refill   aspirin  EC 81 MG tablet Take 81 mg by mouth daily. Swallow whole.     bisacodyl  (DULCOLAX) 5 MG EC tablet Take 10 mg by mouth at bedtime.     Cholecalciferol  (VITAMIN D3) 25 MCG (1000 UT) CAPS Take 1 capsule (1,000 Units total) by mouth daily. 30 capsule    Cyanocobalamin  (B-12) 1000 MCG CAPS Take 1 capsule by mouth every Monday, Wednesday, and Friday.     [Paused] ELIQUIS  5 MG TABS tablet TAKE 1 TABLET BY MOUTH TWICE A DAY 60 tablet 5   feeding supplement (ENSURE ENLIVE / ENSURE PLUS) LIQD Take 237 mLs by mouth 2 (two) times daily between meals. (Patient  taking differently: Take 237 mLs by mouth daily.) 14220 mL 0   ferrous sulfate  325 (65 FE) MG EC tablet Take 1 tablet (325 mg total) by mouth daily. 90 tablet 1   glucose blood (TRUE METRIX BLOOD GLUCOSE TEST) test strip Use as instructed to check blood sugar once a day 100 each 3   lisinopril  (ZESTRIL ) 2.5 MG tablet Take 2.5 mg by mouth daily.     Magnesium  250 MG TABS Take 250 mg by mouth daily.     metoprolol  tartrate (LOPRESSOR ) 25 MG tablet Take 0.5 tablets (12.5 mg total) by mouth 2 (two) times  daily.     mirtazapine  (REMERON ) 30 MG tablet Take 1 tablet (30 mg total) by mouth at bedtime. 90 tablet 1   Multiple Vitamins-Minerals (PRESERVISION AREDS 2 PO) Take 1 capsule by mouth daily.     oxyCODONE  (OXY IR/ROXICODONE ) 5 MG immediate release tablet Take 1 tablet (5 mg total) by mouth every 4 (four) hours as needed for moderate pain (pain score 4-6). 30 tablet 0   polyethylene glycol powder (GLYCOLAX /MIRALAX ) 17 GM/SCOOP powder Take 8.5-17 g by mouth daily. (Patient taking differently: Take 8.5-17 g by mouth daily as needed for moderate constipation.) 3350 g 1   rosuvastatin  (CRESTOR ) 10 MG tablet TAKE 1 TABLET BY MOUTH EVERY DAY 90 tablet 2   spironolactone (ALDACTONE) 25 MG tablet Take 25 mg by mouth daily.     tamsulosin  (FLOMAX ) 0.4 MG CAPS capsule Take 1 capsule (0.4 mg total) by mouth daily. 90 capsule 4   torsemide  (DEMADEX ) 20 MG tablet Take 1 tablet (20 mg total) by mouth every other day.     No current facility-administered medications for this visit.     Physical Exam    VS:  BP (!) 100/50 (BP Location: Left Arm, Patient Position: Sitting, Cuff Size: Normal)   Pulse 82   Ht 5\' 10"  (1.778 m)   Wt 216 lb 3.2 oz (98.1 kg)   SpO2 96%   BMI 31.02 kg/m  , BMI Body mass index is 31.02 kg/m.       Cardiac Rehabilitation Eligibility Assessment  The patient is NOT ready to start cardiac rehabilitation due to: Other  Frequent PVCs and bigeminy  GEN: Well nourished, well  developed, in no acute distress. HEENT: normal. Neck: Supple, no JVD, carotid bruits, or masses. Cardiac: RRR, no murmurs, rubs, or gallops. No clubbing, cyanosis, edema.  Radials 2+/PT 2+ and equal bilaterally.  Respiratory:  Respirations regular and unlabored, diminished breath sounds at the right base though markedly improved over last visit. GI: Soft, nontender, nondistended, BS + x 4.  Isolated right flank 1+ pitting edema. MS: no deformity or atrophy. Skin: warm and dry, no rash. Neuro:  Strength and sensation are intact. Psych: Normal affect.  Accessory Clinical Findings    ECG personally reviewed by me today - EKG Interpretation Date/Time:  Thursday Feb 07 2024 10:48:17 EDT Ventricular Rate:  82 PR Interval:  168 QRS Duration:  84 QT Interval:  370 QTC Calculation: 432 R Axis:   1  Text Interpretation: Sinus rhythm with frequent Premature ventricular complexes in a pattern of bigeminy Nonspecific ST and T wave abnormality Confirmed by Laneta Pintos [phone]) on 02/07/2024 10:59:39 AM  - no acute changes.  Lab Results  Component Value Date   WBC 8.9 01/22/2024   HGB 8.2 (L) 01/25/2024   HCT 24.0 (L) 01/25/2024   MCV 85 01/22/2024   PLT 373 01/22/2024   Lab Results  Component Value Date   CREATININE 1.39 (H) 01/22/2024   BUN 35 (H) 01/22/2024   NA 137 01/25/2024   K 4.5 01/25/2024   CL 103 01/22/2024   CO2 24 01/22/2024   Lab Results  Component Value Date   ALT 38 11/20/2023   AST 37 11/20/2023   ALKPHOS 68 11/20/2023   BILITOT 0.6 11/20/2023   Lab Results  Component Value Date   CHOL 132 07/09/2023   HDL 30.00 (L) 07/09/2023   LDLCALC 53 07/09/2023   LDLDIRECT 66.0 06/07/2021   TRIG 244.0 (H) 07/09/2023   CHOLHDL 4 07/09/2023    Lab Results  Component Value Date   HGBA1C 6.9 (A) 11/13/2023   Lab Results  Component Value Date   TSH 4.56 07/09/2023       Assessment & Plan    1.  Chronic heart failure with midrange ejection fraction: Echo in  March 2025 showed reduction in EF to 45 to 45% with global hypokinesis, mild MR/AI, mild to moderate aortic stenosis, grade 1 diastolic dysfunction.  Recent right heart catheterization showed normal filling pressures.  In that setting, heart failure does not appear to be driving recurrent right pleural effusion.  With the exception of what he identifies as chronic right flank edema, he appears euvolemic on examination today.  His weight is down 4 pounds since his cath, neck veins flat, and no lower extremity edema.  He is currently taking torsemide  4 days a week, down from daily, and tolerating well.  He remains on low-dose beta-blocker, ACE inhibitor, and spironolactone.  Pressure remains soft but stable.  2.  Recurrent right pleural effusion/hemothorax: Status post multiple thoracenteses this year with hemothorax requiring chest tube placement in March 2025.  He has been followed closely by pulmonology.  Though he has diminished breath sounds at the right base, compared to prior visits, he is moving air significantly better.  He is pending pulmonary rehab although with frequent PVCs and ventricular bigeminy, he is not an ideal candidate at this point until we sort out that issue.  3.  Frequent PVCs: Ventricular bigeminy today.  Patient denies palpitations though has ongoing fatigue.  Placing 7-day ZIO monitor to assess PVC burden and determine if EP evaluation is necessary.  Continue low-dose beta-blocker.  4.  Paroxysmal atrial fibrillation: Maintaining sinus rhythm with frequent PVCs.  He remains on low-dose beta-blocker.  Eliquis  has been on hold for several months in the setting of hemothorax and anemia.  Hemoglobin 8.2 May 16.  Continue to hold Eliquis  until H&H improves.  He remains on iron therapy.  5.  Coronary artery disease: Status post three-vessel bypass in May 2019 with 3 of 3 patent grafts on catheterization earlier this month.  He has relatively stable, native, severe multivessel disease.   There were no targets for intervention.  He has not been having any chest pain.  He remains on aspirin , beta-blocker, and statin therapy.  6.  History of mitral valve endocarditis: No evidence of recurrence with recent echo showing stable, mild mitral regurgitation in March 2025.  7.  Aortic Stenosis:  Stable, mild-mod AS on recent echo.  8.  CKD III:  Creat 1.39 5/16.  Now on less torsemide .  Cont low-dose acei.  9.  HL:  LDL 53 last year.  Cont statin.  10.  Anemia of chronic dzs:  H/H stable on recent eval.  Eliquis  remains on hold.  11.  Follow-up 7-day event monitor to evaluate PVC burden.  Follow-up in clinic in approximately 4 to 6 weeks or sooner if necessary.  If high PVC burden noted on monitor, we will arrange for EP follow-up prior to next visit.  Laneta Pintos, NP 02/07/2024, 12:40 PM

## 2024-02-08 ENCOUNTER — Encounter: Payer: Self-pay | Admitting: Family Medicine

## 2024-02-08 ENCOUNTER — Ambulatory Visit: Admitting: Family Medicine

## 2024-02-08 VITALS — BP 116/60 | HR 67 | Temp 97.8°F | Ht 70.0 in | Wt 216.4 lb

## 2024-02-08 DIAGNOSIS — J90 Pleural effusion, not elsewhere classified: Secondary | ICD-10-CM

## 2024-02-08 DIAGNOSIS — R627 Adult failure to thrive: Secondary | ICD-10-CM | POA: Diagnosis not present

## 2024-02-08 DIAGNOSIS — I5022 Chronic systolic (congestive) heart failure: Secondary | ICD-10-CM

## 2024-02-08 DIAGNOSIS — W19XXXA Unspecified fall, initial encounter: Secondary | ICD-10-CM | POA: Diagnosis not present

## 2024-02-08 DIAGNOSIS — I480 Paroxysmal atrial fibrillation: Secondary | ICD-10-CM

## 2024-02-08 DIAGNOSIS — I493 Ventricular premature depolarization: Secondary | ICD-10-CM

## 2024-02-08 DIAGNOSIS — I2510 Atherosclerotic heart disease of native coronary artery without angina pectoris: Secondary | ICD-10-CM

## 2024-02-08 DIAGNOSIS — I350 Nonrheumatic aortic (valve) stenosis: Secondary | ICD-10-CM

## 2024-02-08 DIAGNOSIS — R1903 Right lower quadrant abdominal swelling, mass and lump: Secondary | ICD-10-CM

## 2024-02-08 DIAGNOSIS — R0602 Shortness of breath: Secondary | ICD-10-CM | POA: Diagnosis not present

## 2024-02-08 DIAGNOSIS — D649 Anemia, unspecified: Secondary | ICD-10-CM

## 2024-02-08 DIAGNOSIS — Y92009 Unspecified place in unspecified non-institutional (private) residence as the place of occurrence of the external cause: Secondary | ICD-10-CM

## 2024-02-08 NOTE — Patient Instructions (Addendum)
 Continue current medicines.  Encourage protein supplement such as ensure or premiere protein shakes.  Return in 6-8 weeks for follow up visit.

## 2024-02-08 NOTE — Progress Notes (Signed)
 Ph: [phone] Fax: [phone]   Patient ID: [name] Young., male    DOB: 03/16/1941, 83 y.o.   MRN: [id number]  This visit was conducted in person.  BP 116/60   Pulse 67   Temp 97.8 F (36.6 C) (Oral)   Ht 5\' 10"  (1.778 m)   Wt 216 lb 6 oz (98.1 kg)   SpO2 97%   BMI 31.05 kg/m    CC: 1 mo f/u visit  Subjective:   HPI: [name] Young. is a 83 y.o. male presenting on 02/08/2024 for Medical Management of Chronic Issues (Here for 1 mo f/u. Pt accompanied by wife, Bynum Cassis./)   See prior note for details.  Recurrent R pleural effusion s/p repeat thoracentesis, latest complicated by hemothorax with resultant acute blood loss anemia.   Fell 1.5 wks ago - stayed on floor 1 hour. Fell onto floor while trying to pick up a spoon.  Poorly healing wound to L frontal scalp - declines derm evaluation at this time.   Seeing pulm Dr Sharrell Deck as well as cardiology Dr End.  Underwent R/L heart catheterization 01/25/2024 - results as per below.  Saw CHF clinic yesterday - current regimen is torsemide  20mg  every other day, as well as lisinopril  2.5mg  daily, metoprolol  12.5mg  bid, crestor  10mg  daily and spironolactone 25mg  daily.   Most recently started on ferrous sulfate  325mg  daily.  Notes constipation and darker stols - discussed bisacodyl  stool softener and miralax  use.   Notes improvement on current regimen (specifically torsemide  and spironolactone).  Being evaluated for cardiac rehab - deferred due to frequent PVCs with bigeminy.  Pending 7d ZIO monitor to assess PVC burden.   Notes ongoing R lower lateral abdominal pain and swelling that comes and goes.  Notes ongoing lack of taste. Does prefer sweets.  Sleeps in lift chair.   No chest pain, dizziness or palpitations noted. + ongoing dyspnea.   Right and left heart catheterization 01/2024: Conclusions: Severe native coronary artery disease, as detailed below, similar to last catheterization in 2019 other than  interval occlusion of RPAV branch, which is supplied by left-to-right collaterals. Widely patent LIMA-LAD, SVG-D1, and SVG-RPDA. Normal left heart, right heart, and pulmonary artery pressures, as detailed below. Normal Fick cardiac output/index. Recommendations: Continue aggressive secondary prevention of coronary artery disease. Defer resumption of apixaban  pending improvement in hemoglobin in the setting of recent iatrogenic hemothorax.  Continue iron supplementation. Consider noncardiogenic etiologies if right pleural effusion recurs, given normal left and right heart filling pressures and cardiac output. Continue chest lesion and goal-directed medical therapy for heart failure with mildly reduced ejection fraction, as tolerated. Consider outpatient cardiac event monitor to assess PVC burden, as this could be contributing to cardiomyopathy and persistent fatigue.     Relevant past medical, surgical, family and social history reviewed and updated as indicated. Interim medical history since our last visit reviewed. Allergies and medications reviewed and updated. Outpatient Medications Prior to Visit  Medication Sig Dispense Refill   aspirin  EC 81 MG tablet Take 81 mg by mouth daily. Swallow whole.     bisacodyl  (DULCOLAX) 5 MG EC tablet Take 10 mg by mouth at bedtime.     Cholecalciferol  (VITAMIN D3) 25 MCG (1000 UT) CAPS Take 1 capsule (1,000 Units total) by mouth daily. 30 capsule    Cyanocobalamin  (B-12) 1000 MCG CAPS Take 1 capsule by mouth every Monday, Wednesday, and Friday.     feeding supplement (ENSURE ENLIVE / ENSURE PLUS) LIQD Take 237 mLs by  mouth 2 (two) times daily between meals. (Patient taking differently: Take 237 mLs by mouth daily.) 14220 mL 0   ferrous sulfate  325 (65 FE) MG EC tablet Take 1 tablet (325 mg total) by mouth daily. 90 tablet 1   glucose blood (TRUE METRIX BLOOD GLUCOSE TEST) test strip Use as instructed to check blood sugar once a day 100 each 3   lisinopril   (ZESTRIL ) 2.5 MG tablet Take 2.5 mg by mouth daily.     Magnesium  250 MG TABS Take 250 mg by mouth daily.     metoprolol  tartrate (LOPRESSOR ) 25 MG tablet Take 0.5 tablets (12.5 mg total) by mouth 2 (two) times daily.     mirtazapine  (REMERON ) 30 MG tablet Take 1 tablet (30 mg total) by mouth at bedtime. 90 tablet 1   Multiple Vitamins-Minerals (PRESERVISION AREDS 2 PO) Take 1 capsule by mouth daily.     polyethylene glycol powder (GLYCOLAX /MIRALAX ) 17 GM/SCOOP powder Take 8.5-17 g by mouth daily. (Patient taking differently: Take 8.5-17 g by mouth daily as needed for moderate constipation.) 3350 g 1   rosuvastatin  (CRESTOR ) 10 MG tablet TAKE 1 TABLET BY MOUTH EVERY DAY 90 tablet 2   spironolactone (ALDACTONE) 25 MG tablet Take 25 mg by mouth daily.     tamsulosin  (FLOMAX ) 0.4 MG CAPS capsule Take 1 capsule (0.4 mg total) by mouth daily. 90 capsule 4   torsemide  (DEMADEX ) 20 MG tablet Take 1 tablet (20 mg total) by mouth every other day.     ELIQUIS  5 MG TABS tablet TAKE 1 TABLET BY MOUTH TWICE A DAY (Patient not taking: Reported on 02/08/2024) 60 tablet 5   oxyCODONE  (OXY IR/ROXICODONE ) 5 MG immediate release tablet Take 1 tablet (5 mg total) by mouth every 4 (four) hours as needed for moderate pain (pain score 4-6). 30 tablet 0   No facility-administered medications prior to visit.     Per HPI unless specifically indicated in ROS section below Review of Systems  Objective:  BP 116/60   Pulse 67   Temp 97.8 F (36.6 C) (Oral)   Ht 5\' 10"  (1.778 m)   Wt 216 lb 6 oz (98.1 kg)   SpO2 97%   BMI 31.05 kg/m   Wt Readings from Last 3 Encounters:  02/08/24 216 lb 6 oz (98.1 kg)  02/07/24 216 lb 3.2 oz (98.1 kg)  01/25/24 220 lb (99.8 kg)      Physical Exam Vitals and nursing note reviewed.  Constitutional:      Appearance: He is ill-appearing.     Comments: Sitting in wheelchair  HENT:     Mouth/Throat:     Mouth: Mucous membranes are moist.     Pharynx: Oropharynx is clear. No  oropharyngeal exudate or posterior oropharyngeal erythema.  Eyes:     Extraocular Movements: Extraocular movements intact.     Pupils: Pupils are equal, round, and reactive to light.  Cardiovascular:     Rate and Rhythm: Rhythm irregular.     Pulses: Normal pulses.     Heart sounds: No murmur heard. Pulmonary:     Effort: Pulmonary effort is normal. No respiratory distress.     Breath sounds: Normal breath sounds. No wheezing, rhonchi or rales.  Abdominal:     General: Bowel sounds are normal. There is no distension.     Palpations: Abdomen is soft. There is no mass.     Tenderness: There is abdominal tenderness. There is no guarding or rebound.     Hernia: No hernia  is present.     Comments: Indurated skin to right lower abdomen without erythema or warmth  Musculoskeletal:     Right lower leg: No edema.     Left lower leg: No edema.  Skin:    General: Skin is warm and dry.     Findings: No rash.  Neurological:     Mental Status: He is alert.  Psychiatric:        Mood and Affect: Mood normal.        Behavior: Behavior normal.       Lab Results  Component Value Date   NA 137 01/25/2024   CL 103 01/22/2024   K 4.5 01/25/2024   CO2 24 01/22/2024   BUN 35 (H) 01/22/2024   CREATININE 1.39 (H) 01/22/2024   EGFR 51 (L) 01/22/2024   CALCIUM  8.8 01/22/2024   PHOS 3.8 12/31/2023   ALBUMIN  3.0 (L) 12/31/2023   GLUCOSE 189 (H) 01/22/2024    Lab Results  Component Value Date   WBC 8.9 01/22/2024   HGB 8.2 (L) 01/25/2024   HCT 24.0 (L) 01/25/2024   MCV 85 01/22/2024   PLT 373 01/22/2024    Lab Results  Component Value Date   TSH 4.56 07/09/2023    Lab Results  Component Value Date   IRON 65 03/05/2023   TIBC 284.2 03/05/2023   FERRITIN 43.5 03/05/2023   Assessment & Plan:   Problem List Items Addressed This Visit     SOB (shortness of breath)   Recent catheterization showing severe but stable CAD, patent bypass grafts x3, normal pressures. Rec resumption of  eliquis  once anemia stabilized, rec further evaluation of frequent PVCs which could contribution to CHF, consider non-cardiac etiologies for dyspnea.       Coronary artery disease   FTT (failure to thrive) in adult   Ongoing difficulty with this with ongoing anorexia dysgeusia and weight loss.  Continue to encourage protein shake supplementation       Anemia   Latest Hgb remains low at 8.2. Continue to hold eliquis .  Could consider blood transfusion given ongoing dyspnea/fatigue - he will consider.       PAF (paroxysmal atrial fibrillation) (HCC)   Eliquis  remains on hold.       Recurrent pleural effusion on right - Primary   Has established with pulm Jaclynn Mast) - overall stable period with improvement on latest imaging. Thought cardiac etiology however recent cardiac evaluation overall reassuring.       Aortic valve stenosis   H/o this, mild AR and mild-mod AS on latest echo 11/2023      Chronic heart failure with mildly reduced ejection fraction (HFmrEF, 41-49%) (HCC)   Overall improving on current regimen of torsemide  and spironolactone.  Appreciate care of all involved - pulm, cards, CHF clinic.       Frequent PVCs   Pending Zio patch heart monitor to document burden of extrasystoles ?contribution to CHF symptoms with dyspnea.       Fall at home, initial encounter   Recent fall at home. Fortunately no injury noted.       Abdominal right lower quadrant swelling   Presumed related to edema.  Continue torsemide , spironolactone.         No orders of the defined types were placed in this encounter.   No orders of the defined types were placed in this encounter.   Patient Instructions  Continue current medicines.  Encourage protein supplement such as ensure or premiere protein shakes.  Return  in 6-8 weeks for follow up visit.   Follow up plan: Return in about 6 weeks (around 03/21/2024), or if symptoms worsen or fail to improve, for follow up visit.  Claire Crick, MD

## 2024-02-11 DIAGNOSIS — W19XXXA Unspecified fall, initial encounter: Secondary | ICD-10-CM | POA: Insufficient documentation

## 2024-02-11 DIAGNOSIS — R1903 Right lower quadrant abdominal swelling, mass and lump: Secondary | ICD-10-CM | POA: Insufficient documentation

## 2024-02-11 NOTE — Assessment & Plan Note (Signed)
 Latest Hgb remains low at 8.2. Continue to hold eliquis .  Could consider blood transfusion given ongoing dyspnea/fatigue - he will consider.

## 2024-02-11 NOTE — Assessment & Plan Note (Signed)
 Presumed related to edema.  Continue torsemide , spironolactone.

## 2024-02-11 NOTE — Assessment & Plan Note (Signed)
 Recent fall at home. Fortunately no injury noted.

## 2024-02-11 NOTE — Assessment & Plan Note (Signed)
 Overall improving on current regimen of torsemide  and spironolactone.  Appreciate care of all involved - pulm, cards, CHF clinic.

## 2024-02-11 NOTE — Assessment & Plan Note (Addendum)
 Ongoing difficulty with this with ongoing anorexia dysgeusia and weight loss.  Continue to encourage protein shake supplementation

## 2024-02-11 NOTE — Assessment & Plan Note (Signed)
 H/o this, mild AR and mild-mod AS on latest echo 11/2023

## 2024-02-11 NOTE — Assessment & Plan Note (Signed)
 Eliquis  remains on hold.

## 2024-02-11 NOTE — Assessment & Plan Note (Signed)
 Pending Zio patch heart monitor to document burden of extrasystoles ?contribution to CHF symptoms with dyspnea.

## 2024-02-11 NOTE — Assessment & Plan Note (Addendum)
 Recent catheterization showing severe but stable CAD, patent bypass grafts x3, normal pressures. Rec resumption of eliquis  once anemia stabilized, rec further evaluation of frequent PVCs which could contribution to CHF, consider non-cardiac etiologies for dyspnea.

## 2024-02-11 NOTE — Assessment & Plan Note (Addendum)
 Has established with pulm (Aleskerov) - overall stable period with improvement on latest imaging. Thought cardiac etiology however recent cardiac evaluation overall reassuring.

## 2024-02-14 DIAGNOSIS — J9811 Atelectasis: Secondary | ICD-10-CM | POA: Diagnosis not present

## 2024-02-14 DIAGNOSIS — R0609 Other forms of dyspnea: Secondary | ICD-10-CM | POA: Diagnosis not present

## 2024-02-14 DIAGNOSIS — I5032 Chronic diastolic (congestive) heart failure: Secondary | ICD-10-CM | POA: Diagnosis not present

## 2024-02-14 DIAGNOSIS — R627 Adult failure to thrive: Secondary | ICD-10-CM | POA: Diagnosis not present

## 2024-02-14 DIAGNOSIS — J90 Pleural effusion, not elsewhere classified: Secondary | ICD-10-CM | POA: Diagnosis not present

## 2024-02-18 ENCOUNTER — Other Ambulatory Visit: Payer: Self-pay | Admitting: Pulmonary Disease

## 2024-02-18 DIAGNOSIS — R627 Adult failure to thrive: Secondary | ICD-10-CM

## 2024-02-18 DIAGNOSIS — I5032 Chronic diastolic (congestive) heart failure: Secondary | ICD-10-CM

## 2024-02-18 DIAGNOSIS — J90 Pleural effusion, not elsewhere classified: Secondary | ICD-10-CM

## 2024-02-25 ENCOUNTER — Ambulatory Visit
Admission: RE | Admit: 2024-02-25 | Discharge: 2024-02-25 | Disposition: A | Discharge status: Home / Self Care | Source: Ambulatory Visit | Attending: Pulmonary Disease | Admitting: Pulmonary Disease

## 2024-02-25 DIAGNOSIS — R627 Adult failure to thrive: Secondary | ICD-10-CM | POA: Insufficient documentation

## 2024-02-25 DIAGNOSIS — K5730 Diverticulosis of large intestine without perforation or abscess without bleeding: Secondary | ICD-10-CM | POA: Diagnosis not present

## 2024-02-25 DIAGNOSIS — J90 Pleural effusion, not elsewhere classified: Secondary | ICD-10-CM | POA: Diagnosis not present

## 2024-02-25 DIAGNOSIS — N401 Enlarged prostate with lower urinary tract symptoms: Secondary | ICD-10-CM | POA: Diagnosis not present

## 2024-02-25 DIAGNOSIS — I5032 Chronic diastolic (congestive) heart failure: Secondary | ICD-10-CM | POA: Insufficient documentation

## 2024-02-25 DIAGNOSIS — N200 Calculus of kidney: Secondary | ICD-10-CM | POA: Diagnosis not present

## 2024-02-26 DIAGNOSIS — I493 Ventricular premature depolarization: Secondary | ICD-10-CM | POA: Diagnosis not present

## 2024-03-05 ENCOUNTER — Ambulatory Visit: Payer: Self-pay | Admitting: Nurse Practitioner

## 2024-03-05 DIAGNOSIS — I493 Ventricular premature depolarization: Secondary | ICD-10-CM

## 2024-03-05 DIAGNOSIS — I480 Paroxysmal atrial fibrillation: Secondary | ICD-10-CM

## 2024-03-06 ENCOUNTER — Inpatient Hospital Stay
Admission: EM | Admit: 2024-03-06 | Discharge: 2024-03-17 | DRG: 871 | Disposition: A | Discharge status: Skilled Nursing Facility | Attending: Internal Medicine | Admitting: Internal Medicine

## 2024-03-06 ENCOUNTER — Other Ambulatory Visit: Payer: Self-pay

## 2024-03-06 ENCOUNTER — Emergency Department

## 2024-03-06 DIAGNOSIS — E785 Hyperlipidemia, unspecified: Secondary | ICD-10-CM | POA: Diagnosis present

## 2024-03-06 DIAGNOSIS — X58XXXA Exposure to other specified factors, initial encounter: Secondary | ICD-10-CM | POA: Diagnosis present

## 2024-03-06 DIAGNOSIS — S301XXA Contusion of abdominal wall, initial encounter: Secondary | ICD-10-CM | POA: Diagnosis present

## 2024-03-06 DIAGNOSIS — Z1621 Resistance to vancomycin: Secondary | ICD-10-CM | POA: Diagnosis present

## 2024-03-06 DIAGNOSIS — R531 Weakness: Secondary | ICD-10-CM

## 2024-03-06 DIAGNOSIS — E663 Overweight: Secondary | ICD-10-CM | POA: Diagnosis present

## 2024-03-06 DIAGNOSIS — I350 Nonrheumatic aortic (valve) stenosis: Secondary | ICD-10-CM | POA: Diagnosis present

## 2024-03-06 DIAGNOSIS — R627 Adult failure to thrive: Secondary | ICD-10-CM | POA: Diagnosis not present

## 2024-03-06 DIAGNOSIS — I2510 Atherosclerotic heart disease of native coronary artery without angina pectoris: Secondary | ICD-10-CM | POA: Diagnosis present

## 2024-03-06 DIAGNOSIS — E875 Hyperkalemia: Secondary | ICD-10-CM | POA: Diagnosis not present

## 2024-03-06 DIAGNOSIS — Z87891 Personal history of nicotine dependence: Secondary | ICD-10-CM

## 2024-03-06 DIAGNOSIS — I10 Essential (primary) hypertension: Secondary | ICD-10-CM | POA: Diagnosis not present

## 2024-03-06 DIAGNOSIS — E440 Moderate protein-calorie malnutrition: Secondary | ICD-10-CM | POA: Diagnosis present

## 2024-03-06 DIAGNOSIS — Z66 Do not resuscitate: Secondary | ICD-10-CM | POA: Diagnosis present

## 2024-03-06 DIAGNOSIS — Z7901 Long term (current) use of anticoagulants: Secondary | ICD-10-CM

## 2024-03-06 DIAGNOSIS — M6281 Muscle weakness (generalized): Secondary | ICD-10-CM | POA: Diagnosis not present

## 2024-03-06 DIAGNOSIS — N200 Calculus of kidney: Secondary | ICD-10-CM | POA: Diagnosis not present

## 2024-03-06 DIAGNOSIS — D62 Acute posthemorrhagic anemia: Secondary | ICD-10-CM | POA: Diagnosis not present

## 2024-03-06 DIAGNOSIS — K650 Generalized (acute) peritonitis: Principal | ICD-10-CM | POA: Diagnosis present

## 2024-03-06 DIAGNOSIS — N179 Acute kidney failure, unspecified: Secondary | ICD-10-CM

## 2024-03-06 DIAGNOSIS — K219 Gastro-esophageal reflux disease without esophagitis: Secondary | ICD-10-CM | POA: Diagnosis present

## 2024-03-06 DIAGNOSIS — I480 Paroxysmal atrial fibrillation: Secondary | ICD-10-CM | POA: Diagnosis not present

## 2024-03-06 DIAGNOSIS — Z951 Presence of aortocoronary bypass graft: Secondary | ICD-10-CM

## 2024-03-06 DIAGNOSIS — E66811 Obesity, class 1: Secondary | ICD-10-CM | POA: Diagnosis present

## 2024-03-06 DIAGNOSIS — L89302 Pressure ulcer of unspecified buttock, stage 2: Secondary | ICD-10-CM | POA: Diagnosis present

## 2024-03-06 DIAGNOSIS — N281 Cyst of kidney, acquired: Secondary | ICD-10-CM | POA: Diagnosis not present

## 2024-03-06 DIAGNOSIS — Z7984 Long term (current) use of oral hypoglycemic drugs: Secondary | ICD-10-CM

## 2024-03-06 DIAGNOSIS — N400 Enlarged prostate without lower urinary tract symptoms: Secondary | ICD-10-CM | POA: Diagnosis present

## 2024-03-06 DIAGNOSIS — I5032 Chronic diastolic (congestive) heart failure: Secondary | ICD-10-CM | POA: Diagnosis present

## 2024-03-06 DIAGNOSIS — R7881 Bacteremia: Secondary | ICD-10-CM

## 2024-03-06 DIAGNOSIS — E43 Unspecified severe protein-calorie malnutrition: Secondary | ICD-10-CM | POA: Diagnosis present

## 2024-03-06 DIAGNOSIS — Z6830 Body mass index (BMI) 30.0-30.9, adult: Secondary | ICD-10-CM | POA: Diagnosis present

## 2024-03-06 DIAGNOSIS — G4700 Insomnia, unspecified: Secondary | ICD-10-CM | POA: Diagnosis present

## 2024-03-06 DIAGNOSIS — D649 Anemia, unspecified: Secondary | ICD-10-CM | POA: Diagnosis present

## 2024-03-06 DIAGNOSIS — B964 Proteus (mirabilis) (morganii) as the cause of diseases classified elsewhere: Secondary | ICD-10-CM | POA: Diagnosis present

## 2024-03-06 DIAGNOSIS — B952 Enterococcus as the cause of diseases classified elsewhere: Secondary | ICD-10-CM | POA: Diagnosis present

## 2024-03-06 DIAGNOSIS — N1831 Chronic kidney disease, stage 3a: Secondary | ICD-10-CM | POA: Diagnosis present

## 2024-03-06 DIAGNOSIS — D631 Anemia in chronic kidney disease: Secondary | ICD-10-CM | POA: Diagnosis present

## 2024-03-06 DIAGNOSIS — E872 Acidosis, unspecified: Secondary | ICD-10-CM | POA: Diagnosis not present

## 2024-03-06 DIAGNOSIS — I4891 Unspecified atrial fibrillation: Secondary | ICD-10-CM | POA: Diagnosis not present

## 2024-03-06 DIAGNOSIS — I361 Nonrheumatic tricuspid (valve) insufficiency: Secondary | ICD-10-CM | POA: Diagnosis not present

## 2024-03-06 DIAGNOSIS — I700 Atherosclerosis of aorta: Secondary | ICD-10-CM | POA: Diagnosis present

## 2024-03-06 DIAGNOSIS — R41841 Cognitive communication deficit: Secondary | ICD-10-CM | POA: Diagnosis not present

## 2024-03-06 DIAGNOSIS — L8990 Pressure ulcer of unspecified site, unspecified stage: Secondary | ICD-10-CM | POA: Insufficient documentation

## 2024-03-06 DIAGNOSIS — Z743 Need for continuous supervision: Secondary | ICD-10-CM | POA: Diagnosis not present

## 2024-03-06 DIAGNOSIS — I959 Hypotension, unspecified: Secondary | ICD-10-CM | POA: Diagnosis not present

## 2024-03-06 DIAGNOSIS — L02211 Cutaneous abscess of abdominal wall: Secondary | ICD-10-CM | POA: Diagnosis not present

## 2024-03-06 DIAGNOSIS — I5022 Chronic systolic (congestive) heart failure: Secondary | ICD-10-CM | POA: Diagnosis not present

## 2024-03-06 DIAGNOSIS — A4181 Sepsis due to Enterococcus: Principal | ICD-10-CM | POA: Diagnosis present

## 2024-03-06 DIAGNOSIS — Z6831 Body mass index (BMI) 31.0-31.9, adult: Secondary | ICD-10-CM | POA: Diagnosis not present

## 2024-03-06 DIAGNOSIS — Z8249 Family history of ischemic heart disease and other diseases of the circulatory system: Secondary | ICD-10-CM

## 2024-03-06 DIAGNOSIS — R1012 Left upper quadrant pain: Secondary | ICD-10-CM | POA: Diagnosis not present

## 2024-03-06 DIAGNOSIS — E1122 Type 2 diabetes mellitus with diabetic chronic kidney disease: Secondary | ICD-10-CM | POA: Diagnosis present

## 2024-03-06 DIAGNOSIS — K5730 Diverticulosis of large intestine without perforation or abscess without bleeding: Secondary | ICD-10-CM | POA: Diagnosis not present

## 2024-03-06 DIAGNOSIS — Z823 Family history of stroke: Secondary | ICD-10-CM

## 2024-03-06 DIAGNOSIS — K750 Abscess of liver: Principal | ICD-10-CM | POA: Diagnosis present

## 2024-03-06 DIAGNOSIS — K651 Peritoneal abscess: Secondary | ICD-10-CM | POA: Diagnosis not present

## 2024-03-06 DIAGNOSIS — K449 Diaphragmatic hernia without obstruction or gangrene: Secondary | ICD-10-CM | POA: Diagnosis not present

## 2024-03-06 DIAGNOSIS — E1169 Type 2 diabetes mellitus with other specified complication: Secondary | ICD-10-CM | POA: Diagnosis present

## 2024-03-06 DIAGNOSIS — Z8679 Personal history of other diseases of the circulatory system: Secondary | ICD-10-CM

## 2024-03-06 DIAGNOSIS — R001 Bradycardia, unspecified: Secondary | ICD-10-CM | POA: Diagnosis not present

## 2024-03-06 DIAGNOSIS — I255 Ischemic cardiomyopathy: Secondary | ICD-10-CM | POA: Diagnosis present

## 2024-03-06 DIAGNOSIS — R6520 Severe sepsis without septic shock: Secondary | ICD-10-CM | POA: Diagnosis not present

## 2024-03-06 DIAGNOSIS — R2689 Other abnormalities of gait and mobility: Secondary | ICD-10-CM | POA: Diagnosis not present

## 2024-03-06 DIAGNOSIS — Z7982 Long term (current) use of aspirin: Secondary | ICD-10-CM

## 2024-03-06 DIAGNOSIS — I130 Hypertensive heart and chronic kidney disease with heart failure and stage 1 through stage 4 chronic kidney disease, or unspecified chronic kidney disease: Secondary | ICD-10-CM | POA: Diagnosis present

## 2024-03-06 DIAGNOSIS — I340 Nonrheumatic mitral (valve) insufficiency: Secondary | ICD-10-CM | POA: Diagnosis not present

## 2024-03-06 DIAGNOSIS — Z79899 Other long term (current) drug therapy: Secondary | ICD-10-CM

## 2024-03-06 DIAGNOSIS — R1011 Right upper quadrant pain: Secondary | ICD-10-CM | POA: Diagnosis present

## 2024-03-06 DIAGNOSIS — B9620 Unspecified Escherichia coli [E. coli] as the cause of diseases classified elsewhere: Secondary | ICD-10-CM | POA: Diagnosis present

## 2024-03-06 DIAGNOSIS — I351 Nonrheumatic aortic (valve) insufficiency: Secondary | ICD-10-CM | POA: Diagnosis not present

## 2024-03-06 DIAGNOSIS — A419 Sepsis, unspecified organism: Secondary | ICD-10-CM | POA: Diagnosis not present

## 2024-03-06 LAB — URINALYSIS, W/ REFLEX TO CULTURE (INFECTION SUSPECTED)
Bilirubin Urine: NEGATIVE
Glucose, UA: NEGATIVE mg/dL
Hgb urine dipstick: NEGATIVE
Ketones, ur: NEGATIVE mg/dL
Leukocytes,Ua: NEGATIVE
Nitrite: NEGATIVE
Protein, ur: NEGATIVE mg/dL
Specific Gravity, Urine: 1.013 (ref 1.005–1.030)
pH: 5 (ref 5.0–8.0)

## 2024-03-06 LAB — COMPREHENSIVE METABOLIC PANEL WITH GFR
ALT: 35 U/L (ref 0–44)
AST: 42 U/L — ABNORMAL HIGH (ref 15–41)
Albumin: 2.2 g/dL — ABNORMAL LOW (ref 3.5–5.0)
Alkaline Phosphatase: 109 U/L (ref 38–126)
Anion gap: 12 (ref 5–15)
BUN: 63 mg/dL — ABNORMAL HIGH (ref 8–23)
CO2: 24 mmol/L (ref 22–32)
Calcium: 8.5 mg/dL — ABNORMAL LOW (ref 8.9–10.3)
Chloride: 100 mmol/L (ref 98–111)
Creatinine, Ser: 1.82 mg/dL — ABNORMAL HIGH (ref 0.61–1.24)
GFR, Estimated: 37 mL/min — ABNORMAL LOW (ref >60)
Glucose, Bld: 176 mg/dL — ABNORMAL HIGH (ref 70–99)
Potassium: 5.4 mmol/L — ABNORMAL HIGH (ref 3.5–5.1)
Sodium: 136 mmol/L (ref 135–145)
Total Bilirubin: 0.6 mg/dL (ref 0.0–1.2)
Total Protein: 7.8 g/dL (ref 6.5–8.1)

## 2024-03-06 LAB — CBC WITH DIFFERENTIAL/PLATELET
Abs Immature Granulocytes: 0.07 10*3/uL (ref 0.00–0.07)
Basophils Absolute: 0 10*3/uL (ref 0.0–0.1)
Basophils Relative: 0 %
Eosinophils Absolute: 0.1 10*3/uL (ref 0.0–0.5)
Eosinophils Relative: 1 %
HCT: 28.2 % — ABNORMAL LOW (ref 39.0–52.0)
Hemoglobin: 8.3 g/dL — ABNORMAL LOW (ref 13.0–17.0)
Immature Granulocytes: 1 %
Lymphocytes Relative: 13 %
Lymphs Abs: 1.7 10*3/uL (ref 0.7–4.0)
MCH: 24.6 pg — ABNORMAL LOW (ref 26.0–34.0)
MCHC: 29.4 g/dL — ABNORMAL LOW (ref 30.0–36.0)
MCV: 83.4 fL (ref 80.0–100.0)
Monocytes Absolute: 1 10*3/uL (ref 0.1–1.0)
Monocytes Relative: 8 %
Neutro Abs: 9.8 10*3/uL — ABNORMAL HIGH (ref 1.7–7.7)
Neutrophils Relative %: 77 %
Platelets: 432 10*3/uL — ABNORMAL HIGH (ref 150–400)
RBC: 3.38 MIL/uL — ABNORMAL LOW (ref 4.22–5.81)
RDW: 17.2 % — ABNORMAL HIGH (ref 11.5–15.5)
WBC: 12.6 10*3/uL — ABNORMAL HIGH (ref 4.0–10.5)
nRBC: 0 % (ref 0.0–0.2)

## 2024-03-06 LAB — LACTIC ACID, PLASMA
Lactic Acid, Venous: 2.6 mmol/L (ref 0.5–1.9)
Lactic Acid, Venous: 2.9 mmol/L (ref 0.5–1.9)
Lactic Acid, Venous: 1.5 mmol/L (ref 0.5–1.9)

## 2024-03-06 MED ORDER — IOHEXOL 300 MG/ML  SOLN
75.0000 mL | Freq: Once | INTRAMUSCULAR | Status: AC | PRN
  Administered 2024-03-06: 75 mL via INTRAVENOUS

## 2024-03-06 MED ORDER — BISACODYL 5 MG PO TBEC
10.0000 mg | DELAYED_RELEASE_TABLET | Freq: Every day | ORAL | Status: DC | PRN
  Administered 2024-03-17: 10 mg via ORAL
  Filled 2024-03-06: qty 2

## 2024-03-06 MED ORDER — MELATONIN 5 MG PO TABS
5.0000 mg | ORAL_TABLET | Freq: Every evening | ORAL | Status: DC | PRN
  Administered 2024-03-06 – 2024-03-16 (×4): 5 mg via ORAL
  Filled 2024-03-06 (×4): qty 1

## 2024-03-06 MED ORDER — ACETAMINOPHEN 650 MG RE SUPP
650.0000 mg | Freq: Four times a day (QID) | RECTAL | Status: AC | PRN
Start: 2024-03-06 — End: 2024-03-11

## 2024-03-06 MED ORDER — SODIUM CHLORIDE 0.9 % IV BOLUS (SEPSIS)
500.0000 mL | Freq: Once | INTRAVENOUS | Status: AC
  Administered 2024-03-06: 500 mL via INTRAVENOUS

## 2024-03-06 MED ORDER — ONDANSETRON HCL 4 MG/2ML IJ SOLN: 4.0000 mg | Freq: Four times a day (QID) | INTRAMUSCULAR | Status: AC | PRN

## 2024-03-06 MED ORDER — SODIUM CHLORIDE 0.9 % IV BOLUS
500.0000 mL | Freq: Once | INTRAVENOUS | Status: AC
  Administered 2024-03-06: 500 mL via INTRAVENOUS

## 2024-03-06 MED ORDER — POLYETHYLENE GLYCOL 3350 17 G PO PACK: 17.0000 g | PACK | Freq: Every day | ORAL | Status: DC | PRN

## 2024-03-06 MED ORDER — ACETAMINOPHEN 325 MG PO TABS
650.0000 mg | ORAL_TABLET | Freq: Four times a day (QID) | ORAL | Status: AC | PRN
  Administered 2024-03-07: 650 mg via ORAL
  Filled 2024-03-06: qty 2

## 2024-03-06 MED ORDER — POLYETHYLENE GLYCOL 3350 17 GM/SCOOP PO POWD: 17.0000 g | Freq: Every day | ORAL | Status: DC | PRN

## 2024-03-06 MED ORDER — MIRTAZAPINE 15 MG PO TABS
30.0000 mg | ORAL_TABLET | Freq: Every day | ORAL | Status: DC
  Administered 2024-03-06 – 2024-03-16 (×11): 30 mg via ORAL
  Filled 2024-03-06 (×11): qty 2

## 2024-03-06 MED ORDER — ROSUVASTATIN CALCIUM 10 MG PO TABS
10.0000 mg | ORAL_TABLET | Freq: Every day | ORAL | Status: DC
  Administered 2024-03-06 – 2024-03-16 (×11): 10 mg via ORAL
  Filled 2024-03-06 (×11): qty 1

## 2024-03-06 MED ORDER — METOPROLOL TARTRATE 25 MG PO TABS
12.5000 mg | ORAL_TABLET | Freq: Two times a day (BID) | ORAL | Status: DC
  Filled 2024-03-06 (×2): qty 1

## 2024-03-06 MED ORDER — SODIUM CHLORIDE 0.9 % IV SOLN
2.0000 g | Freq: Two times a day (BID) | INTRAVENOUS | Status: DC
  Administered 2024-03-07 – 2024-03-10 (×7): 2 g via INTRAVENOUS
  Filled 2024-03-06 (×9): qty 12.5

## 2024-03-06 MED ORDER — ONDANSETRON HCL 4 MG PO TABS: 4.0000 mg | ORAL_TABLET | Freq: Four times a day (QID) | ORAL | Status: AC | PRN

## 2024-03-06 MED ORDER — HYDRALAZINE HCL 20 MG/ML IJ SOLN: 5.0000 mg | Freq: Four times a day (QID) | INTRAMUSCULAR | Status: AC | PRN

## 2024-03-06 MED ORDER — METRONIDAZOLE 500 MG/100ML IV SOLN
500.0000 mg | Freq: Two times a day (BID) | INTRAVENOUS | Status: DC
  Administered 2024-03-06 – 2024-03-10 (×8): 500 mg via INTRAVENOUS
  Filled 2024-03-06 (×9): qty 100

## 2024-03-06 MED ORDER — VITAMIN B-12 1000 MCG PO TABS
1000.0000 ug | ORAL_TABLET | ORAL | Status: DC
  Administered 2024-03-07 – 2024-03-17 (×5): 1000 ug via ORAL
  Filled 2024-03-06 (×6): qty 1

## 2024-03-06 MED ORDER — SODIUM CHLORIDE 0.9 % IV SOLN
2.0000 g | Freq: Once | INTRAVENOUS | Status: AC
  Administered 2024-03-06: 2 g via INTRAVENOUS
  Filled 2024-03-06: qty 12.5

## 2024-03-06 MED ORDER — TORSEMIDE 20 MG PO TABS: 20.0000 mg | ORAL_TABLET | ORAL | Status: DC

## 2024-03-06 MED ORDER — SODIUM CHLORIDE 0.9 % IV SOLN: INTRAVENOUS | Status: AC

## 2024-03-06 MED ORDER — METRONIDAZOLE 500 MG/100ML IV SOLN
500.0000 mg | Freq: Once | INTRAVENOUS | Status: AC
  Administered 2024-03-06: 500 mg via INTRAVENOUS
  Filled 2024-03-06: qty 100

## 2024-03-06 NOTE — Hospital Course (Addendum)
 [name] is a 83 year old male with history of atrial fibrillation on Eliquis , hypertension, CKD 3a, non-insulin -dependent diabetes mellitus, history of endocarditis with Streptococcus bacteremia, history of E. coli bacteremia, who presents ED for chief concerns of perihepatic abscess seen on outpatient imaging.  Patient was having right flank and upper back pain for several weeks now.  Vitals in the ED showed t of 98.5, respiration rate of 23, heart rate 74, blood pressure 101/47, SpO2 100% on room air.  Serum sodium is 136, potassium 5.4, chloride 100, bicarb 24, BUN of 63, serum creatinine 1.82, EGFR 37, nonfasting blood glucose 176, WBC 12.6, hemoglobin 8.3, platelets of 432. Initial lactic acid was 2.6 and on repeat is 2.9.  Blood cultures were drawn.  UA was not consistent with UTI.  ED treatment: Cefepime  2 g IV, Flagyl  500 mg IV one-time doses, sodium chloride  500 mg bolus x 2.  IR was consulted to see the possibility of drain placement.  6/27: Blood pressure is soft at 87/45 -500 cc bolus ordered. Preliminary blood cultures negative in 24-hour.  Leukocytosis resolved, decrease of hemoglobin to 7.1 this morning-all cell lines decreased.  Hyperkalemia with potassium at 5.5.  Received management per hyperkalemia protocol.  6/28: Blood pressure is soft at 85/51.  Hemoglobin at 7.4 after improving to 7.9 s/p 1 unit of PRBC.  Active bleeding from drain, discussed with IR and they were recommending doing a CTA of her abdomen which was ordered-ordered 2 more unit of PRBC Significant hypoalbuminemia with albumin  at 1.6, slowly improving renal function with creatinine at 1.41, potassium at 5-continuing Lokelma now.  Preliminary blood cultures positive for Enterococcus faecalis and abscess cultures growing gram-positive cocci and gram-negative rods-ID was consulted and daptomycin was added to cefepime  and Flagyl .  6/29: Hemodynamically stable, hemoglobin stable now at 8.8, leukocytosis has  been resolved.  Renal function improved 1.17.  CTA abdomen yesterday with no active hemorrhage.  Did show extensive collection of fluid at right lung base extending up to [place] base and right abdominal wall-some improvement since this admission after the drain.  Still awaiting cultures from drain.  6/30: Hemodynamically stable, continue to have bloody serous discharge in drain.  No pain today. Repeat blood cultures were ordered.  7/1: Vitals and labs stable.  Repeat blood culture Echocardiogram with normal EF, grade 1 diastolic dysfunction, mild to moderate AAS.  Cardiology was consulted forTEE to completely rule out endocarditis due to his prior history.  Antibiotics switched with Zosyn  based on sensitivity results by ID.

## 2024-03-06 NOTE — Consult Note (Signed)
 CODE SEPSIS - PHARMACY COMMUNICATION  **Broad-spectrum antimicrobials should be administered within one hour of sepsis diagnosis**  Time Code Sepsis call or page was received: 1245  Antibiotics ordered: Cefepime , Metronidazole   Time of first antibiotic administration: 1315  Additional action taken by pharmacy: N/A  If necessary, name of provider/nurse contacted: N/A    Will M. Lenon, PharmD Clinical Pharmacist 03/06/2024 12:59 PM

## 2024-03-06 NOTE — ED Notes (Signed)
 ED Provider at bedside.

## 2024-03-06 NOTE — Assessment & Plan Note (Signed)
-  Registered dietitian has been consulted 

## 2024-03-06 NOTE — Assessment & Plan Note (Addendum)
 Potassium initially at 5.5 this morning s/p insulin  with dextrose  and albuterol  and improved to 4.5.  - Continue to monitor - Holding spironolactone

## 2024-03-06 NOTE — Assessment & Plan Note (Signed)
Rosuvastatin 10 mg daily

## 2024-03-06 NOTE — Sepsis Progress Note (Signed)
 Sepsis protocol is being followed by [facility].

## 2024-03-06 NOTE — ED Notes (Signed)
 Patient to CT.

## 2024-03-06 NOTE — ED Provider Notes (Signed)
 [facility] Provider Note    Event Date/Time   First MD Initiated Contact with Patient 03/06/24 1122     (approximate)   History   Abdominal Pain   HPI  [name] Young. is a 83 year old male with history of CHF, CKD, HTN, T2DM, A-fib on Eliquis  presenting to the emergency department for evaluation of abdominal pain.  Patient reports that over the past several days he has had some right upper abdominal pain.  A week ago he saw his pulmonologist who ordered a CT.  This was concerning for perihepatic abscess for which she was directed to the ER for further evaluation.  Denies fevers or chills.  Has not resumed Eliquis  after recent admission for hemothorax.  Reviewed his discharge summary from 11/29/2023.  At that time, patient presented with a hemothorax secondary to prior thoracenteses he was admitted to the ICU, required transfusion of 1 unit of PRBCs.  I additionally reviewed his office visit with [name] Young from 02/14/2024.  At that time, patient presented with a right-sided pleural effusion thought to be related to CHF, worsening anemia with hemoglobin of 7.7.  CT of the chest, abdomen, pelvis ordered at that time.  CT impression copied below.  CT CAP 6/16: IMPRESSION: 1. Interval removal of right-sided pleural drain with decrease in size of small loculated right pleural effusion. 2. Interval decrease in size of hyperdense posterior right pleural mass. 3. New subcapsular fluid collection adjacent to the inferior right hepatic lobe measuring up to 7.5 cm. Abscess is a possibility. Second tiny subcapsular fluid collection is seen. 4. Intramuscular wall thickening and overlying subcutaneous edema at the level of the right eleventh and twelfth rib adjacent to the right liver subcapsular fluid collection. Findings may be related to hematoma, infection, or neoplasm. 5. Nonobstructing right renal calculi. 6. Colonic diverticulosis. 7. Prostatomegaly. 8.  Aortic atherosclerosis.        Physical Exam   Triage Vital Signs: ED Triage Vitals [03/06/24 1115]  Encounter Vitals Group     BP (!) 82/41     Girls Systolic BP Percentile      Girls Diastolic BP Percentile      Boys Systolic BP Percentile      Boys Diastolic BP Percentile      Pulse Rate 85     Resp 17     Temp 98.5 F (36.9 C)     Temp Source Oral     SpO2 100 %     Weight 216 lb 4.3 oz (98.1 kg)     Height 5' 10 (1.778 m)     Head Circumference      Peak Flow      Pain Score 2     Pain Loc      Pain Education      Exclude from Growth Chart     Most recent vital signs: Vitals:   03/06/24 1330 03/06/24 1540  BP: (!) 107/49 (!) 121/59  Pulse: 64 72  Resp: (!) 22 20  Temp:  97.7 F (36.5 C)  SpO2: 100% 100%     General: Awake, interactive  CV:  Regular rate, good peripheral perfusion.  Resp:  Unlabored respirations, lungs clear to auscultation Abd:  Nondistended, soft, prominent area of palpable swelling along the right side of the abdomen with associated tenderness to palpation Neuro:  Symmetric facial movement, fluid speech   ED Results / Procedures / Treatments   Labs (all labs ordered are listed, but only abnormal results  are displayed) Labs Reviewed  LACTIC ACID, PLASMA - Abnormal; Notable for the following components:      Result Value   Lactic Acid, Venous 2.6 (*)    All other components within normal limits  LACTIC ACID, PLASMA - Abnormal; Notable for the following components:   Lactic Acid, Venous 2.9 (*)    All other components within normal limits  COMPREHENSIVE METABOLIC PANEL WITH GFR - Abnormal; Notable for the following components:   Potassium 5.4 (*)    Glucose, Bld 176 (*)    BUN 63 (*)    Creatinine, Ser 1.82 (*)    Calcium  8.5 (*)    Albumin  2.2 (*)    AST 42 (*)    GFR, Estimated 37 (*)    All other components within normal limits  CBC WITH DIFFERENTIAL/PLATELET - Abnormal; Notable for the following components:   WBC 12.6  (*)    RBC 3.38 (*)    Hemoglobin 8.3 (*)    HCT 28.2 (*)    MCH 24.6 (*)    MCHC 29.4 (*)    RDW 17.2 (*)    Platelets 432 (*)    Neutro Abs 9.8 (*)    All other components within normal limits  URINALYSIS, W/ REFLEX TO CULTURE (INFECTION SUSPECTED) - Abnormal; Notable for the following components:   Color, Urine YELLOW (*)    APPearance HAZY (*)    Bacteria, UA RARE (*)    All other components within normal limits  CULTURE, BLOOD (ROUTINE X 2)  CULTURE, BLOOD (ROUTINE X 2)  BODY FLUID CULTURE W GRAM STAIN  LACTIC ACID, PLASMA  TYPE AND SCREEN     EKG EKG independently reviewed and interpreted by myself demonstrates:  EKG demonstrate sinus rhythm at a rate of 80, PR 170, QRS 98, QTc 418, no acute ST changes  RADIOLOGY Imaging independently reviewed and interpreted by myself demonstrates:  CXR without focal consolidation CT abdomen pelvis pending  Formal Radiology Read:  CT ABDOMEN PELVIS W CONTRAST Result Date: 03/06/2024 CLINICAL DATA:  Perihepatic abscess. EXAM: CT ABDOMEN AND PELVIS WITH CONTRAST TECHNIQUE: Multidetector CT imaging of the abdomen and pelvis was performed using the standard protocol following bolus administration of intravenous contrast. RADIATION DOSE REDUCTION: This exam was performed according to the departmental dose-optimization program which includes automated exposure control, adjustment of the mA and/or kV according to patient size and/or use of iterative reconstruction technique. CONTRAST:  75mL OMNIPAQUE  IOHEXOL  300 MG/ML  SOLN COMPARISON:  CT chest and pelvis dated 02/25/2024. FINDINGS: Lower chest: Similar appearance of small right pleural effusion and right posterior pleural thickening or mass. Additional subpleural nodule in the right middle lobe similar to prior CT. Coronary vascular calcification. No intra-abdominal free air.  Small perihepatic free fluid. Hepatobiliary: Complex collection extending from the subcapsular posterior right lobe of the  liver into the peritoneal surface posteriorly as well as posterolateral abdominal wall measures approximately 9.6 x 5.4 cm in greatest axial dimensions posterior to the liver and 14 x 7 cm in the right lateral abdominal wall and overall slightly increased in size since the prior CT. No biliary dilatation. Cholecystectomy. Pancreas: Unremarkable. No pancreatic ductal dilatation or surrounding inflammatory changes. Spleen: Normal in size without focal abnormality. Adrenals/Urinary Tract: The adrenal glands unremarkable. Right renal nonobstructing calculi measure up to 5 mm in the interpolar right kidney. No hydronephrosis. Bilateral renal cysts. The visualized ureters and urinary bladder appear unremarkable. Stomach/Bowel: Small hiatal hernia. There is moderate stool throughout the colon. There is sigmoid  diverticulosis. There is no bowel obstruction or active inflammation. The appendix is not visualized with certainty. No inflammatory changes identified in the right lower quadrant. Vascular/Lymphatic: Moderate aortoiliac atherosclerotic disease. The IVC is unremarkable. No portal venous gas. There is no adenopathy. Reproductive: The prostate and seminal vesicles are grossly unremarkable. Other: Small fat containing bilateral inguinal hernia. Musculoskeletal: Osteopenia with degenerative changes of the spine. No acute osseous pathology. IMPRESSION: 1. Slightly increased size of the complex collection extending from the subcapsular posterior right lobe of the liver into the posterior peritoneal surface and posterolateral abdominal wall. 2. Similar appearance of small right pleural effusion and right posterior pleural thickening or mass. 3. Sigmoid diverticulosis. No bowel obstruction. 4. Right renal nonobstructing calculi. No hydronephrosis. 5.  Aortic Atherosclerosis (ICD10-I70.0). Electronically Signed   By: Vanetta Chou M.D.   On: 03/06/2024 14:34   DG Chest Port 1 View Result Date: 03/06/2024 CLINICAL DATA:   Sepsis. EXAM: PORTABLE CHEST 1 VIEW COMPARISON:  December 06, 2023. FINDINGS: The heart size and mediastinal contours are within normal limits. Status post coronary artery bypass graft. Both lungs are clear. The visualized skeletal structures are unremarkable. IMPRESSION: No active disease. Electronically Signed   By: Lynwood Landy Raddle M.D.   On: 03/06/2024 12:30    PROCEDURES:  Critical Care performed: Yes, see critical care procedure note(s)  CRITICAL CARE Performed by: Nilsa Dade   Total critical care time: 31 minutes  Critical care time was exclusive of separately billable procedures and treating other patients.  Critical care was necessary to treat or prevent imminent or life-threatening deterioration.  Critical care was time spent personally by me on the following activities: development of treatment plan with patient and/or surrogate as well as nursing, discussions with consultants, evaluation of patient's response to treatment, examination of patient, obtaining history from patient or surrogate, ordering and performing treatments and interventions, ordering and review of laboratory studies, ordering and review of radiographic studies, pulse oximetry and re-evaluation of patient's condition.   Procedures   MEDICATIONS ORDERED IN ED: Medications  acetaminophen  (TYLENOL ) tablet 650 mg (has no administration in time range)    Or  acetaminophen  (TYLENOL ) suppository 650 mg (has no administration in time range)  ondansetron  (ZOFRAN ) tablet 4 mg (has no administration in time range)    Or  ondansetron  (ZOFRAN ) injection 4 mg (has no administration in time range)  metroNIDAZOLE  (FLAGYL ) IVPB 500 mg (has no administration in time range)  hydrALAZINE  (APRESOLINE ) injection 5 mg (has no administration in time range)  ceFEPIme  (MAXIPIME ) 2 g in sodium chloride  0.9 % 100 mL IVPB (has no administration in time range)  sodium chloride  0.9 % bolus 500 mL (0 mLs Intravenous Stopped 03/06/24 1314)   ceFEPIme  (MAXIPIME ) 2 g in sodium chloride  0.9 % 100 mL IVPB (0 g Intravenous Stopped 03/06/24 1410)  metroNIDAZOLE  (FLAGYL ) IVPB 500 mg (500 mg Intravenous New Bag/Given 03/06/24 1410)  sodium chloride  0.9 % bolus 500 mL (500 mLs Intravenous New Bag/Given 03/06/24 1320)  iohexol  (OMNIPAQUE ) 300 MG/ML solution 75 mL (75 mLs Intravenous Contrast Given 03/06/24 1351)     IMPRESSION / MDM / ASSESSMENT AND PLAN / ED COURSE  I reviewed the triage vital signs and the nursing notes.  Differential diagnosis includes, but is not limited to, perihepatic abscess, hematoma, electrolyte abnormality, lower suspicion other acute intra-abdominal process given recent CT findings  Patient's presentation is most consistent with acute presentation with potential threat to life or bodily function.  83 year old male presenting to the emergency department  for evaluation of right upper abdominal pain found to have hepatic fluid collection on recent outpatient CT.  I do note that back in 2024, patient did have a perihepatic abscess requiring IR drainage at that time.  Will obtain labs, send cultures, discussed with IR.  Of note, patient did have initial low blood pressure, improved on repeat.  Family notes that his blood pressure is typical for him.  On review of his prior records in our system, I do see that patient frequently has systolics in the 90s to 100s with low diastolics in the 30s to 50s.  Labs did demonstrate leukocytosis with WC of 12.6 and with his tachypnea, sepsis orders were initiated with empiric cefepime  and Flagyl .  However, given his chronic low blood pressure, do not feel that his blood pressure readings here are reflective of septic shock and do feel that large-volume fluid resuscitation would likely be harmful particularly given his history of CHF.  He was ordered for smaller aliquots of fluid given his AKI and lactic acidosis.  Clinical Course as of 03/06/24 1616  Thu Mar 06, 2024  1238 Case reviewed  with IR APP.  Will review with IR attending, [name] Young, but recommends initiation of IV antibiotics, admission to hospitalist service. [NR]  1300 Notified by our PA McInnis that IR team does recommend a CT abdomen pelvis with IV contrast for further planning, but do agree with plan for admission and antibiotics.  Will reach out to hospitalist team. [NR]    Clinical Course User Index [NR] Levander Slate, MD   Case discussed with hospitalist team.  They will evaluate for anticipated admission.  FINAL CLINICAL IMPRESSION(S) / ED DIAGNOSES   Final diagnoses:  Perihepatic abscess (HCC)  AKI (acute kidney injury) (HCC)     Rx / DC Orders   ED Discharge Orders     None        Note:  This document was prepared using Dragon voice recognition software and may include unintentional dictation errors.   Levander Slate, MD 03/06/24 [phone]

## 2024-03-06 NOTE — Assessment & Plan Note (Addendum)
 Home rosuvastatin  10 milligrams nightly resumed, metoprolol  tartrate 12.5 mg p.o. twice daily will resume Home aspirin  not resumed on admission in setting of anticipating IR procedure

## 2024-03-06 NOTE — H&P (Signed)
 History and Physical   [name] Young. FMW:[id number] DOB: 30-Sep-1940 DOA: 03/06/2024  PCP: Parris Manna, MD  Patient coming from: Home  I have personally briefly reviewed patient's old medical records in [facility] EMR.  Chief Concern: Abdominal pain  HPI: [name] Young is a 83 year old male with history of atrial fibrillation on Eliquis , hypertension, CKD 3a, non-insulin -dependent diabetes mellitus, history of endocarditis with Streptococcus bacteremia, history of E. coli bacteremia, who presents ED for chief concerns of perihepatic abscess seen on outpatient imaging.  Vitals in the ED showed t of 98.5, respiration rate of 23, heart rate 74, blood pressure 101/47, SpO2 100% on room air.  Serum sodium is 136, potassium 5.4, chloride 100, bicarb 24, BUN of 63, serum creatinine 1.82, EGFR 37, nonfasting blood glucose 176, WBC 12.6, hemoglobin 8.3, platelets of 432.  Initial lactic acid was 2.6 and on repeat is 2.9.  ED treatment: Cefepime  2 g IV, Flagyl  500 mg IV one-time doses, sodium chloride  500 mg bolus x 2. --------------------------------------- At bedside, patient able to tell me his first and last name, age, location, current calendar year.  He denies trauma to his person.  He reports he has been having right-sided flank pain and right-sided upper back pain for several weeks now.  He denies fever, chills, nausea, vomiting, chest pain, dysuria, hematuria, diarrhea.  He endorses lack of appetite over the same amount of time.  Social history: He lives at home with his wife.  He denies tobacco, EtOH, recreational drug use.  He is retired.  ROS: Constitutional: no weight change, no fever ENT/Mouth: no sore throat, no rhinorrhea Eyes: no eye pain, no vision changes Cardiovascular: no chest pain, no dyspnea,  no edema, no palpitations Respiratory: no cough, no sputum, no wheezing Gastrointestinal: no nausea, no vomiting, no diarrhea, no constipation Genitourinary: no  urinary incontinence, no dysuria, no hematuria Musculoskeletal: no arthralgias, no myalgias Skin: no skin lesions, no pruritus, Neuro: + weakness, no loss of consciousness, no syncope Psych: no anxiety, no depression, + decrease appetite Heme/Lymph: no bruising, no bleeding  ED Course: Discussed with EDP, patient requiring hospitalization for chief concerns of perihepatic abscess.  Assessment/Plan  Principal Problem:   Perihepatic abscess (HCC) Active Problems:   FTT (failure to thrive) in adult   Protein-calorie malnutrition, severe (HCC)   Hyperlipidemia associated with type 2 diabetes mellitus (HCC)   Obesity, Class I, BMI 30-34.9   DNR (do not resuscitate)   S/P CABG x 3   PAF (paroxysmal atrial fibrillation) (HCC)   Insomnia   General weakness   Essential hypertension   Hyperkalemia   AKI (acute kidney injury) (HCC)   Assessment and Plan:  * Perihepatic abscess (HCC) IR has been consulted Pending n.p.o. after midnight in anticipation of CT-guided peritoneal/retroperitoneal fluid drain by percutaneous catheter Continue with metronidazole  and cefepime  IV As patient took aspirin  this morning, IR will discuss with primary team whether or not can proceed with IR drain placement  Protein-calorie malnutrition, severe (HCC) Registered dietitian has been consulted  AKI (acute kidney injury) (HCC) AKI suspect secondary to poor p.o. intake in setting of right upper quadrant abdominal pain Status post sodium chloride  500 mL liter bolus, 2 doses were ordered Sodium chloride  infusion at 75 mL/h, 12 hours ordered Home torsemide  held on admission, AM team to resume when benefits outweigh the risk Recheck BMP in the a.m.  Hyperkalemia Spironolactone not resumed on admission AM team to resume when benefits outweigh the  Essential hypertension Lisinopril  2.5 mg daily,  spironolactone not resumed on admission AM team to resume when benefits outweigh the risk Metoprolol  12.5 mg  p.o. twice daily reason  Insomnia Melatonin nightly as needed for sleep  PAF (paroxysmal atrial fibrillation) (HCC) Metoprolol  tartrate 12.5 mg p.o. twice daily resume  S/P CABG x 3 Home rosuvastatin  10 milligrams nightly resumed, metoprolol  tartrate 12.5 mg p.o. twice daily will resume Home aspirin  not resumed on admission in setting of anticipating IR procedure Check PT/INR to monitor evaluate bleeding risk  Hyperlipidemia associated with type 2 diabetes mellitus (HCC) Rosuvastatin  10 mg daily  Chart reviewed.   DVT prophylaxis: TED hose; AM team to initiate pharmacologic DVT prophylaxis when benefits outweigh the risk Code Status: DNR/DNI Diet: Heart healthy/carb modified; n.p.o. after midnight Family Communication: Discussed with spouse over the phone Disposition Plan: Pending clinical course Consults called: IR  Admission status: telemetry medical, inpatient   Past Medical History:  Diagnosis Date   Adenomatous colon polyp    Aortic atherosclerosis (HCC)    Aortic stenosis    a. 11/2023 Echo: mild-mod AS, mean grad .   Arthritis    Ascending aorta dilatation (HCC)    a.) TTE 11/23/2017: asc Ao measured 37 mm; b.) TTE 10/23/2021: Ao root measured 41 mm, asc Ao measured 38 mm; c.) TEE 10/28/2021: asc Ao measured 38 mm; d.) TTE 01/11/2022: Ao root 40 mm, asc Ao 39 mm   Ascending cholangitis 10/2021   BPH (benign prostatic hyperplasia)    CKD (chronic kidney disease), stage III (HCC)    Claustrophobia    Coronary artery disease    a.) LHC 12/21/2017: 75% mLAD, 90% D1, 80-99% OM1/2, CTO pRCA (L-R collaterals) --> CVTS consult. b.) 01/2018 CABG x 3 (LIMA->LAD, VG->D1, VG->RPL); c. 01/2024 Cath: LM nl, LAD 20p, 13m, D1 70ost, 90, LCX 30ost, OM1 80, OM2 99- fills from collats from D1. RCA 100p CTO, RPAV 100 (fills via collats from 3rd septal), VG->RPDA nl, LIMA->LAD nl, VG->D1 nl. RHC PA 35/10 (8), PCWP 10, CO 6.5-->Med Rx.   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   In setting of bacteremia from ascending colangitis   GERD (gastroesophageal reflux disease)    Hemothorax on right 11/2023   HFrEF (heart failure with reduced ejection fraction) (HCC)    a.) TTE 11/23/2017: EF 50-55%, mod MAC, triv TR, G1DD; b.) TTE 10/23/2021: EF 40-45%, mild LAE, Ao sclerosis, triv MR, G2DD; c.) TEE 10/28/2021: EF 40-45%, glob HK, mobile vegitation on MV; d.) TTE 01/11/2022: EF 50-55%, mild LVH, RVE, Ao sclerosis, mild MR/AR, G1DD   History of cholelithiasis    History of kidney stones    ca ox [place] [facility]) now [place]   History of pneumonia    HLD (hyperlipidemia)    HTN (hypertension)    Ischemic cardiomyopathy    a. 12/2022 Echo: EF 50-55%; b. 11/2023 Echo: EF 40-45%, glob HK, mild LVH, GrI DD, nl RV fxn, mild MR, mild AI, mild-mod AS, Ao root 43mm.   Jaundice    age 53   Long term current use of anticoagulant    a.) apixaban    Paroxysmal atrial fibrillation (HCC)    a.) CHA2DS2VASc = 6 (age x 2, HFrEF, HTN, vascular disease history, T2DM);  b.) rate/rhythm maintained on oral metoprolol  succinate; chronically anticoagulated with apixaban    Pleural effusion    a. 11/2023 CT chest: Large R ple effusion, partially loculated-->s/p pleurx.   Pneumonia    Right-sided carotid artery disease (HCC)  a.) carotid doppler 04/05/2022: 1-39% RICA   S/P CABG x 3    a.) LIMA-LAD, SVG-diagonal, SVG-PL branch of RCA   S/P cataract extraction and insertion of intraocular lens    T2DM (type 2 diabetes mellitus) (HCC) 2010   Past Surgical History:  Procedure Laterality Date   CARPAL TUNNEL RELEASE Bilateral    CATARACT EXTRACTION, BILATERAL     COLONOSCOPY  11/2012   12 adenomatous polyps, diverticulosis, rec rpt 1 yr [facility])   COLONOSCOPY  12/2013   3 polyps, diverticulosis, rec rpt 3 yrs [facility])   COLONOSCOPY  06/2019   6 polyps (TA), diverticulosis, f/u left open ended [facility])   CORONARY ARTERY BYPASS GRAFT N/A 01/18/2018   Procedure:  CORONARY ARTERY BYPASS GRAFTING (CABG) x 3; Using Left Internal Mammary Artery, and Right Greater Saphenous Vein harvested Endoscopically, Coronary Artery Endarterectomy;  Surgeon: [name] Hanford Coy, MD;  Location: [facility] OR;  Service: Open Heart Surgery;  Laterality: N/A;   ERCP N/A 10/23/2021   Procedure: ENDOSCOPIC RETROGRADE CHOLANGIOPANCREATOGRAPHY (ERCP);  Surgeon: Aneita Gwendlyn DASEN, MD;  Location: [facility] ENDOSCOPY;  Service: Endoscopy;  Laterality: N/A;   IR CATHETER TUBE CHANGE  01/08/2023   IR PERC PLEURAL DRAIN W/INDWELL CATH W/IMG GUIDE  11/20/2023   IR RADIOLOGIST EVAL & MGMT  01/23/2023   IR THORACENTESIS ASP PLEURAL SPACE W/IMG GUIDE  12/29/2022   KNEE CARTILAGE SURGERY Left    LEFT HEART CATH AND CORONARY ANGIOGRAPHY N/A 12/21/2017   Procedure: LEFT HEART CATH AND CORONARY ANGIOGRAPHY;  Surgeon: Mady Bruckner, MD;  Location: [facility] INVASIVE CV LAB;  Service: Cardiovascular;  Laterality: N/A;   LITHOTRIPSY     REMOVAL OF STONES  10/23/2021   Procedure: REMOVAL OF STONES;  Surgeon: Aneita Gwendlyn DASEN, MD;  Location: [facility] ENDOSCOPY;  Service: Endoscopy;;   RIGHT/LEFT HEART CATH AND CORONARY/GRAFT ANGIOGRAPHY Bilateral 01/25/2024   Procedure: RIGHT/LEFT HEART CATH AND CORONARY/GRAFT ANGIOGRAPHY;  Surgeon: Mady Bruckner, MD;  Location: [facility] INVASIVE CV LAB;  Service: Cardiovascular;  Laterality: Bilateral;   SPHINCTEROTOMY  10/23/2021   Procedure: SPHINCTEROTOMY;  Surgeon: Aneita Gwendlyn DASEN, MD;  Location: [facility] ENDOSCOPY;  Service: Endoscopy;;   TEE WITHOUT CARDIOVERSION N/A 01/18/2018   Procedure: TRANSESOPHAGEAL ECHOCARDIOGRAM (TEE);  Surgeon: [name] Hanford, Coy, MD;  Location[facility] OR;  Service: Open Heart Surgery;  Laterality: N/A;   TEE WITHOUT CARDIOVERSION N/A 10/28/2021   Procedure: TRANSESOPHAGEAL ECHOCARDIOGRAM (TEE);  Surgeon: Mona Vinie BROCKS, MD;  Location: [facility] ENDOSCOPY;  Service: Cardiovascular;  Laterality: N/A;   UMBILICAL HERNIA REPAIR     with mesh    Social History:  reports that he has never  smoked. He has been exposed to tobacco smoke. He quit smokeless tobacco use about 22 years ago.  His smokeless tobacco use included chew. He reports that he does not currently use alcohol. He reports that he does not use drugs.  No Known Allergies Family History  Problem Relation Age of Onset   Alzheimer's disease Mother    Breast cancer Mother        breast   Atrial fibrillation Mother    CAD Mother    Stroke Father 24   Diabetes Father    CAD Father    Colon polyps Sister    Colon polyps Brother    Rectal cancer Maternal Grandfather        rectal   Colon cancer Maternal Grandfather 33   Esophageal cancer Neg Hx    Stomach cancer Neg Hx    Family history: Family history reviewed and  not pertinent  Prior to Admission medications   Medication Sig Start Date End Date Taking? Authorizing Provider  aspirin  EC 81 MG tablet Take 81 mg by mouth daily. Swallow whole.   Yes [provider]  bisacodyl  (DULCOLAX) 5 MG EC tablet Take 10 mg by mouth at bedtime.   Yes [provider]  Cholecalciferol  (VITAMIN D3) 25 MCG (1000 UT) CAPS Take 1 capsule (1,000 Units total) by mouth daily. 05/24/20  Yes Rilla Baller, MD  Cyanocobalamin  (B-12) 1000 MCG CAPS Take 1 capsule by mouth every Monday, Wednesday, and Friday. 07/16/23  Yes Rilla Baller, MD  feeding supplement (ENSURE ENLIVE / ENSURE PLUS) LIQD Take 237 mLs by mouth 2 (two) times daily between meals. Patient taking differently: Take 237 mLs by mouth daily. 01/05/23  Yes Wieting, Richard, MD  ferrous sulfate  325 (65 FE) MG EC tablet Take 1 tablet (325 mg total) by mouth daily. 01/09/24  Yes End, Lonni, MD  lisinopril  (ZESTRIL ) 2.5 MG tablet Take 2.5 mg by mouth daily. 12/19/23 12/18/24 Yes [provider]  Magnesium  250 MG TABS Take 250 mg by mouth daily.   Yes [provider]  metoprolol  tartrate (LOPRESSOR ) 25 MG tablet Take 0.5 tablets (12.5 mg total) by mouth 2 (two) times daily. 12/10/23  Yes  Rilla Baller, MD  mirtazapine  (REMERON ) 30 MG tablet Take 1 tablet (30 mg total) by mouth at bedtime. 12/31/23  Yes Rilla Baller, MD  Multiple Vitamins-Minerals (PRESERVISION AREDS 2 PO) Take 1 capsule by mouth daily.   Yes [provider]  polyethylene glycol powder (GLYCOLAX /MIRALAX ) 17 GM/SCOOP powder Take 8.5-17 g by mouth daily. Patient taking differently: Take 8.5-17 g by mouth daily as needed for moderate constipation. 12/10/23  Yes Rilla Baller, MD  rosuvastatin  (CRESTOR ) 10 MG tablet TAKE 1 TABLET BY MOUTH EVERY DAY 12/19/23  Yes Vivienne Lonni Ingle, NP  spironolactone (ALDACTONE) 25 MG tablet Take 25 mg by mouth daily. 12/19/23 03/18/24 Yes [provider]  tamsulosin  (FLOMAX ) 0.4 MG CAPS capsule Take 1 capsule (0.4 mg total) by mouth daily. 07/16/23  Yes Rilla Baller, MD  torsemide  (DEMADEX ) 20 MG tablet Take 1 tablet (20 mg total) by mouth every other day. 01/25/24 04/24/24 Yes End, Lonni, MD  ELIQUIS  5 MG TABS tablet TAKE 1 TABLET BY MOUTH TWICE A DAY Patient not taking: Reported on 02/08/2024 09/19/23   End, Lonni, MD  glucose blood (TRUE METRIX BLOOD GLUCOSE TEST) test strip Use as instructed to check blood sugar once a day 03/06/23   Rilla Baller, MD    Physical Exam: Vitals:   03/06/24 1200 03/06/24 1300 03/06/24 1330 03/06/24 1540  BP: (!) 101/47 (!) 107/45 (!) 107/49 (!) 121/59  Pulse: 74 67 64 72  Resp: (!) 23 (!) 30 (!) 22 20  Temp:    97.7 F (36.5 C)  TempSrc:    Oral  SpO2: 100% 100% 100% 100%  Weight:      Height:       Constitutional: appears frail, cachectic, chronically ill Eyes: PERRL, lids and conjunctivae normal ENMT: Mucous membranes are moist. Posterior pharynx clear of any exudate or lesions. Age-appropriate dentition. Hearing appropriate Neck: normal, supple, no masses, no thyromegaly Respiratory: clear to auscultation bilaterally, no wheezing, no crackles. Normal respiratory effort. No accessory muscle  use.  Cardiovascular: Regular rate and rhythm, no murmurs / rubs / gallops. No extremity edema. 2+ pedal pulses. No carotid bruits.  Abdomen: Distended abdomen, + RUQ tenderness, no masses palpated, no hepatosplenomegaly. Bowel sounds positive.  Musculoskeletal: no clubbing / cyanosis. No joint deformity upper and lower extremities. Good ROM, no contractures, no atrophy. Normal muscle tone.  Skin: no rashes, lesions, ulcers. No induration Neurologic: Sensation intact. Strength 5/5 in all 4.  Psychiatric: Normal judgment and insight. Alert and oriented x 3.  Depressed mood.  Flat affect  EKG: independently reviewed, showing sinus rhythm with rate of 80, QTc 418  Chest x-ray on Admission: I personally reviewed and I agree with radiologist reading as below.  CT ABDOMEN PELVIS W CONTRAST Result Date: 03/06/2024 CLINICAL DATA:  Perihepatic abscess. EXAM: CT ABDOMEN AND PELVIS WITH CONTRAST TECHNIQUE: Multidetector CT imaging of the abdomen and pelvis was performed using the standard protocol following bolus administration of intravenous contrast. RADIATION DOSE REDUCTION: This exam was performed according to the departmental dose-optimization program which includes automated exposure control, adjustment of the mA and/or kV according to patient size and/or use of iterative reconstruction technique. CONTRAST:  75mL OMNIPAQUE  IOHEXOL  300 MG/ML  SOLN COMPARISON:  CT chest and pelvis dated 02/25/2024. FINDINGS: Lower chest: Similar appearance of small right pleural effusion and right posterior pleural thickening or mass. Additional subpleural nodule in the right middle lobe similar to prior CT. Coronary vascular calcification. No intra-abdominal free air.  Small perihepatic free fluid. Hepatobiliary: Complex collection extending from the subcapsular posterior right lobe of the liver into the peritoneal surface posteriorly as well as posterolateral abdominal wall measures approximately 9.6 x 5.4 cm in greatest  axial dimensions posterior to the liver and 14 x 7 cm in the right lateral abdominal wall and overall slightly increased in size since the prior CT. No biliary dilatation. Cholecystectomy. Pancreas: Unremarkable. No pancreatic ductal dilatation or surrounding inflammatory changes. Spleen: Normal in size without focal abnormality. Adrenals/Urinary Tract: The adrenal glands unremarkable. Right renal nonobstructing calculi measure up to 5 mm in the interpolar right kidney. No hydronephrosis. Bilateral renal cysts. The visualized ureters and urinary bladder appear unremarkable. Stomach/Bowel: Small hiatal hernia. There is moderate stool throughout the colon. There is sigmoid diverticulosis. There is no bowel obstruction or active inflammation. The appendix is not visualized with certainty. No inflammatory changes identified in the right lower quadrant. Vascular/Lymphatic: Moderate aortoiliac atherosclerotic disease. The IVC is unremarkable. No portal venous gas. There is no adenopathy. Reproductive: The prostate and seminal vesicles are grossly unremarkable. Other: Small fat containing bilateral inguinal hernia. Musculoskeletal: Osteopenia with degenerative changes of the spine. No acute osseous pathology. IMPRESSION: 1. Slightly increased size of the complex collection extending from the subcapsular posterior right lobe of the liver into the posterior peritoneal surface and posterolateral abdominal wall. 2. Similar appearance of small right pleural effusion and right posterior pleural thickening or mass. 3. Sigmoid diverticulosis. No bowel obstruction. 4. Right renal nonobstructing calculi. No hydronephrosis. 5.  Aortic Atherosclerosis (ICD10-I70.0). Electronically Signed   By: Vanetta Chou M.D.   On: 03/06/2024 14:34   DG Chest Port 1 View Result Date: 03/06/2024 CLINICAL DATA:  Sepsis. EXAM: PORTABLE CHEST 1 VIEW COMPARISON:  December 06, 2023. FINDINGS: The heart size and mediastinal contours are within normal  limits. Status post coronary artery bypass graft. Both lungs are clear. The visualized skeletal structures are unremarkable. IMPRESSION: No active disease. Electronically Signed   By: Lynwood Landy Raddle M.D.   On: 03/06/2024 12:30   Labs on Admission: I have personally reviewed following labs  CBC: Recent Labs  Lab 03/06/24 1122  WBC 12.6*  NEUTROABS 9.8*  HGB 8.3*  HCT 28.2*  MCV 83.4  PLT 432*  Basic Metabolic Panel: Recent Labs  Lab 03/06/24 1122  NA 136  K 5.4*  CL 100  CO2 24  GLUCOSE 176*  BUN 63*  CREATININE 1.82*  CALCIUM  8.5*   GFR: Estimated Creatinine Clearance: 37.4 mL/min (A) (by C-G formula based on SCr of 1.82 mg/dL (H)).  Liver Function Tests: Recent Labs  Lab 03/06/24 1122  AST 42*  ALT 35  ALKPHOS 109  BILITOT 0.6  PROT 7.8  ALBUMIN  2.2*   BNP (last 3 results) Recent Labs    04/25/23 1516  PROBNP 142.0*   Urine analysis:    Component Value Date/Time   COLORURINE YELLOW (A) 03/06/2024 1413   APPEARANCEUR HAZY (A) 03/06/2024 1413   LABSPEC 1.013 03/06/2024 1413   PHURINE 5.0 03/06/2024 1413   GLUCOSEU NEGATIVE 03/06/2024 1413   HGBUR NEGATIVE 03/06/2024 1413   BILIRUBINUR NEGATIVE 03/06/2024 1413   BILIRUBINUR negative 07/16/2023 1137   KETONESUR NEGATIVE 03/06/2024 1413   PROTEINUR NEGATIVE 03/06/2024 1413   UROBILINOGEN 0.2 07/16/2023 1137   UROBILINOGEN 1.0 06/24/2013 2159   NITRITE NEGATIVE 03/06/2024 1413   LEUKOCYTESUR NEGATIVE 03/06/2024 1413   This document was prepared using Dragon Voice Recognition software and may include unintentional dictation errors.  Dr. Sherre [facility]  If 7PM-7AM, please contact overnight-coverage provider If 7AM-7PM, please contact day attending provider [email or web address]  03/06/2024, 6:37 PM

## 2024-03-06 NOTE — Assessment & Plan Note (Addendum)
 Blood pressure borderline soft Continue metoprolol  12.5 mg p.o. twice daily  Keep holding home lisinopril  and spironolactone

## 2024-03-06 NOTE — Assessment & Plan Note (Signed)
 Melatonin nightly as needed for sleep

## 2024-03-06 NOTE — Assessment & Plan Note (Addendum)
 AKI suspect secondary to poor p.o. intake in setting of right upper quadrant abdominal pain. Improving creatinine with IV fluid, currently at 1.54, baseline appears to be around 1.1-1.3. - Continue with gentle IV fluid - Monitor renal function - Keep holding torsemide 

## 2024-03-06 NOTE — ED Triage Notes (Signed)
 Pt here for an evaluation of a perihepatic abscess. Pt had a CT scan from Dr. Parris on 6/16 and was told to come to the ED. Pt c/o severe back pain and has been hypotensive in triage.

## 2024-03-06 NOTE — Assessment & Plan Note (Addendum)
 IR has been consulted Awaiting CT-guided peritoneal/retroperitoneal fluid drain by percutaneous catheter Continue with metronidazole  and cefepime  IV - Follow-up culture results after the procedure

## 2024-03-06 NOTE — ED Notes (Signed)
 While providing patient care the patients spouse at the bedside continually making request on temp, blankets, and questioning this nurse with every step of care being provided. While this nurse was preparing the patients Iv abx the patient stated he needed to urinate. Urinal provided and patients spouse asked to assist the patient due to having hands full. Spouse making comments while assisting with care regarding how the medical world is not like it use to be and how family aren't treated fairly. This nurse ignored rude comments. Once finished the spouse went to dump the urine in the toilet and I asked to set the urinal on the counter that I would collect a specimen and dump it. Pts spouse stated may I please have a urine specimen in a passive aggressive demeanor to this nurse which was not acknowledged. Care completed an this nurse exited.

## 2024-03-06 NOTE — Assessment & Plan Note (Addendum)
 Intermittent heart rate in 40s - Continuing metoprolol  if blood pressure and heart rate allows

## 2024-03-06 NOTE — Sepsis Progress Note (Signed)
 Notified provider of need to consider order for repeat lactic since LA was trending up.

## 2024-03-07 ENCOUNTER — Inpatient Hospital Stay

## 2024-03-07 ENCOUNTER — Inpatient Hospital Stay: Admitting: Radiology

## 2024-03-07 DIAGNOSIS — D649 Anemia, unspecified: Secondary | ICD-10-CM

## 2024-03-07 DIAGNOSIS — K650 Generalized (acute) peritonitis: Secondary | ICD-10-CM | POA: Diagnosis not present

## 2024-03-07 DIAGNOSIS — E875 Hyperkalemia: Secondary | ICD-10-CM

## 2024-03-07 DIAGNOSIS — R531 Weakness: Secondary | ICD-10-CM

## 2024-03-07 DIAGNOSIS — E785 Hyperlipidemia, unspecified: Secondary | ICD-10-CM

## 2024-03-07 DIAGNOSIS — I10 Essential (primary) hypertension: Secondary | ICD-10-CM

## 2024-03-07 DIAGNOSIS — R627 Adult failure to thrive: Secondary | ICD-10-CM

## 2024-03-07 DIAGNOSIS — L8990 Pressure ulcer of unspecified site, unspecified stage: Secondary | ICD-10-CM | POA: Insufficient documentation

## 2024-03-07 DIAGNOSIS — N179 Acute kidney failure, unspecified: Secondary | ICD-10-CM | POA: Diagnosis not present

## 2024-03-07 DIAGNOSIS — E43 Unspecified severe protein-calorie malnutrition: Secondary | ICD-10-CM

## 2024-03-07 DIAGNOSIS — Z951 Presence of aortocoronary bypass graft: Secondary | ICD-10-CM

## 2024-03-07 DIAGNOSIS — E440 Moderate protein-calorie malnutrition: Secondary | ICD-10-CM | POA: Insufficient documentation

## 2024-03-07 DIAGNOSIS — E1169 Type 2 diabetes mellitus with other specified complication: Secondary | ICD-10-CM

## 2024-03-07 DIAGNOSIS — I480 Paroxysmal atrial fibrillation: Secondary | ICD-10-CM

## 2024-03-07 HISTORY — PX: IR PATIENT EVAL TECH 0-60 MINS: IMG5564

## 2024-03-07 LAB — FERRITIN: Ferritin: 352 ng/mL — ABNORMAL HIGH (ref 24–336)

## 2024-03-07 LAB — CBC
HCT: 24 % — ABNORMAL LOW (ref 39.0–52.0)
Hemoglobin: 7.1 g/dL — ABNORMAL LOW (ref 13.0–17.0)
MCH: 24.3 pg — ABNORMAL LOW (ref 26.0–34.0)
MCHC: 29.6 g/dL — ABNORMAL LOW (ref 30.0–36.0)
MCV: 82.2 fL (ref 80.0–100.0)
Platelets: 326 10*3/uL (ref 150–400)
RBC: 2.92 MIL/uL — ABNORMAL LOW (ref 4.22–5.81)
RDW: 17.2 % — ABNORMAL HIGH (ref 11.5–15.5)
WBC: 10.4 10*3/uL (ref 4.0–10.5)
nRBC: 0 % (ref 0.0–0.2)

## 2024-03-07 LAB — BASIC METABOLIC PANEL WITH GFR
Anion gap: 7 (ref 5–15)
Anion gap: 6 (ref 5–15)
BUN: 58 mg/dL — ABNORMAL HIGH (ref 8–23)
BUN: 59 mg/dL — ABNORMAL HIGH (ref 8–23)
CO2: 24 mmol/L (ref 22–32)
CO2: 24 mmol/L (ref 22–32)
Calcium: 7.9 mg/dL — ABNORMAL LOW (ref 8.9–10.3)
Calcium: 7.7 mg/dL — ABNORMAL LOW (ref 8.9–10.3)
Chloride: 107 mmol/L (ref 98–111)
Chloride: 108 mmol/L (ref 98–111)
Creatinine, Ser: 1.62 mg/dL — ABNORMAL HIGH (ref 0.61–1.24)
Creatinine, Ser: 1.54 mg/dL — ABNORMAL HIGH (ref 0.61–1.24)
GFR, Estimated: 42 mL/min — ABNORMAL LOW (ref >60)
GFR, Estimated: 45 mL/min — ABNORMAL LOW (ref >60)
Glucose, Bld: 138 mg/dL — ABNORMAL HIGH (ref 70–99)
Glucose, Bld: 128 mg/dL — ABNORMAL HIGH (ref 70–99)
Potassium: 5.5 mmol/L — ABNORMAL HIGH (ref 3.5–5.1)
Potassium: 5 mmol/L (ref 3.5–5.1)
Sodium: 138 mmol/L (ref 135–145)
Sodium: 138 mmol/L (ref 135–145)

## 2024-03-07 LAB — FOLATE: Folate: 10.1 ng/mL (ref >5.9)

## 2024-03-07 LAB — VITAMIN B12: Vitamin B-12: 682 pg/mL (ref 180–914)

## 2024-03-07 LAB — RETICULOCYTES
Immature Retic Fract: 24.3 % — ABNORMAL HIGH (ref 2.3–15.9)
RBC.: 2.71 MIL/uL — ABNORMAL LOW (ref 4.22–5.81)
Retic Count, Absolute: 45.8 10*3/uL (ref 19.0–186.0)
Retic Ct Pct: 1.7 % (ref 0.4–3.1)

## 2024-03-07 LAB — HEMOGLOBIN AND HEMATOCRIT, BLOOD
HCT: 26.3 % — ABNORMAL LOW (ref 39.0–52.0)
Hemoglobin: 7.9 g/dL — ABNORMAL LOW (ref 13.0–17.0)

## 2024-03-07 LAB — IRON AND TIBC
Iron: 24 ug/dL — ABNORMAL LOW (ref 45–182)
Saturation Ratios: 21 % (ref 17.9–39.5)
TIBC: 115 ug/dL — ABNORMAL LOW (ref 250–450)
UIBC: 91 ug/dL

## 2024-03-07 LAB — PROTIME-INR
INR: 1.4 — ABNORMAL HIGH (ref 0.8–1.2)
Prothrombin Time: 18.3 s — ABNORMAL HIGH (ref 11.4–15.2)

## 2024-03-07 LAB — POTASSIUM: Potassium: 4.5 mmol/L (ref 3.5–5.1)

## 2024-03-07 LAB — PREPARE RBC (CROSSMATCH)

## 2024-03-07 MED ORDER — MIDAZOLAM HCL 2 MG/2ML IJ SOLN
INTRAMUSCULAR | Status: AC
  Filled 2024-03-07: qty 2

## 2024-03-07 MED ORDER — DEXTROSE 50 % IV SOLN
1.0000 | Freq: Once | INTRAVENOUS | Status: AC
  Administered 2024-03-07: 50 mL via INTRAVENOUS
  Filled 2024-03-07: qty 50

## 2024-03-07 MED ORDER — CALCIUM GLUCONATE-NACL 1-0.675 GM/50ML-% IV SOLN
1.0000 g | Freq: Once | INTRAVENOUS | Status: AC
  Administered 2024-03-07: 1000 mg via INTRAVENOUS
  Filled 2024-03-07: qty 50

## 2024-03-07 MED ORDER — ADULT MULTIVITAMIN W/MINERALS CH
1.0000 | ORAL_TABLET | Freq: Every day | ORAL | Status: DC
  Administered 2024-03-08 – 2024-03-17 (×10): 1 via ORAL
  Filled 2024-03-07 (×10): qty 1

## 2024-03-07 MED ORDER — SODIUM ZIRCONIUM CYCLOSILICATE 10 G PO PACK
10.0000 g | PACK | Freq: Once | ORAL | Status: DC
  Filled 2024-03-07: qty 1

## 2024-03-07 MED ORDER — LACTATED RINGERS IV BOLUS
500.0000 mL | Freq: Once | INTRAVENOUS | Status: AC
  Administered 2024-03-07: 500 mL via INTRAVENOUS

## 2024-03-07 MED ORDER — LIDOCAINE HCL (PF) 1 % IJ SOLN
10.0000 mL | Freq: Once | INTRAMUSCULAR | Status: AC
  Administered 2024-03-07: 10 mL via INTRADERMAL
  Filled 2024-03-07: qty 10

## 2024-03-07 MED ORDER — FENTANYL CITRATE (PF) 100 MCG/2ML IJ SOLN
INTRAMUSCULAR | Status: AC
  Filled 2024-03-07: qty 2

## 2024-03-07 MED ORDER — FENTANYL CITRATE (PF) 100 MCG/2ML IJ SOLN
INTRAMUSCULAR | Status: AC | PRN
  Administered 2024-03-07 (×2): 50 ug via INTRAVENOUS

## 2024-03-07 MED ORDER — SODIUM CHLORIDE 0.9% IV SOLUTION: Freq: Once | INTRAVENOUS | Status: AC

## 2024-03-07 MED ORDER — MIDAZOLAM HCL 2 MG/2ML IJ SOLN
INTRAMUSCULAR | Status: AC | PRN
Start: 2024-03-07 — End: 2024-03-07
  Administered 2024-03-07: 1 mg via INTRAVENOUS

## 2024-03-07 MED ORDER — MIDODRINE HCL 5 MG PO TABS
5.0000 mg | ORAL_TABLET | Freq: Three times a day (TID) | ORAL | Status: DC
  Administered 2024-03-08: 5 mg via ORAL
  Filled 2024-03-07: qty 1

## 2024-03-07 MED ORDER — ENSURE PLUS HIGH PROTEIN PO LIQD
237.0000 mL | Freq: Three times a day (TID) | ORAL | Status: DC
  Administered 2024-03-10 – 2024-03-17 (×15): 237 mL via ORAL

## 2024-03-07 MED ORDER — INSULIN ASPART 100 UNIT/ML IV SOLN
10.0000 [IU] | Freq: Once | INTRAVENOUS | Status: AC
  Administered 2024-03-07: 10 [IU] via INTRAVENOUS
  Filled 2024-03-07: qty 0.1

## 2024-03-07 MED ORDER — ALBUTEROL SULFATE (2.5 MG/3ML) 0.083% IN NEBU
10.0000 mg | INHALATION_SOLUTION | Freq: Once | RESPIRATORY_TRACT | Status: AC
  Administered 2024-03-07: 10 mg via RESPIRATORY_TRACT
  Filled 2024-03-07: qty 12

## 2024-03-07 MED ORDER — SODIUM CHLORIDE 0.9% FLUSH
5.0000 mL | Freq: Three times a day (TID) | INTRAVENOUS | Status: DC
Start: 2024-03-07 — End: 2024-03-17
  Administered 2024-03-07 – 2024-03-17 (×29): 5 mL

## 2024-03-07 NOTE — Progress Notes (Signed)
 Patient clinically stable post CT Abscess perihepatic 12 FR drain placement per Dr Philip, tolerated well. 450 ml of purulent to dk red drainage removed initially with drain placement. Family in room with return of patient post procedure/recovery with update given at bedside. Report given to Sydelle Ned RN post procedure/226.

## 2024-03-07 NOTE — Progress Notes (Signed)
 Initial Nutrition Assessment  DOCUMENTATION CODES:   Non-severe (moderate) malnutrition in context of chronic illness, Obesity unspecified  INTERVENTION:   -Once diet has advanced, add:   -Ensure Plus High Protein po TID, each supplement provides 350 kcal and 20 grams of protein  -Magic cup TID with meals, each supplement provides 290 kcal and 9 grams of protein  -MVI with minerals daily   NUTRITION DIAGNOSIS:   Moderate Malnutrition related to chronic illness (CHF) as evidenced by mild fat depletion, moderate fat depletion, mild muscle depletion, moderate muscle depletion, energy intake < 75% for > or equal to 1 month.  GOAL:   Patient will meet greater than or equal to 90% of their needs  MONITOR:   PO intake, Supplement acceptance, Diet advancement  REASON FOR ASSESSMENT:   Consult Assessment of nutrition requirement/status  ASSESSMENT:   Pt with history of atrial fibrillation on Eliquis , hypertension, CKD 3a, non-insulin -dependent diabetes mellitus, history of endocarditis with Streptococcus bacteremia, history of E. coli bacteremia, who presents for chief concerns of perihepatic abscess seen on outpatient imaging.  Pt admitted with perihepatic abscess.   Reviewed I/O's: +450 ml x 24 hours  UOP: 250 ml x 24 hours  Per MD notes, pt awaiting IR consult for possible drain placement. Pt NPO in anticipation for procedure.   Spoke with pt and wife at bedside. Pt sleepy and deferred to wife for most of the interview. Wife reports that pt has experienced a general decline in health over the past 3-4 weeks. Pt with declining appetite and subsequent weight loss. Per wife, pt has eaten almost nothing over the past week. Pt wife works hard to provide foods that pt likes and is liberal with his diet so he can get nutrition.   Pt wife has noticed ongoing weight loss and muscle atrophy. Pt wife shares that he was 216# last month at an MD appointment. Reviewed wt hx; pt has  experienced a 3.3% wt loss over the past 3 months, which is not significant for time frame.   Discussed importance of good meal and supplement intake to promote healing. Pt wife amenable to supplements; pt likes Ensure (which he drinks at home) and [employer]).   Medications reviewed and include remeron  and lokelma.   Labs reviewed: CBGS: 203 (inpatient orders for glycemic control are none).    NUTRITION - FOCUSED PHYSICAL EXAM:  Flowsheet Row Most Recent Value  Orbital Region Moderate depletion  Upper Arm Region Moderate depletion  Thoracic and Lumbar Region Mild depletion  Buccal Region Moderate depletion  Temple Region Moderate depletion  Clavicle Bone Region Moderate depletion  Clavicle and Acromion Bone Region Moderate depletion  Scapular Bone Region Moderate depletion  Dorsal Hand Moderate depletion  Patellar Region Mild depletion  Anterior Thigh Region Mild depletion  Posterior Calf Region Mild depletion  Edema (RD Assessment) Mild  Hair Reviewed  Eyes Reviewed  Mouth Reviewed  Skin Reviewed  Nails Reviewed    Diet Order:   Diet Order             Diet NPO time specified Except for: Sips with Meds  Diet effective midnight                   EDUCATION NEEDS:   Education needs have been addressed  Skin:  Skin Assessment: Skin Integrity Issues: Skin Integrity Issues:: Other (Comment) Other: IAD to bilateral buttocks related to moisture  Last BM:  Unknown  Height:   Ht Readings from Last 1 Encounters:  03/06/24 5' 10 (1.778 m)    Weight:   Wt Readings from Last 1 Encounters:  03/06/24 98.1 kg    Ideal Body Weight:  75.5 kg  BMI:  Body mass index is 31.03 kg/m.  Estimated Nutritional Needs:   Kcal:  2050-2250  Protein:  100-115 grams  Fluid:  2.0-2.2 L    Margery ORN, RD, LDN, CDCES Registered Dietitian III Certified Diabetes Care and Education Specialist If unable to reach this RD, please use RD Inpatient group chat on secure chat  between hours of 8am-4 pm daily

## 2024-03-07 NOTE — Assessment & Plan Note (Signed)
-

## 2024-03-07 NOTE — Plan of Care (Signed)

## 2024-03-07 NOTE — Assessment & Plan Note (Signed)
 Hemoglobin decreased to 7.1 this morning, patient appears more lethargic.  No obvious bleeding.  Awaiting IR procedure. Anemia panel consistent with anemia of chronic disease, pending B12 -Ordered 1 unit of PRBC -Monitor hemoglobin -Transfuse below 7

## 2024-03-07 NOTE — Progress Notes (Signed)
 Progress Note   Patient: [name] Young. FMW:[id number] DOB: 04/05/1941 DOA: 03/06/2024     1 DOS: the patient was seen and examined on 03/07/2024   Brief hospital course: Mr. Kaimana Lurz is a 83 year old male with history of atrial fibrillation on Eliquis , hypertension, CKD 3a, non-insulin -dependent diabetes mellitus, history of endocarditis with Streptococcus bacteremia, history of E. coli bacteremia, who presents ED for chief concerns of perihepatic abscess seen on outpatient imaging.  Patient was having right flank and upper back pain for several weeks now.  Vitals in the ED showed t of 98.5, respiration rate of 23, heart rate 74, blood pressure 101/47, SpO2 100% on room air.  Serum sodium is 136, potassium 5.4, chloride 100, bicarb 24, BUN of 63, serum creatinine 1.82, EGFR 37, nonfasting blood glucose 176, WBC 12.6, hemoglobin 8.3, platelets of 432. Initial lactic acid was 2.6 and on repeat is 2.9.  Blood cultures were drawn.  UA was not consistent with UTI.  ED treatment: Cefepime  2 g IV, Flagyl  500 mg IV one-time doses, sodium chloride  500 mg bolus x 2.  IR was consulted to see the possibility of drain placement.  6/27: Blood pressure is soft at 87/45 -500 cc bolus ordered. Preliminary blood cultures negative in 24-hour.  Leukocytosis resolved, decrease of hemoglobin to 7.1 this morning-all cell lines decreased.  Hyperkalemia with potassium at 5.5.  Ordered insulin  with dextrose  and albuterol  nebulizer-patient is currently n.p.o. for procedure.   Assessment and Plan: * Perihepatic abscess (HCC) IR has been consulted Awaiting CT-guided peritoneal/retroperitoneal fluid drain by percutaneous catheter Continue with metronidazole  and cefepime  IV - Follow-up culture results after the procedure  AKI (acute kidney injury) (HCC) AKI suspect secondary to poor p.o. intake in setting of right upper quadrant abdominal pain. Improving creatinine with IV fluid, currently at 1.54,  baseline appears to be around 1.1-1.3. - Continue with gentle IV fluid - Monitor renal function - Keep holding torsemide    Hyperkalemia Potassium initially at 5.5 this morning s/p insulin  with dextrose  and albuterol  and improved to 4.5.  - Continue to monitor - Holding spironolactone    PAF (paroxysmal atrial fibrillation) (HCC) Intermittent heart rate in 40s - Continuing metoprolol  if blood pressure and heart rate allows  Anemia Hemoglobin decreased to 7.1 this morning, patient appears more lethargic.  No obvious bleeding.  Awaiting IR procedure. Anemia panel consistent with anemia of chronic disease, pending B12 -Ordered 1 unit of PRBC -Monitor hemoglobin -Transfuse below 7  S/P CABG x 3 Home rosuvastatin  10 milligrams nightly resumed, metoprolol  tartrate 12.5 mg p.o. twice daily will resume Home aspirin  not resumed on admission in setting of anticipating IR procedure   Essential hypertension Blood pressure borderline soft Continue metoprolol  12.5 mg p.o. twice daily  Keep holding home lisinopril  and spironolactone  Hyperlipidemia associated with type 2 diabetes mellitus (HCC) Rosuvastatin  10 mg daily  Protein-calorie malnutrition, severe (HCC) Registered dietitian has been consulted  General weakness PT/OT evaluation  Insomnia Melatonin nightly as needed for sleep      Subjective: Patient has feeling quite lethargic and weak when seen today.  Continued to have right upper back pain.  No obvious bleeding.  Physical Exam: Vitals:   03/07/24 0932 03/07/24 1040 03/07/24 1142 03/07/24 1211  BP: (!) 95/45 (!) 102/57 (!) 104/50 (!) 102/40  Pulse: 70 (!) 47 (!) 46 (!) 42  Resp: 16 (!) 21 (!) 22 (!) 21  Temp:   98 F (36.7 C) 97.6 F (36.4 C)  TempSrc:   Oral Oral  SpO2: 100% 96% 98% 99%  Weight:      Height:       General.  Frail and ill-appearing gentleman, in no acute distress. Pulmonary.  Lungs clear bilaterally, normal respiratory effort. CV.  Regular  rate and rhythm, no JVD, rub or murmur. Abdomen.  Soft, nontender, nondistended, BS positive. CNS.  Alert and oriented .  No focal neurologic deficit. Extremities.  Trace LE edema, pulses intact and symmetrical.  Data Reviewed: Prior data reviewed  Family Communication: Discussed with wife at bedside  Disposition: Status is: Inpatient Remains inpatient appropriate because: Severity of illness  Planned Discharge Destination: To be determined  Time spent: 50 minutes  This record has been created using Conservation officer, historic buildings. Errors have been sought and corrected,but may not always be located. Such creation errors do not reflect on the standard of care.   Author: Amaryllis Dare, MD 03/07/2024 2:11 PM  For on call review [email or web address].

## 2024-03-07 NOTE — Procedures (Signed)
 Assistance with CT drain placement

## 2024-03-07 NOTE — Care Management Important Message (Signed)
 Important Message  Patient Details  Name: [name] Young. MRN: [id number] Date of Birth: 1941-06-03   Important Message Given:  Yes - Medicare IM     Rojelio SHAUNNA Rattler 03/07/2024, 12:29 PM

## 2024-03-07 NOTE — Consult Note (Signed)
 Chief Complaint:  Perihepatic fluid collection  Procedure: Percutaneous drain placement  Referring Provider(s): Dr. Greig Free  Supervising Physician: Philip Cornet  Patient Status: [facility] - In-pt  History of Present Illness: [name] Young. is a 83 y.o. male with a history of CHF, T2DM, CKD, and A-fib on Eliquis  who presented to the ED on 6/26 with concerns for a perihepatic abscess seen on outpatient imaging. Patient is known to IR for previous placement of drain for similar fluid collection on 12/29/22 with Dr. DELENA Philip. Patient reports right upper abdominal pain for the past several days. CT C/A/P on 6/16 revealed a 7.5cm fluid collection inferior to the liver and subcutaneous edema possibly related to hematoma or infection. In the ED, labs significant for potassium 5.4, BUN 63, Cr 1.82,  WBC 12.6, H/H 8.3/28.2, and lactic 2.6. He was started on IV antibiotics and IR consulted for possible drain placement. Images and case reviewed and approved by Dr. KANDICE Moan.  Patient is resting in bed with his wife at the bedside. Currently getting a breathing treatment to help lower potassium levels. Admits to some right upper abdominal pain, but has otherwise been feeling okay. He denies any shortness of breath, chest pain, fevers/chills, or nausea/vomiting. NPO since midnight. Has been taking ASA 81mg  daily; last dose the morning of 6/26. INR 1.4. All questions and concerns answered at the bedside.   Per patient and wife, patient is to be Full Code for the procedure  Past Medical History:  Diagnosis Date   Adenomatous colon polyp    Aortic atherosclerosis (HCC)    Aortic stenosis    a. 11/2023 Echo: mild-mod AS, mean grad .   Arthritis    Ascending aorta dilatation (HCC)    a.) TTE 11/23/2017: asc Ao measured 37 mm; b.) TTE 10/23/2021: Ao root measured 41 mm, asc Ao measured 38 mm; c.) TEE 10/28/2021: asc Ao measured 38 mm; d.) TTE 01/11/2022: Ao root 40 mm, asc Ao 39 mm   Ascending  cholangitis 10/2021   BPH (benign prostatic hyperplasia)    CKD (chronic kidney disease), stage III (HCC)    Claustrophobia    Coronary artery disease    a.) LHC 12/21/2017: 75% mLAD, 90% D1, 80-99% OM1/2, CTO pRCA (L-R collaterals) --> CVTS consult. b.) 01/2018 CABG x 3 (LIMA->LAD, VG->D1, VG->RPL); c. 01/2024 Cath: LM nl, LAD 20p, 30m, D1 70ost, 90, LCX 30ost, OM1 80, OM2 99- fills from collats from D1. RCA 100p CTO, RPAV 100 (fills via collats from 3rd septal), VG->RPDA nl, LIMA->LAD nl, VG->D1 nl. RHC PA 35/10 (8), PCWP 10, CO 6.5-->Med Rx.   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   In setting of bacteremia from ascending colangitis   GERD (gastroesophageal reflux disease)    Hemothorax on right 11/2023   HFrEF (heart failure with reduced ejection fraction) (HCC)    a.) TTE 11/23/2017: EF 50-55%, mod MAC, triv TR, G1DD; b.) TTE 10/23/2021: EF 40-45%, mild LAE, Ao sclerosis, triv MR, G2DD; c.) TEE 10/28/2021: EF 40-45%, glob HK, mobile vegitation on MV; d.) TTE 01/11/2022: EF 50-55%, mild LVH, RVE, Ao sclerosis, mild MR/AR, G1DD   History of cholelithiasis    History of kidney stones    ca ox [place] @ [facility]) now [place]   History of pneumonia    HLD (hyperlipidemia)    HTN (hypertension)    Ischemic cardiomyopathy    a. 12/2022 Echo: EF 50-55%; b. 11/2023 Echo: EF 40-45%, glob  HK, mild LVH, GrI DD, nl RV fxn, mild MR, mild AI, mild-mod AS, Ao root 43mm.   Jaundice    age 55   Long term current use of anticoagulant    a.) apixaban    Paroxysmal atrial fibrillation (HCC)    a.) CHA2DS2VASc = 6 (age x 2, HFrEF, HTN, vascular disease history, T2DM);  b.) rate/rhythm maintained on oral metoprolol  succinate; chronically anticoagulated with apixaban    Pleural effusion    a. 11/2023 CT chest: Large R ple effusion, partially loculated-->s/p pleurx.   Pneumonia    Right-sided carotid artery disease (HCC)    a.) carotid doppler 04/05/2022: 1-39% RICA   S/P CABG x 3     a.) LIMA-LAD, SVG-diagonal, SVG-PL branch of RCA   S/P cataract extraction and insertion of intraocular lens    T2DM (type 2 diabetes mellitus) (HCC) 2010    Past Surgical History:  Procedure Laterality Date   CARPAL TUNNEL RELEASE Bilateral    CATARACT EXTRACTION, BILATERAL     COLONOSCOPY  11/2012   11 adenomatous polyps, diverticulosis, rec rpt 1 yr [facility])   COLONOSCOPY  12/2013   3 polyps, diverticulosis, rec rpt 3 yrs [facility])   COLONOSCOPY  06/2019   6 polyps (TA), diverticulosis, f/u left open ended [facility])   CORONARY ARTERY BYPASS GRAFT N/A 01/18/2018   Procedure: CORONARY ARTERY BYPASS GRAFTING (CABG) x 3; Using Left Internal Mammary Artery, and Right Greater Saphenous Vein harvested Endoscopically, Coronary Artery Endarterectomy;  Surgeon: [name] Hanford Coy, MD;  Location: [facility] OR;  Service: Open Heart Surgery;  Laterality: N/A;   ERCP N/A 10/23/2021   Procedure: ENDOSCOPIC RETROGRADE CHOLANGIOPANCREATOGRAPHY (ERCP);  Surgeon: Aneita Gwendlyn DASEN, MD;  Location: [facility] ENDOSCOPY;  Service: Endoscopy;  Laterality: N/A;   IR CATHETER TUBE CHANGE  01/08/2023   IR PERC PLEURAL DRAIN W/INDWELL CATH W/IMG GUIDE  11/20/2023   IR RADIOLOGIST EVAL & MGMT  01/23/2023   IR THORACENTESIS ASP PLEURAL SPACE W/IMG GUIDE  12/29/2022   KNEE CARTILAGE SURGERY Left    LEFT HEART CATH AND CORONARY ANGIOGRAPHY N/A 12/21/2017   Procedure: LEFT HEART CATH AND CORONARY ANGIOGRAPHY;  Surgeon: Mady Bruckner, MD;  Location: [facility] INVASIVE CV LAB;  Service: Cardiovascular;  Laterality: N/A;   LITHOTRIPSY     REMOVAL OF STONES  10/23/2021   Procedure: REMOVAL OF STONES;  Surgeon: Aneita Gwendlyn DASEN, MD;  Location: [facility] ENDOSCOPY;  Service: Endoscopy;;   RIGHT/LEFT HEART CATH AND CORONARY/GRAFT ANGIOGRAPHY Bilateral 01/25/2024   Procedure: RIGHT/LEFT HEART CATH AND CORONARY/GRAFT ANGIOGRAPHY;  Surgeon: Mady Bruckner, MD;  Location: [facility] INVASIVE CV LAB;  Service: Cardiovascular;  Laterality: Bilateral;   SPHINCTEROTOMY   10/23/2021   Procedure: SPHINCTEROTOMY;  Surgeon: Aneita Gwendlyn DASEN, MD;  Location: [facility] ENDOSCOPY;  Service: Endoscopy;;   TEE WITHOUT CARDIOVERSION N/A 01/18/2018   Procedure: TRANSESOPHAGEAL ECHOCARDIOGRAM (TEE);  Surgeon: [name] Hanford, Coy, MD;  Location: [facility] OR;  Service: Open Heart Surgery;  Laterality: N/A;   TEE WITHOUT CARDIOVERSION N/A 10/28/2021   Procedure: TRANSESOPHAGEAL ECHOCARDIOGRAM (TEE);  Surgeon: Mona Vinie BROCKS, MD;  Location: [facility] ENDOSCOPY;  Service: Cardiovascular;  Laterality: N/A;   UMBILICAL HERNIA REPAIR     with mesh    Allergies: Patient has no known allergies.  Medications: Prior to Admission medications   Medication Sig Start Date End Date Taking? Authorizing Provider  aspirin  EC 81 MG tablet Take 81 mg by mouth daily. Swallow whole.   Yes [provider]  bisacodyl  (DULCOLAX) 5 MG EC tablet Take 10 mg by mouth at  bedtime.   Yes [provider]  Cholecalciferol  (VITAMIN D3) 25 MCG (1000 UT) CAPS Take 1 capsule (1,000 Units total) by mouth daily. 05/24/20  Yes Rilla Baller, MD  Cyanocobalamin  (B-12) 1000 MCG CAPS Take 1 capsule by mouth every Monday, Wednesday, and Friday. 07/16/23  Yes Rilla Baller, MD  feeding supplement (ENSURE ENLIVE / ENSURE PLUS) LIQD Take 237 mLs by mouth 2 (two) times daily between meals. Patient taking differently: Take 237 mLs by mouth daily. 01/05/23  Yes Wieting, Richard, MD  ferrous sulfate  325 (65 FE) MG EC tablet Take 1 tablet (325 mg total) by mouth daily. 01/09/24  Yes End, Lonni, MD  lisinopril  (ZESTRIL ) 2.5 MG tablet Take 2.5 mg by mouth daily. 12/19/23 12/18/24 Yes [provider]  Magnesium  250 MG TABS Take 250 mg by mouth daily.   Yes [provider]  metoprolol  tartrate (LOPRESSOR ) 25 MG tablet Take 0.5 tablets (12.5 mg total) by mouth 2 (two) times daily. 12/10/23  Yes Rilla Baller, MD  mirtazapine  (REMERON ) 30 MG tablet Take 1 tablet (30 mg total) by mouth at bedtime. 12/31/23  Yes  Rilla Baller, MD  Multiple Vitamins-Minerals (PRESERVISION AREDS 2 PO) Take 1 capsule by mouth daily.   Yes [provider]  polyethylene glycol powder (GLYCOLAX /MIRALAX ) 17 GM/SCOOP powder Take 8.5-17 g by mouth daily. Patient taking differently: Take 8.5-17 g by mouth daily as needed for moderate constipation. 12/10/23  Yes Rilla Baller, MD  rosuvastatin  (CRESTOR ) 10 MG tablet TAKE 1 TABLET BY MOUTH EVERY DAY 12/19/23  Yes Vivienne Lonni Ingle, NP  spironolactone (ALDACTONE) 25 MG tablet Take 25 mg by mouth daily. 12/19/23 03/18/24 Yes [provider]  tamsulosin  (FLOMAX ) 0.4 MG CAPS capsule Take 1 capsule (0.4 mg total) by mouth daily. 07/16/23  Yes Rilla Baller, MD  torsemide  (DEMADEX ) 20 MG tablet Take 1 tablet (20 mg total) by mouth every other day. 01/25/24 04/24/24 Yes End, Lonni, MD  ELIQUIS  5 MG TABS tablet TAKE 1 TABLET BY MOUTH TWICE A DAY Patient not taking: Reported on 02/08/2024 09/19/23   End, Lonni, MD  glucose blood (TRUE METRIX BLOOD GLUCOSE TEST) test strip Use as instructed to check blood sugar once a day 03/06/23   Rilla Baller, MD     Family History  Problem Relation Age of Onset   Alzheimer's disease Mother    Breast cancer Mother        breast   Atrial fibrillation Mother    CAD Mother    Stroke Father 34   Diabetes Father    CAD Father    Colon polyps Sister    Colon polyps Brother    Rectal cancer Maternal Grandfather        rectal   Colon cancer Maternal Grandfather 88   Esophageal cancer Neg Hx    Stomach cancer Neg Hx     Social History   Socioeconomic History   Marital status: Married    Spouse name: [name]   Number of children: Not on file   Years of education: Not on file   Highest education level: 12th grade  Occupational History   Not on file  Tobacco Use   Smoking status: Never    Passive exposure: Past   Smokeless tobacco: Former    Types: Chew    Quit date: 09/08/2001  Vaping Use    Vaping status: Never Used  Substance and Sexual Activity   Alcohol use: Not Currently    Comment: occ   Drug use: No  Sexual activity: Not Currently  Other Topics Concern   Not on file  Social History Narrative   Caffeine: occasional Lives with wife, grandson Everette 2009)Occupation: retired, 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.Diet: some water, fruits/vegetables daily, avoids potatoes   Social Drivers of Health   Financial Resource Strain: Low Risk  (12/15/2023)   Received from [facility]   Overall Financial Resource Strain (CARDIA)    Difficulty of Paying Living Expenses: Not hard at all  Food Insecurity: Unknown (03/06/2024)   Hunger Vital Sign    Worried About Running Out of Food in the Last Year: Patient declined    Ran Out of Food in the Last Year: Never true  Transportation Needs: No Transportation Needs (03/06/2024)   PRAPARE - Administrator, Civil Service (Medical): No    Lack of Transportation (Non-Medical): No  Physical Activity: Unknown (10/08/2023)   Exercise Vital Sign    Days of Exercise per Week: 0 days    Minutes of Exercise per Session: Not on file  Stress: Patient Declined (10/08/2023)   [employer] of Occupational Health - Occupational Stress Questionnaire    Feeling of Stress : Patient declined  Social Connections: Moderately Isolated (03/06/2024)   Social Connection and Isolation Panel    Frequency of Communication with Friends and Family: Never    Frequency of Social Gatherings with Friends and Family: Once a week    Attends Religious Services: More than 4 times per year    Active Member of [employer] or Organizations: No    Attends Banker Meetings: Never    Marital Status: Married     Review of Systems  Gastrointestinal:  Positive for abdominal pain (RUQ).  Patient denies any headache, chest pain, shortness of breath, N/V, or fever/chills. All other systems are negative.   Vital  Signs: BP (!) 102/40   Pulse (!) 42   Temp 97.6 F (36.4 C) (Oral)   Resp (!) 21   Ht 5' 10 (1.778 m)   Wt 216 lb 4.3 oz (98.1 kg)   SpO2 99%   BMI 31.03 kg/m   Advance Care Plan: The advanced care plan/surrogate decision maker was discussed at the time of visit and documented in the medical record.    Physical Exam Vitals reviewed.  Constitutional:      Appearance: Normal appearance.  HENT:     Head: Normocephalic and atraumatic.     Mouth/Throat:     Mouth: Mucous membranes are moist.     Pharynx: Oropharynx is clear.   Cardiovascular:     Rate and Rhythm: Normal rate and regular rhythm.  Pulmonary:     Effort: Pulmonary effort is normal.     Breath sounds: Normal breath sounds.  Abdominal:     General: Abdomen is flat.     Palpations: Abdomen is soft.     Tenderness: There is abdominal tenderness (RUQ/right flank).   Musculoskeletal:        General: Normal range of motion.     Cervical back: Normal range of motion.   Skin:    General: Skin is warm and dry.   Neurological:     General: No focal deficit present.     Mental Status: He is alert and oriented to person, place, and time.   Psychiatric:        Mood and Affect: Mood normal.        Behavior: Behavior normal.     Imaging: CT  ABDOMEN PELVIS W CONTRAST Result Date: 03/06/2024 CLINICAL DATA:  Perihepatic abscess. EXAM: CT ABDOMEN AND PELVIS WITH CONTRAST TECHNIQUE: Multidetector CT imaging of the abdomen and pelvis was performed using the standard protocol following bolus administration of intravenous contrast. RADIATION DOSE REDUCTION: This exam was performed according to the departmental dose-optimization program which includes automated exposure control, adjustment of the mA and/or kV according to patient size and/or use of iterative reconstruction technique. CONTRAST:  75mL OMNIPAQUE  IOHEXOL  300 MG/ML  SOLN COMPARISON:  CT chest and pelvis dated 02/25/2024. FINDINGS: Lower chest: Similar appearance of  small right pleural effusion and right posterior pleural thickening or mass. Additional subpleural nodule in the right middle lobe similar to prior CT. Coronary vascular calcification. No intra-abdominal free air.  Small perihepatic free fluid. Hepatobiliary: Complex collection extending from the subcapsular posterior right lobe of the liver into the peritoneal surface posteriorly as well as posterolateral abdominal wall measures approximately 9.6 x 5.4 cm in greatest axial dimensions posterior to the liver and 14 x 7 cm in the right lateral abdominal wall and overall slightly increased in size since the prior CT. No biliary dilatation. Cholecystectomy. Pancreas: Unremarkable. No pancreatic ductal dilatation or surrounding inflammatory changes. Spleen: Normal in size without focal abnormality. Adrenals/Urinary Tract: The adrenal glands unremarkable. Right renal nonobstructing calculi measure up to 5 mm in the interpolar right kidney. No hydronephrosis. Bilateral renal cysts. The visualized ureters and urinary bladder appear unremarkable. Stomach/Bowel: Small hiatal hernia. There is moderate stool throughout the colon. There is sigmoid diverticulosis. There is no bowel obstruction or active inflammation. The appendix is not visualized with certainty. No inflammatory changes identified in the right lower quadrant. Vascular/Lymphatic: Moderate aortoiliac atherosclerotic disease. The IVC is unremarkable. No portal venous gas. There is no adenopathy. Reproductive: The prostate and seminal vesicles are grossly unremarkable. Other: Small fat containing bilateral inguinal hernia. Musculoskeletal: Osteopenia with degenerative changes of the spine. No acute osseous pathology. IMPRESSION: 1. Slightly increased size of the complex collection extending from the subcapsular posterior right lobe of the liver into the posterior peritoneal surface and posterolateral abdominal wall. 2. Similar appearance of small right pleural  effusion and right posterior pleural thickening or mass. 3. Sigmoid diverticulosis. No bowel obstruction. 4. Right renal nonobstructing calculi. No hydronephrosis. 5.  Aortic Atherosclerosis (ICD10-I70.0). Electronically Signed   By: Vanetta Chou M.D.   On: 03/06/2024 14:34   DG Chest Port 1 View Result Date: 03/06/2024 CLINICAL DATA:  Sepsis. EXAM: PORTABLE CHEST 1 VIEW COMPARISON:  December 06, 2023. FINDINGS: The heart size and mediastinal contours are within normal limits. Status post coronary artery bypass graft. Both lungs are clear. The visualized skeletal structures are unremarkable. IMPRESSION: No active disease. Electronically Signed   By: Lynwood Landy Raddle M.D.   On: 03/06/2024 12:30   LONG TERM MONITOR (3-14 DAYS) Result Date: 03/05/2024   The patient was monitored for 7 days, 14 hours.   The predominant rhythm was sinus with an average rate of 85 bpm (range 58-120 bpm in sinus).   There were rare PACs and frequent PVCs (PVC burden ~30%).   917 episodes of nonsustained ventricular tachycardia were observed, lasting up to 10 beats with a maximum rate of 218 bpm.   102 supraventricular runs were observed, lasting up to 15 beats with a maximum rate of 160 bpm.   No sustained arrhythmia or prolonged pause was observed.   There were no patient triggered events. Predominantly sinus rhythm with frequent PVCs and numerous episodes of nonsustained ventricular tachycardia  and brief supraventricular runs, as outlined above.  Consider electrophysiology consultation.   CT CHEST ABDOMEN PELVIS WO CONTRAST Result Date: 03/03/2024 CLINICAL DATA:  Congestive heart failure. Fluid retention and urinary frequency. EXAM: CT CHEST, ABDOMEN AND PELVIS WITHOUT CONTRAST TECHNIQUE: Multidetector CT imaging of the chest, abdomen and pelvis was performed following the standard protocol without IV contrast. RADIATION DOSE REDUCTION: This exam was performed according to the departmental dose-optimization program which  includes automated exposure control, adjustment of the mA and/or kV according to patient size and/or use of iterative reconstruction technique. COMPARISON:  CT of the chest 11/23/2023. CT abdomen and pelvis 01/23/2023. FINDINGS: CT CHEST FINDINGS Cardiovascular: Heart is mildly enlarged. Aorta is normal in size. There is no pericardial effusion. There are atherosclerotic calcifications of the aorta and coronary arteries. Patient is status post cardiac surgery. Mediastinum/Nodes: No enlarged mediastinal, hilar, or axillary lymph nodes. Thyroid  gland, trachea, and esophagus demonstrate no significant findings. Lungs/Pleura: There is a small hiatal hernia. Right-sided pleural drain has been removed in the interval. Small loculated right pleural effusion has decreased in size. Hyperdense posterior right pleural mass has decreased in size now measuring up to 2.8 cm in thickness (previously 4.3 cm). There is no new focal lung infiltrate, pleural effusion or pneumothorax. Musculoskeletal: No acute fractures are seen. Sternotomy are present. Bilateral gynecomastia. CT ABDOMEN PELVIS FINDINGS Hepatobiliary: Subcapsular fluid collection adjacent to the inferior right hepatic lobe also abuts the peritoneal surface. Is measures 6.9 x 7.5 x 4.4 cm and appears new from the prior study. Second tiny subcapsular fluid collection is seen measuring up to 4 mm in thickness image 2/58. Gallbladder surgically absent. No biliary ductal dilatation identified. Pancreas: Unremarkable. No pancreatic ductal dilatation or surrounding inflammatory changes. Spleen: Normal in size without focal abnormality. Adrenals/Urinary Tract: There are 2 right renal calculi measuring up to 3 mm. Bilateral renal cysts are present measuring up to 4 cm. No hydronephrosis. Adrenal glands and bladder are within normal limits. Stomach/Bowel: Stomach is within normal limits. Appendix appears normal. No evidence of bowel wall thickening, distention, or inflammatory  changes. There is sigmoid and descending colon diverticulosis. The appendix is not visualized. Vascular/Lymphatic: Aortic atherosclerosis. No enlarged abdominal or pelvic lymph nodes. Reproductive: Prostate gland is enlarged. Other: There is no ascites. There are small fat containing inguinal hernias. There is intramuscular wall thickening and overlying subcutaneous edema at the level of the right eleventh and twelfth rib adjacent to the right liver subcapsular fluid collection. This area measures approximately 5 x 13 by 9.7 cm. Musculoskeletal: No osseous erosion or acute fracture. IMPRESSION: 1. Interval removal of right-sided pleural drain with decrease in size of small loculated right pleural effusion. 2. Interval decrease in size of hyperdense posterior right pleural mass. 3. New subcapsular fluid collection adjacent to the inferior right hepatic lobe measuring up to 7.5 cm. Abscess is a possibility. Second tiny subcapsular fluid collection is seen. 4. Intramuscular wall thickening and overlying subcutaneous edema at the level of the right eleventh and twelfth rib adjacent to the right liver subcapsular fluid collection. Findings may be related to hematoma, infection, or neoplasm. 5. Nonobstructing right renal calculi. 6. Colonic diverticulosis. 7. Prostatomegaly. 8. Aortic atherosclerosis. Aortic Atherosclerosis (ICD10-I70.0). Electronically Signed   By: Greig Pique M.D.   On: 03/03/2024 23:19    Labs:  CBC: Recent Labs    12/31/23 1230 01/22/24 1108 01/25/24 1013 01/25/24 1018 03/06/24 1122 03/07/24 0508  WBC 7.6 8.9  --   --  12.6* 10.4  HGB 8.6 Repeated  and verified X2.* 8.9* 8.2* 8.2* 8.3* 7.1*  HCT 27.3* 30.4* 24.0* 24.0* 28.2* 24.0*  PLT 409.0* 373  --   --  432* 326    COAGS: Recent Labs    11/21/23 0902 03/07/24 0508  INR 1.4* 1.4*    BMP: Recent Labs    11/29/23 0547 12/06/23 1529 01/22/24 1108 01/25/24 1013 01/25/24 1018 03/06/24 1122 03/07/24 0508  03/07/24 0921 03/07/24 1156  NA 139   < > 147*   < > 137 136 138 138  --   K 3.9   < > 5.6*   < > 4.5 5.4* 5.5* 5.0 4.5  CL 94*   < > 103  --   --  100 107 108  --   CO2 33*   < > 24  --   --  24 24 24   --   GLUCOSE 173*   < > 189*  --   --  176* 138* 128*  --   BUN 56*   < > 35*  --   --  63* 58* 59*  --   CALCIUM  8.0*   < > 8.8  --   --  8.5* 7.9* 7.7*  --   CREATININE 1.32*   < > 1.39*  --   --  1.82* 1.62* 1.54*  --   GFRNONAA 54*  --   --   --   --  37* 42* 45*  --    < > = values in this interval not displayed.    LIVER FUNCTION TESTS: Recent Labs    07/09/23 0742 09/17/23 1427 11/13/23 1034 11/20/23 1220 11/21/23 0239 11/22/23 0330 11/25/23 0443 12/31/23 1238 03/06/24 1122  BILITOT 0.6 0.4  --  0.6  --   --   --   --  0.6  AST 18 28  --  37  --   --   --   --  42*  ALT 23 25  --  38  --   --   --   --  35  ALKPHOS 58 87  --  68  --   --   --   --  109  PROT 6.8 6.6  --  7.0  --   --   --   --  7.8  ALBUMIN  3.8 3.6*   < > 2.4*   < > 2.1* 1.9* 3.0* 2.2*   < > = values in this interval not displayed.    TUMOR MARKERS: No results for input(s): AFPTM, CEA, CA199, CHROMGRNA in the last 8760 hours.  Assessment and Plan:  Perihepatic fluid collection: Yasseen Salls. is a 83 y.o. male with a history of CHF, CKD, T2DM, A-fib on Eliquis , and previous perihepatic fluid collection s/p drain placement on 12/29/22 with Dr. DELENA Balder who presented to [facility] for concern of new perihepatic abscess seen on outpatient imaging. IR consulted for percutaneous drain placement.   -Labs improving -Continues to have RUQ pain -Plan for percutaneous drain placement with Dr. DELENA Balder -Consent in IR -Continue IV antibiotics and medical management per primary   Risks and benefits discussed with the patient including bleeding, infection, damage to adjacent structures, bowel perforation/fistula connection, and sepsis.  All of the patient's questions were answered, patient is agreeable  to proceed. Consent signed and in chart.   Thank you for allowing our service to participate in Bland Rudzinski. 's care.    Electronically Signed: Rosaisela Jamroz M Mattias Walmsley, PA-C   03/07/2024,  1:10 PM     I spent a total of 40 Minutes in face to face in clinical consultation, greater than 50% of which was counseling/coordinating care for percutaneous drain placement.

## 2024-03-07 NOTE — Procedures (Signed)
 Interventional Radiology Procedure:   Indications: Right perihepatic / abdominal wall abscess  Procedure: CT and US  guided drainage of abdominal wall abscess / infected hematoma  Findings: Large complex collection along right lateral abdominal wall and perihepatic space.  12 Fr drain placed in superficial component and appeared to be connected to perihepatic component.  450 ml of bloody purulent fluid was removed.  Residual hematoma at end of procedure.   Complications: None     EBL: Minimal  Plan: Fluid sent for culture.  Routine drain flushing.    Ardene Remley R. Philip, MD  Pager: [phone]

## 2024-03-07 NOTE — TOC Initial Note (Signed)
 Transition of Care (TOC) - Initial/Assessment Note    Patient Details  Name: [name] Young. MRN: [id number] Date of Birth: 05-24-1941  Transition of Care [facility]) CM/SW Contact:    [name] JAYSON Carpen, LCSW Phone Number: 03/07/2024, 3:16 PM  Clinical Narrative:   Readmission prevention screen complete. CSW met with patient. Wife at bedside. CSW introduced role and explained that discharge planning would be discussed. PCP is [name] Blas, MD. Wife drives him to appointments. [facility] [address] No issues obtaining medications. Patient lives home with wife. No home health prior to admission. He has a RW, BSC, and shower chair at home. Wife is requesting a wheelchair at discharge. No further concerns. CSW will continue to follow patient and his wife for support and facilitate return home once stable. Wife will transport him home at discharge.               Expected Discharge Plan: Home/Self Care Barriers to Discharge: Continued Medical Work up   Patient Goals and CMS Choice            Expected Discharge Plan and Services     Post Acute Care Choice: Durable Medical Equipment Living arrangements for the past 2 months: Single Family Home                                      Prior Living Arrangements/Services Living arrangements for the past 2 months: Single Family Home Lives with:: Spouse Patient language and need for interpreter reviewed:: Yes Do you feel safe going back to the place where you live?: Yes      Need for Family Participation in Patient Care: Yes (Comment) Care giver support system in place?: Yes (comment) Current home services: DME Criminal Activity/Legal Involvement Pertinent to Current Situation/Hospitalization: No - Comment as needed  Activities of Daily Living   ADL Screening (condition at time of admission) Independently performs ADLs?: Yes (appropriate for developmental age) Is the patient deaf or have difficulty hearing?:  No Does the patient have difficulty seeing, even when wearing glasses/contacts?: No Does the patient have difficulty concentrating, remembering, or making decisions?: No  Permission Sought/Granted Permission sought to share information with : Family Supports    Share Information with NAME: [name] Young     Permission granted to share info w Relationship: Wife  Permission granted to share info w Contact Information: [phone]  Emotional Assessment Appearance:: Appears stated age Attitude/Demeanor/Rapport: Engaged, Gracious Affect (typically observed): Accepting, Appropriate, Calm, Pleasant Orientation: : Oriented to Self, Oriented to Place, Oriented to  Time, Oriented to Situation Alcohol / Substance Use: Not Applicable Psych Involvement: No (comment)  Admission diagnosis:  Perihepatic abscess (HCC) [K65.0] Patient Active Problem List   Diagnosis Date Noted   Malnutrition of moderate degree 03/07/2024   Pressure injury of skin 03/07/2024   Perihepatic abscess (HCC) 03/06/2024   Essential hypertension 03/06/2024   Hyperkalemia 03/06/2024   AKI (acute kidney injury) (HCC) 03/06/2024   Fall at home, initial encounter 02/11/2024   Abdominal right lower quadrant swelling 02/11/2024   Hemothorax on right 11/20/2023   Bradycardia 04/25/2023   General weakness 04/25/2023   Chronic heart failure with mildly reduced ejection fraction (HFmrEF, 41-49%) (HCC) 03/06/2023   Frequent PVCs 03/06/2023   Insomnia 03/05/2023   Decubitus ulcer of right buttock, stage 1 01/04/2023   Aortic valve stenosis 01/02/2023   History of endocarditis 12/28/2022   Hepatic abscess  12/28/2022   Protein-calorie malnutrition, severe (HCC) 12/28/2022   Abnormal TSH 12/27/2022   Recurrent pleural effusion on right 12/21/2022   Pre-op evaluation 05/03/2022   CCC (chronic calculous cholecystitis) 04/25/2022   History of acute cholangitis 04/25/2022   Palliative care by specialist    Bacteremia due to  Streptococcus 10/24/2021   Vitamin B12 deficiency 05/24/2020   Vitamin D  deficiency 05/24/2020   Early dry stage nonexudative age-related macular degeneration of both eyes 12/01/2019   Pain due to onychomycosis of toenails of both feet 07/28/2019   Diabetic neuropathy (HCC) 07/28/2019   Hearing loss, right 05/22/2019   PAF (paroxysmal atrial fibrillation) (HCC) 02/08/2018   FTT (failure to thrive) in adult 01/28/2018   Anemia    Constipation    S/P CABG x 3 01/18/2018   Coronary artery disease 12/29/2017   Chronic inactive rheumatic heart disease 11/24/2017   SOB (shortness of breath) 11/05/2017   DNR (do not resuscitate) 06/14/2017   Peripheral neuropathy 06/14/2017   Health maintenance examination 12/12/2016   Obesity, Class I, BMI 30-34.9 12/12/2016   Advanced care planning/counseling discussion 10/30/2014   Benign prostatic hyperplasia 10/30/2014   CKD stage 3 due to type 2 diabetes mellitus (HCC) 07/08/2013   Medicare annual wellness visit, subsequent 10/11/2011   Type 2 diabetes mellitus with other specified complication (HCC)    Hyperlipidemia associated with type 2 diabetes mellitus (HCC)    History of kidney stones    GERD (gastroesophageal reflux disease)    PCP:  [name] Manna, MD [phone] [name] Young, [name] Young - [address] [name] [address] Phone: [phone] Fax: [phone]     Social Drivers of Health (SDOH) Social History: SDOH Screenings   Food Insecurity: Unknown (03/06/2024)  Housing: Low Risk  (03/06/2024)  Transportation Needs: No Transportation Needs (03/06/2024)  Utilities: Not At Risk (03/06/2024)  Depression (PHQ2-9): Low Risk  (12/31/2023)  Financial Resource Strain: Low Risk  (12/15/2023)   Received from [facility]  Physical Activity: Unknown (10/08/2023)  Social Connections: Moderately Isolated (03/06/2024)  Stress: Patient Declined (10/08/2023)  Tobacco Use: Medium Risk  (02/14/2024)   Received from [facility]   SDOH Interventions:     Readmission Risk Interventions    03/07/2024    3:03 PM  Readmission Risk Prevention Plan  Transportation Screening Complete  PCP or Specialist Appt within 3-5 Days Complete  Social Work Consult for Recovery Care Planning/Counseling Complete  Palliative Care Screening Not Applicable  Medication Review Oceanographer) Complete

## 2024-03-08 ENCOUNTER — Inpatient Hospital Stay

## 2024-03-08 DIAGNOSIS — D649 Anemia, unspecified: Secondary | ICD-10-CM | POA: Diagnosis not present

## 2024-03-08 DIAGNOSIS — E875 Hyperkalemia: Secondary | ICD-10-CM | POA: Diagnosis not present

## 2024-03-08 DIAGNOSIS — N179 Acute kidney failure, unspecified: Secondary | ICD-10-CM | POA: Diagnosis not present

## 2024-03-08 DIAGNOSIS — K650 Generalized (acute) peritonitis: Secondary | ICD-10-CM | POA: Diagnosis not present

## 2024-03-08 LAB — COMPREHENSIVE METABOLIC PANEL WITH GFR
ALT: 20 U/L (ref 0–44)
AST: 18 U/L (ref 15–41)
Albumin: 1.6 g/dL — ABNORMAL LOW (ref 3.5–5.0)
Alkaline Phosphatase: 76 U/L (ref 38–126)
Anion gap: 7 (ref 5–15)
BUN: 54 mg/dL — ABNORMAL HIGH (ref 8–23)
CO2: 24 mmol/L (ref 22–32)
Calcium: 8 mg/dL — ABNORMAL LOW (ref 8.9–10.3)
Chloride: 109 mmol/L (ref 98–111)
Creatinine, Ser: 1.41 mg/dL — ABNORMAL HIGH (ref 0.61–1.24)
GFR, Estimated: 50 mL/min — ABNORMAL LOW (ref >60)
Glucose, Bld: 112 mg/dL — ABNORMAL HIGH (ref 70–99)
Potassium: 5 mmol/L (ref 3.5–5.1)
Sodium: 140 mmol/L (ref 135–145)
Total Bilirubin: 0.7 mg/dL (ref 0.0–1.2)
Total Protein: 5.7 g/dL — ABNORMAL LOW (ref 6.5–8.1)

## 2024-03-08 LAB — BLOOD CULTURE ID PANEL (REFLEXED) - BCID2
A.calcoaceticus-baumannii: NOT DETECTED
Bacteroides fragilis: NOT DETECTED
Candida albicans: NOT DETECTED
Candida auris: NOT DETECTED
Candida glabrata: NOT DETECTED
Candida krusei: NOT DETECTED
Candida parapsilosis: NOT DETECTED
Candida tropicalis: NOT DETECTED
Cryptococcus neoformans/gattii: NOT DETECTED
Enterobacter cloacae complex: NOT DETECTED
Enterobacterales: NOT DETECTED
Enterococcus Faecium: NOT DETECTED
Enterococcus faecalis: DETECTED — AB
Escherichia coli: NOT DETECTED
Haemophilus influenzae: NOT DETECTED
Klebsiella aerogenes: NOT DETECTED
Klebsiella oxytoca: NOT DETECTED
Klebsiella pneumoniae: NOT DETECTED
Listeria monocytogenes: NOT DETECTED
Neisseria meningitidis: NOT DETECTED
Proteus species: NOT DETECTED
Pseudomonas aeruginosa: NOT DETECTED
Salmonella species: NOT DETECTED
Serratia marcescens: NOT DETECTED
Staphylococcus aureus (BCID): NOT DETECTED
Staphylococcus epidermidis: NOT DETECTED
Staphylococcus lugdunensis: NOT DETECTED
Staphylococcus species: NOT DETECTED
Stenotrophomonas maltophilia: NOT DETECTED
Streptococcus agalactiae: NOT DETECTED
Streptococcus pneumoniae: NOT DETECTED
Streptococcus pyogenes: NOT DETECTED
Streptococcus species: NOT DETECTED
Vancomycin resistance: DETECTED — AB

## 2024-03-08 LAB — CBC
HCT: 24.9 % — ABNORMAL LOW (ref 39.0–52.0)
Hemoglobin: 7.4 g/dL — ABNORMAL LOW (ref 13.0–17.0)
MCH: 24.3 pg — ABNORMAL LOW (ref 26.0–34.0)
MCHC: 29.7 g/dL — ABNORMAL LOW (ref 30.0–36.0)
MCV: 81.6 fL (ref 80.0–100.0)
Platelets: 309 10*3/uL (ref 150–400)
RBC: 3.05 MIL/uL — ABNORMAL LOW (ref 4.22–5.81)
RDW: 17.7 % — ABNORMAL HIGH (ref 11.5–15.5)
WBC: 7.3 10*3/uL (ref 4.0–10.5)
nRBC: 0 % (ref 0.0–0.2)

## 2024-03-08 LAB — CK: Total CK: 19 U/L — ABNORMAL LOW (ref 49–397)

## 2024-03-08 LAB — HEMOGLOBIN AND HEMATOCRIT, BLOOD
HCT: 28.7 % — ABNORMAL LOW (ref 39.0–52.0)
Hemoglobin: 8.7 g/dL — ABNORMAL LOW (ref 13.0–17.0)

## 2024-03-08 MED ORDER — SODIUM CHLORIDE 0.9% IV SOLUTION: Freq: Once | INTRAVENOUS | Status: AC

## 2024-03-08 MED ORDER — POLYETHYLENE GLYCOL 3350 17 G PO PACK
17.0000 g | PACK | Freq: Every day | ORAL | Status: DC
  Administered 2024-03-08 – 2024-03-10 (×2): 17 g via ORAL
  Filled 2024-03-08 (×3): qty 1

## 2024-03-08 MED ORDER — SODIUM CHLORIDE 0.9 % IV SOLN
1000.0000 mg | Freq: Every day | INTRAVENOUS | Status: DC
  Administered 2024-03-08 – 2024-03-10 (×3): 1000 mg via INTRAVENOUS
  Filled 2024-03-08 (×3): qty 20

## 2024-03-08 MED ORDER — IOHEXOL 350 MG/ML SOLN
100.0000 mL | Freq: Once | INTRAVENOUS | Status: AC | PRN
  Administered 2024-03-08: 100 mL via INTRAVENOUS

## 2024-03-08 MED ORDER — MIDODRINE HCL 5 MG PO TABS
10.0000 mg | ORAL_TABLET | Freq: Three times a day (TID) | ORAL | Status: DC
  Administered 2024-03-08 – 2024-03-17 (×27): 10 mg via ORAL
  Filled 2024-03-08 (×27): qty 2

## 2024-03-08 NOTE — Progress Notes (Signed)
 PHARMACY - PHYSICIAN COMMUNICATION CRITICAL VALUE ALERT - BLOOD CULTURE IDENTIFICATION (BCID)  [name] Young. is an 83 y.o. male who presented to [facility] on 03/06/2024 with a chief complaint of right perihepatic / abdominal wall abscess s/p drain placement by IR  Assessment: 1/4 bottles GPC. BCID detected E faecalis with [name] A/B resistance detected. Suspect GI source.  Name of physician (or Provider) Contacted: Dr. Caleen  Current antibiotics: Cefepime  & Flagyl   Changes to prescribed antibiotics recommended:  Automatic ID consult, protocol recommendation is to initiate daptomycin. May consider switching cefepime  and Flagyl  to Zosyn  in case penicillin susceptible.  Results for orders placed or performed during the hospital encounter of 03/06/24  Blood Culture ID Panel (Reflexed) (Collected: 03/06/2024 11:56 AM)  Result Value Ref Range   Enterococcus faecalis DETECTED (A) NOT DETECTED   Enterococcus Faecium NOT DETECTED NOT DETECTED   Listeria monocytogenes NOT DETECTED NOT DETECTED   Staphylococcus species NOT DETECTED NOT DETECTED   Staphylococcus aureus (BCID) NOT DETECTED NOT DETECTED   Staphylococcus epidermidis NOT DETECTED NOT DETECTED   Staphylococcus lugdunensis NOT DETECTED NOT DETECTED   Streptococcus species NOT DETECTED NOT DETECTED   Streptococcus agalactiae NOT DETECTED NOT DETECTED   Streptococcus pneumoniae NOT DETECTED NOT DETECTED   Streptococcus pyogenes NOT DETECTED NOT DETECTED   A.calcoaceticus-baumannii NOT DETECTED NOT DETECTED   Bacteroides fragilis NOT DETECTED NOT DETECTED   Enterobacterales NOT DETECTED NOT DETECTED   Enterobacter cloacae complex NOT DETECTED NOT DETECTED   Escherichia coli NOT DETECTED NOT DETECTED   Klebsiella aerogenes NOT DETECTED NOT DETECTED   Klebsiella oxytoca NOT DETECTED NOT DETECTED   Klebsiella pneumoniae NOT DETECTED NOT DETECTED   Proteus species NOT DETECTED NOT DETECTED   Salmonella species NOT DETECTED NOT  DETECTED   Serratia marcescens NOT DETECTED NOT DETECTED   Haemophilus influenzae NOT DETECTED NOT DETECTED   Neisseria meningitidis NOT DETECTED NOT DETECTED   Pseudomonas aeruginosa NOT DETECTED NOT DETECTED   Stenotrophomonas maltophilia NOT DETECTED NOT DETECTED   Candida albicans NOT DETECTED NOT DETECTED   Candida auris NOT DETECTED NOT DETECTED   Candida glabrata NOT DETECTED NOT DETECTED   Candida krusei NOT DETECTED NOT DETECTED   Candida parapsilosis NOT DETECTED NOT DETECTED   Candida tropicalis NOT DETECTED NOT DETECTED   Cryptococcus neoformans/gattii NOT DETECTED NOT DETECTED   Vancomycin  resistance DETECTED (A) NOT DETECTED   Marolyn KATHEE Mare 03/08/2024  8:11 AM

## 2024-03-08 NOTE — Evaluation (Signed)
 Physical Therapy Evaluation Patient Details Name: [name] Young. MRN: [id number] DOB: 1940-12-25 Today's Date: 03/08/2024  History of Present Illness  [name] is a 83 year old male with history of atrial fibrillation on Eliquis , hypertension, CKD 3a, non-insulin -dependent diabetes mellitus, history of endocarditis with Streptococcus bacteremia, history of E. coli bacteremia, who presents ED for chief concerns of perihepatic abscess seen on outpatient imaging.  Patient was having right flank and upper back pain for several weeks now.  Clinical Impression  Pt is a pleasant 83 year old male who was admitted for perihepatic abscess. Pt performs bed mobility with min a with heavy verbal and tactile cues on limb placement and rail use. Pt's BP vitals remaining low, with supine reading of 85/51 and seated 95/40, per pt's wife BP has been chronically low the past couple of months. STS transfers and ambulation deferred this date d/t pt fatigue and vitals. Min a x2 needed for physical  assistance for seated<>supine as pt unable to laterally scoot in bed d/t weakness. Pt demonstrates deficits with balance, strength, and activity tolerance, impacting his QoL. Pt will benefit from skilled PT interventions to meet goals and address deficits noted above. PT to follow acutely as appropriate.          If plan is discharge home, recommend the following: A lot of help with walking and/or transfers;A lot of help with bathing/dressing/bathroom;Assistance with cooking/housework;Assist for transportation;Help with stairs or ramp for entrance;Supervision due to cognitive status   Can travel by private vehicle   No    Equipment Recommendations None recommended by PT  Recommendations for Other Services       Functional Status Assessment Patient has had a recent decline in their functional status and demonstrates the ability to make significant improvements in function in a reasonable and predictable  amount of time.     Precautions / Restrictions Precautions Precautions: Fall Recall of Precautions/Restrictions: Impaired Precaution/Restrictions Comments: pt A&O x2 Restrictions Weight Bearing Restrictions Per Provider Order: No      Mobility  Bed Mobility Overal bed mobility: Needs Assistance Bed Mobility: Rolling, Supine to Sit, Sit to Supine Rolling: Min assist, Used rails   Supine to sit: Min assist, HOB elevated, Used rails Sit to supine: Min assist, +2 for physical assistance, Used rails   General bed mobility comments: min a for all, requiring multiple verbal and tactile cues to initiate and progress movement, pt heavily fatigued with movement and visibly taxed    Transfers                   General transfer comment: STS deferred this date d/t low BP (95/40)    Ambulation/Gait               General Gait Details: deferred this date as not appropriate  Stairs            Wheelchair Mobility     Tilt Bed    Modified Rankin (Stroke Patients Only)       Balance Overall balance assessment: Needs assistance Sitting-balance support: Bilateral upper extremity supported, Feet supported Sitting balance-Leahy Scale: Fair Sitting balance - Comments: sitting EOB, requiring verbal cueing on placement of UEs on bed                                     Pertinent Vitals/Pain Pain Assessment Pain Assessment: Faces Faces Pain Scale: Hurts little more Pain Location:  abdomen right side Pain Descriptors / Indicators: Aching, Constant Pain Intervention(s): Monitored during session, Repositioned    Home Living Family/patient expects to be discharged to:: Private residence Living Arrangements: Spouse/significant other Available Help at Discharge: Family;Available 24 hours/day Type of Home: House Home Access: Stairs to enter Entrance Stairs-Rails: Right Entrance Stairs-Number of Steps: 3   Home Layout: One level Home Equipment:  Agricultural consultant (2 wheels);Cane - single point;Hand held shower head;Tub bench;Rollator (4 wheels) Additional Comments: typically uses RW/Rollator to ambulate, used SPC but within last couple of months hasn't been able to    Prior Function Prior Level of Function : Needs assist       Physical Assist : Mobility (physical);ADLs (physical) Mobility (physical): Gait;Transfers ADLs (physical): Bathing;Dressing;Toileting;IADLs Mobility Comments: has been using RW to ambulate ADLs Comments: wife assists with most ADLs and all IADLs     Extremity/Trunk Assessment   Upper Extremity Assessment Upper Extremity Assessment: Generalized weakness    Lower Extremity Assessment Lower Extremity Assessment: Generalized weakness    Cervical / Trunk Assessment Cervical / Trunk Assessment: Kyphotic  Communication   Communication Communication: No apparent difficulties    Cognition Arousal: Alert Behavior During Therapy: WFL for tasks assessed/performed   PT - Cognitive impairments: Orientation, Awareness, Memory, Attention   Orientation impairments: Place, Time                   PT - Cognition Comments: pt only orientated to person and day of week Following commands: Impaired Following commands impaired: Follows one step commands inconsistently, Follows one step commands with increased time     Cueing Cueing Techniques: Verbal cues, Gestural cues, Tactile cues     General Comments      Exercises     Assessment/Plan    PT Assessment Patient needs continued PT services  PT Problem List Decreased strength;Decreased activity tolerance;Decreased range of motion;Decreased balance;Decreased mobility;Decreased knowledge of use of DME;Decreased safety awareness       PT Treatment Interventions DME instruction;Gait training;Stair training;Functional mobility training;Therapeutic activities;Therapeutic exercise;Balance training;Patient/family education    PT Goals (Current goals can  be found in the Care Plan section)  Acute Rehab PT Goals Patient Stated Goal: to get better and stronger PT Goal Formulation: With patient/family Time For Goal Achievement: 03/22/24 Potential to Achieve Goals: Good    Frequency Min 2X/week     Co-evaluation               AM-PAC PT 6 Clicks Mobility  Outcome Measure Help needed turning from your back to your side while in a flat bed without using bedrails?: A Lot Help needed moving from lying on your back to sitting on the side of a flat bed without using bedrails?: A Lot Help needed moving to and from a bed to a chair (including a wheelchair)?: A Lot Help needed standing up from a chair using your arms (e.g., wheelchair or bedside chair)?: A Lot Help needed to walk in hospital room?: A Lot Help needed climbing 3-5 steps with a railing? : A Lot 6 Click Score: 12    End of Session   Activity Tolerance: Patient limited by fatigue;Treatment limited secondary to medical complications (Comment) Patient left: in bed;with call bell/phone within reach;with family/visitor present;Other (comment) (w/ OT) Nurse Communication: Mobility status;Other (comment) (JP drain status) PT Visit Diagnosis: Muscle weakness (generalized) (M62.81);Repeated falls (R29.6);Other abnormalities of gait and mobility (R26.89);Unsteadiness on feet (R26.81)    Time: 9140-9062 PT Time Calculation (min) (ACUTE ONLY): 38 min   Charges:  Anh Mangano Romero-Perozo, SPT  03/08/2024, 10:35 AM

## 2024-03-08 NOTE — Assessment & Plan Note (Signed)
 Resolved with potassium of 4.8 today - Continue to monitor - Holding spironolactone

## 2024-03-08 NOTE — Assessment & Plan Note (Signed)
 Improved AKI suspect secondary to poor p.o. intake in setting of right upper quadrant abdominal pain. Improving creatinine with IV fluid, currently at 1.17 baseline appears to be around 1.1-1.3. -Avoid hypotension-midodrine dose was increased - Monitor renal function - Keep holding torsemide 

## 2024-03-08 NOTE — Progress Notes (Signed)
 Progress Note   Patient: [name] Young. FMW:[id number] DOB: 1941/07/27 DOA: 03/06/2024     2 DOS: the patient was seen and examined on 03/08/2024   Brief hospital course: Mr. Lora Glomski is a 83 year old male with history of atrial fibrillation on Eliquis , hypertension, CKD 3a, non-insulin -dependent diabetes mellitus, history of endocarditis with Streptococcus bacteremia, history of E. coli bacteremia, who presents ED for chief concerns of perihepatic abscess seen on outpatient imaging.  Patient was having right flank and upper back pain for several weeks now.  Vitals in the ED showed t of 98.5, respiration rate of 23, heart rate 74, blood pressure 101/47, SpO2 100% on room air.  Serum sodium is 136, potassium 5.4, chloride 100, bicarb 24, BUN of 63, serum creatinine 1.82, EGFR 37, nonfasting blood glucose 176, WBC 12.6, hemoglobin 8.3, platelets of 432. Initial lactic acid was 2.6 and on repeat is 2.9.  Blood cultures were drawn.  UA was not consistent with UTI.  ED treatment: Cefepime  2 g IV, Flagyl  500 mg IV one-time doses, sodium chloride  500 mg bolus x 2.  IR was consulted to see the possibility of drain placement.  6/27: Blood pressure is soft at 87/45 -500 cc bolus ordered. Preliminary blood cultures negative in 24-hour.  Leukocytosis resolved, decrease of hemoglobin to 7.1 this morning-all cell lines decreased.  Hyperkalemia with potassium at 5.5.  Received management per hyperkalemia protocol.  6/28: Blood pressure is soft at 85/51.  Hemoglobin at 7.4 after improving to 7.9 s/p 1 unit of PRBC.  Active bleeding from drain, discussed with IR and they were recommending doing a CTA of her abdomen which was ordered-ordered 2 more unit of PRBC Significant hypoalbuminemia with albumin  at 1.6, slowly improving renal function with creatinine at 1.41, potassium at 5-continuing Lokelma now.  Preliminary blood cultures positive for Enterococcus faecalis and abscess cultures growing  gram-positive cocci and gram-negative rods-ID was consulted and daptomycin was added to cefepime  and Flagyl .   Assessment and Plan: * Perihepatic abscess (HCC) S/p drain placement by IR yesterday, mixture of blood and purulent material drained. Started having frank bleeding this morning which has been now slowed down. CTA abdomen was ordered as recommended by IR Preliminary blood cultures with Enterococcus faecalis, abscess cultures pending Daptomycin was ordered as recommended by ID Continue with metronidazole  and cefepime  IV - Follow-up culture results   AKI (acute kidney injury) (HCC) AKI suspect secondary to poor p.o. intake in setting of right upper quadrant abdominal pain. Improving creatinine with IV fluid, currently at 1.41, baseline appears to be around 1.1-1.3. Patient will need a contrast for CTA for concern of bleeding from the drain -Avoid hypotension-midodrine dose was increased - Continue with gentle IV fluid - Monitor renal function - Keep holding torsemide    Hyperkalemia Potassium initially at 5.0 this morning s/p insulin  with dextrose  and albuterol   -Continue with Lokelma - Continue to monitor - Holding spironolactone    PAF (paroxysmal atrial fibrillation) (HCC) Intermittent heart rate in 40s - Holding metoprolol  due to softer blood pressure  Anemia Hemoglobin 7.1>>7.9>>7.4 this morning, frank bleeding from drain today.  S/p 1 unit of PRBC CT of abdomen was ordered as recommended by IR Anemia panel consistent with anemia of chronic disease, pending B12 -Ordered 2 more units unit of PRBC -Monitor hemoglobin -Transfuse below 7  S/P CABG x 3 Home rosuvastatin  10 milligrams nightly resumed, metoprolol  tartrate 12.5 mg p.o. twice daily will resume Home aspirin  not resumed on admission in setting of anticipating IR procedure  Essential hypertension Blood pressure currently soft Holding metoprolol  12.5 mg p.o. twice daily  Keep holding home lisinopril   and spironolactone -Creasing the dose of midodrine to 10 mg 3 times daily to avoid hypotension  Hyperlipidemia associated with type 2 diabetes mellitus (HCC) Rosuvastatin  10 mg daily  Protein-calorie malnutrition, severe [facility]) Registered dietitian has been consulted  General weakness PT/OT evaluation  Insomnia Melatonin nightly as needed for sleep      Subjective: Patient continued to feel weak.  No significant pain today.  Wife at bedside  Physical Exam: Vitals:   03/08/24 1222 03/08/24 1251 03/08/24 1512 03/08/24 1600  BP: (!) 97/53 (!) 99/59 (!) 107/59 (!) 105/52  Pulse: 65 66 64 65  Resp:  18 18 17   Temp: 98 F (36.7 C) 98.2 F (36.8 C) 98.6 F (37 C) 98 F (36.7 C)  TempSrc: Oral Oral Oral Oral  SpO2: 99% 100% 97% 98%  Weight:      Height:       General.  Chronically ill-appearing elderly man, in no acute distress. Pulmonary.  Lungs clear bilaterally, normal respiratory effort. CV.  Regular rate and rhythm, no JVD, rub or murmur. Abdomen.  Soft, nontender, nondistended, BS positive. CNS.  Little lethargic, no neurodeficit Extremities.  No edema,  pulses intact and symmetrical.   Data Reviewed: Prior data reviewed  Family Communication: Discussed with wife at bedside  Disposition: Status is: Inpatient Remains inpatient appropriate because: Severity of illness  Planned Discharge Destination: To be determined  Time spent: 50 minutes  This record has been created using Conservation officer, historic buildings. Errors have been sought and corrected,but may not always be located. Such creation errors do not reflect on the standard of care.   Author: Amaryllis Dare, MD 03/08/2024 4:33 PM  For on call review [email or web address].

## 2024-03-08 NOTE — Progress Notes (Signed)
 200 ml output of frank blood from JP drain in the past 30 minutes.  Dr. Caleen notified

## 2024-03-08 NOTE — Assessment & Plan Note (Signed)
 S/p drain placement by IR yesterday, mixture of blood and purulent material drained. Started having frank bleeding this morning which has been now slowed down. CTA abdomen was ordered as recommended by IR Preliminary blood cultures with Enterococcus faecalis, abscess cultures pending Daptomycin was ordered as recommended by ID Continue with metronidazole  and cefepime  IV - Follow-up culture results

## 2024-03-08 NOTE — Assessment & Plan Note (Signed)
 Hemoglobin 7.1>>7.9>>7.4>>8.8 this morning,  S/p 2 unit of PRBC Anemia panel consistent with anemia of chronic disease,  -Monitor hemoglobin -Transfuse below 7

## 2024-03-08 NOTE — Evaluation (Signed)
 Occupational Therapy Evaluation Patient Details Name: [name] Young. MRN: [id number] DOB: 12-03-1940 Today's Date: 03/08/2024   History of Present Illness   [name] is a 83 year old male with history of atrial fibrillation on Eliquis , hypertension, CKD 3a, non-insulin -dependent diabetes mellitus, history of endocarditis with Streptococcus bacteremia, history of E. coli bacteremia, who presents ED for chief concerns of perihepatic abscess seen on outpatient imaging.  Patient was having right flank and upper back pain for several weeks now.     Clinical Impressions Patient presenting with decreased Ind in self care,balance, functional mobility/transfers, endurance, and safety awareness. Patient lives at home with wife. He has been ambulating short distances from lift chair, where he sleeps, to bathroom for BM needs. Wife assists him with ADLs and IADLs. During session, OT notified RN of JP drain being full and pt complaints of pain in R chest. RN asked pt to remain at bed level. Pt performs grooming tasks with set up A and rolls to be repositioned with max A. Pt will need further OOB assessment when he is able to safely participate further.  Patient will benefit from acute OT to increase overall independence in the areas of ADLs, functional mobility, and safety awareness in order to safely discharge.     If plan is discharge home, recommend the following:   A lot of help with walking and/or transfers;A lot of help with bathing/dressing/bathroom;Help with stairs or ramp for entrance;Assist for transportation;Direct supervision/assist for financial management;Direct supervision/assist for medications management;Supervision due to cognitive status;Assistance with cooking/housework     Functional Status Assessment   Patient has had a recent decline in their functional status and demonstrates the ability to make significant improvements in function in a reasonable and predictable  amount of time.     Equipment Recommendations   Other (comment) (defer to next venue of care)      Precautions/Restrictions   Precautions Precautions: Fall Recall of Precautions/Restrictions: Impaired Precaution/Restrictions Comments: JP drain Restrictions Weight Bearing Restrictions Per Provider Order: No     Mobility Bed Mobility Overal bed mobility: Needs Assistance Bed Mobility: Rolling Rolling: Max assist              Transfers                   General transfer comment: deferred per RN request          ADL either performed or assessed with clinical judgement   ADL Overall ADL's : Needs assistance/impaired     Grooming: Wash/dry hands;Wash/dry face;Bed level;Set up;Supervision/safety                                 General ADL Comments: anticipate max- total A for LB self care and min A for UB self care     Vision Patient Visual Report: No change from baseline              Pertinent Vitals/Pain Pain Assessment Pain Assessment: Faces Faces Pain Scale: Hurts little more Pain Location: abdomen right side Pain Descriptors / Indicators: Aching, Constant Pain Intervention(s): Monitored during session, Repositioned, Other (comment) (RN notified)     Extremity/Trunk Assessment Upper Extremity Assessment Upper Extremity Assessment: Generalized weakness   Lower Extremity Assessment Lower Extremity Assessment: Generalized weakness       Communication Communication Communication: No apparent difficulties   Cognition Arousal: Alert Behavior During Therapy: WFL for tasks assessed/performed Cognition: History of cognitive impairments  OT - Cognition Comments: oriented to self and location                 Following commands: Impaired Following commands impaired: Follows one step commands inconsistently, Follows one step commands with increased time     Cueing  General Comments   Cueing  Techniques: Verbal cues;Gestural cues;Tactile cues              Home Living Family/patient expects to be discharged to:: Private residence Living Arrangements: Spouse/significant other Available Help at Discharge: Family;Available 24 hours/day Type of Home: House Home Access: Stairs to enter [employer] of Steps: 3 Entrance Stairs-Rails: Right Home Layout: One level     Bathroom Shower/Tub: Chief Strategy Officer: Handicapped height Bathroom Accessibility: No   Home Equipment: Agricultural consultant (2 wheels);Cane - single point;Hand held shower head;Tub bench;Rollator (4 wheels)   Additional Comments: typically uses RW/Rollator to ambulate, used SPC but within last couple of months hasn't been able to      Prior Functioning/Environment Prior Level of Function : Needs assist       Physical Assist : Mobility (physical);ADLs (physical) Mobility (physical): Gait;Transfers ADLs (physical): Bathing;Dressing;Toileting;IADLs Mobility Comments: has been using RW to ambulate ADLs Comments: wife assists with most ADLs and all IADLs    OT Problem List: Decreased strength;Impaired balance (sitting and/or standing);Pain;Decreased safety awareness;Decreased activity tolerance;Decreased knowledge of use of DME or AE   OT Treatment/Interventions: Self-care/ADL training;Therapeutic exercise;Patient/family education;Neuromuscular education;Energy conservation;Therapeutic activities;DME and/or AE instruction      OT Goals(Current goals can be found in the care plan section)   Acute Rehab OT Goals Patient Stated Goal: to get better OT Goal Formulation: With patient/family Time For Goal Achievement: 03/22/24 Potential to Achieve Goals: Fair ADL Goals Pt Will Perform Grooming: with supervision;sitting Pt Will Perform Lower Body Dressing: with min assist;sit to/from stand Pt Will Transfer to Toilet: with min assist;bedside commode;stand pivot transfer Pt Will Perform  Toileting - Clothing Manipulation and hygiene: with min assist;sit to/from stand   OT Frequency:  Min 2X/week       AM-PAC OT 6 Clicks Daily Activity     Outcome Measure Help from another person eating meals?: None Help from another person taking care of personal grooming?: A Little Help from another person toileting, which includes using toliet, bedpan, or urinal?: A Lot Help from another person bathing (including washing, rinsing, drying)?: A Lot Help from another person to put on and taking off regular upper body clothing?: A Little Help from another person to put on and taking off regular lower body clothing?: A Lot 6 Click Score: 16   End of Session Nurse Communication: Other (comment) (JP drain full and increased pain)  Activity Tolerance: Patient limited by fatigue;Patient limited by pain Patient left: in bed;with call bell/phone within reach;with nursing/sitter in room;with family/visitor present;with bed alarm set  OT Visit Diagnosis: Unsteadiness on feet (R26.81);Repeated falls (R29.6);Muscle weakness (generalized) (M62.81)                Time: 9069-9054 OT Time Calculation (min): 15 min Charges:  OT General Charges $OT Visit: 1 Visit OT Evaluation $OT Eval Low Complexity: 1 Low Izetta Claude, MS, OTR/L , CBIS ascom [phone]  03/08/24, 10:00 AM

## 2024-03-08 NOTE — Consult Note (Signed)
 Pharmacy Antibiotic Note  [name] Young. is a 83 y.o. male with history of atrial fibrillation on Eliquis , hypertension, CKD 3a, non-insulin -dependent diabetes mellitus, history of endocarditis with Streptococcus bacteremia, history of E. coli bacteremia  admitted on 03/06/2024 with right perihepatic / abdominal wall abscess s/p drain placement by IR.  Pharmacy has been consulted for daptomycin dosing.  ID consulted  Plan:  Daptomycin 1000 mg q24h (10 mg/kg)  Height: 5' 10 (177.8 cm) Weight: 98.1 kg (216 lb 4.3 oz) IBW/kg (Calculated) : 73  Temp (24hrs), Avg:98.3 F (36.8 C), Min:97.6 F (36.4 C), Max:99 F (37.2 C)  Recent Labs  Lab 03/06/24 1122 03/06/24 1156 03/06/24 1425 03/06/24 1739 03/07/24 0508 03/07/24 0921 03/08/24 0455  WBC 12.6*  --   --   --  10.4  --  7.3  CREATININE 1.82*  --   --   --  1.62* 1.54* 1.41*  LATICACIDVEN  --  2.6* 2.9* 1.5  --   --   --     Estimated Creatinine Clearance: 48.2 mL/min (A) (by C-G formula based on SCr of 1.41 mg/dL (H)).    No Known Allergies  Antimicrobials this admission: Cefepime  6/26 >>  Metronidazole  6/26 >>  Daptomycin 6/28 >>   Dose adjustments this admission: N/A  Microbiology results: 6/26 BCx: 1/4 bottles GPC. BCID detected E faecalis with [name] A/B resistance 6/27 Abscess: GPC, GNR, pending  Thank you for allowing pharmacy to be a part of this patient's care.  [name] Young 03/08/2024 8:28 AM

## 2024-03-08 NOTE — Assessment & Plan Note (Signed)
 Blood pressure within lower goal today. Holding metoprolol  12.5 mg p.o. twice daily  Keep holding home lisinopril  and spironolactone - Continue with midodrine 10 mg 3 times daily -Avoid hypotension

## 2024-03-08 NOTE — Progress Notes (Signed)
 Attempted IV, unsuccessfully.  Shayanne Gomm V Coreena Rubalcava

## 2024-03-08 NOTE — Assessment & Plan Note (Signed)
 Intermittent heart rate in 40s - Holding metoprolol  due to softer blood pressure

## 2024-03-09 ENCOUNTER — Encounter: Payer: Self-pay | Admitting: Internal Medicine

## 2024-03-09 DIAGNOSIS — E875 Hyperkalemia: Secondary | ICD-10-CM | POA: Diagnosis not present

## 2024-03-09 DIAGNOSIS — D649 Anemia, unspecified: Secondary | ICD-10-CM | POA: Diagnosis not present

## 2024-03-09 DIAGNOSIS — N179 Acute kidney failure, unspecified: Secondary | ICD-10-CM | POA: Diagnosis not present

## 2024-03-09 DIAGNOSIS — K650 Generalized (acute) peritonitis: Secondary | ICD-10-CM | POA: Diagnosis not present

## 2024-03-09 LAB — BASIC METABOLIC PANEL WITH GFR
Anion gap: 5 (ref 5–15)
BUN: 40 mg/dL — ABNORMAL HIGH (ref 8–23)
CO2: 24 mmol/L (ref 22–32)
Calcium: 8.1 mg/dL — ABNORMAL LOW (ref 8.9–10.3)
Chloride: 111 mmol/L (ref 98–111)
Creatinine, Ser: 1.17 mg/dL (ref 0.61–1.24)
GFR, Estimated: >60 mL/min (ref >60)
Glucose, Bld: 106 mg/dL — ABNORMAL HIGH (ref 70–99)
Potassium: 5.1 mmol/L (ref 3.5–5.1)
Sodium: 140 mmol/L (ref 135–145)

## 2024-03-09 LAB — TYPE AND SCREEN
ABO/RH(D): A POS
Antibody Screen: NEGATIVE
Unit division: 0
Unit division: 0

## 2024-03-09 LAB — CBC
HCT: 29.5 % — ABNORMAL LOW (ref 39.0–52.0)
Hemoglobin: 8.8 g/dL — ABNORMAL LOW (ref 13.0–17.0)
MCH: 24.4 pg — ABNORMAL LOW (ref 26.0–34.0)
MCHC: 29.8 g/dL — ABNORMAL LOW (ref 30.0–36.0)
MCV: 81.9 fL (ref 80.0–100.0)
Platelets: 329 10*3/uL (ref 150–400)
RBC: 3.6 MIL/uL — ABNORMAL LOW (ref 4.22–5.81)
RDW: 17.2 % — ABNORMAL HIGH (ref 11.5–15.5)
WBC: 8.1 10*3/uL (ref 4.0–10.5)
nRBC: 0 % (ref 0.0–0.2)

## 2024-03-09 LAB — BPAM RBC
Blood Product Expiration Date: 202507272359
Blood Product Expiration Date: 202508022359
ISSUE DATE / TIME: 202506271148
ISSUE DATE / TIME: 202506281232
Unit Type and Rh: 6200
Unit Type and Rh: 6200

## 2024-03-09 MED ORDER — FE FUM-VIT C-VIT B12-FA 460-60-0.01-1 MG PO CAPS
1.0000 | ORAL_CAPSULE | Freq: Every day | ORAL | Status: DC
  Administered 2024-03-09 – 2024-03-17 (×9): 1 via ORAL
  Filled 2024-03-09 (×9): qty 1

## 2024-03-09 MED ORDER — TAMSULOSIN HCL 0.4 MG PO CAPS
0.4000 mg | ORAL_CAPSULE | Freq: Every day | ORAL | Status: DC
  Administered 2024-03-09 – 2024-03-16 (×8): 0.4 mg via ORAL
  Filled 2024-03-09 (×8): qty 1

## 2024-03-09 MED ORDER — ENOXAPARIN SODIUM 40 MG/0.4ML IJ SOSY
40.0000 mg | PREFILLED_SYRINGE | Freq: Every day | INTRAMUSCULAR | Status: DC
  Administered 2024-03-09 – 2024-03-10 (×2): 40 mg via SUBCUTANEOUS
  Filled 2024-03-09 (×2): qty 0.4

## 2024-03-09 MED ORDER — SODIUM ZIRCONIUM CYCLOSILICATE 10 G PO PACK
10.0000 g | PACK | Freq: Every day | ORAL | Status: DC
  Administered 2024-03-09 – 2024-03-11 (×3): 10 g via ORAL
  Filled 2024-03-09 (×3): qty 1

## 2024-03-09 NOTE — Progress Notes (Signed)
 Progress Note   Patient: [name] Young. FMW:[id number] DOB: [birth year] DOA: 03/06/2024     3 DOS: the patient was seen and examined on 03/09/2024   Brief hospital course: [name] is a 83 year old male with history of atrial fibrillation on Eliquis , hypertension, CKD 3a, non-insulin -dependent diabetes mellitus, history of endocarditis with Streptococcus bacteremia, history of E. coli bacteremia, who presents ED for chief concerns of perihepatic abscess seen on outpatient imaging.  Patient was having right flank and upper back pain for several weeks now.  Vitals in the ED showed t of 98.5, respiration rate of 23, heart rate 74, blood pressure 101/47, SpO2 100% on room air.  Serum sodium is 136, potassium 5.4, chloride 100, bicarb 24, BUN of 63, serum creatinine 1.82, EGFR 37, nonfasting blood glucose 176, WBC 12.6, hemoglobin 8.3, platelets of 432. Initial lactic acid was 2.6 and on repeat is 2.9.  Blood cultures were drawn.  UA was not consistent with UTI.  ED treatment: Cefepime  2 g IV, Flagyl  500 mg IV one-time doses, sodium chloride  500 mg bolus x 2.  IR was consulted to see the possibility of drain placement.  6/27: Blood pressure is soft at 87/45 -500 cc bolus ordered. Preliminary blood cultures negative in 24-hour.  Leukocytosis resolved, decrease of hemoglobin to 7.1 this morning-all cell lines decreased.  Hyperkalemia with potassium at 5.5.  Received management per hyperkalemia protocol.  6/28: Blood pressure is soft at 85/51.  Hemoglobin at 7.4 after improving to 7.9 s/p 1 unit of PRBC.  Active bleeding from drain, discussed with IR and they were recommending doing a CTA of her abdomen which was ordered-ordered 2 more unit of PRBC Significant hypoalbuminemia with albumin  at 1.6, slowly improving renal function with creatinine at 1.41, potassium at 5-continuing Lokelma now.  Preliminary blood cultures positive for Enterococcus faecalis and abscess cultures growing  gram-positive cocci and gram-negative rods-ID was consulted and daptomycin was added to cefepime  and Flagyl .  6/29: Hemodynamically stable, hemoglobin stable now at 8.8, leukocytosis has been resolved.  Renal function improved 1.17.  CTA abdomen yesterday with no active hemorrhage.  Did show extensive collection of fluid at right lung base extending up to Paradox base and right abdominal wall-some improvement since this admission after the drain.  Still awaiting cultures from drain.   Assessment and Plan: * Perihepatic abscess (HCC) Enterococcus faecalis bacteremia S/p drain placement by IR, mixture of blood and purulent material drained. Had frank bleeding on 6/28 morning which has been improved now.  Hemoglobin now at 8.8 s/p 2 unit of PRBC CTA abdomen with no active hemorrhage and significant amount of fluid collection with some improvement as compared to prior CT. Preliminary blood cultures with Enterococcus faecalis, abscess cultures pending Daptomycin was ordered as recommended by ID Continue with metronidazole  and cefepime  IV - Follow-up culture results   AKI (acute kidney injury) (HCC) Improved AKI suspect secondary to poor p.o. intake in setting of right upper quadrant abdominal pain. Improving creatinine with IV fluid, currently at 1.17 baseline appears to be around 1.1-1.3. -Avoid hypotension-midodrine dose was increased - Continue with gentle IV fluid - Monitor renal function - Keep holding torsemide    Hyperkalemia Potassium initially at 5.1 this morning s/p insulin  with dextrose  and albuterol   -Continue with Lokelma - Continue to monitor - Holding spironolactone    PAF (paroxysmal atrial fibrillation) (HCC) Heart rate well-controlled - Holding metoprolol  due to softer blood pressure  Anemia Hemoglobin 7.1>>7.9>>7.4>>8.8 this morning,  S/p 2 unit of PRBC Anemia panel  consistent with anemia of chronic disease,  -Monitor hemoglobin -Transfuse below 7  S/P CABG x  3 Home rosuvastatin  10 milligrams nightly resumed, metoprolol  tartrate 12.5 mg p.o. twice daily will resume Home aspirin  not resumed on admission in setting of anticipating IR procedure   Essential hypertension Blood pressure currently soft Holding metoprolol  12.5 mg p.o. twice daily  Keep holding home lisinopril  and spironolactone - Continue with midodrine 10 mg 3 times daily -Avoid hypotension    Hyperlipidemia associated with type 2 diabetes mellitus (HCC) Rosuvastatin  10 mg daily  Protein-calorie malnutrition, severe (HCC) Registered dietitian has been consulted  General weakness PT/OT evaluation  Insomnia Melatonin nightly as needed for sleep      Subjective: Patient was seen and examined today.  Denies any pain.  When asked how is he feeling, stating I am not getting worse.  Physical Exam: Vitals:   03/08/24 1600 03/08/24 1936 03/09/24 0201 03/09/24 0727  BP: (!) 105/52 (!) 100/53 (!) 100/47 (!) 104/53  Pulse: 65 77 75 65  Resp: 17 17 19 18   Temp: 98 F (36.7 C) 98.6 F (37 C) 98.2 F (36.8 C) 98.3 F (36.8 C)  TempSrc: Oral Oral Oral Oral  SpO2: 98% 99% 99% 98%  Weight:      Height:       General.  Frail elderly man, in no acute distress. Pulmonary.  Lungs clear bilaterally, normal respiratory effort. CV.  Regular rate and rhythm, no JVD, rub or murmur. Abdomen.  Soft, nontender, nondistended, BS positive.  RUQ drain in place with bloody serous discharge. CNS.  Alert and oriented .  No focal neurologic deficit. Extremities.  No edema, no cyanosis, pulses intact and symmetrical. Psychiatry.  Judgment and insight appears normal.    Data Reviewed: Prior data reviewed  Family Communication: Discussed with wife at bedside  Disposition: Status is: Inpatient Remains inpatient appropriate because: Severity of illness  Planned Discharge Destination: To be determined  Time spent: 50 minutes  This record has been created using Manufacturing engineer. Errors have been sought and corrected,but may not always be located. Such creation errors do not reflect on the standard of care.   Author: Amaryllis Dare, MD 03/09/2024 12:43 PM  For on call review [email or web address].

## 2024-03-10 ENCOUNTER — Other Ambulatory Visit (HOSPITAL_COMMUNITY): Payer: Self-pay | Admitting: Radiology

## 2024-03-10 DIAGNOSIS — K651 Peritoneal abscess: Secondary | ICD-10-CM

## 2024-03-10 DIAGNOSIS — D649 Anemia, unspecified: Secondary | ICD-10-CM | POA: Diagnosis not present

## 2024-03-10 DIAGNOSIS — K650 Generalized (acute) peritonitis: Secondary | ICD-10-CM | POA: Diagnosis not present

## 2024-03-10 DIAGNOSIS — E875 Hyperkalemia: Secondary | ICD-10-CM | POA: Diagnosis not present

## 2024-03-10 DIAGNOSIS — N179 Acute kidney failure, unspecified: Secondary | ICD-10-CM | POA: Diagnosis not present

## 2024-03-10 LAB — CULTURE, BLOOD (ROUTINE X 2)
Culture  Setup Time: NO GROWTH
Special Requests: ADEQUATE

## 2024-03-10 MED ORDER — PIPERACILLIN-TAZOBACTAM 3.375 G IVPB
3.3750 g | Freq: Three times a day (TID) | INTRAVENOUS | Status: AC
  Administered 2024-03-10 – 2024-03-11 (×3): 3.375 g via INTRAVENOUS
  Filled 2024-03-10 (×3): qty 50

## 2024-03-10 MED ORDER — POLYETHYLENE GLYCOL 3350 17 G PO PACK: 17.0000 g | PACK | Freq: Every day | ORAL | Status: DC | PRN

## 2024-03-10 NOTE — TOC Progression Note (Signed)
 Transition of Care (TOC) - Progression Note    Patient Details  Name: [name] Young. MRN: [id number] Date of Birth: Dec 23, 1940  Transition of Care [facility]) CM/SW Contact  Marinda Cooks, RN Phone Number: 03/10/2024, 5:08 PM  Clinical Narrative:    This CM spoke with pt's wife introduced role and discussed dc planning per medical team & PT recommendations. Pt's wife informed pt rec'd therapy at [facility] in the past and requested that referrals be sent out based on their zip code. This CM completed FL2 and sent referrals awaiting bed offers for choice selection. TOC will cont to follow dc planning /care coordination and update as applicable.    Expected Discharge Plan: Home/Self Care Barriers to Discharge: Continued Medical Work up  Expected Discharge Plan and Services     Post Acute Care Choice: Durable Medical Equipment Living arrangements for the past 2 months: Single Family Home                                       Social Determinants of Health (SDOH) Interventions SDOH Screenings   Food Insecurity: Unknown (03/06/2024)  Housing: Low Risk  (03/06/2024)  Transportation Needs: No Transportation Needs (03/06/2024)  Utilities: Not At Risk (03/06/2024)  Depression (PHQ2-9): Low Risk  (12/31/2023)  Financial Resource Strain: Low Risk  (12/15/2023)   Received from [facility]  Physical Activity: Unknown (10/08/2023)  Social Connections: Moderately Isolated (03/06/2024)  Stress: Patient Declined (10/08/2023)  Tobacco Use: Medium Risk (03/09/2024)    Readmission Risk Interventions    03/07/2024    3:03 PM  Readmission Risk Prevention Plan  Transportation Screening Complete  PCP or Specialist Appt within 3-5 Days Complete  Social Work Consult for Recovery Care Planning/Counseling Complete  Palliative Care Screening Not Applicable  Medication Review Oceanographer) Complete

## 2024-03-10 NOTE — Plan of Care (Signed)
  Problem: Education: Goal: Knowledge of General Education information will improve Description: Including pain rating scale, medication(s)/side effects and non-pharmacologic comfort measures Outcome: Progressing   Problem: Health Behavior/Discharge Planning: Goal: Ability to manage health-related needs will improve Outcome: Progressing   Problem: Clinical Measurements: Goal: Will remain free from infection Outcome: Progressing   Problem: Elimination: Goal: Will not experience complications related to bowel motility Outcome: Progressing   Problem: Nutrition: Goal: Adequate nutrition will be maintained Outcome: Progressing   Problem: Skin Integrity: Goal: Risk for impaired skin integrity will decrease Outcome: Progressing

## 2024-03-10 NOTE — Progress Notes (Signed)
 Progress Note   Patient: [name] Young. FMW:[id number] DOB: [birth year] DOA: 03/06/2024     4 DOS: the patient was seen and examined on 03/10/2024   Brief hospital course: [name] is a 83 year old male with history of atrial fibrillation on Eliquis , hypertension, CKD 3a, non-insulin -dependent diabetes mellitus, history of endocarditis with Streptococcus bacteremia, history of E. coli bacteremia, who presents ED for chief concerns of perihepatic abscess seen on outpatient imaging.  Patient was having right flank and upper back pain for several weeks now.  Vitals in the ED showed t of 98.5, respiration rate of 23, heart rate 74, blood pressure 101/47, SpO2 100% on room air.  Serum sodium is 136, potassium 5.4, chloride 100, bicarb 24, BUN of 63, serum creatinine 1.82, EGFR 37, nonfasting blood glucose 176, WBC 12.6, hemoglobin 8.3, platelets of 432. Initial lactic acid was 2.6 and on repeat is 2.9.  Blood cultures were drawn.  UA was not consistent with UTI.  ED treatment: Cefepime  2 g IV, Flagyl  500 mg IV one-time doses, sodium chloride  500 mg bolus x 2.  IR was consulted to see the possibility of drain placement.  6/27: Blood pressure is soft at 87/45 -500 cc bolus ordered. Preliminary blood cultures negative in 24-hour.  Leukocytosis resolved, decrease of hemoglobin to 7.1 this morning-all cell lines decreased.  Hyperkalemia with potassium at 5.5.  Received management per hyperkalemia protocol.  6/28: Blood pressure is soft at 85/51.  Hemoglobin at 7.4 after improving to 7.9 s/p 1 unit of PRBC.  Active bleeding from drain, discussed with IR and they were recommending doing a CTA of her abdomen which was ordered-ordered 2 more unit of PRBC Significant hypoalbuminemia with albumin  at 1.6, slowly improving renal function with creatinine at 1.41, potassium at 5-continuing Lokelma now.  Preliminary blood cultures positive for Enterococcus faecalis and abscess cultures growing  gram-positive cocci and gram-negative rods-ID was consulted and daptomycin was added to cefepime  and Flagyl .  6/29: Hemodynamically stable, hemoglobin stable now at 8.8, leukocytosis has been resolved.  Renal function improved 1.17.  CTA abdomen yesterday with no active hemorrhage.  Did show extensive collection of fluid at right lung base extending up to Paradox base and right abdominal wall-some improvement since this admission after the drain.  Still awaiting cultures from drain.  6/30: Hemodynamically stable, continue to have bloody serous discharge in drain.  No pain today. Repeat blood cultures were ordered.   Assessment and Plan: * Perihepatic abscess (HCC) Enterococcus faecalis bacteremia S/p drain placement by IR, mixture of blood and purulent material drained. Had frank bleeding on 6/28 morning which has been improved now.  Hemoglobin now at 8.8 s/p 2 unit of PRBC CTA abdomen with no active hemorrhage and significant amount of fluid collection with some improvement as compared to prior CT. Blood and abscess culture with Enterococcus faecalis resistant to vancomycin  Daptomycin was ordered as recommended by ID Continue with metronidazole  and cefepime  IV - Follow-up culture results   AKI (acute kidney injury) (HCC) Improved AKI suspect secondary to poor p.o. intake in setting of right upper quadrant abdominal pain. Improving creatinine with IV fluid, currently at 1.17 baseline appears to be around 1.1-1.3. -Avoid hypotension-midodrine dose was increased - Monitor renal function - Keep holding torsemide    Hyperkalemia Potassium initially at 5.1 this morning s/p insulin  with dextrose  and albuterol   -Continue with Lokelma - Continue to monitor - Holding spironolactone    PAF (paroxysmal atrial fibrillation) (HCC) Heart rate well-controlled - Holding metoprolol  due to softer  blood pressure  Anemia Hemoglobin 7.1>>7.9>>7.4>>8.8 this morning,  S/p 2 unit of PRBC Anemia  panel consistent with anemia of chronic disease,  -Monitor hemoglobin -Transfuse below 7  S/P CABG x 3 Home rosuvastatin  10 milligrams nightly resumed, metoprolol  tartrate 12.5 mg p.o. twice daily will resume Home aspirin  not resumed on admission in setting of anticipating IR procedure   Essential hypertension Blood pressure currently soft Holding metoprolol  12.5 mg p.o. twice daily  Keep holding home lisinopril  and spironolactone - Continue with midodrine 10 mg 3 times daily -Avoid hypotension    Hyperlipidemia associated with type 2 diabetes mellitus (HCC) Rosuvastatin  10 mg daily  Protein-calorie malnutrition, severe (HCC) Registered dietitian has been consulted  General weakness PT/OT evaluation  Insomnia Melatonin nightly as needed for sleep      Subjective: Patient was lying comfortably when seen today.  Denies any pain.  Multiple family members at bedside.  Physical Exam: Vitals:   03/09/24 2021 03/10/24 0434 03/10/24 0726 03/10/24 1609  BP: (!) 112/47 (!) 106/54 (!) 92/37 (!) 102/56  Pulse:  68 60 68  Resp: 20 20 16 18   Temp: 98.4 F (36.9 C) 98.3 F (36.8 C) 98 F (36.7 C) 98.2 F (36.8 C)  TempSrc: Oral Oral Oral Oral  SpO2: 100% 99% 99% 100%  Weight:      Height:       General.  Frail elderly man, in no acute distress. Pulmonary.  Lungs clear bilaterally, normal respiratory effort. CV.  Regular rate and rhythm, no JVD, rub or murmur. Abdomen.  Soft, nontender, nondistended, BS positive.  RUQ drain in place CNS.  Alert and oriented .  No focal neurologic deficit. Extremities.  No edema, pulses intact and symmetrical. Psychiatry.  Judgment and insight appears normal.     Data Reviewed: Prior data reviewed  Family Communication: Discussed with wife and daughter at bedside  Disposition: Status is: Inpatient Remains inpatient appropriate because: Severity of illness  Planned Discharge Destination: To be determined  Time spent: 50  minutes  This record has been created using Conservation officer, historic buildings. Errors have been sought and corrected,but may not always be located. Such creation errors do not reflect on the standard of care.   Author: Amaryllis Dare, MD 03/10/2024 4:29 PM  For on call review [email or web address].

## 2024-03-10 NOTE — Progress Notes (Signed)
 Occupational Therapy Treatment Patient Details Name: [name] Young. MRN: [id number] DOB: 26-Dec-1940 Today's Date: 03/10/2024   History of present illness Pt is a 83 y.o. male admitted with perihepatic abscess, s/p CT/US  guided drainage with RUQ drain placement on 6/27, AKI, & protein-calorie malnutrition. PMH significant for CABGx3, CHF, DOE, AFIB on Eliquis , HTN, CKD 3a, DM, endocarditis with Streptococcus bacteremia, & E. coli bacteremia   OT comments   Pt seen today for OT tx, pt pleasant, joking and generally oriented to self / situation. Vitals assessed prior to mobility - BP long sitting in bed 108/43 (61), HR 30s bpm. Pt denies pain, SOB, lightheadedness or dizziness, RN immediately called into room to assess and alerted MD. With RN present, pt transitioned to sit EOB requiring MIN A +2 with assist at trunk and BLEs, sat EOB with close supervision, HR briefly increasing to 66 bpm with mobility efforts, decreasing down to 40s bpm after sitting ~2 mins. Pt returned to supine, BP 114/41 (62), HR 39. SpO2 98-100% on RA throughout. Unable to further assess functional transfers and mobility, OT will continue to follow. Anticipate MAX A for LB ADLs and MIN A for UB ADLs. Pt left with NT in room for EKG.       If plan is discharge home, recommend the following:  A lot of help with walking and/or transfers;A lot of help with bathing/dressing/bathroom;Help with stairs or ramp for entrance;Assist for transportation;Direct supervision/assist for financial management;Direct supervision/assist for medications management;Supervision due to cognitive status;Assistance with cooking/housework   Equipment Recommendations  Other (comment)       Precautions / Restrictions Precautions Precautions: Fall Recall of Precautions/Restrictions: Impaired Precaution/Restrictions Comments: JP drain RUQ, monitor BP and HR Restrictions Weight Bearing Restrictions Per Provider Order: No       Mobility Bed  Mobility Overal bed mobility: Needs Assistance       Supine to sit: Min assist, HOB elevated, Used rails, +2 for physical assistance     General bed mobility comments: requires increased time, pt generally weak with movement, assist for trunk and LEs    Transfers                   General transfer comment: NT due to low HR sitting EOB, pt returned to supine with RN assist     Balance Overall balance assessment: Needs assistance Sitting-balance support: Bilateral upper extremity supported, Feet supported Sitting balance-Leahy Scale: Fair Sitting balance - Comments: sat briefly EOB, HR increased to 66 after transitioning from supine > sitting. Pt tolerates ~2 mins seated EOB with BUE support and supervision for vital assessment                                   ADL either performed or assessed with clinical judgement   ADL Overall ADL's : Needs assistance/impaired                                       General ADL Comments: anticipate max- total A for LB self care and min A for UB self care. further OOB assessment deferred due to low HR.     Communication Communication Communication: No apparent difficulties   Cognition Arousal: Alert Behavior During Therapy: WFL for tasks assessed/performed Cognition: History of cognitive impairments  OT - Cognition Comments: oriented to self, location, day of the week (when provided choice of two months, pt choose June and states it is almost July).                 Following commands: Impaired Following commands impaired: Follows one step commands inconsistently, Follows one step commands with increased time      Cueing   Cueing Techniques: Verbal cues, Gestural cues, Tactile cues  Exercises      Shoulder Instructions       General Comments BP sitting in bed 108/43 (61), HR 30s bpm. RN called into room. With RN present, pt transitioned to sit EOB with HR briefly  increasing to 66 bpm, decreasing down to 40s bpm. Pt returned to supine, BP 114/41 (62), HR 39. SpO2 98-100% on RA throughout.    Pertinent Vitals/ Pain       Pain Assessment Pain Assessment: No/denies pain   Frequency  Min 2X/week        Progress Toward Goals  OT Goals(current goals can now be found in the care plan section)  Progress towards OT goals: Progressing toward goals  Acute Rehab OT Goals OT Goal Formulation: With patient/family Time For Goal Achievement: 03/22/24 Potential to Achieve Goals: Fair  Plan         AM-PAC OT 6 Clicks Daily Activity     Outcome Measure   Help from another person eating meals?: None Help from another person taking care of personal grooming?: A Little Help from another person toileting, which includes using toliet, bedpan, or urinal?: A Lot Help from another person bathing (including washing, rinsing, drying)?: A Lot Help from another person to put on and taking off regular upper body clothing?: A Lot Help from another person to put on and taking off regular lower body clothing?: A Lot 6 Click Score: 15    End of Session    OT Visit Diagnosis: Unsteadiness on feet (R26.81);Repeated falls (R29.6);Muscle weakness (generalized) (M62.81)   Activity Tolerance Treatment limited secondary to medical complications (Comment);Patient tolerated treatment well   Patient Left in bed;with call bell/phone within reach;with bed alarm set;with nursing/sitter in room (NT present)   Nurse Communication Other (comment) (OT called RN in room after vital sign assessment. RN present for mobility and continued vital sign monitoring.)        Time: 1445-1517 OT Time Calculation (min): 32 min  Charges: OT General Charges $OT Visit: 1 Visit OT Treatments $Self Care/Home Management : 23-37 mins  Dyquan Minks L. Ankith Edmonston, OTR/L  03/10/24, 3:59 PM

## 2024-03-10 NOTE — Progress Notes (Signed)
 PT Cancellation Note  Patient Details Name: [name] Young. MRN: [id number] DOB: Dec 17, 1940   Cancelled Treatment:    Reason Eval/Treat Not Completed: Other (comment).  Per OT and nursing, pt's HR recently in 30's to 40's bpm, and recommending holding further therapy at this time.  Damien Caulk, PT 03/10/24, 4:00 PM

## 2024-03-10 NOTE — NC FL2 (Addendum)
 [place]  MEDICAID [id number] LEVEL OF CARE FORM     IDENTIFICATION  Patient Name: [name] Young. Birthdate: 08/18/1941 Sex: male Admission Date (Current Location): 03/06/2024  [facility] and [place] Number:  Chiropodist and Address:  [facility], [address] [place], [place] [address]      Provider Number: [id number]  Attending Physician Name and Address:  Caleen Qualia, MD  Relative Name and Phone Number:  [name] Marsico(Wife)[phone]    Current Level of Care: Hospital Recommended Level of Care: Skilled Nursing Facility Prior Approval Number:    Date Approved/Denied:   PASRR Number: [id number]  Discharge Plan: SNF    Current Diagnoses: Patient Active Problem List   Diagnosis Date Noted   Malnutrition of moderate degree 03/07/2024   Pressure injury of skin 03/07/2024   Perihepatic abscess (HCC) 03/06/2024   Essential hypertension 03/06/2024   Hyperkalemia 03/06/2024   AKI (acute kidney injury) (HCC) 03/06/2024   Fall at home, initial encounter 02/11/2024   Abdominal right lower quadrant swelling 02/11/2024   Hemothorax on right 11/20/2023   Bradycardia 04/25/2023   General weakness 04/25/2023   Chronic heart failure with mildly reduced ejection fraction (HFmrEF, 41-49%) (HCC) 03/06/2023   Frequent PVCs 03/06/2023   Insomnia 03/05/2023   Decubitus ulcer of right buttock, stage 1 01/04/2023   Aortic valve stenosis 01/02/2023   History of endocarditis 12/28/2022   Hepatic abscess 12/28/2022   Protein-calorie malnutrition, severe (HCC) 12/28/2022   Abnormal TSH 12/27/2022   Recurrent pleural effusion on right 12/21/2022   Pre-op evaluation 05/03/2022   CCC (chronic calculous cholecystitis) 04/25/2022   History of acute cholangitis 04/25/2022   Palliative care by specialist    Bacteremia due to Streptococcus 10/24/2021   Vitamin B12 deficiency 05/24/2020   Vitamin D  deficiency 05/24/2020   Early dry stage nonexudative  age-related macular degeneration of both eyes 12/01/2019   Pain due to onychomycosis of toenails of both feet 07/28/2019   Diabetic neuropathy (HCC) 07/28/2019   Hearing loss, right 05/22/2019   PAF (paroxysmal atrial fibrillation) (HCC) 02/08/2018   FTT (failure to thrive) in adult 01/28/2018   Anemia    Constipation    S/P CABG x 3 01/18/2018   Coronary artery disease 12/29/2017   Chronic inactive rheumatic heart disease 11/24/2017   SOB (shortness of breath) 11/05/2017   DNR (do not resuscitate) 06/14/2017   Peripheral neuropathy 06/14/2017   Health maintenance examination 12/12/2016   Obesity, Class I, BMI 30-34.9 12/12/2016   Advanced care planning/counseling discussion 10/30/2014   Benign prostatic hyperplasia 10/30/2014   CKD stage 3 due to type 2 diabetes mellitus (HCC) 07/08/2013   Medicare annual wellness visit, subsequent 10/11/2011   Type 2 diabetes mellitus with other specified complication (HCC)    Hyperlipidemia associated with type 2 diabetes mellitus (HCC)    History of kidney stones    GERD (gastroesophageal reflux disease)     Orientation RESPIRATION BLADDER Height & Weight        Normal Continent Weight: 98.1 kg Height:  5' 10 (177.8 cm)  BEHAVIORAL SYMPTOMS/MOOD NEUROLOGICAL BOWEL NUTRITION STATUS      Continent (with some incontinent episodes) Diet  AMBULATORY STATUS COMMUNICATION OF NEEDS Skin     Verbally Normal                       Personal Care Assistance Level of Assistance              Functional Limitations Info  SPECIAL CARE FACTORS FREQUENCY  PT (By licensed PT), OT (By licensed OT)     PT Frequency: 5x a wk OT Frequency: 5x a wk            Contractures Contractures Info: Not present    Additional Factors Info  Code Status Code Status Info: DNR             Current Medications (03/10/2024):  This is the current hospital active medication list Current Facility-Administered Medications  Medication  Dose Route Frequency Provider Last Rate Last Admin   acetaminophen  (TYLENOL ) tablet 650 mg  650 mg Oral Q6H PRN Cox, Amy N, DO   650 mg at 03/07/24 2213   Or   acetaminophen  (TYLENOL ) suppository 650 mg  650 mg Rectal Q6H PRN Cox, Amy N, DO       bisacodyl  (DULCOLAX) EC tablet 10 mg  10 mg Oral Daily PRN Cox, Amy N, DO       cyanocobalamin  (VITAMIN B12) tablet 1,000 mcg  1,000 mcg Oral Q M,W,F Cox, Amy N, DO   1,000 mcg at 03/10/24 9181   enoxaparin  (LOVENOX ) injection 40 mg  40 mg Subcutaneous QHS Amin, Sumayya, MD   40 mg at 03/09/24 2032   Fe Fum-Vit C-Vit B12-FA (TRIGELS-F FORTE) capsule 1 capsule  1 capsule Oral QPC breakfast Amin, Sumayya, MD   1 capsule at 03/10/24 0818   feeding supplement (ENSURE PLUS HIGH PROTEIN) liquid 237 mL  237 mL Oral TID BM Amin, Sumayya, MD   237 mL at 03/10/24 1340   hydrALAZINE  (APRESOLINE ) injection 5 mg  5 mg Intravenous Q6H PRN Cox, Amy N, DO       melatonin tablet 5 mg  5 mg Oral QHS PRN Cox, Amy N, DO   5 mg at 03/09/24 2325   midodrine (PROAMATINE) tablet 10 mg  10 mg Oral TID WC Amin, Sumayya, MD   10 mg at 03/10/24 1339   mirtazapine  (REMERON ) tablet 30 mg  30 mg Oral QHS Cox, Amy N, DO   30 mg at 03/09/24 2031   multivitamin with minerals tablet 1 tablet  1 tablet Oral Daily Amin, Sumayya, MD   1 tablet at 03/10/24 0818   ondansetron  (ZOFRAN ) tablet 4 mg  4 mg Oral Q6H PRN Cox, Amy N, DO       Or   ondansetron  (ZOFRAN ) injection 4 mg  4 mg Intravenous Q6H PRN Cox, Amy N, DO       piperacillin -tazobactam (ZOSYN ) IVPB 3.375 g  3.375 g Intravenous Q8H Fitzgerald, David P, MD       polyethylene glycol (MIRALAX  / GLYCOLAX ) packet 17 g  17 g Oral Daily PRN Amin, Sumayya, MD       rosuvastatin  (CRESTOR ) tablet 10 mg  10 mg Oral QHS Cox, Amy N, DO   10 mg at 03/09/24 2031   sodium chloride  flush (NS) 0.9 % injection 5 mL  5 mL Intracatheter Q8H Henn, Adam, MD   5 mL at 03/10/24 1340   sodium zirconium cyclosilicate (LOKELMA) packet 10 g  10 g Oral Daily  Amin, Sumayya, MD   10 g at 03/10/24 0818   tamsulosin  (FLOMAX ) capsule 0.4 mg  0.4 mg Oral QPC supper Amin, Sumayya, MD   0.4 mg at 03/09/24 1806     Discharge Medications: Please see discharge summary for a list of discharge medications.  Relevant Imaging Results:  Relevant Lab Results:   Additional Information SSN: [id number]  Marinda Cooks, RN

## 2024-03-10 NOTE — Progress Notes (Addendum)
 Slow to blanch redness to coccyx. No actual open areas appreciated during dressing change.Offloaded. T&R Q2H.  [name] Young V Rosalyn Archambault

## 2024-03-10 NOTE — Consult Note (Signed)
 Infectious Disease     Reason for Consult: liver abscess    Referring Physician: Dr Caleen Date of Admission:  03/06/2024   Principal Problem:   Perihepatic abscess (HCC) Active Problems:   Hyperlipidemia associated with type 2 diabetes mellitus (HCC)   Obesity, Class I, BMI 30-34.9   DNR (do not resuscitate)   S/P CABG x 3   FTT (failure to thrive) in adult   Anemia   PAF (paroxysmal atrial fibrillation) (HCC)   Protein-calorie malnutrition, severe (HCC)   Insomnia   General weakness   Essential hypertension   Hyperkalemia   AKI (acute kidney injury) (HCC)   Malnutrition of moderate degree   Pressure injury of skin   HPI: [name] Young. is a 83 y.o. male with a complicated medical history including A-fib, hypertension, chronic kidney disease, diabetes, prior history of streptococcal endocarditis, history of E. coli bacteremia, admitted with findings of perihepatic abscess on outpatient imaging.  He had a complicated course over the last few months and had recurrent pleural effusions requiring multiple thoracentesis complicated by right hemothorax and acute blood loss anemia in March.  He had been having some increasing abdominal pain at home and CT scan of his chest abdomen and pelvis was performed June 16.  This showed removal of right-sided pleural drain decreased size of loculated right pleural effusion decreased size of right pleural mass but a new subcapsular fluid collection adjacent to the inferior right hepatic lobe measuring up to 7.5 cm.  There is also intramuscular wall thickening along the right 11th and 12th ribs.  Patient underwent IR CT-guided aspiration of the right lateral abdominal wall right perihepatic fluid collection with 450 mL of bloody purulent fluid removed.  Gram stain showed moderate gram-positive cocci and rare gram-negative rods. Since admission his blood cultures have turned positive for Enterococcus which is vancomycin  resistant but sensitive to  ampicillin .  No follow-up blood cultures have been done. Since admission he was on cefepime  and metronidazole  and daptomycin was started June 28. Drain cx pending.   Previously he had seen Dr. Fayette in May 2024 for complicated issues as well with liver abscess growing E. coli on prior E. coli and strep mitral valve endocarditis.  Past Medical History:  Diagnosis Date   Adenomatous colon polyp    Aortic atherosclerosis (HCC)    Aortic stenosis    a. 11/2023 Echo: mild-mod AS, mean grad .   Arthritis    Ascending aorta dilatation (HCC)    a.) TTE 11/23/2017: asc Ao measured 37 mm; b.) TTE 10/23/2021: Ao root measured 41 mm, asc Ao measured 38 mm; c.) TEE 10/28/2021: asc Ao measured 38 mm; d.) TTE 01/11/2022: Ao root 40 mm, asc Ao 39 mm   Ascending cholangitis 10/2021   BPH (benign prostatic hyperplasia)    CKD (chronic kidney disease), stage III (HCC)    Claustrophobia    Coronary artery disease    a.) LHC 12/21/2017: 75% mLAD, 90% D1, 80-99% OM1/2, CTO pRCA (L-R collaterals) --> CVTS consult. b.) 01/2018 CABG x 3 (LIMA->LAD, VG->D1, VG->RPL); c. 01/2024 Cath: LM nl, LAD 20p, 7m, D1 70ost, 90, LCX 30ost, OM1 80, OM2 99- fills from collats from D1. RCA 100p CTO, RPAV 100 (fills via collats from 3rd septal), VG->RPDA nl, LIMA->LAD nl, VG->D1 nl. RHC PA 35/10 (8), PCWP 10, CO 6.5-->Med Rx.   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   In setting of bacteremia from ascending colangitis   GERD (  gastroesophageal reflux disease)    Hemothorax on right 11/2023   HFrEF (heart failure with reduced ejection fraction) (HCC)    a.) TTE 11/23/2017: EF 50-55%, mod MAC, triv TR, G1DD; b.) TTE 10/23/2021: EF 40-45%, mild LAE, Ao sclerosis, triv MR, G2DD; c.) TEE 10/28/2021: EF 40-45%, glob HK, mobile vegitation on MV; d.) TTE 01/11/2022: EF 50-55%, mild LVH, RVE, Ao sclerosis, mild MR/AR, G1DD   History of cholelithiasis    History of kidney stones    ca ox [place] [facility] now [facility]   History of pneumonia    HLD (hyperlipidemia)    HTN (hypertension)    Ischemic cardiomyopathy    a. 12/2022 Echo: EF 50-55%; b. 11/2023 Echo: EF 40-45%, glob HK, mild LVH, GrI DD, nl RV fxn, mild MR, mild AI, mild-mod AS, Ao root 43mm.   Jaundice    age 77   Long term current use of anticoagulant    a.) apixaban    Paroxysmal atrial fibrillation (HCC)    a.) CHA2DS2VASc = 6 (age x 2, HFrEF, HTN, vascular disease history, T2DM);  b.) rate/rhythm maintained on oral metoprolol  succinate; chronically anticoagulated with apixaban    Pleural effusion    a. 11/2023 CT chest: Large R ple effusion, partially loculated-->s/p pleurx.   Pneumonia    Right-sided carotid artery disease (HCC)    a.) carotid doppler 04/05/2022: 1-39% RICA   S/P CABG x 3    a.) LIMA-LAD, SVG-diagonal, SVG-PL branch of RCA   S/P cataract extraction and insertion of intraocular lens    T2DM (type 2 diabetes mellitus) (HCC) 2010   Past Surgical History:  Procedure Laterality Date   CARPAL TUNNEL RELEASE Bilateral    CATARACT EXTRACTION, BILATERAL     COLONOSCOPY  11/2012   11 adenomatous polyps, diverticulosis, rec rpt 1 yr [facility])   COLONOSCOPY  12/2013   3 polyps, diverticulosis, rec rpt 3 yrs [facility])   COLONOSCOPY  06/2019   6 polyps (TA), diverticulosis, f/u left open ended [facility])   CORONARY ARTERY BYPASS GRAFT N/A 01/18/2018   Procedure: CORONARY ARTERY BYPASS GRAFTING (CABG) x 3; Using Left Internal Mammary Artery, and Right Greater Saphenous Vein harvested Endoscopically, Coronary Artery Endarterectomy;  Surgeon: [name] Hanford Coy, MD;  Location: [facility] OR;  Service: Open Heart Surgery;  Laterality: N/A;   ERCP N/A 10/23/2021   Procedure: ENDOSCOPIC RETROGRADE CHOLANGIOPANCREATOGRAPHY (ERCP);  Surgeon: Aneita Gwendlyn DASEN, MD;  Location: [facility] ENDOSCOPY;  Service: Endoscopy;  Laterality: N/A;   IR CATHETER TUBE CHANGE  01/08/2023   IR PATIENT EVAL TECH 0-60 MINS  03/07/2024   IR PERC PLEURAL DRAIN  W/INDWELL CATH W/IMG GUIDE  11/20/2023   IR RADIOLOGIST EVAL & MGMT  01/23/2023   IR THORACENTESIS ASP PLEURAL SPACE W/IMG GUIDE  12/29/2022   KNEE CARTILAGE SURGERY Left    LEFT HEART CATH AND CORONARY ANGIOGRAPHY N/A 12/21/2017   Procedure: LEFT HEART CATH AND CORONARY ANGIOGRAPHY;  Surgeon: Mady Bruckner, MD;  Location: [facility] INVASIVE CV LAB;  Service: Cardiovascular;  Laterality: N/A;   LITHOTRIPSY     REMOVAL OF STONES  10/23/2021   Procedure: REMOVAL OF STONES;  Surgeon: Aneita Gwendlyn DASEN, MD;  Location: [facility] ENDOSCOPY;  Service: Endoscopy;;   RIGHT/LEFT HEART CATH AND CORONARY/GRAFT ANGIOGRAPHY Bilateral 01/25/2024   Procedure: RIGHT/LEFT HEART CATH AND CORONARY/GRAFT ANGIOGRAPHY;  Surgeon: Mady Bruckner, MD;  Location: [facility] INVASIVE CV LAB;  Service: Cardiovascular;  Laterality: Bilateral;   SPHINCTEROTOMY  10/23/2021   Procedure: SPHINCTEROTOMY;  Surgeon: Aneita Gwendlyn DASEN, MD;  Location: [facility]  ENDOSCOPY;  Service: Endoscopy;;   TEE WITHOUT CARDIOVERSION N/A 01/18/2018   Procedure: TRANSESOPHAGEAL ECHOCARDIOGRAM (TEE);  Surgeon: [name] Ochoa, Maude, MD;  Location: [facility] OR;  Service: Open Heart Surgery;  Laterality: N/A;   TEE WITHOUT CARDIOVERSION N/A 10/28/2021   Procedure: TRANSESOPHAGEAL ECHOCARDIOGRAM (TEE);  Surgeon: Mona Vinie BROCKS, MD;  Location: [facility] ENDOSCOPY;  Service: Cardiovascular;  Laterality: N/A;   UMBILICAL HERNIA REPAIR     with mesh   Social History   Tobacco Use   Smoking status: Never    Passive exposure: Past   Smokeless tobacco: Former    Types: Chew    Quit date: 09/08/2001  Vaping Use   Vaping status: Never Used  Substance Use Topics   Alcohol use: Not Currently    Comment: occ   Drug use: No   Family History  Problem Relation Age of Onset   Alzheimer's disease Mother    Breast cancer Mother        breast   Atrial fibrillation Mother    CAD Mother    Stroke Father 47   Diabetes Father    CAD Father    Colon polyps Sister    Colon polyps Brother    Rectal  cancer Maternal Grandfather        rectal   Colon cancer Maternal Grandfather 75   Esophageal cancer Neg Hx    Stomach cancer Neg Hx     Allergies: No Known Allergies  Current antibiotics: Antibiotics Given (last 72 hours)     Date/Time Action Medication Dose Rate   03/07/24 1639 New Bag/Given   ceFEPIme  (MAXIPIME ) 2 g in sodium chloride  0.9 % 100 mL IVPB 2 g 200 mL/hr   03/07/24 2056 New Bag/Given   metroNIDAZOLE  (FLAGYL ) IVPB 500 mg 500 mg 100 mL/hr   03/08/24 0448 New Bag/Given   ceFEPIme  (MAXIPIME ) 2 g in sodium chloride  0.9 % 100 mL IVPB 2 g 200 mL/hr   03/08/24 0858 New Bag/Given   metroNIDAZOLE  (FLAGYL ) IVPB 500 mg 500 mg 100 mL/hr   03/08/24 1048 New Bag/Given   DAPTOmycin (CUBICIN) 1,000 mg in sodium chloride  0.9 % IVPB 1,000 mg 140 mL/hr   03/08/24 1818 New Bag/Given   ceFEPIme  (MAXIPIME ) 2 g in sodium chloride  0.9 % 100 mL IVPB 2 g 200 mL/hr   03/08/24 2018 New Bag/Given   metroNIDAZOLE  (FLAGYL ) IVPB 500 mg 500 mg 100 mL/hr   03/09/24 0538 New Bag/Given   ceFEPIme  (MAXIPIME ) 2 g in sodium chloride  0.9 % 100 mL IVPB 2 g 200 mL/hr   03/09/24 0801 New Bag/Given   metroNIDAZOLE  (FLAGYL ) IVPB 500 mg 500 mg 100 mL/hr   03/09/24 1413 New Bag/Given   DAPTOmycin (CUBICIN) 1,000 mg in sodium chloride  0.9 % IVPB 1,000 mg 140 mL/hr   03/09/24 1708 New Bag/Given   ceFEPIme  (MAXIPIME ) 2 g in sodium chloride  0.9 % 100 mL IVPB 2 g 200 mL/hr   03/09/24 2031 New Bag/Given   metroNIDAZOLE  (FLAGYL ) IVPB 500 mg 500 mg 100 mL/hr   03/10/24 0411 New Bag/Given   ceFEPIme  (MAXIPIME ) 2 g in sodium chloride  0.9 % 100 mL IVPB 2 g 200 mL/hr   03/10/24 0817 New Bag/Given   metroNIDAZOLE  (FLAGYL ) IVPB 500 mg 500 mg 100 mL/hr       MEDICATIONS:  cyanocobalamin   1,000 mcg Oral Q M,W,F   enoxaparin  (LOVENOX ) injection  40 mg Subcutaneous QHS   Fe Fum-Vit C-Vit B12-FA  1 capsule Oral QPC breakfast   feeding supplement  237 mL  Oral TID BM   midodrine  10 mg Oral TID WC   mirtazapine   30 mg  Oral QHS   multivitamin with minerals  1 tablet Oral Daily   rosuvastatin   10 mg Oral QHS   sodium chloride  flush  5 mL Intracatheter Q8H   sodium zirconium cyclosilicate  10 g Oral Daily   tamsulosin   0.4 mg Oral QPC supper    Review of Systems - 11 systems reviewed and negative per HPI   OBJECTIVE: Temp:  [98 F (36.7 C)-98.4 F (36.9 C)] 98 F (36.7 C) (06/30 0726) Pulse Rate:  [60-72] 60 (06/30 0726) Resp:  [16-20] 16 (06/30 0726) BP: (92-113)/(37-54) 92/37 (06/30 0726) SpO2:  [98 %-100 %] 99 % (06/30 0726) Physical Exam  Constitutional: He is frail, chronically ill appearin, nad HENT: anicter Mouth/Throat: Oropharynx is clear and moist. No oropharyngeal exudate.  Cardiovascular: Normal rate, regular rhythm and normal heart sounds. Exam reveals no gallop and no friction rub.  No murmur heard.  Pulmonary/Chest: Effort normal and breath sounds normal. No respiratory distress. He has no wheezes.  Abdominal: mild ttp RUQ, JP drain with bloody drainage Lymphadenopathy:  He has no cervical adenopathy.  Neurological: He is alert and oriented to person, place, and time.  Skin: Skin is warm and dry. No rash noted. No erythema.  Psychiatric: He has a normal mood and affect. His behavior is normal.     LABS: Results for orders placed or performed during the hospital encounter of 03/06/24 (from the past 48 hours)  Hemoglobin and hematocrit, blood     Status: Abnormal   Collection Time: 03/08/24  4:59 PM  Result Value Ref Range   Hemoglobin 8.7 (L) 13.0 - 17.0 g/dL   HCT 71.2 (L) 60.9 - 47.9 %    Comment: Performed at [facility], [address] [place], [place] [address]  CBC     Status: Abnormal   Collection Time: 03/09/24  8:18 AM  Result Value Ref Range   WBC 8.1 4.0 - 10.5 K/uL   RBC 3.60 (L) 4.22 - 5.81 MIL/uL   Hemoglobin 8.8 (L) 13.0 - 17.0 g/dL   HCT 70.4 (L) 60.9 - 47.9 %   MCV 81.9 80.0 - 100.0 fL   MCH 24.4 (L) 26.0 - 34.0 pg   MCHC 29.8 (L) 30.0 -  36.0 g/dL   RDW 82.7 (H) 88.4 - 84.4 %   Platelets 329 150 - 400 K/uL   nRBC 0.0 0.0 - 0.2 %    Comment: Performed at [facility], [address] [place], [place] [address]  Basic metabolic panel     Status: Abnormal   Collection Time: 03/09/24  8:18 AM  Result Value Ref Range   Sodium 140 135 - 145 mmol/L   Potassium 5.1 3.5 - 5.1 mmol/L   Chloride 111 98 - 111 mmol/L   CO2 24 22 - 32 mmol/L   Glucose, Bld 106 (H) 70 - 99 mg/dL    Comment: Glucose reference range applies only to samples taken after fasting for at least 8 hours.   BUN 40 (H) 8 - 23 mg/dL   Creatinine, Ser 8.82 0.61 - 1.24 mg/dL   Calcium  8.1 (L) 8.9 - 10.3 mg/dL   GFR, Estimated >39 >39 mL/min    Comment: (NOTE) Calculated using the CKD-EPI Creatinine Equation (2021)    Anion gap 5 5 - 15    Comment: Performed at [facility], [address] [place], [place] [address]  No components found for: ESR, C REACTIVE PROTEIN MICRO: Recent Results (from the past 720 hours)  Blood Culture (routine x 2)     Status: Abnormal   Collection Time: 03/06/24 11:56 AM   Specimen: BLOOD LEFT ARM  Result Value Ref Range Status   Specimen Description   Final    BLOOD LEFT ARM Performed at [facility], [address], [place], [place] [address]    Special Requests   Final    BOTTLES DRAWN AEROBIC AND ANAEROBIC Blood Culture adequate volume Performed at [facility], [address] [place], [place] [address]    Culture  Setup Time   Final    GRAM POSITIVE COCCI ANAEROBIC BOTTLE ONLY CRITICAL RESULT CALLED TO, READ BACK BY AND VERIFIED WITH: ALEX CHAPPELL AT 0802 03/08/24.PMF Performed at [facility], [address] [place], [place] [address]    Culture (A)  Final    ENTEROCOCCUS FAECALIS VANCOMYCIN  RESISTANT ENTEROCOCCUS ISOLATED    Report Status 03/10/2024 FINAL  Final   Organism ID, Bacteria ENTEROCOCCUS FAECALIS  Final      Susceptibility   Enterococcus faecalis  - MIC*    AMPICILLIN  <=2 SENSITIVE Sensitive     VANCOMYCIN  >=32 RESISTANT Resistant     GENTAMICIN SYNERGY SENSITIVE Sensitive     * ENTEROCOCCUS FAECALIS  Blood Culture ID Panel (Reflexed)     Status: Abnormal   Collection Time: 03/06/24 11:56 AM  Result Value Ref Range Status   Enterococcus faecalis DETECTED (A) NOT DETECTED Final    Comment: CRITICAL RESULT CALLED TO, READ BACK BY AND VERIFIED WITH: ALEX CHAPPELL AT 0802 03/08/24.PMF    Enterococcus Faecium NOT DETECTED NOT DETECTED Final   Listeria monocytogenes NOT DETECTED NOT DETECTED Final   Staphylococcus species NOT DETECTED NOT DETECTED Final   Staphylococcus aureus (BCID) NOT DETECTED NOT DETECTED Final   Staphylococcus epidermidis NOT DETECTED NOT DETECTED Final   Staphylococcus lugdunensis NOT DETECTED NOT DETECTED Final   Streptococcus species NOT DETECTED NOT DETECTED Final   Streptococcus agalactiae NOT DETECTED NOT DETECTED Final   Streptococcus pneumoniae NOT DETECTED NOT DETECTED Final   Streptococcus pyogenes NOT DETECTED NOT DETECTED Final   A.calcoaceticus-baumannii NOT DETECTED NOT DETECTED Final   Bacteroides fragilis NOT DETECTED NOT DETECTED Final   Enterobacterales NOT DETECTED NOT DETECTED Final   Enterobacter cloacae complex NOT DETECTED NOT DETECTED Final   Escherichia coli NOT DETECTED NOT DETECTED Final   Klebsiella aerogenes NOT DETECTED NOT DETECTED Final   Klebsiella oxytoca NOT DETECTED NOT DETECTED Final   Klebsiella pneumoniae NOT DETECTED NOT DETECTED Final   Proteus species NOT DETECTED NOT DETECTED Final   Salmonella species NOT DETECTED NOT DETECTED Final   Serratia marcescens NOT DETECTED NOT DETECTED Final   Haemophilus influenzae NOT DETECTED NOT DETECTED Final   Neisseria meningitidis NOT DETECTED NOT DETECTED Final   Pseudomonas aeruginosa NOT DETECTED NOT DETECTED Final   Stenotrophomonas maltophilia NOT DETECTED NOT DETECTED Final   Candida albicans NOT DETECTED NOT DETECTED Final    Candida auris NOT DETECTED NOT DETECTED Final   Candida glabrata NOT DETECTED NOT DETECTED Final   Candida krusei NOT DETECTED NOT DETECTED Final   Candida parapsilosis NOT DETECTED NOT DETECTED Final   Candida tropicalis NOT DETECTED NOT DETECTED Final   Cryptococcus neoformans/gattii NOT DETECTED NOT DETECTED Final   Vancomycin  resistance DETECTED (A) NOT DETECTED Final    Comment: CRITICAL RESULT CALLED TO, READ BACK BY AND VERIFIED WITH: ALEX CHAPPELL AT 0802 03/08/24.PMF Performed  at [facility], [address] [place], [place] [address]   Blood Culture (routine x 2)     Status: None (Preliminary result)   Collection Time: 03/06/24  2:25 PM   Specimen: BLOOD  Result Value Ref Range Status   Specimen Description BLOOD BLOOD LEFT HAND [facility]  Final   Special Requests   Final    BOTTLES DRAWN AEROBIC AND ANAEROBIC Blood Culture adequate volume   Culture   Final    NO GROWTH 4 DAYS Performed at [facility], [address] [place], [place] [address]    Report Status PENDING  Incomplete  Aerobic/Anaerobic Culture w Gram Stain (surgical/deep wound)     Status: None (Preliminary result)   Collection Time: 03/07/24  4:00 PM   Specimen: Abscess  Result Value Ref Range Status   Specimen Description   Final    ABSCESS Performed at [facility], [address], [place], [place] [address]    Special Requests   Final    NONE Performed at [facility], [address] [place], [place] [address]    Gram Stain   Final    MODERATE WBC PRESENT, PREDOMINANTLY PMN MODERATE GRAM POSITIVE COCCI RARE GRAM NEGATIVE RODS Performed at [facility], [address] [place], [place] [address]    Culture   Final    CULTURE REINCUBATED FOR BETTER GROWTH NO ANAEROBES ISOLATED; CULTURE IN PROGRESS FOR 5 DAYS    Report Status PENDING  Incomplete    IMAGING: CT ANGIO ABDOMEN W &/OR WO CONTRAST Result Date: 03/08/2024 CLINICAL DATA:  Left upper  quadrant pain. Perihepatic drain with active bleeding. EXAM: CT ANGIOGRAPHY ABDOMEN TECHNIQUE: Multidetector CT imaging of the abdomen was performed using the standard protocol during bolus administration of intravenous contrast. Multiplanar reconstructed images and MIPs were obtained and reviewed to evaluate the vascular anatomy. RADIATION DOSE REDUCTION: This exam was performed according to the departmental dose-optimization program which includes automated exposure control, adjustment of the mA and/or kV according to patient size and/or use of iterative reconstruction technique. CONTRAST:  OMNIPAQUE  IOHEXOL  350 MG/ML SOLN COMPARISON:  03/06/2024. FINDINGS: VASCULAR Aorta: Normal caliber aorta without aneurysm, dissection, vasculitis or significant stenosis. Aortic atherosclerosis. Celiac: Patent without evidence of aneurysm, dissection, vasculitis or significant stenosis. SMA: Patent without evidence of aneurysm, dissection, vasculitis or significant stenosis. Renals: Both renal arteries are patent without evidence of aneurysm, dissection, vasculitis, fibromuscular dysplasia or significant stenosis. IMA: Patent without evidence of aneurysm, dissection, vasculitis or significant stenosis. Inflow: Patent without evidence of aneurysm, dissection, vasculitis or significant stenosis. Veins: No obvious venous abnormality within the limitations of this arterial phase study. Review of the MIP images confirms the above findings. NON-VASCULAR Lower chest: There is a trace right pleural effusion with with associated pleural thickening, unchanged. Atelectasis is noted at the lung bases bilaterally. Coronary artery calcifications are noted. Hepatobiliary: No focal abnormality is seen within the liver. The gallbladder is surgically absent. No biliary ductal dilatation is seen. There is a perihepatic complex collection with a drain terminating in the posterior perihepatic space. The collection continues into the lateral  abdominal wall on the right. No evidence of contrast extravasation is seen to suggest active hemorrhage. Pancreas: Unremarkable. No pancreatic ductal dilatation or surrounding inflammatory changes. Spleen: Normal in size without focal abnormality. Adrenals/Urinary Tract: The adrenal glands are within normal limits. The kidneys enhance symmetrically. Renal calculi are noted on the right. Hypodensities are noted in the kidneys bilaterally, likely cysts. No hydronephrosis bilaterally.  Stomach/Bowel: There is a small hiatal hernia. No bowel obstruction, free air, or pneumatosis is seen. Scattered diverticula are present along the colon without evidence of diverticulitis. Lymphatic: Prominent lymph node is noted at the porta pedis, likely reactive. Other: Fat stranding is noted in the abdomen with a small amount of perihepatic free fluid. There is subcutaneous fat stranding and skin thickening along the right lateral abdominal wall. Musculoskeletal: Strongly wires are noted. Degenerative changes are present in the thoracolumbar spine. No acute osseous abnormality is seen. IMPRESSION: VASCULAR 1. No evidence of active hemorrhage. 2. Aortic atherosclerosis. NON-VASCULAR 1. Complex collection at the right lung base extending into the. Paddock space and right abdominal wall, decreased in size from the prior exam. Percutaneous drain placement is noted posteriorly. No active hemorrhage is seen. 2. Remaining findings are unchanged. Electronically Signed   By: Leita Birmingham M.D.   On: 03/08/2024 19:07   CT GUIDED PERITONEAL/RETROPERITONEAL FLUID DRAIN BY PERC CATH Result Date: 03/07/2024 INDICATION: 83 year old with history of right hemithorax and now has a large complex fluid collection in the right lateral abdominal wall and right perihepatic space. EXAM: CT and ultrasound-guided placement of drainage catheter in the right lateral abdominal wall collection TECHNIQUE: Multidetector CT imaging of the abdomen was performed  following the standard protocol without IV contrast. RADIATION DOSE REDUCTION: This exam was performed according to the departmental dose-optimization program which includes automated exposure control, adjustment of the mA and/or kV according to patient size and/or use of iterative reconstruction technique. MEDICATIONS: Moderate sedation ANESTHESIA/SEDATION: Moderate (conscious) sedation was employed during this procedure. A total of Versed  2 mg and Fentanyl  100 mcg was administered intravenously by the radiology nurse. Total intra-service moderate Sedation Time: 14 minutes. The patient's level of consciousness and vital signs were monitored continuously by radiology nursing throughout the procedure under my direct supervision. COMPLICATIONS: None immediate. PROCEDURE: Informed written consent was obtained from the patient after a thorough discussion of the procedural risks, benefits and alternatives. All questions were addressed. Maximal Sterile Barrier Technique was utilized including caps, mask, sterile gowns, sterile gloves, sterile drape, hand hygiene and skin antiseptic. A timeout was performed prior to the initiation of the procedure. Patient was placed on his left side. CT images through the abdomen were obtained. Complex collection involving the lateral right abdominal wall and perihepatic space was identified. This area was also evaluated with ultrasound. The right lateral abdomen was prepped with chlorhexidine  and sterile field was created. Skin was anesthetized using 1% lidocaine . Small incision was made. Using ultrasound guidance, 18 gauge trocar needle was directed into the lateral superficial component. Yellow purulent fluid was aspirated. Superstiff Amplatz wire was advanced into the collection. Wire appeared to be extending into the perihepatic region as well. Tract was dilated to accommodate a 12 [place] multipurpose drain. Initially, a yellow purulent fluid was removed and eventually turned into  bloody fluid. Total of 450 mL of bloody purulent fluid was removed. Follow up CT and ultrasound images were obtained. Drain was sutured to skin and attached to a suction bulb. FINDINGS: Large complex fluid collection involving the lateral right abdominal wall and right perihepatic space. Twelve French drain was placed in the superficial component of the collection and extends into the perihepatic region. 450 mL of bloody purulent fluid was removed. Follow up CT and ultrasound images demonstrate residual hematoma but no other significant drainable fluid. IMPRESSION: CT and ultrasound-guided placement of a drainage catheter in the right lateral abdominal wall and right perihepatic fluid collection. 450 mL of  bloody purulent fluid was removed and findings are suggestive for an infected hematoma. Electronically Signed   By: Juliene Balder M.D.   On: 03/07/2024 16:32   IR PATIENT EVAL TECH 0-60 MINS Result Date: 03/07/2024 Delight Bebe SAILOR     03/07/2024  4:05 PM Assistance with CT drain placement  CT ABDOMEN PELVIS W CONTRAST Result Date: 03/06/2024 CLINICAL DATA:  Perihepatic abscess. EXAM: CT ABDOMEN AND PELVIS WITH CONTRAST TECHNIQUE: Multidetector CT imaging of the abdomen and pelvis was performed using the standard protocol following bolus administration of intravenous contrast. RADIATION DOSE REDUCTION: This exam was performed according to the departmental dose-optimization program which includes automated exposure control, adjustment of the mA and/or kV according to patient size and/or use of iterative reconstruction technique. CONTRAST:  75mL OMNIPAQUE  IOHEXOL  300 MG/ML  SOLN COMPARISON:  CT chest and pelvis dated 02/25/2024. FINDINGS: Lower chest: Similar appearance of small right pleural effusion and right posterior pleural thickening or mass. Additional subpleural nodule in the right middle lobe similar to prior CT. Coronary vascular calcification. No intra-abdominal free air.  Small perihepatic free  fluid. Hepatobiliary: Complex collection extending from the subcapsular posterior right lobe of the liver into the peritoneal surface posteriorly as well as posterolateral abdominal wall measures approximately 9.6 x 5.4 cm in greatest axial dimensions posterior to the liver and 14 x 7 cm in the right lateral abdominal wall and overall slightly increased in size since the prior CT. No biliary dilatation. Cholecystectomy. Pancreas: Unremarkable. No pancreatic ductal dilatation or surrounding inflammatory changes. Spleen: Normal in size without focal abnormality. Adrenals/Urinary Tract: The adrenal glands unremarkable. Right renal nonobstructing calculi measure up to 5 mm in the interpolar right kidney. No hydronephrosis. Bilateral renal cysts. The visualized ureters and urinary bladder appear unremarkable. Stomach/Bowel: Small hiatal hernia. There is moderate stool throughout the colon. There is sigmoid diverticulosis. There is no bowel obstruction or active inflammation. The appendix is not visualized with certainty. No inflammatory changes identified in the right lower quadrant. Vascular/Lymphatic: Moderate aortoiliac atherosclerotic disease. The IVC is unremarkable. No portal venous gas. There is no adenopathy. Reproductive: The prostate and seminal vesicles are grossly unremarkable. Other: Small fat containing bilateral inguinal hernia. Musculoskeletal: Osteopenia with degenerative changes of the spine. No acute osseous pathology. IMPRESSION: 1. Slightly increased size of the complex collection extending from the subcapsular posterior right lobe of the liver into the posterior peritoneal surface and posterolateral abdominal wall. 2. Similar appearance of small right pleural effusion and right posterior pleural thickening or mass. 3. Sigmoid diverticulosis. No bowel obstruction. 4. Right renal nonobstructing calculi. No hydronephrosis. 5.  Aortic Atherosclerosis (ICD10-I70.0). Electronically Signed   By: Vanetta Chou M.D.   On: 03/06/2024 14:34   DG Chest Port 1 View Result Date: 03/06/2024 CLINICAL DATA:  Sepsis. EXAM: PORTABLE CHEST 1 VIEW COMPARISON:  December 06, 2023. FINDINGS: The heart size and mediastinal contours are within normal limits. Status post coronary artery bypass graft. Both lungs are clear. The visualized skeletal structures are unremarkable. IMPRESSION: No active disease. Electronically Signed   By: Lynwood Landy Raddle M.D.   On: 03/06/2024 12:30   LONG TERM MONITOR (3-14 DAYS) Result Date: 03/05/2024   The patient was monitored for 7 days, 14 hours.   The predominant rhythm was sinus with an average rate of 85 bpm (range 58-120 bpm in sinus).   There were rare PACs and frequent PVCs (PVC burden ~30%).   917 episodes of nonsustained ventricular tachycardia were observed, lasting up to 10 beats with  a maximum rate of 218 bpm.   102 supraventricular runs were observed, lasting up to 15 beats with a maximum rate of 160 bpm.   No sustained arrhythmia or prolonged pause was observed.   There were no patient triggered events. Predominantly sinus rhythm with frequent PVCs and numerous episodes of nonsustained ventricular tachycardia and brief supraventricular runs, as outlined above.  Consider electrophysiology consultation.   CT CHEST ABDOMEN PELVIS WO CONTRAST Result Date: 03/03/2024 CLINICAL DATA:  Congestive heart failure. Fluid retention and urinary frequency. EXAM: CT CHEST, ABDOMEN AND PELVIS WITHOUT CONTRAST TECHNIQUE: Multidetector CT imaging of the chest, abdomen and pelvis was performed following the standard protocol without IV contrast. RADIATION DOSE REDUCTION: This exam was performed according to the departmental dose-optimization program which includes automated exposure control, adjustment of the mA and/or kV according to patient size and/or use of iterative reconstruction technique. COMPARISON:  CT of the chest 11/23/2023. CT abdomen and pelvis 01/23/2023. FINDINGS: CT CHEST FINDINGS  Cardiovascular: Heart is mildly enlarged. Aorta is normal in size. There is no pericardial effusion. There are atherosclerotic calcifications of the aorta and coronary arteries. Patient is status post cardiac surgery. Mediastinum/Nodes: No enlarged mediastinal, hilar, or axillary lymph nodes. Thyroid  gland, trachea, and esophagus demonstrate no significant findings. Lungs/Pleura: There is a small hiatal hernia. Right-sided pleural drain has been removed in the interval. Small loculated right pleural effusion has decreased in size. Hyperdense posterior right pleural mass has decreased in size now measuring up to 2.8 cm in thickness (previously 4.3 cm). There is no new focal lung infiltrate, pleural effusion or pneumothorax. Musculoskeletal: No acute fractures are seen. Sternotomy are present. Bilateral gynecomastia. CT ABDOMEN PELVIS FINDINGS Hepatobiliary: Subcapsular fluid collection adjacent to the inferior right hepatic lobe also abuts the peritoneal surface. Is measures 6.9 x 7.5 x 4.4 cm and appears new from the prior study. Second tiny subcapsular fluid collection is seen measuring up to 4 mm in thickness image 2/58. Gallbladder surgically absent. No biliary ductal dilatation identified. Pancreas: Unremarkable. No pancreatic ductal dilatation or surrounding inflammatory changes. Spleen: Normal in size without focal abnormality. Adrenals/Urinary Tract: There are 2 right renal calculi measuring up to 3 mm. Bilateral renal cysts are present measuring up to 4 cm. No hydronephrosis. Adrenal glands and bladder are within normal limits. Stomach/Bowel: Stomach is within normal limits. Appendix appears normal. No evidence of bowel wall thickening, distention, or inflammatory changes. There is sigmoid and descending colon diverticulosis. The appendix is not visualized. Vascular/Lymphatic: Aortic atherosclerosis. No enlarged abdominal or pelvic lymph nodes. Reproductive: Prostate gland is enlarged. Other: There is no  ascites. There are small fat containing inguinal hernias. There is intramuscular wall thickening and overlying subcutaneous edema at the level of the right eleventh and twelfth rib adjacent to the right liver subcapsular fluid collection. This area measures approximately 5 x 13 by 9.7 cm. Musculoskeletal: No osseous erosion or acute fracture. IMPRESSION: 1. Interval removal of right-sided pleural drain with decrease in size of small loculated right pleural effusion. 2. Interval decrease in size of hyperdense posterior right pleural mass. 3. New subcapsular fluid collection adjacent to the inferior right hepatic lobe measuring up to 7.5 cm. Abscess is a possibility. Second tiny subcapsular fluid collection is seen. 4. Intramuscular wall thickening and overlying subcutaneous edema at the level of the right eleventh and twelfth rib adjacent to the right liver subcapsular fluid collection. Findings may be related to hematoma, infection, or neoplasm. 5. Nonobstructing right renal calculi. 6. Colonic diverticulosis. 7. Prostatomegaly. 8. Aortic atherosclerosis.  Aortic Atherosclerosis (ICD10-I70.0). Electronically Signed   By: Greig Pique M.D.   On: 03/03/2024 23:19    Assessment:   [name] Young. is a 83 y.o. male with a complicated medical history including A-fib, hypertension, chronic kidney disease, diabetes, prior history of streptococcal endocarditis, history of E. coli bacteremia, admitted with findings of perihepatic abscess on outpatient imaging.  He had a complicated course over the last few months and had recurrent pleural effusions requiring multiple thoracentesis complicated by right hemothorax and acute blood loss anemia in March.  He had been having some increasing abdominal pain at home and CT scan of his chest abdomen and pelvis was performed June 16.  This showed removal of right-sided pleural drain decreased size of loculated right pleural effusion decreased size of right pleural mass but a  new subcapsular fluid collection adjacent to the inferior right hepatic lobe measuring up to 7.5 cm.  There is also intramuscular wall thickening along the right 11th and 12th ribs.  Patient underwent IR CT-guided aspiration of the right lateral abdominal wall right perihepatic fluid collection with 450 mL of bloody purulent fluid removed.  Gram stain showed moderate gram-positive cocci and rare gram-negative rods. Since admission his blood cultures have turned positive for Enterococcus which is vancomycin  resistant but sensitive to ampicillin .  No follow-up blood cultures have been done. Since admission he was on cefepime  and metronidazole  and daptomycin was started June 28. Drain cx pending.   Recommendations Enterococcal bacteremia- amp S. Source liver abscess. Repeat Bcx Echo - likely needs TEE given prior endocarditis history and high chance of endocarditis.  Liver abscess- s/p drain. GS mixed enterococcus and proteus  Consolidate to Zosyn    Thank you very much for allowing me to participate in the care of this patient. Please call with questions.   Alm SQUIBB. Epifanio, MD

## 2024-03-11 ENCOUNTER — Inpatient Hospital Stay: Admit: 2024-03-11

## 2024-03-11 ENCOUNTER — Inpatient Hospital Stay
Admit: 2024-03-11 | Discharge: 2024-03-11 | Disposition: A | Discharge status: Home / Self Care | Attending: Infectious Diseases | Admitting: Infectious Diseases

## 2024-03-11 DIAGNOSIS — D649 Anemia, unspecified: Secondary | ICD-10-CM | POA: Diagnosis not present

## 2024-03-11 DIAGNOSIS — E875 Hyperkalemia: Secondary | ICD-10-CM | POA: Diagnosis not present

## 2024-03-11 DIAGNOSIS — R7881 Bacteremia: Secondary | ICD-10-CM | POA: Diagnosis not present

## 2024-03-11 DIAGNOSIS — N179 Acute kidney failure, unspecified: Secondary | ICD-10-CM | POA: Diagnosis not present

## 2024-03-11 DIAGNOSIS — K650 Generalized (acute) peritonitis: Secondary | ICD-10-CM | POA: Diagnosis not present

## 2024-03-11 LAB — BASIC METABOLIC PANEL WITH GFR
Anion gap: 8 (ref 5–15)
BUN: 29 mg/dL — ABNORMAL HIGH (ref 8–23)
CO2: 22 mmol/L (ref 22–32)
Calcium: 8.4 mg/dL — ABNORMAL LOW (ref 8.9–10.3)
Chloride: 109 mmol/L (ref 98–111)
Creatinine, Ser: 1 mg/dL (ref 0.61–1.24)
GFR, Estimated: >60 mL/min (ref >60)
Glucose, Bld: 111 mg/dL — ABNORMAL HIGH (ref 70–99)
Potassium: 4.8 mmol/L (ref 3.5–5.1)
Sodium: 139 mmol/L (ref 135–145)

## 2024-03-11 LAB — ECHOCARDIOGRAM COMPLETE
AR max vel: 1.32 cm2
AV Area VTI: 1.53 cm2
AV Area mean vel: 1.26 cm2
AV Mean grad: 16 mmHg
AV Peak grad: 27.7 mmHg
Ao pk vel: 2.63 m/s
Area-P 1/2: 2.38 cm2
Height: 70 in
MV VTI: 2.71 cm2
S' Lateral: 3.6 cm
Weight: 3460.34 [oz_av]

## 2024-03-11 LAB — CULTURE, BLOOD (ROUTINE X 2)
Culture: NO GROWTH
Special Requests: ADEQUATE

## 2024-03-11 LAB — PREPARE RBC (CROSSMATCH)

## 2024-03-11 LAB — CBC
HCT: 30.7 % — ABNORMAL LOW (ref 39.0–52.0)
Hemoglobin: 9 g/dL — ABNORMAL LOW (ref 13.0–17.0)
MCH: 24.5 pg — ABNORMAL LOW (ref 26.0–34.0)
MCHC: 29.3 g/dL — ABNORMAL LOW (ref 30.0–36.0)
MCV: 83.4 fL (ref 80.0–100.0)
Platelets: 323 10*3/uL (ref 150–400)
RBC: 3.68 MIL/uL — ABNORMAL LOW (ref 4.22–5.81)
RDW: 17.3 % — ABNORMAL HIGH (ref 11.5–15.5)
WBC: 9.9 10*3/uL (ref 4.0–10.5)
nRBC: 0 % (ref 0.0–0.2)

## 2024-03-11 MED ORDER — SODIUM CHLORIDE 0.9 % IV SOLN
3.0000 g | Freq: Four times a day (QID) | INTRAVENOUS | Status: DC
  Administered 2024-03-11 – 2024-03-17 (×22): 3 g via INTRAVENOUS
  Filled 2024-03-11 (×24): qty 8

## 2024-03-11 MED ORDER — APIXABAN 5 MG PO TABS
5.0000 mg | ORAL_TABLET | Freq: Two times a day (BID) | ORAL | Status: DC
  Administered 2024-03-11 – 2024-03-17 (×13): 5 mg via ORAL
  Filled 2024-03-11 (×13): qty 1

## 2024-03-11 MED ORDER — SODIUM CHLORIDE 0.9 % IV SOLN: INTRAVENOUS | Status: DC

## 2024-03-11 NOTE — Progress Notes (Signed)
   [facility] has been requested to perform a transesophageal echocardiogram on [name] Young. for bacteremia.     The patient does NOT have any absolute or relative contraindications to a Transesophageal Echocardiogram (TEE).  The patient has: No other conditions that may impact this procedure.  After careful review of history and examination, the risks and benefits of transesophageal echocardiogram have been explained including risks of esophageal damage, perforation (1:10,000 risk), bleeding, pharyngeal hematoma as well as other potential complications associated with conscious sedation including aspiration, arrhythmia, respiratory failure and death. Alternatives to treatment were discussed, questions were answered. Patient is willing to proceed.   Signed, Lesley LITTIE Maffucci, PA-C  03/11/2024 4:17 PM

## 2024-03-11 NOTE — Progress Notes (Signed)
 Occupational Therapy Treatment Patient Details Name: [name] Young. MRN: [id number] DOB: 05/10/1941 Today's Date: 03/11/2024   History of present illness Pt is a 83 y.o. male admitted with perihepatic abscess, s/p CT/US  guided drainage with RUQ drain placement on 6/27, AKI, & protein-calorie malnutrition. PMH significant for CABGx3, CHF, DOE, AFIB on Eliquis , HTN, CKD 3a, DM, endocarditis with Streptococcus bacteremia, & E. coli bacteremia   OT comments  Pt seen for OT tx. Spouse present and supportive throughout. Pt endorses fatigue but agreeable. Pt required increased time/effort and cues for hand placement/bed rail use to complete sup>sit but no direct physical assist required. Once EOB, pt demo's fair static sitting balance initially requiring BUE vs UE support EOB, improving to no UE support while completing grooming tasks with set up and supv. Pt tolerated sitting EOB for ~75min before endorsing significant fatigue and requesting return to bed. Also endorsing possible need for BM. MOD A for BLE for sit >supine and pt able to help pull himself up in bed with assist from headboard and bed positioning. Pt/spouse educated in benefits of EOB ADL participation throughout the day (meals, dressing, grooming, etc) to improve activity tolerance. Pt/spouse verbalized understanding. VSS throughout. Pt progressing.       If plan is discharge home, recommend the following:  A lot of help with walking and/or transfers;A lot of help with bathing/dressing/bathroom;Help with stairs or ramp for entrance;Assist for transportation;Direct supervision/assist for financial management;Direct supervision/assist for medications management;Supervision due to cognitive status;Assistance with cooking/housework   Equipment Recommendations  Other (comment) (defer)    Recommendations for Other Services      Precautions / Restrictions Precautions Precautions: Fall Recall of Precautions/Restrictions:  Impaired Precaution/Restrictions Comments: JP drain RLQ, monitor BP and HR Restrictions Weight Bearing Restrictions Per Provider Order: No       Mobility Bed Mobility Overal bed mobility: Needs Assistance Bed Mobility: Supine to Sit, Sit to Supine     Supine to sit: Contact guard, HOB elevated, Used rails Sit to supine: Mod assist   General bed mobility comments: MOD A for BLE mgt back to bed    Transfers                   General transfer comment: declined     Balance Overall balance assessment: Needs assistance Sitting-balance support: No upper extremity supported, Feet supported, Single extremity supported Sitting balance-Leahy Scale: Fair                                     ADL either performed or assessed with clinical judgement   ADL Overall ADL's : Needs assistance/impaired     Grooming: Sitting;Wash/dry face;Oral care;Set up;Supervision/safety Grooming Details (indicate cue type and reason): tolerated well                                    Extremity/Trunk Assessment              Vision       Perception     Praxis     Communication Communication Communication: Impaired Factors Affecting Communication: Hearing impaired   Cognition Arousal: Alert Behavior During Therapy: WFL for tasks assessed/performed Cognition: History of cognitive impairments  Following commands: Impaired Following commands impaired: Follows one step commands with increased time      Cueing   Cueing Techniques: Verbal cues, Gestural cues, Tactile cues  Exercises Other Exercises Other Exercises: Pt educated in benefits of EOB ADL participation throuhgout the day to improve activity tolerance    Shoulder Instructions       General Comments Pre session: HR: 66 bpm, SpO2: 100% on room air, BP: 113/52 (72) supine, With sitting EOB: HR: 78 bpm, SpO2: 100% on room air, BP: 111/61 (75) seated,  With standing trials: HR: 92 bpm, SpO2: 99+% on room air, Post session: HR: 68 bpm, SpO2: 100% on room air. RLQ JP drain: ~3/4 full, secure, intact and with small serous exudate at top of dressing, no change noted throughout session.    Pertinent Vitals/ Pain       Pain Assessment Pain Assessment: No/denies pain  Home Living                                          Prior Functioning/Environment              Frequency  Min 2X/week        Progress Toward Goals  OT Goals(current goals can now be found in the care plan section)  Progress towards OT goals: Progressing toward goals  Acute Rehab OT Goals Patient Stated Goal: get better OT Goal Formulation: With patient/family Time For Goal Achievement: 03/22/24 Potential to Achieve Goals: Fair  Plan      Co-evaluation                 AM-PAC OT 6 Clicks Daily Activity     Outcome Measure   Help from another person eating meals?: None Help from another person taking care of personal grooming?: A Little Help from another person toileting, which includes using toliet, bedpan, or urinal?: A Lot Help from another person bathing (including washing, rinsing, drying)?: A Lot Help from another person to put on and taking off regular upper body clothing?: A Lot Help from another person to put on and taking off regular lower body clothing?: A Lot 6 Click Score: 15    End of Session    OT Visit Diagnosis: Unsteadiness on feet (R26.81);Repeated falls (R29.6);Muscle weakness (generalized) (M62.81)   Activity Tolerance Patient tolerated treatment well   Patient Left in bed;with call bell/phone within reach;with bed alarm set;with family/visitor present   Nurse Communication          Time: 1446-1510 OT Time Calculation (min): 24 min  Charges: OT General Charges $OT Visit: 1 Visit OT Treatments $Self Care/Home Management : 23-37 mins  Warren SAUNDERS., MPH, MS, OTR/L ascom [phone] 03/11/24, 3:27  PM

## 2024-03-11 NOTE — Progress Notes (Signed)
 Physical Therapy Treatment Patient Details Name: [name] Young. MRN: [id number] DOB: [birth year] Today's Date: 03/11/2024   History of Present Illness Pt is a 83 y.o. male admitted with perihepatic abscess, s/p CT/US  guided drainage with RUQ drain placement on 6/27, AKI, & protein-calorie malnutrition. PMH significant for CABGx3, CHF, DOE, AFIB on Eliquis , HTN, CKD 3a, DM, endocarditis with Streptococcus bacteremia, & E. coli bacteremia    PT Comments  Pt is supine in bed with HOB elevated upon entry, and agreeable to therapy. PT focused today's session on increasing activity tolerance while maintaining hemostatic stability (due to reports of low BP in past therapy sessions). During session pt was able to perform bed mobility (rolling and supine to sit) with mod A, performed 2 trials of STS from an elevated bed height towards RW with mod A, and required 2+ min/mod assistance to get back in bed and reposition towards HOB. During session, pt demonstrated increased activity tolerance, increasing from bed level activities to standing with vitals staying within appropriate therapeutic ranges compared to last session (see general comment section below for vital details). Pt would benefit from skilled PT to continue to work towards PT goals and return to PLOF.        If plan is discharge home, recommend the following: A lot of help with walking and/or transfers;A lot of help with bathing/dressing/bathroom;Assistance with cooking/housework;Assist for transportation;Help with stairs or ramp for entrance;Supervision due to cognitive status   Can travel by private vehicle     No  Equipment Recommendations  None recommended by PT    Recommendations for Other Services       Precautions / Restrictions Precautions Precautions: Fall Recall of Precautions/Restrictions: Impaired Precaution/Restrictions Comments: JP drain RLQ, monitor BP and HR Restrictions Weight Bearing Restrictions Per Provider  Order: No     Mobility  Bed Mobility Overal bed mobility: Needs Assistance Bed Mobility: Rolling Rolling: Mod assist, +2 for safety/equipment (line lead and tube management)   Supine to sit: HOB elevated, Used rails, Mod assist, +2 for safety/equipment (line lead and tube management) Sit to supine: +2 for physical assistance, Min assist, Mod assist, +2 for safety/equipment (line lead and tube management)   General bed mobility comments: requires increased time to complete with vc's for hand placement and sequencing    Transfers Overall transfer level: Needs assistance Equipment used: Rolling walker (2 wheels) Transfers: Sit to/from Stand Sit to Stand: Mod assist, +2 safety/equipment (line lead and tube management)           General transfer comment: 2x trials of STS with PT blocking BLE, from an elevated bed height towards RW for stability.    Ambulation/Gait               General Gait Details: deferred due to increased fatigue   Stairs             Wheelchair Mobility     Tilt Bed    Modified Rankin (Stroke Patients Only)       Balance Overall balance assessment: Needs assistance Sitting-balance support: Bilateral upper extremity supported, Feet supported Sitting balance-Leahy Scale: Good Sitting balance - Comments: steady static seated balance, able to reach within BOS. Was able to weightshift without PT support     Standing balance-Leahy Scale: Fair Standing balance comment: Steady static balance, requires BUE on RW or PT to steady once standing.  Communication Communication Communication: No apparent difficulties Factors Affecting Communication: Hearing impaired  Cognition Arousal: Alert Behavior During Therapy: WFL for tasks assessed/performed   PT - Cognitive impairments: No apparent impairments   Orientation impairments: Time, Person, Place                     Following commands:  Impaired Following commands impaired: Follows one step commands with increased time    Cueing Cueing Techniques: Verbal cues, Gestural cues, Tactile cues  Exercises      General Comments General comments (skin integrity, edema, etc.): Pre session: HR: 66 bpm, SpO2: 100% on room air, BP: 113/52 (72) supine, With sitting EOB: HR: 78 bpm, SpO2: 100% on room air, BP: 111/61 (75) seated, With standing trials: HR: 92 bpm, SpO2: 99+% on room air, Post session: HR: 68 bpm, SpO2: 100% on room air. RLQ JP drain: ~3/4 full, secure, intact and with small serous exudate at top of dressing, no change noted throughout session.      Pertinent Vitals/Pain Pain Assessment Pain Assessment: No/denies pain Pain Intervention(s): Limited activity within patient's tolerance, Monitored during session    Home Living                          Prior Function            PT Goals (current goals can now be found in the care plan section) Acute Rehab PT Goals Patient Stated Goal: to get better and stronger PT Goal Formulation: With patient/family Time For Goal Achievement: 03/22/24 Potential to Achieve Goals: Good Progress towards PT goals: Progressing toward goals    Frequency    Min 2X/week      PT Plan      Co-evaluation              AM-PAC PT 6 Clicks Mobility   Outcome Measure  Help needed turning from your back to your side while in a flat bed without using bedrails?: A Little Help needed moving from lying on your back to sitting on the side of a flat bed without using bedrails?: A Lot Help needed moving to and from a bed to a chair (including a wheelchair)?: A Lot Help needed standing up from a chair using your arms (e.g., wheelchair or bedside chair)?: A Lot Help needed to walk in hospital room?: A Lot Help needed climbing 3-5 steps with a railing? : A Lot 6 Click Score: 13    End of Session Equipment Utilized During Treatment: Gait belt Activity Tolerance: Patient  limited by fatigue Patient left: in bed;with call bell/phone within reach;with family/visitor present;with bed alarm set Nurse Communication: Mobility status;Other (comment) (Vitals, JP drain) PT Visit Diagnosis: Muscle weakness (generalized) (M62.81);Repeated falls (R29.6);Other abnormalities of gait and mobility (R26.89);Unsteadiness on feet (R26.81)     Time: 8870-8789 PT Time Calculation (min) (ACUTE ONLY): 41 min  Charges:                            Janaysha Depaulo, SPT 03/11/24, 12:35 PM

## 2024-03-11 NOTE — Assessment & Plan Note (Signed)
 PT/OT evaluation-recommending SNF

## 2024-03-11 NOTE — Progress Notes (Signed)
*  PRELIMINARY RESULTS* Echocardiogram 2D Echocardiogram has been performed.  [name] Young 03/11/2024, 9:45 AM

## 2024-03-11 NOTE — Plan of Care (Signed)

## 2024-03-11 NOTE — [place] FL2 (Signed)
 [place]  MEDICAID FL2 LEVEL OF CARE FORM     IDENTIFICATION  Patient Name: [name] Young. Birthdate: October 26, 1940 Sex: male Admission Date (Current Location): 03/06/2024  [facility] and [place] Number:  Chiropodist and Address:  [facility], [address], [place], [place] [address]      Provider Number: [id number]  Attending Physician Name and Address:  Caleen Qualia, MD  Relative Name and Phone Number:  [name] Goelz([phone]    Current Level of Care: Hospital Recommended Level of Care: Skilled Nursing Facility Prior Approval Number:    Date Approved/Denied:   PASRR Number: [id number]  Discharge Plan: SNF    Current Diagnoses: Patient Active Problem List   Diagnosis Date Noted   Malnutrition of moderate degree 03/07/2024   Pressure injury of skin 03/07/2024   Perihepatic abscess (HCC) 03/06/2024   Essential hypertension 03/06/2024   Hyperkalemia 03/06/2024   AKI (acute kidney injury) (HCC) 03/06/2024   Fall at home, initial encounter 02/11/2024   Abdominal right lower quadrant swelling 02/11/2024   Hemothorax on right 11/20/2023   Bradycardia 04/25/2023   General weakness 04/25/2023   Chronic heart failure with mildly reduced ejection fraction (HFmrEF, 41-49%) (HCC) 03/06/2023   Frequent PVCs 03/06/2023   Insomnia 03/05/2023   Decubitus ulcer of right buttock, stage 1 01/04/2023   Aortic valve stenosis 01/02/2023   History of endocarditis 12/28/2022   Hepatic abscess 12/28/2022   Protein-calorie malnutrition, severe (HCC) 12/28/2022   Abnormal TSH 12/27/2022   Recurrent pleural effusion on right 12/21/2022   Pre-op evaluation 05/03/2022   CCC (chronic calculous cholecystitis) 04/25/2022   History of acute cholangitis 04/25/2022   Palliative care by specialist    Bacteremia due to Streptococcus 10/24/2021   Vitamin B12 deficiency 05/24/2020   Vitamin D  deficiency 05/24/2020   Early dry stage nonexudative  age-related macular degeneration of both eyes 12/01/2019   Pain due to onychomycosis of toenails of both feet 07/28/2019   Diabetic neuropathy (HCC) 07/28/2019   Hearing loss, right 05/22/2019   PAF (paroxysmal atrial fibrillation) (HCC) 02/08/2018   FTT (failure to thrive) in adult 01/28/2018   Anemia    Constipation    S/P CABG x 3 01/18/2018   Coronary artery disease 12/29/2017   Chronic inactive rheumatic heart disease 11/24/2017   SOB (shortness of breath) 11/05/2017   DNR (do not resuscitate) 06/14/2017   Peripheral neuropathy 06/14/2017   Health maintenance examination 12/12/2016   Obesity, Class I, BMI 30-34.9 12/12/2016   Advanced care planning/counseling discussion 10/30/2014   Benign prostatic hyperplasia 10/30/2014   CKD stage 3 due to type 2 diabetes mellitus (HCC) 07/08/2013   Medicare annual wellness visit, subsequent 10/11/2011   Type 2 diabetes mellitus with other specified complication (HCC)    Hyperlipidemia associated with type 2 diabetes mellitus (HCC)    History of kidney stones    GERD (gastroesophageal reflux disease)     Orientation RESPIRATION BLADDER Height & Weight     Self, Time, Situation, Place  Normal Continent Weight: 216 lb 4.3 oz (98.1 kg) Height:  5' 10 (177.8 cm)  BEHAVIORAL SYMPTOMS/MOOD NEUROLOGICAL BOWEL NUTRITION STATUS   (None)  (None) Incontinent Diet (Heart healthy/carb modified)  AMBULATORY STATUS COMMUNICATION OF NEEDS Skin   Extensive Assist Verbally PU Stage and Appropriate Care, Other (Comment) (Incontinence-associated dermatitis on buttocks: No dressing listed.)   PU Stage 2 Dressing:  (Buttocks: Foam.)  Personal Care Assistance Level of Assistance  Bathing, Feeding, Dressing Bathing Assistance: Limited assistance Feeding assistance: Limited assistance Dressing Assistance: Limited assistance     Functional Limitations Info  Sight, Hearing, Speech Sight Info: Adequate Hearing Info: Adequate Speech  Info: Adequate    SPECIAL CARE FACTORS FREQUENCY  PT (By licensed PT), OT (By licensed OT)     PT Frequency: 5 x week OT Frequency: 5 x week            Contractures Contractures Info: Not present    Additional Factors Info  Code Status, Allergies, Isolation Precautions Code Status Info: DNR Allergies Info: NKDA     Isolation Precautions Info: Contact: VRE     Current Medications (03/11/2024):  This is the current hospital active medication list Current Facility-Administered Medications  Medication Dose Route Frequency Provider Last Rate Last Admin   apixaban  (ELIQUIS ) tablet 5 mg  5 mg Oral BID Amin, Sumayya, MD   5 mg at 03/11/24 1338   bisacodyl  (DULCOLAX) EC tablet 10 mg  10 mg Oral Daily PRN Cox, Amy N, DO       cyanocobalamin  (VITAMIN B12) tablet 1,000 mcg  1,000 mcg Oral Q M,W,F Cox, Amy N, DO   1,000 mcg at 03/10/24 0818   Fe Fum-Vit C-Vit B12-FA (TRIGELS-F FORTE) capsule 1 capsule  1 capsule Oral QPC breakfast Amin, Sumayya, MD   1 capsule at 03/11/24 0858   feeding supplement (ENSURE PLUS HIGH PROTEIN) liquid 237 mL  237 mL Oral TID BM Amin, Sumayya, MD   237 mL at 03/11/24 1400   melatonin tablet 5 mg  5 mg Oral QHS PRN Cox, Amy N, DO   5 mg at 03/10/24 2107   midodrine (PROAMATINE) tablet 10 mg  10 mg Oral TID WC Amin, Sumayya, MD   10 mg at 03/11/24 1338   mirtazapine  (REMERON ) tablet 30 mg  30 mg Oral QHS Cox, Amy N, DO   30 mg at 03/10/24 2107   multivitamin with minerals tablet 1 tablet  1 tablet Oral Daily Amin, Sumayya, MD   1 tablet at 03/11/24 9141   piperacillin -tazobactam (ZOSYN ) IVPB 3.375 g  3.375 g Intravenous Q8H Fitzgerald, David P, MD 12.5 mL/hr at 03/11/24 1339 3.375 g at 03/11/24 1339   polyethylene glycol (MIRALAX  / GLYCOLAX ) packet 17 g  17 g Oral Daily PRN Amin, Sumayya, MD       rosuvastatin  (CRESTOR ) tablet 10 mg  10 mg Oral QHS Cox, Amy N, DO   10 mg at 03/10/24 2107   sodium chloride  flush (NS) 0.9 % injection 5 mL  5 mL Intracatheter Q8H  Philip Cornet, MD   5 mL at 03/11/24 1434   tamsulosin  (FLOMAX ) capsule 0.4 mg  0.4 mg Oral QPC supper Amin, Sumayya, MD   0.4 mg at 03/10/24 1831     Discharge Medications: Please see discharge summary for a list of discharge medications.  Relevant Imaging Results:  Relevant Lab Results:   Additional Information SS#: [id number]. Per infectious disease pharmacist, will likely need 4-6 weeks of IV abx. Not sure yet which antibiotic it will be.  Lauraine JAYSON Carpen, LCSW

## 2024-03-11 NOTE — Progress Notes (Signed)
 INFECTIOUS DISEASE PROGRESS NOTE Date of Admission:  03/06/2024     ID: [name] Young. is a 83 y.o. male with  enterococcal bacteremia, liver abscess Principal Problem:   Perihepatic abscess (HCC) Active Problems:   Hyperlipidemia associated with type 2 diabetes mellitus (HCC)   Obesity, Class I, BMI 30-34.9   DNR (do not resuscitate)   S/P CABG x 3   FTT (failure to thrive) in adult   Anemia   PAF (paroxysmal atrial fibrillation) (HCC)   Protein-calorie malnutrition, severe (HCC)   Insomnia   General weakness   Essential hypertension   Hyperkalemia   AKI (acute kidney injury) (HCC)   Malnutrition of moderate degree   Pressure injury of skin   Subjective: No fevers, wbc 9. No abd pain. Changed to zosyn    ROS  Eleven systems are reviewed and negative except per hpi  Medications:  Antibiotics Given (last 72 hours)     Date/Time Action Medication Dose Rate   03/08/24 1818 New Bag/Given   ceFEPIme  (MAXIPIME ) 2 g in sodium chloride  0.9 % 100 mL IVPB 2 g 200 mL/hr   03/08/24 2018 New Bag/Given   metroNIDAZOLE  (FLAGYL ) IVPB 500 mg 500 mg 100 mL/hr   03/09/24 0538 New Bag/Given   ceFEPIme  (MAXIPIME ) 2 g in sodium chloride  0.9 % 100 mL IVPB 2 g 200 mL/hr   03/09/24 0801 New Bag/Given   metroNIDAZOLE  (FLAGYL ) IVPB 500 mg 500 mg 100 mL/hr   03/09/24 1413 New Bag/Given   DAPTOmycin (CUBICIN) 1,000 mg in sodium chloride  0.9 % IVPB 1,000 mg 140 mL/hr   03/09/24 1708 New Bag/Given   ceFEPIme  (MAXIPIME ) 2 g in sodium chloride  0.9 % 100 mL IVPB 2 g 200 mL/hr   03/09/24 2031 New Bag/Given   metroNIDAZOLE  (FLAGYL ) IVPB 500 mg 500 mg 100 mL/hr   03/10/24 0411 New Bag/Given   ceFEPIme  (MAXIPIME ) 2 g in sodium chloride  0.9 % 100 mL IVPB 2 g 200 mL/hr   03/10/24 0817 New Bag/Given   metroNIDAZOLE  (FLAGYL ) IVPB 500 mg 500 mg 100 mL/hr   03/10/24 1339 New Bag/Given   DAPTOmycin (CUBICIN) 1,000 mg in sodium chloride  0.9 % IVPB 1,000 mg 140 mL/hr   03/10/24 1831 New Bag/Given    piperacillin -tazobactam (ZOSYN ) IVPB 3.375 g 3.375 g 12.5 mL/hr   03/11/24 0510 New Bag/Given   piperacillin -tazobactam (ZOSYN ) IVPB 3.375 g 3.375 g 12.5 mL/hr       apixaban   5 mg Oral BID   cyanocobalamin   1,000 mcg Oral Q M,W,F   Fe Fum-Vit C-Vit B12-FA  1 capsule Oral QPC breakfast   feeding supplement  237 mL Oral TID BM   midodrine  10 mg Oral TID WC   mirtazapine   30 mg Oral QHS   multivitamin with minerals  1 tablet Oral Daily   rosuvastatin   10 mg Oral QHS   sodium chloride  flush  5 mL Intracatheter Q8H   tamsulosin   0.4 mg Oral QPC supper    Objective: Vital signs in last 24 hours: Temp:  [97.7 F (36.5 C)-98.2 F (36.8 C)] 97.7 F (36.5 C) (07/01 0312) Pulse Rate:  [65-75] 67 (07/01 0831) Resp:  [14-20] 14 (07/01 0831) BP: (102-113)/(42-61) 104/61 (07/01 0831) SpO2:  [96 %-100 %] 99 % (07/01 0831) Constitutional: He is frail, chronically ill appearin, nad HENT: anicter Mouth/Throat: Oropharynx is clear and moist. No oropharyngeal exudate.  Cardiovascular: Normal rate, regular rhythm and normal heart sounds. Exam reveals no gallop and no friction rub.  No murmur heard.  Pulmonary/Chest: Effort normal and breath sounds normal. No respiratory distress. He has no wheezes.  Abdominal: mild ttp RUQ, JP drain with bloody drainage Lymphadenopathy:  He has no cervical adenopathy.  Neurological: He is alert and oriented to person, place, and time.  Skin: Skin is warm and dry. No rash noted. No erythema.  Psychiatric: He has a normal mood and affect. His behavior is normal.  Lab Results Recent Labs    03/09/24 0818 03/11/24 0302  WBC 8.1 9.9  HGB 8.8* 9.0*  HCT 29.5* 30.7*  NA 140 139  K 5.1 4.8  CL 111 109  CO2 24 22  BUN 40* 29*  CREATININE 1.17 1.00    Microbiology: Results for orders placed or performed during the hospital encounter of 03/06/24  Blood Culture (routine x 2)     Status: Abnormal   Collection Time: 03/06/24 11:56 AM   Specimen: BLOOD LEFT  ARM  Result Value Ref Range Status   Specimen Description   Final    BLOOD LEFT ARM Performed at [facility], [address], [place], [place] [address]    Special Requests   Final    BOTTLES DRAWN AEROBIC AND ANAEROBIC Blood Culture adequate volume Performed at [facility], [address] [place], [place] [address]    Culture  Setup Time   Final    GRAM POSITIVE COCCI ANAEROBIC BOTTLE ONLY CRITICAL RESULT CALLED TO, READ BACK BY AND VERIFIED WITH: ALEX CHAPPELL AT 0802 03/08/24.PMF Performed at [facility], [address], [place], [place] [address]    Culture (A)  Final    ENTEROCOCCUS FAECALIS VANCOMYCIN  RESISTANT ENTEROCOCCUS ISOLATED    Report Status 03/10/2024 FINAL  Final   Organism ID, Bacteria ENTEROCOCCUS FAECALIS  Final      Susceptibility   Enterococcus faecalis - MIC*    AMPICILLIN  <=2 SENSITIVE Sensitive     VANCOMYCIN  >=32 RESISTANT Resistant     GENTAMICIN SYNERGY SENSITIVE Sensitive     * ENTEROCOCCUS FAECALIS  Blood Culture ID Panel (Reflexed)     Status: Abnormal   Collection Time: 03/06/24 11:56 AM  Result Value Ref Range Status   Enterococcus faecalis DETECTED (A) NOT DETECTED Final    Comment: CRITICAL RESULT CALLED TO, READ BACK BY AND VERIFIED WITH: ALEX CHAPPELL AT 0802 03/08/24.PMF    Enterococcus Faecium NOT DETECTED NOT DETECTED Final   Listeria monocytogenes NOT DETECTED NOT DETECTED Final   Staphylococcus species NOT DETECTED NOT DETECTED Final   Staphylococcus aureus (BCID) NOT DETECTED NOT DETECTED Final   Staphylococcus epidermidis NOT DETECTED NOT DETECTED Final   Staphylococcus lugdunensis NOT DETECTED NOT DETECTED Final   Streptococcus species NOT DETECTED NOT DETECTED Final   Streptococcus agalactiae NOT DETECTED NOT DETECTED Final   Streptococcus pneumoniae NOT DETECTED NOT DETECTED Final   Streptococcus pyogenes NOT DETECTED NOT DETECTED Final   A.calcoaceticus-baumannii NOT DETECTED NOT DETECTED Final    Bacteroides fragilis NOT DETECTED NOT DETECTED Final   Enterobacterales NOT DETECTED NOT DETECTED Final   Enterobacter cloacae complex NOT DETECTED NOT DETECTED Final   Escherichia coli NOT DETECTED NOT DETECTED Final   Klebsiella aerogenes NOT DETECTED NOT DETECTED Final   Klebsiella oxytoca NOT DETECTED NOT DETECTED Final   Klebsiella pneumoniae NOT DETECTED NOT DETECTED Final   Proteus species NOT DETECTED NOT DETECTED Final   Salmonella species NOT DETECTED NOT DETECTED Final   Serratia marcescens NOT DETECTED NOT DETECTED Final   Haemophilus influenzae NOT DETECTED NOT DETECTED Final   Neisseria  meningitidis NOT DETECTED NOT DETECTED Final   Pseudomonas aeruginosa NOT DETECTED NOT DETECTED Final   Stenotrophomonas maltophilia NOT DETECTED NOT DETECTED Final   Candida albicans NOT DETECTED NOT DETECTED Final   Candida auris NOT DETECTED NOT DETECTED Final   Candida glabrata NOT DETECTED NOT DETECTED Final   Candida krusei NOT DETECTED NOT DETECTED Final   Candida parapsilosis NOT DETECTED NOT DETECTED Final   Candida tropicalis NOT DETECTED NOT DETECTED Final   Cryptococcus neoformans/gattii NOT DETECTED NOT DETECTED Final   Vancomycin  resistance DETECTED (A) NOT DETECTED Final    Comment: CRITICAL RESULT CALLED TO, READ BACK BY AND VERIFIED WITH: ALEX CHAPPELL AT 0802 03/08/24.PMF Performed at [facility], [address] [place], [place] [address]   Blood Culture (routine x 2)     Status: None   Collection Time: 03/06/24  2:25 PM   Specimen: BLOOD  Result Value Ref Range Status   Specimen Description BLOOD BLOOD LEFT HAND [facility]  Final   Special Requests   Final    BOTTLES DRAWN AEROBIC AND ANAEROBIC Blood Culture adequate volume   Culture   Final    NO GROWTH 5 DAYS Performed at [facility], [address] [place], [place] [address]    Report Status 03/11/2024 FINAL  Final  Aerobic/Anaerobic Culture w Gram Stain (surgical/deep wound)     Status: None  (Preliminary result)   Collection Time: 03/07/24  4:00 PM   Specimen: Abscess  Result Value Ref Range Status   Specimen Description   Final    ABSCESS Performed at [facility], [address], [place], [place] [address]    Special Requests   Final    NONE Performed at [facility], [address] [place], [place] [address]    Gram Stain   Final    MODERATE WBC PRESENT, PREDOMINANTLY PMN MODERATE GRAM POSITIVE COCCI RARE GRAM NEGATIVE RODS Performed at [facility], [address], [place], [place] [address]    Culture   Final    ABUNDANT VANCOMYCIN  RESISTANT ENTEROCOCCUS ABUNDANT ESCHERICHIA COLI ABUNDANT PROTEUS MIRABILIS NO ANAEROBES ISOLATED; CULTURE IN PROGRESS FOR 5 DAYS    Report Status PENDING  Incomplete   Organism ID, Bacteria ESCHERICHIA COLI  Final   Organism ID, Bacteria PROTEUS MIRABILIS  Final   Organism ID, Bacteria VANCOMYCIN  RESISTANT ENTEROCOCCUS  Final      Susceptibility   Escherichia coli - MIC*    AMPICILLIN  <=2 SENSITIVE Sensitive     CEFEPIME  <=0.12 SENSITIVE Sensitive     CEFTAZIDIME  <=1 SENSITIVE Sensitive     CEFTRIAXONE  <=0.25 SENSITIVE Sensitive     CIPROFLOXACIN  <=0.25 SENSITIVE Sensitive     GENTAMICIN <=1 SENSITIVE Sensitive     IMIPENEM <=0.25 SENSITIVE Sensitive     TRIMETH/SULFA <=20 SENSITIVE Sensitive     AMPICILLIN /SULBACTAM <=2 SENSITIVE Sensitive     PIP/TAZO <=4 SENSITIVE Sensitive ug/mL    * ABUNDANT ESCHERICHIA COLI   Proteus mirabilis - MIC*    AMPICILLIN  <=2 SENSITIVE Sensitive     CEFEPIME  <=0.12 SENSITIVE Sensitive     CEFTAZIDIME  <=1 SENSITIVE Sensitive     CEFTRIAXONE  <=0.25 SENSITIVE Sensitive     CIPROFLOXACIN  <=0.25 SENSITIVE Sensitive     GENTAMICIN <=1 SENSITIVE Sensitive     IMIPENEM <=0.25 SENSITIVE Sensitive     TRIMETH/SULFA <=20 SENSITIVE Sensitive     AMPICILLIN /SULBACTAM <=2 SENSITIVE Sensitive     PIP/TAZO <=4 SENSITIVE Sensitive ug/mL    * ABUNDANT PROTEUS MIRABILIS  Vancomycin  resistant enterococcus - MIC*    AMPICILLIN  <=2 SENSITIVE Sensitive     VANCOMYCIN  >=32 RESISTANT Resistant     GENTAMICIN SYNERGY SENSITIVE Sensitive     * ABUNDANT VANCOMYCIN  RESISTANT ENTEROCOCCUS  Culture, blood (single) w Reflex to ID Panel     Status: None (Preliminary result)   Collection Time: 03/10/24  5:29 PM   Specimen: BLOOD  Result Value Ref Range Status   Specimen Description BLOOD BLOOD LEFT ARM BLA  Final   Special Requests   Final    BOTTLES DRAWN AEROBIC AND ANAEROBIC Blood Culture adequate volume   Culture   Final    NO GROWTH < 24 HOURS Performed at [facility], [address] [place], [place] [address]    Report Status PENDING  Incomplete    Studies/Results: No results found.  Assessment/Plan: Arvis Zwahlen. is a 83 y.o. male with a complicated medical history including A-fib, hypertension, chronic kidney disease, diabetes, prior history of streptococcal endocarditis, history of E. coli bacteremia, admitted with findings of perihepatic abscess on outpatient imaging.  He had a complicated course over the last few months and had recurrent pleural effusions requiring multiple thoracentesis complicated by right hemothorax and acute blood loss anemia in March.  He had been having some increasing abdominal pain at home and CT scan of his chest abdomen and pelvis was performed June 16.  This showed removal of right-sided pleural drain decreased size of loculated right pleural effusion decreased size of right pleural mass but a new subcapsular fluid collection adjacent to the inferior right hepatic lobe measuring up to 7.5 cm.  There is also intramuscular wall thickening along the right 11th and 12th ribs.  Patient underwent IR CT-guided aspiration of the right lateral abdominal wall right perihepatic fluid collection with 450 mL of bloody purulent fluid removed.  Gram stain showed moderate gram-positive cocci and rare gram-negative rods. Since  admission his blood cultures have turned positive for Enterococcus which is vancomycin  resistant but sensitive to ampicillin .  No follow-up blood cultures have been done. Since admission he was on cefepime  and metronidazole  and daptomycin was started June 28. Drain cx pending.     7/1- improving. Changed ot zosyn . FU BCX 6/30 ngtd.   Recommendations Enterococcal bacteremia- amp S. Source liver abscess. Repeat Bcx 6/30 ngtd Echo pending - likely needs TEE given prior endocarditis history and high chance of endocarditis.    Liver abscess- s/p drain. GS mixed enterococcus, E coli and  and proteus  Consolidate to unasyn   Thank you very much for the consult. Will follow with you.  Alm SHAUNNA Needle   03/11/2024, 1:09 PM

## 2024-03-11 NOTE — Progress Notes (Signed)
 Progress Note   Patient: [name] Young. FMW:[id number] DOB: 15-Jul-1941 DOA: 03/06/2024     5 DOS: the patient was seen and examined on 03/11/2024   Brief hospital course: Mr. Hosam Mcfetridge is a 83 year old male with history of atrial fibrillation on Eliquis , hypertension, CKD 3a, non-insulin -dependent diabetes mellitus, history of endocarditis with Streptococcus bacteremia, history of E. coli bacteremia, who presents ED for chief concerns of perihepatic abscess seen on outpatient imaging.  Patient was having right flank and upper back pain for several weeks now.  Vitals in the ED showed t of 98.5, respiration rate of 23, heart rate 74, blood pressure 101/47, SpO2 100% on room air.  Serum sodium is 136, potassium 5.4, chloride 100, bicarb 24, BUN of 63, serum creatinine 1.82, EGFR 37, nonfasting blood glucose 176, WBC 12.6, hemoglobin 8.3, platelets of 432. Initial lactic acid was 2.6 and on repeat is 2.9.  Blood cultures were drawn.  UA was not consistent with UTI.  ED treatment: Cefepime  2 g IV, Flagyl  500 mg IV one-time doses, sodium chloride  500 mg bolus x 2.  IR was consulted to see the possibility of drain placement.  6/27: Blood pressure is soft at 87/45 -500 cc bolus ordered. Preliminary blood cultures negative in 24-hour.  Leukocytosis resolved, decrease of hemoglobin to 7.1 this morning-all cell lines decreased.  Hyperkalemia with potassium at 5.5.  Received management per hyperkalemia protocol.  6/28: Blood pressure is soft at 85/51.  Hemoglobin at 7.4 after improving to 7.9 s/p 1 unit of PRBC.  Active bleeding from drain, discussed with IR and they were recommending doing a CTA of her abdomen which was ordered-ordered 2 more unit of PRBC Significant hypoalbuminemia with albumin  at 1.6, slowly improving renal function with creatinine at 1.41, potassium at 5-continuing Lokelma now.  Preliminary blood cultures positive for Enterococcus faecalis and abscess cultures growing  gram-positive cocci and gram-negative rods-ID was consulted and daptomycin was added to cefepime  and Flagyl .  6/29: Hemodynamically stable, hemoglobin stable now at 8.8, leukocytosis has been resolved.  Renal function improved 1.17.  CTA abdomen yesterday with no active hemorrhage.  Did show extensive collection of fluid at right lung base extending up to [place]ox base and right abdominal wall-some improvement since this admission after the drain.  Still awaiting cultures from drain.  6/30: Hemodynamically stable, continue to have bloody serous discharge in drain.  No pain today. Repeat blood cultures were ordered.  7/1: Vitals and labs stable.  Repeat blood culture Echocardiogram with normal EF, grade 1 diastolic dysfunction, mild to moderate AAS.  Cardiology was consulted forTEE to completely rule out endocarditis due to his prior history.  Antibiotics switched with Zosyn  based on sensitivity results by ID.  Assessment and Plan: * Perihepatic abscess (HCC) Enterococcus faecalis bacteremia S/p drain placement by IR, mixture of blood and purulent material drained. Had frank bleeding on 6/28 morning which has been improved now.  Hemoglobin now at 8.8 s/p 2 unit of PRBC CTA abdomen with no active hemorrhage and significant amount of fluid collection with some improvement as compared to prior CT. Blood and abscess culture with Enterococcus faecalis resistant to vancomycin  Repeat blood cultures ordered Antibiotics consolidated to Zosyn  by ID Echocardiogram with no obvious vegetation-ordered TEE as advised by ID because of his history of recent endocarditis - Follow-up culture results   AKI (acute kidney injury) (HCC) Improved AKI suspect secondary to poor p.o. intake in setting of right upper quadrant abdominal pain. Improving creatinine with IV fluid, currently at 1.00 baseline  appears to be around 1.1-1.3. -Avoid hypotension-midodrine dose was increased - Monitor renal function - Keep  holding torsemide    Hyperkalemia Resolved with potassium of 4.8 today - Continue to monitor - Holding spironolactone    PAF (paroxysmal atrial fibrillation) (HCC) Heart rate well-controlled - Holding metoprolol  due to softer blood pressure  Anemia Hemoglobin 7.1>>7.9>>7.4>>8.8 >>9.0  S/p 2 unit of PRBC Anemia panel consistent with anemia of chronic disease,  -Monitor hemoglobin -Transfuse below 7  S/P CABG x 3 Home rosuvastatin  10 milligrams nightly resumed, metoprolol  tartrate 12.5 mg p.o. twice daily will resume Home aspirin  not resumed on admission in setting of anticipating IR procedure   Essential hypertension Blood pressure within lower goal today. Holding metoprolol  12.5 mg p.o. twice daily  Keep holding home lisinopril  and spironolactone - Continue with midodrine 10 mg 3 times daily -Avoid hypotension    Hyperlipidemia associated with type 2 diabetes mellitus (HCC) Rosuvastatin  10 mg daily  Protein-calorie malnutrition, severe (HCC) Registered dietitian has been consulted  General weakness PT/OT evaluation-recommending SNF  Insomnia Melatonin nightly as needed for sleep      Subjective: Patient was seen and examined today.  No new concern.  Physical Exam: Vitals:   03/10/24 2007 03/11/24 0312 03/11/24 0831 03/11/24 1521  BP: (!) 111/43 (!) 113/42 104/61   Pulse: 75 65 67 70  Resp: 20 20 14    Temp: 98.1 F (36.7 C) 97.7 F (36.5 C)    TempSrc: Oral Oral    SpO2: 96% 98% 99% 100%  Weight:      Height:       General.  Frail elderly man, in no acute distress. Pulmonary.  Lungs clear bilaterally, normal respiratory effort. CV.  Regular rate and rhythm, no JVD, rub or murmur. Abdomen.  Soft, nontender, nondistended, BS positive. CNS.  Alert and oriented .  No focal neurologic deficit. Extremities.  No edema, no cyanosis, pulses intact and symmetrical. Psychiatry.  Judgment and insight appears normal.    Data Reviewed: Prior data  reviewed  Family Communication: Discussed with wife at bedside  Disposition: Status is: Inpatient Remains inpatient appropriate because: Severity of illness  Planned Discharge Destination: SNF  Time spent: 50 minutes  This record has been created using Conservation officer, historic buildings. Errors have been sought and corrected,but may not always be located. Such creation errors do not reflect on the standard of care.   Author: Amaryllis Dare, MD 03/11/2024 3:48 PM  For on call review [email or web address].

## 2024-03-12 ENCOUNTER — Inpatient Hospital Stay: Admitting: Certified Registered Nurse Anesthetist

## 2024-03-12 ENCOUNTER — Inpatient Hospital Stay (HOSPITAL_COMMUNITY)
Admit: 2024-03-12 | Discharge: 2024-03-12 | Disposition: A | Discharge status: Home / Self Care | Attending: Physician Assistant | Admitting: Physician Assistant

## 2024-03-12 ENCOUNTER — Encounter
Admission: EM | Disposition: A | Discharge status: Skilled Nursing Facility | Payer: Self-pay | Source: Home / Self Care | Attending: Internal Medicine

## 2024-03-12 DIAGNOSIS — R7881 Bacteremia: Secondary | ICD-10-CM

## 2024-03-12 DIAGNOSIS — I340 Nonrheumatic mitral (valve) insufficiency: Secondary | ICD-10-CM | POA: Diagnosis not present

## 2024-03-12 DIAGNOSIS — I361 Nonrheumatic tricuspid (valve) insufficiency: Secondary | ICD-10-CM

## 2024-03-12 DIAGNOSIS — I351 Nonrheumatic aortic (valve) insufficiency: Secondary | ICD-10-CM | POA: Diagnosis not present

## 2024-03-12 DIAGNOSIS — K650 Generalized (acute) peritonitis: Secondary | ICD-10-CM | POA: Diagnosis not present

## 2024-03-12 HISTORY — DX: Bacteremia: R78.81

## 2024-03-12 HISTORY — PX: TEE WITHOUT CARDIOVERSION: SHX5443

## 2024-03-12 LAB — GLUCOSE, CAPILLARY: Glucose-Capillary: 155 mg/dL — ABNORMAL HIGH (ref 70–99)

## 2024-03-12 LAB — AEROBIC/ANAEROBIC CULTURE W GRAM STAIN (SURGICAL/DEEP WOUND)

## 2024-03-12 LAB — ECHO TEE

## 2024-03-12 SURGERY — ECHOCARDIOGRAM, TRANSESOPHAGEAL
Anesthesia: General

## 2024-03-12 MED ORDER — VITAMIN C 500 MG PO TABS
500.0000 mg | ORAL_TABLET | Freq: Two times a day (BID) | ORAL | Status: DC
  Administered 2024-03-12 – 2024-03-17 (×10): 500 mg via ORAL
  Filled 2024-03-12 (×10): qty 1

## 2024-03-12 MED ORDER — PHENYLEPHRINE 80 MCG/ML (10ML) SYRINGE FOR IV PUSH (FOR BLOOD PRESSURE SUPPORT)
PREFILLED_SYRINGE | INTRAVENOUS | Status: DC | PRN
  Administered 2024-03-12: 80 ug via INTRAVENOUS

## 2024-03-12 MED ORDER — LIDOCAINE VISCOUS HCL 2 % MT SOLN
15.0000 mL | Freq: Once | OROMUCOSAL | Status: AC
  Administered 2024-03-12: 15 mL via OROMUCOSAL
  Filled 2024-03-12: qty 15

## 2024-03-12 MED ORDER — PROPOFOL 10 MG/ML IV BOLUS
INTRAVENOUS | Status: DC | PRN
  Administered 2024-03-12 (×2): 20 mg via INTRAVENOUS
  Administered 2024-03-12: 30 mg via INTRAVENOUS

## 2024-03-12 MED ORDER — LIDOCAINE VISCOUS HCL 2 % MT SOLN
OROMUCOSAL | Status: AC
Start: 2024-03-12 — End: 2024-03-12
  Filled 2024-03-12: qty 15

## 2024-03-12 MED ORDER — PHENYLEPHRINE HCL (PRESSORS) 10 MG/ML IV SOLN
INTRAVENOUS | Status: DC | PRN
  Administered 2024-03-12 (×2): 80 ug via INTRAVENOUS

## 2024-03-12 MED ORDER — ORAL CARE MOUTH RINSE
15.0000 mL | OROMUCOSAL | Status: DC | PRN
Start: 2024-03-12 — End: 2024-03-17

## 2024-03-12 MED ORDER — BUTAMBEN-TETRACAINE-BENZOCAINE 2-2-14 % EX AERO
INHALATION_SPRAY | CUTANEOUS | Status: AC
  Filled 2024-03-12: qty 5

## 2024-03-12 NOTE — Progress Notes (Signed)
 Physical Therapy Treatment Patient Details Name: [name] Young. MRN: [id number] DOB: 01-28-1941 Today's Date: 03/12/2024   History of Present Illness Pt is a 83 y.o. male admitted with perihepatic abscess, s/p CT/US  guided drainage with RUQ drain placement on 6/27, AKI, & protein-calorie malnutrition. PMH significant for CABGx3, CHF, DOE, AFIB on Eliquis , HTN, CKD 3a, DM, endocarditis with Streptococcus bacteremia, & E. coli bacteremia    PT Comments  PT initiated treatment, pt agreed to some exercises and EOB activity.  However partway through the session he needed to urgently have a BM, could not hold while PT offered to help with bed pan.  Ultimately only managed a few rounds of supine exercises (pt displayed significant weakness with these) and pt declined further PT.  Pt left in bed awaiting nursing to assist with clean up.  Pt will need further PT, continue with POC.      If plan is discharge home, recommend the following: A lot of help with walking and/or transfers;A lot of help with bathing/dressing/bathroom;Assistance with cooking/housework;Assist for transportation;Help with stairs or ramp for entrance;Supervision due to cognitive status   Can travel by private vehicle     No  Equipment Recommendations  None recommended by PT    Recommendations for Other Services       Precautions / Restrictions Precautions Precautions: Fall Recall of Precautions/Restrictions: Impaired Restrictions Weight Bearing Restrictions Per Provider Order: No     Mobility  Bed Mobility               General bed mobility comments: Pt initially willing to try getting/sitting/standing up after supine exercises, however had a BM and deferred mobility needing a clean up    Transfers                        Ambulation/Gait                   Stairs             Wheelchair Mobility     Tilt Bed    Modified Rankin (Stroke Patients Only)       Balance                                             Communication Communication Communication: Impaired;No apparent difficulties  Cognition Arousal: Alert Behavior During Therapy: WFL for tasks assessed/performed   PT - Cognitive impairments: No apparent impairments                         Following commands: Impaired      Cueing    Exercises General Exercises - Lower Extremity Ankle Circles/Pumps: AROM, 10 reps Heel Slides: AROM, Strengthening, 10 reps (with resisted LE ext) Hip ABduction/ADduction: AROM, 10 reps    General Comments        Pertinent Vitals/Pain Pain Assessment Faces Pain Scale: Hurts a little bit Pain Location: general/abdominal pain    Home Living                          Prior Function            PT Goals (current goals can now be found in the care plan section) Progress towards PT goals:  (slow progress)    Frequency  Min 2X/week      PT Plan      Co-evaluation              AM-PAC PT 6 Clicks Mobility   Outcome Measure  Help needed turning from your back to your side while in a flat bed without using bedrails?: A Little Help needed moving from lying on your back to sitting on the side of a flat bed without using bedrails?: A Lot Help needed moving to and from a bed to a chair (including a wheelchair)?: A Lot Help needed standing up from a chair using your arms (e.g., wheelchair or bedside chair)?: A Lot Help needed to walk in hospital room?: A Lot Help needed climbing 3-5 steps with a railing? : A Lot 6 Click Score: 13    End of Session   Activity Tolerance: Patient limited by fatigue;Other (comment) Patient left: in bed;with call bell/phone within reach;with family/visitor present;with bed alarm set Nurse Communication: Mobility status PT Visit Diagnosis: Muscle weakness (generalized) (M62.81);Repeated falls (R29.6);Other abnormalities of gait and mobility (R26.89);Unsteadiness on feet  (R26.81)     Time: 8475-8459 PT Time Calculation (min) (ACUTE ONLY): 16 min  Charges:    $Therapeutic Exercise: 8-22 mins PT General Charges $$ ACUTE PT VISIT: 1 Visit                     Carmin JONELLE Deed, DPT 03/12/2024, 3:46 PM

## 2024-03-12 NOTE — Transfer of Care (Signed)
 Immediate Anesthesia Transfer of Care Note  Patient: [name] Young.  Procedure(s) Performed: ECHOCARDIOGRAM, TRANSESOPHAGEAL  Patient Location: PACU and Cath Lab  Anesthesia Type:General  Level of Consciousness: awake, drowsy, and patient cooperative  Airway & Oxygen Therapy: Patient Spontanous Breathing and Patient connected to nasal cannula oxygen  Post-op Assessment: Report given to RN and Post -op Vital signs reviewed and stable  Post vital signs: Reviewed and stable  Last Vitals:  Vitals Value Taken Time  BP 95/50 03/12/24 12:45  Temp 36.6 C 03/12/24 12:45  Pulse 73 03/12/24 12:45  Resp 23 03/12/24 12:45  SpO2 99 % 03/12/24 12:45    Last Pain:  Vitals:   03/12/24 1245  TempSrc: Temporal  PainSc: 0-No pain         Complications: No notable events documented.

## 2024-03-12 NOTE — Progress Notes (Addendum)
 Progress Note    [name] Young.  FMW:[id number] DOB: Aug 01, 1941  DOA: 03/06/2024 PCP: Rilla Baller, MD      Brief Narrative:    Medical records reviewed and are as summarized below:   [name] is a 83 year old male with history of atrial fibrillation on Eliquis , hypertension, CKD 3a, non-insulin -dependent diabetes mellitus, history of endocarditis with Streptococcus bacteremia, history of E. coli bacteremia, who presented to the ED for chief concerns of perihepatic abscess seen on outpatient imaging.  He had been experiencing right flank and upper back pain for several weeks.  Vitals in the ED showed t of 98.5, respiration rate of 23, heart rate 74, blood pressure 101/47, SpO2 100% on room air.  Serum sodium is 136, potassium 5.4, chloride 100, bicarb 24, BUN of 63, serum creatinine 1.82, EGFR 37, nonfasting blood glucose 176, WBC 12.6, hemoglobin 8.3, platelets of 432. Initial lactic acid was 2.6 and on repeat is 2.9.  Blood cultures were drawn.  UA was not consistent with UTI.  ED treatment: Cefepime  2 g IV, Flagyl  500 mg IV one-time doses, sodium chloride  500 mg bolus x 2.   He was found to have AKI, perihepatic abscess and Enterococcus faecalis bacteremia.  He was treated with empiric IV antibiotics.  He developed hypotension and acute blood loss anemia.  He was treated with IV fluids and was transfused with 3 units of packed red blood cells.      Assessment/Plan:   Principal Problem:   Perihepatic abscess (HCC) Active Problems:   AKI (acute kidney injury) (HCC)   Hyperkalemia   Anemia   PAF (paroxysmal atrial fibrillation) (HCC)   S/P CABG x 3   Essential hypertension   Hyperlipidemia associated with type 2 diabetes mellitus (HCC)   FTT (failure to thrive) in adult   General weakness   Obesity, Class I, BMI 30-34.9   DNR (do not resuscitate)   Insomnia   Malnutrition of moderate degree   Pressure injury of skin    Bacteremia   Nutrition Problem: Moderate Malnutrition Etiology: chronic illness (CHF)  Signs/Symptoms: mild fat depletion, moderate fat depletion, mild muscle depletion, moderate muscle depletion, energy intake < 75% for > or equal to 1 month   Body mass index is 31.03 kg/m.  (Obesity)  Perihepatic abscess, Enterococcus faecalis bacteremia: S/p image guided drainage of abdominal wall abscess and hematoma on 03/07/2024.  Repeat CT abdomen shows improvement in fluid collection.  TEE on 03/12/2024 did not show any evidence of vegetations or endocarditis. Antibiotics changed from Zosyn  to Unasyn .  Continue IV Unasyn .  Follow-up with ID for further recommendations. IR will reevaluate abdominal drain in 1 to 2 weeks   AKI: Improved.  Torsemide  on hold. Hyperkalemia: Improved.  Spironolactone on hold.  Hypotension: Improved.  Continue midodrine   Acute blood loss anemia: Hemoglobin improved.  S/p transfusion with 3 units of PRBCs.   Paroxysmal atrial fibrillation, CAD s/p CABG x 3: continue Eliquis    General Weakness: PT and OT recommending discharge to SNF.   Comorbidities include moderate malnutrition related to chronic illness, hyperlipidemia, type 2 diabetes mellitus, hypertension    Diet Order             Diet NPO time specified  Diet effective now                            Consultants:  ID specialist Intervention radiologist  Procedures: CT and ultrasound-guided drainage  of abdominal wall abscess/infected hematoma on 03/07/2024 TEE on 03/12/2024    Medications:    [MAR Hold] apixaban   5 mg Oral BID   [MAR Hold] cyanocobalamin   1,000 mcg Oral Q M,W,F   [MAR Hold] Fe Fum-Vit C-Vit B12-FA  1 capsule Oral QPC breakfast   [MAR Hold] feeding supplement  237 mL Oral TID BM   [MAR Hold] midodrine  10 mg Oral TID WC   [MAR Hold] mirtazapine   30 mg Oral QHS   [MAR Hold] multivitamin with minerals  1 tablet Oral Daily   [MAR Hold] rosuvastatin   10 mg Oral  QHS   [MAR Hold] sodium chloride  flush  5 mL Intracatheter Q8H   [MAR Hold] tamsulosin   0.4 mg Oral QPC supper   Continuous Infusions:  sodium chloride  20 mL/hr at 03/11/24 2007   [MAR Hold] ampicillin -sulbactam (UNASYN ) IV 3 g (03/12/24 0624)     Anti-infectives (From admission, onward)    Start     Dose/Rate Route Frequency Ordered Stop   03/11/24 2200  [MAR Hold]  Ampicillin -Sulbactam (UNASYN ) 3 g in sodium chloride  0.9 % 100 mL IVPB        (MAR Hold since Wed 03/12/2024 at 1151.Hold Reason: Transfer to a Procedural area)   3 g 200 mL/hr over 30 Minutes Intravenous Every 6 hours 03/11/24 1619     03/10/24 1800  piperacillin -tazobactam (ZOSYN ) IVPB 3.375 g        3.375 g 12.5 mL/hr over 240 Minutes Intravenous Every 8 hours 03/10/24 1649 03/11/24 1739   03/08/24 1100  DAPTOmycin (CUBICIN) 1,000 mg in sodium chloride  0.9 % IVPB  Status:  Discontinued        1,000 mg 140 mL/hr over 30 Minutes Intravenous Daily 03/08/24 0825 03/10/24 1649   03/07/24 0100  ceFEPIme  (MAXIPIME ) 2 g in sodium chloride  0.9 % 100 mL IVPB  Status:  Discontinued        2 g 200 mL/hr over 30 Minutes Intravenous Every 12 hours 03/06/24 1458 03/10/24 1649   03/06/24 2000  metroNIDAZOLE  (FLAGYL ) IVPB 500 mg  Status:  Discontinued        500 mg 100 mL/hr over 60 Minutes Intravenous Every 12 hours 03/06/24 1441 03/10/24 1649   03/06/24 1300  ceFEPIme  (MAXIPIME ) 2 g in sodium chloride  0.9 % 100 mL IVPB        2 g 200 mL/hr over 30 Minutes Intravenous  Once 03/06/24 1245 03/06/24 1410   03/06/24 1300  metroNIDAZOLE  (FLAGYL ) IVPB 500 mg        500 mg 100 mL/hr over 60 Minutes Intravenous  Once 03/06/24 1245 03/06/24 1622              Family Communication/Anticipated D/C date and plan/Code Status   DVT prophylaxis: Place TED hose Start: 03/06/24 1440 apixaban  (ELIQUIS ) tablet 5 mg     Code Status: Full Code  Family Communication: Plan discussed with his wife at the bedside Disposition Plan: Plan to  discharge home   Status is: Inpatient Remains inpatient appropriate because: Perihepatic abscess and bacteremia       Subjective:   Interval events noted.  He complains of pain on his side where the abdominal drain is located.  No other complaints.  His wife was at the bedside..  Objective:    Vitals:   03/12/24 1242 03/12/24 1243 03/12/24 1245 03/12/24 1300  BP:  (!) 92/49 (!) 95/50 (!) 99/43  Pulse: (!) 57 71 73 68  Resp: (!) 23 (!) 29 (!) 23  20  Temp:   97.9 F (36.6 C)   TempSrc:   Temporal   SpO2: 100% 99% 99% 97%  Weight:      Height:       No data found.   Intake/Output Summary (Last 24 hours) at 03/12/2024 1328 Last data filed at 03/12/2024 1232 Gross per 24 hour  Intake 0 ml  Output 470 ml  Net -470 ml   Filed Weights   03/06/24 1115 03/07/24 1445  Weight: 98.1 kg 98.1 kg    Exam:  GEN: NAD SKIN: Warm and dry EYES: No pallor or icterus ENT: MMM CV: RRR PULM: CTA B ABD: soft, obese, NT, +BS, +Abdominal drain with bloody fluid CNS: AAO x 3, non focal EXT: No edema or tenderness        Data Reviewed:   I have personally reviewed following labs and imaging studies:  Labs: Labs show the following:   Basic Metabolic Panel: Recent Labs  Lab 03/07/24 0508 03/07/24 0921 03/07/24 1156 03/08/24 0455 03/09/24 0818 03/11/24 0302  NA 138 138  --  140 140 139  K 5.5* 5.0   < > 5.0 5.1 4.8  CL 107 108  --  109 111 109  CO2 24 24  --  24 24 22   GLUCOSE 138* 128*  --  112* 106* 111*  BUN 58* 59*  --  54* 40* 29*  CREATININE 1.62* 1.54*  --  1.41* 1.17 1.00  CALCIUM  7.9* 7.7*  --  8.0* 8.1* 8.4*   < > = values in this interval not displayed.   GFR Estimated Creatinine Clearance: 68 mL/min (by C-G formula based on SCr of 1 mg/dL). Liver Function Tests: Recent Labs  Lab 03/06/24 1122 03/08/24 0455  AST 42* 18  ALT 35 20  ALKPHOS 109 76  BILITOT 0.6 0.7  PROT 7.8 5.7*  ALBUMIN  2.2* 1.6*   No results for input(s): LIPASE,  AMYLASE in the last 168 hours. No results for input(s): AMMONIA in the last 168 hours. Coagulation profile Recent Labs  Lab 03/07/24 0508  INR 1.4*    CBC: Recent Labs  Lab 03/06/24 1122 03/07/24 0508 03/07/24 1702 03/08/24 0455 03/08/24 1659 03/09/24 0818 03/11/24 0302  WBC 12.6* 10.4  --  7.3  --  8.1 9.9  NEUTROABS 9.8*  --   --   --   --   --   --   HGB 8.3* 7.1* 7.9* 7.4* 8.7* 8.8* 9.0*  HCT 28.2* 24.0* 26.3* 24.9* 28.7* 29.5* 30.7*  MCV 83.4 82.2  --  81.6  --  81.9 83.4  PLT 432* 326  --  309  --  329 323   Cardiac Enzymes: Recent Labs  Lab 03/08/24 0455  CKTOTAL 19*   BNP (last 3 results) Recent Labs    04/25/23 1516  PROBNP 142.0*   CBG: No results for input(s): GLUCAP in the last 168 hours. D-Dimer: No results for input(s): DDIMER in the last 72 hours. Hgb A1c: No results for input(s): HGBA1C in the last 72 hours. Lipid Profile: No results for input(s): CHOL, HDL, LDLCALC, TRIG, CHOLHDL, LDLDIRECT in the last 72 hours. Thyroid  function studies: No results for input(s): TSH, T4TOTAL, T3FREE, THYROIDAB in the last 72 hours.  Invalid input(s): FREET3 Anemia work up: No results for input(s): VITAMINB12, FOLATE, FERRITIN, TIBC, IRON, RETICCTPCT in the last 72 hours. Sepsis Labs: Recent Labs  Lab 03/06/24 1156 03/06/24 1425 03/06/24 1739 03/07/24 0508 03/08/24 0455 03/09/24 0818 03/11/24 0302  WBC  --   --   --  10.4 7.3 8.1 9.9  LATICACIDVEN 2.6* 2.9* 1.5  --   --   --   --     Microbiology Recent Results (from the past 240 hours)  Blood Culture (routine x 2)     Status: Abnormal   Collection Time: 03/06/24 11:56 AM   Specimen: BLOOD LEFT ARM  Result Value Ref Range Status   Specimen Description   Final    BLOOD LEFT ARM Performed at [facility], [address], [place], [place] [address]    Special Requests   Final    BOTTLES DRAWN AEROBIC AND ANAEROBIC Blood Culture adequate  volume Performed at [facility], [address] [place], [place] [address]    Culture  Setup Time   Final    GRAM POSITIVE COCCI ANAEROBIC BOTTLE ONLY CRITICAL RESULT CALLED TO, READ BACK BY AND VERIFIED WITH: ALEX CHAPPELL AT 0802 03/08/24.PMF Performed at [facility], [address] [place], [place] [address]    Culture (A)  Final    ENTEROCOCCUS FAECALIS VANCOMYCIN  RESISTANT ENTEROCOCCUS ISOLATED    Report Status 03/10/2024 FINAL  Final   Organism ID, Bacteria ENTEROCOCCUS FAECALIS  Final      Susceptibility   Enterococcus faecalis - MIC*    AMPICILLIN  <=2 SENSITIVE Sensitive     VANCOMYCIN  >=32 RESISTANT Resistant     GENTAMICIN SYNERGY SENSITIVE Sensitive     * ENTEROCOCCUS FAECALIS  Blood Culture ID Panel (Reflexed)     Status: Abnormal   Collection Time: 03/06/24 11:56 AM  Result Value Ref Range Status   Enterococcus faecalis DETECTED (A) NOT DETECTED Final    Comment: CRITICAL RESULT CALLED TO, READ BACK BY AND VERIFIED WITH: ALEX CHAPPELL AT 0802 03/08/24.PMF    Enterococcus Faecium NOT DETECTED NOT DETECTED Final   Listeria monocytogenes NOT DETECTED NOT DETECTED Final   Staphylococcus species NOT DETECTED NOT DETECTED Final   Staphylococcus aureus (BCID) NOT DETECTED NOT DETECTED Final   Staphylococcus epidermidis NOT DETECTED NOT DETECTED Final   Staphylococcus lugdunensis NOT DETECTED NOT DETECTED Final   Streptococcus species NOT DETECTED NOT DETECTED Final   Streptococcus agalactiae NOT DETECTED NOT DETECTED Final   Streptococcus pneumoniae NOT DETECTED NOT DETECTED Final   Streptococcus pyogenes NOT DETECTED NOT DETECTED Final   A.calcoaceticus-baumannii NOT DETECTED NOT DETECTED Final   Bacteroides fragilis NOT DETECTED NOT DETECTED Final   Enterobacterales NOT DETECTED NOT DETECTED Final   Enterobacter cloacae complex NOT DETECTED NOT DETECTED Final   Escherichia coli NOT DETECTED NOT DETECTED Final   Klebsiella aerogenes NOT DETECTED NOT  DETECTED Final   Klebsiella oxytoca NOT DETECTED NOT DETECTED Final   Klebsiella pneumoniae NOT DETECTED NOT DETECTED Final   Proteus species NOT DETECTED NOT DETECTED Final   Salmonella species NOT DETECTED NOT DETECTED Final   Serratia marcescens NOT DETECTED NOT DETECTED Final   Haemophilus influenzae NOT DETECTED NOT DETECTED Final   Neisseria meningitidis NOT DETECTED NOT DETECTED Final   Pseudomonas aeruginosa NOT DETECTED NOT DETECTED Final   Stenotrophomonas maltophilia NOT DETECTED NOT DETECTED Final   Candida albicans NOT DETECTED NOT DETECTED Final   Candida auris NOT DETECTED NOT DETECTED Final   Candida glabrata NOT DETECTED NOT DETECTED Final   Candida krusei NOT DETECTED NOT DETECTED Final   Candida parapsilosis NOT DETECTED NOT DETECTED Final   Candida tropicalis NOT DETECTED NOT DETECTED Final   Cryptococcus neoformans/gattii NOT DETECTED NOT DETECTED Final   Vancomycin  resistance DETECTED (A) NOT DETECTED Final    Comment:  CRITICAL RESULT CALLED TO, READ BACK BY AND VERIFIED WITH: ALEX CHAPPELL AT 0802 03/08/24.PMF Performed at [facility], [address] [place], [place] [address]   Blood Culture (routine x 2)     Status: None   Collection Time: 03/06/24  2:25 PM   Specimen: BLOOD  Result Value Ref Range Status   Specimen Description BLOOD BLOOD LEFT HAND [facility]  Final   Special Requests   Final    BOTTLES DRAWN AEROBIC AND ANAEROBIC Blood Culture adequate volume   Culture   Final    NO GROWTH 5 DAYS Performed at [facility], [address], [place], [place] [address]    Report Status 03/11/2024 FINAL  Final  Aerobic/Anaerobic Culture w Gram Stain (surgical/deep wound)     Status: None   Collection Time: 03/07/24  4:00 PM   Specimen: Abscess  Result Value Ref Range Status   Specimen Description   Final    ABSCESS Performed at [facility], [address], [place], [place] [address]    Special Requests   Final     NONE Performed at [facility], [address] [place], [place] [address]    Gram Stain   Final    MODERATE WBC PRESENT, PREDOMINANTLY PMN MODERATE GRAM POSITIVE COCCI RARE GRAM NEGATIVE RODS Performed at [facility], [address], [place], [place] [address]    Culture   Final    ABUNDANT ENTEROCOCCUS FAECALIS ABUNDANT ESCHERICHIA COLI ABUNDANT PROTEUS MIRABILIS NO ANAEROBES ISOLATED VANCOMYCIN  RESISTANT ENTEROCOCCUS ISOLATED    Report Status 03/12/2024 FINAL  Final   Organism ID, Bacteria ESCHERICHIA COLI  Final   Organism ID, Bacteria PROTEUS MIRABILIS  Final   Organism ID, Bacteria ENTEROCOCCUS FAECALIS  Final      Susceptibility   Escherichia coli - MIC*    AMPICILLIN  <=2 SENSITIVE Sensitive     CEFEPIME  <=0.12 SENSITIVE Sensitive     CEFTAZIDIME  <=1 SENSITIVE Sensitive     CEFTRIAXONE  <=0.25 SENSITIVE Sensitive     CIPROFLOXACIN  <=0.25 SENSITIVE Sensitive     GENTAMICIN <=1 SENSITIVE Sensitive     IMIPENEM <=0.25 SENSITIVE Sensitive     TRIMETH/SULFA <=20 SENSITIVE Sensitive     AMPICILLIN /SULBACTAM <=2 SENSITIVE Sensitive     PIP/TAZO <=4 SENSITIVE Sensitive ug/mL    * ABUNDANT ESCHERICHIA COLI   Enterococcus faecalis - MIC*    AMPICILLIN  <=2 SENSITIVE Sensitive     VANCOMYCIN  >=32 RESISTANT Resistant     GENTAMICIN SYNERGY SENSITIVE Sensitive     * ABUNDANT ENTEROCOCCUS FAECALIS   Proteus mirabilis - MIC*    AMPICILLIN  <=2 SENSITIVE Sensitive     CEFEPIME  <=0.12 SENSITIVE Sensitive     CEFTAZIDIME  <=1 SENSITIVE Sensitive     CEFTRIAXONE  <=0.25 SENSITIVE Sensitive     CIPROFLOXACIN  <=0.25 SENSITIVE Sensitive     GENTAMICIN <=1 SENSITIVE Sensitive     IMIPENEM <=0.25 SENSITIVE Sensitive     TRIMETH/SULFA <=20 SENSITIVE Sensitive     AMPICILLIN /SULBACTAM <=2 SENSITIVE Sensitive     PIP/TAZO <=4 SENSITIVE Sensitive ug/mL    * ABUNDANT PROTEUS MIRABILIS  Culture, blood (single) w Reflex to ID Panel     Status: None (Preliminary result)    Collection Time: 03/10/24  5:29 PM   Specimen: BLOOD  Result Value Ref Range Status   Specimen Description BLOOD BLOOD LEFT ARM BLA  Final   Special Requests   Final    BOTTLES DRAWN AEROBIC AND ANAEROBIC Blood Culture adequate volume   Culture  Final    NO GROWTH 2 DAYS Performed at [facility], [address] [place] [place], [place] [address]    Report Status PENDING  Incomplete    Procedures and diagnostic studies:  ECHOCARDIOGRAM COMPLETE Result Date: 03/11/2024    ECHOCARDIOGRAM REPORT   Patient Name:   [name] Young. Date of Exam: 03/11/2024 Medical Rec #:  [id number]            Height:       70.0 in Accession #:    [id number]           Weight:       216.3 lb Date of Birth:  03-Jun-1941           BSA:          2.158 m Patient Age:    82 years             BP:           104/61 mmHg Patient Gender: M                    HR:           67 bpm. Exam Location:  [facility] Procedure: 2D Echo, Cardiac Doppler and Color Doppler (Both Spectral and Color            Flow Doppler were utilized during procedure). Indications:     Bacteremia R78.81  History:         Patient has prior history of Echocardiogram examinations, most                  recent 11/22/2023.  Sonographer:     [name] Furnace Referring Phys:  [id number] DAV[address] P FITZGERALD Diagnosing Phys: Lonni Hanson MD IMPRESSIONS  1. Left ventricular ejection fraction, by estimation, is 55 to 60%. The left ventricle has normal function. Left ventricular endocardial border not optimally defined to evaluate regional wall motion. There is mild left ventricular hypertrophy. Left ventricular diastolic parameters are consistent with Grade I diastolic dysfunction (impaired relaxation).  2. Right ventricular systolic function is normal. The right ventricular size is normal. Mildly increased right ventricular wall thickness.  3. The mitral valve is abnormal. Trivial mitral valve regurgitation. No evidence of mitral stenosis.  4. The aortic valve has an indeterminant  number of cusps. There is mild thickening of the aortic valve. Aortic valve regurgitation is mild. Mild to moderate aortic valve stenosis. Aortic valve area, by VTI measures 1.53 cm. Aortic valve mean gradient measures 16.0 mmHg.  5. Aortic dilatation noted. There is borderline dilatation of the aortic root, measuring 40 mm. Comparison(s): A prior study was performed on 11/22/2023. No significant change from prior study. FINDINGS  Left Ventricle: Left ventricular ejection fraction, by estimation, is 55 to 60%. The left ventricle has normal function. Left ventricular endocardial border not optimally defined to evaluate regional wall motion. The left ventricular internal cavity size was normal in size. There is mild left ventricular hypertrophy. Left ventricular diastolic parameters are consistent with Grade I diastolic dysfunction (impaired relaxation). Right Ventricle: The right ventricular size is normal. Mildly increased right ventricular wall thickness. Right ventricular systolic function is normal. Left Atrium: Left atrial size was normal in size. Right Atrium: Right atrial size was normal in size. Pericardium: The pericardium was not well visualized. Mitral Valve: The mitral valve is abnormal. There is severe thickening of the mitral valve leaflet(s). Mild mitral annular calcification. Trivial mitral valve regurgitation. No evidence of mitral valve stenosis. MV  peak gradient, 5.7 mmHg. The mean mitral valve gradient is 2.0 mmHg. Tricuspid Valve: The tricuspid valve is not well visualized. Tricuspid valve regurgitation is not demonstrated. Aortic Valve: The aortic valve has an indeterminant number of cusps. There is mild thickening of the aortic valve. Aortic valve regurgitation is mild. Mild to moderate aortic stenosis is present. Aortic valve mean gradient measures 16.0 mmHg. Aortic valve peak gradient measures 27.7 mmHg. Aortic valve area, by VTI measures 1.53 cm. Pulmonic Valve: The pulmonic valve was not  well visualized. Pulmonic valve regurgitation is not visualized. No evidence of pulmonic stenosis. Aorta: Aortic dilatation noted. There is borderline dilatation of the aortic root, measuring 40 mm. Pulmonary Artery: The pulmonary artery is not well seen. Venous: The inferior vena cava was not well visualized. IAS/Shunts: The interatrial septum was not well visualized.  LEFT VENTRICLE PLAX 2D LVIDd:         5.20 cm   Diastology LVIDs:         3.60 cm   LV e' medial:    5.87 cm/s LV PW:         1.20 cm   LV E/e' medial:  12.2 LV IVS:        1.20 cm   LV e' lateral:   7.29 cm/s LVOT diam:     2.20 cm   LV E/e' lateral: 9.8 LV SV:         76 LV SV Index:   35 LVOT Area:     3.80 cm  RIGHT VENTRICLE RV Basal diam:  2.80 cm RV Mid diam:    2.00 cm RV S prime:     12.30 cm/s TAPSE (M-mode): 2.4 cm LEFT ATRIUM             Index        RIGHT ATRIUM           Index LA diam:        3.60 cm 1.67 cm/m   RA Area:     12.30 cm LA Vol (A2C):   61.0 ml 28.27 ml/m  RA Volume:   24.70 ml  11.45 ml/m LA Vol (A4C):   50.2 ml 23.26 ml/m LA Biplane Vol: 56.9 ml 26.37 ml/m  AORTIC VALVE AV Area (Vmax):    1.32 cm AV Area (Vmean):   1.26 cm AV Area (VTI):     1.53 cm AV Vmax:           263.00 cm/s AV Vmean:          184.667 cm/s AV VTI:            0.498 m AV Peak Grad:      27.7 mmHg AV Mean Grad:      16.0 mmHg LVOT Vmax:         91.00 cm/s LVOT Vmean:        61.300 cm/s LVOT VTI:          0.200 m LVOT/AV VTI ratio: 0.40  AORTA Ao Root diam: 4.00 cm MITRAL VALVE                TRICUSPID VALVE MV Area (PHT): 2.38 cm     TR Peak grad:   33.4 mmHg MV Area VTI:   2.71 cm     TR Vmax:        289.00 cm/s MV Peak grad:  5.7 mmHg MV Mean grad:  2.0 mmHg     SHUNTS MV Vmax:  1.19 m/s     Systemic VTI:  0.20 m MV Vmean:      68.4 cm/s    Systemic Diam: 2.20 cm MV Decel Time: 319 msec MV E velocity: 71.80 cm/s MV A velocity: 128.00 cm/s MV E/A ratio:  0.56 Lonni End MD Electronically signed by Lonni Hanson MD Signature  Date/Time: 03/11/2024/3:15:40 PM    Final                LOS: 6 days   Haylen Shelnutt  [facility]   Pager on [email or web address]. If 7PM-7AM, please contact night-coverage at [email or web address]     03/12/2024, 1:28 PM

## 2024-03-12 NOTE — TOC Progression Note (Signed)
 Transition of Care (TOC) - Progression Note    Patient Details  Name: [name] Young. MRN: [id number] Date of Birth: 1941-07-02  Transition of Care [facility]) CM/SW Contact  Lauraine JAYSON Carpen, LCSW Phone Number: 03/12/2024, 10:33 AM  Clinical Narrative:  Gave SNF bed offers to patient and wife to review.   Expected Discharge Plan: Home/Self Care Barriers to Discharge: Continued Medical Work up  Expected Discharge Plan and Services     Post Acute Care Choice: Durable Medical Equipment Living arrangements for the past 2 months: Single Family Home                                       Social Determinants of Health (SDOH) Interventions SDOH Screenings   Food Insecurity: Unknown (03/06/2024)  Housing: Low Risk  (03/06/2024)  Transportation Needs: No Transportation Needs (03/06/2024)  Utilities: Not At Risk (03/06/2024)  Depression (PHQ2-9): Low Risk  (12/31/2023)  Financial Resource Strain: Low Risk  (12/15/2023)   [facility] from [facility]  Physical Activity: Unknown (10/08/2023)  Social Connections: Moderately Isolated (03/06/2024)  Stress: Patient Declined (10/08/2023)  Tobacco Use: Medium Risk (03/09/2024)    Readmission Risk Interventions    03/07/2024    3:03 PM  Readmission Risk Prevention Plan  Transportation Screening Complete  PCP or Specialist Appt within 3-5 Days Complete  Social Work Consult for Recovery Care Planning/Counseling Complete  Palliative Care Screening Not Applicable  Medication Review Oceanographer) Complete

## 2024-03-12 NOTE — Progress Notes (Signed)
 INFECTIOUS DISEASE PROGRESS NOTE Date of Admission:  03/06/2024     ID: [name] LULLA Bernardo Mickey. is a 84 y.o. male with  enterococcal bacteremia, liver abscess Principal Problem:   Perihepatic abscess (HCC) Active Problems:   Hyperlipidemia associated with type 2 diabetes mellitus (HCC)   Obesity, Class I, BMI 30-34.9   DNR (do not resuscitate)   S/P CABG x 3   FTT (failure to thrive) in adult   Anemia   PAF (paroxysmal atrial fibrillation) (HCC)   Protein-calorie malnutrition, severe (HCC)   Insomnia   General weakness   Essential hypertension   Hyperkalemia   AKI (acute kidney injury) (HCC)   Malnutrition of moderate degree   Pressure injury of skin   Bacteremia   Subjective: No fevers, wbc 9. No abd pain. Changed to unasyn . TEE done and negative   ROS  Eleven systems are reviewed and negative except per hpi  Medications:  Antibiotics Given (last 72 hours)     Date/Time Action Medication Dose Rate   03/09/24 1413 New Bag/Given   DAPTOmycin (CUBICIN) 1,000 mg in sodium chloride  0.9 % IVPB 1,000 mg 140 mL/hr   03/09/24 1708 New Bag/Given   ceFEPIme  (MAXIPIME ) 2 g in sodium chloride  0.9 % 100 mL IVPB 2 g 200 mL/hr   03/09/24 2031 New Bag/Given   metroNIDAZOLE  (FLAGYL ) IVPB 500 mg 500 mg 100 mL/hr   03/10/24 0411 New Bag/Given   ceFEPIme  (MAXIPIME ) 2 g in sodium chloride  0.9 % 100 mL IVPB 2 g 200 mL/hr   03/10/24 0817 New Bag/Given   metroNIDAZOLE  (FLAGYL ) IVPB 500 mg 500 mg 100 mL/hr   03/10/24 1339 New Bag/Given   DAPTOmycin (CUBICIN) 1,000 mg in sodium chloride  0.9 % IVPB 1,000 mg 140 mL/hr   03/10/24 1831 New Bag/Given   piperacillin -tazobactam (ZOSYN ) IVPB 3.375 g 3.375 g 12.5 mL/hr   03/11/24 0510 New Bag/Given   piperacillin -tazobactam (ZOSYN ) IVPB 3.375 g 3.375 g 12.5 mL/hr   03/11/24 1339 New Bag/Given   piperacillin -tazobactam (ZOSYN ) IVPB 3.375 g 3.375 g 12.5 mL/hr   03/11/24 2224 New Bag/Given   [MAR Hold] Ampicillin -Sulbactam (UNASYN ) 3 g in sodium chloride   0.9 % 100 mL IVPB MAR Hold since Wed 03/12/2024 at 1151.Hold Reason: Transfer to a Procedural area 3 g 200 mL/hr   03/12/24 0624 New Bag/Given   [MAR Hold] Ampicillin -Sulbactam (UNASYN ) 3 g in sodium chloride  0.9 % 100 mL IVPB MAR Hold since Wed 03/12/2024 at 1151.Hold Reason: Transfer to a Procedural area 3 g 200 mL/hr       [MAR Hold] apixaban   5 mg Oral BID   [MAR Hold] cyanocobalamin   1,000 mcg Oral Q M,W,F   [MAR Hold] Fe Fum-Vit C-Vit B12-FA  1 capsule Oral QPC breakfast   [MAR Hold] feeding supplement  237 mL Oral TID BM   [MAR Hold] midodrine  10 mg Oral TID WC   [MAR Hold] mirtazapine   30 mg Oral QHS   [MAR Hold] multivitamin with minerals  1 tablet Oral Daily   [MAR Hold] rosuvastatin   10 mg Oral QHS   [MAR Hold] sodium chloride  flush  5 mL Intracatheter Q8H   [MAR Hold] tamsulosin   0.4 mg Oral QPC supper    Objective: Vital signs in last 24 hours: Temp:  [97.8 F (36.6 C)-98.5 F (36.9 C)] 97.9 F (36.6 C) (07/02 1245) Pulse Rate:  [64-85] 73 (07/02 1245) Resp:  [16-23] 23 (07/02 1245) BP: (95-120)/(50-65) 95/50 (07/02 1245) SpO2:  [97 %-100 %] 99 % (07/02 1245) Constitutional:  He is frail, chronically ill appearin, nad HENT: anicter Mouth/Throat: Oropharynx is clear and moist. No oropharyngeal exudate.  Cardiovascular: Normal rate, regular rhythm and normal heart sounds. Exam reveals no gallop and no friction rub.  No murmur heard.  Pulmonary/Chest: Effort normal and breath sounds normal. No respiratory distress. He has no wheezes.  Abdominal: mild ttp RUQ, JP drain with bloody drainage Lymphadenopathy:  He has no cervical adenopathy.  Neurological: He is alert and oriented to person, place, and time.  Skin: Skin is warm and dry. No rash noted. No erythema.  Psychiatric: He has a normal mood and affect. His behavior is normal.  Lab Results Recent Labs    03/11/24 0302  WBC 9.9  HGB 9.0*  HCT 30.7*  NA 139  K 4.8  CL 109  CO2 22  BUN 29*  CREATININE 1.00     Microbiology: Results for orders placed or performed during the hospital encounter of 03/06/24  Blood Culture (routine x 2)     Status: Abnormal   Collection Time: 03/06/24 11:56 AM   Specimen: BLOOD LEFT ARM  Result Value Ref Range Status   Specimen Description   Final    BLOOD LEFT ARM Performed at [facility], [address], [place], [place] [address]    Special Requests   Final    BOTTLES DRAWN AEROBIC AND ANAEROBIC Blood Culture adequate volume Performed at [facility], [address] [place], [place] [address]    Culture  Setup Time   Final    GRAM POSITIVE COCCI ANAEROBIC BOTTLE ONLY CRITICAL RESULT CALLED TO, READ BACK BY AND VERIFIED WITH: ALEX CHAPPELL AT 0802 03/08/24.PMF Performed at [facility], [address], [place], [place] [address]    Culture (A)  Final    ENTEROCOCCUS FAECALIS VANCOMYCIN  RESISTANT ENTEROCOCCUS ISOLATED    Report Status 03/10/2024 FINAL  Final   Organism ID, Bacteria ENTEROCOCCUS FAECALIS  Final      Susceptibility   Enterococcus faecalis - MIC*    AMPICILLIN  <=2 SENSITIVE Sensitive     VANCOMYCIN  >=32 RESISTANT Resistant     GENTAMICIN SYNERGY SENSITIVE Sensitive     * ENTEROCOCCUS FAECALIS  Blood Culture ID Panel (Reflexed)     Status: Abnormal   Collection Time: 03/06/24 11:56 AM  Result Value Ref Range Status   Enterococcus faecalis DETECTED (A) NOT DETECTED Final    Comment: CRITICAL RESULT CALLED TO, READ BACK BY AND VERIFIED WITH: ALEX CHAPPELL AT 0802 03/08/24.PMF    Enterococcus Faecium NOT DETECTED NOT DETECTED Final   Listeria monocytogenes NOT DETECTED NOT DETECTED Final   Staphylococcus species NOT DETECTED NOT DETECTED Final   Staphylococcus aureus (BCID) NOT DETECTED NOT DETECTED Final   Staphylococcus epidermidis NOT DETECTED NOT DETECTED Final   Staphylococcus lugdunensis NOT DETECTED NOT DETECTED Final   Streptococcus species NOT DETECTED NOT DETECTED Final   Streptococcus agalactiae  NOT DETECTED NOT DETECTED Final   Streptococcus pneumoniae NOT DETECTED NOT DETECTED Final   Streptococcus pyogenes NOT DETECTED NOT DETECTED Final   A.calcoaceticus-baumannii NOT DETECTED NOT DETECTED Final   Bacteroides fragilis NOT DETECTED NOT DETECTED Final   Enterobacterales NOT DETECTED NOT DETECTED Final   Enterobacter cloacae complex NOT DETECTED NOT DETECTED Final   Escherichia coli NOT DETECTED NOT DETECTED Final   Klebsiella aerogenes NOT DETECTED NOT DETECTED Final   Klebsiella oxytoca NOT DETECTED NOT DETECTED Final   Klebsiella pneumoniae NOT DETECTED NOT DETECTED Final   Proteus species NOT DETECTED NOT DETECTED Final  Salmonella species NOT DETECTED NOT DETECTED Final   Serratia marcescens NOT DETECTED NOT DETECTED Final   Haemophilus influenzae NOT DETECTED NOT DETECTED Final   Neisseria meningitidis NOT DETECTED NOT DETECTED Final   Pseudomonas aeruginosa NOT DETECTED NOT DETECTED Final   Stenotrophomonas maltophilia NOT DETECTED NOT DETECTED Final   Candida albicans NOT DETECTED NOT DETECTED Final   Candida auris NOT DETECTED NOT DETECTED Final   Candida glabrata NOT DETECTED NOT DETECTED Final   Candida krusei NOT DETECTED NOT DETECTED Final   Candida parapsilosis NOT DETECTED NOT DETECTED Final   Candida tropicalis NOT DETECTED NOT DETECTED Final   Cryptococcus neoformans/gattii NOT DETECTED NOT DETECTED Final   Vancomycin  resistance DETECTED (A) NOT DETECTED Final    Comment: CRITICAL RESULT CALLED TO, READ BACK BY AND VERIFIED WITH: ALEX CHAPPELL AT 0802 03/08/24.PMF Performed at [facility], [address] [place], [place] [address]   Blood Culture (routine x 2)     Status: None   Collection Time: 03/06/24  2:25 PM   Specimen: BLOOD  Result Value Ref Range Status   Specimen Description BLOOD BLOOD LEFT HAND [facility]  Final   Special Requests   Final    BOTTLES DRAWN AEROBIC AND ANAEROBIC Blood Culture adequate volume   Culture   Final    NO GROWTH  5 DAYS Performed at [facility], [address], [place], [place] [address]    Report Status 03/11/2024 FINAL  Final  Aerobic/Anaerobic Culture w Gram Stain (surgical/deep wound)     Status: None (Preliminary result)   Collection Time: 03/07/24  4:00 PM   Specimen: Abscess  Result Value Ref Range Status   Specimen Description   Final    ABSCESS Performed at [facility], [address] [place], [place] [address]    Special Requests   Final    NONE Performed at [facility], [address] [place], [place] [address]    Gram Stain   Final    MODERATE WBC PRESENT, PREDOMINANTLY PMN MODERATE GRAM POSITIVE COCCI RARE GRAM NEGATIVE RODS Performed at [facility], [address] [place], [place] [address]    Culture   Final    ABUNDANT VANCOMYCIN  RESISTANT ENTEROCOCCUS ABUNDANT ESCHERICHIA COLI ABUNDANT PROTEUS MIRABILIS NO ANAEROBES ISOLATED; CULTURE IN PROGRESS FOR 5 DAYS    Report Status PENDING  Incomplete   Organism ID, Bacteria ESCHERICHIA COLI  Final   Organism ID, Bacteria PROTEUS MIRABILIS  Final   Organism ID, Bacteria VANCOMYCIN  RESISTANT ENTEROCOCCUS  Final      Susceptibility   Escherichia coli - MIC*    AMPICILLIN  <=2 SENSITIVE Sensitive     CEFEPIME  <=0.12 SENSITIVE Sensitive     CEFTAZIDIME  <=1 SENSITIVE Sensitive     CEFTRIAXONE  <=0.25 SENSITIVE Sensitive     CIPROFLOXACIN  <=0.25 SENSITIVE Sensitive     GENTAMICIN <=1 SENSITIVE Sensitive     IMIPENEM <=0.25 SENSITIVE Sensitive     TRIMETH/SULFA <=20 SENSITIVE Sensitive     AMPICILLIN /SULBACTAM <=2 SENSITIVE Sensitive     PIP/TAZO <=4 SENSITIVE Sensitive ug/mL    * ABUNDANT ESCHERICHIA COLI   Proteus mirabilis - MIC*    AMPICILLIN  <=2 SENSITIVE Sensitive     CEFEPIME  <=0.12 SENSITIVE Sensitive     CEFTAZIDIME  <=1 SENSITIVE Sensitive     CEFTRIAXONE  <=0.25 SENSITIVE Sensitive     CIPROFLOXACIN  <=0.25 SENSITIVE Sensitive     GENTAMICIN <=1 SENSITIVE Sensitive      IMIPENEM <=0.25 SENSITIVE Sensitive     TRIMETH/SULFA <=  20 SENSITIVE Sensitive     AMPICILLIN /SULBACTAM <=2 SENSITIVE Sensitive     PIP/TAZO <=4 SENSITIVE Sensitive ug/mL    * ABUNDANT PROTEUS MIRABILIS   Vancomycin  resistant enterococcus - MIC*    AMPICILLIN  <=2 SENSITIVE Sensitive     VANCOMYCIN  >=32 RESISTANT Resistant     GENTAMICIN SYNERGY SENSITIVE Sensitive     * ABUNDANT VANCOMYCIN  RESISTANT ENTEROCOCCUS  Culture, blood (single) w Reflex to ID Panel     Status: None (Preliminary result)   Collection Time: 03/10/24  5:29 PM   Specimen: BLOOD  Result Value Ref Range Status   Specimen Description BLOOD BLOOD LEFT ARM BLA  Final   Special Requests   Final    BOTTLES DRAWN AEROBIC AND ANAEROBIC Blood Culture adequate volume   Culture   Final    NO GROWTH 2 DAYS Performed at [facility], [address] [place], [place] [address]    Report Status PENDING  Incomplete    Studies/Results: ECHOCARDIOGRAM COMPLETE Result Date: 03/11/2024    ECHOCARDIOGRAM REPORT   Patient Name:   Norrin Shreffler. Date of Exam: 03/11/2024 Medical Rec #:  [id number]            Height:       70.0 in Accession #:    [id number]           Weight:       216.3 lb Date of Birth:  Aug 09, 1941           BSA:          2.158 m Patient Age:    82 years             BP:           104/61 mmHg Patient Gender: M                    HR:           67 bpm. Exam Location:  [facility] Procedure: 2D Echo, Cardiac Doppler and Color Doppler (Both Spectral and Color            Flow Doppler were utilized during procedure). Indications:     Bacteremia R78.81  History:         Patient has prior history of Echocardiogram examinations, most                  recent 11/22/2023.  Sonographer:     [name] Furnace Referring Phys:  [id number] Jaimere Feutz P Tarin Johndrow Diagnosing Phys: Lonni Hanson MD IMPRESSIONS  1. Left ventricular ejection fraction, by estimation, is 55 to 60%. The left ventricle has normal function. Left ventricular endocardial border not  optimally defined to evaluate regional wall motion. There is mild left ventricular hypertrophy. Left ventricular diastolic parameters are consistent with Grade I diastolic dysfunction (impaired relaxation).  2. Right ventricular systolic function is normal. The right ventricular size is normal. Mildly increased right ventricular wall thickness.  3. The mitral valve is abnormal. Trivial mitral valve regurgitation. No evidence of mitral stenosis.  4. The aortic valve has an indeterminant number of cusps. There is mild thickening of the aortic valve. Aortic valve regurgitation is mild. Mild to moderate aortic valve stenosis. Aortic valve area, by VTI measures 1.53 cm. Aortic valve mean gradient measures 16.0 mmHg.  5. Aortic dilatation noted. There is borderline dilatation of the aortic root, measuring 40 mm. Comparison(s): A prior study was performed on 11/22/2023. No significant change from prior study. FINDINGS  Left Ventricle: Left  ventricular ejection fraction, by estimation, is 55 to 60%. The left ventricle has normal function. Left ventricular endocardial border not optimally defined to evaluate regional wall motion. The left ventricular internal cavity size was normal in size. There is mild left ventricular hypertrophy. Left ventricular diastolic parameters are consistent with Grade I diastolic dysfunction (impaired relaxation). Right Ventricle: The right ventricular size is normal. Mildly increased right ventricular wall thickness. Right ventricular systolic function is normal. Left Atrium: Left atrial size was normal in size. Right Atrium: Right atrial size was normal in size. Pericardium: The pericardium was not well visualized. Mitral Valve: The mitral valve is abnormal. There is severe thickening of the mitral valve leaflet(s). Mild mitral annular calcification. Trivial mitral valve regurgitation. No evidence of mitral valve stenosis. MV peak gradient, 5.7 mmHg. The mean mitral valve gradient is 2.0 mmHg.  Tricuspid Valve: The tricuspid valve is not well visualized. Tricuspid valve regurgitation is not demonstrated. Aortic Valve: The aortic valve has an indeterminant number of cusps. There is mild thickening of the aortic valve. Aortic valve regurgitation is mild. Mild to moderate aortic stenosis is present. Aortic valve mean gradient measures 16.0 mmHg. Aortic valve peak gradient measures 27.7 mmHg. Aortic valve area, by VTI measures 1.53 cm. Pulmonic Valve: The pulmonic valve was not well visualized. Pulmonic valve regurgitation is not visualized. No evidence of pulmonic stenosis. Aorta: Aortic dilatation noted. There is borderline dilatation of the aortic root, measuring 40 mm. Pulmonary Artery: The pulmonary artery is not well seen. Venous: The inferior vena cava was not well visualized. IAS/Shunts: The interatrial septum was not well visualized.  LEFT VENTRICLE PLAX 2D LVIDd:         5.20 cm   Diastology LVIDs:         3.60 cm   LV e' medial:    5.87 cm/s LV PW:         1.20 cm   LV E/e' medial:  12.2 LV IVS:        1.20 cm   LV e' lateral:   7.29 cm/s LVOT diam:     2.20 cm   LV E/e' lateral: 9.8 LV SV:         76 LV SV Index:   35 LVOT Area:     3.80 cm  RIGHT VENTRICLE RV Basal diam:  2.80 cm RV Mid diam:    2.00 cm RV S prime:     12.30 cm/s TAPSE (M-mode): 2.4 cm LEFT ATRIUM             Index        RIGHT ATRIUM           Index LA diam:        3.60 cm 1.67 cm/m   RA Area:     12.30 cm LA Vol (A2C):   61.0 ml 28.27 ml/m  RA Volume:   24.70 ml  11.45 ml/m LA Vol (A4C):   50.2 ml 23.26 ml/m LA Biplane Vol: 56.9 ml 26.37 ml/m  AORTIC VALVE AV Area (Vmax):    1.32 cm AV Area (Vmean):   1.26 cm AV Area (VTI):     1.53 cm AV Vmax:           263.00 cm/s AV Vmean:          184.667 cm/s AV VTI:            0.498 m AV Peak Grad:      27.7 mmHg AV Mean Grad:  16.0 mmHg LVOT Vmax:         91.00 cm/s LVOT Vmean:        61.300 cm/s LVOT VTI:          0.200 m LVOT/AV VTI ratio: 0.40  AORTA Ao Root diam: 4.00  cm MITRAL VALVE                TRICUSPID VALVE MV Area (PHT): 2.38 cm     TR Peak grad:   33.4 mmHg MV Area VTI:   2.71 cm     TR Vmax:        289.00 cm/s MV Peak grad:  5.7 mmHg MV Mean grad:  2.0 mmHg     SHUNTS MV Vmax:       1.19 m/s     Systemic VTI:  0.20 m MV Vmean:      68.4 cm/s    Systemic Diam: 2.20 cm MV Decel Time: 319 msec MV E velocity: 71.80 cm/s MV A velocity: 128.00 cm/s MV E/A ratio:  0.56 Lonni End MD Electronically signed by Lonni Hanson MD Signature Date/Time: 03/11/2024/3:15:40 PM    Final     Assessment/Plan: [name] LULLA Bernardo Mickey. is a 83 y.o. male with a complicated medical history including A-fib, hypertension, chronic kidney disease, diabetes, prior history of streptococcal endocarditis, history of E. coli bacteremia, admitted with findings of perihepatic abscess on outpatient imaging.  He had a complicated course over the last few months and had recurrent pleural effusions requiring multiple thoracentesis complicated by right hemothorax and acute blood loss anemia in March.  He had been having some increasing abdominal pain at home and CT scan of his chest abdomen and pelvis was performed June 16.  This showed removal of right-sided pleural drain decreased size of loculated right pleural effusion decreased size of right pleural mass but a new subcapsular fluid collection adjacent to the inferior right hepatic lobe measuring up to 7.5 cm.  There is also intramuscular wall thickening along the right 11th and 12th ribs.  Patient underwent IR CT-guided aspiration of the right lateral abdominal wall right perihepatic fluid collection with 450 mL of bloody purulent fluid removed.  Gram stain showed moderate gram-positive cocci and rare gram-negative rods. Since admission his blood cultures have turned positive for Enterococcus which is vancomycin  resistant but sensitive to ampicillin .  No follow-up blood cultures have been done. Since admission he was on cefepime  and metronidazole   and daptomycin was started June 28. Drain cx pending.     7/1- improving. Changed ot zosyn . FU BCX 6/30 ngtd.  7/2  tee neg,  fu bcx neg  Recommendations Enterococcal bacteremia- amp S. Source liver abscess. Effective treatment started 6/28 with dapto since VRE (but amp S). Day 5 of treatment.  Repeat Bcx 6/30 ngtd TEE neg endocarditis   Liver abscess- s/p drain. GS mixed enterococcus, E coli and  and proteus  Consolidated to unasyn   Will be able to dc on augmentin  875 bid- would continue until repeat CT scan is done and drain removed and possibly for a week after.  Thank you very much for the consult. Will follow with you.  Alm SHAUNNA Needle   03/12/2024, 12:47 PM

## 2024-03-12 NOTE — Progress Notes (Signed)
 Referring Physician(s): Greig Free, MD  Supervising Physician: Karalee Beat  Patient Status:  [facility] - In-pt  Chief Complaint: Abdominal wall abscess  Subjective: Resting in bed.  States his drain continues with significant output.  No questions related to drain.   Allergies: Patient has no known allergies.  Medications: Prior to Admission medications   Medication Sig Start Date End Date Taking? Authorizing Provider  aspirin  EC 81 MG tablet Take 81 mg by mouth daily. Swallow whole.   Yes [provider]  bisacodyl  (DULCOLAX) 5 MG EC tablet Take 10 mg by mouth at bedtime.   Yes [provider]  Cholecalciferol  (VITAMIN D3) 25 MCG (1000 UT) CAPS Take 1 capsule (1,000 Units total) by mouth daily. 05/24/20  Yes Rilla Baller, MD  Cyanocobalamin  (B-12) 1000 MCG CAPS Take 1 capsule by mouth every Monday, Wednesday, and Friday. 07/16/23  Yes Rilla Baller, MD  feeding supplement (ENSURE ENLIVE / ENSURE PLUS) LIQD Take 237 mLs by mouth 2 (two) times daily between meals. Patient taking differently: Take 237 mLs by mouth daily. 01/05/23  Yes Josette Ade, MD  ferrous sulfate  325 (65 FE) MG EC tablet Take 1 tablet (325 mg total) by mouth daily. 01/09/24  Yes End, Lonni, MD  lisinopril  (ZESTRIL ) 2.5 MG tablet Take 2.5 mg by mouth daily. 12/19/23 12/18/24 Yes [provider]  Magnesium  250 MG TABS Take 250 mg by mouth daily.   Yes [provider]  metoprolol  tartrate (LOPRESSOR ) 25 MG tablet Take 0.5 tablets (12.5 mg total) by mouth 2 (two) times daily. 12/10/23  Yes Rilla Baller, MD  mirtazapine  (REMERON ) 30 MG tablet Take 1 tablet (30 mg total) by mouth at bedtime. 12/31/23  Yes Rilla Baller, MD  Multiple Vitamins-Minerals (PRESERVISION AREDS 2 PO) Take 1 capsule by mouth daily.   Yes [provider]  polyethylene glycol powder (GLYCOLAX /MIRALAX ) 17 GM/SCOOP powder Take 8.5-17 g by mouth daily. Patient taking differently: Take  8.5-17 g by mouth daily as needed for moderate constipation. 12/10/23  Yes Rilla Baller, MD  rosuvastatin  (CRESTOR ) 10 MG tablet TAKE 1 TABLET BY MOUTH EVERY DAY 12/19/23  Yes Vivienne Lonni Ingle, NP  spironolactone (ALDACTONE) 25 MG tablet Take 25 mg by mouth daily. 12/19/23 03/18/24 Yes [provider]  tamsulosin  (FLOMAX ) 0.4 MG CAPS capsule Take 1 capsule (0.4 mg total) by mouth daily. 07/16/23  Yes Rilla Baller, MD  torsemide  (DEMADEX ) 20 MG tablet Take 1 tablet (20 mg total) by mouth every other day. 01/25/24 04/24/24 Yes End, Lonni, MD  ELIQUIS  5 MG TABS tablet TAKE 1 TABLET BY MOUTH TWICE A DAY Patient not taking: Reported on 02/08/2024 09/19/23   End, Lonni, MD  glucose blood (TRUE METRIX BLOOD GLUCOSE TEST) test strip Use as instructed to check blood sugar once a day 03/06/23   Rilla Baller, MD     Vital Signs: BP 108/61   Pulse 83   Temp 97.9 F (36.6 C) (Oral)   Resp 20   Ht 5' 10 (1.778 m)   Wt 206 lb 2.1 oz (93.5 kg)   SpO2 99%   BMI 29.58 kg/m   Physical Exam NAD, alert Abdomen: soft, non-tender.  Drain in place. Bloody output in bulb.   Imaging: ECHO TEE Result Date: 03/12/2024    TRANSESOPHOGEAL ECHO REPORT   Patient Name:   [name] Young. Date of Exam: 03/12/2024 Medical Rec #:  [id number]            Height:  70.0 in Accession #:    [id number]           Weight:       216.3 lb Date of Birth:  [birth year]           BSA:          2.158 m Patient Age:    83 years             BP:           104/63 mmHg Patient Gender: M                    HR:           89 bpm. Exam Location:  [facility] Procedure: Transesophageal Echo, Color Doppler and Cardiac Doppler (Both            Spectral and Color Flow Doppler were utilized during procedure). Indications:     Bacteremia R78.81  History:         Patient has prior history of Echocardiogram examinations, most                  recent 03/11/2024. CAD, Prior CABG; Signs/Symptoms:Bacteremia.  Sonographer:      Ashley McNeely-Sloane Referring Phys:  [id number] [facility] CAREY Diagnosing Phys: Redell Cave MD PROCEDURE: The transesophogeal probe was passed without difficulty through the esophogus of the patient. Sedation performed by different physician. The patient developed no complications during the procedure.  IMPRESSIONS  1. Left ventricular ejection fraction, by estimation, is 55 to 60%. The left ventricle has normal function.  2. Right ventricular systolic function is normal. The right ventricular size is normal.  3. No left atrial/left atrial appendage thrombus was detected.  4. The mitral valve is normal in structure. Mild mitral valve regurgitation.  5. The aortic valve is tricuspid. Aortic valve regurgitation is mild. Aortic valve sclerosis/calcification is present, without any evidence of aortic stenosis. Conclusion(s)/Recommendation(s): No evidence of vegetation/infective endocarditis on this transesophageael echocardiogram. FINDINGS  Left Ventricle: Left ventricular ejection fraction, by estimation, is 55 to 60%. The left ventricle has normal function. The left ventricular internal cavity size was normal in size. Right Ventricle: The right ventricular size is normal. No increase in right ventricular wall thickness. Right ventricular systolic function is normal. Left Atrium: Left atrial size was normal in size. No left atrial/left atrial appendage thrombus was detected. Right Atrium: Right atrial size was normal in size. Pericardium: There is no evidence of pericardial effusion. Mitral Valve: The mitral valve is normal in structure. Mild mitral valve regurgitation. Tricuspid Valve: The tricuspid valve is normal in structure. Tricuspid valve regurgitation is mild. Aortic Valve: The aortic valve is tricuspid. Aortic valve regurgitation is mild. Aortic valve sclerosis/calcification is present, without any evidence of aortic stenosis. Pulmonic Valve: The pulmonic valve was normal in structure. Pulmonic valve  regurgitation is trivial. Aorta: The aortic root is normal in size and structure. IAS/Shunts: No atrial level shunt detected by color flow Doppler. Redell Cave MD Electronically signed by Redell Cave MD Signature Date/Time: 03/12/2024/2:49:23 PM    Final    ECHOCARDIOGRAM COMPLETE Result Date: 03/11/2024    ECHOCARDIOGRAM REPORT   Patient Name:   [name] Young. Date of Exam: 03/11/2024 Medical Rec #:  [id number]            Height:       70.0 in Accession #:    [id number]           Weight:  216.3 lb Date of Birth:  September 03, 1941           BSA:          2.158 m Patient Age:    83 years             BP:           104/61 mmHg Patient Gender: M                    HR:           67 bpm. Exam Location:  [facility] Procedure: 2D Echo, Cardiac Doppler and Color Doppler (Both Spectral and Color            Flow Doppler were utilized during procedure). Indications:     Bacteremia R78.81  History:         Patient has prior history of Echocardiogram examinations, most                  recent 11/22/2023.  Sonographer:     [name] Furnace Referring Phys:  [id number] DAVID P FITZGERALD Diagnosing Phys: Lonni Hanson MD IMPRESSIONS  1. Left ventricular ejection fraction, by estimation, is 55 to 60%. The left ventricle has normal function. Left ventricular endocardial border not optimally defined to evaluate regional wall motion. There is mild left ventricular hypertrophy. Left ventricular diastolic parameters are consistent with Grade I diastolic dysfunction (impaired relaxation).  2. Right ventricular systolic function is normal. The right ventricular size is normal. Mildly increased right ventricular wall thickness.  3. The mitral valve is abnormal. Trivial mitral valve regurgitation. No evidence of mitral stenosis.  4. The aortic valve has an indeterminant number of cusps. There is mild thickening of the aortic valve. Aortic valve regurgitation is mild. Mild to moderate aortic valve stenosis. Aortic valve area, by VTI measures  1.53 cm. Aortic valve mean gradient measures 16.0 mmHg.  5. Aortic dilatation noted. There is borderline dilatation of the aortic root, measuring 40 mm. Comparison(s): A prior study was performed on 11/22/2023. No significant change from prior study. FINDINGS  Left Ventricle: Left ventricular ejection fraction, by estimation, is 55 to 60%. The left ventricle has normal function. Left ventricular endocardial border not optimally defined to evaluate regional wall motion. The left ventricular internal cavity size was normal in size. There is mild left ventricular hypertrophy. Left ventricular diastolic parameters are consistent with Grade I diastolic dysfunction (impaired relaxation). Right Ventricle: The right ventricular size is normal. Mildly increased right ventricular wall thickness. Right ventricular systolic function is normal. Left Atrium: Left atrial size was normal in size. Right Atrium: Right atrial size was normal in size. Pericardium: The pericardium was not well visualized. Mitral Valve: The mitral valve is abnormal. There is severe thickening of the mitral valve leaflet(s). Mild mitral annular calcification. Trivial mitral valve regurgitation. No evidence of mitral valve stenosis. MV peak gradient, 5.7 mmHg. The mean mitral valve gradient is 2.0 mmHg. Tricuspid Valve: The tricuspid valve is not well visualized. Tricuspid valve regurgitation is not demonstrated. Aortic Valve: The aortic valve has an indeterminant number of cusps. There is mild thickening of the aortic valve. Aortic valve regurgitation is mild. Mild to moderate aortic stenosis is present. Aortic valve mean gradient measures 16.0 mmHg. Aortic valve peak gradient measures 27.7 mmHg. Aortic valve area, by VTI measures 1.53 cm. Pulmonic Valve: The pulmonic valve was not well visualized. Pulmonic valve regurgitation is not visualized. No evidence of pulmonic stenosis. Aorta: Aortic dilatation noted. There is borderline dilatation  of the aortic  root, measuring 40 mm. Pulmonary Artery: The pulmonary artery is not well seen. Venous: The inferior vena cava was not well visualized. IAS/Shunts: The interatrial septum was not well visualized.  LEFT VENTRICLE PLAX 2D LVIDd:         5.20 cm   Diastology LVIDs:         3.60 cm   LV e' medial:    5.87 cm/s LV PW:         1.20 cm   LV E/e' medial:  12.2 LV IVS:        1.20 cm   LV e' lateral:   7.29 cm/s LVOT diam:     2.20 cm   LV E/e' lateral: 9.8 LV SV:         76 LV SV Index:   35 LVOT Area:     3.80 cm  RIGHT VENTRICLE RV Basal diam:  2.80 cm RV Mid diam:    2.00 cm RV S prime:     12.30 cm/s TAPSE (M-mode): 2.4 cm LEFT ATRIUM             Index        RIGHT ATRIUM           Index LA diam:        3.60 cm 1.67 cm/m   RA Area:     12.30 cm LA Vol (A2C):   61.0 ml 28.27 ml/m  RA Volume:   24.70 ml  11.45 ml/m LA Vol (A4C):   50.2 ml 23.26 ml/m LA Biplane Vol: 56.9 ml 26.37 ml/m  AORTIC VALVE AV Area (Vmax):    1.32 cm AV Area (Vmean):   1.26 cm AV Area (VTI):     1.53 cm AV Vmax:           263.00 cm/s AV Vmean:          184.667 cm/s AV VTI:            0.498 m AV Peak Grad:      27.7 mmHg AV Mean Grad:      16.0 mmHg LVOT Vmax:         91.00 cm/s LVOT Vmean:        61.300 cm/s LVOT VTI:          0.200 m LVOT/AV VTI ratio: 0.40  AORTA Ao Root diam: 4.00 cm MITRAL VALVE                TRICUSPID VALVE MV Area (PHT): 2.38 cm     TR Peak grad:   33.4 mmHg MV Area VTI:   2.71 cm     TR Vmax:        289.00 cm/s MV Peak grad:  5.7 mmHg MV Mean grad:  2.0 mmHg     SHUNTS MV Vmax:       1.19 m/s     Systemic VTI:  0.20 m MV Vmean:      68.4 cm/s    Systemic Diam: 2.20 cm MV Decel Time: 319 msec MV E velocity: 71.80 cm/s MV A velocity: 128.00 cm/s MV E/A ratio:  0.56 Lonni End MD Electronically signed by Lonni Hanson MD Signature Date/Time: 03/11/2024/3:15:40 PM    Final    CT ANGIO ABDOMEN W &/OR WO CONTRAST Result Date: 03/08/2024 CLINICAL DATA:  Left upper quadrant pain. Perihepatic drain with active  bleeding. EXAM: CT ANGIOGRAPHY ABDOMEN TECHNIQUE: Multidetector CT imaging of the abdomen was performed using the standard protocol  during bolus administration of intravenous contrast. Multiplanar reconstructed images and MIPs were obtained and reviewed to evaluate the vascular anatomy. RADIATION DOSE REDUCTION: This exam was performed according to the departmental dose-optimization program which includes automated exposure control, adjustment of the mA and/or kV according to patient size and/or use of iterative reconstruction technique. CONTRAST:  OMNIPAQUE  IOHEXOL  350 MG/ML SOLN COMPARISON:  03/06/2024. FINDINGS: VASCULAR Aorta: Normal caliber aorta without aneurysm, dissection, vasculitis or significant stenosis. Aortic atherosclerosis. Celiac: Patent without evidence of aneurysm, dissection, vasculitis or significant stenosis. SMA: Patent without evidence of aneurysm, dissection, vasculitis or significant stenosis. Renals: Both renal arteries are patent without evidence of aneurysm, dissection, vasculitis, fibromuscular dysplasia or significant stenosis. IMA: Patent without evidence of aneurysm, dissection, vasculitis or significant stenosis. Inflow: Patent without evidence of aneurysm, dissection, vasculitis or significant stenosis. Veins: No obvious venous abnormality within the limitations of this arterial phase study. Review of the MIP images confirms the above findings. NON-VASCULAR Lower chest: There is a trace right pleural effusion with with associated pleural thickening, unchanged. Atelectasis is noted at the lung bases bilaterally. Coronary artery calcifications are noted. Hepatobiliary: No focal abnormality is seen within the liver. The gallbladder is surgically absent. No biliary ductal dilatation is seen. There is a perihepatic complex collection with a drain terminating in the posterior perihepatic space. The collection continues into the lateral abdominal wall on the right. No evidence of  contrast extravasation is seen to suggest active hemorrhage. Pancreas: Unremarkable. No pancreatic ductal dilatation or surrounding inflammatory changes. Spleen: Normal in size without focal abnormality. Adrenals/Urinary Tract: The adrenal glands are within normal limits. The kidneys enhance symmetrically. Renal calculi are noted on the right. Hypodensities are noted in the kidneys bilaterally, likely cysts. No hydronephrosis bilaterally. Stomach/Bowel: There is a small hiatal hernia. No bowel obstruction, free air, or pneumatosis is seen. Scattered diverticula are present along the colon without evidence of diverticulitis. Lymphatic: Prominent lymph node is noted at the porta pedis, likely reactive. Other: Fat stranding is noted in the abdomen with a small amount of perihepatic free fluid. There is subcutaneous fat stranding and skin thickening along the right lateral abdominal wall. Musculoskeletal: Strongly wires are noted. Degenerative changes are present in the thoracolumbar spine. No acute osseous abnormality is seen. IMPRESSION: VASCULAR 1. No evidence of active hemorrhage. 2. Aortic atherosclerosis. NON-VASCULAR 1. Complex collection at the right lung base extending into the. Paddock space and right abdominal wall, decreased in size from the prior exam. Percutaneous drain placement is noted posteriorly. No active hemorrhage is seen. 2. Remaining findings are unchanged. Electronically Signed   By: Leita Birmingham M.D.   On: 03/08/2024 19:07    Labs:  CBC: Recent Labs    03/07/24 0508 03/07/24 1702 03/08/24 0455 03/08/24 1659 03/09/24 0818 03/11/24 0302  WBC 10.4  --  7.3  --  8.1 9.9  HGB 7.1*   < > 7.4* 8.7* 8.8* 9.0*  HCT 24.0*   < > 24.9* 28.7* 29.5* 30.7*  PLT 326  --  309  --  329 323   < > = values in this interval not displayed.    COAGS: Recent Labs    11/21/23 0902 03/07/24 0508  INR 1.4* 1.4*    BMP: Recent Labs    03/07/24 0921 03/07/24 1156 03/08/24 0455  03/09/24 0818 03/11/24 0302  NA 138  --  140 140 139  K 5.0 4.5 5.0 5.1 4.8  CL 108  --  109 111 109  CO2 24  --  24 24 22   GLUCOSE 128*  --  112* 106* 111*  BUN 59*  --  54* 40* 29*  CALCIUM  7.7*  --  8.0* 8.1* 8.4*  CREATININE 1.54*  --  1.41* 1.17 1.00  GFRNONAA 45*  --  50* >60 >60    LIVER FUNCTION TESTS: Recent Labs    09/17/23 1427 11/13/23 1034 11/20/23 1220 11/21/23 0239 11/25/23 0443 12/31/23 1238 03/06/24 1122 03/08/24 0455  BILITOT 0.4  --  0.6  --   --   --  0.6 0.7  AST 28  --  37  --   --   --  42* 18  ALT 25  --  38  --   --   --  35 20  ALKPHOS 87  --  68  --   --   --  109 76  PROT 6.6  --  7.0  --   --   --  7.8 5.7*  ALBUMIN  3.6*   < > 2.4*   < > 1.9* 3.0* 2.2* 1.6*   < > = values in this interval not displayed.    Assessment and Plan: Right abdominal wall infected hematoma Patient s/p R abdominal wall drain placement 6/27 with 450 mL removed at the time of procedure.  Still on with ongoing significant bloody output daily, >50 mL overnight.  Drain should be flushed regularly. Remains to bulb suction.   Plan: Continue TID flushes with 5 cc NS. Record output Q shift. Dressing changes QD or PRN if soiled.  Call IR APP or on call IR MD if difficulty flushing or sudden change in drain output.  Repeat imaging/possible drain injection once output < 10 mL/QD (excluding flush material). Consideration for drain removal if output is < 10 mL/QD (excluding flush material), pending discussion with the providing surgical service.  Discharge planning: Outpatient follow-up has been ordered for patient to return to [facility]  1-2 weeks after discharge for repeat imaging and drain evaluation. Schedulers will contact patient/caregiver to arrange. Continue once daily flushes at discharge.  Dressing changes PRN or if soiled/wet.   IR will continue to follow - please call with questions or concerns.    Electronically Signed: Latisha Lasch Sue-Ellen Shanelle Clontz,  PA 03/12/2024, 4:26 PM   I spent a total of 15 Minutes at the the patient's bedside AND on the patient's hospital floor or unit, greater than 50% of which was counseling/coordinating care for right abdominal wall infected hematoma.

## 2024-03-12 NOTE — Plan of Care (Signed)

## 2024-03-12 NOTE — Anesthesia Preprocedure Evaluation (Signed)
 Anesthesia Evaluation  Patient identified by MRN, date of birth, ID band Patient awake    Reviewed: Allergy & Precautions, NPO status , Patient's Chart, lab work & pertinent test results  History of Anesthesia Complications Negative for: history of anesthetic complications  Airway Mallampati: III  TM Distance: >3 FB Neck ROM: full    Dental  (+) Chipped   Pulmonary neg pulmonary ROS   Pulmonary exam normal        Cardiovascular hypertension, On Medications + CAD and + CABG  + dysrhythmias Atrial Fibrillation      Neuro/Psych  Neuromuscular disease  negative psych ROS   GI/Hepatic Neg liver ROS,GERD  ,,  Endo/Other  negative endocrine ROSdiabetes    Renal/GU Renal disease  negative genitourinary   Musculoskeletal   Abdominal   Peds  Hematology  (+) Blood dyscrasia, anemia   Anesthesia Other Findings Past Medical History: No date: Adenomatous colon polyp No date: Aortic atherosclerosis (HCC) No date: Aortic stenosis     Comment:  a. 11/2023 Echo: mild-mod AS, mean grad . No date: Arthritis No date: Ascending aorta dilatation (HCC)     Comment:  a.) TTE 11/23/2017: asc Ao measured 37 mm; b.) TTE               10/23/2021: Ao root measured 41 mm, asc Ao measured 38               mm; c.) TEE 10/28/2021: asc Ao measured 38 mm; d.) TTE               01/11/2022: Ao root 40 mm, asc Ao 39 mm 10/2021: Ascending cholangitis No date: BPH (benign prostatic hyperplasia) No date: CKD (chronic kidney disease), stage III (HCC) No date: Claustrophobia No date: Coronary artery disease     Comment:  a.) LHC 12/21/2017: 75% mLAD, 90% D1, 80-99% OM1/2, CTO               pRCA (L-R collaterals) --> CVTS consult. b.) 01/2018 CABG               x 3 (LIMA->LAD, VG->D1, VG->RPL); c. 01/2024 Cath: LM nl,               LAD 20p, 55m, D1 70ost, 90, LCX 30ost, OM1 80, OM2 99-               fills from collats from D1. RCA 100p CTO, RPAV  100 (fills              via collats from 3rd septal), VG->RPDA nl, LIMA->LAD nl,               VG->D1 nl. RHC PA 35/10 (8), PCWP 10, CO 6.5-->Med Rx. No date: Diverticulosis No date: Dyspnea 10/2021: Endocarditis of mitral valve     Comment:  In setting of bacteremia from ascending colangitis No date: GERD (gastroesophageal reflux disease) 11/2023: Hemothorax on right No date: HFrEF (heart failure with reduced ejection fraction) (HCC)     Comment:  a.) TTE 11/23/2017: EF 50-55%, mod MAC, triv TR, G1DD;               b.) TTE 10/23/2021: EF 40-45%, mild LAE, Ao sclerosis,               triv MR, G2DD; c.) TEE 10/28/2021: EF 40-45%, glob HK,               mobile vegitation on MV; d.) TTE 01/11/2022: EF 50-55%,  mild LVH, RVE, Ao sclerosis, mild MR/AR, G1DD No date: History of cholelithiasis No date: History of kidney stones     Comment:  ca ox [place] [facility]) now [place] No date: History of pneumonia No date: HLD (hyperlipidemia) No date: HTN (hypertension) No date: Ischemic cardiomyopathy     Comment:  a. 12/2022 Echo: EF 50-55%; b. 11/2023 Echo: EF 40-45%,               glob HK, mild LVH, GrI DD, nl RV fxn, mild MR, mild AI,               mild-mod AS, Ao root 43mm. No date: Jaundice     Comment:  age 29 No date: Long term current use of anticoagulant     Comment:  a.) apixaban  No date: Paroxysmal atrial fibrillation (HCC)     Comment:  a.) CHA2DS2VASc = 6 (age x 2, HFrEF, HTN, vascular               disease history, T2DM);  b.) rate/rhythm maintained on               oral metoprolol  succinate; chronically anticoagulated               with apixaban  No date: Pleural effusion     Comment:  a. 11/2023 CT chest: Large R ple effusion, partially               loculated-->s/p pleurx. No date: Pneumonia No date: Right-sided carotid artery disease (HCC)     Comment:  a.) carotid doppler 04/05/2022: 1-39% RICA No date: S/P CABG x 3     Comment:  a.) LIMA-LAD, SVG-diagonal,  SVG-PL branch of RCA No date: S/P cataract extraction and insertion of intraocular lens 2010: T2DM (type 2 diabetes mellitus) (HCC)  Past Surgical History: No date: CARPAL TUNNEL RELEASE; Bilateral No date: CATARACT EXTRACTION, BILATERAL 11/2012: COLONOSCOPY     Comment:  11 adenomatous polyps, diverticulosis, rec rpt 1 yr               Marianne) 12/2013: COLONOSCOPY     Comment:  3 polyps, diverticulosis, rec rpt 3 yrs Marianne) 06/2019: COLONOSCOPY     Comment:  6 polyps (TA), diverticulosis, f/u left open ended               Marianne) 01/18/2018: CORONARY ARTERY BYPASS GRAFT; N/A     Comment:  Procedure: CORONARY ARTERY BYPASS GRAFTING (CABG) x 3;               Using Left Internal Mammary Artery, and Right Greater               Saphenous Vein harvested Endoscopically, Coronary Artery               Endarterectomy;  Surgeon: [name] Hanford Coy, MD;                Location: [facility] OR;  Service: Open Heart Surgery;                Laterality: N/A; 10/23/2021: ERCP; N/A     Comment:  Procedure: ENDOSCOPIC RETROGRADE               CHOLANGIOPANCREATOGRAPHY (ERCP);  Surgeon: Aneita Gwendlyn DASEN, MD;  Location: [facility] ENDOSCOPY;  Service: Endoscopy;                Laterality: N/A; 01/08/2023: IR  CATHETER TUBE CHANGE 03/07/2024: IR PATIENT EVAL TECH 0-60 MINS 11/20/2023: IR PERC PLEURAL DRAIN W/INDWELL CATH W/IMG GUIDE 01/23/2023: IR RADIOLOGIST EVAL & MGMT 12/29/2022: IR THORACENTESIS ASP PLEURAL SPACE W/IMG GUIDE No date: KNEE CARTILAGE SURGERY; Left 12/21/2017: LEFT HEART CATH AND CORONARY ANGIOGRAPHY; N/A     Comment:  Procedure: LEFT HEART CATH AND CORONARY ANGIOGRAPHY;                Surgeon: Mady Bruckner, MD;  Location: [facility] INVASIVE CV               LAB;  Service: Cardiovascular;  Laterality: N/A; No date: LITHOTRIPSY 10/23/2021: REMOVAL OF STONES     Comment:  Procedure: REMOVAL OF STONES;  Surgeon: Aneita Gwendlyn DASEN, MD;  Location: [facility] ENDOSCOPY;  Service:  Endoscopy;; 01/25/2024: RIGHT/LEFT HEART CATH AND CORONARY/GRAFT ANGIOGRAPHY;  Bilateral     Comment:  Procedure: RIGHT/LEFT HEART CATH AND CORONARY/GRAFT               ANGIOGRAPHY;  Surgeon: Mady Bruckner, MD;  Location:               [facility] INVASIVE CV LAB;  Service: Cardiovascular;                Laterality: Bilateral; 10/23/2021: SPHINCTEROTOMY     Comment:  Procedure: SPHINCTEROTOMY;  Surgeon: Aneita Gwendlyn DASEN,               MD;  Location: [facility] ENDOSCOPY;  Service: Endoscopy;; 01/18/2018: TEE WITHOUT CARDIOVERSION; N/A     Comment:  Procedure: TRANSESOPHAGEAL ECHOCARDIOGRAM (TEE);                Surgeon: [name] Ochoa, Maude, MD;  Location: [facility] OR;                Service: Open Heart Surgery;  Laterality: N/A; 10/28/2021: TEE WITHOUT CARDIOVERSION; N/A     Comment:  Procedure: TRANSESOPHAGEAL ECHOCARDIOGRAM (TEE);                Surgeon: Mona Vinie BROCKS, MD;  Location: [facility] ENDOSCOPY;                Service: Cardiovascular;  Laterality: N/A; No date: UMBILICAL HERNIA REPAIR     Comment:  with mesh  BMI    Body Mass Index: 31.03 kg/m      Reproductive/Obstetrics negative OB ROS                              Anesthesia Physical Anesthesia Plan  ASA: 3  Anesthesia Plan: General   Post-op Pain Management: Minimal or no pain anticipated   Induction: Intravenous  PONV Risk Score and Plan: 1 and Propofol  infusion and TIVA  Airway Management Planned: Natural Airway and Nasal Cannula  Additional Equipment:   Intra-op Plan:   Post-operative Plan:   Informed Consent: I have reviewed the patients History and Physical, chart, labs and discussed the procedure including the risks, benefits and alternatives for the proposed anesthesia with the patient or authorized representative who has indicated his/her understanding and acceptance.     Dental Advisory Given  Plan Discussed with: Anesthesiologist, CRNA and Surgeon  Anesthesia Plan Comments: (Patient  consented for risks of anesthesia including but not limited to:  - adverse reactions to medications - damage to eyes, teeth, lips or other oral mucosa - nerve damage due to positioning  -  sore throat or hoarseness - Damage to heart, brain, nerves, lungs, other parts of body or loss of life  Patient voiced understanding and assent.)         Anesthesia Quick Evaluation

## 2024-03-12 NOTE — Anesthesia Postprocedure Evaluation (Signed)
 Anesthesia Post Note  Patient: [name] V Thobe Jr.  Procedure(s) Performed: ECHOCARDIOGRAM, TRANSESOPHAGEAL  Patient location during evaluation: PACU Anesthesia Type: General Level of consciousness: awake and alert Pain management: pain level controlled Vital Signs Assessment: post-procedure vital signs reviewed and stable Respiratory status: spontaneous breathing, nonlabored ventilation, respiratory function stable and patient connected to nasal cannula oxygen Cardiovascular status: blood pressure returned to baseline and stable Postop Assessment: no apparent nausea or vomiting Anesthetic complications: no   No notable events documented.   Last Vitals:  Vitals:   03/12/24 1315 03/12/24 1340  BP: 98/70 108/61  Pulse: 67 83  Resp: 20 20  Temp: 36.7 C 36.6 C  SpO2: 96% 99%    Last Pain:  Vitals:   03/12/24 1340  TempSrc: Oral  PainSc:                  Lendia LITTIE Mae

## 2024-03-12 NOTE — Procedures (Signed)
 Transesophageal Echocardiogram :  Indication: bacteremia Requesting/ordering  physician:   Procedure: 10 ml of lidocaine  were given orally to provide local anesthesia to the oropharynx. The patient was positioned supine on the left side, bite block provided. The patient was sedated per anesthesia team. Using digital technique an omniplane probe was advanced into the esophagus without incident.    See report in EPIC  for complete details: In brief, imaging revealed normal LV function with no RWMAs and no mural apical thrombus.  .  Estimated ejection fraction was 65%.  Right sided cardiac chambers were normal with no evidence of pulmonary hypertension.  Aortic valve calcification. No evidence for vegetation or endocarditis  Imaging of the septum showed no ASD or VSD 2D and color flow confirmed no PFO  The LA was well visualized in orthogonal views.  There was no spontaneous contrast and no thrombus in the LA and LA appendage   The descending thoracic aorta had no  mural aortic debris with no evidence of aneurysmal dilation or disection  Conclusion. No evidence for vegetation or endocarditis   Redell Cave 03/12/2024 12:40 PM

## 2024-03-12 NOTE — Progress Notes (Signed)
 Nutrition Follow Up Note   DOCUMENTATION CODES:   Non-severe (moderate) malnutrition in context of chronic illness, Obesity unspecified  INTERVENTION:   Recommend NGT placement and nutrition support if pt's oral intake remains poor  Ensure Plus High Protein po TID, each supplement provides 350 kcal and 20 grams of protein  Ice cream TID with meals  Liberalize diet   MVI po daily   Vitamin C 500mg  po BID   Pt at high refeed risk; recommend monitor potassium, magnesium  and phosphorus labs daily until stable  Daily weights  NUTRITION DIAGNOSIS:   Moderate Malnutrition related to chronic illness (CHF) as evidenced by mild fat depletion, moderate fat depletion, mild muscle depletion, moderate muscle depletion, energy intake < 75% for > or equal to 1 month. -ongoing   GOAL:   Patient will meet greater than or equal to 90% of their needs -progressing   MONITOR:   PO intake, Supplement acceptance, Labs, Weight trends, I & O's, Skin  ASSESSMENT:   83 y/o male with h/o HLD, CAD s/p CABG x 4, PAF, HTN, DM, neuropathy, GERD, CCC, cholelithiasis with ascending cholangitis s/p robotic assisted laparoscopic cholecystectomy 2023, recurrent liver abscess, CHF, recurrent right pleural effusion, bacteremia with mitral valve endocarditis, CKD III, kidney stones, BPH, diverticulosis and umbilical hernia s/p mesh repair who is admitted with enterococcal bacteremia, liver abscess s/p IR drain 6/27and AKI.  Pt s/p TEE today  Met with pt in room today. Pt reports that he is feeling better but reports that he still feels bad. Pt reports ongoing poor oral intake. Pt reports that he continues to have taste changes and reports that he hates the hospital food. Pt ate the deli meat from his sandwich today and an orange for lunch. Pt did drink an Ensure Max and reports that he has been drinking 1-2 Ensure per day. Pt does not like the Magic Cups and is requesting regular ice cream with meal trays. Pt has  ordered pot roast with rice for dinner. RD discussed with pt the importance of adequate nutrition needed to preserve lean muscle. Discussed the recommendation for NGT placement and nutrition support if patient's oral intake remains poor. Pt would like to try and avoid NGT placement if possible. RD will liberalize pt's diet to encourage increased oral intake. Will continue Ensure supplements. Pt remains at high refeed risk. RD will add vitamins to support wound healing. Per chart, pt is down ~20lbs(9%) since March; this is significant weight loss. Pt reports that he used to weigh over 300lbs.   Medications reviewed and include: B12, fe-fum-vit-c-B12-FA, midodrine, remeron , MVI, unasyn    Labs reviewed: K 4.8 wnl, BUN 29(H) Hgb 9.0(L), Hct 30.7(L), MCH 24.5(L), MCHC 29.3(L)  Drains- 90ml   UOP-314ml   Nutrition Focused Physical Exam:  Flowsheet Row Most Recent Value  Orbital Region Moderate depletion  Upper Arm Region Moderate depletion  Thoracic and Lumbar Region Moderate depletion  Buccal Region Severe depletion  Temple Region Severe depletion  Clavicle Bone Region Severe depletion  Clavicle and Acromion Bone Region Severe depletion  Scapular Bone Region Moderate depletion  Dorsal Hand Moderate depletion  Patellar Region Mild depletion  Anterior Thigh Region Mild depletion  Posterior Calf Region Moderate depletion  Edema (RD Assessment) None  Hair Reviewed  Eyes Reviewed  Mouth Reviewed  Skin Reviewed  Nails Reviewed   Diet Order:   Diet Order             Diet Carb Modified Fluid consistency: Thin; Room service appropriate? Yes  Diet effective now                  EDUCATION NEEDS:   Education needs have been addressed  Skin:  Skin Assessment: Reviewed RN Assessment (Stage II buttock, MASD)  Last BM:  6/30- type 6  Height:   Ht Readings from Last 1 Encounters:  03/07/24 5' 10 (1.778 m)    Weight:   Wt Readings from Last 1 Encounters:  03/07/24 98.1 kg     Ideal Body Weight:  75.5 kg  BMI:  Body mass index is 31.03 kg/m.  Estimated Nutritional Needs:   Kcal:  2100-2400kcal/day  Protein:  105-120g/day  Fluid:  2.0-2.3L/day  Augustin Shams MS, RD, LDN If unable to be reached, please send secure chat to RD inpatient available from 8:00a-4:00p daily

## 2024-03-12 NOTE — Progress Notes (Signed)
Patient returned from TEE.   

## 2024-03-13 ENCOUNTER — Encounter: Payer: Self-pay | Admitting: Cardiology

## 2024-03-13 DIAGNOSIS — K650 Generalized (acute) peritonitis: Secondary | ICD-10-CM | POA: Diagnosis not present

## 2024-03-13 LAB — PHOSPHORUS: Phosphorus: 2.9 mg/dL (ref 2.5–4.6)

## 2024-03-13 LAB — GLUCOSE, CAPILLARY
Glucose-Capillary: 94 mg/dL (ref 70–99)
Glucose-Capillary: 153 mg/dL — ABNORMAL HIGH (ref 70–99)
Glucose-Capillary: 146 mg/dL — ABNORMAL HIGH (ref 70–99)

## 2024-03-13 LAB — MAGNESIUM: Magnesium: 1.8 mg/dL (ref 1.7–2.4)

## 2024-03-13 MED ORDER — SPIRONOLACTONE 25 MG PO TABS
25.0000 mg | ORAL_TABLET | Freq: Every day | ORAL | Status: DC
  Administered 2024-03-13 – 2024-03-17 (×5): 25 mg via ORAL
  Filled 2024-03-13 (×5): qty 1

## 2024-03-13 MED ORDER — TORSEMIDE 20 MG PO TABS
20.0000 mg | ORAL_TABLET | ORAL | Status: DC
  Administered 2024-03-14 – 2024-03-16 (×2): 20 mg via ORAL
  Filled 2024-03-13 (×2): qty 1

## 2024-03-13 MED ORDER — GERHARDT'S BUTT CREAM
TOPICAL_CREAM | Freq: Two times a day (BID) | CUTANEOUS | Status: DC
  Filled 2024-03-13: qty 60

## 2024-03-13 NOTE — Progress Notes (Signed)
 Physical Therapy Treatment Patient Details Name: [name] Young. MRN: [id number] DOB: August 16, 1941 Today's Date: 03/13/2024   History of Present Illness Pt is a 83 y.o. male admitted with perihepatic abscess, s/p CT/US  guided drainage with RUQ drain placement on 6/27, AKI, & protein-calorie malnutrition. PMH significant for CABGx3, CHF, DOE, AFIB on Eliquis , HTN, CKD 3a, DM, endocarditis with Streptococcus bacteremia, & E. coli bacteremia    PT Comments  Pt willing to participate, though initially resistant to doing a lot; wife present and helpful in encouraging participation.  He did endorse a sore bottom and was therefore open to the idea of positional change (aka getting up to the recliner).  Pt was slow and labored with the effort of getting to EOB but did a majority of the effort w/o assist.  He needed considerable assist to rise to standing (slightly elevated surfaces) and was reliant on the walker with poor standing tolerance.  Pt will benefit from continued PT to address functional limitations, continue with POC.   If plan is discharge home, recommend the following: A lot of help with walking and/or transfers;A lot of help with bathing/dressing/bathroom;Assistance with cooking/housework;Assist for transportation;Help with stairs or ramp for entrance;Supervision due to cognitive status   Can travel by private vehicle     No  Equipment Recommendations  None recommended by PT    Recommendations for Other Services       Precautions / Restrictions Precautions Precautions: Fall Recall of Precautions/Restrictions: Impaired Precaution/Restrictions Comments: JP drain RLQ, monitor BP and HR Restrictions Weight Bearing Restrictions Per Provider Order: No     Mobility  Bed Mobility Overal bed mobility: Needs Assistance Bed Mobility: Supine to Sit     Supine to sit: Min assist     General bed mobility comments: Pt was slow and labored with the effort, but showed ability to bridge  hips and shift LEs bit by bit to EOB, heavy but appropriate use of UEs on rails to turn to side and ultimately get up to sitting.  Pt needing only light assist but plenty of extra time and cuing    Transfers Overall transfer level: Needs assistance Equipment used: Rolling walker (2 wheels) Transfers: Sit to/from Stand Sit to Stand: Mod assist, Max assist, +2 physical assistance, From elevated surface           General transfer comment: Multiple standing bouts with need for considerable assist to fully shift hips forward and attain upright over walker.  Elevated bed height to allow for easier transition, pillows on recliner surface for same as well as comfort.    Ambulation/Gait   Gait Distance (Feet): 6 Feet Assistive device: Rolling walker (2 wheels)         General Gait Details: Pt reports fatigue with any prolonged standing but did manage to take a few side steps and transition to recliner on first standing attempt, then quick to fatigue with slow, labored effort of ~10 small steps   Stairs             Wheelchair Mobility     Tilt Bed    Modified Rankin (Stroke Patients Only)       Balance Overall balance assessment: Needs assistance Sitting-balance support: Feet supported, Single extremity supported Sitting balance-Leahy Scale: Good Sitting balance - Comments: c/o soreness on his bottom, but able to maintain balance   Standing balance support: Bilateral upper extremity supported Standing balance-Leahy Scale: Fair Standing balance comment: reliant on RW, quick to fatigue and request to sit  Communication Communication Communication: Impaired Factors Affecting Communication: Hearing impaired  Cognition Arousal: Alert Behavior During Therapy: WFL for tasks assessed/performed                             Following commands: Intact Following commands impaired: Follows one step commands with increased  time    Cueing Cueing Techniques: Verbal cues, Gestural cues, Tactile cues  Exercises      General Comments General comments (skin integrity, edema, etc.): Needing encouragement from wife to fully participate, c/o sore bottom t/o the session      Pertinent Vitals/Pain Pain Assessment Pain Assessment: Faces Faces Pain Scale: Hurts even more Pain Location: bottom    Home Living                          Prior Function            PT Goals (current goals can now be found in the care plan section) Progress towards PT goals: Progressing toward goals    Frequency    Min 2X/week      PT Plan      Co-evaluation              AM-PAC PT 6 Clicks Mobility   Outcome Measure  Help needed turning from your back to your side while in a flat bed without using bedrails?: A Little Help needed moving from lying on your back to sitting on the side of a flat bed without using bedrails?: A Lot Help needed moving to and from a bed to a chair (including a wheelchair)?: A Lot Help needed standing up from a chair using your arms (e.g., wheelchair or bedside chair)?: A Lot Help needed to walk in hospital room?: A Lot Help needed climbing 3-5 steps with a railing? : Total 6 Click Score: 12    End of Session Equipment Utilized During Treatment: Gait belt Activity Tolerance: Patient limited by fatigue;Other (comment) Patient left: with chair alarm set;with call bell/phone within reach Nurse Communication: Mobility status PT Visit Diagnosis: Muscle weakness (generalized) (M62.81);Repeated falls (R29.6);Other abnormalities of gait and mobility (R26.89);Unsteadiness on feet (R26.81)     Time: 1340-1410 PT Time Calculation (min) (ACUTE ONLY): 30 min  Charges:    $Therapeutic Activity: 23-37 mins PT General Charges $$ ACUTE PT VISIT: 1 Visit                     Carmin JONELLE Deed, DPT 03/13/2024, 3:25 PM

## 2024-03-13 NOTE — Plan of Care (Signed)

## 2024-03-13 NOTE — TOC Progression Note (Addendum)
 Transition of Care (TOC) - Progression Note    Patient Details  Name: [name] Young. MRN: [id number] Date of Birth: 05-19-1941  Transition of Care [facility]) CM/SW Contact  Lauraine JAYSON Carpen, LCSW Phone Number: 03/13/2024, 10:28 AM  Clinical Narrative:  Patient and wife have accepted bed offer from [facility]. Patient has been there in the past and it is 6 minutes from their home. Admissions coordinator is aware. Per ID pharmacist, patient might discharge on PO abx. Waiting on final determination on that before starting insurance authorization. Patient will need a private room at the SNF due to isolation precautions.   10:55 am: Patient will discharge on PO abx. SNF is aware. CSW started insurance authorization.  2:14 pm: Auth still pending.  3:46 pm: Per ID note, plan to discharge on Augmentin . The SNF pharmacy won't be able to get that medication in time for him to discharge tomorrow if shara is approved. Will discuss with unit pharmacist in the morning.  Expected Discharge Plan: Home/Self Care Barriers to Discharge: Continued Medical Work up  Expected Discharge Plan and Services     Post Acute Care Choice: Durable Medical Equipment Living arrangements for the past 2 months: Single Family Home                                       Social Determinants of Health (SDOH) Interventions SDOH Screenings   Food Insecurity: Unknown (03/06/2024)  Housing: Low Risk  (03/06/2024)  Transportation Needs: No Transportation Needs (03/06/2024)  Utilities: Not At Risk (03/06/2024)  Depression (PHQ2-9): Low Risk  (12/31/2023)  Financial Resource Strain: Low Risk  (12/15/2023)   Received from [facility]  Physical Activity: Unknown (10/08/2023)  Social Connections: Moderately Isolated (03/06/2024)  Stress: Patient Declined (10/08/2023)  Tobacco Use: Medium Risk (03/09/2024)    Readmission Risk Interventions    03/07/2024    3:03 PM  Readmission Risk  Prevention Plan  Transportation Screening Complete  PCP or Specialist Appt within 3-5 Days Complete  Social Work Consult for Recovery Care Planning/Counseling Complete  Palliative Care Screening Not Applicable  Medication Review Oceanographer) Complete

## 2024-03-13 NOTE — Progress Notes (Signed)
 INFECTIOUS DISEASE PROGRESS NOTE Date of Admission:  03/06/2024     ID: [name] Young. is a 83 y.o. male with  enterococcal bacteremia, liver abscess Principal Problem:   Perihepatic abscess (HCC) Active Problems:   Hyperlipidemia associated with type 2 diabetes mellitus (HCC)   Obesity, Class I, BMI 30-34.9   DNR (do not resuscitate)   S/P CABG x 3   FTT (failure to thrive) in adult   Anemia   PAF (paroxysmal atrial fibrillation) (HCC)   Insomnia   General weakness   Essential hypertension   Hyperkalemia   AKI (acute kidney injury) (HCC)   Malnutrition of moderate degree   Pressure injury of skin   Bacteremia   Subjective: No fevers, trying to eat a little more  ROS  Eleven systems are reviewed and negative except per hpi  Medications:  Antibiotics Given (last 72 hours)     Date/Time Action Medication Dose Rate   03/10/24 1831 New Bag/Given   piperacillin -tazobactam (ZOSYN ) IVPB 3.375 g 3.375 g 12.5 mL/hr   03/11/24 0510 New Bag/Given   piperacillin -tazobactam (ZOSYN ) IVPB 3.375 g 3.375 g 12.5 mL/hr   03/11/24 1339 New Bag/Given   piperacillin -tazobactam (ZOSYN ) IVPB 3.375 g 3.375 g 12.5 mL/hr   03/11/24 2224 New Bag/Given   Ampicillin -Sulbactam (UNASYN ) 3 g in sodium chloride  0.9 % 100 mL IVPB 3 g 200 mL/hr   03/12/24 9375 New Bag/Given   Ampicillin -Sulbactam (UNASYN ) 3 g in sodium chloride  0.9 % 100 mL IVPB 3 g 200 mL/hr   03/12/24 1428 New Bag/Given   Ampicillin -Sulbactam (UNASYN ) 3 g in sodium chloride  0.9 % 100 mL IVPB 3 g 200 mL/hr   03/12/24 2348 New Bag/Given   Ampicillin -Sulbactam (UNASYN ) 3 g in sodium chloride  0.9 % 100 mL IVPB 3 g 200 mL/hr   03/13/24 0515 New Bag/Given   Ampicillin -Sulbactam (UNASYN ) 3 g in sodium chloride  0.9 % 100 mL IVPB 3 g 200 mL/hr   03/13/24 1300 New Bag/Given   Ampicillin -Sulbactam (UNASYN ) 3 g in sodium chloride  0.9 % 100 mL IVPB 3 g 200 mL/hr       apixaban   5 mg Oral BID   vitamin C  500 mg Oral BID    cyanocobalamin   1,000 mcg Oral Q M,W,F   Fe Fum-Vit C-Vit B12-FA  1 capsule Oral QPC breakfast   feeding supplement  237 mL Oral TID BM   Gerhardt's butt cream   Topical BID   midodrine  10 mg Oral TID WC   mirtazapine   30 mg Oral QHS   multivitamin with minerals  1 tablet Oral Daily   rosuvastatin   10 mg Oral QHS   sodium chloride  flush  5 mL Intracatheter Q8H   tamsulosin   0.4 mg Oral QPC supper    Objective: Vital signs in last 24 hours: Temp:  [97.8 F (36.6 C)-98.5 F (36.9 C)] 97.8 F (36.6 C) (07/03 1454) Pulse Rate:  [73-95] 95 (07/03 1454) Resp:  [17-19] 17 (07/03 1454) BP: (102-130)/(53-70) 102/53 (07/03 1454) SpO2:  [97 %-100 %] 100 % (07/03 1454) Weight:  [93.5 kg-94.8 kg] 94.8 kg (07/03 0500) Constitutional: He is frail, chronically ill appearin, nad HENT: anicter Mouth/Throat: Oropharynx is clear and moist. No oropharyngeal exudate.  Cardiovascular: Normal rate, regular rhythm and normal heart sounds. Exam reveals no gallop and no friction rub.  No murmur heard.  Pulmonary/Chest: Effort normal and breath sounds normal. No respiratory distress. He has no wheezes.  Abdominal: mild ttp RUQ, JP drain with bloody drainage Lymphadenopathy:  He has no cervical adenopathy.  Neurological: He is alert and oriented to person, place, and time.  Skin: Skin is warm and dry. No rash noted. No erythema.  Psychiatric: He has a normal mood and affect. His behavior is normal.  Lab Results Recent Labs    03/11/24 0302  WBC 9.9  HGB 9.0*  HCT 30.7*  NA 139  K 4.8  CL 109  CO2 22  BUN 29*  CREATININE 1.00    Microbiology: Results for orders placed or performed during the hospital encounter of 03/06/24  Blood Culture (routine x 2)     Status: Abnormal   Collection Time: 03/06/24 11:56 AM   Specimen: BLOOD LEFT ARM  Result Value Ref Range Status   Specimen Description   Final    BLOOD LEFT ARM Performed at [facility], [address] [place], [place]  [address]    Special Requests   Final    BOTTLES DRAWN AEROBIC AND ANAEROBIC Blood Culture adequate volume Performed at [facility], [address] [place], [place] [address]    Culture  Setup Time   Final    GRAM POSITIVE COCCI ANAEROBIC BOTTLE ONLY CRITICAL RESULT CALLED TO, READ BACK BY AND VERIFIED WITH: ALEX CHAPPELL AT 0802 03/08/24.PMF Performed at [facility], [address] [place], [place] [address]    Culture (A)  Final    ENTEROCOCCUS FAECALIS VANCOMYCIN  RESISTANT ENTEROCOCCUS ISOLATED    Report Status 03/10/2024 FINAL  Final   Organism ID, Bacteria ENTEROCOCCUS FAECALIS  Final      Susceptibility   Enterococcus faecalis - MIC*    AMPICILLIN  <=2 SENSITIVE Sensitive     VANCOMYCIN  >=32 RESISTANT Resistant     GENTAMICIN SYNERGY SENSITIVE Sensitive     * ENTEROCOCCUS FAECALIS  Blood Culture ID Panel (Reflexed)     Status: Abnormal   Collection Time: 03/06/24 11:56 AM  Result Value Ref Range Status   Enterococcus faecalis DETECTED (A) NOT DETECTED Final    Comment: CRITICAL RESULT CALLED TO, READ BACK BY AND VERIFIED WITH: ALEX CHAPPELL AT 0802 03/08/24.PMF    Enterococcus Faecium NOT DETECTED NOT DETECTED Final   Listeria monocytogenes NOT DETECTED NOT DETECTED Final   Staphylococcus species NOT DETECTED NOT DETECTED Final   Staphylococcus aureus (BCID) NOT DETECTED NOT DETECTED Final   Staphylococcus epidermidis NOT DETECTED NOT DETECTED Final   Staphylococcus lugdunensis NOT DETECTED NOT DETECTED Final   Streptococcus species NOT DETECTED NOT DETECTED Final   Streptococcus agalactiae NOT DETECTED NOT DETECTED Final   Streptococcus pneumoniae NOT DETECTED NOT DETECTED Final   Streptococcus pyogenes NOT DETECTED NOT DETECTED Final   A.calcoaceticus-baumannii NOT DETECTED NOT DETECTED Final   Bacteroides fragilis NOT DETECTED NOT DETECTED Final   Enterobacterales NOT DETECTED NOT DETECTED Final   Enterobacter cloacae complex NOT DETECTED NOT DETECTED  Final   Escherichia coli NOT DETECTED NOT DETECTED Final   Klebsiella aerogenes NOT DETECTED NOT DETECTED Final   Klebsiella oxytoca NOT DETECTED NOT DETECTED Final   Klebsiella pneumoniae NOT DETECTED NOT DETECTED Final   Proteus species NOT DETECTED NOT DETECTED Final   Salmonella species NOT DETECTED NOT DETECTED Final   Serratia marcescens NOT DETECTED NOT DETECTED Final   Haemophilus influenzae NOT DETECTED NOT DETECTED Final   Neisseria meningitidis NOT DETECTED NOT DETECTED Final   Pseudomonas aeruginosa NOT DETECTED NOT DETECTED Final   Stenotrophomonas maltophilia NOT DETECTED NOT DETECTED Final   Candida albicans NOT DETECTED NOT DETECTED Final   Candida auris  NOT DETECTED NOT DETECTED Final   Candida glabrata NOT DETECTED NOT DETECTED Final   Candida krusei NOT DETECTED NOT DETECTED Final   Candida parapsilosis NOT DETECTED NOT DETECTED Final   Candida tropicalis NOT DETECTED NOT DETECTED Final   Cryptococcus neoformans/gattii NOT DETECTED NOT DETECTED Final   Vancomycin  resistance DETECTED (A) NOT DETECTED Final    Comment: CRITICAL RESULT CALLED TO, READ BACK BY AND VERIFIED WITH: ALEX CHAPPELL AT 0802 03/08/24.PMF Performed at [facility], [address] [place], [place] [address]   Blood Culture (routine x 2)     Status: None   Collection Time: 03/06/24  2:25 PM   Specimen: BLOOD  Result Value Ref Range Status   Specimen Description BLOOD BLOOD LEFT HAND [facility]  Final   Special Requests   Final    BOTTLES DRAWN AEROBIC AND ANAEROBIC Blood Culture adequate volume   Culture   Final    NO GROWTH 5 DAYS Performed at [facility], [address] [place], [place] [address]    Report Status 03/11/2024 FINAL  Final  Aerobic/Anaerobic Culture w Gram Stain (surgical/deep wound)     Status: None   Collection Time: 03/07/24  4:00 PM   Specimen: Abscess  Result Value Ref Range Status   Specimen Description   Final    ABSCESS Performed at [facility], [address], [place], [place] [address]    Special Requests   Final    NONE Performed at [facility], [address] [place], [place] [address]    Gram Stain   Final    MODERATE WBC PRESENT, PREDOMINANTLY PMN MODERATE GRAM POSITIVE COCCI RARE GRAM NEGATIVE RODS Performed at [facility], [address] [place], [place] [address]    Culture   Final    ABUNDANT ENTEROCOCCUS FAECALIS ABUNDANT ESCHERICHIA COLI ABUNDANT PROTEUS MIRABILIS NO ANAEROBES ISOLATED VANCOMYCIN  RESISTANT ENTEROCOCCUS ISOLATED    Report Status 03/12/2024 FINAL  Final   Organism ID, Bacteria ESCHERICHIA COLI  Final   Organism ID, Bacteria PROTEUS MIRABILIS  Final   Organism ID, Bacteria ENTEROCOCCUS FAECALIS  Final      Susceptibility   Escherichia coli - MIC*    AMPICILLIN  <=2 SENSITIVE Sensitive     CEFEPIME  <=0.12 SENSITIVE Sensitive     CEFTAZIDIME  <=1 SENSITIVE Sensitive     CEFTRIAXONE  <=0.25 SENSITIVE Sensitive     CIPROFLOXACIN  <=0.25 SENSITIVE Sensitive     GENTAMICIN <=1 SENSITIVE Sensitive     IMIPENEM <=0.25 SENSITIVE Sensitive     TRIMETH/SULFA <=20 SENSITIVE Sensitive     AMPICILLIN /SULBACTAM <=2 SENSITIVE Sensitive     PIP/TAZO <=4 SENSITIVE Sensitive ug/mL    * ABUNDANT ESCHERICHIA COLI   Enterococcus faecalis - MIC*    AMPICILLIN  <=2 SENSITIVE Sensitive     VANCOMYCIN  >=32 RESISTANT Resistant     GENTAMICIN SYNERGY SENSITIVE Sensitive     * ABUNDANT ENTEROCOCCUS FAECALIS   Proteus mirabilis - MIC*    AMPICILLIN  <=2 SENSITIVE Sensitive     CEFEPIME  <=0.12 SENSITIVE Sensitive     CEFTAZIDIME  <=1 SENSITIVE Sensitive     CEFTRIAXONE  <=0.25 SENSITIVE Sensitive     CIPROFLOXACIN  <=0.25 SENSITIVE Sensitive     GENTAMICIN <=1 SENSITIVE Sensitive     IMIPENEM <=0.25 SENSITIVE Sensitive     TRIMETH/SULFA <=20 SENSITIVE Sensitive     AMPICILLIN /SULBACTAM <=2 SENSITIVE Sensitive     PIP/TAZO <=4 SENSITIVE Sensitive ug/mL    * ABUNDANT PROTEUS MIRABILIS   Culture, blood (single) w Reflex  to ID Panel     Status: None (Preliminary result)   Collection Time: 03/10/24  5:29 PM   Specimen: BLOOD  Result Value Ref Range Status   Specimen Description BLOOD BLOOD LEFT ARM BLA  Final   Special Requests   Final    BOTTLES DRAWN AEROBIC AND ANAEROBIC Blood Culture adequate volume   Culture   Final    NO GROWTH 3 DAYS Performed at [facility], [address] [place], [place] [address]    Report Status PENDING  Incomplete    Studies/Results: ECHO TEE Result Date: 03/12/2024    TRANSESOPHOGEAL ECHO REPORT   Patient Name:   [name] Dai. Date of Exam: 03/12/2024 Medical Rec #:  [id number]            Height:       70.0 in Accession #:    [id number]           Weight:       216.3 lb Date of Birth:  [birth year]           BSA:          2.158 m Patient Age:    82 years             BP:           104/63 mmHg Patient Gender: M                    HR:           89 bpm. Exam Location:  [facility] Procedure: Transesophageal Echo, Color Doppler and Cardiac Doppler (Both            Spectral and Color Flow Doppler were utilized during procedure). Indications:     Bacteremia R78.81  History:         Patient has prior history of Echocardiogram examinations, most                  recent 03/11/2024. CAD, Prior CABG; Signs/Symptoms:Bacteremia.  Sonographer:     Ashley McNeely-Sloane Referring Phys:  [id number] [facility] L CAREY Diagnosing Phys: Redell Cave MD PROCEDURE: The transesophogeal probe was passed without difficulty through the esophogus of the patient. Sedation performed by different physician. The patient developed no complications during the procedure.  IMPRESSIONS  1. Left ventricular ejection fraction, by estimation, is 55 to 60%. The left ventricle has normal function.  2. Right ventricular systolic function is normal. The right ventricular size is normal.  3. No left atrial/left atrial appendage thrombus was detected.  4. The mitral valve is normal in structure.  Mild mitral valve regurgitation.  5. The aortic valve is tricuspid. Aortic valve regurgitation is mild. Aortic valve sclerosis/calcification is present, without any evidence of aortic stenosis. Conclusion(s)/Recommendation(s): No evidence of vegetation/infective endocarditis on this transesophageael echocardiogram. FINDINGS  Left Ventricle: Left ventricular ejection fraction, by estimation, is 55 to 60%. The left ventricle has normal function. The left ventricular internal cavity size was normal in size. Right Ventricle: The right ventricular size is normal. No increase in right ventricular wall thickness. Right ventricular systolic function is normal. Left Atrium: Left atrial size was normal in size. No left atrial/left atrial appendage thrombus was detected. Right Atrium: Right atrial size was normal in size. Pericardium: There is no evidence of pericardial effusion. Mitral Valve: The mitral valve is normal in structure. Mild mitral valve regurgitation. Tricuspid Valve: The tricuspid valve is normal in structure. Tricuspid valve regurgitation is mild. Aortic Valve: The aortic  valve is tricuspid. Aortic valve regurgitation is mild. Aortic valve sclerosis/calcification is present, without any evidence of aortic stenosis. Pulmonic Valve: The pulmonic valve was normal in structure. Pulmonic valve regurgitation is trivial. Aorta: The aortic root is normal in size and structure. IAS/Shunts: No atrial level shunt detected by color flow Doppler. Redell Cave MD Electronically signed by Redell Cave MD Signature Date/Time: 03/12/2024/2:49:23 PM    Final     Assessment/Plan: [name] Tassinari. is a 83 y.o. male with a complicated medical history including A-fib, hypertension, chronic kidney disease, diabetes, prior history of streptococcal endocarditis, history of E. coli bacteremia, admitted with findings of perihepatic abscess on outpatient imaging.  He had a complicated course over the last few months and  had recurrent pleural effusions requiring multiple thoracentesis complicated by right hemothorax and acute blood loss anemia in March.  He had been having some increasing abdominal pain at home and CT scan of his chest abdomen and pelvis was performed June 16.  This showed removal of right-sided pleural drain decreased size of loculated right pleural effusion decreased size of right pleural mass but a new subcapsular fluid collection adjacent to the inferior right hepatic lobe measuring up to 7.5 cm.  There is also intramuscular wall thickening along the right 11th and 12th ribs.  Patient underwent IR CT-guided aspiration of the right lateral abdominal wall right perihepatic fluid collection with 450 mL of bloody purulent fluid removed.  Gram stain showed moderate gram-positive cocci and rare gram-negative rods. Since admission his blood cultures have turned positive for Enterococcus which is vancomycin  resistant but sensitive to ampicillin .  No follow-up blood cultures have been done. Since admission he was on cefepime  and metronidazole  and daptomycin was started June 28. Drain cx pending.     7/1- improving. Changed ot zosyn . FU BCX 6/30 ngtd.  7/2  tee neg,  fu bcx neg  Recommendations Enterococcal bacteremia- amp S. Source liver abscess. Effective treatment started 6/28 with dapto since VRE (but amp S). Day 6 of treatment.  Repeat Bcx 6/30 ngtd TEE neg endocarditis Can continue IV unasyn  until dc then change to augmentin  as below   Liver abscess- s/p drain. GS mixed enterococcus, E coli and  and proteus  Consolidated to unasyn   Will be able to dc on augmentin  875 bid- would continue until repeat CT scan is done and drain removed and possibly for a week after.  IR to arrange Otpt drain clinic and CT fu  FU in ID clinic in 2 weeks Thank you very much for the consult. Will follow with you.  [name] Young   03/13/2024, 3:02 PM

## 2024-03-13 NOTE — Progress Notes (Signed)
 Progress Note    [name]  FMW:[id number] DOB: 05-15-1941  DOA: 03/06/2024 PCP: Rilla Baller, MD      Brief Narrative:    Medical records reviewed and are as summarized below:   Mr. Edwards Mckelvie is a 83 year old male with history of atrial fibrillation on Eliquis , hypertension, chronic HFpEF, CAD s/p CABG x 3, CKD 3a, non-insulin -dependent diabetes mellitus, history of endocarditis with Streptococcus bacteremia, history of E. coli bacteremia, who presented to the ED for chief concerns of perihepatic abscess seen on outpatient imaging.  He had been experiencing right flank and upper back pain for several weeks.  Vitals in the ED showed t of 98.5, respiration rate of 23, heart rate 74, blood pressure 101/47, SpO2 100% on room air.  Serum sodium is 136, potassium 5.4, chloride 100, bicarb 24, BUN of 63, serum creatinine 1.82, EGFR 37, nonfasting blood glucose 176, WBC 12.6, hemoglobin 8.3, platelets of 432. Initial lactic acid was 2.6 and on repeat is 2.9.  Blood cultures were drawn.  UA was not consistent with UTI.  ED treatment: Cefepime  2 g IV, Flagyl  500 mg IV one-time doses, sodium chloride  500 mg bolus x 2.   He was found to have AKI, perihepatic abscess and Enterococcus faecalis bacteremia.  He was treated with empiric IV antibiotics.  He developed hypotension and acute blood loss anemia.  He was treated with IV fluids and was transfused with 3 units of packed red blood cells.      Assessment/Plan:   Principal Problem:   Perihepatic abscess (HCC) Active Problems:   AKI (acute kidney injury) (HCC)   Hyperkalemia   Anemia   PAF (paroxysmal atrial fibrillation) (HCC)   S/P CABG x 3   Essential hypertension   Hyperlipidemia associated with type 2 diabetes mellitus (HCC)   FTT (failure to thrive) in adult   General weakness   Obesity, Class I, BMI 30-34.9   DNR (do not resuscitate)   Insomnia   Malnutrition of moderate degree   Pressure  injury of skin   Bacteremia   Nutrition Problem: Moderate Malnutrition Etiology: chronic illness (CHF)  Signs/Symptoms: mild fat depletion, moderate fat depletion, mild muscle depletion, moderate muscle depletion, energy intake < 75% for > or equal to 1 month   Body mass index is 29.99 kg/m.  (Obesity)  Perihepatic abscess, Enterococcus faecalis bacteremia: S/p image guided drainage of abdominal wall abscess and hematoma on 03/07/2024.  Repeat CT abdomen shows improvement in fluid collection.  TEE on 03/12/2024 did not show any evidence of vegetations or endocarditis. Initially treated with broad-spectrum antibiotics (IV cefepime , IV metronidazole ).  Antibiotics de-escalated to IV daptomycin then transitioned to Zosyn  and subsequently to Unasyn .  Plan to transition to Augmentin  on discharge. IR will reevaluate abdominal drain in 1 to 2 weeks Follow-up with ID specialist in 2 weeks   AKI: Improved.  Restart torsemide  spironolactone Hyperkalemia: Improved.   Hypotension: Improved.  Continue midodrine   Acute blood loss anemia: Hemoglobin improved.  S/p transfusion with 3 units of PRBCs.   Chronic HFpEF: Compensated.  Restart torsemide  and spironolactone. Paroxysmal atrial fibrillation, CAD s/p CABG x 3: continue Eliquis    General Weakness: PT and OT recommending discharge to SNF.   Comorbidities include moderate malnutrition related to chronic illness, hyperlipidemia, type 2 diabetes mellitus, hypertension    Diet Order             Diet Carb Modified Fluid consistency: Thin; Room service appropriate? Yes  Diet effective now  Consultants:  ID specialist Intervention radiologist  Procedures: CT and ultrasound-guided drainage of abdominal wall abscess/infected hematoma on 03/07/2024 TEE on 03/12/2024    Medications:    apixaban   5 mg Oral BID   vitamin C  500 mg Oral BID   cyanocobalamin   1,000 mcg Oral Q M,W,F   Fe Fum-Vit  C-Vit B12-FA  1 capsule Oral QPC breakfast   feeding supplement  237 mL Oral TID BM   Gerhardt's butt cream   Topical BID   midodrine  10 mg Oral TID WC   mirtazapine   30 mg Oral QHS   multivitamin with minerals  1 tablet Oral Daily   rosuvastatin   10 mg Oral QHS   sodium chloride  flush  5 mL Intracatheter Q8H   tamsulosin   0.4 mg Oral QPC supper   Continuous Infusions:  ampicillin -sulbactam (UNASYN ) IV 3 g (03/13/24 1300)     Anti-infectives (From admission, onward)    Start     Dose/Rate Route Frequency Ordered Stop   03/11/24 2200  Ampicillin -Sulbactam (UNASYN ) 3 g in sodium chloride  0.9 % 100 mL IVPB        3 g 200 mL/hr over 30 Minutes Intravenous Every 6 hours 03/11/24 1619     03/10/24 1800  piperacillin -tazobactam (ZOSYN ) IVPB 3.375 g        3.375 g 12.5 mL/hr over 240 Minutes Intravenous Every 8 hours 03/10/24 1649 03/11/24 1739   03/08/24 1100  DAPTOmycin (CUBICIN) 1,000 mg in sodium chloride  0.9 % IVPB  Status:  Discontinued        1,000 mg 140 mL/hr over 30 Minutes Intravenous Daily 03/08/24 0825 03/10/24 1649   03/07/24 0100  ceFEPIme  (MAXIPIME ) 2 g in sodium chloride  0.9 % 100 mL IVPB  Status:  Discontinued        2 g 200 mL/hr over 30 Minutes Intravenous Every 12 hours 03/06/24 1458 03/10/24 1649   03/06/24 2000  metroNIDAZOLE  (FLAGYL ) IVPB 500 mg  Status:  Discontinued        500 mg 100 mL/hr over 60 Minutes Intravenous Every 12 hours 03/06/24 1441 03/10/24 1649   03/06/24 1300  ceFEPIme  (MAXIPIME ) 2 g in sodium chloride  0.9 % 100 mL IVPB        2 g 200 mL/hr over 30 Minutes Intravenous  Once 03/06/24 1245 03/06/24 1410   03/06/24 1300  metroNIDAZOLE  (FLAGYL ) IVPB 500 mg        500 mg 100 mL/hr over 60 Minutes Intravenous  Once 03/06/24 1245 03/06/24 1622              Family Communication/Anticipated D/C date and plan/Code Status   DVT prophylaxis: Place TED hose Start: 03/06/24 1440 apixaban  (ELIQUIS ) tablet 5 mg     Code Status: Full  Code  Family Communication: Plan discussed with his wife at the bedside Disposition Plan: Plan to discharge home   Status is: Inpatient Remains inpatient appropriate because: Perihepatic abscess and bacteremia       Subjective:   Interval events noted.  No new complaints.  He still has some pain on the right side of the abdomen around abdominal drain site.  His wife is at the bedside.  [place], RN, was also at the bedside.  Objective:    Vitals:   03/13/24 0345 03/13/24 0500 03/13/24 0816 03/13/24 1454  BP: 130/70  113/60 (!) 102/53  Pulse: 73  75 95  Resp: 18  19 17   Temp: 98.5 F (36.9 C)  97.9 F (36.6 C) 97.8 F (  36.6 C)  TempSrc:    Oral  SpO2: 97%  100% 100%  Weight:  94.8 kg    Height:       No data found.   Intake/Output Summary (Last 24 hours) at 03/13/2024 1533 Last data filed at 03/13/2024 0700 Gross per 24 hour  Intake 300 ml  Output 535 ml  Net -235 ml   Filed Weights   03/07/24 1445 03/12/24 1538 03/13/24 0500  Weight: 98.1 kg 93.5 kg 94.8 kg    Exam:  GEN: NAD SKIN: Warm and dry EYES: No pallor or icterus ENT: MMM CV: RRR PULM: CTA B ABD: soft, obese, NT, +BS, + abdominal drain with bloody fluid CNS: AAO x 3, non focal EXT: No edema or tenderness       Data Reviewed:   I have personally reviewed following labs and imaging studies:  Labs: Labs show the following:   Basic Metabolic Panel: Recent Labs  Lab 03/07/24 0508 03/07/24 0921 03/07/24 1156 03/08/24 0455 03/09/24 0818 03/11/24 0302 03/13/24 0436  NA 138 138  --  140 140 139  --   K 5.5* 5.0   < > 5.0 5.1 4.8  --   CL 107 108  --  109 111 109  --   CO2 24 24  --  24 24 22   --   GLUCOSE 138* 128*  --  112* 106* 111*  --   BUN 58* 59*  --  54* 40* 29*  --   CREATININE 1.62* 1.54*  --  1.41* 1.17 1.00  --   CALCIUM  7.9* 7.7*  --  8.0* 8.1* 8.4*  --   MG  --   --   --   --   --   --  1.8  PHOS  --   --   --   --   --   --  2.9   < > = values in this interval not  displayed.   GFR Estimated Creatinine Clearance: 66.9 mL/min (by C-G formula based on SCr of 1 mg/dL). Liver Function Tests: Recent Labs  Lab 03/08/24 0455  AST 18  ALT 20  ALKPHOS 76  BILITOT 0.7  PROT 5.7*  ALBUMIN  1.6*   No results for input(s): LIPASE, AMYLASE in the last 168 hours. No results for input(s): AMMONIA in the last 168 hours. Coagulation profile Recent Labs  Lab 03/07/24 0508  INR 1.4*    CBC: Recent Labs  Lab 03/07/24 0508 03/07/24 1702 03/08/24 0455 03/08/24 1659 03/09/24 0818 03/11/24 0302  WBC 10.4  --  7.3  --  8.1 9.9  HGB 7.1* 7.9* 7.4* 8.7* 8.8* 9.0*  HCT 24.0* 26.3* 24.9* 28.7* 29.5* 30.7*  MCV 82.2  --  81.6  --  81.9 83.4  PLT 326  --  309  --  329 323   Cardiac Enzymes: Recent Labs  Lab 03/08/24 0455  CKTOTAL 19*   BNP (last 3 results) Recent Labs    04/25/23 1516  PROBNP 142.0*   CBG: Recent Labs  Lab 03/12/24 2054 03/13/24 0810 03/13/24 1207  GLUCAP 155* 94 153*   D-Dimer: No results for input(s): DDIMER in the last 72 hours. Hgb A1c: No results for input(s): HGBA1C in the last 72 hours. Lipid Profile: No results for input(s): CHOL, HDL, LDLCALC, TRIG, CHOLHDL, LDLDIRECT in the last 72 hours. Thyroid  function studies: No results for input(s): TSH, T4TOTAL, T3FREE, THYROIDAB in the last 72 hours.  Invalid input(s): FREET3 Anemia work up: No results  for input(s): VITAMINB12, FOLATE, FERRITIN, TIBC, IRON, RETICCTPCT in the last 72 hours. Sepsis Labs: Recent Labs  Lab 03/06/24 1739 03/07/24 0508 03/08/24 0455 03/09/24 0818 03/11/24 0302  WBC  --  10.4 7.3 8.1 9.9  LATICACIDVEN 1.5  --   --   --   --     Microbiology Recent Results (from the past 240 hours)  Blood Culture (routine x 2)     Status: Abnormal   Collection Time: 03/06/24 11:56 AM   Specimen: BLOOD LEFT ARM  Result Value Ref Range Status   Specimen Description   Final    BLOOD LEFT ARM Performed  at [facility], [address] [place], [place] [address]    Special Requests   Final    BOTTLES DRAWN AEROBIC AND ANAEROBIC Blood Culture adequate volume Performed at [facility], [address] [place], [place] [address]    Culture  Setup Time   Final    GRAM POSITIVE COCCI ANAEROBIC BOTTLE ONLY CRITICAL RESULT CALLED TO, READ BACK BY AND VERIFIED WITH: ALEX CHAPPELL AT 0802 03/08/24.PMF Performed at [facility], [address], [place], [place] [address]    Culture (A)  Final    ENTEROCOCCUS FAECALIS VANCOMYCIN  RESISTANT ENTEROCOCCUS ISOLATED    Report Status 03/10/2024 FINAL  Final   Organism ID, Bacteria ENTEROCOCCUS FAECALIS  Final      Susceptibility   Enterococcus faecalis - MIC*    AMPICILLIN  <=2 SENSITIVE Sensitive     VANCOMYCIN  >=32 RESISTANT Resistant     GENTAMICIN SYNERGY SENSITIVE Sensitive     * ENTEROCOCCUS FAECALIS  Blood Culture ID Panel (Reflexed)     Status: Abnormal   Collection Time: 03/06/24 11:56 AM  Result Value Ref Range Status   Enterococcus faecalis DETECTED (A) NOT DETECTED Final    Comment: CRITICAL RESULT CALLED TO, READ BACK BY AND VERIFIED WITH: ALEX CHAPPELL AT 0802 03/08/24.PMF    Enterococcus Faecium NOT DETECTED NOT DETECTED Final   Listeria monocytogenes NOT DETECTED NOT DETECTED Final   Staphylococcus species NOT DETECTED NOT DETECTED Final   Staphylococcus aureus (BCID) NOT DETECTED NOT DETECTED Final   Staphylococcus epidermidis NOT DETECTED NOT DETECTED Final   Staphylococcus lugdunensis NOT DETECTED NOT DETECTED Final   Streptococcus species NOT DETECTED NOT DETECTED Final   Streptococcus agalactiae NOT DETECTED NOT DETECTED Final   Streptococcus pneumoniae NOT DETECTED NOT DETECTED Final   Streptococcus pyogenes NOT DETECTED NOT DETECTED Final   A.calcoaceticus-baumannii NOT DETECTED NOT DETECTED Final   Bacteroides fragilis NOT DETECTED NOT DETECTED Final   Enterobacterales NOT DETECTED NOT DETECTED  Final   Enterobacter cloacae complex NOT DETECTED NOT DETECTED Final   Escherichia coli NOT DETECTED NOT DETECTED Final   Klebsiella aerogenes NOT DETECTED NOT DETECTED Final   Klebsiella oxytoca NOT DETECTED NOT DETECTED Final   Klebsiella pneumoniae NOT DETECTED NOT DETECTED Final   Proteus species NOT DETECTED NOT DETECTED Final   Salmonella species NOT DETECTED NOT DETECTED Final   Serratia marcescens NOT DETECTED NOT DETECTED Final   Haemophilus influenzae NOT DETECTED NOT DETECTED Final   Neisseria meningitidis NOT DETECTED NOT DETECTED Final   Pseudomonas aeruginosa NOT DETECTED NOT DETECTED Final   Stenotrophomonas maltophilia NOT DETECTED NOT DETECTED Final   Candida albicans NOT DETECTED NOT DETECTED Final   Candida auris NOT DETECTED NOT DETECTED Final   Candida glabrata NOT DETECTED NOT DETECTED Final   Candida krusei NOT DETECTED NOT DETECTED Final   Candida parapsilosis NOT DETECTED NOT DETECTED  Final   Candida tropicalis NOT DETECTED NOT DETECTED Final   Cryptococcus neoformans/gattii NOT DETECTED NOT DETECTED Final   Vancomycin  resistance DETECTED (A) NOT DETECTED Final    Comment: CRITICAL RESULT CALLED TO, READ BACK BY AND VERIFIED WITH: ALEX CHAPPELL AT 0802 03/08/24.PMF Performed at [facility], [address] [place], [place] [address]   Blood Culture (routine x 2)     Status: None   Collection Time: 03/06/24  2:25 PM   Specimen: BLOOD  Result Value Ref Range Status   Specimen Description BLOOD BLOOD LEFT HAND [facility]  Final   Special Requests   Final    BOTTLES DRAWN AEROBIC AND ANAEROBIC Blood Culture adequate volume   Culture   Final    NO GROWTH 5 DAYS Performed at [facility], [address] [place], [place] [address]    Report Status 03/11/2024 FINAL  Final  Aerobic/Anaerobic Culture w Gram Stain (surgical/deep wound)     Status: None   Collection Time: 03/07/24  4:00 PM   Specimen: Abscess  Result Value Ref Range Status    Specimen Description   Final    ABSCESS Performed at [facility], [address], [place], [place] [address]    Special Requests   Final    NONE Performed at [facility], [address] [place], [place] [address]    Gram Stain   Final    MODERATE WBC PRESENT, PREDOMINANTLY PMN MODERATE GRAM POSITIVE COCCI RARE GRAM NEGATIVE RODS Performed at [facility], [address], [place], [place] [address]    Culture   Final    ABUNDANT ENTEROCOCCUS FAECALIS ABUNDANT ESCHERICHIA COLI ABUNDANT PROTEUS MIRABILIS NO ANAEROBES ISOLATED VANCOMYCIN  RESISTANT ENTEROCOCCUS ISOLATED    Report Status 03/12/2024 FINAL  Final   Organism ID, Bacteria ESCHERICHIA COLI  Final   Organism ID, Bacteria PROTEUS MIRABILIS  Final   Organism ID, Bacteria ENTEROCOCCUS FAECALIS  Final      Susceptibility   Escherichia coli - MIC*    AMPICILLIN  <=2 SENSITIVE Sensitive     CEFEPIME  <=0.12 SENSITIVE Sensitive     CEFTAZIDIME  <=1 SENSITIVE Sensitive     CEFTRIAXONE  <=0.25 SENSITIVE Sensitive     CIPROFLOXACIN  <=0.25 SENSITIVE Sensitive     GENTAMICIN <=1 SENSITIVE Sensitive     IMIPENEM <=0.25 SENSITIVE Sensitive     TRIMETH/SULFA <=20 SENSITIVE Sensitive     AMPICILLIN /SULBACTAM <=2 SENSITIVE Sensitive     PIP/TAZO <=4 SENSITIVE Sensitive ug/mL    * ABUNDANT ESCHERICHIA COLI   Enterococcus faecalis - MIC*    AMPICILLIN  <=2 SENSITIVE Sensitive     VANCOMYCIN  >=32 RESISTANT Resistant     GENTAMICIN SYNERGY SENSITIVE Sensitive     * ABUNDANT ENTEROCOCCUS FAECALIS   Proteus mirabilis - MIC*    AMPICILLIN  <=2 SENSITIVE Sensitive     CEFEPIME  <=0.12 SENSITIVE Sensitive     CEFTAZIDIME  <=1 SENSITIVE Sensitive     CEFTRIAXONE  <=0.25 SENSITIVE Sensitive     CIPROFLOXACIN  <=0.25 SENSITIVE Sensitive     GENTAMICIN <=1 SENSITIVE Sensitive     IMIPENEM <=0.25 SENSITIVE Sensitive     TRIMETH/SULFA <=20 SENSITIVE Sensitive     AMPICILLIN /SULBACTAM <=2 SENSITIVE Sensitive     PIP/TAZO  <=4 SENSITIVE Sensitive ug/mL    * ABUNDANT PROTEUS MIRABILIS  Culture, blood (single) w Reflex to ID Panel     Status: None (Preliminary result)   Collection Time: 03/10/24  5:29 PM   Specimen: BLOOD  Result Value Ref Range Status  Specimen Description BLOOD BLOOD LEFT ARM BLA  Final   Special Requests   Final    BOTTLES DRAWN AEROBIC AND ANAEROBIC Blood Culture adequate volume   Culture   Final    NO GROWTH 3 DAYS Performed at [facility], [address], [place], [place] [address]    Report Status PENDING  Incomplete    Procedures and diagnostic studies:  ECHO TEE Result Date: 03/12/2024    TRANSESOPHOGEAL ECHO REPORT   Patient Name:   Pistol Kessenich. Date of Exam: 03/12/2024 Medical Rec #:  [id number]            Height:       70.0 in Accession #:    [id number]           Weight:       216.3 lb Date of Birth:  May 12, 1941           BSA:          2.158 m Patient Age:    82 years             BP:           104/63 mmHg Patient Gender: M                    HR:           89 bpm. Exam Location:  [facility] Procedure: Transesophageal Echo, Color Doppler and Cardiac Doppler (Both            Spectral and Color Flow Doppler were utilized during procedure). Indications:     Bacteremia R78.81  History:         Patient has prior history of Echocardiogram examinations, most                  recent 03/11/2024. CAD, Prior CABG; Signs/Symptoms:Bacteremia.  Sonographer:     Ashley McNeely-Sloane Referring Phys:  [id number] [facility] L CAREY Diagnosing Phys: Redell Cave MD PROCEDURE: The transesophogeal probe was passed without difficulty through the esophogus of the patient. Sedation performed by different physician. The patient developed no complications during the procedure.  IMPRESSIONS  1. Left ventricular ejection fraction, by estimation, is 55 to 60%. The left ventricle has normal function.  2. Right ventricular systolic function is normal. The right ventricular size is normal.  3. No left atrial/left  atrial appendage thrombus was detected.  4. The mitral valve is normal in structure. Mild mitral valve regurgitation.  5. The aortic valve is tricuspid. Aortic valve regurgitation is mild. Aortic valve sclerosis/calcification is present, without any evidence of aortic stenosis. Conclusion(s)/Recommendation(s): No evidence of vegetation/infective endocarditis on this transesophageael echocardiogram. FINDINGS  Left Ventricle: Left ventricular ejection fraction, by estimation, is 55 to 60%. The left ventricle has normal function. The left ventricular internal cavity size was normal in size. Right Ventricle: The right ventricular size is normal. No increase in right ventricular wall thickness. Right ventricular systolic function is normal. Left Atrium: Left atrial size was normal in size. No left atrial/left atrial appendage thrombus was detected. Right Atrium: Right atrial size was normal in size. Pericardium: There is no evidence of pericardial effusion. Mitral Valve: The mitral valve is normal in structure. Mild mitral valve regurgitation. Tricuspid Valve: The tricuspid valve is normal in structure. Tricuspid valve regurgitation is mild. Aortic Valve: The aortic valve is tricuspid. Aortic valve regurgitation is mild. Aortic valve sclerosis/calcification is present, without any evidence of aortic stenosis. Pulmonic Valve: The pulmonic valve was normal in  structure. Pulmonic valve regurgitation is trivial. Aorta: The aortic root is normal in size and structure. IAS/Shunts: No atrial level shunt detected by color flow Doppler. Redell Cave MD Electronically signed by Redell Cave MD Signature Date/Time: 03/12/2024/2:49:23 PM    Final                LOS: 7 days   AIDA CHO  [facility]   Pager on [email or web address]. If 7PM-7AM, please contact night-coverage at [email or web address]     03/13/2024, 3:33 PM

## 2024-03-13 NOTE — Progress Notes (Signed)
 Occupational Therapy Treatment Patient Details Name: [name] Young. MRN: [id number] DOB: 10/31/1940 Today's Date: 03/13/2024   History of present illness Pt is a 83 y.o. male admitted with perihepatic abscess, s/p CT/US  guided drainage with RUQ drain placement on 6/27, AKI, & protein-calorie malnutrition. PMH significant for CABGx3, CHF, DOE, AFIB on Eliquis , HTN, CKD 3a, DM, endocarditis with Streptococcus bacteremia, & E. coli bacteremia   OT comments  Pt seen for OT tx this date, spouse present throughout and provides encouragement during session. Pt grossly weak and deconditioned, increased time needed for all transitions and frequent rest breaks are required. MIN A for bed mobility, noted to have incontinent BM, +2 MOD A to stand from elevated bed height using RW (NT in room to assist with spouse providing pericare). Pt with limited tolerance to activity, is able to take a few small sidesteps, but needs rest break prior to second standing attempt. Pt educated on importance of mobility for recovery and sitting EOB/chair position at various times throughout the day. Pt too fatigued to attempt further mobility, placed in chair position end of session. VSS on RA, monitored throughout session. Discharge recommendation appropriate, OT will continue to follow.       If plan is discharge home, recommend the following:  A lot of help with walking and/or transfers;A lot of help with bathing/dressing/bathroom;Help with stairs or ramp for entrance;Assist for transportation;Direct supervision/assist for financial management;Direct supervision/assist for medications management;Supervision due to cognitive status;Assistance with cooking/housework   Equipment Recommendations  Other (comment)       Precautions / Restrictions Precautions Precautions: Fall Recall of Precautions/Restrictions: Impaired Precaution/Restrictions Comments: JP drain RLQ, monitor BP and HR Restrictions Weight Bearing  Restrictions Per Provider Order: No       Mobility Bed Mobility Overal bed mobility: Needs Assistance Bed Mobility: Supine to Sit, Sit to Supine     Supine to sit: Min assist Sit to supine: Mod assist   General bed mobility comments: MIN A to transition towards seated EOB, MOD A for BLE returning to supine. pt declines transfer to chair.    Transfers Overall transfer level: Needs assistance Equipment used: Rolling walker (2 wheels) Transfers: Sit to/from Stand Sit to Stand: Mod assist, +2 safety/equipment, +2 physical assistance           General transfer comment: 2x standing bouts. pt requires cues for foot placement and hand placement. once standing, he is able to take a 2-3 sidesteps but fatigues quickly. seated rest break before second standing attempt to take 1 more side step     Balance Overall balance assessment: Needs assistance Sitting-balance support: Feet supported, Single extremity supported Sitting balance-Leahy Scale: Fair Sitting balance - Comments: steady sitting EOB   Standing balance support: Bilateral upper extremity supported Standing balance-Leahy Scale: Fair Standing balance comment: reliant on RW for standing balance                           ADL either performed or assessed with clinical judgement   ADL Overall ADL's : Needs assistance/impaired                             Toileting- Clothing Manipulation and Hygiene: +2 for physical assistance;Maximal assistance;Sit to/from stand Toileting - Clothing Manipulation Details (indicate cue type and reason): maxA for pericare in standing, +2 assist for standing with RW     Functional mobility during ADLs: Moderate assistance;Cueing for  sequencing;Rolling walker (2 wheels);+2 for physical assistance General ADL Comments: pt able to stand 2x from elevated bed, +2 MOD A with cues to push up from surface with poor carryover. limited standing tolerance and step clearance to take  sidesteps towards [facility]     Communication Communication Communication: Impaired Factors Affecting Communication: Hearing impaired   Cognition Arousal: Alert Behavior During Therapy: WFL for tasks assessed/performed Cognition: History of cognitive impairments                               Following commands: Impaired Following commands impaired: Follows one step commands with increased time      Cueing   Cueing Techniques: Verbal cues, Gestural cues, Tactile cues        General Comments VSS throughout. BP monitored supine and sitting, no adverse symptoms. HR 60-70 bpm, up to 100 after standing attempt and returned to 70 bpm end of session. RLQ JP drain secure and intact at bandage site with no changes . Educated on repositioning frequently for pressure relief.     Pertinent Vitals/ Pain       Pain Assessment Pain Assessment: Faces Faces Pain Scale: Hurts a little bit Pain Location: bottom Pain Descriptors / Indicators: Grimacing, Guarding, Burning, Moaning Pain Intervention(s): Limited activity within patient's tolerance, Monitored during session, Repositioned   Frequency  Min 2X/week        Progress Toward Goals  OT Goals(current goals can now be found in the care plan section)  Progress towards OT goals: Progressing toward goals  Acute Rehab OT Goals OT Goal Formulation: With patient/family Time For Goal Achievement: 03/22/24 Potential to Achieve Goals: Fair  Plan         AM-PAC OT 6 Clicks Daily Activity     Outcome Measure   Help from another person eating meals?: None Help from another person taking care of personal grooming?: A Little Help from another person toileting, which includes using toliet, bedpan, or urinal?: A Lot Help from another person bathing (including washing, rinsing, drying)?: A Lot Help from another person to put on and taking off regular upper body clothing?: A Lot Help from another person to put on and taking off  regular lower body clothing?: A Lot 6 Click Score: 15    End of Session Equipment Utilized During Treatment: Rolling walker (2 wheels);Gait belt  OT Visit Diagnosis: Unsteadiness on feet (R26.81);Repeated falls (R29.6);Muscle weakness (generalized) (M62.81)   Activity Tolerance Patient tolerated treatment well   Patient Left in bed;with call bell/phone within reach;with bed alarm set;with family/visitor present   Nurse Communication Mobility status        Time: 8757-8683 OT Time Calculation (min): 34 min  Charges: OT General Charges $OT Visit: 1 Visit OT Treatments $Self Care/Home Management : 8-22 mins $Therapeutic Activity: 8-22 mins  Shelda Truby L. Wyett Narine, OTR/L  03/13/24, 2:05 PM

## 2024-03-14 DIAGNOSIS — K650 Generalized (acute) peritonitis: Secondary | ICD-10-CM | POA: Diagnosis not present

## 2024-03-14 NOTE — TOC Progression Note (Addendum)
 Transition of Care (TOC) - Progression Note    Patient Details  Name: [name] Young. MRN: [id number] Date of Birth: [birth year]  Transition of Care [facility]) CM/SW Contact  Lauraine JAYSON Carpen, LCSW Phone Number: 03/14/2024, 8:13 AM  Clinical Narrative:  Auth approved: [id number]. Valid 7/3-7/8. Left message for [facility] admissions coordinator to notify and see when their pharmacy would be able to get his medications.   8:40 am: SNF will not be able to get patient's medications until Monday due to the holiday weekend.  Expected Discharge Plan: Home/Self Care Barriers to Discharge: Continued Medical Work up  Expected Discharge Plan and Services     Post Acute Care Choice: Durable Medical Equipment Living arrangements for the past 2 months: Single Family Home                                       Social Determinants of Health (SDOH) Interventions SDOH Screenings   Food Insecurity: Unknown (03/06/2024)  Housing: Low Risk  (03/06/2024)  Transportation Needs: No Transportation Needs (03/06/2024)  Utilities: Not At Risk (03/06/2024)  Depression (PHQ2-9): Low Risk  (12/31/2023)  Financial Resource Strain: Low Risk  (12/15/2023)   Received from [facility]  Physical Activity: Unknown (10/08/2023)  Social Connections: Moderately Isolated (03/06/2024)  Stress: Patient Declined (10/08/2023)  Tobacco Use: Medium Risk (03/09/2024)    Readmission Risk Interventions    03/07/2024    3:03 PM  Readmission Risk Prevention Plan  Transportation Screening Complete  PCP or Specialist Appt within 3-5 Days Complete  Social Work Consult for Recovery Care Planning/Counseling Complete  Palliative Care Screening Not Applicable  Medication Review Oceanographer) Complete

## 2024-03-14 NOTE — Progress Notes (Signed)
 Progress Note    [name] Young.  FMW:[id number] DOB: [birth year]  DOA: 03/06/2024 PCP: Rilla Baller, MD      Brief Narrative:    Medical records reviewed and are as summarized below:   [name] is a 83 year old male with history of atrial fibrillation on Eliquis , hypertension, chronic HFpEF, CAD s/p CABG x 3, CKD 3a, non-insulin -dependent diabetes mellitus, history of endocarditis with Streptococcus bacteremia, history of E. coli bacteremia, who presented to the ED for chief concerns of perihepatic abscess seen on outpatient imaging.  He had been experiencing right flank and upper back pain for several weeks.  Vitals in the ED showed t of 98.5, respiration rate of 23, heart rate 74, blood pressure 101/47, SpO2 100% on room air.  Serum sodium is 136, potassium 5.4, chloride 100, bicarb 24, BUN of 63, serum creatinine 1.82, EGFR 37, nonfasting blood glucose 176, WBC 12.6, hemoglobin 8.3, platelets of 432. Initial lactic acid was 2.6 and on repeat is 2.9.  Blood cultures were drawn.  UA was not consistent with UTI.  ED treatment: Cefepime  2 g IV, Flagyl  500 mg IV one-time doses, sodium chloride  500 mg bolus x 2.   He was found to have AKI, perihepatic abscess and Enterococcus faecalis bacteremia.  He was treated with empiric IV antibiotics.  He developed hypotension and acute blood loss anemia.  He was treated with IV fluids and was transfused with 3 units of packed red blood cells.      Assessment/Plan:   Principal Problem:   Perihepatic abscess (HCC) Active Problems:   AKI (acute kidney injury) (HCC)   Hyperkalemia   Anemia   PAF (paroxysmal atrial fibrillation) (HCC)   S/P CABG x 3   Essential hypertension   Hyperlipidemia associated with type 2 diabetes mellitus (HCC)   FTT (failure to thrive) in adult   General weakness   Obesity, Class I, BMI 30-34.9   DNR (do not resuscitate)   Insomnia   Malnutrition of moderate degree   Pressure  injury of skin   Bacteremia   Nutrition Problem: Moderate Malnutrition Etiology: chronic illness (CHF)  Signs/Symptoms: mild fat depletion, moderate fat depletion, mild muscle depletion, moderate muscle depletion, energy intake < 75% for > or equal to 1 month   Body mass index is 29.99 kg/m.  (Obesity)  Perihepatic abscess, Enterococcus faecalis bacteremia: S/p image guided drainage of abdominal wall abscess and hematoma on 03/07/2024.  Repeat CT abdomen shows improvement in fluid collection.  TEE on 03/12/2024 did not show any evidence of vegetations or endocarditis. Initially treated with broad-spectrum antibiotics (IV cefepime , IV metronidazole ).  Antibiotics de-escalated to IV daptomycin  then transitioned to Zosyn  and subsequently to Unasyn .  Plan to transition to Augmentin  on discharge. IR will reevaluate abdominal drain in 1 to 2 weeks Follow-up with ID specialist in 2 weeks   AKI: Improved.  Repeat BMP tomorrow Hyperkalemia: Improved.   Hypotension: Improved.  Continue midodrine    Acute blood loss anemia: Hemoglobin improved.  S/p transfusion with 3 units of PRBCs.   Chronic HFpEF: Compensated.  Continue torsemide  and spironolactone .  Paroxysmal atrial fibrillation, CAD s/p CABG x 3: continue Eliquis    General Weakness: PT and OT recommending discharge to SNF.   Comorbidities include moderate malnutrition related to chronic illness, hyperlipidemia, type 2 diabetes mellitus, hypertension   Medically stable for discharge but awaiting placement to SNF.  SNF cannot take patient over the weekend.  Discussed with [place], Child psychotherapist.   Diet Order  Diet Carb Modified Fluid consistency: Thin; Room service appropriate? Yes  Diet effective now                            Consultants:  ID specialist Intervention radiologist  Procedures: CT and ultrasound-guided drainage of abdominal wall abscess/infected hematoma on 03/07/2024 TEE on  03/12/2024    Medications:    apixaban   5 mg Oral BID   vitamin C   500 mg Oral BID   cyanocobalamin   1,000 mcg Oral Q M,W,F   Fe Fum-Vit C-Vit B12-FA  1 capsule Oral QPC breakfast   feeding supplement  237 mL Oral TID BM   Gerhardt's butt cream   Topical BID   midodrine   10 mg Oral TID WC   mirtazapine   30 mg Oral QHS   multivitamin with minerals  1 tablet Oral Daily   rosuvastatin   10 mg Oral QHS   sodium chloride  flush  5 mL Intracatheter Q8H   spironolactone   25 mg Oral Daily   tamsulosin   0.4 mg Oral QPC supper   torsemide   20 mg Oral QODAY   Continuous Infusions:  ampicillin -sulbactam (UNASYN ) IV 3 g (03/14/24 1138)     Anti-infectives (From admission, onward)    Start     Dose/Rate Route Frequency Ordered Stop   03/11/24 2200  Ampicillin -Sulbactam (UNASYN ) 3 g in sodium chloride  0.9 % 100 mL IVPB        3 g 200 mL/hr over 30 Minutes Intravenous Every 6 hours 03/11/24 1619     03/10/24 1800  piperacillin -tazobactam (ZOSYN ) IVPB 3.375 g        3.375 g 12.5 mL/hr over 240 Minutes Intravenous Every 8 hours 03/10/24 1649 03/11/24 1739   03/08/24 1100  DAPTOmycin  (CUBICIN ) 1,000 mg in sodium chloride  0.9 % IVPB  Status:  Discontinued        1,000 mg 140 mL/hr over 30 Minutes Intravenous Daily 03/08/24 0825 03/10/24 1649   03/07/24 0100  ceFEPIme  (MAXIPIME ) 2 g in sodium chloride  0.9 % 100 mL IVPB  Status:  Discontinued        2 g 200 mL/hr over 30 Minutes Intravenous Every 12 hours 03/06/24 1458 03/10/24 1649   03/06/24 2000  metroNIDAZOLE  (FLAGYL ) IVPB 500 mg  Status:  Discontinued        500 mg 100 mL/hr over 60 Minutes Intravenous Every 12 hours 03/06/24 1441 03/10/24 1649   03/06/24 1300  ceFEPIme  (MAXIPIME ) 2 g in sodium chloride  0.9 % 100 mL IVPB        2 g 200 mL/hr over 30 Minutes Intravenous  Once 03/06/24 1245 03/06/24 1410   03/06/24 1300  metroNIDAZOLE  (FLAGYL ) IVPB 500 mg        500 mg 100 mL/hr over 60 Minutes Intravenous  Once 03/06/24 1245 03/06/24 1622               Family Communication/Anticipated D/C date and plan/Code Status   DVT prophylaxis: Place TED hose Start: 03/06/24 1440 apixaban  (ELIQUIS ) tablet 5 mg     Code Status: Full Code  Family Communication: None Disposition Plan: Plan to discharge to SNF   Status is: Inpatient Remains inpatient appropriate because: Perihepatic abscess and bacteremia       Subjective:   Interval events noted.  He has no complaints.  Objective:    Vitals:   03/13/24 1454 03/13/24 2038 03/14/24 0343 03/14/24 0808  BP: (!) 102/53 127/69 (!) 118/57 (!) 122/51  Pulse: 95  79 75 (!) 41  Resp: 17 18 18 16   Temp: 97.8 F (36.6 C) 98.2 F (36.8 C) 98 F (36.7 C) 98.2 F (36.8 C)  TempSrc: Oral Oral  Oral  SpO2: 100% 99% 97% 96%  Weight:      Height:       No data found.   Intake/Output Summary (Last 24 hours) at 03/14/2024 1411 Last data filed at 03/14/2024 1047 Gross per 24 hour  Intake 125 ml  Output 290 ml  Net -165 ml   Filed Weights   03/07/24 1445 03/12/24 1538 03/13/24 0500  Weight: 98.1 kg 93.5 kg 94.8 kg    Exam:   GEN: NAD SKIN: Warm and dry EYES: No pallor or icterus ENT: MMM CV: RRR PULM: CTA B ABD: soft, obese, NT, +BS, + abdominal drain with small amount of bloody fluid CNS: AAO x 3, non focal EXT: No edema or tenderness       Data Reviewed:   I have personally reviewed following labs and imaging studies:  Labs: Labs show the following:   Basic Metabolic Panel: Recent Labs  Lab 03/08/24 0455 03/09/24 0818 03/11/24 0302 03/13/24 0436  NA 140 140 139  --   K 5.0 5.1 4.8  --   CL 109 111 109  --   CO2 24 24 22   --   GLUCOSE 112* 106* 111*  --   BUN 54* 40* 29*  --   CREATININE 1.41* 1.17 1.00  --   CALCIUM  8.0* 8.1* 8.4*  --   MG  --   --   --  1.8  PHOS  --   --   --  2.9   GFR Estimated Creatinine Clearance: 66.9 mL/min (by C-G formula based on SCr of 1 mg/dL). Liver Function Tests: Recent Labs  Lab 03/08/24 0455   AST 18  ALT 20  ALKPHOS 76  BILITOT 0.7  PROT 5.7*  ALBUMIN  1.6*   No results for input(s): LIPASE, AMYLASE in the last 168 hours. No results for input(s): AMMONIA in the last 168 hours. Coagulation profile No results for input(s): INR, PROTIME in the last 168 hours.   CBC: Recent Labs  Lab 03/07/24 1702 03/08/24 0455 03/08/24 1659 03/09/24 0818 03/11/24 0302  WBC  --  7.3  --  8.1 9.9  HGB 7.9* 7.4* 8.7* 8.8* 9.0*  HCT 26.3* 24.9* 28.7* 29.5* 30.7*  MCV  --  81.6  --  81.9 83.4  PLT  --  309  --  329 323   Cardiac Enzymes: Recent Labs  Lab 03/08/24 0455  CKTOTAL 19*   BNP (last 3 results) Recent Labs    04/25/23 1516  PROBNP 142.0*   CBG: Recent Labs  Lab 03/12/24 2054 03/13/24 0810 03/13/24 1207 03/13/24 1615  GLUCAP 155* 94 153* 146*   D-Dimer: No results for input(s): DDIMER in the last 72 hours. Hgb A1c: No results for input(s): HGBA1C in the last 72 hours. Lipid Profile: No results for input(s): CHOL, HDL, LDLCALC, TRIG, CHOLHDL, LDLDIRECT in the last 72 hours. Thyroid  function studies: No results for input(s): TSH, T4TOTAL, T3FREE, THYROIDAB in the last 72 hours.  Invalid input(s): FREET3 Anemia work up: No results for input(s): VITAMINB12, FOLATE, FERRITIN, TIBC, IRON, RETICCTPCT in the last 72 hours. Sepsis Labs: Recent Labs  Lab 03/08/24 0455 03/09/24 0818 03/11/24 0302  WBC 7.3 8.1 9.9    Microbiology Recent Results (from the past 240 hours)  Blood Culture (routine x 2)  Status: Abnormal   Collection Time: 03/06/24 11:56 AM   Specimen: BLOOD LEFT ARM  Result Value Ref Range Status   Specimen Description   Final    BLOOD LEFT ARM Performed at [facility], [address], [place], [place] [address]    Special Requests   Final    BOTTLES DRAWN AEROBIC AND ANAEROBIC Blood Culture adequate volume Performed at [facility], [address] [place],  [place] [address]    Culture  Setup Time   Final    GRAM POSITIVE COCCI ANAEROBIC BOTTLE ONLY CRITICAL RESULT CALLED TO, READ BACK BY AND VERIFIED WITH: ALEX CHAPPELL AT 0802 03/08/24.PMF Performed at [facility], [address] [place], [place] [address]    Culture (A)  Final    ENTEROCOCCUS FAECALIS VANCOMYCIN  RESISTANT ENTEROCOCCUS ISOLATED    Report Status 03/10/2024 FINAL  Final   Organism ID, Bacteria ENTEROCOCCUS FAECALIS  Final      Susceptibility   Enterococcus faecalis - MIC*    AMPICILLIN  <=2 SENSITIVE Sensitive     VANCOMYCIN  >=32 RESISTANT Resistant     GENTAMICIN SYNERGY SENSITIVE Sensitive     * ENTEROCOCCUS FAECALIS  Blood Culture ID Panel (Reflexed)     Status: Abnormal   Collection Time: 03/06/24 11:56 AM  Result Value Ref Range Status   Enterococcus faecalis DETECTED (A) NOT DETECTED Final    Comment: CRITICAL RESULT CALLED TO, READ BACK BY AND VERIFIED WITH: ALEX CHAPPELL AT 0802 03/08/24.PMF    Enterococcus Faecium NOT DETECTED NOT DETECTED Final   Listeria monocytogenes NOT DETECTED NOT DETECTED Final   Staphylococcus species NOT DETECTED NOT DETECTED Final   Staphylococcus aureus (BCID) NOT DETECTED NOT DETECTED Final   Staphylococcus epidermidis NOT DETECTED NOT DETECTED Final   Staphylococcus lugdunensis NOT DETECTED NOT DETECTED Final   Streptococcus species NOT DETECTED NOT DETECTED Final   Streptococcus agalactiae NOT DETECTED NOT DETECTED Final   Streptococcus pneumoniae NOT DETECTED NOT DETECTED Final   Streptococcus pyogenes NOT DETECTED NOT DETECTED Final   A.calcoaceticus-baumannii NOT DETECTED NOT DETECTED Final   Bacteroides fragilis NOT DETECTED NOT DETECTED Final   Enterobacterales NOT DETECTED NOT DETECTED Final   Enterobacter cloacae complex NOT DETECTED NOT DETECTED Final   Escherichia coli NOT DETECTED NOT DETECTED Final   Klebsiella aerogenes NOT DETECTED NOT DETECTED Final   Klebsiella oxytoca NOT DETECTED NOT DETECTED Final    Klebsiella pneumoniae NOT DETECTED NOT DETECTED Final   Proteus species NOT DETECTED NOT DETECTED Final   Salmonella species NOT DETECTED NOT DETECTED Final   Serratia marcescens NOT DETECTED NOT DETECTED Final   Haemophilus influenzae NOT DETECTED NOT DETECTED Final   Neisseria meningitidis NOT DETECTED NOT DETECTED Final   Pseudomonas aeruginosa NOT DETECTED NOT DETECTED Final   Stenotrophomonas maltophilia NOT DETECTED NOT DETECTED Final   Candida albicans NOT DETECTED NOT DETECTED Final   Candida auris NOT DETECTED NOT DETECTED Final   Candida glabrata NOT DETECTED NOT DETECTED Final   Candida krusei NOT DETECTED NOT DETECTED Final   Candida parapsilosis NOT DETECTED NOT DETECTED Final   Candida tropicalis NOT DETECTED NOT DETECTED Final   Cryptococcus neoformans/gattii NOT DETECTED NOT DETECTED Final   Vancomycin  resistance DETECTED (A) NOT DETECTED Final    Comment: CRITICAL RESULT CALLED TO, READ BACK BY AND VERIFIED WITH: ALEX CHAPPELL AT 0802 03/08/24.PMF Performed at [facility], [address], [place], [place] [address]   Blood Culture (routine x 2)     Status: None  Collection Time: 03/06/24  2:25 PM   Specimen: BLOOD  Result Value Ref Range Status   Specimen Description BLOOD BLOOD LEFT HAND [facility]  Final   Special Requests   Final    BOTTLES DRAWN AEROBIC AND ANAEROBIC Blood Culture adequate volume   Culture   Final    NO GROWTH 5 DAYS Performed at [facility], [address], [place], [place] [address]    Report Status 03/11/2024 FINAL  Final  Aerobic/Anaerobic Culture w Gram Stain (surgical/deep wound)     Status: None   Collection Time: 03/07/24  4:00 PM   Specimen: Abscess  Result Value Ref Range Status   Specimen Description   Final    ABSCESS Performed at [facility], [address] [place], [place] [address]    Special Requests   Final    NONE Performed at [facility], [address] [place], [place]  [address]    Gram Stain   Final    MODERATE WBC PRESENT, PREDOMINANTLY PMN MODERATE GRAM POSITIVE COCCI RARE GRAM NEGATIVE RODS Performed at [facility], [address], [place], [place] [address]    Culture   Final    ABUNDANT ENTEROCOCCUS FAECALIS ABUNDANT ESCHERICHIA COLI ABUNDANT PROTEUS MIRABILIS NO ANAEROBES ISOLATED VANCOMYCIN  RESISTANT ENTEROCOCCUS ISOLATED    Report Status 03/12/2024 FINAL  Final   Organism ID, Bacteria ESCHERICHIA COLI  Final   Organism ID, Bacteria PROTEUS MIRABILIS  Final   Organism ID, Bacteria ENTEROCOCCUS FAECALIS  Final      Susceptibility   Escherichia coli - MIC*    AMPICILLIN  <=2 SENSITIVE Sensitive     CEFEPIME  <=0.12 SENSITIVE Sensitive     CEFTAZIDIME  <=1 SENSITIVE Sensitive     CEFTRIAXONE  <=0.25 SENSITIVE Sensitive     CIPROFLOXACIN  <=0.25 SENSITIVE Sensitive     GENTAMICIN <=1 SENSITIVE Sensitive     IMIPENEM <=0.25 SENSITIVE Sensitive     TRIMETH/SULFA <=20 SENSITIVE Sensitive     AMPICILLIN /SULBACTAM <=2 SENSITIVE Sensitive     PIP/TAZO <=4 SENSITIVE Sensitive ug/mL    * ABUNDANT ESCHERICHIA COLI   Enterococcus faecalis - MIC*    AMPICILLIN  <=2 SENSITIVE Sensitive     VANCOMYCIN  >=32 RESISTANT Resistant     GENTAMICIN SYNERGY SENSITIVE Sensitive     * ABUNDANT ENTEROCOCCUS FAECALIS   Proteus mirabilis - MIC*    AMPICILLIN  <=2 SENSITIVE Sensitive     CEFEPIME  <=0.12 SENSITIVE Sensitive     CEFTAZIDIME  <=1 SENSITIVE Sensitive     CEFTRIAXONE  <=0.25 SENSITIVE Sensitive     CIPROFLOXACIN  <=0.25 SENSITIVE Sensitive     GENTAMICIN <=1 SENSITIVE Sensitive     IMIPENEM <=0.25 SENSITIVE Sensitive     TRIMETH/SULFA <=20 SENSITIVE Sensitive     AMPICILLIN /SULBACTAM <=2 SENSITIVE Sensitive     PIP/TAZO <=4 SENSITIVE Sensitive ug/mL    * ABUNDANT PROTEUS MIRABILIS  Culture, blood (single) w Reflex to ID Panel     Status: None (Preliminary result)   Collection Time: 03/10/24  5:29 PM   Specimen: BLOOD  Result Value Ref Range Status    Specimen Description BLOOD BLOOD LEFT ARM BLA  Final   Special Requests   Final    BOTTLES DRAWN AEROBIC AND ANAEROBIC Blood Culture adequate volume   Culture   Final    NO GROWTH 4 DAYS Performed at [facility], [address] [place], [place] [address]    Report Status PENDING  Incomplete    Procedures and diagnostic studies:  No results found.  LOS: 8 days   Zahara Rembert  [facility]   Pager on [email or web address]. If 7PM-7AM, please contact night-coverage at [email or web address]     03/14/2024, 2:11 PM

## 2024-03-14 NOTE — Plan of Care (Signed)
  Problem: Health Behavior/Discharge Planning: Goal: Ability to manage health-related needs will improve Outcome: Progressing   Problem: Education: Goal: Knowledge of General Education information will improve Description: Including pain rating scale, medication(s)/side effects and non-pharmacologic comfort measures Outcome: Progressing   Problem: Clinical Measurements: Goal: Ability to maintain clinical measurements within normal limits will improve Outcome: Progressing Goal: Will remain free from infection Outcome: Progressing Goal: Diagnostic test results will improve Outcome: Progressing Goal: Respiratory complications will improve Outcome: Progressing Goal: Cardiovascular complication will be avoided Outcome: Progressing

## 2024-03-15 DIAGNOSIS — K650 Generalized (acute) peritonitis: Secondary | ICD-10-CM | POA: Diagnosis not present

## 2024-03-15 LAB — BASIC METABOLIC PANEL WITH GFR
Anion gap: 10 (ref 5–15)
BUN: 21 mg/dL (ref 8–23)
CO2: 26 mmol/L (ref 22–32)
Calcium: 8.5 mg/dL — ABNORMAL LOW (ref 8.9–10.3)
Chloride: 104 mmol/L (ref 98–111)
Creatinine, Ser: 0.66 mg/dL (ref 0.61–1.24)
GFR, Estimated: >60 mL/min (ref >60)
Glucose, Bld: 98 mg/dL (ref 70–99)
Potassium: 4.2 mmol/L (ref 3.5–5.1)
Sodium: 140 mmol/L (ref 135–145)

## 2024-03-15 LAB — CULTURE, BLOOD (SINGLE)
Culture: NO GROWTH
Special Requests: ADEQUATE

## 2024-03-15 NOTE — Progress Notes (Signed)
 Progress Note    [name]  FMW:[id number] DOB: Jul 16, 1941  DOA: 03/06/2024 PCP: Rilla Baller, MD      Brief Narrative:    Medical records reviewed and are as summarized below:   Mr. Jewelz Kobus is a 83 year old male with history of atrial fibrillation on Eliquis , hypertension, chronic HFpEF, CAD s/p CABG x 3, CKD 3a, non-insulin -dependent diabetes mellitus, history of endocarditis with Streptococcus bacteremia, history of E. coli bacteremia, who presented to the ED for chief concerns of perihepatic abscess seen on outpatient imaging.  He had been experiencing right flank and upper back pain for several weeks.  Vitals in the ED showed t of 98.5, respiration rate of 23, heart rate 74, blood pressure 101/47, SpO2 100% on room air.  Serum sodium is 136, potassium 5.4, chloride 100, bicarb 24, BUN of 63, serum creatinine 1.82, EGFR 37, nonfasting blood glucose 176, WBC 12.6, hemoglobin 8.3, platelets of 432. Initial lactic acid was 2.6 and on repeat is 2.9.  Blood cultures were drawn.  UA was not consistent with UTI.  ED treatment: Cefepime  2 g IV, Flagyl  500 mg IV one-time doses, sodium chloride  500 mg bolus x 2.   He was found to have AKI, perihepatic abscess and Enterococcus faecalis bacteremia.  He was treated with empiric IV antibiotics.  He developed hypotension and acute blood loss anemia.  He was treated with IV fluids and was transfused with 3 units of packed red blood cells.      Assessment/Plan:   Principal Problem:   Perihepatic abscess (HCC) Active Problems:   AKI (acute kidney injury) (HCC)   Hyperkalemia   Anemia   PAF (paroxysmal atrial fibrillation) (HCC)   S/P CABG x 3   Essential hypertension   Hyperlipidemia associated with type 2 diabetes mellitus (HCC)   FTT (failure to thrive) in adult   General weakness   Obesity, Class I, BMI 30-34.9   DNR (do not resuscitate)   Insomnia   Malnutrition of moderate degree   Pressure  injury of skin   Bacteremia   Nutrition Problem: Moderate Malnutrition Etiology: chronic illness (CHF)  Signs/Symptoms: mild fat depletion, moderate fat depletion, mild muscle depletion, moderate muscle depletion, energy intake < 75% for > or equal to 1 month   Body mass index is 29.99 kg/m.  (Obesity)  Perihepatic abscess, Enterococcus faecalis bacteremia: S/p image guided drainage of abdominal wall abscess and hematoma on 03/07/2024.  Repeat CT abdomen shows improvement in fluid collection.  TEE on 03/12/2024 did not show any evidence of vegetations or endocarditis. Initially treated with broad-spectrum antibiotics (IV cefepime , IV metronidazole ).  Antibiotics de-escalated to IV daptomycin  then transitioned to Zosyn  and subsequently to Unasyn .  Plan to transition to Augmentin  on discharge. IR will reevaluate abdominal drain in 1 to 2 weeks Follow-up with ID specialist in 2 weeks   AKI and hyperkalemia: Resolved:   Hypotension: Improved.  Continue midodrine    Acute blood loss anemia: Hemoglobin improved.  S/p transfusion with 3 units of PRBCs.   Chronic HFpEF: Compensated.  Continue torsemide  and spironolactone .  Paroxysmal atrial fibrillation, CAD s/p CABG x 3: continue Eliquis    General Weakness: PT and OT recommending discharge to SNF.   Comorbidities include moderate malnutrition related to chronic illness, hyperlipidemia, type 2 diabetes mellitus, hypertension      Diet Order             Diet Carb Modified Fluid consistency: Thin; Room service appropriate? Yes  Diet effective now  Consultants:  ID specialist Intervention radiologist  Procedures: CT and ultrasound-guided drainage of abdominal wall abscess/infected hematoma on 03/07/2024 TEE on 03/12/2024    Medications:    apixaban   5 mg Oral BID   vitamin C   500 mg Oral BID   cyanocobalamin   1,000 mcg Oral Q M,W,F   Fe Fum-Vit C-Vit B12-FA  1 capsule Oral QPC  breakfast   feeding supplement  237 mL Oral TID BM   Gerhardt's butt cream   Topical BID   midodrine   10 mg Oral TID WC   mirtazapine   30 mg Oral QHS   multivitamin with minerals  1 tablet Oral Daily   rosuvastatin   10 mg Oral QHS   sodium chloride  flush  5 mL Intracatheter Q8H   spironolactone   25 mg Oral Daily   tamsulosin   0.4 mg Oral QPC supper   torsemide   20 mg Oral QODAY   Continuous Infusions:  ampicillin -sulbactam (UNASYN ) IV 3 g (03/15/24 1115)     Anti-infectives (From admission, onward)    Start     Dose/Rate Route Frequency Ordered Stop   03/11/24 2200  Ampicillin -Sulbactam (UNASYN ) 3 g in sodium chloride  0.9 % 100 mL IVPB        3 g 200 mL/hr over 30 Minutes Intravenous Every 6 hours 03/11/24 1619     03/10/24 1800  piperacillin -tazobactam (ZOSYN ) IVPB 3.375 g        3.375 g 12.5 mL/hr over 240 Minutes Intravenous Every 8 hours 03/10/24 1649 03/11/24 1739   03/08/24 1100  DAPTOmycin  (CUBICIN ) 1,000 mg in sodium chloride  0.9 % IVPB  Status:  Discontinued        1,000 mg 140 mL/hr over 30 Minutes Intravenous Daily 03/08/24 0825 03/10/24 1649   03/07/24 0100  ceFEPIme  (MAXIPIME ) 2 g in sodium chloride  0.9 % 100 mL IVPB  Status:  Discontinued        2 g 200 mL/hr over 30 Minutes Intravenous Every 12 hours 03/06/24 1458 03/10/24 1649   03/06/24 2000  metroNIDAZOLE  (FLAGYL ) IVPB 500 mg  Status:  Discontinued        500 mg 100 mL/hr over 60 Minutes Intravenous Every 12 hours 03/06/24 1441 03/10/24 1649   03/06/24 1300  ceFEPIme  (MAXIPIME ) 2 g in sodium chloride  0.9 % 100 mL IVPB        2 g 200 mL/hr over 30 Minutes Intravenous  Once 03/06/24 1245 03/06/24 1410   03/06/24 1300  metroNIDAZOLE  (FLAGYL ) IVPB 500 mg        500 mg 100 mL/hr over 60 Minutes Intravenous  Once 03/06/24 1245 03/06/24 1622              Family Communication/Anticipated D/C date and plan/Code Status   DVT prophylaxis: Place TED hose Start: 03/06/24 1440 apixaban  (ELIQUIS ) tablet 5 mg      Code Status: Full Code  Family Communication: None Disposition Plan: Plan to discharge to SNF   Status is: Inpatient Remains inpatient appropriate because: Perihepatic abscess and bacteremia       Subjective:   No acute events overnight.  He has no complaints.  He feels better.  His wife was at the bedside.  Objective:    Vitals:   03/14/24 1537 03/14/24 2103 03/15/24 0324 03/15/24 0800  BP: (!) 102/58 (!) 117/59 (!) 143/109 (!) 109/45  Pulse: 80 69 83 (!) 40  Resp: 16 20 20 14   Temp: 97.6 F (36.4 C) 98.3 F (36.8 C) 98.1 F (36.7 C) 98.2 F (36.8 C)  TempSrc: Oral Oral Oral Oral  SpO2: 98% 99% 98% 98%  Weight:      Height:       No data found.   Intake/Output Summary (Last 24 hours) at 03/15/2024 1337 Last data filed at 03/15/2024 9082 Gross per 24 hour  Intake 125 ml  Output 1620 ml  Net -1495 ml   Filed Weights   03/07/24 1445 03/12/24 1538 03/13/24 0500  Weight: 98.1 kg 93.5 kg 94.8 kg    Exam:  GEN: NAD SKIN: Warm and dry EYES: Pallor or icterus ENT: MMM CV: RRR PULM: CTA B ABD: soft, ND, NT, +BS, + abdominal drain with bloody fluid CNS: AAO x 3, non focal EXT: No edema or tenderness       Data Reviewed:   I have personally reviewed following labs and imaging studies:  Labs: Labs show the following:   Basic Metabolic Panel: Recent Labs  Lab 03/09/24 0818 03/11/24 0302 03/13/24 0436 03/15/24 0441  NA 140 139  --  140  K 5.1 4.8  --  4.2  CL 111 109  --  104  CO2 24 22  --  26  GLUCOSE 106* 111*  --  98  BUN 40* 29*  --  21  CREATININE 1.17 1.00  --  0.66  CALCIUM  8.1* 8.4*  --  8.5*  MG  --   --  1.8  --   PHOS  --   --  2.9  --    GFR Estimated Creatinine Clearance: 83.7 mL/min (by C-G formula based on SCr of 0.66 mg/dL). Liver Function Tests: No results for input(s): AST, ALT, ALKPHOS, BILITOT, PROT, ALBUMIN  in the last 168 hours.  No results for input(s): LIPASE, AMYLASE in the last 168  hours. No results for input(s): AMMONIA in the last 168 hours. Coagulation profile No results for input(s): INR, PROTIME in the last 168 hours.   CBC: Recent Labs  Lab 03/08/24 1659 03/09/24 0818 03/11/24 0302  WBC  --  8.1 9.9  HGB 8.7* 8.8* 9.0*  HCT 28.7* 29.5* 30.7*  MCV  --  81.9 83.4  PLT  --  329 323   Cardiac Enzymes: No results for input(s): CKTOTAL, CKMB, CKMBINDEX, TROPONINI in the last 168 hours.  BNP (last 3 results) Recent Labs    04/25/23 1516  PROBNP 142.0*   CBG: Recent Labs  Lab 03/12/24 2054 03/13/24 0810 03/13/24 1207 03/13/24 1615  GLUCAP 155* 94 153* 146*   D-Dimer: No results for input(s): DDIMER in the last 72 hours. Hgb A1c: No results for input(s): HGBA1C in the last 72 hours. Lipid Profile: No results for input(s): CHOL, HDL, LDLCALC, TRIG, CHOLHDL, LDLDIRECT in the last 72 hours. Thyroid  function studies: No results for input(s): TSH, T4TOTAL, T3FREE, THYROIDAB in the last 72 hours.  Invalid input(s): FREET3 Anemia work up: No results for input(s): VITAMINB12, FOLATE, FERRITIN, TIBC, IRON, RETICCTPCT in the last 72 hours. Sepsis Labs: Recent Labs  Lab 03/09/24 0818 03/11/24 0302  WBC 8.1 9.9    Microbiology Recent Results (from the past 240 hours)  Blood Culture (routine x 2)     Status: Abnormal   Collection Time: 03/06/24 11:56 AM   Specimen: BLOOD LEFT ARM  Result Value Ref Range Status   Specimen Description   Final    BLOOD LEFT ARM Performed at [facility], [address] [place], [place] [address]    Special Requests   Final    BOTTLES DRAWN AEROBIC AND ANAEROBIC Blood  Culture adequate volume Performed at [facility], [address] [place], [place] [address]    Culture  Setup Time   Final    GRAM POSITIVE COCCI ANAEROBIC BOTTLE ONLY CRITICAL RESULT CALLED TO, READ BACK BY AND VERIFIED WITH: ALEX CHAPPELL AT 0802 03/08/24.PMF Performed  at [facility], [address] [place], [place] [address]    Culture (A)  Final    ENTEROCOCCUS FAECALIS VANCOMYCIN  RESISTANT ENTEROCOCCUS ISOLATED    Report Status 03/10/2024 FINAL  Final   Organism ID, Bacteria ENTEROCOCCUS FAECALIS  Final      Susceptibility   Enterococcus faecalis - MIC*    AMPICILLIN  <=2 SENSITIVE Sensitive     VANCOMYCIN  >=32 RESISTANT Resistant     GENTAMICIN SYNERGY SENSITIVE Sensitive     * ENTEROCOCCUS FAECALIS  Blood Culture ID Panel (Reflexed)     Status: Abnormal   Collection Time: 03/06/24 11:56 AM  Result Value Ref Range Status   Enterococcus faecalis DETECTED (A) NOT DETECTED Final    Comment: CRITICAL RESULT CALLED TO, READ BACK BY AND VERIFIED WITH: ALEX CHAPPELL AT 0802 03/08/24.PMF    Enterococcus Faecium NOT DETECTED NOT DETECTED Final   Listeria monocytogenes NOT DETECTED NOT DETECTED Final   Staphylococcus species NOT DETECTED NOT DETECTED Final   Staphylococcus aureus (BCID) NOT DETECTED NOT DETECTED Final   Staphylococcus epidermidis NOT DETECTED NOT DETECTED Final   Staphylococcus lugdunensis NOT DETECTED NOT DETECTED Final   Streptococcus species NOT DETECTED NOT DETECTED Final   Streptococcus agalactiae NOT DETECTED NOT DETECTED Final   Streptococcus pneumoniae NOT DETECTED NOT DETECTED Final   Streptococcus pyogenes NOT DETECTED NOT DETECTED Final   A.calcoaceticus-baumannii NOT DETECTED NOT DETECTED Final   Bacteroides fragilis NOT DETECTED NOT DETECTED Final   Enterobacterales NOT DETECTED NOT DETECTED Final   Enterobacter cloacae complex NOT DETECTED NOT DETECTED Final   Escherichia coli NOT DETECTED NOT DETECTED Final   Klebsiella aerogenes NOT DETECTED NOT DETECTED Final   Klebsiella oxytoca NOT DETECTED NOT DETECTED Final   Klebsiella pneumoniae NOT DETECTED NOT DETECTED Final   Proteus species NOT DETECTED NOT DETECTED Final   Salmonella species NOT DETECTED NOT DETECTED Final   Serratia marcescens NOT DETECTED NOT  DETECTED Final   Haemophilus influenzae NOT DETECTED NOT DETECTED Final   Neisseria meningitidis NOT DETECTED NOT DETECTED Final   Pseudomonas aeruginosa NOT DETECTED NOT DETECTED Final   Stenotrophomonas maltophilia NOT DETECTED NOT DETECTED Final   Candida albicans NOT DETECTED NOT DETECTED Final   Candida auris NOT DETECTED NOT DETECTED Final   Candida glabrata NOT DETECTED NOT DETECTED Final   Candida krusei NOT DETECTED NOT DETECTED Final   Candida parapsilosis NOT DETECTED NOT DETECTED Final   Candida tropicalis NOT DETECTED NOT DETECTED Final   Cryptococcus neoformans/gattii NOT DETECTED NOT DETECTED Final   Vancomycin  resistance DETECTED (A) NOT DETECTED Final    Comment: CRITICAL RESULT CALLED TO, READ BACK BY AND VERIFIED WITH: ALEX CHAPPELL AT 0802 03/08/24.PMF Performed at [facility], [address] [place], [place] [address]   Blood Culture (routine x 2)     Status: None   Collection Time: 03/06/24  2:25 PM   Specimen: BLOOD  Result Value Ref Range Status   Specimen Description BLOOD BLOOD LEFT HAND [facility]  Final   Special Requests   Final    BOTTLES DRAWN AEROBIC AND ANAEROBIC Blood Culture adequate volume   Culture   Final    NO GROWTH 5 DAYS Performed at [facility], [address] [place], [place] [address]    Report Status 03/11/2024 FINAL  Final  Aerobic/Anaerobic Culture w Gram Stain (surgical/deep wound)     Status: None   Collection Time: 03/07/24  4:00 PM   Specimen: Abscess  Result Value Ref Range Status   Specimen Description   Final    ABSCESS Performed at [facility], [address] [place], [place] [address]    Special Requests   Final    NONE Performed at [facility], [address] [place], [place] [address]    Gram Stain   Final    MODERATE WBC PRESENT, PREDOMINANTLY PMN MODERATE GRAM POSITIVE COCCI RARE GRAM NEGATIVE RODS Performed at [facility], [address], [place], [place]  [address]    Culture   Final    ABUNDANT ENTEROCOCCUS FAECALIS ABUNDANT ESCHERICHIA COLI ABUNDANT PROTEUS MIRABILIS NO ANAEROBES ISOLATED VANCOMYCIN  RESISTANT ENTEROCOCCUS ISOLATED    Report Status 03/12/2024 FINAL  Final   Organism ID, Bacteria ESCHERICHIA COLI  Final   Organism ID, Bacteria PROTEUS MIRABILIS  Final   Organism ID, Bacteria ENTEROCOCCUS FAECALIS  Final      Susceptibility   Escherichia coli - MIC*    AMPICILLIN  <=2 SENSITIVE Sensitive     CEFEPIME  <=0.12 SENSITIVE Sensitive     CEFTAZIDIME  <=1 SENSITIVE Sensitive     CEFTRIAXONE  <=0.25 SENSITIVE Sensitive     CIPROFLOXACIN  <=0.25 SENSITIVE Sensitive     GENTAMICIN <=1 SENSITIVE Sensitive     IMIPENEM <=0.25 SENSITIVE Sensitive     TRIMETH/SULFA <=20 SENSITIVE Sensitive     AMPICILLIN /SULBACTAM <=2 SENSITIVE Sensitive     PIP/TAZO <=4 SENSITIVE Sensitive ug/mL    * ABUNDANT ESCHERICHIA COLI   Enterococcus faecalis - MIC*    AMPICILLIN  <=2 SENSITIVE Sensitive     VANCOMYCIN  >=32 RESISTANT Resistant     GENTAMICIN SYNERGY SENSITIVE Sensitive     * ABUNDANT ENTEROCOCCUS FAECALIS   Proteus mirabilis - MIC*    AMPICILLIN  <=2 SENSITIVE Sensitive     CEFEPIME  <=0.12 SENSITIVE Sensitive     CEFTAZIDIME  <=1 SENSITIVE Sensitive     CEFTRIAXONE  <=0.25 SENSITIVE Sensitive     CIPROFLOXACIN  <=0.25 SENSITIVE Sensitive     GENTAMICIN <=1 SENSITIVE Sensitive     IMIPENEM <=0.25 SENSITIVE Sensitive     TRIMETH/SULFA <=20 SENSITIVE Sensitive     AMPICILLIN /SULBACTAM <=2 SENSITIVE Sensitive     PIP/TAZO <=4 SENSITIVE Sensitive ug/mL    * ABUNDANT PROTEUS MIRABILIS  Culture, blood (single) w Reflex to ID Panel     Status: None   Collection Time: 03/10/24  5:29 PM   Specimen: BLOOD  Result Value Ref Range Status   Specimen Description BLOOD BLOOD LEFT ARM BLA  Final   Special Requests   Final    BOTTLES DRAWN AEROBIC AND ANAEROBIC Blood Culture adequate volume   Culture   Final    NO GROWTH 5 DAYS Performed at [facility], [address] [place], [place] [address]    Report Status 03/15/2024 FINAL  Final    Procedures and diagnostic studies:  No results found.              LOS: 9 days   Dyshon Philbin  [employer] loss adjuster on [email or web address]. If 7PM-7AM, please contact night-coverage at [email or web address]     03/15/2024, 1:37 PM

## 2024-03-15 NOTE — Plan of Care (Signed)

## 2024-03-16 DIAGNOSIS — K650 Generalized (acute) peritonitis: Secondary | ICD-10-CM | POA: Diagnosis not present

## 2024-03-16 NOTE — Progress Notes (Signed)
 Progress Note    [name] Young.  FMW:[id number] DOB: 28-Apr-1941  DOA: 03/06/2024 PCP: Rilla Baller, MD      Brief Narrative:    Medical records reviewed and are as summarized below:   [name] is a 83 year old male with history of atrial fibrillation on Eliquis , hypertension, chronic HFpEF, CAD s/p CABG x 3, CKD 3a, non-insulin -dependent diabetes mellitus, history of endocarditis with Streptococcus bacteremia, history of E. coli bacteremia, who presented to the ED for chief concerns of perihepatic abscess seen on outpatient imaging.  He had been experiencing right flank and upper back pain for several weeks.  Vitals in the ED showed t of 98.5, respiration rate of 23, heart rate 74, blood pressure 101/47, SpO2 100% on room air.  Serum sodium is 136, potassium 5.4, chloride 100, bicarb 24, BUN of 63, serum creatinine 1.82, EGFR 37, nonfasting blood glucose 176, WBC 12.6, hemoglobin 8.3, platelets of 432. Initial lactic acid was 2.6 and on repeat is 2.9.  Blood cultures were drawn.  UA was not consistent with UTI.  ED treatment: Cefepime  2 g IV, Flagyl  500 mg IV one-time doses, sodium chloride  500 mg bolus x 2.   He was found to have AKI, perihepatic abscess and Enterococcus faecalis bacteremia.  He was treated with empiric IV antibiotics.  He developed hypotension and acute blood loss anemia.  He was treated with IV fluids and was transfused with 3 units of packed red blood cells.      Assessment/Plan:   Principal Problem:   Perihepatic abscess (HCC) Active Problems:   AKI (acute kidney injury) (HCC)   Hyperkalemia   Anemia   PAF (paroxysmal atrial fibrillation) (HCC)   S/P CABG x 3   Essential hypertension   Hyperlipidemia associated with type 2 diabetes mellitus (HCC)   FTT (failure to thrive) in adult   General weakness   Obesity, Class I, BMI 30-34.9   DNR (do not resuscitate)   Insomnia   Malnutrition of moderate degree   Pressure  injury of skin   Bacteremia   Nutrition Problem: Moderate Malnutrition Etiology: chronic illness (CHF)  Signs/Symptoms: mild fat depletion, moderate fat depletion, mild muscle depletion, moderate muscle depletion, energy intake < 75% for > or equal to 1 month   Body mass index is 30.65 kg/m.  (Obesity)  Perihepatic abscess, Enterococcus faecalis bacteremia: S/p image guided drainage of abdominal wall abscess and hematoma on 03/07/2024.  Repeat CT abdomen shows improvement in fluid collection.  TEE on 03/12/2024 did not show any evidence of vegetations or endocarditis. Initially treated with broad-spectrum antibiotics (IV cefepime , IV metronidazole ).  Antibiotics de-escalated to IV daptomycin  then transitioned to Zosyn  and subsequently to Unasyn .  Plan to transition to Augmentin  on discharge. IR will reevaluate abdominal drain in 1 to 2 weeks Follow-up with ID specialist in 2 weeks   AKI and hyperkalemia: Resolved:   Hypotension: Improved.  Continue midodrine    Acute blood loss anemia: Hemoglobin improved.  S/p transfusion with 3 units of PRBCs.   Chronic HFpEF: Compensated.  He is tolerating diuretics.  Continue torsemide  and spironolactone .  Paroxysmal atrial fibrillation, CAD s/p CABG x 3: continue Eliquis    General Weakness: PT and OT recommending discharge to SNF.   Comorbidities include moderate malnutrition related to chronic illness, hyperlipidemia, type 2 diabetes mellitus, hypertension      Diet Order             Diet Carb Modified Fluid consistency: Thin; Room service appropriate? Yes  Diet effective now                            Consultants:  ID specialist Intervention radiologist  Procedures: CT and ultrasound-guided drainage of abdominal wall abscess/infected hematoma on 03/07/2024 TEE on 03/12/2024    Medications:    apixaban   5 mg Oral BID   vitamin C   500 mg Oral BID   cyanocobalamin   1,000 mcg Oral Q M,W,F   Fe Fum-Vit C-Vit  B12-FA  1 capsule Oral QPC breakfast   feeding supplement  237 mL Oral TID BM   Gerhardt's butt cream   Topical BID   midodrine   10 mg Oral TID WC   mirtazapine   30 mg Oral QHS   multivitamin with minerals  1 tablet Oral Daily   rosuvastatin   10 mg Oral QHS   sodium chloride  flush  5 mL Intracatheter Q8H   spironolactone   25 mg Oral Daily   tamsulosin   0.4 mg Oral QPC supper   torsemide   20 mg Oral QODAY   Continuous Infusions:  ampicillin -sulbactam (UNASYN ) IV 3 g (03/16/24 1232)     Anti-infectives (From admission, onward)    Start     Dose/Rate Route Frequency Ordered Stop   03/11/24 2200  Ampicillin -Sulbactam (UNASYN ) 3 g in sodium chloride  0.9 % 100 mL IVPB        3 g 200 mL/hr over 30 Minutes Intravenous Every 6 hours 03/11/24 1619     03/10/24 1800  piperacillin -tazobactam (ZOSYN ) IVPB 3.375 g        3.375 g 12.5 mL/hr over 240 Minutes Intravenous Every 8 hours 03/10/24 1649 03/11/24 1739   03/08/24 1100  DAPTOmycin  (CUBICIN ) 1,000 mg in sodium chloride  0.9 % IVPB  Status:  Discontinued        1,000 mg 140 mL/hr over 30 Minutes Intravenous Daily 03/08/24 0825 03/10/24 1649   03/07/24 0100  ceFEPIme  (MAXIPIME ) 2 g in sodium chloride  0.9 % 100 mL IVPB  Status:  Discontinued        2 g 200 mL/hr over 30 Minutes Intravenous Every 12 hours 03/06/24 1458 03/10/24 1649   03/06/24 2000  metroNIDAZOLE  (FLAGYL ) IVPB 500 mg  Status:  Discontinued        500 mg 100 mL/hr over 60 Minutes Intravenous Every 12 hours 03/06/24 1441 03/10/24 1649   03/06/24 1300  ceFEPIme  (MAXIPIME ) 2 g in sodium chloride  0.9 % 100 mL IVPB        2 g 200 mL/hr over 30 Minutes Intravenous  Once 03/06/24 1245 03/06/24 1410   03/06/24 1300  metroNIDAZOLE  (FLAGYL ) IVPB 500 mg        500 mg 100 mL/hr over 60 Minutes Intravenous  Once 03/06/24 1245 03/06/24 1622              Family Communication/Anticipated D/C date and plan/Code Status   DVT prophylaxis: Place TED hose Start: 03/06/24  1440 apixaban  (ELIQUIS ) tablet 5 mg     Code Status: Full Code  Family Communication: Plan discussed with his wife at the bedside. Disposition Plan: Plan to discharge to SNF   Status is: Inpatient Remains inpatient appropriate because: Perihepatic abscess and bacteremia       Subjective:   Interval events noted.  No complaints.  Wife at the bedside.  Objective:    Vitals:   03/16/24 0341 03/16/24 0500 03/16/24 0700 03/16/24 1441  BP: (!) 108/54  (!) 107/48 (!) 115/59  Pulse: (!) 41  66 73  Resp: 18  18 16   Temp: 97.8 F (36.6 C)  97.7 F (36.5 C) 98.1 F (36.7 C)  TempSrc:   Oral Oral  SpO2: 98%  99% 99%  Weight:  96.9 kg    Height:       No data found.   Intake/Output Summary (Last 24 hours) at 03/16/2024 1605 Last data filed at 03/16/2024 1500 Gross per 24 hour  Intake 5 ml  Output 2300 ml  Net -2295 ml   Filed Weights   03/12/24 1538 03/13/24 0500 03/16/24 0500  Weight: 93.5 kg 94.8 kg 96.9 kg    Exam:  GEN: NAD SKIN: Warm and dry EYES: No pallor or icterus ENT: MMM CV: RRR PULM: CTA B ABD: soft, ND, NT, +BS, + abdominal drain with bloody fluid CNS: AAO x 3, non focal EXT: No edema or tenderness      Data Reviewed:   I have personally reviewed following labs and imaging studies:  Labs: Labs show the following:   Basic Metabolic Panel: Recent Labs  Lab 03/11/24 0302 03/13/24 0436 03/15/24 0441  NA 139  --  140  K 4.8  --  4.2  CL 109  --  104  CO2 22  --  26  GLUCOSE 111*  --  98  BUN 29*  --  21  CREATININE 1.00  --  0.66  CALCIUM  8.4*  --  8.5*  MG  --  1.8  --   PHOS  --  2.9  --    GFR Estimated Creatinine Clearance: 84.6 mL/min (by C-G formula based on SCr of 0.66 mg/dL). Liver Function Tests: No results for input(s): AST, ALT, ALKPHOS, BILITOT, PROT, ALBUMIN  in the last 168 hours.  No results for input(s): LIPASE, AMYLASE in the last 168 hours. No results for input(s): AMMONIA in the last 168  hours. Coagulation profile No results for input(s): INR, PROTIME in the last 168 hours.   CBC: Recent Labs  Lab 03/11/24 0302  WBC 9.9  HGB 9.0*  HCT 30.7*  MCV 83.4  PLT 323   Cardiac Enzymes: No results for input(s): CKTOTAL, CKMB, CKMBINDEX, TROPONINI in the last 168 hours.  BNP (last 3 results) Recent Labs    04/25/23 1516  PROBNP 142.0*   CBG: Recent Labs  Lab 03/12/24 2054 03/13/24 0810 03/13/24 1207 03/13/24 1615  GLUCAP 155* 94 153* 146*   D-Dimer: No results for input(s): DDIMER in the last 72 hours. Hgb A1c: No results for input(s): HGBA1C in the last 72 hours. Lipid Profile: No results for input(s): CHOL, HDL, LDLCALC, TRIG, CHOLHDL, LDLDIRECT in the last 72 hours. Thyroid  function studies: No results for input(s): TSH, T4TOTAL, T3FREE, THYROIDAB in the last 72 hours.  Invalid input(s): FREET3 Anemia work up: No results for input(s): VITAMINB12, FOLATE, FERRITIN, TIBC, IRON, RETICCTPCT in the last 72 hours. Sepsis Labs: Recent Labs  Lab 03/11/24 0302  WBC 9.9    Microbiology Recent Results (from the past 240 hours)  Aerobic/Anaerobic Culture w Gram Stain (surgical/deep wound)     Status: None   Collection Time: 03/07/24  4:00 PM   Specimen: Abscess  Result Value Ref Range Status   Specimen Description   Final    ABSCESS Performed at [facility], [address] [place], [place] [address]    Special Requests   Final    NONE Performed at [facility], [address] [place], [place] [address]    Gram Stain   Final  MODERATE WBC PRESENT, PREDOMINANTLY PMN MODERATE GRAM POSITIVE COCCI RARE GRAM NEGATIVE RODS Performed at [facility], [address] [place], [place] [address]    Culture   Final    ABUNDANT ENTEROCOCCUS FAECALIS ABUNDANT ESCHERICHIA COLI ABUNDANT PROTEUS MIRABILIS NO ANAEROBES ISOLATED VANCOMYCIN  RESISTANT ENTEROCOCCUS ISOLATED     Report Status 03/12/2024 FINAL  Final   Organism ID, Bacteria ESCHERICHIA COLI  Final   Organism ID, Bacteria PROTEUS MIRABILIS  Final   Organism ID, Bacteria ENTEROCOCCUS FAECALIS  Final      Susceptibility   Escherichia coli - MIC*    AMPICILLIN  <=2 SENSITIVE Sensitive     CEFEPIME  <=0.12 SENSITIVE Sensitive     CEFTAZIDIME  <=1 SENSITIVE Sensitive     CEFTRIAXONE  <=0.25 SENSITIVE Sensitive     CIPROFLOXACIN  <=0.25 SENSITIVE Sensitive     GENTAMICIN <=1 SENSITIVE Sensitive     IMIPENEM <=0.25 SENSITIVE Sensitive     TRIMETH/SULFA <=20 SENSITIVE Sensitive     AMPICILLIN /SULBACTAM <=2 SENSITIVE Sensitive     PIP/TAZO <=4 SENSITIVE Sensitive ug/mL    * ABUNDANT ESCHERICHIA COLI   Enterococcus faecalis - MIC*    AMPICILLIN  <=2 SENSITIVE Sensitive     VANCOMYCIN  >=32 RESISTANT Resistant     GENTAMICIN SYNERGY SENSITIVE Sensitive     * ABUNDANT ENTEROCOCCUS FAECALIS   Proteus mirabilis - MIC*    AMPICILLIN  <=2 SENSITIVE Sensitive     CEFEPIME  <=0.12 SENSITIVE Sensitive     CEFTAZIDIME  <=1 SENSITIVE Sensitive     CEFTRIAXONE  <=0.25 SENSITIVE Sensitive     CIPROFLOXACIN  <=0.25 SENSITIVE Sensitive     GENTAMICIN <=1 SENSITIVE Sensitive     IMIPENEM <=0.25 SENSITIVE Sensitive     TRIMETH/SULFA <=20 SENSITIVE Sensitive     AMPICILLIN /SULBACTAM <=2 SENSITIVE Sensitive     PIP/TAZO <=4 SENSITIVE Sensitive ug/mL    * ABUNDANT PROTEUS MIRABILIS  Culture, blood (single) w Reflex to ID Panel     Status: None   Collection Time: 03/10/24  5:29 PM   Specimen: BLOOD  Result Value Ref Range Status   Specimen Description BLOOD BLOOD LEFT ARM BLA  Final   Special Requests   Final    BOTTLES DRAWN AEROBIC AND ANAEROBIC Blood Culture adequate volume   Culture   Final    NO GROWTH 5 DAYS Performed at [facility], [address], [place], [place] [address]    Report Status 03/15/2024 FINAL  Final    Procedures and diagnostic studies:  No results found.              LOS:  10 days   Parthena Fergeson  [facility]   Pager on [email or web address]. If 7PM-7AM, please contact night-coverage at [email or web address]     03/16/2024, 4:05 PM

## 2024-03-16 NOTE — Plan of Care (Signed)

## 2024-03-16 NOTE — Plan of Care (Signed)

## 2024-03-17 DIAGNOSIS — K750 Abscess of liver: Secondary | ICD-10-CM | POA: Diagnosis not present

## 2024-03-17 DIAGNOSIS — Z833 Family history of diabetes mellitus: Secondary | ICD-10-CM | POA: Diagnosis not present

## 2024-03-17 DIAGNOSIS — I509 Heart failure, unspecified: Secondary | ICD-10-CM | POA: Diagnosis not present

## 2024-03-17 DIAGNOSIS — E440 Moderate protein-calorie malnutrition: Secondary | ICD-10-CM | POA: Diagnosis present

## 2024-03-17 DIAGNOSIS — I499 Cardiac arrhythmia, unspecified: Secondary | ICD-10-CM | POA: Diagnosis not present

## 2024-03-17 DIAGNOSIS — Z743 Need for continuous supervision: Secondary | ICD-10-CM | POA: Diagnosis not present

## 2024-03-17 DIAGNOSIS — E1169 Type 2 diabetes mellitus with other specified complication: Secondary | ICD-10-CM | POA: Diagnosis not present

## 2024-03-17 DIAGNOSIS — R001 Bradycardia, unspecified: Secondary | ICD-10-CM | POA: Diagnosis not present

## 2024-03-17 DIAGNOSIS — Z741 Need for assistance with personal care: Secondary | ICD-10-CM | POA: Diagnosis not present

## 2024-03-17 DIAGNOSIS — F32A Depression, unspecified: Secondary | ICD-10-CM | POA: Diagnosis not present

## 2024-03-17 DIAGNOSIS — Z87891 Personal history of nicotine dependence: Secondary | ICD-10-CM | POA: Diagnosis not present

## 2024-03-17 DIAGNOSIS — R7881 Bacteremia: Secondary | ICD-10-CM | POA: Diagnosis not present

## 2024-03-17 DIAGNOSIS — Z66 Do not resuscitate: Secondary | ICD-10-CM | POA: Diagnosis present

## 2024-03-17 DIAGNOSIS — Z7901 Long term (current) use of anticoagulants: Secondary | ICD-10-CM | POA: Diagnosis not present

## 2024-03-17 DIAGNOSIS — D519 Vitamin B12 deficiency anemia, unspecified: Secondary | ICD-10-CM | POA: Diagnosis present

## 2024-03-17 DIAGNOSIS — M6281 Muscle weakness (generalized): Secondary | ICD-10-CM | POA: Diagnosis not present

## 2024-03-17 DIAGNOSIS — Z8249 Family history of ischemic heart disease and other diseases of the circulatory system: Secondary | ICD-10-CM | POA: Diagnosis not present

## 2024-03-17 DIAGNOSIS — I5022 Chronic systolic (congestive) heart failure: Secondary | ICD-10-CM | POA: Diagnosis not present

## 2024-03-17 DIAGNOSIS — R2689 Other abnormalities of gait and mobility: Secondary | ICD-10-CM | POA: Diagnosis not present

## 2024-03-17 DIAGNOSIS — E785 Hyperlipidemia, unspecified: Secondary | ICD-10-CM | POA: Diagnosis present

## 2024-03-17 DIAGNOSIS — E875 Hyperkalemia: Secondary | ICD-10-CM | POA: Diagnosis not present

## 2024-03-17 DIAGNOSIS — E1122 Type 2 diabetes mellitus with diabetic chronic kidney disease: Secondary | ICD-10-CM | POA: Diagnosis present

## 2024-03-17 DIAGNOSIS — K625 Hemorrhage of anus and rectum: Secondary | ICD-10-CM | POA: Diagnosis present

## 2024-03-17 DIAGNOSIS — I480 Paroxysmal atrial fibrillation: Secondary | ICD-10-CM | POA: Diagnosis present

## 2024-03-17 DIAGNOSIS — I255 Ischemic cardiomyopathy: Secondary | ICD-10-CM | POA: Diagnosis present

## 2024-03-17 DIAGNOSIS — Z800 Family history of malignant neoplasm of digestive organs: Secondary | ICD-10-CM | POA: Diagnosis not present

## 2024-03-17 DIAGNOSIS — I130 Hypertensive heart and chronic kidney disease with heart failure and stage 1 through stage 4 chronic kidney disease, or unspecified chronic kidney disease: Secondary | ICD-10-CM | POA: Diagnosis present

## 2024-03-17 DIAGNOSIS — N1831 Chronic kidney disease, stage 3a: Secondary | ICD-10-CM | POA: Diagnosis present

## 2024-03-17 DIAGNOSIS — K5731 Diverticulosis of large intestine without perforation or abscess with bleeding: Secondary | ICD-10-CM | POA: Diagnosis present

## 2024-03-17 DIAGNOSIS — K922 Gastrointestinal hemorrhage, unspecified: Secondary | ICD-10-CM | POA: Diagnosis not present

## 2024-03-17 DIAGNOSIS — L8990 Pressure ulcer of unspecified site, unspecified stage: Secondary | ICD-10-CM | POA: Diagnosis not present

## 2024-03-17 DIAGNOSIS — I5032 Chronic diastolic (congestive) heart failure: Secondary | ICD-10-CM | POA: Diagnosis present

## 2024-03-17 DIAGNOSIS — R578 Other shock: Secondary | ICD-10-CM | POA: Diagnosis not present

## 2024-03-17 DIAGNOSIS — I4891 Unspecified atrial fibrillation: Secondary | ICD-10-CM | POA: Diagnosis not present

## 2024-03-17 DIAGNOSIS — Z951 Presence of aortocoronary bypass graft: Secondary | ICD-10-CM | POA: Diagnosis not present

## 2024-03-17 DIAGNOSIS — I2510 Atherosclerotic heart disease of native coronary artery without angina pectoris: Secondary | ICD-10-CM | POA: Diagnosis present

## 2024-03-17 DIAGNOSIS — K650 Generalized (acute) peritonitis: Secondary | ICD-10-CM | POA: Diagnosis not present

## 2024-03-17 DIAGNOSIS — Z5189 Encounter for other specified aftercare: Secondary | ICD-10-CM | POA: Diagnosis not present

## 2024-03-17 DIAGNOSIS — R41841 Cognitive communication deficit: Secondary | ICD-10-CM | POA: Diagnosis not present

## 2024-03-17 DIAGNOSIS — Z823 Family history of stroke: Secondary | ICD-10-CM | POA: Diagnosis not present

## 2024-03-17 DIAGNOSIS — Z7982 Long term (current) use of aspirin: Secondary | ICD-10-CM | POA: Diagnosis not present

## 2024-03-17 DIAGNOSIS — N179 Acute kidney failure, unspecified: Secondary | ICD-10-CM | POA: Diagnosis not present

## 2024-03-17 DIAGNOSIS — D62 Acute posthemorrhagic anemia: Secondary | ICD-10-CM | POA: Diagnosis present

## 2024-03-17 DIAGNOSIS — I959 Hypotension, unspecified: Secondary | ICD-10-CM | POA: Diagnosis not present

## 2024-03-17 DIAGNOSIS — E66811 Obesity, class 1: Secondary | ICD-10-CM | POA: Diagnosis present

## 2024-03-17 DIAGNOSIS — R000 Tachycardia, unspecified: Secondary | ICD-10-CM | POA: Diagnosis not present

## 2024-03-17 DIAGNOSIS — N400 Enlarged prostate without lower urinary tract symptoms: Secondary | ICD-10-CM | POA: Diagnosis present

## 2024-03-17 DIAGNOSIS — L02211 Cutaneous abscess of abdominal wall: Secondary | ICD-10-CM | POA: Diagnosis not present

## 2024-03-17 MED ORDER — AMOXICILLIN-POT CLAVULANATE 875-125 MG PO TABS: 1.0000 | ORAL_TABLET | Freq: Two times a day (BID) | ORAL | Status: AC

## 2024-03-17 MED ORDER — POLYETHYLENE GLYCOL 3350 17 GM/SCOOP PO POWD: 8.5000 g | Freq: Every day | ORAL | Status: DC | PRN

## 2024-03-17 MED ORDER — MIDODRINE HCL 10 MG PO TABS: 10.0000 mg | ORAL_TABLET | Freq: Three times a day (TID) | ORAL | Status: DC

## 2024-03-17 MED ORDER — GERHARDT'S BUTT CREAM: 1.0000 | TOPICAL_CREAM | Freq: Two times a day (BID) | CUTANEOUS | Status: AC

## 2024-03-17 NOTE — Plan of Care (Signed)
  Problem: Health Behavior/Discharge Planning: Goal: Ability to manage health-related needs will improve Outcome: Progressing   Problem: Education: Goal: Knowledge of General Education information will improve Description: Including pain rating scale, medication(s)/side effects and non-pharmacologic comfort measures Outcome: Progressing   Problem: Clinical Measurements: Goal: Ability to maintain clinical measurements within normal limits will improve Outcome: Progressing Goal: Will remain free from infection Outcome: Progressing Goal: Diagnostic test results will improve Outcome: Progressing Goal: Respiratory complications will improve Outcome: Progressing Goal: Cardiovascular complication will be avoided Outcome: Progressing

## 2024-03-17 NOTE — Discharge Summary (Signed)
 Physician Discharge Summary   Patient: [name] Young. MRN: [id number] DOB: 27-Jan-1941  Admit date:     03/06/2024  Discharge date: 03/17/24  Discharge Physician: AIDA CHO   PCP: Rilla Baller, MD   Recommendations at discharge:   Follow-up with [facility] interventional radiology in 1 week for repeat imaging and abdominal drain management Follow-up with Dr. Epifanio, ID specialist, in 1 week  Discharge Diagnoses: Principal Problem:   Perihepatic abscess (HCC) Active Problems:   AKI (acute kidney injury) (HCC)   Hyperkalemia   Anemia   PAF (paroxysmal atrial fibrillation) (HCC)   S/P CABG x 3   Essential hypertension   Hyperlipidemia associated with type 2 diabetes mellitus (HCC)   FTT (failure to thrive) in adult   General weakness   Obesity, Class I, BMI 30-34.9   DNR (do not resuscitate)   Insomnia   Malnutrition of moderate degree   Pressure injury of skin   Bacteremia  Resolved Problems:   * No resolved hospital problems. [facility] Course:  [name] is a 83 year old male with history of atrial fibrillation on Eliquis , hypertension, chronic HFpEF, CAD s/p CABG x 3, CKD 3a, non-insulin -dependent diabetes mellitus, history of endocarditis with Streptococcus bacteremia, history of E. coli bacteremia, who presented to the ED for chief concerns of perihepatic abscess seen on outpatient imaging.  He had been experiencing right flank and upper back pain for several weeks.   Vitals in the ED showed t of 98.5, respiration rate of 23, heart rate 74, blood pressure 101/47, SpO2 100% on room air.   Serum sodium is 136, potassium 5.4, chloride 100, bicarb 24, BUN of 63, serum creatinine 1.82, EGFR 37, nonfasting blood glucose 176, WBC 12.6, hemoglobin 8.3, platelets of 432. Initial lactic acid was 2.6 and on repeat is 2.9.  Blood cultures were drawn.  UA was not consistent with UTI.   ED treatment: Cefepime  2 g IV, Flagyl  500 mg IV one-time doses, sodium  chloride 500 mg bolus x 2.     He was found to have AKI, perihepatic abscess and Enterococcus faecalis bacteremia.  He was treated with empiric IV antibiotics.   He developed hypotension and acute blood loss anemia.  He was treated with IV fluids and was transfused with 3 units of packed red blood cells.       Assessment and Plan:   Perihepatic abscess, Enterococcus faecalis bacteremia: S/p image guided drainage of abdominal wall abscess and hematoma on 03/07/2024.  Repeat CT abdomen shows improvement in fluid collection.  TEE on 03/12/2024 did not show any evidence of vegetations or endocarditis. Initially treated with broad-spectrum antibiotics (IV cefepime , IV metronidazole ).  Antibiotics de-escalated to IV daptomycin  then transitioned to Zosyn  and subsequently to Unasyn .  He will be discharged on Augmentin .  Duration of antibiotics to be determined by Dr. Epifanio, ID specialist, at follow-up visit.  In the meantime, patient will be given adequate supply of Augmentin  for 2 weeks. Follow-up with interventional radiologist to reevaluate abdominal drain in 1 week Follow-up with ID specialist in 1 week     AKI and hyperkalemia: Resolved:     Hypotension: Improved.  Continue midodrine      Acute blood loss anemia: Hemoglobin improved.  S/p transfusion with 3 units of PRBCs.     Chronic HFpEF: Compensated.  Continue torsemide  and spironolactone .  Paroxysmal atrial fibrillation, CAD s/p CABG x 3: continue Eliquis      General Weakness: PT and OT recommending discharge to SNF.  Comorbidities include moderate malnutrition related to chronic illness, hyperlipidemia, type 2 diabetes mellitus, hypertension     His condition has improved and he is deemed stable for discharge to SNF today. Plan discussed with patient and his wife at the bedside          Consultants: ID specialist, interventional radiologist Procedures performed: CT and ultrasound-guided drainage of abdominal  wall abscess/infected hematoma on 03/07/2024 TEE on 03/12/2024  Disposition: Skilled nursing facility Diet recommendation:  Cardiac and Carb modified diet DISCHARGE MEDICATION: Allergies as of 03/17/2024   No Known Allergies      Medication List     TAKE these medications    amoxicillin -clavulanate 875-125 MG tablet Commonly known as: AUGMENTIN  Take 1 tablet by mouth 2 (two) times daily for 14 days.   aspirin  EC 81 MG tablet Take 81 mg by mouth daily. Swallow whole.   B-12 1000 MCG Caps Take 1 capsule by mouth every Monday, Wednesday, and Friday.   bisacodyl  5 MG EC tablet Commonly known as: DULCOLAX Take 10 mg by mouth at bedtime.   Eliquis  5 MG Tabs tablet Generic drug: apixaban  TAKE 1 TABLET BY MOUTH TWICE A DAY   feeding supplement Liqd Take 237 mLs by mouth 2 (two) times daily between meals. What changed: when to take this   ferrous sulfate  325 (65 FE) MG EC tablet Take 1 tablet (325 mg total) by mouth daily.   Gerhardt's butt cream Crea Apply 1 Application topically 2 (two) times daily for 3 days.   lisinopril  2.5 MG tablet Commonly known as: ZESTRIL  Take 2.5 mg by mouth daily.   Magnesium  250 MG Tabs Take 250 mg by mouth daily.   metoprolol  tartrate 25 MG tablet Commonly known as: LOPRESSOR  Take 0.5 tablets (12.5 mg total) by mouth 2 (two) times daily.   midodrine  10 MG tablet Commonly known as: PROAMATINE  Take 1 tablet (10 mg total) by mouth 3 (three) times daily with meals.   mirtazapine  30 MG tablet Commonly known as: REMERON  Take 1 tablet (30 mg total) by mouth at bedtime.   polyethylene glycol powder 17 GM/SCOOP powder Commonly known as: GLYCOLAX /MIRALAX  Take 8.5-17 g by mouth daily as needed for moderate constipation.   PRESERVISION AREDS 2 PO Take 1 capsule by mouth daily.   rosuvastatin  10 MG tablet Commonly known as: CRESTOR  TAKE 1 TABLET BY MOUTH EVERY DAY   spironolactone  25 MG tablet Commonly known as: ALDACTONE  Take 25 mg by  mouth daily.   tamsulosin  0.4 MG Caps capsule Commonly known as: FLOMAX  Take 1 capsule (0.4 mg total) by mouth daily.   torsemide  20 MG tablet Commonly known as: DEMADEX  Take 1 tablet (20 mg total) by mouth every other day.   True Metrix Blood Glucose Test test strip Generic drug: glucose blood Use as instructed to check blood sugar once a day   Vitamin D3 25 MCG (1000 UT) Caps Take 1 capsule (1,000 Units total) by mouth daily.               Discharge Care Instructions  (From admission, onward)           Start     Ordered   03/17/24 0000  Discharge wound care:       Comments: Keep wound clean and dr Apply Mepilex border to buttock wound   03/17/24 1027            Contact information for follow-up providers     [facility] Follow up in 2  week(s).   Specialty: Radiology Why: IR scheduler will call with appoinment date and time. Please call [phone] or after hours number [phone] with any questions or concerns. Contact information: [address] [place]  [address] [phone]        Epifanio Alm SQUIBB, MD. Schedule an appointment as soon as possible for a visit in 10 day(s).   Specialty: Infectious Diseases Contact information: [address] [place] [address] [phone]              Contact information for after-discharge care     Destination     [facility] .   Service: Skilled Nursing Contact information: [address] [place]  [id number] [phone]                    Discharge Exam: [name] Young   03/13/24 0500 03/16/24 0500 03/17/24 0559  Weight: 94.8 kg 96.9 kg 95.5 kg   GEN: NAD SKIN: Warm and dry EYES: No pallor or icterus ENT: MMM CV: RRR PULM: CTA B ABD: soft, ND, NT, +BS CNS: AAO x 3, non focal EXT: No edema or tenderness   Condition at discharge: good  The results of significant  diagnostics from this hospitalization (including imaging, microbiology, ancillary and laboratory) are listed below for reference.   Imaging Studies: ECHO TEE Result Date: 03/12/2024    TRANSESOPHOGEAL ECHO REPORT   Patient Name:   [name] Young. Date of Exam: 03/12/2024 Medical Rec #:  [id number]            Height:       70.0 in Accession #:    [id number]           Weight:       216.3 lb Date of Birth:  05/09/1941           BSA:          2.158 m Patient Age:    82 years             BP:           104/63 mmHg Patient Gender: M                    HR:           89 bpm. Exam Location:  [facility] Procedure: Transesophageal Echo, Color Doppler and Cardiac Doppler (Both            Spectral and Color Flow Doppler were utilized during procedure). Indications:     Bacteremia R78.81  History:         Patient has prior history of Echocardiogram examinations, most                  recent 03/11/2024. CAD, Prior CABG; Signs/Symptoms:Bacteremia.  Sonographer:     Ashley McNeely-Sloane Referring Phys:  [id number] [facility] CARE[facility] Diagnosing Phys: Redell Cave MD PROCEDURE: The transesophogeal probe was passed without difficulty through the esophogus of the patient. Sedation performed by different physician. The patient developed no complications during the procedure.  IMPRESSIONS  1. Left ventricular ejection fraction, by estimation, is 55 to 60%. The left ventricle has normal function.  2. Right ventricular systolic function is normal. The right ventricular size is normal.  3. No left atrial/left atrial appendage thrombus was detected.  4. The mitral valve is normal in structure. Mild mitral valve regurgitation.  5. The aortic valve is tricuspid. Aortic valve regurgitation is  mild. Aortic valve sclerosis/calcification is present, without any evidence of aortic stenosis. Conclusion(s)/Recommendation(s): No evidence of vegetation/infective endocarditis on this transesophageael echocardiogram. FINDINGS  Left Ventricle: Left ventricular  ejection fraction, by estimation, is 55 to 60%. The left ventricle has normal function. The left ventricular internal cavity size was normal in size. Right Ventricle: The right ventricular size is normal. No increase in right ventricular wall thickness. Right ventricular systolic function is normal. Left Atrium: Left atrial size was normal in size. No left atrial/left atrial appendage thrombus was detected. Right Atrium: Right atrial size was normal in size. Pericardium: There is no evidence of pericardial effusion. Mitral Valve: The mitral valve is normal in structure. Mild mitral valve regurgitation. Tricuspid Valve: The tricuspid valve is normal in structure. Tricuspid valve regurgitation is mild. Aortic Valve: The aortic valve is tricuspid. Aortic valve regurgitation is mild. Aortic valve sclerosis/calcification is present, without any evidence of aortic stenosis. Pulmonic Valve: The pulmonic valve was normal in structure. Pulmonic valve regurgitation is trivial. Aorta: The aortic root is normal in size and structure. IAS/Shunts: No atrial level shunt detected by color flow Doppler. Redell Cave MD Electronically signed by Redell Cave MD Signature Date/Time: 03/12/2024/2:49:23 PM    Final    ECHOCARDIOGRAM COMPLETE Result Date: 03/11/2024    ECHOCARDIOGRAM REPORT   Patient Name:   [name] Young. Date of Exam: 03/11/2024 Medical Rec #:  [id number]            Height:       70.0 in Accession #:    [id number]           Weight:       216.3 lb Date of Birth:  November 06, 1940           BSA:          2.158 m Patient Age:    82 years             BP:           104/61 mmHg Patient Gender: M                    HR:           67 bpm. Exam Location:  [facility] Procedure: 2D Echo, Cardiac Doppler and Color Doppler (Both Spectral and Color            Flow Doppler were utilized during procedure). Indications:     Bacteremia R78.81  History:         Patient has prior history of Echocardiogram examinations, most                   recent 11/22/2023.  Sonographer:     [name] Furnace Referring Phys:  [id number] DAVID P FITZGERALD Diagnosing Phys: Lonni Hanson MD IMPRESSIONS  1. Left ventricular ejection fraction, by estimation, is 55 to 60%. The left ventricle has normal function. Left ventricular endocardial border not optimally defined to evaluate regional wall motion. There is mild left ventricular hypertrophy. Left ventricular diastolic parameters are consistent with Grade I diastolic dysfunction (impaired relaxation).  2. Right ventricular systolic function is normal. The right ventricular size is normal. Mildly increased right ventricular wall thickness.  3. The mitral valve is abnormal. Trivial mitral valve regurgitation. No evidence of mitral stenosis.  4. The aortic valve has an indeterminant number of cusps. There is mild thickening of the aortic valve. Aortic valve regurgitation is mild. Mild to moderate aortic valve stenosis. Aortic valve area, by VTI measures  1.53 cm. Aortic valve mean gradient measures 16.0 mmHg.  5. Aortic dilatation noted. There is borderline dilatation of the aortic root, measuring 40 mm. Comparison(s): A prior study was performed on 11/22/2023. No significant change from prior study. FINDINGS  Left Ventricle: Left ventricular ejection fraction, by estimation, is 55 to 60%. The left ventricle has normal function. Left ventricular endocardial border not optimally defined to evaluate regional wall motion. The left ventricular internal cavity size was normal in size. There is mild left ventricular hypertrophy. Left ventricular diastolic parameters are consistent with Grade I diastolic dysfunction (impaired relaxation). Right Ventricle: The right ventricular size is normal. Mildly increased right ventricular wall thickness. Right ventricular systolic function is normal. Left Atrium: Left atrial size was normal in size. Right Atrium: Right atrial size was normal in size. Pericardium: The pericardium was not well  visualized. Mitral Valve: The mitral valve is abnormal. There is severe thickening of the mitral valve leaflet(s). Mild mitral annular calcification. Trivial mitral valve regurgitation. No evidence of mitral valve stenosis. MV peak gradient, 5.7 mmHg. The mean mitral valve gradient is 2.0 mmHg. Tricuspid Valve: The tricuspid valve is not well visualized. Tricuspid valve regurgitation is not demonstrated. Aortic Valve: The aortic valve has an indeterminant number of cusps. There is mild thickening of the aortic valve. Aortic valve regurgitation is mild. Mild to moderate aortic stenosis is present. Aortic valve mean gradient measures 16.0 mmHg. Aortic valve peak gradient measures 27.7 mmHg. Aortic valve area, by VTI measures 1.53 cm. Pulmonic Valve: The pulmonic valve was not well visualized. Pulmonic valve regurgitation is not visualized. No evidence of pulmonic stenosis. Aorta: Aortic dilatation noted. There is borderline dilatation of the aortic root, measuring 40 mm. Pulmonary Artery: The pulmonary artery is not well seen. Venous: The inferior vena cava was not well visualized. IAS/Shunts: The interatrial septum was not well visualized.  LEFT VENTRICLE PLAX 2D LVIDd:         5.20 cm   Diastology LVIDs:         3.60 cm   LV e' medial:    5.87 cm/s LV PW:         1.20 cm   LV E/e' medial:  12.2 LV IVS:        1.20 cm   LV e' lateral:   7.29 cm/s LVOT diam:     2.20 cm   LV E/e' lateral: 9.8 LV SV:         76 LV SV Index:   35 LVOT Area:     3.80 cm  RIGHT VENTRICLE RV Basal diam:  2.80 cm RV Mid diam:    2.00 cm RV S prime:     12.30 cm/s TAPSE (M-mode): 2.4 cm LEFT ATRIUM             Index        RIGHT ATRIUM           Index LA diam:        3.60 cm 1.67 cm/m   RA Area:     12.30 cm LA Vol (A2C):   61.0 ml 28.27 ml/m  RA Volume:   24.70 ml  11.45 ml/m LA Vol (A4C):   50.2 ml 23.26 ml/m LA Biplane Vol: 56.9 ml 26.37 ml/m  AORTIC VALVE AV Area (Vmax):    1.32 cm AV Area (Vmean):   1.26 cm AV Area (VTI):      1.53 cm AV Vmax:           263.00  cm/s AV Vmean:          184.667 cm/s AV VTI:            0.498 m AV Peak Grad:      27.7 mmHg AV Mean Grad:      16.0 mmHg LVOT Vmax:         91.00 cm/s LVOT Vmean:        61.300 cm/s LVOT VTI:          0.200 m LVOT/AV VTI ratio: 0.40  AORTA Ao Root diam: 4.00 cm MITRAL VALVE                TRICUSPID VALVE MV Area (PHT): 2.38 cm     TR Peak grad:   33.4 mmHg MV Area VTI:   2.71 cm     TR Vmax:        289.00 cm/s MV Peak grad:  5.7 mmHg MV Mean grad:  2.0 mmHg     SHUNTS MV Vmax:       1.19 m/s     Systemic VTI:  0.20 m MV Vmean:      68.4 cm/s    Systemic Diam: 2.20 cm MV Decel Time: 319 msec MV E velocity: 71.80 cm/s MV A velocity: 128.00 cm/s MV E/A ratio:  0.56 Lonni End MD Electronically signed by Lonni Hanson MD Signature Date/Time: 03/11/2024/3:15:40 PM    Final    CT ANGIO ABDOMEN W &/OR WO CONTRAST Result Date: 03/08/2024 CLINICAL DATA:  Left upper quadrant pain. Perihepatic drain with active bleeding. EXAM: CT ANGIOGRAPHY ABDOMEN TECHNIQUE: Multidetector CT imaging of the abdomen was performed using the standard protocol during bolus administration of intravenous contrast. Multiplanar reconstructed images and MIPs were obtained and reviewed to evaluate the vascular anatomy. RADIATION DOSE REDUCTION: This exam was performed according to the departmental dose-optimization program which includes automated exposure control, adjustment of the mA and/or kV according to patient size and/or use of iterative reconstruction technique. CONTRAST:  OMNIPAQUE  IOHEXOL  350 MG/ML SOLN COMPARISON:  03/06/2024. FINDINGS: VASCULAR Aorta: Normal caliber aorta without aneurysm, dissection, vasculitis or significant stenosis. Aortic atherosclerosis. Celiac: Patent without evidence of aneurysm, dissection, vasculitis or significant stenosis. SMA: Patent without evidence of aneurysm, dissection, vasculitis or significant stenosis. Renals: Both renal arteries are patent without  evidence of aneurysm, dissection, vasculitis, fibromuscular dysplasia or significant stenosis. IMA: Patent without evidence of aneurysm, dissection, vasculitis or significant stenosis. Inflow: Patent without evidence of aneurysm, dissection, vasculitis or significant stenosis. Veins: No obvious venous abnormality within the limitations of this arterial phase study. Review of the MIP images confirms the above findings. NON-VASCULAR Lower chest: There is a trace right pleural effusion with with associated pleural thickening, unchanged. Atelectasis is noted at the lung bases bilaterally. Coronary artery calcifications are noted. Hepatobiliary: No focal abnormality is seen within the liver. The gallbladder is surgically absent. No biliary ductal dilatation is seen. There is a perihepatic complex collection with a drain terminating in the posterior perihepatic space. The collection continues into the lateral abdominal wall on the right. No evidence of contrast extravasation is seen to suggest active hemorrhage. Pancreas: Unremarkable. No pancreatic ductal dilatation or surrounding inflammatory changes. Spleen: Normal in size without focal abnormality. Adrenals/Urinary Tract: The adrenal glands are within normal limits. The kidneys enhance symmetrically. Renal calculi are noted on the right. Hypodensities are noted in the kidneys bilaterally, likely cysts. No hydronephrosis bilaterally. Stomach/Bowel: There is a small hiatal hernia. No bowel obstruction, free air, or pneumatosis is seen. Scattered  diverticula are present along the colon without evidence of diverticulitis. Lymphatic: Prominent lymph node is noted at the porta pedis, likely reactive. Other: Fat stranding is noted in the abdomen with a small amount of perihepatic free fluid. There is subcutaneous fat stranding and skin thickening along the right lateral abdominal wall. Musculoskeletal: Strongly wires are noted. Degenerative changes are present in the  thoracolumbar spine. No acute osseous abnormality is seen. IMPRESSION: VASCULAR 1. No evidence of active hemorrhage. 2. Aortic atherosclerosis. NON-VASCULAR 1. Complex collection at the right lung base extending into the. Paddock space and right abdominal wall, decreased in size from the prior exam. Percutaneous drain placement is noted posteriorly. No active hemorrhage is seen. 2. Remaining findings are unchanged. Electronically Signed   By: Leita Birmingham M.D.   On: 03/08/2024 19:07   CT GUIDED PERITONEAL/RETROPERITONEAL FLUID DRAIN BY PERC CATH Result Date: 03/07/2024 INDICATION: 83 year old with history of right hemithorax and now has a large complex fluid collection in the right lateral abdominal wall and right perihepatic space. EXAM: CT and ultrasound-guided placement of drainage catheter in the right lateral abdominal wall collection TECHNIQUE: Multidetector CT imaging of the abdomen was performed following the standard protocol without IV contrast. RADIATION DOSE REDUCTION: This exam was performed according to the departmental dose-optimization program which includes automated exposure control, adjustment of the mA and/or kV according to patient size and/or use of iterative reconstruction technique. MEDICATIONS: Moderate sedation ANESTHESIA/SEDATION: Moderate (conscious) sedation was employed during this procedure. A total of Versed  2 mg and Fentanyl  100 mcg was administered intravenously by the radiology nurse. Total intra-service moderate Sedation Time: 14 minutes. The patient's level of consciousness and vital signs were monitored continuously by radiology nursing throughout the procedure under my direct supervision. COMPLICATIONS: None immediate. PROCEDURE: Informed written consent was obtained from the patient after a thorough discussion of the procedural risks, benefits and alternatives. All questions were addressed. Maximal Sterile Barrier Technique was utilized including caps, mask, sterile gowns,  sterile gloves, sterile drape, hand hygiene and skin antiseptic. A timeout was performed prior to the initiation of the procedure. Patient was placed on his left side. CT images through the abdomen were obtained. Complex collection involving the lateral right abdominal wall and perihepatic space was identified. This area was also evaluated with ultrasound. The right lateral abdomen was prepped with chlorhexidine  and sterile field was created. Skin was anesthetized using 1% lidocaine . Small incision was made. Using ultrasound guidance, 18 gauge trocar needle was directed into the lateral superficial component. Yellow purulent fluid was aspirated. Superstiff Amplatz wire was advanced into the collection. Wire appeared to be extending into the perihepatic region as well. Tract was dilated to accommodate a 12 [place] multipurpose drain. Initially, a yellow purulent fluid was removed and eventually turned into bloody fluid. Total of 450 mL of bloody purulent fluid was removed. Follow up CT and ultrasound images were obtained. Drain was sutured to skin and attached to a suction bulb. FINDINGS: Large complex fluid collection involving the lateral right abdominal wall and right perihepatic space. Twelve French drain was placed in the superficial component of the collection and extends into the perihepatic region. 450 mL of bloody purulent fluid was removed. Follow up CT and ultrasound images demonstrate residual hematoma but no other significant drainable fluid. IMPRESSION: CT and ultrasound-guided placement of a drainage catheter in the right lateral abdominal wall and right perihepatic fluid collection. 450 mL of bloody purulent fluid was removed and findings are suggestive for an infected hematoma. Electronically Signed  By: Juliene Balder M.D.   On: 03/07/2024 16:32   IR PATIENT EVAL TECH 0-60 MINS Result Date: 03/07/2024 Delight Bebe SAILOR     03/07/2024  4:05 PM Assistance with CT drain placement  CT ABDOMEN PELVIS W  CONTRAST Result Date: 03/06/2024 CLINICAL DATA:  Perihepatic abscess. EXAM: CT ABDOMEN AND PELVIS WITH CONTRAST TECHNIQUE: Multidetector CT imaging of the abdomen and pelvis was performed using the standard protocol following bolus administration of intravenous contrast. RADIATION DOSE REDUCTION: This exam was performed according to the departmental dose-optimization program which includes automated exposure control, adjustment of the mA and/or kV according to patient size and/or use of iterative reconstruction technique. CONTRAST:  75mL OMNIPAQUE  IOHEXOL  300 MG/ML  SOLN COMPARISON:  CT chest and pelvis dated 02/25/2024. FINDINGS: Lower chest: Similar appearance of small right pleural effusion and right posterior pleural thickening or mass. Additional subpleural nodule in the right middle lobe similar to prior CT. Coronary vascular calcification. No intra-abdominal free air.  Small perihepatic free fluid. Hepatobiliary: Complex collection extending from the subcapsular posterior right lobe of the liver into the peritoneal surface posteriorly as well as posterolateral abdominal wall measures approximately 9.6 x 5.4 cm in greatest axial dimensions posterior to the liver and 14 x 7 cm in the right lateral abdominal wall and overall slightly increased in size since the prior CT. No biliary dilatation. Cholecystectomy. Pancreas: Unremarkable. No pancreatic ductal dilatation or surrounding inflammatory changes. Spleen: Normal in size without focal abnormality. Adrenals/Urinary Tract: The adrenal glands unremarkable. Right renal nonobstructing calculi measure up to 5 mm in the interpolar right kidney. No hydronephrosis. Bilateral renal cysts. The visualized ureters and urinary bladder appear unremarkable. Stomach/Bowel: Small hiatal hernia. There is moderate stool throughout the colon. There is sigmoid diverticulosis. There is no bowel obstruction or active inflammation. The appendix is not visualized with certainty. No  inflammatory changes identified in the right lower quadrant. Vascular/Lymphatic: Moderate aortoiliac atherosclerotic disease. The IVC is unremarkable. No portal venous gas. There is no adenopathy. Reproductive: The prostate and seminal vesicles are grossly unremarkable. Other: Small fat containing bilateral inguinal hernia. Musculoskeletal: Osteopenia with degenerative changes of the spine. No acute osseous pathology. IMPRESSION: 1. Slightly increased size of the complex collection extending from the subcapsular posterior right lobe of the liver into the posterior peritoneal surface and posterolateral abdominal wall. 2. Similar appearance of small right pleural effusion and right posterior pleural thickening or mass. 3. Sigmoid diverticulosis. No bowel obstruction. 4. Right renal nonobstructing calculi. No hydronephrosis. 5.  Aortic Atherosclerosis (ICD10-I70.0). Electronically Signed   By: Vanetta Chou M.D.   On: 03/06/2024 14:34   DG Chest Port 1 View Result Date: 03/06/2024 CLINICAL DATA:  Sepsis. EXAM: PORTABLE CHEST 1 VIEW COMPARISON:  December 06, 2023. FINDINGS: The heart size and mediastinal contours are within normal limits. Status post coronary artery bypass graft. Both lungs are clear. The visualized skeletal structures are unremarkable. IMPRESSION: No active disease. Electronically Signed   By: Lynwood Landy Raddle M.D.   On: 03/06/2024 12:30   LONG TERM MONITOR (3-14 DAYS) Result Date: 03/05/2024   The patient was monitored for 7 days, 14 hours.   The predominant rhythm was sinus with an average rate of 85 bpm (range 58-120 bpm in sinus).   There were rare PACs and frequent PVCs (PVC burden ~30%).   917 episodes of nonsustained ventricular tachycardia were observed, lasting up to 10 beats with a maximum rate of 218 bpm.   102 supraventricular runs were observed, lasting up to 15  beats with a maximum rate of 160 bpm.   No sustained arrhythmia or prolonged pause was observed.   There were no patient  triggered events. Predominantly sinus rhythm with frequent PVCs and numerous episodes of nonsustained ventricular tachycardia and brief supraventricular runs, as outlined above.  Consider electrophysiology consultation.   CT CHEST ABDOMEN PELVIS WO CONTRAST Result Date: 03/03/2024 CLINICAL DATA:  Congestive heart failure. Fluid retention and urinary frequency. EXAM: CT CHEST, ABDOMEN AND PELVIS WITHOUT CONTRAST TECHNIQUE: Multidetector CT imaging of the chest, abdomen and pelvis was performed following the standard protocol without IV contrast. RADIATION DOSE REDUCTION: This exam was performed according to the departmental dose-optimization program which includes automated exposure control, adjustment of the mA and/or kV according to patient size and/or use of iterative reconstruction technique. COMPARISON:  CT of the chest 11/23/2023. CT abdomen and pelvis 01/23/2023. FINDINGS: CT CHEST FINDINGS Cardiovascular: Heart is mildly enlarged. Aorta is normal in size. There is no pericardial effusion. There are atherosclerotic calcifications of the aorta and coronary arteries. Patient is status post cardiac surgery. Mediastinum/Nodes: No enlarged mediastinal, hilar, or axillary lymph nodes. Thyroid  gland, trachea, and esophagus demonstrate no significant findings. Lungs/Pleura: There is a small hiatal hernia. Right-sided pleural drain has been removed in the interval. Small loculated right pleural effusion has decreased in size. Hyperdense posterior right pleural mass has decreased in size now measuring up to 2.8 cm in thickness (previously 4.3 cm). There is no new focal lung infiltrate, pleural effusion or pneumothorax. Musculoskeletal: No acute fractures are seen. Sternotomy are present. Bilateral gynecomastia. CT ABDOMEN PELVIS FINDINGS Hepatobiliary: Subcapsular fluid collection adjacent to the inferior right hepatic lobe also abuts the peritoneal surface. Is measures 6.9 x 7.5 x 4.4 cm and appears new from the  prior study. Second tiny subcapsular fluid collection is seen measuring up to 4 mm in thickness image 2/58. Gallbladder surgically absent. No biliary ductal dilatation identified. Pancreas: Unremarkable. No pancreatic ductal dilatation or surrounding inflammatory changes. Spleen: Normal in size without focal abnormality. Adrenals/Urinary Tract: There are 2 right renal calculi measuring up to 3 mm. Bilateral renal cysts are present measuring up to 4 cm. No hydronephrosis. Adrenal glands and bladder are within normal limits. Stomach/Bowel: Stomach is within normal limits. Appendix appears normal. No evidence of bowel wall thickening, distention, or inflammatory changes. There is sigmoid and descending colon diverticulosis. The appendix is not visualized. Vascular/Lymphatic: Aortic atherosclerosis. No enlarged abdominal or pelvic lymph nodes. Reproductive: Prostate gland is enlarged. Other: There is no ascites. There are small fat containing inguinal hernias. There is intramuscular wall thickening and overlying subcutaneous edema at the level of the right eleventh and twelfth rib adjacent to the right liver subcapsular fluid collection. This area measures approximately 5 x 13 by 9.7 cm. Musculoskeletal: No osseous erosion or acute fracture. IMPRESSION: 1. Interval removal of right-sided pleural drain with decrease in size of small loculated right pleural effusion. 2. Interval decrease in size of hyperdense posterior right pleural mass. 3. New subcapsular fluid collection adjacent to the inferior right hepatic lobe measuring up to 7.5 cm. Abscess is a possibility. Second tiny subcapsular fluid collection is seen. 4. Intramuscular wall thickening and overlying subcutaneous edema at the level of the right eleventh and twelfth rib adjacent to the right liver subcapsular fluid collection. Findings may be related to hematoma, infection, or neoplasm. 5. Nonobstructing right renal calculi. 6. Colonic diverticulosis. 7.  Prostatomegaly. 8. Aortic atherosclerosis. Aortic Atherosclerosis (ICD10-I70.0). Electronically Signed   By: Greig Pique M.D.   On: 03/03/2024  23:19    Microbiology: Results for orders placed or performed during the hospital encounter of 03/06/24  Blood Culture (routine x 2)     Status: Abnormal   Collection Time: 03/06/24 11:56 AM   Specimen: BLOOD LEFT ARM  Result Value Ref Range Status   Specimen Description   Final    BLOOD LEFT ARM Performed at [facility], [address], [place], [place] [address]    Special Requests   Final    BOTTLES DRAWN AEROBIC AND ANAEROBIC Blood Culture adequate volume Performed at [facility], [address] [place], [place] [address]    Culture  Setup Time   Final    GRAM POSITIVE COCCI ANAEROBIC BOTTLE ONLY CRITICAL RESULT CALLED TO, READ BACK BY AND VERIFIED WITH: ALEX CHAPPELL AT 0802 03/08/24.PMF Performed at [facility], [address], [place], [place] [address]    Culture (A)  Final    ENTEROCOCCUS FAECALIS VANCOMYCIN  RESISTANT ENTEROCOCCUS ISOLATED    Report Status 03/10/2024 FINAL  Final   Organism ID, Bacteria ENTEROCOCCUS FAECALIS  Final      Susceptibility   Enterococcus faecalis - MIC*    AMPICILLIN  <=2 SENSITIVE Sensitive     VANCOMYCIN  >=32 RESISTANT Resistant     GENTAMICIN SYNERGY SENSITIVE Sensitive     * ENTEROCOCCUS FAECALIS  Blood Culture ID Panel (Reflexed)     Status: Abnormal   Collection Time: 03/06/24 11:56 AM  Result Value Ref Range Status   Enterococcus faecalis DETECTED (A) NOT DETECTED Final    Comment: CRITICAL RESULT CALLED TO, READ BACK BY AND VERIFIED WITH: ALEX CHAPPELL AT 0802 03/08/24.PMF    Enterococcus Faecium NOT DETECTED NOT DETECTED Final   Listeria monocytogenes NOT DETECTED NOT DETECTED Final   Staphylococcus species NOT DETECTED NOT DETECTED Final   Staphylococcus aureus (BCID) NOT DETECTED NOT DETECTED Final   Staphylococcus epidermidis NOT DETECTED NOT DETECTED  Final   Staphylococcus lugdunensis NOT DETECTED NOT DETECTED Final   Streptococcus species NOT DETECTED NOT DETECTED Final   Streptococcus agalactiae NOT DETECTED NOT DETECTED Final   Streptococcus pneumoniae NOT DETECTED NOT DETECTED Final   Streptococcus pyogenes NOT DETECTED NOT DETECTED Final   A.calcoaceticus-baumannii NOT DETECTED NOT DETECTED Final   Bacteroides fragilis NOT DETECTED NOT DETECTED Final   Enterobacterales NOT DETECTED NOT DETECTED Final   Enterobacter cloacae complex NOT DETECTED NOT DETECTED Final   Escherichia coli NOT DETECTED NOT DETECTED Final   Klebsiella aerogenes NOT DETECTED NOT DETECTED Final   Klebsiella oxytoca NOT DETECTED NOT DETECTED Final   Klebsiella pneumoniae NOT DETECTED NOT DETECTED Final   Proteus species NOT DETECTED NOT DETECTED Final   Salmonella species NOT DETECTED NOT DETECTED Final   Serratia marcescens NOT DETECTED NOT DETECTED Final   Haemophilus influenzae NOT DETECTED NOT DETECTED Final   Neisseria meningitidis NOT DETECTED NOT DETECTED Final   Pseudomonas aeruginosa NOT DETECTED NOT DETECTED Final   Stenotrophomonas maltophilia NOT DETECTED NOT DETECTED Final   Candida albicans NOT DETECTED NOT DETECTED Final   Candida auris NOT DETECTED NOT DETECTED Final   Candida glabrata NOT DETECTED NOT DETECTED Final   Candida krusei NOT DETECTED NOT DETECTED Final   Candida parapsilosis NOT DETECTED NOT DETECTED Final   Candida tropicalis NOT DETECTED NOT DETECTED Final   Cryptococcus neoformans/gattii NOT DETECTED NOT DETECTED Final   Vancomycin  resistance DETECTED (A) NOT DETECTED Final    Comment: CRITICAL RESULT CALLED TO, READ BACK BY AND VERIFIED WITH: ALEX CHAPPELL AT 0802 03/08/24.PMF Performed  at [facility], [address] [place], [place] [address]   Blood Culture (routine x 2)     Status: None   Collection Time: 03/06/24  2:25 PM   Specimen: BLOOD  Result Value Ref Range Status   Specimen Description BLOOD  BLOOD LEFT HAND [facility]  Final   Special Requests   Final    BOTTLES DRAWN AEROBIC AND ANAEROBIC Blood Culture adequate volume   Culture   Final    NO GROWTH 5 DAYS Performed at [facility], [address] [place], [place] [address]    Report Status 03/11/2024 FINAL  Final  Aerobic/Anaerobic Culture w Gram Stain (surgical/deep wound)     Status: None   Collection Time: 03/07/24  4:00 PM   Specimen: Abscess  Result Value Ref Range Status   Specimen Description   Final    ABSCESS Performed at [facility], [address] [place], [place] [address]    Special Requests   Final    NONE Performed at [facility], [address] [place], [place] [address]    Gram Stain   Final    MODERATE WBC PRESENT, PREDOMINANTLY PMN MODERATE GRAM POSITIVE COCCI RARE GRAM NEGATIVE RODS Performed at [facility], [address], [place], [place] [address]    Culture   Final    ABUNDANT ENTEROCOCCUS FAECALIS ABUNDANT ESCHERICHIA COLI ABUNDANT PROTEUS MIRABILIS NO ANAEROBES ISOLATED VANCOMYCIN  RESISTANT ENTEROCOCCUS ISOLATED    Report Status 03/12/2024 FINAL  Final   Organism ID, Bacteria ESCHERICHIA COLI  Final   Organism ID, Bacteria PROTEUS MIRABILIS  Final   Organism ID, Bacteria ENTEROCOCCUS FAECALIS  Final      Susceptibility   Escherichia coli - MIC*    AMPICILLIN  <=2 SENSITIVE Sensitive     CEFEPIME  <=0.12 SENSITIVE Sensitive     CEFTAZIDIME  <=1 SENSITIVE Sensitive     CEFTRIAXONE  <=0.25 SENSITIVE Sensitive     CIPROFLOXACIN  <=0.25 SENSITIVE Sensitive     GENTAMICIN <=1 SENSITIVE Sensitive     IMIPENEM <=0.25 SENSITIVE Sensitive     TRIMETH/SULFA <=20 SENSITIVE Sensitive     AMPICILLIN /SULBACTAM <=2 SENSITIVE Sensitive     PIP/TAZO <=4 SENSITIVE Sensitive ug/mL    * ABUNDANT ESCHERICHIA COLI   Enterococcus faecalis - MIC*    AMPICILLIN  <=2 SENSITIVE Sensitive     VANCOMYCIN  >=32 RESISTANT Resistant     GENTAMICIN SYNERGY SENSITIVE Sensitive      * ABUNDANT ENTEROCOCCUS FAECALIS   Proteus mirabilis - MIC*    AMPICILLIN  <=2 SENSITIVE Sensitive     CEFEPIME  <=0.12 SENSITIVE Sensitive     CEFTAZIDIME  <=1 SENSITIVE Sensitive     CEFTRIAXONE  <=0.25 SENSITIVE Sensitive     CIPROFLOXACIN  <=0.25 SENSITIVE Sensitive     GENTAMICIN <=1 SENSITIVE Sensitive     IMIPENEM <=0.25 SENSITIVE Sensitive     TRIMETH/SULFA <=20 SENSITIVE Sensitive     AMPICILLIN /SULBACTAM <=2 SENSITIVE Sensitive     PIP/TAZO <=4 SENSITIVE Sensitive ug/mL    * ABUNDANT PROTEUS MIRABILIS  Culture, blood (single) w Reflex to ID Panel     Status: None   Collection Time: 03/10/24  5:29 PM   Specimen: BLOOD  Result Value Ref Range Status   Specimen Description BLOOD BLOOD LEFT ARM BLA  Final   Special Requests   Final    BOTTLES DRAWN AEROBIC AND ANAEROBIC Blood Culture adequate volume   Culture   Final    NO GROWTH 5 DAYS Performed at [facility], [address]  [place], [place] [address]    Report Status 03/15/2024 FINAL  Final    Labs: CBC: Recent Labs  Lab 03/11/24 0302  WBC 9.9  HGB 9.0*  HCT 30.7*  MCV 83.4  PLT 323   Basic Metabolic Panel: Recent Labs  Lab 03/11/24 0302 03/13/24 0436 03/15/24 0441  NA 139  --  140  K 4.8  --  4.2  CL 109  --  104  CO2 22  --  26  GLUCOSE 111*  --  98  BUN 29*  --  21  CREATININE 1.00  --  0.66  CALCIUM  8.4*  --  8.5*  MG  --  1.8  --   PHOS  --  2.9  --    Liver Function Tests: No results for input(s): AST, ALT, ALKPHOS, BILITOT, PROT, ALBUMIN  in the last 168 hours. CBG: Recent Labs  Lab 03/12/24 2054 03/13/24 0810 03/13/24 1207 03/13/24 1615  GLUCAP 155* 94 153* 146*    Discharge time spent: greater than 30 minutes.  Signed: AIDA CHO, MD [facility] 03/17/2024

## 2024-03-17 NOTE — TOC Transition Note (Signed)
 Transition of Care [facility]) - Discharge Note   Patient Details  Name: [name] Young. MRN: [id number] Date of Birth: Apr 02, 1941  Transition of [facility] CM/SW Contact:  Lauraine JAYSON Carpen, LCSW Phone Number: 03/17/2024, 11:40 AM   Clinical Narrative: Patient has orders to discharge to [facility] today. RN has already called report. [facility] has been arranged and he is next on the list. No further concerns. CSW signing off.    Final next level of care: Skilled Nursing Facility Barriers to Discharge: Barriers Resolved   Patient Goals and CMS Choice            Discharge Placement   Existing PASRR number confirmed : 03/10/24          Patient chooses bed at: [facility] Patient to be transferred to facility by: [facility] Name of family member notified: Ayven Pheasant Patient and family notified of of transfer: 03/17/24  Discharge Plan and Services Additional resources added to the After Visit Summary for       Post Acute Care Choice: Durable Medical Equipment                               Social Drivers of Health (SDOH) Interventions SDOH Screenings   Food Insecurity: Unknown (03/06/2024)  Housing: Low Risk  (03/06/2024)  Transportation Needs: No Transportation Needs (03/06/2024)  Utilities: Not At Risk (03/06/2024)  Depression (PHQ2-9): Low Risk  (12/31/2023)  Financial Resource Strain: Low Risk  (12/15/2023)   Received from [facility]  Physical Activity: Unknown (10/08/2023)  Social Connections: Moderately Isolated (03/06/2024)  Stress: Patient Declined (10/08/2023)  Tobacco Use: Medium Risk (03/09/2024)     Readmission Risk Interventions    03/07/2024    3:03 PM  Readmission Risk Prevention Plan  Transportation Screening Complete  PCP or Specialist Appt within 3-5 Days Complete  Social Work Consult for Recovery Care Planning/Counseling Complete  Palliative Care Screening Not  Applicable  Medication Review Oceanographer) Complete

## 2024-03-18 ENCOUNTER — Other Ambulatory Visit: Payer: Self-pay | Admitting: Infectious Diseases

## 2024-03-18 DIAGNOSIS — E440 Moderate protein-calorie malnutrition: Secondary | ICD-10-CM | POA: Diagnosis not present

## 2024-03-18 DIAGNOSIS — L02211 Cutaneous abscess of abdominal wall: Secondary | ICD-10-CM | POA: Diagnosis not present

## 2024-03-18 DIAGNOSIS — I5022 Chronic systolic (congestive) heart failure: Secondary | ICD-10-CM | POA: Diagnosis not present

## 2024-03-18 DIAGNOSIS — F32A Depression, unspecified: Secondary | ICD-10-CM | POA: Diagnosis not present

## 2024-03-18 DIAGNOSIS — Z5189 Encounter for other specified aftercare: Secondary | ICD-10-CM | POA: Diagnosis not present

## 2024-03-18 DIAGNOSIS — E875 Hyperkalemia: Secondary | ICD-10-CM | POA: Diagnosis not present

## 2024-03-18 DIAGNOSIS — K651 Peritoneal abscess: Secondary | ICD-10-CM

## 2024-03-18 DIAGNOSIS — D62 Acute posthemorrhagic anemia: Secondary | ICD-10-CM | POA: Diagnosis not present

## 2024-03-18 DIAGNOSIS — K750 Abscess of liver: Secondary | ICD-10-CM | POA: Diagnosis not present

## 2024-03-18 DIAGNOSIS — R2689 Other abnormalities of gait and mobility: Secondary | ICD-10-CM | POA: Diagnosis not present

## 2024-03-18 DIAGNOSIS — I509 Heart failure, unspecified: Secondary | ICD-10-CM | POA: Diagnosis not present

## 2024-03-18 DIAGNOSIS — Z741 Need for assistance with personal care: Secondary | ICD-10-CM | POA: Diagnosis not present

## 2024-03-18 DIAGNOSIS — I480 Paroxysmal atrial fibrillation: Secondary | ICD-10-CM | POA: Diagnosis not present

## 2024-03-18 DIAGNOSIS — I4891 Unspecified atrial fibrillation: Secondary | ICD-10-CM | POA: Diagnosis not present

## 2024-03-18 DIAGNOSIS — M6281 Muscle weakness (generalized): Secondary | ICD-10-CM | POA: Diagnosis not present

## 2024-03-18 DIAGNOSIS — R7881 Bacteremia: Secondary | ICD-10-CM | POA: Diagnosis not present

## 2024-03-18 DIAGNOSIS — N179 Acute kidney failure, unspecified: Secondary | ICD-10-CM | POA: Diagnosis not present

## 2024-03-20 ENCOUNTER — Ambulatory Visit: Admitting: Podiatry

## 2024-03-21 ENCOUNTER — Inpatient Hospital Stay
Admission: EM | Admit: 2024-03-21 | Discharge: 2024-03-24 | DRG: 377 | Disposition: A | Discharge status: Skilled Nursing Facility | Source: Skilled Nursing Facility | Attending: Internal Medicine | Admitting: Internal Medicine

## 2024-03-21 ENCOUNTER — Other Ambulatory Visit: Payer: Self-pay

## 2024-03-21 ENCOUNTER — Ambulatory Visit: Admitting: Family Medicine

## 2024-03-21 DIAGNOSIS — K625 Hemorrhage of anus and rectum: Principal | ICD-10-CM

## 2024-03-21 DIAGNOSIS — I9589 Other hypotension: Secondary | ICD-10-CM | POA: Diagnosis present

## 2024-03-21 DIAGNOSIS — K922 Gastrointestinal hemorrhage, unspecified: Secondary | ICD-10-CM

## 2024-03-21 DIAGNOSIS — E66811 Obesity, class 1: Secondary | ICD-10-CM | POA: Diagnosis present

## 2024-03-21 DIAGNOSIS — F40240 Claustrophobia: Secondary | ICD-10-CM | POA: Diagnosis present

## 2024-03-21 DIAGNOSIS — Z833 Family history of diabetes mellitus: Secondary | ICD-10-CM

## 2024-03-21 DIAGNOSIS — E1169 Type 2 diabetes mellitus with other specified complication: Secondary | ICD-10-CM | POA: Diagnosis not present

## 2024-03-21 DIAGNOSIS — I2510 Atherosclerotic heart disease of native coronary artery without angina pectoris: Secondary | ICD-10-CM | POA: Diagnosis present

## 2024-03-21 DIAGNOSIS — Z7901 Long term (current) use of anticoagulants: Secondary | ICD-10-CM

## 2024-03-21 DIAGNOSIS — N400 Enlarged prostate without lower urinary tract symptoms: Secondary | ICD-10-CM | POA: Diagnosis present

## 2024-03-21 DIAGNOSIS — E663 Overweight: Secondary | ICD-10-CM | POA: Diagnosis present

## 2024-03-21 DIAGNOSIS — E440 Moderate protein-calorie malnutrition: Secondary | ICD-10-CM | POA: Diagnosis not present

## 2024-03-21 DIAGNOSIS — I5022 Chronic systolic (congestive) heart failure: Secondary | ICD-10-CM | POA: Diagnosis not present

## 2024-03-21 DIAGNOSIS — E1122 Type 2 diabetes mellitus with diabetic chronic kidney disease: Secondary | ICD-10-CM | POA: Diagnosis not present

## 2024-03-21 DIAGNOSIS — I5032 Chronic diastolic (congestive) heart failure: Secondary | ICD-10-CM | POA: Diagnosis present

## 2024-03-21 DIAGNOSIS — K5731 Diverticulosis of large intestine without perforation or abscess with bleeding: Principal | ICD-10-CM | POA: Diagnosis present

## 2024-03-21 DIAGNOSIS — Z87891 Personal history of nicotine dependence: Secondary | ICD-10-CM | POA: Diagnosis not present

## 2024-03-21 DIAGNOSIS — I480 Paroxysmal atrial fibrillation: Secondary | ICD-10-CM | POA: Diagnosis present

## 2024-03-21 DIAGNOSIS — Z6830 Body mass index (BMI) 30.0-30.9, adult: Secondary | ICD-10-CM | POA: Diagnosis present

## 2024-03-21 DIAGNOSIS — I255 Ischemic cardiomyopathy: Secondary | ICD-10-CM | POA: Diagnosis present

## 2024-03-21 DIAGNOSIS — D519 Vitamin B12 deficiency anemia, unspecified: Secondary | ICD-10-CM | POA: Diagnosis present

## 2024-03-21 DIAGNOSIS — I130 Hypertensive heart and chronic kidney disease with heart failure and stage 1 through stage 4 chronic kidney disease, or unspecified chronic kidney disease: Secondary | ICD-10-CM | POA: Diagnosis not present

## 2024-03-21 DIAGNOSIS — I4891 Unspecified atrial fibrillation: Secondary | ICD-10-CM | POA: Diagnosis not present

## 2024-03-21 DIAGNOSIS — Z66 Do not resuscitate: Secondary | ICD-10-CM | POA: Diagnosis not present

## 2024-03-21 DIAGNOSIS — R41841 Cognitive communication deficit: Secondary | ICD-10-CM | POA: Diagnosis not present

## 2024-03-21 DIAGNOSIS — Z8249 Family history of ischemic heart disease and other diseases of the circulatory system: Secondary | ICD-10-CM

## 2024-03-21 DIAGNOSIS — Z803 Family history of malignant neoplasm of breast: Secondary | ICD-10-CM

## 2024-03-21 DIAGNOSIS — R7881 Bacteremia: Secondary | ICD-10-CM | POA: Diagnosis not present

## 2024-03-21 DIAGNOSIS — Z951 Presence of aortocoronary bypass graft: Secondary | ICD-10-CM

## 2024-03-21 DIAGNOSIS — I700 Atherosclerosis of aorta: Secondary | ICD-10-CM | POA: Diagnosis not present

## 2024-03-21 DIAGNOSIS — Z823 Family history of stroke: Secondary | ICD-10-CM | POA: Diagnosis not present

## 2024-03-21 DIAGNOSIS — R578 Other shock: Secondary | ICD-10-CM | POA: Diagnosis not present

## 2024-03-21 DIAGNOSIS — N200 Calculus of kidney: Secondary | ICD-10-CM | POA: Diagnosis not present

## 2024-03-21 DIAGNOSIS — K650 Generalized (acute) peritonitis: Secondary | ICD-10-CM | POA: Diagnosis not present

## 2024-03-21 DIAGNOSIS — N1831 Chronic kidney disease, stage 3a: Secondary | ICD-10-CM | POA: Diagnosis present

## 2024-03-21 DIAGNOSIS — T45515A Adverse effect of anticoagulants, initial encounter: Secondary | ICD-10-CM | POA: Diagnosis present

## 2024-03-21 DIAGNOSIS — I959 Hypotension, unspecified: Secondary | ICD-10-CM | POA: Diagnosis not present

## 2024-03-21 DIAGNOSIS — D62 Acute posthemorrhagic anemia: Secondary | ICD-10-CM | POA: Diagnosis not present

## 2024-03-21 DIAGNOSIS — E785 Hyperlipidemia, unspecified: Secondary | ICD-10-CM | POA: Diagnosis present

## 2024-03-21 DIAGNOSIS — Z7401 Bed confinement status: Secondary | ICD-10-CM | POA: Diagnosis not present

## 2024-03-21 DIAGNOSIS — Z800 Family history of malignant neoplasm of digestive organs: Secondary | ICD-10-CM

## 2024-03-21 DIAGNOSIS — K219 Gastro-esophageal reflux disease without esophagitis: Secondary | ICD-10-CM | POA: Diagnosis present

## 2024-03-21 DIAGNOSIS — Z820 Family history of epilepsy and other diseases of the nervous system: Secondary | ICD-10-CM

## 2024-03-21 DIAGNOSIS — R2689 Other abnormalities of gait and mobility: Secondary | ICD-10-CM | POA: Diagnosis not present

## 2024-03-21 DIAGNOSIS — M6281 Muscle weakness (generalized): Secondary | ICD-10-CM | POA: Diagnosis not present

## 2024-03-21 DIAGNOSIS — Z79899 Other long term (current) drug therapy: Secondary | ICD-10-CM

## 2024-03-21 DIAGNOSIS — R000 Tachycardia, unspecified: Secondary | ICD-10-CM | POA: Diagnosis not present

## 2024-03-21 DIAGNOSIS — K750 Abscess of liver: Secondary | ICD-10-CM | POA: Diagnosis not present

## 2024-03-21 DIAGNOSIS — Z8679 Personal history of other diseases of the circulatory system: Secondary | ICD-10-CM

## 2024-03-21 DIAGNOSIS — I10 Essential (primary) hypertension: Secondary | ICD-10-CM | POA: Diagnosis present

## 2024-03-21 DIAGNOSIS — Z7982 Long term (current) use of aspirin: Secondary | ICD-10-CM | POA: Diagnosis not present

## 2024-03-21 DIAGNOSIS — R001 Bradycardia, unspecified: Secondary | ICD-10-CM | POA: Diagnosis not present

## 2024-03-21 DIAGNOSIS — I499 Cardiac arrhythmia, unspecified: Secondary | ICD-10-CM | POA: Diagnosis not present

## 2024-03-21 LAB — BASIC METABOLIC PANEL WITH GFR
Anion gap: 12 (ref 5–15)
BUN: 39 mg/dL — ABNORMAL HIGH (ref 8–23)
CO2: 28 mmol/L (ref 22–32)
Calcium: 8.4 mg/dL — ABNORMAL LOW (ref 8.9–10.3)
Chloride: 100 mmol/L (ref 98–111)
Creatinine, Ser: 1.23 mg/dL (ref 0.61–1.24)
GFR, Estimated: 59 mL/min — ABNORMAL LOW (ref >60)
Glucose, Bld: 124 mg/dL — ABNORMAL HIGH (ref 70–99)
Potassium: 4.6 mmol/L (ref 3.5–5.1)
Sodium: 140 mmol/L (ref 135–145)

## 2024-03-21 LAB — HEMOGLOBIN AND HEMATOCRIT, BLOOD
HCT: 28.4 % — ABNORMAL LOW (ref 39.0–52.0)
Hemoglobin: 8.3 g/dL — ABNORMAL LOW (ref 13.0–17.0)

## 2024-03-21 LAB — CBC
HCT: 29.5 % — ABNORMAL LOW (ref 39.0–52.0)
Hemoglobin: 8.9 g/dL — ABNORMAL LOW (ref 13.0–17.0)
MCH: 26.1 pg (ref 26.0–34.0)
MCHC: 30.2 g/dL (ref 30.0–36.0)
MCV: 86.5 fL (ref 80.0–100.0)
Platelets: 296 K/uL (ref 150–400)
RBC: 3.41 MIL/uL — ABNORMAL LOW (ref 4.22–5.81)
RDW: 19.8 % — ABNORMAL HIGH (ref 11.5–15.5)
WBC: 7.4 K/uL (ref 4.0–10.5)
nRBC: 0 % (ref 0.0–0.2)

## 2024-03-21 MED ORDER — POLYETHYLENE GLYCOL 3350 17 G PO PACK: 17.0000 g | PACK | Freq: Every day | ORAL | Status: DC | PRN

## 2024-03-21 MED ORDER — ROSUVASTATIN CALCIUM 10 MG PO TABS
10.0000 mg | ORAL_TABLET | Freq: Every day | ORAL | Status: DC
  Administered 2024-03-21 – 2024-03-23 (×3): 10 mg via ORAL
  Filled 2024-03-21 (×3): qty 1

## 2024-03-21 MED ORDER — ASPIRIN 81 MG PO TBEC
81.0000 mg | DELAYED_RELEASE_TABLET | Freq: Every day | ORAL | Status: DC
  Administered 2024-03-22 – 2024-03-24 (×3): 81 mg via ORAL
  Filled 2024-03-21 (×4): qty 1

## 2024-03-21 MED ORDER — PANTOPRAZOLE SODIUM 40 MG IV SOLR
40.0000 mg | Freq: Once | INTRAVENOUS | Status: AC
  Administered 2024-03-21: 40 mg via INTRAVENOUS
  Filled 2024-03-21: qty 10

## 2024-03-21 MED ORDER — ONDANSETRON HCL 4 MG/2ML IJ SOLN: 4.0000 mg | Freq: Four times a day (QID) | INTRAMUSCULAR | Status: DC | PRN

## 2024-03-21 MED ORDER — AMOXICILLIN-POT CLAVULANATE 875-125 MG PO TABS
1.0000 | ORAL_TABLET | Freq: Two times a day (BID) | ORAL | Status: DC
  Administered 2024-03-21 – 2024-03-24 (×6): 1 via ORAL
  Filled 2024-03-21 (×6): qty 1

## 2024-03-21 MED ORDER — ONDANSETRON HCL 4 MG PO TABS: 4.0000 mg | ORAL_TABLET | Freq: Four times a day (QID) | ORAL | Status: DC | PRN

## 2024-03-21 MED ORDER — BISACODYL 5 MG PO TBEC
10.0000 mg | DELAYED_RELEASE_TABLET | Freq: Every day | ORAL | Status: DC
  Administered 2024-03-22: 10 mg via ORAL
  Filled 2024-03-21 (×2): qty 2

## 2024-03-21 MED ORDER — TAMSULOSIN HCL 0.4 MG PO CAPS
0.4000 mg | ORAL_CAPSULE | Freq: Every day | ORAL | Status: DC
  Administered 2024-03-21 – 2024-03-24 (×3): 0.4 mg via ORAL
  Filled 2024-03-21 (×4): qty 1

## 2024-03-21 MED ORDER — ACETAMINOPHEN 325 MG PO TABS
650.0000 mg | ORAL_TABLET | Freq: Four times a day (QID) | ORAL | Status: DC | PRN
  Administered 2024-03-23: 650 mg via ORAL
  Filled 2024-03-21: qty 2

## 2024-03-21 MED ORDER — MELATONIN 5 MG PO TABS
5.0000 mg | ORAL_TABLET | Freq: Every day | ORAL | Status: DC
  Administered 2024-03-21 – 2024-03-23 (×3): 5 mg via ORAL
  Filled 2024-03-21 (×3): qty 1

## 2024-03-21 MED ORDER — MIDODRINE HCL 5 MG PO TABS
10.0000 mg | ORAL_TABLET | Freq: Three times a day (TID) | ORAL | Status: DC
  Administered 2024-03-21 – 2024-03-24 (×10): 10 mg via ORAL
  Filled 2024-03-21 (×9): qty 2

## 2024-03-21 MED ORDER — GERHARDT'S BUTT CREAM
TOPICAL_CREAM | Freq: Two times a day (BID) | CUTANEOUS | Status: DC
  Filled 2024-03-21 (×2): qty 60

## 2024-03-21 MED ORDER — ACETAMINOPHEN 650 MG RE SUPP: 650.0000 mg | Freq: Four times a day (QID) | RECTAL | Status: DC | PRN

## 2024-03-21 MED ORDER — MIRTAZAPINE 15 MG PO TABS
30.0000 mg | ORAL_TABLET | Freq: Every day | ORAL | Status: DC
  Administered 2024-03-21 – 2024-03-23 (×3): 30 mg via ORAL
  Filled 2024-03-21 (×3): qty 2

## 2024-03-21 NOTE — H&P (Signed)
 History and Physical    [name] Young. FMW:[id number] DOB: 26-Nov-1940 DOA: 03/21/2024  PCP: Rilla Baller, MD (Confirm with patient/family/NH records and if not entered, this has to be entered at [facility] point of entry) Patient coming from: Home  I have personally briefly reviewed patient's old medical records in [facility]  Chief Complaint: Rectal bleeding  HPI: [name] Young. is a 83 y.o. male with medical history significant of recently diagnosed perihepatic abscess status post IR drainage, PAF on Eliquis , HTN, chronic HFpEF, CAD status post CABG x 3, CKD stage IIIa, IIDM, history of endocarditis with Streptococcus bacteremia, history of E. coli bacteremia, sent from SNF for evaluation of rectal bleeding.  Patient was recently hospitalized for perihepatic abscess formation underwent IR guided drainage and was discharged to SNF with p.o. antibiotics.  Patient has been doing reasonly well, but she started to develop severe constipation for the last 3 to 4 days.  He took some MiraLAX  last night and this morning, he passed hard stool.  After that, patient had another  bloody jelly like bowel movement large amount denied any abdominal pain, denies any chest pain shortness of breath dizziness palpitations.  Half an hour before rectal bleeding patient took this morning dose of Eliquis .  ED Course: Afebrile, no tachycardia blood pressure borderline low 109/59 O2 saturation 100% on room air.  Blood work showed hemoglobin 8.9 compared to baseline 8.8-9.0 earlier this week.  BUN 39 creatinine 1.2 glucose 124 WBC 7.4.  Review of Systems: As per HPI otherwise 14 point review of systems negative.    Past Medical History:  Diagnosis Date   Adenomatous colon polyp    Aortic atherosclerosis (HCC)    Aortic stenosis    a. 11/2023 Echo: mild-mod AS, mean grad .   Arthritis    Ascending aorta dilatation (HCC)    a.) TTE 11/23/2017: asc Ao measured 37 mm; b.) TTE 10/23/2021: Ao  root measured 41 mm, asc Ao measured 38 mm; c.) TEE 10/28/2021: asc Ao measured 38 mm; d.) TTE 01/11/2022: Ao root 40 mm, asc Ao 39 mm   Ascending cholangitis 10/2021   BPH (benign prostatic hyperplasia)    CKD (chronic kidney disease), stage III (HCC)    Claustrophobia    Coronary artery disease    a.) LHC 12/21/2017: 75% mLAD, 90% D1, 80-99% OM1/2, CTO pRCA (L-R collaterals) --> CVTS consult. b.) 01/2018 CABG x 3 (LIMA->LAD, VG->D1, VG->RPL); c. 01/2024 Cath: LM nl, LAD 20p, 67m, D1 70ost, 90, LCX 30ost, OM1 80, OM2 99- fills from collats from D1. RCA 100p CTO, RPAV 100 (fills via collats from 3rd septal), VG->RPDA nl, LIMA->LAD nl, VG->D1 nl. RHC PA 35/10 (8), PCWP 10, CO 6.5-->Med Rx.   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   In setting of bacteremia from ascending colangitis   GERD (gastroesophageal reflux disease)    Hemothorax on right 11/2023   HFrEF (heart failure with reduced ejection fraction) (HCC)    a.) TTE 11/23/2017: EF 50-55%, mod MAC, triv TR, G1DD; b.) TTE 10/23/2021: EF 40-45%, mild LAE, Ao sclerosis, triv MR, G2DD; c.) TEE 10/28/2021: EF 40-45%, glob HK, mobile vegitation on MV; d.) TTE 01/11/2022: EF 50-55%, mild LVH, RVE, Ao sclerosis, mild MR/AR, G1DD   History of cholelithiasis    History of kidney stones    ca ox [place] [facility]) now [place]   History of pneumonia    HLD (hyperlipidemia)    HTN (hypertension)  Ischemic cardiomyopathy    a. 12/2022 Echo: EF 50-55%; b. 11/2023 Echo: EF 40-45%, glob HK, mild LVH, GrI DD, nl RV fxn, mild MR, mild AI, mild-mod AS, Ao root 43mm.   Jaundice    age 68   Long term current use of anticoagulant    a.) apixaban    Paroxysmal atrial fibrillation (HCC)    a.) CHA2DS2VASc = 6 (age x 2, HFrEF, HTN, vascular disease history, T2DM);  b.) rate/rhythm maintained on oral metoprolol  succinate; chronically anticoagulated with apixaban    Pleural effusion    a. 11/2023 CT chest: Large R ple effusion,  partially loculated-->s/p pleurx.   Pneumonia    Right-sided carotid artery disease (HCC)    a.) carotid doppler 04/05/2022: 1-39% RICA   S/P CABG x 3    a.) LIMA-LAD, SVG-diagonal, SVG-PL branch of RCA   S/P cataract extraction and insertion of intraocular lens    T2DM (type 2 diabetes mellitus) (HCC) 2010    Past Surgical History:  Procedure Laterality Date   CARPAL TUNNEL RELEASE Bilateral    CATARACT EXTRACTION, BILATERAL     COLONOSCOPY  11/2012   12 adenomatous polyps, diverticulosis, rec rpt 1 yr [facility])   COLONOSCOPY  12/2013   3 polyps, diverticulosis, rec rpt 3 yrs [facility])   COLONOSCOPY  06/2019   6 polyps (TA), diverticulosis, f/u left open ended [facility])   CORONARY ARTERY BYPASS GRAFT N/A 01/18/2018   Procedure: CORONARY ARTERY BYPASS GRAFTING (CABG) x 3; Using Left Internal Mammary Artery, and Right Greater Saphenous Vein harvested Endoscopically, Coronary Artery Endarterectomy;  Surgeon: [name] Hanford Coy, MD;  Location: [facility] OR;  Service: Open Heart Surgery;  Laterality: N/A;   ERCP N/A 10/23/2021   Procedure: ENDOSCOPIC RETROGRADE CHOLANGIOPANCREATOGRAPHY (ERCP);  Surgeon: Aneita Gwendlyn DASEN, MD;  Location: [facility] ENDOSCOPY;  Service: Endoscopy;  Laterality: N/A;   IR CATHETER TUBE CHANGE  01/08/2023   IR PATIENT EVAL TECH 0-60 MINS  03/07/2024   IR PERC PLEURAL DRAIN W/INDWELL CATH W/IMG GUIDE  11/20/2023   IR RADIOLOGIST EVAL & MGMT  01/23/2023   IR THORACENTESIS ASP PLEURAL SPACE W/IMG GUIDE  12/29/2022   KNEE CARTILAGE SURGERY Left    LEFT HEART CATH AND CORONARY ANGIOGRAPHY N/A 12/21/2017   Procedure: LEFT HEART CATH AND CORONARY ANGIOGRAPHY;  Surgeon: Mady Bruckner, MD;  Location: [facility] INVASIVE CV LAB;  Service: Cardiovascular;  Laterality: N/A;   LITHOTRIPSY     REMOVAL OF STONES  10/23/2021   Procedure: REMOVAL OF STONES;  Surgeon: Aneita Gwendlyn DASEN, MD;  Location: [facility] ENDOSCOPY;  Service: Endoscopy;;   RIGHT/LEFT HEART CATH AND CORONARY/GRAFT ANGIOGRAPHY Bilateral 01/25/2024    Procedure: RIGHT/LEFT HEART CATH AND CORONARY/GRAFT ANGIOGRAPHY;  Surgeon: Mady Bruckner, MD;  Location: [facility] INVASIVE CV LAB;  Service: Cardiovascular;  Laterality: Bilateral;   SPHINCTEROTOMY  10/23/2021   Procedure: SPHINCTEROTOMY;  Surgeon: Aneita Gwendlyn DASEN, MD;  Location: [facility] ENDOSCOPY;  Service: Endoscopy;;   TEE WITHOUT CARDIOVERSION N/A 01/18/2018   Procedure: TRANSESOPHAGEAL ECHOCARDIOGRAM (TEE);  Surgeon: [name] Hanford, Coy, MD;  Location: [facility] OR;  Service: Open Heart Surgery;  Laterality: N/A;   TEE WITHOUT CARDIOVERSION N/A 10/28/2021   Procedure: TRANSESOPHAGEAL ECHOCARDIOGRAM (TEE);  Surgeon: Mona Vinie BROCKS, MD;  Location: [facility] ENDOSCOPY;  Service: Cardiovascular;  Laterality: N/A;   TEE WITHOUT CARDIOVERSION N/A 03/12/2024   Procedure: ECHOCARDIOGRAM, TRANSESOPHAGEAL;  Surgeon: Darliss Rogue, MD;  Location: [facility] ORS;  Service: Cardiovascular;  Laterality: N/A;   UMBILICAL HERNIA REPAIR     with mesh     reports that  he has never smoked. He has been exposed to tobacco smoke. He quit smokeless tobacco use about 22 years ago.  His smokeless tobacco use included chew. He reports that he does not currently use alcohol. He reports that he does not use drugs.  No Known Allergies  Family History  Problem Relation Age of Onset   Alzheimer's disease Mother    Breast cancer Mother        breast   Atrial fibrillation Mother    CAD Mother    Stroke Father 7   Diabetes Father    CAD Father    Colon polyps Sister    Colon polyps Brother    Rectal cancer Maternal Grandfather        rectal   Colon cancer Maternal Grandfather 71   Esophageal cancer Neg Hx    Stomach cancer Neg Hx      Prior to Admission medications   Medication Sig Start Date End Date Taking? Authorizing Provider  amoxicillin -clavulanate (AUGMENTIN ) 875-125 MG tablet Take 1 tablet by mouth 2 (two) times daily for 14 days. 03/17/24 03/31/24  Jens Durand, MD  aspirin  EC 81 MG tablet Take 81 mg by mouth daily.  Swallow whole.    [provider]  bisacodyl  (DULCOLAX) 5 MG EC tablet Take 10 mg by mouth at bedtime.    [provider]  Cholecalciferol  (VITAMIN D3) 25 MCG (1000 UT) CAPS Take 1 capsule (1,000 Units total) by mouth daily. 05/24/20   Rilla Baller, MD  Cyanocobalamin  (B-12) 1000 MCG CAPS Take 1 capsule by mouth every Monday, Wednesday, and Friday. 07/16/23   Rilla Baller, MD  ELIQUIS  5 MG TABS tablet TAKE 1 TABLET BY MOUTH TWICE A DAY Patient not taking: Reported on 02/08/2024 09/19/23   End, Lonni, MD  feeding supplement (ENSURE ENLIVE / ENSURE PLUS) LIQD Take 237 mLs by mouth 2 (two) times daily between meals. Patient taking differently: Take 237 mLs by mouth daily. 01/05/23   Josette Ade, MD  ferrous sulfate  325 (65 FE) MG EC tablet Take 1 tablet (325 mg total) by mouth daily. 01/09/24   End, Lonni, MD  glucose blood (TRUE METRIX BLOOD GLUCOSE TEST) test strip Use as instructed to check blood sugar once a day 03/06/23   Rilla Baller, MD  lisinopril  (ZESTRIL ) 2.5 MG tablet Take 2.5 mg by mouth daily. 12/19/23 12/18/24  [provider]  Magnesium  250 MG TABS Take 250 mg by mouth daily.    [provider]  metoprolol  tartrate (LOPRESSOR ) 25 MG tablet Take 0.5 tablets (12.5 mg total) by mouth 2 (two) times daily. 12/10/23   Rilla Baller, MD  midodrine  (PROAMATINE ) 10 MG tablet Take 1 tablet (10 mg total) by mouth 3 (three) times daily with meals. 03/17/24   Jens Durand, MD  mirtazapine  (REMERON ) 30 MG tablet Take 1 tablet (30 mg total) by mouth at bedtime. 12/31/23   Rilla Baller, MD  Multiple Vitamins-Minerals (PRESERVISION AREDS 2 PO) Take 1 capsule by mouth daily.    [provider]  polyethylene glycol powder (GLYCOLAX /MIRALAX ) 17 GM/SCOOP powder Take 8.5-17 g by mouth daily as needed for moderate constipation. 03/17/24   Jens Durand, MD  rosuvastatin  (CRESTOR ) 10 MG tablet TAKE 1 TABLET BY MOUTH EVERY DAY 12/19/23   Vivienne Lonni Ingle, NP  spironolactone  (ALDACTONE ) 25 MG tablet Take 25 mg by mouth daily. 12/19/23 03/18/24  [provider]  tamsulosin  (FLOMAX ) 0.4 MG CAPS capsule Take 1 capsule (0.4 mg total) by mouth daily. 07/16/23  Rilla Baller, MD  torsemide  (DEMADEX ) 20 MG tablet Take 1 tablet (20 mg total) by mouth every other day. 01/25/24 04/24/24  Mady Bruckner, MD    Physical Exam: Vitals:   03/21/24 1058 03/21/24 1102 03/21/24 1400  BP:  (!) 109/59 (!) 119/59  Pulse:  63 69  Resp:  18 16  Temp:  98 F (36.7 C)   TempSrc:  Oral   SpO2:  96% 100%  Weight: 95.5 kg    Height: 5' 10 (1.778 m)      Constitutional: NAD, calm, comfortable Vitals:   03/21/24 1058 03/21/24 1102 03/21/24 1400  BP:  (!) 109/59 (!) 119/59  Pulse:  63 69  Resp:  18 16  Temp:  98 F (36.7 C)   TempSrc:  Oral   SpO2:  96% 100%  Weight: 95.5 kg    Height: 5' 10 (1.778 m)     Eyes: PERRL, lids and conjunctivae normal ENMT: Mucous membranes are moist. Posterior pharynx clear of any exudate or lesions.Normal dentition.  Neck: normal, supple, no masses, no thyromegaly Respiratory: clear to auscultation bilaterally, no wheezing, no crackles. Normal respiratory effort. No accessory muscle use.  Cardiovascular: Regular rate and rhythm, no murmurs / rubs / gallops. No extremity edema. 2+ pedal pulses. No carotid bruits.  Abdomen: no tenderness, no masses palpated. No hepatosplenomegaly. Bowel sounds positive.  Musculoskeletal: no clubbing / cyanosis. No joint deformity upper and lower extremities. Good ROM, no contractures. Normal muscle tone.  Skin: no rashes, lesions, ulcers. No induration Neurologic: CN 2-12 grossly intact. Sensation intact, DTR normal. Strength 5/5 in all 4.  Psychiatric: Normal judgment and insight. Alert and oriented x 3. Normal mood.     Labs on Admission: I have personally reviewed following labs and imaging studies  CBC: Recent Labs  Lab 03/21/24 1104  WBC 7.4  HGB  8.9*  HCT 29.5*  MCV 86.5  PLT 296   Basic Metabolic Panel: Recent Labs  Lab 03/15/24 0441 03/21/24 1104  NA 140 140  K 4.2 4.6  CL 104 100  CO2 26 28  GLUCOSE 98 124*  BUN 21 39*  CREATININE 0.66 1.23  CALCIUM  8.5* 8.4*   GFR: Estimated Creatinine Clearance: 54.6 mL/min (by C-G formula based on SCr of 1.23 mg/dL). Liver Function Tests: No results for input(s): AST, ALT, ALKPHOS, BILITOT, PROT, ALBUMIN  in the last 168 hours. No results for input(s): LIPASE, AMYLASE in the last 168 hours. No results for input(s): AMMONIA in the last 168 hours. Coagulation Profile: No results for input(s): INR, PROTIME in the last 168 hours. Cardiac Enzymes: No results for input(s): CKTOTAL, CKMB, CKMBINDEX, TROPONINI in the last 168 hours. BNP (last 3 results) Recent Labs    04/25/23 1516  PROBNP 142.0*   HbA1C: No results for input(s): HGBA1C in the last 72 hours. CBG: No results for input(s): GLUCAP in the last 168 hours. Lipid Profile: No results for input(s): CHOL, HDL, LDLCALC, TRIG, CHOLHDL, LDLDIRECT in the last 72 hours. Thyroid  Function Tests: No results for input(s): TSH, T4TOTAL, FREET4, T3FREE, THYROIDAB in the last 72 hours. Anemia Panel: No results for input(s): VITAMINB12, FOLATE, FERRITIN, TIBC, IRON, RETICCTPCT in the last 72 hours. Urine analysis:    Component Value Date/Time   COLORURINE YELLOW (A) 03/06/2024 1413   APPEARANCEUR HAZY (A) 03/06/2024 1413   LABSPEC 1.013 03/06/2024 1413   PHURINE 5.0 03/06/2024 1413   GLUCOSEU NEGATIVE 03/06/2024 1413   HGBUR NEGATIVE 03/06/2024 1413   BILIRUBINUR NEGATIVE 03/06/2024 1413  BILIRUBINUR negative 07/16/2023 1137   KETONESUR NEGATIVE 03/06/2024 1413   PROTEINUR NEGATIVE 03/06/2024 1413   UROBILINOGEN 0.2 07/16/2023 1137   UROBILINOGEN 1.0 06/24/2013 2159   NITRITE NEGATIVE 03/06/2024 1413   LEUKOCYTESUR NEGATIVE 03/06/2024 1413     Radiological Exams on Admission: No results found.  EKG: Ordered  Assessment/Plan Principal Problem:   Lower GI bleed  (please populate well all problems here in Problem List. (For example, if patient is on BP meds at home and you resume or decide to hold them, it is a problem that needs to be her. Same for CAD, COPD, HLD and so on)  Hematochezia Acute on chronic normocytic anemia - Clinically suspect diverticulosis bleeding, exacerbated by systemic anticoagulation Eliquis  for A-fib - Given that patient took Eliquis  8-9 hours ago, and after that initial episode of rectal bleeding no more following bleeding episode, no indication for Eliquis  reversal. - Agreed with holding off CTA as patient's H&H remained stable and no tachycardia or significant hypotension. - Recheck H&H tonight and tomorrow morning transfuse for hemodynamic instability - Other DDx, low suspicion for upper GI bleed.  Recent Enterococcus faecalis bacteremia and perihepatic abscess formation - Status post drainage - Continue Augmentin , last dose will be tried 21st  History of orthostatic hypotension - Continue midodrine   Chronic HFpEF -Euvolemic - Hold off torsemide  and spironolactone  today    DVT prophylaxis: SCD Code Status: DNR Family Communication: Wife at bedside Disposition Plan: Patient sick with lower GI bleed requiring inpatient care close monitoring H&H, expect more than 2 midnight hospital stay Consults called: None Admission status: Telemetry admission   Cort ONEIDA Mana MD [facility] Pager [phone]  03/21/2024, 2:58 PM

## 2024-03-21 NOTE — ED Triage Notes (Addendum)
 Pt comes via [facility] from [facility] with c/o rectal bleeding. Pt states this started today. Pt denies any pain. Pt states last BM was dark and black. Pt is bedbound. Pt not on thinners. Pt has hx of afib.  BP-96  Pt has line and 250 fluids given.  Pt rate about 30-76 with some PVC. Pt denies any cp or sob. CBG 157  Lat BP 110/60

## 2024-03-21 NOTE — ED Provider Notes (Signed)
 [facility] Provider Note    Event Date/Time   First MD Initiated Contact with Patient 03/21/24 1257     (approximate)   History   Rectal Bleeding   HPI  [name] Young. is a 83 year old male with history of A-fib on Eliquis , HTN, CHF, DM presenting to the emergency department for evaluation of rectal bleeding.  Patient recently discharged from the hospital, was restarted on his Eliquis  at that time.  He is currently in a SNF.  There, he was noted to have a large bowel movement with bright red blood, loose stool.  Note, triage note says patient is not on blood thinners, but did confirm with patient and wife that he has been resumed on his Eliquis  at the time of his recent discharge.  Denies chest pain or shortness of breath.  Denies abdominal pain.  I reviewed his discharge summary from 03/17/2024.  At that time patient presented with a perihepatic abscess noted on outpatient imaging.  While in the hospital he was noted to have acute blood loss anemia for which she was transfused 3 unit PRBCs.  He was discharged to a SNF.       Physical Exam   Triage Vital Signs: ED Triage Vitals  Encounter Vitals Group     BP 03/21/24 1102 (!) 109/59     Girls Systolic BP Percentile --      Girls Diastolic BP Percentile --      Boys Systolic BP Percentile --      Boys Diastolic BP Percentile --      Pulse Rate 03/21/24 1102 63     Resp 03/21/24 1102 18     Temp 03/21/24 1102 98 F (36.7 C)     Temp Source 03/21/24 1102 Oral     SpO2 03/21/24 1102 96 %     Weight 03/21/24 1058 210 lb 8.6 oz (95.5 kg)     Height 03/21/24 1058 5' 10 (1.778 m)     Head Circumference --      Peak Flow --      Pain Score 03/21/24 1058 0     Pain Loc --      Pain Education --      Exclude from Growth Chart --     Most recent vital signs: Vitals:   03/21/24 1102  BP: (!) 109/59  Pulse: 63  Resp: 18  Temp: 98 F (36.7 C)  SpO2: 96%     General: Awake, interactive   CV:  Regular rate, good peripheral perfusion.  Resp:  Unlabored respirations.  Lungs good auscultation Abd:  Nondistended.  Soft nontender, brown stool on rectal exam, Hemoccult positive Neuro:  Symmetric facial movement, fluid speech   ED Results / Procedures / Treatments   Labs (all labs ordered are listed, but only abnormal results are displayed) Labs Reviewed  CBC - Abnormal; Notable for the following components:      Result Value   RBC 3.41 (*)    Hemoglobin 8.9 (*)    HCT 29.5 (*)    RDW 19.8 (*)    All other components within normal limits  BASIC METABOLIC PANEL WITH GFR - Abnormal; Notable for the following components:   Glucose, Bld 124 (*)    BUN 39 (*)    Calcium  8.4 (*)    GFR, Estimated 59 (*)    All other components within normal limits  TYPE AND SCREEN     EKG EKG independently reviewed and interpreted by  myself demonstrates:    RADIOLOGY Imaging independently reviewed and interpreted by myself demonstrates:   Formal Radiology Read:  No results found.  PROCEDURES:  Critical Care performed: No  Procedures   MEDICATIONS ORDERED IN ED: Medications  pantoprazole  (PROTONIX ) injection 40 mg (has no administration in time range)     IMPRESSION / MDM / ASSESSMENT AND PLAN / ED COURSE  I reviewed the triage vital signs and the nursing notes.  Differential diagnosis includes, but is not limited to, lower GI bleed including diverticular bleed, anorectal disease, consideration for upper GI bleed, but lower suspicion given report of bright red blood  Patient's presentation is most consistent with acute presentation with potential threat to life or bodily function.  83 year old male with complicated recent medical history presenting with rectal bleeding on anticoagulation.  Low normal blood pressure but stable vitals.  CMP with stable anemia with hemoglobin of 8.9.  BMP without significant derangement.  Type and screen sent.  With stable hemoglobin and  only 1 episode of rectal bleeding, do not think transfusion indicated at this time.  However, given patient's rectal bleeding on anticoagulation, do think he is appropriate for admission.  IV Protonix  ordered.  Will reach to the hospitalist team. Clinical Course as of 03/21/24 1403  Fri Mar 21, 2024  1401 Case discussed with Dr. Laurita.  He will evaluate for anticipated admission. [NR]    Clinical Course User Index [NR] Levander Slate, MD     FINAL CLINICAL IMPRESSION(S) / ED DIAGNOSES   Final diagnoses:  Rectal bleeding     Rx / DC Orders   ED Discharge Orders     None        Note:  This document was prepared using Dragon voice recognition software and may include unintentional dictation errors.   Levander Slate, MD 03/21/24 [phone]

## 2024-03-21 NOTE — Consult Note (Addendum)
 WOC Nurse Consult Note: Reason for Consult: Consult requested for buttocks.  Performed remotely after review of progress notes and photos in the EMR. Bilat buttocks and sacrum are red, moist and macerated with patchy areas of partial thickness skin loss.  Appearance is consistent with moisture associated skin damage and possible candidiasis.  Dressing procedure/placement/frequency: Topical treatment orders provided for bedside nurses to perform as follows to protect skin and repel moisture: Apply Gerhardts' cream to buttocks BID and PRN when turning or cleaning. Please re-consult if further assistance is needed.  Thank-you,  Stephane Fought MSN, RN, CWOCN, [place], CNS [phone]

## 2024-03-22 ENCOUNTER — Inpatient Hospital Stay

## 2024-03-22 DIAGNOSIS — D62 Acute posthemorrhagic anemia: Secondary | ICD-10-CM | POA: Diagnosis not present

## 2024-03-22 DIAGNOSIS — I5022 Chronic systolic (congestive) heart failure: Secondary | ICD-10-CM

## 2024-03-22 DIAGNOSIS — K922 Gastrointestinal hemorrhage, unspecified: Secondary | ICD-10-CM | POA: Diagnosis not present

## 2024-03-22 LAB — HEMOGLOBIN
Hemoglobin: 8.8 g/dL — ABNORMAL LOW (ref 13.0–17.0)
Hemoglobin: 9.9 g/dL — ABNORMAL LOW (ref 13.0–17.0)

## 2024-03-22 LAB — CBC
HCT: 27.2 % — ABNORMAL LOW (ref 39.0–52.0)
Hemoglobin: 8.3 g/dL — ABNORMAL LOW (ref 13.0–17.0)
MCH: 26.4 pg (ref 26.0–34.0)
MCHC: 30.5 g/dL (ref 30.0–36.0)
MCV: 86.6 fL (ref 80.0–100.0)
Platelets: 277 K/uL (ref 150–400)
RBC: 3.14 MIL/uL — ABNORMAL LOW (ref 4.22–5.81)
RDW: 20 % — ABNORMAL HIGH (ref 11.5–15.5)
WBC: 7.1 K/uL (ref 4.0–10.5)
nRBC: 0 % (ref 0.0–0.2)

## 2024-03-22 LAB — PREPARE RBC (CROSSMATCH)

## 2024-03-22 MED ORDER — IOHEXOL 350 MG/ML SOLN
100.0000 mL | Freq: Once | INTRAVENOUS | Status: AC | PRN
  Administered 2024-03-22: 75 mL via INTRAVENOUS

## 2024-03-22 MED ORDER — SODIUM CHLORIDE 0.9 % IV BOLUS
500.0000 mL | Freq: Once | INTRAVENOUS | Status: AC
  Administered 2024-03-22: 500 mL via INTRAVENOUS

## 2024-03-22 MED ORDER — SODIUM CHLORIDE 0.9 % IV SOLN: Freq: Once | INTRAVENOUS | Status: AC

## 2024-03-22 MED ORDER — VITAMIN B-12 1000 MCG PO TABS
1000.0000 ug | ORAL_TABLET | Freq: Every day | ORAL | Status: DC
  Administered 2024-03-22 – 2024-03-24 (×3): 1000 ug via ORAL
  Filled 2024-03-22 (×4): qty 1

## 2024-03-22 NOTE — TOC Initial Note (Signed)
 Transition of Care (TOC) - Initial/Assessment Note    Patient Details  Name: [name] Young. MRN: [id number] Date of Birth: 1941/04/15  Transition of Care [facility]) CM/SW Contact:    Edsel DELENA Bernardo, LCSW Phone Number: 03/22/2024, 3:05 PM  Clinical Narrative:                  SW met with pt at bedside.  Wife was present as well.  Pt gave sw verbal permission to speak with wife.  Pt was discharged on 03/17/24 from this hospital and admitted into [facility] and Rehab same day.  Pt returns to ED due to blood in urine.  Wife would like for pt to return to SNF.  Wife stated that facility expressed to her that if pt was not discharged today, 03/22/24, that she would have to private pay for the room to hold it.  Wife will be going to SNF today to retrieve pts items.  SW expressed to Wife that sw will need to restart process for placement once pt gets closer to discharge. Wife stated that daughter lives in [place], but wife is the main support to pt.  11 years married. No safety/ DV concerns.  Reports 2 falls in July 2025. PCP: Dr. Rilla and [facility] HH in [facility] past [facility]. Wife would like wheel chair for pt.  SW notified Dr. Laurita and Nurse Rosina Moats of wife request.  No SDOH issues reported or identified.  No financial issues/ concerns reported.  Wife stated that pt uses pull ups. No CPAP/ Oxygen in the home. Pt may need ambulance support for transportation once discharged if not appropriate to be transported by wife. Wife stated that she fixes pt pill box at home and that pt takes meds as prescribed. SW will continue to monitor and support pt while in ED with discharge planning needs  Expected Discharge Plan: Skilled Nursing Facility (for Rehab) Barriers to Discharge: Continued Medical Work up   Patient Goals and CMS Choice     Choice offered to / list presented to :  (Pt and wife would like for pt to return to [facility] and Rehab if possible, 2nd choice- [facility])       Expected Discharge Plan and Services In-house Referral: Clinical Social Work     Living arrangements for the past 2 months: Single Family Home                                      Prior Living Arrangements/Services Living arrangements for the past 2 months: Single Family Home Lives with:: Spouse Patient language and need for interpreter reviewed:: Yes Do you feel safe going back to the place where you live?: Yes (once rehab at SNF is completed and appropriate, or HH at home)      Need for Family Participation in Patient Care: Yes (Comment) Care giver support system in place?: Yes (comment) Current home services: DME (rolling walker, walker, cane) Criminal Activity/Legal Involvement Pertinent to Current Situation/Hospitalization: No - Comment as needed  Activities of Daily Living      Permission Sought/Granted                  Emotional Assessment Appearance:: Appears stated age Attitude/Demeanor/Rapport: Lethargic Affect (typically observed): Calm Orientation: : Oriented to Self, Oriented to Place   Psych Involvement: No (comment)  Admission diagnosis:  Lower GI bleed [K92.2] Patient Active Problem  List   Diagnosis Date Noted   Acute blood loss anemia 03/22/2024   Lower GI bleed 03/21/2024   Bacteremia 03/12/2024   Malnutrition of moderate degree 03/07/2024   Pressure injury of skin 03/07/2024   Perihepatic abscess (HCC) 03/06/2024   Essential hypertension 03/06/2024   Hyperkalemia 03/06/2024   AKI (acute kidney injury) (HCC) 03/06/2024   Fall at home, initial encounter 02/11/2024   Abdominal right lower quadrant swelling 02/11/2024   Hemothorax on right 11/20/2023   Bradycardia 04/25/2023   General weakness 04/25/2023   Chronic heart failure with mildly reduced ejection fraction (HFmrEF, 41-49%) (HCC) 03/06/2023   Frequent PVCs 03/06/2023   Insomnia 03/05/2023   Decubitus ulcer of right buttock, stage 1 01/04/2023   Aortic valve stenosis  01/02/2023   History of endocarditis 12/28/2022   Hepatic abscess 12/28/2022   Abnormal TSH 12/27/2022   Recurrent pleural effusion on right 12/21/2022   Pre-op evaluation 05/03/2022   CCC (chronic calculous cholecystitis) 04/25/2022   History of acute cholangitis 04/25/2022   Palliative care by specialist    Bacteremia due to Streptococcus 10/24/2021   Vitamin B12 deficiency 05/24/2020   Vitamin D  deficiency 05/24/2020   Early dry stage nonexudative age-related macular degeneration of both eyes 12/01/2019   Pain due to onychomycosis of toenails of both feet 07/28/2019   Diabetic neuropathy (HCC) 07/28/2019   Hearing loss, right 05/22/2019   PAF (paroxysmal atrial fibrillation) (HCC) 02/08/2018   FTT (failure to thrive) in adult 01/28/2018   Anemia    Constipation    S/P CABG x 3 01/18/2018   Coronary artery disease 12/29/2017   Chronic inactive rheumatic heart disease 11/24/2017   SOB (shortness of breath) 11/05/2017   DNR (do not resuscitate) 06/14/2017   Peripheral neuropathy 06/14/2017   Health maintenance examination 12/12/2016   Obesity, Class I, BMI 30-34.9 12/12/2016   Advanced care planning/counseling discussion 10/30/2014   Benign prostatic hyperplasia 10/30/2014   CKD stage 3 due to type 2 diabetes mellitus (HCC) 07/08/2013   Medicare annual wellness visit, subsequent 10/11/2011   Type 2 diabetes mellitus with other specified complication (HCC)    Hyperlipidemia associated with type 2 diabetes mellitus (HCC)    History of kidney stones    GERD (gastroesophageal reflux disease)    PCP:  Rilla Baller, MD [phone] [address] [place] - [address] [place] [address] Phone: [phone] Fax: [phone]     Social Drivers of Health (SDOH) Social History: SDOH Screenings   Food Insecurity: Unknown (03/06/2024)  Housing: Low Risk  (03/06/2024)  Transportation Needs: No Transportation Needs (03/06/2024)   Utilities: Not At Risk (03/06/2024)  Depression (PHQ2-9): Low Risk  (12/31/2023)  Financial Resource Strain: Low Risk  (12/15/2023)   Received from [facility]  Physical Activity: Unknown (10/08/2023)  Social Connections: Moderately Isolated (03/06/2024)  Stress: Patient Declined (10/08/2023)  Tobacco Use: Medium Risk (03/21/2024)   SDOH Interventions:     Readmission Risk Interventions    03/07/2024    3:03 PM  Readmission Risk Prevention Plan  Transportation Screening Complete  PCP or Specialist Appt within 3-5 Days Complete  Social Work Consult for Recovery Care Planning/Counseling Complete  Palliative Care Screening Not Applicable  Medication Review Ocean[facility]) Complete

## 2024-03-22 NOTE — Hospital Course (Signed)
 [name] Young. is a 83 y.o. male with medical history significant of recently diagnosed perihepatic abscess status post IR drainage, PAF on Eliquis , HTN, chronic HFpEF, CAD status post CABG x 3, CKD stage IIIa, IIDM, history of endocarditis with Streptococcus bacteremia, history of E. coli bacteremia, sent from SNF for evaluation of rectal bleeding.  He received 2 units of PRBC, still has significant rectal bleeding.  Hemoglobin has been stabilizing.  Eliquis  discontinued CT angiogram was performed on 7/12, did not identify any source of bleeding.  Since then, condition has been improving. Patient hemoglobin has been stable, no additional rectal bleeding.  Medically stable for discharge.  Will discontinue Eliquis  for now.

## 2024-03-22 NOTE — ED Notes (Signed)
 This NT assisted Paramedic Shawn with cleaning pt.

## 2024-03-22 NOTE — ED Notes (Signed)
 Pt has a MOST form at bedside that states no IV fluids... I advised the patient of said form but Pt requested to have the IV fluids and blood transfusion if needed

## 2024-03-22 NOTE — ED Notes (Signed)
 Assisted Paramedic change pt brief at this time.

## 2024-03-22 NOTE — ED Notes (Signed)
 Dr Lawence was contacted refer 2 bowel movements of bloodclots.SABRAand hypotension... Awaiting orders

## 2024-03-22 NOTE — ED Notes (Signed)
 Assisted paramedic change pt brief and sheets at this time.

## 2024-03-22 NOTE — Progress Notes (Addendum)
  Progress Note   Patient: [name] Young. FMW:[id number] DOB: 1940-10-20 DOA: 03/21/2024     1 DOS: the patient was seen and examined on 03/22/2024   Brief hospital course:  [name] Young. is a 83 y.o. male with medical history significant of recently diagnosed perihepatic abscess status post IR drainage, PAF on Eliquis , HTN, chronic HFpEF, CAD status post CABG x 3, CKD stage IIIa, IIDM, history of endocarditis with Streptococcus bacteremia, history of E. coli bacteremia, sent from SNF for evaluation of rectal bleeding.  He received 2 units of PRBC, still has significant rectal bleeding.  Hemoglobin has been stabilizing.  Eliquis  discontinued   Principal Problem:   Lower GI bleed Active Problems:   PAF (paroxysmal atrial fibrillation) (HCC)   Essential hypertension   Obesity, Class I, BMI 30-34.9   Coronary artery disease   Chronic heart failure with mildly reduced ejection fraction (HFmrEF, 41-49%) (HCC)   Malnutrition of moderate degree   Acute blood loss anemia   Assessment and Plan: Acute blood loss anemia secondary to rectal bleeding. Lower GI bleed most likely secondary to diverticulosis. Hypotension secondary to GI bleed. B12 deficiency anemia. Patient still has rectal bleeding, requiring 2 units of PRBC.  Hemoglobin is improving.  Patient had significant hypotension at the time of admission required fluid bolus and transfusion.  Blood pressure is better. In history, patient was taking B12, restarted. Patient had an additional episodes of rectal bleeding last night and this morning, have obtained CT angiogram for GI bleed protocol, patient may need IR intervention if bleeding continues. GI bleed is likely worsened by Eliquis , which is discontinued. Will continue monitor hemoglobin, transfuse as needed.  Addendum: CT abdomen/pelvis bleeding scan did not show any evidence of bleeding.  Looks like bleeding is better.  No need for intervention  Recent Enterococcus  faecalis bacteremia and perihepatic abscess formation - Status post drainage - Continue Augmentin , last dose 7/21.  Paroxysmal atrial fibrillation. Eliquis  will be discontinued,  Chronic diastolic congestive heart failure. Hold off on diuretics due to low blood pressure.  Class I obesity. Diet and excise advised    Subjective:  Patient has additional episodes of rectal bleeding last night and this morning.  No abdominal pain or nausea vomiting.  Physical Exam: Vitals:   03/22/24 0930 03/22/24 1000 03/22/24 1020 03/22/24 1030  BP: (!) 98/41 (!) 87/53 (!) 98/59 (!) 102/44  Pulse: 89 86 87 (!) 42  Resp: 18 17 18 18   Temp:   98.1 F (36.7 C)   TempSrc:   Oral   SpO2: 100% 100% 100% 94%  Weight:      Height:       General exam: Appears calm and comfortable  Respiratory system: Clear to auscultation. Respiratory effort normal. Cardiovascular system: S1 & S2 heard, RRR. No JVD, murmurs, rubs, gallops or clicks. No pedal edema. Gastrointestinal system: Abdomen is nondistended, soft and nontender. No organomegaly or masses felt. Normal bowel sounds heard. Central nervous system: Alert and oriented x3. No focal neurological deficits. Extremities: Symmetric 5 x 5 power. Skin: No rashes, lesions or ulcers Psychiatry:  Mood & affect appropriate.    Data Reviewed:  Reviewed lab results.  Family Communication: Wife updated at bedside  Disposition: Status is: Inpatient Remains inpatient appropriate because: Severity of disease, IV treatment.     Time spent: 55 minutes  Author: Murvin Mana, MD 03/22/2024 11:31 AM  For on call review [email or web address].

## 2024-03-23 DIAGNOSIS — D62 Acute posthemorrhagic anemia: Secondary | ICD-10-CM | POA: Diagnosis not present

## 2024-03-23 DIAGNOSIS — K922 Gastrointestinal hemorrhage, unspecified: Secondary | ICD-10-CM | POA: Diagnosis not present

## 2024-03-23 DIAGNOSIS — I480 Paroxysmal atrial fibrillation: Secondary | ICD-10-CM | POA: Diagnosis not present

## 2024-03-23 LAB — TYPE AND SCREEN
ABO/RH(D): A POS
Antibody Screen: NEGATIVE
Unit division: 0
Unit division: 0

## 2024-03-23 LAB — CBC
HCT: 27 % — ABNORMAL LOW (ref 39.0–52.0)
Hemoglobin: 8.5 g/dL — ABNORMAL LOW (ref 13.0–17.0)
MCH: 27.2 pg (ref 26.0–34.0)
MCHC: 31.5 g/dL (ref 30.0–36.0)
MCV: 86.3 fL (ref 80.0–100.0)
Platelets: 237 K/uL (ref 150–400)
RBC: 3.13 MIL/uL — ABNORMAL LOW (ref 4.22–5.81)
RDW: 18.7 % — ABNORMAL HIGH (ref 11.5–15.5)
WBC: 6.4 K/uL (ref 4.0–10.5)
nRBC: 0 % (ref 0.0–0.2)

## 2024-03-23 LAB — BASIC METABOLIC PANEL WITH GFR
Anion gap: 7 (ref 5–15)
BUN: 37 mg/dL — ABNORMAL HIGH (ref 8–23)
CO2: 26 mmol/L (ref 22–32)
Calcium: 7.8 mg/dL — ABNORMAL LOW (ref 8.9–10.3)
Chloride: 104 mmol/L (ref 98–111)
Creatinine, Ser: 0.91 mg/dL (ref 0.61–1.24)
GFR, Estimated: >60 mL/min (ref >60)
Glucose, Bld: 97 mg/dL (ref 70–99)
Potassium: 4.3 mmol/L (ref 3.5–5.1)
Sodium: 137 mmol/L (ref 135–145)

## 2024-03-23 LAB — MAGNESIUM: Magnesium: 2.1 mg/dL (ref 1.7–2.4)

## 2024-03-23 LAB — BPAM RBC
Blood Product Expiration Date: 202508112359
Blood Product Expiration Date: 202508122359
ISSUE DATE / TIME: 202507120453
ISSUE DATE / TIME: 202507120816
Unit Type and Rh: 6200
Unit Type and Rh: 6200

## 2024-03-23 LAB — HEMOGLOBIN
Hemoglobin: 8.2 g/dL — ABNORMAL LOW (ref 13.0–17.0)
Hemoglobin: 8.6 g/dL — ABNORMAL LOW (ref 13.0–17.0)

## 2024-03-23 MED ORDER — RISAQUAD PO CAPS
1.0000 | ORAL_CAPSULE | Freq: Two times a day (BID) | ORAL | Status: DC
  Administered 2024-03-23 – 2024-03-24 (×3): 1 via ORAL
  Filled 2024-03-23 (×3): qty 1

## 2024-03-23 MED ORDER — BISACODYL 5 MG PO TBEC: 10.0000 mg | DELAYED_RELEASE_TABLET | Freq: Every day | ORAL | Status: DC | PRN

## 2024-03-23 NOTE — Progress Notes (Signed)
 Progress Note   Patient: [name] Young. FMW:[id number] DOB: 1941/07/04 DOA: 03/21/2024     2 DOS: the patient was seen and examined on 03/23/2024   Brief hospital course:  [name] Young. is a 83 y.o. male with medical history significant of recently diagnosed perihepatic abscess status post IR drainage, PAF on Eliquis , HTN, chronic HFpEF, CAD status post CABG x 3, CKD stage IIIa, IIDM, history of endocarditis with Streptococcus bacteremia, history of E. coli bacteremia, sent from SNF for evaluation of rectal bleeding.  He received 2 units of PRBC, still has significant rectal bleeding.  Hemoglobin has been stabilizing.  Eliquis  discontinued CT angiogram was performed on 7/12, did not identify any source of bleeding.  Since then, condition has been improving.   Principal Problem:   Lower GI bleed Active Problems:   PAF (paroxysmal atrial fibrillation) (HCC)   Essential hypertension   Obesity, Class I, BMI 30-34.9   Coronary artery disease   Chronic heart failure with mildly reduced ejection fraction (HFmrEF, 41-49%) (HCC)   Malnutrition of moderate degree   Acute blood loss anemia   Assessment and Plan: Acute blood loss anemia secondary to rectal bleeding. Lower GI bleed most likely secondary to diverticulosis. Hypotension secondary to GI bleed. B12 deficiency anemia. Patient still has rectal bleeding, requiring 2 units of PRBC.  Hemoglobin is improving.  Patient had significant hypotension at the time of admission required fluid bolus and transfusion.  Blood pressure is better. In history, patient was taking B12, restarted.  Eliquis  discontinued. Patient had an additional episodes of rectal bleeding last night and this morning of 7/12.  As result, CT angiogram for bleeding protocol was performed, did not identify any source of bleeding. Patient has not had any additional bleeding since yesterday.  Hemoglobin stabilizing. Will keep patient 1 more day, if hemoglobin still  stable tomorrow, he can be transferred back to nursing home.  Recent Enterococcus faecalis bacteremia and perihepatic abscess formation - Status post drainage - Continue Augmentin , last dose 7/21.   Paroxysmal atrial fibrillation. Eliquis  will be discontinued,   Chronic diastolic congestive heart failure. Hold off on diuretics due to low blood pressure.   Class I obesity. Diet and excise advised           Subjective:  Patient doing better today, no additional bleeding.  Denies any shortness of breath.  Physical Exam: Vitals:   03/22/24 1605 03/22/24 1945 03/22/24 1950 03/23/24 0339  BP: (!) 93/57 (!) 144/124 (!) 100/35 95/60  Pulse: 74 (!) 43 67 81  Resp:  18 18 18   Temp:  98.3 F (36.8 C) 98.3 F (36.8 C) 98.1 F (36.7 C)  TempSrc:    Oral  SpO2:  100% 99% 99%  Weight:      Height:       General exam: Appears calm and comfortable  Respiratory system: Clear to auscultation. Respiratory effort normal. Cardiovascular system: S1 & S2 heard, RRR. No JVD, murmurs, rubs, gallops or clicks. No pedal edema. Gastrointestinal system: Abdomen is nondistended, soft and nontender. No organomegaly or masses felt. Normal bowel sounds heard. Central nervous system: Alert and oriented x3. No focal neurological deficits. Extremities: Symmetric 5 x 5 power. Skin: No rashes, lesions or ulcers Psychiatry: Judgement and insight appear normal. Mood & affect appropriate.    Data Reviewed:  Lab results reviewed.  Family Communication: Wife updated at bedside.  Disposition: Status is: Inpatient Remains inpatient appropriate because: Severity of disease     Time spent: 35 minutes  Author: Murvin Mana, MD 03/23/2024 10:18 AM  For on call review [email or web address].

## 2024-03-23 NOTE — Plan of Care (Signed)

## 2024-03-23 NOTE — Evaluation (Signed)
 Physical Therapy Evaluation Patient Details Name: [name] Young. MRN: [id number] DOB: 1941/02/02 Today's Date: 03/23/2024  History of Present Illness  [name] Young. is a 83 y.o. male with medical history significant of recently diagnosed perihepatic abscess status post IR drainage, PAF on Eliquis , HTN, chronic HFpEF, CAD status post CABG x 3, CKD stage IIIa, IIDM, history of endocarditis with Streptococcus bacteremia, history of E. coli bacteremia, sent from SNF for evaluation of rectal bleeding.   Clinical Impression  Pt admitted with above diagnosis. Pt currently with functional limitations due to the deficits listed below (see PT Problem List). Pt received supine in bed with spouse present agreeable to PT. Reports only ~5 days at [facility] prior to current admission. Reliant on staff at bed level for toileting and needed 1-2 person assist for STS and transfers to <>from manual w/c. Only had 1 group session with PT before return to hospital.   To date, vitals monitored. HR resting at 40 and elevates to 60-65 BPM with mobility. BP in supine prior to mobility 105/56 mm Hg. Pt denies dizziness or signs/symptoms during today's session.Pt fatigued but able to transfer with minA and bed features reliant on heavy BUE support to sit EoB. Pt able to scoot anterior to EoB with increased time and effort. X3 STS performed at EOB needing minA+1 and bed elevated. Each STS for first 2 reps for ~20 seconds. Unable to side step to R towards [facility] without LE's buckling thus deferred to sitting. Mod STS performed on last rep mainly to clear buttocks for removing dirty chuck pad and donning clean chuck pad. Pt reports significant fatigue. modA at LE's to return to supine and totalA+2 with NSG to scoot up in bed. Deferred transfers this date due to LE's buckling with side step attempts. Even unable to lateral scoot on bed surface thus deferring lateral scoot transfer as well. Pt and spouse in agreement upon d/c return  to STR due to acute weakness and functional mobility deficits still lending to high falls risk. Pt left in care of NSG.      If plan is discharge home, recommend the following: A lot of help with walking and/or transfers;A lot of help with bathing/dressing/bathroom;Assistance with cooking/housework;Assist for transportation;Help with stairs or ramp for entrance;Supervision due to cognitive status   Can travel by private vehicle   No    Equipment Recommendations None recommended by PT  Recommendations for Other Services       Functional Status Assessment Patient has had a recent decline in their functional status and demonstrates the ability to make significant improvements in function in a reasonable and predictable amount of time.     Precautions / Restrictions Precautions Precautions: Fall Precaution/Restrictions Comments: JP drain RLQ, monitor BP and HR Restrictions Weight Bearing Restrictions Per Provider Order: No      Mobility  Bed Mobility Overal bed mobility: Needs Assistance Bed Mobility: Supine to Sit, Sit to Supine     Supine to sit: Min assist, HOB elevated, Used rails Sit to supine: Mod assist   General bed mobility comments: modA at LE's to return to supine Patient Response: Cooperative  Transfers Overall transfer level: Needs assistance Equipment used: Rolling walker (2 wheels) Transfers: Sit to/from Stand Sit to Stand: From elevated surface, Min assist           General transfer comment: bouts of momentum. Mild posterior leaning. x3 STS at [facility]    Ambulation/Gait  General Gait Details: Pt unable to take side steps at EOB without LE buckling thus transfers and gait deferred  Stairs            Wheelchair Mobility     Tilt Bed Tilt Bed Patient Response: Cooperative  Modified Rankin (Stroke Patients Only)       Balance Overall balance assessment: Needs assistance Sitting-balance support: Feet supported, Single  extremity supported Sitting balance-Leahy Scale: Good     Standing balance support: Bilateral upper extremity supported Standing balance-Leahy Scale: Poor Standing balance comment: heavy BUE support required on RW for standing. LE's buckling with side step attempts                             Pertinent Vitals/Pain Pain Assessment Pain Assessment: Faces Faces Pain Scale: Hurts little more Pain Location: buttocks Pain Descriptors / Indicators: Aching, Discomfort, Tender, Sore Pain Intervention(s): Limited activity within patient's tolerance, Monitored during session, Repositioned    Home Living Family/patient expects to be discharged to:: Skilled nursing facility Living Arrangements: Spouse/significant other Available Help at Discharge: Family;Available 24 hours/day               Additional Comments: 1-2 person assist to transfer to w/c at [facility]. Only had a group therapy session before readmission. Using bed pan and urinal for toileting. Prior to admissions was mod-I using RW.    Prior Function                       Extremity/Trunk Assessment   Upper Extremity Assessment Upper Extremity Assessment: Overall WFL for tasks assessed    Lower Extremity Assessment Lower Extremity Assessment: Generalized weakness    Cervical / Trunk Assessment Cervical / Trunk Assessment: Kyphotic  Communication   Communication Communication: Impaired Factors Affecting Communication: Hearing impaired    Cognition Arousal: Alert Behavior During Therapy: WFL for tasks assessed/performed   PT - Cognitive impairments: No apparent impairments                         Following commands: Intact Following commands impaired: Only follows one step commands consistently     Cueing Cueing Techniques: Verbal cues, Gestural cues, Tactile cues     General Comments      Exercises Other Exercises Other Exercises: importance of regular positioning changes for  Buttocks pain/skin changes. Encouraged regular OOB mobility with NSG for LE strength/tolerance.   Assessment/Plan    PT Assessment Patient needs continued PT services  PT Problem List Decreased strength;Decreased activity tolerance;Decreased range of motion;Decreased balance;Decreased mobility;Decreased knowledge of use of DME;Decreased safety awareness       PT Treatment Interventions DME instruction;Gait training;Stair training;Functional mobility training;Therapeutic activities;Therapeutic exercise;Balance training;Patient/family education    PT Goals (Current goals can be found in the Care Plan section)  Acute Rehab PT Goals Patient Stated Goal: to return to STR PT Goal Formulation: With patient/family Time For Goal Achievement: 04/06/24 Potential to Achieve Goals: Good    Frequency Min 2X/week     Co-evaluation               AM-PAC PT 6 Clicks Mobility  Outcome Measure Help needed turning from your back to your side while in a flat bed without using bedrails?: A Lot Help needed moving from lying on your back to sitting on the side of a flat bed without using bedrails?: A Lot Help needed moving to and from a  bed to a chair (including a wheelchair)?: Total Help needed standing up from a chair using your arms (e.g., wheelchair or bedside chair)?: A Lot Help needed to walk in hospital room?: Total Help needed climbing 3-5 steps with a railing? : Total 6 Click Score: 9    End of Session Equipment Utilized During Treatment: Gait belt Activity Tolerance: Patient limited by fatigue Patient left: in bed;with call bell/phone within reach;with bed alarm set;with nursing/sitter in room;with family/visitor present Nurse Communication: Mobility status PT Visit Diagnosis: Muscle weakness (generalized) (M62.81);Repeated falls (R29.6);Other abnormalities of gait and mobility (R26.89);Unsteadiness on feet (R26.81)    Time: 9084-9064 PT Time Calculation (min) (ACUTE ONLY): 20  min   Charges:   PT Evaluation $PT Eval Moderate Complexity: 1 Mod   PT General Charges $$ ACUTE PT VISIT: 1 Visit        Tyner Codner M. Fairly IV, PT, DPT Physical Therapist- [facility] 03/23/2024, 10:00 AM

## 2024-03-24 DIAGNOSIS — K5730 Diverticulosis of large intestine without perforation or abscess without bleeding: Secondary | ICD-10-CM | POA: Diagnosis not present

## 2024-03-24 DIAGNOSIS — K650 Generalized (acute) peritonitis: Secondary | ICD-10-CM | POA: Diagnosis not present

## 2024-03-24 DIAGNOSIS — R41841 Cognitive communication deficit: Secondary | ICD-10-CM | POA: Diagnosis not present

## 2024-03-24 DIAGNOSIS — I4891 Unspecified atrial fibrillation: Secondary | ICD-10-CM | POA: Diagnosis not present

## 2024-03-24 DIAGNOSIS — D62 Acute posthemorrhagic anemia: Secondary | ICD-10-CM | POA: Diagnosis not present

## 2024-03-24 DIAGNOSIS — K5790 Diverticulosis of intestine, part unspecified, without perforation or abscess without bleeding: Secondary | ICD-10-CM | POA: Diagnosis not present

## 2024-03-24 DIAGNOSIS — Z4682 Encounter for fitting and adjustment of non-vascular catheter: Secondary | ICD-10-CM | POA: Diagnosis not present

## 2024-03-24 DIAGNOSIS — B952 Enterococcus as the cause of diseases classified elsewhere: Secondary | ICD-10-CM | POA: Diagnosis not present

## 2024-03-24 DIAGNOSIS — Z7401 Bed confinement status: Secondary | ICD-10-CM | POA: Diagnosis not present

## 2024-03-24 DIAGNOSIS — I130 Hypertensive heart and chronic kidney disease with heart failure and stage 1 through stage 4 chronic kidney disease, or unspecified chronic kidney disease: Secondary | ICD-10-CM | POA: Diagnosis not present

## 2024-03-24 DIAGNOSIS — E119 Type 2 diabetes mellitus without complications: Secondary | ICD-10-CM | POA: Diagnosis not present

## 2024-03-24 DIAGNOSIS — K750 Abscess of liver: Secondary | ICD-10-CM | POA: Diagnosis not present

## 2024-03-24 DIAGNOSIS — K5791 Diverticulosis of intestine, part unspecified, without perforation or abscess with bleeding: Secondary | ICD-10-CM | POA: Diagnosis not present

## 2024-03-24 DIAGNOSIS — B379 Candidiasis, unspecified: Secondary | ICD-10-CM | POA: Diagnosis not present

## 2024-03-24 DIAGNOSIS — M6281 Muscle weakness (generalized): Secondary | ICD-10-CM | POA: Diagnosis not present

## 2024-03-24 DIAGNOSIS — E1169 Type 2 diabetes mellitus with other specified complication: Secondary | ICD-10-CM | POA: Diagnosis not present

## 2024-03-24 DIAGNOSIS — K625 Hemorrhage of anus and rectum: Secondary | ICD-10-CM | POA: Diagnosis not present

## 2024-03-24 DIAGNOSIS — Z741 Need for assistance with personal care: Secondary | ICD-10-CM | POA: Diagnosis not present

## 2024-03-24 DIAGNOSIS — N1831 Chronic kidney disease, stage 3a: Secondary | ICD-10-CM | POA: Diagnosis not present

## 2024-03-24 DIAGNOSIS — I5032 Chronic diastolic (congestive) heart failure: Secondary | ICD-10-CM | POA: Diagnosis not present

## 2024-03-24 DIAGNOSIS — D500 Iron deficiency anemia secondary to blood loss (chronic): Secondary | ICD-10-CM | POA: Diagnosis not present

## 2024-03-24 DIAGNOSIS — I959 Hypotension, unspecified: Secondary | ICD-10-CM | POA: Diagnosis not present

## 2024-03-24 DIAGNOSIS — I10 Essential (primary) hypertension: Secondary | ICD-10-CM | POA: Diagnosis not present

## 2024-03-24 DIAGNOSIS — E440 Moderate protein-calorie malnutrition: Secondary | ICD-10-CM | POA: Diagnosis not present

## 2024-03-24 DIAGNOSIS — I5022 Chronic systolic (congestive) heart failure: Secondary | ICD-10-CM | POA: Diagnosis not present

## 2024-03-24 DIAGNOSIS — I509 Heart failure, unspecified: Secondary | ICD-10-CM | POA: Diagnosis not present

## 2024-03-24 DIAGNOSIS — R7881 Bacteremia: Secondary | ICD-10-CM | POA: Diagnosis not present

## 2024-03-24 DIAGNOSIS — B372 Candidiasis of skin and nail: Secondary | ICD-10-CM | POA: Diagnosis not present

## 2024-03-24 DIAGNOSIS — M7989 Other specified soft tissue disorders: Secondary | ICD-10-CM | POA: Diagnosis not present

## 2024-03-24 DIAGNOSIS — I480 Paroxysmal atrial fibrillation: Secondary | ICD-10-CM | POA: Diagnosis not present

## 2024-03-24 DIAGNOSIS — F32A Depression, unspecified: Secondary | ICD-10-CM | POA: Diagnosis not present

## 2024-03-24 DIAGNOSIS — K922 Gastrointestinal hemorrhage, unspecified: Secondary | ICD-10-CM | POA: Diagnosis not present

## 2024-03-24 DIAGNOSIS — N179 Acute kidney failure, unspecified: Secondary | ICD-10-CM | POA: Diagnosis not present

## 2024-03-24 DIAGNOSIS — Z982 Presence of cerebrospinal fluid drainage device: Secondary | ICD-10-CM | POA: Diagnosis not present

## 2024-03-24 DIAGNOSIS — R2689 Other abnormalities of gait and mobility: Secondary | ICD-10-CM | POA: Diagnosis not present

## 2024-03-24 LAB — HEMOGLOBIN: Hemoglobin: 8.4 g/dL — ABNORMAL LOW (ref 13.0–17.0)

## 2024-03-24 MED ORDER — RISAQUAD PO CAPS: 1.0000 | ORAL_CAPSULE | Freq: Two times a day (BID) | ORAL | Status: DC

## 2024-03-24 MED ORDER — GERHARDT'S BUTT CREAM: 1.0000 | TOPICAL_CREAM | Freq: Two times a day (BID) | CUTANEOUS | 0 refills | Status: DC

## 2024-03-24 NOTE — Discharge Summary (Addendum)
 Physician Discharge Summary   Patient: [name] Young. MRN: [id number] DOB: November 14, 1940  Admit date:     03/21/2024  Discharge date: 03/24/24  Discharge Physician: Murvin Mana   PCP: Rilla Baller, MD   Recommendations at discharge:   Follow-up with PCP in 1 week. CBC in 1 week.  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 *  Hospital Course:  [name] Young. is a 83 y.o. male with medical history significant of recently diagnosed perihepatic abscess status post IR drainage, PAF on Eliquis , HTN, chronic HFpEF, CAD status post CABG x 3, CKD stage IIIa, IIDM, history of endocarditis with Streptococcus bacteremia, history of E. coli bacteremia, sent from SNF for evaluation of rectal bleeding.  He received 2 units of PRBC, still has significant rectal bleeding.  Hemoglobin has been stabilizing.  Eliquis  discontinued CT angiogram was performed on 7/12, did not identify any source of bleeding.  Since then, condition has been improving. Patient hemoglobin has been stable, no additional rectal bleeding.  Medically stable for discharge.  Will discontinue Eliquis  for now.  Assessment and Plan:  Acute blood loss anemia secondary to rectal bleeding. Lower GI bleed most likely secondary to diverticulosis. Hypotension with hemorrhagic shock secondary to GI bleed. B12 deficiency anemia. Patient had a persistent hypotension due to GI bleed.  Required transfusion and fluid bolus.  Condition consistent with hemorrhagic shock. Patient still has rectal bleeding, requiring 2 units of PRBC.  Hemoglobin is improving.  Patient had  significant hypotension at the time of admission required fluid bolus and transfusion.  Blood pressure is better. In history, patient was taking B12, restarted.  Eliquis  discontinued. Patient had an additional episodes of rectal bleeding last night and this morning of 7/12.  As result, CT angiogram for bleeding protocol was performed, did not identify any source of bleeding. Patient has not had any additional bleeding since yesterday.  Hemoglobin stabilizing. Has no additional bleeding, medically stable for discharge.   Recent Enterococcus faecalis bacteremia and perihepatic abscess formation - Status post drainage - Continue Augmentin , last dose 7/21.   Paroxysmal atrial fibrillation. Eliquis  will be discontinued,   Chronic diastolic congestive heart failure. Patient beta-blocker and ACE inhibitor was on hold due to low blood pressure.  Midodrine  was also discontinued.  Currently blood pressure is stable, restart home dose diuretics.   Class I obesity. Diet and excise advised            Consultants: None Procedures performed: None  Disposition: Skilled nursing facility Diet recommendation:  Discharge Diet Orders (From admission, onward)     Start     Ordered   03/24/24 0000  Diet - low sodium heart healthy        03/24/24 1344           Cardiac diet DISCHARGE MEDICATION: Allergies as of 03/24/2024   No Known Allergies      Medication List     STOP taking these medications    Eliquis  5 MG Tabs tablet Generic drug: apixaban    lisinopril  2.5 MG tablet Commonly known as: ZESTRIL    metoprolol  tartrate 25 MG tablet Commonly known as: LOPRESSOR   midodrine  10 MG tablet Commonly known as: PROAMATINE    nystatin  ointment Commonly known as: MYCOSTATIN        TAKE these medications    acidophilus Caps capsule Take 1 capsule by mouth 2 (two) times daily.   amoxicillin -clavulanate 875-125 MG tablet Commonly known as: AUGMENTIN  Take 1 tablet by mouth 2  (two) times daily for 14 days.   aspirin  EC 81 MG tablet Take 81 mg by mouth daily. Swallow whole.   B-12 1000 MCG Caps Take 1 capsule by mouth every Monday, Wednesday, and Friday.   bisacodyl  5 MG EC tablet Commonly known as: DULCOLAX Take 10 mg by mouth at bedtime.   feeding supplement Liqd Take 237 mLs by mouth 2 (two) times daily between meals. What changed: when to take this   ferrous sulfate  325 (65 FE) MG EC tablet Take 1 tablet (325 mg total) by mouth daily.   Gerhardt's butt cream Crea Apply 1 Application topically 2 (two) times daily.   Magnesium  250 MG Tabs Take 250 mg by mouth daily.   mirtazapine  30 MG tablet Commonly known as: REMERON  Take 1 tablet (30 mg total) by mouth at bedtime.   polyethylene glycol powder 17 GM/SCOOP powder Commonly known as: GLYCOLAX /MIRALAX  Take 8.5-17 g by mouth daily as needed for moderate constipation.   PRESERVISION AREDS 2 PO Take 1 capsule by mouth daily.   rosuvastatin  10 MG tablet Commonly known as: CRESTOR  TAKE 1 TABLET BY MOUTH EVERY DAY   spironolactone  25 MG tablet Commonly known as: ALDACTONE  Take 25 mg by mouth daily.   tamsulosin  0.4 MG Caps capsule Commonly known as: FLOMAX  Take 1 capsule (0.4 mg total) by mouth daily.   torsemide  20 MG tablet Commonly known as: DEMADEX  Take 1 tablet (20 mg total) by mouth every other day.   True Metrix Blood Glucose Test test strip Generic drug: glucose blood Use as instructed to check blood sugar once a day   Vitamin D3 25 MCG (1000 UT) Caps Take 1 capsule (1,000 Units total) by mouth daily.               Discharge Care Instructions  (From admission, onward)           Start     Ordered   03/24/24 0000  Discharge wound care:       Comments: Wound care  Until discontinued      Comments: Apply Gerhardts' cream to buttocks BID and PRN when turning or cleaning   03/24/24 1344            Follow-up Information     Rilla Baller, MD Follow up in  1 week(s).   Specialty: Family Medicine Contact information: [address] [place] [address] [phone]                Discharge Exam: Fredricka Weights   03/21/24 1058  Weight: 95.5 kg   General exam: Appears calm and comfortable  Respiratory system: Clear to auscultation. Respiratory effort normal. Cardiovascular system: S1 & S2 heard, RRR. No JVD, murmurs, rubs, gallops or clicks. No pedal edema. Gastrointestinal system: Abdomen is nondistended, soft and nontender. No organomegaly or masses felt. Normal bowel sounds heard. Central nervous system: Alert and oriented. No focal neurological deficits. Extremities: Symmetric 5 x 5 power. Skin: No rashes, lesions or ulcers Psychiatry: Judgement and insight appear normal. Mood & affect appropriate.    Condition at discharge: good  The results of significant diagnostics from this hospitalization (including imaging, microbiology, ancillary  and laboratory) are listed below for reference.   Imaging Studies: CT ANGIO GI BLEED Result Date: 03/22/2024 CLINICAL DATA:  Acute rectal bleeding beginning today. EXAM: CTA ABDOMEN AND PELVIS WITHOUT AND WITH CONTRAST TECHNIQUE: Multidetector CT imaging of the abdomen and pelvis was performed using the standard protocol during bolus administration of intravenous contrast. Multiplanar reconstructed images and MIPs were obtained and reviewed to evaluate the vascular anatomy. RADIATION DOSE REDUCTION: This exam was performed according to the departmental dose-optimization program which includes automated exposure control, adjustment of the mA and/or kV according to patient size and/or use of iterative reconstruction technique. CONTRAST:  75mL OMNIPAQUE  IOHEXOL  350 MG/ML SOLN COMPARISON:  03/08/2024 FINDINGS: VASCULAR Aorta: Normal caliber aorta without aneurysm, dissection, vasculitis or significant stenosis. Aortic atherosclerotic calcification incidentally noted. Celiac: Patent without  evidence of aneurysm, dissection, vasculitis or significant stenosis. SMA: Patent without evidence of aneurysm, dissection, vasculitis or significant stenosis. Renals: Both renal arteries are patent without evidence of aneurysm, dissection, vasculitis, fibromuscular dysplasia or significant stenosis. IMA: Patent without evidence of aneurysm, dissection, vasculitis or significant stenosis. Inflow: Patent without evidence of aneurysm, dissection, vasculitis or significant stenosis. Veins: No obvious venous abnormality within the limitations of this arterial phase study. Review of the MIP images confirms the above findings. NON-VASCULAR Lung bases: Stable small right pleural effusion, with pigtail catheter again seen in right posterior pleural space. Hepatobiliary: No suspicious hepatic masses identified. Prior cholecystectomy. No evidence of biliary obstruction. Pancreas:  No mass or inflammatory changes. Spleen: Within normal limits in size and appearance. Adrenals/Urinary Tract: No suspicious masses identified. Several benign-appearing renal cysts again seen bilaterally (No followup imaging is recommended). 3 mm calculus noted in midpole of right kidney. No evidence of ureteral calculi or hydronephrosis. Unremarkable unopacified urinary bladder. Stomach/Bowel: No evidence of obstruction, inflammatory process or abnormal fluid collections. Extensive colonic diverticulosis is again seen, without signs of diverticulitis. No evidence of sentinel blood clot within bowel. No evidence of active contrast extravasation/GI bleeding. Vascular/Lymphatic: No pathologically enlarged lymph nodes. No acute vascular findings. Reproductive:  Mildly enlarged prostate. Other:  None. Musculoskeletal:  No suspicious bone lesions identified. Review of the MIP images confirms the above findings. IMPRESSION: VASCULAR No evidence of active GI bleeding. NON-VASCULAR No acute findings. Colonic diverticulosis, without radiographic evidence of  diverticulitis. Mildly enlarged prostate. Tiny right renal calculus. No evidence of ureteral calculi or hydronephrosis. Stable small right pleural effusion, with pigtail catheter in place. Electronically Signed   By: Norleen DELENA Kil M.D.   On: 03/22/2024 11:38   ECHO TEE Result Date: 03/12/2024    TRANSESOPHOGEAL ECHO REPORT   Patient Name:   [name] Young. Date of Exam: 03/12/2024 Medical Rec #:  [id number]            Height:       70.0 in Accession #:    [id number]           Weight:       216.3 lb Date of Birth:  [birth year]           BSA:          2.158 m Patient Age:    82 years             BP:           104/63 mmHg Patient Gender: M                    HR:           89 bpm.  Exam Location:  [facility] Procedure: Transesophageal Echo, Color Doppler and Cardiac Doppler (Both            Spectral and Color Flow Doppler were utilized during procedure). Indications:     Bacteremia R78.81  History:         Patient has prior history of Echocardiogram examinations, most                  recent 03/11/2024. CAD, Prior CABG; Signs/Symptoms:Bacteremia.  Sonographer:     Ashley McNeely-Sloane Referring Phys:  [id number] [facility] L CAREY Diagnosing Phys: Redell Cave MD PROCEDURE: The transesophogeal probe was passed without difficulty through the esophogus of the patient. Sedation performed by different physician. The patient developed no complications during the procedure.  IMPRESSIONS  1. Left ventricular ejection fraction, by estimation, is 55 to 60%. The left ventricle has normal function.  2. Right ventricular systolic function is normal. The right ventricular size is normal.  3. No left atrial/left atrial appendage thrombus was detected.  4. The mitral valve is normal in structure. Mild mitral valve regurgitation.  5. The aortic valve is tricuspid. Aortic valve regurgitation is mild. Aortic valve sclerosis/calcification is present, without any evidence of aortic stenosis. Conclusion(s)/Recommendation(s): No evidence of  vegetation/infective endocarditis on this transesophageael echocardiogram. FINDINGS  Left Ventricle: Left ventricular ejection fraction, by estimation, is 55 to 60%. The left ventricle has normal function. The left ventricular internal cavity size was normal in size. Right Ventricle: The right ventricular size is normal. No increase in right ventricular wall thickness. Right ventricular systolic function is normal. Left Atrium: Left atrial size was normal in size. No left atrial/left atrial appendage thrombus was detected. Right Atrium: Right atrial size was normal in size. Pericardium: There is no evidence of pericardial effusion. Mitral Valve: The mitral valve is normal in structure. Mild mitral valve regurgitation. Tricuspid Valve: The tricuspid valve is normal in structure. Tricuspid valve regurgitation is mild. Aortic Valve: The aortic valve is tricuspid. Aortic valve regurgitation is mild. Aortic valve sclerosis/calcification is present, without any evidence of aortic stenosis. Pulmonic Valve: The pulmonic valve was normal in structure. Pulmonic valve regurgitation is trivial. Aorta: The aortic root is normal in size and structure. IAS/Shunts: No atrial level shunt detected by color flow Doppler. Redell Cave MD Electronically signed by Redell Cave MD Signature Date/Time: 03/12/2024/2:49:23 PM    Final    ECHOCARDIOGRAM COMPLETE Result Date: 03/11/2024    ECHOCARDIOGRAM REPORT   Patient Name:   [name] Young. Date of Exam: 03/11/2024 Medical Rec #:  [id number]            Height:       70.0 in Accession #:    [id number]           Weight:       216.3 lb Date of Birth:  1940/11/14           BSA:          2.158 m Patient Age:    82 years             BP:           104/61 mmHg Patient Gender: M                    HR:           67 bpm. Exam Location:  [facility] Procedure: 2D Echo, Cardiac Doppler and Color Doppler (Both Spectral and Color  Flow Doppler were utilized during procedure). Indications:      Bacteremia R78.81  History:         Patient has prior history of Echocardiogram examinations, most                  recent 11/22/2023.  Sonographer:     [name] Furnace Referring Phys:  [id number] DAVID P FITZGERALD Diagnosing Phys: Lonni Hanson MD IMPRESSIONS  1. Left ventricular ejection fraction, by estimation, is 55 to 60%. The left ventricle has normal function. Left ventricular endocardial border not optimally defined to evaluate regional wall motion. There is mild left ventricular hypertrophy. Left ventricular diastolic parameters are consistent with Grade I diastolic dysfunction (impaired relaxation).  2. Right ventricular systolic function is normal. The right ventricular size is normal. Mildly increased right ventricular wall thickness.  3. The mitral valve is abnormal. Trivial mitral valve regurgitation. No evidence of mitral stenosis.  4. The aortic valve has an indeterminant number of cusps. There is mild thickening of the aortic valve. Aortic valve regurgitation is mild. Mild to moderate aortic valve stenosis. Aortic valve area, by VTI measures 1.53 cm. Aortic valve mean gradient measures 16.0 mmHg.  5. Aortic dilatation noted. There is borderline dilatation of the aortic root, measuring 40 mm. Comparison(s): A prior study was performed on 11/22/2023. No significant change from prior study. FINDINGS  Left Ventricle: Left ventricular ejection fraction, by estimation, is 55 to 60%. The left ventricle has normal function. Left ventricular endocardial border not optimally defined to evaluate regional wall motion. The left ventricular internal cavity size was normal in size. There is mild left ventricular hypertrophy. Left ventricular diastolic parameters are consistent with Grade I diastolic dysfunction (impaired relaxation). Right Ventricle: The right ventricular size is normal. Mildly increased right ventricular wall thickness. Right ventricular systolic function is normal. Left Atrium: Left atrial size was  normal in size. Right Atrium: Right atrial size was normal in size. Pericardium: The pericardium was not well visualized. Mitral Valve: The mitral valve is abnormal. There is severe thickening of the mitral valve leaflet(s). Mild mitral annular calcification. Trivial mitral valve regurgitation. No evidence of mitral valve stenosis. MV peak gradient, 5.7 mmHg. The mean mitral valve gradient is 2.0 mmHg. Tricuspid Valve: The tricuspid valve is not well visualized. Tricuspid valve regurgitation is not demonstrated. Aortic Valve: The aortic valve has an indeterminant number of cusps. There is mild thickening of the aortic valve. Aortic valve regurgitation is mild. Mild to moderate aortic stenosis is present. Aortic valve mean gradient measures 16.0 mmHg. Aortic valve peak gradient measures 27.7 mmHg. Aortic valve area, by VTI measures 1.53 cm. Pulmonic Valve: The pulmonic valve was not well visualized. Pulmonic valve regurgitation is not visualized. No evidence of pulmonic stenosis. Aorta: Aortic dilatation noted. There is borderline dilatation of the aortic root, measuring 40 mm. Pulmonary Artery: The pulmonary artery is not well seen. Venous: The inferior vena cava was not well visualized. IAS/Shunts: The interatrial septum was not well visualized.  LEFT VENTRICLE PLAX 2D LVIDd:         5.20 cm   Diastology LVIDs:         3.60 cm   LV e' medial:    5.87 cm/s LV PW:         1.20 cm   LV E/e' medial:  12.2 LV IVS:        1.20 cm   LV e' lateral:   7.29 cm/s LVOT diam:     2.20 cm  LV E/e' lateral: 9.8 LV SV:         76 LV SV Index:   35 LVOT Area:     3.80 cm  RIGHT VENTRICLE RV Basal diam:  2.80 cm RV Mid diam:    2.00 cm RV S prime:     12.30 cm/s TAPSE (M-mode): 2.4 cm LEFT ATRIUM             Index        RIGHT ATRIUM           Index LA diam:        3.60 cm 1.67 cm/m   RA Area:     12.30 cm LA Vol (A2C):   61.0 ml 28.27 ml/m  RA Volume:   24.70 ml  11.45 ml/m LA Vol (A4C):   50.2 ml 23.26 ml/m LA Biplane Vol:  56.9 ml 26.37 ml/m  AORTIC VALVE AV Area (Vmax):    1.32 cm AV Area (Vmean):   1.26 cm AV Area (VTI):     1.53 cm AV Vmax:           263.00 cm/s AV Vmean:          184.667 cm/s AV VTI:            0.498 m AV Peak Grad:      27.7 mmHg AV Mean Grad:      16.0 mmHg LVOT Vmax:         91.00 cm/s LVOT Vmean:        61.300 cm/s LVOT VTI:          0.200 m LVOT/AV VTI ratio: 0.40  AORTA Ao Root diam: 4.00 cm MITRAL VALVE                TRICUSPID VALVE MV Area (PHT): 2.38 cm     TR Peak grad:   33.4 mmHg MV Area VTI:   2.71 cm     TR Vmax:        289.00 cm/s MV Peak grad:  5.7 mmHg MV Mean grad:  2.0 mmHg     SHUNTS MV Vmax:       1.19 m/s     Systemic VTI:  0.20 m MV Vmean:      68.4 cm/s    Systemic Diam: 2.20 cm MV Decel Time: 319 msec MV E velocity: 71.80 cm/s MV A velocity: 128.00 cm/s MV E/A ratio:  0.56 Lonni End MD Electronically signed by Lonni Hanson MD Signature Date/Time: 03/11/2024/3:15:40 PM    Final    CT ANGIO ABDOMEN W &/OR WO CONTRAST Result Date: 03/08/2024 CLINICAL DATA:  Left upper quadrant pain. Perihepatic drain with active bleeding. EXAM: CT ANGIOGRAPHY ABDOMEN TECHNIQUE: Multidetector CT imaging of the abdomen was performed using the standard protocol during bolus administration of intravenous contrast. Multiplanar reconstructed images and MIPs were obtained and reviewed to evaluate the vascular anatomy. RADIATION DOSE REDUCTION: This exam was performed according to the departmental dose-optimization program which includes automated exposure control, adjustment of the mA and/or kV according to patient size and/or use of iterative reconstruction technique. CONTRAST:  OMNIPAQUE  IOHEXOL  350 MG/ML SOLN COMPARISON:  03/06/2024. FINDINGS: VASCULAR Aorta: Normal caliber aorta without aneurysm, dissection, vasculitis or significant stenosis. Aortic atherosclerosis. Celiac: Patent without evidence of aneurysm, dissection, vasculitis or significant stenosis. SMA: Patent without evidence of  aneurysm, dissection, vasculitis or significant stenosis. Renals: Both renal arteries are patent without evidence of aneurysm, dissection, vasculitis, fibromuscular dysplasia or significant stenosis. IMA: Patent without  evidence of aneurysm, dissection, vasculitis or significant stenosis. Inflow: Patent without evidence of aneurysm, dissection, vasculitis or significant stenosis. Veins: No obvious venous abnormality within the limitations of this arterial phase study. Review of the MIP images confirms the above findings. NON-VASCULAR Lower chest: There is a trace right pleural effusion with with associated pleural thickening, unchanged. Atelectasis is noted at the lung bases bilaterally. Coronary artery calcifications are noted. Hepatobiliary: No focal abnormality is seen within the liver. The gallbladder is surgically absent. No biliary ductal dilatation is seen. There is a perihepatic complex collection with a drain terminating in the posterior perihepatic space. The collection continues into the lateral abdominal wall on the right. No evidence of contrast extravasation is seen to suggest active hemorrhage. Pancreas: Unremarkable. No pancreatic ductal dilatation or surrounding inflammatory changes. Spleen: Normal in size without focal abnormality. Adrenals/Urinary Tract: The adrenal glands are within normal limits. The kidneys enhance symmetrically. Renal calculi are noted on the right. Hypodensities are noted in the kidneys bilaterally, likely cysts. No hydronephrosis bilaterally. Stomach/Bowel: There is a small hiatal hernia. No bowel obstruction, free air, or pneumatosis is seen. Scattered diverticula are present along the colon without evidence of diverticulitis. Lymphatic: Prominent lymph node is noted at the porta pedis, likely reactive. Other: Fat stranding is noted in the abdomen with a small amount of perihepatic free fluid. There is subcutaneous fat stranding and skin thickening along the right lateral  abdominal wall. Musculoskeletal: Strongly wires are noted. Degenerative changes are present in the thoracolumbar spine. No acute osseous abnormality is seen. IMPRESSION: VASCULAR 1. No evidence of active hemorrhage. 2. Aortic atherosclerosis. NON-VASCULAR 1. Complex collection at the right lung base extending into the. Paddock space and right abdominal wall, decreased in size from the prior exam. Percutaneous drain placement is noted posteriorly. No active hemorrhage is seen. 2. Remaining findings are unchanged. Electronically Signed   By: Leita Birmingham M.D.   On: 03/08/2024 19:07   CT GUIDED PERITONEAL/RETROPERITONEAL FLUID DRAIN BY PERC CATH Result Date: 03/07/2024 INDICATION: 83 year old with history of right hemithorax and now has a large complex fluid collection in the right lateral abdominal wall and right perihepatic space. EXAM: CT and ultrasound-guided placement of drainage catheter in the right lateral abdominal wall collection TECHNIQUE: Multidetector CT imaging of the abdomen was performed following the standard protocol without IV contrast. RADIATION DOSE REDUCTION: This exam was performed according to the departmental dose-optimization program which includes automated exposure control, adjustment of the mA and/or kV according to patient size and/or use of iterative reconstruction technique. MEDICATIONS: Moderate sedation ANESTHESIA/SEDATION: Moderate (conscious) sedation was employed during this procedure. A total of Versed  2 mg and Fentanyl  100 mcg was administered intravenously by the radiology nurse. Total intra-service moderate Sedation Time: 14 minutes. The patient's level of consciousness and vital signs were monitored continuously by radiology nursing throughout the procedure under my direct supervision. COMPLICATIONS: None immediate. PROCEDURE: Informed written consent was obtained from the patient after a thorough discussion of the procedural risks, benefits and alternatives. All questions  were addressed. Maximal Sterile Barrier Technique was utilized including caps, mask, sterile gowns, sterile gloves, sterile drape, hand hygiene and skin antiseptic. A timeout was performed prior to the initiation of the procedure. Patient was placed on his left side. CT images through the abdomen were obtained. Complex collection involving the lateral right abdominal wall and perihepatic space was identified. This area was also evaluated with ultrasound. The right lateral abdomen was prepped with chlorhexidine  and sterile field was created. Skin was anesthetized  using 1% lidocaine . Small incision was made. Using ultrasound guidance, 18 gauge trocar needle was directed into the lateral superficial component. Yellow purulent fluid was aspirated. Superstiff Amplatz wire was advanced into the collection. Wire appeared to be extending into the perihepatic region as well. Tract was dilated to accommodate a 12 [place] multipurpose drain. Initially, a yellow purulent fluid was removed and eventually turned into bloody fluid. Total of 450 mL of bloody purulent fluid was removed. Follow up CT and ultrasound images were obtained. Drain was sutured to skin and attached to a suction bulb. FINDINGS: Large complex fluid collection involving the lateral right abdominal wall and right perihepatic space. Twelve French drain was placed in the superficial component of the collection and extends into the perihepatic region. 450 mL of bloody purulent fluid was removed. Follow up CT and ultrasound images demonstrate residual hematoma but no other significant drainable fluid. IMPRESSION: CT and ultrasound-guided placement of a drainage catheter in the right lateral abdominal wall and right perihepatic fluid collection. 450 mL of bloody purulent fluid was removed and findings are suggestive for an infected hematoma. Electronically Signed   By: Juliene Balder M.D.   On: 03/07/2024 16:32   IR PATIENT EVAL TECH 0-60 MINS Result Date:  03/07/2024 Delight Bebe SAILOR     03/07/2024  4:05 PM Assistance with CT drain placement  CT ABDOMEN PELVIS W CONTRAST Result Date: 03/06/2024 CLINICAL DATA:  Perihepatic abscess. EXAM: CT ABDOMEN AND PELVIS WITH CONTRAST TECHNIQUE: Multidetector CT imaging of the abdomen and pelvis was performed using the standard protocol following bolus administration of intravenous contrast. RADIATION DOSE REDUCTION: This exam was performed according to the departmental dose-optimization program which includes automated exposure control, adjustment of the mA and/or kV according to patient size and/or use of iterative reconstruction technique. CONTRAST:  75mL OMNIPAQUE  IOHEXOL  300 MG/ML  SOLN COMPARISON:  CT chest and pelvis dated 02/25/2024. FINDINGS: Lower chest: Similar appearance of small right pleural effusion and right posterior pleural thickening or mass. Additional subpleural nodule in the right middle lobe similar to prior CT. Coronary vascular calcification. No intra-abdominal free air.  Small perihepatic free fluid. Hepatobiliary: Complex collection extending from the subcapsular posterior right lobe of the liver into the peritoneal surface posteriorly as well as posterolateral abdominal wall measures approximately 9.6 x 5.4 cm in greatest axial dimensions posterior to the liver and 14 x 7 cm in the right lateral abdominal wall and overall slightly increased in size since the prior CT. No biliary dilatation. Cholecystectomy. Pancreas: Unremarkable. No pancreatic ductal dilatation or surrounding inflammatory changes. Spleen: Normal in size without focal abnormality. Adrenals/Urinary Tract: The adrenal glands unremarkable. Right renal nonobstructing calculi measure up to 5 mm in the interpolar right kidney. No hydronephrosis. Bilateral renal cysts. The visualized ureters and urinary bladder appear unremarkable. Stomach/Bowel: Small hiatal hernia. There is moderate stool throughout the colon. There is sigmoid  diverticulosis. There is no bowel obstruction or active inflammation. The appendix is not visualized with certainty. No inflammatory changes identified in the right lower quadrant. Vascular/Lymphatic: Moderate aortoiliac atherosclerotic disease. The IVC is unremarkable. No portal venous gas. There is no adenopathy. Reproductive: The prostate and seminal vesicles are grossly unremarkable. Other: Small fat containing bilateral inguinal hernia. Musculoskeletal: Osteopenia with degenerative changes of the spine. No acute osseous pathology. IMPRESSION: 1. Slightly increased size of the complex collection extending from the subcapsular posterior right lobe of the liver into the posterior peritoneal surface and posterolateral abdominal wall. 2. Similar appearance of small right pleural effusion and  right posterior pleural thickening or mass. 3. Sigmoid diverticulosis. No bowel obstruction. 4. Right renal nonobstructing calculi. No hydronephrosis. 5.  Aortic Atherosclerosis (ICD10-I70.0). Electronically Signed   By: Vanetta Chou M.D.   On: 03/06/2024 14:34   DG Chest Port 1 View Result Date: 03/06/2024 CLINICAL DATA:  Sepsis. EXAM: PORTABLE CHEST 1 VIEW COMPARISON:  December 06, 2023. FINDINGS: The heart size and mediastinal contours are within normal limits. Status post coronary artery bypass graft. Both lungs are clear. The visualized skeletal structures are unremarkable. IMPRESSION: No active disease. Electronically Signed   By: Lynwood Landy Raddle M.D.   On: 03/06/2024 12:30   LONG TERM MONITOR (3-14 DAYS) Result Date: 03/05/2024   The patient was monitored for 7 days, 14 hours.   The predominant rhythm was sinus with an average rate of 85 bpm (range 58-120 bpm in sinus).   There were rare PACs and frequent PVCs (PVC burden ~30%).   917 episodes of nonsustained ventricular tachycardia were observed, lasting up to 10 beats with a maximum rate of 218 bpm.   102 supraventricular runs were observed, lasting up to 15  beats with a maximum rate of 160 bpm.   No sustained arrhythmia or prolonged pause was observed.   There were no patient triggered events. Predominantly sinus rhythm with frequent PVCs and numerous episodes of nonsustained ventricular tachycardia and brief supraventricular runs, as outlined above.  Consider electrophysiology consultation.   CT CHEST ABDOMEN PELVIS WO CONTRAST Result Date: 03/03/2024 CLINICAL DATA:  Congestive heart failure. Fluid retention and urinary frequency. EXAM: CT CHEST, ABDOMEN AND PELVIS WITHOUT CONTRAST TECHNIQUE: Multidetector CT imaging of the chest, abdomen and pelvis was performed following the standard protocol without IV contrast. RADIATION DOSE REDUCTION: This exam was performed according to the departmental dose-optimization program which includes automated exposure control, adjustment of the mA and/or kV according to patient size and/or use of iterative reconstruction technique. COMPARISON:  CT of the chest 11/23/2023. CT abdomen and pelvis 01/23/2023. FINDINGS: CT CHEST FINDINGS Cardiovascular: Heart is mildly enlarged. Aorta is normal in size. There is no pericardial effusion. There are atherosclerotic calcifications of the aorta and coronary arteries. Patient is status post cardiac surgery. Mediastinum/Nodes: No enlarged mediastinal, hilar, or axillary lymph nodes. Thyroid  gland, trachea, and esophagus demonstrate no significant findings. Lungs/Pleura: There is a small hiatal hernia. Right-sided pleural drain has been removed in the interval. Small loculated right pleural effusion has decreased in size. Hyperdense posterior right pleural mass has decreased in size now measuring up to 2.8 cm in thickness (previously 4.3 cm). There is no new focal lung infiltrate, pleural effusion or pneumothorax. Musculoskeletal: No acute fractures are seen. Sternotomy are present. Bilateral gynecomastia. CT ABDOMEN PELVIS FINDINGS Hepatobiliary: Subcapsular fluid collection adjacent to the  inferior right hepatic lobe also abuts the peritoneal surface. Is measures 6.9 x 7.5 x 4.4 cm and appears new from the prior study. Second tiny subcapsular fluid collection is seen measuring up to 4 mm in thickness image 2/58. Gallbladder surgically absent. No biliary ductal dilatation identified. Pancreas: Unremarkable. No pancreatic ductal dilatation or surrounding inflammatory changes. Spleen: Normal in size without focal abnormality. Adrenals/Urinary Tract: There are 2 right renal calculi measuring up to 3 mm. Bilateral renal cysts are present measuring up to 4 cm. No hydronephrosis. Adrenal glands and bladder are within normal limits. Stomach/Bowel: Stomach is within normal limits. Appendix appears normal. No evidence of bowel wall thickening, distention, or inflammatory changes. There is sigmoid and descending colon diverticulosis. The appendix is not visualized.  Vascular/Lymphatic: Aortic atherosclerosis. No enlarged abdominal or pelvic lymph nodes. Reproductive: Prostate gland is enlarged. Other: There is no ascites. There are small fat containing inguinal hernias. There is intramuscular wall thickening and overlying subcutaneous edema at the level of the right eleventh and twelfth rib adjacent to the right liver subcapsular fluid collection. This area measures approximately 5 x 13 by 9.7 cm. Musculoskeletal: No osseous erosion or acute fracture. IMPRESSION: 1. Interval removal of right-sided pleural drain with decrease in size of small loculated right pleural effusion. 2. Interval decrease in size of hyperdense posterior right pleural mass. 3. New subcapsular fluid collection adjacent to the inferior right hepatic lobe measuring up to 7.5 cm. Abscess is a possibility. Second tiny subcapsular fluid collection is seen. 4. Intramuscular wall thickening and overlying subcutaneous edema at the level of the right eleventh and twelfth rib adjacent to the right liver subcapsular fluid collection. Findings may be  related to hematoma, infection, or neoplasm. 5. Nonobstructing right renal calculi. 6. Colonic diverticulosis. 7. Prostatomegaly. 8. Aortic atherosclerosis. Aortic Atherosclerosis (ICD10-I70.0). Electronically Signed   By: Greig Pique M.D.   On: 03/03/2024 23:19    Microbiology: Results for orders placed or performed during the hospital encounter of 03/06/24  Blood Culture (routine x 2)     Status: Abnormal   Collection Time: 03/06/24 11:56 AM   Specimen: BLOOD LEFT ARM  Result Value Ref Range Status   Specimen Description   Final    BLOOD LEFT ARM Performed at [facility], [address], [place], [place] [address]    Special Requests   Final    BOTTLES DRAWN AEROBIC AND ANAEROBIC Blood Culture adequate volume Performed at [facility], [address] [place], [place] [address]    Culture  Setup Time   Final    GRAM POSITIVE COCCI ANAEROBIC BOTTLE ONLY CRITICAL RESULT CALLED TO, READ BACK BY AND VERIFIED WITH: ALEX CHAPPELL AT 0802 03/08/24.PMF Performed at [facility], [address], [place], [place] [address]    Culture (A)  Final    ENTEROCOCCUS FAECALIS VANCOMYCIN  RESISTANT ENTEROCOCCUS ISOLATED    Report Status 03/10/2024 FINAL  Final   Organism ID, Bacteria ENTEROCOCCUS FAECALIS  Final      Susceptibility   Enterococcus faecalis - MIC*    AMPICILLIN  <=2 SENSITIVE Sensitive     VANCOMYCIN  >=32 RESISTANT Resistant     GENTAMICIN SYNERGY SENSITIVE Sensitive     * ENTEROCOCCUS FAECALIS  Blood Culture ID Panel (Reflexed)     Status: Abnormal   Collection Time: 03/06/24 11:56 AM  Result Value Ref Range Status   Enterococcus faecalis DETECTED (A) NOT DETECTED Final    Comment: CRITICAL RESULT CALLED TO, READ BACK BY AND VERIFIED WITH: ALEX CHAPPELL AT 0802 03/08/24.PMF    Enterococcus Faecium NOT DETECTED NOT DETECTED Final   Listeria monocytogenes NOT DETECTED NOT DETECTED Final   Staphylococcus species NOT DETECTED NOT DETECTED Final    Staphylococcus aureus (BCID) NOT DETECTED NOT DETECTED Final   Staphylococcus epidermidis NOT DETECTED NOT DETECTED Final   Staphylococcus lugdunensis NOT DETECTED NOT DETECTED Final   Streptococcus species NOT DETECTED NOT DETECTED Final   Streptococcus agalactiae NOT DETECTED NOT DETECTED Final   Streptococcus pneumoniae NOT DETECTED NOT DETECTED Final   Streptococcus pyogenes NOT DETECTED NOT DETECTED Final   A.calcoaceticus-baumannii NOT DETECTED NOT DETECTED Final   Bacteroides fragilis NOT DETECTED NOT DETECTED Final   Enterobacterales NOT DETECTED NOT DETECTED Final   Enterobacter cloacae complex NOT DETECTED NOT  DETECTED Final   Escherichia coli NOT DETECTED NOT DETECTED Final   Klebsiella aerogenes NOT DETECTED NOT DETECTED Final   Klebsiella oxytoca NOT DETECTED NOT DETECTED Final   Klebsiella pneumoniae NOT DETECTED NOT DETECTED Final   Proteus species NOT DETECTED NOT DETECTED Final   Salmonella species NOT DETECTED NOT DETECTED Final   Serratia marcescens NOT DETECTED NOT DETECTED Final   Haemophilus influenzae NOT DETECTED NOT DETECTED Final   Neisseria meningitidis NOT DETECTED NOT DETECTED Final   Pseudomonas aeruginosa NOT DETECTED NOT DETECTED Final   Stenotrophomonas maltophilia NOT DETECTED NOT DETECTED Final   Candida albicans NOT DETECTED NOT DETECTED Final   Candida auris NOT DETECTED NOT DETECTED Final   Candida glabrata NOT DETECTED NOT DETECTED Final   Candida krusei NOT DETECTED NOT DETECTED Final   Candida parapsilosis NOT DETECTED NOT DETECTED Final   Candida tropicalis NOT DETECTED NOT DETECTED Final   Cryptococcus neoformans/gattii NOT DETECTED NOT DETECTED Final   Vancomycin  resistance DETECTED (A) NOT DETECTED Final    Comment: CRITICAL RESULT CALLED TO, READ BACK BY AND VERIFIED WITH: ALEX CHAPPELL AT 0802 03/08/24.PMF Performed at [facility], [address] [place], [place] [address]   Blood Culture (routine x 2)     Status: None    Collection Time: 03/06/24  2:25 PM   Specimen: BLOOD  Result Value Ref Range Status   Specimen Description BLOOD BLOOD LEFT HAND [facility]  Final   Special Requests   Final    BOTTLES DRAWN AEROBIC AND ANAEROBIC Blood Culture adequate volume   Culture   Final    NO GROWTH 5 DAYS Performed at [facility], [address] [place], [place] [address]    Report Status 03/11/2024 FINAL  Final  Aerobic/Anaerobic Culture w Gram Stain (surgical/deep wound)     Status: None   Collection Time: 03/07/24  4:00 PM   Specimen: Abscess  Result Value Ref Range Status   Specimen Description   Final    ABSCESS Performed at [facility], [address] [place], [place] [address]    Special Requests   Final    NONE Performed at [facility], [address] [place], [place] [address]    Gram Stain   Final    MODERATE WBC PRESENT, PREDOMINANTLY PMN MODERATE GRAM POSITIVE COCCI RARE GRAM NEGATIVE RODS Performed at [facility], [address], [place], [place] [address]    Culture   Final    ABUNDANT ENTEROCOCCUS FAECALIS ABUNDANT ESCHERICHIA COLI ABUNDANT PROTEUS MIRABILIS NO ANAEROBES ISOLATED VANCOMYCIN  RESISTANT ENTEROCOCCUS ISOLATED    Report Status 03/12/2024 FINAL  Final   Organism ID, Bacteria ESCHERICHIA COLI  Final   Organism ID, Bacteria PROTEUS MIRABILIS  Final   Organism ID, Bacteria ENTEROCOCCUS FAECALIS  Final      Susceptibility   Escherichia coli - MIC*    AMPICILLIN  <=2 SENSITIVE Sensitive     CEFEPIME  <=0.12 SENSITIVE Sensitive     CEFTAZIDIME  <=1 SENSITIVE Sensitive     CEFTRIAXONE  <=0.25 SENSITIVE Sensitive     CIPROFLOXACIN  <=0.25 SENSITIVE Sensitive     GENTAMICIN <=1 SENSITIVE Sensitive     IMIPENEM <=0.25 SENSITIVE Sensitive     TRIMETH/SULFA <=20 SENSITIVE Sensitive     AMPICILLIN /SULBACTAM <=2 SENSITIVE Sensitive     PIP/TAZO <=4 SENSITIVE Sensitive ug/mL    * ABUNDANT ESCHERICHIA COLI   Enterococcus faecalis - MIC*     AMPICILLIN  <=2 SENSITIVE Sensitive     VANCOMYCIN  >=32 RESISTANT Resistant  GENTAMICIN SYNERGY SENSITIVE Sensitive     * ABUNDANT ENTEROCOCCUS FAECALIS   Proteus mirabilis - MIC*    AMPICILLIN  <=2 SENSITIVE Sensitive     CEFEPIME  <=0.12 SENSITIVE Sensitive     CEFTAZIDIME  <=1 SENSITIVE Sensitive     CEFTRIAXONE  <=0.25 SENSITIVE Sensitive     CIPROFLOXACIN  <=0.25 SENSITIVE Sensitive     GENTAMICIN <=1 SENSITIVE Sensitive     IMIPENEM <=0.25 SENSITIVE Sensitive     TRIMETH/SULFA <=20 SENSITIVE Sensitive     AMPICILLIN /SULBACTAM <=2 SENSITIVE Sensitive     PIP/TAZO <=4 SENSITIVE Sensitive ug/mL    * ABUNDANT PROTEUS MIRABILIS  Culture, blood (single) w Reflex to ID Panel     Status: None   Collection Time: 03/10/24  5:29 PM   Specimen: BLOOD  Result Value Ref Range Status   Specimen Description BLOOD BLOOD LEFT ARM BLA  Final   Special Requests   Final    BOTTLES DRAWN AEROBIC AND ANAEROBIC Blood Culture adequate volume   Culture   Final    NO GROWTH 5 DAYS Performed at [facility], [address] [place], [place] [address]    Report Status 03/15/2024 FINAL  Final    Labs: CBC: Recent Labs  Lab 03/21/24 1104 03/21/24 2212 03/22/24 0125 03/22/24 1105 03/22/24 1549 03/23/24 0000 03/23/24 0456 03/23/24 1716 03/24/24 0444  WBC 7.4  --  7.1  --   --   --  6.4  --   --   HGB 8.9* 8.3* 8.3*   < > 9.9* 8.2* 8.5* 8.6* 8.4*  HCT 29.5* 28.4* 27.2*  --   --   --  27.0*  --   --   MCV 86.5  --  86.6  --   --   --  86.3  --   --   PLT 296  --  277  --   --   --  237  --   --    < > = values in this interval not displayed.   Basic Metabolic Panel: Recent Labs  Lab 03/21/24 1104 03/23/24 0456  NA 140 137  K 4.6 4.3  CL 100 104  CO2 28 26  GLUCOSE 124* 97  BUN 39* 37*  CREATININE 1.23 0.91  CALCIUM  8.4* 7.8*  MG  --  2.1   Liver Function Tests: No results for input(s): AST, ALT, ALKPHOS, BILITOT, PROT, ALBUMIN  in the last 168 hours. CBG: No  results for input(s): GLUCAP in the last 168 hours.  Discharge time spent: 35 minutes  Signed: Murvin Mana, MD [facility] 03/24/2024

## 2024-03-24 NOTE — Plan of Care (Signed)
  Problem: Education: Goal: Knowledge of General Education information will improve Description: Including pain rating scale, medication(s)/side effects and non-pharmacologic comfort measures Outcome: Adequate for Discharge   Problem: Clinical Measurements: Goal: Will remain free from infection Outcome: Adequate for Discharge Goal: Diagnostic test results will improve Outcome: Adequate for Discharge   Problem: Nutrition: Goal: Adequate nutrition will be maintained Outcome: Adequate for Discharge   Problem: Coping: Goal: Level of anxiety will decrease Outcome: Adequate for Discharge   Problem: Elimination: Goal: Will not experience complications related to bowel motility Outcome: Adequate for Discharge

## 2024-03-24 NOTE — Progress Notes (Signed)
 Called and spoke with [facility]. Report given.

## 2024-03-24 NOTE — Plan of Care (Signed)

## 2024-03-24 NOTE — Care Management Important Message (Signed)
 Important Message  Patient Details  Name: [name] Young. MRN: [id number] Date of Birth: 06-14-1941   Important Message Given:  Yes - Medicare IM     [name], CMA 03/24/2024, 2:47 PM

## 2024-03-24 NOTE — TOC Transition Note (Signed)
 Transition of Care [facility]) - Discharge Note   Patient Details  Name: [name] Young. MRN: [id number] Date of Birth: [birth year]  Transition [facility]) CM/SW Contact:  Racheal LITTIE Schimke, RN Phone Number: 03/24/2024, 1:39 PM   Clinical Narrative: Patient to discharge today back to [facility] room 1202. Nurse to call report [phone]. [facility] transport arranged. Wife notified.      Final next level of care: Skilled Nursing Facility Barriers to Discharge: Barriers Resolved   Patient Goals and CMS Choice Patient states their goals for this hospitalization and ongoing recovery are:: To return to [facility]   Choice offered to / list presented to :  (Pt and wife would like for pt to return to [facility] if possible, 2nd choice- [facility])      Discharge Placement                  Name of family member notified: [name], wife Patient and family notified of of transfer: 03/24/24  Discharge Plan and Services Additional resources added to the After Visit Summary for   In-house Referral: Clinical Social Work              DME Arranged: N/A DME Agency: NA       HH Arranged: NA HH Agency: NA        Social Drivers of Health (SDOH) Interventions SDOH Screenings   Food Insecurity: No Food Insecurity (03/22/2024)  Housing: Low Risk  (03/22/2024)  Transportation Needs: No Transportation Needs (03/22/2024)  Utilities: Not At Risk (03/22/2024)  Depression (PHQ2-9): Low Risk  (12/31/2023)  Financial Resource Strain: Low Risk  (12/15/2023)   Received from [facility]  Physical Activity: Unknown (10/08/2023)  Social Connections: Moderately Isolated (03/22/2024)  Stress: Patient Declined (10/08/2023)  Tobacco Use: Medium Risk (03/21/2024)     Readmission Risk Interventions    03/07/2024    3:03 PM  Readmission Risk Prevention Plan  Transportation Screening Complete  PCP or Specialist Appt within 3-5 Days Complete  Social Work  Consult for Recovery Care Planning/Counseling Complete  Palliative Care Screening Not Applicable  Medication Review Oceanographer) Complete

## 2024-03-24 NOTE — TOC Progression Note (Addendum)
 Transition of Care (TOC) - Progression Note    Patient Details  Name: [name] Young. MRN: [id number] Date of Birth: Apr 04, 1941  Transition of Care [facility]) CM/SW Contact  Racheal LITTIE Schimke, RN Phone Number: 03/24/2024, 9:01 AM  Clinical Narrative: [facility] spoke with Darian, about discharge, Shara is required. Process started. SNF approved 03/24/24-03/26/24 plan auth id #[id number] [facility] notified, left message, Hospitalist aware.    Expected Discharge Plan: Skilled Nursing Facility (for Rehab) Barriers to Discharge: Continued Medical Work up  Expected Discharge Plan and Services In-house Referral: Clinical Social Work     Living arrangements for the past 2 months: Single Family Home                                       Social Determinants of Health (SDOH) Interventions SDOH Screenings   Food Insecurity: No Food Insecurity (03/22/2024)  Housing: Low Risk  (03/22/2024)  Transportation Needs: No Transportation Needs (03/22/2024)  Utilities: Not At Risk (03/22/2024)  Depression (PHQ2-9): Low Risk  (12/31/2023)  Financial Resource Strain: Low Risk  (12/15/2023)   Received from [facility]  Physical Activity: Unknown (10/08/2023)  Social Connections: Moderately Isolated (03/22/2024)  Stress: Patient Declined (10/08/2023)  Tobacco Use: Medium Risk (03/21/2024)    Readmission Risk Interventions    03/07/2024    3:03 PM  Readmission Risk Prevention Plan  Transportation Screening Complete  PCP or Specialist Appt within 3-5 Days Complete  Social Work Consult for Recovery Care Planning/Counseling Complete  Palliative Care Screening Not Applicable  Medication Review Oceanographer) Complete

## 2024-03-25 DIAGNOSIS — K5791 Diverticulosis of intestine, part unspecified, without perforation or abscess with bleeding: Secondary | ICD-10-CM | POA: Diagnosis not present

## 2024-03-25 DIAGNOSIS — K750 Abscess of liver: Secondary | ICD-10-CM | POA: Diagnosis not present

## 2024-03-25 DIAGNOSIS — Z982 Presence of cerebrospinal fluid drainage device: Secondary | ICD-10-CM | POA: Diagnosis not present

## 2024-03-25 DIAGNOSIS — R2689 Other abnormalities of gait and mobility: Secondary | ICD-10-CM | POA: Diagnosis not present

## 2024-03-25 DIAGNOSIS — M6281 Muscle weakness (generalized): Secondary | ICD-10-CM | POA: Diagnosis not present

## 2024-03-25 DIAGNOSIS — K650 Generalized (acute) peritonitis: Secondary | ICD-10-CM | POA: Diagnosis not present

## 2024-03-25 DIAGNOSIS — I480 Paroxysmal atrial fibrillation: Secondary | ICD-10-CM | POA: Diagnosis not present

## 2024-03-25 DIAGNOSIS — Z741 Need for assistance with personal care: Secondary | ICD-10-CM | POA: Diagnosis not present

## 2024-03-25 DIAGNOSIS — I5022 Chronic systolic (congestive) heart failure: Secondary | ICD-10-CM | POA: Diagnosis not present

## 2024-03-25 DIAGNOSIS — I5032 Chronic diastolic (congestive) heart failure: Secondary | ICD-10-CM | POA: Diagnosis not present

## 2024-03-25 DIAGNOSIS — D62 Acute posthemorrhagic anemia: Secondary | ICD-10-CM | POA: Diagnosis not present

## 2024-03-25 DIAGNOSIS — I10 Essential (primary) hypertension: Secondary | ICD-10-CM | POA: Diagnosis not present

## 2024-03-25 DIAGNOSIS — F32A Depression, unspecified: Secondary | ICD-10-CM | POA: Diagnosis not present

## 2024-03-26 ENCOUNTER — Ambulatory Visit: Admitting: Internal Medicine

## 2024-03-26 DIAGNOSIS — I130 Hypertensive heart and chronic kidney disease with heart failure and stage 1 through stage 4 chronic kidney disease, or unspecified chronic kidney disease: Secondary | ICD-10-CM | POA: Diagnosis not present

## 2024-03-26 DIAGNOSIS — R7881 Bacteremia: Secondary | ICD-10-CM | POA: Diagnosis not present

## 2024-03-26 DIAGNOSIS — N1831 Chronic kidney disease, stage 3a: Secondary | ICD-10-CM | POA: Diagnosis not present

## 2024-03-26 DIAGNOSIS — B952 Enterococcus as the cause of diseases classified elsewhere: Secondary | ICD-10-CM | POA: Diagnosis not present

## 2024-03-26 DIAGNOSIS — I509 Heart failure, unspecified: Secondary | ICD-10-CM | POA: Diagnosis not present

## 2024-03-26 DIAGNOSIS — K650 Generalized (acute) peritonitis: Secondary | ICD-10-CM | POA: Diagnosis not present

## 2024-03-26 DIAGNOSIS — I480 Paroxysmal atrial fibrillation: Secondary | ICD-10-CM | POA: Diagnosis not present

## 2024-03-26 DIAGNOSIS — E119 Type 2 diabetes mellitus without complications: Secondary | ICD-10-CM | POA: Diagnosis not present

## 2024-03-26 DIAGNOSIS — K750 Abscess of liver: Secondary | ICD-10-CM | POA: Diagnosis not present

## 2024-03-26 DIAGNOSIS — D500 Iron deficiency anemia secondary to blood loss (chronic): Secondary | ICD-10-CM | POA: Diagnosis not present

## 2024-03-26 DIAGNOSIS — N179 Acute kidney failure, unspecified: Secondary | ICD-10-CM | POA: Diagnosis not present

## 2024-03-27 DIAGNOSIS — I5032 Chronic diastolic (congestive) heart failure: Secondary | ICD-10-CM | POA: Diagnosis not present

## 2024-03-27 DIAGNOSIS — R7881 Bacteremia: Secondary | ICD-10-CM | POA: Diagnosis not present

## 2024-03-27 DIAGNOSIS — K650 Generalized (acute) peritonitis: Secondary | ICD-10-CM | POA: Diagnosis not present

## 2024-03-27 DIAGNOSIS — I4891 Unspecified atrial fibrillation: Secondary | ICD-10-CM | POA: Diagnosis not present

## 2024-03-27 DIAGNOSIS — K5790 Diverticulosis of intestine, part unspecified, without perforation or abscess without bleeding: Secondary | ICD-10-CM | POA: Diagnosis not present

## 2024-03-27 DIAGNOSIS — K922 Gastrointestinal hemorrhage, unspecified: Secondary | ICD-10-CM | POA: Diagnosis not present

## 2024-03-28 DIAGNOSIS — K750 Abscess of liver: Secondary | ICD-10-CM | POA: Diagnosis not present

## 2024-03-28 DIAGNOSIS — R2689 Other abnormalities of gait and mobility: Secondary | ICD-10-CM | POA: Diagnosis not present

## 2024-03-28 DIAGNOSIS — M6281 Muscle weakness (generalized): Secondary | ICD-10-CM | POA: Diagnosis not present

## 2024-03-28 DIAGNOSIS — I5022 Chronic systolic (congestive) heart failure: Secondary | ICD-10-CM | POA: Diagnosis not present

## 2024-03-28 DIAGNOSIS — I480 Paroxysmal atrial fibrillation: Secondary | ICD-10-CM | POA: Diagnosis not present

## 2024-03-28 DIAGNOSIS — Z741 Need for assistance with personal care: Secondary | ICD-10-CM | POA: Diagnosis not present

## 2024-03-28 DIAGNOSIS — F32A Depression, unspecified: Secondary | ICD-10-CM | POA: Diagnosis not present

## 2024-03-31 DIAGNOSIS — K625 Hemorrhage of anus and rectum: Secondary | ICD-10-CM | POA: Diagnosis not present

## 2024-03-31 DIAGNOSIS — K5730 Diverticulosis of large intestine without perforation or abscess without bleeding: Secondary | ICD-10-CM | POA: Diagnosis not present

## 2024-03-31 DIAGNOSIS — B372 Candidiasis of skin and nail: Secondary | ICD-10-CM | POA: Diagnosis not present

## 2024-03-31 DIAGNOSIS — I5032 Chronic diastolic (congestive) heart failure: Secondary | ICD-10-CM | POA: Diagnosis not present

## 2024-03-31 DIAGNOSIS — K650 Generalized (acute) peritonitis: Secondary | ICD-10-CM | POA: Diagnosis not present

## 2024-03-31 DIAGNOSIS — I4891 Unspecified atrial fibrillation: Secondary | ICD-10-CM | POA: Diagnosis not present

## 2024-03-31 DIAGNOSIS — D62 Acute posthemorrhagic anemia: Secondary | ICD-10-CM | POA: Diagnosis not present

## 2024-03-31 NOTE — Progress Notes (Signed)
 Referring Physician(s): Dr. Alm Needle  Chief Complaint: The patient is seen in follow up today s/p 12 Fr perihepatic abscess drain placed on 6.27.25  History of present illness:  83 y.o. male outpatient. History of CHF, CAD s/p CABG, HTN, HLD, CKD, a fib (on eliquis ). Presented to the ED at [facility] on 6.26.25 with concerns for a perihepatic abscess found on outside imaging while being worked up for CHF exacerbation and fluid retention.. On 6.27.25  IR placed a 12 Fr abscess into the fluid collection. Per procedure notes the abscess drain was placed in the superficial component and appeared to be connected to the perihepatic component. 450 ml of bloody purulent fluid was aspirated. Cultures from the abscess drew enterococcus faecalis, e. Coli and proteus mirabilis. Patient was discharged on 7.14.25 on Augmentin  until 7.21.25. He is being followed by infectious disease. Last seen via video on 7.16.25   He presents today for follow-up CT scan and drain evaluation.  Output has been minimal and down trending. averaging about 3 mL/day.  He currently denies fever, headache, chest pain, dyspnea, cough, abdominal pain, nausea, vomiting or bleeding.     Past Medical History:  Diagnosis Date   Adenomatous colon polyp    Aortic atherosclerosis (HCC)    Aortic stenosis    a. 11/2023 Echo: mild-mod AS, mean grad .   Arthritis    Ascending aorta dilatation (HCC)    a.) TTE 11/23/2017: asc Ao measured 37 mm; b.) TTE 10/23/2021: Ao root measured 41 mm, asc Ao measured 38 mm; c.) TEE 10/28/2021: asc Ao measured 38 mm; d.) TTE 01/11/2022: Ao root 40 mm, asc Ao 39 mm   Ascending cholangitis 10/2021   BPH (benign prostatic hyperplasia)    CKD (chronic kidney disease), stage III (HCC)    Claustrophobia    Coronary artery disease    a.) LHC 12/21/2017: 75% mLAD, 90% D1, 80-99% OM1/2, CTO pRCA (L-R collaterals) --> CVTS consult. b.) 01/2018 CABG x 3 (LIMA->LAD, VG->D1, VG->RPL); c. 01/2024 Cath:  LM nl, LAD 20p, 8m, D1 70ost, 90, LCX 30ost, OM1 80, OM2 99- fills from collats from D1. RCA 100p CTO, RPAV 100 (fills via collats from 3rd septal), VG->RPDA nl, LIMA->LAD nl, VG->D1 nl. RHC PA 35/10 (8), PCWP 10, CO 6.5-->Med Rx.   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   In setting of bacteremia from ascending colangitis   GERD (gastroesophageal reflux disease)    Hemothorax on right 11/2023   HFrEF (heart failure with reduced ejection fraction) (HCC)    a.) TTE 11/23/2017: EF 50-55%, mod MAC, triv TR, G1DD; b.) TTE 10/23/2021: EF 40-45%, mild LAE, Ao sclerosis, triv MR, G2DD; c.) TEE 10/28/2021: EF 40-45%, glob HK, mobile vegitation on MV; d.) TTE 01/11/2022: EF 50-55%, mild LVH, RVE, Ao sclerosis, mild MR/AR, G1DD   History of cholelithiasis    History of kidney stones    ca ox [place] [facility]) now [place]   History of pneumonia    HLD (hyperlipidemia)    HTN (hypertension)    Ischemic cardiomyopathy    a. 12/2022 Echo: EF 50-55%; b. 11/2023 Echo: EF 40-45%, glob HK, mild LVH, GrI DD, nl RV fxn, mild MR, mild AI, mild-mod AS, Ao root 43mm.   Jaundice    age 76   Long term current use of anticoagulant    a.) apixaban    Paroxysmal atrial fibrillation (HCC)    a.) CHA2DS2VASc = 6 (age x 2,  HFrEF, HTN, vascular disease history, T2DM);  b.) rate/rhythm maintained on oral metoprolol  succinate; chronically anticoagulated with apixaban    Pleural effusion    a. 11/2023 CT chest: Large R ple effusion, partially loculated-->s/p pleurx.   Pneumonia    Right-sided carotid artery disease (HCC)    a.) carotid doppler 04/05/2022: 1-39% RICA   S/P CABG x 3    a.) LIMA-LAD, SVG-diagonal, SVG-PL branch of RCA   S/P cataract extraction and insertion of intraocular lens    T2DM (type 2 diabetes mellitus) (HCC) 2010    Past Surgical History:  Procedure Laterality Date   CARPAL TUNNEL RELEASE Bilateral    CATARACT EXTRACTION, BILATERAL     COLONOSCOPY  11/2012   12  adenomatous polyps, diverticulosis, rec rpt 1 yr [facility]ne)   COLONOSCOPY  12/2013   3 polyps, diverticulosis, rec rpt 3 yrs [facility]ne)   COLONOSCOPY  06/2019   6 polyps (TA), diverticulosis, f/u left open ended Marianne)   CORONARY ARTERY BYPASS GRAFT N/A 01/18/2018   Procedure: CORONARY ARTERY BYPASS GRAFTING (CABG) x 3; Using Left Internal Mammary Artery, and Right Greater Saphenous Vein harvested Endoscopically, Coronary Artery Endarterectomy;  Surgeon: [name] Hanford Coy, MD;  Location: [facility] OR;  Service: Open Heart Surgery;  Laterality: N/A;   ERCP N/A 10/23/2021   Procedure: ENDOSCOPIC RETROGRADE CHOLANGIOPANCREATOGRAPHY (ERCP);  Surgeon: Aneita Gwendlyn DASEN, MD;  Location: [facility] ENDOSCOPY;  Service: Endoscopy;  Laterality: N/A;   IR CATHETER TUBE CHANGE  01/08/2023   IR PATIENT EVAL TECH 0-60 MINS  03/07/2024   IR PERC PLEURAL DRAIN W/INDWELL CATH W/IMG GUIDE  11/20/2023   IR RADIOLOGIST EVAL & MGMT  01/23/2023   IR RADIOLOGIST EVAL & MGMT  04/01/2024   IR THORACENTESIS ASP PLEURAL SPACE W/IMG GUIDE  12/29/2022   KNEE CARTILAGE SURGERY Left    LEFT HEART CATH AND CORONARY ANGIOGRAPHY N/A 12/21/2017   Procedure: LEFT HEART CATH AND CORONARY ANGIOGRAPHY;  Surgeon: Mady Bruckner, MD;  Location: [facility] INVASIVE CV LAB;  Service: Cardiovascular;  Laterality: N/A;   LITHOTRIPSY     REMOVAL OF STONES  10/23/2021   Procedure: REMOVAL OF STONES;  Surgeon: Aneita Gwendlyn DASEN, MD;  Location: [facility] ENDOSCOPY;  Service: Endoscopy;;   RIGHT/LEFT HEART CATH AND CORONARY/GRAFT ANGIOGRAPHY Bilateral 01/25/2024   Procedure: RIGHT/LEFT HEART CATH AND CORONARY/GRAFT ANGIOGRAPHY;  Surgeon: Mady Bruckner, MD;  Location: [facility] INVASIVE CV LAB;  Service: Cardiovascular;  Laterality: Bilateral;   SPHINCTEROTOMY  10/23/2021   Procedure: SPHINCTEROTOMY;  Surgeon: Aneita Gwendlyn DASEN, MD;  Location: [facility] ENDOSCOPY;  Service: Endoscopy;;   TEE WITHOUT CARDIOVERSION N/A 01/18/2018   Procedure: TRANSESOPHAGEAL ECHOCARDIOGRAM (TEE);  Surgeon: [name] Hanford, Coy, MD;  Location: [facility] OR;  Service: Open Heart Surgery;  Laterality: N/A;   TEE WITHOUT CARDIOVERSION N/A 10/28/2021   Procedure: TRANSESOPHAGEAL ECHOCARDIOGRAM (TEE);  Surgeon: Mona Vinie BROCKS, MD;  Location: [facility] ENDOSCOPY;  Service: Cardiovascular;  Laterality: N/A;   TEE WITHOUT CARDIOVERSION N/A 03/12/2024   Procedure: ECHOCARDIOGRAM, TRANSESOPHAGEAL;  Surgeon: Darliss Rogue, MD;  Location: [facility] ORS;  Service: Cardiovascular;  Laterality: N/A;   UMBILICAL HERNIA REPAIR     with mesh    Allergies: Patient has no known allergies.  Medications: Prior to Admission medications   Medication Sig Start Date End Date Taking? Authorizing Provider  acidophilus (RISAQUAD) CAPS capsule Take 1 capsule by mouth 2 (two) times daily. 03/24/24   Laurita Pillion, MD  amoxicillin -clavulanate (AUGMENTIN ) 875-125 MG tablet Take 1 tablet by mouth 2 (two) times daily for 14 days. 03/17/24 03/31/24  Jens Durand, MD  aspirin  EC 81 MG tablet Take 81 mg by mouth daily. Swallow whole.    [provider]  bisacodyl  (DULCOLAX) 5 MG EC tablet Take 10 mg by mouth at bedtime.    [provider]  Cholecalciferol  (VITAMIN D3) 25 MCG (1000 UT) CAPS Take 1 capsule (1,000 Units total) by mouth daily. 05/24/20   Rilla Baller, MD  Cyanocobalamin  (B-12) 1000 MCG CAPS Take 1 capsule by mouth every Monday, Wednesday, and Friday. 07/16/23   Rilla Baller, MD  feeding supplement (ENSURE ENLIVE / ENSURE PLUS) LIQD Take 237 mLs by mouth 2 (two) times daily between meals. Patient taking differently: Take 237 mLs by mouth daily. 01/05/23   Josette Ade, MD  ferrous sulfate  325 (65 FE) MG EC tablet Take 1 tablet (325 mg total) by mouth daily. 01/09/24   End, Lonni, MD  glucose blood (TRUE METRIX BLOOD GLUCOSE TEST) test strip Use as instructed to check blood sugar once a day 03/06/23   Rilla Baller, MD  Magnesium  250 MG TABS Take 250 mg by mouth daily.    [provider]  mirtazapine   (REMERON ) 30 MG tablet Take 1 tablet (30 mg total) by mouth at bedtime. 12/31/23   Rilla Baller, MD  Multiple Vitamins-Minerals (PRESERVISION AREDS 2 PO) Take 1 capsule by mouth daily.    [provider]  Nystatin  (GERHARDT'S BUTT CREAM) CREA Apply 1 Application topically 2 (two) times daily. 03/24/24   Laurita Pillion, MD  polyethylene glycol powder (GLYCOLAX /MIRALAX ) 17 GM/SCOOP powder Take 8.5-17 g by mouth daily as needed for moderate constipation. 03/17/24   Jens Durand, MD  rosuvastatin  (CRESTOR ) 10 MG tablet TAKE 1 TABLET BY MOUTH EVERY DAY 12/19/23   Vivienne Lonni Ingle, NP  spironolactone  (ALDACTONE ) 25 MG tablet Take 25 mg by mouth daily. 12/19/23 03/21/24  [provider]  tamsulosin  (FLOMAX ) 0.4 MG CAPS capsule Take 1 capsule (0.4 mg total) by mouth daily. 07/16/23   Rilla Baller, MD  torsemide  (DEMADEX ) 20 MG tablet Take 1 tablet (20 mg total) by mouth every other day. 01/25/24 04/24/24  End, Lonni, MD     Family History  Problem Relation Age of Onset   Alzheimer's disease Mother    Breast cancer Mother        breast   Atrial fibrillation Mother    CAD Mother    Stroke Father 68   Diabetes Father    CAD Father    Colon polyps Sister    Colon polyps Brother    Rectal cancer Maternal Grandfather        rectal   Colon cancer Maternal Grandfather 88   Esophageal cancer Neg Hx    Stomach cancer Neg Hx     Social History   Socioeconomic History   Marital status: Married    Spouse name: [name]   Number of children: Not on file   Years of education: Not on file   Highest education level: 12th grade  Occupational History   Not on file  Tobacco Use   Smoking status: Never    Passive exposure: Past   Smokeless tobacco: Former    Types: Chew    Quit date: 09/08/2001  Vaping Use   Vaping status: Never Used  Substance and Sexual Activity   Alcohol use: Not Currently    Comment: occ   Drug use: No   Sexual activity: Not Currently  Other  Topics Concern   Not on file  Social History Narrative   Caffeine: occasional  Lives with wife, grandson Everette 2009)Occupation: retired, 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.Diet: some water, fruits/vegetables daily, avoids potatoes   Social Drivers of Health   Financial Resource Strain: Low Risk  (12/15/2023)   Received from [facility]   Overall Financial Resource Strain (CARDIA)    Difficulty of Paying Living Expenses: Not hard at all  Food Insecurity: No Food Insecurity (03/22/2024)   Hunger Vital Sign    Worried About Running Out of Food in the Last Year: Never true    Ran Out of Food in the Last Year: Never true  Transportation Needs: No Transportation Needs (03/22/2024)   PRAPARE - Administrator, Civil Service (Medical): No    Lack of Transportation (Non-Medical): No  Physical Activity: Unknown (10/08/2023)   Exercise Vital Sign    Days of Exercise per Week: 0 days    Minutes of Exercise per Session: Not on file  Stress: Patient Declined (10/08/2023)   [employer] of Occupational Health - Occupational Stress Questionnaire    Feeling of Stress : Patient declined  Social Connections: Moderately Isolated (03/22/2024)   Social Connection and Isolation Panel    Frequency of Communication with Friends and Family: Never    Frequency of Social Gatherings with Friends and Family: Once a week    Attends Religious Services: More than 4 times per year    Active Member of [employer] or Organizations: No    Attends Banker Meetings: Never    Marital Status: Married     Vital Signs: There were no vitals taken for this visit.  Physical Exam Vitals and nursing note reviewed.  Constitutional:      Appearance: He is well-developed.  HENT:     Head: Normocephalic.  Pulmonary:     Effort: Pulmonary effort is normal.  Abdominal:     Comments: Positive RUQ  drain to suction  Site is unremarkable with no erythema, edema,  tenderness, bleeding or drainage noted at exit site. Suture and stat lock in place. Dressing is clean dry and intact. < 15  ml of  clear colored fluid noted in  bulb suction device - gravity bag.   Musculoskeletal:        General: Normal range of motion.     Cervical back: Normal range of motion.  Skin:    General: Skin is dry.  Neurological:     Mental Status: He is alert and oriented to person, place, and time.     Imaging: IR Radiologist Eval & Mgmt Result Date: 04/01/2024 EXAM: ESTABLISHED PATIENT OFFICE VISIT CHIEF COMPLAINT: SEE NOTE IN EPIC HISTORY OF PRESENT ILLNESS: SEE NOTE IN EPIC REVIEW OF SYSTEMS: SEE NOTE IN EPIC PHYSICAL EXAMINATION: SEE NOTE IN EPIC ASSESSMENT AND PLAN: SEE NOTE IN EPIC Electronically Signed   By: Wilkie Lent M.D.   On: 04/01/2024 14:21    Labs:  CBC: Recent Labs    03/11/24 0302 03/21/24 1104 03/21/24 2212 03/22/24 0125 03/22/24 1105 03/23/24 0000 03/23/24 0456 03/23/24 1716 03/24/24 0444  WBC 9.9 7.4  --  7.1  --   --  6.4  --   --   HGB 9.0* 8.9* 8.3* 8.3*   < > 8.2* 8.5* 8.6* 8.4*  HCT 30.7* 29.5* 28.4* 27.2*  --   --  27.0*  --   --   PLT 323 296  --  277  --   --  237  --   --    < > =  values in this interval not displayed.    COAGS: Recent Labs    11/21/23 0902 03/07/24 0508  INR 1.4* 1.4*    BMP: Recent Labs    03/11/24 0302 03/15/24 0441 03/21/24 1104 03/23/24 0456  NA 139 140 140 137  K 4.8 4.2 4.6 4.3  CL 109 104 100 104  CO2 22 26 28 26   GLUCOSE 111* 98 124* 97  BUN 29* 21 39* 37*  CALCIUM  8.4* 8.5* 8.4* 7.8*  CREATININE 1.00 0.66 1.23 0.91  GFRNONAA >60 >60 59* >60    LIVER FUNCTION TESTS: Recent Labs    09/17/23 1427 11/13/23 1034 11/20/23 1220 11/21/23 0239 11/25/23 0443 12/31/23 1238 03/06/24 1122 03/08/24 0455  BILITOT 0.4  --  0.6  --   --   --  0.6 0.7  AST 28  --  37  --   --   --  42* 18  ALT 25  --  38  --   --   --  35 20  ALKPHOS 87  --  68  --   --   --  109 76  PROT 6.6  --   7.0  --   --   --  7.8 5.7*  ALBUMIN  3.6*   < > 2.4*   < > 1.9* 3.0* 2.2* 1.6*   < > = values in this interval not displayed.    Assessment:  83 y.o. male outpatient. History of CHF, CAD s/p CABG, HTN, HLD, CKD, a fib (on eliquis ). Presented to the ED at [facility] on 6.26.25 with concerns for a perihepatic abscess found on outside imaging while being worked up for CHF exacerbation and fluid retention.On 6.27.26 IR placed a 12 Fr abscess into the fluid collection. Per procedure notes the abscess drain was placed in the superficial component and appeared to be connected to the perihepatic component. 450 ml of bloody purulent fluid was aspirated. Cultures from the abscess drew enterococcus faecalis, e. Coli and proteus mirabilis. Patient was discharged on 7.14.25 on Augmentin  until 7.21.25. He is being followed by infectious disease. Last seen via video on 7.16.25   He presents today for follow-up CT scan and drain evaluation.  Output has been minimal and down trending. averaging about 3 mL/day.  He currently denies fever, headache, chest pain, dyspnea, cough, abdominal pain, nausea, vomiting or bleeding.    Mr Urquilla  presents today for a drain follow up. He continues to take Augmentin  and is currently stable and afebrile. RUQ drain is to  JP suction with minimal clear output noted to be in bulb device. Suture and stat locks are in place. No edema, tenderness, bleeding or drainage noted at exit site. Dressing is clean dry and intact. Follow-up CT of abdomen pelvis performed today reveals resolved perihepatic abscess with stable drain position. Case was reviewed by Dr. Wilkie Lent  and decision made to remove abdominal drain today.  The abdominal drain was removed in its entirety without immediate complications.  Gauze dressing applied to site.    Drain removed intact, no complications,  patient tolerated procedure well, dressing applied to exit site. Post-removal instructions: - Okay  to shower/sponge  bath 24 hours post-removal. - No submerging (swimming, bathing) for 7 days post-removal. - Keep the dressing/bandage on to take shower, take dressing/bandage off and pat dry  the area completely before placing a new dressing/bandage  - Look for signs and symptoms of infection such as reddening of skin, pus like drainage,     fever  and/or chills - Change dressing PRN until site fully healed.    Patient verbalized understanding.   Signed: Delon JAYSON Beagle, NP 04/01/2024, 2:24 PM   Please refer to Dr. Wilkie Lent attestation of this note for management and plan.

## 2024-04-01 ENCOUNTER — Ambulatory Visit
Admission: RE | Admit: 2024-04-01 | Discharge: 2024-04-01 | Disposition: A | Discharge status: Home / Self Care | Source: Ambulatory Visit | Attending: Infectious Diseases | Admitting: Infectious Diseases

## 2024-04-01 ENCOUNTER — Ambulatory Visit
Admission: RE | Admit: 2024-04-01 | Discharge: 2024-04-01 | Disposition: A | Discharge status: Home / Self Care | Source: Ambulatory Visit | Attending: Radiology | Admitting: Radiology

## 2024-04-01 ENCOUNTER — Inpatient Hospital Stay
Admission: RE | Admit: 2024-04-01 | Discharge: 2024-04-01 | Discharge status: Home / Self Care | Source: Ambulatory Visit | Attending: Infectious Diseases | Admitting: Infectious Diseases

## 2024-04-01 DIAGNOSIS — I480 Paroxysmal atrial fibrillation: Secondary | ICD-10-CM | POA: Diagnosis not present

## 2024-04-01 DIAGNOSIS — K750 Abscess of liver: Secondary | ICD-10-CM | POA: Diagnosis not present

## 2024-04-01 DIAGNOSIS — Z741 Need for assistance with personal care: Secondary | ICD-10-CM | POA: Diagnosis not present

## 2024-04-01 DIAGNOSIS — M7989 Other specified soft tissue disorders: Secondary | ICD-10-CM | POA: Diagnosis not present

## 2024-04-01 DIAGNOSIS — Z4682 Encounter for fitting and adjustment of non-vascular catheter: Secondary | ICD-10-CM | POA: Diagnosis not present

## 2024-04-01 DIAGNOSIS — R2689 Other abnormalities of gait and mobility: Secondary | ICD-10-CM | POA: Diagnosis not present

## 2024-04-01 DIAGNOSIS — K651 Peritoneal abscess: Secondary | ICD-10-CM

## 2024-04-01 DIAGNOSIS — M6281 Muscle weakness (generalized): Secondary | ICD-10-CM | POA: Diagnosis not present

## 2024-04-01 DIAGNOSIS — F32A Depression, unspecified: Secondary | ICD-10-CM | POA: Diagnosis not present

## 2024-04-01 DIAGNOSIS — I5022 Chronic systolic (congestive) heart failure: Secondary | ICD-10-CM | POA: Diagnosis not present

## 2024-04-01 HISTORY — PX: IR RADIOLOGIST EVAL & MGMT: IMG5224

## 2024-04-01 MED ORDER — IOPAMIDOL (ISOVUE-300) INJECTION 61%
100.0000 mL | Freq: Once | INTRAVENOUS | Status: AC | PRN
  Administered 2024-04-01: 100 mL via INTRAVENOUS

## 2024-04-02 DIAGNOSIS — I5032 Chronic diastolic (congestive) heart failure: Secondary | ICD-10-CM | POA: Diagnosis not present

## 2024-04-02 DIAGNOSIS — B379 Candidiasis, unspecified: Secondary | ICD-10-CM | POA: Diagnosis not present

## 2024-04-02 DIAGNOSIS — I4891 Unspecified atrial fibrillation: Secondary | ICD-10-CM | POA: Diagnosis not present

## 2024-04-02 DIAGNOSIS — K5730 Diverticulosis of large intestine without perforation or abscess without bleeding: Secondary | ICD-10-CM | POA: Diagnosis not present

## 2024-04-02 DIAGNOSIS — K922 Gastrointestinal hemorrhage, unspecified: Secondary | ICD-10-CM | POA: Diagnosis not present

## 2024-04-02 DIAGNOSIS — K650 Generalized (acute) peritonitis: Secondary | ICD-10-CM | POA: Diagnosis not present

## 2024-04-04 DIAGNOSIS — I5022 Chronic systolic (congestive) heart failure: Secondary | ICD-10-CM | POA: Diagnosis not present

## 2024-04-04 DIAGNOSIS — B372 Candidiasis of skin and nail: Secondary | ICD-10-CM | POA: Diagnosis not present

## 2024-04-04 DIAGNOSIS — K625 Hemorrhage of anus and rectum: Secondary | ICD-10-CM | POA: Diagnosis not present

## 2024-04-04 DIAGNOSIS — I4891 Unspecified atrial fibrillation: Secondary | ICD-10-CM | POA: Diagnosis not present

## 2024-04-04 DIAGNOSIS — K650 Generalized (acute) peritonitis: Secondary | ICD-10-CM | POA: Diagnosis not present

## 2024-04-04 DIAGNOSIS — M6281 Muscle weakness (generalized): Secondary | ICD-10-CM | POA: Diagnosis not present

## 2024-04-04 DIAGNOSIS — Z741 Need for assistance with personal care: Secondary | ICD-10-CM | POA: Diagnosis not present

## 2024-04-04 DIAGNOSIS — F32A Depression, unspecified: Secondary | ICD-10-CM | POA: Diagnosis not present

## 2024-04-04 DIAGNOSIS — K5730 Diverticulosis of large intestine without perforation or abscess without bleeding: Secondary | ICD-10-CM | POA: Diagnosis not present

## 2024-04-04 DIAGNOSIS — K750 Abscess of liver: Secondary | ICD-10-CM | POA: Diagnosis not present

## 2024-04-04 DIAGNOSIS — I480 Paroxysmal atrial fibrillation: Secondary | ICD-10-CM | POA: Diagnosis not present

## 2024-04-04 DIAGNOSIS — R2689 Other abnormalities of gait and mobility: Secondary | ICD-10-CM | POA: Diagnosis not present

## 2024-04-04 DIAGNOSIS — D62 Acute posthemorrhagic anemia: Secondary | ICD-10-CM | POA: Diagnosis not present

## 2024-04-07 ENCOUNTER — Telehealth: Payer: Self-pay

## 2024-04-07 DIAGNOSIS — M6281 Muscle weakness (generalized): Secondary | ICD-10-CM | POA: Diagnosis not present

## 2024-04-07 NOTE — Transitions of Care (Post Inpatient/ED Visit) (Signed)
 04/07/2024  Name: [name] Young. MRN: [id number] DOB: 1941/01/08  Today's TOC FU Call Status: Today's TOC FU Call Status:: Successful TOC FU Call Completed TOC FU Call Complete Date: 04/07/24 Patient's Name and Date of Birth confirmed.  Transition Care Management Follow-up Telephone Call Date of Discharge: 04/05/24 Discharge Facility: Other (Non-[facility] Facility) Name of Other (Non-[facility]) Discharge Facility: [name] Young Type of Discharge: Inpatient Admission Primary Inpatient Discharge Diagnosis:: Lower GI Bleed How have you been since you were released from the hospital?: Better Any questions or concerns?: No  Items Reviewed: Did you receive and understand the discharge instructions provided?: Yes (will bring discharge summary from facility to appointment) Medications obtained,verified, and reconciled?: Partial Review Completed Reason for Partial Mediation Review: wife not with medication at the time of call Any new allergies since your discharge?: No Dietary orders reviewed?: NA Do you have support at home?: Yes People in Home [RPT]: spouse Name of Support/Comfort Primary Source: [name] Young  Medications Reviewed Today: Medications Reviewed Today     Reviewed by [name] [name] NOVAK, LPN (Licensed Practical Nurse) on 04/07/24 at [phone]  Med List Status: <None>   Medication Order Taking? Sig Documenting Provider Last Dose Status Informant  acidophilus (RISAQUAD) CAPS capsule [id number] Yes Take 1 capsule by mouth 2 (two) times daily. [name] Pillion, MD  Active   aspirin  EC 81 MG tablet [id number] Yes Take 81 mg by mouth daily. Swallow whole. [provider]  Active Spouse/Significant Other, Nursing Home Medication Administration Guide (MAG)  bisacodyl  (DULCOLAX) 5 MG EC tablet [id number] Yes Take 10 mg by mouth at bedtime. [provider]  Active Spouse/Significant Other, Nursing Home Medication Administration Guide (MAG)  Cholecalciferol  (VITAMIN D3) 25  MCG (1000 UT) CAPS [id number] Yes Take 1 capsule (1,000 Units total) by mouth daily. [name] Baller, MD  Active Spouse/Significant Other, Nursing Home Medication Administration Guide (MAG)  Cyanocobalamin  (B-12) 1000 MCG CAPS [id number] Yes Take 1 capsule by mouth every Monday, Wednesday, and Friday. [name] Baller, MD  Active Spouse/Significant Other, Nursing Home Medication Administration Guide (MAG)  feeding supplement (ENSURE ENLIVE / ENSURE PLUS) LIQD [id number] Yes Take 237 mLs by mouth 2 (two) times daily between meals.  Patient taking differently: Take 237 mLs by mouth daily.   [name] Ade, MD  Active Spouse/Significant Other, Nursing Home Medication Administration Guide (MAG)  ferrous sulfate  325 (65 FE) MG EC tablet [id number] Yes Take 1 tablet (325 mg total) by mouth daily. End, Lonni, MD  Active Spouse/Significant Other, Nursing Home Medication Administration Guide (MAG)  glucose blood (TRUE METRIX BLOOD GLUCOSE TEST) test strip [id number] Yes Use as instructed to check blood sugar once a day [name] Baller, MD  Active Spouse/Significant Other, Nursing Home Medication Administration Guide (MAG)  Magnesium  250 MG TABS [id number] Yes Take 250 mg by mouth daily. [provider]  Active Spouse/Significant Other, Nursing Home Medication Administration Guide (MAG)  mirtazapine  (REMERON ) 30 MG tablet [id number] Yes Take 1 tablet (30 mg total) by mouth at bedtime. [name] Baller, MD  Active Spouse/Significant Other, Nursing Home Medication Administration Guide (MAG)  Multiple Vitamins-Minerals (PRESERVISION AREDS 2 PO) [id number] Yes Take 1 capsule by mouth daily. [provider]  Active Spouse/Significant Other, Nursing Home Medication Administration Guide (MAG)  Nystatin  ([name] Young'S BUTT CREAM) CREA [id number] Yes Apply 1 Application topically 2 (two) times daily. [name] Pillion, MD  Active   polyethylene glycol powder (GLYCOLAX /MIRALAX ) 17 GM/SCOOP powder [id number] Yes  Take 8.5-17 g by mouth daily as needed for moderate constipation. [name] Durand, MD  Active Spouse/Significant Other, Nursing Home Medication Administration Guide (MAG)  rosuvastatin  (CRESTOR ) 10 MG tablet [id number] Yes TAKE 1 TABLET BY MOUTH EVERY DAY [name] Young Ingle, NP  Active Spouse/Significant Other, Nursing Home Medication Administration Guide (MAG)  spironolactone  (ALDACTONE ) 25 MG tablet [id number] Yes Take 25 mg by mouth daily. [provider]  Active Spouse/Significant Other, Nursing Home Medication Administration Guide (MAG)  tamsulosin  (FLOMAX ) 0.4 MG CAPS capsule [id number] Yes Take 1 capsule (0.4 mg total) by mouth daily. [name] Baller, MD  Active Spouse/Significant Other, Nursing Home Medication Administration Guide (MAG)  torsemide  (DEMADEX ) 20 MG tablet [id number] Yes Take 1 tablet (20 mg total) by mouth every other day. End, Lonni, MD  Active Spouse/Significant Other, Nursing Home Medication Administration Guide (MAG)            Home Care and Equipment/Supplies: Were Home Health Services Ordered?: NA Any new equipment or medical supplies ordered?: NA  Functional Questionnaire: Do you need assistance with bathing/showering or dressing?: Yes Do you need assistance with meal preparation?: Yes Do you need assistance with eating?: No Do you have difficulty maintaining continence: No Do you need assistance with getting out of bed/getting out of a chair/moving?: Yes Do you have difficulty managing or taking your medications?: No  Follow up appointments reviewed: PCP Follow-up appointment confirmed?: Yes Date of PCP follow-up appointment?: 04/08/24 Follow-up Provider: Dr. Rilla [facility] Follow-up appointment confirmed?: NA Do you need transportation to your follow-up appointment?: No Do you understand care options if your condition(s) worsen?: Yes-patient verbalized understanding    SIGNATURE [name] Bloodgood, LPN [facility]  Advisor [facility] You Are. We Are. [facility] Direct Dial [phone]

## 2024-04-08 ENCOUNTER — Ambulatory Visit: Admitting: Family Medicine

## 2024-04-08 ENCOUNTER — Encounter: Payer: Self-pay | Admitting: Family Medicine

## 2024-04-08 VITALS — BP 100/60 | HR 88 | Temp 98.1°F | Ht 70.0 in | Wt 198.1 lb

## 2024-04-08 DIAGNOSIS — K922 Gastrointestinal hemorrhage, unspecified: Secondary | ICD-10-CM

## 2024-04-08 DIAGNOSIS — K650 Generalized (acute) peritonitis: Secondary | ICD-10-CM

## 2024-04-08 DIAGNOSIS — D62 Acute posthemorrhagic anemia: Secondary | ICD-10-CM

## 2024-04-08 DIAGNOSIS — I350 Nonrheumatic aortic (valve) stenosis: Secondary | ICD-10-CM | POA: Diagnosis not present

## 2024-04-08 DIAGNOSIS — E440 Moderate protein-calorie malnutrition: Secondary | ICD-10-CM | POA: Diagnosis not present

## 2024-04-08 DIAGNOSIS — I480 Paroxysmal atrial fibrillation: Secondary | ICD-10-CM | POA: Diagnosis not present

## 2024-04-08 DIAGNOSIS — R531 Weakness: Secondary | ICD-10-CM

## 2024-04-08 DIAGNOSIS — K750 Abscess of liver: Secondary | ICD-10-CM

## 2024-04-08 DIAGNOSIS — R627 Adult failure to thrive: Secondary | ICD-10-CM

## 2024-04-08 LAB — COMPREHENSIVE METABOLIC PANEL WITH GFR
ALT: 23 U/L (ref 0–53)
AST: 26 U/L (ref 0–37)
Albumin: 3.8 g/dL (ref 3.5–5.2)
Alkaline Phosphatase: 65 U/L (ref 39–117)
BUN: 38 mg/dL — ABNORMAL HIGH (ref 6–23)
CO2: 29 meq/L (ref 19–32)
Calcium: 9.6 mg/dL (ref 8.4–10.5)
Chloride: 97 meq/L (ref 96–112)
Creatinine, Ser: 1.15 mg/dL (ref 0.40–1.50)
GFR: 59.57 mL/min — ABNORMAL LOW (ref >60.00)
Glucose, Bld: 109 mg/dL — ABNORMAL HIGH (ref 70–99)
Potassium: 5.5 meq/L — ABNORMAL HIGH (ref 3.5–5.1)
Sodium: 137 meq/L (ref 135–145)
Total Bilirubin: 0.6 mg/dL (ref 0.2–1.2)
Total Protein: 8.2 g/dL (ref 6.0–8.3)

## 2024-04-08 LAB — CBC WITH DIFFERENTIAL/PLATELET
Basophils Absolute: 0 K/uL (ref 0.0–0.1)
Basophils Relative: 0.5 % (ref 0.0–3.0)
Eosinophils Absolute: 0.2 K/uL (ref 0.0–0.7)
Eosinophils Relative: 2.7 % (ref 0.0–5.0)
HCT: 32.9 % — ABNORMAL LOW (ref 39.0–52.0)
Hemoglobin: 10.6 g/dL — ABNORMAL LOW (ref 13.0–17.0)
Lymphocytes Relative: 24.5 % (ref 12.0–46.0)
Lymphs Abs: 1.8 K/uL (ref 0.7–4.0)
MCHC: 32.1 g/dL (ref 30.0–36.0)
MCV: 86.6 fl (ref 78.0–100.0)
Monocytes Absolute: 0.6 K/uL (ref 0.1–1.0)
Monocytes Relative: 7.6 % (ref 3.0–12.0)
Neutro Abs: 4.8 K/uL (ref 1.4–7.7)
Neutrophils Relative %: 64.7 % (ref 43.0–77.0)
Platelets: 267 K/uL (ref 150.0–400.0)
RBC: 3.8 Mil/uL — ABNORMAL LOW (ref 4.22–5.81)
RDW: 20.8 % — ABNORMAL HIGH (ref 11.5–15.5)
WBC: 7.4 K/uL (ref 4.0–10.5)

## 2024-04-08 NOTE — Assessment & Plan Note (Addendum)
 See above. Continue to encourage PO intake.  He is already on mirtazapine  30mg  nightly.

## 2024-04-08 NOTE — Assessment & Plan Note (Signed)
 No further rectal bleeding since home, eliquis  has been stopped.  Will remain off this. Update CBC.

## 2024-04-08 NOTE — Assessment & Plan Note (Signed)
 Sclerosis without stenosis on latest TEE 03/2024.

## 2024-04-08 NOTE — Assessment & Plan Note (Addendum)
 Recent return to home on Saturday.  Ongoing anorexia with dysgeusia and weight loss.  Encouraged continued protein intake, continue Ensure supplements.

## 2024-04-08 NOTE — Assessment & Plan Note (Signed)
 Sounds regular. Eliquis  on hold since recurrent rectal bleed.

## 2024-04-08 NOTE — Assessment & Plan Note (Signed)
 Update CBC.

## 2024-04-08 NOTE — Assessment & Plan Note (Signed)
 Worked with PT/OT at [facility].  Pending restart home health services with [facility].

## 2024-04-08 NOTE — Assessment & Plan Note (Signed)
 S/p IR aspiration followed by drain placement, removed 04/01/2024.  Appreciate ID care - completed augmentin  course.  Update labs today.

## 2024-04-08 NOTE — Patient Instructions (Addendum)
 Reach out to home health [facility] if they don't contact you over next few days.  Labs today  Continue current medicines.  Continue Ensure 2-3 cans/day.  Continue to encourage eating 3 times a day.

## 2024-04-08 NOTE — Progress Notes (Signed)
 Ph: [phone] Fax: [phone]   Patient ID: [name] Young., male    DOB: 1941/03/16, 83 y.o.   MRN: [id number]  This visit was conducted in person.  BP 100/60   Pulse 88   Temp 98.1 F (36.7 C) (Oral)   Ht 5' 10 (1.778 m)   Wt 198 lb 2 oz (89.9 kg)   SpO2 94%   BMI 28.43 kg/m   BP Readings from Last 3 Encounters:  04/08/24 100/60  03/24/24 (!) 106/47  03/17/24 115/65    CC: hosp/SNF f/u visit  Subjective:   HPI: [name] Young. is a 83 y.o. male presenting on 04/08/2024 for Hospitalization Follow-up (Pt was admitted to the hospital 6/26 Abscess on the liver. DC to [facility] on 7/7./Accompanied by his wife [name])   Recent hospitalization x2 for recurrent perihepatic abscess noted on outpatient imaging, initially treated with IV abx.  Hospital course complicated by AKI, enterococcus faecalis bacteremia, hypotension and acute blood loss anemia s/p 3u pRBC transfusion. TEE 03/12/2024 without signs of endocarditis.  Discharged on augmentin , ID outpatient follow up, IR outpatient follow up.   Subsequent hospitalization from SNF for rectal bleeding, again s/p 2u pRBC. Eliquis  discontinued. CT angio 03/22/2024 did not identify source of bleed.  Hospital records reviewed. Med rec performed.   Discharged to home from [facility] on 04/05/2024.   Metoprolol  25mg  and lisinopril  2.5mg  were held due to low blood pressures. Midodrine  was also discontinued. Torsemide  and spironolactone  were continued. Metoprolol  and lisinopril  were restarted at [facility].  Augmentin  course extended to 5d after drain removal (04/01/2024) - last dose was today.   Saw ID Dr Fitzgerald 03/26/2024 for televisit, note reviewed.   Since home, no further rectal bleeding.  Notes persistent weakness, unsteady with ambulation due to leg weakness. Poor appetite  - nothing tastes good.  Notes aversion to meat.  He is drinking 2-3 ensure/day.   Another 12 lbs down since last visit.   Home health  set  up with [facility] by SNF.  Other follow up appointments scheduled: 04/18/2024 with me, 05/07/2024 with Dr End cardiology ______________________________________________________________________ Hospital admission: 03/06/2024 Hospital discharge: 03/17/2024 - to [facility] SNF   Perihepatic abscess [facility])   Hospital admission: again 03/21/2024 Hospital discharge: 03/26/2024 - back to [facility]  Lower GI bleed   Discharge home from SNF: 04/05/2024 TCM f/u phone call:  performed on 04/07/2024  Recommendations at discharge:  Follow-up with PCP in 1 week. CBC in 1 week.   Discharge Diagnoses: Principal Problem:   Lower GI bleed Active Problems:   PAF (paroxysmal atrial fibrillation) (HCC)   Essential hypertension   Obesity, Class I, BMI 30-34.9   Coronary artery disease   Chronic heart failure with mildly reduced ejection fraction (HFmrEF, 41-49%) (HCC)   Malnutrition of moderate degree   Acute blood loss anemia     Relevant past medical, surgical, family and social history reviewed and updated as indicated. Interim medical history since our last visit reviewed. Allergies and medications reviewed and updated. Outpatient Medications Prior to Visit  Medication Sig Dispense Refill   amoxicillin -clavulanate (AUGMENTIN ) 875-125 MG tablet Take 1 tablet by mouth 2 (two) times daily.     lisinopril  (ZESTRIL ) 2.5 MG tablet Take 1 tablet (2.5 mg total) by mouth daily.     Magnesium  Oxide -Mg Supplement 250 MG TABS Take 1 tablet by mouth daily.     metoprolol  tartrate (LOPRESSOR ) 25 MG tablet Take 0.5 tablets (12.5 mg total) by mouth 2 (two)  times daily.     acidophilus (RISAQUAD) CAPS capsule Take 1 capsule by mouth 2 (two) times daily.     aspirin  EC 81 MG tablet Take 81 mg by mouth daily. Swallow whole.     bisacodyl  (DULCOLAX) 5 MG EC tablet Take 10 mg by mouth at bedtime.     Cholecalciferol  (VITAMIN D3) 25 MCG (1000 UT) CAPS Take 1 capsule (1,000 Units total) by mouth daily. 30 capsule     Cyanocobalamin  (B-12) 1000 MCG CAPS Take 1 capsule by mouth every Monday, Wednesday, and Friday.     feeding supplement (ENSURE ENLIVE / ENSURE PLUS) LIQD Take 237 mLs by mouth 2 (two) times daily between meals. (Patient taking differently: Take 237 mLs by mouth daily.) 14220 mL 0   ferrous sulfate  325 (65 FE) MG EC tablet Take 1 tablet (325 mg total) by mouth daily. 90 tablet 1   glucose blood (TRUE METRIX BLOOD GLUCOSE TEST) test strip Use as instructed to check blood sugar once a day 100 each 3   Magnesium  250 MG TABS Take 250 mg by mouth daily.     mirtazapine  (REMERON ) 30 MG tablet Take 1 tablet (30 mg total) by mouth at bedtime. 90 tablet 1   Multiple Vitamins-Minerals (PRESERVISION AREDS 2 PO) Take 1 capsule by mouth daily.     Nystatin  (GERHARDT'S BUTT CREAM) CREA Apply 1 Application topically 2 (two) times daily. 1 each 0   polyethylene glycol powder (GLYCOLAX /MIRALAX ) 17 GM/SCOOP powder Take 8.5-17 g by mouth daily as needed for moderate constipation.     rosuvastatin  (CRESTOR ) 10 MG tablet TAKE 1 TABLET BY MOUTH EVERY DAY 90 tablet 2   spironolactone  (ALDACTONE ) 25 MG tablet Take 25 mg by mouth daily.     tamsulosin  (FLOMAX ) 0.4 MG CAPS capsule Take 1 capsule (0.4 mg total) by mouth daily. 90 capsule 4   torsemide  (DEMADEX ) 20 MG tablet Take 1 tablet (20 mg total) by mouth every other day.     No facility-administered medications prior to visit.     Per HPI unless specifically indicated in ROS section below Review of Systems  Objective:  BP 100/60   Pulse 88   Temp 98.1 F (36.7 C) (Oral)   Ht 5' 10 (1.778 m)   Wt 198 lb 2 oz (89.9 kg)   SpO2 94%   BMI 28.43 kg/m   Wt Readings from Last 3 Encounters:  04/08/24 198 lb 2 oz (89.9 kg)  03/21/24 210 lb 8.6 oz (95.5 kg)  03/17/24 210 lb 8.6 oz (95.5 kg)      Physical Exam Vitals and nursing note reviewed.  Constitutional:      Appearance: Normal appearance. He is not ill-appearing.     Comments: Sitting in transport  chair Needs assistance to stand, unsteady on his feet  HENT:     Mouth/Throat:     Mouth: Mucous membranes are moist.     Pharynx: Oropharynx is clear. No oropharyngeal exudate or posterior oropharyngeal erythema.  Eyes:     Extraocular Movements: Extraocular movements intact.     Pupils: Pupils are equal, round, and reactive to light.  Cardiovascular:     Rate and Rhythm: Normal rate and regular rhythm.     Pulses: Normal pulses.     Heart sounds: Murmur (3/6 systolic throughout) heard.  Pulmonary:     Effort: Pulmonary effort is normal. No respiratory distress.     Breath sounds: Normal breath sounds. No wheezing, rhonchi or rales.  Musculoskeletal:  Right lower leg: No edema.     Left lower leg: No edema.  Skin:    General: Skin is warm and dry.     Findings: Bruising present. No rash.  Neurological:     Mental Status: He is alert.  Psychiatric:        Mood and Affect: Mood normal.        Behavior: Behavior normal.       Lab Results  Component Value Date   NA 137 03/23/2024   CL 104 03/23/2024   K 4.3 03/23/2024   CO2 26 03/23/2024   BUN 37 (H) 03/23/2024   CREATININE 0.91 03/23/2024   GFRNONAA >60 03/23/2024   CALCIUM  7.8 (L) 03/23/2024   PHOS 2.9 03/13/2024   ALBUMIN  1.6 (L) 03/08/2024   GLUCOSE 97 03/23/2024    Lab Results  Component Value Date   WBC 6.4 03/23/2024   HGB 8.4 (L) 03/24/2024   HCT 27.0 (L) 03/23/2024   MCV 86.3 03/23/2024   PLT 237 03/23/2024    Bedside TEE 03/12/2024: LVEF 55-60%, no LA appendage thrombus, mild MR, mild AR, aortic sclerosis without AS, no vegetation.   Assessment & Plan:   Problem List Items Addressed This Visit     FTT (failure to thrive) in adult - Primary   Recent return to home on Saturday.  Ongoing anorexia with dysgeusia and weight loss.  Encouraged continued protein intake, continue Ensure supplements.       Relevant Orders   CBC with Differential/Platelet   Comprehensive metabolic panel with GFR   PAF  (paroxysmal atrial fibrillation) (HCC)   Sounds regular. Eliquis  on hold since recurrent rectal bleed.       Relevant Medications   lisinopril  (ZESTRIL ) 2.5 MG tablet   metoprolol  tartrate (LOPRESSOR ) 25 MG tablet   Aortic valve stenosis   Sclerosis without stenosis on latest TEE 03/2024.       Relevant Medications   lisinopril  (ZESTRIL ) 2.5 MG tablet   metoprolol  tartrate (LOPRESSOR ) 25 MG tablet   General weakness   Worked with PT/OT at [facility].  Pending restart home health services with [facility].       Perihepatic abscess (HCC)   S/p IR aspiration followed by drain placement, removed 04/01/2024.  Appreciate ID care - completed augmentin  course.  Update labs today.       Malnutrition of moderate degree   See above. Continue to encourage PO intake.  He is already on mirtazapine  30mg  nightly.       Lower GI bleed   No further rectal bleeding since home, eliquis  has been stopped.  Will remain off this. Update CBC.      Acute blood loss anemia   Update CBC.         No orders of the defined types were placed in this encounter.   Orders Placed This Encounter  Procedures   CBC with Differential/Platelet   Comprehensive metabolic panel with GFR    Patient Instructions  Reach out to home health [facility] if they don't contact you over next few days.  Labs today  Continue current medicines.  Continue Ensure 2-3 cans/day.  Continue to encourage eating 3 times a day.   Follow up plan: Return if symptoms worsen or fail to improve.  Anton Blas, MD

## 2024-04-09 ENCOUNTER — Telehealth: Payer: Self-pay

## 2024-04-09 DIAGNOSIS — E440 Moderate protein-calorie malnutrition: Secondary | ICD-10-CM | POA: Diagnosis not present

## 2024-04-09 DIAGNOSIS — K650 Generalized (acute) peritonitis: Secondary | ICD-10-CM | POA: Diagnosis not present

## 2024-04-09 DIAGNOSIS — E1122 Type 2 diabetes mellitus with diabetic chronic kidney disease: Secondary | ICD-10-CM | POA: Diagnosis not present

## 2024-04-09 DIAGNOSIS — I130 Hypertensive heart and chronic kidney disease with heart failure and stage 1 through stage 4 chronic kidney disease, or unspecified chronic kidney disease: Secondary | ICD-10-CM | POA: Diagnosis not present

## 2024-04-09 DIAGNOSIS — D75839 Thrombocytosis, unspecified: Secondary | ICD-10-CM | POA: Diagnosis not present

## 2024-04-09 DIAGNOSIS — I480 Paroxysmal atrial fibrillation: Secondary | ICD-10-CM | POA: Diagnosis not present

## 2024-04-09 DIAGNOSIS — N1831 Chronic kidney disease, stage 3a: Secondary | ICD-10-CM | POA: Diagnosis not present

## 2024-04-09 DIAGNOSIS — K922 Gastrointestinal hemorrhage, unspecified: Secondary | ICD-10-CM | POA: Diagnosis not present

## 2024-04-09 DIAGNOSIS — I5032 Chronic diastolic (congestive) heart failure: Secondary | ICD-10-CM | POA: Diagnosis not present

## 2024-04-09 NOTE — Telephone Encounter (Signed)
 Copied from CRM [id number]. Topic: Clinical - Home Health Verbal Orders >> Apr 09, 2024  2:40 PM Franky GRADE wrote: Caller/Agency: [facility]  Callback Number: [phone] Service Requested: Skilled Nursing Frequency: 1 time a week for 3 weeks, then every other for 6 weeks.  Any new concerns about the patient? No

## 2024-04-09 NOTE — Telephone Encounter (Signed)
 Agree with this. Thanks.

## 2024-04-10 NOTE — Telephone Encounter (Signed)
 Called [facility] gave verbal ok per Dr. Rilla. Will call if any further questions.

## 2024-04-12 ENCOUNTER — Ambulatory Visit: Payer: Self-pay | Admitting: Family Medicine

## 2024-04-15 DIAGNOSIS — D75839 Thrombocytosis, unspecified: Secondary | ICD-10-CM | POA: Diagnosis not present

## 2024-04-15 DIAGNOSIS — I5032 Chronic diastolic (congestive) heart failure: Secondary | ICD-10-CM | POA: Diagnosis not present

## 2024-04-15 DIAGNOSIS — I480 Paroxysmal atrial fibrillation: Secondary | ICD-10-CM | POA: Diagnosis not present

## 2024-04-15 DIAGNOSIS — I130 Hypertensive heart and chronic kidney disease with heart failure and stage 1 through stage 4 chronic kidney disease, or unspecified chronic kidney disease: Secondary | ICD-10-CM | POA: Diagnosis not present

## 2024-04-15 DIAGNOSIS — N1831 Chronic kidney disease, stage 3a: Secondary | ICD-10-CM | POA: Diagnosis not present

## 2024-04-15 DIAGNOSIS — E440 Moderate protein-calorie malnutrition: Secondary | ICD-10-CM | POA: Diagnosis not present

## 2024-04-15 DIAGNOSIS — K922 Gastrointestinal hemorrhage, unspecified: Secondary | ICD-10-CM | POA: Diagnosis not present

## 2024-04-15 DIAGNOSIS — E1122 Type 2 diabetes mellitus with diabetic chronic kidney disease: Secondary | ICD-10-CM | POA: Diagnosis not present

## 2024-04-15 DIAGNOSIS — K650 Generalized (acute) peritonitis: Secondary | ICD-10-CM | POA: Diagnosis not present

## 2024-04-16 ENCOUNTER — Telehealth: Payer: Self-pay

## 2024-04-16 DIAGNOSIS — N1831 Chronic kidney disease, stage 3a: Secondary | ICD-10-CM | POA: Diagnosis not present

## 2024-04-16 DIAGNOSIS — I5032 Chronic diastolic (congestive) heart failure: Secondary | ICD-10-CM | POA: Diagnosis not present

## 2024-04-16 DIAGNOSIS — I130 Hypertensive heart and chronic kidney disease with heart failure and stage 1 through stage 4 chronic kidney disease, or unspecified chronic kidney disease: Secondary | ICD-10-CM | POA: Diagnosis not present

## 2024-04-16 DIAGNOSIS — K922 Gastrointestinal hemorrhage, unspecified: Secondary | ICD-10-CM | POA: Diagnosis not present

## 2024-04-16 DIAGNOSIS — I480 Paroxysmal atrial fibrillation: Secondary | ICD-10-CM | POA: Diagnosis not present

## 2024-04-16 DIAGNOSIS — E1122 Type 2 diabetes mellitus with diabetic chronic kidney disease: Secondary | ICD-10-CM | POA: Diagnosis not present

## 2024-04-16 DIAGNOSIS — K650 Generalized (acute) peritonitis: Secondary | ICD-10-CM | POA: Diagnosis not present

## 2024-04-16 DIAGNOSIS — D75839 Thrombocytosis, unspecified: Secondary | ICD-10-CM | POA: Diagnosis not present

## 2024-04-16 DIAGNOSIS — E440 Moderate protein-calorie malnutrition: Secondary | ICD-10-CM | POA: Diagnosis not present

## 2024-04-16 NOTE — Telephone Encounter (Signed)
 Agree with this. Thanks.

## 2024-04-16 NOTE — Telephone Encounter (Signed)
 Copied from CRM [id number]. Topic: Clinical - Home Health Verbal Orders >> Apr 16, 2024  1:54 PM Mia F wrote: Caller/Agency: Signe from [facility] Callback Number: [phone] Service Requested: Occupational Therapy Frequency: 1 time a week for 8 weeks Any new concerns about the patient? No

## 2024-04-17 ENCOUNTER — Telehealth: Payer: Self-pay

## 2024-04-17 NOTE — Telephone Encounter (Signed)
 Spoke with Gracee, of [facility], informing her Dr KANDICE is giving verbal orders for services requested for pt. She expresses her thanks.

## 2024-04-17 NOTE — Telephone Encounter (Signed)
 Spoke with Signe, of [facility], informing him Dr KANDICE is giving verbal orders requested for services for pt.

## 2024-04-17 NOTE — Telephone Encounter (Signed)
 Copied from CRM [id number]. Topic: Clinical - Home Health Verbal Orders >> Apr 17, 2024  9:34 AM Laymon HERO wrote: Caller/Agency: Gracee- [facility] Callback Number: [phone] Service Requested: Physical Therapy Frequency: 1x1; 2x4; 1x3  Any new concerns about the patient? No

## 2024-04-17 NOTE — Telephone Encounter (Signed)
 Agree with this. Thanks.

## 2024-04-18 ENCOUNTER — Ambulatory Visit: Admitting: Family Medicine

## 2024-04-21 ENCOUNTER — Ambulatory Visit (INDEPENDENT_AMBULATORY_CARE_PROVIDER_SITE_OTHER): Admitting: Podiatry

## 2024-04-21 DIAGNOSIS — I5032 Chronic diastolic (congestive) heart failure: Secondary | ICD-10-CM | POA: Diagnosis not present

## 2024-04-21 DIAGNOSIS — K650 Generalized (acute) peritonitis: Secondary | ICD-10-CM | POA: Diagnosis not present

## 2024-04-21 DIAGNOSIS — D75839 Thrombocytosis, unspecified: Secondary | ICD-10-CM | POA: Diagnosis not present

## 2024-04-21 DIAGNOSIS — E1122 Type 2 diabetes mellitus with diabetic chronic kidney disease: Secondary | ICD-10-CM | POA: Diagnosis not present

## 2024-04-21 DIAGNOSIS — I480 Paroxysmal atrial fibrillation: Secondary | ICD-10-CM | POA: Diagnosis not present

## 2024-04-21 DIAGNOSIS — K922 Gastrointestinal hemorrhage, unspecified: Secondary | ICD-10-CM | POA: Diagnosis not present

## 2024-04-21 DIAGNOSIS — I130 Hypertensive heart and chronic kidney disease with heart failure and stage 1 through stage 4 chronic kidney disease, or unspecified chronic kidney disease: Secondary | ICD-10-CM | POA: Diagnosis not present

## 2024-04-21 DIAGNOSIS — N1831 Chronic kidney disease, stage 3a: Secondary | ICD-10-CM | POA: Diagnosis not present

## 2024-04-21 DIAGNOSIS — E440 Moderate protein-calorie malnutrition: Secondary | ICD-10-CM | POA: Diagnosis not present

## 2024-04-21 DIAGNOSIS — Z91198 Patient's noncompliance with other medical treatment and regimen for other reason: Secondary | ICD-10-CM

## 2024-04-21 NOTE — Progress Notes (Signed)
 1. Failure to attend appointment with reason given    Patient rescheduled.

## 2024-04-22 DIAGNOSIS — K650 Generalized (acute) peritonitis: Secondary | ICD-10-CM | POA: Diagnosis not present

## 2024-04-22 DIAGNOSIS — E1122 Type 2 diabetes mellitus with diabetic chronic kidney disease: Secondary | ICD-10-CM | POA: Diagnosis not present

## 2024-04-22 DIAGNOSIS — K922 Gastrointestinal hemorrhage, unspecified: Secondary | ICD-10-CM | POA: Diagnosis not present

## 2024-04-22 DIAGNOSIS — I5032 Chronic diastolic (congestive) heart failure: Secondary | ICD-10-CM | POA: Diagnosis not present

## 2024-04-22 DIAGNOSIS — I130 Hypertensive heart and chronic kidney disease with heart failure and stage 1 through stage 4 chronic kidney disease, or unspecified chronic kidney disease: Secondary | ICD-10-CM | POA: Diagnosis not present

## 2024-04-22 DIAGNOSIS — N1831 Chronic kidney disease, stage 3a: Secondary | ICD-10-CM | POA: Diagnosis not present

## 2024-04-22 DIAGNOSIS — I480 Paroxysmal atrial fibrillation: Secondary | ICD-10-CM | POA: Diagnosis not present

## 2024-04-22 DIAGNOSIS — D75839 Thrombocytosis, unspecified: Secondary | ICD-10-CM | POA: Diagnosis not present

## 2024-04-22 DIAGNOSIS — E440 Moderate protein-calorie malnutrition: Secondary | ICD-10-CM | POA: Diagnosis not present

## 2024-04-23 DIAGNOSIS — E1122 Type 2 diabetes mellitus with diabetic chronic kidney disease: Secondary | ICD-10-CM | POA: Diagnosis not present

## 2024-04-23 DIAGNOSIS — I130 Hypertensive heart and chronic kidney disease with heart failure and stage 1 through stage 4 chronic kidney disease, or unspecified chronic kidney disease: Secondary | ICD-10-CM | POA: Diagnosis not present

## 2024-04-23 DIAGNOSIS — K650 Generalized (acute) peritonitis: Secondary | ICD-10-CM | POA: Diagnosis not present

## 2024-04-23 DIAGNOSIS — I5032 Chronic diastolic (congestive) heart failure: Secondary | ICD-10-CM | POA: Diagnosis not present

## 2024-04-23 DIAGNOSIS — K922 Gastrointestinal hemorrhage, unspecified: Secondary | ICD-10-CM | POA: Diagnosis not present

## 2024-04-23 DIAGNOSIS — E440 Moderate protein-calorie malnutrition: Secondary | ICD-10-CM | POA: Diagnosis not present

## 2024-04-23 DIAGNOSIS — N1831 Chronic kidney disease, stage 3a: Secondary | ICD-10-CM | POA: Diagnosis not present

## 2024-04-23 DIAGNOSIS — I480 Paroxysmal atrial fibrillation: Secondary | ICD-10-CM | POA: Diagnosis not present

## 2024-04-23 DIAGNOSIS — D75839 Thrombocytosis, unspecified: Secondary | ICD-10-CM | POA: Diagnosis not present

## 2024-04-24 DIAGNOSIS — E1122 Type 2 diabetes mellitus with diabetic chronic kidney disease: Secondary | ICD-10-CM | POA: Diagnosis not present

## 2024-04-24 DIAGNOSIS — K922 Gastrointestinal hemorrhage, unspecified: Secondary | ICD-10-CM | POA: Diagnosis not present

## 2024-04-24 DIAGNOSIS — N1831 Chronic kidney disease, stage 3a: Secondary | ICD-10-CM | POA: Diagnosis not present

## 2024-04-24 DIAGNOSIS — I130 Hypertensive heart and chronic kidney disease with heart failure and stage 1 through stage 4 chronic kidney disease, or unspecified chronic kidney disease: Secondary | ICD-10-CM | POA: Diagnosis not present

## 2024-04-24 DIAGNOSIS — I5032 Chronic diastolic (congestive) heart failure: Secondary | ICD-10-CM | POA: Diagnosis not present

## 2024-04-24 DIAGNOSIS — E440 Moderate protein-calorie malnutrition: Secondary | ICD-10-CM | POA: Diagnosis not present

## 2024-04-24 DIAGNOSIS — I480 Paroxysmal atrial fibrillation: Secondary | ICD-10-CM | POA: Diagnosis not present

## 2024-04-24 DIAGNOSIS — K650 Generalized (acute) peritonitis: Secondary | ICD-10-CM | POA: Diagnosis not present

## 2024-04-24 DIAGNOSIS — D75839 Thrombocytosis, unspecified: Secondary | ICD-10-CM | POA: Diagnosis not present

## 2024-04-28 DIAGNOSIS — I5032 Chronic diastolic (congestive) heart failure: Secondary | ICD-10-CM | POA: Diagnosis not present

## 2024-04-28 DIAGNOSIS — N1831 Chronic kidney disease, stage 3a: Secondary | ICD-10-CM | POA: Diagnosis not present

## 2024-04-28 DIAGNOSIS — E440 Moderate protein-calorie malnutrition: Secondary | ICD-10-CM | POA: Diagnosis not present

## 2024-04-28 DIAGNOSIS — K922 Gastrointestinal hemorrhage, unspecified: Secondary | ICD-10-CM | POA: Diagnosis not present

## 2024-04-28 DIAGNOSIS — I130 Hypertensive heart and chronic kidney disease with heart failure and stage 1 through stage 4 chronic kidney disease, or unspecified chronic kidney disease: Secondary | ICD-10-CM | POA: Diagnosis not present

## 2024-04-28 DIAGNOSIS — I480 Paroxysmal atrial fibrillation: Secondary | ICD-10-CM | POA: Diagnosis not present

## 2024-04-28 DIAGNOSIS — K650 Generalized (acute) peritonitis: Secondary | ICD-10-CM | POA: Diagnosis not present

## 2024-04-28 DIAGNOSIS — E1122 Type 2 diabetes mellitus with diabetic chronic kidney disease: Secondary | ICD-10-CM | POA: Diagnosis not present

## 2024-04-28 DIAGNOSIS — D75839 Thrombocytosis, unspecified: Secondary | ICD-10-CM | POA: Diagnosis not present

## 2024-04-29 DIAGNOSIS — K650 Generalized (acute) peritonitis: Secondary | ICD-10-CM | POA: Diagnosis not present

## 2024-04-29 DIAGNOSIS — E440 Moderate protein-calorie malnutrition: Secondary | ICD-10-CM | POA: Diagnosis not present

## 2024-04-29 DIAGNOSIS — I5032 Chronic diastolic (congestive) heart failure: Secondary | ICD-10-CM | POA: Diagnosis not present

## 2024-04-29 DIAGNOSIS — I130 Hypertensive heart and chronic kidney disease with heart failure and stage 1 through stage 4 chronic kidney disease, or unspecified chronic kidney disease: Secondary | ICD-10-CM | POA: Diagnosis not present

## 2024-04-29 DIAGNOSIS — D75839 Thrombocytosis, unspecified: Secondary | ICD-10-CM | POA: Diagnosis not present

## 2024-04-29 DIAGNOSIS — K922 Gastrointestinal hemorrhage, unspecified: Secondary | ICD-10-CM | POA: Diagnosis not present

## 2024-04-29 DIAGNOSIS — N1831 Chronic kidney disease, stage 3a: Secondary | ICD-10-CM | POA: Diagnosis not present

## 2024-04-29 DIAGNOSIS — I480 Paroxysmal atrial fibrillation: Secondary | ICD-10-CM | POA: Diagnosis not present

## 2024-04-29 DIAGNOSIS — E1122 Type 2 diabetes mellitus with diabetic chronic kidney disease: Secondary | ICD-10-CM | POA: Diagnosis not present

## 2024-05-01 DIAGNOSIS — K922 Gastrointestinal hemorrhage, unspecified: Secondary | ICD-10-CM | POA: Diagnosis not present

## 2024-05-01 DIAGNOSIS — I5032 Chronic diastolic (congestive) heart failure: Secondary | ICD-10-CM | POA: Diagnosis not present

## 2024-05-01 DIAGNOSIS — I480 Paroxysmal atrial fibrillation: Secondary | ICD-10-CM | POA: Diagnosis not present

## 2024-05-01 DIAGNOSIS — E1122 Type 2 diabetes mellitus with diabetic chronic kidney disease: Secondary | ICD-10-CM | POA: Diagnosis not present

## 2024-05-01 DIAGNOSIS — N1831 Chronic kidney disease, stage 3a: Secondary | ICD-10-CM | POA: Diagnosis not present

## 2024-05-01 DIAGNOSIS — I130 Hypertensive heart and chronic kidney disease with heart failure and stage 1 through stage 4 chronic kidney disease, or unspecified chronic kidney disease: Secondary | ICD-10-CM | POA: Diagnosis not present

## 2024-05-01 DIAGNOSIS — K650 Generalized (acute) peritonitis: Secondary | ICD-10-CM | POA: Diagnosis not present

## 2024-05-01 DIAGNOSIS — D75839 Thrombocytosis, unspecified: Secondary | ICD-10-CM | POA: Diagnosis not present

## 2024-05-01 DIAGNOSIS — E440 Moderate protein-calorie malnutrition: Secondary | ICD-10-CM | POA: Diagnosis not present

## 2024-05-05 DIAGNOSIS — I130 Hypertensive heart and chronic kidney disease with heart failure and stage 1 through stage 4 chronic kidney disease, or unspecified chronic kidney disease: Secondary | ICD-10-CM | POA: Diagnosis not present

## 2024-05-05 DIAGNOSIS — N39498 Other specified urinary incontinence: Secondary | ICD-10-CM

## 2024-05-05 DIAGNOSIS — D75839 Thrombocytosis, unspecified: Secondary | ICD-10-CM | POA: Diagnosis not present

## 2024-05-05 DIAGNOSIS — K922 Gastrointestinal hemorrhage, unspecified: Secondary | ICD-10-CM | POA: Diagnosis not present

## 2024-05-05 DIAGNOSIS — N401 Enlarged prostate with lower urinary tract symptoms: Secondary | ICD-10-CM

## 2024-05-05 DIAGNOSIS — I5032 Chronic diastolic (congestive) heart failure: Secondary | ICD-10-CM | POA: Diagnosis not present

## 2024-05-05 DIAGNOSIS — E440 Moderate protein-calorie malnutrition: Secondary | ICD-10-CM | POA: Diagnosis not present

## 2024-05-05 DIAGNOSIS — N1831 Chronic kidney disease, stage 3a: Secondary | ICD-10-CM | POA: Diagnosis not present

## 2024-05-05 DIAGNOSIS — I480 Paroxysmal atrial fibrillation: Secondary | ICD-10-CM | POA: Diagnosis not present

## 2024-05-05 DIAGNOSIS — M1990 Unspecified osteoarthritis, unspecified site: Secondary | ICD-10-CM

## 2024-05-05 DIAGNOSIS — E1122 Type 2 diabetes mellitus with diabetic chronic kidney disease: Secondary | ICD-10-CM | POA: Diagnosis not present

## 2024-05-05 DIAGNOSIS — K650 Generalized (acute) peritonitis: Secondary | ICD-10-CM | POA: Diagnosis not present

## 2024-05-06 DIAGNOSIS — K650 Generalized (acute) peritonitis: Secondary | ICD-10-CM | POA: Diagnosis not present

## 2024-05-06 DIAGNOSIS — N1831 Chronic kidney disease, stage 3a: Secondary | ICD-10-CM | POA: Diagnosis not present

## 2024-05-06 DIAGNOSIS — I130 Hypertensive heart and chronic kidney disease with heart failure and stage 1 through stage 4 chronic kidney disease, or unspecified chronic kidney disease: Secondary | ICD-10-CM | POA: Diagnosis not present

## 2024-05-06 DIAGNOSIS — K922 Gastrointestinal hemorrhage, unspecified: Secondary | ICD-10-CM | POA: Diagnosis not present

## 2024-05-06 DIAGNOSIS — I480 Paroxysmal atrial fibrillation: Secondary | ICD-10-CM | POA: Diagnosis not present

## 2024-05-06 DIAGNOSIS — E1122 Type 2 diabetes mellitus with diabetic chronic kidney disease: Secondary | ICD-10-CM | POA: Diagnosis not present

## 2024-05-06 DIAGNOSIS — D75839 Thrombocytosis, unspecified: Secondary | ICD-10-CM | POA: Diagnosis not present

## 2024-05-06 DIAGNOSIS — I5032 Chronic diastolic (congestive) heart failure: Secondary | ICD-10-CM | POA: Diagnosis not present

## 2024-05-06 DIAGNOSIS — E440 Moderate protein-calorie malnutrition: Secondary | ICD-10-CM | POA: Diagnosis not present

## 2024-05-07 ENCOUNTER — Encounter: Payer: Self-pay | Admitting: Internal Medicine

## 2024-05-07 ENCOUNTER — Ambulatory Visit: Attending: Internal Medicine | Admitting: Internal Medicine

## 2024-05-07 VITALS — BP 100/50 | HR 75 | Ht 70.0 in | Wt 210.8 lb

## 2024-05-07 DIAGNOSIS — I5032 Chronic diastolic (congestive) heart failure: Secondary | ICD-10-CM

## 2024-05-07 DIAGNOSIS — I480 Paroxysmal atrial fibrillation: Secondary | ICD-10-CM | POA: Diagnosis not present

## 2024-05-07 DIAGNOSIS — E1169 Type 2 diabetes mellitus with other specified complication: Secondary | ICD-10-CM | POA: Diagnosis not present

## 2024-05-07 DIAGNOSIS — I493 Ventricular premature depolarization: Secondary | ICD-10-CM

## 2024-05-07 DIAGNOSIS — I2510 Atherosclerotic heart disease of native coronary artery without angina pectoris: Secondary | ICD-10-CM

## 2024-05-07 DIAGNOSIS — E785 Hyperlipidemia, unspecified: Secondary | ICD-10-CM

## 2024-05-07 NOTE — Progress Notes (Unsigned)
 Cardiology Office Note:  .   Date:  05/09/2024  ID:  [name] Young., DOB 1941/03/13, MRN [id number] PCP: Rilla Baller, MD  [facility] Providers Cardiologist:  Lonni Hanson, MD     History of Present Illness: .   [name] Young. is a 83 y.o. male with history of three-vessel CAD status post CABG (01/2018) complicated by postoperative atrial fibrillation (transient atrial fibrillation recurred in the setting of sepsis related to ascending cholangitis), ascending cholangitis complicated by recurrent atrial fibrillation, heart failure with recovered ejection fraction (LVEF 40-45% -> 50-55%), and mitral valve endocarditis, as well as hypertension, hyperlipidemia, type 2 diabetes mellitus, hepatic abscess, and chronic kidney disease stage III, who presents for follow-up of CAD, HFpEF, and PAF.  I last saw him in late April, at which time he was feeling a bit better.  We elected to move forward with right and left heart catheterization that was previously deferred in the setting of large right hemothorax.  Cath showed severe native CAD with widely patent bypass grafts and normal left and right heart filling pressures.  He was hospitalized again in June with recurrent perihepatic abscess and bacteremia and subsequent acute blood loss anemia from diverticular bleed.  Decision was made to discontinue apixaban .  Today, [name] Young reports that he is feeling a little better with gradually improving energy and exertional dyspnea.  He denies chest pain, palpitations, and edema.  He is concerned about his low blood pressure at times, which is sometimes associated with lightheadedness.  He has not passed out or fallen.  Perihepatic abscess drain has been removed.  He has not noticed any bleeding but remains or apixaban .  ROS: See HPI  Studies Reviewed: SABRA   EKG Interpretation Date/Time:  Wednesday May 07 2024 13:55:34 EDT Ventricular Rate:  75 PR Interval:  156 QRS Duration:  84 QT  Interval:  378 QTC Calculation: 422 R Axis:   7  Text Interpretation: Sinus rhythm with frequent and consecutive Premature ventricular complexes Abnormal ECG When compared with ECG of 22-Mar-2024 02:22, Premature ventricular complexes are more frequent HEART RATE has decreased Confirmed by Symphony Demuro [phone]) on 05/09/2024 2:31:01 PM    Risk Assessment/Calculations:    CHA2DS2-VASc Score = 6   This indicates a 9.7% annual risk of stroke. The patient's score is based upon: CHF History: 1 HTN History: 1 Diabetes History: 1 Stroke History: 0 Vascular Disease History: 1 Age Score: 2 Gender Score: 0        Physical Exam:   VS:  BP (!) 100/50 (BP Location: Left Arm, Patient Position: Sitting, Cuff Size: Large)   Pulse 75   Ht 5' 10 (1.778 m)   Wt 210 lb 12.8 oz (95.6 kg)   SpO2 98%   BMI 30.25 kg/m    Wt Readings from Last 3 Encounters:  05/07/24 210 lb 12.8 oz (95.6 kg)  04/08/24 198 lb 2 oz (89.9 kg)  03/21/24 210 lb 8.6 oz (95.5 kg)    General:  NAD. Neck: No JVD or HJR. Lungs: Clear to auscultation bilaterally without wheezes or crackles. Heart: Regular rate and rhythm with frequent extrasystoles. Abdomen: Soft, nontender, nondistended. Extremities: No lower extremity edema.  ASSESSMENT AND PLAN: .    CAD: No angina reported.  Catheterization in May reassuring with severe native CAD but 3/3 patent bypass grafts.  Continue aspirin , rosuvastatin , and metoprolol  for secondary prevention.  Heart failure with recovered ejection fraction: Mr. Denker appears euvolemic on exam and notes gradual improvement in  his chronic DOE that is likely multifactorial (HF, anemia, and deconditioning).  L/RHC in May showed normal left and right heart filling pressures and normal cardiac output.  Findings suggest that recurrent right pleural effusion was NOT primarily due to heart failure.  No evidence of recurrent pleural effusion following drainage of perihepatic abscess.  Given soft BP  with lightheadedness and hyperkalemia noted during last hospitalization, we have agreed to stop spironolactone .  I will recheck a BMP today.  If BP remains soft, we may need to stop lisinopril  in the future.  Paroxysmal atrial fibrillation: No evidence of a-fib today or on recent event monitor.  Given recent acute blood loss anemia from suspected diverticular bleed, we will defer resumption of apixaban .  We also discussed role for left atrial appendage occlusion.  However, in the setting of recurrent bacteremia, I would be very reluctant to implant an intracardiac device at this time.  If he demonstrates durable freedom from bacteremia, EP consultation could be considered.  Frequent PVC's: Noted on today's EKG and recent event monitor (PVC burden ~30%).  Given normalization of LVEF and lack of symptoms, we will defer antiarrhythmic therapy and EP consultation for now.  Continue low-dose metoprolol .  Hyperlipidemia associated with type 2 diabetes mellitus: LDL well-controlled on last check in 06/2023.  Continue rosuvastatin  10 mg daily.    Dispo: Return to clinic in 3 months.  Signed, Lonni Hanson, MD

## 2024-05-07 NOTE — Patient Instructions (Signed)
 Medication Instructions:  Your physician recommends the following medication changes.  STOP TAKING: Spironolactone    *If you need a refill on your cardiac medications before your next appointment, please call your pharmacy*  Lab Work: Your provider would like for you to have following labs drawn today BMP, CBC.     Testing/Procedures: No test ordered today   Follow-Up: At [facility], you and your health needs are our priority.  As part of our continuing mission to provide you with exceptional heart care, our providers are all part of one team.  This team includes your primary Cardiologist (physician) and Advanced Practice Providers or APPs (Physician Assistants and Nurse Practitioners) who all work together to provide you with the care you need, when you need it.  Your next appointment:   3 month(s)  Provider:   You may see Lonni Hanson, MD or one of the following Advanced Practice Providers on your designated Care Team:   Lonni Meager, NP Lesley Maffucci, PA-C Bernardino Bring, PA-C Cadence [place], PA-C Tylene Lunch, NP Barnie Hila, NP

## 2024-05-08 DIAGNOSIS — I130 Hypertensive heart and chronic kidney disease with heart failure and stage 1 through stage 4 chronic kidney disease, or unspecified chronic kidney disease: Secondary | ICD-10-CM | POA: Diagnosis not present

## 2024-05-08 DIAGNOSIS — E1122 Type 2 diabetes mellitus with diabetic chronic kidney disease: Secondary | ICD-10-CM | POA: Diagnosis not present

## 2024-05-08 DIAGNOSIS — D75839 Thrombocytosis, unspecified: Secondary | ICD-10-CM | POA: Diagnosis not present

## 2024-05-08 DIAGNOSIS — I5032 Chronic diastolic (congestive) heart failure: Secondary | ICD-10-CM | POA: Diagnosis not present

## 2024-05-08 DIAGNOSIS — N1831 Chronic kidney disease, stage 3a: Secondary | ICD-10-CM | POA: Diagnosis not present

## 2024-05-08 DIAGNOSIS — E440 Moderate protein-calorie malnutrition: Secondary | ICD-10-CM | POA: Diagnosis not present

## 2024-05-08 DIAGNOSIS — M6281 Muscle weakness (generalized): Secondary | ICD-10-CM | POA: Diagnosis not present

## 2024-05-08 DIAGNOSIS — I480 Paroxysmal atrial fibrillation: Secondary | ICD-10-CM | POA: Diagnosis not present

## 2024-05-08 DIAGNOSIS — K650 Generalized (acute) peritonitis: Secondary | ICD-10-CM | POA: Diagnosis not present

## 2024-05-08 DIAGNOSIS — K922 Gastrointestinal hemorrhage, unspecified: Secondary | ICD-10-CM | POA: Diagnosis not present

## 2024-05-08 LAB — CBC
Hematocrit: 33.3 % — ABNORMAL LOW (ref 37.5–51.0)
Hemoglobin: 10.6 g/dL — ABNORMAL LOW (ref 13.0–17.7)
MCH: 29.4 pg (ref 26.6–33.0)
MCHC: 31.8 g/dL (ref 31.5–35.7)
MCV: 92 fL (ref 79–97)
Platelets: 211 x10E3/uL (ref 150–450)
RBC: 3.61 x10E6/uL — ABNORMAL LOW (ref 4.14–5.80)
RDW: 15.8 % — ABNORMAL HIGH (ref 11.6–15.4)
WBC: 7.2 x10E3/uL (ref 3.4–10.8)

## 2024-05-08 LAB — BASIC METABOLIC PANEL WITH GFR
BUN/Creatinine Ratio: 36 — ABNORMAL HIGH (ref 10–24)
BUN: 43 mg/dL — ABNORMAL HIGH (ref 8–27)
CO2: 21 mmol/L (ref 20–29)
Calcium: 9 mg/dL (ref 8.6–10.2)
Chloride: 102 mmol/L (ref 96–106)
Creatinine, Ser: 1.2 mg/dL (ref 0.76–1.27)
Glucose: 94 mg/dL (ref 70–99)
Potassium: 5.1 mmol/L (ref 3.5–5.2)
Sodium: 142 mmol/L (ref 134–144)
eGFR: 61 mL/min/1.73 (ref >59)

## 2024-05-09 ENCOUNTER — Ambulatory Visit: Payer: Self-pay | Admitting: Internal Medicine

## 2024-05-09 ENCOUNTER — Encounter: Payer: Self-pay | Admitting: Internal Medicine

## 2024-05-09 DIAGNOSIS — N1831 Chronic kidney disease, stage 3a: Secondary | ICD-10-CM | POA: Diagnosis not present

## 2024-05-09 DIAGNOSIS — E440 Moderate protein-calorie malnutrition: Secondary | ICD-10-CM | POA: Diagnosis not present

## 2024-05-09 DIAGNOSIS — I480 Paroxysmal atrial fibrillation: Secondary | ICD-10-CM | POA: Diagnosis not present

## 2024-05-09 DIAGNOSIS — K650 Generalized (acute) peritonitis: Secondary | ICD-10-CM | POA: Diagnosis not present

## 2024-05-09 DIAGNOSIS — I5032 Chronic diastolic (congestive) heart failure: Secondary | ICD-10-CM | POA: Diagnosis not present

## 2024-05-09 DIAGNOSIS — D75839 Thrombocytosis, unspecified: Secondary | ICD-10-CM | POA: Diagnosis not present

## 2024-05-09 DIAGNOSIS — I130 Hypertensive heart and chronic kidney disease with heart failure and stage 1 through stage 4 chronic kidney disease, or unspecified chronic kidney disease: Secondary | ICD-10-CM | POA: Diagnosis not present

## 2024-05-09 DIAGNOSIS — K922 Gastrointestinal hemorrhage, unspecified: Secondary | ICD-10-CM | POA: Diagnosis not present

## 2024-05-09 DIAGNOSIS — E1122 Type 2 diabetes mellitus with diabetic chronic kidney disease: Secondary | ICD-10-CM | POA: Diagnosis not present

## 2024-05-13 DIAGNOSIS — D75839 Thrombocytosis, unspecified: Secondary | ICD-10-CM | POA: Diagnosis not present

## 2024-05-13 DIAGNOSIS — I5032 Chronic diastolic (congestive) heart failure: Secondary | ICD-10-CM | POA: Diagnosis not present

## 2024-05-13 DIAGNOSIS — K650 Generalized (acute) peritonitis: Secondary | ICD-10-CM | POA: Diagnosis not present

## 2024-05-13 DIAGNOSIS — I130 Hypertensive heart and chronic kidney disease with heart failure and stage 1 through stage 4 chronic kidney disease, or unspecified chronic kidney disease: Secondary | ICD-10-CM | POA: Diagnosis not present

## 2024-05-13 DIAGNOSIS — I480 Paroxysmal atrial fibrillation: Secondary | ICD-10-CM | POA: Diagnosis not present

## 2024-05-13 DIAGNOSIS — N1831 Chronic kidney disease, stage 3a: Secondary | ICD-10-CM | POA: Diagnosis not present

## 2024-05-13 DIAGNOSIS — K922 Gastrointestinal hemorrhage, unspecified: Secondary | ICD-10-CM | POA: Diagnosis not present

## 2024-05-13 DIAGNOSIS — E440 Moderate protein-calorie malnutrition: Secondary | ICD-10-CM | POA: Diagnosis not present

## 2024-05-13 DIAGNOSIS — E1122 Type 2 diabetes mellitus with diabetic chronic kidney disease: Secondary | ICD-10-CM | POA: Diagnosis not present

## 2024-05-14 ENCOUNTER — Encounter: Payer: Self-pay | Admitting: Family Medicine

## 2024-05-14 ENCOUNTER — Ambulatory Visit: Admitting: Family Medicine

## 2024-05-14 VITALS — BP 98/60 | HR 60 | Temp 97.6°F | Wt 209.1 lb

## 2024-05-14 DIAGNOSIS — I959 Hypotension, unspecified: Secondary | ICD-10-CM

## 2024-05-14 DIAGNOSIS — Z6830 Body mass index (BMI) 30.0-30.9, adult: Secondary | ICD-10-CM

## 2024-05-14 DIAGNOSIS — I5032 Chronic diastolic (congestive) heart failure: Secondary | ICD-10-CM | POA: Diagnosis not present

## 2024-05-14 DIAGNOSIS — R109 Unspecified abdominal pain: Secondary | ICD-10-CM

## 2024-05-14 DIAGNOSIS — R001 Bradycardia, unspecified: Secondary | ICD-10-CM

## 2024-05-14 DIAGNOSIS — I480 Paroxysmal atrial fibrillation: Secondary | ICD-10-CM

## 2024-05-14 DIAGNOSIS — R627 Adult failure to thrive: Secondary | ICD-10-CM

## 2024-05-14 DIAGNOSIS — L89311 Pressure ulcer of right buttock, stage 1: Secondary | ICD-10-CM

## 2024-05-14 MED ORDER — CEPHALEXIN 500 MG PO CAPS: 500.0000 mg | ORAL_CAPSULE | Freq: Three times a day (TID) | ORAL | 0 refills | Status: DC

## 2024-05-14 NOTE — Patient Instructions (Addendum)
 For tender swelling to right flank - take antibiotic sent to pharmacy - possible cellulitis developing Keflex  500mg  three times daily for 7 days.  Go ahead and stop lisinopril   Continue regular use of desitin or boudreaux butt cream to buttock area, let us  know if skin breakdown develops.  Return to see me in 6-8 weeks for follow up visit.

## 2024-05-14 NOTE — Progress Notes (Unsigned)
 Ph: [phone] Fax: [phone]   Patient ID: [name] LULLA Bernardo Mickey., male    DOB: [birth year], 83 y.o.   MRN: [id number]  This visit was conducted in person.  BP 98/60 (BP Location: Right Arm, Cuff Size: Normal)   Pulse 60   Temp 97.6 F (36.4 C) (Oral)   Wt 209 lb 2 oz (94.9 kg)   SpO2 98%   BMI 30.01 kg/m   BP Readings from Last 3 Encounters:  05/14/24 98/60  05/07/24 (!) 100/50  04/08/24 100/60   CC: 1 mo f/u visit  Subjective:   HPI: [name] Grunewald. is a 83 y.o. male presenting on 05/14/2024 for Medical Management of Chronic Issues (1 mth F/u FTT/Patient accompanied by his wife [name])   See prior note for details.  Recent hospitalization x2 for recurrent perihepatic abscess, initially treated with IV abx.  Hospital course complicated by AKI, enterococcus faecalis bacteremia, hypotension and acute blood loss anemia s/p 3u pRBC transfusion. TEE 03/12/2024 without signs of endocarditis.   Subsequent hospitalization from SNF for rectal bleeding, again s/p 2u pRBC. Eliquis  discontinued. CT angio 03/22/2024 did not identify source of bleed.   Home from [facility] since 04/05/2024.  [facility] - continues receiving PT/OT/RN. Down to 1 day a week.   Remains off eliquis . Oral iron stopped due to constipation.   Saw cardiology Dr End last week, note reviewed.  Spironolactone  was stopped.  To consider stopping lisinopril   Metoprolol  12.5mg  bid continued.  Continues torsemide  20mg  every other day.   Continues mirtazapine  30mg  nightly - notes mild improvement to appetite. 11 lb weight gain since last seen.  No abd pain but notes tightness to R flank. No dizziness, lightheadedness, chest pain or dyspnea.   Requests refill of compounded Gerhardt's Butt Cream - equal parts hydrocortisone  1%, nystatin  cream, and zinc  oxide 20% ointment (60g jar). Rec start with OTC Boudreaux butt paste      Relevant past medical, surgical, family and social history reviewed and  updated as indicated. Interim medical history since our last visit reviewed. Allergies and medications reviewed and updated. Outpatient Medications Prior to Visit  Medication Sig Dispense Refill   acidophilus (RISAQUAD) CAPS capsule Take 1 capsule by mouth 2 (two) times daily.     aspirin  EC 81 MG tablet Take 81 mg by mouth daily. Swallow whole.     bisacodyl  (DULCOLAX) 5 MG EC tablet Take 10 mg by mouth at bedtime.     Cholecalciferol  (VITAMIN D3) 25 MCG (1000 UT) CAPS Take 1 capsule (1,000 Units total) by mouth daily. 30 capsule    Cyanocobalamin  (B-12) 1000 MCG CAPS Take 1 capsule by mouth every Monday, Wednesday, and Friday.     feeding supplement (ENSURE ENLIVE / ENSURE PLUS) LIQD Take 237 mLs by mouth 2 (two) times daily between meals. (Patient taking differently: Take 237 mLs by mouth daily.) 14220 mL 0   glucose blood (TRUE METRIX BLOOD GLUCOSE TEST) test strip Use as instructed to check blood sugar once a day 100 each 3   Magnesium  Oxide -Mg Supplement 250 MG TABS Take 1 tablet by mouth daily.     metoprolol  tartrate (LOPRESSOR ) 25 MG tablet Take 0.5 tablets (12.5 mg total) by mouth 2 (two) times daily.     mirtazapine  (REMERON ) 30 MG tablet Take 1 tablet (30 mg total) by mouth at bedtime. 90 tablet 1   Multiple Vitamins-Minerals (PRESERVISION AREDS 2 PO) Take 1 capsule by mouth daily.     Nystatin  (GERHARDT'S BUTT  CREAM) CREA Apply 1 Application topically 2 (two) times daily. 1 each 0   polyethylene glycol powder (GLYCOLAX /MIRALAX ) 17 GM/SCOOP powder Take 8.5-17 g by mouth daily as needed for moderate constipation.     rosuvastatin  (CRESTOR ) 10 MG tablet TAKE 1 TABLET BY MOUTH EVERY DAY 90 tablet 2   tamsulosin  (FLOMAX ) 0.4 MG CAPS capsule Take 1 capsule (0.4 mg total) by mouth daily. 90 capsule 4   torsemide  (DEMADEX ) 20 MG tablet Take 1 tablet (20 mg total) by mouth every other day.     ferrous sulfate  325 (65 FE) MG EC tablet Take 1 tablet (325 mg total) by mouth daily. 90 tablet 1    lisinopril  (ZESTRIL ) 2.5 MG tablet Take 1 tablet (2.5 mg total) by mouth daily.     amoxicillin -clavulanate (AUGMENTIN ) 875-125 MG tablet Take 1 tablet by mouth 2 (two) times daily. (Patient not taking: Reported on 05/14/2024)     No facility-administered medications prior to visit.     Per HPI unless specifically indicated in ROS section below Review of Systems  Objective:  BP 98/60 (BP Location: Right Arm, Cuff Size: Normal)   Pulse 60   Temp 97.6 F (36.4 C) (Oral)   Wt 209 lb 2 oz (94.9 kg)   SpO2 98%   BMI 30.01 kg/m   Wt Readings from Last 3 Encounters:  05/14/24 209 lb 2 oz (94.9 kg)  05/07/24 210 lb 12.8 oz (95.6 kg)  04/08/24 198 lb 2 oz (89.9 kg)      Physical Exam Vitals and nursing note reviewed.  Constitutional:      Appearance: Normal appearance. He is ill-appearing (tired appearing, with decreased stamina).     Comments: Ambulates with cane  HENT:     Mouth/Throat:     Mouth: Mucous membranes are moist.     Pharynx: Oropharynx is clear. No oropharyngeal exudate or posterior oropharyngeal erythema.  Eyes:     Extraocular Movements: Extraocular movements intact.     Pupils: Pupils are equal, round, and reactive to light.  Cardiovascular:     Rate and Rhythm: Bradycardia present. Rhythm irregular.     Pulses: Normal pulses.     Heart sounds: No murmur heard. Pulmonary:     Effort: Pulmonary effort is normal. No respiratory distress.     Breath sounds: No wheezing, rhonchi or rales.     Comments: Slightly diminished lung sounds to RLL Musculoskeletal:     Right lower leg: No edema.     Left lower leg: No edema.  Skin:    General: Skin is warm and dry.     Findings: Erythema present.         Comments: Slight erythema, swelling and induration without fluctuance to R flank  Neurological:     Mental Status: He is alert.  Psychiatric:        Mood and Affect: Mood normal.        Behavior: Behavior normal.       Results for orders placed or performed  in visit on 05/07/24  Basic metabolic panel with GFR   Collection Time: 05/07/24  2:20 PM  Result Value Ref Range   Glucose 94 70 - 99 mg/dL   BUN 43 (H) 8 - 27 mg/dL   Creatinine, Ser 8.79 0.76 - 1.27 mg/dL   eGFR 61 >40 fO/fpw/8.26   BUN/Creatinine Ratio 36 (H) 10 - 24   Sodium 142 134 - 144 mmol/L   Potassium 5.1 3.5 - 5.2 mmol/L   Chloride 102  96 - 106 mmol/L   CO2 21 20 - 29 mmol/L   Calcium  9.0 8.6 - 10.2 mg/dL  CBC   Collection Time: 05/07/24  2:20 PM  Result Value Ref Range   WBC 7.2 3.4 - 10.8 x10E3/uL   RBC 3.61 (L) 4.14 - 5.80 x10E6/uL   Hemoglobin 10.6 (L) 13.0 - 17.7 g/dL   Hematocrit 66.6 (L) 62.4 - 51.0 %   MCV 92 79 - 97 fL   MCH 29.4 26.6 - 33.0 pg   MCHC 31.8 31.5 - 35.7 g/dL   RDW 84.1 (H) 88.3 - 84.5 %   Platelets 211 150 - 450 x10E3/uL   Lab Results  Component Value Date   IRON 24 (L) 03/07/2024   TIBC 115 (L) 03/07/2024   FERRITIN 352 (H) 03/07/2024   Bedside TEE 03/12/2024: LVEF 55-60%, no LA appendage thrombus, mild MR, mild AR, aortic sclerosis without AS, no vegetation.   Assessment & Plan:   Problem List Items Addressed This Visit     Body mass index (BMI) of 30.0-30.9 in adult   FTT (failure to thrive) in adult - Primary   Weight gain noted. Continue mirtazapine  30mg  daily. Overall doing better from this standpoint.       PAF (paroxysmal atrial fibrillation) (HCC)   Overall sounding regular. Eliquis  remains on hold since recurrent rectal bleed necessitating several blood transfusions - will remain off anticoagulant.       Decubitus ulcer of right buttock, stage 1   Skin overall healed but with persistent erythema and discomfort - recommend start using OTC boudreaux butt paste or other zinc  barrier cream equivalent, continue frequent repositioning.       Right flank pain   New superficial R flank tightness feel limited to the skin, with tender erythema to skin at that area - cover possible developing cellulitis with keflex  7d course        Heart failure with recovered ejection fraction (HFrecEF) (HCC)   Remaining euvolemic.  Continue torsemide  20mg  every other day with metoprolol  12.5mg  bid. Spironolactone  recently stopped.  Stop lisinopril  as above       Bradycardia   Chronic, persistent, with arrhythmia - he continues metoprolol  12.5mg  bid. Will continue to monitor.       Hypotension   Blood pressures remaining low despite recently stopping spironolactone  - will hold lisinopril  2.5mg  as well.         Meds ordered this encounter  Medications   cephALEXin  (KEFLEX ) 500 MG capsule    Sig: Take 1 capsule (500 mg total) by mouth 3 (three) times daily.    Dispense:  21 capsule    Refill:  0    No orders of the defined types were placed in this encounter.   Patient Instructions  For tender swelling to right flank - take antibiotic sent to pharmacy - possible cellulitis developing Keflex  500mg  three times daily for 7 days.  Go ahead and stop lisinopril   Continue regular use of desitin or boudreaux butt cream to buttock area, let us  know if skin breakdown develops.  Return to see me in 6-8 weeks for follow up visit.   Follow up plan: Return in about 6 weeks (around 06/25/2024) for follow up visit.  Anton Blas, MD

## 2024-05-15 DIAGNOSIS — I959 Hypotension, unspecified: Secondary | ICD-10-CM | POA: Insufficient documentation

## 2024-05-15 NOTE — Assessment & Plan Note (Addendum)
 Skin overall healed but with persistent erythema and discomfort - recommend start using OTC boudreaux butt paste or other zinc  barrier cream equivalent, continue frequent repositioning.

## 2024-05-15 NOTE — Assessment & Plan Note (Addendum)
 New superficial R flank tightness feel limited to the skin, with tender erythema to skin at that area - cover possible developing cellulitis with keflex  7d course

## 2024-05-15 NOTE — Assessment & Plan Note (Addendum)
 Remaining euvolemic.  Continue torsemide  20mg  every other day with metoprolol  12.5mg  bid. Spironolactone  recently stopped.  Stop lisinopril  as above

## 2024-05-15 NOTE — Assessment & Plan Note (Signed)
 Weight gain noted. Continue mirtazapine  30mg  daily. Overall doing better from this standpoint.

## 2024-05-15 NOTE — Assessment & Plan Note (Addendum)
 Blood pressures remaining low despite recently stopping spironolactone  - will hold lisinopril  2.5mg  as well.

## 2024-05-15 NOTE — Assessment & Plan Note (Signed)
 Overall sounding regular. Eliquis  remains on hold since recurrent rectal bleed necessitating several blood transfusions - will remain off anticoagulant.

## 2024-05-15 NOTE — Assessment & Plan Note (Signed)
 Chronic, persistent, with arrhythmia - he continues metoprolol  12.5mg  bid. Will continue to monitor.

## 2024-05-16 DIAGNOSIS — N1831 Chronic kidney disease, stage 3a: Secondary | ICD-10-CM | POA: Diagnosis not present

## 2024-05-16 DIAGNOSIS — E440 Moderate protein-calorie malnutrition: Secondary | ICD-10-CM | POA: Diagnosis not present

## 2024-05-16 DIAGNOSIS — K922 Gastrointestinal hemorrhage, unspecified: Secondary | ICD-10-CM | POA: Diagnosis not present

## 2024-05-16 DIAGNOSIS — D75839 Thrombocytosis, unspecified: Secondary | ICD-10-CM | POA: Diagnosis not present

## 2024-05-16 DIAGNOSIS — I130 Hypertensive heart and chronic kidney disease with heart failure and stage 1 through stage 4 chronic kidney disease, or unspecified chronic kidney disease: Secondary | ICD-10-CM | POA: Diagnosis not present

## 2024-05-16 DIAGNOSIS — E1122 Type 2 diabetes mellitus with diabetic chronic kidney disease: Secondary | ICD-10-CM | POA: Diagnosis not present

## 2024-05-16 DIAGNOSIS — K650 Generalized (acute) peritonitis: Secondary | ICD-10-CM | POA: Diagnosis not present

## 2024-05-16 DIAGNOSIS — I480 Paroxysmal atrial fibrillation: Secondary | ICD-10-CM | POA: Diagnosis not present

## 2024-05-16 DIAGNOSIS — I5032 Chronic diastolic (congestive) heart failure: Secondary | ICD-10-CM | POA: Diagnosis not present

## 2024-05-19 DIAGNOSIS — E440 Moderate protein-calorie malnutrition: Secondary | ICD-10-CM | POA: Diagnosis not present

## 2024-05-19 DIAGNOSIS — I5032 Chronic diastolic (congestive) heart failure: Secondary | ICD-10-CM | POA: Diagnosis not present

## 2024-05-19 DIAGNOSIS — E1122 Type 2 diabetes mellitus with diabetic chronic kidney disease: Secondary | ICD-10-CM | POA: Diagnosis not present

## 2024-05-19 DIAGNOSIS — I130 Hypertensive heart and chronic kidney disease with heart failure and stage 1 through stage 4 chronic kidney disease, or unspecified chronic kidney disease: Secondary | ICD-10-CM | POA: Diagnosis not present

## 2024-05-19 DIAGNOSIS — I480 Paroxysmal atrial fibrillation: Secondary | ICD-10-CM | POA: Diagnosis not present

## 2024-05-19 DIAGNOSIS — D75839 Thrombocytosis, unspecified: Secondary | ICD-10-CM | POA: Diagnosis not present

## 2024-05-19 DIAGNOSIS — N1831 Chronic kidney disease, stage 3a: Secondary | ICD-10-CM | POA: Diagnosis not present

## 2024-05-19 DIAGNOSIS — K922 Gastrointestinal hemorrhage, unspecified: Secondary | ICD-10-CM | POA: Diagnosis not present

## 2024-05-19 DIAGNOSIS — K650 Generalized (acute) peritonitis: Secondary | ICD-10-CM | POA: Diagnosis not present

## 2024-05-20 DIAGNOSIS — I130 Hypertensive heart and chronic kidney disease with heart failure and stage 1 through stage 4 chronic kidney disease, or unspecified chronic kidney disease: Secondary | ICD-10-CM | POA: Diagnosis not present

## 2024-05-20 DIAGNOSIS — N1831 Chronic kidney disease, stage 3a: Secondary | ICD-10-CM | POA: Diagnosis not present

## 2024-05-20 DIAGNOSIS — D75839 Thrombocytosis, unspecified: Secondary | ICD-10-CM | POA: Diagnosis not present

## 2024-05-20 DIAGNOSIS — K650 Generalized (acute) peritonitis: Secondary | ICD-10-CM | POA: Diagnosis not present

## 2024-05-20 DIAGNOSIS — I480 Paroxysmal atrial fibrillation: Secondary | ICD-10-CM | POA: Diagnosis not present

## 2024-05-20 DIAGNOSIS — K922 Gastrointestinal hemorrhage, unspecified: Secondary | ICD-10-CM | POA: Diagnosis not present

## 2024-05-20 DIAGNOSIS — E440 Moderate protein-calorie malnutrition: Secondary | ICD-10-CM | POA: Diagnosis not present

## 2024-05-20 DIAGNOSIS — E1122 Type 2 diabetes mellitus with diabetic chronic kidney disease: Secondary | ICD-10-CM | POA: Diagnosis not present

## 2024-05-20 DIAGNOSIS — I5032 Chronic diastolic (congestive) heart failure: Secondary | ICD-10-CM | POA: Diagnosis not present

## 2024-05-21 DIAGNOSIS — K650 Generalized (acute) peritonitis: Secondary | ICD-10-CM | POA: Diagnosis not present

## 2024-05-21 DIAGNOSIS — I5032 Chronic diastolic (congestive) heart failure: Secondary | ICD-10-CM | POA: Diagnosis not present

## 2024-05-21 DIAGNOSIS — N1831 Chronic kidney disease, stage 3a: Secondary | ICD-10-CM | POA: Diagnosis not present

## 2024-05-21 DIAGNOSIS — D75839 Thrombocytosis, unspecified: Secondary | ICD-10-CM | POA: Diagnosis not present

## 2024-05-21 DIAGNOSIS — E440 Moderate protein-calorie malnutrition: Secondary | ICD-10-CM | POA: Diagnosis not present

## 2024-05-21 DIAGNOSIS — K922 Gastrointestinal hemorrhage, unspecified: Secondary | ICD-10-CM | POA: Diagnosis not present

## 2024-05-21 DIAGNOSIS — I130 Hypertensive heart and chronic kidney disease with heart failure and stage 1 through stage 4 chronic kidney disease, or unspecified chronic kidney disease: Secondary | ICD-10-CM | POA: Diagnosis not present

## 2024-05-21 DIAGNOSIS — I480 Paroxysmal atrial fibrillation: Secondary | ICD-10-CM | POA: Diagnosis not present

## 2024-05-21 DIAGNOSIS — E1122 Type 2 diabetes mellitus with diabetic chronic kidney disease: Secondary | ICD-10-CM | POA: Diagnosis not present

## 2024-05-22 ENCOUNTER — Encounter: Payer: Self-pay | Admitting: Podiatry

## 2024-05-22 ENCOUNTER — Ambulatory Visit: Admitting: Podiatry

## 2024-05-22 DIAGNOSIS — M79675 Pain in left toe(s): Secondary | ICD-10-CM

## 2024-05-22 DIAGNOSIS — E1142 Type 2 diabetes mellitus with diabetic polyneuropathy: Secondary | ICD-10-CM | POA: Diagnosis not present

## 2024-05-22 DIAGNOSIS — B351 Tinea unguium: Secondary | ICD-10-CM

## 2024-05-22 DIAGNOSIS — M79674 Pain in right toe(s): Secondary | ICD-10-CM

## 2024-05-25 NOTE — Progress Notes (Signed)
  Subjective:  Patient ID: [name] Young., male    DOB: 1941-08-25,  MRN: [id number]  [name] Young. presents to clinic today for at risk foot care with history of diabetic neuropathy and painful thick toenails that are difficult to trim. Pain interferes with ambulation. Aggravating factors include wearing enclosed shoe gear. Pain is relieved with periodic professional debridement. Patient has been hospitalized three times since his last visit. He is accompanied by his wife on today's visit. Chief Complaint  Patient presents with   [facility]    Rm1 Diabetic foot care/ Dr. Anton Blas last visit Sept. 3, 2025/ A1c 8.4   New problem(s): None.   PCP is Blas Anton, MD.  No Known Allergies  Review of Systems: Negative except as noted in the HPI.  Objective: No changes noted in today's physical examination. There were no vitals filed for this visit. [name] Young. is a pleasant 83 y.o. male in NAD. AAO x 3.  Vascular Examination: CFT <3 seconds b/l. DP/PT pulses faintly palpable b/l. Skin temperature gradient warm to warm b/l. No pain with calf compression. No ischemia or gangrene. No cyanosis or clubbing noted b/l. Pedal hair absent.   Neurological Examination: Protective sensation diminished with 10g monofilament b/l.  Dermatological Examination: Pedal skin warm and supple b/l.   No open wounds. No interdigital macerations.  Toenails left great toe and 2-5 b/l thick, discolored, elongated with subungual debris and pain on dorsal palpation.    Evidence of partial nail avulsion lateral border right hallux. No erythema, no edema, no drainage, no fluctuance..  No corns, calluses nor porokeratotic lesions noted.  Musculoskeletal Examination: Normal muscle strength 5/5 to all lower extremity muscle groups bilaterally. Uses walking stick for ambulation assistance.  Radiographs: None  Assessment/Plan: 1. Pain due to onychomycosis of toenails of both feet   2.  Diabetic polyneuropathy associated with type 2 diabetes mellitus [facility])   Consent given for treatment. Patient examined. All patient's and/or POA's questions/concerns addressed on today's visit. Toenails 1-5 debrided in length and girth without incident. Continue foot and shoe inspections daily. Monitor blood glucose per PCP/Endocrinologist's recommendations. Continue soft, supportive shoe gear daily. Report any pedal injuries to medical professional. Call office if there are any questions/concerns. -Patient/POA to call should there be question/concern in the interim.   Return in about 3 months (around 08/21/2024).  Delon LITTIE Merlin, DPM      [place] LOCATION: [address], [place] [address]                   Office [phone]   [facility] LOCATION: [address] [place], [place] [address] Office [phone]

## 2024-05-27 DIAGNOSIS — K650 Generalized (acute) peritonitis: Secondary | ICD-10-CM | POA: Diagnosis not present

## 2024-05-27 DIAGNOSIS — E440 Moderate protein-calorie malnutrition: Secondary | ICD-10-CM | POA: Diagnosis not present

## 2024-05-27 DIAGNOSIS — N1831 Chronic kidney disease, stage 3a: Secondary | ICD-10-CM | POA: Diagnosis not present

## 2024-05-27 DIAGNOSIS — I480 Paroxysmal atrial fibrillation: Secondary | ICD-10-CM | POA: Diagnosis not present

## 2024-05-27 DIAGNOSIS — D75839 Thrombocytosis, unspecified: Secondary | ICD-10-CM | POA: Diagnosis not present

## 2024-05-27 DIAGNOSIS — I5032 Chronic diastolic (congestive) heart failure: Secondary | ICD-10-CM | POA: Diagnosis not present

## 2024-05-27 DIAGNOSIS — I130 Hypertensive heart and chronic kidney disease with heart failure and stage 1 through stage 4 chronic kidney disease, or unspecified chronic kidney disease: Secondary | ICD-10-CM | POA: Diagnosis not present

## 2024-05-27 DIAGNOSIS — K922 Gastrointestinal hemorrhage, unspecified: Secondary | ICD-10-CM | POA: Diagnosis not present

## 2024-05-27 DIAGNOSIS — E1122 Type 2 diabetes mellitus with diabetic chronic kidney disease: Secondary | ICD-10-CM | POA: Diagnosis not present

## 2024-05-28 DIAGNOSIS — K650 Generalized (acute) peritonitis: Secondary | ICD-10-CM | POA: Diagnosis not present

## 2024-05-28 DIAGNOSIS — I480 Paroxysmal atrial fibrillation: Secondary | ICD-10-CM | POA: Diagnosis not present

## 2024-05-28 DIAGNOSIS — I5032 Chronic diastolic (congestive) heart failure: Secondary | ICD-10-CM | POA: Diagnosis not present

## 2024-05-28 DIAGNOSIS — E1122 Type 2 diabetes mellitus with diabetic chronic kidney disease: Secondary | ICD-10-CM | POA: Diagnosis not present

## 2024-05-28 DIAGNOSIS — K922 Gastrointestinal hemorrhage, unspecified: Secondary | ICD-10-CM | POA: Diagnosis not present

## 2024-05-28 DIAGNOSIS — N1831 Chronic kidney disease, stage 3a: Secondary | ICD-10-CM | POA: Diagnosis not present

## 2024-05-28 DIAGNOSIS — D75839 Thrombocytosis, unspecified: Secondary | ICD-10-CM | POA: Diagnosis not present

## 2024-05-28 DIAGNOSIS — I130 Hypertensive heart and chronic kidney disease with heart failure and stage 1 through stage 4 chronic kidney disease, or unspecified chronic kidney disease: Secondary | ICD-10-CM | POA: Diagnosis not present

## 2024-05-28 DIAGNOSIS — E440 Moderate protein-calorie malnutrition: Secondary | ICD-10-CM | POA: Diagnosis not present

## 2024-06-03 DIAGNOSIS — I130 Hypertensive heart and chronic kidney disease with heart failure and stage 1 through stage 4 chronic kidney disease, or unspecified chronic kidney disease: Secondary | ICD-10-CM | POA: Diagnosis not present

## 2024-06-03 DIAGNOSIS — I480 Paroxysmal atrial fibrillation: Secondary | ICD-10-CM | POA: Diagnosis not present

## 2024-06-03 DIAGNOSIS — N1831 Chronic kidney disease, stage 3a: Secondary | ICD-10-CM | POA: Diagnosis not present

## 2024-06-03 DIAGNOSIS — K650 Generalized (acute) peritonitis: Secondary | ICD-10-CM | POA: Diagnosis not present

## 2024-06-03 DIAGNOSIS — K922 Gastrointestinal hemorrhage, unspecified: Secondary | ICD-10-CM | POA: Diagnosis not present

## 2024-06-03 DIAGNOSIS — D75839 Thrombocytosis, unspecified: Secondary | ICD-10-CM | POA: Diagnosis not present

## 2024-06-03 DIAGNOSIS — I5032 Chronic diastolic (congestive) heart failure: Secondary | ICD-10-CM | POA: Diagnosis not present

## 2024-06-03 DIAGNOSIS — E440 Moderate protein-calorie malnutrition: Secondary | ICD-10-CM | POA: Diagnosis not present

## 2024-06-03 DIAGNOSIS — E1122 Type 2 diabetes mellitus with diabetic chronic kidney disease: Secondary | ICD-10-CM | POA: Diagnosis not present

## 2024-06-04 DIAGNOSIS — E440 Moderate protein-calorie malnutrition: Secondary | ICD-10-CM | POA: Diagnosis not present

## 2024-06-04 DIAGNOSIS — D75839 Thrombocytosis, unspecified: Secondary | ICD-10-CM | POA: Diagnosis not present

## 2024-06-04 DIAGNOSIS — I130 Hypertensive heart and chronic kidney disease with heart failure and stage 1 through stage 4 chronic kidney disease, or unspecified chronic kidney disease: Secondary | ICD-10-CM | POA: Diagnosis not present

## 2024-06-04 DIAGNOSIS — K650 Generalized (acute) peritonitis: Secondary | ICD-10-CM | POA: Diagnosis not present

## 2024-06-04 DIAGNOSIS — N1831 Chronic kidney disease, stage 3a: Secondary | ICD-10-CM | POA: Diagnosis not present

## 2024-06-04 DIAGNOSIS — E1122 Type 2 diabetes mellitus with diabetic chronic kidney disease: Secondary | ICD-10-CM | POA: Diagnosis not present

## 2024-06-04 DIAGNOSIS — K922 Gastrointestinal hemorrhage, unspecified: Secondary | ICD-10-CM | POA: Diagnosis not present

## 2024-06-04 DIAGNOSIS — I5032 Chronic diastolic (congestive) heart failure: Secondary | ICD-10-CM | POA: Diagnosis not present

## 2024-06-04 DIAGNOSIS — I480 Paroxysmal atrial fibrillation: Secondary | ICD-10-CM | POA: Diagnosis not present

## 2024-06-08 DIAGNOSIS — M6281 Muscle weakness (generalized): Secondary | ICD-10-CM | POA: Diagnosis not present

## 2024-06-19 ENCOUNTER — Encounter: Payer: Self-pay | Admitting: Pharmacist

## 2024-06-19 NOTE — Progress Notes (Signed)
 Pharmacy Quality Measure Review  This patient is appearing on a report for being at risk of failing the adherence measure for cholesterol (statin) medications this calendar year.   Medication: rosuvastatin  10mg  Last fill date: 12/19/23 for 90 day supply  Insurance report was not up to date. No action needed at this time.  Medication has been refilled as of 05/26/24 x90ds.  1 additional 90d refill remaining. Next refill due 08/24/24 - Reminder set

## 2024-06-25 ENCOUNTER — Encounter: Payer: Self-pay | Admitting: Family Medicine

## 2024-06-25 ENCOUNTER — Ambulatory Visit: Admitting: Family Medicine

## 2024-06-25 VITALS — BP 120/54 | HR 68 | Temp 97.8°F | Ht 70.0 in | Wt 217.1 lb

## 2024-06-25 DIAGNOSIS — I493 Ventricular premature depolarization: Secondary | ICD-10-CM | POA: Diagnosis not present

## 2024-06-25 DIAGNOSIS — I5020 Unspecified systolic (congestive) heart failure: Secondary | ICD-10-CM | POA: Diagnosis not present

## 2024-06-25 DIAGNOSIS — R627 Adult failure to thrive: Secondary | ICD-10-CM

## 2024-06-25 DIAGNOSIS — Z23 Encounter for immunization: Secondary | ICD-10-CM

## 2024-06-25 DIAGNOSIS — I480 Paroxysmal atrial fibrillation: Secondary | ICD-10-CM

## 2024-06-25 DIAGNOSIS — I110 Hypertensive heart disease with heart failure: Secondary | ICD-10-CM

## 2024-06-25 DIAGNOSIS — E1169 Type 2 diabetes mellitus with other specified complication: Secondary | ICD-10-CM | POA: Diagnosis not present

## 2024-06-25 DIAGNOSIS — I10 Essential (primary) hypertension: Secondary | ICD-10-CM

## 2024-06-25 LAB — POCT GLYCOSYLATED HEMOGLOBIN (HGB A1C): Hemoglobin A1C: 5.5 % (ref 4.0–5.6)

## 2024-06-25 MED ORDER — METOPROLOL TARTRATE 25 MG PO TABS: 12.5000 mg | ORAL_TABLET | Freq: Two times a day (BID) | ORAL | 3 refills | Status: AC

## 2024-06-25 NOTE — Progress Notes (Addendum)
 Ph: [phone] Fax: [phone]   Patient ID: [name] LULLA Bernardo Mickey., male    DOB: June 22, 1941, 83 y.o.   MRN: [id number]  This visit was conducted in person.  BP (!) 120/54 (BP Location: Right Arm, Cuff Size: Large)   Pulse 68   Temp 97.8 F (36.6 C) (Oral)   Ht 5' 10 (1.778 m)   Wt 217 lb 2 oz (98.5 kg)   SpO2 98%   BMI 31.15 kg/m   BP Readings from Last 3 Encounters:  06/25/24 (!) 120/54  05/14/24 98/60  05/07/24 (!) 100/50  Right arm: 120/50s Left arm: 112/50s  CC: 6 wk f/u visit  Subjective:   HPI: [name] Morss. is a 83 y.o. male presenting on 06/25/2024 for Medical Management of Chronic Issues (Pt here for 6 wk f//u/Pt is accompanied by wife [name] Young)   See prior note for details.  Recent hospitalization x2 for recurrent perihepatic abscess, initially treated with IV abx.  Hospital course complicated by AKI, enterococcus faecalis bacteremia, hypotension and acute blood loss anemia s/p 3u pRBC transfusion. TEE 03/12/2024 without signs of endocarditis.    Subsequent hospitalization from SNF for rectal bleeding, again s/p 2u pRBC. Eliquis  discontinued. CT angio 03/22/2024 did not identify source of bleed. Remains off eliquis . Oral iron stopped due to constipation.  Subsequently spironolactone  and lisinopril  stopped due to hypotension.   Continued metoprolol  12.5mg  bid and torsemide  20mg  every other day.   19 lb weight gain since 03/2024 on mirtazapine  30mg  nightly.   Overall feeling better than 6 weeks ago. Ongoing exertional dyspnea. Uses cane.      Relevant past medical, surgical, family and social history reviewed and updated as indicated. Interim medical history since our last visit reviewed. Allergies and medications reviewed and updated. Outpatient Medications Prior to Visit  Medication Sig Dispense Refill   aspirin  EC 81 MG tablet Take 81 mg by mouth daily. Swallow whole.     bisacodyl  (DULCOLAX) 5 MG EC tablet Take 10 mg by mouth at bedtime.      Cholecalciferol  (VITAMIN D3) 25 MCG (1000 UT) CAPS Take 1 capsule (1,000 Units total) by mouth daily. 30 capsule    Cyanocobalamin  (B-12) 1000 MCG CAPS Take 1 capsule by mouth every Monday, Wednesday, and Friday.     feeding supplement (ENSURE ENLIVE / ENSURE PLUS) LIQD Take 237 mLs by mouth 2 (two) times daily between meals. (Patient taking differently: Take 237 mLs by mouth daily.) 14220 mL 0   glucose blood (TRUE METRIX BLOOD GLUCOSE TEST) test strip Use as instructed to check blood sugar once a day 100 each 3   Magnesium  Oxide -Mg Supplement 250 MG TABS Take 1 tablet by mouth daily.     mirtazapine  (REMERON ) 30 MG tablet Take 1 tablet (30 mg total) by mouth at bedtime. 90 tablet 1   Multiple Vitamins-Minerals (PRESERVISION AREDS 2 PO) Take 1 capsule by mouth daily.     Nystatin  (GERHARDT'S BUTT CREAM) CREA Apply 1 Application topically 2 (two) times daily. 1 each 0   polyethylene glycol powder (GLYCOLAX /MIRALAX ) 17 GM/SCOOP powder Take 8.5-17 g by mouth daily as needed for moderate constipation.     rosuvastatin  (CRESTOR ) 10 MG tablet TAKE 1 TABLET BY MOUTH EVERY DAY 90 tablet 2   tamsulosin  (FLOMAX ) 0.4 MG CAPS capsule Take 1 capsule (0.4 mg total) by mouth daily. 90 capsule 4   torsemide  (DEMADEX ) 20 MG tablet Take 1 tablet (20 mg total) by mouth every other day.  acidophilus (RISAQUAD) CAPS capsule Take 1 capsule by mouth 2 (two) times daily.     metoprolol  tartrate (LOPRESSOR ) 25 MG tablet Take 0.5 tablets (12.5 mg total) by mouth 2 (two) times daily.     cephALEXin  (KEFLEX ) 500 MG capsule Take 1 capsule (500 mg total) by mouth 3 (three) times daily. (Patient not taking: Reported on 06/25/2024) 21 capsule 0   No facility-administered medications prior to visit.     Per HPI unless specifically indicated in ROS section below Review of Systems  Objective:  BP (!) 120/54 (BP Location: Right Arm, Cuff Size: Large)   Pulse 68   Temp 97.8 F (36.6 C) (Oral)   Ht 5' 10 (1.778 m)   Wt  217 lb 2 oz (98.5 kg)   SpO2 98%   BMI 31.15 kg/m   Wt Readings from Last 3 Encounters:  06/25/24 217 lb 2 oz (98.5 kg)  05/14/24 209 lb 2 oz (94.9 kg)  05/07/24 210 lb 12.8 oz (95.6 kg)      Physical Exam Vitals and nursing note reviewed.  Constitutional:      Appearance: Normal appearance. He is not ill-appearing.     Comments:  Tired appearing Ambulates with cane  HENT:     Mouth/Throat:     Mouth: Mucous membranes are moist.     Pharynx: Oropharynx is clear. No oropharyngeal exudate or posterior oropharyngeal erythema.  Eyes:     Extraocular Movements: Extraocular movements intact.     Pupils: Pupils are equal, round, and reactive to light.  Cardiovascular:     Rate and Rhythm: Bradycardia present. Rhythm irregular. Frequent Extrasystoles are present.    Pulses: Normal pulses.     Heart sounds: Normal heart sounds. No murmur heard. Pulmonary:     Effort: Pulmonary effort is normal. No respiratory distress.     Breath sounds: Normal breath sounds. No wheezing, rhonchi or rales.  Musculoskeletal:     Cervical back: Normal range of motion and neck supple.     Right lower leg: No edema.     Left lower leg: No edema.  Skin:    General: Skin is warm and dry.     Findings: No rash.  Neurological:     Mental Status: He is alert.  Psychiatric:        Mood and Affect: Mood normal.        Behavior: Behavior normal.       Results for orders placed or performed in visit on 06/25/24  POCT glycosylated hemoglobin (Hb A1C)   Collection Time: 06/25/24 11:32 AM  Result Value Ref Range   Hemoglobin A1C 5.5 4.0 - 5.6 %   HbA1c POC (<> result, manual entry)     HbA1c, POC (prediabetic range)     HbA1c, POC (controlled diabetic range)     Lab Results  Component Value Date   NA 142 05/07/2024   CL 102 05/07/2024   K 5.1 05/07/2024   CO2 21 05/07/2024   BUN 43 (H) 05/07/2024   CREATININE 1.20 05/07/2024   EGFR 61 05/07/2024   CALCIUM  9.0 05/07/2024   PHOS 2.9 03/13/2024    ALBUMIN  3.8 04/08/2024   GLUCOSE 94 05/07/2024    Lab Results  Component Value Date   WBC 7.2 05/07/2024   HGB 10.6 (L) 05/07/2024   HCT 33.3 (L) 05/07/2024   MCV 92 05/07/2024   PLT 211 05/07/2024    EKG - NSR rate 75, frequent PVCs/trigeminy, normal axis, intervals, no hypertrophy, nonspecific T  wave flattening  Assessment & Plan:   Problem List Items Addressed This Visit     Type 2 diabetes mellitus with other specified complication (HCC) - Primary   Great control in setting of weight loss, continue watching diet. Not on antihyperglycemic medication. Diabetic associated with hypertension, heart failure, other vascular disease.       Relevant Orders   POCT glycosylated hemoglobin (Hb A1C) (Completed)   FTT (failure to thrive) in adult   Ongoing weight gain noted.  He continues slowly recovering -with improvement in energy and strength each month.  Continue mirtazapine  30mg  nightly which he benefits from.      PAF (paroxysmal atrial fibrillation) (HCC)   In sinus rhythm based on EKG today.  Remain off eliquis  in h/o recurrent rectal bleed earlier in the year requiring several blood transfusions. Remain on low dose metoprolol  12.5mg  bid      Relevant Medications   metoprolol  tartrate (LOPRESSOR ) 25 MG tablet   Other Relevant Orders   EKG 12-Lead   Heart failure with recovered ejection fraction (HFrecEF) (HCC)   Drop torsemide  to 20mg  MWF. Continue BB.  Remain off spironolactone , lisinopril .       Relevant Medications   metoprolol  tartrate (LOPRESSOR ) 25 MG tablet   Frequent PVCs   Abnormal irregular heart beat noted today along with possibly marked bradycardia on auscultation.  Update EKG.       Relevant Medications   metoprolol  tartrate (LOPRESSOR ) 25 MG tablet   Other Relevant Orders   EKG 12-Lead   Essential hypertension   BP has been running low - has been off lisinopril  and spironolactone , only on torsemide  20mg  every other day an metoprolol  12.5mg  bid  - continue this regimen.       Relevant Medications   metoprolol  tartrate (LOPRESSOR ) 25 MG tablet   Other Visit Diagnoses       Encounter for immunization       Relevant Orders   Flu vaccine HIGH DOSE PF(Fluzone Trivalent) (Completed)        Meds ordered this encounter  Medications   metoprolol  tartrate (LOPRESSOR ) 25 MG tablet    Sig: Take 0.5 tablets (12.5 mg total) by mouth 2 (two) times daily.    Dispense:  90 tablet    Refill:  3    Orders Placed This Encounter  Procedures   Flu vaccine HIGH DOSE PF(Fluzone Trivalent)   POCT glycosylated hemoglobin (Hb A1C)   EKG 12-Lead    Patient Instructions  Flu shot today  EKG today  Drop torsemide  to MWF dosing.  Continue metoprolol .  Return in 3 months for physical  Follow up plan: Return in about 3 months (around 09/25/2024) for annual exam, prior fasting for blood work, medicare wellness visit.  Anton Blas, MD

## 2024-06-25 NOTE — Assessment & Plan Note (Addendum)
 Great control in setting of weight loss, continue watching diet. Not on antihyperglycemic medication. Diabetic associated with hypertension, heart failure, other vascular disease.

## 2024-06-25 NOTE — Patient Instructions (Addendum)
 Flu shot today  EKG today  Drop torsemide  to MWF dosing.  Continue metoprolol .  Return in 3 months for physical

## 2024-06-25 NOTE — Assessment & Plan Note (Signed)
 Abnormal irregular heart beat noted today along with possibly marked bradycardia on auscultation.  Update EKG.

## 2024-06-25 NOTE — Assessment & Plan Note (Signed)
 Drop torsemide  to 20mg  MWF. Continue BB.  Remain off spironolactone , lisinopril .

## 2024-06-25 NOTE — Assessment & Plan Note (Signed)
 BP has been running low - has been off lisinopril  and spironolactone , only on torsemide  20mg  every other day an metoprolol  12.5mg  bid - continue this regimen.

## 2024-06-25 NOTE — Assessment & Plan Note (Addendum)
 Ongoing weight gain noted.  He continues slowly recovering -with improvement in energy and strength each month.  Continue mirtazapine  30mg  nightly which he benefits from.

## 2024-06-25 NOTE — Assessment & Plan Note (Addendum)
 In sinus rhythm based on EKG today.  Remain off eliquis  in h/o recurrent rectal bleed earlier in the year requiring several blood transfusions. Remain on low dose metoprolol  12.5mg  bid

## 2024-06-26 ENCOUNTER — Encounter: Payer: Self-pay | Admitting: Family Medicine

## 2024-07-22 ENCOUNTER — Encounter: Payer: Self-pay | Admitting: Pharmacist

## 2024-07-22 NOTE — Progress Notes (Signed)
 Pharmacy Quality Measure Review  This patient is appearing on a report for being at risk of failing the adherence measure for hypertension (ACEi/ARB) medications this calendar year.   Medication: lisinopril  2.5 mg Last fill date: 04/23/24 for 90 day supply  Per chart notes, patient no longer taking due to hypotension.  Of note, appears he still filled this at [facility] on 07/12/24 x90ds, may have been auto-refill? No further action needed.

## 2024-07-29 ENCOUNTER — Encounter: Payer: Self-pay | Admitting: Pulmonary Disease

## 2024-08-08 DIAGNOSIS — M6281 Muscle weakness (generalized): Secondary | ICD-10-CM | POA: Diagnosis not present

## 2024-08-15 ENCOUNTER — Encounter: Payer: Self-pay | Admitting: Internal Medicine

## 2024-08-15 ENCOUNTER — Ambulatory Visit: Payer: Self-pay | Admitting: Internal Medicine

## 2024-08-15 ENCOUNTER — Other Ambulatory Visit
Admission: RE | Admit: 2024-08-15 | Discharge: 2024-08-15 | Disposition: A | Source: Ambulatory Visit | Attending: Internal Medicine | Admitting: Internal Medicine

## 2024-08-15 ENCOUNTER — Ambulatory Visit: Attending: Internal Medicine | Admitting: Internal Medicine

## 2024-08-15 ENCOUNTER — Ambulatory Visit: Payer: Self-pay

## 2024-08-15 VITALS — BP 118/48 | HR 73 | Ht 70.0 in | Wt 232.6 lb

## 2024-08-15 DIAGNOSIS — I2510 Atherosclerotic heart disease of native coronary artery without angina pectoris: Secondary | ICD-10-CM

## 2024-08-15 DIAGNOSIS — I5022 Chronic systolic (congestive) heart failure: Secondary | ICD-10-CM

## 2024-08-15 DIAGNOSIS — Z79899 Other long term (current) drug therapy: Secondary | ICD-10-CM

## 2024-08-15 DIAGNOSIS — L02217 Cutaneous abscess of flank: Secondary | ICD-10-CM | POA: Diagnosis not present

## 2024-08-15 DIAGNOSIS — I480 Paroxysmal atrial fibrillation: Secondary | ICD-10-CM

## 2024-08-15 LAB — CBC WITH DIFFERENTIAL/PLATELET
Abs Immature Granulocytes: 0.04 K/uL (ref 0.00–0.07)
Basophils Absolute: 0 K/uL (ref 0.0–0.1)
Basophils Relative: 0 %
Eosinophils Absolute: 0.2 K/uL (ref 0.0–0.5)
Eosinophils Relative: 3 %
HCT: 33.2 % — ABNORMAL LOW (ref 39.0–52.0)
Hemoglobin: 10.1 g/dL — ABNORMAL LOW (ref 13.0–17.0)
Immature Granulocytes: 1 %
Lymphocytes Relative: 23 %
Lymphs Abs: 1.6 K/uL (ref 0.7–4.0)
MCH: 28.1 pg (ref 26.0–34.0)
MCHC: 30.4 g/dL (ref 30.0–36.0)
MCV: 92.2 fL (ref 80.0–100.0)
Monocytes Absolute: 0.5 K/uL (ref 0.1–1.0)
Monocytes Relative: 8 %
Neutro Abs: 4.5 K/uL (ref 1.7–7.7)
Neutrophils Relative %: 65 %
Platelets: 306 K/uL (ref 150–400)
RBC: 3.6 MIL/uL — ABNORMAL LOW (ref 4.22–5.81)
RDW: 15.1 % (ref 11.5–15.5)
WBC: 6.9 K/uL (ref 4.0–10.5)
nRBC: 0 % (ref 0.0–0.2)

## 2024-08-15 LAB — COMPREHENSIVE METABOLIC PANEL WITH GFR
ALT: 25 U/L (ref 0–44)
AST: 28 U/L (ref 15–41)
Albumin: 3.7 g/dL (ref 3.5–5.0)
Alkaline Phosphatase: 74 U/L (ref 38–126)
Anion gap: 12 (ref 5–15)
BUN: 27 mg/dL — ABNORMAL HIGH (ref 8–23)
CO2: 28 mmol/L (ref 22–32)
Calcium: 9.5 mg/dL (ref 8.9–10.3)
Chloride: 102 mmol/L (ref 98–111)
Creatinine, Ser: 1.08 mg/dL (ref 0.61–1.24)
GFR, Estimated: >60 mL/min (ref >60)
Glucose, Bld: 108 mg/dL — ABNORMAL HIGH (ref 70–99)
Potassium: 5.1 mmol/L (ref 3.5–5.1)
Sodium: 142 mmol/L (ref 135–145)
Total Bilirubin: 0.4 mg/dL (ref 0.0–1.2)
Total Protein: 7.9 g/dL (ref 6.5–8.1)

## 2024-08-15 MED ORDER — AMOXICILLIN-POT CLAVULANATE 875-125 MG PO TABS: 1.0000 | ORAL_TABLET | Freq: Two times a day (BID) | ORAL | 0 refills | Status: DC

## 2024-08-15 NOTE — Patient Instructions (Signed)
 Medication Instructions:  Your physician recommends the following medication changes.  START TAKING: Augmentin  875-125 mg by mouth twice a day for 7 days.    *If you need a refill on your cardiac medications before your next appointment, please call your pharmacy*  Lab Work: Your provider would like for you to have following labs drawn today CBC/Diff, CMP.     Testing/Procedures: No test ordered today   Follow-Up: At [facility], you and your health needs are our priority.  As part of our continuing mission to provide you with exceptional heart care, our providers are all part of one team.  This team includes your primary Cardiologist (physician) and Advanced Practice Providers or APPs (Physician Assistants and Nurse Practitioners) who all work together to provide you with the care you need, when you need it.  Your next appointment:   3 month(s)  Provider:   You may see Lonni Hanson, MD or one of the following Advanced Practice Providers on your designated Care Team:   Lonni Meager, NP Lesley Maffucci, PA-C Bernardino Bring, PA-C Cadence [place], PA-C Tylene Lunch, NP Barnie Hila, NP

## 2024-08-15 NOTE — Telephone Encounter (Signed)
 FYI Only or Action Required?: FYI only for provider: appointment scheduled on 12.8.25.  Patient was last seen in primary care on 06/25/2024 by Rilla Baller, MD.  Called Nurse Triage reporting Wound Infection.  Symptoms began yesterday.  Interventions attempted: Other: bandaged and neosporin.  Symptoms are: unchanged.  Triage Disposition: See Physician Within 24 Hours  Patient/caregiver understands and will follow disposition?: Yes    Copied from CRM #[id number]. Topic: Clinical - Red Word Triage >> Aug 15, 2024  9:59 AM Hadassah PARAS wrote: Red Word that prompted transfer to Nurse Triage: Pt's wife on the line [name], states pt has an abcess, sore, pus filled on right back side. Transferred to NT Reason for Disposition  [1] Looks infected (e.g., spreading redness, pus) AND [2] no fever  Answer Assessment - Initial Assessment Questions Pt's wife called, she was giving him a bath last night and a scab fell off when she was washing it and stuff came out that looked like vanilla pudding. Right lower back about flank area, above his waist line, size of dime or smaller. Pt has stated it was sore. No fever or higher acuity questions. RN advised pt should be seen today because if this is an infection, should be treated sooner rather than later, advised no appts at [place], attempted to schedule at another location. She states she is with his cardiologist right now and he said he was going to bandage it up until they could see Dr. KANDICE and he thought Monday would be fine. Pt's wife refused appt today and scheduled with Dr. KANDICE on Monday. Rn advised if redness spreads or he develops a fever, he needs to go to the ER over the weekend. She stated understanding.     1. LOCATION: Where is the wound located?      Right lower back 2. WOUND APPEARANCE: What does the wound look like?      Redness 3. SIZE: If redness is present, ask: What is the size of the red area? (Inches, centimeters, or  compare to size of a coin)      Less than a dime 4. SPREAD: What's changed in the last day?  Do you see any red streaks coming from the wound?     No streaks, noticed the drainage 5. ONSET: When did it start to look infected?      Last night 6. MECHANISM: How did the wound start, what was the cause?     unknown 7. PAIN: Do you have any pain?  If Yes, ask: How bad is the pain?  (e.g., Scale 1-10; mild, moderate, or severe)     mild 8. FEVER: Do you have a fever? If Yes, ask: What is your temperature, how was it measured, and when did it start?     denies 9. OTHER SYMPTOMS: Do you have any other symptoms? (e.g., shaking chills, weakness, rash elsewhere on body)     denies  Protocols used: Wound Infection Suspected-A-AH

## 2024-08-15 NOTE — Telephone Encounter (Signed)
 Called patient reviewed all information and repeated back to me. Will call if any questions.  ? ?

## 2024-08-15 NOTE — Telephone Encounter (Signed)
 Noted. Will see Monday. Recommend warm compresses to area 3 times a day until seen.

## 2024-08-15 NOTE — Progress Notes (Signed)
 Cardiology Office Note:  .   Date:  08/15/2024  ID:  [name] LULLA Bernardo Mickey., DOB [birth year], MRN [id number] PCP: Rilla Baller, MD  [facility] Providers Cardiologist:  None     History of Present Illness: .   [name] Balogh. is a 83 y.o. male with history of three-vessel CAD status post CABG (01/2018) complicated by postoperative atrial fibrillation (transient atrial fibrillation recurred in the setting of sepsis related to ascending cholangitis), ascending cholangitis complicated by recurrent atrial fibrillation, heart failure with recovered ejection fraction (LVEF 40-45% -> 50-55%), and mitral valve endocarditis, as well as hypertension, hyperlipidemia, type 2 diabetes mellitus, hepatic abscess, and chronic kidney disease stage III, who present for follow-up of CAD and HF.  I last saw him in late August, at which time he was gradually recovering from his recurrent perihepatic abscess.  Today, he reports that he continue to feel week with limited exercise capacity, though he was able to accompany his family on a bear hunting trip a week ago.  He denies chest pain, shortness of breath, palpitations, lightheadedness, and edema.  [name] Young notes that he developed right flank pain/tenderness 3-4 days ago.  Last night, his wife noticed a small hole near the site of his prior abscess drain.  She was able to express ~1/2 tablespoon of purulent material.  [name] Young denies fevers and chills, though he has been coughing a bit more over the last week and thinks that he may have had a cold.  ROS: See HPI  Studies Reviewed: SABRA   EKG Interpretation Date/Time:  Friday August 15 2024 09:51:00 EST Ventricular Rate:  73 PR Interval:    QRS Duration:  84 QT Interval:  394 QTC Calculation: 434 R Axis:   -1  Text Interpretation: Normal sinus rhythm with occasional Premature ventricular complexes Abnormal ECG When compared with ECG of 07-May-2024 13:55, No significant change was found  Confirmed by Jyaire Koudelka, Lonni [phone]) on 08/15/2024 2:50:53 PM    Risk Assessment/Calculations:    CHA2DS2-VASc Score = 6   This indicates a 9.7% annual risk of stroke. The patient's score is based upon: CHF History: 1 HTN History: 1 Diabetes History: 1 Stroke History: 0 Vascular Disease History: 1 Age Score: 2 Gender Score: 0            Physical Exam:   VS:  BP (!) 118/48 (BP Location: Left Arm, Patient Position: Sitting, Cuff Size: Normal)   Pulse 73   Ht 5' 10 (1.778 m)   Wt 232 lb 9.6 oz (105.5 kg)   SpO2 97%   BMI 33.37 kg/m    Wt Readings from Last 3 Encounters:  08/15/24 232 lb 9.6 oz (105.5 kg)  06/25/24 217 lb 2 oz (98.5 kg)  05/14/24 209 lb 2 oz (94.9 kg)    General:  NAD. Neck: No JVD or HJR. Lungs: Clear to auscultation bilaterally without wheezes or crackles. Heart: Regular rate and rhythm with 2/6 systolic murmur. Abdomen: Soft, nontender, nondistended.  Right flank is mildly tender with pustule and surrounding induration.  There is no fluctuance.  I am unable to express any discharge. Extremities: Trace pretibial edema bilaterally.  ASSESSMENT AND PLAN: .    CAD: No angina.  Continue current medications for secondary prevention.  Chronic HFmrEF: Volume status appears stable.  Weight continue to trend up, though I suspect this is related to improving nutrition following prolonged illness related to perihepatic abscess this summer.  Continue torsemide  20 mg every other day.  Paroxysmal  atrial fibrillation: EKG today shows sinus rhythm with PVC's.  Given severe anemia in the setting of iatrogenic hemothorax and concern for recurrent flank abscess, we will defer resumption of anticoagulation for now.  Right flank/perihepatic abscess: Abscess had resolved with drainage and antibiotics.  I am concerned that he may be experiencing a recurrence, given new pain/tenderness and purulent drainage reported by his wife.  We discussed ED evaluation and urgent CT,  which [name] Young wishes to defer.  We will check a CBC and CMP today and initiate Augmentin  875/125 mg BID x 7 days.  I have spoken with Dr. Epifanio, who will reach out to [name] Young to readdress CT imaging and further management.  [name] Young also has an appointment scheduled for Monday with Dr. Rilla.  Hyperlipidemia: Continue rosuvastatin  10 mg daily.  Repeat fasting lipid panel at follow-up.    Dispo: Return to clinic in 3 months.  Signed, Lonni Hanson, MD

## 2024-08-18 ENCOUNTER — Encounter: Payer: Self-pay | Admitting: Family Medicine

## 2024-08-18 ENCOUNTER — Ambulatory Visit: Admitting: Family Medicine

## 2024-08-18 ENCOUNTER — Other Ambulatory Visit: Payer: Self-pay | Admitting: Infectious Diseases

## 2024-08-18 VITALS — BP 120/70 | HR 73 | Temp 97.9°F | Ht 70.0 in | Wt 230.6 lb

## 2024-08-18 DIAGNOSIS — K750 Abscess of liver: Secondary | ICD-10-CM

## 2024-08-18 DIAGNOSIS — R10A1 Flank pain, right side: Secondary | ICD-10-CM

## 2024-08-18 DIAGNOSIS — L02211 Cutaneous abscess of abdominal wall: Secondary | ICD-10-CM

## 2024-08-18 DIAGNOSIS — K650 Generalized (acute) peritonitis: Secondary | ICD-10-CM

## 2024-08-18 NOTE — Progress Notes (Unsigned)
 Ph: [phone] Fax: [phone]   Patient ID: [name] Young., male    DOB: November 25, 1940, 83 y.o.   MRN: [id number]  This visit was conducted in person.  BP 120/70 (Cuff Size: Normal)   Pulse 73   Temp 97.9 F (36.6 C) (Oral)   Ht 5' 10 (1.778 m)   Wt 230 lb 9.6 oz (104.6 kg)   SpO2 98%   BMI 33.09 kg/m    CC: check wound to R back.  Subjective:   HPI: [name] Young. is a 83 y.o. male presenting on 08/18/2024 for Acute Visit (Right side back pain onset 3 weeks,/Wound onset 12/5, pt noticed thick yellow pus, no fevers at home/ /Mrs. Catherine in room with pt)   About 3 weeks ago noted scab to right flank that fell off while showering followed by discharge that came out.   Saw cardiology last week. Labwork checked as per below, started on augmentin  7d course. Reached out to ID - with planned CT abd/pelvis for further evaluation and r/o recurrent perihepatic abscess. He has an appt with Dr Epifanio ID clinic for 08/20/2024.   Denies fevers/chills, nausea/vomiting, diarrhea/constipation, malaise/weakness.  He continues walking.  No dyspnea, chest pain, leg swelling.  Still with poor appetite.[name] Young   Hospitalization earlier this year for recurrent perihepatic abscess, initially treated with IV abx.  Hospital course complicated by AKI, enterococcus faecalis bacteremia, hypotension and acute blood loss anemia s/p 3u pRBC transfusion. TEE 03/12/2024 without signs of endocarditis.    Subsequent hospitalization from SNF for rectal bleeding, again s/p 2u pRBC. Eliquis  discontinued. CT angio 03/22/2024 did not identify source of bleed. Remains off eliquis . Oral iron stopped due to constipation.  Subsequently spironolactone  and lisinopril  stopped due to hypotension.  Continued metoprolol  12.5mg  bid and torsemide  20mg  MWF.      Relevant past medical, surgical, family and social history reviewed and updated as indicated. Interim medical history since our last visit  reviewed. Allergies and medications reviewed and updated. Outpatient Medications Prior to Visit  Medication Sig Dispense Refill   amoxicillin -clavulanate (AUGMENTIN ) 875-125 MG tablet Take 1 tablet by mouth 2 (two) times daily. For 7 days 14 tablet 0   aspirin  EC 81 MG tablet Take 81 mg by mouth daily. Swallow whole.     bisacodyl  (DULCOLAX) 5 MG EC tablet Take 10 mg by mouth at bedtime.     Cholecalciferol  (VITAMIN D3) 25 MCG (1000 UT) CAPS Take 1 capsule (1,000 Units total) by mouth daily. 30 capsule    Cyanocobalamin  (B-12) 1000 MCG CAPS Take 1 capsule by mouth every Monday, Wednesday, and Friday.     feeding supplement (ENSURE ENLIVE / ENSURE PLUS) LIQD Take 237 mLs by mouth 2 (two) times daily between meals. (Patient taking differently: Take 237 mLs by mouth as needed.) 14220 mL 0   glucose blood (TRUE METRIX BLOOD GLUCOSE TEST) test strip Use as instructed to check blood sugar once a day 100 each 3   Magnesium  Oxide -Mg Supplement 250 MG TABS Take 1 tablet by mouth daily.     metoprolol  tartrate (LOPRESSOR ) 25 MG tablet Take 0.5 tablets (12.5 mg total) by mouth 2 (two) times daily. 90 tablet 3   mirtazapine  (REMERON ) 30 MG tablet Take 1 tablet (30 mg total) by mouth at bedtime. 90 tablet 1   Multiple Vitamins-Minerals (PRESERVISION AREDS 2 PO) Take 1 capsule by mouth daily.     Nystatin  (GERHARDT'S BUTT CREAM) CREA Apply 1 Application topically 2 (two) times daily. 1  each 0   polyethylene glycol powder (GLYCOLAX /MIRALAX ) 17 GM/SCOOP powder Take 8.5-17 g by mouth daily as needed for moderate constipation.     rosuvastatin  (CRESTOR ) 10 MG tablet TAKE 1 TABLET BY MOUTH EVERY DAY 90 tablet 2   tamsulosin  (FLOMAX ) 0.4 MG CAPS capsule Take 1 capsule (0.4 mg total) by mouth daily. 90 capsule 4   torsemide  (DEMADEX ) 20 MG tablet Take 1 tablet (20 mg total) by mouth every other day.     No facility-administered medications prior to visit.     Per HPI unless specifically indicated in ROS section  below Review of Systems  Objective:  BP 120/70 (Cuff Size: Normal)   Pulse 73   Temp 97.9 F (36.6 C) (Oral)   Ht 5' 10 (1.778 m)   Wt 230 lb 9.6 oz (104.6 kg)   SpO2 98%   BMI 33.09 kg/m   Wt Readings from Last 3 Encounters:  08/18/24 230 lb 9.6 oz (104.6 kg)  08/15/24 232 lb 9.6 oz (105.5 kg)  06/25/24 217 lb 2 oz (98.5 kg)      Physical Exam Vitals and nursing note reviewed.  Constitutional:      Appearance: Normal appearance. He is not ill-appearing.  HENT:     Head: Normocephalic and atraumatic.     Mouth/Throat:     Mouth: Mucous membranes are moist.     Pharynx: Oropharynx is clear. No oropharyngeal exudate or posterior oropharyngeal erythema.  Eyes:     Extraocular Movements: Extraocular movements intact.     Pupils: Pupils are equal, round, and reactive to light.  Cardiovascular:     Rate and Rhythm: Normal rate and regular rhythm.     Pulses: Normal pulses.     Heart sounds: Murmur (3/6 systolic USB) heard.  Pulmonary:     Effort: Pulmonary effort is normal. No respiratory distress.     Breath sounds: Normal breath sounds. No wheezing, rhonchi or rales.     Comments: Lungs overall clear, mild crackles bibasilarly  Abdominal:     General: There is no distension.     Palpations: Abdomen is soft. There is no mass.     Tenderness: There is no abdominal tenderness. There is no right CVA tenderness, left CVA tenderness, guarding or rebound.     Hernia: No hernia is present.  Musculoskeletal:     Cervical back: Normal range of motion and neck supple.     Right lower leg: No edema.     Left lower leg: No edema.  Skin:    General: Skin is warm and dry.     Findings: No rash.  Neurological:     Mental Status: He is alert.  Psychiatric:        Mood and Affect: Mood normal.        Behavior: Behavior normal.       Results for orders placed or performed during the hospital encounter of 08/15/24  Comprehensive metabolic panel with GFR   Collection Time:  08/15/24 11:01 AM  Result Value Ref Range   Sodium 142 135 - 145 mmol/L   Potassium 5.1 3.5 - 5.1 mmol/L   Chloride 102 98 - 111 mmol/L   CO2 28 22 - 32 mmol/L   Glucose, Bld 108 (H) 70 - 99 mg/dL   BUN 27 (H) 8 - 23 mg/dL   Creatinine, Ser 8.91 0.61 - 1.24 mg/dL   Calcium  9.5 8.9 - 10.3 mg/dL   Total Protein 7.9 6.5 - 8.1 g/dL   Albumin   3.7 3.5 - 5.0 g/dL   AST 28 15 - 41 U/L   ALT 25 0 - 44 U/L   Alkaline Phosphatase 74 38 - 126 U/L   Total Bilirubin 0.4 0.0 - 1.2 mg/dL   GFR, Estimated >39 >39 mL/min   Anion gap 12 5 - 15  CBC with Differential/Platelet   Collection Time: 08/15/24 11:01 AM  Result Value Ref Range   WBC 6.9 4.0 - 10.5 K/uL   RBC 3.60 (L) 4.22 - 5.81 MIL/uL   Hemoglobin 10.1 (L) 13.0 - 17.0 g/dL   HCT 66.7 (L) 60.9 - 47.9 %   MCV 92.2 80.0 - 100.0 fL   MCH 28.1 26.0 - 34.0 pg   MCHC 30.4 30.0 - 36.0 g/dL   RDW 84.8 88.4 - 84.4 %   Platelets 306 150 - 400 K/uL   nRBC 0.0 0.0 - 0.2 %   Neutrophils Relative % 65 %   Neutro Abs 4.5 1.7 - 7.7 K/uL   Lymphocytes Relative 23 %   Lymphs Abs 1.6 0.7 - 4.0 K/uL   Monocytes Relative 8 %   Monocytes Absolute 0.5 0.1 - 1.0 K/uL   Eosinophils Relative 3 %   Eosinophils Absolute 0.2 0.0 - 0.5 K/uL   Basophils Relative 0 %   Basophils Absolute 0.0 0.0 - 0.1 K/uL   Immature Granulocytes 1 %   Abs Immature Granulocytes 0.04 0.00 - 0.07 K/uL    Assessment & Plan:   Problem List Items Addressed This Visit   None    No orders of the defined types were placed in this encounter.   No orders of the defined types were placed in this encounter.   There are no Patient Instructions on file for this visit.  Follow up plan: No follow-ups on file.  Anton Blas, MD

## 2024-08-18 NOTE — Patient Instructions (Addendum)
 Contact [facility] scheduling for CT scan appointment. Continue augmentin  antibiotic.  Keep ID appointment on Wednesday  CT will help determine next steps. May continue warm compresses to area 3 times a day.

## 2024-08-19 ENCOUNTER — Ambulatory Visit
Admission: RE | Admit: 2024-08-19 | Discharge: 2024-08-19 | Disposition: A | Source: Ambulatory Visit | Attending: Infectious Diseases | Admitting: Infectious Diseases

## 2024-08-19 ENCOUNTER — Encounter: Payer: Self-pay | Admitting: Family Medicine

## 2024-08-19 DIAGNOSIS — K750 Abscess of liver: Secondary | ICD-10-CM

## 2024-08-19 DIAGNOSIS — L02211 Cutaneous abscess of abdominal wall: Secondary | ICD-10-CM | POA: Insufficient documentation

## 2024-08-19 DIAGNOSIS — N281 Cyst of kidney, acquired: Secondary | ICD-10-CM | POA: Diagnosis not present

## 2024-08-19 DIAGNOSIS — K4020 Bilateral inguinal hernia, without obstruction or gangrene, not specified as recurrent: Secondary | ICD-10-CM | POA: Diagnosis not present

## 2024-08-19 DIAGNOSIS — K5730 Diverticulosis of large intestine without perforation or abscess without bleeding: Secondary | ICD-10-CM | POA: Diagnosis not present

## 2024-08-19 MED ORDER — IOHEXOL 300 MG/ML  SOLN
100.0000 mL | Freq: Once | INTRAMUSCULAR | Status: AC | PRN
  Administered 2024-08-19: 100 mL via INTRAVENOUS

## 2024-08-19 NOTE — Assessment & Plan Note (Signed)
 Lesion feels very superficial - hopeful for just cutaneous skin abscess - supportive measures reviewed including warm compresses, finish antibiotics.  However in h/o perihepatic abscess earlier in the year with significant complications, agree on CT to further evaluate, r/o recurrent perihepatic abscess.  Rec he keep ID appt later this week.  CT scan pending, already ordered by Dr Epifanio.

## 2024-08-19 NOTE — Assessment & Plan Note (Signed)
 H/o this, s/p aspiration by IR followed by drain placement, fully removed 03/2024.

## 2024-08-21 ENCOUNTER — Ambulatory Visit (INDEPENDENT_AMBULATORY_CARE_PROVIDER_SITE_OTHER): Admitting: Podiatry

## 2024-08-21 ENCOUNTER — Encounter: Payer: Self-pay | Admitting: Podiatry

## 2024-08-21 DIAGNOSIS — Z0189 Encounter for other specified special examinations: Secondary | ICD-10-CM

## 2024-08-21 DIAGNOSIS — M79674 Pain in right toe(s): Secondary | ICD-10-CM | POA: Diagnosis not present

## 2024-08-21 DIAGNOSIS — B353 Tinea pedis: Secondary | ICD-10-CM | POA: Diagnosis not present

## 2024-08-21 DIAGNOSIS — M79675 Pain in left toe(s): Secondary | ICD-10-CM

## 2024-08-21 DIAGNOSIS — E1142 Type 2 diabetes mellitus with diabetic polyneuropathy: Secondary | ICD-10-CM

## 2024-08-21 DIAGNOSIS — B351 Tinea unguium: Secondary | ICD-10-CM

## 2024-08-21 DIAGNOSIS — E119 Type 2 diabetes mellitus without complications: Secondary | ICD-10-CM | POA: Diagnosis not present

## 2024-08-21 MED ORDER — KETOCONAZOLE 2 % EX CREA: TOPICAL_CREAM | CUTANEOUS | 1 refills | Status: DC

## 2024-08-23 ENCOUNTER — Other Ambulatory Visit: Payer: Self-pay | Admitting: Family Medicine

## 2024-08-23 NOTE — Telephone Encounter (Signed)
 ERx remeron 

## 2024-08-24 ENCOUNTER — Other Ambulatory Visit: Payer: Self-pay | Admitting: Family Medicine

## 2024-08-24 DIAGNOSIS — N400 Enlarged prostate without lower urinary tract symptoms: Secondary | ICD-10-CM

## 2024-08-25 ENCOUNTER — Ambulatory Visit: Payer: Self-pay | Admitting: Surgery

## 2024-08-25 NOTE — H&P (Signed)
 Subjective:   CC: Abscess of flank [L02.217]  HPI:  referred by Alm Dover Fitzgeral* for evaluation of above.   History of Present Illness [name] Young is an 83 year old male with prior liver abscess treated with drain placement who presents with a localized skin infection at the previous drain site.  A skin lesion has been present for about two weeks at the prior drain site. It was initially indurated and became tender about one week before purulent drainage began.  He noticed thick purulent drainage after significant physical jostling during a bear hunting trip. He manually expressed pus repeatedly. The lesion has not been painful for the past three days.  He has been cleaning the area with alcohol and topical antibiotic ointment and completed a one-week course of antibiotics with improvement. Since his CT scan, drainage has been minimal on the bandages, and he is unsure if any material remains.    Past Medical History:  has a past medical history of Allergy, Diabetes mellitus without complication (CMS/HHS-HCC), GERD (gastroesophageal reflux disease), Hyperlipidemia, and Hypertension.  Past Surgical History:  has no past surgical history on file.  Family History: family history is not on file.  Social History:  reports that he has never smoked. He has quit using smokeless tobacco.  His smokeless tobacco use included chew. He reports that he does not drink alcohol and does not use drugs.  Current Medications: has a current medication list which includes the following prescription(s): aspirin , cholecalciferol , cyanocobalamin , magnesium , metoprolol  tartrate, mirtazapine , rosuvastatin , tamsulosin , torsemide , lisinopril , and polyethylene glycol.  Allergies:  No Known Allergies  ROS:  A 15 point review of systems was performed and pertinent positives and negatives noted in HPI   Objective:     BP 116/67   Pulse 67   Ht 177.8 cm (5' 10)   Wt (!) 104.3 kg (230 lb)   BMI  33.00 kg/m   Constitutional :  No distress, cooperative, alert  Lymphatics/Throat:  Supple with no lymphadenopathy  Respiratory:  Clear to auscultation bilaterally  Cardiovascular:  Regular rate and rhythm  Gastrointestinal: Soft, non-tender, non-distended, no organomegaly.  Musculoskeletal: Steady gait and movement  Skin: Cool and moist, healed drain site with no palpable induration, TTP, or surrounding erythema  Psychiatric: Normal affect, non-agitated, not confused         LABS:  N/a   RADS: CLINICAL DATA:  Right flank pain. Prior abdominal wall abscess and  drainage.   EXAM:  CT ABDOMEN AND PELVIS WITH CONTRAST   TECHNIQUE:  Multidetector CT imaging of the abdomen and pelvis was performed  using the standard protocol following bolus administration of  intravenous contrast.   RADIATION DOSE REDUCTION: This exam was performed according to the  departmental dose-optimization program which includes automated  exposure control, adjustment of the mA and/or kV according to  patient size and/or use of iterative reconstruction technique.   CONTRAST:  OMNIPAQUE  IOHEXOL  300 MG/ML  SOLN   COMPARISON:  CT abdomen pelvis dated 04/01/2024.   FINDINGS:  Lower chest: Small loculated right pleural effusion with right lung  base pleural thickening and atelectasis.   No intra-abdominal free air.  Small perihepatic ascites.   Hepatobiliary: The liver is unremarkable. Mild biliary dilatation,  post cholecystectomy.   Pancreas: Unremarkable. No pancreatic ductal dilatation or  surrounding inflammatory changes.   Spleen: Normal in size without focal abnormality.   Adrenals/Urinary Tract: The adrenal glands unremarkable. There is no  hydronephrosis on either side. Bilateral renal cysts. There is  symmetric  enhancement and excretion of contrast by both kidneys. The  visualized ureters and urinary bladder appear unremarkable.   Stomach/Bowel: There is severe sigmoid  diverticulosis and scattered  colonic diverticula. There is no bowel obstruction or active  inflammation. The appendix is not visualized likely surgically  absent.   Vascular/Lymphatic: Mild aortoiliac atherosclerotic disease. The IVC  is unremarkable. No portal venous gas. There is no adenopathy.   Reproductive: The prostate and seminal vesicles are grossly  remarkable   Other: Small fat containing bilateral inguinal hernias.   Musculoskeletal: There is a 4.0 x 2.2 cm loculated abdominal wall  collection with peripheral enhancement and surrounding inflammation  in the right posterior flank superficial to the abdominal wall  musculature consistent with an abscess. There is osteopenia with  degenerative changes of the spine. No acute osseous pathology.   IMPRESSION:  1. Interval development of an abdominal wall abscess in the right  posterior flank.  2. Severe sigmoid diverticulosis. No bowel obstruction.  3. Small loculated right pleural effusion with right lung base  pleural thickening and atelectasis.  4.  Aortic Atherosclerosis (ICD10-I70.0).    Electronically Signed    By: Vanetta Chou M.D.    On: 08/19/2024 16:12   Assessment:      Abscess of flank [L02.217] Clinical exam not concerning and symptoms seem to be improving, but concerns still exist abscess maybe too deep to appreciate on bedside.  With prolonged issues re: this area and infections, recommended formal exploration in OR to ensure no residual infection.  Plan:     1. Abscess of flank [L02.217] Discussed surgical excision.  Alternatives include continued observation.  Benefits include possible symptom relief, pathologic evaluation, improved cosmesis. Discussed the risk of surgery including recurrence, chronic pain, post-op infxn, poor cosmesis, poor/delayed wound healing, and possible re-operation to address said risks. The risks of general anesthetic, if used, includes MI, CVA, sudden death or even  reaction to anesthetic medications also discussed.  Typical post-op recovery time of 3-5 days with possible activity restrictions were also discussed.  The patient verbalized understanding and all questions were answered to the patient's satisfaction.  2. Patient has elected to proceed with surgical treatment. Procedure will be scheduled.  labs/images/medications/previous chart entries reviewed personally and relevant changes/updates noted above.

## 2024-08-25 NOTE — H&P (View-Only) (Signed)
 Subjective:   CC: Abscess of flank [L02.217]  HPI:  referred by Alm Dover Fitzgeral* for evaluation of above.   History of Present Illness [name] Young is an 83 year old male with prior liver abscess treated with drain placement who presents with a localized skin infection at the previous drain site.  A skin lesion has been present for about two weeks at the prior drain site. It was initially indurated and became tender about one week before purulent drainage began.  He noticed thick purulent drainage after significant physical jostling during a bear hunting trip. He manually expressed pus repeatedly. The lesion has not been painful for the past three days.  He has been cleaning the area with alcohol and topical antibiotic ointment and completed a one-week course of antibiotics with improvement. Since his CT scan, drainage has been minimal on the bandages, and he is unsure if any material remains.    Past Medical History:  has a past medical history of Allergy, Diabetes mellitus without complication (CMS/HHS-HCC), GERD (gastroesophageal reflux disease), Hyperlipidemia, and Hypertension.  Past Surgical History:  has no past surgical history on file.  Family History: family history is not on file.  Social History:  reports that he has never smoked. He has quit using smokeless tobacco.  His smokeless tobacco use included chew. He reports that he does not drink alcohol and does not use drugs.  Current Medications: has a current medication list which includes the following prescription(s): aspirin , cholecalciferol , cyanocobalamin , magnesium , metoprolol  tartrate, mirtazapine , rosuvastatin , tamsulosin , torsemide , lisinopril , and polyethylene glycol.  Allergies:  No Known Allergies  ROS:  A 15 point review of systems was performed and pertinent positives and negatives noted in HPI   Objective:     BP 116/67   Pulse 67   Ht 177.8 cm (5' 10)   Wt (!) 104.3 kg (230 lb)   BMI  33.00 kg/m   Constitutional :  No distress, cooperative, alert  Lymphatics/Throat:  Supple with no lymphadenopathy  Respiratory:  Clear to auscultation bilaterally  Cardiovascular:  Regular rate and rhythm  Gastrointestinal: Soft, non-tender, non-distended, no organomegaly.  Musculoskeletal: Steady gait and movement  Skin: Cool and moist, healed drain site with no palpable induration, TTP, or surrounding erythema  Psychiatric: Normal affect, non-agitated, not confused         LABS:  N/a   RADS: CLINICAL DATA:  Right flank pain. Prior abdominal wall abscess and  drainage.   EXAM:  CT ABDOMEN AND PELVIS WITH CONTRAST   TECHNIQUE:  Multidetector CT imaging of the abdomen and pelvis was performed  using the standard protocol following bolus administration of  intravenous contrast.   RADIATION DOSE REDUCTION: This exam was performed according to the  departmental dose-optimization program which includes automated  exposure control, adjustment of the mA and/or kV according to  patient size and/or use of iterative reconstruction technique.   CONTRAST:  OMNIPAQUE  IOHEXOL  300 MG/ML  SOLN   COMPARISON:  CT abdomen pelvis dated 04/01/2024.   FINDINGS:  Lower chest: Small loculated right pleural effusion with right lung  base pleural thickening and atelectasis.   No intra-abdominal free air.  Small perihepatic ascites.   Hepatobiliary: The liver is unremarkable. Mild biliary dilatation,  post cholecystectomy.   Pancreas: Unremarkable. No pancreatic ductal dilatation or  surrounding inflammatory changes.   Spleen: Normal in size without focal abnormality.   Adrenals/Urinary Tract: The adrenal glands unremarkable. There is no  hydronephrosis on either side. Bilateral renal cysts. There is  symmetric  enhancement and excretion of contrast by both kidneys. The  visualized ureters and urinary bladder appear unremarkable.   Stomach/Bowel: There is severe sigmoid  diverticulosis and scattered  colonic diverticula. There is no bowel obstruction or active  inflammation. The appendix is not visualized likely surgically  absent.   Vascular/Lymphatic: Mild aortoiliac atherosclerotic disease. The IVC  is unremarkable. No portal venous gas. There is no adenopathy.   Reproductive: The prostate and seminal vesicles are grossly  remarkable   Other: Small fat containing bilateral inguinal hernias.   Musculoskeletal: There is a 4.0 x 2.2 cm loculated abdominal wall  collection with peripheral enhancement and surrounding inflammation  in the right posterior flank superficial to the abdominal wall  musculature consistent with an abscess. There is osteopenia with  degenerative changes of the spine. No acute osseous pathology.   IMPRESSION:  1. Interval development of an abdominal wall abscess in the right  posterior flank.  2. Severe sigmoid diverticulosis. No bowel obstruction.  3. Small loculated right pleural effusion with right lung base  pleural thickening and atelectasis.  4.  Aortic Atherosclerosis (ICD10-I70.0).    Electronically Signed    By: Vanetta Chou M.D.    On: 08/19/2024 16:12   Assessment:      Abscess of flank [L02.217] Clinical exam not concerning and symptoms seem to be improving, but concerns still exist abscess maybe too deep to appreciate on bedside.  With prolonged issues re: this area and infections, recommended formal exploration in OR to ensure no residual infection.  Plan:     1. Abscess of flank [L02.217] Discussed surgical excision.  Alternatives include continued observation.  Benefits include possible symptom relief, pathologic evaluation, improved cosmesis. Discussed the risk of surgery including recurrence, chronic pain, post-op infxn, poor cosmesis, poor/delayed wound healing, and possible re-operation to address said risks. The risks of general anesthetic, if used, includes MI, CVA, sudden death or even  reaction to anesthetic medications also discussed.  Typical post-op recovery time of 3-5 days with possible activity restrictions were also discussed.  The patient verbalized understanding and all questions were answered to the patient's satisfaction.  2. Patient has elected to proceed with surgical treatment. Procedure will be scheduled.  labs/images/medications/previous chart entries reviewed personally and relevant changes/updates noted above.

## 2024-08-25 NOTE — Progress Notes (Signed)
 Subjective:   CC: Abscess of flank [L02.217]  HPI:  referred by Alm Dover Fitzgeral* for evaluation of above.   History of Present Illness [name] Young is an 83 year old male with prior liver abscess treated with drain placement who presents with a localized skin infection at the previous drain site.  A skin lesion has been present for about two weeks at the prior drain site. It was initially indurated and became tender about one week before purulent drainage began.  He noticed thick purulent drainage after significant physical jostling during a bear hunting trip. He manually expressed pus repeatedly. The lesion has not been painful for the past three days.  He has been cleaning the area with alcohol and topical antibiotic ointment and completed a one-week course of antibiotics with improvement. Since his CT scan, drainage has been minimal on the bandages, and he is unsure if any material remains.    Past Medical History:  has a past medical history of Allergy, Diabetes mellitus without complication (CMS/HHS-HCC), GERD (gastroesophageal reflux disease), Hyperlipidemia, and Hypertension.  Past Surgical History:  has no past surgical history on file.  Family History: family history is not on file.  Social History:  reports that he has never smoked. He has quit using smokeless tobacco.  His smokeless tobacco use included chew. He reports that he does not drink alcohol and does not use drugs.  Current Medications: has a current medication list which includes the following prescription(s): aspirin , cholecalciferol , cyanocobalamin , magnesium , metoprolol  tartrate, mirtazapine , rosuvastatin , tamsulosin , torsemide , lisinopril , and polyethylene glycol.  Allergies:  No Known Allergies  ROS:  A 15 point review of systems was performed and pertinent positives and negatives noted in HPI   Objective:     BP 116/67   Pulse 67   Ht 177.8 cm (5' 10)   Wt (!) 104.3 kg (230 lb)   BMI  33.00 kg/m   Constitutional :  No distress, cooperative, alert  Lymphatics/Throat:  Supple with no lymphadenopathy  Respiratory:  Clear to auscultation bilaterally  Cardiovascular:  Regular rate and rhythm  Gastrointestinal: Soft, non-tender, non-distended, no organomegaly.  Musculoskeletal: Steady gait and movement  Skin: Cool and moist, healed drain site with no palpable induration, TTP, or surrounding erythema  Psychiatric: Normal affect, non-agitated, not confused         LABS:  N/a   RADS: CLINICAL DATA:  Right flank pain. Prior abdominal wall abscess and  drainage.   EXAM:  CT ABDOMEN AND PELVIS WITH CONTRAST   TECHNIQUE:  Multidetector CT imaging of the abdomen and pelvis was performed  using the standard protocol following bolus administration of  intravenous contrast.   RADIATION DOSE REDUCTION: This exam was performed according to the  departmental dose-optimization program which includes automated  exposure control, adjustment of the mA and/or kV according to  patient size and/or use of iterative reconstruction technique.   CONTRAST:  OMNIPAQUE  IOHEXOL  300 MG/ML  SOLN   COMPARISON:  CT abdomen pelvis dated 04/01/2024.   FINDINGS:  Lower chest: Small loculated right pleural effusion with right lung  base pleural thickening and atelectasis.   No intra-abdominal free air.  Small perihepatic ascites.   Hepatobiliary: The liver is unremarkable. Mild biliary dilatation,  post cholecystectomy.   Pancreas: Unremarkable. No pancreatic ductal dilatation or  surrounding inflammatory changes.   Spleen: Normal in size without focal abnormality.   Adrenals/Urinary Tract: The adrenal glands unremarkable. There is no  hydronephrosis on either side. Bilateral renal cysts. There is  symmetric  enhancement and excretion of contrast by both kidneys. The  visualized ureters and urinary bladder appear unremarkable.   Stomach/Bowel: There is severe sigmoid  diverticulosis and scattered  colonic diverticula. There is no bowel obstruction or active  inflammation. The appendix is not visualized likely surgically  absent.   Vascular/Lymphatic: Mild aortoiliac atherosclerotic disease. The IVC  is unremarkable. No portal venous gas. There is no adenopathy.   Reproductive: The prostate and seminal vesicles are grossly  remarkable   Other: Small fat containing bilateral inguinal hernias.   Musculoskeletal: There is a 4.0 x 2.2 cm loculated abdominal wall  collection with peripheral enhancement and surrounding inflammation  in the right posterior flank superficial to the abdominal wall  musculature consistent with an abscess. There is osteopenia with  degenerative changes of the spine. No acute osseous pathology.   IMPRESSION:  1. Interval development of an abdominal wall abscess in the right  posterior flank.  2. Severe sigmoid diverticulosis. No bowel obstruction.  3. Small loculated right pleural effusion with right lung base  pleural thickening and atelectasis.  4.  Aortic Atherosclerosis (ICD10-I70.0).    Electronically Signed    By: Vanetta Chou M.D.    On: 08/19/2024 16:12   Assessment:      Abscess of flank [L02.217] Clinical exam not concerning and symptoms seem to be improving, but concerns still exist abscess maybe too deep to appreciate on bedside.  With prolonged issues re: this area and infections, recommended formal exploration in OR to ensure no residual infection.  Plan:     1. Abscess of flank [L02.217] Discussed surgical excision.  Alternatives include continued observation.  Benefits include possible symptom relief, pathologic evaluation, improved cosmesis. Discussed the risk of surgery including recurrence, chronic pain, post-op infxn, poor cosmesis, poor/delayed wound healing, and possible re-operation to address said risks. The risks of general anesthetic, if used, includes MI, CVA, sudden death or even  reaction to anesthetic medications also discussed.  Typical post-op recovery time of 3-5 days with possible activity restrictions were also discussed.  The patient verbalized understanding and all questions were answered to the patient's satisfaction.  2. Patient has elected to proceed with surgical treatment. Procedure will be scheduled.  labs/images/medications/previous chart entries reviewed personally and relevant changes/updates noted above.

## 2024-08-27 ENCOUNTER — Other Ambulatory Visit: Payer: Self-pay

## 2024-08-27 ENCOUNTER — Telehealth (HOSPITAL_BASED_OUTPATIENT_CLINIC_OR_DEPARTMENT_OTHER): Payer: Self-pay | Admitting: *Deleted

## 2024-08-27 ENCOUNTER — Encounter
Admission: RE | Admit: 2024-08-27 | Discharge: 2024-08-27 | Disposition: A | Discharge status: Home / Self Care | Source: Ambulatory Visit | Attending: Surgery | Admitting: Surgery

## 2024-08-27 DIAGNOSIS — L02211 Cutaneous abscess of abdominal wall: Secondary | ICD-10-CM

## 2024-08-27 DIAGNOSIS — E1169 Type 2 diabetes mellitus with other specified complication: Secondary | ICD-10-CM

## 2024-08-27 HISTORY — DX: Anemia, unspecified: D64.9

## 2024-08-27 NOTE — Telephone Encounter (Signed)
-----   Message from Dorise Pereyra, NP sent at 08/27/2024  9:15 AM EST ----- Regarding: Request for pre-operative cardiac clearance Request for pre-operative cardiac clearance:  1. What type of surgery is being performed?  INCISION AND DRAINAGE, ABSCESS, TORSO  2. When is this surgery scheduled?  09/02/2024  3. Type of clearance being requested (medical, pharmacy, both)? BOTH   4. Are there any medications that need to be held prior to surgery? ASA x 7 days has been requested by surgery  5. Practice name and name of physician performing surgery?  Performing surgeon: Dr. Henriette Pierre, DO Requesting clearance: Dorise Pereyra, FNP-C    6. Anesthesia type (none, local, MAC, general)? GENERAL  7. What is the office phone and fax number?   Phone: [phone] Fax: Return FAX not required. I will follow up in [facility].  ATTENTION: Unable to create telephone message as per your standard workflow. Directed by [facility] providers to send requests for cardiac clearance to this pool for appropriate distribution to provider covering pre-operative clearances.   Dorise Pereyra, MSN, APRN, FNP-C, CEN [facility]  Peri-operative Services Nurse Practitioner Phone: [phone] 08/27/2024 9:15 AM

## 2024-08-27 NOTE — Telephone Encounter (Signed)
° °  Pre-operative Risk Assessment    Patient Name: [name] Young.  DOB: [birth year] MRN: [id number]   Date of last office visit: 08/15/24 DR. END Date of next office visit: 11/19/24 DR. END   Request for Surgical Clearance    Procedure:  INCISION AND DRAINAGE, ABSCESS, TORSO  Date of Surgery:  Clearance 09/02/24                                Surgeon:  DR. TYE Surgeon's Group or Practice Name:  [facility] Phone number:  [phone] Fax number:  Return FAX not required. I will follow up in [facility].    Type of Clearance Requested:   - Medical  - Pharmacy:  Hold Aspirin  x 7 DAYS PRIOR   Type of Anesthesia:  General    Additional requests/questions:    Bonney Niels Jest   08/27/2024, 9:33 AM

## 2024-08-27 NOTE — Telephone Encounter (Addendum)
° °  Patient Name: [name] Young.  DOB: [birth year] MRN: [id number]  Primary Cardiologist: Lonni Hanson, MD  Chart reviewed as part of pre-operative protocol coverage.  Patient recently seen in clinic 08/15/24 without acute cardiac concerns, but with concern for flank abscess. Reached out to Dr. Gollan given Dr. Hanson is out of the office this week.   Dr. Gollan reviewed the case and does not recommend holding aspirin . He recommends to continue during the perioperative period. Reached out to preadmissions nurse who looped in surgeon Dr. Tye who confirmed they are OK to proceed on aspirin . Zebedee Rams RN called the patient to let them know. I also spoke with wife who was present with patient. They confirm that patient has not had any new changes in his cardiovascular health since OV 08/15/24. Therefore, based on ACC/AHA guidelines, the patient would be at acceptable risk for the planned procedure without further cardiovascular testing. The patient was advised that if he develops new symptoms prior to surgery to contact our office to arrange for a follow-up visit, and he verbalized understanding.  Will route this bundled recommendation to requesting provider via Epic fax function. Please call with questions.  Marvene Strohm N Julene Rahn, PA-C 08/27/2024, 9:56 AM

## 2024-08-27 NOTE — Patient Instructions (Addendum)
 Your procedure is scheduled on: 09/02/24 - Tuesday Report to the Registration Desk on the 1st floor of the [facility]. To find out your arrival time, please call [phone] between 1PM - 3PM on: 09/01/24 - Monday If your arrival time is 6:00 am, do not arrive before that time as the [facility] entrance doors do not open until 6:00 am.  REMEMBER: Instructions that are not followed completely may result in serious medical risk, up to and including death; or upon the discretion of your surgeon and anesthesiologist your surgery may need to be rescheduled.  Do not eat food after midnight the night before surgery.  No gum chewing or hard candies.  You may however, drink CLEAR liquids up to 2 hours before you are scheduled to arrive for your surgery. Do not drink anything within 2 hours of your scheduled arrival time.  Clear liquids include: - water   One week prior to surgery: Stop Anti-inflammatories (NSAIDS) such as Advil , Aleve, Ibuprofen , Motrin , Naproxen, Naprosyn and Aspirin  based products such as Excedrin, Goody's Powder, BC Powder. You may take Tylenol  if needed for pain up until the day of surgery.  Continue taking your aspirin  81 mg daily.  Stop ANY OVER THE COUNTER supplements until after surgery.   ON THE DAY OF SURGERY ONLY TAKE THESE MEDICATIONS WITH SIPS OF WATER:  metoprolol  tartrate  Aspirin  81 mg    No Alcohol for 24 hours before or after surgery.  No Smoking including e-cigarettes for 24 hours before surgery.  No chewable tobacco products for at least 6 hours before surgery.  No nicotine patches on the day of surgery.  Do not use any recreational drugs for at least a week (preferably 2 weeks) before your surgery.  Please be advised that the combination of cocaine and anesthesia may have negative outcomes, up to and including death. If you test positive for cocaine, your surgery will be cancelled.  On the morning of surgery brush your teeth with  toothpaste and water, you may rinse your mouth with mouthwash if you wish. Do not swallow any toothpaste or mouthwash.  Use CHG Soap or antibacterial soap as directed on instruction sheet.  Do not wear jewelry, make-up, hairpins, clips or nail polish.  For welded (permanent) jewelry: bracelets, anklets, waist bands, etc.  Please have this removed prior to surgery.  If it is not removed, there is a chance that hospital personnel will need to cut it off on the day of surgery.  Do not wear lotions, powders, or perfumes.   Do not shave body hair from the neck down 48 hours before surgery.  Contact lenses, hearing aids and dentures may not be worn into surgery.  Do not bring valuables to the hospital. [facility] is not responsible for any missing/lost belongings or valuables.   Notify your doctor if there is any change in your medical condition (cold, fever, infection).  Wear comfortable clothing (specific to your surgery type) to the hospital.  After surgery, you can help prevent lung complications by doing breathing exercises.  Take deep breaths and cough every 1-2 hours. Your doctor may order a device called an Incentive Spirometer to help you take deep breaths.  When coughing or sneezing, hold a pillow firmly against your incision with both hands. This is called splinting. Doing this helps protect your incision. It also decreases belly discomfort.  If you are being admitted to the hospital overnight, leave your suitcase in the car. After surgery it may be brought  to your room.  In case of increased patient census, it may be necessary for you, the patient, to continue your postoperative care in the Same Day Surgery department.  If you are being discharged the day of surgery, you will not be allowed to drive home. You will need a responsible individual to drive you home and stay with you for 24 hours after surgery.   If you are taking public transportation, you will need to have a  responsible individual with you.  Please call the Pre-admissions Testing Dept. at [phone] if you have any questions about these instructions.  Surgery Visitation Policy:  Patients having surgery or a procedure may have two visitors.  Children under the age of 79 must have an adult with them who is not the patient.  Inpatient Visitation:    Visiting hours are 7 a.m. to 8 p.m. Up to four visitors are allowed at one time in a patient room. The visitors may rotate out with other people during the day.  One visitor age 66 or older may stay with the patient overnight and must be in the room by 8 p.m.   Merchandiser, Retail to address health-related social needs:  [email or web address]                                                                                                            Preparing for Surgery with CHLORHEXIDINE  GLUCONATE (CHG) Soap - you may pick up at [address] [facility] [address], you may come Monday - Friday 8am to 4pm.  Chlorhexidine  Gluconate (CHG) Soap  o An antiseptic cleaner that kills germs and bonds with the skin to continue killing germs even after washing  o Used for showering the night before surgery and morning of surgery  Before surgery, you can play an important role by reducing the number of germs on your skin.  CHG (Chlorhexidine  gluconate) soap is an antiseptic cleanser which kills germs and bonds with the skin to continue killing germs even after washing.  Please do not use if you have an allergy to CHG or antibacterial soaps. If your skin becomes reddened/irritated stop using the CHG.  1. Shower the NIGHT BEFORE SURGERY with CHG soap.  2. If you choose to wash your hair, wash your hair first as usual with your normal shampoo.  3. After shampooing, rinse your hair and body thoroughly to remove the shampoo.  4. Use CHG as you would any other liquid soap. You can apply CHG directly to the skin and  wash gently with a clean washcloth.  5. Apply the CHG soap to your body only from the neck down. Do not use on open wounds or open sores. Avoid contact with your eyes, ears, mouth, and genitals (private parts). Wash face and genitals (private parts) with your normal soap.  6. Wash thoroughly, paying special attention to the area where your surgery will be performed.  7. Thoroughly rinse your body with warm water.  8. Do not shower/wash with your normal soap  after using and rinsing off the CHG soap.  9. Do not use lotions, oils, etc., after showering with CHG.  10. Pat yourself dry with a clean towel.  11. Wear clean pajamas to bed the night before surgery.  12. Place clean sheets on your bed the night of your shower and do not sleep with pets.  13. Do not apply any deodorants/lotions/powders.  14. Please wear clean clothes to the hospital.  15. Remember to brush your teeth with your regular toothpaste.

## 2024-08-28 ENCOUNTER — Encounter: Payer: Self-pay | Admitting: Surgery

## 2024-08-28 ENCOUNTER — Encounter: Payer: Self-pay | Admitting: Pharmacist

## 2024-08-28 NOTE — Progress Notes (Incomplete)
 Perioperative / Anesthesia Services  Pre-Admission Testing Clinical Review / Pre-Operative Anesthesia Consult  Date: 08/28/2024  PATIENT DEMOGRAPHICS: Name: [name] Young. DOB: 01-Aug-1941 MRN:   [id number]  Note: Available PAT nursing documentation and vital signs have been reviewed. Clinical nursing staff has updated patient's PMH/PSHx, current medication list, and drug allergies/intolerances to ensure complete and comprehensive history available to assist care teams in MDM as it pertains to the aforementioned surgical procedure and anticipated anesthetic course. Extensive review of available clinical information personally performed. Nursing documentation reviewed. [place] PMH and PSHx updated with any diagnoses and/or procedures that I have knowledge of that may have been inadvertently omitted during his intake with the pre-admission testing department's nursing staff.  PLANNED SURGICAL PROCEDURE(S):   Case: [id number] Date/Time: 09/02/24 1030   Procedure: INCISION AND DRAINAGE, ABSCESS, TORSO (Right: Flank) - Lt side down in position   Anesthesia type: General   Pre-op diagnosis: L02.217 abscess of flank   Location: [facility] OR ROOM 08 / [facility] ORS FOR ANESTHESIA [facility]   Surgeons: Tye Millet, DO        CLINICAL DISCUSSION: [name] Young. is a 83 y.o. male who is submitted for pre-surgical anesthesia review and clearance prior to him undergoing the above procedure.  Patient has never been a smoker. Pertinent PMH includes: CAD, ischemia cardiomyopathy, HFrEF, aortic stenosis, paroxysmal atrial fibrillation, NSVT, PSVT, ascending aortic dilatation, aortic atherosclerosis, endocarditis of mitral valve, RIGHT carotid artery disease, HTN, HLD, T2DM, CKD-III, dyspnea, GERD (on daily PPI), dyspnea, nephrolithiasis, BPH, OA.   Patient is followed by cardiology (End, MD). He was last seen in the cardiology clinic on ***; notes reviewed. ***At the time of his clinic visit, patient  doing well overall from a cardiovascular perspective. Patient denied any chest pain, shortness of breath, PND, orthopnea, palpitations, significant peripheral edema, weakness, fatigue, vertiginous symptoms, or presyncope/syncope. Patient with a past medical history significant for cardiovascular diagnoses. Documented physical exam was grossly benign, providing no evidence of acute exacerbation and/or decompensation of the patient's known cardiovascular conditions.  Myocardial perfusion imaging study was performed on 12/12/2017 revealing a low normal left ventricular systolic function with an EF of 51%.  There was inferolateral hypokinesis noted.  Additionally, there was a medium defect of moderate severity present in the basal inferolateral and mid inferolateral location consistent with ischemia.  There were no ST segment changes noted during stress, however frequent PVCs were noted.  Study was determined to be abnormal and intermediate risk.   Patient underwent diagnostic LEFT heart catheterization on 12/21/2017 revealing multivessel CAD; 75% mid LAD, 90% D1, 80-99% OM1/2, and complete occlusion of the proximal RCA with left-to-right collaterals.  Patient was referred to CVTS for consideration of revascularization procedure.   Patient underwent a three-vessel CABG procedure on 01/18/2018.  LIMA-LAD, SVG-diagonal, and SVG--PLB (RCA) bypass grafts were placed.   Patient developed mitral valve endocarditis in the setting of polymicrobial bacteremia associated with ascending cholangitis in 10/2021.  TEE at that time revealed a mildly reduced left ventricular systolic function with an EF of 40-45%.  There was left ventricular global hypokinesis.  No LA/LAA thrombus was detected.  Increased echogenicity of the anterior mitral valve leaflet with small mobile vegetation noted consistent with endocarditis.  There was mild mitral valve regurgitation.  Small vegetation also noted on the mitral valve.  Patient was  treated with 6 weeks of intravenous antibiotics.  *** Patient with an atrial fibrillation diagnosis; CHA2DS2-VASc Score = 6 (age x2, HFrEF, HTN, vascular disease history, T2DM).  His rate and rhythm are currently being maintained on oral metoprolol  tartrate.  He is no longer on oral anticoagulation therapy; formally on apixaban .  Blood pressure***controlled at *** mmHg on currently prescribed *** therapies.  Patient is on *** for his HLD diagnosis and ASCVD prevention. ***Patient is not diabetic. ***He does not have an OSAH diagnosis. ***FC. No changes were made to his medication regimen during his visit with cardiology.  Patient scheduled to follow-up with outpatient cardiology in***months or sooner if needed.  [name] Young. is scheduled for an elective INCISION AND DRAINAGE, ABSCESS, TORSO (Right: Flank) on 09/02/2024 with Dr. Henriette Pierre, DO. Given patient's past medical history significant for cardiovascular diagnoses, presurgical cardiac clearance was sought by the PAT team. Per cardiology, based ACC/AHA guidelines, the patient's past medical history, and the amount of time since his last clinic visit, this patient would be at an overall ACCEPTABLE risk for the planned procedure without further cardiovascular testing or intervention at this time.   In review of the patient's medication reconciliation, it is noted that he is on daily oral antithrombotic therapy. Given that patient's past medical history is significant for cardiovascular diagnoses, including but not limited to CAD, general surgery has cleared patient to continue his daily low dose ASA throughout his perioperative course.  Patient has been updated on these directives from his specialty care providers by the PAT team.  Patient denies previous perioperative complications with anesthesia in the past. In review his EMR, it is noted that patient underwent a general anesthetic course here at [facility]  (ASA III) in 03/2024 without documented complications.   MOST RECENT VITAL SIGNS:    08/18/2024   11:40 AM 08/15/2024    9:42 AM 06/25/2024   11:44 AM  Vitals with BMI  Height 5' 10 5' 10   Weight 230 lbs 10 oz 232 lbs 10 oz   BMI 33.09 33.37   Systolic 120 118 879  Diastolic 70 48 54  Pulse 73 73    PROVIDERS/SPECIALISTS: NOTE: Primary physician provider listed below. Patient may have been seen by APP or partner within same practice.   PROVIDER ROLE / SPECIALTY LAST SHERLEAN Pierre Henriette, DO General Surgery (Surgeon) 08/25/2024  Rilla Baller, MD Primary Care Provider 08/18/2024  End, Lonni, MD Cardiology 08/15/2024; preop APP call 08/28/2024   ALLERGIES: Allergies[1]  CURRENT HOME MEDICATIONS:  aspirin  EC 81 MG tablet   bisacodyl  (DULCOLAX) 5 MG EC tablet   Cholecalciferol  (VITAMIN D3) 25 MCG (1000 UT) CAPS   Cyanocobalamin  (B-12) 1000 MCG CAPS   Magnesium  Oxide -Mg Supplement 250 MG TABS   metoprolol  tartrate (LOPRESSOR ) 25 MG tablet   mirtazapine  (REMERON ) 30 MG tablet   Multiple Vitamins-Minerals (PRESERVISION AREDS 2 PO)   rosuvastatin  (CRESTOR ) 10 MG tablet   tamsulosin  (FLOMAX ) 0.4 MG CAPS capsule   torsemide  (DEMADEX ) 20 MG tablet   amoxicillin -clavulanate (AUGMENTIN ) 875-125 MG tablet   feeding supplement (ENSURE ENLIVE / ENSURE PLUS) LIQD   glucose blood (TRUE METRIX BLOOD GLUCOSE TEST) test strip   ketoconazole  (NIZORAL ) 2 % cream   Nystatin  (GERHARDT'S BUTT CREAM) CREA   polyethylene glycol powder (GLYCOLAX /MIRALAX ) 17 GM/SCOOP powder   HISTORY: Past Medical History:  Diagnosis Date   Adenomatous colon polyp    Anemia    Aortic atherosclerosis    Aortic stenosis    a. 11/2023 Echo: mild-mod AS, mean grad .   Arthritis    Ascending aorta dilatation    a.) TTE 11/23/2017:  asc Ao measured 37 mm; b.) TTE 10/23/2021: Ao root measured 41 mm, asc Ao measured 38 mm; c.) TEE 10/28/2021: asc Ao measured 38 mm; d.) TTE 01/11/2022: Ao root 40 mm, asc  Ao 39 mm   Ascending cholangitis (HCC) 10/2021   Bacteremia due to Streptococcus 10/24/2021   BPH (benign prostatic hyperplasia)    CCC (chronic calculous cholecystitis) 04/25/2022   Cholecystectomy 05/2022 - chronic cholecystitis with cholelithiasis       CKD (chronic kidney disease), stage III (HCC)    Claustrophobia    Coronary artery disease    a.) LHC 12/21/2017: 75% mLAD, 90% D1, 80-99% OM1/2, CTO pRCA (L-R collaterals) --> CVTS consult. b.) 01/2018 CABG x 3 (LIMA->LAD, VG->D1, VG->RPL); c. 01/2024 Cath: LM nl, LAD 20p, 4m, D1 70ost, 90, LCX 30ost, OM1 80, OM2 99- fills from collats from D1. RCA 100p CTO, RPAV 100 (fills via collats from 3rd septal), VG->RPDA nl, LIMA->LAD nl, VG->D1 nl. RHC PA 35/10 (8), PCWP 10, CO 6.5-->Med Rx.   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   In setting of bacteremia from ascending colangitis   GERD (gastroesophageal reflux disease)    Hemothorax on right 11/2023   HFrEF (heart failure with reduced ejection fraction) (HCC)    a.) TTE 11/23/2017: EF 50-55%, mod MAC, triv TR, G1DD; b.) TTE 10/23/2021: EF 40-45%, mild LAE, Ao sclerosis, triv MR, G2DD; c.) TEE 10/28/2021: EF 40-45%, glob HK, mobile vegitation on MV; d.) TTE 01/11/2022: EF 50-55%, mild LVH, RVE, Ao sclerosis, mild MR/AR, G1DD   History of cholelithiasis    History of kidney stones    ca ox [place] [facility]) now [place]   History of pneumonia    HLD (hyperlipidemia)    HTN (hypertension)    Ischemic cardiomyopathy    a. 12/2022 Echo: EF 50-55%; b. 11/2023 Echo: EF 40-45%, glob HK, mild LVH, GrI DD, nl RV fxn, mild MR, mild AI, mild-mod AS, Ao root 43mm.   Jaundice    age 89   On apixaban  therapy    Paroxysmal atrial fibrillation (HCC)    a.) CHA2DS2VASc = 6 (age x 2, HFrEF, HTN, vascular disease history, T2DM);  b.) rate/rhythm maintained on oral metoprolol  succinate; chronically anticoagulated with apixaban    Pleural effusion    a. 11/2023 CT chest: Large R ple  effusion, partially loculated-->s/p pleurx.   Pneumonia    Right-sided carotid artery disease    a.) carotid doppler 04/05/2022: 1-39% RICA   S/P CABG x 3    a.) LIMA-LAD, SVG-diagonal, SVG-PL branch of RCA   S/P cataract extraction and insertion of intraocular lens    T2DM (type 2 diabetes mellitus) (HCC) 2010   Past Surgical History:  Procedure Laterality Date   CARPAL TUNNEL RELEASE Bilateral    CATARACT EXTRACTION, BILATERAL     CHOLECYSTECTOMY  [id number]   COLONOSCOPY  11/2012   12 adenomatous polyps, diverticulosis, rec rpt 1 yr [facility])   COLONOSCOPY  12/2013   3 polyps, diverticulosis, rec rpt 3 yrs [facility])   COLONOSCOPY  06/2019   6 polyps (TA), diverticulosis, f/u left open ended [facility])   CORONARY ARTERY BYPASS GRAFT N/A 01/18/2018   Procedure: CORONARY ARTERY BYPASS GRAFTING (CABG) x 3; Using Left Internal Mammary Artery, and Right Greater Saphenous Vein harvested Endoscopically, Coronary Artery Endarterectomy;  Surgeon: [name] Hanford Coy, MD;  Location: [facility] OR;  Service: Open Heart Surgery;  Laterality: N/A;   ERCP N/A 10/23/2021   Procedure: ENDOSCOPIC RETROGRADE CHOLANGIOPANCREATOGRAPHY (  ERCP);  Surgeon: Aneita Gwendlyn DASEN, MD;  Location: [facility] ENDOSCOPY;  Service: Endoscopy;  Laterality: N/A;   EYE SURGERY     IR CATHETER TUBE CHANGE  01/08/2023   IR PATIENT EVAL TECH 0-60 MINS  03/07/2024   IR PERC PLEURAL DRAIN W/INDWELL CATH W/IMG GUIDE  11/20/2023   IR RADIOLOGIST EVAL & MGMT  01/23/2023   IR RADIOLOGIST EVAL & MGMT  04/01/2024   IR THORACENTESIS ASP PLEURAL SPACE W/IMG GUIDE  12/29/2022   KNEE CARTILAGE SURGERY Left    LEFT HEART CATH AND CORONARY ANGIOGRAPHY N/A 12/21/2017   Procedure: LEFT HEART CATH AND CORONARY ANGIOGRAPHY;  Surgeon: Mady Bruckner, MD;  Location: [facility] INVASIVE CV LAB;  Service: Cardiovascular;  Laterality: N/A;   LITHOTRIPSY     REMOVAL OF STONES  10/23/2021   Procedure: REMOVAL OF STONES;  Surgeon: Aneita Gwendlyn DASEN, MD;  Location: [facility]  ENDOSCOPY;  Service: Endoscopy;;   RIGHT/LEFT HEART CATH AND CORONARY/GRAFT ANGIOGRAPHY Bilateral 01/25/2024   Procedure: RIGHT/LEFT HEART CATH AND CORONARY/GRAFT ANGIOGRAPHY;  Surgeon: Mady Bruckner, MD;  Location: [facility] INVASIVE CV LAB;  Service: Cardiovascular;  Laterality: Bilateral;   SPHINCTEROTOMY  10/23/2021   Procedure: SPHINCTEROTOMY;  Surgeon: Aneita Gwendlyn DASEN, MD;  Location: [facility] ENDOSCOPY;  Service: Endoscopy;;   TEE WITHOUT CARDIOVERSION N/A 01/18/2018   Procedure: TRANSESOPHAGEAL ECHOCARDIOGRAM (TEE);  Surgeon: [name] Ochoa, Maude, MD;  Location: [facility] OR;  Service: Open Heart Surgery;  Laterality: N/A;   TEE WITHOUT CARDIOVERSION N/A 10/28/2021   Procedure: TRANSESOPHAGEAL ECHOCARDIOGRAM (TEE);  Surgeon: Mona Vinie BROCKS, MD;  Location: [facility] ENDOSCOPY;  Service: Cardiovascular;  Laterality: N/A;   TEE WITHOUT CARDIOVERSION N/A 03/12/2024   Procedure: ECHOCARDIOGRAM, TRANSESOPHAGEAL;  Surgeon: Darliss Rogue, MD;  Location: [facility] ORS;  Service: Cardiovascular;  Laterality: N/A;   UMBILICAL HERNIA REPAIR     with mesh   Family History  Problem Relation Age of Onset   Alzheimer's disease Mother    Breast cancer Mother        breast   Atrial fibrillation Mother    CAD Mother    Stroke Father 62   Diabetes Father    CAD Father    Colon polyps Sister    Colon polyps Brother    Rectal cancer Maternal Grandfather        rectal   Colon cancer Maternal Grandfather 72   Esophageal cancer Neg Hx    Stomach cancer Neg Hx    Social History   Tobacco Use   Smoking status: Never    Passive exposure: Past   Smokeless tobacco: Former    Types: Chew    Quit date: 09/08/2001  Substance Use Topics   Alcohol use: Not Currently    Comment: occ   LABS:  Hospital Outpatient Visit on 08/15/2024  Component Date Value Ref Range Status   Sodium 08/15/2024 142  135 - 145 mmol/L Final   Potassium 08/15/2024 5.1  3.5 - 5.1 mmol/L Final   Chloride 08/15/2024 102  98 - 111 mmol/L Final    CO2 08/15/2024 28  22 - 32 mmol/L Final   Glucose, Bld 08/15/2024 108 (H)  70 - 99 mg/dL Final   Glucose reference range applies only to samples taken after fasting for at least 8 hours.   BUN 08/15/2024 27 (H)  8 - 23 mg/dL Final   Creatinine, Ser 08/15/2024 1.08  0.61 - 1.24 mg/dL Final   Calcium  08/15/2024 9.5  8.9 - 10.3 mg/dL Final   Total Protein 87/94/7974 7.9  6.5 -  8.1 g/dL Final   Albumin  08/15/2024 3.7  3.5 - 5.0 g/dL Final   AST 87/94/7974 28  15 - 41 U/L Final   ALT 08/15/2024 25  0 - 44 U/L Final   Alkaline Phosphatase 08/15/2024 74  38 - 126 U/L Final   Total Bilirubin 08/15/2024 0.4  0.0 - 1.2 mg/dL Final   GFR, Estimated 08/15/2024 >60  >60 mL/min Final   Comment: (NOTE) Calculated using the CKD-EPI Creatinine Equation (2021)    Anion gap 08/15/2024 12  5 - 15 Final   Performed at [facility], [address] [place], [place] [address]   WBC 08/15/2024 6.9  4.0 - 10.5 K/uL Final   RBC 08/15/2024 3.60 (L)  4.22 - 5.81 MIL/uL Final   Hemoglobin 08/15/2024 10.1 (L)  13.0 - 17.0 g/dL Final   HCT 87/94/7974 33.2 (L)  39.0 - 52.0 % Final   MCV 08/15/2024 92.2  80.0 - 100.0 fL Final   MCH 08/15/2024 28.1  26.0 - 34.0 pg Final   MCHC 08/15/2024 30.4  30.0 - 36.0 g/dL Final   RDW 87/94/7974 15.1  11.5 - 15.5 % Final   Platelets 08/15/2024 306  150 - 400 K/uL Final   nRBC 08/15/2024 0.0  0.0 - 0.2 % Final   Neutrophils Relative % 08/15/2024 65  % Final   Neutro Abs 08/15/2024 4.5  1.7 - 7.7 K/uL Final   Lymphocytes Relative 08/15/2024 23  % Final   Lymphs Abs 08/15/2024 1.6  0.7 - 4.0 K/uL Final   Monocytes Relative 08/15/2024 8  % Final   Monocytes Absolute 08/15/2024 0.5  0.1 - 1.0 K/uL Final   Eosinophils Relative 08/15/2024 3  % Final   Eosinophils Absolute 08/15/2024 0.2  0.0 - 0.5 K/uL Final   Basophils Relative 08/15/2024 0  % Final   Basophils Absolute 08/15/2024 0.0  0.0 - 0.1 K/uL Final   Immature Granulocytes 08/15/2024 1  % Final   Abs Immature  Granulocytes 08/15/2024 0.04  0.00 - 0.07 K/uL Final   Performed at [facility], [address] [place], [place] [address]    ECG: Date: 08/15/2024  Time ECG obtained: 0951 AM Rate: 73 bpm Rhythm: Normal sinus rhythm with occasional PVCs Axis (leads I and aVF): normal Intervals: QRS 84 ms. QTc 434 ms. ST segment and T wave changes: No evidence of acute T wave abnormalities or significant ST segment elevation or depression.  Evidence of a possible, age undetermined, prior infarct:  No Comparison: Similar to previous tracing obtained on 05/07/2024   IMAGING / PROCEDURES: CT ABDOMEN PELVIS W CONTRAST performed on 08/19/2024 Interval development of an abdominal wall abscess in the right posterior flank. Severe sigmoid diverticulosis. No bowel obstruction. Small loculated right pleural effusion with right lung base pleural thickening and atelectasis Aortic atherosclerosis  ECHO TEE performed on 03/12/2024 Left ventricular ejection fraction, by estimation, is 55 to 60%. The left ventricle has normal function.  Right ventricular systolic function is normal. The right ventricular size is normal.  No left atrial/left atrial appendage thrombus was detected.  The mitral valve is normal in structure. Mild mitral valve regurgitation.  The aortic valve is tricuspid. Aortic valve regurgitation is mild. Aortic valve sclerosis/calcification is present, without any evidence of aortic stenosis.   TRANSTHORACIC ECHOCARDIOGRAM performed on 03/11/2024 Left ventricular ejection fraction, by estimation, is 55 to 60%. The left ventricle has normal function. Left ventricular endocardial border not optimally defined to evaluate regional wall motion. There is mild left  ventricular hypertrophy. Left ventricular diastolic parameters are consistent with Grade I diastolic dysfunction (impaired relaxation).  Right ventricular systolic function is normal. The right ventricular size is normal. Mildly  increased right ventricular wall thickness.  The mitral valve is abnormal. Trivial mitral valve regurgitation. No evidence of mitral stenosis.  The aortic valve has an indeterminant number of cusps. There is mild thickening of the aortic valve. Aortic valve regurgitation is mild. Mild  to moderate aortic valve stenosis. Aortic valve area, by VTI measures 1.53 cm. Aortic valve mean gradient  measures 16.0 mmHg.  Aortic dilatation noted. There is borderline dilatation of the aortic root, measuring 40 mm.   LONG TERM CARDIAC EVENT MONITOR STUDY performed on 02/28/2024 The patient was monitored for 7 days, 14 hours. The predominant rhythm was sinus with an average rate of 85 bpm (range 58-120 bpm in sinus). There were rare PACs and frequent PVCs (PVC burden ~30%). 917 episodes of nonsustained ventricular tachycardia were observed, lasting up to 10 beats with a maximum rate of 218 bpm. 102 supraventricular runs were observed, lasting up to 15 beats with a maximum rate of 160 bpm. No sustained arrhythmia or prolonged pause was observed. There were no patient triggered events.  RIGHT/LEFT HEART CATHETERIZATION AND CORONARY ANGIOGRAPHY performed on 01/25/2024 Severe native coronary artery disease, as detailed below, similar to last catheterization in 2019 other than interval occlusion of RPAV branch, which is supplied by left-to-right collaterals. Widely patent LIMA-LAD, SVG-D1, and SVG-RPDA. Normal left heart, right heart, and pulmonary artery pressures, as detailed below. Normal Fick cardiac output/index. Recommendations: Continue aggressive secondary prevention of coronary artery disease. Defer resumption of apixaban  pending improvement in hemoglobin in the setting of recent iatrogenic hemothorax.  Continue iron supplementation. Consider noncardiogenic etiologies if right pleural effusion recurs, given normal left and right heart filling pressures and cardiac output. Continue chest lesion and  goal-directed medical therapy for heart failure with mildly reduced ejection fraction, as tolerated. Consider outpatient cardiac event monitor to assess PVC burden, as this could be contributing to cardiomyopathy and persistent fatigue.   CORONARY ARTERY BYPASS GRAFTING performed on 01/18/2018 Three-vessel CABG procedure LIMA-LAD SVG-diagonal SVG-posterolateral branch of the RCA   LEFT HEART CATHETERIZATION AND CORONARY ANGIOGRAPHY performed on 12/21/2017 Significant three-vessel CAD 75% mid LAD 90% D1 80-99% OM1/2 CTO of the proximal RCA with left-to-right collaterals Mildly elevated LVEDP = 15-20 mmHg Frequent PVCs noted during procedure Recommendations: refer to CVTS for consideration of revascularization procedure   IMPRESSION AND PLAN: [name] Young. has been referred for pre-anesthesia review and clearance prior to him undergoing the planned anesthetic and procedural courses. Available labs, pertinent testing, and imaging results were personally reviewed by me in preparation for upcoming operative/procedural course. [facility] medical record has been updated following extensive record review and patient interview with PAT staff.   ***Preopfix ***PPM  ATTENTION --> PENDING CLEARANCE AT THIS TIME -- NOTE/CONTENTS NOT FINAL UNTIL SIGNED This patient has been appropriately cleared by cardiology with an overall *** risk of patient experiencing significant perioperative cardiovascular complications. here at [facility]. Based on clinical review performed today (08/28/2024), barring any significant acute changes in the patient's overall condition, it is anticipated that he will be able to proceed with the planned surgical intervention. Any acute changes in clinical condition may necessitate his procedure being postponed and/or cancelled. Patient will meet with anesthesia team (MD and/or CRNA) on the day of his procedure for preoperative  evaluation/assessment. Questions regarding anesthetic course will be fielded  at that time.   Pre-surgical instructions were reviewed with the patient during his PAT appointment, and questions were fielded to satisfaction by PAT clinical staff. He has been instructed on which medications that he will need to hold prior to surgery, as well as the ones that have been deemed safe/appropriate to take on the day of his procedure. As part of the general education provided by PAT, patient made aware both verbally and in writing, that he would need to abstain from the use of any illegal substances during his perioperative course. He was advised that failure to follow the provided instructions could necessitate case cancellation or result in serious perioperative complications up to and including death. Patient encouraged to contact PAT and/or his surgeon's office to discuss any questions or concerns that may arise prior to surgery; verbalized understanding.   Dorise Pereyra, MSN, APRN, FNP-C, CEN [facility]  Perioperative Services Nurse Practitioner Phone: [phone] Fax: [phone] 08/28/2024 9:34 AM  NOTE: This note has been prepared using Dragon dictation software. Despite my best ability to proofread, there is always the potential that unintentional transcriptional errors may still occur from this process.    [1] No Known Allergies

## 2024-08-28 NOTE — Progress Notes (Signed)
 Pharmacy Quality Measure Review  This patient is appearing on a report for being at risk of failing the adherence measure for cholesterol (statin) medications this calendar year.   Medication: rosuvastatin  10 mg Last fill date: 05/29/24 for 90 day supply  Insurance report was not up to date. No action needed at this time.  Medication has been refilled as of 12/13 x90ds. Picked up 12/15. 88%

## 2024-08-29 NOTE — Progress Notes (Signed)
"  °  Subjective:  Patient ID: [name] LULLA Bernardo Mickey., male    DOB: December 23, 1940,  MRN: [id number]  [name] LULLA Bernardo Mickey. presents to clinic today for for annual diabetic foot examination, at risk foot care with history of diabetic neuropathy, and painful mycotic toenails of both feet that are difficult to trim. Pain interferes with daily activities and wearing enclosed shoe gear comfortably.  Chief Complaint  Patient presents with   Nail Problem    Thick painful toenails, 3 month follow up    Diabetes    A1C  5.5   New problem(s): None.   PCP is Rilla Baller, MD. ARNETTA 08/18/24.  Allergies[1]  Review of Systems: Negative except as noted in the HPI.  Objective: No changes noted in today's physical examination. There were no vitals filed for this visit. Artez Regis. is a pleasant 83 y.o. male obese in NAD. AAO x 3.   Diabetic foot exam was performed with the following findings:   Vascular Examination: CFT <3 seconds b/l. DP/PT pulses faintly palpable b/l. Pedal edema absent. Skin temperature gradient warm to warm b/l. Digital hair absent. No pain with calf compression. No ischemia or gangrene. No cyanosis or clubbing noted b/l.    Neurological Examination: Pt has subjective symptoms of neuropathy. Protective sensation diminished with 10g monofilament b/l.  Dermatological Examination:  No open wounds. No interdigital macerations.  Toenails 1-5 b/l thick, discolored, elongated with subungual debris and pain on dorsal palpation.    No hyperkeratotic nor porokeratotic lesions.  Diffuse scaling noted peripherally and plantarly b/l feet.  No interdigital macerations.  No blisters, no weeping. No signs of secondary bacterial infection noted.   Evidence of partial matrixectomy lateral border right hallux. Musculoskeletal Examination: Muscle strength 5/5 to all lower extremity muscle groups bilaterally.  Radiographs: None   Assessment/Plan: 1. Pain due to onychomycosis of  toenails of both feet   2. Tinea pedis of both feet   3. Diabetic polyneuropathy associated with type 2 diabetes mellitus (HCC)   4. Encounter for diabetic foot exam (HCC)     Meds ordered this encounter  Medications   ketoconazole  (NIZORAL ) 2 % cream    Sig: Apply to both feet and between toes once daily for 4 weeks.    Dispense:  60 g    Refill:  1  -Patient's family member present. All questions/concerns addressed on today's visit. -Diabetic foot examination performed today. -Continue diabetic shoes daily. -Mycotic toenails 1-5 bilaterally were debrided in length and girth with sterile nail nippers and dremel without incident. -For tinea pedis, Rx sent to pharmacy for Ketoconazole  Cream 2% to be applied once daily for six weeks. -Patient/POA to call should there be question/concern in the interim.   Return in about 3 months (around 11/19/2024).  Delon LITTIE Merlin, DPM      [facility] LOCATION: [address] [place] [address]                   Office [phone]   [facility] LOCATION: [address] [place], [place] [address] Office [phone]     [1] No Known Allergies  "

## 2024-09-02 ENCOUNTER — Encounter: Payer: Self-pay | Admitting: Surgery

## 2024-09-02 ENCOUNTER — Other Ambulatory Visit: Payer: Self-pay

## 2024-09-02 ENCOUNTER — Ambulatory Visit
Admission: RE | Admit: 2024-09-02 | Discharge: 2024-09-02 | Disposition: A | Discharge status: Home / Self Care | Attending: Surgery | Admitting: Surgery

## 2024-09-02 ENCOUNTER — Encounter: Payer: Self-pay | Admitting: Urgent Care

## 2024-09-02 DIAGNOSIS — I471 Supraventricular tachycardia, unspecified: Secondary | ICD-10-CM | POA: Diagnosis not present

## 2024-09-02 DIAGNOSIS — K219 Gastro-esophageal reflux disease without esophagitis: Secondary | ICD-10-CM | POA: Insufficient documentation

## 2024-09-02 DIAGNOSIS — M1990 Unspecified osteoarthritis, unspecified site: Secondary | ICD-10-CM | POA: Insufficient documentation

## 2024-09-02 DIAGNOSIS — Z951 Presence of aortocoronary bypass graft: Secondary | ICD-10-CM | POA: Diagnosis not present

## 2024-09-02 DIAGNOSIS — I130 Hypertensive heart and chronic kidney disease with heart failure and stage 1 through stage 4 chronic kidney disease, or unspecified chronic kidney disease: Secondary | ICD-10-CM | POA: Insufficient documentation

## 2024-09-02 DIAGNOSIS — I480 Paroxysmal atrial fibrillation: Secondary | ICD-10-CM | POA: Insufficient documentation

## 2024-09-02 DIAGNOSIS — L02217 Cutaneous abscess of flank: Secondary | ICD-10-CM | POA: Diagnosis present

## 2024-09-02 DIAGNOSIS — I2510 Atherosclerotic heart disease of native coronary artery without angina pectoris: Secondary | ICD-10-CM | POA: Insufficient documentation

## 2024-09-02 DIAGNOSIS — N400 Enlarged prostate without lower urinary tract symptoms: Secondary | ICD-10-CM | POA: Diagnosis not present

## 2024-09-02 DIAGNOSIS — E785 Hyperlipidemia, unspecified: Secondary | ICD-10-CM | POA: Insufficient documentation

## 2024-09-02 DIAGNOSIS — Z7982 Long term (current) use of aspirin: Secondary | ICD-10-CM | POA: Diagnosis not present

## 2024-09-02 DIAGNOSIS — I472 Ventricular tachycardia, unspecified: Secondary | ICD-10-CM | POA: Insufficient documentation

## 2024-09-02 DIAGNOSIS — E1122 Type 2 diabetes mellitus with diabetic chronic kidney disease: Secondary | ICD-10-CM | POA: Diagnosis not present

## 2024-09-02 DIAGNOSIS — N183 Chronic kidney disease, stage 3 unspecified: Secondary | ICD-10-CM | POA: Insufficient documentation

## 2024-09-02 DIAGNOSIS — I5022 Chronic systolic (congestive) heart failure: Secondary | ICD-10-CM | POA: Insufficient documentation

## 2024-09-02 DIAGNOSIS — Z79899 Other long term (current) drug therapy: Secondary | ICD-10-CM | POA: Insufficient documentation

## 2024-09-02 DIAGNOSIS — I255 Ischemic cardiomyopathy: Secondary | ICD-10-CM | POA: Diagnosis not present

## 2024-09-02 DIAGNOSIS — I700 Atherosclerosis of aorta: Secondary | ICD-10-CM | POA: Insufficient documentation

## 2024-09-02 DIAGNOSIS — I080 Rheumatic disorders of both mitral and aortic valves: Secondary | ICD-10-CM | POA: Diagnosis not present

## 2024-09-02 DIAGNOSIS — E1169 Type 2 diabetes mellitus with other specified complication: Secondary | ICD-10-CM

## 2024-09-02 HISTORY — DX: Cardiac murmur, unspecified: R01.1

## 2024-09-02 HISTORY — PX: INCISION AND DRAINAGE, ABSCESS, TORSO: SHX7381

## 2024-09-02 HISTORY — DX: Other ventricular tachycardia: I47.29

## 2024-09-02 HISTORY — DX: Supraventricular tachycardia, unspecified: I47.10

## 2024-09-02 HISTORY — DX: Long term (current) use of anticoagulants: Z79.01

## 2024-09-02 LAB — GLUCOSE, CAPILLARY
Glucose-Capillary: 114 mg/dL — ABNORMAL HIGH (ref 70–99)
Glucose-Capillary: 121 mg/dL — ABNORMAL HIGH (ref 70–99)

## 2024-09-02 SURGERY — INCISION AND DRAINAGE, ABSCESS, TORSO
Anesthesia: General | Site: Flank | Laterality: Right

## 2024-09-02 MED ORDER — SODIUM CHLORIDE 0.9 % IV SOLN: INTRAVENOUS | Status: DC

## 2024-09-02 MED ORDER — ROCURONIUM BROMIDE 10 MG/ML (PF) SYRINGE
PREFILLED_SYRINGE | INTRAVENOUS | Status: AC
  Filled 2024-09-02: qty 10

## 2024-09-02 MED ORDER — ROCURONIUM BROMIDE 100 MG/10ML IV SOLN
INTRAVENOUS | Status: DC | PRN
  Administered 2024-09-02: 50 mg via INTRAVENOUS

## 2024-09-02 MED ORDER — EPHEDRINE SULFATE-NACL 50-0.9 MG/10ML-% IV SOSY
PREFILLED_SYRINGE | INTRAVENOUS | Status: DC | PRN
  Administered 2024-09-02: 10 mg via INTRAVENOUS
  Administered 2024-09-02 (×2): 5 mg via INTRAVENOUS

## 2024-09-02 MED ORDER — OXYCODONE HCL 5 MG PO TABS: 5.0000 mg | ORAL_TABLET | Freq: Once | ORAL | Status: DC | PRN

## 2024-09-02 MED ORDER — PROPOFOL 1000 MG/100ML IV EMUL
INTRAVENOUS | Status: AC
  Filled 2024-09-02: qty 100

## 2024-09-02 MED ORDER — CEFAZOLIN SODIUM-DEXTROSE 2-4 GM/100ML-% IV SOLN
2.0000 g | INTRAVENOUS | Status: AC
  Administered 2024-09-02: 2 g via INTRAVENOUS

## 2024-09-02 MED ORDER — CHLORHEXIDINE GLUCONATE 0.12 % MT SOLN
OROMUCOSAL | Status: AC
  Filled 2024-09-02: qty 15

## 2024-09-02 MED ORDER — BUPIVACAINE-EPINEPHRINE (PF) 0.5% -1:200000 IJ SOLN
INTRAMUSCULAR | Status: AC
  Filled 2024-09-02: qty 30

## 2024-09-02 MED ORDER — BUPIVACAINE-EPINEPHRINE (PF) 0.5% -1:200000 IJ SOLN
INTRAMUSCULAR | Status: DC | PRN
  Administered 2024-09-02: 10 mL

## 2024-09-02 MED ORDER — FENTANYL CITRATE (PF) 100 MCG/2ML IJ SOLN: 25.0000 ug | INTRAMUSCULAR | Status: DC | PRN

## 2024-09-02 MED ORDER — PROPOFOL 10 MG/ML IV BOLUS
INTRAVENOUS | Status: DC | PRN
  Administered 2024-09-02: 100 mg via INTRAVENOUS

## 2024-09-02 MED ORDER — LIDOCAINE HCL (CARDIAC) PF 100 MG/5ML IV SOSY
PREFILLED_SYRINGE | INTRAVENOUS | Status: DC | PRN
  Administered 2024-09-02: 100 mg via INTRAVENOUS

## 2024-09-02 MED ORDER — OXYCODONE HCL 5 MG/5ML PO SOLN: 5.0000 mg | Freq: Once | ORAL | Status: DC | PRN

## 2024-09-02 MED ORDER — 0.9 % SODIUM CHLORIDE (POUR BTL) OPTIME
TOPICAL | Status: DC | PRN
  Administered 2024-09-02: 500 mL

## 2024-09-02 MED ORDER — FENTANYL CITRATE (PF) 100 MCG/2ML IJ SOLN
INTRAMUSCULAR | Status: DC | PRN
  Administered 2024-09-02: 50 ug via INTRAVENOUS

## 2024-09-02 MED ORDER — TORSEMIDE 20 MG PO TABS: 20.0000 mg | ORAL_TABLET | ORAL | Status: DC

## 2024-09-02 MED ORDER — PROPOFOL 10 MG/ML IV BOLUS
INTRAVENOUS | Status: AC
  Filled 2024-09-02: qty 40

## 2024-09-02 MED ORDER — PHENYLEPHRINE 80 MCG/ML (10ML) SYRINGE FOR IV PUSH (FOR BLOOD PRESSURE SUPPORT)
PREFILLED_SYRINGE | INTRAVENOUS | Status: DC | PRN
  Administered 2024-09-02: 80 ug via INTRAVENOUS

## 2024-09-02 MED ORDER — LIDOCAINE HCL (PF) 2 % IJ SOLN
INTRAMUSCULAR | Status: AC
  Filled 2024-09-02: qty 5

## 2024-09-02 MED ORDER — FENTANYL CITRATE (PF) 100 MCG/2ML IJ SOLN
INTRAMUSCULAR | Status: AC
  Filled 2024-09-02: qty 2

## 2024-09-02 MED ORDER — CHLORHEXIDINE GLUCONATE CLOTH 2 % EX PADS
6.0000 | MEDICATED_PAD | Freq: Once | CUTANEOUS | Status: AC
  Administered 2024-09-02: 6 via TOPICAL

## 2024-09-02 MED ORDER — OXYCODONE-ACETAMINOPHEN 5-325 MG PO TABS
1.0000 | ORAL_TABLET | Freq: Three times a day (TID) | ORAL | 0 refills | Status: DC | PRN
  Filled 2024-09-02: qty 6, 2d supply, fill #0

## 2024-09-02 MED ORDER — ONDANSETRON HCL 4 MG/2ML IJ SOLN
INTRAMUSCULAR | Status: DC | PRN
  Administered 2024-09-02: 4 mg via INTRAVENOUS

## 2024-09-02 MED ORDER — PROPOFOL 10 MG/ML IV BOLUS
INTRAVENOUS | Status: AC
  Filled 2024-09-02: qty 60

## 2024-09-02 MED ORDER — DOCUSATE SODIUM 100 MG PO CAPS
100.0000 mg | ORAL_CAPSULE | Freq: Two times a day (BID) | ORAL | 0 refills | Status: AC | PRN
  Filled 2024-09-02: qty 20, 10d supply, fill #0

## 2024-09-02 MED ORDER — CEFAZOLIN SODIUM-DEXTROSE 2-4 GM/100ML-% IV SOLN
INTRAVENOUS | Status: AC
  Filled 2024-09-02: qty 100

## 2024-09-02 MED ORDER — SUGAMMADEX SODIUM 200 MG/2ML IV SOLN
INTRAVENOUS | Status: DC | PRN
  Administered 2024-09-02: 200 mg via INTRAVENOUS

## 2024-09-02 MED ORDER — CHLORHEXIDINE GLUCONATE 0.12 % MT SOLN
15.0000 mL | Freq: Once | OROMUCOSAL | Status: AC
  Administered 2024-09-02: 15 mL via OROMUCOSAL

## 2024-09-02 MED ORDER — MIDAZOLAM HCL 2 MG/2ML IJ SOLN
INTRAMUSCULAR | Status: AC
  Filled 2024-09-02: qty 2

## 2024-09-02 MED ORDER — PROPOFOL 10 MG/ML IV BOLUS
INTRAVENOUS | Status: AC
  Filled 2024-09-02: qty 20

## 2024-09-02 MED ORDER — PHENYLEPHRINE 80 MCG/ML (10ML) SYRINGE FOR IV PUSH (FOR BLOOD PRESSURE SUPPORT)
PREFILLED_SYRINGE | INTRAVENOUS | Status: AC
  Filled 2024-09-02: qty 10

## 2024-09-02 MED ORDER — ORAL CARE MOUTH RINSE: 15.0000 mL | Freq: Once | OROMUCOSAL | Status: AC

## 2024-09-02 MED ORDER — ONDANSETRON HCL 4 MG/2ML IJ SOLN: 4.0000 mg | Freq: Once | INTRAMUSCULAR | Status: DC | PRN

## 2024-09-02 MED ORDER — ACETAMINOPHEN 325 MG PO TABS
650.0000 mg | ORAL_TABLET | Freq: Three times a day (TID) | ORAL | 0 refills | Status: AC | PRN
  Filled 2024-09-02: qty 40, 7d supply, fill #0

## 2024-09-02 SURGICAL SUPPLY — 27 items
BLADE SURG 15 STRL LF DISP TIS (BLADE) ×1 IMPLANT
CANISTER WOUND CARE 500ML ATS (WOUND CARE) ×1 IMPLANT
CNTNR URN SCR LID CUP LEK RST (MISCELLANEOUS) ×1 IMPLANT
DERMABOND ADVANCED .7 DNX12 (GAUZE/BANDAGES/DRESSINGS) IMPLANT
DRAPE LAPAROTOMY 100X77 ABD (DRAPES) ×1 IMPLANT
ELECTRODE REM PT RTRN 9FT ADLT (ELECTROSURGICAL) ×1 IMPLANT
GAUZE 4X4 16PLY ~~LOC~~+RFID DBL (SPONGE) IMPLANT
GAUZE PACKING IODOFORM 1/2INX (GAUZE/BANDAGES/DRESSINGS) IMPLANT
GLOVE BIOGEL PI IND STRL 7.0 (GLOVE) ×1 IMPLANT
GLOVE SURG SYN 6.5 PF PI (GLOVE) ×3 IMPLANT
GOWN STRL REUS W/ TWL LRG LVL3 (GOWN DISPOSABLE) ×3 IMPLANT
HANDPIECE VERSAJET DEBRIDEMENT (MISCELLANEOUS) IMPLANT
IV 0.9% NACL 1000 ML (IV SOLUTION) IMPLANT
LABEL OR SOLS (LABEL) ×1 IMPLANT
MANIFOLD NEPTUNE II (INSTRUMENTS) ×1 IMPLANT
NDL HYPO 22X1.5 SAFETY MO (MISCELLANEOUS) IMPLANT
NEEDLE HYPO 22X1.5 SAFETY MO (MISCELLANEOUS) IMPLANT
NS IRRIG 500ML POUR BTL (IV SOLUTION) ×1 IMPLANT
PACK BASIN MINOR ARMC (MISCELLANEOUS) ×1 IMPLANT
SOLN STERILE WATER 500 ML (IV SOLUTION) ×1 IMPLANT
SOLUTION PREP PVP 2OZ (MISCELLANEOUS) IMPLANT
SPONGE T-LAP 18X18 ~~LOC~~+RFID (SPONGE) IMPLANT
SUTURE MNCRL 4-0 27XMF (SUTURE) IMPLANT
SWAB CULTURE AMIES ANAERIB BLU (MISCELLANEOUS) IMPLANT
SYR 10ML LL (SYRINGE) IMPLANT
TOWEL OR 17X26 4PK STRL BLUE (TOWEL DISPOSABLE) IMPLANT
TRAP FLUID SMOKE EVACUATOR (MISCELLANEOUS) ×1 IMPLANT

## 2024-09-02 NOTE — Anesthesia Postprocedure Evaluation (Signed)
"   Anesthesia Post Note  Patient: [name] Young.  Procedure(s) Performed: INCISION AND DRAINAGE, ABSCESS, TORSO RIGHT (Right: Flank)  Patient location during evaluation: PACU Anesthesia Type: General Level of consciousness: awake and awake and alert Pain management: satisfactory to patient Vital Signs Assessment: post-procedure vital signs reviewed and stable Respiratory status: spontaneous breathing Cardiovascular status: blood pressure returned to baseline Anesthetic complications: no   No notable events documented.   Last Vitals:  Vitals:   09/02/24 1245 09/02/24 1300  BP: 129/67 130/69  Pulse: 61 62  Resp: 16 20  Temp:    SpO2: 100% 99%    Last Pain:  Vitals:   09/02/24 1300  TempSrc:   PainSc: 0-No pain                 VAN STAVEREN,Denai Caba      "

## 2024-09-02 NOTE — Op Note (Signed)
 Preoperative diagnosis: Right flank abscess Postoperative diagnosis: same  Procedure: Incision and drainage of right flank abscess cavity  Anesthesia: GETA  Surgeon: TYE  Wound Classification: Contaminated  Indications: Patient is a 83 y.o. male  presented with above.  See H&P for further details.  Specimen: Right flank abscess cavity tissue for cultures  Complications: None  Estimated Blood Loss: 10 mL  Findings:  1.  Chronic irritated encapsulated like tissue around the area of concern with scant purulent fluid, mostly serous 2. area drained and cultured 3. Adequate hemostasis.   Description of procedure: The patient was placed in the prone position and GETA anesthesia was induced. The area was prepped and draped in the usual sterile fashion. A timeout was completed verifying correct patient, procedure, site, positioning, and implant(s) and/or special equipment prior to beginning this procedure.  Local infused over planned incision site.  Inicision made and hemostat used to dissect down to the area of concern right above the muscle layer but still within the subcutaneous area.  There was some chronically inflamed tissue along with dense tissue consistent with a former abscess cavity.  During the dissection towards the area, some serous drainage as well as scant purulent material noted.  The firm tissue cavity excised completely and portion and sent off field for cultures.    After confirming removal of all inflamed tissue, wound copiously irrigated, hemostasis confirmed, and incision closed with running 4-0 Monocryl in a subcuticular fashion.    The patient tolerated the procedure well and was taken to the postanesthesia care unit in satisfactory condition.

## 2024-09-02 NOTE — Anesthesia Preprocedure Evaluation (Addendum)
 "                                  Anesthesia Evaluation  Patient identified by MRN, date of birth, ID band Patient awake    Reviewed: Allergy & Precautions, NPO status , Patient's Chart, lab work & pertinent test results  Airway Mallampati: II  TM Distance: >3 FB Neck ROM: Full    Dental  (+) Teeth Intact   Pulmonary neg pulmonary ROS   Pulmonary exam normal        Cardiovascular Exercise Tolerance: Poor hypertension, Pt. on medications + CAD, + CABG and + DOE  Normal cardiovascular exam Rhythm:Regular Rate:Normal  Mildmoderate aortic stenosis   Neuro/Psych   Anxiety     negative neurological ROS  negative psych ROS   GI/Hepatic negative GI ROS, Neg liver ROS,GERD  Medicated,,  Endo/Other  diabetes, Type 2    Renal/GU CRFRenal disease     Musculoskeletal   Abdominal   Peds negative pediatric ROS (+)  Hematology negative hematology ROS (+) Blood dyscrasia, anemia   Anesthesia Other Findings Past Medical History: No date: Adenomatous colon polyp No date: Anemia No date: Aortic atherosclerosis No date: Aortic stenosis     Comment:  a. 11/2023 Echo: mild-mod AS, mean grad . No date: Arthritis No date: Ascending aorta dilatation     Comment:  a.) TTE 11/23/2017: asc Ao measured 37 mm; b.) TTE               10/23/2021: Ao root measured 41 mm, asc Ao measured 38               mm; c.) TEE 10/28/2021: asc Ao measured 38 mm; d.) TTE               01/11/2022: Ao root 40 mm, asc Ao 39 mm 10/2021: Ascending cholangitis (HCC) 10/24/2021: Bacteremia due to Streptococcus No date: BPH (benign prostatic hyperplasia) No date: Cardiac murmur 04/25/2022: CCC (chronic calculous cholecystitis)     Comment:  Cholecystectomy 05/2022 - chronic cholecystitis with               cholelithiasis     No date: CKD (chronic kidney disease), stage III (HCC) No date: Claustrophobia No date: Coronary artery disease     Comment:  a.) LHC 12/21/2017: 75% mLAD, 90% D1,  80-99% OM1/2, CTO               pRCA (L-R collaterals) --> CVTS consult. b.) 01/2018 CABG               x 3 (LIMA->LAD, VG->D1, VG->RPL); c. 01/2024 Cath: LM nl,               LAD 20p, 75m, D1 70ost, 90, LCX 30ost, OM1 80, OM2 99-               fills from collats from D1. RCA 100p CTO, RPAV 100 (fills              via collats from 3rd septal), VG->RPDA nl, LIMA->LAD nl,               VG->D1 nl. RHC PA 35/10 (8), PCWP 10, CO 6.5-->Med Rx. No date: Diverticulosis No date: Dyspnea 10/2021: Endocarditis of mitral valve     Comment:  In setting of bacteremia from ascending colangitis No date: GERD (gastroesophageal reflux disease) 11/2023: Hemothorax on right No date: HFrEF (heart failure  with reduced ejection fraction) (HCC)     Comment:  a.) TTE 11/23/2017: EF 50-55%, mod MAC, triv TR, G1DD;               b.) TTE 10/23/2021: EF 40-45%, mild LAE, Ao sclerosis,               triv MR, G2DD; c.) TEE 10/28/2021: EF 40-45%, glob HK,               mobile vegitation on MV; d.) TTE 01/11/2022: EF 50-55%,               mild LVH, RVE, Ao sclerosis, mild MR/AR, G1DD No date: History of cholelithiasis No date: History of kidney stones     Comment:  ca ox [place] @ [facility]) now [place] No date: History of pneumonia No date: HLD (hyperlipidemia) No date: HTN (hypertension) No date: Ischemic cardiomyopathy     Comment:  a. 12/2022 Echo: EF 50-55%; b. 11/2023 Echo: EF 40-45%,               glob HK, mild LVH, GrI DD, nl RV fxn, mild MR, mild AI,               mild-mod AS, Ao root 43mm. No date: Jaundice     Comment:  age 83 No date: NSVT (nonsustained ventricular tachycardia) (HCC) No date: On apixaban  therapy No date: Paroxysmal atrial fibrillation (HCC)     Comment:  a.) CHA2DS2VASc = 6 (age x 2, HFrEF, HTN, vascular               disease history, T2DM);  b.) rate/rhythm maintained on               oral metoprolol  succinate; chronically anticoagulated               with apixaban  No date: Pleural  effusion     Comment:  a. 11/2023 CT chest: Large R ple effusion, partially               loculated-->s/p pleurx. No date: Pneumonia No date: PSVT (paroxysmal supraventricular tachycardia) No date: Right-sided carotid artery disease     Comment:  a.) carotid doppler 04/05/2022: 1-39% RICA No date: S/P CABG x 3     Comment:  a.) LIMA-LAD, SVG-diagonal, SVG-PL branch of RCA No date: S/P cataract extraction and insertion of intraocular lens 2010: T2DM (type 2 diabetes mellitus) (HCC)  Past Surgical History: No date: CARPAL TUNNEL RELEASE; Bilateral No date: CATARACT EXTRACTION, BILATERAL [id number]: CHOLECYSTECTOMY 11/2012: COLONOSCOPY     Comment:  12 adenomatous polyps, diverticulosis, rec rpt 1 yr               Marianne) 12/2013: COLONOSCOPY     Comment:  3 polyps, diverticulosis, rec rpt 3 yrs Marianne) 06/2019: COLONOSCOPY     Comment:  6 polyps (TA), diverticulosis, f/u left open ended               Marianne) 01/18/2018: CORONARY ARTERY BYPASS GRAFT; N/A     Comment:  Procedure: CORONARY ARTERY BYPASS GRAFTING (CABG) x 3;               Using Left Internal Mammary Artery, and Right Greater               Saphenous Vein harvested Endoscopically, Coronary Artery               Endarterectomy;  Surgeon: [name] Hanford Coy, MD;  Location: [facility] OR;  Service: Open Heart Surgery;                Laterality: N/A; 10/23/2021: ERCP; N/A     Comment:  Procedure: ENDOSCOPIC RETROGRADE               CHOLANGIOPANCREATOGRAPHY (ERCP);  Surgeon: Aneita Gwendlyn DASEN, MD;  Location: [facility] ENDOSCOPY;  Service: Endoscopy;                Laterality: N/A; No date: EYE SURGERY 01/08/2023: IR CATHETER TUBE CHANGE 03/07/2024: IR PATIENT EVAL TECH 0-60 MINS 11/20/2023: IR PERC PLEURAL DRAIN W/INDWELL CATH W/IMG GUIDE 01/23/2023: IR RADIOLOGIST EVAL & MGMT 04/01/2024: IR RADIOLOGIST EVAL & MGMT 12/29/2022: IR THORACENTESIS ASP PLEURAL SPACE W/IMG GUIDE No date: KNEE CARTILAGE SURGERY;  Left 12/21/2017: LEFT HEART CATH AND CORONARY ANGIOGRAPHY; N/A     Comment:  Procedure: LEFT HEART CATH AND CORONARY ANGIOGRAPHY;                Surgeon: Mady Bruckner, MD;  Location: [facility] INVASIVE CV               LAB;  Service: Cardiovascular;  Laterality: N/A; No date: LITHOTRIPSY 10/23/2021: REMOVAL OF STONES     Comment:  Procedure: REMOVAL OF STONES;  Surgeon: Aneita Gwendlyn DASEN, MD;  Location: [facility] ENDOSCOPY;  Service: Endoscopy;; 01/25/2024: RIGHT/LEFT HEART CATH AND CORONARY/GRAFT ANGIOGRAPHY;  Bilateral     Comment:  Procedure: RIGHT/LEFT HEART CATH AND CORONARY/GRAFT               ANGIOGRAPHY;  Surgeon: Mady Bruckner, MD;  Location:               [facility] INVASIVE CV LAB;  Service: Cardiovascular;                Laterality: Bilateral; 10/23/2021: SPHINCTEROTOMY     Comment:  Procedure: SPHINCTEROTOMY;  Surgeon: Aneita Gwendlyn DASEN,               MD;  Location: [facility] ENDOSCOPY;  Service: Endoscopy;; 01/18/2018: TEE WITHOUT CARDIOVERSION; N/A     Comment:  Procedure: TRANSESOPHAGEAL ECHOCARDIOGRAM (TEE);                Surgeon: [name] Ochoa, Maude, MD;  Location: [facility] OR;                Service: Open Heart Surgery;  Laterality: N/A; 10/28/2021: TEE WITHOUT CARDIOVERSION; N/A     Comment:  Procedure: TRANSESOPHAGEAL ECHOCARDIOGRAM (TEE);                Surgeon: Mona Vinie BROCKS, MD;  Location: [facility] ENDOSCOPY;                Service: Cardiovascular;  Laterality: N/A; 03/12/2024: TEE WITHOUT CARDIOVERSION; N/A     Comment:  Procedure: ECHOCARDIOGRAM, TRANSESOPHAGEAL;  Surgeon:               Darliss Rogue, MD;  Location: [facility] ORS;  Service:               Cardiovascular;  Laterality: N/A; No date: UMBILICAL HERNIA REPAIR     Comment:  with mesh     Reproductive/Obstetrics negative OB ROS  Anesthesia Physical Anesthesia Plan  ASA: 3  Anesthesia Plan: General   Post-op Pain Management:    Induction:   PONV Risk  Score and Plan: Ondansetron , Dexamethasone, Midazolam  and Treatment may vary due to age or medical condition  Airway Management Planned: Oral ETT  Additional Equipment:   Intra-op Plan:   Post-operative Plan: Extubation in OR  Informed Consent: I have reviewed the patients History and Physical, chart, labs and discussed the procedure including the risks, benefits and alternatives for the proposed anesthesia with the patient or authorized representative who has indicated his/her understanding and acceptance.     Dental Advisory Given  Plan Discussed with: CRNA  Anesthesia Plan Comments:          Anesthesia Quick Evaluation  "

## 2024-09-02 NOTE — Anesthesia Procedure Notes (Addendum)
 Procedure Name: Intubation Date/Time: 09/02/2024 11:33 AM  Performed by: Abdul Andrea LABOR, CRNAPre-anesthesia Checklist: Patient identified, Patient being monitored, Timeout performed, Emergency Drugs available and Suction available Patient Re-evaluated:Patient Re-evaluated prior to induction Oxygen Delivery Method: Circle system utilized Preoxygenation: Pre-oxygenation with 100% oxygen Induction Type: IV induction Ventilation: Mask ventilation without difficulty Laryngoscope Size: Glidescope and 4 Grade View: Grade I Tube type: Oral Tube size: 7.0 mm Number of attempts: 1 Airway Equipment and Method: Stylet Placement Confirmation: ETT inserted through vocal cords under direct vision, positive ETCO2 and breath sounds checked- equal and bilateral Secured at: 22 cm Tube secured with: Tape Dental Injury: Teeth and Oropharynx as per pre-operative assessment

## 2024-09-02 NOTE — Discharge Instructions (Signed)
 Incision and Drainage, Care After This sheet gives you information about how to care for yourself after your procedure. Your health care provider may also give you more specific instructions. If you have problems or questions, contact your health care provider. What can I expect after the procedure? After the procedure, it is common to have: Soreness. Bruising. Itching. Follow these instructions at home: site care Follow instructions from your health care provider about how to take care of your site. Make sure you: Wash your hands with soap and water before and after you change your bandage (dressing). If soap and water are not available, use hand sanitizer. Leave stitches (sutures), skin glue, or adhesive strips in place. These skin closures may need to stay in place for 2 weeks or longer. If adhesive strip edges start to loosen and curl up, you may trim the loose edges. Do not remove adhesive strips completely unless your health care provider tells you to do that. If the area bleeds or bruises, apply gentle pressure for 10 minutes. OK TO SHOWER IN 24HRS  Check your site every day for signs of infection. Check for: Redness, swelling, or pain. Fluid or blood. Warmth. Pus or a bad smell.  General instructions Rest and then return to your normal activities as told by your health care provider.  tylenol  and advil  as needed for discomfort.  Please alternate between the two every four hours as needed for pain.    Use narcotics, if prescribed, only when tylenol  and motrin  is not enough to control pain.  325-650mg  every 8hrs to max of 3000mg /24hrs (including the 325mg  in every norco dose) for the tylenol .    Advil  up to 800mg  per dose every 8hrs as needed for pain.   Keep all follow-up visits as told by your health care provider. This is important. Contact a health care provider if: You have redness, swelling, or pain around your site. You have fluid or blood coming from your site. Your site  feels warm to the touch. You have pus or a bad smell coming from your site. You have a fever. Your sutures, skin glue, or adhesive strips loosen or come off sooner than expected. Get help right away if: You have bleeding that does not stop with pressure or a dressing. Summary After the procedure, it is common to have some soreness, bruising, and itching at the site. Follow instructions from your health care provider about how to take care of your site. Check your site every day for signs of infection. Contact a health care provider if you have redness, swelling, or pain around your site, or your site feels warm to the touch. Keep all follow-up visits as told by your health care provider. This is important. This information is not intended to replace advice given to you by your health care provider. Make sure you discuss any questions you have with your health care provider. Document Released: 09/24/2015 Document Revised: 02/25/2018 Document Reviewed: 02/25/2018 Elsevier Interactive Patient Education  [employer].

## 2024-09-02 NOTE — Transfer of Care (Signed)
 Immediate Anesthesia Transfer of Care Note  Patient: [name] Young.  Procedure(s) Performed: INCISION AND DRAINAGE, ABSCESS, TORSO RIGHT (Right: Flank)  Patient Location: PACU  Anesthesia Type:General  Level of Consciousness: awake and alert   Airway & Oxygen Therapy: Patient Spontanous Breathing and Patient connected to face mask  Post-op Assessment: Report given to RN and Post -op Vital signs reviewed and stable  Post vital signs: Reviewed and stable  Last Vitals:  Vitals Value Taken Time  BP 116/55 09/02/24 12:16  Temp 36.1 C 09/02/24 12:15  Pulse 62 09/02/24 12:20  Resp 17 09/02/24 12:20  SpO2 100 % 09/02/24 12:20  Vitals shown include unfiled device data.  Last Pain:  Vitals:   09/02/24 1215  TempSrc:   PainSc: 0-No pain         Complications: No notable events documented.

## 2024-09-02 NOTE — Interval H&P Note (Signed)
 History and Physical Interval Note:  09/02/2024 11:00 AM  [name] Young.  has presented today for surgery, with the diagnosis of L02.217 abscess of flank.  The various methods of treatment have been discussed with the patient and family. After consideration of risks, benefits and other options for treatment, the patient has consented to  Procedures with comments: INCISION AND DRAINAGE, ABSCESS, TORSO (Right) - Lt side down in position as a surgical intervention.  The patient's history has been reviewed, patient examined, no change in status, stable for surgery.  I have reviewed the patient's chart and labs.  Questions were answered to the patient's satisfaction.     Kristi Hyer Tye

## 2024-09-03 ENCOUNTER — Encounter: Payer: Self-pay | Admitting: Surgery

## 2024-09-07 LAB — AEROBIC/ANAEROBIC CULTURE W GRAM STAIN (SURGICAL/DEEP WOUND)
Culture: NO GROWTH
Gram Stain: NONE SEEN

## 2024-09-16 NOTE — Progress Notes (Signed)
 "                                DIVISION OF PULMONARY AND CRITICAL CARE MEDICINE                              FOLLOW UP ENCOUNTER     Chief complaint: Recurrent pleural effusion with dyspnea on exertion   History of Present Illness [name] Young is an 84 year old male who presents for follow-up after treatment for an abdominal abscess.  He feels the best he has in a long time and is able to engage in activities such as working on his grapevines and feeding squirrels, although he experiences back pain during these activities. He uses a cane for support when outside and has had to sit down due to fatigue while working in his yard.  He had a cyst removed and a CT scan previously revealed a significant abdominal abscess, which required urgent surgical intervention. He no longer has a pocket of fluid in his abdomen. He is currently taking diuretics three times a week on Monday, Wednesday, and Friday to manage fluid retention and urinates regularly on these days.  He has been taking Remeron  as a sleep aid, which he finds effective in helping him fall asleep after turning off the TV. He is aware of the potential side effects of weight gain and balance issues but prefers to continue with this medication as it helps him sleep. He dismisses melatonin as ineffective for him.  He has not driven for the past two years to avoid putting others in danger.   Past Medical History:   Past Medical History:  Diagnosis Date   Allergy    Diabetes mellitus without complication (CMS/HHS-HCC)    GERD (gastroesophageal reflux disease)    Hyperlipidemia    Hypertension     Past Surgical History:  No past surgical history on file.  Allergies:  No Known Allergies  Current Medications:   Prior to Admission medications  Medication Sig Taking? Last Dose  aspirin  81 MG EC tablet Take 81 mg by mouth once daily Yes Taking  cholecalciferol  (VITAMIN D3) 1000 unit capsule Take by mouth Yes Taking   cyanocobalamin  (VITAMIN B12) 1,000 mcg SL tablet Place under the tongue 3 (three) times a week Yes Taking  lisinopriL  (ZESTRIL ) 2.5 MG tablet Take 1 tablet (2.5 mg total) by mouth once daily Yes Taking  magnesium  250 mg Tab Take 250 mg by mouth once daily Yes Taking  metoprolol  TARTrate (LOPRESSOR ) 25 MG tablet Take 12.5 mg by mouth 2 (two) times daily Yes Taking  mirtazapine  (REMERON ) 30 MG tablet Take 30 mg by mouth at bedtime Yes Taking  rosuvastatin  (CRESTOR ) 10 MG tablet  Yes Taking  tamsulosin  (FLOMAX ) 0.4 mg capsule Take 0.4 mg by mouth once daily Yes Taking  polyethylene glycol (MIRALAX ) powder Take 8.5-17 g by mouth once daily Patient not taking: Reported on 08/20/2024  Not Taking  TORsemide  (DEMADEX ) 20 MG tablet Take 1 tablet (20 mg total) by mouth every Tuesday and Thursday for 30 days      Family History:  No family history on file.  Social History:   Social History   Socioeconomic History   Marital status: Married  Tobacco Use   Smoking status: Never   Smokeless tobacco: Former    Types: Chew  Substance and Sexual Activity   Alcohol  use: Never   Drug use: Never   Social Drivers of Corporate Investment Banker Strain: Low Risk  (08/25/2024)   Overall Financial Resource Strain (CARDIA)    Difficulty of Paying Living Expenses: Not hard at all  Food Insecurity: No Food Insecurity (08/25/2024)   Hunger Vital Sign    Worried About Running Out of Food in the Last Year: Never true    Ran Out of Food in the Last Year: Never true  Transportation Needs: No Transportation Needs (08/25/2024)   PRAPARE - Administrator, Civil Service (Medical): No    Lack of Transportation (Non-Medical): No  Physical Activity: Insufficiently Active (06/24/2024)   Received from [facility]   Exercise Vital Sign    On average, how many days per week do you engage in moderate to strenuous exercise (like a brisk walk)?: 5 days    On average, how many minutes do you  engage in exercise at this level?: 10 min  Stress: No Stress Concern Present (06/24/2024)   Received from [facility] - Occupational Stress Questionnaire    Do you feel stress - tense, restless, nervous, or anxious, or unable to sleep at night because your mind is troubled all the time - these days?: Only a little  Social Connections: Socially Integrated (06/24/2024)   Received from [facility]   Social Connection and Isolation Panel    In a typical week, how many times do you talk on the phone with family, friends, or neighbors?: Twice a week    How often do you get together with friends or relatives?: Once a week    How often do you attend church or religious services?: More than 4 times per year    Do you belong to any clubs or organizations such as church groups, unions, fraternal or athletic groups, or school groups?: Yes    How often do you attend meetings of the clubs or organizations you belong to?: More than 4 times per year    Are you married, widowed, divorced, separated, never married, or living with a partner?: Married  Housing Stability: Unknown (08/25/2024)   Housing Stability Vital Sign    Unable to Pay for Housing in the Last Year: No    Homeless in the Last Year: No    Review of Systems:   A 10 point review of systems is negative, except for the pertinent positives and negatives detailed in the HPI.  Vitals:   Vitals:   09/16/24 1048  BP: 122/78  BP Location: Left upper arm  Patient Position: Sitting  BP Cuff Size: Large Adult  Pulse: 61  SpO2: 99%  Weight: (!) 104.3 kg (230 lb)  Height: 177.8 cm (5' 10)     Body mass index is 33 kg/m.  Physical Exam:   Physical Exam Vitals and nursing note reviewed.  Constitutional:      General: in no acute distress.    Appearance: Normal appearance. Is not ill-appearing, toxic-appearing or diaphoretic.  HENT:     Head: Normocephalic and atraumatic.     Right  Ear: External ear normal.     Left Ear: External ear normal.  Eyes:     General:        Right eye: No discharge.        Left eye: No discharge.     Extraocular Movements: Extraocular movements intact.     Pupils: Pupils are equal, round, and reactive to light.  Cardiovascular:     Rate and Rhythm: Normal rate and regular rhythm.     Pulses: Normal pulses.     Heart sounds: Normal heart sounds. No murmur heard.    No friction rub. No gallop.  Abdominal:     General: Bowel sounds are normal.  Skin:    General: Skin is warm and dry.     Capillary Refill: Capillary refill takes less than 2 seconds.  Neurological:     Mental Status: Patient is alert.     Lab and Imaging Results:   Results      Assessment and Plan:   Diagnoses and all orders for this visit:  Pleural effusion on right  History of abdominal surgery  Primary insomnia  Physical deconditioning  SOB (shortness of breath) -     Ambulatory Referral to Pulmonary Rehab  Other orders -     TORsemide  (DEMADEX ) 20 MG tablet; Take 1 tablet (20 mg total) by mouth every Tuesday and Thursday for 30 days    Assessment & Plan Right pleural effusion, resolved The right pleural effusion has resolved with no current fluid accumulation.  Chronic diastolic heart failure, NYHA class II Chronic diastolic heart failure is well-managed. He reports significant improvement in symptoms. - Continue diuretics on Tuesday and Thursday. - Monitor for signs of fluid retention and adjust diuretic frequency if necessary.  Physical deconditioning Present, and he is willing to participate in a physical therapy program to improve strength and balance. - Enrolled in a physical therapy program at the hospital for one hour, three days a week for three months.  Primary insomnia Managed with Mirtazapine  (Remeron ). He experiences difficulty returning to sleep after waking. Concerns about weight gain and balance issues with Mirtazapine   were discussed. He prefers to continue Mirtazapine  despite these concerns. - Continue Mirtazapine  (Remeron ) for sleep.    I spent 41 minutes in both face-to-face and non-face-to-face activities including reviewing history, performing an exam and evaluation, entering clinical information EHR, interpreting results, counseling patient/family/caregiver, reviewing x-rays/CT scans/MRI/labs/echocardiography/PFTs, ordering meds/tests/procedures, referring and communicating with consulting healthcare professionals and care coordination. This does not include time spent with staff.    The patient and/or family voices understanding of the plans. All questions and concerns were answered. The patient and /or family was instructed to call if the patient needs to be seen sooner. Thank you for allowing me to participate in the care of this patient. Do not hesitate to contact me via Epic or by calling our office at [phone] with any questions or concerns.     This note has been created using dictation software tool and any typographical errors are purely unintentional.  Patient received an After Visit Summary     "

## 2024-09-16 NOTE — Progress Notes (Signed)
 [facility] Rehab form has been completed, signed by provider, and faxed to [phone] along with demographics sheet and today's office note.

## 2024-09-29 ENCOUNTER — Other Ambulatory Visit: Payer: Self-pay | Admitting: Family Medicine

## 2024-09-29 DIAGNOSIS — E538 Deficiency of other specified B group vitamins: Secondary | ICD-10-CM

## 2024-09-29 DIAGNOSIS — R7989 Other specified abnormal findings of blood chemistry: Secondary | ICD-10-CM

## 2024-09-29 DIAGNOSIS — E1169 Type 2 diabetes mellitus with other specified complication: Secondary | ICD-10-CM

## 2024-09-29 DIAGNOSIS — I480 Paroxysmal atrial fibrillation: Secondary | ICD-10-CM

## 2024-09-29 DIAGNOSIS — E559 Vitamin D deficiency, unspecified: Secondary | ICD-10-CM

## 2024-09-29 DIAGNOSIS — E1122 Type 2 diabetes mellitus with diabetic chronic kidney disease: Secondary | ICD-10-CM

## 2024-10-01 ENCOUNTER — Ambulatory Visit: Payer: Self-pay | Admitting: Family Medicine

## 2024-10-01 ENCOUNTER — Other Ambulatory Visit

## 2024-10-01 DIAGNOSIS — E1122 Type 2 diabetes mellitus with diabetic chronic kidney disease: Secondary | ICD-10-CM

## 2024-10-01 DIAGNOSIS — E785 Hyperlipidemia, unspecified: Secondary | ICD-10-CM

## 2024-10-01 DIAGNOSIS — E1169 Type 2 diabetes mellitus with other specified complication: Secondary | ICD-10-CM | POA: Diagnosis not present

## 2024-10-01 DIAGNOSIS — R7989 Other specified abnormal findings of blood chemistry: Secondary | ICD-10-CM | POA: Diagnosis not present

## 2024-10-01 DIAGNOSIS — N183 Chronic kidney disease, stage 3 unspecified: Secondary | ICD-10-CM

## 2024-10-01 DIAGNOSIS — E559 Vitamin D deficiency, unspecified: Secondary | ICD-10-CM

## 2024-10-01 DIAGNOSIS — E538 Deficiency of other specified B group vitamins: Secondary | ICD-10-CM | POA: Diagnosis not present

## 2024-10-01 DIAGNOSIS — I480 Paroxysmal atrial fibrillation: Secondary | ICD-10-CM

## 2024-10-01 LAB — CBC WITH DIFFERENTIAL/PLATELET
Basophils Absolute: 0 K/uL (ref 0.0–0.1)
Basophils Relative: 0.4 % (ref 0.0–3.0)
Eosinophils Absolute: 0.2 K/uL (ref 0.0–0.7)
Eosinophils Relative: 3.3 % (ref 0.0–5.0)
HCT: 36.4 % — ABNORMAL LOW (ref 39.0–52.0)
Hemoglobin: 11.8 g/dL — ABNORMAL LOW (ref 13.0–17.0)
Lymphocytes Relative: 30.2 % (ref 12.0–46.0)
Lymphs Abs: 1.7 K/uL (ref 0.7–4.0)
MCHC: 32.5 g/dL (ref 30.0–36.0)
MCV: 88.4 fl (ref 78.0–100.0)
Monocytes Absolute: 0.4 K/uL (ref 0.1–1.0)
Monocytes Relative: 6.4 % (ref 3.0–12.0)
Neutro Abs: 3.5 K/uL (ref 1.4–7.7)
Neutrophils Relative %: 59.7 % (ref 43.0–77.0)
Platelets: 188 K/uL (ref 150.0–400.0)
RBC: 4.12 Mil/uL — ABNORMAL LOW (ref 4.22–5.81)
RDW: 18.5 % — ABNORMAL HIGH (ref 11.5–15.5)
WBC: 5.8 K/uL (ref 4.0–10.5)

## 2024-10-01 LAB — T4, FREE: Free T4: 0.67 ng/dL (ref 0.60–1.60)

## 2024-10-01 LAB — LIPID PANEL
Cholesterol: 127 mg/dL (ref 28–200)
HDL: 29.5 mg/dL — ABNORMAL LOW
LDL Cholesterol: 36 mg/dL (ref 10–99)
NonHDL: 97.96
Total CHOL/HDL Ratio: 4
Triglycerides: 312 mg/dL — ABNORMAL HIGH (ref 10.0–149.0)
VLDL: 62.4 mg/dL — ABNORMAL HIGH (ref 0.0–40.0)

## 2024-10-01 LAB — COMPREHENSIVE METABOLIC PANEL WITH GFR
ALT: 15 U/L (ref 3–53)
AST: 17 U/L (ref 5–37)
Albumin: 4 g/dL (ref 3.5–5.2)
Alkaline Phosphatase: 65 U/L (ref 39–117)
BUN: 24 mg/dL — ABNORMAL HIGH (ref 6–23)
CO2: 32 meq/L (ref 19–32)
Calcium: 9 mg/dL (ref 8.4–10.5)
Chloride: 104 meq/L (ref 96–112)
Creatinine, Ser: 1.19 mg/dL (ref 0.40–1.50)
GFR: 56.98 mL/min — ABNORMAL LOW
Glucose, Bld: 114 mg/dL — ABNORMAL HIGH (ref 70–99)
Potassium: 4.5 meq/L (ref 3.5–5.1)
Sodium: 142 meq/L (ref 135–145)
Total Bilirubin: 0.5 mg/dL (ref 0.2–1.2)
Total Protein: 7.2 g/dL (ref 6.0–8.3)

## 2024-10-01 LAB — VITAMIN B12: Vitamin B-12: 400 pg/mL (ref 211–911)

## 2024-10-01 LAB — MICROALBUMIN / CREATININE URINE RATIO
Creatinine,U: 121.3 mg/dL
Microalb Creat Ratio: 10.7 mg/g (ref 0.0–30.0)
Microalb, Ur: 1.3 mg/dL (ref 0.7–1.9)

## 2024-10-01 LAB — PHOSPHORUS: Phosphorus: 4 mg/dL (ref 2.3–4.6)

## 2024-10-01 LAB — HEMOGLOBIN A1C: Hgb A1c MFr Bld: 5.7 % (ref 4.6–6.5)

## 2024-10-01 LAB — TSH: TSH: 12.75 u[IU]/mL — ABNORMAL HIGH (ref 0.35–5.50)

## 2024-10-01 LAB — VITAMIN D 25 HYDROXY (VIT D DEFICIENCY, FRACTURES): VITD: 36.8 ng/mL (ref 30.00–100.00)

## 2024-10-02 LAB — PARATHYROID HORMONE, INTACT (NO CA): PTH: 31 pg/mL (ref 16–77)

## 2024-10-08 ENCOUNTER — Ambulatory Visit: Admitting: Family Medicine

## 2024-10-08 ENCOUNTER — Encounter: Payer: Self-pay | Admitting: Family Medicine

## 2024-10-08 VITALS — BP 142/78 | HR 60 | Temp 98.1°F | Ht 69.0 in | Wt 238.4 lb

## 2024-10-08 DIAGNOSIS — Z0000 Encounter for general adult medical examination without abnormal findings: Secondary | ICD-10-CM

## 2024-10-08 DIAGNOSIS — E038 Other specified hypothyroidism: Secondary | ICD-10-CM

## 2024-10-08 DIAGNOSIS — Z23 Encounter for immunization: Secondary | ICD-10-CM | POA: Diagnosis not present

## 2024-10-08 DIAGNOSIS — Z951 Presence of aortocoronary bypass graft: Secondary | ICD-10-CM

## 2024-10-08 DIAGNOSIS — I2510 Atherosclerotic heart disease of native coronary artery without angina pectoris: Secondary | ICD-10-CM

## 2024-10-08 DIAGNOSIS — I130 Hypertensive heart and chronic kidney disease with heart failure and stage 1 through stage 4 chronic kidney disease, or unspecified chronic kidney disease: Secondary | ICD-10-CM | POA: Diagnosis not present

## 2024-10-08 DIAGNOSIS — E785 Hyperlipidemia, unspecified: Secondary | ICD-10-CM | POA: Diagnosis not present

## 2024-10-08 DIAGNOSIS — E1169 Type 2 diabetes mellitus with other specified complication: Secondary | ICD-10-CM | POA: Diagnosis not present

## 2024-10-08 DIAGNOSIS — E538 Deficiency of other specified B group vitamins: Secondary | ICD-10-CM

## 2024-10-08 DIAGNOSIS — Z0001 Encounter for general adult medical examination with abnormal findings: Secondary | ICD-10-CM

## 2024-10-08 DIAGNOSIS — G4700 Insomnia, unspecified: Secondary | ICD-10-CM

## 2024-10-08 DIAGNOSIS — J90 Pleural effusion, not elsewhere classified: Secondary | ICD-10-CM

## 2024-10-08 DIAGNOSIS — E440 Moderate protein-calorie malnutrition: Secondary | ICD-10-CM

## 2024-10-08 DIAGNOSIS — N183 Chronic kidney disease, stage 3 unspecified: Secondary | ICD-10-CM | POA: Diagnosis not present

## 2024-10-08 DIAGNOSIS — R531 Weakness: Secondary | ICD-10-CM

## 2024-10-08 DIAGNOSIS — Z7189 Other specified counseling: Secondary | ICD-10-CM

## 2024-10-08 DIAGNOSIS — I480 Paroxysmal atrial fibrillation: Secondary | ICD-10-CM

## 2024-10-08 DIAGNOSIS — E1122 Type 2 diabetes mellitus with diabetic chronic kidney disease: Secondary | ICD-10-CM | POA: Diagnosis not present

## 2024-10-08 DIAGNOSIS — N400 Enlarged prostate without lower urinary tract symptoms: Secondary | ICD-10-CM | POA: Diagnosis not present

## 2024-10-08 DIAGNOSIS — E559 Vitamin D deficiency, unspecified: Secondary | ICD-10-CM | POA: Diagnosis not present

## 2024-10-08 DIAGNOSIS — I5020 Unspecified systolic (congestive) heart failure: Secondary | ICD-10-CM

## 2024-10-08 DIAGNOSIS — E1142 Type 2 diabetes mellitus with diabetic polyneuropathy: Secondary | ICD-10-CM

## 2024-10-08 DIAGNOSIS — I358 Other nonrheumatic aortic valve disorders: Secondary | ICD-10-CM

## 2024-10-08 DIAGNOSIS — I10 Essential (primary) hypertension: Secondary | ICD-10-CM

## 2024-10-08 DIAGNOSIS — L02217 Cutaneous abscess of flank: Secondary | ICD-10-CM

## 2024-10-08 NOTE — Assessment & Plan Note (Signed)
 Preventative protocols reviewed and updated unless pt declined. Discussed healthy diet and lifestyle.

## 2024-10-08 NOTE — Progress Notes (Unsigned)
 " Ph: [phone] Fax: [phone]   Patient ID: [name] LULLA Bernardo Mickey., male    DOB: 1941-07-15, 84 y.o.   MRN: [id number]  This visit was conducted in person.  BP (!) 142/78   Pulse 60   Temp 98.1 F (36.7 C) (Oral)   Ht 5' 9 (1.753 m)   Wt 238 lb 6.4 oz (108.1 kg)   SpO2 98%   BMI 35.21 kg/m   BP Readings from Last 3 Encounters:  10/08/24 (!) 142/78  09/02/24 115/66  08/18/24 120/70   CC: CPE Subjective:   HPI: [name] Minahan. is a 84 y.o. male presenting on 10/08/2024 for Annual Exam (Patient accompanied by [name], wife. )   Did not see health advisor this year.  Medicare wellness visit not performed.   No results found.  Flowsheet Row Office Visit from 10/08/2024 in [facility] [place]  PHQ-2 Total Score 0       10/08/2024   12:02 PM 06/25/2024   11:35 AM 05/14/2024   10:28 AM 12/31/2023   12:03 PM 11/05/2023   10:34 AM  Fall Risk   Falls in the past year? 0 0 0 0 0  Number falls in past yr: 0 0 0    Injury with Fall? 0 0  0     Risk for fall due to : Impaired mobility Other (Comment) Other (Comment)    Follow up  Falls evaluation completed Falls evaluation completed       Data saved with a previous flowsheet row definition   S/p 3v CABG and coronary endarterectomy 01/2018 for severe symptomatic CAD.   Hospitalization 11/2021 for ascending cholangitis with bacteremia s/p ERCP with sphincterotomy and gallstone extraction, complicated by afib and MV endocarditis. Received 6 wks of IV antibiotics via PICC line. Underwent subsequent lap chole by Dr Lane 05/2022.    Hospitalization 12/2022 for liver abscess with E coli bacteremia with recurrent R pleural effusion s/p drainage treated with IV ceftriaxone  and oral flagyl  and liver drain placement.  Saw [facility] ID Dr Fayette in f/u for hepatic abscess, liver drain removed 01/23/2023 and PICC line removed 02/01/2023. F/u PRN.    Seeing pulm for chronic pleural effusion. CT 05/2023  showed moderate recurrent R pleural effusion as well as collapse/consolidation to medial RML and posterior RLL, s/p thoracentesis.   Recently completed flank abscess I&D in OR by Dr Tye, recovering well with this.   Farxiga  caused recurrent yeast infection.  Pending outpatient [facility].    Preventative: COLONOSCOPY 12/2013 3 polyps, diverticulosis, rec rpt 3 yrs [name] Young).  COLONOSCOPY 06/2019 - 6 polyps (TA), diverticulosis, f/u left open ended [name] Young) - decided to age out. No blood in stool  Prostate - PSA previously normal. Also on flomax  - aged out  Lung cancer - not eligible  Flu shot - yearly  COVID vaccine - Pfizer 09/2019 x2, booster x2 06/2020, 01/2021  Pneumovax 09/2011, prevnar-13 04/2014, prevnar-20 09/2024 today  Td 09/2011, Tdap 06/2024 RSV 06/2023 Shingrix - discussed, declines  Advanced directive: would want daughter RN to be HCPOA. Saw lawyer and completed. Asked to bring us  copy. Does not want prolonged life support. Pt wants to be DNR/DNI.  Seat belt use discussed  Sunscreen use discussed. No changing moles on skin.  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 Interim medical history since our last visit reviewed. Allergies and medications reviewed and updated. Outpatient Medications Prior to Visit  Medication Sig Dispense Refill   aspirin  EC 81 MG tablet Take 81 mg by mouth daily. Swallow whole.     bisacodyl  (DULCOLAX) 5 MG EC tablet Take 5 mg by mouth at bedtime.     Cholecalciferol  (VITAMIN D3) 25 MCG (1000 UT)  CAPS Take 1 capsule (1,000 Units total) by mouth daily. 30 capsule    Cyanocobalamin  (B-12) 1000 MCG CAPS Take 1 capsule by mouth every Monday, Wednesday, and Friday.     glucose blood (TRUE METRIX BLOOD GLUCOSE TEST) test strip Use as instructed to check blood sugar once a day 100 each 3   Magnesium  Oxide -Mg Supplement 250 MG TABS Take 250 mg by mouth daily.     metoprolol  tartrate (LOPRESSOR ) 25 MG tablet Take 0.5 tablets (12.5 mg total) by mouth 2 (two) times daily. 90 tablet 3   mirtazapine  (REMERON ) 30 MG tablet TAKE 1 TABLET BY MOUTH AT BEDTIME. 90 tablet 2   Multiple Vitamins-Minerals (PRESERVISION AREDS 2 PO) Take 1 capsule by mouth daily.     rosuvastatin  (CRESTOR ) 10 MG tablet TAKE 1 TABLET BY MOUTH EVERY DAY 90 tablet 2   tamsulosin  (FLOMAX ) 0.4 MG CAPS capsule TAKE 1 CAPSULE BY MOUTH EVERY DAY 90 capsule 4   torsemide  (DEMADEX ) 20 MG tablet Take 1 tablet (20 mg total) by mouth every Monday, Wednesday, and Friday. (Patient taking differently: Take 20 mg by mouth daily. Patient takes on Tuesday and Thursday only.)     torsemide  (DEMADEX ) 20 MG tablet Take 1 tablet (20 mg total) by mouth 2 (two) times a week.     oxyCODONE -acetaminophen  (PERCOCET) 5-325 MG tablet Take 1 tablet by mouth every 8 (eight) hours as needed for severe pain (pain score 7-10). 6 tablet 0   No facility-administered medications prior to visit.     Per HPI unless specifically indicated in ROS section below Review of Systems  Constitutional:  Negative for activity change, appetite change, chills, fatigue, fever and unexpected weight change.  HENT:  Negative for hearing loss.   Eyes:  Negative for visual disturbance.  Respiratory:  Negative for cough, chest tightness, shortness of breath and wheezing.   Cardiovascular:  Negative for chest pain, palpitations and leg swelling.  Gastrointestinal:  Negative for abdominal distention, abdominal pain, blood in stool, constipation, diarrhea, nausea and vomiting.   Genitourinary:  Negative for difficulty urinating and hematuria.  Musculoskeletal:  Negative for arthralgias, myalgias and neck pain.  Skin:  Negative for rash.  Neurological:  Negative for dizziness, seizures, syncope and headaches.  Hematological:  Negative for adenopathy. Does not bruise/bleed easily.  Psychiatric/Behavioral:  Negative for dysphoric mood. The patient is not nervous/anxious.     Objective:  BP (!) 142/78   Pulse 60   Temp 98.1 F (36.7 C) (Oral)   Ht 5' 9 (1.753 m)   Wt 238 lb 6.4 oz (108.1 kg)   SpO2 98%   BMI 35.21 kg/m   Wt Readings from Last 3 Encounters:  10/08/24 238 lb 6.4 oz (108.1 kg)  08/18/24 230 lb 9.6 oz (104.6 kg)  08/15/24 232 lb 9.6 oz (105.5 kg)      Physical Exam Vitals and nursing note reviewed.  Constitutional:      General: He is not in acute distress.    Appearance: Normal appearance. He is well-developed. He is not ill-appearing.  HENT:     Head: Normocephalic and atraumatic.     Right Ear: Hearing, tympanic membrane, ear canal and external ear normal.     Left Ear: Hearing, tympanic membrane, ear canal and external ear normal.     Mouth/Throat:     Mouth: Mucous membranes are moist.     Pharynx: Oropharynx is clear. No oropharyngeal exudate or posterior oropharyngeal erythema.  Eyes:     General: No scleral icterus.    Extraocular Movements: Extraocular movements intact.     Conjunctiva/sclera: Conjunctivae normal.     Pupils: Pupils are equal, round, and reactive to light.  Neck:     Thyroid : No thyroid  mass or thyromegaly.     Vascular: No carotid bruit.  Cardiovascular:     Rate and Rhythm: Normal rate and regular rhythm.     Pulses: Normal pulses.          Radial pulses are 2+ on the right side and 2+ on the left side.     Heart sounds: Murmur (3/6 systolic) heard.  Pulmonary:     Effort: Pulmonary effort is normal. No respiratory distress.     Breath sounds: Normal breath sounds. No wheezing, rhonchi or rales.   Abdominal:     General: Bowel sounds are normal. There is no distension.     Palpations: Abdomen is soft. There is no mass.     Tenderness: There is no abdominal tenderness. There is no guarding or rebound.     Hernia: No hernia is present.  Musculoskeletal:        General: Normal range of motion.     Cervical back: Normal range of motion and neck supple.     Right lower leg: No edema.     Left lower leg: No edema.  Lymphadenopathy:     Cervical: No cervical adenopathy.  Skin:    General: Skin is warm and dry.     Findings: No rash.  Neurological:     General: No focal deficit present.     Mental Status: He is alert and oriented to person, place, and time.  Psychiatric:        Mood and Affect: Mood normal.        Behavior: Behavior normal.        Thought Content: Thought content normal.        Judgment: Judgment normal.       Results for orders placed or performed in visit on 10/01/24  T4, free   Collection Time: 10/01/24  8:01 AM  Result Value Ref Range   Free T4 0.67 0.60 - 1.60 ng/dL  TSH   Collection Time: 10/01/24  8:01 AM  Result Value Ref Range   TSH 12.75 (H) 0.35 - 5.50 uIU/mL  Vitamin B12   Collection Time: 10/01/24  8:01 AM  Result Value Ref Range   Vitamin B-12 400 211 - 911 pg/mL  CBC with Differential/Platelet   Collection Time: 10/01/24  8:01 AM  Result Value Ref Range   WBC 5.8 4.0 - 10.5 K/uL   RBC 4.12 (L) 4.22 - 5.81 Mil/uL   Hemoglobin 11.8 (L) 13.0 - 17.0 g/dL   HCT 63.5 (L) 60.9 - 47.9 %   MCV 88.4  78.0 - 100.0 fl   MCHC 32.5 30.0 - 36.0 g/dL   RDW 81.4 (H) 88.4 - 84.4 %   Platelets 188.0 150.0 - 400.0 K/uL   Neutrophils Relative % 59.7 43.0 - 77.0 %   Lymphocytes Relative 30.2 12.0 - 46.0 %   Monocytes Relative 6.4 3.0 - 12.0 %   Eosinophils Relative 3.3 0.0 - 5.0 %   Basophils Relative 0.4 0.0 - 3.0 %   Neutro Abs 3.5 1.4 - 7.7 K/uL   Lymphs Abs 1.7 0.7 - 4.0 K/uL   Monocytes Absolute 0.4 0.1 - 1.0 K/uL   Eosinophils Absolute 0.2 0.0  - 0.7 K/uL   Basophils Absolute 0.0 0.0 - 0.1 K/uL  Parathyroid  hormone, intact (no Ca)   Collection Time: 10/01/24  8:01 AM  Result Value Ref Range   PTH 31 16 - 77 pg/mL  Microalbumin / creatinine urine ratio   Collection Time: 10/01/24  8:01 AM  Result Value Ref Range   Microalb, Ur 1.3 0.7 - 1.9 mg/dL   Creatinine,U 878.6 mg/dL   Microalb Creat Ratio 10.7 0.0 - 30.0 mg/g  VITAMIN D  25 Hydroxy (Vit-D Deficiency, Fractures)   Collection Time: 10/01/24  8:01 AM  Result Value Ref Range   VITD 36.80 30.00 - 100.00 ng/mL  Phosphorus   Collection Time: 10/01/24  8:01 AM  Result Value Ref Range   Phosphorus 4.0 2.3 - 4.6 mg/dL  Comprehensive metabolic panel with GFR   Collection Time: 10/01/24  8:01 AM  Result Value Ref Range   Sodium 142 135 - 145 mEq/L   Potassium 4.5 3.5 - 5.1 mEq/L   Chloride 104 96 - 112 mEq/L   CO2 32 19 - 32 mEq/L   Glucose, Bld 114 (H) 70 - 99 mg/dL   BUN 24 (H) 6 - 23 mg/dL   Creatinine, Ser 8.80 0.40 - 1.50 mg/dL   Total Bilirubin 0.5 0.2 - 1.2 mg/dL   Alkaline Phosphatase 65 39 - 117 U/L   AST 17 5 - 37 U/L   ALT 15 3 - 53 U/L   Total Protein 7.2 6.0 - 8.3 g/dL   Albumin  4.0 3.5 - 5.2 g/dL   GFR 43.01 (L) >39.99 mL/min   Calcium  9.0 8.4 - 10.5 mg/dL  Lipid panel   Collection Time: 10/01/24  8:01 AM  Result Value Ref Range   Cholesterol 127 28 - 200 mg/dL   Triglycerides 687.9 (H) 10.0 - 149.0 mg/dL   HDL 70.49 (L) >60.99 mg/dL   VLDL 37.5 (H) 0.0 - 59.9 mg/dL   LDL Cholesterol 36 10 - 99 mg/dL   Total CHOL/HDL Ratio 4    NonHDL 97.96   Hemoglobin A1c   Collection Time: 10/01/24  8:01 AM  Result Value Ref Range   Hgb A1c MFr Bld 5.7 4.6 - 6.5 %    Assessment & Plan:   Problem List Items Addressed This Visit     Advanced care planning/counseling discussion (Chronic)   Previously discussed       Encounter for general adult medical examination with abnormal findings - Primary (Chronic)   Preventative protocols reviewed and updated  unless pt declined. Discussed healthy diet and lifestyle.       Type 2 diabetes mellitus with other specified complication (HCC)   Chronic, great control off medication since losing weight - continue to monitor this.  Farxiga  caused recurrent yeast infection  Diabetes associated with obesity, hypertension, hyperlipidemia.       Dyslipidemia associated with type 2  diabetes mellitus (HCC)   Chronic, on rosuvastatin  10mg  nighlty. LDL at goal however triglycerides markedly elevated - reviewed diet choices to improve triglyceride control.  The ASCVD Risk score (Arnett DK, et al., 2019) failed to calculate for the following reasons:   The 2019 ASCVD risk score is only valid for ages 55 to 47   Risk score cannot be calculated because patient has a medical history suggesting prior/existing ASCVD   * - Cholesterol units were assumed       CKD stage 3 due to type 2 diabetes mellitus (HCC)   Latest GFR 50s, stable.       Benign prostatic hyperplasia   Continue flomax .       Severe obesity (BMI 35.0-39.9) with comorbidity (HCC)   Weight gain noted.  Consider discontinuing remeron  as per above.       Coronary artery disease   Continue aspirin , rosuvastatin        Relevant Medications   torsemide  (DEMADEX ) 20 MG tablet   S/P CABG x 3   Continue aspirin , rosuvastatin .       PAF (paroxysmal atrial fibrillation) (HCC)   Heart sounds regular today. Remaining off eliquis  due to h/o recurrent rectal bleed early 2025.  Continues low dose metoprolol  12.5mg  bid.  Consider heart monitor to eval for afib burden, consider restarting eliquis  given elevated CHADSVASC2 score of 6 (9.7% yearly risk of stroke)      Relevant Medications   torsemide  (DEMADEX ) 20 MG tablet   Diabetic neuropathy (HCC)   Vitamin B12 deficiency   Chronic, stable on vit B12 1000mcg MWF - continue       Vitamin D  deficiency   Chronic, stable on vit D 1000 units daily.       Recurrent pleural effusion on right    Reassuring lung exam with clearing of breath sounds to RLL - continue to monitor.       Subclinical hypothyroidism   TSH higher to 12, fT4 low normal.  Denies significant hypothyroidism symptoms besides fatigue.  Discussed trial thyroid  replacement vs rechecking levels in another several weeks - they opt to recheck thyroid  levels - ordered. Will add reverse T3 to eval for sick euthyroid as cause.       Relevant Orders   TSH   T3   T4, free   T3, reverse   Aortic valve sclerosis   Relevant Medications   torsemide  (DEMADEX ) 20 MG tablet   Insomnia   Has been on remeron  30mg  nightly - consider slow taper if he's been sleeping well.       Heart failure with recovered ejection fraction (HFrecEF) (HCC)   He continues torsemide  20mg  twice weekly with good effect. Continue metoprolol  as well.      Relevant Medications   torsemide  (DEMADEX ) 20 MG tablet   General weakness   Continues slowly improving. P[facility] outpatient [facility]       Essential hypertension   Chronic, stable on current regimen, but noting BPs trending up - continue to monitor.      Relevant Medications   torsemide  (DEMADEX ) 20 MG tablet   Malnutrition of moderate degree   Protein levels back to normal - will resolve this problem.  He has been on nightly remeron  - consider discontinuing if sleeping well.       Abscess of flank   S/p flank abscess I&D by Dr Tye, recovered well from this. Continue to monitor.       Other Visit Diagnoses  Need for pneumococcal vaccination       Relevant Orders   Pneumococcal conjugate vaccine 20-valent (Prevnar 20) (Completed)        No orders of the defined types were placed in this encounter.   Orders Placed This Encounter  Procedures   Pneumococcal conjugate vaccine 20-valent (Prevnar 20)   TSH    Standing Status:   Future    Expiration Date:   10/09/2025   T3    Standing Status:   Future    Expiration Date:   10/09/2025   T4, free    Standing  Status:   Future    Expiration Date:   10/09/2025   T3, reverse    Standing Status:   Future    Expiration Date:   10/09/2025    Patient Instructions  Prevnar-20 today  Continue current medicines  Good to see you today  Return as needed or in 6 months for follow up visit.  Schedule nonfasting lab visit in 3-4 weeks to repeat thyroid  function tests.  Let us  know if symptoms of low thyroid  develop or worsen - fatigue, weight gain, constipation.   Follow up plan: Return in about 6 months (around 04/07/2025) for follow up visit.  [name] Blas, MD   "

## 2024-10-08 NOTE — Assessment & Plan Note (Signed)
"  Previously discussed    "

## 2024-10-08 NOTE — Patient Instructions (Addendum)
 Prevnar-20 today  Continue current medicines  Good to see you today  Return as needed or in 6 months for follow up visit.  Schedule nonfasting lab visit in 3-4 weeks to repeat thyroid  function tests.  Let us  know if symptoms of low thyroid  develop or worsen - fatigue, weight gain, constipation.

## 2024-10-09 ENCOUNTER — Encounter: Payer: Self-pay | Admitting: Family Medicine

## 2024-10-09 NOTE — Assessment & Plan Note (Signed)
 Latest GFR 50s, stable.

## 2024-10-09 NOTE — Assessment & Plan Note (Addendum)
 TSH higher to 12, fT4 low normal.  Denies significant hypothyroidism symptoms besides fatigue.  Discussed trial thyroid  replacement vs rechecking levels in another several weeks - they opt to recheck thyroid  levels - ordered. Will add reverse T3 to eval for sick euthyroid as cause.

## 2024-10-09 NOTE — Assessment & Plan Note (Signed)
 S/p flank abscess I&D by Dr Tye, recovered well from this. Continue to monitor.

## 2024-10-09 NOTE — Assessment & Plan Note (Signed)
 Continue aspirin, rosuvastatin

## 2024-10-09 NOTE — Assessment & Plan Note (Signed)
 Has been on remeron  30mg  nightly - consider slow taper if he's been sleeping well.

## 2024-10-09 NOTE — Assessment & Plan Note (Signed)
 Chronic, stable on vit D 1000 units daily.

## 2024-10-09 NOTE — Assessment & Plan Note (Addendum)
 Chronic, great control off medication since losing weight - continue to monitor this.  Farxiga  caused recurrent yeast infection  Diabetes associated with obesity, hypertension, hyperlipidemia.

## 2024-10-09 NOTE — Assessment & Plan Note (Signed)
 Chronic, stable on vit B12 1000mcg MWF - continue

## 2024-10-09 NOTE — Assessment & Plan Note (Signed)
 He continues torsemide  20mg  twice weekly with good effect. Continue metoprolol  as well.

## 2024-10-09 NOTE — Assessment & Plan Note (Signed)
 Heart sounds regular today. Remaining off eliquis  due to h/o recurrent rectal bleed early 2025.  Continues low dose metoprolol  12.5mg  bid.  Consider heart monitor to eval for afib burden, consider restarting eliquis  given elevated CHADSVASC2 score of 6 (9.7% yearly risk of stroke)

## 2024-10-09 NOTE — Assessment & Plan Note (Signed)
 Protein levels back to normal - will resolve this problem.  He has been on nightly remeron  - consider discontinuing if sleeping well.

## 2024-10-09 NOTE — Assessment & Plan Note (Signed)
 Reassuring lung exam with clearing of breath sounds to RLL - continue to monitor.

## 2024-10-09 NOTE — Assessment & Plan Note (Signed)
 Chronic, stable on current regimen, but noting BPs trending up - continue to monitor.

## 2024-10-09 NOTE — Assessment & Plan Note (Addendum)
 Continue aspirin, rosuvastatin

## 2024-10-09 NOTE — Assessment & Plan Note (Signed)
 Continues slowly improving. Pending outpatient PT at [facility]

## 2024-10-09 NOTE — Assessment & Plan Note (Signed)
 Chronic, on rosuvastatin  10mg  nighlty. LDL at goal however triglycerides markedly elevated - reviewed diet choices to improve triglyceride control.  The ASCVD Risk score (Arnett DK, et al., 2019) failed to calculate for the following reasons:   The 2019 ASCVD risk score is only valid for ages 31 to 69   Risk score cannot be calculated because patient has a medical history suggesting prior/existing ASCVD   * - Cholesterol units were assumed

## 2024-10-09 NOTE — Assessment & Plan Note (Addendum)
 Continue flomax 

## 2024-10-09 NOTE — Assessment & Plan Note (Signed)
 Weight gain noted.  Consider discontinuing remeron  as per above.

## 2024-10-29 ENCOUNTER — Other Ambulatory Visit

## 2024-11-19 ENCOUNTER — Ambulatory Visit: Admitting: Internal Medicine

## 2024-11-20 ENCOUNTER — Ambulatory Visit: Admitting: Podiatry

## 2025-04-07 ENCOUNTER — Ambulatory Visit: Admitting: Family Medicine
